# Patient Record
Sex: Male | Born: 1965
Health system: Southern US, Community
[De-identification: ages and names within clinical notes are randomized; demographics above are authoritative.]

## PROBLEM LIST (undated history)

## (undated) DIAGNOSIS — I10 Essential (primary) hypertension: Secondary | ICD-10-CM

## (undated) DIAGNOSIS — M545 Low back pain, unspecified: Secondary | ICD-10-CM

## (undated) DIAGNOSIS — G8929 Other chronic pain: Secondary | ICD-10-CM

## (undated) DIAGNOSIS — M6282 Rhabdomyolysis: Secondary | ICD-10-CM

## (undated) DIAGNOSIS — C801 Malignant (primary) neoplasm, unspecified: Secondary | ICD-10-CM

## (undated) DIAGNOSIS — F329 Major depressive disorder, single episode, unspecified: Secondary | ICD-10-CM

## (undated) DIAGNOSIS — F419 Anxiety disorder, unspecified: Secondary | ICD-10-CM

## (undated) DIAGNOSIS — G4730 Sleep apnea, unspecified: Secondary | ICD-10-CM

## (undated) DIAGNOSIS — K219 Gastro-esophageal reflux disease without esophagitis: Secondary | ICD-10-CM

## (undated) DIAGNOSIS — J449 Chronic obstructive pulmonary disease, unspecified: Secondary | ICD-10-CM

## (undated) DIAGNOSIS — I2510 Atherosclerotic heart disease of native coronary artery without angina pectoris: Secondary | ICD-10-CM

## (undated) DIAGNOSIS — E119 Type 2 diabetes mellitus without complications: Secondary | ICD-10-CM

## (undated) DIAGNOSIS — E785 Hyperlipidemia, unspecified: Secondary | ICD-10-CM

## (undated) DIAGNOSIS — F32A Depression, unspecified: Secondary | ICD-10-CM

## (undated) DIAGNOSIS — M79606 Pain in leg, unspecified: Secondary | ICD-10-CM

## (undated) HISTORY — PX: BACK SURGERY: SHX140

## (undated) HISTORY — DX: Rhabdomyolysis: M62.82

## (undated) HISTORY — PX: COLONOSCOPY W/ POLYPECTOMY: SHX1380

## (undated) HISTORY — PX: EXCISIONAL HEMORRHOIDECTOMY: SHX1541

---

## 1999-04-27 ENCOUNTER — Inpatient Hospital Stay (HOSPITAL_COMMUNITY): Admission: EM | Admit: 1999-04-27 | Discharge: 1999-04-27 | Payer: Self-pay | Admitting: Emergency Medicine

## 2001-10-09 ENCOUNTER — Encounter: Payer: Self-pay | Admitting: Emergency Medicine

## 2001-10-09 ENCOUNTER — Inpatient Hospital Stay (HOSPITAL_COMMUNITY): Admission: EM | Admit: 2001-10-09 | Discharge: 2001-10-10 | Payer: Self-pay | Admitting: Emergency Medicine

## 2001-10-09 HISTORY — PX: CORONARY ANGIOPLASTY WITH STENT PLACEMENT: SHX49

## 2001-12-11 ENCOUNTER — Inpatient Hospital Stay (HOSPITAL_COMMUNITY): Admission: EM | Admit: 2001-12-11 | Discharge: 2001-12-14 | Payer: Self-pay

## 2001-12-13 HISTORY — PX: CORONARY ANGIOPLASTY WITH STENT PLACEMENT: SHX49

## 2002-01-15 ENCOUNTER — Emergency Department (HOSPITAL_COMMUNITY): Admission: EM | Admit: 2002-01-15 | Discharge: 2002-01-15 | Payer: Self-pay | Admitting: Emergency Medicine

## 2002-02-10 ENCOUNTER — Encounter: Payer: Self-pay | Admitting: *Deleted

## 2002-02-11 ENCOUNTER — Inpatient Hospital Stay (HOSPITAL_COMMUNITY): Admission: EM | Admit: 2002-02-11 | Discharge: 2002-02-13 | Payer: Self-pay

## 2002-03-04 ENCOUNTER — Encounter: Payer: Self-pay | Admitting: Emergency Medicine

## 2002-03-04 ENCOUNTER — Inpatient Hospital Stay (HOSPITAL_COMMUNITY): Admission: EM | Admit: 2002-03-04 | Discharge: 2002-03-06 | Payer: Self-pay | Admitting: Emergency Medicine

## 2002-05-03 ENCOUNTER — Encounter: Payer: Self-pay | Admitting: Internal Medicine

## 2002-05-03 ENCOUNTER — Encounter: Admission: RE | Admit: 2002-05-03 | Discharge: 2002-05-03 | Payer: Self-pay | Admitting: Internal Medicine

## 2002-09-05 ENCOUNTER — Emergency Department (HOSPITAL_COMMUNITY): Admission: EM | Admit: 2002-09-05 | Discharge: 2002-09-05 | Payer: Self-pay | Admitting: Emergency Medicine

## 2003-03-19 ENCOUNTER — Inpatient Hospital Stay (HOSPITAL_COMMUNITY): Admission: EM | Admit: 2003-03-19 | Discharge: 2003-03-20 | Payer: Self-pay | Admitting: Emergency Medicine

## 2003-07-09 ENCOUNTER — Emergency Department (HOSPITAL_COMMUNITY): Admission: EM | Admit: 2003-07-09 | Discharge: 2003-07-09 | Payer: Self-pay | Admitting: Emergency Medicine

## 2003-09-10 ENCOUNTER — Emergency Department (HOSPITAL_COMMUNITY): Admission: EM | Admit: 2003-09-10 | Discharge: 2003-09-11 | Payer: Self-pay | Admitting: Emergency Medicine

## 2003-10-03 ENCOUNTER — Ambulatory Visit (HOSPITAL_COMMUNITY): Admission: RE | Admit: 2003-10-03 | Discharge: 2003-10-03 | Payer: Self-pay | Admitting: *Deleted

## 2003-10-10 ENCOUNTER — Ambulatory Visit (HOSPITAL_COMMUNITY): Admission: RE | Admit: 2003-10-10 | Discharge: 2003-10-11 | Payer: Self-pay | Admitting: *Deleted

## 2003-10-10 HISTORY — PX: CORONARY ANGIOPLASTY WITH STENT PLACEMENT: SHX49

## 2003-11-05 ENCOUNTER — Emergency Department (HOSPITAL_COMMUNITY): Admission: AD | Admit: 2003-11-05 | Discharge: 2003-11-05 | Payer: Self-pay | Admitting: Family Medicine

## 2003-12-20 ENCOUNTER — Emergency Department (HOSPITAL_COMMUNITY): Admission: EM | Admit: 2003-12-20 | Discharge: 2003-12-20 | Payer: Self-pay | Admitting: Emergency Medicine

## 2003-12-24 ENCOUNTER — Emergency Department (HOSPITAL_COMMUNITY): Admission: EM | Admit: 2003-12-24 | Discharge: 2003-12-25 | Payer: Self-pay | Admitting: Emergency Medicine

## 2004-04-24 ENCOUNTER — Emergency Department (HOSPITAL_COMMUNITY): Admission: EM | Admit: 2004-04-24 | Discharge: 2004-04-24 | Payer: Self-pay | Admitting: Emergency Medicine

## 2004-07-16 ENCOUNTER — Emergency Department (HOSPITAL_COMMUNITY): Admission: EM | Admit: 2004-07-16 | Discharge: 2004-07-17 | Payer: Self-pay | Admitting: *Deleted

## 2004-08-13 ENCOUNTER — Encounter (INDEPENDENT_AMBULATORY_CARE_PROVIDER_SITE_OTHER): Payer: Self-pay | Admitting: *Deleted

## 2004-08-13 ENCOUNTER — Ambulatory Visit (HOSPITAL_COMMUNITY): Admission: RE | Admit: 2004-08-13 | Discharge: 2004-08-13 | Payer: Self-pay | Admitting: Surgery

## 2004-08-22 ENCOUNTER — Emergency Department (HOSPITAL_COMMUNITY): Admission: EM | Admit: 2004-08-22 | Discharge: 2004-08-22 | Payer: Self-pay | Admitting: Emergency Medicine

## 2004-09-16 ENCOUNTER — Ambulatory Visit: Payer: Self-pay | Admitting: Endocrinology

## 2004-12-07 ENCOUNTER — Emergency Department (HOSPITAL_COMMUNITY): Admission: EM | Admit: 2004-12-07 | Discharge: 2004-12-07 | Payer: Self-pay | Admitting: Emergency Medicine

## 2004-12-10 ENCOUNTER — Ambulatory Visit: Payer: Self-pay | Admitting: Cardiology

## 2004-12-12 ENCOUNTER — Emergency Department (HOSPITAL_COMMUNITY): Admission: EM | Admit: 2004-12-12 | Discharge: 2004-12-12 | Payer: Self-pay | Admitting: Emergency Medicine

## 2005-01-11 ENCOUNTER — Ambulatory Visit: Payer: Self-pay | Admitting: Cardiology

## 2005-04-26 ENCOUNTER — Ambulatory Visit: Payer: Self-pay | Admitting: Endocrinology

## 2005-06-16 ENCOUNTER — Ambulatory Visit: Payer: Self-pay | Admitting: Cardiology

## 2005-09-13 ENCOUNTER — Ambulatory Visit: Payer: Self-pay | Admitting: Cardiology

## 2005-09-13 ENCOUNTER — Emergency Department (HOSPITAL_COMMUNITY): Admission: EM | Admit: 2005-09-13 | Discharge: 2005-09-13 | Payer: Self-pay | Admitting: Emergency Medicine

## 2005-11-18 ENCOUNTER — Ambulatory Visit: Payer: Self-pay | Admitting: Endocrinology

## 2006-01-12 ENCOUNTER — Ambulatory Visit: Payer: Self-pay | Admitting: Cardiology

## 2006-02-08 ENCOUNTER — Ambulatory Visit: Payer: Self-pay | Admitting: Endocrinology

## 2006-02-14 ENCOUNTER — Ambulatory Visit: Payer: Self-pay | Admitting: Endocrinology

## 2006-03-03 ENCOUNTER — Emergency Department (HOSPITAL_COMMUNITY): Admission: EM | Admit: 2006-03-03 | Discharge: 2006-03-03 | Payer: Self-pay | Admitting: Emergency Medicine

## 2006-04-19 ENCOUNTER — Emergency Department (HOSPITAL_COMMUNITY): Admission: EM | Admit: 2006-04-19 | Discharge: 2006-04-19 | Payer: Self-pay | Admitting: Emergency Medicine

## 2006-05-17 ENCOUNTER — Emergency Department (HOSPITAL_COMMUNITY): Admission: EM | Admit: 2006-05-17 | Discharge: 2006-05-17 | Payer: Self-pay | Admitting: Family Medicine

## 2006-05-26 ENCOUNTER — Ambulatory Visit: Payer: Self-pay

## 2006-09-06 ENCOUNTER — Emergency Department (HOSPITAL_COMMUNITY): Admission: EM | Admit: 2006-09-06 | Discharge: 2006-09-06 | Payer: Self-pay | Admitting: Family Medicine

## 2006-09-08 ENCOUNTER — Ambulatory Visit: Payer: Self-pay | Admitting: Endocrinology

## 2006-09-21 ENCOUNTER — Ambulatory Visit: Payer: Self-pay | Admitting: Cardiology

## 2006-09-29 ENCOUNTER — Emergency Department (HOSPITAL_COMMUNITY): Admission: EM | Admit: 2006-09-29 | Discharge: 2006-09-29 | Payer: Self-pay | Admitting: Family Medicine

## 2006-12-18 ENCOUNTER — Emergency Department (HOSPITAL_COMMUNITY): Admission: EM | Admit: 2006-12-18 | Discharge: 2006-12-18 | Payer: Self-pay | Admitting: Emergency Medicine

## 2006-12-18 ENCOUNTER — Ambulatory Visit: Payer: Self-pay | Admitting: *Deleted

## 2006-12-21 ENCOUNTER — Ambulatory Visit: Payer: Self-pay | Admitting: Internal Medicine

## 2006-12-21 LAB — CONVERTED CEMR LAB
Creatinine, Ser: 0.9 mg/dL (ref 0.4–1.5)
GFR calc non Af Amer: 99 mL/min
Glucose, Bld: 106 mg/dL — ABNORMAL HIGH (ref 70–99)
Hgb A1c MFr Bld: 6.3 % — ABNORMAL HIGH (ref 4.6–6.0)
LDL Cholesterol: 75 mg/dL (ref 0–99)
Potassium: 3.6 meq/L (ref 3.5–5.1)
Sodium: 140 meq/L (ref 135–145)
VLDL: 16 mg/dL (ref 0–40)

## 2007-01-26 ENCOUNTER — Ambulatory Visit: Payer: Self-pay | Admitting: Endocrinology

## 2007-03-10 ENCOUNTER — Ambulatory Visit: Payer: Self-pay | Admitting: Cardiology

## 2007-05-08 ENCOUNTER — Ambulatory Visit: Payer: Self-pay | Admitting: Cardiology

## 2007-05-24 ENCOUNTER — Ambulatory Visit: Payer: Self-pay | Admitting: Internal Medicine

## 2007-08-08 ENCOUNTER — Ambulatory Visit: Payer: Self-pay | Admitting: Internal Medicine

## 2007-08-15 ENCOUNTER — Ambulatory Visit: Payer: Self-pay

## 2007-11-07 ENCOUNTER — Telehealth: Payer: Self-pay | Admitting: Internal Medicine

## 2007-12-13 ENCOUNTER — Emergency Department (HOSPITAL_COMMUNITY): Admission: EM | Admit: 2007-12-13 | Discharge: 2007-12-13 | Payer: Self-pay | Admitting: Family Medicine

## 2008-01-01 ENCOUNTER — Emergency Department (HOSPITAL_COMMUNITY): Admission: EM | Admit: 2008-01-01 | Discharge: 2008-01-01 | Payer: Self-pay | Admitting: Emergency Medicine

## 2008-01-10 ENCOUNTER — Telehealth: Payer: Self-pay | Admitting: Endocrinology

## 2008-01-30 ENCOUNTER — Encounter (INDEPENDENT_AMBULATORY_CARE_PROVIDER_SITE_OTHER): Payer: Self-pay | Admitting: *Deleted

## 2008-01-30 ENCOUNTER — Ambulatory Visit: Payer: Self-pay | Admitting: Endocrinology

## 2008-01-30 DIAGNOSIS — E119 Type 2 diabetes mellitus without complications: Secondary | ICD-10-CM | POA: Insufficient documentation

## 2008-01-30 DIAGNOSIS — K625 Hemorrhage of anus and rectum: Secondary | ICD-10-CM

## 2008-01-31 LAB — CONVERTED CEMR LAB: Hgb A1c MFr Bld: 6.4 % — ABNORMAL HIGH (ref 4.6–6.0)

## 2008-02-13 ENCOUNTER — Encounter: Payer: Self-pay | Admitting: Endocrinology

## 2008-02-19 ENCOUNTER — Ambulatory Visit: Payer: Self-pay | Admitting: Internal Medicine

## 2008-02-22 ENCOUNTER — Ambulatory Visit: Payer: Self-pay | Admitting: Internal Medicine

## 2008-03-06 ENCOUNTER — Emergency Department (HOSPITAL_COMMUNITY): Admission: EM | Admit: 2008-03-06 | Discharge: 2008-03-06 | Payer: Self-pay | Admitting: Emergency Medicine

## 2008-04-01 ENCOUNTER — Telehealth (INDEPENDENT_AMBULATORY_CARE_PROVIDER_SITE_OTHER): Payer: Self-pay | Admitting: *Deleted

## 2008-04-17 ENCOUNTER — Encounter: Payer: Self-pay | Admitting: Endocrinology

## 2008-04-18 DIAGNOSIS — Z872 Personal history of diseases of the skin and subcutaneous tissue: Secondary | ICD-10-CM | POA: Insufficient documentation

## 2008-04-18 DIAGNOSIS — I10 Essential (primary) hypertension: Secondary | ICD-10-CM

## 2008-04-18 DIAGNOSIS — E669 Obesity, unspecified: Secondary | ICD-10-CM

## 2008-04-18 DIAGNOSIS — E7800 Pure hypercholesterolemia, unspecified: Secondary | ICD-10-CM

## 2008-04-19 ENCOUNTER — Encounter: Payer: Self-pay | Admitting: Internal Medicine

## 2008-04-21 ENCOUNTER — Emergency Department (HOSPITAL_COMMUNITY): Admission: EM | Admit: 2008-04-21 | Discharge: 2008-04-21 | Payer: Self-pay | Admitting: Emergency Medicine

## 2008-04-24 ENCOUNTER — Encounter: Payer: Self-pay | Admitting: Internal Medicine

## 2008-04-25 ENCOUNTER — Ambulatory Visit: Payer: Self-pay | Admitting: Internal Medicine

## 2008-04-25 DIAGNOSIS — R079 Chest pain, unspecified: Secondary | ICD-10-CM | POA: Insufficient documentation

## 2008-04-25 DIAGNOSIS — M549 Dorsalgia, unspecified: Secondary | ICD-10-CM

## 2008-04-25 DIAGNOSIS — J309 Allergic rhinitis, unspecified: Secondary | ICD-10-CM | POA: Insufficient documentation

## 2008-04-25 DIAGNOSIS — Z860100 Personal history of colon polyps, unspecified: Secondary | ICD-10-CM | POA: Insufficient documentation

## 2008-04-25 DIAGNOSIS — I252 Old myocardial infarction: Secondary | ICD-10-CM

## 2008-04-25 DIAGNOSIS — E785 Hyperlipidemia, unspecified: Secondary | ICD-10-CM | POA: Insufficient documentation

## 2008-05-08 ENCOUNTER — Telehealth: Payer: Self-pay | Admitting: Internal Medicine

## 2008-05-09 ENCOUNTER — Encounter: Payer: Self-pay | Admitting: Internal Medicine

## 2008-05-13 ENCOUNTER — Ambulatory Visit: Payer: Self-pay | Admitting: Internal Medicine

## 2008-05-13 LAB — CONVERTED CEMR LAB
Albumin: 3.6 g/dL (ref 3.5–5.2)
Alkaline Phosphatase: 61 units/L (ref 39–117)
Amylase: 72 units/L (ref 27–131)
Bilirubin, Direct: 0.1 mg/dL (ref 0.0–0.3)
HDL: 25.5 mg/dL — ABNORMAL LOW (ref >39.0)
INR: 1.1 — ABNORMAL HIGH (ref 0.8–1.0)
LDL Cholesterol: 73 mg/dL (ref 0–99)
Lipase: 17 units/L (ref 11.0–59.0)
MCHC: 33.5 g/dL (ref 30.0–36.0)
MCV: 79.1 fL (ref 78.0–100.0)
Platelets: 272 10*3/uL (ref 150–400)
Prothrombin Time: 12.7 s (ref 10.9–13.3)
Total Bilirubin: 0.5 mg/dL (ref 0.3–1.2)
Total CHOL/HDL Ratio: 4.6
Total Protein: 7.4 g/dL (ref 6.0–8.3)
Triglycerides: 96 mg/dL (ref 0–149)

## 2008-06-04 ENCOUNTER — Emergency Department (HOSPITAL_COMMUNITY): Admission: EM | Admit: 2008-06-04 | Discharge: 2008-06-04 | Payer: Self-pay | Admitting: Emergency Medicine

## 2008-06-12 ENCOUNTER — Ambulatory Visit: Payer: Self-pay | Admitting: Internal Medicine

## 2008-06-12 LAB — CONVERTED CEMR LAB
BUN: 10 mg/dL (ref 6–23)
Basophils Absolute: 0.1 10*3/uL (ref 0.0–0.1)
Basophils Relative: 0.8 % (ref 0.0–3.0)
CO2: 28 meq/L (ref 19–32)
Eosinophils Absolute: 0.2 10*3/uL (ref 0.0–0.7)
GFR calc Af Amer: 120 mL/min
GFR calc non Af Amer: 99 mL/min
HCT: 40.2 % (ref 39.0–52.0)
Hemoglobin: 13.4 g/dL (ref 13.0–17.0)
INR: 1 (ref 0.8–1.0)
Monocytes Absolute: 0.7 10*3/uL (ref 0.1–1.0)
Monocytes Relative: 9.8 % (ref 3.0–12.0)
Neutrophils Relative %: 51.2 % (ref 43.0–77.0)
Potassium: 3.5 meq/L (ref 3.5–5.1)
Prothrombin Time: 12.4 s (ref 10.9–13.3)
RBC: 4.98 M/uL (ref 4.22–5.81)
RDW: 13.2 % (ref 11.5–14.6)
Sodium: 142 meq/L (ref 135–145)

## 2008-07-04 ENCOUNTER — Ambulatory Visit: Payer: Self-pay | Admitting: Internal Medicine

## 2008-08-12 ENCOUNTER — Emergency Department (HOSPITAL_COMMUNITY): Admission: EM | Admit: 2008-08-12 | Discharge: 2008-08-12 | Payer: Self-pay | Admitting: Emergency Medicine

## 2008-08-19 ENCOUNTER — Ambulatory Visit: Payer: Self-pay | Admitting: Internal Medicine

## 2008-08-19 DIAGNOSIS — M5010 Cervical disc disorder with radiculopathy, unspecified cervical region: Secondary | ICD-10-CM

## 2008-08-20 ENCOUNTER — Telehealth (INDEPENDENT_AMBULATORY_CARE_PROVIDER_SITE_OTHER): Payer: Self-pay | Admitting: *Deleted

## 2008-09-05 ENCOUNTER — Telehealth (INDEPENDENT_AMBULATORY_CARE_PROVIDER_SITE_OTHER): Payer: Self-pay | Admitting: *Deleted

## 2008-09-05 ENCOUNTER — Encounter: Payer: Self-pay | Admitting: Internal Medicine

## 2008-09-05 ENCOUNTER — Ambulatory Visit: Payer: Self-pay | Admitting: Internal Medicine

## 2008-09-05 DIAGNOSIS — F419 Anxiety disorder, unspecified: Secondary | ICD-10-CM

## 2008-09-05 DIAGNOSIS — F329 Major depressive disorder, single episode, unspecified: Secondary | ICD-10-CM

## 2008-09-05 DIAGNOSIS — K219 Gastro-esophageal reflux disease without esophagitis: Secondary | ICD-10-CM

## 2008-09-05 DIAGNOSIS — G609 Hereditary and idiopathic neuropathy, unspecified: Secondary | ICD-10-CM

## 2008-09-12 ENCOUNTER — Telehealth (INDEPENDENT_AMBULATORY_CARE_PROVIDER_SITE_OTHER): Payer: Self-pay | Admitting: *Deleted

## 2008-09-18 ENCOUNTER — Ambulatory Visit: Payer: Self-pay | Admitting: Internal Medicine

## 2008-09-24 ENCOUNTER — Telehealth (INDEPENDENT_AMBULATORY_CARE_PROVIDER_SITE_OTHER): Payer: Self-pay | Admitting: *Deleted

## 2008-11-25 ENCOUNTER — Ambulatory Visit: Payer: Self-pay | Admitting: Cardiology

## 2008-11-25 ENCOUNTER — Observation Stay (HOSPITAL_COMMUNITY): Admission: EM | Admit: 2008-11-25 | Discharge: 2008-11-26 | Payer: Self-pay | Admitting: Emergency Medicine

## 2008-11-27 ENCOUNTER — Ambulatory Visit: Payer: Self-pay | Admitting: Internal Medicine

## 2008-12-05 ENCOUNTER — Ambulatory Visit: Payer: Self-pay | Admitting: Internal Medicine

## 2008-12-05 DIAGNOSIS — R1013 Epigastric pain: Secondary | ICD-10-CM

## 2008-12-13 ENCOUNTER — Ambulatory Visit: Payer: Self-pay | Admitting: Internal Medicine

## 2008-12-13 LAB — CONVERTED CEMR LAB
Calcium: 9 mg/dL (ref 8.4–10.5)
Eosinophils Absolute: 0.1 10*3/uL (ref 0.0–0.7)
GFR calc Af Amer: 136 mL/min
H Pylori IgG: POSITIVE — AB
Hemoglobin: 14.8 g/dL (ref 13.0–17.0)
MCHC: 34.1 g/dL (ref 30.0–36.0)
RBC: 5.36 M/uL (ref 4.22–5.81)
RDW: 12.7 % (ref 11.5–14.6)
WBC: 7.2 10*3/uL (ref 4.5–10.5)

## 2008-12-17 ENCOUNTER — Telehealth (INDEPENDENT_AMBULATORY_CARE_PROVIDER_SITE_OTHER): Payer: Self-pay | Admitting: *Deleted

## 2008-12-17 ENCOUNTER — Telehealth: Payer: Self-pay | Admitting: Internal Medicine

## 2008-12-18 ENCOUNTER — Ambulatory Visit: Payer: Self-pay | Admitting: Internal Medicine

## 2008-12-18 ENCOUNTER — Encounter (INDEPENDENT_AMBULATORY_CARE_PROVIDER_SITE_OTHER): Payer: Self-pay | Admitting: *Deleted

## 2008-12-30 ENCOUNTER — Ambulatory Visit: Payer: Self-pay | Admitting: Internal Medicine

## 2008-12-30 DIAGNOSIS — R1032 Left lower quadrant pain: Secondary | ICD-10-CM | POA: Insufficient documentation

## 2008-12-30 DIAGNOSIS — K921 Melena: Secondary | ICD-10-CM

## 2008-12-30 DIAGNOSIS — R310 Gross hematuria: Secondary | ICD-10-CM | POA: Insufficient documentation

## 2008-12-31 ENCOUNTER — Telehealth: Payer: Self-pay | Admitting: Internal Medicine

## 2008-12-31 ENCOUNTER — Encounter: Payer: Self-pay | Admitting: Internal Medicine

## 2008-12-31 LAB — CONVERTED CEMR LAB
Amylase: 93 units/L (ref 27–131)
Bacteria, UA: NEGATIVE
Bilirubin Urine: NEGATIVE
CO2: 31 meq/L (ref 19–32)
Chloride: 104 meq/L (ref 96–112)
Eosinophils Absolute: 0.1 10*3/uL (ref 0.0–0.7)
Eosinophils Relative: 1 % (ref 0.0–5.0)
GFR calc non Af Amer: 98 mL/min
Glucose, Bld: 102 mg/dL — ABNORMAL HIGH (ref 70–99)
HCT: 42.4 % (ref 39.0–52.0)
Ketones, ur: NEGATIVE mg/dL
Leukocytes, UA: NEGATIVE
Lymphocytes Relative: 31 % (ref 12.0–46.0)
MCHC: 33.9 g/dL (ref 30.0–36.0)
MCV: 80.8 fL (ref 78.0–100.0)
Neutro Abs: 5.3 10*3/uL (ref 1.4–7.7)
Platelets: 259 10*3/uL (ref 150–400)
Prothrombin Time: 11.4 s (ref 10.9–13.3)
RBC: 5.24 M/uL (ref 4.22–5.81)
RDW: 12.5 % (ref 11.5–14.6)
Sodium: 141 meq/L (ref 135–145)
Specific Gravity, Urine: <=1.005 (ref 1.000–1.03)
Total Bilirubin: 0.7 mg/dL (ref 0.3–1.2)
Total Protein, Urine: NEGATIVE mg/dL
Urine Glucose: NEGATIVE mg/dL
aPTT: 28.8 s (ref 21.7–29.8)

## 2009-01-03 ENCOUNTER — Encounter (INDEPENDENT_AMBULATORY_CARE_PROVIDER_SITE_OTHER): Payer: Self-pay

## 2009-01-23 ENCOUNTER — Ambulatory Visit: Payer: Self-pay | Admitting: Internal Medicine

## 2009-01-24 ENCOUNTER — Telehealth (INDEPENDENT_AMBULATORY_CARE_PROVIDER_SITE_OTHER): Payer: Self-pay | Admitting: *Deleted

## 2009-01-26 ENCOUNTER — Emergency Department (HOSPITAL_COMMUNITY): Admission: EM | Admit: 2009-01-26 | Discharge: 2009-01-27 | Payer: Self-pay | Admitting: Emergency Medicine

## 2009-01-29 ENCOUNTER — Ambulatory Visit: Payer: Self-pay | Admitting: Cardiology

## 2009-01-29 LAB — CONVERTED CEMR LAB
ALT: 24 units/L (ref 0–53)
Albumin: 3.7 g/dL (ref 3.5–5.2)
HDL: 21.6 mg/dL — ABNORMAL LOW (ref >39.0)
Total CHOL/HDL Ratio: 5.9

## 2009-02-02 ENCOUNTER — Ambulatory Visit: Payer: Self-pay | Admitting: *Deleted

## 2009-02-03 ENCOUNTER — Observation Stay (HOSPITAL_COMMUNITY): Admission: EM | Admit: 2009-02-03 | Discharge: 2009-02-03 | Payer: Self-pay | Admitting: Emergency Medicine

## 2009-02-19 ENCOUNTER — Ambulatory Visit: Payer: Self-pay

## 2009-02-27 ENCOUNTER — Encounter: Payer: Self-pay | Admitting: Cardiology

## 2009-02-27 ENCOUNTER — Ambulatory Visit: Payer: Self-pay | Admitting: Cardiology

## 2009-02-27 DIAGNOSIS — F17200 Nicotine dependence, unspecified, uncomplicated: Secondary | ICD-10-CM

## 2009-03-04 ENCOUNTER — Telehealth (INDEPENDENT_AMBULATORY_CARE_PROVIDER_SITE_OTHER): Payer: Self-pay | Admitting: *Deleted

## 2009-03-12 ENCOUNTER — Telehealth: Payer: Self-pay | Admitting: Cardiology

## 2009-03-19 ENCOUNTER — Emergency Department (HOSPITAL_COMMUNITY): Admission: EM | Admit: 2009-03-19 | Discharge: 2009-03-19 | Payer: Self-pay | Admitting: *Deleted

## 2009-03-20 ENCOUNTER — Telehealth: Payer: Self-pay | Admitting: Cardiology

## 2009-03-25 ENCOUNTER — Emergency Department (HOSPITAL_COMMUNITY): Admission: EM | Admit: 2009-03-25 | Discharge: 2009-03-25 | Payer: Self-pay | Admitting: Emergency Medicine

## 2009-03-31 ENCOUNTER — Telehealth: Payer: Self-pay | Admitting: Cardiology

## 2009-04-01 ENCOUNTER — Telehealth: Payer: Self-pay | Admitting: Cardiology

## 2009-04-01 ENCOUNTER — Encounter (INDEPENDENT_AMBULATORY_CARE_PROVIDER_SITE_OTHER): Payer: Self-pay | Admitting: *Deleted

## 2009-05-19 ENCOUNTER — Emergency Department (HOSPITAL_COMMUNITY): Admission: EM | Admit: 2009-05-19 | Discharge: 2009-05-19 | Payer: Self-pay | Admitting: Emergency Medicine

## 2009-06-05 ENCOUNTER — Encounter (INDEPENDENT_AMBULATORY_CARE_PROVIDER_SITE_OTHER): Payer: Self-pay | Admitting: *Deleted

## 2009-06-18 ENCOUNTER — Emergency Department (HOSPITAL_COMMUNITY): Admission: EM | Admit: 2009-06-18 | Discharge: 2009-06-18 | Payer: Self-pay | Admitting: Emergency Medicine

## 2009-06-18 ENCOUNTER — Telehealth: Payer: Self-pay | Admitting: Cardiology

## 2009-06-24 ENCOUNTER — Emergency Department (HOSPITAL_COMMUNITY): Admission: EM | Admit: 2009-06-24 | Discharge: 2009-06-25 | Payer: Self-pay | Admitting: Emergency Medicine

## 2009-06-26 ENCOUNTER — Telehealth: Payer: Self-pay | Admitting: Cardiology

## 2009-06-30 ENCOUNTER — Telehealth (INDEPENDENT_AMBULATORY_CARE_PROVIDER_SITE_OTHER): Payer: Self-pay | Admitting: *Deleted

## 2009-07-08 ENCOUNTER — Telehealth: Payer: Self-pay | Admitting: Cardiology

## 2009-07-24 ENCOUNTER — Encounter: Payer: Self-pay | Admitting: Cardiology

## 2009-07-29 ENCOUNTER — Telehealth (INDEPENDENT_AMBULATORY_CARE_PROVIDER_SITE_OTHER): Payer: Self-pay | Admitting: *Deleted

## 2009-08-01 ENCOUNTER — Telehealth (INDEPENDENT_AMBULATORY_CARE_PROVIDER_SITE_OTHER): Payer: Self-pay | Admitting: *Deleted

## 2009-08-18 ENCOUNTER — Ambulatory Visit: Payer: Self-pay | Admitting: Internal Medicine

## 2009-08-18 ENCOUNTER — Emergency Department (HOSPITAL_COMMUNITY): Admission: EM | Admit: 2009-08-18 | Discharge: 2009-08-18 | Payer: Self-pay | Admitting: Emergency Medicine

## 2009-11-02 ENCOUNTER — Emergency Department (HOSPITAL_COMMUNITY): Admission: EM | Admit: 2009-11-02 | Discharge: 2009-11-02 | Payer: Self-pay | Admitting: Emergency Medicine

## 2010-01-18 ENCOUNTER — Encounter (INDEPENDENT_AMBULATORY_CARE_PROVIDER_SITE_OTHER): Payer: Self-pay | Admitting: Internal Medicine

## 2010-01-18 ENCOUNTER — Inpatient Hospital Stay (HOSPITAL_COMMUNITY): Admission: EM | Admit: 2010-01-18 | Discharge: 2010-01-19 | Payer: Self-pay | Admitting: Emergency Medicine

## 2010-01-18 ENCOUNTER — Ambulatory Visit: Payer: Self-pay | Admitting: Surgery

## 2010-01-23 ENCOUNTER — Emergency Department (HOSPITAL_COMMUNITY): Admission: EM | Admit: 2010-01-23 | Discharge: 2010-01-24 | Payer: Self-pay | Admitting: Emergency Medicine

## 2010-03-30 ENCOUNTER — Emergency Department (HOSPITAL_COMMUNITY): Admission: EM | Admit: 2010-03-30 | Discharge: 2010-03-30 | Payer: Self-pay | Admitting: Emergency Medicine

## 2010-06-02 ENCOUNTER — Ambulatory Visit: Payer: Self-pay | Admitting: Cardiology

## 2010-06-02 ENCOUNTER — Observation Stay (HOSPITAL_COMMUNITY): Admission: EM | Admit: 2010-06-02 | Discharge: 2010-06-04 | Payer: Self-pay | Admitting: Emergency Medicine

## 2010-06-03 ENCOUNTER — Ambulatory Visit: Payer: Self-pay | Admitting: Vascular Surgery

## 2010-06-03 ENCOUNTER — Encounter (INDEPENDENT_AMBULATORY_CARE_PROVIDER_SITE_OTHER): Payer: Self-pay | Admitting: Internal Medicine

## 2010-08-24 ENCOUNTER — Emergency Department (HOSPITAL_COMMUNITY): Admission: EM | Admit: 2010-08-24 | Discharge: 2010-08-24 | Payer: Self-pay | Admitting: Family Medicine

## 2010-08-26 ENCOUNTER — Emergency Department (HOSPITAL_COMMUNITY): Admission: EM | Admit: 2010-08-26 | Discharge: 2010-08-27 | Payer: Self-pay | Admitting: Emergency Medicine

## 2010-12-13 ENCOUNTER — Encounter: Payer: Self-pay | Admitting: Internal Medicine

## 2010-12-30 ENCOUNTER — Emergency Department (HOSPITAL_COMMUNITY): Payer: BC Managed Care – PPO

## 2010-12-30 ENCOUNTER — Emergency Department (HOSPITAL_COMMUNITY)
Admission: EM | Admit: 2010-12-30 | Discharge: 2010-12-30 | Disposition: A | Discharge status: Home / Self Care | Payer: BC Managed Care – PPO | Attending: Emergency Medicine | Admitting: Emergency Medicine

## 2010-12-30 DIAGNOSIS — R059 Cough, unspecified: Secondary | ICD-10-CM | POA: Insufficient documentation

## 2010-12-30 DIAGNOSIS — F411 Generalized anxiety disorder: Secondary | ICD-10-CM | POA: Insufficient documentation

## 2010-12-30 DIAGNOSIS — R05 Cough: Secondary | ICD-10-CM | POA: Insufficient documentation

## 2010-12-30 DIAGNOSIS — I2510 Atherosclerotic heart disease of native coronary artery without angina pectoris: Secondary | ICD-10-CM | POA: Insufficient documentation

## 2010-12-30 DIAGNOSIS — R079 Chest pain, unspecified: Secondary | ICD-10-CM | POA: Insufficient documentation

## 2010-12-30 DIAGNOSIS — E119 Type 2 diabetes mellitus without complications: Secondary | ICD-10-CM | POA: Insufficient documentation

## 2010-12-30 DIAGNOSIS — Z79899 Other long term (current) drug therapy: Secondary | ICD-10-CM | POA: Insufficient documentation

## 2010-12-30 DIAGNOSIS — E789 Disorder of lipoprotein metabolism, unspecified: Secondary | ICD-10-CM | POA: Insufficient documentation

## 2010-12-30 DIAGNOSIS — I252 Old myocardial infarction: Secondary | ICD-10-CM | POA: Insufficient documentation

## 2010-12-30 DIAGNOSIS — I10 Essential (primary) hypertension: Secondary | ICD-10-CM | POA: Insufficient documentation

## 2010-12-30 DIAGNOSIS — J40 Bronchitis, not specified as acute or chronic: Secondary | ICD-10-CM | POA: Insufficient documentation

## 2010-12-30 LAB — URINALYSIS, ROUTINE W REFLEX MICROSCOPIC
Ketones, ur: NEGATIVE mg/dL
Nitrite: NEGATIVE
Protein, ur: NEGATIVE mg/dL

## 2010-12-30 LAB — DIFFERENTIAL
Basophils Absolute: 0 10*3/uL (ref 0.0–0.1)
Eosinophils Relative: 2 % (ref 0–5)
Lymphocytes Relative: 39 % (ref 12–46)
Lymphs Abs: 3.1 10*3/uL (ref 0.7–4.0)
Monocytes Absolute: 0.6 10*3/uL (ref 0.1–1.0)
Neutro Abs: 3.9 10*3/uL (ref 1.7–7.7)

## 2010-12-30 LAB — BASIC METABOLIC PANEL
BUN: 6 mg/dL (ref 6–23)
CO2: 27 mEq/L (ref 19–32)
Chloride: 102 mEq/L (ref 96–112)
Creatinine, Ser: 0.92 mg/dL (ref 0.4–1.5)
GFR calc Af Amer: >60 mL/min (ref >60)
Potassium: 3.4 mEq/L — ABNORMAL LOW (ref 3.5–5.1)

## 2010-12-30 LAB — CK TOTAL AND CKMB (NOT AT ARMC)
CK, MB: 2 ng/mL (ref 0.3–4.0)
Total CK: 441 U/L — ABNORMAL HIGH (ref 7–232)

## 2010-12-30 LAB — CBC
HCT: 41.5 % (ref 39.0–52.0)
Hemoglobin: 14 g/dL (ref 13.0–17.0)
MCHC: 33.7 g/dL (ref 30.0–36.0)
MCV: 79.3 fL (ref 78.0–100.0)
RDW: 13.3 % (ref 11.5–15.5)

## 2010-12-30 LAB — PROTIME-INR: INR: 1 (ref 0.00–1.49)

## 2010-12-30 LAB — TROPONIN I: Troponin I: 0.01 ng/mL (ref 0.00–0.06)

## 2011-02-03 LAB — POCT I-STAT, CHEM 8
BUN: 8 mg/dL (ref 6–23)
Calcium, Ion: 1.13 mmol/L (ref 1.12–1.32)
Chloride: 102 mEq/L (ref 96–112)
Chloride: 107 mEq/L (ref 96–112)
HCT: 48 % (ref 39.0–52.0)
Potassium: 3.7 mEq/L (ref 3.5–5.1)
Potassium: 3.9 mEq/L (ref 3.5–5.1)
Sodium: 142 mEq/L (ref 135–145)
Sodium: 142 mEq/L (ref 135–145)
TCO2: 29 mmol/L (ref 0–100)

## 2011-02-03 LAB — CBC
MCHC: 33.8 g/dL (ref 30.0–36.0)
Platelets: 237 10*3/uL (ref 150–400)
RDW: 13.8 % (ref 11.5–15.5)
WBC: 9.4 10*3/uL (ref 4.0–10.5)

## 2011-02-03 LAB — DIFFERENTIAL
Basophils Absolute: 0 10*3/uL (ref 0.0–0.1)
Basophils Relative: 0 % (ref 0–1)
Lymphocytes Relative: 51 % — ABNORMAL HIGH (ref 12–46)
Neutro Abs: 3.5 10*3/uL (ref 1.7–7.7)
Neutrophils Relative %: 38 % — ABNORMAL LOW (ref 43–77)

## 2011-02-03 LAB — GLUCOSE, CAPILLARY: Glucose-Capillary: 104 mg/dL — ABNORMAL HIGH (ref 70–99)

## 2011-02-07 LAB — COMPREHENSIVE METABOLIC PANEL
ALT: 21 U/L (ref 0–53)
Alkaline Phosphatase: 86 U/L (ref 39–117)
CO2: 32 mEq/L (ref 19–32)
Chloride: 105 mEq/L (ref 96–112)
GFR calc non Af Amer: >60 mL/min (ref >60)
Glucose, Bld: 85 mg/dL (ref 70–99)
Potassium: 3.9 mEq/L (ref 3.5–5.1)
Sodium: 140 mEq/L (ref 135–145)

## 2011-02-07 LAB — APTT: aPTT: 28 seconds (ref 24–37)

## 2011-02-07 LAB — LIPID PANEL
Cholesterol: 112 mg/dL (ref 0–200)
HDL: 20 mg/dL — ABNORMAL LOW (ref >39)
Triglycerides: 267 mg/dL — ABNORMAL HIGH (ref <150)

## 2011-02-07 LAB — RAPID URINE DRUG SCREEN, HOSP PERFORMED
Barbiturates: NOT DETECTED
Benzodiazepines: NOT DETECTED
Cocaine: NOT DETECTED

## 2011-02-07 LAB — CBC
MCV: 81 fL (ref 78.0–100.0)
Platelets: 205 10*3/uL (ref 150–400)
RBC: 5.5 MIL/uL (ref 4.22–5.81)
WBC: 8.9 10*3/uL (ref 4.0–10.5)

## 2011-02-07 LAB — GLUCOSE, CAPILLARY
Glucose-Capillary: 123 mg/dL — ABNORMAL HIGH (ref 70–99)
Glucose-Capillary: 124 mg/dL — ABNORMAL HIGH (ref 70–99)
Glucose-Capillary: 127 mg/dL — ABNORMAL HIGH (ref 70–99)
Glucose-Capillary: 139 mg/dL — ABNORMAL HIGH (ref 70–99)
Glucose-Capillary: 94 mg/dL (ref 70–99)
Glucose-Capillary: 97 mg/dL (ref 70–99)

## 2011-02-07 LAB — DIFFERENTIAL
Lymphocytes Relative: 44 % (ref 12–46)
Lymphs Abs: 3.9 10*3/uL (ref 0.7–4.0)
Neutrophils Relative %: 44 % (ref 43–77)

## 2011-02-07 LAB — BASIC METABOLIC PANEL
Calcium: 9 mg/dL (ref 8.4–10.5)
Chloride: 106 mEq/L (ref 96–112)
Creatinine, Ser: 0.97 mg/dL (ref 0.4–1.5)
GFR calc Af Amer: >60 mL/min (ref >60)
Sodium: 141 mEq/L (ref 135–145)

## 2011-02-07 LAB — HEMOGLOBIN A1C: Hgb A1c MFr Bld: 6.1 % — ABNORMAL HIGH (ref <5.7)

## 2011-02-10 LAB — CBC
HCT: 44.7 % (ref 39.0–52.0)
Hemoglobin: 14.8 g/dL (ref 13.0–17.0)
MCHC: 33 g/dL (ref 30.0–36.0)
MCHC: 33.6 g/dL (ref 30.0–36.0)
MCV: 81.1 fL (ref 78.0–100.0)
MCV: 81.3 fL (ref 78.0–100.0)
Platelets: 154 10*3/uL (ref 150–400)
Platelets: 176 K/uL (ref 150–400)
RBC: 5.51 MIL/uL (ref 4.22–5.81)
RDW: 13.9 % (ref 11.5–15.5)
WBC: 5.7 K/uL (ref 4.0–10.5)
WBC: 5.9 10*3/uL (ref 4.0–10.5)

## 2011-02-10 LAB — TROPONIN I

## 2011-02-10 LAB — URINALYSIS, ROUTINE W REFLEX MICROSCOPIC
Bilirubin Urine: NEGATIVE
Glucose, UA: NEGATIVE mg/dL
Hgb urine dipstick: NEGATIVE
Ketones, ur: 15 mg/dL — AB
pH: 6 (ref 5.0–8.0)

## 2011-02-10 LAB — D-DIMER, QUANTITATIVE: D-Dimer, Quant: 0.45 ug/mL-FEU (ref 0.00–0.48)

## 2011-02-10 LAB — GLUCOSE, CAPILLARY
Glucose-Capillary: 139 mg/dL — ABNORMAL HIGH (ref 70–99)
Glucose-Capillary: 85 mg/dL (ref 70–99)

## 2011-02-10 LAB — URINALYSIS, MICROSCOPIC ONLY
Bilirubin Urine: NEGATIVE
Glucose, UA: NEGATIVE mg/dL
Hgb urine dipstick: NEGATIVE
Nitrite: NEGATIVE
Specific Gravity, Urine: 1.024 (ref 1.005–1.030)
pH: 6 (ref 5.0–8.0)

## 2011-02-10 LAB — DIFFERENTIAL
Basophils Absolute: 0 K/uL (ref 0.0–0.1)
Basophils Relative: 0 % (ref 0–1)
Basophils Relative: 0 % (ref 0–1)
Eosinophils Absolute: 0 10*3/uL (ref 0.0–0.7)
Eosinophils Absolute: 0 K/uL (ref 0.0–0.7)
Eosinophils Relative: 1 % (ref 0–5)
Lymphocytes Relative: 21 % (ref 12–46)
Lymphs Abs: 1.2 K/uL (ref 0.7–4.0)
Monocytes Absolute: 0.7 10*3/uL (ref 0.1–1.0)
Monocytes Absolute: 0.8 K/uL (ref 0.1–1.0)
Monocytes Relative: 11 % (ref 3–12)
Monocytes Relative: 15 % — ABNORMAL HIGH (ref 3–12)
Neutro Abs: 3.6 K/uL (ref 1.7–7.7)
Neutrophils Relative %: 54 % (ref 43–77)
Neutrophils Relative %: 63 % (ref 43–77)

## 2011-02-10 LAB — LIPID PANEL
HDL: 17 mg/dL — ABNORMAL LOW (ref >39)
Total CHOL/HDL Ratio: 7.9 RATIO

## 2011-02-10 LAB — CULTURE, BLOOD (ROUTINE X 2)
Culture: NO GROWTH
Culture: NO GROWTH
Culture: NO GROWTH

## 2011-02-10 LAB — RAPID URINE DRUG SCREEN, HOSP PERFORMED
Amphetamines: NOT DETECTED
Barbiturates: NOT DETECTED
Benzodiazepines: NOT DETECTED
Cocaine: NOT DETECTED
Opiates: POSITIVE — AB
Tetrahydrocannabinol: NOT DETECTED

## 2011-02-10 LAB — BASIC METABOLIC PANEL WITH GFR
BUN: 11 mg/dL (ref 6–23)
CO2: 28 meq/L (ref 19–32)
Calcium: 8.3 mg/dL — ABNORMAL LOW (ref 8.4–10.5)
Chloride: 103 meq/L (ref 96–112)
Creatinine, Ser: 0.92 mg/dL (ref 0.4–1.5)
GFR calc non Af Amer: >60 mL/min
Glucose, Bld: 99 mg/dL (ref 70–99)
Potassium: 3.4 meq/L — ABNORMAL LOW (ref 3.5–5.1)
Sodium: 140 meq/L (ref 135–145)

## 2011-02-10 LAB — PROTIME-INR: Prothrombin Time: 13.2 seconds (ref 11.6–15.2)

## 2011-02-10 LAB — BASIC METABOLIC PANEL
BUN: 6 mg/dL (ref 6–23)
Chloride: 107 mEq/L (ref 96–112)
Creatinine, Ser: 0.91 mg/dL (ref 0.4–1.5)

## 2011-02-10 LAB — MAGNESIUM: Magnesium: 1.8 mg/dL (ref 1.5–2.5)

## 2011-02-10 LAB — PHOSPHORUS: Phosphorus: 3.2 mg/dL (ref 2.3–4.6)

## 2011-02-10 LAB — CK TOTAL AND CKMB (NOT AT ARMC)
CK, MB: 1.1 ng/mL (ref 0.3–4.0)
Relative Index: 0.1 (ref 0.0–2.5)
Total CK: 1280 U/L — ABNORMAL HIGH (ref 7–232)
Total CK: 898 U/L — ABNORMAL HIGH (ref 7–232)

## 2011-02-10 LAB — POCT CARDIAC MARKERS: Myoglobin, poc: 197 ng/mL (ref 12–200)

## 2011-02-10 LAB — CK: Total CK: 1568 U/L — ABNORMAL HIGH (ref 7–232)

## 2011-02-10 LAB — APTT: aPTT: 32 s (ref 24–37)

## 2011-02-14 LAB — BASIC METABOLIC PANEL
CO2: 28 mEq/L (ref 19–32)
Calcium: 8.6 mg/dL (ref 8.4–10.5)
Chloride: 102 mEq/L (ref 96–112)
Glucose, Bld: 89 mg/dL (ref 70–99)
Sodium: 138 mEq/L (ref 135–145)

## 2011-02-14 LAB — URINALYSIS, ROUTINE W REFLEX MICROSCOPIC
Nitrite: NEGATIVE
Specific Gravity, Urine: 1.024 (ref 1.005–1.030)
Urobilinogen, UA: 1 mg/dL (ref 0.0–1.0)

## 2011-02-14 LAB — URINE MICROSCOPIC-ADD ON

## 2011-02-14 LAB — CK TOTAL AND CKMB (NOT AT ARMC): CK, MB: 1.6 ng/mL (ref 0.3–4.0)

## 2011-02-14 LAB — DIFFERENTIAL
Basophils Absolute: 0.1 10*3/uL (ref 0.0–0.1)
Basophils Relative: 1 % (ref 0–1)
Eosinophils Relative: 2 % (ref 0–5)
Lymphs Abs: 2.8 10*3/uL (ref 0.7–4.0)
Monocytes Relative: 12 % (ref 3–12)
Neutro Abs: 4 10*3/uL (ref 1.7–7.7)

## 2011-02-14 LAB — CBC
Hemoglobin: 14.3 g/dL (ref 13.0–17.0)
MCHC: 32.9 g/dL (ref 30.0–36.0)
MCV: 81.6 fL (ref 78.0–100.0)
RDW: 13.2 % (ref 11.5–15.5)

## 2011-02-26 LAB — URINALYSIS, ROUTINE W REFLEX MICROSCOPIC
Hgb urine dipstick: NEGATIVE
Specific Gravity, Urine: 1.021 (ref 1.005–1.030)
Urobilinogen, UA: 1 mg/dL (ref 0.0–1.0)
pH: 7.5 (ref 5.0–8.0)

## 2011-02-26 LAB — CK TOTAL AND CKMB (NOT AT ARMC)
Relative Index: 0.4 (ref 0.0–2.5)
Total CK: 189 U/L (ref 7–232)

## 2011-02-26 LAB — DIFFERENTIAL
Basophils Absolute: 0.1 10*3/uL (ref 0.0–0.1)
Eosinophils Absolute: 0.2 10*3/uL (ref 0.0–0.7)
Eosinophils Relative: 2 % (ref 0–5)
Monocytes Absolute: 0.9 10*3/uL (ref 0.1–1.0)

## 2011-02-26 LAB — URINE MICROSCOPIC-ADD ON

## 2011-02-26 LAB — BASIC METABOLIC PANEL
BUN: 6 mg/dL (ref 6–23)
CO2: 27 mEq/L (ref 19–32)
Chloride: 107 mEq/L (ref 96–112)
Glucose, Bld: 91 mg/dL (ref 70–99)
Potassium: 3.6 mEq/L (ref 3.5–5.1)

## 2011-02-26 LAB — CBC
HCT: 46 % (ref 39.0–52.0)
Hemoglobin: 15.2 g/dL (ref 13.0–17.0)
MCV: 81.6 fL (ref 78.0–100.0)
Platelets: 230 10*3/uL (ref 150–400)
RDW: 13.8 % (ref 11.5–15.5)

## 2011-02-26 LAB — TROPONIN I: Troponin I: 0.02 ng/mL (ref 0.00–0.06)

## 2011-02-26 LAB — POCT CARDIAC MARKERS: Troponin i, poc: <0.05 ng/mL (ref 0.00–0.09)

## 2011-02-27 LAB — DIFFERENTIAL
Basophils Absolute: 0 10*3/uL (ref 0.0–0.1)
Basophils Relative: 1 % (ref 0–1)
Neutro Abs: 3 10*3/uL (ref 1.7–7.7)
Neutrophils Relative %: 41 % — ABNORMAL LOW (ref 43–77)

## 2011-02-27 LAB — POCT I-STAT, CHEM 8
Chloride: 104 mEq/L (ref 96–112)
HCT: 47 % (ref 39.0–52.0)
Potassium: 3.5 mEq/L (ref 3.5–5.1)

## 2011-02-27 LAB — CBC
MCHC: 33.1 g/dL (ref 30.0–36.0)
RBC: 5.46 MIL/uL (ref 4.22–5.81)
RDW: 13.8 % (ref 11.5–15.5)

## 2011-02-27 LAB — POCT CARDIAC MARKERS
CKMB, poc: <1 ng/mL — ABNORMAL LOW (ref 1.0–8.0)
Myoglobin, poc: 85.3 ng/mL (ref 12–200)
Troponin i, poc: <0.05 ng/mL (ref 0.00–0.09)

## 2011-03-01 LAB — COMPREHENSIVE METABOLIC PANEL
Albumin: 3.5 g/dL (ref 3.5–5.2)
BUN: 10 mg/dL (ref 6–23)
Chloride: 106 mEq/L (ref 96–112)
Creatinine, Ser: 0.77 mg/dL (ref 0.4–1.5)
GFR calc non Af Amer: >60 mL/min (ref >60)
Total Bilirubin: 0.4 mg/dL (ref 0.3–1.2)

## 2011-03-01 LAB — POCT CARDIAC MARKERS
CKMB, poc: <1 ng/mL — ABNORMAL LOW (ref 1.0–8.0)
Myoglobin, poc: 83 ng/mL (ref 12–200)

## 2011-03-01 LAB — CBC
HCT: 42.3 % (ref 39.0–52.0)
MCV: 82.2 fL (ref 78.0–100.0)
Platelets: 227 10*3/uL (ref 150–400)
WBC: 8.4 10*3/uL (ref 4.0–10.5)

## 2011-03-01 LAB — POCT I-STAT, CHEM 8
BUN: 12 mg/dL (ref 6–23)
Calcium, Ion: 1.06 mmol/L — ABNORMAL LOW (ref 1.12–1.32)
HCT: 44 % (ref 39.0–52.0)
TCO2: 25 mmol/L (ref 0–100)

## 2011-03-01 LAB — DIFFERENTIAL
Basophils Absolute: 0 10*3/uL (ref 0.0–0.1)
Lymphocytes Relative: 39 % (ref 12–46)
Monocytes Absolute: 0.8 10*3/uL (ref 0.1–1.0)
Neutro Abs: 4.1 10*3/uL (ref 1.7–7.7)
Neutrophils Relative %: 49 % (ref 43–77)

## 2011-03-01 LAB — LIPASE, BLOOD: Lipase: 22 U/L (ref 11–59)

## 2011-03-02 LAB — COMPREHENSIVE METABOLIC PANEL
AST: 21 U/L (ref 0–37)
BUN: 9 mg/dL (ref 6–23)
CO2: 28 mEq/L (ref 19–32)
Calcium: 8.3 mg/dL — ABNORMAL LOW (ref 8.4–10.5)
Creatinine, Ser: 1 mg/dL (ref 0.4–1.5)
GFR calc Af Amer: >60 mL/min (ref >60)
GFR calc non Af Amer: >60 mL/min (ref >60)

## 2011-03-02 LAB — DIFFERENTIAL
Basophils Relative: 0 % (ref 0–1)
Eosinophils Absolute: 0.1 10*3/uL (ref 0.0–0.7)
Eosinophils Relative: 1 % (ref 0–5)
Neutrophils Relative %: 77 % (ref 43–77)

## 2011-03-02 LAB — URINALYSIS, ROUTINE W REFLEX MICROSCOPIC
Hgb urine dipstick: NEGATIVE
Ketones, ur: 15 mg/dL — AB
Protein, ur: NEGATIVE mg/dL
Urobilinogen, UA: 1 mg/dL (ref 0.0–1.0)

## 2011-03-02 LAB — CBC
MCHC: 33.5 g/dL (ref 30.0–36.0)
MCV: 80.8 fL (ref 78.0–100.0)
Platelets: 224 10*3/uL (ref 150–400)

## 2011-03-02 LAB — LIPASE, BLOOD: Lipase: 20 U/L (ref 11–59)

## 2011-03-03 ENCOUNTER — Emergency Department (HOSPITAL_COMMUNITY)
Admission: EM | Admit: 2011-03-03 | Discharge: 2011-03-03 | Disposition: A | Discharge status: Home / Self Care | Payer: BC Managed Care – PPO | Attending: Emergency Medicine | Admitting: Emergency Medicine

## 2011-03-03 ENCOUNTER — Emergency Department (HOSPITAL_COMMUNITY): Payer: BC Managed Care – PPO

## 2011-03-03 DIAGNOSIS — R059 Cough, unspecified: Secondary | ICD-10-CM | POA: Insufficient documentation

## 2011-03-03 DIAGNOSIS — R0789 Other chest pain: Secondary | ICD-10-CM | POA: Insufficient documentation

## 2011-03-03 DIAGNOSIS — R072 Precordial pain: Secondary | ICD-10-CM | POA: Insufficient documentation

## 2011-03-03 DIAGNOSIS — R05 Cough: Secondary | ICD-10-CM | POA: Insufficient documentation

## 2011-03-03 DIAGNOSIS — E119 Type 2 diabetes mellitus without complications: Secondary | ICD-10-CM | POA: Insufficient documentation

## 2011-03-03 DIAGNOSIS — E7800 Pure hypercholesterolemia, unspecified: Secondary | ICD-10-CM | POA: Insufficient documentation

## 2011-03-03 DIAGNOSIS — I10 Essential (primary) hypertension: Secondary | ICD-10-CM | POA: Insufficient documentation

## 2011-03-03 DIAGNOSIS — I2510 Atherosclerotic heart disease of native coronary artery without angina pectoris: Secondary | ICD-10-CM | POA: Insufficient documentation

## 2011-03-03 DIAGNOSIS — J3489 Other specified disorders of nose and nasal sinuses: Secondary | ICD-10-CM | POA: Insufficient documentation

## 2011-03-03 DIAGNOSIS — I252 Old myocardial infarction: Secondary | ICD-10-CM | POA: Insufficient documentation

## 2011-03-03 DIAGNOSIS — R110 Nausea: Secondary | ICD-10-CM | POA: Insufficient documentation

## 2011-03-03 LAB — CBC
HCT: 43.7 % (ref 39.0–52.0)
Hemoglobin: 15 g/dL (ref 13.0–17.0)
Hemoglobin: 15.2 g/dL (ref 13.0–17.0)
MCV: 79 fL (ref 78.0–100.0)
RBC: 5.53 MIL/uL (ref 4.22–5.81)
RBC: 5.57 MIL/uL (ref 4.22–5.81)
RDW: 13.8 % (ref 11.5–15.5)
WBC: 9.8 10*3/uL (ref 4.0–10.5)
WBC: 8.5 10*3/uL (ref 4.0–10.5)

## 2011-03-03 LAB — POCT CARDIAC MARKERS
CKMB, poc: <1 ng/mL — ABNORMAL LOW (ref 1.0–8.0)
CKMB, poc: 1 ng/mL (ref 1.0–8.0)
Myoglobin, poc: 81.3 ng/mL (ref 12–200)
Troponin i, poc: <0.05 ng/mL (ref 0.00–0.09)
Troponin i, poc: <0.05 ng/mL (ref 0.00–0.09)

## 2011-03-03 LAB — DIFFERENTIAL
Basophils Absolute: 0 10*3/uL (ref 0.0–0.1)
Basophils Relative: 2 % — ABNORMAL HIGH (ref 0–1)
Eosinophils Relative: 2 % (ref 0–5)
Lymphocytes Relative: 38 % (ref 12–46)
Lymphocytes Relative: 29 % (ref 12–46)
Lymphs Abs: 3.7 10*3/uL (ref 0.7–4.0)
Lymphs Abs: 2.4 10*3/uL (ref 0.7–4.0)
Monocytes Absolute: 1 10*3/uL (ref 0.1–1.0)
Monocytes Relative: 12 % (ref 3–12)
Neutro Abs: 5.1 10*3/uL (ref 1.7–7.7)
Neutro Abs: 4.7 10*3/uL (ref 1.7–7.7)
Neutrophils Relative %: 56 % (ref 43–77)

## 2011-03-03 LAB — POCT I-STAT, CHEM 8
BUN: 12 mg/dL (ref 6–23)
Calcium, Ion: 1.11 mmol/L — ABNORMAL LOW (ref 1.12–1.32)
Chloride: 108 mEq/L (ref 96–112)
Glucose, Bld: 114 mg/dL — ABNORMAL HIGH (ref 70–99)
HCT: 47 % (ref 39.0–52.0)
Potassium: 3.9 mEq/L (ref 3.5–5.1)

## 2011-03-04 LAB — BASIC METABOLIC PANEL
BUN: 9 mg/dL (ref 6–23)
CO2: 28 mEq/L (ref 19–32)
CO2: 28 mEq/L (ref 19–32)
Calcium: 9.1 mg/dL (ref 8.4–10.5)
Chloride: 104 mEq/L (ref 96–112)
Creatinine, Ser: 0.81 mg/dL (ref 0.4–1.5)
Creatinine, Ser: 0.88 mg/dL (ref 0.4–1.5)
GFR calc Af Amer: >60 mL/min (ref >60)
GFR calc non Af Amer: >60 mL/min (ref >60)
Glucose, Bld: 91 mg/dL (ref 70–99)

## 2011-03-04 LAB — CBC
HCT: 41.3 % (ref 39.0–52.0)
MCHC: 33.5 g/dL (ref 30.0–36.0)
MCV: 81.8 fL (ref 78.0–100.0)
RBC: 5.05 MIL/uL (ref 4.22–5.81)
RBC: 5.27 MIL/uL (ref 4.22–5.81)
WBC: 5.5 10*3/uL (ref 4.0–10.5)
WBC: 8.5 10*3/uL (ref 4.0–10.5)

## 2011-03-04 LAB — POCT CARDIAC MARKERS
Myoglobin, poc: 73.6 ng/mL (ref 12–200)
Troponin i, poc: <0.05 ng/mL (ref 0.00–0.09)

## 2011-03-04 LAB — POCT I-STAT, CHEM 8
Creatinine, Ser: 0.8 mg/dL (ref 0.4–1.5)
Glucose, Bld: 84 mg/dL (ref 70–99)
Hemoglobin: 15.3 g/dL (ref 13.0–17.0)
Sodium: 140 mEq/L (ref 135–145)
TCO2: 26 mmol/L (ref 0–100)

## 2011-03-04 LAB — DIFFERENTIAL
Basophils Absolute: 0.1 10*3/uL (ref 0.0–0.1)
Basophils Relative: 1 % (ref 0–1)
Monocytes Absolute: 1.1 10*3/uL — ABNORMAL HIGH (ref 0.1–1.0)
Neutro Abs: 4.2 10*3/uL (ref 1.7–7.7)

## 2011-03-04 LAB — BRAIN NATRIURETIC PEPTIDE: Pro B Natriuretic peptide (BNP): <30 pg/mL (ref 0.0–100.0)

## 2011-03-04 LAB — GLUCOSE, CAPILLARY: Glucose-Capillary: 100 mg/dL — ABNORMAL HIGH (ref 70–99)

## 2011-03-04 LAB — TSH: TSH: 2.414 u[IU]/mL (ref 0.350–4.500)

## 2011-03-04 LAB — CK TOTAL AND CKMB (NOT AT ARMC)
CK, MB: 2.4 ng/mL (ref 0.3–4.0)
Relative Index: 0.4 (ref 0.0–2.5)
Total CK: 591 U/L — ABNORMAL HIGH (ref 7–232)

## 2011-03-04 LAB — PROTIME-INR: Prothrombin Time: 13.8 seconds (ref 11.6–15.2)

## 2011-03-08 LAB — POCT I-STAT, CHEM 8
Chloride: 104 mEq/L (ref 96–112)
HCT: 44 % (ref 39.0–52.0)
Hemoglobin: 15 g/dL (ref 13.0–17.0)
Potassium: 3.7 mEq/L (ref 3.5–5.1)
Sodium: 142 mEq/L (ref 135–145)

## 2011-03-08 LAB — COMPREHENSIVE METABOLIC PANEL
Albumin: 3.4 g/dL — ABNORMAL LOW (ref 3.5–5.2)
BUN: 7 mg/dL (ref 6–23)
CO2: 26 mEq/L (ref 19–32)
Chloride: 105 mEq/L (ref 96–112)
Creatinine, Ser: 0.83 mg/dL (ref 0.4–1.5)
GFR calc non Af Amer: >60 mL/min (ref >60)
Glucose, Bld: 98 mg/dL (ref 70–99)
Total Bilirubin: 0.6 mg/dL (ref 0.3–1.2)

## 2011-03-08 LAB — LIPID PANEL
Cholesterol: 120 mg/dL (ref 0–200)
LDL Cholesterol: 55 mg/dL (ref 0–99)
Total CHOL/HDL Ratio: 6.7 RATIO
Triglycerides: 235 mg/dL — ABNORMAL HIGH (ref <150)

## 2011-03-08 LAB — POCT CARDIAC MARKERS
CKMB, poc: <1 ng/mL — ABNORMAL LOW (ref 1.0–8.0)
Myoglobin, poc: 69.2 ng/mL (ref 12–200)
Troponin i, poc: <0.05 ng/mL (ref 0.00–0.09)

## 2011-03-08 LAB — CK TOTAL AND CKMB (NOT AT ARMC): Relative Index: 0.6 (ref 0.0–2.5)

## 2011-03-08 LAB — BASIC METABOLIC PANEL
BUN: 9 mg/dL (ref 6–23)
CO2: 26 mEq/L (ref 19–32)
Calcium: 8.6 mg/dL (ref 8.4–10.5)
Creatinine, Ser: 0.76 mg/dL (ref 0.4–1.5)
GFR calc non Af Amer: >60 mL/min (ref >60)
Glucose, Bld: 81 mg/dL (ref 70–99)
Sodium: 138 mEq/L (ref 135–145)

## 2011-03-08 LAB — DIFFERENTIAL
Basophils Absolute: 0 10*3/uL (ref 0.0–0.1)
Basophils Relative: 1 % (ref 0–1)
Eosinophils Relative: 2 % (ref 0–5)
Lymphocytes Relative: 29 % (ref 12–46)

## 2011-03-08 LAB — CARDIAC PANEL(CRET KIN+CKTOT+MB+TROPI)
CK, MB: 0.9 ng/mL (ref 0.3–4.0)
Total CK: 190 U/L (ref 7–232)

## 2011-03-08 LAB — TROPONIN I: Troponin I: <0.01 ng/mL (ref 0.00–0.06)

## 2011-03-08 LAB — GLUCOSE, CAPILLARY
Glucose-Capillary: 104 mg/dL — ABNORMAL HIGH (ref 70–99)
Glucose-Capillary: 84 mg/dL (ref 70–99)

## 2011-03-08 LAB — AMYLASE: Amylase: 83 U/L (ref 27–131)

## 2011-03-08 LAB — LIPASE, BLOOD: Lipase: 24 U/L (ref 11–59)

## 2011-03-08 LAB — BRAIN NATRIURETIC PEPTIDE: Pro B Natriuretic peptide (BNP): 64 pg/mL (ref 0.0–100.0)

## 2011-03-08 LAB — CBC
HCT: 41.6 % (ref 39.0–52.0)
Platelets: 257 10*3/uL (ref 150–400)
RDW: 13.5 % (ref 11.5–15.5)

## 2011-04-06 ENCOUNTER — Inpatient Hospital Stay (INDEPENDENT_AMBULATORY_CARE_PROVIDER_SITE_OTHER)
Admission: RE | Admit: 2011-04-06 | Discharge: 2011-04-06 | Disposition: A | Discharge status: Home / Self Care | Payer: BC Managed Care – PPO | Source: Ambulatory Visit | Attending: Family Medicine | Admitting: Family Medicine

## 2011-04-06 ENCOUNTER — Ambulatory Visit (INDEPENDENT_AMBULATORY_CARE_PROVIDER_SITE_OTHER): Payer: BC Managed Care – PPO

## 2011-04-06 DIAGNOSIS — S20229A Contusion of unspecified back wall of thorax, initial encounter: Secondary | ICD-10-CM

## 2011-04-06 DIAGNOSIS — S335XXA Sprain of ligaments of lumbar spine, initial encounter: Secondary | ICD-10-CM

## 2011-04-06 DIAGNOSIS — M799 Soft tissue disorder, unspecified: Secondary | ICD-10-CM

## 2011-04-06 NOTE — Assessment & Plan Note (Signed)
[facility] HEALTHCARE                                 ON-CALL NOTE   GRANVEL, PROUDFOOT                     MRN:          [id number]  DATE:01/29/2009                            DOB:          [birth year]    PRIMARY CARDIOLOGIST:  Bevelyn Buckles. Bensimhon, MD   I spoke with Mr. Dunnaway on the evening of January 28, 2009.  The patient  was concerned because his left upper extremity, which had been numb from  the elbow down over the last couple of weeks, had some worsening of  numbness, and now most of his left upper extremity from the shoulder  down was numb for most of the time.  I asked the patient about any  changes to his chronic chest pain, he reported there were no changes.  He continues to have intermittent very brief episodes of chest pain, no  change, and no associated symptoms.  The patient also reported that he  recently went to [facility] and was evaluated in the emergency  department and was not admitted and sent home after that evaluation.  Given that the patient's symptoms seem extremely atypical for a cardiac  source and that he had been recently worked up in the ED, I instructed  the patient to continue to be vigilant in terms of his symptomatology  and follow up with his primary care Nykerria Macconnell as soon as possible in  order to receive the proper referral and/or treatment for his worsening  left upper extremity numbness.  I also informed the patient should his  chest pain change or the severity of his symptoms increase  significantly, he should return again to the emergency department for a  second evaluation.  At the end of the conversation, the patient had no  questions or concerns that had not been addressed.  He was also  encouraged to call back with any questions should they arise.     Jarrett Ables, [facility]     MS/MedQ  DD: 01/29/2009  DT: 01/30/2009  Job #: [id number]

## 2011-04-06 NOTE — Assessment & Plan Note (Signed)
[facility] HEALTHCARE                            CARDIOLOGY OFFICE NOTE   Andrew West, Andrew West                     MRN:          [id number]  DATE:05/13/2008                            DOB:          May 13, 1966    PRIMARY CARE PHYSICIAN:  Sean A. Everardo All, MD   INTERVAL HISTORY:  Andrew West is 45 year old male with a history of coronary  artery disease, diabetes, hypertension, and hyperlipidemia.  Regarding  his coronary artery disease, he has a chronic total occlusion of his  dominant RCA with substantial collateral supply from the left.  He has  previously undergone stenting of the circumflex and ramus branch.  His  EF has been normal.  He has been plagued with chronic chest pain.  We  did a Myoview in September of 2008, which showed EF of 60%, no ischemia  or infarct.   He returns today for an unscheduled visit.  He said for the past 2-3  days, he has had worsening chest pain, this is off and on all day.  There are no clear triggers.  Not necessarily worse with exertion.  It  does not get worse with eating.  He does have shortness of breath, but  does not necessarily correlate with his chest pain.  He takes  nitroglycerin with minimal relief.   CURRENT MEDICATIONS:  1. Niaspan 1000 mg daily.  2. Lisinopril HCTZ 20/12.5 b.i.d.  3. Potassium 20 a day.  4. Pravastatin 80 a day.  5. Metoprolol 50 b.i.d.  6. Plavix 75 a day.  7. Prilosec 20 a day.  8. Metformin 1000 b.i.d.  9. Aspirin 325 a day.  10.Imdur 30 b.i.d.   PHYSICAL EXAMINATION:  GENERAL:  He is fatigued-appearing, but in no  acute distress.  Ambulatory in the clinic without any respiratory  difficulty.  VITAL SIGNS:  Blood pressure is 108/80, heart rate is 71, and weight is  258.  HEENT:  Normal.  NECK:  Supple.  No JVD.  Carotids are 2+, bilateral, without bruits.  There is no lymphadenopathy or thyromegaly.  CARDIAC:  PMI is nondisplaced.  Regular rate and rhythm.  No murmurs,  rubs, or  gallops.  LUNGS:  Clear.  ABDOMEN: Obese and nondistended.  No hepatosplenomegaly.  No bruits.  No  masses.  Good bowel sounds.  Mild epigastric discomfort.  No rebound or  guarding.  EXTREMITIES: Warm with no cyanosis, clubbing, or edema.  SKIN:  No rash.  NEURO:  Alert and oriented x3.  Cranial nerves II through XII are  intact.  Moves all fours extremities without difficulty.  Affect is  mildly flat.   EKG shows sinus rhythm with heart rate of 71.  No ST-T wave  abnormalities.   ASSESSMENT/PLAN:  1. Chest pain, this has both typical and atypical features.  He has      had a recent negative Myoview.  I told him at this point the      options are ramping up his medical therapy or considering      catheterization.  He is a bit hesitant about catheterization at  this point.  We will try increasing his Imdur to 60 b.i.d.  He will      get back to me if his pain is not improving.  We will also check      blood work.  He does have some epigastric tenderness.  We will      check an amylase and lipase.  We will check a troponin and D-dimer      as well.  2. Hypertension, well controlled.  3. Hyperlipidemia, followed by Dr. Everardo All.  Goal LDL is less than 70.      Continue statin and niacin.  4. Tobacco use.  He continues to smoke at least a pack a week.  I have      counseled him extensively on the need to stop this.   DISPOSITION:  Prior to him leaving, we discussed the possibly of  catheterization should his pain not dissipate in the near future, I  suspect we will be proceeding with catheterization.     Bevelyn Buckles. Bensimhon, MD  Electronically Signed    DRB/MedQ  DD: 05/13/2008  DT: 05/14/2008  Job #: [id number]

## 2011-04-06 NOTE — Assessment & Plan Note (Signed)
[facility] HEALTHCARE                                 ON-CALL NOTE   Andrew West, Andrew West                     MRN:          [id number]  DATE:11/24/2008                            DOB:          11-Mar-1966    The patient of Dr. Jens Som.  The patient called the on-call nurse  practitioner with 8/10 substernal left-sided chest pain associated with  nausea and mild shortness of breath for the last 2 days, waxing and  weaning, relieved somewhat with nitro.  The patient describes chest pain  as somewhat similar to previous episodes of cardiac chest pain.  The  patient was instructed to have his friend who is with him at the time  drive him to [facility], which is the nearest emergency  department for evaluation for acute coronary syndrome.     Jarrett Ables, [facility]     MS/MedQ  DD: 11/24/2008  DT: 11/25/2008  Job #: [id number]

## 2011-04-06 NOTE — Assessment & Plan Note (Signed)
[facility] HEALTHCARE                                 ON-CALL NOTE   MAXIMILLIAN, HABIBI                     MRN:          [id number]  DATE:02/02/2009                            DOB:          1965/12/10    CARDIOLOGIST:  Madolyn Frieze. Jens Som, MD, [facility].  The patient's phone number  is [phone].   HISTORY:  Mr. Mcneill is a 45 year old male patient with a history of  coronary disease status post prior stenting of the circumflex, ramus,  intermediate and OM1 in 2003.  He was recently admitted in January with  chest discomfort and ruled out for myocardial infarction by enzymes.  He  was to have a chest CT but this could not be completed.  There was  apparently some concern he was having a dye reaction.  He has since  followed up in the office with Dr. Jens Som.  He was complaining of some  left arm numbness and carotid Dopplers have been checked but the results  are unavailable to me at this time.  It was recommended he get back to  see his primary care physician.  He also spoke to one of our other  physician assistants on March 10 with complaints of left upper extremity  pain and some atypical chest symptoms.  He apparently went to the  emergency room on March 8 and was discharged to home.  Today he called  the answering service with multiple complaints.  He is complaining of  groin pain that seems to make his chest hurt worse when his heart beats.  He also continues to have left arm numbness.  He said it is from his  shoulder to his fingertips and it covers the complete arm.  He feels  weak in his arm and is unable to lift it above his head.  He has not yet  seen his primary care physician.  He notes some chest symptoms that he  thinks is similar to his previous anginal symptoms.  He denies any  exertional symptoms, radiating symptoms.  He says he has been somewhat  short of breath and nauseated.   PLAN:  The patient has a multitude of symptoms ongoing today.  I  have  asked him to go the closest emergency room for further evaluation by the  ER physician, especially for his chest pain.   DISPOSITION:  The patient plans to go the emergency room for further  evaluation.      Tereso Newcomer, PA-C  Electronically Signed      Madolyn Frieze. Jens Som, MD, [facility]  Electronically Signed   SW/MedQ  DD: 02/02/2009  DT: 02/02/2009  Job #: [id number]

## 2011-04-06 NOTE — Discharge Summary (Signed)
NAME:  Andrew West, PLY NO.:  [id number]   MEDICAL RECORD NO.:  [id number]          PATIENT TYPE:  OBV   LOCATION:  4735                         FACILITY:  [facility]   PHYSICIAN:  Madolyn Frieze. Jens Som, MD, FACCDATE OF BIRTH:  Aug 21, 1966   DATE OF ADMISSION:  11/25/2008  DATE OF DISCHARGE:  11/26/2008                               DISCHARGE SUMMARY   PRIMARY CARDIOLOGIST:  Madolyn Frieze. Jens Som, MD, [facility]   PRIMARY CARE [place] Mink:  Corwin Levins, MD   PRIMARY ENDOCRINOLOGIST:  Cleophas Dunker Everardo All, MD   DISCHARGE DIAGNOSIS:  Chest pain.   SECONDARY DIAGNOSES:  1. Chronic chest pain.  2. Coronary artery disease status post stenting of the left      circumflex, ramus intermedius, first obtuse marginal in 2003.      History of myocardial infarction in 2002.  3. Hypertension.  4. Hyperlipidemia.  5. Ongoing tobacco abuse.  6. Diabetes mellitus.  7. Obesity.  8. Gastroesophageal reflux disease.  9. Peripheral neuropathy.   ALLERGIES:  No known drug allergies.   PROCEDURES:  CT angio of the chest, which was negative for embolism in  the main pulmonary arteries.   HISTORY OF PRESENT ILLNESS:  A 45 year old African American male with a  long history of intermittent chest pain as well as a history of CAD  status post non-ST elevation MI in 2002 with PCI and stenting of the  circumflex, ramus intermedius, and OM-1 in 2003.  His last Myoview was  in September 2008 and was normal.  The patient presented to the [facility] in November 25, 2008, with a 4-day history of constant epigastric  pain radiating through to his back that was worse with palpation and  deep breathing.  It was not similar to his previous angina.  In the ED,  he was noted to be in sinus rhythm without any acute changes on his ECG.  Chest x-ray showed no acute changes, and cardiac markers were negative.  The patient was admitted for rule out.   HOSPITAL COURSE:  The patient ruled out for MI.  He continued to have  epigastric pain that was worse with palpation and deep breathing, and we  planned to perform a CT angio of the chest, abdomen, and pelvis to rule  out dissection.  Following dye administration, the patient felt  nauseated and thought he was having worsening chest pain, and thus, they  were only able to capture images of the chest showing no obvious emboli  in the main pulmonary arteries.  The radiology was unable to rule out  dissection, and the abdomen and pelvic aorta were not evaluated.  The  patient has since been feeling better.  He wishes to go home, and as we  have very low suspicion for aneurysm or dissection, we are planning to  discharge this evening in good condition.  Of note, the patient told the  radiologist that his heart had stopped with the previous dye  administration during catheterization.  Upon review of his previous cath  report, that was not the case.  He did require cardioversion during a  catheterization in 2002, but this was unrelated to the dye.   DISCHARGE LABORATORY DATA:  Hemoglobin 15.0, hematocrit 44.0, WBC 7.9,  and platelets 257.  Sodium 142, potassium 3.7, chloride 104, CO2 of 26,  BUN 8, creatinine 0.9, and glucose 98.  BNP 64.  Lipase 24.  CK 237, MB  1.5, and troponin I 0.01.  Total cholesterol 122, triglycerides 235, HDL  18, LDL 55.  TSH 1.126.   DISPOSITION:  The patient will be discharged home in a good condition.   FOLLOWUP PLANS AND APPOINTMENTS:  He has to follow up with Dr. Jens Som  in approximately 2 weeks and Dr. Jonny Ruiz as scheduled.   DISCHARGE MEDICATIONS:  1. Metformin 1000 mg b.i.d. to be resumed on November 28, 2008.  2. Plavix 75 mg daily.  3. Metoprolol tartrate 50 mg b.i.d.  4. Pravastatin 80 mg at bedtime.  5. Imdur 60 mg b.i.d.  6. Klor-Con 10 mEq daily.  7. Omeprazole 20 mg daily.  8. Lisinopril/hydrochlorothiazide 40/25 mg daily.  Notable, the      patient had been taking low-dose of lisinopril/hydrochlorothiazide      in  addition to quinapril/hydrochlorothiazide 20/12.5.  We have      discontinued quinapril/hydrochlorothiazide and consolidated to      lisinopril/hydrochlorothiazide 40/25.   OUTSTANDING LABORATORY STUDIES:  None.   DURATION OF DISCHARGE ENCOUNTER:  40 minutes including physician time.      Nicolasa Ducking, ANP      Madolyn Frieze. Jens Som, MD, [facility]  Electronically Signed    CB/MEDQ  D:  11/26/2008  T:  11/27/2008  Job:  [id number]   cc:   Corwin Levins, MD  Cleophas Dunker Everardo All, MD
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
## 2011-04-06 NOTE — Discharge Summary (Signed)
NAME:  Andrew West, Andrew West NO.:  [id number]   MEDICAL RECORD NO.:  [id number]          PATIENT TYPE:  INP   LOCATION:  3735                         FACILITY:  [facility]   PHYSICIAN:  Rollene Rotunda, MD, FACCDATE OF BIRTH:  12/31/1965   DATE OF ADMISSION:  02/02/2009  DATE OF DISCHARGE:  02/03/2009                               DISCHARGE SUMMARY   PRIMARY CARDIOLOGIST:  Madolyn Frieze. Jens Som, MD, [facility]   DISCHARGE DIAGNOSIS:  Chest pain, unclear etiology.  The patient with  known coronary artery disease and chronic chest discomfort, very  difficult to evaluate.  Cardiac catheterization recommended.  On  admission, the patient, however, has refused.  The patient is being  discharged home to follow up with Dr. Jens Som in outpatient.  He has  been given a prescription for nitroglycerin to use p.r.n. at time of  discharge.   BRIEF SUMMARY:  This is a 45 year old African American male with known  history of hypertension, coronary artery disease status post PCI to the  left circumflex, ramus, and OM-1 in 2003 who has chronic chest  discomfort.  He uses Percocet p.r.n. at home.  Also have a history of  diabetes, dyslipidemia, tobacco abuse, the patient states he quit 2  weeks, GERD, and peripheral neuropathy presented with complaints of  chest discomfort as well as right arm tingling, weakness for 1-1/2  weeks.  On day of admission, he had onset of chest pain described  associated with hot feeling, lasted 30 minutes, took 1 nitroglycerin,  was relieved, decided to come in and get evaluated.  Initial point-of-  cares were negative x2 sets.  Chest x-ray showed no acute findings.  The  patient is scheduled for cardiac catheterization but declined.  Dr.  Antoine Poche in to see the patient and Dr. Antoine Poche discussed with Dr.  Jens Som.  The patient's situation is resided since the patient has  ruled out by cardiac serial markers and 12-lead EKG to discharge home  and have the patient follow  up with Dr. Jens Som in the office for  further evaluation.  The patient has been instructed to continue his  home medications at time of discharge.  He has also been given a  prescription for his nitroglycerin.   MEDICATIONS PRIOR TO THIS ADMISSION:  1. Niaspan 1 gram.  2. Lisinopril and hydrochlorothiazide 20/12.5 mg b.i.d.  3. KCl 20 mEq daily.  4. Pravachol 80.  5. Lopressor 50 b.i.d.  6. Plavix 75.  7. Omeprazole 20.  8. Metformin 1000 b.i.d.  9. Aspirin 325.  10.Imdur 60 b.i.d.   The patient also states he was taking Percocet p.r.n. as needed per  prescribing physician.   FOLLOWUP:  The patient has been scheduled to follow up with Dr. Jens Som  on February 27, 2009 at 8:30.   DURATION OF DISCHARGE ENCOUNTER:  Greater than 30 minutes between the  time spent with the patient and between Dr. Antoine Poche and myself.      Dorian Pod, ACNP      Rollene Rotunda, MD, [facility]  Electronically Signed    MB/MEDQ  D:  02/03/2009  T:  02/04/2009  Job:  [id number]

## 2011-04-06 NOTE — Assessment & Plan Note (Signed)
[facility] HEALTHCARE                            CARDIOLOGY OFFICE NOTE   DELEON, SEMRAD                     MRN:          [id number]  DATE:02/22/2008                            DOB:          [birth year]    PRIMARY CARE PHYSICIAN:  Dr. Romero Belling.   HISTORY:  [name] is a very pleasant 45 year old male with coronary  artery disease, diabetes, hypertension, hypercholesterolemia.  He also  has CAD with chronic total occlusion of dominant RCA with substantial  collateral supply from the left.  His EF is normal.  He has previously  undergone stenting of the circumflex and ramus branch.  He has been  plagued by chronic chest pain.   We saw him last in September when he was having some ongoing chest pain.  We ordered a Myoview that showed normal LV function.  An EF of 60% with  no ischemia or infarct.   He returns today and says he is doing quite well.  He still continues to  have intermittent chest pain but says it feels like it is better.  He  does take nitroglycerin occasionally and gets relief.  He has not had  any nocturnal pain.   CURRENT MEDICATIONS:  1. Plavix 5 a day.  2. Lisinopril/hydrochlorothiazide 20/12.5 b.i.d.  3. Niaspan 1000 a day.  4. Potassium 20 a day.  5. Pravastatin 80 a day.  6. Metoprolol 50 b.i.d.  7. Prilosec 20 a day.  8. Metformin 1000 b.i.d.  9. Aspirin 325 a day.  10.Imdur 30 b.i.d.   PHYSICAL EXAM:  He is well-appearing no acute distress.  Ambulates  around the clinic without respiratory difficulty.  Blood pressure is 110/80, heart rate 74 weight is 272.  HEENT is normal.  Neck is supple.  No JVD.  Carotids 2+ bilateral without bruits.  There  is no lymphadenopathy or thyromegaly.  CARDIAC:  PMI is nondisplaced.  Regular rate and rhythm.  No murmurs,  rubs or gallops.  LUNGS:  Clear.  ABDOMEN:  Obese, nontender, nondistended, no hepatosplenomegaly, no  bruits, no masses.  Good bowel sounds.  EXTREMITIES:   Warm.  No cyanosis, clubbing or edema.  No rash.  NEURO:  Alert and oriented x3.  Cranial nerves II-XII intact.  Moves all  four stress difficulty.  Affect is pleasant.   EKG shows normal sinus rhythm rate of 74.  He does have some mild  nonspecific inferior ST-T wave abnormalities.   ASSESSMENT/PLAN:  1. Coronary artery disease is stable.  No evidence of ischemia.      Continue current therapy.  Myoview is reassuring.  He does have      chronic chest pain. I had a long talk with him about that.  I think      this is probably noncardiac and we need to be careful to really      watch for any changes in the quality of his pain to indicate that      this might be truly angina.  He is on great medical regimen.  2. Hypertension.  Well-controlled.  3. Hyperlipidemia followed by Dr.  Everardo All goal LDL is less than 70.   DISPOSITION:  Will see him back in 6 months for routine followup.     Bevelyn Buckles. Bensimhon, MD  Electronically Signed    DRB/MedQ  DD: 02/22/2008  DT: 02/22/2008  Job #: [id number]

## 2011-04-06 NOTE — Assessment & Plan Note (Signed)
[facility] HEALTHCARE                            CARDIOLOGY OFFICE NOTE   Andrew West, Andrew West Andrew West                     MRN:          [id number]  DATE:01/29/2009                            DOB:          1966-06-17    Andrew West is a 45 year old gentleman who has a history of coronary  artery disease, chronic chest pain, hypertension, diabetes, and  hyperlipidemia.  He was last admitted to [facility] in January  of 2010.  He was complaining of epigastric pain that was reproduced with  palpation.  We did plan to proceed with CT angiogram of the chest,  abdomen, and pelvis to rule out dissection.  However, following diet  administration, the patient felt nauseated and thought he was having  worsening chest pain.  He will only able to capture images of the chest  that showed no obvious pulmonary emboli.  The patient subsequently felt  better and has to be discharged and the suspicion for aneurysm and  dissection was low.  It was also noted at that time that the patient was  felt probably not likely to have a dye allergy.  Since discharge, he  denies any dyspnea.  The patient has a twinge of chest pain that lasts  for seconds, but is not sustained.  He does not have exertional chest  pain.  He does state the past week that his left arm has been somewhat  numb.  There is no other neurological symptoms noted.  He has not had  headaches.  Because the above, he asked to be seen today.  There is no  associated chest pain or shortness of breath.  There is no loss of  strength in his left upper extremity.   MEDICATIONS:  1. Niaspan 1 g p.o. daily.  2. Lisinopril HCT 20/12.5 mg p.o. b.i.d.  3. Potassium 2 p.o. daily.  4. Pravachol 80 mg p.o. daily.  5. Lopressor 50 mg p.o. b.i.d.  6. Plavix 75 mg p.o. daily.  7. Omeprazole 20 mg p.o. daily.  8. Metformin 1000 mg p.o. b.i.d.  9. Aspirin 325 mg p.o. daily.  10.Imdur 60 mg p.o. b.i.d.   PHYSICAL EXAMINATION:   VITAL SIGNS:  Blood pressure of 124/78.  His  pulse is 88.  HEENT:  Normal.  NECK:  Supple.  CHEST:  Clear.  CARDIOVASCULAR:  Regular rhythm.  ABDOMEN:  No tenderness.  EXTREMITIES:  No edema.  He does have a 2+ radial pulse on the left.  There is no loss of strength.  NEUROLOGIC:  I can find no focal neurological findings.   His electrocardiogram shows a sinus rhythm at a rate of 88.  There are  nonspecific ST changes.   DIAGNOSES:  1. Left arm numbness - etiology of this is unclear to me.  However,      this may continuous for 1 week.  I do not find any gross focal      neurological findings.  We will schedule him to have carotid      Dopplers.  If it persists, I have asked him to follow up with Dr.  John concerning this issue.  2. Coronary artery disease - the patient will continue on his aspirin,      statin, angiotensin-converting enzyme inhibitor, and beta-blocker.      He has not had any exertional chest pain at present.  3. History of chronic chest pain.  4. Hypertension - his blood pressure is adequately controlled.  5. Hyperlipidemia - he will continue statin and Niaspan and check      lipids and liver and adjust as indicated.  6. Tobacco abuse - we discussed the importance of discontinuing this.   We will see him back in approximately 6 months.     Madolyn Frieze Jens Som, MD, [facility]  Electronically Signed    BSC/MedQ  DD: 01/29/2009  DT: 01/30/2009  Job #: [id number]

## 2011-04-06 NOTE — Assessment & Plan Note (Signed)
[facility] HEALTHCARE                            CARDIOLOGY OFFICE NOTE   AKSHATH, SAPIO                     MRN:          [id number]  DATE:05/08/2007                            DOB:          [birth year]    PRIMARY CARE PHYSICIAN:  Romero Belling, M.D.   HISTORY OF PRESENT ILLNESS:  Mr. Migliozzi is a 45 year old gentleman with  coronary disease in the setting of diabetes, hypertension and  hypercholesterolemia.  He has a long segment chronic total occlusion in  the dominant RCA with substantial collateral supply from the left.  His  EF is normal.  He has previously undergone stenting of the circumflex  and ramus intermedius.   I saw Mr. Robuck in April and he was complaining of daily chest  discomfort.  He has had substantial difficulties with noncardiac chest  discomfort in the past.  To attempt to sort this out, we scheduled a  stress test.  Patient did not show up for this.  He has continued to be  troubled by chest pain.  In fact, in the office today he says he has had  continuous 3/10 right-sided chest pain for more than 12 hours.  There is  no associated diaphoresis, palpitations or dyspnea.  He is not having  any claudication.  There is no worsening of his chest discomfort with  exertion.  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   CURRENT MEDICATIONS:  1. Aspirin 325 mg daily.  2. He is off his Plavix, as he ran out a few days ago.  3. K-Dur 20 mEq daily.  4. Glucophage 1000 mg twice daily.  5. Toprol-XL 100 mg daily.  6. Niaspan 100 mg daily.  7. Omeprazole 20 mg daily.  8. Actos 45 mg daily.  9. Pravastatin 40 mg daily.  10.Quinapril/HCTZ 20/12.5 one tab b.i.d.  11.Isordil 30 mg twice daily.   PHYSICAL EXAM:  He is generally well-appearing, in no distress, with  heart rate 76, blood pressure 122/76 and weight of 276 pounds.  He has  no jugular venous distention, thyromegaly or lymphadenopathy.  LUNGS:  Clear to auscultation.   He has a nondisplaced point of maximal  cardiac impulse.  There is a regular rate and rhythm without murmur,  rub, or gallop.  ABDOMEN:  Soft, nondistended, nontender.  There is no  hepatosplenomegaly.  Bowel sounds are normal.  EXTREMITIES:  Warm and without edema.   Electrocardiogram demonstrates normal sinus rhythm and is a normal EKG.   IMPRESSION/RECOMMENDATION:  1. Coronary disease:  Worsening chest pain.  It is not at all clear to      me that this is angina.  I am comforted by his normal EKG today.      Given the 12 hours of pain that he had, will check a troponin.  If      normal, I think that will provide excellent comfort that his      symptoms are not cardiac in etiology.  If abnormal, I let him know      that we will hospitalize him today.  2.  Diabetes mellitus per Dr. Everardo All.  3. Hypertension, nicely controlled.  4. Hypercholesterolemia:  As per Dr. Everardo All.   Patient will follow up with Dr. Gala Romney in 3 months' time.     Salvadore Farber, MD  Electronically Signed    WED/MedQ  DD: 05/08/2007  DT: 05/08/2007  Job #: [id number]   cc:   Gregary Signs A. Everardo All, MD
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
## 2011-04-06 NOTE — Assessment & Plan Note (Signed)
[facility] HEALTHCARE                            CARDIOLOGY OFFICE NOTE   [name], [name]                     MRN:          [id number]  DATE:08/08/2007                            DOB:          04-May-1966    PRIMARY CARE PHYSICIAN:  Dr. Romero Belling.   INTERVAL HISTORY:  [name] is a 45 year old male with a history of  coronary artery disease in the setting of diabetes, hypertension,  hypercholesterolemia. He has a long segment total occlusion in the  dominant RCA with substantial collateral supply from the left. He has  also previously undergone stenting of the circumflex and ramus vessels.  His ejection fraction is normal. His medical history is also complicated  by a chronic, apparently non-cardiac chest pain. He was previously  followed by Dr. Samule Ohm, comes today to establish long term follow up.   [name] says he is doing so-so. He continues to have chest pain  several times a week. This is not related to exertion, can happen at  anytime, feels like a pressure. It is no worse than previous. He does  not have any orthopnea, PND. No lower extremity edema. No change in his  exercise tolerance.   CURRENT MEDICATIONS:  1. Plavix 75 a day.  2. Aspirin 325 a day.  3. Potassium 20 a day.  4. Glucophage 1000 b.i.d.  5. Toprol XL 100 a day.  6. Niaspan 1000 a day.  7. Omeprazole 20 a day.  8. Actos, he ran out.  9. Pravastatin 80 a day, he is out.  10.Quinapril/hydrochlorothiazide 20/12.5 b.i.d.  11.Isosorbide mononitrate 30 mg twice a day.   PHYSICAL EXAMINATION:  He is a well-appearing, no acute distress.  Ambulates around the clinic without any respiratory difficultly. Blood  pressure is 125/80, heart rate is 73, weight is 278 which is stable. He  is markedly tender over his sternum.  HEENT: Normal.  NECK: Supple. No obvious JVD. Carotids are 2 + bilaterally without any  bruits. There is no lymphadenopathy or thyromegaly.  CARDIAC: PMI  is non displaced. He has a regular rate and rhythm. No  murmurs, rubs, or gallops.  LUNGS: Clear.  ABDOMEN: Obese, nontender, nondistended. No hepatosplenomegaly. No  bruits. No masses appreciated.  EXTREMITIES: Warm with no cyanosis, clubbing, or edema.  No rash.  NEURO: Alert and oriented x3. Cranial nerves II-XII grossly intact.  Moves all 4 extremities without difficultly. Affect is pleasant.   EKG: Shows normal sinus rhythm with minimal T wave flattening  inferiorly, is unchanged from previous.   ASSESSMENT/PLAN:  1. Coronary artery disease. He continues to have chronic, what appears      to be non cardiac chest pain but it is difficult to sort this out      in the face of his significant underlying coronary disease. We will      proceed with a repeat treadmill Myoview to reevaluate. I would only      catheter if there were a significant amount of ischemia on the      scan.  2. Hypertension, well controlled.  3. Hyperlipidemia, this is followed  by Dr. Everardo All. Goal LDL is less      than 70.   DISPOSITION:  Pending the results of his stress test.     Bevelyn Buckles. Bensimhon, MD  Electronically Signed    DRB/MedQ  DD: 08/08/2007  DT: 08/08/2007  Job #: [id number]

## 2011-04-06 NOTE — Assessment & Plan Note (Signed)
[facility] HEALTHCARE                            CARDIOLOGY OFFICE NOTE   BEARETT, WACH                     MRN:          [id number]  DATE:07/04/2008                            DOB:          [birth year]    PRIMARY CARE PHYSICIAN:  Sean A. Everardo All, MD.   INTERVAL HISTORY:  Andrew West is a very pleasant 45 year old male with  coronary artery disease, diabetes, hypertension, hyperlipidemia, and  chronic chest pain.  He is previously followed by Dr. Samule Ohm.  He has  coronary artery disease with a chronic total occlusion of the dominant  RCA with substantial collateral supply from the left.  He has had  previously undergone stenting of the circumflex in ramus branch.  His EF  has been normal.  Unfortunately, he has been suffering from chronic  chest pain.  We did a Myoview in September 2008, which showed an EF of  60% with no ischemia or infarct.   We saw him about a month or 2 ago and he is having more chest pain.  We  scheduled him for cardiac catheterization, but he did not show up for  the catheterization saying he was scared.  We did titrate his medical  regimen.   He returns today for followup.  He says his chest pain is much improved.  He still has an occasional twinge and is worried that his stent may be  closing up, but he has not noticed any shortness of breath or  progressive exercise intolerance.  Unfortunately, he continues to smoke.   CURRENT MEDICATIONS:  1. Niaspan 1000 a day.  2. Lisinopril/HCTZ 20/12.5.  3. Potassium 20 a day.  4. Pravastatin 80 a day.  5. Metoprolol 50 b.i.d.  6. Plavix 75 a day.  7. Prilosec 20 a day.  8. Metformin 1000 b.i.d.  9. Aspirin 325 a day.  10.Imdur 60 b.i.d.   PHYSICAL EXAMINATION:  GENERAL:  He is in no acute distress, ambulatory  in the clinic without any respiratory difficulty.  VITAL SIGNS:  Blood pressure is 106/74, heart rate 70, he weighs 253.  HEENT:  Normal.  NECK:  Supple.  No JVD.  Carotids  are 2+ bilaterally without any bruits.  There is no lymphadenopathy or thyromegaly.  CARDIAC:  PMI is nondisplaced.  He has a regular rate and rhythm with no  murmurs, rubs or gallops.  He is mildly tender over his chest wall under  his left breast.  LUNGS:  Clear.  ABDOMEN:  Obese, nontender, nondistended.  No hepatosplenomegaly, no  bruits, no masses.  Good bowel sounds.  EXTREMITIES:  Warm with no  cyanosis, clubbing, or edema.  No rash.  NEURO:  Alert and oriented x3.  Cranial nerves II-XII are intact.  Moves  all fours without difficulty.  Affect is normal.   ASSESSMENT AND PLAN:  1. Coronary artery disease with chronic chest pain.  His chest pain is      improved.  We have decided to hold off on catheterization for now.      He will let me know if anything changes.  2. Hypertension well-controlled.  3. Hyperlipidemia, followed by Dr. Everardo All, goal LDL is less than 70.  4. Tobacco use, once again reinforced with him the need to stop      smoking.   DISPOSITION:  We will see him back in several months for routine  followup.     Bevelyn Buckles. Bensimhon, MD  Electronically Signed    DRB/MedQ  DD: 07/04/2008  DT: 07/05/2008  Job #: [id number]

## 2011-04-06 NOTE — Assessment & Plan Note (Signed)
[facility] HEALTHCARE                                 ON-CALL NOTE   LAWARENCE, MEEK                       MRN:          [id number]  DATE:05/14/2007                            DOB:          10/24/1966    Primary cardiologist, Dr. Randa Evens.   Mr. Sparacino stated that today he had some pain in the back of his neck  that was coming around to his chest.  He stated that he was not quite  sure what to do about this.  He stated that it was a little bit  different from his usual chest pain for which he was evaluated by Dr.  Samule Ohm on May 08, 2007, and for which Dr. Samule Ohm was able to reassure  him that it was unlikely to be angina.   I advised Mr. Knaus that the only way to be evaluated was for him to  come to the emergency room.  I advised him that if he thought he was  having a heart attack, he should call 9-1-1 and come by EMS.  He stated  that he would do so.      Theodore Demark, PA-C  Electronically Signed      Bevelyn Buckles. Bensimhon, MD  Electronically Signed   RB/MedQ  DD: 05/14/2007  DT: 05/15/2007  Job #: [id number]
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
## 2011-04-09 NOTE — Cardiovascular Report (Signed)
[place]. [facility]  Patient:    Andrew West, Andrew West Visit Number: [id number] MRN: [id number]          Service Type: MED Location: 6500 [id number] Attending Physician:  Talitha Givens Dictated by:   Rollene Rotunda, M.D. [facility] Proc. Date: 02/12/02 Admit Date:  02/10/2002   CC:         Veneda Melter, M.D.   Cardiac Catheterization  DATE OF BIRTH: [birth year]  CARDIOLOGIST: Veneda Melter, M.D.  PROCEDURE: Left heart cardiac catheterization/coronary arteriography.  INDICATIONS: Evaluate patient with unstable angina and previous stenting of the circumflex.  DESCRIPTION OF PROCEDURE: Left heart catheterization was performed via the right femoral artery. The artery was cannulated using an anterior wall puncture. A #6 French arterial sheath was inserted via the modified Seldinger technique.  Preformed Judkins and a pigtail catheter were utilized.  The patient tolerated the procedure well and left the lab in stable condition.  RESULTS:  HEMODYNAMICS: LV 132/9, AO 129/68.  CORONARY ARTERIOGRAPHY: Left main was normal.  The LAD was a large vessel with luminal irregularities.  The circumflex had a stent in the mid AV groove. There were luminal irregularities within the stent. There was a very large ramus intermediate which had a mid 70% stenosis. A superior branch of this that was distal had a 90% stenosis but there was still a large segment of vessel beyond this stenosis. There was a posterolateral, which had proximal 50% stenosis and the distal AV groove had 50-60% stenosis into a posterolateral vessel. This appeared to be a codominant circumflex system.  The right coronary artery was occluded with circumflex and LAD collateral flow scantly filling the distal vessel.  LEFT VENTRICULOGRAM: The left ventriculogram was obtained in the RAO projection. The EF was 60% with normal wall motion.  CONCLUSION: High-grade single-vessel coronary artery  disease. Patent stent in the circumflex arteriovenous groove. The patient has had progression of the disease in the ramus intermediate. He will have percutaneous revascularization of this per Dr. Juanda Chance. Dictated by:   Rollene Rotunda, M.D. [facility] Attending Physician:  Talitha Givens DD:  02/12/02 TD:  02/13/02 Job: [id number]

## 2011-04-09 NOTE — Cardiovascular Report (Signed)
[place]. [facility]  Patient:    Andrew West, Andrew West Visit Number: [id number] MRN: [id number]          Service Type: MED Location: 6500 6522 [id number] Attending Physician:  Colon Branch Dictated by:   Daisey Must, M.D. [facility] Proc. Date: 12/13/01 Admit Date:  12/11/2001   CC:         Veneda Melter, M.D. [facility]  Cardiac Catheterization Laboratory   Cardiac Catheterization  PROCEDURES PERFORMED: 1. Left heart catheterization with coronary angiography and left    ventriculography. 2. Percutaneous transluminal coronary angiography with stent placement in the    mid left circumflex.  INDICATIONS:  Mr. Caesar is a 45 year old male with a history of a non-Q wave myocardial infarction in November 2002, treated with stent to the mid left circumflex.  He presented to the hospital with recurrent unstable angina and was referred for cardiac catheterization.  CATHETERIZATION PROCEDURAL NOTE:  A 6-French sheath was placed in the right femoral artery.  Coronary angiography was performed with standard Judkins 6-French catheters.  Left ventriculography was performed with an angled pigtail catheter.  Contrast was Omnipaque.  There were no complications.  RESULTS:  HEMODYNAMICS:  Left ventricular pressure 124/18, aortic pressure 124/80. There was no aortic valve gradient.  LEFT VENTRICULOGRAM:  Wall motion is normal.  Ejection fraction is calculated at 63%.  There is no mitral regurgitation.  CORONARY ARTERIOGRAPHY (LEFT DOMINANT):  Left main is normal.  Left anterior descending artery has a 30% stenosis in the proximal vessel, 20% in the mid vessel, and 20% in the distal vessel.  The LAD gives rise to two small diagonal branches.  Left circumflex is a large dominant vessel.  In the mid vessel is a tubular 95% stenosis.  This is just proximal to the previously placed stent.  The stent which is further down in the mid vessel is widely patent.  In the  distal circumflex is a diffuse 30% stenosis in the area of the posterolateral branches.  The circumflex gives rise to a very large branching first obtuse marginal.  In the proximal portion of this obtuse marginal is a diffuse 70% stenosis.  In the more superior branch of this obtuse marginal, somewhat distally, is a tubular 80% stenosis.  There is a small second obtuse marginal. The distal circumflex gives rise to a normal sized first posterolateral branch which has a 30% stenosis proximally.  There is also a small second posterolateral branch which is small to normal sized, a small to normal sized third posterolateral branch, small fourth posterolateral branch, and a normal sized posterior descending artery.  There is mild diffuse disease in these vessels.  The right coronary artery is a nondominant vessel.  It is 100% occluded proximally.  There are left-to-right collaterals filling the distal right coronary artery.  IMPRESSIONS: 1. Normal left ventricular systolic function. 2. Two-vessel coronary artery disease characterized by significant lesion in    the mid portion of a dominant left circumflex.  There is also moderate    disease in the large obtuse marginal and chronic occlusion of a nondominant    right coronary artery.  PLAN:  Percutaneous intervention of the mid left circumflex, see below.  PERCUTANEOUS TRANSLUMINAL CORONARY ANGIOGRAPHY PROCEDURAL NOTE:  Following completion of the diagnostic catheterization, we proceeded with percutaneous coronary intervention.  The preexisting 6-French sheath in the right femoral artery was exchanged over a wire for a 7-French sheath.  Heparin and Integrilin were administered per protocol.  We used a 7-French  Voda left 3.5 guiding catheter and a BMW wire. The lesion in the mid circumflex was predilated with a 2.5 x 15 mm Quantum balloon and inflated to 10 atm for two inflations.  We then deployed a 2.5 x 18 mm BX Velocity stent at a  deployment pressure of 18 atm for a final diameter of approximately 2.8 mm.  Final angiographic images revealed patency of the mid left circumflex with 0% residual stenosis and TIMI-3 flow.  COMPLICATIONS:  None.  RESULTS:  Successful percutaneous transluminal coronary angiography with stent placement in the mid left circumflex reducing a 95% stenosis to 0% residual with TIMI-3 flow.  PLAN:  Integrilin will be continued for an additional 18 hours.  It is recommended that Plavix be administered for approximately six months. Dictated by:   Daisey Must, M.D. [facility] Attending Physician:  Colon Branch DD:  12/13/01 TD:  12/13/01 Job: [id number]

## 2011-04-09 NOTE — Procedures (Signed)
[place]. [facility]  Patient:    Andrew West, Andrew West Visit Number: [id number] MRN: [id number]          Service Type: MED Location: [id number] Attending Physician:  Talitha Givens Dictated by:   Everardo Beals Juanda Chance, M.D. [facility] Proc. Date: 02/12/02 Admit Date:  02/10/2002 Discharge Date: 02/13/2002   CC:         Veneda Melter, M.D. [facility]  Cardiac Catheterization Lab   Procedure Report  CLINICAL HISTORY:  [name] is 45 years old and has documented coronary disease.  He had stents placed in the mid circumflex in November 2002 and in January 2003 which were overlapping.  He was admitted with recurrent unstable angina and studied earlier today by Dr. Antoine Poche who found tight lesions in the circumflex intermedius vessel as well as the marginal vessel.  DESCRIPTION OF PROCEDURE:  The procedure was performed via the right femoral artery using an arterial sheath.  We used a #7 [place] 4.0 Voda guiding catheter.  The patient was given weight-adjusted heparin to prolong an ACT of greater than 200 seconds and was given double bolus Integrilin and infusion. We were able to navigate a short Floppy wire down the circumflex intermedius vessel without too much difficulty.  We initially predilated the mid lesion with a 2.25 x 20-mm Maverick performing one inflation of 10 atmospheres for 46 seconds.  We then advanced the balloon down to the distal lesion and performed two inflations of 8 and 10 atmospheres for 48 and 63 seconds.  This resulted in a split and a suboptimal result so we elected to stent this vessel which was not our original plan.  We used a 2.0 x 18-mm Pixel and deployed this with two inflations of 12 and 15 atmospheres for 34 and 29 seconds with the second inflation having the balloon pulled inside the distal edge.  We then stented the mid lesion in the circumflex intermedius vessel with a 2.5 x 24-mm AVE, deploying this with one inflation to 12  atmospheres for 38 seconds.  We then turned our attention to the marginal vessel.  We renavigated the short Floppy wire down the marginal vessel.  We direct-stented with a 2.25 x 8-mm Pixel stent, deploying this with one inflation of 13 atmospheres for 63 seconds. Repeat diagnostic studies were then performed through the guiding catheter. The patient tolerated the procedure well and left the laboratory in satisfactory condition.  RESULTS: 1. Initially the stenosis in the distal intermedius vessel was estimated at    95%.  Following stenting, this improved to less than 10%. 2. Initially the stenosis in the mid circumflex intermedius vessel was    estimated at 80% and following stenting, this improved to less than 10%. 3. The stenosis in the first marginal branch of the circumflex artery was    initially 90%.  This improved to less than 10% with stenting.  CONCLUSION: 1. Successful stenting of tandem lesions in the circumflex intermedius vessel    with improvement in a distal lesion from 95% to less than 10% and    improvement in the mid lesion from 80% to less than 10%. 2. Successful stenting of the first circumflex marginal vessel with    improvement in the percent area narrowing from 90% to less than 10%.  DISPOSITION:  The patient was returned to the post angioplasty unit for further observation. Dictated by:   Everardo Beals Juanda Chance, M.D. [facility] Attending Physician:  Talitha Givens DD:  02/12/02 TD:  02/13/02 Job: [id number]
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
## 2011-04-09 NOTE — Cardiovascular Report (Signed)
[place]. [facility]  Patient:    Andrew West, Andrew West Visit Number: [id number] MRN: [id number]          Service Type: MED Location: 2000 2012 01 Attending Physician:  Junious Silk Dictated by:   Noralyn Pick Eden Emms, M.D. [facility] Proc. Date: 03/05/02 Admit Date:  03/04/2002   CC:         Veneda Melter, M.D.   Cardiac Catheterization  PROCEDURE PERFORMED: Coronary arteriography.  INDICATIONS: Status post previous PTCA and stenting of the circumflex and intermediate branches with recurrent chest pain. Cine catheterization was done from the right femoral artery.  The patient had dense connective tissue and scarring in the area. However, we were able to enter the right femoral artery without too much difficulty.  Left main coronary artery had a 20-30% distal stenosis.  Left anterior descending artery had 30% multiple discrete lesion in the proximal and midportion and the distal vessel had 30-40% multiple discrete lesions.  There was a large intermediate branch which had previous stenting. The stent was widely patent.  There was 20-30% multiple discrete lesions distally.  The circumflex coronary artery was at least codominant. The mid vessel stents were widely patent with 20% residual stenosis.  The first obtuse marginal branch, after the intermediate, was subtotally occluded from the previous stent placements. The second obtuse marginal branch had a 50% ostial lesion. The third obtuse marginal branch was small and diffusely diseased with a 70-80% tubular narrowing at the ostium. This was less than a 1 mm vessel and the posterior descending branch had 30-40% diffuse disease.  There were good left to right collaterals to the right coronary artery. The right coronary artery was 100% occluded proximal with some right to right collaterals.  RIGHT ANTERIOR OBLIQUE VENTRICULOGRAPHY: RAO ventriculography was normal with hypodynamic LV function. Ejection  fraction was 80%. There was no gradient across the aortic valve and no MR. Aortic pressure was in the 130/60 range. LV pressure was in the 130/15 range.  IMPRESSION: The patients chest pain would appear to be noncardiac in etiology. He may occasionally get angina from his small posterolateral branches or insufficient collaterals to his right.  However, the films were reviewed with Dr. Riley Kill and he felt that these distal OM or posterolateral branches were too small to intervene on. His major epicardial vessels were widely patent including the two previous stents to the circumflex as well as a stent in the intermediate branch. Continued medical therapy is warranted.  The patient will be discharged in the morning so long as his leg heals well. Dictated by:   Noralyn Pick Eden Emms, M.D. [facility] Attending Physician:  Junious Silk DD:  03/05/02 TD:  03/06/02 Job: [id number]

## 2011-04-09 NOTE — Cardiovascular Report (Signed)
NAME:  Andrew West, Andrew West                        ACCOUNT NO.:  [id number]   MEDICAL RECORD NO.:  [id number]                   PATIENT TYPE:  OIB   LOCATION:  6527                                 FACILITY:  [facility]   PHYSICIAN:  Veneda Melter, M.D.                   DATE OF BIRTH:  January 05, 1966   DATE OF PROCEDURE:  10/10/2003  DATE OF DISCHARGE:                              CARDIAC CATHETERIZATION   PROCEDURES PERFORMED:  1. Left heart catheterization.  2. Left ventriculogram.  3. Selective coronary angiography.  4. PTCA of the mid AV circumflex.   DIAGNOSES:  1. Two vessel coronary artery disease.  2. Normal left ventricular systolic function.  3. Stable angina.   HISTORY:  Andrew West is a 45 year old black gentleman with a known history  of coronary artery disease who has previously undergone percutaneous  intervention with stent placement to the left circumflex artery who presents  with recurrence of substernal chest discomfort.  The patient underwent  stress imaging study showing ischemia in the inferolateral wall.  He  presents for further assessment.   TECHNIQUE:  Informed consent was obtained.  The patient was brought to the  catheterization laboratory.  A 6-French sheath placed in the right femoral  artery using modified Seldinger technique.  A 6-French JL4 and JR4 catheter  was then used to engage the left and right coronary arteries.  Selective  angiography performed in various projections using manual injections of  contrast.  A 6-French pigtail catheter was advanced left ventricle and left  ventriculogram then performed using power injections of contrast.   FINDINGS:  1. Left main trunk:  Large caliber vessel with mild irregularities.  2. LAD:  Large caliber vessel.  Provides a diagonal branch in the proximal     segment.  The LAD has mild diffuse disease of 30%.  3. Left circumflex artery is a medium caliber vessel that provides a     marginal branch in its proximal  segment that may be also considered an     intermediate artery and several distal posterior ventricular branches.     The mid AV circumflex has severe narrowing of 70% in the area of previous     stent placement.  The first marginal branch has narrowings of 70% in the     proximal segment.  It is then 100% occluded distally in a previous stent.     The distal posterior ventricular branches have moderate diffuse disease     of 40%.  4. Right coronary artery:  Dominant.  This is a medium caliber vessel.     Provides posterior descending artery and posterior ventricular branch in     terminal segment.  The right coronary artery is 100% occluded proximally.     The PDA and posterior ventricular branch fill via collaterals from the     left circumflex system.  5. LV:  Normal end-systolic and end-diastolic  dimensions.  Overall left     ventricular function is well preserved.  Ejection fraction greater than     55%.  No mitral regurgitation.  LV pressure is 130/10.  Aortic is 130/85.     LVEDP equals 30.   With these findings we elected to proceed with percutaneous intervention to  the mid AV circumflex.  The patient had been pre treated with aspirin and  Plavix and was given heparin to maintain ACT approximately 300 seconds.  A 6-  [place] CLS4 guide catheter was used to engage the left coronary artery.  A  0.014 injection port wire advanced in the distal section of the circumflex  artery.  A 2.75 x 6 mm cutting balloon was then introduced and multiple  inflations performed in the mid AV segment at up to 10 atmospheres for 45  seconds.  Repeat angiography showed significant improvement in the vessel  lumen and flow with less than 10% residual narrowing and TIMI grade 3 flow.  There was no evidence of distal vessel damage.  The wire was then  repositioned in the first marginal branch and brief attempt made to probe  this artery.  This wire proved unsuccessful and using a 2.5 x 12 mm Voyager   balloon for support a Cross-It 300 wire as well as an Office manager  were introduced.  Both were used to probe the occlusion of the distal  section of the first marginal branch that had previously been stented.  However, the wire could not be advanced through the occlusion.  The Voyager  balloon was inflated at 6 and 8 atmospheres for 30 seconds in the proximal  segment.  However, watermelon seeding was noted and the balloon was not  stabilized appropriately on this lesion.  Due to insignificant distal runoff  it is felt that further intervention would not be beneficial and we  retracted the balloon and wire.  Repeat angiography showed no vessel damage.  There was significant improved flow through the AV circumflex in the distal  branch as well as the PDA and posterior ventricular branch.  This was deemed  an acceptable result.  The guide catheter was then removed and the sheath  secured in position.  The patient tolerated procedure well and was  transferred to the floor in stable condition.   FINAL RESULTS:  1. Successful PTCA of the mid AV circumflex with reduction of 70% in-stent     restenosis to less than 10% using a 2.75 mm cutting balloon.  2. Unsuccessful attempt at recannulization occluded first marginal branch     with 100% in-stent restenosis.                                               Veneda Melter, M.D.    NG/MEDQ  D:  10/10/2003  T:  10/11/2003  Job:  [id number]   cc:   Andrew [facility], M.D. [facility]

## 2011-04-09 NOTE — Cardiovascular Report (Signed)
Andrew West  Patient:    Andrew West, Andrew West Visit Number: [id number] MRN: [id number]          Service Type: Attending:  Veneda West, M.D. Dictated by:   Andrew West, M.D. Proc. Date: 10/09/01   CC:         Andrew West, M.D. Andrew West   Cardiac Catheterization  PROCEDURE: 1. Left heart catheterization 2. Left ventriculogram. 3. Selective coronary angiography. 4. Percutaneous transluminal coronary angioplasty and stenting of the    left circumflex artery. 5. Direct current cardioversion.  DIAGNOSES: 1. Three-vessel coronary artery disease. 2. Normal left ventricular systolic function. 3. Status post non-Q-wave myocardial infarction. 4. Catheter-induced ventricular fibrillation.  CARDIOLOGIST:  Andrew West, M.D.  HISTORY:  Andrew West is a 45 year old black male with a history of tobacco use and hypertension as well as a strong family history of early coronary artery disease, who presents with substernal chest discomfort with radiation to the left arm.  The patient was admitted to the [facility], stabilized medically, but subsequently ruled in for non-Q-wave myocardial infarction.  He had ST depression in the inferolateral leads.  He presents now for further assessment.  TECHNIQUE:  After informed consent was obtained, the patient was brought to the catheterization lab.  A 6-French sheath was placed in the right femoral artery.  Left heart catheterization, left ventriculogram as well as selective coronary angiography were then performed in the usual fashion using preformed 6-French Judkins catheters.  The patient did develop ventricular fibrillation with engagement and injection of the right coronary artery using a JL4 catheter.  This was withdrawn, and a 6-French special right catheter used for repeat angiography.  Left ventriculogram was performed using power injection of contrast.  INITIAL FINDINGS: 1. Left main trunk: A large caliber  vessel with distal irregular narrowing of    up to 15 to 20%. 2. LAD.  This is a large caliber vessel that provides two small diagonal    branches.  The LAD has a focal narrowing of 40% in the mid section.  There    is then diffuse disease of 30% in the distal segment of the vessel.  The    two diagonal branches are small caliber vessels with moderate diffuse    disease of 30 to 50%. 3. Ramus Intermedius: This is a large caliber vessel that has a long stenosis    of 70% in the mid section.  The vessel then trifurcates distally and has    moderate disease of 50%. 4. Left circumflex artery:  The circumflex is dominant.  This begins as a    large caliber vessel and tapers in its mid section.  It provides two    trivial marginal branches in the proximal segment.  The A-V circumflex is    then 99% occluded with TIMI-2 flow distally.  Later we will see three    posterolateral branches as well as a posterior descending artery with    diffuse disease of 50 to 60%.  The distal left circumflex artery fills via    collaterals from the right coronary artery. 4. Right coronary artery is nondominant.  This is a medium caliber vessel that    provides at least two RV marginal branches.  The right coronary artery has    a long segment of occlusion.  After the first RV marginal branch, there is    extensive collateralization seen in the proximal vessel to the distal    vasculature including the left circumflex artery with  filling of the    posterolateral branches.  The second RV marginal branch appears to fill via    collaterals from the apical LAD. 5. LV: Normal end-systolic and end-diastolic dimensions.  Overall left    ventricular function appears well preserved.  Ejection fraction 85%.  There    is no mitral regurgitation.  Mild hypokinesis of the basilar inferior West    is noted.  LV pressure is 110/5.  Aortic pressure is 110/80.  LV EDP = 15.  INTERVENTION PROCEDURE:  These findings were reviewed  with the patient, and we elected to proceed with percutaneous intervention to the left circumflex artery.  The 6-French sheat was exchanged for a 7-French sheath, and a 7-French Voda left 4 guide catheter was used to engage the left coronary artery.  The patient had previously been on heparin and Integrilin.  These were supplemented to maintain ACT greater than 250 seconds.  A 0.014 Forte wire was then introduced and passed into the A-V circumflex. Due to acute takeoff of the circumflex artery from the LAD, a significant bend was necessary to engage the vessel.  This then proved difficult to pass through the A-V circumflex.  A 2.5 x 9 mm Maverick balloon was introduced and used to exchange the wire for a long Export wire.  This was then used to probe the subtotal occlusion in the mid A-V circumflex and successfully advance into the distal vessel.  The balloon was then inflated on two occasions within the site of occlusion at 6 atmospheres for 30 seconds, and a single inflation was also performed at 8 atmospheres for 60 seconds.  Repeat angiography initially suggested a good result; however, after the wire was withdrawn, there was significant stenosis of 70 to 80% at the site of occlusion felt due to vessel recoil and possibly damage.  Imaging of the vessel was inadequate to determine which.  The lesion was then recrossed and a 2.5 x 13 mm Pixel stent introduced.  This was positioned just distal to the small second marginal branch, extending up to the first large posterolateral branch and deployed at 12 atmospheres for 60 seconds.  Repeat angiography was now performed after administration of intracoronary nitroglycerin showing an excellent result with full coverage of the lesion, no residual stenosis, and TIMI-3 flow through the distal vasculature.  Final angiography was performed in various projections confirming no distal vessel damage.  The guide catheter was then removed.  The sheath  was secured in position, and the patient was transferred to the holding  area in stable condition.  Sheath will be removed when the ACT returns to normal.  He tolerated the procedure well.  FINAL RESULTS:  Successful PTCA and stenting of the mid A-V circumflex with reduction of subtotal narrowing to 0% with placement of a 2.5 x 13 mm Pixel stent. Dictated by:   Andrew [facility], M.D. Attending:  Veneda West, M.D. DD:  10/09/01 TD:  10/09/01 Job: [id number]

## 2011-04-09 NOTE — Discharge Summary (Signed)
[place]  Patient:    Andrew West, Andrew West Visit Number: [id number] MRN: [id number]          Service Type: MED Location: 2000 2012 01 Attending Physician:  Junious Silk Dictated by:   Pennelope Bracken, N.P. Admit Date:  03/04/2002 Discharge Date: 03/06/2002   CC:         Veneda Melter, M.D. [facility]   Discharge Summary  DATE OF BIRTH:  [birth year]  CARDIOLOGIST:  Veneda Melter, M.D.  REASON FOR ADMISSION:  Chest pressure.  DISCHARGE DIAGNOSES: 1. Coronary artery disease, status post myocardial infarction November 2002,    and placement of five stents within six months, last being placed    March 2003.  Angiography this admission revealing ejection fraction of 80%    with arterial stenoses as follows:  Left main 20%; LAD 30% proximal, 30%    mid vessel, 20-30% distal.  In-stent seemed to be widely patent.  OM-2 had    a subtotal occlusion secondary to previous stent placement.  OM-3 had a 50%    ostial lesion.  OM-4 had an 80% small ostial lesion, and RCA 100% proximal    with left and right collateralization.  Stents to the circumflex were    widely patent. 2. Hypertension. 3. Strong family history of early coronary artery disease, brother with PTCA    at 13.  Father with an myocardial infarction at 82. 4. History of mild elevation of blood sugars.  HISTORY OF PRESENT ILLNESS:  This delightful 45 year old gentleman with history as outlined above, presented to [place] for evaluation in the E.D. with complaint of 3/10 chest pressure felt on the left anterior thoracic area. This was similar to previous episodes of chest pain which had culminated in interventions.  He denied nausea, vomiting, shortness of breath, but did experience some mild dizziness.  Given his extensive cardiac history, it was elected prudent to admit him.  HOSPITAL COURSE:  The patient was admitted in stable condition and continued on his home medications which  included Plavix.  Serial EKG and enzymes were followed.  He was placed on Lovenox.  Cardiac coronary angiographic findings were as detailed above.  The patient tolerated the catheterization well and, Drs. Eden Emms and Chales Abrahams reviewed the patients films and decided that the occluded branches of the OM and PL were too small to perform angioplasty on, so it was elected to continue with medical therapy.  PHYSICAL EXAMINATION:  Physical examination on day of discharge, the patient offered no complaints of chest pain or dyspnea.  VITAL SIGNS:  Blood pressure 130/78, pulse 84, respirations 20, temperature 98.7, pulse oxygen 97% on room air.  GENERAL:  The patient is in no acute distress.  CARDIOVASCULAR:  Regular rate and rhythm, clear S1, S2.  LUNGS:  Clear to auscultation bilaterally.  EXTREMITIES:  Without edema.  Cath site is without sign of hematoma or bruit.  LABORATORY DATA:  Cardiac enzymes set #1:  CK 705, MB 4.2, troponin I 0.01. Set #2:  CK 532, MB 2.5, troponin I 0.01.  Chemistry prior to discharge showed sodium 141, potassium 3.3, chloride 107, CO2 27, BUN 13, creatinine 0.8, glucose 109.  Discharge hemogram revealed WBC 7.1, hemoglobin 12.2, hematocrit 36.0, platelets 256.  Lipid panel showed a total cholesterol of 116, triglycerides 187, HDL low at 25 and LDL 54.  12-lead EKG on admission showed sinus bradycardia with nonspecific T wave changes.  DISPOSITION:  The patient is discharged to home in the care  of his fiance.  DISCHARGE MEDICATIONS: 1. Aspirin 325 mg 1 q.d. 2. Plavix 75 mg 1 q.d. 3. Lopressor 25 mg b.i.d. 4. Prinivil 10 mg 1 q.d. 5. Protonix 40 mg b.i.d. 6. Advicor 500/20, 1 q.d.  ACTIVITY RESTRICTIONS:  No heavy lifting, driving, sexual, or strenuous activity for two days.  The patient is written out of work until Monday, March 12, 2002.  DIET:  Recommended low fat, low salt.  WOUND CARE:  The patient is to call the office especially if the groin  becomes hard or painful.  FOLLOW-UP:  As scheduled with Dr. Chales Abrahams on Mar 26, 2002.  This was a preexisting appointment.  He knows to call in the interim with any problems, questions, or concerns or change or increase in symptoms. Dictated by:   Pennelope Bracken, N.P. Attending Physician:  Junious Silk DD:  03/06/02 TD:  03/06/02 Job: [id number]

## 2011-04-09 NOTE — Op Note (Signed)
NAME:  MARQEL, COZAD              ACCOUNT NO.:  [id number]   MEDICAL RECORD NO.:  [id number]          PATIENT TYPE:  OIB   LOCATION:  2550                         FACILITY:  [facility]   PHYSICIAN:  Abigail Miyamoto, M.D. DATE OF BIRTH:  January 15, 1966   DATE OF PROCEDURE:  08/13/2004  DATE OF DISCHARGE:  08/13/2004                                 OPERATIVE REPORT   PREOPERATIVE DIAGNOSIS:  Anal pain.   POSTOPERATIVE DIAGNOSES:  1.  Posterior and anterior anal fissures.  2.  Anal polyps.  3.  Anal skin tag.   OPERATION PERFORMED:  1.  Examination under anesthesia.  2.  Excision of anal skin tag and anal polyps.  3.  Sphincterotomy.   SURGEON:  Abigail Miyamoto, M.D.   ANESTHESIA:  General endotracheal anesthesia and half percent with Marcaine  with epinephrine.   ESTIMATED BLOOD LOSS:  The estimated blood loss was minimal.   FINDINGS:  The patient was found to have both anterior and posterior anal  fissures.  He had a very large posterior anal skin tag and two anal polyps;  one on the anterior location and one on the left lateral location.  No other  intra-abdominal pathology or abscesses were identified.   OPERATION IN DETAIL:  The patient was brought to the operating room and  identified as Wyona Almas.  He was placed supine on the operating table  and general anesthesia was induced.  The patient was then placed in the  prone position.  His perineum was prepped and draped in the usual sterile  fashion.  The buttocks were then taped widely apart.   Upon visual examination the patient had a large posterior anal skin tag on a  small stalk.  The Hill-Ferguson retractor was inserted into the anal canal  and the anal canal was examined circumferentially.  He had a moderate-sized  polyp in the extreme anterior location and a smaller polyp on the left  lateral location.  Both were benign in appearance and were excised with  electrocautery, and sent to pathology for identification.   No other intra-  anal pathology was identified other than an anterior and a posterior anal  fissure.   The posterior skin tag was then grasped with an Allis clamp and excised with  the electrocautery as well.  At this point decision was made to proceed with  the sphincterotomy.   A right lateral incision was then created with a 15 blade scalpel.  The  incision was carried down with the hemostat to the sphincter muscles,  grabbed with Allis clamp.  The sphincter muscles were then partly transected  creating the sphincterotomy.  The open wound was then closed with two  separate 2-0 chromic sutures.  The perianal area was then anesthetized with  half percent Marcaine with epinephrine.  A piece of Gelfoam was then  inserted into the anal canal.   The patient was then placed back into a supine position.  He was extubated  in the operating room and stable condition to the recovery room.  \  All sponge, needle and instrument counts were correct at the  end of the  procedure.       DB/MEDQ  D:  08/13/2004  T:  08/14/2004  Job:  [id number]

## 2011-04-09 NOTE — H&P (Signed)
[place]  Patient:    Andrew West, Andrew West Visit Number: [id number] MRN: [id number]          Service Type: MED Location: [phone] Attending Physician:  Talitha Givens Admit Date:  02/10/2002 Discharge Date: 02/13/2002                           History and Physical  HISTORY OF PRESENT ILLNESS:  The patient is a 45 year old black male with a history of CAD, status post PTCA in November of 2002, which consisted of a stent to the circumflex.  Then he had another stent placed in January of 2002 to the circumflex, and then he had three stents placed in March of 2003 with two stents to the ramus and one stent in the obtuse marginal.  He also has a history of tobacco and hypertension, and a strong family history.  He presents today to the emergency room with 3 out of 10 chest pressure.  It is on and off, and lasting up to 15 minutes throughout the day.  Mild relief with nitroglycerin.  He describes the pain as similar to what he had in both January and March of this year.  He denies any nausea or vomiting, but he does complain of mild shortness of breath and mild dizziness.  No syncope.  No PND. He denies orthopnea and he quit tobacco in November of 2002.  He describes the chest discomfort as a pressure.  His last catheterization in March of 2003 showed a ramus at 70% and at 90%, but his circumflex at 50% to 60%.  His right coronary artery was 100%, which was nondominant, and his LAD showed luminal irregularities with an EF of 60%.  PAST MEDICAL HISTORY:  As above, plus hypertension, ex-tobacco user, and hypercholesterolemia.  MEDICATION LIST: 1. Plavix 75 mg p.o. q.d. 2. Atenolol 25 mg p.o. q.d. 3. Monopril 10 mg p.o. q.d. 4. Advicor 500/20 mg p.o. q.d. 5. Aspirin 325 p.o. q.d.  FAMILY HISTORY:  He has a brother with PTCA when he was 59, and a father with an MI at age 5.  SOCIAL HISTORY:  He is married.  He has seven children.  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  He denies any alcohol, IV drug use, and he quit tobacco in November.  REVIEW OF SYSTEMS:  Positive for mild headaches and dizziness.  Negative for cough.  Positive shortness of breath.  Negative for any GI symptoms other than increased gas.  Negative for GU.  He does have some mild abdominal pain. Negative for musculoskeletal.  Negative for psychiatric.  PHYSICAL EXAMINATION:  VITAL SIGNS:  Blood pressure is 120/69, heart rate is 72, respiratory rate is 18.  He is saturating 99% on room air.  NECK:  There is no JVD.  Neck is supple.  No hepatojugular reflex.  No bruits. There is 2+ carotid upstroke.  LUNGS:  Clear to auscultation bilaterally with no presacral edema.  No costovertebral angle tenderness.  CARDIAC:  PMI is nonpalpable.  Normal S1 and S2.  Regular rate and rhythm. Short 1/6 systolic ejection murmur at the upper left sternal border.  ABDOMEN:  Abdomen is soft, nontender, nondistended.  No hepatosplenomegaly. No bruits.  EXTREMITIES:  He has no clubbing, cyanosis, or edema, with 2+ distal pulses and no femoral bruits.  LABORATORY EVALUATION:  White count of 8.5, hematocrit 38.  CK  is 700 with an MB of 4.2 and a troponin of 0.01.  Creatinine of 0.8.  ECG shows normal sinus rhythm, right axis with early repole ST.  ASSESSMENT AND PLAN:  This is a 45 year old African American male with coronary artery disease, status post myocardial infarction in November 2002, and five stents within the past six months, the last one in March of 2003, as well as hypertension.  He is presenting with unstable angina and unremarkable ECG with negative initial sets of cardiac enzymes.  The plan will be to admit to telemetry, follow serial ECGs and enzymes.  We will start him on Lovenox, beta-blocker, statin, aspirin, ACE inhibitor, and Plavix.  If the troponin come back positive, we will then start glycoprotein 2b/3a at that  point.  Plan for cardiac catheterization in the morning.  If this is negative, then consider GI workup.  Also we will check a UA, hemoglobin A1-c, and a fasting blood glucose as he had mild elevation of blood sugars in the past. Attending Physician:  Talitha Givens DD:  03/04/02 TD:  03/05/02 Job: [id number]

## 2011-04-09 NOTE — Discharge Summary (Signed)
NAME:  Andrew West, Andrew West                        ACCOUNT NO.:  [id number]   MEDICAL RECORD NO.:  [id number]                   PATIENT TYPE:  INP   LOCATION:  3008                                 FACILITY:  [facility]   PHYSICIAN:  Rene Paci, M.D. [facility]          DATE OF BIRTH:  [birth year]   DATE OF ADMISSION:  03/18/2003  DATE OF DISCHARGE:  03/20/2003                                 DISCHARGE SUMMARY   DISCHARGE DIAGNOSES:  1. Myalgias.  2. Myositis.  3. New diagnosis of diabetes.  4. Urinary tract infection.   BRIEF HISTORY:  The patient is a 45 year old African-American male who  presented with a three-day history of diffuse myalgias.  He states that he  has been having pain in his arms and legs, noting the pain had become  dramatically worse on the night of admission.  This has been a throbbing  pain with an intensity of 8/10.   PAST MEDICAL HISTORY:  1. Cardiovascular status post MI with PTCA in 2002.  2. Hypertension.  3. Hyperlipidemia.  4. Questionable history of glucose intolerance.   HOSPITAL COURSE:  1. MYOSITIS WITH MYALGIAS AND ELEVATED CK:  Likely secondary to Zocor     plus/minus UTI.  The patient's Zocor was discontinued, and he has been     aggressively hydrated.  The patient's renal function is preserved, and     total CK is normalizing.  Overall, the patient is improving.  LFTs were     normal.   1. INFECTIOUS DISEASE:  The patient is afebrile, white count normal.     Urinalysis is consistent with urinary tract infection.  Cultures are     still pending, and this is his second of seven days of Cipro.   1. CARDIOVASCULAR:  The patient has known coronary disease status post MI     and PTCA but is currently stable.   1. HYPERLIPIDEMIA:  It is noted that patient's Zocor has been discontinued.     His LFTs are normal.  He will probably need another kind of cholesterol-     lowering agent, but we will defer this to primary MD.   1. ADULT-ONSET DIABETES  MELLITUS:  This is apparently a new diagnosis for     the patient, although it has been suspected in the past.  His hemoglobin     A1C was 8.2%.  CBGs were 300 to 200.  The patient was started on     Glucotrol XL here.  We have arranged for him to have a home Glucometer,     education on diabetic diet, as well as outpatient followup at the     [facility] diabetes management center.  The patient requested to be     followed by a physician at the [facility] office as this is more     convenient for him.  We have arranged for the patient to follow up with  Dr. Everardo All.   DISCHARGE LABORATORY DATA:  Lipase and amylase were normal.  CMET was  normal.  CBC was normal.  TSH was 3.492, T4 4.6, rheumatoid factor less than  20, sed rate 10.  Lyme disease antibody was negative.  Hemoglobin A1C 8.2%.   DISCHARGE MEDICATIONS:  1. Plavix 75 mg daily.  2. HCTZ, Toprol XL, and Monopril as at home.  3. Aspirin 325 mg daily.  4. Glucotrol XL 5 mg daily.  5. He has been instructed not to take Zocor.   FOLLOW UP:  He is to follow up wiht Dr. Everardo All on Friday, May 14, at 1:30  p.m.     Cornell Barman, P.A. [facility]                  Rene Paci, M.D. [facility]    LC/MEDQ  D:  03/20/2003  T:  03/20/2003  Job:  [id number]   cc:   Gregary Signs A. Everardo All, M.D. [facility]

## 2011-04-09 NOTE — Letter (Signed)
July 24, 2009    Mr. Wille L. Colina  [address] [place], [place] [address]   RE:  KYAL, ARTS  MRN:  [id number]  /  DOB:  11-Dec-1965   Dear Mr. Rottenberg,   It has become apparent that despite our best efforts treating your  problems you have not been willing to follow through on recommended  procedures and followup evaluation.  I therefore cannot continue to  provide care for you.  Therefore at this time I declined to continue as  your cardiologist.  I do think you need further care for your medical  problems and I will be available for the next 30 days from the receipt  of this letter for emergent cardiac treatment.  After that time, you are  dismissed as an active patient from [facility] and you will need  to establish with a new physician.  Please feel free to contact our  Medical Records Department for any records that will need to be  forwarded to a new cardiologist.  I sincerely regret that I have felt  the need to come to this decision.    Sincerely,      Madolyn Frieze. Jens Som, MD, [facility]  Electronically Signed    BSC/MedQ  DD: 07/24/2009  DT: 07/24/2009  Job #: [id number]

## 2011-04-09 NOTE — Discharge Summary (Signed)
NAME:  Andrew West, Andrew West                        ACCOUNT NO.:  [id number]   MEDICAL RECORD NO.:  [id number]                   PATIENT TYPE:  OIB   LOCATION:  6527                                 FACILITY:  [facility]   PHYSICIAN:  Veneda Melter, M.D.                   DATE OF BIRTH:  [birth year]   DATE OF ADMISSION:  10/10/2003  DATE OF DISCHARGE:  10/11/2003                                 DISCHARGE SUMMARY   DISCHARGE DIAGNOSES:  1. Coronary artery disease, status post percutaneous transluminal coronary     angioplasty to the arteriovenous circumflex on October 10, 2003.  2. Diabetes mellitus, on oral agent.  3. Hyperlipidemia, treated.  4. Hypertension, treated.  5. Family history.   Mr. Bednarczyk is a 45 year old male patient with a history of coronary artery  disease as well as a prior inferior infarct.  He has had a prior PCI of his  circumflex in that setting.  In addition, he has also had a PCI of his OM I.  He underwent a Cardiolite for recurrent chest pain on September 13, 2003, and  this revealed mild-to-moderate ischemia of the inferolateral wall at the  base of midventricular level.  Wall motion was normal and the EF was 64%.  The patient was then brought to the cardiac catheterization lab on October 10, 2003, and he was found to have the known totally occluded  right  coronary artery.  The circumflex had a 70% stenosis at the mid AV groove,  OM I 70% followed by a totalled vessel.  The left system had 30% stenosis  with left main with mild disease.  The EF was greater than 55% with no MR.  The patient then underwent a PTCA of the left circumflex, reducing the  lesion to a less than 10% lesion post-procedure.  The patient remained in  the hospital overnight and remained stable.   His lab work on discharge is BUN 9, creatinine 0.8.  Hemoglobin 11.6,  hematocrit 35.6.   Blood pressure 140/80, pulse 90, respirations 18.  O2 saturation is 94% on  room air.   He does complain of  some right groin tenderness, although this area has no  swelling.  There is no bruit, no ecchymosis, and there are palpable right  posterior tibialis pulses.   At this point, the patient will be discharged to home on his same  medications as prior to admission, including Plavix 75 mg daily, sublingual  nitroglycerin p.r.n. chest pain, Toprol XL 50 mg 1/2 tablet daily, HCTZ 25  mg daily, Monopril 10 mg daily, Zocor 40 mg 1 tablet every day, aspirin 325  mg daily, Aciphex 20 mg daily, Rhinocort as well as Neurontin.  He is to  restart his Glucophage on October 13, 2003.  He may utilize Tylenol 1-2  tablets every 6 as needed for pain.   No strenuous activity, driving, or lifting over 10  pounds for 2 days, then  gradually increase activity.  Remain on a low fat, diabetic diet.  Clean  over cath site with soap and water, no scrubbing.  Call for any questions or  concerns.  Follow up for groin check on October 18, 2003 at 12 noon.      Guy Franco, P.A. [facility]                      Veneda Melter, M.D.    LB/MEDQ  D:  10/11/2003  T:  10/12/2003  Job:  [id number]   cc:   Gregary Signs A. Everardo All, M.D. [facility]

## 2011-04-09 NOTE — Discharge Summary (Signed)
[place]. [facility]  Patient:    Andrew West, Andrew West Visit Number: [id number] MRN: [id number]          Service Type: MED Location: 3700 [id number] Attending Physician:  Veneda Melter Dictated by:   Brita Romp, P.A.C. Admit Date:  10/09/2001 Discharge Date: 10/10/2001   CC:         [facility]   Discharge Summary  DISCHARGE DIAGNOSES: 1. Non-Q-wave myocardial infarction; status post cardiac catheterization with    intervention. 2. Tobacco abuse. 3. Hypertension. 4. Significant family history of coronary artery disease.  HOSPITAL COURSE:  Andrew West is a pleasant 45 year old male with no known history of coronary artery disease.  He presented to the emergency room shortly after midnight on October 09, 2001, complaining of approximately 2/10 epigastric pain without radiation and not associated with shortness of breath. On arrival to the emergency room, the patient received a single nitroglycerin which relieved his pain.  He was seen and admitted by Dr. Jonny Ruiz __________, who felt that the patients symptoms were consistent with an acute coronary syndrome and started him on Lovenox, aspirin, Integrelin, beta-blocker.  Later that day, the patient was taken to the cath lab by Veneda Melter, M.D. Catheterization results: 1. Left main coronary artery:  Distal 15 to 20% stenosis. 2. Left anterior descending:  Two diagonals; 40% lesion in the mid vessel    followed by 30% lesion distally. 3. Ramus intermedius:  60 to 70% in the mid vessel. 4. Left circumflex:  Dominant; 99% lesion in the mid AV circumflex with    TIMI-2 flow; this lesion was subsequently angioplastied and was stented    down to a 0% residual with TIMI-3 flow. 5. Right coronary artery:  Nondominant; total occlusion in the mid vessel with    collateral circulation. 6. Left ventricle:  Mild inferior hypokinesis, no mitral regurgitation,    ejection fraction greater than  55%.  The following morning, the patient was doing well and had no chest pain or shortness of breath.  He was mildly hypokalemic at 3.3 and repletion was ordered.  Otherwise, Dr. Chales Abrahams felt the patient was stable for discharge.  DISCHARGE MEDICATIONS: 1. Enteric coated aspirin 325 mg q.d. 2. Plavix 75 mg q.d. 3. Toprol XL 50 mg q.d. 4. Zocor 20 mg q.p.m. 5. Nitroglycerin as needed. 6. Lisinopril 10 mg q.d.  The patient was instructed to stop by the [facility] office for samples of both Plavix and Zocor.  LAB VALUES:  White count 9.9, hemoglobin 13.6, hematocrit 40.7, platelets 244. Sodium 138, potassium 3.3, chloride 103, CO2 28, BUN 6, creatinine 0.8, glucose 108.   CK 410 with MB 8.5; of note, these were both trending downwards.  Chest x-ray showed no acute process.  Electrocardiogram showed sinus rhythm at 69, PR interval 168, QRS 100, QTC 450 with an axis of 53. Dictated by:   Brita Romp, P.A.C. Attending Physician:  Veneda Melter DD:  10/10/01 TD:  10/10/01 Job: [id number]

## 2011-04-09 NOTE — Discharge Summary (Signed)
[place]. [facility]  Patient:    Andrew West, Andrew West Visit Number: [id number] MRN: [id number]          Service Type: MED Location: 6500 [id number] Attending Physician:  Talitha Givens Dictated by:   Guy Franco, P.A.-C. Admit Date:  02/10/2002                    Referring Physician Discharge [place]  DISCHARGE DIAGNOSES: 1. Coronary artery disease, status post PCI to the circumflex, intermediate x 2, obtuse marginal x 1 by Dr. Charlies Constable, on February 12, 2002. 2. Hypertension. 3. Hypercholesterolemia. 4. Tobacco abuse.  HOSPITAL COURSE:  [name] is a 45 year old male patient, with known coronary artery disease, who underwent a PCI of the mid circumflex in January 2003.  He was admitted on February 11, 2002, with substernal chest pain.  He ruled out for myocardial infarction by enzymes; however, he continued to have pain and with his known coronary disease, he underwent cardiac catheterization.  At that time, he was noted to have normal left main, LAD with luminal irregularities, circumflex with a stent had some luminal irregularities.  There was a 50-60% distal stenosis prior to the small PL branch.  Ramus intermedius was large with a mid 70% lesion, distal 90% lesion, and the right coronary artery had 100% proximal lesion.  EF was 60%. Dr. Juanda Chance then proceeded with stent placement to the circumflex intermediate x 2 and the circumflex and OM x 1.  He was not able to be on Integrilin secondary to gum bleed.  He tolerated the procedure well and remained stable overnight and was ready for discharge to home.  DISCHARGE MEDICATIONS: 1. Enteric-coated aspirin 325 mg 1 p.o. q.d. 2. Zocor 20 mg 1 p.o. q.h.s. 3. Plavix 75 mg 1 p.o. q.d. 4. Nitroglycerin p.r.n. chest pain 5. Toprol XL 50 mg 1 p.o. q.d. 6. Prinivil 10 mg 1 p.o. q.d.  Do not resume hydrochlorothiazide 25 mg 1 p.o. q.d.  DISCHARGE INSTRUCTIONS: 1. No strenuous activity.  No work until February 20, 2002. 2. Low fat diet. 3. Clean over cath site with soap and water. 4. Call for questions. 5. He has an appointment at [facility] on February 26, 2002, at 11 a.m.  LABORATORY STUDIES:  White count 7.9, hemoglobin 12.0, hematocrit 35.9, platelets 263.  Sodium 143, potassium 3.5, BUN 13, creatinine 0.8.  CK-MBs negative.  Drug screen negative.  Upon follow-up, the patient will need to have a CBC to assure that his hemoglobin remains stable. Dictated by:   Guy Franco, P.A.-C. Attending Physician:  Talitha Givens DD:  02/13/02 TD:  02/13/02 Job: [id number]

## 2011-04-09 NOTE — Assessment & Plan Note (Signed)
[facility]                            CARDIOLOGY OFFICE NOTE   Lysle, Smolinski BOOTS VALERA                     MRN:          [id number]  DATE:03/10/2007                            DOB:          06/29/1966    HISTORY OF PRESENT ILLNESS:  Andrew West is a 45 year old gentleman with  coronary disease in the setting of diabetes, hypertension, and  dyslipidemia.  He has a long segment chronic total occlusion of the  dominant RCA with substantial collateral supply from the left.  Left  ventricular systolic function is normal.  He has undergone prior  stenting in the circumflex and ramus intermedius.   In the six months since I last saw Mr. Glenny, he has been having more  chest discomfort.  This continues to occur primarily without exertion.  However, it has become a daily phenomenon.  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  However, he feels that this discomfort is much  more worrisome than what he has been troubled by over the past couple of  years.   CURRENT MEDICATIONS:  1. Plavix 75 mg per day.  2. Aspirin 325 mg per day.  3. K-Dur 20 mEq daily.  4. Glucophage 1000 mg twice daily.  5. Toprol XL 150 mg daily.  6. Niaspan 1000 mg daily.  7. Omeprazole 20 mg daily.  8. Actos 45 mg daily.  9. Pravastatin 80 mg q.h.s.  10.Imdur 30 mg twice daily.  11.Quinapril hydrochlorothiazide 20/12.5 one twice daily.   PHYSICAL EXAMINATION:  GENERAL:  He is overweight but otherwise well-  appearing, in no distress.  VITAL SIGNS:  Heart rate 73.  Blood pressure 100/72.  Weight 277 pounds.  NECK:  He has no jugular venous distention, thyromegaly, or  lymphadenopathy.  LUNGS:  Clear to auscultation.  Respiratory effort is normal.  HEART:  He has a nondisplaced point of maximal cardiac impulse.  There  is a regular rate and rhythm without murmur, rub, or gallop.  ABDOMEN:  Soft, nondistended, nontender.  There is no  hepatosplenomegaly.  Bowel sounds are  normal.  EXTREMITIES:  Warm without cyanosis, clubbing, edema, or ulceration.   Electrocardiogram demonstrates normal sinus rhythm and is a normal EKG.   IMPRESSION/RECOMMENDATIONS:  1. Coronary disease:  Worsening chest pain that may be angina.  We      will repeat Cardiolite to compare it with the one from February.      If there is a large area of ischemia, we will plan cardiac      catheterization.  However, he is resistant to the idea of      catheterization.  Otherwise, might consider ranolazine versus      increased beta blockade.  2. Diabetes mellitus:  Per Dr. Everardo All.  3. Hypertension:  Blood pressure nicely controlled.  4. Hypercholesterolemia:  Managed by Dr. Everardo All.  Good control as of      January.     Salvadore Farber, MD  Electronically Signed    WED/MedQ  DD: 03/10/2007  DT: 03/10/2007  Job #: [id number]   cc:  Sean A. Everardo All, MD

## 2011-04-09 NOTE — H&P (Signed)
NAME:  QUENNEL, FAWBUSH                        ACCOUNT NO.:  [id number]   MEDICAL RECORD NO.:  [id number]                   PATIENT TYPE:  INP   LOCATION:  3008                                 FACILITY:  [facility]   PHYSICIAN:  Valetta Mole. Swords, M.D. [facility]           DATE OF BIRTH:  05-15-1966   DATE OF ADMISSION:  03/18/2003  DATE OF DISCHARGE:                                HISTORY & PHYSICAL   CHIEF COMPLAINT:  Hurting all over.   HISTORY OF PRESENT ILLNESS:  The patient is a 45 year old male with a  complicated medical history, who presents with a three-day history of  diffuse myalgias.  He states his arms and legs have been hurting, legs  greater than arms.  Last night, pain became dramatically worse.  He  describes this as a throbbing pain, intensity 8/10.  He knows of no  modifying factors except for Tylenol with minimal relief.  He denies any  fevers or chills, dysuria.   PAST MEDICAL HISTORY:  Past medical history is significant for myocardial  infarction in 2002.  He is also treated for hypertension and hyperlipidemia.   CURRENT MEDICATIONS:  Current medications include aspirin, Plavix,  hydrochlorothiazide, Zocor, Toprol, Monopril.  The patient does not know the  doses of any of his medicines.   FAMILY HISTORY:  Mother alive with hypertension and diabetes; father  deceased with a myocardial infarction at age 29; he had a brother with a  PTCA at age 50.   REVIEW OF SYSTEMS:  He denies any chest pain, shortness of breath, PND,  orthopnea, GI blood loss, dysuria, fevers, chills, rashes, joint pains or  any other complaints in the review of systems.   SOCIAL HISTORY:  He is married.  He has seven children.  He does not drink  alcohol.  He quit smoking at the time of his heart attack.   PHYSICAL EXAMINATION:  VITAL SIGNS:  Temperature 97.1, pulse 78,  respirations 20, blood pressure 124/78, saturations 93% on room air.  GENERAL:  In general, he appears as an overweight white  male in no acute  distress.  HEENT:  Atraumatic, normocephalic.  Extraocular muscles are intact.  NECK:  Neck is supple without lymphadenopathy, thyromegaly, jugular venous  distention or carotid bruits.  CHEST:  Chest is clear to auscultation.  CARDIAC:  S1 and S2 are normal without significant murmurs or gallops.  ABDOMEN:  Obese, active bowel sounds, soft, nontender.  There is no  hepatosplenomegaly and no masses are palpated.  EXTREMITIES:  There is no clubbing, cyanosis, or edema.  NEUROLOGIC:  He is alert and oriented, without any motor or sensory  deficits.  MUSCULOSKELETAL:  He has full range of motion of his shoulders, elbows,  hips, knees.  He is diffusely tender over his biceps, triceps.  He is  exquisitely tender over the calves bilaterally and moderately tender over  the thighs.  His pupils are equal and palpable  bilaterally.   LABORATORY DATA:  Urinalysis with 21 to 50 white blood cells per high-power  field.  Sedimentation rate is 10.  CK is 958.  Hemoglobin 12.4.  Glucose  185, potassium of 3.2.    ASSESSMENT AND PLAN:  1. Patient with diffuse myalgias on Zocor and elevated CK:  Some concern for     rhabdomyolysis.  I suspect his symptoms are related to Zocor.  We will     discontinue the Zocor.  We will hydrate him aggressively and follow his     renal function.  2. Pyuria:  This was treated in the emergency room with Tequin.  He has no     symptoms of a urinary tract infection or prostatitis.  We will continue     antibiotics until urine cultures are conclusive.  3. Coronary artery disease:  The patient is asymptomatic.  4. Hypokalemia:  We will replace.  Hypokalemia likely secondary to     hydrochlorothiazide.                                               Bruce Rexene Edison Swords, M.D. [facility]    BHS/MEDQ  D:  03/19/2003  T:  03/19/2003  Job:  [id number]   cc:   Titus Dubin. Alwyn Ren, M.D. [facility]

## 2011-04-09 NOTE — Assessment & Plan Note (Signed)
[facility]                                 ON-CALL NOTE   Andrew West, Andrew West                       MRN:          [id number]  DATE:12/18/2006                            DOB:          19-Aug-1966    PRIMARY CARDIOLOGIST:  Dr. Randa Evens.   Andrew West called today stating that he had an acute onset of shortness  of breath and tingling and paresthesias in his left arm.  I advised him  that if he thought he was having a heart attack, he should call 911 and  come to the emergency room.  He stated he was not sure he was having a  heart attack but he felt very bad.  I advised him that if he wished to  be evaluated, he would have to come to the emergency room.  He stated  that he would probably do that and I advised him that in order to be  safe he should come by EMS.  He stated that he did not have any idea  what was wrong but he just knew he had shortness of breath and he knew  his left arm felt a little numb and was tingling.  He stated he would  come to the emergency room for evaluation.      Theodore Demark, PA-C  Electronically Signed      Doylene Canning. Ladona Ridgel, MD  Electronically Signed   RB/MedQ  DD: 12/18/2006  DT: 12/18/2006  Job #: [id number]

## 2011-04-09 NOTE — Consult Note (Signed)
NAME:  Andrew West, Andrew West              ACCOUNT NO.:  [id number]   MEDICAL RECORD NO.:  [id number]          PATIENT TYPE:  EMS   LOCATION:  [facility]                         FACILITY:  [facility]   PHYSICIAN:  Bimal R. Sherryll Burger, MD      DATE OF BIRTH:  [birth year]   DATE OF CONSULTATION:  12/18/2006  DATE OF DISCHARGE:                                 CONSULTATION   PRIMARY CARE PHYSICIAN:  Dr. Rennis Harding with [place].   CARDIOLOGIST:  Dr. Randa Evens.   CHIEF COMPLAINT:  Shortness of breath.   HISTORY OF PRESENT ILLNESS:  This is a 45 year old gentleman with  history of coronary artery disease status post multiple PCIs with  stents, diabetes mellitus and hypertension with  2-day history of shortness of breath both at rest and with exertion. He  states that it is not worse with exertion, is just chronically there.  He denies any tightness across his chest. He denies any chest pain,  nausea, vomiting, diaphoresis or frank angina. He says this does not  feel at all like his chest pain when he had his prior heart attack.  Specifically he denies any fever, chills, cough, lower extremity edema,  orthopnea or sick contacts. He does endorse a headache here in the  emergency room. The patient states that last night he had three episodes  of shortness of breath while sleeping and he was gasping for breath. He  states that he has been compliant with his medications. Additionally he  denies any problems with palpitations, light headedness, dizziness or  presyncope.   REVIEW OF SYSTEMS:  Positive for a headache. Remaining 18-point review  of systems as above in the HPI, otherwise negative.   PAST MEDICAL HISTORY:  1. Coronary artery disease status post stent placement in November of      2002 and January of 2003. Please see previous notes for details and      locations of these stents. He had a stress-test in November, 2007      that showed mild lateral ischemia. At that point it was decided to      medically  manage him. He has significant right to left collaterals      given the chronically occluded RCA.  2. Hypertension.  3. Diabetes mellitus.  4. Hyperlipidemia.   ALLERGIES:  None.   CURRENT MEDICATIONS:  1. Plavix 75 mg daily.  2. Aspirin 325 mg daily.  3. Potassium chloride 20 mEq daily.  4. Imdur 30 mg daily.  5. Pravastatin 80 mg daily.  6. Actos 45 mg daily.  7. Omeprazole.  8. Niaspan 1 gram daily.  9. Toprol XL 150 mg daily.  10.Metformin 1 gram daily.   SOCIAL HISTORY:  He lives in the area with his wife. He quit tobacco 20  years ago. Denies any alcohol or drugs.   FAMILY HISTORY:  Positive for history of early coronary disease.   PHYSICAL EXAMINATION:  VITAL SIGNS: Temperature 97.6, pulse of 74,  respiratory rate of 24 blood pressure 135/84, O2 saturations 97% on two  liters.  GENERAL: He is alert and oriented x3, in no  acute distress.  HEENT EXAM: Neck is supple without lymphadenopathy. Head normocephalic,  atraumatic. Sclerae are anicteric. His JVP is flat, he has no carotid  bruits. Carotid pulses are 2+ and symmetric bilaterally.  LUNGS: Clear to auscultation bilaterally without any wheezes, rhonchi or  rales.  CARDIOVASCULAR EXAM: Regular rate and rhythm, normal S1, S2 without any  murmurs, rubs, or gallops.  ABDOMEN: Obesity, positive bowel sounds, soft, nontender, nondistended  without any hepatosplenomegaly.  EXTREMITIES: No lower extremity edema, clubbing or cyanosis. There are  2+ radial and posterior tibialis pulses, symmetric bilaterally.  NEUROLOGIC EXAM:  Cranial nerves III-XII are intact, he is moving all  extremities spontaneously. He has 5 out of 5 motor strength throughout.   Chest x-ray shows continued low lung volumes. Large cardiac silhouette,  however this has not changed from chest x-ray in May, 2007.  EKG shows normal sinus rhythm, rate of 68 with a normal axis, normal  intervals without any Q waves or ST, T wave changes suggestive of   ischemia; this is largely unchanged from prior EKGs.   LABORATORY DATA:  Show a hematocrit of 40.1, platelets of 298,000, white  count 8300. Potassium of  3.7, bicarb of 27, creatinine 0.7, glucose of  93. BNP is 40. Two sets of cardiac markers - first set shows a CK-MB of  1.1 with a troponin of less than 0.05; second set shows an MB of 1.3  with a troponin of less than 0.05, a D-dimer is less than 0.22.   ASSESSMENT:  1. Shortness of breath of unclear etiology and does not appear to be      cardiac in nature given there are no EKG finding and he has had two      sets of cardiac markers. He has no evidence of heart failure signs      on exam and this is corroborated with a BNP of 40 in this      gentleman. It is unclear whether this could be related to anxiety      or obstructive sleep apnea. He does have the body habitus for it.  2. Stable coronary artery disease.  3. Hypertension well controlled.  4. Diabetes mellitus.   RECOMMENDATIONS:  Given that his symptoms have not correlated with any  finding of acute pulmonary or cardiac disease, I have had a long  discussion with the patient that at this point in time there is no  indication that he is having a myocardial infarction or that this is  related to any heart failure symptoms. Additionally, pulmonary embolus  has been excluded with a fair amount of certainty. At this point in time  I reassured him this could have just been an aberrational phenomenon  provoked by his method of sleeping. However I did ask him to call Dr.  Quita Skye  office tomorrow and schedule a follow-up appointment sooner this week  than his currently scheduled appointment in April. At this point in time  I have asked him to continue his outpatient medications and to give me a  call if symptoms should recur tonight.           ______________________________  Eston Esters. Sherryll Burger, MD    BRS/MEDQ  D:  12/18/2006  T:  12/18/2006  Job:  [id number]   cc:   Salvadore Farber, MD

## 2011-04-09 NOTE — Assessment & Plan Note (Signed)
[facility]                              CARDIOLOGY OFFICE NOTE   Andrew West, Andrew West                     MRN:          [id number]  DATE:09/21/2006                            DOB:          05/26/1966    PRIMARY CARE PHYSICIAN:  Andrew West, M.D.   HISTORY OF PRESENT ILLNESS:  [name] is a 45 year old gentleman with  coronary disease in the setting of diabetes, hypertension, and dyslipidemia.  He has a long segment chronic occlusion of the dominant right coronary  artery with substantial collateral supply from the left.  Left ventricular  systolic function is normal.  He has had prior stenting of the circumflex  and ramus intermedius.  He says he is having mild discomfort under his left  breast occurring without exertion and without emotional upset.  He says he  can go up and down a flight of stairs West day long at work without any  symptoms.  However, while sitting at home he may have some discomfort under  his left breast.  This is different than his prior angina in its pattern and  location.  It has not progressed over the past several months.  He says he  has been compliant with his medications.   CURRENT MEDICATIONS:  1. Plavix 75 mg per day.  2. Aspirin 325 mg per day.  3. K-Dur 20 mEq per day.  4. Imdur 30 mg per day.  5. Pravastatin 80 mg q.h.s.  6. Actos 45 mg per day.  7. Omeprazole 20 mg per day.  8. Niaspan 1,000 mg per day.  9. Toprol XL 150 mg per day.  10.Glucophage 1,000 mg per day.   PHYSICAL EXAMINATION:  GENERAL:  He is generally well-appearing in no  distress.  VITAL SIGNS:  Heart rate 74, blood pressure 118/72, and weight 270 pounds.  Weight is generally stable.  NECK:  He has no jugular venous distention.  No thyromegaly.  LUNGS:  Clear to auscultation.  CARDIAC:  He has a nondisplaced point of maximal cardiac impulse.  There is  a regular rate and rhythm without murmur, rub, or gallop.  ABDOMEN:  Soft,  nondistended, nontender.  No hepatosplenomegaly.  Bowel  sounds normal.  EXTREMITIES:  Warm without clubbing, cyanosis, edema, or ulceration.  Carotid pulses 2+ bilaterally without bruit.   IMPRESSION/RECOMMENDATION:  1. Chest pain.  Current chest pain does not sound like his angina      pectoris.  Cardiolite last February showed the expected small area of      lateral ischemia.  I do not think his current symptoms warrant a repeat      of this study or change in his medications.  Should they become more      troublesome, particularly at work, would pursue it further.  2. Diabetes mellitus, per Dr. Everardo West.  3. Hypertension.  Blood pressure nicely controlled today.  4. Hypercholesterolemia.  Due for recheck.  We will schedule this.     Andrew Farber, MD  Electronically Signed    WED/MedQ  DD: 09/21/2006  DT: 09/21/2006  Job #:  [id number]   cc:   Andrew Signs A. Everardo All, MD

## 2011-04-09 NOTE — H&P (Signed)
[place]  Patient:    JERREN, FLINCHBAUGH Visit Number: [id number] MRN: [id number]          Service Type: EMS Location: [facility] Attending Physician:  Pearletha Alfred Dictated by:   Lorain Childes, M.D., [facility] Admit Date:  02/10/2002                           History and Physical  PRIMARY CARDIOLOGIST:  Veneda Melter, M.D. [facility]  PRIMARY CARE PHYSICIAN:  No PCP is identified.  CHIEF COMPLAINT:  Chest pain since 7:30 p.m.  HISTORY OF PRESENT ILLNESS:  The patient is a 45 year old gentleman with no coronary disease who is admitted with chest pain.  The patient reports he has done well since his last percutaneous intervention on December 13, 2001 of his left circumflex until today.  Beginning around 7:30-8 p.m. the patient reports the onset of 3/10 chest pain which was left-sided similar to "falling on your knee and scraping it."  He denies any shortness of breath.  No palpitations, no nausea, vomiting, diaphoresis.  Pain lasted a few minutes, then resolved, but recurred several times.  He then had increasing of the pain and rated it as "severe," when yelling at one of his children.  This began around 9:30 p.m. and persisted until he arrived in the emergency room.  He took a Motrin in route with no change in his chest discomfort.  In the emergency room he was given sublingual nitroglycerin.  The patient eventually became chest pain free without any further intervention.  He does report that this pain is different than his prior myocardial infarction pain in that is does not have the pressure component like "someone kicked him in his chest."  PAST MEDICAL HISTORY: 1. Coronary artery disease.  His last catheterization was on December 13, 2001.    His left main was normal.  His LAD had 30% proximal, 20% node, and 20%    distal. Left circumflex is dominant with a mid 95% proximal to the stent    which was okay.  His distal had 30%.  His OM1 is large with  70% and 80%    distal. His left posterolateral had 30%.  RCA is nondominant with 100%    proximal stenosis.  He underwent PCI of his mid left circumflex which had    a 95 which was taken down to 0% with a 2.5 mm stent.  He still had no CAD.    In August of 2002, the patient had an acute myocardial infarction of his    left circumflex.  He had emergent angioplasty with stent placement. 2. Hypertension. 3. Hyperlipidemia. 4. Tobacco abuse.  MEDICATIONS: 1. Aspirin 325 mg p.o. q.d. 2. Hydrochlorothiazide 25 mg p.o. q.d. 3. Toprol XL 50 mg p.o. q.d. 4. Nitroglycerin p.r.n.  ALLERGIES:  The patient has no known drug allergies.  He has no contact allergy.  SOCIAL HISTORY:  He lives in [place] with his wife and kids.  He has seven children.  He is a Merchandiser, retail of housekeeping.  He has a 20 pack-year history of tobacco.  He quit in August of 2002 after his myocardial infarction.  He denies any alcohol, no drugs.  He uses no OTC medication.  He reports that he walks a lot, but no scheduled exercise and he tries to watch his diet.  FAMILY HISTORY:  His mother is okay.  His father had a myocardial infarction at the  age of 68.  His brother had PTCA at the age of 74.  REVIEW OF SYSTEMS:  No fevers, chills, no sweats, no lymphadenopathy, no weight changes.  No headaches, no visual changes.  No skin rashes.  He does report some dyspnea on exertion.  No PND or orthopnea.  No palpitations, no syncope.  No claudication, coughing, or wheezing.  No urinary symptoms.  No nausea, vomiting, diarrhea.  No melena or hematochezia.  No change in his bowel habits.  No polyuria or polydipsia.  No heat or cold intolerance.  PHYSICAL EXAMINATION:  VITAL SIGNS:  Temperature is 97.7, pulse 73, respirations 16, blood pressure 143/86.  He is 97% on room air.  GENERAL:  In general he is a well-nourished male in no acute distress lying comfortably in bed.  HEENT:  Normocephalic, atraumatic.  Pupils round  and reactive to light. Extraocular movements are intact.  Sclerae white, conjunctivae pink. Oropharynx is clear without erythema or exudate.  NECK:  Supple, full range of motion.  No lymphadenopathy.  He has no carotid bruits.  His JVP is approximately 6 cm.  He has 2+ carotid upstroke.  There is no lymphadenopathy.  CARDIOVASCULAR:  He has normal S1, slow S2, regular rate and rhythm, no murmurs, no rubs, no gallops.  His PMI is non-displaced.  EXTREMITIES:  He has 2+ dorsalis pedis, posterior tibialis, and radial pulses which are equal.  He has no bruits present.  LUNGS:  Clear to auscultation bilaterally without any wheezes, rales, or rhonchi.  SKIN:  Skin has no rashes or lesions.  ABDOMEN:  Soft, nontender with normoactive bowel sounds.  Liver is not enlarged.  There is no splenomegaly.  EXTREMITIES:  Have no edema.  He has no cyanosis, no clubbing, no petechiae.  NEUROLOGIC:  He is alert and oriented x3.  His cranial nerves are grossly intact.  His strength is 5/5 in upper extremities and lower extremities bilaterally.  LABORATORY:  Chest x-ray shows no air space disease.  EKG shows sinus rhythm, rate of 75.  Normal axis, normal intervals.  He has an RSR prime in V1.  He has T-wave inversions in III and aVF.  No Q-waves are present.  There is no change from a previous EKG dated December 11, 2001.  Laboratory data shows a hemoglobin of 12.9, hematocrit 39.5, white count 9.5, platelet count of 274.  Sodium 143, potassium 3.5, chloride 105, bicarb 27, BUN 13, creatinine 0.8, and glucose of 140.  Total bilirubin 0.4, AST 31, ALT 41, alkaline phosphatase 53.  Total protein 7.2, albumin 3.5.  Urine drug screen negative.  Troponin 0.02.  PTT 35, PT 1346, INR 1.1.  CK-MB pending.  ASSESSMENT/PLAN:  The patient is a 45 year old gentleman with known coronary disease.  He is admitted with chest pain.  1. Coronary disease.  The patient has unstable angina.  This pain is somewhat     different than his prior chest pain; however, very concerning in this     patient.  We will admit him on telemetry, check his cardiac enzymes, and    follow up EKG.  Will continue him on aspirin and beta blocker.  Will    start Nitropaste and heparin.  Will not use any _________ because his    weight is greater than 100 kg.  I discussed cardiac catheterization with    the patient.  He is reluctant at this time.  He would prefer noninvasive    testing.  However, he will continue to think about  cardiac catheterization.    We will monitor him and determine his final imaging or stress test over    the next couple of days. 2. Hypertension.  His blood pressure will be controlled with the above    medications.  We are going to hold his diuretic for now, if the patient    is going for cardiac catheterization.  His volume status currently appears    good.  His blood pressure is improved with starting Nitropaste. 3. Hyperlipidemia.  Continue on Simvastatin.  We will recheck a fasting lipid    panel. 4. Tobacco abuse.  The patient quit.  Will encourage him to continue with    cessation. Dictated by:   Lorain Childes, M.D., [facility] Attending Physician:  Susy [place] B DD:  02/11/02 TD:  02/11/02 Job: [id number]

## 2011-04-09 NOTE — Discharge Summary (Signed)
[place]  Patient:    Andrew West, Andrew West Visit Number: [id number] MRN: [id number]          Service Type: MED Location: [phone] Attending Physician:  Colon Branch Dictated by:   Brita Romp, P.A.-C Admit Date:  12/11/2001 Discharge Date: 12/14/2001                             Discharge Summary  DISCHARGE DIAGNOSES: 1. Chest pain, status post cardiac catheterization with percutaneous coronary    intervention. 2. History of myocardial infarction in November 2002. 3. Hypertension. 4. Hyperlipidemia. 5. Tobacco abuse. 6. Obesity.  HOSPITAL COURSE:  Mr. Dotson was a 45 year old African-American male who had a myocardial infarction in November 2002.  He had done well since discharge until the day of admission.  At approximately 3:30 he had the onset of substernal chest pain which was sharp in nature, rated a 1 to 2/10, not accompanied by shortness of breath, nausea, vomiting, or diaphoresis.  He stated the pain was similar to his myocardial infarction symptoms, and was helped somewhat by sublingual nitroglycerin.  He was seen and admitted by Dr. Charlton Haws.  Given the patients history, Dr. Eden Emms felt that the patient should undergo cardiac catheterization.  The patient was started on heparin and nitroglycerin drips.  On December 13, 2001, the patient was taken to the catheterization laboratory by Dr. Loraine Leriche Pulsipher.  CATHETERIZATION RESULTS: 1. Left ventriculogram:  Ejection fraction of approximately 63%.  There is no    wall motion abnormalities, no mitral regurgitation. 2. Left anterior descending artery:  30% proximal vessel lesion, followed by a    20% lesion in the mid vessel, as well as 20% in the distal vessel. 3. Left circumflex:  Large dominant.  There is a tubular 95% stenosis in the    mid vessel which was just proximal to the previous stent.  The stent itself    is widely patent.  Distally in the circumflex, there  was diffuse 30%    stenosis in the area of the posterolateral branches. 4. Right coronary artery:  Non-dominant; totally occluded proximally.  There    are noted to be left-to-right collaterals filling the distal right coronary    artery.  Dr. Gerri Spore then proceeded to perform angioplasty and stenting on the lesion in the mid circumflex.  The lesion was reduced down from a 95% to a 0% residual with resulting TIMI III flow.  The next day, the patient was seen by Dr. Chales Abrahams.  He felt the patient was stable and doing well, and referred him for discharge.  DISCHARGE MEDICATIONS: 1. Aspirin 325 mg q.d. 2. Zestril 2 mg q.d. 3. Zocor 20 mg q.h.s. 4. Hydrochlorothiazide 25 mg q.d. 5. Toprol XL 50 mg q.d. 6. Plavix 75 mg p.o. q.d. until gone. 7. Nitroglycerin p.r.n.  LABORATORY DATA:  White count 8.7, hemoglobin 14.2, hematocrit 41.8, platelets 302.  Sodium 142, potassium 3.5, chloride 104, CO2 30, BUN 11, creatinine 0.8, glucose 157.  Total protein 7.3, albumin 3.3, AST 26, ALT 38, ALP 58.  Total bilirubin 0.4, direct bilirubin 0.1, indirect bilirubin 0.3.  Homocystine level of 5.91.  Glycosylated hemoglobin A1C 6.4.  Total cholesterol 129, triglycerides 266, HDL 28, LDL 48, total cholesterol HDL ratio of 4.6.  Serial cardiac enzymes were negative for myocardial infarction.  Chest x-ray on admission showed cardiomegaly, but no active disease.  Electrocardiogram showed normal sinus rhythm at 79.  PR interval 158, QRS 106, QTC 438, axis 39.  There are noted to be some non-specific T-wave changes.  ACTIVITY:  The patient is to avoid driving, heavy lifting, or tub baths for 48 hours.  RETURN TO WORK:  On December 18, 2001.  DIET:  Low fat.  WOUND CARE:  He is to watch the catheter site for any pain, or any obvious problems.  FOLLOWUP:  Dr. Lyna Poser. in the office in approximately two weeks.  He is to contact the office for an appointment. Dictated by:   Brita Romp,  P.A.-C Attending Physician:  Colon Branch DD:  12/14/01 TD:  12/15/01 Job: [id number]

## 2011-06-21 ENCOUNTER — Other Ambulatory Visit: Payer: Self-pay | Admitting: Internal Medicine

## 2011-07-22 ENCOUNTER — Other Ambulatory Visit: Payer: Self-pay | Admitting: Internal Medicine

## 2011-07-27 ENCOUNTER — Observation Stay (HOSPITAL_COMMUNITY)
Admission: EM | Admit: 2011-07-27 | Discharge: 2011-07-30 | DRG: 097 | Disposition: A | Discharge status: Home / Self Care | Payer: BC Managed Care – PPO | Source: Ambulatory Visit | Attending: Internal Medicine | Admitting: Internal Medicine

## 2011-07-27 ENCOUNTER — Emergency Department (HOSPITAL_COMMUNITY): Payer: BC Managed Care – PPO

## 2011-07-27 DIAGNOSIS — R0602 Shortness of breath: Secondary | ICD-10-CM | POA: Insufficient documentation

## 2011-07-27 DIAGNOSIS — E119 Type 2 diabetes mellitus without complications: Secondary | ICD-10-CM | POA: Insufficient documentation

## 2011-07-27 DIAGNOSIS — R002 Palpitations: Secondary | ICD-10-CM | POA: Insufficient documentation

## 2011-07-27 DIAGNOSIS — Z9861 Coronary angioplasty status: Secondary | ICD-10-CM | POA: Insufficient documentation

## 2011-07-27 DIAGNOSIS — Z91199 Patient's noncompliance with other medical treatment and regimen due to unspecified reason: Secondary | ICD-10-CM | POA: Insufficient documentation

## 2011-07-27 DIAGNOSIS — E785 Hyperlipidemia, unspecified: Secondary | ICD-10-CM | POA: Insufficient documentation

## 2011-07-27 DIAGNOSIS — R0789 Other chest pain: Principal | ICD-10-CM | POA: Insufficient documentation

## 2011-07-27 DIAGNOSIS — R61 Generalized hyperhidrosis: Secondary | ICD-10-CM | POA: Insufficient documentation

## 2011-07-27 DIAGNOSIS — Z9119 Patient's noncompliance with other medical treatment and regimen: Secondary | ICD-10-CM | POA: Insufficient documentation

## 2011-07-27 DIAGNOSIS — E7800 Pure hypercholesterolemia, unspecified: Secondary | ICD-10-CM | POA: Insufficient documentation

## 2011-07-27 DIAGNOSIS — I10 Essential (primary) hypertension: Secondary | ICD-10-CM | POA: Insufficient documentation

## 2011-07-27 DIAGNOSIS — I252 Old myocardial infarction: Secondary | ICD-10-CM | POA: Insufficient documentation

## 2011-07-27 DIAGNOSIS — I2510 Atherosclerotic heart disease of native coronary artery without angina pectoris: Secondary | ICD-10-CM | POA: Insufficient documentation

## 2011-07-27 LAB — DIFFERENTIAL
Basophils Absolute: 0 10*3/uL (ref 0.0–0.1)
Basophils Relative: 0 % (ref 0–1)
Eosinophils Absolute: 0.2 10*3/uL (ref 0.0–0.7)
Monocytes Absolute: 0.6 10*3/uL (ref 0.1–1.0)
Monocytes Relative: 8 % (ref 3–12)
Neutrophils Relative %: 41 % — ABNORMAL LOW (ref 43–77)

## 2011-07-27 LAB — POCT I-STAT TROPONIN I
Troponin i, poc: 0 ng/mL (ref 0.00–0.08)
Troponin i, poc: 0 ng/mL (ref 0.00–0.08)

## 2011-07-27 LAB — CBC
MCH: 26.8 pg (ref 26.0–34.0)
MCHC: 34.3 g/dL (ref 30.0–36.0)
Platelets: 239 10*3/uL (ref 150–400)

## 2011-07-27 LAB — COMPREHENSIVE METABOLIC PANEL
Albumin: 3.9 g/dL (ref 3.5–5.2)
BUN: 11 mg/dL (ref 6–23)
Calcium: 9.1 mg/dL (ref 8.4–10.5)
Creatinine, Ser: 0.79 mg/dL (ref 0.50–1.35)
Total Protein: 7.6 g/dL (ref 6.0–8.3)

## 2011-07-27 LAB — GLUCOSE, CAPILLARY: Glucose-Capillary: 125 mg/dL — ABNORMAL HIGH (ref 70–99)

## 2011-07-27 LAB — D-DIMER, QUANTITATIVE: D-Dimer, Quant: 0.39 ug/mL-FEU (ref 0.00–0.48)

## 2011-07-27 LAB — CK TOTAL AND CKMB (NOT AT ARMC)
CK, MB: 3.7 ng/mL (ref 0.3–4.0)
Relative Index: 0.8 (ref 0.0–2.5)
Total CK: 465 U/L — ABNORMAL HIGH (ref 7–232)

## 2011-07-27 LAB — CARDIAC PANEL(CRET KIN+CKTOT+MB+TROPI): Relative Index: 0.8 (ref 0.0–2.5)

## 2011-07-28 HISTORY — PX: TRANSTHORACIC ECHOCARDIOGRAM: SHX275

## 2011-07-28 LAB — HEMOGLOBIN A1C
Hgb A1c MFr Bld: 6 % — ABNORMAL HIGH (ref <5.7)
Mean Plasma Glucose: 126 mg/dL — ABNORMAL HIGH (ref <117)

## 2011-07-28 LAB — GLUCOSE, CAPILLARY
Glucose-Capillary: 109 mg/dL — ABNORMAL HIGH (ref 70–99)
Glucose-Capillary: 144 mg/dL — ABNORMAL HIGH (ref 70–99)

## 2011-07-28 LAB — CARDIAC PANEL(CRET KIN+CKTOT+MB+TROPI): Total CK: 324 U/L — ABNORMAL HIGH (ref 7–232)

## 2011-07-29 ENCOUNTER — Inpatient Hospital Stay (HOSPITAL_COMMUNITY): Payer: BC Managed Care – PPO

## 2011-07-29 MED ORDER — TECHNETIUM TC 99M TETROFOSMIN IV KIT
10.0000 | PACK | Freq: Once | INTRAVENOUS | Status: AC | PRN
  Administered 2011-07-29: 10 via INTRAVENOUS

## 2011-07-29 MED ORDER — TECHNETIUM TC 99M TETROFOSMIN IV KIT
30.0000 | PACK | Freq: Once | INTRAVENOUS | Status: AC | PRN
  Administered 2011-07-29: 30 via INTRAVENOUS

## 2011-07-30 LAB — BASIC METABOLIC PANEL
BUN: 14 mg/dL (ref 6–23)
CO2: 30 mEq/L (ref 19–32)
Calcium: 9 mg/dL (ref 8.4–10.5)
Creatinine, Ser: 0.76 mg/dL (ref 0.50–1.35)
Glucose, Bld: 98 mg/dL (ref 70–99)

## 2011-07-30 LAB — PROTIME-INR: INR: 0.96 (ref 0.00–1.49)

## 2011-07-30 LAB — CBC
HCT: 40.9 % (ref 39.0–52.0)
Hemoglobin: 13.5 g/dL (ref 13.0–17.0)
MCH: 26 pg (ref 26.0–34.0)
MCV: 78.8 fL (ref 78.0–100.0)
RBC: 5.19 MIL/uL (ref 4.22–5.81)

## 2011-07-30 LAB — GLUCOSE, CAPILLARY: Glucose-Capillary: 152 mg/dL — ABNORMAL HIGH (ref 70–99)

## 2011-08-08 NOTE — Discharge Summary (Signed)
NAMEMarland Kitchen  DENT, KELNER NO.:  [id number]  MEDICAL RECORD NO.:  [id number]  LOCATION:  2924                         FACILITY:  [facility]  PHYSICIAN:  Jeoffrey Massed, MD    DATE OF BIRTH:  November 03, 1966  DATE OF ADMISSION:  07/27/2011 DATE OF DISCHARGE:                              DISCHARGE SUMMARY   DATE OF DISCHARGE:  Pending.  CONSULTANTS:  This admission [facility], Dr. Nanetta Batty.  CHIEF COMPLAINT/REASON FOR ADMISSION:  Mr. Talati is a 45 year old man with multiple cardiac risk factors, who also has a known history of CAD and prior coronary stent placement.  He has recently been dismissed from [facility] due to noncompliance with his plan of care.  The patient developed chest pain on driving back from [facility].  He described the chest pain as being constant in a retrosternal location radiating to the left arm causing numbness.  This chest pain was associated with shortness of breath and palpitations and diaphoresis. He reports this is a pain similar to when he had his previous myocardial infarction.  The patient upon presentation to the ER has had continuous chest pain for multiple hours.  He presented to the ER at 2 p.m. and he was evaluated by the hospitalist service at 6 p.m. despite multiple nitroglycerin doses given initially sublingual route in light of the patch as well as received morphine, he was still complaining of retrosternal chest and shortness of breath.  His initial EKG was nonischemic in nature.  No elevated or decreased ST segments.  Cardiac isoenzymes have been negative.  PHYSICAL EXAMINATION:  VITAL SIGNS:  On initial exam, vital signs were as follows.  Temperature 98.8, BP 104/65, pulse 80, respirations 16, O2 saturations 95% on room air. GENERAL APPEARANCE:  The patient was of a healthy male, in no acute distress.  Physical exam was otherwise unremarkable. CHEST:  Chest wall was nontender to  palpation.  He was noted to have some crackles or rales in the right base.  ADMITTING LABORATORY DATA:  Cardiac isoenzymes have been negative except for mildly elevated CPK at 465.  His white count was dictated as being normal and his comprehensive metabolic panel was normal except for potassium of 3.5.  ADMITTING DIAGNOSTICS:  EKG demonstrated normal sinus rhythm without ST or T-wave abnormalities.  Chest x-ray demonstrated airway thickening that may reflect bronchitis or reactive airway disease.  No airspace opacities identified to suggest a bacterial pneumonia pattern.  PAST MEDICAL HISTORY: 1. Known coronary artery disease post PCI to the circumflex and OM1.     Last cardiac catheterization in 2004 showed large LAD with     irregularities in the circumflex was midsize with 70% in-stent     restenosis.  At that time, the RCA was dominant at 100% and filled     via collaterals.  The OM1 had 70% and 100% in-stent restenosis.     His ejection fraction was 55%. 2. Hypertension. 3. Diabetes mellitus, on metformin. 4. Dyslipidemia. 5. Not compliant. 6. Rhabdomyolysis secondary to statins. 7. Possible conversion disorder during July 2011 admission.  At that     time, it was suspected he had a TIA, but there  were certain     clinical signs, which have not been documented here that raised the     provider suspicion that the patient has possible conversion     disorder.  ADMITTING DIAGNOSES: 1. Persistent chest pain in man with established coronary artery     disease, currently EKG and enzymes negative. 2. Diabetes II, on metformin without hyperglycemia. 3. Hypertension, currently controlled. 4. Dyslipidemia on Zetia.  DIAGNOSTICS: 1. Since admission, Nuclear Medicine Myoview scan is pending at time     of dictation. 2. Laboratory since admission, cardiac panels have been cycled x3 this     admission and are negative except for mildly elevated CPK, which is     trending downward.   Last CPK on July 28, 2011, was 324.     Hemoglobin A1c is 6.0.  MRSA/PCR screening is negative.  D-dimer     normal at 0.39.  HOSPITAL COURSE: 1. Known coronary artery disease and prior coronary stents, persistent     chest pain, atypical pattern.  Because of the patient's history of     multiple cardiac risk factors, he was admitted to the [facility] ICU.     On July 28, 2011, he was noted to have continuous chest pain,     although it was less than at presentation.  He did not have any EKG     changes and he had no enzymatic changes.  Cardiology was consulted.     The patient was offered the choice between the Myoview scan and     cardiac catheterization given his history.  Because he had what he     calls is a bad experience with a last cardiac catheterization, he     opted to proceed with Myoview scan initially.  At time of     dictation, Myoview scan is in process.  If Myoview scan shows no     evidence of ischemia, the patient will likely be discharged home on     preadmission medications.  If Myoview scan is positive, then plan     of care will be driven back to cardiologist recommendations.     Please note that the patient's chest x-ray was concerning for     possible bronchitis, but without any evidence of bacterial     pneumonia.  I suspect that the patient's chest pain could be     related to viral bronchitis.  He has not had any fevers or     leukocytosis, so antibiotics are not wanted at this time and     supportive care with over-the-counter medications would be     indicated. 2. Hypertension.  The patient presented with mildly uncontrolled     hypertension, which is now controlled.  He will continue his usual     ACE inhibitor, hydrochlorothiazide, and beta-blocker therapy as     prior to admission. 3. Diabetes 2, metformin was placed on hold due to concerns that the     patient may need to undergo cardiac catheterization.  His sugars     have remained well  controlled on sliding scale insulin,     consistently less than 130.  Hemoglobin A1c was 6, which     demonstrates well controlled diabetes prior to admission.  If the     patient undergoes cardiac catheterization this admission, will need     to have metformin held for 48 hours postprocedure. 4. Dyslipidemia.  Zetia was initially discontinued by the cardiology  physician extender due to the patient's history of myalgias to     statins and elevated CPK this admission.  The cardiologist has     resumed this medication.  FINAL DISCHARGE DIAGNOSES: 1. Persistent chest pain in patient with known coronary artery disease     and coronary stents, most likely atypical in nature pending Myoview     results. 2. Possible acute viral bronchitis causing chest pain. 3. Hypertension, currently controlled. 4. Diabetes 2, controlled. 5. Dyslipidemia, controlled. 6. History of gastroesophageal reflux disease, on proton pump     inhibitor prior to admission.  DISCHARGE MEDICATIONS: 1. Aspirin 325 mg daily. 2. Zetia 10 mg daily at bedtime. 3. Imdur 30 mg b.i.d. 4. Klor-Con 20 mEq daily. 5. Metformin 1000 mg 2 tablets b.i.d. 6. Metoprolol 50 mg b.i.d. 7. Nexium 40 mg daily. 8. Niaspan 1000 mg daily. 9. Plavix 75 mg daily. 10.Quinapril/hydrochlorothiazide 20/12.5 mg 2 tablets by mouth twice     daily.  ACTIVITY:  Increase activity slowly.  No work restrictions.  Please see work note provided to the patient.  DIET:  Low sodium heart setting with carb modified as prior to admission.  WOUND CARE:  Not applicable unless the patient undergoes cardiac catheterization.  Addendum will be dictated.  FOLLOWUP APPOINTMENTS: 1. The patient is to call his primary care physician, Dr. Loleta Chance, to be     seen 1-2 weeks for routine hospital follow up, sooner if chest pain     persists and workup reveals a noncardiac etiology. 2. If the patient does have abnormal Myoview scan and discharge is     delayed to  proceed with cardiac catheterization, follow up with     cardiologist per their recommendations after discharge.  Please note that there may be an addendum to this dictation pending results of Myoview scan.  In addition, there may be an addendum to the medication list pending Myoview scan results.     Allison L. Rennis Harding, N.P.   ______________________________ Jeoffrey Massed, MD    ALE/MEDQ  D:  07/29/2011  T:  07/29/2011  Job:  [id number]  cc:   Annia Friendly. Loleta Chance, MD Nanetta Batty, M.D.  Electronically Signed by Junious Silk N.P. on 08/03/2011 12:37:15 PM Electronically Signed by Jeoffrey Massed  on 08/08/2011 12:17:12 PM

## 2011-08-13 LAB — DIFFERENTIAL
Eosinophils Relative: 3
Lymphocytes Relative: 36
Lymphs Abs: 3.4

## 2011-08-13 LAB — CBC
HCT: 37.6 — ABNORMAL LOW
Hemoglobin: 12.4 — ABNORMAL LOW
Platelets: 315
RBC: 4.79
WBC: 9.5

## 2011-08-13 LAB — POCT I-STAT CREATININE
Creatinine, Ser: 0.8
Operator id: 151321

## 2011-08-13 LAB — I-STAT 8, (EC8 V) (CONVERTED LAB)
BUN: 8
Bicarbonate: 27.7 — ABNORMAL HIGH
HCT: 41
Operator id: 151321
Sodium: 140
TCO2: 29
pCO2, Ven: 42.2 — ABNORMAL LOW

## 2011-08-13 LAB — POCT CARDIAC MARKERS
CKMB, poc: 1.4
Myoglobin, poc: 73.5
Troponin i, poc: <0.05

## 2011-08-16 NOTE — H&P (Signed)
NAMEMarland West  ELISEY, HALBERG NO.:  [id number]  MEDICAL RECORD NO.:  [id number]  LOCATION:  2924                         FACILITY:  [facility]  PHYSICIAN:  Jonny Ruiz, MD    DATE OF BIRTH:  04-Jan-1966  DATE OF ADMISSION:  07/27/2011 DATE OF DISCHARGE:                             HISTORY & PHYSICAL   CHIEF COMPLAINT:  Chest pain.  HISTORY OF PRESENT ILLNESS:  A 45 year old man with multiple admissions to our hospital for chest pains with a past medical history significant for coronary artery disease, recently dismissed from [facility] for noncompliance with care, who was at the [facility] this weekend and yesterday developed retrosternal chest pain associated with shortness of breath, palpitations, and diaphoresis continuously.  The pain is radiating to the left shoulder and similar to the pain that he had with previous MI.  The patient has been in the ER since 2 p.m., and I saw him around 6 p.m.  Despite multiple nitroglycerin, sublingual nitroglycerin patch, and morphine, he was still complaining of retrosternal chest pain and shortness of breath.  His initial cardiac workup including EKG and cardiac enzymes have been negative so far.  The patient is being admitted for further management.  PAST MEDICAL HISTORY: 1. Coronary artery disease, status post PCI to the circumflex and OM-     1.  Last catheterization in 2004 showed a large LAD with     irregularities.  Circumflex was midsized to 70% in-stent     restenosis.  RCA was dominant at 100% and fills via collaterals.     OM-1 had a 70% and 100% in-stent restenosis.  The LVEF of 55%. 2. Hypertension. 3. Diabetes mellitus. 4. Hyperlipidemia. 5. Noncompliance. 6. Rhabdomyolysis, probably secondary to statin. 7. Possible conversion disorder, during admission in July 2011, for a     suspected TIA, but with red flag raised suspicion not for     conversion disorder.  SURGICAL HISTORY: 1. February 12, 2002,  cardiac catheterization. 2. March 05, 2002, repeat cardiac catheterization, EF 80%, no     revascularization. 3. October 10, 2003, cardiac catheterization, successful PTCA of the     mid AV circumflex with reduction of 70% in-stent restenosis of less     than 10%. 4. August 13, 2004, excision of anal plaque, anal polyps, and     sphincterectomy.  CURRENT MEDICATIONS: 1. Nexium 40 mg a day. 2. Quinapril/hydrochlorothiazide 20/12.5 two tablets twice a day. 3. Metoprolol 50 mg b.i.d. 4. Niaspan (niacin SR 1000 mg daily). 5. Plavix 75 mg a day. 6. Metformin 1000 mg b.i.d. 7. Klor-Con 20 mEq 1 tablet daily. 8. Imdur (isosorbide mononitrate XR 30 mg p.o. b.i.d.). 9. Zetia 10 mg at bedtime. 10.Aspirin 325 mg a day.  ALLERGIES: 1. No known drug allergies although STATINS cause rhabdomyolysis on 1     admission. 2. CONTRAST MEDIA causes nausea, vomiting, and rash.  No known food     allergies.  SOCIAL HISTORY:  The patient has a history on e-chart of smoking, however, he told me that he quit smoking 8 years ago.  The patient denies alcohol or illicits.  He is married.  He works as a Copy.  FAMILY  HISTORY:  Positive for hypertension, diabetes, heart disease, as well as cancer.  REVIEW OF SYSTEMS:  CONSTITUTIONAL:  Denies fever, chills, night sweats, fatigue, malaise, or weight loss.  CARDIOVASCULAR:  See HPI. RESPIRATORY:  Denies wheezes or cough.  GASTROINTESTINAL:  Denies nausea, vomiting, diarrhea, constipation, hematochezia, or melena.  GU: Denies dysuria, frequency, or hematuria.  MUSCULOSKELETAL:  Denies arthralgia, myalgias, or joint swelling.  PHYSICAL EXAMINATION:  VITAL SIGNS:  Blood pressure 104/65, pulse 80, respirations 16, temperature 98.8, O2 sat 95% room air. GENERAL APPEARANCE:  The patient is a well-built African American man who appears in no acute distress. HEENT: Unremarkable. NECK:  Supple.  No lymphadenopathy.  No JVD. HEART:  Chest symmetric.  PMI  not displaced.  Regular S1 and S2 without gallops, murmurs, or rubs.  No tenderness on palpation. LUNGS:  Rales in the right base. ABDOMEN:  Nondistended with normal bowel sounds, soft, nontender without organomegaly or masses palpable. EXTREMITIES:  No clubbing, cyanosis, or edema. NEUROLOGIC:  Cranial nerves II-XII grossly intact.  Motor strength 5/5 in the upper and lower extremities.  Sensory, no deficits.  Babinski negative.  LABORATORY DATA:  Troponin 0.  CK total 465, MB 3.7, relative index 0.8. Comprehensive metabolic panel normal except for potassium 3.5.  CBC normal.  EKG, normal sinus rhythm without ST or T-wave abnormalities. Chest x-ray, airway thickening, may reflect bronchitis or reactive airway disease.  No airspace opacities identified to suggest a bacterial pneumonia pattern.  ASSESSMENT AND PLAN:  A 45 year old man with established coronary artery disease, hypertension, and diabetes as well as hypercholesterolemia, presenting with 1 day of continuous chest pain in an anginal-type pattern with initial cardiac evaluation negative.  Dr. Lynelle Doctor, ED physician, contacted [facility] and was informed that the patient was dismissed from practice.  Subsequently, he contacted Dr. Allyson Sabal who asked for our team to admit the patient and for the abnormal workup to console them and cath in the morning.  The patient will be placed on nitrates, aspirin, and morphine, and reassess.  Re-cycle cardiac enzymes.  Repeat EKG in the morning. Continue on hypertensive agents.  Insulin sliding scale per protocol.          ______________________________ Jonny Ruiz, MD     GL/MEDQ  D:  07/27/2011  T:  07/27/2011  Job:  [id number]  Electronically Signed by Jonny Ruiz MD on 08/16/2011 04:09:02 PM

## 2011-08-18 ENCOUNTER — Other Ambulatory Visit: Payer: Self-pay | Admitting: Internal Medicine

## 2011-08-19 LAB — DIFFERENTIAL
Basophils Absolute: 0.1
Basophils Relative: 1
Eosinophils Absolute: 0.1
Eosinophils Relative: 2
Lymphocytes Relative: 24
Lymphs Abs: 1.9
Monocytes Absolute: 0.8
Monocytes Relative: 10
Neutro Abs: 5.3
Neutrophils Relative %: 64

## 2011-08-19 LAB — CBC
HCT: 40.8
Hemoglobin: 13.8
MCHC: 33.8
MCV: 79.4
Platelets: 260
RBC: 5.14
RDW: 14.5
WBC: 8.3

## 2011-08-19 LAB — BASIC METABOLIC PANEL
BUN: 10
CO2: 25
Calcium: 8.9
Chloride: 107
Creatinine, Ser: 0.72
GFR calc Af Amer: >60
GFR calc non Af Amer: >60
Glucose, Bld: 96
Potassium: 3.6
Sodium: 141

## 2011-08-19 LAB — URINALYSIS, ROUTINE W REFLEX MICROSCOPIC
Bilirubin Urine: NEGATIVE
Glucose, UA: NEGATIVE
Hgb urine dipstick: NEGATIVE
Ketones, ur: NEGATIVE
Nitrite: NEGATIVE
Protein, ur: NEGATIVE
Specific Gravity, Urine: 1.015
Urobilinogen, UA: 1
pH: 6

## 2011-08-19 LAB — URINE MICROSCOPIC-ADD ON

## 2011-08-19 NOTE — Discharge Summary (Signed)
NAMEMarland Kitchen  CEAN, PHINNEY NO.:  [id number]  MEDICAL RECORD NO.:  [id number]  LOCATION:  2924                         FACILITY:  [facility]  PHYSICIAN:  Lonia Blood, M.D.DATE OF BIRTH:  February 10, 1966  DATE OF ADMISSION:  07/27/2011 DATE OF DISCHARGE:  07/30/2011                              DISCHARGE SUMMARY   ADDENDUM  Since the discharge summary was initially dictated on September 6, the patient's Myoview study was completed and was determined to be mildly positive.  Cardiology recommended cardiac catheterization and the patient did stay overnight in anticipation for undergoing cardiac catheterization.  After being examined on the morning of July 30, 2011 by Cardiology, the patient told the Cardiologist he did not wish to have a catheterization at this time.  The cardiologist documents that he feels that the patient is low risk based on the Myoview scan, but the scan is still abnormal, therefore, they are optimizing medical therapy.  His long-acting nitrates has been adjusted upward.  The patient has been instructed by the cardiologist as well by the hospitalist if the chest pain returns, he will definitely need a cardiac catheterization without noninvasive study prior to cath procedure.  The patient subsequently admitted to Dr. Sharon Seller that he had not been taking his Plavix as prescribed.  This was basically due to financial issues. The cardiologist have recommended that the patient be discharged home today.  Suspect the patient may also have a viral bronchitic component to his chest discomfort.  He admits to having nonproductive cough without fevers prior to admission.  NEW DISCHARGE MEDICATIONS: 1. The patient's Imdur has been increased to a total of 45 mg b.i.d.     He will continue his 30 mg tablets and he has been given a new     prescription of 15 mg add to that to take twice a day to equal 45     mg daily. 2. He has also began a 3-day prescription  of Plavix 75 mg daily for     the case manager to have filled until the patient is able to obtain     a full Plavix prescription.  A request has been placed with the     social worker and case manager to see if they can help the patient     get into a Plavix program to help him better afford his     medications.  OTHER DISCHARGE INSTRUCTIONS: 1. Regarding follow-up appointment, the patient does have an     appointment with [facility], telephone number     [phone] on September 17 at 4 p.m. 2. He is to call his primary care physician Dr. Mirna Mires to be seen     in 1-2 weeks.  ACTIVITY:  The patient has been given a work note documenting the time of stay for hospitalization through September 7.  DIET:  He is to continue a sodium heart-healthy carb modified diet.  OTHER INSTRUCTIONS:  He has been notified to call the cardiologist if he continues to have problems with ongoing chest pain as previously delineated.     Allison L. Rennis Harding, N.P.   ______________________________ Lonia Blood, M.D.  ALE/MEDQ  D:  07/30/2011  T:  07/30/2011  Job:  [id number]  cc:   Nanetta Batty, M.D. Annia Friendly. Loleta Chance, MD  Electronically Signed by Junious Silk N.P. on 08/03/2011 12:38:23 PM Electronically Signed by Jetty Duhamel M.D. on 08/19/2011 09:48:12 AM

## 2011-08-23 LAB — POCT CARDIAC MARKERS
CKMB, poc: 1.2
CKMB, poc: <1 — ABNORMAL LOW
Myoglobin, poc: 70.1
Myoglobin, poc: 75.2

## 2011-08-23 LAB — POCT I-STAT, CHEM 8
BUN: 8
Chloride: 105
Potassium: 3.6
Sodium: 143
TCO2: 28

## 2011-08-30 ENCOUNTER — Inpatient Hospital Stay (HOSPITAL_COMMUNITY)
Admission: EM | Admit: 2011-08-30 | Discharge: 2011-09-02 | DRG: 852 | Disposition: A | Discharge status: Home / Self Care | Payer: BC Managed Care – PPO | Source: Ambulatory Visit | Attending: Cardiology | Admitting: Cardiology

## 2011-08-30 ENCOUNTER — Emergency Department (HOSPITAL_COMMUNITY): Payer: BC Managed Care – PPO

## 2011-08-30 DIAGNOSIS — T82897A Other specified complication of cardiac prosthetic devices, implants and grafts, initial encounter: Principal | ICD-10-CM | POA: Diagnosis present

## 2011-08-30 DIAGNOSIS — I10 Essential (primary) hypertension: Secondary | ICD-10-CM | POA: Diagnosis present

## 2011-08-30 DIAGNOSIS — Y840 Cardiac catheterization as the cause of abnormal reaction of the patient, or of later complication, without mention of misadventure at the time of the procedure: Secondary | ICD-10-CM | POA: Diagnosis present

## 2011-08-30 DIAGNOSIS — I200 Unstable angina: Secondary | ICD-10-CM | POA: Diagnosis present

## 2011-08-30 DIAGNOSIS — I2510 Atherosclerotic heart disease of native coronary artery without angina pectoris: Secondary | ICD-10-CM | POA: Diagnosis present

## 2011-08-30 DIAGNOSIS — E119 Type 2 diabetes mellitus without complications: Secondary | ICD-10-CM | POA: Diagnosis present

## 2011-08-30 DIAGNOSIS — Z9861 Coronary angioplasty status: Secondary | ICD-10-CM

## 2011-08-30 DIAGNOSIS — Z9119 Patient's noncompliance with other medical treatment and regimen: Secondary | ICD-10-CM

## 2011-08-30 DIAGNOSIS — E785 Hyperlipidemia, unspecified: Secondary | ICD-10-CM | POA: Diagnosis present

## 2011-08-30 DIAGNOSIS — Z91199 Patient's noncompliance with other medical treatment and regimen due to unspecified reason: Secondary | ICD-10-CM

## 2011-08-30 DIAGNOSIS — Z87891 Personal history of nicotine dependence: Secondary | ICD-10-CM

## 2011-08-30 LAB — DIFFERENTIAL
Basophils Relative: 2 % — ABNORMAL HIGH (ref 0–1)
Eosinophils Absolute: 0.1 10*3/uL (ref 0.0–0.7)
Lymphocytes Relative: 46 % (ref 12–46)
Neutrophils Relative %: 42 % — ABNORMAL LOW (ref 43–77)

## 2011-08-30 LAB — CBC
HCT: 43.6 % (ref 39.0–52.0)
Platelets: 240 10*3/uL (ref 150–400)
RDW: 13.7 % (ref 11.5–15.5)
WBC: 7.4 10*3/uL (ref 4.0–10.5)

## 2011-08-30 LAB — CARDIAC PANEL(CRET KIN+CKTOT+MB+TROPI)
Relative Index: 1 (ref 0.0–2.5)
Total CK: 230 U/L (ref 7–232)
Troponin I: <0.3 ng/mL (ref <0.30)
Troponin I: <0.3 ng/mL (ref <0.30)

## 2011-08-30 LAB — CK TOTAL AND CKMB (NOT AT ARMC)
CK, MB: 2.1 ng/mL (ref 0.3–4.0)
Relative Index: 0.7 (ref 0.0–2.5)

## 2011-08-30 LAB — COMPREHENSIVE METABOLIC PANEL
ALT: 22 U/L (ref 0–53)
AST: 16 U/L (ref 0–37)
Albumin: 3.6 g/dL (ref 3.5–5.2)
CO2: 27 mEq/L (ref 19–32)
Calcium: 9.1 mg/dL (ref 8.4–10.5)
Sodium: 141 mEq/L (ref 135–145)
Total Protein: 7.4 g/dL (ref 6.0–8.3)

## 2011-08-30 LAB — URINALYSIS, ROUTINE W REFLEX MICROSCOPIC
Leukocytes, UA: NEGATIVE
Protein, ur: NEGATIVE mg/dL
Urobilinogen, UA: 0.2 mg/dL (ref 0.0–1.0)

## 2011-08-30 LAB — TROPONIN I: Troponin I: <0.3 ng/mL (ref <0.30)

## 2011-08-30 LAB — POCT I-STAT TROPONIN I: Troponin i, poc: 0 ng/mL (ref 0.00–0.08)

## 2011-08-30 LAB — PROTIME-INR: Prothrombin Time: 13.1 seconds (ref 11.6–15.2)

## 2011-08-31 LAB — CARDIAC PANEL(CRET KIN+CKTOT+MB+TROPI)
CK, MB: 2 ng/mL (ref 0.3–4.0)
Relative Index: 1.1 (ref 0.0–2.5)
Total CK: 184 U/L (ref 7–232)
Troponin I: <0.3 ng/mL

## 2011-09-01 HISTORY — PX: CORONARY ANGIOPLASTY WITH STENT PLACEMENT: SHX49

## 2011-09-01 LAB — BASIC METABOLIC PANEL
BUN: 12 mg/dL (ref 6–23)
Calcium: 8.9 mg/dL (ref 8.4–10.5)
GFR calc non Af Amer: >90 mL/min (ref >90)
Glucose, Bld: 119 mg/dL — ABNORMAL HIGH (ref 70–99)
Sodium: 142 mEq/L (ref 135–145)

## 2011-09-01 NOTE — Cardiovascular Report (Signed)
NAMEMarland Kitchen  ASTOR, DOLCH NO.:  [id number]  MEDICAL RECORD NO.:  [id number]  LOCATION:  3711                         FACILITY:  [facility]  PHYSICIAN:  Italy Brianah Hopson, MD         DATE OF BIRTH:  03-Mar-1966  DATE OF PROCEDURE:  08/31/2011 DATE OF DISCHARGE:                           CARDIAC CATHETERIZATION   OPERATOR:  Italy Sayed Apostol, MD  INDICATIONS:  Chest pain, a recent positive stress test indicating septal ischemia.  HISTORY OF PRESENT ILLNESS:  Mr. Keysor is a 45 year old gentleman with a past medical history significant for coronary artery disease.  He underwent stent placement to dominant circumflex in 2003, and was found to have recurrent chest pain and underwent catheterization in 2004, had in-stent restenosis of that branch as well as a 98-99% proximal ramus stenosis.  Both were intervened on at that time and has done well up until the past few months where he has developed a crescendo angina.  He then presented with chest pain in late September and underwent nuclear stress testing which showed a septal reversible ischemia.  After being advised he is to undergo cardiac catheterization he declined and left the hospital against medical advice.  I saw him subsequently in the  office, adjusted his medications for angina and he continued to have chest pain and re-presented with chest pain, now referred for catheterization.  DESCRIPTION OF PROCEDURE:  After informed consent was obtained, the patient was brought to cardiac catheterization and after procedural radiation and safety time-outs, the patient was sterilely prepped and draped in usual fashion.  The area around the right radial artery was identified and was initially anesthetized with 5 mL of 1% lidocaine. The patient was given 2 mg of Versed and 50 mcg of fentanyl for moderate sedation.  The artery was accessed without difficulty using a straight needle and a wire and a 5-French radial sheath was  placed. Subsequently, the patient underwent cardiac catheterization with a 5- [place] TIG 4.0 catheter and a pigtail catheter.  An LV-gram was performed.  Estimated blood loss was less than 10 mL.  There were no acute complications.  It should be noted that the patient has a dye contrast allergy and was given 60 mg of IV Solu-Medrol, 50 mg of Benadryl, and 20 mg of Pepcid for approximately 3 hours prior to procedure for prophylaxis.  FINDINGS: 1. LM - no disease. 2. LAD - mild luminal irregularities, with a discrete distal     stenosis up to 30%. 3. Left circumflex - diffuse, tubular 50-60% in-stent restenosis to the     mid vessel as well as an 80-90% proximal discrete stenosis just     prior to the stent.  There was about a 70-80% distal left     circumflex stenosis, then about 80-90% stenosis in a more dominant     PDA branch.  The circumflex artery appears dominant and supplies branches     to the inferior septum. 4. Ramus - The proximal stent of the ramus is subtotally occluded at     99% with TIMI 2 flow.  There is diffuse distal disease of a small caliber    vessel distal to that. 5.  RCA - likely nondominant (or occcluded very proximally) and appears to      bifurcate just after the ostium. 6. LVEDP = 15 mmHg. 7. LVEF = 60%, no wall motion abnormalities.  IMPRESSION:  Two-vessel coronary artery disease - dominant left circumflex.  This represents a second in-stent restenosis event to the left circumflex.  PLAN:  I discussed the case with Dr. Allyson Sabal and the fact that Mr. Perri is fairly noncompliant with the medical therapy.  After reviewing the films, he felt that his only percutaneous option would be in the left circumflex and the ramus artery may be difficult to wire and has a high likelihood of restenosis.  Given the fact that he is a diabetic, I would like to refer him for CT surgery evaluation.  I understand that he is young at age 45, however, this may present a  better long-term option for him. Should he not be a CABG candidate, I would suggest an attempt at intervention to the LCX.     [place]aly Iridessa Harrow, MD     CH/MEDQ  D:  08/31/2011  T:  09/01/2011  Job:  [id number]  Electronically Signed by K. Deshanna Kama M.D. on 09/01/2011 08:48:52 PM

## 2011-09-02 LAB — BASIC METABOLIC PANEL WITH GFR
BUN: 8 mg/dL (ref 6–23)
CO2: 24 meq/L (ref 19–32)
Calcium: 8.6 mg/dL (ref 8.4–10.5)
Chloride: 105 meq/L (ref 96–112)
Creatinine, Ser: 0.68 mg/dL (ref 0.50–1.35)
GFR calc Af Amer: >90 mL/min (ref >90)
GFR calc non Af Amer: >90 mL/min (ref >90)
Glucose, Bld: 185 mg/dL — ABNORMAL HIGH (ref 70–99)
Potassium: 3.8 meq/L (ref 3.5–5.1)
Sodium: 138 meq/L (ref 135–145)

## 2011-09-02 LAB — CBC
HCT: 36.6 % — ABNORMAL LOW (ref 39.0–52.0)
MCHC: 33.6 g/dL (ref 30.0–36.0)
MCV: 78.4 fL (ref 78.0–100.0)
RDW: 13.4 % (ref 11.5–15.5)

## 2011-09-06 NOTE — Discharge Summary (Signed)
NAMEMarland Kitchen  Andrew West, Andrew West NO.:  [id number]  MEDICAL RECORD NO.:  [id number]  LOCATION:  2508                         FACILITY:  [facility]  PHYSICIAN:  [name], MD         DATE OF BIRTH:  04-Jun-1966  DATE OF ADMISSION:  08/30/2011 DATE OF DISCHARGE:  09/02/2011                              DISCHARGE SUMMARY   DISCHARGE DIAGNOSES: 1. Unstable angina with negative troponins this admission. 2. Coronary artery disease, in-stent restenosis to an obtuse marginal     with a new obtuse marginal site intervened on this admission with     Multilink Vision stents. 3. Known coronary artery disease with previous percutaneous coronary     intervention in 2004, recent admission in early September 2011     with an abnormal Myoview. The patient declined catheterization at     that time. 4. Type 2 non-insulin-dependent diabetes. 5. Treated hypertension. 6. History of statin intolerance. 7. Morbid obesity. 8. History of noncompliance in the past.  HOSPITAL COURSE:  The patient is a 45 year old male who previously has been followed by [facility] who had had a intervention to the OM in 2004.  He was dismissed from their practice in 2010 for noncompliance.  He was seen in the hospital in consult in September 2012, for evaluation of chest pain.  Myoview was abnormal at that time. We suggested catheterization, but the patient declined.  It was relatively low risk Myoview when we felt we could follow up as an outpatient.  The patient was seen by Dr. Rennis Golden in the office on August 20, 2011.  He continued to have some chest pain symptoms worrisome for angina.  At that time, he was not interested in catheterization.  We recommended increasing his nitrates.  He apparently had run out of some of his medicines because of financial reasons.  He was admitted to [facility] on August 30, 2011, with recurrent chest pain worrisome for unstable angina.  He was admitted and taken to  the Cath Lab on August 31, 2011.  This revealed old total RCA, this was a nondominant vessel.  There was in-stent restenosis in the ramus site. There was also in-stent restenosis in the circumflex site.  EF was 60% at catheterization.  After discussion and review of his films, the patient wanted to proceed with an intervention.  He wanted to have a radial approach.  This was done by Dr. Allyson Sabal on September 01, 2011.  The circumflex was intervened for the proximal in-stent restenosis and a site distal to that with a new stent.  Multilink Vision stents were used. Plan is for medical therapy of his coronary artery disease.  Dr. Tresa Endo saw him on September 02, 2011, and we feel he could be discharged. We increased his beta-blocker.  We did make some adjustments in his other medications.  He will follow up with Dr. Rennis Golden as an outpatient.  LABORATORY DATA AT DISCHARGE:  White count 12.7, hemoglobin 12.3, hematocrit 36.6, platelets 231.  Sodium 138, potassium 3.8, BUN 8, creatinine 0.68.  His enzymes are negative.  Chest x-ray done on August 30, 2011, showed no acute disease.  EKG shows sinus rhythm without  acute changes.  Please see MedRec for complete discharge medications.  DISPOSITION:  The patient was discharged IN stable condition.  He will follow up with Dr. Rennis Golden as an outpatient.     Andrew West, P.A.   ______________________________ [place] Kelby Lotspeich, MD    LKK/MEDQ  D:  09/02/2011  T:  09/02/2011  Job:  [id number]  cc:   Annia Friendly. Loleta Chance, MD  Electronically Signed by Corine Shelter P.A. on 09/03/2011 02:41:53 PM Electronically Signed by Kirtland Bouchard. Maely Clements M.D. on 09/06/2011 04:30:36 PM

## 2011-09-10 NOTE — H&P (Signed)
NAME:  Andrew West, Andrew West NO.:  [id number]  MEDICAL RECORD NO.:  [id number]  LOCATION:  [facility]                         FACILITY:  [facility]  PHYSICIAN:  Landry Corporal, MD DATE OF BIRTH:  12/29/1965  DATE OF ADMISSION:  08/30/2011 DATE OF DISCHARGE:                             HISTORY & PHYSICAL   CHIEF COMPLAINT:  Chest pain, left-sided.  HISTORY OF PRESENT ILLNESS:  Andrew West is a 45 year old African American male with a history of coronary artery disease.  He has had a previous stent in the circumflex and obtuse marginal, and 70% in-stent restenosis in the LAD back in 2004.  There is also a dominant right coronary artery that was 100% occluded and filled with collaterals.  He was just discharged on July 30, 2011, after complaining of chest pain.  He underwent a nuclear stress test at that time that showed some ischemia in the septal regions, which was reversible.  The ejection fraction at the time was 53%.  He was recommended to have a heart catheterization at that time; however, he declined.  His history also includes hypertension, type 2 diabetes mellitus, dyslipidemia, medical noncompliance, and a history of rhabdomyolysis on statins.  He presented today to the [facility] ER with chest pain that began yesterday.  It is more left-sided than during the previous admission when it was more substernal.  It was again sharp in presentation.  He also stated he had some associated back pain with it.  He was on his way to church at the time when it develops.  At its worst, it was 9/10 in intensity, and he also indicated that it is worse with inspiration and movement.  The patient stated that he tried Percocet on 2 occasions with no change in chest pain.  He did try out 1 nitroglycerin, and after about 4-5 seconds, he had some decrease in symptoms.  He also reports some shortness of breath, dizziness, and lightheadedness.  He denies any nausea, vomiting, palpitations,  abdominal pain, dysuria, hematuria, hematochezia, or melena.  He also does report headaches since he started on IV nitroglycerin.  CURRENT MEDICATIONS:  Metformin 1000 mg b.i.d., Imdur 90 mg daily, Toprol tartrate 50 mg b.i.d., Plavix 75 mg daily, aspirin 325 mg daily, lisinopril/HCTZ 20/12.5 mg half tablet daily, pravastatin 40 mg daily at bedtime, and nitroglycerin sublingual 0.4 mg 1 tablet q.5 m. p.r.n. x3.  DRUG ALLERGIES:  He has no known drug allergies.  PAST MEDICAL HISTORY:  Coronary disease status post PCI of circumflex and OM, and last catheterization was 2004, that showed a large LAD with irregularities.  Circumflex with medium-sized 70% in-stent restenosis for which he received cutting balloon RCI.  RCA was dominant with 100% occlusion and with collaterals.  OM1 had 70% and 100% in-stent restenosis.  LV EF was 55%.  He also has a history of hypertension, diabetes, hyperlipidemia, medical noncompliance, and rhabdomyolysis secondary to statins.  Also possible conversion disorder, during admission July 2011, for suspected TIA with red flag raised for a suspicion for conversion disorder.  SOCIAL HISTORY:  He is married.  He has 7 children and 11 grandchildren. He quit tobacco smoking 2 years after his first heart attack  then subsequently restarted.  He says has been smoking for the last 9 years, and has recently quit approximately 2 months ago.  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  FAMILY HISTORY:  Positive for hypertension, diabetes, heart disease, as well as cancer.  REVIEW OF SYSTEMS:  As per HPI.  PHYSICAL EXAMINATION:  VITAL SIGNS:  Blood pressure is 117/79, with a heart rate of 74, temperature is 98.2, respiratory rate 16, and oxygen saturation 98% on 2 L nasal cannula. GENERAL:  African American male, obese in no apparent distress.  He does appear somewhat uncomfortable though. HEENT:  Pupils are equal, round, and reactive to light  and accommodation.  Extraocular movements intact.  Negative scleral icterus. NECK:  Nontender.  Negative lymphadenopathy. CHEST:  Regular rate and rhythm.  Negative murmurs, rubs, or gallops. Chest wall is moderately tender, left lateral side. PULMONARY:  Clear to auscultation bilaterally.  Negative wheezes. ABDOMEN:  Nontender.  Positive bowel sounds in all quadrants. EXTREMITIES:  Peripheral vascular 2+ radial pulses.  Negative carotid or femoral bruits.  Dorsalis pedis 2+.  Negative lower extremity edema. NEUROLOGIC:  The patient is alert and oriented x3.  Strength is 4/5, equal bilateral upper and lower extremities.  LABORATORY DATA:  WBC 7.4, hemoglobin of 14.5, hematocrit 43.6, and platelets 248.  PT 13.1, INR 0.97, and D-dimer 0.23.  Sodium 141, potassium 3.7, chloride 104, carbon dioxide 27, glucose 104, BUN 13, creatinine 0.80, total bilirubin 0.3, alkaline phosphatase 73, AST 16, ALT 22, total protein 7.4, albumin 3.6, calcium 9.1, and lipase 66. Total creatine kinase was 292.  CK-MB 2.1.  Alternative index 0.7. Troponin less than 0.30, troponin POC of 0.00.  Urinalysis was within normal limits.  Chest x-ray showed heart and mediastinal contours within normal limits. No focal opacities or effusions.  No acute bony abnormalities.  No active disease.  EKG showed normal sinus rhythm with heart rate in the 90s.  IMPRESSION: 1. Chest pain, most likely musculoskeletal. 2. History of coronary disease. 3. Hypertension, treated. 4. Diabetes mellitus type 2. 5. Dyslipidemia. 6. History of medical noncompliance.  PLAN:  Andrew West chest pain is most likely musculoskeletal in nature.  It is reproducible with inspiration, movement of his left arm as well as palpitations of the left axillary and left chest region. Cardiac enzymes are negative so far.  He has had some relief with nitroglycerin.  He did have a recent nuclear stress test in September which showed some  reversibility in the septal wall, and at that time, he was recommended for a left heart catheterization.  This will be discussed with him by the MD.  We will admit to telemetry and cycle cardiac enzymes x3 q.8 h.  Continue current home medications.    ______________________________ Wilburt Finlay, PA   ______________________________ Landry Corporal, MD    BH/MEDQ  D:  08/30/2011  T:  08/30/2011  Job:  [id number]  cc:   [place] Hilty, MD Annia Friendly. Loleta Chance, MD  Electronically Signed by Wilburt Finlay PA on 09/10/2011 02:15:02 PM Electronically Signed by Bryan Lemma MD on 09/10/2011 03:14:50 PM

## 2011-09-24 NOTE — Cardiovascular Report (Signed)
NAME:  Andrew West, HASSER NO.:  [id number]  MEDICAL RECORD NO.:  [id number]  LOCATION:  2508                         FACILITY:  [facility]  PHYSICIAN:  Nanetta Batty, M.D.   DATE OF BIRTH:  1966-06-17  DATE OF PROCEDURE: DATE OF DISCHARGE:                           CARDIAC CATHETERIZATION   HISTORY OF PRESENT ILLNESS:  Mr. Brant is an unfortunate 45 year old African American male with history of CAD status post RCA, LAD, and circumflex, as well as ramus branch stenting in the past.  His other problems include hypertension, diabetes, dyslipidemia, history of tobacco abuse.  He was admitted on August 30, 2011, with unstable angina.  He ruled out for myocardial infarction.  He was cathed by Dr. [place] Hilty revealing a subtotally occluded ramus branch with high-grade disease in his AV groove circumflex and PDA.  He did have an occluded RCA.  He presents now for percutaneous intervention of his circumflex and PDA via the right radial approach at his request.  PROCEDURE DESCRIPTION:  The patient was brought to the second floor [facility] in the postabsorptive state.  He was premedicated with p.o. Valium, IV Versed and fentanyl.  His right wrist was prepped and shaved in usual sterile fashion.  Xylocaine 1% was used for local anesthesia.  A 6-French sheath was inserted into the right radial artery using a standard back wall pullback technique.  Radial cocktail 5 mL was administered via the side-arm sheath.  The patient received Angiomax bolus with an ACT of 380.  Total contrast administered to the patient was 130 mL.  Using a 6-French XB 3.5 guide catheter along with an 0.014 x 190 Asahi soft wire a 2.0 x 12  Amphirion balloon predilatation performed on the mid AV groove circumflex at the site of "in-stent restenosis" as well as in the distal PDA branch.  The patient then received 200 mcg of intracoronary nitroglycerin.  Following this, the PDA was  stented with a 2.25 x 18 Mini Vision bare-metal stent at 14 atmospheres (2.3 mm).  The mid AV groove area of "in-stent restenosis" was stented with a 2.75 x 15 at 14 atmospheres.  The entire mid AV groove circumflex was then dilated with a 3.0 x 20 MC  Amphirion at 14-16 atmospheres and the proximal portion of that which was the area of restenting for in-stent restenosis was post dilated with a 3.25 x12 Zapata Quantum at 16 atmospheres (3.3 mm) resulting reduction of 95% mid AV groove "in-stent restenosis" to 0% residual, and a 75-80% PDA lesion to 0% with TIMI-3 flow.  The patient tolerated the procedure well.  There were no hemodynamic or electrocardiographic sequelae.  The guidewire and catheter removed.  The sheath was removed and the TR band was placed on the wrist with 12 mL of air achieving patent hemostasis.  The patient left the lab in stable condition.  He received an additional 300 mg of p.o. Plavix prior to intervention.  IMPRESSION:  Successful circumflex percutaneous coronary intervention and stenting using bare-metal stents in both the mid atrioventricular groove circumflex and posterior descending artery.  The patient will be hydrated overnight, treated with aspirin and Plavix and discharged home in the morning.  He left the lab in stable condition.     Nanetta Batty, M.D.     Cordelia Pen  D:  09/01/2011  T:  09/01/2011  Job:  [id number]  cc:   Cardiac Cath Lab Second Floor [facility] [place] Hilty, MD  Electronically Signed by Nanetta Batty M.D. on 09/24/2011 05:26:00 PM

## 2011-10-01 DIAGNOSIS — R079 Chest pain, unspecified: Secondary | ICD-10-CM | POA: Insufficient documentation

## 2011-10-02 ENCOUNTER — Other Ambulatory Visit: Payer: Self-pay

## 2011-10-02 ENCOUNTER — Emergency Department (HOSPITAL_COMMUNITY)
Admission: EM | Admit: 2011-10-02 | Discharge: 2011-10-02 | Discharge status: Against Medical Advice | Payer: BC Managed Care – PPO | Attending: Emergency Medicine | Admitting: Emergency Medicine

## 2011-10-02 ENCOUNTER — Encounter: Payer: Self-pay | Admitting: Emergency Medicine

## 2011-10-02 HISTORY — DX: Essential (primary) hypertension: I10

## 2011-10-02 NOTE — ED Notes (Signed)
Pt c/o left upper chest pain off and on since yesterday am.  No nausea or vomiting, denies diaphoresis or shortness of breath.  Pt sleeping in triage chair

## 2011-10-17 ENCOUNTER — Other Ambulatory Visit: Payer: Self-pay

## 2011-10-17 ENCOUNTER — Encounter (HOSPITAL_COMMUNITY): Payer: Self-pay | Admitting: Emergency Medicine

## 2011-10-17 ENCOUNTER — Emergency Department (HOSPITAL_COMMUNITY): Payer: BC Managed Care – PPO

## 2011-10-17 ENCOUNTER — Inpatient Hospital Stay (HOSPITAL_COMMUNITY)
Admission: EM | Admit: 2011-10-17 | Discharge: 2011-10-19 | DRG: 853 | Disposition: A | Discharge status: Home / Self Care | Payer: BC Managed Care – PPO | Source: Ambulatory Visit | Attending: Cardiology | Admitting: Cardiology

## 2011-10-17 DIAGNOSIS — I10 Essential (primary) hypertension: Secondary | ICD-10-CM

## 2011-10-17 DIAGNOSIS — F329 Major depressive disorder, single episode, unspecified: Secondary | ICD-10-CM

## 2011-10-17 DIAGNOSIS — E119 Type 2 diabetes mellitus without complications: Secondary | ICD-10-CM

## 2011-10-17 DIAGNOSIS — M5412 Radiculopathy, cervical region: Secondary | ICD-10-CM

## 2011-10-17 DIAGNOSIS — I214 Non-ST elevation (NSTEMI) myocardial infarction: Principal | ICD-10-CM

## 2011-10-17 DIAGNOSIS — F17200 Nicotine dependence, unspecified, uncomplicated: Secondary | ICD-10-CM

## 2011-10-17 DIAGNOSIS — Z87891 Personal history of nicotine dependence: Secondary | ICD-10-CM

## 2011-10-17 DIAGNOSIS — I2510 Atherosclerotic heart disease of native coronary artery without angina pectoris: Secondary | ICD-10-CM

## 2011-10-17 DIAGNOSIS — R310 Gross hematuria: Secondary | ICD-10-CM

## 2011-10-17 DIAGNOSIS — R079 Chest pain, unspecified: Secondary | ICD-10-CM

## 2011-10-17 DIAGNOSIS — J309 Allergic rhinitis, unspecified: Secondary | ICD-10-CM

## 2011-10-17 DIAGNOSIS — K219 Gastro-esophageal reflux disease without esophagitis: Secondary | ICD-10-CM

## 2011-10-17 DIAGNOSIS — Z7982 Long term (current) use of aspirin: Secondary | ICD-10-CM

## 2011-10-17 DIAGNOSIS — K625 Hemorrhage of anus and rectum: Secondary | ICD-10-CM

## 2011-10-17 DIAGNOSIS — K921 Melena: Secondary | ICD-10-CM

## 2011-10-17 DIAGNOSIS — R7989 Other specified abnormal findings of blood chemistry: Secondary | ICD-10-CM

## 2011-10-17 DIAGNOSIS — Z23 Encounter for immunization: Secondary | ICD-10-CM

## 2011-10-17 DIAGNOSIS — I252 Old myocardial infarction: Secondary | ICD-10-CM

## 2011-10-17 DIAGNOSIS — E669 Obesity, unspecified: Secondary | ICD-10-CM

## 2011-10-17 DIAGNOSIS — G609 Hereditary and idiopathic neuropathy, unspecified: Secondary | ICD-10-CM

## 2011-10-17 DIAGNOSIS — E7800 Pure hypercholesterolemia, unspecified: Secondary | ICD-10-CM

## 2011-10-17 DIAGNOSIS — R1032 Left lower quadrant pain: Secondary | ICD-10-CM

## 2011-10-17 DIAGNOSIS — R1013 Epigastric pain: Secondary | ICD-10-CM

## 2011-10-17 DIAGNOSIS — F3289 Other specified depressive episodes: Secondary | ICD-10-CM

## 2011-10-17 DIAGNOSIS — E785 Hyperlipidemia, unspecified: Secondary | ICD-10-CM

## 2011-10-17 DIAGNOSIS — M549 Dorsalgia, unspecified: Secondary | ICD-10-CM

## 2011-10-17 DIAGNOSIS — Z955 Presence of coronary angioplasty implant and graft: Secondary | ICD-10-CM

## 2011-10-17 DIAGNOSIS — Z7902 Long term (current) use of antithrombotics/antiplatelets: Secondary | ICD-10-CM

## 2011-10-17 DIAGNOSIS — Z872 Personal history of diseases of the skin and subcutaneous tissue: Secondary | ICD-10-CM

## 2011-10-17 DIAGNOSIS — Z860100 Personal history of colon polyps, unspecified: Secondary | ICD-10-CM

## 2011-10-17 HISTORY — PX: CORONARY ANGIOPLASTY WITH STENT PLACEMENT: SHX49

## 2011-10-17 LAB — DIFFERENTIAL
Basophils Relative: 0 % (ref 0–1)
Eosinophils Absolute: 0.1 10*3/uL (ref 0.0–0.7)
Monocytes Relative: 9 % (ref 3–12)
Neutrophils Relative %: 53 % (ref 43–77)

## 2011-10-17 LAB — CARDIAC PANEL(CRET KIN+CKTOT+MB+TROPI)
CK, MB: 12.2 ng/mL (ref 0.3–4.0)
Relative Index: 3.3 — ABNORMAL HIGH (ref 0.0–2.5)
Total CK: 371 U/L — ABNORMAL HIGH (ref 7–232)
Troponin I: 2.07 ng/mL (ref <0.30)

## 2011-10-17 LAB — BASIC METABOLIC PANEL
BUN: 10 mg/dL (ref 6–23)
GFR calc Af Amer: >90 mL/min (ref >90)
GFR calc non Af Amer: >90 mL/min (ref >90)
Potassium: 3.7 mEq/L (ref 3.5–5.1)

## 2011-10-17 LAB — CBC
Hemoglobin: 14.5 g/dL (ref 13.0–17.0)
MCH: 27.4 pg (ref 26.0–34.0)
MCHC: 34 g/dL (ref 30.0–36.0)
Platelets: 233 10*3/uL (ref 150–400)

## 2011-10-17 LAB — POCT I-STAT TROPONIN I: Troponin i, poc: 0.58 ng/mL (ref 0.00–0.08)

## 2011-10-17 LAB — GLUCOSE, CAPILLARY: Glucose-Capillary: 123 mg/dL — ABNORMAL HIGH (ref 70–99)

## 2011-10-17 MED ORDER — GI COCKTAIL ~~LOC~~
30.0000 mL | Freq: Once | ORAL | Status: AC
  Administered 2011-10-17: 30 mL via ORAL
  Filled 2011-10-17: qty 30

## 2011-10-17 MED ORDER — ONDANSETRON HCL 4 MG/2ML IJ SOLN: 4.0000 mg | Freq: Four times a day (QID) | INTRAMUSCULAR | Status: DC | PRN

## 2011-10-17 MED ORDER — PNEUMOCOCCAL VAC POLYVALENT 25 MCG/0.5ML IJ INJ
0.5000 mL | INJECTION | INTRAMUSCULAR | Status: DC
  Filled 2011-10-17: qty 0.5

## 2011-10-17 MED ORDER — NITROGLYCERIN IN D5W 200-5 MCG/ML-% IV SOLN
5.0000 ug/min | INTRAVENOUS | Status: DC
  Administered 2011-10-17: 5 ug/min via INTRAVENOUS
  Filled 2011-10-17: qty 250

## 2011-10-17 MED ORDER — CLOPIDOGREL BISULFATE 75 MG PO TABS
75.0000 mg | ORAL_TABLET | Freq: Every day | ORAL | Status: DC
  Administered 2011-10-18 – 2011-10-19 (×2): 75 mg via ORAL
  Filled 2011-10-17 (×3): qty 1

## 2011-10-17 MED ORDER — METOPROLOL TARTRATE 50 MG PO TABS
50.0000 mg | ORAL_TABLET | Freq: Two times a day (BID) | ORAL | Status: DC
  Administered 2011-10-17 – 2011-10-18 (×2): 50 mg via ORAL
  Filled 2011-10-17 (×3): qty 1

## 2011-10-17 MED ORDER — HEPARIN BOLUS VIA INFUSION
4000.0000 [IU] | Freq: Once | INTRAVENOUS | Status: AC
  Administered 2011-10-17: 4000 [IU] via INTRAVENOUS

## 2011-10-17 MED ORDER — SIMVASTATIN 20 MG PO TABS
20.0000 mg | ORAL_TABLET | Freq: Every day | ORAL | Status: DC
  Administered 2011-10-18: 20 mg via ORAL
  Filled 2011-10-17 (×2): qty 1

## 2011-10-17 MED ORDER — MORPHINE SULFATE 4 MG/ML IJ SOLN
4.0000 mg | Freq: Once | INTRAMUSCULAR | Status: AC
  Administered 2011-10-17: 4 mg via INTRAVENOUS
  Filled 2011-10-17: qty 1

## 2011-10-17 MED ORDER — ACETAMINOPHEN 325 MG PO TABS: 650.0000 mg | ORAL_TABLET | ORAL | Status: DC | PRN

## 2011-10-17 MED ORDER — SODIUM CHLORIDE 0.9 % IV SOLN
1.0000 mL/kg/h | INTRAVENOUS | Status: DC
Start: 2011-10-18 — End: 2011-10-18
  Administered 2011-10-18: 1.004 mL/kg/h via INTRAVENOUS

## 2011-10-17 MED ORDER — ASPIRIN BUFFERED 325 MG PO TABS: 325.0000 mg | ORAL_TABLET | Freq: Every day | ORAL | Status: DC

## 2011-10-17 MED ORDER — METOPROLOL TARTRATE 50 MG PO TABS
150.0000 mg | ORAL_TABLET | Freq: Two times a day (BID) | ORAL | Status: DC
  Filled 2011-10-17: qty 1

## 2011-10-17 MED ORDER — ONDANSETRON 4 MG PO TBDP
4.0000 mg | ORAL_TABLET | Freq: Once | ORAL | Status: AC
  Administered 2011-10-17: 4 mg via ORAL
  Filled 2011-10-17: qty 1

## 2011-10-17 MED ORDER — HEPARIN BOLUS VIA INFUSION
3000.0000 [IU] | INTRAVENOUS | Status: AC
  Administered 2011-10-17: 3000 [IU] via INTRAVENOUS
  Filled 2011-10-17: qty 3000

## 2011-10-17 MED ORDER — ASPIRIN EC 325 MG PO TBEC
325.0000 mg | DELAYED_RELEASE_TABLET | Freq: Every day | ORAL | Status: DC
  Administered 2011-10-19: 325 mg via ORAL
  Filled 2011-10-17: qty 1

## 2011-10-17 MED ORDER — PANTOPRAZOLE SODIUM 20 MG PO TBEC
20.0000 mg | DELAYED_RELEASE_TABLET | Freq: Every day | ORAL | Status: DC
  Administered 2011-10-18 – 2011-10-19 (×2): 20 mg via ORAL
  Filled 2011-10-17 (×3): qty 1

## 2011-10-17 MED ORDER — ASPIRIN 81 MG PO CHEW
324.0000 mg | CHEWABLE_TABLET | ORAL | Status: AC
  Administered 2011-10-18: 324 mg via ORAL
  Filled 2011-10-17: qty 4

## 2011-10-17 MED ORDER — HEPARIN (PORCINE) IN NACL 100-0.45 UNIT/ML-% IJ SOLN
1200.0000 [IU]/h | INTRAMUSCULAR | Status: DC
  Administered 2011-10-17 (×2): 1200 [IU]/h via INTRAVENOUS
  Filled 2011-10-17: qty 250

## 2011-10-17 MED ORDER — HEPARIN (PORCINE) IN NACL 100-0.45 UNIT/ML-% IJ SOLN
2000.0000 [IU]/h | INTRAMUSCULAR | Status: DC
  Administered 2011-10-18: 1550 [IU]/h via INTRAVENOUS
  Filled 2011-10-17 (×3): qty 250

## 2011-10-17 MED ORDER — DIAZEPAM 5 MG PO TABS
5.0000 mg | ORAL_TABLET | ORAL | Status: AC
  Administered 2011-10-18: 5 mg via ORAL
  Filled 2011-10-17: qty 1

## 2011-10-17 NOTE — ED Provider Notes (Signed)
See prior note   Ward Givens, MD 10/17/11 1536

## 2011-10-17 NOTE — H&P (Signed)
Andrew West is an 45 y.o. male.   Chief Complaint: CP HPI:  The the patient is a 45 year old black male with history of coronary artery disease (h/o MI ~2 y ago), in-stent restenosis to an obtuse marginal  with a new obtuse marginal site intervened in Oct 2012 with Multilink Vision stents, known coronary artery disease with previous percutaneous coronary intervention in 2004. Prior to his October admission, he was admitted in early September 2011 with an abnormal Myoview, but he declined catheterization at that time.  PMH: type 2 non-insulin-dependent diabetes, treated hypertension, history of statin intolerance, morbid obesity, history of noncompliance in the past. The patient states he developed a burning feeling in his chest last Thursday. It was a dull in sensation. At its worst, was 8 to 9 out of 10 in intensity. Currently experiencing 4/10 CP.  States that he's been while walking in the morning. This morning he walked without any chest discomfort. Was feeling better but he still having a nagging chest discomfort so he decided to come to the emergency room.     Past Medical History  Diagnosis Date  . Myocardial infarction 2 yrs ago  . Hypertension   . Diabetes mellitus   . Coronary artery disease   . Heart disease   . Hypercholesteremia     Past Surgical History  Procedure Date  . Coronary angioplasty with stent placement 09/01/11    History reviewed. No pertinent family history. Social History:  reports that he has quit smoking. He does not have any smokeless tobacco history on file. He reports that he does not drink alcohol or use illicit drugs.  Meds Medications Prior to Admission  Medication Dose Route Frequency Provider Last Rate Last Dose  . gi cocktail  30 mL Oral Once Carolee Rota, [place]   30 mL at 10/17/11 1025  . heparin 100 units/mL bolus via infusion 4,000 Units  4,000 Units Intravenous Once Santina Evans, PHARMD   4,000 Units at 10/17/11 1240  . heparin ADULT  infusion 100 units/mL (25000 units/250 mL)  1,200 Units/hr Intravenous Continuous Santina Evans, PHARMD 12 mL/hr at 10/17/11 1412 1,200 Units/hr at 10/17/11 1412  . morphine 4 MG/ML injection 4 mg  4 mg Intravenous Once Carolee Rota, PA   4 mg at 10/17/11 1201  . morphine 4 MG/ML injection 4 mg  4 mg Intravenous Once Carolee Rota, PA   4 mg at 10/17/11 1427  . ondansetron (ZOFRAN-ODT) disintegrating tablet 4 mg  4 mg Oral Once Carolee Rota, PA   4 mg at 10/17/11 1200   Medications Prior to Admission  Medication Sig Dispense Refill  . clopidogrel (PLAVIX) 75 MG tablet TAKE ONE TABLET BY MOUTH EVERY DAY  30 tablet  12    Allergies:  Allergies  Allergen Reactions  . Iohexol      Desc: PT BECAME SOB AND CHEST TIGHTNESS AFTER CONTRAST INJECTION.  [name],RT-RCT., Onset Date: [id number]     Medications Prior to Admission  Medication Dose Route Frequency Provider Last Rate Last Dose  . gi cocktail  30 mL Oral Once Carolee Rota, [place]   30 mL at 10/17/11 1025  . heparin 100 units/mL bolus via infusion 4,000 Units  4,000 Units Intravenous Once Santina Evans, PHARMD   4,000 Units at 10/17/11 1240  . heparin ADULT infusion 100 units/mL (25000 units/250 mL)  1,200 Units/hr Intravenous Continuous Santina Evans, PHARMD 12 mL/hr at 10/17/11 1240 1,200 Units/hr at 10/17/11 1240  .  morphine 4 MG/ML injection 4 mg  4 mg Intravenous Once Carolee Rota, PA   4 mg at 10/17/11 1201  . ondansetron (ZOFRAN-ODT) disintegrating tablet 4 mg  4 mg Oral Once Carolee Rota, PA   4 mg at 10/17/11 1200   Medications Prior to Admission  Medication Sig Dispense Refill  . clopidogrel (PLAVIX) 75 MG tablet TAKE ONE TABLET BY MOUTH EVERY DAY  30 tablet  12    Results for orders placed during the hospital encounter of 10/17/11 (from the past 48 hour(s))  CBC     Status: Normal   Collection Time   10/17/11 10:21 AM      Component Value Range Comment   WBC 8.7  4.0 - 10.5 (K/uL)     RBC 5.30  4.22 - 5.81 (MIL/uL)    Hemoglobin 14.5  13.0 - 17.0 (g/dL)    HCT 16.1  09.6 - 04.5 (%)    MCV 80.4  78.0 - 100.0 (fL)    MCH 27.4  26.0 - 34.0 (pg)    MCHC 34.0  30.0 - 36.0 (g/dL)    RDW 40.9  81.1 - 91.4 (%)    Platelets 233  150 - 400 (K/uL)   DIFFERENTIAL     Status: Normal   Collection Time   10/17/11 10:21 AM      Component Value Range Comment   Neutrophils Relative 53  43 - 77 (%)    Neutro Abs 4.6  1.7 - 7.7 (K/uL)    Lymphocytes Relative 37  12 - 46 (%)    Lymphs Abs 3.2  0.7 - 4.0 (K/uL)    Monocytes Relative 9  3 - 12 (%)    Monocytes Absolute 0.8  0.1 - 1.0 (K/uL)    Eosinophils Relative 1  0 - 5 (%)    Eosinophils Absolute 0.1  0.0 - 0.7 (K/uL)    Basophils Relative 0  0 - 1 (%)    Basophils Absolute 0.0  0.0 - 0.1 (K/uL)   BASIC METABOLIC PANEL     Status: Abnormal   Collection Time   10/17/11 10:21 AM      Component Value Range Comment   Sodium 139  135 - 145 (mEq/L)    Potassium 3.7  3.5 - 5.1 (mEq/L)    Chloride 103  96 - 112 (mEq/L)    CO2 26  19 - 32 (mEq/L)    Glucose, Bld 110 (*) 70 - 99 (mg/dL)    BUN 10  6 - 23 (mg/dL)    Creatinine, Ser 7.82  0.50 - 1.35 (mg/dL)    Calcium 9.0  8.4 - 10.5 (mg/dL)    GFR calc non Af Amer >90  >90 (mL/min)    GFR calc Af Amer >90  >90 (mL/min)   POCT I-STAT TROPONIN I     Status: Abnormal   Collection Time   10/17/11 10:32 AM      Component Value Range Comment   Troponin i, poc 0.58 (*) 0.00 - 0.08 (ng/mL)    Comment NOTIFIED PHYSICIAN      Comment 3             Dg Chest 2 View  10/17/2011  *RADIOLOGY REPORT*  Clinical Data: Chest pain, cough  CHEST - 2 VIEW  Comparison: 08/30/2011  Findings: Some increase in central pulmonary vascular congestion. No confluent airspace infiltrate or overt interstitial edema.  No effusion.  Heart size upper limits normal.  Regional bones unremarkable.  Coronary  stent noted.  IMPRESSION:  1.  Interval increase in central pulmonary vascular congestion.  Original Report  Authenticated By: Osa Craver, M.D.    Review of Systems  Constitutional: Negative for diaphoresis.  Respiratory: Positive for cough. Negative for shortness of breath.   Cardiovascular: Positive for chest pain. Negative for palpitations, orthopnea and PND.  Gastrointestinal: Negative for nausea, vomiting, abdominal pain, blood in stool and melena.  Genitourinary: Negative for hematuria.    Blood pressure 143/98, pulse 66, temperature 97.5 F (36.4 C), temperature source Oral, resp. rate 18, height 5\' 8"  (1.727 m), weight 106.595 kg (235 lb), SpO2 97.00%. Physical Exam  Constitutional: He is oriented to person, place, and time. He is cooperative.       Obese  HENT:  Head: Normocephalic and atraumatic.  Eyes: EOM are normal. Pupils are equal, round, and reactive to light. No scleral icterus.  Neck: No JVD present.  Cardiovascular: Normal rate and regular rhythm.   No murmur heard.      Split s2  Respiratory: Effort normal and breath sounds normal. He has no wheezes.  GI: Soft. Bowel sounds are normal. There is no tenderness.  Musculoskeletal: He exhibits edema.       1+ right LEE  Lymphadenopathy:    He has no cervical adenopathy.  Neurological: He is alert and oriented to person, place, and time.       Strength 4/5 equal upper/lower ext      Assessment/Plan  Patient Active Hospital Problem List:  NSTEMI  - with known CAD, recent PCI.  CHEST PAIN (04/25/2008)  DM (01/30/2008)   HYPERLIPIDEMIA (04/25/2008)   OBESITY (04/18/2008)  HYPERTENSION (04/18/2008) - well controlled    Plan:  Troponin 0.58. The patient be admitted to step unit. Continue IV heparin and started on IV nitroglycerin. We will cycle cardiac enzymes x3 every 8 hours.  Continue Plavix, Lopressor,  Aspirin,   Lisinopril,  Statin. He was rescheduled for left heart catheterization 4 November 26th - radial approach.  We will start sliding scale insulin coverage. Heart healthy diet.  I will also start IV Lasix  40 mg times one.   HAGER,BRYAN W 10/17/2011, 1:57 PM  I have seen and examined the patient along with Wilburt Finlay, PA.  I have reviewed the chart, notes and new data.  I agree with Bryan's note.  Key new complaints: Recurrent chest discomfort - after prolonged episode earlier this week. Key examination changes: Normal exam except for LE edema.  Key new findings / data: Troponin 0.6 (mildly +)   PLAN: Based upon his history with recent PCI, now presenting with chest discomfort that does not seem typical for his angina but with positive troponin, we will treat as ACS/NSTEMI. Continue ASA/Plavix & IV Heparin.  IV NTG, continue statin - keep CP free. Continue to cycle cardiac biomarkers.  ECG has borderline ST elevation in V2 & V3 but no reciprocal changes - does not meet STEMI criteria.   SSI for DM Plan LHC tomorrow or Tues by R radial approach.  Errin Whitelaw W [facility] [address] [place], [place]  [address]  [phone]  10/17/2011 3:38 PM

## 2011-10-17 NOTE — Progress Notes (Addendum)
ANTICOAGULATION CONSULT NOTE - Follow up Consult  Pharmacy Consult for Heparin Indication: chest pain/ACS    Allergies  Allergen Reactions  . Iohexol      Desc: PT BECAME SOB AND CHEST TIGHTNESS AFTER CONTRAST INJECTION.  [name],RT-RCT., Onset Date: [id number]     Patient Measurements: Height: 5\' 8"  (172.7 cm) Weight: 270 lb 8.1 oz (122.7 kg) IBW/kg (Calculated) : 68.4  Adjusted Body Weight: 91.6kg  Vital Signs: Temp: 97.5 F (36.4 C) (11/25 1215) Temp src: Oral (11/25 1215) BP: 127/80 mmHg (11/25 1800) Pulse Rate: 68  (11/25 1800)  Labs:  Basename 10/17/11 1836 10/17/11 1834 10/17/11 1021  HGB -- -- 14.5  HCT -- -- 42.6  PLT -- -- 233  APTT -- -- --  LABPROT -- -- --  INR -- -- --  HEPARINUNFRC -- <0.10* --  CREATININE -- -- 0.77  CKTOTAL 371* -- --  CKMB 12.2* -- --  TROPONINI 2.07* -- --   Estimated Creatinine Clearance: 148.6 ml/min (by C-G formula based on Cr of 0.77).  Medical History: Past Medical History  Diagnosis Date  . Myocardial infarction 2 yrs ago  . Hypertension   . Diabetes mellitus   . Heart disease   . Hypercholesteremia   . Coronary artery disease   . Angina     Medications:  Scheduled:     . aspirin  324 mg Oral Pre-Cath  . aspirin EC  325 mg Oral Daily  . clopidogrel  75 mg Oral Q breakfast  . diazepam  5 mg Oral Pre-Cath  . gi cocktail  30 mL Oral Once  . heparin  4,000 Units Intravenous Once  . metoprolol tartrate  50 mg Oral BID  .  morphine injection  4 mg Intravenous Once  .  morphine injection  4 mg Intravenous Once  . ondansetron  4 mg Oral Once  . pantoprazole  20 mg Oral Daily  . pneumococcal 23 valent vaccine  0.5 mL Intramuscular Tomorrow-1000  . simvastatin  20 mg Oral q1800  . DISCONTD: aspirin  325 mg Oral Daily  . DISCONTD: metoprolol  150 mg Oral BID   Infusions:     . sodium chloride    . heparin 1,200 Units/hr (10/17/11 1412)  . nitroGLYCERIN 5 mcg/min (10/17/11 1800)   Assessment: 45 yo  M with hx of CAD, MI, HTN, HLD, DM now on IV Heparin drip for r/o ACS/NSTEMI. Heparin level is < 0.1 on 1200 units/hr.  No bleeding reported.  Patient weighs 122.7  kg,  BMI =41.  RN reports no complications with IV heparin infusion.   Goal of Therapy:  Heparin level 0.3-0.7 units/ml   Plan:  -Heparin Bolus 3000 units now x1.  -Increase  heparin gtt at 1550 units/hr - check 6 hr heparin level - daily CBC and heparin levels.     Thank you for the consult  Arman Filter, PharmD [phone] 10/17/2011,8:39 PM

## 2011-10-17 NOTE — ED Provider Notes (Signed)
History     CSN: [id number] Arrival date & time: 10/17/2011  9:54 AM   First MD Initiated Contact with Patient 10/17/11 [phone]      Chief Complaint  Patient presents with  . Chest Pain    onset Friday after eating    (Consider location/radiation/quality/duration/timing/severity/associated sxs/prior treatment) HPI Comments: Patient with past medical history of multiple stents placed, most recently 2 placed in October 2012 - presents with a three-day history of intermittent bilateral chest pain described as aching and substernal chest pain described as burning. Episodes of pain lasts several hours at a time. It is not exertional. No radiation of pain, no nausea, no shortness of breath. The patient's pain was worse this morning and he took a 325 mg aspirin as well as nitroglycerin which did not relieve the pain. He states he has been compliant with his Plavix therapy.   Patient is a 45 y.o. male presenting with chest pain. The history is provided by the patient.  Chest Pain The chest pain began 3 - 5 days ago. Chest pain occurs intermittently. The chest pain is worsening. The pain is currently at 6/10. The quality of the pain is described as burning. The pain does not radiate. Pertinent negatives for primary symptoms include no fever, no syncope, no shortness of breath, no cough, no palpitations, no abdominal pain, no nausea, no vomiting and no dizziness. He tried aspirin and nitroglycerin for the symptoms.  His past medical history is significant for MI.  Procedure history is positive for cardiac catheterization.     Past Medical History  Diagnosis Date  . Myocardial infarction 2 yrs ago  . Hypertension   . Diabetes mellitus   . Coronary artery disease   . Heart disease   . Hypercholesteremia     Past Surgical History  Procedure Date  . Coronary angioplasty with stent placement 09/01/11    History reviewed. No pertinent family history.  History  Substance Use Topics  .  Smoking status: Former Games developer  . Smokeless tobacco: Not on file   Comment: Pt st's he smoked 1 cig. tody to see if it would ease the pain  . Alcohol Use: No      Review of Systems  Constitutional: Negative for fever and chills.  HENT: Negative for sore throat and rhinorrhea.   Eyes: Negative for redness.  Respiratory: Negative for cough and shortness of breath.   Cardiovascular: Positive for chest pain. Negative for palpitations and syncope.  Gastrointestinal: Negative for nausea, vomiting, abdominal pain, diarrhea and constipation.  Genitourinary: Negative for dysuria.  Musculoskeletal: Negative for myalgias.  Skin: Negative for rash.  Neurological: Negative for dizziness and headaches.    Allergies  Iohexol  Home Medications   Current Outpatient Rx  Name Route Sig Dispense Refill  . CLOPIDOGREL BISULFATE 75 MG PO TABS  TAKE ONE TABLET BY MOUTH EVERY DAY 30 tablet 12  . OXYCODONE-ACETAMINOPHEN 10-325 MG PO TABS Oral Take 1 tablet by mouth every 4 (four) hours as needed. Pain       BP 156/107  Pulse 79  Temp(Src) 97.7 F (36.5 C) (Oral)  Resp 20  SpO2 100%  Physical Exam  Nursing note and vitals reviewed. Constitutional: He is oriented to person, place, and time. He appears well-developed and well-nourished.  HENT:  Head: Normocephalic and atraumatic.  Eyes: Conjunctivae are normal. Right eye exhibits no discharge. Left eye exhibits no discharge.  Neck: Normal range of motion. Neck supple. No JVD present.  Cardiovascular: Normal rate, regular  rhythm, normal heart sounds and intact distal pulses.  Exam reveals no gallop and no friction rub.   No murmur heard. Pulmonary/Chest: Effort normal and breath sounds normal. He has no wheezes. He has no rales. He exhibits no tenderness.  Abdominal: Soft. Bowel sounds are normal. There is no tenderness. There is no rebound and no guarding.  Musculoskeletal: He exhibits no edema.  Neurological: He is alert and oriented to  person, place, and time.  Skin: Skin is warm and dry.  Psychiatric: He has a normal mood and affect.    ED Course  Procedures (including critical care time)  Labs Reviewed - No data to display No results found.   No diagnosis found.  He has had a previous stent in the circumflex and obtuse marginal, and 70% in-stent  restenosis in the LAD back in 2004. There is also a dominant right  coronary artery that was 100% occluded and filled with collaterals.   Cath 08/2011: Successful circumflex percutaneous coronary intervention  and stenting using bare-metal stents in both the mid atrioventricular  groove circumflex and posterior descending artery.   10:21 AM patient seen and examined. He has had aspirin prior to arrival. EKG reviewed with out signs of ischemia.   Date: 10/17/2011  Rate: 76  Rhythm: normal sinus rhythm  QRS Axis: normal  Intervals: normal  ST/T Wave abnormalities: normal  Conduction Disutrbances:right bundle branch block, incomplete  Narrative Interpretation:   Old EKG Reviewed: unchanged  11:05 AM Troponin 0.58, pt informed. D/w Dr. Lynelle Doctor. Patient with some relief with GI cocktail. Pain remains 6/10. Morphine ordered. Page to [facility] pending.   11:27 AM Spoke with [facility] who will see. Heparin ordered.    MDM  Admit for chest pain in the setting of previous stenting and elevated troponin.        Carolee Rota, [place] 10/17/11 1351

## 2011-10-17 NOTE — ED Notes (Signed)
Held pt's hand and distracted him while RN starting an IV, pt has a phobia of needles

## 2011-10-17 NOTE — ED Provider Notes (Signed)
Patient relates he has a history of coronary artery disease. He relates for the past 3 days he's had a constant left left lateral chest wall pain with a burning in the central part of his chest. He states nothing makes it worse nothing makes it better. He states today he walked 2 blocks without difficulty. He denies shortness of breath nausea or vomiting. He states it feels different from his anginal pain he had before. Patient is alert cooperative in no apparent distress at this point he is in no respiratory distress.  Medical screening examination/treatment/procedure(s) were conducted as a shared visit with non-physician practitioner(s) and myself.  I personally evaluated the patient during the encounter Devoria Albe, MD, Franz Dell, MD 10/17/11 [phone]

## 2011-10-17 NOTE — Progress Notes (Signed)
ANTICOAGULATION CONSULT NOTE - Initial Consult  Pharmacy Consult for Heparin Indication: chest pain/ACS  Assessment: 45 yo M with hx of CAD, MI, HTN, HLD, DM. Presents with chest pain over last three days. To be started on Heparin for r/o ACS/NSTEMI. Initial CBC wnl platelets 233.   Goal of Therapy:  Heparin level 0.3-0.7 units/ml   Plan:  -Heparin Bolus 4000 units now - Start heparin gtt at 1200 units/hr - check 6 hr heparin level - daily CBC and heparin levels  Allergies  Allergen Reactions  . Iohexol      Desc: PT BECAME SOB AND CHEST TIGHTNESS AFTER CONTRAST INJECTION.  [name],RT-RCT., Onset Date: 16109604     Patient Measurements: Height: 5\' 8"  (172.7 cm) Weight: 235 lb (106.595 kg) IBW/kg (Calculated) : 68.4  Adjusted Body Weight: 91.6kg  Vital Signs: Temp: 97.5 F (36.4 C) (11/25 1215) Temp src: Oral (11/25 1215) BP: 143/98 mmHg (11/25 1215) Pulse Rate: 66  (11/25 1215)  Labs:  Basename 10/17/11 1021  HGB 14.5  HCT 42.6  PLT 233  APTT --  LABPROT --  INR --  HEPARINUNFRC --  CREATININE 0.77  CKTOTAL --  CKMB --  TROPONINI --   Estimated Creatinine Clearance: 138 ml/min (by C-G formula based on Cr of 0.77).  Medical History: Past Medical History  Diagnosis Date  . Myocardial infarction 2 yrs ago  . Hypertension   . Diabetes mellitus   . Coronary artery disease   . Heart disease   . Hypercholesteremia     Medications:  Scheduled:    . gi cocktail  30 mL Oral Once  . heparin  4,000 Units Intravenous Once  .  morphine injection  4 mg Intravenous Once  . ondansetron  4 mg Oral Once   Infusions:    . heparin        Thank you for the consult  Janace Litten, PharmD [phone] 10/17/2011,12:23 PM

## 2011-10-17 NOTE — ED Notes (Signed)
Pt ambulated to the bathroom with no assistance, difficulty or pain

## 2011-10-17 NOTE — ED Notes (Signed)
Rhea Bleacher  PA shown results of Trop I poc. ED-lab.

## 2011-10-17 NOTE — ED Notes (Signed)
Pt reports epigastric chest pain onset Friday after eating. Pt denies N/V or diaphoresis.

## 2011-10-17 NOTE — ED Notes (Signed)
Pt brought back from triage via wheelchair with EKG already performed; pt undressed, in gown, on monitor, continuous pulse oximetry and blood pressure cuff; blanket given; vitals retaken

## 2011-10-17 NOTE — ED Notes (Signed)
MD at bedside. 

## 2011-10-18 ENCOUNTER — Encounter (HOSPITAL_COMMUNITY): Payer: Self-pay | Admitting: Cardiology

## 2011-10-18 ENCOUNTER — Encounter (HOSPITAL_COMMUNITY)
Admission: EM | Disposition: A | Discharge status: Home / Self Care | Payer: Self-pay | Source: Ambulatory Visit | Attending: Cardiology

## 2011-10-18 ENCOUNTER — Other Ambulatory Visit: Payer: Self-pay

## 2011-10-18 DIAGNOSIS — I214 Non-ST elevation (NSTEMI) myocardial infarction: Secondary | ICD-10-CM | POA: Diagnosis present

## 2011-10-18 DIAGNOSIS — Z955 Presence of coronary angioplasty implant and graft: Secondary | ICD-10-CM

## 2011-10-18 HISTORY — PX: LEFT HEART CATHETERIZATION WITH CORONARY ANGIOGRAM: SHX5451

## 2011-10-18 LAB — BASIC METABOLIC PANEL
BUN: 13 mg/dL (ref 6–23)
CO2: 28 mEq/L (ref 19–32)
Calcium: 8.5 mg/dL (ref 8.4–10.5)
Chloride: 107 mEq/L (ref 96–112)
Creatinine, Ser: 0.83 mg/dL (ref 0.50–1.35)
GFR calc Af Amer: >90 mL/min (ref >90)
GFR calc non Af Amer: >90 mL/min (ref >90)
Glucose, Bld: 97 mg/dL (ref 70–99)
Potassium: 3.9 mEq/L (ref 3.5–5.1)
Sodium: 143 mEq/L (ref 135–145)

## 2011-10-18 LAB — PROTIME-INR: INR: 1.02 (ref 0.00–1.49)

## 2011-10-18 LAB — CBC
HCT: 40.3 % (ref 39.0–52.0)
Hemoglobin: 13.6 g/dL (ref 13.0–17.0)
RBC: 4.99 MIL/uL (ref 4.22–5.81)
RDW: 13.5 % (ref 11.5–15.5)
WBC: 8.1 10*3/uL (ref 4.0–10.5)

## 2011-10-18 LAB — GLUCOSE, CAPILLARY
Glucose-Capillary: 106 mg/dL — ABNORMAL HIGH (ref 70–99)
Glucose-Capillary: 105 mg/dL — ABNORMAL HIGH (ref 70–99)
Glucose-Capillary: 241 mg/dL — ABNORMAL HIGH (ref 70–99)

## 2011-10-18 LAB — CARDIAC PANEL(CRET KIN+CKTOT+MB+TROPI): Total CK: 321 U/L — ABNORMAL HIGH (ref 7–232)

## 2011-10-18 LAB — PLATELET INHIBITION P2Y12: Platelet Function  P2Y12: 173 [PRU] — ABNORMAL LOW (ref 194–418)

## 2011-10-18 LAB — HEPARIN LEVEL (UNFRACTIONATED): Heparin Unfractionated: 0.12 IU/mL — ABNORMAL LOW (ref 0.30–0.70)

## 2011-10-18 LAB — POCT ACTIVATED CLOTTING TIME: Activated Clotting Time: 578 seconds

## 2011-10-18 SURGERY — LEFT HEART CATHETERIZATION WITH CORONARY ANGIOGRAM
Anesthesia: LOCAL

## 2011-10-18 MED ORDER — FENTANYL CITRATE 0.05 MG/ML IJ SOLN
INTRAMUSCULAR | Status: AC
  Filled 2011-10-18: qty 2

## 2011-10-18 MED ORDER — HEPARIN (PORCINE) IN NACL 2-0.9 UNIT/ML-% IJ SOLN
INTRAMUSCULAR | Status: AC
  Filled 2011-10-18: qty 2000

## 2011-10-18 MED ORDER — METOPROLOL TARTRATE 50 MG PO TABS
50.0000 mg | ORAL_TABLET | Freq: Two times a day (BID) | ORAL | Status: DC
  Administered 2011-10-18 – 2011-10-19 (×2): 50 mg via ORAL
  Filled 2011-10-18 (×3): qty 1

## 2011-10-18 MED ORDER — DIAZEPAM 5 MG PO TABS: 5.0000 mg | ORAL_TABLET | Freq: Once | ORAL | Status: AC

## 2011-10-18 MED ORDER — LIDOCAINE HCL (PF) 1 % IJ SOLN
INTRAMUSCULAR | Status: AC
  Filled 2011-10-18: qty 30

## 2011-10-18 MED ORDER — SODIUM CHLORIDE 0.9 % IV SOLN
INTRAVENOUS | Status: AC
  Administered 2011-10-18: 17:00:00 via INTRAVENOUS

## 2011-10-18 MED ORDER — BIVALIRUDIN 250 MG IV SOLR
INTRAVENOUS | Status: AC
  Filled 2011-10-18: qty 250

## 2011-10-18 MED ORDER — DIPHENHYDRAMINE HCL 50 MG/ML IJ SOLN
INTRAMUSCULAR | Status: AC
  Filled 2011-10-18: qty 1

## 2011-10-18 MED ORDER — DIAZEPAM 5 MG PO TABS
ORAL_TABLET | ORAL | Status: AC
  Administered 2011-10-18: 5 mg
  Filled 2011-10-18: qty 1

## 2011-10-18 MED ORDER — FAMOTIDINE IN NACL 20-0.9 MG/50ML-% IV SOLN
INTRAVENOUS | Status: AC
  Filled 2011-10-18: qty 50

## 2011-10-18 MED ORDER — HEPARIN SODIUM (PORCINE) 1000 UNIT/ML IJ SOLN
INTRAMUSCULAR | Status: AC
  Filled 2011-10-18: qty 1

## 2011-10-18 MED ORDER — MORPHINE SULFATE 2 MG/ML IJ SOLN: 1.0000 mg | INTRAMUSCULAR | Status: DC | PRN

## 2011-10-18 MED ORDER — OXYCODONE-ACETAMINOPHEN 5-325 MG PO TABS
1.0000 | ORAL_TABLET | ORAL | Status: DC | PRN
  Administered 2011-10-18 – 2011-10-19 (×4): 2 via ORAL
  Filled 2011-10-18 (×4): qty 2

## 2011-10-18 MED ORDER — METHYLPREDNISOLONE SODIUM SUCC 125 MG IJ SOLR
INTRAMUSCULAR | Status: AC
  Filled 2011-10-18: qty 2

## 2011-10-18 MED ORDER — HEPARIN BOLUS VIA INFUSION
3000.0000 [IU] | Freq: Once | INTRAVENOUS | Status: AC
  Administered 2011-10-18: 3000 [IU] via INTRAVENOUS
  Filled 2011-10-18: qty 3000

## 2011-10-18 MED ORDER — NITROGLYCERIN 0.2 MG/ML ON CALL CATH LAB
INTRAVENOUS | Status: AC
  Filled 2011-10-18: qty 1

## 2011-10-18 MED ORDER — DIAZEPAM 5 MG PO TABS
10.0000 mg | ORAL_TABLET | Freq: Once | ORAL | Status: AC
  Administered 2011-10-18: 10 mg via ORAL
  Filled 2011-10-18: qty 2

## 2011-10-18 MED ORDER — MIDAZOLAM HCL 2 MG/2ML IJ SOLN
INTRAMUSCULAR | Status: AC
  Filled 2011-10-18: qty 2

## 2011-10-18 MED ORDER — VERAPAMIL HCL 2.5 MG/ML IV SOLN
INTRAVENOUS | Status: AC
  Filled 2011-10-18: qty 2

## 2011-10-18 NOTE — Progress Notes (Signed)
Pharmacy: Heparin follow up post cath  Spoke with Nada Boozer regarding active orders for heparin and heparin per pharmacy post cath with bivalirudin and stent placement.  Received order to discontinue heparin.  Lovenia Kim Pharm.D., BCPS 10/18/2011 6:24 PM [phone]

## 2011-10-18 NOTE — Op Note (Signed)
[facility]     CARDIAC CATHETERIZATION & PERCUTANEOUS CORONARY INTERVENTION REPORT  NAME: Andrew West   MRN: [id number] DOB: 1966-03-12   ADMIT DATE:  10/17/2011  Performing Cardiologist: Andrew West, M.D, M.S. Primary Physician: Andrew Courier, MD Primary Cardiologist:  Andrew West. Ave Filter, M.D.  Procedures Performed:  Left Heart Catheterization via 6 Fr Right Radial Artery access  IC NTG Injection - 6 total injections of 200 mcg each during the various stages of the intervention procedure  Complex Bifurcation Percutaneous coronary intervention with Cutting Balloon Angioplasty of the bifurcation between stented large Lateral OM2 and the atrio-ventricular groove circumflex artery: 2.75 mm x 10 mm Flextome cutting balloon -- 90% in-stent restenosis/thrombosis a recently placed to 2.25 mm mini vision stent, 2.5 mm x 10 mm Flextome Cutting Balloon -- 90% stenotic jailed AV groove circumflex perfusing a left dominant system.  Indication(s): Non-ST elevation MI; Troponin 2.7    Known Coronary Artery Disease status post multiple PCI's to mid and distal circumflex, lateral OM, proximal and distal ramus intermedius  Status Post Recent bare metal stent placement from mid circumflex into lateral OM (called PDA in last cath report); 2.42mm x 18 mm mini vision bare metal stent to lateral OM postdilated with 2.75 balloon, extensive dilation of the patient's previously stated mid circumflex and the proximal portion with up to 3.25 mm noncompliant balloon.  History: 45 y.o. male was recently admitted for chest pain and received PTCA/stent: 2nd obtuse marginal treatment,and PTCA to mid L Cix. Coronary Artery Disease and Cardiovascular risk analysis - diabetic existing CAD, hypertension, hyperlipidemia presenting with angina ruled in for non-ST elevation MI.  Andrew West has had 2 interventions dating back to 2002 he had stent placement in the distal atrioventricular  groove circumflex, this was followed by 3 successive interventions to the mid circumflex, lateral OM (called OM1), and 2 spots in the proximal and distal ramus intermedius.  He had done well until October of this year when he presented with chest pain and was noted to have diffuse disease in the circumflex artery stenting into the lateral OM as well as total occlusion of the ramus intermedius. Prior to that he was admitted for chest pain ruled out for MI, but does not have positive for ischemia in the inferolateral left ventricle. Next He states he's been taking all his medications according to prescription, and he and his usual state of health until early this past week when he began to have nagging chest discomfort.  These symptoms continued to be progressive until Sunday morning when they persisted and not go away. He went to the emergency room for evaluation, where he was found to have positive troponins consistent with non-ST elevation MI. He was treated medically with IV nitroglycerin and heparin and has remained chest pain-free since arrival. He is now referred for diagnostic invasive coronary evaluation via catheterization with possible percutaneous intervention.  Consent: The procedure with Risks/Benefits/Alternatives and Indications was reviewed with the patient (and family).  All questions were answered.    Risks / Complications include, but not limited to: Death, MI, CVA/TIA, VF/VT (with defibrillation), Bradycardia (need for temporary pacer placement), contrast induced nephropathy, bleeding / bruising / hematoma / pseudoaneurysm, vascular or coronary injury (with possible emergent CT or Vascular Surgery), adverse medication reactions, infection.    The patient (and family) voice understanding and agree to proceed.    Risks of procedure as well as the alternatives and risks of each were explained to the (patient/caregiver).  Consent for procedure obtained. Consent for signed by MD and patient  with RN witness -- placed on chart.  Procedure: The patient was brought to the 2nd Floor [facility] in the fasting state and prepped and draped in the usual sterile fashion for Right radial access. A modified Allen's test with plethysmography was performed on the right wrist demonstrating adequate Ulnar Artery collateral flow).    Sterile technique was used including antiseptics, cap, gloves, gown, hand hygiene, mask and sheet.  Skin prep: Chlorhexidine;  Time Out: Verified patient identification, verified procedure, site/side was marked, verified correct patient position, special equipment/implants available, medications/allergies/relevent history reviewed, required imaging and test results available.  Performed  The right wrist was anesthetized with 1% subcutaneous Lidocaine.  The right radial artery was accessed using the Seldinger Technique with placement of a 6 Fr Glide Sheath. The sheath was aspirated and flushed.  Then a total of 10 ml of standard Radial Artery Cocktail (see medications) was infused.  Radial Cocktail: 5 mg Verapamil, 400 mcg NTG, 2 ml 2% Lidocaine.  P2Y12 platelet inhibition assay sample obtained and sent to lab. Intravenous heparin 5500 units was administered.  A 5 Fr TIG 4.0 Catheter was advanced of over a Lexicographer J wire into the ascending Aorta.  The catheter was used to engage the Left and Right coronary artery.  Multiple cineangiographic views of both the Left and Right coronary artery system(s) were performed.  IC NTG injection was administered prior to LCA angiography.  This catheter was then advanced across the Aortic Valve over a wire.  LV hemodynamics were measured.   Left Ventriculography was not performed as the catheter was directed such that an intramyocardial injection was likely.  LV hemodynamics were then re-sampled, and the catheter was pulled back across the Aortic Valve for measurement of "pull-back" gradient.  The  catheter was removed completely out of the body over the [employer] J wire.  Hemodynamics:  Central Aortic Pressure: 148/98 mmHg  LV Pressure / LV End diastolic Pressure: 148/20 mmHg; 18 mmHg  Coronary Angiographic Data:  Left Coronary dominant  Left Main:  Long large caliber, angiographically normal  Left Anterior Descending (LAD):  Large-caliber diffuse mild luminal irregularities, large septal perforator-bifurcating, large branching D1. Proximal 30-40% tubular stenosis, mid 30-40%, distal 60-70%. The LAD wraps around the apex. D1 gives collaterals to an RV marginal branch  1st diagonal (D1):  Moderate caliber vessel, bifurcating, no angiographic evidence of significant  disease  Circumflex (LCx):  Moderate to large caliber, dominant vessel; the proximal and midportion of the vessel had diffuse mild luminal irregularities, the previously placed stents in the mid vessel prior to the lateral OM (called OM2 for purposes of this report, called left PDA in the last report, called OM1 in previous reports)  have mild in-stent restenosis, this is in the recently ballooned area.  Small 1st obtuse marginal:  Small caliber, diffusely disased (previously not called OM1); also a moderate atrial branch coursing to the right side of the heart  At the bifurcation of the AV groove circumflex and the lateral OM 1 is a hazy filling defect involving both branches, this is consistent with in-stent restenosis with thrombus.  Lateral 2nd obtuse marginal (OM1):  Moderate caliber, ostial 90% hazy/thrombotic in-stent restenosis; distal flow is TIMI 2 in a diffusely diseased vessel.   AV Groove Circumflex Artery: Ostial/bifurcation 90% stenosis, this is in a jailed portion of this vessel. The follow-on vessel has a patent stent with  diffuse disease; TIMI 2 flow distal  Several small posterior lateral branch:  First brach- small caliber views disease, second branch small caliber with ostial 60-70%  lesion.  Left PDA:  Small to moderate caliber, diffusely diseased  Ramus Intermedius:  Proximal stent 100% occluded with trace distal runoff into what used to be a large branching vessel reaching the apex. The distal stent there is not visible.  Right Coronary Artery: Small nondominant, 100% occluded (known to be occluded since 2003)  PERCUTANEOUS CORONARY INTERVENTION PROCEDURE  After reviewing the initial cineangiography images, the culprit lesion(s) at the bifurcation of the AV groove circumflex and lateral OM 1 was identified, and the decision was made to proceed with percutaneous coronary intervention with cutting balloon angioplasty. The plan was discussed with the patient. Additional sedation was administered.    A weight based bolus of IV Angiomax was administered and the drip was continued until completion of the procedure, and continued for one hour post procedure.   Oral Clopidogrel 75 mg had been administered this morning. He has been on clopidogrel daily since his last intervention. Prior to initiating the procedure, blood sample for P2Y12 assay was sent to the lab.  The Guide catheter(s) were advanced over long-exchange J-wire and used to engage the left Coronary Artery.  Initially an XB 3.5 guide catheter was used but was unsuccessful to engage the left coronary artery. This was exchanged for a VODA left guide catheter was successfully used to engage the left coronary artery. An ACT of > 200 Sec was confirmed prior to advancing the Guidewires. As this is a bifurcation intervention, 2 guidewires were advanced with a BMW in the lateral OM 1 and a Prowater in the AV groove circumflex, through the existing stent. With both wires in place, temperature made to pass several balloons through the recently placed stent across the bifurcation into the distal AV groove circumflex (initially an Emerge 2.0 mm 12 mm balloon was attempted to cross unsuccessfully, next a mini trek 1.2 mm x 12 mm  balloon was also in crossing ) A low pressure inflation of the 1.2 mm balloon at the presumed bifurcation site did not allow for passive and balloon. The wire was then pulled back and readvanced to occasions successfully back down the AV groove circumflex, however, no balloon able to pass the stent.  Attention was then turned to the OM branch which was predilated with the Emerge 2.0 mm x 12 mm balloon which restored TIMI-3 flow down the distal OM. Attempts were then made to recross the stent into the AV groove , however they were unsuccessful. For the last attempt balloon advancement led to losing guide engagement and both wires were pullback into the aorta. At this time guide catheter was then reengaged into the left coronary artery. The Prowater wire was then advanced into lateral OM 1. The bifurcation segment was then dilated with a 2.75 mm x 10 mm Flextome cutting balloon as detailed below. The balloon was then removed out of the body, and the guidewire surreptitiously was pulled back just to the ostium of the AV groove circumflex. The wire was then advanced easily into the AV groove circumflex and a loop configuration, therefore decision was made to attempt to recross the recently placed stent into the AV groove circumflex. First the mini trek 1.2 mm x 12 mm balloon was successfully advanced across the stent and inflated, next an Emerge 2.12mm x 12 mm balloon followed by a Flextome 2.5 mm x 10 mm balloon which  was then used to successfully performing angioplasty on the jailed AV groove circumflex providing TIMI-3 flow into the distal dominant circumflex branches. This is a complex difficult seizure due to this being a bifurcation lesion intervention requiring to guidewires and multiple balloon exchanges to cross the recently placed stent overlapping the previously placed stents. See detailed inflations below:   Lesion #1:  Ostial lateral OM 1  Pre-PCI Stenosis: 90% Post-PCI Stenosis: 0 %     TIMI 2 flow        TIMI 3 flow  Guide Catheter: 6 French VL 3.5  Guidewire: BMW, then Prowater  Pre-Dilitation Balloon: Emerge 2.0 mm x 12 mm; compliant balloon   1st Inflation:    14 Atm for 50 Sec Scout angiography did not reveal evidence of dissection or perforation, TIMI 3 flow restored, but still unable to advance balloon into AVG Circ. IC NTG BMW wire removed, Prowater wire advanced.  Cutting Balloon:  Flextome 2.75 mm x 10 mm   1st Inflation:    6 Atm for  90 Sec   2nd Inflation:  12 Atm for  90 Sec   Scout angiography did not reveal evidence of dissection or perforation, AT this point the Prowater wire was redirected into the AVG Cx. IC NTG  Lesion # 2:  Bifurcation AV groove circumflex /lateral OM 1  Pre-PCI Stenosis:  90 % Post-PCI Stenosis:  0 %     TIMI  2 flow       TIMI  3 flow  Guidewire:  Prowater  Pre-Dilitation Balloon #1:  Mini trek 1.2 mm x 12 mm   1st Inflation:    16 Atm for  45 Sec Scout angiographydid not reveal evidence of dissection or perforation, TIMI 3 flow restored; IC NTG Pre-Dilitation Balloon #2:  Emerge 2.0 mm x  12 mm, compliant balloon   1st Inflation:    12 Atm for  40 Sec  Cutting Balloon:  Flextome 2.5 mm x 10 mm   1st Inflation:    12 Atm for  93 Sec   2nd Inflation:  14 Atm for  90 Sec  Post angioplasty angiography revealed excellent TIMI-3 flow downstream in both vessels system with no evidence of dissection or perforation. Angiography was performed with and without wire and placed and pre-and post intracoronary nitroglycerin injection. The balloon and wire were removed completely out of the body, and the guide catheter was removed to be out of the body over the safety J-wire.  The sheath was removed in the Cath Lab with a TR band placed at 13 ml Air at 1615 hrs (time).  Reverse Allen's test with plethysmography revealed non-occlusive hemostasis.  The patient was transported to the  holding area in  Stable condition.   The patient  was stable before, during and  following the procedure.   Patient did tolerate procedure well. There were not complications. EBL: <30 ml  Medications:  Premedication:  5 mg  IV Valium,  *(25mg   IV Benadryl,  125 mg IV Solumedrol, 20 mg IV famotidine -- for reported IV contrast hypersensitivity)   Sedation:  6 mg IV Versed,  125 IV mcg Fentanyl  Contrast:  300 Omnipaque  Angiomax bolus and drip weight-based - continued until patient transported to holding area  Heparin 5500 units IV at times sheath placement Radial Cocktail: 5 mg Verapamil, 400 mcg NTG, 2 ml 2% Lidocaine   Impression: 1.  Severe in-stent restenosis/thrombosis of recently placed bare-metal stent crossing from the mid circumflex into  the lateral OM jailing the AV groove circumflex (dominant) with involvement of the AV groove circumflex. 2.  Successful bifurcation lesion cutting balloon atherectomy of the in-stent restenosis/thrombosis involving the bifurcation of the AV groove circumflex and lateral OM 1 with a 2.75 mm x 10 mm cutting balloon for the recently placed stent and a 2.5 mm x 10 mm cutting balloon through the stent into the AV groove circumflex -- restoring TIMI-3 flow down both distal branches.  3.  Subtotal Occlusion of the ramus intermedius proximal stent with minimal/trace flow distally into what used to be a large caliber branching vessel with a distal stent. 4.  Known occlusion of a nondominant right coronary artery, and diffuse moderate disease in a large wraparound LAD system.     Plan: 1.  Continued with dual antiplatelet therapy (aspirin plus Clopidogrel pending platelet inhibition assay results ) 2.   Continue to aggressively titrate cardiac medications. 3.   The patient will return to the step down unit for ongoing post-cath care. Anticipate discharge in the morning if remains stable. 4.   Consider initiating long-term SSRI/SSNRI for anxiety. 5.  He will follow up with Dr. Rennis Golden in one to weeks.  The case and results was  discussed with the patient (and family). The case and results was not discussed with the patient's PCP. The case and results was discussed with the patient's Cardiologist.  Time Spend Directly with Patient:  3hr 30 minutes  Manreet Kiernan W, M.D., M.S. [facility] [address] [place], [place]  [address]  [phone]  10/18/2011 7:07 PM

## 2011-10-18 NOTE — Progress Notes (Signed)
Charmian Muff PA notified of increasing CKMB and Trop. Pt.denies and chest pain or dyscomfort.

## 2011-10-18 NOTE — Progress Notes (Signed)
NAME:  [name]   MRN: [id number] DOB:  1966/03/13   ADMIT DATE: 10/17/2011  CARDIAC CATH CONSENT NOTE See Progress note dated today for Updated H&P  Performing MD:  Marykay Lex, MD,MS  Procedure: LEFT HEART CATHETERIZATION, LEFT VENTRICULOGRAPHY, CORONARY ANGIOGRAPHY; POSSIBLE PERCUTANEOUS CORONARY INTERVENTION (PTCA, +/- STENT)  The procedure with Risks/Benefits/Alternatives and Indications was reviewed with the patient AND family.  All questions were answered.    Risks / Complications include, but not limited to: Death, MI, CVA/TIA, VF/VT (with defibrillation), Bradycardia (need for temporary pacer placement), contrast induced nephropathy, bleeding / bruising / hematoma / pseudoaneurysm, vascular or coronary injury (with possible emergent CT or Vascular Surgery), adverse medication reactions, infection.    The patient (and family) voiced understanding and agree to proceed.   I have signed the consent form and placed it on the chart for patient signature and RN witness.     Marykay Lex, M.D, M.S. [facility] [address] [place], [place]  [address]  [phone]  10/18/2011 9:45 AM

## 2011-10-18 NOTE — Brief Op Note (Signed)
NAME:  FAOLAN SPRINGFIELD   MRN: [id number] DOB:  [birth year]   ADMIT DATE: 10/17/2011  BRIEF PCI NOTE  PATIENT:  HOLTEN SPANO is a 45 y.o. male with a history of known CAD - occluded RCA, Severe disease in Dominant LCx, near occluded Ramus, diffuse disease in LAD.  He presented 10/17/11 with stuttering anginal chest pain, ruled in for NSTEMI.   Given his recent intervention, he is referred for invasive coronary evaluation +/- PCI.  PRE-OPERATIVE DIAGNOSIS:  NSTEMI, Recent BMS x2 to mCx & OM2 - jailing AVG Cx  POST-OPERATIVE DIAGNOSIS:    90% in-stent re-stenosis (thrombotosis) of Recently palced BMS to OM2 involving bifurcation of OM2 & AVG Cx with ~90% ostial AVG Circ after OM2 Stent  S/p Successful bifurcation Percutaneous Coronary Balloon Angioplasty (Cutting Balloon); 2.5 mm x 10 mm Flextome Cutting Balloon for AVG Cx through OM2 Stent, 2.75 mm x 10 mm  Flextome Cutting Balloon for mCx-OM2 (instent)  PROCEDURES:   LEFT HEART CATHETERIZATION (W/O LEFT VENTRICULOGRAPHY) - via 6Fr Right Radial Artery Access. NATIVE CORONARY ANGIOGRAPHY,  INTRA-CORONARY NITROGLYCERIN INJECTION COMPLEX BIFURCATION PERCUTANEOUS CORONARY STENT INTERVENTION - Cutting Balloon Atherectomy of mid to AVG Cx / OM2 bifurcation.  SURGEON:  Surgeon(s): Marykay Lex, MD, MS  ASSISTANTS: Lauro Regulus, RN, RRT  ANESTHESIA:   local and moderate sedation  EBL:   <70ml  BLOOD ADMINISTERED:none  PREMEDICATIONS: 125 mg IV Solumedrol, 25 mg IV Benadryl, 20 mg IV Famotidine LOCAL & SEDATION MEDICATIONS USED:  LIDOCAINE 20 ml; 6mg  IV Versed, 125 mcg IV Fentanyl.  SPECIMEN:  No Specimen  DISPOSITION OF SPECIMEN:  N/A  COUNTS:  NO Not required  TOURNIQUET:  TR Band 13ml air; 1615  DICTATION: .Note written in EPIC  PATIENT DISPOSITION:  Post-procedure Stepdown unit, 2900; Stable  PLAN OF CARE: Monitor overnight, Anticipate d/c in AM  TIME WITH PATENT:  3.5 hours  Pierce Biagini W, M.D., M.S. [facility] [address] [place], [place]  [address]  [phone]  10/18/2011 4:40 PM

## 2011-10-18 NOTE — Progress Notes (Signed)
ANTICOAGULATION CONSULT NOTE - Follow up Consult  Pharmacy Consult for Heparin Indication: chest pain/ACS    Allergies  Allergen Reactions  . Iohexol      Desc: PT BECAME SOB AND CHEST TIGHTNESS AFTER CONTRAST INJECTION.  [name],RT-RCT., Onset Date: [id number]     Patient Measurements: Height: 5\' 8"  (172.7 cm) Weight: 270 lb 8.1 oz (122.7 kg) IBW/kg (Calculated) : 68.4  Adjusted Body Weight: 91.6kg  Vital Signs: Temp: 98.8 F (37.1 C) (11/26 0400) Temp src: Oral (11/26 0400) BP: 106/73 mmHg (11/26 0500) Pulse Rate: 83  (11/25 2156)  Labs:  Basename 10/18/11 0328 10/18/11 0017 10/17/11 1836 10/17/11 1834 10/17/11 1021  HGB 13.6 -- -- -- 14.5  HCT 40.3 -- -- -- 42.6  PLT 243 -- -- -- 233  APTT -- -- -- -- --  LABPROT 13.6 -- -- -- --  INR 1.02 -- -- -- --  HEPARINUNFRC 0.12* -- -- <0.10* --  CREATININE 0.83 -- -- -- 0.77  CKTOTAL -- 321* 371* -- --  CKMB -- 10.6* 12.2* -- --  TROPONINI -- 1.90* 2.07* -- --   Estimated Creatinine Clearance: 143.2 ml/min (by C-G formula based on Cr of 0.83).  Medical History: Past Medical History  Diagnosis Date  . Myocardial infarction 2 yrs ago  . Hypertension   . Diabetes mellitus   . Heart disease   . Hypercholesteremia   . Coronary artery disease   . Angina     Medications:  Scheduled:     . aspirin  324 mg Oral Pre-Cath  . aspirin EC  325 mg Oral Daily  . clopidogrel  75 mg Oral Q breakfast  . diazepam  5 mg Oral Pre-Cath  . gi cocktail  30 mL Oral Once  . heparin  3,000 Units Intravenous NOW  . heparin  4,000 Units Intravenous Once  . metoprolol tartrate  50 mg Oral BID  .  morphine injection  4 mg Intravenous Once  .  morphine injection  4 mg Intravenous Once  . ondansetron  4 mg Oral Once  . pantoprazole  20 mg Oral Daily  . pneumococcal 23 valent vaccine  0.5 mL Intramuscular Tomorrow-1000  . simvastatin  20 mg Oral q1800  . DISCONTD: aspirin  325 mg Oral Daily  . DISCONTD: metoprolol  150 mg  Oral BID   Infusions:     . sodium chloride 1.004 mL/kg/hr (10/18/11 0510)  . heparin 1,550 Units/hr (10/18/11 0250)  . nitroGLYCERIN 5 mcg/min (10/17/11 1800)  . DISCONTD: heparin 1,200 Units/hr (10/17/11 1412)   Assessment: Delay in lab reporting 0300 level. Verbal report from lab = 0.12  No bleeding reported.  Patient weighs 122.7  kg,  BMI =41.  no complications with IV heparin infusion.   Goal of Therapy:  Heparin level 0.3-0.7 units/ml   Plan:  -Heparin Bolus 3000 units now x1.  -Increase  heparin gtt at 2000 units/hr - check 6 hr heparin level -      =  Janice Coffin, PharmD [phone] 10/18/2011,6:54 AM

## 2011-10-18 NOTE — Progress Notes (Signed)
45 year old black male with history of coronary artery disease (h/o MI ~2 y ago), in-stent restenosis to an obtuse marginal with a new obtuse marginal site intervened in Oct 2012 with Multilink Vision stents, known coronary artery disease with previous percutaneous coronary intervention in 2004. He now presents with stuttering Angina - noted to have positive troponins c/w NSTEMI.  Subjective: Anxious about cardiac cath.  No further CP or SOB.  Objective: Vital signs in last 24 hours: Temp:  [97.5 F (36.4 C)-98.8 F (37.1 C)] 98.1 F (36.7 C) (11/26 0744) Pulse Rate:  [61-83] 66  (11/26 0744) Resp:  [17-22] 20  (11/26 0400) BP: (103-159)/(64-107) 122/101 mmHg (11/26 0700) SpO2:  [93 %-100 %] 96 % (11/26 0744) Weight:  [106.595 kg (235 lb)-122.7 kg (270 lb 8.1 oz)] 270 lb 8.1 oz (122.7 kg) (11/25 1755) Weight change:  Last BM Date: 10/16/11 Intake/Output from previous day: 11/25 0701 - 11/26 0700 In: 991.6 [P.O.:360; I.V.:631.6] Out: -  Intake/Output this shift:    PE:  General appearance: alert, cooperative, no distress and very anxious Neck: no adenopathy, no carotid bruit, no JVD, supple, symmetrical, trachea midline and thyroid not enlarged, symmetric, no tenderness/mass/nodules Lungs: clear to auscultation bilaterally and non-labored Heart: regular rate and rhythm, S1, S2 normal, no murmur, click, rub or gallop Abdomen: soft, non-tender; bowel sounds normal; no masses,  no organomegaly Extremities: extremities normal, atraumatic, no cyanosis or edema Pulses: 2+ and symmetric R radial pulse bounding. Skin: Skin color, texture, turgor normal. No rashes or lesions Neurologic: Alert and oriented X 3, normal strength and tone. Normal symmetric reflexes. Normal coordination and gait Mallampati:3   Lab Results:  Basename 10/18/11 0328 10/17/11 1021  WBC 8.1 8.7  HGB 13.6 14.5  HCT 40.3 42.6  PLT 243 233   BMET  Basename 10/18/11 0328 10/17/11 1021  NA 143 139  K 3.9 3.7   CL 107 103  CO2 28 26  GLUCOSE 97 110*  BUN 13 10  CREATININE 0.83 0.77  CALCIUM 8.5 9.0    Basename 10/18/11 0017 10/17/11 1836  TROPONINI 1.90* 2.07*    Lab Results  Component Value Date   CHOL  Value: 112        ATP III CLASSIFICATION:  <200     mg/dL   Desirable  161-096  mg/dL   Borderline High  >=045    mg/dL   High        02/28/8118   HDL 20* 06/03/2010   LDLCALC  Value: 39        Total Cholesterol/HDL:CHD Risk Coronary Heart Disease Risk Table                     Men   Women  1/2 Average Risk   3.4   3.3  Average Risk       5.0   4.4  2 X Average Risk   9.6   7.1  3 X Average Risk  23.4   11.0        Use the calculated Patient Ratio above and the CHD Risk Table to determine the patient's CHD Risk.        ATP III CLASSIFICATION (LDL):  <100     mg/dL   Optimal  147-829  mg/dL   Near or Above                    Optimal  130-159  mg/dL   Borderline  562-130  mg/dL   High  >  190     mg/dL   Very High 1/61/0960   TRIG 267* 06/03/2010   CHOLHDL 5.6 06/03/2010   Lab Results  Component Value Date   HGBA1C 6.0* 07/27/2011       Hepatic Function Panel No results found for this basename: PROT,ALBUMIN,AST,ALT,ALKPHOS,BILITOT,BILIDIR,IBILI in the last 72 hours No results found for this basename: CHOL in the last 72 hours No results found for this basename: PROTIME in the last 72 hours    EKG: Orders placed during the hospital encounter of 10/17/11  . EKG 12-LEAD  . EKG 12-LEAD    Studies/Results: Dg Chest 2 View  10/17/2011  *RADIOLOGY REPORT*  Clinical Data: Chest pain, cough  CHEST - 2 VIEW  Comparison: 08/30/2011  Findings: Some increase in central pulmonary vascular congestion. No confluent airspace infiltrate or overt interstitial edema.  No effusion.  Heart size upper limits normal.  Regional bones unremarkable.  Coronary stent noted.  IMPRESSION:  1.  Interval increase in central pulmonary vascular congestion.  Original Report Authenticated By: Osa Craver, M.D.     Medications: I have reviewed the patient's current medications.  Assessment/Plan:  Patient Active Problem List  Diagnoses  . DM  . HYPERCHOLESTEROLEMIA  . HYPERLIPIDEMIA  . OBESITY  . TOBACCO ABUSE  . DEPRESSION  . PERIPHERAL NEUROPATHY  . HYPERTENSION  . MYOCARDIAL INFARCTION, HX OF  . CAD (coronary artery disease), native coronary artery  . ALLERGIC RHINITIS  . GERD  . RECTAL BLEEDING  . HEMATOCHEZIA  . GROSS HEMATURIA  . CERVICAL RADICULOPATHY, LEFT  . BACK PAIN  . CHEST PAIN  . ABDOMINAL PAIN, LEFT LOWER QUADRANT  . ABDOMINAL PAIN, EPIGASTRIC  . COLONIC POLYPS, HX OF  . ANAL FISSURE, HX OF  . NSTEMI (non-ST elevated myocardial infarction)    PLAN:  NSTEMI this admit with pk. tropI 2.07.  CAD with totaled RCA, recent Bare metal stents 08/2011 to the mid av groove and PDA of LCX Plan for cardiac cath today, MD will discuss with patient before the case begins. Pt. With anxiety and depression, ? Begin SSRI this admit?   LOS: 1 day   INGOLD,LAURA R 10/18/2011, 8:40 AM  I have seen and examined the patient along with Nada Boozer, FNP.  I have reviewed the chart, notes and new data.  I agree with Laura's note.  Key new complaints: Very anxious, no CP, SOB Key examination changes: normal exam Key new findings / data: troponins (2.07) noted  PLAN: LCP today +/- PCI - R radial approach. Will pre-medicate with Valium (old films reviewed) Continue DAP, Statin & BB. May well need SSRI on d/c for anxiety.  Marykay Lex, MD, MS [facility] [address] [place], [place]  [address]  [phone]  10/18/2011 9:40 AM

## 2011-10-19 ENCOUNTER — Other Ambulatory Visit: Payer: Self-pay

## 2011-10-19 LAB — GLUCOSE, CAPILLARY
Glucose-Capillary: 188 mg/dL — ABNORMAL HIGH (ref 70–99)
Glucose-Capillary: 199 mg/dL — ABNORMAL HIGH (ref 70–99)

## 2011-10-19 LAB — BASIC METABOLIC PANEL
BUN: 10 mg/dL (ref 6–23)
CO2: 21 mEq/L (ref 19–32)
Chloride: 105 mEq/L (ref 96–112)
Creatinine, Ser: 0.72 mg/dL (ref 0.50–1.35)
Glucose, Bld: 126 mg/dL — ABNORMAL HIGH (ref 70–99)

## 2011-10-19 LAB — PLATELET INHIBITION P2Y12: Platelet Function  P2Y12: 167 [PRU] — ABNORMAL LOW (ref 194–418)

## 2011-10-19 LAB — CBC
HCT: 41.3 % (ref 39.0–52.0)
Hemoglobin: 13.5 g/dL (ref 13.0–17.0)
MCH: 26.4 pg (ref 26.0–34.0)
RBC: 5.12 MIL/uL (ref 4.22–5.81)

## 2011-10-19 MED ORDER — ZOLPIDEM TARTRATE 5 MG PO TABS: 10.0000 mg | ORAL_TABLET | Freq: Every evening | ORAL | Status: DC | PRN

## 2011-10-19 MED ORDER — METFORMIN HCL 1000 MG PO TABS: 1000.0000 mg | ORAL_TABLET | Freq: Two times a day (BID) | ORAL | Status: DC

## 2011-10-19 MED ORDER — ACETAMINOPHEN 325 MG PO TABS: 650.0000 mg | ORAL_TABLET | ORAL | Status: AC | PRN

## 2011-10-19 MED ORDER — ALPRAZOLAM 0.25 MG PO TABS
0.2500 mg | ORAL_TABLET | Freq: Two times a day (BID) | ORAL | Status: DC | PRN
  Administered 2011-10-19: 0.25 mg via ORAL
  Filled 2011-10-19: qty 1

## 2011-10-19 MED ORDER — OXYCODONE-ACETAMINOPHEN 5-325 MG PO TABS: 1.0000 | ORAL_TABLET | ORAL | Status: AC | PRN

## 2011-10-19 MED FILL — Sodium Chloride IV Soln 0.9%: INTRAVENOUS | Qty: 50 | Status: AC

## 2011-10-19 NOTE — Progress Notes (Signed)
Pt ambulating independently.  Reviewed ed. [facility] has been calling and he is interested in pursuing start of program. Left materials.  [id number]  Ethelda Chick CES, ACSM

## 2011-10-19 NOTE — Discharge Summary (Signed)
Physician Discharge Summary  Patient ID: Andrew West MRN: [id number] DOB/AGE: [birth year] 45 y.o.  Admit date: 10/17/2011 Discharge date: 10/19/2011  Admission Diagnoses:  Discharge Diagnoses:  Principal Problem:  *NSTEMI (non-ST elevated myocardial infarction) Active Problems:  DM  HYPERLIPIDEMIA  OBESITY  HYPERTENSION  CAD (coronary artery disease), native coronary artery  CHEST PAIN  Presence of stent in left circumflex coronary artery   Discharged Condition: stable  Hospital Course:  The the patient is a 45 year old black male with history of coronary artery disease (h/o MI ~2 y ago), in-stent restenosis to an obtuse marginal with a new obtuse marginal site intervened in Oct 2012 with Multilink Vision stents, known coronary artery disease with previous percutaneous coronary intervention in 2004. Prior to his October admission, he was admitted in early September 2011 with an abnormal Myoview, but he declined catheterization at that time. PMH: type 2 non-insulin-dependent diabetes, treated hypertension, history of statin intolerance, morbid obesity, history of noncompliance in the past. The patient states he developed a burning feeling in his chest last Thursday. It was a dull in sensation. At its worst, was 8 to 9 out of 10 in intensity. Patient was admitted to step down unit. He was started on IV heparin and nitroglycerin. Cardiac enzymes were cycled 3 times every 8 hours. Continued Plavix Lopressor aspirin lisinopril and statin. Peak troponin ended up being 2.07 at peak.  Patient was scheduled for left heart catheterization is completed on Monday the 26th. Catheterization revealed severe in-stent restenosis/thrombosis of recently placed bare-metal stent crossing from the mid circumflex into the lateral OM jailing the AV groove circumflex (dominant) with involvement of the AV groove circumflex. Successful bifurcation lesion cutting balloon atherectomy of the in-stent  restenosis/thrombosis involving the bifurcation of the AV groove circumflex and lateral OM 1 with a 2.75 mm x 10 mm cutting balloon for the recently placed stent and a 2.5 mm x 10 mm cutting balloon through the stent into the AV groove circumflex -- restoring TIMI-3 flow down both distal branches. Subtotal Occlusion of the ramus intermedius proximal stent with minimal/trace flow distally into what used to be a large caliber branching vessel with a distal stent.  Known occlusion of a nondominant right coronary artery, and diffuse moderate disease in a large wraparound LAD system. Currently the patient is in stable condition he has been seen by Dr. Rennis Golden is ready for discharge home. We did check a P2Y12 which came back at 167 P-R U.  Significant Diagnostic Studies:  Chest x-ray 10/17/11 Findings: Some increase in central pulmonary vascular congestion.  No confluent airspace infiltrate or overt interstitial edema. No  effusion. Heart size upper limits normal. Regional bones  unremarkable. Coronary stent noted.  IMPRESSION:  1. Interval increase in central pulmonary vascular congestion.  Left heart catheterization11/26/12: Hemodynamics:  Central Aortic Pressure: 148/98 mmHg  LV Pressure / LV End diastolic Pressure: 148/20 mmHg; 18 mmHg  Coronary Angiographic Data: Left Coronary dominant  Left Main: Long large caliber, angiographically normal  Left Anterior Descending (LAD): Large-caliber diffuse mild luminal irregularities, large septal perforator-bifurcating, large branching D1. Proximal 30-40% tubular stenosis, mid 30-40%, distal 60-70%.  The LAD wraps around the apex. D1 gives collaterals to an RV marginal branch  1st diagonal (D1): Moderate caliber vessel, bifurcating, no angiographic evidence of significant disease  Circumflex (LCx): Moderate to large caliber, dominant vessel; the proximal and midportion of the vessel had diffuse mild luminal irregularities, the previously placed stents in the  mid vessel prior to the lateral OM (called  OM2 for purposes of this report, called left PDA in the last report, called OM1 in previous reports) have mild in-stent restenosis, this is in the recently ballooned area.  Small 1st obtuse marginal: Small caliber, diffusely disased (previously not called OM1); also a moderate atrial branch coursing to the right side of the heart  At the bifurcation of the AV groove circumflex and the lateral OM 1 is a hazy filling defect involving both branches, this is consistent with in-stent restenosis with thrombus. Lateral 2nd obtuse marginal (OM1): Moderate caliber, ostial 90% hazy/thrombotic in-stent restenosis; distal flow is TIMI 2 in a diffusely diseased vessel.  AV Groove Circumflex Artery: Ostial/bifurcation 90% stenosis, this is in a jailed portion of this vessel. The follow-on vessel has a patent stent with diffuse disease; TIMI 2 flow distal Several small posterior lateral branch: First brach- small caliber views disease, second branch small caliber with ostial 60-70% lesion.  Left PDA: Small to moderate caliber, diffusely diseased  Ramus Intermedius: Proximal stent 100% occluded with trace distal runoff into what used to be a large branching vessel reaching the apex. The distal stent there is not visible.  Right Coronary Artery: Small nondominant, 100% occluded (known to be occluded since 2003) PERCUTANEOUS CORONARY INTERVENTION PROCEDURE  After reviewing the initial cineangiography images, the culprit lesion(s) at the bifurcation of the AV groove circumflex and lateral OM 1 was identified, and the decision was made to proceed with percutaneous coronary intervention with cutting balloon angioplasty. The plan was discussed with the patient. Additional sedation was administered.  A weight based bolus of IV Angiomax was administered and the drip was continued until completion of the procedure, and continued for one hour post procedure.  Oral Clopidogrel 75 mg had  been administered this morning. He has been on clopidogrel daily since his last intervention. Prior to initiating the procedure, blood sample for P2Y12 assay was sent to the lab.  The Guide catheter(s) were advanced over long-exchange J-wire and used to engage the left Coronary Artery. Initially an XB 3.5 guide catheter was used but was unsuccessful to engage the left coronary artery. This was exchanged for a VODA left guide catheter was successfully used to engage the left coronary artery.  An ACT of > 200 Sec was confirmed prior to advancing the Guidewires.  As this is a bifurcation intervention, 2 guidewires were advanced with a BMW in the lateral OM 1 and a Prowater in the AV groove circumflex, through the existing stent. With both wires in place, temperature made to pass several balloons through the recently placed stent across the bifurcation into the distal AV groove circumflex (initially an Emerge 2.0 mm 12 mm balloon was attempted to cross unsuccessfully, next a mini trek 1.2 mm x 12 mm balloon was also in crossing ) A low pressure inflation of the 1.2 mm balloon at the presumed bifurcation site did not allow for passive and balloon. The wire was then pulled back and readvanced to occasions successfully back down the AV groove circumflex, however, no balloon able to pass the stent.  Attention was then turned to the OM branch which was predilated with the Emerge 2.0 mm x 12 mm balloon which restored TIMI-3 flow down the distal OM. Attempts were then made to recross the stent into the AV groove , however they were unsuccessful. For the last attempt balloon advancement led to losing guide engagement and both wires were pullback into the aorta. At this time guide catheter was then reengaged into the left coronary  artery. The Prowater wire was then advanced into lateral OM 1. The bifurcation segment was then dilated with a 2.75 mm x 10 mm Flextome cutting balloon as detailed below. The balloon was then  removed out of the body, and the guidewire surreptitiously was pulled back just to the ostium of the AV groove circumflex. The wire was then advanced easily into the AV groove circumflex and a loop configuration, therefore decision was made to attempt to recross the recently placed stent into the AV groove circumflex. First the mini trek 1.2 mm x 12 mm balloon was successfully advanced across the stent and inflated, next an Emerge 2.60mm x 12 mm balloon followed by a Flextome 2.5 mm x 10 mm balloon which was then used to successfully performing angioplasty on the jailed AV groove circumflex providing TIMI-3 flow into the distal dominant circumflex branches. This is a complex difficult seizure due to this being a bifurcation lesion intervention requiring to guidewires and multiple balloon exchanges to cross the recently placed stent overlapping the previously placed stents.  Discharge Exam: Blood pressure 136/99, pulse 82, temperature 98.2 F (36.8 C), temperature source Oral, resp. rate 17, height 5\' 8"  (1.727 m), weight 123.5 kg (272 lb 4.3 oz), SpO2 96.00%.  Diposition: patient will be discharged home in stable condition. He'll follow up with Dr. Rennis Golden approximately one to 2 weeks. He is to increase activity slowly. Maintain a heart healthy low-sodium diet.   Discharge Orders    Future Orders Please Complete By Expires   Diet - low sodium heart healthy      Increase activity slowly      Discharge instructions      Comments:   Stay out of work until seen by Dr. Rennis Golden in the clinic.   Call MD for:  redness, tenderness, or signs of infection (pain, swelling, redness, odor or green/yellow discharge around incision site)        Current Discharge Medication List    START taking these medications   Details  acetaminophen (TYLENOL) 325 MG tablet Take 2 tablets (650 mg total) by mouth every 4 (four) hours as needed. Qty: 30 tablet    oxyCODONE-acetaminophen (PERCOCET) 5-325 MG per tablet Take 1-2  tablets by mouth every 4 (four) hours as needed. Qty: 15 tablet, Refills: 0      CONTINUE these medications which have CHANGED   Details  metFORMIN (GLUCOPHAGE) 1000 MG tablet Take 1 tablet (1,000 mg total) by mouth 2 (two) times daily with a meal. Restart this medication on Thursday, Oct 21, 2011. Qty: 60 tablet, Refills: 10      CONTINUE these medications which have NOT CHANGED   Details  aspirin 325 MG buffered tablet Take 325 mg by mouth daily.      clopidogrel (PLAVIX) 75 MG tablet TAKE ONE TABLET BY MOUTH EVERY DAY Qty: 30 tablet, Refills: 12    isosorbide mononitrate (IMDUR) 30 MG 24 hr tablet Take 30 mg by mouth daily.      lisinopril-hydrochlorothiazide (PRINZIDE,ZESTORETIC) 20-12.5 MG per tablet Take 1 tablet by mouth daily.      metoprolol (LOPRESSOR) 50 MG tablet Take 50 mg by mouth 2 (two) times daily.     nitroGLYCERIN (NITROSTAT) 0.6 MG SL tablet Place 0.6 mg under the tongue every 5 (five) minutes as needed. As needed for chest pain     pantoprazole (PROTONIX) 20 MG tablet Take 20 mg by mouth daily.      pravastatin (PRAVACHOL) 40 MG tablet Take 40 mg by mouth  at bedtime.        STOP taking these medications     simvastatin (ZOCOR) 20 MG tablet           Signed: Meko Masterson W 10/19/2011, 12:55 PM

## 2011-10-19 NOTE — Progress Notes (Signed)
Pt. Seen and examined. Agree with the NP/PA-C note as written.   His chest pain has resolved. His current complaint is back pain/spasm .Marland Kitchen Improved with pain medication.  Agree to check P2y12 assay today to assess platelet inhibition on plavix/aspirin. If low, would not hesitate to switch to ticagrelor before discharge.  Likely able to discharge later today. Wrist looks good, intact pulse.  Chrystie Nose, MD Attending Cardiologist [facility]

## 2011-10-19 NOTE — Progress Notes (Signed)
Patient discharged to home after receiving discharge instructions.  Patient verbalized understanding of discharge instructions and when and how often to take medications.  Patient reports having some back pain of 2/10 after receiving Percocet.  IV catheter removed with catheter intact.  Incision site clean, dry and intact. Alonna Buckler 10/19/2011

## 2011-10-19 NOTE — Progress Notes (Signed)
   CARE MANAGEMENT NOTE 10/19/2011  Patient:  Andrew West, Andrew West   Account Number:  [id number]  Date Initiated:  10/19/2011  Documentation initiated by:  GRAVES-BIGELOW,Bashar Milam  Subjective/Objective Assessment:   Pt in with nstemi. Plan for home on plavix. Plavix is generic now and pt was taking before admission.     Action/Plan:   No needs assessed by CM at this time.   Anticipated DC Date:  10/19/2011   Anticipated DC Plan:  HOME/SELF CARE      DC Planning Services  CM consult      Choice offered to / List presented to:             Status of service:  Completed, signed off Medicare Important Message given?   (If response is "NO", the following Medicare IM given date fields will be blank) Date Medicare IM given:   Date Additional Medicare IM given:    Discharge Disposition:  HOME/SELF CARE  Per UR Regulation:    Comments:

## 2011-10-19 NOTE — Discharge Summary (Signed)
Pt. Seen and examined. Agree with the NP/PA-C note as written. Will see him in the office. Likely re-refer to cardiac rehabilitation. Ok for d/c today.  Chrystie Nose, MD Attending Cardiologist [facility]

## 2011-10-19 NOTE — Progress Notes (Signed)
Subjective:  No complaints overnight.  Objective:  Vital Signs in the last 24 hours: Temp:  [98.1 F (36.7 C)-98.7 F (37.1 C)] 98.2 F (36.8 C) (11/27 0400) Pulse Rate:  [66-97] 94  (11/26 2200) Resp:  [15-18] 17  (11/27 0400) BP: (122-153)/(72-101) 151/83 mmHg (11/27 0400) SpO2:  [91 %-98 %] 94 % (11/27 0400)  Intake/Output from previous day: 11/26 0701 - 11/27 0700 In: 1227 [P.O.:430; I.V.:797] Out: 600 [Urine:600] Intake/Output from this shift: Total I/O In: -  Out: 200 [Urine:200]  Physical Exam: General appearance: alert, cooperative, no distress and very anxious Neck: no adenopathy, no carotid bruit, no JVD, supple, symmetrical, trachea midline and thyroid not enlarged, symmetric, no tenderness/mass/nodules Lungs: clear to auscultation bilaterally and non-labored Heart: regular rate and rhythm, S1, S2 normal, no murmur, click, rub or gallop Abdomen: soft, non-tender; bowel sounds normal; no masses,  no organomegaly Extremities: extremities normal, atraumatic, no cyanosis or edema Pulses: 2+ and symmetric R radial pulse bounding. Skin: Skin color, texture, turgor normal. No rashes or lesions Neurologic: Alert and oriented X 3, normal strength and tone. Normal symmetric reflexes. Normal coordination and gait   Lab Results:  Basename 10/18/11 0328 10/17/11 1021  WBC 8.1 8.7  HGB 13.6 14.5  PLT 243 233    Basename 10/18/11 0328 10/17/11 1021  NA 143 139  K 3.9 3.7  CL 107 103  CO2 28 26  GLUCOSE 97 110*  BUN 13 10  CREATININE 0.83 0.77    Basename 10/18/11 0017 10/17/11 1836  TROPONINI 1.90* 2.07*   Hepatic Function Panel No results found for this basename: PROT,ALBUMIN,AST,ALT,ALKPHOS,BILITOT,BILIDIR,IBILI in the last 72 hours No results found for this basename: CHOL in the last 72 hours No results found for this basename: PROTIME in the last 72 hours  Imaging: Dg Chest 2 View  10/17/2011  *RADIOLOGY REPORT*  Clinical Data: Chest pain, cough  CHEST  - 2 VIEW  Comparison: 08/30/2011  Findings: Some increase in central pulmonary vascular congestion. No confluent airspace infiltrate or overt interstitial edema.  No effusion.  Heart size upper limits normal.  Regional bones unremarkable.  Coronary stent noted.  IMPRESSION:  1.  Interval increase in central pulmonary vascular congestion.  Original Report Authenticated By: Osa Craver, M.D.    Cardiac Studies:  Assessment/Plan:  1)Severe in-stent restenosis/thrombosis of recently placed bare-metal stent crossing from the mid circumflex into the lateral OM jailing the AV groove circumflex (dominant) with involvement of the AV groove circumflex. 2)  Successful bifurcation lesion cutting balloon atherectomy of the in-stent restenosis/thrombosis involving the bifurcation of the AV groove circumflex and lateral OM 1 with a 2.75 mm x 10 mm cutting balloon for the recently placed stent and a 2.5 mm x 10 mm cutting balloon through the stent into the AV groove circumflex -- restoring TIMI-3 flow down both distal branches.   3)  Subtotal Occlusion of the ramus intermedius proximal stent with minimal/trace flow distally into what used to be a large caliber branching vessel with a distal stent. 4)Known occlusion of a nondominant right coronary artery, and diffuse moderate disease in a large wraparound LAD system.    .  DM   .  HYPERCHOLESTEROLEMIA   .  HYPERLIPIDEMIA   .  OBESITY   .  TOBACCO ABUSE   .  DEPRESSION   .  PERIPHERAL NEUROPATHY   .  HYPERTENSION   .  MYOCARDIAL INFARCTION, HX OF   .  CAD (coronary artery disease), native coronary artery   .  ALLERGIC RHINITIS   .  GERD   .  RECTAL BLEEDING   .  HEMATOCHEZIA   .  GROSS HEMATURIA   .  CERVICAL RADICULOPATHY, LEFT   .  BACK PAIN   .  CHEST PAIN   .  ABDOMINAL PAIN, LEFT LOWER QUADRANT   .  ABDOMINAL PAIN, EPIGASTRIC   .  COLONIC POLYPS, HX OF   .  ANAL FISSURE, HX OF   .  NSTEMI (non-ST elevated myocardial infarction)     Increase activity, continue current meds. Further recs per md.   LOS: 2 days    Makara Lanzo L 10/19/2011,

## 2011-12-19 ENCOUNTER — Encounter (HOSPITAL_COMMUNITY): Payer: Self-pay | Admitting: *Deleted

## 2011-12-19 ENCOUNTER — Emergency Department (HOSPITAL_COMMUNITY)
Admission: EM | Admit: 2011-12-19 | Discharge: 2011-12-19 | Disposition: A | Discharge status: Home / Self Care | Payer: BC Managed Care – PPO | Attending: Emergency Medicine | Admitting: Emergency Medicine

## 2011-12-19 ENCOUNTER — Emergency Department (HOSPITAL_COMMUNITY): Payer: BC Managed Care – PPO

## 2011-12-19 DIAGNOSIS — Z7982 Long term (current) use of aspirin: Secondary | ICD-10-CM | POA: Insufficient documentation

## 2011-12-19 DIAGNOSIS — R05 Cough: Secondary | ICD-10-CM | POA: Insufficient documentation

## 2011-12-19 DIAGNOSIS — E119 Type 2 diabetes mellitus without complications: Secondary | ICD-10-CM | POA: Insufficient documentation

## 2011-12-19 DIAGNOSIS — R059 Cough, unspecified: Secondary | ICD-10-CM | POA: Insufficient documentation

## 2011-12-19 DIAGNOSIS — I252 Old myocardial infarction: Secondary | ICD-10-CM | POA: Insufficient documentation

## 2011-12-19 DIAGNOSIS — Z79899 Other long term (current) drug therapy: Secondary | ICD-10-CM | POA: Insufficient documentation

## 2011-12-19 DIAGNOSIS — IMO0001 Reserved for inherently not codable concepts without codable children: Secondary | ICD-10-CM | POA: Insufficient documentation

## 2011-12-19 DIAGNOSIS — R197 Diarrhea, unspecified: Secondary | ICD-10-CM | POA: Insufficient documentation

## 2011-12-19 DIAGNOSIS — I10 Essential (primary) hypertension: Secondary | ICD-10-CM | POA: Insufficient documentation

## 2011-12-19 DIAGNOSIS — B9789 Other viral agents as the cause of diseases classified elsewhere: Secondary | ICD-10-CM | POA: Insufficient documentation

## 2011-12-19 DIAGNOSIS — I2510 Atherosclerotic heart disease of native coronary artery without angina pectoris: Secondary | ICD-10-CM | POA: Insufficient documentation

## 2011-12-19 DIAGNOSIS — R5381 Other malaise: Secondary | ICD-10-CM | POA: Insufficient documentation

## 2011-12-19 DIAGNOSIS — B349 Viral infection, unspecified: Secondary | ICD-10-CM

## 2011-12-19 DIAGNOSIS — R5383 Other fatigue: Secondary | ICD-10-CM | POA: Insufficient documentation

## 2011-12-19 DIAGNOSIS — R109 Unspecified abdominal pain: Secondary | ICD-10-CM | POA: Insufficient documentation

## 2011-12-19 DIAGNOSIS — R6883 Chills (without fever): Secondary | ICD-10-CM | POA: Insufficient documentation

## 2011-12-19 DIAGNOSIS — I519 Heart disease, unspecified: Secondary | ICD-10-CM | POA: Insufficient documentation

## 2011-12-19 DIAGNOSIS — E7800 Pure hypercholesterolemia, unspecified: Secondary | ICD-10-CM | POA: Insufficient documentation

## 2011-12-19 DIAGNOSIS — R1110 Vomiting, unspecified: Secondary | ICD-10-CM | POA: Insufficient documentation

## 2011-12-19 LAB — BASIC METABOLIC PANEL
BUN: 12 mg/dL (ref 6–23)
CO2: 23 mEq/L (ref 19–32)
Chloride: 103 mEq/L (ref 96–112)
Creatinine, Ser: 0.86 mg/dL (ref 0.50–1.35)
Glucose, Bld: 90 mg/dL (ref 70–99)

## 2011-12-19 LAB — URINALYSIS, ROUTINE W REFLEX MICROSCOPIC
Glucose, UA: NEGATIVE mg/dL
Ketones, ur: 40 mg/dL — AB
Leukocytes, UA: NEGATIVE
pH: 5.5 (ref 5.0–8.0)

## 2011-12-19 LAB — DIFFERENTIAL
Eosinophils Relative: 0 % (ref 0–5)
Lymphocytes Relative: 48 % — ABNORMAL HIGH (ref 12–46)
Monocytes Absolute: 0.6 10*3/uL (ref 0.1–1.0)
Monocytes Relative: 16 % — ABNORMAL HIGH (ref 3–12)
Neutro Abs: 1.4 10*3/uL — ABNORMAL LOW (ref 1.7–7.7)

## 2011-12-19 LAB — CBC
HCT: 45.5 % (ref 39.0–52.0)
Hemoglobin: 15.9 g/dL (ref 13.0–17.0)
MCV: 78 fL (ref 78.0–100.0)
WBC: 3.9 10*3/uL — ABNORMAL LOW (ref 4.0–10.5)

## 2011-12-19 LAB — URINE MICROSCOPIC-ADD ON

## 2011-12-19 MED ORDER — SODIUM CHLORIDE 0.9 % IV SOLN
Freq: Once | INTRAVENOUS | Status: AC
  Administered 2011-12-19: 09:00:00 via INTRAVENOUS

## 2011-12-19 MED ORDER — SODIUM CHLORIDE 0.9 % IV BOLUS (SEPSIS)
500.0000 mL | Freq: Once | INTRAVENOUS | Status: AC
  Administered 2011-12-19: 500 mL via INTRAVENOUS

## 2011-12-19 MED ORDER — ONDANSETRON HCL 4 MG PO TABS: 4.0000 mg | ORAL_TABLET | Freq: Four times a day (QID) | ORAL | Status: AC

## 2011-12-19 MED ORDER — PROMETHAZINE HCL 25 MG/ML IJ SOLN: 25.0000 mg | Freq: Once | INTRAMUSCULAR | Status: DC

## 2011-12-19 MED ORDER — HYDROCODONE-ACETAMINOPHEN 5-325 MG PO TABS: 1.0000 | ORAL_TABLET | ORAL | Status: AC | PRN

## 2011-12-19 MED ORDER — ONDANSETRON HCL 4 MG/2ML IJ SOLN
4.0000 mg | Freq: Once | INTRAMUSCULAR | Status: AC
  Administered 2011-12-19: 4 mg via INTRAVENOUS
  Filled 2011-12-19: qty 2

## 2011-12-19 NOTE — ED Notes (Signed)
Pt presents to department for evaluation of diffuse abdominal pain, N/V/D, cold like symptoms and body aches. Ongoing since last Thursday, pt states symptoms have become worse since. Also states fever/chills. 7/10 pain at the time. Skin warm and dry. Abdomen soft and non tender to palpation. Bowel sounds present. Last BM this morning, liquid stool. Pt is conscious alert and oriented x4. No signs of distress at the time.

## 2011-12-19 NOTE — ED Notes (Addendum)
Pt medicated, resting quietly at the time. Vital signs stable. Will continue to monitor. States 3/10 diffuse abdominal pain. No active vomiting. Urinal at bedside, pt informed of need for urine sample.

## 2011-12-19 NOTE — ED Notes (Signed)
Patient is resting comfortably. 

## 2011-12-19 NOTE — ED Notes (Signed)
Pt resting quietly at the time. Denies pain at present. States he feels better. Talking on phone in room. No active vomiting.

## 2011-12-19 NOTE — ED Provider Notes (Signed)
Medical screening examination/treatment/procedure(s) were performed by non-physician practitioner and as supervising physician I was immediately available for consultation/collaboration.   Mckade Gurka A. Bari Handshoe, MD 12/19/11 1508 

## 2011-12-19 NOTE — ED Provider Notes (Signed)
History     CSN: [id number]  Arrival date & time 12/19/11  1308   First MD Initiated Contact with Patient 12/19/11 [phone]      Chief Complaint  Patient presents with  . Abdominal Pain  . Nausea  . Diarrhea    (Consider location/radiation/quality/duration/timing/severity/associated sxs/prior treatment) Patient is a 46 y.o. male presenting with weakness. The history is provided by the patient.  Weakness The primary symptoms include nausea and vomiting. Primary symptoms do not include fever. Primary symptoms comment: He complains of N, V, D and generalized body aches with chills for 3 days. The symptoms began 3 to 5 days ago.  Additional symptoms include weakness. Medical issues also include diabetes and hypertension. Workup history includes cardiac workup.    Past Medical History  Diagnosis Date  . Myocardial infarction 2 yrs ago  . Hypertension   . Diabetes mellitus   . Heart disease   . Hypercholesteremia   . Coronary artery disease   . Angina   . NSTEMI (non-ST elevated myocardial infarction) 10/18/2011  . CAD (coronary artery disease), native coronary artery 04/18/2008    Past Surgical History  Procedure Date  . Coronary angioplasty with stent placement 09/01/11    History reviewed. No pertinent family history.  History  Substance Use Topics  . Smoking status: Former Games developer  . Smokeless tobacco: Not on file   Comment: Pt st's he smoked 1 cig. tody to see if it would ease the pain  . Alcohol Use: No      Review of Systems  Constitutional: Negative for fever and chills.  HENT: Negative.  Negative for congestion and sore throat.   Respiratory: Positive for cough.   Cardiovascular: Negative.   Gastrointestinal: Positive for nausea, vomiting, abdominal pain and diarrhea.  Musculoskeletal: Positive for myalgias.  Skin: Negative.   Neurological: Positive for weakness.    Allergies  Iohexol  Home Medications   Current Outpatient Rx  Name Route Sig Dispense  Refill  . ASPIRIN BUFFERED 325 MG PO TABS Oral Take 325 mg by mouth daily.      Marland Kitchen CLOPIDOGREL BISULFATE 75 MG PO TABS  TAKE ONE TABLET BY MOUTH EVERY DAY 30 tablet 12  . ISOSORBIDE MONONITRATE ER 30 MG PO TB24 Oral Take 30 mg by mouth daily.      Marland Kitchen LISINOPRIL-HYDROCHLOROTHIAZIDE 20-12.5 MG PO TABS Oral Take 1 tablet by mouth daily.      Marland Kitchen METFORMIN HCL 1000 MG PO TABS Oral Take 1,000 mg by mouth 2 (two) times daily with a meal. Restart this medication on Thursday, Oct 21, 2011.    Marland Kitchen METOPROLOL TARTRATE 50 MG PO TABS Oral Take 50 mg by mouth 2 (two) times daily.     Marland Kitchen NITROGLYCERIN 0.6 MG SL SUBL Sublingual Place 0.6 mg under the tongue every 5 (five) minutes as needed. As needed for chest pain     . PANTOPRAZOLE SODIUM 20 MG PO TBEC Oral Take 20 mg by mouth daily.      Marland Kitchen PRAVASTATIN SODIUM 40 MG PO TABS Oral Take 40 mg by mouth at bedtime.        BP 136/90  Pulse 82  Temp(Src) 97.9 F (36.6 C) (Oral)  Resp 20  SpO2 98%  Physical Exam  Constitutional: He appears well-developed and well-nourished.  HENT:  Head: Normocephalic.  Neck: Normal range of motion. Neck supple.  Cardiovascular: Normal rate and regular rhythm.   Pulmonary/Chest: Effort normal and breath sounds normal.  Abdominal: Soft. Bowel sounds are normal.  There is no tenderness. There is no rebound and no guarding.  Musculoskeletal: Normal range of motion.  Neurological: He is alert. No cranial nerve deficit.  Skin: Skin is warm and dry. No rash noted.  Psychiatric: He has a normal mood and affect.    ED Course  Procedures (including critical care time) He is feeling better with fluids, tolerating PO fluids. Feels he is ready for discharge home. Labs Reviewed  CBC - Abnormal; Notable for the following:    WBC 3.9 (*)    RBC 5.83 (*)    All other components within normal limits  DIFFERENTIAL - Abnormal; Notable for the following:    Neutrophils Relative 35 (*)    Neutro Abs 1.4 (*)    Lymphocytes Relative 48 (*)      Monocytes Relative 16 (*)    All other components within normal limits  BASIC METABOLIC PANEL  URINALYSIS, ROUTINE W REFLEX MICROSCOPIC   Dg Chest 2 View  12/19/2011  *RADIOLOGY REPORT*  Clinical Data: Cough, fever, body aches  CHEST - 2 VIEW  Comparison: Chest radiograph 10/17/2011  Findings: Stable cardiac silhouette.  There is mild perihilar air space disease / central venous pulmonary congestion not changed from prior.  No pleural fluid.  No pneumothorax.  IMPRESSION: Mild per hilar air space disease suggests pulmonary edema.  Less likely pneumonia.  No significant change from prior.  Original Report Authenticated By: Genevive Bi, M.D.     No diagnosis found.    MDM          Rodena Medin, PA-C 12/19/11 1154

## 2011-12-19 NOTE — ED Notes (Signed)
The pt is aching all over abd pain  nv and diarrhea since last thursday

## 2012-03-17 ENCOUNTER — Encounter (HOSPITAL_COMMUNITY): Payer: Self-pay | Admitting: Respiratory Therapy

## 2012-03-23 ENCOUNTER — Other Ambulatory Visit: Payer: Self-pay | Admitting: Neurosurgery

## 2012-03-23 NOTE — Pre-Procedure Instructions (Signed)
[address]  03/23/2012   Your procedure is scheduled on:  Fri, May 10 @ 0730 AM  Report to [facility] at 0530 AM.  Call this number if you have problems the morning of surgery: [phone]   Remember:   Do not eat food:After Midnight.  May have clear liquids: up to 4 Hours before arrival.(until 1:30 am)  Clear liquids include soda, tea, black coffee, apple or grape juice, broth,water  Take these medicines the morning of surgery with A SIP OF WATER: Dexilant,Imdur,Metoprolol,and Pain Pill(if needed)   Do not wear jewelry  Do not wear lotions, powders, or perfumes.   Do not bring valuables to the hospital.  Contacts, dentures or bridgework may not be worn into surgery.  Leave suitcase in the car. After surgery it may be brought to your room.  For patients admitted to the hospital, checkout time is 11:00 AM the day of discharge.   Patients discharged the day of surgery will not be allowed to drive home.  Special Instructions: CHG Shower Use Special Wash: 1/2 bottle night before surgery and 1/2 bottle morning of surgery.   Please read over the following fact sheets that you were given: Pain Booklet, Coughing and Deep Breathing, MRSA Information and Surgical Site Infection Prevention

## 2012-03-24 ENCOUNTER — Encounter (HOSPITAL_COMMUNITY): Payer: Self-pay

## 2012-03-24 ENCOUNTER — Encounter (HOSPITAL_COMMUNITY)
Admission: RE | Admit: 2012-03-24 | Discharge: 2012-03-24 | Disposition: A | Discharge status: Home / Self Care | Payer: BC Managed Care – PPO | Source: Ambulatory Visit | Attending: Neurosurgery | Admitting: Neurosurgery

## 2012-03-24 ENCOUNTER — Other Ambulatory Visit (HOSPITAL_COMMUNITY): Payer: BC Managed Care – PPO

## 2012-03-24 HISTORY — DX: Gastro-esophageal reflux disease without esophagitis: K21.9

## 2012-03-24 LAB — BASIC METABOLIC PANEL
CO2: 29 mEq/L (ref 19–32)
Calcium: 9 mg/dL (ref 8.4–10.5)
Creatinine, Ser: 0.84 mg/dL (ref 0.50–1.35)
GFR calc Af Amer: >90 mL/min (ref >90)
GFR calc non Af Amer: >90 mL/min (ref >90)
Sodium: 141 mEq/L (ref 135–145)

## 2012-03-24 LAB — SURGICAL PCR SCREEN
MRSA, PCR: NEGATIVE
Staphylococcus aureus: NEGATIVE

## 2012-03-24 LAB — CBC
Platelets: 232 10*3/uL (ref 150–400)
RBC: 5.52 MIL/uL (ref 4.22–5.81)
RDW: 13.9 % (ref 11.5–15.5)
WBC: 8.8 10*3/uL (ref 4.0–10.5)

## 2012-03-24 NOTE — Progress Notes (Signed)
Requested last office visit and sleep study from PCP Dr. Loleta Chance at [facility]. Pt stated "I was just there last week and my blood pressure was high but they said it was because of the back pain I was having." Pt informed Nurse that he recently had a chest xray and MRI done at Dr. Lindalou Hose office records requested. Records were placed on chart from cardiologist Dr. Rennis Golden.

## 2012-03-27 NOTE — Consult Note (Addendum)
Anesthesia Chart Review:  Patient is a 46 year old male scheduled for a left L2-3 laminectomy/microdisketomy on 03/31/12.  History includes smoking, uncontrolled HTN, hyercholesterolemia, CAD/MI s/p OM2 and CX stents, in stent restenosis to the LAD, and  nondominant occluded RCA filled by collaterals, DM2, GERD, headaches, PNA, SOB.  PCP is Dr. Loleta Chance at the [facility].  BP on 03/24/12 was 170/102 (which was actually somewhat better than when he was seen by his Cardiologist for clearance).  His Cardiologist is Dr. Rennis Golden [facility]).  He was seen for pre-operative evaluation on 03/06/12 and ultimately cleared with intermediate risk despite uncontrolled HTN--as Dr. Rennis Golden felt pain was a contributing factor.    His last coronary intervention was on 10/17/11 which included cutting balloon angioplasty for in-stent re-stenosis of BMS (initially placed 08/2011) to OM2 involving bifurcation of OM2 and atrioventricular groove CX.  He has multiple cardiac caths that can be viewed in Epic under the Notes Tab.  (His last stress test was on 07/29/11 which was abnormal, EF 53%.  He has since had a few cardiac caths with interventions).  Echo on 06/03/10 showed: - Normal LV size with mild LV hypertrophy. EF 55-60%, no regional wall motion abnormalities noted. Moderate diastolic dysfunction. Normal RV size and systolic function.  EKG on 10/19/11 showed NSR, nonspecific ST/T wave abnormality, prolonged QT.  CXR on 12/19/11 showed: Mild per hilar air space disease suggests pulmonary edema. Less likely pneumonia. No significant change from prior on 10/17/11.  Both CXR were done during acute visits with cardiopulmonary symptoms.  I'll order a CXR on arrival to ensure stability/improvement.    Labs noted.  Cr 0.84.  Glucose 104.  H/H WNL.  T&S is ordered by the surgeon (to be done on arrival).    I reviewed his Cardiac clearance despite uncontrolled HTN history with Anesthesiologist Dr. Noreene Larsson on 03/24/12.  He would recommend the  patient take all of his anti-hypertensive medications and pain medication (as prescribed) on the morning of surgery.  I was unable to reach him on the phone number provided, so I left a voicemail with Erie Noe at Dr. Dalphine Handing' office to either call Mr. Bardon with these instructions or provide me with an alternate phone number to reach him.    Addendum 03/28/12 1530  I was able to reach Mr. Osment at [phone] and instruct him on the above recommendations.  He wants to take all of his antihypertensive medications the night before surgery  because he is concerned about forgetting them on the morning of surgery due to being in a rush to arrive by 0530.  He already takes metoprolol 50 mg at bedtime (for BID dosing), so I advised him to still wait until he wakes up on the morning of surgery to take his morning dose of metoprolol with his Imdur and lisinopril/HCTZ--and a pain pill if he is due.  He still seems reluctant, but understands that this is what Anesthesia is advising in hopes that his BP will be reasonable on arrival.    Shonna Chock, PA-C

## 2012-03-30 MED ORDER — CEFAZOLIN SODIUM-DEXTROSE 2-3 GM-% IV SOLR
2.0000 g | INTRAVENOUS | Status: AC
  Administered 2012-03-31: 2 g via INTRAVENOUS
  Filled 2012-03-30: qty 50

## 2012-03-31 ENCOUNTER — Encounter (HOSPITAL_COMMUNITY)
Admission: RE | Disposition: A | Discharge status: Home / Self Care | Payer: Self-pay | Source: Ambulatory Visit | Attending: Neurosurgery

## 2012-03-31 ENCOUNTER — Encounter (HOSPITAL_COMMUNITY): Payer: Self-pay | Admitting: Vascular Surgery

## 2012-03-31 ENCOUNTER — Ambulatory Visit (HOSPITAL_COMMUNITY): Payer: BC Managed Care – PPO

## 2012-03-31 ENCOUNTER — Ambulatory Visit (HOSPITAL_COMMUNITY): Payer: BC Managed Care – PPO | Admitting: Vascular Surgery

## 2012-03-31 ENCOUNTER — Ambulatory Visit (HOSPITAL_COMMUNITY)
Admission: RE | Admit: 2012-03-31 | Discharge: 2012-03-31 | Disposition: A | Discharge status: Home / Self Care | Payer: BC Managed Care – PPO | Source: Ambulatory Visit | Attending: Neurosurgery | Admitting: Neurosurgery

## 2012-03-31 DIAGNOSIS — I2510 Atherosclerotic heart disease of native coronary artery without angina pectoris: Secondary | ICD-10-CM | POA: Insufficient documentation

## 2012-03-31 DIAGNOSIS — K219 Gastro-esophageal reflux disease without esophagitis: Secondary | ICD-10-CM | POA: Insufficient documentation

## 2012-03-31 DIAGNOSIS — M5126 Other intervertebral disc displacement, lumbar region: Secondary | ICD-10-CM | POA: Insufficient documentation

## 2012-03-31 DIAGNOSIS — E119 Type 2 diabetes mellitus without complications: Secondary | ICD-10-CM | POA: Insufficient documentation

## 2012-03-31 DIAGNOSIS — I10 Essential (primary) hypertension: Secondary | ICD-10-CM | POA: Insufficient documentation

## 2012-03-31 DIAGNOSIS — E7800 Pure hypercholesterolemia, unspecified: Secondary | ICD-10-CM | POA: Insufficient documentation

## 2012-03-31 DIAGNOSIS — Z01812 Encounter for preprocedural laboratory examination: Secondary | ICD-10-CM | POA: Insufficient documentation

## 2012-03-31 DIAGNOSIS — I252 Old myocardial infarction: Secondary | ICD-10-CM | POA: Insufficient documentation

## 2012-03-31 DIAGNOSIS — M5116 Intervertebral disc disorders with radiculopathy, lumbar region: Secondary | ICD-10-CM | POA: Diagnosis present

## 2012-03-31 HISTORY — PX: LUMBAR LAMINECTOMY/DECOMPRESSION MICRODISCECTOMY: SHX5026

## 2012-03-31 LAB — GLUCOSE, CAPILLARY

## 2012-03-31 LAB — ABO/RH: ABO/RH(D): A POS

## 2012-03-31 LAB — TYPE AND SCREEN

## 2012-03-31 SURGERY — LUMBAR LAMINECTOMY/DECOMPRESSION MICRODISCECTOMY 1 LEVEL
Anesthesia: General | Site: Back | Laterality: Left | Wound class: Clean

## 2012-03-31 MED ORDER — BACITRACIN 50000 UNITS IM SOLR
INTRAMUSCULAR | Status: AC
  Filled 2012-03-31: qty 1

## 2012-03-31 MED ORDER — SODIUM CHLORIDE 0.9 % IJ SOLN: 3.0000 mL | INTRAMUSCULAR | Status: DC | PRN

## 2012-03-31 MED ORDER — CYCLOBENZAPRINE HCL 10 MG PO TABS: 10.0000 mg | ORAL_TABLET | Freq: Three times a day (TID) | ORAL | Status: AC | PRN

## 2012-03-31 MED ORDER — NITROGLYCERIN 0.4 MG SL SUBL: 0.4000 mg | SUBLINGUAL_TABLET | SUBLINGUAL | Status: DC | PRN

## 2012-03-31 MED ORDER — PROPOFOL 10 MG/ML IV EMUL
INTRAVENOUS | Status: DC | PRN
  Administered 2012-03-31: 300 mg via INTRAVENOUS

## 2012-03-31 MED ORDER — PHENOL 1.4 % MT LIQD: 1.0000 | OROMUCOSAL | Status: DC | PRN

## 2012-03-31 MED ORDER — 0.9 % SODIUM CHLORIDE (POUR BTL) OPTIME
TOPICAL | Status: DC | PRN
  Administered 2012-03-31: 1000 mL

## 2012-03-31 MED ORDER — ONDANSETRON HCL 4 MG/2ML IJ SOLN
INTRAMUSCULAR | Status: DC | PRN
  Administered 2012-03-31: 4 mg via INTRAVENOUS

## 2012-03-31 MED ORDER — ASPIRIN 325 MG PO TABS
325.0000 mg | ORAL_TABLET | Freq: Every day | ORAL | Status: DC
  Administered 2012-03-31: 325 mg via ORAL
  Filled 2012-03-31: qty 1

## 2012-03-31 MED ORDER — HYDROMORPHONE HCL PF 1 MG/ML IJ SOLN
0.2500 mg | INTRAMUSCULAR | Status: DC | PRN
  Administered 2012-03-31 (×4): 0.5 mg via INTRAVENOUS

## 2012-03-31 MED ORDER — METOPROLOL SUCCINATE ER 50 MG PO TB24
50.0000 mg | ORAL_TABLET | Freq: Every day | ORAL | Status: AC
  Administered 2012-03-31: 50 mg via ORAL
  Filled 2012-03-31: qty 1

## 2012-03-31 MED ORDER — HYDROCODONE-ACETAMINOPHEN 5-325 MG PO TABS: 1.0000 | ORAL_TABLET | ORAL | Status: DC | PRN

## 2012-03-31 MED ORDER — HEMOSTATIC AGENTS (NO CHARGE) OPTIME
TOPICAL | Status: DC | PRN
  Administered 2012-03-31: 1 via TOPICAL

## 2012-03-31 MED ORDER — NITROGLYCERIN 0.6 MG SL SUBL
0.6000 mg | SUBLINGUAL_TABLET | SUBLINGUAL | Status: DC | PRN
  Filled 2012-03-31: qty 100

## 2012-03-31 MED ORDER — METFORMIN HCL 500 MG PO TABS
1000.0000 mg | ORAL_TABLET | Freq: Two times a day (BID) | ORAL | Status: DC
  Administered 2012-03-31: 1000 mg via ORAL
  Filled 2012-03-31 (×2): qty 2

## 2012-03-31 MED ORDER — GLYCOPYRROLATE 0.2 MG/ML IJ SOLN
INTRAMUSCULAR | Status: DC | PRN
  Administered 2012-03-31: .8 mg via INTRAVENOUS

## 2012-03-31 MED ORDER — CEFAZOLIN SODIUM 1-5 GM-% IV SOLN
1.0000 g | Freq: Three times a day (TID) | INTRAVENOUS | Status: DC
  Administered 2012-03-31: 1 g via INTRAVENOUS
  Filled 2012-03-31 (×2): qty 50

## 2012-03-31 MED ORDER — ALUM & MAG HYDROXIDE-SIMETH 200-200-20 MG/5ML PO SUSP: 30.0000 mL | Freq: Four times a day (QID) | ORAL | Status: DC | PRN

## 2012-03-31 MED ORDER — LISINOPRIL 20 MG PO TABS
20.0000 mg | ORAL_TABLET | Freq: Every day | ORAL | Status: DC
  Administered 2012-03-31: 20 mg via ORAL
  Filled 2012-03-31: qty 1

## 2012-03-31 MED ORDER — LACTATED RINGERS IV SOLN
INTRAVENOUS | Status: DC | PRN
  Administered 2012-03-31 (×2): via INTRAVENOUS

## 2012-03-31 MED ORDER — SODIUM CHLORIDE 0.9 % IR SOLN
Status: DC | PRN
  Administered 2012-03-31: 08:00:00

## 2012-03-31 MED ORDER — MENTHOL 3 MG MT LOZG: 1.0000 | LOZENGE | OROMUCOSAL | Status: DC | PRN

## 2012-03-31 MED ORDER — VECURONIUM BROMIDE 10 MG IV SOLR
INTRAVENOUS | Status: DC | PRN
  Administered 2012-03-31: 2 mg via INTRAVENOUS

## 2012-03-31 MED ORDER — SODIUM CHLORIDE 0.9 % IJ SOLN
3.0000 mL | Freq: Two times a day (BID) | INTRAMUSCULAR | Status: DC
  Administered 2012-03-31: 3 mL via INTRAVENOUS

## 2012-03-31 MED ORDER — ISOSORBIDE MONONITRATE ER 30 MG PO TB24
30.0000 mg | ORAL_TABLET | Freq: Every day | ORAL | Status: DC
  Administered 2012-03-31: 30 mg via ORAL
  Filled 2012-03-31: qty 1

## 2012-03-31 MED ORDER — LISINOPRIL-HYDROCHLOROTHIAZIDE 20-12.5 MG PO TABS: 1.0000 | ORAL_TABLET | Freq: Every day | ORAL | Status: DC

## 2012-03-31 MED ORDER — METOPROLOL TARTRATE 50 MG PO TABS
50.0000 mg | ORAL_TABLET | Freq: Two times a day (BID) | ORAL | Status: DC
  Filled 2012-03-31 (×2): qty 1

## 2012-03-31 MED ORDER — LIDOCAINE HCL (CARDIAC) 20 MG/ML IV SOLN
INTRAVENOUS | Status: DC | PRN
  Administered 2012-03-31: 80 mg via INTRAVENOUS

## 2012-03-31 MED ORDER — SODIUM CHLORIDE 0.9 % IV SOLN
INTRAVENOUS | Status: AC
  Filled 2012-03-31: qty 500

## 2012-03-31 MED ORDER — HYDROMORPHONE HCL PF 1 MG/ML IJ SOLN
INTRAMUSCULAR | Status: AC
  Filled 2012-03-31: qty 1

## 2012-03-31 MED ORDER — ONDANSETRON HCL 4 MG/2ML IJ SOLN: 4.0000 mg | Freq: Once | INTRAMUSCULAR | Status: DC | PRN

## 2012-03-31 MED ORDER — NEOSTIGMINE METHYLSULFATE 1 MG/ML IJ SOLN
INTRAMUSCULAR | Status: DC | PRN
  Administered 2012-03-31: 5 mg via INTRAVENOUS

## 2012-03-31 MED ORDER — FENTANYL CITRATE 0.05 MG/ML IJ SOLN
INTRAMUSCULAR | Status: DC | PRN
  Administered 2012-03-31: 150 ug via INTRAVENOUS
  Administered 2012-03-31 (×2): 50 ug via INTRAVENOUS

## 2012-03-31 MED ORDER — MIDAZOLAM HCL 5 MG/5ML IJ SOLN
INTRAMUSCULAR | Status: DC | PRN
  Administered 2012-03-31: 2 mg via INTRAVENOUS

## 2012-03-31 MED ORDER — THROMBIN 5000 UNITS EX KIT
PACK | CUTANEOUS | Status: DC | PRN
  Administered 2012-03-31 (×2): 5000 [IU] via TOPICAL

## 2012-03-31 MED ORDER — DEXAMETHASONE SODIUM PHOSPHATE 10 MG/ML IJ SOLN
10.0000 mg | INTRAMUSCULAR | Status: AC
  Administered 2012-03-31: 10 mg via INTRAVENOUS
  Filled 2012-03-31: qty 1

## 2012-03-31 MED ORDER — OXYCODONE-ACETAMINOPHEN 5-325 MG PO TABS
1.0000 | ORAL_TABLET | ORAL | Status: DC | PRN
  Administered 2012-03-31 (×2): 2 via ORAL
  Filled 2012-03-31 (×2): qty 2

## 2012-03-31 MED ORDER — ROCURONIUM BROMIDE 100 MG/10ML IV SOLN
INTRAVENOUS | Status: DC | PRN
  Administered 2012-03-31: 50 mg via INTRAVENOUS

## 2012-03-31 MED ORDER — BUPIVACAINE HCL (PF) 0.25 % IJ SOLN
INTRAMUSCULAR | Status: DC | PRN
  Administered 2012-03-31: 20 mL

## 2012-03-31 MED ORDER — PANTOPRAZOLE SODIUM 40 MG PO TBEC
40.0000 mg | DELAYED_RELEASE_TABLET | Freq: Every day | ORAL | Status: DC
  Administered 2012-03-31: 40 mg via ORAL
  Filled 2012-03-31: qty 1

## 2012-03-31 MED ORDER — HYDROMORPHONE HCL PF 1 MG/ML IJ SOLN
0.5000 mg | INTRAMUSCULAR | Status: DC | PRN
  Administered 2012-03-31: 1 mg via INTRAVENOUS
  Filled 2012-03-31: qty 1

## 2012-03-31 MED ORDER — SODIUM CHLORIDE 0.9 % IV SOLN: 250.0000 mL | INTRAVENOUS | Status: DC

## 2012-03-31 MED ORDER — OXYCODONE HCL 15 MG PO TABS: 15.0000 mg | ORAL_TABLET | Freq: Four times a day (QID) | ORAL | Status: AC | PRN

## 2012-03-31 MED ORDER — FLEET ENEMA 7-19 GM/118ML RE ENEM
1.0000 | ENEMA | Freq: Once | RECTAL | Status: DC | PRN
  Filled 2012-03-31: qty 1

## 2012-03-31 MED ORDER — LIDOCAINE HCL 4 % MT SOLN
OROMUCOSAL | Status: DC | PRN
  Administered 2012-03-31: 4 mL via TOPICAL

## 2012-03-31 MED ORDER — ONDANSETRON HCL 4 MG/2ML IJ SOLN: 4.0000 mg | INTRAMUSCULAR | Status: DC | PRN

## 2012-03-31 MED ORDER — ZOLPIDEM TARTRATE 5 MG PO TABS: 10.0000 mg | ORAL_TABLET | Freq: Every evening | ORAL | Status: DC | PRN

## 2012-03-31 MED ORDER — SENNA 8.6 MG PO TABS
1.0000 | ORAL_TABLET | Freq: Two times a day (BID) | ORAL | Status: DC
  Administered 2012-03-31: 8.6 mg via ORAL
  Filled 2012-03-31: qty 1

## 2012-03-31 MED ORDER — ACETAMINOPHEN 650 MG RE SUPP: 650.0000 mg | RECTAL | Status: DC | PRN

## 2012-03-31 MED ORDER — HYDROCHLOROTHIAZIDE 12.5 MG PO CAPS
12.5000 mg | ORAL_CAPSULE | Freq: Every day | ORAL | Status: DC
  Administered 2012-03-31: 12.5 mg via ORAL
  Filled 2012-03-31: qty 1

## 2012-03-31 MED ORDER — POLYETHYLENE GLYCOL 3350 17 G PO PACK
17.0000 g | PACK | Freq: Every day | ORAL | Status: DC | PRN
  Filled 2012-03-31: qty 1

## 2012-03-31 MED ORDER — BISACODYL 10 MG RE SUPP: 10.0000 mg | Freq: Every day | RECTAL | Status: DC | PRN

## 2012-03-31 MED ORDER — ACETAMINOPHEN 325 MG PO TABS: 650.0000 mg | ORAL_TABLET | ORAL | Status: DC | PRN

## 2012-03-31 MED ORDER — SIMVASTATIN 20 MG PO TABS
20.0000 mg | ORAL_TABLET | Freq: Every day | ORAL | Status: DC
  Administered 2012-03-31: 20 mg via ORAL
  Filled 2012-03-31: qty 1

## 2012-03-31 MED ORDER — DIPHENHYDRAMINE HCL 25 MG PO CAPS
25.0000 mg | ORAL_CAPSULE | Freq: Four times a day (QID) | ORAL | Status: DC | PRN
  Administered 2012-03-31: 25 mg via ORAL
  Filled 2012-03-31: qty 1

## 2012-03-31 MED ORDER — KETOROLAC TROMETHAMINE 30 MG/ML IJ SOLN
30.0000 mg | Freq: Four times a day (QID) | INTRAMUSCULAR | Status: DC
  Administered 2012-03-31: 30 mg via INTRAVENOUS
  Filled 2012-03-31: qty 1

## 2012-03-31 MED ORDER — KETOROLAC TROMETHAMINE 30 MG/ML IJ SOLN
INTRAMUSCULAR | Status: DC | PRN
  Administered 2012-03-31: 30 mg via INTRAVENOUS

## 2012-03-31 MED ORDER — ASPIRIN BUFFERED 325 MG PO TABS: 325.0000 mg | ORAL_TABLET | Freq: Every day | ORAL | Status: DC

## 2012-03-31 MED ORDER — CYCLOBENZAPRINE HCL 10 MG PO TABS
10.0000 mg | ORAL_TABLET | Freq: Three times a day (TID) | ORAL | Status: DC | PRN
  Administered 2012-03-31: 10 mg via ORAL
  Filled 2012-03-31: qty 1

## 2012-03-31 SURGICAL SUPPLY — 49 items
ADH SKN CLS APL DERMABOND .7 (GAUZE/BANDAGES/DRESSINGS) ×1
APL SKNCLS STERI-STRIP NONHPOA (GAUZE/BANDAGES/DRESSINGS) ×2
BAG DECANTER FOR FLEXI CONT (MISCELLANEOUS) ×2 IMPLANT
BENZOIN TINCTURE PRP APPL 2/3 (GAUZE/BANDAGES/DRESSINGS) ×3 IMPLANT
BLADE SURG ROTATE 9660 (MISCELLANEOUS) ×1 IMPLANT
BRUSH SCRUB EZ PLAIN DRY (MISCELLANEOUS) ×2 IMPLANT
BUR CUTTER 7.0 ROUND (BURR) ×2 IMPLANT
CANISTER SUCTION 2500CC (MISCELLANEOUS) ×2 IMPLANT
CLOTH BEACON ORANGE TIMEOUT ST (SAFETY) ×2 IMPLANT
CONT SPEC 4OZ CLIKSEAL STRL BL (MISCELLANEOUS) ×2 IMPLANT
DECANTER SPIKE VIAL GLASS SM (MISCELLANEOUS) ×1 IMPLANT
DERMABOND ADVANCED (GAUZE/BANDAGES/DRESSINGS) ×1
DERMABOND ADVANCED .7 DNX12 (GAUZE/BANDAGES/DRESSINGS) ×1 IMPLANT
DRAPE LAPAROTOMY 100X72X124 (DRAPES) ×2 IMPLANT
DRAPE MICROSCOPE LEICA (MISCELLANEOUS) ×1 IMPLANT
DRAPE MICROSCOPE ZEISS OPMI (DRAPES) ×1 IMPLANT
DRAPE POUCH INSTRU U-SHP 10X18 (DRAPES) ×2 IMPLANT
DRAPE PROXIMA HALF (DRAPES) IMPLANT
DRAPE SURG 17X23 STRL (DRAPES) ×4 IMPLANT
ELECT REM PT RETURN 9FT ADLT (ELECTROSURGICAL) ×2
ELECTRODE REM PT RTRN 9FT ADLT (ELECTROSURGICAL) ×1 IMPLANT
GAUZE SPONGE 4X4 16PLY XRAY LF (GAUZE/BANDAGES/DRESSINGS) IMPLANT
GLOVE ECLIPSE 8.5 STRL (GLOVE) ×2 IMPLANT
GLOVE EXAM NITRILE LRG STRL (GLOVE) IMPLANT
GLOVE EXAM NITRILE MD LF STRL (GLOVE) IMPLANT
GLOVE EXAM NITRILE XL STR (GLOVE) IMPLANT
GLOVE EXAM NITRILE XS STR PU (GLOVE) IMPLANT
GOWN BRE IMP SLV AUR LG STRL (GOWN DISPOSABLE) ×1 IMPLANT
GOWN BRE IMP SLV AUR XL STRL (GOWN DISPOSABLE) ×2 IMPLANT
GOWN STRL REIN 2XL LVL4 (GOWN DISPOSABLE) ×1 IMPLANT
KIT BASIN OR (CUSTOM PROCEDURE TRAY) ×2 IMPLANT
KIT ROOM TURNOVER OR (KITS) ×2 IMPLANT
NDL SPNL 22GX3.5 QUINCKE BK (NEEDLE) ×1 IMPLANT
NEEDLE HYPO 22GX1.5 SAFETY (NEEDLE) ×2 IMPLANT
NEEDLE SPNL 22GX3.5 QUINCKE BK (NEEDLE) ×2 IMPLANT
NS IRRIG 1000ML POUR BTL (IV SOLUTION) ×2 IMPLANT
PACK LAMINECTOMY NEURO (CUSTOM PROCEDURE TRAY) ×2 IMPLANT
PAD ARMBOARD 7.5X6 YLW CONV (MISCELLANEOUS) ×6 IMPLANT
RUBBERBAND STERILE (MISCELLANEOUS) ×4 IMPLANT
SPONGE GAUZE 4X4 12PLY (GAUZE/BANDAGES/DRESSINGS) ×2 IMPLANT
SPONGE SURGIFOAM ABS GEL SZ50 (HEMOSTASIS) ×2 IMPLANT
STRIP CLOSURE SKIN 1/2X4 (GAUZE/BANDAGES/DRESSINGS) ×2 IMPLANT
SUT VIC AB 2-0 CT1 18 (SUTURE) ×2 IMPLANT
SUT VIC AB 3-0 SH 8-18 (SUTURE) ×2 IMPLANT
SYR 20ML ECCENTRIC (SYRINGE) ×1 IMPLANT
TAPE CLOTH SURG 4X10 WHT LF (GAUZE/BANDAGES/DRESSINGS) ×1 IMPLANT
TOWEL OR 17X24 6PK STRL BLUE (TOWEL DISPOSABLE) ×2 IMPLANT
TOWEL OR 17X26 10 PK STRL BLUE (TOWEL DISPOSABLE) ×2 IMPLANT
WATER STERILE IRR 1000ML POUR (IV SOLUTION) ×2 IMPLANT

## 2012-03-31 NOTE — Op Note (Signed)
Date of procedure: 03/31/2012  Date of dictation: Same  Service: Neurosurgery  Preoperative diagnosis: Left paracentral L2-3 herniated nucleus pulposus  Postoperative diagnosis: Same  Procedure Name: Left L2-3 laminotomy microdiscectomy.  Surgeon:Rennee Coyne A.Willian Donson, M.D.  Asst. Surgeon: Jule Ser   Anesthesia: General  Indication: 46 year old male with intractable back pain with bilateral or trim his symptoms. Workup demonstrates evidence of an L2-3 disc herniation with compression upon the thecal sac. The disc herniation is paracentral towards the left. Plan is for a left L2-3 laminotomy microdiscectomy.  Operative note: After induction of anesthesia, patient positioned prone onto a Wilson frame and appropriately padded. Lumbar region prepped and draped. Incision made overlying the L2-3 disc space. Carried down sharply in the midline. Subperiosteal dissection performed on the left. Deep self-retaining traction placed. X-ray taken level confirmed. Laminotomy performed using high-speed drill and Kerrison rongeurs. Ligament limb elevated and resected in piecemeal fashion using Kerrison rongeurs. Underlying thecal sac and L3 nerve root identified. Microscope brought field and used for microdissection. Thecal sac and L3 nerve root gently mobilized track towards the midline. Disc herniation readily apparent. There was a contained fragment which was dissected free and removed using blunt nerve hooks and pituitary rongeurs. The remainder the annulus is intact. There is no evidence of other significant disc herniation. It was not felt prudent to perform an interdiscal discectomy given the anatomic state found at surgery. Wound is irrigated out solution. Gelfoam placed topically for hemostasis. Retractor system and microscope removed. Hemostasis of the muscle achieved with electric cautery. Wound closed in layers. Steri-Strips and sterile dressing applied. No apparent complications. Patient tolerated procedure  well and returned to the recovery room.

## 2012-03-31 NOTE — Brief Op Note (Signed)
03/31/2012  9:06 AM  PATIENT:  Aniceto Boss Prind[place]le  46 y.o. male  PRE-OPERATIVE DIAGNOSIS:  HNP  POST-OPERATIVE DIAGNOSIS:  HNP  PROCEDURE:  Procedure(s) (LRB): LUMBAR LAMINECTOMY/DECOMPRESSION MICRODISCECTOMY 1 LEVEL (Left)  SURGEON:  Surgeon(s) and Role:    * Temple Pacini, MD - Primary    * Hewitt Shorts, MD - Assisting  PHYSICIAN ASSISTANT:   ASSISTANTS: Dr. Jule Ser   ANESTHESIA:   general  EBL:  Total I/O In: 1000 [I.V.:1000] Out: -   BLOOD ADMINISTERED:none  DRAINS: none   LOCAL MEDICATIONS USED:  MARCAINE     SPECIMEN:  No Specimen  DISPOSITION OF SPECIMEN:  N/A  COUNTS:  YES  TOURNIQUET:  * No tourniquets in log *  DICTATION: .Dragon Dictation  PLAN OF CARE: Admit for overnight observation  PATIENT DISPOSITION:  PACU - hemodynamically stable.   Delay start of Pharmacological VTE agent (>24hrs) due to surgical blood loss or risk of bleeding: yes

## 2012-03-31 NOTE — Discharge Instructions (Signed)
Wound Care Keep incision covered and dry for one week.  If you shower prior to then, cover incision with plastic wrap.  You may remove outer bandage after one week and shower.  Do not put any creams, lotions, or ointments on incision. Leave steri-strips on back.  They will fall off by themselves. Activity Walk each and every day, increasing distance each day. No lifting greater than 5 lbs.  Avoid excessive neck motion. No driving for 2 weeks; may ride as a passenger locally. If provided with back brace, wear when out of bed.  It is not necessary to wear brace in bed. Diet Resume your normal diet.  Return to Work Will be discussed at you follow up appointment. Call Your Doctor If Any of These Occur Redness, drainage, or swelling at the wound.  Temperature greater than 101 degrees. Severe pain not relieved by pain medication. Incision starts to come apart. Follow Up Appt Call today for appointment in 1-2 weeks [phone]) or for problems.  If you have any hardware placed in your spine, you will need an x-ray before your appointment. 

## 2012-03-31 NOTE — Anesthesia Preprocedure Evaluation (Signed)
Anesthesia Evaluation  Patient identified by MRN, date of birth, ID band Patient awake    Reviewed: Allergy & Precautions, H&P , NPO status , Patient's Chart, lab work & pertinent test results  Airway Mallampati: I TM Distance: >3 FB Neck ROM: full    Dental   Pulmonary shortness of breath, COPD         Cardiovascular hypertension, + angina + CAD and + Past MI Rhythm:regular Rate:Normal     Neuro/Psych  Headaches, PSYCHIATRIC DISORDERS  Neuromuscular disease    GI/Hepatic GERD-  ,  Endo/Other  Diabetes mellitus-, Type 2, Oral Hypoglycemic Agents  Renal/GU      Musculoskeletal   Abdominal   Peds  Hematology   Anesthesia Other Findings   Reproductive/Obstetrics                           Anesthesia Physical Anesthesia Plan  ASA: III  Anesthesia Plan: General   Post-op Pain Management:    Induction: Intravenous  Airway Management Planned: Oral ETT  Additional Equipment:   Intra-op Plan:   Post-operative Plan: Extubation in OR  Informed Consent: I have reviewed the patients History and Physical, chart, labs and discussed the procedure including the risks, benefits and alternatives for the proposed anesthesia with the patient or authorized representative who has indicated his/her understanding and acceptance.     Plan Discussed with: CRNA, Anesthesiologist and Surgeon  Anesthesia Plan Comments:         Anesthesia Quick Evaluation

## 2012-03-31 NOTE — H&P (Signed)
Andrew West is an 46 y.o. male.   Chief Complaint: Back pain HPI: 46 year old male with severe back pain with radiation to his lower extremities right greater than left. Patient's failed conservative management. He is requiring high-dose narcotics for pain control. He denies any numbness paresthesias or weakness. Workup has demonstrated evidence of a left paracentral disc herniation L2-3 compression the thecal sac on. Patient is noted for laminotomy microdiscectomy on the left at L2-3.  Past Medical History  Diagnosis Date  . Myocardial infarction 2 yrs ago  . Heart disease   . Hypercholesteremia   . Coronary artery disease   . NSTEMI (non-ST elevated myocardial infarction) 10/18/2011  . CAD (coronary artery disease), native coronary artery 04/18/2008  . Diabetes mellitus     Takes Metformin  . Hypertension     Takes Lisinopril   . GERD (gastroesophageal reflux disease)     Takes Dexilant  . Angina     Takes Isosorbide & Nitroglycerin  . Headache   . Pneumonia     approx 2 years ago  . Bronchitis     hx of  . Shortness of breath     Occurs while laying down at times    Past Surgical History  Procedure Date  . Coronary angioplasty with stent placement 09/01/11    1 stent  . Colonoscopy w/ polypectomy     Family History  Problem Relation Age of Onset  . Anesthesia problems Neg Hx   . Hypotension Neg Hx   . Malignant hyperthermia Neg Hx   . Pseudochol deficiency Neg Hx    Social History:  reports that he has been smoking.  He does not have any smokeless tobacco history on file. He reports that he does not drink alcohol or use illicit drugs.  Allergies:  Allergies  Allergen Reactions  . Iohexol      Desc: PT BECAME SOB AND CHEST TIGHTNESS AFTER CONTRAST INJECTION.  [name],RT-RCT., Onset Date: [id number]   . Vicodin (Hydrocodone-Acetaminophen) Itching    Medications Prior to Admission  Medication Sig Dispense Refill  . aspirin 325 MG buffered tablet Take  325 mg by mouth daily.        . clopidogrel (PLAVIX) 75 MG tablet Take 75 mg by mouth daily.      . cyclobenzaprine (FLEXERIL) 10 MG tablet Take 10 mg by mouth 2 (two) times daily as needed. For muscle pain      . dexlansoprazole (DEXILANT) 60 MG capsule Take 60 mg by mouth daily.      . isosorbide mononitrate (IMDUR) 30 MG 24 hr tablet Take 30 mg by mouth daily.        Marland Kitchen lisinopril-hydrochlorothiazide (PRINZIDE,ZESTORETIC) 20-12.5 MG per tablet Take 1 tablet by mouth daily.        . metFORMIN (GLUCOPHAGE) 1000 MG tablet Take 1,000 mg by mouth 2 (two) times daily with a meal. Restart this medication on Thursday, Oct 21, 2011.      . metoprolol (LOPRESSOR) 50 MG tablet Take 50 mg by mouth 2 (two) times daily.       Marland Kitchen oxyCODONE-acetaminophen (PERCOCET) 10-325 MG per tablet Take 1 tablet by mouth every 4 (four) hours as needed. For pain      . pravastatin (PRAVACHOL) 40 MG tablet Take 40 mg by mouth at bedtime.        . nitroGLYCERIN (NITROSTAT) 0.6 MG SL tablet Place 0.6 mg under the tongue every 5 (five) minutes as needed. As needed for chest pain       .  zolpidem (AMBIEN) 10 MG tablet Take 10 mg by mouth at bedtime as needed. For sleep        Results for orders placed during the hospital encounter of 03/31/12 (from the past 48 hour(s))  GLUCOSE, CAPILLARY     Status: Abnormal   Collection Time   03/31/12  6:10 AM      Component Value Range Comment   Glucose-Capillary 132 (*) 70 - 99 (mg/dL)    Dg Chest 2 View  01/05/[birth year]  *RADIOLOGY REPORT*  Clinical Data: Preoperative chest radiograph.  CHEST - 2 VIEW  Comparison: Chest radiograph of 12/19/2011  Findings: The lungs are well-aerated.  Chronic vascular congestion is noted, with underlying mild chronic interstitial changes.  There is no evidence of focal opacification, pleural effusion or pneumothorax.  The heart is borderline normal in size; the mediastinal contour is within normal limits.  No acute osseous abnormalities are seen.  IMPRESSION:  Chronic vascular congestion noted; no acute cardiopulmonary process seen.  Original Report Authenticated By: Tonia Ghent, M.D.    Review of Systems  Constitutional: Negative.   HENT: Negative.   Eyes: Negative.   Respiratory: Negative.   Cardiovascular: Negative.   Gastrointestinal: Negative.   Genitourinary: Negative.   Musculoskeletal: Negative.   Skin: Negative.   Neurological: Negative.   Endo/Heme/Allergies: Negative.   Psychiatric/Behavioral: Negative.     Blood pressure 131/87, pulse 78, temperature 98 F (36.7 C), temperature source Oral, resp. rate 18, SpO2 96.00%. Physical Exam  Constitutional: He is oriented to person, place, and time. He appears well-developed and well-nourished.  HENT:  Head: Normocephalic and atraumatic.  Right Ear: External ear normal.  Left Ear: External ear normal.  Nose: Nose normal.  Mouth/Throat: Oropharynx is clear and moist.  Eyes: Conjunctivae and EOM are normal. Pupils are equal, round, and reactive to light.  Neck: Normal range of motion. Neck supple. No tracheal deviation present. No thyromegaly present.  Cardiovascular: Normal rate, regular rhythm, normal heart sounds and intact distal pulses.   Respiratory: Effort normal and breath sounds normal. No respiratory distress. He has no wheezes. He has no rales.  GI: Soft. Bowel sounds are normal.  Musculoskeletal: Normal range of motion. He exhibits no edema and no tenderness.  Neurological: He is alert and oriented to person, place, and time. He has normal reflexes.  Skin: Skin is warm and dry.  Psychiatric: He has a normal mood and affect. His behavior is normal. Judgment and thought content normal.     Assessment/Plan Left paracentral herniated nucleus pulposus with radiculopathy. Plan left L2-3 laminotomy microdiscectomy. Risks and benefits explained. Patient wishes to proceed.  Tiwanda Threats A 03/31/2012, 7:39 AM

## 2012-03-31 NOTE — Anesthesia Procedure Notes (Signed)
Procedure Name: Intubation Date/Time: 03/31/2012 7:54 AM Performed by: Margaree Mackintosh Pre-anesthesia Checklist: Patient identified, Timeout performed, Emergency Drugs available, Suction available and Patient being monitored Patient Re-evaluated:Patient Re-evaluated prior to inductionOxygen Delivery Method: Circle system utilized Preoxygenation: Pre-oxygenation with 100% oxygen Intubation Type: IV induction Ventilation: Oral airway inserted - appropriate to patient size and Mask ventilation without difficulty Laryngoscope Size: Mac and 4 Grade View: Grade II Tube type: Oral Tube size: 7.5 mm Number of attempts: 1 Airway Equipment and Method: Stylet and LTA kit utilized Placement Confirmation: ETT inserted through vocal cords under direct vision,  positive ETCO2 and breath sounds checked- equal and bilateral Secured at: 23 cm Tube secured with: Tape Dental Injury: Teeth and Oropharynx as per pre-operative assessment

## 2012-03-31 NOTE — Transfer of Care (Signed)
Immediate Anesthesia Transfer of Care Note  Patient: Andrew West  Procedure(s) Performed: Procedure(s) (LRB): LUMBAR LAMINECTOMY/DECOMPRESSION MICRODISCECTOMY 1 LEVEL (Left)  Patient Location: PACU  Anesthesia Type: General  Level of Consciousness: awake, alert  and patient cooperative  Airway & Oxygen Therapy: Patient Spontanous Breathing and Patient connected to face mask oxygen  Post-op Assessment: Report given to PACU RN, Post -op Vital signs reviewed and stable and Patient moving all extremities  Post vital signs: Reviewed and stable  Complications: No apparent anesthesia complications

## 2012-03-31 NOTE — Anesthesia Postprocedure Evaluation (Signed)
  Anesthesia Post-op Note  Patient: Andrew West  Procedure(s) Performed: Procedure(s) (LRB): LUMBAR LAMINECTOMY/DECOMPRESSION MICRODISCECTOMY 1 LEVEL (Left)  Patient Location: PACU  Anesthesia Type: General  Level of Consciousness: awake, alert , oriented and patient cooperative  Airway and Oxygen Therapy: Patient Spontanous Breathing and Patient connected to nasal cannula oxygen  Post-op Pain: mild  Post-op Assessment: Post-op Vital signs reviewed, Patient's Cardiovascular Status Stable, Respiratory Function Stable, Patent Airway, No signs of Nausea or vomiting and Pain level controlled  Post-op Vital Signs: stable  Complications: No apparent anesthesia complications

## 2012-03-31 NOTE — Discharge Summary (Signed)
Physician Discharge Summary  Patient ID: Andrew West MRN: [id number] DOB/AGE: 12/03/1965 46 y.o.  Admit date: 03/31/2012 Discharge date: 03/31/2012  Admission Diagnoses:  Discharge Diagnoses:  Principal Problem:  *Lumbar disc herniation with radiculopathy   Discharged Condition: good  Hospital Course: Patient in the hospital oriented when an uncomplicated left-sided L2-3 laminotomy microdiscectomy postoperative done quite well. Preoperative back in r pain have improved. Neurologically is intact. Plan for discharge home. Consults:   Significant Diagnostic Studies:   Treatments:   Discharge Exam: Blood pressure 124/75, pulse 77, temperature 98 F (36.7 C), temperature source Oral, resp. rate 14, height 5\' 8"  (1.727 m), weight 125.283 kg (276 lb 3.2 oz), SpO2 99.00%. Awake and alert oriented and appropriate. Cranial nerve function is intact. Motor and sensory function extremities is normal. Wound clean dry and intact. Chest and abdomen benign.  Disposition: 01-Home or Self Care   Medication List  As of 03/31/2012  9:06 PM   STOP taking these medications         oxyCODONE-acetaminophen 10-325 MG per tablet         TAKE these medications         aspirin 325 MG buffered tablet   Take 325 mg by mouth daily.      clopidogrel 75 MG tablet   Commonly known as: PLAVIX   Take 75 mg by mouth daily.      cyclobenzaprine 10 MG tablet   Commonly known as: FLEXERIL   Take 10 mg by mouth 2 (two) times daily as needed. For muscle pain      cyclobenzaprine 10 MG tablet   Commonly known as: FLEXERIL   Take 1 tablet (10 mg total) by mouth 3 (three) times daily as needed for muscle spasms.      DEXILANT 60 MG capsule   Generic drug: dexlansoprazole   Take 60 mg by mouth daily.      isosorbide mononitrate 30 MG 24 hr tablet   Commonly known as: IMDUR   Take 30 mg by mouth daily.      lisinopril-hydrochlorothiazide 20-12.5 MG per tablet   Commonly known as:  PRINZIDE,ZESTORETIC   Take 1 tablet by mouth daily.      metFORMIN 1000 MG tablet   Commonly known as: GLUCOPHAGE   Take 1,000 mg by mouth 2 (two) times daily with a meal. Restart this medication on Thursday, Oct 21, 2011.      metoprolol 50 MG tablet   Commonly known as: LOPRESSOR   Take 50 mg by mouth 2 (two) times daily.      nitroGLYCERIN 0.6 MG SL tablet   Commonly known as: NITROSTAT   Place 0.6 mg under the tongue every 5 (five) minutes as needed. As needed for chest pain      oxyCODONE 15 MG immediate release tablet   Commonly known as: ROXICODONE   Take 1-2 tablets (15-30 mg total) by mouth every 6 (six) hours as needed for pain.      pravastatin 40 MG tablet   Commonly known as: PRAVACHOL   Take 40 mg by mouth at bedtime.      zolpidem 10 MG tablet   Commonly known as: AMBIEN   Take 10 mg by mouth at bedtime as needed. For sleep           Follow-up Information    Call Marthann Abshier A, MD. (ask for Crystal)    Contact information:   [address] [place] [address] [phone]  Signed: Elania Crowl A 03/31/2012, 9:06 PM

## 2012-03-31 NOTE — Progress Notes (Signed)
PT. UP AD LIB IN HALL WITH STEADY GAIT, TOLERATING DIET AND PO PAIN MEDICATIONS. PT. VOIDING WITHOUT DIFFICULTIES. V/S AT 2058 T=98.0, B/P=124/75, P=77, R=14, SAT 99% ON ROOM AIR. PT. VERBALIZED UNDERSTANDING OF DISCHARGE ORDERS. FAMILY ASSISTING PT. HOME. FAMILY AND STAFF ASSISTING PT. TO CAR. NO DISTRESS NOTED.  CHRIS Darlis Wragg RN

## 2012-03-31 NOTE — Preoperative (Signed)
Beta Blockers   Reason not to administer Beta Blockers:Not Applicable, took metopolol this am

## 2012-04-03 ENCOUNTER — Encounter (HOSPITAL_COMMUNITY): Payer: Self-pay | Admitting: Neurosurgery

## 2012-04-03 LAB — GLUCOSE, CAPILLARY: Glucose-Capillary: 108 mg/dL — ABNORMAL HIGH (ref 70–99)

## 2012-08-28 ENCOUNTER — Other Ambulatory Visit: Payer: Self-pay | Admitting: Internal Medicine

## 2012-09-07 ENCOUNTER — Other Ambulatory Visit: Payer: Self-pay | Admitting: Internal Medicine

## 2012-12-09 ENCOUNTER — Encounter (HOSPITAL_COMMUNITY): Payer: Self-pay | Admitting: Emergency Medicine

## 2012-12-09 ENCOUNTER — Emergency Department (HOSPITAL_COMMUNITY)
Admission: EM | Admit: 2012-12-09 | Discharge: 2012-12-09 | Disposition: A | Discharge status: Home / Self Care | Payer: BC Managed Care – PPO | Attending: Emergency Medicine | Admitting: Emergency Medicine

## 2012-12-09 DIAGNOSIS — F17200 Nicotine dependence, unspecified, uncomplicated: Secondary | ICD-10-CM | POA: Insufficient documentation

## 2012-12-09 DIAGNOSIS — E7800 Pure hypercholesterolemia, unspecified: Secondary | ICD-10-CM | POA: Insufficient documentation

## 2012-12-09 DIAGNOSIS — M545 Low back pain, unspecified: Secondary | ICD-10-CM | POA: Insufficient documentation

## 2012-12-09 DIAGNOSIS — Z9889 Other specified postprocedural states: Secondary | ICD-10-CM | POA: Insufficient documentation

## 2012-12-09 DIAGNOSIS — Z79899 Other long term (current) drug therapy: Secondary | ICD-10-CM | POA: Insufficient documentation

## 2012-12-09 DIAGNOSIS — R209 Unspecified disturbances of skin sensation: Secondary | ICD-10-CM | POA: Insufficient documentation

## 2012-12-09 DIAGNOSIS — I2510 Atherosclerotic heart disease of native coronary artery without angina pectoris: Secondary | ICD-10-CM | POA: Insufficient documentation

## 2012-12-09 DIAGNOSIS — Z8709 Personal history of other diseases of the respiratory system: Secondary | ICD-10-CM | POA: Insufficient documentation

## 2012-12-09 DIAGNOSIS — I10 Essential (primary) hypertension: Secondary | ICD-10-CM | POA: Insufficient documentation

## 2012-12-09 DIAGNOSIS — K219 Gastro-esophageal reflux disease without esophagitis: Secondary | ICD-10-CM | POA: Insufficient documentation

## 2012-12-09 DIAGNOSIS — E119 Type 2 diabetes mellitus without complications: Secondary | ICD-10-CM | POA: Insufficient documentation

## 2012-12-09 DIAGNOSIS — Z8701 Personal history of pneumonia (recurrent): Secondary | ICD-10-CM | POA: Insufficient documentation

## 2012-12-09 DIAGNOSIS — Z8679 Personal history of other diseases of the circulatory system: Secondary | ICD-10-CM | POA: Insufficient documentation

## 2012-12-09 DIAGNOSIS — I252 Old myocardial infarction: Secondary | ICD-10-CM | POA: Insufficient documentation

## 2012-12-09 DIAGNOSIS — Z7982 Long term (current) use of aspirin: Secondary | ICD-10-CM | POA: Insufficient documentation

## 2012-12-09 MED ORDER — ONDANSETRON 4 MG PO TBDP
4.0000 mg | ORAL_TABLET | Freq: Once | ORAL | Status: AC
  Administered 2012-12-09: 4 mg via ORAL
  Filled 2012-12-09: qty 1

## 2012-12-09 MED ORDER — FENTANYL CITRATE 0.05 MG/ML IJ SOLN
50.0000 ug | Freq: Once | INTRAMUSCULAR | Status: AC
  Administered 2012-12-09: 50 ug via INTRAMUSCULAR
  Filled 2012-12-09: qty 2

## 2012-12-09 MED ORDER — OXYCODONE-ACETAMINOPHEN 5-325 MG PO TABS: 1.0000 | ORAL_TABLET | Freq: Four times a day (QID) | ORAL | Status: DC | PRN

## 2012-12-09 NOTE — ED Provider Notes (Signed)
Medical screening examination/treatment/procedure(s) were performed by non-physician practitioner and as supervising physician I was immediately available for consultation/collaboration.  Juliet Rude. Rubin Payor, MD 12/09/12 2329

## 2012-12-09 NOTE — ED Notes (Signed)
Patient states that he has had surgery to his lower back and since yesterday he has had increased pain to the same area

## 2012-12-09 NOTE — ED Provider Notes (Signed)
History  Scribed for Marlon Pel, PA-C/ Juliet Rude. Pickering, MD, the patient was seen in room WTR5/WTR5. This chart was scribed by Candelaria Stagers. The patient's care started at 6:53 PM   CSN: [id number]  Arrival date & time 12/09/12  1656   First MD Initiated Contact with Patient 12/09/12 1818      Chief Complaint  Patient presents with  . Back Pain     The history is provided by the patient. No language interpreter was used.   Andrew West is a 47 y.o. male who presents to the Emergency Department complaining of lower back pain that started several weeks ago and became worse today.  Pt had back surgery about nine months ago.  He has not contacted his Careers adviser.  He describes the pain as a burning pain.  He reports that he is also experiencing numbness of the left leg.  He denies incontinence.  He denies new injury or trauma.  Pt is ambulatory, but states that when he feels leg numbness he limps.  Pt has taken percocet for the pain which helps somewhat.  He is now out of the percocet.  Pt has h/o diabetes.       Past Medical History  Diagnosis Date  . Myocardial infarction 2 yrs ago  . Heart disease   . Hypercholesteremia   . Coronary artery disease   . NSTEMI (non-ST elevated myocardial infarction) 10/18/2011  . CAD (coronary artery disease), native coronary artery 04/18/2008  . Diabetes mellitus     Takes Metformin  . Hypertension     Takes Lisinopril   . GERD (gastroesophageal reflux disease)     Takes Dexilant  . Angina     Takes Isosorbide & Nitroglycerin  . Headache   . Pneumonia     approx 2 years ago  . Bronchitis     hx of  . Shortness of breath     Occurs while laying down at times    Past Surgical History  Procedure Date  . Coronary angioplasty with stent placement 09/01/11    1 stent  . Colonoscopy w/ polypectomy   . Lumbar laminectomy/decompression microdiscectomy 03/31/2012    Procedure: LUMBAR LAMINECTOMY/DECOMPRESSION MICRODISCECTOMY 1 LEVEL;   Surgeon: Temple Pacini, MD;  Location: [facility];  Service: Neurosurgery;  Laterality: Left;    Family History  Problem Relation Age of Onset  . Anesthesia problems Neg Hx   . Hypotension Neg Hx   . Malignant hyperthermia Neg Hx   . Pseudochol deficiency Neg Hx     History  Substance Use Topics  . Smoking status: Current Some Day Smoker  . Smokeless tobacco: Not on file     Comment: Pt st's he smoked 1 cig. tody to see if it would ease the pain  . Alcohol Use: No      Review of Systems  Constitutional: Negative for fatigue.  HENT: Negative for congestion, sinus pressure and ear discharge.   Eyes: Negative for discharge.  Respiratory: Negative for cough.   Cardiovascular: Negative for chest pain.  Gastrointestinal: Negative for abdominal pain and diarrhea.  Genitourinary: Negative for frequency, hematuria and difficulty urinating.       No incontinence.   Musculoskeletal: Positive for back pain (lower back pain).  Skin: Negative for rash.  Neurological: Positive for numbness (left leg numbness). Negative for seizures and headaches.  Hematological: Negative.   Psychiatric/Behavioral: Negative for hallucinations.    Allergies  Iohexol and Vicodin  Home Medications   Current  Outpatient Rx  Name  Route  Sig  Dispense  Refill  . ASPIRIN BUFFERED 325 MG PO TABS   Oral   Take 325 mg by mouth daily.           Marland Kitchen CLOPIDOGREL BISULFATE 75 MG PO TABS   Oral   Take 75 mg by mouth daily.         . CYCLOBENZAPRINE HCL 10 MG PO TABS   Oral   Take 10 mg by mouth 2 (two) times daily as needed. For muscle pain         . DEXLANSOPRAZOLE 60 MG PO CPDR   Oral   Take 60 mg by mouth daily.         . ISOSORBIDE MONONITRATE ER 30 MG PO TB24   Oral   Take 60 mg by mouth daily.          Marland Kitchen LISINOPRIL-HYDROCHLOROTHIAZIDE 20-12.5 MG PO TABS   Oral   Take 1 tablet by mouth daily.           Marland Kitchen METFORMIN HCL 1000 MG PO TABS   Oral   Take 1,000 mg by mouth 2 (two) times  daily with a meal. Restart this medication on Thursday, Oct 21, 2011.         Marland Kitchen METOPROLOL TARTRATE 50 MG PO TABS   Oral   Take 50 mg by mouth 2 (two) times daily.          Marland Kitchen NITROGLYCERIN 0.6 MG SL SUBL   Sublingual   Place 0.6 mg under the tongue every 5 (five) minutes as needed. As needed for chest pain          . OXYCODONE-ACETAMINOPHEN 10-325 MG PO TABS   Oral   Take 1 tablet by mouth every 8 (eight) hours as needed. For pain         . PRAVASTATIN SODIUM 40 MG PO TABS   Oral   Take 40 mg by mouth every morning.          Marland Kitchen VALSARTAN-HYDROCHLOROTHIAZIDE 320-12.5 MG PO TABS   Oral   Take 1 tablet by mouth daily.         . OXYCODONE-ACETAMINOPHEN 5-325 MG PO TABS   Oral   Take 1 tablet by mouth every 6 (six) hours as needed for pain.   15 tablet   0     BP 120/77  Pulse 80  Temp 98.6 F (37 C) (Oral)  Resp 20  SpO2 100%  Physical Exam  Nursing note and vitals reviewed. Constitutional: He is oriented to person, place, and time. He appears well-developed and well-nourished. No distress.  HENT:  Head: Normocephalic and atraumatic.  Eyes: EOM are normal.  Neck: Neck supple. No tracheal deviation present.  Cardiovascular: Normal rate.   Pulmonary/Chest: Effort normal. No respiratory distress.  Musculoskeletal: Normal range of motion.       Bilateral lumbar para spinal tenderness.  Midline tenderness to lumbar spine.  Symmetrical strength of bilateral lower legs.  Ambulatory.    Neurological: He is alert and oriented to person, place, and time.  Skin: Skin is warm and dry.  Psychiatric: He has a normal mood and affect. His behavior is normal.    ED Course  Procedures  DIAGNOSTIC STUDIES: Oxygen Saturation is 100% on room air, normal by my interpretation.    COORDINATION OF CARE: 7:03 PM Will give Sublimaze injection.  Advised pt to follow up with surgeon.  Pt understands and agrees.   7:15PM Ordered:  fentaNYL (SUBLIMAZE) injection 50 mcg;  ondansetron (ZOFRAN-ODT) disintegrating tablet 4 mg.    Labs Reviewed - No data to display No results found.   1. Low back pain       MDM  Patient with back pain. No neurological deficits. Patient is ambulatory. No warning symptoms of back pain including: loss of bowel or bladder control, night sweats, waking from sleep with back pain, unexplained fevers or weight loss, h/o cancer, IVDU, recent trauma. No concern for cauda equina, epidural abscess, or other serious cause of back pain. Conservative measures such as rest, ice/heat and pain medicine indicated with PCP follow-up if no improvement with conservative management.     I personally performed the services described in this documentation, which was scribed in my presence. The recorded information has been reviewed and is accurate.        Dorthula Matas, PA 12/09/12 2221  Dorthula Matas, PA 12/09/12 2221

## 2012-12-18 ENCOUNTER — Emergency Department (HOSPITAL_COMMUNITY): Payer: BC Managed Care – PPO

## 2012-12-18 ENCOUNTER — Encounter (HOSPITAL_COMMUNITY): Payer: Self-pay | Admitting: *Deleted

## 2012-12-18 ENCOUNTER — Emergency Department (HOSPITAL_COMMUNITY)
Admission: EM | Admit: 2012-12-18 | Discharge: 2012-12-18 | Disposition: A | Discharge status: Home / Self Care | Payer: BC Managed Care – PPO | Attending: Emergency Medicine | Admitting: Emergency Medicine

## 2012-12-18 DIAGNOSIS — R509 Fever, unspecified: Secondary | ICD-10-CM | POA: Insufficient documentation

## 2012-12-18 DIAGNOSIS — E7800 Pure hypercholesterolemia, unspecified: Secondary | ICD-10-CM | POA: Insufficient documentation

## 2012-12-18 DIAGNOSIS — I2510 Atherosclerotic heart disease of native coronary artery without angina pectoris: Secondary | ICD-10-CM | POA: Insufficient documentation

## 2012-12-18 DIAGNOSIS — I252 Old myocardial infarction: Secondary | ICD-10-CM | POA: Insufficient documentation

## 2012-12-18 DIAGNOSIS — Z8679 Personal history of other diseases of the circulatory system: Secondary | ICD-10-CM | POA: Insufficient documentation

## 2012-12-18 DIAGNOSIS — J3489 Other specified disorders of nose and nasal sinuses: Secondary | ICD-10-CM | POA: Insufficient documentation

## 2012-12-18 DIAGNOSIS — Z7902 Long term (current) use of antithrombotics/antiplatelets: Secondary | ICD-10-CM | POA: Insufficient documentation

## 2012-12-18 DIAGNOSIS — Z79899 Other long term (current) drug therapy: Secondary | ICD-10-CM | POA: Insufficient documentation

## 2012-12-18 DIAGNOSIS — I209 Angina pectoris, unspecified: Secondary | ICD-10-CM | POA: Insufficient documentation

## 2012-12-18 DIAGNOSIS — K219 Gastro-esophageal reflux disease without esophagitis: Secondary | ICD-10-CM | POA: Insufficient documentation

## 2012-12-18 DIAGNOSIS — Z9861 Coronary angioplasty status: Secondary | ICD-10-CM | POA: Insufficient documentation

## 2012-12-18 DIAGNOSIS — Z8701 Personal history of pneumonia (recurrent): Secondary | ICD-10-CM | POA: Insufficient documentation

## 2012-12-18 DIAGNOSIS — Z8709 Personal history of other diseases of the respiratory system: Secondary | ICD-10-CM | POA: Insufficient documentation

## 2012-12-18 DIAGNOSIS — I10 Essential (primary) hypertension: Secondary | ICD-10-CM | POA: Insufficient documentation

## 2012-12-18 DIAGNOSIS — B349 Viral infection, unspecified: Secondary | ICD-10-CM

## 2012-12-18 DIAGNOSIS — R5381 Other malaise: Secondary | ICD-10-CM | POA: Insufficient documentation

## 2012-12-18 DIAGNOSIS — E119 Type 2 diabetes mellitus without complications: Secondary | ICD-10-CM | POA: Insufficient documentation

## 2012-12-18 DIAGNOSIS — B9789 Other viral agents as the cause of diseases classified elsewhere: Secondary | ICD-10-CM | POA: Insufficient documentation

## 2012-12-18 DIAGNOSIS — Z7982 Long term (current) use of aspirin: Secondary | ICD-10-CM | POA: Insufficient documentation

## 2012-12-18 DIAGNOSIS — F17200 Nicotine dependence, unspecified, uncomplicated: Secondary | ICD-10-CM | POA: Insufficient documentation

## 2012-12-18 DIAGNOSIS — R059 Cough, unspecified: Secondary | ICD-10-CM | POA: Insufficient documentation

## 2012-12-18 DIAGNOSIS — R05 Cough: Secondary | ICD-10-CM | POA: Insufficient documentation

## 2012-12-18 MED ORDER — IBUPROFEN 400 MG PO TABS: 600.0000 mg | ORAL_TABLET | Freq: Once | ORAL | Status: DC

## 2012-12-18 MED ORDER — OSELTAMIVIR PHOSPHATE 75 MG PO CAPS: 75.0000 mg | ORAL_CAPSULE | Freq: Two times a day (BID) | ORAL | Status: DC

## 2012-12-18 NOTE — ED Provider Notes (Addendum)
History     CSN: [id number]  Arrival date & time 12/18/12  1348   First MD Initiated Contact with Patient 12/18/12 1449      Chief Complaint  Patient presents with  . Cough  . Weakness    (Consider location/radiation/quality/duration/timing/severity/associated sxs/prior treatment) Patient is a 47 y.o. male presenting with cough and weakness. The history is provided by the patient.  Cough Pertinent negatives include no chest pain, no headaches, no shortness of breath and no eye redness.  Weakness The primary symptoms include fever. Primary symptoms do not include headaches.  pt c/o cough for past 1-2 days. occ small amt phlegm, no certain color.  Also notes scratchy/sore throat, body aches, nasal congestion and subjective fever. No severe headaches or neck pain or stiffness. No nvd. No dysuria or gu c/o. No rash. No known ill contacts. No hx chronic lung disease or asthma. Occasional smoker.      Past Medical History  Diagnosis Date  . Myocardial infarction 2 yrs ago  . Heart disease   . Hypercholesteremia   . Coronary artery disease   . NSTEMI (non-ST elevated myocardial infarction) 10/18/2011  . CAD (coronary artery disease), native coronary artery 04/18/2008  . Diabetes mellitus     Takes Metformin  . Hypertension     Takes Lisinopril   . GERD (gastroesophageal reflux disease)     Takes Dexilant  . Angina     Takes Isosorbide & Nitroglycerin  . Headache   . Pneumonia     approx 2 years ago  . Bronchitis     hx of  . Shortness of breath     Occurs while laying down at times    Past Surgical History  Procedure Date  . Coronary angioplasty with stent placement 09/01/11    1 stent  . Colonoscopy w/ polypectomy   . Lumbar laminectomy/decompression microdiscectomy 03/31/2012    Procedure: LUMBAR LAMINECTOMY/DECOMPRESSION MICRODISCECTOMY 1 LEVEL;  Surgeon: Temple Pacini, MD;  Location: [facility];  Service: Neurosurgery;  Laterality: Left;    Family History    Problem Relation Age of Onset  . Anesthesia problems Neg Hx   . Hypotension Neg Hx   . Malignant hyperthermia Neg Hx   . Pseudochol deficiency Neg Hx     History  Substance Use Topics  . Smoking status: Current Some Day Smoker  . Smokeless tobacco: Not on file     Comment: Pt st's he smoked 1 cig. tody to see if it would ease the pain  . Alcohol Use: No      Review of Systems  Constitutional: Positive for fever.  HENT: Positive for congestion. Negative for neck pain and sinus pressure.   Eyes: Negative for discharge and redness.  Respiratory: Positive for cough. Negative for shortness of breath.   Cardiovascular: Negative for chest pain.  Gastrointestinal: Negative for abdominal pain.  Genitourinary: Negative for flank pain.  Musculoskeletal: Negative for back pain.  Skin: Negative for rash.  Neurological: Negative for headaches.  Hematological: Does not bruise/bleed easily.  Psychiatric/Behavioral: Negative for confusion.    Allergies  Iohexol and Vicodin  Home Medications   Current Outpatient Rx  Name  Route  Sig  Dispense  Refill  . ASPIRIN BUFFERED 325 MG PO TABS   Oral   Take 325 mg by mouth daily.           Marland Kitchen CLOPIDOGREL BISULFATE 75 MG PO TABS   Oral   Take 75 mg by mouth daily.         Marland Kitchen  CYCLOBENZAPRINE HCL 10 MG PO TABS   Oral   Take 10 mg by mouth 2 (two) times daily as needed. For muscle pain         . DEXLANSOPRAZOLE 60 MG PO CPDR   Oral   Take 60 mg by mouth daily.         . ISOSORBIDE MONONITRATE ER 30 MG PO TB24   Oral   Take 60 mg by mouth daily.          Marland Kitchen LISINOPRIL-HYDROCHLOROTHIAZIDE 20-12.5 MG PO TABS   Oral   Take 1 tablet by mouth daily.           Marland Kitchen METFORMIN HCL 1000 MG PO TABS   Oral   Take 1,000 mg by mouth 2 (two) times daily with a meal. Restart this medication on Thursday, Oct 21, 2011.         Marland Kitchen METOPROLOL TARTRATE 50 MG PO TABS   Oral   Take 50 mg by mouth 2 (two) times daily.          Marland Kitchen  NITROGLYCERIN 0.6 MG SL SUBL   Sublingual   Place 0.6 mg under the tongue every 5 (five) minutes as needed. As needed for chest pain          . OXYCODONE-ACETAMINOPHEN 10-325 MG PO TABS   Oral   Take 1 tablet by mouth every 8 (eight) hours as needed. For pain         . OXYCODONE-ACETAMINOPHEN 5-325 MG PO TABS   Oral   Take 1 tablet by mouth every 6 (six) hours as needed for pain.   15 tablet   0   . PRAVASTATIN SODIUM 40 MG PO TABS   Oral   Take 40 mg by mouth every morning.          Marland Kitchen VALSARTAN-HYDROCHLOROTHIAZIDE 320-12.5 MG PO TABS   Oral   Take 1 tablet by mouth daily.           BP 156/87  Pulse 99  Temp 98 F (36.7 C) (Oral)  Resp 22  SpO2 95%  Physical Exam  Nursing note and vitals reviewed. Constitutional: He is oriented to person, place, and time. He appears well-developed and well-nourished. No distress.  HENT:  Head: Atraumatic.  Mouth/Throat: Oropharynx is clear and moist.       Mild nasal congestion.  Pharynx mildly erythematous, no asymmetric swelling/abscess or exudate. No trismus.   Eyes: Pupils are equal, round, and reactive to light.  Neck: Neck supple. No tracheal deviation present.  Cardiovascular: Normal rate, regular rhythm, normal heart sounds and intact distal pulses.   Pulmonary/Chest: Effort normal. No accessory muscle usage. No respiratory distress.       Non productive cough.   Abdominal: Soft. Bowel sounds are normal. He exhibits no distension. There is no tenderness.  Genitourinary:       No cva tenderness  Musculoskeletal: Normal range of motion. He exhibits no edema and no tenderness.  Neurological: He is alert and oriented to person, place, and time.  Skin: Skin is warm and dry. No rash noted.  Psychiatric: He has a normal mood and affect.    ED Course  Procedures (including critical care time)   Dg Chest 2 View  12/18/2012  *RADIOLOGY REPORT*  Clinical Data: Cough, weakness  CHEST - 2 VIEW  Comparison: 03/31/2012   Findings: Mild bronchitic changes.  No focal consolidation.  No pleural effusion or pneumothorax.  The heart is top normal in size.  Coronary stent.  Mild degenerative changes of the visualized thoracolumbar spine.  IMPRESSION: Mild bronchitic changes.  No evidence of acute cardiopulmonary disease.   Original Report Authenticated By: Charline Bills, M.D.       MDM  cxr from triage.  Reviewed nursing notes and prior charts for additional history.   Pt symptoms felt most c/w flu-like illness. Given recent symptom onset, will give rx for tamiflu.  Pt breathing comfortably. No rales. No wheezing. No stridor.  Drinking sprite without difficulty.       Suzi Roots, MD 12/18/12 1537  Suzi Roots, MD 12/18/12 [phone]

## 2012-12-18 NOTE — ED Notes (Signed)
To ED for eval of productive that started 2 days ago and getting worse. Unknown fever, but has had chills. Generalized weakness. Cp with cough.

## 2013-02-02 ENCOUNTER — Other Ambulatory Visit: Payer: Self-pay | Admitting: Neurosurgery

## 2013-02-02 DIAGNOSIS — M792 Neuralgia and neuritis, unspecified: Secondary | ICD-10-CM

## 2013-02-08 ENCOUNTER — Ambulatory Visit
Admission: RE | Admit: 2013-02-08 | Discharge: 2013-02-08 | Disposition: A | Discharge status: Home / Self Care | Payer: BC Managed Care – PPO | Source: Ambulatory Visit | Attending: Neurosurgery | Admitting: Neurosurgery

## 2013-02-08 DIAGNOSIS — M792 Neuralgia and neuritis, unspecified: Secondary | ICD-10-CM

## 2013-02-15 ENCOUNTER — Ambulatory Visit
Admission: RE | Admit: 2013-02-15 | Discharge: 2013-02-15 | Disposition: A | Discharge status: Home / Self Care | Payer: BC Managed Care – PPO | Source: Ambulatory Visit | Attending: Neurosurgery | Admitting: Neurosurgery

## 2013-02-15 MED ORDER — GADOBENATE DIMEGLUMINE 529 MG/ML IV SOLN
20.0000 mL | Freq: Once | INTRAVENOUS | Status: AC | PRN
  Administered 2013-02-15: 20 mL via INTRAVENOUS

## 2013-03-20 ENCOUNTER — Encounter (HOSPITAL_COMMUNITY): Payer: Self-pay | Admitting: Emergency Medicine

## 2013-03-20 ENCOUNTER — Emergency Department (INDEPENDENT_AMBULATORY_CARE_PROVIDER_SITE_OTHER)
Admission: EM | Admit: 2013-03-20 | Discharge: 2013-03-20 | Disposition: A | Discharge status: Home / Self Care | Payer: BC Managed Care – PPO | Source: Home / Self Care | Attending: Emergency Medicine | Admitting: Emergency Medicine

## 2013-03-20 DIAGNOSIS — K0889 Other specified disorders of teeth and supporting structures: Secondary | ICD-10-CM

## 2013-03-20 DIAGNOSIS — K089 Disorder of teeth and supporting structures, unspecified: Secondary | ICD-10-CM

## 2013-03-20 MED ORDER — OXYCODONE-ACETAMINOPHEN 5-325 MG PO TABS: 2.0000 | ORAL_TABLET | ORAL | Status: DC | PRN

## 2013-03-20 MED ORDER — CLINDAMYCIN HCL 300 MG PO CAPS: 300.0000 mg | ORAL_CAPSULE | Freq: Four times a day (QID) | ORAL | Status: DC

## 2013-03-20 NOTE — ED Provider Notes (Signed)
History     CSN: [id number]  Arrival date & time 03/20/13  1858   First MD Initiated Contact with Patient 03/20/13 1958      No chief complaint on file.   (Consider location/radiation/quality/duration/timing/severity/associated sxs/prior treatment) Patient is a 47 y.o. male presenting with tooth pain. The history is provided by the patient. No language interpreter was used.  Dental PainThe primary symptoms include mouth pain and dental injury. The symptoms began more than 1 month ago. The symptoms are worsening. The symptoms occur constantly.  Additional symptoms include: dental sensitivity to temperature, gum swelling and facial swelling. Medical issues do not include: smoking.    Past Medical History  Diagnosis Date  . Myocardial infarction 2 yrs ago  . Heart disease   . Hypercholesteremia   . Coronary artery disease   . NSTEMI (non-ST elevated myocardial infarction) 10/18/2011  . CAD (coronary artery disease), native coronary artery 04/18/2008  . Diabetes mellitus     Takes Metformin  . Hypertension     Takes Lisinopril   . GERD (gastroesophageal reflux disease)     Takes Dexilant  . Angina     Takes Isosorbide & Nitroglycerin  . Headache   . Pneumonia     approx 2 years ago  . Bronchitis     hx of  . Shortness of breath     Occurs while laying down at times    Past Surgical History  Procedure Laterality Date  . Coronary angioplasty with stent placement  09/01/11    1 stent  . Colonoscopy w/ polypectomy    . Lumbar laminectomy/decompression microdiscectomy  03/31/2012    Procedure: LUMBAR LAMINECTOMY/DECOMPRESSION MICRODISCECTOMY 1 LEVEL;  Surgeon: Temple Pacini, MD;  Location: [facility];  Service: Neurosurgery;  Laterality: Left;    Family History  Problem Relation Age of Onset  . Anesthesia problems Neg Hx   . Hypotension Neg Hx   . Malignant hyperthermia Neg Hx   . Pseudochol deficiency Neg Hx     History  Substance Use Topics  . Smoking status:  Current Some Day Smoker  . Smokeless tobacco: Not on file     Comment: Pt st's he smoked 1 cig. tody to see if it would ease the pain  . Alcohol Use: No      Review of Systems  HENT: Positive for facial swelling.     Allergies  Iohexol and Vicodin  Home Medications   Current Outpatient Rx  Name  Route  Sig  Dispense  Refill  . aspirin 325 MG buffered tablet   Oral   Take 325 mg by mouth daily.           . clopidogrel (PLAVIX) 75 MG tablet   Oral   Take 75 mg by mouth daily.         . cyclobenzaprine (FLEXERIL) 10 MG tablet   Oral   Take 10 mg by mouth 2 (two) times daily as needed. For muscle pain         . dexlansoprazole (DEXILANT) 60 MG capsule   Oral   Take 60 mg by mouth daily.         . isosorbide mononitrate (IMDUR) 30 MG 24 hr tablet   Oral   Take 60 mg by mouth daily.          Marland Kitchen lisinopril-hydrochlorothiazide (PRINZIDE,ZESTORETIC) 20-12.5 MG per tablet   Oral   Take 1 tablet by mouth daily.           Marland Kitchen  metFORMIN (GLUCOPHAGE) 1000 MG tablet   Oral   Take 1,000 mg by mouth 2 (two) times daily with a meal. Restart this medication on Thursday, Oct 21, 2011.         . metoprolol (LOPRESSOR) 50 MG tablet   Oral   Take 50 mg by mouth 2 (two) times daily.          . nitroGLYCERIN (NITROSTAT) 0.6 MG SL tablet   Sublingual   Place 0.6 mg under the tongue every 5 (five) minutes as needed. As needed for chest pain          . oseltamivir (TAMIFLU) 75 MG capsule   Oral   Take 1 capsule (75 mg total) by mouth every 12 (twelve) hours.   10 capsule   0   . oxyCODONE-acetaminophen (PERCOCET/ROXICET) 5-325 MG per tablet   Oral   Take 1 tablet by mouth every 6 (six) hours as needed for pain.   15 tablet   0   . pravastatin (PRAVACHOL) 40 MG tablet   Oral   Take 40 mg by mouth every morning.          . valsartan-hydrochlorothiazide (DIOVAN-HCT) 320-12.5 MG per tablet   Oral   Take 1 tablet by mouth daily.           There were no  vitals taken for this visit.  Physical Exam  Nursing note and vitals reviewed. Constitutional: He is oriented to person, place, and time. He appears well-developed and well-nourished.  HENT:  Head: Normocephalic.  Right Ear: External ear normal.  Left Ear: External ear normal.  Nose: Nose normal.  Mouth/Throat: Oropharynx is clear and moist.  Eyes: Conjunctivae and EOM are normal. Pupils are equal, round, and reactive to light.  Neck: Normal range of motion. Neck supple.  Cardiovascular: Normal rate.   Pulmonary/Chest: Effort normal and breath sounds normal.  Musculoskeletal: Normal range of motion.  Neurological: He is alert and oriented to person, place, and time. He has normal reflexes.  Psychiatric: He has a normal mood and affect.    ED Course  Procedures (including critical care time)  Labs Reviewed - No data to display No results found.   1. Toothache       MDM  Percocet  And clindamycin       Elson Areas, PA-C 03/20/13 2000  Lonia Skinner [place], [place] 03/20/13 2002

## 2013-03-20 NOTE — ED Notes (Signed)
Pt c/o dental pain "all teeth hurt" symptoms present since 4/27 No relief with otc meds.  Denies any other symptoms,.

## 2013-03-20 NOTE — ED Provider Notes (Signed)
Medical screening examination/treatment/procedure(s) were performed by non-physician practitioner and as supervising physician I was immediately available for consultation/collaboration.  Raynald Blend, MD 03/20/13 2021

## 2013-04-09 ENCOUNTER — Other Ambulatory Visit: Payer: Self-pay | Admitting: Pharmacist

## 2013-04-09 MED ORDER — CLOPIDOGREL BISULFATE 75 MG PO TABS: 75.0000 mg | ORAL_TABLET | Freq: Every day | ORAL | Status: DC

## 2013-04-11 ENCOUNTER — Other Ambulatory Visit: Payer: Self-pay | Admitting: Internal Medicine

## 2013-04-12 ENCOUNTER — Other Ambulatory Visit: Payer: Self-pay | Admitting: *Deleted

## 2013-04-12 NOTE — Telephone Encounter (Signed)
error 

## 2013-06-04 ENCOUNTER — Other Ambulatory Visit: Payer: Self-pay | Admitting: Internal Medicine

## 2013-06-05 ENCOUNTER — Other Ambulatory Visit: Payer: Self-pay | Admitting: Internal Medicine

## 2013-06-05 NOTE — Telephone Encounter (Signed)
Rx was sent to pharmacy electronically. 

## 2013-06-14 ENCOUNTER — Encounter: Payer: Self-pay | Admitting: Cardiovascular Disease

## 2013-06-14 ENCOUNTER — Telehealth: Payer: Self-pay | Admitting: Internal Medicine

## 2013-06-14 NOTE — Telephone Encounter (Signed)
Will need an appointment with me for cardiac clearance.   Dr. Rennis Golden

## 2013-06-14 NOTE — Telephone Encounter (Signed)
Error encounter  This encounter was created in error - please disregard.

## 2013-06-14 NOTE — Telephone Encounter (Signed)
Returned call and spoke w/ pt's mother, Nicole Cella.  Stated pt doesn't live there and doesn't have a phone.  Asked her to give pt the message that he needs to schedule an appt w/ Dr. Rennis Golden for clearance to have teeth pulled.  Agreed to inform pt.

## 2013-06-14 NOTE — Telephone Encounter (Signed)
Message forwarded to Dr. Rennis Golden.  Paper chart# [id number] on cart.

## 2013-06-14 NOTE — Telephone Encounter (Deleted)
[name] states that he is having some medication issues and wants to speak w JC

## 2013-06-14 NOTE — Telephone Encounter (Signed)
Mr. Daft states that he is having his teet pulled and needs clearance.

## 2013-06-14 NOTE — Telephone Encounter (Signed)
Please call pt's cell phone  [phone]

## 2013-06-18 ENCOUNTER — Telehealth: Payer: Self-pay | Admitting: Internal Medicine

## 2013-06-21 NOTE — Telephone Encounter (Signed)
Called pt a second time to schedule appointment. No answer. Mailed letter.

## 2013-06-22 ENCOUNTER — Other Ambulatory Visit: Payer: Self-pay | Admitting: Internal Medicine

## 2013-06-22 NOTE — Telephone Encounter (Signed)
Patient has not been seen since 03/06/2012. Has had multiple warnings. Rx request denied, sent to pharmacy electronically.

## 2013-06-24 ENCOUNTER — Other Ambulatory Visit: Payer: Self-pay | Admitting: Internal Medicine

## 2013-06-25 NOTE — Telephone Encounter (Signed)
Rx was sent to pharmacy electronically. 

## 2013-06-29 NOTE — Telephone Encounter (Signed)
Please call-concerning some of his medicine he is on.

## 2013-06-29 NOTE — Telephone Encounter (Signed)
Returned call.  Pt with concerns about simvastatin and another med.  Stated he needs refills and they were denied bc he needs an appt.  Pt informed he is correct and will need to be seen in order to get more refills.  Pt also informed information received that he needs clearance for a dental procedure and would need to be seen for that as well. Pt verbalized understanding and agreed w/ plan.  Appt scheduled for 8.19.14 at 8:30am w/ Dr. Rennis Golden.

## 2013-07-05 ENCOUNTER — Encounter: Payer: Self-pay | Admitting: *Deleted

## 2013-07-06 ENCOUNTER — Encounter: Payer: Self-pay | Admitting: Internal Medicine

## 2013-07-10 ENCOUNTER — Telehealth: Payer: Self-pay | Admitting: *Deleted

## 2013-07-10 ENCOUNTER — Ambulatory Visit (INDEPENDENT_AMBULATORY_CARE_PROVIDER_SITE_OTHER): Payer: BC Managed Care – PPO | Admitting: Internal Medicine

## 2013-07-10 ENCOUNTER — Encounter: Payer: Self-pay | Admitting: Internal Medicine

## 2013-07-10 VITALS — BP 132/76 | HR 76 | Ht 68.0 in | Wt 279.1 lb

## 2013-07-10 DIAGNOSIS — E669 Obesity, unspecified: Secondary | ICD-10-CM

## 2013-07-10 DIAGNOSIS — E119 Type 2 diabetes mellitus without complications: Secondary | ICD-10-CM

## 2013-07-10 DIAGNOSIS — I252 Old myocardial infarction: Secondary | ICD-10-CM

## 2013-07-10 DIAGNOSIS — R079 Chest pain, unspecified: Secondary | ICD-10-CM

## 2013-07-10 DIAGNOSIS — I2510 Atherosclerotic heart disease of native coronary artery without angina pectoris: Secondary | ICD-10-CM

## 2013-07-10 NOTE — Telephone Encounter (Signed)
Faxed dental surgical clearance on 07/10/13

## 2013-07-10 NOTE — Patient Instructions (Addendum)
The patient is given a work excuse note for 1 day. Patient was seen in office on 07/10/13.  - Dr. K. Italy Hilty  Your physician wants you to follow-up in: 1 year. You will receive a reminder letter in the mail two months in advance. If you don't receive a letter, please call our office to schedule the follow-up appointment.

## 2013-07-10 NOTE — Progress Notes (Signed)
OFFICE NOTE  Chief Complaint:  Routine followup, preoperative evaluation  Primary Care Physician: Evlyn Courier, MD  HPI:  Andrew West  is a 47 year old gentleman with a history of coronary artery disease and stent placement to the circumflex and obtuse marginal and a history of in stent restenosis to the LAD. There is also a nondominant right coronary artery that is 100% occluded, filled with collaterals. He did have a stress test recently, which showed an EF of 53% and reversible septal ischemia in the setting of chest pain and after much convincing underwent cardiac catheterization.  He then underwent cutting balloon angioplasty to an ostial lateral OM1 branch and the bifurcation AV groove circumflex OM junction. This was successful at reducing the stenosis to 0%. This was in November 2012 and he did not return for followup appointment until April 2013. He was suffering from low back pain and has been evaluated and treated by Dr. Dutch Quint with a laminectomy/discectomy, which he says was not helpful. Otherwise, he continues to smoke and occasionally complains of shortness of breath and some chest pain symptoms which are atypical. He is on long-acting and/or has not needed to take short-acting nitroglycerin. His other concern today is that he is having problems with his teeth and was recommended to have edentulation and by an oral surgeon Dr. Ocie Doyne. Finally, he is suffering from significant stress and depression due to recent separation with wife in dealing with his kids. This seems to be a big tablets on his continued smoking.  PMHx:  Past Medical History  Diagnosis Date  . Myocardial infarction     2002; treated with stent to mid L circumflex  . Heart disease   . Hypercholesteremia   . Coronary artery disease   . NSTEMI (non-ST elevated myocardial infarction) 10/18/2011  . CAD (coronary artery disease), native coronary artery 04/18/2008  . Diabetes mellitus     Takes Metformin  .  Hypertension     Takes Lisinopril   . GERD (gastroesophageal reflux disease)     Takes Dexilant  . Angina     Takes Isosorbide & Nitroglycerin  . Headache(784.0)   . Pneumonia     approx 2 years ago  . Bronchitis     hx of  . Shortness of breath     Occurs while laying down at times  . History of nuclear stress test 07/29/2011    decreased activity in septum on stress images compared with rest; wall motion abnormality, EF 53%  . Rhabdomyolysis     h/o, r/t statins  . H/O cardiac catheterization     with 5 separate dated interventions    Past Surgical History  Procedure Laterality Date  . Coronary angioplasty with stent placement  10/09/2001    PTCA & stenting of mid AV circumflex; 2.5x29mm Pixel stent  . Colonoscopy w/ polypectomy    . Lumbar laminectomy/decompression microdiscectomy  03/31/2012    Procedure: LUMBAR LAMINECTOMY/DECOMPRESSION MICRODISCECTOMY 1 LEVEL;  Surgeon: Temple Pacini, MD;  Location: [facility];  Service: Neurosurgery;  Laterality: Left;  . Transthoracic echocardiogram  07/28/2011    EF 55-65%; LVH, grade 1 diastolic dysfunction;   . Coronary angioplasty with stent placement  12/13/2001    PCI with stent to mid L circumflex, 95% stenosis to 0% residual  . Coronary angioplasty with stent placement  10/10/2003    PCI to mid AV circumflex; LAD 30% disease; RCA 100% occluded prox.  . Coronary angioplasty with stent placement  09/01/2011  PCI with stenting with bare metal stent to mid AV groove circumflex and PDA  . Coronary angioplasty with stent placement  10/17/2011    cutting balloon angioplasty of ostial lateral OM1 branch and bifurcation AV groove circumflex OM junction; stenosis reduced to 0%    FAMHx:  Family History  Problem Relation Age of Onset  . Anesthesia problems Neg Hx   . Hypotension Neg Hx   . Malignant hyperthermia Neg Hx   . Pseudochol deficiency Neg Hx   . Hypertension Mother   . Heart attack Father   . Diabetes Mother   . Heart  disease Brother     x 3   . Heart attack Brother     deceased  . Hypertension Sister   . Diabetes Sister     SOCHx:   reports that he has been smoking Cigarettes.  He has been smoking about 0.00 packs per day for the past 25 years. He has never used smokeless tobacco. He reports that he does not drink alcohol or use illicit drugs.  ALLERGIES:  Allergies  Allergen Reactions  . Iohexol      Desc: PT BECAME SOB AND CHEST TIGHTNESS AFTER CONTRAST INJECTION.  [name],RT-RCT., Onset Date: [id number]   . Vicodin [Hydrocodone-Acetaminophen] Itching and Rash    ROS: A comprehensive review of systems was negative except for: Constitutional: positive for fatigue Ears, nose, mouth, throat, and face: positive for hoarseness Respiratory: positive for dyspnea on exertion Cardiovascular: positive for chest pain Musculoskeletal: positive for arthralgias, back pain and neuropahty  HOME MEDS: Current Outpatient Prescriptions  Medication Sig Dispense Refill  . amoxicillin (AMOXIL) 500 MG capsule Take 1 capsule by mouth daily.      Marland Kitchen aspirin 81 MG tablet Take 81 mg by mouth daily.      . chlorhexidine (PERIDEX) 0.12 % solution daily.      . clomiPHENE (CLOMID) 50 MG tablet Take 25 mg by mouth daily.      . clopidogrel (PLAVIX) 75 MG tablet Take 1 tablet (75 mg total) by mouth daily.  30 tablet  6  . dexlansoprazole (DEXILANT) 60 MG capsule Take 60 mg by mouth daily.      Marland Kitchen gabapentin (NEURONTIN) 100 MG capsule Take 100 mg by mouth 2 (two) times daily.      . isosorbide mononitrate (IMDUR) 30 MG 24 hr tablet TAKE TWO TABLETS BY MOUTH ONCE DAILY AS  DIRECTED. PATIENT NEEDS TO CALL OFFICE TO SCHEDULE AN OPPOINTMENT.  15 tablet  0  . lisinopril-hydrochlorothiazide (PRINZIDE,ZESTORETIC) 20-12.5 MG per tablet Take 0.5 tablets by mouth daily.       . metFORMIN (GLUCOPHAGE) 1000 MG tablet Take 1,000 mg by mouth 2 (two) times daily with a meal. Restart this medication on Thursday, Oct 21, 2011.        . metoprolol (LOPRESSOR) 50 MG tablet Take 50 mg by mouth 2 (two) times daily.       . nitroGLYCERIN (NITROSTAT) 0.4 MG SL tablet Place 0.4 mg under the tongue every 5 (five) minutes as needed for chest pain.      Marland Kitchen oxyCODONE-acetaminophen (PERCOCET/ROXICET) 5-325 MG per tablet Take 2 tablets by mouth every 4 (four) hours as needed for pain.  16 tablet  0  . pravastatin (PRAVACHOL) 40 MG tablet Take 40 mg by mouth every morning.       . traMADol (ULTRAM) 50 MG tablet Take 50 mg by mouth as needed.      . valsartan-hydrochlorothiazide (DIOVAN-HCT) 320-12.5 MG per tablet  Take 1 tablet by mouth daily.       No current facility-administered medications for this visit.    LABS/IMAGING: No results found for this or any previous visit (from the past 48 hour(s)). No results found.  VITALS: BP 132/76  Pulse 76  Ht 5\' 8"  (1.727 m)  Wt 279 lb 1.6 oz (126.599 kg)  BMI 42.45 kg/m2  EXAM: General appearance: alert and no distress Neck: no adenopathy, no carotid bruit, no JVD, supple, symmetrical, trachea midline and thyroid not enlarged, symmetric, no tenderness/mass/nodules Lungs: clear to auscultation bilaterally Heart: regular rate and rhythm, S1, S2 normal, no murmur, click, rub or gallop Abdomen: soft, non-tender; bowel sounds normal; no masses,  no organomegaly Extremities: extremities normal, atraumatic, no cyanosis or edema Pulses: 2+ and symmetric Skin: Skin color, texture, turgor normal. No rashes or lesions Neurologic: Grossly normal  EKG: Normal sinus rhythm at 76  ASSESSMENT: 1. Low risk for upcoming dental extraction 2. Coronary artery disease status post PCI and cutting balloon angioplasty over 2 years ago 3. Ongoing tobacco abuse 4. Hypertension 5. Dyslipidemia 6. Neuropathy 7. Diabetes type 2 8. Morbid obesity  PLAN: 1.   Mr. Blank has multiple cardiac risk factors and still has continued risks for coronary artery disease. His blood pressure is well controlled on  current medications and unfortunately continues to smoke. He is also not managed to lose any weight and is morbidly obese with a BMI of 42.  He is under significant stress now with recent separation from his wife and I don't think he can stop smoking anytime soon. His main issue now is problems with his teeth which are recommended to be pulled. I think he is low risk for this and to stop his aspirin 7 days prior to the procedure and Plavix 5 days prior. I communicated this to Dr. Barbette Merino via fax and will CC him on his message.  Chrystie Nose, MD, [facility] Attending Cardiologist [facility]  HILTY,Kenneth C 07/10/2013, 9:27 AM

## 2013-07-19 ENCOUNTER — Other Ambulatory Visit: Payer: Self-pay | Admitting: Internal Medicine

## 2013-07-19 NOTE — Telephone Encounter (Signed)
Rx was sent to pharmacy electronically. 

## 2013-08-14 ENCOUNTER — Encounter (HOSPITAL_COMMUNITY): Payer: Self-pay | Admitting: *Deleted

## 2013-08-14 ENCOUNTER — Emergency Department (INDEPENDENT_AMBULATORY_CARE_PROVIDER_SITE_OTHER)
Admission: EM | Admit: 2013-08-14 | Discharge: 2013-08-14 | Disposition: A | Discharge status: Home / Self Care | Payer: BC Managed Care – PPO | Source: Home / Self Care | Attending: Emergency Medicine | Admitting: Emergency Medicine

## 2013-08-14 DIAGNOSIS — M5126 Other intervertebral disc displacement, lumbar region: Secondary | ICD-10-CM

## 2013-08-14 MED ORDER — MELOXICAM 15 MG PO TABS: 15.0000 mg | ORAL_TABLET | Freq: Every day | ORAL | Status: DC

## 2013-08-14 MED ORDER — HYDROMORPHONE HCL PF 1 MG/ML IJ SOLN
2.0000 mg | Freq: Once | INTRAMUSCULAR | Status: AC
  Administered 2013-08-14: 2 mg via INTRAMUSCULAR

## 2013-08-14 MED ORDER — PREDNISONE 20 MG PO TABS: 20.0000 mg | ORAL_TABLET | Freq: Two times a day (BID) | ORAL | Status: DC

## 2013-08-14 MED ORDER — HYDROMORPHONE HCL PF 1 MG/ML IJ SOLN
INTRAMUSCULAR | Status: AC
  Filled 2013-08-14: qty 2

## 2013-08-14 MED ORDER — KETOROLAC TROMETHAMINE 60 MG/2ML IM SOLN
60.0000 mg | Freq: Once | INTRAMUSCULAR | Status: AC
  Administered 2013-08-14: 60 mg via INTRAMUSCULAR

## 2013-08-14 MED ORDER — ONDANSETRON 4 MG PO TBDP
8.0000 mg | ORAL_TABLET | Freq: Once | ORAL | Status: AC
  Administered 2013-08-14: 8 mg via ORAL

## 2013-08-14 MED ORDER — KETOROLAC TROMETHAMINE 60 MG/2ML IM SOLN
INTRAMUSCULAR | Status: AC
  Filled 2013-08-14: qty 2

## 2013-08-14 MED ORDER — CYCLOBENZAPRINE HCL 5 MG PO TABS: 5.0000 mg | ORAL_TABLET | Freq: Three times a day (TID) | ORAL | Status: DC | PRN

## 2013-08-14 MED ORDER — ONDANSETRON 4 MG PO TBDP
ORAL_TABLET | ORAL | Status: AC
  Filled 2013-08-14: qty 2

## 2013-08-14 MED ORDER — OXYCODONE-ACETAMINOPHEN 5-325 MG PO TABS: ORAL_TABLET | ORAL | Status: DC

## 2013-08-14 NOTE — ED Notes (Signed)
Pt  Reports   Low  Back pain  Radiating  Down   l  Leg      X  3  Days   denys   Any  Recent  Injury but  Has  A  History  Of  Disc  Problems  In past  With  Back  Surgery

## 2013-08-14 NOTE — ED Provider Notes (Signed)
Chief Complaint:   Chief Complaint  Patient presents with  . Back Pain    History of Present Illness:   Andrew West is a 47 year old male who works in Production designer, theatre/television/film at [employer]. 18 months ago he had lumbar disc surgery done by Dr. Lelon Perla. He had done well up until this past Saturday, 4 days ago when he began to have pain in his lower back with radiation down his left leg as far as his foot with numbness and weakness in the leg. He denies any injury. This just happened spontaneously. He denies any urinary retention, urinary incontinence, saddle anesthesia, or incontinence of stool. He's had no fever, chills, unintentional weight loss, or abdominal pain.  Review of Systems:  Other than noted above, the patient denies any of the following symptoms: Systemic:  No fever, chills, severe fatigue, or unexplained weight loss. GI:  No abdominal pain, nausea, vomiting, diarrhea, constipation, incontinence of bowel, or blood in stool. GU:  No dysuria, frequency, urgency, or hematuria. No incontinence of urine or difficulty urinating.  M-S:  No neck pain, joint pain, arthritis, or myalgias. Neuro:  No paresthesias, saddle anesthesia, muscular weakness, or progressive neurological deficit.  PMFSH:  Past medical history, family history, social history, meds, and allergies were reviewed. Specifically, there is no history of cancer, major trauma, osteoporosis, immunosuppression, or HIV infection. He has high blood pressure, diabetes, and coronary artery disease with stents. He takes a list of medications including aspirin, Clomid, Plavix, excellent, Neurontin, Imdur, lisinopril/HCTZ, metformin, metoprolol, nitroglycerin, Zocor, and Diovan/HCTZ.  Physical Exam:   Vital signs:  BP 168/100  Pulse 84  Temp(Src) 98.6 F (37 C) (Oral)  Resp 16  SpO2 100% General:  Alert, oriented, in severe distress due to lower back pain. Abdomen:  Soft, non-tender.  No organomegaly or mass.  No  pulsatile midline abdominal mass or bruit. Back:  Lower back is tender to palpation and has almost 0 range of motion. Straight leg raising was positive on the left with a positive Lasegue's sign and positive popliteal compression. Neuro:  Normal muscle strength and sensation. Knee reflexes were 1+ bilaterally. Ankle reflex was +2 on the right and 0 on the left. Extremities: Pedal pulses were full, there was no edema. Skin:  Clear, warm and dry.  No rash.  Course in Urgent Care Center:   Given Toradol 60 mg IM, Dilaudid 2 mg IM, and Zofran 8 mg sublingually.  Assessment:  The encounter diagnosis was HNP (herniated nucleus pulposus), lumbar.  No evidence of cauda equina syndrome. He will need to be seen by Dr. Dutch Quint as soon as possible. I called his office and they will see him on Thursday.  Plan:   1.  Meds:  The following meds were prescribed:   Discharge Medication List as of 08/14/2013  3:16 PM    START taking these medications   Details  cyclobenzaprine (FLEXERIL) 5 MG tablet Take 1 tablet (5 mg total) by mouth 3 (three) times daily as needed for muscle spasms., Starting 08/14/2013, Until Discontinued, Normal    meloxicam (MOBIC) 15 MG tablet Take 1 tablet (15 mg total) by mouth daily., Starting 08/14/2013, Until Discontinued, Normal    !! oxyCODONE-acetaminophen (PERCOCET) 5-325 MG per tablet 1 to 2 tablets every 6 hours as needed for pain., Print    predniSONE (DELTASONE) 20 MG tablet Take 1 tablet (20 mg total) by mouth 2 (two) times daily., Starting 08/14/2013, Until Discontinued, Normal     !! - Potential  duplicate medications found. Please discuss with provider.      2.  Patient Education/Counseling:  The patient was given appropriate handouts, self care instructions, and instructed in symptomatic relief. The patient was encouraged to try to be as active as possible and given some exercises to do followed by moist heat. Rest and heat for right now.  3.  Follow up:  The patient  was told to follow up if no better in 3 to 4 days, if becoming worse in any way, and given some red flag symptoms such as new neurological symptoms which would prompt immediate return.  Follow up with Dr. Lelon Perla on Thursday which is 2 days from now.     Reuben Likes, MD 08/14/13 2155

## 2013-08-28 ENCOUNTER — Emergency Department (HOSPITAL_COMMUNITY)
Admission: EM | Admit: 2013-08-28 | Discharge: 2013-08-28 | Disposition: A | Discharge status: Home / Self Care | Payer: BC Managed Care – PPO | Attending: Emergency Medicine | Admitting: Emergency Medicine

## 2013-08-28 ENCOUNTER — Emergency Department (HOSPITAL_COMMUNITY): Payer: BC Managed Care – PPO

## 2013-08-28 ENCOUNTER — Encounter (HOSPITAL_COMMUNITY): Payer: Self-pay | Admitting: Nurse Practitioner

## 2013-08-28 DIAGNOSIS — Z79899 Other long term (current) drug therapy: Secondary | ICD-10-CM | POA: Insufficient documentation

## 2013-08-28 DIAGNOSIS — I10 Essential (primary) hypertension: Secondary | ICD-10-CM | POA: Insufficient documentation

## 2013-08-28 DIAGNOSIS — Z8701 Personal history of pneumonia (recurrent): Secondary | ICD-10-CM | POA: Insufficient documentation

## 2013-08-28 DIAGNOSIS — K219 Gastro-esophageal reflux disease without esophagitis: Secondary | ICD-10-CM | POA: Insufficient documentation

## 2013-08-28 DIAGNOSIS — R509 Fever, unspecified: Secondary | ICD-10-CM | POA: Insufficient documentation

## 2013-08-28 DIAGNOSIS — R079 Chest pain, unspecified: Secondary | ICD-10-CM | POA: Insufficient documentation

## 2013-08-28 DIAGNOSIS — E119 Type 2 diabetes mellitus without complications: Secondary | ICD-10-CM | POA: Insufficient documentation

## 2013-08-28 DIAGNOSIS — Z8739 Personal history of other diseases of the musculoskeletal system and connective tissue: Secondary | ICD-10-CM | POA: Insufficient documentation

## 2013-08-28 DIAGNOSIS — I252 Old myocardial infarction: Secondary | ICD-10-CM | POA: Insufficient documentation

## 2013-08-28 DIAGNOSIS — J40 Bronchitis, not specified as acute or chronic: Secondary | ICD-10-CM | POA: Insufficient documentation

## 2013-08-28 DIAGNOSIS — F17200 Nicotine dependence, unspecified, uncomplicated: Secondary | ICD-10-CM | POA: Insufficient documentation

## 2013-08-28 DIAGNOSIS — E7800 Pure hypercholesterolemia, unspecified: Secondary | ICD-10-CM | POA: Insufficient documentation

## 2013-08-28 DIAGNOSIS — Z7982 Long term (current) use of aspirin: Secondary | ICD-10-CM | POA: Insufficient documentation

## 2013-08-28 DIAGNOSIS — I2510 Atherosclerotic heart disease of native coronary artery without angina pectoris: Secondary | ICD-10-CM | POA: Insufficient documentation

## 2013-08-28 DIAGNOSIS — Z9861 Coronary angioplasty status: Secondary | ICD-10-CM | POA: Insufficient documentation

## 2013-08-28 LAB — CBC
HCT: 44.6 % (ref 39.0–52.0)
Hemoglobin: 15.7 g/dL (ref 13.0–17.0)
MCH: 27.6 pg (ref 26.0–34.0)
MCHC: 35.2 g/dL (ref 30.0–36.0)
Platelets: 230 10*3/uL (ref 150–400)
RBC: 5.69 MIL/uL (ref 4.22–5.81)
WBC: 7.5 10*3/uL (ref 4.0–10.5)

## 2013-08-28 LAB — BASIC METABOLIC PANEL
CO2: 26 mEq/L (ref 19–32)
Calcium: 8.8 mg/dL (ref 8.4–10.5)
GFR calc non Af Amer: >90 mL/min (ref >90)
Potassium: 3.7 mEq/L (ref 3.5–5.1)
Sodium: 144 mEq/L (ref 135–145)

## 2013-08-28 LAB — POCT I-STAT TROPONIN I: Troponin i, poc: 0 ng/mL (ref 0.00–0.08)

## 2013-08-28 LAB — PRO B NATRIURETIC PEPTIDE: Pro B Natriuretic peptide (BNP): 18.9 pg/mL (ref 0–125)

## 2013-08-28 MED ORDER — PREDNISONE 10 MG PO TABS: 60.0000 mg | ORAL_TABLET | Freq: Every day | ORAL | Status: DC

## 2013-08-28 MED ORDER — BENZONATATE 100 MG PO CAPS: 100.0000 mg | ORAL_CAPSULE | Freq: Three times a day (TID) | ORAL | Status: AC | PRN

## 2013-08-28 MED ORDER — ALBUTEROL SULFATE HFA 108 (90 BASE) MCG/ACT IN AERS
4.0000 | INHALATION_SPRAY | Freq: Once | RESPIRATORY_TRACT | Status: AC
  Administered 2013-08-28: 4 via RESPIRATORY_TRACT
  Filled 2013-08-28: qty 6.7

## 2013-08-28 MED ORDER — PREDNISONE 10 MG PO TABS: 60.0000 mg | ORAL_TABLET | Freq: Every day | ORAL | Status: AC

## 2013-08-28 MED ORDER — PREDNISONE 20 MG PO TABS
60.0000 mg | ORAL_TABLET | Freq: Once | ORAL | Status: AC
  Administered 2013-08-28: 60 mg via ORAL
  Filled 2013-08-28: qty 3

## 2013-08-28 MED ORDER — BENZONATATE 100 MG PO CAPS: 100.0000 mg | ORAL_CAPSULE | Freq: Three times a day (TID) | ORAL | Status: DC

## 2013-08-28 NOTE — ED Notes (Signed)
Resident at bedside.  

## 2013-08-28 NOTE — ED Notes (Signed)
Dr Miller at bedside. 

## 2013-08-28 NOTE — ED Notes (Addendum)
C/o body aches, chills, cough with yellow mucous, runny nose x 2 weeks. States hes been coughing so much his chest "Feels sore." pt had pneumonia last year and this feels the same. pcp prescribed zpak but symptoms persist

## 2013-08-28 NOTE — ED Provider Notes (Signed)
47 year old male with history of smoking, daily, presents with several weeks of ongoing coughing which is occasionally productive, he now has ongoing mild shortness of breath which did not improve with a Z-Pak, has not been prescribed a bronchodilator therapy nor prednisone. On exam he has clear heart and lung sounds, no rales or wheezing, no increased work of breathing, speaks in full sentences, no asymmetry or swelling of the legs. Chest x-ray negative, patient appears benign and likely has a bronchitis.  Diagnosis  #1 bronchitis  I personally interpreted the EKG as well as the resident and agree with the interpretation on the resident's chart.   Vida Roller, MD 08/29/13 1640

## 2013-08-28 NOTE — ED Provider Notes (Signed)
CSN: [id number]     Arrival date & time 08/28/13  1311 History   First MD Initiated Contact with Patient 08/28/13 1531     Chief Complaint  Patient presents with  . Generalized Body Aches   (Consider location/radiation/quality/duration/timing/severity/associated sxs/prior Treatment) HPI Comments: 47 y/o male with one week of cough productive of yellow sputum and shortness of breath. Associated with two week of runny nose, generalized malaise, and chills. Left chest wall soreness developed after coughing for many days.  Seen by his PCP and began Zpack which he finished yesterday. He reports no improvement. History of 25 years of cigarette smoking but no diagnosis of COPD. Denies n/v, abdominal pain, sore throat.   The history is provided by the patient. No language interpreter was used.    Past Medical History  Diagnosis Date  . Myocardial infarction     2002; treated with stent to mid L circumflex  . Heart disease   . Hypercholesteremia   . Coronary artery disease   . NSTEMI (non-ST elevated myocardial infarction) 10/18/2011  . CAD (coronary artery disease), native coronary artery 04/18/2008  . Diabetes mellitus     Takes Metformin  . Hypertension     Takes Lisinopril   . GERD (gastroesophageal reflux disease)     Takes Dexilant  . Angina     Takes Isosorbide & Nitroglycerin  . Headache(784.0)   . Pneumonia     approx 2 years ago  . Bronchitis     hx of  . Shortness of breath     Occurs while laying down at times  . History of nuclear stress test 07/29/2011    decreased activity in septum on stress images compared with rest; wall motion abnormality, EF 53%  . Rhabdomyolysis     h/o, r/t statins  . H/O cardiac catheterization     with 5 separate dated interventions   Past Surgical History  Procedure Laterality Date  . Coronary angioplasty with stent placement  10/09/2001    PTCA & stenting of mid AV circumflex; 2.5x43mm Pixel stent  . Colonoscopy w/ polypectomy    .  Lumbar laminectomy/decompression microdiscectomy  03/31/2012    Procedure: LUMBAR LAMINECTOMY/DECOMPRESSION MICRODISCECTOMY 1 LEVEL;  Surgeon: Temple Pacini, MD;  Location: [facility];  Service: Neurosurgery;  Laterality: Left;  . Transthoracic echocardiogram  07/28/2011    EF 55-65%; LVH, grade 1 diastolic dysfunction;   . Coronary angioplasty with stent placement  12/13/2001    PCI with stent to mid L circumflex, 95% stenosis to 0% residual  . Coronary angioplasty with stent placement  10/10/2003    PCI to mid AV circumflex; LAD 30% disease; RCA 100% occluded prox.  . Coronary angioplasty with stent placement  09/01/2011    PCI with stenting with bare metal stent to mid AV groove circumflex and PDA  . Coronary angioplasty with stent placement  10/17/2011    cutting balloon angioplasty of ostial lateral OM1 branch and bifurcation AV groove circumflex OM junction; stenosis reduced to 0%   Family History  Problem Relation Age of Onset  . Anesthesia problems Neg Hx   . Hypotension Neg Hx   . Malignant hyperthermia Neg Hx   . Pseudochol deficiency Neg Hx   . Hypertension Mother   . Heart attack Father   . Diabetes Mother   . Heart disease Brother     x 3   . Heart attack Brother     deceased  . Hypertension Sister   . Diabetes Sister  History  Substance Use Topics  . Smoking status: Current Some Day Smoker -- 25 years    Types: Cigarettes  . Smokeless tobacco: Never Used     Comment: 1 pack lasts about 2 weeks  . Alcohol Use: No    Review of Systems  Constitutional: Positive for fever, chills, activity change and fatigue. Negative for appetite change.  Respiratory: Positive for cough and shortness of breath.   Cardiovascular: Positive for chest pain. Negative for palpitations and leg swelling.  Gastrointestinal: Negative for nausea, vomiting, abdominal pain and abdominal distention.  All other systems reviewed and are negative.    Allergies  Iohexol and Vicodin  Home  Medications   Current Outpatient Rx  Name  Route  Sig  Dispense  Refill  . amoxicillin (AMOXIL) 500 MG capsule   Oral   Take 1 capsule by mouth daily.         Marland Kitchen aspirin 81 MG tablet   Oral   Take 81 mg by mouth daily.         . chlorhexidine (PERIDEX) 0.12 % solution      daily.         . clomiPHENE (CLOMID) 50 MG tablet   Oral   Take 25 mg by mouth daily.         . clopidogrel (PLAVIX) 75 MG tablet   Oral   Take 1 tablet (75 mg total) by mouth daily.   30 tablet   6   . cyclobenzaprine (FLEXERIL) 5 MG tablet   Oral   Take 1 tablet (5 mg total) by mouth 3 (three) times daily as needed for muscle spasms.   30 tablet   0   . dexlansoprazole (DEXILANT) 60 MG capsule   Oral   Take 60 mg by mouth daily.         Marland Kitchen gabapentin (NEURONTIN) 100 MG capsule   Oral   Take 100 mg by mouth 2 (two) times daily.         . isosorbide mononitrate (IMDUR) 30 MG 24 hr tablet      TAKE TWO TABLETS BY MOUTH ONCE DAILY AS  DIRECTED. PATIENT NEEDS TO CALL OFFICE TO SCHEDULE AN OPPOINTMENT.   15 tablet   0   . lisinopril-hydrochlorothiazide (PRINZIDE,ZESTORETIC) 20-12.5 MG per tablet   Oral   Take 0.5 tablets by mouth daily.          . meloxicam (MOBIC) 15 MG tablet   Oral   Take 1 tablet (15 mg total) by mouth daily.   15 tablet   0   . metFORMIN (GLUCOPHAGE) 1000 MG tablet   Oral   Take 1,000 mg by mouth 2 (two) times daily with a meal. Restart this medication on Thursday, Oct 21, 2011.         . metoprolol (LOPRESSOR) 50 MG tablet   Oral   Take 50 mg by mouth 2 (two) times daily.          . nitroGLYCERIN (NITROSTAT) 0.4 MG SL tablet   Sublingual   Place 0.4 mg under the tongue every 5 (five) minutes as needed for chest pain.         Marland Kitchen oxyCODONE-acetaminophen (PERCOCET) 5-325 MG per tablet      1 to 2 tablets every 6 hours as needed for pain.   20 tablet   0   . oxyCODONE-acetaminophen (PERCOCET/ROXICET) 5-325 MG per tablet   Oral   Take 2  tablets by mouth every 4 (four) hours  as needed for pain.   16 tablet   0   . pravastatin (PRAVACHOL) 40 MG tablet   Oral   Take 40 mg by mouth every morning.          . predniSONE (DELTASONE) 20 MG tablet   Oral   Take 1 tablet (20 mg total) by mouth 2 (two) times daily.   10 tablet   0   . simvastatin (ZOCOR) 20 MG tablet   Oral   Take 1 tablet (20 mg total) by mouth at bedtime.   30 tablet   11   . traMADol (ULTRAM) 50 MG tablet   Oral   Take 50 mg by mouth as needed.         . valsartan-hydrochlorothiazide (DIOVAN-HCT) 320-12.5 MG per tablet   Oral   Take 1 tablet by mouth daily.          BP 134/91  Pulse 97  Temp(Src) 98.7 F (37.1 C) (Oral)  Resp 18  Ht 5\' 8"  (1.727 m)  Wt 273 lb 1.6 oz (123.877 kg)  BMI 41.53 kg/m2  SpO2 95% Physical Exam  Vitals reviewed. Constitutional: He is oriented to person, place, and time. He appears well-developed and well-nourished. No distress.  HENT:  Mouth/Throat: Oropharynx is clear and moist.  Eyes: Conjunctivae are normal.  Neck: Neck supple. No JVD present. No tracheal deviation present.  Cardiovascular: Normal rate, regular rhythm and normal heart sounds.   Pulmonary/Chest: Effort normal. No stridor. He has rhonchi (right lung fields).  Abdominal: Soft. Bowel sounds are normal. He exhibits no distension. There is no tenderness.  Musculoskeletal: He exhibits no edema.  Neurological: He is alert and oriented to person, place, and time.  Skin: Skin is warm and dry.    ED Course  Procedures (including critical care time) Labs Review Labs Reviewed  CBC  BASIC METABOLIC PANEL  PRO B NATRIURETIC PEPTIDE  POCT I-STAT TROPONIN I   Imaging Review Dg Chest 2 View  08/28/2013   CLINICAL DATA:  Body aches and chills  EXAM: CHEST  2 VIEW  COMPARISON:  12/18/2012  FINDINGS: The heart size and mediastinal contours are within normal limits. Both lungs are clear. The visualized skeletal structures are unremarkable.   IMPRESSION: No active cardiopulmonary disease.   Electronically Signed   By: Amie Portland M.D.   On: 08/28/2013 14:27    Date: 08/28/2013  Rate: 98  Rhythm: normal sinus rhythm  QRS Axis: normal  Intervals: normal  ST/T Wave abnormalities: normal  Conduction Disutrbances:none  Narrative Interpretation: sinus rhythm, no ischemic changes  Old EKG Reviewed: unchanged    MDM   1. Bronchitis     47 Y/o male with history of DM, CAD, MI, HTN presenting with productive cough and SOB. Completed Z pack yesterday. Subjective fever and chills at home with generalized malaise and left lateral chest wall pain with cough. Afebrile, vitals normal. No respiratory distress. Rhonchi in right lung fields. No LE swelling. CXR without consolidation or pneumothorax. PE unlikely in this setting. EKG without ischemic changes. No lab abnormalities on CBC, BMP, troponin, BNP. Given smoking history, will give trial albuterol and prednisone. Already appropriately treated for CAP, no further abx indicated. Recommend symptomatic management with PCP follow up if symptoms persist. Return precautions discussed and patient voiced understanding of instructions.   Labs and imaging reviewed in my medical decision making if ordered. Patient discussed with my attending, Dr. Vaughan Basta, MD 08/29/13 1240

## 2013-08-28 NOTE — ED Notes (Signed)
PT comfortable with d/c and f/u instructions. Prescriptions x2 

## 2013-08-29 NOTE — ED Provider Notes (Signed)
I saw and evaluated the patient, reviewed the resident's note and I agree with the findings and plan.  Please see my separate note regarding my evaluation of the patient.    Vida Roller, MD 08/29/13 [phone]

## 2013-09-07 ENCOUNTER — Telehealth: Payer: Self-pay | Admitting: *Deleted

## 2013-09-07 ENCOUNTER — Other Ambulatory Visit: Payer: Self-pay | Admitting: Internal Medicine

## 2013-09-07 MED ORDER — NITROGLYCERIN 0.4 MG SL SUBL: 0.4000 mg | SUBLINGUAL_TABLET | SUBLINGUAL | Status: DC | PRN

## 2013-09-07 MED ORDER — METOPROLOL TARTRATE 50 MG PO TABS: 50.0000 mg | ORAL_TABLET | Freq: Two times a day (BID) | ORAL | Status: DC

## 2013-09-07 NOTE — Telephone Encounter (Signed)
Called patient to clarify metoprolol tartrate dosage (confirmed it is 50mg  bid). While speaking with patient, he informed RN that he has had CPs (described as sometimes like a needle in chest and sometimes a "like a truck is on chest" lasting about 2-3 minutes, then dissipate, and then return a few hours later). Informed RN that has had to miss work r/t to this CP. He is also c/o SOB. Informed RN that he has taken NTG SL (but that it is about 20.47 years old) and has some relief. Informed patient that NTG would be refilled, and that PA/Dr. Rennis Golden would be notified of his symptoms. Informed patient that he should go to ED if symptoms do not improve or get worse, and patient stated he thought about coming to doctor but thought the symptoms would go away - patient aware of need to have symptoms evaluated. Patient also stated he wants to come in to see Dr. Rennis Golden to discuss potential disability. Informed patient scheduling department would be notified.

## 2013-09-10 NOTE — Telephone Encounter (Signed)
Thanks . i'll see him in the office.  -Dr. Rennis Golden

## 2013-09-20 ENCOUNTER — Ambulatory Visit: Payer: BC Managed Care – PPO | Admitting: Internal Medicine

## 2013-09-29 ENCOUNTER — Telehealth: Payer: Self-pay | Admitting: Cardiology

## 2013-09-29 NOTE — Telephone Encounter (Signed)
Pt's family called stating Mr Paskett was having severe pain in his leg.  I instructed them to go to ER or urgent care for eval.  I could not order anything over the phone.

## 2013-10-04 ENCOUNTER — Encounter (HOSPITAL_COMMUNITY): Payer: Self-pay | Admitting: Emergency Medicine

## 2013-10-04 ENCOUNTER — Emergency Department (INDEPENDENT_AMBULATORY_CARE_PROVIDER_SITE_OTHER)
Admission: EM | Admit: 2013-10-04 | Discharge: 2013-10-04 | Disposition: A | Discharge status: Home / Self Care | Payer: BC Managed Care – PPO | Source: Home / Self Care

## 2013-10-04 DIAGNOSIS — M545 Low back pain, unspecified: Secondary | ICD-10-CM

## 2013-10-04 MED ORDER — METHYLPREDNISOLONE ACETATE 80 MG/ML IJ SUSP
80.0000 mg | Freq: Once | INTRAMUSCULAR | Status: AC
  Administered 2013-10-04: 80 mg via INTRAMUSCULAR

## 2013-10-04 MED ORDER — OXYCODONE-ACETAMINOPHEN 5-325 MG PO TABS: ORAL_TABLET | ORAL | Status: DC

## 2013-10-04 MED ORDER — PREDNISONE 10 MG PO KIT: PACK | ORAL | Status: DC

## 2013-10-04 MED ORDER — METHYLPREDNISOLONE ACETATE 80 MG/ML IJ SUSP
INTRAMUSCULAR | Status: AC
  Filled 2013-10-04: qty 1

## 2013-10-04 MED ORDER — AMLODIPINE BESYLATE 10 MG PO TABS: 10.0000 mg | ORAL_TABLET | Freq: Every day | ORAL | Status: DC

## 2013-10-04 NOTE — ED Notes (Signed)
C/o back pain for a while now. States he had surgery last year for a bulging disc in her back by Dr. Dutch Quint  Heating pad and hot water used as therapy

## 2013-10-04 NOTE — ED Provider Notes (Signed)
Andrew West is a 47 y.o. male who presents to Urgent Care today for back pain present for several weeks. Patient notes bilateral low back pain. He denies any new injury. He denies a significant radiating pain weakness numbness bowel or bladder dysfunction or difficulty walking. He has tried some ibuprofen and Tylenol which has helped a bit.  He has a pertinent past surgical history for laminectomy by Dr. Dutch Quint around one year ago. He additionally has chronic intermittent low back pain which is managed with oxycodone by his primary care provider.  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 He feels well otherwise.   He denies any chest pains palpitations shortness of breath dizziness or lightheadedness. He is a little unclear of what blood pressure medication he is taking. It appears that he may only be taking metoprolol for blood pressure.   Past Medical History  Diagnosis Date  . Myocardial infarction     2002; treated with stent to mid L circumflex  . Heart disease   . Hypercholesteremia   . Coronary artery disease   . NSTEMI (non-ST elevated myocardial infarction) 10/18/2011  . CAD (coronary artery disease), native coronary artery 04/18/2008  . Diabetes mellitus     Takes Metformin  . Hypertension     Takes Lisinopril   . GERD (gastroesophageal reflux disease)     Takes Dexilant  . Angina     Takes Isosorbide & Nitroglycerin  . Headache(784.0)   . Pneumonia     approx 2 years ago  . Bronchitis     hx of  . Shortness of breath     Occurs while laying down at times  . History of nuclear stress test 07/29/2011    decreased activity in septum on stress images compared with rest; wall motion abnormality, EF 53%  . Rhabdomyolysis     h/o, r/t statins  . H/O cardiac catheterization     with 5 separate dated interventions   History  Substance Use Topics  . Smoking status: Current Some Day Smoker -- 25 years    Types: Cigarettes  . Smokeless tobacco: Never Used   Comment: 1 pack lasts about 2 weeks  . Alcohol Use: No   ROS as above Medications reviewed. No current facility-administered medications for this encounter.   Current Outpatient Prescriptions  Medication Sig Dispense Refill  . amLODipine (NORVASC) 10 MG tablet Take 1 tablet (10 mg total) by mouth daily.  30 tablet  0  . aspirin 81 MG tablet Take 81 mg by mouth daily.      . clopidogrel (PLAVIX) 75 MG tablet Take 1 tablet (75 mg total) by mouth daily.  30 tablet  6  . cyclobenzaprine (FLEXERIL) 5 MG tablet Take 1 tablet (5 mg total) by mouth 3 (three) times daily as needed for muscle spasms.  30 tablet  0  . dexlansoprazole (DEXILANT) 60 MG capsule Take 60 mg by mouth daily.      Marland Kitchen ibuprofen (ADVIL,MOTRIN) 200 MG tablet Take 200 mg by mouth every 6 (six) hours as needed for pain.      . isosorbide mononitrate (IMDUR) 30 MG 24 hr tablet TAKE TWO TABLETS BY MOUTH ONCE DAILY AS  DIRECTED. PATIENT NEEDS TO CALL OFFICE TO SCHEDULE AN OPPOINTMENT.  15 tablet  0  . lisinopril-hydrochlorothiazide (PRINZIDE,ZESTORETIC) 20-12.5 MG per tablet Take 0.5 tablets by mouth daily.       . meloxicam (MOBIC) 15 MG tablet Take 1 tablet (15 mg total) by  mouth daily.  15 tablet  0  . metFORMIN (GLUCOPHAGE) 1000 MG tablet Take 1,000 mg by mouth 2 (two) times daily with a meal. Restart this medication on Thursday, Oct 21, 2011.      . metoprolol (LOPRESSOR) 50 MG tablet Take 1 tablet (50 mg total) by mouth 2 (two) times daily.  60 tablet  10  . nitroGLYCERIN (NITROSTAT) 0.4 MG SL tablet Place 1 tablet (0.4 mg total) under the tongue every 5 (five) minutes as needed for chest pain.  25 tablet  3  . oxyCODONE-acetaminophen (PERCOCET) 5-325 MG per tablet 1 to 2 tablets every 6 hours as needed for pain.  20 tablet  0  . pravastatin (PRAVACHOL) 40 MG tablet Take 40 mg by mouth every morning.       . PredniSONE 10 MG KIT 12 day dose pack po  1 kit  0  . simvastatin (ZOCOR) 20 MG tablet Take 20 mg by mouth daily.      .  valsartan-hydrochlorothiazide (DIOVAN-HCT) 320-12.5 MG per tablet Take 1 tablet by mouth daily.        Exam:  BP 175/127  Pulse 88  Temp(Src) 98.1 F (36.7 C) (Oral)  Resp 20  SpO2 96% Gen: Well NAD HEENT: EOMI,  MMM Lungs: CTABL Nl WOB Heart: RRR no MRG Abd: NABS, NT, ND Exts: Non edematous BL  LE, warm and well perfused.  Back: Tender to light touch across the majority of the low back. Not particularly tender along the midline. Range of motion is limited in flexion but normal extension and rotation. Strength is intact bilateral lower extremities. Patient can get on and off the table walk squat and stand on toes and heels. Sensation is intact throughout  No results found for this or any previous visit (from the past 24 hour(s)). No results found.  Assessment and Plan: 47 y.o. male with  1) lumbago:  Plan to treat with Depo-Medrol injection followed by 12 day prednisone dose pack. I had a lengthy discussion regarding the nature of his back pain. Advised weight loss exercise and follow up with primary care provider. Patient may ultimately benefit from referral to chronic pain management. 2) hypertension: I am not sure what the patient is taking. His prescriptions for ACE inhibitor and ARB medications. At this point I don't think is taking either. I'm not sure if he's taking Imdur as well. I will start amlodipine and refer patient back to his primary care provider for further blood pressure management. Patient is not currently symptomatic.  Discussed warning signs or symptoms. Please see discharge instructions. Patient expresses understanding.      Rodolph Bong, MD 10/04/13 [phone]

## 2013-10-31 ENCOUNTER — Emergency Department (INDEPENDENT_AMBULATORY_CARE_PROVIDER_SITE_OTHER)
Admission: EM | Admit: 2013-10-31 | Discharge: 2013-10-31 | Disposition: A | Discharge status: Home / Self Care | Payer: BC Managed Care – PPO | Source: Home / Self Care | Attending: Family Medicine | Admitting: Family Medicine

## 2013-10-31 ENCOUNTER — Encounter (HOSPITAL_COMMUNITY): Payer: Self-pay | Admitting: Emergency Medicine

## 2013-10-31 DIAGNOSIS — M545 Low back pain: Secondary | ICD-10-CM

## 2013-10-31 MED ORDER — CYCLOBENZAPRINE HCL 5 MG PO TABS: 5.0000 mg | ORAL_TABLET | Freq: Three times a day (TID) | ORAL | Status: DC | PRN

## 2013-10-31 MED ORDER — PREDNISONE 10 MG PO KIT: PACK | ORAL | Status: DC

## 2013-10-31 NOTE — ED Notes (Signed)
Pt c/o lower back pain onset this am.... States he was p/u 90 lbs tables at work Hx of back surgery back... Took ibup and tyle around 1200 Slow steady gait He is alert w/no signs of acute distress.

## 2013-10-31 NOTE — ED Provider Notes (Signed)
Andrew West is a 47 y.o. male who presents to Urgent Care today for left lower back pain starting today. This occurred after attempting to lift a 90 pound table. The pain is severe and worse with activity. Patient notes pain radiating to his right leg. He denies any weakness or numbness bowel bladder dysfunction or difficulty walking. He is taking his current pain medication. He has similar episode about one month ago that was well treated with prednisone, and Flexeril.   He has a pertinent past surgical history for laminectomy about one year ago.   Past Medical History  Diagnosis Date  . Myocardial infarction     2002; treated with stent to mid L circumflex  . Heart disease   . Hypercholesteremia   . Coronary artery disease   . NSTEMI (non-ST elevated myocardial infarction) 10/18/2011  . CAD (coronary artery disease), native coronary artery 04/18/2008  . Diabetes mellitus     Takes Metformin  . Hypertension     Takes Lisinopril   . GERD (gastroesophageal reflux disease)     Takes Dexilant  . Angina     Takes Isosorbide & Nitroglycerin  . Headache(784.0)   . Pneumonia     approx 2 years ago  . Bronchitis     hx of  . Shortness of breath     Occurs while laying down at times  . History of nuclear stress test 07/29/2011    decreased activity in septum on stress images compared with rest; wall motion abnormality, EF 53%  . Rhabdomyolysis     h/o, r/t statins  . H/O cardiac catheterization     with 5 separate dated interventions   History  Substance Use Topics  . Smoking status: Current Some Day Smoker -- 25 years    Types: Cigarettes  . Smokeless tobacco: Never Used     Comment: 1 pack lasts about 2 weeks  . Alcohol Use: No   ROS as above Medications reviewed. No current facility-administered medications for this encounter.   Current Outpatient Prescriptions  Medication Sig Dispense Refill  . amLODipine (NORVASC) 10 MG tablet Take 1 tablet (10 mg total) by mouth  daily.  30 tablet  0  . aspirin 81 MG tablet Take 81 mg by mouth daily.      . clopidogrel (PLAVIX) 75 MG tablet Take 1 tablet (75 mg total) by mouth daily.  30 tablet  6  . cyclobenzaprine (FLEXERIL) 5 MG tablet Take 1 tablet (5 mg total) by mouth 3 (three) times daily as needed for muscle spasms.  30 tablet  0  . dexlansoprazole (DEXILANT) 60 MG capsule Take 60 mg by mouth daily.      Marland Kitchen ibuprofen (ADVIL,MOTRIN) 200 MG tablet Take 200 mg by mouth every 6 (six) hours as needed for pain.      . isosorbide mononitrate (IMDUR) 30 MG 24 hr tablet TAKE TWO TABLETS BY MOUTH ONCE DAILY AS  DIRECTED. PATIENT NEEDS TO CALL OFFICE TO SCHEDULE AN OPPOINTMENT.  15 tablet  0  . lisinopril-hydrochlorothiazide (PRINZIDE,ZESTORETIC) 20-12.5 MG per tablet Take 0.5 tablets by mouth daily.       . meloxicam (MOBIC) 15 MG tablet Take 1 tablet (15 mg total) by mouth daily.  15 tablet  0  . metFORMIN (GLUCOPHAGE) 1000 MG tablet Take 1,000 mg by mouth 2 (two) times daily with a meal. Restart this medication on Thursday, Oct 21, 2011.      . metoprolol (LOPRESSOR) 50 MG tablet Take 1 tablet (50  mg total) by mouth 2 (two) times daily.  60 tablet  10  . nitroGLYCERIN (NITROSTAT) 0.4 MG SL tablet Place 1 tablet (0.4 mg total) under the tongue every 5 (five) minutes as needed for chest pain.  25 tablet  3  . oxyCODONE-acetaminophen (PERCOCET) 5-325 MG per tablet 1 to 2 tablets every 6 hours as needed for pain.  20 tablet  0  . pravastatin (PRAVACHOL) 40 MG tablet Take 40 mg by mouth every morning.       . PredniSONE 10 MG KIT 12 day dose pack po  1 kit  0  . simvastatin (ZOCOR) 20 MG tablet Take 20 mg by mouth daily.      . valsartan-hydrochlorothiazide (DIOVAN-HCT) 320-12.5 MG per tablet Take 1 tablet by mouth daily.        Exam:  BP 140/92  Pulse 88  Temp(Src) 98.7 F (37.1 C) (Oral)  Resp 24  SpO2 94% Gen: Well NAD Back: Tender to light touch across the majority of the low back. Not particularly tender along the  midline.  Range of motion is limited in flexion but normal extension and rotation.  Strength is intact bilateral lower extremities. Patient can get on and off the table walk squat and stand on toes and heels.  Sensation is intact throughout   Assessment and Plan: 47 y.o. male with lumbago with sciatica. Plan to 2 with prednisone and Flexeril. Followup with neurosurgeon. Discussed warning signs or symptoms. Please see discharge instructions. Patient expresses understanding.      Rodolph Bong, MD 10/31/13 [phone]

## 2013-12-05 ENCOUNTER — Ambulatory Visit: Payer: Self-pay | Admitting: Podiatrist

## 2013-12-07 ENCOUNTER — Ambulatory Visit: Payer: Self-pay | Admitting: Podiatrist

## 2013-12-19 ENCOUNTER — Other Ambulatory Visit: Payer: Self-pay | Admitting: Internal Medicine

## 2013-12-19 NOTE — Telephone Encounter (Signed)
Rx was sent to pharmacy electronically. 

## 2013-12-21 ENCOUNTER — Emergency Department (HOSPITAL_COMMUNITY)
Admission: EM | Admit: 2013-12-21 | Discharge: 2013-12-21 | Disposition: A | Discharge status: Home / Self Care | Payer: BC Managed Care – PPO | Attending: Emergency Medicine | Admitting: Emergency Medicine

## 2013-12-21 ENCOUNTER — Encounter (HOSPITAL_COMMUNITY): Payer: Self-pay | Admitting: Emergency Medicine

## 2013-12-21 DIAGNOSIS — Z79899 Other long term (current) drug therapy: Secondary | ICD-10-CM | POA: Insufficient documentation

## 2013-12-21 DIAGNOSIS — K029 Dental caries, unspecified: Secondary | ICD-10-CM | POA: Insufficient documentation

## 2013-12-21 DIAGNOSIS — E7800 Pure hypercholesterolemia, unspecified: Secondary | ICD-10-CM | POA: Insufficient documentation

## 2013-12-21 DIAGNOSIS — Z8739 Personal history of other diseases of the musculoskeletal system and connective tissue: Secondary | ICD-10-CM | POA: Insufficient documentation

## 2013-12-21 DIAGNOSIS — I2510 Atherosclerotic heart disease of native coronary artery without angina pectoris: Secondary | ICD-10-CM | POA: Insufficient documentation

## 2013-12-21 DIAGNOSIS — J029 Acute pharyngitis, unspecified: Secondary | ICD-10-CM | POA: Insufficient documentation

## 2013-12-21 DIAGNOSIS — H9209 Otalgia, unspecified ear: Secondary | ICD-10-CM | POA: Insufficient documentation

## 2013-12-21 DIAGNOSIS — Z9861 Coronary angioplasty status: Secondary | ICD-10-CM | POA: Insufficient documentation

## 2013-12-21 DIAGNOSIS — F17200 Nicotine dependence, unspecified, uncomplicated: Secondary | ICD-10-CM | POA: Insufficient documentation

## 2013-12-21 DIAGNOSIS — Z7982 Long term (current) use of aspirin: Secondary | ICD-10-CM | POA: Insufficient documentation

## 2013-12-21 DIAGNOSIS — I209 Angina pectoris, unspecified: Secondary | ICD-10-CM | POA: Insufficient documentation

## 2013-12-21 DIAGNOSIS — K047 Periapical abscess without sinus: Secondary | ICD-10-CM | POA: Insufficient documentation

## 2013-12-21 DIAGNOSIS — Z791 Long term (current) use of non-steroidal anti-inflammatories (NSAID): Secondary | ICD-10-CM | POA: Insufficient documentation

## 2013-12-21 DIAGNOSIS — K219 Gastro-esophageal reflux disease without esophagitis: Secondary | ICD-10-CM | POA: Insufficient documentation

## 2013-12-21 DIAGNOSIS — Z8701 Personal history of pneumonia (recurrent): Secondary | ICD-10-CM | POA: Insufficient documentation

## 2013-12-21 DIAGNOSIS — I10 Essential (primary) hypertension: Secondary | ICD-10-CM | POA: Insufficient documentation

## 2013-12-21 DIAGNOSIS — E119 Type 2 diabetes mellitus without complications: Secondary | ICD-10-CM | POA: Insufficient documentation

## 2013-12-21 DIAGNOSIS — I252 Old myocardial infarction: Secondary | ICD-10-CM | POA: Insufficient documentation

## 2013-12-21 DIAGNOSIS — Z7902 Long term (current) use of antithrombotics/antiplatelets: Secondary | ICD-10-CM | POA: Insufficient documentation

## 2013-12-21 MED ORDER — HYDROCODONE-ACETAMINOPHEN 5-325 MG PO TABS: 1.0000 | ORAL_TABLET | ORAL | Status: DC | PRN

## 2013-12-21 MED ORDER — AMOXICILLIN 500 MG PO CAPS: 500.0000 mg | ORAL_CAPSULE | Freq: Three times a day (TID) | ORAL | Status: DC

## 2013-12-21 NOTE — Discharge Instructions (Signed)
Dental Pain A tooth ache may be caused by cavities (tooth decay). Cavities expose the nerve of the tooth to air and hot or cold temperatures. It may come from an infection or abscess (also called a boil or furuncle) around your tooth. It is also often caused by dental caries (tooth decay). This causes the pain you are having. DIAGNOSIS  Your caregiver can diagnose this problem by exam. TREATMENT   If caused by an infection, it may be treated with medications which kill germs (antibiotics) and pain medications as prescribed by your caregiver. Take medications as directed.  Only take over-the-counter or prescription medicines for pain, discomfort, or fever as directed by your caregiver.  Whether the tooth ache today is caused by infection or dental disease, you should see your dentist as soon as possible for further care. SEEK MEDICAL CARE IF: The exam and treatment you received today has been provided on an emergency basis only. This is not a substitute for complete medical or dental care. If your problem worsens or new problems (symptoms) appear, and you are unable to meet with your dentist, call or return to this location. SEEK IMMEDIATE MEDICAL CARE IF:   You have a fever.  You develop redness and swelling of your face, jaw, or neck.  You are unable to open your mouth.  You have severe pain uncontrolled by pain medicine. MAKE SURE YOU:   Understand these instructions.  Will watch your condition.  Will get help right away if you are not doing well or get worse. Document Released: 11/08/2005 Document Revised: 01/31/2012 Document Reviewed: 06/26/2008 [facility] Patient Information 2014 [place].  Dental Abscess A dental abscess is a collection of infected fluid (pus) from a bacterial infection in the inner part of the tooth (pulp). It usually occurs at the end of the tooth's root.  CAUSES   Severe tooth decay.  Trauma to the tooth that allows bacteria to enter into the pulp,  such as a broken or chipped tooth. SYMPTOMS   Severe pain in and around the infected tooth.  Swelling and redness around the abscessed tooth or in the mouth or face.  Tenderness.  Pus drainage.  Bad breath.  Bitter taste in the mouth.  Difficulty swallowing.  Difficulty opening the mouth.  Nausea.  Vomiting.  Chills.  Swollen neck glands. DIAGNOSIS   A medical and dental history will be taken.  An examination will be performed by tapping on the abscessed tooth.  X-rays may be taken of the tooth to identify the abscess. TREATMENT The goal of treatment is to eliminate the infection. You may be prescribed antibiotic medicine to stop the infection from spreading. A root canal may be performed to save the tooth. If the tooth cannot be saved, it may be pulled (extracted) and the abscess may be drained.  HOME CARE INSTRUCTIONS  Only take over-the-counter or prescription medicines for pain, fever, or discomfort as directed by your caregiver.  Rinse your mouth (gargle) often with salt water ( tsp salt in 8 oz [250 ml] of warm water) to relieve pain or swelling.  Do not drive after taking pain medicine (narcotics).  Do not apply heat to the outside of your face.  Return to your dentist for further treatment as directed. SEEK MEDICAL CARE IF:  Your pain is not helped by medicine.  Your pain is getting worse instead of better. SEEK IMMEDIATE MEDICAL CARE IF:  You have a fever or persistent symptoms for more than 2 3 days.  You have a fever and your symptoms suddenly get worse.  You have chills or a very bad headache.  You have problems breathing or swallowing.  You have trouble opening your mouth.  You have swelling in the neck or around the eye. Document Released: 11/08/2005 Document Revised: 08/02/2012 Document Reviewed: 02/16/2011 [facility] Patient Information 2014 [place], [place].  Pharyngitis Pharyngitis is redness, pain, and swelling (inflammation) of  your pharynx.  CAUSES  Pharyngitis is usually caused by infection. Most of the time, these infections are from viruses (viral) and are part of a cold. However, sometimes pharyngitis is caused by bacteria (bacterial). Pharyngitis can also be caused by allergies. Viral pharyngitis may be spread from person to person by coughing, sneezing, and personal items or utensils (cups, forks, spoons, toothbrushes). Bacterial pharyngitis may be spread from person to person by more intimate contact, such as kissing.  SIGNS AND SYMPTOMS  Symptoms of pharyngitis include:   Sore throat.   Tiredness (fatigue).   Low-grade fever.   Headache.  Joint pain and muscle aches.  Skin rashes.  Swollen lymph nodes.  Plaque-like film on throat or tonsils (often seen with bacterial pharyngitis). DIAGNOSIS  Your health care provider will ask you questions about your illness and your symptoms. Your medical history, along with a physical exam, is often all that is needed to diagnose pharyngitis. Sometimes, a rapid strep test is done. Other lab tests may also be done, depending on the suspected cause.  TREATMENT  Viral pharyngitis will usually get better in 3 4 days without the use of medicine. Bacterial pharyngitis is treated with medicines that kill germs (antibiotics).  HOME CARE INSTRUCTIONS   Drink enough water and fluids to keep your urine clear or pale yellow.   Only take over-the-counter or prescription medicines as directed by your health care provider:   If you are prescribed antibiotics, make sure you finish them even if you start to feel better.   Do not take aspirin.   Get lots of rest.   Gargle with 8 oz of salt water ( tsp of salt per 1 qt of water) as often as every 1 2 hours to soothe your throat.   Throat lozenges (if you are not at risk for choking) or sprays may be used to soothe your throat. SEEK MEDICAL CARE IF:   You have large, tender lumps in your neck.  You have a  rash.  You cough up green, yellow-brown, or bloody spit. SEEK IMMEDIATE MEDICAL CARE IF:   Your neck becomes stiff.  You drool or are unable to swallow liquids.  You vomit or are unable to keep medicines or liquids down.  You have severe pain that does not go away with the use of recommended medicines.  You have trouble breathing (not caused by a stuffy nose). MAKE SURE YOU:   Understand these instructions.  Will watch your condition.  Will get help right away if you are not doing well or get worse. Document Released: 11/08/2005 Document Revised: 08/29/2013 Document Reviewed: 07/16/2013 [facility] Patient Information 2014 [place].

## 2013-12-21 NOTE — ED Notes (Signed)
Pt reports a sore throat and feeling like his throat os swollen over the past couple of days. Also reports he has broken in his mouth and has been having pain. Reports he has a dentist appt next month. No other complaints.

## 2013-12-21 NOTE — ED Provider Notes (Signed)
CSN: [id number]     Arrival date & time 12/21/13  1621 History  This chart was scribed for non-physician practitioner Wille Glaser, PA-C working with Jasper Riling. Alvino Chapel, MD by Eston Mould, ED Scribe. This patient was seen in room TR08C/TR08C and the patient's care was started at 5:10 PM .   Chief Complaint  Patient presents with  . Sore Throat  . Dental Pain   The history is provided by the patient. No language interpreter was used.  HPI Comments: Andrew West is a 48 y.o. male who presents to the Emergency Department complaining of ongoing worsening sore throat x 3 days and dental pain "for a while" with R associated ear pain , pain with swallowing, ongoing cough with sputum. Pt states he feels as if he is choking and reports having "bad teeth that are causing constant pain". He states he is awaiting to be seen by his dentist, which will be next month. He states his dentist has mentioned in the past "they would like to extract all his teeth". Pt states he is in constant dental pain and is is need of medication prior to apt. Pt states he is an occasional smoker. Pt denies fever and emesis.  Past Medical History  Diagnosis Date  . Myocardial infarction     2002; treated with stent to mid L circumflex  . Heart disease   . Hypercholesteremia   . Coronary artery disease   . NSTEMI (non-ST elevated myocardial infarction) 10/18/2011  . CAD (coronary artery disease), native coronary artery 04/18/2008  . Diabetes mellitus     Takes Metformin  . Hypertension     Takes Lisinopril   . GERD (gastroesophageal reflux disease)     Takes Dexilant  . Angina     Takes Isosorbide & Nitroglycerin  . Headache(784.0)   . Pneumonia     approx 2 years ago  . Bronchitis     hx of  . Shortness of breath     Occurs while laying down at times  . History of nuclear stress test 07/29/2011    decreased activity in septum on stress images compared with rest; wall motion abnormality, EF 53%  .  Rhabdomyolysis     h/o, r/t statins  . H/O cardiac catheterization     with 5 separate dated interventions   Past Surgical History  Procedure Laterality Date  . Coronary angioplasty with stent placement  10/09/2001    PTCA & stenting of mid AV circumflex; 2.5x30mm Pixel stent  . Colonoscopy w/ polypectomy    . Lumbar laminectomy/decompression microdiscectomy  03/31/2012    Procedure: LUMBAR LAMINECTOMY/DECOMPRESSION MICRODISCECTOMY 1 LEVEL;  Surgeon: Charlie Pitter, MD;  Location: [facility];  Service: Neurosurgery;  Laterality: Left;  . Transthoracic echocardiogram  07/28/2011    EF 55-65%; LVH, grade 1 diastolic dysfunction;   . Coronary angioplasty with stent placement  12/13/2001    PCI with stent to mid L circumflex, 95% stenosis to 0% residual  . Coronary angioplasty with stent placement  10/10/2003    PCI to mid AV circumflex; LAD 30% disease; RCA 100% occluded prox.  . Coronary angioplasty with stent placement  09/01/2011    PCI with stenting with bare metal stent to mid AV groove circumflex and PDA  . Coronary angioplasty with stent placement  10/17/2011    cutting balloon angioplasty of ostial lateral OM1 branch and bifurcation AV groove circumflex OM junction; stenosis reduced to 0%   Family History  Problem Relation Age of  Onset  . Anesthesia problems Neg Hx   . Hypotension Neg Hx   . Malignant hyperthermia Neg Hx   . Pseudochol deficiency Neg Hx   . Hypertension Mother   . Heart attack Father   . Diabetes Mother   . Heart disease Brother     x 3   . Heart attack Brother     deceased  . Hypertension Sister   . Diabetes Sister    History  Substance Use Topics  . Smoking status: Current Some Day Smoker -- 25 years    Types: Cigarettes  . Smokeless tobacco: Never Used     Comment: 1 pack lasts about 2 weeks  . Alcohol Use: No    Review of Systems  All other systems reviewed and are negative.    Allergies  Iohexol and Vicodin  Home Medications   Current  Outpatient Rx  Name  Route  Sig  Dispense  Refill  . amLODipine (NORVASC) 10 MG tablet   Oral   Take 1 tablet (10 mg total) by mouth daily.   30 tablet   0   . aspirin 81 MG tablet   Oral   Take 81 mg by mouth daily.         . clopidogrel (PLAVIX) 75 MG tablet   Oral   Take 1 tablet (75 mg total) by mouth daily.   30 tablet   6   . cyclobenzaprine (FLEXERIL) 5 MG tablet   Oral   Take 1 tablet (5 mg total) by mouth 3 (three) times daily as needed for muscle spasms.   30 tablet   0   . dexlansoprazole (DEXILANT) 60 MG capsule   Oral   Take 60 mg by mouth daily.         Marland Kitchen ibuprofen (ADVIL,MOTRIN) 200 MG tablet   Oral   Take 200 mg by mouth every 6 (six) hours as needed for pain.         . isosorbide mononitrate (IMDUR) 30 MG 24 hr tablet      TAKE TWO TABLETS BY MOUTH ONCE DAILY AS  DIRECTED. PATIENT NEEDS TO CALL OFFICE TO SCHEDULE AN OPPOINTMENT.   15 tablet   0   . lisinopril-hydrochlorothiazide (PRINZIDE,ZESTORETIC) 20-12.5 MG per tablet   Oral   Take 0.5 tablets by mouth daily.          . meloxicam (MOBIC) 15 MG tablet   Oral   Take 1 tablet (15 mg total) by mouth daily.   15 tablet   0   . metFORMIN (GLUCOPHAGE) 1000 MG tablet   Oral   Take 1,000 mg by mouth 2 (two) times daily with a meal. Restart this medication on Thursday, Oct 21, 2011.         . metoprolol (LOPRESSOR) 50 MG tablet   Oral   Take 1 tablet (50 mg total) by mouth 2 (two) times daily.   60 tablet   10   . nitroGLYCERIN (NITROSTAT) 0.4 MG SL tablet   Sublingual   Place 1 tablet (0.4 mg total) under the tongue every 5 (five) minutes as needed for chest pain.   25 tablet   3   . oxyCODONE-acetaminophen (PERCOCET) 5-325 MG per tablet      1 to 2 tablets every 6 hours as needed for pain.   20 tablet   0   . pravastatin (PRAVACHOL) 40 MG tablet   Oral   Take 40 mg by mouth every morning.          Marland Kitchen  simvastatin (ZOCOR) 20 MG tablet   Oral   Take 20 mg by mouth  daily.         . valsartan-hydrochlorothiazide (DIOVAN-HCT) 320-12.5 MG per tablet   Oral   Take 1 tablet by mouth daily.          Triage Vitals: BP 129/83  Pulse 85  Temp(Src) 97.8 F (36.6 C) (Oral)  Resp 18  Ht 5\' 8"  (1.727 m)  Wt 263 lb (119.296 kg)  BMI 40.00 kg/m2  SpO2 97%  Physical Exam  Nursing note and vitals reviewed. Constitutional: He is oriented to person, place, and time. He appears well-developed and well-nourished. No distress.  HENT:  Head: Normocephalic and atraumatic.  Right Ear: External ear normal.  Left Ear: External ear normal.  Nose: Nose normal.  Mouth/Throat: No trismus in the jaw. Dental abscesses and dental caries present. No uvula swelling. Posterior oropharyngeal erythema present.    Multiple dental caries with dentin exposed  Eyes: Conjunctivae are normal. Pupils are equal, round, and reactive to light. No scleral icterus.  Neck: Normal range of motion. Neck supple.  Cardiovascular: Normal rate, regular rhythm and normal heart sounds.  Exam reveals no gallop and no friction rub.   No murmur heard. Pulmonary/Chest: Effort normal and breath sounds normal. No respiratory distress. He has no wheezes. He has no rales. He exhibits no tenderness.  Musculoskeletal: Normal range of motion. He exhibits no edema and no tenderness.  Lymphadenopathy:    He has no cervical adenopathy.  Neurological: He is alert and oriented to person, place, and time. He exhibits normal muscle tone. Coordination normal.  Skin: Skin is warm and dry. No rash noted. No erythema. No pallor.  Psychiatric: He has a normal mood and affect. His behavior is normal. Judgment and thought content normal.    ED Course  Procedures  DIAGNOSTIC STUDIES: Oxygen Saturation is 97% on RA, normal by my interpretation.    COORDINATION OF CARE: 5:14 PM-Discussed treatment plan which includes discharge pt with antibiotics and pain medication. Pt agreed to plan.   Labs Review Labs  Reviewed - No data to display Imaging Review No results found.  EKG Interpretation   None      MDM  Dental caries with abscess Pharyngitis  Patient here with cough, congestion and posterior pharyngeal erythema, likely viral.  Also with multiple dental caries, no sign of ludwig's angina, PTA or other soft tissue or facial abscess noted.  Will start on antibiotics.   I personally performed the services described in this documentation, which was scribed in my presence. The recorded information has been reviewed and is accurate.    Idalia Needle Joelyn Oms, PA-C 12/21/13 1725

## 2013-12-22 NOTE — ED Provider Notes (Signed)
Medical screening examination/treatment/procedure(s) were performed by non-physician practitioner and as supervising physician I was immediately available for consultation/collaboration.  EKG Interpretation   None        Jasper Riling. Alvino Chapel, [place] 12/22/13 [phone]

## 2013-12-28 ENCOUNTER — Ambulatory Visit: Payer: Self-pay | Admitting: Podiatrist

## 2013-12-31 ENCOUNTER — Encounter (HOSPITAL_COMMUNITY): Payer: Self-pay | Admitting: Emergency Medicine

## 2013-12-31 ENCOUNTER — Emergency Department (HOSPITAL_COMMUNITY)
Admission: EM | Admit: 2013-12-31 | Discharge: 2013-12-31 | Disposition: A | Discharge status: Home / Self Care | Payer: Worker's Compensation | Attending: Emergency Medicine | Admitting: Emergency Medicine

## 2013-12-31 DIAGNOSIS — I2510 Atherosclerotic heart disease of native coronary artery without angina pectoris: Secondary | ICD-10-CM | POA: Insufficient documentation

## 2013-12-31 DIAGNOSIS — Z8709 Personal history of other diseases of the respiratory system: Secondary | ICD-10-CM | POA: Insufficient documentation

## 2013-12-31 DIAGNOSIS — Z7902 Long term (current) use of antithrombotics/antiplatelets: Secondary | ICD-10-CM | POA: Insufficient documentation

## 2013-12-31 DIAGNOSIS — Z8739 Personal history of other diseases of the musculoskeletal system and connective tissue: Secondary | ICD-10-CM | POA: Insufficient documentation

## 2013-12-31 DIAGNOSIS — K219 Gastro-esophageal reflux disease without esophagitis: Secondary | ICD-10-CM | POA: Insufficient documentation

## 2013-12-31 DIAGNOSIS — Y929 Unspecified place or not applicable: Secondary | ICD-10-CM | POA: Insufficient documentation

## 2013-12-31 DIAGNOSIS — Z9861 Coronary angioplasty status: Secondary | ICD-10-CM | POA: Insufficient documentation

## 2013-12-31 DIAGNOSIS — I252 Old myocardial infarction: Secondary | ICD-10-CM | POA: Insufficient documentation

## 2013-12-31 DIAGNOSIS — Z7982 Long term (current) use of aspirin: Secondary | ICD-10-CM | POA: Insufficient documentation

## 2013-12-31 DIAGNOSIS — Z792 Long term (current) use of antibiotics: Secondary | ICD-10-CM | POA: Insufficient documentation

## 2013-12-31 DIAGNOSIS — Z77098 Contact with and (suspected) exposure to other hazardous, chiefly nonmedicinal, chemicals: Secondary | ICD-10-CM

## 2013-12-31 DIAGNOSIS — F17200 Nicotine dependence, unspecified, uncomplicated: Secondary | ICD-10-CM | POA: Insufficient documentation

## 2013-12-31 DIAGNOSIS — Z8701 Personal history of pneumonia (recurrent): Secondary | ICD-10-CM | POA: Insufficient documentation

## 2013-12-31 DIAGNOSIS — Z9889 Other specified postprocedural states: Secondary | ICD-10-CM | POA: Insufficient documentation

## 2013-12-31 DIAGNOSIS — Z79899 Other long term (current) drug therapy: Secondary | ICD-10-CM | POA: Insufficient documentation

## 2013-12-31 DIAGNOSIS — I209 Angina pectoris, unspecified: Secondary | ICD-10-CM | POA: Insufficient documentation

## 2013-12-31 DIAGNOSIS — I10 Essential (primary) hypertension: Secondary | ICD-10-CM | POA: Insufficient documentation

## 2013-12-31 DIAGNOSIS — E7800 Pure hypercholesterolemia, unspecified: Secondary | ICD-10-CM | POA: Insufficient documentation

## 2013-12-31 DIAGNOSIS — Y9389 Activity, other specified: Secondary | ICD-10-CM | POA: Insufficient documentation

## 2013-12-31 DIAGNOSIS — H16009 Unspecified corneal ulcer, unspecified eye: Secondary | ICD-10-CM

## 2013-12-31 DIAGNOSIS — T1590XA Foreign body on external eye, part unspecified, unspecified eye, initial encounter: Secondary | ICD-10-CM | POA: Insufficient documentation

## 2013-12-31 DIAGNOSIS — E119 Type 2 diabetes mellitus without complications: Secondary | ICD-10-CM | POA: Insufficient documentation

## 2013-12-31 MED ORDER — KETOROLAC TROMETHAMINE 0.5 % OP SOLN
1.0000 [drp] | Freq: Four times a day (QID) | OPHTHALMIC | Status: DC
  Administered 2013-12-31: 1 [drp] via OPHTHALMIC
  Filled 2013-12-31: qty 3

## 2013-12-31 MED ORDER — FLUORESCEIN SODIUM 1 MG OP STRP
1.0000 | ORAL_STRIP | Freq: Once | OPHTHALMIC | Status: AC
  Administered 2013-12-31: 1 via OPHTHALMIC
  Filled 2013-12-31: qty 1

## 2013-12-31 MED ORDER — MOXIFLOXACIN HCL 0.5 % OP SOLN: 1.0000 [drp] | Freq: Four times a day (QID) | OPHTHALMIC | Status: DC

## 2013-12-31 MED ORDER — TETRACAINE HCL 0.5 % OP SOLN
1.0000 [drp] | Freq: Once | OPHTHALMIC | Status: AC
  Administered 2013-12-31: 1 [drp] via OPHTHALMIC
  Filled 2013-12-31: qty 2

## 2013-12-31 MED ORDER — IBUPROFEN 800 MG PO TABS
800.0000 mg | ORAL_TABLET | Freq: Once | ORAL | Status: AC
  Administered 2013-12-31: 800 mg via ORAL
  Filled 2013-12-31: qty 2

## 2013-12-31 NOTE — ED Notes (Signed)
MD at bedside.PA at bedside

## 2013-12-31 NOTE — ED Provider Notes (Addendum)
Medical screening examination/treatment/procedure(s) were conducted as a shared visit with non-physician practitioner(s) and myself.  I personally evaluated the patient during the encounter.  EKG Interpretation   None       PT with bleach exposure to R eye, pH is normal, corneal ulcer. Ophtho followup.   Remi Rester B. Karle Starch, MD 12/31/13 1535

## 2013-12-31 NOTE — ED Notes (Signed)
Pt states he was putting up supplies and bottle of bleach exposed in his face; bleach splashed in right eye

## 2013-12-31 NOTE — Discharge Instructions (Signed)
Take the prescribed medication as directed. Follow-up with opthalmology in 1 week or sooner if problems occur (worsening vision, sudden loss of vision, drainage from eye, etc.) Return to the ED for new or worsening symptoms.

## 2013-12-31 NOTE — ED Provider Notes (Signed)
CSN: [id number]     Arrival date & time 12/31/13  0627 History   First MD Initiated Contact with Patient 12/31/13 0654     Chief Complaint  Patient presents with  . Eye Injury   (Consider location/radiation/quality/duration/timing/severity/associated sxs/prior Treatment) Patient is a 48 y.o. male presenting with eye injury. The history is provided by the patient and medical records.  Eye Injury  This is a 48 y.o. M with PMH significant for HTN, DM, CAD, prior MI s/p CABG, presenting to the ED for chemical exposure to eye.  Pt states he was moving a container of bleach from a shelf earlier this morning when it exploded and some bleach was splashed into his right eye.  Denies any exposure in left eye.  Irrigated eye at work with water with some relief.  States right eye is burning and vision feels mildly blurry now.  Does not wear glasses or contacts.  The patient is not currently establish with an ophthalmologist.  Past Medical History  Diagnosis Date  . Myocardial infarction     2002; treated with stent to mid L circumflex  . Heart disease   . Hypercholesteremia   . Coronary artery disease   . NSTEMI (non-ST elevated myocardial infarction) 10/18/2011  . CAD (coronary artery disease), native coronary artery 04/18/2008  . Diabetes mellitus     Takes Metformin  . Hypertension     Takes Lisinopril   . GERD (gastroesophageal reflux disease)     Takes Dexilant  . Angina     Takes Isosorbide & Nitroglycerin  . Headache(784.0)   . Pneumonia     approx 2 years ago  . Bronchitis     hx of  . Shortness of breath     Occurs while laying down at times  . History of nuclear stress test 07/29/2011    decreased activity in septum on stress images compared with rest; wall motion abnormality, EF 53%  . Rhabdomyolysis     h/o, r/t statins  . H/O cardiac catheterization     with 5 separate dated interventions   Past Surgical History  Procedure Laterality Date  . Coronary angioplasty with stent  placement  10/09/2001    PTCA & stenting of mid AV circumflex; 2.5x63mm Pixel stent  . Colonoscopy w/ polypectomy    . Lumbar laminectomy/decompression microdiscectomy  03/31/2012    Procedure: LUMBAR LAMINECTOMY/DECOMPRESSION MICRODISCECTOMY 1 LEVEL;  Surgeon: Charlie Pitter, MD;  Location: [facility];  Service: Neurosurgery;  Laterality: Left;  . Transthoracic echocardiogram  07/28/2011    EF 55-65%; LVH, grade 1 diastolic dysfunction;   . Coronary angioplasty with stent placement  12/13/2001    PCI with stent to mid L circumflex, 95% stenosis to 0% residual  . Coronary angioplasty with stent placement  10/10/2003    PCI to mid AV circumflex; LAD 30% disease; RCA 100% occluded prox.  . Coronary angioplasty with stent placement  09/01/2011    PCI with stenting with bare metal stent to mid AV groove circumflex and PDA  . Coronary angioplasty with stent placement  10/17/2011    cutting balloon angioplasty of ostial lateral OM1 branch and bifurcation AV groove circumflex OM junction; stenosis reduced to 0%   Family History  Problem Relation Age of Onset  . Anesthesia problems Neg Hx   . Hypotension Neg Hx   . Malignant hyperthermia Neg Hx   . Pseudochol deficiency Neg Hx   . Hypertension Mother   . Heart attack Father   .  Diabetes Mother   . Heart disease Brother     x 3   . Heart attack Brother     deceased  . Hypertension Sister   . Diabetes Sister    History  Substance Use Topics  . Smoking status: Current Some Day Smoker -- 25 years    Types: Cigarettes  . Smokeless tobacco: Never Used     Comment: 1 pack lasts about 2 weeks  . Alcohol Use: No    Review of Systems  Eyes: Positive for pain and redness.  All other systems reviewed and are negative.    Allergies  Iohexol and Vicodin  Home Medications   Current Outpatient Rx  Name  Route  Sig  Dispense  Refill  . amLODipine (NORVASC) 10 MG tablet   Oral   Take 1 tablet (10 mg total) by mouth daily.   30 tablet    0   . amoxicillin (AMOXIL) 500 MG capsule   Oral   Take 1 capsule (500 mg total) by mouth 3 (three) times daily.   21 capsule   0   . aspirin 81 MG tablet   Oral   Take 81 mg by mouth daily.         . clopidogrel (PLAVIX) 75 MG tablet   Oral   Take 1 tablet (75 mg total) by mouth daily.   30 tablet   6   . cyclobenzaprine (FLEXERIL) 5 MG tablet   Oral   Take 1 tablet (5 mg total) by mouth 3 (three) times daily as needed for muscle spasms.   30 tablet   0   . dexlansoprazole (DEXILANT) 60 MG capsule   Oral   Take 60 mg by mouth daily.         Marland Kitchen HYDROcodone-acetaminophen (NORCO/VICODIN) 5-325 MG per tablet   Oral   Take 1 tablet by mouth every 4 (four) hours as needed.   20 tablet   0   . ibuprofen (ADVIL,MOTRIN) 200 MG tablet   Oral   Take 200 mg by mouth every 6 (six) hours as needed for pain.         . isosorbide mononitrate (IMDUR) 30 MG 24 hr tablet      TAKE TWO TABLETS BY MOUTH ONCE DAILY AS  DIRECTED. PATIENT NEEDS TO CALL OFFICE TO SCHEDULE AN OPPOINTMENT.   15 tablet   0   . lisinopril-hydrochlorothiazide (PRINZIDE,ZESTORETIC) 20-12.5 MG per tablet   Oral   Take 0.5 tablets by mouth daily.          . meloxicam (MOBIC) 15 MG tablet   Oral   Take 1 tablet (15 mg total) by mouth daily.   15 tablet   0   . metFORMIN (GLUCOPHAGE) 1000 MG tablet   Oral   Take 1,000 mg by mouth 2 (two) times daily with a meal. Restart this medication on Thursday, Oct 21, 2011.         . metoprolol (LOPRESSOR) 50 MG tablet   Oral   Take 1 tablet (50 mg total) by mouth 2 (two) times daily.   60 tablet   10   . nitroGLYCERIN (NITROSTAT) 0.4 MG SL tablet   Sublingual   Place 1 tablet (0.4 mg total) under the tongue every 5 (five) minutes as needed for chest pain.   25 tablet   3   . oxyCODONE-acetaminophen (PERCOCET) 5-325 MG per tablet      1 to 2 tablets every 6 hours as needed for pain.  20 tablet   0   . pravastatin (PRAVACHOL) 40 MG tablet    Oral   Take 40 mg by mouth every morning.          . simvastatin (ZOCOR) 20 MG tablet   Oral   Take 20 mg by mouth daily.         . valsartan-hydrochlorothiazide (DIOVAN-HCT) 320-12.5 MG per tablet   Oral   Take 1 tablet by mouth daily.          BP 144/91  Pulse 97  Temp(Src) 98.6 F (37 C) (Oral)  Resp 16  SpO2 97%  Physical Exam  Nursing note and vitals reviewed. Constitutional: He is oriented to person, place, and time. He appears well-developed and well-nourished. No distress.  HENT:  Head: Normocephalic and atraumatic.  Mouth/Throat: Oropharynx is clear and moist.  Eyes: EOM and lids are normal. Pupils are equal, round, and reactive to light. No foreign body present in the right eye. Right conjunctiva is injected. Left conjunctiva is not injected. Right eye exhibits normal extraocular motion and no nystagmus.  Slit lamp exam:      The right eye shows corneal ulcer. The right eye shows no foreign body and no fluorescein uptake.  Right conjunctiva injected, eye continuously tearing; pH 7; small corneal ulcer of fluorescein stain Left eye WNL  Neck: Normal range of motion. Neck supple.  Cardiovascular: Normal rate, regular rhythm and normal heart sounds.   Pulmonary/Chest: Effort normal and breath sounds normal. No respiratory distress. He has no wheezes.  Musculoskeletal: Normal range of motion.  Neurological: He is alert and oriented to person, place, and time.  Skin: Skin is warm and dry. He is not diaphoretic.  Psychiatric: He has a normal mood and affect.    ED Course  Procedures (including critical care time) Labs Review Labs Reviewed - No data to display Imaging Review No results found.  EKG Interpretation   None       MDM   1. Chemical exposure of eye   2. Corneal ulcer    Right eye injected with tearing, normal pH of 7.  Will irrigate and fluorescein stain.  Visual acuity R 20/200, L 20/50.  Fluorescein stain revealing small corneal ulcer of  right eye.  Spoke with Dr. Baird Cancer from opthalmology-- will see pt in FU in 1 week or sooner if problems occur..  Advised to start on vigamox, acular, and artificial tears.  Discussed plan with pt and wife, they acknowledged understanding and agreed.  Return precautions advised for new or worsening sx.  Larene Pickett, PA-C 12/31/13 1013

## 2014-01-15 ENCOUNTER — Other Ambulatory Visit: Payer: Self-pay | Admitting: Internal Medicine

## 2014-01-16 NOTE — Telephone Encounter (Signed)
Rx was sent to pharmacy electronically. 

## 2014-01-23 ENCOUNTER — Emergency Department (HOSPITAL_COMMUNITY): Payer: BC Managed Care – PPO

## 2014-01-23 ENCOUNTER — Emergency Department (HOSPITAL_COMMUNITY)
Admission: EM | Admit: 2014-01-23 | Discharge: 2014-01-24 | Disposition: A | Discharge status: Home / Self Care | Payer: BC Managed Care – PPO | Attending: Emergency Medicine | Admitting: Emergency Medicine

## 2014-01-23 ENCOUNTER — Encounter (HOSPITAL_COMMUNITY): Payer: Self-pay | Admitting: Emergency Medicine

## 2014-01-23 DIAGNOSIS — M545 Low back pain, unspecified: Secondary | ICD-10-CM

## 2014-01-23 DIAGNOSIS — Z8709 Personal history of other diseases of the respiratory system: Secondary | ICD-10-CM | POA: Insufficient documentation

## 2014-01-23 DIAGNOSIS — I209 Angina pectoris, unspecified: Secondary | ICD-10-CM | POA: Insufficient documentation

## 2014-01-23 DIAGNOSIS — S335XXA Sprain of ligaments of lumbar spine, initial encounter: Secondary | ICD-10-CM | POA: Insufficient documentation

## 2014-01-23 DIAGNOSIS — Y990 Civilian activity done for income or pay: Secondary | ICD-10-CM | POA: Insufficient documentation

## 2014-01-23 DIAGNOSIS — F17200 Nicotine dependence, unspecified, uncomplicated: Secondary | ICD-10-CM | POA: Insufficient documentation

## 2014-01-23 DIAGNOSIS — Z951 Presence of aortocoronary bypass graft: Secondary | ICD-10-CM | POA: Insufficient documentation

## 2014-01-23 DIAGNOSIS — Z9861 Coronary angioplasty status: Secondary | ICD-10-CM | POA: Insufficient documentation

## 2014-01-23 DIAGNOSIS — Z791 Long term (current) use of non-steroidal anti-inflammatories (NSAID): Secondary | ICD-10-CM | POA: Insufficient documentation

## 2014-01-23 DIAGNOSIS — Z79899 Other long term (current) drug therapy: Secondary | ICD-10-CM | POA: Insufficient documentation

## 2014-01-23 DIAGNOSIS — Z7982 Long term (current) use of aspirin: Secondary | ICD-10-CM | POA: Insufficient documentation

## 2014-01-23 DIAGNOSIS — K219 Gastro-esophageal reflux disease without esophagitis: Secondary | ICD-10-CM | POA: Insufficient documentation

## 2014-01-23 DIAGNOSIS — Z7902 Long term (current) use of antithrombotics/antiplatelets: Secondary | ICD-10-CM | POA: Insufficient documentation

## 2014-01-23 DIAGNOSIS — I2510 Atherosclerotic heart disease of native coronary artery without angina pectoris: Secondary | ICD-10-CM | POA: Insufficient documentation

## 2014-01-23 DIAGNOSIS — Y9289 Other specified places as the place of occurrence of the external cause: Secondary | ICD-10-CM | POA: Insufficient documentation

## 2014-01-23 DIAGNOSIS — Z8701 Personal history of pneumonia (recurrent): Secondary | ICD-10-CM | POA: Insufficient documentation

## 2014-01-23 DIAGNOSIS — Z87828 Personal history of other (healed) physical injury and trauma: Secondary | ICD-10-CM | POA: Insufficient documentation

## 2014-01-23 DIAGNOSIS — Y9389 Activity, other specified: Secondary | ICD-10-CM | POA: Insufficient documentation

## 2014-01-23 DIAGNOSIS — E7800 Pure hypercholesterolemia, unspecified: Secondary | ICD-10-CM | POA: Insufficient documentation

## 2014-01-23 DIAGNOSIS — X500XXA Overexertion from strenuous movement or load, initial encounter: Secondary | ICD-10-CM | POA: Insufficient documentation

## 2014-01-23 DIAGNOSIS — S39012A Strain of muscle, fascia and tendon of lower back, initial encounter: Secondary | ICD-10-CM

## 2014-01-23 DIAGNOSIS — Z9889 Other specified postprocedural states: Secondary | ICD-10-CM | POA: Insufficient documentation

## 2014-01-23 DIAGNOSIS — I10 Essential (primary) hypertension: Secondary | ICD-10-CM | POA: Insufficient documentation

## 2014-01-23 DIAGNOSIS — E119 Type 2 diabetes mellitus without complications: Secondary | ICD-10-CM | POA: Insufficient documentation

## 2014-01-23 DIAGNOSIS — Z792 Long term (current) use of antibiotics: Secondary | ICD-10-CM | POA: Insufficient documentation

## 2014-01-23 DIAGNOSIS — I252 Old myocardial infarction: Secondary | ICD-10-CM | POA: Insufficient documentation

## 2014-01-23 LAB — URINALYSIS, ROUTINE W REFLEX MICROSCOPIC
Glucose, UA: NEGATIVE mg/dL
Hgb urine dipstick: NEGATIVE
Ketones, ur: NEGATIVE mg/dL
LEUKOCYTES UA: NEGATIVE
NITRITE: NEGATIVE
PH: 5.5 (ref 5.0–8.0)
Protein, ur: NEGATIVE mg/dL
SPECIFIC GRAVITY, URINE: 1.032 — AB (ref 1.005–1.030)
UROBILINOGEN UA: 1 mg/dL (ref 0.0–1.0)

## 2014-01-23 LAB — CBC
HCT: 42.9 % (ref 39.0–52.0)
Hemoglobin: 14.6 g/dL (ref 13.0–17.0)
MCH: 27.3 pg (ref 26.0–34.0)
MCHC: 34 g/dL (ref 30.0–36.0)
MCV: 80.3 fL (ref 78.0–100.0)
PLATELETS: 233 10*3/uL (ref 150–400)
RBC: 5.34 MIL/uL (ref 4.22–5.81)
RDW: 13.6 % (ref 11.5–15.5)
WBC: 8.7 10*3/uL (ref 4.0–10.5)

## 2014-01-23 LAB — BASIC METABOLIC PANEL
BUN: 10 mg/dL (ref 6–23)
CHLORIDE: 103 meq/L (ref 96–112)
CO2: 27 mEq/L (ref 19–32)
Calcium: 8.9 mg/dL (ref 8.4–10.5)
Creatinine, Ser: 1.01 mg/dL (ref 0.50–1.35)
GFR calc Af Amer: >90 mL/min (ref >90)
GFR calc non Af Amer: 87 mL/min — ABNORMAL LOW (ref >90)
Glucose, Bld: 151 mg/dL — ABNORMAL HIGH (ref 70–99)
Potassium: 3.7 mEq/L (ref 3.7–5.3)
SODIUM: 143 meq/L (ref 137–147)

## 2014-01-23 LAB — I-STAT TROPONIN, ED: TROPONIN I, POC: 0 ng/mL (ref 0.00–0.08)

## 2014-01-23 MED ORDER — OXYCODONE-ACETAMINOPHEN 5-325 MG PO TABS
2.0000 | ORAL_TABLET | Freq: Once | ORAL | Status: AC
  Administered 2014-01-23: 2 via ORAL
  Filled 2014-01-23: qty 2

## 2014-01-23 MED ORDER — METHOCARBAMOL 500 MG PO TABS: 500.0000 mg | ORAL_TABLET | Freq: Two times a day (BID) | ORAL | Status: DC

## 2014-01-23 MED ORDER — METHOCARBAMOL 500 MG PO TABS
750.0000 mg | ORAL_TABLET | Freq: Once | ORAL | Status: AC
  Administered 2014-01-23: 750 mg via ORAL
  Filled 2014-01-23: qty 2

## 2014-01-23 MED ORDER — OXYCODONE-ACETAMINOPHEN 5-325 MG PO TABS: 1.0000 | ORAL_TABLET | ORAL | Status: DC | PRN

## 2014-01-23 NOTE — Discharge Instructions (Signed)
1. Medications: robaxin, percocet, usual home medications 2. Treatment: rest, drink plenty of fluids, gentle stretching as discussed, alternate ice and heat 3. Follow Up: Please followup with your primary doctor for discussion of your diagnoses and further evaluation after today's visit; if you do not have a primary care doctor use the resource guide provided to find one;   Back Exercises Back exercises help treat and prevent back injuries. The goal of back exercises is to increase the strength of your abdominal and back muscles and the flexibility of your back. These exercises should be started when you no longer have back pain. Back exercises include:  Pelvic Tilt. Lie on your back with your knees bent. Tilt your pelvis until the lower part of your back is against the floor. Hold this position 5 to 10 sec and repeat 5 to 10 times.  Knee to Chest. Pull first 1 knee up against your chest and hold for 20 to 30 seconds, repeat this with the other knee, and then both knees. This may be done with the other leg straight or bent, whichever feels better.  Sit-Ups or Curl-Ups. Bend your knees 90 degrees. Start with tilting your pelvis, and do a partial, slow sit-up, lifting your trunk only 30 to 45 degrees off the floor. Take at least 2 to 3 seconds for each sit-up. Do not do sit-ups with your knees out straight. If partial sit-ups are difficult, simply do the above but with only tightening your abdominal muscles and holding it as directed.  Hip-Lift. Lie on your back with your knees flexed 90 degrees. Push down with your feet and shoulders as you raise your hips a couple inches off the floor; hold for 10 seconds, repeat 5 to 10 times.  Back arches. Lie on your stomach, propping yourself up on bent elbows. Slowly press on your hands, causing an arch in your low back. Repeat 3 to 5 times. Any initial stiffness and discomfort should lessen with repetition over time.  Shoulder-Lifts. Lie face down with arms  beside your body. Keep hips and torso pressed to floor as you slowly lift your head and shoulders off the floor. Do not overdo your exercises, especially in the beginning. Exercises may cause you some mild back discomfort which lasts for a few minutes; however, if the pain is more severe, or lasts for more than 15 minutes, do not continue exercises until you see your caregiver. Improvement with exercise therapy for back problems is slow.  See your caregivers for assistance with developing a proper back exercise program. Document Released: 12/16/2004 Document Revised: 01/31/2012 Document Reviewed: 09/09/2011 [facility] Patient Information 2014 [place].

## 2014-01-23 NOTE — ED Notes (Addendum)
Pt complaining of right mid lower back pain with palpation and right flank/rib pain . sts hurts when he takes a deep breath. sts started throbbing after moving some tables on Monday.

## 2014-01-23 NOTE — ED Provider Notes (Signed)
CSN: [id number]     Arrival date & time 01/23/14  1833 History   First MD Initiated Contact with Patient 01/23/14 2241     Chief Complaint  Patient presents with  . Back Pain     (Consider location/radiation/quality/duration/timing/severity/associated sxs/prior Treatment) Patient is a 48 y.o. male presenting with back pain. The history is provided by the patient and medical records. No language interpreter was used.  Back Pain Associated symptoms: no abdominal pain, no chest pain, no dysuria, no fever and no headaches     Andrew West is a 48 y.o. male  with a hx of HTN, DM, CAD, prior MI s/p CABG presents to the Emergency Department complaining of gradual, persistent, progressively worsening right lower back pain onset 2 days ago.  Patient reports that Monday morning he was moving large tables at his work. He reports that 2 hours later he began to have low back pain. That night he took Percocet which alleviated his back pain. He reports the pain continued the next day and he attempted Tylenol which helped but did not alleviate his back pain.  He reports moving and bending worsened his back pain is lying still improves it. He denies saddle anesthesia, numbness, tingling, weakness, gait disturbance, loss of bowel or bladder control. He does report a history of low back surgery but denies known trauma.  Patient denies fever, chills, headache, neck pain, chest pain, shortness of breath, abdominal pain, nausea, vomiting, diarrhea, weakness, numbness, tingling, dysuria, hematuria, rash.  Patient also endorses a dry cough for the last 24 hours. He reports this also makes his back hurt worse.   Past Medical History  Diagnosis Date  . Myocardial infarction     2002; treated with stent to mid L circumflex  . Heart disease   . Hypercholesteremia   . Coronary artery disease   . NSTEMI (non-ST elevated myocardial infarction) 10/18/2011  . CAD (coronary artery disease), native coronary artery  04/18/2008  . Diabetes mellitus     Takes Metformin  . Hypertension     Takes Lisinopril   . GERD (gastroesophageal reflux disease)     Takes Dexilant  . Angina     Takes Isosorbide & Nitroglycerin  . Headache(784.0)   . Pneumonia     approx 2 years ago  . Bronchitis     hx of  . Shortness of breath     Occurs while laying down at times  . History of nuclear stress test 07/29/2011    decreased activity in septum on stress images compared with rest; wall motion abnormality, EF 53%  . Rhabdomyolysis     h/o, r/t statins  . H/O cardiac catheterization     with 5 separate dated interventions   Past Surgical History  Procedure Laterality Date  . Coronary angioplasty with stent placement  10/09/2001    PTCA & stenting of mid AV circumflex; 2.5x42mm Pixel stent  . Colonoscopy w/ polypectomy    . Lumbar laminectomy/decompression microdiscectomy  03/31/2012    Procedure: LUMBAR LAMINECTOMY/DECOMPRESSION MICRODISCECTOMY 1 LEVEL;  Surgeon: Charlie Pitter, MD;  Location: [facility];  Service: Neurosurgery;  Laterality: Left;  . Transthoracic echocardiogram  07/28/2011    EF 55-65%; LVH, grade 1 diastolic dysfunction;   . Coronary angioplasty with stent placement  12/13/2001    PCI with stent to mid L circumflex, 95% stenosis to 0% residual  . Coronary angioplasty with stent placement  10/10/2003    PCI to mid AV circumflex; LAD 30% disease;  RCA 100% occluded prox.  . Coronary angioplasty with stent placement  09/01/2011    PCI with stenting with bare metal stent to mid AV groove circumflex and PDA  . Coronary angioplasty with stent placement  10/17/2011    cutting balloon angioplasty of ostial lateral OM1 branch and bifurcation AV groove circumflex OM junction; stenosis reduced to 0%   Family History  Problem Relation Age of Onset  . Anesthesia problems Neg Hx   . Hypotension Neg Hx   . Malignant hyperthermia Neg Hx   . Pseudochol deficiency Neg Hx   . Hypertension Mother   . Heart  attack Father   . Diabetes Mother   . Heart disease Brother     x 3   . Heart attack Brother     deceased  . Hypertension Sister   . Diabetes Sister    History  Substance Use Topics  . Smoking status: Current Some Day Smoker -- 25 years    Types: Cigarettes  . Smokeless tobacco: Never Used     Comment: 1 pack lasts about 2 weeks  . Alcohol Use: No    Review of Systems  Constitutional: Negative for fever, diaphoresis, appetite change, fatigue and unexpected weight change.  HENT: Negative for mouth sores.   Eyes: Negative for visual disturbance.  Respiratory: Positive for cough. Negative for chest tightness, shortness of breath and wheezing.   Cardiovascular: Negative for chest pain.  Gastrointestinal: Negative for nausea, vomiting, abdominal pain, diarrhea and constipation.  Endocrine: Negative for polydipsia, polyphagia and polyuria.  Genitourinary: Negative for dysuria, urgency, frequency and hematuria.  Musculoskeletal: Positive for back pain. Negative for gait problem, joint swelling, neck pain and neck stiffness.  Skin: Negative for rash.  Allergic/Immunologic: Negative for immunocompromised state.  Neurological: Negative for syncope, light-headedness and headaches.  Hematological: Does not bruise/bleed easily.  Psychiatric/Behavioral: Negative for sleep disturbance. The patient is not nervous/anxious.   All other systems reviewed and are negative.      Allergies  Iohexol and Vicodin  Home Medications   Current Outpatient Rx  Name  Route  Sig  Dispense  Refill  . amLODipine (NORVASC) 10 MG tablet   Oral   Take 1 tablet (10 mg total) by mouth daily.   30 tablet   0   . aspirin 81 MG tablet   Oral   Take 81 mg by mouth daily.         . clopidogrel (PLAVIX) 75 MG tablet      TAKE ONE TABLET BY MOUTH ONCE DAILY   30 tablet   6   . dexlansoprazole (DEXILANT) 60 MG capsule   Oral   Take 60 mg by mouth daily.         Marland Kitchen HYDROcodone-acetaminophen  (NORCO/VICODIN) 5-325 MG per tablet   Oral   Take 1 tablet by mouth every 4 (four) hours as needed.   20 tablet   0   . isosorbide mononitrate (IMDUR) 30 MG 24 hr tablet      TAKE TWO TABLETS BY MOUTH ONCE DAILY AS  DIRECTED. PATIENT NEEDS TO CALL OFFICE TO SCHEDULE AN OPPOINTMENT.   15 tablet   0   . lisinopril-hydrochlorothiazide (PRINZIDE,ZESTORETIC) 20-12.5 MG per tablet   Oral   Take 0.5 tablets by mouth daily.          . meloxicam (MOBIC) 15 MG tablet   Oral   Take 1 tablet (15 mg total) by mouth daily.   15 tablet   0   .  metFORMIN (GLUCOPHAGE) 1000 MG tablet   Oral   Take 1,000 mg by mouth 2 (two) times daily with a meal. Restart this medication on Thursday, Oct 21, 2011.         . metoprolol (LOPRESSOR) 50 MG tablet   Oral   Take 1 tablet (50 mg total) by mouth 2 (two) times daily.   60 tablet   10   . moxifloxacin (VIGAMOX) 0.5 % ophthalmic solution   Ophthalmic   Apply 1 drop to eye 4 (four) times daily. For 1 week   3 mL   0   . oxyCODONE-acetaminophen (PERCOCET) 5-325 MG per tablet      1 to 2 tablets every 6 hours as needed for pain.   20 tablet   0   . pravastatin (PRAVACHOL) 40 MG tablet   Oral   Take 40 mg by mouth every morning.          . simvastatin (ZOCOR) 20 MG tablet   Oral   Take 20 mg by mouth daily.         . valsartan-hydrochlorothiazide (DIOVAN-HCT) 320-12.5 MG per tablet   Oral   Take 1 tablet by mouth daily.         . methocarbamol (ROBAXIN) 500 MG tablet   Oral   Take 1 tablet (500 mg total) by mouth 2 (two) times daily.   20 tablet   0   . nitroGLYCERIN (NITROSTAT) 0.4 MG SL tablet   Sublingual   Place 1 tablet (0.4 mg total) under the tongue every 5 (five) minutes as needed for chest pain.   25 tablet   3   . oxyCODONE-acetaminophen (PERCOCET/ROXICET) 5-325 MG per tablet   Oral   Take 1-2 tablets by mouth every 4 (four) hours as needed for severe pain.   10 tablet   0    BP 125/73  Pulse 80   Temp(Src) 98.5 F (36.9 C) (Oral)  Resp 26  Ht 5\' 8"  (1.727 m)  Wt 267 lb 8 oz (121.337 kg)  BMI 40.68 kg/m2  SpO2 94% Physical Exam  Nursing note and vitals reviewed. Constitutional: He is oriented to person, place, and time. He appears well-developed and well-nourished. No distress.  Awake, alert, nontoxic appearance  HENT:  Head: Normocephalic and atraumatic.  Right Ear: Tympanic membrane, external ear and ear canal normal.  Left Ear: Tympanic membrane, external ear and ear canal normal.  Nose: Nose normal. No epistaxis. Right sinus exhibits no maxillary sinus tenderness and no frontal sinus tenderness. Left sinus exhibits no maxillary sinus tenderness and no frontal sinus tenderness.  Mouth/Throat: Uvula is midline, oropharynx is clear and moist and mucous membranes are normal. Mucous membranes are not pale and not cyanotic. No oropharyngeal exudate, posterior oropharyngeal edema, posterior oropharyngeal erythema or tonsillar abscesses.  Eyes: Conjunctivae are normal. Pupils are equal, round, and reactive to light. No scleral icterus.  Neck: Normal range of motion and full passive range of motion without pain. Neck supple. No spinous process tenderness and no muscular tenderness present. No rigidity. Normal range of motion present.  Full ROM without pain No midline or paraspinal tenderness  Cardiovascular: Normal rate, regular rhythm, normal heart sounds and intact distal pulses.   No murmur heard. Pulmonary/Chest: Effort normal and breath sounds normal. No stridor. No respiratory distress. He has no wheezes. He has no rales.  Abdominal: Soft. Bowel sounds are normal. He exhibits no distension and no mass. There is no tenderness. There is no rebound and no  guarding.  Musculoskeletal: Normal range of motion. He exhibits no edema.       Thoracic back: He exhibits tenderness and pain. He exhibits normal range of motion, no swelling, no edema, no deformity, no laceration, no spasm and  normal pulse.       Lumbar back: He exhibits tenderness, pain and spasm. He exhibits normal range of motion, no bony tenderness, no swelling, no edema, no deformity, no laceration and normal pulse.       Back:  Full range of motion of the T-spine and L-spine No tenderness to palpation of the spinous processes of the T-spine or L-spine Mild tenderness to palpation of the right paraspinous muscles of the L-spine Well healed surgical incision over the L-spine  Lymphadenopathy:    He has no cervical adenopathy.  Neurological: He is alert and oriented to person, place, and time. He has normal strength and normal reflexes. No sensory deficit. He exhibits normal muscle tone. GCS eye subscore is 4. GCS verbal subscore is 5. GCS motor subscore is 6.  Reflex Scores:      Tricep reflexes are 2+ on the right side and 2+ on the left side.      Bicep reflexes are 2+ on the right side and 2+ on the left side.      Brachioradialis reflexes are 2+ on the right side and 2+ on the left side.      Patellar reflexes are 2+ on the right side and 2+ on the left side.      Achilles reflexes are 2+ on the right side and 2+ on the left side. Speech is clear and goal oriented, follows commands Normal strength in upper and lower extremities bilaterally including dorsiflexion and plantar flexion, strong and equal grip strength Sensation normal to light and sharp touch Moves extremities without ataxia, coordination intact Normal gait Normal balance   Skin: Skin is warm and dry. No rash noted. He is not diaphoretic. No erythema.  Psychiatric: He has a normal mood and affect. His behavior is normal.    ED Course  Procedures (including critical care time) Labs Review Labs Reviewed  BASIC METABOLIC PANEL - Abnormal; Notable for the following:    Glucose, Bld 151 (*)    GFR calc non Af Amer 87 (*)    All other components within normal limits  URINALYSIS, ROUTINE W REFLEX MICROSCOPIC - Abnormal; Notable for the  following:    Color, Urine AMBER (*)    APPearance CLOUDY (*)    Specific Gravity, Urine 1.032 (*)    Bilirubin Urine SMALL (*)    All other components within normal limits  CBC  I-STAT TROPOININ, ED   Imaging Review Dg Chest 2 View  01/23/2014   CLINICAL DATA:  Cough, back pain  EXAM: CHEST  2 VIEW  COMPARISON:  DG CHEST 2 VIEW dated 08/28/2013  FINDINGS: The heart size and mediastinal contours are within normal limits. Both lungs are clear. The visualized skeletal structures are unremarkable.  IMPRESSION: No active cardiopulmonary disease.   Electronically Signed   By: Kathreen Devoid   On: 01/23/2014 20:02     EKG Interpretation   Date/Time:  Wednesday January 23 2014 19:11:27 EST Ventricular Rate:  86 PR Interval:  172 QRS Duration: 102 QT Interval:  378 QTC Calculation: 452 R Axis:   73 Text Interpretation:  Normal sinus rhythm Possible Left atrial enlargement  Borderline ECG No significant change was found Confirmed by Wyvonnia Dusky  MD,  STEPHEN [phone]) on 01/23/2014  11:10:38 PM      MDM   Final diagnoses:  Lumbar strain  Low back pain   Andrew West presents with right lower back pain and flank pain on exam. Patient without CVA tenderness and pain to palpation of the right paraspinal muscles. No history of kidney stones and denies all urinary symptoms. Will obtain UA.   No neurological deficits and normal neuro exam.  Patient can walk without difficulty or gait disturbance but states is painful.  No loss of bowel or bladder control.  No concern for cauda equina.  No fever, night sweats, weight loss, h/o cancer, IVDU.    Patient denies chest pain or shortness of breath, nausea or syncope. Troponin was drawn at triage and is negative. EKG no ischemic. CBC BMP reassuring.  His are with dry cough on exam but clear and equal breath sounds. Chest x-ray without evidence of pneumonia, pneumothorax or pulmonary edema.  Urinalysis pending. Will give pain control and muscle relaxer.  11:56  PM Pt feeling better.  No hgb in his urine.  Will d/c home with RICE protocol and pain medicine. Discussed with patient the need for follow-up and reasons to return to the ED.   It has been determined that no acute conditions requiring further emergency intervention are present at this time. The patient/guardian have been advised of the diagnosis and plan. We have discussed signs and symptoms that warrant return to the ED, such as changes or worsening in symptoms.   Vital signs are stable at discharge.   BP 125/73  Pulse 80  Temp(Src) 98.5 F (36.9 C) (Oral)  Resp 26  Ht 5\' 8"  (1.727 m)  Wt 267 lb 8 oz (121.337 kg)  BMI 40.68 kg/m2  SpO2 94%  Patient/guardian has voiced understanding and agreed to follow-up with the PCP or specialist.       Abigail Butts, PA-C 01/23/14 2357

## 2014-01-24 NOTE — ED Provider Notes (Signed)
Medical screening examination/treatment/procedure(s) were performed by non-physician practitioner and as supervising physician I was immediately available for consultation/collaboration.   EKG Interpretation   Date/Time:  Wednesday January 23 2014 19:11:27 EST Ventricular Rate:  86 PR Interval:  172 QRS Duration: 102 QT Interval:  378 QTC Calculation: 452 R Axis:   73 Text Interpretation:  Normal sinus rhythm Possible Left atrial enlargement  Borderline ECG No significant change was found Confirmed by Wyvonnia Dusky  MD,  Rhett Mutschler [phone]) on 01/23/2014 11:10:38 PM       Ezequiel Essex, MD 01/24/14 [phone]

## 2014-02-20 ENCOUNTER — Encounter (HOSPITAL_COMMUNITY): Payer: Self-pay | Admitting: Emergency Medicine

## 2014-02-20 ENCOUNTER — Emergency Department (INDEPENDENT_AMBULATORY_CARE_PROVIDER_SITE_OTHER)
Admission: EM | Admit: 2014-02-20 | Discharge: 2014-02-20 | Disposition: A | Discharge status: Home / Self Care | Payer: BC Managed Care – PPO | Source: Home / Self Care

## 2014-02-20 DIAGNOSIS — M549 Dorsalgia, unspecified: Secondary | ICD-10-CM

## 2014-02-20 DIAGNOSIS — G8929 Other chronic pain: Secondary | ICD-10-CM

## 2014-02-20 NOTE — ED Provider Notes (Signed)
CSN: [id number]     Arrival date & time 02/20/14  6378 History   First MD Initiated Contact with Patient 02/20/14 1020     Chief Complaint  Patient presents with  . Back Pain   (Consider location/radiation/quality/duration/timing/severity/associated sxs/prior Treatment) HPI Comments: 48 year old male with a history of chronic back pain for over 2 years. Approximate 2 years ago the patient had a laminectomy in which an MRI imaging postop revealed successful treatment. He continues to have back pain. He has been seeing his PCP for regular basis this evening scheduled to analgesics at least twice a month. His last refill was 6 days ago. When asked what he was expecting for [place] to do today he stated he wanted me to tell her why he is having back pain. He states he does not need any more pain medicine. There is no change in his pain. It is identical to the pain he said for the past 2 years. There has been no recent injury or trauma. No new symptoms.   Past Medical History  Diagnosis Date  . Myocardial infarction     2002; treated with stent to mid L circumflex  . Heart disease   . Hypercholesteremia   . Coronary artery disease   . NSTEMI (non-ST elevated myocardial infarction) 10/18/2011  . CAD (coronary artery disease), native coronary artery 04/18/2008  . Diabetes mellitus     Takes Metformin  . Hypertension     Takes Lisinopril   . GERD (gastroesophageal reflux disease)     Takes Dexilant  . Angina     Takes Isosorbide & Nitroglycerin  . Headache(784.0)   . Pneumonia     approx 2 years ago  . Bronchitis     hx of  . Shortness of breath     Occurs while laying down at times  . History of nuclear stress test 07/29/2011    decreased activity in septum on stress images compared with rest; wall motion abnormality, EF 53%  . Rhabdomyolysis     h/o, r/t statins  . H/O cardiac catheterization     with 5 separate dated interventions   Past Surgical History  Procedure Laterality Date  .  Coronary angioplasty with stent placement  10/09/2001    PTCA & stenting of mid AV circumflex; 2.5x23mm Pixel stent  . Colonoscopy w/ polypectomy    . Lumbar laminectomy/decompression microdiscectomy  03/31/2012    Procedure: LUMBAR LAMINECTOMY/DECOMPRESSION MICRODISCECTOMY 1 LEVEL;  Surgeon: Charlie Pitter, MD;  Location: [facility];  Service: Neurosurgery;  Laterality: Left;  . Transthoracic echocardiogram  07/28/2011    EF 55-65%; LVH, grade 1 diastolic dysfunction;   . Coronary angioplasty with stent placement  12/13/2001    PCI with stent to mid L circumflex, 95% stenosis to 0% residual  . Coronary angioplasty with stent placement  10/10/2003    PCI to mid AV circumflex; LAD 30% disease; RCA 100% occluded prox.  . Coronary angioplasty with stent placement  09/01/2011    PCI with stenting with bare metal stent to mid AV groove circumflex and PDA  . Coronary angioplasty with stent placement  10/17/2011    cutting balloon angioplasty of ostial lateral OM1 branch and bifurcation AV groove circumflex OM junction; stenosis reduced to 0%   Family History  Problem Relation Age of Onset  . Anesthesia problems Neg Hx   . Hypotension Neg Hx   . Malignant hyperthermia Neg Hx   . Pseudochol deficiency Neg Hx   . Hypertension Mother   .  Heart attack Father   . Diabetes Mother   . Heart disease Brother     x 3   . Heart attack Brother     deceased  . Hypertension Sister   . Diabetes Sister    History  Substance Use Topics  . Smoking status: Current Some Day Smoker -- 25 years    Types: Cigarettes  . Smokeless tobacco: Never Used     Comment: 1 pack lasts about 2 weeks  . Alcohol Use: No    Review of Systems  Constitutional: Negative.   Respiratory: Negative.   Gastrointestinal: Negative.   Genitourinary: Negative.   Musculoskeletal: Positive for back pain.       As per HPI  Skin: Negative.   Neurological: Negative for dizziness, weakness, numbness and headaches.       No new  paresthesias, or focal weakness. No incontinence.     Allergies  Iohexol and Vicodin  Home Medications   Current Outpatient Rx  Name  Route  Sig  Dispense  Refill  . amLODipine (NORVASC) 10 MG tablet   Oral   Take 1 tablet (10 mg total) by mouth daily.   30 tablet   0   . aspirin 81 MG tablet   Oral   Take 81 mg by mouth daily.         . clopidogrel (PLAVIX) 75 MG tablet      TAKE ONE TABLET BY MOUTH ONCE DAILY   30 tablet   6   . dexlansoprazole (DEXILANT) 60 MG capsule   Oral   Take 60 mg by mouth daily.         Marland Kitchen HYDROcodone-acetaminophen (NORCO/VICODIN) 5-325 MG per tablet   Oral   Take 1 tablet by mouth every 4 (four) hours as needed.   20 tablet   0   . isosorbide mononitrate (IMDUR) 30 MG 24 hr tablet      TAKE TWO TABLETS BY MOUTH ONCE DAILY AS  DIRECTED. PATIENT NEEDS TO CALL OFFICE TO SCHEDULE AN OPPOINTMENT.   15 tablet   0   . lisinopril-hydrochlorothiazide (PRINZIDE,ZESTORETIC) 20-12.5 MG per tablet   Oral   Take 0.5 tablets by mouth daily.          . meloxicam (MOBIC) 15 MG tablet   Oral   Take 1 tablet (15 mg total) by mouth daily.   15 tablet   0   . metFORMIN (GLUCOPHAGE) 1000 MG tablet   Oral   Take 1,000 mg by mouth 2 (two) times daily with a meal. Restart this medication on Thursday, Oct 21, 2011.         . methocarbamol (ROBAXIN) 500 MG tablet   Oral   Take 1 tablet (500 mg total) by mouth 2 (two) times daily.   20 tablet   0   . metoprolol (LOPRESSOR) 50 MG tablet   Oral   Take 1 tablet (50 mg total) by mouth 2 (two) times daily.   60 tablet   10   . moxifloxacin (VIGAMOX) 0.5 % ophthalmic solution   Ophthalmic   Apply 1 drop to eye 4 (four) times daily. For 1 week   3 mL   0   . nitroGLYCERIN (NITROSTAT) 0.4 MG SL tablet   Sublingual   Place 1 tablet (0.4 mg total) under the tongue every 5 (five) minutes as needed for chest pain.   25 tablet   3   . oxyCODONE-acetaminophen (PERCOCET) 5-325 MG per tablet  1 to 2 tablets every 6 hours as needed for pain.   20 tablet   0   . oxyCODONE-acetaminophen (PERCOCET/ROXICET) 5-325 MG per tablet   Oral   Take 1-2 tablets by mouth every 4 (four) hours as needed for severe pain.   10 tablet   0   . pravastatin (PRAVACHOL) 40 MG tablet   Oral   Take 40 mg by mouth every morning.          . simvastatin (ZOCOR) 20 MG tablet   Oral   Take 20 mg by mouth daily.         . valsartan-hydrochlorothiazide (DIOVAN-HCT) 320-12.5 MG per tablet   Oral   Take 1 tablet by mouth daily.          BP 136/90  Pulse 64  Temp(Src) 98.4 F (36.9 C) (Oral)  Resp 14  SpO2 98% Physical Exam  Constitutional: He is oriented to person, place, and time. He appears well-developed and well-nourished.  HENT:  Head: Normocephalic and atraumatic.  Eyes: EOM are normal. Left eye exhibits no discharge.  Neck: Normal range of motion. Neck supple.  Pulmonary/Chest: Effort normal. No respiratory distress.  Musculoskeletal: He exhibits tenderness. He exhibits no edema.  Tenderness along the paralumbar musculature and lumbar spine. No deformity palpated or seen.    Neurological: He is alert and oriented to person, place, and time. No cranial nerve deficit. He exhibits normal muscle tone.  Skin: Skin is warm and dry.  Psychiatric: He has a normal mood and affect.    ED Course  Procedures (including critical care time) Labs Review Labs Reviewed - No data to display Imaging Review No results found.   MDM   1. Chronic back pain greater than 3 months duration      Unable to fulfill his request for a full back pain workup in the urgent care. He has pain medication and a PCP that he has been seeing for many months. Has appointment with a back specialist in 2 weeks. Has plenty of narcotic analgesics at home.  Keep appt    Janne Napoleon, NP 02/20/14 1055

## 2014-02-20 NOTE — Discharge Instructions (Signed)
Chronic Back Pain   When back pain lasts longer than 3 months, it is called chronic back pain.People with chronic back pain often go through certain periods that are more intense (flare-ups).   CAUSES  Chronic back pain can be caused by wear and tear (degeneration) on different structures in your back. These structures include:   The bones of your spine (vertebrae) and the joints surrounding your spinal cord and nerve roots (facets).   The strong, fibrous tissues that connect your vertebrae (ligaments).  Degeneration of these structures may result in pressure on your nerves. This can lead to constant pain.  HOME CARE INSTRUCTIONS   Avoid bending, heavy lifting, prolonged sitting, and activities which make the problem worse.   Take brief periods of rest throughout the day to reduce your pain. Lying down or standing usually is better than sitting while you are resting.   Take over-the-counter or prescription medicines only as directed by your caregiver.  SEEK IMMEDIATE MEDICAL CARE IF:    You have weakness or numbness in one of your legs or feet.   You have trouble controlling your bladder or bowels.   You have nausea, vomiting, abdominal pain, shortness of breath, or fainting.  Document Released: 12/16/2004 Document Revised: 01/31/2012 Document Reviewed: 10/23/2011  [employer] Patient Information 2014 [employer]

## 2014-02-20 NOTE — ED Notes (Signed)
History of back pain x 4 days, s/p laminectomy. Pain in lower  Back x 4 days w radiation into legs, denies loss of function in legs or saddle anesthesia

## 2014-02-21 NOTE — ED Provider Notes (Signed)
Medical screening examination/treatment/procedure(s) were performed by a resident physician or non-physician practitioner and as the supervising physician I was immediately available for consultation/collaboration.  Lynne Leader, MD    Gregor Hams, MD 02/21/14 [phone]

## 2014-02-25 DIAGNOSIS — G8929 Other chronic pain: Secondary | ICD-10-CM | POA: Insufficient documentation

## 2014-02-27 ENCOUNTER — Telehealth: Payer: Self-pay | Admitting: Internal Medicine

## 2014-02-27 NOTE — Telephone Encounter (Signed)
Wants to know if there is anything that you can recommend for allergies for a heart patient .Marland Kitchen Please Call  Thanks

## 2014-02-27 NOTE — Telephone Encounter (Signed)
RN spoke to  Esto - informed her patient can use Zytrec, Claritin nothing with decongestion. RN asked if patient had an PCP . She states he does and that is what his PCP told him.

## 2014-05-16 ENCOUNTER — Encounter (HOSPITAL_COMMUNITY): Payer: Self-pay | Admitting: Emergency Medicine

## 2014-05-16 ENCOUNTER — Emergency Department (HOSPITAL_COMMUNITY)
Admission: EM | Admit: 2014-05-16 | Discharge: 2014-05-16 | Disposition: A | Discharge status: Home / Self Care | Payer: BC Managed Care – PPO | Attending: Emergency Medicine | Admitting: Emergency Medicine

## 2014-05-16 DIAGNOSIS — I252 Old myocardial infarction: Secondary | ICD-10-CM | POA: Insufficient documentation

## 2014-05-16 DIAGNOSIS — I2510 Atherosclerotic heart disease of native coronary artery without angina pectoris: Secondary | ICD-10-CM | POA: Insufficient documentation

## 2014-05-16 DIAGNOSIS — Z9889 Other specified postprocedural states: Secondary | ICD-10-CM | POA: Insufficient documentation

## 2014-05-16 DIAGNOSIS — R110 Nausea: Secondary | ICD-10-CM | POA: Insufficient documentation

## 2014-05-16 DIAGNOSIS — H9209 Otalgia, unspecified ear: Secondary | ICD-10-CM | POA: Insufficient documentation

## 2014-05-16 DIAGNOSIS — E119 Type 2 diabetes mellitus without complications: Secondary | ICD-10-CM | POA: Insufficient documentation

## 2014-05-16 DIAGNOSIS — Z8701 Personal history of pneumonia (recurrent): Secondary | ICD-10-CM | POA: Insufficient documentation

## 2014-05-16 DIAGNOSIS — K029 Dental caries, unspecified: Secondary | ICD-10-CM | POA: Insufficient documentation

## 2014-05-16 DIAGNOSIS — R05 Cough: Secondary | ICD-10-CM | POA: Insufficient documentation

## 2014-05-16 DIAGNOSIS — Z8719 Personal history of other diseases of the digestive system: Secondary | ICD-10-CM | POA: Insufficient documentation

## 2014-05-16 DIAGNOSIS — H53149 Visual discomfort, unspecified: Secondary | ICD-10-CM | POA: Insufficient documentation

## 2014-05-16 DIAGNOSIS — K089 Disorder of teeth and supporting structures, unspecified: Secondary | ICD-10-CM | POA: Insufficient documentation

## 2014-05-16 DIAGNOSIS — Z8709 Personal history of other diseases of the respiratory system: Secondary | ICD-10-CM | POA: Insufficient documentation

## 2014-05-16 DIAGNOSIS — I10 Essential (primary) hypertension: Secondary | ICD-10-CM | POA: Insufficient documentation

## 2014-05-16 DIAGNOSIS — F17200 Nicotine dependence, unspecified, uncomplicated: Secondary | ICD-10-CM | POA: Insufficient documentation

## 2014-05-16 DIAGNOSIS — R51 Headache: Secondary | ICD-10-CM | POA: Insufficient documentation

## 2014-05-16 DIAGNOSIS — R059 Cough, unspecified: Secondary | ICD-10-CM | POA: Insufficient documentation

## 2014-05-16 DIAGNOSIS — Z8739 Personal history of other diseases of the musculoskeletal system and connective tissue: Secondary | ICD-10-CM | POA: Insufficient documentation

## 2014-05-16 DIAGNOSIS — Z7982 Long term (current) use of aspirin: Secondary | ICD-10-CM | POA: Insufficient documentation

## 2014-05-16 DIAGNOSIS — Z791 Long term (current) use of non-steroidal anti-inflammatories (NSAID): Secondary | ICD-10-CM | POA: Insufficient documentation

## 2014-05-16 DIAGNOSIS — H539 Unspecified visual disturbance: Secondary | ICD-10-CM | POA: Insufficient documentation

## 2014-05-16 DIAGNOSIS — K0889 Other specified disorders of teeth and supporting structures: Secondary | ICD-10-CM

## 2014-05-16 DIAGNOSIS — Z9861 Coronary angioplasty status: Secondary | ICD-10-CM | POA: Insufficient documentation

## 2014-05-16 DIAGNOSIS — E7800 Pure hypercholesterolemia, unspecified: Secondary | ICD-10-CM | POA: Insufficient documentation

## 2014-05-16 DIAGNOSIS — Z79899 Other long term (current) drug therapy: Secondary | ICD-10-CM | POA: Insufficient documentation

## 2014-05-16 MED ORDER — PENICILLIN V POTASSIUM 500 MG PO TABS: 500.0000 mg | ORAL_TABLET | Freq: Four times a day (QID) | ORAL | Status: AC

## 2014-05-16 MED ORDER — OXYCODONE-ACETAMINOPHEN 5-325 MG PO TABS: 1.0000 | ORAL_TABLET | ORAL | Status: DC | PRN

## 2014-05-16 MED ORDER — OXYCODONE-ACETAMINOPHEN 5-325 MG PO TABS
1.0000 | ORAL_TABLET | Freq: Once | ORAL | Status: AC
  Administered 2014-05-16: 1 via ORAL
  Filled 2014-05-16: qty 1

## 2014-05-16 NOTE — ED Provider Notes (Signed)
CSN: [id number]     Arrival date & time 05/16/14  0819 History  This chart was scribed for non-physician practitioner, Clayton Bibles, PA-C working with Tanna Furry, MD by Frederich Balding, ED scribe. This patient was seen in room TR06C/TR06C and the patient's care was started at 9:27 AM.   Chief Complaint  Patient presents with  . Otalgia  . Dental Pain   The history is provided by the patient. No language interpreter was used.   HPI Comments: Andrew West is a 48 y.o. male who presents to the Emergency Department complaining of gradual onset, constant bilateral ear pain that radiates into his jaw and left lower dental pain that started 2 days ago. Rates pain 9/10. Swallowing causes pain under his left jaw. Reports nausea, headache, photophobia and blurry vision since other symptoms started. States he also has a mild, nonproductive cough. Pt thinks his symptoms are because of his teeth. He has taken advil and tylenol with no relief. Denies fever, chills, congestion, sore throat, trouble swallowing, double vision, chest pain, difficulty breathing, SOB, emesis. Denies history of migraines. Pt does not have a dentist.   Past Medical History  Diagnosis Date  . Myocardial infarction     2002; treated with stent to mid L circumflex  . Heart disease   . Hypercholesteremia   . Coronary artery disease   . NSTEMI (non-ST elevated myocardial infarction) 10/18/2011  . CAD (coronary artery disease), native coronary artery 04/18/2008  . Diabetes mellitus     Takes Metformin  . Hypertension     Takes Lisinopril   . GERD (gastroesophageal reflux disease)     Takes Dexilant  . Angina     Takes Isosorbide & Nitroglycerin  . Headache(784.0)   . Pneumonia     approx 2 years ago  . Bronchitis     hx of  . Shortness of breath     Occurs while laying down at times  . History of nuclear stress test 07/29/2011    decreased activity in septum on stress images compared with rest; wall motion abnormality, EF 53%   . Rhabdomyolysis     h/o, r/t statins  . H/O cardiac catheterization     with 5 separate dated interventions   Past Surgical History  Procedure Laterality Date  . Coronary angioplasty with stent placement  10/09/2001    PTCA & stenting of mid AV circumflex; 2.5x27mm Pixel stent  . Colonoscopy w/ polypectomy    . Lumbar laminectomy/decompression microdiscectomy  03/31/2012    Procedure: LUMBAR LAMINECTOMY/DECOMPRESSION MICRODISCECTOMY 1 LEVEL;  Surgeon: Charlie Pitter, MD;  Location: [facility];  Service: Neurosurgery;  Laterality: Left;  . Transthoracic echocardiogram  07/28/2011    EF 55-65%; LVH, grade 1 diastolic dysfunction;   . Coronary angioplasty with stent placement  12/13/2001    PCI with stent to mid L circumflex, 95% stenosis to 0% residual  . Coronary angioplasty with stent placement  10/10/2003    PCI to mid AV circumflex; LAD 30% disease; RCA 100% occluded prox.  . Coronary angioplasty with stent placement  09/01/2011    PCI with stenting with bare metal stent to mid AV groove circumflex and PDA  . Coronary angioplasty with stent placement  10/17/2011    cutting balloon angioplasty of ostial lateral OM1 branch and bifurcation AV groove circumflex OM junction; stenosis reduced to 0%   Family History  Problem Relation Age of Onset  . Anesthesia problems Neg Hx   . Hypotension Neg Hx   .  Malignant hyperthermia Neg Hx   . Pseudochol deficiency Neg Hx   . Hypertension Mother   . Heart attack Father   . Diabetes Mother   . Heart disease Brother     x 3   . Heart attack Brother     deceased  . Hypertension Sister   . Diabetes Sister    History  Substance Use Topics  . Smoking status: Current Some Day Smoker -- 25 years    Types: Cigarettes  . Smokeless tobacco: Never Used     Comment: 1 pack lasts about 2 weeks  . Alcohol Use: No    Review of Systems  Constitutional: Negative for fever and chills.  HENT: Positive for dental problem and ear pain. Negative for  congestion and sore throat.   Eyes: Positive for photophobia and visual disturbance.  Respiratory: Positive for cough. Negative for shortness of breath.   Cardiovascular: Negative for chest pain.  Gastrointestinal: Positive for nausea. Negative for vomiting.  Neurological: Positive for headaches.  All other systems reviewed and are negative.  Allergies  Iohexol and Vicodin  Home Medications   Prior to Admission medications   Medication Sig Start Date End Date Taking? Authorizing Provider  amLODipine (NORVASC) 10 MG tablet Take 1 tablet (10 mg total) by mouth daily. 10/04/13   Gregor Hams, MD  aspirin 81 MG tablet Take 81 mg by mouth daily.    Historical Provider, MD  clopidogrel (PLAVIX) 75 MG tablet TAKE ONE TABLET BY MOUTH ONCE DAILY    Pixie Casino, MD  dexlansoprazole (DEXILANT) 60 MG capsule Take 60 mg by mouth daily.    Historical Provider, MD  HYDROcodone-acetaminophen (NORCO/VICODIN) 5-325 MG per tablet Take 1 tablet by mouth every 4 (four) hours as needed. 12/21/13   Idalia Needle. Sanford, PA-C  isosorbide mononitrate (IMDUR) 30 MG 24 hr tablet TAKE TWO TABLETS BY MOUTH ONCE DAILY AS  DIRECTED. PATIENT NEEDS TO CALL OFFICE TO SCHEDULE AN OPPOINTMENT. 06/24/13   Pixie Casino, MD  lisinopril-hydrochlorothiazide (PRINZIDE,ZESTORETIC) 20-12.5 MG per tablet Take 0.5 tablets by mouth daily.     Historical Provider, MD  meloxicam (MOBIC) 15 MG tablet Take 1 tablet (15 mg total) by mouth daily. 08/14/13   Harden Mo, MD  metFORMIN (GLUCOPHAGE) 1000 MG tablet Take 1,000 mg by mouth 2 (two) times daily with a meal. Restart this medication on Thursday, Oct 21, 2011. 10/19/11   Tarri Fuller, PA-C  methocarbamol (ROBAXIN) 500 MG tablet Take 1 tablet (500 mg total) by mouth 2 (two) times daily. 01/23/14   Hannah Muthersbaugh, PA-C  metoprolol (LOPRESSOR) 50 MG tablet Take 1 tablet (50 mg total) by mouth 2 (two) times daily. 09/07/13   Pixie Casino, MD  moxifloxacin (VIGAMOX) 0.5 %  ophthalmic solution Apply 1 drop to eye 4 (four) times daily. For 1 week 12/31/13   Larene Pickett, PA-C  nitroGLYCERIN (NITROSTAT) 0.4 MG SL tablet Place 1 tablet (0.4 mg total) under the tongue every 5 (five) minutes as needed for chest pain. 09/07/13   Pixie Casino, MD  oxyCODONE-acetaminophen (PERCOCET) 5-325 MG per tablet 1 to 2 tablets every 6 hours as needed for pain. 10/04/13   Gregor Hams, MD  oxyCODONE-acetaminophen (PERCOCET/ROXICET) 5-325 MG per tablet Take 1-2 tablets by mouth every 4 (four) hours as needed for severe pain. 01/23/14   Hannah Muthersbaugh, PA-C  pravastatin (PRAVACHOL) 40 MG tablet Take 40 mg by mouth every morning.     Historical Provider, MD  simvastatin (  ZOCOR) 20 MG tablet Take 20 mg by mouth daily. 07/19/13   Pixie Casino, MD  valsartan-hydrochlorothiazide (DIOVAN-HCT) 320-12.5 MG per tablet Take 1 tablet by mouth daily.    Historical Provider, MD   BP 134/84  Pulse 83  Temp(Src) 97.6 F (36.4 C) (Oral)  Resp 18  SpO2 97%  Physical Exam  Nursing note and vitals reviewed. Constitutional: He appears well-developed and well-nourished. No distress.  HENT:  Head: Normocephalic and atraumatic.  Right Ear: Tympanic membrane and ear canal normal.  Left Ear: Tympanic membrane and ear canal normal.  Left lower second and third molar with deep decay. Tender to palpation.    Eyes: Conjunctivae and EOM are normal.  Neck: Trachea normal and normal range of motion. Neck supple. No rigidity. No edema, no erythema and normal range of motion present.  Cardiovascular: Normal rate, regular rhythm and normal heart sounds.   Pulmonary/Chest: Effort normal and breath sounds normal. No stridor. No respiratory distress. He has no wheezes. He has no rales.  Lymphadenopathy:    He has no cervical adenopathy.  Neurological: He is alert.  Skin: He is not diaphoretic.    ED Course  Procedures (including critical care time)  DIAGNOSTIC STUDIES: Oxygen Saturation is 97% on  RA, normal by my interpretation.    COORDINATION OF CARE: 9:30 AM-Discussed treatment plan which includes pain medication and an antibiotic with pt at bedside and pt agreed to plan. Will give pt dental referrals and advised him to follow up. Return precautions given.   Labs Review Labs Reviewed - No data to display  Imaging Review No results found.   EKG Interpretation None      MDM   Final diagnoses:  Pain, dental  Dental decay    Afebrile, nontoxic patient with new dental pain.  No obvious abscess.  No concerning findings on exam.  Doubt deep space head or neck infection.  Doubt Ludwig's angina.  D/C home with antibiotic, pain medication and dental follow up.  Discussed findings, treatment, and follow up  with patient.  Pt given return precautions.  Pt verbalizes understanding and agrees with plan.       I personally performed the services described in this documentation, which was scribed in my presence. The recorded information has been reviewed and is accurate.  Clayton Bibles, PA-C 05/16/14 1654

## 2014-05-16 NOTE — Discharge Instructions (Signed)
Read the information below.  Use the prescribed medication as directed.  Please discuss all new medications with your pharmacist.  Do not take additional tylenol while taking the prescribed pain medication to avoid overdose.  You may return to the Emergency Department at any time for worsening condition or any new symptoms that concern you.  Please call the dentist within 48 hours to schedule a close follow up appointment.  If you develop fevers, swelling in your face, difficulty swallowing or breathing, return to the ER immediately for a recheck.    Dental Caries  Dental caries (also called tooth decay) is the most common oral disease. It can occur at any age, but is more common in children and young adults.  HOW DENTAL CARIES DEVELOPS  The process of decay begins when bacteria and foods (particularly sugars and starches) combine in your mouth to produce plaque. Plaque is a substance that sticks to the hard, outer surface of a tooth (enamel). The bacteria in plaque produce acids that attack enamel. These acids may also attack the root surface of a tooth (cementum) if it is exposed. Repeated attacks dissolve these surfaces and create holes in the tooth (cavities). If left untreated, the acids destroy the other layers of the tooth.  RISK FACTORS  Frequent sipping of sugary beverages.   Frequent snacking on sugary and starchy foods, especially those that easily get stuck in the teeth.   Poor oral hygiene.   Dry mouth.   Substance abuse such as methamphetamine abuse.   Broken or poor-fitting dental restorations.   Eating disorders.   Gastroesophageal reflux disease (GERD).   Certain radiation treatments to the head and neck. SYMPTOMS In the early stages of dental caries, symptoms are seldom present. Sometimes white, chalky areas may be seen on the enamel or other tooth layers. In later stages, symptoms may include:  Pits and holes on the enamel.  Toothache after sweet, hot, or  cold foods or drinks are consumed.  Pain around the tooth.  Swelling around the tooth. DIAGNOSIS  Most of the time, dental caries is detected during a regular dental checkup. A diagnosis is made after a thorough medical and dental history is taken and the surfaces of your teeth are checked for signs of dental caries. Sometimes special instruments, such as lasers, are used to check for dental caries. Dental X-ray exams may be taken so that areas not visible to the eye (such as between the contact areas of the teeth) can be checked for cavities.  TREATMENT  If dental caries is in its early stages, it may be reversed with a fluoride treatment or an application of a remineralizing agent at the dental office. Thorough brushing and flossing at home is needed to aid these treatments. If it is in its later stages, treatment depends on the location and extent of tooth destruction:   If a small area of the tooth has been destroyed, the destroyed area will be removed and cavities will be filled with a material such as gold, silver amalgam, or composite resin.   If a large area of the tooth has been destroyed, the destroyed area will be removed and a cap (crown) will be fitted over the remaining tooth structure.   If the center part of the tooth (pulp) is affected, a procedure called a root canal will be needed before a filling or crown can be placed.   If most of the tooth has been destroyed, the tooth may need to be pulled (extracted).  HOME CARE INSTRUCTIONS You can prevent, stop, or reverse dental caries at home by practicing good oral hygiene. Good oral hygiene includes:  Thoroughly cleaning your teeth at least twice a day with a toothbrush and dental floss.   Using a fluoride toothpaste. A fluoride mouth rinse may also be used if recommended by your dentist or health care provider.   Restricting the amount of sugary and starchy foods and sugary liquids you consume.   Avoiding frequent  snacking on these foods and sipping of these liquids.   Keeping regular visits with a dentist for checkups and cleanings. PREVENTION   Practice good oral hygiene.  Consider a dental sealant. A dental sealant is a coating material that is applied by your dentist to the pits and grooves of teeth. The sealant prevents food from being trapped in them. It may protect the teeth for several years.  Ask about fluoride supplements if you live in a community without fluorinated water or with water that has a low fluoride content. Use fluoride supplements as directed by your dentist or health care provider.  Allow fluoride varnish applications to teeth if directed by your dentist or health care provider. Document Released: 07/31/2002 Document Revised: 07/11/2013 Document Reviewed: 11/10/2012 [facility] Patient Information 2015 [place], [place]. This information is not intended to replace advice given to you by your health care provider. Make sure you discuss any questions you have with your health care provider.  Dental Pain A tooth ache may be caused by cavities (tooth decay). Cavities expose the nerve of the tooth to air and hot or cold temperatures. It may come from an infection or abscess (also called a boil or furuncle) around your tooth. It is also often caused by dental caries (tooth decay). This causes the pain you are having. DIAGNOSIS  Your caregiver can diagnose this problem by exam. TREATMENT   If caused by an infection, it may be treated with medications which kill germs (antibiotics) and pain medications as prescribed by your caregiver. Take medications as directed.  Only take over-the-counter or prescription medicines for pain, discomfort, or fever as directed by your caregiver.  Whether the tooth ache today is caused by infection or dental disease, you should see your dentist as soon as possible for further care. SEEK MEDICAL CARE IF: The exam and treatment you received today has been  provided on an emergency basis only. This is not a substitute for complete medical or dental care. If your problem worsens or new problems (symptoms) appear, and you are unable to meet with your dentist, call or return to this location. SEEK IMMEDIATE MEDICAL CARE IF:   You have a fever.  You develop redness and swelling of your face, jaw, or neck.  You are unable to open your mouth.  You have severe pain uncontrolled by pain medicine. MAKE SURE YOU:   Understand these instructions.  Will watch your condition.  Will get help right away if you are not doing well or get worse. Document Released: 11/08/2005 Document Revised: 01/31/2012 Document Reviewed: 06/26/2008 [facility] Patient Information 2015 [place], [place]. This information is not intended to replace advice given to you by your health care provider. Make sure you discuss any questions you have with your health care provider.   Emergency Department Resource Guide 1) Find a Doctor and Pay Out of Pocket Although you won't have to find out who is covered by your insurance plan, it is a good idea to ask around and get recommendations. You will then need  to call the office and see if the doctor you have chosen will accept you as a new patient and what types of options they offer for patients who are self-pay. Some doctors offer discounts or will set up payment plans for their patients who do not have insurance, but you will need to ask so you aren't surprised when you get to your appointment.  2) Contact Your Local Health Department Not all health departments have doctors that can see patients for sick visits, but many do, so it is worth a call to see if yours does. If you don't know where your local health department is, you can check in your phone book. The CDC also has a tool to help you locate your state's health department, and many state websites also have listings of all of their local health departments.  3) Find a [facility] If your illness is not likely to be very severe or complicated, you may want to try a walk in clinic. These are popping up all over the country in pharmacies, drugstores, and shopping centers. They're usually staffed by nurse practitioners or physician assistants that have been trained to treat common illnesses and complaints. They're usually fairly quick and inexpensive. However, if you have serious medical issues or chronic medical problems, these are probably not your best option.  No Primary Care Doctor: - Call Health Connect at  [phone] - they can help you locate a primary care doctor that  accepts your insurance, provides certain services, etc. - Physician Referral Service- [phone]  Chronic Pain Problems: Organization         Address  Phone   Notes  [facility]  [phone] Patients need to be referred by their primary care doctor.   Medication Assistance: Organization         Address  Phone   Notes  [facility] Medication [facility] [place] [place], [place] [address] [phone] --Must be a resident of [facility] -- Must have NO insurance coverage whatsoever (no Medicaid/ Medicare, etc.) -- The pt. MUST have a primary care doctor that directs their care regularly and follows them in the community   MedAssist  [phone]   [employer]  [phone]    Agencies that provide inexpensive medical care: Organization         Address  Phone   Notes  [place]  [phone]   [facility]    [phone]   [facility] [place], [place] [address] [phone]   [place] [address], [place] [phone]   Planned Parenthood    [phone]   [facility]    [phone]   [place] and [address], [place] Phone:  [phone], Fax:  [phone] Hours  of Operation:  9 am - 6 pm, M-F.  Also accepts Medicaid/Medicare and self-pay.  [facility] for [place] [place], [address], [place] Phone: [phone], Fax: [phone]. Hours of Operation:  8:30 am - 5:30 pm, M-F.  Also accepts Medicaid and self-pay.  [facility] [address] [place] Phone: [phone]   [place], [place], [place] [phone], Ext. [phone] Mondays & Thursdays: 7-9 AM.  First 15 patients are seen on a first come, first serve basis.  [place] Providers:  Organization         Address  Phone   Notes  [facility] [address], [place] [phone] Also accepts self-pay patients.  [facility] [address], [place]  [phone]   [place], [address], [place] [phone]   [facility] [address], [place] [phone]   Lucianne Lei [address], [place]   [phone] Only accepts [place] Access [place] patients after they have their name applied to their card.   Self-Pay (no insurance) in [facility]:  Organization         Address  Phone   Notes  Sickle Cell Patients, [facility] [place] [phone]   [facility] [place] [phone]   [facility] [place]  [place], [place], [place] [phone]   [facility]/Dr. Osei-Bonsu  [address] [place] or [address], [place] [phone] Phone number for both [place] and [place] locations is the same.  Urgent Medical and [facility] [address] [place] [phone]   [facility] [address] [place] or [address] [phone]   [facility] [address], [place] [phone], phone; [phone], fax Sees patients 1st and 3rd Saturday of every month.  Must not qualify for public or private insurance (i.e. Medicaid, Medicare, [place] Health Choice, Veterans' Benefits)  Household income should be no more than 200% of the poverty level The clinic cannot treat you if you are pregnant or think you are pregnant  Sexually transmitted diseases are not treated at the clinic.    Dental Care: Organization         Address  Phone  Notes  [facility] Department of [facility] [place] [phone] Accepts children up to age 60 who are enrolled in [place] or [place]; pregnant women with a Medicaid card; and children who have applied for Medicaid or [place] Health Choice, but were declined, whose parents can pay a reduced fee at time of service.  [facility] Department of [facility]  [address], [place] [phone] Accepts children up to age 26 who are enrolled in [place] or [place]; pregnant women with a Medicaid card; and children who have applied for Medicaid or [place] Health Choice, but were declined, whose parents can pay a reduced fee at time of service.  [place] Adult [facility] Access PROGRAM  [place] [phone] Patients are seen by appointment only. Walk-ins are not accepted. [place] will see patients 69 years of age and older. Monday - Tuesday (8am-5pm) Most Wednesdays (8:30-5pm) $30 per visit, cash only  [facility] Adult Dental Access PROGRAM  [address], [facility] [phone] Patients are seen by appointment only. Walk-ins are not accepted. [place] will see patients 30 years of age and older. One Wednesday Evening (Monthly: Volunteer Based).  $30 per visit, cash only  [place]  [phone] for adults; Children under age 44, call Graduate  Pediatric Dentistry at [phone]. Children aged 24-14, please call [phone] to request a  pediatric application.  Dental services are provided in all areas of dental care including fillings, crowns and bridges, complete and partial dentures, implants, gum treatment, root canals, and extractions. Preventive care is also provided. Treatment is provided to both adults and children. Patients are selected via a lottery and there is often a waiting list.   [facility] [address] [place]  [phone] [email or web address]   [facility] [address] [place], [place] [phone], Ext. [phone] Second and Fourth Thursday of each month, opens at 6:30 AM; Clinic ends at 9 AM.  Patients are seen on a first-come first-served basis, and a limited number are seen during each clinic.   [facility]  [address]ard Danker [place], [place] [phone]   Eligibility Requirements You must have lived in [place], [place], or [place] counties for at least the last three months.   You cannot be eligible for state or federal sponsored [employer], including [employer], [place], or [employer].   You generally cannot be eligible for healthcare insurance through your employer.    How to apply: Eligibility screenings are held every Tuesday and Wednesday afternoon from 1:00 pm until 4:00 pm. You do not need an appointment for the interview!  [facility] [address], [place], [place]   [place]  Impact Department  [place]  [phone]    Behavioral Health Resources in the Community: Intensive Outpatient Programs Organization         Address  Phone  Notes  Lavina Elmo. [address], [place], [place] [phone]   [facility] Outpatient [address] [place], [place]   ADS: Alcohol & Drug Svcs [address], [place], [place]   [place] [address],  [place], [place] or [phone]   Substance Abuse Resources Organization         Address  Phone  Notes  Alcohol and Drug Services  [phone]   [place]  [phone]   [facility] [place]   Chinita Pester  [phone]   Residential & Outpatient Substance Abuse Program  [phone]   Psychological Services Organization         Address  Phone  Notes  [facility] [place]   [place]  [phone]   [place] [address] [place] or [phone]    Mobile Crisis Teams Organization         Address  Phone  Notes  [facility], Mobile Crisis Care Unit  [phone]   [facility]  [address] [place], [place]   [place] [address] [place] [phone]    Self-Help/Support Groups Organization         Address  Phone             Notes  [place]. of [place] - variety of support groups  [place] Call for more information  Narcotics Anonymous (NA), [facility] [address], [employer] [place]  2 meetings at this location   Special educational needs teacher         Address  Phone  Notes  [facility] Residential Treatment [place],    [place]  [place]  [place], [place],  [place], [place]   [place], [place] [phone] Admissions: 8am-3pm M-F  Incentives Substance [place] [address]    [place], [place] [phone]   [facility] [address] [place], [place], [place]   [facility] [address],  [place], [place]   [facility] Programs - Intensive Outpatient [address] Kristeen [address], [place], [place]   [facility] [address].,  [place], [place] [phone] or [phone]   [facility] Services (RTS) [address] [place], [place] Accepts Medicaid  [facility] [place] [address],  [place] [phone] Substance Abuse/Addiction Treatment   [facility] Organization         Address  Phone  Notes  [facility]  [phone]   Domenic Schwab, PhD [address] Arlis [place], [place]   [phone] or [phone]   [place], [place] [phone]   [facility] [address], [place], [place] [phone] Insurance/Medicaid/sponsorship through [facility] and Families [address] [place]                                    [place], [place] [phone] [place] [address], [place] [phone]    Dr. Adele Schilder  [phone]   [facility] Dept. 1) [address] [place] [address], [place] 2) [place] 3)  [place], [place] [phone]  [phone]   [place] [phone] or [phone] (After Hours)

## 2014-05-16 NOTE — ED Notes (Signed)
Pt sts for the past 2 days bilateral ear/mouth pain. sts hurts when he opens his mouth. Pt points to upper jaw/ear where pain is.

## 2014-05-19 NOTE — ED Provider Notes (Signed)
Medical screening examination/treatment/procedure(s) were performed by non-physician practitioner and as supervising physician I was immediately available for consultation/collaboration.   EKG Interpretation None        Tanna Furry, MD 05/19/14 [phone]

## 2014-06-21 ENCOUNTER — Emergency Department (HOSPITAL_COMMUNITY): Payer: BC Managed Care – PPO

## 2014-06-21 ENCOUNTER — Encounter (HOSPITAL_COMMUNITY): Payer: Self-pay | Admitting: Emergency Medicine

## 2014-06-21 ENCOUNTER — Emergency Department (HOSPITAL_COMMUNITY)
Admission: EM | Admit: 2014-06-21 | Discharge: 2014-06-21 | Disposition: A | Discharge status: Home / Self Care | Payer: BC Managed Care – PPO | Attending: Emergency Medicine | Admitting: Emergency Medicine

## 2014-06-21 DIAGNOSIS — Z8701 Personal history of pneumonia (recurrent): Secondary | ICD-10-CM | POA: Insufficient documentation

## 2014-06-21 DIAGNOSIS — K2991 Gastroduodenitis, unspecified, with bleeding: Principal | ICD-10-CM

## 2014-06-21 DIAGNOSIS — Z79899 Other long term (current) drug therapy: Secondary | ICD-10-CM | POA: Insufficient documentation

## 2014-06-21 DIAGNOSIS — K922 Gastrointestinal hemorrhage, unspecified: Secondary | ICD-10-CM

## 2014-06-21 DIAGNOSIS — Z9889 Other specified postprocedural states: Secondary | ICD-10-CM | POA: Insufficient documentation

## 2014-06-21 DIAGNOSIS — R1031 Right lower quadrant pain: Secondary | ICD-10-CM

## 2014-06-21 DIAGNOSIS — K625 Hemorrhage of anus and rectum: Secondary | ICD-10-CM | POA: Insufficient documentation

## 2014-06-21 DIAGNOSIS — Z7982 Long term (current) use of aspirin: Secondary | ICD-10-CM | POA: Insufficient documentation

## 2014-06-21 DIAGNOSIS — E119 Type 2 diabetes mellitus without complications: Secondary | ICD-10-CM | POA: Insufficient documentation

## 2014-06-21 DIAGNOSIS — F17200 Nicotine dependence, unspecified, uncomplicated: Secondary | ICD-10-CM | POA: Insufficient documentation

## 2014-06-21 DIAGNOSIS — I209 Angina pectoris, unspecified: Secondary | ICD-10-CM | POA: Insufficient documentation

## 2014-06-21 DIAGNOSIS — E7800 Pure hypercholesterolemia, unspecified: Secondary | ICD-10-CM | POA: Insufficient documentation

## 2014-06-21 DIAGNOSIS — I252 Old myocardial infarction: Secondary | ICD-10-CM | POA: Insufficient documentation

## 2014-06-21 DIAGNOSIS — K219 Gastro-esophageal reflux disease without esophagitis: Secondary | ICD-10-CM | POA: Insufficient documentation

## 2014-06-21 DIAGNOSIS — R42 Dizziness and giddiness: Secondary | ICD-10-CM | POA: Insufficient documentation

## 2014-06-21 DIAGNOSIS — Z8739 Personal history of other diseases of the musculoskeletal system and connective tissue: Secondary | ICD-10-CM | POA: Insufficient documentation

## 2014-06-21 DIAGNOSIS — I2510 Atherosclerotic heart disease of native coronary artery without angina pectoris: Secondary | ICD-10-CM | POA: Insufficient documentation

## 2014-06-21 DIAGNOSIS — K2971 Gastritis, unspecified, with bleeding: Secondary | ICD-10-CM | POA: Insufficient documentation

## 2014-06-21 DIAGNOSIS — I10 Essential (primary) hypertension: Secondary | ICD-10-CM | POA: Insufficient documentation

## 2014-06-21 LAB — CBC WITH DIFFERENTIAL/PLATELET
Basophils Absolute: 0 10*3/uL (ref 0.0–0.1)
Basophils Relative: 0 % (ref 0–1)
Eosinophils Absolute: 0.1 10*3/uL (ref 0.0–0.7)
Eosinophils Relative: 1 % (ref 0–5)
HEMATOCRIT: 44.4 % (ref 39.0–52.0)
HEMOGLOBIN: 15.2 g/dL (ref 13.0–17.0)
Lymphocytes Relative: 40 % (ref 12–46)
Lymphs Abs: 3.2 10*3/uL (ref 0.7–4.0)
MCH: 27.3 pg (ref 26.0–34.0)
MCHC: 34.2 g/dL (ref 30.0–36.0)
MCV: 79.9 fL (ref 78.0–100.0)
MONO ABS: 0.6 10*3/uL (ref 0.1–1.0)
MONOS PCT: 7 % (ref 3–12)
NEUTROS ABS: 4.2 10*3/uL (ref 1.7–7.7)
Neutrophils Relative %: 52 % (ref 43–77)
Platelets: 250 10*3/uL (ref 150–400)
RBC: 5.56 MIL/uL (ref 4.22–5.81)
RDW: 13.7 % (ref 11.5–15.5)
WBC: 8.1 10*3/uL (ref 4.0–10.5)

## 2014-06-21 LAB — COMPREHENSIVE METABOLIC PANEL
ALBUMIN: 4 g/dL (ref 3.5–5.2)
ALK PHOS: 63 U/L (ref 39–117)
ALT: 16 U/L (ref 0–53)
ANION GAP: 14 (ref 5–15)
AST: 16 U/L (ref 0–37)
BUN: 13 mg/dL (ref 6–23)
CO2: 25 mEq/L (ref 19–32)
Calcium: 9.1 mg/dL (ref 8.4–10.5)
Chloride: 103 mEq/L (ref 96–112)
Creatinine, Ser: 0.88 mg/dL (ref 0.50–1.35)
GFR calc Af Amer: >90 mL/min (ref >90)
GFR calc non Af Amer: >90 mL/min (ref >90)
Glucose, Bld: 87 mg/dL (ref 70–99)
POTASSIUM: 3.9 meq/L (ref 3.7–5.3)
SODIUM: 142 meq/L (ref 137–147)
TOTAL PROTEIN: 8.1 g/dL (ref 6.0–8.3)
Total Bilirubin: 0.4 mg/dL (ref 0.3–1.2)

## 2014-06-21 LAB — SAMPLE TO BLOOD BANK

## 2014-06-21 LAB — LIPASE, BLOOD: Lipase: 24 U/L (ref 11–59)

## 2014-06-21 MED ORDER — HYDROMORPHONE HCL PF 1 MG/ML IJ SOLN
0.5000 mg | Freq: Once | INTRAMUSCULAR | Status: AC
  Administered 2014-06-21: 0.5 mg via INTRAVENOUS
  Filled 2014-06-21: qty 1

## 2014-06-21 MED ORDER — ONDANSETRON HCL 4 MG PO TABS: 4.0000 mg | ORAL_TABLET | Freq: Four times a day (QID) | ORAL | Status: DC

## 2014-06-21 MED ORDER — ONDANSETRON HCL 4 MG/2ML IJ SOLN
4.0000 mg | Freq: Once | INTRAMUSCULAR | Status: AC
  Administered 2014-06-21: 4 mg via INTRAVENOUS
  Filled 2014-06-21: qty 2

## 2014-06-21 MED ORDER — SODIUM CHLORIDE 0.9 % IV BOLUS (SEPSIS)
1000.0000 mL | Freq: Once | INTRAVENOUS | Status: AC
  Administered 2014-06-21: 1000 mL via INTRAVENOUS

## 2014-06-21 MED ORDER — OXYCODONE-ACETAMINOPHEN 5-325 MG PO TABS: 1.0000 | ORAL_TABLET | ORAL | Status: DC | PRN

## 2014-06-21 NOTE — ED Notes (Signed)
Ct called to update that pt finished contrast.

## 2014-06-21 NOTE — ED Notes (Signed)
Pt placed on monitor upon arrival to room from CT

## 2014-06-21 NOTE — Discharge Instructions (Signed)
Abdominal Pain Many things can cause abdominal pain. Usually, abdominal pain is not caused by a disease and will improve without treatment. It can often be observed and treated at home. Your health care provider will do a physical exam and possibly order blood tests and X-rays to help determine the seriousness of your pain. However, in many cases, more time must pass before a clear cause of the pain can be found. Before that point, your health care provider may not know if you need more testing or further treatment. HOME CARE INSTRUCTIONS  Monitor your abdominal pain for any changes. The following actions may help to alleviate any discomfort you are experiencing:  Only take over-the-counter or prescription medicines as directed by your health care provider.  Do not take laxatives unless directed to do so by your health care provider.  Try a clear liquid diet (broth, tea, or water) as directed by your health care provider. Slowly move to a bland diet as tolerated. SEEK MEDICAL CARE IF:  You have unexplained abdominal pain.  You have abdominal pain associated with nausea or diarrhea.  You have pain when you urinate or have a bowel movement.  You experience abdominal pain that wakes you in the night.  You have abdominal pain that is worsened or improved by eating food.  You have abdominal pain that is worsened with eating fatty foods.  You have a fever. SEEK IMMEDIATE MEDICAL CARE IF:   Your pain does not go away within 2 hours.  You keep throwing up (vomiting).  Your pain is felt only in portions of the abdomen, such as the right side or the left lower portion of the abdomen.  You pass bloody or black tarry stools. MAKE SURE YOU:  Understand these instructions.   Will watch your condition.   Will get help right away if you are not doing well or get worse.  Document Released: 08/18/2005 Document Revised: 11/13/2013 Document Reviewed: 07/18/2013 [facility] Patient Information  2015 [place], [place]. This information is not intended to replace advice given to you by your health care provider. Make sure you discuss any questions you have with your health care provider.  Bloody Stools Bloody stools often mean that there is a problem in the digestive tract. Your caregiver may use the term "melena" to describe black, tarry, and bad smelling stools or "hematochezia" to describe red or maroon-colored stools. Blood seen in the stool can be caused by bleeding anywhere along the intestinal tract.  A black stool usually means that blood is coming from the upper part of the gastrointestinal tract (esophagus, stomach, or small bowel). Passing maroon-colored stools or bright red blood usually means that blood is coming from lower down in the large bowel or the rectum. However, sometimes massive bleeding in the stomach or small intestine can cause bright red bloody stools.  Consuming black licorice, lead, iron pills, medicines containing bismuth subsalicylate, or blueberries can also cause black stools. Your caregiver can test black stools to see if blood is present. It is important that the cause of the bleeding be found. Treatment can then be started, and the problem can be corrected. Rectal bleeding may not be serious, but you should not assume everything is okay until you know the cause.It is very important to follow up with your caregiver or a specialist in gastrointestinal problems. CAUSES  Blood in the stools can come from various underlying causes.Often, the cause is not found during your first visit. Testing is often needed to discover the  cause of bleeding in the gastrointestinal tract. Causes range from simple to serious or even life-threatening.Possible causes include:  Hemorrhoids.These are veins that are full of blood (engorged) in the rectum. They cause pain, inflammation, and may bleed.  Anal fissures.These are areas of painful tearing which may bleed. They are often  caused by passing hard stool.  Diverticulosis.These are pouches that form on the colon over time, with age, and may bleed significantly.  Diverticulitis.This is inflammation in areas with diverticulosis. It can cause pain, fever, and bloody stools, although bleeding is rare.  Proctitis and colitis. These are inflamed areas of the rectum or colon. They may cause pain, fever, and bloody stools.  Polyps and cancer. Colon cancer is a leading cause of preventable cancer death.It often starts out as precancerous polyps that can be removed during a colonoscopy, preventing progression into cancer. Sometimes, polyps and cancer may cause rectal bleeding.  Gastritis and ulcers.Bleeding from the upper gastrointestinal tract (near the stomach) may travel through the intestines and produce black, sometimes tarry, often bad smelling stools. In certain cases, if the bleeding is fast enough, the stools may not be black, but red and the condition may be life-threatening. SYMPTOMS  You may have stools that are bright red and bloody, that are normal color with blood on them, or that are dark black and tarry. In some cases, you may only have blood in the toilet bowl. Any of these cases need medical care. You may also have:  Pain at the anus or anywhere in the rectum.  Lightheadedness or feeling faint.  Extreme weakness.  Nausea or vomiting.  Fever. DIAGNOSIS Your caregiver may use the following methods to find the cause of your bleeding:  Taking a medical history. Age is important. Older people tend to develop polyps and cancer more often. If there is anal pain and a hard, large stool associated with bleeding, a tear of the anus may be the cause. If blood drips into the toilet after a bowel movement, bleeding hemorrhoids may be the problem. The color and frequency of the bleeding are additional considerations. In most cases, the medical history provides clues, but seldom the final answer.  A visual and  finger (digital) exam. Your caregiver will inspect the anal area, looking for tears and hemorrhoids. A finger exam can provide information when there is tenderness or a growth inside. In men, the prostate is also examined.  Endoscopy. Several types of small, long scopes (endoscopes) are used to view the colon.  In the office, your caregiver may use a rigid, or more commonly, a flexible viewing sigmoidoscope. This exam is called flexible sigmoidoscopy. It is performed in 5 to 10 minutes.  A more thorough exam is accomplished with a colonoscope. It allows your caregiver to view the entire 5 to 6 foot long colon. Medicine to help you relax (sedative) is usually given for this exam. Frequently, a bleeding lesion may be present beyond the reach of the sigmoidoscope. So, a colonoscopy may be the best exam to start with. Both exams are usually done on an outpatient basis. This means the patient does not stay overnight in the hospital or surgery center.  An upper endoscopy may be needed to examine your stomach. Sedation is used and a flexible endoscope is put in your mouth, down to your stomach.  A barium enema X-ray. This is an X-ray exam. It uses liquid barium inserted by enema into the rectum. This test alone may not identify an actual bleeding point. X-rays  highlight abnormal shadows, such as those made by lumps (tumors), diverticuli, or colitis. TREATMENT  Treatment depends on the cause of your bleeding.   For bleeding from the stomach or colon, the caregiver doing your endoscopy or colonoscopy may be able to stop the bleeding as part of the procedure.  Inflammation or infection of the colon can be treated with medicines.  Many rectal problems can be treated with creams, suppositories, or warm baths.  Surgery is sometimes needed.  Blood transfusions are sometimes needed if you have lost a lot of blood.  For any bleeding problem, let your caregiver know if you take aspirin or other blood thinners  regularly. HOME CARE INSTRUCTIONS   Take any medicines exactly as prescribed.  Keep your stools soft by eating a diet high in fiber. Prunes (1 to 3 a day) work well for many people.  Drink enough water and fluids to keep your urine clear or pale yellow.  Take sitz baths if advised. A sitz bath is when you sit in a bathtub with warm water for 10 to 15 minutes to soak, soothe, and cleanse the rectal area.  If enemas or suppositories are advised, be sure you know how to use them. Tell your caregiver if you have problems with this.  Monitor your bowel movements to look for signs of improvement or worsening. SEEK MEDICAL CARE IF:   You do not improve in the time expected.  Your condition worsens after initial improvement.  You develop any new symptoms. SEEK IMMEDIATE MEDICAL CARE IF:   You develop severe or prolonged rectal bleeding.  You vomit blood.  You feel weak or faint.  You have a fever. MAKE SURE YOU:  Understand these instructions.  Will watch your condition.  Will get help right away if you are not doing well or get worse. Document Released: 10/29/2002 Document Revised: 01/31/2012 Document Reviewed: 03/26/2011 [facility] Patient Information 2015 [place], [place]. This information is not intended to replace advice given to you by your health care provider. Make sure you discuss any questions you have with your health care provider.

## 2014-06-21 NOTE — ED Notes (Signed)
Pt continues to be monitored by 5 lead, blood pressure, and pulse ox.

## 2014-06-21 NOTE — ED Provider Notes (Signed)
CSN: [id number]     Arrival date & time 06/21/14  1255 History   First MD Initiated Contact with Patient 06/21/14 [phone]     Chief Complaint  Patient presents with  . Rectal Bleeding  . Abdominal Pain     (Consider location/radiation/quality/duration/timing/severity/associated sxs/prior Treatment) Patient is a 48 y.o. male presenting with hematochezia. The history is provided by the patient.  Rectal Bleeding Quality:  Black and tarry Amount:  Moderate Duration:  1 week Timing:  Intermittent Progression:  Unchanged Chronicity:  Recurrent Context: spontaneously   Context: not anal fissures, not anal penetration, not constipation, not defecation, not diarrhea, not foreign body, not hemorrhoids, not rectal injury and not rectal pain   Similar prior episodes: yes   Relieved by:  None tried Worsened by:  Nothing tried Ineffective treatments:  None tried Associated symptoms: abdominal pain   Associated symptoms: no dizziness, no epistaxis, no fever, no hematemesis, no light-headedness, no loss of consciousness, no recent illness and no vomiting   Abdominal pain:    Location:  RLQ   Quality:  Sharp   Severity:  Moderate   Onset quality:  Gradual   Duration:  1 week   Timing:  Intermittent   Progression:  Waxing and waning   Chronicity:  New Risk factors: hx of colorectal surgery and NSAID use   Risk factors: no anticoagulant use, no hx of colorectal cancer, no hx of IBD, no liver disease and no steroid use     Patient endorses rectal bleeding x1 week. States the stools have been dark and sticky. Patient has history of similar symptoms in the past following polyp resection. Patient had a normal colonoscopy one year ago. He saw his primary physician today and endorsed right lower quadrant pain. Primary physician documented positive and hemoccult exam and was concerned for possible diverticulitis. The patient also had a rectal tenderness documented in note received from PCPs office.     Past Medical History  Diagnosis Date  . Myocardial infarction     2002; treated with stent to mid L circumflex  . Heart disease   . Hypercholesteremia   . Coronary artery disease   . NSTEMI (non-ST elevated myocardial infarction) 10/18/2011  . CAD (coronary artery disease), native coronary artery 04/18/2008  . Diabetes mellitus     Takes Metformin  . Hypertension     Takes Lisinopril   . GERD (gastroesophageal reflux disease)     Takes Dexilant  . Angina     Takes Isosorbide & Nitroglycerin  . Headache(784.0)   . Pneumonia     approx 2 years ago  . Bronchitis     hx of  . Shortness of breath     Occurs while laying down at times  . History of nuclear stress test 07/29/2011    decreased activity in septum on stress images compared with rest; wall motion abnormality, EF 53%  . Rhabdomyolysis     h/o, r/t statins  . H/O cardiac catheterization     with 5 separate dated interventions   Past Surgical History  Procedure Laterality Date  . Coronary angioplasty with stent placement  10/09/2001    PTCA & stenting of mid AV circumflex; 2.5x109mm Pixel stent  . Colonoscopy w/ polypectomy    . Lumbar laminectomy/decompression microdiscectomy  03/31/2012    Procedure: LUMBAR LAMINECTOMY/DECOMPRESSION MICRODISCECTOMY 1 LEVEL;  Surgeon: Charlie Pitter, MD;  Location: [facility];  Service: Neurosurgery;  Laterality: Left;  . Transthoracic echocardiogram  07/28/2011    EF  55-65%; LVH, grade 1 diastolic dysfunction;   . Coronary angioplasty with stent placement  12/13/2001    PCI with stent to mid L circumflex, 95% stenosis to 0% residual  . Coronary angioplasty with stent placement  10/10/2003    PCI to mid AV circumflex; LAD 30% disease; RCA 100% occluded prox.  . Coronary angioplasty with stent placement  09/01/2011    PCI with stenting with bare metal stent to mid AV groove circumflex and PDA  . Coronary angioplasty with stent placement  10/17/2011    cutting balloon angioplasty of  ostial lateral OM1 branch and bifurcation AV groove circumflex OM junction; stenosis reduced to 0%   Family History  Problem Relation Age of Onset  . Anesthesia problems Neg Hx   . Hypotension Neg Hx   . Malignant hyperthermia Neg Hx   . Pseudochol deficiency Neg Hx   . Hypertension Mother   . Heart attack Father   . Diabetes Mother   . Heart disease Brother     x 3   . Heart attack Brother     deceased  . Hypertension Sister   . Diabetes Sister    History  Substance Use Topics  . Smoking status: Current Some Day Smoker -- 25 years    Types: Cigarettes  . Smokeless tobacco: Never Used     Comment: 1 pack lasts about 2 weeks  . Alcohol Use: No    Review of Systems  Constitutional: Negative.  Negative for fever.  HENT: Negative.  Negative for nosebleeds.   Eyes: Negative.   Respiratory: Negative.   Cardiovascular: Negative.   Gastrointestinal: Positive for abdominal pain, blood in stool, hematochezia, anal bleeding and rectal pain. Negative for vomiting and hematemesis.  Endocrine: Negative.   Genitourinary: Negative.   Musculoskeletal: Negative.   Skin: Negative.   Allergic/Immunologic: Negative.   Neurological: Negative.  Negative for dizziness, loss of consciousness and light-headedness.  Hematological: Negative.   Psychiatric/Behavioral: Negative.       Allergies  Iohexol and Vicodin  Home Medications   Prior to Admission medications   Medication Sig Start Date End Date Taking? Authorizing Provider  amLODipine (NORVASC) 10 MG tablet Take 1 tablet (10 mg total) by mouth daily. 10/04/13  Yes Gregor Hams, MD  aspirin 325 MG tablet Take 325 mg by mouth daily.   Yes Historical Provider, MD  clopidogrel (PLAVIX) 75 MG tablet Take 75 mg by mouth daily.   Yes Historical Provider, MD  dexlansoprazole (DEXILANT) 60 MG capsule Take 60 mg by mouth daily.   Yes Historical Provider, MD  HYDROcodone-acetaminophen (NORCO/VICODIN) 5-325 MG per tablet Take 1 tablet by mouth  every 4 (four) hours as needed. 12/21/13  Yes Joaquim Lai C. Sanford, PA-C  isosorbide mononitrate (IMDUR) 30 MG 24 hr tablet Take 30 mg by mouth 2 (two) times daily.   Yes Historical Provider, MD  lisinopril-hydrochlorothiazide (PRINZIDE,ZESTORETIC) 20-12.5 MG per tablet Take 0.5 tablets by mouth daily.    Yes Historical Provider, MD  meloxicam (MOBIC) 15 MG tablet Take 1 tablet (15 mg total) by mouth daily. 08/14/13  Yes Harden Mo, MD  metFORMIN (GLUCOPHAGE) 1000 MG tablet Take 1,000 mg by mouth 2 (two) times daily with a meal. Restart this medication on Thursday, Oct 21, 2011. 10/19/11  Yes Tarri Fuller, PA-C  methocarbamol (ROBAXIN) 500 MG tablet Take 1 tablet (500 mg total) by mouth 2 (two) times daily. 01/23/14  Yes Hannah Muthersbaugh, PA-C  metoprolol (LOPRESSOR) 50 MG tablet Take 1 tablet (50 mg  total) by mouth 2 (two) times daily. 09/07/13  Yes Pixie Casino, MD  nitroGLYCERIN (NITROSTAT) 0.4 MG SL tablet Place 1 tablet (0.4 mg total) under the tongue every 5 (five) minutes as needed for chest pain. 09/07/13  Yes Pixie Casino, MD  pravastatin (PRAVACHOL) 40 MG tablet Take 40 mg by mouth every morning.    Yes Historical Provider, MD  simvastatin (ZOCOR) 20 MG tablet Take 20 mg by mouth at bedtime.  07/19/13  Yes Pixie Casino, MD  valsartan-hydrochlorothiazide (DIOVAN-HCT) 320-12.5 MG per tablet Take 1 tablet by mouth daily.   Yes Historical Provider, MD  ondansetron (ZOFRAN) 4 MG tablet Take 1 tablet (4 mg total) by mouth every 6 (six) hours. 06/21/14   Hoyle Sauer, MD  oxyCODONE-acetaminophen (PERCOCET/ROXICET) 5-325 MG per tablet Take 1 tablet by mouth every 4 (four) hours as needed for moderate pain or severe pain. 06/21/14   Hoyle Sauer, MD   BP 120/73  Pulse 89  Temp(Src) 98.9 F (37.2 C) (Oral)  Resp 29  SpO2 92% Physical Exam  Nursing note and vitals reviewed. Constitutional: He is oriented to person, place, and time. He appears well-developed and well-nourished. No  distress.  HENT:  Head: Normocephalic and atraumatic.  Right Ear: External ear normal.  Left Ear: External ear normal.  Nose: Nose normal.  Mouth/Throat: Oropharynx is clear and moist. No oropharyngeal exudate.  Eyes: Conjunctivae and EOM are normal. Pupils are equal, round, and reactive to light. Right eye exhibits no discharge. Left eye exhibits no discharge. No scleral icterus.  Neck: Normal range of motion. Neck supple. No JVD present. No tracheal deviation present. No thyromegaly present.  Cardiovascular: Normal rate, regular rhythm, normal heart sounds and intact distal pulses.  Exam reveals no gallop and no friction rub.   No murmur heard. Pulmonary/Chest: Effort normal and breath sounds normal. No stridor. No respiratory distress. He has no wheezes. He has no rales. He exhibits no tenderness.  Abdominal: Soft. Bowel sounds are normal. He exhibits no distension and no mass. There is tenderness in the right lower quadrant. There is tenderness at McBurney's point. There is no rigidity, no rebound, no guarding and negative Murphy's sign.  Genitourinary:  Rectal exam declined.  Had exam today documented by PCP.  States it was "painful."  Musculoskeletal: Normal range of motion. He exhibits no edema and no tenderness.  Lymphadenopathy:    He has no cervical adenopathy.  Neurological: He is alert and oriented to person, place, and time.  Skin: Skin is warm and dry. No rash noted. He is not diaphoretic. No erythema. No pallor.  Psychiatric: He has a normal mood and affect. His behavior is normal. Judgment and thought content normal.    ED Course  Procedures (including critical care time) Labs Review Labs Reviewed  COMPREHENSIVE METABOLIC PANEL  CBC WITH DIFFERENTIAL  LIPASE, BLOOD  SAMPLE TO BLOOD BANK    Imaging Review Ct Abdomen Pelvis Wo Contrast  06/21/2014   CLINICAL DATA:  Lower abdominal pain  EXAM: CT ABDOMEN AND PELVIS WITHOUT CONTRAST  TECHNIQUE: Multidetector CT imaging  of the abdomen and pelvis was performed following the standard protocol without IV contrast.  COMPARISON:  03/25/2009  FINDINGS: Lung bases are free of acute infiltrate or sizable effusion.  The liver, gallbladder, spleen, adrenal glands and pancreas are all normal in their CT appearance. The kidneys are well visualized bilaterally without renal calculi or obstructive changes. The appendix is unremarkable. Minimal diverticular change is seen. No findings to suggest  diverticulitis are noted. The bladder is partially distended. No pelvic mass lesion or sidewall abnormality is noted. The bony structures show no acute abnormality.  IMPRESSION: No acute abnormality noted.   Electronically Signed   By: Inez Catalina M.D.   On: 06/21/2014 20:03     EKG Interpretation None      MDM   Final diagnoses:  Right lower quadrant abdominal pain  Gastrointestinal hemorrhage, unspecified gastritis, unspecified gastrointestinal hemorrhage type    Concern for pain in right lower quadrant from possible appendicitis, also possibly from GI bleed,  in addition cecal diverticulitis is also a possibility but less likely. Patient on dorsum left lower quadrant pain at this time vital signs stable afebrile. Will treat symptoms, give IV fluids, obtain basic labs, and we'll get a CT scan of the abdomen without contrast this patient has a severe contrast allergy.  Patient's symptoms improved with IV hydration antiemetics and pain control. CT scan does not show any specific cause for his pain. Low concern for acute surgical intra-abdominal pathology patient's labs also not suggestive of acute infectious process at this time. Will likely need outpatient followup with endoscopy to determine source of his bleeding. No significant change in hemoglobin to suggest severe bleeding. Patient declined a repeat rectal examination I personally did review documentation from PCPs office of a positive Hemoccult sample.  Patient advised to follow  up with primary care physician for additional recommendations and workup for his GI bleeding and provided with antiemetics and pain control Rx upon discharge.   Patient and family given return precautions for GI bleeding and abdominal pain.  Advised to return for worsening symptoms including chest pain, shortness of breath, severe headache, intractable nausea or vomiting, fever, or chills, inability to take medications, or other acute concerns.  Advised to follow up with PCP in 3 days.  Patient and family in agreement with and expressed understanding of follow plan, plan of care, and return precautions.  All questions answered prior to discharge.  Patient was discharged in stable condition with family, ambulating without difficulty.  Patient care was discussed with my attending, Dr. Jeanell Sparrow.      Hoyle Sauer, MD 06/22/14 [phone]

## 2014-06-21 NOTE — ED Notes (Signed)
Pt c/o lower abd and side pain; pt sts some blood in stool; pt sts sent here by PCP for eval of possible diverticulitis

## 2014-06-23 NOTE — ED Provider Notes (Signed)
48 y.o. Male with rlq pain and some rectal bleeding.  Patient with some rlq ttp.  CT obtained with no acute abnormalities.  I performed a history and physical examination of Andrew West and discussed his management with Dr. Estanislado Spire.  I agree with the history, physical, assessment, and plan of care, with the following exceptions: None  I was present for the following procedures: None Time Spent in Critical Care of the patient: None Time spent in discussions with the patient and family: 5  Signe Tackitt Shelda Jakes, MD 06/23/14 [phone]

## 2014-07-13 ENCOUNTER — Encounter (HOSPITAL_COMMUNITY): Payer: Self-pay | Admitting: Emergency Medicine

## 2014-07-13 ENCOUNTER — Emergency Department (HOSPITAL_COMMUNITY)
Admission: EM | Admit: 2014-07-13 | Discharge: 2014-07-13 | Disposition: A | Discharge status: Home / Self Care | Payer: BC Managed Care – PPO | Attending: Emergency Medicine | Admitting: Emergency Medicine

## 2014-07-13 ENCOUNTER — Emergency Department (HOSPITAL_COMMUNITY): Payer: BC Managed Care – PPO

## 2014-07-13 DIAGNOSIS — F17200 Nicotine dependence, unspecified, uncomplicated: Secondary | ICD-10-CM | POA: Diagnosis not present

## 2014-07-13 DIAGNOSIS — E7800 Pure hypercholesterolemia, unspecified: Secondary | ICD-10-CM | POA: Diagnosis not present

## 2014-07-13 DIAGNOSIS — M5441 Lumbago with sciatica, right side: Secondary | ICD-10-CM

## 2014-07-13 DIAGNOSIS — R011 Cardiac murmur, unspecified: Secondary | ICD-10-CM | POA: Insufficient documentation

## 2014-07-13 DIAGNOSIS — Z951 Presence of aortocoronary bypass graft: Secondary | ICD-10-CM | POA: Diagnosis not present

## 2014-07-13 DIAGNOSIS — M6282 Rhabdomyolysis: Secondary | ICD-10-CM | POA: Diagnosis not present

## 2014-07-13 DIAGNOSIS — Y9389 Activity, other specified: Secondary | ICD-10-CM | POA: Insufficient documentation

## 2014-07-13 DIAGNOSIS — S79919A Unspecified injury of unspecified hip, initial encounter: Secondary | ICD-10-CM | POA: Diagnosis not present

## 2014-07-13 DIAGNOSIS — I2510 Atherosclerotic heart disease of native coronary artery without angina pectoris: Secondary | ICD-10-CM | POA: Insufficient documentation

## 2014-07-13 DIAGNOSIS — K219 Gastro-esophageal reflux disease without esophagitis: Secondary | ICD-10-CM | POA: Insufficient documentation

## 2014-07-13 DIAGNOSIS — I10 Essential (primary) hypertension: Secondary | ICD-10-CM | POA: Insufficient documentation

## 2014-07-13 DIAGNOSIS — Y9289 Other specified places as the place of occurrence of the external cause: Secondary | ICD-10-CM | POA: Diagnosis not present

## 2014-07-13 DIAGNOSIS — Z8701 Personal history of pneumonia (recurrent): Secondary | ICD-10-CM | POA: Insufficient documentation

## 2014-07-13 DIAGNOSIS — Z79899 Other long term (current) drug therapy: Secondary | ICD-10-CM | POA: Diagnosis not present

## 2014-07-13 DIAGNOSIS — Z7902 Long term (current) use of antithrombotics/antiplatelets: Secondary | ICD-10-CM | POA: Insufficient documentation

## 2014-07-13 DIAGNOSIS — W11XXXA Fall on and from ladder, initial encounter: Secondary | ICD-10-CM | POA: Insufficient documentation

## 2014-07-13 DIAGNOSIS — I209 Angina pectoris, unspecified: Secondary | ICD-10-CM | POA: Insufficient documentation

## 2014-07-13 DIAGNOSIS — M25551 Pain in right hip: Secondary | ICD-10-CM

## 2014-07-13 DIAGNOSIS — E119 Type 2 diabetes mellitus without complications: Secondary | ICD-10-CM | POA: Insufficient documentation

## 2014-07-13 DIAGNOSIS — I252 Old myocardial infarction: Secondary | ICD-10-CM | POA: Diagnosis not present

## 2014-07-13 DIAGNOSIS — IMO0002 Reserved for concepts with insufficient information to code with codable children: Secondary | ICD-10-CM | POA: Insufficient documentation

## 2014-07-13 DIAGNOSIS — S79929A Unspecified injury of unspecified thigh, initial encounter: Secondary | ICD-10-CM

## 2014-07-13 DIAGNOSIS — R51 Headache: Secondary | ICD-10-CM | POA: Insufficient documentation

## 2014-07-13 DIAGNOSIS — Z791 Long term (current) use of non-steroidal anti-inflammatories (NSAID): Secondary | ICD-10-CM | POA: Insufficient documentation

## 2014-07-13 DIAGNOSIS — Z7982 Long term (current) use of aspirin: Secondary | ICD-10-CM | POA: Diagnosis not present

## 2014-07-13 DIAGNOSIS — Z9889 Other specified postprocedural states: Secondary | ICD-10-CM | POA: Insufficient documentation

## 2014-07-13 LAB — I-STAT CHEM 8, ED
BUN: 11 mg/dL (ref 6–23)
CHLORIDE: 104 meq/L (ref 96–112)
Calcium, Ion: 1.06 mmol/L — ABNORMAL LOW (ref 1.12–1.23)
Creatinine, Ser: 0.8 mg/dL (ref 0.50–1.35)
Glucose, Bld: 108 mg/dL — ABNORMAL HIGH (ref 70–99)
HCT: 49 % (ref 39.0–52.0)
Hemoglobin: 16.7 g/dL (ref 13.0–17.0)
Potassium: 3.5 mEq/L — ABNORMAL LOW (ref 3.7–5.3)
Sodium: 135 mEq/L — ABNORMAL LOW (ref 137–147)
TCO2: 26 mmol/L (ref 0–100)

## 2014-07-13 MED ORDER — OXYCODONE-ACETAMINOPHEN 5-325 MG PO TABS: 1.0000 | ORAL_TABLET | Freq: Three times a day (TID) | ORAL | Status: DC | PRN

## 2014-07-13 MED ORDER — MELOXICAM 7.5 MG PO TABS: 7.5000 mg | ORAL_TABLET | Freq: Every day | ORAL | Status: DC

## 2014-07-13 MED ORDER — OXYCODONE-ACETAMINOPHEN 5-325 MG PO TABS
1.0000 | ORAL_TABLET | Freq: Once | ORAL | Status: AC
  Administered 2014-07-13: 1 via ORAL
  Filled 2014-07-13: qty 1

## 2014-07-13 MED ORDER — DIAZEPAM 5 MG PO TABS
5.0000 mg | ORAL_TABLET | Freq: Once | ORAL | Status: AC
  Administered 2014-07-13: 5 mg via ORAL
  Filled 2014-07-13: qty 1

## 2014-07-13 MED ORDER — DIAZEPAM 5 MG PO TABS: 5.0000 mg | ORAL_TABLET | Freq: Two times a day (BID) | ORAL | Status: DC | PRN

## 2014-07-13 NOTE — Discharge Instructions (Signed)
Sciatica Sciatica is pain, weakness, numbness, or tingling along the path of the sciatic nerve. The nerve starts in the lower back and runs down the back of each leg. The nerve controls the muscles in the lower leg and in the back of the knee, while also providing sensation to the back of the thigh, lower leg, and the sole of your foot. Sciatica is a symptom of another medical condition. For instance, nerve damage or certain conditions, such as a herniated disk or bone spur on the spine, pinch or put pressure on the sciatic nerve. This causes the pain, weakness, or other sensations normally associated with sciatica. Generally, sciatica only affects one side of the body. CAUSES   Herniated or slipped disc.  Degenerative disk disease.  A pain disorder involving the narrow muscle in the buttocks (piriformis syndrome).  Pelvic injury or fracture.  Pregnancy.  Tumor (rare). SYMPTOMS  Symptoms can vary from mild to very severe. The symptoms usually travel from the low back to the buttocks and down the back of the leg. Symptoms can include:  Mild tingling or dull aches in the lower back, leg, or hip.  Numbness in the back of the calf or sole of the foot.  Burning sensations in the lower back, leg, or hip.  Sharp pains in the lower back, leg, or hip.  Leg weakness.  Severe back pain inhibiting movement. These symptoms may get worse with coughing, sneezing, laughing, or prolonged sitting or standing. Also, being overweight may worsen symptoms. DIAGNOSIS  Your caregiver will perform a physical exam to look for common symptoms of sciatica. He or she may ask you to do certain movements or activities that would trigger sciatic nerve pain. Other tests may be performed to find the cause of the sciatica. These may include:  Blood tests.  X-rays.  Imaging tests, such as an MRI or CT scan. TREATMENT  Treatment is directed at the cause of the sciatic pain. Sometimes, treatment is not necessary  and the pain and discomfort goes away on its own. If treatment is needed, your caregiver may suggest:  Over-the-counter medicines to relieve pain.  Prescription medicines, such as anti-inflammatory medicine, muscle relaxants, or narcotics.  Applying heat or ice to the painful area.  Steroid injections to lessen pain, irritation, and inflammation around the nerve.  Reducing activity during periods of pain.  Exercising and stretching to strengthen your abdomen and improve flexibility of your spine. Your caregiver may suggest losing weight if the extra weight makes the back pain worse.  Physical therapy.  Surgery to eliminate what is pressing or pinching the nerve, such as a bone spur or part of a herniated disk. HOME CARE INSTRUCTIONS   Only take over-the-counter or prescription medicines for pain or discomfort as directed by your caregiver.  Apply ice to the affected area for 20 minutes, 3-4 times a day for the first 48-72 hours. Then try heat in the same way.  Exercise, stretch, or perform your usual activities if these do not aggravate your pain.  Attend physical therapy sessions as directed by your caregiver.  Keep all follow-up appointments as directed by your caregiver.  Do not wear high heels or shoes that do not provide proper support.  Check your mattress to see if it is too soft. A firm mattress may lessen your pain and discomfort. SEEK IMMEDIATE MEDICAL CARE IF:   You lose control of your bowel or bladder (incontinence).  You have increasing weakness in the lower back, pelvis, buttocks,   or legs.  You have redness or swelling of your back.  You have a burning sensation when you urinate.  You have pain that gets worse when you lie down or awakens you at night.  Your pain is worse than you have experienced in the past.  Your pain is lasting longer than 4 weeks.  You are suddenly losing weight without reason. MAKE SURE YOU:  Understand these  instructions.  Will watch your condition.  Will get help right away if you are not doing well or get worse. Document Released: 11/02/2001 Document Revised: 05/09/2012 Document Reviewed: 03/19/2012 [employer] Patient Information 2015 [employer]. This information is not intended to replace advice given to you by your health care provider. Make sure you discuss any questions you have with your health care provider.  

## 2014-07-13 NOTE — ED Provider Notes (Signed)
CSN: [id number]     Arrival date & time 07/13/14  0910 History  This chart was scribed for non-physician practitioner Starlyn Skeans PA-C working with Arbie Cookey, * by Ludger Nutting, ED Scribe. This patient was seen in room TR05C/TR05C and the patient's care was started at 9:27 AM.    Chief Complaint  Patient presents with  . Back Pain  . Leg Pain    The history is provided by the patient. No language interpreter was used.    HPI Comments: Andrew West is a 48 y.o. male who presents to the Emergency Department complaining of a fall that occurred yesterday. Patient states he was using a step ladder when he fell off of the 4th step, about 3 ft from the ground. He reports striking the right side of his body but denies head injury or LOC. He reports constant right lower back pain and right hip pain with associated tingling sensations to the right leg. He describes his pain as dull, aching and rates it as 7-8/10. He has taken a warm bath and used ibuprofen without relief. He is able to ambulate and bear weight. He denies swelling, fever, chills, nausea, vomiting, bowel/bladder incontinence, saddle anesthesia, headache, changes in his vision, bowel or bladder changes.  All other ROS are negative at this time.  He reports a prior discectomy remotely.   Past Medical History  Diagnosis Date  . Myocardial infarction     2002; treated with stent to mid L circumflex  . Heart disease   . Hypercholesteremia   . Coronary artery disease   . NSTEMI (non-ST elevated myocardial infarction) 10/18/2011  . CAD (coronary artery disease), native coronary artery 04/18/2008  . Diabetes mellitus     Takes Metformin  . Hypertension     Takes Lisinopril   . GERD (gastroesophageal reflux disease)     Takes Dexilant  . Angina     Takes Isosorbide & Nitroglycerin  . Headache(784.0)   . Pneumonia     approx 2 years ago  . Bronchitis     hx of  . Shortness of breath     Occurs while laying down at  times  . History of nuclear stress test 07/29/2011    decreased activity in septum on stress images compared with rest; wall motion abnormality, EF 53%  . Rhabdomyolysis     h/o, r/t statins  . H/O cardiac catheterization     with 5 separate dated interventions   Past Surgical History  Procedure Laterality Date  . Coronary angioplasty with stent placement  10/09/2001    PTCA & stenting of mid AV circumflex; 2.5x35mm Pixel stent  . Colonoscopy w/ polypectomy    . Lumbar laminectomy/decompression microdiscectomy  03/31/2012    Procedure: LUMBAR LAMINECTOMY/DECOMPRESSION MICRODISCECTOMY 1 LEVEL;  Surgeon: Charlie Pitter, MD;  Location: [facility];  Service: Neurosurgery;  Laterality: Left;  . Transthoracic echocardiogram  07/28/2011    EF 55-65%; LVH, grade 1 diastolic dysfunction;   . Coronary angioplasty with stent placement  12/13/2001    PCI with stent to mid L circumflex, 95% stenosis to 0% residual  . Coronary angioplasty with stent placement  10/10/2003    PCI to mid AV circumflex; LAD 30% disease; RCA 100% occluded prox.  . Coronary angioplasty with stent placement  09/01/2011    PCI with stenting with bare metal stent to mid AV groove circumflex and PDA  . Coronary angioplasty with stent placement  10/17/2011    cutting balloon  angioplasty of ostial lateral OM1 branch and bifurcation AV groove circumflex OM junction; stenosis reduced to 0%   Family History  Problem Relation Age of Onset  . Anesthesia problems Neg Hx   . Hypotension Neg Hx   . Malignant hyperthermia Neg Hx   . Pseudochol deficiency Neg Hx   . Hypertension Mother   . Heart attack Father   . Diabetes Mother   . Heart disease Brother     x 3   . Heart attack Brother     deceased  . Hypertension Sister   . Diabetes Sister    History  Substance Use Topics  . Smoking status: Current Some Day Smoker -- 25 years    Types: Cigarettes  . Smokeless tobacco: Never Used     Comment: 1 pack lasts about 2 weeks  .  Alcohol Use: No    Review of Systems See HPI   Allergies  Iohexol and Vicodin  Home Medications   Prior to Admission medications   Medication Sig Start Date End Date Taking? Authorizing Provider  amLODipine (NORVASC) 10 MG tablet Take 1 tablet (10 mg total) by mouth daily. 10/04/13   Gregor Hams, MD  aspirin 325 MG tablet Take 325 mg by mouth daily.    Historical Provider, MD  clopidogrel (PLAVIX) 75 MG tablet Take 75 mg by mouth daily.    Historical Provider, MD  dexlansoprazole (DEXILANT) 60 MG capsule Take 60 mg by mouth daily.    Historical Provider, MD  diazepam (VALIUM) 5 MG tablet Take 1 tablet (5 mg total) by mouth every 12 (twelve) hours as needed for muscle spasms. 07/13/14   Givanni Staron A Forcucci, PA-C  HYDROcodone-acetaminophen (NORCO/VICODIN) 5-325 MG per tablet Take 1 tablet by mouth every 4 (four) hours as needed. 12/21/13   Idalia Needle. Sanford, PA-C  isosorbide mononitrate (IMDUR) 30 MG 24 hr tablet Take 30 mg by mouth 2 (two) times daily.    Historical Provider, MD  lisinopril-hydrochlorothiazide (PRINZIDE,ZESTORETIC) 20-12.5 MG per tablet Take 0.5 tablets by mouth daily.     Historical Provider, MD  meloxicam (MOBIC) 15 MG tablet Take 1 tablet (15 mg total) by mouth daily. 08/14/13   Harden Mo, MD  meloxicam (MOBIC) 7.5 MG tablet Take 1 tablet (7.5 mg total) by mouth daily. 07/13/14   Filmore Molyneux A Forcucci, PA-C  metFORMIN (GLUCOPHAGE) 1000 MG tablet Take 1,000 mg by mouth 2 (two) times daily with a meal. Restart this medication on Thursday, Oct 21, 2011. 10/19/11   Tarri Fuller, PA-C  methocarbamol (ROBAXIN) 500 MG tablet Take 1 tablet (500 mg total) by mouth 2 (two) times daily. 01/23/14   Hannah Muthersbaugh, PA-C  metoprolol (LOPRESSOR) 50 MG tablet Take 1 tablet (50 mg total) by mouth 2 (two) times daily. 09/07/13   Pixie Casino, MD  nitroGLYCERIN (NITROSTAT) 0.4 MG SL tablet Place 1 tablet (0.4 mg total) under the tongue every 5 (five) minutes as needed for chest  pain. 09/07/13   Pixie Casino, MD  ondansetron (ZOFRAN) 4 MG tablet Take 1 tablet (4 mg total) by mouth every 6 (six) hours. 06/21/14   Hoyle Sauer, MD  oxyCODONE-acetaminophen (PERCOCET/ROXICET) 5-325 MG per tablet Take 1 tablet by mouth every 4 (four) hours as needed for moderate pain or severe pain. 06/21/14   Hoyle Sauer, MD  oxyCODONE-acetaminophen (PERCOCET/ROXICET) 5-325 MG per tablet Take 1 tablet by mouth every 8 (eight) hours as needed for moderate pain or severe pain. 07/13/14   Jamie Kato Forcucci, PA-C  pravastatin (PRAVACHOL) 40 MG tablet Take 40 mg by mouth every morning.     Historical Provider, MD  simvastatin (ZOCOR) 20 MG tablet Take 20 mg by mouth at bedtime.  07/19/13   Pixie Casino, MD  valsartan-hydrochlorothiazide (DIOVAN-HCT) 320-12.5 MG per tablet Take 1 tablet by mouth daily.    Historical Provider, MD   BP 136/89  Pulse 88  Temp(Src) 98.1 F (36.7 C) (Oral)  Resp 18  Ht 5\' 8"  (1.727 m)  Wt 267 lb (121.11 kg)  BMI 40.61 kg/m2  SpO2 98% Physical Exam  Nursing note and vitals reviewed. Constitutional: He is oriented to person, place, and time. He appears well-developed and well-nourished.  HENT:  Head: Normocephalic and atraumatic.  Mouth/Throat: Oropharynx is clear and moist. No oropharyngeal exudate.  Eyes: Conjunctivae and EOM are normal. Pupils are equal, round, and reactive to light. No scleral icterus.  Neck: Normal range of motion. Neck supple. No JVD present. No thyromegaly present.  Cardiovascular: Normal rate, regular rhythm and intact distal pulses.  Exam reveals no gallop and no friction rub.   Murmur heard. Pulmonary/Chest: Effort normal and breath sounds normal. No respiratory distress. He has no wheezes. He has no rales. He exhibits no tenderness.  Abdominal: Soft. Normal appearance and bowel sounds are normal. He exhibits no distension and no mass. There is no tenderness. There is no rebound and no guarding.  No pulsatile abdominal mass   Musculoskeletal:  Patient rises slowly from sitting to standing.  They walk with an antalgic gait to the right.  There is no evidence of erythema, ecchymosis, or gross deformity.  There is tenderness to palpation over The lumbar right paraspinal muscles and right buttock.  Active ROM is limited due to pain.  Sensation to light touch is intact over all extremities.  Strength is symmetric and equal in all extremities with only mild 4/5 strength deficit with right big toe extension.  DTRs are equal and symmetric in all extremities.      Mild tenderness to palpation over the right greater trochanter and right subtrochanteric bursa.   Lymphadenopathy:    He has no cervical adenopathy.  Neurological: He is alert and oriented to person, place, and time. He has normal strength. No cranial nerve deficit or sensory deficit. Coordination normal.  Skin: Skin is warm and dry.  Psychiatric: He has a normal mood and affect. His behavior is normal. Judgment and thought content normal.    ED Course  Procedures (including critical care time)  DIAGNOSTIC STUDIES: Oxygen Saturation is 98% on RA, normal by my interpretation.    COORDINATION OF CARE: 9:37 AM Discussed treatment plan with pt at bedside and pt agreed to plan.   Labs Review Labs Reviewed  I-STAT CHEM 8, ED - Abnormal; Notable for the following:    Sodium 135 (*)    Potassium 3.5 (*)    Glucose, Bld 108 (*)    Calcium, Ion 1.06 (*)    All other components within normal limits    Imaging Review Dg Lumbar Spine 2-3 Views  07/13/2014   CLINICAL DATA:  Low back and right hip pain after falling from a ladder yesterday.  EXAM: LUMBAR SPINE - 2-3 VIEW  COMPARISON:  Abdominal pelvic CT 06/21/2014.  FINDINGS: There are 5 lumbar type vertebral bodies. The alignment is normal. There is no evidence acute fracture or pars defect. Paraspinal osteophytes are noted, greatest at L3-4.  IMPRESSION: No acute osseous findings or malalignment. Stable paraspinal  osteophytes.   Electronically  Signed   By: Camie Patience M.D.   On: 07/13/2014 11:17   Dg Hip Complete Right  07/13/2014   CLINICAL DATA:  Low back and right hip pain after falling from a ladder yesterday.  EXAM: RIGHT HIP - COMPLETE 2+ VIEW  COMPARISON:  CT 01/19/2010.  FINDINGS: The mineralization and alignment are normal. There is no evidence of acute fracture or dislocation. There is no evidence of femoral head avascular necrosis. The hip and sacroiliac joint spaces are maintained. There is minimal calcific atherosclerosis of the right common femoral artery.  IMPRESSION: No acute osseous findings.   Electronically Signed   By: Camie Patience M.D.   On: 07/13/2014 11:19     EKG Interpretation None      MDM   Final diagnoses:  Right-sided low back pain with right-sided sciatica  Right hip pain   Patient is a 48 y.o. Male who presents to the ED with right hip and right sided low back pain.  History reveals no red flags symptoms.  Physical exam is without neurological abnormality at this time.  There is mild right toe dorsiflexion weakness.  This is likely consistent with the L5-S1 nerve root distribution.  Patient was treated here in the ED with percocet.  Given history of diabetes will not prescribe prednisone at this time.  I have checked an istat chem 8 to determine kidney function.  SCr is 0.80.  Plain film xrays of the hip and back reveal no acute abnormalities at this time.  Patient is stable for discharge at this time.  Patient will be sent home prescriptions for percocet 5/325, mobic 7.5 mg qdaily, and valium BID prn.  Patient is to follow-up with orthopedics for further treatment.  Patient was told to return for cauda equina symptoms and uncontrollable pain.  Patient states understanding and agreement to the above plan.    I personally performed the services described in this documentation, which was scribed in my presence. The recorded information has been reviewed and is  accurate.   Cherylann Parr, PA-C 07/13/14 [place] Forcucci, PA-C 07/13/14 1130

## 2014-07-13 NOTE — ED Notes (Signed)
Pt reports falling off ladder on Friday. Pt stated he was on the 4th step of ladder. Pt denies hitting head or LOC. Pt presents today with Pain to RT Back and RT leg. Pt has taken OTC for pain without relief.

## 2014-07-13 NOTE — ED Notes (Signed)
Declined W/C at D/C and was escorted to lobby by RN. 

## 2014-07-17 NOTE — ED Provider Notes (Signed)
Medical screening examination/treatment/procedure(s) were performed by non-physician practitioner and as supervising physician I was immediately available for consultation/collaboration.   EKG Interpretation None        Houston Siren III, MD 07/17/14 [phone]

## 2014-08-13 ENCOUNTER — Telehealth: Payer: Self-pay | Admitting: Internal Medicine

## 2014-08-14 NOTE — Telephone Encounter (Signed)
Close encounter 

## 2014-08-23 ENCOUNTER — Ambulatory Visit: Payer: BC Managed Care – PPO | Admitting: Internal Medicine

## 2014-09-18 ENCOUNTER — Emergency Department (HOSPITAL_COMMUNITY)
Admission: EM | Admit: 2014-09-18 | Discharge: 2014-09-18 | Disposition: A | Discharge status: Home / Self Care | Payer: BC Managed Care – PPO | Attending: Emergency Medicine | Admitting: Emergency Medicine

## 2014-09-18 ENCOUNTER — Encounter (HOSPITAL_COMMUNITY): Payer: Self-pay | Admitting: Emergency Medicine

## 2014-09-18 ENCOUNTER — Emergency Department (HOSPITAL_COMMUNITY): Payer: BC Managed Care – PPO

## 2014-09-18 DIAGNOSIS — Z720 Tobacco use: Secondary | ICD-10-CM | POA: Diagnosis not present

## 2014-09-18 DIAGNOSIS — K219 Gastro-esophageal reflux disease without esophagitis: Secondary | ICD-10-CM | POA: Diagnosis not present

## 2014-09-18 DIAGNOSIS — R51 Headache: Secondary | ICD-10-CM | POA: Diagnosis present

## 2014-09-18 DIAGNOSIS — E780 Pure hypercholesterolemia: Secondary | ICD-10-CM | POA: Diagnosis not present

## 2014-09-18 DIAGNOSIS — I2510 Atherosclerotic heart disease of native coronary artery without angina pectoris: Secondary | ICD-10-CM | POA: Insufficient documentation

## 2014-09-18 DIAGNOSIS — K029 Dental caries, unspecified: Secondary | ICD-10-CM | POA: Insufficient documentation

## 2014-09-18 DIAGNOSIS — Z79899 Other long term (current) drug therapy: Secondary | ICD-10-CM | POA: Insufficient documentation

## 2014-09-18 DIAGNOSIS — Z7902 Long term (current) use of antithrombotics/antiplatelets: Secondary | ICD-10-CM | POA: Insufficient documentation

## 2014-09-18 DIAGNOSIS — Z7982 Long term (current) use of aspirin: Secondary | ICD-10-CM | POA: Diagnosis not present

## 2014-09-18 DIAGNOSIS — Z8709 Personal history of other diseases of the respiratory system: Secondary | ICD-10-CM | POA: Diagnosis not present

## 2014-09-18 DIAGNOSIS — I252 Old myocardial infarction: Secondary | ICD-10-CM | POA: Insufficient documentation

## 2014-09-18 DIAGNOSIS — Z8701 Personal history of pneumonia (recurrent): Secondary | ICD-10-CM | POA: Diagnosis not present

## 2014-09-18 DIAGNOSIS — R519 Headache, unspecified: Secondary | ICD-10-CM

## 2014-09-18 DIAGNOSIS — E119 Type 2 diabetes mellitus without complications: Secondary | ICD-10-CM | POA: Insufficient documentation

## 2014-09-18 DIAGNOSIS — I10 Essential (primary) hypertension: Secondary | ICD-10-CM | POA: Diagnosis not present

## 2014-09-18 LAB — BASIC METABOLIC PANEL
Anion gap: 12 (ref 5–15)
BUN: 13 mg/dL (ref 6–23)
CALCIUM: 9 mg/dL (ref 8.4–10.5)
CO2: 27 meq/L (ref 19–32)
Chloride: 101 mEq/L (ref 96–112)
Creatinine, Ser: 0.83 mg/dL (ref 0.50–1.35)
GFR calc Af Amer: >90 mL/min (ref >90)
GFR calc non Af Amer: >90 mL/min (ref >90)
GLUCOSE: 116 mg/dL — AB (ref 70–99)
Potassium: 3.7 mEq/L (ref 3.7–5.3)
Sodium: 140 mEq/L (ref 137–147)

## 2014-09-18 LAB — CBC WITH DIFFERENTIAL/PLATELET
BASOS PCT: 0 % (ref 0–1)
Basophils Absolute: 0 10*3/uL (ref 0.0–0.1)
EOS PCT: 2 % (ref 0–5)
Eosinophils Absolute: 0.2 10*3/uL (ref 0.0–0.7)
HCT: 42 % (ref 39.0–52.0)
Hemoglobin: 14.3 g/dL (ref 13.0–17.0)
Lymphocytes Relative: 49 % — ABNORMAL HIGH (ref 12–46)
Lymphs Abs: 3.9 10*3/uL (ref 0.7–4.0)
MCH: 26.8 pg (ref 26.0–34.0)
MCHC: 34 g/dL (ref 30.0–36.0)
MCV: 78.8 fL (ref 78.0–100.0)
MONO ABS: 0.8 10*3/uL (ref 0.1–1.0)
Monocytes Relative: 10 % (ref 3–12)
Neutro Abs: 3.2 10*3/uL (ref 1.7–7.7)
Neutrophils Relative %: 39 % — ABNORMAL LOW (ref 43–77)
PLATELETS: 237 10*3/uL (ref 150–400)
RBC: 5.33 MIL/uL (ref 4.22–5.81)
RDW: 13.6 % (ref 11.5–15.5)
WBC: 8.1 10*3/uL (ref 4.0–10.5)

## 2014-09-18 MED ORDER — KETOROLAC TROMETHAMINE 30 MG/ML IJ SOLN
15.0000 mg | Freq: Once | INTRAMUSCULAR | Status: AC
  Administered 2014-09-18: 15 mg via INTRAVENOUS
  Filled 2014-09-18: qty 1

## 2014-09-18 MED ORDER — METHYLPREDNISOLONE SODIUM SUCC 125 MG IJ SOLR: 125.0000 mg | Freq: Once | INTRAMUSCULAR | Status: DC

## 2014-09-18 MED ORDER — AMOXICILLIN 500 MG PO CAPS: 500.0000 mg | ORAL_CAPSULE | Freq: Three times a day (TID) | ORAL | Status: DC

## 2014-09-18 MED ORDER — DIPHENHYDRAMINE HCL 50 MG/ML IJ SOLN
25.0000 mg | Freq: Once | INTRAMUSCULAR | Status: AC
  Administered 2014-09-18: 25 mg via INTRAVENOUS
  Filled 2014-09-18: qty 1

## 2014-09-18 MED ORDER — MORPHINE SULFATE 4 MG/ML IJ SOLN
4.0000 mg | Freq: Once | INTRAMUSCULAR | Status: AC
  Administered 2014-09-18: 4 mg via INTRAVENOUS
  Filled 2014-09-18: qty 1

## 2014-09-18 MED ORDER — OXYCODONE-ACETAMINOPHEN 5-325 MG PO TABS
ORAL_TABLET | ORAL | Status: DC
Start: 2014-09-18 — End: 2014-10-04

## 2014-09-18 MED ORDER — METOCLOPRAMIDE HCL 5 MG/ML IJ SOLN
10.0000 mg | Freq: Once | INTRAMUSCULAR | Status: AC
  Administered 2014-09-18: 10 mg via INTRAVENOUS
  Filled 2014-09-18: qty 2

## 2014-09-18 MED ORDER — SODIUM CHLORIDE 0.9 % IV BOLUS (SEPSIS)
1000.0000 mL | Freq: Once | INTRAVENOUS | Status: AC
  Administered 2014-09-18: 1000 mL via INTRAVENOUS

## 2014-09-18 NOTE — ED Provider Notes (Signed)
CSN: [id number]     Arrival date & time 09/18/14  1342 History   First MD Initiated Contact with Patient 09/18/14 1516     Chief Complaint  Patient presents with  . Headache     (Consider location/radiation/quality/duration/timing/severity/associated sxs/prior Treatment) HPI  Andrew West is a 48 y.o. male with past medical history significant for MI, non-insulin-dependent diabetes, daily tobacco smoker complaining of right temporal and parietal headache onset 9 days ago significantly worsening last night, described as throbbing and rated at 9 out of 10 not relieved by acetaminophen and naproxen. Denies anticoagulation takes full dose aspirin daily in addition to Plavix. Dates that this headache is atypical for him. Associated symptoms of blurred vision in the right eye, ataxia with no falls and nausea. Pt denies fever, rash, confusion, cervicalgia, LOC/syncope, change in vision, N/V, numbness, weakness, dysarthria,thunderclap onset, exacerbation with exertion, exacerbation in morning, CP, SOB, abdominal pain. on review of systems he notes that the pain is exacerbated by coughing, also notes right-sided pain from dental caries.    Past Medical History  Diagnosis Date  . Myocardial infarction     2002; treated with stent to mid L circumflex  . Heart disease   . Hypercholesteremia   . Coronary artery disease   . NSTEMI (non-ST elevated myocardial infarction) 10/18/2011  . CAD (coronary artery disease), native coronary artery 04/18/2008  . Diabetes mellitus     Takes Metformin  . Hypertension     Takes Lisinopril   . GERD (gastroesophageal reflux disease)     Takes Dexilant  . Angina     Takes Isosorbide & Nitroglycerin  . Headache(784.0)   . Pneumonia     approx 2 years ago  . Bronchitis     hx of  . Shortness of breath     Occurs while laying down at times  . History of nuclear stress test 07/29/2011    decreased activity in septum on stress images compared with rest; wall  motion abnormality, EF 53%  . Rhabdomyolysis     h/o, r/t statins  . H/O cardiac catheterization     with 5 separate dated interventions   Past Surgical History  Procedure Laterality Date  . Coronary angioplasty with stent placement  10/09/2001    PTCA & stenting of mid AV circumflex; 2.5x72mm Pixel stent  . Colonoscopy w/ polypectomy    . Lumbar laminectomy/decompression microdiscectomy  03/31/2012    Procedure: LUMBAR LAMINECTOMY/DECOMPRESSION MICRODISCECTOMY 1 LEVEL;  Surgeon: Charlie Pitter, MD;  Location: [facility];  Service: Neurosurgery;  Laterality: Left;  . Transthoracic echocardiogram  07/28/2011    EF 55-65%; LVH, grade 1 diastolic dysfunction;   . Coronary angioplasty with stent placement  12/13/2001    PCI with stent to mid L circumflex, 95% stenosis to 0% residual  . Coronary angioplasty with stent placement  10/10/2003    PCI to mid AV circumflex; LAD 30% disease; RCA 100% occluded prox.  . Coronary angioplasty with stent placement  09/01/2011    PCI with stenting with bare metal stent to mid AV groove circumflex and PDA  . Coronary angioplasty with stent placement  10/17/2011    cutting balloon angioplasty of ostial lateral OM1 branch and bifurcation AV groove circumflex OM junction; stenosis reduced to 0%   Family History  Problem Relation Age of Onset  . Anesthesia problems Neg Hx   . Hypotension Neg Hx   . Malignant hyperthermia Neg Hx   . Pseudochol deficiency Neg Hx   .  Hypertension Mother   . Heart attack Father   . Diabetes Mother   . Heart disease Brother     x 3   . Heart attack Brother     deceased  . Hypertension Sister   . Diabetes Sister    History  Substance Use Topics  . Smoking status: Current Some Day Smoker -- 25 years    Types: Cigarettes  . Smokeless tobacco: Never Used     Comment: 1 pack lasts about 2 weeks  . Alcohol Use: No    Review of Systems  10 systems reviewed and found to be negative, except as noted in the  HPI.   Allergies  Iohexol and Vicodin  Home Medications   Prior to Admission medications   Medication Sig Start Date End Date Taking? Authorizing Provider  acetaminophen (TYLENOL) 500 MG tablet Take 1,000 mg by mouth every 6 (six) hours as needed for headache.   Yes Historical Provider, MD  amLODipine (NORVASC) 10 MG tablet Take 1 tablet (10 mg total) by mouth daily. 10/04/13  Yes Gregor Hams, MD  aspirin 325 MG tablet Take 325 mg by mouth daily.   Yes Historical Provider, MD  clopidogrel (PLAVIX) 75 MG tablet Take 75 mg by mouth daily.   Yes Historical Provider, MD  dexlansoprazole (DEXILANT) 60 MG capsule Take 60 mg by mouth daily.   Yes Historical Provider, MD  HYDROcodone-acetaminophen (NORCO/VICODIN) 5-325 MG per tablet Take 1 tablet by mouth every 4 (four) hours as needed. 12/21/13  Yes Joaquim Lai C. Sanford, PA-C  ibuprofen (ADVIL,MOTRIN) 200 MG tablet Take 400 mg by mouth every 6 (six) hours as needed for headache.   Yes Historical Provider, MD  isosorbide mononitrate (IMDUR) 30 MG 24 hr tablet Take 30 mg by mouth 2 (two) times daily.   Yes Historical Provider, MD  lisinopril-hydrochlorothiazide (PRINZIDE,ZESTORETIC) 20-12.5 MG per tablet Take 0.5 tablets by mouth daily.    Yes Historical Provider, MD  meloxicam (MOBIC) 15 MG tablet Take 1 tablet (15 mg total) by mouth daily. 08/14/13  Yes Harden Mo, MD  metFORMIN (GLUCOPHAGE) 1000 MG tablet Take 1,000 mg by mouth 2 (two) times daily with a meal. Restart this medication on Thursday, Oct 21, 2011. 10/19/11  Yes Brett Canales, PA-C  methocarbamol (ROBAXIN) 500 MG tablet Take 1 tablet (500 mg total) by mouth 2 (two) times daily. 01/23/14  Yes Hannah Muthersbaugh, PA-C  metoprolol (LOPRESSOR) 50 MG tablet Take 1 tablet (50 mg total) by mouth 2 (two) times daily. 09/07/13  Yes Pixie Casino, MD  nitroGLYCERIN (NITROSTAT) 0.4 MG SL tablet Place 1 tablet (0.4 mg total) under the tongue every 5 (five) minutes as needed for chest pain.  09/07/13  Yes Pixie Casino, MD  oxyCODONE-acetaminophen (PERCOCET/ROXICET) 5-325 MG per tablet Take 1 tablet by mouth every 8 (eight) hours as needed for moderate pain or severe pain. 07/13/14  Yes Courtney A Forcucci, PA-C  pravastatin (PRAVACHOL) 40 MG tablet Take 40 mg by mouth every morning.    Yes Historical Provider, MD  simvastatin (ZOCOR) 20 MG tablet Take 20 mg by mouth at bedtime.  07/19/13  Yes Pixie Casino, MD  valsartan-hydrochlorothiazide (DIOVAN-HCT) 320-12.5 MG per tablet Take 1 tablet by mouth daily.   Yes Historical Provider, MD  amoxicillin (AMOXIL) 500 MG capsule Take 1 capsule (500 mg total) by mouth 3 (three) times daily. 09/18/14   Chelby Salata, PA-C  oxyCODONE-acetaminophen (PERCOCET/ROXICET) 5-325 MG per tablet 1 to 2 tabs PO q6hrs  PRN for pain 09/18/14   Aaralynn Shepheard, PA-C   BP 127/80  Pulse 78  Temp(Src) 98.6 F (37 C)  Resp 25  SpO2 89% Physical Exam  Nursing note and vitals reviewed. Constitutional: He is oriented to person, place, and time. He appears well-developed and well-nourished. No distress.  HENT:  Head: Normocephalic and atraumatic.  Right Ear: External ear normal.  Left Ear: External ear normal.  Mouth/Throat: Oropharynx is clear and moist.  Multiple dental caries eroding into the gumline worse on the right side than the left   Bilateral tympanic membranes with normal architecture and good light reflex.   Temporal arteries are not indurated and there is no tenderness to palpation  Eyes: Conjunctivae, EOM and lids are normal. Pupils are equal, round, and reactive to light. Right conjunctiva is not injected. Right conjunctiva has no hemorrhage. Left conjunctiva is not injected. Left conjunctiva has no hemorrhage. No scleral icterus. Right eye exhibits normal extraocular motion and no nystagmus. Left eye exhibits normal extraocular motion and no nystagmus.  Neck:  No midline C-spine  tenderness to palpation or step-offs appreciated.  Patient has full range of motion without pain.   Cardiovascular: Normal rate, regular rhythm and intact distal pulses.   Pulmonary/Chest: Effort normal and breath sounds normal. No stridor. No respiratory distress. He has no wheezes. He has no rales. He exhibits no tenderness.  Abdominal: Soft. There is no tenderness. There is no rebound and no guarding.  Musculoskeletal: Normal range of motion. He exhibits no edema and no tenderness.  Neurological: He is alert and oriented to person, place, and time. No cranial nerve deficit. Coordination normal.  II-Visual fields grossly intact. III/IV/VI-Extraocular movements intact.  Pupils reactive bilaterally. V/VII-Smile symmetric, equal eyebrow raise,  facial sensation intact VIII- Hearing grossly intact IX/X-Normal gag XI-bilateral shoulder shrug XII-midline tongue extension Motor: 5/5 bilaterally with normal tone and bulk Cerebellar: Normal finger-to-nose  and normal heel-to-shin test.   Romberg negative Ambulates with a coordinated gait   Psychiatric: He has a normal mood and affect.    ED Course  Procedures (including critical care time) Labs Review Labs Reviewed  CBC WITH DIFFERENTIAL - Abnormal; Notable for the following:    Neutrophils Relative % 39 (*)    Lymphocytes Relative 49 (*)    All other components within normal limits  BASIC METABOLIC PANEL - Abnormal; Notable for the following:    Glucose, Bld 116 (*)    All other components within normal limits    Imaging Review Ct Head Wo Contrast  09/18/2014   CLINICAL DATA:  Headaches for 9 days  EXAM: CT HEAD WITHOUT CONTRAST  TECHNIQUE: Contiguous axial images were obtained from the base of the skull through the vertex without intravenous contrast.  COMPARISON:  None.  FINDINGS: There is no evidence of mass effect, midline shift or extra-axial fluid collections. There is no evidence of a space-occupying lesion or intracranial hemorrhage. There is no evidence of a cortical-based  area of acute infarction.  The ventricles and sulci are appropriate for the patient's age. The basal cisterns are patent.  Visualized portions of the orbits are unremarkable. The visualized portions of the paranasal sinuses and mastoid air cells are unremarkable.  The osseous structures are unremarkable. There is soft tissue thickening in the suboccipital subcutaneous fat with overlying skin thickening which may reflect scarring, but is unchanged compared with 06/02/2010.  IMPRESSION: 1. Normal CT of the brain.   Electronically Signed   By: Kathreen Devoid   On: 09/18/2014  16:53     EKG Interpretation None      MDM   Final diagnoses:  Headache  Dental caries    Filed Vitals:   09/18/14 1355 09/18/14 1515 09/18/14 1600 09/18/14 1700  BP: 144/91 135/87 142/89 127/80  Pulse: 90 90 92 78  Temp: 98.6 F (37 C)     Resp: 18 19 15 25   SpO2: 96% 93% 95% 89%    Medications  metoCLOPramide (REGLAN) injection 10 mg (10 mg Intravenous Given 09/18/14 1557)  diphenhydrAMINE (BENADRYL) injection 25 mg (25 mg Intravenous Given 09/18/14 1557)  sodium chloride 0.9 % bolus 1,000 mL (0 mLs Intravenous Stopped 09/18/14 1654)  morphine 4 MG/ML injection 4 mg (4 mg Intravenous Given 09/18/14 1708)  ketorolac (TORADOL) 30 MG/ML injection 15 mg (15 mg Intravenous Given 09/18/14 1708)    Asia Favata Osier is a 48 y.o. male presenting with  atypical right sided headache associated with right blurred vision, subjective ataxia. Neuro exam is nonfocal. Plan his headache cocktail, CT head for red flags of exacerbation by Valsalva and atypical nature of headache. Pupils are equal and reactive, no conjunctival injection, no temporal artery tenderness or induration.   CT head is negative,Visual acuity without abnormality. I will send the patient home with neurology referral and will start treating him for dental pain with Percocet and amoxicillin. Patient is saving money to have the majority of his teeth removed by  oral surgery.   Discussed case with attending MD who agrees with plan and stability to d/c to home.   Evaluation does not show pathology that would require ongoing emergent intervention or inpatient treatment. Pt is hemodynamically stable and mentating appropriately. Discussed findings and plan with patient/guardian, who agrees with care plan. All questions answered. Return precautions discussed and outpatient follow up given.   New Prescriptions   AMOXICILLIN (AMOXIL) 500 MG CAPSULE    Take 1 capsule (500 mg total) by mouth 3 (three) times daily.   OXYCODONE-ACETAMINOPHEN (PERCOCET/ROXICET) 5-325 MG PER TABLET    1 to 2 tabs PO q6hrs  PRN for pain         Monico Blitz, PA-C 09/18/14 1900

## 2014-09-18 NOTE — ED Notes (Signed)
Per pt sts HA x 9 days and worsening today. sts taking medications without relief. sts vision problems and nausea.

## 2014-09-18 NOTE — ED Notes (Signed)
To ct

## 2014-09-18 NOTE — ED Notes (Signed)
Returned from ct 

## 2014-09-18 NOTE — Discharge Instructions (Signed)
Take percocet for breakthrough pain, do not drink alcohol, drive, care for children or do other critical tasks while taking percocet. ° °Please follow with your primary care doctor in the next 2 days for a check-up. They must obtain records for further management.  ° °Do not hesitate to return to the Emergency Department for any new, worsening or concerning symptoms.  ° °

## 2014-09-19 NOTE — ED Provider Notes (Signed)
Medical screening examination/treatment/procedure(s) were performed by non-physician practitioner and as supervising physician I was immediately available for consultation/collaboration.   EKG Interpretation None        Hoy Morn, MD 09/19/14 [phone]

## 2014-10-04 ENCOUNTER — Emergency Department (HOSPITAL_COMMUNITY)
Admission: EM | Admit: 2014-10-04 | Discharge: 2014-10-04 | Disposition: A | Discharge status: Home / Self Care | Payer: BC Managed Care – PPO | Attending: Emergency Medicine | Admitting: Emergency Medicine

## 2014-10-04 ENCOUNTER — Encounter (HOSPITAL_COMMUNITY): Payer: Self-pay | Admitting: Emergency Medicine

## 2014-10-04 DIAGNOSIS — I10 Essential (primary) hypertension: Secondary | ICD-10-CM | POA: Insufficient documentation

## 2014-10-04 DIAGNOSIS — Z9889 Other specified postprocedural states: Secondary | ICD-10-CM | POA: Diagnosis not present

## 2014-10-04 DIAGNOSIS — Z8701 Personal history of pneumonia (recurrent): Secondary | ICD-10-CM | POA: Diagnosis not present

## 2014-10-04 DIAGNOSIS — S3992XA Unspecified injury of lower back, initial encounter: Secondary | ICD-10-CM | POA: Diagnosis present

## 2014-10-04 DIAGNOSIS — Z9861 Coronary angioplasty status: Secondary | ICD-10-CM | POA: Diagnosis not present

## 2014-10-04 DIAGNOSIS — I2510 Atherosclerotic heart disease of native coronary artery without angina pectoris: Secondary | ICD-10-CM | POA: Diagnosis not present

## 2014-10-04 DIAGNOSIS — K219 Gastro-esophageal reflux disease without esophagitis: Secondary | ICD-10-CM | POA: Insufficient documentation

## 2014-10-04 DIAGNOSIS — Z79899 Other long term (current) drug therapy: Secondary | ICD-10-CM | POA: Insufficient documentation

## 2014-10-04 DIAGNOSIS — E119 Type 2 diabetes mellitus without complications: Secondary | ICD-10-CM | POA: Insufficient documentation

## 2014-10-04 DIAGNOSIS — I252 Old myocardial infarction: Secondary | ICD-10-CM | POA: Insufficient documentation

## 2014-10-04 DIAGNOSIS — Z792 Long term (current) use of antibiotics: Secondary | ICD-10-CM | POA: Insufficient documentation

## 2014-10-04 DIAGNOSIS — Y9289 Other specified places as the place of occurrence of the external cause: Secondary | ICD-10-CM | POA: Diagnosis not present

## 2014-10-04 DIAGNOSIS — Y990 Civilian activity done for income or pay: Secondary | ICD-10-CM | POA: Diagnosis not present

## 2014-10-04 DIAGNOSIS — Z8709 Personal history of other diseases of the respiratory system: Secondary | ICD-10-CM | POA: Insufficient documentation

## 2014-10-04 DIAGNOSIS — E780 Pure hypercholesterolemia: Secondary | ICD-10-CM | POA: Insufficient documentation

## 2014-10-04 DIAGNOSIS — Y9389 Activity, other specified: Secondary | ICD-10-CM | POA: Insufficient documentation

## 2014-10-04 DIAGNOSIS — Z7982 Long term (current) use of aspirin: Secondary | ICD-10-CM | POA: Diagnosis not present

## 2014-10-04 DIAGNOSIS — M549 Dorsalgia, unspecified: Secondary | ICD-10-CM

## 2014-10-04 DIAGNOSIS — G8929 Other chronic pain: Secondary | ICD-10-CM | POA: Diagnosis not present

## 2014-10-04 DIAGNOSIS — Z720 Tobacco use: Secondary | ICD-10-CM | POA: Diagnosis not present

## 2014-10-04 DIAGNOSIS — X58XXXA Exposure to other specified factors, initial encounter: Secondary | ICD-10-CM | POA: Diagnosis not present

## 2014-10-04 DIAGNOSIS — Z7902 Long term (current) use of antithrombotics/antiplatelets: Secondary | ICD-10-CM | POA: Insufficient documentation

## 2014-10-04 MED ORDER — OXYCODONE-ACETAMINOPHEN 5-325 MG PO TABS
ORAL_TABLET | ORAL | Status: DC
Start: 2014-10-04 — End: 2014-10-25

## 2014-10-04 MED ORDER — METHOCARBAMOL 750 MG PO TABS: 750.0000 mg | ORAL_TABLET | Freq: Two times a day (BID) | ORAL | Status: DC

## 2014-10-04 MED ORDER — MELOXICAM 15 MG PO TABS: 15.0000 mg | ORAL_TABLET | Freq: Every day | ORAL | Status: DC

## 2014-10-04 NOTE — ED Notes (Signed)
Patient states he hurt his back while lifting a table this morning at work.  Patient states "something popped".   Patient states he has chronic back pain , but this made it worse.    Patient states bulging disc surgery in 2013.

## 2014-10-04 NOTE — ED Provider Notes (Signed)
CSN: [id number]     Arrival date & time 10/04/14  0725 History   First MD Initiated Contact with Patient 10/04/14 [phone]     Chief Complaint  Patient presents with  . Back Pain     (Consider location/radiation/quality/duration/timing/severity/associated sxs/prior Treatment) HPI   Patient with hx chronic back pain with lumbar surgery two years ago presents with sudden increase in low back pain.  Pt typically has 1/10 pain on a daily basis.  Today he lifted a table at work and felt a pop in his lower back.  The pain is 5.5/10, aching, constant, no radiation.  Denies fevers, chills, abdominal pain, loss of control of bowel or bladder, weakness of numbness of the extremities, saddle anesthesia, bowel or urinary complaints.     Past Medical History  Diagnosis Date  . Myocardial infarction     2002; treated with stent to mid L circumflex  . Heart disease   . Hypercholesteremia   . Coronary artery disease   . NSTEMI (non-ST elevated myocardial infarction) 10/18/2011  . CAD (coronary artery disease), native coronary artery 04/18/2008  . Diabetes mellitus     Takes Metformin  . Hypertension     Takes Lisinopril   . GERD (gastroesophageal reflux disease)     Takes Dexilant  . Angina     Takes Isosorbide & Nitroglycerin  . Headache(784.0)   . Pneumonia     approx 2 years ago  . Bronchitis     hx of  . Shortness of breath     Occurs while laying down at times  . History of nuclear stress test 07/29/2011    decreased activity in septum on stress images compared with rest; wall motion abnormality, EF 53%  . Rhabdomyolysis     h/o, r/t statins  . H/O cardiac catheterization     with 5 separate dated interventions   Past Surgical History  Procedure Laterality Date  . Coronary angioplasty with stent placement  10/09/2001    PTCA & stenting of mid AV circumflex; 2.5x56mm Pixel stent  . Colonoscopy w/ polypectomy    . Lumbar laminectomy/decompression microdiscectomy  03/31/2012   Procedure: LUMBAR LAMINECTOMY/DECOMPRESSION MICRODISCECTOMY 1 LEVEL;  Surgeon: Charlie Pitter, MD;  Location: [facility];  Service: Neurosurgery;  Laterality: Left;  . Transthoracic echocardiogram  07/28/2011    EF 55-65%; LVH, grade 1 diastolic dysfunction;   . Coronary angioplasty with stent placement  12/13/2001    PCI with stent to mid L circumflex, 95% stenosis to 0% residual  . Coronary angioplasty with stent placement  10/10/2003    PCI to mid AV circumflex; LAD 30% disease; RCA 100% occluded prox.  . Coronary angioplasty with stent placement  09/01/2011    PCI with stenting with bare metal stent to mid AV groove circumflex and PDA  . Coronary angioplasty with stent placement  10/17/2011    cutting balloon angioplasty of ostial lateral OM1 branch and bifurcation AV groove circumflex OM junction; stenosis reduced to 0%   Family History  Problem Relation Age of Onset  . Anesthesia problems Neg Hx   . Hypotension Neg Hx   . Malignant hyperthermia Neg Hx   . Pseudochol deficiency Neg Hx   . Hypertension Mother   . Heart attack Father   . Diabetes Mother   . Heart disease Brother     x 3   . Heart attack Brother     deceased  . Hypertension Sister   . Diabetes Sister    History  Substance Use Topics  . Smoking status: Current Some Day Smoker -- 25 years    Types: Cigarettes  . Smokeless tobacco: Never Used     Comment: 1 pack lasts about 2 weeks  . Alcohol Use: No    Review of Systems  All other systems reviewed and are negative.     Allergies  Iohexol and Vicodin  Home Medications   Prior to Admission medications   Medication Sig Start Date End Date Taking? Authorizing Provider  acetaminophen (TYLENOL) 500 MG tablet Take 1,000 mg by mouth every 6 (six) hours as needed for headache.    Historical Provider, MD  amLODipine (NORVASC) 10 MG tablet Take 1 tablet (10 mg total) by mouth daily. 10/04/13   Gregor Hams, MD  amoxicillin (AMOXIL) 500 MG capsule Take 1 capsule  (500 mg total) by mouth 3 (three) times daily. 09/18/14   Nicole Pisciotta, PA-C  aspirin 325 MG tablet Take 325 mg by mouth daily.    Historical Provider, MD  clopidogrel (PLAVIX) 75 MG tablet Take 75 mg by mouth daily.    Historical Provider, MD  dexlansoprazole (DEXILANT) 60 MG capsule Take 60 mg by mouth daily.    Historical Provider, MD  HYDROcodone-acetaminophen (NORCO/VICODIN) 5-325 MG per tablet Take 1 tablet by mouth every 4 (four) hours as needed. 12/21/13   Idalia Needle. Sanford, PA-C  ibuprofen (ADVIL,MOTRIN) 200 MG tablet Take 400 mg by mouth every 6 (six) hours as needed for headache.    Historical Provider, MD  isosorbide mononitrate (IMDUR) 30 MG 24 hr tablet Take 30 mg by mouth 2 (two) times daily.    Historical Provider, MD  lisinopril-hydrochlorothiazide (PRINZIDE,ZESTORETIC) 20-12.5 MG per tablet Take 0.5 tablets by mouth daily.     Historical Provider, MD  meloxicam (MOBIC) 15 MG tablet Take 1 tablet (15 mg total) by mouth daily. 10/04/14   Clayton Bibles, PA-C  metFORMIN (GLUCOPHAGE) 1000 MG tablet Take 1,000 mg by mouth 2 (two) times daily with a meal. Restart this medication on Thursday, Oct 21, 2011. 10/19/11   Brett Canales, PA-C  methocarbamol (ROBAXIN) 750 MG tablet Take 1 tablet (750 mg total) by mouth 2 (two) times daily. 10/04/14   Clayton Bibles, PA-C  metoprolol (LOPRESSOR) 50 MG tablet Take 1 tablet (50 mg total) by mouth 2 (two) times daily. 09/07/13   Pixie Casino, MD  nitroGLYCERIN (NITROSTAT) 0.4 MG SL tablet Place 1 tablet (0.4 mg total) under the tongue every 5 (five) minutes as needed for chest pain. 09/07/13   Pixie Casino, MD  oxyCODONE-acetaminophen (PERCOCET/ROXICET) 5-325 MG per tablet 1 to 2 tabs PO q6hrs  PRN for pain 10/04/14   Clayton Bibles, PA-C  pravastatin (PRAVACHOL) 40 MG tablet Take 40 mg by mouth every morning.     Historical Provider, MD  simvastatin (ZOCOR) 20 MG tablet Take 20 mg by mouth at bedtime.  07/19/13   Pixie Casino, MD    valsartan-hydrochlorothiazide (DIOVAN-HCT) 320-12.5 MG per tablet Take 1 tablet by mouth daily.    Historical Provider, MD   BP 134/85 mmHg  Pulse 88  Temp(Src) 98.1 F (36.7 C) (Oral)  Resp 20  SpO2 98% Physical Exam  Constitutional: He appears well-developed and well-nourished. No distress.  HENT:  Head: Normocephalic and atraumatic.  Neck: Neck supple.  Pulmonary/Chest: Effort normal.  Abdominal: Soft. He exhibits no distension. There is no tenderness. There is no rebound and no guarding.  Musculoskeletal:       Back:  Diffuse tenderness  over lumbar spine, no focal tenderness.  No crepitus, no stepoffs.    Lower extremities:  Strength 5/5, sensation intact, distal pulses intact.     Neurological: He is alert.  Skin: He is not diaphoretic.  Nursing note and vitals reviewed.   ED Course  Procedures (including critical care time) Labs Review Labs Reviewed - No data to display  Imaging Review No results found.   EKG Interpretation None       Pt checked on [place]A database.  Multiple prescriptions for percocet, mostly from PCP Dr Iona Beard with several ED prescriptions from previous visits.  Pt states he has one percocet at home currently.  Last Rx #15 percocet, 09/18/14 from ED.   MDM   Final diagnoses:  Exacerbation of chronic back pain    Afebrile, nontoxic patient with exacerbation of chronic back pain.  No red flags with history or exam.  Neurovascularly intact.  Emergent imaging not indicated at this time.   D/C home with #10 percocet, robaxin, mobic.  PCP and neurosurgery follow up (Dr Annette Stable did prior surgery, will f/u with him).  Discussed result, findings, treatment, and follow up  with patient.  Pt given return precautions.  Pt verbalizes understanding and agrees with plan.         Clayton Bibles, PA-C 10/04/14 [place], MD 10/04/14 [phone]

## 2014-10-04 NOTE — Discharge Instructions (Signed)
Read the information below.  Use the prescribed medication as directed.  Please discuss all new medications with your pharmacist.  Do not take additional tylenol while taking the prescribed pain medication to avoid overdose.  You may return to the Emergency Department at any time for worsening condition or any new symptoms that concern you.   If you develop fevers, loss of control of bowel or bladder, weakness or numbness in your legs, or are unable to walk, return to the ER for a recheck.    Back Injury Prevention Back injuries can be extremely painful and difficult to heal. After having one back injury, you are much more likely to experience another later on. It is important to learn how to avoid injuring or re-injuring your back. The following tips can help you to prevent a back injury. PHYSICAL FITNESS  Exercise regularly and try to develop good tone in your abdominal muscles. Your abdominal muscles provide a lot of the support needed by your back.  Do aerobic exercises (walking, jogging, biking, swimming) regularly.  Do exercises that increase balance and strength (tai chi, yoga) regularly. This can decrease your risk of falling and injuring your back.  Stretch before and after exercising.  Maintain a healthy weight. The more you weigh, the more stress is placed on your back. For every pound of weight, 10 times that amount of pressure is placed on the back. DIET  Talk to your caregiver about how much calcium and vitamin D you need per day. These nutrients help to prevent weakening of the bones (osteoporosis). Osteoporosis can cause broken (fractured) bones that lead to back pain.  Include good sources of calcium in your diet, such as dairy products, green, leafy vegetables, and products with calcium added (fortified).  Include good sources of vitamin D in your diet, such as milk and foods that are fortified with vitamin D.  Consider taking a nutritional supplement or a multivitamin if  needed.  Stop smoking if you smoke. POSTURE  Sit and stand up straight. Avoid leaning forward when you sit or hunching over when you stand.  Choose chairs with good low back (lumbar) support.  If you work at a desk, sit close to your work so you do not need to lean over. Keep your chin tucked in. Keep your neck drawn back and elbows bent at a right angle. Your arms should look like the letter "L."  Sit high and close to the steering wheel when you drive. Add a lumbar support to your car seat if needed.  Avoid sitting or standing in one position for too long. Take breaks to get up, stretch, and walk around at least once every hour. Take breaks if you are driving for long periods of time.  Sleep on your side with your knees slightly bent, or sleep on your back with a pillow under your knees. Do not sleep on your stomach. LIFTING, TWISTING, AND REACHING  Avoid heavy lifting, especially repetitive lifting. If you must do heavy lifting:  Stretch before lifting.  Work slowly.  Rest between lifts.  Use carts and dollies to move objects when possible.  Make several small trips instead of carrying 1 heavy load.  Ask for help when you need it.  Ask for help when moving big, awkward objects.  Follow these steps when lifting:  Stand with your feet shoulder-width apart.  Get as close to the object as you can. Do not try to pick up heavy objects that are far from your  body.  Use handles or lifting straps if they are available.  Bend at your knees. Squat down, but keep your heels off the floor.  Keep your shoulders pulled back, your chin tucked in, and your back straight.  Lift the object slowly, tightening the muscles in your legs, abdomen, and buttocks. Keep the object as close to the center of your body as possible.  When you put a load down, use these same guidelines in reverse.  Do not:  Lift the object above your waist.  Twist at the waist while lifting or carrying a  load. Move your feet if you need to turn, not your waist.  Bend over without bending at your knees.  Avoid reaching over your head, across a table, or for an object on a high surface. OTHER TIPS  Avoid wet floors and keep sidewalks clear of ice to prevent falls.  Do not sleep on a mattress that is too soft or too hard.  Keep items that are used frequently within easy reach.  Put heavier objects on shelves at waist level and lighter objects on lower or higher shelves.  Find ways to decrease your stress, such as exercise, massage, or relaxation techniques. Stress can build up in your muscles. Tense muscles are more vulnerable to injury.  Seek treatment for depression or anxiety if needed. These conditions can increase your risk of developing back pain. SEEK MEDICAL CARE IF:  You injure your back.  You have questions about diet, exercise, or other ways to prevent back injuries. MAKE SURE YOU:  Understand these instructions.  Will watch your condition.  Will get help right away if you are not doing well or get worse. Document Released: 12/16/2004 Document Revised: 01/31/2012 Document Reviewed: 12/20/2011 [facility] Patient Information 2015 [place], [place]. This information is not intended to replace advice given to you by your health care provider. Make sure you discuss any questions you have with your health care provider.  Back Pain, Adult Low back pain is very common. About 1 in 5 people have back pain.The cause of low back pain is rarely dangerous. The pain often gets better over time.About half of people with a sudden onset of back pain feel better in just 2 weeks. About 8 in 10 people feel better by 6 weeks.  CAUSES Some common causes of back pain include:  Strain of the muscles or ligaments supporting the spine.  Wear and tear (degeneration) of the spinal discs.  Arthritis.  Direct injury to the back. DIAGNOSIS Most of the time, the direct cause of low back pain is  not known.However, back pain can be treated effectively even when the exact cause of the pain is unknown.Answering your caregiver's questions about your overall health and symptoms is one of the most accurate ways to make sure the cause of your pain is not dangerous. If your caregiver needs more information, he or she may order lab work or imaging tests (X-rays or MRIs).However, even if imaging tests show changes in your back, this usually does not require surgery. HOME CARE INSTRUCTIONS For many people, back pain returns.Since low back pain is rarely dangerous, it is often a condition that people can learn to [facility] their own.   Remain active. It is stressful on the back to sit or stand in one place. Do not sit, drive, or stand in one place for more than 30 minutes at a time. Take short walks on level surfaces as soon as pain allows.Try to increase the length of  time you walk each day.  Do not stay in bed.Resting more than 1 or 2 days can delay your recovery.  Do not avoid exercise or work.Your body is made to move.It is not dangerous to be active, even though your back may hurt.Your back will likely heal faster if you return to being active before your pain is gone.  Pay attention to your body when you bend and lift. Many people have less discomfortwhen lifting if they bend their knees, keep the load close to their bodies,and avoid twisting. Often, the most comfortable positions are those that put less stress on your recovering back.  Find a comfortable position to sleep. Use a firm mattress and lie on your side with your knees slightly bent. If you lie on your back, put a pillow under your knees.  Only take over-the-counter or prescription medicines as directed by your caregiver. Over-the-counter medicines to reduce pain and inflammation are often the most helpful.Your caregiver may prescribe muscle relaxant drugs.These medicines help dull your pain so you can more quickly return to  your normal activities and healthy exercise.  Put ice on the injured area.  Put ice in a plastic bag.  Place a towel between your skin and the bag.  Leave the ice on for 15-20 minutes, 03-04 times a day for the first 2 to 3 days. After that, ice and heat may be alternated to reduce pain and spasms.  Ask your caregiver about trying back exercises and gentle massage. This may be of some benefit.  Avoid feeling anxious or stressed.Stress increases muscle tension and can worsen back pain.It is important to recognize when you are anxious or stressed and learn ways to manage it.Exercise is a great option. SEEK MEDICAL CARE IF:  You have pain that is not relieved with rest or medicine.  You have pain that does not improve in 1 week.  You have new symptoms.  You are generally not feeling well. SEEK IMMEDIATE MEDICAL CARE IF:   You have pain that radiates from your back into your legs.  You develop new bowel or bladder control problems.  You have unusual weakness or numbness in your arms or legs.  You develop nausea or vomiting.  You develop abdominal pain.  You feel faint. Document Released: 11/08/2005 Document Revised: 05/09/2012 Document Reviewed: 03/12/2014 [facility] Patient Information 2015 [place], [place]. This information is not intended to replace advice given to you by your health care provider. Make sure you discuss any questions you have with your health care provider.  Chronic Back Pain  When back pain lasts longer than 3 months, it is called chronic back pain.People with chronic back pain often go through certain periods that are more intense (flare-ups).  CAUSES Chronic back pain can be caused by wear and tear (degeneration) on different structures in your back. These structures include:  The bones of your spine (vertebrae) and the joints surrounding your spinal cord and nerve roots (facets).  The strong, fibrous tissues that connect your vertebrae  (ligaments). Degeneration of these structures may result in pressure on your nerves. This can lead to constant pain. HOME CARE INSTRUCTIONS  Avoid bending, heavy lifting, prolonged sitting, and activities which make the problem worse.  Take brief periods of rest throughout the day to reduce your pain. Lying down or standing usually is better than sitting while you are resting.  Take over-the-counter or prescription medicines only as directed by your caregiver. SEEK IMMEDIATE MEDICAL CARE IF:   You have weakness or  numbness in one of your legs or feet.  You have trouble controlling your bladder or bowels.  You have nausea, vomiting, abdominal pain, shortness of breath, or fainting. Document Released: 12/16/2004 Document Revised: 01/31/2012 Document Reviewed: 10/23/2011 [facility] Patient Information 2015  [place], [place]. This information is not intended to replace advice given to you by your health care provider. Make sure you discuss any questions you have with your health care provider.

## 2014-10-12 ENCOUNTER — Encounter (HOSPITAL_COMMUNITY): Payer: Self-pay | Admitting: Emergency Medicine

## 2014-10-12 ENCOUNTER — Emergency Department (HOSPITAL_COMMUNITY)
Admission: EM | Admit: 2014-10-12 | Discharge: 2014-10-12 | Disposition: A | Discharge status: Home / Self Care | Payer: BC Managed Care – PPO | Attending: Emergency Medicine | Admitting: Emergency Medicine

## 2014-10-12 DIAGNOSIS — M545 Low back pain, unspecified: Secondary | ICD-10-CM

## 2014-10-12 DIAGNOSIS — E119 Type 2 diabetes mellitus without complications: Secondary | ICD-10-CM | POA: Insufficient documentation

## 2014-10-12 DIAGNOSIS — E780 Pure hypercholesterolemia: Secondary | ICD-10-CM | POA: Insufficient documentation

## 2014-10-12 DIAGNOSIS — Z8701 Personal history of pneumonia (recurrent): Secondary | ICD-10-CM | POA: Insufficient documentation

## 2014-10-12 DIAGNOSIS — I25119 Atherosclerotic heart disease of native coronary artery with unspecified angina pectoris: Secondary | ICD-10-CM | POA: Insufficient documentation

## 2014-10-12 DIAGNOSIS — I252 Old myocardial infarction: Secondary | ICD-10-CM | POA: Insufficient documentation

## 2014-10-12 DIAGNOSIS — Y990 Civilian activity done for income or pay: Secondary | ICD-10-CM | POA: Diagnosis not present

## 2014-10-12 DIAGNOSIS — Z955 Presence of coronary angioplasty implant and graft: Secondary | ICD-10-CM | POA: Insufficient documentation

## 2014-10-12 DIAGNOSIS — K219 Gastro-esophageal reflux disease without esophagitis: Secondary | ICD-10-CM | POA: Diagnosis not present

## 2014-10-12 DIAGNOSIS — Z8709 Personal history of other diseases of the respiratory system: Secondary | ICD-10-CM | POA: Diagnosis not present

## 2014-10-12 DIAGNOSIS — Z7982 Long term (current) use of aspirin: Secondary | ICD-10-CM | POA: Diagnosis not present

## 2014-10-12 DIAGNOSIS — X58XXXA Exposure to other specified factors, initial encounter: Secondary | ICD-10-CM | POA: Insufficient documentation

## 2014-10-12 DIAGNOSIS — Z7902 Long term (current) use of antithrombotics/antiplatelets: Secondary | ICD-10-CM | POA: Insufficient documentation

## 2014-10-12 DIAGNOSIS — Y9289 Other specified places as the place of occurrence of the external cause: Secondary | ICD-10-CM | POA: Diagnosis not present

## 2014-10-12 DIAGNOSIS — Z79899 Other long term (current) drug therapy: Secondary | ICD-10-CM | POA: Diagnosis not present

## 2014-10-12 DIAGNOSIS — Z791 Long term (current) use of non-steroidal anti-inflammatories (NSAID): Secondary | ICD-10-CM | POA: Insufficient documentation

## 2014-10-12 DIAGNOSIS — S3992XA Unspecified injury of lower back, initial encounter: Secondary | ICD-10-CM | POA: Insufficient documentation

## 2014-10-12 DIAGNOSIS — I10 Essential (primary) hypertension: Secondary | ICD-10-CM | POA: Diagnosis not present

## 2014-10-12 DIAGNOSIS — Y9389 Activity, other specified: Secondary | ICD-10-CM | POA: Diagnosis not present

## 2014-10-12 DIAGNOSIS — Z720 Tobacco use: Secondary | ICD-10-CM | POA: Diagnosis not present

## 2014-10-12 DIAGNOSIS — Z792 Long term (current) use of antibiotics: Secondary | ICD-10-CM | POA: Diagnosis not present

## 2014-10-12 DIAGNOSIS — Z9889 Other specified postprocedural states: Secondary | ICD-10-CM | POA: Insufficient documentation

## 2014-10-12 MED ORDER — HYDROCODONE-ACETAMINOPHEN 5-325 MG PO TABS: 1.0000 | ORAL_TABLET | ORAL | Status: DC | PRN

## 2014-10-12 MED ORDER — METHOCARBAMOL 750 MG PO TABS: 750.0000 mg | ORAL_TABLET | Freq: Two times a day (BID) | ORAL | Status: DC

## 2014-10-12 NOTE — ED Provider Notes (Signed)
CSN: [id number]     Arrival date & time 10/12/14  2134 History  This chart was scribed for non-physician practitioner working with Evelina Bucy, MD by Mercy Moore, ED Scribe. This patient was seen in room TR08C/TR08C and the patient's care was started at 10:17 PM.   Chief Complaint  Patient presents with  . Back Pain   The history is provided by the patient. No language interpreter was used.   HPI Comments: Andrew West is a 48 y.o. male with history of chronic back pain, two year old back surgery, and Diabetes who presents to the Emergency Department complaining of acute on chronic lower back pain which developed after attempting move a heavy table at work two weeks ago. Patient reports audible pop at the time of injury. Patient describes aching lower back pain with bilateral radiation of burning and pins and needles pain into his lower extremities since. Patient reports treatment with ibuprofen and prescription medications at home without relief. Patient denies recent falls. Patient denies treatment with pain clinic. Patient still has contact information for his surgeon, but has yet to inform of his new current pain.  Patient denies bladder/bowel incontinence.   Past Medical History  Diagnosis Date  . Myocardial infarction     2002; treated with stent to mid L circumflex  . Heart disease   . Hypercholesteremia   . Coronary artery disease   . NSTEMI (non-ST elevated myocardial infarction) 10/18/2011  . CAD (coronary artery disease), native coronary artery 04/18/2008  . Diabetes mellitus     Takes Metformin  . Hypertension     Takes Lisinopril   . GERD (gastroesophageal reflux disease)     Takes Dexilant  . Angina     Takes Isosorbide & Nitroglycerin  . Headache(784.0)   . Pneumonia     approx 2 years ago  . Bronchitis     hx of  . Shortness of breath     Occurs while laying down at times  . History of nuclear stress test 07/29/2011    decreased activity in septum on stress  images compared with rest; wall motion abnormality, EF 53%  . Rhabdomyolysis     h/o, r/t statins  . H/O cardiac catheterization     with 5 separate dated interventions   Past Surgical History  Procedure Laterality Date  . Coronary angioplasty with stent placement  10/09/2001    PTCA & stenting of mid AV circumflex; 2.5x31mm Pixel stent  . Colonoscopy w/ polypectomy    . Lumbar laminectomy/decompression microdiscectomy  03/31/2012    Procedure: LUMBAR LAMINECTOMY/DECOMPRESSION MICRODISCECTOMY 1 LEVEL;  Surgeon: Charlie Pitter, MD;  Location: [facility];  Service: Neurosurgery;  Laterality: Left;  . Transthoracic echocardiogram  07/28/2011    EF 55-65%; LVH, grade 1 diastolic dysfunction;   . Coronary angioplasty with stent placement  12/13/2001    PCI with stent to mid L circumflex, 95% stenosis to 0% residual  . Coronary angioplasty with stent placement  10/10/2003    PCI to mid AV circumflex; LAD 30% disease; RCA 100% occluded prox.  . Coronary angioplasty with stent placement  09/01/2011    PCI with stenting with bare metal stent to mid AV groove circumflex and PDA  . Coronary angioplasty with stent placement  10/17/2011    cutting balloon angioplasty of ostial lateral OM1 branch and bifurcation AV groove circumflex OM junction; stenosis reduced to 0%   Family History  Problem Relation Age of Onset  . Anesthesia problems Neg Hx   .  Hypotension Neg Hx   . Malignant hyperthermia Neg Hx   . Pseudochol deficiency Neg Hx   . Hypertension Mother   . Heart attack Father   . Diabetes Mother   . Heart disease Brother     x 3   . Heart attack Brother     deceased  . Hypertension Sister   . Diabetes Sister    History  Substance Use Topics  . Smoking status: Current Some Day Smoker -- 25 years    Types: Cigarettes  . Smokeless tobacco: Never Used     Comment: 1 pack lasts about 2 weeks  . Alcohol Use: No    Review of Systems  Constitutional: Negative for chills.   Gastrointestinal: Negative for diarrhea and constipation.  Genitourinary: Negative for hematuria.  Musculoskeletal: Positive for back pain.  Neurological: Negative for weakness and numbness.      Allergies  Iohexol and Vicodin  Home Medications   Prior to Admission medications   Medication Sig Start Date End Date Taking? Authorizing Provider  acetaminophen (TYLENOL) 500 MG tablet Take 1,000 mg by mouth every 6 (six) hours as needed for headache.    Historical Provider, MD  amLODipine (NORVASC) 10 MG tablet Take 1 tablet (10 mg total) by mouth daily. 10/04/13   Gregor Hams, MD  amoxicillin (AMOXIL) 500 MG capsule Take 1 capsule (500 mg total) by mouth 3 (three) times daily. 09/18/14   Nicole Pisciotta, PA-C  aspirin 325 MG tablet Take 325 mg by mouth daily.    Historical Provider, MD  clopidogrel (PLAVIX) 75 MG tablet Take 75 mg by mouth daily.    Historical Provider, MD  dexlansoprazole (DEXILANT) 60 MG capsule Take 60 mg by mouth daily.    Historical Provider, MD  HYDROcodone-acetaminophen (NORCO/VICODIN) 5-325 MG per tablet Take 1 tablet by mouth every 4 (four) hours as needed. 10/12/14   Noland Fordyce, PA-C  ibuprofen (ADVIL,MOTRIN) 200 MG tablet Take 400 mg by mouth every 6 (six) hours as needed for headache.    Historical Provider, MD  isosorbide mononitrate (IMDUR) 30 MG 24 hr tablet Take 30 mg by mouth 2 (two) times daily.    Historical Provider, MD  lisinopril-hydrochlorothiazide (PRINZIDE,ZESTORETIC) 20-12.5 MG per tablet Take 0.5 tablets by mouth daily.     Historical Provider, MD  meloxicam (MOBIC) 15 MG tablet Take 1 tablet (15 mg total) by mouth daily. 10/04/14   Clayton Bibles, PA-C  metFORMIN (GLUCOPHAGE) 1000 MG tablet Take 1,000 mg by mouth 2 (two) times daily with a meal. Restart this medication on Thursday, Oct 21, 2011. 10/19/11   Brett Canales, PA-C  methocarbamol (ROBAXIN) 750 MG tablet Take 1 tablet (750 mg total) by mouth 2 (two) times daily. 10/12/14   Noland Fordyce, PA-C  metoprolol (LOPRESSOR) 50 MG tablet Take 1 tablet (50 mg total) by mouth 2 (two) times daily. 09/07/13   Pixie Casino, MD  nitroGLYCERIN (NITROSTAT) 0.4 MG SL tablet Place 1 tablet (0.4 mg total) under the tongue every 5 (five) minutes as needed for chest pain. 09/07/13   Pixie Casino, MD  oxyCODONE-acetaminophen (PERCOCET/ROXICET) 5-325 MG per tablet 1 to 2 tabs PO q6hrs  PRN for pain 10/04/14   Clayton Bibles, PA-C  pravastatin (PRAVACHOL) 40 MG tablet Take 40 mg by mouth every morning.     Historical Provider, MD  simvastatin (ZOCOR) 20 MG tablet Take 20 mg by mouth at bedtime.  07/19/13   Pixie Casino, MD  valsartan-hydrochlorothiazide (DIOVAN-HCT) 320-12.5 MG  per tablet Take 1 tablet by mouth daily.    Historical Provider, MD   Triage Vitals: BP 129/75 mmHg  Pulse 87  Temp(Src) 98.4 F (36.9 C) (Oral)  Resp 18  Ht 5\' 8"  (1.727 m)  Wt 270 lb (122.471 kg)  BMI 41.06 kg/m2  SpO2 97% Physical Exam  Constitutional: He is oriented to person, place, and time. He appears well-developed and well-nourished. No distress.  HENT:  Head: Normocephalic and atraumatic.  Eyes: EOM are normal.  Neck: Neck supple. No tracheal deviation present.  Cardiovascular: Normal rate.   Pulmonary/Chest: Effort normal. No respiratory distress.  Musculoskeletal: Normal range of motion.  Upper lumbar spine: well healed surgical scar. Tenderness over scar. No erythema, warmth or ecchymosis.   Neurological: He is alert and oriented to person, place, and time.  Antalgic gait. 5/5 strength in upper and lower extremities bilaterally. Sensation in tact  Skin: Skin is warm and dry.  Psychiatric: He has a normal mood and affect. His behavior is normal.  Nursing note and vitals reviewed.   ED Course  Procedures (including critical care time)  COORDINATION OF CARE: 10:25 PM- Patient advised to follow up with his surgeon. However, he will be given number for on call. Discussed treatment plan  with patient at bedside and patient agreed to plan.   Labs Review Labs Reviewed - No data to display  Imaging Review No results found.   EKG Interpretation None      MDM   Final diagnoses:  Midline low back pain without sciatica    Pt presenting to ED with c/o exacerbation of chronic low back pain after lifting a heavy table at work 2 weeks ago. Hx of spinal surgery 2 years ago. No red flag symptoms. Not concerned for cauda equina, epidural abscess or other emergent process taking place at this time. Due to pt's medical hx, currently taking plavix and hx of diabetes, will refrain from attempting a steroid dose pack or tramadol, although pt would likely benefit from pain management or f/u with neurosurgery due to hx of chronic back pain. Home care instructions provided. Pt verbalized understanding and agreement with tx plan.  I personally performed the services described in this documentation, which was scribed in my presence. The recorded information has been reviewed and is accurate.    Noland Fordyce, PA-C 10/12/14 2242  Evelina Bucy, MD 10/12/14 [phone]

## 2014-10-12 NOTE — ED Notes (Signed)
Pt. reports pain at lower back and bilateral legs onset 2 weeks ago after lifting a heavy table .

## 2014-10-12 NOTE — Discharge Instructions (Signed)
Back Exercises Back exercises help treat and prevent back injuries. The goal is to increase your strength in your belly (abdominal) and back muscles. These exercises can also help with flexibility. Start these exercises when told by your doctor. HOME CARE Back exercises include: Pelvic Tilt.  Lie on your back with your knees bent. Tilt your pelvis until the lower part of your back is against the floor. Hold this position 5 to 10 sec. Repeat this exercise 5 to 10 times. Knee to Chest.  Pull 1 knee up against your chest and hold for 20 to 30 seconds. Repeat this with the other knee. This may be done with the other leg straight or bent, whichever feels better. Then, pull both knees up against your chest. Sit-Ups or Curl-Ups.  Bend your knees 90 degrees. Start with tilting your pelvis, and do a partial, slow sit-up. Only lift your upper half 30 to 45 degrees off the floor. Take at least 2 to 3 seonds for each sit-up. Do not do sit-ups with your knees out straight. If partial sit-ups are difficult, simply do the above but with only tightening your belly (abdominal) muscles and holding it as told. Hip-Lift.  Lie on your back with your knees flexed 90 degrees. Push down with your feet and shoulders as you raise your hips 2 inches off the floor. Hold for 10 seconds, repeat 5 to 10 times. Back Arches.  Lie on your stomach. Prop yourself up on bent elbows. Slowly press on your hands, causing an arch in your low back. Repeat 3 to 5 times. Shoulder-Lifts.  Lie face down with arms beside your body. Keep hips and belly pressed to floor as you slowly lift your head and shoulders off the floor. Do not overdo your exercises. Be careful in the beginning. Exercises may cause you some mild back discomfort. If the pain lasts for more than 15 minutes, stop the exercises until you see your doctor. Improvement with exercise for back problems is slow.  Document Released: 12/11/2010 Document Revised: 01/31/2012  Document Reviewed: 09/09/2011 [facility] Patient Information 2015 [place], [place]. This information is not intended to replace advice given to you by your health care provider. Make sure you discuss any questions you have with your health care provider.  Back Injury Prevention Back injuries can be extremely painful and difficult to heal. After having one back injury, you are much more likely to experience another later on. It is important to learn how to avoid injuring or re-injuring your back. The following tips can help you to prevent a back injury. PHYSICAL FITNESS  Exercise regularly and try to develop good tone in your abdominal muscles. Your abdominal muscles provide a lot of the support needed by your back.  Do aerobic exercises (walking, jogging, biking, swimming) regularly.  Do exercises that increase balance and strength (tai chi, yoga) regularly. This can decrease your risk of falling and injuring your back.  Stretch before and after exercising.  Maintain a healthy weight. The more you weigh, the more stress is placed on your back. For every pound of weight, 10 times that amount of pressure is placed on the back. DIET  Talk to your caregiver about how much calcium and vitamin D you need per day. These nutrients help to prevent weakening of the bones (osteoporosis). Osteoporosis can cause broken (fractured) bones that lead to back pain.  Include good sources of calcium in your diet, such as dairy products, green, leafy vegetables, and products with calcium added (fortified).  Include good sources of vitamin D in your diet, such as milk and foods that are fortified with vitamin D. °· Consider taking a nutritional supplement or a multivitamin if needed. °· Stop smoking if you smoke. °POSTURE °· Sit and stand up straight. Avoid leaning forward when you sit or hunching over when you stand. °· Choose chairs with good low back (lumbar) support. °· If you work at a desk, sit close to your work  so you do not need to lean over. Keep your chin tucked in. Keep your neck drawn back and elbows bent at a right angle. Your arms should look like the letter "L." °· Sit high and close to the steering wheel when you drive. Add a lumbar support to your car seat if needed. °· Avoid sitting or standing in one position for too long. Take breaks to get up, stretch, and walk around at least once every hour. Take breaks if you are driving for long periods of time. °· Sleep on your side with your knees slightly bent, or sleep on your back with a pillow under your knees. Do not sleep on your stomach. °LIFTING, TWISTING, AND REACHING °· Avoid heavy lifting, especially repetitive lifting. If you must do heavy lifting: °¨ Stretch before lifting. °¨ Work slowly. °¨ Rest between lifts. °¨ Use carts and dollies to move objects when possible. °¨ Make several small trips instead of carrying 1 heavy load. °¨ Ask for help when you need it. °¨ Ask for help when moving big, awkward objects. °· Follow these steps when lifting: °¨ Stand with your feet shoulder-width apart. °¨ Get as close to the object as you can. Do not try to pick up heavy objects that are far from your body. °¨ Use handles or lifting straps if they are available. °¨ Bend at your knees. Squat down, but keep your heels off the floor. °¨ Keep your shoulders pulled back, your chin tucked in, and your back straight. °¨ Lift the object slowly, tightening the muscles in your legs, abdomen, and buttocks. Keep the object as close to the center of your body as possible. °¨ When you put a load down, use these same guidelines in reverse. °· Do not: °¨ Lift the object above your waist. °¨ Twist at the waist while lifting or carrying a load. Move your feet if you need to turn, not your waist. °¨ Bend over without bending at your knees. °· Avoid reaching over your head, across a table, or for an object on a high surface. °OTHER TIPS °· Avoid wet floors and keep sidewalks clear of ice  to prevent falls. °· Do not sleep on a mattress that is too soft or too hard. °· Keep items that are used frequently within easy reach. °· Put heavier objects on shelves at waist level and lighter objects on lower or higher shelves. °· Find ways to decrease your stress, such as exercise, massage, or relaxation techniques. Stress can build up in your muscles. Tense muscles are more vulnerable to injury. °· Seek treatment for depression or anxiety if needed. These conditions can increase your risk of developing back pain. °SEEK MEDICAL CARE IF: °· You injure your back. °· You have questions about diet, exercise, or other ways to prevent back injuries. °MAKE SURE YOU: °· Understand these instructions. °· Will watch your condition. °· Will get help right away if you are not doing well or get worse. °Document Released: 12/16/2004 Document Revised: 01/31/2012 Document Reviewed: 12/20/2011 °[employer]® Patient Information ©  2015 [employer]. This information is not intended to replace advice given to you by your health care provider. Make sure you discuss any questions you have with your health care provider. ° ° ED Resources °Pain Management Centers/Resources in the Surrounding Area ° °[facility] °[address], [address] °[place] [address] °[phone] ° °[facility] °[address] °[place] [address] °[phone] ° °[facility] °[address] °[place], [place] [address] °[phone] ° °[facility] °[address] °[place] [address] °[phone] ° °[facility] °[address] °[place] [address] °[phone] ° ° °

## 2014-10-23 ENCOUNTER — Telehealth: Payer: Self-pay | Admitting: Internal Medicine

## 2014-10-23 NOTE — Telephone Encounter (Signed)
Pt need a new prescription for his generic Plavix. Please call to [facility]-[phone]

## 2014-10-23 NOTE — Telephone Encounter (Signed)
Unable to reach pt or leave a message  He has not been seen in over one year, tried to call to make patient an appointment. Left the number for patient to call

## 2014-10-24 NOTE — Telephone Encounter (Signed)
Spoke with pt, aware he will need to be seen for refills. He reports he has been doing fine, patient made aware this is the office policy. Follow up scheduled

## 2014-10-24 NOTE — Telephone Encounter (Signed)
Please call pt back from yesterday.

## 2014-10-25 ENCOUNTER — Encounter: Payer: Self-pay | Admitting: Internal Medicine

## 2014-10-25 ENCOUNTER — Ambulatory Visit (INDEPENDENT_AMBULATORY_CARE_PROVIDER_SITE_OTHER): Payer: BC Managed Care – PPO | Admitting: Internal Medicine

## 2014-10-25 VITALS — BP 114/80 | HR 82 | Ht 68.0 in | Wt 267.8 lb

## 2014-10-25 DIAGNOSIS — M5116 Intervertebral disc disorders with radiculopathy, lumbar region: Secondary | ICD-10-CM

## 2014-10-25 DIAGNOSIS — E7800 Pure hypercholesterolemia, unspecified: Secondary | ICD-10-CM

## 2014-10-25 DIAGNOSIS — I10 Essential (primary) hypertension: Secondary | ICD-10-CM

## 2014-10-25 DIAGNOSIS — E669 Obesity, unspecified: Secondary | ICD-10-CM

## 2014-10-25 DIAGNOSIS — G609 Hereditary and idiopathic neuropathy, unspecified: Secondary | ICD-10-CM

## 2014-10-25 DIAGNOSIS — G629 Polyneuropathy, unspecified: Secondary | ICD-10-CM

## 2014-10-25 DIAGNOSIS — I2510 Atherosclerotic heart disease of native coronary artery without angina pectoris: Secondary | ICD-10-CM

## 2014-10-25 DIAGNOSIS — M549 Dorsalgia, unspecified: Secondary | ICD-10-CM

## 2014-10-25 DIAGNOSIS — M5441 Lumbago with sciatica, right side: Secondary | ICD-10-CM

## 2014-10-25 MED ORDER — LISINOPRIL 20 MG PO TABS: 20.0000 mg | ORAL_TABLET | Freq: Every day | ORAL | Status: DC

## 2014-10-25 MED ORDER — CLOPIDOGREL BISULFATE 75 MG PO TABS: 75.0000 mg | ORAL_TABLET | Freq: Every day | ORAL | Status: DC

## 2014-10-25 MED ORDER — VALSARTAN-HYDROCHLOROTHIAZIDE 320-12.5 MG PO TABS: 1.0000 | ORAL_TABLET | Freq: Every day | ORAL | Status: DC

## 2014-10-25 MED ORDER — SIMVASTATIN 20 MG PO TABS: 20.0000 mg | ORAL_TABLET | Freq: Every day | ORAL | Status: DC

## 2014-10-25 MED ORDER — METOPROLOL TARTRATE 50 MG PO TABS: 50.0000 mg | ORAL_TABLET | Freq: Two times a day (BID) | ORAL | Status: DC

## 2014-10-25 MED ORDER — ISOSORBIDE MONONITRATE ER 30 MG PO TB24: 30.0000 mg | ORAL_TABLET | Freq: Two times a day (BID) | ORAL | Status: DC

## 2014-10-25 MED ORDER — NITROGLYCERIN 0.4 MG SL SUBL: 0.4000 mg | SUBLINGUAL_TABLET | SUBLINGUAL | Status: DC | PRN

## 2014-10-25 NOTE — Progress Notes (Signed)
OFFICE NOTE  Chief Complaint:  Routine followup, preoperative evaluation  Primary Care Physician: Maggie Font, MD  HPI:  Andrew West  is a 48 year old gentleman with a history of coronary artery disease and stent placement to the circumflex and obtuse marginal and a history of in stent restenosis to the LAD. There is also a nondominant right coronary artery that is 100% occluded, filled with collaterals. He did have a stress test recently, which showed an EF of 53% and reversible septal ischemia in the setting of chest pain and after much convincing underwent cardiac catheterization.  He then underwent cutting balloon angioplasty to an ostial lateral OM1 branch and the bifurcation AV groove circumflex OM junction. This was successful at reducing the stenosis to 0%. This was in November 2012 and he did not return for followup appointment until April 2013. He was suffering from low back pain and has been evaluated and treated by Dr. Trenton Gammon with a laminectomy/discectomy, which he says was not helpful. Otherwise, he continues to smoke and occasionally complains of shortness of breath and some chest pain symptoms which are atypical. He is on long-acting and/or has not needed to take short-acting nitroglycerin. His other concern today is that he is having problems with his teeth and was recommended to have edentulation and by an oral surgeon Dr. Diona Browner. Finally, he is suffering from significant stress and depression due to recent separation with wife in dealing with his kids. This seems to be a big tablets on his continued smoking.  [name] returns today in the office. He feels fairly well. He denies any chest pain. He continues to have problems with low back pain and numbness and tingling in his legs. He is apparently status post laminectomy and microdiscectomy by Dr. Trenton Gammon and continues to have symptoms which may be related to that. He's also complaining of what sounds like  neuropathic pain. He is not currently on medication. He is asking for Tylenol 3 for pain today which I told him I did not prescribe. Ultimately there are no further surgical or nonsurgical options, he may need to go on a neuropathic pain medication or perhaps be referred to a pain management specialist. He is also looking for a new primary care provider as his primary care provider is close to retirement. He reports he has cut back his smoking to about 1 pack every 2 weeks. He is also been out of amlodipine and simvastatin although his blood pressure is well controlled.  PMHx:  Past Medical History  Diagnosis Date  . Myocardial infarction     2002; treated with stent to mid L circumflex  . Heart disease   . Hypercholesteremia   . Coronary artery disease   . NSTEMI (non-ST elevated myocardial infarction) 10/18/2011  . CAD (coronary artery disease), native coronary artery 04/18/2008  . Diabetes mellitus     Takes Metformin  . Hypertension     Takes Lisinopril   . GERD (gastroesophageal reflux disease)     Takes Dexilant  . Angina     Takes Isosorbide & Nitroglycerin  . Headache(784.0)   . Pneumonia     approx 2 years ago  . Bronchitis     hx of  . Shortness of breath     Occurs while laying down at times  . History of nuclear stress test 07/29/2011    decreased activity in septum on stress images compared with rest; wall motion abnormality, EF 53%  . Rhabdomyolysis  h/o, r/t statins  . H/O cardiac catheterization     with 5 separate dated interventions    Past Surgical History  Procedure Laterality Date  . Coronary angioplasty with stent placement  10/09/2001    PTCA & stenting of mid AV circumflex; 2.5x36mm Pixel stent  . Colonoscopy w/ polypectomy    . Lumbar laminectomy/decompression microdiscectomy  03/31/2012    Procedure: LUMBAR LAMINECTOMY/DECOMPRESSION MICRODISCECTOMY 1 LEVEL;  Surgeon: Charlie Pitter, MD;  Location: [facility];  Service: Neurosurgery;  Laterality: Left;   . Transthoracic echocardiogram  07/28/2011    EF 55-65%; LVH, grade 1 diastolic dysfunction;   . Coronary angioplasty with stent placement  12/13/2001    PCI with stent to mid L circumflex, 95% stenosis to 0% residual  . Coronary angioplasty with stent placement  10/10/2003    PCI to mid AV circumflex; LAD 30% disease; RCA 100% occluded prox.  . Coronary angioplasty with stent placement  09/01/2011    PCI with stenting with bare metal stent to mid AV groove circumflex and PDA  . Coronary angioplasty with stent placement  10/17/2011    cutting balloon angioplasty of ostial lateral OM1 branch and bifurcation AV groove circumflex OM junction; stenosis reduced to 0%    FAMHx:  Family History  Problem Relation Age of Onset  . Anesthesia problems Neg Hx   . Hypotension Neg Hx   . Malignant hyperthermia Neg Hx   . Pseudochol deficiency Neg Hx   . Hypertension Mother   . Heart attack Father   . Diabetes Mother   . Heart disease Brother     x 3   . Heart attack Brother     deceased  . Hypertension Sister   . Diabetes Sister     SOCHx:   reports that he has been smoking Cigarettes.  He has been smoking about 0.00 packs per day for the past 25 years. He has never used smokeless tobacco. He reports that he does not drink alcohol or use illicit drugs.  ALLERGIES:  Allergies  Allergen Reactions  . Iohexol      Desc: PT BECAME SOB AND CHEST TIGHTNESS AFTER CONTRAST INJECTION.  [name],RT-RCT., Onset Date: [id number]   . Vicodin [Hydrocodone-Acetaminophen] Itching and Rash    ROS: A comprehensive review of systems was negative except for: Constitutional: positive for fatigue Cardiovascular: positive for lower extremity edema Musculoskeletal: positive for arthralgias, back pain and neuropathy  HOME MEDS: Current Outpatient Prescriptions  Medication Sig Dispense Refill  . acetaminophen (TYLENOL) 500 MG tablet Take 1,000 mg by mouth every 6 (six) hours as needed for headache.      Marland Kitchen aspirin 325 MG tablet Take 325 mg by mouth daily.    . clopidogrel (PLAVIX) 75 MG tablet Take 1 tablet (75 mg total) by mouth daily. 90 tablet 3  . dexlansoprazole (DEXILANT) 60 MG capsule Take 60 mg by mouth daily.    . isosorbide mononitrate (IMDUR) 30 MG 24 hr tablet Take 1 tablet (30 mg total) by mouth 2 (two) times daily. 180 tablet 3  . lisinopril (PRINIVIL,ZESTRIL) 20 MG tablet Take 1 tablet (20 mg total) by mouth daily. 90 tablet 3  . metFORMIN (GLUCOPHAGE) 1000 MG tablet Take 1,000 mg by mouth 2 (two) times daily with a meal. Restart this medication on Thursday, Oct 21, 2011.    . metoprolol (LOPRESSOR) 50 MG tablet Take 1 tablet (50 mg total) by mouth 2 (two) times daily. 180 tablet 3  . nitroGLYCERIN (NITROSTAT) 0.4 MG  SL tablet Place 1 tablet (0.4 mg total) under the tongue every 5 (five) minutes as needed for chest pain. 25 tablet 3  . simvastatin (ZOCOR) 20 MG tablet Take 1 tablet (20 mg total) by mouth at bedtime. 90 tablet 3  . valsartan-hydrochlorothiazide (DIOVAN-HCT) 320-12.5 MG per tablet Take 1 tablet by mouth daily. 90 tablet 3   No current facility-administered medications for this visit.    LABS/IMAGING: No results found for this or any previous visit (from the past 48 hour(s)). No results found.  VITALS: BP 114/80 mmHg  Pulse 82  Ht 5\' 8"  (1.727 m)  Wt 267 lb 12.8 oz (121.473 kg)  BMI 40.73 kg/m2  EXAM: General appearance: alert and no distress Neck: no adenopathy, no carotid bruit, no JVD, supple, symmetrical, trachea midline and thyroid not enlarged, symmetric, no tenderness/mass/nodules Lungs: clear to auscultation bilaterally Heart: regular rate and rhythm, S1, S2 normal, no murmur, click, rub or gallop Abdomen: soft, non-tender; bowel sounds normal; no masses,  no organomegaly Extremities: extremities normal, atraumatic, no cyanosis or edema Pulses: 2+ and symmetric Skin: Skin color, texture, turgor normal. No rashes or lesions Neurologic: Grossly  normal  EKG: Normal sinus rhythm at 82  ASSESSMENT:  1. Coronary artery disease status post PCI and cutting balloon angioplasty over 2 years ago 2. Ongoing tobacco abuse 3. Hypertension 4. Dyslipidemia 5. Neuropathy 6. Persistent low back pain 7. Diabetes type 2 8. Morbid obesity  PLAN: 1.   Mr. Leider has multiple cardiac risk factors and still has continued risks for coronary artery disease. His blood pressure is well controlled on current medications and unfortunately continues to smoke. His main complaints have to do with persistent back pain and numbness and tingling in his legs. He has had prior back surgery and I will refer him back to see Dr. Trenton Gammon for other options. Ultimately, he may need to go on to neuropathic pain medicine. The pain management referral may be a good option. Finally, his blood pressure is well controlled despite the fact that he's been out of his amlodipine. We will not refill that today as it does not seem to be necessary medicine for him. We will refill his cluster medicine and i have encouraged him to take it.  Pixie Casino, MD, [facility] Attending Cardiologist [facility] 10/25/2014, 1:17 PM

## 2014-10-25 NOTE — Patient Instructions (Addendum)
Your physician has recommended you make the following change in your medication..  1. STOP amlodipine 2. STOP lisinopril-hctz 3. START lisinopril 20mg  once daily 4. Your medications have been refilled  Your physician wants you to follow-up in: 1 year with Dr. Debara Pickett. You will receive a reminder letter in the mail two months in advance. If you don't receive a letter, please call our office to schedule the follow-up appointment.  Dr. Debara Pickett has referred you to Dr. Sherley Bounds (neurosurgeon)

## 2014-10-30 ENCOUNTER — Encounter (HOSPITAL_COMMUNITY): Payer: Self-pay | Admitting: Cardiology

## 2014-11-04 ENCOUNTER — Other Ambulatory Visit: Payer: Self-pay | Admitting: Family Medicine

## 2014-11-04 DIAGNOSIS — R0602 Shortness of breath: Secondary | ICD-10-CM

## 2014-11-05 ENCOUNTER — Emergency Department (HOSPITAL_COMMUNITY)
Admission: EM | Admit: 2014-11-05 | Discharge: 2014-11-05 | Disposition: A | Discharge status: Home / Self Care | Payer: BC Managed Care – PPO | Attending: Emergency Medicine | Admitting: Emergency Medicine

## 2014-11-05 ENCOUNTER — Inpatient Hospital Stay: Admission: RE | Admit: 2014-11-05 | Payer: Self-pay | Source: Ambulatory Visit

## 2014-11-05 ENCOUNTER — Encounter (HOSPITAL_COMMUNITY): Payer: Self-pay | Admitting: Physical Medicine and Rehabilitation

## 2014-11-05 ENCOUNTER — Emergency Department (HOSPITAL_COMMUNITY): Payer: BC Managed Care – PPO

## 2014-11-05 DIAGNOSIS — I2510 Atherosclerotic heart disease of native coronary artery without angina pectoris: Secondary | ICD-10-CM | POA: Insufficient documentation

## 2014-11-05 DIAGNOSIS — Z720 Tobacco use: Secondary | ICD-10-CM | POA: Insufficient documentation

## 2014-11-05 DIAGNOSIS — J189 Pneumonia, unspecified organism: Secondary | ICD-10-CM

## 2014-11-05 DIAGNOSIS — Z7902 Long term (current) use of antithrombotics/antiplatelets: Secondary | ICD-10-CM | POA: Diagnosis not present

## 2014-11-05 DIAGNOSIS — I252 Old myocardial infarction: Secondary | ICD-10-CM | POA: Insufficient documentation

## 2014-11-05 DIAGNOSIS — K219 Gastro-esophageal reflux disease without esophagitis: Secondary | ICD-10-CM | POA: Insufficient documentation

## 2014-11-05 DIAGNOSIS — E780 Pure hypercholesterolemia: Secondary | ICD-10-CM | POA: Diagnosis not present

## 2014-11-05 DIAGNOSIS — Z8739 Personal history of other diseases of the musculoskeletal system and connective tissue: Secondary | ICD-10-CM | POA: Diagnosis not present

## 2014-11-05 DIAGNOSIS — J159 Unspecified bacterial pneumonia: Secondary | ICD-10-CM | POA: Insufficient documentation

## 2014-11-05 DIAGNOSIS — Z79899 Other long term (current) drug therapy: Secondary | ICD-10-CM | POA: Insufficient documentation

## 2014-11-05 DIAGNOSIS — E119 Type 2 diabetes mellitus without complications: Secondary | ICD-10-CM | POA: Insufficient documentation

## 2014-11-05 DIAGNOSIS — Z9861 Coronary angioplasty status: Secondary | ICD-10-CM | POA: Insufficient documentation

## 2014-11-05 DIAGNOSIS — Z9889 Other specified postprocedural states: Secondary | ICD-10-CM | POA: Diagnosis not present

## 2014-11-05 DIAGNOSIS — Z8701 Personal history of pneumonia (recurrent): Secondary | ICD-10-CM | POA: Diagnosis not present

## 2014-11-05 DIAGNOSIS — I10 Essential (primary) hypertension: Secondary | ICD-10-CM | POA: Diagnosis not present

## 2014-11-05 DIAGNOSIS — R0602 Shortness of breath: Secondary | ICD-10-CM

## 2014-11-05 DIAGNOSIS — Z7982 Long term (current) use of aspirin: Secondary | ICD-10-CM | POA: Insufficient documentation

## 2014-11-05 DIAGNOSIS — M79605 Pain in left leg: Secondary | ICD-10-CM | POA: Diagnosis present

## 2014-11-05 HISTORY — DX: Other chronic pain: G89.29

## 2014-11-05 HISTORY — DX: Pain in leg, unspecified: M79.606

## 2014-11-05 LAB — COMPREHENSIVE METABOLIC PANEL
ALK PHOS: 64 U/L (ref 39–117)
ALT: 26 U/L (ref 0–53)
AST: 19 U/L (ref 0–37)
Albumin: 3.7 g/dL (ref 3.5–5.2)
Anion gap: 12 (ref 5–15)
BUN: 15 mg/dL (ref 6–23)
CALCIUM: 9.3 mg/dL (ref 8.4–10.5)
CO2: 26 mEq/L (ref 19–32)
Chloride: 102 mEq/L (ref 96–112)
Creatinine, Ser: 0.87 mg/dL (ref 0.50–1.35)
GLUCOSE: 112 mg/dL — AB (ref 70–99)
Potassium: 4 mEq/L (ref 3.7–5.3)
Sodium: 140 mEq/L (ref 137–147)
TOTAL PROTEIN: 7.5 g/dL (ref 6.0–8.3)
Total Bilirubin: <0.2 mg/dL — ABNORMAL LOW (ref 0.3–1.2)

## 2014-11-05 LAB — CBC WITH DIFFERENTIAL/PLATELET
BASOS ABS: 0 10*3/uL (ref 0.0–0.1)
BASOS PCT: 0 % (ref 0–1)
EOS PCT: 3 % (ref 0–5)
Eosinophils Absolute: 0.2 10*3/uL (ref 0.0–0.7)
HEMATOCRIT: 41.9 % (ref 39.0–52.0)
Hemoglobin: 13.6 g/dL (ref 13.0–17.0)
Lymphocytes Relative: 49 % — ABNORMAL HIGH (ref 12–46)
Lymphs Abs: 3.8 10*3/uL (ref 0.7–4.0)
MCH: 26 pg (ref 26.0–34.0)
MCHC: 32.5 g/dL (ref 30.0–36.0)
MCV: 80.1 fL (ref 78.0–100.0)
MONO ABS: 0.7 10*3/uL (ref 0.1–1.0)
Monocytes Relative: 9 % (ref 3–12)
Neutro Abs: 3 10*3/uL (ref 1.7–7.7)
Neutrophils Relative %: 39 % — ABNORMAL LOW (ref 43–77)
Platelets: 247 10*3/uL (ref 150–400)
RBC: 5.23 MIL/uL (ref 4.22–5.81)
RDW: 13.7 % (ref 11.5–15.5)
WBC: 7.6 10*3/uL (ref 4.0–10.5)

## 2014-11-05 LAB — I-STAT TROPONIN, ED: Troponin i, poc: 0 ng/mL (ref 0.00–0.08)

## 2014-11-05 MED ORDER — AZITHROMYCIN 250 MG PO TABS: 250.0000 mg | ORAL_TABLET | Freq: Every day | ORAL | Status: DC

## 2014-11-05 MED ORDER — AZITHROMYCIN 250 MG PO TABS
500.0000 mg | ORAL_TABLET | Freq: Once | ORAL | Status: AC
  Administered 2014-11-05: 500 mg via ORAL
  Filled 2014-11-05: qty 2

## 2014-11-05 MED ORDER — DEXTROSE 5 % IV SOLN
1.0000 g | INTRAVENOUS | Status: DC
  Administered 2014-11-05: 1 g via INTRAVENOUS
  Filled 2014-11-05: qty 10

## 2014-11-05 MED ORDER — OXYCODONE-ACETAMINOPHEN 5-325 MG PO TABS: 2.0000 | ORAL_TABLET | ORAL | Status: DC | PRN

## 2014-11-05 NOTE — ED Notes (Signed)
Pt. Stated, I was sent here by dr. Ernie Hew for an evaluation.  I was suppose to have a CT scan with contrast and Im allergic to the dye so she sent me here for a follow up with you guys.  Called Dr. Ernie Hew and she stated, i sent him there for an evaluation.  He has a history of cardiac, and needs to be worked up for enzymes., EkG.

## 2014-11-05 NOTE — ED Notes (Signed)
Pt presents to department for evaluation of bilateral leg pain. Ongoing for several months. Reports burning sensation to both legs. Denies injury. No swelling noted. Ambulatory to triage.

## 2014-11-05 NOTE — ED Provider Notes (Signed)
CSN: [id number]     Arrival date & time 11/05/14  1658 History  This chart was scribed for non-physician practitioner, Alyse Low, PA-C working with Francine Graven, DO by Einar Pheasant, ED scribe. This patient was seen in room TR06C/TR06C and the patient's care was started at 5:46 PM.     Chief Complaint  Patient presents with  . Leg Pain    HPI HPI Comments: Andrew West is a 48 y.o. male with a PMhx of bulging disc presents to the Emergency Department complaining of gradual onset left leg pain that started 2 years ago after his back surgery. Pt describes the pain as burning in nature and rates it as a "8/10". Pt states that his cardiologist is in the process of finding him a specialist who can deal with his chronic leg pain. He denies currently taking any medication for his back pain or leg pain. Pt states that in the past he was prescribed Neurontin but it did not alleviate any of his symptoms. He states that his PCP sent he over here to get a chest xray and a ct of his head.  Pt has had chest pain and shortness of breath since yesterday.  Pt reports he was coughing up phelgm.    Past Medical History  Diagnosis Date  . Myocardial infarction     2002; treated with stent to mid L circumflex  . Heart disease   . Hypercholesteremia   . Coronary artery disease   . NSTEMI (non-ST elevated myocardial infarction) 10/18/2011  . CAD (coronary artery disease), native coronary artery 04/18/2008  . Diabetes mellitus     Takes Metformin  . Hypertension     Takes Lisinopril   . GERD (gastroesophageal reflux disease)     Takes Dexilant  . Angina     Takes Isosorbide & Nitroglycerin  . Headache(784.0)   . Pneumonia     approx 2 years ago  . Bronchitis     hx of  . Shortness of breath     Occurs while laying down at times  . History of nuclear stress test 07/29/2011    decreased activity in septum on stress images compared with rest; wall motion abnormality, EF 53%  . Rhabdomyolysis      h/o, r/t statins  . H/O cardiac catheterization     with 5 separate dated interventions   Past Surgical History  Procedure Laterality Date  . Coronary angioplasty with stent placement  10/09/2001    PTCA & stenting of mid AV circumflex; 2.5x62mm Pixel stent  . Colonoscopy w/ polypectomy    . Lumbar laminectomy/decompression microdiscectomy  03/31/2012    Procedure: LUMBAR LAMINECTOMY/DECOMPRESSION MICRODISCECTOMY 1 LEVEL;  Surgeon: Charlie Pitter, MD;  Location: [facility];  Service: Neurosurgery;  Laterality: Left;  . Transthoracic echocardiogram  07/28/2011    EF 55-65%; LVH, grade 1 diastolic dysfunction;   . Coronary angioplasty with stent placement  12/13/2001    PCI with stent to mid L circumflex, 95% stenosis to 0% residual  . Coronary angioplasty with stent placement  10/10/2003    PCI to mid AV circumflex; LAD 30% disease; RCA 100% occluded prox.  . Coronary angioplasty with stent placement  09/01/2011    PCI with stenting with bare metal stent to mid AV groove circumflex and PDA  . Coronary angioplasty with stent placement  10/17/2011    cutting balloon angioplasty of ostial lateral OM1 branch and bifurcation AV groove circumflex OM junction; stenosis reduced to 0%  .  Left heart catheterization with coronary angiogram N/A 10/18/2011    Procedure: LEFT HEART CATHETERIZATION WITH CORONARY ANGIOGRAM;  Surgeon: Leonie Man, MD;  Location: [facility] CATH LAB;  Service: Cardiovascular;  Laterality: N/A;   Family History  Problem Relation Age of Onset  . Anesthesia problems Neg Hx   . Hypotension Neg Hx   . Malignant hyperthermia Neg Hx   . Pseudochol deficiency Neg Hx   . Hypertension Mother   . Heart attack Father   . Diabetes Mother   . Heart disease Brother     x 3   . Heart attack Brother     deceased  . Hypertension Sister   . Diabetes Sister    History  Substance Use Topics  . Smoking status: Current Some Day Smoker -- 25 years    Types: Cigarettes  . Smokeless tobacco:  Never Used     Comment: 1 pack lasts about 2 weeks  . Alcohol Use: No    Review of Systems    Allergies  Iohexol and Vicodin  Home Medications   Prior to Admission medications   Medication Sig Start Date End Date Taking? Authorizing Provider  acetaminophen (TYLENOL) 500 MG tablet Take 1,000 mg by mouth every 6 (six) hours as needed for headache.    Historical Provider, MD  aspirin 325 MG tablet Take 325 mg by mouth daily.    Historical Provider, MD  clopidogrel (PLAVIX) 75 MG tablet Take 1 tablet (75 mg total) by mouth daily. 10/25/14   Pixie Casino, MD  dexlansoprazole (DEXILANT) 60 MG capsule Take 60 mg by mouth daily.    Historical Provider, MD  isosorbide mononitrate (IMDUR) 30 MG 24 hr tablet Take 1 tablet (30 mg total) by mouth 2 (two) times daily. 10/25/14   Pixie Casino, MD  lisinopril (PRINIVIL,ZESTRIL) 20 MG tablet Take 1 tablet (20 mg total) by mouth daily. 10/25/14   Pixie Casino, MD  metFORMIN (GLUCOPHAGE) 1000 MG tablet Take 1,000 mg by mouth 2 (two) times daily with a meal. Restart this medication on Thursday, Oct 21, 2011. 10/19/11   Brett Canales, PA-C  metoprolol (LOPRESSOR) 50 MG tablet Take 1 tablet (50 mg total) by mouth 2 (two) times daily. 10/25/14   Pixie Casino, MD  nitroGLYCERIN (NITROSTAT) 0.4 MG SL tablet Place 1 tablet (0.4 mg total) under the tongue every 5 (five) minutes as needed for chest pain. 10/25/14   Pixie Casino, MD  simvastatin (ZOCOR) 20 MG tablet Take 1 tablet (20 mg total) by mouth at bedtime. 10/25/14   Pixie Casino, MD  valsartan-hydrochlorothiazide (DIOVAN-HCT) 320-12.5 MG per tablet Take 1 tablet by mouth daily. 10/25/14   Pixie Casino, MD   BP 136/82 mmHg  Pulse 89  Temp(Src) 98.3 F (36.8 C) (Oral)  Resp 18  Ht 5\' 8"  (1.727 m)  Wt 340 lb (154.223 kg)  BMI 51.71 kg/m2  SpO2 98% Physical Exam  Constitutional: He appears well-developed and well-nourished.  HENT:  Head: Normocephalic.  Right Ear: External ear  normal.  Nose: Nose normal.  Mouth/Throat: Oropharynx is clear and moist.  Cardiovascular: Normal rate and normal heart sounds.   Pulmonary/Chest: Effort normal.  Abdominal: Soft.  Musculoskeletal: Normal range of motion.  Neurological: He is alert.  Skin: Skin is warm.  Psychiatric: He has a normal mood and affect.    ED Course  Procedures (including critical care time)  DIAGNOSTIC STUDIES: Oxygen Saturation is 97% on room air, normal,  my interpretation.  COORDINATION OF CARE: 5:46 PM- Pt advised of plan for treatment and pt agrees.  Labs Review Labs Reviewed  CBC WITH DIFFERENTIAL - Abnormal; Notable for the following:    Neutrophils Relative % 39 (*)    Lymphocytes Relative 49 (*)    All other components within normal limits  COMPREHENSIVE METABOLIC PANEL - Abnormal; Notable for the following:    Glucose, Bld 112 (*)    Total Bilirubin <0.2 (*)    All other components within normal limits  I-STAT TROPOININ, ED    Imaging Review Dg Chest 2 View  11/05/2014   CLINICAL DATA:  Chest pain, mid portion, for 1 day; productive cough for 2 weeks.  EXAM: CHEST  2 VIEW  COMPARISON:  Chest radiograph January 23, 2014; chest CT June 21, 2014  FINDINGS: There is patchy infiltrate in the left base posteriorly. Elsewhere lungs are clear. Heart is upper normal in size with pulmonary vascularity within normal limits. No pneumothorax. No adenopathy. No bone lesions.  IMPRESSION: Left base infiltrate.   Electronically Signed   By: Lowella Grip M.D.   On: 11/05/2014 20:22     EKG Interpretation None    normal sinus 76 no ekg changes  No st changes   MDM  Rn in [facility] spoke to Dr. Ernie Hew  Who reports she spoke to charge rn about pt. Pt to have EKG and further evaluation.  I discussed with Dr. Thurnell Garbe.   We will obtain chest xray and labs   Final diagnoses:  SOB (shortness of breath)  Community acquired pneumonia      I personally performed the services described in this  documentation, which was scribed in my presence. The recorded information has been reviewed and is accurate.    Fransico Meadow, PA-C 11/05/14 [place], PA-C 11/05/14 [place], PA-C 11/05/14 [id number]  Francine Graven, DO 11/08/14 [phone]

## 2014-11-05 NOTE — Discharge Instructions (Signed)

## 2014-11-24 ENCOUNTER — Telehealth: Payer: Self-pay | Admitting: Physician Assistant

## 2014-11-24 NOTE — Telephone Encounter (Signed)
Pt with PMH of HTN, HLD, DM, CAD and herniated disc contacted after hour cardiology for intermittent L arm and L leg pain for the last 3 days. He states his previous MI presented with midsternal chest pressure, however the sensation he feels now is entirely different. I have instructed the patient to contact his PCP. I doubt his current symptom is associated with CAD, esp since he does not have any chest pain.   I am not clear about etiology which could be either DM neuropathy or impinged nerve. He denies any recent trauma.   Hilbert Corrigan PA Pager: [phone]

## 2015-01-08 ENCOUNTER — Encounter (HOSPITAL_COMMUNITY): Payer: Self-pay | Admitting: Emergency Medicine

## 2015-01-08 ENCOUNTER — Emergency Department (HOSPITAL_COMMUNITY)
Admission: EM | Admit: 2015-01-08 | Discharge: 2015-01-08 | Disposition: A | Discharge status: Home / Self Care | Payer: BC Managed Care – PPO | Attending: Emergency Medicine | Admitting: Emergency Medicine

## 2015-01-08 DIAGNOSIS — K088 Other specified disorders of teeth and supporting structures: Secondary | ICD-10-CM | POA: Diagnosis present

## 2015-01-08 DIAGNOSIS — Z9861 Coronary angioplasty status: Secondary | ICD-10-CM | POA: Insufficient documentation

## 2015-01-08 DIAGNOSIS — I10 Essential (primary) hypertension: Secondary | ICD-10-CM | POA: Diagnosis not present

## 2015-01-08 DIAGNOSIS — Z720 Tobacco use: Secondary | ICD-10-CM | POA: Insufficient documentation

## 2015-01-08 DIAGNOSIS — Z8739 Personal history of other diseases of the musculoskeletal system and connective tissue: Secondary | ICD-10-CM | POA: Diagnosis not present

## 2015-01-08 DIAGNOSIS — Z79899 Other long term (current) drug therapy: Secondary | ICD-10-CM | POA: Insufficient documentation

## 2015-01-08 DIAGNOSIS — K219 Gastro-esophageal reflux disease without esophagitis: Secondary | ICD-10-CM | POA: Diagnosis not present

## 2015-01-08 DIAGNOSIS — I2510 Atherosclerotic heart disease of native coronary artery without angina pectoris: Secondary | ICD-10-CM | POA: Diagnosis not present

## 2015-01-08 DIAGNOSIS — Z7982 Long term (current) use of aspirin: Secondary | ICD-10-CM | POA: Insufficient documentation

## 2015-01-08 DIAGNOSIS — E669 Obesity, unspecified: Secondary | ICD-10-CM | POA: Diagnosis not present

## 2015-01-08 DIAGNOSIS — E780 Pure hypercholesterolemia: Secondary | ICD-10-CM | POA: Insufficient documentation

## 2015-01-08 DIAGNOSIS — I252 Old myocardial infarction: Secondary | ICD-10-CM | POA: Insufficient documentation

## 2015-01-08 DIAGNOSIS — Z7902 Long term (current) use of antithrombotics/antiplatelets: Secondary | ICD-10-CM | POA: Diagnosis not present

## 2015-01-08 DIAGNOSIS — Z8701 Personal history of pneumonia (recurrent): Secondary | ICD-10-CM | POA: Diagnosis not present

## 2015-01-08 DIAGNOSIS — E119 Type 2 diabetes mellitus without complications: Secondary | ICD-10-CM | POA: Diagnosis not present

## 2015-01-08 DIAGNOSIS — G8929 Other chronic pain: Secondary | ICD-10-CM | POA: Insufficient documentation

## 2015-01-08 DIAGNOSIS — Z9889 Other specified postprocedural states: Secondary | ICD-10-CM | POA: Insufficient documentation

## 2015-01-08 DIAGNOSIS — K029 Dental caries, unspecified: Secondary | ICD-10-CM | POA: Diagnosis not present

## 2015-01-08 MED ORDER — PENICILLIN V POTASSIUM 500 MG PO TABS: 500.0000 mg | ORAL_TABLET | Freq: Three times a day (TID) | ORAL | Status: DC

## 2015-01-08 MED ORDER — TRAMADOL HCL 50 MG PO TABS: 50.0000 mg | ORAL_TABLET | Freq: Four times a day (QID) | ORAL | Status: DC | PRN

## 2015-01-08 NOTE — Discharge Instructions (Signed)
Please follow up with a dentist or oral surgeon for further management of your recurrent dental pain.  Take antibiotic and pain medication as prescribed.    Dental Caries Dental caries (also called tooth decay) is the most common oral disease. It can occur at any age but is more common in children and young adults.  HOW DENTAL CARIES DEVELOPS  The process of decay begins when bacteria and foods (particularly sugars and starches) combine in your mouth to produce plaque. Plaque is a substance that sticks to the hard, outer surface of a tooth (enamel). The bacteria in plaque produce acids that attack enamel. These acids may also attack the root surface of a tooth (cementum) if it is exposed. Repeated attacks dissolve these surfaces and create holes in the tooth (cavities). If left untreated, the acids destroy the other layers of the tooth.  RISK FACTORS  Frequent sipping of sugary beverages.   Frequent snacking on sugary and starchy foods, especially those that easily get stuck in the teeth.   Poor oral hygiene.   Dry mouth.   Substance abuse such as methamphetamine abuse.   Broken or poor-fitting dental restorations.   Eating disorders.   Gastroesophageal reflux disease (GERD).   Certain radiation treatments to the head and neck. SYMPTOMS In the early stages of dental caries, symptoms are seldom present. Sometimes white, chalky areas may be seen on the enamel or other tooth layers. In later stages, symptoms may include:  Pits and holes on the enamel.  Toothache after sweet, hot, or cold foods or drinks are consumed.  Pain around the tooth.  Swelling around the tooth. DIAGNOSIS  Most of the time, dental caries is detected during a regular dental checkup. A diagnosis is made after a thorough medical and dental history is taken and the surfaces of your teeth are checked for signs of dental caries. Sometimes special instruments, such as lasers, are used to check for dental  caries. Dental X-ray exams may be taken so that areas not visible to the eye (such as between the contact areas of the teeth) can be checked for cavities.  TREATMENT  If dental caries is in its early stages, it may be reversed with a fluoride treatment or an application of a remineralizing agent at the dental office. Thorough brushing and flossing at home is needed to aid these treatments. If it is in its later stages, treatment depends on the location and extent of tooth destruction:   If a small area of the tooth has been destroyed, the destroyed area will be removed and cavities will be filled with a material such as gold, silver amalgam, or composite resin.   If a large area of the tooth has been destroyed, the destroyed area will be removed and a cap (crown) will be fitted over the remaining tooth structure.   If the center part of the tooth (pulp) is affected, a procedure called a root canal will be needed before a filling or crown can be placed.   If most of the tooth has been destroyed, the tooth may need to be pulled (extracted). HOME CARE INSTRUCTIONS You can prevent, stop, or reverse dental caries at home by practicing good oral hygiene. Good oral hygiene includes:  Thoroughly cleaning your teeth at least twice a day with a toothbrush and dental floss.   Using a fluoride toothpaste. A fluoride mouth rinse may also be used if recommended by your dentist or health care provider.   Restricting the amount of sugary  and starchy foods and sugary liquids you consume.   Avoiding frequent snacking on these foods and sipping of these liquids.   Keeping regular visits with a dentist for checkups and cleanings. PREVENTION   Practice good oral hygiene.  Consider a dental sealant. A dental sealant is a coating material that is applied by your dentist to the pits and grooves of teeth. The sealant prevents food from being trapped in them. It may protect the teeth for several  years.  Ask about fluoride supplements if you live in a community without fluorinated water or with water that has a low fluoride content. Use fluoride supplements as directed by your dentist or health care provider.  Allow fluoride varnish applications to teeth if directed by your dentist or health care provider. Document Released: 07/31/2002 Document Revised: 03/25/2014 Document Reviewed: 11/10/2012 [facility] Patient Information 2015 [place], [place]. This information is not intended to replace advice given to you by your health care provider. Make sure you discuss any questions you have with your health care provider.

## 2015-01-08 NOTE — ED Provider Notes (Signed)
CSN: [id number]     Arrival date & time 01/08/15  0601 History   First MD Initiated Contact with Patient 01/08/15 [phone]     Chief Complaint  Patient presents with  . Dental Pain     (Consider location/radiation/quality/duration/timing/severity/associated sxs/prior Treatment) HPI   49 year old morbidly obese male with history of prior MI status post stenting currently on Plavix, recurrent headache, diabetes, chronic pain presents complaining of tooth aches. Patient admits that he has horrible dentition and has had recurrent dental pain for the past several months. However for the past 2 weeks he has had increasing pain to his right upper jaw line in which she described as a throbbing sensation, persistent, worsening with chewing and with cold air. He has tried taking home remedies including Tylenol, gargle with Listerine and salt water with minimal relief. Pain is currently 10 out of 10 with an associated headache. He admits that he has history of heart problem including MI but denies having any chest pain, lightheadedness, dizziness, nausea, diaphoresis, shortness of breath. Patient states he cannot afford dental care at this time. He does not have a dentist. Otherwise patient also denies having fever, runny nose, sneezing, coughing, ear pain, sore throat.  Past Medical History  Diagnosis Date  . Myocardial infarction     2002; treated with stent to mid L circumflex  . Heart disease   . Hypercholesteremia   . Coronary artery disease   . NSTEMI (non-ST elevated myocardial infarction) 10/18/2011  . CAD (coronary artery disease), native coronary artery 04/18/2008  . Diabetes mellitus     Takes Metformin  . Hypertension     Takes Lisinopril   . GERD (gastroesophageal reflux disease)     Takes Dexilant  . Angina     Takes Isosorbide & Nitroglycerin  . Headache(784.0)   . Pneumonia     approx 2 years ago  . Bronchitis     hx of  . Shortness of breath     Occurs while laying down at times   . History of nuclear stress test 07/29/2011    decreased activity in septum on stress images compared with rest; wall motion abnormality, EF 53%  . Rhabdomyolysis     h/o, r/t statins  . H/O cardiac catheterization     with 5 separate dated interventions  . Chronic leg pain     bilateral  . Chronic back pain    Past Surgical History  Procedure Laterality Date  . Coronary angioplasty with stent placement  10/09/2001    PTCA & stenting of mid AV circumflex; 2.5x63mm Pixel stent  . Colonoscopy w/ polypectomy    . Lumbar laminectomy/decompression microdiscectomy  03/31/2012    Procedure: LUMBAR LAMINECTOMY/DECOMPRESSION MICRODISCECTOMY 1 LEVEL;  Surgeon: Charlie Pitter, MD;  Location: [place] [facility];  Service: Neurosurgery;  Laterality: Left;  . Transthoracic echocardiogram  07/28/2011    EF 55-65%; LVH, grade 1 diastolic dysfunction;   . Coronary angioplasty with stent placement  12/13/2001    PCI with stent to mid L circumflex, 95% stenosis to 0% residual  . Coronary angioplasty with stent placement  10/10/2003    PCI to mid AV circumflex; LAD 30% disease; RCA 100% occluded prox.  . Coronary angioplasty with stent placement  09/01/2011    PCI with stenting with bare metal stent to mid AV groove circumflex and PDA  . Coronary angioplasty with stent placement  10/17/2011    cutting balloon angioplasty of ostial lateral OM1 branch and bifurcation AV groove circumflex OM  junction; stenosis reduced to 0%  . Left heart catheterization with coronary angiogram N/A 10/18/2011    Procedure: LEFT HEART CATHETERIZATION WITH CORONARY ANGIOGRAM;  Surgeon: Leonie Man, MD;  Location: [facility] C[facility];  Service: Cardiovascular;  Laterality: N/A;   Family History  Problem Relation Age of Onset  . Anesthesia problems Neg Hx   . Hypotension Neg Hx   . Malignant hyperthermia Neg Hx   . Pseudochol deficiency Neg Hx   . Hypertension Mother   . Heart attack Father   . Diabetes Mother   . Heart disease Brother      x 3   . Heart attack Brother     deceased  . Hypertension Sister   . Diabetes Sister    History  Substance Use Topics  . Smoking status: Current Some Day Smoker -- 25 years    Types: Cigarettes  . Smokeless tobacco: Never Used     Comment: 1 pack lasts about 2 weeks  . Alcohol Use: No    Review of Systems  All other systems reviewed and are negative.     Allergies  Iohexol and Vicodin  Home Medications   Prior to Admission medications   Medication Sig Start Date End Date Taking? Authorizing Provider  acetaminophen (TYLENOL) 500 MG tablet Take 1,000 mg by mouth every 6 (six) hours as needed for headache.    Historical Provider, MD  aspirin 325 MG tablet Take 325 mg by mouth daily.    Historical Provider, MD  azithromycin (ZITHROMAX) 250 MG tablet Take 1 tablet (250 mg total) by mouth daily. Take first 2 tablets together, then 1 every day until finished. 11/05/14   Fransico Meadow, PA-C  clopidogrel (PLAVIX) 75 MG tablet Take 1 tablet (75 mg total) by mouth daily. 10/25/14   Pixie Casino, MD  dexlansoprazole (DEXILANT) 60 MG capsule Take 60 mg by mouth daily.    Historical Provider, MD  isosorbide mononitrate (IMDUR) 30 MG 24 hr tablet Take 1 tablet (30 mg total) by mouth 2 (two) times daily. 10/25/14   Pixie Casino, MD  lisinopril (PRINIVIL,ZESTRIL) 20 MG tablet Take 1 tablet (20 mg total) by mouth daily. 10/25/14   Pixie Casino, MD  metFORMIN (GLUCOPHAGE) 1000 MG tablet Take 1,000 mg by mouth 2 (two) times daily with a meal. Restart this medication on Thursday, Oct 21, 2011. 10/19/11   Brett Canales, PA-C  metoprolol (LOPRESSOR) 50 MG tablet Take 1 tablet (50 mg total) by mouth 2 (two) times daily. 10/25/14   Pixie Casino, MD  nitroGLYCERIN (NITROSTAT) 0.4 MG SL tablet Place 1 tablet (0.4 mg total) under the tongue every 5 (five) minutes as needed for chest pain. 10/25/14   Pixie Casino, MD  oxyCODONE-acetaminophen (PERCOCET/ROXICET) 5-325 MG per tablet Take 2  tablets by mouth every 4 (four) hours as needed for moderate pain or severe pain. 11/05/14   Fransico Meadow, PA-C  simvastatin (ZOCOR) 20 MG tablet Take 1 tablet (20 mg total) by mouth at bedtime. 10/25/14   Pixie Casino, MD  valsartan-hydrochlorothiazide (DIOVAN-HCT) 320-12.5 MG per tablet Take 1 tablet by mouth daily. 10/25/14   Pixie Casino, MD   BP 137/79 mmHg  Pulse 86  Temp(Src) 97.7 F (36.5 C) (Oral)  Resp 18  Ht 5\' 8"  (1.727 m)  Wt 350 lb (158.759 kg)  BMI 53.23 kg/m2  SpO2 95% Physical Exam  Constitutional: He is oriented to person, place, and time. He appears well-developed and well-nourished.  No distress.  Moderately obese African-American male appears to be in no acute distress, nontoxic in appearance, talking on the phone.  HENT:  Head: Atraumatic.  Right Ear: External ear normal.  Left Ear: External ear normal.  Nose: Nose normal.  Mouth/Throat: Oropharynx is clear and moist.  Mouth: Moderate dental decay noted throughout most significant to right upper mandible region. Tenderness along gumline adjacent to tooth #4 and 5 without any abscess amenable to drainage. No trismus noted. Throat otherwise clear uvula midline, no deep tissue infection.  Eyes: Conjunctivae are normal.  Neck: Normal range of motion. Neck supple.  No nuchal rigidity  Cardiovascular: Normal rate and regular rhythm.   Pulmonary/Chest: Effort normal and breath sounds normal.  Abdominal: Soft. There is no tenderness.  Neurological: He is alert and oriented to person, place, and time.  Skin: No rash noted.  Psychiatric: He has a normal mood and affect.    ED Course  Procedures (including critical care time)  Patient presents with recurrent dental pain likely secondary to dental decay. No obvious abscess amenable for drainage at this time. He has a significant cardiac history however this is likely not related to it. No active chest pain or shortness of breath. Patient will benefit by follow-up  with an oral surgeon for further management of his recurrent dental pain. Patient will be discharged with antibiotic and a short course of pain medication. He cannot take NSAIDs due to currently been on Plavix.  Labs Review Labs Reviewed - No data to display  Imaging Review No results found.   EKG Interpretation None      MDM   Final diagnoses:  Pain due to dental caries    BP 137/79 mmHg  Pulse 86  Temp(Src) 97.7 F (36.5 C) (Oral)  Resp 18  Ht 5\' 8"  (1.727 m)  Wt 350 lb (158.759 kg)  BMI 53.23 kg/m2  SpO2 95%     Domenic Moras, PA-C 01/08/15 [id number]  Everlene Balls, MD 01/08/15 1620

## 2015-01-08 NOTE — ED Notes (Signed)
Patient reports he has tooth pain with multiple teeth since Friday.  Headache as well. A/O, able to speak clearly, no swelling noted. Poor dentition.

## 2015-02-16 ENCOUNTER — Emergency Department (HOSPITAL_COMMUNITY): Payer: BC Managed Care – PPO

## 2015-02-16 ENCOUNTER — Encounter (HOSPITAL_COMMUNITY): Payer: Self-pay | Admitting: *Deleted

## 2015-02-16 ENCOUNTER — Emergency Department (HOSPITAL_COMMUNITY)
Admission: EM | Admit: 2015-02-16 | Discharge: 2015-02-16 | Disposition: A | Discharge status: Home / Self Care | Payer: BC Managed Care – PPO | Attending: Emergency Medicine | Admitting: Emergency Medicine

## 2015-02-16 DIAGNOSIS — Z7982 Long term (current) use of aspirin: Secondary | ICD-10-CM | POA: Insufficient documentation

## 2015-02-16 DIAGNOSIS — S3992XA Unspecified injury of lower back, initial encounter: Secondary | ICD-10-CM | POA: Insufficient documentation

## 2015-02-16 DIAGNOSIS — K219 Gastro-esophageal reflux disease without esophagitis: Secondary | ICD-10-CM | POA: Insufficient documentation

## 2015-02-16 DIAGNOSIS — Y998 Other external cause status: Secondary | ICD-10-CM | POA: Insufficient documentation

## 2015-02-16 DIAGNOSIS — I252 Old myocardial infarction: Secondary | ICD-10-CM | POA: Insufficient documentation

## 2015-02-16 DIAGNOSIS — Z8709 Personal history of other diseases of the respiratory system: Secondary | ICD-10-CM | POA: Insufficient documentation

## 2015-02-16 DIAGNOSIS — E119 Type 2 diabetes mellitus without complications: Secondary | ICD-10-CM | POA: Diagnosis not present

## 2015-02-16 DIAGNOSIS — I10 Essential (primary) hypertension: Secondary | ICD-10-CM | POA: Insufficient documentation

## 2015-02-16 DIAGNOSIS — Z792 Long term (current) use of antibiotics: Secondary | ICD-10-CM | POA: Insufficient documentation

## 2015-02-16 DIAGNOSIS — Z8701 Personal history of pneumonia (recurrent): Secondary | ICD-10-CM | POA: Diagnosis not present

## 2015-02-16 DIAGNOSIS — Y9289 Other specified places as the place of occurrence of the external cause: Secondary | ICD-10-CM | POA: Diagnosis not present

## 2015-02-16 DIAGNOSIS — Z9861 Coronary angioplasty status: Secondary | ICD-10-CM | POA: Diagnosis not present

## 2015-02-16 DIAGNOSIS — W010XXA Fall on same level from slipping, tripping and stumbling without subsequent striking against object, initial encounter: Secondary | ICD-10-CM | POA: Diagnosis not present

## 2015-02-16 DIAGNOSIS — W19XXXA Unspecified fall, initial encounter: Secondary | ICD-10-CM

## 2015-02-16 DIAGNOSIS — Z79899 Other long term (current) drug therapy: Secondary | ICD-10-CM | POA: Insufficient documentation

## 2015-02-16 DIAGNOSIS — Z720 Tobacco use: Secondary | ICD-10-CM | POA: Insufficient documentation

## 2015-02-16 DIAGNOSIS — Z7902 Long term (current) use of antithrombotics/antiplatelets: Secondary | ICD-10-CM | POA: Diagnosis not present

## 2015-02-16 DIAGNOSIS — G8929 Other chronic pain: Secondary | ICD-10-CM | POA: Diagnosis not present

## 2015-02-16 DIAGNOSIS — Z9889 Other specified postprocedural states: Secondary | ICD-10-CM | POA: Insufficient documentation

## 2015-02-16 DIAGNOSIS — I25119 Atherosclerotic heart disease of native coronary artery with unspecified angina pectoris: Secondary | ICD-10-CM | POA: Insufficient documentation

## 2015-02-16 DIAGNOSIS — M545 Low back pain, unspecified: Secondary | ICD-10-CM

## 2015-02-16 DIAGNOSIS — E780 Pure hypercholesterolemia: Secondary | ICD-10-CM | POA: Diagnosis not present

## 2015-02-16 DIAGNOSIS — Y9389 Activity, other specified: Secondary | ICD-10-CM | POA: Insufficient documentation

## 2015-02-16 DIAGNOSIS — M7918 Myalgia, other site: Secondary | ICD-10-CM

## 2015-02-16 DIAGNOSIS — M62830 Muscle spasm of back: Secondary | ICD-10-CM

## 2015-02-16 MED ORDER — OXYCODONE-ACETAMINOPHEN 5-325 MG PO TABS: 1.0000 | ORAL_TABLET | Freq: Four times a day (QID) | ORAL | Status: DC | PRN

## 2015-02-16 MED ORDER — NAPROXEN 500 MG PO TABS: 250.0000 mg | ORAL_TABLET | Freq: Two times a day (BID) | ORAL | Status: DC | PRN

## 2015-02-16 MED ORDER — CYCLOBENZAPRINE HCL 10 MG PO TABS: 10.0000 mg | ORAL_TABLET | Freq: Three times a day (TID) | ORAL | Status: DC | PRN

## 2015-02-16 MED ORDER — OXYCODONE-ACETAMINOPHEN 5-325 MG PO TABS
1.0000 | ORAL_TABLET | Freq: Once | ORAL | Status: AC
  Administered 2015-02-16: 1 via ORAL
  Filled 2015-02-16: qty 1

## 2015-02-16 NOTE — Discharge Instructions (Signed)
Back Pain:  Your back pain should be treated with medicines such as ibuprofen or aleve and this back pain should get better over the next 2 weeks.  However if you develop severe or worsening pain, low back pain with fever, numbness, weakness or inability to walk or urinate, you should return to the ER immediately.  Please follow up with your doctor this week for a recheck if still having symptoms. Avoid heavy lifting for 2 weeks, nothing over 10 pounds.  Low back pain is discomfort in the lower back that may be due to injuries to muscles and ligaments around the spine.  Occasionally, it may be caused by a a problem to a part of the spine called a disc.  The pain may last several days or a week;  However, most patients get completely well in 4 weeks.  Self - care:  The application of heat can help soothe the pain.  Maintaining your daily activities, including walking, is encourged, as it will help you get better faster than just staying in bed. Perform gentle stretching as discussed. Drink plenty of fluids.  Medications are also useful to help with pain control.  A commonly prescribed medication includes percocet.  Do not drive or operate heavy machinery while taking this medication.  Non steroidal anti inflammatory medications including Ibuprofen and naproxen;  These medications help both pain and swelling and are very useful in treating back pain.  They should be taken with food, as they can cause stomach upset, and more seriously, stomach bleeding.    Muscle relaxants:  These medications can help with muscle tightness that is a cause of lower back pain.  Most of these medications can cause drowsiness, and it is not safe to drive or use dangerous machinery while taking them.  SEEK IMMEDIATE MEDICAL ATTENTION IF: New numbness, tingling, weakness, or problem with the use of your arms or legs.  Severe back pain not relieved with medications.  Difficulty with or loss of control of your bowel or bladder  control.  Increasing pain in any areas of the body (such as chest or abdominal pain).  Shortness of breath, dizziness or fainting.  Nausea (feeling sick to your stomach), vomiting, fever, or sweats.  You will need to follow up with  Your primary healthcare provider in 1-2 weeks for reassessment.   Back Pain, Adult Back pain is very common. The pain often gets better over time. The cause of back pain is usually not dangerous. Most people can learn to manage their back pain on their own.  HOME CARE   Stay active. Start with short walks on flat ground if you can. Try to walk farther each day.  Do not sit, drive, or stand in one place for more than 30 minutes. Do not stay in bed.  Do not avoid exercise or work. Activity can help your back heal faster.  Be careful when you bend or lift an object. Bend at your knees, keep the object close to you, and do not twist.  Sleep on a firm mattress. Lie on your side, and bend your knees. If you lie on your back, put a pillow under your knees.  Only take medicines as told by your doctor.  Put ice on the injured area.  Put ice in a plastic bag.  Place a towel between your skin and the bag.  Leave the ice on for 15-20 minutes, 03-04 times a day for the first 2 to 3 days. After that, you can  switch between ice and heat packs.  Ask your doctor about back exercises or massage.  Avoid feeling anxious or stressed. Find good ways to deal with stress, such as exercise. GET HELP RIGHT AWAY IF:   Your pain does not go away with rest or medicine.  Your pain does not go away in 1 week.  You have new problems.  You do not feel well.  The pain spreads into your legs.  You cannot control when you poop (bowel movement) or pee (urinate).  Your arms or legs feel weak or lose feeling (numbness).  You feel sick to your stomach (nauseous) or throw up (vomit).  You have belly (abdominal) pain.  You feel like you may pass out (faint). MAKE SURE YOU:     Understand these instructions.  Will watch your condition.  Will get help right away if you are not doing well or get worse. Document Released: 04/26/2008 Document Revised: 01/31/2012 Document Reviewed: 03/12/2014 [facility] Patient Information 2015 [place], [place]. This information is not intended to replace advice given to you by your health care provider. Make sure you discuss any questions you have with your health care provider.  Back Injury Prevention The following tips can help you to prevent a back injury. PHYSICAL FITNESS  Exercise often. Try to develop strong stomach (abdominal) muscles.  Do aerobic exercises often. This includes walking, jogging, biking, swimming.  Do exercises that help with balance and strength often. This includes tai chi and yoga.  Stretch before and after you exercise.  Keep a healthy weight. DIET   Ask your doctor how much calcium and vitamin D you need every day.  Include calcium in your diet. Foods high in calcium include dairy products; green, leafy vegetables; and products with calcium added (fortified).  Include vitamin D in your diet. Foods high in vitamin D include milk and products with vitamin D added.  Think about taking a multivitamin or other nutritional products called " supplements."  Stop smoking if you smoke. POSTURE   Sit and stand up straight. Avoid leaning forward or hunching over.  Choose chairs that support your lower back.  If you work at a desk:  Sit close to your work so you do not lean over.  Keep your chin tucked in.  Keep your neck drawn back.  Keep your elbows bent at a right angle. Your arms should look like the letter "L."  Sit high and close to the steering wheel when you drive. Add low back support to your car seat if needed.  Avoid sitting or standing in one position for too long. Get up and move around every hour. Take breaks if you are driving for a long time.  Sleep on your side with your knees  slightly bent. You can also sleep on your back with a pillow under your knees. Do not sleep on your stomach. LIFTING, TWISTING, AND REACHING  Avoid heavy lifting, especially lifting over and over again. If you must do heavy lifting:  Stretch before lifting.  Work slowly.  Rest between lifts.  Use carts and dollies to move objects when possible.  Make several small trips instead of carrying 1 heavy load.  Ask for help when you need it.  Ask for help when moving big, awkward objects.  Follow these steps when lifting:  Stand with your feet shoulder-width apart.  Get as close to the object as you can. Do not pick up heavy objects that are far from your body.  Use handles  or lifting straps when possible.  Bend at your knees. Squat down, but keep your heels off the floor.  Keep your shoulders back, your chin tucked in, and your back straight.  Lift the object slowly. Tighten the muscles in your legs, stomach, and butt. Keep the object as close to the center of your body as possible.  Reverse these directions when you put a load down.  Do not:  Lift the object above your waist.  Twist at the waist while lifting or carrying a load. Move your feet if you need to turn, not your waist.  Bend over without bending at your knees.  Avoid reaching over your head, across a table, or for an object on a high surface. OTHER TIPS  Avoid wet floors and keep sidewalks clear of ice.  Do not sleep on a mattress that is too soft or too hard.  Keep items that you use often within easy reach.  Put heavier objects on shelves at waist level. Put lighter objects on lower or higher shelves.  Find ways to lessen your stress. You can try exercise, massage, or relaxation.  Get help for depression or anxiety if needed. GET HELP IF:  You injure your back.  You have questions about diet, exercise, or other ways to prevent back injuries. MAKE SURE YOU:  Understand these instructions.  Will  watch your condition.  Will get help right away if you are not doing well or get worse. Document Released: 04/26/2008 Document Revised: 01/31/2012 Document Reviewed: 12/20/2011 [facility] Patient Information 2015 [place], [place]. This information is not intended to replace advice given to you by your health care provider. Make sure you discuss any questions you have with your health care provider.  Back Exercises Back exercises help treat and prevent back injuries. The goal is to increase your strength in your belly (abdominal) and back muscles. These exercises can also help with flexibility. Start these exercises when told by your doctor. HOME CARE Back exercises include: Pelvic Tilt.  Lie on your back with your knees bent. Tilt your pelvis until the lower part of your back is against the floor. Hold this position 5 to 10 sec. Repeat this exercise 5 to 10 times. Knee to Chest.  Pull 1 knee up against your chest and hold for 20 to 30 seconds. Repeat this with the other knee. This may be done with the other leg straight or bent, whichever feels better. Then, pull both knees up against your chest. Sit-Ups or Curl-Ups.  Bend your knees 90 degrees. Start with tilting your pelvis, and do a partial, slow sit-up. Only lift your upper half 30 to 45 degrees off the floor. Take at least 2 to 3 seonds for each sit-up. Do not do sit-ups with your knees out straight. If partial sit-ups are difficult, simply do the above but with only tightening your belly (abdominal) muscles and holding it as told. Hip-Lift.  Lie on your back with your knees flexed 90 degrees. Push down with your feet and shoulders as you raise your hips 2 inches off the floor. Hold for 10 seconds, repeat 5 to 10 times. Back Arches.  Lie on your stomach. Prop yourself up on bent elbows. Slowly press on your hands, causing an arch in your low back. Repeat 3 to 5 times. Shoulder-Lifts.  Lie face down with arms beside your body. Keep hips  and belly pressed to floor as you slowly lift your head and shoulders off the floor. Do not overdo your  exercises. Be careful in the beginning. Exercises may cause you some mild back discomfort. If the pain lasts for more than 15 minutes, stop the exercises until you see your doctor. Improvement with exercise for back problems is slow.  Document Released: 12/11/2010 Document Revised: 01/31/2012 Document Reviewed: 09/09/2011 [facility] Patient Information 2015 [place], [place]. This information is not intended to replace advice given to you by your health care provider. Make sure you discuss any questions you have with your health care provider.  Heat Therapy Heat therapy can help make painful, stiff muscles and joints feel better. Do not use heat on new injuries. Wait at least 48 hours after an injury to use heat. Do not use heat when you have aches or pains right after an activity. If you still have pain 3 hours after stopping the activity, then you may use heat. HOME CARE Wet heat pack  Soak a clean towel in warm water. Squeeze out the extra water.  Put the warm, wet towel in a plastic bag.  Place a thin, dry towel between your skin and the bag.  Put the heat pack on the area for 5 minutes, and check your skin. Your skin may be pink, but it should not be red.  Leave the heat pack on the area for 15 to 30 minutes.  Repeat this every 2 to 4 hours while awake. Do not use heat while you are sleeping. Warm water bath  Fill a tub with warm water.  Place the affected body part in the tub.  Soak the area for 20 to 40 minutes.  Repeat as needed. Hot water bottle  Fill the water bottle half full with hot water.  Press out the extra air. Close the cap tightly.  Place a dry towel between your skin and the bottle.  Put the bottle on the area for 5 minutes, and check your skin. Your skin may be pink, but it should not be red.  Leave the bottle on the area for 15 to 30 minutes.  Repeat this  every 2 to 4 hours while awake. Electric heating pad  Place a dry towel between your skin and the heating pad.  Set the heating pad on low heat.  Put the heating pad on the area for 10 minutes, and check your skin. Your skin may be pink, but it should not be red.  Leave the heating pad on the area for 20 to 40 minutes.  Repeat this every 2 to 4 hours while awake.  Do not lie on the heating pad.  Do not fall asleep while using the heating pad.  Do not use the heating pad near water. GET HELP RIGHT AWAY IF:  You get blisters or red skin.  Your skin is puffy (swollen), or you lose feeling (numbness) in the affected area.  You have any new problems.  Your problems are getting worse.  You have any questions or concerns. If you have any problems, stop using heat therapy until you see your doctor. MAKE SURE YOU:  Understand these instructions.  Will watch your condition.  Will get help right away if you are not doing well or get worse. Document Released: 01/31/2012 Document Reviewed: 01/01/2014 [facility] Patient Information 2015 [place]. This information is not intended to replace advice given to you by your health care provider. Make sure you discuss any questions you have with your health care provider.

## 2015-02-16 NOTE — ED Provider Notes (Signed)
CSN: [id number]     Arrival date & time 02/16/15  0550 History   First MD Initiated Contact with Patient 02/16/15 [phone]     Chief Complaint  Patient presents with  . Back Pain     (Consider location/radiation/quality/duration/timing/severity/associated sxs/prior Treatment) HPI Comments: ULYSEES ROBARTS is a 49 y.o. male with a PMHx of chronic back pain, MI, CAD, DM2, HTN, GERD, and chronic headaches, who presents to the ED with complaints of low back/SI region pain that began last night at 3:30 AM when he slipped and fell backwards after tripping on a cord, causing him to land on his buttocks. Pain is 8/10 sharp constant pain in the SI joint/low back region, radiating into the R buttock, worse with walking, and improved somewhat with warmth and tylenol. Pt denies fevers, chills, CP, SOB, abd pain, N/V, hematuria, dysuria, incontinence, cauda equina sxs, myalgias, arthralgias, weakness, numbness, paresthesias, or bruising/swelling. No hx of CA or IVDU.  Patient is a 49 y.o. male presenting with back pain. The history is provided by the patient. No language interpreter was used.  Back Pain Location:  Sacro-iliac joint, lumbar spine and gluteal region Quality: sharp. Radiates to:  Does not radiate Pain severity:  Moderate Pain is:  Same all the time Onset quality:  Sudden Duration:  3 hours Timing:  Constant Progression:  Unchanged Chronicity:  New Context: falling   Relieved by:  Heating pad and OTC medications (tylenol) Worsened by:  Ambulation Ineffective treatments:  None tried Associated symptoms: no abdominal pain, no bladder incontinence, no bowel incontinence, no chest pain, no dysuria, no fever, no leg pain, no numbness, no paresthesias, no pelvic pain, no perianal numbness, no tingling and no weakness   Risk factors: no hx of cancer     Past Medical History  Diagnosis Date  . Myocardial infarction     2002; treated with stent to mid L circumflex  . Heart disease   .  Hypercholesteremia   . Coronary artery disease   . NSTEMI (non-ST elevated myocardial infarction) 10/18/2011  . CAD (coronary artery disease), native coronary artery 04/18/2008  . Diabetes mellitus     Takes Metformin  . Hypertension     Takes Lisinopril   . GERD (gastroesophageal reflux disease)     Takes Dexilant  . Angina     Takes Isosorbide & Nitroglycerin  . Headache(784.0)   . Pneumonia     approx 2 years ago  . Bronchitis     hx of  . Shortness of breath     Occurs while laying down at times  . History of nuclear stress test 07/29/2011    decreased activity in septum on stress images compared with rest; wall motion abnormality, EF 53%  . Rhabdomyolysis     h/o, r/t statins  . H/O cardiac catheterization     with 5 separate dated interventions  . Chronic leg pain     bilateral  . Chronic back pain    Past Surgical History  Procedure Laterality Date  . Coronary angioplasty with stent placement  10/09/2001    PTCA & stenting of mid AV circumflex; 2.5x26mm Pixel stent  . Colonoscopy w/ polypectomy    . Lumbar laminectomy/decompression microdiscectomy  03/31/2012    Procedure: LUMBAR LAMINECTOMY/DECOMPRESSION MICRODISCECTOMY 1 LEVEL;  Surgeon: Charlie Pitter, MD;  Location: [facility];  Service: Neurosurgery;  Laterality: Left;  . Transthoracic echocardiogram  07/28/2011    EF 55-65%; LVH, grade 1 diastolic dysfunction;   . Coronary angioplasty  with stent placement  12/13/2001    PCI with stent to mid L circumflex, 95% stenosis to 0% residual  . Coronary angioplasty with stent placement  10/10/2003    PCI to mid AV circumflex; LAD 30% disease; RCA 100% occluded prox.  . Coronary angioplasty with stent placement  09/01/2011    PCI with stenting with bare metal stent to mid AV groove circumflex and PDA  . Coronary angioplasty with stent placement  10/17/2011    cutting balloon angioplasty of ostial lateral OM1 branch and bifurcation AV groove circumflex OM junction; stenosis  reduced to 0%  . Left heart catheterization with coronary angiogram N/A 10/18/2011    Procedure: LEFT HEART CATHETERIZATION WITH CORONARY ANGIOGRAM;  Surgeon: Leonie Man, MD;  Location: [facility];  Service: Cardiovascular;  Laterality: N/A;   Family History  Problem Relation Age of Onset  . Anesthesia problems Neg Hx   . Hypotension Neg Hx   . Malignant hyperthermia Neg Hx   . Pseudochol deficiency Neg Hx   . Hypertension Mother   . Heart attack Father   . Diabetes Mother   . Heart disease Brother     x 3   . Heart attack Brother     deceased  . Hypertension Sister   . Diabetes Sister    History  Substance Use Topics  . Smoking status: Current Some Day Smoker -- 25 years    Types: Cigarettes  . Smokeless tobacco: Never Used     Comment: 1 pack lasts about 2 weeks  . Alcohol Use: No    Review of Systems  Constitutional: Negative for fever and chills.  Respiratory: Negative for shortness of breath.   Cardiovascular: Negative for chest pain.  Gastrointestinal: Negative for nausea, vomiting, abdominal pain and bowel incontinence.  Genitourinary: Negative for bladder incontinence, dysuria, hematuria, flank pain and pelvic pain.  Musculoskeletal: Positive for back pain. Negative for myalgias, arthralgias and neck pain.  Skin: Negative for color change.  Neurological: Negative for tingling, weakness, numbness and paresthesias.  Psychiatric/Behavioral: Negative for confusion.   10 Systems reviewed and are negative for acute change except as noted in the HPI.    Allergies  Iohexol and Vicodin  Home Medications   Prior to Admission medications   Medication Sig Start Date End Date Taking? Authorizing Provider  acetaminophen (TYLENOL) 500 MG tablet Take 1,000 mg by mouth every 6 (six) hours as needed for headache.    Historical Provider, MD  aspirin 325 MG tablet Take 325 mg by mouth daily.    Historical Provider, MD  azithromycin (ZITHROMAX) 250 MG tablet Take 1 tablet  (250 mg total) by mouth daily. Take first 2 tablets together, then 1 every day until finished. Patient not taking: Reported on 01/08/2015 11/05/14   Fransico Meadow, PA-C  clopidogrel (PLAVIX) 75 MG tablet Take 1 tablet (75 mg total) by mouth daily. 10/25/14   Pixie Casino, MD  dexlansoprazole (DEXILANT) 60 MG capsule Take 60 mg by mouth daily.    Historical Provider, MD  isosorbide mononitrate (IMDUR) 30 MG 24 hr tablet Take 1 tablet (30 mg total) by mouth 2 (two) times daily. 10/25/14   Pixie Casino, MD  lisinopril (PRINIVIL,ZESTRIL) 20 MG tablet Take 1 tablet (20 mg total) by mouth daily. 10/25/14   Pixie Casino, MD  metFORMIN (GLUCOPHAGE) 1000 MG tablet Take 1,000 mg by mouth 2 (two) times daily with a meal.  10/19/11   Brett Canales, PA-C  metoprolol (LOPRESSOR) 50  MG tablet Take 1 tablet (50 mg total) by mouth 2 (two) times daily. 10/25/14   Pixie Casino, MD  nitroGLYCERIN (NITROSTAT) 0.4 MG SL tablet Place 1 tablet (0.4 mg total) under the tongue every 5 (five) minutes as needed for chest pain. 10/25/14   Pixie Casino, MD  oxyCODONE-acetaminophen (PERCOCET/ROXICET) 5-325 MG per tablet Take 2 tablets by mouth every 4 (four) hours as needed for moderate pain or severe pain. Patient not taking: Reported on 01/08/2015 11/05/14   Fransico Meadow, PA-C  penicillin v potassium (VEETID) 500 MG tablet Take 1 tablet (500 mg total) by mouth 3 (three) times daily. 01/08/15   Domenic Moras, PA-C  simvastatin (ZOCOR) 20 MG tablet Take 1 tablet (20 mg total) by mouth at bedtime. 10/25/14   Pixie Casino, MD  traMADol (ULTRAM) 50 MG tablet Take 1 tablet (50 mg total) by mouth every 6 (six) hours as needed. 01/08/15   Domenic Moras, PA-C  valsartan-hydrochlorothiazide (DIOVAN-HCT) 320-12.5 MG per tablet Take 1 tablet by mouth daily. 10/25/14   Pixie Casino, MD   BP 151/88 mmHg  Pulse 86  Temp(Src) 98.2 F (36.8 C)  Resp 18  SpO2 98% Physical Exam  Constitutional: He is oriented to person, place, and  time. Vital signs are normal. He appears well-developed and well-nourished.  Non-toxic appearance. No distress.  Afebrile, nontoxic, NAD  HENT:  Head: Normocephalic and atraumatic.  Mouth/Throat: Mucous membranes are normal.  Eyes: Conjunctivae and EOM are normal. Right eye exhibits no discharge. Left eye exhibits no discharge.  Neck: Normal range of motion. Neck supple. No spinous process tenderness and no muscular tenderness present. No rigidity. Normal range of motion present.  FROM intact without spinous process or paraspinous muscle TTP, no bony stepoffs or deformities, no muscle spasms.  Cardiovascular: Normal rate and intact distal pulses.   Pulmonary/Chest: Effort normal. No respiratory distress.  Abdominal: Normal appearance. He exhibits no distension.  Musculoskeletal:       Right hip: Normal.       Left hip: Normal.       Lumbar back: He exhibits decreased range of motion (slightly, due to pain), tenderness, bony tenderness (over sacrum/SI joint) and spasm. He exhibits no swelling and no deformity.       Back:  Lumbar spine with slightly diminished ROM due to pain, with no focal spinous process TTP, no bony stepoffs or deformities, with TTP over sacrum/SI joints, mild R sided paraspinous muscle TTP and muscle spasms. Strength 5/5 in all extremities, sensation grossly intact in all extremities, negative SLR bilaterally, gait mildly antalgic. No overlying skin changes. Distal pulses intact. Hips nonTTP with FROM intact.  Neurological: He is alert and oriented to person, place, and time. He has normal strength. No sensory deficit.  Skin: Skin is warm, dry and intact. No rash noted.  Psychiatric: He has a normal mood and affect.  Nursing note and vitals reviewed.   ED Course  Procedures (including critical care time) Labs Review Labs Reviewed - No data to display  Imaging Review Dg Lumbar Spine Complete  02/16/2015   CLINICAL DATA:  Lumbago with right buttocks region radicular  symptoms following fall 1 day prior  EXAM: LUMBAR SPINE - COMPLETE 4+ VIEW  COMPARISON:  July 13, 2014  FINDINGS: Frontal, lateral, spot lumbosacral lateral, and bilateral oblique views were obtained. There are 5 non-rib-bearing lumbar type vertebral bodies. There is no fracture or spondylolisthesis. Disc spaces appear intact. Anterior and lateral osteophytes are again noted, primarily  at L3 and L4. There is no appreciable facet arthropathy. There are scattered foci of arterial vascular calcification in the distal aorta and iliac arteries.  IMPRESSION: Stable anterior and lateral osteophytes at L3 and L4. No appreciable disc space narrowing or facet arthropathic change. No fracture or spondylolisthesis. Areas of atherosclerotic arterial calcification.   Electronically Signed   By: Lowella Grip III M.D.   On: 02/16/2015 08:07   Dg Sacrum/coccyx  02/16/2015   CLINICAL DATA:  Pain following fall 1 day prior  EXAM: SACRUM AND COCCYX - 2+ VIEW  COMPARISON:  None.  FINDINGS: Frontal and lateral views were obtained. No fracture or diastases. Joint spaces appear intact. No appreciable sacroiliitis.  IMPRESSION: No fracture or diastases.  No appreciable arthropathic change.   Electronically Signed   By: Lowella Grip III M.D.   On: 02/16/2015 08:09     EKG Interpretation None      MDM   Final diagnoses:  Low back pain  Gluteal pain  Fall, initial encounter  Muscle spasm of back    49 y.o. male with back pain after he slipped and fell backwards onto his buttock. No red flag s/s of low back pain. No s/s of central cord compression or cauda equina. Lower extremities are neurovascularly intact and patient is ambulatory although slightly antalgic. Given the trauma/fall, and midline tenderness, will proceed with imaging of lumbar spine/sacrum/coccyx. Will give pain control and reassess.  8:19 AM Xrays neg. Patient was counseled on back pain precautions and told to do activity as tolerated but do  not lift, push, or pull heavy objects more than 10 pounds for the next week. Patient counseled to use ice or heat on back for no longer than 15 minutes every hour. Rx given for muscle relaxer and counseled on proper use of muscle relaxant medication. Rx given for narcotic pain medicine and counseled on proper use of narcotic pain medications. Told that they can increase to every 4 hrs if needed while pain is worse. Counseled not to combine this medication with others containing tylenol. Urged patient not to drink alcohol, drive, or perform any other activities that requires focus while taking either of these medications.   Patient urged to follow-up with PCP if pain does not improve with treatment and rest or if pain becomes recurrent. Urged to return with worsening severe pain, loss of bowel or bladder control, trouble walking. The patient verbalizes understanding and agrees with the plan.   BP 151/88 mmHg  Pulse 74  Temp(Src) 98.2 F (36.8 C)  Resp 18  SpO2 95%  Meds ordered this encounter  Medications  . oxyCODONE-acetaminophen (PERCOCET/ROXICET) 5-325 MG per tablet 1 tablet    Sig:   . oxyCODONE-acetaminophen (PERCOCET) 5-325 MG per tablet    Sig: Take 1 tablet by mouth every 6 (six) hours as needed for severe pain.    Dispense:  6 tablet    Refill:  0    Order Specific Question:  Supervising Provider    Answer:  MILLER, BRIAN [[id number]]  . cyclobenzaprine (FLEXERIL) 10 MG tablet    Sig: Take 1 tablet (10 mg total) by mouth 3 (three) times daily as needed for muscle spasms.    Dispense:  15 tablet    Refill:  0    Order Specific Question:  Supervising Provider    Answer:  MILLER, BRIAN [[id number]]  . naproxen (NAPROSYN) 500 MG tablet    Sig: Take 0.5 tablets (250 mg total) by mouth 2 (two) times daily  as needed for mild pain or moderate pain (TAKE WITH MEALS.).    Dispense:  10 tablet    Refill:  0    Order Specific Question:  Supervising Provider    Answer:  Noemi Chapel [[id number]]      Sevan Mcbroom Camprubi-Soms, PA-C 02/16/15 0820  Everlene Balls, MD 02/16/15 1448

## 2015-02-16 NOTE — ED Notes (Signed)
Pt transported to xray 

## 2015-02-16 NOTE — ED Notes (Signed)
Pt returned to exam room. States 5/10 back pain. Vital signs stable. No signs of acute distress noted at the time.

## 2015-02-16 NOTE — ED Notes (Signed)
The pt is c/o lower back pain after he fell earlier tonight.  He has back problems  chronically

## 2015-03-17 ENCOUNTER — Telehealth: Payer: Self-pay | Admitting: Internal Medicine

## 2015-03-17 ENCOUNTER — Telehealth: Payer: Self-pay | Admitting: Diagnostic Radiology

## 2015-03-17 MED ORDER — VALSARTAN-HYDROCHLOROTHIAZIDE 320-12.5 MG PO TABS: 1.0000 | ORAL_TABLET | Freq: Every day | ORAL | Status: DC

## 2015-03-17 MED ORDER — LISINOPRIL 20 MG PO TABS: 20.0000 mg | ORAL_TABLET | Freq: Every day | ORAL | Status: DC

## 2015-03-17 MED ORDER — SIMVASTATIN 20 MG PO TABS: 20.0000 mg | ORAL_TABLET | Freq: Every day | ORAL | Status: DC

## 2015-03-17 MED ORDER — CLOPIDOGREL BISULFATE 75 MG PO TABS: 75.0000 mg | ORAL_TABLET | Freq: Every day | ORAL | Status: DC

## 2015-03-17 MED ORDER — ISOSORBIDE MONONITRATE ER 30 MG PO TB24: 30.0000 mg | ORAL_TABLET | Freq: Two times a day (BID) | ORAL | Status: DC

## 2015-03-17 NOTE — Telephone Encounter (Signed)
Hit the wrong provider by mistake

## 2015-03-17 NOTE — Telephone Encounter (Signed)
°  1. Which medications need to be refilled? Generic Plavix,Isosorbide,Simvastatin,Lisinopril and Valsartan-and other medicine that Dr Debara Pickett prescribed. new prescriptions and new pharmacy  2. Which pharmacy is medication to be sent to?[phone]  3. Do they need a 30 day or 90 day supply? 90 and refills  4. Would they like a call back once the medication has been sent to the pharmacy? yes

## 2015-03-17 NOTE — Telephone Encounter (Signed)
Refill submitted to patient's preferred pharmacy. Informed patient. Pt voiced understanding.   Also stated wanted to arrange appt - asked for callback d/t being on other line. Will send note to scheduling.

## 2015-03-25 ENCOUNTER — Telehealth: Payer: Self-pay | Admitting: Internal Medicine

## 2015-03-25 ENCOUNTER — Encounter (HOSPITAL_COMMUNITY): Payer: Self-pay | Admitting: *Deleted

## 2015-03-25 ENCOUNTER — Emergency Department (HOSPITAL_COMMUNITY): Payer: BC Managed Care – PPO

## 2015-03-25 ENCOUNTER — Emergency Department (HOSPITAL_COMMUNITY)
Admission: EM | Admit: 2015-03-25 | Discharge: 2015-03-25 | Disposition: A | Discharge status: Home / Self Care | Payer: BC Managed Care – PPO | Attending: Emergency Medicine | Admitting: Emergency Medicine

## 2015-03-25 DIAGNOSIS — Z7902 Long term (current) use of antithrombotics/antiplatelets: Secondary | ICD-10-CM | POA: Diagnosis not present

## 2015-03-25 DIAGNOSIS — Z720 Tobacco use: Secondary | ICD-10-CM | POA: Insufficient documentation

## 2015-03-25 DIAGNOSIS — Z8709 Personal history of other diseases of the respiratory system: Secondary | ICD-10-CM | POA: Insufficient documentation

## 2015-03-25 DIAGNOSIS — Z8739 Personal history of other diseases of the musculoskeletal system and connective tissue: Secondary | ICD-10-CM | POA: Diagnosis not present

## 2015-03-25 DIAGNOSIS — Z955 Presence of coronary angioplasty implant and graft: Secondary | ICD-10-CM | POA: Diagnosis not present

## 2015-03-25 DIAGNOSIS — I252 Old myocardial infarction: Secondary | ICD-10-CM | POA: Diagnosis not present

## 2015-03-25 DIAGNOSIS — Z7982 Long term (current) use of aspirin: Secondary | ICD-10-CM | POA: Diagnosis not present

## 2015-03-25 DIAGNOSIS — J189 Pneumonia, unspecified organism: Secondary | ICD-10-CM

## 2015-03-25 DIAGNOSIS — Z8701 Personal history of pneumonia (recurrent): Secondary | ICD-10-CM | POA: Diagnosis not present

## 2015-03-25 DIAGNOSIS — G8929 Other chronic pain: Secondary | ICD-10-CM | POA: Diagnosis not present

## 2015-03-25 DIAGNOSIS — R079 Chest pain, unspecified: Secondary | ICD-10-CM

## 2015-03-25 DIAGNOSIS — I25119 Atherosclerotic heart disease of native coronary artery with unspecified angina pectoris: Secondary | ICD-10-CM | POA: Diagnosis not present

## 2015-03-25 DIAGNOSIS — I10 Essential (primary) hypertension: Secondary | ICD-10-CM | POA: Insufficient documentation

## 2015-03-25 DIAGNOSIS — J159 Unspecified bacterial pneumonia: Secondary | ICD-10-CM | POA: Insufficient documentation

## 2015-03-25 DIAGNOSIS — E780 Pure hypercholesterolemia: Secondary | ICD-10-CM | POA: Insufficient documentation

## 2015-03-25 DIAGNOSIS — Z79899 Other long term (current) drug therapy: Secondary | ICD-10-CM | POA: Diagnosis not present

## 2015-03-25 DIAGNOSIS — K219 Gastro-esophageal reflux disease without esophagitis: Secondary | ICD-10-CM | POA: Insufficient documentation

## 2015-03-25 DIAGNOSIS — E119 Type 2 diabetes mellitus without complications: Secondary | ICD-10-CM | POA: Insufficient documentation

## 2015-03-25 DIAGNOSIS — Z9889 Other specified postprocedural states: Secondary | ICD-10-CM | POA: Diagnosis not present

## 2015-03-25 LAB — CBC
HCT: 43 % (ref 39.0–52.0)
Hemoglobin: 14.3 g/dL (ref 13.0–17.0)
MCH: 26.6 pg (ref 26.0–34.0)
MCHC: 33.3 g/dL (ref 30.0–36.0)
MCV: 80.1 fL (ref 78.0–100.0)
PLATELETS: 238 10*3/uL (ref 150–400)
RBC: 5.37 MIL/uL (ref 4.22–5.81)
RDW: 13.6 % (ref 11.5–15.5)
WBC: 8.3 10*3/uL (ref 4.0–10.5)

## 2015-03-25 LAB — BASIC METABOLIC PANEL
ANION GAP: 10 (ref 5–15)
BUN: 10 mg/dL (ref 6–20)
CO2: 24 mmol/L (ref 22–32)
CREATININE: 1 mg/dL (ref 0.61–1.24)
Calcium: 8.8 mg/dL — ABNORMAL LOW (ref 8.9–10.3)
Chloride: 104 mmol/L (ref 101–111)
GFR calc non Af Amer: >60 mL/min (ref >60)
Glucose, Bld: 91 mg/dL (ref 70–99)
POTASSIUM: 4.3 mmol/L (ref 3.5–5.1)
SODIUM: 138 mmol/L (ref 135–145)

## 2015-03-25 LAB — I-STAT TROPONIN, ED: Troponin i, poc: 0 ng/mL (ref 0.00–0.08)

## 2015-03-25 LAB — BRAIN NATRIURETIC PEPTIDE: B Natriuretic Peptide: 26.9 pg/mL (ref 0.0–100.0)

## 2015-03-25 MED ORDER — AZITHROMYCIN 250 MG PO TABS: 250.0000 mg | ORAL_TABLET | Freq: Every day | ORAL | Status: DC

## 2015-03-25 NOTE — Discharge Instructions (Signed)
Your caregiver has diagnosed you as having chest pain that is not specific for one problem, but does not require admission.  Chest pain comes from many different causes.  SEEK IMMEDIATE MEDICAL ATTENTION IF: You have severe chest pain, especially if the pain is crushing or pressure-like and spreads to the arms, back, neck, or jaw, or if you have sweating, nausea (feeling sick to your stomach), or shortness of breath. THIS IS AN EMERGENCY. Don't wait to see if the pain will go away. Get medical help at once. Call 911 or [phone] (operator). DO NOT drive yourself to the hospital.  Your chest pain gets worse and does not go away with rest.  You have an attack of chest pain lasting longer than usual, despite rest and treatment with the medications your caregiver has prescribed.  You wake from sleep with chest pain or shortness of breath.  You feel dizzy or faint.  You have chest pain not typical of your usual pain for which you originally saw your caregiver.  PLEASE HAVE CAT SCAN OF YOUR CHEST IN ONE MONTH TO LOOK FOR ANY ABNORMALITIES IN YOUR LUNGS.

## 2015-03-25 NOTE — ED Notes (Signed)
PT states left sided chest pain that started on Friday and no radiation.  Pt states feels the same as when he last had MI.  Pt reports some sob

## 2015-03-25 NOTE — Telephone Encounter (Signed)
Andrew West is having some discomfort in the middle of his chest . Been going on since Friday , feeling bad and weak. Please call   Thanks

## 2015-03-25 NOTE — ED Notes (Signed)
Pt states that his left arm hurts - states that it only hurts when he moves it. Concerned he may have pulled a muscle.

## 2015-03-25 NOTE — ED Provider Notes (Signed)
CSN: [id number]     Arrival date & time 03/25/15  1814 History  This chart was scribed for non-physician practitioner, Monico Blitz, PA-C, working with Ripley Fraise, MD, by Delphia Grates, ED Scribe. This patient was seen in room D30C/D30C and the patient's care was started at 11:09 PM.   Chief Complaint  Patient presents with  . Chest Pain    Patient is a 49 y.o. male presenting with arm injury. The history is provided by the patient. No language interpreter was used.  Arm Injury Location:  Arm Time since incident:  4 days Arm location:  L arm Pain details:    Radiates to:  L shoulder and chest   Pain severity now: 8/10.   Onset quality:  Sudden   Duration:  4 days   Timing:  Intermittent   Progression:  Unchanged Chronicity:  New Dislocation: no   Worsened by:  Movement Associated symptoms: no fever and no swelling      HPI Comments: Andrew West is a 49 y.o. male, with history of DM, MI, and CAD who presents to the Emergency Department complaining of intermittent, moderate, left arm pain with radiation to left chest that began 4 days ago. Patient states he recently moved 4 days ago and noticed left arm pain. Patient states the pain only occurs with movement and suspects he may have pulled a muscle. He states he contacted his cardiologist and was informed to come to the ED for imaging and further evaluation. Patient reports generalized weekend since onset of pain, but suspects this may be related to moving. He notes mild SOB earlier today, but denies any at this time or with exertion. He also mentions a mild cough. Patient has history of MI, but states this does not feel similar. He denies nausea, vomiting, diaphoresis, abdominal pain, or BLE swelling. Patient is a current smoker.   Past Medical History  Diagnosis Date  . Myocardial infarction     2002; treated with stent to mid L circumflex  . Heart disease   . Hypercholesteremia   . Coronary artery disease   .  NSTEMI (non-ST elevated myocardial infarction) 10/18/2011  . CAD (coronary artery disease), native coronary artery 04/18/2008  . Diabetes mellitus     Takes Metformin  . Hypertension     Takes Lisinopril   . GERD (gastroesophageal reflux disease)     Takes Dexilant  . Angina     Takes Isosorbide & Nitroglycerin  . Headache(784.0)   . Pneumonia     approx 2 years ago  . Bronchitis     hx of  . Shortness of breath     Occurs while laying down at times  . History of nuclear stress test 07/29/2011    decreased activity in septum on stress images compared with rest; wall motion abnormality, EF 53%  . Rhabdomyolysis     h/o, r/t statins  . H/O cardiac catheterization     with 5 separate dated interventions  . Chronic leg pain     bilateral  . Chronic back pain    Past Surgical History  Procedure Laterality Date  . Coronary angioplasty with stent placement  10/09/2001    PTCA & stenting of mid AV circumflex; 2.5x17m Pixel stent  . Colonoscopy w/ polypectomy    . Lumbar laminectomy/decompression microdiscectomy  03/31/2012    Procedure: LUMBAR LAMINECTOMY/DECOMPRESSION MICRODISCECTOMY 1 LEVEL;  Surgeon: HCharlie Pitter MD;  [facility];  Service: Neurosurgery;  Laterality: Left;  . Transthoracic  echocardiogram  07/28/2011    EF 55-65%; LVH, grade 1 diastolic dysfunction;   . Coronary angioplasty with stent placement  12/13/2001    PCI with stent to mid L circumflex, 95% stenosis to 0% residual  . Coronary angioplasty with stent placement  10/10/2003    PCI to mid AV circumflex; LAD 30% disease; RCA 100% occluded prox.  . Coronary angioplasty with stent placement  09/01/2011    PCI with stenting with bare metal stent to mid AV groove circumflex and PDA  . Coronary angioplasty with stent placement  10/17/2011    cutting balloon angioplasty of ostial lateral OM1 branch and bifurcation AV groove circumflex OM junction; stenosis reduced to 0%  . Left heart catheterization with  coronary angiogram N/A 10/18/2011    Procedure: LEFT HEART CATHETERIZATION WITH CORONARY ANGIOGRAM;  Surgeon: Leonie Man, MD;  Location: [facility] CATH LAB;  Service: Cardiovascular;  Laterality: N/A;   Family History  Problem Relation Age of Onset  . Anesthesia problems Neg Hx   . Hypotension Neg Hx   . Malignant hyperthermia Neg Hx   . Pseudochol deficiency Neg Hx   . Hypertension Mother   . Heart attack Father   . Diabetes Mother   . Heart disease Brother     x 3   . Heart attack Brother     deceased  . Hypertension Sister   . Diabetes Sister    History  Substance Use Topics  . Smoking status: Current Some Day Smoker -- 25 years    Types: Cigarettes  . Smokeless tobacco: Never Used     Comment: 1 pack lasts about 2 weeks  . Alcohol Use: No    Review of Systems  Constitutional: Negative for fever and diaphoresis.  Respiratory: Positive for cough and shortness of breath (resolved).   Cardiovascular: Positive for chest pain. Negative for leg swelling.  Gastrointestinal: Negative for nausea, vomiting and abdominal pain.  All other systems reviewed and are negative.     Allergies  Iohexol and Vicodin  Home Medications   Prior to Admission medications   Medication Sig Start Date End Date Taking? Authorizing Provider  acetaminophen (TYLENOL) 500 MG tablet Take 1,000 mg by mouth every 6 (six) hours as needed for headache.    Historical Provider, MD  aspirin 325 MG tablet Take 325 mg by mouth daily.    Historical Provider, MD  azithromycin (ZITHROMAX) 250 MG tablet Take 1 tablet (250 mg total) by mouth daily. Take first 2 tablets together, then 1 every day until finished. 03/25/15   Ripley Fraise, MD  clopidogrel (PLAVIX) 75 MG tablet Take 1 tablet (75 mg total) by mouth daily. 03/17/15   Pixie Casino, MD  cyclobenzaprine (FLEXERIL) 10 MG tablet Take 1 tablet (10 mg total) by mouth 3 (three) times daily as needed for muscle spasms. 02/16/15   Mercedes Camprubi-Soms, PA-C   dexlansoprazole (DEXILANT) 60 MG capsule Take 60 mg by mouth daily.    Historical Provider, MD  isosorbide mononitrate (IMDUR) 30 MG 24 hr tablet Take 1 tablet (30 mg total) by mouth 2 (two) times daily. 03/17/15   Pixie Casino, MD  lisinopril (PRINIVIL,ZESTRIL) 20 MG tablet Take 1 tablet (20 mg total) by mouth daily. 03/17/15   Pixie Casino, MD  metFORMIN (GLUCOPHAGE) 1000 MG tablet Take 1,000 mg by mouth 2 (two) times daily with a meal.  10/19/11   Brett Canales, PA-C  metoprolol (LOPRESSOR) 50 MG tablet Take 1 tablet (50 mg total) by  mouth 2 (two) times daily. 10/25/14   Pixie Casino, MD  naproxen (NAPROSYN) 500 MG tablet Take 0.5 tablets (250 mg total) by mouth 2 (two) times daily as needed for mild pain or moderate pain (TAKE WITH MEALS.). 02/16/15   Mercedes Camprubi-Soms, PA-C  nitroGLYCERIN (NITROSTAT) 0.4 MG SL tablet Place 1 tablet (0.4 mg total) under the tongue every 5 (five) minutes as needed for chest pain. 10/25/14   Pixie Casino, MD  penicillin v potassium (VEETID) 500 MG tablet Take 1 tablet (500 mg total) by mouth 3 (three) times daily. 01/08/15   Domenic Moras, PA-C  simvastatin (ZOCOR) 20 MG tablet Take 1 tablet (20 mg total) by mouth at bedtime. 03/17/15   Pixie Casino, MD  traMADol (ULTRAM) 50 MG tablet Take 1 tablet (50 mg total) by mouth every 6 (six) hours as needed. 01/08/15   Domenic Moras, PA-C  valsartan-hydrochlorothiazide (DIOVAN-HCT) 320-12.5 MG per tablet Take 1 tablet by mouth daily. 03/17/15   Pixie Casino, MD   Triage Vitals: BP 134/88 mmHg  Pulse 80  Temp(Src) 98.7 F (37.1 C) (Oral)  Resp 24  SpO2 96%  Physical Exam CONSTITUTIONAL: Well developed/well nourished HEAD: Normocephalic/atraumatic EYES: EOMI/PERRL ENMT: Mucous membranes moist NECK: supple no meningeal signs SPINE/BACK:entire spine nontender CV: S1/S2 noted, no murmurs/rubs/gallops noted LUNGS: brief crackles in left base. ABDOMEN: soft, nontender, no rebound or guarding, bowel sounds  noted throughout abdomen GU:no cva tenderness NEURO: Pt is awake/alert/appropriate, moves all extremitiesx4.  No facial droop.   EXTREMITIES: pulses normal/equal, full ROM. Tenderness to palpation of left shoulder. Chest wall pain reproducible with movement of left arm. SKIN: warm, color normal PSYCH: no abnormalities of mood noted, alert and oriented to situation  ED Course  Procedures   DIAGNOSTIC STUDIES: Oxygen Saturation is 96% on room air, adequate by my interpretation.    COORDINATION OF CARE: At 2317 Discussed treatment plan with patient which includes ABX. Patient agrees.   Very low suspicion for ACS/PE/Dissection as pain worse with arm movement He has abnormal CXR, possible pneumonia but similar location as prior PNA Advised need to quit smoking Will give another round of azithromycin He needs CT chest in one month to evaluate for any possible tumor Pt/family understands this  Labs Review Labs Reviewed  BASIC METABOLIC PANEL - Abnormal; Notable for the following:    Calcium 8.8 (*)    All other components within normal limits  BRAIN NATRIURETIC PEPTIDE  CBC  I-STAT TROPOININ, ED    Imaging Review Dg Chest 2 View eview 03/25/2015   CLINICAL DATA:  Left-sided chest pain, left arm pain  EXAM: CHEST  2 VIEW  COMPARISON:  11/05/2014  FINDINGS: Left lower lobe airspace disease concerning for pneumonia. No pleural effusion or pneumothorax. Stable cardiomediastinal silhouette. No acute osseous abnormality.  IMPRESSION: Left lower lobe pneumonia. Followup PA and lateral chest X-ray is recommended in 3-4 weeks following trial of antibiotic therapy to ensure resolution and exclude underlying malignancy.   Electronically Signed   By: Kathreen Devoid   On: 03/25/2015 19:21     EKG Interpretation   Date/Time:  Tuesday Mar 25 2015 18:20:55 EDT Ventricular Rate:  83 PR Interval:  182 QRS Duration: 102 QT Interval:  388 QTC Calculation: 455 R Axis:   86 Text Interpretation:   Normal sinus rhythm Normal ECG Confirmed by Christy Gentles   MD, Sergey Ishler ([id number]) on 03/25/2015 11:07:19 PM      MDM   Final diagnoses:  Chest pain, unspecified chest pain  type  CAP (community acquired pneumonia)    Nursing notes including past medical history and social history reviewed and considered in documentation Previous records reviewed and considered xrays/imaging reviewed by myself and considered during evaluation Labs/vital reviewed myself and considered during evaluation    I personally performed the services described in this documentation, which was scribed in my presence. The recorded information has been reviewed and is accurate.      Ripley Fraise, MD 03/26/15 [phone]

## 2015-03-25 NOTE — Telephone Encounter (Signed)
Pt. States he having the same pain he had the last time he had an MI , pt. Instructed to go to the ER @ [facility], Pt. Stated understanding of instructions

## 2015-04-02 ENCOUNTER — Telehealth: Payer: Self-pay | Admitting: Internal Medicine

## 2015-04-02 NOTE — Telephone Encounter (Signed)
Patient reports he is having some difficulties and need to be out of work for a while to get himself better.   He would like an office visit to see Dr. Debara Pickett - patient scheduled May 23 @ 215pm  Advised to bring FMLA paperwork. Explained that paperwork goes thru processing center and there is a $25 fee associated with completing the paperwork. He was instructed to inform check in that he had paperwork so that medical records could be notified.

## 2015-04-02 NOTE — Telephone Encounter (Signed)
Pt called in wanting to speak with Dr. Stanford Breed about some FMLA papers he wants him to fill out. Please call back  Thanks

## 2015-04-14 ENCOUNTER — Encounter: Payer: Self-pay | Admitting: Internal Medicine

## 2015-04-14 ENCOUNTER — Ambulatory Visit (INDEPENDENT_AMBULATORY_CARE_PROVIDER_SITE_OTHER): Payer: BC Managed Care – PPO | Admitting: Internal Medicine

## 2015-04-14 ENCOUNTER — Telehealth: Payer: Self-pay | Admitting: Internal Medicine

## 2015-04-14 VITALS — BP 140/92 | HR 87 | Ht 68.0 in | Wt 259.5 lb

## 2015-04-14 DIAGNOSIS — R0609 Other forms of dyspnea: Secondary | ICD-10-CM

## 2015-04-14 DIAGNOSIS — E669 Obesity, unspecified: Secondary | ICD-10-CM

## 2015-04-14 DIAGNOSIS — R0789 Other chest pain: Secondary | ICD-10-CM

## 2015-04-14 DIAGNOSIS — G609 Hereditary and idiopathic neuropathy, unspecified: Secondary | ICD-10-CM

## 2015-04-14 DIAGNOSIS — I10 Essential (primary) hypertension: Secondary | ICD-10-CM

## 2015-04-14 DIAGNOSIS — I25118 Atherosclerotic heart disease of native coronary artery with other forms of angina pectoris: Secondary | ICD-10-CM

## 2015-04-14 DIAGNOSIS — R0602 Shortness of breath: Secondary | ICD-10-CM

## 2015-04-14 DIAGNOSIS — Z720 Tobacco use: Secondary | ICD-10-CM

## 2015-04-14 DIAGNOSIS — R079 Chest pain, unspecified: Secondary | ICD-10-CM | POA: Diagnosis not present

## 2015-04-14 DIAGNOSIS — F17200 Nicotine dependence, unspecified, uncomplicated: Secondary | ICD-10-CM

## 2015-04-14 NOTE — Telephone Encounter (Signed)
5.23.16 Patient brought FMLA forms ([employer] [facility]) for Dr Debara Pickett to complete and sign.  Received signed release from patient.  He to bring back check or money order for [employer] to process.

## 2015-04-14 NOTE — Patient Instructions (Signed)
Your physician has requested that you have en exercise stress myoview. For further information please visit [email or web address]. Please follow instruction sheet, as given.  Your physician has recommended that you have a pulmonary function test @ [facility]. Pulmonary Function Tests are a group of tests that measure how well air moves in and out of your lungs.  Your physician recommends that you schedule a follow-up appointment after your tests.

## 2015-04-15 DIAGNOSIS — R0609 Other forms of dyspnea: Secondary | ICD-10-CM

## 2015-04-15 NOTE — Progress Notes (Signed)
OFFICE NOTE  Chief Complaint:  Shortness of breath, chest pain  Primary Care Physician: Vonna Drafts., FNP  HPI:  Andrew West  is a 49 year old gentleman with a history of coronary artery disease and stent placement to the circumflex and obtuse marginal and a history of in stent restenosis to the LAD. There is also a nondominant right coronary artery that is 100% occluded, filled with collaterals. He did have a stress test recently, which showed an EF of 53% and reversible septal ischemia in the setting of chest pain and after much convincing underwent cardiac catheterization.  He then underwent cutting balloon angioplasty to an ostial lateral OM1 branch and the bifurcation AV groove circumflex OM junction. This was successful at reducing the stenosis to 0%. This was in November 2012 and he did not return for followup appointment until April 2013. He was suffering from low back pain and has been evaluated and treated by Dr. Trenton Gammon with a laminectomy/discectomy, which he says was not helpful. Otherwise, he continues to smoke and occasionally complains of shortness of breath and some chest pain symptoms which are atypical. He is on long-acting and/or has not needed to take short-acting nitroglycerin. His other concern today is that he is having problems with his teeth and was recommended to have edentulation and by an oral surgeon Dr. Diona Browner. Finally, he is suffering from significant stress and depression due to recent separation with wife in dealing with his kids. This seems to be a big tablets on his continued smoking.  Andrew West returns today in the office. He feels fairly well. He denies any chest pain. He continues to have problems with low back pain and numbness and tingling in his legs. He is apparently status post laminectomy and microdiscectomy by Dr. Trenton Gammon and continues to have symptoms which may be related to that. He's also complaining of what sounds like  neuropathic pain. He is not currently on medication. He is asking for Tylenol 3 for pain today which I told him I did not prescribe. Ultimately there are no further surgical or nonsurgical options, he may need to go on a neuropathic pain medication or perhaps be referred to a pain management specialist. He is also looking for a new primary care provider as his primary care provider is close to retirement. He reports he has cut back his smoking to about 1 pack every 2 weeks. He is also been out of amlodipine and simvastatin although his blood pressure is well controlled.  I saw Andrew West in the office today. He is reporting now complains of shortness of breath and chest pain. He also feels like his breathing is worse when working around chemicals at work. He uses a respiratory protection mask but also feels like he is under significant stress. He's had missed several days of work and is concerned about his job. Sounds like he is in a difficult work environment. He says that he is apologizing for "deceiving me" that he was not honest about his symptoms. He apparently has been having shortness of breath and chest pain for several months and did not relay that to me.   PMHx:  Past Medical History  Diagnosis Date  . Myocardial infarction     2002; treated with stent to mid L circumflex  . Heart disease   . Hypercholesteremia   . Coronary artery disease   . NSTEMI (non-ST elevated myocardial infarction) 10/18/2011  . CAD (coronary artery disease), native coronary artery 04/18/2008  . Diabetes  mellitus     Takes Metformin  . Hypertension     Takes Lisinopril   . GERD (gastroesophageal reflux disease)     Takes Dexilant  . Angina     Takes Isosorbide & Nitroglycerin  . Headache(784.0)   . Pneumonia     approx 2 years ago  . Bronchitis     hx of  . Shortness of breath     Occurs while laying down at times  . History of nuclear stress test 07/29/2011    decreased activity in septum on stress  images compared with rest; wall motion abnormality, EF 53%  . Rhabdomyolysis     h/o, r/t statins  . H/O cardiac catheterization     with 5 separate dated interventions  . Chronic leg pain     bilateral  . Chronic back pain     Past Surgical History  Procedure Laterality Date  . Coronary angioplasty with stent placement  10/09/2001    PTCA & stenting of mid AV circumflex; 2.5x68m Pixel stent  . Colonoscopy w/ polypectomy    . Lumbar laminectomy/decompression microdiscectomy  03/31/2012    Procedure: LUMBAR LAMINECTOMY/DECOMPRESSION MICRODISCECTOMY 1 LEVEL;  Surgeon: HCharlie Pitter MD;  [facility];  Service: Neurosurgery;  Laterality: Left;  . Transthoracic echocardiogram  07/28/2011    EF 55-65%; LVH, grade 1 diastolic dysfunction;   . Coronary angioplasty with stent placement  12/13/2001    PCI with stent to mid L circumflex, 95% stenosis to 0% residual  . Coronary angioplasty with stent placement  10/10/2003    PCI to mid AV circumflex; LAD 30% disease; RCA 100% occluded prox.  . Coronary angioplasty with stent placement  09/01/2011    PCI with stenting with bare metal stent to mid AV groove circumflex and PDA  . Coronary angioplasty with stent placement  10/17/2011    cutting balloon angioplasty of ostial lateral OM1 branch and bifurcation AV groove circumflex OM junction; stenosis reduced to 0%  . Left heart catheterization with coronary angiogram N/A 10/18/2011    Procedure: LEFT HEART CATHETERIZATION WITH CORONARY ANGIOGRAM;  Surgeon: DLeonie Man MD;  [facility];  Service: Cardiovascular;  Laterality: N/A;    FAMHx:  Family History  Problem Relation Age of Onset  . Anesthesia problems Neg Hx   . Hypotension Neg Hx   . Malignant hyperthermia Neg Hx   . Pseudochol deficiency Neg Hx   . Hypertension Mother   . Heart attack Father   . Diabetes Mother   . Heart disease Brother     x 3   . Heart attack Brother     deceased  . Hypertension Sister     . Diabetes Sister     SOCHx:   reports that he has been smoking Cigarettes.  He has smoked for the past 25 years. He has never used smokeless tobacco. He reports that he does not drink alcohol or use illicit drugs.  ALLERGIES:  Allergies  Allergen Reactions  . Iohexol     PT. TO BE PREMEDICATED PRIOR TO IV CONTRAST PER DR SKris Hartmann/MMS//12/15/15Desc: PT BECAME SOB AND CHEST TIGHTNESS AFTER CONTRAST INJECTION.  [name],RT-RCT., Onset Date: [id number]  . Vicodin [Hydrocodone-Acetaminophen] Itching and Rash    ROS: A comprehensive review of systems was negative except for: Constitutional: positive for fatigue Cardiovascular: positive for lower extremity edema Musculoskeletal: positive for arthralgias, back pain and neuropathy  HOME MEDS: Current Outpatient Prescriptions  Medication Sig Dispense Refill  .  acetaminophen (TYLENOL) 500 MG tablet Take 1,000 mg by mouth every 6 (six) hours as needed for headache.    Marland Kitchen aspirin 325 MG tablet Take 325 mg by mouth daily.    Marland Kitchen azithromycin (ZITHROMAX) 250 MG tablet Take 1 tablet (250 mg total) by mouth daily. Take first 2 tablets together, then 1 every day until finished. 6 tablet 0  . clopidogrel (PLAVIX) 75 MG tablet Take 1 tablet (75 mg total) by mouth daily. 90 tablet 2  . cyclobenzaprine (FLEXERIL) 10 MG tablet Take 1 tablet (10 mg total) by mouth 3 (three) times daily as needed for muscle spasms. 15 tablet 0  . dexlansoprazole (DEXILANT) 60 MG capsule Take 60 mg by mouth daily.    . isosorbide mononitrate (IMDUR) 30 MG 24 hr tablet Take 1 tablet (30 mg total) by mouth 2 (two) times daily. 180 tablet 2  . lisinopril (PRINIVIL,ZESTRIL) 20 MG tablet Take 1 tablet (20 mg total) by mouth daily. 90 tablet 2  . metFORMIN (GLUCOPHAGE) 1000 MG tablet Take 1,000 mg by mouth 2 (two) times daily with a meal.     . metoprolol (LOPRESSOR) 50 MG tablet Take 1 tablet (50 mg total) by mouth 2 (two) times daily. 180 tablet 3  . naproxen (NAPROSYN) 500  MG tablet Take 0.5 tablets (250 mg total) by mouth 2 (two) times daily as needed for mild pain or moderate pain (TAKE WITH MEALS.). 10 tablet 0  . nitroGLYCERIN (NITROSTAT) 0.4 MG SL tablet Place 1 tablet (0.4 mg total) under the tongue every 5 (five) minutes as needed for chest pain. 25 tablet 3  . penicillin v potassium (VEETID) 500 MG tablet Take 1 tablet (500 mg total) by mouth 3 (three) times daily. 30 tablet 0  . simvastatin (ZOCOR) 20 MG tablet Take 1 tablet (20 mg total) by mouth at bedtime. 90 tablet 2  . traMADol (ULTRAM) 50 MG tablet Take 1 tablet (50 mg total) by mouth every 6 (six) hours as needed. 15 tablet 0  . valsartan-hydrochlorothiazide (DIOVAN-HCT) 320-12.5 MG per tablet Take 1 tablet by mouth daily. 90 tablet 2   No current facility-administered medications for this visit.    LABS/IMAGING: No results found for this or any previous visit (from the past 48 hour(s)). No results found.  VITALS: BP 140/92 mmHg  Pulse 87  Ht '5\' 8"'$  (1.727 m)  Wt 259 lb 8 oz (117.708 kg)  BMI 39.47 kg/m2  EXAM: General appearance: alert and no distress Neck: no adenopathy, no carotid bruit, no JVD, supple, symmetrical, trachea midline and thyroid not enlarged, symmetric, no tenderness/mass/nodules Lungs: clear to auscultation bilaterally Heart: regular rate and rhythm, S1, S2 normal, no murmur, click, rub or gallop Abdomen: soft, non-tender; bowel sounds normal; no masses,  no organomegaly Extremities: extremities normal, atraumatic, no cyanosis or edema Pulses: 2+ and symmetric Skin: Skin color, texture, turgor normal. No rashes or lesions Neurologic: Grossly normal  EKG: Normal sinus rhythm at 87  ASSESSMENT:  1. Chest pain and dyspnea on exertion 2. Coronary artery disease status post PCI and cutting balloon angioplasty over 2 years ago 3. Ongoing tobacco abuse 4. Hypertension 5. Dyslipidemia 6. Neuropathy 7. Persistent low back pain 8. Diabetes type 2 9. Morbid  obesity  PLAN: 1.   Mr. Laymon has multiple cardiac risk factors and still has continued risks for coronary artery disease. He says now that he's been having chest pain and shortness of breath on exertion which has become progressively worse over the past several months,  but did not want to tell me for unknown reasons. He feels that some of the chemicals at his job site or worsening shortness of breath. I would like to obtain a stress test to rule out new ischemia and also pulmonary function testing. Plan to see him back to discuss results of the studies.  Pixie Casino, MD, [facility] Attending Cardiologist [facility] 04/15/2015, 5:39 PM

## 2015-04-17 NOTE — Telephone Encounter (Signed)
5.26.16 Patient brought pmt for [employer] to complete FMLA forms.  Sent to FMLA forms to [employer] @ [facility] by courier.  (signed ROI, pmt and FMLA forms) lp

## 2015-04-18 NOTE — Telephone Encounter (Signed)
04/18/2015 Received FMLA form back from [employer] for the Dr. Debara Pickett to sign, I then gave form to [facility] for Dr. Debara Pickett to complete. cbr

## 2015-04-24 ENCOUNTER — Telehealth (HOSPITAL_COMMUNITY): Payer: Self-pay

## 2015-04-24 NOTE — Telephone Encounter (Signed)
Encounter complete. 

## 2015-04-28 ENCOUNTER — Other Ambulatory Visit: Payer: Self-pay | Admitting: *Deleted

## 2015-04-28 ENCOUNTER — Ambulatory Visit (HOSPITAL_COMMUNITY)
Admission: RE | Admit: 2015-04-28 | Discharge: 2015-04-28 | Disposition: A | Discharge status: Home / Self Care | Payer: BC Managed Care – PPO | Source: Ambulatory Visit | Attending: Internal Medicine | Admitting: Internal Medicine

## 2015-04-28 DIAGNOSIS — R079 Chest pain, unspecified: Secondary | ICD-10-CM | POA: Diagnosis not present

## 2015-04-28 DIAGNOSIS — R0602 Shortness of breath: Secondary | ICD-10-CM | POA: Diagnosis not present

## 2015-04-28 DIAGNOSIS — R942 Abnormal results of pulmonary function studies: Secondary | ICD-10-CM

## 2015-04-28 DIAGNOSIS — F17200 Nicotine dependence, unspecified, uncomplicated: Secondary | ICD-10-CM | POA: Diagnosis not present

## 2015-04-28 LAB — PULMONARY FUNCTION TEST
DL/VA % pred: 125 %
DL/VA: 5.76 ml/min/mmHg/L
DLCO UNC % PRED: 95 %
DLCO unc: 29.54 ml/min/mmHg
FEF 25-75 POST: 2.75 L/s
FEF 25-75 Pre: 0.94 L/sec
FEF2575-%Change-Post: 192 %
FEF2575-%PRED-POST: 83 %
FEF2575-%PRED-PRE: 28 %
FEV1-%Change-Post: 30 %
FEV1-%Pred-Post: 65 %
FEV1-%Pred-Pre: 50 %
FEV1-Post: 2.15 L
FEV1-Pre: 1.64 L
FEV1FVC-%Change-Post: 4 %
FEV1FVC-%Pred-Pre: 86 %
FEV6-%Change-Post: 24 %
FEV6-%PRED-POST: 73 %
FEV6-%Pred-Pre: 59 %
FEV6-POST: 2.92 L
FEV6-PRE: 2.34 L
FEV6FVC-%Change-Post: 0 %
FEV6FVC-%Pred-Post: 100 %
FEV6FVC-%Pred-Pre: 101 %
FVC-%CHANGE-POST: 25 %
FVC-%Pred-Post: 73 %
FVC-%Pred-Pre: 58 %
FVC-Post: 2.99 L
FVC-Pre: 2.39 L
PRE FEV1/FVC RATIO: 69 %
PRE FEV6/FVC RATIO: 98 %
Post FEV1/FVC ratio: 72 %
Post FEV6/FVC ratio: 97 %
RV % pred: 113 %
RV: 2.21 L
TLC % PRED: 84 %
TLC: 5.74 L

## 2015-04-28 MED ORDER — ALBUTEROL SULFATE (2.5 MG/3ML) 0.083% IN NEBU
2.5000 mg | INHALATION_SOLUTION | Freq: Once | RESPIRATORY_TRACT | Status: AC
  Administered 2015-04-28: 2.5 mg via RESPIRATORY_TRACT

## 2015-04-29 ENCOUNTER — Encounter (HOSPITAL_COMMUNITY): Payer: Self-pay

## 2015-04-30 ENCOUNTER — Telehealth: Payer: Self-pay | Admitting: Internal Medicine

## 2015-04-30 NOTE — Telephone Encounter (Addendum)
04/30/2015 Received signed FMLA form back from [facility] Dr. Debara Pickett nurse.  Patient called to see if the forms were ready I informed him that they were ready he said that he would come by office to pick them up so I informed him that was fine that I will have them ready.

## 2015-05-01 ENCOUNTER — Telehealth (HOSPITAL_COMMUNITY): Payer: Self-pay

## 2015-05-01 NOTE — Telephone Encounter (Signed)
Encounter complete. 

## 2015-05-06 ENCOUNTER — Ambulatory Visit (HOSPITAL_COMMUNITY)
Admission: RE | Admit: 2015-05-06 | Discharge: 2015-05-06 | Disposition: A | Discharge status: Home / Self Care | Payer: BC Managed Care – PPO | Source: Ambulatory Visit | Attending: Internal Medicine | Admitting: Internal Medicine

## 2015-05-06 DIAGNOSIS — R0602 Shortness of breath: Secondary | ICD-10-CM | POA: Insufficient documentation

## 2015-05-06 DIAGNOSIS — R079 Chest pain, unspecified: Secondary | ICD-10-CM | POA: Diagnosis not present

## 2015-05-06 LAB — MYOCARDIAL PERFUSION IMAGING
CHL CUP NUCLEAR SSS: 9
CHL CUP RESTING HR STRESS: 73 {beats}/min
LV sys vol: 68 mL
LVDIAVOL: 157 mL
Nuc Stress EF: 56 %
Peak HR: 112 {beats}/min
SDS: 3
SRS: 6
TID: 1.26

## 2015-05-06 MED ORDER — AMINOPHYLLINE 25 MG/ML IV SOLN
75.0000 mg | Freq: Once | INTRAVENOUS | Status: AC
  Administered 2015-05-06: 75 mg via INTRAVENOUS

## 2015-05-06 MED ORDER — TECHNETIUM TC 99M SESTAMIBI GENERIC - CARDIOLITE
10.1000 | Freq: Once | INTRAVENOUS | Status: AC | PRN
  Administered 2015-05-06: 10.1 via INTRAVENOUS

## 2015-05-06 MED ORDER — REGADENOSON 0.4 MG/5ML IV SOLN
0.4000 mg | Freq: Once | INTRAVENOUS | Status: AC
  Administered 2015-05-06: 0.4 mg via INTRAVENOUS

## 2015-05-06 MED ORDER — TECHNETIUM TC 99M SESTAMIBI GENERIC - CARDIOLITE
31.5000 | Freq: Once | INTRAVENOUS | Status: AC | PRN
  Administered 2015-05-06: 32 via INTRAVENOUS

## 2015-05-07 ENCOUNTER — Telehealth: Payer: Self-pay | Admitting: Internal Medicine

## 2015-05-07 NOTE — Telephone Encounter (Signed)
Pt called in stating that he had a stress test done yesterday in which he had to hold his medication. He would like to know if he can start back taking them. Please call  Thanks

## 2015-05-07 NOTE — Telephone Encounter (Signed)
Spoke with pt, aware to restart all medications as previously taking.

## 2015-05-07 NOTE — Telephone Encounter (Signed)
Did not need to start a note

## 2015-05-21 ENCOUNTER — Institutional Professional Consult (permissible substitution): Payer: Self-pay | Admitting: Pulmonary Disease

## 2015-06-04 ENCOUNTER — Encounter: Payer: Self-pay | Admitting: Internal Medicine

## 2015-06-04 DIAGNOSIS — J45909 Unspecified asthma, uncomplicated: Secondary | ICD-10-CM | POA: Insufficient documentation

## 2015-06-11 ENCOUNTER — Telehealth: Payer: Self-pay | Admitting: Internal Medicine

## 2015-06-11 NOTE — Telephone Encounter (Signed)
Pt wants Jenna to call him tomorrow,he did not give any details.

## 2015-06-12 NOTE — Telephone Encounter (Signed)
Pt instructed to f/u w/ PCP re: pain meds.

## 2015-06-12 NOTE — Telephone Encounter (Signed)
Defer pain meds to PCP.  Thanks.  Dr. Lemmie Evens

## 2015-06-12 NOTE — Telephone Encounter (Signed)
Called yesterday afternoon, caller did not answer.  Called back this morning. Asked patient if he needed to speak to [facility] or if something he could discuss w/ any nurse.  Pt outlined concern that his shortness of breath and chest pain he's been having have continued since reported to Dr. Debara Pickett in May. This is unchanged. He denies new factors or symptoms.  Pt denies new quality or severity to pain, SOB. Chest pain is mainly right sided and is more or less ongoing all the time - describes as soreness. He does report unproductive cough.  He has not gone to ED for this since seeing [place]. He has had recent PFT and stress test by Dr. Debara Pickett. Acknowledged understanding of these results. Acknowledges pending appt w/ pulmonary specialist on august 2nd.  In meantime, he is requesting if Dr. Debara Pickett able to prescribe something to alleviate pain. He is taking tylenol w/ minimal relief. He does not currently take any inhalant medications. Informed him I am not sure about prescription pain meds - would defer to his cardiologist. Advised to continue current modes of relief. Advised ED for worsening symptoms.

## 2015-06-24 ENCOUNTER — Ambulatory Visit (INDEPENDENT_AMBULATORY_CARE_PROVIDER_SITE_OTHER): Payer: BC Managed Care – PPO | Admitting: Pulmonary Disease

## 2015-06-24 ENCOUNTER — Other Ambulatory Visit (INDEPENDENT_AMBULATORY_CARE_PROVIDER_SITE_OTHER): Payer: BC Managed Care – PPO

## 2015-06-24 ENCOUNTER — Encounter: Payer: Self-pay | Admitting: Pulmonary Disease

## 2015-06-24 VITALS — BP 138/84 | HR 78 | Ht 68.0 in | Wt 268.0 lb

## 2015-06-24 DIAGNOSIS — R9389 Abnormal findings on diagnostic imaging of other specified body structures: Secondary | ICD-10-CM

## 2015-06-24 DIAGNOSIS — R05 Cough: Secondary | ICD-10-CM | POA: Diagnosis not present

## 2015-06-24 DIAGNOSIS — J449 Chronic obstructive pulmonary disease, unspecified: Secondary | ICD-10-CM | POA: Insufficient documentation

## 2015-06-24 DIAGNOSIS — R938 Abnormal findings on diagnostic imaging of other specified body structures: Secondary | ICD-10-CM

## 2015-06-24 DIAGNOSIS — J4540 Moderate persistent asthma, uncomplicated: Secondary | ICD-10-CM

## 2015-06-24 DIAGNOSIS — R059 Cough, unspecified: Secondary | ICD-10-CM

## 2015-06-24 DIAGNOSIS — R911 Solitary pulmonary nodule: Secondary | ICD-10-CM | POA: Insufficient documentation

## 2015-06-24 LAB — CBC WITH DIFFERENTIAL/PLATELET
BASOS ABS: 0 10*3/uL (ref 0.0–0.1)
Basophils Relative: 0.6 % (ref 0.0–3.0)
EOS ABS: 0.2 10*3/uL (ref 0.0–0.7)
Eosinophils Relative: 3 % (ref 0.0–5.0)
HCT: 43.1 % (ref 39.0–52.0)
Hemoglobin: 14.1 g/dL (ref 13.0–17.0)
Lymphocytes Relative: 39.4 % (ref 12.0–46.0)
Lymphs Abs: 3.1 10*3/uL (ref 0.7–4.0)
MCHC: 32.7 g/dL (ref 30.0–36.0)
MCV: 80.5 fl (ref 78.0–100.0)
MONOS PCT: 13.2 % — AB (ref 3.0–12.0)
Monocytes Absolute: 1 10*3/uL (ref 0.1–1.0)
Neutro Abs: 3.5 10*3/uL (ref 1.4–7.7)
Neutrophils Relative %: 43.8 % (ref 43.0–77.0)
Platelets: 222 10*3/uL (ref 150.0–400.0)
RBC: 5.36 Mil/uL (ref 4.22–5.81)
RDW: 14 % (ref 11.5–15.5)
WBC: 7.9 10*3/uL (ref 4.0–10.5)

## 2015-06-24 MED ORDER — ALBUTEROL SULFATE 108 (90 BASE) MCG/ACT IN AEPB: 1.0000 | INHALATION_SPRAY | Freq: Four times a day (QID) | RESPIRATORY_TRACT | Status: DC | PRN

## 2015-06-24 NOTE — Assessment & Plan Note (Signed)
His cough has been persistent for some time. This is in the setting of an abnormal chest x-ray which is yet to be further evaluated. The x-ray has been read as an infiltrate and/or pneumonia. However, the pneumonia was present in 2015 so I'm concerned about this persistent finding. The differential diagnosis includes an infiltrative process like sarcoid versus an atypical infection versus just simple scarring.  His cough could be due to acid reflux and asthma as well. We will treat both of those today while working up the cause of the abnormal chest x-ray. Finally, he is on lisinopril. Because of all the other pulmonary issues which are present I see no reason to stop lisinopril at this time, further he has significant cardiac comorbid illnesses so it's worthwhile to keep going for now. However, if his pulmonary workup proves to be benign or his symptoms don't resolve with adequate treatment of asthma then we may consider stopping the lisinopril.  Plan: CT chest Start treatment for asthma as detailed above Start treatment for acid reflux lifestyle modification a daily PPI

## 2015-06-24 NOTE — Assessment & Plan Note (Signed)
As detailed above 

## 2015-06-24 NOTE — Assessment & Plan Note (Signed)
He has asthma based on his airflow obstruction with positive bronchodilation response. It's not unusual for this to be diagnosed in adulthood asthma and for adults with asthma will be diagnosed after age 49. However, I do worry about the possibility of another underlying process like sarcoid considering the abnormal chest x-ray. This can often also produce an asthma-like picture.  Plan: Check serum IgE level Check serum eosinophils Start inhaled cortico-steroid, he was given a sample of Asmanex Start albuterol as needed

## 2015-06-24 NOTE — Patient Instructions (Signed)
We will call you with the results of the CT scan and the bloodwork Take the Asmanex 2 puffs twice a day no matter how you feel Use the albuterol 1 puff every 6 hours as needed for shortness of breath Take over-the-counter Prilosec or its generic form (omeprazole) every day to matter what  Follow the acid reflux lifestyle modification we recommended We will see you back in 3-4 weeks or sooner if needed

## 2015-06-24 NOTE — Progress Notes (Signed)
Subjective:    Patient ID: Andrew West, male    DOB: 23-Dec-1965, 49 y.o.   MRN: [id number]  HPI Chief Complaint  Patient presents with  . Advice Only    referred by Dr. Rennis Golden for ABN PFT.  pt c/o sob, coughing Xseveral months.     Andrew West has been having dyspnea for "a long time".  He said that he has had problems with his breahting for at least a year. He never had asthma or lung problems as a child.  He used to smoke 1-2 cigarettes a day since his teenage years.  He quit smoking in 2016. He has no family members with lung problems with the exception of an uncle with lung cancer.    He says that the dyspne has ben progressing over the last year.  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  He used floor cleaners, floor stripping chemicals, bleach, acid.  He remembers one day when he was cleaning a bathroom in a new building and he used a chemical that he said was pretty strong.  He remembers being short of breath and nauseated.    He cannot think of any environmental triggers for his dyspnea other than heat.  He avoids dust.  He has orthopnea, breathes better when sitting up.  He also notes some chest pain on the right side.  He denies sinus symptoms.  He has severe acid reflux symptoms.  Any meat will give him more symptoms of heart burn.  He has tried an OTC acid reducer but this didn't help.  He has a dry cough most of the time, rare mucus production.  This is associate with right sided chest soreness with coughing.  Past Medical History  Diagnosis Date  . Myocardial infarction     2002; treated with stent to mid L circumflex  . Heart disease   . Hypercholesteremia   . Coronary artery disease   . NSTEMI (non-ST elevated myocardial infarction) 10/18/2011  . CAD (coronary artery disease), native coronary artery 04/18/2008  . Diabetes mellitus     Takes Metformin  . Hypertension     Takes Lisinopril   . GERD  (gastroesophageal reflux disease)     Takes Dexilant  . Angina     Takes Isosorbide & Nitroglycerin  . Headache(784.0)   . Pneumonia     approx 2 years ago  . Bronchitis     hx of  . Shortness of breath     Occurs while laying down at times  . History of nuclear stress test 07/29/2011    decreased activity in septum on stress images compared with rest; wall motion abnormality, EF 53%  . Rhabdomyolysis     h/o, r/t statins  . H/O cardiac catheterization     with 5 separate dated interventions  . Chronic leg pain     bilateral  . Chronic back pain      Family History  Problem Relation Age of Onset  . Anesthesia problems Neg Hx   . Hypotension Neg Hx   . Malignant hyperthermia Neg Hx   . Pseudochol deficiency Neg Hx   . Hypertension Mother   . Heart attack Father   . Diabetes Mother   . Heart disease Brother     x 3   . Heart attack Brother     deceased  . Hypertension Sister   . Diabetes Sister  History   Social History  . Marital Status: Single    Spouse Name: N/A  . Number of Children: N/A  . Years of Education: N/A   Occupational History  . Not on file.   Social History Main Topics  . Smoking status: Former Smoker -- 0.25 packs/day for 25 years    Types: Cigarettes    Quit date: 05/24/2015  . Smokeless tobacco: Never Used  . Alcohol Use: No  . Drug Use: No  . Sexual Activity: Yes   Other Topics Concern  . Not on file   Social History Narrative     Allergies  Allergen Reactions  . Iohexol     PT. TO BE PREMEDICATED PRIOR TO IV CONTRAST PER DR Eppie Gibson /MMS//12/15/15Desc: PT BECAME SOB AND CHEST TIGHTNESS AFTER CONTRAST INJECTION.  [name],RT-RCT., Onset Date: [id number]   . Vicodin [Hydrocodone-Acetaminophen] Itching and Rash     Outpatient Prescriptions Prior to Visit  Medication Sig Dispense Refill  . acetaminophen (TYLENOL) 500 MG tablet Take 1,000 mg by mouth every 6 (six) hours as needed for headache.    Marland Kitchen aspirin 325 MG tablet  Take 325 mg by mouth daily.    . clopidogrel (PLAVIX) 75 MG tablet Take 1 tablet (75 mg total) by mouth daily. 90 tablet 2  . cyclobenzaprine (FLEXERIL) 10 MG tablet Take 1 tablet (10 mg total) by mouth 3 (three) times daily as needed for muscle spasms. 15 tablet 0  . isosorbide mononitrate (IMDUR) 30 MG 24 hr tablet Take 1 tablet (30 mg total) by mouth 2 (two) times daily. 180 tablet 2  . lisinopril (PRINIVIL,ZESTRIL) 20 MG tablet Take 1 tablet (20 mg total) by mouth daily. 90 tablet 2  . metoprolol (LOPRESSOR) 50 MG tablet Take 1 tablet (50 mg total) by mouth 2 (two) times daily. 180 tablet 3  . naproxen (NAPROSYN) 500 MG tablet Take 0.5 tablets (250 mg total) by mouth 2 (two) times daily as needed for mild pain or moderate pain (TAKE WITH MEALS.). 10 tablet 0  . nitroGLYCERIN (NITROSTAT) 0.4 MG SL tablet Place 1 tablet (0.4 mg total) under the tongue every 5 (five) minutes as needed for chest pain. 25 tablet 3  . simvastatin (ZOCOR) 20 MG tablet Take 1 tablet (20 mg total) by mouth at bedtime. 90 tablet 2  . valsartan-hydrochlorothiazide (DIOVAN-HCT) 320-12.5 MG per tablet Take 1 tablet by mouth daily. 90 tablet 2  . metFORMIN (GLUCOPHAGE) 1000 MG tablet Take 1,000 mg by mouth 2 (two) times daily with a meal.     . azithromycin (ZITHROMAX) 250 MG tablet Take 1 tablet (250 mg total) by mouth daily. Take first 2 tablets together, then 1 every day until finished. (Patient not taking: Reported on 06/24/2015) 6 tablet 0  . dexlansoprazole (DEXILANT) 60 MG capsule Take 60 mg by mouth daily.    . penicillin v potassium (VEETID) 500 MG tablet Take 1 tablet (500 mg total) by mouth 3 (three) times daily. 30 tablet 0  . traMADol (ULTRAM) 50 MG tablet Take 1 tablet (50 mg total) by mouth every 6 (six) hours as needed. (Patient not taking: Reported on 06/24/2015) 15 tablet 0   No facility-administered medications prior to visit.       Review of Systems  Constitutional: Negative for fever and unexpected  weight change.  HENT: Negative for congestion, dental problem, ear pain, nosebleeds, postnasal drip, rhinorrhea, sinus pressure, sneezing, sore throat and trouble swallowing.   Eyes: Negative for redness and itching.  Respiratory: Positive  for cough, chest tightness and shortness of breath. Negative for wheezing.   Cardiovascular: Negative for palpitations and leg swelling.  Gastrointestinal: Negative for nausea and vomiting.  Genitourinary: Negative for dysuria.  Musculoskeletal: Negative for joint swelling.  Skin: Negative for rash.  Neurological: Negative for headaches.  Hematological: Does not bruise/bleed easily.  Psychiatric/Behavioral: Negative for dysphoric mood. The patient is not nervous/anxious.        Objective:   Physical Exam  Filed Vitals:   06/24/15 1519  BP: 138/84  Pulse: 78  Height: 5\' 8"  (1.727 m)  Weight: 268 lb (121.564 kg)  SpO2: 98%   RA  Gen: well appearing, no acute distress HENT: NCAT, OP clear, neck supple without masses Eyes: PERRL, EOMi Lymph: no cervical lymphadenopathy PULM: Crackles bases bilaterally CV: RRR, no mgr, no JVD GI: BS+, soft, nontender, no hsm Derm: no rash or skin breakdown MSK: normal bulk and tone Neuro: A&Ox4, CN II-XII intact, strength 5/5 in all 4 extremities Psyche: normal mood and affect   June 2016 pulmonary function testing ratio 69%, FEV1 1.64 L (50% predicted, changed to 2.15 L (65% predicted, 30% change with bronchodilation) total lung capacity 5.74 L (84% predicted), DLCO 29.54 (95% predicted). May 2016 chest x-ray images personally reviewed showing slightly low lung volumes, infiltrate in the left lower lobe.    Assessment & Plan:  Asthma, chronic He has asthma based on his airflow obstruction with positive bronchodilation response. It's not unusual for this to be diagnosed in adulthood asthma and for adults with asthma will be diagnosed after age 45. However, I do worry about the possibility of another  underlying process like sarcoid considering the abnormal chest x-ray. This can often also produce an asthma-like picture.  Plan: Check serum IgE level Check serum eosinophils Start inhaled cortico-steroid, he was given a sample of Asmanex Start albuterol as needed  Cough His cough has been persistent for some time. This is in the setting of an abnormal chest x-ray which is yet to be further evaluated. The x-ray has been read as an infiltrate and/or pneumonia. However, the pneumonia was present in 2015 so I'm concerned about this persistent finding. The differential diagnosis includes an infiltrative process like sarcoid versus an atypical infection versus just simple scarring.  His cough could be due to acid reflux and asthma as well. We will treat both of those today while working up the cause of the abnormal chest x-ray. Finally, he is on lisinopril. Because of all the other pulmonary issues which are present I see no reason to stop lisinopril at this time, further he has significant cardiac comorbid illnesses so it's worthwhile to keep going for now. However, if his pulmonary workup proves to be benign or his symptoms don't resolve with adequate treatment of asthma then we may consider stopping the lisinopril.  Plan: CT chest Start treatment for asthma as detailed above Start treatment for acid reflux lifestyle modification a daily PPI  Abnormal CXR As detailed above     Current outpatient prescriptions:  .  acetaminophen (TYLENOL) 500 MG tablet, Take 1,000 mg by mouth every 6 (six) hours as needed for headache., Disp: , Rfl:  .  aspirin 325 MG tablet, Take 325 mg by mouth daily., Disp: , Rfl:  .  clopidogrel (PLAVIX) 75 MG tablet, Take 1 tablet (75 mg total) by mouth daily., Disp: 90 tablet, Rfl: 2 .  cyclobenzaprine (FLEXERIL) 10 MG tablet, Take 1 tablet (10 mg total) by mouth 3 (three) times daily as needed  for muscle spasms., Disp: 15 tablet, Rfl: 0 .  isosorbide mononitrate  (IMDUR) 30 MG 24 hr tablet, Take 1 tablet (30 mg total) by mouth 2 (two) times daily., Disp: 180 tablet, Rfl: 2 .  lisinopril (PRINIVIL,ZESTRIL) 20 MG tablet, Take 1 tablet (20 mg total) by mouth daily., Disp: 90 tablet, Rfl: 2 .  metoprolol (LOPRESSOR) 50 MG tablet, Take 1 tablet (50 mg total) by mouth 2 (two) times daily., Disp: 180 tablet, Rfl: 3 .  naproxen (NAPROSYN) 500 MG tablet, Take 0.5 tablets (250 mg total) by mouth 2 (two) times daily as needed for mild pain or moderate pain (TAKE WITH MEALS.)., Disp: 10 tablet, Rfl: 0 .  nitroGLYCERIN (NITROSTAT) 0.4 MG SL tablet, Place 1 tablet (0.4 mg total) under the tongue every 5 (five) minutes as needed for chest pain., Disp: 25 tablet, Rfl: 3 .  simvastatin (ZOCOR) 20 MG tablet, Take 1 tablet (20 mg total) by mouth at bedtime., Disp: 90 tablet, Rfl: 2 .  valsartan-hydrochlorothiazide (DIOVAN-HCT) 320-12.5 MG per tablet, Take 1 tablet by mouth daily., Disp: 90 tablet, Rfl: 2 .  Albuterol Sulfate (PROAIR RESPICLICK) 108 (90 BASE) MCG/ACT AEPB, Inhale 1 puff into the lungs every 6 (six) hours as needed (shortness of breath)., Disp: 1 each, Rfl: 5 .  metFORMIN (GLUCOPHAGE) 1000 MG tablet, Take 1,000 mg by mouth 2 (two) times daily with a meal. , Disp: , Rfl:

## 2015-06-25 LAB — IGE: IgE (Immunoglobulin E), Serum: 8 kU/L (ref <115)

## 2015-06-25 LAB — ANGIOTENSIN CONVERTING ENZYME: Angiotensin-Converting Enzyme: 2 U/L — ABNORMAL LOW (ref 8–52)

## 2015-07-01 ENCOUNTER — Other Ambulatory Visit: Payer: Self-pay | Admitting: Internal Medicine

## 2015-07-01 MED ORDER — ISOSORBIDE MONONITRATE ER 30 MG PO TB24: 30.0000 mg | ORAL_TABLET | Freq: Two times a day (BID) | ORAL | Status: DC

## 2015-07-01 MED ORDER — CLOPIDOGREL BISULFATE 75 MG PO TABS: 75.0000 mg | ORAL_TABLET | Freq: Every day | ORAL | Status: DC

## 2015-07-01 MED ORDER — METOPROLOL TARTRATE 50 MG PO TABS: 50.0000 mg | ORAL_TABLET | Freq: Two times a day (BID) | ORAL | Status: DC

## 2015-07-01 MED ORDER — VALSARTAN-HYDROCHLOROTHIAZIDE 320-12.5 MG PO TABS: 1.0000 | ORAL_TABLET | Freq: Every day | ORAL | Status: DC

## 2015-07-01 MED ORDER — SIMVASTATIN 20 MG PO TABS: 20.0000 mg | ORAL_TABLET | Freq: Every day | ORAL | Status: DC

## 2015-07-01 MED ORDER — LISINOPRIL 20 MG PO TABS: 20.0000 mg | ORAL_TABLET | Freq: Every day | ORAL | Status: DC

## 2015-07-01 NOTE — Telephone Encounter (Signed)
Pt need refills on all of his medicine. Please call them all to [phone]

## 2015-07-01 NOTE — Telephone Encounter (Signed)
Spoke with pt, refills sent to the pharmacy. 

## 2015-07-02 ENCOUNTER — Telehealth: Payer: Self-pay | Admitting: Pulmonary Disease

## 2015-07-02 NOTE — Telephone Encounter (Signed)
Left message for Marzetta Board in [facility] Dept to call back.

## 2015-07-02 NOTE — Telephone Encounter (Signed)
Marzetta Board said disregard message.

## 2015-07-03 ENCOUNTER — Ambulatory Visit (INDEPENDENT_AMBULATORY_CARE_PROVIDER_SITE_OTHER)
Admission: RE | Admit: 2015-07-03 | Discharge: 2015-07-03 | Disposition: A | Discharge status: Home / Self Care | Payer: BC Managed Care – PPO | Source: Ambulatory Visit | Attending: Pulmonary Disease | Admitting: Pulmonary Disease

## 2015-07-03 ENCOUNTER — Telehealth: Payer: Self-pay | Admitting: Pulmonary Disease

## 2015-07-03 DIAGNOSIS — R938 Abnormal findings on diagnostic imaging of other specified body structures: Secondary | ICD-10-CM

## 2015-07-03 DIAGNOSIS — R9389 Abnormal findings on diagnostic imaging of other specified body structures: Secondary | ICD-10-CM

## 2015-07-03 DIAGNOSIS — J449 Chronic obstructive pulmonary disease, unspecified: Secondary | ICD-10-CM

## 2015-07-03 DIAGNOSIS — J432 Centrilobular emphysema: Secondary | ICD-10-CM

## 2015-07-03 NOTE — Telephone Encounter (Signed)
Made pt aware of recs. He verbalized understanding and needed nothing further

## 2015-07-03 NOTE — Telephone Encounter (Signed)
Spoke with pt, c/o chest pain- right sided constantly throbbing pain X2 weeks.  Denies prod cough, extremity pain.   Pt has been taking advil and inhalers, not helping.   Pt was seen by BQ on 8/2 as a consult,  [facility] uses [facility] on [address].    Sending to doc of day as BQ is out.  CY please advise.  Thanks!  Allergies  Allergen Reactions  . Iohexol     PT. TO BE PREMEDICATED PRIOR TO IV CONTRAST PER DR Kris Hartmann /MMS//12/15/15Desc: PT BECAME SOB AND CHEST TIGHTNESS AFTER CONTRAST INJECTION.  [name],RT-RCT., Onset Date: [id number]   . Vicodin [Hydrocodone-Acetaminophen] Itching and Rash   Current Outpatient Prescriptions on File Prior to Visit  Medication Sig Dispense Refill  . acetaminophen (TYLENOL) 500 MG tablet Take 1,000 mg by mouth every 6 (six) hours as needed for headache.    . Albuterol Sulfate (PROAIR RESPICLICK) 680 (90 BASE) MCG/ACT AEPB Inhale 1 puff into the lungs every 6 (six) hours as needed (shortness of breath). 1 each 5  . aspirin 325 MG tablet Take 325 mg by mouth daily.    . clopidogrel (PLAVIX) 75 MG tablet Take 1 tablet (75 mg total) by mouth daily. 90 tablet 2  . cyclobenzaprine (FLEXERIL) 10 MG tablet Take 1 tablet (10 mg total) by mouth 3 (three) times daily as needed for muscle spasms. 15 tablet 0  . isosorbide mononitrate (IMDUR) 30 MG 24 hr tablet Take 1 tablet (30 mg total) by mouth 2 (two) times daily. 180 tablet 3  . lisinopril (PRINIVIL,ZESTRIL) 20 MG tablet Take 1 tablet (20 mg total) by mouth daily. 90 tablet 3  . metFORMIN (GLUCOPHAGE) 1000 MG tablet Take 1,000 mg by mouth 2 (two) times daily with a meal.     . metoprolol (LOPRESSOR) 50 MG tablet Take 1 tablet (50 mg total) by mouth 2 (two) times daily. 180 tablet 3  . naproxen (NAPROSYN) 500 MG tablet Take 0.5 tablets (250 mg total) by mouth 2 (two) times daily as needed for mild pain or moderate pain (TAKE WITH MEALS.). 10 tablet 0  . nitroGLYCERIN (NITROSTAT) 0.4 MG SL tablet Place 1 tablet  (0.4 mg total) under the tongue every 5 (five) minutes as needed for chest pain. 25 tablet 3  . simvastatin (ZOCOR) 20 MG tablet Take 1 tablet (20 mg total) by mouth at bedtime. 90 tablet 3  . valsartan-hydrochlorothiazide (DIOVAN-HCT) 320-12.5 MG per tablet Take 1 tablet by mouth daily. 90 tablet 3   No current facility-administered medications on file prior to visit.

## 2015-07-03 NOTE — Telephone Encounter (Signed)
CT chest done today does not show explanation for throbbing right sided chest pain x 2 weeks. This doesn't sound like heart pain, but if he is concerned about that he can callhis cardiologist.  Suggest he try Advil and a heating pad for his chest. He can call back when Dr Lake Bells is available as needed.   If chest pain gets worse, go to ER this weekend.

## 2015-07-08 DIAGNOSIS — J432 Centrilobular emphysema: Secondary | ICD-10-CM | POA: Insufficient documentation

## 2015-07-09 ENCOUNTER — Other Ambulatory Visit: Payer: Self-pay | Admitting: Pulmonary Disease

## 2015-07-09 DIAGNOSIS — R911 Solitary pulmonary nodule: Secondary | ICD-10-CM

## 2015-07-09 NOTE — Progress Notes (Signed)
Quick Note:  Pt returned call. Informed her of results per BQ. Order placed. Pt verbalized understanding and denied any further questions or concerns at this time.   ______

## 2015-07-15 ENCOUNTER — Ambulatory Visit: Payer: Self-pay | Admitting: Pulmonary Disease

## 2015-07-21 ENCOUNTER — Telehealth: Payer: Self-pay | Admitting: Internal Medicine

## 2015-07-21 NOTE — Telephone Encounter (Signed)
Patient states he saw the pulmonary doctor about 3 weeks ago--now he is coughing up blood and feeling very weak.  Please call.

## 2015-07-21 NOTE — Telephone Encounter (Signed)
Spoke with patient who reports that every time he coughs he has a "bad hurting in the center of his chest" and he feels like he is coughing up blood. He reports coughing very forcefully. He reports the blood is not much. He reports he has had to sleep under a fan b/c he feels like he cannot get enough air and he has been having to sleep on 2 extra pillows. Advised that he consult with pulmonologist for advise regarding his breathing and that Dr. Debara Pickett will be made aware of his concerns.

## 2015-07-29 NOTE — Telephone Encounter (Signed)
Sounds appropriate. Thanks.  Dr. Lemmie Evens

## 2015-08-13 ENCOUNTER — Ambulatory Visit: Payer: Self-pay | Admitting: Cardiology

## 2015-08-15 ENCOUNTER — Encounter: Payer: Self-pay | Admitting: Internal Medicine

## 2015-08-15 ENCOUNTER — Encounter: Payer: Self-pay | Admitting: *Deleted

## 2015-08-15 ENCOUNTER — Ambulatory Visit (INDEPENDENT_AMBULATORY_CARE_PROVIDER_SITE_OTHER): Payer: BC Managed Care – PPO | Admitting: Internal Medicine

## 2015-08-15 VITALS — BP 152/90 | HR 82 | Ht 68.0 in | Wt 271.1 lb

## 2015-08-15 DIAGNOSIS — E669 Obesity, unspecified: Secondary | ICD-10-CM | POA: Diagnosis not present

## 2015-08-15 DIAGNOSIS — R0602 Shortness of breath: Secondary | ICD-10-CM

## 2015-08-15 DIAGNOSIS — I2510 Atherosclerotic heart disease of native coronary artery without angina pectoris: Secondary | ICD-10-CM

## 2015-08-15 DIAGNOSIS — J449 Chronic obstructive pulmonary disease, unspecified: Secondary | ICD-10-CM

## 2015-08-15 DIAGNOSIS — R0609 Other forms of dyspnea: Secondary | ICD-10-CM

## 2015-08-15 LAB — BASIC METABOLIC PANEL
BUN: 9 mg/dL (ref 7–25)
CO2: 29 mmol/L (ref 20–31)
CREATININE: 0.86 mg/dL (ref 0.60–1.35)
Calcium: 9.1 mg/dL (ref 8.6–10.3)
Chloride: 103 mmol/L (ref 98–110)
Glucose, Bld: 117 mg/dL — ABNORMAL HIGH (ref 65–99)
POTASSIUM: 4.2 mmol/L (ref 3.5–5.3)
SODIUM: 140 mmol/L (ref 135–146)

## 2015-08-15 NOTE — Patient Instructions (Signed)
Your physician recommends that you schedule a follow-up appointment in: 2 [place] has requested that you have an echocardiogram. Echocardiography is a painless test that uses sound waves to create images of your heart. It provides your doctor with information about the size and shape of your heart and how well your heart's chambers and valves are working. This procedure takes approximately one hour. There are no restrictions for this procedure.   Your physician recommends that you HAVE LAB WORK TODAY

## 2015-08-15 NOTE — Progress Notes (Signed)
OFFICE NOTE  Chief Complaint:  Shortness of breath  Primary Care Physician: Vonna Drafts., FNP  HPI:  Andrew West  is a 49 year old gentleman with a history of coronary artery disease and stent placement to the circumflex and obtuse marginal and a history of in stent restenosis to the LAD. There is also a nondominant right coronary artery that is 100% occluded, filled with collaterals. He did have a stress test recently, which showed an EF of 53% and reversible septal ischemia in the setting of chest pain and after much convincing underwent cardiac catheterization.  He then underwent cutting balloon angioplasty to an ostial lateral OM1 branch and the bifurcation AV groove circumflex OM junction. This was successful at reducing the stenosis to 0%. This was in November 2012 and he did not return for followup appointment until April 2013. He was suffering from low back pain and has been evaluated and treated by Dr. Trenton Gammon with a laminectomy/discectomy, which he says was not helpful. Otherwise, he continues to smoke and occasionally complains of shortness of breath and some chest pain symptoms which are atypical. He is on long-acting and/or has not needed to take short-acting nitroglycerin. His other concern today is that he is having problems with his teeth and was recommended to have edentulation and by an oral surgeon Dr. Diona Browner. Finally, he is suffering from significant stress and depression due to recent separation with wife in dealing with his kids. This seems to be a big tablets on his continued smoking.  Mr. Wendt returns today in the office. He feels fairly well. He denies any chest pain. He continues to have problems with low back pain and numbness and tingling in his legs. He is apparently status post laminectomy and microdiscectomy by Dr. Trenton Gammon and continues to have symptoms which may be related to that. He's also complaining of what sounds like neuropathic pain.  He is not currently on medication. He is asking for Tylenol 3 for pain today which I told him I did not prescribe. Ultimately there are no further surgical or nonsurgical options, he may need to go on a neuropathic pain medication or perhaps be referred to a pain management specialist. He is also looking for a new primary care provider as his primary care provider is close to retirement. He reports he has cut back his smoking to about 1 pack every 2 weeks. He is also been out of amlodipine and simvastatin although his blood pressure is well controlled.  I saw Mr. Strebeck in the office today. He is reporting now complains of shortness of breath and chest pain. He also feels like his breathing is worse when working around chemicals at work. He uses a respiratory protection mask but also feels like he is under significant stress. He's had missed several days of work and is concerned about his job. Sounds like he is in a difficult work environment. He says that he is apologizing for "deceiving me" that he was not honest about his symptoms. He apparently has been having shortness of breath and chest pain for several months and did not relay that to me.  Mr. Comer returns to the office today for follow-up. His main complaint is shortness of breath. He's not feeling as much chest pain he's had been previously. We recently did a nuclear stress test which was negative for ischemia and showed an EF of 56%. With regards to shortness of breath he's concerned most of this was related to chemicals at work although  he has not worked in several months. He's had about 20 pound weight gain since we last saw him in the office and denies any lower extremity swelling, orthopnea or PND. Shortness of breath is persistent and is associated with some wheezing. He did see Dr. 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 Shortness of breath seems to be a little bit worse with exertion which makes me wonder whether there is an element of exercise-induced pulmonary hypertension. He's not had an echocardiogram in some time.  PMHx:  Past Medical History  Diagnosis Date  . Myocardial infarction     2002; treated with stent to mid L circumflex  . Heart disease   . Hypercholesteremia   . Coronary artery disease   . NSTEMI (non-ST elevated myocardial infarction) 10/18/2011  . CAD (coronary artery disease), native coronary artery 04/18/2008  . Diabetes mellitus     Takes Metformin  . Hypertension     Takes Lisinopril   . GERD (gastroesophageal reflux disease)     Takes Dexilant  . Angina     Takes Isosorbide & Nitroglycerin  . Headache(784.0)   . Pneumonia     approx 2 years ago  . Bronchitis     hx of  . Shortness of breath     Occurs while laying down at times  . History of nuclear stress test 07/29/2011    decreased activity in septum on stress images compared with rest; wall motion abnormality, EF 53%  . Rhabdomyolysis     h/o, r/t statins  . H/O cardiac catheterization     with 5 separate dated interventions  . Chronic leg pain     bilateral  . Chronic back pain     Past Surgical History  Procedure Laterality Date  . Coronary angioplasty with stent placement  10/09/2001    PTCA & stenting of mid AV circumflex; 2.5x41m Pixel stent  . Colonoscopy w/ polypectomy    . Lumbar laminectomy/decompression microdiscectomy  03/31/2012    Procedure: LUMBAR LAMINECTOMY/DECOMPRESSION MICRODISCECTOMY 1 LEVEL;  Surgeon: HCharlie Pitter MD;  [facility];  Service: Neurosurgery;  Laterality: Left;  . Transthoracic echocardiogram  07/28/2011    EF 55-65%; LVH, grade 1 diastolic dysfunction;   . Coronary angioplasty with stent placement  12/13/2001    PCI with stent to mid L circumflex, 95% stenosis to 0% residual  . Coronary angioplasty with stent placement  10/10/2003    PCI to  mid AV circumflex; LAD 30% disease; RCA 100% occluded prox.  . Coronary angioplasty with stent placement  09/01/2011    PCI with stenting with bare metal stent to mid AV groove circumflex and PDA  . Coronary angioplasty with stent placement  10/17/2011    cutting balloon angioplasty of ostial lateral OM1 branch and bifurcation AV groove circumflex OM junction; stenosis reduced to 0%  . Left heart catheterization with coronary angiogram N/A 10/18/2011    Procedure: LEFT HEART CATHETERIZATION WITH CORONARY ANGIOGRAM;  Surgeon: DLeonie Man MD;  [facility];  Service: Cardiovascular;  Laterality: N/A;    FAMHx:  Family History  Problem Relation Age of Onset  . Anesthesia problems Neg Hx   . Hypotension Neg Hx   . Malignant hyperthermia Neg Hx   . Pseudochol deficiency Neg Hx   . Hypertension Mother   . Heart attack Father   .  Diabetes Mother   . Heart disease Brother     x 3   . Heart attack Brother     deceased  . Hypertension Sister   . Diabetes Sister     SOCHx:   reports that he quit smoking about 2 months ago. His smoking use included Cigarettes. He has a 6.25 pack-year smoking history. He has never used smokeless tobacco. He reports that he does not drink alcohol or use illicit drugs.  ALLERGIES:  Allergies  Allergen Reactions  . Iohexol     PT. TO BE PREMEDICATED PRIOR TO IV CONTRAST PER DR Kris Hartmann /MMS//12/15/15Desc: PT BECAME SOB AND CHEST TIGHTNESS AFTER CONTRAST INJECTION.  [name],RT-RCT., Onset Date: [id number]   . Vicodin [Hydrocodone-Acetaminophen] Itching and Rash    ROS: A comprehensive review of systems was negative except for: Constitutional: positive for fatigue Cardiovascular: positive for lower extremity edema Musculoskeletal: positive for arthralgias, back pain and neuropathy  HOME MEDS: Current Outpatient Prescriptions  Medication Sig Dispense Refill  . acetaminophen (TYLENOL) 500 MG tablet Take 1,000 mg by mouth every 6 (six)  hours as needed for headache.    . Albuterol Sulfate (PROAIR RESPICLICK) 742 (90 BASE) MCG/ACT AEPB Inhale 1 puff into the lungs every 6 (six) hours as needed (shortness of breath). 1 each 5  . aspirin 325 MG tablet Take 325 mg by mouth daily.    . clopidogrel (PLAVIX) 75 MG tablet Take 1 tablet (75 mg total) by mouth daily. 90 tablet 2  . cyclobenzaprine (FLEXERIL) 10 MG tablet Take 1 tablet (10 mg total) by mouth 3 (three) times daily as needed for muscle spasms. 15 tablet 0  . isosorbide mononitrate (IMDUR) 30 MG 24 hr tablet Take 1 tablet (30 mg total) by mouth 2 (two) times daily. 180 tablet 3  . lisinopril (PRINIVIL,ZESTRIL) 20 MG tablet Take 1 tablet (20 mg total) by mouth daily. 90 tablet 3  . metFORMIN (GLUCOPHAGE) 1000 MG tablet Take 1,000 mg by mouth 2 (two) times daily with a meal.     . metoprolol (LOPRESSOR) 50 MG tablet Take 1 tablet (50 mg total) by mouth 2 (two) times daily. 180 tablet 3  . naproxen (NAPROSYN) 500 MG tablet Take 0.5 tablets (250 mg total) by mouth 2 (two) times daily as needed for mild pain or moderate pain (TAKE WITH MEALS.). 10 tablet 0  . nitroGLYCERIN (NITROSTAT) 0.4 MG SL tablet Place 1 tablet (0.4 mg total) under the tongue every 5 (five) minutes as needed for chest pain. 25 tablet 3  . simvastatin (ZOCOR) 20 MG tablet Take 1 tablet (20 mg total) by mouth at bedtime. 90 tablet 3  . valsartan-hydrochlorothiazide (DIOVAN-HCT) 320-12.5 MG per tablet Take 1 tablet by mouth daily. 90 tablet 3   No current facility-administered medications for this visit.    LABS/IMAGING: No results found for this or any previous visit (from the past 48 hour(s)). No results found.  VITALS: BP 152/90 mmHg  Pulse 82  Ht '5\' 8"'$  (1.727 m)  Wt 271 lb 1.6 oz (122.97 kg)  BMI 41.23 kg/m2  EXAM: General appearance: alert and no distress Neck: no adenopathy, no carotid bruit, no JVD, supple, symmetrical, trachea midline and thyroid not enlarged, symmetric, no  tenderness/mass/nodules Lungs: wheezes bilaterally Heart: regular rate and rhythm, S1, S2 normal, no murmur, click, rub or gallop Abdomen: soft, non-tender; bowel sounds normal; no masses,  no organomegaly Extremities: extremities normal, atraumatic, no cyanosis or edema Pulses: 2+ and symmetric Skin: Skin color, texture, turgor normal.  No rashes or lesions Neurologic: Grossly normal  EKG: Normal sinus rhythm at 82  ASSESSMENT:  1. Dyspnea on exertion 2. Coronary artery disease status post PCI and cutting balloon angioplasty over 2 years ago - negative Lexiscan for ischemia 3. Ongoing tobacco abuse 4. Hypertension 5. Dyslipidemia 6. Neuropathy 7. Persistent low back pain 8. Diabetes type 2 9. Morbid obesity  PLAN: 1.   Mr. Battaglini has had persistent dyspnea on exertion and chest discomfort recently. He had a negative nuclear stress test and seems to be more significantly concerned about shortness of breath. He's been evaluated by pulmonary and is felt to have emphysema and is now on inhalers with minimal benefit. Some of his shortness of breath is associated with exertion and I like to repeat an echo to look for any pulmonary hypertension or significant diastolic dysfunction. We'll go ahead and check a basic metabolic profile and BNP today as well.  Plan to see him back in 2 months.  Pixie Casino, MD, [facility] Attending Cardiologist [place] Hilty 08/15/2015, 9:52 AM

## 2015-08-16 LAB — BRAIN NATRIURETIC PEPTIDE: Brain Natriuretic Peptide: 51.4 pg/mL (ref 0.0–100.0)

## 2015-08-26 ENCOUNTER — Other Ambulatory Visit (HOSPITAL_COMMUNITY): Payer: BC Managed Care – PPO | Admitting: *Deleted

## 2015-08-26 ENCOUNTER — Other Ambulatory Visit: Payer: Self-pay

## 2015-08-26 ENCOUNTER — Ambulatory Visit (HOSPITAL_COMMUNITY): Payer: BC Managed Care – PPO | Attending: Cardiology

## 2015-08-26 DIAGNOSIS — R0602 Shortness of breath: Secondary | ICD-10-CM | POA: Diagnosis not present

## 2015-08-26 DIAGNOSIS — I517 Cardiomegaly: Secondary | ICD-10-CM | POA: Diagnosis not present

## 2015-08-26 DIAGNOSIS — I340 Nonrheumatic mitral (valve) insufficiency: Secondary | ICD-10-CM | POA: Insufficient documentation

## 2015-08-26 DIAGNOSIS — I5189 Other ill-defined heart diseases: Secondary | ICD-10-CM | POA: Diagnosis not present

## 2015-08-26 DIAGNOSIS — I371 Nonrheumatic pulmonary valve insufficiency: Secondary | ICD-10-CM | POA: Diagnosis not present

## 2015-08-26 MED ORDER — PERFLUTREN LIPID MICROSPHERE
1.0000 mL | INTRAVENOUS | Status: AC | PRN
  Administered 2015-08-26: 1 mL via INTRAVENOUS

## 2015-08-28 ENCOUNTER — Telehealth: Payer: Self-pay | Admitting: *Deleted

## 2015-08-28 DIAGNOSIS — Z79899 Other long term (current) drug therapy: Secondary | ICD-10-CM

## 2015-08-28 MED ORDER — FUROSEMIDE 20 MG PO TABS: 20.0000 mg | ORAL_TABLET | Freq: Every day | ORAL | Status: DC

## 2015-08-28 NOTE — Telephone Encounter (Signed)
Patient notified of echo results and MD advice. He was instructed to take lasix '20mg'$  daily and have lab work 4-5 days after starting the med.   Patient requested to see MD sooner than 11/23 - OV scheduled for 09/12/15

## 2015-08-28 NOTE — Telephone Encounter (Signed)
-----   Message from Pixie Casino, MD sent at 08/28/2015  1:17 PM EDT ----- Echo shows diastolic dysfunction which could explain some shortness of breath.  Would have him try lasix 20 mg daily to see if it helps his shortness of breath - check BMET in 4-5 days after starting. BNP was low - but this may not be accurate.  Dr. Lemmie Evens

## 2015-09-01 ENCOUNTER — Telehealth: Payer: Self-pay | Admitting: Internal Medicine

## 2015-09-01 NOTE — Telephone Encounter (Signed)
Called patient to see if I could assist w/ his issue. He states that he has been unable to urinate - I asked for clarification on what he meant. He states every time he goes to the bathroom, "only a few drops come out". Reports weak, dribbling stream. Reports he felt this way prior to starting lasix - has been since last Wednesday or Thursday that he has been having issues. He reports having difficulty every time he urinates/attempts to urinate. He reports feeling bloated, tight in belly/bladder area. Feels this is getting worse.  Pt also relates cough w/ diminished SOB. Advised if he is having increased difficulty urinating and w/ symptoms, that this would be urgent problem and something most appropriately handled in the hospital.  Recommended ER evaluation. Pt voiced understanding.  Called Trish to inform, did clarify complaint not primarily cardiology related.  Routing to Dr. Debara Pickett as Juluis Rainier.

## 2015-09-01 NOTE — Telephone Encounter (Signed)
Patient wants to speak with Jenna---he would not leave a message.

## 2015-09-12 ENCOUNTER — Ambulatory Visit: Payer: Self-pay | Admitting: Internal Medicine

## 2015-09-23 ENCOUNTER — Ambulatory Visit (INDEPENDENT_AMBULATORY_CARE_PROVIDER_SITE_OTHER): Payer: BC Managed Care – PPO | Admitting: Internal Medicine

## 2015-09-23 ENCOUNTER — Encounter: Payer: Self-pay | Admitting: Internal Medicine

## 2015-09-23 ENCOUNTER — Encounter: Payer: Self-pay | Admitting: Cardiology

## 2015-09-23 VITALS — BP 156/90 | HR 78 | Ht 68.0 in | Wt 274.3 lb

## 2015-09-23 DIAGNOSIS — Z01812 Encounter for preprocedural laboratory examination: Secondary | ICD-10-CM | POA: Diagnosis not present

## 2015-09-23 DIAGNOSIS — D689 Coagulation defect, unspecified: Secondary | ICD-10-CM | POA: Diagnosis not present

## 2015-09-23 DIAGNOSIS — R5383 Other fatigue: Secondary | ICD-10-CM

## 2015-09-23 DIAGNOSIS — I25110 Atherosclerotic heart disease of native coronary artery with unstable angina pectoris: Secondary | ICD-10-CM | POA: Diagnosis not present

## 2015-09-23 DIAGNOSIS — E669 Obesity, unspecified: Secondary | ICD-10-CM

## 2015-09-23 DIAGNOSIS — Z79899 Other long term (current) drug therapy: Secondary | ICD-10-CM | POA: Diagnosis not present

## 2015-09-23 DIAGNOSIS — I10 Essential (primary) hypertension: Secondary | ICD-10-CM

## 2015-09-23 DIAGNOSIS — R079 Chest pain, unspecified: Secondary | ICD-10-CM

## 2015-09-23 DIAGNOSIS — R0602 Shortness of breath: Secondary | ICD-10-CM

## 2015-09-23 NOTE — Patient Instructions (Signed)
Your physician has requested that you have a cardiac catheterization @ [facility] with Dr. Ellyn Hack this week. Cardiac catheterization is used to diagnose and/or treat various heart conditions. Doctors may recommend this procedure for a number of different reasons. The most common reason is to evaluate chest pain. Chest pain can be a symptom of coronary artery disease (CAD), and cardiac catheterization can show whether plaque is narrowing or blocking your heart's arteries. This procedure is also used to evaluate the valves, as well as measure the blood flow and oxygen levels in different parts of your heart. For further information please visit [email or web address]. Please follow instruction sheet, as given.  Following your catheterization, you will not be allowed to drive for 3 days.  No lifting, pushing, or pulling greater that 10 pounds is allowed for 1 week.  You will be required to have the following tests prior to the procedure:  Blood work - the blood work can be done no more than 7 days prior to the procedure.  It can be done at any [facility] lab. There is a lab downstairs on the first floor of this building in [address] and one at [employer].

## 2015-09-23 NOTE — Progress Notes (Signed)
OFFICE NOTE  Chief Complaint:  Shortness of breath, chest pain, heaviness  Primary Care Physician: Vonna Drafts., FNP  HPI:  Andrew West  is a 49 year old gentleman with a history of coronary artery disease and stent placement to the circumflex and obtuse marginal and a history of in stent restenosis to the LAD. There is also a nondominant right coronary artery that is 100% occluded, filled with collaterals. He did have a stress test recently, which showed an EF of 53% and reversible septal ischemia in the setting of chest pain and after much convincing underwent cardiac catheterization.  He then underwent cutting balloon angioplasty to an ostial lateral OM1 branch and the bifurcation AV groove circumflex OM junction. This was successful at reducing the stenosis to 0%. This was in November 2012 and he did not return for followup appointment until April 2013. He was suffering from low back pain and has been evaluated and treated by Dr. Trenton Gammon with a laminectomy/discectomy, which he says was not helpful. Otherwise, he continues to smoke and occasionally complains of shortness of breath and some chest pain symptoms which are atypical. He is on long-acting and/or has not needed to take short-acting nitroglycerin. His other concern today is that he is having problems with his teeth and was recommended to have edentulation and by an oral surgeon Dr. Diona Browner. Finally, he is suffering from significant stress and depression due to recent separation with wife in dealing with his kids. This seems to be a big tablets on his continued smoking.  [name] returns today in the office. He feels fairly well. He denies any chest pain. He continues to have problems with low back pain and numbness and tingling in his legs. He is apparently status post laminectomy and microdiscectomy by Dr. Trenton Gammon and continues to have symptoms which may be related to that. He's also complaining of what sounds  like neuropathic pain. He is not currently on medication. He is asking for Tylenol 3 for pain today which I told him I did not prescribe. Ultimately there are no further surgical or nonsurgical options, he may need to go on a neuropathic pain medication or perhaps be referred to a pain management specialist. He is also looking for a new primary care provider as his primary care provider is close to retirement. He reports he has cut back his smoking to about 1 pack every 2 weeks. He is also been out of amlodipine and simvastatin although his blood pressure is well controlled.  I saw [name] in the office today. He is reporting now complains of shortness of breath and chest pain. He also feels like his breathing is worse when working around chemicals at work. He uses a respiratory protection mask but also feels like he is under significant stress. He's had missed several days of work and is concerned about his job. Sounds like he is in a difficult work environment. He says that he is apologizing for "deceiving me" that he was not honest about his symptoms. He apparently has been having shortness of breath and chest pain for several months and did not relay that to me.  [name] returns to the office today for follow-up. His main complaint is shortness of breath. He's not feeling as much chest pain he's had been previously. We recently did a nuclear stress test which was negative for ischemia and showed an EF of 56%. With regards to shortness of breath he's concerned most of this was related to chemicals  at work although he has not worked in several months. He's had about 20 pound weight gain since we last saw him in the office and denies any lower extremity swelling, orthopnea or PND. Shortness of breath is persistent and is associated with some wheezing. He did see Dr. 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 Shortness of breath seems to be a little bit worse with exertion which makes me wonder whether there is an element of exercise-induced pulmonary hypertension. He's not had an echocardiogram in some time.  [name] returns today for follow-up. He was noted to have some diastolic dysfunction on his echocardiogram. I started him on low-dose Lasix and check lab work including a BNP which is very low around 50. On follow-up today he reports he feels no different with the addition of Lasix. I therefore asked him to take it as needed. He is also describing worsening substernal chest discomfort and a squeezing pressure in his chest today. He says this is been getting worse over the past several days, more than his typical chest discomfort. As previously noted he recently underwent a nuclear stress test a few months ago which was negative for ischemia.  PMHx:  Past Medical History  Diagnosis Date  . Myocardial infarction ([place])     2002; treated with stent to mid L circumflex  . Heart disease   . Hypercholesteremia   . Coronary artery disease   . NSTEMI (non-ST elevated myocardial infarction) ([place]) 10/18/2011  . CAD (coronary artery disease), native coronary artery 04/18/2008  . Diabetes mellitus     Takes Metformin  . Hypertension     Takes Lisinopril   . GERD (gastroesophageal reflux disease)     Takes Dexilant  . Angina     Takes Isosorbide & Nitroglycerin  . Headache(784.0)   . Pneumonia     approx 2 years ago  . Bronchitis     hx of  . Shortness of breath     Occurs while laying down at times  . History of nuclear stress test 07/29/2011    decreased activity in septum on stress images compared with rest; wall motion abnormality, EF 53%  . Rhabdomyolysis     h/o, r/t statins  . H/O cardiac catheterization     with 5 separate dated interventions  . Chronic leg pain     bilateral  . Chronic back pain     Past Surgical History  Procedure  Laterality Date  . Coronary angioplasty with stent placement  10/09/2001    PTCA & stenting of mid AV circumflex; 2.5x68m Pixel stent  . Colonoscopy w/ polypectomy    . Lumbar laminectomy/decompression microdiscectomy  03/31/2012    Procedure: LUMBAR LAMINECTOMY/DECOMPRESSION MICRODISCECTOMY 1 LEVEL;  Surgeon: HCharlie Pitter MD;  [facility];  Service: Neurosurgery;  Laterality: Left;  . Transthoracic echocardiogram  07/28/2011    EF 55-65%; LVH, grade 1 diastolic dysfunction;   . Coronary angioplasty with stent placement  12/13/2001    PCI with stent to mid L circumflex, 95% stenosis to 0% residual  . Coronary angioplasty with stent placement  10/10/2003    PCI to mid AV circumflex; LAD 30% disease; RCA 100% occluded prox.  . Coronary angioplasty with stent placement  09/01/2011    PCI with stenting with bare metal stent to mid AV groove circumflex and PDA  .  Coronary angioplasty with stent placement  10/17/2011    cutting balloon angioplasty of ostial lateral OM1 branch and bifurcation AV groove circumflex OM junction; stenosis reduced to 0%  . Left heart catheterization with coronary angiogram N/A 10/18/2011    Procedure: LEFT HEART CATHETERIZATION WITH CORONARY ANGIOGRAM;  Surgeon: Leonie Man, MD;  Location: [facility] CATH LAB;  Service: Cardiovascular;  Laterality: N/A;    FAMHx:  Family History  Problem Relation Age of Onset  . Anesthesia problems Neg Hx   . Hypotension Neg Hx   . Malignant hyperthermia Neg Hx   . Pseudochol deficiency Neg Hx   . Hypertension Mother   . Heart attack Father   . Diabetes Mother   . Heart disease Brother     x 3   . Heart attack Brother     deceased  . Hypertension Sister   . Diabetes Sister     SOCHx:   reports that he quit smoking about 4 months ago. His smoking use included Cigarettes. He has a 6.25 pack-year smoking history. He has never used smokeless tobacco. He reports that he does not drink alcohol or use illicit  drugs.  ALLERGIES:  Allergies  Allergen Reactions  . Iohexol     PT. TO BE PREMEDICATED PRIOR TO IV CONTRAST PER DR Kris Hartmann /MMS//12/15/15Desc: PT BECAME SOB AND CHEST TIGHTNESS AFTER CONTRAST INJECTION.  [name],RT-RCT., Onset Date: [id number]   . Vicodin [Hydrocodone-Acetaminophen] Itching and Rash    ROS: A comprehensive review of systems was negative except for: Constitutional: positive for fatigue Cardiovascular: positive for chest pressure/discomfort, dyspnea and lower extremity edema Musculoskeletal: positive for arthralgias, back pain and neuropathy  HOME MEDS: Current Outpatient Prescriptions  Medication Sig Dispense Refill  . acetaminophen (TYLENOL) 500 MG tablet Take 1,000 mg by mouth every 6 (six) hours as needed for headache.    . Albuterol Sulfate (PROAIR RESPICLICK) 093 (90 BASE) MCG/ACT AEPB Inhale 1 puff into the lungs every 6 (six) hours as needed (shortness of breath). 1 each 5  . aspirin 325 MG tablet Take 325 mg by mouth daily.    . clopidogrel (PLAVIX) 75 MG tablet Take 1 tablet (75 mg total) by mouth daily. 90 tablet 2  . cyclobenzaprine (FLEXERIL) 10 MG tablet Take 1 tablet (10 mg total) by mouth 3 (three) times daily as needed for muscle spasms. 15 tablet 0  . furosemide (LASIX) 20 MG tablet Take 20 mg by mouth daily as needed for fluid or edema.    . isosorbide mononitrate (IMDUR) 30 MG 24 hr tablet Take 1 tablet (30 mg total) by mouth 2 (two) times daily. 180 tablet 3  . lisinopril (PRINIVIL,ZESTRIL) 20 MG tablet Take 1 tablet (20 mg total) by mouth daily. 90 tablet 3  . metFORMIN (GLUCOPHAGE) 1000 MG tablet Take 1,000 mg by mouth 2 (two) times daily with a meal.     . metoprolol (LOPRESSOR) 50 MG tablet Take 1 tablet (50 mg total) by mouth 2 (two) times daily. 180 tablet 3  . naproxen (NAPROSYN) 500 MG tablet Take 0.5 tablets (250 mg total) by mouth 2 (two) times daily as needed for mild pain or moderate pain (TAKE WITH MEALS.). 10 tablet 0  .  nitroGLYCERIN (NITROSTAT) 0.4 MG SL tablet Place 1 tablet (0.4 mg total) under the tongue every 5 (five) minutes as needed for chest pain. 25 tablet 3  . simvastatin (ZOCOR) 20 MG tablet Take 1 tablet (20 mg total) by mouth at bedtime. 90 tablet 3  .  valsartan-hydrochlorothiazide (DIOVAN-HCT) 320-12.5 MG per tablet Take 1 tablet by mouth daily. 90 tablet 3   No current facility-administered medications for this visit.    LABS/IMAGING: No results found for this or any previous visit (from the past 48 hour(s)). No results found.  VITALS: BP 156/90 mmHg  Pulse 78  Ht '5\' 8"'$  (1.727 m)  Wt 274 lb 4.8 oz (124.422 kg)  BMI 41.72 kg/m2  EXAM: Deferred  EKG: Normal sinus rhythm at 78, nonspecific T wave changes   ASSESSMENT:  1. Chest pain and dyspnea on exertion 2. Coronary artery disease status post PCI and cutting balloon angioplasty over 2 years ago - negative Lexiscan for ischemia 3. Ongoing tobacco abuse 4. Hypertension 5. Dyslipidemia 6. Neuropathy 7. Persistent low back pain 8. Diabetes type 2 9. Morbid obesity  PLAN: 1.   Mr. Mccathern continues to have shortness of breath and chest pain. There was no benefit from addition of diuretics. He is requesting a cardiac catheterization. There is low likelihood of a false-negative stress test however this could be possible and definitive cardiac catheterization may be helpful to evaluate for obstructive coronary disease. I'll ask my partner Dr. Ellyn Hack who did his original catheterization to recath him later this week based on his availability. He will need contrast dye prophylaxis given a history of dye allergy in the past. Plan to follow-up with him after his catheterization.  Pixie Casino, MD, [facility] Attending Cardiologist [place] Kerin Kren 09/23/2015, 5:16 PM

## 2015-09-24 DIAGNOSIS — I200 Unstable angina: Secondary | ICD-10-CM | POA: Diagnosis present

## 2015-09-24 LAB — PROTIME-INR
INR: 0.99 (ref <1.50)
PROTHROMBIN TIME: 13.2 s (ref 11.6–15.2)

## 2015-09-24 LAB — CBC
HCT: 44.3 % (ref 39.0–52.0)
HEMOGLOBIN: 15 g/dL (ref 13.0–17.0)
MCH: 26.9 pg (ref 26.0–34.0)
MCHC: 33.9 g/dL (ref 30.0–36.0)
MCV: 79.4 fL (ref 78.0–100.0)
MPV: 10.1 fL (ref 8.6–12.4)
PLATELETS: 265 10*3/uL (ref 150–400)
RBC: 5.58 MIL/uL (ref 4.22–5.81)
RDW: 12.9 % (ref 11.5–15.5)
WBC: 6 10*3/uL (ref 4.0–10.5)

## 2015-09-24 LAB — APTT: APTT: 36 s (ref 24–37)

## 2015-09-24 LAB — TSH: TSH: 0.871 u[IU]/mL (ref 0.350–4.500)

## 2015-09-24 LAB — BASIC METABOLIC PANEL
BUN: 10 mg/dL (ref 7–25)
CALCIUM: 8.7 mg/dL (ref 8.6–10.3)
CO2: 26 mmol/L (ref 20–31)
Chloride: 105 mmol/L (ref 98–110)
Creat: 0.82 mg/dL (ref 0.60–1.35)
GLUCOSE: 165 mg/dL — AB (ref 65–99)
POTASSIUM: 4.1 mmol/L (ref 3.5–5.3)
SODIUM: 138 mmol/L (ref 135–146)

## 2015-09-25 ENCOUNTER — Encounter (HOSPITAL_COMMUNITY)
Admission: RE | Disposition: A | Discharge status: Home / Self Care | Payer: Self-pay | Source: Ambulatory Visit | Attending: Cardiology

## 2015-09-25 ENCOUNTER — Ambulatory Visit (HOSPITAL_COMMUNITY)
Admission: RE | Admit: 2015-09-25 | Discharge: 2015-09-26 | Disposition: A | Discharge status: Home / Self Care | Payer: BC Managed Care – PPO | Source: Ambulatory Visit | Attending: Cardiology | Admitting: Cardiology

## 2015-09-25 ENCOUNTER — Encounter (HOSPITAL_COMMUNITY): Payer: Self-pay | Admitting: General Practice

## 2015-09-25 DIAGNOSIS — Z7984 Long term (current) use of oral hypoglycemic drugs: Secondary | ICD-10-CM | POA: Diagnosis not present

## 2015-09-25 DIAGNOSIS — I252 Old myocardial infarction: Secondary | ICD-10-CM | POA: Diagnosis not present

## 2015-09-25 DIAGNOSIS — Y838 Other surgical procedures as the cause of abnormal reaction of the patient, or of later complication, without mention of misadventure at the time of the procedure: Secondary | ICD-10-CM | POA: Insufficient documentation

## 2015-09-25 DIAGNOSIS — I25110 Atherosclerotic heart disease of native coronary artery with unstable angina pectoris: Secondary | ICD-10-CM | POA: Diagnosis not present

## 2015-09-25 DIAGNOSIS — I10 Essential (primary) hypertension: Secondary | ICD-10-CM | POA: Diagnosis not present

## 2015-09-25 DIAGNOSIS — E1140 Type 2 diabetes mellitus with diabetic neuropathy, unspecified: Secondary | ICD-10-CM | POA: Insufficient documentation

## 2015-09-25 DIAGNOSIS — Z8249 Family history of ischemic heart disease and other diseases of the circulatory system: Secondary | ICD-10-CM | POA: Diagnosis not present

## 2015-09-25 DIAGNOSIS — Z7982 Long term (current) use of aspirin: Secondary | ICD-10-CM | POA: Insufficient documentation

## 2015-09-25 DIAGNOSIS — J449 Chronic obstructive pulmonary disease, unspecified: Secondary | ICD-10-CM | POA: Diagnosis not present

## 2015-09-25 DIAGNOSIS — K219 Gastro-esophageal reflux disease without esophagitis: Secondary | ICD-10-CM | POA: Diagnosis present

## 2015-09-25 DIAGNOSIS — T82855A Stenosis of coronary artery stent, initial encounter: Secondary | ICD-10-CM | POA: Diagnosis not present

## 2015-09-25 DIAGNOSIS — Z6841 Body Mass Index (BMI) 40.0 and over, adult: Secondary | ICD-10-CM | POA: Insufficient documentation

## 2015-09-25 DIAGNOSIS — I25118 Atherosclerotic heart disease of native coronary artery with other forms of angina pectoris: Secondary | ICD-10-CM | POA: Diagnosis not present

## 2015-09-25 DIAGNOSIS — E669 Obesity, unspecified: Secondary | ICD-10-CM | POA: Diagnosis present

## 2015-09-25 DIAGNOSIS — Z87891 Personal history of nicotine dependence: Secondary | ICD-10-CM | POA: Insufficient documentation

## 2015-09-25 DIAGNOSIS — E785 Hyperlipidemia, unspecified: Secondary | ICD-10-CM | POA: Insufficient documentation

## 2015-09-25 DIAGNOSIS — E119 Type 2 diabetes mellitus without complications: Secondary | ICD-10-CM

## 2015-09-25 DIAGNOSIS — Z885 Allergy status to narcotic agent status: Secondary | ICD-10-CM | POA: Diagnosis not present

## 2015-09-25 DIAGNOSIS — Z955 Presence of coronary angioplasty implant and graft: Secondary | ICD-10-CM | POA: Insufficient documentation

## 2015-09-25 DIAGNOSIS — I2582 Chronic total occlusion of coronary artery: Secondary | ICD-10-CM | POA: Insufficient documentation

## 2015-09-25 DIAGNOSIS — R079 Chest pain, unspecified: Secondary | ICD-10-CM

## 2015-09-25 DIAGNOSIS — Z79899 Other long term (current) drug therapy: Secondary | ICD-10-CM | POA: Insufficient documentation

## 2015-09-25 DIAGNOSIS — F17200 Nicotine dependence, unspecified, uncomplicated: Secondary | ICD-10-CM | POA: Diagnosis present

## 2015-09-25 DIAGNOSIS — F419 Anxiety disorder, unspecified: Secondary | ICD-10-CM

## 2015-09-25 DIAGNOSIS — I2510 Atherosclerotic heart disease of native coronary artery without angina pectoris: Secondary | ICD-10-CM | POA: Diagnosis present

## 2015-09-25 DIAGNOSIS — J45909 Unspecified asthma, uncomplicated: Secondary | ICD-10-CM | POA: Insufficient documentation

## 2015-09-25 DIAGNOSIS — Z7902 Long term (current) use of antithrombotics/antiplatelets: Secondary | ICD-10-CM | POA: Insufficient documentation

## 2015-09-25 DIAGNOSIS — I200 Unstable angina: Secondary | ICD-10-CM | POA: Diagnosis present

## 2015-09-25 DIAGNOSIS — F329 Major depressive disorder, single episode, unspecified: Secondary | ICD-10-CM | POA: Diagnosis present

## 2015-09-25 HISTORY — DX: Depression, unspecified: F32.A

## 2015-09-25 HISTORY — PX: CARDIAC CATHETERIZATION: SHX172

## 2015-09-25 HISTORY — DX: Anxiety disorder, unspecified: F41.9

## 2015-09-25 HISTORY — PX: CORONARY ANGIOPLASTY: SHX604

## 2015-09-25 HISTORY — DX: Major depressive disorder, single episode, unspecified: F32.9

## 2015-09-25 LAB — POCT ACTIVATED CLOTTING TIME: Activated Clotting Time: 841 seconds

## 2015-09-25 LAB — GLUCOSE, CAPILLARY
GLUCOSE-CAPILLARY: 114 mg/dL — AB (ref 65–99)
Glucose-Capillary: 158 mg/dL — ABNORMAL HIGH (ref 65–99)
Glucose-Capillary: 245 mg/dL — ABNORMAL HIGH (ref 65–99)
Glucose-Capillary: 271 mg/dL — ABNORMAL HIGH (ref 65–99)

## 2015-09-25 SURGERY — LEFT HEART CATH AND CORONARY ANGIOGRAPHY

## 2015-09-25 MED ORDER — NITROGLYCERIN 0.4 MG SL SUBL
0.4000 mg | SUBLINGUAL_TABLET | SUBLINGUAL | Status: DC | PRN
  Administered 2015-09-25: 0.4 mg via SUBLINGUAL
  Filled 2015-09-25 (×2): qty 25

## 2015-09-25 MED ORDER — METHYLPREDNISOLONE SODIUM SUCC 125 MG IJ SOLR
INTRAMUSCULAR | Status: AC
  Administered 2015-09-25: 125 mg via INTRAVENOUS
  Filled 2015-09-25: qty 2

## 2015-09-25 MED ORDER — BIVALIRUDIN 250 MG IV SOLR
INTRAVENOUS | Status: AC
  Filled 2015-09-25: qty 250

## 2015-09-25 MED ORDER — IRBESARTAN 300 MG PO TABS
300.0000 mg | ORAL_TABLET | Freq: Every day | ORAL | Status: DC
  Administered 2015-09-25 – 2015-09-26 (×2): 300 mg via ORAL
  Filled 2015-09-25 (×2): qty 1

## 2015-09-25 MED ORDER — HEPARIN SODIUM (PORCINE) 1000 UNIT/ML IJ SOLN
INTRAMUSCULAR | Status: DC | PRN
  Administered 2015-09-25: 6000 [IU] via INTRAVENOUS

## 2015-09-25 MED ORDER — DIPHENHYDRAMINE HCL 50 MG/ML IJ SOLN
25.0000 mg | INTRAMUSCULAR | Status: AC
  Administered 2015-09-25: 25 mg via INTRAVENOUS

## 2015-09-25 MED ORDER — NITROGLYCERIN 0.4 MG SL SUBL: 0.4000 mg | SUBLINGUAL_TABLET | SUBLINGUAL | Status: DC | PRN

## 2015-09-25 MED ORDER — MIDAZOLAM HCL 2 MG/2ML IJ SOLN
INTRAMUSCULAR | Status: DC | PRN
  Administered 2015-09-25 (×2): 1 mg via INTRAVENOUS
  Administered 2015-09-25: 2 mg via INTRAVENOUS

## 2015-09-25 MED ORDER — FAMOTIDINE IN NACL 20-0.9 MG/50ML-% IV SOLN
20.0000 mg | INTRAVENOUS | Status: AC
  Administered 2015-09-25: 20 mg via INTRAVENOUS

## 2015-09-25 MED ORDER — HEPARIN SODIUM (PORCINE) 1000 UNIT/ML IJ SOLN
INTRAMUSCULAR | Status: AC
  Filled 2015-09-25: qty 1

## 2015-09-25 MED ORDER — FENTANYL CITRATE (PF) 100 MCG/2ML IJ SOLN
INTRAMUSCULAR | Status: AC
  Filled 2015-09-25: qty 4

## 2015-09-25 MED ORDER — BIVALIRUDIN BOLUS VIA INFUSION - CUPID
INTRAVENOUS | Status: DC | PRN
  Administered 2015-09-25: 88.425 mg via INTRAVENOUS

## 2015-09-25 MED ORDER — MORPHINE SULFATE (PF) 2 MG/ML IV SOLN
2.0000 mg | INTRAVENOUS | Status: DC | PRN
  Administered 2015-09-25 (×2): 2 mg via INTRAVENOUS
  Filled 2015-09-25 (×2): qty 1

## 2015-09-25 MED ORDER — ASPIRIN 81 MG PO CHEW
81.0000 mg | CHEWABLE_TABLET | Freq: Every day | ORAL | Status: DC
  Administered 2015-09-26: 81 mg via ORAL
  Filled 2015-09-25: qty 1

## 2015-09-25 MED ORDER — METHYLPREDNISOLONE SODIUM SUCC 125 MG IJ SOLR
125.0000 mg | INTRAMUSCULAR | Status: AC
  Administered 2015-09-25: 125 mg via INTRAVENOUS

## 2015-09-25 MED ORDER — DIPHENHYDRAMINE HCL 50 MG/ML IJ SOLN
INTRAMUSCULAR | Status: AC
  Administered 2015-09-25: 25 mg via INTRAVENOUS
  Filled 2015-09-25: qty 1

## 2015-09-25 MED ORDER — ISOSORBIDE MONONITRATE ER 30 MG PO TB24
30.0000 mg | ORAL_TABLET | Freq: Two times a day (BID) | ORAL | Status: DC
  Administered 2015-09-25 – 2015-09-26 (×2): 30 mg via ORAL
  Filled 2015-09-25 (×2): qty 1

## 2015-09-25 MED ORDER — SODIUM CHLORIDE 0.9 % WEIGHT BASED INFUSION
3.0000 mL/kg/h | INTRAVENOUS | Status: AC
  Administered 2015-09-25: 13:00:00 3 mL/kg/h via INTRAVENOUS

## 2015-09-25 MED ORDER — FUROSEMIDE 20 MG PO TABS: 20.0000 mg | ORAL_TABLET | Freq: Every day | ORAL | Status: DC | PRN

## 2015-09-25 MED ORDER — LISINOPRIL 20 MG PO TABS
20.0000 mg | ORAL_TABLET | Freq: Every day | ORAL | Status: DC
  Administered 2015-09-26: 20 mg via ORAL
  Filled 2015-09-25: qty 1
  Filled 2015-09-25: qty 2

## 2015-09-25 MED ORDER — LIDOCAINE HCL (PF) 1 % IJ SOLN
INTRAMUSCULAR | Status: AC
  Filled 2015-09-25: qty 30

## 2015-09-25 MED ORDER — SODIUM CHLORIDE 0.9 % IJ SOLN: 3.0000 mL | INTRAMUSCULAR | Status: DC | PRN

## 2015-09-25 MED ORDER — VALSARTAN-HYDROCHLOROTHIAZIDE 320-12.5 MG PO TABS: 1.0000 | ORAL_TABLET | Freq: Every day | ORAL | Status: DC

## 2015-09-25 MED ORDER — NITROGLYCERIN 1 MG/10 ML FOR IR/CATH LAB
INTRA_ARTERIAL | Status: AC
  Filled 2015-09-25: qty 10

## 2015-09-25 MED ORDER — OXYCODONE HCL 5 MG PO TABS
5.0000 mg | ORAL_TABLET | ORAL | Status: DC | PRN
  Administered 2015-09-25 – 2015-09-26 (×4): 5 mg via ORAL
  Filled 2015-09-25 (×4): qty 1

## 2015-09-25 MED ORDER — METOPROLOL TARTRATE 50 MG PO TABS
50.0000 mg | ORAL_TABLET | Freq: Two times a day (BID) | ORAL | Status: DC
  Administered 2015-09-25 – 2015-09-26 (×2): 50 mg via ORAL
  Filled 2015-09-25: qty 2
  Filled 2015-09-25: qty 1
  Filled 2015-09-25: qty 2
  Filled 2015-09-25 (×2): qty 1

## 2015-09-25 MED ORDER — MIDAZOLAM HCL 2 MG/2ML IJ SOLN
INTRAMUSCULAR | Status: AC
  Filled 2015-09-25: qty 4

## 2015-09-25 MED ORDER — SODIUM CHLORIDE 0.9 % IJ SOLN: 3.0000 mL | Freq: Two times a day (BID) | INTRAMUSCULAR | Status: DC

## 2015-09-25 MED ORDER — SODIUM CHLORIDE 0.9 % IV SOLN
250.0000 mg | INTRAVENOUS | Status: DC | PRN
  Administered 2015-09-25: 250 mg
  Administered 2015-09-25: 1.75 mg/kg/h via INTRAVENOUS

## 2015-09-25 MED ORDER — VERAPAMIL HCL 2.5 MG/ML IV SOLN
INTRAVENOUS | Status: AC
  Filled 2015-09-25: qty 2

## 2015-09-25 MED ORDER — INSULIN ASPART 100 UNIT/ML ~~LOC~~ SOLN
0.0000 [IU] | Freq: Three times a day (TID) | SUBCUTANEOUS | Status: DC
  Administered 2015-09-25: 17:00:00 5 [IU] via SUBCUTANEOUS
  Administered 2015-09-26 (×2): 3 [IU] via SUBCUTANEOUS

## 2015-09-25 MED ORDER — SIMVASTATIN 20 MG PO TABS
20.0000 mg | ORAL_TABLET | Freq: Every day | ORAL | Status: DC
  Administered 2015-09-25: 20 mg via ORAL
  Filled 2015-09-25: qty 1

## 2015-09-25 MED ORDER — SODIUM CHLORIDE 0.9 % IV SOLN
INTRAVENOUS | Status: DC
  Administered 2015-09-25: 08:00:00 via INTRAVENOUS

## 2015-09-25 MED ORDER — ASPIRIN 81 MG PO CHEW
CHEWABLE_TABLET | ORAL | Status: AC
  Administered 2015-09-25: 81 mg via ORAL
  Filled 2015-09-25: qty 1

## 2015-09-25 MED ORDER — HYDROCHLOROTHIAZIDE 12.5 MG PO CAPS
12.5000 mg | ORAL_CAPSULE | Freq: Every day | ORAL | Status: DC
  Administered 2015-09-25 – 2015-09-26 (×2): 12.5 mg via ORAL
  Filled 2015-09-25 (×2): qty 1

## 2015-09-25 MED ORDER — ALBUTEROL SULFATE (2.5 MG/3ML) 0.083% IN NEBU: 3.0000 mL | INHALATION_SOLUTION | Freq: Four times a day (QID) | RESPIRATORY_TRACT | Status: DC | PRN

## 2015-09-25 MED ORDER — CLOPIDOGREL BISULFATE 75 MG PO TABS
75.0000 mg | ORAL_TABLET | Freq: Every day | ORAL | Status: DC
  Administered 2015-09-26: 75 mg via ORAL
  Filled 2015-09-25: qty 1

## 2015-09-25 MED ORDER — HEPARIN (PORCINE) IN NACL 2-0.9 UNIT/ML-% IJ SOLN
INTRAMUSCULAR | Status: AC
  Filled 2015-09-25: qty 1000

## 2015-09-25 MED ORDER — SODIUM CHLORIDE 0.9 % IV SOLN: 250.0000 mL | INTRAVENOUS | Status: DC | PRN

## 2015-09-25 MED ORDER — ACETAMINOPHEN 500 MG PO TABS
1000.0000 mg | ORAL_TABLET | Freq: Four times a day (QID) | ORAL | Status: DC | PRN
  Administered 2015-09-25 – 2015-09-26 (×2): 1000 mg via ORAL
  Filled 2015-09-25 (×2): qty 2

## 2015-09-25 MED ORDER — FENTANYL CITRATE (PF) 100 MCG/2ML IJ SOLN
INTRAMUSCULAR | Status: DC | PRN
  Administered 2015-09-25 (×3): 25 ug via INTRAVENOUS

## 2015-09-25 MED ORDER — VERAPAMIL HCL 2.5 MG/ML IV SOLN
INTRA_ARTERIAL | Status: DC | PRN
  Administered 2015-09-25: 15 mL via INTRA_ARTERIAL

## 2015-09-25 MED ORDER — FAMOTIDINE IN NACL 20-0.9 MG/50ML-% IV SOLN
INTRAVENOUS | Status: AC
  Administered 2015-09-25: 20 mg via INTRAVENOUS
  Filled 2015-09-25: qty 50

## 2015-09-25 MED ORDER — ONDANSETRON HCL 4 MG/2ML IJ SOLN: 4.0000 mg | Freq: Four times a day (QID) | INTRAMUSCULAR | Status: DC | PRN

## 2015-09-25 MED ORDER — NITROGLYCERIN 0.4 MG SL SUBL
SUBLINGUAL_TABLET | SUBLINGUAL | Status: AC
  Administered 2015-09-25: 0.4 mg via SUBLINGUAL
  Filled 2015-09-25: qty 1

## 2015-09-25 MED ORDER — NITROGLYCERIN 1 MG/10 ML FOR IR/CATH LAB
INTRA_ARTERIAL | Status: DC | PRN
  Administered 2015-09-25: 12:00:00

## 2015-09-25 MED ORDER — ASPIRIN 81 MG PO CHEW
81.0000 mg | CHEWABLE_TABLET | ORAL | Status: AC
  Administered 2015-09-25: 81 mg via ORAL

## 2015-09-25 SURGICAL SUPPLY — 27 items
BALLN ANGIOSCULPT RX 2.5X10 (BALLOONS) ×3
BALLN ANGIOSCULPT RX 3.0X15 (BALLOONS) ×3
BALLN EUPHORA RX 2.0X25 (BALLOONS) ×3
BALLN ~~LOC~~ TREK RX 2.25X15 (BALLOONS) ×2 IMPLANT
BALLN ~~LOC~~ TREK RX 2.75X15 (BALLOONS) ×3
BALLN ~~LOC~~ TREK RX 3.0X12 (BALLOONS) ×3
BALLN ~~LOC~~ TREK RX 3.0X8 (BALLOONS) ×3
BALLOON ANGIOSCULPT RX 2.5X10 (BALLOONS) IMPLANT
BALLOON ANGIOSCULPT RX 3.0X15 (BALLOONS) IMPLANT
BALLOON EUPHORA RX 2.0X25 (BALLOONS) IMPLANT
BALLOON ~~LOC~~ TREK RX 2.75X15 (BALLOONS) IMPLANT
BALLOON ~~LOC~~ TREK RX 3.0X12 (BALLOONS) IMPLANT
BALLOON ~~LOC~~ TREK RX 3.0X8 (BALLOONS) IMPLANT
CATH INFINITI 5FR MULTPACK ANG (CATHETERS) ×2 IMPLANT
CATH VISTA GUIDE 6FR XB3.5 (CATHETERS) ×2 IMPLANT
CUTTING BAL FLEXTOME RX 3.0X6 (BALLOONS) ×2 IMPLANT
DEVICE RAD COMP TR BAND LRG (VASCULAR PRODUCTS) ×3 IMPLANT
GLIDESHEATH SLEND A-KIT 6F 22G (SHEATH) ×3 IMPLANT
KIT ENCORE 26 ADVANTAGE (KITS) ×2 IMPLANT
KIT HEART LEFT (KITS) ×3 IMPLANT
PACK CARDIAC CATHETERIZATION (CUSTOM PROCEDURE TRAY) ×3 IMPLANT
SYR MEDRAD MARK V 150ML (SYRINGE) ×3 IMPLANT
TRANSDUCER W/STOPCOCK (MISCELLANEOUS) ×3 IMPLANT
TUBING CIL FLEX 10 FLL-RA (TUBING) ×3 IMPLANT
WIRE COUGAR XT STRL 190CM (WIRE) ×2 IMPLANT
WIRE HI TORQ BMW 190CM (WIRE) ×2 IMPLANT
WIRE SAFE-T 1.5MM-J .035X260CM (WIRE) ×3 IMPLANT

## 2015-09-25 NOTE — Brief Op Note (Signed)
NAME:  Andrew West   MRN: [id number] DOB:  27-Mar-1966   ADMIT DATE: 09/25/2015  Brief Cardia Catheterization / PTCA Note:  Indication: 1. Crescendo angina 2. Coronary artery disease status post PCI to circumflex, and known RCA occlusion  Procedures: 1. Left Catheterization with Native Coroanry  Angiography via Left Radial Artery access 2. Percutaneous coronary angioplasty alone of mid-distal circumflex 3. Unsuccessful percutaneous coronary angioplasty of 99% in-stent restenosis in OM 2  Medications:  4 mg Versed IV; 75 mcg Fentanyl IV  IC nitroglycerin 200 g 6  IV heparin 6000 sheath insertion Radial Cocktail: 5 mg Verapamil, 400 mcg NTG, 2 ml 2% Lidocaine in 10 ml NS Angiomax bolus and drip  Impression:  Severe 2 vessel native disease with 100% occluded RCA and evidence of left to right collaterals to artery marginal branch, severe in-stent restenosis of the mid circumflex and OM 2 with near occlusion of OM 2 and severe jailed distal AV groove circumflex "ostium". Known 100% ostial occlusion of ramus intermedius stent  Successful PTCA of mid-distal circumflex into the AV groove - with suboptimal resolution of in-stent restenosis in the mid circumflex with 50% residual stenosis.  Unsuccessful PTCA of severe stent restenosis into the continued OM 2  Recommendations:  Monitor overnight status post PTCA with standard radial cath care/TR band removal  Continue aggressive risk factor modification  Consider Ranexa for antianginal effect if inadequate resolution of symptoms  No obvious options to reopen the occluded OM 2  Expected discharge tomorrow. He will follow up with Dr. Rennis Golden  Full note to follow  Marykay Lex, M.D., M.S. Interventional Cardiologist   Pager # [phone]  09/25/2015 12:10 PM

## 2015-09-25 NOTE — Progress Notes (Signed)
CP 7/10 Jennifer informed, standing emergency orders placed.

## 2015-09-25 NOTE — Progress Notes (Signed)
TR BAND REMOVAL  LOCATION:  right radial  DEFLATED PER PROTOCOL:  Yes.    TIME BAND OFF / DRESSING APPLIED:   1700   SITE UPON ARRIVAL:   Level 0  SITE AFTER BAND REMOVAL:  Level 0  CIRCULATION SENSATION AND MOVEMENT:  Within Normal Limits  Yes.    COMMENTS:

## 2015-09-25 NOTE — H&P (View-Only) (Signed)
OFFICE NOTE  Chief Complaint:  Shortness of breath, chest pain, heaviness  Primary Care Physician: Vonna Drafts., FNP  HPI:  Andrew West  is a 49 year old gentleman with a history of coronary artery disease and stent placement to the circumflex and obtuse marginal and a history of in stent restenosis to the LAD. There is also a nondominant right coronary artery that is 100% occluded, filled with collaterals. He did have a stress test recently, which showed an EF of 53% and reversible septal ischemia in the setting of chest pain and after much convincing underwent cardiac catheterization.  He then underwent cutting balloon angioplasty to an ostial lateral OM1 branch and the bifurcation AV groove circumflex OM junction. This was successful at reducing the stenosis to 0%. This was in November 2012 and he did not return for followup appointment until April 2013. He was suffering from low back pain and has been evaluated and treated by Dr. Trenton Gammon with a laminectomy/discectomy, which he says was not helpful. Otherwise, he continues to smoke and occasionally complains of shortness of breath and some chest pain symptoms which are atypical. He is on long-acting and/or has not needed to take short-acting nitroglycerin. His other concern today is that he is having problems with his teeth and was recommended to have edentulation and by an oral surgeon Dr. Diona Browner. Finally, he is suffering from significant stress and depression due to recent separation with wife in dealing with his kids. This seems to be a big tablets on his continued smoking.  Andrew West returns today in the office. He feels fairly well. He denies any chest pain. He continues to have problems with low back pain and numbness and tingling in his legs. He is apparently status post laminectomy and microdiscectomy by Dr. Trenton Gammon and continues to have symptoms which may be related to that. He's also complaining of what sounds  like neuropathic pain. He is not currently on medication. He is asking for Tylenol 3 for pain today which I told him I did not prescribe. Ultimately there are no further surgical or nonsurgical options, he may need to go on a neuropathic pain medication or perhaps be referred to a pain management specialist. He is also looking for a new primary care provider as his primary care provider is close to retirement. He reports he has cut back his smoking to about 1 pack every 2 weeks. He is also been out of amlodipine and simvastatin although his blood pressure is well controlled.  I saw Andrew West in the office today. He is reporting now complains of shortness of breath and chest pain. He also feels like his breathing is worse when working around chemicals at work. He uses a respiratory protection mask but also feels like he is under significant stress. He's had missed several days of work and is concerned about his job. Sounds like he is in a difficult work environment. He says that he is apologizing for "deceiving me" that he was not honest about his symptoms. He apparently has been having shortness of breath and chest pain for several months and did not relay that to me.  Andrew West returns to the office today for follow-up. His main complaint is shortness of breath. He's not feeling as much chest pain he's had been previously. We recently did a nuclear stress test which was negative for ischemia and showed an EF of 56%. With regards to shortness of breath he's concerned most of this was related to chemicals  at work although he has not worked in several months. He's had about 20 pound weight gain since we last saw him in the office and denies any lower extremity swelling, orthopnea or PND. Shortness of breath is persistent and is associated with some wheezing. He did see Dr. 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 Shortness of breath seems to be a little bit worse with exertion which makes me wonder whether there is an element of exercise-induced pulmonary hypertension. He's not had an echocardiogram in some time.  Andrew West returns today for follow-up. He was noted to have some diastolic dysfunction on his echocardiogram. I started him on low-dose Lasix and check lab work including a BNP which is very low around 50. On follow-up today he reports he feels no different with the addition of Lasix. I therefore asked him to take it as needed. He is also describing worsening substernal chest discomfort and a squeezing pressure in his chest today. He says this is been getting worse over the past several days, more than his typical chest discomfort. As previously noted he recently underwent a nuclear stress test a few months ago which was negative for ischemia.  PMHx:  Past Medical History  Diagnosis Date  . Myocardial infarction ([place])     2002; treated with stent to mid L circumflex  . Heart disease   . Hypercholesteremia   . Coronary artery disease   . NSTEMI (non-ST elevated myocardial infarction) ([place]) 10/18/2011  . CAD (coronary artery disease), native coronary artery 04/18/2008  . Diabetes mellitus     Takes Metformin  . Hypertension     Takes Lisinopril   . GERD (gastroesophageal reflux disease)     Takes Dexilant  . Angina     Takes Isosorbide & Nitroglycerin  . Headache(784.0)   . Pneumonia     approx 2 years ago  . Bronchitis     hx of  . Shortness of breath     Occurs while laying down at times  . History of nuclear stress test 07/29/2011    decreased activity in septum on stress images compared with rest; wall motion abnormality, EF 53%  . Rhabdomyolysis     h/o, r/t statins  . H/O cardiac catheterization     with 5 separate dated interventions  . Chronic leg pain     bilateral  . Chronic back pain     Past Surgical History  Procedure  Laterality Date  . Coronary angioplasty with stent placement  10/09/2001    PTCA & stenting of mid AV circumflex; 2.5x14m Pixel stent  . Colonoscopy w/ polypectomy    . Lumbar laminectomy/decompression microdiscectomy  03/31/2012    Procedure: LUMBAR LAMINECTOMY/DECOMPRESSION MICRODISCECTOMY 1 LEVEL;  Surgeon: HCharlie Pitter MD;  [facility];  Service: Neurosurgery;  Laterality: Left;  . Transthoracic echocardiogram  07/28/2011    EF 55-65%; LVH, grade 1 diastolic dysfunction;   . Coronary angioplasty with stent placement  12/13/2001    PCI with stent to mid L circumflex, 95% stenosis to 0% residual  . Coronary angioplasty with stent placement  10/10/2003    PCI to mid AV circumflex; LAD 30% disease; RCA 100% occluded prox.  . Coronary angioplasty with stent placement  09/01/2011    PCI with stenting with bare metal stent to mid AV groove circumflex and PDA  .  Coronary angioplasty with stent placement  10/17/2011    cutting balloon angioplasty of ostial lateral OM1 branch and bifurcation AV groove circumflex OM junction; stenosis reduced to 0%  . Left heart catheterization with coronary angiogram N/A 10/18/2011    Procedure: LEFT HEART CATHETERIZATION WITH CORONARY ANGIOGRAM;  Surgeon: Leonie Man, MD;  Location: [facility] CATH LAB;  Service: Cardiovascular;  Laterality: N/A;    FAMHx:  Family History  Problem Relation Age of Onset  . Anesthesia problems Neg Hx   . Hypotension Neg Hx   . Malignant hyperthermia Neg Hx   . Pseudochol deficiency Neg Hx   . Hypertension Mother   . Heart attack Father   . Diabetes Mother   . Heart disease Brother     x 3   . Heart attack Brother     deceased  . Hypertension Sister   . Diabetes Sister     SOCHx:   reports that he quit smoking about 4 months ago. His smoking use included Cigarettes. He has a 6.25 pack-year smoking history. He has never used smokeless tobacco. He reports that he does not drink alcohol or use illicit  drugs.  ALLERGIES:  Allergies  Allergen Reactions  . Iohexol     PT. TO BE PREMEDICATED PRIOR TO IV CONTRAST PER DR Kris Hartmann /MMS//12/15/15Desc: PT BECAME SOB AND CHEST TIGHTNESS AFTER CONTRAST INJECTION.  [name],RT-RCT., Onset Date: [id number]   . Vicodin [Hydrocodone-Acetaminophen] Itching and Rash    ROS: A comprehensive review of systems was negative except for: Constitutional: positive for fatigue Cardiovascular: positive for chest pressure/discomfort, dyspnea and lower extremity edema Musculoskeletal: positive for arthralgias, back pain and neuropathy  HOME MEDS: Current Outpatient Prescriptions  Medication Sig Dispense Refill  . acetaminophen (TYLENOL) 500 MG tablet Take 1,000 mg by mouth every 6 (six) hours as needed for headache.    . Albuterol Sulfate (PROAIR RESPICLICK) 540 (90 BASE) MCG/ACT AEPB Inhale 1 puff into the lungs every 6 (six) hours as needed (shortness of breath). 1 each 5  . aspirin 325 MG tablet Take 325 mg by mouth daily.    . clopidogrel (PLAVIX) 75 MG tablet Take 1 tablet (75 mg total) by mouth daily. 90 tablet 2  . cyclobenzaprine (FLEXERIL) 10 MG tablet Take 1 tablet (10 mg total) by mouth 3 (three) times daily as needed for muscle spasms. 15 tablet 0  . furosemide (LASIX) 20 MG tablet Take 20 mg by mouth daily as needed for fluid or edema.    . isosorbide mononitrate (IMDUR) 30 MG 24 hr tablet Take 1 tablet (30 mg total) by mouth 2 (two) times daily. 180 tablet 3  . lisinopril (PRINIVIL,ZESTRIL) 20 MG tablet Take 1 tablet (20 mg total) by mouth daily. 90 tablet 3  . metFORMIN (GLUCOPHAGE) 1000 MG tablet Take 1,000 mg by mouth 2 (two) times daily with a meal.     . metoprolol (LOPRESSOR) 50 MG tablet Take 1 tablet (50 mg total) by mouth 2 (two) times daily. 180 tablet 3  . naproxen (NAPROSYN) 500 MG tablet Take 0.5 tablets (250 mg total) by mouth 2 (two) times daily as needed for mild pain or moderate pain (TAKE WITH MEALS.). 10 tablet 0  .  nitroGLYCERIN (NITROSTAT) 0.4 MG SL tablet Place 1 tablet (0.4 mg total) under the tongue every 5 (five) minutes as needed for chest pain. 25 tablet 3  . simvastatin (ZOCOR) 20 MG tablet Take 1 tablet (20 mg total) by mouth at bedtime. 90 tablet 3  .  valsartan-hydrochlorothiazide (DIOVAN-HCT) 320-12.5 MG per tablet Take 1 tablet by mouth daily. 90 tablet 3   No current facility-administered medications for this visit.    LABS/IMAGING: No results found for this or any previous visit (from the past 48 hour(s)). No results found.  VITALS: BP 156/90 mmHg  Pulse 78  Ht '5\' 8"'$  (1.727 m)  Wt 274 lb 4.8 oz (124.422 kg)  BMI 41.72 kg/m2  EXAM: Deferred  EKG: Normal sinus rhythm at 78, nonspecific T wave changes   ASSESSMENT:  1. Chest pain and dyspnea on exertion 2. Coronary artery disease status post PCI and cutting balloon angioplasty over 2 years ago - negative Lexiscan for ischemia 3. Ongoing tobacco abuse 4. Hypertension 5. Dyslipidemia 6. Neuropathy 7. Persistent low back pain 8. Diabetes type 2 9. Morbid obesity  PLAN: 1.   Andrew West continues to have shortness of breath and chest pain. There was no benefit from addition of diuretics. He is requesting a cardiac catheterization. There is low likelihood of a false-negative stress test however this could be possible and definitive cardiac catheterization may be helpful to evaluate for obstructive coronary disease. I'll ask my partner Dr. Ellyn Hack who did his original catheterization to recath him later this week based on his availability. He will need contrast dye prophylaxis given a history of dye allergy in the past. Plan to follow-up with him after his catheterization.  Pixie Casino, MD, [facility] Attending Cardiologist [place] 09/23/2015, 5:16 PM

## 2015-09-25 NOTE — Interval H&P Note (Signed)
History and Physical Interval Note:  09/25/2015 7:20 AM  Andrew West  has presented today for surgery, with the diagnosis of Crescendo Angina. Principal Problem:   Crescendo angina [facility]) Active Problems:   CAD S/P percutaneous coronary angioplasty   Exertional dyspnea    The various methods of treatment have been discussed with the patient and family. After consideration of risks, benefits and other options for treatment, the patient has consented to  Procedure(s): Left Heart Cath and Coronary Angiography (N/A) with possible Percutaneous Coronary Intervention  as a surgical intervention .  The patient's history has been reviewed, patient examined, no change in status, stable for surgery.  I have reviewed the patient's chart and labs.  Questions were answered to the patient's satisfaction.     [place]  Cath Lab Visit (complete for each Cath Lab visit)  Clinical Evaluation Leading to the Procedure:   ACS: No.  Non-ACS:    Anginal Classification: CCS III  Anti-ischemic medical therapy: Maximal Therapy (2 or more classes of medications)  Non-Invasive Test Results: Low-risk stress test findings: cardiac mortality <1%/year  Prior CABG: No previous CABG   AUC Ischemic Symptoms? CCS III (Marked limitation of ordinary activity) Anti-ischemic Medical Therapy? Maximal Medical Therapy (2 or more classes of medications) Non-invasive Test Results? Low-risk stress test findings: cardiac mortality <1%/year Prior CABG? No Previous CABG   Patient Information:   1-2V CAD, no prox LAD  A (7)  Indication: 15; Score: 7   Patient Information:   CTO of 1 vessel, no other CAD  U (6)  Indication: 25; Score: 6   Patient Information:   1V CAD with prox LAD  A (8)  Indication: 31; Score: 8   Patient Information:   2V-CAD with prox LAD  A (8)  Indication: 37; Score: 8   Patient Information:   3V-CAD without LMCA  A (8)  Indication: 43; Score: 8   Patient  Information:   3V-CAD without LMCA With Abnormal LV systolic function  A (9)  Indication: 48; Score: 9   Patient Information:   LMCA-CAD  A (9)  Indication: 49; Score: 9   Patient Information:   2V-CAD with prox LAD PCI  A (7)  Indication: 62; Score: 7   Patient Information:   2V-CAD with prox LAD CABG  A (8)  Indication: 62; Score: 8   Patient Information:   3V-CAD without LMCA With Low CAD burden(i.e., 3 focal stenoses, low SYNTAX score) PCI  A (7)  Indication: 63; Score: 7   Patient Information:   3V-CAD without LMCA With Low CAD burden(i.e., 3 focal stenoses, low SYNTAX score) CABG  A (9)  Indication: 63; Score: 9   Patient Information:   3V-CAD without LMCA E06c - Intermediate-high CAD burden (i.e., multiple diffuse lesions, presence of CTO, or high SYNTAX score) PCI  U (4)  Indication: 64; Score: 4   Patient Information:   3V-CAD without LMCA E06c - Intermediate-high CAD burden (i.e., multiple diffuse lesions, presence of CTO, or high SYNTAX score) CABG  A (9)  Indication: 64; Score: 9   Patient Information:   LMCA-CAD With Isolated LMCA stenosis  PCI  U (6)  Indication: 65; Score: 6   Patient Information:   LMCA-CAD With Isolated LMCA stenosis  CABG  A (9)  Indication: 65; Score: 9   Patient Information:   LMCA-CAD Additional CAD, low CAD burden (i.e., 1- to 2-vessel additional involvement, low SYNTAX score) PCI  U (5)  Indication: 66; Score: 5  Patient Information:   LMCA-CAD Additional CAD, low CAD burden (i.e., 1- to 2-vessel additional involvement, low SYNTAX score) CABG  A (9)  Indication: 66; Score: 9   Patient Information:   LMCA-CAD Additional CAD, intermediate-high CAD burden (i.e., 3-vessel involvement, presence of CTO, or high SYNTAX score) PCI  I (3)  Indication: 67; Score: 3   Patient Information:   LMCA-CAD Additional CAD, intermediate-high CAD burden (i.e., 3-vessel involvement,  presence of CTO, or high SYNTAX score) CABG  A (9)  Indication: 67; Score: 9  Esmee Fallaw W, M.D., M.S. Interventional Cardiologist   Pager # [phone]

## 2015-09-26 ENCOUNTER — Encounter (HOSPITAL_COMMUNITY): Payer: Self-pay | Admitting: Cardiology

## 2015-09-26 ENCOUNTER — Telehealth: Payer: Self-pay | Admitting: Internal Medicine

## 2015-09-26 ENCOUNTER — Ambulatory Visit: Payer: Self-pay | Admitting: Internal Medicine

## 2015-09-26 DIAGNOSIS — I2582 Chronic total occlusion of coronary artery: Secondary | ICD-10-CM | POA: Diagnosis not present

## 2015-09-26 DIAGNOSIS — Z9861 Coronary angioplasty status: Secondary | ICD-10-CM

## 2015-09-26 DIAGNOSIS — I200 Unstable angina: Secondary | ICD-10-CM | POA: Diagnosis not present

## 2015-09-26 DIAGNOSIS — I25118 Atherosclerotic heart disease of native coronary artery with other forms of angina pectoris: Secondary | ICD-10-CM | POA: Diagnosis not present

## 2015-09-26 DIAGNOSIS — Z955 Presence of coronary angioplasty implant and graft: Secondary | ICD-10-CM | POA: Diagnosis not present

## 2015-09-26 DIAGNOSIS — I2510 Atherosclerotic heart disease of native coronary artery without angina pectoris: Secondary | ICD-10-CM | POA: Diagnosis not present

## 2015-09-26 DIAGNOSIS — T82855A Stenosis of coronary artery stent, initial encounter: Secondary | ICD-10-CM | POA: Diagnosis not present

## 2015-09-26 LAB — BASIC METABOLIC PANEL
ANION GAP: 11 (ref 5–15)
BUN: 9 mg/dL (ref 6–20)
CHLORIDE: 102 mmol/L (ref 101–111)
CO2: 26 mmol/L (ref 22–32)
Calcium: 9 mg/dL (ref 8.9–10.3)
Creatinine, Ser: 0.9 mg/dL (ref 0.61–1.24)
GFR calc Af Amer: >60 mL/min (ref >60)
GLUCOSE: 162 mg/dL — AB (ref 65–99)
POTASSIUM: 4.1 mmol/L (ref 3.5–5.1)
Sodium: 139 mmol/L (ref 135–145)

## 2015-09-26 LAB — GLUCOSE, CAPILLARY
GLUCOSE-CAPILLARY: 154 mg/dL — AB (ref 65–99)
GLUCOSE-CAPILLARY: 180 mg/dL — AB (ref 65–99)
GLUCOSE-CAPILLARY: 213 mg/dL — AB (ref 65–99)

## 2015-09-26 LAB — CBC
HEMATOCRIT: 41.6 % (ref 39.0–52.0)
HEMOGLOBIN: 13.4 g/dL (ref 13.0–17.0)
MCH: 25.9 pg — ABNORMAL LOW (ref 26.0–34.0)
MCHC: 32.2 g/dL (ref 30.0–36.0)
MCV: 80.5 fL (ref 78.0–100.0)
PLATELETS: 241 10*3/uL (ref 150–400)
RBC: 5.17 MIL/uL (ref 4.22–5.81)
RDW: 13.2 % (ref 11.5–15.5)
WBC: 12.8 10*3/uL — AB (ref 4.0–10.5)

## 2015-09-26 MED ORDER — VALSARTAN-HYDROCHLOROTHIAZIDE 320-12.5 MG PO TABS: 1.0000 | ORAL_TABLET | Freq: Every day | ORAL | Status: DC

## 2015-09-26 MED ORDER — ISOSORBIDE MONONITRATE ER 30 MG PO TB24: 30.0000 mg | ORAL_TABLET | Freq: Two times a day (BID) | ORAL | Status: DC

## 2015-09-26 MED ORDER — SIMVASTATIN 20 MG PO TABS: 20.0000 mg | ORAL_TABLET | Freq: Every day | ORAL | Status: DC

## 2015-09-26 MED ORDER — METFORMIN HCL 1000 MG PO TABS: 1000.0000 mg | ORAL_TABLET | Freq: Two times a day (BID) | ORAL | Status: DC

## 2015-09-26 MED ORDER — METOPROLOL TARTRATE 50 MG PO TABS: 50.0000 mg | ORAL_TABLET | Freq: Two times a day (BID) | ORAL | Status: DC

## 2015-09-26 MED ORDER — CLOPIDOGREL BISULFATE 75 MG PO TABS: 75.0000 mg | ORAL_TABLET | Freq: Every day | ORAL | Status: DC

## 2015-09-26 MED ORDER — NITROGLYCERIN 0.4 MG SL SUBL: 0.4000 mg | SUBLINGUAL_TABLET | SUBLINGUAL | Status: DC | PRN

## 2015-09-26 MED ORDER — ALUM & MAG HYDROXIDE-SIMETH 200-200-20 MG/5ML PO SUSP
30.0000 mL | ORAL | Status: DC | PRN
  Administered 2015-09-26: 30 mL via ORAL
  Filled 2015-09-26: qty 30

## 2015-09-26 MED ORDER — ASPIRIN 81 MG PO TABS: 325.0000 mg | ORAL_TABLET | Freq: Every day | ORAL | Status: DC

## 2015-09-26 NOTE — Telephone Encounter (Signed)
Number goes to invalid/disconnected line.  I have samples ready for patient. Per chart review he is being discharged today. Will route to Kathlene November to see if she can inform pt before d/c home.

## 2015-09-26 NOTE — Progress Notes (Addendum)
CARDIAC REHAB PHASE I   PRE:  Rate/Rhythm: 77 SR  BP:  Sitting: 144/96        SaO2: 99 RA  MODE:  Ambulation: 1000 ft   POST:  Rate/Rhythm: 91 SR  BP:  Sitting: 158/104         SaO2: 98 RA  Pt in bed, c/o back pain. Pt ambulated 1000 ft on RA, independent, slow, steady gait, tolerated well.  Pt denies cp, dizziness, DOE, declined rest stop. Pt BP elevated before and after ambulation. RN aware. Completed PCI education with pt family at bedside.  Reviewed risk factors, tobacco cessation, anti-platelet therapy, activity restrictions, ntg, exercise, heart healthy diet, carb counting, portion control, daily weights, sodium restrictions, s/s heart failure (pt recently started on lasix for new diastolic dysfunction per note), and phase 2 cardiac rehab. Pt verbalized understanding. Pt agrees to phase 2 cardiac rehab. Will send referral to [facility]. Pt states he knows he needs to stop smoking, eat less fried food and stop drinking regular sodas. Pt also states he is aware of the importance of follow-up and medication compliance. Pt states he has not been taking his cholesterol medication because he could not afford it. RN notified, states pt is being followed by case manager to address medication cost issues. Pt requesting pain medication for back pain, RN aware. Pt in bed, call bell within reach.     [phone]  Lenna Sciara, RN, BSN 09/26/2015 9:01 AM

## 2015-09-26 NOTE — Telephone Encounter (Signed)
Encounter closed

## 2015-09-26 NOTE — Discharge Instructions (Addendum)
Metformin will be resumed 48 hours post cardiac cath. You can safely restart medication on 09/26/15 in the PM.  Radial Site Care Refer to this sheet in the next few weeks. These instructions provide you with information on caring for yourself after your procedure. Your caregiver may also give you more specific instructions. Your treatment has been planned according to current medical practices, but problems sometimes occur. Call your caregiver if you have any problems or questions after your procedure. HOME CARE INSTRUCTIONS  You may shower the day after the procedure.Remove the bandage (dressing) and gently wash the site with plain soap and water.Gently pat the site dry.   Do not apply powder or lotion to the site.   Do not submerge the affected site in water for 3 to 5 days.   Inspect the site at least twice daily.   Do not flex or bend the affected arm for 24 hours.   No lifting over 5 pounds (2.3 kg) for 5 days after your procedure.   Do not drive home if you are discharged the same day of the procedure. Have someone else drive you.   You may drive 24 hours after the procedure unless otherwise instructed by your caregiver.  What to expect:  Any bruising will usually fade within 1 to 2 weeks.   Blood that collects in the tissue (hematoma) may be painful to the touch. It should usually decrease in size and tenderness within 1 to 2 weeks.  SEEK IMMEDIATE MEDICAL CARE IF:  You have unusual pain at the radial site.   You have redness, warmth, swelling, or pain at the radial site.   You have drainage (other than a small amount of blood on the dressing).   You have chills.   You have a fever or persistent symptoms for more than 72 hours.   You have a fever and your symptoms suddenly get worse.   Your arm becomes pale, cool, tingly, or numb.   You have heavy bleeding from the site. Hold pressure on the site.    MEDICATION TO STOP TAKING:  STOP TAKING LISINOPRIL

## 2015-09-26 NOTE — Telephone Encounter (Signed)
Andrew West called in stating that Dr.C wants the pt to get sample of Renexa until his appt with Dr. Debara Pickett. His instructions for this medication is take '500mg'$  BID for 1 wk and then '1000mg'$  once a day from 11/12 to 11/17. Contact pt once those samples are ready.  Thanks

## 2015-09-26 NOTE — Progress Notes (Signed)
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 He feels ready to go home  CURRENT MEDS . aspirin  81 mg Oral Daily  . clopidogrel  75 mg Oral Daily  . irbesartan  300 mg Oral Daily   And  . hydrochlorothiazide  12.5 mg Oral Daily  . insulin aspart  0-15 Units Subcutaneous TID WC  . isosorbide mononitrate  30 mg Oral BID  . lisinopril  20 mg Oral Daily  . metoprolol  50 mg Oral BID  . simvastatin  20 mg Oral QHS    OBJECTIVE  Filed Vitals:   09/25/15 1730 09/25/15 2006 09/26/15 0401 09/26/15 0751  BP:  152/89 136/85 144/96  Pulse: 86 78 87 77  Temp:  98.1 F (36.7 C) 97 F (36.1 C) 97.7 F (36.5 C)  TempSrc:  Oral Oral Oral  Resp: '23 18 19 20  '$ Height:      Weight:   273 lb 2.4 oz (123.9 kg)   SpO2: 98% 98% 97% 95%    Intake/Output Summary (Last 24 hours) at 09/26/15 0959 Last data filed at 09/25/15 2012  Gross per 24 hour  Intake 2254.8 ml  Output   1600 ml  Net  654.8 ml   Filed Weights   09/25/15 0706 09/26/15 0401  Weight: 260 lb (117.935 kg) 273 lb 2.4 oz (123.9 kg)    PHYSICAL EXAM  General: Pleasant, NAD.  obese Neuro: Alert and oriented X 3. Moves all extremities spontaneously. Psych: Normal affect. HEENT:  Normal  Neck: Supple without bruits or JVD. Lungs:  Resp regular and unlabored, CTA. Heart: RRR no s3, s4, or murmurs. Abdomen: Soft, non-tender, non-distended, BS + x 4.  Extremities: No clubbing, cyanosis or edema. DP/PT/Radials 2+ and equal bilaterally.  Accessory Clinical Findings  CBC  Recent Labs  09/23/15 1001 09/26/15 0536  WBC 6.0 12.8*  HGB 15.0 13.4  HCT 44.3 41.6  MCV 79.4 80.5  PLT 265 517   Basic Metabolic Panel  Recent Labs  09/23/15 1001 09/26/15 0536  NA 138 139  K 4.1 4.1  CL 105 102  CO2 26  26  GLUCOSE 165* 162*  BUN 10 9  CREATININE 0.82 0.90  CALCIUM 8.7 9.0   Thyroid Function Tests  Recent Labs  09/23/15 1001  TSH 0.871    TELE  NSr with some PVCs  Radiology/Studies  Procedures    Left Heart Cath and Coronary Angiography    Conclusion     Ost 2nd Mrg to 2nd Mrg lesion, 99% stenosed. Post intervention, 99% residual stenosis remained. The lesion was previously treated with a bare metal stentgreater than two years ago.  Mid Cx-2 lesion, 90% stenosed. Post intervention, there is a 20% residual stenosis.  Mid Cx-1 lesion, 80% stenosed. Post intervention, there is a 50% residual stenosis. The lesion was previously treated with a bare metal stent.  Ramus lesion, 100% stenosed. The lesion was previously treated with a bare metal stentgreater than two years ago.  Prox RCA lesion, 100% stenosed.  There is mild left ventricular systolic dysfunction.  Moderately elevated LVEDP of 20 mmHg  Difficult situation with what amounts to be a totally occluded (In-stent re-stenosis) of the OM2 branch with sub-optimal PTCA of the mid AV-Groove In-stent restenosis. Recommendations:  Monitor  overnight status post PTCA with standard radial cath care/TR band removal  Continue aggressive risk factor modification  Consider Ranexa for antianginal effect if inadequate resolution of symptoms  No obvious options to reopen the occluded OM 2 -- could consider Rotational Atherectomy for De-bulking of Neo-intimal hyperplasia.  Expected discharge tomorrow. He will follow up with Dr. Debara Pickett     ASSESSMENT AND PLAN  Andrew West is a 49 y.o. male with a history of CAD s/p PCI/stent to LCx and OM1; cutting balloon angioplasty to OM 1, HLD, HTN,  continuous tobacco abuse, chronic back pain s/p laminectomy/discectomy, depression who presented to [facility] on 09/25/15 for planned cardiac cath for crescendo angina.   Crescendo angina- he was seen in the office by Dr. Debara Pickett on 09/23/15 for  chest pain and SOB. He underwent LHC on 09/25/15 with totally occluded (In-stent re-stenosis) of the OM2 branch with sub-optimal PTCA of the mid AV-Groove In-stent restenosis. No obvious options to reopen the occluded OM 2 -- could consider Rotational Atherectomy for De-bulking of Neo-intimal hyperplasia. -- Dr Ellyn Hack recommended aggressive RF modification. We will start Ranexa '500mg'$  BID. He can obtain sample from the office today and we will se how he does with this in 1 week at post hospital follow up -- He has been taking ASA '325mg'$ . We will decrease this to 81 mg. Continue plavix, metoprolol '50mg'$  BID, simvastatin '20mg'$  and imdur '20mg'$ .   HTN - stable. Continue current regimen  HLD- continue statin  DM- continue metformin, this will be resumed 48 hours post cardiac cath.     Mable Fill R PA-C  Pager [phone]  I have seen and examined the patient along with Angelena Form R PA-C.  I have reviewed the chart, notes and new data.  I agree with PA's note.  Key new complaints: mild residual chest discomfort Key examination changes: no complications at cath site, no overt HF Key new findings / data: cath findings reviewed with patient  PLAN: Try Ranexa samples (which he will pick up from our [place] office). If it works, will need to apply for patient assistance from the manufacturer. Reinforced need for perfect compliance with dual antiplatelet therapy, permanent smoking cessation (he reports that he has quit), improved glycemic control and statin long term. The risk of restenosis is high and he may ultimately need CABG, but would like to defer that since he is so young and has essentially single vessel disease.  Sanda Klein, MD, [place] [phone] 09/26/2015, 10:39 AM

## 2015-09-26 NOTE — Telephone Encounter (Signed)
He is in the hospital now being discharged. He knows to go pick up samples. Thanks for getting it ready for him!

## 2015-09-26 NOTE — Discharge Summary (Signed)
Discharge Summary   Patient ID: Andrew West MRN: [id number], DOB/AGE: 1966-05-16 49 y.o. Admit date: 09/25/2015 D/C date:     09/26/2015  Primary Cardiologist: Dr. 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  HPI: Andrew West is a 49 y.o. male with a history of CAD s/p PCI/stent to LCx and OM1; cutting balloon angioplasty to OM 1, HLD, HTN, continuous tobacco abuse, chronic back pain s/p laminectomy/discectomy, depression who presented to [facility] on 09/25/15 for planned cardiac cath for crescendo angina.    Hospital Course  Crescendo angina- he was seen in the office by Dr. Rennis Golden on 09/23/15 for chest pain and SOB. He underwent LHC on 09/25/15 with totally occluded (In-stent re-stenosis) of the OM2 branch with sub-optimal PTCA of the mid AV-Groove In-stent restenosis. No obvious options to reopen the occluded OM 2 -- could consider Rotational Atherectomy for De-bulking of Neo-intimal hyperplasia per Dr Elissa Hefty cath note -- Dr Herbie Baltimore recommended aggressive RF modification. He still has residual chest discomfort/dyspnea. We will start Ranexa 500mg  BIDx 1 week with 1000mg  BID until follow up. He can obtain samples from the office today and we will se how he does with this at his follow up in 2 weeks.  -- He has been taking ASA 325mg . We will decrease this to 81 mg. Continue plavix, metoprolol 50mg  BID, simvastatin 20mg  and imdur 20mg .  -- Reinforced need for perfect compliance with dual antiplatelet therapy, permanent smoking cessation (he reports that he has quit), improved glycemic control and statin long term. The risk of restenosis is high and he may ultimately need CABG,  but would like to defer that since he is so young and has essentially single vessel disease.  HTN - stable. Continue current regimen with Diovan-HCt and lopressor 50mg  BID  HLD- continue statin  DM- continue metformin, this will be resumed 48 hours post cardiac cath. He has not been taking this since he ran out. I have refilled this. He knows to restart 09/26/15 PM.   The patient has had an uncomplicated hospital course and is recovering well. The radial catheter site is stable. He has been seen by Dr. Royann Shivers today and deemed ready for discharge home. All follow-up appointments have been scheduled. Smoking cessation was disscussed in length. Discharge medications are listed below. All of his medicines have [facility] as he cannot afford even the medications on the 4$ list.    Discharge Vitals: Blood pressure 144/96, pulse 77, temperature 97.7 F (36.5 C), temperature source Oral, resp. rate 20, height 6\' 8"  (2.032 m), weight 273 lb 2.4 oz (123.9 kg), SpO2 95 %.  Labs: Lab Results  Component Value Date   WBC 12.8* 09/26/2015   HGB 13.4 09/26/2015   HCT 41.6 09/26/2015   MCV 80.5 09/26/2015   PLT 241 09/26/2015     Recent Labs Lab 09/26/15 0536  NA 139  K 4.1  CL 102  CO2 26  BUN 9  CREATININE 0.90  CALCIUM 9.0  GLUCOSE 162*     Diagnostic Studies/Procedures    09/25/15 Procedures    Left Heart Cath and Coronary Angiography  Conclusion     Ost 2nd Mrg to 2nd Mrg lesion, 99% stenosed. Post intervention, 99% residual stenosis remained. The lesion was previously treated with a bare metal stentgreater than two years ago.  Mid Cx-2 lesion, 90% stenosed. Post intervention, there is a 20% residual stenosis.  Mid Cx-1 lesion, 80% stenosed. Post intervention, there is a 50% residual stenosis. The lesion was previously treated with a bare metal stent.  Ramus lesion, 100% stenosed. The lesion was previously  treated with a bare metal stentgreater than two years ago.  Prox RCA lesion, 100% stenosed.  There is mild left ventricular systolic dysfunction.  Moderately elevated LVEDP of 20 mmHg  Difficult situation with what amounts to be a totally occluded (In-stent re-stenosis) of the OM2 branch with sub-optimal PTCA of the mid AV-Groove In-stent restenosis. Recommendations:  Monitor overnight status post PTCA with standard radial cath care/TR band removal  Continue aggressive risk factor modification  Consider Ranexa for antianginal effect if inadequate resolution of symptoms  No obvious options to reopen the occluded OM 2 -- could consider Rotational Atherectomy for De-bulking of Neo-intimal hyperplasia.  Expected discharge tomorrow. He will follow up with Dr. Rennis Golden         Discharge Medications     Medication List    STOP taking these medications        lisinopril 20 MG tablet  Commonly known as:  PRINIVIL,ZESTRIL      TAKE these medications        acetaminophen 500 MG tablet  Commonly known as:  TYLENOL  Take 1,000 mg by mouth every 6 (six) hours as needed for headache.     Albuterol Sulfate 108 (90 BASE) MCG/ACT Aepb  Commonly known as:  PROAIR RESPICLICK  Inhale 1 puff into the lungs every 6 (six) hours as needed (shortness of breath).     aspirin 81 MG tablet  Take 4 tablets (325 mg total) by mouth daily.     clopidogrel 75 MG tablet  Commonly known as:  PLAVIX  Take 1 tablet (75 mg total) by mouth daily.     furosemide 20 MG tablet  Commonly known as:  LASIX  Take 20 mg by mouth daily as needed for fluid or edema.     isosorbide mononitrate 30 MG 24 hr tablet  Commonly known as:  IMDUR  Take 1 tablet (30 mg total) by mouth 2 (two) times daily.     metFORMIN 1000 MG tablet  Commonly known as:  GLUCOPHAGE  Take 1 tablet (1,000 mg total) by mouth 2 (two) times daily with a meal.  Start taking on:  09/27/2015     metoprolol 50 MG tablet  Commonly known  as:  LOPRESSOR  Take 1 tablet (50 mg total) by mouth 2 (two) times daily.     nitroGLYCERIN 0.4 MG SL tablet  Commonly known as:  NITROSTAT  Place 1 tablet (0.4 mg total) under the tongue every 5 (five) minutes as needed for chest pain.     simvastatin 20 MG tablet  Commonly known as:  ZOCOR  Take 1 tablet (20 mg total) by mouth at bedtime.     valsartan-hydrochlorothiazide 320-12.5 MG tablet  Commonly known as:  DIOVAN-HCT  Take 1 tablet by mouth daily.        Disposition   The patient will be discharged in stable condition to home. Discharge Instructions    Amb Referral to Cardiac Rehabilitation    Complete by:  As directed   Diagnosis:  PCI  Follow-up Information    Follow up with Chrystie Nose, MD On 10/09/2015.   Specialty:  Cardiology   Why:  @ 11:30am   Contact information:   [address] [place] [address] [place] [address] [phone]       Follow up with [facility] On 10/01/2015.   Why:    To establish PCP and f/u hospital visit appointment arranged for 10/01/15 at 3:45pm   Contact information:   [address] [place] [address] [phone]        Duration of Discharge Encounter: Greater than 30 minutes including physician and PA time.  Signed, Janee Morn, Claud Gowan R PA-C 09/26/2015, 11:13 AM

## 2015-09-27 ENCOUNTER — Telehealth: Payer: Self-pay | Admitting: Physician Assistant

## 2015-09-27 NOTE — Progress Notes (Signed)
CM received consult regarding pt unable to afford cost of medication, however pt does have prescription coverage but states recently lost job and doesn't have money to pay copay cost. CM provided and shared information about  the [facility] and pt stated he's interested in program. CM called [place] and scheduled a f/u hospital visit appointment and faxed to prescriptions to [facility] pharmacy to help pt obtain medications with limited funds. Information shared with pt per CM and pt was very appreciative.  Whitman Hero RN,BSN,CM [phone]

## 2015-09-27 NOTE — Telephone Encounter (Signed)
Patient called answering service on Saturday reporting increasing wrist pain, swelling at radial cath site. He had pain at his cath site yesterday but shrugged it off, thinking it was just from his procedure. When he woke up this morning the site looked much more swollen. It is tender to the touch and hurts to move his wrist. Tylenol is not helping. I think someone needs to look at this in person to exclude hematoma and evaluate his vasculature since things are getting worse - I advised he proceed to ER. He knows not to drive himself. He said he will get a family member to drive him. He says if the family member cannot get there in a timely manner he plans to call an ambulance instead.  Dayna Dunn PA-C

## 2015-09-29 ENCOUNTER — Telehealth: Payer: Self-pay | Admitting: Internal Medicine

## 2015-09-29 NOTE — Telephone Encounter (Signed)
Attempted to contact patient at number listed as the one that he called in from. Message said number was invalid.

## 2015-09-29 NOTE — Telephone Encounter (Signed)
Pt called in stating that his job is needing his short term disability forms filled out and faxed to the job as soon as possible. Please f/u with pt  Thanks

## 2015-09-29 NOTE — Telephone Encounter (Signed)
Attempted to contact patient - phone has been "changed, disconnected, or no longer in service"

## 2015-09-29 NOTE — Telephone Encounter (Signed)
Thanks Dayna -  Dr. Lemmie Evens

## 2015-10-01 ENCOUNTER — Ambulatory Visit: Payer: BC Managed Care – PPO | Attending: Family Medicine | Admitting: Family Medicine

## 2015-10-01 ENCOUNTER — Encounter: Payer: Self-pay | Admitting: Family Medicine

## 2015-10-01 ENCOUNTER — Encounter (HOSPITAL_BASED_OUTPATIENT_CLINIC_OR_DEPARTMENT_OTHER): Payer: BC Managed Care – PPO | Admitting: Clinical

## 2015-10-01 VITALS — BP 152/100 | HR 90 | Temp 98.6°F | Resp 18 | Ht 68.0 in | Wt 272.0 lb

## 2015-10-01 DIAGNOSIS — E1169 Type 2 diabetes mellitus with other specified complication: Secondary | ICD-10-CM | POA: Insufficient documentation

## 2015-10-01 DIAGNOSIS — K0889 Other specified disorders of teeth and supporting structures: Secondary | ICD-10-CM | POA: Diagnosis not present

## 2015-10-01 DIAGNOSIS — I25110 Atherosclerotic heart disease of native coronary artery with unstable angina pectoris: Secondary | ICD-10-CM | POA: Diagnosis not present

## 2015-10-01 DIAGNOSIS — I10 Essential (primary) hypertension: Secondary | ICD-10-CM | POA: Insufficient documentation

## 2015-10-01 DIAGNOSIS — M5116 Intervertebral disc disorders with radiculopathy, lumbar region: Secondary | ICD-10-CM | POA: Diagnosis not present

## 2015-10-01 DIAGNOSIS — F4323 Adjustment disorder with mixed anxiety and depressed mood: Secondary | ICD-10-CM

## 2015-10-01 LAB — GLUCOSE, POCT (MANUAL RESULT ENTRY): POC Glucose: 120 mg/dl — AB (ref 70–99)

## 2015-10-01 LAB — POCT GLYCOSYLATED HEMOGLOBIN (HGB A1C): Hemoglobin A1C: 6.7

## 2015-10-01 MED ORDER — SIMVASTATIN 20 MG PO TABS: 20.0000 mg | ORAL_TABLET | Freq: Every day | ORAL | Status: DC

## 2015-10-01 MED ORDER — RANOLAZINE ER 500 MG PO TB12: 500.0000 mg | ORAL_TABLET | Freq: Two times a day (BID) | ORAL | Status: DC

## 2015-10-01 MED ORDER — MOMETASONE FUROATE 220 MCG/INH IN AEPB: 2.0000 | INHALATION_SPRAY | Freq: Every day | RESPIRATORY_TRACT | Status: DC

## 2015-10-01 MED ORDER — ISOSORBIDE MONONITRATE ER 30 MG PO TB24: 30.0000 mg | ORAL_TABLET | Freq: Two times a day (BID) | ORAL | Status: DC

## 2015-10-01 MED ORDER — VALSARTAN-HYDROCHLOROTHIAZIDE 320-25 MG PO TABS: 1.0000 | ORAL_TABLET | Freq: Every day | ORAL | Status: DC

## 2015-10-01 MED ORDER — TRAMADOL HCL 50 MG PO TABS: 50.0000 mg | ORAL_TABLET | Freq: Three times a day (TID) | ORAL | Status: DC | PRN

## 2015-10-01 MED ORDER — METFORMIN HCL 1000 MG PO TABS: 1000.0000 mg | ORAL_TABLET | Freq: Two times a day (BID) | ORAL | Status: DC

## 2015-10-01 MED ORDER — FUROSEMIDE 20 MG PO TABS: 20.0000 mg | ORAL_TABLET | Freq: Every day | ORAL | Status: DC | PRN

## 2015-10-01 MED ORDER — METOPROLOL TARTRATE 100 MG PO TABS: 50.0000 mg | ORAL_TABLET | Freq: Two times a day (BID) | ORAL | Status: DC

## 2015-10-01 MED ORDER — ALBUTEROL SULFATE 108 (90 BASE) MCG/ACT IN AEPB: 1.0000 | INHALATION_SPRAY | Freq: Four times a day (QID) | RESPIRATORY_TRACT | Status: DC | PRN

## 2015-10-01 MED ORDER — ASPIRIN 325 MG PO TABS: 325.0000 mg | ORAL_TABLET | Freq: Every day | ORAL | Status: DC

## 2015-10-01 MED ORDER — CLOPIDOGREL BISULFATE 75 MG PO TABS: 75.0000 mg | ORAL_TABLET | Freq: Every day | ORAL | Status: DC

## 2015-10-01 NOTE — Patient Instructions (Signed)
Diabetes Mellitus and Food It is important for you to manage your blood sugar (glucose) level. Your blood glucose level can be greatly affected by what you eat. Eating healthier foods in the appropriate amounts throughout the day at about the same time each day will help you control your blood glucose level. It can also help slow or prevent worsening of your diabetes mellitus. Healthy eating may even help you improve the level of your blood pressure and reach or maintain a healthy weight.  General recommendations for healthful eating and cooking habits include:  Eating meals and snacks regularly. Avoid going long periods of time without eating to lose weight.  Eating a diet that consists mainly of plant-based foods, such as fruits, vegetables, nuts, legumes, and whole grains.  Using low-heat cooking methods, such as baking, instead of high-heat cooking methods, such as deep frying. Work with your dietitian to make sure you understand how to use the Nutrition Facts information on food labels. HOW CAN FOOD AFFECT ME? Carbohydrates Carbohydrates affect your blood glucose level more than any other type of food. Your dietitian will help you determine how many carbohydrates to eat at each meal and teach you how to count carbohydrates. Counting carbohydrates is important to keep your blood glucose at a healthy level, especially if you are using insulin or taking certain medicines for diabetes mellitus. Alcohol Alcohol can cause sudden decreases in blood glucose (hypoglycemia), especially if you use insulin or take certain medicines for diabetes mellitus. Hypoglycemia can be a life-threatening condition. Symptoms of hypoglycemia (sleepiness, dizziness, and disorientation) are similar to symptoms of having too much alcohol.  If your health care provider has given you approval to drink alcohol, do so in moderation and use the following guidelines:  Women should not have more than one drink per day, and men  should not have more than two drinks per day. One drink is equal to:  12 oz of beer.  5 oz of wine.  1 oz of hard liquor.  Do not drink on an empty stomach.  Keep yourself hydrated. Have water, diet soda, or unsweetened iced tea.  Regular soda, juice, and other mixers might contain a lot of carbohydrates and should be counted. WHAT FOODS ARE NOT RECOMMENDED? As you make food choices, it is important to remember that all foods are not the same. Some foods have fewer nutrients per serving than other foods, even though they might have the same number of calories or carbohydrates. It is difficult to get your body what it needs when you eat foods with fewer nutrients. Examples of foods that you should avoid that are high in calories and carbohydrates but low in nutrients include:  Trans fats (most processed foods list trans fats on the Nutrition Facts label).  Regular soda.  Juice.  Candy.  Sweets, such as cake, pie, doughnuts, and cookies.  Fried foods. WHAT FOODS CAN I EAT? Eat nutrient-rich foods, which will nourish your body and keep you healthy. The food you should eat also will depend on several factors, including:  The calories you need.  The medicines you take.  Your weight.  Your blood glucose level.  Your blood pressure level.  Your cholesterol level. You should eat a variety of foods, including:  Protein.  Lean cuts of meat.  Proteins low in saturated fats, such as fish, egg whites, and beans. Avoid processed meats.  Fruits and vegetables.  Fruits and vegetables that may help control blood glucose levels, such as apples, mangoes, and   yams.  Dairy products.  Choose fat-free or low-fat dairy products, such as milk, yogurt, and cheese.  Grains, bread, pasta, and rice.  Choose whole grain products, such as multigrain bread, whole oats, and brown rice. These foods may help control blood pressure.  Fats.  Foods containing healthful fats, such as nuts,  avocado, olive oil, canola oil, and fish. DOES EVERYONE WITH DIABETES MELLITUS HAVE THE SAME MEAL PLAN? Because every person with diabetes mellitus is different, there is not one meal plan that works for everyone. It is very important that you meet with a dietitian who will help you create a meal plan that is just right for you.   This information is not intended to replace advice given to you by your health care provider. Make sure you discuss any questions you have with your health care provider.   Document Released: 08/05/2005 Document Revised: 11/29/2014 Document Reviewed: 10/05/2013 Elsevier Interactive Patient Education 2016 [employer] Inc.  

## 2015-10-01 NOTE — Progress Notes (Signed)
Pt's here for hospital f/up for CAD. Pt reports chest pain, and SOB x5days ago since hospital discharge. Pt states the chest pain throbbing pain. 8/10  Pt states he's have sharp pain in both legs x47mo described as a dull pain. Rated 8/10.  Pt states he's a diabetic.  Pt reports taking meds today.  Requesting refill of Medformin and pain med, ranexa.

## 2015-10-01 NOTE — Progress Notes (Signed)
ASSESSMENT: Pt currently experiencing adjustment reaction with anxiety and depression. He needs to f/u with PCP and [facility]; would benefit from psychoeducation and supportive counseling regarding coping with symptoms of anxiety and depression.  Stage of Change: contemplative  PLAN: 1. F/U with behavioral health consultant in one month. 2. Psychiatric Medications: none 3. Behavioral recommendation(s):   -Consider relaxation breathing exercise daily, as practiced in office visit -Consider reading educational material regarding coping with symptoms of anxiety and depression SUBJECTIVE: Pt. referred by Dr Jarold Song for symptoms of anxiety and depression:  Pt. reports the following symptoms/concerns: Pt states that he has had thoughts of suicide yesterday afternoon, has never made an attempt, does not have a plan, his children and grandchildren keep him from an attempt. Pt says that he has been feeling increased feelings of depression, has never been on any BH medications; he thinks that these feelings come from having fear over hearing that he may need to have open heart surgery. He is waiting to hear back from [facility] about setting him up with [facility]. Duration of problem: increase in past month Severity: severe  OBJECTIVE: Orientation & Cognition: Oriented x3. Thought processes normal and appropriate to situation. Mood: teary. Affect: appropriate Appearance: appropriate Risk of harm to self or others: low risk of harm to self; no risk of harm to others Substance use: none Assessments administered: PHQ9: 22/ GAD7: 20  Diagnosis: Adjustment reaction with anxiety and depression CPT Code: F43.23 -------------------------------------------- Other(s) present in the room: none  Time spent with patient in exam room: 25 minutes

## 2015-10-01 NOTE — Progress Notes (Signed)
CC: Follow up from Hospitalization  HPI: Andrew West is a 49 y.o. male who comes in today for follow-up from hospitalization at [facility] between 09/25/15-09/26/15 for angina. Medical history is notable for DM,CAD s/p PCI/stent to LCx and OM1; cutting balloon angioplasty to OM 1, HLD, HTN, continuous tobacco abuse, chronic back pain s/p laminectomy/discectomy, depression.  He underwent LHC on 09/25/15 with totally occluded (In-stent re-stenosis) of the OM2 branch with sub-optimal PTCA of the mid AV-Groove In-stent restenosis. No obvious options to reopen the occluded OM 2 -- could consider Rotational Atherectomy for De-bulking of Neo-intimal hyperplasia- as per cardiology. He was commenced on Ranexa and subsequently discharged to follow-up with cardiology.  Interval history: He continues to complain of left-sided chest pain, unrelated to activity which is intermittent and admits to still smoking. Admits to some shortness of breath. He also has bilateral lower extremity pain for which he takes gabapentin which is ineffective, pain is present all the time and not just limited to walking. He would like to be referred to a dentist for persisting pain in his teeth.    Allergies  Allergen Reactions  . Iohexol     PT. TO BE PREMEDICATED PRIOR TO IV CONTRAST PER DR Kris Hartmann /MMS//12/15/15Desc: PT BECAME SOB AND CHEST TIGHTNESS AFTER CONTRAST INJECTION.  [name],RT-RCT., Onset Date: [id number]   . Vicodin [Hydrocodone-Acetaminophen] Itching and Rash   Past Medical History  Diagnosis Date  . Myocardial infarction ([place])     2002; treated with stent to mid L circumflex  . Hypercholesteremia   . Coronary artery disease   . Diabetes mellitus     Takes Metformin  . Hypertension     Takes Lisinopril   . GERD (gastroesophageal reflux disease)     Takes Dexilant  . Rhabdomyolysis     h/o, r/t statins  . Chronic leg pain     bilateral  . Chronic back pain   . Depression    Current  Outpatient Prescriptions on File Prior to Visit  Medication Sig Dispense Refill  . Albuterol Sulfate (PROAIR RESPICLICK) 782 (90 BASE) MCG/ACT AEPB Inhale 1 puff into the lungs every 6 (six) hours as needed (shortness of breath). 1 each 5  . aspirin 81 MG tablet Take 4 tablets (325 mg total) by mouth daily.    . clopidogrel (PLAVIX) 75 MG tablet Take 1 tablet (75 mg total) by mouth daily. 90 tablet 6  . furosemide (LASIX) 20 MG tablet Take 20 mg by mouth daily as needed for fluid or edema.    . isosorbide mononitrate (IMDUR) 30 MG 24 hr tablet Take 1 tablet (30 mg total) by mouth 2 (two) times daily. 180 tablet 3  . metFORMIN (GLUCOPHAGE) 1000 MG tablet Take 1 tablet (1,000 mg total) by mouth 2 (two) times daily with a meal. 60 tablet 11  . metoprolol (LOPRESSOR) 50 MG tablet Take 1 tablet (50 mg total) by mouth 2 (two) times daily. 180 tablet 3  . nitroGLYCERIN (NITROSTAT) 0.4 MG SL tablet Place 1 tablet (0.4 mg total) under the tongue every 5 (five) minutes as needed for chest pain. 25 tablet 11  . simvastatin (ZOCOR) 20 MG tablet Take 1 tablet (20 mg total) by mouth at bedtime. 30 tablet 11  . valsartan-hydrochlorothiazide (DIOVAN-HCT) 320-12.5 MG tablet Take 1 tablet by mouth daily. 90 tablet 6  . acetaminophen (TYLENOL) 500 MG tablet Take 1,000 mg by mouth every 6 (six) hours as needed for headache.     No current facility-administered medications  on file prior to visit.   Family History  Problem Relation Age of Onset  . Anesthesia problems Neg Hx   . Hypotension Neg Hx   . Malignant hyperthermia Neg Hx   . Pseudochol deficiency Neg Hx   . Hypertension Mother   . Heart attack Father   . Diabetes Mother   . Heart disease Brother     x 3   . Heart attack Brother     deceased  . Hypertension Sister   . Diabetes Sister    Social History   Social History  . Marital Status: Single    Spouse Name: N/A  . Number of Children: N/A  . Years of Education: N/A   Occupational History    . Not on file.   Social History Main Topics  . Smoking status: Former Smoker -- 0.25 packs/day for 25 years    Types: Cigarettes    Quit date: 05/24/2015  . Smokeless tobacco: Never Used  . Alcohol Use: No  . Drug Use: No  . Sexual Activity: Yes   Other Topics Concern  . Not on file   Social History Narrative    Review of Systems: Constitutional: Negative for fever, chills, diaphoresis, activity change, appetite change and fatigue. HENT: Negative for ear pain, nosebleeds, congestion, facial swelling, rhinorrhea, neck pain, neck stiffness and ear discharge.  Eyes: Negative for pain, discharge, redness, itching and visual disturbance. Respiratory: Negative for cough, choking, chest tightness, shortness of breath, wheezing and stridor.  Cardiovascular: positive for chest pain, negative for palpitations and leg swelling. Gastrointestinal: Negative for abdominal distention. Genitourinary: Negative for dysuria, urgency, frequency, hematuria, flank pain, decreased urine volume, difficulty urinating and dyspareunia.  Musculoskeletal: positive for bilateral lower extremity pains Neurological: Negative for dizziness, tremors, seizures, syncope, facial asymmetry, speech difficulty, weakness, light-headedness, numbness and headaches.  Hematological: Negative for adenopathy. Does not bruise/bleed easily. Psychiatric/Behavioral: Negative for hallucinations, behavioral problems, confusion, dysphoric mood, decreased concentration and agitation.    Objective:   Filed Vitals:   10/01/15 1546  BP: 152/100  Pulse: 90  Temp: 98.6 F (37 C)  Resp: 18    Physical Exam: Constitutional: Patient appears well-developed and well-nourished. No distress. HENT: Normocephalic, atraumatic, External right and left ear normal. Oropharynx is clear and moist.  Eyes: Conjunctivae and EOM are normal. PERRLA, no scleral icterus. Neck: Normal ROM. Neck supple. No JVD. No tracheal deviation. No  thyromegaly. CVS: RRR, S1/S2 +, no murmurs, no gallops, no carotid bruit.  Pulmonary: Effort and breath sounds normal, no stridor, rhonchi, wheezes, rales.  Abdominal: Soft. BS +,  no distension, tenderness, rebound or guarding.  Musculoskeletal: Normal range of motion. No edema and no tenderness.  Lymphadenopathy: No lymphadenopathy noted, cervical, inguinal or axillary Neuro: Alert. Normal reflexes, muscle tone coordination. No cranial nerve deficit. Skin: Skin is warm and dry. No rash noted. Not diaphoretic. No erythema. No pallor. Psychiatric: Normal mood and affect. Behavior, judgment, thought content normal.  Lab Results  Component Value Date   WBC 12.8* 09/26/2015   HGB 13.4 09/26/2015   HCT 41.6 09/26/2015   MCV 80.5 09/26/2015   PLT 241 09/26/2015   Lab Results  Component Value Date   CREATININE 0.90 09/26/2015   BUN 9 09/26/2015   NA 139 09/26/2015   K 4.1 09/26/2015   CL 102 09/26/2015   CO2 26 09/26/2015    Lab Results  Component Value Date   HGBA1C 6.70 10/01/2015   Lipid Panel     Component Value Date/Time  CHOL  06/03/2010 0435    112        ATP III CLASSIFICATION:  <200     mg/dL   Desirable  200-239  mg/dL   Borderline High  >=240    mg/dL   High          TRIG 267* 06/03/2010 0435   HDL 20* 06/03/2010 0435   CHOLHDL 5.6 06/03/2010 0435   VLDL 53* 06/03/2010 0435   LDLCALC  06/03/2010 0435    39        Total Cholesterol/HDL:CHD Risk Coronary Heart Disease Risk Table                     Men   Women  1/2 Average Risk   3.4   3.3  Average Risk       5.0   4.4  2 X Average Risk   9.6   7.1  3 X Average Risk  23.4   11.0        Use the calculated Patient Ratio above and the CHD Risk Table to determine the patient's CHD Risk.        ATP III CLASSIFICATION (LDL):  <100     mg/dL   Optimal  100-129  mg/dL   Near or Above                    Optimal  130-159  mg/dL   Borderline  160-189  mg/dL   High  >190     mg/dL   Very High        Assessment and plan:  Coronary artery disease with persisting angina: Residual stenosis still present. Aggressive risk factor modification including smoking cessation. Continue Ranexa Advised to keep appointment with cardiology on 10/09/59.  Hypertension: Uncontrolled. He is on both lisinopril and Diovan/HCTZ and so I have discontinued lisinopril and increased the dose of Diovan/HCTZ Increase metoprolol to 100 mg twice daily  Diabetes mellitus: Controlled with A1c of 6.7. Continue metformin. We will address foot exam, microalbumin, Pneumovax at next office visit.  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 Placed on tramadol.  Dental disorder: Referred to Dentist as per request.  This note has been created with Dragon speech recognition software and smart Company secretary. Any transcriptional errors are unintentional.          Arnoldo Morale, MD. [facility] [phone] 10/01/2015, 4:28 PM

## 2015-10-03 DIAGNOSIS — I2510 Atherosclerotic heart disease of native coronary artery without angina pectoris: Secondary | ICD-10-CM | POA: Insufficient documentation

## 2015-10-03 DIAGNOSIS — I10 Essential (primary) hypertension: Secondary | ICD-10-CM | POA: Insufficient documentation

## 2015-10-03 DIAGNOSIS — Z9861 Coronary angioplasty status: Secondary | ICD-10-CM

## 2015-10-03 NOTE — Telephone Encounter (Signed)
Called patient at [phone] - same message received as previously documented.   Attempted to call on new mobile # listed - [phone] - "call cannot be completed as dialed"   Unable to reach patient at any numbers provided. Encounter closed. Will wait for patient to call back.

## 2015-10-03 NOTE — Telephone Encounter (Signed)
I'll try to do it this weekend. Hopefully, he will contact us to pick it up Monday.  Dr. Lemmie Evens

## 2015-10-06 ENCOUNTER — Other Ambulatory Visit: Payer: Self-pay | Admitting: Internal Medicine

## 2015-10-07 ENCOUNTER — Telehealth: Payer: Self-pay | Admitting: Internal Medicine

## 2015-10-07 ENCOUNTER — Telehealth: Payer: Self-pay | Admitting: *Deleted

## 2015-10-07 NOTE — Telephone Encounter (Signed)
Andrew West is calling because the letter in which was given to him for his job. His job couldn't accept the letter that was given , but need the short term disability forms that he gave to Dr. Debara Pickett . Please Call   Thanks

## 2015-10-07 NOTE — Telephone Encounter (Signed)
Faxed order for phase 2 cardiac rehab No GXT required

## 2015-10-07 NOTE — Telephone Encounter (Signed)
I spoke with patient this morning and informed him that MD wrote a letter for him to be out of work. Patient came to pick up today.   He informed me this AM he needs some paperwork filled out regarding his car payments and other personal loans.   Paperwork is on MD cart

## 2015-10-07 NOTE — Telephone Encounter (Signed)
Pt calling about status of short term disability forms.

## 2015-10-09 ENCOUNTER — Ambulatory Visit (INDEPENDENT_AMBULATORY_CARE_PROVIDER_SITE_OTHER): Payer: BC Managed Care – PPO | Admitting: Internal Medicine

## 2015-10-09 ENCOUNTER — Encounter: Payer: Self-pay | Admitting: Internal Medicine

## 2015-10-09 VITALS — BP 116/82 | HR 72 | Ht 68.0 in | Wt 265.0 lb

## 2015-10-09 DIAGNOSIS — J449 Chronic obstructive pulmonary disease, unspecified: Secondary | ICD-10-CM

## 2015-10-09 DIAGNOSIS — I214 Non-ST elevation (NSTEMI) myocardial infarction: Secondary | ICD-10-CM | POA: Diagnosis not present

## 2015-10-09 DIAGNOSIS — E669 Obesity, unspecified: Secondary | ICD-10-CM

## 2015-10-09 DIAGNOSIS — R0609 Other forms of dyspnea: Secondary | ICD-10-CM

## 2015-10-09 DIAGNOSIS — I252 Old myocardial infarction: Secondary | ICD-10-CM

## 2015-10-09 DIAGNOSIS — J45909 Unspecified asthma, uncomplicated: Secondary | ICD-10-CM

## 2015-10-09 NOTE — Patient Instructions (Addendum)
Please take RANEXA '1000MG'$  twice daily  Your physician recommends that you schedule a follow-up appointment in: 3 months.           ranexa '1000mg'$  samples - 4 packs

## 2015-10-09 NOTE — Progress Notes (Signed)
OFFICE NOTE  Chief Complaint:   persistent chest pain, improved,  Significant shortness of breath  Primary Care Physician: Vonna Drafts., FNP  HPI:  Andrew West  is a 49 year old gentleman with a history of coronary artery disease and stent placement to the circumflex and obtuse marginal and a history of in stent restenosis to the LAD. There is also a nondominant right coronary artery that is 100% occluded, filled with collaterals. He did have a stress test recently, which showed an EF of 53% and reversible septal ischemia in the setting of chest pain and after much convincing underwent cardiac catheterization.  He then underwent cutting balloon angioplasty to an ostial lateral OM1 branch and the bifurcation AV groove circumflex OM junction. This was successful at reducing the stenosis to 0%. This was in November 2012 and he did not return for followup appointment until April 2013. He was suffering from low back pain and has been evaluated and treated by Dr. Trenton Gammon with a laminectomy/discectomy, which he says was not helpful. Otherwise, he continues to smoke and occasionally complains of shortness of breath and some chest pain symptoms which are atypical. He is on long-acting and/or has not needed to take short-acting nitroglycerin. His other concern today is that he is having problems with his teeth and was recommended to have edentulation and by an oral surgeon Dr. Diona Browner. Finally, he is suffering from significant stress and depression due to recent separation with wife in dealing with his kids. This seems to be a big tablets on his continued smoking.  Andrew West returns today in the office. He feels fairly well. He denies any chest pain. He continues to have problems with low back pain and numbness and tingling in his legs. He is apparently status post laminectomy and microdiscectomy by Dr. Trenton Gammon and continues to have symptoms which may be related to that. He's also  complaining of what sounds like neuropathic pain. He is not currently on medication. He is asking for Tylenol 3 for pain today which I told him I did not prescribe. Ultimately there are no further surgical or nonsurgical options, he may need to go on a neuropathic pain medication or perhaps be referred to a pain management specialist. He is also looking for a new primary care provider as his primary care provider is close to retirement. He reports he has cut back his smoking to about 1 pack every 2 weeks. He is also been out of amlodipine and simvastatin although his blood pressure is well controlled.  I saw Andrew West in the office today. He is reporting now complains of shortness of breath and chest pain. He also feels like his breathing is worse when working around chemicals at work. He uses a respiratory protection mask but also feels like he is under significant stress. He's had missed several days of work and is concerned about his job. Sounds like he is in a difficult work environment. He says that he is apologizing for "deceiving me" that he was not honest about his symptoms. He apparently has been having shortness of breath and chest pain for several months and did not relay that to me.  Andrew West returns to the office today for follow-up. His main complaint is shortness of breath. He's not feeling as much chest pain he's had been previously. We recently did a nuclear stress test which was negative for ischemia and showed an EF of 56%. With regards to shortness of breath he's concerned most of this  was related to chemicals at work although he has not worked in several months. He's had about 20 pound weight gain since we last saw him in the office and denies any lower extremity swelling, orthopnea or PND. Shortness of breath is persistent and is associated with some wheezing. He did see Dr. 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 Shortness of breath seems to be a little bit worse with exertion which makes me wonder whether there is an element of exercise-induced pulmonary hypertension. He's not had an echocardiogram in some time.  Andrew West returns today for follow-up. He was noted to have some diastolic dysfunction on his echocardiogram. I started him on low-dose Lasix and check lab work including a BNP which is very low around 50. On follow-up today he reports he feels no different with the addition of Lasix. I therefore asked him to take it as needed. He is also describing worsening substernal chest discomfort and a squeezing pressure in his chest today. He says this is been getting worse over the past several days, more than his typical chest discomfort. As previously noted he recently underwent a nuclear stress test a few months ago which was negative for ischemia.   Andrew West returns for follow-up. As mentioned he had had recent progressive chest pain symptoms and worsening shortness of breath. He underwent another cardiac catheterization which demonstrated the following:   Ost 2nd Mrg to 2nd Mrg lesion, 99% stenosed. Post intervention, 99% residual stenosis remained. The lesion was previously treated with a bare metal stentgreater than two years ago.  Mid Cx-2 lesion, 90% stenosed. Post intervention, there is a 20% residual stenosis.  Mid Cx-1 lesion, 80% stenosed. Post intervention, there is a 50% residual stenosis. The lesion was previously treated with a bare metal stent.  Ramus lesion, 100% stenosed. The lesion was previously treated with a bare metal stentgreater than two years ago.  Prox RCA lesion, 100% stenosed.  There is mild left ventricular systolic dysfunction.  Moderately elevated LVEDP of 20 mmHg   Difficult situation with what amounts to be a totally occluded (In-stent re-stenosis) of the OM2 branch with sub-optimal  PTCA of the mid AV-Groove In-stent restenosis. He continues to have chest pain although reports it somewhat improved. He was started on ranolazine and currently is on 1000 mg twice a day.  PMHx:  Past Medical History  Diagnosis Date  . Myocardial infarction ([place])     2002; treated with stent to mid L circumflex  . Hypercholesteremia   . Coronary artery disease   . Diabetes mellitus     Takes Metformin  . Hypertension     Takes Lisinopril   . GERD (gastroesophageal reflux disease)     Takes Dexilant  . Rhabdomyolysis     h/o, r/t statins  . Chronic leg pain     bilateral  . Chronic back pain   . Depression     Past Surgical History  Procedure Laterality Date  . Colonoscopy w/ polypectomy    . Lumbar laminectomy/decompression microdiscectomy  03/31/2012    Procedure: LUMBAR LAMINECTOMY/DECOMPRESSION MICRODISCECTOMY 1 LEVEL;  Surgeon: Charlie Pitter, MD;  Location: [facility];  Service: Neurosurgery;  Laterality: Left;  . Transthoracic echocardiogram  07/28/2011    EF 55-65%; LVH, grade 1 diastolic dysfunction;   . Left heart catheterization with coronary angiogram N/A 10/18/2011  Procedure: LEFT HEART CATHETERIZATION WITH CORONARY ANGIOGRAM;  Surgeon: Leonie Man, MD;  Location: [facility] CATH LAB;  Service: Cardiovascular;  Laterality: N/A;  . Coronary angioplasty with stent placement  10/09/2001    PTCA & stenting of mid AV circumflex; 2.5x18m Pixel stent  . Coronary angioplasty with stent placement  12/13/2001    PCI with stent to mid L circumflex, 95% stenosis to 0% residual  . Coronary angioplasty with stent placement  10/10/2003    PCI to mid AV circumflex; LAD 30% disease; RCA 100% occluded prox.  . Coronary angioplasty with stent placement  09/01/2011    PCI with stenting with bare metal stent to mid AV groove circumflex and PDA  . Coronary angioplasty with stent placement  10/17/2011    cutting balloon angioplasty of ostial lateral OM1 branch and bifurcation AV groove  circumflex OM junction; stenosis reduced to 0%  . Coronary angioplasty  09/25/2015    mid cir & om  . Cardiac catheterization N/A 09/25/2015    Procedure: Left Heart Cath and Coronary Angiography;  Surgeon: DLeonie Man MD;  [facility];  Service: Cardiovascular;  Laterality: N/A;    FAMHx:  Family History  Problem Relation Age of Onset  . Anesthesia problems Neg Hx   . Hypotension Neg Hx   . Malignant hyperthermia Neg Hx   . Pseudochol deficiency Neg Hx   . Hypertension Mother   . Heart attack Father   . Diabetes Mother   . Heart disease Brother     x 3   . Heart attack Brother     deceased  . Hypertension Sister   . Diabetes Sister     SOCHx:   reports that he quit smoking about 4 months ago. His smoking use included Cigarettes. He has a 6.25 pack-year smoking history. He has never used smokeless tobacco. He reports that he does not drink alcohol or use illicit drugs.  ALLERGIES:  Allergies  Allergen Reactions  . Iohexol     PT. TO BE PREMEDICATED PRIOR TO IV CONTRAST PER DR SKris Hartmann/MMS//12/15/15Desc: PT BECAME SOB AND CHEST TIGHTNESS AFTER CONTRAST INJECTION.  [name],RT-RCT., Onset Date: [id number]  . Vicodin [Hydrocodone-Acetaminophen] Itching and Rash    ROS: A comprehensive review of systems was negative except for: Constitutional: positive for fatigue Cardiovascular: positive for chest pressure/discomfort, dyspnea and lower extremity edema Musculoskeletal: positive for arthralgias, back pain and neuropathy  HOME MEDS: Current Outpatient Prescriptions  Medication Sig Dispense Refill  . acetaminophen (TYLENOL) 500 MG tablet Take 1,000 mg by mouth every 6 (six) hours as needed for headache.    . Albuterol Sulfate (PROAIR RESPICLICK) 1614(90 BASE) MCG/ACT AEPB Inhale 1 puff into the lungs every 6 (six) hours as needed (shortness of breath). 1 each 2  . aspirin 325 MG tablet Take 1 tablet (325 mg total) by mouth daily. 30 tablet 2  .  clopidogrel (PLAVIX) 75 MG tablet Take 1 tablet (75 mg total) by mouth daily. 30 tablet 2  . furosemide (LASIX) 20 MG tablet Take 1 tablet (20 mg total) by mouth daily as needed for fluid or edema. 30 tablet 2  . isosorbide mononitrate (IMDUR) 30 MG 24 hr tablet Take 1 tablet (30 mg total) by mouth 2 (two) times daily. 180 tablet 3  . ketorolac (ACULAR) 0.5 % ophthalmic solution 1 drop 4 (four) times daily.    . metFORMIN (GLUCOPHAGE) 1000 MG tablet Take 1 tablet (1,000 mg total) by mouth 2 (two) times  daily with a meal. 60 tablet 2  . metoprolol (LOPRESSOR) 100 MG tablet Take 0.5 tablets (50 mg total) by mouth 2 (two) times daily. 60 tablet 2  . mometasone (ASMANEX) 220 MCG/INH inhaler Inhale 2 puffs into the lungs daily. 1 Inhaler 2  . nitroGLYCERIN (NITROSTAT) 0.4 MG SL tablet Place 1 tablet (0.4 mg total) under the tongue every 5 (five) minutes as needed for chest pain. 25 tablet 11  . ranolazine (RANEXA) 1000 MG SR tablet Take 500 mg by mouth 2 (two) times daily.    . simvastatin (ZOCOR) 20 MG tablet Take 1 tablet (20 mg total) by mouth at bedtime. 30 tablet 2  . sulfacetamide (BLEPH-10) 10 % ophthalmic solution 1 drop every 3 (three) hours.    . traMADol (ULTRAM) 50 MG tablet Take 1 tablet (50 mg total) by mouth every 8 (eight) hours as needed. 30 tablet 0  . valsartan-hydrochlorothiazide (DIOVAN HCT) 320-25 MG tablet Take 1 tablet by mouth daily. 30 tablet 2   No current facility-administered medications for this visit.    LABS/IMAGING: No results found for this or any previous visit (from the past 48 hour(s)). No results found.  VITALS: BP 116/82 mmHg  Pulse 72  Ht '5\' 8"'$  (1.727 m)  Wt 265 lb (120.203 kg)  BMI 40.30 kg/m2  EXAM: Deferred  EKG: Deferred   ASSESSMENT:   Chest pain and dyspnea on exertion - Occluded OM2 at a prior stent - no good revascularization options  Coronary artery disease status post PCI and cutting balloon angioplasty over 2 years ago - negative  Lexiscan for ischemia  Ongoing tobacco abuse  Hypertension  Dyslipidemia  Neuropathy  Persistent low back pain  Diabetes type 2  Morbid obesity  PLAN: 1.   Andrew West continues to have shortness of breath and chest pain. Cardiac catheterization indicated in an occluded OM 2 and a prior placed stents. He has multiple stents , but continues to have chest pain. No clear interventional options were noted for his OM 2  Restenosis. It is not clear whether he could be a coronary artery bypass candidate , but we have been hesitant to recommend that given his young age. For now, I'm recommending he stay on the  Higher dose of Ranexa 1000 mg twice a day.  At this point he has significant chest pain and shortness of breath  Which makes it almost impossible for him to do his job which requires a good amount of physical work. He certainly is not not able to lift greater than about 10 pounds or walk for distances more than 100 yards  Without significant shortness of breath.  He may qualify for disability based on these findings. I've provided a temporary work excuse until the first of the year and will plan to see him back. If he continues to have chest pain symptoms at that time, I may refer him for bypass surgery evaluation  Since there is significant disease in the circumflex marginal, ramus and right coronary artery branches. Surprisingly, the LAD is essentially spared.   Pixie Casino, MD, [facility] Attending Cardiologist [place] 10/09/2015, 5:11 PM

## 2015-10-09 NOTE — Telephone Encounter (Signed)
Patient seen in office 11/17 - paperwork completed by MD and given to patient.   Copy kept for records.

## 2015-10-10 ENCOUNTER — Telehealth: Payer: Self-pay | Admitting: *Deleted

## 2015-10-10 NOTE — Telephone Encounter (Signed)
Faxed orders for phase 2 cardiac rehab - no GXT required.  

## 2015-10-13 ENCOUNTER — Telehealth: Payer: Self-pay | Admitting: Internal Medicine

## 2015-10-13 ENCOUNTER — Telehealth: Payer: Self-pay | Admitting: Nurse Practitioner

## 2015-10-13 NOTE — Telephone Encounter (Signed)
Pt is calling in wanting to speak with the nurse about a paper that Dr. Debara Pickett gave him about when he could return to work . Please f/u with him  Thanks

## 2015-10-13 NOTE — Telephone Encounter (Signed)
Patient called stating that he is having heartburn and is unable to sleep at night. Patient would like something to treat. Please f/u

## 2015-10-13 NOTE — Telephone Encounter (Signed)
Pt called stating that insurance needs copy of letter from MD stating pt cannot return to work due to his medical condition.  Reviewed last OV and spoke with Dr. Debara Pickett, who agreed pt can not return to current position with current cardiac disposition.   Letter completed, signed by MD, and sent to medical records to be faxed to insurance, per pt request.

## 2015-10-15 ENCOUNTER — Ambulatory Visit: Payer: Self-pay | Admitting: Internal Medicine

## 2015-10-20 ENCOUNTER — Telehealth: Payer: Self-pay | Admitting: Internal Medicine

## 2015-10-20 NOTE — Telephone Encounter (Signed)
Dr. Wallace Going DDS calling w/ questions r/t pt meds and potential extraction of teeth. He is not planning to discontinue pt's plavix for a series of extractions. He voiced understanding of our recommendations. Advised to call if planning multiple extractions and wanting pt off blood thinning meds.

## 2015-10-20 NOTE — Telephone Encounter (Signed)
Patient is having a lot of pain on his R leg and this weekend he started having pain on both legs. Please follow up with patient for advise. Patient was placed on tramadol and gabapentin and they are not working. Please follow up with pt. Thank you.

## 2015-10-21 ENCOUNTER — Encounter: Payer: Self-pay | Admitting: Internal Medicine

## 2015-10-21 NOTE — Telephone Encounter (Signed)
CMA called patient, patient verified name and DOB. Pt c/o leg pain x1wk. Pt states that tramadol and gabapentin is not working for pain. Pt inquiring about other meds that can help with pain. He's also asking for a refill of Dexilent that he use to take for GERD.

## 2015-10-22 ENCOUNTER — Telehealth: Payer: Self-pay

## 2015-10-22 NOTE — Telephone Encounter (Signed)
-----   Message from Arnoldo Morale, MD sent at 10/22/2015  2:08 PM EST ----- Regarding: RE: Pain/GERD medication Okay to refill Dexilant. He will need an office visit if he still has leg pains as he would need to be evaluated for peripheral vascular disease.  Thanks. ----- Message -----    From: [employer], CMA    Sent: 10/22/2015   1:57 PM      To: Arnoldo Morale, MD Subject: Pain/GERD medication                           Pt c/o leg pain x1wk. Pt states that tramadol and gabapentin is not working for pain. He's inquiring about other meds that can help with the pain. He's also asking for a refill of Dexilent that he use to take for GERD.   Please advise

## 2015-10-22 NOTE — Telephone Encounter (Signed)
CMA called patient, patient did not answer. Left a message for Andrew West to call me when he receive my message. CMA will go ahead and refill patient medication per Dr. Jarold Song approval.

## 2015-10-23 ENCOUNTER — Other Ambulatory Visit: Payer: Self-pay

## 2015-10-23 NOTE — Telephone Encounter (Signed)
CMA called Dr. Jarold Song. CMA was having problems with e-scribing patient Dexilant to be sent over at  [facility]. Dr. Jarold Song agreed to send over the Rx for me for the patient.

## 2015-10-24 ENCOUNTER — Other Ambulatory Visit: Payer: Self-pay | Admitting: Family Medicine

## 2015-10-24 ENCOUNTER — Telehealth: Payer: Self-pay

## 2015-10-24 MED ORDER — DEXLANSOPRAZOLE 60 MG PO CPDR: 60.0000 mg | DELAYED_RELEASE_CAPSULE | Freq: Every day | ORAL | Status: DC

## 2015-10-24 NOTE — Progress Notes (Signed)
Patient states he was previously on Dexilant even though it does not appear on his med list; I have placed an order for this as per request.

## 2015-10-24 NOTE — Telephone Encounter (Signed)
CMA called patient, patient verified name and DOB. CMA was calling to let patient know that his meds were ready for pick-up. Patient informed me that he was here earlier, but left due to the long line to the Pharmacy. Patient states that he will try and get back by here before the Pharmacy close.

## 2015-10-31 ENCOUNTER — Ambulatory Visit: Payer: BC Managed Care – PPO | Attending: Family Medicine | Admitting: Family Medicine

## 2015-10-31 ENCOUNTER — Encounter: Payer: Self-pay | Admitting: Family Medicine

## 2015-10-31 VITALS — BP 150/94 | HR 82 | Temp 98.9°F | Resp 14 | Ht 68.0 in | Wt 266.6 lb

## 2015-10-31 DIAGNOSIS — E1165 Type 2 diabetes mellitus with hyperglycemia: Secondary | ICD-10-CM | POA: Diagnosis not present

## 2015-10-31 DIAGNOSIS — M5126 Other intervertebral disc displacement, lumbar region: Secondary | ICD-10-CM | POA: Diagnosis not present

## 2015-10-31 DIAGNOSIS — I252 Old myocardial infarction: Secondary | ICD-10-CM | POA: Diagnosis not present

## 2015-10-31 DIAGNOSIS — Z79899 Other long term (current) drug therapy: Secondary | ICD-10-CM | POA: Diagnosis not present

## 2015-10-31 DIAGNOSIS — F329 Major depressive disorder, single episode, unspecified: Secondary | ICD-10-CM | POA: Diagnosis not present

## 2015-10-31 DIAGNOSIS — Z7984 Long term (current) use of oral hypoglycemic drugs: Secondary | ICD-10-CM | POA: Insufficient documentation

## 2015-10-31 DIAGNOSIS — I25110 Atherosclerotic heart disease of native coronary artery with unstable angina pectoris: Secondary | ICD-10-CM | POA: Diagnosis not present

## 2015-10-31 DIAGNOSIS — F17210 Nicotine dependence, cigarettes, uncomplicated: Secondary | ICD-10-CM | POA: Insufficient documentation

## 2015-10-31 DIAGNOSIS — E1140 Type 2 diabetes mellitus with diabetic neuropathy, unspecified: Secondary | ICD-10-CM | POA: Insufficient documentation

## 2015-10-31 DIAGNOSIS — E7800 Pure hypercholesterolemia, unspecified: Secondary | ICD-10-CM | POA: Insufficient documentation

## 2015-10-31 DIAGNOSIS — Z7982 Long term (current) use of aspirin: Secondary | ICD-10-CM | POA: Diagnosis not present

## 2015-10-31 DIAGNOSIS — I10 Essential (primary) hypertension: Secondary | ICD-10-CM | POA: Diagnosis not present

## 2015-10-31 DIAGNOSIS — Z885 Allergy status to narcotic agent status: Secondary | ICD-10-CM | POA: Diagnosis not present

## 2015-10-31 DIAGNOSIS — K219 Gastro-esophageal reflux disease without esophagitis: Secondary | ICD-10-CM | POA: Diagnosis not present

## 2015-10-31 DIAGNOSIS — Z9861 Coronary angioplasty status: Secondary | ICD-10-CM | POA: Diagnosis not present

## 2015-10-31 DIAGNOSIS — E119 Type 2 diabetes mellitus without complications: Secondary | ICD-10-CM | POA: Diagnosis not present

## 2015-10-31 LAB — GLUCOSE, POCT (MANUAL RESULT ENTRY): POC GLUCOSE: 166 mg/dL — AB (ref 70–99)

## 2015-10-31 MED ORDER — METOPROLOL TARTRATE 100 MG PO TABS: 100.0000 mg | ORAL_TABLET | Freq: Two times a day (BID) | ORAL | Status: DC

## 2015-10-31 MED ORDER — TRAMADOL HCL 50 MG PO TABS: 50.0000 mg | ORAL_TABLET | Freq: Two times a day (BID) | ORAL | Status: DC | PRN

## 2015-10-31 MED ORDER — ESOMEPRAZOLE MAGNESIUM 40 MG PO CPDR: 40.0000 mg | DELAYED_RELEASE_CAPSULE | Freq: Every day | ORAL | Status: DC

## 2015-10-31 NOTE — Progress Notes (Signed)
Follow up on his DM2 States he checks his sugars at home but forgot his log States sugars range from low 80's -200 Reports 5/10 dull, burning leg pain for months now Tramadol prescribed last visit helps some He would like a refill Is waiting on a refill of his dexlansoprazole-he has not taken it for several months and states he has talked to our pharmacy who needs to speak to his insurance company??

## 2015-10-31 NOTE — Progress Notes (Signed)
Subjective:    Patient ID: Andrew West, male    DOB: September 16, 1966, 49 y.o.   MRN: [id number]  HPI Starsky Ozaeta is a 49 y.o. With a  Medical history of DM,CAD s/p PCI/stent to LCx and OM1; cutting balloon angioplasty to OM 1, HLD, HTN, continuous tobacco abuse, chronic back pain s/p laminectomy/discectomy, depression. He underwent LHC on 09/25/15 with totally occluded (In-stent re-stenosis) of the OM2 branch with sub-optimal PTCA of the mid AV-Groove In-stent restenosis. No obvious options to reopen the occluded OM 2 -- could consider Rotational Atherectomy for De-bulking of Neo-intimal hyperplasia- as per cardiology  Today he complains of persisting foot pain which she describes as burning for which I had commenced tramadol at his last office visit and he continues to take Neurontin with no much improvement in symptoms.  He denies chest pain or shortness of breath or pedal edema at this time  Past Medical History  Diagnosis Date  . Myocardial infarction (HCC)     2002; treated with stent to mid L circumflex  . Hypercholesteremia   . Coronary artery disease   . Diabetes mellitus     Takes Metformin  . Hypertension     Takes Lisinopril   . GERD (gastroesophageal reflux disease)     Takes Dexilant  . Rhabdomyolysis     h/o, r/t statins  . Chronic leg pain     bilateral  . Chronic back pain   . Depression     Past Surgical History  Procedure Laterality Date  . Colonoscopy w/ polypectomy    . Lumbar laminectomy/decompression microdiscectomy  03/31/2012    Procedure: LUMBAR LAMINECTOMY/DECOMPRESSION MICRODISCECTOMY 1 LEVEL;  Surgeon: Temple Pacini, MD;  Location: [facility];  Service: Neurosurgery;  Laterality: Left;  . Transthoracic echocardiogram  07/28/2011    EF 55-65%; LVH, grade 1 diastolic dysfunction;   . Left heart catheterization with coronary angiogram N/A 10/18/2011    Procedure: LEFT HEART CATHETERIZATION WITH CORONARY ANGIOGRAM;  Surgeon: Marykay Lex, MD;   Location: [facility] CATH LAB;  Service: Cardiovascular;  Laterality: N/A;  . Coronary angioplasty with stent placement  10/09/2001    PTCA & stenting of mid AV circumflex; 2.5x100mm Pixel stent  . Coronary angioplasty with stent placement  12/13/2001    PCI with stent to mid L circumflex, 95% stenosis to 0% residual  . Coronary angioplasty with stent placement  10/10/2003    PCI to mid AV circumflex; LAD 30% disease; RCA 100% occluded prox.  . Coronary angioplasty with stent placement  09/01/2011    PCI with stenting with bare metal stent to mid AV groove circumflex and PDA  . Coronary angioplasty with stent placement  10/17/2011    cutting balloon angioplasty of ostial lateral OM1 branch and bifurcation AV groove circumflex OM junction; stenosis reduced to 0%  . Coronary angioplasty  09/25/2015    mid cir & om  . Cardiac catheterization N/A 09/25/2015    Procedure: Left Heart Cath and Coronary Angiography;  Surgeon: Marykay Lex, MD;  Location: [facility] INVASIVE CV LAB;  Service: Cardiovascular;  Laterality: N/A;    Allergies  Allergen Reactions  . Iohexol     PT. TO BE PREMEDICATED PRIOR TO IV CONTRAST PER DR Eppie Gibson /MMS//12/15/15Desc: PT BECAME SOB AND CHEST TIGHTNESS AFTER CONTRAST INJECTION.  [name],RT-RCT., Onset Date: [id number]   . Vicodin [Hydrocodone-Acetaminophen] Itching and Rash    Current Outpatient Prescriptions on File Prior to Visit  Medication Sig Dispense Refill  . acetaminophen (TYLENOL)  500 MG tablet Take 1,000 mg by mouth every 6 (six) hours as needed for headache.    . Albuterol Sulfate (PROAIR RESPICLICK) 108 (90 BASE) MCG/ACT AEPB Inhale 1 puff into the lungs every 6 (six) hours as needed (shortness of breath). 1 each 2  . aspirin 325 MG tablet Take 1 tablet (325 mg total) by mouth daily. 30 tablet 2  . clopidogrel (PLAVIX) 75 MG tablet Take 1 tablet (75 mg total) by mouth daily. 30 tablet 2  . furosemide (LASIX) 20 MG tablet Take 1 tablet (20 mg total) by mouth daily  as needed for fluid or edema. 30 tablet 2  . isosorbide mononitrate (IMDUR) 30 MG 24 hr tablet Take 1 tablet (30 mg total) by mouth 2 (two) times daily. 180 tablet 3  . metFORMIN (GLUCOPHAGE) 1000 MG tablet Take 1 tablet (1,000 mg total) by mouth 2 (two) times daily with a meal. 60 tablet 2  . mometasone (ASMANEX) 220 MCG/INH inhaler Inhale 2 puffs into the lungs daily. 1 Inhaler 2  . nitroGLYCERIN (NITROSTAT) 0.4 MG SL tablet Place 1 tablet (0.4 mg total) under the tongue every 5 (five) minutes as needed for chest pain. 25 tablet 11  . ranolazine (RANEXA) 1000 MG SR tablet Take 500 mg by mouth 2 (two) times daily.    . simvastatin (ZOCOR) 20 MG tablet Take 1 tablet (20 mg total) by mouth at bedtime. 30 tablet 2  . valsartan-hydrochlorothiazide (DIOVAN HCT) 320-25 MG tablet Take 1 tablet by mouth daily. 30 tablet 2   No current facility-administered medications on file prior to visit.      Review of Systems  Constitutional: Negative for activity change and appetite change.  HENT: Negative for sinus pressure and sore throat.   Eyes: Negative for visual disturbance.  Respiratory: Negative for cough, chest tightness and shortness of breath.   Cardiovascular: Negative for chest pain and leg swelling.  Gastrointestinal: Negative for abdominal pain, diarrhea, constipation and abdominal distention.  Endocrine: Negative.   Genitourinary: Negative for dysuria.  Musculoskeletal: Negative for myalgias and joint swelling.       Feet pain  Skin: Negative for rash.  Allergic/Immunologic: Negative.   Neurological: Positive for numbness. Negative for weakness and light-headedness.  Psychiatric/Behavioral: Negative for suicidal ideas and dysphoric mood.       Objective: Filed Vitals:   10/31/15 0912  BP: 150/94  Pulse: 82  Temp: 98.9 F (37.2 C)  Resp: 14  Height: 5\' 8"  (1.727 m)  Weight: 266 lb 9.6 oz (120.929 kg)  SpO2: 94%      Physical Exam  Constitutional: He is oriented to person,  place, and time. He appears well-developed and well-nourished.  Cardiovascular: Normal rate, normal heart sounds and intact distal pulses.   No murmur heard. Pulmonary/Chest: Effort normal and breath sounds normal. He has no wheezes. He has no rales. He exhibits no tenderness.  Abdominal: Soft. Bowel sounds are normal. He exhibits no distension and no mass. There is no tenderness.  Musculoskeletal: Normal range of motion.  Neurological: He is alert and oriented to person, place, and time.          Assessment & Plan:  Coronary artery disease with persisting angina: Residual stenosis still present. Aggressive risk factor modification including smoking cessation. Continue Ranexa   Hypertension: Uncontrolled. Lifestyle modification and if blood pressure is still elevated at the next visit I will consider adding another antihypertensive  Diabetes mellitus: Controlled with A1c of 6.7. Continue metformin. foot exam today, up-to-date on  microalbumin, Pneumovax, eye exam.  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 Advised to bring in bottles so we can record the dose and he may need an increased dose Continue tramadol.  GERD: Dexilant not covered by insurance. Switch to Nexium.    This note has been created with Education officer, environmental. Any transcriptional errors are unintentional.

## 2015-11-12 ENCOUNTER — Telehealth: Payer: Self-pay | Admitting: Internal Medicine

## 2015-11-12 MED ORDER — RANOLAZINE ER 1000 MG PO TB12: 1000.0000 mg | ORAL_TABLET | Freq: Two times a day (BID) | ORAL | Status: DC

## 2015-11-12 NOTE — Telephone Encounter (Signed)
Patient calling the office for samples of medication:   1.  What medication and dosage are you requesting samples for? Ranexa  2.  Are you currently out of this medication?  No,a week left

## 2015-11-12 NOTE — Telephone Encounter (Signed)
Called pt to notify samples available, he voiced acknowledgement.

## 2015-11-13 ENCOUNTER — Other Ambulatory Visit: Payer: Self-pay | Admitting: Pharmacist Clinician (PhC)/ Clinical Pharmacy Specialist

## 2015-11-13 MED ORDER — PANTOPRAZOLE SODIUM 40 MG PO TBEC: 40.0000 mg | DELAYED_RELEASE_TABLET | Freq: Every day | ORAL | Status: DC

## 2015-11-13 NOTE — Telephone Encounter (Signed)
Patient on both esomprazole and clopidogrel - spoke with patient and will switch esomeprazole to pantoprazole.

## 2015-11-27 ENCOUNTER — Ambulatory Visit (HOSPITAL_COMMUNITY): Payer: Self-pay

## 2015-12-01 ENCOUNTER — Ambulatory Visit (HOSPITAL_COMMUNITY): Payer: Self-pay

## 2015-12-03 ENCOUNTER — Ambulatory Visit (HOSPITAL_COMMUNITY): Payer: Self-pay

## 2015-12-05 ENCOUNTER — Ambulatory Visit (HOSPITAL_COMMUNITY): Payer: Self-pay

## 2015-12-08 ENCOUNTER — Ambulatory Visit (HOSPITAL_COMMUNITY): Payer: Self-pay

## 2015-12-10 ENCOUNTER — Ambulatory Visit (HOSPITAL_COMMUNITY): Payer: Self-pay

## 2015-12-12 ENCOUNTER — Ambulatory Visit (HOSPITAL_COMMUNITY): Payer: Self-pay

## 2015-12-15 ENCOUNTER — Ambulatory Visit (HOSPITAL_COMMUNITY): Payer: Self-pay

## 2015-12-17 ENCOUNTER — Ambulatory Visit (HOSPITAL_COMMUNITY): Payer: Self-pay

## 2015-12-19 ENCOUNTER — Telehealth: Payer: Self-pay | Admitting: Internal Medicine

## 2015-12-19 ENCOUNTER — Ambulatory Visit (HOSPITAL_COMMUNITY): Payer: Self-pay

## 2015-12-19 MED ORDER — RANOLAZINE ER 1000 MG PO TB12: 1000.0000 mg | ORAL_TABLET | Freq: Two times a day (BID) | ORAL | Status: DC

## 2015-12-19 MED FILL — CLOPIDOGREL 75 MG TABLET: 75 | 30 days supply | Qty: 30 | Fill #0

## 2015-12-19 MED FILL — metFORMIN HCL 1000 MG TABS: 1000 | 30 days supply | Qty: 60 | Fill #1

## 2015-12-19 MED FILL — VALSARTAN-HCTZ 320-25 MG TA: 320-25 | 30 days supply | Qty: 30 | Fill #1

## 2015-12-19 MED FILL — PANTOPRAZOLE SOD DR 40 MG T: 40 | 30 days supply | Qty: 30 | Fill #0

## 2015-12-19 NOTE — Telephone Encounter (Signed)
Spoke with pt, he is requesting samples of ranexa. Aware we have no samples at this time. Refill sent to the pharmacy electronically.

## 2015-12-19 NOTE — Telephone Encounter (Signed)
New message      Pt is calling to talk to the nurse.  He would not tell me what he wanted

## 2015-12-22 ENCOUNTER — Ambulatory Visit (HOSPITAL_COMMUNITY): Payer: Self-pay

## 2015-12-24 ENCOUNTER — Telehealth: Payer: Self-pay | Admitting: Nurse Practitioner

## 2015-12-24 ENCOUNTER — Ambulatory Visit (HOSPITAL_COMMUNITY): Payer: Self-pay

## 2015-12-24 NOTE — Telephone Encounter (Signed)
Patient called stating they've been experiencing neck pain, which is believed to be causing an imbalance. Patient would like advice on what to do. Please follow up

## 2015-12-26 ENCOUNTER — Ambulatory Visit (HOSPITAL_COMMUNITY): Payer: Self-pay

## 2015-12-26 ENCOUNTER — Telehealth: Payer: Self-pay | Admitting: Internal Medicine

## 2015-12-26 NOTE — Telephone Encounter (Signed)
Patient has had pain intermittently for three days since last night Pain worse this morning with heavy pressure across his chest Denies shortness of breath or sweating but feels nauseated Has Nitroglycerin but has not taken any. Advised since he is having active chest pain he needs to call 9-1-1 for evaluation and transport to the ED States he will

## 2015-12-26 NOTE — Telephone Encounter (Signed)
New message      Pt c/o of Chest Pain: STAT if CP now or developed within 24 hours  1. Are you having CP right now?  yes 2. Are you experiencing any other symptoms (ex. SOB, nausea, vomiting, sweating)? nausea 3. How long have you been experiencing CP? 3 days 4. Is your CP continuous or coming and going? continuous 5. Have you taken Nitroglycerin? no?

## 2015-12-29 ENCOUNTER — Telehealth: Payer: Self-pay | Admitting: *Deleted

## 2015-12-29 ENCOUNTER — Ambulatory Visit (HOSPITAL_COMMUNITY): Payer: Self-pay

## 2015-12-29 NOTE — Telephone Encounter (Signed)
Patient called in with medical advice request. Attempted to contact patient and left HIPAA compliant message for him to return RN call at [phone]

## 2015-12-31 ENCOUNTER — Ambulatory Visit (HOSPITAL_COMMUNITY): Payer: Self-pay

## 2016-01-02 ENCOUNTER — Ambulatory Visit (HOSPITAL_COMMUNITY): Payer: Self-pay

## 2016-01-05 ENCOUNTER — Telehealth: Payer: Self-pay | Admitting: Internal Medicine

## 2016-01-05 ENCOUNTER — Ambulatory Visit (HOSPITAL_COMMUNITY): Payer: Self-pay

## 2016-01-05 MED ORDER — RANOLAZINE ER 1000 MG PO TB12: 1000.0000 mg | ORAL_TABLET | Freq: Two times a day (BID) | ORAL | Status: DC

## 2016-01-05 NOTE — Telephone Encounter (Signed)
New message       Patient calling the office for samples of medication:   1.  What medication and dosage are you requesting samples for? ranexa '1000mg'$   2.  Are you currently out of this medication? yes

## 2016-01-05 NOTE — Telephone Encounter (Signed)
Samples available for 2 weeks. patient states he has been out since Friday Patient will pick up samples

## 2016-01-07 ENCOUNTER — Ambulatory Visit (HOSPITAL_COMMUNITY): Payer: Self-pay

## 2016-01-09 ENCOUNTER — Ambulatory Visit (HOSPITAL_COMMUNITY): Payer: Self-pay

## 2016-01-12 ENCOUNTER — Ambulatory Visit (HOSPITAL_COMMUNITY): Payer: Self-pay

## 2016-01-14 ENCOUNTER — Ambulatory Visit (HOSPITAL_COMMUNITY): Payer: Self-pay

## 2016-01-15 MED FILL — VALSARTAN-HCTZ 320-25 MG TA: 320-25 | 30 days supply | Qty: 30 | Fill #2

## 2016-01-15 MED FILL — FUROSEMIDE 20 MG TABLET: 20 | 30 days supply | Qty: 30 | Fill #1

## 2016-01-15 MED FILL — METOPROLOL TARTRATE 100 MG: 100 | 60 days supply | Qty: 60 | Fill #1

## 2016-01-15 MED FILL — PANTOPRAZOLE SOD DR 40 MG T: 40 | 90 days supply | Qty: 90 | Fill #1

## 2016-01-15 MED FILL — SIMVASTATIN 20 MG TABLET: 20 | 30 days supply | Qty: 30 | Fill #0

## 2016-01-15 MED FILL — CLOPIDOGREL 75 MG TABLET: 75 | 90 days supply | Qty: 90 | Fill #1

## 2016-01-15 MED FILL — metFORMIN HCL 1000 MG TABS: 1000 | 30 days supply | Qty: 60 | Fill #2

## 2016-01-16 ENCOUNTER — Ambulatory Visit (HOSPITAL_COMMUNITY): Payer: Self-pay

## 2016-01-19 ENCOUNTER — Encounter: Payer: Self-pay | Admitting: Internal Medicine

## 2016-01-19 ENCOUNTER — Ambulatory Visit (HOSPITAL_COMMUNITY): Payer: Self-pay

## 2016-01-19 ENCOUNTER — Ambulatory Visit (INDEPENDENT_AMBULATORY_CARE_PROVIDER_SITE_OTHER): Payer: BC Managed Care – PPO | Admitting: Internal Medicine

## 2016-01-19 VITALS — BP 150/98 | HR 77 | Ht 68.0 in | Wt 265.1 lb

## 2016-01-19 DIAGNOSIS — R0609 Other forms of dyspnea: Secondary | ICD-10-CM

## 2016-01-19 DIAGNOSIS — I10 Essential (primary) hypertension: Secondary | ICD-10-CM

## 2016-01-19 DIAGNOSIS — I25118 Atherosclerotic heart disease of native coronary artery with other forms of angina pectoris: Secondary | ICD-10-CM | POA: Diagnosis not present

## 2016-01-19 DIAGNOSIS — Z955 Presence of coronary angioplasty implant and graft: Secondary | ICD-10-CM

## 2016-01-19 DIAGNOSIS — J45909 Unspecified asthma, uncomplicated: Secondary | ICD-10-CM

## 2016-01-19 DIAGNOSIS — E669 Obesity, unspecified: Secondary | ICD-10-CM | POA: Diagnosis not present

## 2016-01-19 DIAGNOSIS — J449 Chronic obstructive pulmonary disease, unspecified: Secondary | ICD-10-CM

## 2016-01-19 MED ORDER — SIMVASTATIN 20 MG PO TABS: 20.0000 mg | ORAL_TABLET | Freq: Every day | ORAL | Status: DC

## 2016-01-19 MED ORDER — CLOPIDOGREL BISULFATE 75 MG PO TABS: 75.0000 mg | ORAL_TABLET | Freq: Every day | ORAL | Status: DC

## 2016-01-19 NOTE — Progress Notes (Signed)
OFFICE NOTE  Chief Complaint:  Sharp chest pain, improved, shortness of breath  Primary Care Physician: Vonna Drafts., FNP  HPI:  Andrew West  is a 50 year old gentleman with a history of coronary artery disease and stent placement to the circumflex and obtuse marginal and a history of in stent restenosis to the LAD. There is also a nondominant right coronary artery that is 100% occluded, filled with collaterals. He did have a stress test recently, which showed an EF of 53% and reversible septal ischemia in the setting of chest pain and after much convincing underwent cardiac catheterization.  He then underwent cutting balloon angioplasty to an ostial lateral OM1 branch and the bifurcation AV groove circumflex OM junction. This was successful at reducing the stenosis to 0%. This was in November 2012 and he did not return for followup appointment until April 2013. He was suffering from low back pain and has been evaluated and treated by Dr. Trenton Gammon with a laminectomy/discectomy, which he says was not helpful. Otherwise, he continues to smoke and occasionally complains of shortness of breath and some chest pain symptoms which are atypical. He is on long-acting and/or has not needed to take short-acting nitroglycerin. His other concern today is that he is having problems with his teeth and was recommended to have edentulation and by an oral surgeon Dr. Diona Browner. Finally, he is suffering from significant stress and depression due to recent separation with wife in dealing with his kids. This seems to be a big tablets on his continued smoking.  Andrew West returns today in the office. He feels fairly well. He denies any chest pain. He continues to have problems with low back pain and numbness and tingling in his legs. He is apparently status post laminectomy and microdiscectomy by Dr. Trenton Gammon and continues to have symptoms which may be related to that. He's also complaining of what  sounds like neuropathic pain. He is not currently on medication. He is asking for Tylenol 3 for pain today which I told him I did not prescribe. Ultimately there are no further surgical or nonsurgical options, he may need to go on a neuropathic pain medication or perhaps be referred to a pain management specialist. He is also looking for a new primary care provider as his primary care provider is close to retirement. He reports he has cut back his smoking to about 1 pack every 2 weeks. He is also been out of amlodipine and simvastatin although his blood pressure is well controlled.  I saw Andrew West in the office today. He is reporting now complains of shortness of breath and chest pain. He also feels like his breathing is worse when working around chemicals at work. He uses a respiratory protection mask but also feels like he is under significant stress. He's had missed several days of work and is concerned about his job. Sounds like he is in a difficult work environment. He says that he is apologizing for "deceiving me" that he was not honest about his symptoms. He apparently has been having shortness of breath and chest pain for several months and did not relay that to me.  Andrew West returns to the office today for follow-up. His main complaint is shortness of breath. He's not feeling as much chest pain he's had been previously. We recently did a nuclear stress test which was negative for ischemia and showed an EF of 56%. With regards to shortness of breath he's concerned most of this was related to  chemicals at work although he has not worked in several months. He's had about 20 pound weight gain since we last saw him in the office and denies any lower extremity swelling, orthopnea or PND. Shortness of breath is persistent and is associated with some wheezing. He did see Dr. 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 Shortness of breath seems to be a little bit worse with exertion which makes me wonder whether there is an element of exercise-induced pulmonary hypertension. He's not had an echocardiogram in some time.  Andrew West returns today for follow-up. He was noted to have some diastolic dysfunction on his echocardiogram. I started him on low-dose Lasix and check lab work including a BNP which is very low around 50. On follow-up today he reports he feels no different with the addition of Lasix. I therefore asked him to take it as needed. He is also describing worsening substernal chest discomfort and a squeezing pressure in his chest today. He says this is been getting worse over the past several days, more than his typical chest discomfort. As previously noted he recently underwent a nuclear stress test a few months ago which was negative for ischemia.   Andrew West returns for follow-up. As mentioned he had had recent progressive chest pain symptoms and worsening shortness of breath. He underwent another cardiac catheterization which demonstrated the following:   Ost 2nd Mrg to 2nd Mrg lesion, 99% stenosed. Post intervention, 99% residual stenosis remained. The lesion was previously treated with a bare metal stentgreater than two years ago.  Mid Cx-2 lesion, 90% stenosed. Post intervention, there is a 20% residual stenosis.  Mid Cx-1 lesion, 80% stenosed. Post intervention, there is a 50% residual stenosis. The lesion was previously treated with a bare metal stent.  Ramus lesion, 100% stenosed. The lesion was previously treated with a bare metal stentgreater than two years ago.  Prox RCA lesion, 100% stenosed.  There is mild left ventricular systolic dysfunction.  Moderately elevated LVEDP of 20 mmHg   Difficult situation with what amounts to be a totally occluded (In-stent re-stenosis) of the OM2 branch with sub-optimal PTCA of the mid  AV-Groove In-stent restenosis. He continues to have chest pain although reports it somewhat improved. He was started on ranolazine and currently is on 1000 mg twice a day.  Andrew West returns today for follow-up. He reports he is doing fairly well on medical therapy. He still gets some sharp chest pain mostly along the right sternal border. He does get short of breath with moderate exertion. He's trying to do some walking on a treadmill but generally stops the exercise once he gets short of breath because he is very nervous. He is not currently working due to combined cardiac pulmonary and musculoskeletal diseases. He says that he and his wife are going to go on a delayed honeymoon since they never took one.  PMHx:  Past Medical History  Diagnosis Date  . Myocardial infarction ([place])     2002; treated with stent to mid L circumflex  . Hypercholesteremia   . Coronary artery disease   . Diabetes mellitus     Takes Metformin  . Hypertension     Takes Lisinopril   . GERD (gastroesophageal reflux disease)     Takes Dexilant  . Rhabdomyolysis     h/o, r/t statins  . Chronic leg  pain     bilateral  . Chronic back pain   . Depression     Past Surgical History  Procedure Laterality Date  . Colonoscopy w/ polypectomy    . Lumbar laminectomy/decompression microdiscectomy  03/31/2012    Procedure: LUMBAR LAMINECTOMY/DECOMPRESSION MICRODISCECTOMY 1 LEVEL;  Surgeon: Charlie Pitter, MD;  Location: [facility];  Service: Neurosurgery;  Laterality: Left;  . Transthoracic echocardiogram  07/28/2011    EF 55-65%; LVH, grade 1 diastolic dysfunction;   . Left heart catheterization with coronary angiogram N/A 10/18/2011    Procedure: LEFT HEART CATHETERIZATION WITH CORONARY ANGIOGRAM;  Surgeon: Leonie Man, MD;  Location[facility];  Service: Cardiovascular;  Laterality: N/A;  . Coronary angioplasty with stent placement  10/09/2001    PTCA & stenting of mid AV circumflex; 2.5x58m Pixel stent  .  Coronary angioplasty with stent placement  12/13/2001    PCI with stent to mid L circumflex, 95% stenosis to 0% residual  . Coronary angioplasty with stent placement  10/10/2003    PCI to mid AV circumflex; LAD 30% disease; RCA 100% occluded prox.  . Coronary angioplasty with stent placement  09/01/2011    PCI with stenting with bare metal stent to mid AV groove circumflex and PDA  . Coronary angioplasty with stent placement  10/17/2011    cutting balloon angioplasty of ostial lateral OM1 branch and bifurcation AV groove circumflex OM junction; stenosis reduced to 0%  . Coronary angioplasty  09/25/2015    mid cir & om  . Cardiac catheterization N/A 09/25/2015    Procedure: Left Heart Cath and Coronary Angiography;  Surgeon: DLeonie Man MD;  [facility];  Service: Cardiovascular;  Laterality: N/A;    FAMHx:  Family History  Problem Relation Age of Onset  . Anesthesia problems Neg Hx   . Hypotension Neg Hx   . Malignant hyperthermia Neg Hx   . Pseudochol deficiency Neg Hx   . Hypertension Mother   . Heart attack Father   . Diabetes Mother   . Heart disease Brother     x 3   . Heart attack Brother     deceased  . Hypertension Sister   . Diabetes Sister     SOCHx:   reports that he quit smoking about 7 months ago. His smoking use included Cigarettes. He has a 6.25 pack-year smoking history. He has never used smokeless tobacco. He reports that he does not drink alcohol or use illicit drugs.  ALLERGIES:  Allergies  Allergen Reactions  . Iohexol     PT. TO BE PREMEDICATED PRIOR TO IV CONTRAST PER DR SKris Hartmann/MMS//12/15/15Desc: PT BECAME SOB AND CHEST TIGHTNESS AFTER CONTRAST INJECTION.  [name],RT-RCT., Onset Date: [id number]  . Vicodin [Hydrocodone-Acetaminophen] Itching and Rash    ROS: A comprehensive review of systems was negative except for: Constitutional: positive for fatigue Cardiovascular: positive for chest pressure/discomfort, dyspnea and  lower extremity edema Musculoskeletal: positive for arthralgias, back pain and neuropathy  HOME MEDS: Current Outpatient Prescriptions  Medication Sig Dispense Refill  . acetaminophen (TYLENOL) 500 MG tablet Take 1,000 mg by mouth every 6 (six) hours as needed for headache.    . Albuterol Sulfate (PROAIR RESPICLICK) 1540(90 BASE) MCG/ACT AEPB Inhale 1 puff into the lungs every 6 (six) hours as needed (shortness of breath). 1 each 2  . amoxicillin (AMOXIL) 500 MG capsule Take 2 capsules by mouth daily. For 30 days    . aspirin 325 MG tablet Take  1 tablet (325 mg total) by mouth daily. 30 tablet 2  . clopidogrel (PLAVIX) 75 MG tablet Take 1 tablet (75 mg total) by mouth daily. 90 tablet 3  . furosemide (LASIX) 20 MG tablet Take 1 tablet (20 mg total) by mouth daily as needed for fluid or edema. 30 tablet 2  . HYDROcodone-acetaminophen (NORCO) 7.5-325 MG tablet Take 1 tablet by mouth as needed. Every 4-6 hours  0  . isosorbide mononitrate (IMDUR) 30 MG 24 hr tablet Take 1 tablet (30 mg total) by mouth 2 (two) times daily. 180 tablet 3  . metFORMIN (GLUCOPHAGE) 1000 MG tablet Take 1 tablet (1,000 mg total) by mouth 2 (two) times daily with a meal. 60 tablet 2  . metoprolol (LOPRESSOR) 100 MG tablet Take 1 tablet (100 mg total) by mouth 2 (two) times daily. 60 tablet 2  . mometasone (ASMANEX) 220 MCG/INH inhaler Inhale 2 puffs into the lungs daily. 1 Inhaler 2  . nitroGLYCERIN (NITROSTAT) 0.4 MG SL tablet Place 1 tablet (0.4 mg total) under the tongue every 5 (five) minutes as needed for chest pain. 25 tablet 11  . pantoprazole (PROTONIX) 40 MG tablet Take 1 tablet (40 mg total) by mouth daily. 30 tablet 11  . ranolazine (RANEXA) 1000 MG SR tablet Take 1 tablet (1,000 mg total) by mouth 2 (two) times daily. 28 tablet 0  . simvastatin (ZOCOR) 20 MG tablet Take 1 tablet (20 mg total) by mouth at bedtime. 90 tablet 3  . traMADol (ULTRAM) 50 MG tablet Take 1 tablet (50 mg total) by mouth every 12  (twelve) hours as needed. 60 tablet 1  . valsartan-hydrochlorothiazide (DIOVAN HCT) 320-25 MG tablet Take 1 tablet by mouth daily. 30 tablet 2   No current facility-administered medications for this visit.    LABS/IMAGING: No results found for this or any previous visit (from the past 48 hour(s)). No results found.  VITALS: BP 150/98 mmHg  Pulse 77  Ht '5\' 8"'$  (1.727 m)  Wt 265 lb 1.6 oz (120.249 kg)  BMI 40.32 kg/m2  EXAM: General appearance: alert, no distress and morbidly obese Neck: no carotid bruit and no JVD Lungs: clear to auscultation bilaterally Heart: regular rate and rhythm, S1, S2 normal, no murmur, click, rub or gallop Abdomen: soft, non-tender; bowel sounds normal; no masses,  no organomegaly Extremities: extremities normal, atraumatic, no cyanosis or edema Pulses: 2+ and symmetric Skin: Skin color, texture, turgor normal. No rashes or lesions Neurologic: Grossly normal Psych: Pleasant  EKG: Normal sinus rhythm at 77  ASSESSMENT:  1. Chest pain and dyspnea on exertion - Occluded OM2 at a prior stent - no good revascularization options 2. Coronary artery disease status post PCI and cutting balloon angioplasty over 2 years ago - negative Lexiscan for ischemia 3. Ongoing tobacco abuse 4. Hypertension 5. Dyslipidemia 6. Neuropathy 7. Persistent low back pain 8. Diabetes type 2 9. Morbid obesity  PLAN: 1.   Andrew West seems to be fairly stable on his current medications. He is not having a significant worsening angina. His shortness of breath is fairly stable. He tells me that he is contemplating some dental work including extractions. At this point he is far enough out from balloon angioplasty that he could hold his Plavix for 5 days prior to any significant high bleeding risk procedure and restarted more than 24 hours afterwards. I provided some paperwork for this to Dr. Renard Hamper with [facility].  Follow-up with me in 6 months.   Nadean Corwin  Debara Pickett, MD, [facility] Attending Cardiologist [place] 01/19/2016, 2:13 PM

## 2016-01-19 NOTE — Patient Instructions (Addendum)
Your physician wants you to follow-up in: 6 months with Dr. Debara Pickett. You will receive a reminder letter in the mail two months in advance. If you don't receive a letter, please call our office to schedule the follow-up appointment.   ranexa '1000mg'$  samples - 2 boxes - provided to patient

## 2016-01-20 ENCOUNTER — Telehealth: Payer: Self-pay | Admitting: Internal Medicine

## 2016-01-20 NOTE — Telephone Encounter (Signed)
Faxed clearance to [facility] for dental extractions with Midge Minium, DDS  Per Dr. Debara Pickett: patient is at medium risk, does NOT require antibiotic prophylaxis, no contraindications to local anesthetic with epinephrine, will need to hold Plavix 5 days prior to surgery and resume 24 hours after dental extractions Per Dr. Debara Pickett: patient is cleared for dental procedure  Phone: [phone] Fax: [phone]

## 2016-01-21 ENCOUNTER — Ambulatory Visit: Payer: Self-pay | Admitting: Family Medicine

## 2016-01-21 ENCOUNTER — Ambulatory Visit (HOSPITAL_COMMUNITY): Payer: Self-pay

## 2016-01-23 ENCOUNTER — Ambulatory Visit (HOSPITAL_COMMUNITY): Payer: Self-pay

## 2016-01-26 ENCOUNTER — Ambulatory Visit (HOSPITAL_COMMUNITY): Payer: Self-pay

## 2016-01-28 ENCOUNTER — Ambulatory Visit (HOSPITAL_COMMUNITY): Payer: Self-pay

## 2016-01-30 ENCOUNTER — Ambulatory Visit (HOSPITAL_COMMUNITY): Payer: Self-pay

## 2016-02-02 ENCOUNTER — Ambulatory Visit (HOSPITAL_COMMUNITY): Payer: Self-pay

## 2016-02-02 ENCOUNTER — Ambulatory Visit: Payer: Self-pay | Admitting: Family Medicine

## 2016-02-04 ENCOUNTER — Ambulatory Visit (HOSPITAL_COMMUNITY): Payer: Self-pay

## 2016-02-04 ENCOUNTER — Telehealth: Payer: Self-pay | Admitting: Internal Medicine

## 2016-02-04 NOTE — Telephone Encounter (Signed)
Returned call to patient Ranexa 500 mg samples advised to take 2 tablets to equal 1000 mg left at front desk of [place] office.

## 2016-02-04 NOTE — Telephone Encounter (Signed)
Patient calling the office for samples of medication:   1.  What medication and dosage are you requesting samples for?Ranexa  2.  Are you currently out of this medication? No,3 left

## 2016-02-06 ENCOUNTER — Ambulatory Visit (HOSPITAL_COMMUNITY): Payer: Self-pay

## 2016-02-09 ENCOUNTER — Ambulatory Visit (HOSPITAL_COMMUNITY): Payer: Self-pay

## 2016-02-11 ENCOUNTER — Ambulatory Visit (HOSPITAL_COMMUNITY): Payer: Self-pay

## 2016-02-13 ENCOUNTER — Ambulatory Visit (HOSPITAL_COMMUNITY): Payer: Self-pay

## 2016-02-16 ENCOUNTER — Ambulatory Visit (HOSPITAL_COMMUNITY): Payer: Self-pay

## 2016-02-18 ENCOUNTER — Ambulatory Visit (HOSPITAL_COMMUNITY): Payer: Self-pay

## 2016-02-19 ENCOUNTER — Encounter: Payer: Self-pay | Admitting: Internal Medicine

## 2016-02-19 ENCOUNTER — Telehealth: Payer: Self-pay | Admitting: Internal Medicine

## 2016-02-19 NOTE — Telephone Encounter (Signed)
Patient called because he needs [place] to do something for him. He had to go to social services this AM. He needs a note to social services saying that he cannot work any more and that he is disabled.   Letter to Attn: Social Services  Will forward to MD to [employer]

## 2016-02-19 NOTE — Telephone Encounter (Signed)
Letter composed by MD  Patient aware it is ready for pick up

## 2016-02-19 NOTE — Telephone Encounter (Signed)
Pt calling to speak to the nurse regarding a note he needs-would rather go into detail with nurse

## 2016-02-20 ENCOUNTER — Ambulatory Visit (HOSPITAL_COMMUNITY): Payer: Self-pay

## 2016-02-23 ENCOUNTER — Ambulatory Visit (HOSPITAL_COMMUNITY): Payer: Self-pay

## 2016-02-25 ENCOUNTER — Ambulatory Visit: Payer: BC Managed Care – PPO | Attending: Family Medicine | Admitting: Family Medicine

## 2016-02-25 ENCOUNTER — Encounter: Payer: Self-pay | Admitting: Family Medicine

## 2016-02-25 ENCOUNTER — Ambulatory Visit (HOSPITAL_COMMUNITY): Payer: Self-pay

## 2016-02-25 VITALS — BP 150/90 | HR 91 | Temp 98.6°F | Resp 15 | Ht 68.0 in | Wt 264.4 lb

## 2016-02-25 DIAGNOSIS — E785 Hyperlipidemia, unspecified: Secondary | ICD-10-CM | POA: Diagnosis not present

## 2016-02-25 DIAGNOSIS — Z7982 Long term (current) use of aspirin: Secondary | ICD-10-CM | POA: Insufficient documentation

## 2016-02-25 DIAGNOSIS — I10 Essential (primary) hypertension: Secondary | ICD-10-CM | POA: Insufficient documentation

## 2016-02-25 DIAGNOSIS — F17210 Nicotine dependence, cigarettes, uncomplicated: Secondary | ICD-10-CM | POA: Diagnosis not present

## 2016-02-25 DIAGNOSIS — Z79899 Other long term (current) drug therapy: Secondary | ICD-10-CM | POA: Insufficient documentation

## 2016-02-25 DIAGNOSIS — K219 Gastro-esophageal reflux disease without esophagitis: Secondary | ICD-10-CM | POA: Diagnosis not present

## 2016-02-25 DIAGNOSIS — E119 Type 2 diabetes mellitus without complications: Secondary | ICD-10-CM | POA: Diagnosis not present

## 2016-02-25 DIAGNOSIS — M5116 Intervertebral disc disorders with radiculopathy, lumbar region: Secondary | ICD-10-CM

## 2016-02-25 DIAGNOSIS — M5010 Cervical disc disorder with radiculopathy, unspecified cervical region: Secondary | ICD-10-CM

## 2016-02-25 DIAGNOSIS — I2510 Atherosclerotic heart disease of native coronary artery without angina pectoris: Secondary | ICD-10-CM | POA: Diagnosis present

## 2016-02-25 DIAGNOSIS — Z955 Presence of coronary angioplasty implant and graft: Secondary | ICD-10-CM

## 2016-02-25 LAB — GLUCOSE, POCT (MANUAL RESULT ENTRY): POC Glucose: 129 mg/dl — AB (ref 70–99)

## 2016-02-25 LAB — POCT GLYCOSYLATED HEMOGLOBIN (HGB A1C): Hemoglobin A1C: 6.1

## 2016-02-25 MED ORDER — MOMETASONE FUROATE 220 MCG/INH IN AEPB: 2.0000 | INHALATION_SPRAY | Freq: Every day | RESPIRATORY_TRACT | Status: DC

## 2016-02-25 MED ORDER — VALSARTAN-HYDROCHLOROTHIAZIDE 320-25 MG PO TABS: 1.0000 | ORAL_TABLET | Freq: Every day | ORAL | Status: DC

## 2016-02-25 MED ORDER — SIMVASTATIN 20 MG PO TABS: 20.0000 mg | ORAL_TABLET | Freq: Every day | ORAL | Status: DC

## 2016-02-25 MED ORDER — ALBUTEROL SULFATE 108 (90 BASE) MCG/ACT IN AEPB: 1.0000 | INHALATION_SPRAY | Freq: Four times a day (QID) | RESPIRATORY_TRACT | Status: DC | PRN

## 2016-02-25 MED ORDER — FUROSEMIDE 20 MG PO TABS: 20.0000 mg | ORAL_TABLET | Freq: Every day | ORAL | Status: DC | PRN

## 2016-02-25 MED ORDER — METOPROLOL TARTRATE 100 MG PO TABS: 100.0000 mg | ORAL_TABLET | Freq: Two times a day (BID) | ORAL | Status: DC

## 2016-02-25 MED ORDER — METFORMIN HCL 1000 MG PO TABS: 1000.0000 mg | ORAL_TABLET | Freq: Two times a day (BID) | ORAL | Status: DC

## 2016-02-25 MED ORDER — ISOSORBIDE MONONITRATE ER 30 MG PO TB24: 60.0000 mg | ORAL_TABLET | Freq: Two times a day (BID) | ORAL | Status: DC

## 2016-02-25 MED ORDER — ACETAMINOPHEN-CODEINE #3 300-30 MG PO TABS: 1.0000 | ORAL_TABLET | Freq: Four times a day (QID) | ORAL | Status: DC | PRN

## 2016-02-25 MED ORDER — PANTOPRAZOLE SODIUM 40 MG PO TBEC: 40.0000 mg | DELAYED_RELEASE_TABLET | Freq: Every day | ORAL | Status: DC

## 2016-02-25 MED FILL — VALSARTAN-HCTZ 320-25 MG TA: 320-25 | 30 days supply | Qty: 30 | Fill #0

## 2016-02-25 MED FILL — METOPROLOL TARTRATE 100 MG: 100 | 30 days supply | Qty: 60 | Fill #0

## 2016-02-25 MED FILL — metFORMIN HCL 1000 MG TABS: 1000 | 30 days supply | Qty: 60 | Fill #0

## 2016-02-25 MED FILL — SIMVASTATIN 20 MG TABLET: 20 | 30 days supply | Qty: 30 | Fill #0

## 2016-02-25 MED FILL — FUROSEMIDE 20 MG TABLET: 20 | 30 days supply | Qty: 30 | Fill #0

## 2016-02-25 NOTE — Patient Instructions (Signed)
Hypertension Hypertension, commonly called high blood pressure, is when the force of blood pumping through your arteries is too strong. Your arteries are the blood vessels that carry blood from your heart throughout your body. A blood pressure reading consists of a higher number over a lower number, such as 110/72. The higher number (systolic) is the pressure inside your arteries when your heart pumps. The lower number (diastolic) is the pressure inside your arteries when your heart relaxes. Ideally you want your blood pressure below 120/80. Hypertension forces your heart to work harder to pump blood. Your arteries may become narrow or stiff. Having untreated or uncontrolled hypertension can cause heart attack, stroke, kidney disease, and other problems. RISK FACTORS Some risk factors for high blood pressure are controllable. Others are not.  Risk factors you cannot control include:   Race. You may be at higher risk if you are African American.  Age. Risk increases with age.  Gender. Men are at higher risk than women before age 45 years. After age 65, women are at higher risk than men. Risk factors you can control include:  Not getting enough exercise or physical activity.  Being overweight.  Getting too much fat, sugar, calories, or salt in your diet.  Drinking too much alcohol. SIGNS AND SYMPTOMS Hypertension does not usually cause signs or symptoms. Extremely high blood pressure (hypertensive crisis) may cause headache, anxiety, shortness of breath, and nosebleed. DIAGNOSIS To check if you have hypertension, your health care provider will measure your blood pressure while you are seated, with your arm held at the level of your heart. It should be measured at least twice using the same arm. Certain conditions can cause a difference in blood pressure between your right and left arms. A blood pressure reading that is higher than normal on one occasion does not mean that you need treatment. If  it is not clear whether you have high blood pressure, you may be asked to return on a different day to have your blood pressure checked again. Or, you may be asked to monitor your blood pressure at home for 1 or more weeks. TREATMENT Treating high blood pressure includes making lifestyle changes and possibly taking medicine. Living a healthy lifestyle can help lower high blood pressure. You may need to change some of your habits. Lifestyle changes may include:  Following the DASH diet. This diet is high in fruits, vegetables, and whole grains. It is low in salt, red meat, and added sugars.  Keep your sodium intake below 2,300 mg per day.  Getting at least 30-45 minutes of aerobic exercise at least 4 times per week.  Losing weight if necessary.  Not smoking.  Limiting alcoholic beverages.  Learning ways to reduce stress. Your health care provider may prescribe medicine if lifestyle changes are not enough to get your blood pressure under control, and if one of the following is true:  You are 18-59 years of age and your systolic blood pressure is above 140.  You are 60 years of age or older, and your systolic blood pressure is above 150.  Your diastolic blood pressure is above 90.  You have diabetes, and your systolic blood pressure is over 140 or your diastolic blood pressure is over 90.  You have kidney disease and your blood pressure is above 140/90.  You have heart disease and your blood pressure is above 140/90. Your personal target blood pressure may vary depending on your medical conditions, your age, and other factors. HOME CARE INSTRUCTIONS    Have your blood pressure rechecked as directed by your health care provider.   Take medicines only as directed by your health care provider. Follow the directions carefully. Blood pressure medicines must be taken as prescribed. The medicine does not work as well when you skip doses. Skipping doses also puts you at risk for  problems.  Do not smoke.   Monitor your blood pressure at home as directed by your health care provider. SEEK MEDICAL CARE IF:   You think you are having a reaction to medicines taken.  You have recurrent headaches or feel dizzy.  You have swelling in your ankles.  You have trouble with your vision. SEEK IMMEDIATE MEDICAL CARE IF:  You develop a severe headache or confusion.  You have unusual weakness, numbness, or feel faint.  You have severe chest or abdominal pain.  You vomit repeatedly.  You have trouble breathing. MAKE SURE YOU:   Understand these instructions.  Will watch your condition.  Will get help right away if you are not doing well or get worse.   This information is not intended to replace advice given to you by your health care provider. Make sure you discuss any questions you have with your health care provider.   Document Released: 11/08/2005 Document Revised: 03/25/2015 Document Reviewed: 08/31/2013 Elsevier Interactive Patient Education 2016 [employer] Inc.  

## 2016-02-25 NOTE — Progress Notes (Signed)
Chronic bilateral leg pain that is not improving He states it is a sharp, burning pain Also reports left sided neck pain H/o back surgery 3 years ago Would like referral to spine specialist [facility] in [place]-he is unhappy with all practices Refill on Tramadol and all other meds

## 2016-02-26 ENCOUNTER — Ambulatory Visit: Payer: BC Managed Care – PPO

## 2016-02-26 MED FILL — ISOSORBIDE MN ER 30 MG TAB: 30 | 15 days supply | Qty: 60 | Fill #0

## 2016-02-26 NOTE — Progress Notes (Signed)
Subjective:    Patient ID: Andrew West, male    DOB: 1966-06-26, 50 y.o.   MRN: [id number]  HPI Andrew West is a 50 y.o. with a  Medical history of Type 2 DM (A1c 6.1),CAD s/p PCI/stent to LCx and OM1; cutting balloon angioplasty to OM 1, HLD, HTN, continuous tobacco abuse, chronic back pain s/p laminectomy/discectomy (3 years ago), depression who comes into the clinic for follow-up visit.  He was seen by cardiology 2 months ago and there were no changes to his regimen.  He continues to complain of low back pain with radiation to his lower extremity (which worsened after he took a fall last year); complains his legs have been giving out and he has fallen recently. Pain is worse on his left and he also complains of pain on the left side from his legs to his left cervical region. He complains of persisting foot pain which he describes as burning for which I had commenced tramadol at his last office visit and he continues to take Neurontin, Lyrica, flexerl with no much improvement in symptoms. He denies loss of sphincteric function. Would like to see a spine surgeon other than [facility] orthopedics.  He denies chest pain or shortness of breath or pedal edema at this time.  Past Medical History  Diagnosis Date  . Myocardial infarction (HCC)     2002; treated with stent to mid L circumflex  . Hypercholesteremia   . Coronary artery disease   . Diabetes mellitus     Takes Metformin  . Hypertension     Takes Lisinopril   . GERD (gastroesophageal reflux disease)     Takes Dexilant  . Rhabdomyolysis     h/o, r/t statins  . Chronic leg pain     bilateral  . Chronic back pain   . Depression     Past Surgical History  Procedure Laterality Date  . Colonoscopy w/ polypectomy    . Lumbar laminectomy/decompression microdiscectomy  03/31/2012    Procedure: LUMBAR LAMINECTOMY/DECOMPRESSION MICRODISCECTOMY 1 LEVEL;  Surgeon: Temple Pacini, MD;  Location: [facility] NEURO ORS;  Service:  Neurosurgery;  Laterality: Left;  . Transthoracic echocardiogram  07/28/2011    EF 55-65%; LVH, grade 1 diastolic dysfunction;   . Left heart catheterization with coronary angiogram N/A 10/18/2011    Procedure: LEFT HEART CATHETERIZATION WITH CORONARY ANGIOGRAM;  Surgeon: Marykay Lex, MD;  Location: [facility] CATH LAB;  Service: Cardiovascular;  Laterality: N/A;  . Coronary angioplasty with stent placement  10/09/2001    PTCA & stenting of mid AV circumflex; 2.5x86mm Pixel stent  . Coronary angioplasty with stent placement  12/13/2001    PCI with stent to mid L circumflex, 95% stenosis to 0% residual  . Coronary angioplasty with stent placement  10/10/2003    PCI to mid AV circumflex; LAD 30% disease; RCA 100% occluded prox.  . Coronary angioplasty with stent placement  09/01/2011    PCI with stenting with bare metal stent to mid AV groove circumflex and PDA  . Coronary angioplasty with stent placement  10/17/2011    cutting balloon angioplasty of ostial lateral OM1 branch and bifurcation AV groove circumflex OM junction; stenosis reduced to 0%  . Coronary angioplasty  09/25/2015    mid cir & om  . Cardiac catheterization N/A 09/25/2015    Procedure: Left Heart Cath and Coronary Angiography;  Surgeon: Marykay Lex, MD;  Location: [facility] INVASIVE CV LAB;  Service: Cardiovascular;  Laterality: N/A;    Social History  Social History  . Marital Status: Single    Spouse Name: N/A  . Number of Children: N/A  . Years of Education: N/A   Occupational History  . Not on file.   Social History Main Topics  . Smoking status: Former Smoker -- 0.25 packs/day for 25 years    Types: Cigarettes    Quit date: 05/24/2015  . Smokeless tobacco: Never Used  . Alcohol Use: No  . Drug Use: No  . Sexual Activity: Yes   Other Topics Concern  . Not on file   Social History Narrative    Allergies  Allergen Reactions  . Iohexol     PT. TO BE PREMEDICATED PRIOR TO IV CONTRAST PER DR Eppie Gibson  /MMS//12/15/15Desc: PT BECAME SOB AND CHEST TIGHTNESS AFTER CONTRAST INJECTION.  [name],RT-RCT., Onset Date: [id number]   . Vicodin [Hydrocodone-Acetaminophen] Itching and Rash    Current Outpatient Prescriptions on File Prior to Visit  Medication Sig Dispense Refill  . acetaminophen (TYLENOL) 500 MG tablet Take 1,000 mg by mouth every 6 (six) hours as needed for headache.    Marland Kitchen aspirin 325 MG tablet Take 1 tablet (325 mg total) by mouth daily. 30 tablet 2  . clopidogrel (PLAVIX) 75 MG tablet Take 1 tablet (75 mg total) by mouth daily. 90 tablet 3  . nitroGLYCERIN (NITROSTAT) 0.4 MG SL tablet Place 1 tablet (0.4 mg total) under the tongue every 5 (five) minutes as needed for chest pain. 25 tablet 11  . ranolazine (RANEXA) 1000 MG SR tablet Take 1 tablet (1,000 mg total) by mouth 2 (two) times daily. 28 tablet 0  . HYDROcodone-acetaminophen (NORCO) 7.5-325 MG tablet Take 1 tablet by mouth as needed. Reported on 02/25/2016  0   No current facility-administered medications on file prior to visit.      Review of Systems  Constitutional: Negative for activity change and appetite change.  HENT: Negative for sinus pressure and sore throat.   Eyes: Negative for visual disturbance.  Respiratory: Negative for cough, chest tightness and shortness of breath.   Cardiovascular: Negative for chest pain and leg swelling.  Gastrointestinal: Negative for abdominal pain, diarrhea, constipation and abdominal distention.  Endocrine: Negative.   Genitourinary: Negative for dysuria.  Musculoskeletal: Positive for back pain. Negative for myalgias and joint swelling.  Skin: Negative for rash.  Allergic/Immunologic: Negative.   Neurological: Positive for weakness and numbness. Negative for light-headedness.  Psychiatric/Behavioral: Negative for suicidal ideas and dysphoric mood.       Objective: Filed Vitals:   02/25/16 0949  BP: 150/90  Pulse: 91  Temp: 98.6 F (37 C)  Resp: 15  Height: 5\' 8"   (1.727 m)  Weight: 264 lb 6.4 oz (119.931 kg)  SpO2: 97%      Physical Exam  Constitutional: He is oriented to person, place, and time. He appears well-developed and well-nourished.  Cardiovascular: Normal rate, normal heart sounds and intact distal pulses.   No murmur heard. Pulmonary/Chest: Effort normal and breath sounds normal. He has no wheezes. He has no rales. He exhibits no tenderness.  Abdominal: Soft. Bowel sounds are normal. He exhibits no distension and no mass. There is no tenderness.  Musculoskeletal: He exhibits tenderness (tenderness on palpation of the lumbar spine; positive straight leg raise bilaterally   ).  Reduced range of motion of lumbar spine.  Neurological: He is alert and oriented to person, place, and time.          Lab Results  Component Value Date   HGBA1C 6.1  02/25/2016    Assessment & Plan:  Coronary artery disease : No angina at this time Aggressive risk factor modification including smoking cessation. Continue Ranexa, Plavix   Hypertension: Uncontrolled. Increased dose of Imdur from 30 mg twice a day to 60 mg twice a day Lifestyle modification, low sodium, DASH diet Fasting labs ordered.  Diabetes mellitus: Controlled with A1c of 6.1. Continue metformin. 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 Placed on Tylenol 3. Referred to a spine surgeon as per patient request.  GERD: Controlled on Nexium   This note has been created with Education officer, environmental. Any transcriptional errors are unintentional.

## 2016-02-27 ENCOUNTER — Ambulatory Visit (HOSPITAL_COMMUNITY): Payer: Self-pay

## 2016-03-01 ENCOUNTER — Ambulatory Visit (HOSPITAL_COMMUNITY): Payer: Self-pay

## 2016-03-01 ENCOUNTER — Other Ambulatory Visit: Payer: BC Managed Care – PPO

## 2016-03-03 ENCOUNTER — Ambulatory Visit (HOSPITAL_COMMUNITY): Payer: Self-pay

## 2016-03-03 ENCOUNTER — Telehealth: Payer: Self-pay | Admitting: Internal Medicine

## 2016-03-03 MED ORDER — RANOLAZINE ER 1000 MG PO TB12: 1000.0000 mg | ORAL_TABLET | Freq: Two times a day (BID) | ORAL | Status: DC

## 2016-03-03 NOTE — Telephone Encounter (Signed)
Patient notified no samples are available. Offered to send in Rx - patient requested Rx be sent to [facility] outpatient pharmacy.  Rx(s) sent to pharmacy electronically.

## 2016-03-03 NOTE — Telephone Encounter (Signed)
-----   Message from [place] sent at 03/02/2016  4:43 PM EDT ----- Regarding: Samples Patient called for Ranexa samples.

## 2016-03-05 ENCOUNTER — Ambulatory Visit (HOSPITAL_COMMUNITY): Payer: Self-pay

## 2016-03-17 ENCOUNTER — Ambulatory Visit: Payer: Self-pay | Admitting: Family Medicine

## 2016-03-22 ENCOUNTER — Encounter (HOSPITAL_COMMUNITY): Payer: Self-pay | Admitting: *Deleted

## 2016-03-22 ENCOUNTER — Observation Stay (HOSPITAL_COMMUNITY)
Admission: EM | Admit: 2016-03-22 | Discharge: 2016-03-23 | Disposition: A | Discharge status: Home / Self Care | Payer: BC Managed Care – PPO | Attending: Internal Medicine | Admitting: Internal Medicine

## 2016-03-22 ENCOUNTER — Telehealth: Payer: Self-pay | Admitting: Internal Medicine

## 2016-03-22 ENCOUNTER — Emergency Department (HOSPITAL_COMMUNITY): Payer: BC Managed Care – PPO

## 2016-03-22 DIAGNOSIS — I2510 Atherosclerotic heart disease of native coronary artery without angina pectoris: Secondary | ICD-10-CM | POA: Diagnosis not present

## 2016-03-22 DIAGNOSIS — F329 Major depressive disorder, single episode, unspecified: Secondary | ICD-10-CM | POA: Insufficient documentation

## 2016-03-22 DIAGNOSIS — R079 Chest pain, unspecified: Principal | ICD-10-CM | POA: Insufficient documentation

## 2016-03-22 DIAGNOSIS — E119 Type 2 diabetes mellitus without complications: Secondary | ICD-10-CM

## 2016-03-22 DIAGNOSIS — R0789 Other chest pain: Secondary | ICD-10-CM | POA: Diagnosis not present

## 2016-03-22 DIAGNOSIS — Z9861 Coronary angioplasty status: Secondary | ICD-10-CM

## 2016-03-22 DIAGNOSIS — E785 Hyperlipidemia, unspecified: Secondary | ICD-10-CM | POA: Diagnosis present

## 2016-03-22 DIAGNOSIS — M5116 Intervertebral disc disorders with radiculopathy, lumbar region: Secondary | ICD-10-CM | POA: Diagnosis present

## 2016-03-22 DIAGNOSIS — R42 Dizziness and giddiness: Secondary | ICD-10-CM | POA: Diagnosis not present

## 2016-03-22 DIAGNOSIS — Z7951 Long term (current) use of inhaled steroids: Secondary | ICD-10-CM | POA: Insufficient documentation

## 2016-03-22 DIAGNOSIS — Z79899 Other long term (current) drug therapy: Secondary | ICD-10-CM | POA: Diagnosis not present

## 2016-03-22 DIAGNOSIS — I5189 Other ill-defined heart diseases: Secondary | ICD-10-CM | POA: Diagnosis present

## 2016-03-22 DIAGNOSIS — G609 Hereditary and idiopathic neuropathy, unspecified: Secondary | ICD-10-CM | POA: Diagnosis present

## 2016-03-22 DIAGNOSIS — Z87891 Personal history of nicotine dependence: Secondary | ICD-10-CM | POA: Insufficient documentation

## 2016-03-22 DIAGNOSIS — G8929 Other chronic pain: Secondary | ICD-10-CM | POA: Diagnosis not present

## 2016-03-22 DIAGNOSIS — Z7982 Long term (current) use of aspirin: Secondary | ICD-10-CM | POA: Insufficient documentation

## 2016-03-22 DIAGNOSIS — J45909 Unspecified asthma, uncomplicated: Secondary | ICD-10-CM | POA: Diagnosis present

## 2016-03-22 DIAGNOSIS — R110 Nausea: Secondary | ICD-10-CM | POA: Diagnosis not present

## 2016-03-22 DIAGNOSIS — F32A Depression, unspecified: Secondary | ICD-10-CM | POA: Diagnosis present

## 2016-03-22 DIAGNOSIS — M5010 Cervical disc disorder with radiculopathy, unspecified cervical region: Secondary | ICD-10-CM | POA: Diagnosis present

## 2016-03-22 DIAGNOSIS — I10 Essential (primary) hypertension: Secondary | ICD-10-CM | POA: Diagnosis present

## 2016-03-22 DIAGNOSIS — F419 Anxiety disorder, unspecified: Secondary | ICD-10-CM | POA: Diagnosis present

## 2016-03-22 DIAGNOSIS — K219 Gastro-esophageal reflux disease without esophagitis: Secondary | ICD-10-CM | POA: Diagnosis not present

## 2016-03-22 HISTORY — DX: Atherosclerotic heart disease of native coronary artery without angina pectoris: I25.10

## 2016-03-22 HISTORY — DX: Hyperlipidemia, unspecified: E78.5

## 2016-03-22 LAB — CBC
HCT: 42.9 % (ref 39.0–52.0)
HCT: 42.1 % (ref 39.0–52.0)
HEMOGLOBIN: 14.4 g/dL (ref 13.0–17.0)
Hemoglobin: 13.7 g/dL (ref 13.0–17.0)
MCH: 27.5 pg (ref 26.0–34.0)
MCH: 26.1 pg (ref 26.0–34.0)
MCHC: 33.6 g/dL (ref 30.0–36.0)
MCHC: 32.5 g/dL (ref 30.0–36.0)
MCV: 81.9 fL (ref 78.0–100.0)
MCV: 80.3 fL (ref 78.0–100.0)
Platelets: 253 10*3/uL (ref 150–400)
Platelets: 244 10*3/uL (ref 150–400)
RBC: 5.24 MIL/uL (ref 4.22–5.81)
RBC: 5.24 MIL/uL (ref 4.22–5.81)
RDW: 12.9 % (ref 11.5–15.5)
RDW: 13.1 % (ref 11.5–15.5)
WBC: 7.9 10*3/uL (ref 4.0–10.5)
WBC: 7.3 10*3/uL (ref 4.0–10.5)

## 2016-03-22 LAB — I-STAT TROPONIN, ED
TROPONIN I, POC: 0 ng/mL (ref 0.00–0.08)
TROPONIN I, POC: 0 ng/mL (ref 0.00–0.08)

## 2016-03-22 LAB — BASIC METABOLIC PANEL
ANION GAP: 10 (ref 5–15)
BUN: 10 mg/dL (ref 6–20)
CHLORIDE: 106 mmol/L (ref 101–111)
CO2: 28 mmol/L (ref 22–32)
Calcium: 8.9 mg/dL (ref 8.9–10.3)
Creatinine, Ser: 1.14 mg/dL (ref 0.61–1.24)
GFR calc non Af Amer: >60 mL/min (ref >60)
Glucose, Bld: 111 mg/dL — ABNORMAL HIGH (ref 65–99)
Potassium: 3.7 mmol/L (ref 3.5–5.1)
SODIUM: 144 mmol/L (ref 135–145)

## 2016-03-22 LAB — CREATININE, SERUM
Creatinine, Ser: 1.13 mg/dL (ref 0.61–1.24)
GFR calc Af Amer: >60 mL/min (ref >60)
GFR calc non Af Amer: >60 mL/min (ref >60)

## 2016-03-22 LAB — TROPONIN I: Troponin I: <0.03 ng/mL (ref <0.031)

## 2016-03-22 MED ORDER — TRAMADOL HCL 50 MG PO TABS
50.0000 mg | ORAL_TABLET | Freq: Once | ORAL | Status: AC
  Administered 2016-03-22: 50 mg via ORAL
  Filled 2016-03-22: qty 1

## 2016-03-22 MED ORDER — SODIUM CHLORIDE 0.9% FLUSH: 3.0000 mL | INTRAVENOUS | Status: DC | PRN

## 2016-03-22 MED ORDER — ALPRAZOLAM 0.25 MG PO TABS: 0.2500 mg | ORAL_TABLET | Freq: Two times a day (BID) | ORAL | Status: DC | PRN

## 2016-03-22 MED ORDER — SIMVASTATIN 20 MG PO TABS
20.0000 mg | ORAL_TABLET | Freq: Every day | ORAL | Status: DC
  Administered 2016-03-22: 20 mg via ORAL
  Filled 2016-03-22: qty 1

## 2016-03-22 MED ORDER — INSULIN ASPART 100 UNIT/ML ~~LOC~~ SOLN: 0.0000 [IU] | Freq: Every day | SUBCUTANEOUS | Status: DC

## 2016-03-22 MED ORDER — BUDESONIDE 0.25 MG/2ML IN SUSP
0.2500 mg | Freq: Two times a day (BID) | RESPIRATORY_TRACT | Status: DC
  Administered 2016-03-23: 0.25 mg via RESPIRATORY_TRACT
  Filled 2016-03-22: qty 2

## 2016-03-22 MED ORDER — ALBUTEROL SULFATE 108 (90 BASE) MCG/ACT IN AEPB: 1.0000 | INHALATION_SPRAY | Freq: Four times a day (QID) | RESPIRATORY_TRACT | Status: DC | PRN

## 2016-03-22 MED ORDER — INSULIN ASPART 100 UNIT/ML ~~LOC~~ SOLN
0.0000 [IU] | Freq: Three times a day (TID) | SUBCUTANEOUS | Status: DC
  Administered 2016-03-23: 2 [IU] via SUBCUTANEOUS

## 2016-03-22 MED ORDER — PANTOPRAZOLE SODIUM 40 MG PO TBEC
40.0000 mg | DELAYED_RELEASE_TABLET | Freq: Every day | ORAL | Status: DC
  Administered 2016-03-23: 40 mg via ORAL
  Filled 2016-03-22: qty 1

## 2016-03-22 MED ORDER — ZOLPIDEM TARTRATE 5 MG PO TABS: 5.0000 mg | ORAL_TABLET | Freq: Every evening | ORAL | Status: DC | PRN

## 2016-03-22 MED ORDER — SODIUM CHLORIDE 0.9 % IV SOLN: 250.0000 mL | INTRAVENOUS | Status: DC | PRN

## 2016-03-22 MED ORDER — VALSARTAN-HYDROCHLOROTHIAZIDE 320-25 MG PO TABS: 1.0000 | ORAL_TABLET | Freq: Every day | ORAL | Status: DC

## 2016-03-22 MED ORDER — CLOPIDOGREL BISULFATE 75 MG PO TABS
75.0000 mg | ORAL_TABLET | Freq: Every day | ORAL | Status: DC
  Administered 2016-03-23: 75 mg via ORAL
  Filled 2016-03-22: qty 1

## 2016-03-22 MED ORDER — SODIUM CHLORIDE 0.9% FLUSH
3.0000 mL | Freq: Two times a day (BID) | INTRAVENOUS | Status: DC
  Administered 2016-03-22: 3 mL via INTRAVENOUS

## 2016-03-22 MED ORDER — IRBESARTAN 150 MG PO TABS
300.0000 mg | ORAL_TABLET | Freq: Every day | ORAL | Status: DC
  Administered 2016-03-23: 300 mg via ORAL
  Filled 2016-03-22: qty 4

## 2016-03-22 MED ORDER — ONDANSETRON HCL 4 MG/2ML IJ SOLN: 4.0000 mg | Freq: Four times a day (QID) | INTRAMUSCULAR | Status: DC | PRN

## 2016-03-22 MED ORDER — ASPIRIN EC 81 MG PO TBEC
81.0000 mg | DELAYED_RELEASE_TABLET | Freq: Every day | ORAL | Status: DC
  Administered 2016-03-23: 81 mg via ORAL
  Filled 2016-03-22: qty 1

## 2016-03-22 MED ORDER — METOPROLOL TARTRATE 100 MG PO TABS
100.0000 mg | ORAL_TABLET | Freq: Two times a day (BID) | ORAL | Status: DC
  Administered 2016-03-22 – 2016-03-23 (×2): 100 mg via ORAL
  Filled 2016-03-22 (×2): qty 1

## 2016-03-22 MED ORDER — RANOLAZINE ER 500 MG PO TB12
1000.0000 mg | ORAL_TABLET | Freq: Two times a day (BID) | ORAL | Status: DC
  Administered 2016-03-22 – 2016-03-23 (×2): 1000 mg via ORAL
  Filled 2016-03-22 (×2): qty 2

## 2016-03-22 MED ORDER — NITROGLYCERIN 0.4 MG SL SUBL: 0.4000 mg | SUBLINGUAL_TABLET | SUBLINGUAL | Status: DC | PRN

## 2016-03-22 MED ORDER — ISOSORBIDE MONONITRATE ER 60 MG PO TB24
60.0000 mg | ORAL_TABLET | Freq: Two times a day (BID) | ORAL | Status: DC
Start: 2016-03-22 — End: 2016-03-23
  Administered 2016-03-22 – 2016-03-23 (×2): 60 mg via ORAL
  Filled 2016-03-22 (×2): qty 1

## 2016-03-22 MED ORDER — ASPIRIN 81 MG PO CHEW
324.0000 mg | CHEWABLE_TABLET | Freq: Once | ORAL | Status: AC
  Administered 2016-03-22: 243 mg via ORAL
  Filled 2016-03-22: qty 4

## 2016-03-22 MED ORDER — ACETAMINOPHEN 325 MG PO TABS
650.0000 mg | ORAL_TABLET | ORAL | Status: DC | PRN
  Administered 2016-03-23: 650 mg via ORAL
  Filled 2016-03-22: qty 2

## 2016-03-22 MED ORDER — KETOROLAC TROMETHAMINE 30 MG/ML IJ SOLN
30.0000 mg | Freq: Once | INTRAMUSCULAR | Status: AC
  Administered 2016-03-22: 30 mg via INTRAVENOUS
  Filled 2016-03-22: qty 1

## 2016-03-22 MED ORDER — ALBUTEROL SULFATE (2.5 MG/3ML) 0.083% IN NEBU: 2.5000 mg | INHALATION_SOLUTION | Freq: Four times a day (QID) | RESPIRATORY_TRACT | Status: DC | PRN

## 2016-03-22 MED ORDER — MOMETASONE FUROATE 220 MCG/INH IN AEPB: 2.0000 | INHALATION_SPRAY | Freq: Every day | RESPIRATORY_TRACT | Status: DC

## 2016-03-22 MED ORDER — HYDROCHLOROTHIAZIDE 25 MG PO TABS
25.0000 mg | ORAL_TABLET | Freq: Every day | ORAL | Status: DC
  Administered 2016-03-23: 25 mg via ORAL
  Filled 2016-03-22: qty 1

## 2016-03-22 MED ORDER — ENOXAPARIN SODIUM 40 MG/0.4ML ~~LOC~~ SOLN
40.0000 mg | SUBCUTANEOUS | Status: DC
  Administered 2016-03-22: 40 mg via SUBCUTANEOUS
  Filled 2016-03-22: qty 0.4

## 2016-03-22 NOTE — ED Notes (Signed)
Pt given '243mg'$  aspirin b/c pt took an '81mg'$  aspirin this morning.

## 2016-03-22 NOTE — ED Provider Notes (Signed)
CSN: [id number]     Arrival date & time 03/22/16  1350 History   First MD Initiated Contact with Patient 03/22/16 1617     Chief Complaint  Patient presents with  . Chest Pain     (Consider location/radiation/quality/duration/timing/severity/associated sxs/prior Treatment) Patient is a 50 y.o. male presenting with chest pain. The history is provided by the patient and medical records. No language interpreter was used.  Chest Pain Associated symptoms: nausea, numbness and shortness of breath   Associated symptoms: no abdominal pain, no back pain, no cough, no fever, no palpitations and not vomiting    Andrew West is a 50 y.o. male  with a PMH of HTN, HLD, CAD, prior MI with stent placement, GERD who presents to the Emergency Department complaining of sharp, nonradiating left-sided chest pain which began yesterday afternoon. Associated symptoms include numbness and tingling of left upper extremity, lightheadedness, nausea, and intermittent shortness of breath. Patient denies emesis, abdominal pain, back pain, fever, cough, congestion. He took one nitroglycerin at approximately 9:00 this morning with mild relief. He states that the nitroglycerin gives him a very bad headache and he did not want to take any more because of this. No other medications taken PTA for symptoms.   Past Medical History  Diagnosis Date  . Myocardial infarction ([place])     2002; treated with stent to mid L circumflex  . Hypercholesteremia   . Coronary artery disease   . Diabetes mellitus     Takes Metformin  . Hypertension     Takes Lisinopril   . GERD (gastroesophageal reflux disease)     Takes Dexilant  . Rhabdomyolysis     h/o, r/t statins  . Chronic leg pain     bilateral  . Chronic back pain   . Depression    Past Surgical History  Procedure Laterality Date  . Colonoscopy w/ polypectomy    . Lumbar laminectomy/decompression microdiscectomy  03/31/2012    Procedure: LUMBAR LAMINECTOMY/DECOMPRESSION  MICRODISCECTOMY 1 LEVEL;  Surgeon: Charlie Pitter, MD;  Location: [facility] NEURO ORS;  Service: Neurosurgery;  Laterality: Left;  . Transthoracic echocardiogram  07/28/2011    EF 55-65%; LVH, grade 1 diastolic dysfunction;   . Left heart catheterization with coronary angiogram N/A 10/18/2011    Procedure: LEFT HEART CATHETERIZATION WITH CORONARY ANGIOGRAM;  Surgeon: Leonie Man, MD;  Location: [facility] CATH LAB;  Service: Cardiovascular;  Laterality: N/A;  . Coronary angioplasty with stent placement  10/09/2001    PTCA & stenting of mid AV circumflex; 2.5x28m Pixel stent  . Coronary angioplasty with stent placement  12/13/2001    PCI with stent to mid L circumflex, 95% stenosis to 0% residual  . Coronary angioplasty with stent placement  10/10/2003    PCI to mid AV circumflex; LAD 30% disease; RCA 100% occluded prox.  . Coronary angioplasty with stent placement  09/01/2011    PCI with stenting with bare metal stent to mid AV groove circumflex and PDA  . Coronary angioplasty with stent placement  10/17/2011    cutting balloon angioplasty of ostial lateral OM1 branch and bifurcation AV groove circumflex OM junction; stenosis reduced to 0%  . Coronary angioplasty  09/25/2015    mid cir & om  . Cardiac catheterization N/A 09/25/2015    Procedure: Left Heart Cath and Coronary Angiography;  Surgeon: DLeonie Man MD;  [facility];  Service: Cardiovascular;  Laterality: N/A;   Family History  Problem Relation Age of Onset  . Anesthesia  problems Neg Hx   . Hypotension Neg Hx   . Malignant hyperthermia Neg Hx   . Pseudochol deficiency Neg Hx   . Hypertension Mother   . Heart attack Father   . Diabetes Mother   . Heart disease Brother     x 3   . Heart attack Brother     deceased  . Hypertension Sister   . Diabetes Sister    Social History  Substance Use Topics  . Smoking status: Former Smoker -- 0.25 packs/day for 25 years    Types: Cigarettes    Quit date: 05/24/2015  .  Smokeless tobacco: Never Used  . Alcohol Use: No    Review of Systems  Constitutional: Negative for fever.  HENT: Negative for congestion.   Eyes: Negative for visual disturbance.  Respiratory: Positive for shortness of breath. Negative for cough.   Cardiovascular: Positive for chest pain. Negative for palpitations and leg swelling.  Gastrointestinal: Positive for nausea. Negative for vomiting and abdominal pain.  Genitourinary: Negative for dysuria.  Musculoskeletal: Negative for back pain and neck pain.  Skin: Negative for rash.  Neurological: Positive for light-headedness and numbness. Negative for syncope.      Allergies  Iohexol and Vicodin  Home Medications   Prior to Admission medications   Medication Sig Start Date End Date Taking? Authorizing Provider  acetaminophen (TYLENOL) 500 MG tablet Take 1,000 mg by mouth every 6 (six) hours as needed for headache.   Yes Historical Provider, MD  acetaminophen-codeine (TYLENOL #3) 300-30 MG tablet Take 1-2 tablets by mouth every 6 (six) hours as needed for moderate pain. 02/25/16  Yes Arnoldo Morale, MD  Albuterol Sulfate (PROAIR RESPICLICK) 354 (90 Base) MCG/ACT AEPB Inhale 1 puff into the lungs every 6 (six) hours as needed (shortness of breath). 02/25/16  Yes Arnoldo Morale, MD  aspirin 325 MG tablet Take 1 tablet (325 mg total) by mouth daily. 10/01/15  Yes Arnoldo Morale, MD  clopidogrel (PLAVIX) 75 MG tablet Take 1 tablet (75 mg total) by mouth daily. 01/19/16  Yes Pixie Casino, MD  furosemide (LASIX) 20 MG tablet Take 1 tablet (20 mg total) by mouth daily as needed for fluid or edema. 02/25/16  Yes Arnoldo Morale, MD  isosorbide mononitrate (IMDUR) 30 MG 24 hr tablet Take 2 tablets (60 mg total) by mouth 2 (two) times daily. 02/25/16  Yes Arnoldo Morale, MD  metFORMIN (GLUCOPHAGE) 1000 MG tablet Take 1 tablet (1,000 mg total) by mouth 2 (two) times daily with a meal. 02/25/16  Yes Arnoldo Morale, MD  metoprolol (LOPRESSOR) 100 MG tablet Take 1  tablet (100 mg total) by mouth 2 (two) times daily. 02/25/16  Yes Arnoldo Morale, MD  mometasone (ASMANEX) 220 MCG/INH inhaler Inhale 2 puffs into the lungs daily. 02/25/16  Yes Arnoldo Morale, MD  nitroGLYCERIN (NITROSTAT) 0.4 MG SL tablet Place 1 tablet (0.4 mg total) under the tongue every 5 (five) minutes as needed for chest pain. 09/26/15  Yes Eileen Stanford, PA-C  pantoprazole (PROTONIX) 40 MG tablet Take 1 tablet (40 mg total) by mouth daily. 02/25/16  Yes Arnoldo Morale, MD  ranolazine (RANEXA) 1000 MG SR tablet Take 1 tablet (1,000 mg total) by mouth 2 (two) times daily. 03/03/16  Yes Pixie Casino, MD  simvastatin (ZOCOR) 20 MG tablet Take 1 tablet (20 mg total) by mouth at bedtime. 02/25/16  Yes Arnoldo Morale, MD  valsartan-hydrochlorothiazide (DIOVAN HCT) 320-25 MG tablet Take 1 tablet by mouth daily. 02/25/16  Yes Enobong Amao,  MD   BP 119/87 mmHg  Pulse 68  Temp(Src) 98.4 F (36.9 C) (Oral)  Resp 21  Ht '5\' 8"'$  (1.727 m)  Wt 120.022 kg  BMI 40.24 kg/m2  SpO2 91% Physical Exam  Constitutional: He is oriented to person, place, and time. He appears well-developed and well-nourished.  Alert and in no acute distress  HENT:  Head: Normocephalic and atraumatic.  Cardiovascular: Normal rate, regular rhythm and normal heart sounds.  Exam reveals no gallop and no friction rub.   No murmur heard. Intact and equal distal pulses.   Pulmonary/Chest: Effort normal and breath sounds normal. No respiratory distress. He has no wheezes. He has no rales. He exhibits tenderness.    Abdominal: Soft. Bowel sounds are normal. He exhibits no distension and no mass. There is no tenderness. There is no rebound and no guarding.  Musculoskeletal: He exhibits no edema.  Neurological: He is alert and oriented to person, place, and time.  Skin: Skin is warm and dry.  Nursing note and vitals reviewed.   ED Course  Procedures (including critical care time) Labs Review Labs Reviewed  BASIC METABOLIC PANEL -  Abnormal; Notable for the following:    Glucose, Bld 111 (*)    All other components within normal limits  CBC  CBC  CREATININE, SERUM  TROPONIN I  TROPONIN I  I-STAT TROPOININ, ED  Randolm Idol, ED    Imaging Review Dg Chest 2 View  03/22/2016  CLINICAL DATA:  Left-sided chest pain and left arm pain. Shortness of breath. EXAM: CHEST  2 VIEW COMPARISON:  03/25/2015 and CT scan dated 07/03/2015 FINDINGS: The heart size and pulmonary vascularity are normal. Slight interstitial accentuation at the lung bases is felt to be due to a shallow inspiration. No acute infiltrate or effusion. No significant bone abnormality. IMPRESSION: No acute abnormality. Electronically Signed   By: Lorriane Shire M.D.   On: 03/22/2016 14:42   I have personally reviewed and evaluated these images and lab results as part of my medical decision-making.   EKG Interpretation   Date/Time:  Monday Mar 22 2016 13:58:12 EDT Ventricular Rate:  77 PR Interval:  178 QRS Duration: 110 QT Interval:  410 QTC Calculation: 463 R Axis:   77 Text Interpretation:  Normal sinus rhythm T wave abnormality, consider  inferior ischemia Abnormal ECG Since last tracing new inferior ST  depression Confirmed by Eulis Foster  MD, Vira Agar ([id number]) on 03/22/2016 6:02:05 PM      MDM   Final diagnoses:  None   Andrew West presents to ED for left sided chest pain since yesterday afternoon. Nitro taken this morning with mild relief of symptoms. Extensive cardiac history. EKG: ST depression inferiorly that was not present on prior EKGs. - ASA given Labs: Negative troponin 2, CBC and BMP reviewed and reassuring Imaging: Chest x-ray with no acute abnormalities.  6:23 PM - Consulted Cardiology, Dr. Stanford Breed, who will come see patient.   Patient to be admitted for further evaluation/observation.   Patient seen by and discussed with Dr. Eulis Foster who agrees with treatment plan.    [facility], PA-C 03/22/16 1951  Daleen Bo, MD 03/23/16 [phone]

## 2016-03-22 NOTE — ED Notes (Signed)
Cards at bedside

## 2016-03-22 NOTE — ED Provider Notes (Signed)
  Face-to-face evaluation   History: He is here for evaluation of chest pain which is somewhat different than his usual pain, now located left lateral lower chest, when usually his pain is central upper. He took one nitroglycerin today without resolution of the pain. The pain has continued to return for periods of 15-20 minutes and is sharp in nature, 8/10. He took his aspirin this morning. He takes nitroglycerin at least twice in a typical week. He had a cardiac catheterization with angioplasty and stenting earlier this year.  Physical exam: Alert man who appears comfortable. Heart regular rate and rhythm, no murmur. Lungs clear anteriorly.  Medical screening examination/treatment/procedure(s) were conducted as a shared visit with non-physician practitioner(s) and myself.  I personally evaluated the patient during the encounter  Daleen Bo, MD 03/23/16 1557

## 2016-03-22 NOTE — Telephone Encounter (Signed)
Pt c/o of Chest Pain: STAT if CP now or developed within 24 hours  1. Are you having CP right now? YES 2. Are you experiencing any other symptoms (ex. SOB, nausea, vomiting, sweating)? nausea 3. How long have you been experiencing CP? 03/21/16  4. Is your CP continuous or coming and going? coming and going  5. Have you taken Nitroglycerin? YES?

## 2016-03-22 NOTE — ED Notes (Signed)
Pt has cardiac history, having left side chest pains today with pain into left arm and reports sob.

## 2016-03-22 NOTE — H&P (Signed)
Patient ID: Andrew West MRN: [id number], DOB/AGE: December 13, 1965   Admit date: 03/22/2016   Primary Physician: Jaclyn Shaggy, MD Primary Cardiologist: Dr Rennis Golden  HPI: Unfortunate 50 y/o AA male with CAD, DM, neuropathy, and chronic pain issues. He also has CAD and has had multiple PCI's. His last cath results from Nov 2016 are listed below-                 Ost 2nd Mrg to 2nd Mrg lesion, 99% stenosed. Post intervention, 99% residual stenosis remained. The lesion was previously treated with a bare metal stentgreater than two years ago.  Mid Cx-2 lesion, 90% stenosed. Post intervention, there is a 20% residual stenosis.  Mid Cx-1 lesion, 80% stenosed. Post intervention, there is a 50% residual stenosis. The lesion was previously treated with a bare metal stent.  Ramus lesion, 100% stenosed. The lesion was previously treated with a bare metal stentgreater than two years ago.  Prox RCA lesion, 100% stenosed.  There is mild left ventricular systolic dysfunction.  Moderately elevated LVEDP of 20 mmHg   Difficult situation with what amounts to be a totally occluded (In-stent re-stenosis) of the OM2 Andrew West with sub-optimal PTCA of the mid AV-Groove In-stent restenosis.  He is followed by Dr Rennis Golden. Today he called the office complaining of chest pain and was told to go to the ED. He describes localized pain under his Lt breast, worse with palpation and deep breathing. He tells me he felt "faint" at times today. In ED his EKG is without acute changes, Troponin is negative x 2.                      Problem List: Past Medical History  Diagnosis Date  . Myocardial infarction (HCC)     2002; treated with stent to mid L circumflex  . Hypercholesteremia   . Coronary artery disease   . Diabetes mellitus     Takes Metformin  . Hypertension     Takes Lisinopril   . GERD (gastroesophageal reflux disease)     Takes Dexilant  . Rhabdomyolysis     h/o, r/t statins  . Chronic leg pain       bilateral  . Chronic back pain   . Depression     Past Surgical History  Procedure Laterality Date  . Colonoscopy w/ polypectomy    . Lumbar laminectomy/decompression microdiscectomy  03/31/2012    Procedure: LUMBAR LAMINECTOMY/DECOMPRESSION MICRODISCECTOMY 1 LEVEL;  Surgeon: Temple Pacini, MD;  Location: [facility];  Service: Neurosurgery;  Laterality: Left;  . Transthoracic echocardiogram  07/28/2011    EF 55-65%; LVH, grade 1 diastolic dysfunction;   . Left heart catheterization with coronary angiogram N/A 10/18/2011    Procedure: LEFT HEART CATHETERIZATION WITH CORONARY ANGIOGRAM;  Surgeon: Marykay Lex, MD;  Location: [facility] CATH LAB;  Service: Cardiovascular;  Laterality: N/A;  . Coronary angioplasty with stent placement  10/09/2001    PTCA & stenting of mid AV circumflex; 2.5x69mm Pixel stent  . Coronary angioplasty with stent placement  12/13/2001    PCI with stent to mid L circumflex, 95% stenosis to 0% residual  . Coronary angioplasty with stent placement  10/10/2003    PCI to mid AV circumflex; LAD 30% disease; RCA 100% occluded prox.  . Coronary angioplasty with stent placement  09/01/2011    PCI with stenting with bare metal stent to mid AV groove circumflex and PDA  . Coronary angioplasty with stent placement  10/17/2011    cutting balloon angioplasty of ostial lateral OM1 Andrew West and bifurcation AV groove circumflex OM junction; stenosis reduced to 0%  . Coronary angioplasty  09/25/2015    mid cir & om  . Cardiac catheterization N/A 09/25/2015    Procedure: Left Heart Cath and Coronary Angiography;  Surgeon: Marykay Lex, MD;  Location: [facility] INVASIVE CV LAB;  Service: Cardiovascular;  Laterality: N/A;     Allergies:  Allergies  Allergen Reactions  . Iohexol     PT. TO BE PREMEDICATED PRIOR TO IV CONTRAST PER DR Eppie Gibson /MMS//12/15/15Desc: PT BECAME SOB AND CHEST TIGHTNESS AFTER CONTRAST INJECTION.  [name],RT-RCT., Onset Date: [id number]   . Vicodin  [Hydrocodone-Acetaminophen] Itching and Rash     Home Medications Prior to Admission medications   Medication Sig Start Date End Date Taking? Authorizing Provider  acetaminophen (TYLENOL) 500 MG tablet Take 1,000 mg by mouth every 6 (six) hours as needed for headache.   Yes Historical Provider, MD  acetaminophen-codeine (TYLENOL #3) 300-30 MG tablet Take 1-2 tablets by mouth every 6 (six) hours as needed for moderate pain. 02/25/16  Yes Jaclyn Shaggy, MD  Albuterol Sulfate (PROAIR RESPICLICK) 108 (90 Base) MCG/ACT AEPB Inhale 1 puff into the lungs every 6 (six) hours as needed (shortness of breath). 02/25/16  Yes Jaclyn Shaggy, MD  aspirin 325 MG tablet Take 1 tablet (325 mg total) by mouth daily. 10/01/15  Yes Jaclyn Shaggy, MD  clopidogrel (PLAVIX) 75 MG tablet Take 1 tablet (75 mg total) by mouth daily. 01/19/16  Yes Chrystie Nose, MD  furosemide (LASIX) 20 MG tablet Take 1 tablet (20 mg total) by mouth daily as needed for fluid or edema. 02/25/16  Yes Jaclyn Shaggy, MD  isosorbide mononitrate (IMDUR) 30 MG 24 hr tablet Take 2 tablets (60 mg total) by mouth 2 (two) times daily. 02/25/16  Yes Jaclyn Shaggy, MD  metFORMIN (GLUCOPHAGE) 1000 MG tablet Take 1 tablet (1,000 mg total) by mouth 2 (two) times daily with a meal. 02/25/16  Yes Jaclyn Shaggy, MD  metoprolol (LOPRESSOR) 100 MG tablet Take 1 tablet (100 mg total) by mouth 2 (two) times daily. 02/25/16  Yes Jaclyn Shaggy, MD  mometasone (ASMANEX) 220 MCG/INH inhaler Inhale 2 puffs into the lungs daily. 02/25/16  Yes Jaclyn Shaggy, MD  nitroGLYCERIN (NITROSTAT) 0.4 MG SL tablet Place 1 tablet (0.4 mg total) under the tongue every 5 (five) minutes as needed for chest pain. 09/26/15  Yes Janetta Hora, PA-C  pantoprazole (PROTONIX) 40 MG tablet Take 1 tablet (40 mg total) by mouth daily. 02/25/16  Yes Jaclyn Shaggy, MD  ranolazine (RANEXA) 1000 MG SR tablet Take 1 tablet (1,000 mg total) by mouth 2 (two) times daily. 03/03/16  Yes Chrystie Nose, MD  simvastatin  (ZOCOR) 20 MG tablet Take 1 tablet (20 mg total) by mouth at bedtime. 02/25/16  Yes Jaclyn Shaggy, MD  valsartan-hydrochlorothiazide (DIOVAN HCT) 320-25 MG tablet Take 1 tablet by mouth daily. 02/25/16  Yes Jaclyn Shaggy, MD     Family History  Problem Relation Age of Onset  . Anesthesia problems Neg Hx   . Hypotension Neg Hx   . Malignant hyperthermia Neg Hx   . Pseudochol deficiency Neg Hx   . Hypertension Mother   . Heart attack Father   . Diabetes Mother   . Heart disease Brother     x 3   . Heart attack Brother     deceased  . Hypertension Sister   . Diabetes Sister  Social History   Social History  . Marital Status: Single    Spouse Name: N/A  . Number of Children: N/A  . Years of Education: N/A   Occupational History  . Not on file.   Social History Main Topics  . Smoking status: Former Smoker -- 0.25 packs/day for 25 years    Types: Cigarettes    Quit date: 05/24/2015  . Smokeless tobacco: Never Used  . Alcohol Use: No  . Drug Use: No  . Sexual Activity: Yes   Other Topics Concern  . Not on file   Social History Narrative     Review of Systems: General: negative for chills, fever, night sweats or weight changes.  Cardiovascular: negative for edema, orthopnea, palpitations, paroxysmal nocturnal dyspnea  HEENT: negative for any visual disturbances, blindness, glaucoma Dermatological: negative for rash Respiratory: negative for cough, hemoptysis, or wheezing Urologic: negative for hematuria or dysuria Abdominal: negative for nausea, vomiting, diarrhea, bright red blood per rectum, melena, or hematemesis Neurologic: negative for visual changes, syncope, or dizziness Musculoskeletal: negative for back pain, joint pain, or swelling Psych: cooperative and appropriate All other systems reviewed and are otherwise negative except as noted above.  Physical Exam: Blood pressure 119/87, pulse 68, temperature 98.4 F (36.9 C), temperature source Oral, resp.  rate 21, height 5\' 8"  (1.727 m), weight 264 lb 9.6 oz (120.022 kg), SpO2 91 %.  General appearance: alert, cooperative and no distress Neck: no carotid bruit and no JVD Lungs: clear to auscultation bilaterally and very tender under Lt breast Heart: regular rate and rhythm and no rub Abdomen: soft, non-tender; bowel sounds normal; no masses,  no organomegaly Extremities: extremities normal, atraumatic, no cyanosis or edema Pulses: 2+ and symmetric Skin: Skin color, texture, turgor normal. No rashes or lesions Neurologic: Grossly normal    Labs:   Results for orders placed or performed during the hospital encounter of 03/22/16 (from the past 24 hour(s))  Basic metabolic panel     Status: Abnormal   Collection Time: 03/22/16  2:06 PM  Result Value Ref Range   Sodium 144 135 - 145 mmol/L   Potassium 3.7 3.5 - 5.1 mmol/L   Chloride 106 101 - 111 mmol/L   CO2 28 22 - 32 mmol/L   Glucose, Bld 111 (H) 65 - 99 mg/dL   BUN 10 6 - 20 mg/dL   Creatinine, Ser 2.70 0.61 - 1.24 mg/dL   Calcium 8.9 8.9 - 62.3 mg/dL   GFR calc non Af Amer >60 >60 mL/min   GFR calc Af Amer >60 >60 mL/min   Anion gap 10 5 - 15  CBC     Status: None   Collection Time: 03/22/16  2:06 PM  Result Value Ref Range   WBC 7.9 4.0 - 10.5 K/uL   RBC 5.24 4.22 - 5.81 MIL/uL   Hemoglobin 14.4 13.0 - 17.0 g/dL   HCT 76.2 83.1 - 51.7 %   MCV 81.9 78.0 - 100.0 fL   MCH 27.5 26.0 - 34.0 pg   MCHC 33.6 30.0 - 36.0 g/dL   RDW 61.6 07.3 - 71.0 %   Platelets 253 150 - 400 K/uL  I-stat troponin, ED     Status: None   Collection Time: 03/22/16  2:21 PM  Result Value Ref Range   Troponin i, poc 0.00 0.00 - 0.08 ng/mL   Comment 3          I-stat troponin, ED     Status: None   Collection Time:  03/22/16  5:51 PM  Result Value Ref Range   Troponin i, poc 0.00 0.00 - 0.08 ng/mL   Comment 3             Radiology/Studies: Dg Chest 2 View  03/22/2016  CLINICAL DATA:  Left-sided chest pain and left arm pain. Shortness of breath.  EXAM: CHEST  2 VIEW COMPARISON:  03/25/2015 and CT scan dated 07/03/2015 FINDINGS: The heart size and pulmonary vascularity are normal. Slight interstitial accentuation at the lung bases is felt to be due to a shallow inspiration. No acute infiltrate or effusion. No significant bone abnormality. IMPRESSION: No acute abnormality. Electronically Signed   By: Francene Boyers M.D.   On: 03/22/2016 14:42    EKG:NSR, NSST changes  ASSESSMENT AND PLAN:  Principal Problem:   Chest pain Active Problems:   CAD S/P multiple PCI's   Diabetes mellitus (HCC)   Essential hypertension   Cervical disc disorder with radiculopathy of cervical region   Diastolic dysfunction-grade 2 with EF 60-65% Oct 2016   HLD (hyperlipidemia)   Depression   Hereditary and idiopathic peripheral neuropathy   Lumbar disc herniation with radiculopathy   Reactive airway disease   PLAN: Difficult situation- pt has had chronic chest pain and this doesn't sound typical for angina but he has known disease with suboptimal result with his last PCI.  Admit for observation, cycle Troponin. Try Toradol.    Deland Pretty, PA-C 03/22/2016, 7:13 PM [phone]  Patient seen and discussed with PA Diona Fanti, I agree with his documentation. 50 yo male history of CAD and prior stenting as described above, HL, DM2, HTN admitted with chest pain. From Dr Blanchie Dessert notes he has a history of chronic chest pain. Cath as described below, medically managed disease with OM2 not amenable to revasc attempt 09/2015.   Presents with atypical chest pain, started yesterday morning and aside from a 45 min period has been constant since that time. Worst with deep breathing. Can occur at rest or with exertion.  09/2015 cath:   Suezanne Jacquet 2nd Mrg to 2nd Mrg lesion, 99% stenosed. Post intervention, 99% residual stenosis remained. The lesion was previously treated with a bare metal stentgreater than two years ago.  Mid Cx-2 lesion, 90% stenosed. Post  intervention, there is a 20% residual stenosis.  Mid Cx-1 lesion, 80% stenosed. Post intervention, there is a 50% residual stenosis. The lesion was previously treated with a bare metal stent.  Ramus lesion, 100% stenosed. The lesion was previously treated with a bare metal stentgreater than two years ago.  Prox RCA lesion, 100% stenosed.  There is mild left ventricular systolic dysfunction.  Moderately elevated LVEDP of 20 mmHg   Hgb 14.4, Plt 253, WBC 7.9, K 3.7, Cr 1.14, trop neg x 2 CXR no acute process EKG inferior and lateral precoridal TWI and ST depression 08/2015 echo: LVEF 60-65%, no WMAs, grade II diastolic dysfunction. 04/2015 Nuclear stress inferolateral scar.    Very atypical chest pain in patient with known CAD with poor revasc options and chronic chest pain. Subtle ST/T changes on EKG, enzymes have been negative. Will admit for observation overnight to see if any objective evidence of ischemia, continue medical therapy at this time   Dominga Ferry MD

## 2016-03-22 NOTE — ED Notes (Signed)
Spoke with cardiology about pt having CP and needing step down bed, cards does not believe pain is cardiac related, pt does not c/o pain during palpation, pt to receive pain medication.

## 2016-03-22 NOTE — Telephone Encounter (Signed)
Thanks .. I agree.  Dr. Lemmie Evens

## 2016-03-22 NOTE — Telephone Encounter (Signed)
Pt notes nausea, near syncope, chest pain, and SOB this morning. Had the SOB since yesterday.  Notes he took nitro x1 this AM which relieved his chest pain to some degree. However, he does not want to take anymore d/t having headache. Notes the CP is coming and going. "feels like something is coming up in my chest".  He reports compliance w/ ranexa also. He is unsure what to do. Advised pt on ED evaluation. Called Trish, she is aware of pending arrival to [facility] from home.

## 2016-03-23 ENCOUNTER — Encounter (HOSPITAL_COMMUNITY): Payer: Self-pay | Admitting: Physician Assistant

## 2016-03-23 DIAGNOSIS — M940 Chondrocostal junction syndrome [Tietze]: Secondary | ICD-10-CM

## 2016-03-23 LAB — TROPONIN I: Troponin I: <0.03 ng/mL (ref <0.031)

## 2016-03-23 LAB — GLUCOSE, CAPILLARY
GLUCOSE-CAPILLARY: 96 mg/dL (ref 65–99)
GLUCOSE-CAPILLARY: 132 mg/dL — AB (ref 65–99)

## 2016-03-23 MED ORDER — TRAMADOL HCL 50 MG PO TABS
50.0000 mg | ORAL_TABLET | Freq: Once | ORAL | Status: AC
  Administered 2016-03-23: 50 mg via ORAL
  Filled 2016-03-23: qty 1

## 2016-03-23 MED ORDER — ASPIRIN 81 MG PO TABS: 81.0000 mg | ORAL_TABLET | Freq: Every day | ORAL | Status: DC

## 2016-03-23 NOTE — Progress Notes (Signed)
Patient Name: Andrew West Date of Encounter: 03/23/2016  Principal Problem:   Chest pain Active Problems:   Diabetes mellitus ([place])   HLD (hyperlipidemia)   Depression   Hereditary and idiopathic peripheral neuropathy   Essential hypertension   Cervical disc disorder with radiculopathy of cervical region   Lumbar disc herniation with radiculopathy   Reactive airway disease   CAD S/P multiple PCI's   Diastolic dysfunction-grade 2 with EF 60-65% Oct 2016   Primary Cardiologist: Dr. Debara Pickett Patient Profile: [name] is a 50 year old male with a past medical history of CAD (last cath 09/2015), DM, neuropathy and chronic pain. Presented to ED on 03/22/16 with chest pain, SOB, nausea. Troponin negative x2.   SUBJECTIVE: Feels better today, no chest pain. No SOB.   OBJECTIVE Filed Vitals:   03/22/16 2030 03/22/16 2117 03/23/16 0423 03/23/16 0748  BP: 115/80 134/96 104/73   Pulse: 65 68 65   Temp:  98 F (36.7 C) 97.7 F (36.5 C)   TempSrc:  Oral Oral   Resp: '20 18 16   '$ Height:  '5\' 8"'$  (1.727 m)    Weight:  262 lb 3.2 oz (118.933 kg)    SpO2: 97% 98% 90% 90%   No intake or output data in the 24 hours ending 03/23/16 0921 Filed Weights   03/22/16 1403 03/22/16 2117  Weight: 264 lb 9.6 oz (120.022 kg) 262 lb 3.2 oz (118.933 kg)    PHYSICAL EXAM General: Well developed, well nourished, male in no acute distress. Head: Normocephalic, atraumatic.  Neck: Supple without bruits, No JVD. Lungs:  Resp regular and unlabored, CTA. Heart: RRR, S1, S2, no S3, S4, or murmur; no rub. Abdomen: Soft, non-tender, non-distended, BS + x 4.  Extremities: No clubbing, cyanosis, No edema.  Neuro: Alert and oriented X 3. Moves all extremities spontaneously. Psych: Normal affect.  LABS: CBC: Recent Labs  03/22/16 1406 03/22/16 2046  WBC 7.9 7.3  HGB 14.4 13.7  HCT 42.9 42.1  MCV 81.9 80.3  PLT 253 638   Basic Metabolic Panel: Recent Labs  03/22/16 1406 03/22/16 2046  NA  144  --   K 3.7  --   CL 106  --   CO2 28  --   GLUCOSE 111*  --   BUN 10  --   CREATININE 1.14 1.13  CALCIUM 8.9  --    Cardiac Enzymes: Recent Labs  03/22/16 2046 03/23/16 0123  TROPONINI <0.03 <0.03    Recent Labs  03/22/16 1421 03/22/16 1751  TROPIPOC 0.00 0.00     Current facility-administered medications:  .  0.9 %  sodium chloride infusion, 250 mL, Intravenous, PRN, Erlene Quan, PA-C .  acetaminophen (TYLENOL) tablet 650 mg, 650 mg, Oral, Q4H PRN, Erlene Quan, PA-C .  albuterol (PROVENTIL) (2.5 MG/3ML) 0.083% nebulizer solution 2.5 mg, 2.5 mg, Nebulization, Q6H PRN, Jake Church Masters, RPH .  ALPRAZolam Duanne Moron) tablet 0.25 mg, 0.25 mg, Oral, BID PRN, Doreene Burke Kilroy, PA-C .  aspirin EC tablet 81 mg, 81 mg, Oral, Daily, Luke K Kilroy, PA-C .  budesonide (PULMICORT) nebulizer solution 0.25 mg, 0.25 mg, Nebulization, BID, Jake Church Masters, RPH, 0.25 mg at 03/23/16 0746 .  clopidogrel (PLAVIX) tablet 75 mg, 75 mg, Oral, Daily, Luke K Kilroy, PA-C .  enoxaparin (LOVENOX) injection 40 mg, 40 mg, Subcutaneous, Q24H, Doreene Burke [place], PA-C, 40 mg at 03/22/16 2204 .  irbesartan (AVAPRO) tablet 300 mg, 300 mg, Oral, Daily **AND** hydrochlorothiazide (  HYDRODIURIL) tablet 25 mg, 25 mg, Oral, Daily, Jake Church Masters, [facility] .  insulin aspart (novoLOG) injection 0-15 Units, 0-15 Units, Subcutaneous, TID WC, Luke K Kilroy, PA-C .  insulin aspart (novoLOG) injection 0-5 Units, 0-5 Units, Subcutaneous, QHS, [employer], PA-C, 0 Units at 03/22/16 2200 .  isosorbide mononitrate (IMDUR) 24 hr tablet 60 mg, 60 mg, Oral, BID, Erlene Quan, PA-C, 60 mg at 03/22/16 2205 .  metoprolol tartrate (LOPRESSOR) tablet 100 mg, 100 mg, Oral, BID, Doreene Burke Kilroy, PA-C, 100 mg at 03/22/16 2205 .  nitroGLYCERIN (NITROSTAT) SL tablet 0.4 mg, 0.4 mg, Sublingual, Q5 Min x 3 PRN, Luke K Kilroy, PA-C .  ondansetron [facility]) injection 4 mg, 4 mg, Intravenous, Q6H PRN, Erlene Quan, PA-C .  pantoprazole (PROTONIX)  EC tablet 40 mg, 40 mg, Oral, Daily, Luke K Kilroy, PA-C .  ranolazine (RANEXA) 12 hr tablet 1,000 mg, 1,000 mg, Oral, BID, Doreene Burke Kilroy, PA-C, 1,000 mg at 03/22/16 2205 .  simvastatin (ZOCOR) tablet 20 mg, 20 mg, Oral, QHS, Doreene Burke [place], PA-C, 20 mg at 03/22/16 2205 .  sodium chloride flush (NS) 0.9 % injection 3 mL, 3 mL, Intravenous, Q12H, [employer], PA-C, 3 mL at 03/22/16 2200 .  sodium chloride flush (NS) 0.9 % injection 3 mL, 3 mL, Intravenous, PRN, Doreene Burke Kilroy, PA-C .  zolpidem (AMBIEN) tablet 5 mg, 5 mg, Oral, QHS PRN,MR X 1, Luke K [place], [place]    TELE: NSR       ECG: NSR   Echo: 08/26/15 - Procedure narrative: Transthoracic echocardiography. Image  quality was suboptimal. The study was technically difficult.  Intravenous contrast (Definity) was administered. - Left ventricle: The cavity size was normal. There was mild focal  basal hypertrophy of the septum. Systolic function was normal.  The estimated ejection fraction was in the range of 60% to 65%.  Wall motion was normal; there were no regional wall motion  abnormalities. Features are consistent with a pseudonormal left  ventricular filling pattern, with concomitant abnormal relaxation  and increased filling pressure (grade 2 diastolic dysfunction).  Doppler parameters are consistent with high ventricular filling  pressure. - Mitral valve: There was trivial regurgitation. - Left atrium: The atrium was mildly dilated. - Pulmonic valve: There was trivial regurgitation. - Pulmonary arteries: PA peak pressure: 31 mm Hg (S).   Radiology/Studies: Dg Chest 2 View  03/22/2016  CLINICAL DATA:  Left-sided chest pain and left arm pain. Shortness of breath. EXAM: CHEST  2 VIEW COMPARISON:  03/25/2015 and CT scan dated 07/03/2015 FINDINGS: The heart size and pulmonary vascularity are normal. Slight interstitial accentuation at the lung bases is felt to be due to a shallow inspiration. No acute infiltrate or effusion.  No significant bone abnormality. IMPRESSION: No acute abnormality. Electronically Signed   By: Lorriane Shire M.D.   On: 03/22/2016 14:42   Left Heart Cath and Coronary Angiography 09/25/15  Ost 2nd Mrg to 2nd Mrg lesion, 99% stenosed. Post intervention, 99% residual stenosis remained. The lesion was previously treated with a bare metal stentgreater than two years ago.  Mid Cx-2 lesion, 90% stenosed. Post intervention, there is a 20% residual stenosis.  Mid Cx-1 lesion, 80% stenosed. Post intervention, there is a 50% residual stenosis. The lesion was previously treated with a bare metal stent.  Ramus lesion, 100% stenosed. The lesion was previously treated with a bare metal stentgreater than two years ago.  Prox RCA lesion, 100% stenosed.  There is mild left ventricular  systolic dysfunction.  Moderately elevated LVEDP of 20 mmHg    Current Medications:  . aspirin EC  81 mg Oral Daily  . budesonide  0.25 mg Nebulization BID  . clopidogrel  75 mg Oral Daily  . enoxaparin (LOVENOX) injection  40 mg Subcutaneous Q24H  . irbesartan  300 mg Oral Daily   And  . hydrochlorothiazide  25 mg Oral Daily  . insulin aspart  0-15 Units Subcutaneous TID WC  . insulin aspart  0-5 Units Subcutaneous QHS  . isosorbide mononitrate  60 mg Oral BID  . metoprolol  100 mg Oral BID  . pantoprazole  40 mg Oral Daily  . ranolazine  1,000 mg Oral BID  . simvastatin  20 mg Oral QHS  . sodium chloride flush  3 mL Intravenous Q12H      ASSESSMENT AND PLAN: Principal Problem:   Chest pain Active Problems:   Diabetes mellitus (HCC)   HLD (hyperlipidemia)   Depression   Hereditary and idiopathic peripheral neuropathy   Essential hypertension   Cervical disc disorder with radiculopathy of cervical region   Lumbar disc herniation with radiculopathy   Reactive airway disease   CAD S/P multiple PCI's   Diastolic dysfunction-grade 2 with EF 60-65% Oct 2016  1, Atypical chest pain: Patient presented  with chest pain that does not sound typical for angina. He says his pain was located under his left breast, reproducible with palpation.  He has had MI in the past, and his anginal pain is very different from the pain he came in with.    Review of clinic notes shows that patient has chronic chest pain. His pain worsened with breathing. His EKG is not concerning for ischemia. Troponin negative. Last cath was 7 months ago (report above). There is in stent re stenosis of OM2 branch. He is on max dose Ranexa. Can increase his Imdur to '60mg'$  BID, however this is most likely musculoskeletal. His SBP is in 110's-120's. He was given IV Tordal last night for symptoms with relief.   Last echo showed EF of 60-65%, with grade 2 DD. Continue beta blocker.   2. HLD: On moderate dose statin, need lipids checked outpatient.   3. History of CAD: patient with PCI to Circumflex in Nov. 2016. He is on Plavix and ASA.    Signed, Arbutus Leas , NP 9:21 AM 03/23/2016 Pager [phone]  Patient seen, examined. Available data reviewed. Agree with findings, assessment, and plan as outlined by Jettie Booze, NP. Exam reveals a pleasant, alert and oriented male in no distress. Lungs are clear. JVP is normal. Heart is regular rate and rhythm without murmur or gallop. There is no peripheral edema. The patient has exquisite chest wall tenderness under the left breast. There is no associated deformity.Lab work, electrocardiogram, and radiographic data reviewed. The patient has musculoskeletal chest pain, possibly costochondritis. This clearly does not seem cardiac related. Recommend a short course of nonsteroidal anti-inflammatory medication. Ibuprofen 600 mg TID x 7 days. Continue PPI with protonix 40 mg daily.   Sherren Mocha, M.D. 03/23/2016 10:27 AM

## 2016-03-23 NOTE — Discharge Summary (Signed)
Discharge Summary    Patient ID: Andrew West,  MRN: [id number], DOB/AGE: [birth year] 50 y.o.  Admit date: 03/22/2016 Discharge date: 03/23/2016  Primary Care Provider: Jaclyn Shaggy Primary Cardiologist: Dr. Rennis Golden    Discharge Diagnoses    Principal Problem:   Chest pain Active Problems:   Diabetes mellitus (HCC)   HLD (hyperlipidemia)   Depression   Hereditary and idiopathic peripheral neuropathy   Essential hypertension   Cervical disc disorder with radiculopathy of cervical region   Lumbar disc herniation with radiculopathy   Reactive airway disease   CAD S/P multiple PCI's   Diastolic dysfunction-grade 2 with EF 60-65% Oct 2016   Allergies Allergies  Allergen Reactions  . Iohexol     PT. TO BE PREMEDICATED PRIOR TO IV CONTRAST PER DR Eppie Gibson /MMS//12/15/15Desc: PT BECAME SOB AND CHEST TIGHTNESS AFTER CONTRAST INJECTION.  [name],RT-RCT., Onset Date: [id number]   . Vicodin [Hydrocodone-Acetaminophen] Itching and Rash     History of Present Illness     Andrew West is a 50 y.o. male with a history of CAD s/p multiple previous PCIs, HLD,  DM, COPD, neuropathy, and chronic pain issues. He also has CAD and has had multiple PCI's.   His last cath results from Nov 2016:     Ost 2nd Mrg to 2nd Mrg lesion, 99% stenosed. Post intervention, 99% residual stenosis remained. The lesion was previously treated with a bare metal stentgreater than two years ago.  Mid Cx-2 lesion, 90% stenosed. Post intervention, there is a 20% residual stenosis.  Mid Cx-1 lesion, 80% stenosed. Post intervention, there is a 50% residual stenosis. The lesion was previously treated with a bare metal stent.  Ramus lesion, 100% stenosed. The lesion was previously treated with a bare metal stentgreater than two years ago.  Prox RCA lesion, 100% stenosed.  There is mild left ventricular systolic dysfunction.  Moderately elevated LVEDP of 20 mmHg   Difficult situation  with what amounts to be a totally occluded (In-stent re-stenosis) of the OM2 branch with sub-optimal PTCA of the mid AV-Groove In-stent restenosis.  He is followed by Dr Rennis Golden. On 03/22/16 he called the office complaining of chest pain and was told to go to the ED. He described localized pain under his Lt breast, worse with palpation and deep breathing. He tells me he felt "faint" at times today. In ED his EKG is without acute changes, Troponin is negative x 2. He was admitted overnight for observation.  Hospital Course     Consultants: none  Atypical chest pain: Patient presented with chest pain that does not sound typical for angina. He says his pain was located under his left breast, reproducible with palpation. He has had MI in the past, and his anginal pain is very different from the pain he came in with. Review of clinic notes shows that patient has chronic chest pain. His pain worsened with breathing. His EKG is not concerning for ischemia. Troponin negative. Last cath was 7 months ago (report above). There is in stent re stenosis of OM2 branch. He is on max dose Ranexa. He was given IV Tordal last night for symptoms with relief. His symptoms were felt to be MSK, possibly costochondritis. Will start ibuprofen 600mg  TID x 7 days and continue protonix 40mg  daily.   HLD: continue statin  History of CAD: underwent LHC in 11/20416 with PCI to LCx: "Difficult situation with what amounts to be a totally occluded (In-stent re-stenosis) of the OM2 branch with sub-optimal PTCA of  the mid AV-Groove In-stent restenosis." -- Continue ASA 81 (was previously on 325mg ) and plavix 75mg  daily, imdur 30mg  daily, lopressor 100mg  BID and simvastatin 20mg  daily. Continue on Ranexa 1000mg  BID for chronic chest pain.   DM: continue current regimen   HTN: BP well controlled on current regimen.  The patient has had an uncomplicated hospital course and is recovering well. He has been seen by Dr. Excell Seltzer today and  deemed ready for discharge home. All follow-up appointments have been scheduled.  Discharge medications are listed below. The patient was previously dismissed from the practice so i could not arrange follow up today. (he saw dr Rennis Golden in 50/2017) They are working on the block to get him scheduled and will call him to have follow up at [facility] arranged.   _____________  Discharge Vitals Blood pressure 115/77, pulse 68, temperature 98.6 F (37 C), temperature source Oral, resp. rate 16, height 5\' 8"  (1.727 m), weight 262 lb 3.2 oz (118.933 kg), SpO2 94 %.  Filed Weights   03/22/16 1403 03/22/16 2117  Weight: 264 lb 9.6 oz (120.022 kg) 262 lb 3.2 oz (118.933 kg)    Labs & Radiologic Studies     CBC  Recent Labs  03/22/16 1406 03/22/16 2046  WBC 7.9 7.3  HGB 14.4 13.7  HCT 42.9 42.1  MCV 81.9 80.3  PLT 253 244   Basic Metabolic Panel  Recent Labs  03/22/16 1406 03/22/16 2046  NA 144  --   K 3.7  --   CL 106  --   CO2 28  --   GLUCOSE 111*  --   BUN 10  --   CREATININE 1.14 1.13  CALCIUM 8.9  --    Cardiac Enzymes  Recent Labs  03/22/16 2046 03/23/16 0123  TROPONINI <0.03 <0.03    Dg Chest 2 View  03/22/2016  CLINICAL DATA:  Left-sided chest pain and left arm pain. Shortness of breath. EXAM: CHEST  2 VIEW COMPARISON:  03/25/2015 and CT scan dated 07/03/2015 FINDINGS: The heart size and pulmonary vascularity are normal. Slight interstitial accentuation at the lung bases is felt to be due to a shallow inspiration. No acute infiltrate or effusion. No significant bone abnormality. IMPRESSION: No acute abnormality. Electronically Signed   By: Francene Boyers M.D.   On: 03/22/2016 14:42     Diagnostic Studies/Procedures    none _____________    Disposition   Pt is being discharged home today in good condition.  Follow-up Plans & Appointments    Follow-up Information    Follow up with Chrystie Nose, MD.   Specialty:  Cardiology   Why:  The office will call  you to make an appoinment., If you do not hear from them, please contact them., You should be seen within 2-3  weeks.   Contact information:   [address] [place] [address] [phone]        Discharge Medications   Current Discharge Medication List    CONTINUE these medications which have CHANGED   Details  aspirin 81 MG tablet Take 1 tablet (81 mg total) by mouth daily.      CONTINUE these medications which have NOT CHANGED   Details  acetaminophen (TYLENOL) 500 MG tablet Take 1,000 mg by mouth every 6 (six) hours as needed for headache.    acetaminophen-codeine (TYLENOL #3) 300-30 MG tablet Take 1-2 tablets by mouth every 6 (six) hours as needed for moderate pain. Qty: 70 tablet, Refills: 1  Associated Diagnoses: Lumbar disc herniation with radiculopathy    Albuterol Sulfate (PROAIR RESPICLICK) 108 (90 Base) MCG/ACT AEPB Inhale 1 puff into the lungs every 6 (six) hours as needed (shortness of breath). Qty: 1 each, Refills: 2    clopidogrel (PLAVIX) 75 MG tablet Take 1 tablet (75 mg total) by mouth daily. Qty: 90 tablet, Refills: 3    furosemide (LASIX) 20 MG tablet Take 1 tablet (20 mg total) by mouth daily as needed for fluid or edema. Qty: 30 tablet, Refills: 2   Associated Diagnoses: Presence of stent in left circumflex coronary artery    isosorbide mononitrate (IMDUR) 30 MG 24 hr tablet Take 2 tablets (60 mg total) by mouth 2 (two) times daily. Qty: 60 tablet, Refills: 3   Associated Diagnoses: Presence of stent in left circumflex coronary artery    metFORMIN (GLUCOPHAGE) 1000 MG tablet Take 1 tablet (1,000 mg total) by mouth 2 (two) times daily with a meal. Qty: 60 tablet, Refills: 2   Associated Diagnoses: Type 2 diabetes mellitus without complication, without long-term current use of insulin (HCC)    metoprolol (LOPRESSOR) 100 MG tablet Take 1 tablet (100 mg total) by mouth 2 (two) times daily. Qty: 60 tablet, Refills: 2   Associated  Diagnoses: Essential hypertension    mometasone (ASMANEX) 220 MCG/INH inhaler Inhale 2 puffs into the lungs daily. Qty: 1 Inhaler, Refills: 2    nitroGLYCERIN (NITROSTAT) 0.4 MG SL tablet Place 1 tablet (0.4 mg total) under the tongue every 5 (five) minutes as needed for chest pain. Qty: 25 tablet, Refills: 11    pantoprazole (PROTONIX) 40 MG tablet Take 1 tablet (40 mg total) by mouth daily. Qty: 30 tablet, Refills: 11   Associated Diagnoses: Gastroesophageal reflux disease without esophagitis    ranolazine (RANEXA) 1000 MG SR tablet Take 1 tablet (1,000 mg total) by mouth 2 (two) times daily. Qty: 60 tablet, Refills: 5    simvastatin (ZOCOR) 20 MG tablet Take 1 tablet (20 mg total) by mouth at bedtime. Qty: 90 tablet, Refills: 3   Associated Diagnoses: HLD (hyperlipidemia)    valsartan-hydrochlorothiazide (DIOVAN HCT) 320-25 MG tablet Take 1 tablet by mouth daily. Qty: 30 tablet, Refills: 2   Associated Diagnoses: Essential hypertension          Outstanding Labs/Studies  None   Duration of Discharge Encounter   Greater than 30 minutes including physician time.  Shari Heritage, KATHRYN R PA-C 03/23/2016, 2:26 PM

## 2016-03-24 ENCOUNTER — Telehealth: Payer: Self-pay | Admitting: Internal Medicine

## 2016-03-24 NOTE — Telephone Encounter (Signed)
Spoke to patient  no samples available contacted [address] office for patient- 1 box available for pick up  Patient aware to pick up at the [address] office  Patient states he can not afford medications - 30 day supply $400   He would like to know what option are there? Patient aware will defer to Dr Debara Pickett?

## 2016-03-24 NOTE — Telephone Encounter (Signed)
New message      Patient calling the office for samples of medication:   1.  What medication and dosage are you requesting samples for? ranexa '1000mg'$   2.  Are you currently out of this medication? almost

## 2016-03-24 NOTE — Telephone Encounter (Signed)
Ranexa 1000 mg, 14 tablets, Lot [id number], exp 2/20 left at front desk

## 2016-03-24 NOTE — Telephone Encounter (Signed)
He could try patient assistance programs since he is on disability and not working. They are income-dependent. Will ask Erasmo Downer about options - maybe the [employer] foundation? Not sure what Ranexa offers. It will be generic in 1-2 years, I think.  Dr. Lemmie Evens

## 2016-03-25 MED ORDER — RANOLAZINE ER 1000 MG PO TB12: 1000.0000 mg | ORAL_TABLET | Freq: Two times a day (BID) | ORAL | Status: DC

## 2016-03-25 NOTE — Telephone Encounter (Signed)
Physician portion of ranexa connect application completed, awaiting MD signature. Will call patient when he can pick up and he will complete remainder of form and submit

## 2016-03-26 ENCOUNTER — Telehealth: Payer: Self-pay | Admitting: Internal Medicine

## 2016-03-26 NOTE — Telephone Encounter (Signed)
Closed encounter °

## 2016-03-26 NOTE — Telephone Encounter (Signed)
Spoke with patient and informed him that [place] application is ready for pick up (MD portion and Rx completed & signed). Patient voiced understanding. Aware he will need to complete patient section and submit.

## 2016-03-29 ENCOUNTER — Other Ambulatory Visit: Payer: Self-pay | Admitting: Internal Medicine

## 2016-03-29 ENCOUNTER — Ambulatory Visit: Payer: Self-pay | Admitting: Family Medicine

## 2016-03-29 MED ORDER — RANOLAZINE ER 1000 MG PO TB12: 1000.0000 mg | ORAL_TABLET | Freq: Two times a day (BID) | ORAL | Status: DC

## 2016-04-14 MED FILL — SIMVASTATIN 20 MG TABLET: 20 | 90 days supply | Qty: 90 | Fill #1

## 2016-04-15 ENCOUNTER — Other Ambulatory Visit: Payer: Self-pay | Admitting: Family Medicine

## 2016-04-15 DIAGNOSIS — E119 Type 2 diabetes mellitus without complications: Secondary | ICD-10-CM

## 2016-04-15 DIAGNOSIS — I10 Essential (primary) hypertension: Secondary | ICD-10-CM

## 2016-04-15 MED ORDER — METFORMIN HCL 1000 MG PO TABS: 1000.0000 mg | ORAL_TABLET | Freq: Two times a day (BID) | ORAL | Status: DC

## 2016-04-15 MED ORDER — VALSARTAN-HYDROCHLOROTHIAZIDE 320-25 MG PO TABS: 1.0000 | ORAL_TABLET | Freq: Every day | ORAL | Status: DC

## 2016-04-16 ENCOUNTER — Ambulatory Visit: Payer: BC Managed Care – PPO | Attending: Family Medicine | Admitting: Family Medicine

## 2016-04-16 ENCOUNTER — Encounter: Payer: Self-pay | Admitting: Family Medicine

## 2016-04-16 VITALS — BP 145/96 | HR 79 | Temp 98.1°F | Resp 14 | Ht 68.0 in | Wt 263.4 lb

## 2016-04-16 DIAGNOSIS — M5116 Intervertebral disc disorders with radiculopathy, lumbar region: Secondary | ICD-10-CM | POA: Diagnosis not present

## 2016-04-16 DIAGNOSIS — Z9861 Coronary angioplasty status: Secondary | ICD-10-CM

## 2016-04-16 DIAGNOSIS — F329 Major depressive disorder, single episode, unspecified: Secondary | ICD-10-CM

## 2016-04-16 DIAGNOSIS — F32A Depression, unspecified: Secondary | ICD-10-CM

## 2016-04-16 DIAGNOSIS — E1169 Type 2 diabetes mellitus with other specified complication: Secondary | ICD-10-CM

## 2016-04-16 DIAGNOSIS — I2510 Atherosclerotic heart disease of native coronary artery without angina pectoris: Secondary | ICD-10-CM | POA: Diagnosis not present

## 2016-04-16 DIAGNOSIS — I10 Essential (primary) hypertension: Secondary | ICD-10-CM | POA: Diagnosis not present

## 2016-04-16 DIAGNOSIS — K219 Gastro-esophageal reflux disease without esophagitis: Secondary | ICD-10-CM

## 2016-04-16 DIAGNOSIS — R45851 Suicidal ideations: Secondary | ICD-10-CM

## 2016-04-16 LAB — GLUCOSE, POCT (MANUAL RESULT ENTRY): POC Glucose: 108 mg/dl — AB (ref 70–99)

## 2016-04-16 NOTE — Progress Notes (Signed)
Pt having suicidal ideation with a plan to shoot himself. Pt advised by Dr.Amao to go to ED via police or EMS. Pt refused and exited the clinic.

## 2016-04-16 NOTE — Progress Notes (Signed)
Subjective:    Patient ID: Andrew West, male    DOB: 05-31-1966, 50 y.o.   MRN: [id number]  HPI He is a 50 y.o. with a  Medical history of Type 2 DM (A1c 6.1),CAD s/p PCI/stent to LCx and OM1In 09/2015; cutting balloon angioplasty to OM 1, HLD, HTN, continuous tobacco abuse, chronic back pain s/p laminectomy/discectomy (3 years ago), depression who comes into the clinic for follow-up visit.  He had a recent hospitalization from 03/22/16 through 03/23/16 at [facility] after he presented with chest pains. His left heart cath from 09/2015 revealed "Difficult situation with what amounts to be a totally occluded (In-stent re-stenosis) of the OM2 branch with sub-optimal PTCA of the mid AV-Groove In-stent restenosis."; He underwent PCI to LCx.  Pain was thought to be atypical, as musculoskeletal origin and he was ruled out for acute coronary syndrome. He received IV Toradol and was discharged on ibuprofen.  On his depression screen today he indicated plans to harm himself and the fact that he had contemplated this many times but never followed through but does have a plan in place.  Past Medical History  Diagnosis Date  . CAD (coronary artery disease)     2002; treated with stent to mid L circumflex  . HLD (hyperlipidemia)   . Diabetes mellitus   . Hypertension   . GERD (gastroesophageal reflux disease)     Takes Dexilant  . Rhabdomyolysis     h/o, r/t statins  . Chronic leg pain     bilateral  . Chronic back pain   . Depression     Past Surgical History  Procedure Laterality Date  . Colonoscopy w/ polypectomy    . Lumbar laminectomy/decompression microdiscectomy  03/31/2012    Procedure: LUMBAR LAMINECTOMY/DECOMPRESSION MICRODISCECTOMY 1 LEVEL;  Surgeon: Temple Pacini, MD;  Location: [facility]UR[facility];  Service: Neurosurgery;  Laterality: Left;  . Transthoracic echocardiogram  07/28/2011    EF 55-65%; LVH, grade 1 diastolic dysfunction;   . Left heart catheterization with coronary  angiogram N/A 10/18/2011    Procedure: LEFT HEART CATHETERIZATION WITH CORONARY ANGIOGRAM;  Surgeon: Marykay Lex, MD;  Location: [facility] CATH LAB;  Service: Cardiovascular;  Laterality: N/A;  . Coronary angioplasty with stent placement  10/09/2001    PTCA & stenting of mid AV circumflex; 2.5x7mm Pixel stent  . Coronary angioplasty with stent placement  12/13/2001    PCI with stent to mid L circumflex, 95% stenosis to 0% residual  . Coronary angioplasty with stent placement  10/10/2003    PCI to mid AV circumflex; LAD 30% disease; RCA 100% occluded prox.  . Coronary angioplasty with stent placement  09/01/2011    PCI with stenting with bare metal stent to mid AV groove circumflex and PDA  . Coronary angioplasty with stent placement  10/17/2011    cutting balloon angioplasty of ostial lateral OM1 branch and bifurcation AV groove circumflex OM junction; stenosis reduced to 0%  . Coronary angioplasty  09/25/2015    mid cir & om  . Cardiac catheterization N/A 09/25/2015    Procedure: Left Heart Cath and Coronary Angiography;  Surgeon: Marykay Lex, MD;  Location: [facility] INVASIVE CV LAB;  Service: Cardiovascular;  Laterality: N/A;    Allergies  Allergen Reactions  . Iohexol     PT. TO BE PREMEDICATED PRIOR TO IV CONTRAST PER DR Eppie Gibson /MMS//12/15/15Desc: PT BECAME SOB AND CHEST TIGHTNESS AFTER CONTRAST INJECTION.  [name],RT-RCT., Onset Date: [id number]   . Vicodin [Hydrocodone-Acetaminophen] Itching  and Rash    Current Outpatient Prescriptions on File Prior to Visit  Medication Sig Dispense Refill  . acetaminophen (TYLENOL) 500 MG tablet Take 1,000 mg by mouth every 6 (six) hours as needed for headache.    Marland Kitchen acetaminophen-codeine (TYLENOL #3) 300-30 MG tablet Take 1-2 tablets by mouth every 6 (six) hours as needed for moderate pain. 70 tablet 1  . Albuterol Sulfate (PROAIR RESPICLICK) 108 (90 Base) MCG/ACT AEPB Inhale 1 puff into the lungs every 6 (six) hours as needed (shortness of  breath). 1 each 2  . aspirin 81 MG tablet Take 1 tablet (81 mg total) by mouth daily.    . clopidogrel (PLAVIX) 75 MG tablet Take 1 tablet (75 mg total) by mouth daily. 90 tablet 3  . furosemide (LASIX) 20 MG tablet Take 1 tablet (20 mg total) by mouth daily as needed for fluid or edema. 30 tablet 2  . isosorbide mononitrate (IMDUR) 30 MG 24 hr tablet Take 2 tablets (60 mg total) by mouth 2 (two) times daily. 60 tablet 3  . metFORMIN (GLUCOPHAGE) 1000 MG tablet Take 1 tablet (1,000 mg total) by mouth 2 (two) times daily with a meal. 180 tablet 1  . metoprolol (LOPRESSOR) 100 MG tablet Take 1 tablet (100 mg total) by mouth 2 (two) times daily. 60 tablet 2  . mometasone (ASMANEX) 220 MCG/INH inhaler Inhale 2 puffs into the lungs daily. 1 Inhaler 2  . nitroGLYCERIN (NITROSTAT) 0.4 MG SL tablet Place 1 tablet (0.4 mg total) under the tongue every 5 (five) minutes as needed for chest pain. 25 tablet 11  . pantoprazole (PROTONIX) 40 MG tablet Take 1 tablet (40 mg total) by mouth daily. 30 tablet 11  . ranolazine (RANEXA) 1000 MG SR tablet Take 1 tablet (1,000 mg total) by mouth 2 (two) times daily. 180 tablet 3  . simvastatin (ZOCOR) 20 MG tablet Take 1 tablet (20 mg total) by mouth at bedtime. 90 tablet 3  . valsartan-hydrochlorothiazide (DIOVAN HCT) 320-25 MG tablet Take 1 tablet by mouth daily. 90 tablet 1   No current facility-administered medications on file prior to visit.     Review of Systems Constitutional: Negative for activity change and appetite change.  HENT: Negative for sinus pressure and sore throat.   Eyes: Negative for visual disturbance.  Respiratory: Negative for cough, chest tightness and shortness of breath.   Cardiovascular: Negative for chest pain and leg swelling.  Gastrointestinal: Negative for abdominal pain, diarrhea, constipation and abdominal distention.  Endocrine: Negative.   Genitourinary: Negative for dysuria.  Musculoskeletal: Positive for back pain. Negative  for myalgias and joint swelling.  Skin: Negative for rash.  Allergic/Immunologic: Negative.   Neurological: Positive for weakness and numbness. Negative for light-headedness.  Psychiatric/Behavioral: Positive for suicidal ideation and intent    Objective: Filed Vitals:   04/16/16 0927  BP: 145/96  Pulse: 79  Temp: 98.1 F (36.7 C)  TempSrc: Oral  Resp: 14  Height: 5\' 8"  (1.727 m)  Weight: 263 lb 6.4 oz (119.477 kg)  SpO2: 97%      Physical Exam Constitutional: He is oriented to person, place, and time. He appears well-developed and well-nourished.  Cardiovascular: Normal rate, normal heart sounds and intact distal pulses.   No murmur heard. Pulmonary/Chest: Effort normal and breath sounds normal. He has no wheezes. He has no rales. He exhibits no tenderness.  Abdominal: Soft. Bowel sounds are normal. He exhibits no distension and no mass. There is no tenderness.  Musculoskeletal: He exhibits tenderness (  tenderness on palpation of the lumbar spine; positive straight leg raise bilaterally   ).  Reduced range of motion of lumbar spine.  Neurological: He is alert and oriented to person, place, and time.  Psych: flat affect, Depressed       Assessment & Plan:  Depression with suicidal ideations and intent: He is high risk because he informed me he has a plan and has thought about this several times but has not followed through. He is also afraid to sleep because when he closes his eyes he sees himself in a casket and has not slept for the last 2 days Plan is to commit the patient- I explained this to him and he was agreeable to going to the Ed to have a Psych eval. He got angry when he discovered the police were here and I went into the room to explain the circumstances to patient that this was protocol and we offered to transport him to the ER via nonemergent EMS where the police will not be involved but the patient got angry and insisted he be discharged. We called from [facility] to seek assistance and the patient walked out the clinic while arrangements are being made   Coronary artery disease : No angina at this time Aggressive risk factor modification including smoking cessation. Continue Ranexa, Plavix Scheduled to see cardiology (Dr Rennis Golden) on 04/20/16   Hypertension: Mildly elevated above goal of <140/90 Continue antihypertensives Lifestyle modification, low sodium, DASH diet   Diabetes mellitus: Controlled with A1c of 6.1. Continue metformin. Up-to-date on foot exam, microalbumin, Pneumovax, eye exam  Diabetic neuropathy/ radiculopathy secondary to herniated lumbar disc: Uncontrolled on gabapentin, Lyrica, tramadol, Flexeril which the patient has been taking at home but does not have with him today. On Tylenol 3 which he informed me he is able to tolerate without allergy Referred to a spine surgeon but was informed he would have to return to the surgeon who did his surgery and he is not willing to return to [facility] orthopedics.  GERD: Controlled on Nexium   This note has been created with Education officer, environmental. Any transcriptional errors are unintentional.

## 2016-04-16 NOTE — Patient Instructions (Signed)
Diabetes Mellitus and Food It is important for you to manage your blood sugar (glucose) level. Your blood glucose level can be greatly affected by what you eat. Eating healthier foods in the appropriate amounts throughout the day at about the same time each day will help you control your blood glucose level. It can also help slow or prevent worsening of your diabetes mellitus. Healthy eating may even help you improve the level of your blood pressure and reach or maintain a healthy weight.  General recommendations for healthful eating and cooking habits include:  Eating meals and snacks regularly. Avoid going long periods of time without eating to lose weight.  Eating a diet that consists mainly of plant-based foods, such as fruits, vegetables, nuts, legumes, and whole grains.  Using low-heat cooking methods, such as baking, instead of high-heat cooking methods, such as deep frying. Work with your dietitian to make sure you understand how to use the Nutrition Facts information on food labels. HOW CAN FOOD AFFECT ME? Carbohydrates Carbohydrates affect your blood glucose level more than any other type of food. Your dietitian will help you determine how many carbohydrates to eat at each meal and teach you how to count carbohydrates. Counting carbohydrates is important to keep your blood glucose at a healthy level, especially if you are using insulin or taking certain medicines for diabetes mellitus. Alcohol Alcohol can cause sudden decreases in blood glucose (hypoglycemia), especially if you use insulin or take certain medicines for diabetes mellitus. Hypoglycemia can be a life-threatening condition. Symptoms of hypoglycemia (sleepiness, dizziness, and disorientation) are similar to symptoms of having too much alcohol.  If your health care provider has given you approval to drink alcohol, do so in moderation and use the following guidelines:  Women should not have more than one drink per day, and men  should not have more than two drinks per day. One drink is equal to:  12 oz of beer.  5 oz of wine.  1 oz of hard liquor.  Do not drink on an empty stomach.  Keep yourself hydrated. Have water, diet soda, or unsweetened iced tea.  Regular soda, juice, and other mixers might contain a lot of carbohydrates and should be counted. WHAT FOODS ARE NOT RECOMMENDED? As you make food choices, it is important to remember that all foods are not the same. Some foods have fewer nutrients per serving than other foods, even though they might have the same number of calories or carbohydrates. It is difficult to get your body what it needs when you eat foods with fewer nutrients. Examples of foods that you should avoid that are high in calories and carbohydrates but low in nutrients include:  Trans fats (most processed foods list trans fats on the Nutrition Facts label).  Regular soda.  Juice.  Candy.  Sweets, such as cake, pie, doughnuts, and cookies.  Fried foods. WHAT FOODS CAN I EAT? Eat nutrient-rich foods, which will nourish your body and keep you healthy. The food you should eat also will depend on several factors, including:  The calories you need.  The medicines you take.  Your weight.  Your blood glucose level.  Your blood pressure level.  Your cholesterol level. You should eat a variety of foods, including:  Protein.  Lean cuts of meat.  Proteins low in saturated fats, such as fish, egg whites, and beans. Avoid processed meats.  Fruits and vegetables.  Fruits and vegetables that may help control blood glucose levels, such as apples, mangoes, and   yams.  Dairy products.  Choose fat-free or low-fat dairy products, such as milk, yogurt, and cheese.  Grains, bread, pasta, and rice.  Choose whole grain products, such as multigrain bread, whole oats, and brown rice. These foods may help control blood pressure.  Fats.  Foods containing healthful fats, such as nuts,  avocado, olive oil, canola oil, and fish. DOES EVERYONE WITH DIABETES MELLITUS HAVE THE SAME MEAL PLAN? Because every person with diabetes mellitus is different, there is not one meal plan that works for everyone. It is very important that you meet with a dietitian who will help you create a meal plan that is just right for you.   This information is not intended to replace advice given to you by your health care provider. Make sure you discuss any questions you have with your health care provider.   Document Released: 08/05/2005 Document Revised: 11/29/2014 Document Reviewed: 10/05/2013 Elsevier Interactive Patient Education 2016 [employer] Inc.  

## 2016-04-16 NOTE — Progress Notes (Signed)
Pt here for F/U for HTN. Leg pain, described as throbbing and aching. Pt needs refills on valsartan, simvastatin, and protonix with 90 day supply. Pt CBG is 108. Pt responded "yes" on the depression screening form indicated he has made plans to end his life in the last 2 weeks. Pt states he has been having crazy thoughts lately and has been unable to sleep.

## 2016-04-20 ENCOUNTER — Ambulatory Visit (INDEPENDENT_AMBULATORY_CARE_PROVIDER_SITE_OTHER): Payer: BC Managed Care – PPO | Admitting: Internal Medicine

## 2016-04-20 ENCOUNTER — Encounter: Payer: Self-pay | Admitting: Internal Medicine

## 2016-04-20 VITALS — BP 118/86 | HR 85 | Ht 68.0 in | Wt 260.2 lb

## 2016-04-20 DIAGNOSIS — I2510 Atherosclerotic heart disease of native coronary artery without angina pectoris: Secondary | ICD-10-CM

## 2016-04-20 DIAGNOSIS — M5010 Cervical disc disorder with radiculopathy, unspecified cervical region: Secondary | ICD-10-CM | POA: Diagnosis not present

## 2016-04-20 DIAGNOSIS — Z9861 Coronary angioplasty status: Secondary | ICD-10-CM

## 2016-04-20 DIAGNOSIS — I10 Essential (primary) hypertension: Secondary | ICD-10-CM | POA: Diagnosis not present

## 2016-04-20 DIAGNOSIS — E785 Hyperlipidemia, unspecified: Secondary | ICD-10-CM | POA: Diagnosis not present

## 2016-04-20 MED ORDER — GABAPENTIN 300 MG PO CAPS: 300.0000 mg | ORAL_CAPSULE | Freq: Every day | ORAL | Status: DC

## 2016-04-20 MED FILL — VALSARTAN-HCTZ 320-25 MG TA: 320-25 | 90 days supply | Qty: 90 | Fill #0

## 2016-04-20 MED FILL — GABAPENTIN 300 MG CAPSULE: 300 | 30 days supply | Qty: 30 | Fill #0

## 2016-04-20 NOTE — Progress Notes (Signed)
OFFICE NOTE  Chief Complaint:  Follow-up hospitalization  Primary Care Physician: Arnoldo Morale, MD  HPI:  JERMINE BIBBEE  is a 50 year old gentleman with a history of coronary artery disease and stent placement to the circumflex and obtuse marginal and a history of in stent restenosis to the LAD. There is also a nondominant right coronary artery that is 100% occluded, filled with collaterals. He did have a stress test recently, which showed an EF of 53% and reversible septal ischemia in the setting of chest pain and after much convincing underwent cardiac catheterization.  He then underwent cutting balloon angioplasty to an ostial lateral OM1 branch and the bifurcation AV groove circumflex OM junction. This was successful at reducing the stenosis to 0%. This was in November 2012 and he did not return for followup appointment until April 2013. He was suffering from low back pain and has been evaluated and treated by Dr. Trenton Gammon with a laminectomy/discectomy, which he says was not helpful. Otherwise, he continues to smoke and occasionally complains of shortness of breath and some chest pain symptoms which are atypical. He is on long-acting and/or has not needed to take short-acting nitroglycerin. His other concern today is that he is having problems with his teeth and was recommended to have edentulation and by an oral surgeon Dr. Diona Browner. Finally, he is suffering from significant stress and depression due to recent separation with wife in dealing with his kids. This seems to be a big tablets on his continued smoking.  Mr. Oquendo returns today in the office. He feels fairly well. He denies any chest pain. He continues to have problems with low back pain and numbness and tingling in his legs. He is apparently status post laminectomy and microdiscectomy by Dr. Trenton Gammon and continues to have symptoms which may be related to that. He's also complaining of what sounds like neuropathic pain.  He is not currently on medication. He is asking for Tylenol 3 for pain today which I told him I did not prescribe. Ultimately there are no further surgical or nonsurgical options, he may need to go on a neuropathic pain medication or perhaps be referred to a pain management specialist. He is also looking for a new primary care provider as his primary care provider is close to retirement. He reports he has cut back his smoking to about 1 pack every 2 weeks. He is also been out of amlodipine and simvastatin although his blood pressure is well controlled.  I saw Mr. Gries in the office today. He is reporting now complains of shortness of breath and chest pain. He also feels like his breathing is worse when working around chemicals at work. He uses a respiratory protection mask but also feels like he is under significant stress. He's had missed several days of work and is concerned about his job. Sounds like he is in a difficult work environment. He says that he is apologizing for "deceiving me" that he was not honest about his symptoms. He apparently has been having shortness of breath and chest pain for several months and did not relay that to me.  Mr. Rickey returns to the office today for follow-up. His main complaint is shortness of breath. He's not feeling as much chest pain he's had been previously. We recently did a nuclear stress test which was negative for ischemia and showed an EF of 56%. With regards to shortness of breath he's concerned most of this was related to chemicals at work although he  has not worked in several months. He's had about 20 pound weight gain since we last saw him in the office and denies any lower extremity swelling, orthopnea or PND. Shortness of breath is persistent and is associated with some wheezing. He did see Dr. 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 Shortness of breath seems to be a little bit worse with exertion which makes me wonder whether there is an element of exercise-induced pulmonary hypertension. He's not had an echocardiogram in some time.  Mr. Tally returns today for follow-up. He was noted to have some diastolic dysfunction on his echocardiogram. I started him on low-dose Lasix and check lab work including a BNP which is very low around 50. On follow-up today he reports he feels no different with the addition of Lasix. I therefore asked him to take it as needed. He is also describing worsening substernal chest discomfort and a squeezing pressure in his chest today. He says this is been getting worse over the past several days, more than his typical chest discomfort. As previously noted he recently underwent a nuclear stress test a few months ago which was negative for ischemia.   Mr. Thune returns for follow-up. As mentioned he had had recent progressive chest pain symptoms and worsening shortness of breath. He underwent another cardiac catheterization which demonstrated the following:   Ost 2nd Mrg to 2nd Mrg lesion, 99% stenosed. Post intervention, 99% residual stenosis remained. The lesion was previously treated with a bare metal stentgreater than two years ago.  Mid Cx-2 lesion, 90% stenosed. Post intervention, there is a 20% residual stenosis.  Mid Cx-1 lesion, 80% stenosed. Post intervention, there is a 50% residual stenosis. The lesion was previously treated with a bare metal stent.  Ramus lesion, 100% stenosed. The lesion was previously treated with a bare metal stentgreater than two years ago.  Prox RCA lesion, 100% stenosed.  There is mild left ventricular systolic dysfunction.  Moderately elevated LVEDP of 20 mmHg   Difficult situation with what amounts to be a totally occluded (In-stent re-stenosis) of the OM2 branch with sub-optimal PTCA of the mid AV-Groove In-stent restenosis. He  continues to have chest pain although reports it somewhat improved. He was started on ranolazine and currently is on 1000 mg twice a day.  Mr. Utsey returns today for follow-up. He reports he is doing fairly well on medical therapy. He still gets some sharp chest pain mostly along the right sternal border. He does get short of breath with moderate exertion. He's trying to do some walking on a treadmill but generally stops the exercise once he gets short of breath because he is very nervous. He is not currently working due to combined cardiac pulmonary and musculoskeletal diseases. He says that he and his wife are going to go on a delayed honeymoon since they never took one.  04/20/2016  Mr. Hockenbury returns today for follow-up. He was recently seen in the hospital in the beginning of May for chest pain. He reports that it's up in the left neck base around the area of the left clavicle. It's very exquisitely tender to touch and feels sharp and electric. This pain is related we think to cervical pain. He ruled out for MI and was not worked up further for coronary ischemia. This pain is felt to be distinctly different. His isosorbide was increased  up to 60 mg twice a day but he notes no change in his symptoms with that. His primary care providers hesitant to provide any pain medication.  PMHx:  Past Medical History  Diagnosis Date  . CAD (coronary artery disease)     2002; treated with stent to mid L circumflex  . HLD (hyperlipidemia)   . Diabetes mellitus   . Hypertension   . GERD (gastroesophageal reflux disease)     Takes Dexilant  . Rhabdomyolysis     h/o, r/t statins  . Chronic leg pain     bilateral  . Chronic back pain   . Depression     Past Surgical History  Procedure Laterality Date  . Colonoscopy w/ polypectomy    . Lumbar laminectomy/decompression microdiscectomy  03/31/2012    Procedure: LUMBAR LAMINECTOMY/DECOMPRESSION MICRODISCECTOMY 1 LEVEL;  Surgeon: Charlie Pitter, MD;   Location: [facility];  Service: Neurosurgery;  Laterality: Left;  . Transthoracic echocardiogram  07/28/2011    EF 55-65%; LVH, grade 1 diastolic dysfunction;   . Left heart catheterization with coronary angiogram N/A 10/18/2011    Procedure: LEFT HEART CATHETERIZATION WITH CORONARY ANGIOGRAM;  Surgeon: Leonie Man, MD;  Location[facility];  Service: Cardiovascular;  Laterality: N/A;  . Coronary angioplasty with stent placement  10/09/2001    PTCA & stenting of mid AV circumflex; 2.5x62m Pixel stent  . Coronary angioplasty with stent placement  12/13/2001    PCI with stent to mid L circumflex, 95% stenosis to 0% residual  . Coronary angioplasty with stent placement  10/10/2003    PCI to mid AV circumflex; LAD 30% disease; RCA 100% occluded prox.  . Coronary angioplasty with stent placement  09/01/2011    PCI with stenting with bare metal stent to mid AV groove circumflex and PDA  . Coronary angioplasty with stent placement  10/17/2011    cutting balloon angioplasty of ostial lateral OM1 branch and bifurcation AV groove circumflex OM junction; stenosis reduced to 0%  . Coronary angioplasty  09/25/2015    mid cir & om  . Cardiac catheterization N/A 09/25/2015    Procedure: Left Heart Cath and Coronary Angiography;  Surgeon: DLeonie Man MD;  [facility];  Service: Cardiovascular;  Laterality: N/A;    FAMHx:  Family History  Problem Relation Age of Onset  . Anesthesia problems Neg Hx   . Hypotension Neg Hx   . Malignant hyperthermia Neg Hx   . Pseudochol deficiency Neg Hx   . Hypertension Mother   . Heart attack Father   . Diabetes Mother   . Heart disease Brother     x 3   . Heart attack Brother     deceased  . Hypertension Sister   . Diabetes Sister     SOCHx:   reports that he quit smoking about 10 months ago. His smoking use included Cigarettes. He has a 6.25 pack-year smoking history. He has never used smokeless tobacco. He reports that he does not  drink alcohol or use illicit drugs.  ALLERGIES:  Allergies  Allergen Reactions  . Iohexol     PT. TO BE PREMEDICATED PRIOR TO IV CONTRAST PER DR SKris Hartmann/MMS//12/15/15Desc: PT BECAME SOB AND CHEST TIGHTNESS AFTER CONTRAST INJECTION.  [name],RT-RCT., Onset Date: [id number]  . Vicodin [Hydrocodone-Acetaminophen] Itching and Rash    ROS: Pertinent items noted in HPI and remainder of comprehensive ROS otherwise negative.  HOME MEDS: Current Outpatient Prescriptions  Medication Sig Dispense Refill  .  acetaminophen (TYLENOL) 500 MG tablet Take 1,000 mg by mouth every 6 (six) hours as needed for headache.    Marland Kitchen acetaminophen-codeine (TYLENOL #3) 300-30 MG tablet Take 1-2 tablets by mouth every 6 (six) hours as needed for moderate pain. 70 tablet 1  . Albuterol Sulfate (PROAIR RESPICLICK) 476 (90 Base) MCG/ACT AEPB Inhale 1 puff into the lungs every 6 (six) hours as needed (shortness of breath). 1 each 2  . aspirin 81 MG tablet Take 1 tablet (81 mg total) by mouth daily.    . clopidogrel (PLAVIX) 75 MG tablet Take 1 tablet (75 mg total) by mouth daily. 90 tablet 3  . furosemide (LASIX) 20 MG tablet Take 1 tablet (20 mg total) by mouth daily as needed for fluid or edema. 30 tablet 2  . isosorbide mononitrate (IMDUR) 30 MG 24 hr tablet Take 2 tablets (60 mg total) by mouth 2 (two) times daily. 60 tablet 3  . metFORMIN (GLUCOPHAGE) 1000 MG tablet Take 1 tablet (1,000 mg total) by mouth 2 (two) times daily with a meal. 180 tablet 1  . metoprolol (LOPRESSOR) 100 MG tablet Take 1 tablet (100 mg total) by mouth 2 (two) times daily. 60 tablet 2  . mometasone (ASMANEX) 220 MCG/INH inhaler Inhale 2 puffs into the lungs daily. 1 Inhaler 2  . nitroGLYCERIN (NITROSTAT) 0.4 MG SL tablet Place 1 tablet (0.4 mg total) under the tongue every 5 (five) minutes as needed for chest pain. 25 tablet 11  . pantoprazole (PROTONIX) 40 MG tablet Take 1 tablet (40 mg total) by mouth daily. 30 tablet 11  . ranolazine  (RANEXA) 1000 MG SR tablet Take 1 tablet (1,000 mg total) by mouth 2 (two) times daily. 180 tablet 3  . simvastatin (ZOCOR) 20 MG tablet Take 1 tablet (20 mg total) by mouth at bedtime. 90 tablet 3  . valsartan-hydrochlorothiazide (DIOVAN HCT) 320-25 MG tablet Take 1 tablet by mouth daily. 90 tablet 1  . gabapentin (NEURONTIN) 300 MG capsule Take 1 capsule (300 mg total) by mouth at bedtime. 30 capsule 5   No current facility-administered medications for this visit.    LABS/IMAGING: No results found for this or any previous visit (from the past 48 hour(s)). No results found.  VITALS: BP 118/86 mmHg  Pulse 85  Ht '5\' 8"'$  (1.727 m)  Wt 260 lb 3.2 oz (118.026 kg)  BMI 39.57 kg/m2  SpO2 95%  EXAM: General appearance: alert, no distress and morbidly obese Neck: no carotid bruit and no JVD Lungs: clear to auscultation bilaterally Heart: regular rate and rhythm, S1, S2 normal, no murmur, click, rub or gallop Abdomen: soft, non-tender; bowel sounds normal; no masses,  no organomegaly Extremities: extremities normal, atraumatic, no cyanosis or edema and reproducible chest discomfort at the left neck base/clavicular area Pulses: 2+ and symmetric Skin: Skin color, texture, turgor normal. No rashes or lesions Neurologic: Grossly normal Psych: Pleasant  EKG: deferred  ASSESSMENT:  1. Neuropathic chest pain -may be cervicalgia 2. Chest pain and dyspnea on exertion - Occluded OM2 at a prior stent - no good revascularization options 3. Coronary artery disease status post PCI and cutting balloon angioplasty over 2 years ago - negative Lexiscan for ischemia 4. Ongoing tobacco abuse 5. Hypertension 6. Dyslipidemia 7. Neuropathy 8. Persistent low back pain 9. Diabetes type 2 10. Morbid obesity  PLAN: 1.   Mr. Fetterman was recently seen in the hospital for atypical chest pain. It's reproducible and sharp and most likely is neuropathic pain. He continues to  have problems with this and I think  would benefit from a neuropathic pain medicine. I recommend starting Neurontin 300 mg daily at bedtime. We'll plan to see him back in 1-2 months to see if this is helped somewhat with his pain. I also signed ongoing paperwork regarding permanent disability from his prior job.   Pixie Casino, MD, [facility] Attending Cardiologist [place] 04/20/2016, 2:46 PM

## 2016-04-20 NOTE — Patient Instructions (Signed)
Dr. Debara Pickett has prescribed GABAPENTIN to take at bedtime for neuropathic chest pain  Your physician recommends that you schedule a follow-up appointment in: [place] with Dr. Debara Pickett

## 2016-04-21 MED FILL — METOPROLOL TARTRATE 100 MG: 100 | 30 days supply | Qty: 60 | Fill #1

## 2016-04-21 MED FILL — PANTOPRAZOLE SOD DR 40 MG T: 40 | 90 days supply | Qty: 90 | Fill #2

## 2016-04-21 MED FILL — RANEXA ER 1,000 MG TABLET: 1000 | 30 days supply | Qty: 60 | Fill #0

## 2016-04-21 MED FILL — metFORMIN HCL 1000 MG TABS: 1000 | 90 days supply | Qty: 180 | Fill #0

## 2016-04-22 ENCOUNTER — Telehealth: Payer: Self-pay | Admitting: Family Medicine

## 2016-04-22 MED FILL — CLOPIDOGREL 75 MG TABLET: 75 | 90 days supply | Qty: 90 | Fill #2

## 2016-04-22 NOTE — Telephone Encounter (Signed)
This Case Manager placed return call to patient. Patient indicated he was having dental pain-Pain 9.5/10. Patient indicated he has two front teeth that are "broke off at the root" and 4-5 back teeth that are broken. He indicated his dentist appointment is not until July.  Patient also indicated he had a fever of 101 last night and is having difficulty eating due to his dental pain. Currently afebrile. Patient inquired if antibiotic needed. Patient also stated he is out of Tylenol #3. Said he had a refill remaining on script; however, when he went to pick up medication from [facility] on [facility] he was informed authorization needed for refill. This Case Manager inquired if patient taking tylenol PRN for pain. Patient indicated he was not and needed refill. Informed patient medication OTC.  Call placed to [facility] to inquire about the authorization needed for Tylenol #3. Spoke with Patrease who indicated [facility] unable to fill Tylenol #3 as a new script is needed. Will route encounter to Dr. Jarold Song.

## 2016-04-22 NOTE — Telephone Encounter (Signed)
Patient called and stated that he was supposed to be prescribed an antibiotic for his tooth. Patient also needs tylenol 3. Please follow up.

## 2016-04-23 ENCOUNTER — Telehealth: Payer: Self-pay

## 2016-04-23 MED ORDER — AMOXICILLIN 500 MG PO CAPS: 500.0000 mg | ORAL_CAPSULE | Freq: Three times a day (TID) | ORAL | Status: DC

## 2016-04-23 MED ORDER — MELOXICAM 7.5 MG PO TABS: 7.5000 mg | ORAL_TABLET | Freq: Every day | ORAL | Status: DC

## 2016-04-23 MED FILL — AMOXICILLIN 500 MG CAPSULE: 500 | 10 days supply | Qty: 30 | Fill #0

## 2016-04-23 MED FILL — MELOXICAM 7.5 MG TABLET: 7.5 | 30 days supply | Qty: 30 | Fill #0

## 2016-04-23 NOTE — Telephone Encounter (Signed)
Patient called and informed that Dr. Jarold Song ordered Amoxicillin 500 mg three times daily and meloxicam 7.5 mg daily for his toothache.  Patient informed that medications sent to [facility] and [place]. Instructed patient on importance of completing entire course of antibiotics. Patient verbalized understanding. No additional needs identified.

## 2016-04-23 NOTE — Telephone Encounter (Signed)
I have sent a prescription for amoxicillin and meloxicam to his pharmacy for his toothache pending his appointment with his dentist. I am unable to refill Tylenol 3 (which is a controlled substance) due to his failure to seek help for depression and suicidal ideation and intent despite my recommendations - please refer to my last note.

## 2016-04-30 ENCOUNTER — Telehealth: Payer: Self-pay | Admitting: Cardiology

## 2016-04-30 DIAGNOSIS — Z79899 Other long term (current) drug therapy: Secondary | ICD-10-CM

## 2016-04-30 NOTE — Telephone Encounter (Signed)
Called with feet and leg swelling over a few days.  Only mild SOB.  No distress.  Told to increase his Lasix to 40 mg for the next three days.  We need to call him on Monday to check a BMET and see if he needs follow up.

## 2016-05-04 NOTE — Addendum Note (Signed)
Addended by: Fidel Levy on: 05/04/2016 08:55 AM   Modules accepted: Orders

## 2016-05-04 NOTE — Telephone Encounter (Signed)
Thanks .Marland Kitchen Will look for that result tomorrow.  Dr. Lemmie Evens

## 2016-05-04 NOTE — Telephone Encounter (Signed)
Spoke with patient. He states his legs are still swollen and hurting. Swelling about the same. A little SOB - same as it was on 6/9. Patient states weights are stable.   Patient will come get BMET today - ordered STAT to be processed quickly.

## 2016-05-04 NOTE — Telephone Encounter (Signed)
LM for patient to call back.

## 2016-05-07 ENCOUNTER — Telehealth: Payer: Self-pay | Admitting: Family Medicine

## 2016-05-07 NOTE — Telephone Encounter (Signed)
Placed call to patient and LVM for patient and asked him to return call.

## 2016-05-07 NOTE — Telephone Encounter (Signed)
Pt. Called stating that his legs have been bothering him.  Pt. Stated that his legs hurt and he has fell twice. He would  Like to speak with his nurse. Please f/u with pt.

## 2016-05-21 ENCOUNTER — Ambulatory Visit: Payer: BC Managed Care – PPO | Admitting: Internal Medicine

## 2016-05-27 NOTE — Telephone Encounter (Signed)
Pt. Called stating that his legs have been bothering him.  Pt. Stated that his legs hurt and he has fell twice. He would  Like to speak with his nurse. Please f/u with pt.

## 2016-05-27 NOTE — Telephone Encounter (Signed)
Patient called again wanting to speak with the nurse

## 2016-05-28 NOTE — Telephone Encounter (Signed)
Pt. Called to verify appointment on Monday. Pt. Also stated he has been calling since last week requesting to speak with a nurse and said no one has returned his call.  Pt. Was offered to wait and see if I could get in touch with the nurse and he stated he did not want to speak with no one and would call the hospital to complain. Pt. Also  Stated he would complain with the office manager on Monday when he comes to his appointment.

## 2016-05-31 ENCOUNTER — Ambulatory Visit: Payer: BC Managed Care – PPO | Attending: Family Medicine | Admitting: Family Medicine

## 2016-05-31 ENCOUNTER — Encounter: Payer: Self-pay | Admitting: Family Medicine

## 2016-05-31 VITALS — BP 156/108 | HR 75 | Temp 98.2°F | Ht 68.0 in | Wt 263.0 lb

## 2016-05-31 DIAGNOSIS — I5189 Other ill-defined heart diseases: Secondary | ICD-10-CM

## 2016-05-31 DIAGNOSIS — E119 Type 2 diabetes mellitus without complications: Secondary | ICD-10-CM

## 2016-05-31 DIAGNOSIS — I2510 Atherosclerotic heart disease of native coronary artery without angina pectoris: Secondary | ICD-10-CM

## 2016-05-31 DIAGNOSIS — M5116 Intervertebral disc disorders with radiculopathy, lumbar region: Secondary | ICD-10-CM

## 2016-05-31 DIAGNOSIS — I10 Essential (primary) hypertension: Secondary | ICD-10-CM

## 2016-05-31 DIAGNOSIS — I519 Heart disease, unspecified: Secondary | ICD-10-CM

## 2016-05-31 DIAGNOSIS — F329 Major depressive disorder, single episode, unspecified: Secondary | ICD-10-CM

## 2016-05-31 DIAGNOSIS — Z9861 Coronary angioplasty status: Secondary | ICD-10-CM

## 2016-05-31 DIAGNOSIS — F32A Depression, unspecified: Secondary | ICD-10-CM

## 2016-05-31 LAB — POCT GLYCOSYLATED HEMOGLOBIN (HGB A1C): Hemoglobin A1C: 5.8

## 2016-05-31 LAB — GLUCOSE, POCT (MANUAL RESULT ENTRY): POC GLUCOSE: 124 mg/dL — AB (ref 70–99)

## 2016-05-31 MED ORDER — TRAMADOL HCL 50 MG PO TABS: 50.0000 mg | ORAL_TABLET | Freq: Every day | ORAL | Status: DC

## 2016-05-31 NOTE — Progress Notes (Signed)
Subjective:    Patient ID: Andrew West, male    DOB: [birth year], 50 y.o.   MRN: [id number]  HPI He is a 49 y.o. with a  Medical history of Type 2 DM (A1c 5.8),CAD s/p PCI/stent to LCx and OM1In 09/2015; cutting balloon angioplasty to OM 1, HLD, HTN, continuous tobacco abuse, chronic back pain s/p laminectomy/discectomy (3 years ago), depression who comes into the clinic for follow-up visit.  At his last office visit he had severe depression had admitted to suicidal ideations but had refused involuntary commitment at the time. Today he informs me he is currently seeing a psychiatrist and undergoing therapy but is not yet on antidepressants. He endorses several stressors including a sick daughter, his health including severe back pain which are triggers for his depression but denies suicidal ideation or intent at this time.  His back continues to hurt and he would like to see another orthopedic besides [facility] orthopedics; back pain radiates to right leg which sometimes feels weak and gives out on him resulting in falls. He has associated numbness in his lower extremities but denies loss of sphincteric function. Currently on gabapentin which does not help symptoms and he is not interested in seeing pain management.  He is currently applying for disability and is scheduled to see his psychiatrist tomorrow.  Past Medical History  Diagnosis Date  . CAD (coronary artery disease)     2002; treated with stent to mid L circumflex  . HLD (hyperlipidemia)   . Diabetes mellitus   . Hypertension   . GERD (gastroesophageal reflux disease)     Takes Dexilant  . Rhabdomyolysis     h/o, r/t statins  . Chronic leg pain     bilateral  . Chronic back pain   . Depression     Past Surgical History  Procedure Laterality Date  . Colonoscopy w/ polypectomy    . Lumbar laminectomy/decompression microdiscectomy  03/31/2012    Procedure: LUMBAR LAMINECTOMY/DECOMPRESSION MICRODISCECTOMY 1 LEVEL;   Surgeon: Temple Pacini, MD;  Location: [facility];  Service: Neurosurgery;  Laterality: Left;  . Transthoracic echocardiogram  07/28/2011    EF 55-65%; LVH, grade 1 diastolic dysfunction;   . Left heart catheterization with coronary angiogram N/A 10/18/2011    Procedure: LEFT HEART CATHETERIZATION WITH CORONARY ANGIOGRAM;  Surgeon: Marykay Lex, MD;  Location: [facility] CATH LAB;  Service: Cardiovascular;  Laterality: N/A;  . Coronary angioplasty with stent placement  10/09/2001    PTCA & stenting of mid AV circumflex; 2.5x7mm Pixel stent  . Coronary angioplasty with stent placement  12/13/2001    PCI with stent to mid L circumflex, 95% stenosis to 0% residual  . Coronary angioplasty with stent placement  10/10/2003    PCI to mid AV circumflex; LAD 30% disease; RCA 100% occluded prox.  . Coronary angioplasty with stent placement  09/01/2011    PCI with stenting with bare metal stent to mid AV groove circumflex and PDA  . Coronary angioplasty with stent placement  10/17/2011    cutting balloon angioplasty of ostial lateral OM1 branch and bifurcation AV groove circumflex OM junction; stenosis reduced to 0%  . Coronary angioplasty  09/25/2015    mid cir & om  . Cardiac catheterization N/A 09/25/2015    Procedure: Left Heart Cath and Coronary Angiography;  Surgeon: Marykay Lex, MD;  Location: [facility] INVASIVE CV LAB;  Service: Cardiovascular;  Laterality: N/A;    Allergies  Allergen Reactions  . Iohexol  PT. TO BE PREMEDICATED PRIOR TO IV CONTRAST PER DR STAHL /MMS//12/15/15Desc: PT BECAME SOB AND CHEST TIGHTNESS AFTER CONTRAST INJECTION.  [name],RT-RCT., Onset Date: [id number]   . Vicodin [Hydrocodone-Acetaminophen] Itching and Rash    Current Outpatient Prescriptions on File Prior to Visit  Medication Sig Dispense Refill  . acetaminophen (TYLENOL) 500 MG tablet Take 1,000 mg by mouth every 6 (six) hours as needed for headache.    . Albuterol Sulfate (PROAIR RESPICLICK) 108 (90 Base)  MCG/ACT AEPB Inhale 1 puff into the lungs every 6 (six) hours as needed (shortness of breath). 1 each 2  . aspirin 81 MG tablet Take 1 tablet (81 mg total) by mouth daily.    . clopidogrel (PLAVIX) 75 MG tablet Take 1 tablet (75 mg total) by mouth daily. 90 tablet 3  . furosemide (LASIX) 20 MG tablet Take 1 tablet (20 mg total) by mouth daily as needed for fluid or edema. 30 tablet 2  . gabapentin (NEURONTIN) 300 MG capsule Take 1 capsule (300 mg total) by mouth at bedtime. (Patient taking differently: Take 300 mg by mouth 2 (two) times daily. ) 30 capsule 5  . isosorbide mononitrate (IMDUR) 30 MG 24 hr tablet Take 2 tablets (60 mg total) by mouth 2 (two) times daily. 60 tablet 3  . meloxicam (MOBIC) 7.5 MG tablet Take 1 tablet (7.5 mg total) by mouth daily. 30 tablet 0  . metFORMIN (GLUCOPHAGE) 1000 MG tablet Take 1 tablet (1,000 mg total) by mouth 2 (two) times daily with a meal. 180 tablet 1  . metoprolol (LOPRESSOR) 100 MG tablet Take 1 tablet (100 mg total) by mouth 2 (two) times daily. 60 tablet 2  . mometasone (ASMANEX) 220 MCG/INH inhaler Inhale 2 puffs into the lungs daily. 1 Inhaler 2  . pantoprazole (PROTONIX) 40 MG tablet Take 1 tablet (40 mg total) by mouth daily. 30 tablet 11  . ranolazine (RANEXA) 1000 MG SR tablet Take 1 tablet (1,000 mg total) by mouth 2 (two) times daily. 180 tablet 3  . simvastatin (ZOCOR) 20 MG tablet Take 1 tablet (20 mg total) by mouth at bedtime. 90 tablet 3  . valsartan-hydrochlorothiazide (DIOVAN HCT) 320-25 MG tablet Take 1 tablet by mouth daily. 90 tablet 1  . nitroGLYCERIN (NITROSTAT) 0.4 MG SL tablet Place 1 tablet (0.4 mg total) under the tongue every 5 (five) minutes as needed for chest pain. (Patient not taking: Reported on 05/31/2016) 25 tablet 11   No current facility-administered medications on file prior to visit.      Review of Systems Constitutional: Negative for activity change and appetite change.  HENT: Negative for sinus pressure and  sore throat.   Eyes: Negative for visual disturbance.  Respiratory: Negative for cough, chest tightness and shortness of breath.   Cardiovascular: Negative for chest pain and leg swelling.  Gastrointestinal: Negative for abdominal pain, diarrhea, constipation and abdominal distention.  Endocrine: Negative.   Genitourinary: Negative for dysuria.  Musculoskeletal: Positive for back pain. Negative for myalgias and joint swelling.  Skin: Negative for rash.  Allergic/Immunologic: Negative.   Neurological: Positive for weakness and numbness. Negative for light-headedness.  Psychiatric/Behavioral: Positive for suicidal ideation and intent       Objective: Filed Vitals:   05/31/16 1047 05/31/16 1052  BP: 158/103 156/108  Pulse: 75   Temp: 98.2 F (36.8 C)   TempSrc: Oral   Height: 5\' 8"  (1.727 m)   Weight: 263 lb (119.296 kg)   SpO2: 100%  Physical Exam Constitutional: He is oriented to person, place, and time. He appears well-developed and well-nourished.  Cardiovascular: Normal rate, normal heart sounds and intact distal pulses.   No murmur heard. Pulmonary/Chest: Effort normal and breath sounds normal. He has no wheezes. He has no rales. He exhibits no tenderness.  Abdominal: Soft. Bowel sounds are normal. He exhibits no distension and no mass. There is no tenderness.  Musculoskeletal: He exhibits tenderness (tenderness on palpation of the lumbar spine; positive straight leg raise bilaterally   ).  Reduced range of motion of lumbar spine.  Neurological: He is alert and oriented to person, place, and time.  Psych: normal   Lab Results  Component Value Date   HGBA1C 5.8 05/31/2016    CMP Latest Ref Rng 03/22/2016 03/22/2016 09/26/2015  Glucose 65 - 99 mg/dL - 734(L) 937(T)  BUN 6 - 20 mg/dL - 10 9  Creatinine 0.24 - 1.24 mg/dL 0.97 3.53 2.99  Sodium 135 - 145 mmol/L - 144 139  Potassium 3.5 - 5.1 mmol/L - 3.7 4.1  Chloride 101 - 111 mmol/L - 106 102  CO2 22 - 32 mmol/L -  28 26  Calcium 8.9 - 10.3 mg/dL - 8.9 9.0  Total Protein 6.0 - 8.3 g/dL - - -  Total Bilirubin 0.3 - 1.2 mg/dL - - -  Alkaline Phos 39 - 117 U/L - - -  AST 0 - 37 U/L - - -  ALT 0 - 53 U/L - - -     Lipid Panel     Component Value Date/Time   CHOL  06/03/2010 0435    112        ATP III CLASSIFICATION:  <200     mg/dL   Desirable  242-683  mg/dL   Borderline High  >=419    mg/dL   High          TRIG 622* 06/03/2010 0435   HDL 20* 06/03/2010 0435   CHOLHDL 5.6 06/03/2010 0435   VLDL 53* 06/03/2010 0435   LDLCALC  06/03/2010 0435    39        Total Cholesterol/HDL:CHD Risk Coronary Heart Disease Risk Table                     Men   Women  1/2 Average Risk   3.4   3.3  Average Risk       5.0   4.4  2 X Average Risk   9.6   7.1  3 X Average Risk  23.4   11.0        Use the calculated Patient Ratio above and the CHD Risk Table to determine the patient's CHD Risk.        ATP III CLASSIFICATION (LDL):  <100     mg/dL   Optimal  297-989  mg/dL   Near or Above                    Optimal  130-159  mg/dL   Borderline  211-941  mg/dL   High  >740     mg/dL   Very High         Assessment & Plan:  Depression: Secondary to multiple underlying social circumstances He informs me he is currently undergoing therapy with a psychiatrist at [place] but is not on pharmacotherapy at this time. No indications of suicidal ideations or intentions.   Coronary artery disease : S/p multiple PCI No angina at this time  Aggressive risk factor modification including smoking cessation. Continue Ranexa, Plavix Keep appointment with cardiology   Hypertension: Uncontrolled due to pain Continue antihypertensives Lifestyle modification, low sodium, DASH diet   Diabetes mellitus: Controlled with A1c of 5.8 Continue metformin. 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 Referred to [facility] as per patient request.  GERD: Controlled on Nexium   This note has been created with Education officer, environmental. Any transcriptional errors are unintentional.

## 2016-06-03 ENCOUNTER — Encounter: Payer: Self-pay | Admitting: Family Medicine

## 2016-06-07 ENCOUNTER — Telehealth: Payer: Self-pay | Admitting: Family Medicine

## 2016-06-07 NOTE — Telephone Encounter (Signed)
Patient called wanting Tylenol 3. Please follow up.

## 2016-06-07 NOTE — Telephone Encounter (Signed)
Patient called wanting to speak with the doctor. Patient stated that they will call again later. Please follow up.

## 2016-06-08 NOTE — Telephone Encounter (Signed)
He received 60 tablets of tramadol at his last visit on 05/31/16. Will only receive pain medications in one month from that date.

## 2016-06-08 NOTE — Telephone Encounter (Signed)
Patient information verified per [facility] protocol.  Rn advised patient per Dr. Jarold Song: he received 60 tablets of tramadol at his last visit on 05/31/16. Will only receive pain medications in a one month from that date. Patient verbalized understanding.  Priscille Heidelberg, RN, BSN

## 2016-06-17 ENCOUNTER — Telehealth: Payer: Self-pay | Admitting: Internal Medicine

## 2016-06-17 ENCOUNTER — Telehealth: Payer: Self-pay | Admitting: Cardiology

## 2016-06-17 ENCOUNTER — Emergency Department (HOSPITAL_COMMUNITY)
Admission: EM | Admit: 2016-06-17 | Discharge: 2016-06-18 | Disposition: A | Discharge status: Home / Self Care | Payer: BC Managed Care – PPO | Attending: Emergency Medicine | Admitting: Emergency Medicine

## 2016-06-17 ENCOUNTER — Emergency Department (HOSPITAL_COMMUNITY): Payer: BC Managed Care – PPO

## 2016-06-17 ENCOUNTER — Encounter (HOSPITAL_COMMUNITY): Payer: Self-pay | Admitting: *Deleted

## 2016-06-17 DIAGNOSIS — Z87891 Personal history of nicotine dependence: Secondary | ICD-10-CM | POA: Diagnosis not present

## 2016-06-17 DIAGNOSIS — R0602 Shortness of breath: Secondary | ICD-10-CM | POA: Diagnosis not present

## 2016-06-17 DIAGNOSIS — Z5321 Procedure and treatment not carried out due to patient leaving prior to being seen by health care provider: Secondary | ICD-10-CM | POA: Insufficient documentation

## 2016-06-17 DIAGNOSIS — Z955 Presence of coronary angioplasty implant and graft: Secondary | ICD-10-CM | POA: Insufficient documentation

## 2016-06-17 DIAGNOSIS — R079 Chest pain, unspecified: Secondary | ICD-10-CM | POA: Insufficient documentation

## 2016-06-17 LAB — BASIC METABOLIC PANEL
ANION GAP: 5 (ref 5–15)
BUN: 10 mg/dL (ref 6–20)
CO2: 27 mmol/L (ref 22–32)
Calcium: 9 mg/dL (ref 8.9–10.3)
Chloride: 109 mmol/L (ref 101–111)
Creatinine, Ser: 0.85 mg/dL (ref 0.61–1.24)
GFR calc Af Amer: >60 mL/min (ref >60)
GFR calc non Af Amer: >60 mL/min (ref >60)
GLUCOSE: 114 mg/dL — AB (ref 65–99)
POTASSIUM: 3.5 mmol/L (ref 3.5–5.1)
Sodium: 141 mmol/L (ref 135–145)

## 2016-06-17 LAB — I-STAT TROPONIN, ED: Troponin i, poc: 0 ng/mL (ref 0.00–0.08)

## 2016-06-17 LAB — CBC
HEMATOCRIT: 42.3 % (ref 39.0–52.0)
HEMOGLOBIN: 14 g/dL (ref 13.0–17.0)
MCH: 27.3 pg (ref 26.0–34.0)
MCHC: 33.1 g/dL (ref 30.0–36.0)
MCV: 82.5 fL (ref 78.0–100.0)
Platelets: 232 10*3/uL (ref 150–400)
RBC: 5.13 MIL/uL (ref 4.22–5.81)
RDW: 13.7 % (ref 11.5–15.5)
WBC: 7.5 10*3/uL (ref 4.0–10.5)

## 2016-06-17 NOTE — Telephone Encounter (Signed)
Spoke with pt, since yesterday he has been nauseated and felt sick. He feels like he may pass out and bp by his report is 109/91. He c/o left arm going numb about every hour and also cp and SOB. He has taken 1 NTG and it relieved the cp. He has been resting, has not been active since this started. He describes the chest pain as a sharpe pain on the left side at his heart. On scale 1 to 10 he rates his pain as 8.5. He denies belching, vomiting or diarrhea. He was advised to go to [place] for evaluation. He reports he was going to the hosp but was calling us first. advised pt not to drive. Patient voiced understanding

## 2016-06-17 NOTE — Telephone Encounter (Signed)
Spoke with pt, he reports he is going to another ER. Explained his ECG and lab work was normal. He states he is still going to the er.

## 2016-06-17 NOTE — ED Triage Notes (Signed)
Pt reports mid chest pain since yesterday and having sob. Denies nausea. Has cardiac history. Airway intact at triage, ekg done.

## 2016-06-17 NOTE — Telephone Encounter (Signed)
New Message  Pt c/o PRE Syncope: STAT if syncope occurred within 30 minutes and pt complains of lightheadedness High Priority if episode of passing out, completely, today or in last 24 hours   1. Did you pass out today? Pt states feel like he is going to pass out   2. When is the last time you passed out? Pt states he has not passed out    3. Did you have any symptoms prior to passing out? Pt states hehas not passed out but feels like he could. Pt states is having heavy sweating, and sob please call back to discuss

## 2016-06-17 NOTE — Telephone Encounter (Signed)
F/u  Pt calling to speak w/ RN- stated that he has been waiting in ED since 12pm has not been seen and has left. Please call back and discuss.

## 2016-06-17 NOTE — ED Notes (Signed)
Pt left ama 

## 2016-06-22 ENCOUNTER — Other Ambulatory Visit: Payer: Self-pay

## 2016-06-22 DIAGNOSIS — Z955 Presence of coronary angioplasty implant and graft: Secondary | ICD-10-CM

## 2016-06-22 MED ORDER — ISOSORBIDE MONONITRATE ER 30 MG PO TB24: 60.0000 mg | ORAL_TABLET | Freq: Two times a day (BID) | ORAL | 3 refills | Status: DC

## 2016-06-28 ENCOUNTER — Encounter: Payer: Self-pay | Admitting: Internal Medicine

## 2016-06-28 ENCOUNTER — Telehealth: Payer: Self-pay | Admitting: Internal Medicine

## 2016-06-28 ENCOUNTER — Ambulatory Visit (INDEPENDENT_AMBULATORY_CARE_PROVIDER_SITE_OTHER): Payer: BC Managed Care – PPO | Admitting: Internal Medicine

## 2016-06-28 VITALS — BP 147/96 | HR 81 | Ht 68.0 in | Wt 261.4 lb

## 2016-06-28 DIAGNOSIS — Z955 Presence of coronary angioplasty implant and graft: Secondary | ICD-10-CM

## 2016-06-28 DIAGNOSIS — Z9861 Coronary angioplasty status: Secondary | ICD-10-CM

## 2016-06-28 DIAGNOSIS — R079 Chest pain, unspecified: Secondary | ICD-10-CM | POA: Diagnosis not present

## 2016-06-28 DIAGNOSIS — I25118 Atherosclerotic heart disease of native coronary artery with other forms of angina pectoris: Secondary | ICD-10-CM

## 2016-06-28 DIAGNOSIS — I2510 Atherosclerotic heart disease of native coronary artery without angina pectoris: Secondary | ICD-10-CM

## 2016-06-28 DIAGNOSIS — F419 Anxiety disorder, unspecified: Secondary | ICD-10-CM | POA: Diagnosis not present

## 2016-06-28 DIAGNOSIS — I25119 Atherosclerotic heart disease of native coronary artery with unspecified angina pectoris: Secondary | ICD-10-CM | POA: Insufficient documentation

## 2016-06-28 NOTE — Progress Notes (Signed)
OFFICE NOTE  Chief Complaint:  Recurrent chest pain, insomnia, anxiety  Primary Care Physician: Arnoldo Morale, MD  HPI:  Andrew West  is a 50 year old gentleman with a history of coronary artery disease and stent placement to the circumflex and obtuse marginal and a history of in stent restenosis to the LAD. There is also a nondominant right coronary artery that is 100% occluded, filled with collaterals. He did have a stress test recently, which showed an EF of 53% and reversible septal ischemia in the setting of chest pain and after much convincing underwent cardiac catheterization.  He then underwent cutting balloon angioplasty to an ostial lateral OM1 branch and the bifurcation AV groove circumflex OM junction. This was successful at reducing the stenosis to 0%. This was in November 2012 and he did not return for followup appointment until April 2013. He was suffering from low back pain and has been evaluated and treated by Dr. Trenton Gammon with a laminectomy/discectomy, which he says was not helpful. Otherwise, he continues to smoke and occasionally complains of shortness of breath and some chest pain symptoms which are atypical. He is on long-acting and/or has not needed to take short-acting nitroglycerin. His other concern today is that he is having problems with his teeth and was recommended to have edentulation and by an oral surgeon Dr. Diona Browner. Finally, he is suffering from significant stress and depression due to recent separation with wife in dealing with his kids. This seems to be a big tablets on his continued smoking.  Andrew West returns today in the office. He feels fairly well. He denies any chest pain. He continues to have problems with low back pain and numbness and tingling in his legs. He is apparently status post laminectomy and microdiscectomy by Dr. Trenton Gammon and continues to have symptoms which may be related to that. He's also complaining of what sounds like  neuropathic pain. He is not currently on medication. He is asking for Tylenol 3 for pain today which I told him I did not prescribe. Ultimately there are no further surgical or nonsurgical options, he may need to go on a neuropathic pain medication or perhaps be referred to a pain management specialist. He is also looking for a new primary care provider as his primary care provider is close to retirement. He reports he has cut back his smoking to about 1 pack every 2 weeks. He is also been out of amlodipine and simvastatin although his blood pressure is well controlled.  I saw Andrew West in the office today. He is reporting now complains of shortness of breath and chest pain. He also feels like his breathing is worse when working around chemicals at work. He uses a respiratory protection mask but also feels like he is under significant stress. He's had missed several days of work and is concerned about his job. Sounds like he is in a difficult work environment. He says that he is apologizing for "deceiving me" that he was not honest about his symptoms. He apparently has been having shortness of breath and chest pain for several months and did not relay that to me.  Andrew West returns to the office today for follow-up. His main complaint is shortness of breath. He's not feeling as much chest pain he's had been previously. We recently did a nuclear stress test which was negative for ischemia and showed an EF of 56%. With regards to shortness of breath he's concerned most of this was related to chemicals at  work although he has not worked in several months. He's had about 20 pound weight gain since we last saw him in the office and denies any lower extremity swelling, orthopnea or PND. Shortness of breath is persistent and is associated with some wheezing. He did see Dr. 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 Shortness of breath seems to be a little bit worse with exertion which makes me wonder whether there is an element of exercise-induced pulmonary hypertension. He's not had an echocardiogram in some time.  Andrew West returns today for follow-up. He was noted to have some diastolic dysfunction on his echocardiogram. I started him on low-dose Lasix and check lab work including a BNP which is very low around 50. On follow-up today he reports he feels no different with the addition of Lasix. I therefore asked him to take it as needed. He is also describing worsening substernal chest discomfort and a squeezing pressure in his chest today. He says this is been getting worse over the past several days, more than his typical chest discomfort. As previously noted he recently underwent a nuclear stress test a few months ago which was negative for ischemia.   Andrew West returns for follow-up. As mentioned he had had recent progressive chest pain symptoms and worsening shortness of breath. He underwent another cardiac catheterization which demonstrated the following:   Ost 2nd Mrg to 2nd Mrg lesion, 99% stenosed. Post intervention, 99% residual stenosis remained. The lesion was previously treated with a bare metal stentgreater than two years ago.  Mid Cx-2 lesion, 90% stenosed. Post intervention, there is a 20% residual stenosis.  Mid Cx-1 lesion, 80% stenosed. Post intervention, there is a 50% residual stenosis. The lesion was previously treated with a bare metal stent.  Ramus lesion, 100% stenosed. The lesion was previously treated with a bare metal stentgreater than two years ago.  Prox RCA lesion, 100% stenosed.  There is mild left ventricular systolic dysfunction.  Moderately elevated LVEDP of 20 mmHg   Difficult situation with what amounts to be a totally occluded (In-stent re-stenosis) of the OM2 branch with sub-optimal PTCA of the mid AV-Groove In-stent  restenosis. He continues to have chest pain although reports it somewhat improved. He was started on ranolazine and currently is on 1000 mg twice a day.  Andrew West returns today for follow-up. He reports he is doing fairly well on medical therapy. He still gets some sharp chest pain mostly along the right sternal border. He does get short of breath with moderate exertion. He's trying to do some walking on a treadmill but generally stops the exercise once he gets short of breath because he is very nervous. He is not currently working due to combined cardiac pulmonary and musculoskeletal diseases. He says that he and his wife are going to go on a delayed honeymoon since they never took one.  04/20/2016  Andrew West returns today for follow-up. He was recently seen in the hospital in the beginning of May for chest pain. He reports that it's up in the left neck base around the area of the left clavicle. It's very exquisitely tender to touch and feels sharp and electric. This pain is related we think to cervical pain. He ruled out for MI and was not worked up further for coronary ischemia. This pain is felt to be distinctly different. His  isosorbide was increased up to 60 mg twice a day but he notes no change in his symptoms with that. His primary care providers hesitant to provide any pain medication.  06/28/2016  Andrew West was seen back today in follow-up. He recently was in the ER at [facility] the left without being seen after 3 hours. An EKG was performed and showed no ischemic changes. He reported going home and taking some nitroglycerin and aspirin with some eventual improvement in his symptoms. He continues to have a sharp left chest discomfort which is tender to the touch and is persistent. It's not necessarily worse with exertion or relieved by rest. I felt this may likely be a neuropathic pain and I recommended starting gabapentin 300 mg daily at bedtime. He reports no improvement with this medication. I  advised him to discontinue today. He's also concerned about poor sleep at night, significant preoccupation with death and anxiety. This is concerning for possible PTSD type symptoms.  PMHx:  Past Medical History:  Diagnosis Date  . CAD (coronary artery disease)    2002; treated with stent to mid L circumflex  . Chronic back pain   . Chronic leg pain    bilateral  . Depression   . Diabetes mellitus   . GERD (gastroesophageal reflux disease)    Takes Dexilant  . HLD (hyperlipidemia)   . Hypertension   . Rhabdomyolysis    h/o, r/t statins    Past Surgical History:  Procedure Laterality Date  . CARDIAC CATHETERIZATION N/A 09/25/2015   Procedure: Left Heart Cath and Coronary Angiography;  Surgeon: Leonie Man, MD;  Location: [facility];  Service: Cardiovascular;  Laterality: N/A;  . COLONOSCOPY W/ POLYPECTOMY    . CORONARY ANGIOPLASTY  09/25/2015   mid cir & om  . CORONARY ANGIOPLASTY WITH STENT PLACEMENT  10/09/2001   PTCA & stenting of mid AV circumflex; 2.5x9m Pixel stent  . CORONARY ANGIOPLASTY WITH STENT PLACEMENT  12/13/2001   PCI with stent to mid L circumflex, 95% stenosis to 0% residual  . CORONARY ANGIOPLASTY WITH STENT PLACEMENT  10/10/2003   PCI to mid AV circumflex; LAD 30% disease; RCA 100% occluded prox.  . CORONARY ANGIOPLASTY WITH STENT PLACEMENT  09/01/2011   PCI with stenting with bare metal stent to mid AV groove circumflex and PDA  . CORONARY ANGIOPLASTY WITH STENT PLACEMENT  10/17/2011   cutting balloon angioplasty of ostial lateral OM1 branch and bifurcation AV groove circumflex OM junction; stenosis reduced to 0%  . LEFT HEART CATHETERIZATION WITH CORONARY ANGIOGRAM N/A 10/18/2011   Procedure: LEFT HEART CATHETERIZATION WITH CORONARY ANGIOGRAM;  Surgeon: DLeonie Man MD;  [facility];  Service: Cardiovascular;  Laterality: N/A;  . LUMBAR LAMINECTOMY/DECOMPRESSION MICRODISCECTOMY  03/31/2012   Procedure: LUMBAR  LAMINECTOMY/DECOMPRESSION MICRODISCECTOMY 1 LEVEL;  Surgeon: HCharlie Pitter MD;  [facility];  Service: Neurosurgery;  Laterality: Left;  . TRANSTHORACIC ECHOCARDIOGRAM  07/28/2011   EF 55-65%; LVH, grade 1 diastolic dysfunction;     FAMHx:  Family History  Problem Relation Age of Onset  . Heart attack Father   . Hypertension Mother   . Diabetes Mother   . Heart disease Brother     x 3   . Heart attack Brother     deceased  . Hypertension Sister   . Diabetes Sister   . Anesthesia problems Neg Hx   . Hypotension Neg Hx   . Malignant hyperthermia Neg Hx   . Pseudochol deficiency Neg  Hx     SOCHx:   reports that he quit smoking about 13 months ago. His smoking use included Cigarettes. He has a 6.25 pack-year smoking history. He has never used smokeless tobacco. He reports that he does not drink alcohol or use drugs.  ALLERGIES:  Allergies  Allergen Reactions  . Iohexol     PT. TO BE PREMEDICATED PRIOR TO IV CONTRAST PER DR Kris Hartmann /MMS//12/15/15Desc: PT BECAME SOB AND CHEST TIGHTNESS AFTER CONTRAST INJECTION.  [name],RT-RCT., Onset Date: [id number]   . Vicodin [Hydrocodone-Acetaminophen] Itching and Rash    ROS: Pertinent items noted in HPI and remainder of comprehensive ROS otherwise negative.  HOME MEDS: Current Outpatient Prescriptions  Medication Sig Dispense Refill  . acetaminophen (TYLENOL) 500 MG tablet Take 1,000 mg by mouth every 6 (six) hours as needed for headache.    . Albuterol Sulfate (PROAIR RESPICLICK) 947 (90 Base) MCG/ACT AEPB Inhale 1 puff into the lungs every 6 (six) hours as needed (shortness of breath). 1 each 2  . aspirin 81 MG tablet Take 1 tablet (81 mg total) by mouth daily.    . clopidogrel (PLAVIX) 75 MG tablet Take 1 tablet (75 mg total) by mouth daily. 90 tablet 3  . furosemide (LASIX) 20 MG tablet Take 1 tablet (20 mg total) by mouth daily as needed for fluid or edema. 30 tablet 2  . gabapentin (NEURONTIN) 300 MG capsule Take 1  capsule (300 mg total) by mouth at bedtime. (Patient taking differently: Take 300 mg by mouth 2 (two) times daily. ) 30 capsule 5  . isosorbide mononitrate (IMDUR) 30 MG 24 hr tablet Take 2 tablets (60 mg total) by mouth 2 (two) times daily. 60 tablet 3  . meloxicam (MOBIC) 7.5 MG tablet Take 1 tablet (7.5 mg total) by mouth daily. 30 tablet 0  . metFORMIN (GLUCOPHAGE) 1000 MG tablet Take 1 tablet (1,000 mg total) by mouth 2 (two) times daily with a meal. 180 tablet 1  . metoprolol (LOPRESSOR) 100 MG tablet Take 1 tablet (100 mg total) by mouth 2 (two) times daily. 60 tablet 2  . mometasone (ASMANEX) 220 MCG/INH inhaler Inhale 2 puffs into the lungs daily. 1 Inhaler 2  . nitroGLYCERIN (NITROSTAT) 0.4 MG SL tablet Place 1 tablet (0.4 mg total) under the tongue every 5 (five) minutes as needed for chest pain. 25 tablet 11  . pantoprazole (PROTONIX) 40 MG tablet Take 1 tablet (40 mg total) by mouth daily. 30 tablet 11  . ranolazine (RANEXA) 1000 MG SR tablet Take 1 tablet (1,000 mg total) by mouth 2 (two) times daily. 180 tablet 3  . simvastatin (ZOCOR) 20 MG tablet Take 1 tablet (20 mg total) by mouth at bedtime. 90 tablet 3  . traMADol (ULTRAM) 50 MG tablet Take 1 tablet (50 mg total) by mouth daily. Reported on 05/31/2016 60 tablet 1  . valsartan-hydrochlorothiazide (DIOVAN HCT) 320-25 MG tablet Take 1 tablet by mouth daily. 90 tablet 1   No current facility-administered medications for this visit.     LABS/IMAGING: No results found for this or any previous visit (from the past 48 hour(s)). No results found.  VITALS: BP (!) 147/96   Pulse 81   Ht '5\' 8"'$  (1.727 m)   Wt 261 lb 6.4 oz (118.6 kg)   BMI 39.75 kg/m   EXAM: General appearance: alert, no distress and morbidly obese Lungs: clear to auscultation bilaterally Heart: regular rate and rhythm, S1, S2 normal, no murmur, click, rub or gallop Extremities: extremities normal,  atraumatic, no cyanosis or edema  EKG: Deferred (Reviewed  hospital EKG from 06/17/2016)  ASSESSMENT:  1. Chest pain and dyspnea on exertion - Occluded OM2 at a prior stent - no good revascularization options 2. Coronary artery disease status post PCI and cutting balloon angioplasty over 2 years ago - negative Lexiscan for ischemia 3. Ongoing tobacco abuse 4. Hypertension 5. Dyslipidemia 6. Neuropathy 7. Persistent low back pain 8. Diabetes type 2 9. Morbid obesity 10. Anxiety/?PTSD  PLAN: 1.   Andrew West continues to have ongoing symptoms of sharp left sided chest pain. This was not improved with gabapentin. I recommended discontinuing that today. He is requesting another stress test or heart catheterization. It's been 1 year since his last stress test and catheterization was in November without any good right revascularization options. I will recommend a repeat stress test and if there is any significant reversible ischemia, he will be referred for cath and otherwise, I like to refer him to behavioral health for evaluation of his anxiety and possible PTSD symptoms. He may benefit from a combination antidepressant/neuropathic pain medicine such as Cymbalta.  Pixie Casino, MD, [facility] Attending Cardiologist [place] Janeshia Ciliberto 06/28/2016, 8:53 AM

## 2016-06-28 NOTE — Patient Instructions (Signed)
Your physician has requested that you have a lexiscan myoview. For further information please visit [email or web address]. Please follow instruction sheet, as given.  Dr. Debara Pickett has referred you to [facility]   -- the office is located at [address] -- phone #: [phone] -- they will contact you to arrange an appointment  Your physician has recommended you make the following change in your medication: STOP gabapentin   Your physician recommends that you schedule a follow-up appointment with Dr. Debara Pickett after your stress test.

## 2016-06-28 NOTE — Telephone Encounter (Signed)
Faxed referral to [facility] for [place] Referral per MD office note 06/28/16

## 2016-06-29 ENCOUNTER — Encounter (HOSPITAL_COMMUNITY): Payer: Self-pay

## 2016-06-29 ENCOUNTER — Encounter (HOSPITAL_COMMUNITY): Payer: Self-pay | Admitting: Emergency Medicine

## 2016-06-29 ENCOUNTER — Emergency Department (HOSPITAL_COMMUNITY)
Admission: EM | Admit: 2016-06-29 | Discharge: 2016-06-29 | Disposition: A | Discharge status: Home / Self Care | Payer: BC Managed Care – PPO | Attending: Emergency Medicine | Admitting: Emergency Medicine

## 2016-06-29 DIAGNOSIS — Z9861 Coronary angioplasty status: Secondary | ICD-10-CM | POA: Diagnosis not present

## 2016-06-29 DIAGNOSIS — J449 Chronic obstructive pulmonary disease, unspecified: Secondary | ICD-10-CM | POA: Insufficient documentation

## 2016-06-29 DIAGNOSIS — Z046 Encounter for general psychiatric examination, requested by authority: Secondary | ICD-10-CM | POA: Diagnosis present

## 2016-06-29 DIAGNOSIS — F329 Major depressive disorder, single episode, unspecified: Secondary | ICD-10-CM | POA: Diagnosis not present

## 2016-06-29 DIAGNOSIS — Z7984 Long term (current) use of oral hypoglycemic drugs: Secondary | ICD-10-CM | POA: Diagnosis not present

## 2016-06-29 DIAGNOSIS — Z791 Long term (current) use of non-steroidal anti-inflammatories (NSAID): Secondary | ICD-10-CM | POA: Insufficient documentation

## 2016-06-29 DIAGNOSIS — Z87891 Personal history of nicotine dependence: Secondary | ICD-10-CM | POA: Diagnosis not present

## 2016-06-29 DIAGNOSIS — Z79899 Other long term (current) drug therapy: Secondary | ICD-10-CM | POA: Insufficient documentation

## 2016-06-29 DIAGNOSIS — E119 Type 2 diabetes mellitus without complications: Secondary | ICD-10-CM | POA: Insufficient documentation

## 2016-06-29 DIAGNOSIS — F32A Depression, unspecified: Secondary | ICD-10-CM

## 2016-06-29 DIAGNOSIS — J45909 Unspecified asthma, uncomplicated: Secondary | ICD-10-CM | POA: Insufficient documentation

## 2016-06-29 DIAGNOSIS — I10 Essential (primary) hypertension: Secondary | ICD-10-CM | POA: Insufficient documentation

## 2016-06-29 DIAGNOSIS — F419 Anxiety disorder, unspecified: Secondary | ICD-10-CM | POA: Diagnosis not present

## 2016-06-29 DIAGNOSIS — I2510 Atherosclerotic heart disease of native coronary artery without angina pectoris: Secondary | ICD-10-CM | POA: Diagnosis not present

## 2016-06-29 DIAGNOSIS — Z7982 Long term (current) use of aspirin: Secondary | ICD-10-CM | POA: Diagnosis not present

## 2016-06-29 NOTE — Patient Instructions (Signed)
PT to go to [facility] for evaluation.

## 2016-06-29 NOTE — ED Provider Notes (Signed)
[facility] DEPT Provider Note   CSN: [id number] Arrival date & time: 06/29/16  1347  First Provider Contact:  First MD Initiated Contact with Patient 06/29/16 1531        History   Chief Complaint Chief Complaint  Patient presents with  . Medical Clearance    HPI Andrew West is a 50 y.o. male.  Patient presents to the ED with a chief complaint of depression and anxiety.  He states that he has been under a lot of pressure.  States that he is having difficulty sleeping.  States that his symptoms started after losing his wife.  He states that he was seen by his cardiologist yesterday for chronic ongoing chest pain, and was told by the cardiologist that he may benefit from speaking to a therapist or starting some medication for anxiety.  He states that he came here to get more information.  He states that he would like to follow-up with someone on an outpatient basis.  He adamantly denies any suicidal or homicidal ideation.  He states that he has had some recent night nightmares in which he saw himself in a casket.  He clarifies that these were dreams and not hallucinations.  He denies any other new or associated symptoms.  His chest pain is followed closely by his cardiologist. The pain is constant and ongoing.  It is reproducible to palpation.   There are no other associated symptoms.   The history is provided by the patient. No language interpreter was used.    Past Medical History:  Diagnosis Date  . CAD (coronary artery disease)    2002; treated with stent to mid L circumflex  . Chronic back pain   . Chronic leg pain    bilateral  . Depression   . Diabetes mellitus   . GERD (gastroesophageal reflux disease)    Takes Dexilant  . HLD (hyperlipidemia)   . Hypertension   . Rhabdomyolysis    h/o, r/t statins    Patient Active Problem List   Diagnosis Date Noted  . Anxiety 06/28/2016  . Coronary artery disease involving native coronary artery 06/28/2016  . Diastolic  dysfunction-grade 2 with EF 60-65% Oct 2016 03/22/2016  . Hypertension 10/03/2015  . CAD S/P multiple PCI's 10/03/2015  . 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 Pulmonary nodule 06/24/2015  . Cough 06/24/2015  . 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 Reactive airway disease 06/04/2015  . DOE (dyspnea on exertion) 04/15/2015  . Lumbar disc herniation with radiculopathy 03/31/2012  . NSTEMI (non-ST elevated myocardial infarction) ([place]) 10/18/2011  . Presence of stent in left circumflex coronary artery 10/18/2011    Class: History of  . TOBACCO ABUSE 02/27/2009  . HEMATOCHEZIA 12/30/2008  . GROSS HEMATURIA 12/30/2008  . ABDOMINAL PAIN, LEFT LOWER QUADRANT 12/30/2008  . ABDOMINAL PAIN, EPIGASTRIC 12/05/2008  . Depression 09/05/2008  . Hereditary and idiopathic peripheral neuropathy 09/05/2008  . GERD 09/05/2008  . Cervical disc disorder with radiculopathy of cervical region 08/19/2008  . HLD (hyperlipidemia) 04/25/2008    Class: Diagnosis of  . MYOCARDIAL INFARCTION, HX OF 04/25/2008  . ALLERGIC RHINITIS 04/25/2008  . Backache 04/25/2008  . Chest pain, unspecified 04/25/2008  . COLONIC POLYPS, HX OF 04/25/2008  . HYPERCHOLESTEROLEMIA 04/18/2008  . Obesity 04/18/2008    Class: Diagnosis of  . Essential hypertension 04/18/2008    Class: Diagnosis of  . ANAL FISSURE, HX OF 04/18/2008  . Diabetes mellitus ([place]) 01/30/2008    Class: History of  . RECTAL BLEEDING  01/30/2008    Past Surgical History:  Procedure Laterality Date  . CARDIAC CATHETERIZATION N/A 09/25/2015   Procedure: Left Heart Cath and Coronary Angiography;  Surgeon: Leonie Man, MD;  Location: [facility];  Service: Cardiovascular;  Laterality: N/A;  . COLONOSCOPY W/ POLYPECTOMY    . CORONARY ANGIOPLASTY  09/25/2015   mid cir & om  . CORONARY ANGIOPLASTY WITH STENT PLACEMENT  10/09/2001   PTCA & stenting of mid AV circumflex; 2.5x26m Pixel stent  . CORONARY ANGIOPLASTY WITH STENT PLACEMENT  12/13/2001    PCI with stent to mid L circumflex, 95% stenosis to 0% residual  . CORONARY ANGIOPLASTY WITH STENT PLACEMENT  10/10/2003   PCI to mid AV circumflex; LAD 30% disease; RCA 100% occluded prox.  . CORONARY ANGIOPLASTY WITH STENT PLACEMENT  09/01/2011   PCI with stenting with bare metal stent to mid AV groove circumflex and PDA  . CORONARY ANGIOPLASTY WITH STENT PLACEMENT  10/17/2011   cutting balloon angioplasty of ostial lateral OM1 branch and bifurcation AV groove circumflex OM junction; stenosis reduced to 0%  . LEFT HEART CATHETERIZATION WITH CORONARY ANGIOGRAM N/A 10/18/2011   Procedure: LEFT HEART CATHETERIZATION WITH CORONARY ANGIOGRAM;  Surgeon: DLeonie Man MD;  [facility];  Service: Cardiovascular;  Laterality: N/A;  . LUMBAR LAMINECTOMY/DECOMPRESSION MICRODISCECTOMY  03/31/2012   Procedure: LUMBAR LAMINECTOMY/DECOMPRESSION MICRODISCECTOMY 1 LEVEL;  Surgeon: HCharlie Pitter MD;  [facility];  Service: Neurosurgery;  Laterality: Left;  . TRANSTHORACIC ECHOCARDIOGRAM  07/28/2011   EF 55-65%; LVH, grade 1 diastolic dysfunction;     OB History    No data available       Home Medications    Prior to Admission medications   Medication Sig Start Date End Date Taking? Authorizing Provider  acetaminophen (TYLENOL) 500 MG tablet Take 1,000 mg by mouth every 6 (six) hours as needed for headache.   Yes Historical Provider, MD  Albuterol Sulfate (PROAIR RESPICLICK) 1220(90 Base) MCG/ACT AEPB Inhale 1 puff into the lungs every 6 (six) hours as needed (shortness of breath). 02/25/16  Yes EArnoldo Morale MD  aspirin 81 MG tablet Take 1 tablet (81 mg total) by mouth daily. 03/23/16  Yes KEileen Stanford PA-C  clopidogrel (PLAVIX) 75 MG tablet Take 1 tablet (75 mg total) by mouth daily. 01/19/16  Yes KPixie Casino MD  furosemide (LASIX) 20 MG tablet Take 1 tablet (20 mg total) by mouth daily as needed for fluid or edema. 02/25/16  Yes EArnoldo Morale MD  isosorbide mononitrate  (IMDUR) 30 MG 24 hr tablet Take 2 tablets (60 mg total) by mouth 2 (two) times daily. 06/22/16  Yes KPixie Casino MD  meloxicam (MOBIC) 7.5 MG tablet Take 1 tablet (7.5 mg total) by mouth daily. 04/23/16  Yes EArnoldo Morale MD  metFORMIN (GLUCOPHAGE) 1000 MG tablet Take 1 tablet (1,000 mg total) by mouth 2 (two) times daily with a meal. 04/15/16  Yes EArnoldo Morale MD  metoprolol (LOPRESSOR) 100 MG tablet Take 1 tablet (100 mg total) by mouth 2 (two) times daily. 02/25/16  Yes EArnoldo Morale MD  mometasone (ASMANEX) 220 MCG/INH inhaler Inhale 2 puffs into the lungs daily. 02/25/16  Yes EArnoldo Morale MD  nitroGLYCERIN (NITROSTAT) 0.4 MG SL tablet Place 1 tablet (0.4 mg total) under the tongue every 5 (five) minutes as needed for chest pain. 09/26/15  Yes KEileen Stanford PA-C  pantoprazole (PROTONIX) 40 MG tablet Take 1 tablet (40 mg total) by mouth daily.  02/25/16  Yes Arnoldo Morale, MD  ranolazine (RANEXA) 1000 MG SR tablet Take 1 tablet (1,000 mg total) by mouth 2 (two) times daily. 03/29/16  Yes Pixie Casino, MD  simvastatin (ZOCOR) 20 MG tablet Take 1 tablet (20 mg total) by mouth at bedtime. Patient taking differently: Take 20 mg by mouth daily.  02/25/16  Yes Arnoldo Morale, MD  valsartan-hydrochlorothiazide (DIOVAN HCT) 320-25 MG tablet Take 1 tablet by mouth daily. 04/15/16  Yes Arnoldo Morale, MD  traMADol (ULTRAM) 50 MG tablet Take 1 tablet (50 mg total) by mouth daily. Reported on 05/31/2016 Patient not taking: Reported on 06/29/2016 05/31/16   Arnoldo Morale, MD    Family History Family History  Problem Relation Age of Onset  . Heart attack Father   . Hypertension Mother   . Diabetes Mother   . Heart disease Brother     x 3   . Heart attack Brother     deceased  . Hypertension Sister   . Diabetes Sister   . Anesthesia problems Neg Hx   . Hypotension Neg Hx   . Malignant hyperthermia Neg Hx   . Pseudochol deficiency Neg Hx     Social History Social History  Substance Use Topics  . Smoking  status: Former Smoker    Packs/day: 0.25    Years: 25.00    Types: Cigarettes    Quit date: 05/24/2015  . Smokeless tobacco: Never Used  . Alcohol use No     Allergies   Iohexol and Vicodin [hydrocodone-acetaminophen]   Review of Systems Review of Systems  All other systems reviewed and are negative.    Physical Exam Updated Vital Signs BP 135/84   Pulse 80   Temp 98.8 F (37.1 C) (Oral)   Resp 18   Ht '5\' 8"'$  (1.727 m)   Wt 114.3 kg   SpO2 96%   BMI 38.32 kg/m   Physical Exam  Constitutional: He is oriented to person, place, and time. He appears well-developed and well-nourished.  HENT:  Head: Normocephalic and atraumatic.  Eyes: Conjunctivae and EOM are normal. Pupils are equal, round, and reactive to light. Right eye exhibits no discharge. Left eye exhibits no discharge. No scleral icterus.  Neck: Normal range of motion. Neck supple. No JVD present.  Cardiovascular: Normal rate, regular rhythm and normal heart sounds.  Exam reveals no gallop and no friction rub.   No murmur heard. Pulmonary/Chest: Effort normal and breath sounds normal. No respiratory distress. He has no wheezes. He has no rales. He exhibits no tenderness.  Abdominal: Soft. He exhibits no distension and no mass. There is no tenderness. There is no rebound and no guarding.  Musculoskeletal: Normal range of motion. He exhibits no edema or tenderness.  Neurological: He is alert and oriented to person, place, and time.  Skin: Skin is warm and dry.  Psychiatric: He has a normal mood and affect. His behavior is normal. Judgment and thought content normal.  Nursing note and vitals reviewed.    ED Treatments / Results  Labs (all labs ordered are listed, but only abnormal results are displayed) Labs Reviewed - No data to display  EKG  EKG Interpretation None       Radiology No results found.  Procedures Procedures (including critical care time)  Medications Ordered in ED Medications - No  data to display   Initial Impression / Assessment and Plan / ED Course  I have reviewed the triage vital signs and the nursing notes.  Pertinent labs &  imaging results that were available during my care of the patient were reviewed by me and considered in my medical decision making (see chart for details).  Clinical Course    Patient with depression and anxiety symptoms. He is here seeking resources.  He does not want inpatient treatment.  In my opinion after spending time with the patient and hearing his story, he is not a danger to himself or others.  He is appropriate for outpatient follow-up.  I have provided him with resources.  Regarding, his chest pain, this is chronic and there have been no new symptoms.  He is followed by cardiology and was seen yesterday.  The pain is reproducible to palpation.  The reason the patient came to the ER today was to obtain resources for depression and anxiety.  Final Clinical Impressions(s) / ED Diagnoses   Final diagnoses:  Depression  Anxiety    New Prescriptions New Prescriptions   No medications on file     Montine Circle, PA-C 06/29/16 1557    Leonard Schwartz, MD 06/30/16 1339

## 2016-06-29 NOTE — ED Triage Notes (Signed)
Pt sent to ED by cardiologist for psychiatric evaluation for hallucinations of himself in a casket, depression symptoms x several months. Patient denies and SI/HI. Had one suicide attempt 6 months ago after his wife died but states he has "too much to live for" now and would not harm himself.

## 2016-07-02 ENCOUNTER — Telehealth (HOSPITAL_COMMUNITY): Payer: Self-pay

## 2016-07-02 NOTE — Telephone Encounter (Signed)
Encounter complete. 

## 2016-07-05 ENCOUNTER — Ambulatory Visit (INDEPENDENT_AMBULATORY_CARE_PROVIDER_SITE_OTHER)
Admission: RE | Admit: 2016-07-05 | Discharge: 2016-07-05 | Disposition: A | Discharge status: Home / Self Care | Payer: BC Managed Care – PPO | Source: Ambulatory Visit | Attending: Pulmonary Disease | Admitting: Pulmonary Disease

## 2016-07-05 ENCOUNTER — Inpatient Hospital Stay: Admission: RE | Admit: 2016-07-05 | Payer: Self-pay | Source: Ambulatory Visit

## 2016-07-05 DIAGNOSIS — R911 Solitary pulmonary nodule: Secondary | ICD-10-CM | POA: Diagnosis not present

## 2016-07-07 ENCOUNTER — Ambulatory Visit (HOSPITAL_COMMUNITY)
Admission: RE | Admit: 2016-07-07 | Discharge: 2016-07-07 | Disposition: A | Discharge status: Home / Self Care | Payer: BC Managed Care – PPO | Source: Ambulatory Visit | Attending: Cardiovascular Disease | Admitting: Cardiovascular Disease

## 2016-07-07 DIAGNOSIS — Z8249 Family history of ischemic heart disease and other diseases of the circulatory system: Secondary | ICD-10-CM | POA: Diagnosis not present

## 2016-07-07 DIAGNOSIS — Z6839 Body mass index (BMI) 39.0-39.9, adult: Secondary | ICD-10-CM | POA: Insufficient documentation

## 2016-07-07 DIAGNOSIS — R0602 Shortness of breath: Secondary | ICD-10-CM | POA: Insufficient documentation

## 2016-07-07 DIAGNOSIS — I10 Essential (primary) hypertension: Secondary | ICD-10-CM | POA: Diagnosis not present

## 2016-07-07 DIAGNOSIS — Z87891 Personal history of nicotine dependence: Secondary | ICD-10-CM | POA: Insufficient documentation

## 2016-07-07 DIAGNOSIS — R079 Chest pain, unspecified: Secondary | ICD-10-CM

## 2016-07-07 DIAGNOSIS — E663 Overweight: Secondary | ICD-10-CM | POA: Diagnosis not present

## 2016-07-07 DIAGNOSIS — E119 Type 2 diabetes mellitus without complications: Secondary | ICD-10-CM | POA: Insufficient documentation

## 2016-07-07 LAB — MYOCARDIAL PERFUSION IMAGING
CHL CUP NUCLEAR SSS: 10
CHL CUP RESTING HR STRESS: 86 {beats}/min
CSEPPHR: 106 {beats}/min
LV sys vol: 138 mL
LVDIAVOL: 63 mL (ref 62–150)
SDS: 6
SRS: 4
TID: 1.21

## 2016-07-07 MED ORDER — REGADENOSON 0.4 MG/5ML IV SOLN
0.4000 mg | Freq: Once | INTRAVENOUS | Status: AC
  Administered 2016-07-07: 0.4 mg via INTRAVENOUS

## 2016-07-07 MED ORDER — AMINOPHYLLINE 25 MG/ML IV SOLN
100.0000 mg | Freq: Once | INTRAVENOUS | Status: AC
  Administered 2016-07-07: 100 mg via INTRAVENOUS

## 2016-07-07 MED ORDER — TECHNETIUM TC 99M TETROFOSMIN IV KIT
10.0000 | PACK | Freq: Once | INTRAVENOUS | Status: AC | PRN
  Administered 2016-07-07: 10 via INTRAVENOUS
  Filled 2016-07-07: qty 10

## 2016-07-07 MED ORDER — TECHNETIUM TC 99M TETROFOSMIN IV KIT
31.0000 | PACK | Freq: Once | INTRAVENOUS | Status: AC | PRN
  Administered 2016-07-07: 31 via INTRAVENOUS
  Filled 2016-07-07: qty 31

## 2016-07-16 ENCOUNTER — Ambulatory Visit: Payer: BC Managed Care – PPO | Admitting: Internal Medicine

## 2016-07-20 ENCOUNTER — Ambulatory Visit: Payer: BC Managed Care – PPO | Admitting: Internal Medicine

## 2016-07-22 MED FILL — ISOSORBIDE MN ER 30 MG TAB: 30 | 30 days supply | Qty: 120 | Fill #1

## 2016-07-22 MED FILL — VALSARTAN-HCTZ 320-25 MG TA: 320-25 | 30 days supply | Qty: 30 | Fill #1

## 2016-07-27 MED FILL — METOPROLOL TARTRATE 100 MG: 100 | 30 days supply | Qty: 60 | Fill #2

## 2016-07-27 MED FILL — CLOPIDOGREL 75 MG TABLET: 75 | 90 days supply | Qty: 90 | Fill #3

## 2016-07-27 MED FILL — SIMVASTATIN 20 MG TABLET: 20 | 90 days supply | Qty: 90 | Fill #2

## 2016-07-27 MED FILL — PANTOPRAZOLE SOD DR 40 MG T: 40 | 90 days supply | Qty: 90 | Fill #3

## 2016-08-02 ENCOUNTER — Telehealth: Payer: Self-pay | Admitting: Family Medicine

## 2016-08-02 NOTE — Telephone Encounter (Signed)
Patient called the office to speak with nurse regarding back and leg pain he is experiencing. He needs prescription for pain. I explained to patient that he needed appointment. He wanted to speak with nurse first. Please follow up.  Thank you

## 2016-08-04 NOTE — Telephone Encounter (Signed)
Writer called patient back who states that he has been in bed due to back pain and is requesting pain medication from Dr. Jarold Song.  Writer informed him that Dr. Jarold Song was not here today and would not be back in clinic until Monday.   Patient will go to Urgent Care.

## 2016-08-23 ENCOUNTER — Encounter: Payer: Self-pay | Admitting: Internal Medicine

## 2016-08-23 ENCOUNTER — Ambulatory Visit (INDEPENDENT_AMBULATORY_CARE_PROVIDER_SITE_OTHER): Payer: BC Managed Care – PPO | Admitting: Internal Medicine

## 2016-08-23 VITALS — BP 142/102 | HR 82 | Ht 68.0 in | Wt 259.0 lb

## 2016-08-23 DIAGNOSIS — I2510 Atherosclerotic heart disease of native coronary artery without angina pectoris: Secondary | ICD-10-CM | POA: Diagnosis not present

## 2016-08-23 DIAGNOSIS — I10 Essential (primary) hypertension: Secondary | ICD-10-CM | POA: Diagnosis not present

## 2016-08-23 DIAGNOSIS — F419 Anxiety disorder, unspecified: Secondary | ICD-10-CM

## 2016-08-23 DIAGNOSIS — Z9861 Coronary angioplasty status: Secondary | ICD-10-CM

## 2016-08-23 DIAGNOSIS — E782 Mixed hyperlipidemia: Secondary | ICD-10-CM | POA: Diagnosis not present

## 2016-08-23 MED ORDER — AMLODIPINE BESYLATE 2.5 MG PO TABS: 2.5000 mg | ORAL_TABLET | Freq: Every day | ORAL | 3 refills | Status: DC

## 2016-08-23 NOTE — Patient Instructions (Signed)
Medication Instructions:  START Norvasc 2.'5mg'$  Take 1 tab by mouth once a day.  Labwork: None   Testing/Procedures: None   Follow-Up: Your physician recommends that you schedule a follow-up appointment in: 3 MONTHS with Dr Debara Pickett.  Any Other Special Instructions Will Be Listed Below (If Applicable).  BLOOD PRESSURE CHECK  DATE:______________________________________  TIME:______________________________________AM/PM   If you need a refill on your cardiac medications before your next appointment, please call your pharmacy.  NEXT APPOINTMENT  DATE:______________________________________  TIME:______________________________________AM/PM

## 2016-08-23 NOTE — Progress Notes (Signed)
OFFICE NOTE  Chief Complaint:  Follow-up stress test  Primary Care Physician: Arnoldo Morale, MD  HPI:  Andrew West  is a 50 year old gentleman with a history of coronary artery disease and stent placement to the circumflex and obtuse marginal and a history of in stent restenosis to the LAD. There is also a nondominant right coronary artery that is 100% occluded, filled with collaterals. He did have a stress test recently, which showed an EF of 53% and reversible septal ischemia in the setting of chest pain and after much convincing underwent cardiac catheterization.  He then underwent cutting balloon angioplasty to an ostial lateral OM1 branch and the bifurcation AV groove circumflex OM junction. This was successful at reducing the stenosis to 0%. This was in November 2012 and he did not return for followup appointment until April 2013. He was suffering from low back pain and has been evaluated and treated by Dr. Trenton Gammon with a laminectomy/discectomy, which he says was not helpful. Otherwise, he continues to smoke and occasionally complains of shortness of breath and some chest pain symptoms which are atypical. He is on long-acting and/or has not needed to take short-acting nitroglycerin. His other concern today is that he is having problems with his teeth and was recommended to have edentulation and by an oral surgeon Dr. Diona Browner. Finally, he is suffering from significant stress and depression due to recent separation with wife in dealing with his kids. This seems to be a big tablets on his continued smoking.  Mr. Whittaker returns today in the office. He feels fairly well. He denies any chest pain. He continues to have problems with low back pain and numbness and tingling in his legs. He is apparently status post laminectomy and microdiscectomy by Dr. Trenton Gammon and continues to have symptoms which may be related to that. He's also complaining of what sounds like neuropathic pain. He  is not currently on medication. He is asking for Tylenol 3 for pain today which I told him I did not prescribe. Ultimately there are no further surgical or nonsurgical options, he may need to go on a neuropathic pain medication or perhaps be referred to a pain management specialist. He is also looking for a new primary care provider as his primary care provider is close to retirement. He reports he has cut back his smoking to about 1 pack every 2 weeks. He is also been out of amlodipine and simvastatin although his blood pressure is well controlled.  I saw Mr. Shippy in the office today. He is reporting now complains of shortness of breath and chest pain. He also feels like his breathing is worse when working around chemicals at work. He uses a respiratory protection mask but also feels like he is under significant stress. He's had missed several days of work and is concerned about his job. Sounds like he is in a difficult work environment. He says that he is apologizing for "deceiving me" that he was not honest about his symptoms. He apparently has been having shortness of breath and chest pain for several months and did not relay that to me.  Mr. Marchiano returns to the office today for follow-up. His main complaint is shortness of breath. He's not feeling as much chest pain he's had been previously. We recently did a nuclear stress test which was negative for ischemia and showed an EF of 56%. With regards to shortness of breath he's concerned most of this was related to chemicals at work although  he has not worked in several months. He's had about 20 pound weight gain since we last saw him in the office and denies any lower extremity swelling, orthopnea or PND. Shortness of breath is persistent and is associated with some wheezing. He did see Dr. 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 Shortness of breath seems to be a little bit worse with exertion which makes me wonder whether there is an element of exercise-induced pulmonary hypertension. He's not had an echocardiogram in some time.  Mr. Higinbotham returns today for follow-up. He was noted to have some diastolic dysfunction on his echocardiogram. I started him on low-dose Lasix and check lab work including a BNP which is very low around 50. On follow-up today he reports he feels no different with the addition of Lasix. I therefore asked him to take it as needed. He is also describing worsening substernal chest discomfort and a squeezing pressure in his chest today. He says this is been getting worse over the past several days, more than his typical chest discomfort. As previously noted he recently underwent a nuclear stress test a few months ago which was negative for ischemia.   Mr. Dewald returns for follow-up. As mentioned he had had recent progressive chest pain symptoms and worsening shortness of breath. He underwent another cardiac catheterization which demonstrated the following:   Ost 2nd Mrg to 2nd Mrg lesion, 99% stenosed. Post intervention, 99% residual stenosis remained. The lesion was previously treated with a bare metal stentgreater than two years ago.  Mid Cx-2 lesion, 90% stenosed. Post intervention, there is a 20% residual stenosis.  Mid Cx-1 lesion, 80% stenosed. Post intervention, there is a 50% residual stenosis. The lesion was previously treated with a bare metal stent.  Ramus lesion, 100% stenosed. The lesion was previously treated with a bare metal stentgreater than two years ago.  Prox RCA lesion, 100% stenosed.  There is mild left ventricular systolic dysfunction.  Moderately elevated LVEDP of 20 mmHg   Difficult situation with what amounts to be a totally occluded (In-stent re-stenosis) of the OM2 branch with sub-optimal PTCA of the mid AV-Groove In-stent restenosis. He  continues to have chest pain although reports it somewhat improved. He was started on ranolazine and currently is on 1000 mg twice a day.  Mr. Holzheimer returns today for follow-up. He reports he is doing fairly well on medical therapy. He still gets some sharp chest pain mostly along the right sternal border. He does get short of breath with moderate exertion. He's trying to do some walking on a treadmill but generally stops the exercise once he gets short of breath because he is very nervous. He is not currently working due to combined cardiac pulmonary and musculoskeletal diseases. He says that he and his wife are going to go on a delayed honeymoon since they never took one.  04/20/2016  Mr. Greenfield returns today for follow-up. He was recently seen in the hospital in the beginning of May for chest pain. He reports that it's up in the left neck base around the area of the left clavicle. It's very exquisitely tender to touch and feels sharp and electric. This pain is related we think to cervical pain. He ruled out for MI and was not worked up further for coronary ischemia. This pain is felt to be distinctly different. His isosorbide was  increased up to 60 mg twice a day but he notes no change in his symptoms with that. His primary care providers hesitant to provide any pain medication.  06/28/2016  Mr. Scroggs was seen back today in follow-up. He recently was in the ER at [facility] the left without being seen after 3 hours. An EKG was performed and showed no ischemic changes. He reported going home and taking some nitroglycerin and aspirin with some eventual improvement in his symptoms. He continues to have a sharp left chest discomfort which is tender to the touch and is persistent. It's not necessarily worse with exertion or relieved by rest. I felt this may likely be a neuropathic pain and I recommended starting gabapentin 300 mg daily at bedtime. He reports no improvement with this medication. I advised him to  discontinue today. He's also concerned about poor sleep at night, significant preoccupation with death and anxiety. This is concerning for possible PTSD type symptoms.  08/23/2016  Mr. Toto returns today for follow-up. I referred him to behavioral health and he says that he went but it was not helpful. The medical record however does not show any evidence that he made that appointment. He did present to the emergency department on 06/29/2016 with depression and anxiety and was felt not to need inpatient treatment. He underwent nuclear stress testing which was negative for ischemia and showed normal LV function. I do not believe is ongoing chest pain at this time is due to ischemia. Blood pressure is elevated today however 142/102. A recheck was 138/89.  PMHx:  Past Medical History:  Diagnosis Date  . CAD (coronary artery disease)    2002; treated with stent to mid L circumflex  . Chronic back pain   . Chronic leg pain    bilateral  . Depression   . Diabetes mellitus   . GERD (gastroesophageal reflux disease)    Takes Dexilant  . HLD (hyperlipidemia)   . Hypertension   . Rhabdomyolysis    h/o, r/t statins    Past Surgical History:  Procedure Laterality Date  . CARDIAC CATHETERIZATION N/A 09/25/2015   Procedure: Left Heart Cath and Coronary Angiography;  Surgeon: Leonie Man, MD;  Location: [facility];  Service: Cardiovascular;  Laterality: N/A;  . COLONOSCOPY W/ POLYPECTOMY    . CORONARY ANGIOPLASTY  09/25/2015   mid cir & om  . CORONARY ANGIOPLASTY WITH STENT PLACEMENT  10/09/2001   PTCA & stenting of mid AV circumflex; 2.5x83m Pixel stent  . CORONARY ANGIOPLASTY WITH STENT PLACEMENT  12/13/2001   PCI with stent to mid L circumflex, 95% stenosis to 0% residual  . CORONARY ANGIOPLASTY WITH STENT PLACEMENT  10/10/2003   PCI to mid AV circumflex; LAD 30% disease; RCA 100% occluded prox.  . CORONARY ANGIOPLASTY WITH STENT PLACEMENT  09/01/2011   PCI with stenting with  bare metal stent to mid AV groove circumflex and PDA  . CORONARY ANGIOPLASTY WITH STENT PLACEMENT  10/17/2011   cutting balloon angioplasty of ostial lateral OM1 branch and bifurcation AV groove circumflex OM junction; stenosis reduced to 0%  . LEFT HEART CATHETERIZATION WITH CORONARY ANGIOGRAM N/A 10/18/2011   Procedure: LEFT HEART CATHETERIZATION WITH CORONARY ANGIOGRAM;  Surgeon: DLeonie Man MD;  [facility];  Service: Cardiovascular;  Laterality: N/A;  . LUMBAR LAMINECTOMY/DECOMPRESSION MICRODISCECTOMY  03/31/2012   Procedure: LUMBAR LAMINECTOMY/DECOMPRESSION MICRODISCECTOMY 1 LEVEL;  Surgeon: HCharlie Pitter MD;  [facility];  Service: Neurosurgery;  Laterality: Left;  .  TRANSTHORACIC ECHOCARDIOGRAM  07/28/2011   EF 55-65%; LVH, grade 1 diastolic dysfunction;     FAMHx:  Family History  Problem Relation Age of Onset  . Heart attack Father   . Hypertension Mother   . Diabetes Mother   . Heart disease Brother     x 3   . Heart attack Brother     deceased  . Hypertension Sister   . Diabetes Sister   . Anesthesia problems Neg Hx   . Hypotension Neg Hx   . Malignant hyperthermia Neg Hx   . Pseudochol deficiency Neg Hx     SOCHx:   reports that he quit smoking about 15 months ago. His smoking use included Cigarettes. He has a 6.25 pack-year smoking history. He has never used smokeless tobacco. He reports that he does not drink alcohol or use drugs.  ALLERGIES:  Allergies  Allergen Reactions  . Iohexol     PT. TO BE PREMEDICATED PRIOR TO IV CONTRAST PER DR Kris Hartmann /MMS//12/15/15Desc: PT BECAME SOB AND CHEST TIGHTNESS AFTER CONTRAST INJECTION.  [name],RT-RCT., Onset Date: [id number]   . Vicodin [Hydrocodone-Acetaminophen] Itching and Rash    ROS: Pertinent items noted in HPI and remainder of comprehensive ROS otherwise negative.  HOME MEDS: Current Outpatient Prescriptions  Medication Sig Dispense Refill  . acetaminophen (TYLENOL) 500 MG tablet  Take 1,000 mg by mouth every 6 (six) hours as needed for headache.    . Albuterol Sulfate (PROAIR RESPICLICK) 595 (90 Base) MCG/ACT AEPB Inhale 1 puff into the lungs every 6 (six) hours as needed (shortness of breath). 1 each 2  . aspirin 81 MG tablet Take 1 tablet (81 mg total) by mouth daily.    . clopidogrel (PLAVIX) 75 MG tablet Take 1 tablet (75 mg total) by mouth daily. 90 tablet 3  . furosemide (LASIX) 20 MG tablet Take 1 tablet (20 mg total) by mouth daily as needed for fluid or edema. 30 tablet 2  . isosorbide mononitrate (IMDUR) 30 MG 24 hr tablet Take 2 tablets (60 mg total) by mouth 2 (two) times daily. 60 tablet 3  . meloxicam (MOBIC) 7.5 MG tablet Take 1 tablet (7.5 mg total) by mouth daily. 30 tablet 0  . metFORMIN (GLUCOPHAGE) 1000 MG tablet Take 1 tablet (1,000 mg total) by mouth 2 (two) times daily with a meal. 180 tablet 1  . metoprolol (LOPRESSOR) 100 MG tablet Take 1 tablet (100 mg total) by mouth 2 (two) times daily. 60 tablet 2  . mometasone (ASMANEX) 220 MCG/INH inhaler Inhale 2 puffs into the lungs daily. 1 Inhaler 2  . nitroGLYCERIN (NITROSTAT) 0.4 MG SL tablet Place 1 tablet (0.4 mg total) under the tongue every 5 (five) minutes as needed for chest pain. 25 tablet 11  . pantoprazole (PROTONIX) 40 MG tablet Take 1 tablet (40 mg total) by mouth daily. 30 tablet 11  . ranolazine (RANEXA) 1000 MG SR tablet Take 1 tablet (1,000 mg total) by mouth 2 (two) times daily. 180 tablet 3  . simvastatin (ZOCOR) 20 MG tablet Take 1 tablet (20 mg total) by mouth at bedtime. (Patient taking differently: Take 20 mg by mouth daily. ) 90 tablet 3  . traMADol (ULTRAM) 50 MG tablet Take 1 tablet (50 mg total) by mouth daily. Reported on 05/31/2016 60 tablet 1  . valsartan-hydrochlorothiazide (DIOVAN HCT) 320-25 MG tablet Take 1 tablet by mouth daily. 90 tablet 1  . amLODipine (NORVASC) 2.5 MG tablet Take 1 tablet (2.5 mg total) by mouth daily. [place]  tablet 3   No current facility-administered  medications for this visit.     LABS/IMAGING: No results found for this or any previous visit (from the past 48 hour(s)). No results found.  VITALS: BP (!) 142/102   Pulse 82   Ht '5\' 8"'$  (1.727 m)   Wt 259 lb (117.5 kg)   BMI 39.38 kg/m   EXAM: Deferred  EKG: Deferred  ASSESSMENT:  1. Chest pain and dyspnea on exertion - Occluded OM2 at a prior stent - no good revascularization options - low risk Myoview with no ischemia (07/2016) 2. Coronary artery disease status post PCI and cutting balloon angioplasty over 2 years ago 3. Ongoing tobacco abuse 4. Hypertension 5. Dyslipidemia 6. Neuropathy 7. Persistent low back pain 8. Diabetes type 2 9. Morbid obesity 10. Anxiety/?PTSD  PLAN: 1.   Mr. Zeoli is describing chest pain which sounds neuropathic in nature. He had another Myoview which was negative for ischemia. He's traveling with anxiety depression and possibly PTSD. I referred him to psychiatry but he never made that appointment and he did present to the emergency department asking for help but was not felt to need inpatient admission. At this point he is not interested in a psych evaluation. Follow-up with me in 3 months.   Pixie Casino, MD, [facility] Attending Cardiologist [place] C Jagdeep Ancheta 08/23/2016, 1:27 PM

## 2016-09-09 ENCOUNTER — Telehealth: Payer: Self-pay | Admitting: Internal Medicine

## 2016-09-09 NOTE — Telephone Encounter (Signed)
New message      Pt c/o Shortness Of Breath: STAT if SOB developed within the last 24 hours or pt is noticeably SOB on the phone  1. Are you currently SOB (can you hear that pt is SOB on the phone)?  no  2. How long have you been experiencing SOB? Started 1 hr ago; pt has COPD and took a breathing treatment 1 hr ago.  Pt states his right side is going numb 3. Are you SOB when sitting or when up moving around?  Sitting down 4. Are you currently experiencing any other symptoms?  Pt states his right side is going numb---chest was hurting but took nitro around 2pm

## 2016-09-09 NOTE — Telephone Encounter (Signed)
Pt experiencing SOB and chest pain which he notes is similar to what he's had before but "feels different somehow". Notes no other symptoms, denies cough, URI symptoms. Took a breathing treatment with which he reported relief. He took NTG which alleviated his chest pain "a little bit" but states still hurting. I advised ER evaluation to r/o cardiac or other issues. Pt voiced understanding and thanks. Declines ER visit for now, but states he will wait and if not improved w additional nitro he will have daughter take him to hospital.

## 2016-09-13 ENCOUNTER — Ambulatory Visit (INDEPENDENT_AMBULATORY_CARE_PROVIDER_SITE_OTHER): Payer: BC Managed Care – PPO | Admitting: Pharmacist

## 2016-09-13 ENCOUNTER — Encounter: Payer: Self-pay | Admitting: Pharmacist

## 2016-09-13 VITALS — BP 142/108 | HR 80

## 2016-09-13 DIAGNOSIS — I10 Essential (primary) hypertension: Secondary | ICD-10-CM

## 2016-09-13 MED ORDER — AMLODIPINE BESYLATE 5 MG PO TABS: 5.0000 mg | ORAL_TABLET | Freq: Every day | ORAL | 3 refills | Status: DC

## 2016-09-13 MED FILL — AMLODIPINE BESYLATE 5 MG TA: 5 | 30 days supply | Qty: 30 | Fill #0

## 2016-09-13 NOTE — Progress Notes (Signed)
Patient ID: BURT NAREZ                 DOB: 1966-11-03                      MRN: [id number]     HPI: Andrew West is a 50 y.o. male patient of Dr. Rennis Golden with PMH below who presents today for hypertension evaluation.  Pt was seen by Dr. Rennis Golden recently and has continuous chest pain to due occluded OM2 which has no good revascularization options. Dr. Rennis Golden also noted that his chest pain sounded neuropathic in nature and the pt had a Myoview that was negative for ischemia. At this visit, his BP was elevated so amlodipine 2.5 mg daily was started.   Pt called into clinic on 09/09/16 with SOB and chest pain. Pt reports using a breathing treatment and NG with some pain relief. He declined ER evaluation at this time.   In clinic today, pt reports that he is still having chest pain but has not taken any nitroglycerin since Thursday. He also reports having frequent headaches for which he takes APAP. He denies missing doses of medications and states that he takes all of his meds at 5:00 AM.   Cardiac Hx: HTN, CAD with multiple PCI, hx of NSTEMI, COPD with asthma, DM, obesity, HLD,   Current HTN meds:  Metoprolol 100mg  BID Valsartan/HCTZ 320/25mg  daily QAM Furosemide 20mg  daily PRN (takes about TIW) Isosorbide mononitrate 60mg  BID Amlodipine 2.5mg  daily QAM  Previously tried: none  BP goal: <140/90  Family History: Dad-HTN, DM, heart problems, passed of massive stroke at 32 YO. Mom-low BP problems, cancer and DM. Had 2 brothers and 2 sisters that all had HTN and have all passed of heart problems.   Social History: Pt reports that he was a former smoker but quit 3-4 years ago. Pt denies any smokeless tobacco or alcohol.  Diet: Pt states he eats most of his meals from home but denies salts/seasonings. He uses [employer] for cooking. He rarely drinks coffee or soda/tea.  Exercise: Pt use to go to the gym 2-3 times/week but has cut back to 1 time/week because of increasing chest pain. Usually  walks 2-3 minutes on the treadmill but has to stop due to SOB and also uses free weights.  Home BP readings: Pt does not have a home cuff  Wt Readings from Last 3 Encounters:  08/23/16 259 lb (117.5 kg)  07/07/16 261 lb (118.4 kg)  06/29/16 252 lb (114.3 kg)   BP Readings from Last 3 Encounters:  09/13/16 (!) 142/108  08/23/16 (!) 142/102  06/29/16 (!) 139/104   Pulse Readings from Last 3 Encounters:  09/13/16 80  08/23/16 82  06/29/16 66    Renal function: CrCl cannot be calculated (Unknown ideal weight.).  Past Medical History:  Diagnosis Date  . CAD (coronary artery disease)    2002; treated with stent to mid L circumflex  . Chronic back pain   . Chronic leg pain    bilateral  . Depression   . Diabetes mellitus   . GERD (gastroesophageal reflux disease)    Takes Dexilant  . HLD (hyperlipidemia)   . Hypertension   . Rhabdomyolysis    h/o, r/t statins    Current Outpatient Prescriptions on File Prior to Visit  Medication Sig Dispense Refill  . acetaminophen (TYLENOL) 500 MG tablet Take 1,000 mg by mouth every 6 (six) hours as needed for headache.    Marland Kitchen  Albuterol Sulfate (PROAIR RESPICLICK) 108 (90 Base) MCG/ACT AEPB Inhale 1 puff into the lungs every 6 (six) hours as needed (shortness of breath). 1 each 2  . aspirin 81 MG tablet Take 1 tablet (81 mg total) by mouth daily.    . clopidogrel (PLAVIX) 75 MG tablet Take 1 tablet (75 mg total) by mouth daily. 90 tablet 3  . furosemide (LASIX) 20 MG tablet Take 1 tablet (20 mg total) by mouth daily as needed for fluid or edema. 30 tablet 2  . isosorbide mononitrate (IMDUR) 30 MG 24 hr tablet Take 2 tablets (60 mg total) by mouth 2 (two) times daily. 60 tablet 3  . metFORMIN (GLUCOPHAGE) 1000 MG tablet Take 1 tablet (1,000 mg total) by mouth 2 (two) times daily with a meal. 180 tablet 1  . metoprolol (LOPRESSOR) 100 MG tablet Take 1 tablet (100 mg total) by mouth 2 (two) times daily. 60 tablet 2  . mometasone (ASMANEX) 220  MCG/INH inhaler Inhale 2 puffs into the lungs daily. 1 Inhaler 2  . nitroGLYCERIN (NITROSTAT) 0.4 MG SL tablet Place 1 tablet (0.4 mg total) under the tongue every 5 (five) minutes as needed for chest pain. 25 tablet 11  . pantoprazole (PROTONIX) 40 MG tablet Take 1 tablet (40 mg total) by mouth daily. 30 tablet 11  . ranolazine (RANEXA) 1000 MG SR tablet Take 1 tablet (1,000 mg total) by mouth 2 (two) times daily. 180 tablet 3  . simvastatin (ZOCOR) 20 MG tablet Take 1 tablet (20 mg total) by mouth at bedtime. (Patient taking differently: Take 20 mg by mouth daily. ) 90 tablet 3  . valsartan-hydrochlorothiazide (DIOVAN HCT) 320-25 MG tablet Take 1 tablet by mouth daily. 90 tablet 1  . meloxicam (MOBIC) 7.5 MG tablet Take 1 tablet (7.5 mg total) by mouth daily. (Patient not taking: Reported on 09/13/2016) 30 tablet 0  . traMADol (ULTRAM) 50 MG tablet Take 1 tablet (50 mg total) by mouth daily. Reported on 05/31/2016 (Patient not taking: Reported on 09/13/2016) 60 tablet 1   No current facility-administered medications on file prior to visit.     Allergies  Allergen Reactions  . Iohexol     PT. TO BE PREMEDICATED PRIOR TO IV CONTRAST PER DR Eppie Gibson /MMS//12/15/15Desc: PT BECAME SOB AND CHEST TIGHTNESS AFTER CONTRAST INJECTION.  [name],RT-RCT., Onset Date: [id number]   . Vicodin [Hydrocodone-Acetaminophen] Itching and Rash    Blood pressure (!) 142/108, pulse 80, SpO2 97 %.   Assessment/Plan: Hypertension: BP is not at goal and remains uncontrolled. Will increase amlodipine to 5 mg daily and instructed patient to move this medication to the evening. Will follow up with patient in 4 weeks for further titration of medications.   Thank you, Freddie Apley. Cleatis Polka, PharmD  [facility]  09/13/2016 10:29 AM

## 2016-09-13 NOTE — Patient Instructions (Addendum)
Return for a a follow up appointment in 3-4 weeks.   Check your blood pressure at home daily (if able) and keep record of the readings.  Take your BP meds as follows: INCREASE amlodipine to '5mg'$  daily take in the evening  Bring all of your meds, your BP cuff and your record of home blood pressures to your next appointment.  Exercise as you're able, try to walk approximately 30 minutes per day.  Keep salt intake to a minimum, especially watch canned and prepared boxed foods.  Eat more fresh fruits and vegetables and fewer canned items.  Avoid eating in fast food restaurants.    HOW TO TAKE YOUR BLOOD PRESSURE: . Rest 5 minutes before taking your blood pressure. .  Don't smoke or drink caffeinated beverages for at least 30 minutes before. . Take your blood pressure before (not after) you eat. . Sit comfortably with your back supported and both feet on the floor (don't cross your legs). . Elevate your arm to heart level on a table or a desk. . Use the proper sized cuff. It should fit smoothly and snugly around your bare upper arm. There should be enough room to slip a fingertip under the cuff. The bottom edge of the cuff should be 1 inch above the crease of the elbow. . Ideally, take 3 measurements at one sitting and record the average.

## 2016-09-15 ENCOUNTER — Emergency Department (HOSPITAL_COMMUNITY)
Admission: EM | Admit: 2016-09-15 | Discharge: 2016-09-15 | Disposition: A | Discharge status: Home / Self Care | Payer: BC Managed Care – PPO | Attending: Dermatology | Admitting: Dermatology

## 2016-09-15 ENCOUNTER — Telehealth: Payer: Self-pay | Admitting: Internal Medicine

## 2016-09-15 ENCOUNTER — Encounter (HOSPITAL_COMMUNITY): Payer: Self-pay

## 2016-09-15 DIAGNOSIS — R079 Chest pain, unspecified: Secondary | ICD-10-CM | POA: Insufficient documentation

## 2016-09-15 DIAGNOSIS — R51 Headache: Secondary | ICD-10-CM | POA: Insufficient documentation

## 2016-09-15 DIAGNOSIS — Z7984 Long term (current) use of oral hypoglycemic drugs: Secondary | ICD-10-CM | POA: Diagnosis not present

## 2016-09-15 DIAGNOSIS — Z955 Presence of coronary angioplasty implant and graft: Secondary | ICD-10-CM | POA: Diagnosis not present

## 2016-09-15 DIAGNOSIS — I2510 Atherosclerotic heart disease of native coronary artery without angina pectoris: Secondary | ICD-10-CM | POA: Diagnosis not present

## 2016-09-15 DIAGNOSIS — Z5321 Procedure and treatment not carried out due to patient leaving prior to being seen by health care provider: Secondary | ICD-10-CM | POA: Insufficient documentation

## 2016-09-15 DIAGNOSIS — Z7982 Long term (current) use of aspirin: Secondary | ICD-10-CM | POA: Insufficient documentation

## 2016-09-15 DIAGNOSIS — E119 Type 2 diabetes mellitus without complications: Secondary | ICD-10-CM | POA: Insufficient documentation

## 2016-09-15 LAB — CBC
HEMATOCRIT: 43.6 % (ref 39.0–52.0)
HEMOGLOBIN: 14.7 g/dL (ref 13.0–17.0)
MCH: 26.8 pg (ref 26.0–34.0)
MCHC: 33.7 g/dL (ref 30.0–36.0)
MCV: 79.4 fL (ref 78.0–100.0)
Platelets: 249 10*3/uL (ref 150–400)
RBC: 5.49 MIL/uL (ref 4.22–5.81)
RDW: 13.4 % (ref 11.5–15.5)
WBC: 7.2 10*3/uL (ref 4.0–10.5)

## 2016-09-15 LAB — BASIC METABOLIC PANEL
ANION GAP: 10 (ref 5–15)
BUN: 7 mg/dL (ref 6–20)
CHLORIDE: 106 mmol/L (ref 101–111)
CO2: 25 mmol/L (ref 22–32)
Calcium: 8.8 mg/dL — ABNORMAL LOW (ref 8.9–10.3)
Creatinine, Ser: 0.71 mg/dL (ref 0.61–1.24)
Glucose, Bld: 90 mg/dL (ref 65–99)
POTASSIUM: 3.5 mmol/L (ref 3.5–5.1)
SODIUM: 141 mmol/L (ref 135–145)

## 2016-09-15 LAB — I-STAT TROPONIN, ED: Troponin i, poc: 0 ng/mL (ref 0.00–0.08)

## 2016-09-15 MED FILL — RANEXA ER 1,000 MG TABLET: 1000 | 30 days supply | Qty: 60 | Fill #1

## 2016-09-15 NOTE — ED Triage Notes (Signed)
Pt reports central chest pain that started yesterday. Pt also reports headache X2 days. No acute distress noted.

## 2016-09-15 NOTE — Telephone Encounter (Signed)
Returned call to patient.He stated he continues to have elevated B/P.Stated he went to dentist to have teeth pulled this morning, unable to pull teeth B/P 166/107.Stated he is presently having chest pain rates pain # 7.Advised he needs to go to [facility] ER.Trish called.

## 2016-09-15 NOTE — ED Notes (Signed)
Pt asked how much longer I told the pt how many peoiple was lefft in front of him and he said he was leaving.

## 2016-09-15 NOTE — Telephone Encounter (Signed)
New message  Pt call requesting to speak with RN. Pt states medication that was prescribed to him is not working. Pt does not know the name of the medication he is speaking of. Please call back to discuss

## 2016-09-20 ENCOUNTER — Encounter: Payer: Self-pay | Admitting: Family Medicine

## 2016-09-20 ENCOUNTER — Ambulatory Visit: Payer: BC Managed Care – PPO | Attending: Family Medicine | Admitting: Family Medicine

## 2016-09-20 VITALS — BP 154/93 | HR 85 | Temp 98.6°F | Ht 68.5 in | Wt 264.6 lb

## 2016-09-20 DIAGNOSIS — F331 Major depressive disorder, recurrent, moderate: Secondary | ICD-10-CM | POA: Insufficient documentation

## 2016-09-20 DIAGNOSIS — G4709 Other insomnia: Secondary | ICD-10-CM

## 2016-09-20 DIAGNOSIS — Z79899 Other long term (current) drug therapy: Secondary | ICD-10-CM | POA: Diagnosis not present

## 2016-09-20 DIAGNOSIS — J449 Chronic obstructive pulmonary disease, unspecified: Secondary | ICD-10-CM | POA: Diagnosis not present

## 2016-09-20 DIAGNOSIS — E119 Type 2 diabetes mellitus without complications: Secondary | ICD-10-CM | POA: Insufficient documentation

## 2016-09-20 DIAGNOSIS — E0800 Diabetes mellitus due to underlying condition with hyperosmolarity without nonketotic hyperglycemic-hyperosmolar coma (NKHHC): Secondary | ICD-10-CM | POA: Diagnosis not present

## 2016-09-20 DIAGNOSIS — Z9889 Other specified postprocedural states: Secondary | ICD-10-CM | POA: Insufficient documentation

## 2016-09-20 DIAGNOSIS — G4700 Insomnia, unspecified: Secondary | ICD-10-CM | POA: Diagnosis not present

## 2016-09-20 DIAGNOSIS — I10 Essential (primary) hypertension: Secondary | ICD-10-CM | POA: Diagnosis not present

## 2016-09-20 DIAGNOSIS — M5116 Intervertebral disc disorders with radiculopathy, lumbar region: Secondary | ICD-10-CM | POA: Diagnosis not present

## 2016-09-20 DIAGNOSIS — E782 Mixed hyperlipidemia: Secondary | ICD-10-CM | POA: Insufficient documentation

## 2016-09-20 DIAGNOSIS — Z7982 Long term (current) use of aspirin: Secondary | ICD-10-CM | POA: Insufficient documentation

## 2016-09-20 DIAGNOSIS — Z955 Presence of coronary angioplasty implant and graft: Secondary | ICD-10-CM

## 2016-09-20 LAB — GLUCOSE, POCT (MANUAL RESULT ENTRY): POC Glucose: 205 mg/dl — AB (ref 70–99)

## 2016-09-20 LAB — POCT GLYCOSYLATED HEMOGLOBIN (HGB A1C): HEMOGLOBIN A1C: 6

## 2016-09-20 MED ORDER — ATORVASTATIN CALCIUM 20 MG PO TABS: 20.0000 mg | ORAL_TABLET | Freq: Every day | ORAL | 3 refills | Status: DC

## 2016-09-20 MED ORDER — METOPROLOL TARTRATE 100 MG PO TABS: 100.0000 mg | ORAL_TABLET | Freq: Two times a day (BID) | ORAL | 2 refills | Status: DC

## 2016-09-20 MED ORDER — TRAZODONE HCL 50 MG PO TABS: 50.0000 mg | ORAL_TABLET | Freq: Every evening | ORAL | 3 refills | Status: DC | PRN

## 2016-09-20 MED ORDER — METFORMIN HCL 1000 MG PO TABS: 1000.0000 mg | ORAL_TABLET | Freq: Two times a day (BID) | ORAL | 1 refills | Status: DC

## 2016-09-20 MED ORDER — MOMETASONE FUROATE 220 MCG/INH IN AEPB: 2.0000 | INHALATION_SPRAY | Freq: Every day | RESPIRATORY_TRACT | 2 refills | Status: DC

## 2016-09-20 MED ORDER — TRAMADOL HCL 50 MG PO TABS: 50.0000 mg | ORAL_TABLET | Freq: Every day | ORAL | 2 refills | Status: DC

## 2016-09-20 MED ORDER — VALSARTAN-HYDROCHLOROTHIAZIDE 320-25 MG PO TABS: 1.0000 | ORAL_TABLET | Freq: Every day | ORAL | 1 refills | Status: DC

## 2016-09-20 MED ORDER — AMLODIPINE BESYLATE 10 MG PO TABS: 10.0000 mg | ORAL_TABLET | Freq: Every day | ORAL | 3 refills | Status: DC

## 2016-09-20 MED ORDER — FLUOXETINE HCL 20 MG PO TABS: 20.0000 mg | ORAL_TABLET | Freq: Every day | ORAL | 3 refills | Status: DC

## 2016-09-20 MED ORDER — ALBUTEROL SULFATE 108 (90 BASE) MCG/ACT IN AEPB: 1.0000 | INHALATION_SPRAY | Freq: Four times a day (QID) | RESPIRATORY_TRACT | 2 refills | Status: DC | PRN

## 2016-09-20 MED FILL — traZODone HCL 50 MG TABS: 50 | 30 days supply | Qty: 30 | Fill #0

## 2016-09-20 MED FILL — VALSARTAN-HCTZ 320-25 MG TA: 320-25 | 30 days supply | Qty: 30 | Fill #0

## 2016-09-20 MED FILL — AMLODIPINE BESYLATE 10 MG T: 10 | 30 days supply | Qty: 30 | Fill #0

## 2016-09-20 MED FILL — FLUoxetine HCL 20 MG CAPS: 20 | 30 days supply | Qty: 30 | Fill #0

## 2016-09-20 NOTE — Progress Notes (Signed)
Subjective:  Patient ID: Andrew West, male    DOB: 1966-09-01  Age: 50 y.o. MRN: [id number]  CC: Hypertension and Diabetes   HPI Andrew West is a 50 y.o. with a  Medical history of Type 2 DM (A1c 6.0),CAD s/p PCI/stent to LCx and OM1In 09/2015; cutting balloon angioplasty to OM 1, HLD, HTN, continuous tobacco abuse, chronic back pain s/p laminectomy/discectomy (3 years ago by Dr Dutch Quint), depression who comes into the clinic for follow-up visit.  Today he informs me he is willing to be commenced on antidepressants; has been seeing his pastor's wife for psychotherapy and refused referral to behavioral health in the past.He endorses several stressors including his health and other personal situation including severe back pain which are triggers for his depression but denies suicidal ideation or intent at this time.  His back continues to hurt and he was referred to orthopedic in 05/2016 and was scheduled an appointment however the patient claims he was never aware of it even though the chart reflects patient was made aware. He would also like to be referred to pain management.  Back pain radiates to right leg which sometimes feels weak and gives out on him resulting in falls. He has associated numbness in his lower extremities but denies loss of sphincteric function. Currently on gabapentin which does not help symptoms and tried Lyrica in the past which did not help .  His recently scheduled dental procedure was rescheduled due to elevated blood pressure despite compliance with antihypertensives. He has presented to the ED with chest pains and last visit to cardiology was on 08/23/16 with Dr. Rennis Golden and chest pains were thought to be neuropathic in nature.  Past Medical History:  Diagnosis Date  . CAD (coronary artery disease)    2002; treated with stent to mid L circumflex  . Chronic back pain   . Chronic leg pain    bilateral  . Depression   . Diabetes mellitus   . GERD  (gastroesophageal reflux disease)    Takes Dexilant  . HLD (hyperlipidemia)   . Hypertension   . Rhabdomyolysis    h/o, r/t statins    Past Surgical History:  Procedure Laterality Date  . CARDIAC CATHETERIZATION N/A 09/25/2015   Procedure: Left Heart Cath and Coronary Angiography;  Surgeon: Marykay Lex, MD;  Location: [facility] INVASIVE CV LAB;  Service: Cardiovascular;  Laterality: N/A;  . COLONOSCOPY W/ POLYPECTOMY    . CORONARY ANGIOPLASTY  09/25/2015   mid cir & om  . CORONARY ANGIOPLASTY WITH STENT PLACEMENT  10/09/2001   PTCA & stenting of mid AV circumflex; 2.5x40mm Pixel stent  . CORONARY ANGIOPLASTY WITH STENT PLACEMENT  12/13/2001   PCI with stent to mid L circumflex, 95% stenosis to 0% residual  . CORONARY ANGIOPLASTY WITH STENT PLACEMENT  10/10/2003   PCI to mid AV circumflex; LAD 30% disease; RCA 100% occluded prox.  . CORONARY ANGIOPLASTY WITH STENT PLACEMENT  09/01/2011   PCI with stenting with bare metal stent to mid AV groove circumflex and PDA  . CORONARY ANGIOPLASTY WITH STENT PLACEMENT  10/17/2011   cutting balloon angioplasty of ostial lateral OM1 branch and bifurcation AV groove circumflex OM junction; stenosis reduced to 0%  . LEFT HEART CATHETERIZATION WITH CORONARY ANGIOGRAM N/A 10/18/2011   Procedure: LEFT HEART CATHETERIZATION WITH CORONARY ANGIOGRAM;  Surgeon: Marykay Lex, MD;  Location: [facility] CATH LAB;  Service: Cardiovascular;  Laterality: N/A;  . LUMBAR LAMINECTOMY/DECOMPRESSION MICRODISCECTOMY  03/31/2012   Procedure: LUMBAR LAMINECTOMY/DECOMPRESSION  MICRODISCECTOMY 1 LEVEL;  Surgeon: Temple Pacini, MD;  Location: [facility] NEURO ORS;  Service: Neurosurgery;  Laterality: Left;  . TRANSTHORACIC ECHOCARDIOGRAM  07/28/2011   EF 55-65%; LVH, grade 1 diastolic dysfunction;     Allergies  Allergen Reactions  . Iohexol     PT. TO BE PREMEDICATED PRIOR TO IV CONTRAST PER DR Eppie Gibson /MMS//12/15/15Desc: PT BECAME SOB AND CHEST TIGHTNESS AFTER CONTRAST INJECTION.  STEPHANIE  DAVIS,RT-RCT., Onset Date: [id number]   . Vicodin [Hydrocodone-Acetaminophen] Itching and Rash     Outpatient Medications Prior to Visit  Medication Sig Dispense Refill  . acetaminophen (TYLENOL) 500 MG tablet Take 1,000 mg by mouth every 6 (six) hours as needed for headache.    Marland Kitchen aspirin 81 MG tablet Take 1 tablet (81 mg total) by mouth daily.    . clopidogrel (PLAVIX) 75 MG tablet Take 1 tablet (75 mg total) by mouth daily. 90 tablet 3  . furosemide (LASIX) 20 MG tablet Take 1 tablet (20 mg total) by mouth daily as needed for fluid or edema. 30 tablet 2  . isosorbide mononitrate (IMDUR) 30 MG 24 hr tablet Take 2 tablets (60 mg total) by mouth 2 (two) times daily. 60 tablet 3  . meloxicam (MOBIC) 7.5 MG tablet Take 1 tablet (7.5 mg total) by mouth daily. 30 tablet 0  . nitroGLYCERIN (NITROSTAT) 0.4 MG SL tablet Place 1 tablet (0.4 mg total) under the tongue every 5 (five) minutes as needed for chest pain. 25 tablet 11  . pantoprazole (PROTONIX) 40 MG tablet Take 1 tablet (40 mg total) by mouth daily. 30 tablet 11  . Albuterol Sulfate (PROAIR RESPICLICK) 108 (90 Base) MCG/ACT AEPB Inhale 1 puff into the lungs every 6 (six) hours as needed (shortness of breath). 1 each 2  . amLODipine (NORVASC) 5 MG tablet Take 1 tablet (5 mg total) by mouth daily. 30 tablet 3  . metFORMIN (GLUCOPHAGE) 1000 MG tablet Take 1 tablet (1,000 mg total) by mouth 2 (two) times daily with a meal. 180 tablet 1  . metoprolol (LOPRESSOR) 100 MG tablet Take 1 tablet (100 mg total) by mouth 2 (two) times daily. 60 tablet 2  . mometasone (ASMANEX) 220 MCG/INH inhaler Inhale 2 puffs into the lungs daily. 1 Inhaler 2  . simvastatin (ZOCOR) 20 MG tablet Take 1 tablet (20 mg total) by mouth at bedtime. (Patient taking differently: Take 20 mg by mouth daily. ) 90 tablet 3  . valsartan-hydrochlorothiazide (DIOVAN HCT) 320-25 MG tablet Take 1 tablet by mouth daily. 90 tablet 1  . ranolazine (RANEXA) 1000 MG SR tablet Take 1 tablet  (1,000 mg total) by mouth 2 (two) times daily. (Patient not taking: Reported on 09/20/2016) 180 tablet 3  . traMADol (ULTRAM) 50 MG tablet Take 1 tablet (50 mg total) by mouth daily. Reported on 05/31/2016 (Patient not taking: Reported on 09/20/2016) 60 tablet 1   No facility-administered medications prior to visit.     ROS Review of Systems Constitutional: Negative for activity change and appetite change.  HENT: Negative for sinus pressure and sore throat.   Eyes: Negative for visual disturbance.  Respiratory: Negative for cough, chest tightness and shortness of breath.   Cardiovascular: Negative for chest pain and leg swelling.  Gastrointestinal: Negative for abdominal pain, diarrhea, constipation and abdominal distention.  Endocrine: Negative.   Genitourinary: Negative for dysuria.  Musculoskeletal: Positive for back pain. Negative for myalgias and joint swelling.  Skin: Negative for rash.  Allergic/Immunologic: Negative.   Neurological: Positive for weakness and  numbness. Negative for light-headedness.  Psychiatric/Behavioral: Positive for suicidal ideation and intent Objective:  BP (!) 154/93 (BP Location: Right Arm, Patient Position: Sitting, Cuff Size: Large)   Pulse 85   Temp 98.6 F (37 C) (Oral)   Ht 5' 8.5" (1.74 m)   Wt 264 lb 9.6 oz (120 kg)   SpO2 98%   BMI 39.65 kg/m   BP/Weight 09/20/2016 09/15/2016 09/13/2016  Systolic BP 154 175 142  Diastolic BP 93 105 108  Wt. (Lbs) 264.6 241 -  BMI 39.65 35.59 -      Physical Exam Constitutional: He is oriented to person, place, and time. He appears well-developed and well-nourished.  Cardiovascular: Normal rate, normal heart sounds and intact distal pulses.   No murmur heard. Pulmonary/Chest: Effort normal and breath sounds normal. He has no wheezes. He has no rales. He exhibits no tenderness.  Abdominal: Soft. Bowel sounds are normal. He exhibits no distension and no mass. There is no tenderness.  Musculoskeletal:  He exhibits tenderness (tenderness on palpation of the lumbar spine; positive straight leg raise bilaterally   ).  Reduced range of motion of lumbar spine.  Neurological: He is alert and oriented to person, place, and time.  Psych: depressed   Lab Results  Component Value Date   HGBA1C 6.0 09/20/2016    Assessment & Plan:   1. Diabetes mellitus due to underlying condition with hyperosmolarity without coma, without long-term current use of insulin (HCC) Controlled with A1c of 6.0 Continue metformin, ADA diet - Glucose (CBG) - HgB A1c - metFORMIN (GLUCOPHAGE) 1000 MG tablet; Take 1 tablet (1,000 mg total) by mouth 2 (two) times daily with a meal.  Dispense: 180 tablet; Refill: 1 - atorvastatin (LIPITOR) 20 MG tablet; Take 1 tablet (20 mg total) by mouth daily.  Dispense: 90 tablet; Refill: 3 - Lipid panel; Future  2. Essential hypertension Uncontrolled, increase amlodipine from 5 mg to 10 mg. Low-sodium diet - amLODipine (NORVASC) 10 MG tablet; Take 1 tablet (10 mg total) by mouth daily.  Dispense: 30 tablet; Refill: 3 - metoprolol (LOPRESSOR) 100 MG tablet; Take 1 tablet (100 mg total) by mouth 2 (two) times daily.  Dispense: 60 tablet; Refill: 2 - valsartan-hydrochlorothiazide (DIOVAN HCT) 320-25 MG tablet; Take 1 tablet by mouth daily.  Dispense: 90 tablet; Refill: 1  3. Lumbar disc herniation with radiculopathy Uncontrolled on current regimen Received gabapentin from his cardiologist which he states is not helping and he is refusing initiation of Lyrica which he felt did not help in the past I have provided the number to the orthopedic office he was referred to in 05/2016 and he knows to call to reschedule this missed appointment - traMADol (ULTRAM) 50 MG tablet; Take 1 tablet (50 mg total) by mouth daily. Reported on 05/31/2016  Dispense: 60 tablet; Refill: 2 - Ambulatory referral to Pain Clinic  4. Moderate episode of recurrent major depressive disorder (HCC) He is now willing  to commence an antidepressant; no suicidal ideation or intent Refuses referral to therapy as he receives this from his Pastor's wife. - FLUoxetine (PROZAC) 20 MG tablet; Take 1 tablet (20 mg total) by mouth daily.  Dispense: 30 tablet; Refill: 3  5. Other insomnia Neck the due to underlying stresses - traZODone (DESYREL) 50 MG tablet; Take 1 tablet (50 mg total) by mouth at bedtime as needed for sleep.  Dispense: 30 tablet; Refill: 3  6. Mixed hyperlipidemia Stable Continue statin  7. Presence of stent in left circumflex coronary artery He  has had chest pains in the recent past which were thought to be noncardiac as per cardiology Continue Ranexa, Plavix Risk factor modification  8. COPD with asthma (HCC) Stable No acute flare - Albuterol Sulfate (PROAIR RESPICLICK) 108 (90 Base) MCG/ACT AEPB; Inhale 1 puff into the lungs every 6 (six) hours as needed (shortness of breath).  Dispense: 1 each; Refill: 2 - mometasone (ASMANEX) 220 MCG/INH inhaler; Inhale 2 puffs into the lungs daily.  Dispense: 1 Inhaler; Refill: 2   Meds ordered this encounter  Medications  . amLODipine (NORVASC) 10 MG tablet    Sig: Take 1 tablet (10 mg total) by mouth daily.    Dispense:  30 tablet    Refill:  3    Discontinue previous dose  . Albuterol Sulfate (PROAIR RESPICLICK) 108 (90 Base) MCG/ACT AEPB    Sig: Inhale 1 puff into the lungs every 6 (six) hours as needed (shortness of breath).    Dispense:  1 each    Refill:  2  . metFORMIN (GLUCOPHAGE) 1000 MG tablet    Sig: Take 1 tablet (1,000 mg total) by mouth 2 (two) times daily with a meal.    Dispense:  180 tablet    Refill:  1  . metoprolol (LOPRESSOR) 100 MG tablet    Sig: Take 1 tablet (100 mg total) by mouth 2 (two) times daily.    Dispense:  60 tablet    Refill:  2    Discontinue previous dosing  . mometasone (ASMANEX) 220 MCG/INH inhaler    Sig: Inhale 2 puffs into the lungs daily.    Dispense:  1 Inhaler    Refill:  2  . atorvastatin  (LIPITOR) 20 MG tablet    Sig: Take 1 tablet (20 mg total) by mouth daily.    Dispense:  90 tablet    Refill:  3  . FLUoxetine (PROZAC) 20 MG tablet    Sig: Take 1 tablet (20 mg total) by mouth daily.    Dispense:  30 tablet    Refill:  3  . traZODone (DESYREL) 50 MG tablet    Sig: Take 1 tablet (50 mg total) by mouth at bedtime as needed for sleep.    Dispense:  30 tablet    Refill:  3  . valsartan-hydrochlorothiazide (DIOVAN HCT) 320-25 MG tablet    Sig: Take 1 tablet by mouth daily.    Dispense:  90 tablet    Refill:  1  . traMADol (ULTRAM) 50 MG tablet    Sig: Take 1 tablet (50 mg total) by mouth daily. Reported on 05/31/2016    Dispense:  60 tablet    Refill:  2    Follow-up: Return in about 3 months (around 12/21/2016) for follow up on Diabetes.   Jaclyn Shaggy MD

## 2016-09-20 NOTE — Progress Notes (Signed)
BP has been extremely high Feeling really down and hopeless with the back issues

## 2016-09-21 ENCOUNTER — Ambulatory Visit: Payer: BC Managed Care – PPO | Attending: Family Medicine

## 2016-09-21 DIAGNOSIS — E785 Hyperlipidemia, unspecified: Secondary | ICD-10-CM

## 2016-09-21 DIAGNOSIS — E119 Type 2 diabetes mellitus without complications: Secondary | ICD-10-CM | POA: Diagnosis present

## 2016-09-21 DIAGNOSIS — E782 Mixed hyperlipidemia: Secondary | ICD-10-CM

## 2016-09-21 DIAGNOSIS — E0800 Diabetes mellitus due to underlying condition with hyperosmolarity without nonketotic hyperglycemic-hyperosmolar coma (NKHHC): Secondary | ICD-10-CM

## 2016-09-21 LAB — COMPLETE METABOLIC PANEL WITH GFR
ALT: 18 U/L (ref 9–46)
AST: 15 U/L (ref 10–35)
Albumin: 4 g/dL (ref 3.6–5.1)
Alkaline Phosphatase: 55 U/L (ref 40–115)
BUN: 10 mg/dL (ref 7–25)
CALCIUM: 9.2 mg/dL (ref 8.6–10.3)
CHLORIDE: 101 mmol/L (ref 98–110)
CO2: 27 mmol/L (ref 20–31)
CREATININE: 0.98 mg/dL (ref 0.70–1.33)
GFR, Est Non African American: >89 mL/min (ref >=60)
Glucose, Bld: 91 mg/dL (ref 65–99)
POTASSIUM: 4 mmol/L (ref 3.5–5.3)
Sodium: 139 mmol/L (ref 135–146)
Total Bilirubin: 0.5 mg/dL (ref 0.2–1.2)
Total Protein: 7.1 g/dL (ref 6.1–8.1)

## 2016-09-21 LAB — LIPID PANEL
CHOL/HDL RATIO: 5.2 ratio — AB (ref <=5.0)
CHOLESTEROL: 129 mg/dL (ref 125–200)
HDL: 25 mg/dL — ABNORMAL LOW (ref >=40)
LDL Cholesterol: 73 mg/dL (ref <130)
Triglycerides: 155 mg/dL — ABNORMAL HIGH (ref <150)
VLDL: 31 mg/dL — ABNORMAL HIGH (ref <30)

## 2016-09-21 NOTE — Progress Notes (Signed)
Patient here for lab visit only 

## 2016-09-22 ENCOUNTER — Telehealth: Payer: Self-pay

## 2016-09-22 LAB — MICROALBUMIN, URINE: MICROALB UR: 0.8 mg/dL

## 2016-09-22 NOTE — Telephone Encounter (Signed)
Writer called patietn per Dr. Jarold Song with lab results.  Patient stated an understanding.

## 2016-09-22 NOTE — Telephone Encounter (Signed)
-----   Message from Arnoldo Morale, MD sent at 09/22/2016  1:21 PM EDT ----- Kidney and liver functions are normal, total cholesterol is also normal but his good cholesterol (HDL) is not is low. Increasing physical activity and exercise can help raise this.

## 2016-10-04 NOTE — Progress Notes (Signed)
Patient ID: Andrew West                 DOB: 12/01/1965                      MRN: [id number]     HPI: Andrew West is a 50 y.o. male patient of Dr. Rennis Golden with PMH below who presents today for hypertension evaluation.  Pt was seen by Dr. Rennis Golden recently and has continuous chest pain to due occluded OM2 which has no good revascularization options. Dr. Rennis Golden also noted that his chest pain sounded neuropathic in nature and the pt had a Myoview that was negative for ischemia. At this visit, his BP was elevated so amlodipine 2.5 mg daily was started.   Pt called into clinic on 09/09/16 with SOB and chest pain. Pt reports using a breathing treatment and NG with some pain relief. He declined ER evaluation at this time.   In clinic today, pt reports that he is still having chest pain but has not taken any nitroglycerin since Thursday. He also reports having frequent headaches for which he takes APAP. He denies missing doses of medications and states that he takes all of his meds at 5:00 AM.   Cardiac Hx: HTN, CAD with multiple PCI, hx of NSTEMI, COPD with asthma, DM, obesity, HLD,   Current HTN meds:  Metoprolol 100 mg BID Valsartan/HCTZ 320/25 mg daily QAM Furosemide 20 mg daily PRN (takes about TIW) Isosorbide mononitrate 60 mg BID Amlodipine 5 mg daily QPM  Previously tried: none  BP goal: <140/90  Family History: Dad-HTN, DM, heart problems, passed of massive stroke at 31 YO. Mom-low BP problems, cancer and DM. Had 2 brothers and 2 sisters that all had HTN and have all passed of heart problems.   Social History: Pt reports that he was a former smoker but quit 3-4 years ago. Pt denies any smokeless tobacco or alcohol.  Diet: Pt states he eats most of his meals from home but denies salts/seasonings. He uses Mrs[employer] Dash for cooking. He rarely drinks coffee or soda/tea.  Exercise: Pt use to go to the gym 2-3 times/week but has cut back to 1 time/week because of increasing chest pain.  Usually walks 2-3 minutes on the treadmill but has to stop due to SOB and also uses free weights.  Home BP readings: Pt does not have a home cuff  Wt Readings from Last 3 Encounters:  09/20/16 264 lb 9.6 oz (120 kg)  09/15/16 241 lb (109.3 kg)  08/23/16 259 lb (117.5 kg)   BP Readings from Last 3 Encounters:  09/20/16 (!) 154/93  09/15/16 (!) 175/105  09/13/16 (!) 142/108   Pulse Readings from Last 3 Encounters:  09/20/16 85  09/15/16 81  09/13/16 80    Renal function: Estimated Creatinine Clearance (by C-G formula based on SCr of 0.98 mg/dL) Male: 57.8 mL/min Male: 114.5 mL/min  Past Medical History:  Diagnosis Date  . CAD (coronary artery disease)    2002; treated with stent to mid L circumflex  . Chronic back pain   . Chronic leg pain    bilateral  . Depression   . Diabetes mellitus   . GERD (gastroesophageal reflux disease)    Takes Dexilant  . HLD (hyperlipidemia)   . Hypertension   . Rhabdomyolysis    h/o, r/t statins    Current Outpatient Prescriptions on File Prior to Visit  Medication Sig Dispense Refill  . acetaminophen (TYLENOL)  500 MG tablet Take 1,000 mg by mouth every 6 (six) hours as needed for headache.    . Albuterol Sulfate (PROAIR RESPICLICK) 108 (90 Base) MCG/ACT AEPB Inhale 1 puff into the lungs every 6 (six) hours as needed (shortness of breath). 1 each 2  . amLODipine (NORVASC) 10 MG tablet Take 1 tablet (10 mg total) by mouth daily. 30 tablet 3  . aspirin 81 MG tablet Take 1 tablet (81 mg total) by mouth daily.    Marland Kitchen atorvastatin (LIPITOR) 20 MG tablet Take 1 tablet (20 mg total) by mouth daily. 90 tablet 3  . clopidogrel (PLAVIX) 75 MG tablet Take 1 tablet (75 mg total) by mouth daily. 90 tablet 3  . FLUoxetine (PROZAC) 20 MG tablet Take 1 tablet (20 mg total) by mouth daily. 30 tablet 3  . furosemide (LASIX) 20 MG tablet Take 1 tablet (20 mg total) by mouth daily as needed for fluid or edema. 30 tablet 2  . isosorbide mononitrate (IMDUR)  30 MG 24 hr tablet Take 2 tablets (60 mg total) by mouth 2 (two) times daily. 60 tablet 3  . meloxicam (MOBIC) 7.5 MG tablet Take 1 tablet (7.5 mg total) by mouth daily. 30 tablet 0  . metFORMIN (GLUCOPHAGE) 1000 MG tablet Take 1 tablet (1,000 mg total) by mouth 2 (two) times daily with a meal. 180 tablet 1  . metoprolol (LOPRESSOR) 100 MG tablet Take 1 tablet (100 mg total) by mouth 2 (two) times daily. 60 tablet 2  . mometasone (ASMANEX) 220 MCG/INH inhaler Inhale 2 puffs into the lungs daily. 1 Inhaler 2  . nitroGLYCERIN (NITROSTAT) 0.4 MG SL tablet Place 1 tablet (0.4 mg total) under the tongue every 5 (five) minutes as needed for chest pain. 25 tablet 11  . pantoprazole (PROTONIX) 40 MG tablet Take 1 tablet (40 mg total) by mouth daily. 30 tablet 11  . ranolazine (RANEXA) 1000 MG SR tablet Take 1 tablet (1,000 mg total) by mouth 2 (two) times daily. (Patient not taking: Reported on 09/20/2016) 180 tablet 3  . traMADol (ULTRAM) 50 MG tablet Take 1 tablet (50 mg total) by mouth daily. Reported on 05/31/2016 60 tablet 2  . traZODone (DESYREL) 50 MG tablet Take 1 tablet (50 mg total) by mouth at bedtime as needed for sleep. 30 tablet 3  . valsartan-hydrochlorothiazide (DIOVAN HCT) 320-25 MG tablet Take 1 tablet by mouth daily. 90 tablet 1   No current facility-administered medications on file prior to visit.     Allergies  Allergen Reactions  . Iohexol     PT. TO BE PREMEDICATED PRIOR TO IV CONTRAST PER DR Eppie Gibson /MMS//12/15/15Desc: PT BECAME SOB AND CHEST TIGHTNESS AFTER CONTRAST INJECTION.  [name],RT-RCT., Onset Date: [id number]   . Vicodin [Hydrocodone-Acetaminophen] Itching and Rash    There were no vitals taken for this visit.   Assessment/Plan:   Thank you, Freddie Apley. Cleatis Polka, PharmD  [facility]  10/04/2016 8:02 AM     This encounter was created in error - please disregard.

## 2016-10-12 ENCOUNTER — Telehealth: Payer: Self-pay | Admitting: Internal Medicine

## 2016-10-12 NOTE — Telephone Encounter (Signed)
Lamaria calling from [facility] regarding needing patients heart condition-pls call

## 2016-10-12 NOTE — Telephone Encounter (Signed)
Spoke with Vevelyn Francois from [facility]. She states that pt is looking to schedule an appointment to have a 1 tooth extracted. Vevelyn Francois is only calling to get clearance for procedure in regards to BP only. Pt was there on 09-19-16 and his blood pressure was high and he stated that he was taking his BP medications as ordered (per Lamaria) and his BP was still high. Ok to schedule tooth extraction? Please fax clearance to [phone] Attn: Lamaria.

## 2016-10-13 NOTE — Telephone Encounter (Signed)
I'm not sure what blood pressure the dentist feels comfortable with. If anesthesia is required, would not proceed if SBP >180. If this has been the case, we need to see him (midlevel) for medication adjustment.  Dr. Lemmie Evens

## 2016-10-13 NOTE — Telephone Encounter (Signed)
Spoke with pt states that he does not have BP monitor but he takes it at [facility] everyday and yesterday it was 161/120. I informed pt that is high he has appt with [facility] 10-21-16 to discuss BP

## 2016-10-13 NOTE — Telephone Encounter (Signed)
[place] notified, faxed message

## 2016-10-21 ENCOUNTER — Encounter: Payer: Self-pay | Admitting: Pharmacist

## 2016-10-21 ENCOUNTER — Ambulatory Visit (INDEPENDENT_AMBULATORY_CARE_PROVIDER_SITE_OTHER): Payer: BC Managed Care – PPO | Admitting: Pharmacist

## 2016-10-21 VITALS — BP 148/88 | HR 75

## 2016-10-21 DIAGNOSIS — I10 Essential (primary) hypertension: Secondary | ICD-10-CM

## 2016-10-21 MED ORDER — HYDRALAZINE HCL 25 MG PO TABS: 25.0000 mg | ORAL_TABLET | Freq: Two times a day (BID) | ORAL | 3 refills | Status: DC

## 2016-10-21 NOTE — Progress Notes (Signed)
Patient ID: Andrew West                 DOB: 04-Jun-1966                      MRN: [id number]     HPI: Andrew West is a 50 y.o. male patient of Dr. Rennis Golden with PMH below who presents today for hypertension follow up. Pt was seen by Dr. Rennis Golden recently and has continuous chest pain to due occluded OM2 which has no good revascularization options. Dr. Rennis Golden also noted that his chest pain sounded neuropathic in nature and the pt had a Myoview that was negative for ischemia. At this visit, his BP was elevated so amlodipine 2.5 mg daily was started. Since then his amlodipine was titrated to 5mg  daily by me and 10mg  daily by primary care.   He states that he has been doing everything that we have asked of him but he still feels miserable. He states he knows his body and something is wrong with the chest pain he is experiencing. He states he has completely stopped eating salt and takes his medications all as prescribed. He feels that the medications are not helping. He states his pressure remains high despite our best efforts of increasing them and adding additional medications and he is just tired of feeling this way. He is requesting further work up and a call directly from Dr. Rennis Golden.   He does report that this morning he tried taking vinegar because he heard that it helps with blood pressure.   He reports using his albuterol at least twice per day.   Cardiac Hx: HTN, CAD with multiple PCI, hx of NSTEMI, COPD with asthma, DM, obesity, HLD  Current HTN meds:  Metoprolol 100mg  BID Valsartan/HCTZ 320/25mg  daily QAM Furosemide 20mg  daily  Isosorbide mononitrate 60mg  BID Amlodipine 10mg  daily QPM  Previously tried: none  BP goal: <130/80  Family History: Dad-HTN, DM, heart problems, passed of massive stroke at 37 YO. Mom-low BP problems, cancer and DM. Had 2 brothers and 2 sisters that all had HTN and have all passed of heart problems.   Social History: Pt reports that he was a former  smoker but quit 3-4 years ago. Pt denies any smokeless tobacco or alcohol.  Diet: Pt states he eats most of his meals from home but denies salts/seasonings. He uses [employer] for cooking. He rarely drinks coffee or soda/tea.  Exercise: Pt use to go to the gym 2-3 times/week but has cut back to 1 time/week because of increasing chest pain. Usually walks 2-3 minutes on the treadmill but has to stop due to SOB and also uses free weights.  Home BP readings: He reports that his pressure has been running high at 150s/110s sometimes higher. He does not have a home cuff so he usually goes to [facility] to monitor or has a friend check it.   Wt Readings from Last 3 Encounters:  09/20/16 264 lb 9.6 oz (120 kg)  09/15/16 241 lb (109.3 kg)  08/23/16 259 lb (117.5 kg)   BP Readings from Last 3 Encounters:  10/21/16 (!) 148/88  09/20/16 (!) 154/93  09/15/16 (!) 175/105   Pulse Readings from Last 3 Encounters:  10/21/16 75  09/20/16 85  09/15/16 81    Renal function: CrCl cannot be calculated (Patient's most recent lab result is older than the maximum 21 days allowed.).  Past Medical History:  Diagnosis Date  . CAD (coronary artery  disease)    2002; treated with stent to mid L circumflex  . Chronic back pain   . Chronic leg pain    bilateral  . Depression   . Diabetes mellitus   . GERD (gastroesophageal reflux disease)    Takes Dexilant  . HLD (hyperlipidemia)   . Hypertension   . Rhabdomyolysis    h/o, r/t statins    Current Outpatient Prescriptions on File Prior to Visit  Medication Sig Dispense Refill  . acetaminophen (TYLENOL) 500 MG tablet Take 1,000 mg by mouth every 6 (six) hours as needed for headache.    . Albuterol Sulfate (PROAIR RESPICLICK) 108 (90 Base) MCG/ACT AEPB Inhale 1 puff into the lungs every 6 (six) hours as needed (shortness of breath). 1 each 2  . aspirin 81 MG tablet Take 1 tablet (81 mg total) by mouth daily.    . clopidogrel (PLAVIX) 75 MG tablet Take 1 tablet  (75 mg total) by mouth daily. 90 tablet 3  . FLUoxetine (PROZAC) 20 MG tablet Take 1 tablet (20 mg total) by mouth daily. 30 tablet 3  . furosemide (LASIX) 20 MG tablet Take 1 tablet (20 mg total) by mouth daily as needed for fluid or edema. 30 tablet 2  . isosorbide mononitrate (IMDUR) 30 MG 24 hr tablet Take 2 tablets (60 mg total) by mouth 2 (two) times daily. 60 tablet 3  . meloxicam (MOBIC) 7.5 MG tablet Take 1 tablet (7.5 mg total) by mouth daily. 30 tablet 0  . metFORMIN (GLUCOPHAGE) 1000 MG tablet Take 1 tablet (1,000 mg total) by mouth 2 (two) times daily with a meal. 180 tablet 1  . metoprolol (LOPRESSOR) 100 MG tablet Take 1 tablet (100 mg total) by mouth 2 (two) times daily. 60 tablet 2  . pantoprazole (PROTONIX) 40 MG tablet Take 1 tablet (40 mg total) by mouth daily. 30 tablet 11  . ranolazine (RANEXA) 1000 MG SR tablet Take 1 tablet (1,000 mg total) by mouth 2 (two) times daily. 180 tablet 3  . traZODone (DESYREL) 50 MG tablet Take 1 tablet (50 mg total) by mouth at bedtime as needed for sleep. 30 tablet 3  . valsartan-hydrochlorothiazide (DIOVAN HCT) 320-25 MG tablet Take 1 tablet by mouth daily. 90 tablet 1  . amLODipine (NORVASC) 10 MG tablet Take 1 tablet (10 mg total) by mouth daily. 30 tablet 3  . atorvastatin (LIPITOR) 20 MG tablet Take 1 tablet (20 mg total) by mouth daily. (Patient not taking: Reported on 10/21/2016) 90 tablet 3  . mometasone (ASMANEX) 220 MCG/INH inhaler Inhale 2 puffs into the lungs daily. (Patient not taking: Reported on 10/21/2016) 1 Inhaler 2  . nitroGLYCERIN (NITROSTAT) 0.4 MG SL tablet Place 1 tablet (0.4 mg total) under the tongue every 5 (five) minutes as needed for chest pain. (Patient not taking: Reported on 10/21/2016) 25 tablet 11  . traMADol (ULTRAM) 50 MG tablet Take 1 tablet (50 mg total) by mouth daily. Reported on 05/31/2016 (Patient not taking: Reported on 10/21/2016) 60 tablet 2   No current facility-administered medications on file prior  to visit.     Allergies  Allergen Reactions  . Iohexol     PT. TO BE PREMEDICATED PRIOR TO IV CONTRAST PER DR Eppie Gibson /MMS//12/15/15Desc: PT BECAME SOB AND CHEST TIGHTNESS AFTER CONTRAST INJECTION.  [name],RT-RCT., Onset Date: [id number]   . Vicodin [Hydrocodone-Acetaminophen] Itching and Rash    Blood pressure (!) 148/88, pulse 75.   Assessment/Plan: Hypertension: BP today not at goal, though it is  significantly lower than reported home pressures. His biggest concern today is that he feels he needs further work up for his chest pain and increasing blood pressure today. He is requesting additional test be run to "figure out what is wrong with him." Informed him I would pass this information to Dr. Rennis Golden. Discussed options for additional blood pressure control at length. Suggested change from metoprolol to carvedilol and pt declined as he recently picked up a supply of this medication. He is willing to try hydralazine 25mg  BID. He plans to purchase cuff and monitor at home. Follow up with Dr. Rennis Golden in Jan as scheduled and hypertension clinic thereafter for additional medication titration.    Thank you, Freddie Apley. Cleatis Polka, PharmD  [facility]  10/21/2016 10:02 AM

## 2016-10-21 NOTE — Patient Instructions (Addendum)
Return for a follow up appointment as scheduled with Dr. Debara Pickett  Check your blood pressure at home daily (if able) and keep record of the readings.  Take your BP meds as follows: START taking hydralazine '25mg'$  twice a day  CONTINUE all other medications as prescribed   Bring all of your meds, your BP cuff and your record of home blood pressures to your next appointment.  Exercise as you're able, try to walk approximately 30 minutes per day.  Keep salt intake to a minimum, especially watch canned and prepared boxed foods.  Eat more fresh fruits and vegetables and fewer canned items.  Avoid eating in fast food restaurants.    HOW TO TAKE YOUR BLOOD PRESSURE: . Rest 5 minutes before taking your blood pressure. .  Don't smoke or drink caffeinated beverages for at least 30 minutes before. . Take your blood pressure before (not after) you eat. . Sit comfortably with your back supported and both feet on the floor (don't cross your legs). . Elevate your arm to heart level on a table or a desk. . Use the proper sized cuff. It should fit smoothly and snugly around your bare upper arm. There should be enough room to slip a fingertip under the cuff. The bottom edge of the cuff should be 1 inch above the crease of the elbow. . Ideally, take 3 measurements at one sitting and record the average.

## 2016-11-02 ENCOUNTER — Ambulatory Visit: Payer: Self-pay | Admitting: Internal Medicine

## 2016-11-03 ENCOUNTER — Encounter: Payer: Self-pay | Admitting: Internal Medicine

## 2016-11-03 ENCOUNTER — Ambulatory Visit (INDEPENDENT_AMBULATORY_CARE_PROVIDER_SITE_OTHER): Payer: BC Managed Care – PPO | Admitting: Internal Medicine

## 2016-11-03 VITALS — BP 150/105 | HR 86 | Ht 68.5 in | Wt 260.0 lb

## 2016-11-03 DIAGNOSIS — R0609 Other forms of dyspnea: Secondary | ICD-10-CM

## 2016-11-03 DIAGNOSIS — I25118 Atherosclerotic heart disease of native coronary artery with other forms of angina pectoris: Secondary | ICD-10-CM | POA: Diagnosis not present

## 2016-11-03 DIAGNOSIS — I10 Essential (primary) hypertension: Secondary | ICD-10-CM | POA: Diagnosis not present

## 2016-11-03 MED ORDER — NEBIVOLOL HCL 20 MG PO TABS: 20.0000 mg | ORAL_TABLET | Freq: Every day | ORAL | 3 refills | Status: DC

## 2016-11-03 MED ORDER — NEBIVOLOL HCL 20 MG PO TABS: 20.0000 mg | ORAL_TABLET | Freq: Every day | ORAL | 5 refills | Status: DC

## 2016-11-03 MED ORDER — HYDRALAZINE HCL 50 MG PO TABS: 50.0000 mg | ORAL_TABLET | Freq: Two times a day (BID) | ORAL | 3 refills | Status: DC

## 2016-11-03 NOTE — Patient Instructions (Addendum)
Your physician has recommended you make the following change in your medication:  -- INCREASE hydralazine to '50mg'$  twice daily -- START BYSTOLIC '20mg'$  once daily (STOP metoprolol)  -- samples x4 bottles provided to patient  -- patient assistance application to be completed  Please continue to monitor your BP at home.   Your physician recommends that you schedule a follow-up appointment January 2018 with Dr. Debara Pickett

## 2016-11-03 NOTE — Progress Notes (Signed)
OFFICE NOTE  Chief Complaint:  Chest pain, dyspnea  Primary Care Physician: Arnoldo Morale, MD  HPI:  Andrew West  is a 50 year old gentleman with a history of coronary artery disease and stent placement to the circumflex and obtuse marginal and a history of in stent restenosis to the LAD. There is also a nondominant right coronary artery that is 100% occluded, filled with collaterals. He did have a stress test recently, which showed an EF of 53% and reversible septal ischemia in the setting of chest pain and after much convincing underwent cardiac catheterization.  He then underwent cutting balloon angioplasty to an ostial lateral OM1 branch and the bifurcation AV groove circumflex OM junction. This was successful at reducing the stenosis to 0%. This was in November 2012 and he did not return for followup appointment until April 2013. He was suffering from low back pain and has been evaluated and treated by Dr. Trenton Gammon with a laminectomy/discectomy, which he says was not helpful. Otherwise, he continues to smoke and occasionally complains of shortness of breath and some chest pain symptoms which are atypical. He is on long-acting and/or has not needed to take short-acting nitroglycerin. His other concern today is that he is having problems with his teeth and was recommended to have edentulation and by an oral surgeon Dr. Diona Browner. Finally, he is suffering from significant stress and depression due to recent separation with wife in dealing with his kids. This seems to be a big tablets on his continued smoking.  Andrew West returns today in the office. He feels fairly well. He denies any chest pain. He continues to have problems with low back pain and numbness and tingling in his legs. He is apparently status post laminectomy and microdiscectomy by Dr. Trenton Gammon and continues to have symptoms which may be related to that. He's also complaining of what sounds like neuropathic pain. He is  not currently on medication. He is asking for Tylenol 3 for pain today which I told him I did not prescribe. Ultimately there are no further surgical or nonsurgical options, he may need to go on a neuropathic pain medication or perhaps be referred to a pain management specialist. He is also looking for a new primary care provider as his primary care provider is close to retirement. He reports he has cut back his smoking to about 1 pack every 2 weeks. He is also been out of amlodipine and simvastatin although his blood pressure is well controlled.  I saw Andrew West in the office today. He is reporting now complains of shortness of breath and chest pain. He also feels like his breathing is worse when working around chemicals at work. He uses a respiratory protection mask but also feels like he is under significant stress. He's had missed several days of work and is concerned about his job. Sounds like he is in a difficult work environment. He says that he is apologizing for "deceiving me" that he was not honest about his symptoms. He apparently has been having shortness of breath and chest pain for several months and did not relay that to me.  Andrew West returns to the office today for follow-up. His main complaint is shortness of breath. He's not feeling as much chest pain he's had been previously. We recently did a nuclear stress test which was negative for ischemia and showed an EF of 56%. With regards to shortness of breath he's concerned most of this was related to chemicals at work although  he has not worked in several months. He's had about 20 pound weight gain since we last saw him in the office and denies any lower extremity swelling, orthopnea or PND. Shortness of breath is persistent and is associated with some wheezing. He did see Dr. 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 Shortness of breath seems to be a little bit worse with exertion which makes me wonder whether there is an element of exercise-induced pulmonary hypertension. He's not had an echocardiogram in some time.  Andrew West returns today for follow-up. He was noted to have some diastolic dysfunction on his echocardiogram. I started him on low-dose Lasix and check lab work including a BNP which is very low around 50. On follow-up today he reports he feels no different with the addition of Lasix. I therefore asked him to take it as needed. He is also describing worsening substernal chest discomfort and a squeezing pressure in his chest today. He says this is been getting worse over the past several days, more than his typical chest discomfort. As previously noted he recently underwent a nuclear stress test a few months ago which was negative for ischemia.   Andrew West returns for follow-up. As mentioned he had had recent progressive chest pain symptoms and worsening shortness of breath. He underwent another cardiac catheterization which demonstrated the following:   Ost 2nd Mrg to 2nd Mrg lesion, 99% stenosed. Post intervention, 99% residual stenosis remained. The lesion was previously treated with a bare metal stentgreater than two years ago.  Mid Cx-2 lesion, 90% stenosed. Post intervention, there is a 20% residual stenosis.  Mid Cx-1 lesion, 80% stenosed. Post intervention, there is a 50% residual stenosis. The lesion was previously treated with a bare metal stent.  Ramus lesion, 100% stenosed. The lesion was previously treated with a bare metal stentgreater than two years ago.  Prox RCA lesion, 100% stenosed.  There is mild left ventricular systolic dysfunction.  Moderately elevated LVEDP of 20 mmHg   Difficult situation with what amounts to be a totally occluded (In-stent re-stenosis) of the OM2 branch with sub-optimal PTCA of the mid AV-Groove In-stent restenosis. He  continues to have chest pain although reports it somewhat improved. He was started on ranolazine and currently is on 1000 mg twice a day.  Andrew West returns today for follow-up. He reports he is doing fairly well on medical therapy. He still gets some sharp chest pain mostly along the right sternal border. He does get short of breath with moderate exertion. He's trying to do some walking on a treadmill but generally stops the exercise once he gets short of breath because he is very nervous. He is not currently working due to combined cardiac pulmonary and musculoskeletal diseases. He says that he and his wife are going to go on a delayed honeymoon since they never took one.  04/20/2016  Andrew West returns today for follow-up. He was recently seen in the hospital in the beginning of May for chest pain. He reports that it's up in the left neck base around the area of the left clavicle. It's very exquisitely tender to touch and feels sharp and electric. This pain is related we think to cervical pain. He ruled out for MI and was not worked up further for coronary ischemia. This pain is felt to be distinctly different. His isosorbide was  increased up to 60 mg twice a day but he notes no change in his symptoms with that. His primary care providers hesitant to provide any pain medication.  06/28/2016  Andrew West was seen back today in follow-up. He recently was in the ER at [facility] the left without being seen after 3 hours. An EKG was performed and showed no ischemic changes. He reported going home and taking some nitroglycerin and aspirin with some eventual improvement in his symptoms. He continues to have a sharp left chest discomfort which is tender to the touch and is persistent. It's not necessarily worse with exertion or relieved by rest. I felt this may likely be a neuropathic pain and I recommended starting gabapentin 300 mg daily at bedtime. He reports no improvement with this medication. I advised him to  discontinue today. He's also concerned about poor sleep at night, significant preoccupation with death and anxiety. This is concerning for possible PTSD type symptoms.  08/23/2016  Andrew West returns today for follow-up. I referred him to behavioral health and he says that he went but it was not helpful. The medical record however does not show any evidence that he made that appointment. He did present to the emergency department on 06/29/2016 with depression and anxiety and was felt not to need inpatient treatment. He underwent nuclear stress testing which was negative for ischemia and showed normal LV function. I do not believe is ongoing chest pain at this time is due to ischemia. Blood pressure is elevated today however 142/102. A recheck was 138/89.  11/03/2016  I saw Andrew West today for follow-up again. He is complaining of persistent chest pain which is chronic, tends to be present for most of the day at rest and with exertion. He also complains of shortness of breath. Surprisingly blood pressure is elevated today, much higher than it has been in the past at 150/105. He reports compliance with his medications. This could be contributing to his problems. He is interested in another heart catheterization. I did remind him that we performed a stress test only 3 months ago which was negative for ischemia. His last coronary intervention was about a year ago which she had some percutaneous angioplasty but no new stent placement. He has complex anatomy that was not amenable to PCI, rather medical therapy was recommended however he has been on maximal doses of nitrates, Ranexa, beta blocker and other blood pressure medications. Interestingly, despite a high dose of metoprolol, his heart rate remains up in the 80s. EKG today is nonischemic.  PMHx:  Past Medical History:  Diagnosis Date  . CAD (coronary artery disease)    2002; treated with stent to mid L circumflex  . Chronic back pain   . Chronic  leg pain    bilateral  . Depression   . Diabetes mellitus   . GERD (gastroesophageal reflux disease)    Takes Dexilant  . HLD (hyperlipidemia)   . Hypertension   . Rhabdomyolysis    h/o, r/t statins    Past Surgical History:  Procedure Laterality Date  . CARDIAC CATHETERIZATION N/A 09/25/2015   Procedure: Left Heart Cath and Coronary Angiography;  Surgeon: Leonie Man, MD;  Location: [facility];  Service: Cardiovascular;  Laterality: N/A;  . COLONOSCOPY W/ POLYPECTOMY    . CORONARY ANGIOPLASTY  09/25/2015   mid cir & om  . CORONARY ANGIOPLASTY WITH STENT PLACEMENT  10/09/2001   PTCA & stenting of mid AV circumflex; 2.5x89m Pixel stent  .  CORONARY ANGIOPLASTY WITH STENT PLACEMENT  12/13/2001   PCI with stent to mid L circumflex, 95% stenosis to 0% residual  . CORONARY ANGIOPLASTY WITH STENT PLACEMENT  10/10/2003   PCI to mid AV circumflex; LAD 30% disease; RCA 100% occluded prox.  . CORONARY ANGIOPLASTY WITH STENT PLACEMENT  09/01/2011   PCI with stenting with bare metal stent to mid AV groove circumflex and PDA  . CORONARY ANGIOPLASTY WITH STENT PLACEMENT  10/17/2011   cutting balloon angioplasty of ostial lateral OM1 branch and bifurcation AV groove circumflex OM junction; stenosis reduced to 0%  . LEFT HEART CATHETERIZATION WITH CORONARY ANGIOGRAM N/A 10/18/2011   Procedure: LEFT HEART CATHETERIZATION WITH CORONARY ANGIOGRAM;  Surgeon: Leonie Man, MD;  Location: [facility] CATH LAB;  Service: Cardiovascular;  Laterality: N/A;  . LUMBAR LAMINECTOMY/DECOMPRESSION MICRODISCECTOMY  03/31/2012   Procedure: LUMBAR LAMINECTOMY/DECOMPRESSION MICRODISCECTOMY 1 LEVEL;  Surgeon: Charlie Pitter, MD;  Location: [facility];  Service: Neurosurgery;  Laterality: Left;  . TRANSTHORACIC ECHOCARDIOGRAM  07/28/2011   EF 55-65%; LVH, grade 1 diastolic dysfunction;     FAMHx:  Family History  Problem Relation Age of Onset  . Heart attack Father   . Hypertension Mother   . Diabetes Mother     . Heart disease Brother     x 3   . Heart attack Brother     deceased  . Hypertension Sister   . Diabetes Sister   . Anesthesia problems Neg Hx   . Hypotension Neg Hx   . Malignant hyperthermia Neg Hx   . Pseudochol deficiency Neg Hx     SOCHx:   reports that he quit smoking about 17 months ago. His smoking use included Cigarettes. He has a 6.25 pack-year smoking history. He has never used smokeless tobacco. He reports that he does not drink alcohol or use drugs.  ALLERGIES:  Allergies  Allergen Reactions  . Iohexol     PT. TO BE PREMEDICATED PRIOR TO IV CONTRAST PER DR Kris Hartmann /MMS//12/15/15Desc: PT BECAME SOB AND CHEST TIGHTNESS AFTER CONTRAST INJECTION.  [name],RT-RCT., Onset Date: [id number]   . Vicodin [Hydrocodone-Acetaminophen] Itching and Rash    ROS: Pertinent items noted in HPI and remainder of comprehensive ROS otherwise negative.  HOME MEDS: Current Outpatient Prescriptions  Medication Sig Dispense Refill  . acetaminophen (TYLENOL) 500 MG tablet Take 1,000 mg by mouth every 6 (six) hours as needed for headache.    . Albuterol Sulfate (PROAIR RESPICLICK) 237 (90 Base) MCG/ACT AEPB Inhale 1 puff into the lungs every 6 (six) hours as needed (shortness of breath). 1 each 2  . amLODipine (NORVASC) 10 MG tablet Take 1 tablet (10 mg total) by mouth daily. 30 tablet 3  . aspirin 81 MG tablet Take 1 tablet (81 mg total) by mouth daily.    Marland Kitchen atorvastatin (LIPITOR) 20 MG tablet Take 1 tablet (20 mg total) by mouth daily. (Patient not taking: Reported on 10/21/2016) 90 tablet 3  . clopidogrel (PLAVIX) 75 MG tablet Take 1 tablet (75 mg total) by mouth daily. 90 tablet 3  . FLUoxetine (PROZAC) 20 MG tablet Take 1 tablet (20 mg total) by mouth daily. 30 tablet 3  . furosemide (LASIX) 20 MG tablet Take 1 tablet (20 mg total) by mouth daily as needed for fluid or edema. 30 tablet 2  . hydrALAZINE (APRESOLINE) 25 MG tablet Take 1 tablet (25 mg total) by mouth 2 (two) times  daily. 180 tablet 3  . isosorbide mononitrate (IMDUR) 30 MG 24 hr  tablet Take 2 tablets (60 mg total) by mouth 2 (two) times daily. 60 tablet 3  . meloxicam (MOBIC) 7.5 MG tablet Take 1 tablet (7.5 mg total) by mouth daily. 30 tablet 0  . metFORMIN (GLUCOPHAGE) 1000 MG tablet Take 1 tablet (1,000 mg total) by mouth 2 (two) times daily with a meal. 180 tablet 1  . metoprolol (LOPRESSOR) 100 MG tablet Take 1 tablet (100 mg total) by mouth 2 (two) times daily. 60 tablet 2  . mometasone (ASMANEX) 220 MCG/INH inhaler Inhale 2 puffs into the lungs daily. (Patient not taking: Reported on 10/21/2016) 1 Inhaler 2  . nitroGLYCERIN (NITROSTAT) 0.4 MG SL tablet Place 1 tablet (0.4 mg total) under the tongue every 5 (five) minutes as needed for chest pain. (Patient not taking: Reported on 10/21/2016) 25 tablet 11  . pantoprazole (PROTONIX) 40 MG tablet Take 1 tablet (40 mg total) by mouth daily. 30 tablet 11  . ranolazine (RANEXA) 1000 MG SR tablet Take 1 tablet (1,000 mg total) by mouth 2 (two) times daily. 180 tablet 3  . traMADol (ULTRAM) 50 MG tablet Take 1 tablet (50 mg total) by mouth daily. Reported on 05/31/2016 (Patient not taking: Reported on 10/21/2016) 60 tablet 2  . traZODone (DESYREL) 50 MG tablet Take 1 tablet (50 mg total) by mouth at bedtime as needed for sleep. 30 tablet 3  . valsartan-hydrochlorothiazide (DIOVAN HCT) 320-25 MG tablet Take 1 tablet by mouth daily. 90 tablet 1   No current facility-administered medications for this visit.     LABS/IMAGING: No results found for this or any previous visit (from the past 48 hour(s)). No results found.  VITALS: There were no vitals taken for this visit.  EXAM: General appearance: alert, mild distress and moderately obese Neck: no carotid bruit and no JVD Lungs: clear to auscultation bilaterally Heart: regular rate and rhythm Abdomen: soft, non-tender; bowel sounds normal; no masses,  no organomegaly and Obese Extremities: extremities  normal, atraumatic, no cyanosis or edema Pulses: 2+ and symmetric Skin: Skin color, texture, turgor normal. No rashes or lesions Neurologic: Grossly normal Psych: Mildly anxious  EKG: Sinus rhythm at 81  ASSESSMENT:  1. Chest pain and dyspnea at rest and with exertion - Occluded OM2 at a prior stent - no good       revascularization options - low risk Myoview with no ischemia (07/2016) 2. Coronary artery disease status post PCI and cutting balloon angioplasty over 2 years ago 3. Ongoing tobacco abuse 4. Uncontrolled hypertension 5. Dyslipidemia 6. Neuropathy 7. Persistent low back pain 8. Diabetes type 2 9. Morbid obesity 10. Anxiety/?PTSD  PLAN: 1.   Mr. Isenberg has a very elevated blood pressure today, which could be contributing to his symptoms. I recommend discontinuing his metoprolol and we'll switch him over to [facility] for better blood pressure control. We gave him samples for 20 mg daily and he will need to apply for patient assistance because he is not yet covered with SSDI, despite being permanently disabled. In addition, will increase his hydralazine to 50 mg twice a day. Plan to see him back for blood pressure assessment in about a month. If his symptoms persist despite improvement in blood pressure, we may need to refer her back for her catheterization although I don't think there are many revascularization options for him.   Pixie Casino, MD, [facility] Attending Cardiologist [facility] 11/03/2016, 8:24 AM

## 2016-11-05 MED FILL — ATORVASTATIN 20 MG TABLET: 20 | 30 days supply | Qty: 30 | Fill #0

## 2016-11-05 MED FILL — metFORMIN HCL 1000 MG TABS: 1000 | 30 days supply | Qty: 60 | Fill #0

## 2016-11-05 MED FILL — VALSARTAN-HCTZ 320-25 MG TA: 320-25 | 30 days supply | Qty: 30 | Fill #1

## 2016-11-05 MED FILL — traZODone HCL 50 MG TABS: 50 | 30 days supply | Qty: 30 | Fill #1

## 2016-11-05 MED FILL — FLUoxetine HCL 20 MG CAPS: 20 | 30 days supply | Qty: 30 | Fill #1

## 2016-11-05 MED FILL — RANEXA ER 1,000 MG TABLET: 1000 | 30 days supply | Qty: 60 | Fill #2

## 2016-11-05 MED FILL — hydrALAZINE HCL 25 MG TABS: 25 | 30 days supply | Qty: 60 | Fill #0

## 2016-11-05 MED FILL — AMLODIPINE BESYLATE 10 MG T: 10 | 30 days supply | Qty: 30 | Fill #1

## 2016-11-08 ENCOUNTER — Telehealth: Payer: Self-pay | Admitting: Internal Medicine

## 2016-11-08 ENCOUNTER — Other Ambulatory Visit: Payer: Self-pay | Admitting: Cardiovascular Disease

## 2016-11-08 DIAGNOSIS — I10 Essential (primary) hypertension: Secondary | ICD-10-CM

## 2016-11-08 MED ORDER — CLOPIDOGREL BISULFATE 75 MG PO TABS: 75.0000 mg | ORAL_TABLET | Freq: Every day | ORAL | 3 refills | Status: DC

## 2016-11-08 MED ORDER — NEBIVOLOL HCL 20 MG PO TABS: 20.0000 mg | ORAL_TABLET | Freq: Every day | ORAL | 3 refills | Status: DC

## 2016-11-08 MED FILL — PANTOPRAZOLE SOD DR 40 MG T: 40 | 60 days supply | Qty: 60 | Fill #4

## 2016-11-08 MED FILL — CLOPIDOGREL 75 MG TABLET: 75 | 30 days supply | Qty: 30 | Fill #0

## 2016-11-08 MED FILL — BYSTOLIC 20 MG TABLET: 20 | 30 days supply | Qty: 30 | Fill #0

## 2016-11-08 MED FILL — VALSARTAN-HCTZ 320-25 MG TA: 320-25 | 30 days supply | Qty: 30 | Fill #2

## 2016-11-08 NOTE — Telephone Encounter (Signed)
Pt called to speak to [facility], would not say what is was regarding

## 2016-11-08 NOTE — Telephone Encounter (Signed)
Patient called in - he reports his BP this AM 168/127 He is aware it will take time for bystolic to take effect  He was calling to request refills for plavix to be sent to [facility] along with Rx for bystolic Rx(s) sent to pharmacy electronically.

## 2016-11-19 ENCOUNTER — Encounter: Payer: Self-pay | Admitting: Family Medicine

## 2016-11-19 ENCOUNTER — Ambulatory Visit: Payer: BC Managed Care – PPO | Attending: Family Medicine | Admitting: Family Medicine

## 2016-11-19 ENCOUNTER — Telehealth: Payer: Self-pay | Admitting: Family Medicine

## 2016-11-19 VITALS — BP 110/73 | HR 80 | Temp 99.6°F | Ht 68.5 in | Wt 259.4 lb

## 2016-11-19 DIAGNOSIS — Z79899 Other long term (current) drug therapy: Secondary | ICD-10-CM | POA: Diagnosis not present

## 2016-11-19 DIAGNOSIS — Z885 Allergy status to narcotic agent status: Secondary | ICD-10-CM | POA: Diagnosis not present

## 2016-11-19 DIAGNOSIS — Z7982 Long term (current) use of aspirin: Secondary | ICD-10-CM | POA: Diagnosis not present

## 2016-11-19 DIAGNOSIS — N3001 Acute cystitis with hematuria: Secondary | ICD-10-CM | POA: Insufficient documentation

## 2016-11-19 DIAGNOSIS — Z7984 Long term (current) use of oral hypoglycemic drugs: Secondary | ICD-10-CM | POA: Diagnosis not present

## 2016-11-19 DIAGNOSIS — Z0001 Encounter for general adult medical examination with abnormal findings: Secondary | ICD-10-CM | POA: Diagnosis present

## 2016-11-19 DIAGNOSIS — R310 Gross hematuria: Secondary | ICD-10-CM | POA: Diagnosis not present

## 2016-11-19 LAB — POCT URINALYSIS DIPSTICK
GLUCOSE UA: NEGATIVE
NITRITE UA: POSITIVE
Protein, UA: >=300
Spec Grav, UA: 1.02
Urobilinogen, UA: 1
pH, UA: 5.5

## 2016-11-19 MED ORDER — ACETAMINOPHEN-CODEINE #3 300-30 MG PO TABS: 1.0000 | ORAL_TABLET | Freq: Two times a day (BID) | ORAL | 0 refills | Status: DC | PRN

## 2016-11-19 MED ORDER — CIPROFLOXACIN HCL 500 MG PO TABS: 500.0000 mg | ORAL_TABLET | Freq: Two times a day (BID) | ORAL | 0 refills | Status: DC

## 2016-11-19 MED ORDER — ACETAMINOPHEN-CODEINE #3 300-30 MG PO TABS
1.0000 | ORAL_TABLET | Freq: Two times a day (BID) | ORAL | 0 refills | Status: DC | PRN
Start: 2016-11-19 — End: 2016-11-19

## 2016-11-19 NOTE — Telephone Encounter (Signed)
Patient coming over for an appt today per Dr. Jarold Song.

## 2016-11-19 NOTE — Progress Notes (Signed)
Subjective:  Patient ID: Andrew West, male    DOB: [birth year]  Age: 50 y.o. MRN: [id number]  CC: Hematuria; Back Pain; and pain when urinating   HPI AEMON FALLER is a 50 y.o. with a  Medical history of Type 2 DM (A1c 6.0),CAD s/p PCI/stent to LCx and OM1In 09/2015; cutting balloon angioplasty to OM 1, HLD, HTN, chronic back pain s/p laminectomy/discectomy (3 years ago by Dr Dutch Quint), depression who presents today for an acute visit.  He complains of a 2 day history of hematuria, dysuria, left flank pain and associated straining to pass urine. He endorses lower abdominal pain but denies nausea, vomiting or fevers. He denies passage of large volumes of blood but states he sees some small amount of blood when he strains.  He has chronic back pain with an appointment with pain management on 11/27/15; he is requesting a prescription for Tylenol with Codeine until then.  Past Medical History:  Diagnosis Date  . CAD (coronary artery disease)    2002; treated with stent to mid L circumflex  . Chronic back pain   . Chronic leg pain    bilateral  . Depression   . Diabetes mellitus   . GERD (gastroesophageal reflux disease)    Takes Dexilant  . HLD (hyperlipidemia)   . Hypertension   . Rhabdomyolysis    h/o, r/t statins    Past Surgical History:  Procedure Laterality Date  . CARDIAC CATHETERIZATION N/A 09/25/2015   Procedure: Left Heart Cath and Coronary Angiography;  Surgeon: Marykay Lex, MD;  Location: [facility] INVASIVE CV LAB;  Service: Cardiovascular;  Laterality: N/A;  . COLONOSCOPY W/ POLYPECTOMY    . CORONARY ANGIOPLASTY  09/25/2015   mid cir & om  . CORONARY ANGIOPLASTY WITH STENT PLACEMENT  10/09/2001   PTCA & stenting of mid AV circumflex; 2.5x10mm Pixel stent  . CORONARY ANGIOPLASTY WITH STENT PLACEMENT  12/13/2001   PCI with stent to mid L circumflex, 95% stenosis to 0% residual  . CORONARY ANGIOPLASTY WITH STENT PLACEMENT  10/10/2003   PCI to mid AV circumflex; LAD 30%  disease; RCA 100% occluded prox.  . CORONARY ANGIOPLASTY WITH STENT PLACEMENT  09/01/2011   PCI with stenting with bare metal stent to mid AV groove circumflex and PDA  . CORONARY ANGIOPLASTY WITH STENT PLACEMENT  10/17/2011   cutting balloon angioplasty of ostial lateral OM1 branch and bifurcation AV groove circumflex OM junction; stenosis reduced to 0%  . LEFT HEART CATHETERIZATION WITH CORONARY ANGIOGRAM N/A 10/18/2011   Procedure: LEFT HEART CATHETERIZATION WITH CORONARY ANGIOGRAM;  Surgeon: Marykay Lex, MD;  Location: [facility] CATH LAB;  Service: Cardiovascular;  Laterality: N/A;  . LUMBAR LAMINECTOMY/DECOMPRESSION MICRODISCECTOMY  03/31/2012   Procedure: LUMBAR LAMINECTOMY/DECOMPRESSION MICRODISCECTOMY 1 LEVEL;  Surgeon: Temple Pacini, MD;  Location: [facility] NEURO ORS;  Service: Neurosurgery;  Laterality: Left;  . TRANSTHORACIC ECHOCARDIOGRAM  07/28/2011   EF 55-65%; LVH, grade 1 diastolic dysfunction;     Allergies  Allergen Reactions  . Iohexol     PT. TO BE PREMEDICATED PRIOR TO IV CONTRAST PER DR Eppie Gibson /MMS//12/15/15Desc: PT BECAME SOB AND CHEST TIGHTNESS AFTER CONTRAST INJECTION.  [name],RT-RCT., Onset Date: [id number]   . Vicodin [Hydrocodone-Acetaminophen] Itching and Rash     Outpatient Medications Prior to Visit  Medication Sig Dispense Refill  . acetaminophen (TYLENOL) 500 MG tablet Take 1,000 mg by mouth every 6 (six) hours as needed for headache.    . Albuterol Sulfate (PROAIR RESPICLICK) 108 (90 Base)  MCG/ACT AEPB Inhale 1 puff into the lungs every 6 (six) hours as needed (shortness of breath). 1 each 2  . aspirin 81 MG tablet Take 1 tablet (81 mg total) by mouth daily.    . clopidogrel (PLAVIX) 75 MG tablet Take 1 tablet (75 mg total) by mouth daily. 90 tablet 3  . FLUoxetine (PROZAC) 20 MG tablet Take 1 tablet (20 mg total) by mouth daily. 30 tablet 3  . furosemide (LASIX) 20 MG tablet Take 1 tablet (20 mg total) by mouth daily as needed for fluid or edema. 30 tablet 2    . hydrALAZINE (APRESOLINE) 50 MG tablet Take 1 tablet (50 mg total) by mouth 2 (two) times daily. 180 tablet 3  . isosorbide mononitrate (IMDUR) 30 MG 24 hr tablet Take 2 tablets (60 mg total) by mouth 2 (two) times daily. 60 tablet 3  . meloxicam (MOBIC) 7.5 MG tablet Take 1 tablet (7.5 mg total) by mouth daily. 30 tablet 0  . metFORMIN (GLUCOPHAGE) 1000 MG tablet Take 1 tablet (1,000 mg total) by mouth 2 (two) times daily with a meal. 180 tablet 1  . mometasone (ASMANEX) 220 MCG/INH inhaler Inhale 2 puffs into the lungs daily. 1 Inhaler 2  . Nebivolol HCl 20 MG TABS Take 1 tablet (20 mg total) by mouth daily. 90 tablet 3  . nitroGLYCERIN (NITROSTAT) 0.4 MG SL tablet Place 1 tablet (0.4 mg total) under the tongue every 5 (five) minutes as needed for chest pain. 25 tablet 11  . pantoprazole (PROTONIX) 40 MG tablet Take 1 tablet (40 mg total) by mouth daily. 30 tablet 11  . ranolazine (RANEXA) 1000 MG SR tablet Take 1 tablet (1,000 mg total) by mouth 2 (two) times daily. 180 tablet 3  . simvastatin (ZOCOR) 20 MG tablet Take 1 tablet by mouth daily.  3  . traZODone (DESYREL) 50 MG tablet Take 1 tablet (50 mg total) by mouth at bedtime as needed for sleep. 30 tablet 3  . valsartan-hydrochlorothiazide (DIOVAN HCT) 320-25 MG tablet Take 1 tablet by mouth daily. 90 tablet 1  . amLODipine (NORVASC) 10 MG tablet Take 1 tablet (10 mg total) by mouth daily. 30 tablet 3   No facility-administered medications prior to visit.     ROS Review of Systems Constitutional: Negative for activity change and appetite change.  HENT: Negative for sinus pressure and sore throat.   Eyes: Negative for visual disturbance.  Respiratory: Negative for cough, chest tightness and shortness of breath.   Cardiovascular: Negative for chest pain and leg swelling.  Gastrointestinal: Negative for abdominal pain, diarrhea, constipation and abdominal distention.  Endocrine: Negative.   Genitourinary:see hpi  Musculoskeletal:  Positive for back pain. Negative for myalgias and joint swelling.  Skin: Negative for rash.  Allergic/Immunologic: Negative.   Neurological: Positive for weakness and numbness. Negative for light-headedness.  Psychiatric/Behavioral: Positive for suicidal ideation and intent  Objective:  BP 110/73 (BP Location: Right Arm, Patient Position: Sitting, Cuff Size: Large)   Pulse 80   Temp 99.6 F (37.6 C) (Oral)   Ht 5' 8.5" (1.74 m)   Wt 259 lb 6.4 oz (117.7 kg)   SpO2 95%   BMI 38.87 kg/m   BP/Weight 11/19/2016 11/03/2016 10/21/2016  Systolic BP 110 150 148  Diastolic BP 73 105 88  Wt. (Lbs) 259.4 260 -  BMI 38.87 38.96 -      Physical Exam Constitutional: He is oriented to person, place, and time. He appears well-developed and well-nourished.  Cardiovascular: Normal rate,  normal heart sounds and intact distal pulses.   No murmur heard. Pulmonary/Chest: Effort normal and breath sounds normal. He has no wheezes. He has no rales. He exhibits no tenderness.  Abdominal: Soft. Bowel sounds are normal. He exhibits no distension and no mass. There is  suprapubic tenderness  Musculoskeletal: He exhibits tenderness (tenderness on palpation of the lumbar spine; positive straight leg raise bilaterally   ).  Left-sided positive CVA tenderness  Reduced range of motion of lumbar spine.  Neurological: He is alert and oriented to person, place, and time.  Psych: normal  Assessment & Plan:   1. Gross hematuria If symptoms persist at his next visit I will order a CT renal stone protocol to exclude nephrolithiasis He may also need urology referral for cystoscopy to exclude other malignancy - Urinalysis Dipstick  2. Acute cystitis with hematuria - ciprofloxacin (CIPRO) 500 MG tablet; Take 1 tablet (500 mg total) by mouth 2 (two) times daily.  Dispense: 20 tablet; Refill: 0   Meds ordered this encounter  Medications  . DISCONTD: acetaminophen-codeine (TYLENOL #3) 300-30 MG tablet    Sig:  Take 1 tablet by mouth every 12 (twelve) hours as needed for moderate pain.    Dispense:  50 tablet    Refill:  0  . ciprofloxacin (CIPRO) 500 MG tablet    Sig: Take 1 tablet (500 mg total) by mouth 2 (two) times daily.    Dispense:  20 tablet    Refill:  0  . acetaminophen-codeine (TYLENOL #3) 300-30 MG tablet    Sig: Take 1 tablet by mouth every 12 (twelve) hours as needed for moderate pain.    Dispense:  50 tablet    Refill:  0    Follow-up: Return in about 3 weeks (around 12/10/2016) for follow up on hematuria.   Jaclyn Shaggy MD

## 2016-11-19 NOTE — Telephone Encounter (Signed)
Pt. Called requesting to speak with his nurse.  Pt. States when he goes to the restroom blood Comes out his penis, he has pain and his back  Hurts. Pt. Also states that his legs feel weak.  Please f/u with pt.

## 2016-11-25 ENCOUNTER — Ambulatory Visit: Payer: Self-pay | Admitting: Internal Medicine

## 2016-12-02 ENCOUNTER — Encounter: Payer: Self-pay | Admitting: Internal Medicine

## 2016-12-02 ENCOUNTER — Ambulatory Visit (INDEPENDENT_AMBULATORY_CARE_PROVIDER_SITE_OTHER): Payer: BC Managed Care – PPO | Admitting: Internal Medicine

## 2016-12-02 ENCOUNTER — Encounter: Payer: Self-pay | Admitting: Cardiology

## 2016-12-02 VITALS — BP 142/91 | HR 82 | Ht 68.0 in | Wt 264.2 lb

## 2016-12-02 DIAGNOSIS — R5383 Other fatigue: Secondary | ICD-10-CM

## 2016-12-02 DIAGNOSIS — F419 Anxiety disorder, unspecified: Secondary | ICD-10-CM

## 2016-12-02 DIAGNOSIS — I200 Unstable angina: Secondary | ICD-10-CM

## 2016-12-02 DIAGNOSIS — I2510 Atherosclerotic heart disease of native coronary artery without angina pectoris: Secondary | ICD-10-CM | POA: Diagnosis not present

## 2016-12-02 DIAGNOSIS — D689 Coagulation defect, unspecified: Secondary | ICD-10-CM | POA: Diagnosis not present

## 2016-12-02 DIAGNOSIS — R0609 Other forms of dyspnea: Secondary | ICD-10-CM

## 2016-12-02 DIAGNOSIS — Z01818 Encounter for other preprocedural examination: Secondary | ICD-10-CM

## 2016-12-02 DIAGNOSIS — Z9861 Coronary angioplasty status: Secondary | ICD-10-CM

## 2016-12-02 DIAGNOSIS — Z79899 Other long term (current) drug therapy: Secondary | ICD-10-CM

## 2016-12-02 NOTE — Progress Notes (Signed)
OFFICE NOTE  Chief Complaint:  Chest pain, dyspnea  Primary Care Physician: Arnoldo Morale, MD  HPI:  Andrew West  is a 51 year old gentleman with a history of coronary artery disease and stent placement to the circumflex and obtuse marginal and a history of in stent restenosis to the LAD. There is also a nondominant right coronary artery that is 100% occluded, filled with collaterals. He did have a stress test recently, which showed an EF of 53% and reversible septal ischemia in the setting of chest pain and after much convincing underwent cardiac catheterization.  He then underwent cutting balloon angioplasty to an ostial lateral OM1 branch and the bifurcation AV groove circumflex OM junction. This was successful at reducing the stenosis to 0%. This was in November 2012 and he did not return for followup appointment until April 2013. He was suffering from low back pain and has been evaluated and treated by Dr. Trenton Gammon with a laminectomy/discectomy, which he says was not helpful. Otherwise, he continues to smoke and occasionally complains of shortness of breath and some chest pain symptoms which are atypical. He is on long-acting and/or has not needed to take short-acting nitroglycerin. His other concern today is that he is having problems with his teeth and was recommended to have edentulation and by an oral surgeon Dr. Diona Browner. Finally, he is suffering from significant stress and depression due to recent separation with wife in dealing with his kids. This seems to be a big tablets on his continued smoking.  Andrew West returns today in the office. He feels fairly well. He denies any chest pain. He continues to have problems with low back pain and numbness and tingling in his legs. He is apparently status post laminectomy and microdiscectomy by Dr. Trenton Gammon and continues to have symptoms which may be related to that. He's also complaining of what sounds like neuropathic pain. He is  not currently on medication. He is asking for Tylenol 3 for pain today which I told him I did not prescribe. Ultimately there are no further surgical or nonsurgical options, he may need to go on a neuropathic pain medication or perhaps be referred to a pain management specialist. He is also looking for a new primary care provider as his primary care provider is close to retirement. He reports he has cut back his smoking to about 1 pack every 2 weeks. He is also been out of amlodipine and simvastatin although his blood pressure is well controlled.  I saw Andrew West in the office today. He is reporting now complains of shortness of breath and chest pain. He also feels like his breathing is worse when working around chemicals at work. He uses a respiratory protection mask but also feels like he is under significant stress. He's had missed several days of work and is concerned about his job. Sounds like he is in a difficult work environment. He says that he is apologizing for "deceiving me" that he was not honest about his symptoms. He apparently has been having shortness of breath and chest pain for several months and did not relay that to me.  Andrew West returns to the office today for follow-up. His main complaint is shortness of breath. He's not feeling as much chest pain he's had been previously. We recently did a nuclear stress test which was negative for ischemia and showed an EF of 56%. With regards to shortness of breath he's concerned most of this was related to chemicals at work although  he has not worked in several months. He's had about 20 pound weight gain since we last saw him in the office and denies any lower extremity swelling, orthopnea or PND. Shortness of breath is persistent and is associated with some wheezing. He did see Dr. 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 Shortness of breath seems to be a little bit worse with exertion which makes me wonder whether there is an element of exercise-induced pulmonary hypertension. He's not had an echocardiogram in some time.  Andrew West returns today for follow-up. He was noted to have some diastolic dysfunction on his echocardiogram. I started him on low-dose Lasix and check lab work including a BNP which is very low around 50. On follow-up today he reports he feels no different with the addition of Lasix. I therefore asked him to take it as needed. He is also describing worsening substernal chest discomfort and a squeezing pressure in his chest today. He says this is been getting worse over the past several days, more than his typical chest discomfort. As previously noted he recently underwent a nuclear stress test a few months ago which was negative for ischemia.   Andrew West returns for follow-up. As mentioned he had had recent progressive chest pain symptoms and worsening shortness of breath. He underwent another cardiac catheterization which demonstrated the following:   Ost 2nd Mrg to 2nd Mrg lesion, 99% stenosed. Post intervention, 99% residual stenosis remained. The lesion was previously treated with a bare metal stentgreater than two years ago.  Mid Cx-2 lesion, 90% stenosed. Post intervention, there is a 20% residual stenosis.  Mid Cx-1 lesion, 80% stenosed. Post intervention, there is a 50% residual stenosis. The lesion was previously treated with a bare metal stent.  Ramus lesion, 100% stenosed. The lesion was previously treated with a bare metal stentgreater than two years ago.  Prox RCA lesion, 100% stenosed.  There is mild left ventricular systolic dysfunction.  Moderately elevated LVEDP of 20 mmHg   Difficult situation with what amounts to be a totally occluded (In-stent re-stenosis) of the OM2 branch with sub-optimal PTCA of the mid AV-Groove In-stent restenosis. He  continues to have chest pain although reports it somewhat improved. He was started on ranolazine and currently is on 1000 mg twice a day.  Andrew West returns today for follow-up. He reports he is doing fairly well on medical therapy. He still gets some sharp chest pain mostly along the right sternal border. He does get short of breath with moderate exertion. He's trying to do some walking on a treadmill but generally stops the exercise once he gets short of breath because he is very nervous. He is not currently working due to combined cardiac pulmonary and musculoskeletal diseases. He says that he and his wife are going to go on a delayed honeymoon since they never took one.  04/20/2016  Andrew West returns today for follow-up. He was recently seen in the hospital in the beginning of May for chest pain. He reports that it's up in the left neck base around the area of the left clavicle. It's very exquisitely tender to touch and feels sharp and electric. This pain is related we think to cervical pain. He ruled out for MI and was not worked up further for coronary ischemia. This pain is felt to be distinctly different. His isosorbide was  increased up to 60 mg twice a day but he notes no change in his symptoms with that. His primary care providers hesitant to provide any pain medication.  06/28/2016  Andrew West was seen back today in follow-up. He recently was in the ER at [facility] the left without being seen after 3 hours. An EKG was performed and showed no ischemic changes. He reported going home and taking some nitroglycerin and aspirin with some eventual improvement in his symptoms. He continues to have a sharp left chest discomfort which is tender to the touch and is persistent. It's not necessarily worse with exertion or relieved by rest. I felt this may likely be a neuropathic pain and I recommended starting gabapentin 300 mg daily at bedtime. He reports no improvement with this medication. I advised him to  discontinue today. He's also concerned about poor sleep at night, significant preoccupation with death and anxiety. This is concerning for possible PTSD type symptoms.  08/23/2016  Andrew West returns today for follow-up. I referred him to behavioral health and he says that he went but it was not helpful. The medical record however does not show any evidence that he made that appointment. He did present to the emergency department on 06/29/2016 with depression and anxiety and was felt not to need inpatient treatment. He underwent nuclear stress testing which was negative for ischemia and showed normal LV function. I do not believe is ongoing chest pain at this time is due to ischemia. Blood pressure is elevated today however 142/102. A recheck was 138/89.  11/03/2016  I saw Andrew West today for follow-up again. He is complaining of persistent chest pain which is chronic, tends to be present for most of the day at rest and with exertion. He also complains of shortness of breath. Surprisingly blood pressure is elevated today, much higher than it has been in the past at 150/105. He reports compliance with his medications. This could be contributing to his problems. He is interested in another heart catheterization. I did remind him that we performed a stress test only 3 months ago which was negative for ischemia. His last coronary intervention was about a year ago which she had some percutaneous angioplasty but no new stent placement. He has complex anatomy that was not amenable to PCI, rather medical therapy was recommended however he has been on maximal doses of nitrates, Ranexa, beta blocker and other blood pressure medications. Interestingly, despite a high dose of metoprolol, his heart rate remains up in the 80s. EKG today is nonischemic.  12/02/2016  Andrew West returns today for follow-up. He reports despite changing his medications which has resulted in improvement in blood pressure that he still has  significant chest discomfort and shortness of breath. He is certain that there is a new blockage in his heart that the cause of this. He believes he needs another heart catheterization. Symptoms seem similar to his symptoms prior to his last cutting balloon angioplasty more than 2 years ago.  PMHx:  Past Medical History:  Diagnosis Date  . CAD (coronary artery disease)    2002; treated with stent to mid L circumflex  . Chronic back pain   . Chronic leg pain    bilateral  . Depression   . Diabetes mellitus   . GERD (gastroesophageal reflux disease)    Takes Dexilant  . HLD (hyperlipidemia)   . Hypertension   . Rhabdomyolysis    h/o, r/t statins    Past Surgical History:  Procedure Laterality  Date  . CARDIAC CATHETERIZATION N/A 09/25/2015   Procedure: Left Heart Cath and Coronary Angiography;  Surgeon: Leonie Man, MD;  Location: [facility];  Service: Cardiovascular;  Laterality: N/A;  . COLONOSCOPY W/ POLYPECTOMY    . CORONARY ANGIOPLASTY  09/25/2015   mid cir & om  . CORONARY ANGIOPLASTY WITH STENT PLACEMENT  10/09/2001   PTCA & stenting of mid AV circumflex; 2.5x82m Pixel stent  . CORONARY ANGIOPLASTY WITH STENT PLACEMENT  12/13/2001   PCI with stent to mid L circumflex, 95% stenosis to 0% residual  . CORONARY ANGIOPLASTY WITH STENT PLACEMENT  10/10/2003   PCI to mid AV circumflex; LAD 30% disease; RCA 100% occluded prox.  . CORONARY ANGIOPLASTY WITH STENT PLACEMENT  09/01/2011   PCI with stenting with bare metal stent to mid AV groove circumflex and PDA  . CORONARY ANGIOPLASTY WITH STENT PLACEMENT  10/17/2011   cutting balloon angioplasty of ostial lateral OM1 branch and bifurcation AV groove circumflex OM junction; stenosis reduced to 0%  . LEFT HEART CATHETERIZATION WITH CORONARY ANGIOGRAM N/A 10/18/2011   Procedure: LEFT HEART CATHETERIZATION WITH CORONARY ANGIOGRAM;  Surgeon: DLeonie Man MD;  [facility];  Service: Cardiovascular;  Laterality:  N/A;  . LUMBAR LAMINECTOMY/DECOMPRESSION MICRODISCECTOMY  03/31/2012   Procedure: LUMBAR LAMINECTOMY/DECOMPRESSION MICRODISCECTOMY 1 LEVEL;  Surgeon: HCharlie Pitter MD;  [facility];  Service: Neurosurgery;  Laterality: Left;  . TRANSTHORACIC ECHOCARDIOGRAM  07/28/2011   EF 55-65%; LVH, grade 1 diastolic dysfunction;     FAMHx:  Family History  Problem Relation Age of Onset  . Heart attack Father   . Hypertension Mother   . Diabetes Mother   . Heart disease Brother     x 3   . Heart attack Brother     deceased  . Hypertension Sister   . Diabetes Sister   . Anesthesia problems Neg Hx   . Hypotension Neg Hx   . Malignant hyperthermia Neg Hx   . Pseudochol deficiency Neg Hx     SOCHx:   reports that he quit smoking about 18 months ago. His smoking use included Cigarettes. He has a 6.25 pack-year smoking history. He has never used smokeless tobacco. He reports that he does not drink alcohol or use drugs.  ALLERGIES:  Allergies  Allergen Reactions  . Iohexol     PT. TO BE PREMEDICATED PRIOR TO IV CONTRAST PER DR SKris Hartmann/MMS//12/15/15Desc: PT BECAME SOB AND CHEST TIGHTNESS AFTER CONTRAST INJECTION.  [name],RT-RCT., Onset Date: [id number]  . Vicodin [Hydrocodone-Acetaminophen] Itching and Rash    ROS: Pertinent items noted in HPI and remainder of comprehensive ROS otherwise negative.  HOME MEDS: Current Outpatient Prescriptions  Medication Sig Dispense Refill  . acetaminophen (TYLENOL) 500 MG tablet Take 1,000 mg by mouth every 6 (six) hours as needed for headache.    .Marland Kitchenacetaminophen-codeine (TYLENOL #3) 300-30 MG tablet Take 1 tablet by mouth every 12 (twelve) hours as needed for moderate pain. 50 tablet 0  . Albuterol Sulfate (PROAIR RESPICLICK) 1540(90 Base) MCG/ACT AEPB Inhale 1 puff into the lungs every 6 (six) hours as needed (shortness of breath). 1 each 2  . aspirin 81 MG tablet Take 1 tablet (81 mg total) by mouth daily.    . ciprofloxacin (CIPRO) 500 MG  tablet Take 1 tablet (500 mg total) by mouth 2 (two) times daily. 20 tablet 0  . clopidogrel (PLAVIX) 75 MG tablet Take 1 tablet (75 mg total) by mouth daily.  90 tablet 3  . FLUoxetine (PROZAC) 20 MG tablet Take 1 tablet (20 mg total) by mouth daily. 30 tablet 3  . furosemide (LASIX) 20 MG tablet Take 1 tablet (20 mg total) by mouth daily as needed for fluid or edema. 30 tablet 2  . hydrALAZINE (APRESOLINE) 50 MG tablet Take 1 tablet (50 mg total) by mouth 2 (two) times daily. 180 tablet 3  . isosorbide mononitrate (IMDUR) 30 MG 24 hr tablet Take 2 tablets (60 mg total) by mouth 2 (two) times daily. 60 tablet 3  . meloxicam (MOBIC) 7.5 MG tablet Take 1 tablet (7.5 mg total) by mouth daily. 30 tablet 0  . metFORMIN (GLUCOPHAGE) 1000 MG tablet Take 1 tablet (1,000 mg total) by mouth 2 (two) times daily with a meal. 180 tablet 1  . mometasone (ASMANEX) 220 MCG/INH inhaler Inhale 2 puffs into the lungs daily. 1 Inhaler 2  . Nebivolol HCl 20 MG TABS Take 1 tablet (20 mg total) by mouth daily. 90 tablet 3  . nitroGLYCERIN (NITROSTAT) 0.4 MG SL tablet Place 1 tablet (0.4 mg total) under the tongue every 5 (five) minutes as needed for chest pain. 25 tablet 11  . pantoprazole (PROTONIX) 40 MG tablet Take 1 tablet (40 mg total) by mouth daily. 30 tablet 11  . ranolazine (RANEXA) 1000 MG SR tablet Take 1 tablet (1,000 mg total) by mouth 2 (two) times daily. 180 tablet 3  . simvastatin (ZOCOR) 20 MG tablet Take 1 tablet by mouth daily.  3  . traZODone (DESYREL) 50 MG tablet Take 1 tablet (50 mg total) by mouth at bedtime as needed for sleep. 30 tablet 3  . valsartan-hydrochlorothiazide (DIOVAN HCT) 320-25 MG tablet Take 1 tablet by mouth daily. 90 tablet 1  . amLODipine (NORVASC) 10 MG tablet Take 1 tablet (10 mg total) by mouth daily. 30 tablet 3   No current facility-administered medications for this visit.     LABS/IMAGING: No results found for this or any previous visit (from the past 48 hour(s)). No  results found.  VITALS: BP (!) 142/91   Pulse 82   Ht '5\' 8"'$  (1.727 m)   Wt 264 lb 3.2 oz (119.8 kg)   SpO2 98%   BMI 40.17 kg/m   EXAM: Deferred  EKG: Deferred  ASSESSMENT:  1. Chest pain and dyspnea at rest and with exertion - Occluded OM2 at a prior stent - no good       revascularization options - low risk Myoview with no ischemia (07/2016) 2. Coronary artery disease status post PCI and cutting balloon angioplasty over 2 years ago 3. Ongoing tobacco abuse 4. Uncontrolled hypertension 5. Dyslipidemia 6. Neuropathy 7. Persistent low back pain 8. Diabetes type 2 9. Morbid obesity 10. Anxiety/?PTSD  PLAN: 1.   Mr. Deren continues to have progressive chest pain despite a recent low risk Myoview without any ischemia in September 2017. He does have an occluded OM 2 and a prior stent without good revascularization options. He has failed radical therapy and is on high-dose Ranexa and nitrate. Blood pressure is now better controlled however his symptoms persist. I will plan to refer him for repeat cardiac catheterization with Dr. Ellyn Hack, per his request, to determine if there are any interventional options for him.  Follow-up afterwards.  Pixie Casino, MD, [facility] Attending Cardiologist [place] 12/02/2016, 1:06 PM

## 2016-12-02 NOTE — Patient Instructions (Addendum)
Your physician has requested that you have a cardiac catheterization @ [facility] with Dr. Ellyn Hack. Cardiac catheterization is used to diagnose and/or treat various heart conditions. Doctors may recommend this procedure for a number of different reasons. The most common reason is to evaluate chest pain. Chest pain can be a symptom of coronary artery disease (CAD), and cardiac catheterization can show whether plaque is narrowing or blocking your heart's arteries. This procedure is also used to evaluate the valves, as well as measure the blood flow and oxygen levels in different parts of your heart. For further information please visit [email or web address]. Please follow instruction sheet, as given.  Following your catheterization, you will not be allowed to drive for 3 days.  No lifting, pushing, or pulling greater that 10 pounds is allowed for 1 week.  You will be required to have the following tests prior to the procedure:  1. Blood work - the blood work can be done no more than 14 days prior to the procedure.  It can be done at any [facility] lab. There is a lab downstairs on the first floor of this building in [address] and one at [place] [address].  2. Chest X-ray - this can be done at [facility] in the [place] @ [address]

## 2016-12-06 ENCOUNTER — Other Ambulatory Visit: Payer: Self-pay | Admitting: *Deleted

## 2016-12-08 ENCOUNTER — Ambulatory Visit: Payer: Self-pay | Admitting: Family Medicine

## 2016-12-10 ENCOUNTER — Other Ambulatory Visit: Payer: Self-pay | Admitting: *Deleted

## 2016-12-10 ENCOUNTER — Telehealth: Payer: Self-pay | Admitting: Internal Medicine

## 2016-12-10 DIAGNOSIS — I200 Unstable angina: Secondary | ICD-10-CM

## 2016-12-10 MED ORDER — PREDNISONE 20 MG PO TABS: ORAL_TABLET | ORAL | 0 refills | Status: DC

## 2016-12-10 NOTE — Telephone Encounter (Signed)
LM for patient that meds will be given to him in hospital as needed

## 2016-12-10 NOTE — Telephone Encounter (Signed)
They will give him anxiolytics prior to his cath in the hospital.  Thanks.  Dr. Lemmie Evens

## 2016-12-10 NOTE — Telephone Encounter (Signed)
Called patient to confirm that he was aware to HOLD metformin the morning of heart cath. He states he is aware. Notified him he will need pre-meds prior to heart cath - prednisone '60mg'$  PO evening before and morning of procedure & benadryl & pepcid at hospital (put in hospital order set). He agrees w/plan. Rx(s) sent to pharmacy electronically.   He states his nerves are "all tore up" and he wanted to know if Dr. Debara Pickett would call in something to help him relax to take the evening prior - he is aware the hospital staff can give him PRN medications for this during procedure. Advised will have to send message to MD for his request.

## 2016-12-13 ENCOUNTER — Ambulatory Visit
Admission: RE | Admit: 2016-12-13 | Discharge: 2016-12-13 | Disposition: A | Discharge status: Home / Self Care | Payer: BC Managed Care – PPO | Source: Ambulatory Visit | Attending: Internal Medicine | Admitting: Internal Medicine

## 2016-12-13 ENCOUNTER — Telehealth: Payer: Self-pay | Admitting: Internal Medicine

## 2016-12-13 DIAGNOSIS — I2510 Atherosclerotic heart disease of native coronary artery without angina pectoris: Secondary | ICD-10-CM

## 2016-12-13 DIAGNOSIS — Z9861 Coronary angioplasty status: Secondary | ICD-10-CM

## 2016-12-13 DIAGNOSIS — I10 Essential (primary) hypertension: Secondary | ICD-10-CM

## 2016-12-13 DIAGNOSIS — Z01818 Encounter for other preprocedural examination: Secondary | ICD-10-CM

## 2016-12-13 NOTE — Telephone Encounter (Signed)
New message  Pt call requesting to speak with RN. Pt states he went to the lab to get blood drawn, but was unable to do so. Pt asked to get a all back from the RN.

## 2016-12-13 NOTE — Telephone Encounter (Signed)
Returned call to patient.He stated he went to [facility] to have pre cath lab work today, but he waited over a hour to have done, he left without having done.Stated he cannot wait that long,wants to know what other lab he can have done at.Advised to go to [place] lab at our office at [place] on first floor.Directions given.

## 2016-12-14 LAB — CBC WITH DIFFERENTIAL/PLATELET
BASOS PCT: 0 %
Basophils Absolute: 0 cells/uL (ref 0–200)
EOS ABS: 75 {cells}/uL (ref 15–500)
Eosinophils Relative: 1 %
HCT: 42.8 % (ref 38.5–50.0)
Hemoglobin: 14 g/dL (ref 13.2–17.1)
Lymphocytes Relative: 45 %
Lymphs Abs: 3375 cells/uL (ref 850–3900)
MCH: 26.5 pg — ABNORMAL LOW (ref 27.0–33.0)
MCHC: 32.7 g/dL (ref 32.0–36.0)
MCV: 81.1 fL (ref 80.0–100.0)
MONO ABS: 750 {cells}/uL (ref 200–950)
MONOS PCT: 10 %
MPV: 9.7 fL (ref 7.5–12.5)
NEUTROS ABS: 3300 {cells}/uL (ref 1500–7800)
Neutrophils Relative %: 44 %
PLATELETS: 250 10*3/uL (ref 140–400)
RBC: 5.28 MIL/uL (ref 4.20–5.80)
RDW: 14 % (ref 11.0–15.0)
WBC: 7.5 10*3/uL (ref 3.8–10.8)

## 2016-12-14 LAB — BASIC METABOLIC PANEL
BUN: 10 mg/dL (ref 7–25)
CALCIUM: 9.2 mg/dL (ref 8.6–10.3)
CO2: 27 mmol/L (ref 20–31)
CREATININE: 0.96 mg/dL (ref 0.70–1.33)
Chloride: 104 mmol/L (ref 98–110)
Glucose, Bld: 95 mg/dL (ref 65–99)
Potassium: 4.4 mmol/L (ref 3.5–5.3)
Sodium: 140 mmol/L (ref 135–146)

## 2016-12-15 ENCOUNTER — Ambulatory Visit: Payer: BC Managed Care – PPO | Attending: Family Medicine | Admitting: Family Medicine

## 2016-12-15 ENCOUNTER — Encounter: Payer: Self-pay | Admitting: Family Medicine

## 2016-12-15 VITALS — BP 115/77 | HR 70 | Temp 98.1°F | Ht 68.0 in | Wt 257.2 lb

## 2016-12-15 DIAGNOSIS — R310 Gross hematuria: Secondary | ICD-10-CM | POA: Diagnosis not present

## 2016-12-15 DIAGNOSIS — K219 Gastro-esophageal reflux disease without esophagitis: Secondary | ICD-10-CM | POA: Insufficient documentation

## 2016-12-15 DIAGNOSIS — Z7982 Long term (current) use of aspirin: Secondary | ICD-10-CM | POA: Diagnosis not present

## 2016-12-15 DIAGNOSIS — E0800 Diabetes mellitus due to underlying condition with hyperosmolarity without nonketotic hyperglycemic-hyperosmolar coma (NKHHC): Secondary | ICD-10-CM

## 2016-12-15 DIAGNOSIS — Z5189 Encounter for other specified aftercare: Secondary | ICD-10-CM | POA: Insufficient documentation

## 2016-12-15 DIAGNOSIS — I2510 Atherosclerotic heart disease of native coronary artery without angina pectoris: Secondary | ICD-10-CM | POA: Diagnosis not present

## 2016-12-15 DIAGNOSIS — F331 Major depressive disorder, recurrent, moderate: Secondary | ICD-10-CM | POA: Diagnosis not present

## 2016-12-15 DIAGNOSIS — E870 Hyperosmolality and hypernatremia: Secondary | ICD-10-CM | POA: Insufficient documentation

## 2016-12-15 DIAGNOSIS — I10 Essential (primary) hypertension: Secondary | ICD-10-CM | POA: Diagnosis not present

## 2016-12-15 DIAGNOSIS — Z9889 Other specified postprocedural states: Secondary | ICD-10-CM | POA: Diagnosis not present

## 2016-12-15 DIAGNOSIS — E119 Type 2 diabetes mellitus without complications: Secondary | ICD-10-CM | POA: Insufficient documentation

## 2016-12-15 DIAGNOSIS — G4700 Insomnia, unspecified: Secondary | ICD-10-CM | POA: Diagnosis not present

## 2016-12-15 DIAGNOSIS — M5116 Intervertebral disc disorders with radiculopathy, lumbar region: Secondary | ICD-10-CM | POA: Insufficient documentation

## 2016-12-15 DIAGNOSIS — Z955 Presence of coronary angioplasty implant and graft: Secondary | ICD-10-CM

## 2016-12-15 DIAGNOSIS — Z79899 Other long term (current) drug therapy: Secondary | ICD-10-CM | POA: Diagnosis not present

## 2016-12-15 DIAGNOSIS — J449 Chronic obstructive pulmonary disease, unspecified: Secondary | ICD-10-CM | POA: Insufficient documentation

## 2016-12-15 DIAGNOSIS — G4709 Other insomnia: Secondary | ICD-10-CM

## 2016-12-15 DIAGNOSIS — Z7984 Long term (current) use of oral hypoglycemic drugs: Secondary | ICD-10-CM | POA: Diagnosis not present

## 2016-12-15 LAB — POCT URINALYSIS DIPSTICK
Glucose, UA: NEGATIVE
LEUKOCYTES UA: NEGATIVE
NITRITE UA: NEGATIVE
PH UA: 6
RBC UA: NEGATIVE
Spec Grav, UA: 1.025
Urobilinogen, UA: 1

## 2016-12-15 LAB — PROTIME-INR
INR: 1
Prothrombin Time: 10.9 s (ref 9.0–11.5)

## 2016-12-15 LAB — GLUCOSE, POCT (MANUAL RESULT ENTRY): POC Glucose: 108 mg/dl — AB (ref 70–99)

## 2016-12-15 MED ORDER — ATORVASTATIN CALCIUM 40 MG PO TABS: 40.0000 mg | ORAL_TABLET | Freq: Every day | ORAL | 1 refills | Status: DC

## 2016-12-15 MED ORDER — VALSARTAN-HYDROCHLOROTHIAZIDE 320-25 MG PO TABS: 1.0000 | ORAL_TABLET | Freq: Every day | ORAL | 1 refills | Status: DC

## 2016-12-15 MED ORDER — ACETAMINOPHEN-CODEINE #3 300-30 MG PO TABS: 1.0000 | ORAL_TABLET | Freq: Two times a day (BID) | ORAL | 0 refills | Status: DC | PRN

## 2016-12-15 MED ORDER — CLOPIDOGREL BISULFATE 75 MG PO TABS
75.0000 mg | ORAL_TABLET | Freq: Every day | ORAL | 1 refills | Status: DC
Start: 2016-12-15 — End: 2017-04-08

## 2016-12-15 MED ORDER — FLUOXETINE HCL 20 MG PO TABS: 20.0000 mg | ORAL_TABLET | Freq: Every day | ORAL | 1 refills | Status: DC

## 2016-12-15 MED ORDER — MOMETASONE FUROATE 220 MCG/INH IN AEPB: 2.0000 | INHALATION_SPRAY | Freq: Every day | RESPIRATORY_TRACT | 1 refills | Status: DC

## 2016-12-15 MED ORDER — AMLODIPINE BESYLATE 10 MG PO TABS: 10.0000 mg | ORAL_TABLET | Freq: Every day | ORAL | 1 refills | Status: DC

## 2016-12-15 MED ORDER — TRAZODONE HCL 50 MG PO TABS: 50.0000 mg | ORAL_TABLET | Freq: Every evening | ORAL | 1 refills | Status: DC | PRN

## 2016-12-15 MED ORDER — PANTOPRAZOLE SODIUM 40 MG PO TBEC: 40.0000 mg | DELAYED_RELEASE_TABLET | Freq: Every day | ORAL | 1 refills | Status: DC

## 2016-12-15 MED ORDER — ALBUTEROL SULFATE 108 (90 BASE) MCG/ACT IN AEPB: 1.0000 | INHALATION_SPRAY | Freq: Four times a day (QID) | RESPIRATORY_TRACT | 1 refills | Status: DC | PRN

## 2016-12-15 MED ORDER — METFORMIN HCL 1000 MG PO TABS: 1000.0000 mg | ORAL_TABLET | Freq: Two times a day (BID) | ORAL | 1 refills | Status: DC

## 2016-12-15 MED ORDER — CLOPIDOGREL BISULFATE 75 MG PO TABS: 75.0000 mg | ORAL_TABLET | Freq: Every day | ORAL | 1 refills | Status: DC

## 2016-12-15 MED FILL — FLUoxetine HCL 20 MG CAPS: 20 | 30 days supply | Qty: 30 | Fill #0

## 2016-12-15 NOTE — Progress Notes (Signed)
Subjective:  Patient ID: Andrew West, male    DOB: 02-08-1966  Age: 51 y.o. MRN: [id number]  CC: Diabetes; Hypertension; and Hematuria (follow up)   HPI Andrew West is a 51 y.o. with a  Medical history of Type 2 DM (A1c 6.0), CAD s/p PCI/stent to LCx and OM1In 09/2015; cutting balloon angioplasty to OM 1, HLD, HTN, continuous tobacco abuse, chronic back pain s/p laminectomy/discectomy (3 years ago by Dr Dutch Quint), depression who comes into the clinic for follow-up visit.  He has chronic low back pain which reduced the right leg and remains on Tylenol 3 and gabapentin for this (gabapentin does not appear in his med list however he states he receives this from an outside physician) He has been unable to see pain management due to multiple appointments with several doctors.  Reports improvement in depression symptoms and insomnia since commencing Prozac and trazodone; he states he feels better overall. He was seen for hematuria his last office visit and reports no repeat episodes of hematuria since then and denies any other urinary symptoms.  He is scheduled for cardiac cath tomorrow morning and is requesting refills of all his medications.  Past Medical History:  Diagnosis Date  . CAD (coronary artery disease)    2002; treated with stent to mid L circumflex  . Chronic back pain   . Chronic leg pain    bilateral  . Depression   . Diabetes mellitus   . GERD (gastroesophageal reflux disease)    Takes Dexilant  . HLD (hyperlipidemia)   . Hypertension   . Rhabdomyolysis    h/o, r/t statins    Past Surgical History:  Procedure Laterality Date  . CARDIAC CATHETERIZATION N/A 09/25/2015   Procedure: Left Heart Cath and Coronary Angiography;  Surgeon: Marykay Lex, MD;  Location: [facility] INVASIVE CV LAB;  Service: Cardiovascular;  Laterality: N/A;  . COLONOSCOPY W/ POLYPECTOMY    . CORONARY ANGIOPLASTY  09/25/2015   mid cir & om  . CORONARY ANGIOPLASTY WITH STENT PLACEMENT   10/09/2001   PTCA & stenting of mid AV circumflex; 2.5x24mm Pixel stent  . CORONARY ANGIOPLASTY WITH STENT PLACEMENT  12/13/2001   PCI with stent to mid L circumflex, 95% stenosis to 0% residual  . CORONARY ANGIOPLASTY WITH STENT PLACEMENT  10/10/2003   PCI to mid AV circumflex; LAD 30% disease; RCA 100% occluded prox.  . CORONARY ANGIOPLASTY WITH STENT PLACEMENT  09/01/2011   PCI with stenting with bare metal stent to mid AV groove circumflex and PDA  . CORONARY ANGIOPLASTY WITH STENT PLACEMENT  10/17/2011   cutting balloon angioplasty of ostial lateral OM1 branch and bifurcation AV groove circumflex OM junction; stenosis reduced to 0%  . LEFT HEART CATHETERIZATION WITH CORONARY ANGIOGRAM N/A 10/18/2011   Procedure: LEFT HEART CATHETERIZATION WITH CORONARY ANGIOGRAM;  Surgeon: Marykay Lex, MD;  Location: [facility] CATH LAB;  Service: Cardiovascular;  Laterality: N/A;  . LUMBAR LAMINECTOMY/DECOMPRESSION MICRODISCECTOMY  03/31/2012   Procedure: LUMBAR LAMINECTOMY/DECOMPRESSION MICRODISCECTOMY 1 LEVEL;  Surgeon: Temple Pacini, MD;  Location: [facility] NEURO ORS;  Service: Neurosurgery;  Laterality: Left;  . TRANSTHORACIC ECHOCARDIOGRAM  07/28/2011   EF 55-65%; LVH, grade 1 diastolic dysfunction;     Allergies  Allergen Reactions  . Iohexol     PT. TO BE PREMEDICATED PRIOR TO IV CONTRAST PER DR Eppie Gibson /MMS//12/15/15Desc: PT BECAME SOB AND CHEST TIGHTNESS AFTER CONTRAST INJECTION.  [name],RT-RCT., Onset Date: [id number]   . Vicodin [Hydrocodone-Acetaminophen] Itching and Rash  Pt tolerates ok     Outpatient Medications Prior to Visit  Medication Sig Dispense Refill  . acetaminophen (TYLENOL) 500 MG tablet Take 1,000 mg by mouth every 6 (six) hours as needed for headache.    Marland Kitchen aspirin 81 MG tablet Take 1 tablet (81 mg total) by mouth daily.    . furosemide (LASIX) 20 MG tablet Take 1 tablet (20 mg total) by mouth daily as needed for fluid or edema. (Patient taking differently: Take 20 mg by mouth  2 (two) times daily after a meal. ) 30 tablet 2  . hydrALAZINE (APRESOLINE) 50 MG tablet Take 1 tablet (50 mg total) by mouth 2 (two) times daily. 180 tablet 3  . isosorbide mononitrate (IMDUR) 30 MG 24 hr tablet Take 2 tablets (60 mg total) by mouth 2 (two) times daily. 60 tablet 3  . Nebivolol HCl 20 MG TABS Take 1 tablet (20 mg total) by mouth daily. 90 tablet 3  . nitroGLYCERIN (NITROSTAT) 0.4 MG SL tablet Place 1 tablet (0.4 mg total) under the tongue every 5 (five) minutes as needed for chest pain. 25 tablet 11  . predniSONE (DELTASONE) 20 MG tablet Take 3 tablets (60mg ) by mouth the evening before and morning of your procedure. 6 tablet 0  . ranolazine (RANEXA) 1000 MG SR tablet Take 1 tablet (1,000 mg total) by mouth 2 (two) times daily. 180 tablet 3  . acetaminophen-codeine (TYLENOL #3) 300-30 MG tablet Take 1 tablet by mouth every 12 (twelve) hours as needed for moderate pain. 50 tablet 0  . Albuterol Sulfate (PROAIR RESPICLICK) 108 (90 Base) MCG/ACT AEPB Inhale 1 puff into the lungs every 6 (six) hours as needed (shortness of breath). 1 each 2  . clopidogrel (PLAVIX) 75 MG tablet Take 1 tablet (75 mg total) by mouth daily. 90 tablet 3  . FLUoxetine (PROZAC) 20 MG tablet Take 1 tablet (20 mg total) by mouth daily. 30 tablet 3  . meloxicam (MOBIC) 7.5 MG tablet Take 1 tablet (7.5 mg total) by mouth daily. 30 tablet 0  . metFORMIN (GLUCOPHAGE) 1000 MG tablet Take 1 tablet (1,000 mg total) by mouth 2 (two) times daily with a meal. 180 tablet 1  . mometasone (ASMANEX) 220 MCG/INH inhaler Inhale 2 puffs into the lungs daily. 1 Inhaler 2  . pantoprazole (PROTONIX) 40 MG tablet Take 1 tablet (40 mg total) by mouth daily. 30 tablet 11  . simvastatin (ZOCOR) 20 MG tablet Take 20 mg by mouth daily.   3  . traMADol (ULTRAM) 50 MG tablet Take 50 mg by mouth 2 (two) times daily as needed for pain.    . traZODone (DESYREL) 50 MG tablet Take 1 tablet (50 mg total) by mouth at bedtime as needed for sleep.  30 tablet 3  . valsartan-hydrochlorothiazide (DIOVAN HCT) 320-25 MG tablet Take 1 tablet by mouth daily. 90 tablet 1  . amLODipine (NORVASC) 10 MG tablet Take 1 tablet (10 mg total) by mouth daily. (Patient not taking: Reported on 12/15/2016) 30 tablet 3  . ciprofloxacin (CIPRO) 500 MG tablet Take 1 tablet (500 mg total) by mouth 2 (two) times daily. (Patient not taking: Reported on 12/14/2016) 20 tablet 0   No facility-administered medications prior to visit.     ROS Review of Systems Constitutional: Negative for activity change and appetite change.  HENT: Negative for sinus pressure and sore throat.   Eyes: Negative for visual disturbance.  Respiratory: Negative for cough, chest tightness and shortness of breath.   Cardiovascular: Negative  for chest pain and leg swelling.  Gastrointestinal: Negative for abdominal pain, diarrhea, constipation and abdominal distention.  Endocrine: Negative.   Genitourinary: Negative for dysuria.  Musculoskeletal: Positive for back pain. Negative for myalgias and joint swelling.  Skin: Negative for rash.  Allergic/Immunologic: Negative.   Neurological: Positive for weakness and numbness. Negative for light-headedness.  Psychiatric/Behavioral: Positive for suicidal ideation and intent  Objective:  BP 115/77 (BP Location: Right Arm, Patient Position: Sitting, Cuff Size: Large)   Pulse 70   Temp 98.1 F (36.7 C) (Oral)   Ht 5\' 8"  (1.727 m)   Wt 257 lb 3.2 oz (116.7 kg)   SpO2 97%   BMI 39.11 kg/m   BP/Weight 12/15/2016 12/02/2016 11/19/2016  Systolic BP 115 142 110  Diastolic BP 77 91 73  Wt. (Lbs) 257.2 264.2 259.4  BMI 39.11 40.17 38.87      Physical Exam Constitutional: He is oriented to person, place, and time. He appears well-developed and well-nourished.  Cardiovascular: Normal rate, normal heart sounds and intact distal pulses.   No murmur heard. Pulmonary/Chest: Effort normal and breath sounds normal. He has no wheezes. He has no  rales. He exhibits no tenderness.  Abdominal: Soft. Bowel sounds are normal. He exhibits no distension and no mass. There is no tenderness.  Musculoskeletal: He exhibits tenderness (tenderness on palpation of the lumbar spine; positive straight leg raise bilaterally   ).  Reduced range of motion of lumbar spine.  Neurological: He is alert and oriented to person, place, and time.  Psych: normal   Lab Results  Component Value Date   HGBA1C 6.0 09/20/2016    CMP Latest Ref Rng & Units 12/13/2016 09/21/2016 09/15/2016  Glucose 65 - 99 mg/dL 95 91 90  BUN 7 - 25 mg/dL 10 10 7   Creatinine 0.70 - 1.33 mg/dL 0.53 9.76 7.34  Sodium 135 - 146 mmol/L 140 139 141  Potassium 3.5 - 5.3 mmol/L 4.4 4.0 3.5  Chloride 98 - 110 mmol/L 104 101 106  CO2 20 - 31 mmol/L 27 27 25   Calcium 8.6 - 10.3 mg/dL 9.2 9.2 1.9(F)  Total Protein 6.1 - 8.1 g/dL - 7.1 -  Total Bilirubin 0.2 - 1.2 mg/dL - 0.5 -  Alkaline Phos 40 - 115 U/L - 55 -  AST 10 - 35 U/L - 15 -  ALT 9 - 46 U/L - 18 -    Lipid Panel     Component Value Date/Time   CHOL 129 09/21/2016 0900   TRIG 155 (H) 09/21/2016 0900   HDL 25 (L) 09/21/2016 0900   CHOLHDL 5.2 (H) 09/21/2016 0900   VLDL 31 (H) 09/21/2016 0900   LDLCALC 73 09/21/2016 0900    Assessment & Plan:   1. Diabetes mellitus due to underlying condition with hyperosmolarity without coma, without long-term current use of insulin (HCC) Controlled with A1c of 6.0 - Glucose (CBG) - metFORMIN (GLUCOPHAGE) 1000 MG tablet; Take 1 tablet (1,000 mg total) by mouth 2 (two) times daily with a meal.  Dispense: 180 tablet; Refill: 1  2. COPD with asthma (HCC) Stable - mometasone (ASMANEX) 220 MCG/INH inhaler; Inhale 2 puffs into the lungs daily.  Dispense: 3 Inhaler; Refill: 1 - Albuterol Sulfate (PROAIR RESPICLICK) 108 (90 Base) MCG/ACT AEPB; Inhale 1 puff into the lungs every 6 (six) hours as needed (shortness of breath).  Dispense: 3 each; Refill: 1  3. Essential  hypertension Controlled Low-sodium diet - valsartan-hydrochlorothiazide (DIOVAN HCT) 320-25 MG tablet; Take 1 tablet by mouth daily.  Dispense: 90 tablet; Refill: 1 - amLODipine (NORVASC) 10 MG tablet; Take 1 tablet (10 mg total) by mouth daily.  Dispense: 90 tablet; Refill: 1  4. Gastroesophageal reflux disease without esophagitis Controlled - pantoprazole (PROTONIX) 40 MG tablet; Take 1 tablet (40 mg total) by mouth daily.  Dispense: 90 tablet; Refill: 1  5. Moderate episode of recurrent major depressive disorder (HCC) Controlled - FLUoxetine (PROZAC) 20 MG tablet; Take 1 tablet (20 mg total) by mouth daily.  Dispense: 90 tablet; Refill: 1  6. Other insomnia Controlled - traZODone (DESYREL) 50 MG tablet; Take 1 tablet (50 mg total) by mouth at bedtime as needed for sleep.  Dispense: 90 tablet; Refill: 1  7. Gross hematuria Resolved - Urinalysis Dipstick  8. Presence of stent in left circumflex coronary artery Scheduled for cardiac cath in a.m - clopidogrel (PLAVIX) 75 MG tablet; Take 1 tablet (75 mg total) by mouth daily.  Dispense: 90 tablet; Refill: 1 - atorvastatin (LIPITOR) 40 MG tablet; Take 1 tablet (40 mg total) by mouth daily.  Dispense: 90 tablet; Refill: 1  9. Lumbar disc herniation with radiculopathy Patient will schedule an appointment with pain management after cardiac procedure. - acetaminophen-codeine (TYLENOL #3) 300-30 MG tablet; Take 1 tablet by mouth every 12 (twelve) hours as needed for moderate pain.  Dispense: 60 tablet; Refill: 0   Meds ordered this encounter  Medications  . DISCONTD: mometasone (ASMANEX) 220 MCG/INH inhaler    Sig: Inhale 2 puffs into the lungs daily.    Dispense:  3 Inhaler    Refill:  1  . DISCONTD: Albuterol Sulfate (PROAIR RESPICLICK) 108 (90 Base) MCG/ACT AEPB    Sig: Inhale 1 puff into the lungs every 6 (six) hours as needed (shortness of breath).    Dispense:  3 each    Refill:  1  . DISCONTD: metFORMIN (GLUCOPHAGE) 1000 MG  tablet    Sig: Take 1 tablet (1,000 mg total) by mouth 2 (two) times daily with a meal.    Dispense:  180 tablet    Refill:  1  . DISCONTD: valsartan-hydrochlorothiazide (DIOVAN HCT) 320-25 MG tablet    Sig: Take 1 tablet by mouth daily.    Dispense:  90 tablet    Refill:  1  . DISCONTD: pantoprazole (PROTONIX) 40 MG tablet    Sig: Take 1 tablet (40 mg total) by mouth daily.    Dispense:  90 tablet    Refill:  1  . DISCONTD: FLUoxetine (PROZAC) 20 MG tablet    Sig: Take 1 tablet (20 mg total) by mouth daily.    Dispense:  90 tablet    Refill:  1  . DISCONTD: traZODone (DESYREL) 50 MG tablet    Sig: Take 1 tablet (50 mg total) by mouth at bedtime as needed for sleep.    Dispense:  90 tablet    Refill:  1  . DISCONTD: clopidogrel (PLAVIX) 75 MG tablet    Sig: Take 1 tablet (75 mg total) by mouth daily.    Dispense:  90 tablet    Refill:  1  . acetaminophen-codeine (TYLENOL #3) 300-30 MG tablet    Sig: Take 1 tablet by mouth every 12 (twelve) hours as needed for moderate pain.    Dispense:  60 tablet    Refill:  0  . DISCONTD: amLODipine (NORVASC) 10 MG tablet    Sig: Take 1 tablet (10 mg total) by mouth daily.    Dispense:  90 tablet    Refill:  1  .  DISCONTD: atorvastatin (LIPITOR) 40 MG tablet    Sig: Take 1 tablet (40 mg total) by mouth daily.    Dispense:  90 tablet    Refill:  1    Discontinue simvastatin  . mometasone (ASMANEX) 220 MCG/INH inhaler    Sig: Inhale 2 puffs into the lungs daily.    Dispense:  3 Inhaler    Refill:  1  . Albuterol Sulfate (PROAIR RESPICLICK) 108 (90 Base) MCG/ACT AEPB    Sig: Inhale 1 puff into the lungs every 6 (six) hours as needed (shortness of breath).    Dispense:  3 each    Refill:  1  . metFORMIN (GLUCOPHAGE) 1000 MG tablet    Sig: Take 1 tablet (1,000 mg total) by mouth 2 (two) times daily with a meal.    Dispense:  180 tablet    Refill:  1  . valsartan-hydrochlorothiazide (DIOVAN HCT) 320-25 MG tablet    Sig: Take 1 tablet by  mouth daily.    Dispense:  90 tablet    Refill:  1  . amLODipine (NORVASC) 10 MG tablet    Sig: Take 1 tablet (10 mg total) by mouth daily.    Dispense:  90 tablet    Refill:  1  . pantoprazole (PROTONIX) 40 MG tablet    Sig: Take 1 tablet (40 mg total) by mouth daily.    Dispense:  90 tablet    Refill:  1  . FLUoxetine (PROZAC) 20 MG tablet    Sig: Take 1 tablet (20 mg total) by mouth daily.    Dispense:  90 tablet    Refill:  1  . traZODone (DESYREL) 50 MG tablet    Sig: Take 1 tablet (50 mg total) by mouth at bedtime as needed for sleep.    Dispense:  90 tablet    Refill:  1  . clopidogrel (PLAVIX) 75 MG tablet    Sig: Take 1 tablet (75 mg total) by mouth daily.    Dispense:  90 tablet    Refill:  1  . atorvastatin (LIPITOR) 40 MG tablet    Sig: Take 1 tablet (40 mg total) by mouth daily.    Dispense:  90 tablet    Refill:  1    Discontinue simvastatin    Follow-up: Return in about 3 months (around 03/15/2017) for Follow-up on chronic medical conditions.   Jaclyn Shaggy MD

## 2016-12-15 NOTE — Progress Notes (Signed)
Refill- everything.  Would like a 60 day supply Having a cardiac cath tomorrow.

## 2016-12-16 ENCOUNTER — Encounter (HOSPITAL_COMMUNITY): Payer: Self-pay | Admitting: Cardiology

## 2016-12-16 ENCOUNTER — Ambulatory Visit (HOSPITAL_COMMUNITY)
Admission: RE | Admit: 2016-12-16 | Discharge: 2016-12-17 | Disposition: A | Discharge status: Home / Self Care | Payer: BC Managed Care – PPO | Source: Ambulatory Visit | Attending: Cardiology | Admitting: Cardiology

## 2016-12-16 ENCOUNTER — Encounter (HOSPITAL_COMMUNITY)
Admission: RE | Disposition: A | Discharge status: Home / Self Care | Payer: Self-pay | Source: Ambulatory Visit | Attending: Cardiology

## 2016-12-16 DIAGNOSIS — Z885 Allergy status to narcotic agent status: Secondary | ICD-10-CM | POA: Diagnosis not present

## 2016-12-16 DIAGNOSIS — Z9861 Coronary angioplasty status: Secondary | ICD-10-CM

## 2016-12-16 DIAGNOSIS — Z7902 Long term (current) use of antithrombotics/antiplatelets: Secondary | ICD-10-CM | POA: Insufficient documentation

## 2016-12-16 DIAGNOSIS — Z6841 Body Mass Index (BMI) 40.0 and over, adult: Secondary | ICD-10-CM | POA: Insufficient documentation

## 2016-12-16 DIAGNOSIS — Z7982 Long term (current) use of aspirin: Secondary | ICD-10-CM | POA: Insufficient documentation

## 2016-12-16 DIAGNOSIS — F329 Major depressive disorder, single episode, unspecified: Secondary | ICD-10-CM | POA: Diagnosis not present

## 2016-12-16 DIAGNOSIS — E1140 Type 2 diabetes mellitus with diabetic neuropathy, unspecified: Secondary | ICD-10-CM | POA: Insufficient documentation

## 2016-12-16 DIAGNOSIS — J449 Chronic obstructive pulmonary disease, unspecified: Secondary | ICD-10-CM | POA: Insufficient documentation

## 2016-12-16 DIAGNOSIS — E785 Hyperlipidemia, unspecified: Secondary | ICD-10-CM | POA: Diagnosis not present

## 2016-12-16 DIAGNOSIS — E669 Obesity, unspecified: Secondary | ICD-10-CM | POA: Diagnosis present

## 2016-12-16 DIAGNOSIS — Y840 Cardiac catheterization as the cause of abnormal reaction of the patient, or of later complication, without mention of misadventure at the time of the procedure: Secondary | ICD-10-CM | POA: Insufficient documentation

## 2016-12-16 DIAGNOSIS — M5412 Radiculopathy, cervical region: Secondary | ICD-10-CM | POA: Diagnosis not present

## 2016-12-16 DIAGNOSIS — Z87891 Personal history of nicotine dependence: Secondary | ICD-10-CM | POA: Diagnosis not present

## 2016-12-16 DIAGNOSIS — I200 Unstable angina: Secondary | ICD-10-CM | POA: Diagnosis present

## 2016-12-16 DIAGNOSIS — Z833 Family history of diabetes mellitus: Secondary | ICD-10-CM | POA: Insufficient documentation

## 2016-12-16 DIAGNOSIS — E119 Type 2 diabetes mellitus without complications: Secondary | ICD-10-CM

## 2016-12-16 DIAGNOSIS — J4489 Other specified chronic obstructive pulmonary disease: Secondary | ICD-10-CM | POA: Diagnosis present

## 2016-12-16 DIAGNOSIS — M5010 Cervical disc disorder with radiculopathy, unspecified cervical region: Secondary | ICD-10-CM | POA: Diagnosis present

## 2016-12-16 DIAGNOSIS — I2510 Atherosclerotic heart disease of native coronary artery without angina pectoris: Secondary | ICD-10-CM

## 2016-12-16 DIAGNOSIS — G4709 Other insomnia: Secondary | ICD-10-CM

## 2016-12-16 DIAGNOSIS — M545 Low back pain: Secondary | ICD-10-CM | POA: Diagnosis not present

## 2016-12-16 DIAGNOSIS — G8929 Other chronic pain: Secondary | ICD-10-CM | POA: Diagnosis not present

## 2016-12-16 DIAGNOSIS — I25119 Atherosclerotic heart disease of native coronary artery with unspecified angina pectoris: Secondary | ICD-10-CM | POA: Diagnosis present

## 2016-12-16 DIAGNOSIS — F419 Anxiety disorder, unspecified: Secondary | ICD-10-CM | POA: Diagnosis not present

## 2016-12-16 DIAGNOSIS — Z7984 Long term (current) use of oral hypoglycemic drugs: Secondary | ICD-10-CM | POA: Insufficient documentation

## 2016-12-16 DIAGNOSIS — I10 Essential (primary) hypertension: Secondary | ICD-10-CM | POA: Diagnosis present

## 2016-12-16 DIAGNOSIS — T82855A Stenosis of coronary artery stent, initial encounter: Secondary | ICD-10-CM | POA: Diagnosis not present

## 2016-12-16 DIAGNOSIS — Y712 Prosthetic and other implants, materials and accessory cardiovascular devices associated with adverse incidents: Secondary | ICD-10-CM | POA: Insufficient documentation

## 2016-12-16 DIAGNOSIS — Z8249 Family history of ischemic heart disease and other diseases of the circulatory system: Secondary | ICD-10-CM | POA: Insufficient documentation

## 2016-12-16 DIAGNOSIS — I25118 Atherosclerotic heart disease of native coronary artery with other forms of angina pectoris: Secondary | ICD-10-CM | POA: Diagnosis present

## 2016-12-16 DIAGNOSIS — F17200 Nicotine dependence, unspecified, uncomplicated: Secondary | ICD-10-CM | POA: Diagnosis present

## 2016-12-16 DIAGNOSIS — I2582 Chronic total occlusion of coronary artery: Secondary | ICD-10-CM | POA: Diagnosis not present

## 2016-12-16 DIAGNOSIS — I97630 Postprocedural hematoma of a circulatory system organ or structure following a cardiac catheterization: Secondary | ICD-10-CM | POA: Diagnosis not present

## 2016-12-16 DIAGNOSIS — I25110 Atherosclerotic heart disease of native coronary artery with unstable angina pectoris: Secondary | ICD-10-CM | POA: Insufficient documentation

## 2016-12-16 DIAGNOSIS — K219 Gastro-esophageal reflux disease without esophagitis: Secondary | ICD-10-CM | POA: Diagnosis not present

## 2016-12-16 HISTORY — DX: Other chronic pain: G89.29

## 2016-12-16 HISTORY — DX: Low back pain, unspecified: M54.50

## 2016-12-16 HISTORY — DX: Chronic obstructive pulmonary disease, unspecified: J44.9

## 2016-12-16 HISTORY — DX: Low back pain: M54.5

## 2016-12-16 HISTORY — DX: Sleep apnea, unspecified: G47.30

## 2016-12-16 HISTORY — PX: CARDIAC CATHETERIZATION: SHX172

## 2016-12-16 HISTORY — DX: Type 2 diabetes mellitus without complications: E11.9

## 2016-12-16 LAB — GLUCOSE, CAPILLARY
GLUCOSE-CAPILLARY: 123 mg/dL — AB (ref 65–99)
GLUCOSE-CAPILLARY: 145 mg/dL — AB (ref 65–99)
GLUCOSE-CAPILLARY: 149 mg/dL — AB (ref 65–99)
Glucose-Capillary: 143 mg/dL — ABNORMAL HIGH (ref 65–99)
Glucose-Capillary: 184 mg/dL — ABNORMAL HIGH (ref 65–99)

## 2016-12-16 LAB — POCT ACTIVATED CLOTTING TIME
ACTIVATED CLOTTING TIME: 389 s
Activated Clotting Time: 301 seconds
Activated Clotting Time: 241 seconds

## 2016-12-16 SURGERY — LEFT HEART CATH AND CORONARY ANGIOGRAPHY

## 2016-12-16 MED ORDER — METFORMIN HCL 500 MG PO TABS: 1000.0000 mg | ORAL_TABLET | Freq: Two times a day (BID) | ORAL | Status: DC

## 2016-12-16 MED ORDER — OXYCODONE-ACETAMINOPHEN 5-325 MG PO TABS
1.0000 | ORAL_TABLET | Freq: Four times a day (QID) | ORAL | Status: DC | PRN
  Administered 2016-12-16 (×2): 1 via ORAL
  Administered 2016-12-17: 06:00:00 2 via ORAL
  Filled 2016-12-16: qty 1
  Filled 2016-12-16: qty 2
  Filled 2016-12-16 (×3): qty 1

## 2016-12-16 MED ORDER — SODIUM CHLORIDE 0.9 % IV SOLN
INTRAVENOUS | Status: AC
  Administered 2016-12-16: 15:00:00 via INTRAVENOUS

## 2016-12-16 MED ORDER — NITROGLYCERIN 1 MG/10 ML FOR IR/CATH LAB
INTRA_ARTERIAL | Status: DC | PRN
  Administered 2016-12-16 (×3): 200 ug via INTRACORONARY

## 2016-12-16 MED ORDER — ALBUTEROL SULFATE (2.5 MG/3ML) 0.083% IN NEBU: 2.5000 mg | INHALATION_SOLUTION | Freq: Four times a day (QID) | RESPIRATORY_TRACT | Status: DC | PRN

## 2016-12-16 MED ORDER — ASPIRIN 81 MG PO CHEW
CHEWABLE_TABLET | ORAL | Status: AC
  Administered 2016-12-16: 81 mg via ORAL
  Filled 2016-12-16: qty 1

## 2016-12-16 MED ORDER — IOPAMIDOL (ISOVUE-370) INJECTION 76%
INTRAVENOUS | Status: AC
  Filled 2016-12-16: qty 100

## 2016-12-16 MED ORDER — NITROGLYCERIN 0.4 MG SL SUBL: 0.4000 mg | SUBLINGUAL_TABLET | SUBLINGUAL | Status: DC | PRN

## 2016-12-16 MED ORDER — CLOPIDOGREL BISULFATE 75 MG PO TABS
ORAL_TABLET | ORAL | Status: AC
  Filled 2016-12-16: qty 1

## 2016-12-16 MED ORDER — HEPARIN SODIUM (PORCINE) 1000 UNIT/ML IJ SOLN
INTRAMUSCULAR | Status: AC
  Filled 2016-12-16: qty 1

## 2016-12-16 MED ORDER — ANGIOPLASTY BOOK
Freq: Once | Status: AC
  Administered 2016-12-16: 23:00:00
  Filled 2016-12-16: qty 1

## 2016-12-16 MED ORDER — FLUOXETINE HCL 20 MG PO CAPS
20.0000 mg | ORAL_CAPSULE | Freq: Every day | ORAL | Status: DC
  Administered 2016-12-16 – 2016-12-17 (×2): 20 mg via ORAL
  Filled 2016-12-16 (×3): qty 1

## 2016-12-16 MED ORDER — VALSARTAN-HYDROCHLOROTHIAZIDE 320-25 MG PO TABS: 1.0000 | ORAL_TABLET | Freq: Every day | ORAL | Status: DC

## 2016-12-16 MED ORDER — LABETALOL HCL 5 MG/ML IV SOLN: 10.0000 mg | INTRAVENOUS | Status: AC | PRN

## 2016-12-16 MED ORDER — ENSURE ENLIVE PO LIQD
237.0000 mL | Freq: Two times a day (BID) | ORAL | Status: DC
  Administered 2016-12-16: 237 mL via ORAL
  Filled 2016-12-16 (×5): qty 237

## 2016-12-16 MED ORDER — DIPHENHYDRAMINE HCL 50 MG/ML IJ SOLN
INTRAMUSCULAR | Status: AC
  Administered 2016-12-16: 25 mg via INTRAVENOUS
  Filled 2016-12-16: qty 1

## 2016-12-16 MED ORDER — MIDAZOLAM HCL 2 MG/2ML IJ SOLN
INTRAMUSCULAR | Status: AC
  Filled 2016-12-16: qty 2

## 2016-12-16 MED ORDER — TRAZODONE HCL 50 MG PO TABS
50.0000 mg | ORAL_TABLET | Freq: Every evening | ORAL | Status: DC | PRN
  Filled 2016-12-16: qty 1

## 2016-12-16 MED ORDER — NEBIVOLOL HCL 10 MG PO TABS
20.0000 mg | ORAL_TABLET | Freq: Every day | ORAL | Status: DC
  Administered 2016-12-16 – 2016-12-17 (×2): 20 mg via ORAL
  Filled 2016-12-16 (×2): qty 2

## 2016-12-16 MED ORDER — SODIUM CHLORIDE 0.9 % IV SOLN
INTRAVENOUS | Status: DC
  Administered 2016-12-16: 06:00:00 via INTRAVENOUS

## 2016-12-16 MED ORDER — SODIUM CHLORIDE 0.9% FLUSH
3.0000 mL | Freq: Two times a day (BID) | INTRAVENOUS | Status: DC
  Administered 2016-12-16 – 2016-12-17 (×2): 3 mL via INTRAVENOUS

## 2016-12-16 MED ORDER — VERAPAMIL HCL 2.5 MG/ML IV SOLN
INTRAVENOUS | Status: DC | PRN
  Administered 2016-12-16: 10 mL via INTRA_ARTERIAL

## 2016-12-16 MED ORDER — MIDAZOLAM HCL 2 MG/2ML IJ SOLN
INTRAMUSCULAR | Status: DC | PRN
  Administered 2016-12-16 (×4): 1 mg via INTRAVENOUS

## 2016-12-16 MED ORDER — ACETAMINOPHEN 500 MG PO TABS: 1000.0000 mg | ORAL_TABLET | Freq: Four times a day (QID) | ORAL | Status: DC | PRN

## 2016-12-16 MED ORDER — SODIUM CHLORIDE 0.9% FLUSH: 3.0000 mL | INTRAVENOUS | Status: DC | PRN

## 2016-12-16 MED ORDER — FUROSEMIDE 20 MG PO TABS
20.0000 mg | ORAL_TABLET | Freq: Two times a day (BID) | ORAL | Status: DC
  Administered 2016-12-16 – 2016-12-17 (×2): 20 mg via ORAL
  Filled 2016-12-16 (×3): qty 1

## 2016-12-16 MED ORDER — ISOSORBIDE MONONITRATE ER 60 MG PO TB24
60.0000 mg | ORAL_TABLET | Freq: Two times a day (BID) | ORAL | Status: DC
  Administered 2016-12-16 – 2016-12-17 (×2): 60 mg via ORAL
  Filled 2016-12-16 (×3): qty 1

## 2016-12-16 MED ORDER — HEPARIN (PORCINE) IN NACL 2-0.9 UNIT/ML-% IJ SOLN
INTRAMUSCULAR | Status: DC | PRN
  Administered 2016-12-16: 1000 mL

## 2016-12-16 MED ORDER — FAMOTIDINE IN NACL 20-0.9 MG/50ML-% IV SOLN
INTRAVENOUS | Status: AC
  Administered 2016-12-16: 20 mg via INTRAVENOUS
  Filled 2016-12-16: qty 50

## 2016-12-16 MED ORDER — ASPIRIN 81 MG PO CHEW
81.0000 mg | CHEWABLE_TABLET | Freq: Every day | ORAL | Status: DC
  Administered 2016-12-17: 81 mg via ORAL
  Filled 2016-12-16: qty 1

## 2016-12-16 MED ORDER — HYDRALAZINE HCL 20 MG/ML IJ SOLN: 5.0000 mg | INTRAMUSCULAR | Status: AC | PRN

## 2016-12-16 MED ORDER — FENTANYL CITRATE (PF) 100 MCG/2ML IJ SOLN
INTRAMUSCULAR | Status: AC
  Filled 2016-12-16: qty 2

## 2016-12-16 MED ORDER — IRBESARTAN 300 MG PO TABS
300.0000 mg | ORAL_TABLET | Freq: Every day | ORAL | Status: DC
  Administered 2016-12-16: 300 mg via ORAL
  Filled 2016-12-16 (×2): qty 1

## 2016-12-16 MED ORDER — BUDESONIDE 0.5 MG/2ML IN SUSP
0.5000 mg | Freq: Two times a day (BID) | RESPIRATORY_TRACT | Status: DC
  Administered 2016-12-16 – 2016-12-17 (×2): 0.5 mg via RESPIRATORY_TRACT
  Filled 2016-12-16 (×2): qty 2

## 2016-12-16 MED ORDER — ONDANSETRON HCL 4 MG/2ML IJ SOLN: 4.0000 mg | Freq: Four times a day (QID) | INTRAMUSCULAR | Status: DC | PRN

## 2016-12-16 MED ORDER — HYDRALAZINE HCL 50 MG PO TABS
50.0000 mg | ORAL_TABLET | Freq: Two times a day (BID) | ORAL | Status: DC
  Administered 2016-12-16: 14:00:00 50 mg via ORAL
  Filled 2016-12-16 (×3): qty 1

## 2016-12-16 MED ORDER — DIPHENHYDRAMINE HCL 50 MG/ML IJ SOLN
25.0000 mg | INTRAMUSCULAR | Status: AC
  Administered 2016-12-16: 25 mg via INTRAVENOUS

## 2016-12-16 MED ORDER — VERAPAMIL HCL 2.5 MG/ML IV SOLN
INTRAVENOUS | Status: AC
  Filled 2016-12-16: qty 2

## 2016-12-16 MED ORDER — RANOLAZINE ER 500 MG PO TB12
1000.0000 mg | ORAL_TABLET | Freq: Two times a day (BID) | ORAL | Status: DC
  Administered 2016-12-16 – 2016-12-17 (×2): 1000 mg via ORAL
  Filled 2016-12-16 (×3): qty 2

## 2016-12-16 MED ORDER — FAMOTIDINE IN NACL 20-0.9 MG/50ML-% IV SOLN
20.0000 mg | INTRAVENOUS | Status: AC
  Administered 2016-12-16: 20 mg via INTRAVENOUS

## 2016-12-16 MED ORDER — ACETAMINOPHEN-CODEINE #3 300-30 MG PO TABS: 1.0000 | ORAL_TABLET | Freq: Two times a day (BID) | ORAL | Status: DC | PRN

## 2016-12-16 MED ORDER — SODIUM CHLORIDE 0.9 % IV SOLN: 250.0000 mL | INTRAVENOUS | Status: DC | PRN

## 2016-12-16 MED ORDER — CLOPIDOGREL BISULFATE 75 MG PO TABS
75.0000 mg | ORAL_TABLET | Freq: Once | ORAL | Status: AC
  Administered 2016-12-16: 75 mg via ORAL

## 2016-12-16 MED ORDER — DIAZEPAM 5 MG PO TABS
ORAL_TABLET | ORAL | Status: AC
  Administered 2016-12-16: 5 mg via ORAL
  Filled 2016-12-16: qty 1

## 2016-12-16 MED ORDER — CLOPIDOGREL BISULFATE 75 MG PO TABS
75.0000 mg | ORAL_TABLET | Freq: Every day | ORAL | Status: DC
  Administered 2016-12-17: 09:00:00 75 mg via ORAL
  Filled 2016-12-16: qty 1

## 2016-12-16 MED ORDER — HEPARIN (PORCINE) IN NACL 2-0.9 UNIT/ML-% IJ SOLN
INTRAMUSCULAR | Status: AC
  Filled 2016-12-16: qty 1000

## 2016-12-16 MED ORDER — PANTOPRAZOLE SODIUM 40 MG PO TBEC
40.0000 mg | DELAYED_RELEASE_TABLET | Freq: Every day | ORAL | Status: DC
  Administered 2016-12-16 – 2016-12-17 (×2): 40 mg via ORAL
  Filled 2016-12-16 (×2): qty 1

## 2016-12-16 MED ORDER — AMLODIPINE BESYLATE 5 MG PO TABS
10.0000 mg | ORAL_TABLET | Freq: Every day | ORAL | Status: DC
  Administered 2016-12-16: 10 mg via ORAL
  Filled 2016-12-16 (×2): qty 2

## 2016-12-16 MED ORDER — ASPIRIN 81 MG PO CHEW
CHEWABLE_TABLET | ORAL | Status: AC
  Filled 2016-12-16: qty 1

## 2016-12-16 MED ORDER — ASPIRIN 81 MG PO CHEW
81.0000 mg | CHEWABLE_TABLET | ORAL | Status: AC
  Administered 2016-12-16: 81 mg via ORAL

## 2016-12-16 MED ORDER — IOPAMIDOL (ISOVUE-370) INJECTION 76%
INTRAVENOUS | Status: DC | PRN
  Administered 2016-12-16: 220 mL via INTRA_ARTERIAL

## 2016-12-16 MED ORDER — ATORVASTATIN CALCIUM 40 MG PO TABS
40.0000 mg | ORAL_TABLET | Freq: Every day | ORAL | Status: DC
  Administered 2016-12-16: 18:00:00 40 mg via ORAL
  Filled 2016-12-16: qty 1

## 2016-12-16 MED ORDER — FENTANYL CITRATE (PF) 100 MCG/2ML IJ SOLN
INTRAMUSCULAR | Status: DC | PRN
  Administered 2016-12-16: 25 ug via INTRAVENOUS
  Administered 2016-12-16: 50 ug via INTRAVENOUS
  Administered 2016-12-16 (×2): 25 ug via INTRAVENOUS

## 2016-12-16 MED ORDER — NITROGLYCERIN 1 MG/10 ML FOR IR/CATH LAB
INTRA_ARTERIAL | Status: AC
  Filled 2016-12-16: qty 10

## 2016-12-16 MED ORDER — LIDOCAINE HCL (PF) 1 % IJ SOLN
INTRAMUSCULAR | Status: DC | PRN
  Administered 2016-12-16: 2 mL

## 2016-12-16 MED ORDER — HEPARIN SODIUM (PORCINE) 1000 UNIT/ML IJ SOLN
INTRAMUSCULAR | Status: DC | PRN
  Administered 2016-12-16: 4000 [IU] via INTRAVENOUS
  Administered 2016-12-16: 6000 [IU] via INTRAVENOUS
  Administered 2016-12-16: 3000 [IU] via INTRAVENOUS

## 2016-12-16 MED ORDER — DIAZEPAM 5 MG PO TABS
5.0000 mg | ORAL_TABLET | Freq: Once | ORAL | Status: AC
  Administered 2016-12-16: 5 mg via ORAL

## 2016-12-16 MED ORDER — LIDOCAINE HCL (PF) 1 % IJ SOLN
INTRAMUSCULAR | Status: AC
  Filled 2016-12-16: qty 30

## 2016-12-16 MED ORDER — HYDROCHLOROTHIAZIDE 25 MG PO TABS
25.0000 mg | ORAL_TABLET | Freq: Every day | ORAL | Status: DC
  Administered 2016-12-16: 14:00:00 25 mg via ORAL
  Filled 2016-12-16 (×2): qty 1

## 2016-12-16 SURGICAL SUPPLY — 23 items
BALLN ANGIOSCULPT RX 2.5X15 (BALLOONS) ×3
BALLN EMERGE MR PUSH 1.20X20 (BALLOONS) ×2 IMPLANT
BALLN MOZEC 2.0X15 (BALLOONS) ×2
BALLN ~~LOC~~ MOZEC 2.5X15 (BALLOONS) ×3
BALLOON ANGIOSCULPT RX 2.5X15 (BALLOONS) IMPLANT
BALLOON MOZEC 2.0X15 (BALLOONS) IMPLANT
BALLOON ~~LOC~~ MOZEC 2.5X15 (BALLOONS) IMPLANT
CATH INFINITI 5FR ANG PIGTAIL (CATHETERS) ×2 IMPLANT
CATH OPTITORQUE TIG 4.0 5F (CATHETERS) ×2 IMPLANT
CATH VISTA GUIDE 6FR XBLAD3.5 (CATHETERS) ×2 IMPLANT
COVER PRB 48X5XTLSCP FOLD TPE (BAG) IMPLANT
COVER PROBE 5X48 (BAG) ×3
CUTTING BAL FLEXTOME RX 2.0X15 (BALLOONS) ×2 IMPLANT
DEVICE RAD COMP TR BAND LRG (VASCULAR PRODUCTS) ×2 IMPLANT
KIT ENCORE 26 ADVANTAGE (KITS) ×2 IMPLANT
KIT HEART LEFT (KITS) ×3 IMPLANT
PACK CARDIAC CATHETERIZATION (CUSTOM PROCEDURE TRAY) ×3 IMPLANT
SYR MEDRAD MARK V 150ML (SYRINGE) ×3 IMPLANT
TRANSDUCER W/STOPCOCK (MISCELLANEOUS) ×3 IMPLANT
TUBING CIL FLEX 10 FLL-RA (TUBING) ×3 IMPLANT
WIRE HI TORQ WHISPER MS 190CM (WIRE) ×2 IMPLANT
WIRE LUGE 182CM (WIRE) ×2 IMPLANT
WIRE PT2 MS 185 (WIRE) ×2 IMPLANT

## 2016-12-16 NOTE — Progress Notes (Signed)
Transported patient to 6c08. Upon arrival patient experienced pain in right forearm. Egg sized hematoma present just distal to elbow joint. No hematoma present from tr band site. Manual blood pressure cuff inflated at hematoma site for 5 minutes while maintaining spo2 of 90% as measured from right thumb. Dr. Ellyn Hack notified. Cuff moved lower on forearm and inflated for additional 5 minutes.  Blood pressure cuff removed. Hematoma site softer, no additional hematoma noted. Ace bandage wrap applied to right forearm.

## 2016-12-16 NOTE — H&P (View-Only) (Signed)
OFFICE NOTE  Chief Complaint:  Chest pain, dyspnea  Primary Care Physician: Arnoldo Morale, MD  HPI:  Andrew West  is a 51 year old gentleman with a history of coronary artery disease and stent placement to the circumflex and obtuse marginal and a history of in stent restenosis to the LAD. There is also a nondominant right coronary artery that is 100% occluded, filled with collaterals. He did have a stress test recently, which showed an EF of 53% and reversible septal ischemia in the setting of chest pain and after much convincing underwent cardiac catheterization.  He then underwent cutting balloon angioplasty to an ostial lateral OM1 branch and the bifurcation AV groove circumflex OM junction. This was successful at reducing the stenosis to 0%. This was in November 2012 and he did not return for followup appointment until April 2013. He was suffering from low back pain and has been evaluated and treated by Dr. Trenton Gammon with a laminectomy/discectomy, which he says was not helpful. Otherwise, he continues to smoke and occasionally complains of shortness of breath and some chest pain symptoms which are atypical. He is on long-acting and/or has not needed to take short-acting nitroglycerin. His other concern today is that he is having problems with his teeth and was recommended to have edentulation and by an oral surgeon Dr. Diona Browner. Finally, he is suffering from significant stress and depression due to recent separation with wife in dealing with his kids. This seems to be a big tablets on his continued smoking.  Andrew West returns today in the office. He feels fairly well. He denies any chest pain. He continues to have problems with low back pain and numbness and tingling in his legs. He is apparently status post laminectomy and microdiscectomy by Dr. Trenton Gammon and continues to have symptoms which may be related to that. He's also complaining of what sounds like neuropathic pain. He is  not currently on medication. He is asking for Tylenol 3 for pain today which I told him I did not prescribe. Ultimately there are no further surgical or nonsurgical options, he may need to go on a neuropathic pain medication or perhaps be referred to a pain management specialist. He is also looking for a new primary care provider as his primary care provider is close to retirement. He reports he has cut back his smoking to about 1 pack every 2 weeks. He is also been out of amlodipine and simvastatin although his blood pressure is well controlled.  I saw Andrew West in the office today. He is reporting now complains of shortness of breath and chest pain. He also feels like his breathing is worse when working around chemicals at work. He uses a respiratory protection mask but also feels like he is under significant stress. He's had missed several days of work and is concerned about his job. Sounds like he is in a difficult work environment. He says that he is apologizing for "deceiving me" that he was not honest about his symptoms. He apparently has been having shortness of breath and chest pain for several months and did not relay that to me.  Andrew West returns to the office today for follow-up. His main complaint is shortness of breath. He's not feeling as much chest pain he's had been previously. We recently did a nuclear stress test which was negative for ischemia and showed an EF of 56%. With regards to shortness of breath he's concerned most of this was related to chemicals at work although  he has not worked in several months. He's had about 20 pound weight gain since we last saw him in the office and denies any lower extremity swelling, orthopnea or PND. Shortness of breath is persistent and is associated with some wheezing. He did see Dr. 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 Shortness of breath seems to be a little bit worse with exertion which makes me wonder whether there is an element of exercise-induced pulmonary hypertension. He's not had an echocardiogram in some time.  Andrew West returns today for follow-up. He was noted to have some diastolic dysfunction on his echocardiogram. I started him on low-dose Lasix and check lab work including a BNP which is very low around 50. On follow-up today he reports he feels no different with the addition of Lasix. I therefore asked him to take it as needed. He is also describing worsening substernal chest discomfort and a squeezing pressure in his chest today. He says this is been getting worse over the past several days, more than his typical chest discomfort. As previously noted he recently underwent a nuclear stress test a few months ago which was negative for ischemia.   Andrew West returns for follow-up. As mentioned he had had recent progressive chest pain symptoms and worsening shortness of breath. He underwent another cardiac catheterization which demonstrated the following:   Ost 2nd Mrg to 2nd Mrg lesion, 99% stenosed. Post intervention, 99% residual stenosis remained. The lesion was previously treated with a bare metal stentgreater than two years ago.  Mid Cx-2 lesion, 90% stenosed. Post intervention, there is a 20% residual stenosis.  Mid Cx-1 lesion, 80% stenosed. Post intervention, there is a 50% residual stenosis. The lesion was previously treated with a bare metal stent.  Ramus lesion, 100% stenosed. The lesion was previously treated with a bare metal stentgreater than two years ago.  Prox RCA lesion, 100% stenosed.  There is mild left ventricular systolic dysfunction.  Moderately elevated LVEDP of 20 mmHg   Difficult situation with what amounts to be a totally occluded (In-stent re-stenosis) of the OM2 branch with sub-optimal PTCA of the mid AV-Groove In-stent restenosis. He  continues to have chest pain although reports it somewhat improved. He was started on ranolazine and currently is on 1000 mg twice a day.  Andrew West returns today for follow-up. He reports he is doing fairly well on medical therapy. He still gets some sharp chest pain mostly along the right sternal border. He does get short of breath with moderate exertion. He's trying to do some walking on a treadmill but generally stops the exercise once he gets short of breath because he is very nervous. He is not currently working due to combined cardiac pulmonary and musculoskeletal diseases. He says that he and his wife are going to go on a delayed honeymoon since they never took one.  04/20/2016  Andrew West returns today for follow-up. He was recently seen in the hospital in the beginning of May for chest pain. He reports that it's up in the left neck base around the area of the left clavicle. It's very exquisitely tender to touch and feels sharp and electric. This pain is related we think to cervical pain. He ruled out for MI and was not worked up further for coronary ischemia. This pain is felt to be distinctly different. His isosorbide was  increased up to 60 mg twice a day but he notes no change in his symptoms with that. His primary care providers hesitant to provide any pain medication.  06/28/2016  Andrew West was seen back today in follow-up. He recently was in the ER at [facility] the left without being seen after 3 hours. An EKG was performed and showed no ischemic changes. He reported going home and taking some nitroglycerin and aspirin with some eventual improvement in his symptoms. He continues to have a sharp left chest discomfort which is tender to the touch and is persistent. It's not necessarily worse with exertion or relieved by rest. I felt this may likely be a neuropathic pain and I recommended starting gabapentin 300 mg daily at bedtime. He reports no improvement with this medication. I advised him to  discontinue today. He's also concerned about poor sleep at night, significant preoccupation with death and anxiety. This is concerning for possible PTSD type symptoms.  08/23/2016  Andrew West returns today for follow-up. I referred him to behavioral health and he says that he went but it was not helpful. The medical record however does not show any evidence that he made that appointment. He did present to the emergency department on 06/29/2016 with depression and anxiety and was felt not to need inpatient treatment. He underwent nuclear stress testing which was negative for ischemia and showed normal LV function. I do not believe is ongoing chest pain at this time is due to ischemia. Blood pressure is elevated today however 142/102. A recheck was 138/89.  11/03/2016  I saw Andrew West today for follow-up again. He is complaining of persistent chest pain which is chronic, tends to be present for most of the day at rest and with exertion. He also complains of shortness of breath. Surprisingly blood pressure is elevated today, much higher than it has been in the past at 150/105. He reports compliance with his medications. This could be contributing to his problems. He is interested in another heart catheterization. I did remind him that we performed a stress test only 3 months ago which was negative for ischemia. His last coronary intervention was about a year ago which she had some percutaneous angioplasty but no new stent placement. He has complex anatomy that was not amenable to PCI, rather medical therapy was recommended however he has been on maximal doses of nitrates, Ranexa, beta blocker and other blood pressure medications. Interestingly, despite a high dose of metoprolol, his heart rate remains up in the 80s. EKG today is nonischemic.  12/02/2016  Andrew West returns today for follow-up. He reports despite changing his medications which has resulted in improvement in blood pressure that he still has  significant chest discomfort and shortness of breath. He is certain that there is a new blockage in his heart that the cause of this. He believes he needs another heart catheterization. Symptoms seem similar to his symptoms prior to his last cutting balloon angioplasty more than 2 years ago.  PMHx:  Past Medical History:  Diagnosis Date  . CAD (coronary artery disease)    2002; treated with stent to mid L circumflex  . Chronic back pain   . Chronic leg pain    bilateral  . Depression   . Diabetes mellitus   . GERD (gastroesophageal reflux disease)    Takes Dexilant  . HLD (hyperlipidemia)   . Hypertension   . Rhabdomyolysis    h/o, r/t statins    Past Surgical History:  Procedure Laterality  Date  . CARDIAC CATHETERIZATION N/A 09/25/2015   Procedure: Left Heart Cath and Coronary Angiography;  Surgeon: Leonie Man, MD;  Location: [facility];  Service: Cardiovascular;  Laterality: N/A;  . COLONOSCOPY W/ POLYPECTOMY    . CORONARY ANGIOPLASTY  09/25/2015   mid cir & om  . CORONARY ANGIOPLASTY WITH STENT PLACEMENT  10/09/2001   PTCA & stenting of mid AV circumflex; 2.5x86m Pixel stent  . CORONARY ANGIOPLASTY WITH STENT PLACEMENT  12/13/2001   PCI with stent to mid L circumflex, 95% stenosis to 0% residual  . CORONARY ANGIOPLASTY WITH STENT PLACEMENT  10/10/2003   PCI to mid AV circumflex; LAD 30% disease; RCA 100% occluded prox.  . CORONARY ANGIOPLASTY WITH STENT PLACEMENT  09/01/2011   PCI with stenting with bare metal stent to mid AV groove circumflex and PDA  . CORONARY ANGIOPLASTY WITH STENT PLACEMENT  10/17/2011   cutting balloon angioplasty of ostial lateral OM1 branch and bifurcation AV groove circumflex OM junction; stenosis reduced to 0%  . LEFT HEART CATHETERIZATION WITH CORONARY ANGIOGRAM N/A 10/18/2011   Procedure: LEFT HEART CATHETERIZATION WITH CORONARY ANGIOGRAM;  Surgeon: DLeonie Man MD;  [facility];  Service: Cardiovascular;  Laterality:  N/A;  . LUMBAR LAMINECTOMY/DECOMPRESSION MICRODISCECTOMY  03/31/2012   Procedure: LUMBAR LAMINECTOMY/DECOMPRESSION MICRODISCECTOMY 1 LEVEL;  Surgeon: HCharlie Pitter MD;  [facility];  Service: Neurosurgery;  Laterality: Left;  . TRANSTHORACIC ECHOCARDIOGRAM  07/28/2011   EF 55-65%; LVH, grade 1 diastolic dysfunction;     FAMHx:  Family History  Problem Relation Age of Onset  . Heart attack Father   . Hypertension Mother   . Diabetes Mother   . Heart disease Brother     x 3   . Heart attack Brother     deceased  . Hypertension Sister   . Diabetes Sister   . Anesthesia problems Neg Hx   . Hypotension Neg Hx   . Malignant hyperthermia Neg Hx   . Pseudochol deficiency Neg Hx     SOCHx:   reports that he quit smoking about 18 months ago. His smoking use included Cigarettes. He has a 6.25 pack-year smoking history. He has never used smokeless tobacco. He reports that he does not drink alcohol or use drugs.  ALLERGIES:  Allergies  Allergen Reactions  . Iohexol     PT. TO BE PREMEDICATED PRIOR TO IV CONTRAST PER DR SKris Hartmann/MMS//12/15/15Desc: PT BECAME SOB AND CHEST TIGHTNESS AFTER CONTRAST INJECTION.  [name],RT-RCT., Onset Date: [id number]  . Vicodin [Hydrocodone-Acetaminophen] Itching and Rash    ROS: Pertinent items noted in HPI and remainder of comprehensive ROS otherwise negative.  HOME MEDS: Current Outpatient Prescriptions  Medication Sig Dispense Refill  . acetaminophen (TYLENOL) 500 MG tablet Take 1,000 mg by mouth every 6 (six) hours as needed for headache.    .Marland Kitchenacetaminophen-codeine (TYLENOL #3) 300-30 MG tablet Take 1 tablet by mouth every 12 (twelve) hours as needed for moderate pain. 50 tablet 0  . Albuterol Sulfate (PROAIR RESPICLICK) 1952(90 Base) MCG/ACT AEPB Inhale 1 puff into the lungs every 6 (six) hours as needed (shortness of breath). 1 each 2  . aspirin 81 MG tablet Take 1 tablet (81 mg total) by mouth daily.    . ciprofloxacin (CIPRO) 500 MG  tablet Take 1 tablet (500 mg total) by mouth 2 (two) times daily. 20 tablet 0  . clopidogrel (PLAVIX) 75 MG tablet Take 1 tablet (75 mg total) by mouth daily.  90 tablet 3  . FLUoxetine (PROZAC) 20 MG tablet Take 1 tablet (20 mg total) by mouth daily. 30 tablet 3  . furosemide (LASIX) 20 MG tablet Take 1 tablet (20 mg total) by mouth daily as needed for fluid or edema. 30 tablet 2  . hydrALAZINE (APRESOLINE) 50 MG tablet Take 1 tablet (50 mg total) by mouth 2 (two) times daily. 180 tablet 3  . isosorbide mononitrate (IMDUR) 30 MG 24 hr tablet Take 2 tablets (60 mg total) by mouth 2 (two) times daily. 60 tablet 3  . meloxicam (MOBIC) 7.5 MG tablet Take 1 tablet (7.5 mg total) by mouth daily. 30 tablet 0  . metFORMIN (GLUCOPHAGE) 1000 MG tablet Take 1 tablet (1,000 mg total) by mouth 2 (two) times daily with a meal. 180 tablet 1  . mometasone (ASMANEX) 220 MCG/INH inhaler Inhale 2 puffs into the lungs daily. 1 Inhaler 2  . Nebivolol HCl 20 MG TABS Take 1 tablet (20 mg total) by mouth daily. 90 tablet 3  . nitroGLYCERIN (NITROSTAT) 0.4 MG SL tablet Place 1 tablet (0.4 mg total) under the tongue every 5 (five) minutes as needed for chest pain. 25 tablet 11  . pantoprazole (PROTONIX) 40 MG tablet Take 1 tablet (40 mg total) by mouth daily. 30 tablet 11  . ranolazine (RANEXA) 1000 MG SR tablet Take 1 tablet (1,000 mg total) by mouth 2 (two) times daily. 180 tablet 3  . simvastatin (ZOCOR) 20 MG tablet Take 1 tablet by mouth daily.  3  . traZODone (DESYREL) 50 MG tablet Take 1 tablet (50 mg total) by mouth at bedtime as needed for sleep. 30 tablet 3  . valsartan-hydrochlorothiazide (DIOVAN HCT) 320-25 MG tablet Take 1 tablet by mouth daily. 90 tablet 1  . amLODipine (NORVASC) 10 MG tablet Take 1 tablet (10 mg total) by mouth daily. 30 tablet 3   No current facility-administered medications for this visit.     LABS/IMAGING: No results found for this or any previous visit (from the past 48 hour(s)). No  results found.  VITALS: BP (!) 142/91   Pulse 82   Ht '5\' 8"'$  (1.727 m)   Wt 264 lb 3.2 oz (119.8 kg)   SpO2 98%   BMI 40.17 kg/m   EXAM: Deferred  EKG: Deferred  ASSESSMENT:  1. Chest pain and dyspnea at rest and with exertion - Occluded OM2 at a prior stent - no good       revascularization options - low risk Myoview with no ischemia (07/2016) 2. Coronary artery disease status post PCI and cutting balloon angioplasty over 2 years ago 3. Ongoing tobacco abuse 4. Uncontrolled hypertension 5. Dyslipidemia 6. Neuropathy 7. Persistent low back pain 8. Diabetes type 2 9. Morbid obesity 10. Anxiety/?PTSD  PLAN: 1.   Mr. Charlesworth continues to have progressive chest pain despite a recent low risk Myoview without any ischemia in September 2017. He does have an occluded OM 2 and a prior stent without good revascularization options. He has failed radical therapy and is on high-dose Ranexa and nitrate. Blood pressure is now better controlled however his symptoms persist. I will plan to refer him for repeat cardiac catheterization with Dr. Ellyn Hack, per his request, to determine if there are any interventional options for him.  Follow-up afterwards.  Pixie Casino, MD, [facility] Attending Cardiologist [place] 12/02/2016, 1:06 PM

## 2016-12-16 NOTE — Care Management Note (Signed)
Case Management Note  Patient Details  Name: Andrew West MRN: [id number] Date of Birth: Feb 07, 1966  Subjective/Objective:     S/p coronary balloon angioplasty, will be on plavix, NCM will cont to follow for dc needs.               Action/Plan:   Expected Discharge Date:                  Expected Discharge Plan:  Home/Self Care  In-House Referral:     Discharge planning Services  CM Consult  Post Acute Care Choice:    Choice offered to:     DME Arranged:    DME Agency:     HH Arranged:    HH Agency:     Status of Service:  In process, will continue to follow  If discussed at Long Length of Stay Meetings, dates discussed:    Additional Comments:  Zenon Mayo, RN 12/16/2016, 11:32 AM

## 2016-12-16 NOTE — Progress Notes (Signed)
TR BAND REMOVAL  LOCATION:    right radial  DEFLATED PER PROTOCOL:    Yes.    TIME BAND OFF / DRESSING APPLIED:    1415   SITE UPON ARRIVAL:    Level 0 (initial assessment :with hematoma on  anterior forearm distal to TRB)  SITE AFTER BAND REMOVAL:    Level 0 ( see comment)  CIRCULATION SENSATION AND MOVEMENT:    Within Normal Limits   Yes.    COMMENTS:   Initial assessment on arrival to the unit : Site  with anterior forearm hematoma   Distal to TRB,  pressure held , with ace wrapped by Andrew West cvl staff ,  hematoma resolved, some puffiness but soft. Good cap refill , tolerated procedure .

## 2016-12-16 NOTE — Interval H&P Note (Signed)
History and Physical Interval Note:  12/16/2016 7:13 AM  Andrew West  has presented today for surgery, with the diagnosis of - Progressive Angina (Class III). The various methods of treatment have been discussed with the patient and family. After consideration of risks, benefits and other options for treatment, the patient has consented to  Procedure(s): Left Heart Cath and Coronary Angiography (N/A) with possible Percutaneous Coronary Intervention as a surgical intervention .  The patient's history has been reviewed, patient examined, no change in status, stable for surgery.  I have reviewed the patient's chart and labs.  Questions were answered to the patient's satisfaction.    Cath Lab Visit (complete for each Cath Lab visit)  Clinical Evaluation Leading to the Procedure:   ACS: No.  Non-ACS:    Anginal Classification: CCS III  Anti-ischemic medical therapy: Maximal Therapy (2 or more classes of medications)  Non-Invasive Test Results: Low-risk stress test findings: cardiac mortality <1%/year - Progressive Sx despite Non-ischemic Myoview  Prior CABG: No previous CABG   Andrew West

## 2016-12-17 ENCOUNTER — Encounter (HOSPITAL_COMMUNITY): Payer: Self-pay | Admitting: Physician Assistant

## 2016-12-17 ENCOUNTER — Telehealth: Payer: Self-pay | Admitting: Internal Medicine

## 2016-12-17 DIAGNOSIS — E782 Mixed hyperlipidemia: Secondary | ICD-10-CM

## 2016-12-17 DIAGNOSIS — I200 Unstable angina: Secondary | ICD-10-CM | POA: Diagnosis not present

## 2016-12-17 DIAGNOSIS — E0800 Diabetes mellitus due to underlying condition with hyperosmolarity without nonketotic hyperglycemic-hyperosmolar coma (NKHHC): Secondary | ICD-10-CM

## 2016-12-17 DIAGNOSIS — Z9861 Coronary angioplasty status: Secondary | ICD-10-CM | POA: Diagnosis not present

## 2016-12-17 DIAGNOSIS — I2582 Chronic total occlusion of coronary artery: Secondary | ICD-10-CM | POA: Diagnosis not present

## 2016-12-17 DIAGNOSIS — I2510 Atherosclerotic heart disease of native coronary artery without angina pectoris: Secondary | ICD-10-CM

## 2016-12-17 DIAGNOSIS — I25110 Atherosclerotic heart disease of native coronary artery with unstable angina pectoris: Secondary | ICD-10-CM | POA: Diagnosis not present

## 2016-12-17 DIAGNOSIS — T82855A Stenosis of coronary artery stent, initial encounter: Secondary | ICD-10-CM | POA: Diagnosis not present

## 2016-12-17 DIAGNOSIS — I97630 Postprocedural hematoma of a circulatory system organ or structure following a cardiac catheterization: Secondary | ICD-10-CM | POA: Diagnosis not present

## 2016-12-17 LAB — CBC
HEMATOCRIT: 39.2 % (ref 39.0–52.0)
Hemoglobin: 13.2 g/dL (ref 13.0–17.0)
MCH: 27.1 pg (ref 26.0–34.0)
MCHC: 33.7 g/dL (ref 30.0–36.0)
MCV: 80.5 fL (ref 78.0–100.0)
Platelets: 236 10*3/uL (ref 150–400)
RBC: 4.87 MIL/uL (ref 4.22–5.81)
RDW: 13.4 % (ref 11.5–15.5)
WBC: 14.4 10*3/uL — ABNORMAL HIGH (ref 4.0–10.5)

## 2016-12-17 LAB — GLUCOSE, CAPILLARY: GLUCOSE-CAPILLARY: 95 mg/dL (ref 65–99)

## 2016-12-17 LAB — BASIC METABOLIC PANEL
Anion gap: 8 (ref 5–15)
BUN: 12 mg/dL (ref 6–20)
CHLORIDE: 106 mmol/L (ref 101–111)
CO2: 26 mmol/L (ref 22–32)
Calcium: 8.8 mg/dL — ABNORMAL LOW (ref 8.9–10.3)
Creatinine, Ser: 0.92 mg/dL (ref 0.61–1.24)
GFR calc Af Amer: >60 mL/min (ref >60)
GLUCOSE: 115 mg/dL — AB (ref 65–99)
POTASSIUM: 3.6 mmol/L (ref 3.5–5.1)
Sodium: 140 mmol/L (ref 135–145)

## 2016-12-17 MED ORDER — OXYCODONE-ACETAMINOPHEN 5-325 MG PO TABS: 1.0000 | ORAL_TABLET | Freq: Three times a day (TID) | ORAL | 0 refills | Status: DC | PRN

## 2016-12-17 NOTE — Progress Notes (Signed)
Patient Name: Andrew West Date of Encounter: 12/17/2016  Primary Cardiologist: Dr. [facility] - Closed Problem List     Principal Problem:   Progressive angina ([place]) -Class III Active Problems:   Presence of stent in left circumflex coronary artery   Coronary artery disease involving native coronary artery of native heart with angina pectoris (HCC)     Subjective   No CP or SOB this AM. Pain at radial cath site. Ready to go home.   Inpatient Medications    Scheduled Meds: . amLODipine  10 mg Oral Daily  . aspirin  81 mg Oral Daily  . atorvastatin  40 mg Oral q1800  . budesonide  0.5 mg Nebulization BID  . clopidogrel  75 mg Oral Daily  . feeding supplement (ENSURE ENLIVE)  237 mL Oral BID BM  . FLUoxetine  20 mg Oral Daily  . furosemide  20 mg Oral BID PC  . hydrALAZINE  50 mg Oral BID  . irbesartan  300 mg Oral Daily   And  . hydrochlorothiazide  25 mg Oral Daily  . isosorbide mononitrate  60 mg Oral BID  . [START ON 12/18/2016] metFORMIN  1,000 mg Oral BID WC  . nebivolol  20 mg Oral Daily  . pantoprazole  40 mg Oral Daily  . ranolazine  1,000 mg Oral BID  . sodium chloride flush  3 mL Intravenous Q12H   Continuous Infusions:  PRN Meds: sodium chloride, acetaminophen, acetaminophen-codeine, albuterol, nitroGLYCERIN, ondansetron (ZOFRAN) IV, oxyCODONE-acetaminophen, sodium chloride flush, traZODone   Vital Signs    Vitals:   12/16/16 2200 12/16/16 2206 12/16/16 2208 12/17/16 0239  BP: (!) 95/51 (!) 90/51 (!) 95/58 (!) 104/39  Pulse: 65 67 66 66  Resp: '19 19 20 11  '$ Temp:    97.8 F (36.6 C)  TempSrc:    Oral  SpO2: 96% 97% 96% 94%  Weight:    261 lb 14.5 oz (118.8 kg)  Height:        Intake/Output Summary (Last 24 hours) at 12/17/16 0640 Last data filed at 12/17/16 0430  Gross per 24 hour  Intake              360 ml  Output              300 ml  Net               60 ml   Filed Weights   12/16/16 0535 12/17/16 0239  Weight: 251 lb (113.9 kg) 261  lb 14.5 oz (118.8 kg)    Physical Exam   GEN: Well nourished, well developed, in no acute distress.  HEENT: Grossly normal.  Neck: Supple, no JVD, carotid bruits, or masses. Cardiac: RRR, no murmurs, rubs, or gallops. No clubbing, cyanosis, edema.  Radials/DP/PT 2+ and equal bilaterally.  Respiratory:  Respirations regular and unlabored, clear to auscultation bilaterally. GI: Soft, nontender, nondistended, BS + x 4. MS: no deformity or atrophy. Skin: warm and dry, no rash. Neuro:  Strength and sensation are intact. Psych: AAOx3.  Normal affect.  Labs    CBC  Recent Labs  12/17/16 0228  WBC 14.4*  HGB 13.2  HCT 39.2  MCV 80.5  PLT 841   Basic Metabolic Panel  Recent Labs  12/17/16 0228  NA 140  K 3.6  CL 106  CO2 26  GLUCOSE 115*  BUN 12  CREATININE 0.92  CALCIUM 8.8*     Telemetry    NSR with PVCs - Personally  Reviewed  ECG    NSR, prolonged QT - Personally Reviewed  Radiology    No results found.  Cardiac Studies   12/16/16 Coronary Balloon Angioplasty  Left Heart Cath and Coronary Angiography  Conclusion    ____CULPRIT LESION________  Colon Flattery 2nd Mrg to 2nd Mrg lesion, 100 %stenosed. In-stent stenosis  Mid Cx-1 lesion, 100 %stenosed. In an pre-stent stenosis --> PTCA only performed Post intervention, there is a 50% residual stenosis.  Mid Cx-2 lesion, 100 %stenosed. PTCA only performed Post intervention, there is a 50% residual stenosis.  Ost LPDA to LPDA lesion, 100 %stenosed.  Ost 3rd Mrg to 3rd Mrg lesion, 90 %stenosed.  Ost 4th Mrg to 4th Mrg lesion, 80 %stenosed.  __________________________  Mid LAD to Dist LAD lesion, 45 %stenosed.  Ramus lesion, 100 %stenosed. - In-stent stenosis  Prox RCA lesion, 100 %stenosed. - Chronic  The left ventricular systolic function is normal.  LV end diastolic pressure is normal.  There is no aortic valve stenosis.   Unfortunately the patient had total occlusion of the stented segment in  the circumflex that involves the dominant circumflex vessels. The stented OM 2 branch now 100% occluded. Unfortunately, suboptimal minimally effective PTCA was performed. Unable to pass a stent to the desired location. The downstream vessels were extremely diffusely diseased and likely not good targets for bypass, however this could be considered.  Residual diffuse disease was the best that can be done with PTCA. Unable to pass stent. Very rigid lesion wiring high pressure inflation of a noncompliant balloon, but still unable to pass a stent.  PLAN: Overnight observation and prolonged PTCA procedure. We'll need to maximize medical management per Dr. Debara Pickett     Patient Profile     Andrew West is a 51 y.o. male with a history of complex multivessel CAD s/p multiple interventions, continued tobacco abuse, HTN, HLD, DMT2, morbid obesity and COPD who presented to [facility] on 12[facility]16 for planned cardiac cath.    Assessment & Plan    Class III angina: despite maximal medical therapy with amlodipine '10mg'$  daily, bystolic '20mg'$  daily, Ranexa '1000mg'$  BID and imdur '60mg'$  daily. He underwent cardiac cath on 12/16/16 which showed 100% occl OM2, 100% occl LCx s/p PCTA ( 50% residual stenosis and unable to pass stent), 100% occl LPDA,  90% OM3, 80% OM4, 45% LAD, 100% occl RI, CTO RCA, normal LV function and LVEDP. Plan is for maximal medical therapy. The downstream vessels were extremely diffusely diseased and likely not good targets for bypass, however this could be considered -- Continue ASA/Plavix, statin and BB. Could try to increase imdur from '60mg'$  to '90mg'$  daily.  -- Has pain at radial cath site, benign on exam with good pulse.   HTN: BPs have been soft this AM.   HLD: LDL 73. Continue atorvastatin '40mg'$  daily.   DMT2: hold Metformin at least 48 hours post cath   Tobacco abuse: counseled on cessation.    Signed, Angelena Form, PA-C  12/17/2016, 6:40 AM   Patient seen and examined and history  reviewed. Agree with above findings and plan. Feeling OK. No chest pain. Right arm sore. Pulse is good and arm is soft. Hand is warm. BP is low this am. Will hold Avapro/HCTZ and hydralazine this am. Will stop hydralazine altogether but resume other meds in am. He is stable for DC today. Cath films reviewed. Extensive stenting of distal LCx in past - occluded. Flow restored with POBA but long term patency is not favorable. Will continue  medical management.  Peter Martinique, [place] 12/17/2016 9:15 AM

## 2016-12-17 NOTE — Discharge Summary (Signed)
Discharge Summary    Patient ID: Andrew West,  MRN: [id number], DOB/AGE: 1965-11-26 51 y.o.  Admit date: 12/16/2016 Discharge date: 12/17/2016  Primary Care Provider: Jaclyn Shaggy Primary Cardiologist: Dr. Rennis Golden   Discharge Diagnoses    Principal Problem:   Progressive angina (HCC) -Class III Active Problems:   Diabetes mellitus (HCC)   HLD (hyperlipidemia)   Obesity   TOBACCO ABUSE   GERD   Cervical disc disorder with radiculopathy of cervical region   COPD with asthma (HCC)   Hypertension   CAD S/P multiple PCI's   Allergies Allergies  Allergen Reactions  . Iohexol     PT. TO BE PREMEDICATED PRIOR TO IV CONTRAST PER DR Eppie Gibson /MMS//12/15/15Desc: PT BECAME SOB AND CHEST TIGHTNESS AFTER CONTRAST INJECTION.  [name],RT-RCT., Onset Date: [id number]   . Vicodin [Hydrocodone-Acetaminophen] Itching and Rash    Pt tolerates ok     History of Present Illness     Andrew West is a 51 y.o. male with a history of complex multivessel CAD s/p multiple interventions, continued tobacco abuse, HTN, HLD, DMT2, morbid obesity and COPD who presented to [facility] on 12/16/16 for planned cardiac cath.   He was seen in the office by Dr. Rennis Golden on 12/02/16 for chest pain similar to his symptoms prior to his last cutting balloon angioplasty more than 2 years ago. The patient really felt like he needed a heart cath so it was decided to proceed with coronary angiography on 12/16/16.   Hospital Course     Consultants: none  Class III angina: despite maximal medical therapy with amlodipine 10mg  daily, bystolic 20mg  daily, Ranexa 1000mg  BID and imdur 60mg  daily. He underwent cardiac cath on 12/16/16 which showed 100% occl OM2, 100% occl LCx s/p PCTA ( 50% residual stenosis and unable to pass stent), 100% occl LPDA,  90% OM3, 80% OM4, 45% LAD, 100% occl RI, CTO RCA, normal LV function and LVEDP. Plan is for maximal medical therapy. The downstream vessels were extremely diffusely  diseased and likely not good targets for bypass, however this could be considered -- Continue ASA/Plavix, statin and BB.  -- Has pain at radial cath site, benign on exam with good pulse.   HTN: BPs have been soft this AM.  Will hold Avapro/HCTZ and hydralazine this am. Will stop hydralazine altogether but resume other meds tomorrow AM.   HLD: LDL 73. Continue atorvastatin 40mg  daily.   DMT2: hold Metformin at least 48 hours post cath (detailed in discharged instructions)  Tobacco abuse: counseled on cessation.     The patient has had an uncomplicated hospital course and is recovering well. The radial catheter site is stable He has been seen by Dr. [place] today and deemed ready for discharge home. I was unable to make a follow up appointment because it says that he was "dismissed from our practice." I have sent a staff message to scheduling at [facility] to help rectify this and make an appointment.  Smoking cessation was disscussed in length. Discharge medications are listed below.  _____________  Discharge Vitals Blood pressure 126/90, pulse (!) 58, temperature 98 F (36.7 C), temperature source Oral, resp. rate 16, height 5\' 8"  (1.727 m), weight 261 lb 14.5 oz (118.8 kg), SpO2 98 %.  Filed Weights   12/16/16 0535 12/17/16 0239  Weight: 251 lb (113.9 kg) 261 lb 14.5 oz (118.8 kg)    Labs & Radiologic Studies     CBC  Recent Labs  12/17/16 0228  WBC  14.4*  HGB 13.2  HCT 39.2  MCV 80.5  PLT 236   Basic Metabolic Panel  Recent Labs  12/17/16 0228  NA 140  K 3.6  CL 106  CO2 26  GLUCOSE 115*  BUN 12  CREATININE 0.92  CALCIUM 8.8*   Liver Function Tests No results for input(s): AST, ALT, ALKPHOS, BILITOT, PROT, ALBUMIN in the last 72 hours. No results for input(s): LIPASE, AMYLASE in the last 72 hours. Cardiac Enzymes No results for input(s): CKTOTAL, CKMB, CKMBINDEX, TROPONINI in the last 72 hours. BNP Invalid input(s): POCBNP D-Dimer No results for  input(s): DDIMER in the last 72 hours. Hemoglobin A1C No results for input(s): HGBA1C in the last 72 hours. Fasting Lipid Panel No results for input(s): CHOL, HDL, LDLCALC, TRIG, CHOLHDL, LDLDIRECT in the last 72 hours. Thyroid Function Tests No results for input(s): TSH, T4TOTAL, T3FREE, THYROIDAB in the last 72 hours.  Invalid input(s): FREET3  Dg Chest 2 View  Result Date: 12/13/2016 CLINICAL DATA:  Coronary artery disease. Preoperative evaluation for cardiac catheterization. Chest pain EXAM: CHEST  2 VIEW COMPARISON:  Chest radiograph June 17, 2016; chest CT July 05, 2016 FINDINGS: There is no edema or consolidation. Heart size and pulmonary vascularity are normal. No adenopathy. There is atherosclerotic calcification in the aorta. No bone lesions. IMPRESSION: Aortic atherosclerosis.  No edema or consolidation. Electronically Signed   By: Bretta Bang III M.D.   On: 12/13/2016 08:38     Diagnostic Studies/Procedures    12/16/16 Coronary Balloon Angioplasty  Left Heart Cath and Coronary Angiography  Conclusion    ____CULPRIT LESION________  Maxwell Caul to 2nd Mrg lesion, 100 %stenosed. In-stent stenosis  Mid Cx-1 lesion, 100 %stenosed. In an pre-stent stenosis --> PTCA only performed Post intervention, there is a 50% residual stenosis.  Mid Cx-2 lesion, 100 %stenosed. PTCA only performed Post intervention, there is a 50% residual stenosis.  Ost LPDA to LPDA lesion, 100 %stenosed.  Ost 3rd Mrg to 3rd Mrg lesion, 90 %stenosed.  Ost 4th Mrg to 4th Mrg lesion, 80 %stenosed.  __________________________  Mid LAD to Dist LAD lesion, 45 %stenosed.  Ramus lesion, 100 %stenosed. - In-stent stenosis  Prox RCA lesion, 100 %stenosed. - Chronic  The left ventricular systolic function is normal.  LV end diastolic pressure is normal.  There is no aortic valve stenosis.  Unfortunately the patient had total occlusion of the stented segment in the circumflex that involves  the dominant circumflex vessels. The stented OM 2 branch now 100% occluded. Unfortunately, suboptimal minimally effective PTCA was performed. Unable to pass a stent to the desired location. The downstream vessels were extremely diffusely diseased and likely not good targets for bypass, however this could be considered.  Residual diffuse disease was the best that can be done with PTCA. Unable to pass stent. Very rigid lesion wiring high pressure inflation of a noncompliant balloon, but still unable to pass a stent.  PLAN: Overnight observation and prolonged PTCA procedure. We'll need to maximize medical management per Dr. Rennis Golden    _____________    Disposition   Pt is being discharged home today in good condition.  Follow-up Plans & Appointments    Follow-up Information    Chrystie Nose, MD Follow up.   Specialty:  Cardiology Why:  The office will call you you arrange an appointment in the next 1-2 weeks Contact information: [address] [place] [address] [phone]          Discharge Instructions  Amb Referral to Cardiac Rehabilitation    Complete by:  As directed    Does not want to do program   Diagnosis:  PTCA      Discharge Medications     Medication List    STOP taking these medications   hydrALAZINE 50 MG tablet Commonly known as:  APRESOLINE   predniSONE 20 MG tablet Commonly known as:  DELTASONE     TAKE these medications   acetaminophen 500 MG tablet Commonly known as:  TYLENOL Take 1,000 mg by mouth every 6 (six) hours as needed for headache.   acetaminophen-codeine 300-30 MG tablet Commonly known as:  TYLENOL #3 Take 1 tablet by mouth every 12 (twelve) hours as needed for moderate pain.   Albuterol Sulfate 108 (90 Base) MCG/ACT Aepb Commonly known as:  PROAIR RESPICLICK Inhale 1 puff into the lungs every 6 (six) hours as needed (shortness of breath).   amLODipine 10 MG tablet Commonly known as:  NORVASC Take  1 tablet (10 mg total) by mouth daily.   aspirin 81 MG tablet Take 1 tablet (81 mg total) by mouth daily.   atorvastatin 40 MG tablet Commonly known as:  LIPITOR Take 1 tablet (40 mg total) by mouth daily.   clopidogrel 75 MG tablet Commonly known as:  PLAVIX Take 1 tablet (75 mg total) by mouth daily.   FLUoxetine 20 MG tablet Commonly known as:  PROZAC Take 1 tablet (20 mg total) by mouth daily.   furosemide 20 MG tablet Commonly known as:  LASIX Take 1 tablet (20 mg total) by mouth daily as needed for fluid or edema. What changed:  when to take this   isosorbide mononitrate 30 MG 24 hr tablet Commonly known as:  IMDUR Take 2 tablets (60 mg total) by mouth 2 (two) times daily.   metFORMIN 1000 MG tablet Commonly known as:  GLUCOPHAGE Take 1 tablet (1,000 mg total) by mouth 2 (two) times daily with a meal.   mometasone 220 MCG/INH inhaler Commonly known as:  ASMANEX Inhale 2 puffs into the lungs daily.   Nebivolol HCl 20 MG Tabs Take 1 tablet (20 mg total) by mouth daily.   nitroGLYCERIN 0.4 MG SL tablet Commonly known as:  NITROSTAT Place 1 tablet (0.4 mg total) under the tongue every 5 (five) minutes as needed for chest pain.   pantoprazole 40 MG tablet Commonly known as:  PROTONIX Take 1 tablet (40 mg total) by mouth daily.   ranolazine 1000 MG SR tablet Commonly known as:  RANEXA Take 1 tablet (1,000 mg total) by mouth 2 (two) times daily.   traZODone 50 MG tablet Commonly known as:  DESYREL Take 1 tablet (50 mg total) by mouth at bedtime as needed for sleep.   valsartan-hydrochlorothiazide 320-25 MG tablet Commonly known as:  DIOVAN HCT Take 1 tablet by mouth daily.          Outstanding Labs/Studies   BP check at follow up  Duration of Discharge Encounter   Greater than 30 minutes including physician time.  Signed, Cline Crock PA-C 12/17/2016, 9:54 AM

## 2016-12-17 NOTE — Telephone Encounter (Signed)
New message    Pt verbalized that he is having pain at the incision and he wants pain medication called in to the pharmacy

## 2016-12-17 NOTE — Progress Notes (Signed)
[phone] Pt has been walking independently in hall without CP so did not walk with pt. Education completed with pt who voiced understanding. Has been taking plavix and will continue to do so. Has quit smoking for over two years. Discussed carb counting and heart healthy food choices. Reviewed NTG use. Pt stated he does not want to attend CRP 2 as he has a treadmill and wants to use it instead. Will refer to [facility] with comment that pt not interested. Encouraged pt to try to get 30 minutes of ex in most days even if in several sessions. Pt stated he cannot walk long distances due to orthopedic issues. Congratulated him on not smoking. Graylon Good RN BSN 12/17/2016 8:47 AM

## 2016-12-17 NOTE — Telephone Encounter (Addendum)
Discussed with dr Ellyn Hack, script for percocet 5-352 mg one tablet every 8 hours as needed. # 15 with 0 refills. Patient made aware he will need to come to the office to pick up script.

## 2016-12-17 NOTE — Telephone Encounter (Signed)
Spoke with pt, he had a radial cath yesterday and reports swelling at the puncture site. He was given tylenol #3 for the pain but reports it is not helping and his pain level is a #7. He has warm compresses on the area and it is elevated. He is asking for something else for pain. Will discuss with dr harding.

## 2016-12-17 NOTE — Discharge Instructions (Signed)

## 2016-12-19 ENCOUNTER — Telehealth: Payer: Self-pay | Admitting: Physician Assistant

## 2016-12-19 NOTE — Telephone Encounter (Signed)
Patient by the patient, recently underwent cardiac catheterization, three-vessel disease, however medical therapy. After his discharge on 12/17/2016, he started noticing pus draining from his catheter site in his wrist this morning. He says it continues to drain at this time. He denies any obvious bleeding. I am concerned of possible infection, I have recommended him to go to either local urgent care on ED and have it evaluated.

## 2016-12-20 MED FILL — ATORVASTATIN 40 MG TABLET: 40 | 30 days supply | Qty: 30 | Fill #0

## 2016-12-20 MED FILL — CLOPIDOGREL 75 MG TABLET: 75 | 30 days supply | Qty: 30 | Fill #0

## 2016-12-20 MED FILL — traZODone HCL 50 MG TABS: 50 | 30 days supply | Qty: 30 | Fill #0

## 2016-12-20 MED FILL — VALSARTAN-HCTZ 320-25 MG TA: 320-25 | 30 days supply | Qty: 30 | Fill #0

## 2016-12-20 MED FILL — AMLODIPINE BESYLATE 10 MG T: 10 | 30 days supply | Qty: 30 | Fill #0

## 2016-12-20 MED FILL — metFORMIN HCL 1000 MG TABS: 1000 | 30 days supply | Qty: 60 | Fill #0

## 2016-12-23 ENCOUNTER — Encounter: Payer: Self-pay | Admitting: Student

## 2016-12-23 NOTE — Progress Notes (Signed)
Cardiology Office Note    Date:  12/24/2016   ID:  Andrew West, DOB [birth year], MRN [id number]  PCP:  Jaclyn Shaggy, MD  Cardiologist: Dr. Rennis Golden   Chief Complaint  Patient presents with  . Follow-up    Recent cath    History of Present Illness:    Andrew West is a 51 y.o. male with past medical history of CAD (s/p multiple interventions), HTN, HLD, Type 2 DM, COPD, and tobacco use who presents to the office today for hospital follow-up.   Was recently admitted from 1/25 - 12/17/2016 for a cardiac catheterization in the setting of progressive angina. Cath on 1/25 showed 100% occlusion of OM2, 100% occlusion of LCx s/p PCTA (50% residual stenosis and unable to pass stent), 100% occlusion of LPDA, 90% OM3, 80% OM4, 45% mid to distal LAD, and 100% chronic occlusion of the Prox RCA. He was continued on ASA, Plavix, Amlodipine, Atorvastatin, Imdur, Ranexa, and BB therapy. Hydralazine was discontinued in the setting of hypotension and he was instructed to resume Diovan the day following discharge.   In talking with the patient today, he reports continual episodes of chest pain following his recent catheterization. He says the symptoms have slightly improved but still persist. He reports episodes of pain occurring almost daily and lasting for 15-20 minutes at a time. Denies any associated dyspnea, diaphoresis, nausea, or vomiting. The pain can occur at rest or with exertion. Has not had to take any SL nitroglycerin.  He does report having continual pain along his right wrist and forearm. Has not driven or lifted anything heavy since his recent catheterization due to the pain. He reports swelling along his forearm which has persisted but improved over the past few days. He is still diffusely tender to palpation.   Past Medical History:  Diagnosis Date  . Anxiety   . CAD (coronary artery disease)    a. s/p multiple PCIs with last cath 11/2016 with severe multivessel CAD, s/p PCTA to LCx  but unable to pass stent  . Chronic leg pain    bilateral  . Chronic lower back pain   . COPD (chronic obstructive pulmonary disease) (HCC)   . Depression   . GERD (gastroesophageal reflux disease)    Takes Dexilant  . HLD (hyperlipidemia)   . Hypertension   . Rhabdomyolysis    h/o, r/t statins  . Sleep apnea    "can't tolerate mask" (12/16/2016)  . Type II diabetes mellitus (HCC)     Past Surgical History:  Procedure Laterality Date  . BACK SURGERY    . CARDIAC CATHETERIZATION N/A 09/25/2015   Procedure: Left Heart Cath and Coronary Angiography;  Surgeon: Marykay Lex, MD;  Location: [facility] INVASIVE CV LAB;  Service: Cardiovascular;  Laterality: N/A;  . CARDIAC CATHETERIZATION N/A 12/16/2016   Procedure: Left Heart Cath and Coronary Angiography;  Surgeon: Marykay Lex, MD;  Location: [facility] INVASIVE CV LAB;  Service: Cardiovascular;  Laterality: N/A;  . CARDIAC CATHETERIZATION N/A 12/16/2016   Procedure: Coronary Balloon Angioplasty;  Surgeon: Marykay Lex, MD;  Location: [facility] INVASIVE CV LAB;  Service: Cardiovascular;  Laterality: N/A;  . COLONOSCOPY W/ POLYPECTOMY    . CORONARY ANGIOPLASTY  09/25/2015   mid cir & om  . CORONARY ANGIOPLASTY WITH STENT PLACEMENT  10/09/2001   PTCA & stenting of mid AV circumflex; 2.5x67mm Pixel stent  . CORONARY ANGIOPLASTY WITH STENT PLACEMENT  12/13/2001   PCI with stent to mid L circumflex, 95% stenosis to  0% residual  . CORONARY ANGIOPLASTY WITH STENT PLACEMENT  10/10/2003   PCI to mid AV circumflex; LAD 30% disease; RCA 100% occluded prox.  . CORONARY ANGIOPLASTY WITH STENT PLACEMENT  09/01/2011   PCI with stenting with bare metal stent to mid AV groove circumflex and PDA  . CORONARY ANGIOPLASTY WITH STENT PLACEMENT  10/17/2011   cutting balloon angioplasty of ostial lateral OM1 branch and bifurcation AV groove circumflex OM junction; stenosis reduced to 0%  . EXCISIONAL HEMORRHOIDECTOMY    . LEFT HEART CATHETERIZATION WITH CORONARY ANGIOGRAM  N/A 10/18/2011   Procedure: LEFT HEART CATHETERIZATION WITH CORONARY ANGIOGRAM;  Surgeon: Marykay Lex, MD;  Location: [facility] CATH LAB;  Service: Cardiovascular;  Laterality: N/A;  . LUMBAR LAMINECTOMY/DECOMPRESSION MICRODISCECTOMY  03/31/2012   Procedure: LUMBAR LAMINECTOMY/DECOMPRESSION MICRODISCECTOMY 1 LEVEL;  Surgeon: Temple Pacini, MD;  Location: [facility] NEURO ORS;  Service: Neurosurgery;  Laterality: Left;  . TRANSTHORACIC ECHOCARDIOGRAM  07/28/2011   EF 55-65%; LVH, grade 1 diastolic dysfunction;     Current Medications: Outpatient Medications Prior to Visit  Medication Sig Dispense Refill  . acetaminophen (TYLENOL) 500 MG tablet Take 1,000 mg by mouth every 6 (six) hours as needed for headache.    Marland Kitchen acetaminophen-codeine (TYLENOL #3) 300-30 MG tablet Take 1 tablet by mouth every 12 (twelve) hours as needed for moderate pain. 60 tablet 0  . Albuterol Sulfate (PROAIR RESPICLICK) 108 (90 Base) MCG/ACT AEPB Inhale 1 puff into the lungs every 6 (six) hours as needed (shortness of breath). 3 each 1  . amLODipine (NORVASC) 10 MG tablet Take 1 tablet (10 mg total) by mouth daily. 90 tablet 1  . aspirin 81 MG tablet Take 1 tablet (81 mg total) by mouth daily.    Marland Kitchen atorvastatin (LIPITOR) 40 MG tablet Take 1 tablet (40 mg total) by mouth daily. 90 tablet 1  . clopidogrel (PLAVIX) 75 MG tablet Take 1 tablet (75 mg total) by mouth daily. 90 tablet 1  . FLUoxetine (PROZAC) 20 MG tablet Take 1 tablet (20 mg total) by mouth daily. 90 tablet 1  . metFORMIN (GLUCOPHAGE) 1000 MG tablet Take 1 tablet (1,000 mg total) by mouth 2 (two) times daily with a meal. 180 tablet 1  . mometasone (ASMANEX) 220 MCG/INH inhaler Inhale 2 puffs into the lungs daily. 3 Inhaler 1  . Nebivolol HCl 20 MG TABS Take 1 tablet (20 mg total) by mouth daily. 90 tablet 3  . pantoprazole (PROTONIX) 40 MG tablet Take 1 tablet (40 mg total) by mouth daily. 90 tablet 1  . ranolazine (RANEXA) 1000 MG SR tablet Take 1 tablet (1,000 mg total) by  mouth 2 (two) times daily. 180 tablet 3  . traZODone (DESYREL) 50 MG tablet Take 1 tablet (50 mg total) by mouth at bedtime as needed for sleep. 90 tablet 1  . valsartan-hydrochlorothiazide (DIOVAN HCT) 320-25 MG tablet Take 1 tablet by mouth daily. 90 tablet 1  . isosorbide mononitrate (IMDUR) 30 MG 24 hr tablet Take 2 tablets (60 mg total) by mouth 2 (two) times daily. 60 tablet 3  . nitroGLYCERIN (NITROSTAT) 0.4 MG SL tablet Place 1 tablet (0.4 mg total) under the tongue every 5 (five) minutes as needed for chest pain. 25 tablet 11  . oxyCODONE-acetaminophen (PERCOCET/ROXICET) 5-325 MG tablet Take 1 tablet by mouth every 8 (eight) hours as needed for severe pain. 15 tablet 0  . furosemide (LASIX) 20 MG tablet Take 1 tablet (20 mg total) by mouth daily as needed for fluid or edema. (  Patient taking differently: Take 20 mg by mouth 2 (two) times daily after a meal. ) 30 tablet 2   No facility-administered medications prior to visit.      Allergies:   Iohexol   Social History   Social History  . Marital status: Single    Spouse name: N/A  . Number of children: N/A  . Years of education: N/A   Social History Main Topics  . Smoking status: Former Smoker    Packs/day: 0.25    Years: 25.00    Types: Cigarettes    Quit date: 05/24/2015  . Smokeless tobacco: Never Used  . Alcohol use No  . Drug use: No  . Sexual activity: Yes   Other Topics Concern  . None   Social History Narrative  . None     Family History:  The patient's family history includes Diabetes in his mother and sister; Heart attack in his brother and father; Heart disease in his brother; Hypertension in his mother and sister.   Review of Systems:   Please see the history of present illness.     General:  No chills, fever, night sweats or weight changes.  Cardiovascular:  No dyspnea on exertion, edema, orthopnea, palpitations, paroxysmal nocturnal dyspnea. Positive for chest pain.  Dermatological: No rash,  lesions/masses Respiratory: No cough, dyspnea Urologic: No hematuria, dysuria Abdominal:   No nausea, vomiting, diarrhea, bright red blood per rectum, melena, or hematemesis Neurologic:  No visual changes, wkns, changes in mental status. Ext: Positive for right forearm and right wrist pain.  All other systems reviewed and are otherwise negative except as noted above.   Physical Exam:    VS:  BP 109/73   Pulse 73   Ht 5\' 7"  (1.702 m)   Wt 257 lb (116.6 kg)   BMI 40.25 kg/m    General: Well developed, well nourished [place] American male appearing in no acute distress. Head: Normocephalic, atraumatic, sclera non-icteric, no xanthomas, nares are without discharge.  Neck: No carotid bruits. JVD not elevated.  Lungs: Respirations regular and unlabored, without wheezes or rales.  Heart: Regular rate and rhythm. No S3 or S4.  No murmur, no rubs, or gallops appreciated. Abdomen: Soft, non-tender, non-distended with normoactive bowel sounds. No hepatomegaly. No rebound/guarding. No obvious abdominal masses. Msk:  Strength and tone appear normal for age. No joint deformities or effusions. Extremities: No clubbing or cyanosis. No edema.  Distal pedal pulses are 2+ bilaterally. Hematoma along right forearm with diffuse swelling. Tender to palpation along entire forearm, significant scaring present (from childhood according to the patient) which makes examination difficult. Mild localized swelling at radial site. No bruit present.  Neuro: Alert and oriented X 3. Moves all extremities spontaneously. No focal deficits noted. Psych:  Responds to questions appropriately with a normal affect. Skin: No rashes or lesions noted  Wt Readings from Last 3 Encounters:  12/24/16 257 lb (116.6 kg)  12/17/16 261 lb 14.5 oz (118.8 kg)  12/15/16 257 lb 3.2 oz (116.7 kg)     Studies/Labs Reviewed:   EKG:  EKG is ordered today.  The ekg ordered today demonstrates NSR, HR 73, with LVH and no acute ST or T-wave  changes when compared to prior tracings.   Recent Labs: 09/21/2016: ALT 18 12/17/2016: BUN 12; Creatinine, Ser 0.92; Hemoglobin 13.2; Platelets 236; Potassium 3.6; Sodium 140   Lipid Panel    Component Value Date/Time   CHOL 129 09/21/2016 0900   TRIG 155 (H) 09/21/2016 0900   HDL  25 (L) 09/21/2016 0900   CHOLHDL 5.2 (H) 09/21/2016 0900   VLDL 31 (H) 09/21/2016 0900   LDLCALC 73 09/21/2016 0900    Additional studies/ records that were reviewed today include:   Cardiac Catheterization: 12/16/2016  ____CULPRIT LESION________  Maxwell Caul to 2nd Mrg lesion, 100 %stenosed. In-stent stenosis  Mid Cx-1 lesion, 100 %stenosed. In an pre-stent stenosis --> PTCA only performed Post intervention, there is a 50% residual stenosis.  Mid Cx-2 lesion, 100 %stenosed. PTCA only performed Post intervention, there is a 50% residual stenosis.  Ost LPDA to LPDA lesion, 100 %stenosed.  Ost 3rd Mrg to 3rd Mrg lesion, 90 %stenosed.  Ost 4th Mrg to 4th Mrg lesion, 80 %stenosed.  __________________________  Mid LAD to Dist LAD lesion, 45 %stenosed.  Ramus lesion, 100 %stenosed. - In-stent stenosis  Prox RCA lesion, 100 %stenosed. - Chronic  The left ventricular systolic function is normal.  LV end diastolic pressure is normal.  There is no aortic valve stenosis.   Unfortunately the patient had total occlusion of the stented segment in the circumflex that involves the dominant circumflex vessels. The stented OM 2 branch now 100% occluded. Unfortunately, suboptimal minimally effective PTCA was performed. Unable to pass a stent to the desired location. The downstream vessels were extremely diffusely diseased and likely not good targets for bypass, however this could be considered.  Residual diffuse disease was the best that can be done with PTCA. Unable to pass stent. Very rigid lesion wiring high pressure inflation of a noncompliant balloon, but still unable to pass a  stent.  PLAN: Overnight observation and prolonged PTCA procedure. We'll need to maximize medical management per Dr. Rennis Golden   Continued dual antiplatelet therapy  Assessment:    1. CAD S/P multiple PCI's   2. Chest pain, unspecified type   3. Essential hypertension   4. Mixed hyperlipidemia   5. Hematoma of circulatory system after cardiac catheterization      Plan:   In order of problems listed above:  1. CAD/ Chest Pain - repeat cath on 1/25 showed 100% occlusion of OM2, 100% occlusion of LCx s/p PCTA (50% residual stenosis and unable to pass stent), 100% occlusion of LPDA, 90% OM3, 80% OM4, 45% mid to distal LAD, and 100% chronic occlusion of the Prox RCA.  - continues to have episodes of chest pain occurring at rest and with exertion, mildly improved since recent cath. Pain is worse during the day, therefore will increase Imdur to 90mg  AM and 60mg  PM.  - continue ASA, Plavix, statin, Imdur (dose change as above), BB, and Ranexa.  2. Essential HTN - BP at 109/73 during today's visit.  - continue Amlodipine 10mg  daily, Bystolic 20mg  daily, and Valsartan-HCTZ 320-25mg  daily.   3. HLD - Lipid Panel in 08/2016 showed total cholesterol 129, HDL 25, and LDL 73. - continue Atorvastatin 40mg  daily.   4. Hematoma along Right Radial Cath Site -  reports having continual pain along his right wrist and forearm. Has not driven or lifted anything heavy since his recent catheterization due to the pain. Given Oxycodone by Interventional Cardiologist last week which is helping with the pain.  - on examination, he is to palpation along entire forearm, significant scaring present (from childhood according to the patient) which makes examination difficult. Mild localized swelling at radial site. No bruit present. - will obtain an upper extremity doppler to rule out pseudoeneurysm. Patient reports being unable to take Tramadol and no relief with Tylenol. Will provide Oxycodone,  15 tablets with  no refills and schedule doppler study early next week. Was encouraged to go to the ER if swelling significantly worsens in the interim.    Medication Adjustments/Labs and Tests Ordered: Current medicines are reviewed at length with the patient today.  Concerns regarding medicines are outlined above.  Medication changes, Labs and Tests ordered today are listed in the Patient Instructions below. Patient Instructions  Medication Instructions:  INCREASE Imdur to 90mg  in the morning (3 tablets) and 60mg  in the evening (2 tablets).  Labwork: NONE  Testing/Procedures: Your physician has requested that you have a RIGHT upper extremity arterial duplex. This test is an ultrasound of the arteries in the legs or arms. It looks at arterial blood flow in the legs and arms. Allow one hour for Lower and Upper Arterial scans. There are no restrictions or special instructions  Follow-Up: Keep follow up appointment with Dr. Rennis Golden on 2/26 at 8:30AM at [facility] office.  Any Other Special Instructions Will Be Listed Below (If Applicable).  If you need a refill on your cardiac medications before your next appointment, please call your pharmacy.   Signed, Ellsworth Lennox, PA  12/24/2016 1:21 PM    [facility] [address] [place], [address] [place], [place]  [address] Phone: [phone]; Fax: [phone]  [address], [address] [place], [place] [address] Phone: [phone]

## 2016-12-24 ENCOUNTER — Ambulatory Visit (INDEPENDENT_AMBULATORY_CARE_PROVIDER_SITE_OTHER): Payer: BC Managed Care – PPO | Admitting: Student

## 2016-12-24 ENCOUNTER — Encounter: Payer: Self-pay | Admitting: Student

## 2016-12-24 ENCOUNTER — Telehealth: Payer: Self-pay | Admitting: Student

## 2016-12-24 VITALS — BP 109/73 | HR 73 | Ht 67.0 in | Wt 257.0 lb

## 2016-12-24 DIAGNOSIS — I2510 Atherosclerotic heart disease of native coronary artery without angina pectoris: Secondary | ICD-10-CM | POA: Diagnosis not present

## 2016-12-24 DIAGNOSIS — I10 Essential (primary) hypertension: Secondary | ICD-10-CM | POA: Diagnosis not present

## 2016-12-24 DIAGNOSIS — Z9861 Coronary angioplasty status: Secondary | ICD-10-CM

## 2016-12-24 DIAGNOSIS — E782 Mixed hyperlipidemia: Secondary | ICD-10-CM | POA: Diagnosis not present

## 2016-12-24 DIAGNOSIS — R079 Chest pain, unspecified: Secondary | ICD-10-CM | POA: Diagnosis not present

## 2016-12-24 DIAGNOSIS — I97630 Postprocedural hematoma of a circulatory system organ or structure following a cardiac catheterization: Secondary | ICD-10-CM

## 2016-12-24 MED ORDER — NITROGLYCERIN 0.4 MG SL SUBL: 0.4000 mg | SUBLINGUAL_TABLET | SUBLINGUAL | 2 refills | Status: DC | PRN

## 2016-12-24 MED ORDER — ISOSORBIDE MONONITRATE ER 30 MG PO TB24: ORAL_TABLET | ORAL | 3 refills | Status: DC

## 2016-12-24 MED ORDER — OXYCODONE-ACETAMINOPHEN 5-325 MG PO TABS: 1.0000 | ORAL_TABLET | Freq: Three times a day (TID) | ORAL | 0 refills | Status: DC | PRN

## 2016-12-24 NOTE — Telephone Encounter (Signed)
New message       Pt had a missed call.  Calling to see if the nurse called him.  If yes, please call him back

## 2016-12-24 NOTE — Telephone Encounter (Signed)
Deedie called and clarified instructions for upcoming ultrasound, advised to call if questions.

## 2016-12-24 NOTE — Patient Instructions (Signed)
Medication Instructions:  INCREASE Imdur to '90mg'$  in the morning (3 tablets) and '60mg'$  in the evening (2 tablets).  Labwork: NONE  Testing/Procedures: Your physician has requested that you have a RIGHT upper extremity arterial duplex. This test is an ultrasound of the arteries in the legs or arms. It looks at arterial blood flow in the legs and arms. Allow one hour for Lower and Upper Arterial scans. There are no restrictions or special instructions  Follow-Up: Keep follow up appointment with Dr. Debara Pickett on 2/26 at 8:30AM at [facility] office.  Any Other Special Instructions Will Be Listed Below (If Applicable).     If you need a refill on your cardiac medications before your next appointment, please call your pharmacy.

## 2016-12-27 ENCOUNTER — Telehealth: Payer: Self-pay | Admitting: Family Medicine

## 2016-12-27 NOTE — Telephone Encounter (Signed)
Pt called states he has been having cramps in legs and hands,states legs keep getting numb. Pt was informed of walkin.

## 2016-12-29 ENCOUNTER — Ambulatory Visit (HOSPITAL_COMMUNITY)
Admission: RE | Admit: 2016-12-29 | Discharge: 2016-12-29 | Disposition: A | Discharge status: Home / Self Care | Payer: BC Managed Care – PPO | Source: Ambulatory Visit | Attending: Cardiovascular Disease | Admitting: Cardiovascular Disease

## 2016-12-29 DIAGNOSIS — I97630 Postprocedural hematoma of a circulatory system organ or structure following a cardiac catheterization: Secondary | ICD-10-CM | POA: Diagnosis not present

## 2016-12-29 DIAGNOSIS — M79609 Pain in unspecified limb: Secondary | ICD-10-CM | POA: Diagnosis not present

## 2017-01-11 MED FILL — PANTOPRAZOLE SOD DR 40 MG T: 40 | 90 days supply | Qty: 90 | Fill #0

## 2017-01-11 MED FILL — hydrALAZINE HCL 25 MG TABS: 25 | 90 days supply | Qty: 180 | Fill #1

## 2017-01-11 NOTE — Telephone Encounter (Signed)
Patient called to speak with nurse regarding his status, questions that he has about his last appt and his medication. Please follow up.  Thank you.

## 2017-01-17 ENCOUNTER — Ambulatory Visit (INDEPENDENT_AMBULATORY_CARE_PROVIDER_SITE_OTHER): Payer: BC Managed Care – PPO | Admitting: Internal Medicine

## 2017-01-17 ENCOUNTER — Encounter: Payer: Self-pay | Admitting: Internal Medicine

## 2017-01-17 ENCOUNTER — Telehealth: Payer: Self-pay

## 2017-01-17 VITALS — BP 127/87 | HR 78 | Ht 68.0 in | Wt 257.8 lb

## 2017-01-17 DIAGNOSIS — I2510 Atherosclerotic heart disease of native coronary artery without angina pectoris: Secondary | ICD-10-CM | POA: Diagnosis not present

## 2017-01-17 DIAGNOSIS — I10 Essential (primary) hypertension: Secondary | ICD-10-CM

## 2017-01-17 DIAGNOSIS — R0609 Other forms of dyspnea: Secondary | ICD-10-CM

## 2017-01-17 DIAGNOSIS — R079 Chest pain, unspecified: Secondary | ICD-10-CM | POA: Diagnosis not present

## 2017-01-17 DIAGNOSIS — E782 Mixed hyperlipidemia: Secondary | ICD-10-CM

## 2017-01-17 DIAGNOSIS — Z9861 Coronary angioplasty status: Secondary | ICD-10-CM

## 2017-01-17 NOTE — Patient Instructions (Signed)
Your physician wants you to follow-up in: 6 months with Dr. Hilty. You will receive a reminder letter in the mail two months in advance. If you don't receive a letter, please call our office to schedule the follow-up appointment.    

## 2017-01-17 NOTE — Progress Notes (Signed)
OFFICE NOTE  Chief Complaint:  Feels a little better  Primary Care Physician: Arnoldo Morale, MD  HPI:  Andrew West  is a 51 year old gentleman with a history of coronary artery disease and stent placement to the circumflex and obtuse marginal and a history of in stent restenosis to the LAD. There is also a nondominant right coronary artery that is 100% occluded, filled with collaterals. He did have a stress test recently, which showed an EF of 53% and reversible septal ischemia in the setting of chest pain and after much convincing underwent cardiac catheterization.  He then underwent cutting balloon angioplasty to an ostial lateral OM1 branch and the bifurcation AV groove circumflex OM junction. This was successful at reducing the stenosis to 0%. This was in November 2012 and he did not return for followup appointment until April 2013. He was suffering from low back pain and has been evaluated and treated by Dr. Trenton Gammon with a laminectomy/discectomy, which he says was not helpful. Otherwise, he continues to smoke and occasionally complains of shortness of breath and some chest pain symptoms which are atypical. He is on long-acting and/or has not needed to take short-acting nitroglycerin. His other concern today is that he is having problems with his teeth and was recommended to have edentulation and by an oral surgeon Dr. Diona Browner. Finally, he is suffering from significant stress and depression due to recent separation with wife in dealing with his kids. This seems to be a big tablets on his continued smoking.  Mr. Schaumburg returns today in the office. He feels fairly well. He denies any chest pain. He continues to have problems with low back pain and numbness and tingling in his legs. He is apparently status post laminectomy and microdiscectomy by Dr. Trenton Gammon and continues to have symptoms which may be related to that. He's also complaining of what sounds like neuropathic pain. He  is not currently on medication. He is asking for Tylenol 3 for pain today which I told him I did not prescribe. Ultimately there are no further surgical or nonsurgical options, he may need to go on a neuropathic pain medication or perhaps be referred to a pain management specialist. He is also looking for a new primary care provider as his primary care provider is close to retirement. He reports he has cut back his smoking to about 1 pack every 2 weeks. He is also been out of amlodipine and simvastatin although his blood pressure is well controlled.  I saw Mr. Baxendale in the office today. He is reporting now complains of shortness of breath and chest pain. He also feels like his breathing is worse when working around chemicals at work. He uses a respiratory protection mask but also feels like he is under significant stress. He's had missed several days of work and is concerned about his job. Sounds like he is in a difficult work environment. He says that he is apologizing for "deceiving me" that he was not honest about his symptoms. He apparently has been having shortness of breath and chest pain for several months and did not relay that to me.  Mr. Meloche returns to the office today for follow-up. His main complaint is shortness of breath. He's not feeling as much chest pain he's had been previously. We recently did a nuclear stress test which was negative for ischemia and showed an EF of 56%. With regards to shortness of breath he's concerned most of this was related to chemicals at work  although he has not worked in several months. He's had about 20 pound weight gain since we last saw him in the office and denies any lower extremity swelling, orthopnea or PND. Shortness of breath is persistent and is associated with some wheezing. He did see Dr. 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 Shortness of breath seems to be a little bit worse with exertion which makes me wonder whether there is an element of exercise-induced pulmonary hypertension. He's not had an echocardiogram in some time.  Mr. Alberta returns today for follow-up. He was noted to have some diastolic dysfunction on his echocardiogram. I started him on low-dose Lasix and check lab work including a BNP which is very low around 50. On follow-up today he reports he feels no different with the addition of Lasix. I therefore asked him to take it as needed. He is also describing worsening substernal chest discomfort and a squeezing pressure in his chest today. He says this is been getting worse over the past several days, more than his typical chest discomfort. As previously noted he recently underwent a nuclear stress test a few months ago which was negative for ischemia.   Mr. Ciampi returns for follow-up. As mentioned he had had recent progressive chest pain symptoms and worsening shortness of breath. He underwent another cardiac catheterization which demonstrated the following:   Ost 2nd Mrg to 2nd Mrg lesion, 99% stenosed. Post intervention, 99% residual stenosis remained. The lesion was previously treated with a bare metal stentgreater than two years ago.  Mid Cx-2 lesion, 90% stenosed. Post intervention, there is a 20% residual stenosis.  Mid Cx-1 lesion, 80% stenosed. Post intervention, there is a 50% residual stenosis. The lesion was previously treated with a bare metal stent.  Ramus lesion, 100% stenosed. The lesion was previously treated with a bare metal stentgreater than two years ago.  Prox RCA lesion, 100% stenosed.  There is mild left ventricular systolic dysfunction.  Moderately elevated LVEDP of 20 mmHg   Difficult situation with what amounts to be a totally occluded (In-stent re-stenosis) of the OM2 branch with sub-optimal PTCA of the mid AV-Groove In-stent restenosis. He  continues to have chest pain although reports it somewhat improved. He was started on ranolazine and currently is on 1000 mg twice a day.  Mr. Keilman returns today for follow-up. He reports he is doing fairly well on medical therapy. He still gets some sharp chest pain mostly along the right sternal border. He does get short of breath with moderate exertion. He's trying to do some walking on a treadmill but generally stops the exercise once he gets short of breath because he is very nervous. He is not currently working due to combined cardiac pulmonary and musculoskeletal diseases. He says that he and his wife are going to go on a delayed honeymoon since they never took one.  04/20/2016  Mr. Kraska returns today for follow-up. He was recently seen in the hospital in the beginning of May for chest pain. He reports that it's up in the left neck base around the area of the left clavicle. It's very exquisitely tender to touch and feels sharp and electric. This pain is related we think to cervical pain. He ruled out for MI and was not worked up further for coronary ischemia. This pain is felt to be distinctly different. His isosorbide  was increased up to 60 mg twice a day but he notes no change in his symptoms with that. His primary care providers hesitant to provide any pain medication.  06/28/2016  Mr. Linthicum was seen back today in follow-up. He recently was in the ER at [facility] the left without being seen after 3 hours. An EKG was performed and showed no ischemic changes. He reported going home and taking some nitroglycerin and aspirin with some eventual improvement in his symptoms. He continues to have a sharp left chest discomfort which is tender to the touch and is persistent. It's not necessarily worse with exertion or relieved by rest. I felt this may likely be a neuropathic pain and I recommended starting gabapentin 300 mg daily at bedtime. He reports no improvement with this medication. I advised him to  discontinue today. He's also concerned about poor sleep at night, significant preoccupation with death and anxiety. This is concerning for possible PTSD type symptoms.  08/23/2016  Mr. Dufrane returns today for follow-up. I referred him to behavioral health and he says that he went but it was not helpful. The medical record however does not show any evidence that he made that appointment. He did present to the emergency department on 06/29/2016 with depression and anxiety and was felt not to need inpatient treatment. He underwent nuclear stress testing which was negative for ischemia and showed normal LV function. I do not believe is ongoing chest pain at this time is due to ischemia. Blood pressure is elevated today however 142/102. A recheck was 138/89.  11/03/2016  I saw Mr. Panuco today for follow-up again. He is complaining of persistent chest pain which is chronic, tends to be present for most of the day at rest and with exertion. He also complains of shortness of breath. Surprisingly blood pressure is elevated today, much higher than it has been in the past at 150/105. He reports compliance with his medications. This could be contributing to his problems. He is interested in another heart catheterization. I did remind him that we performed a stress test only 3 months ago which was negative for ischemia. His last coronary intervention was about a year ago which she had some percutaneous angioplasty but no new stent placement. He has complex anatomy that was not amenable to PCI, rather medical therapy was recommended however he has been on maximal doses of nitrates, Ranexa, beta blocker and other blood pressure medications. Interestingly, despite a high dose of metoprolol, his heart rate remains up in the 80s. EKG today is nonischemic.  12/02/2016  Mr. Flett returns today for follow-up. He reports despite changing his medications which has resulted in improvement in blood pressure that he still has  significant chest discomfort and shortness of breath. He is certain that there is a new blockage in his heart that the cause of this. He believes he needs another heart catheterization. Symptoms seem similar to his symptoms prior to his last cutting balloon angioplasty more than 2 years ago.  01/17/2017  Mr. Meegan seen today in follow-up. He underwent recent repeat coronary catheterization which showed no clear targets for intervention. He did have some small distal disease which was angioplastied and noted a small improvement in his symptoms. His nitroglycerin was increased which I think is also contributing to his improvement. At this point I think we need to continue with medical therapy. We could also possibly consider adding some pain medication, perhaps a neuropathic pain medicine is her may be a complex regional pain  syndrome or other component of recurrent chest pain beyond ischemia.  PMHx:  Past Medical History:  Diagnosis Date  . Anxiety   . CAD (coronary artery disease)    a. s/p multiple PCIs with last cath 11/2016 with severe multivessel CAD, s/p PCTA to LCx but unable to pass stent  . Chronic leg pain    bilateral  . Chronic lower back pain   . COPD (chronic obstructive pulmonary disease) ([place])   . Depression   . GERD (gastroesophageal reflux disease)    Takes Dexilant  . HLD (hyperlipidemia)   . Hypertension   . Rhabdomyolysis    h/o, r/t statins  . Sleep apnea    "can't tolerate mask" (12/16/2016)  . Type II diabetes mellitus ([place])     Past Surgical History:  Procedure Laterality Date  . BACK SURGERY    . CARDIAC CATHETERIZATION N/A 09/25/2015   Procedure: Left Heart Cath and Coronary Angiography;  Surgeon: Leonie Man, MD;  Location: [place] [facility];  Service: Cardiovascular;  Laterality: N/A;  . CARDIAC CATHETERIZATION N/A 12/16/2016   Procedure: Left Heart Cath and Coronary Angiography;  Surgeon: Leonie Man, MD;  Location: [place] [facility];  Service:  Cardiovascular;  Laterality: N/A;  . CARDIAC CATHETERIZATION N/A 12/16/2016   Procedure: Coronary Balloon Angioplasty;  Surgeon: Leonie Man, MD;  Location: [place] [facility];  Service: Cardiovascular;  Laterality: N/A;  . COLONOSCOPY W/ POLYPECTOMY    . CORONARY ANGIOPLASTY  09/25/2015   mid cir & om  . CORONARY ANGIOPLASTY WITH STENT PLACEMENT  10/09/2001   PTCA & stenting of mid AV circumflex; 2.5x20m Pixel stent  . CORONARY ANGIOPLASTY WITH STENT PLACEMENT  12/13/2001   PCI with stent to mid L circumflex, 95% stenosis to 0% residual  . CORONARY ANGIOPLASTY WITH STENT PLACEMENT  10/10/2003   PCI to mid AV circumflex; LAD 30% disease; RCA 100% occluded prox.  . CORONARY ANGIOPLASTY WITH STENT PLACEMENT  09/01/2011   PCI with stenting with bare metal stent to mid AV groove circumflex and PDA  . CORONARY ANGIOPLASTY WITH STENT PLACEMENT  10/17/2011   cutting balloon angioplasty of ostial lateral OM1 branch and bifurcation AV groove circumflex OM junction; stenosis reduced to 0%  . EXCISIONAL HEMORRHOIDECTOMY    . LEFT HEART CATHETERIZATION WITH CORONARY ANGIOGRAM N/A 10/18/2011   Procedure: LEFT HEART CATHETERIZATION WITH CORONARY ANGIOGRAM;  Surgeon: DLeonie Man MD;  [facility];  Service: Cardiovascular;  Laterality: N/A;  . LUMBAR LAMINECTOMY/DECOMPRESSION MICRODISCECTOMY  03/31/2012   Procedure: LUMBAR LAMINECTOMY/DECOMPRESSION MICRODISCECTOMY 1 LEVEL;  Surgeon: HCharlie Pitter MD;  [facility];  Service: Neurosurgery;  Laterality: Left;  . TRANSTHORACIC ECHOCARDIOGRAM  07/28/2011   EF 55-65%; LVH, grade 1 diastolic dysfunction;     FAMHx:  Family History  Problem Relation Age of Onset  . Heart attack Father   . Hypertension Mother   . Diabetes Mother   . Heart disease Brother     x 3   . Heart attack Brother     deceased  . Hypertension Sister   . Diabetes Sister   . Anesthesia problems Neg Hx   . Hypotension Neg Hx   . Malignant hyperthermia Neg  Hx   . Pseudochol deficiency Neg Hx     SOCHx:   reports that he quit smoking about 19 months ago. His smoking use included Cigarettes. He has a 6.25 pack-year smoking history. He has never used smokeless tobacco. He reports that he  does not drink alcohol or use drugs.  ALLERGIES:  Allergies  Allergen Reactions  . Iohexol     PT. TO BE PREMEDICATED PRIOR TO IV CONTRAST PER DR Kris Hartmann /MMS//12/15/15Desc: PT BECAME SOB AND CHEST TIGHTNESS AFTER CONTRAST INJECTION.  [name],RT-RCT., Onset Date: [id number]     ROS: Pertinent items noted in HPI and remainder of comprehensive ROS otherwise negative.  HOME MEDS: Current Outpatient Prescriptions  Medication Sig Dispense Refill  . acetaminophen (TYLENOL) 500 MG tablet Take 1,000 mg by mouth every 6 (six) hours as needed for headache.    Marland Kitchen acetaminophen-codeine (TYLENOL #3) 300-30 MG tablet Take 1 tablet by mouth every 12 (twelve) hours as needed for moderate pain. 60 tablet 0  . Albuterol Sulfate (PROAIR RESPICLICK) 540 (90 Base) MCG/ACT AEPB Inhale 1 puff into the lungs every 6 (six) hours as needed (shortness of breath). 3 each 1  . aspirin 81 MG tablet Take 1 tablet (81 mg total) by mouth daily.    Marland Kitchen atorvastatin (LIPITOR) 40 MG tablet Take 1 tablet (40 mg total) by mouth daily. 90 tablet 1  . clopidogrel (PLAVIX) 75 MG tablet Take 1 tablet (75 mg total) by mouth daily. 90 tablet 1  . FLUoxetine (PROZAC) 20 MG tablet Take 1 tablet (20 mg total) by mouth daily. 90 tablet 1  . furosemide (LASIX) 20 MG tablet Take 20 mg by mouth 2 (two) times daily.    . isosorbide mononitrate (IMDUR) 30 MG 24 hr tablet Take '90mg'$  (3 tablets) in the morning and '60mg'$  (2 tablets) in the PM 90 tablet 3  . metFORMIN (GLUCOPHAGE) 1000 MG tablet Take 1 tablet (1,000 mg total) by mouth 2 (two) times daily with a meal. 180 tablet 1  . mometasone (ASMANEX) 220 MCG/INH inhaler Inhale 2 puffs into the lungs daily. 3 Inhaler 1  . Nebivolol HCl 20 MG TABS Take 1 tablet  (20 mg total) by mouth daily. 90 tablet 3  . nitroGLYCERIN (NITROSTAT) 0.4 MG SL tablet Place 1 tablet (0.4 mg total) under the tongue every 5 (five) minutes as needed for chest pain. 25 tablet 2  . oxyCODONE-acetaminophen (PERCOCET/ROXICET) 5-325 MG tablet Take 1 tablet by mouth every 8 (eight) hours as needed for severe pain. 15 tablet 0  . pantoprazole (PROTONIX) 40 MG tablet Take 1 tablet (40 mg total) by mouth daily. 90 tablet 1  . ranolazine (RANEXA) 1000 MG SR tablet Take 1 tablet (1,000 mg total) by mouth 2 (two) times daily. 180 tablet 3  . traZODone (DESYREL) 50 MG tablet Take 1 tablet (50 mg total) by mouth at bedtime as needed for sleep. 90 tablet 1  . valsartan-hydrochlorothiazide (DIOVAN HCT) 320-25 MG tablet Take 1 tablet by mouth daily. 90 tablet 1  . amLODipine (NORVASC) 10 MG tablet Take 1 tablet (10 mg total) by mouth daily. 90 tablet 1  . traMADol (ULTRAM) 50 MG tablet Take 1 tablet by mouth as needed.     No current facility-administered medications for this visit.     LABS/IMAGING: No results found for this or any previous visit (from the past 48 hour(s)). No results found.  VITALS: BP 127/87   Pulse 78   Ht '5\' 8"'$  (1.727 m)   Wt 257 lb 12.8 oz (116.9 kg)   BMI 39.20 kg/m   EXAM: Deferred  EKG: Deferred  ASSESSMENT:  1. Chest pain and dyspnea at rest and with exertion - Occluded OM2 at a prior stent - no good  revascularization options - low risk Myoview with no ischemia (07/2016) 2. Coronary artery disease status post PCI and cutting balloon angioplasty over 2 years ago 3. Ongoing tobacco abuse 4. Uncontrolled hypertension 5. Dyslipidemia 6. Neuropathy 7. Persistent low back pain 8. Diabetes type 2 9. Morbid obesity 10. Anxiety/?PTSD  PLAN: 1.   Mr. Meroney feels generally good with his current medical regimen. There was no significant targets for repeat stenting. It's not clear whether these benefited from angioplasty or for how long that  result will last. We'll continue with aggressive medical therapy. Also encouraged continued work on improved diabetes, weight loss and activity. Follow-up in 6 months.  Pixie Casino, MD, [facility] Attending Cardiologist [place] 01/17/2017, 11:17 AM

## 2017-01-17 NOTE — Telephone Encounter (Signed)
Writer returned patient's call who is requesting MD to give him more Tylenol #3 for leg and feet pain.  Patient was prescribed #60 on 12/15/16.  Patient requesting to have the prescription filled at the [facility]. Writer informed him that if MD does agree with refill that a call back would be given to patient.  Patient stated understanding.

## 2017-01-27 MED FILL — VALSARTAN-HCTZ 320-25 MG TA: 320-25 | 30 days supply | Qty: 30 | Fill #1

## 2017-01-27 MED FILL — traZODone HCL 50 MG TABS: 50 | 30 days supply | Qty: 30 | Fill #1

## 2017-01-27 MED FILL — AMLODIPINE BESYLATE 10 MG T: 10 | 30 days supply | Qty: 30 | Fill #1

## 2017-01-27 MED FILL — ATORVASTATIN 40 MG TABLET: 40 | 30 days supply | Qty: 30 | Fill #1

## 2017-01-27 MED FILL — FLUoxetine HCL 20 MG CAPS: 20 | 30 days supply | Qty: 30 | Fill #1

## 2017-01-27 MED FILL — CLOPIDOGREL 75 MG TABLET: 75 | 30 days supply | Qty: 30 | Fill #1

## 2017-01-28 ENCOUNTER — Telehealth: Payer: Self-pay | Admitting: Internal Medicine

## 2017-01-28 NOTE — Telephone Encounter (Signed)
Pt.notified

## 2017-01-28 NOTE — Telephone Encounter (Signed)
Patient calling the office for samples of medication:   1.  What medication and dosage are you requesting samples for? Bystolic  '20mg'$  1xday  2.  Are you currently out of this medication? yes   Pt said prescriptions are two expensive

## 2017-01-28 NOTE — Telephone Encounter (Signed)
Samples at the front desk 

## 2017-02-07 ENCOUNTER — Telehealth: Payer: Self-pay | Admitting: Internal Medicine

## 2017-02-07 NOTE — Telephone Encounter (Signed)
PT  CONTINUES  TO  C/O  CHEST PAIN  HAD  TO  TAKE  NTG  SAT    PT   STATED  AT  LAST  OFFICE   VISIT   WAS  MENTIONED   HE MAY   BENEFIT  FROM  SOME KIND OF PAIN MEDICINE   WILL FORWARD TO  DR HILTY FOR REVIEW .Adonis Housekeeper

## 2017-02-07 NOTE — Telephone Encounter (Signed)
New message    Pt c/o of Chest Pain: STAT if CP now or developed within 24 hours  1. Are you having CP right now? yes  2. Are you experiencing any other symptoms (ex. SOB, nausea, vomiting, sweating)? No  3. How long have you been experiencing CP? Every day for a while. Pt states he does not know how long.  4. Is your CP continuous or coming and going? continuous  5. Have you taken Nitroglycerin? No-pt states he took one Saturday  ?

## 2017-02-08 NOTE — Telephone Encounter (Signed)
We discussed possibly trying a neuropathic pain medicine - I would have to see him in the office and discuss risks, benefits and side effects as well as get lab work if he wants to try it.  Dr. Lemmie Evens

## 2017-02-09 NOTE — Telephone Encounter (Signed)
PT AWARE APPT  MADE FOR  02-18-17 AT  8:00 AM WITH DR HILTY

## 2017-02-18 ENCOUNTER — Ambulatory Visit: Payer: BC Managed Care – PPO | Admitting: Internal Medicine

## 2017-02-21 MED FILL — ISOSORBIDE MN ER 30 MG TAB: 30 | 15 days supply | Qty: 60 | Fill #2

## 2017-02-21 MED FILL — RANEXA ER 1,000 MG TABLET: 1000 | 30 days supply | Qty: 60 | Fill #3

## 2017-02-21 MED FILL — ATORVASTATIN 40 MG TABLET: 40 | 30 days supply | Qty: 30 | Fill #2

## 2017-02-21 MED FILL — AMLODIPINE BESYLATE 10 MG T: 10 | 30 days supply | Qty: 30 | Fill #2

## 2017-02-21 MED FILL — traZODone HCL 50 MG TABS: 50 | 30 days supply | Qty: 30 | Fill #2

## 2017-02-21 MED FILL — FLUoxetine HCL 20 MG CAPS: 20 | 30 days supply | Qty: 30 | Fill #2

## 2017-02-21 MED FILL — CLOPIDOGREL 75 MG TABLET: 75 | 30 days supply | Qty: 30 | Fill #2

## 2017-02-24 ENCOUNTER — Ambulatory Visit: Payer: BC Managed Care – PPO | Attending: Family Medicine | Admitting: Physician Assistant

## 2017-02-24 VITALS — BP 144/90 | HR 78 | Temp 98.1°F | Resp 16 | Wt 263.0 lb

## 2017-02-24 DIAGNOSIS — J069 Acute upper respiratory infection, unspecified: Secondary | ICD-10-CM | POA: Diagnosis not present

## 2017-02-24 DIAGNOSIS — Z7902 Long term (current) use of antithrombotics/antiplatelets: Secondary | ICD-10-CM | POA: Diagnosis not present

## 2017-02-24 DIAGNOSIS — G8929 Other chronic pain: Secondary | ICD-10-CM | POA: Insufficient documentation

## 2017-02-24 DIAGNOSIS — E1100 Type 2 diabetes mellitus with hyperosmolarity without nonketotic hyperglycemic-hyperosmolar coma (NKHHC): Secondary | ICD-10-CM | POA: Insufficient documentation

## 2017-02-24 DIAGNOSIS — Z7984 Long term (current) use of oral hypoglycemic drugs: Secondary | ICD-10-CM | POA: Insufficient documentation

## 2017-02-24 DIAGNOSIS — Z79899 Other long term (current) drug therapy: Secondary | ICD-10-CM | POA: Diagnosis not present

## 2017-02-24 DIAGNOSIS — Z7982 Long term (current) use of aspirin: Secondary | ICD-10-CM | POA: Diagnosis not present

## 2017-02-24 DIAGNOSIS — M5116 Intervertebral disc disorders with radiculopathy, lumbar region: Secondary | ICD-10-CM | POA: Insufficient documentation

## 2017-02-24 DIAGNOSIS — E0800 Diabetes mellitus due to underlying condition with hyperosmolarity without nonketotic hyperglycemic-hyperosmolar coma (NKHHC): Secondary | ICD-10-CM

## 2017-02-24 DIAGNOSIS — R05 Cough: Secondary | ICD-10-CM | POA: Diagnosis present

## 2017-02-24 DIAGNOSIS — M79604 Pain in right leg: Secondary | ICD-10-CM | POA: Insufficient documentation

## 2017-02-24 DIAGNOSIS — M79605 Pain in left leg: Secondary | ICD-10-CM | POA: Diagnosis not present

## 2017-02-24 DIAGNOSIS — I10 Essential (primary) hypertension: Secondary | ICD-10-CM | POA: Diagnosis not present

## 2017-02-24 LAB — GLUCOSE, POCT (MANUAL RESULT ENTRY): POC GLUCOSE: 168 mg/dL — AB (ref 70–99)

## 2017-02-24 LAB — POCT GLYCOSYLATED HEMOGLOBIN (HGB A1C): HEMOGLOBIN A1C: 6.1

## 2017-02-24 MED ORDER — FLUTICASONE PROPIONATE 50 MCG/ACT NA SUSP: 2.0000 | Freq: Every day | NASAL | 6 refills | Status: DC

## 2017-02-24 MED ORDER — BENZONATATE 200 MG PO CAPS: 200.0000 mg | ORAL_CAPSULE | Freq: Two times a day (BID) | ORAL | 0 refills | Status: DC | PRN

## 2017-02-24 MED ORDER — ACETAMINOPHEN-CODEINE #3 300-30 MG PO TABS: 1.0000 | ORAL_TABLET | Freq: Two times a day (BID) | ORAL | 0 refills | Status: DC | PRN

## 2017-02-24 MED ORDER — AZITHROMYCIN 250 MG PO TABS: ORAL_TABLET | ORAL | 0 refills | Status: DC

## 2017-02-24 NOTE — Progress Notes (Signed)
Andrew West, is a 51 y.o. male  QTM:[id number]  GYB:[id number]  DOB - 12-22-1965  Subjective:  Chief Complaint and HPI: Andrew West is a 51 y.o. male here today to for cough and congestion for about 7 days.  He is coughing up some yellow-green mucus.  Not taking any OTC meds.  No f/c.  +sinus congestion and pressure and mild ST.   B leg pain that is chronic and ongoing.  Tylenol # 3 usu helps.  He hasn't had an Rx since January 2018.   Diabetes and blood sugars are doing well.  CBG at home bt 105-140s.  Compliant with meds.  No hyper/hypoglycemia.  BP at home is usu 120-130/80-85.  No HA/CP/SOB/Dizziness  ROS:   Constitutional:  No f/c, No night sweats, No unexplained weight loss. EENT:  No vision changes, No blurry vision, No hearing changes. +ENT s/sx as above.  Respiratory: + cough, No SOB Cardiac: No CP, no palpitations GI:  No abd pain, No N/V/D. GU: No Urinary s/sx Musculoskeletal: Chronic leg and LBP Neuro: No headache, no dizziness, no motor weakness.  Skin: No rash Endocrine:  No polydipsia. No polyuria.  Psych: Denies SI/HI  No problems updated.  ALLERGIES: Allergies  Allergen Reactions  . Iohexol     PT. TO BE PREMEDICATED PRIOR TO IV CONTRAST PER DR Kris Hartmann /MMS//12/15/15Desc: PT BECAME SOB AND CHEST TIGHTNESS AFTER CONTRAST INJECTION.  Audree Bane., Onset Date: [id number]     PAST MEDICAL HISTORY: Past Medical History:  Diagnosis Date  . Anxiety   . CAD (coronary artery disease)    a. s/p multiple PCIs with last cath 11/2016 with severe multivessel CAD, s/p PCTA to LCx but unable to pass stent  . Chronic leg pain    bilateral  . Chronic lower back pain   . COPD (chronic obstructive pulmonary disease) ([place])   . Depression   . GERD (gastroesophageal reflux disease)    Takes Dexilant  . HLD (hyperlipidemia)   . Hypertension   . Rhabdomyolysis    h/o, r/t statins  . Sleep apnea    "can't tolerate mask" (12/16/2016)  . Type II diabetes  mellitus ([place])     MEDICATIONS AT HOME: Prior to Admission medications   Medication Sig Start Date End Date Taking? Authorizing Provider  acetaminophen (TYLENOL) 500 MG tablet Take 1,000 mg by mouth every 6 (six) hours as needed for headache.    Historical Provider, MD  acetaminophen-codeine (TYLENOL #3) 300-30 MG tablet Take 1 tablet by mouth every 12 (twelve) hours as needed for moderate pain. 02/24/17   Argentina Donovan, PA-C  Albuterol Sulfate (PROAIR RESPICLICK) 262 (90 Base) MCG/ACT AEPB Inhale 1 puff into the lungs every 6 (six) hours as needed (shortness of breath). 12/15/16   Arnoldo Morale, MD  amLODipine (NORVASC) 10 MG tablet Take 1 tablet (10 mg total) by mouth daily. 12/15/16 01/14/17  Arnoldo Morale, MD  aspirin 81 MG tablet Take 1 tablet (81 mg total) by mouth daily. 03/23/16   Eileen Stanford, PA-C  atorvastatin (LIPITOR) 40 MG tablet Take 1 tablet (40 mg total) by mouth daily. 12/15/16   Arnoldo Morale, MD  azithromycin (ZITHROMAX) 250 MG tablet Take 2 today then 1 days 2-5 02/24/17   Argentina Donovan, PA-C  benzonatate (TESSALON) 200 MG capsule Take 1 capsule (200 mg total) by mouth 2 (two) times daily as needed for cough. 02/24/17   Argentina Donovan, PA-C  clopidogrel (PLAVIX) 75 MG tablet Take 1 tablet (75 mg total)  mg total) by mouth 2 (two) times daily as needed for cough.  Dispense: 20 capsule; Refill: 0 - fluticasone (FLONASE) 50 MCG/ACT nasal spray; Place 2 sprays into both nostrils daily.  Dispense: 16 g; Refill: 6  4. Pain in both lower extremities Tylenol #3  #60 as directed(last filled 11/2016) see above  5. Essential hypertension Sub optimal control, but also with URI and in pain.  Check BP OOO and record.  We have discussed target BP range and blood pressure goal. I have advised patient to check BP regularly and to call us back or report to clinic if the numbers are consistently higher than 140/90. We discussed the importance of compliance with medical therapy and DASH diet recommended, consequences of uncontrolled hypertension discussed. F/up sooner than 3 months if BP consistenetly higher than 120/80.   Patient have been counseled extensively about nutrition and exercise  Return in about 3 months (around 05/26/2017) for Dr Kirkland Hun for BP, DM, pain, bloodwork.  The patient was given clear instructions to go to ER or return to medical center if symptoms don't improve, worsen or new problems develop. The patient verbalized understanding. The patient was told to call to get lab results if they haven't heard anything in the  next week.     Freeman Caldron, PA-C [facility] and [place], [place]   02/24/2017, 9:01 AMPatient ID: [name], male   DOB: [birth year], 51 y.o.   MRN: [id number]  Andrew West, is a 51 y.o. male  QTM:[id number]  GYB:[id number]  DOB - 12-22-1965  Subjective:  Chief Complaint and HPI: Andrew West is a 51 y.o. male here today to for cough and congestion for about 7 days.  He is coughing up some yellow-green mucus.  Not taking any OTC meds.  No f/c.  +sinus congestion and pressure and mild ST.   B leg pain that is chronic and ongoing.  Tylenol # 3 usu helps.  He hasn't had an Rx since January 2018.   Diabetes and blood sugars are doing well.  CBG at home bt 105-140s.  Compliant with meds.  No hyper/hypoglycemia.  BP at home is usu 120-130/80-85.  No HA/CP/SOB/Dizziness  ROS:   Constitutional:  No f/c, No night sweats, No unexplained weight loss. EENT:  No vision changes, No blurry vision, No hearing changes. +ENT s/sx as above.  Respiratory: + cough, No SOB Cardiac: No CP, no palpitations GI:  No abd pain, No N/V/D. GU: No Urinary s/sx Musculoskeletal: Chronic leg and LBP Neuro: No headache, no dizziness, no motor weakness.  Skin: No rash Endocrine:  No polydipsia. No polyuria.  Psych: Denies SI/HI  No problems updated.  ALLERGIES: Allergies  Allergen Reactions  . Iohexol     PT. TO BE PREMEDICATED PRIOR TO IV CONTRAST PER DR Kris Hartmann /MMS//12/15/15Desc: PT BECAME SOB AND CHEST TIGHTNESS AFTER CONTRAST INJECTION.  Audree Bane., Onset Date: [id number]     PAST MEDICAL HISTORY: Past Medical History:  Diagnosis Date  . Anxiety   . CAD (coronary artery disease)    a. s/p multiple PCIs with last cath 11/2016 with severe multivessel CAD, s/p PCTA to LCx but unable to pass stent  . Chronic leg pain    bilateral  . Chronic lower back pain   . COPD (chronic obstructive pulmonary disease) ([place])   . Depression   . GERD (gastroesophageal reflux disease)    Takes Dexilant  . HLD (hyperlipidemia)   . Hypertension   . Rhabdomyolysis    h/o, r/t statins  . Sleep apnea    "can't tolerate mask" (12/16/2016)  . Type II diabetes  mellitus ([place])     MEDICATIONS AT HOME: Prior to Admission medications   Medication Sig Start Date End Date Taking? Authorizing Provider  acetaminophen (TYLENOL) 500 MG tablet Take 1,000 mg by mouth every 6 (six) hours as needed for headache.    Historical Provider, MD  acetaminophen-codeine (TYLENOL #3) 300-30 MG tablet Take 1 tablet by mouth every 12 (twelve) hours as needed for moderate pain. 02/24/17   Argentina Donovan, PA-C  Albuterol Sulfate (PROAIR RESPICLICK) 262 (90 Base) MCG/ACT AEPB Inhale 1 puff into the lungs every 6 (six) hours as needed (shortness of breath). 12/15/16   Arnoldo Morale, MD  amLODipine (NORVASC) 10 MG tablet Take 1 tablet (10 mg total) by mouth daily. 12/15/16 01/14/17  Arnoldo Morale, MD  aspirin 81 MG tablet Take 1 tablet (81 mg total) by mouth daily. 03/23/16   Eileen Stanford, PA-C  atorvastatin (LIPITOR) 40 MG tablet Take 1 tablet (40 mg total) by mouth daily. 12/15/16   Arnoldo Morale, MD  azithromycin (ZITHROMAX) 250 MG tablet Take 2 today then 1 days 2-5 02/24/17   Argentina Donovan, PA-C  benzonatate (TESSALON) 200 MG capsule Take 1 capsule (200 mg total) by mouth 2 (two) times daily as needed for cough. 02/24/17   Argentina Donovan, PA-C  clopidogrel (PLAVIX) 75 MG tablet Take 1 tablet (75 mg total)

## 2017-02-24 NOTE — Patient Instructions (Signed)
Upper Respiratory Infection, Adult Most upper respiratory infections (URIs) are caused by a virus. A URI affects the nose, throat, and upper air passages. The most common type of URI is often called "the common cold." Follow these instructions at home:  Take medicines only as told by your doctor.  Gargle warm saltwater or take cough drops to comfort your throat as told by your doctor.  Use a warm mist humidifier or inhale steam from a shower to increase air moisture. This may make it easier to breathe.  Drink enough fluid to keep your pee (urine) clear or pale yellow.  Eat soups and other clear broths.  Have a healthy diet.  Rest as needed.  Go back to work when your fever is gone or your doctor says it is okay.  You may need to stay home longer to avoid giving your URI to others.  You can also wear a face mask and wash your hands often to prevent spread of the virus.  Use your inhaler more if you have asthma.  Do not use any tobacco products, including cigarettes, chewing tobacco, or electronic cigarettes. If you need help quitting, ask your doctor. Contact a doctor if:  You are getting worse, not better.  Your symptoms are not helped by medicine.  You have chills.  You are getting more short of breath.  You have brown or red mucus.  You have yellow or brown discharge from your nose.  You have pain in your face, especially when you bend forward.  You have a fever.  You have puffy (swollen) neck glands.  You have pain while swallowing.  You have white areas in the back of your throat. Get help right away if:  You have very bad or constant:  Headache.  Ear pain.  Pain in your forehead, behind your eyes, and over your cheekbones (sinus pain).  Chest pain.  You have long-lasting (chronic) lung disease and any of the following:  Wheezing.  Long-lasting cough.  Coughing up blood.  A change in your usual mucus.  You have a stiff neck.  You have  changes in your:  Vision.  Hearing.  Thinking.  Mood. This information is not intended to replace advice given to you by your health care provider. Make sure you discuss any questions you have with your health care provider. Document Released: 04/26/2008 Document Revised: 07/11/2016 Document Reviewed: 02/13/2014 Elsevier Interactive Patient Education  2017 Elsevier Inc.  

## 2017-03-10 ENCOUNTER — Other Ambulatory Visit: Payer: Self-pay | Admitting: Family Medicine

## 2017-03-15 ENCOUNTER — Encounter: Payer: Self-pay | Admitting: Family Medicine

## 2017-03-15 ENCOUNTER — Ambulatory Visit: Payer: BC Managed Care – PPO | Attending: Family Medicine | Admitting: Family Medicine

## 2017-03-15 VITALS — BP 124/79 | HR 76 | Temp 98.0°F | Ht 69.0 in | Wt 258.6 lb

## 2017-03-15 DIAGNOSIS — G8929 Other chronic pain: Secondary | ICD-10-CM | POA: Diagnosis not present

## 2017-03-15 DIAGNOSIS — J449 Chronic obstructive pulmonary disease, unspecified: Secondary | ICD-10-CM

## 2017-03-15 DIAGNOSIS — Z7984 Long term (current) use of oral hypoglycemic drugs: Secondary | ICD-10-CM | POA: Insufficient documentation

## 2017-03-15 DIAGNOSIS — G4700 Insomnia, unspecified: Secondary | ICD-10-CM | POA: Diagnosis not present

## 2017-03-15 DIAGNOSIS — E0800 Diabetes mellitus due to underlying condition with hyperosmolarity without nonketotic hyperglycemic-hyperosmolar coma (NKHHC): Secondary | ICD-10-CM

## 2017-03-15 DIAGNOSIS — E785 Hyperlipidemia, unspecified: Secondary | ICD-10-CM | POA: Insufficient documentation

## 2017-03-15 DIAGNOSIS — I10 Essential (primary) hypertension: Secondary | ICD-10-CM | POA: Insufficient documentation

## 2017-03-15 DIAGNOSIS — E119 Type 2 diabetes mellitus without complications: Secondary | ICD-10-CM | POA: Insufficient documentation

## 2017-03-15 DIAGNOSIS — Z955 Presence of coronary angioplasty implant and graft: Secondary | ICD-10-CM | POA: Insufficient documentation

## 2017-03-15 DIAGNOSIS — K219 Gastro-esophageal reflux disease without esophagitis: Secondary | ICD-10-CM | POA: Diagnosis not present

## 2017-03-15 DIAGNOSIS — Z79899 Other long term (current) drug therapy: Secondary | ICD-10-CM | POA: Diagnosis not present

## 2017-03-15 DIAGNOSIS — G4709 Other insomnia: Secondary | ICD-10-CM | POA: Diagnosis not present

## 2017-03-15 DIAGNOSIS — J029 Acute pharyngitis, unspecified: Secondary | ICD-10-CM | POA: Diagnosis not present

## 2017-03-15 DIAGNOSIS — Z7982 Long term (current) use of aspirin: Secondary | ICD-10-CM | POA: Diagnosis not present

## 2017-03-15 DIAGNOSIS — M542 Cervicalgia: Secondary | ICD-10-CM | POA: Insufficient documentation

## 2017-03-15 DIAGNOSIS — Z9889 Other specified postprocedural states: Secondary | ICD-10-CM | POA: Diagnosis not present

## 2017-03-15 DIAGNOSIS — Z87891 Personal history of nicotine dependence: Secondary | ICD-10-CM | POA: Diagnosis not present

## 2017-03-15 DIAGNOSIS — I2510 Atherosclerotic heart disease of native coronary artery without angina pectoris: Secondary | ICD-10-CM | POA: Insufficient documentation

## 2017-03-15 DIAGNOSIS — Z79891 Long term (current) use of opiate analgesic: Secondary | ICD-10-CM | POA: Insufficient documentation

## 2017-03-15 LAB — POCT GLYCOSYLATED HEMOGLOBIN (HGB A1C): Hemoglobin A1C: 6.1

## 2017-03-15 LAB — POCT CBG (FASTING - GLUCOSE)-MANUAL ENTRY: GLUCOSE FASTING, POC: 120 mg/dL — AB (ref 70–99)

## 2017-03-15 MED ORDER — CETIRIZINE HCL 10 MG PO TABS: 10.0000 mg | ORAL_TABLET | Freq: Every day | ORAL | 1 refills | Status: DC

## 2017-03-15 MED ORDER — TIOTROPIUM BROMIDE MONOHYDRATE 18 MCG IN CAPS: 18.0000 ug | ORAL_CAPSULE | Freq: Every day | RESPIRATORY_TRACT | 3 refills | Status: DC

## 2017-03-15 MED ORDER — TRAZODONE HCL 50 MG PO TABS: 50.0000 mg | ORAL_TABLET | Freq: Every evening | ORAL | 1 refills | Status: DC | PRN

## 2017-03-15 MED ORDER — AMOXICILLIN-POT CLAVULANATE 875-125 MG PO TABS: 1.0000 | ORAL_TABLET | Freq: Two times a day (BID) | ORAL | 0 refills | Status: DC

## 2017-03-15 MED FILL — SPIRIVA 18 MCG CP-HANDIHALE: 18 | 30 days supply | Qty: 30 | Fill #0

## 2017-03-15 MED FILL — AMOX-CLAV 875-125 MG TABLET: 875-125 | 10 days supply | Qty: 20 | Fill #0

## 2017-03-15 NOTE — Progress Notes (Signed)
Subjective:  Patient ID: Andrew West, male    DOB: 1966-05-16  Age: 51 y.o. MRN: [id number]  CC: Sore Throat; Diabetes; and Leg Pain   HPI Andrew West is a 51 y.o. with a  Medical history of Type 2 DM (A1c 6.1), CAD s/p PCI/stent to LCx and OM1In 09/2015; cutting balloon angioplasty to OM 1, HLD, HTN, previous tobacco abuse (quit 2 years ago) chronic back pain s/p laminectomy/discectomy (3 years ago by Dr Dutch Quint), depression who comes into the clinic for follow-up visit.  Cardiac cath from 11/2016 revealed total occlusion of the stented segment in the LCx,OM2 branch; suboptimal minimally effective PTCA was performed in mid CX-1 and mid CX-2 lesions with 50% residual stenosis post intervention) due to inability to pass a stent to desired location. Maximize medical management.  He complains of shortness of breath on exertion and having to use his rescue inhaler up to 45 times a day. He has been compliant with his Asmanex and denies wheezing.  Complains of one-month history of sore throat, odynophagia and tenderness around his neck. Denies sinus pressure or tenderness, postnasal drip. He has had no fevers, weight loss. Treated for URI 3 weeks ago by the PA completed a course of azithromycin and Flonase.  Continues to have low back pain which radiates to his bilateral lower extremities and is yet to make an appointment with pain management. I had received a tramadol requests refill from the pharmacy but on further questioning the patient declines tramadol.  Past Medical History:  Diagnosis Date  . Anxiety   . CAD (coronary artery disease)    a. s/p multiple PCIs with last cath 11/2016 with severe multivessel CAD, s/p PCTA to LCx but unable to pass stent  . Chronic leg pain    bilateral  . Chronic lower back pain   . COPD (chronic obstructive pulmonary disease) ([facility])   . Depression   . GERD (gastroesophageal reflux disease)    Takes Dexilant  . HLD (hyperlipidemia)   .  Hypertension   . Rhabdomyolysis    h/o, r/t statins  . Sleep apnea    "can't tolerate mask" (12/16/2016)  . Type II diabetes mellitus ([facility])     Past Surgical History:  Procedure Laterality Date  . BACK SURGERY    . CARDIAC CATHETERIZATION N/A 09/25/2015   Procedure: Left Heart Cath and Coronary Angiography;  Surgeon: Marykay Lex, MD;  Location: [facility] INVASIVE CV LAB;  Service: Cardiovascular;  Laterality: N/A;  . CARDIAC CATHETERIZATION N/A 12/16/2016   Procedure: Left Heart Cath and Coronary Angiography;  Surgeon: Marykay Lex, MD;  Location: [facility] INVASIVE CV LAB;  Service: Cardiovascular;  Laterality: N/A;  . CARDIAC CATHETERIZATION N/A 12/16/2016   Procedure: Coronary Balloon Angioplasty;  Surgeon: Marykay Lex, MD;  Location: [facility] INVASIVE CV LAB;  Service: Cardiovascular;  Laterality: N/A;  . COLONOSCOPY W/ POLYPECTOMY    . CORONARY ANGIOPLASTY  09/25/2015   mid cir & om  . CORONARY ANGIOPLASTY WITH STENT PLACEMENT  10/09/2001   PTCA & stenting of mid AV circumflex; 2.5x9mm Pixel stent  . CORONARY ANGIOPLASTY WITH STENT PLACEMENT  12/13/2001   PCI with stent to mid L circumflex, 95% stenosis to 0% residual  . CORONARY ANGIOPLASTY WITH STENT PLACEMENT  10/10/2003   PCI to mid AV circumflex; LAD 30% disease; RCA 100% occluded prox.  . CORONARY ANGIOPLASTY WITH STENT PLACEMENT  09/01/2011   PCI with stenting with bare metal stent to mid AV groove circumflex and PDA  .  CORONARY ANGIOPLASTY WITH STENT PLACEMENT  10/17/2011   cutting balloon angioplasty of ostial lateral OM1 branch and bifurcation AV groove circumflex OM junction; stenosis reduced to 0%  . EXCISIONAL HEMORRHOIDECTOMY    . LEFT HEART CATHETERIZATION WITH CORONARY ANGIOGRAM N/A 10/18/2011   Procedure: LEFT HEART CATHETERIZATION WITH CORONARY ANGIOGRAM;  Surgeon: Marykay Lex, MD;  Location: [facility] CATH LAB;  Service: Cardiovascular;  Laterality: N/A;  . LUMBAR LAMINECTOMY/DECOMPRESSION MICRODISCECTOMY  03/31/2012    Procedure: LUMBAR LAMINECTOMY/DECOMPRESSION MICRODISCECTOMY 1 LEVEL;  Surgeon: Temple Pacini, MD;  Location: [facility] NEURO ORS;  Service: Neurosurgery;  Laterality: Left;  . TRANSTHORACIC ECHOCARDIOGRAM  07/28/2011   EF 55-65%; LVH, grade 1 diastolic dysfunction;     Allergies  Allergen Reactions  . Iohexol     PT. TO BE PREMEDICATED PRIOR TO IV CONTRAST PER DR Eppie Gibson /MMS//12/15/15Desc: PT BECAME SOB AND CHEST TIGHTNESS AFTER CONTRAST INJECTION.  [name],RT-RCT., Onset Date: [id number]      Outpatient Medications Prior to Visit  Medication Sig Dispense Refill  . acetaminophen-codeine (TYLENOL #3) 300-30 MG tablet Take 1 tablet by mouth every 12 (twelve) hours as needed for moderate pain. 60 tablet 0  . Albuterol Sulfate (PROAIR RESPICLICK) 108 (90 Base) MCG/ACT AEPB Inhale 1 puff into the lungs every 6 (six) hours as needed (shortness of breath). 3 each 1  . amLODipine (NORVASC) 10 MG tablet Take 1 tablet (10 mg total) by mouth daily. 90 tablet 1  . aspirin 81 MG tablet Take 1 tablet (81 mg total) by mouth daily.    Marland Kitchen atorvastatin (LIPITOR) 40 MG tablet Take 1 tablet (40 mg total) by mouth daily. 90 tablet 1  . benzonatate (TESSALON) 200 MG capsule Take 1 capsule (200 mg total) by mouth 2 (two) times daily as needed for cough. 20 capsule 0  . clopidogrel (PLAVIX) 75 MG tablet Take 1 tablet (75 mg total) by mouth daily. 90 tablet 1  . FLUoxetine (PROZAC) 20 MG tablet Take 1 tablet (20 mg total) by mouth daily. 90 tablet 1  . fluticasone (FLONASE) 50 MCG/ACT nasal spray Place 2 sprays into both nostrils daily. 16 g 6  . furosemide (LASIX) 20 MG tablet Take 20 mg by mouth 2 (two) times daily.    . isosorbide mononitrate (IMDUR) 30 MG 24 hr tablet Take 90mg  (3 tablets) in the morning and 60mg  (2 tablets) in the PM 90 tablet 3  . metFORMIN (GLUCOPHAGE) 1000 MG tablet Take 1 tablet (1,000 mg total) by mouth 2 (two) times daily with a meal. 180 tablet 1  . mometasone (ASMANEX) 220 MCG/INH inhaler  Inhale 2 puffs into the lungs daily. 3 Inhaler 1  . Nebivolol HCl 20 MG TABS Take 1 tablet (20 mg total) by mouth daily. 90 tablet 3  . pantoprazole (PROTONIX) 40 MG tablet Take 1 tablet (40 mg total) by mouth daily. 90 tablet 1  . ranolazine (RANEXA) 1000 MG SR tablet Take 1 tablet (1,000 mg total) by mouth 2 (two) times daily. 180 tablet 3  . valsartan-hydrochlorothiazide (DIOVAN HCT) 320-25 MG tablet Take 1 tablet by mouth daily. 90 tablet 1  . traZODone (DESYREL) 50 MG tablet Take 1 tablet (50 mg total) by mouth at bedtime as needed for sleep. 90 tablet 1  . acetaminophen (TYLENOL) 500 MG tablet Take 1,000 mg by mouth every 6 (six) hours as needed for headache.    . nitroGLYCERIN (NITROSTAT) 0.4 MG SL tablet Place 1 tablet (0.4 mg total) under the tongue every 5 (five) minutes as  needed for chest pain. (Patient not taking: Reported on 03/15/2017) 25 tablet 2  . traMADol (ULTRAM) 50 MG tablet TAKE 1 TABLET EVERY DAY (Patient not taking: Reported on 03/15/2017) 60 tablet 2  . azithromycin (ZITHROMAX) 250 MG tablet Take 2 today then 1 days 2-5 6 tablet 0   No facility-administered medications prior to visit.     ROS Review of Systems Constitutional: Negative for activity change and appetite change.  HENT: see HPI   Eyes: Negative for visual disturbance.  Respiratory: Negative for cough, chest tightness and shortness of breath.   Cardiovascular: Negative for chest pain and leg swelling.  Gastrointestinal: Negative for abdominal pain, diarrhea, constipation and abdominal distention.  Endocrine: Negative.   Genitourinary: Negative for dysuria.  Musculoskeletal: Positive for back pain. Negative for myalgias and joint swelling.  Skin: Negative for rash.  Allergic/Immunologic: Negative.   Neurological: Positive for weakness and numbness. Negative for light-headedness.  Psychiatric/Behavioral: Positive for suicidal ideation and intent  Objective:  BP 124/79 (BP Location: Right Arm, Patient  Position: Sitting, Cuff Size: Large)   Pulse 76   Temp 98 F (36.7 C) (Oral)   Ht 5\' 9"  (1.753 m)   Wt 258 lb 9.6 oz (117.3 kg)   SpO2 98%   BMI 38.19 kg/m   BP/Weight 03/15/2017 02/24/2017 01/17/2017  Systolic BP 124 144 127  Diastolic BP 79 90 87  Wt. (Lbs) 258.6 263 257.8  BMI 38.19 39.99 39.2      Physical Exam  Constitutional: He is oriented to person, place, and time. He appears well-developed and well-nourished.  HENT:  Mouth/Throat: Oropharynx is clear and moist.  Tenderness on  left otoscopy, TM is normal No tenderness in right otoscopy, TM is normal  Neck:  Severe tenderness to slight palpation of supplemental and submandibular regions bilaterally. No obvious edema  Cardiovascular: Normal rate, normal heart sounds and intact distal pulses.   No murmur heard. Pulmonary/Chest: Effort normal and breath sounds normal. He has no wheezes. He has no rales. He exhibits no tenderness.  Abdominal: Soft. Bowel sounds are normal. He exhibits no distension and no mass. There is no tenderness.  Musculoskeletal: Normal range of motion.  Lymphadenopathy:    He has cervical adenopathy (right anterior cervictender lymphadenopathy).  Neurological: He is alert and oriented to person, place, and time.    Lab Results  Component Value Date   HGBA1C 6.1 03/15/2017    CMP Latest Ref Rng & Units 12/17/2016 12/13/2016 09/21/2016  Glucose 65 - 99 mg/dL 130(Q) 95 91  BUN 6 - 20 mg/dL 12 10 10   Creatinine 0.61 - 1.24 mg/dL 6.57 8.46 9.62  Sodium 135 - 145 mmol/L 140 140 139  Potassium 3.5 - 5.1 mmol/L 3.6 4.4 4.0  Chloride 101 - 111 mmol/L 106 104 101  CO2 22 - 32 mmol/L 26 27 27   Calcium 8.9 - 10.3 mg/dL 9.5(M) 9.2 9.2  Total Protein 6.1 - 8.1 g/dL - - 7.1  Total Bilirubin 0.2 - 1.2 mg/dL - - 0.5  Alkaline Phos 40 - 115 U/L - - 55  AST 10 - 35 U/L - - 15  ALT 9 - 46 U/L - - 18    Lipid Panel     Component Value Date/Time   CHOL 129 09/21/2016 0900   TRIG 155 (H) 09/21/2016 0900     HDL 25 (L) 09/21/2016 0900   CHOLHDL 5.2 (H) 09/21/2016 0900   VLDL 31 (H) 09/21/2016 0900   LDLCALC 73 09/21/2016 0900  Assessment & Plan:   1. Diabetes mellitus due to underlying condition with hyperosmolarity without coma, without long-term current use of insulin (HCC) Controlled with A1c of 6.1 - Glucose (CBG), Fasting - HgB A1c - Microalbumin/Creatinine Ratio, Urine - CMP14+EGFR - Lipid panel  2. Other insomnia Stable - traZODone (DESYREL) 50 MG tablet; Take 1 tablet (50 mg total) by mouth at bedtime as needed for sleep.  Dispense: 90 tablet; Refill: 1  3. Sore throat Placed on Augmentin and Zyrtec Continue Flonase  4. Neck pain Tenderness is out of proportion to pressure applied If symptoms persist will consider imaging  5. COPD Uncontrolled Spiriva added to regimen  Meds ordered this encounter  Medications  . traZODone (DESYREL) 50 MG tablet    Sig: Take 1 tablet (50 mg total) by mouth at bedtime as needed for sleep.    Dispense:  90 tablet    Refill:  1  . amoxicillin-clavulanate (AUGMENTIN) 875-125 MG tablet    Sig: Take 1 tablet by mouth 2 (two) times daily.    Dispense:  20 tablet    Refill:  0    Follow-up: Return in about 3 weeks (around 04/05/2017) for folow up on sore throat and neck pain.   Jaclyn Shaggy MD

## 2017-03-15 NOTE — Progress Notes (Signed)
Med refills- tylenol #3 and trazadone

## 2017-03-16 ENCOUNTER — Telehealth: Payer: Self-pay | Admitting: Internal Medicine

## 2017-03-16 LAB — CMP14+EGFR
ALT: 14 IU/L (ref 0–44)
AST: 13 IU/L (ref 0–40)
Albumin/Globulin Ratio: 1.6 (ref 1.2–2.2)
Albumin: 4.4 g/dL (ref 3.5–5.5)
Alkaline Phosphatase: 62 IU/L (ref 39–117)
BUN/Creatinine Ratio: 14 (ref 9–20)
BUN: 11 mg/dL (ref 6–24)
Bilirubin Total: 0.3 mg/dL (ref 0.0–1.2)
CALCIUM: 9.1 mg/dL (ref 8.7–10.2)
CO2: 25 mmol/L (ref 18–29)
CREATININE: 0.8 mg/dL (ref 0.76–1.27)
Chloride: 102 mmol/L (ref 96–106)
GFR, EST AFRICAN AMERICAN: 120 mL/min/{1.73_m2} (ref >59)
GFR, EST NON AFRICAN AMERICAN: 104 mL/min/{1.73_m2} (ref >59)
GLUCOSE: 98 mg/dL (ref 65–99)
Globulin, Total: 2.8 g/dL (ref 1.5–4.5)
Potassium: 4.1 mmol/L (ref 3.5–5.2)
Sodium: 144 mmol/L (ref 134–144)
TOTAL PROTEIN: 7.2 g/dL (ref 6.0–8.5)

## 2017-03-16 LAB — MICROALBUMIN / CREATININE URINE RATIO
Creatinine, Urine: 199 mg/dL
MICROALB/CREAT RATIO: 4.4 mg/g{creat} (ref 0.0–30.0)
Microalbumin, Urine: 8.7 ug/mL

## 2017-03-16 LAB — LIPID PANEL
Chol/HDL Ratio: 3.7 ratio (ref 0.0–5.0)
Cholesterol, Total: 106 mg/dL (ref 100–199)
HDL: 29 mg/dL — AB (ref >39)
LDL Calculated: 60 mg/dL (ref 0–99)
Triglycerides: 84 mg/dL (ref 0–149)
VLDL CHOLESTEROL CAL: 17 mg/dL (ref 5–40)

## 2017-03-16 NOTE — Telephone Encounter (Signed)
Pt notified-samples at the front desk

## 2017-03-16 NOTE — Telephone Encounter (Signed)
New Message    Patient calling the office for samples of medication:   1.  What medication and dosage are you requesting samples for? Per pt needs beta blocker but doesn't know name of medication  2.  Are you currently out of this medication? Has 2 pills left

## 2017-03-21 ENCOUNTER — Telehealth: Payer: Self-pay | Admitting: Internal Medicine

## 2017-03-21 NOTE — Telephone Encounter (Signed)
Dr. Debara Pickett completed disability parking placard form for patient  -- has a cardiac condition to the extent that the person's functional limitations are classified as Class III or Class IV according to standards set by the [employer]  -- total & permanent disability parking placard  Patient will pick up at appointment on 03/24/17 @ 0920

## 2017-03-23 ENCOUNTER — Telehealth: Payer: Self-pay

## 2017-03-23 NOTE — Telephone Encounter (Signed)
-----   Message from Arnoldo Morale, MD sent at 03/16/2017  1:44 PM EDT ----- Please inform the patient that his kidneys, liver and cholesterol and normal however his HDL (good cholesterol) is low and he will need to exercise more to raise this level.

## 2017-03-23 NOTE — Telephone Encounter (Signed)
Writer called patient with lab results.  Patient stated understanding of these results.  Patient continues to have a sore throat and swollen glands since his last visit with Dr. Jarold Song.  Patient schedule this Friday at 230 to see MD.

## 2017-03-24 ENCOUNTER — Encounter: Payer: Self-pay | Admitting: Internal Medicine

## 2017-03-24 ENCOUNTER — Ambulatory Visit (INDEPENDENT_AMBULATORY_CARE_PROVIDER_SITE_OTHER): Payer: BC Managed Care – PPO | Admitting: Internal Medicine

## 2017-03-24 ENCOUNTER — Telehealth: Payer: Self-pay | Admitting: Internal Medicine

## 2017-03-24 VITALS — BP 120/86 | Wt 251.0 lb

## 2017-03-24 DIAGNOSIS — R079 Chest pain, unspecified: Secondary | ICD-10-CM

## 2017-03-24 DIAGNOSIS — I2510 Atherosclerotic heart disease of native coronary artery without angina pectoris: Secondary | ICD-10-CM | POA: Diagnosis not present

## 2017-03-24 DIAGNOSIS — G9059 Complex regional pain syndrome I of other specified site: Secondary | ICD-10-CM | POA: Diagnosis not present

## 2017-03-24 DIAGNOSIS — Z9861 Coronary angioplasty status: Secondary | ICD-10-CM

## 2017-03-24 DIAGNOSIS — G9050 Complex regional pain syndrome I, unspecified: Secondary | ICD-10-CM | POA: Insufficient documentation

## 2017-03-24 MED ORDER — PREGABALIN 75 MG PO CAPS: 75.0000 mg | ORAL_CAPSULE | Freq: Three times a day (TID) | ORAL | 3 refills | Status: DC

## 2017-03-24 NOTE — Telephone Encounter (Signed)
New message   Pt just called at 4:10 03/24/17 and stated that the doctors office that Andrew West called will not give him any information and that he is confused. He states that [place] has to call that office back and speak with them.

## 2017-03-24 NOTE — Progress Notes (Signed)
OFFICE NOTE  Chief Complaint:  Persistent chest pain  Primary Care Physician: Arnoldo Morale, MD  HPI:  Andrew West  is a 51 year old gentleman with a history of coronary artery disease and stent placement to the circumflex and obtuse marginal and a history of in stent restenosis to the LAD. There is also a nondominant right coronary artery that is 100% occluded, filled with collaterals. He did have a stress test recently, which showed an EF of 53% and reversible septal ischemia in the setting of chest pain and after much convincing underwent cardiac catheterization.  He then underwent cutting balloon angioplasty to an ostial lateral OM1 branch and the bifurcation AV groove circumflex OM junction. This was successful at reducing the stenosis to 0%. This was in November 2012 and he did not return for followup appointment until April 2013. He was suffering from low back pain and has been evaluated and treated by Dr. Trenton Gammon with a laminectomy/discectomy, which he says was not helpful. Otherwise, he continues to smoke and occasionally complains of shortness of breath and some chest pain symptoms which are atypical. He is on long-acting and/or has not needed to take short-acting nitroglycerin. His other concern today is that he is having problems with his teeth and was recommended to have edentulation and by an oral surgeon Dr. Diona Browner. Finally, he is suffering from significant stress and depression due to recent separation with wife in dealing with his kids. This seems to be a big tablets on his continued smoking.  Andrew West returns today in the office. He feels fairly well. He denies any chest pain. He continues to have problems with low back pain and numbness and tingling in his legs. He is apparently status post laminectomy and microdiscectomy by Dr. Trenton Gammon and continues to have symptoms which may be related to that. He's also complaining of what sounds like neuropathic pain. He  is not currently on medication. He is asking for Tylenol 3 for pain today which I told him I did not prescribe. Ultimately there are no further surgical or nonsurgical options, he may need to go on a neuropathic pain medication or perhaps be referred to a pain management specialist. He is also looking for a new primary care provider as his primary care provider is close to retirement. He reports he has cut back his smoking to about 1 pack every 2 weeks. He is also been out of amlodipine and simvastatin although his blood pressure is well controlled.  I saw Andrew West in the office today. He is reporting now complains of shortness of breath and chest pain. He also feels like his breathing is worse when working around chemicals at work. He uses a respiratory protection mask but also feels like he is under significant stress. He's had missed several days of work and is concerned about his job. Sounds like he is in a difficult work environment. He says that he is apologizing for "deceiving me" that he was not honest about his symptoms. He apparently has been having shortness of breath and chest pain for several months and did not relay that to me.  Andrew West returns to the office today for follow-up. His main complaint is shortness of breath. He's not feeling as much chest pain he's had been previously. We recently did a nuclear stress test which was negative for ischemia and showed an EF of 56%. With regards to shortness of breath he's concerned most of this was related to chemicals at work although  he has not worked in several months. He's had about 20 pound weight gain since we last saw him in the office and denies any lower extremity swelling, orthopnea or PND. Shortness of breath is persistent and is associated with some wheezing. He did see Dr. 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 Shortness of breath seems to be a little bit worse with exertion which makes me wonder whether there is an element of exercise-induced pulmonary hypertension. He's not had an echocardiogram in some time.  Andrew West returns today for follow-up. He was noted to have some diastolic dysfunction on his echocardiogram. I started him on low-dose Lasix and check lab work including a BNP which is very low around 50. On follow-up today he reports he feels no different with the addition of Lasix. I therefore asked him to take it as needed. He is also describing worsening substernal chest discomfort and a squeezing pressure in his chest today. He says this is been getting worse over the past several days, more than his typical chest discomfort. As previously noted he recently underwent a nuclear stress test a few months ago which was negative for ischemia.   Andrew West returns for follow-up. As mentioned he had had recent progressive chest pain symptoms and worsening shortness of breath. He underwent another cardiac catheterization which demonstrated the following:   Ost 2nd Mrg to 2nd Mrg lesion, 99% stenosed. Post intervention, 99% residual stenosis remained. The lesion was previously treated with a bare metal stentgreater than two years ago.  Mid Cx-2 lesion, 90% stenosed. Post intervention, there is a 20% residual stenosis.  Mid Cx-1 lesion, 80% stenosed. Post intervention, there is a 50% residual stenosis. The lesion was previously treated with a bare metal stent.  Ramus lesion, 100% stenosed. The lesion was previously treated with a bare metal stentgreater than two years ago.  Prox RCA lesion, 100% stenosed.  There is mild left ventricular systolic dysfunction.  Moderately elevated LVEDP of 20 mmHg   Difficult situation with what amounts to be a totally occluded (In-stent re-stenosis) of the OM2 branch with sub-optimal PTCA of the mid AV-Groove In-stent restenosis. He  continues to have chest pain although reports it somewhat improved. He was started on ranolazine and currently is on 1000 mg twice a day.  Andrew West returns today for follow-up. He reports he is doing fairly well on medical therapy. He still gets some sharp chest pain mostly along the right sternal border. He does get short of breath with moderate exertion. He's trying to do some walking on a treadmill but generally stops the exercise once he gets short of breath because he is very nervous. He is not currently working due to combined cardiac pulmonary and musculoskeletal diseases. He says that he and his wife are going to go on a delayed honeymoon since they never took one.  04/20/2016  Andrew West returns today for follow-up. He was recently seen in the hospital in the beginning of May for chest pain. He reports that it's up in the left neck base around the area of the left clavicle. It's very exquisitely tender to touch and feels sharp and electric. This pain is related we think to cervical pain. He ruled out for MI and was not worked up further for coronary ischemia. This pain is felt to be distinctly different. His isosorbide was  increased up to 60 mg twice a day but he notes no change in his symptoms with that. His primary care providers hesitant to provide any pain medication.  06/28/2016  Andrew West was seen back today in follow-up. He recently was in the ER at [facility] the left without being seen after 3 hours. An EKG was performed and showed no ischemic changes. He reported going home and taking some nitroglycerin and aspirin with some eventual improvement in his symptoms. He continues to have a sharp left chest discomfort which is tender to the touch and is persistent. It's not necessarily worse with exertion or relieved by rest. I felt this may likely be a neuropathic pain and I recommended starting gabapentin 300 mg daily at bedtime. He reports no improvement with this medication. I advised him to  discontinue today. He's also concerned about poor sleep at night, significant preoccupation with death and anxiety. This is concerning for possible PTSD type symptoms.  08/23/2016  Andrew West returns today for follow-up. I referred him to behavioral health and he says that he went but it was not helpful. The medical record however does not show any evidence that he made that appointment. He did present to the emergency department on 06/29/2016 with depression and anxiety and was felt not to need inpatient treatment. He underwent nuclear stress testing which was negative for ischemia and showed normal LV function. I do not believe is ongoing chest pain at this time is due to ischemia. Blood pressure is elevated today however 142/102. A recheck was 138/89.  11/03/2016  I saw Andrew West today for follow-up again. He is complaining of persistent chest pain which is chronic, tends to be present for most of the day at rest and with exertion. He also complains of shortness of breath. Surprisingly blood pressure is elevated today, much higher than it has been in the past at 150/105. He reports compliance with his medications. This could be contributing to his problems. He is interested in another heart catheterization. I did remind him that we performed a stress test only 3 months ago which was negative for ischemia. His last coronary intervention was about a year ago which she had some percutaneous angioplasty but no new stent placement. He has complex anatomy that was not amenable to PCI, rather medical therapy was recommended however he has been on maximal doses of nitrates, Ranexa, beta blocker and other blood pressure medications. Interestingly, despite a high dose of metoprolol, his heart rate remains up in the 80s. EKG today is nonischemic.  12/02/2016  Andrew West returns today for follow-up. He reports despite changing his medications which has resulted in improvement in blood pressure that he still has  significant chest discomfort and shortness of breath. He is certain that there is a new blockage in his heart that the cause of this. He believes he needs another heart catheterization. Symptoms seem similar to his symptoms prior to his last cutting balloon angioplasty more than 2 years ago.  01/17/2017  Andrew West seen today in follow-up. He underwent recent repeat coronary catheterization which showed no clear targets for intervention. He did have some small distal disease which was angioplastied and noted a small improvement in his symptoms. His nitroglycerin was increased which I think is also contributing to his improvement. At this point I think we need to continue with medical therapy. We could also possibly consider adding some pain medication, perhaps a neuropathic pain medicine is her may be a complex regional pain syndrome  or other component of recurrent chest pain beyond ischemia.  03/24/2017  Andrew West seen today in follow-up. He continues to have pain in the chest. I don't believe it's anginal. I suspect he might have a complex regional pain syndrome. He is taken gabapentin in the past without much success but does not take it regularly. He may benefit from stronger neuropathic pain medicine such as pregabalin. I discussed with the pharmacist today and we ran an interaction check with his current medications. Potential interaction his Prozac with a slight increased risk of serotonin syndrome. I advised him to monitor that and look for symptoms such as fever or muscle rigidity.  PMHx:  Past Medical History:  Diagnosis Date  . Anxiety   . CAD (coronary artery disease)    a. s/p multiple PCIs with last cath 11/2016 with severe multivessel CAD, s/p PCTA to LCx but unable to pass stent  . Chronic leg pain    bilateral  . Chronic lower back pain   . COPD (chronic obstructive pulmonary disease) ([place])   . Depression   . GERD (gastroesophageal reflux disease)    Takes Dexilant  . HLD  (hyperlipidemia)   . Hypertension   . Rhabdomyolysis    h/o, r/t statins  . Sleep apnea    "can't tolerate mask" (12/16/2016)  . Type II diabetes mellitus ([place])     Past Surgical History:  Procedure Laterality Date  . BACK SURGERY    . CARDIAC CATHETERIZATION N/A 09/25/2015   Procedure: Left Heart Cath and Coronary Angiography;  Surgeon: Leonie Man, MD;  Location: [place] [facility];  Service: Cardiovascular;  Laterality: N/A;  . CARDIAC CATHETERIZATION N/A 12/16/2016   Procedure: Left Heart Cath and Coronary Angiography;  Surgeon: Leonie Man, MD;  Location: [place] [facility];  Service: Cardiovascular;  Laterality: N/A;  . CARDIAC CATHETERIZATION N/A 12/16/2016   Procedure: Coronary Balloon Angioplasty;  Surgeon: Leonie Man, MD;  Location: [facility];  Service: Cardiovascular;  Laterality: N/A;  . COLONOSCOPY W/ POLYPECTOMY    . CORONARY ANGIOPLASTY  09/25/2015   mid cir & om  . CORONARY ANGIOPLASTY WITH STENT PLACEMENT  10/09/2001   PTCA & stenting of mid AV circumflex; 2.5x82m Pixel stent  . CORONARY ANGIOPLASTY WITH STENT PLACEMENT  12/13/2001   PCI with stent to mid L circumflex, 95% stenosis to 0% residual  . CORONARY ANGIOPLASTY WITH STENT PLACEMENT  10/10/2003   PCI to mid AV circumflex; LAD 30% disease; RCA 100% occluded prox.  . CORONARY ANGIOPLASTY WITH STENT PLACEMENT  09/01/2011   PCI with stenting with bare metal stent to mid AV groove circumflex and PDA  . CORONARY ANGIOPLASTY WITH STENT PLACEMENT  10/17/2011   cutting balloon angioplasty of ostial lateral OM1 branch and bifurcation AV groove circumflex OM junction; stenosis reduced to 0%  . EXCISIONAL HEMORRHOIDECTOMY    . LEFT HEART CATHETERIZATION WITH CORONARY ANGIOGRAM N/A 10/18/2011   Procedure: LEFT HEART CATHETERIZATION WITH CORONARY ANGIOGRAM;  Surgeon: DLeonie Man MD;  [facility];  Service: Cardiovascular;  Laterality: N/A;  . LUMBAR LAMINECTOMY/DECOMPRESSION  MICRODISCECTOMY  03/31/2012   Procedure: LUMBAR LAMINECTOMY/DECOMPRESSION MICRODISCECTOMY 1 LEVEL;  Surgeon: HCharlie Pitter MD;  [facility];  Service: Neurosurgery;  Laterality: Left;  . TRANSTHORACIC ECHOCARDIOGRAM  07/28/2011   EF 55-65%; LVH, grade 1 diastolic dysfunction;     FAMHx:  Family History  Problem Relation Age of Onset  . Heart attack Father   . Hypertension Mother   .  Diabetes Mother   . Heart disease Brother     x 3   . Heart attack Brother     deceased  . Hypertension Sister   . Diabetes Sister   . Anesthesia problems Neg Hx   . Hypotension Neg Hx   . Malignant hyperthermia Neg Hx   . Pseudochol deficiency Neg Hx     SOCHx:   reports that he quit smoking about 22 months ago. His smoking use included Cigarettes. He has a 6.25 pack-year smoking history. He has never used smokeless tobacco. He reports that he does not drink alcohol or use drugs.  ALLERGIES:  Allergies  Allergen Reactions  . Iohexol Anaphylaxis    PT. TO BE PREMEDICATED PRIOR TO IV CONTRAST PER DR Kris Hartmann /MMS//12/15/15Desc: PT BECAME SOB AND CHEST TIGHTNESS AFTER CONTRAST INJECTION.  [name],RT-RCT., Onset Date: [id number]     ROS: Pertinent items noted in HPI and remainder of comprehensive ROS otherwise negative.  HOME MEDS: Current Outpatient Prescriptions  Medication Sig Dispense Refill  . acetaminophen (TYLENOL) 500 MG tablet Take 1,000 mg by mouth every 6 (six) hours as needed for headache.    Marland Kitchen acetaminophen-codeine (TYLENOL #3) 300-30 MG tablet Take 1 tablet by mouth every 12 (twelve) hours as needed for moderate pain. 60 tablet 0  . Albuterol Sulfate (PROAIR RESPICLICK) 099 (90 Base) MCG/ACT AEPB Inhale 1 puff into the lungs every 6 (six) hours as needed (shortness of breath). 3 each 1  . amoxicillin-clavulanate (AUGMENTIN) 875-125 MG tablet Take 1 tablet by mouth 2 (two) times daily. 20 tablet 0  . aspirin 81 MG tablet Take 1 tablet (81 mg total) by mouth daily.    Marland Kitchen  atorvastatin (LIPITOR) 40 MG tablet Take 1 tablet (40 mg total) by mouth daily. 90 tablet 1  . benzonatate (TESSALON) 200 MG capsule Take 1 capsule (200 mg total) by mouth 2 (two) times daily as needed for cough. 20 capsule 0  . cetirizine (ZYRTEC) 10 MG tablet Take 1 tablet (10 mg total) by mouth daily. 30 tablet 1  . clopidogrel (PLAVIX) 75 MG tablet Take 1 tablet (75 mg total) by mouth daily. 90 tablet 1  . FLUoxetine (PROZAC) 20 MG tablet Take 1 tablet (20 mg total) by mouth daily. 90 tablet 1  . fluticasone (FLONASE) 50 MCG/ACT nasal spray Place 2 sprays into both nostrils daily. 16 g 6  . furosemide (LASIX) 20 MG tablet Take 20 mg by mouth 2 (two) times daily.    . isosorbide mononitrate (IMDUR) 30 MG 24 hr tablet Take '90mg'$  (3 tablets) in the morning and '60mg'$  (2 tablets) in the PM 90 tablet 3  . metFORMIN (GLUCOPHAGE) 1000 MG tablet Take 1 tablet (1,000 mg total) by mouth 2 (two) times daily with a meal. 180 tablet 1  . mometasone (ASMANEX) 220 MCG/INH inhaler Inhale 2 puffs into the lungs daily. 3 Inhaler 1  . Nebivolol HCl 20 MG TABS Take 1 tablet (20 mg total) by mouth daily. 90 tablet 3  . nitroGLYCERIN (NITROSTAT) 0.4 MG SL tablet Place 1 tablet (0.4 mg total) under the tongue every 5 (five) minutes as needed for chest pain. 25 tablet 2  . pantoprazole (PROTONIX) 40 MG tablet Take 1 tablet (40 mg total) by mouth daily. 90 tablet 1  . ranolazine (RANEXA) 1000 MG SR tablet Take 1 tablet (1,000 mg total) by mouth 2 (two) times daily. 180 tablet 3  . tiotropium (SPIRIVA HANDIHALER) 18 MCG inhalation capsule Place 1 capsule (18 mcg total)  into inhaler and inhale daily. 30 capsule 3  . traMADol (ULTRAM) 50 MG tablet TAKE 1 TABLET EVERY DAY 60 tablet 2  . traZODone (DESYREL) 50 MG tablet Take 1 tablet (50 mg total) by mouth at bedtime as needed for sleep. 90 tablet 1  . valsartan-hydrochlorothiazide (DIOVAN HCT) 320-25 MG tablet Take 1 tablet by mouth daily. 90 tablet 1  . amLODipine (NORVASC)  10 MG tablet Take 1 tablet (10 mg total) by mouth daily. 90 tablet 1   No current facility-administered medications for this visit.     LABS/IMAGING: No results found for this or any previous visit (from the past 48 hour(s)). No results found.  VITALS: BP 120/86 (BP Location: Left Arm)   Wt 251 lb (113.9 kg)   BMI 37.07 kg/m   EXAM: General appearance: alert and no distress Lungs: clear to auscultation bilaterally Heart: regular rate and rhythm, S1, S2 normal, no murmur, click, rub or gallop Extremities: extremities normal, atraumatic, no cyanosis or edema Neurologic: Grossly normal  EKG: Deferred  ASSESSMENT: 1. Chest pain and dyspnea at rest and with exertion - Occluded OM2 at a prior stent - no good       revascularization options - low risk Myoview with no ischemia (07/2016) 2. Coronary artery disease status post PCI and cutting balloon angioplasty over 2 years ago 3. Ongoing tobacco abuse 4. Uncontrolled hypertension 5. Dyslipidemia 6. 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 Persistent low back pain 8. Diabetes type 2 9. Morbid obesity 10. Anxiety/?PTSD  PLAN: 1.   Andrew West may have a neuropathic type pain in the chest. I like to try him on pregabalin 75 mg 3 times a day. He is currently on gabapentin but he takes it every other day. I advised him discontinue that. Again we discussed the possibility of serotonin syndrome which is very unlikely however he knows what to look for and discontinue the medicine if it presents. He should contact us if he is not improving on the medication.  Pixie Casino, MD, [facility] Attending Cardiologist [place] 03/24/2017, 9:40 AM

## 2017-03-24 NOTE — Telephone Encounter (Signed)
New message    Pt calling and staes that the doctors office that Andrew West called will not give him any information and that they told him Andrew West needs to call back up there. He is confused on the process and requests a call back to both him and the doctors office that was called earlier.

## 2017-03-24 NOTE — Telephone Encounter (Signed)
--  Patient states that he contacted [facility] to make a new patient appointment. He states he was told that Dr. Debara Pickett would need to refer him to this practice. Advised patient that this is not common practice but we would look in to this.   --[place] and since patient has established with [place] (CMW), he will need to contact Tamela Oddi, Marketing executive at [facility], to explain why he wishes to make this change. He has multiple upcoming appointments at [facility].  --LMTCB

## 2017-03-24 NOTE — Patient Instructions (Addendum)
Your physician has recommended you make the following change in your medication:  -- START pregabalin (Lyrica) '75mg'$  three times daily -- STOP gabapentin  Your physician wants you to follow-up in: 6 months with Dr. Debara Pickett. You will receive a reminder letter in the mail two months in advance. If you don't receive a letter, please call our office to schedule the follow-up appointment.  [facility] [address], [place] [address]  Get Driving Directions  Main: [phone]  Fax: [phone]   3 weeks of Bystolic '10mg'$  samples - with instructions to take 2 tablets daily = '20mg'$  were provided to patient.

## 2017-03-25 ENCOUNTER — Encounter: Payer: Self-pay | Admitting: Family Medicine

## 2017-03-25 ENCOUNTER — Ambulatory Visit: Payer: BC Managed Care – PPO | Attending: Family Medicine | Admitting: Family Medicine

## 2017-03-25 VITALS — BP 123/79 | HR 67 | Temp 98.3°F | Resp 16 | Ht 68.0 in | Wt 252.6 lb

## 2017-03-25 DIAGNOSIS — Z7982 Long term (current) use of aspirin: Secondary | ICD-10-CM | POA: Insufficient documentation

## 2017-03-25 DIAGNOSIS — K219 Gastro-esophageal reflux disease without esophagitis: Secondary | ICD-10-CM | POA: Diagnosis not present

## 2017-03-25 DIAGNOSIS — E119 Type 2 diabetes mellitus without complications: Secondary | ICD-10-CM | POA: Insufficient documentation

## 2017-03-25 DIAGNOSIS — G8929 Other chronic pain: Secondary | ICD-10-CM | POA: Insufficient documentation

## 2017-03-25 DIAGNOSIS — Z7984 Long term (current) use of oral hypoglycemic drugs: Secondary | ICD-10-CM | POA: Insufficient documentation

## 2017-03-25 DIAGNOSIS — I2510 Atherosclerotic heart disease of native coronary artery without angina pectoris: Secondary | ICD-10-CM | POA: Diagnosis not present

## 2017-03-25 DIAGNOSIS — M5116 Intervertebral disc disorders with radiculopathy, lumbar region: Secondary | ICD-10-CM | POA: Insufficient documentation

## 2017-03-25 DIAGNOSIS — F419 Anxiety disorder, unspecified: Secondary | ICD-10-CM | POA: Insufficient documentation

## 2017-03-25 DIAGNOSIS — J449 Chronic obstructive pulmonary disease, unspecified: Secondary | ICD-10-CM | POA: Diagnosis not present

## 2017-03-25 DIAGNOSIS — Z955 Presence of coronary angioplasty implant and graft: Secondary | ICD-10-CM | POA: Diagnosis not present

## 2017-03-25 DIAGNOSIS — F329 Major depressive disorder, single episode, unspecified: Secondary | ICD-10-CM | POA: Insufficient documentation

## 2017-03-25 DIAGNOSIS — J029 Acute pharyngitis, unspecified: Secondary | ICD-10-CM | POA: Diagnosis not present

## 2017-03-25 DIAGNOSIS — G4730 Sleep apnea, unspecified: Secondary | ICD-10-CM | POA: Diagnosis not present

## 2017-03-25 DIAGNOSIS — I10 Essential (primary) hypertension: Secondary | ICD-10-CM | POA: Insufficient documentation

## 2017-03-25 DIAGNOSIS — E785 Hyperlipidemia, unspecified: Secondary | ICD-10-CM | POA: Insufficient documentation

## 2017-03-25 DIAGNOSIS — Z87891 Personal history of nicotine dependence: Secondary | ICD-10-CM | POA: Diagnosis not present

## 2017-03-25 MED ORDER — ACETAMINOPHEN-CODEINE #3 300-30 MG PO TABS: 1.0000 | ORAL_TABLET | Freq: Two times a day (BID) | ORAL | 1 refills | Status: DC | PRN

## 2017-03-25 NOTE — Progress Notes (Signed)
Subjective:  Patient ID: Andrew West, male    DOB: 09/26/1966  Age: 51 y.o. MRN: [id number]  CC: Sore Throat (Follow up - no better)   HPI Andrew West  is a 51 y.o. with a  Medical history of Type 2 DM (A1c 6.1), CAD s/p PCI/stent to LCx and OM1In 09/2015; cutting balloon angioplasty to OM 1, HLD, HTN, previous tobacco abuse (quit 2 years ago) chronic back pain s/p laminectomy/discectomy (3 years ago by Dr Dutch Quint), depression who comes into the clinic for follow-up visit.  Cardiac cath from 11/2016 revealed total occlusion of the stented segment in the LCx,OM2 branch; suboptimal minimally effective PTCA was performed in mid CX-1 and mid CX-2 lesions with 50% residual stenosis post intervention) due to inability to pass a stent to desired location. Recommendation was to maximize medical management.  He had an appointment with his cardiologist-Dr. Rennis Golden yesterday and was commenced on Lyrica for possible neurological component to his chest pain.  He received Augmentin for sore throat which he had complained of at his last visit, one week ago however he reports no improvement in symptoms and has trouble swallowing. He feels like he has a lump in his throat. Informs me that he attempted to swallow on one occasion and started choking on his saliva He is a previous smoker and denies any weight loss or voice change and he denies sinus symptoms.  Past Medical History:  Diagnosis Date  . Anxiety   . CAD (coronary artery disease)    a. s/p multiple PCIs with last cath 11/2016 with severe multivessel CAD, s/p PCTA to LCx but unable to pass stent  . Chronic leg pain    bilateral  . Chronic lower back pain   . COPD (chronic obstructive pulmonary disease) (HCC)   . Depression   . GERD (gastroesophageal reflux disease)    Takes Dexilant  . HLD (hyperlipidemia)   . Hypertension   . Rhabdomyolysis    h/o, r/t statins  . Sleep apnea    "can't tolerate mask" (12/16/2016)  . Type II diabetes  mellitus (HCC)     Past Surgical History:  Procedure Laterality Date  . BACK SURGERY    . CARDIAC CATHETERIZATION N/A 09/25/2015   Procedure: Left Heart Cath and Coronary Angiography;  Surgeon: Marykay Lex, MD;  Location: [facility] INVASIVE CV LAB;  Service: Cardiovascular;  Laterality: N/A;  . CARDIAC CATHETERIZATION N/A 12/16/2016   Procedure: Left Heart Cath and Coronary Angiography;  Surgeon: Marykay Lex, MD;  Location: [facility] INVASIVE CV LAB;  Service: Cardiovascular;  Laterality: N/A;  . CARDIAC CATHETERIZATION N/A 12/16/2016   Procedure: Coronary Balloon Angioplasty;  Surgeon: Marykay Lex, MD;  Location: [facility] INVASIVE CV LAB;  Service: Cardiovascular;  Laterality: N/A;  . COLONOSCOPY W/ POLYPECTOMY    . CORONARY ANGIOPLASTY  09/25/2015   mid cir & om  . CORONARY ANGIOPLASTY WITH STENT PLACEMENT  10/09/2001   PTCA & stenting of mid AV circumflex; 2.5x18mm Pixel stent  . CORONARY ANGIOPLASTY WITH STENT PLACEMENT  12/13/2001   PCI with stent to mid L circumflex, 95% stenosis to 0% residual  . CORONARY ANGIOPLASTY WITH STENT PLACEMENT  10/10/2003   PCI to mid AV circumflex; LAD 30% disease; RCA 100% occluded prox.  . CORONARY ANGIOPLASTY WITH STENT PLACEMENT  09/01/2011   PCI with stenting with bare metal stent to mid AV groove circumflex and PDA  . CORONARY ANGIOPLASTY WITH STENT PLACEMENT  10/17/2011   cutting balloon angioplasty of ostial  lateral OM1 branch and bifurcation AV groove circumflex OM junction; stenosis reduced to 0%  . EXCISIONAL HEMORRHOIDECTOMY    . LEFT HEART CATHETERIZATION WITH CORONARY ANGIOGRAM N/A 10/18/2011   Procedure: LEFT HEART CATHETERIZATION WITH CORONARY ANGIOGRAM;  Surgeon: Marykay Lex, MD;  Location: [facility] CATH LAB;  Service: Cardiovascular;  Laterality: N/A;  . LUMBAR LAMINECTOMY/DECOMPRESSION MICRODISCECTOMY  03/31/2012   Procedure: LUMBAR LAMINECTOMY/DECOMPRESSION MICRODISCECTOMY 1 LEVEL;  Surgeon: Temple Pacini, MD;  Location: [facility] NEURO ORS;  Service:  Neurosurgery;  Laterality: Left;  . TRANSTHORACIC ECHOCARDIOGRAM  07/28/2011   EF 55-65%; LVH, grade 1 diastolic dysfunction;      Outpatient Medications Prior to Visit  Medication Sig Dispense Refill  . acetaminophen (TYLENOL) 500 MG tablet Take 1,000 mg by mouth every 6 (six) hours as needed for headache.    . Albuterol Sulfate (PROAIR RESPICLICK) 108 (90 Base) MCG/ACT AEPB Inhale 1 puff into the lungs every 6 (six) hours as needed (shortness of breath). 3 each 1  . aspirin 81 MG tablet Take 1 tablet (81 mg total) by mouth daily.    Marland Kitchen atorvastatin (LIPITOR) 40 MG tablet Take 1 tablet (40 mg total) by mouth daily. 90 tablet 1  . benzonatate (TESSALON) 200 MG capsule Take 1 capsule (200 mg total) by mouth 2 (two) times daily as needed for cough. 20 capsule 0  . cetirizine (ZYRTEC) 10 MG tablet Take 1 tablet (10 mg total) by mouth daily. 30 tablet 1  . clopidogrel (PLAVIX) 75 MG tablet Take 1 tablet (75 mg total) by mouth daily. 90 tablet 1  . FLUoxetine (PROZAC) 20 MG tablet Take 1 tablet (20 mg total) by mouth daily. 90 tablet 1  . fluticasone (FLONASE) 50 MCG/ACT nasal spray Place 2 sprays into both nostrils daily. 16 g 6  . furosemide (LASIX) 20 MG tablet Take 20 mg by mouth 2 (two) times daily.    . isosorbide mononitrate (IMDUR) 30 MG 24 hr tablet Take 90mg  (3 tablets) in the morning and 60mg  (2 tablets) in the PM 90 tablet 3  . metFORMIN (GLUCOPHAGE) 1000 MG tablet Take 1 tablet (1,000 mg total) by mouth 2 (two) times daily with a meal. 180 tablet 1  . mometasone (ASMANEX) 220 MCG/INH inhaler Inhale 2 puffs into the lungs daily. 3 Inhaler 1  . Nebivolol HCl 20 MG TABS Take 1 tablet (20 mg total) by mouth daily. 90 tablet 3  . nitroGLYCERIN (NITROSTAT) 0.4 MG SL tablet Place 1 tablet (0.4 mg total) under the tongue every 5 (five) minutes as needed for chest pain. 25 tablet 2  . pantoprazole (PROTONIX) 40 MG tablet Take 1 tablet (40 mg total) by mouth daily. 90 tablet 1  . pregabalin  (LYRICA) 75 MG capsule Take 1 capsule (75 mg total) by mouth 3 (three) times daily. 270 capsule 3  . ranolazine (RANEXA) 1000 MG SR tablet Take 1 tablet (1,000 mg total) by mouth 2 (two) times daily. 180 tablet 3  . tiotropium (SPIRIVA HANDIHALER) 18 MCG inhalation capsule Place 1 capsule (18 mcg total) into inhaler and inhale daily. 30 capsule 3  . traZODone (DESYREL) 50 MG tablet Take 1 tablet (50 mg total) by mouth at bedtime as needed for sleep. 90 tablet 1  . valsartan-hydrochlorothiazide (DIOVAN HCT) 320-25 MG tablet Take 1 tablet by mouth daily. 90 tablet 1  . traMADol (ULTRAM) 50 MG tablet TAKE 1 TABLET EVERY DAY 60 tablet 2  . amLODipine (NORVASC) 10 MG tablet Take 1 tablet (10 mg total) by  mouth daily. 90 tablet 1  . amoxicillin-clavulanate (AUGMENTIN) 875-125 MG tablet Take 1 tablet by mouth 2 (two) times daily. (Patient not taking: Reported on 03/25/2017) 20 tablet 0  . acetaminophen-codeine (TYLENOL #3) 300-30 MG tablet Take 1 tablet by mouth every 12 (twelve) hours as needed for moderate pain. (Patient not taking: Reported on 03/25/2017) 60 tablet 0   No facility-administered medications prior to visit.     ROS Review of Systems Constitutional: Negative for activity change and appetite change.  HENT: see HPI   Eyes: Negative for visual disturbance.  Respiratory: Negative for cough, chest tightness and shortness of breath.   Cardiovascular: Negative for chest pain and leg swelling.  Gastrointestinal: Negative for abdominal pain, diarrhea, constipation and abdominal distention.  Endocrine: Negative.   Genitourinary: Negative for dysuria.  Musculoskeletal: Positive for back pain. Negative for myalgias and joint swelling.  Skin: Negative for rash.  Allergic/Immunologic: Negative.   Neurological: Positive for weakness and numbness. Negative for light-headedness.  Psychiatric/Behavioral: Positive for suicidal ideation and intent  Objective:  BP 123/79 (BP Location: Left Arm, Patient  Position: Sitting, Cuff Size: Large)   Pulse 67   Temp 98.3 F (36.8 C) (Oral)   Resp 16   Ht 5\' 8"  (1.727 m)   Wt 252 lb 9.6 oz (114.6 kg)   SpO2 97%   BMI 38.41 kg/m   BP/Weight 03/25/2017 03/24/2017 03/15/2017  Systolic BP 123 120 124  Diastolic BP 79 86 79  Wt. (Lbs) 252.6 251 258.6  BMI 38.41 37.07 38.19      Physical Exam  Constitutional: He is oriented to person, place, and time. He appears well-developed and well-nourished.  HENT:  Right Ear: External ear normal.  Mouth/Throat: Oropharynx is clear and moist.  Tenderness on the left otoscopy which is improved compared to last visit  Neck:  Anterior neck not as tender as at last visit but he does have some residual tenderness on the left side of his submandibular region.   Cardiovascular: Normal rate, normal heart sounds and intact distal pulses.   No murmur heard. Pulmonary/Chest: Effort normal and breath sounds normal. He has no wheezes. He has no rales. He exhibits no tenderness.  Abdominal: Soft. Bowel sounds are normal. He exhibits no distension and no mass. There is no tenderness.  Musculoskeletal: Normal range of motion.  Neurological: He is alert and oriented to person, place, and time.     Assessment & Plan:   1. Sore throat Continue Augmentin He will need laryngoscopy - Ambulatory referral to ENT  2. Lumbar disc herniation with radiculopathy Patient requests refill on Was previously referred to pain management which he never followed up with. - acetaminophen-codeine (TYLENOL #3) 300-30 MG tablet; Take 1 tablet by mouth every 12 (twelve) hours as needed for moderate pain.  Dispense: 60 tablet; Refill: 1   Meds ordered this encounter  Medications  . acetaminophen-codeine (TYLENOL #3) 300-30 MG tablet    Sig: Take 1 tablet by mouth every 12 (twelve) hours as needed for moderate pain.    Dispense:  60 tablet    Refill:  1    Follow-up: Return in about 3 weeks (around 04/15/2017) for Coordination of  care and follow-up on sore throat.Jaclyn Shaggy MD

## 2017-03-25 NOTE — Patient Instructions (Signed)
Sore Throat A sore throat is pain, burning, irritation, or scratchiness in the throat. When you have a sore throat, you may feel pain or tenderness in your throat when you swallow or talk. Many things can cause a sore throat, including:  An infection.  Seasonal allergies.  Dryness in the air.  Irritants, such as smoke or pollution.  Gastroesophageal reflux disease (GERD).  A tumor. A sore throat is often the first sign of another sickness. It may happen with other symptoms, such as coughing, sneezing, fever, and swollen neck glands. Most sore throats go away without medical treatment. Follow these instructions at home:  Take over-the-counter medicines only as told by your health care provider.  Drink enough fluids to keep your urine clear or pale yellow.  Rest as needed.  To help with pain, try:  Sipping warm liquids, such as broth, herbal tea, or warm water.  Eating or drinking cold or frozen liquids, such as frozen ice pops.  Gargling with a salt-water mixture 3-4 times a day or as needed. To make a salt-water mixture, completely dissolve -1 tsp of salt in 1 cup of warm water.  Sucking on hard candy or throat lozenges.  Putting a cool-mist humidifier in your bedroom at night to moisten the air.  Sitting in the bathroom with the door closed for 5-10 minutes while you run hot water in the shower.  Do not use any tobacco products, such as cigarettes, chewing tobacco, and e-cigarettes. If you need help quitting, ask your health care provider. Contact a health care provider if:  You have a fever for more than 2-3 days.  You have symptoms that last (are persistent) for more than 2-3 days.  Your throat does not get better within 7 days.  You have a fever and your symptoms suddenly get worse. Get help right away if:  You have difficulty breathing.  You cannot swallow fluids, soft foods, or your saliva.  You have increased swelling in your throat or neck.  You have  persistent nausea and vomiting. This information is not intended to replace advice given to you by your health care provider. Make sure you discuss any questions you have with your health care provider. Document Released: 12/16/2004 Document Revised: 07/04/2016 Document Reviewed: 08/29/2015 Elsevier Interactive Patient Education  2017 [employer]  

## 2017-03-28 NOTE — Telephone Encounter (Signed)
Patient notified of recommendation to contact [name] [facility] if he wishes to change primary care provider/location. He states he will contact [name].

## 2017-04-07 ENCOUNTER — Ambulatory Visit: Payer: BC Managed Care – PPO | Admitting: Family Medicine

## 2017-04-08 ENCOUNTER — Encounter: Payer: Self-pay | Admitting: Family Medicine

## 2017-04-08 ENCOUNTER — Ambulatory Visit: Payer: BC Managed Care – PPO | Attending: Family Medicine | Admitting: Family Medicine

## 2017-04-08 VITALS — BP 116/71 | HR 68 | Temp 98.1°F | Resp 18 | Ht 68.0 in | Wt 255.0 lb

## 2017-04-08 DIAGNOSIS — E0800 Diabetes mellitus due to underlying condition with hyperosmolarity without nonketotic hyperglycemic-hyperosmolar coma (NKHHC): Secondary | ICD-10-CM

## 2017-04-08 DIAGNOSIS — Z7902 Long term (current) use of antithrombotics/antiplatelets: Secondary | ICD-10-CM | POA: Insufficient documentation

## 2017-04-08 DIAGNOSIS — F331 Major depressive disorder, recurrent, moderate: Secondary | ICD-10-CM

## 2017-04-08 DIAGNOSIS — E119 Type 2 diabetes mellitus without complications: Secondary | ICD-10-CM | POA: Diagnosis present

## 2017-04-08 DIAGNOSIS — Z7984 Long term (current) use of oral hypoglycemic drugs: Secondary | ICD-10-CM | POA: Insufficient documentation

## 2017-04-08 DIAGNOSIS — Z7982 Long term (current) use of aspirin: Secondary | ICD-10-CM | POA: Insufficient documentation

## 2017-04-08 DIAGNOSIS — Z79899 Other long term (current) drug therapy: Secondary | ICD-10-CM | POA: Insufficient documentation

## 2017-04-08 DIAGNOSIS — E870 Hyperosmolality and hypernatremia: Secondary | ICD-10-CM | POA: Diagnosis not present

## 2017-04-08 DIAGNOSIS — E785 Hyperlipidemia, unspecified: Secondary | ICD-10-CM | POA: Diagnosis not present

## 2017-04-08 DIAGNOSIS — K648 Other hemorrhoids: Secondary | ICD-10-CM | POA: Diagnosis not present

## 2017-04-08 DIAGNOSIS — Z955 Presence of coronary angioplasty implant and graft: Secondary | ICD-10-CM

## 2017-04-08 DIAGNOSIS — I10 Essential (primary) hypertension: Secondary | ICD-10-CM

## 2017-04-08 DIAGNOSIS — J4489 Other specified chronic obstructive pulmonary disease: Secondary | ICD-10-CM

## 2017-04-08 DIAGNOSIS — J449 Chronic obstructive pulmonary disease, unspecified: Secondary | ICD-10-CM | POA: Diagnosis not present

## 2017-04-08 DIAGNOSIS — Z1211 Encounter for screening for malignant neoplasm of colon: Secondary | ICD-10-CM | POA: Diagnosis not present

## 2017-04-08 DIAGNOSIS — I2510 Atherosclerotic heart disease of native coronary artery without angina pectoris: Secondary | ICD-10-CM | POA: Diagnosis not present

## 2017-04-08 DIAGNOSIS — Z87891 Personal history of nicotine dependence: Secondary | ICD-10-CM | POA: Insufficient documentation

## 2017-04-08 DIAGNOSIS — K219 Gastro-esophageal reflux disease without esophagitis: Secondary | ICD-10-CM | POA: Diagnosis not present

## 2017-04-08 LAB — GLUCOSE, POCT (MANUAL RESULT ENTRY): POC Glucose: 159 mg/dl — AB (ref 70–99)

## 2017-04-08 MED ORDER — MOMETASONE FUROATE 220 MCG/INH IN AEPB: 2.0000 | INHALATION_SPRAY | Freq: Every day | RESPIRATORY_TRACT | 1 refills | Status: DC

## 2017-04-08 MED ORDER — AMLODIPINE BESYLATE 10 MG PO TABS: 10.0000 mg | ORAL_TABLET | Freq: Every day | ORAL | 1 refills | Status: DC

## 2017-04-08 MED ORDER — FLUOXETINE HCL 20 MG PO TABS: 20.0000 mg | ORAL_TABLET | Freq: Every day | ORAL | 1 refills | Status: DC

## 2017-04-08 MED ORDER — CETIRIZINE HCL 10 MG PO TABS: 10.0000 mg | ORAL_TABLET | Freq: Every day | ORAL | 1 refills | Status: DC

## 2017-04-08 MED ORDER — HYDROCORTISONE ACE-PRAMOXINE 2.5-1 % RE CREA: 1.0000 "application " | TOPICAL_CREAM | Freq: Three times a day (TID) | RECTAL | 1 refills | Status: DC

## 2017-04-08 MED ORDER — ATORVASTATIN CALCIUM 40 MG PO TABS: 40.0000 mg | ORAL_TABLET | Freq: Every day | ORAL | 1 refills | Status: DC

## 2017-04-08 MED ORDER — VALSARTAN-HYDROCHLOROTHIAZIDE 320-25 MG PO TABS: 1.0000 | ORAL_TABLET | Freq: Every day | ORAL | 1 refills | Status: DC

## 2017-04-08 MED ORDER — CLOPIDOGREL BISULFATE 75 MG PO TABS: 75.0000 mg | ORAL_TABLET | Freq: Every day | ORAL | 1 refills | Status: DC

## 2017-04-08 MED ORDER — TIOTROPIUM BROMIDE MONOHYDRATE 18 MCG IN CAPS: 18.0000 ug | ORAL_CAPSULE | Freq: Every day | RESPIRATORY_TRACT | 3 refills | Status: DC

## 2017-04-08 MED ORDER — ALBUTEROL SULFATE 108 (90 BASE) MCG/ACT IN AEPB: 1.0000 | INHALATION_SPRAY | Freq: Four times a day (QID) | RESPIRATORY_TRACT | 1 refills | Status: DC | PRN

## 2017-04-08 MED ORDER — HYDROCORTISONE ACE-PRAMOXINE 1-1 % RE CREA: 1.0000 "application " | TOPICAL_CREAM | Freq: Two times a day (BID) | RECTAL | 0 refills | Status: DC

## 2017-04-08 MED ORDER — METFORMIN HCL 1000 MG PO TABS: 1000.0000 mg | ORAL_TABLET | Freq: Two times a day (BID) | ORAL | 1 refills | Status: DC

## 2017-04-08 MED ORDER — PANTOPRAZOLE SODIUM 40 MG PO TBEC: 40.0000 mg | DELAYED_RELEASE_TABLET | Freq: Every day | ORAL | 1 refills | Status: DC

## 2017-04-08 MED FILL — CLOPIDOGREL 75 MG TABLET: 75 | 30 days supply | Qty: 30 | Fill #0

## 2017-04-08 MED FILL — AMLODIPINE BESYLATE 10 MG T: 10 | 30 days supply | Qty: 30 | Fill #0

## 2017-04-08 MED FILL — metFORMIN HCL 1000 MG TABS: 1000 | 30 days supply | Qty: 60 | Fill #0

## 2017-04-08 MED FILL — VALSARTAN-HCTZ 320-25 MG TA: 320-25 | 30 days supply | Qty: 30 | Fill #0

## 2017-04-08 MED FILL — SPIRIVA 18 MCG CP-HANDIHALE: 18 | 30 days supply | Qty: 30 | Fill #0

## 2017-04-08 MED FILL — ATORVASTATIN 40 MG TABLET: 40 | 30 days supply | Qty: 30 | Fill #0

## 2017-04-08 MED FILL — FLUoxetine HCL 20 MG CAPS: 20 | 30 days supply | Qty: 30 | Fill #0

## 2017-04-08 MED FILL — ASMANEX TWISTHALR 220 MCG #: 220 | 60 days supply | Qty: 1 | Fill #0

## 2017-04-08 MED FILL — PANTOPRAZOLE SOD DR 40 MG T: 40 | 30 days supply | Qty: 30 | Fill #0

## 2017-04-08 NOTE — Patient Instructions (Signed)

## 2017-04-08 NOTE — Progress Notes (Signed)
Patient states antibiotics cleared up the sore throat.  Patient has not taken medication today. Patient has eaten today.

## 2017-04-08 NOTE — Progress Notes (Signed)
Subjective:  Patient ID: Andrew West, male    DOB: [birth year]  Age: 51 y.o. MRN: [id number]  CC: Leg Pain   HPI Andrew West  is a 51 y.o. with a  Medical history of Type 2 DM (A1c 6.1), CAD s/p PCI/stent to LCx and OM1In 09/2015; cutting balloon angioplasty to OM 1, HLD, HTN, previous tobacco abuse (quit 2 years ago) chronic back pain s/p laminectomy/discectomy (3 years ago by Dr Dutch Quint), depression who comes into the clinic for follow-up visit.  Cardiac cath from 11/2016 revealed total occlusion of the stented segment in the LCx,OM2 branch; suboptimal minimally effective PTCA was performed in mid CX-1 and mid CX-2 lesions with 50% residual stenosis post intervention) due to inability to pass a stent to desired location. Recommendation was to maximize medical management.  He was commenced on Lyrica for possible neurological component to his chest pain by his cardiologist.  He comes in for follow-up of his sore throat which he states has resolved after completion of his Augmentin. He had been referred to ENT due to persisting symptoms and his appointment is not until 04/2017.   He does have persisting low back pain which radiates to his legs and takes Tylenol No. 3 for this. Also complains of feeling "a knot in his rectum"; he has had previous hemorrhoid surgery. Endorses intermittent blood in his stools and is sometimes constipated.  Past Medical History:  Diagnosis Date  . Anxiety   . CAD (coronary artery disease)    a. s/p multiple PCIs with last cath 11/2016 with severe multivessel CAD, s/p PCTA to LCx but unable to pass stent  . Chronic leg pain    bilateral  . Chronic lower back pain   . COPD (chronic obstructive pulmonary disease) (HCC)   . Depression   . GERD (gastroesophageal reflux disease)    Takes Dexilant  . HLD (hyperlipidemia)   . Hypertension   . Rhabdomyolysis    h/o, r/t statins  . Sleep apnea    "can't tolerate mask" (12/16/2016)  . Type II diabetes  mellitus (HCC)     Past Surgical History:  Procedure Laterality Date  . BACK SURGERY    . CARDIAC CATHETERIZATION N/A 09/25/2015   Procedure: Left Heart Cath and Coronary Angiography;  Surgeon: Marykay Lex, MD;  Location: [facility] INVASIVE CV LAB;  Service: Cardiovascular;  Laterality: N/A;  . CARDIAC CATHETERIZATION N/A 12/16/2016   Procedure: Left Heart Cath and Coronary Angiography;  Surgeon: Marykay Lex, MD;  Location: [facility] INVASIVE CV LAB;  Service: Cardiovascular;  Laterality: N/A;  . CARDIAC CATHETERIZATION N/A 12/16/2016   Procedure: Coronary Balloon Angioplasty;  Surgeon: Marykay Lex, MD;  Location: [facility]VAS[facility] CV LAB;  Service: Cardiovascular;  Laterality: N/A;  . COLONOSCOPY W/ POLYPECTOMY    . CORONARY ANGIOPLASTY  09/25/2015   mid cir & om  . CORONARY ANGIOPLASTY WITH STENT PLACEMENT  10/09/2001   PTCA & stenting of mid AV circumflex; 2.5x23mm Pixel stent  . CORONARY ANGIOPLASTY WITH STENT PLACEMENT  12/13/2001   PCI with stent to mid L circumflex, 95% stenosis to 0% residual  . CORONARY ANGIOPLASTY WITH STENT PLACEMENT  10/10/2003   PCI to mid AV circumflex; LAD 30% disease; RCA 100% occluded prox.  . CORONARY ANGIOPLASTY WITH STENT PLACEMENT  09/01/2011   PCI with stenting with bare metal stent to mid AV groove circumflex and PDA  . CORONARY ANGIOPLASTY WITH STENT PLACEMENT  10/17/2011   cutting balloon angioplasty of ostial lateral OM1  branch and bifurcation AV groove circumflex OM junction; stenosis reduced to 0%  . EXCISIONAL HEMORRHOIDECTOMY    . LEFT HEART CATHETERIZATION WITH CORONARY ANGIOGRAM N/A 10/18/2011   Procedure: LEFT HEART CATHETERIZATION WITH CORONARY ANGIOGRAM;  Surgeon: Marykay Lex, MD;  Location: [facility] CATH LAB;  Service: Cardiovascular;  Laterality: N/A;  . LUMBAR LAMINECTOMY/DECOMPRESSION MICRODISCECTOMY  03/31/2012   Procedure: LUMBAR LAMINECTOMY/DECOMPRESSION MICRODISCECTOMY 1 LEVEL;  Surgeon: Temple Pacini, MD;  Location: [facility] NEURO ORS;  Service:  Neurosurgery;  Laterality: Left;  . TRANSTHORACIC ECHOCARDIOGRAM  07/28/2011   EF 55-65%; LVH, grade 1 diastolic dysfunction;     Allergies  Allergen Reactions  . Iohexol Anaphylaxis    PT. TO BE PREMEDICATED PRIOR TO IV CONTRAST PER DR Eppie Gibson /MMS//12/15/15Desc: PT BECAME SOB AND CHEST TIGHTNESS AFTER CONTRAST INJECTION.  [name],RT-RCT., Onset Date: [id number]       Outpatient Medications Prior to Visit  Medication Sig Dispense Refill  . acetaminophen (TYLENOL) 500 MG tablet Take 1,000 mg by mouth every 6 (six) hours as needed for headache.    Marland Kitchen acetaminophen-codeine (TYLENOL #3) 300-30 MG tablet Take 1 tablet by mouth every 12 (twelve) hours as needed for moderate pain. 60 tablet 1  . aspirin 81 MG tablet Take 1 tablet (81 mg total) by mouth daily.    . fluticasone (FLONASE) 50 MCG/ACT nasal spray Place 2 sprays into both nostrils daily. 16 g 6  . furosemide (LASIX) 20 MG tablet Take 20 mg by mouth 2 (two) times daily.    . isosorbide mononitrate (IMDUR) 30 MG 24 hr tablet Take 90mg  (3 tablets) in the morning and 60mg  (2 tablets) in the PM 90 tablet 3  . Nebivolol HCl 20 MG TABS Take 1 tablet (20 mg total) by mouth daily. 90 tablet 3  . nitroGLYCERIN (NITROSTAT) 0.4 MG SL tablet Place 1 tablet (0.4 mg total) under the tongue every 5 (five) minutes as needed for chest pain. 25 tablet 2  . pregabalin (LYRICA) 75 MG capsule Take 1 capsule (75 mg total) by mouth 3 (three) times daily. 270 capsule 3  . ranolazine (RANEXA) 1000 MG SR tablet Take 1 tablet (1,000 mg total) by mouth 2 (two) times daily. 180 tablet 3  . traZODone (DESYREL) 50 MG tablet Take 1 tablet (50 mg total) by mouth at bedtime as needed for sleep. 90 tablet 1  . Albuterol Sulfate (PROAIR RESPICLICK) 108 (90 Base) MCG/ACT AEPB Inhale 1 puff into the lungs every 6 (six) hours as needed (shortness of breath). 3 each 1  . atorvastatin (LIPITOR) 40 MG tablet Take 1 tablet (40 mg total) by mouth daily. 90 tablet 1  .  benzonatate (TESSALON) 200 MG capsule Take 1 capsule (200 mg total) by mouth 2 (two) times daily as needed for cough. 20 capsule 0  . cetirizine (ZYRTEC) 10 MG tablet Take 1 tablet (10 mg total) by mouth daily. 30 tablet 1  . clopidogrel (PLAVIX) 75 MG tablet Take 1 tablet (75 mg total) by mouth daily. 90 tablet 1  . FLUoxetine (PROZAC) 20 MG tablet Take 1 tablet (20 mg total) by mouth daily. 90 tablet 1  . metFORMIN (GLUCOPHAGE) 1000 MG tablet Take 1 tablet (1,000 mg total) by mouth 2 (two) times daily with a meal. 180 tablet 1  . mometasone (ASMANEX) 220 MCG/INH inhaler Inhale 2 puffs into the lungs daily. 3 Inhaler 1  . pantoprazole (PROTONIX) 40 MG tablet Take 1 tablet (40 mg total) by mouth daily. 90 tablet 1  . tiotropium (  SPIRIVA HANDIHALER) 18 MCG inhalation capsule Place 1 capsule (18 mcg total) into inhaler and inhale daily. 30 capsule 3  . valsartan-hydrochlorothiazide (DIOVAN HCT) 320-25 MG tablet Take 1 tablet by mouth daily. 90 tablet 1  . amLODipine (NORVASC) 10 MG tablet Take 1 tablet (10 mg total) by mouth daily. 90 tablet 1  . amoxicillin-clavulanate (AUGMENTIN) 875-125 MG tablet Take 1 tablet by mouth 2 (two) times daily. (Patient not taking: Reported on 03/25/2017) 20 tablet 0   No facility-administered medications prior to visit.     ROS Review of Systems Constitutional: Negative for activity change and appetite change.  HENT: see HPI   Eyes: Negative for visual disturbance.  Respiratory: Negative for cough, chest tightness and shortness of breath.   Cardiovascular: Negative for chest pain and leg swelling.  Gastrointestinal: see hpi Endocrine: Negative.   Genitourinary: Negative for dysuria.  Musculoskeletal: Positive for back pain. Negative for myalgias and joint swelling.  Skin: Negative for rash.  Allergic/Immunologic: Negative.   Neurological: Positive for weakness and numbness. Negative for light-headedness.  Psychiatric/Behavioral: Positive for suicidal ideation  and intent  Objective:  BP 116/71 (BP Location: Left Arm, Patient Position: Sitting, Cuff Size: Large)   Pulse 68   Temp 98.1 F (36.7 C) (Oral)   Resp 18   Ht 5\' 8"  (1.727 m)   Wt 255 lb (115.7 kg)   SpO2 96%   BMI 38.77 kg/m   BP/Weight 04/08/2017 03/25/2017 03/24/2017  Systolic BP 116 123 120  Diastolic BP 71 79 86  Wt. (Lbs) 255 252.6 251  BMI 38.77 38.41 37.07      Physical Exam Constitutional: He is oriented to person, place, and time. He appears well-developed and well-nourished.  HENT:  Right Ear: External ear normal.  Mouth/Throat: Oropharynx is clear and moist., Normal tympanic membranes bilaterally  Neck: No JVD, no tenderness  Cardiovascular: Normal rate, normal heart sounds and intact distal pulses.   No murmur heard. Pulmonary/Chest: Effort normal and breath sounds normal. He has no wheezes. He has no rales. He exhibits no tenderness.  Abdominal: Soft. Bowel sounds are normal. He exhibits no distension and no mass. There is no tenderness.  Musculoskeletal: Normal range of motion.  Neurological: He is alert and oriented to person, place, and time.   Lab Results  Component Value Date   HGBA1C 6.1 03/15/2017    Assessment & Plan:   1. Diabetes mellitus due to underlying condition with hyperosmolarity without coma, without long-term current use of insulin (HCC) Controlled with A1c of 6.1 - Glucose (CBG) - metFORMIN (GLUCOPHAGE) 1000 MG tablet; Take 1 tablet (1,000 mg total) by mouth 2 (two) times daily with a meal.  Dispense: 180 tablet; Refill: 1 - Ambulatory referral to Ophthalmology  2. COPD with asthma (HCC) No acute exacerbation - Albuterol Sulfate (PROAIR RESPICLICK) 108 (90 Base) MCG/ACT AEPB; Inhale 1 puff into the lungs every 6 (six) hours as needed (shortness of breath).  Dispense: 3 each; Refill: 1 - mometasone (ASMANEX) 220 MCG/INH inhaler; Inhale 2 puffs into the lungs daily.  Dispense: 3 Inhaler; Refill: 1  3. Essential  hypertension Controlled - amLODipine (NORVASC) 10 MG tablet; Take 1 tablet (10 mg total) by mouth daily.  Dispense: 90 tablet; Refill: 1 - valsartan-hydrochlorothiazide (DIOVAN HCT) 320-25 MG tablet; Take 1 tablet by mouth daily.  Dispense: 90 tablet; Refill: 1  4. Presence of stent in left circumflex coronary artery Risk factor modification - atorvastatin (LIPITOR) 40 MG tablet; Take 1 tablet (40 mg total) by mouth  daily.  Dispense: 90 tablet; Refill: 1 - clopidogrel (PLAVIX) 75 MG tablet; Take 1 tablet (75 mg total) by mouth daily.  Dispense: 90 tablet; Refill: 1  5. Moderate episode of recurrent major depressive disorder (HCC) Stable - FLUoxetine (PROZAC) 20 MG tablet; Take 1 tablet (20 mg total) by mouth daily.  Dispense: 90 tablet; Refill: 1  6. Gastroesophageal reflux disease without esophagitis Controlled - pantoprazole (PROTONIX) 40 MG tablet; Take 1 tablet (40 mg total) by mouth daily.  Dispense: 90 tablet; Refill: 1  7. Screening for colon cancer - Ambulatory referral to Gastroenterology  8. Other hemorrhoids Advised to avoid constipation Refuses a rectal exam - pramoxine-hydrocortisone (ANALPRAM-HC) 1-1 % rectal cream; Place 1 application rectally 2 (two) times daily.  Dispense: 30 g; Refill: 0   Meds ordered this encounter  Medications  . Albuterol Sulfate (PROAIR RESPICLICK) 108 (90 Base) MCG/ACT AEPB    Sig: Inhale 1 puff into the lungs every 6 (six) hours as needed (shortness of breath).    Dispense:  3 each    Refill:  1  . amLODipine (NORVASC) 10 MG tablet    Sig: Take 1 tablet (10 mg total) by mouth daily.    Dispense:  90 tablet    Refill:  1  . atorvastatin (LIPITOR) 40 MG tablet    Sig: Take 1 tablet (40 mg total) by mouth daily.    Dispense:  90 tablet    Refill:  1    Discontinue simvastatin  . cetirizine (ZYRTEC) 10 MG tablet    Sig: Take 1 tablet (10 mg total) by mouth daily.    Dispense:  30 tablet    Refill:  1  . clopidogrel (PLAVIX) 75 MG  tablet    Sig: Take 1 tablet (75 mg total) by mouth daily.    Dispense:  90 tablet    Refill:  1  . FLUoxetine (PROZAC) 20 MG tablet    Sig: Take 1 tablet (20 mg total) by mouth daily.    Dispense:  90 tablet    Refill:  1  . mometasone (ASMANEX) 220 MCG/INH inhaler    Sig: Inhale 2 puffs into the lungs daily.    Dispense:  3 Inhaler    Refill:  1  . metFORMIN (GLUCOPHAGE) 1000 MG tablet    Sig: Take 1 tablet (1,000 mg total) by mouth 2 (two) times daily with a meal.    Dispense:  180 tablet    Refill:  1  . pantoprazole (PROTONIX) 40 MG tablet    Sig: Take 1 tablet (40 mg total) by mouth daily.    Dispense:  90 tablet    Refill:  1  . tiotropium (SPIRIVA HANDIHALER) 18 MCG inhalation capsule    Sig: Place 1 capsule (18 mcg total) into inhaler and inhale daily.    Dispense:  30 capsule    Refill:  3  . valsartan-hydrochlorothiazide (DIOVAN HCT) 320-25 MG tablet    Sig: Take 1 tablet by mouth daily.    Dispense:  90 tablet    Refill:  1  . pramoxine-hydrocortisone (ANALPRAM-HC) 1-1 % rectal cream    Sig: Place 1 application rectally 2 (two) times daily.    Dispense:  30 g    Refill:  0    Follow-up: Return for Follow-up on chronic medical conditions. - 3 mos  Jaclyn Shaggy MD

## 2017-04-11 ENCOUNTER — Emergency Department (HOSPITAL_COMMUNITY)
Admission: EM | Admit: 2017-04-11 | Discharge: 2017-04-11 | Disposition: A | Discharge status: Home / Self Care | Payer: BC Managed Care – PPO | Attending: Emergency Medicine | Admitting: Emergency Medicine

## 2017-04-11 ENCOUNTER — Encounter (HOSPITAL_COMMUNITY): Payer: Self-pay | Admitting: Emergency Medicine

## 2017-04-11 ENCOUNTER — Encounter: Payer: Self-pay | Admitting: Family Medicine

## 2017-04-11 DIAGNOSIS — Z87891 Personal history of nicotine dependence: Secondary | ICD-10-CM | POA: Diagnosis not present

## 2017-04-11 DIAGNOSIS — J449 Chronic obstructive pulmonary disease, unspecified: Secondary | ICD-10-CM | POA: Insufficient documentation

## 2017-04-11 DIAGNOSIS — K0889 Other specified disorders of teeth and supporting structures: Secondary | ICD-10-CM

## 2017-04-11 DIAGNOSIS — I2510 Atherosclerotic heart disease of native coronary artery without angina pectoris: Secondary | ICD-10-CM | POA: Diagnosis not present

## 2017-04-11 DIAGNOSIS — Z7982 Long term (current) use of aspirin: Secondary | ICD-10-CM | POA: Diagnosis not present

## 2017-04-11 DIAGNOSIS — Z955 Presence of coronary angioplasty implant and graft: Secondary | ICD-10-CM | POA: Diagnosis not present

## 2017-04-11 DIAGNOSIS — Z79899 Other long term (current) drug therapy: Secondary | ICD-10-CM | POA: Insufficient documentation

## 2017-04-11 DIAGNOSIS — I10 Essential (primary) hypertension: Secondary | ICD-10-CM | POA: Insufficient documentation

## 2017-04-11 DIAGNOSIS — E119 Type 2 diabetes mellitus without complications: Secondary | ICD-10-CM | POA: Diagnosis not present

## 2017-04-11 DIAGNOSIS — Z7984 Long term (current) use of oral hypoglycemic drugs: Secondary | ICD-10-CM | POA: Diagnosis not present

## 2017-04-11 MED ORDER — MAGIC MOUTHWASH: 5.0000 mL | Freq: Every day | ORAL | 0 refills | Status: DC

## 2017-04-11 MED ORDER — CLINDAMYCIN HCL 150 MG PO CAPS: 450.0000 mg | ORAL_CAPSULE | Freq: Three times a day (TID) | ORAL | 0 refills | Status: AC

## 2017-04-11 MED ORDER — MAGIC MOUTHWASH
5.0000 mL | Freq: Once | ORAL | Status: AC
  Administered 2017-04-11: 5 mL via ORAL
  Filled 2017-04-11: qty 5

## 2017-04-11 NOTE — ED Triage Notes (Signed)
C/o pain in left side of face and jaw-- top molar with cavity-- [facility]- given antibiotic.

## 2017-04-11 NOTE — ED Provider Notes (Signed)
[place] DEPT Provider Note   CSN: [id number] Arrival date & time: 04/11/17  1334   By signing my name below, I, [name], attest that this documentation has been prepared under the direction and in the presence of [name], [place]. Electronically Signed: [name], ED Scribe. 04/11/2017. 3:53 PM.   History   Chief Complaint Chief Complaint  Patient presents with  . Dental Pain   The history is provided by the patient. No language interpreter was used.   HPI Comments:  Andrew West is a 51 y.o. male who presents to the Emergency Department complaining of worsening upper left dental pain since yesterday. Pt complains of associated swelling. He states that this has been an ongoing problem for >1 month. [facility]. He finished his course yesterday with no relief. Pt was seen by a dentist for a cavity of an upper left molar 1 month ago, but could not afford an extraction. Denies fever, chills, Difficulty swallowing, or other symptoms.   Past Medical History:  Diagnosis Date  . Anxiety   . CAD (coronary artery disease)    a. s/p multiple PCIs with last cath 11/2016 with severe multivessel CAD, s/p PCTA to LCx but unable to pass stent  . Chronic leg pain    bilateral  . Chronic lower back pain   . COPD (chronic obstructive pulmonary disease) ([place])   . Depression   . GERD (gastroesophageal reflux disease)    Takes Dexilant  . HLD (hyperlipidemia)   . Hypertension   . Rhabdomyolysis    h/o, r/t statins  . Sleep apnea    "can't tolerate mask" (12/16/2016)  . Type II diabetes mellitus [facility])     Patient Active Problem List   Diagnosis Date Noted  . Complex regional pain syndrome type I 03/24/2017  . Progressive angina ([place]) -Class III 12/16/2016  . Anxiety 06/28/2016  . Diastolic dysfunction-grade 2 with EF 60-65% Oct 2016 03/22/2016  . Hypertension 10/03/2015  . CAD S/P multiple PCI's  10/03/2015  . Pulmonary nodule 06/24/2015  . Cough 06/24/2015  . COPD with asthma ([place]) 06/24/2015  . Reactive airway disease 06/04/2015  . DOE (dyspnea on exertion) 04/15/2015  . Lumbar disc herniation with radiculopathy 03/31/2012  . Presence of stent in left circumflex coronary artery 10/18/2011    Class: History of  . TOBACCO ABUSE 02/27/2009  . ABDOMINAL PAIN, LEFT LOWER QUADRANT 12/30/2008  . ABDOMINAL PAIN, EPIGASTRIC 12/05/2008  . Depression 09/05/2008  . Hereditary and idiopathic peripheral neuropathy 09/05/2008  . GERD 09/05/2008  . Cervical disc disorder with radiculopathy of cervical region 08/19/2008  . HLD (hyperlipidemia) 04/25/2008    Class: Diagnosis of  . ALLERGIC RHINITIS 04/25/2008  . Backache 04/25/2008  . Chest pain, unspecified 04/25/2008  . COLONIC POLYPS, HX OF 04/25/2008  . Obesity 04/18/2008    Class: Diagnosis of  . ANAL FISSURE, HX OF 04/18/2008  . Diabetes mellitus ([place]) 01/30/2008    Class: History of  . RECTAL BLEEDING 01/30/2008    Past Surgical History:  Procedure Laterality Date  . BACK SURGERY    . CARDIAC CATHETERIZATION N/A 09/25/2015   Procedure: Left Heart Cath and Coronary Angiography;  Surgeon: Leonie Man, MD;  Location: [place] CV LAB;  Service: Cardiovascular;  Laterality: N/A;  . CARDIAC CATHETERIZATION N/A 12/16/2016   Procedure: Left Heart Cath and Coronary Angiography;  Surgeon: Leonie Man, MD;  Location: [place] CV LAB;  Service: Cardiovascular;  Laterality: N/A;  . CARDIAC CATHETERIZATION N/A 12/16/2016   Procedure: Coronary Balloon Angioplasty;  Surgeon: Leonie Man, MD;  Location: [facility] CV LAB;  Service: Cardiovascular;  Laterality: N/A;  . COLONOSCOPY W/ POLYPECTOMY    . CORONARY ANGIOPLASTY  09/25/2015   mid cir & om  . CORONARY ANGIOPLASTY WITH STENT PLACEMENT  10/09/2001   PTCA & stenting of mid AV circumflex; 2.5x53m Pixel stent  . CORONARY ANGIOPLASTY WITH STENT PLACEMENT  12/13/2001   PCI  with stent to mid L circumflex, 95% stenosis to 0% residual  . CORONARY ANGIOPLASTY WITH STENT PLACEMENT  10/10/2003   PCI to mid AV circumflex; LAD 30% disease; RCA 100% occluded prox.  . CORONARY ANGIOPLASTY WITH STENT PLACEMENT  09/01/2011   PCI with stenting with bare metal stent to mid AV groove circumflex and PDA  . CORONARY ANGIOPLASTY WITH STENT PLACEMENT  10/17/2011   cutting balloon angioplasty of ostial lateral OM1 branch and bifurcation AV groove circumflex OM junction; stenosis reduced to 0%  . EXCISIONAL HEMORRHOIDECTOMY    . LEFT HEART CATHETERIZATION WITH CORONARY ANGIOGRAM N/A 10/18/2011   Procedure: LEFT HEART CATHETERIZATION WITH CORONARY ANGIOGRAM;  Surgeon: DLeonie Man MD;  [facility];  Service: Cardiovascular;  Laterality: N/A;  . LUMBAR LAMINECTOMY/DECOMPRESSION MICRODISCECTOMY  03/31/2012   Procedure: LUMBAR LAMINECTOMY/DECOMPRESSION MICRODISCECTOMY 1 LEVEL;  Surgeon: HCharlie Pitter MD;  [facility];  Service: Neurosurgery;  Laterality: Left;  . TRANSTHORACIC ECHOCARDIOGRAM  07/28/2011   EF 55-65%; LVH, grade 1 diastolic dysfunction;        Home Medications    Prior to Admission medications   Medication Sig Start Date End Date Taking? Authorizing Provider  acetaminophen (TYLENOL) 500 MG tablet Take 1,000 mg by mouth every 6 (six) hours as needed for headache.    [provider]  acetaminophen-codeine (TYLENOL #3) 300-30 MG tablet Take 1 tablet by mouth every 12 (twelve) hours as needed for moderate pain. 03/25/17   AArnoldo Morale MD  Albuterol Sulfate (PROAIR RESPICLICK) 1297(90 Base) MCG/ACT AEPB Inhale 1 puff into the lungs every 6 (six) hours as needed (shortness of breath). 04/08/17   AArnoldo Morale MD  amLODipine (NORVASC) 10 MG tablet Take 1 tablet (10 mg total) by mouth daily. 04/08/17 05/08/17  AArnoldo Morale MD  aspirin 81 MG tablet Take 1 tablet (81 mg total) by mouth daily. 03/23/16   TEileen Stanford PA-C  atorvastatin  (LIPITOR) 40 MG tablet Take 1 tablet (40 mg total) by mouth daily. 04/08/17   AArnoldo Morale MD  cetirizine (ZYRTEC) 10 MG tablet Take 1 tablet (10 mg total) by mouth daily. 04/08/17   AArnoldo Morale MD  clindamycin (CLEOCIN) 150 MG capsule Take 3 capsules (450 mg total) by mouth 3 (three) times daily. 04/11/17 04/18/17  MEmeline General PA-C  clopidogrel (PLAVIX) 75 MG tablet Take 1 tablet (75 mg total) by mouth daily. 04/08/17   AArnoldo Morale MD  FLUoxetine (PROZAC) 20 MG tablet Take 1 tablet (20 mg total) by mouth daily. 04/08/17   AArnoldo Morale MD  fluticasone (FLONASE) 50 MCG/ACT nasal spray Place 2 sprays into both nostrils daily. 02/24/17   MArgentina Donovan PA-C  furosemide (LASIX) 20 MG tablet Take 20 mg by mouth 2 (two) times daily.    [provider]  hydrocortisone-pramoxine (ANALPRAM HC) 2.5-1 % rectal cream Place 1 application rectally 3 (three) times daily. 04/08/17   AArnoldo Morale MD  isosorbide mononitrate (IMDUR) 30 MG 24 hr  tablet Take '90mg'$  (3 tablets) in the morning and '60mg'$  (2 tablets) in the PM 12/24/16   Strader, [place] M, PA-C  magic mouthwash SOLN Take 5 mLs by mouth daily. 04/11/17   Emeline General, PA-C  metFORMIN (GLUCOPHAGE) 1000 MG tablet Take 1 tablet (1,000 mg total) by mouth 2 (two) times daily with a meal. 04/08/17   Arnoldo Morale, MD  mometasone (ASMANEX) 220 MCG/INH inhaler Inhale 2 puffs into the lungs daily. 04/08/17   Arnoldo Morale, MD  Nebivolol HCl 20 MG TABS Take 1 tablet (20 mg total) by mouth daily. 11/08/16   Hilty, Nadean Corwin, MD  nitroGLYCERIN (NITROSTAT) 0.4 MG SL tablet Place 1 tablet (0.4 mg total) under the tongue every 5 (five) minutes as needed for chest pain. 12/24/16   Ahmed Prima, Fransisco Hertz, PA-C  pantoprazole (PROTONIX) 40 MG tablet Take 1 tablet (40 mg total) by mouth daily. 04/08/17   Arnoldo Morale, MD  pregabalin (LYRICA) 75 MG capsule Take 1 capsule (75 mg total) by mouth 3 (three) times daily. 03/24/17   Hilty, Nadean Corwin, MD  ranolazine  (RANEXA) 1000 MG SR tablet Take 1 tablet (1,000 mg total) by mouth 2 (two) times daily. 03/29/16   Hilty, Nadean Corwin, MD  tiotropium (SPIRIVA HANDIHALER) 18 MCG inhalation capsule Place 1 capsule (18 mcg total) into inhaler and inhale daily. 04/08/17   Arnoldo Morale, MD  traZODone (DESYREL) 50 MG tablet Take 1 tablet (50 mg total) by mouth at bedtime as needed for sleep. 03/15/17   Arnoldo Morale, MD  valsartan-hydrochlorothiazide (DIOVAN HCT) 320-25 MG tablet Take 1 tablet by mouth daily. 04/08/17   Arnoldo Morale, MD    Family History Family History  Problem Relation Age of Onset  . Heart attack Father   . Hypertension Mother   . Diabetes Mother   . Heart disease Brother        x 3   . Heart attack Brother        deceased  . Hypertension Sister   . Diabetes Sister   . Anesthesia problems Neg Hx   . Hypotension Neg Hx   . Malignant hyperthermia Neg Hx   . Pseudochol deficiency Neg Hx     Social History Social History  Substance Use Topics  . Smoking status: Former Smoker    Packs/day: 0.25    Years: 25.00    Types: Cigarettes    Quit date: 05/24/2015  . Smokeless tobacco: Never Used  . Alcohol use No     Allergies   Iohexol   Review of Systems Review of Systems  Constitutional: Negative for chills and fever.  HENT: Positive for dental problem and facial swelling. Negative for drooling, sore throat and trouble swallowing.   Respiratory: Negative for cough, choking, chest tightness, shortness of breath, wheezing and stridor.   Cardiovascular: Negative for chest pain.  Gastrointestinal: Negative for nausea and vomiting.  Musculoskeletal: Negative for myalgias, neck pain and neck stiffness.  Skin: Negative for color change, pallor, rash and wound.     Physical Exam Updated Vital Signs BP (!) 158/95 (BP Location: Left Arm)   Pulse 82   Temp 98.6 F (37 C) (Oral)   Resp 18   Ht '5\' 8"'$  (1.727 m)   Wt 115.7 kg (255 lb)   SpO2 100%   BMI 38.77 kg/m   Physical Exam    Constitutional: He appears well-developed and well-nourished. No distress.  Patient is afebrile, non-toxic appearing, sitting comfortably in chair in no acute distress.  HENT:  Head: Normocephalic and atraumatic.  Mouth/Throat: Uvula is midline, oropharynx is clear and moist and mucous membranes are normal. No oral lesions. No trismus in the jaw. Abnormal dentition. Dental caries present. No dental abscesses or uvula swelling. No oropharyngeal exudate, posterior oropharyngeal edema, posterior oropharyngeal erythema or tonsillar abscesses. No tonsillar exudate.    No trismus. No clicking in TMJ. Second to last upper left tooth, and 3rd left tooth from front are tender. Mild swelling to left side of face over maxillary sinus. Cervical adenopathy. Tolerating secretions well. Uvula midline. Arches symmetrical. No exudates or abscess. Sublingual mucosa nontender. No concern for Ludwig's angina.   Eyes: Conjunctivae and EOM are normal. Right eye exhibits no discharge. Left eye exhibits no discharge.  Neck: Normal range of motion. Neck supple.  Cardiovascular: Normal rate, regular rhythm, normal heart sounds and intact distal pulses.   Pulmonary/Chest: Effort normal and breath sounds normal. No stridor. No respiratory distress. He has no wheezes. He has no rales. He exhibits no tenderness.  Abdominal: He exhibits no distension.  Musculoskeletal: He exhibits no deformity.  Lymphadenopathy:    He has cervical adenopathy.  Neurological: He is alert.  Skin: Skin is warm and dry. He is not diaphoretic.  Psychiatric: He has a normal mood and affect.  Nursing note and vitals reviewed.    ED Treatments / Results  DIAGNOSTIC STUDIES:  Oxygen Saturation is 100% on RA, normal by my interpretation.    COORDINATION OF CARE:  3:51 PM Discussed treatment plan with pt at bedside and pt agreed to plan.   Labs (all labs ordered are listed, but only abnormal results are displayed) Labs Reviewed - No data  to display  EKG  EKG Interpretation None       Radiology No results found.  Procedures Procedures (including critical care time)  Medications Ordered in ED Medications  magic mouthwash (5 mLs Oral Given 04/11/17 1658)     Initial Impression / Assessment and Plan / ED Course  I have reviewed the triage vital signs and the nursing notes.  Pertinent labs & imaging results that were available during my care of the patient were reviewed by me and considered in my medical decision making (see chart for details).  Patient with dentalgia.  No abscess requiring immediate incision and drainage.  Exam not concerning for Ludwig's angina or pharyngeal abscess.  Will treat with clindamycin given that patient just was treated with Augmentin but did not complete the course appropriately. Pt instructed to follow-up with dentist and given referral with Dr. Geralynn Ochs who is on call today.  Urged patient to follow up first thing in the morning.  Discussed strict return precautions and advised to return to the emergency department if experiencing any new or worsening symptoms. Instructions were understood and patient agreed with discharge plan.    Final Clinical Impressions(s) / ED Diagnoses   Final diagnoses:  Pain, dental    New Prescriptions Discharge Medication List as of 04/11/2017  4:23 PM    START taking these medications   Details  clindamycin (CLEOCIN) 150 MG capsule Take 3 capsules (450 mg total) by mouth 3 (three) times daily., Starting Mon 04/11/2017, Until Mon 04/18/2017, Print    magic mouthwash SOLN Take 5 mLs by mouth daily., Starting Mon 04/11/2017, Print      I personally performed the services described in this documentation, which was scribed in my presence. The recorded information has been reviewed and is accurate.    Emeline General, PA-C 04/11/17  [id number]    Davonna Belling, MD 04/11/17 2007

## 2017-04-11 NOTE — Discharge Instructions (Signed)
As discussed, follow up with the dentist listed on his discharge summary. Used a mouthwash in the meantime and take antibiotics as prescribed and if you feel better. Ibuprofen for pain and swelling.  Return if you experience difficulty swallowing, increased swelling, difficulty breathing or any other new concerning symptoms in the meantime.

## 2017-04-13 ENCOUNTER — Other Ambulatory Visit: Payer: Self-pay | Admitting: *Deleted

## 2017-04-13 DIAGNOSIS — K029 Dental caries, unspecified: Secondary | ICD-10-CM

## 2017-04-13 MED FILL — CLOPIDOGREL 75 MG TABLET: 75 | 30 days supply | Qty: 30 | Fill #3

## 2017-04-13 MED FILL — PANTOPRAZOLE SOD DR 40 MG T: 40 | 30 days supply | Qty: 30 | Fill #1

## 2017-04-13 MED FILL — FLUoxetine HCL 20 MG CAPS: 20 | 30 days supply | Qty: 30 | Fill #3

## 2017-04-13 MED FILL — AMLODIPINE BESYLATE 10 MG T: 10 | 30 days supply | Qty: 30 | Fill #3

## 2017-04-13 MED FILL — traZODone HCL 50 MG TABS: 50 | 30 days supply | Qty: 30 | Fill #2

## 2017-04-13 NOTE — Progress Notes (Signed)
ED Referral with Disclaimer  Andrew West was seen in the ED and was advised to follow up with you as necessary.  It is the sole responsibility of the patient to arrange for an appointment with you or your practice.  This notification is no way meant to establish a doctor-patient relationship.

## 2017-04-14 MED FILL — HYDROCORT-PRAMOXINE 2.5-1%: 2.5-1 | 10 days supply | Qty: 30 | Fill #0

## 2017-04-19 ENCOUNTER — Other Ambulatory Visit: Payer: Self-pay | Admitting: Internal Medicine

## 2017-05-02 ENCOUNTER — Telehealth: Payer: Self-pay | Admitting: Family Medicine

## 2017-05-02 DIAGNOSIS — M5116 Intervertebral disc disorders with radiculopathy, lumbar region: Secondary | ICD-10-CM

## 2017-05-02 NOTE — Telephone Encounter (Signed)
Patient is requesting prescription refill for Tylenol 3...  Please advised

## 2017-05-04 NOTE — Telephone Encounter (Signed)
He received a prescription for 60 pills on 03/25/17 with 1 refill which should last him up until 05/25/17.

## 2017-05-06 NOTE — Telephone Encounter (Signed)
Contacted pt to make aware of his tyleno 3 rx  And he shoould have a refill pt states he understands and doesn't have any questions

## 2017-05-09 ENCOUNTER — Emergency Department (HOSPITAL_COMMUNITY): Payer: BC Managed Care – PPO

## 2017-05-09 ENCOUNTER — Telehealth: Payer: Self-pay | Admitting: Internal Medicine

## 2017-05-09 ENCOUNTER — Encounter (HOSPITAL_COMMUNITY): Payer: Self-pay | Admitting: *Deleted

## 2017-05-09 ENCOUNTER — Inpatient Hospital Stay (HOSPITAL_COMMUNITY)
Admission: EM | Admit: 2017-05-09 | Discharge: 2017-05-10 | DRG: 302 | Disposition: A | Discharge status: Home / Self Care | Payer: BC Managed Care – PPO | Attending: Internal Medicine | Admitting: Internal Medicine

## 2017-05-09 DIAGNOSIS — I200 Unstable angina: Secondary | ICD-10-CM | POA: Diagnosis present

## 2017-05-09 DIAGNOSIS — I110 Hypertensive heart disease with heart failure: Secondary | ICD-10-CM | POA: Diagnosis present

## 2017-05-09 DIAGNOSIS — I5021 Acute systolic (congestive) heart failure: Secondary | ICD-10-CM | POA: Diagnosis present

## 2017-05-09 DIAGNOSIS — Z79899 Other long term (current) drug therapy: Secondary | ICD-10-CM

## 2017-05-09 DIAGNOSIS — R079 Chest pain, unspecified: Secondary | ICD-10-CM | POA: Diagnosis not present

## 2017-05-09 DIAGNOSIS — E785 Hyperlipidemia, unspecified: Secondary | ICD-10-CM | POA: Diagnosis present

## 2017-05-09 DIAGNOSIS — E119 Type 2 diabetes mellitus without complications: Secondary | ICD-10-CM | POA: Diagnosis present

## 2017-05-09 DIAGNOSIS — I25110 Atherosclerotic heart disease of native coronary artery with unstable angina pectoris: Principal | ICD-10-CM | POA: Diagnosis present

## 2017-05-09 DIAGNOSIS — Z951 Presence of aortocoronary bypass graft: Secondary | ICD-10-CM

## 2017-05-09 DIAGNOSIS — Z7984 Long term (current) use of oral hypoglycemic drugs: Secondary | ICD-10-CM

## 2017-05-09 DIAGNOSIS — Z87891 Personal history of nicotine dependence: Secondary | ICD-10-CM

## 2017-05-09 DIAGNOSIS — J449 Chronic obstructive pulmonary disease, unspecified: Secondary | ICD-10-CM | POA: Diagnosis present

## 2017-05-09 DIAGNOSIS — I509 Heart failure, unspecified: Secondary | ICD-10-CM

## 2017-05-09 LAB — CBC
HEMATOCRIT: 39.9 % (ref 39.0–52.0)
HEMOGLOBIN: 13.1 g/dL (ref 13.0–17.0)
MCH: 26.6 pg (ref 26.0–34.0)
MCHC: 32.8 g/dL (ref 30.0–36.0)
MCV: 81.1 fL (ref 78.0–100.0)
Platelets: 227 10*3/uL (ref 150–400)
RBC: 4.92 MIL/uL (ref 4.22–5.81)
RDW: 13.7 % (ref 11.5–15.5)
WBC: 8.6 10*3/uL (ref 4.0–10.5)

## 2017-05-09 LAB — BASIC METABOLIC PANEL
ANION GAP: 8 (ref 5–15)
BUN: 10 mg/dL (ref 6–20)
CO2: 24 mmol/L (ref 22–32)
Calcium: 8.7 mg/dL — ABNORMAL LOW (ref 8.9–10.3)
Chloride: 107 mmol/L (ref 101–111)
Creatinine, Ser: 0.85 mg/dL (ref 0.61–1.24)
GFR calc non Af Amer: >60 mL/min (ref >60)
Glucose, Bld: 122 mg/dL — ABNORMAL HIGH (ref 65–99)
POTASSIUM: 3.5 mmol/L (ref 3.5–5.1)
Sodium: 139 mmol/L (ref 135–145)

## 2017-05-09 LAB — BRAIN NATRIURETIC PEPTIDE: B NATRIURETIC PEPTIDE 5: 36.7 pg/mL (ref 0.0–100.0)

## 2017-05-09 LAB — I-STAT TROPONIN, ED: Troponin i, poc: 0 ng/mL (ref 0.00–0.08)

## 2017-05-09 MED ORDER — ASPIRIN 325 MG PO TABS
325.0000 mg | ORAL_TABLET | Freq: Every day | ORAL | Status: DC
  Administered 2017-05-10: 325 mg via ORAL
  Filled 2017-05-09: qty 1

## 2017-05-09 MED ORDER — ACETAMINOPHEN 500 MG PO TABS
1000.0000 mg | ORAL_TABLET | Freq: Once | ORAL | Status: AC
  Administered 2017-05-09: 1000 mg via ORAL
  Filled 2017-05-09: qty 2

## 2017-05-09 MED ORDER — FUROSEMIDE 10 MG/ML IJ SOLN
40.0000 mg | Freq: Once | INTRAMUSCULAR | Status: AC
  Administered 2017-05-10: 40 mg via INTRAVENOUS
  Filled 2017-05-09: qty 4

## 2017-05-09 MED ORDER — FENTANYL CITRATE (PF) 100 MCG/2ML IJ SOLN
50.0000 ug | Freq: Once | INTRAMUSCULAR | Status: AC
  Administered 2017-05-10: 50 ug via INTRAVENOUS
  Filled 2017-05-09: qty 2

## 2017-05-09 MED ORDER — NITROGLYCERIN IN D5W 200-5 MCG/ML-% IV SOLN
0.0000 ug/min | Freq: Once | INTRAVENOUS | Status: AC
  Administered 2017-05-09: 20 ug/min via INTRAVENOUS
  Filled 2017-05-09: qty 250

## 2017-05-09 NOTE — ED Triage Notes (Signed)
Pt to ED from home by EMS c/o sudden onset of chest pain and shortness of breath. Pt reports a cough today. Pt has taken nitro x 2 and 324mg  ASA. EMS attempted CPAP, pt reports it helped with pain but unable to tolerate. Rales noted in upper lobes. Hx of 8 stents, last cath 4 months ago due to chest pain

## 2017-05-09 NOTE — Telephone Encounter (Signed)
[facility] After Hours Note  Pt with a hx of CAD s/p PTCA who is calling to state that his left side keeps going numb, he has chest pain, and is short of breath.  I advised that he immediately call EMS and be transported to his nearest hospital.  He voiced understanding and willingness to comply.   Regino Bellow

## 2017-05-09 NOTE — ED Provider Notes (Signed)
[facility] DEPT Provider Note   CSN: [id number] Arrival date & time: 05/09/17  2132     History   Chief Complaint Chief Complaint  Patient presents with  . Chest Pain  . Shortness of Breath    HPI Andrew West is a 51 y.o. male.  HPI  Patient is a 51 year old male with past medical history significant for CAD (multiple stents), CHF, who presents to the emergency department with chest pain. Onset 3 hours prior to arrival. Describes substernal chest pressure, radiates to the left arm. Nothing worsens the pain. Improved for a couple of minutes with nitroglycerin but then returns. Rates 10/10. Associated with diaphoresis, nausea, shortness of breath. Denies prior episodes PE/DVT. Mild swelling to his bilateral feet. No recent cough or congestion.  Past Medical History:  Diagnosis Date  . Anxiety   . CAD (coronary artery disease)    a. s/p multiple PCIs with last cath 11/2016 with severe multivessel CAD, s/p PCTA to LCx but unable to pass stent  . Chronic leg pain    bilateral  . Chronic lower back pain   . COPD (chronic obstructive pulmonary disease) ([place])   . Depression   . GERD (gastroesophageal reflux disease)    Takes Dexilant  . HLD (hyperlipidemia)   . Hypertension   . Rhabdomyolysis    h/o, r/t statins  . Sleep apnea    "can't tolerate mask" (12/16/2016)  . Type II diabetes mellitus [facility])     Patient Active Problem List   Diagnosis Date Noted  . Complex regional pain syndrome type I 03/24/2017  . Progressive angina ([place]) -Class III 12/16/2016  . Anxiety 06/28/2016  . Diastolic dysfunction-grade 2 with EF 60-65% Oct 2016 03/22/2016  . Hypertension 10/03/2015  . CAD S/P multiple PCI's 10/03/2015  . Pulmonary nodule 06/24/2015  . Cough 06/24/2015  . COPD with asthma ([place]) 06/24/2015  . Reactive airway disease 06/04/2015  . DOE (dyspnea on exertion) 04/15/2015  . Lumbar disc herniation with radiculopathy 03/31/2012  . Presence of stent in left circumflex  coronary artery 10/18/2011    Class: History of  . TOBACCO ABUSE 02/27/2009  . ABDOMINAL PAIN, LEFT LOWER QUADRANT 12/30/2008  . ABDOMINAL PAIN, EPIGASTRIC 12/05/2008  . Depression 09/05/2008  . Hereditary and idiopathic peripheral neuropathy 09/05/2008  . GERD 09/05/2008  . Cervical disc disorder with radiculopathy of cervical region 08/19/2008  . HLD (hyperlipidemia) 04/25/2008    Class: Diagnosis of  . ALLERGIC RHINITIS 04/25/2008  . Backache 04/25/2008  . Chest pain, unspecified 04/25/2008  . COLONIC POLYPS, HX OF 04/25/2008  . Obesity 04/18/2008    Class: Diagnosis of  . ANAL FISSURE, HX OF 04/18/2008  . Diabetes mellitus ([place]) 01/30/2008    Class: History of  . RECTAL BLEEDING 01/30/2008    Past Surgical History:  Procedure Laterality Date  . BACK SURGERY    . CARDIAC CATHETERIZATION N/A 09/25/2015   Procedure: Left Heart Cath and Coronary Angiography;  Surgeon: Leonie Man, MD;  Location: [facility] CV LAB;  Service: Cardiovascular;  Laterality: N/A;  . CARDIAC CATHETERIZATION N/A 12/16/2016   Procedure: Left Heart Cath and Coronary Angiography;  Surgeon: Leonie Man, MD;  Location: [facility] CV LAB;  Service: Cardiovascular;  Laterality: N/A;  . CARDIAC CATHETERIZATION N/A 12/16/2016   Procedure: Coronary Balloon Angioplasty;  Surgeon: Leonie Man, MD;  Location: [facility] CV LAB;  Service: Cardiovascular;  Laterality: N/A;  . COLONOSCOPY W/ POLYPECTOMY    . CORONARY ANGIOPLASTY  09/25/2015  mid cir & om  . CORONARY ANGIOPLASTY WITH STENT PLACEMENT  10/09/2001   PTCA & stenting of mid AV circumflex; 2.5x66mm Pixel stent  . CORONARY ANGIOPLASTY WITH STENT PLACEMENT  12/13/2001   PCI with stent to mid L circumflex, 95% stenosis to 0% residual  . CORONARY ANGIOPLASTY WITH STENT PLACEMENT  10/10/2003   PCI to mid AV circumflex; LAD 30% disease; RCA 100% occluded prox.  . CORONARY ANGIOPLASTY WITH STENT PLACEMENT  09/01/2011   PCI with stenting with bare  metal stent to mid AV groove circumflex and PDA  . CORONARY ANGIOPLASTY WITH STENT PLACEMENT  10/17/2011   cutting balloon angioplasty of ostial lateral OM1 branch and bifurcation AV groove circumflex OM junction; stenosis reduced to 0%  . EXCISIONAL HEMORRHOIDECTOMY    . LEFT HEART CATHETERIZATION WITH CORONARY ANGIOGRAM N/A 10/18/2011   Procedure: LEFT HEART CATHETERIZATION WITH CORONARY ANGIOGRAM;  Surgeon: Leonie Man, MD;  Location: [facility] CATH LAB;  Service: Cardiovascular;  Laterality: N/A;  . LUMBAR LAMINECTOMY/DECOMPRESSION MICRODISCECTOMY  03/31/2012   Procedure: LUMBAR LAMINECTOMY/DECOMPRESSION MICRODISCECTOMY 1 LEVEL;  Surgeon: Charlie Pitter, MD;  Location: [facility] NEURO ORS;  Service: Neurosurgery;  Laterality: Left;  . TRANSTHORACIC ECHOCARDIOGRAM  07/28/2011   EF 55-65%; LVH, grade 1 diastolic dysfunction;        Home Medications    Prior to Admission medications   Medication Sig Start Date End Date Taking? Authorizing Provider  acetaminophen (TYLENOL) 500 MG tablet Take 1,000 mg by mouth every 6 (six) hours as needed for headache.   Yes [provider]  Albuterol Sulfate (PROAIR RESPICLICK) 993 (90 Base) MCG/ACT AEPB Inhale 1 puff into the lungs every 6 (six) hours as needed (shortness of breath). 04/08/17  Yes Arnoldo Morale, MD  amLODipine (NORVASC) 10 MG tablet Take 1 tablet (10 mg total) by mouth daily. 04/08/17 05/09/17 Yes Arnoldo Morale, MD  aspirin 81 MG tablet Take 1 tablet (81 mg total) by mouth daily. 03/23/16  Yes Eileen Stanford, PA-C  atorvastatin (LIPITOR) 40 MG tablet Take 1 tablet (40 mg total) by mouth daily. 04/08/17  Yes Arnoldo Morale, MD  benzonatate (TESSALON) 200 MG capsule Take 200 mg by mouth daily. 02/24/17  Yes [provider]  cetirizine (ZYRTEC) 10 MG tablet Take 1 tablet (10 mg total) by mouth daily. 04/08/17  Yes Arnoldo Morale, MD  clopidogrel (PLAVIX) 75 MG tablet Take 1 tablet (75 mg total) by mouth daily. 04/08/17  Yes Arnoldo Morale, MD    FLUoxetine (PROZAC) 20 MG tablet Take 1 tablet (20 mg total) by mouth daily. 04/08/17  Yes Arnoldo Morale, MD  fluticasone (FLONASE) 50 MCG/ACT nasal spray Place 2 sprays into both nostrils daily. 02/24/17  Yes Freeman Caldron M, PA-C  furosemide (LASIX) 20 MG tablet Take 20 mg by mouth 2 (two) times daily.   Yes [provider]  hydrocortisone-pramoxine (ANALPRAM HC) 2.5-1 % rectal cream Place 1 application rectally 3 (three) times daily. 04/08/17  Yes Arnoldo Morale, MD  isosorbide mononitrate (IMDUR) 30 MG 24 hr tablet Take 90mg  (3 tablets) in the morning and 60mg  (2 tablets) in the PM 12/24/16  Yes Strader, [place] M, PA-C  magic mouthwash SOLN Take 5 mLs by mouth daily. 04/11/17  Yes [name] B, PA-C  metFORMIN (GLUCOPHAGE) 1000 MG tablet Take 1 tablet (1,000 mg total) by mouth 2 (two) times daily with a meal. 04/08/17  Yes Amao, Enobong, MD  mometasone (ASMANEX) 220 MCG/INH inhaler Inhale 2 puffs into the lungs daily. 04/08/17  Yes Amao,  Enobong, MD  Nebivolol HCl 20 MG TABS Take 1 tablet (20 mg total) by mouth daily. 11/08/16  Yes Hilty, Nadean Corwin, MD  nitroGLYCERIN (NITROSTAT) 0.4 MG SL tablet Place 1 tablet (0.4 mg total) under the tongue every 5 (five) minutes as needed for chest pain. 12/24/16  Yes Strader, [place] M, PA-C  pantoprazole (PROTONIX) 40 MG tablet Take 1 tablet (40 mg total) by mouth daily. 04/08/17  Yes Arnoldo Morale, MD  pregabalin (LYRICA) 75 MG capsule Take 1 capsule (75 mg total) by mouth 3 (three) times daily. 03/24/17  Yes Hilty, Nadean Corwin, MD  RANEXA 1000 MG SR tablet TAKE 1 TABLET (1,000 MG TOTAL) BY MOUTH 2 (TWO) TIMES DAILY. 04/19/17  Yes Hilty, Nadean Corwin, MD  tiotropium (SPIRIVA HANDIHALER) 18 MCG inhalation capsule Place 1 capsule (18 mcg total) into inhaler and inhale daily. 04/08/17  Yes Arnoldo Morale, MD  traZODone (DESYREL) 50 MG tablet Take 1 tablet (50 mg total) by mouth at bedtime as needed for sleep. 03/15/17  Yes Amao, Charlane Ferretti, MD   valsartan-hydrochlorothiazide (DIOVAN HCT) 320-25 MG tablet Take 1 tablet by mouth daily. 04/08/17  Yes Arnoldo Morale, MD  acetaminophen-codeine (TYLENOL #3) 300-30 MG tablet Take 1 tablet by mouth every 12 (twelve) hours as needed for moderate pain. Patient not taking: Reported on 05/09/2017 03/25/17   Arnoldo Morale, MD    Family History Family History  Problem Relation Age of Onset  . Heart attack Father   . Hypertension Mother   . Diabetes Mother   . Heart disease Brother        x 3   . Heart attack Brother        deceased  . Hypertension Sister   . Diabetes Sister   . Anesthesia problems Neg Hx   . Hypotension Neg Hx   . Malignant hyperthermia Neg Hx   . Pseudochol deficiency Neg Hx     Social History Social History  Substance Use Topics  . Smoking status: Former Smoker    Packs/day: 0.25    Years: 25.00    Types: Cigarettes    Quit date: 05/24/2015  . Smokeless tobacco: Never Used  . Alcohol use No     Allergies   Iohexol   Review of Systems Review of Systems  Constitutional: Positive for diaphoresis. Negative for appetite change and fever.  HENT: Negative for congestion and trouble swallowing.   Eyes: Negative for visual disturbance.  Respiratory: Positive for chest tightness and shortness of breath.   Cardiovascular: Positive for chest pain and leg swelling. Negative for palpitations.  Gastrointestinal: Positive for nausea. Negative for abdominal pain, blood in stool and vomiting.  Genitourinary: Negative for decreased urine volume and dysuria.  Musculoskeletal: Negative for back pain.  Skin: Negative for rash.  Neurological: Negative for dizziness, syncope, weakness and light-headedness.  Psychiatric/Behavioral: Negative for behavioral problems.     Physical Exam Updated Vital Signs BP 121/89   Pulse 78   Temp 97.7 F (36.5 C)   Resp 18   Ht 5\' 8"  (1.727 m)   Wt 118.4 kg (261 lb)   SpO2 96%   BMI 39.68 kg/m   Physical Exam  Constitutional: He  is oriented to person, place, and time. He appears well-developed and well-nourished. He appears distressed.  Ill appearing  HENT:  Head: Atraumatic.  Mouth/Throat: Oropharynx is clear and moist.  Eyes: Conjunctivae and EOM are normal.  Neck: Normal range of motion. No JVD present.  Cardiovascular: Normal rate, regular rhythm, normal heart sounds  and intact distal pulses.   No murmur heard. Pulmonary/Chest: No respiratory distress.  Tachypnea. Faint crackles bilateral lower lung fields. Mild expiratory wheeze throughout.  Abdominal: Soft. He exhibits no distension and no mass. There is no tenderness. There is no guarding.  Musculoskeletal: Normal range of motion. He exhibits no edema.  No unilateral leg swelling  Neurological: He is alert and oriented to person, place, and time.  Skin: Skin is warm.  Psychiatric: He has a normal mood and affect.     ED Treatments / Results  Labs (all labs ordered are listed, but only abnormal results are displayed) Labs Reviewed  BASIC METABOLIC PANEL - Abnormal; Notable for the following:       Result Value   Glucose, Bld 122 (*)    Calcium 8.7 (*)    All other components within normal limits  CBC  BRAIN NATRIURETIC PEPTIDE  I-STAT TROPOININ, ED    EKG  EKG Interpretation  Date/Time:  Monday May 09 2017 21:38:05 EDT Ventricular Rate:  76 PR Interval:    QRS Duration: 111 QT Interval:  408 QTC Calculation: 459 R Axis:   -34 Text Interpretation:  Sinus rhythm S1,S2,S3 pattern Minimal ST elevation, anterior leads No significant change since last tracing Confirmed by Merrily Pew [phone]) on 05/09/2017 9:47:26 PM       Radiology Dg Chest Portable 1 View  Result Date: 05/09/2017 CLINICAL DATA:  Initial evaluation for acute shortness of breath. EXAM: PORTABLE CHEST 1 VIEW COMPARISON:  Prior radiograph from 12/13/2016. FINDINGS: Mild cardiomegaly, stable. Mediastinal silhouette within normal limits. Lungs normally inflated. Diffuse  pulmonary vascular congestion with interstitial prominence, suggesting mild pulmonary interstitial edema. No focal infiltrates. No pleural effusion. No pneumothorax. No acute osseus abnormality. IMPRESSION: Cardiomegaly with mild diffuse vascular congestion and pulmonary interstitial edema. Electronically Signed   By: [name]   On: 05/09/2017 21:56    Procedures Procedures (including critical care time)  Medications Ordered in ED Medications  aspirin tablet 325 mg (not administered)  furosemide (LASIX) injection 40 mg (not administered)  nitroGLYCERIN 50 mg in dextrose 5 % 250 mL (0.2 mg/mL) infusion (90 mcg/min Intravenous Rate/Dose Change 05/09/17 2340)  acetaminophen (TYLENOL) tablet 1,000 mg (1,000 mg Oral Given 05/09/17 2306)     Initial Impression / Assessment and Plan / ED Course  I have reviewed the triage vital signs and the nursing notes.  Pertinent labs & imaging results that were available during my care of the patient were reviewed by me and considered in my medical decision making (see chart for details).     Patient is a 51 year old male past medical history significant for coronary artery disease, CHF, who presents to the emergency department with typical chest pain associated with shortness of breath. Catheterization 01/18 showed extensive coronary artery disease, unable to place a stent. Did not feel that the patient was a good bypass candidate. On arrival in obvious distress, ill appearing. Afebrile, hemodynamically stable.  Clinical picture concerning for ACS versus CHF exacerbation. Given aspirin. EKG showed normal sinus rhythm, mild worsening ST elevation of V3, no reciprocal changes, normal intervals. No signs of acute ischemia. Chest x-ray showed cardiomegaly with pulmonary edema. Initial troponin undetectable. No significant electrolyte abnormalities. BNP 36. Clinically concerning for CHF exacerbation, given lasix 40 IV.   Patient given aspirin and  started on a nitroglycerin infusion. Cardiology consulted for admission for ACS and CHF exacerbation.   Final Clinical Impressions(s) / ED Diagnoses   Final diagnoses:  Chest pain, unspecified type  Acute on chronic congestive heart failure, unspecified heart failure type [facility])    New Prescriptions New Prescriptions   No medications on file     Nathaniel Man, MD 05/09/17 2346    Mesner, Corene Cornea, MD 05/11/17 2040

## 2017-05-10 ENCOUNTER — Telehealth: Payer: Self-pay | Admitting: Internal Medicine

## 2017-05-10 DIAGNOSIS — I200 Unstable angina: Secondary | ICD-10-CM | POA: Diagnosis not present

## 2017-05-10 DIAGNOSIS — Z87891 Personal history of nicotine dependence: Secondary | ICD-10-CM | POA: Diagnosis not present

## 2017-05-10 DIAGNOSIS — E785 Hyperlipidemia, unspecified: Secondary | ICD-10-CM | POA: Diagnosis present

## 2017-05-10 DIAGNOSIS — R079 Chest pain, unspecified: Secondary | ICD-10-CM | POA: Diagnosis present

## 2017-05-10 DIAGNOSIS — Z951 Presence of aortocoronary bypass graft: Secondary | ICD-10-CM | POA: Diagnosis not present

## 2017-05-10 DIAGNOSIS — I110 Hypertensive heart disease with heart failure: Secondary | ICD-10-CM | POA: Diagnosis present

## 2017-05-10 DIAGNOSIS — J449 Chronic obstructive pulmonary disease, unspecified: Secondary | ICD-10-CM | POA: Diagnosis present

## 2017-05-10 DIAGNOSIS — Z79899 Other long term (current) drug therapy: Secondary | ICD-10-CM | POA: Diagnosis not present

## 2017-05-10 DIAGNOSIS — I2510 Atherosclerotic heart disease of native coronary artery without angina pectoris: Secondary | ICD-10-CM

## 2017-05-10 DIAGNOSIS — I5021 Acute systolic (congestive) heart failure: Secondary | ICD-10-CM | POA: Diagnosis present

## 2017-05-10 DIAGNOSIS — I509 Heart failure, unspecified: Secondary | ICD-10-CM

## 2017-05-10 DIAGNOSIS — E119 Type 2 diabetes mellitus without complications: Secondary | ICD-10-CM | POA: Diagnosis present

## 2017-05-10 DIAGNOSIS — Z7984 Long term (current) use of oral hypoglycemic drugs: Secondary | ICD-10-CM | POA: Diagnosis not present

## 2017-05-10 DIAGNOSIS — I25110 Atherosclerotic heart disease of native coronary artery with unstable angina pectoris: Secondary | ICD-10-CM | POA: Diagnosis present

## 2017-05-10 LAB — HEPARIN LEVEL (UNFRACTIONATED): Heparin Unfractionated: 0.42 IU/mL (ref 0.30–0.70)

## 2017-05-10 LAB — TROPONIN I
Troponin I: <0.03 ng/mL (ref <0.03)
Troponin I: <0.03 ng/mL (ref <0.03)

## 2017-05-10 LAB — GLUCOSE, CAPILLARY
GLUCOSE-CAPILLARY: 111 mg/dL — AB (ref 65–99)
Glucose-Capillary: 89 mg/dL (ref 65–99)

## 2017-05-10 LAB — CBC
HEMATOCRIT: 37.9 % — AB (ref 39.0–52.0)
HEMOGLOBIN: 12.2 g/dL — AB (ref 13.0–17.0)
MCH: 26.5 pg (ref 26.0–34.0)
MCHC: 32.2 g/dL (ref 30.0–36.0)
MCV: 82.2 fL (ref 78.0–100.0)
Platelets: 217 10*3/uL (ref 150–400)
RBC: 4.61 MIL/uL (ref 4.22–5.81)
RDW: 13.8 % (ref 11.5–15.5)
WBC: 7.9 10*3/uL (ref 4.0–10.5)

## 2017-05-10 LAB — BASIC METABOLIC PANEL
Anion gap: 6 (ref 5–15)
BUN: 11 mg/dL (ref 6–20)
CO2: 28 mmol/L (ref 22–32)
Calcium: 8.3 mg/dL — ABNORMAL LOW (ref 8.9–10.3)
Chloride: 105 mmol/L (ref 101–111)
Creatinine, Ser: 0.9 mg/dL (ref 0.61–1.24)
Glucose, Bld: 110 mg/dL — ABNORMAL HIGH (ref 65–99)
POTASSIUM: 3.8 mmol/L (ref 3.5–5.1)
SODIUM: 139 mmol/L (ref 135–145)

## 2017-05-10 LAB — CBG MONITORING, ED: Glucose-Capillary: 122 mg/dL — ABNORMAL HIGH (ref 65–99)

## 2017-05-10 LAB — MRSA PCR SCREENING: MRSA by PCR: NEGATIVE

## 2017-05-10 LAB — HIV ANTIBODY (ROUTINE TESTING W REFLEX): HIV SCREEN 4TH GENERATION: NONREACTIVE

## 2017-05-10 MED ORDER — PREGABALIN 25 MG PO CAPS
75.0000 mg | ORAL_CAPSULE | Freq: Three times a day (TID) | ORAL | Status: DC
Start: 2017-05-10 — End: 2017-05-10
  Administered 2017-05-10: 11:00:00 75 mg via ORAL
  Filled 2017-05-10: qty 1

## 2017-05-10 MED ORDER — ATORVASTATIN CALCIUM 40 MG PO TABS: 40.0000 mg | ORAL_TABLET | Freq: Every day | ORAL | Status: DC

## 2017-05-10 MED ORDER — FUROSEMIDE 20 MG PO TABS
20.0000 mg | ORAL_TABLET | Freq: Two times a day (BID) | ORAL | Status: DC
  Administered 2017-05-10: 20 mg via ORAL
  Filled 2017-05-10: qty 1

## 2017-05-10 MED ORDER — ASPIRIN 81 MG PO TABS: 81.0000 mg | ORAL_TABLET | Freq: Every day | ORAL | Status: DC

## 2017-05-10 MED ORDER — HEPARIN BOLUS VIA INFUSION
4000.0000 [IU] | Freq: Once | INTRAVENOUS | Status: AC
  Administered 2017-05-10: 4000 [IU] via INTRAVENOUS
  Filled 2017-05-10: qty 4000

## 2017-05-10 MED ORDER — NEBIVOLOL HCL 10 MG PO TABS
20.0000 mg | ORAL_TABLET | Freq: Every day | ORAL | Status: DC
  Administered 2017-05-10: 20 mg via ORAL
  Filled 2017-05-10: qty 4
  Filled 2017-05-10: qty 2

## 2017-05-10 MED ORDER — TRAZODONE HCL 50 MG PO TABS
50.0000 mg | ORAL_TABLET | Freq: Every evening | ORAL | Status: DC | PRN
  Administered 2017-05-10: 50 mg via ORAL
  Filled 2017-05-10: qty 1

## 2017-05-10 MED ORDER — FLUOXETINE HCL 20 MG PO CAPS
20.0000 mg | ORAL_CAPSULE | Freq: Every day | ORAL | Status: DC
  Administered 2017-05-10: 20 mg via ORAL
  Filled 2017-05-10: qty 1

## 2017-05-10 MED ORDER — MORPHINE SULFATE (PF) 4 MG/ML IV SOLN
2.0000 mg | INTRAVENOUS | Status: DC | PRN
  Administered 2017-05-10 (×2): 2 mg via INTRAVENOUS
  Filled 2017-05-10 (×2): qty 1

## 2017-05-10 MED ORDER — PANTOPRAZOLE SODIUM 40 MG PO TBEC
40.0000 mg | DELAYED_RELEASE_TABLET | Freq: Every day | ORAL | Status: DC
  Administered 2017-05-10: 40 mg via ORAL
  Filled 2017-05-10: qty 1

## 2017-05-10 MED ORDER — RANOLAZINE ER 500 MG PO TB12
1000.0000 mg | ORAL_TABLET | Freq: Two times a day (BID) | ORAL | Status: DC
  Administered 2017-05-10 (×2): 1000 mg via ORAL
  Filled 2017-05-10 (×2): qty 2

## 2017-05-10 MED ORDER — NITROGLYCERIN IN D5W 200-5 MCG/ML-% IV SOLN: 0.0000 ug/min | INTRAVENOUS | Status: DC

## 2017-05-10 MED ORDER — ALBUTEROL SULFATE (2.5 MG/3ML) 0.083% IN NEBU: 2.5000 mg | INHALATION_SOLUTION | Freq: Four times a day (QID) | RESPIRATORY_TRACT | Status: DC | PRN

## 2017-05-10 MED ORDER — INSULIN ASPART 100 UNIT/ML ~~LOC~~ SOLN: 0.0000 [IU] | Freq: Every day | SUBCUTANEOUS | Status: DC

## 2017-05-10 MED ORDER — BENZONATATE 100 MG PO CAPS
200.0000 mg | ORAL_CAPSULE | Freq: Every day | ORAL | Status: DC
  Administered 2017-05-10: 200 mg via ORAL
  Filled 2017-05-10: qty 2

## 2017-05-10 MED ORDER — LORATADINE 10 MG PO TABS
10.0000 mg | ORAL_TABLET | Freq: Every day | ORAL | Status: DC
  Administered 2017-05-10: 10 mg via ORAL
  Filled 2017-05-10: qty 1

## 2017-05-10 MED ORDER — ONDANSETRON HCL 4 MG/2ML IJ SOLN: 4.0000 mg | Freq: Four times a day (QID) | INTRAMUSCULAR | Status: DC | PRN

## 2017-05-10 MED ORDER — ACETAMINOPHEN 325 MG PO TABS
650.0000 mg | ORAL_TABLET | ORAL | Status: DC | PRN
  Administered 2017-05-10: 650 mg via ORAL
  Filled 2017-05-10: qty 2

## 2017-05-10 MED ORDER — TIOTROPIUM BROMIDE MONOHYDRATE 18 MCG IN CAPS
18.0000 ug | ORAL_CAPSULE | Freq: Every day | RESPIRATORY_TRACT | Status: DC
  Administered 2017-05-10: 18 ug via RESPIRATORY_TRACT
  Filled 2017-05-10: qty 5

## 2017-05-10 MED ORDER — FLUTICASONE PROPIONATE 50 MCG/ACT NA SUSP
2.0000 | Freq: Every day | NASAL | Status: DC
  Administered 2017-05-10: 2 via NASAL
  Filled 2017-05-10: qty 16

## 2017-05-10 MED ORDER — CLOPIDOGREL BISULFATE 75 MG PO TABS
75.0000 mg | ORAL_TABLET | Freq: Every day | ORAL | Status: DC
  Administered 2017-05-10: 75 mg via ORAL
  Filled 2017-05-10: qty 1

## 2017-05-10 MED ORDER — VALSARTAN-HYDROCHLOROTHIAZIDE 320-25 MG PO TABS: 1.0000 | ORAL_TABLET | Freq: Every day | ORAL | Status: DC

## 2017-05-10 MED ORDER — HEPARIN (PORCINE) IN NACL 100-0.45 UNIT/ML-% IJ SOLN
1300.0000 [IU]/h | INTRAMUSCULAR | Status: DC
Start: 2017-05-10 — End: 2017-05-10
  Administered 2017-05-10: 1300 [IU]/h via INTRAVENOUS
  Filled 2017-05-10: qty 250

## 2017-05-10 MED ORDER — BUDESONIDE 0.25 MG/2ML IN SUSP
0.2500 mg | Freq: Two times a day (BID) | RESPIRATORY_TRACT | Status: DC
  Administered 2017-05-10: 0.25 mg via RESPIRATORY_TRACT
  Filled 2017-05-10: qty 2

## 2017-05-10 MED ORDER — INSULIN ASPART 100 UNIT/ML ~~LOC~~ SOLN: 0.0000 [IU] | Freq: Three times a day (TID) | SUBCUTANEOUS | Status: DC

## 2017-05-10 NOTE — H&P (Signed)
CARDIOLOGY H&P  HPI: 51 yo male with a pmhx of multivessel CAD s/p multiple PCIs in the past (most recently attempted in 11/2016 but unable to pass stent), HTN, OSA, DM2, HLD, and obesity who presents to the ED with complaints of chest pain and SOB. Pt states that his symptoms began this evening around 2000.  He states that the pain started as tingling in his left arm, and then moved into his neck and was followed by severe, SS chest pain which was a/w SOB and orthopnea, and similar to his prior MI's. The pain improved with SL NTG. He called me on the emergency line, and I advised him to call EMS, who brought him to the ED for further evaluation.  In the ED, his EKG was benign, vital stable, and initial troponin negative.  He was started on a nitro infusion which improved his pain, and cardiology was asked to evaluate for admission.  He has been compliant with his medications at home and has completely stopped smoking.  Pt was admitted to [facility] in 11/2016, at which time LHC revealed severe multivessel CAD which was not amenable to PCI.  Thus, max medical therapy was pursued.  Pt had been otherwise doing well up until this evening.     Review of Systems:     Cardiac Review of Systems: {Y] = yes [ ]  = no  Chest Pain [  Y  ]  Resting SOB [ Y  ] Exertional SOB  [ Y ]  Orthopnea [Y ]   Pedal Edema [   ]    Palpitations [  ] Syncope  [  ]   Presyncope [   ]  General Review of Systems: [Y] = yes [  ]=no Constitional: recent weight change [  ]; anorexia [ Y ]; fatigue [  Y]; nausea [  ]; night sweats [  ]; fever [  ]; or chills [  ];                                                                     Dental: poor dentition[  ];   Eye : blurred vision [  ]; diplopia [   ]; vision changes [  ];  Amaurosis fugax[  ]; Resp: cough [  ];  wheezing[  ];  hemoptysis[  ]; shortness of breath[  ]; paroxysmal nocturnal dyspnea[  ]; dyspnea on exertion[  ]; or orthopnea[  ];  GI:  gallstones[  ], vomiting[  ];   dysphagia[  ]; melena[  ];  hematochezia [  ]; heartburn[  ];   GU: kidney stones [  ]; hematuria[  ];   dysuria [  ];  nocturia[  ];               Skin: rash [  ], swelling[  ];, hair loss[  ];  peripheral edema[  ];  or itching[  ]; Musculosketetal: myalgias[  ];  joint swelling[  ];  joint erythema[  ];  joint pain[  ];  back pain[  ];  Heme/Lymph: bruising[  ];  bleeding[  ];  anemia[  ];  Neuro: TIA[  ];  headaches[  ];  stroke[  ];  vertigo[  ];  seizures[  ];  paresthesias[  ];  difficulty walking[  ];  Psych:depression[  ]; anxiety[  ];  Endocrine: diabetes[  ];  thyroid dysfunction[  ];  Other:  Past Medical History:  Diagnosis Date  . Anxiety   . CAD (coronary artery disease)    a. s/p multiple PCIs with last cath 11/2016 with severe multivessel CAD, s/p PCTA to LCx but unable to pass stent  . Chronic leg pain    bilateral  . Chronic lower back pain   . COPD (chronic obstructive pulmonary disease) ([place])   . Depression   . GERD (gastroesophageal reflux disease)    Takes Dexilant  . HLD (hyperlipidemia)   . Hypertension   . Rhabdomyolysis    h/o, r/t statins  . Sleep apnea    "can't tolerate mask" (12/16/2016)  . Type II diabetes mellitus ([facility])     @[facility] @   Allergies  Allergen Reactions  . Iohexol Anaphylaxis    PT. TO BE PREMEDICATED PRIOR TO IV CONTRAST PER DR Kris Hartmann /MMS//12/15/15Desc: PT BECAME SOB AND CHEST TIGHTNESS AFTER CONTRAST INJECTION.  Audree Bane., Onset Date: [id number]     Social History   Social History  . Marital status: Single    Spouse name: N/A  . Number of children: N/A  . Years of education: N/A   Occupational History  . Not on file.   Social History Main Topics  . Smoking status: Former Smoker    Packs/day: 0.25    Years: 25.00    Types: Cigarettes    Quit date: 05/24/2015  . Smokeless tobacco: Never Used  . Alcohol use No  . Drug use: No  . Sexual activity: Yes   Other Topics Concern  . Not on file   Social  History Narrative  . No narrative on file    Family History  Problem Relation Age of Onset  . Heart attack Father   . Hypertension Mother   . Diabetes Mother   . Heart disease Brother        x 3   . Heart attack Brother        deceased  . Hypertension Sister   . Diabetes Sister   . Anesthesia problems Neg Hx   . Hypotension Neg Hx   . Malignant hyperthermia Neg Hx   . Pseudochol deficiency Neg Hx     PHYSICAL EXAM: Vitals:   05/10/17 0154 05/10/17 0157  BP: (!) 79/52 (!) 82/50  Pulse:    Resp: 17 17  Temp:     General:  Mild distress due to pain, np respiratory difficulty HEENT: normal Neck: supple. +JVD. Carotids 2+ bilat; no bruits. No lymphadenopathy or thryomegaly appreciated. Cor: PMI nondisplaced. Regular rate & rhythm. No rubs, gallops or murmurs. Lungs: Bibasilar crackles, R>L Abdomen: soft, nontender, nondistended. No hepatosplenomegaly. No bruits or masses. Good bowel sounds. Extremities: no cyanosis, clubbing, rash, edema Neuro: alert & oriented x 3, cranial nerves grossly intact. moves all 4 extremities w/o difficulty. Affect pleasant.  ECG:  Results for orders placed or performed during the hospital encounter of 05/09/17 (from the past 24 hour(s))  Basic metabolic panel     Status: Abnormal   Collection Time: 05/09/17  9:39 PM  Result Value Ref Range   Sodium 139 135 - 145 mmol/L   Potassium 3.5 3.5 - 5.1 mmol/L   Chloride 107 101 - 111 mmol/L   CO2 24 22 - 32 mmol/L   Glucose, Bld 122 (H) 65 - 99 mg/dL   BUN 10 6 -  20 mg/dL   Creatinine, Ser 0.85 0.61 - 1.24 mg/dL   Calcium 8.7 (L) 8.9 - 10.3 mg/dL   GFR calc non Af Amer >60 >60 mL/min   GFR calc Af Amer >60 >60 mL/min   Anion gap 8 5 - 15  CBC     Status: None   Collection Time: 05/09/17  9:39 PM  Result Value Ref Range   WBC 8.6 4.0 - 10.5 K/uL   RBC 4.92 4.22 - 5.81 MIL/uL   Hemoglobin 13.1 13.0 - 17.0 g/dL   HCT 39.9 39.0 - 52.0 %   MCV 81.1 78.0 - 100.0 fL   MCH 26.6 26.0 - 34.0 pg    MCHC 32.8 30.0 - 36.0 g/dL   RDW 13.7 11.5 - 15.5 %   Platelets 227 150 - 400 K/uL  Brain natriuretic peptide     Status: None   Collection Time: 05/09/17  9:39 PM  Result Value Ref Range   B Natriuretic Peptide 36.7 0.0 - 100.0 pg/mL  I-stat troponin, ED     Status: None   Collection Time: 05/09/17  9:49 PM  Result Value Ref Range   Troponin i, poc 0.00 0.00 - 0.08 ng/mL   Comment 3          Troponin I     Status: None   Collection Time: 05/10/17 12:25 AM  Result Value Ref Range   Troponin I <0.03 <0.03 ng/mL  CBG monitoring, ED     Status: Abnormal   Collection Time: 05/10/17  1:52 AM  Result Value Ref Range   Glucose-Capillary 122 (H) 65 - 99 mg/dL   Dg Chest Portable 1 View  Result Date: 05/09/2017 CLINICAL DATA:  Initial evaluation for acute shortness of breath. EXAM: PORTABLE CHEST 1 VIEW COMPARISON:  Prior radiograph from 12/13/2016. FINDINGS: Mild cardiomegaly, stable. Mediastinal silhouette within normal limits. Lungs normally inflated. Diffuse pulmonary vascular congestion with interstitial prominence, suggesting mild pulmonary interstitial edema. No focal infiltrates. No pleural effusion. No pneumothorax. No acute osseus abnormality. IMPRESSION: Cardiomegaly with mild diffuse vascular congestion and pulmonary interstitial edema. Electronically Signed   By: [name]   On: 05/09/2017 21:56   EKG: NSR, nonspecific ST abnormality, unchanged from prior study  ASSESSMENT: 51 yo male with a pmhx of severe multivessel CAD not amenable to PCI, on max medical therapy, HTN, tobacco abuse, HLD, obesity, DM2, and COPD who presents for evaluation of chest pain and SOB.  The symptoms are similar to his prior MI's and likely represents unstable angina.  He also has evidence of acute heart failure exacerbation with JVD and crackles on exam, as well as pulmonary edema on chest XR.  His pain has improved with IV NTG, though this has had to be turned down due to hypotension.     PLAN/DISCUSSION: 1. Unstable angina with known multivessel CAD not amenable to PCI on last LHC in 11/2016 -Admit to the stepdown with telemetry monitoring -Trend troponin -Continue IV NTG infusion.  Hold PTA imdur -Morphine IV prin for chest pain -Start on Heparin gtt -Continue home ASA and Plavix, with which he has been compliant -Continue lipitor and Nebivolol  -NPO at midnight  -This is a difficult and unfortunate situation.  It was previously mentioned that while he does not have great targets for CABG, may need to consider this if his pain cannot be controlled with medications.   2. Acute heart failure exacerbation  -Given 40mg  IV lasix in ED -Monitor UOP and exam.  May  need to redose in AM -TTE in AM.  LV systolic dysfunction noted on last LV gram   3. HTN -Continue home BB -Hold Diovan for now given soft Bps with IV NTG  4. DM2 -Hold home metformin, cover with ISS and accuchecks  5. COPD -Continue home inhalers  Regino Bellow, DO 2:36 AM

## 2017-05-10 NOTE — Progress Notes (Signed)
ANTICOAGULATION CONSULT NOTE - Initial Consult  Pharmacy Consult for heparin Indication: chest pain/ACS  Allergies  Allergen Reactions  . Iohexol Anaphylaxis    PT. TO BE PREMEDICATED PRIOR TO IV CONTRAST PER DR Kris Hartmann /MMS//12/15/15Desc: PT BECAME SOB AND CHEST TIGHTNESS AFTER CONTRAST INJECTION.  [name],RT-RCT., Onset Date: [id number]     Patient Measurements: Height: 5\' 8"  (172.7 cm) Weight: 261 lb (118.4 kg) IBW/kg (Calculated) : 68.4 Heparin Dosing Weight: 95kg  Vital Signs: Temp: 97.7 F (36.5 C) (06/18 2139) BP: 118/89 (06/18 2345) Pulse Rate: 78 (06/18 2330)  Labs:  Recent Labs  05/09/17 2139  HGB 13.1  HCT 39.9  PLT 227  CREATININE 0.85    Estimated Creatinine Clearance: 130 mL/min (by C-G formula based on SCr of 0.85 mg/dL).   Medical History: Past Medical History:  Diagnosis Date  . Anxiety   . CAD (coronary artery disease)    a. s/p multiple PCIs with last cath 11/2016 with severe multivessel CAD, s/p PCTA to LCx but unable to pass stent  . Chronic leg pain    bilateral  . Chronic lower back pain   . COPD (chronic obstructive pulmonary disease) ([place])   . Depression   . GERD (gastroesophageal reflux disease)    Takes Dexilant  . HLD (hyperlipidemia)   . Hypertension   . Rhabdomyolysis    h/o, r/t statins  . Sleep apnea    "can't tolerate mask" (12/16/2016)  . Type II diabetes mellitus (HCC)      Assessment: 51yo male c/o sudden onset of CP and SOB, significant cardiac PMH, initial istat troponin negative, to begin heparin.  Goal of Therapy:  Heparin level 0.3-0.7 units/ml Monitor platelets by anticoagulation protocol: Yes   Plan:  Will give heparin 4000 units IV bolus x1 followed by gtt at 1300 units/hr and monitor heparin levels and CBC.  Wynona Neat, PharmD, BCPS  05/10/2017,12:13 AM

## 2017-05-10 NOTE — Telephone Encounter (Signed)
Returned the phone call to the patient to inform him that there was not a note of who had called him. He stated he was not sure who it was. He stated that he would call back if he had any questions.

## 2017-05-10 NOTE — Discharge Summary (Signed)
Discharge Summary    Patient ID: BLAYK RILEY,  MRN: [id number], DOB/AGE: June 30, 1966 50 y.o.  Admit date: 05/09/2017 Discharge date: 05/10/2017  Primary Care Provider: Jaclyn Shaggy Primary Cardiologist: Dr. Rennis Golden   Discharge Diagnoses    Active Problems:   Unstable angina (HCC)   Allergies Allergies  Allergen Reactions  . Iohexol Anaphylaxis    PT. TO BE PREMEDICATED PRIOR TO IV CONTRAST PER DR Eppie Gibson /MMS//12/15/15Desc: PT BECAME SOB AND CHEST TIGHTNESS AFTER CONTRAST INJECTION.  [name],RT-RCT., Onset Date: [id number]      History of Present Illness     The patient is a 51 year old male with a PMH of multivessel CAD s/p multiple PCIs, most recent attempt was 11/2016 but unable to pass the stents and did not have good targets for CABG, HTN, OSA, DM2, HLD and obesity.  He present to the ER with complaints of chest pain and SOB 05/09/2017. His symptoms began 05/09/2017 in the evening. Symptoms started with tingling in his left arm that radiated into his neck. This was followed by severe substernal CP with orthopnea and SOB. He felt like his symptoms were consistent with previous MI's. He took a SL NTG and the pain improved. He called on-call [facility] and spoke with the resident who recommended he be evaluated in the ED.    Hospital Course     Consultants: None  In the ED he had a non ischemic EKG, stable vital signs and negative troponins. He was well appearing. He continued to chest pain therefore a nitroglycerin drip was started and his pain improved with this. Per patient he has stopped smoking and is complaint with his medications at home.  LHC attempted January 2018 showing  severe multivessel disease, not amenable to PCI and without targets for CABG. Maximal medical therapy recommended and aggressive secondary prevention. He has been doing well since then and this is the first episode of significant pain he has had. He was admitted for further management. He was  started on 40 mg IV lasix for fluid overload and home BB, he improved from this and required no furher IV doses, home dose restarted. His home Norvasc and Diovan were held this admission and at discharge due to low BPs, likely related to IV NTG. Consider restarting these medications as an outpatient. He did well throughout the hospital stay negative Troponins, low bp but stable, no reoccuring chest pain. No ischemic work-up done. Will continue medical management and aggressive secondary management. Patient ate and walked without any concerns. The patient has had an uncomplicated hospital course and is recovering well. He  has been seen by Dr. Anne Fu today and deemed ready for discharge home.  I attempted to arrange follow-up appointment but for some reason his chart has him listed as "Dismissed From the Practice", discussed with staff at [facility], they plan to work with IT to have this reversed and will call him to arrange appointment. They believe message coming up as an error.   A work excuse note was provided as well. Discharge medications are listed below.  _____________  Discharge Vitals Blood pressure (!) 129/91, pulse 66, temperature 97.7 F (36.5 C), temperature source Oral, resp. rate 18, height 5\' 8"  (1.727 m), weight 261 lb (118.4 kg), SpO2 96 %.  Filed Weights   05/09/17 2137  Weight: 261 lb (118.4 kg)    Labs & Radiologic Studies     CBC  Recent Labs  05/09/17 2139 05/10/17 0720  WBC 8.6 7.9  HGB 13.1 12.2*  HCT 39.9 37.9*  MCV 81.1 82.2  PLT 227 217   Basic Metabolic Panel  Recent Labs  05/09/17 2139 05/10/17 0720  NA 139 139  K 3.5 3.8  CL 107 105  CO2 24 28  GLUCOSE 122* 110*  BUN 10 11  CREATININE 0.85 0.90  CALCIUM 8.7* 8.3*   Liver Function Tests No results for input(s): AST, ALT, ALKPHOS, BILITOT, PROT, ALBUMIN in the last 72 hours. No results for input(s): LIPASE, AMYLASE in the last 72 hours. Cardiac Enzymes  Recent Labs  05/10/17 0025  05/10/17 0720  TROPONINI <0.03 <0.03   BNP Invalid input(s): POCBNP D-Dimer No results for input(s): DDIMER in the last 72 hours. Hemoglobin A1C No results for input(s): HGBA1C in the last 72 hours. Fasting Lipid Panel No results for input(s): CHOL, HDL, LDLCALC, TRIG, CHOLHDL, LDLDIRECT in the last 72 hours. Thyroid Function Tests No results for input(s): TSH, T4TOTAL, T3FREE, THYROIDAB in the last 72 hours.  Invalid input(s): FREET3  Dg Chest Portable 1 View  Result Date: 05/09/2017 CLINICAL DATA:  Initial evaluation for acute shortness of breath. EXAM: PORTABLE CHEST 1 VIEW COMPARISON:  Prior radiograph from 12/13/2016. FINDINGS: Mild cardiomegaly, stable. Mediastinal silhouette within normal limits. Lungs normally inflated. Diffuse pulmonary vascular congestion with interstitial prominence, suggesting mild pulmonary interstitial edema. No focal infiltrates. No pleural effusion. No pneumothorax. No acute osseus abnormality. IMPRESSION: Cardiomegaly with mild diffuse vascular congestion and pulmonary interstitial edema. Electronically Signed   By: Rise Mu M.D.   On: 05/09/2017 21:56     Diagnostic Studies/Procedures     December 16, 2016  Coronary Balloon Angioplasty  Left Heart Cath and Coronary Angiography  Conclusion     ____CULPRIT LESION________  Maxwell Caul to 2nd Mrg lesion, 100 %stenosed. In-stent stenosis  Mid Cx-1 lesion, 100 %stenosed. In an pre-stent stenosis --> PTCA only performed Post intervention, there is a 50% residual stenosis.  Mid Cx-2 lesion, 100 %stenosed. PTCA only performed Post intervention, there is a 50% residual stenosis.  Ost LPDA to LPDA lesion, 100 %stenosed.  Ost 3rd Mrg to 3rd Mrg lesion, 90 %stenosed.  Ost 4th Mrg to 4th Mrg lesion, 80 %stenosed.  __________________________  Mid LAD to Dist LAD lesion, 45 %stenosed.  Ramus lesion, 100 %stenosed. - In-stent stenosis  Prox RCA lesion, 100 %stenosed. - Chronic  The  left ventricular systolic function is normal.  LV end diastolic pressure is normal.  There is no aortic valve stenosis.   Unfortunately the patient had total occlusion of the stented segment in the circumflex that involves the dominant circumflex vessels. The stented OM 2 branch now 100% occluded. Unfortunately, suboptimal minimally effective PTCA was performed. Unable to pass a stent to the desired location. The downstream vessels were extremely diffusely diseased and likely not good targets for bypass, however this could be considered.  Residual diffuse disease was the best that can be done with PTCA. Unable to pass stent. Very rigid lesion wiring high pressure inflation of a noncompliant balloon, but still unable to pass a stent.  PLAN: Overnight observation and prolonged PTCA procedure. We'll need to maximize medical management per Dr. Rennis Golden   Continued dual antiplatelet therapy   Bryan Lemma, M.D., M.S. Interventional Cardiologist     _____________    Disposition   Pt is being discharged home today in good condition.  Follow-up Plans & Appointments       Discharge Medications   Allergies as of 05/10/2017  Reactions   Iohexol Anaphylaxis   PT. TO BE PREMEDICATED PRIOR TO IV CONTRAST PER DR Eppie Gibson /MMS//12/15/15Desc: PT BECAME SOB AND CHEST TIGHTNESS AFTER CONTRAST INJECTION.  [name],RT-RCT., Onset Date: [id number]      Medication List    STOP taking these medications   amLODipine 10 MG tablet Commonly known as:  NORVASC   valsartan-hydrochlorothiazide 320-25 MG tablet Commonly known as:  DIOVAN HCT     TAKE these medications   acetaminophen 500 MG tablet Commonly known as:  TYLENOL Take 1,000 mg by mouth every 6 (six) hours as needed for headache.   acetaminophen-codeine 300-30 MG tablet Commonly known as:  TYLENOL #3 Take 1 tablet by mouth every 12 (twelve) hours as needed for moderate pain.   Albuterol Sulfate 108 (90 Base)  MCG/ACT Aepb Commonly known as:  PROAIR RESPICLICK Inhale 1 puff into the lungs every 6 (six) hours as needed (shortness of breath).   aspirin 81 MG tablet Take 1 tablet (81 mg total) by mouth daily.   atorvastatin 40 MG tablet Commonly known as:  LIPITOR Take 1 tablet (40 mg total) by mouth daily.   benzonatate 200 MG capsule Commonly known as:  TESSALON Take 200 mg by mouth daily.   cetirizine 10 MG tablet Commonly known as:  ZYRTEC Take 1 tablet (10 mg total) by mouth daily.   clopidogrel 75 MG tablet Commonly known as:  PLAVIX Take 1 tablet (75 mg total) by mouth daily.   FLUoxetine 20 MG tablet Commonly known as:  PROZAC Take 1 tablet (20 mg total) by mouth daily.   fluticasone 50 MCG/ACT nasal spray Commonly known as:  FLONASE Place 2 sprays into both nostrils daily.   furosemide 20 MG tablet Commonly known as:  LASIX Take 20 mg by mouth 2 (two) times daily.   hydrocortisone-pramoxine 2.5-1 % rectal cream Commonly known as:  ANALPRAM HC Place 1 application rectally 3 (three) times daily.   isosorbide mononitrate 30 MG 24 hr tablet Commonly known as:  IMDUR Take 90mg  (3 tablets) in the morning and 60mg  (2 tablets) in the PM   magic mouthwash Soln Take 5 mLs by mouth daily.   metFORMIN 1000 MG tablet Commonly known as:  GLUCOPHAGE Take 1 tablet (1,000 mg total) by mouth 2 (two) times daily with a meal.   mometasone 220 MCG/INH inhaler Commonly known as:  ASMANEX Inhale 2 puffs into the lungs daily.   Nebivolol HCl 20 MG Tabs Take 1 tablet (20 mg total) by mouth daily.   nitroGLYCERIN 0.4 MG SL tablet Commonly known as:  NITROSTAT Place 1 tablet (0.4 mg total) under the tongue every 5 (five) minutes as needed for chest pain.   pantoprazole 40 MG tablet Commonly known as:  PROTONIX Take 1 tablet (40 mg total) by mouth daily.   pregabalin 75 MG capsule Commonly known as:  LYRICA Take 1 capsule (75 mg total) by mouth 3 (three) times daily.   RANEXA  1000 MG SR tablet Generic drug:  ranolazine TAKE 1 TABLET (1,000 MG TOTAL) BY MOUTH 2 (TWO) TIMES DAILY.   tiotropium 18 MCG inhalation capsule Commonly known as:  SPIRIVA HANDIHALER Place 1 capsule (18 mcg total) into inhaler and inhale daily.   traZODone 50 MG tablet Commonly known as:  DESYREL Take 1 tablet (50 mg total) by mouth at bedtime as needed for sleep.       Outstanding Labs/Studies   None  Duration of Discharge Encounter   Greater than 30 minutes including physician  time.  Suan Halter PA-C 05/10/2017, 1:09 PM  Personally seen and examined. Agree with above.    51 y.o. male with CAD, chronic chest pain  Assessment & Plan    Chest pain  - reassuring trop neg  - Continue with med mgt  - Prior cath and office notes reviewed  - aggressive secondary prevention  Acute systolic HF  - given a one time dose of lasix. Improved. No orthopnea  - Home dose lasix Ok  - BP soft but stable  HTN  - Home meds  DM2  - continue home meds  COPD  - inh  OK for DC. OK to eat and walk.  Have follow up in 2-4 weeks with Dr. Rennis Golden or APP

## 2017-05-10 NOTE — ED Notes (Signed)
Spoke to [facility] regarding blood pressure. Nitro gtt turned down for hypotensive. Cards to be repaged if bp does not improve

## 2017-05-10 NOTE — Telephone Encounter (Signed)
New Message ° ° pt verbalized that he is returning call for rn  °

## 2017-05-10 NOTE — Progress Notes (Signed)
Andrew West for heparin Indication: chest pain/ACS  Allergies  Allergen Reactions  . Iohexol Anaphylaxis    PT. TO BE PREMEDICATED PRIOR TO IV CONTRAST PER DR Kris Hartmann /MMS//12/15/15Desc: PT BECAME SOB AND CHEST TIGHTNESS AFTER CONTRAST INJECTION.  Andrew West,RT-RCT., Onset Date: [id number]     Patient Measurements: Height: 5\' 8"  (172.7 cm) Weight: 261 lb (118.4 kg) IBW/kg (Calculated) : 68.4 Heparin Dosing Weight: 95kg  Vital Signs: Temp: 97.8 F (36.6 C) (06/19 0729) Temp Source: Oral (06/19 0729) BP: 100/78 (06/19 0729) Pulse Rate: 67 (06/19 0729)  Labs:  Recent Labs  05/09/17 2139 05/10/17 0025 05/10/17 0720  HGB 13.1  --  12.2*  HCT 39.9  --  37.9*  PLT 227  --  217  HEPARINUNFRC  --   --  0.42  CREATININE 0.85  --  0.90  TROPONINI  --  <0.03 <0.03    Estimated Creatinine Clearance: 122.8 mL/min (by C-G formula based on SCr of 0.9 mg/dL).   Medical History: Past Medical History:  Diagnosis Date  . Anxiety   . CAD (coronary artery disease)    a. s/p multiple PCIs with last cath 11/2016 with severe multivessel CAD, s/p PCTA to LCx but unable to pass stent  . Chronic leg pain    bilateral  . Chronic lower back pain   . COPD (chronic obstructive pulmonary disease) ([place])   . Depression   . GERD (gastroesophageal reflux disease)    Takes Dexilant  . HLD (hyperlipidemia)   . Hypertension   . Rhabdomyolysis    h/o, r/t statins  . Sleep apnea    "can't tolerate mask" (12/16/2016)  . Type II diabetes mellitus (HCC)      Assessment: 51yo male c/o sudden onset of CP and SOB, significant cardiac PMH, continuing on heparin for ACS. Troponin negative. Not on anticoagulation PTA.  Initial heparin level therapeutic at 0.42 this AM. CBC stable. No bleed documented.  Goal of Therapy:  Heparin level 0.3-0.7 units/ml Monitor platelets by anticoagulation protocol: Yes   Plan:  Continue heparin at 1300 units/hr 6h heparin  level to confirm Daily heparin level/CBC Monitor for s/sx bleeding   Elicia Lamp, PharmD, BCPS Clinical Pharmacist Rx Phone # for today: [phone] After 3:30PM, please call Main Rx: #[id number] 05/10/2017 9:01 AM

## 2017-05-10 NOTE — Progress Notes (Signed)
05/10/17  1430  Reviewed discharge instructions with patient. Patient verbalized understanding of discharge instructions. Copy of discharge instructions given to patient.

## 2017-05-10 NOTE — ED Notes (Signed)
Attempted report 

## 2017-05-10 NOTE — ED Notes (Signed)
Pt noted to be hypotensive. C/o dizziness and nausea. CP 3/10

## 2017-05-10 NOTE — Progress Notes (Addendum)
Progress Note  Patient Name: Andrew West Date of Encounter: 05/10/2017  Primary Cardiologist: Hilty  Subjective   Head ache from NTG. No CP. Sleepy  Inpatient Medications    Scheduled Meds: . aspirin  325 mg Oral Daily  . atorvastatin  40 mg Oral q1800  . benzonatate  200 mg Oral Daily  . budesonide  0.25 mg Inhalation BID  . clopidogrel  75 mg Oral Daily  . FLUoxetine  20 mg Oral Daily  . fluticasone  2 spray Each Nare Daily  . furosemide  20 mg Oral BID  . insulin aspart  0-15 Units Subcutaneous TID WC  . insulin aspart  0-5 Units Subcutaneous QHS  . loratadine  10 mg Oral Daily  . nebivolol  20 mg Oral Daily  . pantoprazole  40 mg Oral Daily  . pregabalin  75 mg Oral TID  . ranolazine  1,000 mg Oral BID  . tiotropium  18 mcg Inhalation Daily   Continuous Infusions:  PRN Meds: acetaminophen, albuterol, morphine injection, ondansetron (ZOFRAN) IV, traZODone   Vital Signs    Vitals:   05/10/17 0800 05/10/17 0900 05/10/17 0903 05/10/17 1000  BP: 104/76 112/85  115/79  Pulse: 65 64  61  Resp: 15 16  16   Temp:      TempSrc:      SpO2: 98% 100% 98% 98%  Weight:      Height:        Intake/Output Summary (Last 24 hours) at 05/10/17 1033 Last data filed at 05/10/17 0300  Gross per 24 hour  Intake           124.48 ml  Output              575 ml  Net          -450.52 ml   Filed Weights   05/09/17 2137  Weight: 261 lb (118.4 kg)    Telemetry    No adverse rhythm - Personally Reviewed  ECG    NSR with no changes from prior - Personally Reviewed  Physical Exam   GEN: No acute distress.   Neck: No JVD Cardiac: RRR, no murmurs, rubs, or gallops.  Respiratory: Clear to auscultation bilaterally. GI: Soft, nontender, non-distended  MS: No edema; No deformity. Neuro:  Nonfocal  Psych: Normal affect   Labs    Chemistry Recent Labs Lab 05/09/17 2139 05/10/17 0720  NA 139 139  K 3.5 3.8  CL 107 105  CO2 24 28  GLUCOSE 122* 110*  BUN 10  11  CREATININE 0.85 0.90  CALCIUM 8.7* 8.3*  GFRNONAA >60 >60  GFRAA >60 >60  ANIONGAP 8 6     Hematology Recent Labs Lab 05/09/17 2139 05/10/17 0720  WBC 8.6 7.9  RBC 4.92 4.61  HGB 13.1 12.2*  HCT 39.9 37.9*  MCV 81.1 82.2  MCH 26.6 26.5  MCHC 32.8 32.2  RDW 13.7 13.8  PLT 227 217    Cardiac Enzymes Recent Labs Lab 05/10/17 0025 05/10/17 0720  TROPONINI <0.03 <0.03    Recent Labs Lab 05/09/17 2149  TROPIPOC 0.00     BNP Recent Labs Lab 05/09/17 2139  BNP 36.7     DDimer No results for input(s): DDIMER in the last 168 hours.   Radiology    Dg Chest Portable 1 View  Result Date: 05/09/2017 CLINICAL DATA:  Initial evaluation for acute shortness of breath. EXAM: PORTABLE CHEST 1 VIEW COMPARISON:  Prior radiograph from 12/13/2016. FINDINGS: Mild cardiomegaly,  stable. Mediastinal silhouette within normal limits. Lungs normally inflated. Diffuse pulmonary vascular congestion with interstitial prominence, suggesting mild pulmonary interstitial edema. No focal infiltrates. No pleural effusion. No pneumothorax. No acute osseus abnormality. IMPRESSION: Cardiomegaly with mild diffuse vascular congestion and pulmonary interstitial edema. Electronically Signed   By: [name]   On: 05/09/2017 21:56    Cardiac Studies   Prior Cath - 11/2016 - unable to pass stent - med mgt  Patient Profile     51 y.o. male with CAD, chronic chest pain  Assessment & Plan    Chest pain  - reassuring trop neg  - Continue with med mgt  - Prior cath and office notes reviewed  - aggressive secondary prevention  Acute systolic HF  - given a one time dose of lasix. Improved. No orthopnea  - Home dose lasix Ok  - BP soft but stable  HTN  - Home meds  DM2  - continue home meds  COPD  - inh  OK for DC. OK to eat and walk.  Have follow up in 2-4 weeks with Dr. Debara Pickett or APP  Signed, [name], MD  05/10/2017, 10:33 AM

## 2017-05-24 ENCOUNTER — Ambulatory Visit (INDEPENDENT_AMBULATORY_CARE_PROVIDER_SITE_OTHER): Payer: BC Managed Care – PPO | Admitting: Internal Medicine

## 2017-05-24 ENCOUNTER — Encounter: Payer: Self-pay | Admitting: Internal Medicine

## 2017-05-24 VITALS — BP 120/90 | HR 80 | Ht 68.0 in | Wt 256.0 lb

## 2017-05-24 DIAGNOSIS — I208 Other forms of angina pectoris: Secondary | ICD-10-CM | POA: Diagnosis not present

## 2017-05-24 DIAGNOSIS — Z9861 Coronary angioplasty status: Secondary | ICD-10-CM

## 2017-05-24 DIAGNOSIS — Z955 Presence of coronary angioplasty implant and graft: Secondary | ICD-10-CM

## 2017-05-24 DIAGNOSIS — I2510 Atherosclerotic heart disease of native coronary artery without angina pectoris: Secondary | ICD-10-CM

## 2017-05-24 DIAGNOSIS — I202 Refractory angina pectoris: Secondary | ICD-10-CM | POA: Insufficient documentation

## 2017-05-24 MED ORDER — CLOPIDOGREL BISULFATE 75 MG PO TABS: 75.0000 mg | ORAL_TABLET | Freq: Every day | ORAL | 3 refills | Status: DC

## 2017-05-24 MED ORDER — RANOLAZINE ER 1000 MG PO TB12: 1000.0000 mg | ORAL_TABLET | Freq: Two times a day (BID) | ORAL | 3 refills | Status: AC

## 2017-05-24 MED FILL — SPIRIVA 18 MCG CP-HANDIHALE: 18 | 30 days supply | Qty: 30 | Fill #1

## 2017-05-24 MED FILL — HYDROCORT-PRAMOXINE 2.5-1%: 2.5-1 | 10 days supply | Qty: 30 | Fill #1

## 2017-05-24 MED FILL — AMLODIPINE BESYLATE 10 MG T: 10 | 30 days supply | Qty: 30 | Fill #4

## 2017-05-24 MED FILL — FLUoxetine HCL 20 MG CAPS: 20 | 30 days supply | Qty: 30 | Fill #4

## 2017-05-24 MED FILL — metFORMIN HCL 1000 MG TABS: 1000 | 30 days supply | Qty: 60 | Fill #1

## 2017-05-24 MED FILL — RANEXA ER 1,000 MG TABLET: 1000 | 30 days supply | Qty: 60 | Fill #0

## 2017-05-24 MED FILL — VALSARTAN-HCTZ 320-25 MG TA: 320-25 | 30 days supply | Qty: 30 | Fill #1

## 2017-05-24 MED FILL — PANTOPRAZOLE SOD DR 40 MG T: 40 | 30 days supply | Qty: 30 | Fill #2

## 2017-05-24 MED FILL — traZODone HCL 50 MG TABS: 50 | 30 days supply | Qty: 30 | Fill #3

## 2017-05-24 MED FILL — CLOPIDOGREL 75 MG TABLET: 75 | 30 days supply | Qty: 30 | Fill #0

## 2017-05-24 NOTE — Patient Instructions (Addendum)
Your physician recommends that you schedule a follow-up appointment in: ONE MONTH with Dr. Theresa Duty have been referred to Cardiothoracic Surgery  Bystolic 20mg  samples - 6 boxes - provided to patient

## 2017-05-24 NOTE — Progress Notes (Signed)
OFFICE NOTE  Chief Complaint:  Persistent chest pain, hospital follow-up  Primary Care Physician: Arnoldo Morale, MD  HPI:  Andrew West  is a 51 year old gentleman with a history of coronary artery disease and stent placement to the circumflex and obtuse marginal and a history of in stent restenosis to the LAD. There is also a nondominant right coronary artery that is 100% occluded, filled with collaterals. He did have a stress test recently, which showed an EF of 53% and reversible septal ischemia in the setting of chest pain and after much convincing underwent cardiac catheterization.  He then underwent cutting balloon angioplasty to an ostial lateral OM1 branch and the bifurcation AV groove circumflex OM junction. This was successful at reducing the stenosis to 0%. This was in November 2012 and he did not return for followup appointment until April 2013. He was suffering from low back pain and has been evaluated and treated by Dr. Trenton Gammon with a laminectomy/discectomy, which he says was not helpful. Otherwise, he continues to smoke and occasionally complains of shortness of breath and some chest pain symptoms which are atypical. He is on long-acting and/or has not needed to take short-acting nitroglycerin. His other concern today is that he is having problems with his teeth and was recommended to have edentulation and by an oral surgeon Dr. Diona Browner. Finally, he is suffering from significant stress and depression due to recent separation with wife in dealing with his kids. This seems to be a big tablets on his continued smoking.  Mr. Fraley returns today in the office. He feels fairly well. He denies any chest pain. He continues to have problems with low back pain and numbness and tingling in his legs. He is apparently status post laminectomy and microdiscectomy by Dr. Trenton Gammon and continues to have symptoms which may be related to that. He's also complaining of what sounds like  neuropathic pain. He is not currently on medication. He is asking for Tylenol 3 for pain today which I told him I did not prescribe. Ultimately there are no further surgical or nonsurgical options, he may need to go on a neuropathic pain medication or perhaps be referred to a pain management specialist. He is also looking for a new primary care provider as his primary care provider is close to retirement. He reports he has cut back his smoking to about 1 pack every 2 weeks. He is also been out of amlodipine and simvastatin although his blood pressure is well controlled.  I saw Mr. Holan in the office today. He is reporting now complains of shortness of breath and chest pain. He also feels like his breathing is worse when working around chemicals at work. He uses a respiratory protection mask but also feels like he is under significant stress. He's had missed several days of work and is concerned about his job. Sounds like he is in a difficult work environment. He says that he is apologizing for "deceiving me" that he was not honest about his symptoms. He apparently has been having shortness of breath and chest pain for several months and did not relay that to me.  Mr. Ashmead returns to the office today for follow-up. His main complaint is shortness of breath. He's not feeling as much chest pain he's had been previously. We recently did a nuclear stress test which was negative for ischemia and showed an EF of 56%. With regards to shortness of breath he's concerned most of this was related to chemicals at  work although he has not worked in several months. He's had about 20 pound weight gain since we last saw him in the office and denies any lower extremity swelling, orthopnea or PND. Shortness of breath is persistent and is associated with some wheezing. He did see Dr. 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 Shortness of breath seems to be a little bit worse with exertion which makes me wonder whether there is an element of exercise-induced pulmonary hypertension. He's not had an echocardiogram in some time.  [name] returns today for follow-up. He was noted to have some diastolic dysfunction on his echocardiogram. I started him on low-dose Lasix and check lab work including a BNP which is very low around 50. On follow-up today he reports he feels no different with the addition of Lasix. I therefore asked him to take it as needed. He is also describing worsening substernal chest discomfort and a squeezing pressure in his chest today. He says this is been getting worse over the past several days, more than his typical chest discomfort. As previously noted he recently underwent a nuclear stress test a few months ago which was negative for ischemia.   Mr. States returns for follow-up. As mentioned he had had recent progressive chest pain symptoms and worsening shortness of breath. He underwent another cardiac catheterization which demonstrated the following:   Ost 2nd Mrg to 2nd Mrg lesion, 99% stenosed. Post intervention, 99% residual stenosis remained. The lesion was previously treated with a bare metal stentgreater than two years ago.  Mid Cx-2 lesion, 90% stenosed. Post intervention, there is a 20% residual stenosis.  Mid Cx-1 lesion, 80% stenosed. Post intervention, there is a 50% residual stenosis. The lesion was previously treated with a bare metal stent.  Ramus lesion, 100% stenosed. The lesion was previously treated with a bare metal stentgreater than two years ago.  Prox RCA lesion, 100% stenosed.  There is mild left ventricular systolic dysfunction.  Moderately elevated LVEDP of 20 mmHg   Difficult situation with what amounts to be a totally occluded (In-stent re-stenosis) of the OM2 branch with sub-optimal PTCA of the mid AV-Groove In-stent  restenosis. He continues to have chest pain although reports it somewhat improved. He was started on ranolazine and currently is on 1000 mg twice a day.  Mr. Conroy returns today for follow-up. He reports he is doing fairly well on medical therapy. He still gets some sharp chest pain mostly along the right sternal border. He does get short of breath with moderate exertion. He's trying to do some walking on a treadmill but generally stops the exercise once he gets short of breath because he is very nervous. He is not currently working due to combined cardiac pulmonary and musculoskeletal diseases. He says that he and his wife are going to go on a delayed honeymoon since they never took one.  04/20/2016  Mr. Nienhaus returns today for follow-up. He was recently seen in the hospital in the beginning of May for chest pain. He reports that it's up in the left neck base around the area of the left clavicle. It's very exquisitely tender to touch and feels sharp and electric. This pain is related we think to cervical pain. He ruled out for MI and was not worked up further for coronary ischemia. This pain is felt to be distinctly different. His  isosorbide was increased up to 60 mg twice a day but he notes no change in his symptoms with that. His primary care providers hesitant to provide any pain medication.  06/28/2016  Mr. Wilner was seen back today in follow-up. He recently was in the ER at [facility] the left without being seen after 3 hours. An EKG was performed and showed no ischemic changes. He reported going home and taking some nitroglycerin and aspirin with some eventual improvement in his symptoms. He continues to have a sharp left chest discomfort which is tender to the touch and is persistent. It's not necessarily worse with exertion or relieved by rest. I felt this may likely be a neuropathic pain and I recommended starting gabapentin 300 mg daily at bedtime. He reports no improvement with this medication. I  advised him to discontinue today. He's also concerned about poor sleep at night, significant preoccupation with death and anxiety. This is concerning for possible PTSD type symptoms.  08/23/2016  Mr. Montelongo returns today for follow-up. I referred him to behavioral health and he says that he went but it was not helpful. The medical record however does not show any evidence that he made that appointment. He did present to the emergency department on 06/29/2016 with depression and anxiety and was felt not to need inpatient treatment. He underwent nuclear stress testing which was negative for ischemia and showed normal LV function. I do not believe is ongoing chest pain at this time is due to ischemia. Blood pressure is elevated today however 142/102. A recheck was 138/89.  11/03/2016  I saw Mr. Meek today for follow-up again. He is complaining of persistent chest pain which is chronic, tends to be present for most of the day at rest and with exertion. He also complains of shortness of breath. Surprisingly blood pressure is elevated today, much higher than it has been in the past at 150/105. He reports compliance with his medications. This could be contributing to his problems. He is interested in another heart catheterization. I did remind him that we performed a stress test only 3 months ago which was negative for ischemia. His last coronary intervention was about a year ago which she had some percutaneous angioplasty but no new stent placement. He has complex anatomy that was not amenable to PCI, rather medical therapy was recommended however he has been on maximal doses of nitrates, Ranexa, beta blocker and other blood pressure medications. Interestingly, despite a high dose of metoprolol, his heart rate remains up in the 80s. EKG today is nonischemic.  12/02/2016  Mr. Medlin returns today for follow-up. He reports despite changing his medications which has resulted in improvement in blood pressure  that he still has significant chest discomfort and shortness of breath. He is certain that there is a new blockage in his heart that the cause of this. He believes he needs another heart catheterization. Symptoms seem similar to his symptoms prior to his last cutting balloon angioplasty more than 2 years ago.  01/17/2017  Mr. Pavao seen today in follow-up. He underwent recent repeat coronary catheterization which showed no clear targets for intervention. He did have some small distal disease which was angioplastied and noted a small improvement in his symptoms. His nitroglycerin was increased which I think is also contributing to his improvement. At this point I think we need to continue with medical therapy. We could also possibly consider adding some pain medication, perhaps a neuropathic pain medicine is her may be a complex regional  pain syndrome or other component of recurrent chest pain beyond ischemia.  03/24/2017  Mr. Vangilder seen today in follow-up. He continues to have pain in the chest. I don't believe it's anginal. I suspect he might have a complex regional pain syndrome. He is taken gabapentin in the past without much success but does not take it regularly. He may benefit from stronger neuropathic pain medicine such as pregabalin. I discussed with the pharmacist today and we ran an interaction check with his current medications. Potential interaction his Prozac with a slight increased risk of serotonin syndrome. I advised him to monitor that and look for symptoms such as fever or muscle rigidity.  05/24/2017  Mr. Stallsmith returns today for hospital follow-up. He was again in the hospital for chest pain. Cardiac enzymes were negative. He was seen by one of my partners and recommended follow-up with me. Mr. Bothwell reports recurrent chest pain which seems to be nitrate responsive, despite being on high-dose indoor 90 mg every morning and 60 mg every afternoon, pregabalin, Ranexa thousand  milligrams twice a day, high-dose amlodipine, beta blocker, and other treatments for angina, with very well controlled LDL-C less than 60. He is currently on maximal medical therapy. In January he had PCI to the circumflex for in-stent restenosis with a suboptimal result. I reviewed his coronary anatomy today and cath films personally with Dr. Peter Martinique, to see if he was a candidate for chronic total occlusion PCI. His feeling is that there are no real percutaneous options for him. He had at least a diffuse area 50% stenosis of the circumflex with a small distal vessel, close to 50% mid to distal LAD disease and an ostial/proximally occluded RCA with very faint left to right collaterals. Based on these findings, surgical opinion was suggested - although targets are arguably small.  PMHx:  Past Medical History:  Diagnosis Date  . Anxiety   . CAD (coronary artery disease)    a. s/p multiple PCIs with last cath 11/2016 with severe multivessel CAD, s/p PCTA to LCx but unable to pass stent  . Chronic leg pain    bilateral  . Chronic lower back pain   . COPD (chronic obstructive pulmonary disease) ([place])   . Depression   . GERD (gastroesophageal reflux disease)    Takes Dexilant  . HLD (hyperlipidemia)   . Hypertension   . Rhabdomyolysis    h/o, r/t statins  . Sleep apnea    "can't tolerate mask" (12/16/2016)  . Type II diabetes mellitus ([place])     Past Surgical History:  Procedure Laterality Date  . BACK SURGERY    . CARDIAC CATHETERIZATION N/A 09/25/2015   Procedure: Left Heart Cath and Coronary Angiography;  Surgeon: Leonie Man, MD;  Location: [facility];  Service: Cardiovascular;  Laterality: N/A;  . CARDIAC CATHETERIZATION N/A 12/16/2016   Procedure: Left Heart Cath and Coronary Angiography;  Surgeon: Leonie Man, MD;  Location: [facility];  Service: Cardiovascular;  Laterality: N/A;  . CARDIAC CATHETERIZATION N/A 12/16/2016   Procedure: Coronary Balloon  Angioplasty;  Surgeon: Leonie Man, MD;  Location: [facility];  Service: Cardiovascular;  Laterality: N/A;  . COLONOSCOPY W/ POLYPECTOMY    . CORONARY ANGIOPLASTY  09/25/2015   mid cir & om  . CORONARY ANGIOPLASTY WITH STENT PLACEMENT  10/09/2001   PTCA & stenting of mid AV circumflex; 2.5x80mm Pixel stent  . CORONARY ANGIOPLASTY WITH STENT PLACEMENT  12/13/2001   PCI with stent to  mid L circumflex, 95% stenosis to 0% residual  . CORONARY ANGIOPLASTY WITH STENT PLACEMENT  10/10/2003   PCI to mid AV circumflex; LAD 30% disease; RCA 100% occluded prox.  . CORONARY ANGIOPLASTY WITH STENT PLACEMENT  09/01/2011   PCI with stenting with bare metal stent to mid AV groove circumflex and PDA  . CORONARY ANGIOPLASTY WITH STENT PLACEMENT  10/17/2011   cutting balloon angioplasty of ostial lateral OM1 branch and bifurcation AV groove circumflex OM junction; stenosis reduced to 0%  . EXCISIONAL HEMORRHOIDECTOMY    . LEFT HEART CATHETERIZATION WITH CORONARY ANGIOGRAM N/A 10/18/2011   Procedure: LEFT HEART CATHETERIZATION WITH CORONARY ANGIOGRAM;  Surgeon: Leonie Man, MD;  Location: [facility];  Service: Cardiovascular;  Laterality: N/A;  . LUMBAR LAMINECTOMY/DECOMPRESSION MICRODISCECTOMY  03/31/2012   Procedure: LUMBAR LAMINECTOMY/DECOMPRESSION MICRODISCECTOMY 1 LEVEL;  Surgeon: Charlie Pitter, MD;  Location: [facility];  Service: Neurosurgery;  Laterality: Left;  . TRANSTHORACIC ECHOCARDIOGRAM  07/28/2011   EF 55-65%; LVH, grade 1 diastolic dysfunction;     FAMHx:  Family History  Problem Relation Age of Onset  . Heart attack Father   . Hypertension Mother   . Diabetes Mother   . Heart disease Brother        x 3   . Heart attack Brother        deceased  . Hypertension Sister   . Diabetes Sister   . Anesthesia problems Neg Hx   . Hypotension Neg Hx   . Malignant hyperthermia Neg Hx   . Pseudochol deficiency Neg Hx     SOCHx:   reports that he quit smoking about 2 years ago.  His smoking use included Cigarettes. He has a 6.25 pack-year smoking history. He has never used smokeless tobacco. He reports that he does not drink alcohol or use drugs.  ALLERGIES:  Allergies  Allergen Reactions  . Iohexol Anaphylaxis    PT. TO BE PREMEDICATED PRIOR TO IV CONTRAST PER DR Kris Hartmann /MMS//12/15/15Desc: PT BECAME SOB AND CHEST TIGHTNESS AFTER CONTRAST INJECTION.  [name],RT-RCT., Onset Date: [id number]     ROS: Pertinent items noted in HPI and remainder of comprehensive ROS otherwise negative.  HOME MEDS: Current Outpatient Prescriptions  Medication Sig Dispense Refill  . acetaminophen (TYLENOL) 500 MG tablet Take 1,000 mg by mouth every 6 (six) hours as needed for headache.    Marland Kitchen acetaminophen-codeine (TYLENOL #3) 300-30 MG tablet Take 1 tablet by mouth every 12 (twelve) hours as needed for moderate pain. 60 tablet 1  . Albuterol Sulfate (PROAIR RESPICLICK) 878 (90 Base) MCG/ACT AEPB Inhale 1 puff into the lungs every 6 (six) hours as needed (shortness of breath). 3 each 1  . amLODipine (NORVASC) 10 MG tablet Take 10 mg by mouth daily.  1  . aspirin 81 MG tablet Take 1 tablet (81 mg total) by mouth daily.    Marland Kitchen atorvastatin (LIPITOR) 40 MG tablet Take 1 tablet (40 mg total) by mouth daily. 90 tablet 1  . benzonatate (TESSALON) 200 MG capsule Take 200 mg by mouth daily.    . cetirizine (ZYRTEC) 10 MG tablet Take 1 tablet (10 mg total) by mouth daily. 30 tablet 1  . clopidogrel (PLAVIX) 75 MG tablet Take 1 tablet (75 mg total) by mouth daily. 90 tablet 3  . FLUoxetine (PROZAC) 20 MG tablet Take 1 tablet (20 mg total) by mouth daily. 90 tablet 1  . fluticasone (FLONASE) 50 MCG/ACT nasal spray Place 2 sprays into both nostrils daily. 16 g  6  . furosemide (LASIX) 20 MG tablet Take 20 mg by mouth 2 (two) times daily.    . hydrocortisone-pramoxine (ANALPRAM HC) 2.5-1 % rectal cream Place 1 application rectally 3 (three) times daily. 30 g 1  . isosorbide mononitrate (IMDUR) 30  MG 24 hr tablet Take 90mg  (3 tablets) in the morning and 60mg  (2 tablets) in the PM 90 tablet 3  . magic mouthwash SOLN Take 5 mLs by mouth daily. 50 mL 0  . metFORMIN (GLUCOPHAGE) 1000 MG tablet Take 1 tablet (1,000 mg total) by mouth 2 (two) times daily with a meal. 180 tablet 1  . mometasone (ASMANEX) 220 MCG/INH inhaler Inhale 2 puffs into the lungs daily. 3 Inhaler 1  . Nebivolol HCl 20 MG TABS Take 1 tablet (20 mg total) by mouth daily. 90 tablet 3  . nitroGLYCERIN (NITROSTAT) 0.4 MG SL tablet Place 1 tablet (0.4 mg total) under the tongue every 5 (five) minutes as needed for chest pain. 25 tablet 2  . pantoprazole (PROTONIX) 40 MG tablet Take 1 tablet (40 mg total) by mouth daily. 90 tablet 1  . pregabalin (LYRICA) 75 MG capsule Take 1 capsule (75 mg total) by mouth 3 (three) times daily. 270 capsule 3  . ranolazine (RANEXA) 1000 MG SR tablet Take 1 tablet (1,000 mg total) by mouth 2 (two) times daily. 180 tablet 3  . tiotropium (SPIRIVA HANDIHALER) 18 MCG inhalation capsule Place 1 capsule (18 mcg total) into inhaler and inhale daily. 30 capsule 3  . traZODone (DESYREL) 50 MG tablet Take 1 tablet (50 mg total) by mouth at bedtime as needed for sleep. 90 tablet 1  . valsartan-hydrochlorothiazide (DIOVAN-HCT) 320-25 MG tablet Take 1 tablet by mouth daily.  1   No current facility-administered medications for this visit.     LABS/IMAGING: No results found for this or any previous visit (from the past 48 hour(s)). No results found.  VITALS: BP 120/90   Pulse 80   Ht 5\' 8"  (1.727 m)   Wt 256 lb (116.1 kg)   BMI 38.92 kg/m   EXAM: General appearance: alert and no distress Lungs: clear to auscultation bilaterally Heart: regular rate and rhythm, S1, S2 normal, no murmur, click, rub or gallop Extremities: extremities normal, atraumatic, no cyanosis or edema Neurologic: Grossly normal  EKG: Deferred  ASSESSMENT: 1. Recurrent chest pain and dyspnea at rest and with exertion -  Occluded OM2 at a prior stent - no good       revascularization options - low risk Myoview with no ischemia (07/2016) 2. Coronary artery disease status post PCI and cutting balloon angioplasty (11/2016) 3. Ongoing tobacco abuse 4. Uncontrolled hypertension 5. Dyslipidemia 6. 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 Persistent low back pain 8. Diabetes type 2 9. Morbid obesity 10. Anxiety/?PTSD  PLAN: 1.   Mr. Johansson continues to have chest pain with exertion and at times at rest. He is on maximal medical therapy. I've added medications for possible complex regional pain syndrome. He seems to be suffering from a depression and PTSD at this point but is not seen a psychiatrist. He says he can't live with this ongoing pain much longer. As mentioned above I reviewed percutaneous options with Dr. Martinique and he feels that they're limited. A surgical consultation was suggested. I will refer him to [facility] for their opinion.  Follow-up with me in 1 month.  Pixie Casino, MD, [facility] Attending Cardiologist [place] 05/24/2017, 2:02 PM

## 2017-05-26 ENCOUNTER — Telehealth: Payer: Self-pay | Admitting: Internal Medicine

## 2017-05-26 NOTE — Telephone Encounter (Signed)
I spoke w [name] this AM. He reports sharp pains in his arm, with intermittent numbness.  He was seen by Dr. Debara Pickett on 7/3 and had outlined same concerns at appt. Thinks problem is a little worse since then.  Notes that he laid around in bed yesterday, used hot compresses alternating w ice, which improved his problem, but states pain back today. Asked if any dyspnea, new fatigue, etc - pt notes "a little" short of breath. Voices he doesn't want to bother Dr. Debara Pickett w this but feels concerned about the problem. He is awaiting phone call for scheduling w cardiothoracic surgeon based on referral placed Tuesday.  Informed pt I'd route to Dr. Debara Pickett for any advice.

## 2017-05-26 NOTE — Telephone Encounter (Signed)
Andrew West is calling because he keep having these sharp pains in his left arm and then it goes numb also he is having shortness of breath . It has been happening for a couple of weeks . States that he saw Dr. Debara Pickett this week . Please call

## 2017-05-26 NOTE — Telephone Encounter (Signed)
Pt advised on provider recommendations, voiced understanding and thanks.

## 2017-05-26 NOTE — Telephone Encounter (Signed)
Back pain is not cardiac - arm pain may be musculoskeletal. He could see his PCP about this.  Dr. Lemmie Evens

## 2017-05-27 ENCOUNTER — Ambulatory Visit: Payer: BC Managed Care – PPO | Admitting: Family Medicine

## 2017-05-31 ENCOUNTER — Telehealth: Payer: Self-pay | Admitting: Internal Medicine

## 2017-05-31 NOTE — Telephone Encounter (Signed)
New message    Pt c/o of Chest Pain: STAT if CP now or developed within 24 hours  1. Are you having CP right now? yes  2. Are you experiencing any other symptoms (ex. SOB, nausea, vomiting, sweating)? Sweating, SOB, L arm pain, swollen  3. How long have you been experiencing CP? A while  4. Is your CP continuous or coming and going? continuous  5. Have you taken Nitroglycerin? One 20 minutes ago ?

## 2017-05-31 NOTE — Telephone Encounter (Signed)
Spoke with pt he states that he is having CP and pressure, Sweating, SOB, L arm pain, he states that he is concerned about this. informed pt that these are "classic MI signs and he should go to the ER. informed pt to get a ride to the ER or call 9-1-1. He states that his daughter is on her way home and should be home in literally 2 minutes. He will go to there ER at that time.

## 2017-05-31 NOTE — Telephone Encounter (Signed)
Ok ... Thanks.  Dr. Lemmie Evens

## 2017-06-06 ENCOUNTER — Encounter (HOSPITAL_COMMUNITY): Payer: Self-pay | Admitting: Oncology

## 2017-06-06 ENCOUNTER — Emergency Department (HOSPITAL_COMMUNITY)
Admission: EM | Admit: 2017-06-06 | Discharge: 2017-06-06 | Disposition: A | Discharge status: Home / Self Care | Payer: BC Managed Care – PPO | Attending: Emergency Medicine | Admitting: Emergency Medicine

## 2017-06-06 DIAGNOSIS — E119 Type 2 diabetes mellitus without complications: Secondary | ICD-10-CM | POA: Insufficient documentation

## 2017-06-06 DIAGNOSIS — K029 Dental caries, unspecified: Secondary | ICD-10-CM | POA: Diagnosis not present

## 2017-06-06 DIAGNOSIS — I10 Essential (primary) hypertension: Secondary | ICD-10-CM | POA: Insufficient documentation

## 2017-06-06 DIAGNOSIS — Z87891 Personal history of nicotine dependence: Secondary | ICD-10-CM | POA: Diagnosis not present

## 2017-06-06 DIAGNOSIS — Z955 Presence of coronary angioplasty implant and graft: Secondary | ICD-10-CM | POA: Insufficient documentation

## 2017-06-06 DIAGNOSIS — I2510 Atherosclerotic heart disease of native coronary artery without angina pectoris: Secondary | ICD-10-CM | POA: Diagnosis not present

## 2017-06-06 DIAGNOSIS — Z79899 Other long term (current) drug therapy: Secondary | ICD-10-CM | POA: Diagnosis not present

## 2017-06-06 DIAGNOSIS — Z7984 Long term (current) use of oral hypoglycemic drugs: Secondary | ICD-10-CM | POA: Diagnosis not present

## 2017-06-06 DIAGNOSIS — J449 Chronic obstructive pulmonary disease, unspecified: Secondary | ICD-10-CM | POA: Diagnosis not present

## 2017-06-06 DIAGNOSIS — Z7982 Long term (current) use of aspirin: Secondary | ICD-10-CM | POA: Diagnosis not present

## 2017-06-06 DIAGNOSIS — K047 Periapical abscess without sinus: Secondary | ICD-10-CM | POA: Insufficient documentation

## 2017-06-06 DIAGNOSIS — K0889 Other specified disorders of teeth and supporting structures: Secondary | ICD-10-CM | POA: Diagnosis present

## 2017-06-06 MED ORDER — PENICILLIN V POTASSIUM 500 MG PO TABS: 500.0000 mg | ORAL_TABLET | Freq: Four times a day (QID) | ORAL | 0 refills | Status: DC

## 2017-06-06 MED ORDER — OXYCODONE-ACETAMINOPHEN 5-325 MG PO TABS: 1.0000 | ORAL_TABLET | ORAL | 0 refills | Status: DC | PRN

## 2017-06-06 MED ORDER — PENICILLIN V POTASSIUM 500 MG PO TABS
500.0000 mg | ORAL_TABLET | Freq: Once | ORAL | Status: AC
Start: 2017-06-06 — End: 2017-06-06
  Administered 2017-06-06: 500 mg via ORAL
  Filled 2017-06-06: qty 1

## 2017-06-06 MED ORDER — OXYCODONE-ACETAMINOPHEN 5-325 MG PO TABS
1.0000 | ORAL_TABLET | Freq: Once | ORAL | Status: AC
  Administered 2017-06-06: 1 via ORAL
  Filled 2017-06-06: qty 1

## 2017-06-06 NOTE — ED Provider Notes (Signed)
[facility] DEPT Provider Note   CSN: [id number] Arrival date & time: 06/06/17  0107     History   Chief Complaint Chief Complaint  Patient presents with  . Dental Pain    HPI Andrew West is a 51 y.o. male.  Patient presents with dental pain over multiple areas. He admits this is an ongoing issue, however, intense pain started over the last 24 hours. No fever, facial swelling, nausea or vomiting. He is a diabetic and reports his blood sugar has been well controlled.    The history is provided by the patient. No language interpreter was used.  Dental Pain      Past Medical History:  Diagnosis Date  . Anxiety   . CAD (coronary artery disease)    a. s/p multiple PCIs with last cath 11/2016 with severe multivessel CAD, s/p PCTA to LCx but unable to pass stent  . Chronic leg pain    bilateral  . Chronic lower back pain   . COPD (chronic obstructive pulmonary disease) ([place])   . Depression   . GERD (gastroesophageal reflux disease)    Takes Dexilant  . HLD (hyperlipidemia)   . Hypertension   . Rhabdomyolysis    h/o, r/t statins  . Sleep apnea    "can't tolerate mask" (12/16/2016)  . Type II diabetes mellitus [facility])     Patient Active Problem List   Diagnosis Date Noted  . Refractory angina ([place]) 05/24/2017  . Unstable angina ([place]) 05/10/2017  . Complex regional pain syndrome type I 03/24/2017  . Progressive angina ([place]) -Class III 12/16/2016  . Anxiety 06/28/2016  . Diastolic dysfunction-grade 2 with EF 60-65% Oct 2016 03/22/2016  . Hypertension 10/03/2015  . CAD S/P multiple PCI's 10/03/2015  . Pulmonary nodule 06/24/2015  . Cough 06/24/2015  . COPD with asthma ([place]) 06/24/2015  . Reactive airway disease 06/04/2015  . DOE (dyspnea on exertion) 04/15/2015  . Lumbar disc herniation with radiculopathy 03/31/2012  . Presence of stent in left circumflex coronary artery 10/18/2011    Class: History of  . TOBACCO ABUSE 02/27/2009  . ABDOMINAL PAIN, LEFT LOWER  QUADRANT 12/30/2008  . ABDOMINAL PAIN, EPIGASTRIC 12/05/2008  . Depression 09/05/2008  . Hereditary and idiopathic peripheral neuropathy 09/05/2008  . GERD 09/05/2008  . Cervical disc disorder with radiculopathy of cervical region 08/19/2008  . HLD (hyperlipidemia) 04/25/2008    Class: Diagnosis of  . ALLERGIC RHINITIS 04/25/2008  . Backache 04/25/2008  . Chest pain, unspecified 04/25/2008  . COLONIC POLYPS, HX OF 04/25/2008  . Obesity 04/18/2008    Class: Diagnosis of  . ANAL FISSURE, HX OF 04/18/2008  . Diabetes mellitus ([place]) 01/30/2008    Class: History of  . RECTAL BLEEDING 01/30/2008    Past Surgical History:  Procedure Laterality Date  . BACK SURGERY    . CARDIAC CATHETERIZATION N/A 09/25/2015   Procedure: Left Heart Cath and Coronary Angiography;  Surgeon: Leonie Man, MD;  Location: [facility] CV LAB;  Service: Cardiovascular;  Laterality: N/A;  . CARDIAC CATHETERIZATION N/A 12/16/2016   Procedure: Left Heart Cath and Coronary Angiography;  Surgeon: Leonie Man, MD;  Location: [facility] CV LAB;  Service: Cardiovascular;  Laterality: N/A;  . CARDIAC CATHETERIZATION N/A 12/16/2016   Procedure: Coronary Balloon Angioplasty;  Surgeon: Leonie Man, MD;  Location: [facility] LAB;  Service: Cardiovascular;  Laterality: N/A;  . COLONOSCOPY W/ POLYPECTOMY    . CORONARY ANGIOPLASTY  09/25/2015   mid cir & om  . CORONARY  ANGIOPLASTY WITH STENT PLACEMENT  10/09/2001   PTCA & stenting of mid AV circumflex; 2.5x75mm Pixel stent  . CORONARY ANGIOPLASTY WITH STENT PLACEMENT  12/13/2001   PCI with stent to mid L circumflex, 95% stenosis to 0% residual  . CORONARY ANGIOPLASTY WITH STENT PLACEMENT  10/10/2003   PCI to mid AV circumflex; LAD 30% disease; RCA 100% occluded prox.  . CORONARY ANGIOPLASTY WITH STENT PLACEMENT  09/01/2011   PCI with stenting with bare metal stent to mid AV groove circumflex and PDA  . CORONARY ANGIOPLASTY WITH STENT PLACEMENT  10/17/2011    cutting balloon angioplasty of ostial lateral OM1 branch and bifurcation AV groove circumflex OM junction; stenosis reduced to 0%  . EXCISIONAL HEMORRHOIDECTOMY    . LEFT HEART CATHETERIZATION WITH CORONARY ANGIOGRAM N/A 10/18/2011   Procedure: LEFT HEART CATHETERIZATION WITH CORONARY ANGIOGRAM;  Surgeon: Leonie Man, MD;  Location: [facility] CATH LAB;  Service: Cardiovascular;  Laterality: N/A;  . LUMBAR LAMINECTOMY/DECOMPRESSION MICRODISCECTOMY  03/31/2012   Procedure: LUMBAR LAMINECTOMY/DECOMPRESSION MICRODISCECTOMY 1 LEVEL;  Surgeon: Charlie Pitter, MD;  Location: [facility] NEURO ORS;  Service: Neurosurgery;  Laterality: Left;  . TRANSTHORACIC ECHOCARDIOGRAM  07/28/2011   EF 55-65%; LVH, grade 1 diastolic dysfunction;        Home Medications    Prior to Admission medications   Medication Sig Start Date End Date Taking? Authorizing Provider  acetaminophen (TYLENOL) 500 MG tablet Take 1,000 mg by mouth every 6 (six) hours as needed for headache.    [provider]  acetaminophen-codeine (TYLENOL #3) 300-30 MG tablet Take 1 tablet by mouth every 12 (twelve) hours as needed for moderate pain. 03/25/17   Arnoldo Morale, MD  Albuterol Sulfate (PROAIR RESPICLICK) 607 (90 Base) MCG/ACT AEPB Inhale 1 puff into the lungs every 6 (six) hours as needed (shortness of breath). 04/08/17   Arnoldo Morale, MD  amLODipine (NORVASC) 10 MG tablet Take 10 mg by mouth daily. 04/13/17   [provider]  aspirin 81 MG tablet Take 1 tablet (81 mg total) by mouth daily. 03/23/16   Eileen Stanford, PA-C  atorvastatin (LIPITOR) 40 MG tablet Take 1 tablet (40 mg total) by mouth daily. 04/08/17   Arnoldo Morale, MD  benzonatate (TESSALON) 200 MG capsule Take 200 mg by mouth daily. 02/24/17   [provider]  cetirizine (ZYRTEC) 10 MG tablet Take 1 tablet (10 mg total) by mouth daily. 04/08/17   Arnoldo Morale, MD  clopidogrel (PLAVIX) 75 MG tablet Take 1 tablet (75 mg total) by mouth daily. 05/24/17   Hilty, Nadean Corwin, MD  FLUoxetine (PROZAC) 20 MG tablet Take 1 tablet (20 mg total) by mouth daily. 04/08/17   Arnoldo Morale, MD  fluticasone (FLONASE) 50 MCG/ACT nasal spray Place 2 sprays into both nostrils daily. 02/24/17   Argentina Donovan, PA-C  furosemide (LASIX) 20 MG tablet Take 20 mg by mouth 2 (two) times daily.    [provider]  hydrocortisone-pramoxine (ANALPRAM HC) 2.5-1 % rectal cream Place 1 application rectally 3 (three) times daily. 04/08/17   Arnoldo Morale, MD  isosorbide mononitrate (IMDUR) 30 MG 24 hr tablet Take 90mg  (3 tablets) in the morning and 60mg  (2 tablets) in the PM 12/24/16   Strader, [place] M, PA-C  magic mouthwash SOLN Take 5 mLs by mouth daily. 04/11/17   Emeline General, PA-C  metFORMIN (GLUCOPHAGE) 1000 MG tablet Take 1 tablet (1,000 mg total) by mouth 2 (two) times daily with a meal. 04/08/17   Arnoldo Morale, MD  mometasone (ASMANEX) 220 MCG/INH inhaler Inhale 2 puffs into the lungs daily. 04/08/17   Arnoldo Morale, MD  Nebivolol HCl 20 MG TABS Take 1 tablet (20 mg total) by mouth daily. 11/08/16   Hilty, Nadean Corwin, MD  nitroGLYCERIN (NITROSTAT) 0.4 MG SL tablet Place 1 tablet (0.4 mg total) under the tongue every 5 (five) minutes as needed for chest pain. 12/24/16   Ahmed Prima, Fransisco Hertz, PA-C  pantoprazole (PROTONIX) 40 MG tablet Take 1 tablet (40 mg total) by mouth daily. 04/08/17   Arnoldo Morale, MD  pregabalin (LYRICA) 75 MG capsule Take 1 capsule (75 mg total) by mouth 3 (three) times daily. 03/24/17   Hilty, Nadean Corwin, MD  ranolazine (RANEXA) 1000 MG SR tablet Take 1 tablet (1,000 mg total) by mouth 2 (two) times daily. 05/24/17   Hilty, Nadean Corwin, MD  tiotropium (SPIRIVA HANDIHALER) 18 MCG inhalation capsule Place 1 capsule (18 mcg total) into inhaler and inhale daily. 04/08/17   Arnoldo Morale, MD  traZODone (DESYREL) 50 MG tablet Take 1 tablet (50 mg total) by mouth at bedtime as needed for sleep. 03/15/17   Arnoldo Morale, MD  valsartan-hydrochlorothiazide (DIOVAN-HCT)  320-25 MG tablet Take 1 tablet by mouth daily. 04/08/17   [provider]    Family History Family History  Problem Relation Age of Onset  . Heart attack Father   . Hypertension Mother   . Diabetes Mother   . Heart disease Brother        x 3   . Heart attack Brother        deceased  . Hypertension Sister   . Diabetes Sister   . Anesthesia problems Neg Hx   . Hypotension Neg Hx   . Malignant hyperthermia Neg Hx   . Pseudochol deficiency Neg Hx     Social History Social History  Substance Use Topics  . Smoking status: Former Smoker    Packs/day: 0.25    Years: 25.00    Types: Cigarettes    Quit date: 05/24/2015  . Smokeless tobacco: Never Used  . Alcohol use No     Allergies   Iohexol   Review of Systems Review of Systems  Constitutional: Negative for fever.  HENT: Positive for dental problem. Negative for facial swelling and trouble swallowing.   Gastrointestinal: Negative for nausea and vomiting.  Musculoskeletal: Negative for neck pain and neck stiffness.     Physical Exam Updated Vital Signs BP (!) 145/97 (BP Location: Left Arm)   Pulse 70   Temp 97.7 F (36.5 C) (Oral)   Resp 20   SpO2 99%   Physical Exam  Constitutional: He is oriented to person, place, and time. He appears well-developed and well-nourished.  HENT:  Extensive dental decay. There are abscesses visualized at #5 and #13. Oropharynx is benign.   Neck: Normal range of motion. Neck supple.  Pulmonary/Chest: Effort normal.  Musculoskeletal: Normal range of motion.  Lymphadenopathy:    He has cervical adenopathy.  Neurological: He is alert and oriented to person, place, and time.  Skin: Skin is warm and dry.  Psychiatric: He has a normal mood and affect.     ED Treatments / Results  Labs (all labs ordered are listed, but only abnormal results are displayed) Labs Reviewed - No data to display  EKG  EKG Interpretation None       Radiology No results  found.  Procedures Procedures (including critical care time)  Medications Ordered in ED Medications  oxyCODONE-acetaminophen (PERCOCET/ROXICET) 5-325 MG per  tablet 1 tablet (not administered)  penicillin v potassium (VEETID) tablet 500 mg (not administered)     Initial Impression / Assessment and Plan / ED Course  I have reviewed the triage vital signs and the nursing notes.  Pertinent labs & imaging results that were available during my care of the patient were reviewed by me and considered in my medical decision making (see chart for details).     Patient presents with dental pain. He is found to have multiple abscesses. No difficulty swallowing.   He is waiting on his insurance card for dental care. Will provide resources, PCN, pain management.   Final Clinical Impressions(s) / ED Diagnoses   Final diagnoses:  None   1. Widespread dental decay 2. Multiple dental abscesses   New Prescriptions New Prescriptions   No medications on file     Charlann Lange, Hershal Coria 06/06/17 0232    Orpah Greek, MD 06/06/17 [phone]

## 2017-06-06 NOTE — ED Triage Notes (Signed)
Pt c/o dental pain in entire mouth as well as generalized HA.  Pt rates pain 10/10, throbbing in nature.  Per pt this has been an ongoing issue for several months.  States the pain will get better and then come back.

## 2017-06-07 ENCOUNTER — Encounter: Payer: Self-pay | Admitting: Cardiothoracic Surgery

## 2017-06-07 ENCOUNTER — Institutional Professional Consult (permissible substitution) (INDEPENDENT_AMBULATORY_CARE_PROVIDER_SITE_OTHER): Payer: BC Managed Care – PPO | Admitting: Cardiothoracic Surgery

## 2017-06-07 VITALS — BP 123/84 | HR 80 | Resp 20 | Ht 68.0 in | Wt 253.0 lb

## 2017-06-07 DIAGNOSIS — I2510 Atherosclerotic heart disease of native coronary artery without angina pectoris: Secondary | ICD-10-CM

## 2017-06-07 NOTE — Progress Notes (Signed)
PCP is Arnoldo Morale, MD Referring Provider is Pixie Casino, MD  Chief Complaint  Patient presents with  . Coronary Artery Disease    Surgical eval, Cardiac Cath 12/16/16   Patient examined, most recent cardiac catheterization-coronary angiograms personally reviewed and counseled with patient  HPI: Very nice 51 year old obese diabetic AA male reformed smoker presents with stable class III-class IV angina and severe two-vessel coronary artery disease with normal LV function. The patient has first PCI secured ago at age 26. He now has extensive stents to both the dominant circumflex system as well as the nondominant RCA system. Distal vessels are atretic and not adequate for grafting. His LAD is large, probably intramyocardial and without significant disease.  The patient has difficulty with chest pain as well as left arm pain and lower extremity pain. He was hospitalized last month for chest pain and ruled out for MI. His last cardiac catheterization was January 2018.  I explained to the patient and his wife that his circumflex and RCA distal vessels  would not be graftable due to small size and diffuse pattern of disease and that his LAD, large and with normal flow, should provide some collateralization to those areas. Continued medical therapy is his best long-term option at this time. If he did develop significant LAD disease in the future then revascularization options should be reconsidered.  Past Medical History:  Diagnosis Date  . Anxiety   . CAD (coronary artery disease)    a. s/p multiple PCIs with last cath 11/2016 with severe multivessel CAD, s/p PCTA to LCx but unable to pass stent  . Chronic leg pain    bilateral  . Chronic lower back pain   . COPD (chronic obstructive pulmonary disease) ([place])   . Depression   . GERD (gastroesophageal reflux disease)    Takes Dexilant  . HLD (hyperlipidemia)   . Hypertension   . Rhabdomyolysis    h/o, r/t statins  . Sleep apnea     "can't tolerate mask" (12/16/2016)  . Type II diabetes mellitus ([place])     Past Surgical History:  Procedure Laterality Date  . BACK SURGERY    . CARDIAC CATHETERIZATION N/A 09/25/2015   Procedure: Left Heart Cath and Coronary Angiography;  Surgeon: Leonie Man, MD;  Location: [place] CV LAB;  Service: Cardiovascular;  Laterality: N/A;  . CARDIAC CATHETERIZATION N/A 12/16/2016   Procedure: Left Heart Cath and Coronary Angiography;  Surgeon: Leonie Man, MD;  Location: [facility] CV LAB;  Service: Cardiovascular;  Laterality: N/A;  . CARDIAC CATHETERIZATION N/A 12/16/2016   Procedure: Coronary Balloon Angioplasty;  Surgeon: Leonie Man, MD;  Location: [facility] LAB;  Service: Cardiovascular;  Laterality: N/A;  . COLONOSCOPY W/ POLYPECTOMY    . CORONARY ANGIOPLASTY  09/25/2015   mid cir & om  . CORONARY ANGIOPLASTY WITH STENT PLACEMENT  10/09/2001   PTCA & stenting of mid AV circumflex; 2.5x4mm Pixel stent  . CORONARY ANGIOPLASTY WITH STENT PLACEMENT  12/13/2001   PCI with stent to mid L circumflex, 95% stenosis to 0% residual  . CORONARY ANGIOPLASTY WITH STENT PLACEMENT  10/10/2003   PCI to mid AV circumflex; LAD 30% disease; RCA 100% occluded prox.  . CORONARY ANGIOPLASTY WITH STENT PLACEMENT  09/01/2011   PCI with stenting with bare metal stent to mid AV groove circumflex and PDA  . CORONARY ANGIOPLASTY WITH STENT PLACEMENT  10/17/2011   cutting balloon angioplasty of ostial lateral OM1 branch and bifurcation AV groove circumflex OM  junction; stenosis reduced to 0%  . EXCISIONAL HEMORRHOIDECTOMY    . LEFT HEART CATHETERIZATION WITH CORONARY ANGIOGRAM N/A 10/18/2011   Procedure: LEFT HEART CATHETERIZATION WITH CORONARY ANGIOGRAM;  Surgeon: Leonie Man, MD;  Location: [facility] CATH LAB;  Service: Cardiovascular;  Laterality: N/A;  . LUMBAR LAMINECTOMY/DECOMPRESSION MICRODISCECTOMY  03/31/2012   Procedure: LUMBAR LAMINECTOMY/DECOMPRESSION MICRODISCECTOMY 1 LEVEL;  Surgeon:  Charlie Pitter, MD;  Location: [facility] NEURO ORS;  Service: Neurosurgery;  Laterality: Left;  . TRANSTHORACIC ECHOCARDIOGRAM  07/28/2011   EF 55-65%; LVH, grade 1 diastolic dysfunction;     Family History  Problem Relation Age of Onset  . Heart attack Father   . Hypertension Mother   . Diabetes Mother   . Heart disease Brother        x 3   . Heart attack Brother        deceased  . Hypertension Sister   . Diabetes Sister   . Anesthesia problems Neg Hx   . Hypotension Neg Hx   . Malignant hyperthermia Neg Hx   . Pseudochol deficiency Neg Hx     Social History Social History  Substance Use Topics  . Smoking status: Former Smoker    Packs/day: 0.25    Years: 25.00    Types: Cigarettes    Quit date: 05/24/2015  . Smokeless tobacco: Never Used  . Alcohol use No    Current Outpatient Prescriptions  Medication Sig Dispense Refill  . acetaminophen (TYLENOL) 500 MG tablet Take 1,000 mg by mouth every 6 (six) hours as needed for headache.    Marland Kitchen acetaminophen-codeine (TYLENOL #3) 300-30 MG tablet Take 1 tablet by mouth every 12 (twelve) hours as needed for moderate pain. 60 tablet 1  . Albuterol Sulfate (PROAIR RESPICLICK) 778 (90 Base) MCG/ACT AEPB Inhale 1 puff into the lungs every 6 (six) hours as needed (shortness of breath). 3 each 1  . amLODipine (NORVASC) 10 MG tablet Take 10 mg by mouth daily.  1  . aspirin 81 MG tablet Take 1 tablet (81 mg total) by mouth daily.    Marland Kitchen atorvastatin (LIPITOR) 40 MG tablet Take 1 tablet (40 mg total) by mouth daily. 90 tablet 1  . benzonatate (TESSALON) 200 MG capsule Take 200 mg by mouth daily.    . cetirizine (ZYRTEC) 10 MG tablet Take 1 tablet (10 mg total) by mouth daily. 30 tablet 1  . clopidogrel (PLAVIX) 75 MG tablet Take 1 tablet (75 mg total) by mouth daily. 90 tablet 3  . FLUoxetine (PROZAC) 20 MG tablet Take 1 tablet (20 mg total) by mouth daily. 90 tablet 1  . fluticasone (FLONASE) 50 MCG/ACT nasal spray Place 2 sprays into both nostrils daily.  16 g 6  . furosemide (LASIX) 20 MG tablet Take 20 mg by mouth 2 (two) times daily.    . hydrocortisone-pramoxine (ANALPRAM HC) 2.5-1 % rectal cream Place 1 application rectally 3 (three) times daily. 30 g 1  . isosorbide mononitrate (IMDUR) 30 MG 24 hr tablet Take 90mg  (3 tablets) in the morning and 60mg  (2 tablets) in the PM 90 tablet 3  . magic mouthwash SOLN Take 5 mLs by mouth daily. 50 mL 0  . metFORMIN (GLUCOPHAGE) 1000 MG tablet Take 1 tablet (1,000 mg total) by mouth 2 (two) times daily with a meal. 180 tablet 1  . mometasone (ASMANEX) 220 MCG/INH inhaler Inhale 2 puffs into the lungs daily. 3 Inhaler 1  . Nebivolol HCl 20 MG TABS Take 1 tablet (20 mg total)  by mouth daily. 90 tablet 3  . nitroGLYCERIN (NITROSTAT) 0.4 MG SL tablet Place 1 tablet (0.4 mg total) under the tongue every 5 (five) minutes as needed for chest pain. 25 tablet 2  . oxyCODONE-acetaminophen (PERCOCET/ROXICET) 5-325 MG tablet Take 1-2 tablets by mouth every 4 (four) hours as needed for severe pain. 10 tablet 0  . pantoprazole (PROTONIX) 40 MG tablet Take 1 tablet (40 mg total) by mouth daily. 90 tablet 1  . penicillin v potassium (VEETID) 500 MG tablet Take 1 tablet (500 mg total) by mouth 4 (four) times daily. 40 tablet 0  . pregabalin (LYRICA) 75 MG capsule Take 1 capsule (75 mg total) by mouth 3 (three) times daily. 270 capsule 3  . ranolazine (RANEXA) 1000 MG SR tablet Take 1 tablet (1,000 mg total) by mouth 2 (two) times daily. 180 tablet 3  . tiotropium (SPIRIVA HANDIHALER) 18 MCG inhalation capsule Place 1 capsule (18 mcg total) into inhaler and inhale daily. 30 capsule 3  . traZODone (DESYREL) 50 MG tablet Take 1 tablet (50 mg total) by mouth at bedtime as needed for sleep. 90 tablet 1  . valsartan-hydrochlorothiazide (DIOVAN-HCT) 320-25 MG tablet Take 1 tablet by mouth daily.  1   No current facility-administered medications for this visit.     Allergies  Allergen Reactions  . Iohexol Anaphylaxis    PT.  TO BE PREMEDICATED PRIOR TO IV CONTRAST PER DR Kris Hartmann /MMS//12/15/15Desc: PT BECAME SOB AND CHEST TIGHTNESS AFTER CONTRAST INJECTION.  [name],RT-RCT., Onset Date: [id number]     Review of Systems         Review of Systems :  [ y ] = yes, [  ] = no        General :  Weight gain [   ]    Weight loss  [   ]  Fatigue [ y ]  Fever [  ]  Chills  [  ]                                Weakness  [  ]           HEENT    Headache [  y]  Dizziness [  ]  Blurred vision [  ] Glaucoma  [  ]                          Nosebleeds [  ] Painful or loose teeth [y active dental infection by recent x-rays  ]        Cardiac :  Chest pain/ pressure [ y ]  Resting SOB [  ] exertional SOB [ y]                        Orthopnea [  ]  Pedal edema  [  ]  Palpitations [  ] Syncope/presyncope [ ]                         Paroxysmal nocturnal dyspnea [  ]         Pulmonary : cough [  ]  wheezing [  ]  Hemoptysis [  ] Sputum [  ] Snoring [  ]  Pneumothorax [  ]  Sleep apnea Blue.Reese  ]        GI : Vomiting [  ]  Dysphagia [  ]  Melena  [  ]  Abdominal pain [  ] BRBPR [  ]              Heart burn Blue.Reese  ]  Constipation [  ] Diarrhea  [  ] Colonoscopy [   ]        GU : Hematuria [  ]  Dysuria [  ]  Nocturia [  ] UTI's [  ]        Vascular : Claudication [  ]  Rest pain [  ]  DVT [  ] Vein stripping [  ] leg ulcers [  ]                          TIA [  ] Stroke [  ]  Varicose veins [  ]        NEURO :  Headaches  [  ] Seizures [  ] Vision changes [  ] Paresthesias [  ]                                       Seizures [  ]        Musculoskeletal :  Arthritis Blue.Reese  ] Gout  [  ]  Back pain Blue.Reese  ]  Joint pain [  ]        Skin :  Rash [  ]  Melanoma [  ] Sores [  ]        Heme : Bleeding problems [  ]Clotting Disorders [  ] Anemia [  ]Blood Transfusion [ ] bruising and bleeding while on Plavix        Endocrine : Diabetes [ y ] Heat or Cold intolerance [  ] Polyuria [  ]excessive thirst [ ]         Psych :  Depression [ y ]  Anxiety [  ]  Psych hospitalizations [  ] Memory change [  ]                                               BP 123/84   Pulse 80   Resp 20   Ht 5\' 8"  (1.727 m)   Wt 253 lb (114.8 kg)   SpO2 95%   BMI 38.47 kg/m  Physical Exam      Physical Exam  General: Very nice middle-aged male no acute distress accompanied by wife HEENT: Normocephalic pupils equal , dentition poor with missing and infected teeth Neck: Supple without JVD, adenopathy, or bruit Chest: Clear to auscultation, symmetrical breath sounds, no rhonchi, no tenderness             or deformity Cardiovascular: Regular rate and rhythm, no murmur, no gallop, peripheral pulses             palpable in all extremities Abdomen:  Soft, obese, nontender, no palpable mass or organomegaly Extremities: Warm, well-perfused, no clubbing cyanosis edema or tenderness,              no venous stasis changes of the legs Rectal/GU:  Deferred Neuro: Grossly non--focal and symmetrical throughout Skin: Clean and dry without rash or ulceration   Diagnostic Tests: Coronary angiograms carefully studied. Occluded RCA and circumflex vessels with multiple stents followed by distal small atretic vessels --  Non graftable. Good LV function Normal LAD, probable intramyocardial  Impression: Angina resistant to medical therapy. PCI no longer an option Diabetes Obesity COPD Hypertension Status post multiple PCI Plan: Patient would not benefit from CABG at this time for the above reasons. Agree with continued medical therapy. The situation was discussed with the patient his wife and all their questions addressed. Len Childs, MD [facility] [phone]

## 2017-06-08 ENCOUNTER — Encounter: Payer: Self-pay | Admitting: Family Medicine

## 2017-06-08 ENCOUNTER — Ambulatory Visit: Payer: BC Managed Care – PPO | Attending: Family Medicine | Admitting: Family Medicine

## 2017-06-08 ENCOUNTER — Telehealth: Payer: Self-pay | Admitting: Family Medicine

## 2017-06-08 VITALS — BP 142/85 | HR 73 | Temp 98.4°F | Ht 68.0 in | Wt 252.8 lb

## 2017-06-08 DIAGNOSIS — I25110 Atherosclerotic heart disease of native coronary artery with unstable angina pectoris: Secondary | ICD-10-CM | POA: Diagnosis not present

## 2017-06-08 DIAGNOSIS — Z9889 Other specified postprocedural states: Secondary | ICD-10-CM | POA: Insufficient documentation

## 2017-06-08 DIAGNOSIS — G4730 Sleep apnea, unspecified: Secondary | ICD-10-CM | POA: Diagnosis not present

## 2017-06-08 DIAGNOSIS — G8929 Other chronic pain: Secondary | ICD-10-CM | POA: Diagnosis not present

## 2017-06-08 DIAGNOSIS — Z9861 Coronary angioplasty status: Secondary | ICD-10-CM | POA: Diagnosis not present

## 2017-06-08 DIAGNOSIS — I200 Unstable angina: Secondary | ICD-10-CM

## 2017-06-08 DIAGNOSIS — I2582 Chronic total occlusion of coronary artery: Secondary | ICD-10-CM | POA: Diagnosis not present

## 2017-06-08 DIAGNOSIS — Z7902 Long term (current) use of antithrombotics/antiplatelets: Secondary | ICD-10-CM | POA: Diagnosis not present

## 2017-06-08 DIAGNOSIS — E119 Type 2 diabetes mellitus without complications: Secondary | ICD-10-CM | POA: Diagnosis present

## 2017-06-08 DIAGNOSIS — M5116 Intervertebral disc disorders with radiculopathy, lumbar region: Secondary | ICD-10-CM | POA: Insufficient documentation

## 2017-06-08 DIAGNOSIS — E870 Hyperosmolality and hypernatremia: Secondary | ICD-10-CM | POA: Diagnosis not present

## 2017-06-08 DIAGNOSIS — I10 Essential (primary) hypertension: Secondary | ICD-10-CM | POA: Diagnosis not present

## 2017-06-08 DIAGNOSIS — J449 Chronic obstructive pulmonary disease, unspecified: Secondary | ICD-10-CM | POA: Insufficient documentation

## 2017-06-08 DIAGNOSIS — K219 Gastro-esophageal reflux disease without esophagitis: Secondary | ICD-10-CM | POA: Diagnosis not present

## 2017-06-08 DIAGNOSIS — F419 Anxiety disorder, unspecified: Secondary | ICD-10-CM | POA: Insufficient documentation

## 2017-06-08 DIAGNOSIS — I2510 Atherosclerotic heart disease of native coronary artery without angina pectoris: Secondary | ICD-10-CM | POA: Diagnosis not present

## 2017-06-08 DIAGNOSIS — Z955 Presence of coronary angioplasty implant and graft: Secondary | ICD-10-CM | POA: Insufficient documentation

## 2017-06-08 DIAGNOSIS — F331 Major depressive disorder, recurrent, moderate: Secondary | ICD-10-CM | POA: Diagnosis not present

## 2017-06-08 DIAGNOSIS — Z888 Allergy status to other drugs, medicaments and biological substances status: Secondary | ICD-10-CM | POA: Insufficient documentation

## 2017-06-08 DIAGNOSIS — E785 Hyperlipidemia, unspecified: Secondary | ICD-10-CM | POA: Diagnosis not present

## 2017-06-08 DIAGNOSIS — E0800 Diabetes mellitus due to underlying condition with hyperosmolarity without nonketotic hyperglycemic-hyperosmolar coma (NKHHC): Secondary | ICD-10-CM

## 2017-06-08 LAB — POCT GLYCOSYLATED HEMOGLOBIN (HGB A1C): Hemoglobin A1C: 6

## 2017-06-08 LAB — GLUCOSE, POCT (MANUAL RESULT ENTRY): POC Glucose: 107 mg/dl — AB (ref 70–99)

## 2017-06-08 MED ORDER — ACETAMINOPHEN-CODEINE #3 300-30 MG PO TABS: 1.0000 | ORAL_TABLET | Freq: Two times a day (BID) | ORAL | 1 refills | Status: DC | PRN

## 2017-06-08 NOTE — Telephone Encounter (Signed)
Patient was seen today and he forgot to ask if he could get an order for diabetic shoes.  Please advised

## 2017-06-08 NOTE — Progress Notes (Signed)
Subjective:    Patient ID: Andrew West, male    DOB: May 13, 1966, 51 y.o.   MRN: [id number]  HPI  Andrew West is a 51 y.o. with a  Medical history of Type 2 DM (A1c 6.0), CAD s/p PCI/stent to LCx and OM1In 09/2015; cutting balloon angioplasty to OM 1, HLD, HTN, previous tobacco abuse (quit 2 years ago) chronic back pain s/p laminectomy/discectomy (3 years ago by Dr Dutch Quint), depression who comes into the clinic for follow-up visit.  He is sad because he was seen by cardiac surgery yesterday and informed that he would not be a candidate for CABG and he continues to have left arm pain and intermittent left sided chest pain; medical management was recommended. His medical conditions are overwhelming for him. He does have good social support and denies suicidal ideation or intent. His Cardiac cath from 11/2016 revealed total occlusion of the stented segment in the LCx,OM2 branch; suboptimal minimally effective PTCA was performed in mid CX-1 and mid CX-2 lesions with 50% residual stenosis post intervention) due to inability to pass a stent to desired location.  His cardiologist is Dr. Rennis Golden.   Diabetes is controlled on current regimen and he denies hypoglycemia.  He does have persisting low back pain which radiates to his legs and takes Tylenol No. 3 for this. Unable to see pain management to whom I had referred him due to his multiple appointments but promises to call to schedule an appointment.  He is currently on long-term disability and is planning to go to [place] to visit family for some time.   Past Medical History:  Diagnosis Date  . Anxiety   . CAD (coronary artery disease)    a. s/p multiple PCIs with last cath 11/2016 with severe multivessel CAD, s/p PCTA to LCx but unable to pass stent  . Chronic leg pain    bilateral  . Chronic lower back pain   . COPD (chronic obstructive pulmonary disease) (HCC)   . Depression   . GERD (gastroesophageal reflux disease)    Takes  Dexilant  . HLD (hyperlipidemia)   . Hypertension   . Rhabdomyolysis    h/o, r/t statins  . Sleep apnea    "can't tolerate mask" (12/16/2016)  . Type II diabetes mellitus (HCC)     Past Surgical History:  Procedure Laterality Date  . BACK SURGERY    . CARDIAC CATHETERIZATION N/A 09/25/2015   Procedure: Left Heart Cath and Coronary Angiography;  Surgeon: Marykay Lex, MD;  Location: [facility] INVASIVE CV LAB;  Service: Cardiovascular;  Laterality: N/A;  . CARDIAC CATHETERIZATION N/A 12/16/2016   Procedure: Left Heart Cath and Coronary Angiography;  Surgeon: Marykay Lex, MD;  Location: [facility] INVASIVE CV LAB;  Service: Cardiovascular;  Laterality: N/A;  . CARDIAC CATHETERIZATION N/A 12/16/2016   Procedure: Coronary Balloon Angioplasty;  Surgeon: Marykay Lex, MD;  Location: [facility] INVASIVE CV LAB;  Service: Cardiovascular;  Laterality: N/A;  . COLONOSCOPY W/ POLYPECTOMY    . CORONARY ANGIOPLASTY  09/25/2015   mid cir & om  . CORONARY ANGIOPLASTY WITH STENT PLACEMENT  10/09/2001   PTCA & stenting of mid AV circumflex; 2.5x31mm Pixel stent  . CORONARY ANGIOPLASTY WITH STENT PLACEMENT  12/13/2001   PCI with stent to mid L circumflex, 95% stenosis to 0% residual  . CORONARY ANGIOPLASTY WITH STENT PLACEMENT  10/10/2003   PCI to mid AV circumflex; LAD 30% disease; RCA 100% occluded prox.  . CORONARY ANGIOPLASTY WITH STENT PLACEMENT  09/01/2011  PCI with stenting with bare metal stent to mid AV groove circumflex and PDA  . CORONARY ANGIOPLASTY WITH STENT PLACEMENT  10/17/2011   cutting balloon angioplasty of ostial lateral OM1 branch and bifurcation AV groove circumflex OM junction; stenosis reduced to 0%  . EXCISIONAL HEMORRHOIDECTOMY    . LEFT HEART CATHETERIZATION WITH CORONARY ANGIOGRAM N/A 10/18/2011   Procedure: LEFT HEART CATHETERIZATION WITH CORONARY ANGIOGRAM;  Surgeon: Marykay Lex, MD;  Location: [facility] CATH LAB;  Service: Cardiovascular;  Laterality: N/A;  . LUMBAR  LAMINECTOMY/DECOMPRESSION MICRODISCECTOMY  03/31/2012   Procedure: LUMBAR LAMINECTOMY/DECOMPRESSION MICRODISCECTOMY 1 LEVEL;  Surgeon: Temple Pacini, MD;  Location: [facility] NEURO ORS;  Service: Neurosurgery;  Laterality: Left;  . TRANSTHORACIC ECHOCARDIOGRAM  07/28/2011   EF 55-65%; LVH, grade 1 diastolic dysfunction;     Allergies  Allergen Reactions  . Iohexol Anaphylaxis    PT. TO BE PREMEDICATED PRIOR TO IV CONTRAST PER DR Eppie Gibson /MMS//12/15/15Desc: PT BECAME SOB AND CHEST TIGHTNESS AFTER CONTRAST INJECTION.  [name],RT-RCT., Onset Date: [id number]       Review of Systems Constitutional: Negative for activity change and appetite change.  HENT: see HPI   Eyes: Negative for visual disturbance.  Respiratory: Negative for cough, chest tightness and shortness of breath.   Cardiovascular: see hpi.  Gastrointestinal:  negative for diarrhea or constipation Endocrine: Negative.   Genitourinary: Negative for dysuria.  Musculoskeletal: Positive for back pain. Negative for myalgias and joint swelling.  positive for left arm pain Skin: Negative for rash.  Allergic/Immunologic: Negative.   Neurological: Positive for weakness and numbness. Negative for light-headedness.  Psychiatric/Behavioral: Positive for suicidal ideation and intent    Objective: Vitals:   06/08/17 0931  BP: (!) 142/85  Pulse: 73  Temp: 98.4 F (36.9 C)  TempSrc: Oral  SpO2: 97%  Weight: 252 lb 12.8 oz (114.7 kg)  Height: 5\' 8"  (1.727 m)      Physical Exam Constitutional: He is oriented to person, place, and time. He appears well-developed and well-nourished.  HENT:  Right Ear: External ear normal.  Mouth/Throat: Oropharynx is clear and moist., Normal tympanic membranes bilaterally  Neck: No JVD, no tenderness  Cardiovascular: Normal rate, normal heart sounds and intact distal pulses.   No murmur heard. Pulmonary/Chest: Effort normal and breath sounds normal. He has no wheezes. He has no rales. He exhibits no  tenderness.  Abdominal: Soft. Bowel sounds are normal. He exhibits no distension and no mass. There is no tenderness.  Musculoskeletal: Normal range of motion.  Neurological: He is alert and oriented to person, place, and time.  Psych : dysphoric mood  CMP Latest Ref Rng & Units 05/10/2017 05/09/2017 03/15/2017  Glucose 65 - 99 mg/dL 540(J) 811(B) 98  BUN 6 - 20 mg/dL 11 10 11   Creatinine 0.61 - 1.24 mg/dL 1.47 8.29 5.62  Sodium 135 - 145 mmol/L 139 139 144  Potassium 3.5 - 5.1 mmol/L 3.8 3.5 4.1  Chloride 101 - 111 mmol/L 105 107 102  CO2 22 - 32 mmol/L 28 24 25   Calcium 8.9 - 10.3 mg/dL 8.3(L) 8.7(L) 9.1  Total Protein 6.0 - 8.5 g/dL - - 7.2  Total Bilirubin 0.0 - 1.2 mg/dL - - 0.3  Alkaline Phos 39 - 117 IU/L - - 62  AST 0 - 40 IU/L - - 13  ALT 0 - 44 IU/L - - 14    Lipid Panel     Component Value Date/Time   CHOL 106 03/15/2017 0940   TRIG 84 03/15/2017 0940  HDL 29 (L) 03/15/2017 0940   CHOLHDL 3.7 03/15/2017 0940   CHOLHDL 5.2 (H) 09/21/2016 0900   VLDL 31 (H) 09/21/2016 0900   LDLCALC 60 03/15/2017 0940      Lab Results  Component Value Date   HGBA1C 6.0 06/08/2017    Assessment & Plan:  1. Diabetes mellitus due to underlying condition with hyperosmolarity without coma, without long-term current use of insulin (HCC) Controlled with A1c of 6.0 - Glucose (CBG) - metFORMIN (GLUCOPHAGE) 1000 MG tablet; Take 1 tablet (1,000 mg total) by mouth 2 (two) times daily with a meal.  Dispense: 180 tablet; Refill: 1 Has upcoming appointment with ophthalmology next month  2. COPD with asthma (HCC) No acute exacerbation - Albuterol Sulfate (PROAIR RESPICLICK) 108 (90 Base) MCG/ACT AEPB; Inhale 1 puff into the lungs every 6 (six) hours as needed (shortness of breath).  Dispense: 3 each; Refill: 1 - mometasone (ASMANEX) 220 MCG/INH inhaler; Inhale 2 puffs into the lungs daily.  Dispense: 3 Inhaler; Refill: 1  3. Essential hypertension Slightly elevated above goal of less than  130/80 No regimen changes today Low-sodium diet - amLODipine (NORVASC) 10 MG tablet; Take 1 tablet (10 mg total) by mouth daily.  Dispense: 90 tablet; Refill: 1 - valsartan-hydrochlorothiazide (DIOVAN HCT) 320-25 MG tablet; Take 1 tablet by mouth daily.  Dispense: 90 tablet; Refill: 1  4. CAD s/p mutiple PCI With ongoing unstable angina Recently seen by cardiac surgery - would not benefit from CABG at this time Risk factor modification - atorvastatin (LIPITOR) 40 MG tablet; Take 1 tablet (40 mg total) by mouth daily.  Dispense: 90 tablet; Refill: 1 - clopidogrel (PLAVIX) 75 MG tablet; Take 1 tablet (75 mg total) by mouth daily.  Dispense: 90 tablet; Refill: 1  5. Moderate episode of recurrent major depressive disorder (HCC) Stable - FLUoxetine (PROZAC) 20 MG tablet; Take 1 tablet (20 mg total) by mouth daily.  Dispense: 90 tablet; Refill: 1  6. Gastroesophageal reflux disease without esophagitis Controlled - pantoprazole (PROTONIX) 40 MG tablet; Take 1 tablet (40 mg total) by mouth daily.  Dispense: 90 tablet; Refill: 1  7. Lumbar disc herniation with radiculopathy Currently on Tylenol 3 - discuss constipating side effect, he knows to increase fiber intake and requests a laxative if that occurs. Previously referred to pain management but he was never able to go Advised to call Pain Management to reschedule Urine drug screen today  This note has been created with Education officer, environmental. Any transcriptional errors are unintentional.   This note has been created with Education officer, environmental. Any transcriptional errors are unintentional.

## 2017-06-08 NOTE — Patient Instructions (Signed)

## 2017-06-09 NOTE — Telephone Encounter (Signed)
I have referred him to podiatry who will order the shoes for him.

## 2017-06-20 LAB — DRUG SCREEN 12+ALCOHOL+CRT, UR
Amphetamines, Urine: NEGATIVE ng/mL
BARBITURATE: NEGATIVE ng/mL
BENZODIAZ UR QL: NEGATIVE ng/mL
COCAINE (METABOLITE): NEGATIVE ng/mL
CREATININE, RANDOM U: 276.2 mg/dL (ref 20.0–300.0)
Cannabinoids: NEGATIVE ng/mL
Ethanol U, Quan: NEGATIVE %
MEPERIDINE: NEGATIVE ng/mL
Methadone: NEGATIVE ng/mL
OPIATE SCREEN URINE: NEGATIVE ng/mL
Oxycodone/Oxymorphone, Urine: POSITIVE — AB
PHENCYCLIDINE: NEGATIVE ng/mL
PROPOXYPHENE: NEGATIVE ng/mL
Tramadol: NEGATIVE ng/mL

## 2017-07-04 ENCOUNTER — Other Ambulatory Visit: Payer: Self-pay | Admitting: Family Medicine

## 2017-07-04 ENCOUNTER — Ambulatory Visit: Payer: BC Managed Care – PPO | Admitting: Podiatry

## 2017-07-04 DIAGNOSIS — G4709 Other insomnia: Secondary | ICD-10-CM

## 2017-07-04 DIAGNOSIS — Z955 Presence of coronary angioplasty implant and graft: Secondary | ICD-10-CM

## 2017-07-04 DIAGNOSIS — K219 Gastro-esophageal reflux disease without esophagitis: Secondary | ICD-10-CM

## 2017-07-04 DIAGNOSIS — F331 Major depressive disorder, recurrent, moderate: Secondary | ICD-10-CM

## 2017-07-04 DIAGNOSIS — I10 Essential (primary) hypertension: Secondary | ICD-10-CM

## 2017-07-04 MED FILL — AMLODIPINE BESYLATE 10 MG T: 10 | 30 days supply | Qty: 30 | Fill #5

## 2017-07-04 MED FILL — traZODone HCL 50 MG TABS: 50 | 30 days supply | Qty: 30 | Fill #0

## 2017-07-04 MED FILL — metFORMIN HCL 1000 MG TABS: 1000 | 30 days supply | Qty: 60 | Fill #2

## 2017-07-04 MED FILL — hydrALAZINE HCL 25 MG TABS: 25 | 30 days supply | Qty: 60 | Fill #2

## 2017-07-04 MED FILL — ISOSORBIDE MN ER 30 MG TAB: 30 | 30 days supply | Qty: 120 | Fill #0

## 2017-07-04 MED FILL — PANTOPRAZOLE SOD DR 40 MG T: 40 | 30 days supply | Qty: 30 | Fill #3

## 2017-07-04 MED FILL — RANEXA ER 1,000 MG TABLET: 1000 | 30 days supply | Qty: 60 | Fill #1

## 2017-07-04 MED FILL — VALSARTAN-HCTZ 320-25 MG TA: 320-25 | 30 days supply | Qty: 30 | Fill #0

## 2017-07-04 MED FILL — CLOPIDOGREL 75 MG TABLET: 75 | 30 days supply | Qty: 30 | Fill #1

## 2017-07-04 MED FILL — FLUoxetine HCL 20 MG CAPS: 20 | 30 days supply | Qty: 30 | Fill #5

## 2017-07-06 ENCOUNTER — Telehealth: Payer: Self-pay | Admitting: Internal Medicine

## 2017-07-06 NOTE — Telephone Encounter (Signed)
Patient aware of clearance.  Clearance routed.

## 2017-07-06 NOTE — Telephone Encounter (Addendum)
Patient aware that clearance from [facility] was received. He states he needs 22 teeth removed. He prefers to have procedure in the hospital.   1. Type of surgery: surgical extraction of almost all remaining upper & lower teeth, bony reduction (alveoloplasty) of upper & lower jaw bone under deep IV sedation (Versed, Fentanyl, Propofol) in the office 2. Date of surgery: TBS 3. Surgeon: Dr. Debbora Presto 4. Medications that need to be held & how long: per faxed clearance, DDS does not plan on advising patient to discontinue plavix or ASA peri-operatively, unless OK w/cardiologist  5. Need for antibiotic prophylaxis:  6. Fax and/or Phone: (P) [phone]  (F) [phone]

## 2017-07-06 NOTE — Telephone Encounter (Signed)
New Message     Did you received paperwork from oral surgeon yet?

## 2017-07-06 NOTE — Telephone Encounter (Signed)
[place] for dental extraction. If it can be done on Aspirin and plavix that is fine.  Dr. Lemmie Evens

## 2017-07-12 ENCOUNTER — Telehealth: Payer: Self-pay | Admitting: Family Medicine

## 2017-07-12 ENCOUNTER — Telehealth: Payer: Self-pay | Admitting: Internal Medicine

## 2017-07-12 ENCOUNTER — Ambulatory Visit (INDEPENDENT_AMBULATORY_CARE_PROVIDER_SITE_OTHER): Payer: Medicaid Other | Admitting: Internal Medicine

## 2017-07-12 ENCOUNTER — Encounter: Payer: Self-pay | Admitting: Internal Medicine

## 2017-07-12 ENCOUNTER — Other Ambulatory Visit: Payer: Self-pay

## 2017-07-12 VITALS — BP 130/84 | HR 77 | Ht 69.0 in | Wt 250.8 lb

## 2017-07-12 DIAGNOSIS — R079 Chest pain, unspecified: Secondary | ICD-10-CM | POA: Diagnosis not present

## 2017-07-12 DIAGNOSIS — Z01810 Encounter for preprocedural cardiovascular examination: Secondary | ICD-10-CM | POA: Diagnosis not present

## 2017-07-12 DIAGNOSIS — I2510 Atherosclerotic heart disease of native coronary artery without angina pectoris: Secondary | ICD-10-CM | POA: Diagnosis not present

## 2017-07-12 DIAGNOSIS — I208 Other forms of angina pectoris: Secondary | ICD-10-CM

## 2017-07-12 DIAGNOSIS — Z9861 Coronary angioplasty status: Secondary | ICD-10-CM | POA: Diagnosis not present

## 2017-07-12 DIAGNOSIS — I202 Refractory angina pectoris: Secondary | ICD-10-CM

## 2017-07-12 MED ORDER — BLOOD PRESSURE CUFF MISC: 1.0000 [IU] | Freq: Once | 0 refills | Status: DC

## 2017-07-12 MED ORDER — ACCU-CHEK AVIVA PLUS W/DEVICE KIT: PACK | 0 refills | Status: DC

## 2017-07-12 MED ORDER — GLUCOSE BLOOD VI STRP: ORAL_STRIP | 12 refills | Status: DC

## 2017-07-12 MED ORDER — PREGABALIN 75 MG PO CAPS: 75.0000 mg | ORAL_CAPSULE | Freq: Three times a day (TID) | ORAL | 3 refills | Status: DC

## 2017-07-12 NOTE — Telephone Encounter (Signed)
Pt BP cuff and glucose machine has been sent over to [facility] on [address].

## 2017-07-12 NOTE — Patient Instructions (Signed)
Your physician wants you to follow-up in: 6 months with Dr. Hilty. You will receive a reminder letter in the mail two months in advance. If you don't receive a letter, please call our office to schedule the follow-up appointment.    

## 2017-07-12 NOTE — Progress Notes (Signed)
OFFICE NOTE  Chief Complaint:  Persistent chest pain, hospital follow-up  Primary Care Physician: Arnoldo Morale, MD  HPI:  Andrew West  is a 51 year old gentleman with a history of coronary artery disease and stent placement to the circumflex and obtuse marginal and a history of in stent restenosis to the LAD. There is also a nondominant right coronary artery that is 100% occluded, filled with collaterals. He did have a stress test recently, which showed an EF of 53% and reversible septal ischemia in the setting of chest pain and after much convincing underwent cardiac catheterization.  He then underwent cutting balloon angioplasty to an ostial lateral OM1 branch and the bifurcation AV groove circumflex OM junction. This was successful at reducing the stenosis to 0%. This was in November 2012 and he did not return for followup appointment until April 2013. He was suffering from low back pain and has been evaluated and treated by Dr. Trenton Gammon with a laminectomy/discectomy, which he says was not helpful. Otherwise, he continues to smoke and occasionally complains of shortness of breath and some chest pain symptoms which are atypical. He is on long-acting and/or has not needed to take short-acting nitroglycerin. His other concern today is that he is having problems with his teeth and was recommended to have edentulation and by an oral surgeon Dr. Diona Browner. Finally, he is suffering from significant stress and depression due to recent separation with wife in dealing with his kids. This seems to be a big tablets on his continued smoking.  Andrew West returns today in the office. He feels fairly well. He denies any chest pain. He continues to have problems with low back pain and numbness and tingling in his legs. He is apparently status post laminectomy and microdiscectomy by Dr. Trenton Gammon and continues to have symptoms which may be related to that. He's also complaining of what sounds like  neuropathic pain. He is not currently on medication. He is asking for Tylenol 3 for pain today which I told him I did not prescribe. Ultimately there are no further surgical or nonsurgical options, he may need to go on a neuropathic pain medication or perhaps be referred to a pain management specialist. He is also looking for a new primary care provider as his primary care provider is close to retirement. He reports he has cut back his smoking to about 1 pack every 2 weeks. He is also been out of amlodipine and simvastatin although his blood pressure is well controlled.  I saw Andrew West in the office today. He is reporting now complains of shortness of breath and chest pain. He also feels like his breathing is worse when working around chemicals at work. He uses a respiratory protection mask but also feels like he is under significant stress. He's had missed several days of work and is concerned about his job. Sounds like he is in a difficult work environment. He says that he is apologizing for "deceiving me" that he was not honest about his symptoms. He apparently has been having shortness of breath and chest pain for several months and did not relay that to me.  Andrew West returns to the office today for follow-up. His main complaint is shortness of breath. He's not feeling as much chest pain he's had been previously. We recently did a nuclear stress test which was negative for ischemia and showed an EF of 56%. With regards to shortness of breath he's concerned most of this was related to chemicals at  work although he has not worked in several months. He's had about 20 pound weight gain since we last saw him in the office and denies any lower extremity swelling, orthopnea or PND. Shortness of breath is persistent and is associated with some wheezing. He did see Dr. 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 Shortness of breath seems to be a little bit worse with exertion which makes me wonder whether there is an element of exercise-induced pulmonary hypertension. He's not had an echocardiogram in some time.  Andrew West returns today for follow-up. He was noted to have some diastolic dysfunction on his echocardiogram. I started him on low-dose Lasix and check lab work including a BNP which is very low around 50. On follow-up today he reports he feels no different with the addition of Lasix. I therefore asked him to take it as needed. He is also describing worsening substernal chest discomfort and a squeezing pressure in his chest today. He says this is been getting worse over the past several days, more than his typical chest discomfort. As previously noted he recently underwent a nuclear stress test a few months ago which was negative for ischemia.   Andrew West returns for follow-up. As mentioned he had had recent progressive chest pain symptoms and worsening shortness of breath. He underwent another cardiac catheterization which demonstrated the following:   Ost 2nd Mrg to 2nd Mrg lesion, 99% stenosed. Post intervention, 99% residual stenosis remained. The lesion was previously treated with a bare metal stentgreater than two years ago.  Mid Cx-2 lesion, 90% stenosed. Post intervention, there is a 20% residual stenosis.  Mid Cx-1 lesion, 80% stenosed. Post intervention, there is a 50% residual stenosis. The lesion was previously treated with a bare metal stent.  Ramus lesion, 100% stenosed. The lesion was previously treated with a bare metal stentgreater than two years ago.  Prox RCA lesion, 100% stenosed.  There is mild left ventricular systolic dysfunction.  Moderately elevated LVEDP of 20 mmHg   Difficult situation with what amounts to be a totally occluded (In-stent re-stenosis) of the OM2 branch with sub-optimal PTCA of the mid AV-Groove In-stent  restenosis. He continues to have chest pain although reports it somewhat improved. He was started on ranolazine and currently is on 1000 mg twice a day.  Andrew West returns today for follow-up. He reports he is doing fairly well on medical therapy. He still gets some sharp chest pain mostly along the right sternal border. He does get short of breath with moderate exertion. He's trying to do some walking on a treadmill but generally stops the exercise once he gets short of breath because he is very nervous. He is not currently working due to combined cardiac pulmonary and musculoskeletal diseases. He says that he and his wife are going to go on a delayed honeymoon since they never took one.  04/20/2016  Andrew West returns today for follow-up. He was recently seen in the hospital in the beginning of May for chest pain. He reports that it's up in the left neck base around the area of the left clavicle. It's very exquisitely tender to touch and feels sharp and electric. This pain is related we think to cervical pain. He ruled out for MI and was not worked up further for coronary ischemia. This pain is felt to be distinctly different. His  isosorbide was increased up to 60 mg twice a day but he notes no change in his symptoms with that. His primary care providers hesitant to provide any pain medication.  06/28/2016  Andrew West was seen back today in follow-up. He recently was in the ER at [facility] the left without being seen after 3 hours. An EKG was performed and showed no ischemic changes. He reported going home and taking some nitroglycerin and aspirin with some eventual improvement in his symptoms. He continues to have a sharp left chest discomfort which is tender to the touch and is persistent. It's not necessarily worse with exertion or relieved by rest. I felt this may likely be a neuropathic pain and I recommended starting gabapentin 300 mg daily at bedtime. He reports no improvement with this medication. I  advised him to discontinue today. He's also concerned about poor sleep at night, significant preoccupation with death and anxiety. This is concerning for possible PTSD type symptoms.  08/23/2016  Andrew West returns today for follow-up. I referred him to behavioral health and he says that he went but it was not helpful. The medical record however does not show any evidence that he made that appointment. He did present to the emergency department on 06/29/2016 with depression and anxiety and was felt not to need inpatient treatment. He underwent nuclear stress testing which was negative for ischemia and showed normal LV function. I do not believe is ongoing chest pain at this time is due to ischemia. Blood pressure is elevated today however 142/102. A recheck was 138/89.  11/03/2016  I saw Andrew West today for follow-up again. He is complaining of persistent chest pain which is chronic, tends to be present for most of the day at rest and with exertion. He also complains of shortness of breath. Surprisingly blood pressure is elevated today, much higher than it has been in the past at 150/105. He reports compliance with his medications. This could be contributing to his problems. He is interested in another heart catheterization. I did remind him that we performed a stress test only 3 months ago which was negative for ischemia. His last coronary intervention was about a year ago which she had some percutaneous angioplasty but no new stent placement. He has complex anatomy that was not amenable to PCI, rather medical therapy was recommended however he has been on maximal doses of nitrates, Ranexa, beta blocker and other blood pressure medications. Interestingly, despite a high dose of metoprolol, his heart rate remains up in the 80s. EKG today is nonischemic.  12/02/2016  Andrew West returns today for follow-up. He reports despite changing his medications which has resulted in improvement in blood pressure  that he still has significant chest discomfort and shortness of breath. He is certain that there is a new blockage in his heart that the cause of this. He believes he needs another heart catheterization. Symptoms seem similar to his symptoms prior to his last cutting balloon angioplasty more than 2 years ago.  01/17/2017  Andrew West seen today in follow-up. He underwent recent repeat coronary catheterization which showed no clear targets for intervention. He did have some small distal disease which was angioplastied and noted a small improvement in his symptoms. His nitroglycerin was increased which I think is also contributing to his improvement. At this point I think we need to continue with medical therapy. We could also possibly consider adding some pain medication, perhaps a neuropathic pain medicine is her may be a complex regional  pain syndrome or other component of recurrent chest pain beyond ischemia.  03/24/2017  Andrew West seen today in follow-up. He continues to have pain in the chest. I don't believe it's anginal. I suspect he might have a complex regional pain syndrome. He is taken gabapentin in the past without much success but does not take it regularly. He may benefit from stronger neuropathic pain medicine such as pregabalin. I discussed with the pharmacist today and we ran an interaction check with his current medications. Potential interaction his Prozac with a slight increased risk of serotonin syndrome. I advised him to monitor that and look for symptoms such as fever or muscle rigidity.  05/24/2017  Andrew West returns today for hospital follow-up. He was again in the hospital for chest pain. Cardiac enzymes were negative. He was seen by one of my partners and recommended follow-up with me. Andrew West reports recurrent chest pain which seems to be nitrate responsive, despite being on high-dose indoor 90 mg every morning and 60 mg every afternoon, pregabalin, Ranexa thousand  milligrams twice a day, high-dose amlodipine, beta blocker, and other treatments for angina, with very well controlled LDL-C less than 60. He is currently on maximal medical therapy. In January he had PCI to the circumflex for in-stent restenosis with a suboptimal result. I reviewed his coronary anatomy today and cath films personally with Dr. Peter Martinique, to see if he was a candidate for chronic total occlusion PCI. His feeling is that there are no real percutaneous options for him. He had at least a diffuse area 50% stenosis of the circumflex with a small distal vessel, close to 50% mid to distal LAD disease and an ostial/proximally occluded RCA with very faint left to right collaterals. Based on these findings, surgical opinion was suggested - although targets are arguably small.   07/12/2017  Andrew West was seen today in follow-up. He recently was seen in the emergency department for tooth and jaw pain. He has significant dental caries and will need global extraction. I've ready provided cardiac clearance for this which apparently can be done on aspirin and Plavix. He's also had back pain which has been chronic. I prefer him to see Dr. Lucianne Lei tried for surgical consultation however it is felt that he's not a candidate for bypass at this time unless he were to develop LAD disease. He has small vessels which are poor targets for grafting. Medical therapy is recommended.  PMHx:  Past Medical History:  Diagnosis Date  . Anxiety   . CAD (coronary artery disease)    a. s/p multiple PCIs with last cath 11/2016 with severe multivessel CAD, s/p PCTA to LCx but unable to pass stent  . Chronic leg pain    bilateral  . Chronic lower back pain   . COPD (chronic obstructive pulmonary disease) ([place])   . Depression   . GERD (gastroesophageal reflux disease)    Takes Dexilant  . HLD (hyperlipidemia)   . Hypertension   . Rhabdomyolysis    h/o, r/t statins  . Sleep apnea    "can't tolerate mask" (12/16/2016)    . Type II diabetes mellitus ([place])     Past Surgical History:  Procedure Laterality Date  . BACK SURGERY    . CARDIAC CATHETERIZATION N/A 09/25/2015   Procedure: Left Heart Cath and Coronary Angiography;  Surgeon: Leonie Man, MD;  Location: [facility];  Service: Cardiovascular;  Laterality: N/A;  . CARDIAC CATHETERIZATION N/A 12/16/2016   Procedure: Left Heart Cath  and Coronary Angiography;  Surgeon: Leonie Man, MD;  Location: [facility];  Service: Cardiovascular;  Laterality: N/A;  . CARDIAC CATHETERIZATION N/A 12/16/2016   Procedure: Coronary Balloon Angioplasty;  Surgeon: Leonie Man, MD;  Location: [facility];  Service: Cardiovascular;  Laterality: N/A;  . COLONOSCOPY W/ POLYPECTOMY    . CORONARY ANGIOPLASTY  09/25/2015   mid cir & om  . CORONARY ANGIOPLASTY WITH STENT PLACEMENT  10/09/2001   PTCA & stenting of mid AV circumflex; 2.5x7mm Pixel stent  . CORONARY ANGIOPLASTY WITH STENT PLACEMENT  12/13/2001   PCI with stent to mid L circumflex, 95% stenosis to 0% residual  . CORONARY ANGIOPLASTY WITH STENT PLACEMENT  10/10/2003   PCI to mid AV circumflex; LAD 30% disease; RCA 100% occluded prox.  . CORONARY ANGIOPLASTY WITH STENT PLACEMENT  09/01/2011   PCI with stenting with bare metal stent to mid AV groove circumflex and PDA  . CORONARY ANGIOPLASTY WITH STENT PLACEMENT  10/17/2011   cutting balloon angioplasty of ostial lateral OM1 branch and bifurcation AV groove circumflex OM junction; stenosis reduced to 0%  . EXCISIONAL HEMORRHOIDECTOMY    . LEFT HEART CATHETERIZATION WITH CORONARY ANGIOGRAM N/A 10/18/2011   Procedure: LEFT HEART CATHETERIZATION WITH CORONARY ANGIOGRAM;  Surgeon: Leonie Man, MD;  Location: [facility] CATH LAB;  Service: Cardiovascular;  Laterality: N/A;  . LUMBAR LAMINECTOMY/DECOMPRESSION MICRODISCECTOMY  03/31/2012   Procedure: LUMBAR LAMINECTOMY/DECOMPRESSION MICRODISCECTOMY 1 LEVEL;  Surgeon: Charlie Pitter, MD;  Location: [facility]  NE[facility];  Service: Neurosurgery;  Laterality: Left;  . TRANSTHORACIC ECHOCARDIOGRAM  07/28/2011   EF 55-65%; LVH, grade 1 diastolic dysfunction;     FAMHx:  Family History  Problem Relation Age of Onset  . Heart attack Father   . Hypertension Mother   . Diabetes Mother   . Heart disease Brother        x 3   . Heart attack Brother        deceased  . Hypertension Sister   . Diabetes Sister   . Anesthesia problems Neg Hx   . Hypotension Neg Hx   . Malignant hyperthermia Neg Hx   . Pseudochol deficiency Neg Hx     SOCHx:   reports that he quit smoking about 2 years ago. His smoking use included Cigarettes. He has a 6.25 pack-year smoking history. He has never used smokeless tobacco. He reports that he does not drink alcohol or use drugs.  ALLERGIES:  Allergies  Allergen Reactions  . Iohexol Anaphylaxis    PT. TO BE PREMEDICATED PRIOR TO IV CONTRAST PER DR Kris Hartmann /MMS//12/15/15Desc: PT BECAME SOB AND CHEST TIGHTNESS AFTER CONTRAST INJECTION.  [name],RT-RCT., Onset Date: [id number]     ROS: Pertinent items noted in HPI and remainder of comprehensive ROS otherwise negative.  HOME MEDS: Current Outpatient Prescriptions  Medication Sig Dispense Refill  . acetaminophen (TYLENOL) 500 MG tablet Take 1,000 mg by mouth every 6 (six) hours as needed for headache.    Marland Kitchen acetaminophen-codeine (TYLENOL #3) 300-30 MG tablet Take 1 tablet by mouth every 12 (twelve) hours as needed for moderate pain. 60 tablet 1  . Albuterol Sulfate (PROAIR RESPICLICK) 623 (90 Base) MCG/ACT AEPB Inhale 1 puff into the lungs every 6 (six) hours as needed (shortness of breath). 3 each 1  . amLODipine (NORVASC) 10 MG tablet TAKE 1 TABLET BY MOUTH DAILY. 90 tablet 0  . aspirin 81 MG tablet Take 1 tablet (81 mg total) by mouth daily.    Marland Kitchen  atorvastatin (LIPITOR) 40 MG tablet Take 1 tablet (40 mg total) by mouth daily. 90 tablet 1  . cetirizine (ZYRTEC) 10 MG tablet Take 1 tablet (10 mg total) by mouth daily.  30 tablet 1  . clopidogrel (PLAVIX) 75 MG tablet Take 1 tablet (75 mg total) by mouth daily. 90 tablet 3  . FLUoxetine (PROZAC) 20 MG capsule TAKE 1 TABLET BY MOUTH DAILY. 90 capsule 0  . fluticasone (FLONASE) 50 MCG/ACT nasal spray Place 2 sprays into both nostrils daily. 16 g 6  . furosemide (LASIX) 20 MG tablet Take 20 mg by mouth 2 (two) times daily.    . hydrocortisone-pramoxine (ANALPRAM HC) 2.5-1 % rectal cream Place 1 application rectally 3 (three) times daily. 30 g 1  . isosorbide mononitrate (IMDUR) 30 MG 24 hr tablet TAKE 2 TABLETS BY MOUTH 2 TIMES DAILY. 120 tablet 2  . metFORMIN (GLUCOPHAGE) 1000 MG tablet Take 1 tablet (1,000 mg total) by mouth 2 (two) times daily with a meal. 180 tablet 1  . mometasone (ASMANEX) 220 MCG/INH inhaler Inhale 2 puffs into the lungs daily. 3 Inhaler 1  . Nebivolol HCl 20 MG TABS Take 1 tablet (20 mg total) by mouth daily. 90 tablet 3  . nitroGLYCERIN (NITROSTAT) 0.4 MG SL tablet Place 1 tablet (0.4 mg total) under the tongue every 5 (five) minutes as needed for chest pain. 25 tablet 2  . oxyCODONE-acetaminophen (PERCOCET/ROXICET) 5-325 MG tablet Take 1-2 tablets by mouth every 4 (four) hours as needed for severe pain. 10 tablet 0  . pantoprazole (PROTONIX) 40 MG tablet Take 1 tablet (40 mg total) by mouth daily. 90 tablet 1  . penicillin v potassium (VEETID) 500 MG tablet Take 1 tablet (500 mg total) by mouth 4 (four) times daily. 40 tablet 0  . pregabalin (LYRICA) 75 MG capsule Take 1 capsule (75 mg total) by mouth 3 (three) times daily. 270 capsule 3  . ranolazine (RANEXA) 1000 MG SR tablet Take 1 tablet (1,000 mg total) by mouth 2 (two) times daily. 180 tablet 3  . tiotropium (SPIRIVA HANDIHALER) 18 MCG inhalation capsule Place 1 capsule (18 mcg total) into inhaler and inhale daily. 30 capsule 3  . traZODone (DESYREL) 50 MG tablet TAKE 1 TABLET BY MOUTH AT BEDTIME AS NEEDED FOR SLEEP. 30 tablet 2  . valsartan-hydrochlorothiazide (DIOVAN-HCT) 320-25 MG  tablet TAKE 1 TABLET BY MOUTH DAILY. 90 tablet 0   No current facility-administered medications for this visit.     LABS/IMAGING: No results found for this or any previous visit (from the past 48 hour(s)). No results found.  VITALS: BP 130/84   Pulse 77   Ht 5\' 9"  (1.753 m)   Wt 250 lb 12.8 oz (113.8 kg)   SpO2 97%   BMI 37.04 kg/m   EXAM: General appearance: alert and no distress Neck: no carotid bruit, no JVD and thyroid not enlarged, symmetric, no tenderness/mass/nodules Lungs: clear to auscultation bilaterally Heart: regular rate and rhythm, S1, S2 normal, no murmur, click, rub or gallop Abdomen: soft, non-tender; bowel sounds normal; no masses,  no organomegaly Extremities: extremities normal, atraumatic, no cyanosis or edema Pulses: 2+ and symmetric Skin: Skin color, texture, turgor normal. No rashes or lesions Neurologic: Grossly normal Psych: Pleasant  EKG: Deferred  ASSESSMENT: 1. Recurrent chest pain and dyspnea at rest and with exertion - Occluded OM2 at a prior stent - no good       revascularization options - low risk Myoview with no ischemia (07/2016) 2. Coronary artery  disease status post PCI and cutting balloon angioplasty (11/2016) 3. Ongoing tobacco abuse 4. Uncontrolled hypertension 5. Dyslipidemia 6. 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 Persistent low back pain 8. Diabetes type 2 9. Morbid obesity 10. Anxiety/?PTSD 11. Acceptable risk for dental extraction  PLAN: 1.   Andrew West. Macmillan continues to have chest pain with exertion and at times at rest. He is on maximal medical therapy. He's had a surgical evaluation but is not a candidate for bypass. Medical therapy was recommended. He's about to undergo significant dental extraction for caries and this apparently can be done on aspirin and Plavix. Hopefully there will be too much for bleeding risk. We'll plan to see him back in 6 months or sooner as necessary.  Pixie Casino, MD, [facility] Attending  Cardiologist [place] 07/12/2017, 8:20 AM

## 2017-07-12 NOTE — Telephone Encounter (Signed)
Returned call to patient prescription for home B/P cuff sent to pharmacy.

## 2017-07-12 NOTE — Telephone Encounter (Signed)
Pt called to request a BP reader for home and Diabetes kit as well,he woul like to know if you will auth. this can you sent it to the [facility] pharmacy, Pt has Medicaid insurance.

## 2017-07-12 NOTE — Telephone Encounter (Signed)
Returned call to patient.He stated he would like a prescription for a B/P cuff.Stated medicaid will pay with a prescription.Advised to call PCP for blood sugar machine.

## 2017-07-12 NOTE — Telephone Encounter (Signed)
Patient calling, states that he needs a "digital BP cuff and machine to check diabetes."  Patient is not sure if he should call PCP or our office.

## 2017-07-13 ENCOUNTER — Telehealth: Payer: Self-pay | Admitting: Family Medicine

## 2017-07-13 ENCOUNTER — Telehealth: Payer: Self-pay | Admitting: Internal Medicine

## 2017-07-13 DIAGNOSIS — M5010 Cervical disc disorder with radiculopathy, unspecified cervical region: Secondary | ICD-10-CM

## 2017-07-13 DIAGNOSIS — M5116 Intervertebral disc disorders with radiculopathy, lumbar region: Secondary | ICD-10-CM

## 2017-07-13 NOTE — Telephone Encounter (Signed)
Patient called requesting a new referral to pain management, pt had been referred previously around 7 months ago but needs new referral. Please f/up

## 2017-07-13 NOTE — Telephone Encounter (Signed)
error 

## 2017-07-13 NOTE — Telephone Encounter (Signed)
Will route to PCP 

## 2017-07-13 NOTE — Telephone Encounter (Signed)
Per nuclear medicine, a call from [facility] was transferred to them on this patient. Returned call to this office and they state they need office notes + labs (jan 2018 ordered by Dr. Debara Pickett) for clearance for oral surgery.   Faxed via [employer] to [phone]

## 2017-07-14 NOTE — Telephone Encounter (Signed)
Referral placed.

## 2017-07-18 ENCOUNTER — Telehealth: Payer: Self-pay | Admitting: Internal Medicine

## 2017-07-18 NOTE — Telephone Encounter (Signed)
New message    Pt is returning call to nurse. Please call.

## 2017-07-18 NOTE — Telephone Encounter (Signed)
Spoke to [facility] [phone]). They stated they were still waiting in office note and labs.   Verified fax number: [phone]. Will fax today. Oral Surgery and left message for pt to call back.

## 2017-07-18 NOTE — Telephone Encounter (Signed)
Pt returned call and said [facility] did receive information needed. Pt verbalized thanks.

## 2017-07-18 NOTE — Telephone Encounter (Signed)
lmtcb--see previous telephone note concerning [facility].

## 2017-07-18 NOTE — Telephone Encounter (Signed)
Patient calling, states that he spoke with oral surgeon and they are still waiting on the documents from our office. Patient asks that our office please fax paperwork.. Patient states that you would have all the information and could not provide information

## 2017-07-20 ENCOUNTER — Ambulatory Visit: Payer: BC Managed Care – PPO | Admitting: Podiatry

## 2017-08-08 ENCOUNTER — Telehealth: Payer: Self-pay | Admitting: Family Medicine

## 2017-08-08 ENCOUNTER — Ambulatory Visit: Payer: BC Managed Care – PPO | Admitting: Podiatry

## 2017-08-08 DIAGNOSIS — M5116 Intervertebral disc disorders with radiculopathy, lumbar region: Secondary | ICD-10-CM

## 2017-08-08 NOTE — Telephone Encounter (Signed)
Patient called requesting medication refill on  acetaminophen-codeine (TYLENOL #3) 300-30 MG tablet Pt states he has not gotten an appt with pain management just yet. Please f/up

## 2017-08-09 MED ORDER — ACETAMINOPHEN-CODEINE #3 300-30 MG PO TABS: 1.0000 | ORAL_TABLET | Freq: Two times a day (BID) | ORAL | 1 refills | Status: DC | PRN

## 2017-08-09 NOTE — Telephone Encounter (Signed)
Refilled

## 2017-08-15 MED FILL — ?PANTOPRAZOLE SOD DR 40MG: 40 MG | 30 days supply | Qty: 30 | Fill #1

## 2017-08-17 ENCOUNTER — Encounter: Payer: Medicaid Other | Admitting: Podiatry

## 2017-08-22 NOTE — Progress Notes (Signed)
This encounter was created in error - please disregard.

## 2017-08-24 ENCOUNTER — Other Ambulatory Visit: Payer: Self-pay | Admitting: Family Medicine

## 2017-09-06 ENCOUNTER — Encounter: Payer: Self-pay | Admitting: Pharmacist

## 2017-09-06 NOTE — Progress Notes (Signed)
Prior authorization completed and approved for Tylenol #3. Approval #[id number] x 180 days. After 180 days, we will need to submit progress notes and justification for continued use of medication to [facility] Medicaid.

## 2017-09-09 ENCOUNTER — Ambulatory Visit: Payer: BC Managed Care – PPO | Admitting: Family Medicine

## 2017-09-16 ENCOUNTER — Ambulatory Visit: Payer: Medicaid Other | Admitting: Family Medicine

## 2017-09-23 ENCOUNTER — Ambulatory Visit: Payer: Medicaid Other | Admitting: Internal Medicine

## 2017-10-06 ENCOUNTER — Other Ambulatory Visit: Payer: Self-pay | Admitting: Family Medicine

## 2017-10-06 DIAGNOSIS — I10 Essential (primary) hypertension: Secondary | ICD-10-CM

## 2017-10-06 DIAGNOSIS — F331 Major depressive disorder, recurrent, moderate: Secondary | ICD-10-CM

## 2017-10-06 MED FILL — SPIRIVA 18 MCG CP-HANDIHALE: 18 | 30 days supply | Qty: 30 | Fill #2

## 2017-10-06 MED FILL — VALSARTAN-HCTZ 320-25 MG TA: 320-25 | 30 days supply | Qty: 30 | Fill #1

## 2017-10-06 MED FILL — CLOPIDOGREL 75 MG TABLET: 75 | 30 days supply | Qty: 30 | Fill #2

## 2017-10-06 MED FILL — AMLODIPINE BESYLATE 10 MG T: 10 | 30 days supply | Qty: 30 | Fill #0

## 2017-10-06 MED FILL — hydrALAZINE HCL 25 MG TABS: 25 | 30 days supply | Qty: 60 | Fill #3

## 2017-10-06 MED FILL — HYDROCORT-PRAMOXINE 2.5-1%: 2.5-1 | 10 days supply | Qty: 30 | Fill #0

## 2017-10-06 MED FILL — metFORMIN HCL 1000 MG TABS: 1000 | 30 days supply | Qty: 60 | Fill #3

## 2017-10-06 MED FILL — ISOSORBIDE MN ER 30 MG TAB: 30 | 30 days supply | Qty: 120 | Fill #1

## 2017-10-06 MED FILL — traZODone HCL 50 MG TABS: 50 | 30 days supply | Qty: 30 | Fill #1

## 2017-10-06 MED FILL — ?PANTOPRAZOLE SOD DR 40MG: 40 MG | 30 days supply | Qty: 30 | Fill #2

## 2017-10-06 MED FILL — FLUoxetine HCL 20 MG CAPS: 20 | 30 days supply | Qty: 30 | Fill #0

## 2017-10-10 ENCOUNTER — Ambulatory Visit: Payer: Medicaid Other | Attending: Family Medicine | Admitting: Family Medicine

## 2017-10-10 ENCOUNTER — Encounter: Payer: Self-pay | Admitting: Family Medicine

## 2017-10-10 VITALS — BP 151/100 | HR 81 | Temp 98.0°F | Ht 69.0 in | Wt 248.6 lb

## 2017-10-10 DIAGNOSIS — G4730 Sleep apnea, unspecified: Secondary | ICD-10-CM | POA: Diagnosis not present

## 2017-10-10 DIAGNOSIS — Z7902 Long term (current) use of antithrombotics/antiplatelets: Secondary | ICD-10-CM | POA: Insufficient documentation

## 2017-10-10 DIAGNOSIS — N401 Enlarged prostate with lower urinary tract symptoms: Secondary | ICD-10-CM

## 2017-10-10 DIAGNOSIS — Z955 Presence of coronary angioplasty implant and graft: Secondary | ICD-10-CM | POA: Diagnosis not present

## 2017-10-10 DIAGNOSIS — E119 Type 2 diabetes mellitus without complications: Secondary | ICD-10-CM | POA: Diagnosis not present

## 2017-10-10 DIAGNOSIS — Z79891 Long term (current) use of opiate analgesic: Secondary | ICD-10-CM | POA: Insufficient documentation

## 2017-10-10 DIAGNOSIS — G4700 Insomnia, unspecified: Secondary | ICD-10-CM | POA: Diagnosis not present

## 2017-10-10 DIAGNOSIS — Z888 Allergy status to other drugs, medicaments and biological substances status: Secondary | ICD-10-CM | POA: Insufficient documentation

## 2017-10-10 DIAGNOSIS — I25119 Atherosclerotic heart disease of native coronary artery with unspecified angina pectoris: Secondary | ICD-10-CM | POA: Diagnosis not present

## 2017-10-10 DIAGNOSIS — G4709 Other insomnia: Secondary | ICD-10-CM | POA: Diagnosis not present

## 2017-10-10 DIAGNOSIS — R3916 Straining to void: Secondary | ICD-10-CM | POA: Diagnosis not present

## 2017-10-10 DIAGNOSIS — I10 Essential (primary) hypertension: Secondary | ICD-10-CM | POA: Insufficient documentation

## 2017-10-10 DIAGNOSIS — E785 Hyperlipidemia, unspecified: Secondary | ICD-10-CM | POA: Insufficient documentation

## 2017-10-10 DIAGNOSIS — G8929 Other chronic pain: Secondary | ICD-10-CM | POA: Diagnosis not present

## 2017-10-10 DIAGNOSIS — F331 Major depressive disorder, recurrent, moderate: Secondary | ICD-10-CM | POA: Diagnosis not present

## 2017-10-10 DIAGNOSIS — R300 Dysuria: Secondary | ICD-10-CM | POA: Diagnosis not present

## 2017-10-10 DIAGNOSIS — Z9889 Other specified postprocedural states: Secondary | ICD-10-CM | POA: Diagnosis not present

## 2017-10-10 DIAGNOSIS — J449 Chronic obstructive pulmonary disease, unspecified: Secondary | ICD-10-CM | POA: Insufficient documentation

## 2017-10-10 DIAGNOSIS — Z79899 Other long term (current) drug therapy: Secondary | ICD-10-CM | POA: Insufficient documentation

## 2017-10-10 DIAGNOSIS — F419 Anxiety disorder, unspecified: Secondary | ICD-10-CM | POA: Diagnosis not present

## 2017-10-10 DIAGNOSIS — R1031 Right lower quadrant pain: Secondary | ICD-10-CM | POA: Insufficient documentation

## 2017-10-10 DIAGNOSIS — M5116 Intervertebral disc disorders with radiculopathy, lumbar region: Secondary | ICD-10-CM | POA: Insufficient documentation

## 2017-10-10 DIAGNOSIS — M549 Dorsalgia, unspecified: Secondary | ICD-10-CM | POA: Insufficient documentation

## 2017-10-10 DIAGNOSIS — K219 Gastro-esophageal reflux disease without esophagitis: Secondary | ICD-10-CM | POA: Insufficient documentation

## 2017-10-10 DIAGNOSIS — E0800 Diabetes mellitus due to underlying condition with hyperosmolarity without nonketotic hyperglycemic-hyperosmolar coma (NKHHC): Secondary | ICD-10-CM | POA: Diagnosis not present

## 2017-10-10 DIAGNOSIS — E870 Hyperosmolality and hypernatremia: Secondary | ICD-10-CM | POA: Insufficient documentation

## 2017-10-10 DIAGNOSIS — Z7984 Long term (current) use of oral hypoglycemic drugs: Secondary | ICD-10-CM | POA: Insufficient documentation

## 2017-10-10 LAB — POCT URINALYSIS DIPSTICK
Blood, UA: NEGATIVE
GLUCOSE UA: NEGATIVE
KETONES UA: NEGATIVE
Leukocytes, UA: NEGATIVE
Nitrite, UA: NEGATIVE
PROTEIN UA: 30
Urobilinogen, UA: 1 E.U./dL
pH, UA: 5.5 (ref 5.0–8.0)

## 2017-10-10 LAB — GLUCOSE, POCT (MANUAL RESULT ENTRY): POC Glucose: 91 mg/dl (ref 70–99)

## 2017-10-10 MED ORDER — CETIRIZINE HCL 10 MG PO TABS: 10.0000 mg | ORAL_TABLET | Freq: Every day | ORAL | 1 refills | Status: DC

## 2017-10-10 MED ORDER — ATORVASTATIN CALCIUM 40 MG PO TABS: 40.0000 mg | ORAL_TABLET | Freq: Every day | ORAL | 1 refills | Status: DC

## 2017-10-10 MED ORDER — ISOSORBIDE MONONITRATE ER 120 MG PO TB24: 120.0000 mg | ORAL_TABLET | Freq: Every day | ORAL | 5 refills | Status: DC

## 2017-10-10 MED ORDER — MOMETASONE FUROATE 220 MCG/INH IN AEPB: 2.0000 | INHALATION_SPRAY | Freq: Every day | RESPIRATORY_TRACT | 1 refills | Status: DC

## 2017-10-10 MED ORDER — VALSARTAN-HYDROCHLOROTHIAZIDE 320-25 MG PO TABS
1.0000 | ORAL_TABLET | Freq: Every day | ORAL | 5 refills | Status: DC
Start: 2017-10-10 — End: 2017-11-30

## 2017-10-10 MED ORDER — ASPIRIN 81 MG PO TABS: 81.0000 mg | ORAL_TABLET | Freq: Every day | ORAL | Status: DC

## 2017-10-10 MED ORDER — TRAZODONE HCL 50 MG PO TABS: 50.0000 mg | ORAL_TABLET | Freq: Every evening | ORAL | 5 refills | Status: DC | PRN

## 2017-10-10 MED ORDER — TIOTROPIUM BROMIDE MONOHYDRATE 18 MCG IN CAPS: 18.0000 ug | ORAL_CAPSULE | Freq: Every day | RESPIRATORY_TRACT | 5 refills | Status: DC

## 2017-10-10 MED ORDER — FLUOXETINE HCL 20 MG PO CAPS: ORAL_CAPSULE | ORAL | 5 refills | Status: DC

## 2017-10-10 MED ORDER — ACETAMINOPHEN-CODEINE #3 300-30 MG PO TABS: 1.0000 | ORAL_TABLET | Freq: Two times a day (BID) | ORAL | 1 refills | Status: DC | PRN

## 2017-10-10 MED ORDER — PANTOPRAZOLE SODIUM 40 MG PO TBEC: 40.0000 mg | DELAYED_RELEASE_TABLET | Freq: Every day | ORAL | 5 refills | Status: DC

## 2017-10-10 MED ORDER — TAMSULOSIN HCL 0.4 MG PO CAPS: 0.4000 mg | ORAL_CAPSULE | Freq: Every day | ORAL | 5 refills | Status: DC

## 2017-10-10 MED ORDER — METFORMIN HCL 1000 MG PO TABS: 1000.0000 mg | ORAL_TABLET | Freq: Two times a day (BID) | ORAL | 1 refills | Status: DC

## 2017-10-10 MED ORDER — CLOPIDOGREL BISULFATE 75 MG PO TABS: 75.0000 mg | ORAL_TABLET | Freq: Every day | ORAL | 1 refills | Status: DC

## 2017-10-10 MED ORDER — AMLODIPINE BESYLATE 10 MG PO TABS: 10.0000 mg | ORAL_TABLET | Freq: Every day | ORAL | 6 refills | Status: DC

## 2017-10-10 NOTE — Patient Instructions (Addendum)
Benign Prostatic Hyperplasia Benign prostatic hyperplasia is when the prostate gland is bigger than normal (enlarged). The prostate is a gland that produces the fluid that goes into semen. It is near the opening to the bladder and it surrounds the tube that drains urine out of the body (urethra). Benign prostatic hyperplasia is common among older men and it typically causes problems with urinating. The prostate grows slowly as you age. As the prostate grows, it can pinch the urethra. This causes the bladder to work too hard to pass urine, which leads to a thickened bladder wall. The bladder may eventually become weak and unable to empty completely. What are the causes? The exact cause of this condition is not known. It may be related to changes in hormones as the body ages. What increases the risk? You are more likely to develop this condition if:  You have a family history of the condition.  You are age 31 or older.  You have a history of erectile dysfunction.  You do not exercise.  You have certain medical conditions, including: ? Type 2 diabetes. ? Obesity. ? Heart and circulatory disease.  What are the signs or symptoms? Symptoms of this condition include:  Weak or interrupted urine stream.  Dribbling or leaking urine.  Feeling like the bladder has not emptied completely.  Difficulty starting urination.  Getting up frequently at night to urinate.  Urinating more often (8 or more times a day).  Accidental loss of urine (urinary incontinence).  Pain during urination or ejaculation.  Urine with an unusual smell or color.  The size of the prostate does not always determine the severity of the symptoms. For example, a man with a large prostate may experience minor symptoms, or a man with a smaller prostate may experience a severe blockage. How is this diagnosed? This condition may be diagnosed based on:  Your medical history and symptoms.  A physical exam. This usually  includes a digital rectal exam. During this exam, your health care provider places a gloved, lubricated finger into the rectum to feel the size of the prostate.  A blood test. This test checks for high levels of a protein that is produced by the prostate (prostate specific antigen, PSA).  Tests to examine how well the urethra and bladder are functioning (urodynamic tests).  Cystoscopy. For this test, a small, tube-shaped instrument (cystoscope) is used to look inside the urethra and bladder. The cystoscope is placed into the urinary tract through the opening at the tip of the penis.  Urine tests.  Ultrasound.  How is this treated? Treatment for this condition depends on how severe your symptoms are. Treatment may include:  Active surveillance or "watchful waiting." If your symptoms are mild, your health care provider may delay treatment and ask you to keep track of your symptoms. You will have regular checkups to examine the size of your prostate, discuss symptoms, and determine whether treatment is needed.  Medicines. These may be used to: ? Stop prostate growth. ? Shrink the prostate. ? Relieve symptoms.  Lifestyle changes, including: ? Pelvic floor muscle exercises. The pelvic floor muscles are a group of muscles that relax when you urinate. ? Bladder training. This involves exercises that train the bladder to hold more urine for longer periods. ? Reducing the amount of liquid that you drink. This is especially important before sleeping and before long periods of time spent in public. ? Reducing the amount of caffeine and alcohol that you drink. ? Treating or  preventing constipation.  Surgery to reduce the size of the prostate or widen the urethra. This is typically done if your symptoms are severe or there are serious complications from the enlarged prostate.  Follow these instructions at home: Medicines  Take over-the-counter and prescription medicines as told by your health  care provider.  Avoid certain medicines, such as decongestants, antihistamines, and some prescription medicines as told by your health care provider. Ask your health care provider which medicines you should avoid. General instructions  Monitor your symptoms for any changes. Tell your health care provider about any changes.  Give yourself time when you urinate.  Avoid certain beverages that can irritate the bladder, such as: ? Alcohol. ? Caffeinated drinks like coffee, tea, and cola.  Avoid drinking large amounts of liquid before bed or before going out in public.  Do pelvic floor muscle or bladder training exercises as told by your health care provider.  Keep all follow-up visits as told by your health care provider. This is important. Contact a health care provider if:  Your develop new or worse symptoms.  You have trouble getting or maintaining an erection.  You have a fever.  You have pain or burning during urination.  You have blood in your urine. Get help right away if:  You have severe pain when urinating.  You cannot urinate.  You have severe pain in your abdomen.  You are dizzy.  You faint.  You have severe back pain.  Your urine is dark red and difficult to see through.  You have large blood clots in your urine.  You have severe pain after an erection.  You have chest pain, dizziness, or nausea during sexual activity. Summary  The prostate is a gland that produces the fluid that goes into semen. It is near the opening to the bladder and it surrounds the tube that drains urine out of the body (urethra).  Benign prostatic hyperplasia is common among older men and it typically causes problems with urinating.  If your symptoms are mild, your health care provider may delay treatment and ask you to keep track of your symptoms. You will have regular checkups to examine the size of your prostate, discuss symptoms, and determine whether treatment is  needed.  If directed, you may need to avoid certain medicines, such as decongestants, antihistamines, and some prescription medicines.  Contact your health care provider if you develop new or worse symptoms. This information is not intended to replace advice given to you by your health care provider. Make sure you discuss any questions you have with your health care provider. Document Released: 11/08/2005 Document Revised: 09/27/2016 Document Reviewed: 09/27/2016 Elsevier Interactive Patient Education  2017 [place].  Make sure to pick up contrast on 10/14/17 from [facility].

## 2017-10-11 ENCOUNTER — Telehealth: Payer: Self-pay

## 2017-10-11 ENCOUNTER — Ambulatory Visit: Payer: Medicaid Other | Admitting: Internal Medicine

## 2017-10-11 LAB — CBC WITH DIFFERENTIAL/PLATELET
BASOS: 0 %
Basophils Absolute: 0 10*3/uL (ref 0.0–0.2)
EOS (ABSOLUTE): 0.1 10*3/uL (ref 0.0–0.4)
Eos: 1 %
HEMOGLOBIN: 14.4 g/dL (ref 13.0–17.7)
Hematocrit: 43.3 % (ref 37.5–51.0)
IMMATURE GRANS (ABS): 0 10*3/uL (ref 0.0–0.1)
Immature Granulocytes: 0 %
LYMPHS ABS: 3.2 10*3/uL — AB (ref 0.7–3.1)
LYMPHS: 44 %
MCH: 27.6 pg (ref 26.6–33.0)
MCHC: 33.3 g/dL (ref 31.5–35.7)
MCV: 83 fL (ref 79–97)
MONOCYTES: 8 %
Monocytes Absolute: 0.6 10*3/uL (ref 0.1–0.9)
NEUTROS ABS: 3.4 10*3/uL (ref 1.4–7.0)
Neutrophils: 47 %
Platelets: 274 10*3/uL (ref 150–379)
RBC: 5.22 x10E6/uL (ref 4.14–5.80)
RDW: 13.6 % (ref 12.3–15.4)
WBC: 7.3 10*3/uL (ref 3.4–10.8)

## 2017-10-11 LAB — PSA, TOTAL AND FREE
PSA FREE: 0.21 ng/mL
PSA, Free Pct: 21 %
Prostate Specific Ag, Serum: 1 ng/mL (ref 0.0–4.0)

## 2017-10-11 LAB — CMP14+EGFR
ALT: 13 IU/L (ref 0–44)
AST: 14 IU/L (ref 0–40)
Albumin/Globulin Ratio: 1.6 (ref 1.2–2.2)
Albumin: 4.5 g/dL (ref 3.5–5.5)
Alkaline Phosphatase: 70 IU/L (ref 39–117)
BUN/Creatinine Ratio: 10 (ref 9–20)
BUN: 9 mg/dL (ref 6–24)
Bilirubin Total: 0.2 mg/dL (ref 0.0–1.2)
CHLORIDE: 103 mmol/L (ref 96–106)
CO2: 27 mmol/L (ref 20–29)
Calcium: 9.5 mg/dL (ref 8.7–10.2)
Creatinine, Ser: 0.9 mg/dL (ref 0.76–1.27)
GFR, EST AFRICAN AMERICAN: 114 mL/min/{1.73_m2} (ref >59)
GFR, EST NON AFRICAN AMERICAN: 99 mL/min/{1.73_m2} (ref >59)
GLOBULIN, TOTAL: 2.8 g/dL (ref 1.5–4.5)
Glucose: 72 mg/dL (ref 65–99)
POTASSIUM: 4 mmol/L (ref 3.5–5.2)
SODIUM: 144 mmol/L (ref 134–144)
Total Protein: 7.3 g/dL (ref 6.0–8.5)

## 2017-10-11 NOTE — Telephone Encounter (Signed)
Pt was called and informed of lab results. 

## 2017-10-11 NOTE — Progress Notes (Signed)
Subjective:  Patient ID: Andrew West, male    DOB: 1966/10/30  Age: 51 y.o. MRN: [id number]  CC: Dysuria   HPI Andrew West is a 51 y.o. with a  Medical history of Type 2 DM (A1c 6.0), CAD s/p PCI/stent to LCx and OM1In 09/2015; cutting balloon angioplasty to OM 1, HLD, HTN, previous tobacco abuse, chronic back pain s/p laminectomy/discectomy (3 years ago by Dr Dutch Quint), depression who comes into the clinic for an acute visit complaining of dysuria, abdominal pain for the last 3 days which he describes as severe.  Abdominal pain is severe and constant but is worse on urination and he also complains of hesitancy and sense of incomplete voiding, denies hematuria. He has had, decreased appetite but no fever, no diarrhea or constipation.  He does have chronic low back pain for which he had referred him to pain management team 06/2017 however he is yet to obtain an appointment from them is requesting a refill of Tylenol No. 3.  His diabetes is controlled with an A1c of 6.0 and he denies hypoglycemia or visual concerns. His blood pressure is elevated and he endorses compliance with his antihypertensives.  He has chronic chest pain with his last visit to cardiology in 06/2017 and he has been deemed not to be a suitable candidate for bypass, medical therapy recommended.  Past Medical History:  Diagnosis Date  . Anxiety   . CAD (coronary artery disease)    a. s/p multiple PCIs with last cath 11/2016 with severe multivessel CAD, s/p PCTA to LCx but unable to pass stent  . Chronic leg pain    bilateral  . Chronic lower back pain   . COPD (chronic obstructive pulmonary disease) (HCC)   . Depression   . GERD (gastroesophageal reflux disease)    Takes Dexilant  . HLD (hyperlipidemia)   . Hypertension   . Rhabdomyolysis    h/o, r/t statins  . Sleep apnea    "can't tolerate mask" (12/16/2016)  . Type II diabetes mellitus (HCC)     Past Surgical History:  Procedure Laterality Date  .  BACK SURGERY    . COLONOSCOPY W/ POLYPECTOMY    . CORONARY ANGIOPLASTY  09/25/2015   mid cir & om  . CORONARY ANGIOPLASTY WITH STENT PLACEMENT  10/09/2001   PTCA & stenting of mid AV circumflex; 2.5x93mm Pixel stent  . CORONARY ANGIOPLASTY WITH STENT PLACEMENT  12/13/2001   PCI with stent to mid L circumflex, 95% stenosis to 0% residual  . CORONARY ANGIOPLASTY WITH STENT PLACEMENT  10/10/2003   PCI to mid AV circumflex; LAD 30% disease; RCA 100% occluded prox.  . CORONARY ANGIOPLASTY WITH STENT PLACEMENT  09/01/2011   PCI with stenting with bare metal stent to mid AV groove circumflex and PDA  . CORONARY ANGIOPLASTY WITH STENT PLACEMENT  10/17/2011   cutting balloon angioplasty of ostial lateral OM1 branch and bifurcation AV groove circumflex OM junction; stenosis reduced to 0%  . Coronary Balloon Angioplasty N/A 12/16/2016   Performed by Marykay Lex, MD at [facility] INVASIVE CV LAB  . EXCISIONAL HEMORRHOIDECTOMY    . Left Heart Cath and Coronary Angiography N/A 12/16/2016   Performed by Marykay Lex, MD at [facility] INVASIVE CV LAB  . Left Heart Cath and Coronary Angiography N/A 09/25/2015   Performed by Marykay Lex, MD at [facility] INVASIVE CV LAB  . LEFT HEART CATHETERIZATION WITH CORONARY ANGIOGRAM N/A 10/18/2011   Performed by Bryan Lemma, MD at [facility] CATH  LAB  . LUMBAR LAMINECTOMY/DECOMPRESSION MICRODISCECTOMY 1 LEVEL Left 03/31/2012   Performed by Temple Pacini, MD at [facility] NEURO ORS  . TRANSTHORACIC ECHOCARDIOGRAM  07/28/2011   EF 55-65%; LVH, grade 1 diastolic dysfunction;     Allergies  Allergen Reactions  . Iohexol Anaphylaxis    PT. TO BE PREMEDICATED PRIOR TO IV CONTRAST PER DR Eppie Gibson /MMS//12/15/15Desc: PT BECAME SOB AND CHEST TIGHTNESS AFTER CONTRAST INJECTION.  [name],RT-RCT., Onset Date: [id number]      Outpatient Medications Prior to Visit  Medication Sig Dispense Refill  . acetaminophen (TYLENOL) 500 MG tablet Take 1,000 mg by mouth every 6 (six) hours as needed for  headache.    . Albuterol Sulfate (PROAIR RESPICLICK) 108 (90 Base) MCG/ACT AEPB Inhale 1 puff into the lungs every 6 (six) hours as needed (shortness of breath). 3 each 1  . Blood Glucose Monitoring Suppl (ACCU-CHEK AVIVA PLUS) w/Device KIT Use as dircted 1 kit 0  . fluticasone (FLONASE) 50 MCG/ACT nasal spray Place 2 sprays into both nostrils daily. 16 g 6  . furosemide (LASIX) 20 MG tablet Take 20 mg by mouth 2 (two) times daily.    Marland Kitchen glucose blood (ACCU-CHEK AVIVA) test strip Use as instructed 100 each 12  . hydrocortisone-pramoxine (ANALPRAM-HC) 2.5-1 % rectal cream PLACE 1 APPLICATION RECTALLY 3 (THREE) TIMES DAILY. 30 g 1  . Nebivolol HCl 20 MG TABS Take 1 tablet (20 mg total) by mouth daily. 90 tablet 3  . nitroGLYCERIN (NITROSTAT) 0.4 MG SL tablet Place 1 tablet (0.4 mg total) under the tongue every 5 (five) minutes as needed for chest pain. 25 tablet 2  . oxyCODONE-acetaminophen (PERCOCET/ROXICET) 5-325 MG tablet Take 1-2 tablets by mouth every 4 (four) hours as needed for severe pain. 10 tablet 0  . penicillin v potassium (VEETID) 500 MG tablet Take 1 tablet (500 mg total) by mouth 4 (four) times daily. 40 tablet 0  . pregabalin (LYRICA) 75 MG capsule Take 1 capsule (75 mg total) by mouth 3 (three) times daily. 270 capsule 3  . ranolazine (RANEXA) 1000 MG SR tablet Take 1 tablet (1,000 mg total) by mouth 2 (two) times daily. 180 tablet 3  . acetaminophen-codeine (TYLENOL #3) 300-30 MG tablet Take 1 tablet by mouth every 12 (twelve) hours as needed for moderate pain. 60 tablet 1  . amLODipine (NORVASC) 10 MG tablet TAKE 1 TABLET BY MOUTH DAILY. 30 tablet 0  . aspirin 81 MG tablet Take 1 tablet (81 mg total) by mouth daily.    Marland Kitchen atorvastatin (LIPITOR) 40 MG tablet Take 1 tablet (40 mg total) by mouth daily. 90 tablet 1  . cetirizine (ZYRTEC) 10 MG tablet Take 1 tablet (10 mg total) by mouth daily. 30 tablet 1  . clopidogrel (PLAVIX) 75 MG tablet Take 1 tablet (75 mg total) by mouth daily.  90 tablet 3  . FLUoxetine (PROZAC) 20 MG capsule TAKE 1 TABLET BY MOUTH DAILY. 30 capsule 0  . isosorbide mononitrate (IMDUR) 30 MG 24 hr tablet TAKE 2 TABLETS BY MOUTH 2 TIMES DAILY. 120 tablet 2  . metFORMIN (GLUCOPHAGE) 1000 MG tablet Take 1 tablet (1,000 mg total) by mouth 2 (two) times daily with a meal. 180 tablet 1  . mometasone (ASMANEX) 220 MCG/INH inhaler Inhale 2 puffs into the lungs daily. 3 Inhaler 1  . pantoprazole (PROTONIX) 40 MG tablet Take 1 tablet (40 mg total) by mouth daily. 90 tablet 1  . tiotropium (SPIRIVA HANDIHALER) 18 MCG inhalation capsule Place 1 capsule (18  mcg total) into inhaler and inhale daily. 30 capsule 3  . traZODone (DESYREL) 50 MG tablet TAKE 1 TABLET BY MOUTH AT BEDTIME AS NEEDED FOR SLEEP. 30 tablet 2  . valsartan-hydrochlorothiazide (DIOVAN-HCT) 320-25 MG tablet TAKE 1 TABLET BY MOUTH DAILY. 90 tablet 0  . Blood Pressure Monitoring (BLOOD PRESSURE CUFF) MISC 1 Units by Does not apply route once. 1 each 0   No facility-administered medications prior to visit.     ROS Review of Systems  Constitutional: Positive for appetite change. Negative for activity change and fever.  HENT: Negative for sinus pressure and sore throat.   Eyes: Negative for visual disturbance.  Respiratory: Negative for cough, chest tightness and shortness of breath.   Cardiovascular: Positive for chest pain. Negative for leg swelling.  Gastrointestinal: Positive for abdominal pain and nausea. Negative for abdominal distention, constipation and diarrhea.  Endocrine: Negative.   Genitourinary: Positive for dysuria.  Musculoskeletal: Positive for back pain. Negative for joint swelling and myalgias.  Skin: Negative for rash.  Allergic/Immunologic: Negative.   Neurological: Negative for weakness, light-headedness and numbness.  Psychiatric/Behavioral: Negative for dysphoric mood and suicidal ideas.    Objective:  BP (!) 151/100   Pulse 81   Temp 98 F (36.7 C) (Oral)   Ht 5'  9" (1.753 m)   Wt 248 lb 9.6 oz (112.8 kg)   SpO2 98%   BMI 36.71 kg/m   BP/Weight 10/10/2017 07/12/2017 06/08/2017  Systolic BP 151 130 142  Diastolic BP 100 84 85  Wt. (Lbs) 248.6 250.8 252.8  BMI 36.71 37.04 38.44      Physical Exam  Constitutional: He is oriented to person, place, and time. He appears well-developed and well-nourished.  Cardiovascular: Normal rate, normal heart sounds and intact distal pulses.  No murmur heard. Pulmonary/Chest: Effort normal and breath sounds normal. He has no wheezes. He has no rales. He exhibits no tenderness.  Abdominal: Soft. Bowel sounds are normal. He exhibits no distension and no mass. There is tenderness (RLQ TTP). There is guarding.  Genitourinary: Rectum normal.  Genitourinary Comments: Enlarged prostrate  Musculoskeletal: Normal range of motion.  Neurological: He is alert and oriented to person, place, and time.  Skin: Skin is warm and dry.  Psychiatric: He has a normal mood and affect.    Lab Results  Component Value Date   HGBA1C 6.0 06/08/2017     Assessment & Plan:   1. CAD /Presence of stent in left circumflex coronary artery Ongoing angina-not a candidate for bypass per cardiology Medical therapy has been maximized -continue Plavix, aspirin, Ranexa, statin and antihypertensives Follow-up with cardiology - isosorbide mononitrate (IMDUR) 120 MG 24 hr tablet; Take 1 tablet (120 mg total) daily by mouth.  Dispense: 30 tablet; Refill: 5 - atorvastatin (LIPITOR) 40 MG tablet; Take 1 tablet (40 mg total) daily by mouth.  Dispense: 90 tablet; Refill: 1 - aspirin 81 MG tablet; Take 1 tablet (81 mg total) daily by mouth.  Dispense: 30 tablet - clopidogrel (PLAVIX) 75 MG tablet; Take 1 tablet (75 mg total) daily by mouth.  Dispense: 90 tablet; Refill: 1  2. Essential hypertension Uncontrolled Increase isosorbide dose Low sodium, DASH diet - amLODipine (NORVASC) 10 MG tablet; Take 1 tablet (10 mg total) daily by mouth.   Dispense: 30 tablet; Refill: 6 - valsartan-hydrochlorothiazide (DIOVAN-HCT) 320-25 MG tablet; Take 1 tablet daily by mouth.  Dispense: 30 tablet; Refill: 5 - CMP14+EGFR  3. Moderate episode of recurrent major depressive disorder (HCC) Controlled - FLUoxetine (PROZAC) 20  MG capsule; TAKE 1 TABLET BY MOUTH DAILY.  Dispense: 30 capsule; Refill: 5  4. Diabetes mellitus due to underlying condition with hyperosmolarity without coma, without long-term current use of insulin (HCC) Controlled with A1c of 6.0 Diabetic diet - metFORMIN (GLUCOPHAGE) 1000 MG tablet; Take 1 tablet (1,000 mg total) 2 (two) times daily with a meal by mouth.  Dispense: 180 tablet; Refill: 1 - POCT glucose (manual entry)  5. COPD with asthma (HCC) No acute exacerbation - mometasone (ASMANEX) 220 MCG/INH inhaler; Inhale 2 puffs daily into the lungs.  Dispense: 3 Inhaler; Refill: 1  6. Gastroesophageal reflux disease without esophagitis Controlled - pantoprazole (PROTONIX) 40 MG tablet; Take 1 tablet (40 mg total) daily by mouth.  Dispense: 30 tablet; Refill: 5  7. Other insomnia Controlled - traZODone (DESYREL) 50 MG tablet; Take 1 tablet (50 mg total) at bedtime as needed by mouth. for sleep  Dispense: 30 tablet; Refill: 5  8. Benign prostatic hyperplasia (BPH) with straining on urination He possibly has some form of urinary retention however right lower quadrant pain is out of proportion to that expected - tamsulosin (FLOMAX) 0.4 MG CAPS capsule; Take 1 capsule (0.4 mg total) daily by mouth.  Dispense: 30 capsule; Refill: 5 - PSA, total and free - CBC with Differential/Platelet  9. Right lower quadrant abdominal pain Severe pain, will need to exclude appendicitis - CT Abdomen Pelvis W Contrast; Future - POCT urinalysis dipstick  10. Lumbar disc herniation with radiculopathy He is to hear from pain management-I referred him in 06/2017 Will send a message to the referral coordinator - acetaminophen-codeine  (TYLENOL #3) 300-30 MG tablet; Take 1 tablet every 12 (twelve) hours as needed by mouth for moderate pain.  Dispense: 60 tablet; Refill: 1   Meds ordered this encounter  Medications  . isosorbide mononitrate (IMDUR) 120 MG 24 hr tablet    Sig: Take 1 tablet (120 mg total) daily by mouth.    Dispense:  30 tablet    Refill:  5    Discontinue previous dose  . amLODipine (NORVASC) 10 MG tablet    Sig: Take 1 tablet (10 mg total) daily by mouth.    Dispense:  30 tablet    Refill:  6  . atorvastatin (LIPITOR) 40 MG tablet    Sig: Take 1 tablet (40 mg total) daily by mouth.    Dispense:  90 tablet    Refill:  1    Discontinue simvastatin  . aspirin 81 MG tablet    Sig: Take 1 tablet (81 mg total) daily by mouth.    Dispense:  30 tablet  . cetirizine (ZYRTEC) 10 MG tablet    Sig: Take 1 tablet (10 mg total) daily by mouth.    Dispense:  30 tablet    Refill:  1  . clopidogrel (PLAVIX) 75 MG tablet    Sig: Take 1 tablet (75 mg total) daily by mouth.    Dispense:  90 tablet    Refill:  1  . FLUoxetine (PROZAC) 20 MG capsule    Sig: TAKE 1 TABLET BY MOUTH DAILY.    Dispense:  30 capsule    Refill:  5  . metFORMIN (GLUCOPHAGE) 1000 MG tablet    Sig: Take 1 tablet (1,000 mg total) 2 (two) times daily with a meal by mouth.    Dispense:  180 tablet    Refill:  1  . mometasone (ASMANEX) 220 MCG/INH inhaler    Sig: Inhale 2 puffs daily into the lungs.  Dispense:  3 Inhaler    Refill:  1  . pantoprazole (PROTONIX) 40 MG tablet    Sig: Take 1 tablet (40 mg total) daily by mouth.    Dispense:  30 tablet    Refill:  5  . tiotropium (SPIRIVA HANDIHALER) 18 MCG inhalation capsule    Sig: Place 1 capsule (18 mcg total) daily into inhaler and inhale.    Dispense:  30 capsule    Refill:  5  . traZODone (DESYREL) 50 MG tablet    Sig: Take 1 tablet (50 mg total) at bedtime as needed by mouth. for sleep    Dispense:  30 tablet    Refill:  5  . valsartan-hydrochlorothiazide (DIOVAN-HCT)  320-25 MG tablet    Sig: Take 1 tablet daily by mouth.    Dispense:  30 tablet    Refill:  5  . tamsulosin (FLOMAX) 0.4 MG CAPS capsule    Sig: Take 1 capsule (0.4 mg total) daily by mouth.    Dispense:  30 capsule    Refill:  5  . acetaminophen-codeine (TYLENOL #3) 300-30 MG tablet    Sig: Take 1 tablet every 12 (twelve) hours as needed by mouth for moderate pain.    Dispense:  60 tablet    Refill:  1    Follow-up: Return in about 1 month (around 11/09/2017) for follow up BPH.   Jaclyn Shaggy MD

## 2017-10-12 ENCOUNTER — Ambulatory Visit: Payer: Medicaid Other | Admitting: Family Medicine

## 2017-10-17 ENCOUNTER — Ambulatory Visit (HOSPITAL_COMMUNITY): Admission: RE | Admit: 2017-10-17 | Payer: Medicaid Other | Source: Ambulatory Visit

## 2017-10-17 ENCOUNTER — Encounter: Payer: Self-pay | Admitting: Internal Medicine

## 2017-10-17 ENCOUNTER — Ambulatory Visit (INDEPENDENT_AMBULATORY_CARE_PROVIDER_SITE_OTHER): Payer: Medicaid Other | Admitting: Internal Medicine

## 2017-10-17 VITALS — BP 134/96 | HR 75 | Ht 69.0 in | Wt 249.0 lb

## 2017-10-17 DIAGNOSIS — R079 Chest pain, unspecified: Secondary | ICD-10-CM | POA: Diagnosis not present

## 2017-10-17 DIAGNOSIS — I208 Other forms of angina pectoris: Secondary | ICD-10-CM | POA: Diagnosis not present

## 2017-10-17 DIAGNOSIS — I202 Refractory angina pectoris: Secondary | ICD-10-CM

## 2017-10-17 DIAGNOSIS — I2510 Atherosclerotic heart disease of native coronary artery without angina pectoris: Secondary | ICD-10-CM | POA: Diagnosis not present

## 2017-10-17 DIAGNOSIS — Z9861 Coronary angioplasty status: Secondary | ICD-10-CM | POA: Diagnosis not present

## 2017-10-17 NOTE — Patient Instructions (Signed)
Dr. Debara Pickett has recommended EECP - enhanced external counterpulsation. Information on this has been provided. We will contact you about a referral for this treatment.   Your physician recommends that you schedule a follow-up appointment in: [place] with Dr. Debara Pickett  EECP - Enhanced External Counterpulsation  There are a large, increasing number of patients who have persistent anginal symptoms, who have exhausted the standard treatments for revascularization and remain severely restricted. Enhanced External Counterpulsation (EECP) may stimulate the openings or formation of collaterals (small branches of blood vessels) to create a natural bypass around narrowed or blocked arteries.  Who is a candidate for EECP? You may be a candidate if you: .Have chronic stable angina .Are not receiving adequate relief from angina by taking nitrates .Do not qualify as a candidate for invasive procedures (bypass surgery, angioplasty, or stenting)  What happens during EECP treatment? EECP is a non-invasive, outpatient therapy. During treatment: . Patients lie down on a padded table in a treatment room . Three electrodes are applied to the skin of the chest and connected to an electrocardiograph (ECG) machine. The ECG will display the heart's rhythm during treatment. Blood pressure is also monitored. . A set of cuffs is wrapped around the calves, thighs and buttocks. These cuffs attach to air hoses that connect to valves that inflate and deflate the cuffs. Patients experience a sensation of a strong "hug" moving upward from calves to thighs to buttocks during inflation followed by the rapid release of pressure on deflation. Inflation and deflation are electronically synchronized with the heartbeat and blood pressure using the ECG and blood pressure monitors.  How does EECP work? Marland Kitchen The EECP treatment gently but firmly compresses the blood vessels in the lower limbs to increase blood flow to your heart. Each wave of  pressure is electronically timed to the heartbeat, so that the increased blood flow is delivered to your heart at the precise moment it is relaxing. When the heart pumps again, pressure is released instantaneously. This lowers resistance in the blood vessels in the legs so that blood may be pumped more easily from your heart. Marland Kitchen EECP may encourage blood vessels to open small channels that become extra branches. These channels or collaterals may eventually become "natural bypass" vessels to provide blood flow to heart muscle. This contributes to the relief of angina symptoms.  How often are patients treated? Patients who are accepted for treatment must undergo 35 hours of EECP therapy. Treatment is administered 1-2 hours a day, five days a week, for 7 weeks. Published studies conducted at numerous medical centers have demonstrated benefits for most patients including: .Less need for anti-anginal medication .Decrease in symptoms of angina .Increased ability to do activities without onset of symptoms .Ability to return to enjoyable activities

## 2017-10-17 NOTE — Progress Notes (Signed)
OFFICE NOTE  Chief Complaint:  Chest pain  Primary Care Physician: Arnoldo Morale, MD  HPI:  Andrew West  is a 51 year old gentleman with a history of coronary artery disease and stent placement to the circumflex and obtuse marginal and a history of in stent restenosis to the LAD. There is also a nondominant right coronary artery that is 100% occluded, filled with collaterals. He did have a stress test recently, which showed an EF of 53% and reversible septal ischemia in the setting of chest pain and after much convincing underwent cardiac catheterization.  He then underwent cutting balloon angioplasty to an ostial lateral OM1 branch and the bifurcation AV groove circumflex OM junction. This was successful at reducing the stenosis to 0%. This was in November 2012 and he did not return for followup appointment until April 2013. He was suffering from low back pain and has been evaluated and treated by Dr. Trenton Gammon with a laminectomy/discectomy, which he says was not helpful. Otherwise, he continues to smoke and occasionally complains of shortness of breath and some chest pain symptoms which are atypical. He is on long-acting and/or has not needed to take short-acting nitroglycerin. His other concern today is that he is having problems with his teeth and was recommended to have edentulation and by an oral surgeon Dr. Diona Browner. Finally, he is suffering from significant stress and depression due to recent separation with wife in dealing with his kids. This seems to be a big tablets on his continued smoking.  Andrew West returns today in the office. He feels fairly well. He denies any chest pain. He continues to have problems with low back pain and numbness and tingling in his legs. He is apparently status post laminectomy and microdiscectomy by Dr. Trenton Gammon and continues to have symptoms which may be related to that. He's also complaining of what sounds like neuropathic pain. He is not currently  on medication. He is asking for Tylenol 3 for pain today which I told him I did not prescribe. Ultimately there are no further surgical or nonsurgical options, he may need to go on a neuropathic pain medication or perhaps be referred to a pain management specialist. He is also looking for a new primary care provider as his primary care provider is close to retirement. He reports he has cut back his smoking to about 1 pack every 2 weeks. He is also been out of amlodipine and simvastatin although his blood pressure is well controlled.  I saw Andrew West in the office today. He is reporting now complains of shortness of breath and chest pain. He also feels like his breathing is worse when working around chemicals at work. He uses a respiratory protection mask but also feels like he is under significant stress. He's had missed several days of work and is concerned about his job. Sounds like he is in a difficult work environment. He says that he is apologizing for "deceiving me" that he was not honest about his symptoms. He apparently has been having shortness of breath and chest pain for several months and did not relay that to me.  Andrew West returns to the office today for follow-up. His main complaint is shortness of breath. He's not feeling as much chest pain he's had been previously. We recently did a nuclear stress test which was negative for ischemia and showed an EF of 56%. With regards to shortness of breath he's concerned most of this was related to chemicals at work although he  has not worked in several months. He's had about 20 pound weight gain since we last saw him in the office and denies any lower extremity swelling, orthopnea or PND. Shortness of breath is persistent and is associated with some wheezing. He did see Dr. Lake Bells in pulmonary, who felt that he does have COPD with some centrilobular emphysema which was noted on CT scan and he has been placed on inhalers with minimal benefit  subjectively. Shortness of breath seems to be a little bit worse with exertion which makes me wonder whether there is an element of exercise-induced pulmonary hypertension. He's not had an echocardiogram in some time.  Andrew West returns today for follow-up. He was noted to have some diastolic dysfunction on his echocardiogram. I started him on low-dose Lasix and check lab work including a BNP which is very low around 50. On follow-up today he reports he feels no different with the addition of Lasix. I therefore asked him to take it as needed. He is also describing worsening substernal chest discomfort and a squeezing pressure in his chest today. He says this is been getting worse over the past several days, more than his typical chest discomfort. As previously noted he recently underwent a nuclear stress test a few months ago which was negative for ischemia.   Andrew West returns for follow-up. As mentioned he had had recent progressive chest pain symptoms and worsening shortness of breath. He underwent another cardiac catheterization which demonstrated the following:   Ost 2nd Mrg to 2nd Mrg lesion, 99% stenosed. Post intervention, 99% residual stenosis remained. The lesion was previously treated with a bare metal stentgreater than two years ago.  Mid Cx-2 lesion, 90% stenosed. Post intervention, there is a 20% residual stenosis.  Mid Cx-1 lesion, 80% stenosed. Post intervention, there is a 50% residual stenosis. The lesion was previously treated with a bare metal stent.  Ramus lesion, 100% stenosed. The lesion was previously treated with a bare metal stentgreater than two years ago.  Prox RCA lesion, 100% stenosed.  There is mild left ventricular systolic dysfunction.  Moderately elevated LVEDP of 20 mmHg   Difficult situation with what amounts to be a totally occluded (In-stent re-stenosis) of the OM2 branch with sub-optimal PTCA of the mid AV-Groove In-stent restenosis. He continues to  have chest pain although reports it somewhat improved. He was started on ranolazine and currently is on 1000 mg twice a day.  Andrew West returns today for follow-up. He reports he is doing fairly well on medical therapy. He still gets some sharp chest pain mostly along the right sternal border. He does get short of breath with moderate exertion. He's trying to do some walking on a treadmill but generally stops the exercise once he gets short of breath because he is very nervous. He is not currently working due to combined cardiac pulmonary and musculoskeletal diseases. He says that he and his wife are going to go on a delayed honeymoon since they never took one.  04/20/2016  Andrew West returns today for follow-up. He was recently seen in the hospital in the beginning of May for chest pain. He reports that it's up in the left neck base around the area of the left clavicle. It's very exquisitely tender to touch and feels sharp and electric. This pain is related we think to cervical pain. He ruled out for MI and was not worked up further for coronary ischemia. This pain is felt to be distinctly different. His isosorbide was increased  up to 60 mg twice a day but he notes no change in his symptoms with that. His primary care providers hesitant to provide any pain medication.  06/28/2016  Andrew West was seen back today in follow-up. He recently was in the ER at [facility] the left without being seen after 3 hours. An EKG was performed and showed no ischemic changes. He reported going home and taking some nitroglycerin and aspirin with some eventual improvement in his symptoms. He continues to have a sharp left chest discomfort which is tender to the touch and is persistent. It's not necessarily worse with exertion or relieved by rest. I felt this may likely be a neuropathic pain and I recommended starting gabapentin 300 mg daily at bedtime. He reports no improvement with this medication. I advised him to discontinue  today. He's also concerned about poor sleep at night, significant preoccupation with death and anxiety. This is concerning for possible PTSD type symptoms.  08/23/2016  Andrew West returns today for follow-up. I referred him to behavioral health and he says that he went but it was not helpful. The medical record however does not show any evidence that he made that appointment. He did present to the emergency department on 06/29/2016 with depression and anxiety and was felt not to need inpatient treatment. He underwent nuclear stress testing which was negative for ischemia and showed normal LV function. I do not believe is ongoing chest pain at this time is due to ischemia. Blood pressure is elevated today however 142/102. A recheck was 138/89.  11/03/2016  I saw Andrew West today for follow-up again. He is complaining of persistent chest pain which is chronic, tends to be present for most of the day at rest and with exertion. He also complains of shortness of breath. Surprisingly blood pressure is elevated today, much higher than it has been in the past at 150/105. He reports compliance with his medications. This could be contributing to his problems. He is interested in another heart catheterization. I did remind him that we performed a stress test only 3 months ago which was negative for ischemia. His last coronary intervention was about a year ago which she had some percutaneous angioplasty but no new stent placement. He has complex anatomy that was not amenable to PCI, rather medical therapy was recommended however he has been on maximal doses of nitrates, Ranexa, beta blocker and other blood pressure medications. Interestingly, despite a high dose of metoprolol, his heart rate remains up in the 80s. EKG today is nonischemic.  12/02/2016  Andrew West returns today for follow-up. He reports despite changing his medications which has resulted in improvement in blood pressure that he still has significant  chest discomfort and shortness of breath. He is certain that there is a new blockage in his heart that the cause of this. He believes he needs another heart catheterization. Symptoms seem similar to his symptoms prior to his last cutting balloon angioplasty more than 2 years ago.  01/17/2017  Andrew West seen today in follow-up. He underwent recent repeat coronary catheterization which showed no clear targets for intervention. He did have some small distal disease which was angioplastied and noted a small improvement in his symptoms. His nitroglycerin was increased which I think is also contributing to his improvement. At this point I think we need to continue with medical therapy. We could also possibly consider adding some pain medication, perhaps a neuropathic pain medicine is her may be a complex regional pain syndrome or  other component of recurrent chest pain beyond ischemia.  03/24/2017  Andrew West seen today in follow-up. He continues to have pain in the chest. I don't believe it's anginal. I suspect he might have a complex regional pain syndrome. He is taken gabapentin in the past without much success but does not take it regularly. He may benefit from stronger neuropathic pain medicine such as pregabalin. I discussed with the pharmacist today and we ran an interaction check with his current medications. Potential interaction his Prozac with a slight increased risk of serotonin syndrome. I advised him to monitor that and look for symptoms such as fever or muscle rigidity.  05/24/2017  Andrew West returns today for hospital follow-up. He was again in the hospital for chest pain. Cardiac enzymes were negative. He was seen by one of my partners and recommended follow-up with me. Andrew West reports recurrent chest pain which seems to be nitrate responsive, despite being on high-dose indoor 90 mg every morning and 60 mg every afternoon, pregabalin, Ranexa thousand milligrams twice a day, high-dose  amlodipine, beta blocker, and other treatments for angina, with very well controlled LDL-C less than 60. He is currently on maximal medical therapy. In January he had PCI to the circumflex for in-stent restenosis with a suboptimal result. I reviewed his coronary anatomy today and cath films personally with Dr. Peter Martinique, to see if he was a candidate for chronic total occlusion PCI. His feeling is that there are no real percutaneous options for him. He had at least a diffuse area 50% stenosis of the circumflex with a small distal vessel, close to 50% mid to distal LAD disease and an ostial/proximally occluded RCA with very faint left to right collaterals. Based on these findings, surgical opinion was suggested - although targets are arguably small.   07/12/2017  Andrew West was seen today in follow-up. He recently was seen in the emergency department for tooth and jaw pain. He has significant dental caries and will need global extraction. I've ready provided cardiac clearance for this which apparently can be done on aspirin and Plavix. He's also had back pain which has been chronic. I prefer him to see Dr. Lucianne Lei tried for surgical consultation however it is felt that he's not a candidate for bypass at this time unless he were to develop LAD disease. He has small vessels which are poor targets for grafting. Medical therapy is recommended.  10/17/2017  Andrew West returns today for follow-up.  He reports recurrent chest pain.  He said "I think it is time to go back to the Cath Lab again".  He is already on maximal medical therapy including long-acting nitrates, calcium channel blocker, beta-blocker and ranolazine.  During his last heart catheterization, there were no additional targets for revascularization.  He is not considered a good PCI candidate going forward.  He was referred to CT surgery for possible CABG evaluation and determined to have small vessels which were ungraftable.  We have tried management  of his pain symptoms with neuropathic medications for possible CPRS of the chest without much benefit.  He is recently been having some abdominal pain and a CT scan of the abdomen was ordered.  Because of a possible contrast reaction in the past, he is CT was canceled.  They suggested he will need premedication.  His symptoms seem to be much worse at exertion and relieved by rest.  PMHx:  Past Medical History:  Diagnosis Date  . Anxiety   . CAD (coronary artery  disease)    a. s/p multiple PCIs with last cath 11/2016 with severe multivessel CAD, s/p PCTA to LCx but unable to pass stent  . Chronic leg pain    bilateral  . Chronic lower back pain   . COPD (chronic obstructive pulmonary disease) ([place])   . Depression   . GERD (gastroesophageal reflux disease)    Takes Dexilant  . HLD (hyperlipidemia)   . Hypertension   . Rhabdomyolysis    h/o, r/t statins  . Sleep apnea    "can't tolerate mask" (12/16/2016)  . Type II diabetes mellitus ([place])     Past Surgical History:  Procedure Laterality Date  . BACK SURGERY    . CARDIAC CATHETERIZATION N/A 09/25/2015   Procedure: Left Heart Cath and Coronary Angiography;  Surgeon: Leonie Man, MD;  Location: [place] [facility];  Service: Cardiovascular;  Laterality: N/A;  . CARDIAC CATHETERIZATION N/A 12/16/2016   Procedure: Left Heart Cath and Coronary Angiography;  Surgeon: Leonie Man, MD;  Location: [place] [facility];  Service: Cardiovascular;  Laterality: N/A;  . CARDIAC CATHETERIZATION N/A 12/16/2016   Procedure: Coronary Balloon Angioplasty;  Surgeon: Leonie Man, MD;  Location: [place] [facility];  Service: Cardiovascular;  Laterality: N/A;  . COLONOSCOPY W/ POLYPECTOMY    . CORONARY ANGIOPLASTY  09/25/2015   mid cir & om  . CORONARY ANGIOPLASTY WITH STENT PLACEMENT  10/09/2001   PTCA & stenting of mid AV circumflex; 2.5x66m Pixel stent  . CORONARY ANGIOPLASTY WITH STENT PLACEMENT  12/13/2001   PCI with stent to mid L circumflex,  95% stenosis to 0% residual  . CORONARY ANGIOPLASTY WITH STENT PLACEMENT  10/10/2003   PCI to mid AV circumflex; LAD 30% disease; RCA 100% occluded prox.  . CORONARY ANGIOPLASTY WITH STENT PLACEMENT  09/01/2011   PCI with stenting with bare metal stent to mid AV groove circumflex and PDA  . CORONARY ANGIOPLASTY WITH STENT PLACEMENT  10/17/2011   cutting balloon angioplasty of ostial lateral OM1 branch and bifurcation AV groove circumflex OM junction; stenosis reduced to 0%  . EXCISIONAL HEMORRHOIDECTOMY    . LEFT HEART CATHETERIZATION WITH CORONARY ANGIOGRAM N/A 10/18/2011   Procedure: LEFT HEART CATHETERIZATION WITH CORONARY ANGIOGRAM;  Surgeon: DLeonie Man MD;  [facility];  Service: Cardiovascular;  Laterality: N/A;  . LUMBAR LAMINECTOMY/DECOMPRESSION MICRODISCECTOMY  03/31/2012   Procedure: LUMBAR LAMINECTOMY/DECOMPRESSION MICRODISCECTOMY 1 LEVEL;  Surgeon: HCharlie Pitter MD;  [facility];  Service: Neurosurgery;  Laterality: Left;  . TRANSTHORACIC ECHOCARDIOGRAM  07/28/2011   EF 55-65%; LVH, grade 1 diastolic dysfunction;     FAMHx:  Family History  Problem Relation Age of Onset  . Heart attack Father   . Hypertension Mother   . Diabetes Mother   . Heart disease Brother        x 3   . Heart attack Brother        deceased  . Hypertension Sister   . Diabetes Sister   . Anesthesia problems Neg Hx   . Hypotension Neg Hx   . Malignant hyperthermia Neg Hx   . Pseudochol deficiency Neg Hx     SOCHx:   reports that he quit smoking about 2 years ago. His smoking use included cigarettes. He has a 6.25 pack-year smoking history. he has never used smokeless tobacco. He reports that he does not drink alcohol or use drugs.  ALLERGIES:  Allergies  Allergen Reactions  . Iohexol Anaphylaxis    PT. TO  BE PREMEDICATED PRIOR TO IV CONTRAST PER DR Kris Hartmann /MMS//12/15/15Desc: PT BECAME SOB AND CHEST TIGHTNESS AFTER CONTRAST INJECTION.  [name],RT-RCT., Onset Date:  [id number]     ROS: Pertinent items noted in HPI and remainder of comprehensive ROS otherwise negative.  HOME MEDS: Current Outpatient Medications  Medication Sig Dispense Refill  . acetaminophen-codeine (TYLENOL #3) 300-30 MG tablet Take 1 tablet every 12 (twelve) hours as needed by mouth for moderate pain. 60 tablet 1  . Albuterol Sulfate (PROAIR RESPICLICK) 785 (90 Base) MCG/ACT AEPB Inhale 1 puff into the lungs every 6 (six) hours as needed (shortness of breath). 3 each 1  . amLODipine (NORVASC) 10 MG tablet Take 1 tablet (10 mg total) daily by mouth. 30 tablet 6  . aspirin 81 MG tablet Take 1 tablet (81 mg total) daily by mouth. 30 tablet   . atorvastatin (LIPITOR) 40 MG tablet Take 1 tablet (40 mg total) daily by mouth. 90 tablet 1  . Blood Glucose Monitoring Suppl (ACCU-CHEK AVIVA PLUS) w/Device KIT Use as dircted 1 kit 0  . cetirizine (ZYRTEC) 10 MG tablet Take 1 tablet (10 mg total) daily by mouth. 30 tablet 1  . clopidogrel (PLAVIX) 75 MG tablet Take 1 tablet (75 mg total) daily by mouth. 90 tablet 1  . FLUoxetine (PROZAC) 20 MG capsule TAKE 1 TABLET BY MOUTH DAILY. 30 capsule 5  . fluticasone (FLONASE) 50 MCG/ACT nasal spray Place 2 sprays into both nostrils daily. 16 g 6  . furosemide (LASIX) 20 MG tablet Take 20 mg by mouth 2 (two) times daily.    Marland Kitchen glucose blood (ACCU-CHEK AVIVA) test strip Use as instructed 100 each 12  . hydrocortisone-pramoxine (ANALPRAM-HC) 2.5-1 % rectal cream PLACE 1 APPLICATION RECTALLY 3 (THREE) TIMES DAILY. 30 g 1  . isosorbide mononitrate (IMDUR) 120 MG 24 hr tablet Take 1 tablet (120 mg total) daily by mouth. 30 tablet 5  . metFORMIN (GLUCOPHAGE) 1000 MG tablet Take 1 tablet (1,000 mg total) 2 (two) times daily with a meal by mouth. 180 tablet 1  . mometasone (ASMANEX) 220 MCG/INH inhaler Inhale 2 puffs daily into the lungs. 3 Inhaler 1  . Nebivolol HCl 20 MG TABS Take 1 tablet (20 mg total) by mouth daily. 90 tablet 3  . nitroGLYCERIN (NITROSTAT)  0.4 MG SL tablet Place 1 tablet (0.4 mg total) under the tongue every 5 (five) minutes as needed for chest pain. 25 tablet 2  . oxyCODONE-acetaminophen (PERCOCET/ROXICET) 5-325 MG tablet Take 1-2 tablets by mouth every 4 (four) hours as needed for severe pain. 10 tablet 0  . pantoprazole (PROTONIX) 40 MG tablet Take 1 tablet (40 mg total) daily by mouth. 30 tablet 5  . penicillin v potassium (VEETID) 500 MG tablet Take 1 tablet (500 mg total) by mouth 4 (four) times daily. 40 tablet 0  . pregabalin (LYRICA) 75 MG capsule Take 1 capsule (75 mg total) by mouth 3 (three) times daily. 270 capsule 3  . ranolazine (RANEXA) 1000 MG SR tablet Take 1 tablet (1,000 mg total) by mouth 2 (two) times daily. 180 tablet 3  . tamsulosin (FLOMAX) 0.4 MG CAPS capsule Take 1 capsule (0.4 mg total) daily by mouth. 30 capsule 5  . tiotropium (SPIRIVA HANDIHALER) 18 MCG inhalation capsule Place 1 capsule (18 mcg total) daily into inhaler and inhale. 30 capsule 5  . traZODone (DESYREL) 50 MG tablet Take 1 tablet (50 mg total) at bedtime as needed by mouth. for sleep 30 tablet 5  . valsartan-hydrochlorothiazide (DIOVAN-HCT)  320-25 MG tablet Take 1 tablet daily by mouth. 30 tablet 5  . Blood Pressure Monitoring (BLOOD PRESSURE CUFF) MISC 1 Units by Does not apply route once. 1 each 0   No current facility-administered medications for this visit.     LABS/IMAGING: No results found for this or any previous visit (from the past 48 hour(s)). No results found.  VITALS: BP (!) 134/96   Pulse 75   Ht 5' 9"  (1.753 m)   Wt 249 lb (112.9 kg)   BMI 36.77 kg/m   EXAM: General appearance: alert and no distress Neck: no carotid bruit, no JVD and thyroid not enlarged, symmetric, no tenderness/mass/nodules Lungs: clear to auscultation bilaterally Heart: regular rate and rhythm, S1, S2 normal, no murmur, click, rub or gallop Abdomen: soft, non-tender; bowel sounds normal; no masses,  no organomegaly Extremities: extremities  normal, atraumatic, no cyanosis or edema Pulses: 2+ and symmetric Skin: Skin color, texture, turgor normal. No rashes or lesions Neurologic: Grossly normal Psych: Pleasant  EKG: Normal sinus rhythm at 75-personally reviewed  ASSESSMENT: 1. Recurrent chest pain and dyspnea at rest and with exertion - Occluded OM2 at a prior stent - no good revascularization options - low risk Myoview with no ischemia (07/2016) 2. Coronary artery disease status post PCI and cutting balloon angioplasty (11/2016) 3. Ongoing tobacco abuse 4. Uncontrolled hypertension 5. Dyslipidemia 6. 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 Persistent low back pain 8. Diabetes type 2 9. Morbid obesity 10. Anxiety/?PTSD 11. Acceptable risk for dental extraction  PLAN: 1.   Mr. Fellner continues to have recurrent chest pain and is asking to go back to the Cath Lab.  During his last procedure there were no clear options for PCI.  He has a lot of distal and small vessel disease.  He is not considered a bypass candidate due to small vessels.  Options are quite limited at this point as he is on maximal medical therapy.  One option which is not available locally could be considering EECP.  Although this will require significant commitment on his part, it may improve his symptoms which do sound somewhat anginal and the fact that they are somewhat worse with exertion and relieved by rest.  It does not seem that treatment for neuropathic pain is made a significant difference.  I will plan to refer him locally if possible for EECP since we do not offer that at our institution.  Finally, from a cardiac standpoint he should be okay for contrast dye is needed.  His prior chest pain reaction to contrast dye was not likely allergic, however premedication is reasonable.  Follow-up in 3 months.  Pixie Casino, MD, [facility], [place] Director of the Advanced Lipid Disorders &  Cardiovascular Risk Reduction  Clinic Attending Cardiologist  Direct Dial: [phone]  Fax: [phone]  Website:  [email or web address] 10/17/2017, 2:51 PM

## 2017-10-21 ENCOUNTER — Telehealth: Payer: Self-pay | Admitting: Internal Medicine

## 2017-10-24 NOTE — Telephone Encounter (Signed)
Pixie Casino, MD  Fidel Levy, RN        Discussed with Dr. Salvadore Oxford at [facility] today (10/21/2017) - he will reach out to [name] about offering EECP treatment in [place].   Dr. Debara Pickett

## 2017-10-25 NOTE — Telephone Encounter (Signed)
I gave the contact information to [facility] cardiology - the doctor who does EECP is named Dr. Purcell Nails. He has to wait for them to contact him.  Dr. Lemmie Evens

## 2017-10-25 NOTE — Telephone Encounter (Signed)
Called patient. He states he has not heard anything from this MD about a referral/evaluation for EECP treatment. Informed him I would reach out to Dr. Debara Pickett for contact info to see how to get this process initiated. He voiced understanding.

## 2017-10-28 NOTE — Telephone Encounter (Signed)
Patient aware.

## 2017-11-04 ENCOUNTER — Telehealth: Payer: Self-pay | Admitting: Internal Medicine

## 2017-11-04 NOTE — Telephone Encounter (Signed)
Pt c/o of Chest Pain: 1. Are you having CP right now? Yes  2. Are you experiencing any other symptoms (ex. SOB, nausea, vomiting, sweating)? Shortness of Breath and Light headed  3. How long have you been experiencing CP? Since yesterday  4. Is your CP continuous or coming and going? Continuous  5. Have you taken Nitroglycerin? Yes

## 2017-11-04 NOTE — Telephone Encounter (Signed)
Incoming call from the patient. He stated that he has been having intermittent chest pain that radiates down his left arm since yesterday. At times, his left arm becomes numb. He has taken a nitroglycerin with no relief. He rates his pain 7/10.   He has been advised to go to the ED for further assessment. He verbalized his understanding. Wannetta Sender has been called and notified.

## 2017-11-17 ENCOUNTER — Ambulatory Visit: Payer: Medicaid Other | Attending: Family Medicine | Admitting: Family Medicine

## 2017-11-17 ENCOUNTER — Encounter: Payer: Self-pay | Admitting: Family Medicine

## 2017-11-17 VITALS — BP 145/96 | HR 74 | Temp 98.6°F | Ht 69.0 in | Wt 245.0 lb

## 2017-11-17 DIAGNOSIS — I2510 Atherosclerotic heart disease of native coronary artery without angina pectoris: Secondary | ICD-10-CM | POA: Insufficient documentation

## 2017-11-17 DIAGNOSIS — Z7982 Long term (current) use of aspirin: Secondary | ICD-10-CM | POA: Diagnosis not present

## 2017-11-17 DIAGNOSIS — F419 Anxiety disorder, unspecified: Secondary | ICD-10-CM | POA: Diagnosis not present

## 2017-11-17 DIAGNOSIS — Z7902 Long term (current) use of antithrombotics/antiplatelets: Secondary | ICD-10-CM | POA: Diagnosis not present

## 2017-11-17 DIAGNOSIS — R1030 Lower abdominal pain, unspecified: Secondary | ICD-10-CM | POA: Diagnosis present

## 2017-11-17 DIAGNOSIS — E0800 Diabetes mellitus due to underlying condition with hyperosmolarity without nonketotic hyperglycemic-hyperosmolar coma (NKHHC): Secondary | ICD-10-CM | POA: Diagnosis not present

## 2017-11-17 DIAGNOSIS — R3911 Hesitancy of micturition: Secondary | ICD-10-CM

## 2017-11-17 DIAGNOSIS — Z87891 Personal history of nicotine dependence: Secondary | ICD-10-CM | POA: Diagnosis not present

## 2017-11-17 DIAGNOSIS — Z79899 Other long term (current) drug therapy: Secondary | ICD-10-CM | POA: Insufficient documentation

## 2017-11-17 DIAGNOSIS — E1100 Type 2 diabetes mellitus with hyperosmolarity without nonketotic hyperglycemic-hyperosmolar coma (NKHHC): Secondary | ICD-10-CM | POA: Diagnosis not present

## 2017-11-17 DIAGNOSIS — R1031 Right lower quadrant pain: Secondary | ICD-10-CM | POA: Insufficient documentation

## 2017-11-17 DIAGNOSIS — F329 Major depressive disorder, single episode, unspecified: Secondary | ICD-10-CM | POA: Insufficient documentation

## 2017-11-17 DIAGNOSIS — J449 Chronic obstructive pulmonary disease, unspecified: Secondary | ICD-10-CM | POA: Diagnosis not present

## 2017-11-17 DIAGNOSIS — K219 Gastro-esophageal reflux disease without esophagitis: Secondary | ICD-10-CM | POA: Insufficient documentation

## 2017-11-17 DIAGNOSIS — N401 Enlarged prostate with lower urinary tract symptoms: Secondary | ICD-10-CM | POA: Insufficient documentation

## 2017-11-17 DIAGNOSIS — Z7984 Long term (current) use of oral hypoglycemic drugs: Secondary | ICD-10-CM | POA: Insufficient documentation

## 2017-11-17 DIAGNOSIS — I10 Essential (primary) hypertension: Secondary | ICD-10-CM | POA: Insufficient documentation

## 2017-11-17 LAB — GLUCOSE, POCT (MANUAL RESULT ENTRY): POC GLUCOSE: 101 mg/dL — AB (ref 70–99)

## 2017-11-17 LAB — POCT GLYCOSYLATED HEMOGLOBIN (HGB A1C): HEMOGLOBIN A1C: 5.8

## 2017-11-17 NOTE — Progress Notes (Signed)
 Subjective:  Patient ID: Andrew West, male    DOB: 08/19/1966  Age: 51 y.o. MRN: [id number]  CC: No chief complaint on file.   HPI Andrew West is a 51 y.o. with a  Medical history of Type 2 DM (A1c 5.8), CAD s/p PCI/stent to LCx and OM1 In 09/2015; cutting balloon angioplasty to OM 1, HLD, HTN, previous tobacco abuse, chronic back pain s/p laminectomy/discectomy (3 years ago by Dr Poole), depression who comes into the clinic for follow-up of low abdominal pain.  At his last visit 1 month ago he had presented with acute abdominal pain with tenderness in his right lower quadrant on exam and also had symptoms of urinary retention at the time.  Commenced on Flomax with improvement in symptoms; he was unable to undergo CT of the abdomen and pelvis with contrast which was ordered to rule out diverticulitis given his previous reaction to contrast.  He informs me abdominal pain now is at a 2-3/10 and his urinary symptoms have improved significantly. He has no other acute concerns today.  Past Medical History:  Diagnosis Date  . Anxiety   . CAD (coronary artery disease)    a. s/p multiple PCIs with last cath 11/2016 with severe multivessel CAD, s/p PCTA to LCx but unable to pass stent  . Chronic leg pain    bilateral  . Chronic lower back pain   . COPD (chronic obstructive pulmonary disease) (HCC)   . Depression   . GERD (gastroesophageal reflux disease)    Takes Dexilant  . HLD (hyperlipidemia)   . Hypertension   . Rhabdomyolysis    h/o, r/t statins  . Sleep apnea    "can't tolerate mask" (12/16/2016)  . Type II diabetes mellitus (HCC)     Past Surgical History:  Procedure Laterality Date  . BACK SURGERY    . CARDIAC CATHETERIZATION N/A 09/25/2015   Procedure: Left Heart Cath and Coronary Angiography;  Surgeon: David W Harding, MD;  Location: [facility];  Service: Cardiovascular;  Laterality: N/A;  . CARDIAC CATHETERIZATION N/A 12/16/2016   Procedure: Left Heart Cath  and Coronary Angiography;  Surgeon: David W Harding, MD;  Location: [facility];  Service: Cardiovascular;  Laterality: N/A;  . CARDIAC CATHETERIZATION N/A 12/16/2016   Procedure: Coronary Balloon Angioplasty;  Surgeon: David W Harding, MD;  Location: [facility];  Service: Cardiovascular;  Laterality: N/A;  . COLONOSCOPY W/ POLYPECTOMY    . CORONARY ANGIOPLASTY  09/25/2015   mid cir & om  . CORONARY ANGIOPLASTY WITH STENT PLACEMENT  10/09/2001   PTCA & stenting of mid AV circumflex; 2.5x13mm Pixel stent  . CORONARY ANGIOPLASTY WITH STENT PLACEMENT  12/13/2001   PCI with stent to mid L circumflex, 95% stenosis to 0% residual  . CORONARY ANGIOPLASTY WITH STENT PLACEMENT  10/10/2003   PCI to mid AV circumflex; LAD 30% disease; RCA 100% occluded prox.  . CORONARY ANGIOPLASTY WITH STENT PLACEMENT  09/01/2011   PCI with stenting with bare metal stent to mid AV groove circumflex and PDA  . CORONARY ANGIOPLASTY WITH STENT PLACEMENT  10/17/2011   cutting balloon angioplasty of ostial lateral OM1 branch and bifurcation AV groove circumflex OM junction; stenosis reduced to 0%  . EXCISIONAL HEMORRHOIDECTOMY    . LEFT HEART CATHETERIZATION WITH CORONARY ANGIOGRAM N/A 10/18/2011   Procedure: LEFT HEART CATHETERIZATION WITH CORONARY ANGIOGRAM;  Surgeon: David W Harding, MD;  Location: [facility];  Service: Cardiovascular;  Laterality: N/A;  .    Subjective:  Patient ID: Andrew West, male    DOB: 01/08/1966  Age: 51 y.o. MRN: [id number]  CC: No chief complaint on file.   HPI Andrew West is a 51 y.o. with a  Medical history of Type 2 DM (A1c 5.8), CAD s/p PCI/stent to LCx and OM1 In 09/2015; cutting balloon angioplasty to OM 1, HLD, HTN, previous tobacco abuse, chronic back pain s/p laminectomy/discectomy (3 years ago by Dr Poole), depression who comes into the clinic for follow-up of low abdominal pain.  At his last visit 1 month ago he had presented with acute abdominal pain with tenderness in his right lower quadrant on exam and also had symptoms of urinary retention at the time.  Commenced on Flomax with improvement in symptoms; he was unable to undergo CT of the abdomen and pelvis with contrast which was ordered to rule out diverticulitis given his previous reaction to contrast.  He informs me abdominal pain now is at a 2-3/10 and his urinary symptoms have improved significantly. He has no other acute concerns today.  Past Medical History:  Diagnosis Date  . Anxiety   . CAD (coronary artery disease)    a. s/p multiple PCIs with last cath 11/2016 with severe multivessel CAD, s/p PCTA to LCx but unable to pass stent  . Chronic leg pain    bilateral  . Chronic lower back pain   . COPD (chronic obstructive pulmonary disease) (HCC)   . Depression   . GERD (gastroesophageal reflux disease)    Takes Dexilant  . HLD (hyperlipidemia)   . Hypertension   . Rhabdomyolysis    h/o, r/t statins  . Sleep apnea    "can't tolerate mask" (12/16/2016)  . Type II diabetes mellitus (HCC)     Past Surgical History:  Procedure Laterality Date  . BACK SURGERY    . CARDIAC CATHETERIZATION N/A 09/25/2015   Procedure: Left Heart Cath and Coronary Angiography;  Surgeon: David W Harding, MD;  Location: [facility];  Service: Cardiovascular;  Laterality: N/A;  . CARDIAC CATHETERIZATION N/A 12/16/2016   Procedure: Left Heart Cath  and Coronary Angiography;  Surgeon: David W Harding, MD;  Location: [facility];  Service: Cardiovascular;  Laterality: N/A;  . CARDIAC CATHETERIZATION N/A 12/16/2016   Procedure: Coronary Balloon Angioplasty;  Surgeon: David W Harding, MD;  Location: [facility];  Service: Cardiovascular;  Laterality: N/A;  . COLONOSCOPY W/ POLYPECTOMY    . CORONARY ANGIOPLASTY  09/25/2015   mid cir & om  . CORONARY ANGIOPLASTY WITH STENT PLACEMENT  10/09/2001   PTCA & stenting of mid AV circumflex; 2.5x13mm Pixel stent  . CORONARY ANGIOPLASTY WITH STENT PLACEMENT  12/13/2001   PCI with stent to mid L circumflex, 95% stenosis to 0% residual  . CORONARY ANGIOPLASTY WITH STENT PLACEMENT  10/10/2003   PCI to mid AV circumflex; LAD 30% disease; RCA 100% occluded prox.  . CORONARY ANGIOPLASTY WITH STENT PLACEMENT  09/01/2011   PCI with stenting with bare metal stent to mid AV groove circumflex and PDA  . CORONARY ANGIOPLASTY WITH STENT PLACEMENT  10/17/2011   cutting balloon angioplasty of ostial lateral OM1 branch and bifurcation AV groove circumflex OM junction; stenosis reduced to 0%  . EXCISIONAL HEMORRHOIDECTOMY    . LEFT HEART CATHETERIZATION WITH CORONARY ANGIOGRAM N/A 10/18/2011   Procedure: LEFT HEART CATHETERIZATION WITH CORONARY ANGIOGRAM;  Surgeon: David W Harding, MD;  Location: [facility];  Service: Cardiovascular;  Laterality: N/A;  .   Subjective:  Patient ID: Andrew West, male    DOB: [birth year]  Age: 51 y.o. MRN: [id number]  CC: No chief complaint on file.   HPI Andrew West is a 51 y.o. with a  Medical history of Type 2 DM (A1c 5.8), CAD s/p PCI/stent to LCx and OM1 In 09/2015; cutting balloon angioplasty to OM 1, HLD, HTN, previous tobacco abuse, chronic back pain s/p laminectomy/discectomy (3 years ago by Dr Trenton Gammon), depression who comes into the clinic for follow-up of low abdominal pain.  At his last visit 1 month ago he had presented with acute abdominal pain with tenderness in his right lower quadrant on exam and also had symptoms of urinary retention at the time.  Commenced on Flomax with improvement in symptoms; he was unable to undergo CT of the abdomen and pelvis with contrast which was ordered to rule out diverticulitis given his previous reaction to contrast.  He informs me abdominal pain now is at a 2-3/10 and his urinary symptoms have improved significantly. He has no other acute concerns today.  Past Medical History:  Diagnosis Date  . Anxiety   . CAD (coronary artery disease)    a. s/p multiple PCIs with last cath 11/2016 with severe multivessel CAD, s/p PCTA to LCx but unable to pass stent  . Chronic leg pain    bilateral  . Chronic lower back pain   . COPD (chronic obstructive pulmonary disease) ([place])   . Depression   . GERD (gastroesophageal reflux disease)    Takes Dexilant  . HLD (hyperlipidemia)   . Hypertension   . Rhabdomyolysis    h/o, r/t statins  . Sleep apnea    "can't tolerate mask" (12/16/2016)  . Type II diabetes mellitus ([place])     Past Surgical History:  Procedure Laterality Date  . BACK SURGERY    . CARDIAC CATHETERIZATION N/A 09/25/2015   Procedure: Left Heart Cath and Coronary Angiography;  Surgeon: Leonie Man, MD;  Location: [facility] [facility];  Service: Cardiovascular;  Laterality: N/A;  . CARDIAC CATHETERIZATION N/A 12/16/2016   Procedure: Left Heart Cath  and Coronary Angiography;  Surgeon: Leonie Man, MD;  Location: [facility];  Service: Cardiovascular;  Laterality: N/A;  . CARDIAC CATHETERIZATION N/A 12/16/2016   Procedure: Coronary Balloon Angioplasty;  Surgeon: Leonie Man, MD;  Location: [facility];  Service: Cardiovascular;  Laterality: N/A;  . COLONOSCOPY W/ POLYPECTOMY    . CORONARY ANGIOPLASTY  09/25/2015   mid cir & om  . CORONARY ANGIOPLASTY WITH STENT PLACEMENT  10/09/2001   PTCA & stenting of mid AV circumflex; 2.5x64m Pixel stent  . CORONARY ANGIOPLASTY WITH STENT PLACEMENT  12/13/2001   PCI with stent to mid L circumflex, 95% stenosis to 0% residual  . CORONARY ANGIOPLASTY WITH STENT PLACEMENT  10/10/2003   PCI to mid AV circumflex; LAD 30% disease; RCA 100% occluded prox.  . CORONARY ANGIOPLASTY WITH STENT PLACEMENT  09/01/2011   PCI with stenting with bare metal stent to mid AV groove circumflex and PDA  . CORONARY ANGIOPLASTY WITH STENT PLACEMENT  10/17/2011   cutting balloon angioplasty of ostial lateral OM1 branch and bifurcation AV groove circumflex OM junction; stenosis reduced to 0%  . EXCISIONAL HEMORRHOIDECTOMY    . LEFT HEART CATHETERIZATION WITH CORONARY ANGIOGRAM N/A 10/18/2011   Procedure: LEFT HEART CATHETERIZATION WITH CORONARY ANGIOGRAM;  Surgeon: DLeonie Man MD;  [facility];  Service: Cardiovascular;  Laterality: N/A;  .

## 2017-11-17 NOTE — Patient Instructions (Signed)
Benign Prostatic Hyperplasia  Benign prostatic hyperplasia (BPH) is an enlarged prostate gland that is caused by the normal aging process and not by cancer. The prostate is a walnut-sized gland that is involved in the production of semen. It is located in front of the rectum and below the bladder. The bladder stores urine and the urethra is the tube that carries the urine out of the body. The prostate may get bigger as a man gets older.  An enlarged prostate can press on the urethra. This can make it harder to pass urine. The build-up of urine in the bladder can cause infection. Back pressure and infection may progress to bladder damage and kidney (renal) failure.  What are the causes?  This condition is part of a normal aging process. However, not all men develop problems from this condition. If the prostate enlarges away from the urethra, urine flow will not be blocked. If it enlarges toward the urethra and compresses it, there will be problems passing urine.  What increases the risk?  This condition is more likely to develop in men over the age of 50 years.  What are the signs or symptoms?  Symptoms of this condition include:  · Getting up often during the night to urinate.  · Needing to urinate frequently during the day.  · Difficulty starting urine flow.  · Decrease in size and strength of your urine stream.  · Leaking (dribbling) after urinating.  · Inability to pass urine. This needs immediate treatment.  · Inability to completely empty your bladder.  · Pain when you pass urine. This is more common if there is also an infection.  · Urinary tract infection (UTI).    How is this diagnosed?  This condition is diagnosed based on your medical history, a physical exam, and your symptoms. Tests will also be done, such as:  · A post-void bladder scan. This measures any amount of urine that may remain in your bladder after you finish urinating.  · A digital rectal exam. In a rectal exam, your health care provider  checks your prostate by putting a lubricated, gloved finger into your rectum to feel the back of your prostate gland. This exam detects the size of your gland and any abnormal lumps or growths.  · An exam of your urine (urinalysis).  · A prostate specific antigen (PSA) screening. This is a blood test used to screen for prostate cancer.  · An ultrasound. This test uses sound waves to electronically produce a picture of your prostate gland.    Your health care provider may refer you to a specialist in kidney and prostate diseases (urologist).  How is this treated?  Once symptoms begin, your health care provider will monitor your condition (active surveillance or watchful waiting). Treatment for this condition will depend on the severity of your condition. Treatment may include:  · Observation and yearly exams. This may be the only treatment needed if your condition and symptoms are mild.  · Medicines to relieve your symptoms, including:  ? Medicines to shrink the prostate.  ? Medicines to relax the muscle of the prostate.  · Surgery in severe cases. Surgery may include:  ? Prostatectomy. In this procedure, the prostate tissue is removed completely through an open incision or with a laparascope or robotics.  ? Transurethral resection of the prostate (TURP). In this procedure, a tool is inserted through the opening at the tip of the penis (urethra). It is used to cut away tissue of   the inner core of the prostate. The pieces are removed through the same opening of the penis. This removes the blockage.  ? Transurethral incision (TUIP). In this procedure, small cuts are made in the prostate. This lessens the prostate's pressure on the urethra.  ? Transurethral microwave thermotherapy (TUMT). This procedure uses microwaves to create heat. The heat destroys and removes a small amount of prostate tissue.  ? Transurethral needle ablation (TUNA). This procedure uses radio frequencies to destroy and remove a small amount of  prostate tissue.  ? Interstitial laser coagulation (ILC). This procedure uses a laser to destroy and remove a small amount of prostate tissue.  ? Transurethral electrovaporization (TUVP). This procedure uses electrodes to destroy and remove a small amount of prostate tissue.  ? Prostatic urethral lift. This procedure inserts an implant to push the lobes of the prostate away from the urethra.    Follow these instructions at home:  · Take over-the-counter and prescription medicines only as told by your health care provider.  · Monitor your symptoms for any changes. Contact your health care provider with any changes.  · Avoid drinking large amounts of liquid before going to bed or out in public.  · Avoid or reduce how much caffeine or alcohol you drink.  · Give yourself time when you urinate.  · Keep all follow-up visits as told by your health care provider. This is important.  Contact a health care provider if:  · You have unexplained back pain.  · Your symptoms do not get better with treatment.  · You develop side effects from the medicine you are taking.  · Your urine becomes very dark or has a bad smell.  · Your lower abdomen becomes distended and you have trouble passing your urine.  Get help right away if:  · You have a fever or chills.  · You suddenly cannot urinate.  · You feel lightheaded, or very dizzy, or you faint.  · There are large amounts of blood or clots in the urine.  · Your urinary problems become hard to manage.  · You develop moderate to severe low back or flank pain. The flank is the side of your body between the ribs and the hip.  These symptoms may represent a serious problem that is an emergency. Do not wait to see if the symptoms will go away. Get medical help right away. Call your local emergency services (911 in the [place] Do not drive yourself to the hospital.  Summary  · Benign prostatic hyperplasia (BPH) is an enlarged prostate that is caused by the normal aging process and not by  cancer.  · An enlarged prostate can press on the urethra. This can make it hard to pass urine.  · This condition is part of a normal aging process and is more likely to develop in men over the age of 50 years.  · Get help right away if you suddenly cannot urinate.  This information is not intended to replace advice given to you by your health care provider. Make sure you discuss any questions you have with your health care provider.  Document Released: 11/08/2005 Document Revised: 12/13/2016 Document Reviewed: 12/13/2016  [employer]vier Interactive Patient Education © 2018 [employer]

## 2017-11-28 ENCOUNTER — Emergency Department (HOSPITAL_COMMUNITY): Payer: Medicaid Other

## 2017-11-28 ENCOUNTER — Encounter (HOSPITAL_COMMUNITY): Payer: Self-pay | Admitting: Emergency Medicine

## 2017-11-28 ENCOUNTER — Telehealth: Payer: Self-pay | Admitting: Family Medicine

## 2017-11-28 ENCOUNTER — Observation Stay (HOSPITAL_COMMUNITY)
Admission: EM | Admit: 2017-11-28 | Discharge: 2017-11-29 | Disposition: A | Discharge status: Home / Self Care | Payer: Medicaid Other | Attending: Internal Medicine | Admitting: Internal Medicine

## 2017-11-28 ENCOUNTER — Telehealth: Payer: Self-pay | Admitting: Cardiology

## 2017-11-28 ENCOUNTER — Other Ambulatory Visit: Payer: Self-pay | Admitting: Internal Medicine

## 2017-11-28 DIAGNOSIS — I10 Essential (primary) hypertension: Secondary | ICD-10-CM | POA: Insufficient documentation

## 2017-11-28 DIAGNOSIS — J449 Chronic obstructive pulmonary disease, unspecified: Secondary | ICD-10-CM | POA: Diagnosis not present

## 2017-11-28 DIAGNOSIS — R0602 Shortness of breath: Secondary | ICD-10-CM | POA: Insufficient documentation

## 2017-11-28 DIAGNOSIS — I202 Refractory angina pectoris: Secondary | ICD-10-CM | POA: Diagnosis present

## 2017-11-28 DIAGNOSIS — I2510 Atherosclerotic heart disease of native coronary artery without angina pectoris: Secondary | ICD-10-CM

## 2017-11-28 DIAGNOSIS — I208 Other forms of angina pectoris: Secondary | ICD-10-CM | POA: Diagnosis present

## 2017-11-28 DIAGNOSIS — E119 Type 2 diabetes mellitus without complications: Secondary | ICD-10-CM | POA: Diagnosis not present

## 2017-11-28 DIAGNOSIS — Z7982 Long term (current) use of aspirin: Secondary | ICD-10-CM | POA: Diagnosis not present

## 2017-11-28 DIAGNOSIS — R079 Chest pain, unspecified: Secondary | ICD-10-CM | POA: Diagnosis present

## 2017-11-28 DIAGNOSIS — Z7951 Long term (current) use of inhaled steroids: Secondary | ICD-10-CM | POA: Insufficient documentation

## 2017-11-28 DIAGNOSIS — Z955 Presence of coronary angioplasty implant and graft: Secondary | ICD-10-CM | POA: Insufficient documentation

## 2017-11-28 DIAGNOSIS — I5189 Other ill-defined heart diseases: Secondary | ICD-10-CM | POA: Diagnosis present

## 2017-11-28 DIAGNOSIS — Z7984 Long term (current) use of oral hypoglycemic drugs: Secondary | ICD-10-CM | POA: Insufficient documentation

## 2017-11-28 DIAGNOSIS — E669 Obesity, unspecified: Secondary | ICD-10-CM | POA: Diagnosis present

## 2017-11-28 DIAGNOSIS — Z79899 Other long term (current) drug therapy: Secondary | ICD-10-CM | POA: Insufficient documentation

## 2017-11-28 DIAGNOSIS — I25110 Atherosclerotic heart disease of native coronary artery with unstable angina pectoris: Secondary | ICD-10-CM | POA: Diagnosis not present

## 2017-11-28 DIAGNOSIS — K219 Gastro-esophageal reflux disease without esophagitis: Secondary | ICD-10-CM | POA: Diagnosis not present

## 2017-11-28 DIAGNOSIS — I200 Unstable angina: Secondary | ICD-10-CM | POA: Diagnosis present

## 2017-11-28 DIAGNOSIS — F419 Anxiety disorder, unspecified: Secondary | ICD-10-CM | POA: Diagnosis present

## 2017-11-28 DIAGNOSIS — Z9861 Coronary angioplasty status: Secondary | ICD-10-CM

## 2017-11-28 LAB — BASIC METABOLIC PANEL
Anion gap: 8 (ref 5–15)
BUN: 10 mg/dL (ref 6–20)
CALCIUM: 8.8 mg/dL — AB (ref 8.9–10.3)
CHLORIDE: 106 mmol/L (ref 101–111)
CO2: 25 mmol/L (ref 22–32)
CREATININE: 0.83 mg/dL (ref 0.61–1.24)
GFR calc non Af Amer: >60 mL/min (ref >60)
Glucose, Bld: 109 mg/dL — ABNORMAL HIGH (ref 65–99)
Potassium: 3.7 mmol/L (ref 3.5–5.1)
SODIUM: 139 mmol/L (ref 135–145)

## 2017-11-28 LAB — CBC
HCT: 41.7 % (ref 39.0–52.0)
Hemoglobin: 13.9 g/dL (ref 13.0–17.0)
MCH: 27.2 pg (ref 26.0–34.0)
MCHC: 33.3 g/dL (ref 30.0–36.0)
MCV: 81.6 fL (ref 78.0–100.0)
PLATELETS: 247 10*3/uL (ref 150–400)
RBC: 5.11 MIL/uL (ref 4.22–5.81)
RDW: 13.7 % (ref 11.5–15.5)
WBC: 7.3 10*3/uL (ref 4.0–10.5)

## 2017-11-28 LAB — I-STAT TROPONIN, ED: TROPONIN I, POC: 0 ng/mL (ref 0.00–0.08)

## 2017-11-28 MED ORDER — NITROGLYCERIN IN D5W 200-5 MCG/ML-% IV SOLN
0.0000 ug/min | INTRAVENOUS | Status: DC
  Administered 2017-11-28: 5 ug/min via INTRAVENOUS
  Filled 2017-11-28: qty 250

## 2017-11-28 MED ORDER — NITROGLYCERIN 0.4 MG SL SUBL: 0.4000 mg | SUBLINGUAL_TABLET | SUBLINGUAL | Status: DC | PRN

## 2017-11-28 MED ORDER — MORPHINE SULFATE (PF) 4 MG/ML IV SOLN: 4.0000 mg | Freq: Once | INTRAVENOUS | Status: DC

## 2017-11-28 MED ORDER — MORPHINE SULFATE (PF) 4 MG/ML IV SOLN
4.0000 mg | Freq: Once | INTRAVENOUS | Status: AC
  Administered 2017-11-28: 4 mg via INTRAVENOUS
  Filled 2017-11-28: qty 1

## 2017-11-28 MED FILL — hydrALAZINE HCL 25 MG TABS: 25 | 30 days supply | Qty: 60 | Fill #0

## 2017-11-28 MED FILL — traZODone HCL 50 MG TABS: 50 | 30 days supply | Qty: 30 | Fill #2

## 2017-11-28 MED FILL — FLUoxetine HCL 20 MG CAPS: 20 | 30 days supply | Qty: 30 | Fill #0

## 2017-11-28 MED FILL — PANTOPRAZOLE SOD DR 40 MG T: 40 | 30 days supply | Qty: 30 | Fill #3

## 2017-11-28 MED FILL — CLOPIDOGREL 75 MG TABLET: 75 | 30 days supply | Qty: 30 | Fill #3

## 2017-11-28 MED FILL — ISOSORBIDE MN ER 30 MG TAB: 30 | 30 days supply | Qty: 120 | Fill #2

## 2017-11-28 MED FILL — metFORMIN HCL 1000 MG TABS: 1000 | 30 days supply | Qty: 60 | Fill #4

## 2017-11-28 MED FILL — AMLODIPINE BESYLATE 10 MG T: 10 | 30 days supply | Qty: 30 | Fill #0

## 2017-11-28 MED FILL — RANEXA ER 1,000 MG TABLET: 1000 | 30 days supply | Qty: 60 | Fill #2

## 2017-11-28 NOTE — Telephone Encounter (Signed)
Pt was called and pt states that he is having pain and bleeding in the rectal area please follow up, pt states that he is experiencing  the same pain as before when polyps were found.

## 2017-11-28 NOTE — Consult Note (Signed)
CARDIOLOGY CONSULT  Physician Requesting Consult:  Shirlyn Goltz, MD  HPI:  Andrew West is a 52 y.o. old male who presents from home with substernal chest pain.  The patient has an extensive history of CAD as noted below.  He is not felt to be a good candidate for repeat PCI and is not considered a bypass candidate due to small vessels.  He is currently on maximal medical therapy.  He currently rates his pain as an 8/10.  The pain is described as sharp in nature with radiation and numbness into his left arm and leg.  Nitroglycerin has had minimal improvement in his symptoms.  He looks comfortable in the bed.  ECG is unremarkable and his cardiac enzymes are negative.  The pain has been ongoing for over 7 hours now.    Assessment/Plan Chest Pain   Assessment:  The patient certainly has extensive CAD as noted by his previous caths.  He is also not determined a candidate for further attempts as revascularization.  Currently on maximal medical therapy.  Enzymes negative as is the ECG.  His pain certainly has some atypical features including feeling sharp in nature with radiation into not only his left arm but also his left leg.     Plan  -  Would NOT start heparin unless cardiac enzymes go up  -  Continue home medical therapy  -  Continue Nitro drip  -  Trend cardiac enzymes  Past Cardiovascular History:  +CAD +MI - No documented h/o CHF - No documented h/o PVD - No documented h/o AAA - No documented h/o valvular heart disease - No documented h/o CVA - No documented h/o Arrhythmias - No documented h/o A-fib  - No documented h/o congenital heart disease - No documented h/o CABG +PCI - No documented h/o cardiac devices (Pacer/ICD/CRT) - No documented h/o cardiac surgery       Most recent stress test:   07/07/16  The left ventricular ejection fraction is mildly decreased (45-54%).  Nuclear stress EF: 54%.  There was no ST segment deviation noted during stress.  The study is  normal.  This is a low risk study.  Most recent echocardiography:   08/26/15 - Procedure narrative: Transthoracic echocardiography. Image   quality was suboptimal. The study was technically difficult.   Intravenous contrast (Definity) was administered. - Left ventricle: The cavity size was normal. There was mild focal   basal hypertrophy of the septum. Systolic function was normal.   The estimated ejection fraction was in the range of 60% to 65%.   Wall motion was normal; there were no regional wall motion   abnormalities. Features are consistent with a pseudonormal left   ventricular filling pattern, with concomitant abnormal relaxation   and increased filling pressure (grade 2 diastolic dysfunction).   Doppler parameters are consistent with high ventricular filling   pressure. - Mitral valve: There was trivial regurgitation. - Left atrium: The atrium was mildly dilated. - Pulmonic valve: There was trivial regurgitation. - Pulmonary arteries: PA peak pressure: 31 mm Hg (S).  Most recent left heart catheterization:   12/16/16  Ost 2nd Mrg to 2nd Mrg lesion, 99% stenosed. Post intervention, 99% residual stenosis remained. The lesion was previously treated with a bare metal stentgreater than two years ago.  Mid Cx-2 lesion, 90% stenosed. Post intervention, there is a 20% residual stenosis.  Mid Cx-1 lesion, 80% stenosed. Post intervention, there is a 50% residual stenosis. The lesion was previously treated with a bare  metal stent.  Ramus lesion, 100% stenosed. The lesion was previously treated with a bare metal stentgreater than two years ago.  Prox RCA lesion, 100% stenosed.  There is mild left ventricular systolic dysfunction.  Moderately elevated LVEDP of 20 mmHg  CABG:  Date/ Physician: None  Device history:  None  Past Medical History:  Diagnosis Date  . Anxiety   . CAD (coronary artery disease)    a. s/p multiple PCIs with last cath 11/2016 with severe multivessel  CAD, s/p PCTA to LCx but unable to pass stent  . Chronic leg pain    bilateral  . Chronic lower back pain   . COPD (chronic obstructive pulmonary disease) ([place])   . Depression   . GERD (gastroesophageal reflux disease)    Takes Dexilant  . HLD (hyperlipidemia)   . Hypertension   . Rhabdomyolysis    h/o, r/t statins  . Sleep apnea    "can't tolerate mask" (12/16/2016)  . Type II diabetes mellitus ([place])     Past Surgical History:  Procedure Laterality Date  . BACK SURGERY    . CARDIAC CATHETERIZATION N/A 09/25/2015   Procedure: Left Heart Cath and Coronary Angiography;  Surgeon: Leonie Man, MD;  Location: [place] [facility];  Service: Cardiovascular;  Laterality: N/A;  . CARDIAC CATHETERIZATION N/A 12/16/2016   Procedure: Left Heart Cath and Coronary Angiography;  Surgeon: Leonie Man, MD;  Location: [place] [facility];  Service: Cardiovascular;  Laterality: N/A;  . CARDIAC CATHETERIZATION N/A 12/16/2016   Procedure: Coronary Balloon Angioplasty;  Surgeon: Leonie Man, MD;  Location: [place] [facility];  Service: Cardiovascular;  Laterality: N/A;  . COLONOSCOPY W/ POLYPECTOMY    . CORONARY ANGIOPLASTY  09/25/2015   mid cir & om  . CORONARY ANGIOPLASTY WITH STENT PLACEMENT  10/09/2001   PTCA & stenting of mid AV circumflex; 2.5x42mm Pixel stent  . CORONARY ANGIOPLASTY WITH STENT PLACEMENT  12/13/2001   PCI with stent to mid L circumflex, 95% stenosis to 0% residual  . CORONARY ANGIOPLASTY WITH STENT PLACEMENT  10/10/2003   PCI to mid AV circumflex; LAD 30% disease; RCA 100% occluded prox.  . CORONARY ANGIOPLASTY WITH STENT PLACEMENT  09/01/2011   PCI with stenting with bare metal stent to mid AV groove circumflex and PDA  . CORONARY ANGIOPLASTY WITH STENT PLACEMENT  10/17/2011   cutting balloon angioplasty of ostial lateral OM1 branch and bifurcation AV groove circumflex OM junction; stenosis reduced to 0%  . EXCISIONAL HEMORRHOIDECTOMY    . LEFT HEART CATHETERIZATION  WITH CORONARY ANGIOGRAM N/A 10/18/2011   Procedure: LEFT HEART CATHETERIZATION WITH CORONARY ANGIOGRAM;  Surgeon: Leonie Man, MD;  [facility];  Service: Cardiovascular;  Laterality: N/A;  . LUMBAR LAMINECTOMY/DECOMPRESSION MICRODISCECTOMY  03/31/2012   Procedure: LUMBAR LAMINECTOMY/DECOMPRESSION MICRODISCECTOMY 1 LEVEL;  Surgeon: Charlie Pitter, MD;  Location: [place] [facility]URO ORS;  Service: Neurosurgery;  Laterality: Left;  . TRANSTHORACIC ECHOCARDIOGRAM  07/28/2011   EF 55-65%; LVH, grade 1 diastolic dysfunction;     Social History   Socioeconomic History  . Marital status: Single    Spouse name: Not on file  . Number of children: Not on file  . Years of education: Not on file  . Highest education level: Not on file  Social Needs  . Financial resource strain: Not on file  . Food insecurity - worry: Not on file  . Food insecurity - inability: Not on file  . Transportation needs - medical: Not on file  .  Transportation needs - non-medical: Not on file  Occupational History  . Not on file  Tobacco Use  . Smoking status: Former Smoker    Packs/day: 0.25    Years: 25.00    Pack years: 6.25    Types: Cigarettes    Last attempt to quit: 05/24/2015    Years since quitting: 2.5  . Smokeless tobacco: Never Used  Substance and Sexual Activity  . Alcohol use: No    Alcohol/week: 0.0 oz  . Drug use: No  . Sexual activity: Yes  Other Topics Concern  . Not on file  Social History Narrative  . Not on file    Family History  Problem Relation Age of Onset  . Heart attack Father   . Hypertension Mother   . Diabetes Mother   . Heart disease Brother        x 3   . Heart attack Brother        deceased  . Hypertension Sister   . Diabetes Sister   . Anesthesia problems Neg Hx   . Hypotension Neg Hx   . Malignant hyperthermia Neg Hx   . Pseudochol deficiency Neg Hx     No intake or output data in the 24 hours ending 11/28/17 2343  MEDS:  nitroGLYCERIN Last Rate: 10 mcg/min  (11/28/17 2337)      Review of Systems:  GEN: no fever, chills, nausea, vomiting, weight change  HEENT: no vision or hearing changes  PULM: no coughing, +SOB  CV: +chest pain, palpitations, PND, orthopnea  GI: no abdominal pain  GU: no dysuria  EXT: no swelling  SKIN: no rashes  NEURO: no numbness or tingling  HEME: no bleeding or bruising  GYN: none  --12 point review systems- otherwise negative.  Physical Examination: Blood pressure (!) 141/98, pulse 81, temperature 98.8 F (37.1 C), temperature source Oral, resp. rate (!) 21, SpO2 95 %. General:  AAOX 4.  NAD.  NRD.  Appears comfortable in the bed. HENT: Normocephalic. Atraumatic.  No acute abnom. EYES: PERRL EOMI  Neck: Supple.  No JVD.  No bruits. Cardiovascular:  Nl S1. Nl S2. No S3. No S4. Nl PMI. No m/r/c. RRR  Pulmonary/Chest: CTA B. No rales. No wheezing.  Abdomen: Soft, NT, no masses, no organomegaly. Neuro: CN intact, no motor/sensory deficit.  Ext: Warm. No edema.  SKIN- intact  Recent Labs    11/28/17 2031  HGB 13.9  HCT 41.7  WBC 7.3  BUN 10  CREATININE 0.83  GLUCOSE 109*  CALCIUM 8.8*    Discuss the benefits and adverse side affects of the medications use.  Discuss the benefits and adverse side affects of the required study.  Discuss the risk and benefits of ambulation during hospitalization.   Baruch Merl, MD, PhD Cardiology

## 2017-11-28 NOTE — ED Provider Notes (Signed)
[place] EMERGENCY DEPARTMENT Provider Note   CSN: [id number] Arrival date & time: 11/28/17  2020     History   Chief Complaint Chief Complaint  Patient presents with  . Chest Pain    HPI Andrew West is a 52 y.o. male.  Patient with PMH of CAD, last cath 11/2016, HTN, HL, DM2 presents to the ED with a chief complaint of chest pain.  He states that he has had central chest pain that radiates to his left arm for the past 6 hours.  He reports mild associated SOB.  Denies any diaphoresis or nausea.  He denies fevers, chills, or productive cough.  He has taken ASA and nitro with minimal relief. He currently rates his pain as an 8/10.   The history is provided by the patient. No language interpreter was used.    Past Medical History:  Diagnosis Date  . Anxiety   . CAD (coronary artery disease)    a. s/p multiple PCIs with last cath 11/2016 with severe multivessel CAD, s/p PCTA to LCx but unable to pass stent  . Chronic leg pain    bilateral  . Chronic lower back pain   . COPD (chronic obstructive pulmonary disease) ([place])   . Depression   . GERD (gastroesophageal reflux disease)    Takes Dexilant  . HLD (hyperlipidemia)   . Hypertension   . Rhabdomyolysis    h/o, r/t statins  . Sleep apnea    "can't tolerate mask" (12/16/2016)  . Type II diabetes mellitus [facility])     Patient Active Problem List   Diagnosis Date Noted  . Benign prostatic hyperplasia with urinary hesitancy 11/17/2017  . Preoperative cardiovascular examination 07/12/2017  . Refractory angina ([place]) 05/24/2017  . Unstable angina ([place]) 05/10/2017  . Complex regional pain syndrome type I 03/24/2017  . Progressive angina ([place]) -Class III 12/16/2016  . Anxiety 06/28/2016  . Diastolic dysfunction-grade 2 with EF 60-65% Oct 2016 03/22/2016  . Hypertension 10/03/2015  . CAD S/P multiple PCI's 10/03/2015  . Pulmonary nodule 06/24/2015  . Cough 06/24/2015  . COPD with asthma ([place]) 06/24/2015   . Reactive airway disease 06/04/2015  . DOE (dyspnea on exertion) 04/15/2015  . Lumbar disc herniation with radiculopathy 03/31/2012  . Presence of stent in left circumflex coronary artery 10/18/2011    Class: History of  . TOBACCO ABUSE 02/27/2009  . ABDOMINAL PAIN, LEFT LOWER QUADRANT 12/30/2008  . ABDOMINAL PAIN, EPIGASTRIC 12/05/2008  . Depression 09/05/2008  . Hereditary and idiopathic peripheral neuropathy 09/05/2008  . GERD 09/05/2008  . Cervical disc disorder with radiculopathy of cervical region 08/19/2008  . HLD (hyperlipidemia) 04/25/2008    Class: Diagnosis of  . ALLERGIC RHINITIS 04/25/2008  . Backache 04/25/2008  . Chest pain, unspecified 04/25/2008  . COLONIC POLYPS, HX OF 04/25/2008  . Obesity 04/18/2008    Class: Diagnosis of  . ANAL FISSURE, HX OF 04/18/2008  . Diabetes mellitus ([place]) 01/30/2008    Class: History of  . RECTAL BLEEDING 01/30/2008    Past Surgical History:  Procedure Laterality Date  . BACK SURGERY    . CARDIAC CATHETERIZATION N/A 09/25/2015   Procedure: Left Heart Cath and Coronary Angiography;  Surgeon: Leonie Man, MD;  Location: [place] CV LAB;  Service: Cardiovascular;  Laterality: N/A;  . CARDIAC CATHETERIZATION N/A 12/16/2016   Procedure: Left Heart Cath and Coronary Angiography;  Surgeon: Leonie Man, MD;  Location: [place] CV LAB;  Service: Cardiovascular;  Laterality: N/A;  .  CARDIAC CATHETERIZATION N/A 12/16/2016   Procedure: Coronary Balloon Angioplasty;  Surgeon: Leonie Man, MD;  Location: [place] CV LAB;  Service: Cardiovascular;  Laterality: N/A;  . COLONOSCOPY W/ POLYPECTOMY    . CORONARY ANGIOPLASTY  09/25/2015   mid cir & om  . CORONARY ANGIOPLASTY WITH STENT PLACEMENT  10/09/2001   PTCA & stenting of mid AV circumflex; 2.5x61m Pixel stent  . CORONARY ANGIOPLASTY WITH STENT PLACEMENT  12/13/2001   PCI with stent to mid L circumflex, 95% stenosis to 0% residual  . CORONARY ANGIOPLASTY WITH STENT  PLACEMENT  10/10/2003   PCI to mid AV circumflex; LAD 30% disease; RCA 100% occluded prox.  . CORONARY ANGIOPLASTY WITH STENT PLACEMENT  09/01/2011   PCI with stenting with bare metal stent to mid AV groove circumflex and PDA  . CORONARY ANGIOPLASTY WITH STENT PLACEMENT  10/17/2011   cutting balloon angioplasty of ostial lateral OM1 branch and bifurcation AV groove circumflex OM junction; stenosis reduced to 0%  . EXCISIONAL HEMORRHOIDECTOMY    . LEFT HEART CATHETERIZATION WITH CORONARY ANGIOGRAM N/A 10/18/2011   Procedure: LEFT HEART CATHETERIZATION WITH CORONARY ANGIOGRAM;  Surgeon: DLeonie Man MD;  [facility];  Service: Cardiovascular;  Laterality: N/A;  . LUMBAR LAMINECTOMY/DECOMPRESSION MICRODISCECTOMY  03/31/2012   Procedure: LUMBAR LAMINECTOMY/DECOMPRESSION MICRODISCECTOMY 1 LEVEL;  Surgeon: HCharlie Pitter MD;  [facility];  Service: Neurosurgery;  Laterality: Left;  . TRANSTHORACIC ECHOCARDIOGRAM  07/28/2011   EF 55-65%; LVH, grade 1 diastolic dysfunction;        Home Medications    Prior to Admission medications   Medication Sig Start Date End Date Taking? Authorizing Provider  acetaminophen-codeine (TYLENOL #3) 300-30 MG tablet Take 1 tablet every 12 (twelve) hours as needed by mouth for moderate pain. 10/10/17   AArnoldo Morale MD  Albuterol Sulfate (PROAIR RESPICLICK) 1381(90 Base) MCG/ACT AEPB Inhale 1 puff into the lungs every 6 (six) hours as needed (shortness of breath). 04/08/17   AArnoldo Morale MD  amLODipine (NORVASC) 10 MG tablet Take 1 tablet (10 mg total) daily by mouth. 10/10/17   AArnoldo Morale MD  aspirin 81 MG tablet Take 1 tablet (81 mg total) daily by mouth. 10/10/17   AArnoldo Morale MD  atorvastatin (LIPITOR) 40 MG tablet Take 1 tablet (40 mg total) daily by mouth. 10/10/17   AArnoldo Morale MD  Blood Glucose Monitoring Suppl (ACCU-CHEK AVIVA PLUS) w/Device KIT Use as dircted 07/12/17   AArnoldo Morale MD  Blood Pressure Monitoring (BLOOD  PRESSURE CUFF) MISC 1 Units by Does not apply route once. 07/12/17 07/12/17  AArnoldo Morale MD  cetirizine (ZYRTEC) 10 MG tablet Take 1 tablet (10 mg total) daily by mouth. 10/10/17   AArnoldo Morale MD  clopidogrel (PLAVIX) 75 MG tablet Take 1 tablet (75 mg total) daily by mouth. 10/10/17   AArnoldo Morale MD  FLUoxetine (PROZAC) 20 MG capsule TAKE 1 TABLET BY MOUTH DAILY. 10/10/17   AArnoldo Morale MD  fluticasone (FLONASE) 50 MCG/ACT nasal spray Place 2 sprays into both nostrils daily. 02/24/17   MArgentina Donovan PA-C  furosemide (LASIX) 20 MG tablet Take 20 mg by mouth 2 (two) times daily.    [provider]  glucose blood (ACCU-CHEK AVIVA) test strip Use as instructed 07/12/17   AArnoldo Morale MD  hydrALAZINE (APRESOLINE) 25 MG tablet TAKE 1 TABLET BY MOUTH 2 TIMES DAILY. 11/28/17   Hilty, KNadean Corwin MD  hydrocortisone-pramoxine ([facility] 2.5-1 % rectal cream PLACE 1 APPLICATION RECTALLY 3 (THREE)  TIMES DAILY. 08/25/17   Arnoldo Morale, MD  isosorbide mononitrate (IMDUR) 120 MG 24 hr tablet Take 1 tablet (120 mg total) daily by mouth. 10/10/17   Arnoldo Morale, MD  metFORMIN (GLUCOPHAGE) 1000 MG tablet Take 1 tablet (1,000 mg total) 2 (two) times daily with a meal by mouth. 10/10/17   Arnoldo Morale, MD  mometasone (ASMANEX) 220 MCG/INH inhaler Inhale 2 puffs daily into the lungs. 10/10/17   Arnoldo Morale, MD  Nebivolol HCl 20 MG TABS Take 1 tablet (20 mg total) by mouth daily. 11/08/16   Hilty, Nadean Corwin, MD  nitroGLYCERIN (NITROSTAT) 0.4 MG SL tablet Place 1 tablet (0.4 mg total) under the tongue every 5 (five) minutes as needed for chest pain. 12/24/16   Ahmed Prima, Fransisco Hertz, PA-C  oxyCODONE-acetaminophen (PERCOCET/ROXICET) 5-325 MG tablet Take 1-2 tablets by mouth every 4 (four) hours as needed for severe pain. Patient not taking: Reported on 11/17/2017 06/06/17   Charlann Lange, PA-C  pantoprazole (PROTONIX) 40 MG tablet Take 1 tablet (40 mg total) daily by mouth. 10/10/17   Arnoldo Morale, MD    penicillin v potassium (VEETID) 500 MG tablet Take 1 tablet (500 mg total) by mouth 4 (four) times daily. Patient not taking: Reported on 11/17/2017 06/06/17   Charlann Lange, PA-C  pregabalin (LYRICA) 75 MG capsule Take 1 capsule (75 mg total) by mouth 3 (three) times daily. 07/12/17   Hilty, Nadean Corwin, MD  ranolazine (RANEXA) 1000 MG SR tablet Take 1 tablet (1,000 mg total) by mouth 2 (two) times daily. 05/24/17   Hilty, Nadean Corwin, MD  tamsulosin (FLOMAX) 0.4 MG CAPS capsule Take 1 capsule (0.4 mg total) daily by mouth. 10/10/17   Arnoldo Morale, MD  tiotropium (SPIRIVA HANDIHALER) 18 MCG inhalation capsule Place 1 capsule (18 mcg total) daily into inhaler and inhale. 10/10/17   Arnoldo Morale, MD  traZODone (DESYREL) 50 MG tablet Take 1 tablet (50 mg total) at bedtime as needed by mouth. for sleep 10/10/17   Arnoldo Morale, MD  valsartan-hydrochlorothiazide (DIOVAN-HCT) 320-25 MG tablet Take 1 tablet daily by mouth. 10/10/17   Arnoldo Morale, MD    Family History Family History  Problem Relation Age of Onset  . Heart attack Father   . Hypertension Mother   . Diabetes Mother   . Heart disease Brother        x 3   . Heart attack Brother        deceased  . Hypertension Sister   . Diabetes Sister   . Anesthesia problems Neg Hx   . Hypotension Neg Hx   . Malignant hyperthermia Neg Hx   . Pseudochol deficiency Neg Hx     Social History Social History   Tobacco Use  . Smoking status: Former Smoker    Packs/day: 0.25    Years: 25.00    Pack years: 6.25    Types: Cigarettes    Last attempt to quit: 05/24/2015    Years since quitting: 2.5  . Smokeless tobacco: Never Used  Substance Use Topics  . Alcohol use: No    Alcohol/week: 0.0 oz  . Drug use: No     Allergies   Iohexol   Review of Systems Review of Systems  All other systems reviewed and are negative.    Physical Exam Updated Vital Signs BP (!) 143/86   Pulse 78   Temp 98.8 F (37.1 C) (Oral)   Resp 19   SpO2  100%   Physical Exam  Constitutional: He is oriented to  person, place, and time. He appears well-developed and well-nourished.  HENT:  Head: Normocephalic and atraumatic.  Eyes: Conjunctivae and EOM are normal. Pupils are equal, round, and reactive to light. Right eye exhibits no discharge. Left eye exhibits no discharge. No scleral icterus.  Neck: Normal range of motion. Neck supple. No JVD present.  Cardiovascular: Normal rate, regular rhythm and normal heart sounds. Exam reveals no gallop and no friction rub.  No murmur heard. Pulmonary/Chest: Effort normal and breath sounds normal. No respiratory distress. He has no wheezes. He has no rales. He exhibits no tenderness.  Abdominal: Soft. He exhibits no distension and no mass. There is no tenderness. There is no rebound and no guarding.  Musculoskeletal: Normal range of motion. He exhibits no edema or tenderness.  Neurological: He is alert and oriented to person, place, and time.  Skin: Skin is warm and dry.  Psychiatric: He has a normal mood and affect. His behavior is normal. Judgment and thought content normal.  Nursing note and vitals reviewed.    ED Treatments / Results  Labs (all labs ordered are listed, but only abnormal results are displayed) Labs Reviewed  BASIC METABOLIC PANEL  CBC  I-STAT TROPONIN, ED    EKG  EKG Interpretation None       Radiology No results found.  Procedures Procedures (including critical care time) CRITICAL CARE Performed by: Montine Circle   Total critical care time: 33 minutes  Critical care time was exclusive of separately billable procedures and treating other patients.  Critical care was necessary to treat or prevent imminent or life-threatening deterioration.  Critical care was time spent personally by me on the following activities: development of treatment plan with patient and/or surrogate as well as nursing, discussions with consultants, evaluation of patient's response to  treatment, examination of patient, obtaining history from patient or surrogate, ordering and performing treatments and interventions, ordering and review of laboratory studies, ordering and review of radiographic studies, pulse oximetry and re-evaluation of patient's condition.  Medications Ordered in ED Medications  morphine 4 MG/ML injection 4 mg (not administered)     Initial Impression / Assessment and Plan / ED Course  I have reviewed the triage vital signs and the nursing notes.  Pertinent labs & imaging results that were available during my care of the patient were reviewed by me and considered in my medical decision making (see chart for details).     Patient presents with chest pain that is central and radiates to the left arm. He has some associated SOB.  Review of prior visits shows well documented significant CAD with multi vessel stenosis seen on most recent cath.  DDx includes ACS, PE, pneumothorax, aortic dissection, esophageal rupture, pericarditis, chest wall pain.  Doubt ACS, normal troponin, no ischemic EKG findings, HEART score is: 4, but still has active chest pain and significant disease.  Will start nitro drip per Dr. Darl Householder recommendations.  Well's PE score is 0, patient is not tachycardic nor hypoxic.  No evidence of pneumothorax on CXR.  Doubt dissection, no mediastinal widening on CXR, no ripping/tearing chest pain, neurovascularly intact.  Doubt pericarditis, no positional changes, or diffuse ST elevations on EKG.  Pain is not reproducible, doubt MSK.   Patient seen by and discussed with Dr. Darl Householder.  11:07 PM Discussed the case with Dr. Kenton Kingfisher from cardiology, who recommends admission to medicine and he will consult.  We spoke about giving heparin, and Dr. Kenton Kingfisher felt that the benefits did not outweigh the risks and  that with the symptoms having been ongoing it is unusual that we are not seeing some elevation of the troponin.  We reviewed the chart together, and while  the patient does have severe CAD, he recommends to also consider non-cardiac causes of chest pain given no EKG changes nor elevation of troponin.  Will consult medicine for admission.  All findings were discussed with patient.  Patient understands and agrees with the plan.    Appreciate Dr. Shanon Brow for admitting the patient.  Final Clinical Impressions(s) / ED Diagnoses   Final diagnoses:  Nonspecific chest pain    ED Discharge Orders    None       Montine Circle, PA-C 11/28/17 2338    Drenda Freeze, MD 11/30/17 [phone]

## 2017-11-28 NOTE — ED Notes (Signed)
Patient transported to X-ray 

## 2017-11-28 NOTE — Telephone Encounter (Signed)
Pt called need to speak with the nurse is very pesonal (did not want to say what), she said maybe is the same issue he was operated last year, please call him back

## 2017-11-28 NOTE — Telephone Encounter (Signed)
REFILL 

## 2017-11-28 NOTE — Telephone Encounter (Signed)
Advised to use sitz bath, OTC hemorrhoid cream, avoid foods that cause constipation and schedule an appointment for a visit.

## 2017-11-28 NOTE — Telephone Encounter (Signed)
Pt called answering service to report having chest pain. I called him back and he reports that he has been having chest pain that is worse than it has been and now with aching and numbness of the left arm. His record indicated significant CAD. He states that he is getting ready to call 911. He has a roommate with him so he is not alone. He wanted to get dressed and then call 911. I advised him to call first then worry about dressing. He says he will.   Daune Perch, AGNP-C [facility] 11/28/2017  7:26 PM

## 2017-11-28 NOTE — ED Triage Notes (Signed)
Per [facility],  Pt from home. 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 Pt reports Central CP radiating to L arm starting 2 hours ago. Pt also reports SHOB, dizziness, and nausea. Pt took 325 of ASA and 2 nitro at home. EMS gave pt 2 additional nitro and 4 mg of morphine. Pt reports minimal improvement of pain 8/10.

## 2017-11-29 DIAGNOSIS — K219 Gastro-esophageal reflux disease without esophagitis: Secondary | ICD-10-CM

## 2017-11-29 DIAGNOSIS — Z9861 Coronary angioplasty status: Secondary | ICD-10-CM

## 2017-11-29 DIAGNOSIS — I519 Heart disease, unspecified: Secondary | ICD-10-CM

## 2017-11-29 DIAGNOSIS — I208 Other forms of angina pectoris: Secondary | ICD-10-CM | POA: Diagnosis not present

## 2017-11-29 DIAGNOSIS — I2510 Atherosclerotic heart disease of native coronary artery without angina pectoris: Secondary | ICD-10-CM | POA: Diagnosis not present

## 2017-11-29 DIAGNOSIS — I200 Unstable angina: Secondary | ICD-10-CM | POA: Diagnosis not present

## 2017-11-29 LAB — HEPATIC FUNCTION PANEL
ALBUMIN: 3.2 g/dL — AB (ref 3.5–5.0)
ALT: 13 U/L — AB (ref 17–63)
AST: 14 U/L — AB (ref 15–41)
Alkaline Phosphatase: 62 U/L (ref 38–126)
BILIRUBIN TOTAL: 0.7 mg/dL (ref 0.3–1.2)
Bilirubin, Direct: <0.1 mg/dL — ABNORMAL LOW (ref 0.1–0.5)
Total Protein: 6.4 g/dL — ABNORMAL LOW (ref 6.5–8.1)

## 2017-11-29 LAB — CBG MONITORING, ED: GLUCOSE-CAPILLARY: 101 mg/dL — AB (ref 65–99)

## 2017-11-29 LAB — BRAIN NATRIURETIC PEPTIDE: B Natriuretic Peptide: 50.4 pg/mL (ref 0.0–100.0)

## 2017-11-29 LAB — D-DIMER, QUANTITATIVE (NOT AT ARMC): D DIMER QUANT: 0.43 ug{FEU}/mL (ref 0.00–0.50)

## 2017-11-29 LAB — LIPASE, BLOOD: Lipase: 27 U/L (ref 11–51)

## 2017-11-29 LAB — TROPONIN I: Troponin I: <0.03 ng/mL (ref <0.03)

## 2017-11-29 MED ORDER — HYDRALAZINE HCL 25 MG PO TABS
25.0000 mg | ORAL_TABLET | Freq: Two times a day (BID) | ORAL | Status: DC
  Administered 2017-11-29 (×2): 25 mg via ORAL
  Filled 2017-11-29 (×2): qty 1

## 2017-11-29 MED ORDER — ACETAMINOPHEN 325 MG PO TABS: 650.0000 mg | ORAL_TABLET | ORAL | Status: DC | PRN

## 2017-11-29 MED ORDER — INSULIN ASPART 100 UNIT/ML ~~LOC~~ SOLN: 0.0000 [IU] | Freq: Three times a day (TID) | SUBCUTANEOUS | Status: DC

## 2017-11-29 MED ORDER — BUDESONIDE 0.5 MG/2ML IN SUSP
2.0000 mL | Freq: Two times a day (BID) | RESPIRATORY_TRACT | Status: DC
  Administered 2017-11-29: 0.5 mg via RESPIRATORY_TRACT
  Filled 2017-11-29: qty 2

## 2017-11-29 MED ORDER — NEBIVOLOL HCL 10 MG PO TABS
20.0000 mg | ORAL_TABLET | Freq: Every day | ORAL | Status: DC
  Administered 2017-11-29: 20 mg via ORAL
  Filled 2017-11-29: qty 2

## 2017-11-29 MED ORDER — GI COCKTAIL ~~LOC~~: 30.0000 mL | Freq: Four times a day (QID) | ORAL | Status: DC | PRN

## 2017-11-29 MED ORDER — ONDANSETRON HCL 4 MG/2ML IJ SOLN: 4.0000 mg | Freq: Four times a day (QID) | INTRAMUSCULAR | Status: DC | PRN

## 2017-11-29 MED ORDER — PREGABALIN 25 MG PO CAPS
75.0000 mg | ORAL_CAPSULE | Freq: Three times a day (TID) | ORAL | Status: DC
  Administered 2017-11-29: 75 mg via ORAL
  Filled 2017-11-29: qty 3

## 2017-11-29 MED ORDER — ATORVASTATIN CALCIUM 40 MG PO TABS
40.0000 mg | ORAL_TABLET | Freq: Every day | ORAL | Status: DC
  Administered 2017-11-29: 40 mg via ORAL
  Filled 2017-11-29: qty 1

## 2017-11-29 MED ORDER — RANOLAZINE ER 500 MG PO TB12
1000.0000 mg | ORAL_TABLET | Freq: Two times a day (BID) | ORAL | Status: DC
  Administered 2017-11-29 (×2): 1000 mg via ORAL
  Filled 2017-11-29 (×2): qty 2

## 2017-11-29 MED ORDER — IRBESARTAN 300 MG PO TABS
300.0000 mg | ORAL_TABLET | Freq: Every day | ORAL | Status: DC
Start: 2017-11-29 — End: 2017-11-29
  Administered 2017-11-29: 300 mg via ORAL
  Filled 2017-11-29: qty 1

## 2017-11-29 MED ORDER — MORPHINE SULFATE (PF) 4 MG/ML IV SOLN: 2.0000 mg | INTRAVENOUS | Status: DC | PRN

## 2017-11-29 MED ORDER — ACETAMINOPHEN-CODEINE #3 300-30 MG PO TABS
1.0000 | ORAL_TABLET | Freq: Two times a day (BID) | ORAL | Status: DC | PRN
  Administered 2017-11-29: 1 via ORAL
  Filled 2017-11-29: qty 1

## 2017-11-29 MED ORDER — VALSARTAN-HYDROCHLOROTHIAZIDE 320-25 MG PO TABS: 1.0000 | ORAL_TABLET | Freq: Every day | ORAL | Status: DC

## 2017-11-29 MED ORDER — ISOSORBIDE MONONITRATE ER 30 MG PO TB24
120.0000 mg | ORAL_TABLET | Freq: Every day | ORAL | Status: DC
  Administered 2017-11-29: 120 mg via ORAL
  Filled 2017-11-29: qty 4

## 2017-11-29 MED ORDER — FUROSEMIDE 20 MG PO TABS
20.0000 mg | ORAL_TABLET | Freq: Two times a day (BID) | ORAL | Status: DC
  Administered 2017-11-29: 20 mg via ORAL
  Filled 2017-11-29: qty 1

## 2017-11-29 MED ORDER — TIOTROPIUM BROMIDE MONOHYDRATE 18 MCG IN CAPS
18.0000 ug | ORAL_CAPSULE | Freq: Every day | RESPIRATORY_TRACT | Status: DC
  Administered 2017-11-29: 18 ug via RESPIRATORY_TRACT
  Filled 2017-11-29: qty 5

## 2017-11-29 MED ORDER — CLOPIDOGREL BISULFATE 75 MG PO TABS
75.0000 mg | ORAL_TABLET | Freq: Every day | ORAL | Status: DC
  Administered 2017-11-29: 75 mg via ORAL
  Filled 2017-11-29: qty 1

## 2017-11-29 MED ORDER — HYDROCHLOROTHIAZIDE 25 MG PO TABS
25.0000 mg | ORAL_TABLET | Freq: Every day | ORAL | Status: DC
  Administered 2017-11-29: 25 mg via ORAL
  Filled 2017-11-29: qty 1

## 2017-11-29 MED ORDER — ALBUTEROL SULFATE (2.5 MG/3ML) 0.083% IN NEBU: 3.0000 mL | INHALATION_SOLUTION | Freq: Four times a day (QID) | RESPIRATORY_TRACT | Status: DC | PRN

## 2017-11-29 MED ORDER — ASPIRIN EC 81 MG PO TBEC
81.0000 mg | DELAYED_RELEASE_TABLET | Freq: Every day | ORAL | Status: DC
  Administered 2017-11-29: 81 mg via ORAL
  Filled 2017-11-29: qty 1

## 2017-11-29 MED ORDER — AMLODIPINE BESYLATE 5 MG PO TABS
10.0000 mg | ORAL_TABLET | Freq: Every day | ORAL | Status: DC
  Administered 2017-11-29: 10 mg via ORAL
  Filled 2017-11-29: qty 2

## 2017-11-29 MED ORDER — GUAIFENESIN-DM 100-10 MG/5ML PO SYRP
15.0000 mL | ORAL_SOLUTION | ORAL | Status: DC | PRN
  Administered 2017-11-29 (×2): 15 mL via ORAL
  Filled 2017-11-29 (×2): qty 15

## 2017-11-29 MED ORDER — HYDROCORTISONE ACE-PRAMOXINE 1-1 % RE FOAM
1.0000 | Freq: Three times a day (TID) | RECTAL | Status: DC
  Filled 2017-11-29: qty 10

## 2017-11-29 MED ORDER — PANTOPRAZOLE SODIUM 40 MG PO TBEC
40.0000 mg | DELAYED_RELEASE_TABLET | Freq: Every day | ORAL | Status: DC
  Administered 2017-11-29: 40 mg via ORAL
  Filled 2017-11-29: qty 1

## 2017-11-29 MED ORDER — OXYCODONE-ACETAMINOPHEN 5-325 MG PO TABS
1.0000 | ORAL_TABLET | ORAL | Status: DC | PRN
Start: 2017-11-29 — End: 2017-11-29
  Administered 2017-11-29: 1 via ORAL
  Filled 2017-11-29: qty 1

## 2017-11-29 NOTE — Discharge Instructions (Signed)
Chest Wall Pain °Chest wall pain is pain in or around the bones and muscles of your chest. Sometimes, an injury causes this pain. Sometimes, the cause may not be known. This pain may take several weeks or longer to get better. °Follow these instructions at home: °Pay attention to any changes in your symptoms. Take these actions to help with your pain: °· Rest as told by your doctor. °· Avoid activities that cause pain. Try not to use your chest, belly (abdominal), or side muscles to lift heavy things. °· If directed, apply ice to the painful area: °? Put ice in a plastic bag. °? Place a towel between your skin and the bag. °? Leave the ice on for 20 minutes, 2-3 times per day. °· Take over-the-counter and prescription medicines only as told by your doctor. °· Do not use tobacco products, including cigarettes, chewing tobacco, and e-cigarettes. If you need help quitting, ask your doctor. °· Keep all follow-up visits as told by your doctor. This is important. ° °Contact a doctor if: °· You have a fever. °· Your chest pain gets worse. °· You have new symptoms. °Get help right away if: °· You feel sick to your stomach (nauseous) or you throw up (vomit). °· You feel sweaty or light-headed. °· You have a cough with phlegm (sputum) or you cough up blood. °· You are short of breath. °This information is not intended to replace advice given to you by your health care provider. Make sure you discuss any questions you have with your health care provider. °Document Released: 04/26/2008 Document Revised: 04/15/2016 Document Reviewed: 02/03/2015 °Elsevier Interactive Patient Education © 2018 [employer] ° °

## 2017-11-29 NOTE — ED Notes (Signed)
Called to room. Pt states room is too warm and coughing has resumed. Inquired about more cough medicine. Advised his last dose was at 0310 therefore it is too soon right now for another dose. Pt's door opened for better air circulation and thermostat in next room adjusted to try to get his room cooler.

## 2017-11-29 NOTE — Discharge Summary (Signed)
DISCHARGE SUMMARY  Andrew West  MR#: [id number]  DOB:March 16, 1966  Date of Admission: 11/28/2017 Date of Discharge: 11/29/2017  Attending Physician:Parrish Daddario T Lyriq Finerty  Patient's VCB:SWHQ, Charlane Ferretti, MD  Consults:  [facility]   Disposition: D/C home   Follow-up Appts: Follow-up Information    Arnoldo Morale, MD Follow up in 1 week(s).   Specialty:  Family Medicine Contact information: [place] [address] [phone]        Pixie Casino, MD .   Specialty:  Cardiology Contact information: [address] [place] [address] [phone]           Discharge Diagnoses: Musculoskeletal chest wall pain  CAD DM GERD  Initial presentation: 52 y.o.malewith a history ofsevere multivessel CAD s/p multiple stents not amendable to CABG being medically managed by Dr. Debara Pickett, chronic angina despite ranexa, COPD, anxiety, HTN, and DM who presented w/ [facility]SCP with some radiation to left arm with sob.   Hospital Course: The patient was admitted to the hospital for evaluation of his chest pain.  Serial cardiac enzymes were entirely negative.  His EKG was without acute significant findings.  Cardiology was consulted and did not feel that the patient's pain was consistent with unstable angina pectoris and given his negative evaluation cleared him for discharge from a cardiology standpoint.  A d-dimer was assessed and this was found to be normal.  The patient's pain had resolved and therefore it was felt appropriate to discharge the patient home.  No changes were made in his usual medication regimen.  Allergies as of 11/29/2017      Reactions   Iohexol Anaphylaxis   PT. TO BE PREMEDICATED PRIOR TO IV CONTRAST PER DR Kris Hartmann /MMS//12/15/15Desc: PT BECAME SOB AND CHEST TIGHTNESS AFTER CONTRAST INJECTION.  [name],RT-RCT., Onset Date: [id number]      Medication List    TAKE these medications   ACCU-CHEK AVIVA PLUS w/Device Kit Use as  dircted   acetaminophen-codeine 300-30 MG tablet Commonly known as:  TYLENOL #3 Take 1 tablet every 12 (twelve) hours as needed by mouth for moderate pain.   Albuterol Sulfate 108 (90 Base) MCG/ACT Aepb Commonly known as:  PROAIR RESPICLICK Inhale 1 puff into the lungs every 6 (six) hours as needed (shortness of breath).   amLODipine 10 MG tablet Commonly known as:  NORVASC Take 1 tablet (10 mg total) daily by mouth.   aspirin 81 MG tablet Take 1 tablet (81 mg total) daily by mouth.   atorvastatin 40 MG tablet Commonly known as:  LIPITOR Take 1 tablet (40 mg total) daily by mouth.   cetirizine 10 MG tablet Commonly known as:  ZYRTEC Take 1 tablet (10 mg total) daily by mouth.   clopidogrel 75 MG tablet Commonly known as:  PLAVIX Take 1 tablet (75 mg total) daily by mouth.   FLUoxetine 20 MG capsule Commonly known as:  PROZAC TAKE 1 TABLET BY MOUTH DAILY.   fluticasone 50 MCG/ACT nasal spray Commonly known as:  FLONASE Place 2 sprays into both nostrils daily. What changed:    when to take this  reasons to take this   furosemide 20 MG tablet Commonly known as:  LASIX Take 20 mg by mouth 2 (two) times daily.   glucose blood test strip Commonly known as:  ACCU-CHEK AVIVA Use as instructed   hydrALAZINE 25 MG tablet Commonly known as:  APRESOLINE TAKE 1 TABLET BY MOUTH 2 TIMES DAILY.   hydrocortisone-pramoxine 2.5-1 % rectal cream Commonly known as:  DISCHARGE SUMMARY  Andrew West  MR#: [id number]  DOB:March 16, 1966  Date of Admission: 11/28/2017 Date of Discharge: 11/29/2017  Attending Physician:Parrish Daddario T Lyriq Finerty  Patient's VCB:SWHQ, Charlane Ferretti, MD  Consults:  [facility]   Disposition: D/C home   Follow-up Appts: Follow-up Information    Arnoldo Morale, MD Follow up in 1 week(s).   Specialty:  Family Medicine Contact information: [place] [address] [phone]        Pixie Casino, MD .   Specialty:  Cardiology Contact information: [address] [address] [phone]           Discharge Diagnoses: Musculoskeletal chest wall pain  CAD DM GERD  Initial presentation: 52 y.o.malewith a history ofsevere multivessel CAD s/p multiple stents not amendable to CABG being medically managed by Dr. Debara Pickett, chronic angina despite ranexa, COPD, anxiety, HTN, and DM who presented w/ SSCP with some radiation to left arm with sob.   Hospital Course: The patient was admitted to the hospital for evaluation of his chest pain.  Serial cardiac enzymes were entirely negative.  His EKG was without acute significant findings.  Cardiology was consulted and did not feel that the patient's pain was consistent with unstable angina pectoris and given his negative evaluation cleared him for discharge from a cardiology standpoint.  A d-dimer was assessed and this was found to be normal.  The patient's pain had resolved and therefore it was felt appropriate to discharge the patient home.  No changes were made in his usual medication regimen.  Allergies as of 11/29/2017      Reactions   Iohexol Anaphylaxis   PT. TO BE PREMEDICATED PRIOR TO IV CONTRAST PER DR Kris Hartmann /MMS//12/15/15Desc: PT BECAME SOB AND CHEST TIGHTNESS AFTER CONTRAST INJECTION.  [name],RT-RCT., Onset Date: [id number]      Medication List    TAKE these medications   ACCU-CHEK AVIVA PLUS w/Device Kit Use as  dircted   acetaminophen-codeine 300-30 MG tablet Commonly known as:  TYLENOL #3 Take 1 tablet every 12 (twelve) hours as needed by mouth for moderate pain.   Albuterol Sulfate 108 (90 Base) MCG/ACT Aepb Commonly known as:  PROAIR RESPICLICK Inhale 1 puff into the lungs every 6 (six) hours as needed (shortness of breath).   amLODipine 10 MG tablet Commonly known as:  NORVASC Take 1 tablet (10 mg total) daily by mouth.   aspirin 81 MG tablet Take 1 tablet (81 mg total) daily by mouth.   atorvastatin 40 MG tablet Commonly known as:  LIPITOR Take 1 tablet (40 mg total) daily by mouth.   cetirizine 10 MG tablet Commonly known as:  ZYRTEC Take 1 tablet (10 mg total) daily by mouth.   clopidogrel 75 MG tablet Commonly known as:  PLAVIX Take 1 tablet (75 mg total) daily by mouth.   FLUoxetine 20 MG capsule Commonly known as:  PROZAC TAKE 1 TABLET BY MOUTH DAILY.   fluticasone 50 MCG/ACT nasal spray Commonly known as:  FLONASE Place 2 sprays into both nostrils daily. What changed:    when to take this  reasons to take this   furosemide 20 MG tablet Commonly known as:  LASIX Take 20 mg by mouth 2 (two) times daily.   glucose blood test strip Commonly known as:  ACCU-CHEK AVIVA Use as instructed   hydrALAZINE 25 MG tablet Commonly known as:  APRESOLINE TAKE 1 TABLET BY MOUTH 2 TIMES DAILY.   hydrocortisone-pramoxine 2.5-1 % rectal cream Commonly known as:

## 2017-11-29 NOTE — Progress Notes (Signed)
Progress Note  Patient Name: Andrew West Date of Encounter: 11/29/2017  Primary Cardiologist: Pixie Casino, MD   Subjective   Chest pain improved to 1/10 from 8/10. Different then typical angina.   Inpatient Medications    Scheduled Meds: . amLODipine  10 mg Oral Daily  . aspirin EC  81 mg Oral Daily  . atorvastatin  40 mg Oral Daily  . budesonide  2 mL Inhalation BID  . clopidogrel  75 mg Oral Daily  . furosemide  20 mg Oral BID  . hydrALAZINE  25 mg Oral BID  . hydrochlorothiazide  25 mg Oral Daily  . hydrocortisone-pramoxine  1 applicator Rectal TID  . insulin aspart  0-9 Units Subcutaneous TID WC  . irbesartan  300 mg Oral Daily  . isosorbide mononitrate  120 mg Oral Daily  . nebivolol  20 mg Oral Daily  . pantoprazole  40 mg Oral Daily  . pregabalin  75 mg Oral TID  . ranolazine  1,000 mg Oral BID  . tiotropium  18 mcg Inhalation Daily   Continuous Infusions: . nitroGLYCERIN 20 mcg/min (11/29/17 0848)   PRN Meds: acetaminophen, acetaminophen-codeine, albuterol, gi cocktail, guaiFENesin-dextromethorphan, morphine injection, ondansetron (ZOFRAN) IV, oxyCODONE-acetaminophen   Vital Signs    Vitals:   11/29/17 0930 11/29/17 0945 11/29/17 1000 11/29/17 1015  BP: 125/90 127/89 126/86 (!) 117/93  Pulse: 65 62 82 71  Resp: 15 17 18 18   Temp:      TempSrc:      SpO2: 96% 94% 96% 95%    Intake/Output Summary (Last 24 hours) at 11/29/2017 1122 Last data filed at 11/29/2017 0507 Gross per 24 hour  Intake -  Output 400 ml  Net -400 ml   There were no vitals filed for this visit.  Telemetry    NSR at controlled rate - Personally Reviewed  ECG    None today   Physical Exam   GEN: No acute distress.   Neck: No JVD Cardiac: RRR, no murmurs, rubs, or gallops. TTP at mid sternal area.  Respiratory: Clear to auscultation bilaterally. GI: Soft, nontender, non-distended  MS: No edema; No deformity. Neuro:  Nonfocal  Psych: Normal affect   Labs      Chemistry Recent Labs  Lab 11/28/17 2031 11/29/17 0117  NA 139  --   K 3.7  --   CL 106  --   CO2 25  --   GLUCOSE 109*  --   BUN 10  --   CREATININE 0.83  --   CALCIUM 8.8*  --   PROT  --  6.4*  ALBUMIN  --  3.2*  AST  --  14*  ALT  --  13*  ALKPHOS  --  62  BILITOT  --  0.7  GFRNONAA >60  --   GFRAA >60  --   ANIONGAP 8  --      Hematology Recent Labs  Lab 11/28/17 2031  WBC 7.3  RBC 5.11  HGB 13.9  HCT 41.7  MCV 81.6  MCH 27.2  MCHC 33.3  RDW 13.7  PLT 247    Cardiac Enzymes Recent Labs  Lab 11/29/17 0117 11/29/17 0455 11/29/17 0823  TROPONINI <0.03 <0.03 <0.03    Recent Labs  Lab 11/28/17 2052  TROPIPOC 0.00     BNP Recent Labs  Lab 11/29/17 0117  BNP 50.4     DDimer No results for input(s): DDIMER in the last 168 hours.   Radiology  Dg Chest 2 View  Result Date: 11/28/2017 CLINICAL DATA:  Severe chest pain. EXAM: CHEST  2 VIEW COMPARISON:  05/09/2017 FINDINGS: Prominent central vascular structures are similar to the previous examination. Heart size is within normal limits. The trachea is midline. No focal airspace disease. No large pleural effusions. No acute bone abnormality. IMPRESSION: Prominent central vascular structures are similar to the previous examination and could be chronic. Cannot exclude mild vascular congestion. No focal lung disease. Electronically Signed   By: Markus Daft M.D.   On: 11/28/2017 21:10    Cardiac Studies   Coronary Balloon Angioplasty   12/16/2016  Left Heart Cath and Coronary Angiography     Conclusion  ____CULPRIT LESION________  Francesco Sor to 2nd Mrg lesion, 100 %stenosed. In-stent stenosis  Mid Cx-1 lesion, 100 %stenosed. In an pre-stent stenosis --> PTCA only performed Post intervention, there is a 50% residual stenosis.  Mid Cx-2 lesion, 100 %stenosed. PTCA only performed Post intervention, there is a 50% residual stenosis.  Ost LPDA to LPDA lesion, 100 %stenosed.  Ost 3rd Mrg to 3rd Mrg  lesion, 90 %stenosed.  Ost 4th Mrg to 4th Mrg lesion, 80 %stenosed.  __________________________  Mid LAD to Dist LAD lesion, 45 %stenosed.  Ramus lesion, 100 %stenosed. - In-stent stenosis  Prox RCA lesion, 100 %stenosed. - Chronic  The left ventricular systolic function is normal.  LV end diastolic pressure is normal.  There is no aortic valve stenosis. Unfortunately the patient had total occlusion of the stented segment in the circumflex that involves the dominant circumflex vessels. The stented OM 2 branch now 100% occluded.  Unfortunately, suboptimal minimally effective PTCA was performed. Unable to pass a stent to the desired location. The downstream vessels were extremely diffusely diseased and likely not good targets for bypass, however this could be considered.  Residual diffuse disease was the best that can be done with PTCA. Unable to pass stent. Very rigid lesion wiring high pressure inflation of a noncompliant balloon, but still unable to pass a stent.  PLAN:  Overnight observation and prolonged PTCA procedure.  We'll need to maximize medical management per Dr. Debara Pickett  Continued dual antiplatelet therapy      Patient Profile     52 y.o. male with a PMH of multivessel CAD s/p multiple PCIs, most recent attempt was 11/2016 but unable to pass the stents and did not have good targets for CABG, HTN, OSA, DM2, HLD and obesity presented for chest pain.   Last seen by [facility] 10/17/17. Going chest pain. On max cardiac medications. Referral made for EECP therapy with Dr. Purcell Nails at [facility].   Assessment & Plan    1. Chest pain - Atypical presentation. Different than prior angina. He previously had "chest pressure" when required stenting. Current symptoms of dully achy/sharp pain.  - His pain is reproducible with palpation (recently moved heavy furniture). Also has some pleuritic features. Worse with deep breath and cough. No fever or chills.  - He is ruled out. EKG without acute  changes. Troponin negative x 3. BNP normal. No further cardiac work up needed this admission. Referral made for EECP as above.  - Will DC iv nitro. Resume home meds.  - Treatment for pleuritic/MSK pain per primary. Dr. Percival Spanish to see later today.   For questions or updates, please contact [place] Please consult [email or web address] for contact info under Cardiology/STEMI.     SignedLeanor Kail, PA  11/29/2017, 11:22 AM    History and all data above  reviewed.  Patient examined.  I agree with the findings as above.  No longer having pain.  No SOB.  The patient exam reveals COR:RRR  ,  Lungs: Clear  ,  Abd: Positive bowel sounds, no rebound no guarding, Ext No edema  .  All available labs, radiology testing, previous records reviewed. Agree with documented assessment and plan. Chest pain:  Atypical without objective evidence of ischemia.  He has EECP arranged in [place].  Also could consider chelation therapy.  No further cardiac work up or change in cardiac meds.  Discharge per primary team.   Minus Breeding  12:42 PM  11/29/2017

## 2017-11-29 NOTE — Telephone Encounter (Signed)
Pt has been prescribed cream from another provider on 11/29/17

## 2017-11-29 NOTE — H&P (Signed)
History and Physical    Andrew West [id number] DOB: 1966/02/08 DOA: 11/28/2017  PCP: Arnoldo Morale, MD  Patient coming from:  home  Chief Complaint:  Chest pain  HPI: Andrew West is a 52 y.o. male with medical history significant of severe multivessel CAD s/p multiple stents not amendable to CABG being medically managed by dr hilty with the cardiology service, chronic angina despite ranexa, copd, anxiety, htn, dm comes in with sscp that is more severe than his chronic usual chest pain with some radiation to left arm with some sob.  No fevers.  No cough.  No le edema or swelling.  Pt reports he has chest pain almost daily.  He uses his sl ntg about 3 x a week.  That usually helps his pain but never totally relieves it.  He reports he is never chest pain free on a daily basis.  Today is was so severe he took 3 extra imdur tablets along with 3 ntg sublingual tablets and the pain was still severe.  He came to ED.  He has been placed on ntg drip and given some morphine and still reports pain of 6/10 but he says it is much more tolerable now.  He is compliant with his medications and reports he does not ever skip doses.  His pain started around mid afternoon today.  Pt is referred for admission for unstable angina.  Cardiology service has been also called for consultation in this complicated cardiac patient.  Review of Systems: As per HPI otherwise 10 point review of systems negative.   Past Medical History:  Diagnosis Date  . Anxiety   . CAD (coronary artery disease)    a. s/p multiple PCIs with last cath 11/2016 with severe multivessel CAD, s/p PCTA to LCx but unable to pass stent  . Chronic leg pain    bilateral  . Chronic lower back pain   . COPD (chronic obstructive pulmonary disease) ([place])   . Depression   . GERD (gastroesophageal reflux disease)    Takes Dexilant  . HLD (hyperlipidemia)   . Hypertension   . Rhabdomyolysis    h/o, r/t statins  . Sleep apnea    "can't  tolerate mask" (12/16/2016)  . Type II diabetes mellitus ([place])     Past Surgical History:  Procedure Laterality Date  . BACK SURGERY    . CARDIAC CATHETERIZATION N/A 09/25/2015   Procedure: Left Heart Cath and Coronary Angiography;  Surgeon: Leonie Man, MD;  Location: [facility];  Service: Cardiovascular;  Laterality: N/A;  . CARDIAC CATHETERIZATION N/A 12/16/2016   Procedure: Left Heart Cath and Coronary Angiography;  Surgeon: Leonie Man, MD;  Location: [facility];  Service: Cardiovascular;  Laterality: N/A;  . CARDIAC CATHETERIZATION N/A 12/16/2016   Procedure: Coronary Balloon Angioplasty;  Surgeon: Leonie Man, MD;  Location: [facility];  Service: Cardiovascular;  Laterality: N/A;  . COLONOSCOPY W/ POLYPECTOMY    . CORONARY ANGIOPLASTY  09/25/2015   mid cir & om  . CORONARY ANGIOPLASTY WITH STENT PLACEMENT  10/09/2001   PTCA & stenting of mid AV circumflex; 2.5x84m Pixel stent  . CORONARY ANGIOPLASTY WITH STENT PLACEMENT  12/13/2001   PCI with stent to mid L circumflex, 95% stenosis to 0% residual  . CORONARY ANGIOPLASTY WITH STENT PLACEMENT  10/10/2003   PCI to mid AV circumflex; LAD 30% disease; RCA 100% occluded prox.  . CORONARY ANGIOPLASTY WITH STENT PLACEMENT  09/01/2011   PCI with  History and Physical    Andrew West [id number] DOB: 1966/02/08 DOA: 11/28/2017  PCP: Arnoldo Morale, MD  Patient coming from:  home  Chief Complaint:  Chest pain  HPI: Andrew West is a 52 y.o. male with medical history significant of severe multivessel CAD s/p multiple stents not amendable to CABG being medically managed by dr hilty with the cardiology service, chronic angina despite ranexa, copd, anxiety, htn, dm comes in with sscp that is more severe than his chronic usual chest pain with some radiation to left arm with some sob.  No fevers.  No cough.  No le edema or swelling.  Pt reports he has chest pain almost daily.  He uses his sl ntg about 3 x a week.  That usually helps his pain but never totally relieves it.  He reports he is never chest pain free on a daily basis.  Today is was so severe he took 3 extra imdur tablets along with 3 ntg sublingual tablets and the pain was still severe.  He came to ED.  He has been placed on ntg drip and given some morphine and still reports pain of 6/10 but he says it is much more tolerable now.  He is compliant with his medications and reports he does not ever skip doses.  His pain started around mid afternoon today.  Pt is referred for admission for unstable angina.  Cardiology service has been also called for consultation in this complicated cardiac patient.  Review of Systems: As per HPI otherwise 10 point review of systems negative.   Past Medical History:  Diagnosis Date  . Anxiety   . CAD (coronary artery disease)    a. s/p multiple PCIs with last cath 11/2016 with severe multivessel CAD, s/p PCTA to LCx but unable to pass stent  . Chronic leg pain    bilateral  . Chronic lower back pain   . COPD (chronic obstructive pulmonary disease) ([place])   . Depression   . GERD (gastroesophageal reflux disease)    Takes Dexilant  . HLD (hyperlipidemia)   . Hypertension   . Rhabdomyolysis    h/o, r/t statins  . Sleep apnea    "can't  tolerate mask" (12/16/2016)  . Type II diabetes mellitus ([place])     Past Surgical History:  Procedure Laterality Date  . BACK SURGERY    . CARDIAC CATHETERIZATION N/A 09/25/2015   Procedure: Left Heart Cath and Coronary Angiography;  Surgeon: Leonie Man, MD;  Location: [facility];  Service: Cardiovascular;  Laterality: N/A;  . CARDIAC CATHETERIZATION N/A 12/16/2016   Procedure: Left Heart Cath and Coronary Angiography;  Surgeon: Leonie Man, MD;  Location: [facility];  Service: Cardiovascular;  Laterality: N/A;  . CARDIAC CATHETERIZATION N/A 12/16/2016   Procedure: Coronary Balloon Angioplasty;  Surgeon: Leonie Man, MD;  Location: [facility];  Service: Cardiovascular;  Laterality: N/A;  . COLONOSCOPY W/ POLYPECTOMY    . CORONARY ANGIOPLASTY  09/25/2015   mid cir & om  . CORONARY ANGIOPLASTY WITH STENT PLACEMENT  10/09/2001   PTCA & stenting of mid AV circumflex; 2.5x84m Pixel stent  . CORONARY ANGIOPLASTY WITH STENT PLACEMENT  12/13/2001   PCI with stent to mid L circumflex, 95% stenosis to 0% residual  . CORONARY ANGIOPLASTY WITH STENT PLACEMENT  10/10/2003   PCI to mid AV circumflex; LAD 30% disease; RCA 100% occluded prox.  . CORONARY ANGIOPLASTY WITH STENT PLACEMENT  09/01/2011   PCI with  History and Physical    Andrew West [id number] DOB: 1966/02/08 DOA: 11/28/2017  PCP: Arnoldo Morale, MD  Patient coming from:  home  Chief Complaint:  Chest pain  HPI: Andrew West is a 52 y.o. male with medical history significant of severe multivessel CAD s/p multiple stents not amendable to CABG being medically managed by dr hilty with the cardiology service, chronic angina despite ranexa, copd, anxiety, htn, dm comes in with sscp that is more severe than his chronic usual chest pain with some radiation to left arm with some sob.  No fevers.  No cough.  No le edema or swelling.  Pt reports he has chest pain almost daily.  He uses his sl ntg about 3 x a week.  That usually helps his pain but never totally relieves it.  He reports he is never chest pain free on a daily basis.  Today is was so severe he took 3 extra imdur tablets along with 3 ntg sublingual tablets and the pain was still severe.  He came to ED.  He has been placed on ntg drip and given some morphine and still reports pain of 6/10 but he says it is much more tolerable now.  He is compliant with his medications and reports he does not ever skip doses.  His pain started around mid afternoon today.  Pt is referred for admission for unstable angina.  Cardiology service has been also called for consultation in this complicated cardiac patient.  Review of Systems: As per HPI otherwise 10 point review of systems negative.   Past Medical History:  Diagnosis Date  . Anxiety   . CAD (coronary artery disease)    a. s/p multiple PCIs with last cath 11/2016 with severe multivessel CAD, s/p PCTA to LCx but unable to pass stent  . Chronic leg pain    bilateral  . Chronic lower back pain   . COPD (chronic obstructive pulmonary disease) ([place])   . Depression   . GERD (gastroesophageal reflux disease)    Takes Dexilant  . HLD (hyperlipidemia)   . Hypertension   . Rhabdomyolysis    h/o, r/t statins  . Sleep apnea    "can't  tolerate mask" (12/16/2016)  . Type II diabetes mellitus ([place])     Past Surgical History:  Procedure Laterality Date  . BACK SURGERY    . CARDIAC CATHETERIZATION N/A 09/25/2015   Procedure: Left Heart Cath and Coronary Angiography;  Surgeon: Leonie Man, MD;  Location: [facility];  Service: Cardiovascular;  Laterality: N/A;  . CARDIAC CATHETERIZATION N/A 12/16/2016   Procedure: Left Heart Cath and Coronary Angiography;  Surgeon: Leonie Man, MD;  Location: [facility];  Service: Cardiovascular;  Laterality: N/A;  . CARDIAC CATHETERIZATION N/A 12/16/2016   Procedure: Coronary Balloon Angioplasty;  Surgeon: Leonie Man, MD;  Location: [facility];  Service: Cardiovascular;  Laterality: N/A;  . COLONOSCOPY W/ POLYPECTOMY    . CORONARY ANGIOPLASTY  09/25/2015   mid cir & om  . CORONARY ANGIOPLASTY WITH STENT PLACEMENT  10/09/2001   PTCA & stenting of mid AV circumflex; 2.5x84m Pixel stent  . CORONARY ANGIOPLASTY WITH STENT PLACEMENT  12/13/2001   PCI with stent to mid L circumflex, 95% stenosis to 0% residual  . CORONARY ANGIOPLASTY WITH STENT PLACEMENT  10/10/2003   PCI to mid AV circumflex; LAD 30% disease; RCA 100% occluded prox.  . CORONARY ANGIOPLASTY WITH STENT PLACEMENT  09/01/2011   PCI with  History and Physical    Andrew West [id number] DOB: 1966/02/08 DOA: 11/28/2017  PCP: Arnoldo Morale, MD  Patient coming from:  home  Chief Complaint:  Chest pain  HPI: Andrew West is a 52 y.o. male with medical history significant of severe multivessel CAD s/p multiple stents not amendable to CABG being medically managed by dr hilty with the cardiology service, chronic angina despite ranexa, copd, anxiety, htn, dm comes in with sscp that is more severe than his chronic usual chest pain with some radiation to left arm with some sob.  No fevers.  No cough.  No le edema or swelling.  Pt reports he has chest pain almost daily.  He uses his sl ntg about 3 x a week.  That usually helps his pain but never totally relieves it.  He reports he is never chest pain free on a daily basis.  Today is was so severe he took 3 extra imdur tablets along with 3 ntg sublingual tablets and the pain was still severe.  He came to ED.  He has been placed on ntg drip and given some morphine and still reports pain of 6/10 but he says it is much more tolerable now.  He is compliant with his medications and reports he does not ever skip doses.  His pain started around mid afternoon today.  Pt is referred for admission for unstable angina.  Cardiology service has been also called for consultation in this complicated cardiac patient.  Review of Systems: As per HPI otherwise 10 point review of systems negative.   Past Medical History:  Diagnosis Date  . Anxiety   . CAD (coronary artery disease)    a. s/p multiple PCIs with last cath 11/2016 with severe multivessel CAD, s/p PCTA to LCx but unable to pass stent  . Chronic leg pain    bilateral  . Chronic lower back pain   . COPD (chronic obstructive pulmonary disease) ([place])   . Depression   . GERD (gastroesophageal reflux disease)    Takes Dexilant  . HLD (hyperlipidemia)   . Hypertension   . Rhabdomyolysis    h/o, r/t statins  . Sleep apnea    "can't  tolerate mask" (12/16/2016)  . Type II diabetes mellitus ([place])     Past Surgical History:  Procedure Laterality Date  . BACK SURGERY    . CARDIAC CATHETERIZATION N/A 09/25/2015   Procedure: Left Heart Cath and Coronary Angiography;  Surgeon: Leonie Man, MD;  Location: [facility];  Service: Cardiovascular;  Laterality: N/A;  . CARDIAC CATHETERIZATION N/A 12/16/2016   Procedure: Left Heart Cath and Coronary Angiography;  Surgeon: Leonie Man, MD;  Location: [facility];  Service: Cardiovascular;  Laterality: N/A;  . CARDIAC CATHETERIZATION N/A 12/16/2016   Procedure: Coronary Balloon Angioplasty;  Surgeon: Leonie Man, MD;  Location: [facility];  Service: Cardiovascular;  Laterality: N/A;  . COLONOSCOPY W/ POLYPECTOMY    . CORONARY ANGIOPLASTY  09/25/2015   mid cir & om  . CORONARY ANGIOPLASTY WITH STENT PLACEMENT  10/09/2001   PTCA & stenting of mid AV circumflex; 2.5x84m Pixel stent  . CORONARY ANGIOPLASTY WITH STENT PLACEMENT  12/13/2001   PCI with stent to mid L circumflex, 95% stenosis to 0% residual  . CORONARY ANGIOPLASTY WITH STENT PLACEMENT  10/10/2003   PCI to mid AV circumflex; LAD 30% disease; RCA 100% occluded prox.  . CORONARY ANGIOPLASTY WITH STENT PLACEMENT  09/01/2011   PCI with  cardiac   CAD S/P multiple PCI's- as above   Diastolic dysfunction-grade 2 with EF 60-65% Oct 2016- cont home lasix dosing    Anxiety- noted   Refractory angina (HCC)- per cards team, primary cardiologist is dr hilty     DVT prophylaxis:  scd Code Status:  full Family Communication:  none Disposition Plan:  Per day team Consults called:  cardiology Admission status:  observation   Malayia Spizzirri A MD [facility]  If 7PM-7AM, please contact night-coverage [email or web address] Password [email or web address]  11/29/2017, 12:07 AM

## 2017-11-29 NOTE — ED Notes (Signed)
Pt CBG was 101, notified Brooke(RN)

## 2017-11-29 NOTE — ED Notes (Signed)
Pt states cp is almost resolved. Complaining of headache. Pt verbalized understanding of discharge instructions.

## 2017-11-29 NOTE — ED Notes (Signed)
Ordered tray 

## 2017-11-29 NOTE — ED Notes (Signed)
Provided patient with hospital bed.

## 2017-11-29 NOTE — ED Notes (Signed)
MD at bedside. 

## 2017-11-29 NOTE — ED Notes (Signed)
Pt ambulated to restroom independently.

## 2017-11-30 ENCOUNTER — Other Ambulatory Visit: Payer: Self-pay | Admitting: Family Medicine

## 2017-11-30 MED ORDER — LOSARTAN POTASSIUM-HCTZ 100-25 MG PO TABS: 1.0000 | ORAL_TABLET | Freq: Every day | ORAL | 1 refills | Status: DC

## 2017-12-07 ENCOUNTER — Telehealth: Payer: Self-pay | Admitting: Family Medicine

## 2017-12-07 NOTE — Telephone Encounter (Addendum)
He was referred to [facility] pain management and will need to obtain pain prescriptions from them going forward. Please provide him with their number; see referral notes. Thank you.

## 2017-12-07 NOTE — Telephone Encounter (Signed)
Pt was called and informed to contact pain management for medication refill.

## 2017-12-07 NOTE — Telephone Encounter (Signed)
Pt called to request a refill on  -acetaminophen-codeine (TYLENOL #3) 300-30 MG tablet  Please follow up

## 2018-01-03 ENCOUNTER — Telehealth: Payer: Self-pay | Admitting: Family Medicine

## 2018-01-03 NOTE — Telephone Encounter (Signed)
Pt  Called since he been try to reach the nurse or the PCP since he having back issued and need your help, please follow up

## 2018-01-05 ENCOUNTER — Ambulatory Visit: Payer: Medicaid Other | Admitting: Internal Medicine

## 2018-01-05 ENCOUNTER — Telehealth: Payer: Self-pay

## 2018-01-05 DIAGNOSIS — K6289 Other specified diseases of anus and rectum: Secondary | ICD-10-CM

## 2018-01-05 DIAGNOSIS — Z1211 Encounter for screening for malignant neoplasm of colon: Secondary | ICD-10-CM

## 2018-01-06 NOTE — Telephone Encounter (Signed)
Patient was called and patient states that he is having severe anal pain. Patient states that he has experiened this pain before and would like to get a colonoscopy done to see if he has polyps.

## 2018-01-07 NOTE — Telephone Encounter (Signed)
Referral has been placed. 

## 2018-01-09 NOTE — Telephone Encounter (Signed)
He received a 90-day supply with 1 refill on 10/10/18 and will need to call the pharmacy for his refills.

## 2018-01-09 NOTE — Telephone Encounter (Signed)
Patient was called and patient states that medication did not have 90 day supply. I spoke with pharmacy and patient never picked up the refills that were sent in on 10/10/18. Patient did not understand and hung up phone.

## 2018-01-09 NOTE — Telephone Encounter (Signed)
Patient was called and informed of referral being placed. Patient states that he needs refills on Albuterol Sulfate (PROAIR RESPICLICK) 614 (90 Base) MCG/ACT AEPB [[id number]]    losartan-hydrochlorothiazide (HYZAAR) 100-25 MG tablet ,   metFORMIN (GLUCOPHAGE) 1000 MG tablet,   mometasone (ASMANEX) 220 MCG/INH inhaler ,   atorvastatin (LIPITOR) 40 MG tablet ,   clopidogrel (PLAVIX) 75 MG tablet

## 2018-01-09 NOTE — Telephone Encounter (Signed)
Patient was called .

## 2018-01-10 ENCOUNTER — Telehealth: Payer: Self-pay | Admitting: Internal Medicine

## 2018-01-10 ENCOUNTER — Encounter: Payer: Self-pay | Admitting: Internal Medicine

## 2018-01-10 NOTE — Telephone Encounter (Signed)
New message     Patient states he has a personal matter to only discuss with nurse. Please call

## 2018-01-10 NOTE — Telephone Encounter (Signed)
Returned call to patient he stated he would like to be referred to a new PCP.Stated he is not happy with his care.Advised our scheduler will call back with appointment.

## 2018-01-16 MED FILL — traZODone HCL 50 MG TABS: 50 | 30 days supply | Qty: 30 | Fill #0

## 2018-01-16 MED FILL — hydrALAZINE HCL 25 MG TABS: 25 | 30 days supply | Qty: 60 | Fill #1

## 2018-01-16 MED FILL — PANTOPRAZOLE SOD DR 40 MG T: 40 | 30 days supply | Qty: 30 | Fill #4

## 2018-01-16 MED FILL — SPIRIVA 18 MCG CP-HANDIHALE: 18 | 30 days supply | Qty: 30 | Fill #3

## 2018-01-16 MED FILL — CLOPIDOGREL 75 MG TABLET: 75 | 30 days supply | Qty: 30 | Fill #4

## 2018-01-16 MED FILL — RANEXA ER 1,000 MG TABLET: 1000 | 30 days supply | Qty: 60 | Fill #3

## 2018-01-16 MED FILL — AMLODIPINE BESYLATE 10 MG T: 10 | 30 days supply | Qty: 30 | Fill #1

## 2018-01-16 MED FILL — FLUoxetine HCL 20 MG CAPS: 20 | 30 days supply | Qty: 30 | Fill #1

## 2018-01-17 ENCOUNTER — Emergency Department (HOSPITAL_COMMUNITY)
Admission: EM | Admit: 2018-01-17 | Discharge: 2018-01-17 | Disposition: A | Discharge status: Home / Self Care | Payer: Medicaid Other | Attending: Emergency Medicine | Admitting: Emergency Medicine

## 2018-01-17 ENCOUNTER — Emergency Department (HOSPITAL_COMMUNITY): Payer: Medicaid Other

## 2018-01-17 ENCOUNTER — Other Ambulatory Visit: Payer: Self-pay

## 2018-01-17 ENCOUNTER — Encounter (HOSPITAL_COMMUNITY): Payer: Self-pay | Admitting: Emergency Medicine

## 2018-01-17 DIAGNOSIS — Z7982 Long term (current) use of aspirin: Secondary | ICD-10-CM | POA: Insufficient documentation

## 2018-01-17 DIAGNOSIS — E119 Type 2 diabetes mellitus without complications: Secondary | ICD-10-CM | POA: Diagnosis not present

## 2018-01-17 DIAGNOSIS — Z7902 Long term (current) use of antithrombotics/antiplatelets: Secondary | ICD-10-CM | POA: Diagnosis not present

## 2018-01-17 DIAGNOSIS — J449 Chronic obstructive pulmonary disease, unspecified: Secondary | ICD-10-CM | POA: Insufficient documentation

## 2018-01-17 DIAGNOSIS — I5030 Unspecified diastolic (congestive) heart failure: Secondary | ICD-10-CM | POA: Insufficient documentation

## 2018-01-17 DIAGNOSIS — I110 Hypertensive heart disease with heart failure: Secondary | ICD-10-CM | POA: Diagnosis not present

## 2018-01-17 DIAGNOSIS — R05 Cough: Secondary | ICD-10-CM | POA: Diagnosis present

## 2018-01-17 DIAGNOSIS — I2510 Atherosclerotic heart disease of native coronary artery without angina pectoris: Secondary | ICD-10-CM | POA: Insufficient documentation

## 2018-01-17 DIAGNOSIS — Z7984 Long term (current) use of oral hypoglycemic drugs: Secondary | ICD-10-CM | POA: Insufficient documentation

## 2018-01-17 DIAGNOSIS — J40 Bronchitis, not specified as acute or chronic: Secondary | ICD-10-CM

## 2018-01-17 DIAGNOSIS — Z87891 Personal history of nicotine dependence: Secondary | ICD-10-CM | POA: Diagnosis not present

## 2018-01-17 DIAGNOSIS — R059 Cough, unspecified: Secondary | ICD-10-CM

## 2018-01-17 LAB — COMPREHENSIVE METABOLIC PANEL
ALBUMIN: 3.6 g/dL (ref 3.5–5.0)
ALK PHOS: 66 U/L (ref 38–126)
ALT: 17 U/L (ref 17–63)
AST: 27 U/L (ref 15–41)
Anion gap: 12 (ref 5–15)
BUN: 8 mg/dL (ref 6–20)
CALCIUM: 8.4 mg/dL — AB (ref 8.9–10.3)
CO2: 23 mmol/L (ref 22–32)
CREATININE: 0.88 mg/dL (ref 0.61–1.24)
Chloride: 100 mmol/L — ABNORMAL LOW (ref 101–111)
GFR calc Af Amer: >60 mL/min (ref >60)
GFR calc non Af Amer: >60 mL/min (ref >60)
Glucose, Bld: 96 mg/dL (ref 65–99)
Potassium: 3.1 mmol/L — ABNORMAL LOW (ref 3.5–5.1)
SODIUM: 135 mmol/L (ref 135–145)
Total Bilirubin: 0.8 mg/dL (ref 0.3–1.2)
Total Protein: 7.2 g/dL (ref 6.5–8.1)

## 2018-01-17 LAB — CBC WITH DIFFERENTIAL/PLATELET
BASOS ABS: 0 10*3/uL (ref 0.0–0.1)
Basophils Relative: 0 %
EOS ABS: 0.2 10*3/uL (ref 0.0–0.7)
Eosinophils Relative: 2 %
HCT: 42.6 % (ref 39.0–52.0)
HEMOGLOBIN: 13.9 g/dL (ref 13.0–17.0)
LYMPHS PCT: 41 %
Lymphs Abs: 4.1 10*3/uL — ABNORMAL HIGH (ref 0.7–4.0)
MCH: 26.7 pg (ref 26.0–34.0)
MCHC: 32.6 g/dL (ref 30.0–36.0)
MCV: 81.9 fL (ref 78.0–100.0)
MONOS PCT: 8 %
Monocytes Absolute: 0.8 10*3/uL (ref 0.1–1.0)
Neutro Abs: 4.8 10*3/uL (ref 1.7–7.7)
Neutrophils Relative %: 49 %
Platelets: 219 10*3/uL (ref 150–400)
RBC: 5.2 MIL/uL (ref 4.22–5.81)
RDW: 14.4 % (ref 11.5–15.5)
WBC: 9.9 10*3/uL (ref 4.0–10.5)

## 2018-01-17 MED ORDER — BENZONATATE 100 MG PO CAPS: 100.0000 mg | ORAL_CAPSULE | Freq: Three times a day (TID) | ORAL | 0 refills | Status: DC

## 2018-01-17 MED ORDER — DOXYCYCLINE HYCLATE 100 MG PO TABS
100.0000 mg | ORAL_TABLET | Freq: Once | ORAL | Status: AC
  Administered 2018-01-17: 100 mg via ORAL
  Filled 2018-01-17: qty 1

## 2018-01-17 MED ORDER — BENZONATATE 100 MG PO CAPS
100.0000 mg | ORAL_CAPSULE | Freq: Once | ORAL | Status: AC
  Administered 2018-01-17: 100 mg via ORAL
  Filled 2018-01-17: qty 1

## 2018-01-17 MED ORDER — DOXYCYCLINE HYCLATE 100 MG PO CAPS: 100.0000 mg | ORAL_CAPSULE | Freq: Two times a day (BID) | ORAL | 0 refills | Status: DC

## 2018-01-17 MED FILL — VALSARTAN-HCTZ 320-25 MG TA: 320-25 | 30 days supply | Qty: 30 | Fill #0

## 2018-01-17 NOTE — Discharge Instructions (Signed)
Take the prescribed medication as directed.  Use your inhalers at home. Follow-up with your primary care doctor. Return to the ED for new or worsening symptoms.

## 2018-01-17 NOTE — ED Provider Notes (Signed)
[facility] EMERGENCY DEPARTMENT Provider Note   CSN: [id number] Arrival date & time: 01/17/18  2027     History   Chief Complaint Chief Complaint  Patient presents with  . Generalized Body Aches    HPI Andrew West is a 52 y.o. male.  The history is provided by the patient and medical records.    52 y.o. M with hx of CAD, anxiety, chronic back pain, COPD, depression, HTN, HLP, DM2, sleep apnea, presenting to the ED for productive cough and generalized body aches with fatigue for the past 4 days.  States cough is very wet, worse at night.  Unsure color of sputum but states it feels "thick" in his throat.  States he only feels short of breath when he gets into a coughing fit.  He does have inhalers that he uses regularly, they have given him some relief.  He is unsure of any definite sick contacts.  He did get a flu vaccine this past season.  He denies any nausea, vomiting, diarrhea, chest pain.  He has been able to eat and drink regularly at home.  Past Medical History:  Diagnosis Date  . Anxiety   . CAD (coronary artery disease)    a. s/p multiple PCIs with last cath 11/2016 with severe multivessel CAD, s/p PCTA to LCx but unable to pass stent  . Chronic leg pain    bilateral  . Chronic lower back pain   . COPD (chronic obstructive pulmonary disease) ([place])   . Depression   . GERD (gastroesophageal reflux disease)    Takes Dexilant  . HLD (hyperlipidemia)   . Hypertension   . Rhabdomyolysis    h/o, r/t statins  . Sleep apnea    "can't tolerate mask" (12/16/2016)  . Type II diabetes mellitus [facility])     Patient Active Problem List   Diagnosis Date Noted  . Benign prostatic hyperplasia with urinary hesitancy 11/17/2017  . Preoperative cardiovascular examination 07/12/2017  . Refractory angina ([place]) 05/24/2017  . Complex regional pain syndrome type I 03/24/2017  . Progressive angina ([place]) -Class III 12/16/2016  . Anxiety 06/28/2016  . Diastolic  dysfunction-grade 2 with EF 60-65% Oct 2016 03/22/2016  . Hypertension 10/03/2015  . CAD S/P multiple PCI's 10/03/2015  . Pulmonary nodule 06/24/2015  . Cough 06/24/2015  . COPD with asthma ([place]) 06/24/2015  . Reactive airway disease 06/04/2015  . DOE (dyspnea on exertion) 04/15/2015  . Lumbar disc herniation with radiculopathy 03/31/2012  . Presence of stent in left circumflex coronary artery 10/18/2011    Class: History of  . TOBACCO ABUSE 02/27/2009  . ABDOMINAL PAIN, LEFT LOWER QUADRANT 12/30/2008  . ABDOMINAL PAIN, EPIGASTRIC 12/05/2008  . Depression 09/05/2008  . Hereditary and idiopathic peripheral neuropathy 09/05/2008  . GERD 09/05/2008  . Cervical disc disorder with radiculopathy of cervical region 08/19/2008  . HLD (hyperlipidemia) 04/25/2008    Class: Diagnosis of  . ALLERGIC RHINITIS 04/25/2008  . Backache 04/25/2008  . Chest pain, unspecified 04/25/2008  . COLONIC POLYPS, HX OF 04/25/2008  . Obesity 04/18/2008    Class: Diagnosis of  . ANAL FISSURE, HX OF 04/18/2008  . Diabetes mellitus ([place]) 01/30/2008    Class: History of  . RECTAL BLEEDING 01/30/2008    Past Surgical History:  Procedure Laterality Date  . BACK SURGERY    . CARDIAC CATHETERIZATION N/A 09/25/2015   Procedure: Left Heart Cath and Coronary Angiography;  Surgeon: Leonie Man, MD;  Location: [place] CV LAB;  Service:  Cardiovascular;  Laterality: N/A;  . CARDIAC CATHETERIZATION N/A 12/16/2016   Procedure: Left Heart Cath and Coronary Angiography;  Surgeon: Leonie Man, MD;  Location: [place] CV LAB;  Service: Cardiovascular;  Laterality: N/A;  . CARDIAC CATHETERIZATION N/A 12/16/2016   Procedure: Coronary Balloon Angioplasty;  Surgeon: Leonie Man, MD;  Location: [place] CV LAB;  Service: Cardiovascular;  Laterality: N/A;  . COLONOSCOPY W/ POLYPECTOMY    . CORONARY ANGIOPLASTY  09/25/2015   mid cir & om  . CORONARY ANGIOPLASTY WITH STENT PLACEMENT  10/09/2001   PTCA &  stenting of mid AV circumflex; 2.5x56m Pixel stent  . CORONARY ANGIOPLASTY WITH STENT PLACEMENT  12/13/2001   PCI with stent to mid L circumflex, 95% stenosis to 0% residual  . CORONARY ANGIOPLASTY WITH STENT PLACEMENT  10/10/2003   PCI to mid AV circumflex; LAD 30% disease; RCA 100% occluded prox.  . CORONARY ANGIOPLASTY WITH STENT PLACEMENT  09/01/2011   PCI with stenting with bare metal stent to mid AV groove circumflex and PDA  . CORONARY ANGIOPLASTY WITH STENT PLACEMENT  10/17/2011   cutting balloon angioplasty of ostial lateral OM1 branch and bifurcation AV groove circumflex OM junction; stenosis reduced to 0%  . EXCISIONAL HEMORRHOIDECTOMY    . LEFT HEART CATHETERIZATION WITH CORONARY ANGIOGRAM N/A 10/18/2011   Procedure: LEFT HEART CATHETERIZATION WITH CORONARY ANGIOGRAM;  Surgeon: DLeonie Man MD;  [facility];  Service: Cardiovascular;  Laterality: N/A;  . LUMBAR LAMINECTOMY/DECOMPRESSION MICRODISCECTOMY  03/31/2012   Procedure: LUMBAR LAMINECTOMY/DECOMPRESSION MICRODISCECTOMY 1 LEVEL;  Surgeon: HCharlie Pitter MD;  [facility];  Service: Neurosurgery;  Laterality: Left;  . TRANSTHORACIC ECHOCARDIOGRAM  07/28/2011   EF 55-65%; LVH, grade 1 diastolic dysfunction;        Home Medications    Prior to Admission medications   Medication Sig Start Date End Date Taking? Authorizing Provider  acetaminophen-codeine (TYLENOL #3) 300-30 MG tablet Take 1 tablet every 12 (twelve) hours as needed by mouth for moderate pain. 10/10/17   NCharlott Rakes MD  Albuterol Sulfate (PROAIR RESPICLICK) 1283(90 Base) MCG/ACT AEPB Inhale 1 puff into the lungs every 6 (six) hours as needed (shortness of breath). 04/08/17   NCharlott Rakes MD  amLODipine (NORVASC) 10 MG tablet Take 1 tablet (10 mg total) daily by mouth. 10/10/17   NCharlott Rakes MD  aspirin 81 MG tablet Take 1 tablet (81 mg total) daily by mouth. 10/10/17   NCharlott Rakes MD  atorvastatin (LIPITOR) 40 MG tablet  Take 1 tablet (40 mg total) daily by mouth. 10/10/17   NCharlott Rakes MD  Blood Glucose Monitoring Suppl (ACCU-CHEK AVIVA PLUS) w/Device KIT Use as dircted 07/12/17   NCharlott Rakes MD  cetirizine (ZYRTEC) 10 MG tablet Take 1 tablet (10 mg total) daily by mouth. 10/10/17   NCharlott Rakes MD  clopidogrel (PLAVIX) 75 MG tablet Take 1 tablet (75 mg total) daily by mouth. 10/10/17   Newlin, Enobong, MD  FLUoxetine (PROZAC) 20 MG capsule TAKE 1 TABLET BY MOUTH DAILY. 10/10/17   NCharlott Rakes MD  fluticasone (FLONASE) 50 MCG/ACT nasal spray Place 2 sprays into both nostrils daily. Patient taking differently: Place 2 sprays into both nostrils daily as needed for allergies.  02/24/17   MArgentina Donovan PA-C  furosemide (LASIX) 20 MG tablet Take 20 mg by mouth 2 (two) times daily.    [provider]  glucose blood (ACCU-CHEK AVIVA) test strip Use as instructed 07/12/17   NCharlott Rakes MD  hydrALAZINE (  APRESOLINE) 25 MG tablet TAKE 1 TABLET BY MOUTH 2 TIMES DAILY. 11/28/17   Hilty, Nadean Corwin, MD  hydrocortisone-pramoxine [facility]) 2.5-1 % rectal cream PLACE 1 APPLICATION RECTALLY 3 (THREE) TIMES DAILY. 08/25/17   [name], MD  isosorbide mononitrate (IMDUR) 120 MG 24 hr tablet Take 1 tablet (120 mg total) daily by mouth. 10/10/17   [name], MD  losartan-hydrochlorothiazide (HYZAAR) 100-25 MG tablet Take 1 tablet by mouth daily. 11/30/17   [name], MD  metFORMIN (GLUCOPHAGE) 1000 MG tablet Take 1 tablet (1,000 mg total) 2 (two) times daily with a meal by mouth. 10/10/17   Newlin, Enobong, MD  mometasone (ASMANEX) 220 MCG/INH inhaler Inhale 2 puffs daily into the lungs. 10/10/17   [name], MD  Nebivolol HCl 20 MG TABS Take 1 tablet (20 mg total) by mouth daily. 11/08/16   Hilty, Nadean Corwin, MD  nitroGLYCERIN (NITROSTAT) 0.4 MG SL tablet Place 1 tablet (0.4 mg total) under the tongue every 5 (five) minutes as needed for chest pain. 12/24/16   Ahmed Prima, Fransisco Hertz, PA-C   oxyCODONE-acetaminophen (PERCOCET/ROXICET) 5-325 MG tablet Take 1-2 tablets by mouth every 4 (four) hours as needed for severe pain. 06/06/17   Charlann Lange, PA-C  pantoprazole (PROTONIX) 40 MG tablet Take 1 tablet (40 mg total) daily by mouth. 10/10/17   [name], MD  pregabalin (LYRICA) 75 MG capsule Take 1 capsule (75 mg total) by mouth 3 (three) times daily. 07/12/17   Hilty, Nadean Corwin, MD  ranolazine (RANEXA) 1000 MG SR tablet Take 1 tablet (1,000 mg total) by mouth 2 (two) times daily. 05/24/17   Hilty, Nadean Corwin, MD  tamsulosin (FLOMAX) 0.4 MG CAPS capsule Take 1 capsule (0.4 mg total) daily by mouth. Patient taking differently: Take 0.4 mg by mouth 2 (two) times daily.  10/10/17   [name], MD  tiotropium (SPIRIVA HANDIHALER) 18 MCG inhalation capsule Place 1 capsule (18 mcg total) daily into inhaler and inhale. 10/10/17   [name], MD  traZODone (DESYREL) 50 MG tablet Take 1 tablet (50 mg total) at bedtime as needed by mouth. for sleep 10/10/17   [name], MD    Family History Family History  Problem Relation Age of Onset  . Heart attack Father   . Hypertension Mother   . Diabetes Mother   . Heart disease Brother        x 3   . Heart attack Brother        deceased  . Hypertension Sister   . Diabetes Sister   . Anesthesia problems Neg Hx   . Hypotension Neg Hx   . Malignant hyperthermia Neg Hx   . Pseudochol deficiency Neg Hx     Social History Social History   Tobacco Use  . Smoking status: Former Smoker    Packs/day: 0.25    Years: 25.00    Pack years: 6.25    Types: Cigarettes    Last attempt to quit: 05/24/2015    Years since quitting: 2.6  . Smokeless tobacco: Never Used  Substance Use Topics  . Alcohol use: No    Alcohol/week: 0.0 oz  . Drug use: No     Allergies   Iohexol   Review of Systems Review of Systems  Respiratory: Positive for cough.   Musculoskeletal: Positive for myalgias.  All other systems reviewed and are  negative.    Physical Exam Updated Vital Signs BP (!) 131/95 (BP Location: Right Arm)   Pulse 83   Temp 98.8 F (  37.1 C) (Oral)   Resp 18   Ht _0  (1.727 m)   Wt 106.6 kg (235 lb)   SpO2 97%   BMI 35.73 kg/m   Physical Exam  Constitutional: He is oriented to person, place, and time. He appears well-developed and well-nourished.  HENT:  Head: Normocephalic and atraumatic.  Mouth/Throat: Oropharynx is clear and moist.  Eyes: Conjunctivae and EOM are normal. Pupils are equal, round, and reactive to light.  Neck: Normal range of motion.  Cardiovascular: Normal rate, regular rhythm and normal heart sounds.  Pulmonary/Chest: Effort normal and breath sounds normal. No stridor. No respiratory distress. He has no wheezes. He has no rhonchi.  Abdominal: Soft. Bowel sounds are normal. There is no tenderness. There is no rebound.  Musculoskeletal: Normal range of motion.  Neurological: He is alert and oriented to person, place, and time.  Skin: Skin is warm and dry.  Psychiatric: He has a normal mood and affect.  Nursing note and vitals reviewed.    ED Treatments / Results  Labs (all labs ordered are listed, but only abnormal results are displayed) Labs Reviewed  CBC WITH DIFFERENTIAL/PLATELET - Abnormal; Notable for the following components:      Result Value   Lymphs Abs 4.1 (*)    All other components within normal limits  COMPREHENSIVE METABOLIC PANEL - Abnormal; Notable for the following components:   Potassium 3.1 (*)    Chloride 100 (*)    Calcium 8.4 (*)    All other components within normal limits    EKG  EKG Interpretation None       Radiology Dg Chest 2 View  Result Date: 01/17/2018 CLINICAL DATA:  Productive cough EXAM: CHEST  2 VIEW COMPARISON:  11/28/2017 FINDINGS: Heart is normal size. Mild peribronchial thickening and interstitial prominence. Left base atelectasis. No effusions. No acute bony abnormality. IMPRESSION: Bronchitic changes.  Left base  atelectasis. Electronically Signed   By: Rolm Baptise M.D.   On: 01/17/2018 22:04    Procedures Procedures (including critical care time)  Medications Ordered in ED Medications - No data to display   Initial Impression / Assessment and Plan / ED Course  I have reviewed the triage vital signs and the nursing notes.  Pertinent labs & imaging results that were available during my care of the patient were reviewed by me and considered in my medical decision making (see chart for details).  52 year old male here with generalized body aches, cough, and fatigue over the past 4 days.  He is afebrile and nontoxic in appearance here.  Lungs are clear without any significant wheezes or rhonchi.  He is in no acute respiratory distress.  Vitals are stable.  He had screening labs and chest x-ray obtained from the waiting room, overall reassuring but chest x-ray does reveal some bronchitic changes which correlates clinically.  Patient does have multiple risk factors for worsening infection including COPD so feel it would be prudent to cover with antibiotics.  Patient states he never has any added benefit from steroids as it just seems to increase his blood sugar so we will hold off on these at this time.  Encouraged rest, oral hydration, continue using home inhalers.  Close follow-up with PCP.  Discussed plan with patient, he acknowledged understanding and agreed with plan of care.  Return precautions given for new or worsening symptoms.  Final Clinical Impressions(s) / ED Diagnoses   Final diagnoses:  Bronchitis  Cough    ED Discharge Orders  Ordered    doxycycline (VIBRAMYCIN) 100 MG capsule  2 times daily     01/17/18 2330    benzonatate (TESSALON) 100 MG capsule  Every 8 hours     01/17/18 2330       Larene Pickett, PA-C 01/18/18 0133    Deno Etienne, DO 01/18/18 [phone]

## 2018-01-17 NOTE — ED Triage Notes (Signed)
Pt to ED with c/o generalized body aches and productive cough x's 4 days.

## 2018-01-19 ENCOUNTER — Ambulatory Visit: Payer: Medicaid Other | Admitting: Physician Assistant

## 2018-01-26 ENCOUNTER — Telehealth: Payer: Self-pay | Admitting: Internal Medicine

## 2018-01-26 DIAGNOSIS — I10 Essential (primary) hypertension: Secondary | ICD-10-CM

## 2018-01-26 MED ORDER — NEBIVOLOL HCL 20 MG PO TABS: 20.0000 mg | ORAL_TABLET | Freq: Every day | ORAL | 3 refills | Status: DC

## 2018-01-26 NOTE — Telephone Encounter (Signed)
New Message   *STAT* If patient is at the pharmacy, call can be transferred to refill team.   1. Which medications need to be refilled? (please list name of each medication and dose if known) beta blocker  2. Which pharmacy/location (including street and city if local pharmacy) is medication to be sent to? [facility] on [address]  3. Do they need a 30 day or 90 day supply?

## 2018-02-01 ENCOUNTER — Ambulatory Visit: Payer: Medicaid Other | Admitting: Physician Assistant

## 2018-02-06 ENCOUNTER — Telehealth: Payer: Self-pay | Admitting: Internal Medicine

## 2018-02-06 NOTE — Telephone Encounter (Signed)
I don't think another cath will be of benefit. Was he ever seen at [facility] - I referred him there for EECP therapy.  Dr. Lemmie Evens

## 2018-02-06 NOTE — Telephone Encounter (Signed)
Prior authorization for Bystolic 20mg  has been completed, printed, awaiting MD signature before can be faxed to [place] for processing.

## 2018-02-06 NOTE — Telephone Encounter (Signed)
Patient called to update him in PA status. Informed him I will leave samples of Bystolic 20mg  for him to pick up while waiting on response (VQX:[id number] exp: 02/21)  He reports fatigue, recurrent chest pain. He is requesting another heart cath. He states he is aware of his pain threshold and know when to go to ED. He is not scheduled until 03/14/18. Will route to MD for advice.

## 2018-02-07 ENCOUNTER — Telehealth: Payer: Self-pay | Admitting: Internal Medicine

## 2018-02-07 NOTE — Telephone Encounter (Signed)
LMTCB

## 2018-02-07 NOTE — Telephone Encounter (Signed)
Patient calling, states that he is returning a call to office

## 2018-02-07 NOTE — Telephone Encounter (Signed)
Spoke with patient. He states he was never contacted by [facility] - Dr. Purcell Nails is the provider who does EECP per previous notes. Will forward to MD to see if he can contact this cardiologist.

## 2018-02-07 NOTE — Telephone Encounter (Signed)
Returned call to patient concerning chronic, recurrent angina. Noted in another encounter.

## 2018-02-14 ENCOUNTER — Telehealth: Payer: Self-pay | Admitting: Internal Medicine

## 2018-02-14 NOTE — Telephone Encounter (Signed)
New message   Pt states that he was suppose to be referred to a primary care doctor by haven't heard anything else from [place]. Please call

## 2018-02-14 NOTE — Telephone Encounter (Signed)
Faxed PA for bystolic to [place] [facility] @ [phone]

## 2018-02-14 NOTE — Telephone Encounter (Signed)
Returned call to patient.He stated he wants to be referred to a new PCP.Advised he does not need a referral to a PCP.Patient will call and schedule appointment.

## 2018-02-16 ENCOUNTER — Ambulatory Visit: Payer: Medicaid Other | Admitting: Family Medicine

## 2018-02-23 ENCOUNTER — Telehealth: Payer: Self-pay | Admitting: Internal Medicine

## 2018-02-23 ENCOUNTER — Ambulatory Visit: Payer: Medicaid Other | Admitting: Internal Medicine

## 2018-02-23 NOTE — Telephone Encounter (Signed)
Spoke with patient of Dr. Debara Pickett who c/o chest pain, SOB since 4am. He has extensive CAD with anginal flare ups. Advised patient he should go to ED for evaluation for acute symptoms. He will have his daughter take him to hospital   LM for Birdie Sons with patient info.  Called ED and notified nurse first Routed to MD as [facility]

## 2018-02-23 NOTE — Telephone Encounter (Signed)
New Message:     Pt c/o of Chest Pain: STAT if CP now or developed within 24 hours  1. Are you having CP right now? yes  2. Are you experiencing any other symptoms (ex. SOB, nausea, vomiting, sweating)? Sob, nausea, sweating  3. How long have you been experiencing CP? This morning around 4 am  4. Is your CP continuous or coming and going? Coming and going  5. Have you taken Nitroglycerin? 1   Pt c/o Shortness Of Breath: STAT if SOB developed within the last 24 hours or pt is noticeably SOB on the phone  1. Are you currently SOB (can you hear that pt is SOB on the phone)? Yes  2. How long have you been experiencing SOB? This morning  3. Are you SOB when sitting or when up moving around? Both  4. Are you currently experiencing any other symptoms?      ?

## 2018-02-23 NOTE — Telephone Encounter (Signed)
Ok thanks .Marland Kitchen We'll see him when he gets here.  Dr. Lemmie Evens

## 2018-02-24 ENCOUNTER — Observation Stay (HOSPITAL_COMMUNITY)
Admission: EM | Admit: 2018-02-24 | Discharge: 2018-02-26 | Disposition: A | Discharge status: Home / Self Care | Payer: Medicaid Other | Attending: Family Medicine | Admitting: Family Medicine

## 2018-02-24 ENCOUNTER — Emergency Department (HOSPITAL_COMMUNITY): Payer: Medicaid Other

## 2018-02-24 DIAGNOSIS — Z7902 Long term (current) use of antithrombotics/antiplatelets: Secondary | ICD-10-CM | POA: Diagnosis not present

## 2018-02-24 DIAGNOSIS — Z7951 Long term (current) use of inhaled steroids: Secondary | ICD-10-CM | POA: Insufficient documentation

## 2018-02-24 DIAGNOSIS — J309 Allergic rhinitis, unspecified: Secondary | ICD-10-CM | POA: Diagnosis not present

## 2018-02-24 DIAGNOSIS — Z79899 Other long term (current) drug therapy: Secondary | ICD-10-CM | POA: Diagnosis not present

## 2018-02-24 DIAGNOSIS — K219 Gastro-esophageal reflux disease without esophagitis: Secondary | ICD-10-CM | POA: Diagnosis not present

## 2018-02-24 DIAGNOSIS — J449 Chronic obstructive pulmonary disease, unspecified: Secondary | ICD-10-CM | POA: Diagnosis not present

## 2018-02-24 DIAGNOSIS — Z9861 Coronary angioplasty status: Secondary | ICD-10-CM

## 2018-02-24 DIAGNOSIS — Z7984 Long term (current) use of oral hypoglycemic drugs: Secondary | ICD-10-CM | POA: Insufficient documentation

## 2018-02-24 DIAGNOSIS — Z6835 Body mass index (BMI) 35.0-35.9, adult: Secondary | ICD-10-CM | POA: Diagnosis not present

## 2018-02-24 DIAGNOSIS — Z955 Presence of coronary angioplasty implant and graft: Secondary | ICD-10-CM | POA: Diagnosis not present

## 2018-02-24 DIAGNOSIS — G4733 Obstructive sleep apnea (adult) (pediatric): Secondary | ICD-10-CM | POA: Insufficient documentation

## 2018-02-24 DIAGNOSIS — R3911 Hesitancy of micturition: Secondary | ICD-10-CM | POA: Diagnosis not present

## 2018-02-24 DIAGNOSIS — Z87891 Personal history of nicotine dependence: Secondary | ICD-10-CM | POA: Insufficient documentation

## 2018-02-24 DIAGNOSIS — I2510 Atherosclerotic heart disease of native coronary artery without angina pectoris: Secondary | ICD-10-CM | POA: Diagnosis not present

## 2018-02-24 DIAGNOSIS — J4489 Other specified chronic obstructive pulmonary disease: Secondary | ICD-10-CM | POA: Diagnosis present

## 2018-02-24 DIAGNOSIS — F329 Major depressive disorder, single episode, unspecified: Secondary | ICD-10-CM | POA: Diagnosis not present

## 2018-02-24 DIAGNOSIS — I252 Old myocardial infarction: Secondary | ICD-10-CM | POA: Diagnosis not present

## 2018-02-24 DIAGNOSIS — E119 Type 2 diabetes mellitus without complications: Secondary | ICD-10-CM | POA: Diagnosis not present

## 2018-02-24 DIAGNOSIS — Z7982 Long term (current) use of aspirin: Secondary | ICD-10-CM | POA: Diagnosis not present

## 2018-02-24 DIAGNOSIS — R079 Chest pain, unspecified: Secondary | ICD-10-CM

## 2018-02-24 DIAGNOSIS — R072 Precordial pain: Secondary | ICD-10-CM | POA: Diagnosis not present

## 2018-02-24 DIAGNOSIS — N401 Enlarged prostate with lower urinary tract symptoms: Secondary | ICD-10-CM | POA: Diagnosis not present

## 2018-02-24 DIAGNOSIS — F419 Anxiety disorder, unspecified: Secondary | ICD-10-CM | POA: Diagnosis not present

## 2018-02-24 DIAGNOSIS — I10 Essential (primary) hypertension: Secondary | ICD-10-CM | POA: Diagnosis not present

## 2018-02-24 DIAGNOSIS — E669 Obesity, unspecified: Secondary | ICD-10-CM | POA: Diagnosis present

## 2018-02-24 DIAGNOSIS — D649 Anemia, unspecified: Secondary | ICD-10-CM | POA: Diagnosis not present

## 2018-02-24 DIAGNOSIS — E785 Hyperlipidemia, unspecified: Secondary | ICD-10-CM | POA: Diagnosis not present

## 2018-02-24 DIAGNOSIS — Z8249 Family history of ischemic heart disease and other diseases of the circulatory system: Secondary | ICD-10-CM | POA: Insufficient documentation

## 2018-02-24 LAB — POTASSIUM: Potassium: 3.7 mmol/L (ref 3.5–5.1)

## 2018-02-24 LAB — CBC
HCT: 31.1 % — ABNORMAL LOW (ref 39.0–52.0)
Hemoglobin: 9.9 g/dL — ABNORMAL LOW (ref 13.0–17.0)
MCH: 26.1 pg (ref 26.0–34.0)
MCHC: 31.8 g/dL (ref 30.0–36.0)
MCV: 82.1 fL (ref 78.0–100.0)
PLATELETS: 183 10*3/uL (ref 150–400)
RBC: 3.79 MIL/uL — ABNORMAL LOW (ref 4.22–5.81)
RDW: 13.5 % (ref 11.5–15.5)
WBC: 6.2 10*3/uL (ref 4.0–10.5)

## 2018-02-24 LAB — BASIC METABOLIC PANEL
ANION GAP: 7 (ref 5–15)
BUN: 7 mg/dL (ref 6–20)
CALCIUM: 7.8 mg/dL — AB (ref 8.9–10.3)
CO2: 24 mmol/L (ref 22–32)
CREATININE: 0.9 mg/dL (ref 0.61–1.24)
Chloride: 109 mmol/L (ref 101–111)
GFR calc Af Amer: >60 mL/min (ref >60)
GLUCOSE: 76 mg/dL (ref 65–99)
Potassium: 3.7 mmol/L (ref 3.5–5.1)
Sodium: 140 mmol/L (ref 135–145)

## 2018-02-24 LAB — I-STAT TROPONIN, ED: TROPONIN I, POC: 0.01 ng/mL (ref 0.00–0.08)

## 2018-02-24 MED ORDER — NITROGLYCERIN 0.4 MG SL SUBL
0.4000 mg | SUBLINGUAL_TABLET | SUBLINGUAL | Status: AC | PRN
  Administered 2018-02-24 (×3): 0.4 mg via SUBLINGUAL

## 2018-02-24 MED ORDER — ACETAMINOPHEN 500 MG PO TABS
1000.0000 mg | ORAL_TABLET | Freq: Once | ORAL | Status: AC
  Administered 2018-02-24: 1000 mg via ORAL
  Filled 2018-02-24: qty 2

## 2018-02-24 MED ORDER — SODIUM CHLORIDE 0.9 % IV BOLUS
1000.0000 mL | Freq: Once | INTRAVENOUS | Status: AC
  Administered 2018-02-24: 1000 mL via INTRAVENOUS

## 2018-02-24 MED ORDER — ALBUTEROL SULFATE (2.5 MG/3ML) 0.083% IN NEBU
2.5000 mg | INHALATION_SOLUTION | Freq: Once | RESPIRATORY_TRACT | Status: AC
  Administered 2018-02-24: 2.5 mg via RESPIRATORY_TRACT
  Filled 2018-02-24: qty 3

## 2018-02-24 NOTE — ED Triage Notes (Signed)
Pt BIB EMS with midsternal chest pain with no radiation for 36 hrs with no relief. EMS gave pt 324mg  aspirin, 2 nitro with no relief. Pt has significant cardiac hx including past MI and stent placement. Pt complaining of pressure on chest 8 (0-10 pain scale)

## 2018-02-24 NOTE — ED Notes (Signed)
Patient transported to X-ray 

## 2018-02-24 NOTE — ED Provider Notes (Signed)
Assumed care from Dr. Colin Mulders at shift change.  See prior notes for full H&P.  Briefly, 52 year old male with significant history of coronary artery disease with known occlusion to left circumflex but able to pass stent, presenting to the ED with chest pressure.  Has not had much relief with nitro thus far.  Labs overall reassuring.  Plan: Cardiology to evaluate.  Likely admit.  10:47 PM Cards has evaluated.  Feels likely [facility], however given history would feel safest option is to observe tonight, get echo in the morning.  Maybe hydrocodone for pain as not much change with nitro.  Recommends medicine admission.  Discussed with hospitalist team, they will admit for ongoing care.   Larene Pickett, PA-C 02/25/18 [id number]    Sherwood Gambler, MD 02/25/18 [phone]

## 2018-02-24 NOTE — ED Provider Notes (Signed)
[facility] EMERGENCY DEPARTMENT Provider Note   CSN: [id number] Arrival date & time: 02/24/18  1900     History   Chief Complaint Chief Complaint  Patient presents with  . Chest Pain    HPI Andrew West is a 52 y.o. male.  HPI  52 year old male with a history of significant coronary disease presents with chest pressure.  Started yesterday morning.  At first was intermittent but since about 11 AM has been constant.  The pressure is in the middle of his chest and is nonradiating.  He states he was a little bit sweaty today and has had nausea and shortness of breath.  A little bit of a cough.  No vomiting and no back pain or abdominal pain.  He took a nitro without significant relief.  He took 325 mg aspirin around 4 PM today. Chest pain feels like his prior heart attack. Has chronic leg pain that is unchanged but no leg swelling.  Past Medical History:  Diagnosis Date  . Anxiety   . CAD (coronary artery disease)    a. s/p multiple PCIs with last cath 11/2016 with severe multivessel CAD, s/p PCTA to LCx but unable to pass stent  . Chronic leg pain    bilateral  . Chronic lower back pain   . COPD (chronic obstructive pulmonary disease) ([place])   . Depression   . GERD (gastroesophageal reflux disease)    Takes Dexilant  . HLD (hyperlipidemia)   . Hypertension   . Rhabdomyolysis    h/o, r/t statins  . Sleep apnea    "can't tolerate mask" (12/16/2016)  . Type II diabetes mellitus [facility])     Patient Active Problem List   Diagnosis Date Noted  . Benign prostatic hyperplasia with urinary hesitancy 11/17/2017  . Preoperative cardiovascular examination 07/12/2017  . Refractory angina ([place]) 05/24/2017  . Complex regional pain syndrome type I 03/24/2017  . Progressive angina ([place]) -Class III 12/16/2016  . Anxiety 06/28/2016  . Diastolic dysfunction-grade 2 with EF 60-65% Oct 2016 03/22/2016  . Hypertension 10/03/2015  . CAD S/P multiple PCI's 10/03/2015  .  Pulmonary nodule 06/24/2015  . Cough 06/24/2015  . COPD with asthma ([place]) 06/24/2015  . Reactive airway disease 06/04/2015  . DOE (dyspnea on exertion) 04/15/2015  . Lumbar disc herniation with radiculopathy 03/31/2012  . Presence of stent in left circumflex coronary artery 10/18/2011    Class: History of  . TOBACCO ABUSE 02/27/2009  . ABDOMINAL PAIN, LEFT LOWER QUADRANT 12/30/2008  . ABDOMINAL PAIN, EPIGASTRIC 12/05/2008  . Depression 09/05/2008  . Hereditary and idiopathic peripheral neuropathy 09/05/2008  . GERD 09/05/2008  . Cervical disc disorder with radiculopathy of cervical region 08/19/2008  . HLD (hyperlipidemia) 04/25/2008    Class: Diagnosis of  . ALLERGIC RHINITIS 04/25/2008  . Backache 04/25/2008  . Chest pain, unspecified 04/25/2008  . COLONIC POLYPS, HX OF 04/25/2008  . Obesity 04/18/2008    Class: Diagnosis of  . ANAL FISSURE, HX OF 04/18/2008  . Diabetes mellitus ([place]) 01/30/2008    Class: History of  . RECTAL BLEEDING 01/30/2008    Past Surgical History:  Procedure Laterality Date  . BACK SURGERY    . CARDIAC CATHETERIZATION N/A 09/25/2015   Procedure: Left Heart Cath and Coronary Angiography;  Surgeon: Leonie Man, MD;  Location: [facility] CV LAB;  Service: Cardiovascular;  Laterality: N/A;  . CARDIAC CATHETERIZATION N/A 12/16/2016   Procedure: Left Heart Cath and Coronary Angiography;  Surgeon: Leonie Man, MD;  Location: [facility] CV LAB;  Service: Cardiovascular;  Laterality: N/A;  . CARDIAC CATHETERIZATION N/A 12/16/2016   Procedure: Coronary Balloon Angioplasty;  Surgeon: Leonie Man, MD;  Location: [facility] CV LAB;  Service: Cardiovascular;  Laterality: N/A;  . COLONOSCOPY W/ POLYPECTOMY    . CORONARY ANGIOPLASTY  09/25/2015   mid cir & om  . CORONARY ANGIOPLASTY WITH STENT PLACEMENT  10/09/2001   PTCA & stenting of mid AV circumflex; 2.5x17m Pixel stent  . CORONARY ANGIOPLASTY WITH STENT PLACEMENT  12/13/2001   PCI with stent to  mid L circumflex, 95% stenosis to 0% residual  . CORONARY ANGIOPLASTY WITH STENT PLACEMENT  10/10/2003   PCI to mid AV circumflex; LAD 30% disease; RCA 100% occluded prox.  . CORONARY ANGIOPLASTY WITH STENT PLACEMENT  09/01/2011   PCI with stenting with bare metal stent to mid AV groove circumflex and PDA  . CORONARY ANGIOPLASTY WITH STENT PLACEMENT  10/17/2011   cutting balloon angioplasty of ostial lateral OM1 branch and bifurcation AV groove circumflex OM junction; stenosis reduced to 0%  . EXCISIONAL HEMORRHOIDECTOMY    . LEFT HEART CATHETERIZATION WITH CORONARY ANGIOGRAM N/A 10/18/2011   Procedure: LEFT HEART CATHETERIZATION WITH CORONARY ANGIOGRAM;  Surgeon: DLeonie Man MD;  [facility];  Service: Cardiovascular;  Laterality: N/A;  . LUMBAR LAMINECTOMY/DECOMPRESSION MICRODISCECTOMY  03/31/2012   Procedure: LUMBAR LAMINECTOMY/DECOMPRESSION MICRODISCECTOMY 1 LEVEL;  Surgeon: HCharlie Pitter MD;  [facility];  Service: Neurosurgery;  Laterality: Left;  . TRANSTHORACIC ECHOCARDIOGRAM  07/28/2011   EF 55-65%; LVH, grade 1 diastolic dysfunction;         Home Medications    Prior to Admission medications   Medication Sig Start Date End Date Taking? Authorizing Provider  acetaminophen-codeine (TYLENOL #3) 300-30 MG tablet Take 1 tablet every 12 (twelve) hours as needed by mouth for moderate pain. 10/10/17   NCharlott Rakes MD  Albuterol Sulfate (PROAIR RESPICLICK) 1332(90 Base) MCG/ACT AEPB Inhale 1 puff into the lungs every 6 (six) hours as needed (shortness of breath). 04/08/17   NCharlott Rakes MD  amLODipine (NORVASC) 10 MG tablet Take 1 tablet (10 mg total) daily by mouth. 10/10/17   NCharlott Rakes MD  aspirin 81 MG tablet Take 1 tablet (81 mg total) daily by mouth. 10/10/17   NCharlott Rakes MD  atorvastatin (LIPITOR) 40 MG tablet Take 1 tablet (40 mg total) daily by mouth. 10/10/17   NCharlott Rakes MD  benzonatate (TESSALON) 100 MG capsule Take 1 capsule  (100 mg total) by mouth every 8 (eight) hours. 01/17/18   SLarene Pickett PA-C  Blood Glucose Monitoring Suppl (ACCU-CHEK AVIVA PLUS) w/Device KIT Use as dircted 07/12/17   NCharlott Rakes MD  cetirizine (ZYRTEC) 10 MG tablet Take 1 tablet (10 mg total) daily by mouth. 10/10/17   NCharlott Rakes MD  clopidogrel (PLAVIX) 75 MG tablet Take 1 tablet (75 mg total) daily by mouth. 10/10/17   NCharlott Rakes MD  doxycycline (VIBRAMYCIN) 100 MG capsule Take 1 capsule (100 mg total) by mouth 2 (two) times daily. 01/17/18   SLarene Pickett PA-C  FLUoxetine (PROZAC) 20 MG capsule TAKE 1 TABLET BY MOUTH DAILY. 10/10/17   NCharlott Rakes MD  fluticasone (FLONASE) 50 MCG/ACT nasal spray Place 2 sprays into both nostrils daily. Patient taking differently: Place 2 sprays into both nostrils daily as needed for allergies.  02/24/17   MArgentina Donovan PA-C  furosemide (LASIX) 20 MG tablet Take 20 mg by mouth 2 (two) times  daily.    [provider]  glucose blood (ACCU-CHEK AVIVA) test strip Use as instructed 07/12/17   [name], MD  hydrALAZINE (APRESOLINE) 25 MG tablet TAKE 1 TABLET BY MOUTH 2 TIMES DAILY. 11/28/17   Hilty, Nadean Corwin, MD  hydrocortisone-pramoxine [facility]) 2.5-1 % rectal cream PLACE 1 APPLICATION RECTALLY 3 (THREE) TIMES DAILY. 08/25/17   [name], MD  isosorbide mononitrate (IMDUR) 120 MG 24 hr tablet Take 1 tablet (120 mg total) daily by mouth. 10/10/17   [name], MD  losartan-hydrochlorothiazide (HYZAAR) 100-25 MG tablet Take 1 tablet by mouth daily. 11/30/17   [name], MD  metFORMIN (GLUCOPHAGE) 1000 MG tablet Take 1 tablet (1,000 mg total) 2 (two) times daily with a meal by mouth. 10/10/17   Newlin, Enobong, MD  mometasone (ASMANEX) 220 MCG/INH inhaler Inhale 2 puffs daily into the lungs. 10/10/17   [name], MD  Nebivolol HCl 20 MG TABS Take 1 tablet (20 mg total) by mouth daily. 01/26/18   Hilty, Nadean Corwin, MD  nitroGLYCERIN (NITROSTAT) 0.4 MG SL  tablet Place 1 tablet (0.4 mg total) under the tongue every 5 (five) minutes as needed for chest pain. 12/24/16   Ahmed Prima, Fransisco Hertz, PA-C  oxyCODONE-acetaminophen (PERCOCET/ROXICET) 5-325 MG tablet Take 1-2 tablets by mouth every 4 (four) hours as needed for severe pain. 06/06/17   Charlann Lange, PA-C  pantoprazole (PROTONIX) 40 MG tablet Take 1 tablet (40 mg total) daily by mouth. 10/10/17   [name], MD  pregabalin (LYRICA) 75 MG capsule Take 1 capsule (75 mg total) by mouth 3 (three) times daily. 07/12/17   Hilty, Nadean Corwin, MD  ranolazine (RANEXA) 1000 MG SR tablet Take 1 tablet (1,000 mg total) by mouth 2 (two) times daily. 05/24/17   Hilty, Nadean Corwin, MD  tamsulosin (FLOMAX) 0.4 MG CAPS capsule Take 1 capsule (0.4 mg total) daily by mouth. Patient taking differently: Take 0.4 mg by mouth 2 (two) times daily.  10/10/17   [name], MD  tiotropium (SPIRIVA HANDIHALER) 18 MCG inhalation capsule Place 1 capsule (18 mcg total) daily into inhaler and inhale. 10/10/17   [name], MD  traZODone (DESYREL) 50 MG tablet Take 1 tablet (50 mg total) at bedtime as needed by mouth. for sleep 10/10/17   [name], MD    Family History Family History  Problem Relation Age of Onset  . Heart attack Father   . Hypertension Mother   . Diabetes Mother   . Heart disease Brother        x 3   . Heart attack Brother        deceased  . Hypertension Sister   . Diabetes Sister   . Anesthesia problems Neg Hx   . Hypotension Neg Hx   . Malignant hyperthermia Neg Hx   . Pseudochol deficiency Neg Hx     Social History Social History   Tobacco Use  . Smoking status: Former Smoker    Packs/day: 0.25    Years: 25.00    Pack years: 6.25    Types: Cigarettes    Last attempt to quit: 05/24/2015    Years since quitting: 2.7  . Smokeless tobacco: Never Used  Substance Use Topics  . Alcohol use: No    Alcohol/week: 0.0 oz  . Drug use: No     Allergies   Iohexol   Review of  Systems Review of Systems  Constitutional: Positive for diaphoresis.  Respiratory: Positive for cough and shortness of breath.   Cardiovascular: Positive  for chest pain.  Gastrointestinal: Positive for nausea. Negative for abdominal pain and vomiting.  Musculoskeletal: Negative for back pain.  All other systems reviewed and are negative.    Physical Exam Updated Vital Signs BP (!) 135/94   Pulse 79   Temp 98.9 F (37.2 C) (Oral)   Resp (!) 23   Ht 6' (1.829 m)   Wt 106.6 kg (235 lb)   SpO2 98%   BMI 31.87 kg/m   Physical Exam  Constitutional: He is oriented to person, place, and time. He appears well-developed and well-nourished.  Non-toxic appearance. He does not appear ill. No distress.  HENT:  Head: Normocephalic and atraumatic.  Right Ear: External ear normal.  Left Ear: External ear normal.  Nose: Nose normal.  Eyes: Right eye exhibits no discharge. Left eye exhibits no discharge.  Neck: Neck supple.  Cardiovascular: Normal rate, regular rhythm and normal heart sounds.  Pulses:      Radial pulses are 2+ on the right side, and 2+ on the left side.  Pulmonary/Chest: Effort normal. He has wheezes (slight expiratory wheezes diffusely). He exhibits tenderness.    Abdominal: Soft. There is no tenderness.  Musculoskeletal: He exhibits no edema.  Neurological: He is alert and oriented to person, place, and time.  Skin: Skin is warm and dry.  Nursing note and vitals reviewed.    ED Treatments / Results  Labs (all labs ordered are listed, but only abnormal results are displayed) Labs Reviewed  BASIC METABOLIC PANEL - Abnormal; Notable for the following components:      Result Value   Calcium 7.8 (*)    All other components within normal limits  CBC - Abnormal; Notable for the following components:   RBC 3.79 (*)    Hemoglobin 9.9 (*)    HCT 31.1 (*)    All other components within normal limits  POTASSIUM  I-STAT TROPONIN, ED  POC OCCULT BLOOD, ED     EKG EKG Interpretation  Date/Time:  Friday February 24 2018 19:14:17 EDT Ventricular Rate:  70 PR Interval:  182 QRS Duration: 108 QT Interval:  424 QTC Calculation: 457 R Axis:   46 Text Interpretation:  Normal sinus rhythm Nonspecific T wave abnormality Abnormal ECG T waves flatter laterally when compared to Jan 2019 Confirmed by Sherwood Gambler [phone]) on 02/24/2018 7:36:36 PM   Radiology Dg Chest 2 View  Result Date: 02/24/2018 CLINICAL DATA:  Pt here via EMS with midsternal chest pain with no radiation for 36 hrs with no relief. EMS gave pt 34m aspirin, 2 nitro with no relief. Pt has significant cardiac hx including past MI and stent placement. Pt complaining of pressure on chest 8 (0-10 pain scale). Non smoker. Hx htn. EXAM: CHEST - 2 VIEW COMPARISON:  01/17/2018 FINDINGS: Cardiac silhouette is normal in size. No mediastinal or hilar masses. No convincing adenopathy. Clear lungs.  No pleural effusion or pneumothorax. Skeletal structures are unremarkable. IMPRESSION: No active cardiopulmonary disease. Electronically Signed   By: DLajean ManesM.D.   On: 02/24/2018 20:37    Procedures Procedures (including critical care time)  Medications Ordered in ED Medications  nitroGLYCERIN (NITROSTAT) SL tablet 0.4 mg (0.4 mg Sublingual Given 02/24/18 2106)  albuterol (PROVENTIL) (2.5 MG/3ML) 0.083% nebulizer solution 2.5 mg (2.5 mg Nebulization Given 02/24/18 2041)  acetaminophen (TYLENOL) tablet 1,000 mg (1,000 mg Oral Given 02/24/18 2046)  sodium chloride 0.9 % bolus 1,000 mL (0 mLs Intravenous Stopped 02/24/18 2209)     Initial Impression / Assessment and Plan /  ED Course  I have reviewed the triage vital signs and the nursing notes.  Pertinent labs & imaging results that were available during my care of the patient were reviewed by me and considered in my medical decision making (see chart for details).     Troponin negative. Suspicion for PE/dissection is low. Minimal change in pain  with nitro. However he also appears comfortable, no distress. ECG without ischemic changes. He has known CAD unable to be stented. Hgb lower than typical, denies rectal bleeding/melena and refuses rectal exam. Cards consulted, will see in ED. recs pending at this time, care transferred to [facility].  Final Clinical Impressions(s) / ED Diagnoses   Final diagnoses:  None    ED Discharge Orders    None       Sherwood Gambler, MD 02/24/18 2235

## 2018-02-25 ENCOUNTER — Other Ambulatory Visit: Payer: Self-pay

## 2018-02-25 ENCOUNTER — Encounter (HOSPITAL_COMMUNITY): Payer: Self-pay | Admitting: *Deleted

## 2018-02-25 DIAGNOSIS — Z9861 Coronary angioplasty status: Secondary | ICD-10-CM | POA: Diagnosis not present

## 2018-02-25 DIAGNOSIS — J449 Chronic obstructive pulmonary disease, unspecified: Secondary | ICD-10-CM | POA: Diagnosis not present

## 2018-02-25 DIAGNOSIS — I2510 Atherosclerotic heart disease of native coronary artery without angina pectoris: Secondary | ICD-10-CM | POA: Diagnosis not present

## 2018-02-25 DIAGNOSIS — E0800 Diabetes mellitus due to underlying condition with hyperosmolarity without nonketotic hyperglycemic-hyperosmolar coma (NKHHC): Secondary | ICD-10-CM | POA: Diagnosis not present

## 2018-02-25 DIAGNOSIS — I159 Secondary hypertension, unspecified: Secondary | ICD-10-CM

## 2018-02-25 DIAGNOSIS — E119 Type 2 diabetes mellitus without complications: Secondary | ICD-10-CM | POA: Diagnosis not present

## 2018-02-25 DIAGNOSIS — G4733 Obstructive sleep apnea (adult) (pediatric): Secondary | ICD-10-CM | POA: Diagnosis not present

## 2018-02-25 DIAGNOSIS — R072 Precordial pain: Secondary | ICD-10-CM

## 2018-02-25 DIAGNOSIS — R079 Chest pain, unspecified: Secondary | ICD-10-CM | POA: Diagnosis not present

## 2018-02-25 LAB — CBC
HEMATOCRIT: 40.1 % (ref 39.0–52.0)
HEMOGLOBIN: 13.3 g/dL (ref 13.0–17.0)
MCH: 27.2 pg (ref 26.0–34.0)
MCHC: 33.2 g/dL (ref 30.0–36.0)
MCV: 82 fL (ref 78.0–100.0)
Platelets: 213 10*3/uL (ref 150–400)
RBC: 4.89 MIL/uL (ref 4.22–5.81)
RDW: 14 % (ref 11.5–15.5)
WBC: 6.2 10*3/uL (ref 4.0–10.5)

## 2018-02-25 LAB — GLUCOSE, CAPILLARY
GLUCOSE-CAPILLARY: 96 mg/dL (ref 65–99)
GLUCOSE-CAPILLARY: 118 mg/dL — AB (ref 65–99)
GLUCOSE-CAPILLARY: 120 mg/dL — AB (ref 65–99)
Glucose-Capillary: 79 mg/dL (ref 65–99)

## 2018-02-25 LAB — IRON AND TIBC
Iron: 59 ug/dL (ref 45–182)
SATURATION RATIOS: 19 % (ref 17.9–39.5)
TIBC: 308 ug/dL (ref 250–450)
UIBC: 249 ug/dL

## 2018-02-25 LAB — CREATININE, SERUM
CREATININE: 0.87 mg/dL (ref 0.61–1.24)
GFR calc Af Amer: >60 mL/min (ref >60)

## 2018-02-25 LAB — FERRITIN: Ferritin: 52 ng/mL (ref 24–336)

## 2018-02-25 LAB — TROPONIN I

## 2018-02-25 LAB — VITAMIN B12: Vitamin B-12: 356 pg/mL (ref 180–914)

## 2018-02-25 LAB — MRSA PCR SCREENING: MRSA BY PCR: NEGATIVE

## 2018-02-25 MED ORDER — HYDRALAZINE HCL 50 MG PO TABS
25.0000 mg | ORAL_TABLET | Freq: Two times a day (BID) | ORAL | Status: DC
  Administered 2018-02-25 – 2018-02-26 (×3): 25 mg via ORAL
  Filled 2018-02-25 (×3): qty 1

## 2018-02-25 MED ORDER — MORPHINE SULFATE (PF) 4 MG/ML IV SOLN
2.0000 mg | Freq: Once | INTRAVENOUS | Status: AC
  Administered 2018-02-25: 2 mg via INTRAVENOUS
  Filled 2018-02-25: qty 1

## 2018-02-25 MED ORDER — LOSARTAN POTASSIUM-HCTZ 100-25 MG PO TABS: 1.0000 | ORAL_TABLET | Freq: Every day | ORAL | Status: DC

## 2018-02-25 MED ORDER — ASPIRIN 81 MG PO CHEW
81.0000 mg | CHEWABLE_TABLET | Freq: Every day | ORAL | Status: DC
  Administered 2018-02-25 – 2018-02-26 (×2): 81 mg via ORAL
  Filled 2018-02-25 (×2): qty 1

## 2018-02-25 MED ORDER — PREGABALIN 75 MG PO CAPS
75.0000 mg | ORAL_CAPSULE | Freq: Three times a day (TID) | ORAL | Status: DC
  Administered 2018-02-25 – 2018-02-26 (×5): 75 mg via ORAL
  Filled 2018-02-25 (×5): qty 1

## 2018-02-25 MED ORDER — ISOSORBIDE MONONITRATE ER 60 MG PO TB24
120.0000 mg | ORAL_TABLET | Freq: Every day | ORAL | Status: DC
  Administered 2018-02-25 – 2018-02-26 (×2): 120 mg via ORAL
  Filled 2018-02-25 (×2): qty 2

## 2018-02-25 MED ORDER — AMLODIPINE BESYLATE 10 MG PO TABS
10.0000 mg | ORAL_TABLET | Freq: Every day | ORAL | Status: DC
  Administered 2018-02-25 – 2018-02-26 (×2): 10 mg via ORAL
  Filled 2018-02-25 (×2): qty 1

## 2018-02-25 MED ORDER — FLUOXETINE HCL 20 MG PO CAPS
20.0000 mg | ORAL_CAPSULE | Freq: Every day | ORAL | Status: DC
  Administered 2018-02-25 – 2018-02-26 (×2): 20 mg via ORAL
  Filled 2018-02-25 (×2): qty 1

## 2018-02-25 MED ORDER — NEBIVOLOL HCL 10 MG PO TABS
20.0000 mg | ORAL_TABLET | Freq: Every day | ORAL | Status: DC
  Administered 2018-02-25 – 2018-02-26 (×2): 20 mg via ORAL
  Filled 2018-02-25 (×2): qty 4
  Filled 2018-02-25 (×2): qty 2

## 2018-02-25 MED ORDER — INSULIN ASPART 100 UNIT/ML ~~LOC~~ SOLN: 0.0000 [IU] | Freq: Three times a day (TID) | SUBCUTANEOUS | Status: DC

## 2018-02-25 MED ORDER — RANOLAZINE ER 500 MG PO TB12
1000.0000 mg | ORAL_TABLET | Freq: Two times a day (BID) | ORAL | Status: DC
  Administered 2018-02-25 – 2018-02-26 (×3): 1000 mg via ORAL
  Filled 2018-02-25 (×3): qty 2

## 2018-02-25 MED ORDER — CLOPIDOGREL BISULFATE 75 MG PO TABS
75.0000 mg | ORAL_TABLET | Freq: Every day | ORAL | Status: DC
  Administered 2018-02-25 – 2018-02-26 (×2): 75 mg via ORAL
  Filled 2018-02-25 (×2): qty 1

## 2018-02-25 MED ORDER — TIOTROPIUM BROMIDE MONOHYDRATE 18 MCG IN CAPS
18.0000 ug | ORAL_CAPSULE | Freq: Every day | RESPIRATORY_TRACT | Status: DC
  Administered 2018-02-25 – 2018-02-26 (×2): 18 ug via RESPIRATORY_TRACT
  Filled 2018-02-25: qty 5

## 2018-02-25 MED ORDER — HEPARIN SODIUM (PORCINE) 5000 UNIT/ML IJ SOLN
5000.0000 [IU] | Freq: Three times a day (TID) | INTRAMUSCULAR | Status: DC
  Administered 2018-02-25 – 2018-02-26 (×5): 5000 [IU] via SUBCUTANEOUS
  Filled 2018-02-25 (×5): qty 1

## 2018-02-25 MED ORDER — TRAZODONE HCL 50 MG PO TABS: 50.0000 mg | ORAL_TABLET | Freq: Every evening | ORAL | Status: DC | PRN

## 2018-02-25 MED ORDER — BUDESONIDE 0.5 MG/2ML IN SUSP
0.5000 mg | Freq: Two times a day (BID) | RESPIRATORY_TRACT | Status: DC
  Administered 2018-02-25 – 2018-02-26 (×2): 0.5 mg via RESPIRATORY_TRACT
  Filled 2018-02-25 (×2): qty 2

## 2018-02-25 MED ORDER — TAMSULOSIN HCL 0.4 MG PO CAPS
0.4000 mg | ORAL_CAPSULE | Freq: Every day | ORAL | Status: DC
  Administered 2018-02-25 – 2018-02-26 (×2): 0.4 mg via ORAL
  Filled 2018-02-25 (×2): qty 1

## 2018-02-25 MED ORDER — LOSARTAN POTASSIUM 50 MG PO TABS
100.0000 mg | ORAL_TABLET | Freq: Every day | ORAL | Status: DC
  Administered 2018-02-25 – 2018-02-26 (×2): 100 mg via ORAL
  Filled 2018-02-25 (×2): qty 2

## 2018-02-25 MED ORDER — PANTOPRAZOLE SODIUM 40 MG PO TBEC
40.0000 mg | DELAYED_RELEASE_TABLET | Freq: Every day | ORAL | Status: DC
  Administered 2018-02-25 – 2018-02-26 (×2): 40 mg via ORAL
  Filled 2018-02-25 (×2): qty 1

## 2018-02-25 MED ORDER — FUROSEMIDE 20 MG PO TABS
20.0000 mg | ORAL_TABLET | Freq: Two times a day (BID) | ORAL | Status: DC
  Administered 2018-02-25 – 2018-02-26 (×3): 20 mg via ORAL
  Filled 2018-02-25 (×4): qty 1

## 2018-02-25 MED ORDER — NITROGLYCERIN 0.4 MG SL SUBL: 0.4000 mg | SUBLINGUAL_TABLET | SUBLINGUAL | Status: DC | PRN

## 2018-02-25 MED ORDER — HYDROCHLOROTHIAZIDE 25 MG PO TABS
25.0000 mg | ORAL_TABLET | Freq: Every day | ORAL | Status: DC
  Administered 2018-02-25 – 2018-02-26 (×2): 25 mg via ORAL
  Filled 2018-02-25 (×2): qty 1

## 2018-02-25 MED ORDER — ALBUTEROL SULFATE (2.5 MG/3ML) 0.083% IN NEBU: 3.0000 mL | INHALATION_SOLUTION | Freq: Four times a day (QID) | RESPIRATORY_TRACT | Status: DC | PRN

## 2018-02-25 MED ORDER — TRAMADOL HCL 50 MG PO TABS
50.0000 mg | ORAL_TABLET | Freq: Once | ORAL | Status: AC
  Administered 2018-02-25: 50 mg via ORAL
  Filled 2018-02-25: qty 1

## 2018-02-25 MED ORDER — TRAMADOL HCL 50 MG PO TABS
50.0000 mg | ORAL_TABLET | Freq: Four times a day (QID) | ORAL | Status: DC | PRN
  Administered 2018-02-25 – 2018-02-26 (×3): 50 mg via ORAL
  Filled 2018-02-25 (×3): qty 1

## 2018-02-25 MED ORDER — KETOROLAC TROMETHAMINE 30 MG/ML IJ SOLN: 30.0000 mg | Freq: Once | INTRAMUSCULAR | Status: DC

## 2018-02-25 NOTE — Progress Notes (Signed)
Complains of headache.  Page sent to practitioner for an additional pain reliever.  Unable to give Ultram at this time due to dosing frequency.

## 2018-02-25 NOTE — Progress Notes (Addendum)
Progress Note  Patient Name: Andrew West Date of Encounter: 02/25/2018  Primary Cardiologist: Pixie Casino, MD   Subjective   Still with some pain, epigastric area, ultram helped earlier, no SOB   Inpatient Medications    Scheduled Meds: . amLODipine  10 mg Oral Daily  . aspirin  81 mg Oral Daily  . budesonide  0.5 mg Inhalation BID  . clopidogrel  75 mg Oral Daily  . FLUoxetine  20 mg Oral Daily  . furosemide  20 mg Oral BID  . heparin  5,000 Units Subcutaneous Q8H  . hydrALAZINE  25 mg Oral BID  . losartan  100 mg Oral Daily   And  . hydrochlorothiazide  25 mg Oral Daily  . insulin aspart  0-15 Units Subcutaneous TID WC  . isosorbide mononitrate  120 mg Oral Daily  . nebivolol  20 mg Oral Daily  . pantoprazole  40 mg Oral Daily  . pregabalin  75 mg Oral TID  . ranolazine  1,000 mg Oral BID  . tamsulosin  0.4 mg Oral Daily  . tiotropium  18 mcg Inhalation Daily   Continuous Infusions:  PRN Meds: albuterol, nitroGLYCERIN, traZODone   Vital Signs    Vitals:   02/25/18 0420 02/25/18 0700 02/25/18 0730 02/25/18 0847  BP: (!) 148/104 (!) 138/93 (!) 135/98   Pulse: 73 68 65   Resp: 19 19 18    Temp: 97.9 F (36.6 C) 98 F (36.7 C) 98 F (36.7 C)   TempSrc: Oral Oral Oral   SpO2: 95% 96% 95% 96%  Weight:      Height:        Intake/Output Summary (Last 24 hours) at 02/25/2018 1042 Last data filed at 02/25/2018 0730 Gross per 24 hour  Intake 1000 ml  Output 125 ml  Net 875 ml   Filed Weights   02/24/18 1935 02/25/18 0155  Weight: 235 lb (106.6 kg) 240 lb 11.9 oz (109.2 kg)    Telemetry    SR - Personally Reviewed  ECG    SR non specific T wave abnormality  - Personally Reviewed  Physical Exam   GEN: No acute distress.   Neck: No JVD Cardiac: RRR, no murmurs, rubs, or gallops.  Respiratory: Clear to auscultation bilaterally. GI: Soft, + epigastric tenderness, non-distended  MS: No edema; No deformity. Neuro:  Nonfocal  Psych: Normal  affect   Labs    Chemistry Recent Labs  Lab 02/24/18 1950 02/24/18 2123 02/25/18 0231  NA 140  --   --   K 3.7 3.7  --   CL 109  --   --   CO2 24  --   --   GLUCOSE 76  --   --   BUN 7  --   --   CREATININE 0.90  --  0.87  CALCIUM 7.8*  --   --   GFRNONAA >60  --  >60  GFRAA >60  --  >60  ANIONGAP 7  --   --      Hematology Recent Labs  Lab 02/24/18 1950 02/25/18 0747  WBC 6.2 6.2  RBC 3.79* 4.89  HGB 9.9* 13.3  HCT 31.1* 40.1  MCV 82.1 82.0  MCH 26.1 27.2  MCHC 31.8 33.2  RDW 13.5 14.0  PLT 183 213    Cardiac Enzymes Recent Labs  Lab 02/25/18 0231 02/25/18 0747  TROPONINI <0.03 <0.03    Recent Labs  Lab 02/24/18 2006  TROPIPOC 0.01  BNPNo results for input(s): BNP, PROBNP in the last 168 hours.   DDimer No results for input(s): DDIMER in the last 168 hours.   Radiology    Dg Chest 2 View  Result Date: 02/24/2018 CLINICAL DATA:  Pt here via EMS with midsternal chest pain with no radiation for 36 hrs with no relief. EMS gave pt 324mg  aspirin, 2 nitro with no relief. Pt has significant cardiac hx including past MI and stent placement. Pt complaining of pressure on chest 8 (0-10 pain scale). Non smoker. Hx htn. EXAM: CHEST - 2 VIEW COMPARISON:  01/17/2018 FINDINGS: Cardiac silhouette is normal in size. No mediastinal or hilar masses. No convincing adenopathy. Clear lungs.  No pleural effusion or pneumothorax. Skeletal structures are unremarkable. IMPRESSION: No active cardiopulmonary disease. Electronically Signed   By: Lajean Manes M.D.   On: 02/24/2018 20:37    Cardiac Studies   Conclusion  Cardiac cath 12/16/16     ____CULPRIT LESION________  Francesco Sor to 2nd Mrg lesion, 100 %stenosed. In-stent stenosis  Mid Cx-1 lesion, 100 %stenosed. In an pre-stent stenosis --> PTCA only performed Post intervention, there is a 50% residual stenosis.  Mid Cx-2 lesion, 100 %stenosed. PTCA only performed Post intervention, there is a 50% residual  stenosis.  Ost LPDA to LPDA lesion, 100 %stenosed.  Ost 3rd Mrg to 3rd Mrg lesion, 90 %stenosed.  Ost 4th Mrg to 4th Mrg lesion, 80 %stenosed.  __________________________  Mid LAD to Dist LAD lesion, 45 %stenosed.  Ramus lesion, 100 %stenosed. - In-stent stenosis  Prox RCA lesion, 100 %stenosed. - Chronic  The left ventricular systolic function is normal.  LV end diastolic pressure is normal.  There is no aortic valve stenosis.   Unfortunately the patient had total occlusion of the stented segment in the circumflex that involves the dominant circumflex vessels. The stented OM 2 branch now 100% occluded. Unfortunately, suboptimal minimally effective PTCA was performed. Unable to pass a stent to the desired location. The downstream vessels were extremely diffusely diseased and likely not good targets for bypass, however this could be considered.  Residual diffuse disease was the best that can be done with PTCA. Unable to pass stent. Very rigid lesion wiring high pressure inflation of a noncompliant balloon, but still unable to pass a stent.  PLAN: Overnight observation and prolonged PTCA procedure. We'll need to maximize medical management per Dr. Debara Pickett      Patient Profile     52 y.o. male with a hx significant for CAD with multiple prior interventions with unfortunate InStent restenosis with a most recent cath in 11/2016, HTN, HLP, COPD admitted with substernal chest pain that is reproducible and worse with deep inspirations.    Assessment & Plan    Chest pain --reducible no acute EKG changes --echo pending  troponins neg X 2, and poc.   CAD  With cath 11/2016 most recent with occluded marginals,  LCx 1and 2, LPDA 45%, Ramus 100%, pRCA 100%. He denies any heavy lifting due to pain that is generalized. OM2 branch was occluded, but given that suboptimal effectiveness with PTCA, they would have been able to stent the desired site. It was ultimately determined that patient is  not a candidate for a interventional or surgical therapy at that time.   HTN continue home meds BP 135/98   HLD continue statin   Anemia with hgb on admit 9.9 today 13.3 per IM  For questions or updates, please contact [place] Please consult [email or web address] for contact info  under Cardiology/STEMI.      Signed, Cecilie Kicks, NP  02/25/2018, 10:42 AM    I have seen and examined this patient with Cecilie Kicks.  Agree with above, note added to reflect my findings.  On exam, RRR, no murmurs, lungs clear. Cotnining to have chest pain. Pain worse with palpation. Troponin negative. Likely noncardiac. Plan for TTE and if stable, no further cardiac workup needed.    Dawan Farney M. Zachary Lovins MD 02/25/2018 11:17 AM

## 2018-02-25 NOTE — Consult Note (Signed)
CARDIOLOGY CONSULT NOTE   Referring Physician: Dr. Regenia Skeeter Primary Physician: Dr. [name] Primary Cardiologist: Dr. Debara Pickett Reason for Consultation: chest pain   HPI:  Patient is a 52 y/o M with a hx significant for CAD with multiple prior interventions with unfortunate InStent restenosis with a most recent cath in 11/2016, HTN, HLP, COPD comes in today complaining of substernal chest pain that is reproducible and worse with deep inspirations. Pain started yesterday morning, and woke him from his sleep and has been persistent since. He denies any exertional symptoms, SOB, lower extremity edema, PND or orthopnea. He denies coughing, sneezing, fevers. He claims to be complaint with all of which medications.  Nitro didn't help him much, despite him receving around 6 nitro in this time span. He did develop a headache from all the nitroglycerin. He's had similar episodes last month for which he was admitted for atypical chest pain r/o and then d/c with pain that was thought to be MSK/pleuritic in nature. Patient's most recent Cath was in Jan of 2018 which shows occluded marginals, LCx 1and 2, LPDA 45%, Ramus 100%, pRCA 100%. He denies any heavy lifting due to pain that is generalized. OM2 branch was occluded, but given that suboptimal effectiveness with PTCA, they would have been able to stent the desired site. It was ultimately determined that patient is not a candidate for a interventional or surgical therapy at that time.  He denies smoking, drinking, drug use    Ost 2nd Mrg to 2nd Mrg lesion, 100 %stenosed. In-stent stenosis  Mid Cx-1 lesion, 100 %stenosed. In an pre-stent stenosis --> PTCA only performed Post intervention, there is a 50% residual stenosis.  Mid Cx-2 lesion, 100 %stenosed. PTCA only performed Post intervention, there is a 50% residual stenosis.  Ost LPDA to LPDA lesion, 100 %stenosed.  Ost 3rd Mrg to 3rd Mrg lesion, 90 %stenosed.  Ost 4th Mrg to 4th Mrg lesion, 80  %stenosed.  __________________________  Mid LAD to Dist LAD lesion, 45 %stenosed.  Ramus lesion, 100 %stenosed. - In-stent stenosis  Prox RCA lesion, 100 %stenosed. - Chronic  The left ventricular systolic function is normal.  LV end diastolic pressure is normal.  There is no aortic valve stenosis.  Review of Systems:     Cardiac Review of Systems: {Y] = yes [ ]  = no  Chest Pain [ x   ]  Resting SOB [   ] Exertional SOB  [  ]  Orthopnea [  ]   Pedal Edema [   ]    Palpitations [  ] Syncope  [  ]   Presyncope [   ]  General Review of Systems: [Y] = yes [  ]=no Constitional: recent weight change [  ]; anorexia [  ]; fatigue [  ]; nausea [  ]; night sweats [  ]; fever [  ]; or chills [  ];                                                                     Eyes : blurred vision [  ]; diplopia [   ]; vision changes [  ];  Amaurosis fugax[  ]; Resp: cough [  ];  wheezing[  ];  hemoptysis[  ];  PND [  ];  GI:  gallstones[  ], vomiting[  ];  dysphagia[  ]; melena[  ];  hematochezia [  ]; heartburn[  ];   GU: kidney stones [  ]; hematuria[  ];   dysuria [  ];  nocturia[  ]; incontinence [  ];             Skin: rash, swelling[  ];, hair loss[  ];  peripheral edema[  ];  or itching[  ]; Musculosketetal: myalgias[  ];  joint swelling[  ];  joint erythema[  ];  joint pain[  ];  back pain[  ];  Heme/Lymph: bruising[  ];  bleeding[  ];  anemia[  ];  Neuro: TIA[  ];  headaches[  ];  stroke[  ];  vertigo[  ];  seizures[  ];   paresthesias[  ];  difficulty walking[  ];  Psych:depression[  ]; anxiety[  ];  Endocrine: diabetes[  ];  thyroid dysfunction[  ];  Other:  Past Medical History:  Diagnosis Date  . Anxiety   . CAD (coronary artery disease)    a. s/p multiple PCIs with last cath 11/2016 with severe multivessel CAD, s/p PCTA to LCx but unable to pass stent  . Chronic leg pain    bilateral  . Chronic lower back pain   . COPD (chronic obstructive pulmonary disease) ([place])   . Depression     . GERD (gastroesophageal reflux disease)    Takes Dexilant  . HLD (hyperlipidemia)   . Hypertension   . Rhabdomyolysis    h/o, r/t statins  . Sleep apnea    "can't tolerate mask" (12/16/2016)  . Type II diabetes mellitus ([place])     (Not in a hospital admission)  Infusions:   Allergies  Allergen Reactions  . Iohexol Anaphylaxis    PT. TO BE PREMEDICATED PRIOR TO IV CONTRAST PER DR Kris Hartmann /MMS//12/15/15Desc: PT BECAME SOB AND CHEST TIGHTNESS AFTER CONTRAST INJECTION.  Audree Bane., Onset Date: [id number]    Social History   Socioeconomic History  . Marital status: Married    Spouse name: Not on file  . Number of children: Not on file  . Years of education: Not on file  . Highest education level: Not on file  Occupational History  . Not on file  Social Needs  . Financial resource strain: Not on file  . Food insecurity:    Worry: Not on file    Inability: Not on file  . Transportation needs:    Medical: Not on file    Non-medical: Not on file  Tobacco Use  . Smoking status: Former Smoker    Packs/day: 0.25    Years: 25.00    Pack years: 6.25    Types: Cigarettes    Last attempt to quit: 05/24/2015    Years since quitting: 2.7  . Smokeless tobacco: Never Used  Substance and Sexual Activity  . Alcohol use: No    Alcohol/week: 0.0 oz  . Drug use: No  . Sexual activity: Yes  Lifestyle  . Physical activity:    Days per week: Not on file    Minutes per session: Not on file  . Stress: Not on file  Relationships  . Social connections:    Talks on phone: Not on file    Gets together: Not on file    Attends religious service: Not on file    Active member of club or organization: Not on file    Attends meetings of clubs  or organizations: Not on file    Relationship status: Not on file  . Intimate partner violence:    Fear of current or ex partner: Not on file    Emotionally abused: Not on file    Physically abused: Not on file    Forced sexual activity:  Not on file  Other Topics Concern  . Not on file  Social History Narrative  . Not on file   Family History  Problem Relation Age of Onset  . Heart attack Father   . Hypertension Mother   . Diabetes Mother   . Heart disease Brother        x 3   . Heart attack Brother        deceased  . Hypertension Sister   . Diabetes Sister   . Anesthesia problems Neg Hx   . Hypotension Neg Hx   . Malignant hyperthermia Neg Hx   . Pseudochol deficiency Neg Hx    PHYSICAL EXAM: Vitals:   02/25/18 0030 02/25/18 0100  BP: (!) 139/91 (!) 140/94  Pulse: 79 68  Resp: 18 18  Temp:    SpO2: 98% 95%    Intake/Output Summary (Last 24 hours) at 02/25/2018 0125 Last data filed at 02/24/2018 2209 Gross per 24 hour  Intake 1000 ml  Output -  Net 1000 ml   General:  Well appearing. No respiratory difficulty HEENT: normal Neck: supple. no JVD. Carotids 2+ bilat; no bruits.  Cor: PMI nondisplaced. Regular rate & rhythm. No rubs, gallops or murmurs. Substernal chest tenderness on palpation Lungs: clear Abdomen: soft, nontender, nondistended. No hepatosplenomegaly. No bruits or masses. Good bowel sounds. Extremities: no cyanosis, clubbing, rash, edema Neuro: alert & oriented x 3, cranial nerves grossly intact. moves all 4 extremities w/o difficulty. Affect pleasant.  ECG:  Results for orders placed or performed during the hospital encounter of 02/24/18 (from the past 24 hour(s))  Basic metabolic panel     Status: Abnormal   Collection Time: 02/24/18  7:50 PM  Result Value Ref Range   Sodium 140 135 - 145 mmol/L   Potassium 3.7 3.5 - 5.1 mmol/L   Chloride 109 101 - 111 mmol/L   CO2 24 22 - 32 mmol/L   Glucose, Bld 76 65 - 99 mg/dL   BUN 7 6 - 20 mg/dL   Creatinine, Ser 0.90 0.61 - 1.24 mg/dL   Calcium 7.8 (L) 8.9 - 10.3 mg/dL   GFR calc non Af Amer >60 >60 mL/min   GFR calc Af Amer >60 >60 mL/min   Anion gap 7 5 - 15  CBC     Status: Abnormal   Collection Time: 02/24/18  7:50 PM  Result  Value Ref Range   WBC 6.2 4.0 - 10.5 K/uL   RBC 3.79 (L) 4.22 - 5.81 MIL/uL   Hemoglobin 9.9 (L) 13.0 - 17.0 g/dL   HCT 31.1 (L) 39.0 - 52.0 %   MCV 82.1 78.0 - 100.0 fL   MCH 26.1 26.0 - 34.0 pg   MCHC 31.8 30.0 - 36.0 g/dL   RDW 13.5 11.5 - 15.5 %   Platelets 183 150 - 400 K/uL  I-stat troponin, ED     Status: None   Collection Time: 02/24/18  8:06 PM  Result Value Ref Range   Troponin i, poc 0.01 0.00 - 0.08 ng/mL   Comment 3          Potassium     Status: None   Collection Time: 02/24/18  9:23 PM  Result Value Ref Range   Potassium 3.7 3.5 - 5.1 mmol/L   Dg Chest 2 View  Result Date: 02/24/2018 CLINICAL DATA:  Pt here via EMS with midsternal chest pain with no radiation for 36 hrs with no relief. EMS gave pt 324mg  aspirin, 2 nitro with no relief. Pt has significant cardiac hx including past MI and stent placement. Pt complaining of pressure on chest 8 (0-10 pain scale). Non smoker. Hx htn. EXAM: CHEST - 2 VIEW COMPARISON:  01/17/2018 FINDINGS: Cardiac silhouette is normal in size. No mediastinal or hilar masses. No convincing adenopathy. Clear lungs.  No pleural effusion or pneumothorax. Skeletal structures are unremarkable. IMPRESSION: No active cardiopulmonary disease. Electronically Signed   By: Lajean Manes M.D.   On: 02/24/2018 20:37   ASSESSMENT:  Non-cardiac chest wall tenderness, likely MSK in etiology Hx of CAD with multiple interventions in the past with occluded stents and in stent restenosis as noted in the cath from 2018 Normocytic anemia HTN, HLP, DM COPD  PLAN/DISCUSSION:  Patient's chest pain is reproducible. He doesn't have any new ST-T wave changes concerning for ischemia. Initial troponin is negative. Consider repeating at least two more. Will obtain echo in the AM to reassess EF and wall motion. Consider ordering a urine drug screen. If troponin's remain negative with an unchanged echo this is less likely to be cardiac in etiology. Cont the asa and  plavix.  Very complicated CAD with recurrent interventions with instent stenoses. Last cath as noted in 11/2016, where they were unable to pass the stent to the desired location.  Restart home antihypertensive medications. His med list has a lot of duplicates, will await for pharmacy reconciliation.  Primary to workup the anemia. We appreciate their assistance with the case.  Willeen Cass Fellow

## 2018-02-25 NOTE — Progress Notes (Signed)
Patient seen and examined at bedside, patient admitted after midnight, please see earlier detailed admission note by Gwynne Edinger, MD. Briefly, patient presented with chest pain concerning for ACS. So far, workup has been negative. EKG with non-specific t-wave changes in lateral leads that appear unchanged from previous EKG 3 months prior. Chest pain is persisting with improvement after using nitroglycerin. Cardiology recommending Transthoracic Echocardiogram which is pending. Chest pain is likely costochondritis as his pain is reproducible even on my exam. Heart healthy/carb modified diet. Cardiology recommendations today. Anticipate discharge home in 24 hours.   Cordelia Poche, MD [facility] 02/25/2018, 8:21 AM Pager: [phone]

## 2018-02-25 NOTE — H&P (Signed)
History and Physical    Andrew West ZDG:[id number] DOB: October 23, 1966 DOA: 02/24/2018  PCP: [name], MD  Patient coming from: home   Chief Complaint: chest pain  HPI: Andrew West is a 52 y.o. male with medical history significant for CAD s/p multiple PCIs and stents, DM, OSA not on cpap, htn, copd, former smoker, who presents with one day of substernal chest pain.  No recent increase in activity or injury but says hurts when presses on it. Not associated w/ exertion, not relieved w/ rest. Not associated w/ eating. No cough or shortness of breath or hemoptysis or leg swelling, not worse with breathing. Does not radiate.   Took 325 of aspirin shortly prior to arrival  ED Course: nitroglycerine, labs, cardiology consult  Review of Systems: As per HPI otherwise 10 point review of systems negative.    Past Medical History:  Diagnosis Date  . Anxiety   . CAD (coronary artery disease)    a. s/p multiple PCIs with last cath 11/2016 with severe multivessel CAD, s/p PCTA to LCx but unable to pass stent  . Chronic leg pain    bilateral  . Chronic lower back pain   . COPD (chronic obstructive pulmonary disease) (HCC)   . Depression   . GERD (gastroesophageal reflux disease)    Takes Dexilant  . HLD (hyperlipidemia)   . Hypertension   . Rhabdomyolysis    h/o, r/t statins  . Sleep apnea    "can't tolerate mask" (12/16/2016)  . Type II diabetes mellitus (HCC)     Past Surgical History:  Procedure Laterality Date  . BACK SURGERY    . CARDIAC CATHETERIZATION N/A 09/25/2015   Procedure: Left Heart Cath and Coronary Angiography;  Surgeon: Marykay Lex, MD;  Location: [facility] INVASIVE CV LAB;  Service: Cardiovascular;  Laterality: N/A;  . CARDIAC CATHETERIZATION N/A 12/16/2016   Procedure: Left Heart Cath and Coronary Angiography;  Surgeon: Marykay Lex, MD;  Location: [facility] INVASIVE CV LAB;  Service: Cardiovascular;  Laterality: N/A;  . CARDIAC CATHETERIZATION N/A 12/16/2016   Procedure: Coronary Balloon Angioplasty;  Surgeon: Marykay Lex, MD;  Location: [facility] INVASIVE CV LAB;  Service: Cardiovascular;  Laterality: N/A;  . COLONOSCOPY W/ POLYPECTOMY    . CORONARY ANGIOPLASTY  09/25/2015   mid cir & om  . CORONARY ANGIOPLASTY WITH STENT PLACEMENT  10/09/2001   PTCA & stenting of mid AV circumflex; 2.5x80mm Pixel stent  . CORONARY ANGIOPLASTY WITH STENT PLACEMENT  12/13/2001   PCI with stent to mid L circumflex, 95% stenosis to 0% residual  . CORONARY ANGIOPLASTY WITH STENT PLACEMENT  10/10/2003   PCI to mid AV circumflex; LAD 30% disease; RCA 100% occluded prox.  . CORONARY ANGIOPLASTY WITH STENT PLACEMENT  09/01/2011   PCI with stenting with bare metal stent to mid AV groove circumflex and PDA  . CORONARY ANGIOPLASTY WITH STENT PLACEMENT  10/17/2011   cutting balloon angioplasty of ostial lateral OM1 branch and bifurcation AV groove circumflex OM junction; stenosis reduced to 0%  . EXCISIONAL HEMORRHOIDECTOMY    . LEFT HEART CATHETERIZATION WITH CORONARY ANGIOGRAM N/A 10/18/2011   Procedure: LEFT HEART CATHETERIZATION WITH CORONARY ANGIOGRAM;  Surgeon: Marykay Lex, MD;  Location: [facility] CATH LAB;  Service: Cardiovascular;  Laterality: N/A;  . LUMBAR LAMINECTOMY/DECOMPRESSION MICRODISCECTOMY  03/31/2012   Procedure: LUMBAR LAMINECTOMY/DECOMPRESSION MICRODISCECTOMY 1 LEVEL;  Surgeon: Temple Pacini, MD;  Location: [facility] NEURO ORS;  Service: Neurosurgery;  Laterality: Left;  . TRANSTHORACIC ECHOCARDIOGRAM  07/28/2011  EF 55-65%; LVH, grade 1 diastolic dysfunction;      reports that he quit smoking about 2 years ago. His smoking use included cigarettes. He has a 6.25 pack-year smoking history. He has never used smokeless tobacco. He reports that he does not drink alcohol or use drugs.  Allergies  Allergen Reactions  . Iohexol Anaphylaxis    PT. TO BE PREMEDICATED PRIOR TO IV CONTRAST PER DR Eppie Gibson /MMS//12/15/15Desc: PT BECAME SOB AND CHEST TIGHTNESS AFTER CONTRAST  INJECTION.  [name],RT-RCT., Onset Date: [id number]     Family History  Problem Relation Age of Onset  . Heart attack Father   . Hypertension Mother   . Diabetes Mother   . Heart disease Brother        x 3   . Heart attack Brother        deceased  . Hypertension Sister   . Diabetes Sister   . Anesthesia problems Neg Hx   . Hypotension Neg Hx   . Malignant hyperthermia Neg Hx   . Pseudochol deficiency Neg Hx     Prior to Admission medications   Medication Sig Start Date End Date Taking? Authorizing Provider  Albuterol Sulfate (PROAIR RESPICLICK) 108 (90 Base) MCG/ACT AEPB Inhale 1 puff into the lungs every 6 (six) hours as needed (shortness of breath). 04/08/17  Yes [name], MD  amLODipine (NORVASC) 10 MG tablet Take 1 tablet (10 mg total) daily by mouth. 10/10/17  Yes Newlin, Enobong, MD  aspirin 81 MG tablet Take 1 tablet (81 mg total) daily by mouth. 10/10/17  Yes Newlin, Enobong, MD  atorvastatin (LIPITOR) 40 MG tablet Take 1 tablet (40 mg total) daily by mouth. 10/10/17  Yes Newlin, Enobong, MD  cetirizine (ZYRTEC) 10 MG tablet Take 1 tablet (10 mg total) daily by mouth. 10/10/17  Yes Newlin, Enobong, MD  clopidogrel (PLAVIX) 75 MG tablet Take 1 tablet (75 mg total) daily by mouth. 10/10/17  Yes Newlin, Enobong, MD  FLUoxetine (PROZAC) 20 MG capsule TAKE 1 TABLET BY MOUTH DAILY. 10/10/17  Yes Newlin, Odette Horns, MD  fluticasone (FLONASE) 50 MCG/ACT nasal spray Place 2 sprays into both nostrils daily. Patient taking differently: Place 2 sprays into both nostrils daily as needed for allergies.  02/24/17  Yes Georgian Co M, PA-C  furosemide (LASIX) 20 MG tablet Take 20 mg by mouth 2 (two) times daily.   Yes [provider]  hydrALAZINE (APRESOLINE) 25 MG tablet TAKE 1 TABLET BY MOUTH 2 TIMES DAILY. 11/28/17  Yes Hilty, Lisette Abu, MD  hydrocortisone-pramoxine [employer]) 2.5-1 % rectal cream PLACE 1 APPLICATION RECTALLY 3 (THREE) TIMES DAILY. 08/25/17  Yes  [name], MD  isosorbide mononitrate (IMDUR) 120 MG 24 hr tablet Take 1 tablet (120 mg total) daily by mouth. 10/10/17  Yes Newlin, Enobong, MD  losartan-hydrochlorothiazide (HYZAAR) 100-25 MG tablet Take 1 tablet by mouth daily. 11/30/17  Yes [name], MD  metFORMIN (GLUCOPHAGE) 1000 MG tablet Take 1 tablet (1,000 mg total) 2 (two) times daily with a meal by mouth. 10/10/17  Yes Newlin, Enobong, MD  mometasone (ASMANEX) 220 MCG/INH inhaler Inhale 2 puffs daily into the lungs. 10/10/17  Yes [name], MD  Nebivolol HCl 20 MG TABS Take 1 tablet (20 mg total) by mouth daily. 01/26/18  Yes Hilty, Lisette Abu, MD  nitroGLYCERIN (NITROSTAT) 0.4 MG SL tablet Place 1 tablet (0.4 mg total) under the tongue every 5 (five) minutes as needed for chest pain. 12/24/16  Yes Strader, [place] M, PA-C  pantoprazole (PROTONIX) 40  MG tablet Take 1 tablet (40 mg total) daily by mouth. 10/10/17  Yes Newlin, Enobong, MD  pregabalin (LYRICA) 75 MG capsule Take 1 capsule (75 mg total) by mouth 3 (three) times daily. 07/12/17  Yes Hilty, Lisette Abu, MD  ranolazine (RANEXA) 1000 MG SR tablet Take 1 tablet (1,000 mg total) by mouth 2 (two) times daily. 05/24/17  Yes Hilty, Lisette Abu, MD  tamsulosin (FLOMAX) 0.4 MG CAPS capsule Take 1 capsule (0.4 mg total) daily by mouth. Patient taking differently: Take 0.4 mg by mouth 2 (two) times daily.  10/10/17  Yes [name], MD  tiotropium (SPIRIVA HANDIHALER) 18 MCG inhalation capsule Place 1 capsule (18 mcg total) daily into inhaler and inhale. 10/10/17  Yes [name], MD  traZODone (DESYREL) 50 MG tablet Take 1 tablet (50 mg total) at bedtime as needed by mouth. for sleep 10/10/17  Yes [name], MD  acetaminophen-codeine (TYLENOL #3) 300-30 MG tablet Take 1 tablet every 12 (twelve) hours as needed by mouth for moderate pain. Patient not taking: Reported on 02/24/2018 10/10/17   [name], MD  benzonatate (TESSALON) 100 MG capsule Take 1 capsule (100  mg total) by mouth every 8 (eight) hours. Patient not taking: Reported on 02/24/2018 01/17/18   Garlon Hatchet, PA-C  Blood Glucose Monitoring Suppl (ACCU-CHEK AVIVA PLUS) w/Device KIT Use as dircted 07/12/17   [name], MD  doxycycline (VIBRAMYCIN) 100 MG capsule Take 1 capsule (100 mg total) by mouth 2 (two) times daily. Patient not taking: Reported on 02/24/2018 01/17/18   Garlon Hatchet, PA-C  glucose blood (ACCU-CHEK AVIVA) test strip Use as instructed 07/12/17   [name], MD  oxyCODONE-acetaminophen (PERCOCET/ROXICET) 5-325 MG tablet Take 1-2 tablets by mouth every 4 (four) hours as needed for severe pain. Patient not taking: Reported on 02/24/2018 06/06/17   Elpidio Anis, PA-C    Physical Exam: Vitals:   02/24/18 2200 02/24/18 2230 02/24/18 2245 02/24/18 2300  BP: (!) 135/94 108/83 (!) 131/94 (!) 132/98  Pulse: 79 78 80 80  Resp: (!) 23 12 (!) 24 17  Temp:      TempSrc:      SpO2: 98% 96% 96% 92%  Weight:      Height:        Constitutional: No acute distress Head: Atraumatic Eyes: Conjunctiva clear ENM: Moist mucous membranes. Normal dentition.  Neck: Supple Respiratory: Clear to auscultation bilaterally, no wheezing/rales/rhonchi. Normal respiratory effort. No accessory muscle use. . Cardiovascular: Regular rate and rhythm. Distant heart sounds. Faint systolic murmur. ttp left lateral sternal border. No bruising Abdomen: Non-tender, non-distended. No masses. No rebound or guarding. Positive bowel sounds. Musculoskeletal: No joint deformity upper and lower extremities. Normal ROM, no contractures. Normal muscle tone.  Skin: No rashes, lesions, or ulcers.  Extremities: No peripheral edema. Palpable peripheral pulses. Neurologic: Alert, moving all 4 extremities. Psychiatric: Normal insight and judgement.   Labs on Admission: I have personally reviewed following labs and imaging studies  CBC: Recent Labs  Lab 02/24/18 1950  WBC 6.2  HGB 9.9*  HCT 31.1*  MCV  82.1  PLT 183   Basic Metabolic Panel: Recent Labs  Lab 02/24/18 1950 02/24/18 2123  NA 140  --   K 3.7 3.7  CL 109  --   CO2 24  --   GLUCOSE 76  --   BUN 7  --   CREATININE 0.90  --   CALCIUM 7.8*  --    GFR: Estimated Creatinine Clearance: 122.5 mL/min (by C-G  formula based on SCr of 0.9 mg/dL). Liver Function Tests: No results for input(s): AST, ALT, ALKPHOS, BILITOT, PROT, ALBUMIN in the last 168 hours. No results for input(s): LIPASE, AMYLASE in the last 168 hours. No results for input(s): AMMONIA in the last 168 hours. Coagulation Profile: No results for input(s): INR, PROTIME in the last 168 hours. Cardiac Enzymes: No results for input(s): CKTOTAL, CKMB, CKMBINDEX, TROPONINI in the last 168 hours. BNP (last 3 results) No results for input(s): PROBNP in the last 8760 hours. HbA1C: No results for input(s): HGBA1C in the last 72 hours. CBG: No results for input(s): GLUCAP in the last 168 hours. Lipid Profile: No results for input(s): CHOL, HDL, LDLCALC, TRIG, CHOLHDL, LDLDIRECT in the last 72 hours. Thyroid Function Tests: No results for input(s): TSH, T4TOTAL, FREET4, T3FREE, THYROIDAB in the last 72 hours. Anemia Panel: No results for input(s): VITAMINB12, FOLATE, FERRITIN, TIBC, IRON, RETICCTPCT in the last 72 hours. Urine analysis:    Component Value Date/Time   COLORURINE AMBER (A) 01/23/2014 2333   APPEARANCEUR CLOUDY (A) 01/23/2014 2333   LABSPEC 1.032 (H) 01/23/2014 2333   PHURINE 5.5 01/23/2014 2333   GLUCOSEU NEGATIVE 01/23/2014 2333   GLUCOSEU NEGATIVE 12/30/2008 1531   HGBUR NEGATIVE 01/23/2014 2333   BILIRUBINUR small 10/10/2017 1455   KETONESUR NEGATIVE 01/23/2014 2333   PROTEINUR 30 10/10/2017 1455   PROTEINUR NEGATIVE 01/23/2014 2333   UROBILINOGEN 1.0 10/10/2017 1455   UROBILINOGEN 1.0 01/23/2014 2333   NITRITE negative 10/10/2017 1455   NITRITE NEGATIVE 01/23/2014 2333   LEUKOCYTESUR Negative 10/10/2017 1455    Radiological Exams on  Admission: Dg Chest 2 View  Result Date: 02/24/2018 CLINICAL DATA:  Pt here via EMS with midsternal chest pain with no radiation for 36 hrs with no relief. EMS gave pt 324mg  aspirin, 2 nitro with no relief. Pt has significant cardiac hx including past MI and stent placement. Pt complaining of pressure on chest 8 (0-10 pain scale). Non smoker. Hx htn. EXAM: CHEST - 2 VIEW COMPARISON:  01/17/2018 FINDINGS: Cardiac silhouette is normal in size. No mediastinal or hilar masses. No convincing adenopathy. Clear lungs.  No pleural effusion or pneumothorax. Skeletal structures are unremarkable. IMPRESSION: No active cardiopulmonary disease. Electronically Signed   By: Amie Portland M.D.   On: 02/24/2018 20:37    EKG: Independently reviewed. No ischemic changes  Assessment/Plan Active Problems:   Diabetes mellitus (HCC)   Obesity   COPD with asthma (HCC)   Hypertension   CAD S/P multiple PCI's   Substernal chest pain    # Substernal chest pain # CAD - quite tender to palpation so favor costochondritis. Reassuringly ekg non-iscemic and initial troponin negative. Given sig cardiac hx cardiology consulted; they want overnight monitoring and tte in am - trend enzymes - tele - am ekg - tte ordered - continue home asa/plavix  # normocytic anemia - h 9.9, from baseline normal. Denies melena/hematochezia - f/u occult blood stool - iron panel - b12/folate - am cbc  # COPD - continue home mometasone and albuterol  # HTN - continue home furosemide, hydralazine, imdur, hyzaar  # DM - hold home metformin, start SSI      DVT prophylaxis: heparin Code Status: full  Family Communication: mother dorothy Elbert [phone]  Disposition Plan: tbd Consults called: cardiology  Admission status: obs tele    Silvano Bilis MD [facility] Pager [phone]  If 7PM-7AM, please contact night-coverage [email or web address] Password [email or web address]  02/25/2018, 12:51 AM

## 2018-02-25 NOTE — Plan of Care (Signed)
Continue current care plan 

## 2018-02-26 ENCOUNTER — Other Ambulatory Visit (HOSPITAL_COMMUNITY): Payer: Medicaid Other

## 2018-02-26 ENCOUNTER — Observation Stay (HOSPITAL_BASED_OUTPATIENT_CLINIC_OR_DEPARTMENT_OTHER): Payer: Medicaid Other

## 2018-02-26 DIAGNOSIS — I10 Essential (primary) hypertension: Secondary | ICD-10-CM | POA: Diagnosis not present

## 2018-02-26 DIAGNOSIS — R072 Precordial pain: Secondary | ICD-10-CM

## 2018-02-26 DIAGNOSIS — I2510 Atherosclerotic heart disease of native coronary artery without angina pectoris: Secondary | ICD-10-CM | POA: Diagnosis not present

## 2018-02-26 DIAGNOSIS — Z9861 Coronary angioplasty status: Secondary | ICD-10-CM | POA: Diagnosis not present

## 2018-02-26 DIAGNOSIS — R079 Chest pain, unspecified: Secondary | ICD-10-CM | POA: Diagnosis not present

## 2018-02-26 LAB — GLUCOSE, CAPILLARY
GLUCOSE-CAPILLARY: 99 mg/dL (ref 65–99)
GLUCOSE-CAPILLARY: 92 mg/dL (ref 65–99)

## 2018-02-26 LAB — ECHOCARDIOGRAM COMPLETE
HEIGHTINCHES: 69 in
Weight: 3851.88 oz

## 2018-02-26 MED ORDER — DICLOFENAC SODIUM 1 % TD GEL: 2.0000 g | Freq: Four times a day (QID) | TRANSDERMAL | 0 refills | Status: DC

## 2018-02-26 MED ORDER — DICLOFENAC SODIUM 1 % TD GEL
2.0000 g | Freq: Four times a day (QID) | TRANSDERMAL | Status: DC
  Administered 2018-02-26 (×3): 2 g via TOPICAL
  Filled 2018-02-26: qty 100

## 2018-02-26 NOTE — Progress Notes (Signed)
Discussed and explained discharge instructions,pt aware of follow up appointment tomorrow. Pt going home with mother via w/c with cellphone and charger and other belongings.

## 2018-02-26 NOTE — Plan of Care (Signed)
Continue current care plan 

## 2018-02-26 NOTE — Discharge Instructions (Signed)
Chest Wall Pain °Chest wall pain is pain in or around the bones and muscles of your chest. Sometimes, an injury causes this pain. Sometimes, the cause may not be known. This pain may take several weeks or longer to get better. °Follow these instructions at home: °Pay attention to any changes in your symptoms. Take these actions to help with your pain: °· Rest as told by your health care provider. °· Avoid activities that cause pain. These include any activities that use your chest muscles or your abdominal and side muscles to lift heavy items. °· If directed, apply ice to the painful area: °? Put ice in a plastic bag. °? Place a towel between your skin and the bag. °? Leave the ice on for 20 minutes, 2-3 times per day. °· Take over-the-counter and prescription medicines only as told by your health care provider. °· Do not use tobacco products, including cigarettes, chewing tobacco, and e-cigarettes. If you need help quitting, ask your health care provider. °· Keep all follow-up visits as told by your health care provider. This is important. ° °Contact a health care provider if: °· You have a fever. °· Your chest pain becomes worse. °· You have new symptoms. °Get help right away if: °· You have nausea or vomiting. °· You feel sweaty or light-headed. °· You have a cough with phlegm (sputum) or you cough up blood. °· You develop shortness of breath. °This information is not intended to replace advice given to you by your health care provider. Make sure you discuss any questions you have with your health care provider. °Document Released: 11/08/2005 Document Revised: 03/18/2016 Document Reviewed: 02/03/2015 °Elsevier Interactive Patient Education © 2018 [employer] ° °

## 2018-02-26 NOTE — Progress Notes (Signed)
  Echocardiogram 2D Echocardiogram has been performed.  Merrie Roof F 02/26/2018, 11:32 AM

## 2018-02-26 NOTE — Discharge Summary (Signed)
Physician Discharge Summary  Andrew West ZOX:[id number] DOB: 11-Jun-1966 DOA: 02/24/2018  PCP: [name], MD  Admit date: 02/24/2018 Discharge date: 02/26/2018  Admitted From: Home Disposition: Home  Recommendations for Outpatient Follow-up:  1. Follow up with PCP in 1 week 2. Please obtain BMP/CBC in one week 3. Please follow up on the following pending results: None  Home Health: None Equipment/Devices: None  Discharge Condition: Stable CODE STATUS: Full code Diet recommendation: Heart health   Brief/Interim Summary:  Admission HPI written by Kathrynn Running, MD   Chief Complaint: chest pain  HPI: Andrew West is a 52 y.o. male with medical history significant for CAD s/p multiple PCIs and stents, DM, OSA not on cpap, htn, copd, former smoker, who presents with one day of substernal chest pain.  No recent increase in activity or injury but says hurts when presses on it. Not associated w/ exertion, not relieved w/ rest. Not associated w/ eating. No cough or shortness of breath or hemoptysis or leg swelling, not worse with breathing. Does not radiate.   Took 325 of aspirin shortly prior to arrival  ED Course: nitroglycerine, labs, cardiology consult    Hospital course:  Substernal chest pain EKG normal, troponin negative. Reproducible. Transthoracic Echocardiogram significant for normal EF with questionable anterolateral hypokinesis. Cardiology okay with discharge with outpatient follow-up. Voltaren gel for reproducible chest pain.  Normocytic anemia Stable  COPD Stable  Essential hypertension Continued antihypertensives  Diabetes mellitus, type 2 Sliding scale insulin while inpatient. Resume home metformin on discharge.  Discharge Diagnoses:  Active Problems:   Diabetes mellitus (HCC)   Obesity   COPD with asthma (HCC)   Hypertension   CAD S/P multiple PCI's   Substernal chest pain    Discharge Instructions  Discharge  Instructions    Call MD for:  difficulty breathing, headache or visual disturbances   Complete by:  As directed    Diet - low sodium heart healthy   Complete by:  As directed    Increase activity slowly   Complete by:  As directed      Allergies as of 02/26/2018      Reactions   Iohexol Anaphylaxis   PT. TO BE PREMEDICATED PRIOR TO IV CONTRAST PER DR Eppie Gibson /MMS//12/15/15Desc: PT BECAME SOB AND CHEST TIGHTNESS AFTER CONTRAST INJECTION.  [name],RT-RCT., Onset Date: [id number]      Medication List    STOP taking these medications   doxycycline 100 MG capsule Commonly known as:  VIBRAMYCIN     TAKE these medications   ACCU-CHEK AVIVA PLUS w/Device Kit Use as dircted   acetaminophen-codeine 300-30 MG tablet Commonly known as:  TYLENOL #3 Take 1 tablet every 12 (twelve) hours as needed by mouth for moderate pain.   Albuterol Sulfate 108 (90 Base) MCG/ACT Aepb Commonly known as:  PROAIR RESPICLICK Inhale 1 puff into the lungs every 6 (six) hours as needed (shortness of breath).   amLODipine 10 MG tablet Commonly known as:  NORVASC Take 1 tablet (10 mg total) daily by mouth.   aspirin 81 MG tablet Take 1 tablet (81 mg total) daily by mouth.   atorvastatin 40 MG tablet Commonly known as:  LIPITOR Take 1 tablet (40 mg total) daily by mouth.   benzonatate 100 MG capsule Commonly known as:  TESSALON Take 1 capsule (100 mg total) by mouth every 8 (eight) hours.   cetirizine 10 MG tablet Commonly known as:  ZYRTEC Take 1 tablet (10 mg total) daily by  mouth.   clopidogrel 75 MG tablet Commonly known as:  PLAVIX Take 1 tablet (75 mg total) daily by mouth.   diclofenac sodium 1 % Gel Commonly known as:  VOLTAREN Apply 2 g topically 4 (four) times daily.   FLUoxetine 20 MG capsule Commonly known as:  PROZAC TAKE 1 TABLET BY MOUTH DAILY.   fluticasone 50 MCG/ACT nasal spray Commonly known as:  FLONASE Place 2 sprays into both nostrils daily. What changed:     when to take this  reasons to take this   furosemide 20 MG tablet Commonly known as:  LASIX Take 20 mg by mouth 2 (two) times daily.   glucose blood test strip Commonly known as:  ACCU-CHEK AVIVA Use as instructed   hydrALAZINE 25 MG tablet Commonly known as:  APRESOLINE TAKE 1 TABLET BY MOUTH 2 TIMES DAILY.   hydrocortisone-pramoxine 2.5-1 % rectal cream Commonly known as:  ANALPRAM-HC PLACE 1 APPLICATION RECTALLY 3 (THREE) TIMES DAILY.   isosorbide mononitrate 120 MG 24 hr tablet Commonly known as:  IMDUR Take 1 tablet (120 mg total) daily by mouth.   losartan-hydrochlorothiazide 100-25 MG tablet Commonly known as:  HYZAAR Take 1 tablet by mouth daily.   metFORMIN 1000 MG tablet Commonly known as:  GLUCOPHAGE Take 1 tablet (1,000 mg total) 2 (two) times daily with a meal by mouth.   mometasone 220 MCG/INH inhaler Commonly known as:  ASMANEX Inhale 2 puffs daily into the lungs.   Nebivolol HCl 20 MG Tabs Take 1 tablet (20 mg total) by mouth daily.   nitroGLYCERIN 0.4 MG SL tablet Commonly known as:  NITROSTAT Place 1 tablet (0.4 mg total) under the tongue every 5 (five) minutes as needed for chest pain.   oxyCODONE-acetaminophen 5-325 MG tablet Commonly known as:  PERCOCET/ROXICET Take 1-2 tablets by mouth every 4 (four) hours as needed for severe pain.   pantoprazole 40 MG tablet Commonly known as:  PROTONIX Take 1 tablet (40 mg total) daily by mouth.   pregabalin 75 MG capsule Commonly known as:  LYRICA Take 1 capsule (75 mg total) by mouth 3 (three) times daily.   ranolazine 1000 MG SR tablet Commonly known as:  RANEXA Take 1 tablet (1,000 mg total) by mouth 2 (two) times daily.   tamsulosin 0.4 MG Caps capsule Commonly known as:  FLOMAX Take 1 capsule (0.4 mg total) daily by mouth. What changed:  when to take this   tiotropium 18 MCG inhalation capsule Commonly known as:  SPIRIVA HANDIHALER Place 1 capsule (18 mcg total) daily into inhaler  and inhale.   traZODone 50 MG tablet Commonly known as:  DESYREL Take 1 tablet (50 mg total) at bedtime as needed by mouth. for sleep      Follow-up Information    [name], MD. Schedule an appointment as soon as possible for a visit in 1 week(s).   Specialty:  Family Medicine Contact information: [address] [place] [address] [phone]        Andrew Nose, MD .   Specialty:  Cardiology Contact information: [address] [place] [address] [phone]          Allergies  Allergen Reactions  . Iohexol Anaphylaxis    PT. TO BE PREMEDICATED PRIOR TO IV CONTRAST PER DR Eppie Gibson /MMS//12/15/15Desc: PT BECAME SOB AND CHEST TIGHTNESS AFTER CONTRAST INJECTION.  Ardis Hughs., Onset Date: [id number]     Consultations:  Cardiology   Procedures/Studies: Dg Chest 2 View  Result Date:  02/24/2018 CLINICAL DATA:  Pt here via EMS with midsternal chest pain with no radiation for 36 hrs with no relief. EMS gave pt 324mg  aspirin, 2 nitro with no relief. Pt has significant cardiac hx including past MI and stent placement. Pt complaining of pressure on chest 8 (0-10 pain scale). Non smoker. Hx htn. EXAM: CHEST - 2 VIEW COMPARISON:  01/17/2018 FINDINGS: Cardiac silhouette is normal in size. No mediastinal or hilar masses. No convincing adenopathy. Clear lungs.  No pleural effusion or pneumothorax. Skeletal structures are unremarkable. IMPRESSION: No active cardiopulmonary disease. Electronically Signed   By: Amie Portland M.D.   On: 02/24/2018 20:37    Transthoracic Echocardiogram (4/7) Study Conclusions  - Left ventricle: The cavity size was normal. Wall thickness was   normal. Systolic function was normal. The estimated ejection   fraction was in the range of 55% to 60%. Possible hypokinesis of   the inferolateral myocardium. Doppler parameters are consistent   with abnormal left ventricular relaxation (grade 1 diastolic    dysfunction). - Mitral valve: Calcified annulus. - Left atrium: The atrium was mildly dilated.  Impressions:  - Possible mild hypokinesis of the inferolateral wall with overall   normal LV systolic function; mild diastolic dysfunction; mild   LAE.   Subjective: Chest pain improved.  Discharge Exam: Vitals:   02/26/18 0830 02/26/18 1133  BP:  119/80  Pulse:  65  Resp:  17  Temp:  97.9 F (36.6 C)  SpO2: 97% 99%   Vitals:   02/26/18 0700 02/26/18 0723 02/26/18 0830 02/26/18 1133  BP: 129/88 129/88  119/80  Pulse: 70 72  65  Resp:    17  Temp: 98.1 F (36.7 C) 98.1 F (36.7 C)  97.9 F (36.6 C)  TempSrc: Oral Oral  Oral  SpO2: 95% 94% 97% 99%  Weight:      Height:        General: Pt is alert, awake, not in acute distress Cardiovascular: RRR, S1/S2 +, no rubs, no gallops Respiratory: CTA bilaterally, no wheezing, no rhonchi Abdominal: Soft, NT, ND, bowel sounds + Extremities: no edema, no cyanosis    The results of significant diagnostics from this hospitalization (including imaging, microbiology, ancillary and laboratory) are listed below for reference.     Microbiology: Recent Results (from the past 240 hour(s))  MRSA PCR Screening     Status: None   Collection Time: 02/25/18  2:00 AM  Result Value Ref Range Status   MRSA by PCR NEGATIVE NEGATIVE Final    Comment:        The GeneXpert MRSA Assay (FDA approved for NASAL specimens only), is one component of a comprehensive MRSA colonization surveillance program. It is not intended to diagnose MRSA infection nor to guide or monitor treatment for MRSA infections. Performed at [facility], [address], [place], [place] [address]      Labs: BNP (last 3 results) Recent Labs    05/09/17 2139 11/29/17 0117  BNP 36.7 50.4   Basic Metabolic Panel: Recent Labs  Lab 02/24/18 1950 02/24/18 2123 02/25/18 0231  NA 140  --   --   K 3.7 3.7  --   CL 109  --   --   CO2 24  --   --     GLUCOSE 76  --   --   BUN 7  --   --   CREATININE 0.90  --  0.87  CALCIUM 7.8*  --   --  Liver Function Tests: No results for input(s): AST, ALT, ALKPHOS, BILITOT, PROT, ALBUMIN in the last 168 hours. No results for input(s): LIPASE, AMYLASE in the last 168 hours. No results for input(s): AMMONIA in the last 168 hours. CBC: Recent Labs  Lab 02/24/18 1950 02/25/18 0747  WBC 6.2 6.2  HGB 9.9* 13.3  HCT 31.1* 40.1  MCV 82.1 82.0  PLT 183 213   Cardiac Enzymes: Recent Labs  Lab 02/25/18 0231 02/25/18 0747  TROPONINI <0.03 <0.03   BNP: Invalid input(s): POCBNP CBG: Recent Labs  Lab 02/25/18 1151 02/25/18 1713 02/25/18 2108 02/26/18 0731 02/26/18 1137  GLUCAP 96 118* 120* 99 92   D-Dimer No results for input(s): DDIMER in the last 72 hours. Hgb A1c No results for input(s): HGBA1C in the last 72 hours. Lipid Profile No results for input(s): CHOL, HDL, LDLCALC, TRIG, CHOLHDL, LDLDIRECT in the last 72 hours. Thyroid function studies No results for input(s): TSH, T4TOTAL, T3FREE, THYROIDAB in the last 72 hours.  Invalid input(s): FREET3 Anemia work up Recent Labs    02/25/18 0231  VITAMINB12 356  FERRITIN 52  TIBC 308  IRON 59   Urinalysis    Component Value Date/Time   COLORURINE AMBER (A) 01/23/2014 2333   APPEARANCEUR CLOUDY (A) 01/23/2014 2333   LABSPEC 1.032 (H) 01/23/2014 2333   PHURINE 5.5 01/23/2014 2333   GLUCOSEU NEGATIVE 01/23/2014 2333   GLUCOSEU NEGATIVE 12/30/2008 1531   HGBUR NEGATIVE 01/23/2014 2333   BILIRUBINUR small 10/10/2017 1455   KETONESUR NEGATIVE 01/23/2014 2333   PROTEINUR 30 10/10/2017 1455   PROTEINUR NEGATIVE 01/23/2014 2333   UROBILINOGEN 1.0 10/10/2017 1455   UROBILINOGEN 1.0 01/23/2014 2333   NITRITE negative 10/10/2017 1455   NITRITE NEGATIVE 01/23/2014 2333   LEUKOCYTESUR Negative 10/10/2017 1455   Sepsis Labs Invalid input(s): PROCALCITONIN,  WBC,  LACTICIDVEN Microbiology Recent Results (from the past 240  hour(s))  MRSA PCR Screening     Status: None   Collection Time: 02/25/18  2:00 AM  Result Value Ref Range Status   MRSA by PCR NEGATIVE NEGATIVE Final    Comment:        The GeneXpert MRSA Assay (FDA approved for NASAL specimens only), is one component of a comprehensive MRSA colonization surveillance program. It is not intended to diagnose MRSA infection nor to guide or monitor treatment for MRSA infections. Performed at [facility], [address] [place], [place] [address]      SIGNED:   Jacquelin Hawking, MD [facility] 02/26/2018, 3:11 PM Pager [phone]  If 7PM-7AM, please contact night-coverage [email or web address] Password [email or web address]

## 2018-02-26 NOTE — Plan of Care (Signed)
Pt being discharge to home

## 2018-02-27 ENCOUNTER — Encounter: Payer: Self-pay | Admitting: Internal Medicine

## 2018-02-27 ENCOUNTER — Ambulatory Visit (INDEPENDENT_AMBULATORY_CARE_PROVIDER_SITE_OTHER): Payer: Medicaid Other | Admitting: Internal Medicine

## 2018-02-27 VITALS — BP 134/93 | HR 80 | Ht 69.0 in | Wt 245.0 lb

## 2018-02-27 DIAGNOSIS — Z9861 Coronary angioplasty status: Secondary | ICD-10-CM | POA: Diagnosis not present

## 2018-02-27 DIAGNOSIS — I208 Other forms of angina pectoris: Secondary | ICD-10-CM | POA: Diagnosis not present

## 2018-02-27 DIAGNOSIS — I202 Refractory angina pectoris: Secondary | ICD-10-CM

## 2018-02-27 DIAGNOSIS — I2510 Atherosclerotic heart disease of native coronary artery without angina pectoris: Secondary | ICD-10-CM

## 2018-02-27 DIAGNOSIS — R079 Chest pain, unspecified: Secondary | ICD-10-CM

## 2018-02-27 LAB — FOLATE RBC
Folate, Hemolysate: 243.8 ng/mL
Folate, RBC: 613 ng/mL (ref >498)
Hematocrit: 39.8 % (ref 37.5–51.0)

## 2018-02-27 MED ORDER — TRAMADOL HCL 50 MG PO TABS: 50.0000 mg | ORAL_TABLET | Freq: Four times a day (QID) | ORAL | 1 refills | Status: DC | PRN

## 2018-02-27 NOTE — Patient Instructions (Addendum)
Dr. Debara Pickett has referred you to Dr. Purcell Nails @ [facility] for EECP therapy evaluation   Dr. Debara Pickett has prescribed tramadol to use as needed for severe pain   Your physician wants you to follow-up in: 6 months with Dr. Debara Pickett. You will receive a reminder letter in the mail two months in advance. If you don't receive a letter, please call our office to schedule the follow-up appointment.

## 2018-02-27 NOTE — Progress Notes (Signed)
OFFICE NOTE  Chief Complaint:  Recurrent chest pain, hospital follow-up  Primary Care Physician: [name], MD  HPI:  Andrew West  is a 52 year old gentleman with a history of coronary artery disease and stent placement to the circumflex and obtuse marginal and a history of in stent restenosis to the LAD. There is also a nondominant right coronary artery that is 100% occluded, filled with collaterals. He did have a stress test recently, which showed an EF of 53% and reversible septal ischemia in the setting of chest pain and after much convincing underwent cardiac catheterization.  He then underwent cutting balloon angioplasty to an ostial lateral OM1 branch and the bifurcation AV groove circumflex OM junction. This was successful at reducing the stenosis to 0%. This was in November 2012 and he did not return for followup appointment until April 2013. He was suffering from low back pain and has been evaluated and treated by Dr. Trenton Gammon with a laminectomy/discectomy, which he says was not helpful. Otherwise, he continues to smoke and occasionally complains of shortness of breath and some chest pain symptoms which are atypical. He is on long-acting and/or has not needed to take short-acting nitroglycerin. His other concern today is that he is having problems with his teeth and was recommended to have edentulation and by an oral surgeon Dr. Diona Browner. Finally, he is suffering from significant stress and depression due to recent separation with wife in dealing with his kids. This seems to be a big tablets on his continued smoking.  [name] returns today in the office. He feels fairly well. He denies any chest pain. He continues to have problems with low back pain and numbness and tingling in his legs. He is apparently status post laminectomy and microdiscectomy by Dr. Trenton Gammon and continues to have symptoms which may be related to that. He's also complaining of what sounds like  neuropathic pain. He is not currently on medication. He is asking for Tylenol 3 for pain today which I told him I did not prescribe. Ultimately there are no further surgical or nonsurgical options, he may need to go on a neuropathic pain medication or perhaps be referred to a pain management specialist. He is also looking for a new primary care provider as his primary care provider is close to retirement. He reports he has cut back his smoking to about 1 pack every 2 weeks. He is also been out of amlodipine and simvastatin although his blood pressure is well controlled.  I saw [name] in the office today. He is reporting now complains of shortness of breath and chest pain. He also feels like his breathing is worse when working around chemicals at work. He uses a respiratory protection mask but also feels like he is under significant stress. He's had missed several days of work and is concerned about his job. Sounds like he is in a difficult work environment. He says that he is apologizing for "deceiving me" that he was not honest about his symptoms. He apparently has been having shortness of breath and chest pain for several months and did not relay that to me.  [name] returns to the office today for follow-up. His main complaint is shortness of breath. He's not feeling as much chest pain he's had been previously. We recently did a nuclear stress test which was negative for ischemia and showed an EF of 56%. With regards to shortness of breath he's concerned most of this was related to chemicals at  work although he has not worked in several months. He's had about 20 pound weight gain since we last saw him in the office and denies any lower extremity swelling, orthopnea or PND. Shortness of breath is persistent and is associated with some wheezing. He did see Dr. 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 Shortness of breath seems to be a little bit worse with exertion which makes me wonder whether there is an element of exercise-induced pulmonary hypertension. He's not had an echocardiogram in some time.  [name] returns today for follow-up. He was noted to have some diastolic dysfunction on his echocardiogram. I started him on low-dose Lasix and check lab work including a BNP which is very low around 50. On follow-up today he reports he feels no different with the addition of Lasix. I therefore asked him to take it as needed. He is also describing worsening substernal chest discomfort and a squeezing pressure in his chest today. He says this is been getting worse over the past several days, more than his typical chest discomfort. As previously noted he recently underwent a nuclear stress test a few months ago which was negative for ischemia.   Mr. States returns for follow-up. As mentioned he had had recent progressive chest pain symptoms and worsening shortness of breath. He underwent another cardiac catheterization which demonstrated the following:   Ost 2nd Mrg to 2nd Mrg lesion, 99% stenosed. Post intervention, 99% residual stenosis remained. The lesion was previously treated with a bare metal stentgreater than two years ago.  Mid Cx-2 lesion, 90% stenosed. Post intervention, there is a 20% residual stenosis.  Mid Cx-1 lesion, 80% stenosed. Post intervention, there is a 50% residual stenosis. The lesion was previously treated with a bare metal stent.  Ramus lesion, 100% stenosed. The lesion was previously treated with a bare metal stentgreater than two years ago.  Prox RCA lesion, 100% stenosed.  There is mild left ventricular systolic dysfunction.  Moderately elevated LVEDP of 20 mmHg   Difficult situation with what amounts to be a totally occluded (In-stent re-stenosis) of the OM2 branch with sub-optimal PTCA of the mid AV-Groove In-stent  restenosis. He continues to have chest pain although reports it somewhat improved. He was started on ranolazine and currently is on 1000 mg twice a day.  Mr. Conroy returns today for follow-up. He reports he is doing fairly well on medical therapy. He still gets some sharp chest pain mostly along the right sternal border. He does get short of breath with moderate exertion. He's trying to do some walking on a treadmill but generally stops the exercise once he gets short of breath because he is very nervous. He is not currently working due to combined cardiac pulmonary and musculoskeletal diseases. He says that he and his wife are going to go on a delayed honeymoon since they never took one.  04/20/2016  Mr. Nienhaus returns today for follow-up. He was recently seen in the hospital in the beginning of May for chest pain. He reports that it's up in the left neck base around the area of the left clavicle. It's very exquisitely tender to touch and feels sharp and electric. This pain is related we think to cervical pain. He ruled out for MI and was not worked up further for coronary ischemia. This pain is felt to be distinctly different. His  isosorbide was increased up to 60 mg twice a day but he notes no change in his symptoms with that. His primary care providers hesitant to provide any pain medication.  06/28/2016  Mr. Wilner was seen back today in follow-up. He recently was in the ER at [facility] the left without being seen after 3 hours. An EKG was performed and showed no ischemic changes. He reported going home and taking some nitroglycerin and aspirin with some eventual improvement in his symptoms. He continues to have a sharp left chest discomfort which is tender to the touch and is persistent. It's not necessarily worse with exertion or relieved by rest. I felt this may likely be a neuropathic pain and I recommended starting gabapentin 300 mg daily at bedtime. He reports no improvement with this medication. I  advised him to discontinue today. He's also concerned about poor sleep at night, significant preoccupation with death and anxiety. This is concerning for possible PTSD type symptoms.  08/23/2016  Mr. Montelongo returns today for follow-up. I referred him to behavioral health and he says that he went but it was not helpful. The medical record however does not show any evidence that he made that appointment. He did present to the emergency department on 06/29/2016 with depression and anxiety and was felt not to need inpatient treatment. He underwent nuclear stress testing which was negative for ischemia and showed normal LV function. I do not believe is ongoing chest pain at this time is due to ischemia. Blood pressure is elevated today however 142/102. A recheck was 138/89.  11/03/2016  I saw Mr. Meek today for follow-up again. He is complaining of persistent chest pain which is chronic, tends to be present for most of the day at rest and with exertion. He also complains of shortness of breath. Surprisingly blood pressure is elevated today, much higher than it has been in the past at 150/105. He reports compliance with his medications. This could be contributing to his problems. He is interested in another heart catheterization. I did remind him that we performed a stress test only 3 months ago which was negative for ischemia. His last coronary intervention was about a year ago which she had some percutaneous angioplasty but no new stent placement. He has complex anatomy that was not amenable to PCI, rather medical therapy was recommended however he has been on maximal doses of nitrates, Ranexa, beta blocker and other blood pressure medications. Interestingly, despite a high dose of metoprolol, his heart rate remains up in the 80s. EKG today is nonischemic.  12/02/2016  Mr. Medlin returns today for follow-up. He reports despite changing his medications which has resulted in improvement in blood pressure  that he still has significant chest discomfort and shortness of breath. He is certain that there is a new blockage in his heart that the cause of this. He believes he needs another heart catheterization. Symptoms seem similar to his symptoms prior to his last cutting balloon angioplasty more than 2 years ago.  01/17/2017  Mr. Pavao seen today in follow-up. He underwent recent repeat coronary catheterization which showed no clear targets for intervention. He did have some small distal disease which was angioplastied and noted a small improvement in his symptoms. His nitroglycerin was increased which I think is also contributing to his improvement. At this point I think we need to continue with medical therapy. We could also possibly consider adding some pain medication, perhaps a neuropathic pain medicine is her may be a complex regional  pain syndrome or other component of recurrent chest pain beyond ischemia.  03/24/2017  Mr. Haeberle seen today in follow-up. He continues to have pain in the chest. I don't believe it's anginal. I suspect he might have a complex regional pain syndrome. He is taken gabapentin in the past without much success but does not take it regularly. He may benefit from stronger neuropathic pain medicine such as pregabalin. I discussed with the pharmacist today and we ran an interaction check with his current medications. Potential interaction his Prozac with a slight increased risk of serotonin syndrome. I advised him to monitor that and look for symptoms such as fever or muscle rigidity.  05/24/2017  Mr. Delano returns today for hospital follow-up. He was again in the hospital for chest pain. Cardiac enzymes were negative. He was seen by one of my partners and recommended follow-up with me. Mr. Bunn reports recurrent chest pain which seems to be nitrate responsive, despite being on high-dose indoor 90 mg every morning and 60 mg every afternoon, pregabalin, Ranexa thousand  milligrams twice a day, high-dose amlodipine, beta blocker, and other treatments for angina, with very well controlled LDL-C less than 60. He is currently on maximal medical therapy. In January he had PCI to the circumflex for in-stent restenosis with a suboptimal result. I reviewed his coronary anatomy today and cath films personally with Dr. Peter Martinique, to see if he was a candidate for chronic total occlusion PCI. His feeling is that there are no real percutaneous options for him. He had at least a diffuse area 50% stenosis of the circumflex with a small distal vessel, close to 50% mid to distal LAD disease and an ostial/proximally occluded RCA with very faint left to right collaterals. Based on these findings, surgical opinion was suggested - although targets are arguably small.   07/12/2017  Mr. Sallas was seen today in follow-up. He recently was seen in the emergency department for tooth and jaw pain. He has significant dental caries and will need global extraction. I've ready provided cardiac clearance for this which apparently can be done on aspirin and Plavix. He's also had back pain which has been chronic. I prefer him to see Dr. Lucianne Lei tried for surgical consultation however it is felt that he's not a candidate for bypass at this time unless he were to develop LAD disease. He has small vessels which are poor targets for grafting. Medical therapy is recommended.  10/17/2017  Mr. Rossbach returns today for follow-up.  He reports recurrent chest pain.  He said "I think it is time to go back to the Cath Lab again".  He is already on maximal medical therapy including long-acting nitrates, calcium channel blocker, beta-blocker and ranolazine.  During his last heart catheterization, there were no additional targets for revascularization.  He is not considered a good PCI candidate going forward.  He was referred to CT surgery for possible CABG evaluation and determined to have small vessels which were  ungraftable.  We have tried management of his pain symptoms with neuropathic medications for possible CPRS of the chest without much benefit.  He is recently been having some abdominal pain and a CT scan of the abdomen was ordered.  Because of a possible contrast reaction in the past, he is CT was canceled.  They suggested he will need premedication.  His symptoms seem to be much worse at exertion and relieved by rest.  02/27/2018  Mr. Speigner was seen today in follow-up.  He was recently seen  in the hospital for recurrent chest pain.  He ruled out for MI.  His pain seemed to be improved somewhat with tramadol.  It was felt that no further testing was necessary and he was discharged.  Today he reports his chest pain again is on and off.  Not necessarily associated with exertion or relieved by rest.  There is certainly a chest wall pain which may be complex regional pain syndrome, but he has experienced some exertional pain and shortness of breath.  Some of this could be angina.  I previously referred him for evaluation at [facility], since they are the only program in the area to have EECP.  It is felt that this might be helpful for chronic angina.  He says that he never received a call from their office to schedule an appointment.  This apparently is performed by Dr. Purcell Nails.  PMHx:  Past Medical History:  Diagnosis Date  . Anxiety   . CAD (coronary artery disease)    a. s/p multiple PCIs with last cath 11/2016 with severe multivessel CAD, s/p PCTA to LCx but unable to pass stent  . Chronic leg pain    bilateral  . Chronic lower back pain   . COPD (chronic obstructive pulmonary disease) ([place])   . Depression   . GERD (gastroesophageal reflux disease)    Takes Dexilant  . HLD (hyperlipidemia)   . Hypertension   . Rhabdomyolysis    h/o, r/t statins  . Sleep apnea    "can't tolerate mask" (12/16/2016)  . Type II diabetes mellitus ([place])     Past Surgical History:  Procedure Laterality Date  . BACK  SURGERY    . CARDIAC CATHETERIZATION N/A 09/25/2015   Procedure: Left Heart Cath and Coronary Angiography;  Surgeon: Leonie Man, MD;  Location: [facility];  Service: Cardiovascular;  Laterality: N/A;  . CARDIAC CATHETERIZATION N/A 12/16/2016   Procedure: Left Heart Cath and Coronary Angiography;  Surgeon: Leonie Man, MD;  Location: [facility];  Service: Cardiovascular;  Laterality: N/A;  . CARDIAC CATHETERIZATION N/A 12/16/2016   Procedure: Coronary Balloon Angioplasty;  Surgeon: Leonie Man, MD;  Location: [facility];  Service: Cardiovascular;  Laterality: N/A;  . COLONOSCOPY W/ POLYPECTOMY    . CORONARY ANGIOPLASTY  09/25/2015   mid cir & om  . CORONARY ANGIOPLASTY WITH STENT PLACEMENT  10/09/2001   PTCA & stenting of mid AV circumflex; 2.5x79m Pixel stent  . CORONARY ANGIOPLASTY WITH STENT PLACEMENT  12/13/2001   PCI with stent to mid L circumflex, 95% stenosis to 0% residual  . CORONARY ANGIOPLASTY WITH STENT PLACEMENT  10/10/2003   PCI to mid AV circumflex; LAD 30% disease; RCA 100% occluded prox.  . CORONARY ANGIOPLASTY WITH STENT PLACEMENT  09/01/2011   PCI with stenting with bare metal stent to mid AV groove circumflex and PDA  . CORONARY ANGIOPLASTY WITH STENT PLACEMENT  10/17/2011   cutting balloon angioplasty of ostial lateral OM1 branch and bifurcation AV groove circumflex OM junction; stenosis reduced to 0%  . EXCISIONAL HEMORRHOIDECTOMY    . LEFT HEART CATHETERIZATION WITH CORONARY ANGIOGRAM N/A 10/18/2011   Procedure: LEFT HEART CATHETERIZATION WITH CORONARY ANGIOGRAM;  Surgeon: DLeonie Man MD;  [facility];  Service: Cardiovascular;  Laterality: N/A;  . LUMBAR LAMINECTOMY/DECOMPRESSION MICRODISCECTOMY  03/31/2012   Procedure: LUMBAR LAMINECTOMY/DECOMPRESSION MICRODISCECTOMY 1 LEVEL;  Surgeon: HCharlie Pitter MD;  [facility];  Service: Neurosurgery;  Laterality: Left;  . TRANSTHORACIC  ECHOCARDIOGRAM  07/28/2011   EF  55-65%; LVH, grade 1 diastolic dysfunction;     FAMHx:  Family History  Problem Relation Age of Onset  . Heart attack Father   . Hypertension Mother   . Diabetes Mother   . Heart disease Brother        x 3   . Heart attack Brother        deceased  . Hypertension Sister   . Diabetes Sister   . Anesthesia problems Neg Hx   . Hypotension Neg Hx   . Malignant hyperthermia Neg Hx   . Pseudochol deficiency Neg Hx     SOCHx:   reports that he quit smoking about 2 years ago. His smoking use included cigarettes. He has a 6.25 pack-year smoking history. He has never used smokeless tobacco. He reports that he does not drink alcohol or use drugs.  ALLERGIES:  Allergies  Allergen Reactions  . Iohexol Anaphylaxis    PT. TO BE PREMEDICATED PRIOR TO IV CONTRAST PER DR Kris Hartmann /MMS//12/15/15Desc: PT BECAME SOB AND CHEST TIGHTNESS AFTER CONTRAST INJECTION.  [name],RT-RCT., Onset Date: [id number]     ROS: Pertinent items noted in HPI and remainder of comprehensive ROS otherwise negative.  HOME MEDS: Current Outpatient Medications  Medication Sig Dispense Refill  . acetaminophen-codeine (TYLENOL #3) 300-30 MG tablet Take 1 tablet every 12 (twelve) hours as needed by mouth for moderate pain. 60 tablet 1  . Albuterol Sulfate (PROAIR RESPICLICK) 505 (90 Base) MCG/ACT AEPB Inhale 1 puff into the lungs every 6 (six) hours as needed (shortness of breath). 3 each 1  . amLODipine (NORVASC) 10 MG tablet Take 1 tablet (10 mg total) daily by mouth. 30 tablet 6  . aspirin 81 MG tablet Take 1 tablet (81 mg total) daily by mouth. 30 tablet   . atorvastatin (LIPITOR) 40 MG tablet Take 1 tablet (40 mg total) daily by mouth. 90 tablet 1  . benzonatate (TESSALON) 100 MG capsule Take 1 capsule (100 mg total) by mouth every 8 (eight) hours. 21 capsule 0  . Blood Glucose Monitoring Suppl (ACCU-CHEK AVIVA PLUS) w/Device KIT Use as dircted 1 kit 0  . cetirizine (ZYRTEC) 10 MG tablet Take 1 tablet (10 mg  total) daily by mouth. 30 tablet 1  . clopidogrel (PLAVIX) 75 MG tablet Take 1 tablet (75 mg total) daily by mouth. 90 tablet 1  . diclofenac sodium (VOLTAREN) 1 % GEL Apply 2 g topically 4 (four) times daily. 1 Tube 0  . FLUoxetine (PROZAC) 20 MG capsule TAKE 1 TABLET BY MOUTH DAILY. 30 capsule 5  . fluticasone (FLONASE) 50 MCG/ACT nasal spray Place 2 sprays into both nostrils daily. (Patient taking differently: Place 2 sprays into both nostrils daily as needed for allergies. ) 16 g 6  . furosemide (LASIX) 20 MG tablet Take 20 mg by mouth 2 (two) times daily.    [place] Kitchen glucose blood (ACCU-CHEK AVIVA) test strip Use as instructed 100 each 12  . hydrALAZINE (APRESOLINE) 25 MG tablet TAKE 1 TABLET BY MOUTH 2 TIMES DAILY. 180 tablet 0  . hydrocortisone-pramoxine (ANALPRAM-HC) 2.5-1 % rectal cream PLACE 1 APPLICATION RECTALLY 3 (THREE) TIMES DAILY. 30 g 1  . isosorbide mononitrate (IMDUR) 120 MG 24 hr tablet Take 1 tablet (120 mg total) daily by mouth. 30 tablet 5  . losartan-hydrochlorothiazide (HYZAAR) 100-25 MG tablet Take 1 tablet by mouth daily. 90 tablet 1  . metFORMIN (GLUCOPHAGE) 1000 MG tablet Take 1 tablet (1,000 mg total) 2 (two) times  daily with a meal by mouth. 180 tablet 1  . mometasone (ASMANEX) 220 MCG/INH inhaler Inhale 2 puffs daily into the lungs. 3 Inhaler 1  . Nebivolol HCl 20 MG TABS Take 1 tablet (20 mg total) by mouth daily. 90 tablet 3  . nitroGLYCERIN (NITROSTAT) 0.4 MG SL tablet Place 1 tablet (0.4 mg total) under the tongue every 5 (five) minutes as needed for chest pain. 25 tablet 2  . oxyCODONE-acetaminophen (PERCOCET/ROXICET) 5-325 MG tablet Take 1-2 tablets by mouth every 4 (four) hours as needed for severe pain. 10 tablet 0  . pantoprazole (PROTONIX) 40 MG tablet Take 1 tablet (40 mg total) daily by mouth. 30 tablet 5  . pregabalin (LYRICA) 75 MG capsule Take 1 capsule (75 mg total) by mouth 3 (three) times daily. 270 capsule 3  . ranolazine (RANEXA) 1000 MG SR tablet Take 1  tablet (1,000 mg total) by mouth 2 (two) times daily. 180 tablet 3  . tamsulosin (FLOMAX) 0.4 MG CAPS capsule Take 1 capsule (0.4 mg total) daily by mouth. (Patient taking differently: Take 0.4 mg by mouth 2 (two) times daily. ) 30 capsule 5  . tiotropium (SPIRIVA HANDIHALER) 18 MCG inhalation capsule Place 1 capsule (18 mcg total) daily into inhaler and inhale. 30 capsule 5  . traZODone (DESYREL) 50 MG tablet Take 1 tablet (50 mg total) at bedtime as needed by mouth. for sleep 30 tablet 5  . traMADol (ULTRAM) 50 MG tablet Take 1 tablet (50 mg total) by mouth every 6 (six) hours as needed for severe pain. 30 tablet 1   No current facility-administered medications for this visit.     LABS/IMAGING: Results for orders placed or performed during the hospital encounter of 02/24/18 (from the past 48 hour(s))  Glucose, capillary     Status: Abnormal   Collection Time: 02/25/18  9:08 PM  Result Value Ref Range   Glucose-Capillary 120 (H) 65 - 99 mg/dL  Glucose, capillary     Status: None   Collection Time: 02/26/18  7:31 AM  Result Value Ref Range   Glucose-Capillary 99 65 - 99 mg/dL  Glucose, capillary     Status: None   Collection Time: 02/26/18 11:37 AM  Result Value Ref Range   Glucose-Capillary 92 65 - 99 mg/dL   No results found.  VITALS: BP (!) 134/93   Pulse 80   Ht 5' 9"  (1.753 m)   Wt 245 lb (111.1 kg)   SpO2 97% Comment: on room air  BMI 36.18 kg/m   EXAM: General appearance: alert and no distress Neck: no carotid bruit, no JVD and thyroid not enlarged, symmetric, no tenderness/mass/nodules Lungs: clear to auscultation bilaterally Heart: regular rate and rhythm, S1, S2 normal, no murmur, click, rub or gallop Abdomen: soft, non-tender; bowel sounds normal; no masses,  no organomegaly Extremities: extremities normal, atraumatic, no cyanosis or edema Pulses: 2+ and symmetric Skin: Skin color, texture, turgor normal. No rashes or lesions Neurologic: Grossly normal Psych:  Pleasant  EKG: Deferred  ASSESSMENT: 1. Recurrent chest pain and dyspnea at rest and with exertion - Occluded OM2 at a prior stent - no good revascularization options - low risk Myoview with no ischemia (07/2016) 2. Coronary artery disease status post PCI and cutting balloon angioplasty (11/2016) 3. Ongoing tobacco abuse 4. Uncontrolled hypertension 5. Dyslipidemia 6. 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 Persistent low back pain 8. Diabetes type 2 9. Morbid obesity 10. Anxiety/?PTSD 11. Acceptable risk for dental extraction  PLAN: 1.  Mr. Hase has had recurrent chest pain was just in the hospital.  He ruled out for MI.  He is reporting both in exertional chest pain which is recurrent as well as a pain which seems to be around the xiphoid process that is tender and reproducible.  This is not improved with neuropathic pain medicines.  He had some relief with tramadol therefore I will provide a supply today. He may need a referral to thoracic surgery for evaluation of sympathetic denervation of this area (if this is done locally) to help control pain symptoms. With regard to exertional pain, this certainly could be angina. No further interventional options are available. I will refer him back to Dr. Purcell Nails at [facility] for consideration of EECP therapy.  Follow-up in 3 months.  Pixie Casino, MD, [facility], [place] Director of the [facility]  Card[facility] Attending Cardiologist  Direct Dial: [phone]  Fax: [phone]  Website:  [email or web address] 02/27/2018, 5:14 PM

## 2018-02-28 ENCOUNTER — Telehealth: Payer: Self-pay | Admitting: Internal Medicine

## 2018-02-28 NOTE — Telephone Encounter (Signed)
New Message:    Pt says he needs prior authorization for his Tramadol please.

## 2018-02-28 NOTE — Telephone Encounter (Signed)
Faxed PA for tramadol to [phone] to [facility]

## 2018-02-28 NOTE — Telephone Encounter (Signed)
Returned call to patient. He reports his tramadol Rx needs an authorization. He took this Rx to [facility] on [address].   Called this pharmacy and patient needs prior authorization for tramadol d/t Medicaid. Per [facility] Medicaid & Health Choice Preferred Drug LIst, tramadol is a preferred drug.

## 2018-03-01 ENCOUNTER — Telehealth: Payer: Self-pay | Admitting: Internal Medicine

## 2018-03-01 DIAGNOSIS — I208 Other forms of angina pectoris: Secondary | ICD-10-CM

## 2018-03-01 DIAGNOSIS — R079 Chest pain, unspecified: Secondary | ICD-10-CM

## 2018-03-01 DIAGNOSIS — I202 Refractory angina pectoris: Secondary | ICD-10-CM

## 2018-03-01 NOTE — Telephone Encounter (Signed)
Patient called and made aware that a PA for tramadol was submitted on 4/9. Informed him that Medicaid does not send [place] a fax notification if the med is approved/denied. Advised he call his pharmacy on Friday or early this week to inquire about status of Rx

## 2018-03-01 NOTE — Telephone Encounter (Signed)
Patient states he cannot make it to the appointments with Dr. Dorathy Kinsman group for EECP therapy - he cannot go to all the appointments needed and he does not have a ride to the appointments in [place].   He also states he would be willing to proceed with the other procedure recommended by Dr. Debara Pickett (he is unsure of the name)  Routed to MD

## 2018-03-13 NOTE — Telephone Encounter (Signed)
I have tried to find an alternative for pain management -discussed with thoracic surgery, they do not perform the procedure we had discussed. The only other options could be considering a pain management referral. Not sure which providers will take him from an insurance standpoint.  Dr. Lemmie Evens

## 2018-03-14 ENCOUNTER — Ambulatory Visit: Payer: Medicaid Other | Admitting: Internal Medicine

## 2018-03-14 NOTE — Telephone Encounter (Signed)
Called patient with MD info. He needed to end call abruptly d/t family matter (brother passed away) and stated he will call back

## 2018-03-14 NOTE — Telephone Encounter (Signed)
Patient returned call. He agreed w/MD plan to proceed with pain management referral.

## 2018-03-14 NOTE — Telephone Encounter (Signed)
He may not be able to get to the pain clinic at [place] - I'm not sure PM&R will treat him. Any other options?  Dr. Lemmie Evens

## 2018-03-16 NOTE — Telephone Encounter (Signed)
That sounds fine .Marland Kitchen Give it a try.  Dr. Lemmie Evens

## 2018-03-16 NOTE — Telephone Encounter (Signed)
Referral placed to Dr. Delice Lesch

## 2018-03-23 ENCOUNTER — Encounter (HOSPITAL_COMMUNITY): Payer: Self-pay | Admitting: *Deleted

## 2018-03-23 ENCOUNTER — Emergency Department (HOSPITAL_COMMUNITY): Payer: Medicaid Other

## 2018-03-23 ENCOUNTER — Emergency Department (HOSPITAL_COMMUNITY)
Admission: EM | Admit: 2018-03-23 | Discharge: 2018-03-24 | Disposition: A | Discharge status: Home / Self Care | Payer: Medicaid Other | Attending: Emergency Medicine | Admitting: Emergency Medicine

## 2018-03-23 DIAGNOSIS — Z87891 Personal history of nicotine dependence: Secondary | ICD-10-CM | POA: Insufficient documentation

## 2018-03-23 DIAGNOSIS — M79601 Pain in right arm: Secondary | ICD-10-CM | POA: Insufficient documentation

## 2018-03-23 DIAGNOSIS — Z7982 Long term (current) use of aspirin: Secondary | ICD-10-CM | POA: Diagnosis not present

## 2018-03-23 DIAGNOSIS — I10 Essential (primary) hypertension: Secondary | ICD-10-CM | POA: Insufficient documentation

## 2018-03-23 DIAGNOSIS — J449 Chronic obstructive pulmonary disease, unspecified: Secondary | ICD-10-CM | POA: Insufficient documentation

## 2018-03-23 DIAGNOSIS — Z79899 Other long term (current) drug therapy: Secondary | ICD-10-CM | POA: Insufficient documentation

## 2018-03-23 DIAGNOSIS — E119 Type 2 diabetes mellitus without complications: Secondary | ICD-10-CM | POA: Diagnosis not present

## 2018-03-23 DIAGNOSIS — R0789 Other chest pain: Secondary | ICD-10-CM | POA: Diagnosis not present

## 2018-03-23 DIAGNOSIS — I2510 Atherosclerotic heart disease of native coronary artery without angina pectoris: Secondary | ICD-10-CM | POA: Diagnosis not present

## 2018-03-23 DIAGNOSIS — Z7984 Long term (current) use of oral hypoglycemic drugs: Secondary | ICD-10-CM | POA: Insufficient documentation

## 2018-03-23 DIAGNOSIS — M7918 Myalgia, other site: Secondary | ICD-10-CM | POA: Diagnosis not present

## 2018-03-23 NOTE — ED Triage Notes (Signed)
Pt c/o right shoulder pain x 1 week, denies trauma/injury.

## 2018-03-24 ENCOUNTER — Telehealth: Payer: Self-pay | Admitting: Internal Medicine

## 2018-03-24 MED ORDER — IBUPROFEN 600 MG PO TABS: 600.0000 mg | ORAL_TABLET | Freq: Four times a day (QID) | ORAL | 0 refills | Status: DC | PRN

## 2018-03-24 MED ORDER — OXYCODONE-ACETAMINOPHEN 5-325 MG PO TABS
1.0000 | ORAL_TABLET | Freq: Once | ORAL | Status: AC
  Administered 2018-03-24: 1 via ORAL
  Filled 2018-03-24: qty 1

## 2018-03-24 MED ORDER — OXYCODONE-ACETAMINOPHEN 5-325 MG PO TABS: 1.0000 | ORAL_TABLET | ORAL | 0 refills | Status: DC | PRN
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
## 2018-03-24 NOTE — Telephone Encounter (Signed)
Faxed PA for tramadol to [place] [facility] @ [phone] after receiving denial d/t different form (short-acting opiod analgesic) needing to be submitted vs general PA form.

## 2018-03-28 ENCOUNTER — Telehealth: Payer: Self-pay | Admitting: Internal Medicine

## 2018-03-28 NOTE — Telephone Encounter (Signed)
Patient was calling to follow up on status of referral to pain MD. Notified him that a referral was ordered on 03/16/18. Will have scheduler check on status of this for an update.

## 2018-03-28 NOTE — Telephone Encounter (Signed)
New message ° °Pt verbalized that he is returning call for RN °

## 2018-03-30 NOTE — Telephone Encounter (Addendum)
Update on referral per schedulers:   [facility] HAS SEEN THE REFERRAL AND THE NOTE STATES ALLOW 4-6 WEEKS FOR REVIEW/SCHEDULING _______________ Attempted to notify patient of this. His phone message states he is not accepting calls at this time.

## 2018-03-31 NOTE — Telephone Encounter (Signed)
Attempted to notify patient of update on pain center referral. Phone not accepting calls at this time

## 2018-04-05 ENCOUNTER — Other Ambulatory Visit: Payer: Self-pay | Admitting: Family Medicine

## 2018-04-05 ENCOUNTER — Telehealth: Payer: Self-pay | Admitting: Internal Medicine

## 2018-04-05 MED ORDER — TRAMADOL HCL 50 MG PO TABS: 50.0000 mg | ORAL_TABLET | Freq: Four times a day (QID) | ORAL | 1 refills | Status: DC | PRN

## 2018-04-05 NOTE — Telephone Encounter (Signed)
rx called in to preferred pharmacy.  Patient aware.

## 2018-04-05 NOTE — Addendum Note (Signed)
Addended by: Patria Mane A on: 04/05/2018 12:21 PM   Modules accepted: Orders

## 2018-04-05 NOTE — Telephone Encounter (Signed)
Pt is calling:    Pt was prescribed Tramadol and don't have any more left and want to know if he can get some more called in to. Please advise pt so he will know.  [facility]

## 2018-04-05 NOTE — Telephone Encounter (Signed)
Ok to refill.  Dr. Lemmie Evens

## 2018-04-12 ENCOUNTER — Ambulatory Visit: Payer: Medicaid Other | Admitting: Internal Medicine

## 2018-04-12 ENCOUNTER — Other Ambulatory Visit: Payer: Self-pay | Admitting: *Deleted

## 2018-04-12 DIAGNOSIS — K219 Gastro-esophageal reflux disease without esophagitis: Secondary | ICD-10-CM

## 2018-04-14 MED ORDER — PANTOPRAZOLE SODIUM 40 MG PO TBEC: 40.0000 mg | DELAYED_RELEASE_TABLET | Freq: Every day | ORAL | 5 refills | Status: DC

## 2018-04-18 ENCOUNTER — Encounter: Payer: Self-pay | Admitting: Family Medicine

## 2018-04-20 ENCOUNTER — Encounter: Payer: Self-pay | Admitting: Family Medicine

## 2018-04-20 ENCOUNTER — Ambulatory Visit: Payer: Medicaid Other | Attending: Family Medicine | Admitting: Family Medicine

## 2018-04-20 VITALS — BP 165/101 | HR 73 | Temp 98.1°F | Ht 69.0 in | Wt 244.6 lb

## 2018-04-20 DIAGNOSIS — E785 Hyperlipidemia, unspecified: Secondary | ICD-10-CM | POA: Diagnosis not present

## 2018-04-20 DIAGNOSIS — M25511 Pain in right shoulder: Secondary | ICD-10-CM | POA: Insufficient documentation

## 2018-04-20 DIAGNOSIS — Z79899 Other long term (current) drug therapy: Secondary | ICD-10-CM | POA: Diagnosis not present

## 2018-04-20 DIAGNOSIS — F331 Major depressive disorder, recurrent, moderate: Secondary | ICD-10-CM | POA: Diagnosis not present

## 2018-04-20 DIAGNOSIS — I10 Essential (primary) hypertension: Secondary | ICD-10-CM

## 2018-04-20 DIAGNOSIS — G8929 Other chronic pain: Secondary | ICD-10-CM

## 2018-04-20 DIAGNOSIS — F419 Anxiety disorder, unspecified: Secondary | ICD-10-CM | POA: Diagnosis not present

## 2018-04-20 DIAGNOSIS — Z79891 Long term (current) use of opiate analgesic: Secondary | ICD-10-CM | POA: Diagnosis not present

## 2018-04-20 DIAGNOSIS — Z7982 Long term (current) use of aspirin: Secondary | ICD-10-CM | POA: Insufficient documentation

## 2018-04-20 DIAGNOSIS — J449 Chronic obstructive pulmonary disease, unspecified: Secondary | ICD-10-CM | POA: Diagnosis not present

## 2018-04-20 DIAGNOSIS — Z7984 Long term (current) use of oral hypoglycemic drugs: Secondary | ICD-10-CM | POA: Diagnosis not present

## 2018-04-20 DIAGNOSIS — E870 Hyperosmolality and hypernatremia: Secondary | ICD-10-CM | POA: Insufficient documentation

## 2018-04-20 DIAGNOSIS — E119 Type 2 diabetes mellitus without complications: Secondary | ICD-10-CM | POA: Insufficient documentation

## 2018-04-20 DIAGNOSIS — E0800 Diabetes mellitus due to underlying condition with hyperosmolarity without nonketotic hyperglycemic-hyperosmolar coma (NKHHC): Secondary | ICD-10-CM

## 2018-04-20 DIAGNOSIS — Z955 Presence of coronary angioplasty implant and graft: Secondary | ICD-10-CM | POA: Diagnosis not present

## 2018-04-20 DIAGNOSIS — I25110 Atherosclerotic heart disease of native coronary artery with unstable angina pectoris: Secondary | ICD-10-CM | POA: Insufficient documentation

## 2018-04-20 DIAGNOSIS — Z9889 Other specified postprocedural states: Secondary | ICD-10-CM | POA: Insufficient documentation

## 2018-04-20 DIAGNOSIS — Z634 Disappearance and death of family member: Secondary | ICD-10-CM

## 2018-04-20 DIAGNOSIS — K219 Gastro-esophageal reflux disease without esophagitis: Secondary | ICD-10-CM | POA: Diagnosis not present

## 2018-04-20 LAB — POCT GLYCOSYLATED HEMOGLOBIN (HGB A1C): HbA1c, POC (prediabetic range): 5.8 % (ref 5.7–6.4)

## 2018-04-20 MED ORDER — HYDRALAZINE HCL 25 MG PO TABS: 25.0000 mg | ORAL_TABLET | Freq: Two times a day (BID) | ORAL | 1 refills | Status: DC

## 2018-04-20 MED ORDER — CLOPIDOGREL BISULFATE 75 MG PO TABS: 75.0000 mg | ORAL_TABLET | Freq: Every day | ORAL | 1 refills | Status: DC

## 2018-04-20 MED ORDER — TIOTROPIUM BROMIDE MONOHYDRATE 18 MCG IN CAPS: 18.0000 ug | ORAL_CAPSULE | Freq: Every day | RESPIRATORY_TRACT | 1 refills | Status: DC

## 2018-04-20 MED ORDER — PANTOPRAZOLE SODIUM 40 MG PO TBEC: 40.0000 mg | DELAYED_RELEASE_TABLET | Freq: Every day | ORAL | 1 refills | Status: DC

## 2018-04-20 MED ORDER — AMLODIPINE BESYLATE 10 MG PO TABS: 10.0000 mg | ORAL_TABLET | Freq: Every day | ORAL | 1 refills | Status: DC

## 2018-04-20 MED ORDER — METFORMIN HCL 1000 MG PO TABS: 1000.0000 mg | ORAL_TABLET | Freq: Two times a day (BID) | ORAL | 1 refills | Status: DC

## 2018-04-20 MED ORDER — ISOSORBIDE MONONITRATE ER 120 MG PO TB24: 120.0000 mg | ORAL_TABLET | Freq: Every day | ORAL | 1 refills | Status: DC

## 2018-04-20 MED ORDER — PANTOPRAZOLE SODIUM 40 MG PO TBEC: 40.0000 mg | DELAYED_RELEASE_TABLET | Freq: Two times a day (BID) | ORAL | 1 refills | Status: DC

## 2018-04-20 MED ORDER — MOMETASONE FUROATE 220 MCG/INH IN AEPB: 2.0000 | INHALATION_SPRAY | Freq: Every day | RESPIRATORY_TRACT | 1 refills | Status: DC

## 2018-04-20 MED ORDER — DICLOFENAC SODIUM 1 % TD GEL: 2.0000 g | Freq: Four times a day (QID) | TRANSDERMAL | 0 refills | Status: DC

## 2018-04-20 MED ORDER — LOSARTAN POTASSIUM-HCTZ 100-25 MG PO TABS: 1.0000 | ORAL_TABLET | Freq: Every day | ORAL | 1 refills | Status: DC

## 2018-04-20 MED ORDER — FLUOXETINE HCL 40 MG PO CAPS: 40.0000 mg | ORAL_CAPSULE | Freq: Every day | ORAL | 1 refills | Status: DC

## 2018-04-20 MED ORDER — ATORVASTATIN CALCIUM 40 MG PO TABS: 40.0000 mg | ORAL_TABLET | Freq: Every day | ORAL | 1 refills | Status: DC

## 2018-04-20 MED FILL — CLOPIDOGREL 75 MG TABLET: 75 | 30 days supply | Qty: 30 | Fill #0

## 2018-04-20 MED FILL — PANTOPRAZOLE SOD DR 40 MG T: 40 | 30 days supply | Qty: 60 | Fill #0

## 2018-04-20 MED FILL — AMLODIPINE BESYLATE 10 MG T: 10 | 30 days supply | Qty: 30 | Fill #0

## 2018-04-20 MED FILL — LOSARTAN-HCTZ 100-25 MG TAB: 100-25 | 30 days supply | Qty: 30 | Fill #0

## 2018-04-20 MED FILL — VOLTAREN 1% GEL: 1 | 12 days supply | Qty: 100 | Fill #0

## 2018-04-20 MED FILL — ISOSORBIDE MN 120 MG TAB SA: 120 | 30 days supply | Qty: 30 | Fill #0

## 2018-04-20 MED FILL — ATORVASTATIN CALCIUM 40 MG: 40 | 30 days supply | Qty: 30 | Fill #0

## 2018-04-20 MED FILL — metFORMIN HCL 1000 MG TABS: 1000 | 30 days supply | Qty: 60 | Fill #0

## 2018-04-20 MED FILL — FLUoxetine HCL 40 MG CAPS: 40 | 30 days supply | Qty: 30 | Fill #0

## 2018-04-20 MED FILL — hydrALAZINE HCL 25 MG TABS: 25 | 30 days supply | Qty: 60 | Fill #0

## 2018-04-20 NOTE — Progress Notes (Signed)
Subjective:  Patient ID: Andrew West, male    DOB: 12-Jan-1966  Age: 52 y.o. MRN: [id number]  CC: Hospitalization Follow-up   HPI Andrew West is a 52 y.o. with a  Medical history of Type 2 DM (A1c 5.8), CAD s/p PCI/stent to LCx and OM1 In 09/2015; cutting balloon angioplasty to OM 1, HLD, HTN, previous tobacco abuse, chronic back pain s/p laminectomy/discectomy (3 years ago by Dr Dutch Quint), depression who comes into the clinic for a follow-up visit. He has had several ED visits for chest pains which were thought to be chest wall related.  Last seen by his cardiologist, Dr. Rennis Golden on 02/27/2018 and referred to Dr. Lendon Colonel at [facility] for EECP due to ongoing angina as no further interventional options are available as per cardiology; he is yet to hear from [facility]. His last visit to the ED was on 03/23/2018 where he had presented with anterior right shoulder pain and chest x-ray revealed no acute or chest abnormality. He informs me pain has been present for the last couple of weeks, rated as a 9/10, is present all the time and does not radiate.  Symptoms have not been relieved by taking Tylenol or Voltaren gel.  He has run out of his Tylenol pills.  He denies a history of heavy lifting.  He complains his medication for depression is not working and he stays awake all night.  Of note he is on Prozac 20 mg and trazodone 50 mg.  He reports losing his older brother 2 weeks ago. Also requests increasing his Protonix to twice daily dosing as symptoms of reflux and uncontrolled on his daily dosing.  Past Medical History:  Diagnosis Date  . Anxiety   . CAD (coronary artery disease)    a. s/p multiple PCIs with last cath 11/2016 with severe multivessel CAD, s/p PCTA to LCx but unable to pass stent  . Chronic leg pain    bilateral  . Chronic lower back pain   . COPD (chronic obstructive pulmonary disease) (HCC)   . Depression   . GERD (gastroesophageal reflux disease)    Takes Dexilant  .  HLD (hyperlipidemia)   . Hypertension   . Rhabdomyolysis    h/o, r/t statins  . Sleep apnea    "can't tolerate mask" (12/16/2016)  . Type II diabetes mellitus (HCC)     Past Surgical History:  Procedure Laterality Date  . BACK SURGERY    . CARDIAC CATHETERIZATION N/A 09/25/2015   Procedure: Left Heart Cath and Coronary Angiography;  Surgeon: Marykay Lex, MD;  Location: [facility] INVASIVE CV LAB;  Service: Cardiovascular;  Laterality: N/A;  . CARDIAC CATHETERIZATION N/A 12/16/2016   Procedure: Left Heart Cath and Coronary Angiography;  Surgeon: Marykay Lex, MD;  Location: [facility] INVASIVE CV LAB;  Service: Cardiovascular;  Laterality: N/A;  . CARDIAC CATHETERIZATION N/A 12/16/2016   Procedure: Coronary Balloon Angioplasty;  Surgeon: Marykay Lex, MD;  Location: [facility] INVASIVE CV LAB;  Service: Cardiovascular;  Laterality: N/A;  . COLONOSCOPY W/ POLYPECTOMY    . CORONARY ANGIOPLASTY  09/25/2015   mid cir & om  . CORONARY ANGIOPLASTY WITH STENT PLACEMENT  10/09/2001   PTCA & stenting of mid AV circumflex; 2.5x75mm Pixel stent  . CORONARY ANGIOPLASTY WITH STENT PLACEMENT  12/13/2001   PCI with stent to mid L circumflex, 95% stenosis to 0% residual  . CORONARY ANGIOPLASTY WITH STENT PLACEMENT  10/10/2003   PCI to mid AV circumflex; LAD 30% disease; RCA 100%  occluded prox.  . CORONARY ANGIOPLASTY WITH STENT PLACEMENT  09/01/2011   PCI with stenting with bare metal stent to mid AV groove circumflex and PDA  . CORONARY ANGIOPLASTY WITH STENT PLACEMENT  10/17/2011   cutting balloon angioplasty of ostial lateral OM1 branch and bifurcation AV groove circumflex OM junction; stenosis reduced to 0%  . EXCISIONAL HEMORRHOIDECTOMY    . LEFT HEART CATHETERIZATION WITH CORONARY ANGIOGRAM N/A 10/18/2011   Procedure: LEFT HEART CATHETERIZATION WITH CORONARY ANGIOGRAM;  Surgeon: Marykay Lex, MD;  Location: [facility] CATH LAB;  Service: Cardiovascular;  Laterality: N/A;  . LUMBAR LAMINECTOMY/DECOMPRESSION  MICRODISCECTOMY  03/31/2012   Procedure: LUMBAR LAMINECTOMY/DECOMPRESSION MICRODISCECTOMY 1 LEVEL;  Surgeon: Temple Pacini, MD;  Location: [facility] NEURO ORS;  Service: Neurosurgery;  Laterality: Left;  . TRANSTHORACIC ECHOCARDIOGRAM  07/28/2011   EF 55-65%; LVH, grade 1 diastolic dysfunction;     Allergies  Allergen Reactions  . Iohexol Anaphylaxis    PT. TO BE PREMEDICATED PRIOR TO IV CONTRAST PER DR Eppie Gibson /MMS//12/15/15Desc: PT BECAME SOB AND CHEST TIGHTNESS AFTER CONTRAST INJECTION.  [name],RT-RCT., Onset Date: [id number]      Outpatient Medications Prior to Visit  Medication Sig Dispense Refill  . acetaminophen-codeine (TYLENOL #3) 300-30 MG tablet Take 1 tablet every 12 (twelve) hours as needed by mouth for moderate pain. 60 tablet 1  . Albuterol Sulfate (PROAIR RESPICLICK) 108 (90 Base) MCG/ACT AEPB Inhale 1 puff into the lungs every 6 (six) hours as needed (shortness of breath). 3 each 1  . aspirin 81 MG tablet Take 1 tablet (81 mg total) daily by mouth. 30 tablet   . Blood Glucose Monitoring Suppl (ACCU-CHEK AVIVA PLUS) w/Device KIT Use as dircted 1 kit 0  . cetirizine (ZYRTEC) 10 MG tablet Take 1 tablet (10 mg total) daily by mouth. 30 tablet 1  . fluticasone (FLONASE) 50 MCG/ACT nasal spray Place 2 sprays into both nostrils daily. (Patient taking differently: Place 2 sprays into both nostrils daily as needed for allergies. ) 16 g 6  . furosemide (LASIX) 20 MG tablet Take 20 mg by mouth 2 (two) times daily.    Marland Kitchen glucose blood (ACCU-CHEK AVIVA) test strip Use as instructed 100 each 12  . hydrocortisone-pramoxine (ANALPRAM-HC) 2.5-1 % rectal cream PLACE 1 APPLICATION RECTALLY 3 (THREE) TIMES DAILY. 30 g 1  . ibuprofen (ADVIL,MOTRIN) 600 MG tablet Take 1 tablet (600 mg total) by mouth every 6 (six) hours as needed. 30 tablet 0  . Nebivolol HCl 20 MG TABS Take 1 tablet (20 mg total) by mouth daily. 90 tablet 3  . nitroGLYCERIN (NITROSTAT) 0.4 MG SL tablet Place 1 tablet (0.4 mg total)  under the tongue every 5 (five) minutes as needed for chest pain. 25 tablet 2  . pregabalin (LYRICA) 75 MG capsule Take 1 capsule (75 mg total) by mouth 3 (three) times daily. 270 capsule 3  . ranolazine (RANEXA) 1000 MG SR tablet Take 1 tablet (1,000 mg total) by mouth 2 (two) times daily. 180 tablet 3  . tamsulosin (FLOMAX) 0.4 MG CAPS capsule Take 1 capsule (0.4 mg total) daily by mouth. (Patient taking differently: Take 0.4 mg by mouth 2 (two) times daily. ) 30 capsule 5  . traMADol (ULTRAM) 50 MG tablet Take 1 tablet (50 mg total) by mouth every 6 (six) hours as needed for severe pain. 30 tablet 1  . traZODone (DESYREL) 50 MG tablet Take 1 tablet (50 mg total) at bedtime as needed by mouth. for sleep 30 tablet 5  .  amLODipine (NORVASC) 10 MG tablet Take 1 tablet (10 mg total) daily by mouth. 30 tablet 6  . atorvastatin (LIPITOR) 40 MG tablet Take 1 tablet (40 mg total) daily by mouth. 90 tablet 1  . clopidogrel (PLAVIX) 75 MG tablet Take 1 tablet (75 mg total) daily by mouth. 90 tablet 1  . diclofenac sodium (VOLTAREN) 1 % GEL Apply 2 g topically 4 (four) times daily. 1 Tube 0  . FLUoxetine (PROZAC) 20 MG capsule TAKE 1 TABLET BY MOUTH DAILY. 30 capsule 5  . hydrALAZINE (APRESOLINE) 25 MG tablet TAKE 1 TABLET BY MOUTH 2 TIMES DAILY. 180 tablet 0  . isosorbide mononitrate (IMDUR) 120 MG 24 hr tablet Take 1 tablet (120 mg total) daily by mouth. 30 tablet 5  . losartan-hydrochlorothiazide (HYZAAR) 100-25 MG tablet Take 1 tablet by mouth daily. 90 tablet 1  . metFORMIN (GLUCOPHAGE) 1000 MG tablet Take 1 tablet (1,000 mg total) 2 (two) times daily with a meal by mouth. 180 tablet 1  . mometasone (ASMANEX) 220 MCG/INH inhaler Inhale 2 puffs daily into the lungs. 3 Inhaler 1  . pantoprazole (PROTONIX) 40 MG tablet Take 1 tablet (40 mg total) by mouth daily. 30 tablet 5  . tiotropium (SPIRIVA HANDIHALER) 18 MCG inhalation capsule Place 1 capsule (18 mcg total) daily into inhaler and inhale. 30 capsule  5  . benzonatate (TESSALON) 100 MG capsule Take 1 capsule (100 mg total) by mouth every 8 (eight) hours. (Patient not taking: Reported on 04/20/2018) 21 capsule 0  . oxyCODONE-acetaminophen (PERCOCET/ROXICET) 5-325 MG tablet Take 1 tablet by mouth every 4 (four) hours as needed for severe pain. (Patient not taking: Reported on 04/20/2018) 6 tablet 0   No facility-administered medications prior to visit.     ROS Review of Systems  Constitutional: Negative for activity change and appetite change.  HENT: Negative for sinus pressure and sore throat.   Eyes: Negative for visual disturbance.  Respiratory: Negative for cough, chest tightness and shortness of breath.   Cardiovascular: Negative for chest pain and leg swelling.  Gastrointestinal: Negative for abdominal distention, abdominal pain, constipation and diarrhea.  Endocrine: Negative.   Genitourinary: Negative for dysuria.  Musculoskeletal: Negative for joint swelling and myalgias.  Skin: Negative for rash.  Allergic/Immunologic: Negative.   Neurological: Negative for weakness, light-headedness and numbness.  Psychiatric/Behavioral: Negative for dysphoric mood and suicidal ideas.    Objective:  BP (!) 165/101   Pulse 73   Temp 98.1 F (36.7 C) (Oral)   Ht 5\' 9"  (1.753 m)   Wt 244 lb 9.6 oz (110.9 kg)   SpO2 98%   BMI 36.12 kg/m   BP/Weight 04/20/2018 03/24/2018 03/23/2018  Systolic BP 165 125 -  Diastolic BP 101 91 -  Wt. (Lbs) 244.6 - 239.1  BMI 36.12 - 36.36      Physical Exam  Constitutional: He is oriented to person, place, and time. He appears well-developed and well-nourished.  Cardiovascular: Normal rate, normal heart sounds and intact distal pulses.  No murmur heard. Pulmonary/Chest: Effort normal and breath sounds normal. He has no wheezes. He has no rales. He exhibits no tenderness.  Abdominal: Soft. Bowel sounds are normal. He exhibits no distension and no mass. There is no tenderness.  Musculoskeletal: He  exhibits tenderness (TTP of anterior aspect of R shoulder.; abduction restricted to 90 degrees, limited forward extension).  Neurological: He is alert and oriented to person, place, and time.  Skin: Skin is warm and dry.  Psychiatric: He has a normal mood  and affect.       CMP Latest Ref Rng & Units 02/25/2018 02/24/2018 02/24/2018  Glucose 65 - 99 mg/dL - - 76  BUN 6 - 20 mg/dL - - 7  Creatinine 7.82 - 1.24 mg/dL 9.56 - 2.13  Sodium 086 - 145 mmol/L - - 140  Potassium 3.5 - 5.1 mmol/L - 3.7 3.7  Chloride 101 - 111 mmol/L - - 109  CO2 22 - 32 mmol/L - - 24  Calcium 8.9 - 10.3 mg/dL - - 7.8(L)  Total Protein 6.5 - 8.1 g/dL - - -  Total Bilirubin 0.3 - 1.2 mg/dL - - -  Alkaline Phos 38 - 126 U/L - - -  AST 15 - 41 U/L - - -  ALT 17 - 63 U/L - - -    Lipid Panel     Component Value Date/Time   CHOL 106 03/15/2017 0940   TRIG 84 03/15/2017 0940   HDL 29 (L) 03/15/2017 0940   CHOLHDL 3.7 03/15/2017 0940   CHOLHDL 5.2 (H) 09/21/2016 0900   VLDL 31 (H) 09/21/2016 0900   LDLCALC 60 03/15/2017 0940   Lab Results  Component Value Date   HGBA1C 5.8 04/20/2018     Assessment & Plan:   1. Essential hypertension Uncontrolled; elevation likely due to pain Low-sodium diet - amLODipine (NORVASC) 10 MG tablet; Take 1 tablet (10 mg total) by mouth daily.  Dispense: 90 tablet; Refill: 1 - losartan-hydrochlorothiazide (HYZAAR) 100-25 MG tablet; Take 1 tablet by mouth daily.  Dispense: 90 tablet; Refill: 1  2. Presence of stent in left circumflex coronary artery Continues to have intermittent unstable angina Last seen by cardiology in 02/27/2018 and he was referred to [facility] for EECP - atorvastatin (LIPITOR) 40 MG tablet; Take 1 tablet (40 mg total) by mouth daily.  Dispense: 90 tablet; Refill: 1 - clopidogrel (PLAVIX) 75 MG tablet; Take 1 tablet (75 mg total) by mouth daily.  Dispense: 90 tablet; Refill: 1 - hydrALAZINE (APRESOLINE) 25 MG tablet; Take 1 tablet (25 mg total) by mouth 2  (two) times daily.  Dispense: 180 tablet; Refill: 1 - isosorbide mononitrate (IMDUR) 120 MG 24 hr tablet; Take 1 tablet (120 mg total) by mouth daily.  Dispense: 90 tablet; Refill: 1  3. Moderate episode of recurrent major depressive disorder (HCC) Uncontrolled;currently exacerbated by recent bereavement Increased dose of Prozac and trazodone Declines counseling with LCSW - FLUoxetine (PROZAC) 40 MG capsule; Take 1 capsule (40 mg total) by mouth daily.  Dispense: 90 capsule; Refill: 1  4. Diabetes mellitus due to underlying condition with hyperosmolarity without coma, without long-term current use of insulin (HCC) Controlled with A1c of 5.8 Diabetic diet - CMP14+EGFR; Future - Lipid panel; Future - Microalbumin/Creatinine Ratio, Urine; Future - metFORMIN (GLUCOPHAGE) 1000 MG tablet; Take 1 tablet (1,000 mg total) by mouth 2 (two) times daily with a meal.  Dispense: 180 tablet; Refill: 1  5. COPD with asthma (HCC) Stable No recent exacerbation - mometasone (ASMANEX) 220 MCG/INH inhaler; Inhale 2 puffs into the lungs daily.  Dispense: 3 Inhaler; Refill: 1 - tiotropium (SPIRIVA HANDIHALER) 18 MCG inhalation capsule; Place 1 capsule (18 mcg total) into inhaler and inhale daily.  Dispense: 90 capsule; Refill: 1  6. Gastroesophageal reflux disease without esophagitis Uncontrolled on daily dosing of Protonix Will increase to twice daily dosing - pantoprazole (PROTONIX) 40 MG tablet; Take 1 tablet (40 mg total) by mouth daily.  Dispense: 180 tablet; Refill: 1  7. Chronic right shoulder pain X-ray  unrevealing May benefit from cortisone injections - diclofenac sodium (VOLTAREN) 1 % GEL; Apply 2 g topically 4 (four) times daily.  Dispense: 1 Tube; Refill: 0 - AMB referral to orthopedics  8. Bereavement Recently lost his brother Advised him to grief counseling which he is not open to at this time   Meds ordered this encounter  Medications  . diclofenac sodium (VOLTAREN) 1 % GEL    Sig:  Apply 2 g topically 4 (four) times daily.    Dispense:  1 Tube    Refill:  0  . amLODipine (NORVASC) 10 MG tablet    Sig: Take 1 tablet (10 mg total) by mouth daily.    Dispense:  90 tablet    Refill:  1  . atorvastatin (LIPITOR) 40 MG tablet    Sig: Take 1 tablet (40 mg total) by mouth daily.    Dispense:  90 tablet    Refill:  1  . clopidogrel (PLAVIX) 75 MG tablet    Sig: Take 1 tablet (75 mg total) by mouth daily.    Dispense:  90 tablet    Refill:  1  . FLUoxetine (PROZAC) 40 MG capsule    Sig: Take 1 capsule (40 mg total) by mouth daily.    Dispense:  90 capsule    Refill:  1    Discontinue previous dose  . hydrALAZINE (APRESOLINE) 25 MG tablet    Sig: Take 1 tablet (25 mg total) by mouth 2 (two) times daily.    Dispense:  180 tablet    Refill:  1  . isosorbide mononitrate (IMDUR) 120 MG 24 hr tablet    Sig: Take 1 tablet (120 mg total) by mouth daily.    Dispense:  90 tablet    Refill:  1    Discontinue previous dose  . losartan-hydrochlorothiazide (HYZAAR) 100-25 MG tablet    Sig: Take 1 tablet by mouth daily.    Dispense:  90 tablet    Refill:  1  . metFORMIN (GLUCOPHAGE) 1000 MG tablet    Sig: Take 1 tablet (1,000 mg total) by mouth 2 (two) times daily with a meal.    Dispense:  180 tablet    Refill:  1  . mometasone (ASMANEX) 220 MCG/INH inhaler    Sig: Inhale 2 puffs into the lungs daily.    Dispense:  3 Inhaler    Refill:  1  . pantoprazole (PROTONIX) 40 MG tablet    Sig: Take 1 tablet (40 mg total) by mouth daily.    Dispense:  180 tablet    Refill:  1  . tiotropium (SPIRIVA HANDIHALER) 18 MCG inhalation capsule    Sig: Place 1 capsule (18 mcg total) into inhaler and inhale daily.    Dispense:  90 capsule    Refill:  1    Follow-up: Return in about 3 months (around 07/21/2018) for follow up of chronic medical conditions.   [name] MD

## 2018-04-20 NOTE — Patient Instructions (Signed)
Shoulder Pain Many things can cause shoulder pain, including:  An injury.  Moving the arm in the same way again and again (overuse).  Joint pain (arthritis).  Follow these instructions at home: Take these actions to help with your pain:  Squeeze a soft ball or a foam pad as much as you can. This helps to prevent swelling. It also makes the arm stronger.  Take over-the-counter and prescription medicines only as told by your doctor.  If told, put ice on the area: ? Put ice in a plastic bag. ? Place a towel between your skin and the bag. ? Leave the ice on for 20 minutes, 2-3 times per day. Stop putting on ice if it does not help with the pain.  If you were given a shoulder sling or immobilizer: ? Wear it as told. ? Remove it to shower or bathe. ? Move your arm as little as possible. ? Keep your hand moving. This helps prevent swelling.  Contact a doctor if:  Your pain gets worse.  Medicine does not help your pain.  You have new pain in your arm, hand, or fingers. Get help right away if:  Your arm, hand, or fingers: ? Tingle. ? Are numb. ? Are swollen. ? Are painful. ? Turn white or blue. This information is not intended to replace advice given to you by your health care provider. Make sure you discuss any questions you have with your health care provider. Document Released: 04/26/2008 Document Revised: 07/04/2016 Document Reviewed: 03/03/2015 Elsevier Interactive Patient Education  2018 Elsevier Inc.  

## 2018-04-27 ENCOUNTER — Ambulatory Visit (INDEPENDENT_AMBULATORY_CARE_PROVIDER_SITE_OTHER): Payer: Medicaid Other | Admitting: Orthopaedic Surgery

## 2018-05-04 ENCOUNTER — Ambulatory Visit (INDEPENDENT_AMBULATORY_CARE_PROVIDER_SITE_OTHER): Payer: Medicaid Other | Admitting: Orthopaedic Surgery

## 2018-05-11 ENCOUNTER — Other Ambulatory Visit: Payer: Self-pay | Admitting: Internal Medicine

## 2018-05-11 ENCOUNTER — Telehealth: Payer: Self-pay | Admitting: Internal Medicine

## 2018-05-11 MED ORDER — TRAMADOL HCL 50 MG PO TABS: 50.0000 mg | ORAL_TABLET | Freq: Four times a day (QID) | ORAL | 1 refills | Status: DC | PRN

## 2018-05-11 NOTE — Telephone Encounter (Signed)
Called Dr. Serita Grit office and their office has called the patient multiple times and the cell number the patient has listed is not working.

## 2018-05-11 NOTE — Telephone Encounter (Signed)
Medication samples have been provided to the patient.  Drug name: bystolic 20mg   Qty: 3 bottles  LOT: [id number]  Exp.Date: 12/2019  Samples left at front desk for patient pick-up. Patient notified.  Sheral Apley M 11:14 AM 05/11/2018

## 2018-05-11 NOTE — Telephone Encounter (Signed)
°*  STAT* If patient is at the pharmacy, call can be transferred to refill team.   1. Which medications need to be refilled? (please list name of each medication and dose if known) Tramadol  2. Which pharmacy/location (including street and city if local pharmacy) is medication to be sent to?[phone]  3. Do they need a 30 day or 90 day supply? 60 and refills

## 2018-05-11 NOTE — Telephone Encounter (Signed)
Rx phoned in.   

## 2018-05-11 NOTE — Telephone Encounter (Signed)
Patient calling the office for samples of medication:   1.  What medication and dosage are you requesting samples for? Bystolic  2.  Are you currently out of this medication? yes

## 2018-06-02 ENCOUNTER — Telehealth: Payer: Self-pay | Admitting: Internal Medicine

## 2018-06-02 NOTE — Telephone Encounter (Signed)
New Message    Pt c/o medication issue:  1. Name of Medication: Beta Blocker, patient throw away bottle and not sure of the name of medication  2. How are you currently taking this medication (dosage and times per day)? Once daily  3. Are you having a reaction (difficulty breathing--STAT)? Yes, sharp pain in heart  4. What is your medication issue? Patient is out, need samples, insurance will not pay for medication,

## 2018-06-02 NOTE — Telephone Encounter (Signed)
Received call from patient c/o of continued SOB and CP. He states the SOB has increased and he is having sharp pain in his heart.   He states he takes a NTG and this goes away.   Can happen with rest or exertion.   Pain can also increase with palpation, makes a sharp pain go down his arm.   BP this AM 136/98, did not take HR.  He states the tramadol is no longer helping his pain.   He is also out of Bystolic and has been for 2-3 days.   He states insurance will not cover this.   Advised samples are avaliable and will place at front desk for pick up.  Asked patient if he has been contacted by [facility] as referred by Dr. Debara Pickett for EECP and he states he has not been contacted for this.   Advised would route to MD to review.

## 2018-06-05 NOTE — Telephone Encounter (Signed)
I don't think that EECP is an option for him due to insurance issues. He did have a pain clinic referral, did he go?  Dr. Lemmie Evens

## 2018-06-06 ENCOUNTER — Encounter: Payer: Self-pay | Admitting: Physical Medicine & Rehabilitation

## 2018-06-06 NOTE — Telephone Encounter (Signed)
Patient scheduled to see Dr. Posey Pronto 06/30/18

## 2018-06-06 NOTE — Telephone Encounter (Signed)
Called patient and he states he has not yet been seen in pain clinic. Explained that per note, Dr. Serita Grit office had tried to call him multiple times. Had previously provided patient with their office number as well. Offered again to patient, but he is driving. He will call back to obtain their number: [phone]

## 2018-06-06 NOTE — Telephone Encounter (Signed)
Patient returned call. Provided him with Dr. Serita Grit phone #. He will call his office

## 2018-06-07 ENCOUNTER — Telehealth: Payer: Self-pay | Admitting: Internal Medicine

## 2018-06-07 MED ORDER — TRAMADOL HCL 50 MG PO TABS: 50.0000 mg | ORAL_TABLET | Freq: Four times a day (QID) | ORAL | 0 refills | Status: DC | PRN

## 2018-06-07 MED ORDER — TRAMADOL HCL 50 MG PO TABS: 50.0000 mg | ORAL_TABLET | Freq: Four times a day (QID) | ORAL | 1 refills | Status: DC | PRN

## 2018-06-07 NOTE — Telephone Encounter (Signed)
Ok to refill tramadol once (no refills) - but he did say recently it was not working for him? Encourage pain management clinic follow-up and they can determine whether to continue with it or something else.  Dr. Lemmie Evens

## 2018-06-07 NOTE — Telephone Encounter (Signed)
Spoke with pt, he was referred to dr patel and they told him to call and get his most recent medication list sent to them. Per voicemail at dr patel's office, updated med list and dr hilty's last office note placed in medical records for faxing to [phone]. The patient is also asking for a refill for his tramadol. Will forward to dr hilty for ok to fill.

## 2018-06-07 NOTE — Telephone Encounter (Signed)
New Message    Pt states he is calling to speak with a nurse and doesn't want to disclose reason. Please call

## 2018-06-07 NOTE — Telephone Encounter (Signed)
Refill sent to the pharmacy electronically.  

## 2018-06-09 ENCOUNTER — Telehealth: Payer: Self-pay | Admitting: Internal Medicine

## 2018-06-09 MED ORDER — TRAMADOL HCL 50 MG PO TABS: 50.0000 mg | ORAL_TABLET | Freq: Four times a day (QID) | ORAL | 0 refills | Status: DC | PRN

## 2018-06-09 NOTE — Telephone Encounter (Signed)
Per chart review, med was called in to [facility]. Called this pharmacy, they have not filled this med. Advised to remove from profile. [facility]

## 2018-06-09 NOTE — Telephone Encounter (Signed)
New Message:        *STAT* If patient is at the pharmacy, call can be transferred to refill team.   1. Which medications need to be refilled? (please list name of each medication and dose if known) traMADol (ULTRAM) 50 MG tablet  2. Which pharmacy/location (including street and city if local pharmacy) is medication to be sent to?[id number] [employer] [place] - [address] AT [place]  3. Do they need a 30 day or 90 day supply? 97    Pt states we sent this in for him on the 17th but he believes he may have told us the wrong pharmacy

## 2018-06-30 ENCOUNTER — Encounter: Payer: Self-pay | Admitting: Physical Medicine & Rehabilitation

## 2018-06-30 ENCOUNTER — Encounter: Payer: Medicaid Other | Attending: Physical Medicine & Rehabilitation | Admitting: Physical Medicine & Rehabilitation

## 2018-06-30 VITALS — BP 128/82 | HR 70 | Ht 68.0 in | Wt 239.0 lb

## 2018-06-30 DIAGNOSIS — M545 Low back pain: Secondary | ICD-10-CM | POA: Diagnosis not present

## 2018-06-30 DIAGNOSIS — R0789 Other chest pain: Secondary | ICD-10-CM | POA: Diagnosis not present

## 2018-06-30 DIAGNOSIS — G479 Sleep disorder, unspecified: Secondary | ICD-10-CM | POA: Diagnosis not present

## 2018-06-30 DIAGNOSIS — F419 Anxiety disorder, unspecified: Secondary | ICD-10-CM | POA: Diagnosis not present

## 2018-06-30 DIAGNOSIS — M5010 Cervical disc disorder with radiculopathy, unspecified cervical region: Secondary | ICD-10-CM | POA: Diagnosis not present

## 2018-06-30 DIAGNOSIS — E539 Vitamin B deficiency, unspecified: Secondary | ICD-10-CM | POA: Diagnosis not present

## 2018-06-30 DIAGNOSIS — I2510 Atherosclerotic heart disease of native coronary artery without angina pectoris: Secondary | ICD-10-CM | POA: Diagnosis not present

## 2018-06-30 DIAGNOSIS — I252 Old myocardial infarction: Secondary | ICD-10-CM | POA: Insufficient documentation

## 2018-06-30 DIAGNOSIS — I202 Refractory angina pectoris: Secondary | ICD-10-CM

## 2018-06-30 DIAGNOSIS — I208 Other forms of angina pectoris: Secondary | ICD-10-CM | POA: Diagnosis not present

## 2018-06-30 DIAGNOSIS — E538 Deficiency of other specified B group vitamins: Secondary | ICD-10-CM

## 2018-06-30 DIAGNOSIS — R072 Precordial pain: Secondary | ICD-10-CM

## 2018-06-30 DIAGNOSIS — Z87891 Personal history of nicotine dependence: Secondary | ICD-10-CM | POA: Diagnosis not present

## 2018-06-30 DIAGNOSIS — F329 Major depressive disorder, single episode, unspecified: Secondary | ICD-10-CM | POA: Insufficient documentation

## 2018-06-30 DIAGNOSIS — G8929 Other chronic pain: Secondary | ICD-10-CM | POA: Insufficient documentation

## 2018-06-30 DIAGNOSIS — E785 Hyperlipidemia, unspecified: Secondary | ICD-10-CM | POA: Diagnosis not present

## 2018-06-30 DIAGNOSIS — I10 Essential (primary) hypertension: Secondary | ICD-10-CM | POA: Diagnosis not present

## 2018-06-30 DIAGNOSIS — Z9861 Coronary angioplasty status: Secondary | ICD-10-CM

## 2018-06-30 DIAGNOSIS — J449 Chronic obstructive pulmonary disease, unspecified: Secondary | ICD-10-CM | POA: Insufficient documentation

## 2018-06-30 DIAGNOSIS — K219 Gastro-esophageal reflux disease without esophagitis: Secondary | ICD-10-CM | POA: Diagnosis not present

## 2018-06-30 DIAGNOSIS — E1140 Type 2 diabetes mellitus with diabetic neuropathy, unspecified: Secondary | ICD-10-CM | POA: Diagnosis not present

## 2018-06-30 DIAGNOSIS — E1142 Type 2 diabetes mellitus with diabetic polyneuropathy: Secondary | ICD-10-CM

## 2018-06-30 MED ORDER — PREGABALIN 150 MG PO CAPS: 150.0000 mg | ORAL_CAPSULE | Freq: Three times a day (TID) | ORAL | 1 refills | Status: DC

## 2018-06-30 MED ORDER — BACLOFEN 10 MG PO TABS
10.0000 mg | ORAL_TABLET | Freq: Three times a day (TID) | ORAL | 1 refills | Status: DC
Start: 2018-06-30 — End: 2018-10-11

## 2018-06-30 MED ORDER — CYANOCOBALAMIN 500 MCG PO TABS: 500.0000 ug | ORAL_TABLET | Freq: Every day | ORAL | 1 refills | Status: AC

## 2018-06-30 NOTE — Progress Notes (Signed)
Subjective:    Patient ID: Andrew West, male    DOB: 09-25-1966, 52 y.o.   MRN: [id number]  HPI 52 y/o male with pmh of DM, peripheral neuropathy, lumbar discitis, cervical, OSA, HTN, GERD, CAD with MI, chronic low back pain present with chest wall pain.  Started ~2018.  He does note 3 heart attacks with several stents and interventions, most recently around 2017/2018.  Nitroglycerin helps but causes headache.  Denies exacerbating factors, however, per report exertion exacerbates.  Radiates to left arm.  Constant.  Associated numbness, tingling, weakness.  Percocet helps.  Nitro, oxycodone/oxycontine, Tylenol #3 help.  Tramadol, Gabapentin, Lyrica does not help.  Denies falls.  Pain limites endurance activities.    Pain Inventory Average Pain 8 Pain Right Now 8 My pain is sharp, tingling and aching  In the last 24 hours, has pain interfered with the following? General activity 9 Relation with others 9 Enjoyment of life 9 What TIME of day is your pain at its worst? all Sleep (in general) na  Pain is worse with: walking, bending, sitting, standing and some activites Pain improves with: medication Relief from Meds: 5  Mobility walk without assistance ability to climb steps?  no do you drive?  no  Function disabled: date disabled 2015 I need assistance with the following:  household duties and shopping  Neuro/Psych bladder control problems weakness numbness tremor tingling trouble walking dizziness confusion depression anxiety loss of taste or smell suicidal thoughts - States did have suicidal thoughts but did not make plan  Prior Studies New patient  Physicians involved in your care New patient   Family History  Problem Relation Age of Onset  . Heart attack Father   . Hypertension Mother   . Diabetes Mother   . Heart disease Brother        x 3   . Heart attack Brother        deceased  . Hypertension Sister   . Diabetes Sister   . Anesthesia problems  Neg Hx   . Hypotension Neg Hx   . Malignant hyperthermia Neg Hx   . Pseudochol deficiency Neg Hx    Social History   Socioeconomic History  . Marital status: Married    Spouse name: Not on file  . Number of children: Not on file  . Years of education: Not on file  . Highest education level: Not on file  Occupational History  . Not on file  Social Needs  . Financial resource strain: Not on file  . Food insecurity:    Worry: Not on file    Inability: Not on file  . Transportation needs:    Medical: Not on file    Non-medical: Not on file  Tobacco Use  . Smoking status: Former Smoker    Packs/day: 0.25    Years: 25.00    Pack years: 6.25    Types: Cigarettes    Last attempt to quit: 05/24/2015    Years since quitting: 3.1  . Smokeless tobacco: Never Used  Substance and Sexual Activity  . Alcohol use: No    Alcohol/week: 0.0 standard drinks  . Drug use: No  . Sexual activity: Yes  Lifestyle  . Physical activity:    Days per week: Not on file    Minutes per session: Not on file  . Stress: Not on file  Relationships  . Social connections:    Talks on phone: Not on file    Gets together: Not on file  Attends religious service: Not on file    Active member of club or organization: Not on file    Attends meetings of clubs or organizations: Not on file    Relationship status: Not on file  Other Topics Concern  . Not on file  Social History Narrative  . Not on file   Past Surgical History:  Procedure Laterality Date  . BACK SURGERY    . CARDIAC CATHETERIZATION N/A 09/25/2015   Procedure: Left Heart Cath and Coronary Angiography;  Surgeon: Marykay Lex, MD;  Location: [facility] INVASIVE CV LAB;  Service: Cardiovascular;  Laterality: N/A;  . CARDIAC CATHETERIZATION N/A 12/16/2016   Procedure: Left Heart Cath and Coronary Angiography;  Surgeon: Marykay Lex, MD;  Location: [facility] INVASIVE CV LAB;  Service: Cardiovascular;  Laterality: N/A;  . CARDIAC CATHETERIZATION N/A  12/16/2016   Procedure: Coronary Balloon Angioplasty;  Surgeon: Marykay Lex, MD;  Location: [facility] INVASIVE CV LAB;  Service: Cardiovascular;  Laterality: N/A;  . COLONOSCOPY W/ POLYPECTOMY    . CORONARY ANGIOPLASTY  09/25/2015   mid cir & om  . CORONARY ANGIOPLASTY WITH STENT PLACEMENT  10/09/2001   PTCA & stenting of mid AV circumflex; 2.5x16mm Pixel stent  . CORONARY ANGIOPLASTY WITH STENT PLACEMENT  12/13/2001   PCI with stent to mid L circumflex, 95% stenosis to 0% residual  . CORONARY ANGIOPLASTY WITH STENT PLACEMENT  10/10/2003   PCI to mid AV circumflex; LAD 30% disease; RCA 100% occluded prox.  . CORONARY ANGIOPLASTY WITH STENT PLACEMENT  09/01/2011   PCI with stenting with bare metal stent to mid AV groove circumflex and PDA  . CORONARY ANGIOPLASTY WITH STENT PLACEMENT  10/17/2011   cutting balloon angioplasty of ostial lateral OM1 branch and bifurcation AV groove circumflex OM junction; stenosis reduced to 0%  . EXCISIONAL HEMORRHOIDECTOMY    . LEFT HEART CATHETERIZATION WITH CORONARY ANGIOGRAM N/A 10/18/2011   Procedure: LEFT HEART CATHETERIZATION WITH CORONARY ANGIOGRAM;  Surgeon: Marykay Lex, MD;  Location: [facility] CATH LAB;  Service: Cardiovascular;  Laterality: N/A;  . LUMBAR LAMINECTOMY/DECOMPRESSION MICRODISCECTOMY  03/31/2012   Procedure: LUMBAR LAMINECTOMY/DECOMPRESSION MICRODISCECTOMY 1 LEVEL;  Surgeon: Temple Pacini, MD;  Location: [facility] NEURO ORS;  Service: Neurosurgery;  Laterality: Left;  . TRANSTHORACIC ECHOCARDIOGRAM  07/28/2011   EF 55-65%; LVH, grade 1 diastolic dysfunction;    Past Medical History:  Diagnosis Date  . Anxiety   . CAD (coronary artery disease)    a. s/p multiple PCIs with last cath 11/2016 with severe multivessel CAD, s/p PCTA to LCx but unable to pass stent  . Chronic leg pain    bilateral  . Chronic lower back pain   . COPD (chronic obstructive pulmonary disease) (HCC)   . Depression   . GERD (gastroesophageal reflux disease)    Takes Dexilant  .  HLD (hyperlipidemia)   . Hypertension   . Rhabdomyolysis    h/o, r/t statins  . Sleep apnea    "can't tolerate mask" (12/16/2016)  . Type II diabetes mellitus (HCC)    BP 128/82   Pulse 70   Ht 5\' 8"  (1.727 m)   Wt 239 lb (108.4 kg)   SpO2 95%   BMI 36.34 kg/m   Opioid Risk Score:   Fall Risk Score:  `1  Depression screen PHQ 2/9  Depression screen [facility] 2/9 10/31/2015 10/01/2015 10/01/2015  Decreased Interest 0 1 1  Down, Depressed, Hopeless 0 1 1  PHQ - 2 Score 0 2 2  Altered sleeping -  3 -  Tired, decreased energy - 2 -  Change in appetite - 3 -  Feeling bad or failure about yourself  - 3 -  Trouble concentrating - 3 -  Moving slowly or fidgety/restless - 3 -  Suicidal thoughts - 3 -  PHQ-9 Score - 22 -  Some recent data might be hidden     Review of Systems  Constitutional: Positive for appetite change, diaphoresis and unexpected weight change.  HENT: Negative.        History of URIs  Eyes: Negative.   Respiratory: Positive for apnea, cough, shortness of breath and wheezing.        Sleep apnea - cpap  Cardiovascular: Negative.   Gastrointestinal: Positive for nausea.  Endocrine: Negative.   Genitourinary: Positive for difficulty urinating.  Musculoskeletal: Positive for gait problem.  Skin: Negative.   Allergic/Immunologic: Negative.   Neurological: Positive for dizziness, tremors, weakness and numbness.  Hematological: Bruises/bleeds easily.  Psychiatric/Behavioral: Positive for confusion, dysphoric mood and suicidal ideas. The patient is nervous/anxious.        States did have suicidal thoughts but did not make plan  All other systems reviewed and are negative.      Objective:   Physical Exam Gen: NAD. Vital signs reviewed HENT: Normocephalic, Atraumatic Eyes: EOMI. No discharge.  Cardio: RRR. No JVD. Pulm: B/l clear to auscultation.  Effort normal Abd: Soft, BS+ MSK:  Gait WNL.     No edema.  Neuro: CN II-XII grossly intact.    Sensation  diminished to light touch T4 dermatomal distrubution  Reflexes 2+ throughout  Strength  5/5 in all UE myotomes  Hyperalgesia chest wall Skin: Scaring right forearm    Assessment & Plan:  52 y/o male with pmh of DM, peripheral neuropathy, lumbar discitis, cervical, OSA, HTN, GERD, CAD with MI, chronic low back pain present with chest wall pain.  1. Chronic chest wall pain  Multifactorial Cardiac + MSK +/- Neurology  CT 2017 reviewed, limited.    Labs reviewed  Referral information reviewed  PMAWARE reviewed  Heat, Gabapentin, Robaxin, Flexaril, Tramdol ineffective  Trial cold pack  Will consider Voltaren gel/Lidoderm  Will consider TENS  Will increase Lyrica to 150 TID  Will order Baclofen 10 TID PRN  Will consider Cymbalta  Will consider referral to Psychology  Will consider Accupuncture  Will consider CT cervical myelogram  Will order Vit D/Mag  Cont follow up with Cards with plans for [facility]  Several    2. Sleep disturbance  Will consider Elavil 10 in future  3. Vitamin B12 deficiency  Will order daily supplement  4. Diabetic neuropathy  See #1

## 2018-06-30 NOTE — Progress Notes (Signed)
UDS not ordered by order of the provider.

## 2018-07-04 ENCOUNTER — Other Ambulatory Visit: Payer: Self-pay

## 2018-07-04 ENCOUNTER — Telehealth: Payer: Self-pay | Admitting: Internal Medicine

## 2018-07-04 ENCOUNTER — Encounter (HOSPITAL_COMMUNITY): Payer: Self-pay | Admitting: Emergency Medicine

## 2018-07-04 ENCOUNTER — Emergency Department (HOSPITAL_COMMUNITY)
Admission: EM | Admit: 2018-07-04 | Discharge: 2018-07-04 | Disposition: A | Discharge status: Home / Self Care | Payer: Medicaid Other | Attending: Emergency Medicine | Admitting: Emergency Medicine

## 2018-07-04 ENCOUNTER — Emergency Department (HOSPITAL_COMMUNITY): Payer: Medicaid Other

## 2018-07-04 DIAGNOSIS — Z79899 Other long term (current) drug therapy: Secondary | ICD-10-CM | POA: Insufficient documentation

## 2018-07-04 DIAGNOSIS — Z7902 Long term (current) use of antithrombotics/antiplatelets: Secondary | ICD-10-CM | POA: Insufficient documentation

## 2018-07-04 DIAGNOSIS — R079 Chest pain, unspecified: Secondary | ICD-10-CM | POA: Diagnosis present

## 2018-07-04 DIAGNOSIS — I10 Essential (primary) hypertension: Secondary | ICD-10-CM | POA: Insufficient documentation

## 2018-07-04 DIAGNOSIS — R0789 Other chest pain: Secondary | ICD-10-CM | POA: Diagnosis not present

## 2018-07-04 DIAGNOSIS — R0602 Shortness of breath: Secondary | ICD-10-CM | POA: Insufficient documentation

## 2018-07-04 DIAGNOSIS — Z87891 Personal history of nicotine dependence: Secondary | ICD-10-CM | POA: Diagnosis not present

## 2018-07-04 DIAGNOSIS — E119 Type 2 diabetes mellitus without complications: Secondary | ICD-10-CM | POA: Insufficient documentation

## 2018-07-04 DIAGNOSIS — J449 Chronic obstructive pulmonary disease, unspecified: Secondary | ICD-10-CM | POA: Diagnosis not present

## 2018-07-04 DIAGNOSIS — I2510 Atherosclerotic heart disease of native coronary artery without angina pectoris: Secondary | ICD-10-CM | POA: Diagnosis not present

## 2018-07-04 DIAGNOSIS — Z7982 Long term (current) use of aspirin: Secondary | ICD-10-CM | POA: Insufficient documentation

## 2018-07-04 DIAGNOSIS — Z955 Presence of coronary angioplasty implant and graft: Secondary | ICD-10-CM | POA: Diagnosis not present

## 2018-07-04 DIAGNOSIS — Z7984 Long term (current) use of oral hypoglycemic drugs: Secondary | ICD-10-CM | POA: Diagnosis not present

## 2018-07-04 LAB — BASIC METABOLIC PANEL
ANION GAP: 10 (ref 5–15)
BUN: 6 mg/dL (ref 6–20)
CHLORIDE: 104 mmol/L (ref 98–111)
CO2: 25 mmol/L (ref 22–32)
Calcium: 8.6 mg/dL — ABNORMAL LOW (ref 8.9–10.3)
Creatinine, Ser: 0.81 mg/dL (ref 0.61–1.24)
GFR calc non Af Amer: >60 mL/min (ref >60)
Glucose, Bld: 187 mg/dL — ABNORMAL HIGH (ref 70–99)
POTASSIUM: 3.2 mmol/L — AB (ref 3.5–5.1)
SODIUM: 139 mmol/L (ref 135–145)

## 2018-07-04 LAB — CBC
HCT: 44 % (ref 39.0–52.0)
HEMOGLOBIN: 13.9 g/dL (ref 13.0–17.0)
MCH: 26.1 pg (ref 26.0–34.0)
MCHC: 31.6 g/dL (ref 30.0–36.0)
MCV: 82.6 fL (ref 78.0–100.0)
PLATELETS: 230 10*3/uL (ref 150–400)
RBC: 5.33 MIL/uL (ref 4.22–5.81)
RDW: 14.1 % (ref 11.5–15.5)
WBC: 5.9 10*3/uL (ref 4.0–10.5)

## 2018-07-04 LAB — I-STAT TROPONIN, ED
TROPONIN I, POC: 0 ng/mL (ref 0.00–0.08)
TROPONIN I, POC: 0 ng/mL (ref 0.00–0.08)

## 2018-07-04 MED ORDER — GI COCKTAIL ~~LOC~~
30.0000 mL | Freq: Once | ORAL | Status: AC
  Administered 2018-07-04: 30 mL via ORAL
  Filled 2018-07-04: qty 30

## 2018-07-04 MED ORDER — FENTANYL CITRATE (PF) 100 MCG/2ML IJ SOLN
50.0000 ug | Freq: Once | INTRAMUSCULAR | Status: AC
  Administered 2018-07-04: 50 ug via INTRAVENOUS
  Filled 2018-07-04: qty 2

## 2018-07-04 NOTE — Telephone Encounter (Signed)
Spoke to patient. Patient states left side pain radiated to center chest . Patient states he has taken 2 ntg  @5  min apart with small relief . Patient states he has not taken a 3 rd tablet.  RN INQUIRED TO PATIENT IS THIS  PAIN DIFFERENT THEN PAIN FROM 06/30/18 when he had an appointment pain management clinic. RN informed patient to hang up and call EMS TO GOT ER. Patient in agreement ,states he will call and call his daughter. RN  Informed patient to call EMS FIRST and then call daughter. Patient states okay.

## 2018-07-04 NOTE — ED Notes (Signed)
Pt verbalized understanding of d/c instructions and has no further questions, VSS, NAD. Pt to follow up with cardiologist and pain management.

## 2018-07-04 NOTE — ED Provider Notes (Signed)
[facility] EMERGENCY DEPARTMENT Provider Note   CSN: [id number] Arrival date & time: 07/04/18  2979     History   Chief Complaint Chief Complaint  Patient presents with  . Chest Pain    HPI Andrew West is a 52 y.o. male who presents the emergency department with chief complaint of chest pain.  He has a past medical history of coronary artery disease and chronic chest wall pain.  Patient states he has pain all the time but it got worse this past Sunday and has been constant since that time.  He states at times it is better or worse and he is unsure what makes it better or worse.  He denies exertional chest pain.  The pain seems to radiate retrosternally he complains of some shortness of breath and states that it feels like his previous "heart attacks."  The patient had some relief with sublingual nitroglycerin.  He got 3 doses prior to arrival in the emergency department however is still in significant pain.  He has a history of reflux.  He denies nausea vomiting diaphoresis, hematemesis.  Review of EMR shows that the patient has had a referral to pain management clinic for his chronic chest pain.  HPI  Past Medical History:  Diagnosis Date  . Anxiety   . CAD (coronary artery disease)    a. s/p multiple PCIs with last cath 11/2016 with severe multivessel CAD, s/p PCTA to LCx but unable to pass stent  . Chronic leg pain    bilateral  . Chronic lower back pain   . COPD (chronic obstructive pulmonary disease) ([place])   . Depression   . GERD (gastroesophageal reflux disease)    Takes Dexilant  . HLD (hyperlipidemia)   . Hypertension   . Rhabdomyolysis    h/o, r/t statins  . Sleep apnea    "can't tolerate mask" (12/16/2016)  . Type II diabetes mellitus [facility])     Patient Active Problem List   Diagnosis Date Noted  . Diabetic peripheral neuropathy ([place]) 06/30/2018  . Substernal chest pain 02/25/2018  . Benign prostatic hyperplasia with urinary hesitancy  11/17/2017  . Preoperative cardiovascular examination 07/12/2017  . Refractory angina ([place]) 05/24/2017  . Complex regional pain syndrome type I 03/24/2017  . Progressive angina ([place]) -Class III 12/16/2016  . Anxiety 06/28/2016  . Diastolic dysfunction-grade 2 with EF 60-65% Oct 2016 03/22/2016  . Hypertension 10/03/2015  . CAD S/P multiple PCI's 10/03/2015  . Pulmonary nodule 06/24/2015  . Cough 06/24/2015  . COPD with asthma ([place]) 06/24/2015  . Reactive airway disease 06/04/2015  . DOE (dyspnea on exertion) 04/15/2015  . Lumbar disc herniation with radiculopathy 03/31/2012  . Presence of stent in left circumflex coronary artery 10/18/2011    Class: History of  . TOBACCO ABUSE 02/27/2009  . ABDOMINAL PAIN, LEFT LOWER QUADRANT 12/30/2008  . ABDOMINAL PAIN, EPIGASTRIC 12/05/2008  . Depression 09/05/2008  . Hereditary and idiopathic peripheral neuropathy 09/05/2008  . GERD 09/05/2008  . Cervical disc disorder with radiculopathy of cervical region 08/19/2008  . HLD (hyperlipidemia) 04/25/2008    Class: Diagnosis of  . ALLERGIC RHINITIS 04/25/2008  . Backache 04/25/2008  . Chest pain, unspecified 04/25/2008  . COLONIC POLYPS, HX OF 04/25/2008  . Obesity 04/18/2008    Class: Diagnosis of  . ANAL FISSURE, HX OF 04/18/2008  . Diabetes mellitus ([place]) 01/30/2008    Class: History of  . RECTAL BLEEDING 01/30/2008    Past Surgical History:  Procedure Laterality Date  .  BACK SURGERY    . CARDIAC CATHETERIZATION N/A 09/25/2015   Procedure: Left Heart Cath and Coronary Angiography;  Surgeon: Leonie Man, MD;  Location: [facility] CV LAB;  Service: Cardiovascular;  Laterality: N/A;  . CARDIAC CATHETERIZATION N/A 12/16/2016   Procedure: Left Heart Cath and Coronary Angiography;  Surgeon: Leonie Man, MD;  Location: [facility] CV LAB;  Service: Cardiovascular;  Laterality: N/A;  . CARDIAC CATHETERIZATION N/A 12/16/2016   Procedure: Coronary Balloon Angioplasty;  Surgeon: Leonie Man, MD;  Location: [facility] CV LAB;  Service: Cardiovascular;  Laterality: N/A;  . COLONOSCOPY W/ POLYPECTOMY    . CORONARY ANGIOPLASTY  09/25/2015   mid cir & om  . CORONARY ANGIOPLASTY WITH STENT PLACEMENT  10/09/2001   PTCA & stenting of mid AV circumflex; 2.5x8m Pixel stent  . CORONARY ANGIOPLASTY WITH STENT PLACEMENT  12/13/2001   PCI with stent to mid L circumflex, 95% stenosis to 0% residual  . CORONARY ANGIOPLASTY WITH STENT PLACEMENT  10/10/2003   PCI to mid AV circumflex; LAD 30% disease; RCA 100% occluded prox.  . CORONARY ANGIOPLASTY WITH STENT PLACEMENT  09/01/2011   PCI with stenting with bare metal stent to mid AV groove circumflex and PDA  . CORONARY ANGIOPLASTY WITH STENT PLACEMENT  10/17/2011   cutting balloon angioplasty of ostial lateral OM1 branch and bifurcation AV groove circumflex OM junction; stenosis reduced to 0%  . EXCISIONAL HEMORRHOIDECTOMY    . LEFT HEART CATHETERIZATION WITH CORONARY ANGIOGRAM N/A 10/18/2011   Procedure: LEFT HEART CATHETERIZATION WITH CORONARY ANGIOGRAM;  Surgeon: DLeonie Man MD;  [facility];  Service: Cardiovascular;  Laterality: N/A;  . LUMBAR LAMINECTOMY/DECOMPRESSION MICRODISCECTOMY  03/31/2012   Procedure: LUMBAR LAMINECTOMY/DECOMPRESSION MICRODISCECTOMY 1 LEVEL;  Surgeon: HCharlie Pitter MD;  [facility];  Service: Neurosurgery;  Laterality: Left;  . TRANSTHORACIC ECHOCARDIOGRAM  07/28/2011   EF 55-65%; LVH, grade 1 diastolic dysfunction;         Home Medications    Prior to Admission medications   Medication Sig Start Date End Date Taking? Authorizing Provider  Albuterol Sulfate (PROAIR RESPICLICK) 1109(90 Base) MCG/ACT AEPB Inhale 1 puff into the lungs every 6 (six) hours as needed (shortness of breath). 04/08/17  Yes NCharlott Rakes MD  amLODipine (NORVASC) 10 MG tablet Take 1 tablet (10 mg total) by mouth daily. 04/20/18  Yes NCharlott Rakes MD  aspirin 81 MG tablet Take 1 tablet (81 mg total)  daily by mouth. 10/10/17  Yes Newlin, Enobong, MD  atorvastatin (LIPITOR) 40 MG tablet Take 1 tablet (40 mg total) by mouth daily. 04/20/18  Yes NCharlott Rakes MD  baclofen (LIORESAL) 10 MG tablet Take 1 tablet (10 mg total) by mouth 3 (three) times daily. 06/30/18  Yes PJamse Arn MD  cetirizine (ZYRTEC) 10 MG tablet Take 1 tablet (10 mg total) daily by mouth. 10/10/17  Yes Newlin, Enobong, MD  clopidogrel (PLAVIX) 75 MG tablet Take 1 tablet (75 mg total) by mouth daily. 04/20/18  Yes NCharlott Rakes MD  diclofenac sodium (VOLTAREN) 1 % GEL Apply 2 g topically 4 (four) times daily. 04/20/18  Yes NCharlott Rakes MD  FLUoxetine (PROZAC) 40 MG capsule Take 1 capsule (40 mg total) by mouth daily. 04/20/18  Yes Newlin, ECharlane Ferretti MD  fluticasone (FLONASE) 50 MCG/ACT nasal spray Place 2 sprays into both nostrils daily. Patient taking differently: Place 2 sprays into both nostrils daily as needed for allergies.  02/24/17  Yes MArgentina Donovan PA-C  furosemide (  LASIX) 20 MG tablet Take 20 mg by mouth as needed.    Yes [provider]  hydrALAZINE (APRESOLINE) 25 MG tablet Take 1 tablet (25 mg total) by mouth 2 (two) times daily. 04/20/18  Yes Newlin, Charlane Ferretti, MD  hydrocortisone-pramoxine (ANALPRAM-HC) 2.5-1 % rectal cream PLACE 1 APPLICATION RECTALLY 3 (THREE) TIMES DAILY. Patient taking differently: Place 1 application rectally as needed.  08/25/17  Yes [name], MD  isosorbide mononitrate (IMDUR) 120 MG 24 hr tablet Take 1 tablet (120 mg total) by mouth daily. Patient taking differently: Take 60 mg by mouth 2 (two) times daily.  04/20/18  Yes Newlin, Charlane Ferretti, MD  losartan-hydrochlorothiazide (HYZAAR) 100-25 MG tablet Take 1 tablet by mouth daily. 04/20/18  Yes [name], MD  metFORMIN (GLUCOPHAGE) 1000 MG tablet Take 1 tablet (1,000 mg total) by mouth 2 (two) times daily with a meal. 04/20/18  Yes Newlin, Enobong, MD  mometasone (ASMANEX) 220 MCG/INH inhaler Inhale 2 puffs into the  lungs daily. 04/20/18  Yes [name], MD  Nebivolol HCl 20 MG TABS Take 1 tablet (20 mg total) by mouth daily. 01/26/18  Yes Hilty, Nadean Corwin, MD  nitroGLYCERIN (NITROSTAT) 0.4 MG SL tablet Place 1 tablet (0.4 mg total) under the tongue every 5 (five) minutes as needed for chest pain. 12/24/16  Yes Strader, [place] M, PA-C  pantoprazole (PROTONIX) 40 MG tablet Take 1 tablet (40 mg total) by mouth 2 (two) times daily. 04/20/18  Yes [name], MD  pregabalin (LYRICA) 150 MG capsule Take 1 capsule (150 mg total) by mouth 3 (three) times daily. 06/30/18  Yes Jamse Arn, MD  ranolazine (RANEXA) 1000 MG SR tablet Take 1 tablet (1,000 mg total) by mouth 2 (two) times daily. Patient taking differently: Take 1,000 mg by mouth daily.  05/24/17  Yes Hilty, Nadean Corwin, MD  tiotropium (SPIRIVA HANDIHALER) 18 MCG inhalation capsule Place 1 capsule (18 mcg total) into inhaler and inhale daily. 04/20/18  Yes [name], MD  traZODone (DESYREL) 50 MG tablet Take 1 tablet (50 mg total) at bedtime as needed by mouth. for sleep 10/10/17  Yes Newlin, Enobong, MD  valsartan-hydrochlorothiazide (DIOVAN-HCT) 320-25 MG tablet Take 1 tablet by mouth daily.   Yes [provider]  benzonatate (TESSALON) 100 MG capsule Take 1 capsule (100 mg total) by mouth every 8 (eight) hours. Patient not taking: Reported on 07/04/2018 01/17/18   Larene Pickett, PA-C  Blood Glucose Monitoring Suppl (ACCU-CHEK AVIVA PLUS) w/Device KIT Use as dircted 07/12/17   [name], MD  glucose blood (ACCU-CHEK AVIVA) test strip Use as instructed 07/12/17   [name], MD  tamsulosin (FLOMAX) 0.4 MG CAPS capsule Take 1 capsule (0.4 mg total) daily by mouth. Patient not taking: Reported on 07/04/2018 10/10/17   [name], MD  traMADol (ULTRAM) 50 MG tablet Take 1 tablet (50 mg total) by mouth every 6 (six) hours as needed for severe pain. Patient not taking: Reported on 07/04/2018 06/09/18   Pixie Casino, MD  vitamin  B-12 (CYANOCOBALAMIN) 500 MCG tablet Take 1 tablet (500 mcg total) by mouth daily. 06/30/18   Jamse Arn, MD    Family History Family History  Problem Relation Age of Onset  . Heart attack Father   . Hypertension Mother   . Diabetes Mother   . Heart disease Brother        x 3   . Heart attack Brother        deceased  . Hypertension Sister   .  Diabetes Sister   . Anesthesia problems Neg Hx   . Hypotension Neg Hx   . Malignant hyperthermia Neg Hx   . Pseudochol deficiency Neg Hx     Social History Social History   Tobacco Use  . Smoking status: Former Smoker    Packs/day: 0.25    Years: 25.00    Pack years: 6.25    Types: Cigarettes    Last attempt to quit: 05/24/2015    Years since quitting: 3.1  . Smokeless tobacco: Never Used  Substance Use Topics  . Alcohol use: No    Alcohol/week: 0.0 standard drinks  . Drug use: No     Allergies   Iohexol   Review of Systems Review of Systems  Ten systems reviewed and are negative for acute change, except as noted in the HPI.   Physical Exam Updated Vital Signs BP (!) 128/92   Pulse 66   Temp 98.3 F (36.8 C) (Oral)   Resp (!) 22   Ht _0  (1.727 m)   Wt 108.4 kg   SpO2 98%   BMI 36.34 kg/m   Physical Exam Physical Exam  Nursing note and vitals reviewed. Constitutional: He appears well-developed and well-nourished. No distress.  HENT:  Head: Normocephalic and atraumatic.  Eyes: Conjunctivae normal are normal. No scleral icterus.  Neck: Normal range of motion. Neck supple.  Cardiovascular: Normal rate, regular rhythm and normal heart sounds.   Pulmonary/Chest: Effort normal and breath sounds normal. No respiratory distress.  Abdominal: Soft. There is no tenderness.  Musculoskeletal: He exhibits no edema.  Neurological: He is alert.  Skin: Skin is warm and dry. He is not diaphoretic.  Psychiatric: His behavior is normal.     ED Treatments / Results  Labs (all labs ordered are listed, but only  abnormal results are displayed) Labs Reviewed  BASIC METABOLIC PANEL - Abnormal; Notable for the following components:      Result Value   Potassium 3.2 (*)    Glucose, Bld 187 (*)    Calcium 8.6 (*)    All other components within normal limits  CBC  I-STAT TROPONIN, ED    EKG EKG Interpretation  Date/Time:  Tuesday July 04 2018 09:57:28 EDT Ventricular Rate:  70 PR Interval:    QRS Duration: 113 QT Interval:  415 QTC Calculation: 448 R Axis:   62 Text Interpretation:  Sinus rhythm Left atrial enlargement Borderline intraventricular conduction delay Minimal ST elevation, anterior leads No acute changes Nonspecific ST and T wave abnormality Confirmed by Varney Biles [phone]) on 07/04/2018 10:45:56 AM   Radiology Dg Chest 2 View  Result Date: 07/04/2018 CLINICAL DATA:  Chest pain short of breath EXAM: CHEST - 2 VIEW COMPARISON:  02/24/2018 FINDINGS: Heart size and vascularity within normal limits. Negative for infiltrate or effusion. Prominent lung markings bilaterally suggesting chronic lung disease. Right lower paratracheal soft tissue density is new and may represent engorged azygos vein. Mass or adenopathy less likely. Follow-up recommended. Atherosclerotic aortic arch IMPRESSION: Negative for heart failure or pneumonia Prominent soft tissues right lower paratracheal region possibly and cord azygos vein. Follow-up chest x-ray suggested. Electronically Signed   By: Franchot Gallo M.D.   On: 07/04/2018 10:34    Procedures Procedures (including critical care time)  Medications Ordered in ED Medications - No data to display   Initial Impression / Assessment and Plan / ED Course  I have reviewed the triage vital signs and the nursing notes.  Pertinent labs & imaging results that were  available during my care of the patient were reviewed by me and considered in my medical decision making (see chart for details).     Patient here with complaint of chest pain.  He has a past  medical history of chronic chest pain and pain syndromes.  He has had persistent chest pain for greater than 48 hours without break and in consideration of acute coronary syndrome to suspect he would have an elevated troponin at this point.  Patient denies exertional dyspnea, diaphoresis, nausea or other signs and symptoms of acute coronary syndrome.  His EKG is unchanged from previous the previous.  He has been seen in the outpatient setting for chronic chest pain by pain specialist. He has no unilateral leg swelling or calf tenderness suggestive of DVT.  He does not have tachycardia, presyncope, hemoptysis and have very low suspicion for pulmonary embolus as cause of his chest pain.  No pleuritic components.  I reviewed the PA and lateral chest radiographs which show no consolidation, pneumothorax or widened mediastinum suggestive of aortic dissection or aneurysm.  He does have an area of prominent soft tissue swelling concerning for engorgement of the azygous vein.  As his pain is improved currently feel that the patient can have outpatient chest radiograph that it is not called for emergently at this time.  Patient given GI cocktail and fentanyl while getting worked up here in the emergency department with some improvement in his pain.  He is advised to follow very closely with his primary care physician.  I did contact his PCP via our messaging system to ask for close follow-up and CT chest in outpatient setting.  Final Clinical Impressions(s) / ED Diagnoses   Final diagnoses:  Atypical chest pain    ED Discharge Orders    None       Margarita Mail, PA-C 07/04/18 [place], Ankit, MD 07/07/18 1046

## 2018-07-04 NOTE — ED Triage Notes (Signed)
Per [facility] pt coming from home, c/o intermittent chest pain onset of Sunday. Pt states pain starts in left sided and radiates up and over into mid chest. Shortness of breath and nausea associated with pain. Pt reports hx of approx 12 stents. Pt adds left leg tingling/numbness onset of Sunday. Took 325 aspirin and 2 nitro at home then given 1 more nitro with EMS. Pain went from 10/10 to 8/10.

## 2018-07-04 NOTE — Discharge Instructions (Signed)

## 2018-07-06 ENCOUNTER — Telehealth: Payer: Self-pay | Admitting: *Deleted

## 2018-07-06 NOTE — Telephone Encounter (Signed)
Patient states Medicaid will not pay for Lyrica.  $600.00 out of pocket. Patient asking for something more affordable. Prior authorization cannot be attempted as there is no clinical indication of failure for cymbalta.  Clinic note states that Tramadol, Gabapentin, Fexeril, Robaxin, and Lyrica (?) are ineffective. Note indicates 'Will consider Cymbalta'.

## 2018-07-09 NOTE — Telephone Encounter (Signed)
We can prescribe him 30 mg Cymbalta daily with food. Thanks.

## 2018-07-10 MED ORDER — DULOXETINE HCL 30 MG PO CPEP: 30.0000 mg | ORAL_CAPSULE | Freq: Every day | ORAL | 0 refills | Status: DC

## 2018-07-10 MED FILL — BACLOFEN 10 MG TABLET: 10 | 30 days supply | Qty: 90 | Fill #0

## 2018-07-10 MED FILL — FLUoxetine HCL 40 MG CAPS: 40 | 30 days supply | Qty: 30 | Fill #1

## 2018-07-10 MED FILL — CLOPIDOGREL 75 MG TABLET: 75 | 30 days supply | Qty: 30 | Fill #1

## 2018-07-10 MED FILL — VALSARTAN-HCTZ 320-25 MG TA: 320-25 | 30 days supply | Qty: 30 | Fill #1

## 2018-07-10 MED FILL — PANTOPRAZOLE SOD DR 40 MG T: 40 | 30 days supply | Qty: 60 | Fill #1

## 2018-07-10 NOTE — Telephone Encounter (Signed)
Ordered, patient notified.  Encouraged to try with an open mind. Instructed to call back and report whether medication is effective or inefective

## 2018-07-10 NOTE — Addendum Note (Signed)
Addended by: Geryl Rankins D on: 07/10/2018 12:24 PM   Modules accepted: Orders

## 2018-07-11 ENCOUNTER — Telehealth: Payer: Self-pay | Admitting: Physical Medicine & Rehabilitation

## 2018-07-11 ENCOUNTER — Telehealth: Payer: Self-pay | Admitting: Internal Medicine

## 2018-07-11 NOTE — Telephone Encounter (Signed)
New Message    Pt c/o medication issue:  1. Name of Medication:DULoxetine (CYMBALTA) 30 MG capsule   2. How are you currently taking this medication (dosage and times per day)? Take 1 capsule (30 mg total) by mouth daily  3. Are you having a reaction (difficulty breathing--STAT)? Heart race, nervous, been up all night, made chest hurt more  4. What is your medication issue? Patient states that Dr. Debara Pickett referred him to a ain management provider and he was giving this medication. He states he is not feeling good on the med and he does not want to go back to this provider. Please call.

## 2018-07-11 NOTE — Telephone Encounter (Signed)
We can now order Lyrica for him.  Thanks.

## 2018-07-11 NOTE — Telephone Encounter (Signed)
Patient aware of MD advice and he states he has called Dr. Serita Grit office with his update on Cymbalta as well. Reiterated advice provided by Lattie Haw RN this AM. Patient verbalized understanding. He will await call from pain specialist's office.

## 2018-07-11 NOTE — Telephone Encounter (Signed)
We don't have anything more to offer at this point. I would advise he continue to work with the pain specialist - there are other alternatives to Cymbalta.  Dr. Lemmie Evens

## 2018-07-11 NOTE — Telephone Encounter (Signed)
Pt started new medication(cymbalta) yday(mon 8/19). He feels shaky, heart racing, nausea & unable to sleep.  Please call to advise... Pt has spoke with heart md & has been advised not to continue this rx.  Meribeth Mattes

## 2018-07-11 NOTE — Telephone Encounter (Signed)
Pt sts that he suffers from chronic chest pain.  Pt sts that he was seen by the pain management physician that he was referred to by Dr.Hilty.  Pt sts that he was prescribed Cymbalta after Lyrica was denied by his insurance.  Pt took Cymbalta for the first time yesterday afternoon, pt sts that it made him feel worse. His chest pain was worse and he began to feel anxious and nervous.  Today he still has some chest discomfort and a feeling of anxiousness.  He sts he will not go back to pain management and will not take anymore of the Cymbalta. He does not why they would prescribe something that would hurt him. Adv pt that medication management is sometimes trial and error. Some medications do not work for everyone. I recommended he f/u with pain management and update them on his intolerance of Cymbalta. He refused.  Pt sts that he would like a message fwd to Dr.Hilty and his Nurse Eliezer Lofts to call him. Adv pt that I would fwd the message

## 2018-07-12 NOTE — Addendum Note (Signed)
Addended by: Caro Hight on: 07/12/2018 09:02 AM   Modules accepted: Orders

## 2018-07-12 NOTE — Telephone Encounter (Signed)
Prior authorization submitted to [employer] for lyrica 150mg  tid

## 2018-07-13 ENCOUNTER — Telehealth: Payer: Self-pay | Admitting: *Deleted

## 2018-07-13 ENCOUNTER — Other Ambulatory Visit: Payer: Self-pay | Admitting: Family Medicine

## 2018-07-13 ENCOUNTER — Other Ambulatory Visit: Payer: Self-pay | Admitting: *Deleted

## 2018-07-13 ENCOUNTER — Telehealth: Payer: Self-pay

## 2018-07-13 DIAGNOSIS — R9389 Abnormal findings on diagnostic imaging of other specified body structures: Secondary | ICD-10-CM

## 2018-07-13 NOTE — Progress Notes (Unsigned)
Please inform him that I ordered a follow-up chest x-ray due to abnormal chest x-ray done during his last ED visit.

## 2018-07-13 NOTE — Progress Notes (Signed)
Patient was called and voicemail is currently full and not allowed any new messages.

## 2018-07-13 NOTE — Telephone Encounter (Signed)
Error

## 2018-07-13 NOTE — Telephone Encounter (Signed)
lyrica prior authorization approved

## 2018-07-20 ENCOUNTER — Telehealth: Payer: Self-pay | Admitting: Internal Medicine

## 2018-07-20 NOTE — Telephone Encounter (Signed)
New message:       Pt is calling in reference to some samples he is currently out of but pt can not remember the name of the samples. Pt states Eliezer Lofts will know which medication he is speaking of.

## 2018-07-20 NOTE — Telephone Encounter (Signed)
Patient needs samples of Bystolic

## 2018-07-21 NOTE — Telephone Encounter (Signed)
Medication samples have been provided to the patient.  Drug name: bystolic 20mg   Qty: 4 boxes  LOT: [id number]  Exp.Date: 03/2020  Samples left at front desk for patient pick-up. Patient notified 8/29  Sheral Apley M 8:00 AM 07/21/2018

## 2018-07-25 ENCOUNTER — Telehealth: Payer: Self-pay | Admitting: Family Medicine

## 2018-07-25 ENCOUNTER — Encounter: Payer: Self-pay | Admitting: Family Medicine

## 2018-07-25 ENCOUNTER — Other Ambulatory Visit: Payer: Self-pay | Admitting: Family Medicine

## 2018-07-25 ENCOUNTER — Telehealth: Payer: Self-pay | Admitting: *Deleted

## 2018-07-25 ENCOUNTER — Ambulatory Visit (HOSPITAL_COMMUNITY)
Admission: RE | Admit: 2018-07-25 | Discharge: 2018-07-25 | Disposition: A | Discharge status: Home / Self Care | Payer: Medicare Other | Source: Ambulatory Visit | Attending: Family Medicine | Admitting: Family Medicine

## 2018-07-25 ENCOUNTER — Ambulatory Visit (HOSPITAL_BASED_OUTPATIENT_CLINIC_OR_DEPARTMENT_OTHER): Payer: Medicare Other | Admitting: Family Medicine

## 2018-07-25 VITALS — BP 124/77 | HR 71 | Temp 98.4°F | Ht 68.0 in | Wt 243.0 lb

## 2018-07-25 DIAGNOSIS — Z7982 Long term (current) use of aspirin: Secondary | ICD-10-CM | POA: Diagnosis not present

## 2018-07-25 DIAGNOSIS — M5116 Intervertebral disc disorders with radiculopathy, lumbar region: Secondary | ICD-10-CM

## 2018-07-25 DIAGNOSIS — Z955 Presence of coronary angioplasty implant and graft: Secondary | ICD-10-CM | POA: Insufficient documentation

## 2018-07-25 DIAGNOSIS — F419 Anxiety disorder, unspecified: Secondary | ICD-10-CM | POA: Diagnosis not present

## 2018-07-25 DIAGNOSIS — G8929 Other chronic pain: Secondary | ICD-10-CM | POA: Insufficient documentation

## 2018-07-25 DIAGNOSIS — F515 Nightmare disorder: Secondary | ICD-10-CM | POA: Diagnosis not present

## 2018-07-25 DIAGNOSIS — I25110 Atherosclerotic heart disease of native coronary artery with unstable angina pectoris: Secondary | ICD-10-CM | POA: Insufficient documentation

## 2018-07-25 DIAGNOSIS — J449 Chronic obstructive pulmonary disease, unspecified: Secondary | ICD-10-CM | POA: Insufficient documentation

## 2018-07-25 DIAGNOSIS — G4709 Other insomnia: Secondary | ICD-10-CM

## 2018-07-25 DIAGNOSIS — K219 Gastro-esophageal reflux disease without esophagitis: Secondary | ICD-10-CM | POA: Insufficient documentation

## 2018-07-25 DIAGNOSIS — Z9861 Coronary angioplasty status: Secondary | ICD-10-CM

## 2018-07-25 DIAGNOSIS — I2510 Atherosclerotic heart disease of native coronary artery without angina pectoris: Secondary | ICD-10-CM

## 2018-07-25 DIAGNOSIS — I10 Essential (primary) hypertension: Secondary | ICD-10-CM | POA: Insufficient documentation

## 2018-07-25 DIAGNOSIS — E0800 Diabetes mellitus due to underlying condition with hyperosmolarity without nonketotic hyperglycemic-hyperosmolar coma (NKHHC): Secondary | ICD-10-CM

## 2018-07-25 DIAGNOSIS — R222 Localized swelling, mass and lump, trunk: Secondary | ICD-10-CM

## 2018-07-25 DIAGNOSIS — F32A Depression, unspecified: Secondary | ICD-10-CM

## 2018-07-25 DIAGNOSIS — G4730 Sleep apnea, unspecified: Secondary | ICD-10-CM | POA: Insufficient documentation

## 2018-07-25 DIAGNOSIS — Z888 Allergy status to other drugs, medicaments and biological substances status: Secondary | ICD-10-CM | POA: Insufficient documentation

## 2018-07-25 DIAGNOSIS — E785 Hyperlipidemia, unspecified: Secondary | ICD-10-CM | POA: Diagnosis not present

## 2018-07-25 DIAGNOSIS — R0602 Shortness of breath: Secondary | ICD-10-CM

## 2018-07-25 DIAGNOSIS — Z7951 Long term (current) use of inhaled steroids: Secondary | ICD-10-CM | POA: Insufficient documentation

## 2018-07-25 DIAGNOSIS — Z79899 Other long term (current) drug therapy: Secondary | ICD-10-CM | POA: Insufficient documentation

## 2018-07-25 DIAGNOSIS — F329 Major depressive disorder, single episode, unspecified: Secondary | ICD-10-CM

## 2018-07-25 DIAGNOSIS — R9389 Abnormal findings on diagnostic imaging of other specified body structures: Secondary | ICD-10-CM

## 2018-07-25 DIAGNOSIS — E119 Type 2 diabetes mellitus without complications: Secondary | ICD-10-CM | POA: Diagnosis not present

## 2018-07-25 DIAGNOSIS — E870 Hyperosmolality and hypernatremia: Secondary | ICD-10-CM | POA: Insufficient documentation

## 2018-07-25 DIAGNOSIS — Z7984 Long term (current) use of oral hypoglycemic drugs: Secondary | ICD-10-CM | POA: Diagnosis not present

## 2018-07-25 DIAGNOSIS — G4700 Insomnia, unspecified: Secondary | ICD-10-CM | POA: Insufficient documentation

## 2018-07-25 LAB — GLUCOSE, POCT (MANUAL RESULT ENTRY): POC GLUCOSE: 104 mg/dL — AB (ref 70–99)

## 2018-07-25 LAB — POCT GLYCOSYLATED HEMOGLOBIN (HGB A1C): HbA1c, POC (controlled diabetic range): 5.7 % (ref 0.0–7.0)

## 2018-07-25 MED ORDER — TRAZODONE HCL 50 MG PO TABS: 50.0000 mg | ORAL_TABLET | Freq: Every evening | ORAL | 5 refills | Status: DC | PRN

## 2018-07-25 MED ORDER — TAMSULOSIN HCL 0.4 MG PO CAPS: 0.4000 mg | ORAL_CAPSULE | Freq: Every day | ORAL | 5 refills | Status: DC

## 2018-07-25 MED ORDER — PRAZOSIN HCL 1 MG PO CAPS: 1.0000 mg | ORAL_CAPSULE | Freq: Every day | ORAL | 2 refills | Status: DC

## 2018-07-25 MED ORDER — ALBUTEROL SULFATE 108 (90 BASE) MCG/ACT IN AEPB: 1.0000 | INHALATION_SPRAY | Freq: Four times a day (QID) | RESPIRATORY_TRACT | 1 refills | Status: DC | PRN

## 2018-07-25 MED ORDER — FLUTICASONE-SALMETEROL 100-50 MCG/DOSE IN AEPB: 1.0000 | INHALATION_SPRAY | Freq: Two times a day (BID) | RESPIRATORY_TRACT | 3 refills | Status: DC

## 2018-07-25 MED FILL — TAMSULOSIN HCL 0.4 MG CAP: 0.4 | 30 days supply | Qty: 30 | Fill #0

## 2018-07-25 MED FILL — traZODone HCL 50 MG TABS: 50 | 30 days supply | Qty: 30 | Fill #0

## 2018-07-25 MED FILL — ADVAIR 100/50 DISKUS: 100-50 | 30 days supply | Qty: 60 | Fill #0

## 2018-07-25 MED FILL — PRAZOSIN 1 MG CAPSULE: 1 | 30 days supply | Qty: 30 | Fill #0

## 2018-07-25 NOTE — Telephone Encounter (Addendum)
Jane with Radiology called to inform of cx-results. Please advise  IMPRESSION: Persistent prominent soft tissue process noted over the right paratracheal region. No interim improvement from prior exam. This could represent a mass lesion or aneurysm. Contrast-enhanced chest CT is suggested for further evaluation. Coronary artery disease. These results will be called to the ordering clinician or representative by the Radiologist Assistant, and communication documented in the PACS or zVision Dashboard.   Dr. Margarita Rana informed of results. Orders to have patient proceed for CT Scan- STAT.

## 2018-07-25 NOTE — Progress Notes (Signed)
Subjective:  Patient ID: Andrew West, male    DOB: Apr 03, 1966  Age: 52 y.o. MRN: [id number]  CC: Diabetes   HPI Andrew West  is a 52 y.o. with a  Medical history of Type 2 DM (A1c 5.8), CAD s/p PCI/stent to LCx and OM1 In 09/2015; cutting balloon angioplasty to OM 1, HLD, HTN, previous tobacco abuse, chronic back pain s/p laminectomy/discectomy (3 years ago by Dr Dutch Quint), depression who comes into the clinic for a follow-up visit. He complains of depression which is uncontrolled on his current regimen and this has worsened ever since he lost his brother 5 months ago.  He endorses low energy, difficulty sleeping despite using trazodone and endorses having nightmares and crazy dreams which have involved hurting people (did not want this included in his chart) but has no plans to actually carry this out and has no suicidal ideations or intents. His A1c is 5.8 and he is doing well on metformin; denies hypoglycemia or visual concerns. Continues to have chronic low back pain which radiates down to his legs and also has chest pains.  Seen by Dr. Allena Katz of physical medicine and rehab and Lyrica dose was increased with no relief in symptoms.  He was commenced on Cymbalta which made him sick. He complains of dyspnea which hits him suddenly even while at rest and has been compliant with Asmanex and his Proventil inhaler.  He denies being overly short of breath on exertion and does not smoke currently. PFTs from 2016 revealed severe obstructive airway disease-asthmatic type. Echo from 02/2018 revealed:  Study Conclusions  - Left ventricle: The cavity size was normal. Wall thickness was   normal. Systolic function was normal. The estimated ejection   fraction was in the range of 55% to 60%. Possible hypokinesis of   the inferolateral myocardium. Doppler parameters are consistent   with abnormal left ventricular relaxation (grade 1 diastolic   dysfunction). - Mitral valve: Calcified annulus. -  Left atrium: The atrium was mildly dilated.  Impressions:  - Possible mild hypokinesis of the inferolateral wall with overall   normal LV systolic function; mild diastolic dysfunction; mild   LAE.  Past Medical History:  Diagnosis Date  . Anxiety   . CAD (coronary artery disease)    a. s/p multiple PCIs with last cath 11/2016 with severe multivessel CAD, s/p PCTA to LCx but unable to pass stent  . Chronic leg pain    bilateral  . Chronic lower back pain   . COPD (chronic obstructive pulmonary disease) (HCC)   . Depression   . GERD (gastroesophageal reflux disease)    Takes Dexilant  . HLD (hyperlipidemia)   . Hypertension   . Rhabdomyolysis    h/o, r/t statins  . Sleep apnea    "can't tolerate mask" (12/16/2016)  . Type II diabetes mellitus (HCC)     Past Surgical History:  Procedure Laterality Date  . BACK SURGERY    . CARDIAC CATHETERIZATION N/A 09/25/2015   Procedure: Left Heart Cath and Coronary Angiography;  Surgeon: Marykay Lex, MD;  Location: [facility] INVASIVE CV LAB;  Service: Cardiovascular;  Laterality: N/A;  . CARDIAC CATHETERIZATION N/A 12/16/2016   Procedure: Left Heart Cath and Coronary Angiography;  Surgeon: Marykay Lex, MD;  Location: [facility] INVASIVE CV LAB;  Service: Cardiovascular;  Laterality: N/A;  . CARDIAC CATHETERIZATION N/A 12/16/2016   Procedure: Coronary Balloon Angioplasty;  Surgeon: Marykay Lex, MD;  Location: [facility] INVASIVE CV LAB;  Service: Cardiovascular;  Laterality:  N/A;  . COLONOSCOPY W/ POLYPECTOMY    . CORONARY ANGIOPLASTY  09/25/2015   mid cir & om  . CORONARY ANGIOPLASTY WITH STENT PLACEMENT  10/09/2001   PTCA & stenting of mid AV circumflex; 2.5x52mm Pixel stent  . CORONARY ANGIOPLASTY WITH STENT PLACEMENT  12/13/2001   PCI with stent to mid L circumflex, 95% stenosis to 0% residual  . CORONARY ANGIOPLASTY WITH STENT PLACEMENT  10/10/2003   PCI to mid AV circumflex; LAD 30% disease; RCA 100% occluded prox.  . CORONARY ANGIOPLASTY WITH  STENT PLACEMENT  09/01/2011   PCI with stenting with bare metal stent to mid AV groove circumflex and PDA  . CORONARY ANGIOPLASTY WITH STENT PLACEMENT  10/17/2011   cutting balloon angioplasty of ostial lateral OM1 branch and bifurcation AV groove circumflex OM junction; stenosis reduced to 0%  . EXCISIONAL HEMORRHOIDECTOMY    . LEFT HEART CATHETERIZATION WITH CORONARY ANGIOGRAM N/A 10/18/2011   Procedure: LEFT HEART CATHETERIZATION WITH CORONARY ANGIOGRAM;  Surgeon: Marykay Lex, MD;  Location: [facility] CATH LAB;  Service: Cardiovascular;  Laterality: N/A;  . LUMBAR LAMINECTOMY/DECOMPRESSION MICRODISCECTOMY  03/31/2012   Procedure: LUMBAR LAMINECTOMY/DECOMPRESSION MICRODISCECTOMY 1 LEVEL;  Surgeon: Temple Pacini, MD;  Location: [facility] NEURO ORS;  Service: Neurosurgery;  Laterality: Left;  . TRANSTHORACIC ECHOCARDIOGRAM  07/28/2011   EF 55-65%; LVH, grade 1 diastolic dysfunction;     Allergies  Allergen Reactions  . Iohexol Anaphylaxis    PT. TO BE PREMEDICATED PRIOR TO IV CONTRAST PER DR Eppie Gibson /MMS//12/15/15Desc: PT BECAME SOB AND CHEST TIGHTNESS AFTER CONTRAST INJECTION.  [name],RT-RCT., Onset Date: [id number]      Outpatient Medications Prior to Visit  Medication Sig Dispense Refill  . amLODipine (NORVASC) 10 MG tablet Take 1 tablet (10 mg total) by mouth daily. 90 tablet 1  . aspirin 81 MG tablet Take 1 tablet (81 mg total) daily by mouth. 30 tablet   . atorvastatin (LIPITOR) 40 MG tablet Take 1 tablet (40 mg total) by mouth daily. 90 tablet 1  . baclofen (LIORESAL) 10 MG tablet Take 1 tablet (10 mg total) by mouth 3 (three) times daily. 90 each 1  . Blood Glucose Monitoring Suppl (ACCU-CHEK AVIVA PLUS) w/Device KIT Use as dircted 1 kit 0  . cetirizine (ZYRTEC) 10 MG tablet Take 1 tablet (10 mg total) daily by mouth. 30 tablet 1  . clopidogrel (PLAVIX) 75 MG tablet Take 1 tablet (75 mg total) by mouth daily. 90 tablet 1  . diclofenac sodium (VOLTAREN) 1 % GEL Apply 2 g topically 4  (four) times daily. 1 Tube 0  . FLUoxetine (PROZAC) 40 MG capsule Take 1 capsule (40 mg total) by mouth daily. 90 capsule 1  . fluticasone (FLONASE) 50 MCG/ACT nasal spray Place 2 sprays into both nostrils daily. (Patient taking differently: Place 2 sprays into both nostrils daily as needed for allergies. ) 16 g 6  . furosemide (LASIX) 20 MG tablet Take 20 mg by mouth as needed.     Marland Kitchen glucose blood (ACCU-CHEK AVIVA) test strip Use as instructed 100 each 12  . hydrALAZINE (APRESOLINE) 25 MG tablet Take 1 tablet (25 mg total) by mouth 2 (two) times daily. 180 tablet 1  . hydrocortisone-pramoxine (ANALPRAM-HC) 2.5-1 % rectal cream PLACE 1 APPLICATION RECTALLY 3 (THREE) TIMES DAILY. (Patient taking differently: Place 1 application rectally as needed. ) 30 g 1  . isosorbide mononitrate (IMDUR) 120 MG 24 hr tablet Take 1 tablet (120 mg total) by mouth daily. (Patient taking differently: Take 60  mg by mouth 2 (two) times daily. ) 90 tablet 1  . losartan-hydrochlorothiazide (HYZAAR) 100-25 MG tablet Take 1 tablet by mouth daily. 90 tablet 1  . metFORMIN (GLUCOPHAGE) 1000 MG tablet Take 1 tablet (1,000 mg total) by mouth 2 (two) times daily with a meal. 180 tablet 1  . Nebivolol HCl 20 MG TABS Take 1 tablet (20 mg total) by mouth daily. 90 tablet 3  . nitroGLYCERIN (NITROSTAT) 0.4 MG SL tablet Place 1 tablet (0.4 mg total) under the tongue every 5 (five) minutes as needed for chest pain. 25 tablet 2  . pantoprazole (PROTONIX) 40 MG tablet Take 1 tablet (40 mg total) by mouth 2 (two) times daily. 180 tablet 1  . pregabalin (LYRICA) 150 MG capsule Take 1 capsule (150 mg total) by mouth 3 (three) times daily. 90 capsule 1  . ranolazine (RANEXA) 1000 MG SR tablet Take 1 tablet (1,000 mg total) by mouth 2 (two) times daily. (Patient taking differently: Take 1,000 mg by mouth daily. ) 180 tablet 3  . tiotropium (SPIRIVA HANDIHALER) 18 MCG inhalation capsule Place 1 capsule (18 mcg total) into inhaler and inhale  daily. 90 capsule 1  . valsartan-hydrochlorothiazide (DIOVAN-HCT) 320-25 MG tablet Take 1 tablet by mouth daily.    . vitamin B-12 (CYANOCOBALAMIN) 500 MCG tablet Take 1 tablet (500 mcg total) by mouth daily. 30 tablet 1  . Albuterol Sulfate (PROAIR RESPICLICK) 108 (90 Base) MCG/ACT AEPB Inhale 1 puff into the lungs every 6 (six) hours as needed (shortness of breath). 3 each 1  . mometasone (ASMANEX) 220 MCG/INH inhaler Inhale 2 puffs into the lungs daily. 3 Inhaler 1  . tamsulosin (FLOMAX) 0.4 MG CAPS capsule Take 1 capsule (0.4 mg total) daily by mouth. 30 capsule 5  . traZODone (DESYREL) 50 MG tablet Take 1 tablet (50 mg total) at bedtime as needed by mouth. for sleep 30 tablet 5  . benzonatate (TESSALON) 100 MG capsule Take 1 capsule (100 mg total) by mouth every 8 (eight) hours. (Patient not taking: Reported on 07/04/2018) 21 capsule 0  . traMADol (ULTRAM) 50 MG tablet Take 1 tablet (50 mg total) by mouth every 6 (six) hours as needed for severe pain. (Patient not taking: Reported on 07/04/2018) 30 tablet 0   No facility-administered medications prior to visit.     ROS Review of Systems  Constitutional: Negative for activity change and appetite change.  HENT: Negative for sinus pressure and sore throat.   Eyes: Negative for visual disturbance.  Respiratory: Positive for shortness of breath. Negative for cough and chest tightness.   Cardiovascular: Negative for chest pain and leg swelling.  Gastrointestinal: Negative for abdominal distention, abdominal pain, constipation and diarrhea.  Endocrine: Negative.   Genitourinary: Negative for dysuria.  Musculoskeletal: Positive for back pain. Negative for joint swelling and myalgias.  Skin: Negative for rash.  Allergic/Immunologic: Negative.   Neurological: Negative for weakness, light-headedness and numbness.  Psychiatric/Behavioral: Positive for dysphoric mood and sleep disturbance. Negative for suicidal ideas.    Objective:  BP 124/77    Pulse 71   Temp 98.4 F (36.9 C) (Oral)   Ht 5\' 8"  (1.727 m)   Wt 243 lb (110.2 kg)   SpO2 96%   BMI 36.95 kg/m   BP/Weight 07/25/2018 07/04/2018 06/30/2018  Systolic BP 124 143 128  Diastolic BP 77 96 82  Wt. (Lbs) 243 239 239  BMI 36.95 36.34 36.34      Physical Exam  Constitutional: He is oriented to person, place,  and time. He appears well-developed and well-nourished.  Cardiovascular: Normal rate, normal heart sounds and intact distal pulses.  No murmur heard. Pulmonary/Chest: Effort normal and breath sounds normal. He has no wheezes. He has no rales. He exhibits no tenderness.  Abdominal: Soft. Bowel sounds are normal. He exhibits no distension and no mass. There is no tenderness.  Musculoskeletal: Normal range of motion.  Tenderness to palpation of lumbar spine  Neurological: He is alert and oriented to person, place, and time.  Skin: Skin is warm and dry.  Psychiatric:  Dysphoric mood    CMP Latest Ref Rng & Units 07/04/2018 02/25/2018 02/24/2018  Glucose 70 - 99 mg/dL 595(G) - -  BUN 6 - 20 mg/dL 6 - -  Creatinine 3.87 - 1.24 mg/dL 5.64 3.32 -  Sodium 951 - 145 mmol/L 139 - -  Potassium 3.5 - 5.1 mmol/L 3.2(L) - 3.7  Chloride 98 - 111 mmol/L 104 - -  CO2 22 - 32 mmol/L 25 - -  Calcium 8.9 - 10.3 mg/dL 8.8(C) - -  Total Protein 6.5 - 8.1 g/dL - - -  Total Bilirubin 0.3 - 1.2 mg/dL - - -  Alkaline Phos 38 - 126 U/L - - -  AST 15 - 41 U/L - - -  ALT 17 - 63 U/L - - -    Lipid Panel     Component Value Date/Time   CHOL 106 03/15/2017 0940   TRIG 84 03/15/2017 0940   HDL 29 (L) 03/15/2017 0940   CHOLHDL 3.7 03/15/2017 0940   CHOLHDL 5.2 (H) 09/21/2016 0900   VLDL 31 (H) 09/21/2016 0900   LDLCALC 60 03/15/2017 0940    Lab Results  Component Value Date   HGBA1C 5.7 07/25/2018    Assessment & Plan:   1. Diabetes mellitus due to underlying condition with hyperosmolarity without coma, without long-term current use of insulin (HCC) Controlled with A1c of  5.7 Counseled on Diabetic diet, my plate method, 166 minutes of moderate intensity exercise/week Keep blood sugar logs with fasting goals of 80-120 mg/dl, random of less than 063 and in the event of sugars less than 60 mg/dl or greater than 016 mg/dl please notify the clinic ASAP. It is recommended that you undergo annual eye exams and annual foot exams. Pneumonia vaccine is recommended. - POCT glucose (manual entry) - POCT glycosylated hemoglobin (Hb A1C) - CMP14+EGFR - Lipid panel  2. COPD with asthma (HCC) Controlled Discontinue Asmanex and commence Advair Consider PFTs if symptoms persist - Albuterol Sulfate (PROAIR RESPICLICK) 108 (90 Base) MCG/ACT AEPB; Inhale 1 puff into the lungs every 6 (six) hours as needed (shortness of breath).  Dispense: 3 each; Refill: 1 - Fluticasone-Salmeterol (ADVAIR) 100-50 MCG/DOSE AEPB; Inhale 1 puff into the lungs 2 (two) times daily.  Dispense: 1 each; Refill: 3  3. Other insomnia Currently on trazodone which is not helping - traZODone (DESYREL) 50 MG tablet; Take 1 tablet (50 mg total) by mouth at bedtime as needed. for sleep  Dispense: 30 tablet; Refill: 5 - tamsulosin (FLOMAX) 0.4 MG CAPS capsule; Take 1 capsule (0.4 mg total) by mouth daily.  Dispense: 30 capsule; Refill: 5  4. Anxiety and depression Uncontrolled; triggered by bereavement. Currently on Prozac and trazodone He declines intervention of LCSW in the clinic today Will benefit for pharmacotherapy as well as psychotherapy - Ambulatory referral to Psychiatry  5. CAD S/P multiple PCI's Continues to have ongoing unstable angina Followed by cardiology Risk factor modifications  6.  Nightmares Placed  on prazosin  7.  Lumbar disc herniation with radiculopathy Controlled Currently managed by physical therapy  Meds ordered this encounter  Medications  . Albuterol Sulfate (PROAIR RESPICLICK) 108 (90 Base) MCG/ACT AEPB    Sig: Inhale 1 puff into the lungs every 6 (six) hours as  needed (shortness of breath).    Dispense:  3 each    Refill:  1  . Fluticasone-Salmeterol (ADVAIR) 100-50 MCG/DOSE AEPB    Sig: Inhale 1 puff into the lungs 2 (two) times daily.    Dispense:  1 each    Refill:  3    Discontinue Asmanex  . traZODone (DESYREL) 50 MG tablet    Sig: Take 1 tablet (50 mg total) by mouth at bedtime as needed. for sleep    Dispense:  30 tablet    Refill:  5  . tamsulosin (FLOMAX) 0.4 MG CAPS capsule    Sig: Take 1 capsule (0.4 mg total) by mouth daily.    Dispense:  30 capsule    Refill:  5    Follow-up: Return in about 6 weeks (around 09/05/2018) for Follow-up of anxiety and depression.   [name] MD

## 2018-07-25 NOTE — Telephone Encounter (Signed)
He may increase to 75mg .  Thanks.

## 2018-07-25 NOTE — Progress Notes (Unsigned)
Could you please schedule this patient for a CT chest with contrast?  Thank you

## 2018-07-25 NOTE — Telephone Encounter (Signed)
Jane from the radiologist office called with results for patient and is wanting to give results ASAP.

## 2018-07-25 NOTE — Telephone Encounter (Signed)
Patient left a message stating that Lyrica 50 mg is not working. Asking for another option

## 2018-07-25 NOTE — Patient Instructions (Signed)
Persistent Depressive Disorder Persistent depressive disorder (PDD) is a mental health condition. PDD causes symptoms of low-level depression for 2 years or longer. It may also be called long-term (chronic) depression or dysthymia. PDD may include episodes of more severe depression that last for about 2 weeks (major depressive disorder or MDD). PDD can affect the way you think, feel, and sleep. This condition may also affect your relationships. You may be more likely to get sick if you have PDD. Symptoms of PDD occur for most of the day and may include:  Feeling tired (fatigue).  Low energy.  Eating too much or too little.  Sleeping too much or too little.  Feeling restless or agitated.  Feeling hopeless.  Feeling worthless or guilty.  Feeling worried or nervous (anxiety).  Trouble concentrating or making decisions.  Low self-esteem.  A negative way of looking at things (outlook).  Not being able to have fun or feel pleasure.  Avoiding interacting with people.  Getting angry or annoyed easily (irritability).  Acting aggressive or angry.  Follow these instructions at home: Activity  Go back to your normal activities as told by your doctor.  Exercise regularly as told by your doctor. General instructions  Take over-the-counter and prescription medicines only as told by your doctor.  Do not drink alcohol. Or, limit how much alcohol you drink to no more than 1 drink a day for nonpregnant women and 2 drinks a day for men. One drink equals 12 oz of beer, 5 oz of wine, or 1 oz of hard liquor. Alcohol can affect any antidepressant medicines you are taking. Talk with your doctor about your alcohol use.  Eat a healthy diet and get plenty of sleep.  Find activities that you enjoy each day.  Consider joining a support group. Your doctor may be able to suggest a support group.  Keep all follow-up visits as told by your doctor. This is important. Where to find more  information: National Alliance on Mental Illness  www.nami.org  U.S. National Institute of Mental Health  www.nimh.nih.gov  National Suicide Prevention Lifeline  1-800-273-TALK (1-800-273-8255). This is free, 24-hour help.  Contact a doctor if:  Your symptoms get worse.  You have new symptoms.  You have trouble sleeping or doing your daily activities. Get help right away if:  You self-harm.  You have serious thoughts about hurting yourself or others.  You see, hear, taste, smell, or feel things that are not there (hallucinate). This information is not intended to replace advice given to you by your health care provider. Make sure you discuss any questions you have with your health care provider. Document Released: 10/20/2015 Document Revised: 07/02/2016 Document Reviewed: 07/02/2016 Elsevier Interactive Patient Education  2017 Elsevier Inc.  

## 2018-07-25 NOTE — Telephone Encounter (Signed)
Lyrica 75 mg will require another Prior Authorization

## 2018-07-26 ENCOUNTER — Other Ambulatory Visit: Payer: Self-pay | Admitting: Family Medicine

## 2018-07-26 ENCOUNTER — Telehealth: Payer: Self-pay | Admitting: Physical Medicine & Rehabilitation

## 2018-07-26 ENCOUNTER — Other Ambulatory Visit: Payer: Self-pay | Admitting: *Deleted

## 2018-07-26 DIAGNOSIS — R222 Localized swelling, mass and lump, trunk: Secondary | ICD-10-CM

## 2018-07-26 LAB — CMP14+EGFR
ALBUMIN: 4.3 g/dL (ref 3.5–5.5)
ALK PHOS: 66 IU/L (ref 39–117)
ALT: 17 IU/L (ref 0–44)
AST: 16 IU/L (ref 0–40)
Albumin/Globulin Ratio: 1.7 (ref 1.2–2.2)
BILIRUBIN TOTAL: 0.2 mg/dL (ref 0.0–1.2)
BUN / CREAT RATIO: 11 (ref 9–20)
BUN: 10 mg/dL (ref 6–24)
CO2: 23 mmol/L (ref 20–29)
CREATININE: 0.92 mg/dL (ref 0.76–1.27)
Calcium: 9 mg/dL (ref 8.7–10.2)
Chloride: 106 mmol/L (ref 96–106)
GFR calc Af Amer: 111 mL/min/{1.73_m2} (ref >59)
GFR calc non Af Amer: 96 mL/min/{1.73_m2} (ref >59)
GLOBULIN, TOTAL: 2.6 g/dL (ref 1.5–4.5)
GLUCOSE: 89 mg/dL (ref 65–99)
Potassium: 4.1 mmol/L (ref 3.5–5.2)
Sodium: 144 mmol/L (ref 134–144)
Total Protein: 6.9 g/dL (ref 6.0–8.5)

## 2018-07-26 LAB — LIPID PANEL
Chol/HDL Ratio: 7.5 ratio — ABNORMAL HIGH (ref 0.0–5.0)
Cholesterol, Total: 180 mg/dL (ref 100–199)
HDL: 24 mg/dL — ABNORMAL LOW (ref >39)
LDL CALC: 126 mg/dL — AB (ref 0–99)
Triglycerides: 149 mg/dL (ref 0–149)
VLDL CHOLESTEROL CAL: 30 mg/dL (ref 5–40)

## 2018-07-26 LAB — VITAMIN D 25 HYDROXY (VIT D DEFICIENCY, FRACTURES): Vit D, 25-Hydroxy: 22.7 ng/mL — ABNORMAL LOW (ref 30.0–100.0)

## 2018-07-26 LAB — MAGNESIUM: Magnesium: 1.8 mg/dL (ref 1.6–2.3)

## 2018-07-26 MED ORDER — PREGABALIN 200 MG PO CAPS: 200.0000 mg | ORAL_CAPSULE | Freq: Three times a day (TID) | ORAL | 2 refills | Status: DC

## 2018-07-26 MED ORDER — ROSUVASTATIN CALCIUM 20 MG PO TABS: 20.0000 mg | ORAL_TABLET | Freq: Every day | ORAL | 1 refills | Status: DC

## 2018-07-26 MED FILL — ROSUVASTATIN CALCIUM 20 MG: 20 | 30 days supply | Qty: 30 | Fill #0

## 2018-07-26 NOTE — Progress Notes (Signed)
CT w/o contrast approved. Pt has an appointment scheduled on 07/31/18 at 2:30 at [facility].  Attempt to call patient to inform. No answer. Will attempt call again.

## 2018-07-26 NOTE — Telephone Encounter (Signed)
He can increase to 200 TID.  Thanks.

## 2018-07-26 NOTE — Telephone Encounter (Signed)
Pt notified of increase authorized by Dr. Posey Pronto

## 2018-07-26 NOTE — Progress Notes (Addendum)
Awaiting approval for prior auth CT w/o contrast.  CT with contrast approved, however patient has an allergy to contrast: SOB, Chest pain, "shuts my heart down".

## 2018-07-26 NOTE — Telephone Encounter (Signed)
error 

## 2018-07-26 NOTE — Telephone Encounter (Signed)
The original message was not correct. I verified with Mr Andrew West that he is taking 150 mg tid and it is not helping him at all.  Please advise.

## 2018-07-26 NOTE — Addendum Note (Signed)
Addended by: Caro Hight on: 07/26/2018 02:14 PM   Modules accepted: Orders

## 2018-07-26 NOTE — Telephone Encounter (Signed)
New dose called to the pharmacy.

## 2018-07-27 ENCOUNTER — Telehealth: Payer: Self-pay

## 2018-07-27 ENCOUNTER — Other Ambulatory Visit: Payer: Self-pay | Admitting: *Deleted

## 2018-07-27 NOTE — Telephone Encounter (Signed)
Referral faxed to [facility] at the request of Dr Margarita Rana.

## 2018-07-27 NOTE — Telephone Encounter (Signed)
Patient was called and voicemail box is full and can not receive any new messages.

## 2018-07-27 NOTE — Telephone Encounter (Signed)
I had referred him to pain management in 09/2017 and he was supposed to follow-up with them.

## 2018-07-27 NOTE — Telephone Encounter (Signed)
-----   Message from [name], MD sent at 07/26/2018  8:37 AM EDT ----- Please inform him his cholesterol is elevated.  I have switched him from Lipitor to Crestor.  Please encouraged to adhere to a low-cholesterol diet.

## 2018-07-27 NOTE — Telephone Encounter (Signed)
Pt request refill on Tylenol # 3.

## 2018-07-27 NOTE — Telephone Encounter (Signed)
Pt informed of message. He states they will not fill medication there. He states they have him taking Lyrica. Informed that he would breech the contract he signed if pain medication would be prescribed from another MD. Pt verbalized understanding.

## 2018-07-28 ENCOUNTER — Telehealth: Payer: Self-pay

## 2018-07-28 NOTE — Telephone Encounter (Signed)
Patient was called to go over lab results, patient informed me that he is having more periods of SOB.

## 2018-07-28 NOTE — Telephone Encounter (Signed)
Patient was called and informed of lab results. 

## 2018-07-28 NOTE — Telephone Encounter (Signed)
-----   Message from [name], MD sent at 07/26/2018  8:37 AM EDT ----- Please inform him his cholesterol is elevated.  I have switched him from Lipitor to Crestor.  Please encouraged to adhere to a low-cholesterol diet.

## 2018-07-30 ENCOUNTER — Telehealth: Payer: Self-pay | Admitting: Cardiology

## 2018-07-30 NOTE — Telephone Encounter (Signed)
Pt called with 2 day hx of chest pain, diarrhea feeling like he will pass out.  Instructed he would need to come to ER.  Pt agreeable.

## 2018-07-31 ENCOUNTER — Emergency Department (HOSPITAL_COMMUNITY)
Admission: EM | Admit: 2018-07-31 | Discharge: 2018-07-31 | Discharge status: Absent without leave | Payer: Medicare Other

## 2018-07-31 ENCOUNTER — Ambulatory Visit (HOSPITAL_COMMUNITY)
Admission: RE | Admit: 2018-07-31 | Discharge: 2018-07-31 | Disposition: A | Discharge status: Home / Self Care | Payer: Medicare Other | Source: Ambulatory Visit | Attending: Family Medicine | Admitting: Family Medicine

## 2018-07-31 ENCOUNTER — Encounter (HOSPITAL_COMMUNITY): Payer: Self-pay

## 2018-07-31 DIAGNOSIS — J439 Emphysema, unspecified: Secondary | ICD-10-CM | POA: Insufficient documentation

## 2018-07-31 DIAGNOSIS — R222 Localized swelling, mass and lump, trunk: Secondary | ICD-10-CM

## 2018-07-31 DIAGNOSIS — I700 Atherosclerosis of aorta: Secondary | ICD-10-CM | POA: Diagnosis not present

## 2018-07-31 DIAGNOSIS — R590 Localized enlarged lymph nodes: Secondary | ICD-10-CM | POA: Diagnosis not present

## 2018-07-31 NOTE — Telephone Encounter (Signed)
If he is having shortness of breath I would advise going to the ED.

## 2018-07-31 NOTE — Telephone Encounter (Signed)
Pt name and DOB verified. Spoke to patient in reference to SOB. He states that he is not better. No worsening SOB. He admits that he is tired. "Tired of taking all these medications, I'm tired."   He sounds sad, low, depressed. His wife is with him while he speaks on the phone. Advising him to ask questions.  Attempted to ask patient if he had any thoughts to hurt himself, however before doing so, patient was able to finish my sentence. He denies thoughts to hurt himself. He just states he is tired.  Asked patient if he would like to speak to Christa See, our LCSW, he does not wish to do so.   Pt reminded of appointment for CT Scan today. Reviewed the Cx-R report again with patient. He verbalized understanding. Instructed patient if results are critical, he would be informed of result note at the imaging center.   Pt was informed of referral being placed with Dr. Darleene Cleaver. Given phone number: [phone] to wife. Advised to call office to schedule an appointment.   Wife is very supportive and was around for entire conversation over the phone. She was encourage to look through pill container to make sure he is taking them as prescribed. Even advised to be aware that patient does not take too many medications although he feeling depressed. Pt wife verbalized understanding.   Dr. Margarita Rana:  Would you advise that the patient make an appointment to see [facility] for medication management?

## 2018-08-01 ENCOUNTER — Other Ambulatory Visit: Payer: Self-pay | Admitting: Family Medicine

## 2018-08-01 ENCOUNTER — Telehealth: Payer: Self-pay | Admitting: Family Medicine

## 2018-08-01 DIAGNOSIS — R918 Other nonspecific abnormal finding of lung field: Secondary | ICD-10-CM

## 2018-08-01 MED ORDER — ACETAMINOPHEN-CODEINE #3 300-30 MG PO TABS: 1.0000 | ORAL_TABLET | Freq: Two times a day (BID) | ORAL | 0 refills | Status: DC | PRN

## 2018-08-01 MED FILL — ACETAMINOPHEN/COD #3 TABLET: 300-30 | 7 days supply | Qty: 14 | Fill #0

## 2018-08-01 NOTE — Telephone Encounter (Signed)
Jane from radiology called stating that it was very important for this patient's nurse to call her back because there were some findings in the CT that the radiologist wanted to give to the nurse verbally. Please follow up as soon as possible.

## 2018-08-01 NOTE — Telephone Encounter (Signed)
Thanks for the update. He does not need to see [facility] for medication management as a lot of his medications are needed for his cardiac issues. He needs to be connected with Psych ASAP as he current antidepressants are not helping couupled with his multiple medical conditions. Thanks

## 2018-08-01 NOTE — Telephone Encounter (Signed)
Informed PCP.

## 2018-08-02 ENCOUNTER — Telehealth: Payer: Self-pay | Admitting: Family Medicine

## 2018-08-02 NOTE — Telephone Encounter (Signed)
Patient would like a nurse to call him back, patient did not specify why just that he would like a call back. Please follow up.

## 2018-08-04 ENCOUNTER — Telehealth: Payer: Self-pay

## 2018-08-04 ENCOUNTER — Encounter: Payer: Medicare Other | Attending: Physical Medicine & Rehabilitation | Admitting: Physical Medicine & Rehabilitation

## 2018-08-04 ENCOUNTER — Encounter: Payer: Self-pay | Admitting: Physical Medicine & Rehabilitation

## 2018-08-04 ENCOUNTER — Other Ambulatory Visit: Payer: Self-pay

## 2018-08-04 VITALS — BP 146/95 | HR 66 | Ht 69.0 in | Wt 249.0 lb

## 2018-08-04 DIAGNOSIS — I202 Refractory angina pectoris: Secondary | ICD-10-CM

## 2018-08-04 DIAGNOSIS — I252 Old myocardial infarction: Secondary | ICD-10-CM | POA: Diagnosis not present

## 2018-08-04 DIAGNOSIS — E539 Vitamin B deficiency, unspecified: Secondary | ICD-10-CM | POA: Diagnosis not present

## 2018-08-04 DIAGNOSIS — G479 Sleep disorder, unspecified: Secondary | ICD-10-CM | POA: Insufficient documentation

## 2018-08-04 DIAGNOSIS — M545 Low back pain: Secondary | ICD-10-CM | POA: Diagnosis not present

## 2018-08-04 DIAGNOSIS — E1140 Type 2 diabetes mellitus with diabetic neuropathy, unspecified: Secondary | ICD-10-CM | POA: Diagnosis not present

## 2018-08-04 DIAGNOSIS — G8929 Other chronic pain: Secondary | ICD-10-CM | POA: Diagnosis not present

## 2018-08-04 DIAGNOSIS — M5010 Cervical disc disorder with radiculopathy, unspecified cervical region: Secondary | ICD-10-CM | POA: Diagnosis not present

## 2018-08-04 DIAGNOSIS — E785 Hyperlipidemia, unspecified: Secondary | ICD-10-CM | POA: Insufficient documentation

## 2018-08-04 DIAGNOSIS — K219 Gastro-esophageal reflux disease without esophagitis: Secondary | ICD-10-CM | POA: Diagnosis not present

## 2018-08-04 DIAGNOSIS — I2510 Atherosclerotic heart disease of native coronary artery without angina pectoris: Secondary | ICD-10-CM | POA: Insufficient documentation

## 2018-08-04 DIAGNOSIS — Z9861 Coronary angioplasty status: Secondary | ICD-10-CM

## 2018-08-04 DIAGNOSIS — F419 Anxiety disorder, unspecified: Secondary | ICD-10-CM | POA: Diagnosis not present

## 2018-08-04 DIAGNOSIS — J449 Chronic obstructive pulmonary disease, unspecified: Secondary | ICD-10-CM | POA: Insufficient documentation

## 2018-08-04 DIAGNOSIS — I10 Essential (primary) hypertension: Secondary | ICD-10-CM | POA: Insufficient documentation

## 2018-08-04 DIAGNOSIS — I208 Other forms of angina pectoris: Secondary | ICD-10-CM | POA: Diagnosis not present

## 2018-08-04 DIAGNOSIS — Z87891 Personal history of nicotine dependence: Secondary | ICD-10-CM | POA: Diagnosis not present

## 2018-08-04 DIAGNOSIS — F329 Major depressive disorder, single episode, unspecified: Secondary | ICD-10-CM | POA: Diagnosis not present

## 2018-08-04 DIAGNOSIS — R0789 Other chest pain: Secondary | ICD-10-CM | POA: Insufficient documentation

## 2018-08-04 DIAGNOSIS — R072 Precordial pain: Secondary | ICD-10-CM | POA: Diagnosis not present

## 2018-08-04 DIAGNOSIS — E538 Deficiency of other specified B group vitamins: Secondary | ICD-10-CM

## 2018-08-04 DIAGNOSIS — E1142 Type 2 diabetes mellitus with diabetic polyneuropathy: Secondary | ICD-10-CM

## 2018-08-04 DIAGNOSIS — E559 Vitamin D deficiency, unspecified: Secondary | ICD-10-CM | POA: Insufficient documentation

## 2018-08-04 MED ORDER — VITAMIN D 50 MCG (2000 UT) PO TABS: 2000.0000 [IU] | ORAL_TABLET | Freq: Every day | ORAL | 1 refills | Status: DC

## 2018-08-04 NOTE — Telephone Encounter (Signed)
Patient was called and informed of PET scan appointment.

## 2018-08-04 NOTE — Telephone Encounter (Signed)
Patient's call was returned and patient informed me that he is having SOB and chest pains, patient was informed to go to ED.

## 2018-08-04 NOTE — Progress Notes (Addendum)
Subjective:    Patient ID: Andrew West, male    DOB: January 18, 1966, 52 y.o.   MRN: [id number]  HPI 52 y/o male with pmh of DM, peripheral neuropathy, lumbar discitis, cervical, OSA, HTN, GERD, CAD with MI, chronic low back pain present with chest wall pain.   Initially stated: Started ~2018.  He does note 3 heart attacks with several stents and interventions, most recently around 2017/2018.  Nitroglycerin helps but causes headache.  Denies exacerbating factors, however, per report exertion exacerbates.  Radiates to left arm.  Constant.  Associated numbness, tingling, weakness.  Percocet helps.  Nitro, oxycodone/oxycontine, Tylenol #3 help.  Tramadol, Gabapentin, Lyrica does not help.  Denies falls.  Pain limites endurance activities.    Last clinic visit 06/30/18. Since that time and several communications regarding medications to place. Patient has also been to the PCP and had complaints of chest pain and shortness of breath, being repeatedly encouraged to present to the ED. He states he never went because it was going to be a waste of time. Also noted to be depressed and recommended for follow up with psychiatry.  No benefit with ice. He was not able to tolerate Cymbalta. No benefit with Lyrica. He did not try Baclofen. He obtained Vt D/Mag levels. He states he does not have transportation to [facility] for [facility]. He had a CT and is supposed to have a biopsy.  Pain Inventory Average Pain 8 Pain Right Now 8 My pain is sharp, burning, tingling and aching  In the last 24 hours, has pain interfered with the following? General activity 1 Relation with others 8 Enjoyment of life 3 What TIME of day is your pain at its worst? all Sleep (in general) Poor  Pain is worse with: walking, bending, sitting, standing and some activites Pain improves with: medication Relief from Meds: 5  Mobility walk without assistance ability to climb steps?  no do you drive?  no  Function disabled: date  disabled 2015 I need assistance with the following:  household duties and shopping  Neuro/Psych bladder control problems weakness numbness tremor tingling trouble walking dizziness confusion depression anxiety loss of taste or smell suicidal thoughts - States did have suicidal thoughts but did not make plan  Prior Studies New patient  Physicians involved in your care New patient   Family History  Problem Relation Age of Onset  . Heart attack Father   . Hypertension Mother   . Diabetes Mother   . Heart disease Brother        x 3   . Heart attack Brother        deceased  . Hypertension Sister   . Diabetes Sister   . Anesthesia problems Neg Hx   . Hypotension Neg Hx   . Malignant hyperthermia Neg Hx   . Pseudochol deficiency Neg Hx    Social History   Socioeconomic History  . Marital status: Married    Spouse name: Not on file  . Number of children: Not on file  . Years of education: Not on file  . Highest education level: Not on file  Occupational History  . Not on file  Social Needs  . Financial resource strain: Not on file  . Food insecurity:    Worry: Not on file    Inability: Not on file  . Transportation needs:    Medical: Not on file    Non-medical: Not on file  Tobacco Use  . Smoking status: Former Smoker  Packs/day: 0.25    Years: 25.00    Pack years: 6.25    Types: Cigarettes    Last attempt to quit: 05/24/2015    Years since quitting: 3.2  . Smokeless tobacco: Never Used  Substance and Sexual Activity  . Alcohol use: No    Alcohol/week: 0.0 standard drinks  . Drug use: No  . Sexual activity: Yes  Lifestyle  . Physical activity:    Days per week: Not on file    Minutes per session: Not on file  . Stress: Not on file  Relationships  . Social connections:    Talks on phone: Not on file    Gets together: Not on file    Attends religious service: Not on file    Active member of club or organization: Not on file    Attends meetings  of clubs or organizations: Not on file    Relationship status: Not on file  Other Topics Concern  . Not on file  Social History Narrative  . Not on file   Past Surgical History:  Procedure Laterality Date  . BACK SURGERY    . CARDIAC CATHETERIZATION N/A 09/25/2015   Procedure: Left Heart Cath and Coronary Angiography;  Surgeon: Marykay Lex, MD;  Location: [facility] INVASIVE CV LAB;  Service: Cardiovascular;  Laterality: N/A;  . CARDIAC CATHETERIZATION N/A 12/16/2016   Procedure: Left Heart Cath and Coronary Angiography;  Surgeon: Marykay Lex, MD;  Location: [facility] INVASIVE CV LAB;  Service: Cardiovascular;  Laterality: N/A;  . CARDIAC CATHETERIZATION N/A 12/16/2016   Procedure: Coronary Balloon Angioplasty;  Surgeon: Marykay Lex, MD;  Location: [facility] INVASIVE CV LAB;  Service: Cardiovascular;  Laterality: N/A;  . COLONOSCOPY W/ POLYPECTOMY    . CORONARY ANGIOPLASTY  09/25/2015   mid cir & om  . CORONARY ANGIOPLASTY WITH STENT PLACEMENT  10/09/2001   PTCA & stenting of mid AV circumflex; 2.5x23mm Pixel stent  . CORONARY ANGIOPLASTY WITH STENT PLACEMENT  12/13/2001   PCI with stent to mid L circumflex, 95% stenosis to 0% residual  . CORONARY ANGIOPLASTY WITH STENT PLACEMENT  10/10/2003   PCI to mid AV circumflex; LAD 30% disease; RCA 100% occluded prox.  . CORONARY ANGIOPLASTY WITH STENT PLACEMENT  09/01/2011   PCI with stenting with bare metal stent to mid AV groove circumflex and PDA  . CORONARY ANGIOPLASTY WITH STENT PLACEMENT  10/17/2011   cutting balloon angioplasty of ostial lateral OM1 branch and bifurcation AV groove circumflex OM junction; stenosis reduced to 0%  . EXCISIONAL HEMORRHOIDECTOMY    . LEFT HEART CATHETERIZATION WITH CORONARY ANGIOGRAM N/A 10/18/2011   Procedure: LEFT HEART CATHETERIZATION WITH CORONARY ANGIOGRAM;  Surgeon: Marykay Lex, MD;  Location: [facility] CATH LAB;  Service: Cardiovascular;  Laterality: N/A;  . LUMBAR LAMINECTOMY/DECOMPRESSION MICRODISCECTOMY   03/31/2012   Procedure: LUMBAR LAMINECTOMY/DECOMPRESSION MICRODISCECTOMY 1 LEVEL;  Surgeon: Temple Pacini, MD;  Location: [facility] NEURO ORS;  Service: Neurosurgery;  Laterality: Left;  . TRANSTHORACIC ECHOCARDIOGRAM  07/28/2011   EF 55-65%; LVH, grade 1 diastolic dysfunction;    Past Medical History:  Diagnosis Date  . Anxiety   . CAD (coronary artery disease)    a. s/p multiple PCIs with last cath 11/2016 with severe multivessel CAD, s/p PCTA to LCx but unable to pass stent  . Chronic leg pain    bilateral  . Chronic lower back pain   . COPD (chronic obstructive pulmonary disease) (HCC)   . Depression   . GERD (gastroesophageal reflux disease)  Takes Dexilant  . HLD (hyperlipidemia)   . Hypertension   . Rhabdomyolysis    h/o, r/t statins  . Sleep apnea    "can't tolerate mask" (12/16/2016)  . Type II diabetes mellitus (HCC)    BP (!) 146/95   Pulse 66   Ht 5\' 9"  (1.753 m)   Wt 249 lb (112.9 kg)   SpO2 92%   BMI 36.77 kg/m   Opioid Risk Score:   Fall Risk Score:  `1  Depression screen PHQ 2/9  Depression screen [facility] 2/9 08/04/2018 10/31/2015 10/01/2015 10/01/2015  Decreased Interest 1 0 1 1  Down, Depressed, Hopeless 1 0 1 1  PHQ - 2 Score 2 0 2 2  Altered sleeping - - 3 -  Tired, decreased energy - - 2 -  Change in appetite - - 3 -  Feeling bad or failure about yourself  - - 3 -  Trouble concentrating - - 3 -  Moving slowly or fidgety/restless - - 3 -  Suicidal thoughts - - 3 -  PHQ-9 Score - - 22 -  Some recent data might be hidden     Review of Systems  Constitutional: Positive for appetite change, diaphoresis and unexpected weight change.  HENT: Negative.        History of URIs  Eyes: Negative.   Respiratory: Positive for apnea, cough, shortness of breath and wheezing.        Sleep apnea - cpap  Cardiovascular: Negative.   Gastrointestinal: Positive for nausea.  Endocrine: Negative.   Genitourinary: Positive for difficulty urinating.  Musculoskeletal: Positive  for gait problem.  Skin: Negative.   Allergic/Immunologic: Negative.   Neurological: Positive for dizziness, tremors, weakness and numbness.  Hematological: Bruises/bleeds easily.  Psychiatric/Behavioral: Positive for confusion, dysphoric mood and suicidal ideas. The patient is nervous/anxious.        States did have suicidal thoughts but did not make plan  All other systems reviewed and are negative.     Objective:   Physical Exam Gen: NAD. Vital signs reviewed HENT: Normocephalic, Atraumatic Eyes: EOMI. No discharge.  Cardio: RRR. No JVD. Pulm: B/l clear to auscultation.  Effort normal Abd: Nondistended, BS+ MSK:  Gait WNL.     No edema.  Neuro:  Sensation diminished to light touch T4 dermatomal distrubution  Strength  5/5 in all UE myotomes  Hyperalgesia mid chest wall Skin: Scaring right forearm Psych: Depressed    Assessment & Plan:  52 y/o male with pmh of DM, peripheral neuropathy, lumbar discitis, cervical, OSA, HTN, GERD, CAD with MI, chronic low back pain present with chest wall pain.  1. Chronic chest wall pain  Multifactorial Cardiac + MSK +/- Neuropathic  CT 2017 reviewed, repeat CT reviewed with lymphadenopathy and mass  Labs reviewed  Heat, cold, Lyrica - d/ced, Gabapentin, Robaxin, Flexaril, Tramdol ineffective  Could not tolerate Cymbalta  Will consider TENS after results of biopsy  Ordered Baclofen 10 TID PRN, reminded  He is scheduled to see a Psychology  Will consider CT cervical myelogram  Cont follow up with [facility] with plans for [facility], states needs funds for transportation  Will attempt to avoid Lidocaine due to position and potential absorption  Will consider intercostal nerve block   2. Sleep disturbance  Will consider Elavil 10 in future, will attempt to avoid if possible due to other concurrent Psych meds  3. Vitamin B12 deficiency  Cont daily supplement  4. Diabetic neuropathy  See #1  5. Vitamin D deficiency  22.7 on  07/2018  Supplement ordered  6. SI  Pt to follow up with Psych  Discussed briefly coping, he states he reminds himself of his family.  He states he is following up with PCP as well

## 2018-08-04 NOTE — Telephone Encounter (Signed)
Noted  

## 2018-08-04 NOTE — Telephone Encounter (Signed)
-----   Message from [name], MD sent at 08/01/2018  1:34 PM EDT ----- Could you please schedule a PET scan for this patient urgently and also get with Alinda Sierras to schedule an urgent referral to pulmonary?  I discussed his CT report with him over the phone.Thanks

## 2018-08-04 NOTE — Telephone Encounter (Signed)
Call placed to [place] at the  [place] to inquire about the status of the referral. Voicemail message left requesting a call back to # [phone].

## 2018-08-07 MED FILL — ACETAMINOPHEN/COD #3 TABLET: 300-30 | 7 days supply | Qty: 14 | Fill #1

## 2018-08-08 ENCOUNTER — Encounter (HOSPITAL_COMMUNITY): Payer: Self-pay

## 2018-08-08 ENCOUNTER — Other Ambulatory Visit: Payer: Self-pay

## 2018-08-08 ENCOUNTER — Telehealth: Payer: Self-pay | Admitting: Family Medicine

## 2018-08-08 ENCOUNTER — Emergency Department (HOSPITAL_COMMUNITY)
Admission: EM | Admit: 2018-08-08 | Discharge: 2018-08-08 | Disposition: A | Discharge status: Home / Self Care | Payer: Medicare Other | Attending: Emergency Medicine | Admitting: Emergency Medicine

## 2018-08-08 ENCOUNTER — Emergency Department (HOSPITAL_COMMUNITY): Payer: Medicare Other

## 2018-08-08 DIAGNOSIS — J449 Chronic obstructive pulmonary disease, unspecified: Secondary | ICD-10-CM | POA: Diagnosis not present

## 2018-08-08 DIAGNOSIS — Z87891 Personal history of nicotine dependence: Secondary | ICD-10-CM | POA: Insufficient documentation

## 2018-08-08 DIAGNOSIS — Z7982 Long term (current) use of aspirin: Secondary | ICD-10-CM | POA: Insufficient documentation

## 2018-08-08 DIAGNOSIS — I5030 Unspecified diastolic (congestive) heart failure: Secondary | ICD-10-CM | POA: Diagnosis not present

## 2018-08-08 DIAGNOSIS — Z7984 Long term (current) use of oral hypoglycemic drugs: Secondary | ICD-10-CM | POA: Insufficient documentation

## 2018-08-08 DIAGNOSIS — Z79899 Other long term (current) drug therapy: Secondary | ICD-10-CM | POA: Diagnosis not present

## 2018-08-08 DIAGNOSIS — I110 Hypertensive heart disease with heart failure: Secondary | ICD-10-CM | POA: Insufficient documentation

## 2018-08-08 DIAGNOSIS — Z955 Presence of coronary angioplasty implant and graft: Secondary | ICD-10-CM | POA: Insufficient documentation

## 2018-08-08 DIAGNOSIS — E119 Type 2 diabetes mellitus without complications: Secondary | ICD-10-CM | POA: Diagnosis not present

## 2018-08-08 DIAGNOSIS — I2510 Atherosclerotic heart disease of native coronary artery without angina pectoris: Secondary | ICD-10-CM | POA: Insufficient documentation

## 2018-08-08 DIAGNOSIS — Z7902 Long term (current) use of antithrombotics/antiplatelets: Secondary | ICD-10-CM | POA: Insufficient documentation

## 2018-08-08 DIAGNOSIS — R0789 Other chest pain: Secondary | ICD-10-CM | POA: Insufficient documentation

## 2018-08-08 DIAGNOSIS — R222 Localized swelling, mass and lump, trunk: Secondary | ICD-10-CM | POA: Diagnosis not present

## 2018-08-08 LAB — CBC
HEMATOCRIT: 40.5 % (ref 39.0–52.0)
HEMOGLOBIN: 13.5 g/dL (ref 13.0–17.0)
MCH: 27.2 pg (ref 26.0–34.0)
MCHC: 33.3 g/dL (ref 30.0–36.0)
MCV: 81.5 fL (ref 78.0–100.0)
Platelets: 230 10*3/uL (ref 150–400)
RBC: 4.97 MIL/uL (ref 4.22–5.81)
RDW: 14.6 % (ref 11.5–15.5)
WBC: 7.3 10*3/uL (ref 4.0–10.5)

## 2018-08-08 LAB — D-DIMER, QUANTITATIVE: D-Dimer, Quant: 0.33 ug/mL-FEU (ref 0.00–0.50)

## 2018-08-08 LAB — BASIC METABOLIC PANEL
ANION GAP: 11 (ref 5–15)
BUN: 16 mg/dL (ref 6–20)
CHLORIDE: 108 mmol/L (ref 98–111)
CO2: 28 mmol/L (ref 22–32)
Calcium: 8.9 mg/dL (ref 8.9–10.3)
Creatinine, Ser: 1.16 mg/dL (ref 0.61–1.24)
GFR calc Af Amer: >60 mL/min (ref >60)
GFR calc non Af Amer: >60 mL/min (ref >60)
GLUCOSE: 116 mg/dL — AB (ref 70–99)
POTASSIUM: 3.5 mmol/L (ref 3.5–5.1)
Sodium: 147 mmol/L — ABNORMAL HIGH (ref 135–145)

## 2018-08-08 LAB — I-STAT TROPONIN, ED: Troponin i, poc: 0 ng/mL (ref 0.00–0.08)

## 2018-08-08 MED ORDER — HYDROCODONE-ACETAMINOPHEN 5-325 MG PO TABS: 1.0000 | ORAL_TABLET | ORAL | 0 refills | Status: DC | PRN

## 2018-08-08 MED ORDER — MORPHINE SULFATE (PF) 4 MG/ML IV SOLN
4.0000 mg | Freq: Once | INTRAVENOUS | Status: AC
  Administered 2018-08-08: 4 mg via INTRAVENOUS
  Filled 2018-08-08: qty 1

## 2018-08-08 MED ORDER — KETOROLAC TROMETHAMINE 30 MG/ML IJ SOLN
30.0000 mg | Freq: Once | INTRAMUSCULAR | Status: AC
  Administered 2018-08-08: 30 mg via INTRAVENOUS
  Filled 2018-08-08: qty 1

## 2018-08-08 MED ORDER — ONDANSETRON HCL 4 MG/2ML IJ SOLN
4.0000 mg | Freq: Once | INTRAMUSCULAR | Status: AC
  Administered 2018-08-08: 4 mg via INTRAVENOUS
  Filled 2018-08-08: qty 2

## 2018-08-08 NOTE — ED Provider Notes (Signed)
[facility] DEPT Provider Note   CSN: [id number] Arrival date & time: 08/08/18  1856     History   Chief Complaint Chief Complaint  Patient presents with  . Chest Pain    HPI Andrew West is a 52 y.o. male.  Pt presents to the ED today with right sided cp.  Pt said he's had it for months.  He has seen been to the ED and has seen his PCP for sx.  He has a mass on the right side of his chest that is getting worked up.  He is scheduled for a PET scan on Friday the 20th.  The pt said the pain seems to be getting a little worse, which is why he's here today.     Past Medical History:  Diagnosis Date  . Anxiety   . CAD (coronary artery disease)    a. s/p multiple PCIs with last cath 11/2016 with severe multivessel CAD, s/p PCTA to LCx but unable to pass stent  . Chronic leg pain    bilateral  . Chronic lower back pain   . COPD (chronic obstructive pulmonary disease) ([place])   . Depression   . GERD (gastroesophageal reflux disease)    Takes Dexilant  . HLD (hyperlipidemia)   . Hypertension   . Rhabdomyolysis    h/o, r/t statins  . Sleep apnea    "can't tolerate mask" (12/16/2016)  . Type II diabetes mellitus [facility])     Patient Active Problem List   Diagnosis Date Noted  . Vitamin D deficiency 08/04/2018  . Diabetic peripheral neuropathy ([place]) 06/30/2018  . Substernal chest pain 02/25/2018  . Benign prostatic hyperplasia with urinary hesitancy 11/17/2017  . Preoperative cardiovascular examination 07/12/2017  . Refractory angina ([place]) 05/24/2017  . Complex regional pain syndrome type I 03/24/2017  . Progressive angina ([place]) -Class III 12/16/2016  . Anxiety 06/28/2016  . Diastolic dysfunction-grade 2 with EF 60-65% Oct 2016 03/22/2016  . Hypertension 10/03/2015  . CAD S/P multiple PCI's 10/03/2015  . Pulmonary nodule 06/24/2015  . Cough 06/24/2015  . COPD with asthma ([place]) 06/24/2015  . Reactive airway disease 06/04/2015  . DOE  (dyspnea on exertion) 04/15/2015  . Lumbar disc herniation with radiculopathy 03/31/2012  . Presence of stent in left circumflex coronary artery 10/18/2011    Class: History of  . TOBACCO ABUSE 02/27/2009  . ABDOMINAL PAIN, LEFT LOWER QUADRANT 12/30/2008  . ABDOMINAL PAIN, EPIGASTRIC 12/05/2008  . Anxiety and depression 09/05/2008  . Hereditary and idiopathic peripheral neuropathy 09/05/2008  . GERD 09/05/2008  . Cervical disc disorder with radiculopathy of cervical region 08/19/2008  . HLD (hyperlipidemia) 04/25/2008    Class: Diagnosis of  . ALLERGIC RHINITIS 04/25/2008  . Backache 04/25/2008  . Chest pain, unspecified 04/25/2008  . COLONIC POLYPS, HX OF 04/25/2008  . Obesity 04/18/2008    Class: Diagnosis of  . ANAL FISSURE, HX OF 04/18/2008  . Diabetes mellitus ([place]) 01/30/2008    Class: History of  . RECTAL BLEEDING 01/30/2008    Past Surgical History:  Procedure Laterality Date  . BACK SURGERY    . CARDIAC CATHETERIZATION N/A 09/25/2015   Procedure: Left Heart Cath and Coronary Angiography;  Surgeon: Leonie Man, MD;  Location: [place] CV LAB;  Service: Cardiovascular;  Laterality: N/A;  . CARDIAC CATHETERIZATION N/A 12/16/2016   Procedure: Left Heart Cath and Coronary Angiography;  Surgeon: Leonie Man, MD;  Location: [place] CV LAB;  Service: Cardiovascular;  Laterality: N/A;  .  CARDIAC CATHETERIZATION N/A 12/16/2016   Procedure: Coronary Balloon Angioplasty;  Surgeon: Leonie Man, MD;  Location: [place] CV LAB;  Service: Cardiovascular;  Laterality: N/A;  . COLONOSCOPY W/ POLYPECTOMY    . CORONARY ANGIOPLASTY  09/25/2015   mid cir & om  . CORONARY ANGIOPLASTY WITH STENT PLACEMENT  10/09/2001   PTCA & stenting of mid AV circumflex; 2.5x25m Pixel stent  . CORONARY ANGIOPLASTY WITH STENT PLACEMENT  12/13/2001   PCI with stent to mid L circumflex, 95% stenosis to 0% residual  . CORONARY ANGIOPLASTY WITH STENT PLACEMENT  10/10/2003   PCI to mid AV  circumflex; LAD 30% disease; RCA 100% occluded prox.  . CORONARY ANGIOPLASTY WITH STENT PLACEMENT  09/01/2011   PCI with stenting with bare metal stent to mid AV groove circumflex and PDA  . CORONARY ANGIOPLASTY WITH STENT PLACEMENT  10/17/2011   cutting balloon angioplasty of ostial lateral OM1 branch and bifurcation AV groove circumflex OM junction; stenosis reduced to 0%  . EXCISIONAL HEMORRHOIDECTOMY    . LEFT HEART CATHETERIZATION WITH CORONARY ANGIOGRAM N/A 10/18/2011   Procedure: LEFT HEART CATHETERIZATION WITH CORONARY ANGIOGRAM;  Surgeon: DLeonie Man MD;  [facility];  Service: Cardiovascular;  Laterality: N/A;  . LUMBAR LAMINECTOMY/DECOMPRESSION MICRODISCECTOMY  03/31/2012   Procedure: LUMBAR LAMINECTOMY/DECOMPRESSION MICRODISCECTOMY 1 LEVEL;  Surgeon: HCharlie Pitter MD;  [facility];  Service: Neurosurgery;  Laterality: Left;  . TRANSTHORACIC ECHOCARDIOGRAM  07/28/2011   EF 55-65%; LVH, grade 1 diastolic dysfunction;         Home Medications    Prior to Admission medications   Medication Sig Start Date End Date Taking? Authorizing Provider  Albuterol Sulfate (PROAIR RESPICLICK) 1709(90 Base) MCG/ACT AEPB Inhale 1 puff into the lungs every 6 (six) hours as needed (shortness of breath). 07/25/18  Yes NCharlott Rakes MD  amLODipine (NORVASC) 10 MG tablet Take 1 tablet (10 mg total) by mouth daily. 04/20/18  Yes NCharlott Rakes MD  aspirin 81 MG tablet Take 1 tablet (81 mg total) daily by mouth. 10/10/17  Yes Newlin, Enobong, MD  baclofen (LIORESAL) 10 MG tablet Take 1 tablet (10 mg total) by mouth 3 (three) times daily. 06/30/18  Yes PJamse Arn MD  Cholecalciferol (VITAMIN D) 2000 units tablet Take 1 tablet (2,000 Units total) by mouth daily. 08/04/18  Yes PJamse Arn MD  clopidogrel (PLAVIX) 75 MG tablet Take 1 tablet (75 mg total) by mouth daily. 04/20/18  Yes NCharlott Rakes MD  diclofenac sodium (VOLTAREN) 1 % GEL Apply 2 g topically 4 (four)  times daily. 04/20/18  Yes NCharlott Rakes MD  FLUoxetine (PROZAC) 40 MG capsule Take 1 capsule (40 mg total) by mouth daily. 04/20/18  Yes Newlin, ECharlane Ferretti MD  fluticasone (FLONASE) 50 MCG/ACT nasal spray Place 2 sprays into both nostrils daily. Patient taking differently: Place 2 sprays into both nostrils daily as needed for allergies.  02/24/17  Yes McClung, Angela M, PA-C  Fluticasone-Salmeterol (ADVAIR) 100-50 MCG/DOSE AEPB Inhale 1 puff into the lungs 2 (two) times daily. 07/25/18  Yes NCharlott Rakes MD  furosemide (LASIX) 20 MG tablet Take 20 mg by mouth as needed.    Yes [provider]  hydrALAZINE (APRESOLINE) 25 MG tablet Take 1 tablet (25 mg total) by mouth 2 (two) times daily. 04/20/18  Yes Newlin, ECharlane Ferretti MD  hydrocortisone-pramoxine (ANALPRAM-HC) 2.5-1 % rectal cream PLACE 1 APPLICATION RECTALLY 3 (THREE) TIMES DAILY. Patient taking differently: Place 1 application rectally as needed.  08/25/17  Yes [name], MD  isosorbide mononitrate (IMDUR) 120 MG 24 hr tablet Take 1 tablet (120 mg total) by mouth daily. Patient taking differently: Take 60 mg by mouth 2 (two) times daily.  04/20/18  Yes Newlin, Charlane Ferretti, MD  losartan-hydrochlorothiazide (HYZAAR) 100-25 MG tablet Take 1 tablet by mouth daily. 04/20/18  Yes [name], MD  metFORMIN (GLUCOPHAGE) 1000 MG tablet Take 1 tablet (1,000 mg total) by mouth 2 (two) times daily with a meal. 04/20/18  Yes Newlin, Enobong, MD  Nebivolol HCl 20 MG TABS Take 1 tablet (20 mg total) by mouth daily. 01/26/18  Yes Hilty, Nadean Corwin, MD  pantoprazole (PROTONIX) 40 MG tablet Take 1 tablet (40 mg total) by mouth 2 (two) times daily. 04/20/18  Yes [name], MD  prazosin (MINIPRESS) 1 MG capsule Take 1 capsule (1 mg total) by mouth at bedtime. For nightmares 07/25/18  Yes [name], MD  ranolazine (RANEXA) 1000 MG SR tablet Take 1 tablet (1,000 mg total) by mouth 2 (two) times daily. Patient taking differently: Take 1,000 mg by mouth  daily.  05/24/17  Yes Hilty, Nadean Corwin, MD  rosuvastatin (CRESTOR) 20 MG tablet Take 1 tablet (20 mg total) by mouth daily. 07/26/18  Yes [name], MD  tiotropium (SPIRIVA HANDIHALER) 18 MCG inhalation capsule Place 1 capsule (18 mcg total) into inhaler and inhale daily. 04/20/18  Yes [name], MD  traZODone (DESYREL) 50 MG tablet Take 1 tablet (50 mg total) by mouth at bedtime as needed. for sleep 07/25/18  Yes [name], MD  vitamin B-12 (CYANOCOBALAMIN) 500 MCG tablet Take 1 tablet (500 mcg total) by mouth daily. 06/30/18  Yes Jamse Arn, MD  acetaminophen-codeine (TYLENOL #3) 300-30 MG tablet Take 1 tablet by mouth every 12 (twelve) hours as needed for moderate pain. Patient not taking: Reported on 08/08/2018 08/01/18   [name], MD  benzonatate (TESSALON) 100 MG capsule Take 1 capsule (100 mg total) by mouth every 8 (eight) hours. Patient not taking: Reported on 08/08/2018 01/17/18   Larene Pickett, PA-C  Blood Glucose Monitoring Suppl (ACCU-CHEK AVIVA PLUS) w/Device KIT Use as dircted 07/12/17   [name], MD  cetirizine (ZYRTEC) 10 MG tablet Take 1 tablet (10 mg total) daily by mouth. Patient not taking: Reported on 08/08/2018 10/10/17   [name], MD  glucose blood (ACCU-CHEK AVIVA) test strip Use as instructed 07/12/17   [name], MD  HYDROcodone-acetaminophen (NORCO/VICODIN) 5-325 MG tablet Take 1 tablet by mouth every 4 (four) hours as needed. 08/08/18   Isla Pence, MD  nitroGLYCERIN (NITROSTAT) 0.4 MG SL tablet Place 1 tablet (0.4 mg total) under the tongue every 5 (five) minutes as needed for chest pain. 12/24/16   Ahmed Prima, Fransisco Hertz, PA-C  tamsulosin (FLOMAX) 0.4 MG CAPS capsule Take 1 capsule (0.4 mg total) by mouth daily. Patient not taking: Reported on 08/08/2018 07/25/18   [name], MD    Family History Family History  Problem Relation Age of Onset  . Heart attack Father   . Hypertension Mother   . Diabetes Mother   . Heart  disease Brother        x 3   . Heart attack Brother        deceased  . Hypertension Sister   . Diabetes Sister   . Anesthesia problems Neg Hx   . Hypotension Neg Hx   . Malignant hyperthermia Neg Hx   . Pseudochol deficiency Neg Hx     Social History Social History  Tobacco Use  . Smoking status: Former Smoker    Packs/day: 0.25    Years: 25.00    Pack years: 6.25    Types: Cigarettes    Last attempt to quit: 05/24/2015    Years since quitting: 3.2  . Smokeless tobacco: Never Used  Substance Use Topics  . Alcohol use: No    Alcohol/week: 0.0 standard drinks  . Drug use: No     Allergies   Iohexol   Review of Systems Review of Systems  Cardiovascular: Positive for chest pain.  All other systems reviewed and are negative.    Physical Exam Updated Vital Signs BP 117/81   Pulse 70   Temp 98 F (36.7 C) (Oral)   Resp 19   SpO2 97%   Physical Exam  Constitutional: He is oriented to person, place, and time. He appears well-developed and well-nourished.  HENT:  Head: Normocephalic and atraumatic.  Eyes: Pupils are equal, round, and reactive to light. EOM are normal.  Neck: Normal range of motion. Neck supple.  Cardiovascular: Normal rate, regular rhythm, intact distal pulses and normal pulses.  Pulmonary/Chest: Effort normal and breath sounds normal.    Abdominal: Soft. Bowel sounds are normal.  Musculoskeletal: Normal range of motion.       Right lower leg: Normal.       Left lower leg: Normal.  Neurological: He is alert and oriented to person, place, and time.  Skin: Skin is warm and dry. Capillary refill takes less than 2 seconds.  Psychiatric: He has a normal mood and affect. His behavior is normal.  Nursing note and vitals reviewed.    ED Treatments / Results  Labs (all labs ordered are listed, but only abnormal results are displayed) Labs Reviewed  BASIC METABOLIC PANEL - Abnormal; Notable for the following components:      Result Value    Sodium 147 (*)    Glucose, Bld 116 (*)    All other components within normal limits  CBC  D-DIMER, QUANTITATIVE (NOT AT [facility])  I-STAT TROPONIN, ED    EKG EKG Interpretation  Date/Time:  Tuesday August 08 2018 19:11:20 EDT Ventricular Rate:  69 PR Interval:    QRS Duration: 115 QT Interval:  434 QTC Calculation: 465 R Axis:   69 Text Interpretation:  Sinus rhythm Left atrial enlargement Nonspecific intraventricular conduction delay Minimal ST elevation, anterior leads No significant change since last tracing Confirmed by Isla Pence [phone]) on 08/08/2018 7:23:31 PM   Radiology Dg Chest 2 View  Result Date: 08/08/2018 CLINICAL DATA:  Worsening pain for 2 days. Follow-up lymphadenopathy and upper lobe mass. EXAM: CHEST - 2 VIEW COMPARISON:  CT chest July 31, 2018 FINDINGS: Fullness of the RIGHT paratracheal stripe and RIGHT hilum corresponding to lymphadenopathy on recent CT. No pleural effusion or focal consolidation. No pneumothorax. Soft tissue planes and included osseous structures are unchanged. Faint calcifications RIGHT neck are likely vascular. IMPRESSION: 1. No acute cardiopulmonary process. 2. lymphadenopathy better demonstrated on recent CT. Aortic Atherosclerosis (ICD10-I70.0). Electronically Signed   By: Elon Alas M.D.   On: 08/08/2018 20:25    Procedures Procedures (including critical care time)  Medications Ordered in ED Medications  morphine 4 MG/ML injection 4 mg (has no administration in time range)  ondansetron (ZOFRAN) injection 4 mg (has no administration in time range)  ketorolac (TORADOL) 30 MG/ML injection 30 mg (30 mg Intravenous Given 08/08/18 1944)     Initial Impression / Assessment and Plan / ED Course  I have  reviewed the triage vital signs and the nursing notes.  Pertinent labs & imaging results that were available during my care of the patient were reviewed by me and considered in my medical decision making (see chart for  details).   Pt said he's been very worried about what is going on in his chest.  He said his pcp gave him tylenol #3 and that has not helped his pain.  Pain today does not seem cardiac.  It has been going on for months.  He is encouraged to keep his appt for his PET scan and ct angio.  Return if worse.   Final Clinical Impressions(s) / ED Diagnoses   Final diagnoses:  Atypical chest pain    ED Discharge Orders         Ordered    HYDROcodone-acetaminophen (NORCO/VICODIN) 5-325 MG tablet  Every 4 hours PRN     08/08/18 2039           Isla Pence, MD 08/08/18 2039

## 2018-08-08 NOTE — Telephone Encounter (Signed)
Pre cert has been done and department has been notified.

## 2018-08-08 NOTE — ED Triage Notes (Signed)
He c/o right-sided chest pain for a few days now. He states he is to undergo a P.E.T. Scan "for something in my (right) lung" this Friday". He is in no distress.

## 2018-08-08 NOTE — Telephone Encounter (Signed)
Nurse from Pre-service at [place] called to request a pre-cert be completed on pt's Imaging appointmentt for 08/11/2018. Please follow up

## 2018-08-09 ENCOUNTER — Telehealth: Payer: Self-pay | Admitting: Family Medicine

## 2018-08-09 MED FILL — FLUoxetine HCL 40 MG CAPS: 40 | 30 days supply | Qty: 30 | Fill #2

## 2018-08-09 MED FILL — BACLOFEN 10 MG TABLET: 10 | 30 days supply | Qty: 90 | Fill #1

## 2018-08-09 MED FILL — CLOPIDOGREL 75 MG TABLET: 75 | 30 days supply | Qty: 30 | Fill #2

## 2018-08-09 MED FILL — PANTOPRAZOLE SOD DR 40 MG T: 40 | 30 days supply | Qty: 60 | Fill #2

## 2018-08-09 MED FILL — VALSARTAN-HCTZ 320-25 MG TA: 320-25 | 30 days supply | Qty: 30 | Fill #2

## 2018-08-09 NOTE — Telephone Encounter (Signed)
Threasa Beards called because the CPT code needs to be changed because the one they are using is different.Please follow up.

## 2018-08-11 ENCOUNTER — Encounter (HOSPITAL_COMMUNITY): Admission: RE | Admit: 2018-08-11 | Payer: Medicare Other | Source: Ambulatory Visit

## 2018-08-14 ENCOUNTER — Telehealth: Payer: Self-pay | Admitting: Family Medicine

## 2018-08-14 MED FILL — ACETAMINOPHEN/COD #3 TABLET: 300-30 | 7 days supply | Qty: 14 | Fill #2

## 2018-08-14 NOTE — Telephone Encounter (Signed)
Call was placed to patient and a voicemail was left informing patient to return phone call.

## 2018-08-14 NOTE — Telephone Encounter (Signed)
Patient would like call call back from nurse

## 2018-08-15 ENCOUNTER — Other Ambulatory Visit: Payer: Self-pay | Admitting: Physical Medicine & Rehabilitation

## 2018-08-15 ENCOUNTER — Ambulatory Visit: Payer: Medicaid Other | Admitting: Internal Medicine

## 2018-08-15 ENCOUNTER — Encounter

## 2018-08-17 ENCOUNTER — Encounter: Payer: Self-pay | Admitting: Pulmonary Disease

## 2018-08-17 ENCOUNTER — Ambulatory Visit (INDEPENDENT_AMBULATORY_CARE_PROVIDER_SITE_OTHER): Payer: Medicare Other | Admitting: Pulmonary Disease

## 2018-08-17 ENCOUNTER — Telehealth: Payer: Self-pay | Admitting: Pulmonary Disease

## 2018-08-17 ENCOUNTER — Other Ambulatory Visit (INDEPENDENT_AMBULATORY_CARE_PROVIDER_SITE_OTHER): Payer: Medicaid Other

## 2018-08-17 VITALS — BP 118/70 | HR 67 | Ht 69.0 in | Wt 244.8 lb

## 2018-08-17 DIAGNOSIS — J449 Chronic obstructive pulmonary disease, unspecified: Secondary | ICD-10-CM

## 2018-08-17 DIAGNOSIS — Z87891 Personal history of nicotine dependence: Secondary | ICD-10-CM | POA: Diagnosis not present

## 2018-08-17 DIAGNOSIS — J9859 Other diseases of mediastinum, not elsewhere classified: Secondary | ICD-10-CM

## 2018-08-17 DIAGNOSIS — R590 Localized enlarged lymph nodes: Secondary | ICD-10-CM

## 2018-08-17 LAB — CBC WITH DIFFERENTIAL/PLATELET
Basophils Absolute: 0 10*3/uL (ref 0.0–0.1)
Basophils Relative: 0.3 % (ref 0.0–3.0)
Eosinophils Absolute: 0.1 10*3/uL (ref 0.0–0.7)
Eosinophils Relative: 1 % (ref 0.0–5.0)
HCT: 41.4 % (ref 39.0–52.0)
Hemoglobin: 13.8 g/dL (ref 13.0–17.0)
LYMPHS ABS: 2.8 10*3/uL (ref 0.7–4.0)
Lymphocytes Relative: 38.2 % (ref 12.0–46.0)
MCHC: 33.3 g/dL (ref 30.0–36.0)
MCV: 80.6 fl (ref 78.0–100.0)
MONOS PCT: 10.8 % (ref 3.0–12.0)
Monocytes Absolute: 0.8 10*3/uL (ref 0.1–1.0)
NEUTROS ABS: 3.6 10*3/uL (ref 1.4–7.7)
NEUTROS PCT: 49.7 % (ref 43.0–77.0)
PLATELETS: 220 10*3/uL (ref 150.0–400.0)
RBC: 5.13 Mil/uL (ref 4.22–5.81)
RDW: 14.5 % (ref 11.5–15.5)
WBC: 7.3 10*3/uL (ref 4.0–10.5)

## 2018-08-17 LAB — PROTIME-INR
INR: 1.1 ratio — ABNORMAL HIGH (ref 0.8–1.0)
Prothrombin Time: 12.5 s (ref 9.6–13.1)

## 2018-08-17 LAB — BASIC METABOLIC PANEL
BUN: 12 mg/dL (ref 6–23)
CO2: 31 meq/L (ref 19–32)
Calcium: 8.9 mg/dL (ref 8.4–10.5)
Chloride: 101 mEq/L (ref 96–112)
Creatinine, Ser: 0.96 mg/dL (ref 0.40–1.50)
GFR: 105.78 mL/min (ref >60.00)
GLUCOSE: 81 mg/dL (ref 70–99)
POTASSIUM: 3.7 meq/L (ref 3.5–5.1)
SODIUM: 139 meq/L (ref 135–145)

## 2018-08-17 LAB — APTT: APTT: 33.6 s — AB (ref 23.4–32.7)

## 2018-08-17 MED ORDER — ALBUTEROL SULFATE 108 (90 BASE) MCG/ACT IN AEPB: 1.0000 | INHALATION_SPRAY | Freq: Four times a day (QID) | RESPIRATORY_TRACT | 3 refills | Status: DC | PRN

## 2018-08-17 NOTE — Telephone Encounter (Signed)
PCCM:  I spoke with Dr. Debara Pickett the patients cardiologist and is understanding of the need for stopping plavix 5 days prior to his EBUS bronchoscopy.   We will plan to keep Dr. Debara Pickett informed of the results.   Please call Andrew West and have him stop his plavix 5 days before the procedure. Therefore, would like to have the patient scheduled for Thursday or Friday of next week at [facility].   Thanks  Garner Nash, DO [facility] 08/17/2018 1:05 PM  Personal pager: [phone] If unanswered, please page CCM On-call: [phone]

## 2018-08-17 NOTE — Progress Notes (Signed)
Synopsis: Referred in September 2019 for lung mass by [name], MD  Subjective:   PATIENT ID: Andrew West GENDER: male DOB: 1965-12-04, MRN: [id number]  Chief Complaint  Patient presents with  . Consult    states he was having SOB, pcp obtained imaging and was dx with mass on lung. states he has alot of pain on right side. Reports he has been having SOB with dry cough for several months. states its painful to inhale and exhale.     Patient has a past medical history of significant for coronary disease with prior PCI.  Currently on aspirin and Plavix.  Followed by cardiology.  He is a former smoker, quit about 2.5 years ago, smoked for 35 + years.  Since today with history of progressive shortness of breath and left-sided intermittent chest pains.  He was recently evaluated in the emergency room for chest pain which led to chest imaging which revealed a 3 cm paratracheal mass as well as enlarged mediastinal and hilar adenopathy.  Patient referred to pulmonary for recommendations and tissue sampling and biopsy.  She currently denies fever, night sweats, weight loss, nausea vomiting.   Patient is having significant anxiety and depression related to the recent diagnosis of this mass.  He is very concerned.  Family history of lung cancer, gastric cancer, colon cancer. No history sarcoidosis. Currently on disability and [employer] for 30+ years.    Past Medical History:  Diagnosis Date  . Anxiety   . CAD (coronary artery disease)    a. s/p multiple PCIs with last cath 11/2016 with severe multivessel CAD, s/p PCTA to LCx but unable to pass stent  . Chronic leg pain    bilateral  . Chronic lower back pain   . COPD (chronic obstructive pulmonary disease) ([place])   . Depression   . GERD (gastroesophageal reflux disease)    Takes Dexilant  . HLD (hyperlipidemia)   . Hypertension   . Rhabdomyolysis    h/o, r/t statins  . Sleep apnea    "can't tolerate mask" (12/16/2016)    . Type II diabetes mellitus (HCC)      Family History  Problem Relation Age of Onset  . Heart attack Father   . Hypertension Mother   . Diabetes Mother   . Heart disease Brother        x 3   . Heart attack Brother        deceased  . Hypertension Sister   . Diabetes Sister   . Anesthesia problems Neg Hx   . Hypotension Neg Hx   . Malignant hyperthermia Neg Hx   . Pseudochol deficiency Neg Hx      Past Surgical History:  Procedure Laterality Date  . BACK SURGERY    . CARDIAC CATHETERIZATION N/A 09/25/2015   Procedure: Left Heart Cath and Coronary Angiography;  Surgeon: Leonie Man, MD;  Location: [facility];  Service: Cardiovascular;  Laterality: N/A;  . CARDIAC CATHETERIZATION N/A 12/16/2016   Procedure: Left Heart Cath and Coronary Angiography;  Surgeon: Leonie Man, MD;  Location: [facility];  Service: Cardiovascular;  Laterality: N/A;  . CARDIAC CATHETERIZATION N/A 12/16/2016   Procedure: Coronary Balloon Angioplasty;  Surgeon: Leonie Man, MD;  Location: [facility];  Service: Cardiovascular;  Laterality: N/A;  . COLONOSCOPY W/ POLYPECTOMY    . CORONARY ANGIOPLASTY  09/25/2015   mid cir & om  . CORONARY ANGIOPLASTY WITH STENT PLACEMENT  10/09/2001  Synopsis: Referred in September 2019 for lung mass by [name], MD  Subjective:   PATIENT ID: Andrew West GENDER: male DOB: 1965-12-04, MRN: [id number]  Chief Complaint  Patient presents with  . Consult    states he was having SOB, pcp obtained imaging and was dx with mass on lung. states he has alot of pain on right side. Reports he has been having SOB with dry cough for several months. states its painful to inhale and exhale.     Patient has a past medical history of significant for coronary disease with prior PCI.  Currently on aspirin and Plavix.  Followed by cardiology.  He is a former smoker, quit about 2.5 years ago, smoked for 35 + years.  Since today with history of progressive shortness of breath and left-sided intermittent chest pains.  He was recently evaluated in the emergency room for chest pain which led to chest imaging which revealed a 3 cm paratracheal mass as well as enlarged mediastinal and hilar adenopathy.  Patient referred to pulmonary for recommendations and tissue sampling and biopsy.  She currently denies fever, night sweats, weight loss, nausea vomiting.   Patient is having significant anxiety and depression related to the recent diagnosis of this mass.  He is very concerned.  Family history of lung cancer, gastric cancer, colon cancer. No history sarcoidosis. Currently on disability and [employer] for 30+ years.    Past Medical History:  Diagnosis Date  . Anxiety   . CAD (coronary artery disease)    a. s/p multiple PCIs with last cath 11/2016 with severe multivessel CAD, s/p PCTA to LCx but unable to pass stent  . Chronic leg pain    bilateral  . Chronic lower back pain   . COPD (chronic obstructive pulmonary disease) ([place])   . Depression   . GERD (gastroesophageal reflux disease)    Takes Dexilant  . HLD (hyperlipidemia)   . Hypertension   . Rhabdomyolysis    h/o, r/t statins  . Sleep apnea    "can't tolerate mask" (12/16/2016)    . Type II diabetes mellitus (HCC)      Family History  Problem Relation Age of Onset  . Heart attack Father   . Hypertension Mother   . Diabetes Mother   . Heart disease Brother        x 3   . Heart attack Brother        deceased  . Hypertension Sister   . Diabetes Sister   . Anesthesia problems Neg Hx   . Hypotension Neg Hx   . Malignant hyperthermia Neg Hx   . Pseudochol deficiency Neg Hx      Past Surgical History:  Procedure Laterality Date  . BACK SURGERY    . CARDIAC CATHETERIZATION N/A 09/25/2015   Procedure: Left Heart Cath and Coronary Angiography;  Surgeon: Leonie Man, MD;  Location: [facility];  Service: Cardiovascular;  Laterality: N/A;  . CARDIAC CATHETERIZATION N/A 12/16/2016   Procedure: Left Heart Cath and Coronary Angiography;  Surgeon: Leonie Man, MD;  Location: [facility];  Service: Cardiovascular;  Laterality: N/A;  . CARDIAC CATHETERIZATION N/A 12/16/2016   Procedure: Coronary Balloon Angioplasty;  Surgeon: Leonie Man, MD;  Location: [facility];  Service: Cardiovascular;  Laterality: N/A;  . COLONOSCOPY W/ POLYPECTOMY    . CORONARY ANGIOPLASTY  09/25/2015   mid cir & om  . CORONARY ANGIOPLASTY WITH STENT PLACEMENT  10/09/2001  Synopsis: Referred in September 2019 for lung mass by [name], MD  Subjective:   PATIENT ID: Andrew West GENDER: male DOB: 1965-12-04, MRN: [id number]  Chief Complaint  Patient presents with  . Consult    states he was having SOB, pcp obtained imaging and was dx with mass on lung. states he has alot of pain on right side. Reports he has been having SOB with dry cough for several months. states its painful to inhale and exhale.     Patient has a past medical history of significant for coronary disease with prior PCI.  Currently on aspirin and Plavix.  Followed by cardiology.  He is a former smoker, quit about 2.5 years ago, smoked for 35 + years.  Since today with history of progressive shortness of breath and left-sided intermittent chest pains.  He was recently evaluated in the emergency room for chest pain which led to chest imaging which revealed a 3 cm paratracheal mass as well as enlarged mediastinal and hilar adenopathy.  Patient referred to pulmonary for recommendations and tissue sampling and biopsy.  She currently denies fever, night sweats, weight loss, nausea vomiting.   Patient is having significant anxiety and depression related to the recent diagnosis of this mass.  He is very concerned.  Family history of lung cancer, gastric cancer, colon cancer. No history sarcoidosis. Currently on disability and [employer] for 30+ years.    Past Medical History:  Diagnosis Date  . Anxiety   . CAD (coronary artery disease)    a. s/p multiple PCIs with last cath 11/2016 with severe multivessel CAD, s/p PCTA to LCx but unable to pass stent  . Chronic leg pain    bilateral  . Chronic lower back pain   . COPD (chronic obstructive pulmonary disease) ([place])   . Depression   . GERD (gastroesophageal reflux disease)    Takes Dexilant  . HLD (hyperlipidemia)   . Hypertension   . Rhabdomyolysis    h/o, r/t statins  . Sleep apnea    "can't tolerate mask" (12/16/2016)    . Type II diabetes mellitus (HCC)      Family History  Problem Relation Age of Onset  . Heart attack Father   . Hypertension Mother   . Diabetes Mother   . Heart disease Brother        x 3   . Heart attack Brother        deceased  . Hypertension Sister   . Diabetes Sister   . Anesthesia problems Neg Hx   . Hypotension Neg Hx   . Malignant hyperthermia Neg Hx   . Pseudochol deficiency Neg Hx      Past Surgical History:  Procedure Laterality Date  . BACK SURGERY    . CARDIAC CATHETERIZATION N/A 09/25/2015   Procedure: Left Heart Cath and Coronary Angiography;  Surgeon: Leonie Man, MD;  Location: [facility];  Service: Cardiovascular;  Laterality: N/A;  . CARDIAC CATHETERIZATION N/A 12/16/2016   Procedure: Left Heart Cath and Coronary Angiography;  Surgeon: Leonie Man, MD;  Location: [facility];  Service: Cardiovascular;  Laterality: N/A;  . CARDIAC CATHETERIZATION N/A 12/16/2016   Procedure: Coronary Balloon Angioplasty;  Surgeon: Leonie Man, MD;  Location: [facility];  Service: Cardiovascular;  Laterality: N/A;  . COLONOSCOPY W/ POLYPECTOMY    . CORONARY ANGIOPLASTY  09/25/2015   mid cir & om  . CORONARY ANGIOPLASTY WITH STENT PLACEMENT  10/09/2001  Synopsis: Referred in September 2019 for lung mass by [name], MD  Subjective:   PATIENT ID: Andrew West GENDER: male DOB: 1965-12-04, MRN: [id number]  Chief Complaint  Patient presents with  . Consult    states he was having SOB, pcp obtained imaging and was dx with mass on lung. states he has alot of pain on right side. Reports he has been having SOB with dry cough for several months. states its painful to inhale and exhale.     Patient has a past medical history of significant for coronary disease with prior PCI.  Currently on aspirin and Plavix.  Followed by cardiology.  He is a former smoker, quit about 2.5 years ago, smoked for 35 + years.  Since today with history of progressive shortness of breath and left-sided intermittent chest pains.  He was recently evaluated in the emergency room for chest pain which led to chest imaging which revealed a 3 cm paratracheal mass as well as enlarged mediastinal and hilar adenopathy.  Patient referred to pulmonary for recommendations and tissue sampling and biopsy.  She currently denies fever, night sweats, weight loss, nausea vomiting.   Patient is having significant anxiety and depression related to the recent diagnosis of this mass.  He is very concerned.  Family history of lung cancer, gastric cancer, colon cancer. No history sarcoidosis. Currently on disability and [employer] for 30+ years.    Past Medical History:  Diagnosis Date  . Anxiety   . CAD (coronary artery disease)    a. s/p multiple PCIs with last cath 11/2016 with severe multivessel CAD, s/p PCTA to LCx but unable to pass stent  . Chronic leg pain    bilateral  . Chronic lower back pain   . COPD (chronic obstructive pulmonary disease) ([place])   . Depression   . GERD (gastroesophageal reflux disease)    Takes Dexilant  . HLD (hyperlipidemia)   . Hypertension   . Rhabdomyolysis    h/o, r/t statins  . Sleep apnea    "can't tolerate mask" (12/16/2016)    . Type II diabetes mellitus (HCC)      Family History  Problem Relation Age of Onset  . Heart attack Father   . Hypertension Mother   . Diabetes Mother   . Heart disease Brother        x 3   . Heart attack Brother        deceased  . Hypertension Sister   . Diabetes Sister   . Anesthesia problems Neg Hx   . Hypotension Neg Hx   . Malignant hyperthermia Neg Hx   . Pseudochol deficiency Neg Hx      Past Surgical History:  Procedure Laterality Date  . BACK SURGERY    . CARDIAC CATHETERIZATION N/A 09/25/2015   Procedure: Left Heart Cath and Coronary Angiography;  Surgeon: Leonie Man, MD;  Location: [facility];  Service: Cardiovascular;  Laterality: N/A;  . CARDIAC CATHETERIZATION N/A 12/16/2016   Procedure: Left Heart Cath and Coronary Angiography;  Surgeon: Leonie Man, MD;  Location: [facility];  Service: Cardiovascular;  Laterality: N/A;  . CARDIAC CATHETERIZATION N/A 12/16/2016   Procedure: Coronary Balloon Angioplasty;  Surgeon: Leonie Man, MD;  Location: [facility];  Service: Cardiovascular;  Laterality: N/A;  . COLONOSCOPY W/ POLYPECTOMY    . CORONARY ANGIOPLASTY  09/25/2015   mid cir & om  . CORONARY ANGIOPLASTY WITH STENT PLACEMENT  10/09/2001  Synopsis: Referred in September 2019 for lung mass by [name], MD  Subjective:   PATIENT ID: Andrew West GENDER: male DOB: 29-Nov-1965, MRN: [id number]  Chief Complaint  Patient presents with  . Consult    states he was having SOB, pcp obtained imaging and was dx with mass on lung. states he has alot of pain on right side. Reports he has been having SOB with dry cough for several months. states its painful to inhale and exhale.     Patient has a past medical history of significant for coronary disease with prior PCI.  Currently on aspirin and Plavix.  Followed by cardiology.  He is a former smoker, quit about 2.5 years ago, smoked for 35 + years.  Since today with history of progressive shortness of breath and left-sided intermittent chest pains.  He was recently evaluated in the emergency room for chest pain which led to chest imaging which revealed a 3 cm paratracheal mass as well as enlarged mediastinal and hilar adenopathy.  Patient referred to pulmonary for recommendations and tissue sampling and biopsy.  She currently denies fever, night sweats, weight loss, nausea vomiting.   Patient is having significant anxiety and depression related to the recent diagnosis of this mass.  He is very concerned.  Family history of lung cancer, gastric cancer, colon cancer. No history sarcoidosis. Currently on disability and [employer] for 30+ years.    Past Medical History:  Diagnosis Date  . Anxiety   . CAD (coronary artery disease)    a. s/p multiple PCIs with last cath 11/2016 with severe multivessel CAD, s/p PCTA to LCx but unable to pass stent  . Chronic leg pain    bilateral  . Chronic lower back pain   . COPD (chronic obstructive pulmonary disease) ([place])   . Depression   . GERD (gastroesophageal reflux disease)    Takes Dexilant  . HLD (hyperlipidemia)   . Hypertension   . Rhabdomyolysis    h/o, r/t statins  . Sleep apnea    "can't tolerate mask" (12/16/2016)    . Type II diabetes mellitus (HCC)      Family History  Problem Relation Age of Onset  . Heart attack Father   . Hypertension Mother   . Diabetes Mother   . Heart disease Brother        x 3   . Heart attack Brother        deceased  . Hypertension Sister   . Diabetes Sister   . Anesthesia problems Neg Hx   . Hypotension Neg Hx   . Malignant hyperthermia Neg Hx   . Pseudochol deficiency Neg Hx      Past Surgical History:  Procedure Laterality Date  . BACK SURGERY    . CARDIAC CATHETERIZATION N/A 09/25/2015   Procedure: Left Heart Cath and Coronary Angiography;  Surgeon: Leonie Man, MD;  Location: [facility];  Service: Cardiovascular;  Laterality: N/A;  . CARDIAC CATHETERIZATION N/A 12/16/2016   Procedure: Left Heart Cath and Coronary Angiography;  Surgeon: Leonie Man, MD;  Location: [facility];  Service: Cardiovascular;  Laterality: N/A;  . CARDIAC CATHETERIZATION N/A 12/16/2016   Procedure: Coronary Balloon Angioplasty;  Surgeon: Leonie Man, MD;  Location: [facility];  Service: Cardiovascular;  Laterality: N/A;  . COLONOSCOPY W/ POLYPECTOMY    . CORONARY ANGIOPLASTY  09/25/2015   mid cir & om  . CORONARY ANGIOPLASTY WITH STENT PLACEMENT  10/09/2001  Synopsis: Referred in September 2019 for lung mass by [name], MD  Subjective:   PATIENT ID: Andrew West GENDER: male DOB: 29-Nov-1965, MRN: [id number]  Chief Complaint  Patient presents with  . Consult    states he was having SOB, pcp obtained imaging and was dx with mass on lung. states he has alot of pain on right side. Reports he has been having SOB with dry cough for several months. states its painful to inhale and exhale.     Patient has a past medical history of significant for coronary disease with prior PCI.  Currently on aspirin and Plavix.  Followed by cardiology.  He is a former smoker, quit about 2.5 years ago, smoked for 35 + years.  Since today with history of progressive shortness of breath and left-sided intermittent chest pains.  He was recently evaluated in the emergency room for chest pain which led to chest imaging which revealed a 3 cm paratracheal mass as well as enlarged mediastinal and hilar adenopathy.  Patient referred to pulmonary for recommendations and tissue sampling and biopsy.  She currently denies fever, night sweats, weight loss, nausea vomiting.   Patient is having significant anxiety and depression related to the recent diagnosis of this mass.  He is very concerned.  Family history of lung cancer, gastric cancer, colon cancer. No history sarcoidosis. Currently on disability and [employer] for 30+ years.    Past Medical History:  Diagnosis Date  . Anxiety   . CAD (coronary artery disease)    a. s/p multiple PCIs with last cath 11/2016 with severe multivessel CAD, s/p PCTA to LCx but unable to pass stent  . Chronic leg pain    bilateral  . Chronic lower back pain   . COPD (chronic obstructive pulmonary disease) ([place])   . Depression   . GERD (gastroesophageal reflux disease)    Takes Dexilant  . HLD (hyperlipidemia)   . Hypertension   . Rhabdomyolysis    h/o, r/t statins  . Sleep apnea    "can't tolerate mask" (12/16/2016)    . Type II diabetes mellitus (HCC)      Family History  Problem Relation Age of Onset  . Heart attack Father   . Hypertension Mother   . Diabetes Mother   . Heart disease Brother        x 3   . Heart attack Brother        deceased  . Hypertension Sister   . Diabetes Sister   . Anesthesia problems Neg Hx   . Hypotension Neg Hx   . Malignant hyperthermia Neg Hx   . Pseudochol deficiency Neg Hx      Past Surgical History:  Procedure Laterality Date  . BACK SURGERY    . CARDIAC CATHETERIZATION N/A 09/25/2015   Procedure: Left Heart Cath and Coronary Angiography;  Surgeon: Leonie Man, MD;  Location: [facility];  Service: Cardiovascular;  Laterality: N/A;  . CARDIAC CATHETERIZATION N/A 12/16/2016   Procedure: Left Heart Cath and Coronary Angiography;  Surgeon: Leonie Man, MD;  Location: [facility];  Service: Cardiovascular;  Laterality: N/A;  . CARDIAC CATHETERIZATION N/A 12/16/2016   Procedure: Coronary Balloon Angioplasty;  Surgeon: Leonie Man, MD;  Location: [facility];  Service: Cardiovascular;  Laterality: N/A;  . COLONOSCOPY W/ POLYPECTOMY    . CORONARY ANGIOPLASTY  09/25/2015   mid cir & om  . CORONARY ANGIOPLASTY WITH STENT PLACEMENT  10/09/2001  Synopsis: Referred in September 2019 for lung mass by [name], MD  Subjective:   PATIENT ID: Andrew West GENDER: male DOB: 29-Nov-1965, MRN: [id number]  Chief Complaint  Patient presents with  . Consult    states he was having SOB, pcp obtained imaging and was dx with mass on lung. states he has alot of pain on right side. Reports he has been having SOB with dry cough for several months. states its painful to inhale and exhale.     Patient has a past medical history of significant for coronary disease with prior PCI.  Currently on aspirin and Plavix.  Followed by cardiology.  He is a former smoker, quit about 2.5 years ago, smoked for 35 + years.  Since today with history of progressive shortness of breath and left-sided intermittent chest pains.  He was recently evaluated in the emergency room for chest pain which led to chest imaging which revealed a 3 cm paratracheal mass as well as enlarged mediastinal and hilar adenopathy.  Patient referred to pulmonary for recommendations and tissue sampling and biopsy.  She currently denies fever, night sweats, weight loss, nausea vomiting.   Patient is having significant anxiety and depression related to the recent diagnosis of this mass.  He is very concerned.  Family history of lung cancer, gastric cancer, colon cancer. No history sarcoidosis. Currently on disability and [employer] for 30+ years.    Past Medical History:  Diagnosis Date  . Anxiety   . CAD (coronary artery disease)    a. s/p multiple PCIs with last cath 11/2016 with severe multivessel CAD, s/p PCTA to LCx but unable to pass stent  . Chronic leg pain    bilateral  . Chronic lower back pain   . COPD (chronic obstructive pulmonary disease) ([place])   . Depression   . GERD (gastroesophageal reflux disease)    Takes Dexilant  . HLD (hyperlipidemia)   . Hypertension   . Rhabdomyolysis    h/o, r/t statins  . Sleep apnea    "can't tolerate mask" (12/16/2016)    . Type II diabetes mellitus (HCC)      Family History  Problem Relation Age of Onset  . Heart attack Father   . Hypertension Mother   . Diabetes Mother   . Heart disease Brother        x 3   . Heart attack Brother        deceased  . Hypertension Sister   . Diabetes Sister   . Anesthesia problems Neg Hx   . Hypotension Neg Hx   . Malignant hyperthermia Neg Hx   . Pseudochol deficiency Neg Hx      Past Surgical History:  Procedure Laterality Date  . BACK SURGERY    . CARDIAC CATHETERIZATION N/A 09/25/2015   Procedure: Left Heart Cath and Coronary Angiography;  Surgeon: Leonie Man, MD;  Location: [facility];  Service: Cardiovascular;  Laterality: N/A;  . CARDIAC CATHETERIZATION N/A 12/16/2016   Procedure: Left Heart Cath and Coronary Angiography;  Surgeon: Leonie Man, MD;  Location: [facility];  Service: Cardiovascular;  Laterality: N/A;  . CARDIAC CATHETERIZATION N/A 12/16/2016   Procedure: Coronary Balloon Angioplasty;  Surgeon: Leonie Man, MD;  Location: [facility];  Service: Cardiovascular;  Laterality: N/A;  . COLONOSCOPY W/ POLYPECTOMY    . CORONARY ANGIOPLASTY  09/25/2015   mid cir & om  . CORONARY ANGIOPLASTY WITH STENT PLACEMENT  10/09/2001

## 2018-08-17 NOTE — Patient Instructions (Addendum)
We will schedule you for your outpatient bronchoscopy.  We will call you when we have clearance to stop your plavix.  We will get pre-op labs today. We will schedule you next week for your procedure or ASAP.

## 2018-08-17 NOTE — H&P (View-Only) (Signed)
 Synopsis: Referred in September 2019 for lung mass by Newlin, Enobong, MD  Subjective:   PATIENT ID: Andrew West, Andrew West  Chief Complaint  Patient presents with  . Consult    states he was having SOB, pcp obtained imaging and was dx with mass on lung. states he has alot of pain on right side. Reports he has been having SOB with dry cough for several months. states its painful to inhale and exhale.     Patient has a past medical history of significant for coronary disease with prior PCI.  Currently on aspirin and Plavix.  Followed by cardiology.  He is a former smoker, quit about 2.5 years ago, smoked for 35 + years.  Since today with history of progressive shortness of breath and left-sided intermittent chest pains.  He was recently evaluated in the emergency room for chest pain which led to chest imaging which revealed a 3 cm paratracheal mass as well as enlarged mediastinal and hilar adenopathy.  Patient referred to pulmonary for recommendations and tissue sampling and biopsy.  She currently denies fever, night sweats, weight loss, nausea vomiting.   Patient is having significant anxiety and depression related to the recent diagnosis of this mass.  He is very concerned.  Family history of lung cancer, gastric cancer, colon cancer. No history sarcoidosis. Currently on disability and [employer] for 30+ years.    Past Medical History:  Diagnosis Date  . Anxiety   . CAD (coronary artery disease)    a. s/p multiple PCIs with last cath 11/2016 with severe multivessel CAD, s/p PCTA to LCx but unable to pass stent  . Chronic leg pain    bilateral  . Chronic lower back pain   . COPD (chronic obstructive pulmonary disease) (HCC)   . Depression   . GERD (gastroesophageal reflux disease)    Takes Dexilant  . HLD (hyperlipidemia)   . Hypertension   . Rhabdomyolysis    h/o, r/t statins  . Sleep apnea    "can't tolerate mask" (12/16/2016)    . Type II diabetes mellitus (HCC)      Family History  Problem Relation Age of Onset  . Heart attack Father   . Hypertension Mother   . Diabetes Mother   . Heart disease Brother        x 3   . Heart attack Brother        deceased  . Hypertension Sister   . Diabetes Sister   . Anesthesia problems Neg Hx   . Hypotension Neg Hx   . Malignant hyperthermia Neg Hx   . Pseudochol deficiency Neg Hx      Past Surgical History:  Procedure Laterality Date  . BACK SURGERY    . CARDIAC CATHETERIZATION N/A 09/25/2015   Procedure: Left Heart Cath and Coronary Angiography;  Surgeon: David W Harding, MD;  Location: [facility];  Service: Cardiovascular;  Laterality: N/A;  . CARDIAC CATHETERIZATION N/A 12/16/2016   Procedure: Left Heart Cath and Coronary Angiography;  Surgeon: David W Harding, MD;  Location: [facility];  Service: Cardiovascular;  Laterality: N/A;  . CARDIAC CATHETERIZATION N/A 12/16/2016   Procedure: Coronary Balloon Angioplasty;  Surgeon: David W Harding, MD;  Location: [facility];  Service: Cardiovascular;  Laterality: N/A;  . COLONOSCOPY W/ POLYPECTOMY    . CORONARY ANGIOPLASTY  09/25/2015   mid cir & om  . CORONARY ANGIOPLASTY WITH STENT PLACEMENT  10/09/2001      Synopsis: Referred in September 2019 for lung mass by Newlin, Enobong, MD  Subjective:   PATIENT ID: Andrew West, Andrew West  Chief Complaint  Patient presents with  . Consult    states he was having SOB, pcp obtained imaging and was dx with mass on lung. states he has alot of pain on right side. Reports he has been having SOB with dry cough for several months. states its painful to inhale and exhale.     Patient has a past medical history of significant for coronary disease with prior PCI.  Currently on aspirin and Plavix.  Followed by cardiology.  He is a former smoker, quit about 2.5 years ago, smoked for 35 + years.  Since today with history of progressive shortness of breath and left-sided intermittent chest pains.  He was recently evaluated in the emergency room for chest pain which led to chest imaging which revealed a 3 cm paratracheal mass as well as enlarged mediastinal and hilar adenopathy.  Patient referred to pulmonary for recommendations and tissue sampling and biopsy.  She currently denies fever, night sweats, weight loss, nausea vomiting.   Patient is having significant anxiety and depression related to the recent diagnosis of this mass.  He is very concerned.  Family history of lung cancer, gastric cancer, colon cancer. No history sarcoidosis. Currently on disability and [employer] for 30+ years.    Past Medical History:  Diagnosis Date  . Anxiety   . CAD (coronary artery disease)    a. s/p multiple PCIs with last cath 11/2016 with severe multivessel CAD, s/p PCTA to LCx but unable to pass stent  . Chronic leg pain    bilateral  . Chronic lower back pain   . COPD (chronic obstructive pulmonary disease) (HCC)   . Depression   . GERD (gastroesophageal reflux disease)    Takes Dexilant  . HLD (hyperlipidemia)   . Hypertension   . Rhabdomyolysis    h/o, r/t statins  . Sleep apnea    "can't tolerate mask" (12/16/2016)    . Type II diabetes mellitus (HCC)      Family History  Problem Relation Age of Onset  . Heart attack Father   . Hypertension Mother   . Diabetes Mother   . Heart disease Brother        x 3   . Heart attack Brother        deceased  . Hypertension Sister   . Diabetes Sister   . Anesthesia problems Neg Hx   . Hypotension Neg Hx   . Malignant hyperthermia Neg Hx   . Pseudochol deficiency Neg Hx      Past Surgical History:  Procedure Laterality Date  . BACK SURGERY    . CARDIAC CATHETERIZATION N/A 09/25/2015   Procedure: Left Heart Cath and Coronary Angiography;  Surgeon: David W Harding, MD;  Location: [facility];  Service: Cardiovascular;  Laterality: N/A;  . CARDIAC CATHETERIZATION N/A 12/16/2016   Procedure: Left Heart Cath and Coronary Angiography;  Surgeon: David W Harding, MD;  Location: [facility];  Service: Cardiovascular;  Laterality: N/A;  . CARDIAC CATHETERIZATION N/A 12/16/2016   Procedure: Coronary Balloon Angioplasty;  Surgeon: David W Harding, MD;  Location: [facility];  Service: Cardiovascular;  Laterality: N/A;  . COLONOSCOPY W/ POLYPECTOMY    . CORONARY ANGIOPLASTY  09/25/2015   mid cir & om  . CORONARY ANGIOPLASTY WITH STENT PLACEMENT  10/09/2001      Synopsis: Referred in September 2019 for lung mass by Newlin, Enobong, MD  Subjective:   PATIENT ID: Andrew West, Andrew West  Chief Complaint  Patient presents with  . Consult    states he was having SOB, pcp obtained imaging and was dx with mass on lung. states he has alot of pain on right side. Reports he has been having SOB with dry cough for several months. states its painful to inhale and exhale.     Patient has a past medical history of significant for coronary disease with prior PCI.  Currently on aspirin and Plavix.  Followed by cardiology.  He is a former smoker, quit about 2.5 years ago, smoked for 35 + years.  Since today with history of progressive shortness of breath and left-sided intermittent chest pains.  He was recently evaluated in the emergency room for chest pain which led to chest imaging which revealed a 3 cm paratracheal mass as well as enlarged mediastinal and hilar adenopathy.  Patient referred to pulmonary for recommendations and tissue sampling and biopsy.  She currently denies fever, night sweats, weight loss, nausea vomiting.   Patient is having significant anxiety and depression related to the recent diagnosis of this mass.  He is very concerned.  Family history of lung cancer, gastric cancer, colon cancer. No history sarcoidosis. Currently on disability and [employer] for 30+ years.    Past Medical History:  Diagnosis Date  . Anxiety   . CAD (coronary artery disease)    a. s/p multiple PCIs with last cath 11/2016 with severe multivessel CAD, s/p PCTA to LCx but unable to pass stent  . Chronic leg pain    bilateral  . Chronic lower back pain   . COPD (chronic obstructive pulmonary disease) (HCC)   . Depression   . GERD (gastroesophageal reflux disease)    Takes Dexilant  . HLD (hyperlipidemia)   . Hypertension   . Rhabdomyolysis    h/o, r/t statins  . Sleep apnea    "can't tolerate mask" (12/16/2016)    . Type II diabetes mellitus (HCC)      Family History  Problem Relation Age of Onset  . Heart attack Father   . Hypertension Mother   . Diabetes Mother   . Heart disease Brother        x 3   . Heart attack Brother        deceased  . Hypertension Sister   . Diabetes Sister   . Anesthesia problems Neg Hx   . Hypotension Neg Hx   . Malignant hyperthermia Neg Hx   . Pseudochol deficiency Neg Hx      Past Surgical History:  Procedure Laterality Date  . BACK SURGERY    . CARDIAC CATHETERIZATION N/A 09/25/2015   Procedure: Left Heart Cath and Coronary Angiography;  Surgeon: David W Harding, MD;  Location: [facility];  Service: Cardiovascular;  Laterality: N/A;  . CARDIAC CATHETERIZATION N/A 12/16/2016   Procedure: Left Heart Cath and Coronary Angiography;  Surgeon: David W Harding, MD;  Location: [facility];  Service: Cardiovascular;  Laterality: N/A;  . CARDIAC CATHETERIZATION N/A 12/16/2016   Procedure: Coronary Balloon Angioplasty;  Surgeon: David W Harding, MD;  [facility]: [facility];  Service: Cardiovascular;  Laterality: N/A;  . COLONOSCOPY W/ POLYPECTOMY    . CORONARY ANGIOPLASTY  09/25/2015   mid cir & om  . CORONARY ANGIOPLASTY WITH STENT PLACEMENT  10/09/2001      Synopsis: Referred in September 2019 for lung mass by Newlin, Enobong, MD  Subjective:   PATIENT ID: Andrew West, Andrew West  Chief Complaint  Patient presents with  . Consult    states he was having SOB, pcp obtained imaging and was dx with mass on lung. states he has alot of pain on right side. Reports he has been having SOB with dry cough for several months. states its painful to inhale and exhale.     Patient has a past medical history of significant for coronary disease with prior PCI.  Currently on aspirin and Plavix.  Followed by cardiology.  He is a former smoker, quit about 2.5 years ago, smoked for 35 + years.  Since today with history of progressive shortness of breath and left-sided intermittent chest pains.  He was recently evaluated in the emergency room for chest pain which led to chest imaging which revealed a 3 cm paratracheal mass as well as enlarged mediastinal and hilar adenopathy.  Patient referred to pulmonary for recommendations and tissue sampling and biopsy.  She currently denies fever, night sweats, weight loss, nausea vomiting.   Patient is having significant anxiety and depression related to the recent diagnosis of this mass.  He is very concerned.  Family history of lung cancer, gastric cancer, colon cancer. No history sarcoidosis. Currently on disability and [employer] for 30+ years.    Past Medical History:  Diagnosis Date  . Anxiety   . CAD (coronary artery disease)    a. s/p multiple PCIs with last cath 11/2016 with severe multivessel CAD, s/p PCTA to LCx but unable to pass stent  . Chronic leg pain    bilateral  . Chronic lower back pain   . COPD (chronic obstructive pulmonary disease) (HCC)   . Depression   . GERD (gastroesophageal reflux disease)    Takes Dexilant  . HLD (hyperlipidemia)   . Hypertension   . Rhabdomyolysis    h/o, r/t statins  . Sleep apnea    "can't tolerate mask" (12/16/2016)    . Type II diabetes mellitus (HCC)      Family History  Problem Relation Age of Onset  . Heart attack Father   . Hypertension Mother   . Diabetes Mother   . Heart disease Brother        x 3   . Heart attack Brother        deceased  . Hypertension Sister   . Diabetes Sister   . Anesthesia problems Neg Hx   . Hypotension Neg Hx   . Malignant hyperthermia Neg Hx   . Pseudochol deficiency Neg Hx      Past Surgical History:  Procedure Laterality Date  . BACK SURGERY    . CARDIAC CATHETERIZATION N/A 09/25/2015   Procedure: Left Heart Cath and Coronary Angiography;  Surgeon: David W Harding, MD;  Location: [facility];  Service: Cardiovascular;  Laterality: N/A;  . CARDIAC CATHETERIZATION N/A 12/16/2016   Procedure: Left Heart Cath and Coronary Angiography;  Surgeon: David W Harding, MD;  Location: [facility];  Service: Cardiovascular;  Laterality: N/A;  . CARDIAC CATHETERIZATION N/A 12/16/2016   Procedure: Coronary Balloon Angioplasty;  Surgeon: David W Harding, MD;  Location: [facility];  Service: Cardiovascular;  Laterality: N/A;  . COLONOSCOPY W/ POLYPECTOMY    . CORONARY ANGIOPLASTY  09/25/2015   mid cir & om  . CORONARY ANGIOPLASTY WITH STENT PLACEMENT  10/09/2001      Synopsis: Referred in September 2019 for lung mass by Newlin, Enobong, MD  Subjective:   PATIENT ID: Andrew West, Andrew West  Chief Complaint  Patient presents with  . Consult    states he was having SOB, pcp obtained imaging and was dx with mass on lung. states he has alot of pain on right side. Reports he has been having SOB with dry cough for several months. states its painful to inhale and exhale.     Patient has a past medical history of significant for coronary disease with prior PCI.  Currently on aspirin and Plavix.  Followed by cardiology.  He is a former smoker, quit about 2.5 years ago, smoked for 35 + years.  Since today with history of progressive shortness of breath and left-sided intermittent chest pains.  He was recently evaluated in the emergency room for chest pain which led to chest imaging which revealed a 3 cm paratracheal mass as well as enlarged mediastinal and hilar adenopathy.  Patient referred to pulmonary for recommendations and tissue sampling and biopsy.  She currently denies fever, night sweats, weight loss, nausea vomiting.   Patient is having significant anxiety and depression related to the recent diagnosis of this mass.  He is very concerned.  Family history of lung cancer, gastric cancer, colon cancer. No history sarcoidosis. Currently on disability and [employer] for 30+ years.    Past Medical History:  Diagnosis Date  . Anxiety   . CAD (coronary artery disease)    a. s/p multiple PCIs with last cath 11/2016 with severe multivessel CAD, s/p PCTA to LCx but unable to pass stent  . Chronic leg pain    bilateral  . Chronic lower back pain   . COPD (chronic obstructive pulmonary disease) (HCC)   . Depression   . GERD (gastroesophageal reflux disease)    Takes Dexilant  . HLD (hyperlipidemia)   . Hypertension   . Rhabdomyolysis    h/o, r/t statins  . Sleep apnea    "can't tolerate mask" (12/16/2016)    . Type II diabetes mellitus (HCC)      Family History  Problem Relation Age of Onset  . Heart attack Father   . Hypertension Mother   . Diabetes Mother   . Heart disease Brother        x 3   . Heart attack Brother        deceased  . Hypertension Sister   . Diabetes Sister   . Anesthesia problems Neg Hx   . Hypotension Neg Hx   . Malignant hyperthermia Neg Hx   . Pseudochol deficiency Neg Hx      Past Surgical History:  Procedure Laterality Date  . BACK SURGERY    . CARDIAC CATHETERIZATION N/A 09/25/2015   Procedure: Left Heart Cath and Coronary Angiography;  Surgeon: David W Harding, MD;  Location: [facility];  Service: Cardiovascular;  Laterality: N/A;  . CARDIAC CATHETERIZATION N/A 12/16/2016   Procedure: Left Heart Cath and Coronary Angiography;  Surgeon: David W Harding, MD;  [facility]: [facility];  Service: Cardiovascular;  Laterality: N/A;  . CARDIAC CATHETERIZATION N/A 12/16/2016   Procedure: Coronary Balloon Angioplasty;  Surgeon: David W Harding, MD;  [facility]: [facility];  Service: Cardiovascular;  Laterality: N/A;  . COLONOSCOPY W/ POLYPECTOMY    . CORONARY ANGIOPLASTY  09/25/2015   mid cir & om  . CORONARY ANGIOPLASTY WITH STENT PLACEMENT  10/09/2001      Synopsis: Referred in September 2019 for lung mass by Newlin, Enobong, MD  Subjective:   PATIENT ID: Andrew West, Andrew West  Chief Complaint  Patient presents with  . Consult    states he was having SOB, pcp obtained imaging and was dx with mass on lung. states he has alot of pain on right side. Reports he has been having SOB with dry cough for several months. states its painful to inhale and exhale.     Patient has a past medical history of significant for coronary disease with prior PCI.  Currently on aspirin and Plavix.  Followed by cardiology.  He is a former smoker, quit about 2.5 years ago, smoked for 35 + years.  Since today with history of progressive shortness of breath and left-sided intermittent chest pains.  He was recently evaluated in the emergency room for chest pain which led to chest imaging which revealed a 3 cm paratracheal mass as well as enlarged mediastinal and hilar adenopathy.  Patient referred to pulmonary for recommendations and tissue sampling and biopsy.  She currently denies fever, night sweats, weight loss, nausea vomiting.   Patient is having significant anxiety and depression related to the recent diagnosis of this mass.  He is very concerned.  Family history of lung cancer, gastric cancer, colon cancer. No history sarcoidosis. Currently on disability and [employer] for 30+ years.    Past Medical History:  Diagnosis Date  . Anxiety   . CAD (coronary artery disease)    a. s/p multiple PCIs with last cath 11/2016 with severe multivessel CAD, s/p PCTA to LCx but unable to pass stent  . Chronic leg pain    bilateral  . Chronic lower back pain   . COPD (chronic obstructive pulmonary disease) (HCC)   . Depression   . GERD (gastroesophageal reflux disease)    Takes Dexilant  . HLD (hyperlipidemia)   . Hypertension   . Rhabdomyolysis    h/o, r/t statins  . Sleep apnea    "can't tolerate mask" (12/16/2016)    . Type II diabetes mellitus (HCC)      Family History  Problem Relation Age of Onset  . Heart attack Father   . Hypertension Mother   . Diabetes Mother   . Heart disease Brother        x 3   . Heart attack Brother        deceased  . Hypertension Sister   . Diabetes Sister   . Anesthesia problems Neg Hx   . Hypotension Neg Hx   . Malignant hyperthermia Neg Hx   . Pseudochol deficiency Neg Hx      Past Surgical History:  Procedure Laterality Date  . BACK SURGERY    . CARDIAC CATHETERIZATION N/A 09/25/2015   Procedure: Left Heart Cath and Coronary Angiography;  Surgeon: David W Harding, MD;  [facility]: [facility];  Service: Cardiovascular;  Laterality: N/A;  . CARDIAC CATHETERIZATION N/A 12/16/2016   Procedure: Left Heart Cath and Coronary Angiography;  Surgeon: David W Harding, MD;  [facility]: [facility];  Service: Cardiovascular;  Laterality: N/A;  . CARDIAC CATHETERIZATION N/A 12/16/2016   Procedure: Coronary Balloon Angioplasty;  Surgeon: David W Harding, MD;  [facility]: [facility];  Service: Cardiovascular;  Laterality: N/A;  . COLONOSCOPY W/ POLYPECTOMY    . CORONARY ANGIOPLASTY  09/25/2015   mid cir & om  . CORONARY ANGIOPLASTY WITH STENT PLACEMENT  10/09/2001      Synopsis: Referred in September 2019 for lung mass by Newlin, Enobong, MD  Subjective:   PATIENT ID: Andrew West, Andrew West  Chief Complaint  Patient presents with  . Consult    states he was having SOB, pcp obtained imaging and was dx with mass on lung. states he has alot of pain on right side. Reports he has been having SOB with dry cough for several months. states its painful to inhale and exhale.     Patient has a past medical history of significant for coronary disease with prior PCI.  Currently on aspirin and Plavix.  Followed by cardiology.  He is a former smoker, quit about 2.5 years ago, smoked for 35 + years.  Since today with history of progressive shortness of breath and left-sided intermittent chest pains.  He was recently evaluated in the emergency room for chest pain which led to chest imaging which revealed a 3 cm paratracheal mass as well as enlarged mediastinal and hilar adenopathy.  Patient referred to pulmonary for recommendations and tissue sampling and biopsy.  She currently denies fever, night sweats, weight loss, nausea vomiting.   Patient is having significant anxiety and depression related to the recent diagnosis of this mass.  He is very concerned.  Family history of lung cancer, gastric cancer, colon cancer. No history sarcoidosis. Currently on disability and [employer] for 30+ years.    Past Medical History:  Diagnosis Date  . Anxiety   . CAD (coronary artery disease)    a. s/p multiple PCIs with last cath 11/2016 with severe multivessel CAD, s/p PCTA to LCx but unable to pass stent  . Chronic leg pain    bilateral  . Chronic lower back pain   . COPD (chronic obstructive pulmonary disease) (HCC)   . Depression   . GERD (gastroesophageal reflux disease)    Takes Dexilant  . HLD (hyperlipidemia)   . Hypertension   . Rhabdomyolysis    h/o, r/t statins  . Sleep apnea    "can't tolerate mask" (12/16/2016)    . Type II diabetes mellitus (HCC)      Family History  Problem Relation Age of Onset  . Heart attack Father   . Hypertension Mother   . Diabetes Mother   . Heart disease Brother        x 3   . Heart attack Brother        deceased  . Hypertension Sister   . Diabetes Sister   . Anesthesia problems Neg Hx   . Hypotension Neg Hx   . Malignant hyperthermia Neg Hx   . Pseudochol deficiency Neg Hx      Past Surgical History:  Procedure Laterality Date  . BACK SURGERY    . CARDIAC CATHETERIZATION N/A 09/25/2015   Procedure: Left Heart Cath and Coronary Angiography;  Surgeon: David W Harding, MD;  [facility]: [facility];  Service: Cardiovascular;  Laterality: N/A;  . CARDIAC CATHETERIZATION N/A 12/16/2016   Procedure: Left Heart Cath and Coronary Angiography;  Surgeon: David W Harding, MD;  [facility]: [facility];  Service: Cardiovascular;  Laterality: N/A;  . CARDIAC CATHETERIZATION N/A 12/16/2016   Procedure: Coronary Balloon Angioplasty;  Surgeon: David W Harding, MD;  [facility]: [facility];  Service: Cardiovascular;  Laterality: N/A;  . COLONOSCOPY W/ POLYPECTOMY    . CORONARY ANGIOPLASTY  09/25/2015   mid cir & om  . CORONARY ANGIOPLASTY WITH STENT PLACEMENT  10/09/2001  

## 2018-08-18 ENCOUNTER — Telehealth: Payer: Self-pay | Admitting: Pulmonary Disease

## 2018-08-18 NOTE — Telephone Encounter (Signed)
Called and spoke with patient. Patient wanted to confirm time of PET scan Monday, time confirmed. Informed patient that his last dose of plavix should be taken Saturday. Starting Sunday he is to remain off the plavix until he has the EBUS procedure. Patient also requested something for pain. Informed by MD Icard that he is not willing to prescribe anything for pain at this time. Voiced understanding. Nothing further needed at this time.

## 2018-08-18 NOTE — Telephone Encounter (Signed)
Spoke with pt, he would like to know if is is supposed to get a biopsy on the same day as his bronch procedure. He is confused about what is going on. Please advise.

## 2018-08-21 ENCOUNTER — Encounter (HOSPITAL_COMMUNITY): Payer: Self-pay

## 2018-08-21 ENCOUNTER — Ambulatory Visit (HOSPITAL_COMMUNITY)
Admission: RE | Admit: 2018-08-21 | Discharge: 2018-08-21 | Disposition: A | Discharge status: Home / Self Care | Payer: Medicare Other | Source: Ambulatory Visit | Attending: Family Medicine | Admitting: Family Medicine

## 2018-08-21 ENCOUNTER — Other Ambulatory Visit: Payer: Self-pay

## 2018-08-21 ENCOUNTER — Encounter (HOSPITAL_COMMUNITY)
Admission: RE | Admit: 2018-08-21 | Discharge: 2018-08-21 | Disposition: A | Discharge status: Home / Self Care | Payer: Medicare Other | Source: Ambulatory Visit | Attending: Pulmonary Disease | Admitting: Pulmonary Disease

## 2018-08-21 ENCOUNTER — Telehealth: Payer: Self-pay

## 2018-08-21 DIAGNOSIS — R918 Other nonspecific abnormal finding of lung field: Secondary | ICD-10-CM | POA: Insufficient documentation

## 2018-08-21 LAB — GLUCOSE, CAPILLARY: GLUCOSE-CAPILLARY: 101 mg/dL — AB (ref 70–99)

## 2018-08-21 MED ORDER — FLUDEOXYGLUCOSE F - 18 (FDG) INJECTION
12.3000 | Freq: Once | INTRAVENOUS | Status: AC | PRN
  Administered 2018-08-21: 12.3 via INTRAVENOUS

## 2018-08-21 MED FILL — ACETAMINOPHEN/COD #3 TABLET: 300-30 | 7 days supply | Qty: 14 | Fill #3

## 2018-08-21 NOTE — Telephone Encounter (Signed)
   Primary Cardiologist:Kenneth C Hilty, MD  Chart reviewed as part of pre-operative protocol coverage. Last seen by Dr. Debara Pickett 02/27/18>> recommended follow up in 3 months but no office visit. Recently called in for chest pain >> advised to follow up in ER. Work up negative. Would recommended office visit prior to clearance. Also recent PET scan suspicious for metastatic disease.   Pre-op covering staff: - Please schedule appointment and call patient to inform them. - Please contact requesting surgeon's office via preferred method (i.e, phone, fax) to inform them of need for appointment prior to surgery.  [place], [name]  08/21/2018, 2:20 PM

## 2018-08-21 NOTE — Progress Notes (Signed)
Pre Procedure Anesthesia Consult done today at request of Dr Valeta Harms.  Dr R. Fitzgerald( anesthesia) saw patient for consult and asked that a call be placed to Dr Debara Pickett office for cardia clearance prior to procedure on 08/25/2018.  Called office and spoke with Ann,RN in Triage at office of Dr Debara Pickett and told her patient having endobronchial ultrasound on 08/25/2018 and needed cardiac clearance per Dr Leodis Sias in anesthesia.  Lelon Frohlich stated she would put in and take care of and either fax clearance and/or call back .

## 2018-08-21 NOTE — Telephone Encounter (Signed)
Spoke with pt and scheduled appt with Andrew Deforest, PA on October 2nd at 8:30am. Pt verbalized understanding. Will fax recommendations over to surgeon's office.

## 2018-08-21 NOTE — Telephone Encounter (Signed)
   [facility] Pre-operative Risk Assessment    Request for surgical clearance:  1. What type of surgery is being performed? Endobronchial Ultrasound  2. When is this surgery scheduled? August 25, 2018 Friday   3. What type of clearance is required (medical clearance vs. Pharmacy clearance to hold med vs. Both)? Both  4. Are there any medications that need to be held prior to surgery and how long? ?Plavix   5. Practice name and name of physician performing surgery? Dr. Valeta Harms (pulmonary).. Dr. Ola Spurr (anesthesiologist is asking for the clearance)    6. What is your office phone number [phone] Gillian Shields   7.   What is your office fax number [phone]  8.   Anesthesia type (None, local, MAC, general) ?              General

## 2018-08-21 NOTE — Patient Instructions (Addendum)
                CLEVELAND YARBRO  08/21/2018   Your procedure is scheduled on: 08/25/2018   Report to [facility] Main  Entrance  Report to admitting at     0800   AM    Call this number if you have problems the morning of surgery [phone]  Remember: Do not eat food or drink liquids :After Midnight. BRUSH YOUR TEETH MORNING OF SURGERY AND RINSE YOUR MOUTH OUT, NO CHEWING GUM CANDY OR MINTS.     Take these medicines the morning of surgery with A SIP OF WATER: Inhalers as usual and bring, Amlodipine, Flomax, nebivolol, PRozac, Flonoase if needed, Hydralazine, PRotonix, Ranexa, Imdur DO NOT TAKE ANY DIABETIC MEDICATIONS DAY OF YOUR SURGERY                               You may not have any metal on your body including hair pins and              piercings  Do not wear jewelry,  lotions, powders or perfumes, deodorant                          Men may shave face and neck.   Do not bring valuables to the hospital. [place].  Contacts, dentures or bridgework may not be worn into surgery.  .     Patients discharged the day of surgery will not be allowed to drive home.  Name and phone number of your driver: sister- Cheryln or Marca Ancona   Special Instructions: N/A              Please read over the following fact sheets you were given: _____________________________________________________________________

## 2018-08-22 ENCOUNTER — Other Ambulatory Visit: Payer: Self-pay | Admitting: Family Medicine

## 2018-08-22 DIAGNOSIS — J9859 Other diseases of mediastinum, not elsewhere classified: Secondary | ICD-10-CM

## 2018-08-22 LAB — HEMOGLOBIN A1C
Hgb A1c MFr Bld: 5.7 % — ABNORMAL HIGH (ref 4.8–5.6)
Mean Plasma Glucose: 117 mg/dL

## 2018-08-23 ENCOUNTER — Ambulatory Visit (INDEPENDENT_AMBULATORY_CARE_PROVIDER_SITE_OTHER): Payer: Medicare Other | Admitting: Physician Assistant

## 2018-08-23 ENCOUNTER — Encounter: Payer: Self-pay | Admitting: Physician Assistant

## 2018-08-23 VITALS — BP 129/90 | HR 71 | Ht 69.0 in | Wt 240.6 lb

## 2018-08-23 DIAGNOSIS — E785 Hyperlipidemia, unspecified: Secondary | ICD-10-CM

## 2018-08-23 DIAGNOSIS — Z01810 Encounter for preprocedural cardiovascular examination: Secondary | ICD-10-CM | POA: Diagnosis not present

## 2018-08-23 DIAGNOSIS — I10 Essential (primary) hypertension: Secondary | ICD-10-CM | POA: Diagnosis not present

## 2018-08-23 DIAGNOSIS — I25118 Atherosclerotic heart disease of native coronary artery with other forms of angina pectoris: Secondary | ICD-10-CM

## 2018-08-23 DIAGNOSIS — E119 Type 2 diabetes mellitus without complications: Secondary | ICD-10-CM | POA: Diagnosis not present

## 2018-08-23 DIAGNOSIS — C3490 Malignant neoplasm of unspecified part of unspecified bronchus or lung: Secondary | ICD-10-CM

## 2018-08-23 NOTE — Patient Instructions (Signed)
Andrew West, [name] recommends that you schedule a follow-up appointment in 3 months with Dr Debara Pickett.  If you need a refill on your cardiac medications before your next appointment, please call your pharmacy.

## 2018-08-23 NOTE — Progress Notes (Signed)
Patient seen on 08/23/2018 for cardiac clearance. Clearance in epic and also printed and attached to chart.

## 2018-08-23 NOTE — Progress Notes (Signed)
Cardiology Office Note    Date:  08/23/2018   ID:  Carola Frost, DOB 1965-12-19, MRN [id number]  PCP:  [name], MD  Cardiologist:  Dr. Rennis Golden   Chief Complaint  Patient presents with  . Medical Clearance    ENDOBRONCHIAL ULTRASOUND    History of Present Illness:  Andrew West is a 52 y.o. male with PMH of CAD, HTN, HLD, DM II, COPD, and tobacco use.  He had a previous stent placement in the left circumflex and obtuse marginal branch.  And also in-stent restenosis in the LAD.  He has known occluded nondominant RCA filled with collaterals.  His last stress test was on 07/07/2016 which showed EF 54%, normal resting in the stress perfusion image.  Patient underwent cardiac catheterization in January 2018 which showed 100% occluded OM 2, 100% occlusion of left circumflex treated with PTCA with 50% residual stenosis and unable to pass stent, 100% occluded LPDA, 90% OM 3, 80% OM4, 45% mid to distal LAD and 100% chronic occlusion of proximal RCA.  He was placed on aspirin, Plavix, amlodipine, Lipitor, Imdur, Ranexa and beta-blocker therapy.  Last echocardiogram obtained on 02/26/2018 showed EF 55 to 60%, grade 1 DD, mild LAE.  More recently, patient was seen in the ED for atypical chest pain on 08/08/2018.  Prior to that he had a CT chest without contrast on 07/31/2018 which revealed mediastinal lymphadenopathy as well's of focal soft tissue mass lesion in the right upper lobe.  PET scan is recommended.  PET scan image obtained on 08/21/2018 showed marked hypermetabolic right paratracheal and right suprahilar lesion compatible with malignancy, small hyper metabolic right cervical level 2 lymph nodes are suspicious for metastatic disease, no evidence of hypermetabolic disease in the abdomen or pelvis.  Patient has intended to have endobronchial ultrasound on 08/25/2018.  He presents today for preoperative clearance.  According to patient, since January of last year, he continued to have chronic  chest pain on a daily basis.  This occurs mostly with exertion.  He also has shortness of breath with exertion as well.  The frequency of the chest discomfort has not changed compared to last year.  His symptom and symptoms to be quite stable.  I discussed the case with DOD Dr. Rennis Golden who also is his primary cardiologist, his cardiac symptom seems to be quite stable at this time with no change in the frequency or duration of the chest pain.  He has upcoming endobronchial ultrasound is very important since he likely has lung cancer.  We do not think any further testing is necessary which can potentially delay upcoming procedure.  Since he does not have any heart failure symptom either.  From our perspective, he is stable to proceed with endobronchial ultrasound.  His last dose of Plavix was on 08/19/2018.  The patient was also seen by Dr. Rennis Golden during today's visit as well, he agrees with the plan.  Past Medical History:  Diagnosis Date  . Anxiety   . CAD (coronary artery disease)    a. s/p multiple PCIs with last cath 11/2016 with severe multivessel CAD, s/p PCTA to LCx but unable to pass stent  . Chronic leg pain    bilateral  . Chronic lower back pain   . COPD (chronic obstructive pulmonary disease) (HCC)   . Depression   . GERD (gastroesophageal reflux disease)    Takes Dexilant  . HLD (hyperlipidemia)   . Hypertension   . Rhabdomyolysis    h/o, r/t statins  .  Sleep apnea    "can't tolerate mask" (12/16/2016)  . Type II diabetes mellitus (HCC)     Past Surgical History:  Procedure Laterality Date  . BACK SURGERY    . CARDIAC CATHETERIZATION N/A 09/25/2015   Procedure: Left Heart Cath and Coronary Angiography;  Surgeon: Marykay Lex, MD;  Location: [facility] INVASIVE CV LAB;  Service: Cardiovascular;  Laterality: N/A;  . CARDIAC CATHETERIZATION N/A 12/16/2016   Procedure: Left Heart Cath and Coronary Angiography;  Surgeon: Marykay Lex, MD;  Location: [facility] INVASIVE CV LAB;  Service:  Cardiovascular;  Laterality: N/A;  . CARDIAC CATHETERIZATION N/A 12/16/2016   Procedure: Coronary Balloon Angioplasty;  Surgeon: Marykay Lex, MD;  Location: [facility] INVASIVE CV LAB;  Service: Cardiovascular;  Laterality: N/A;  . COLONOSCOPY W/ POLYPECTOMY    . CORONARY ANGIOPLASTY  09/25/2015   mid cir & om  . CORONARY ANGIOPLASTY WITH STENT PLACEMENT  10/09/2001   PTCA & stenting of mid AV circumflex; 2.5x48mm Pixel stent  . CORONARY ANGIOPLASTY WITH STENT PLACEMENT  12/13/2001   PCI with stent to mid L circumflex, 95% stenosis to 0% residual  . CORONARY ANGIOPLASTY WITH STENT PLACEMENT  10/10/2003   PCI to mid AV circumflex; LAD 30% disease; RCA 100% occluded prox.  . CORONARY ANGIOPLASTY WITH STENT PLACEMENT  09/01/2011   PCI with stenting with bare metal stent to mid AV groove circumflex and PDA  . CORONARY ANGIOPLASTY WITH STENT PLACEMENT  10/17/2011   cutting balloon angioplasty of ostial lateral OM1 branch and bifurcation AV groove circumflex OM junction; stenosis reduced to 0%  . EXCISIONAL HEMORRHOIDECTOMY    . LEFT HEART CATHETERIZATION WITH CORONARY ANGIOGRAM N/A 10/18/2011   Procedure: LEFT HEART CATHETERIZATION WITH CORONARY ANGIOGRAM;  Surgeon: Marykay Lex, MD;  Location: [facility] CATH LAB;  Service: Cardiovascular;  Laterality: N/A;  . LUMBAR LAMINECTOMY/DECOMPRESSION MICRODISCECTOMY  03/31/2012   Procedure: LUMBAR LAMINECTOMY/DECOMPRESSION MICRODISCECTOMY 1 LEVEL;  Surgeon: Temple Pacini, MD;  Location: [facility] NEURO ORS;  Service: Neurosurgery;  Laterality: Left;  . TRANSTHORACIC ECHOCARDIOGRAM  07/28/2011   EF 55-65%; LVH, grade 1 diastolic dysfunction;     Current Medications: Outpatient Medications Prior to Visit  Medication Sig Dispense Refill  . acetaminophen-codeine (TYLENOL #3) 300-30 MG tablet Take 1 tablet by mouth every 12 (twelve) hours as needed for moderate pain. 60 tablet 0  . Albuterol Sulfate (PROAIR RESPICLICK) 108 (90 Base) MCG/ACT AEPB Inhale 1 puff into the lungs  every 6 (six) hours as needed (shortness of breath). 3 each 3  . amLODipine (NORVASC) 10 MG tablet Take 1 tablet (10 mg total) by mouth daily. 90 tablet 1  . aspirin 81 MG tablet Take 1 tablet (81 mg total) daily by mouth. 30 tablet   . baclofen (LIORESAL) 10 MG tablet Take 1 tablet (10 mg total) by mouth 3 (three) times daily. 90 each 1  . benzonatate (TESSALON) 100 MG capsule Take 1 capsule (100 mg total) by mouth every 8 (eight) hours. 21 capsule 0  . Blood Glucose Monitoring Suppl (ACCU-CHEK AVIVA PLUS) w/Device KIT Use as dircted 1 kit 0  . cetirizine (ZYRTEC) 10 MG tablet Take 1 tablet (10 mg total) daily by mouth. 30 tablet 1  . Cholecalciferol (VITAMIN D) 2000 units tablet Take 1 tablet (2,000 Units total) by mouth daily. 30 tablet 1  . clopidogrel (PLAVIX) 75 MG tablet Take 1 tablet (75 mg total) by mouth daily. 90 tablet 1  . diclofenac sodium (VOLTAREN) 1 % GEL Apply 2 g topically 4 (  four) times daily. 1 Tube 0  . FLUoxetine (PROZAC) 40 MG capsule Take 1 capsule (40 mg total) by mouth daily. 90 capsule 1  . fluticasone (FLONASE) 50 MCG/ACT nasal spray Place 2 sprays into both nostrils daily. (Patient taking differently: Place 2 sprays into both nostrils daily as needed for allergies. ) 16 g 6  . Fluticasone-Salmeterol (ADVAIR) 100-50 MCG/DOSE AEPB Inhale 1 puff into the lungs 2 (two) times daily. 1 each 3  . furosemide (LASIX) 20 MG tablet Take 20 mg by mouth as needed.     Marland Kitchen glucose blood (ACCU-CHEK AVIVA) test strip Use as instructed 100 each 12  . hydrALAZINE (APRESOLINE) 25 MG tablet Take 1 tablet (25 mg total) by mouth 2 (two) times daily. 180 tablet 1  . HYDROcodone-acetaminophen (NORCO/VICODIN) 5-325 MG tablet Take 1 tablet by mouth every 4 (four) hours as needed. 10 tablet 0  . hydrocortisone-pramoxine (ANALPRAM-HC) 2.5-1 % rectal cream PLACE 1 APPLICATION RECTALLY 3 (THREE) TIMES DAILY. (Patient taking differently: Place 1 application rectally as needed. ) 30 g 1  . isosorbide  mononitrate (IMDUR) 120 MG 24 hr tablet Take 1 tablet (120 mg total) by mouth daily. (Patient taking differently: Take 60 mg by mouth 2 (two) times daily. ) 90 tablet 1  . losartan-hydrochlorothiazide (HYZAAR) 100-25 MG tablet Take 1 tablet by mouth daily. 90 tablet 1  . metFORMIN (GLUCOPHAGE) 1000 MG tablet Take 1 tablet (1,000 mg total) by mouth 2 (two) times daily with a meal. 180 tablet 1  . Nebivolol HCl 20 MG TABS Take 1 tablet (20 mg total) by mouth daily. 90 tablet 3  . nitroGLYCERIN (NITROSTAT) 0.4 MG SL tablet Place 1 tablet (0.4 mg total) under the tongue every 5 (five) minutes as needed for chest pain. 25 tablet 2  . pantoprazole (PROTONIX) 40 MG tablet Take 1 tablet (40 mg total) by mouth 2 (two) times daily. 180 tablet 1  . prazosin (MINIPRESS) 1 MG capsule Take 1 capsule (1 mg total) by mouth at bedtime. For nightmares 30 capsule 2  . ranolazine (RANEXA) 1000 MG SR tablet Take 1 tablet (1,000 mg total) by mouth 2 (two) times daily. (Patient taking differently: Take 1,000 mg by mouth daily. ) 180 tablet 3  . rosuvastatin (CRESTOR) 20 MG tablet Take 1 tablet (20 mg total) by mouth daily. 90 tablet 1  . tamsulosin (FLOMAX) 0.4 MG CAPS capsule Take 1 capsule (0.4 mg total) by mouth daily. 30 capsule 5  . tiotropium (SPIRIVA HANDIHALER) 18 MCG inhalation capsule Place 1 capsule (18 mcg total) into inhaler and inhale daily. 90 capsule 1  . traZODone (DESYREL) 50 MG tablet Take 1 tablet (50 mg total) by mouth at bedtime as needed. for sleep 30 tablet 5  . vitamin B-12 (CYANOCOBALAMIN) 500 MCG tablet Take 1 tablet (500 mcg total) by mouth daily. 30 tablet 1   No facility-administered medications prior to visit.      Allergies:   Iohexol   Social History   Socioeconomic History  . Marital status: Married    Spouse name: Not on file  . Number of children: Not on file  . Years of education: Not on file  . Highest education level: Not on file  Occupational History  . Not on file    Social Needs  . Financial resource strain: Not on file  . Food insecurity:    Worry: Not on file    Inability: Not on file  . Transportation needs:    Medical: Not on  file    Non-medical: Not on file  Tobacco Use  . Smoking status: Former Smoker    Packs/day: 0.25    Years: 25.00    Pack years: 6.25    Types: Cigarettes    Last attempt to quit: 05/24/2015    Years since quitting: 3.2  . Smokeless tobacco: Never Used  Substance and Sexual Activity  . Alcohol use: No    Alcohol/week: 0.0 standard drinks  . Drug use: No  . Sexual activity: Yes  Lifestyle  . Physical activity:    Days per week: Not on file    Minutes per session: Not on file  . Stress: Not on file  Relationships  . Social connections:    Talks on phone: Not on file    Gets together: Not on file    Attends religious service: Not on file    Active member of club or organization: Not on file    Attends meetings of clubs or organizations: Not on file    Relationship status: Not on file  Other Topics Concern  . Not on file  Social History Narrative  . Not on file     Family History:  The patient's family history includes Diabetes in his mother and sister; Heart attack in his brother and father; Heart disease in his brother; Hypertension in his mother and sister.   ROS:   Please see the history of present illness.    ROS All other systems reviewed and are negative.   PHYSICAL EXAM:   VS:  BP 129/90   Pulse 71   Ht 5\' 9"  (1.753 m)   Wt 240 lb 9.6 oz (109.1 kg)   BMI 35.53 kg/m    GEN: Well nourished, well developed, in no acute distress  HEENT: normal  Neck: no JVD, carotid bruits, or masses Cardiac: RRR; no murmurs, rubs, or gallops,no edema  Respiratory:  clear to auscultation bilaterally, normal work of breathing GI: soft, nontender, nondistended, + BS MS: no deformity or atrophy  Skin: warm and dry, no rash Neuro:  Alert and Oriented x 3, Strength and sensation are intact Psych: euthymic mood,  full affect  Wt Readings from Last 3 Encounters:  08/23/18 240 lb 9.6 oz (109.1 kg)  08/21/18 237 lb (107.5 kg)  08/17/18 244 lb 12.8 oz (111 kg)      Studies/Labs Reviewed:   EKG:  EKG is ordered today.  The ekg ordered today demonstrates normal sinus rhythm without significant ST-T wave changes  Recent Labs: 11/29/2017: B Natriuretic Peptide 50.4 07/25/2018: ALT 17; Magnesium 1.8 08/17/2018: BUN 12; Creatinine, Ser 0.96; Hemoglobin 13.8; Platelets 220.0; Potassium 3.7; Sodium 139   Lipid Panel    Component Value Date/Time   CHOL 180 07/25/2018 0949   TRIG 149 07/25/2018 0949   HDL 24 (L) 07/25/2018 0949   CHOLHDL 7.5 (H) 07/25/2018 0949   CHOLHDL 5.2 (H) 09/21/2016 0900   VLDL 31 (H) 09/21/2016 0900   LDLCALC 126 (H) 07/25/2018 0949    Additional studies/ records that were reviewed today include:   Cath 12/16/2017  ____CULPRIT LESION________  Maxwell Caul to 2nd Mrg lesion, 100 %stenosed. In-stent stenosis  Mid Cx-1 lesion, 100 %stenosed. In an pre-stent stenosis --> PTCA only performed Post intervention, there is a 50% residual stenosis.  Mid Cx-2 lesion, 100 %stenosed. PTCA only performed Post intervention, there is a 50% residual stenosis.  Ost LPDA to LPDA lesion, 100 %stenosed.  Ost 3rd Mrg to 3rd Mrg lesion, 90 %stenosed.  Ost 4th Mrg to 4th Mrg lesion, 80 %stenosed.  __________________________  Mid LAD to Dist LAD lesion, 45 %stenosed.  Ramus lesion, 100 %stenosed. - In-stent stenosis  Prox RCA lesion, 100 %stenosed. - Chronic  The left ventricular systolic function is normal.  LV end diastolic pressure is normal.  There is no aortic valve stenosis.   Unfortunately the patient had total occlusion of the stented segment in the circumflex that involves the dominant circumflex vessels. The stented OM 2 branch now 100% occluded. Unfortunately, suboptimal minimally effective PTCA was performed. Unable to pass a stent to the desired location. The downstream  vessels were extremely diffusely diseased and likely not good targets for bypass, however this could be considered.  Residual diffuse disease was the best that can be done with PTCA. Unable to pass stent. Very rigid lesion wiring high pressure inflation of a noncompliant balloon, but still unable to pass a stent.  PLAN: Overnight observation and prolonged PTCA procedure. We'll need to maximize medical management per Dr. Rennis Golden   Continued dual antiplatelet therapy    Echo 02/26/2018 LV EF: 55% -   60% Study Conclusions  - Left ventricle: The cavity size was normal. Wall thickness was   normal. Systolic function was normal. The estimated ejection   fraction was in the range of 55% to 60%. Possible hypokinesis of   the inferolateral myocardium. Doppler parameters are consistent   with abnormal left ventricular relaxation (grade 1 diastolic   dysfunction). - Mitral valve: Calcified annulus. - Left atrium: The atrium was mildly dilated.  Impressions:  - Possible mild hypokinesis of the inferolateral wall with overall   normal LV systolic function; mild diastolic dysfunction; mild   LAE.    ASSESSMENT:    1. Preop cardiovascular exam   2. Coronary artery disease of native artery of native heart with stable angina pectoris (HCC)   3. Essential hypertension   4. Hyperlipidemia, unspecified hyperlipidemia type   5. Controlled type 2 diabetes mellitus without complication, without long-term current use of insulin (HCC)   6. Malignant neoplasm of lung, unspecified laterality, unspecified part of lung (HCC)      PLAN:  In order of problems listed above:  1. Preoperative clearance: Recently diagnosed with lung cancer, has upcoming endobronchial ultrasound this Friday.  Patient has been holding Plavix since 9/28.  His chronic chest discomfort likely originate from small vessel disease.  I do not recommend further testing which may delay the upcoming procedure.  I discussed the  case with Dr. Rennis Golden who also met the patient today.  He agrees that no further work-up is necessary.  Patient should proceed with lung cancer work-up as soon as possible  2. CAD: Chronic chest pain likely from small vessel disease.  Last cardiac catheterization was in January 2018.  He did undergo balloon angioplasty of the mid left circumflex lesion, however it was only reduced to 50% and no stent was able to get crossed over.  3. Hypertension: Blood pressure stable on current therapy  4. Hyperlipidemia: Continue statin.  Last LDL obtained on 07/25/2018 was elevated, Lipitor switch to Crestor.  5. DM2: Managed by primary care provider.    Medication Adjustments/Labs and Tests Ordered: Current medicines are reviewed at length with the patient today.  Concerns regarding medicines are outlined above.  Medication changes, Labs and Tests ordered today are listed in the Patient Instructions below. Patient Instructions  Azalee Course, PA recommends that you schedule a follow-up appointment in 3 months with Dr Rennis Golden.  If you need a refill on your cardiac medications before your next appointment, please call your pharmacy.    Ramond Dial, [place]  08/23/2018 9:27 AM    [facility] [address] [place], [place], [place]  [address] Phone: [phone]; Fax: [phone]

## 2018-08-25 ENCOUNTER — Ambulatory Visit (HOSPITAL_COMMUNITY)
Admission: RE | Admit: 2018-08-25 | Discharge: 2018-08-25 | Disposition: A | Discharge status: Home / Self Care | Payer: Medicare Other | Source: Ambulatory Visit | Attending: Pulmonary Disease | Admitting: Pulmonary Disease

## 2018-08-25 ENCOUNTER — Ambulatory Visit (HOSPITAL_COMMUNITY): Payer: Medicare Other | Admitting: Anesthesiology

## 2018-08-25 ENCOUNTER — Encounter (HOSPITAL_COMMUNITY): Payer: Self-pay | Admitting: *Deleted

## 2018-08-25 ENCOUNTER — Encounter (HOSPITAL_COMMUNITY)
Admission: RE | Disposition: A | Discharge status: Home / Self Care | Payer: Self-pay | Source: Ambulatory Visit | Attending: Pulmonary Disease

## 2018-08-25 DIAGNOSIS — J9859 Other diseases of mediastinum, not elsewhere classified: Secondary | ICD-10-CM

## 2018-08-25 DIAGNOSIS — G4730 Sleep apnea, unspecified: Secondary | ICD-10-CM | POA: Insufficient documentation

## 2018-08-25 DIAGNOSIS — F329 Major depressive disorder, single episode, unspecified: Secondary | ICD-10-CM | POA: Diagnosis not present

## 2018-08-25 DIAGNOSIS — J398 Other specified diseases of upper respiratory tract: Secondary | ICD-10-CM | POA: Diagnosis not present

## 2018-08-25 DIAGNOSIS — Z801 Family history of malignant neoplasm of trachea, bronchus and lung: Secondary | ICD-10-CM | POA: Insufficient documentation

## 2018-08-25 DIAGNOSIS — M79604 Pain in right leg: Secondary | ICD-10-CM | POA: Insufficient documentation

## 2018-08-25 DIAGNOSIS — Z8249 Family history of ischemic heart disease and other diseases of the circulatory system: Secondary | ICD-10-CM | POA: Insufficient documentation

## 2018-08-25 DIAGNOSIS — C801 Malignant (primary) neoplasm, unspecified: Secondary | ICD-10-CM | POA: Diagnosis not present

## 2018-08-25 DIAGNOSIS — Z87891 Personal history of nicotine dependence: Secondary | ICD-10-CM | POA: Insufficient documentation

## 2018-08-25 DIAGNOSIS — G8929 Other chronic pain: Secondary | ICD-10-CM | POA: Diagnosis not present

## 2018-08-25 DIAGNOSIS — R918 Other nonspecific abnormal finding of lung field: Secondary | ICD-10-CM | POA: Diagnosis not present

## 2018-08-25 DIAGNOSIS — J449 Chronic obstructive pulmonary disease, unspecified: Secondary | ICD-10-CM | POA: Insufficient documentation

## 2018-08-25 DIAGNOSIS — R590 Localized enlarged lymph nodes: Secondary | ICD-10-CM | POA: Diagnosis not present

## 2018-08-25 DIAGNOSIS — Z7984 Long term (current) use of oral hypoglycemic drugs: Secondary | ICD-10-CM | POA: Insufficient documentation

## 2018-08-25 DIAGNOSIS — M79605 Pain in left leg: Secondary | ICD-10-CM | POA: Diagnosis not present

## 2018-08-25 DIAGNOSIS — R0602 Shortness of breath: Secondary | ICD-10-CM | POA: Diagnosis not present

## 2018-08-25 DIAGNOSIS — C3411 Malignant neoplasm of upper lobe, right bronchus or lung: Secondary | ICD-10-CM | POA: Diagnosis not present

## 2018-08-25 DIAGNOSIS — Z955 Presence of coronary angioplasty implant and graft: Secondary | ICD-10-CM | POA: Diagnosis not present

## 2018-08-25 DIAGNOSIS — I10 Essential (primary) hypertension: Secondary | ICD-10-CM | POA: Diagnosis not present

## 2018-08-25 DIAGNOSIS — M6282 Rhabdomyolysis: Secondary | ICD-10-CM | POA: Insufficient documentation

## 2018-08-25 DIAGNOSIS — I252 Old myocardial infarction: Secondary | ICD-10-CM | POA: Insufficient documentation

## 2018-08-25 DIAGNOSIS — K219 Gastro-esophageal reflux disease without esophagitis: Secondary | ICD-10-CM | POA: Insufficient documentation

## 2018-08-25 DIAGNOSIS — C781 Secondary malignant neoplasm of mediastinum: Secondary | ICD-10-CM | POA: Insufficient documentation

## 2018-08-25 DIAGNOSIS — Z86010 Personal history of colonic polyps: Secondary | ICD-10-CM | POA: Diagnosis not present

## 2018-08-25 DIAGNOSIS — Z7982 Long term (current) use of aspirin: Secondary | ICD-10-CM | POA: Insufficient documentation

## 2018-08-25 DIAGNOSIS — Z8489 Family history of other specified conditions: Secondary | ICD-10-CM | POA: Insufficient documentation

## 2018-08-25 DIAGNOSIS — C969 Malignant neoplasm of lymphoid, hematopoietic and related tissue, unspecified: Secondary | ICD-10-CM | POA: Insufficient documentation

## 2018-08-25 DIAGNOSIS — C771 Secondary and unspecified malignant neoplasm of intrathoracic lymph nodes: Secondary | ICD-10-CM | POA: Diagnosis not present

## 2018-08-25 DIAGNOSIS — Z79899 Other long term (current) drug therapy: Secondary | ICD-10-CM | POA: Diagnosis not present

## 2018-08-25 DIAGNOSIS — E119 Type 2 diabetes mellitus without complications: Secondary | ICD-10-CM | POA: Diagnosis not present

## 2018-08-25 DIAGNOSIS — Z888 Allergy status to other drugs, medicaments and biological substances status: Secondary | ICD-10-CM | POA: Insufficient documentation

## 2018-08-25 DIAGNOSIS — Z833 Family history of diabetes mellitus: Secondary | ICD-10-CM | POA: Insufficient documentation

## 2018-08-25 DIAGNOSIS — F419 Anxiety disorder, unspecified: Secondary | ICD-10-CM | POA: Diagnosis not present

## 2018-08-25 DIAGNOSIS — Z7902 Long term (current) use of antithrombotics/antiplatelets: Secondary | ICD-10-CM | POA: Insufficient documentation

## 2018-08-25 DIAGNOSIS — Z800 Family history of malignant neoplasm of digestive organs: Secondary | ICD-10-CM | POA: Insufficient documentation

## 2018-08-25 DIAGNOSIS — E785 Hyperlipidemia, unspecified: Secondary | ICD-10-CM | POA: Insufficient documentation

## 2018-08-25 DIAGNOSIS — J9809 Other diseases of bronchus, not elsewhere classified: Secondary | ICD-10-CM | POA: Diagnosis not present

## 2018-08-25 DIAGNOSIS — I25119 Atherosclerotic heart disease of native coronary artery with unspecified angina pectoris: Secondary | ICD-10-CM | POA: Insufficient documentation

## 2018-08-25 DIAGNOSIS — J40 Bronchitis, not specified as acute or chronic: Secondary | ICD-10-CM | POA: Diagnosis not present

## 2018-08-25 HISTORY — PX: BRONCHIAL BIOPSY: SHX5109

## 2018-08-25 HISTORY — PX: FINE NEEDLE ASPIRATION: SHX6590

## 2018-08-25 HISTORY — PX: FLEXIBLE BRONCHOSCOPY: SHX5094

## 2018-08-25 HISTORY — PX: ENDOBRONCHIAL ULTRASOUND: SHX5096

## 2018-08-25 LAB — GLUCOSE, CAPILLARY: Glucose-Capillary: 105 mg/dL — ABNORMAL HIGH (ref 70–99)

## 2018-08-25 SURGERY — ENDOBRONCHIAL ULTRASOUND (EBUS)
Anesthesia: General | Laterality: Bilateral

## 2018-08-25 MED ORDER — LACTATED RINGERS IV SOLN
INTRAVENOUS | Status: DC
  Administered 2018-08-25: 1000 mL via INTRAVENOUS

## 2018-08-25 MED ORDER — MIDAZOLAM HCL 2 MG/2ML IJ SOLN
INTRAMUSCULAR | Status: AC
  Filled 2018-08-25: qty 2

## 2018-08-25 MED ORDER — FENTANYL CITRATE (PF) 100 MCG/2ML IJ SOLN
INTRAMUSCULAR | Status: AC
  Filled 2018-08-25: qty 2

## 2018-08-25 MED ORDER — MIDAZOLAM HCL 5 MG/5ML IJ SOLN
INTRAMUSCULAR | Status: DC | PRN
  Administered 2018-08-25: 2 mg via INTRAVENOUS

## 2018-08-25 MED ORDER — ROCURONIUM BROMIDE 10 MG/ML (PF) SYRINGE
PREFILLED_SYRINGE | INTRAVENOUS | Status: DC | PRN
  Administered 2018-08-25: 60 mg via INTRAVENOUS

## 2018-08-25 MED ORDER — PROPOFOL 10 MG/ML IV BOLUS
INTRAVENOUS | Status: DC | PRN
  Administered 2018-08-25: 150 mg via INTRAVENOUS

## 2018-08-25 MED ORDER — FENTANYL CITRATE (PF) 100 MCG/2ML IJ SOLN
INTRAMUSCULAR | Status: DC | PRN
  Administered 2018-08-25 (×2): 50 ug via INTRAVENOUS

## 2018-08-25 MED ORDER — ONDANSETRON HCL 4 MG/2ML IJ SOLN
4.0000 mg | Freq: Once | INTRAMUSCULAR | Status: AC | PRN
  Administered 2018-08-25: 4 mg via INTRAVENOUS

## 2018-08-25 MED ORDER — FENTANYL CITRATE (PF) 100 MCG/2ML IJ SOLN: 25.0000 ug | INTRAMUSCULAR | Status: DC | PRN

## 2018-08-25 MED ORDER — LIDOCAINE 2% (20 MG/ML) 5 ML SYRINGE
INTRAMUSCULAR | Status: DC | PRN
  Administered 2018-08-25: 100 mg via INTRAVENOUS

## 2018-08-25 MED ORDER — PROPOFOL 10 MG/ML IV BOLUS
INTRAVENOUS | Status: AC
  Filled 2018-08-25: qty 60

## 2018-08-25 MED ORDER — ALBUTEROL SULFATE (2.5 MG/3ML) 0.083% IN NEBU
INHALATION_SOLUTION | RESPIRATORY_TRACT | Status: AC
  Filled 2018-08-25: qty 3

## 2018-08-25 MED ORDER — SUGAMMADEX SODIUM 200 MG/2ML IV SOLN
INTRAVENOUS | Status: DC | PRN
  Administered 2018-08-25: 200 mg via INTRAVENOUS

## 2018-08-25 MED ORDER — CLOPIDOGREL BISULFATE 75 MG PO TABS: 75.0000 mg | ORAL_TABLET | Freq: Every day | ORAL | 1 refills | Status: DC

## 2018-08-25 MED ORDER — IPRATROPIUM-ALBUTEROL 20-100 MCG/ACT IN AERS
INHALATION_SPRAY | RESPIRATORY_TRACT | Status: DC | PRN
  Administered 2018-08-25: 1 via RESPIRATORY_TRACT

## 2018-08-25 NOTE — Discharge Instructions (Signed)
Flexible Bronchoscopy, Care After This sheet gives you information about how to care for yourself after your procedure. Your health care provider may also give you more specific instructions. If you have problems or questions, contact your health care provider. What can I expect after the procedure? After the procedure, it is common to have the following symptoms for 24-48 hours:  A cough that is worse than it was before the procedure.  A low-grade fever.  A sore throat or hoarse voice.  Small streaks of blood in the mucus from your lungs (sputum), if tissue samples were removed (biopsy).  Follow these instructions at home: Eating and drinking  After your numbness is gone and your cough and gag reflexes have returned, you may start eating only soft foods and slowly drinking liquids.  The day after the procedure, return to your normal diet. Driving  Do not drive for 24 hours if you were given a medicine to help you relax (sedative).  Do not drive or use heavy machinery while taking prescription pain medicine. General instructions  Take over-the-counter and prescription medicines only as told by your health care provider.  Return to your normal activities as told by your health care provider. Ask your health care provider what activities are safe for you.  Do not use any products that contain nicotine or tobacco, such as cigarettes and e-cigarettes. If you need help quitting, ask your health care provider.  Keep all follow-up visits as told by your health care provider. This is important, especially if you had a biopsy taken. Get help right away if:  You have shortness of breath that gets worse.  You become light-headed or feel like you might faint.  You have chest pain.  You cough up more than a small amount of blood.  The amount of blood you cough up increases. Summary  Common symptoms in the 24-48 hours following a flexible bronchoscopy include cough, low-grade fever,  sore throat or hoarse voice, and blood-streaked mucus from the lungs (if you had a biopsy).  Do not eat or drink anything (including water) for 2 hours after your procedure, or until your local anesthetic has worn off. You can return to your normal diet the day after the procedure.  Get help right away if you develop worsening shortness of breath, have chest pain, become light-headed, or cough up more than a small amount of blood. This information is not intended to replace advice given to you by your health care provider. Make sure you discuss any questions you have with your health care provider.  Document Released: 05/28/2005 Document Revised: 11/26/2016 Document Reviewed: 11/26/2016 Elsevier Interactive Patient Education  2017 [employer].

## 2018-08-25 NOTE — Anesthesia Procedure Notes (Signed)
Procedure Name: Intubation Date/Time: 08/25/2018 10:04 AM Performed by: [place] Williamson [place] C, CRNA Pre-anesthesia Checklist: Patient identified, Emergency Drugs available, Suction available and Patient being monitored Patient Re-evaluated:Patient Re-evaluated prior to induction Oxygen Delivery Method: Circle system utilized Preoxygenation: Pre-oxygenation with 100% oxygen Induction Type: IV induction Ventilation: Mask ventilation without difficulty Laryngoscope Size: Mac and 4 Grade View: Grade I Tube type: Oral Tube size: 9.0 mm Number of attempts: 1 Airway Equipment and Method: Stylet and Oral airway Placement Confirmation: ETT inserted through vocal cords under direct vision,  positive ETCO2 and breath sounds checked- equal and bilateral Secured at: 23 cm Tube secured with: Tape Dental Injury: Teeth and Oropharynx as per pre-operative assessment

## 2018-08-25 NOTE — Transfer of Care (Signed)
Immediate Anesthesia Transfer of Care Note  Patient: Andrew West  Procedure(s) Performed: ENDOBRONCHIAL ULTRASOUND (Bilateral ) FINE NEEDLE ASPIRATION  Patient Location: PACU and Endoscopy Unit  Anesthesia Type:General  Level of Consciousness: awake, alert  and oriented  Airway & Oxygen Therapy: Patient Spontanous Breathing and Patient connected to face mask oxygen  Post-op Assessment: Report given to RN and Post -op Vital signs reviewed and stable  Post vital signs: Reviewed and stable  Last Vitals:  Vitals Value Taken Time  BP 115/74 08/25/2018 11:25 AM  Temp    Pulse 80 08/25/2018 11:26 AM  Resp 24 08/25/2018 11:26 AM  SpO2 100 % 08/25/2018 11:26 AM  Vitals shown include unvalidated device data.  Last Pain:  Vitals:   08/25/18 0903  TempSrc: Oral  PainSc: 4          Complications: No apparent anesthesia complications

## 2018-08-25 NOTE — Anesthesia Preprocedure Evaluation (Addendum)
Anesthesia Evaluation  Patient identified by MRN, date of birth, ID band Patient awake    Reviewed: Allergy & Precautions, NPO status , Patient's Chart, lab work & pertinent test results, reviewed documented beta blocker date and time   Airway Mallampati: II  TM Distance: >3 FB Neck ROM: Full    Dental  (+) Edentulous Upper, Edentulous Lower   Pulmonary asthma , sleep apnea , COPD, former smoker,  Lung mass   Pulmonary exam normal breath sounds clear to auscultation       Cardiovascular hypertension, Pt. on medications and Pt. on home beta blockers + angina with exertion + CAD, + Past MI, + Cardiac Stents and + DOE  Normal cardiovascular exam Rhythm:Regular Rate:Normal  Echo 02/26/18: Study Conclusions  - Left ventricle: The cavity size was normal. Wall thickness was normal. Systolic function was normal. The estimated ejection fraction was in the range of 55% to 60%. Possible hypokinesis of the inferolateral myocardium. Doppler parameters are consistent with abnormal left ventricular relaxation (grade 1 diastolic   dysfunction). - Mitral valve: Calcified annulus. - Left atrium: The atrium was mildly dilated.   last stress test was on 07/07/2016 which showed EF 54%, normal resting in the stress perfusion image   Neuro/Psych PSYCHIATRIC DISORDERS Anxiety Depression  Neuromuscular disease    GI/Hepatic Neg liver ROS, GERD  Medicated,  Endo/Other  diabetesObesity   Renal/GU negative Renal ROS     Musculoskeletal negative musculoskeletal ROS (+)   Abdominal   Peds  Hematology  (+) Blood dyscrasia (Plavix), ,   Anesthesia Other Findings Day of surgery medications reviewed with the patient.  Reproductive/Obstetrics                           Anesthesia Physical Anesthesia Plan  ASA: IV  Anesthesia Plan: General   Post-op Pain Management:    Induction: Intravenous  PONV Risk Score and  Plan: 2 and Ondansetron and Midazolam  Airway Management Planned: Oral ETT  Additional Equipment:   Intra-op Plan:   Post-operative Plan: Extubation in OR  Informed Consent: I have reviewed the patients History and Physical, chart, labs and discussed the procedure including the risks, benefits and alternatives for the proposed anesthesia with the patient or authorized representative who has indicated his/her understanding and acceptance.   Dental advisory given  Plan Discussed with: CRNA  Anesthesia Plan Comments:        Anesthesia Quick Evaluation

## 2018-08-25 NOTE — Interval H&P Note (Signed)
History and Physical Interval Note:  08/25/2018 9:01 AM  Andrew West  has presented today for surgery, with the diagnosis of Lung mass  The various methods of treatment have been discussed with the patient and family. After consideration of risks, benefits and other options for treatment, the patient has consented to  Procedure(s): ENDOBRONCHIAL ULTRASOUND (Bilateral) as a surgical intervention .  The patient's history has been reviewed, patient examined, no change in status, stable for surgery.  I have reviewed the patient's chart and labs.  Questions were answered to the patient's satisfaction.    Discussed plans with the patient regarding EBUS bronchoscopy. The patient understands the risks, benefits and alternatives. The patient has held his plavix for the past 5 days. There are no barriers to proceed.   Garner Nash, DO [facility] 08/25/2018 9:03 AM  Personal pager: [phone] If unanswered, please page CCM On-call: [phone]

## 2018-08-25 NOTE — Op Note (Signed)
Video Bronchoscopy with Endobronchial Ultrasound Procedure Note  Date of Operation: 08/25/2018  Pre-op Diagnosis: Right Paratracheal Mass, Mediastinal, Hilar Adenopathy   Post-op Diagnosis: Right Paratracheal Mass, Mediastinal, Hilar Adenopathy, RLL endobronchial lesion   Surgeon: Garner Nash, DO   Assistants: None   Anesthesia: General endotracheal anesthesia  Operation: Flexible video fiberoptic bronchoscopy with endobronchial ultrasound and biopsies.  Estimated Blood Loss: Minimal, <2NO   Complications: None   Indications and History: Andrew West is a 52 y.o. male with a large paratracheal mass, mediastinal adenopathy, hilar adenopathy.  The risks, benefits, complications, treatment options and expected outcomes were discussed with the patient.  The possibilities of pneumothorax, pneumonia, reaction to medication, pulmonary aspiration, perforation of a viscus, bleeding, failure to diagnose a condition and creating a complication requiring transfusion or operation were discussed with the patient who freely signed the consent.    Description of Procedure: The patient was examined in the preoperative area and history and data from the preprocedure consultation were reviewed. It was deemed appropriate to proceed.  The patient was taken to [facility] Endo Room 1, identified as Andrew West and the procedure verified as Flexible Video Fiberoptic Bronchoscopy.  A Time Out was held and the above information confirmed. After being taken to the operating room general anesthesia was initiated and the patient  was orally intubated. The video fiberoptic bronchoscope was introduced via the endotracheal tube and a general inspection was performed which showed normal bilateral lung anatomy with distal bronchiectatic openings along with a small 28mm endobronchial lesion found along the RLL bronchial mucosa. The standard scope was then withdrawn and the endobronchial ultrasound was used to identify and  characterize the peritracheal, hilar and bronchial lymph nodes. Inspection showed enlarged 4+ cm in largest cross section right paratracheal mass/4R tissue, enlarged station 11R and station 7. Using real-time ultrasound guidance TBNA needle biopsies were take from Station 11R, 4R/paratracheal mass, and 7 nodes and were sent for cytology. Following the TBNA biopsie we switched back to the forward viewing scope and used 2.0 boston forceps to remove the small 70mm endobronchial lesion to be sent for surgical pathology. The patient tolerated the procedure well without apparent complications. There was no significant blood loss. The bronchoscope was withdrawn. Anesthesia was reversed and the patient was taken to the PACU for recovery.   Samples: 1. 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 TBNA Station 4R/paratracheal mass, X4 for cell block  3. TBNA Station 7, X4 for cell block  4. Forcep biopsy, RLL endobronchial lesion, surgical pathology   Plans:  The patient will be discharged from the PACU to home when recovered from anesthesia. We will review the cytology, pathology and microbiology results with the patient when they become available. Outpatient followup will be with Leory Plowman L Jaklyn Alen, DO in 4 weeks.   1. Brain MRI w/wo Contrast to complete staging  2. Referral to medical and radiation oncology  3. Will forward chart to Norton Blizzard or thoracic oncology coordinator.   [facility] 08/25/2018 11:30 AM  Personal pager: [phone] If unanswered, please page CCM On-call: [phone]

## 2018-08-25 NOTE — Anesthesia Postprocedure Evaluation (Signed)
Anesthesia Post Note  Patient: Andrew West  Procedure(s) Performed: ENDOBRONCHIAL ULTRASOUND (Bilateral ) FINE NEEDLE ASPIRATION     Patient location during evaluation: PACU Anesthesia Type: General Level of consciousness: awake and alert, oriented and awake Pain management: pain level controlled Vital Signs Assessment: post-procedure vital signs reviewed and stable Respiratory status: spontaneous breathing, nonlabored ventilation and respiratory function stable Cardiovascular status: blood pressure returned to baseline and stable Postop Assessment: no apparent nausea or vomiting Anesthetic complications: no    Last Vitals:  Vitals:   08/25/18 1130 08/25/18 1135  BP: 118/68   Pulse: 79 75  Resp: (!) 23 (!) 26  Temp:    SpO2: 100% 100%    Last Pain:  Vitals:   08/25/18 1130  TempSrc:   PainSc: 0-No pain                 Catalina Gravel

## 2018-08-25 NOTE — Progress Notes (Signed)
Video bronchoscopy performed along with EBUS procedure Intervention bronchial biopsy performed  Kathie Dike RRT

## 2018-08-28 ENCOUNTER — Encounter (HOSPITAL_COMMUNITY): Payer: Self-pay | Admitting: Pulmonary Disease

## 2018-08-28 MED FILL — ACETAMINOPHEN/COD #3 TABLET: 300-30 | 2 days supply | Qty: 4 | Fill #4

## 2018-08-28 NOTE — Progress Notes (Signed)
Thoracic Location of Tumor / Histology: Mediastinal Mass  Patient presented with progressive shortness of breath and left sided intermittent chest pains. He was evaluated in the ED for chest pain.  MRI Brain ordered  PET 08/21/2018: 4.2 x 3.3 cm right paratracheal mass is markedly hypermetabolic sith SUV max = 33.2.  A right suprahilar mass measuring 2.5 x 3.3 cm is also hypermetabolic with SUZ max = 95.1.  No hypermetabolic axillary lymphadenopathy.  CT Chest 07/31/2018: 3.4 cm paratracheal mass as well as enlarged mediastinal and hilar adenopathy.  Biopsies of   Tobacco/Marijuana/Snuff/ETOH use: Former smoker, quit 2017  Past/Anticipated interventions by Pulmonary, if any: Dr. Valeta Harms 08/17/2018 -Risks versus benefits versus alternatives were discussed with the patient regarding tissue sampling. -After discussion will plan for recommended outpatient bronchoscopy with endobronchial ultrasound-guided transbronchial needle aspiration biopsies. -Looking for bronchoscopy to be scheduled either Thursday or Friday of next week at [facility] endoscopy. -EBUS performed on 08/25/2018 -Brain MRI to complete staging -referral to medical and radiation oncology   Past/Anticipated interventions by cardiothoracic surgery, if any:   Past/Anticipated interventions by medical oncology, if any:    Signs/Symptoms  Weight changes, if any: lost a couple pounds  Respiratory complaints, if any: SOB little- sometimes all the time.   Hemoptysis, if any: Little, non productive cough.  Pain issues, if any:  Center chest pain  BP (!) 145/95 (BP Location: Left Arm, Patient Position: Sitting)   Pulse 61   Temp 98.3 F (36.8 C) (Oral)   Resp 18   Ht 5\' 9"  (1.753 m)   Wt 243 lb 6 oz (110.4 kg)   SpO2 97%   BMI 35.94 kg/m    Wt Readings from Last 3 Encounters:  08/29/18 243 lb 6 oz (110.4 kg)  08/25/18 241 lb (109.3 kg)  08/23/18 240 lb 9.6 oz (109.1 kg)    SAFETY ISSUES:  Prior radiation?  No  Pacemaker/ICD? No  Possible current pregnancy? No  Is the patient on methotrexate? No  Current Complaints / other details:

## 2018-08-29 ENCOUNTER — Encounter: Payer: Self-pay | Admitting: *Deleted

## 2018-08-29 ENCOUNTER — Encounter: Payer: Self-pay | Admitting: Radiation Oncology

## 2018-08-29 ENCOUNTER — Other Ambulatory Visit: Payer: Self-pay

## 2018-08-29 ENCOUNTER — Other Ambulatory Visit: Payer: Self-pay | Admitting: Pharmacist

## 2018-08-29 ENCOUNTER — Ambulatory Visit
Admission: RE | Admit: 2018-08-29 | Discharge: 2018-08-29 | Disposition: A | Discharge status: Home / Self Care | Payer: Medicare Other | Source: Ambulatory Visit | Attending: Radiation Oncology | Admitting: Radiation Oncology

## 2018-08-29 VITALS — BP 145/95 | HR 61 | Temp 98.3°F | Resp 18 | Ht 69.0 in | Wt 243.4 lb

## 2018-08-29 DIAGNOSIS — E119 Type 2 diabetes mellitus without complications: Secondary | ICD-10-CM | POA: Diagnosis not present

## 2018-08-29 DIAGNOSIS — C3411 Malignant neoplasm of upper lobe, right bronchus or lung: Secondary | ICD-10-CM | POA: Insufficient documentation

## 2018-08-29 DIAGNOSIS — Z79899 Other long term (current) drug therapy: Secondary | ICD-10-CM | POA: Insufficient documentation

## 2018-08-29 DIAGNOSIS — G893 Neoplasm related pain (acute) (chronic): Secondary | ICD-10-CM | POA: Diagnosis not present

## 2018-08-29 DIAGNOSIS — Z7984 Long term (current) use of oral hypoglycemic drugs: Secondary | ICD-10-CM | POA: Diagnosis not present

## 2018-08-29 DIAGNOSIS — J449 Chronic obstructive pulmonary disease, unspecified: Secondary | ICD-10-CM | POA: Diagnosis not present

## 2018-08-29 DIAGNOSIS — C383 Malignant neoplasm of mediastinum, part unspecified: Secondary | ICD-10-CM

## 2018-08-29 DIAGNOSIS — F17210 Nicotine dependence, cigarettes, uncomplicated: Secondary | ICD-10-CM | POA: Insufficient documentation

## 2018-08-29 DIAGNOSIS — Z955 Presence of coronary angioplasty implant and graft: Secondary | ICD-10-CM | POA: Insufficient documentation

## 2018-08-29 DIAGNOSIS — R1310 Dysphagia, unspecified: Secondary | ICD-10-CM | POA: Insufficient documentation

## 2018-08-29 DIAGNOSIS — G8929 Other chronic pain: Secondary | ICD-10-CM | POA: Insufficient documentation

## 2018-08-29 DIAGNOSIS — I10 Essential (primary) hypertension: Secondary | ICD-10-CM | POA: Diagnosis not present

## 2018-08-29 MED ORDER — ALBUTEROL SULFATE HFA 108 (90 BASE) MCG/ACT IN AERS: 1.0000 | INHALATION_SPRAY | Freq: Four times a day (QID) | RESPIRATORY_TRACT | 2 refills | Status: DC | PRN

## 2018-08-29 MED ORDER — HYDROCODONE-ACETAMINOPHEN 7.5-325 MG/15ML PO SOLN: 10.0000 mL | Freq: Four times a day (QID) | ORAL | 0 refills | Status: DC | PRN

## 2018-08-29 MED FILL — HYDROCOD-APAP 7.5-325/15ML: 7.5-325 | 7 days supply | Qty: 473 | Fill #0

## 2018-08-29 NOTE — Progress Notes (Signed)
Oncology Nurse Navigator Documentation  Oncology Nurse Navigator Flowsheets 08/29/2018  Navigator Location [facility]  Referral date to RadOnc/MedOnc 08/29/2018  Navigator Encounter Type Initial RadOnc/spoke with patient today during his rad onc appt.  Gave and explained appt with Dr. Julien Nordmann.  He is struggling with his diagnosis and I offered assistance to help discuss.  He wanted some time before talking more.   Patient Visit Type RadOnc  Treatment Phase Pre-Tx/Tx Discussion  Barriers/Navigation Needs Coordination of Care  Interventions Coordination of Care  Coordination of Care Appts  Acuity Level 2  Time Spent with Patient 30

## 2018-08-29 NOTE — Progress Notes (Addendum)
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   HISTORY OF PRESENT ILLNESS: Andrew West is a 52 y.o. male seen at the request of Dr. Valeta Harms for a new diagnosis of right lung cancer. The patient has complained of right sided chest pain. He has a cardiac history of CAD and was seen for evaluation with a CXR on 07/04/18 that revealed a soft tissue density in the right lower paratracheal region. He was to have follow up and on 07/25/18 repeat CXR revealed persistence of this soft tissue change. A CT chest without contrast on 07/31/18 revealed mediastinal adenopathy along the right paratracheal region measuring 3.4 cm, with fullness int he right hilum, and subcarinal adenopathy measuring 2.4 cm. A few smaller prevascular nodes were seen as well. The soft tissue near the paratracheal node on prior imaging measured 1. 6 cm as well. A PET scan on 08/21/18 revealed hypermetabolsim in the right paratracheal mass measuring 4.2 x 3.3 cm, and SUV was 16.8. Right suprahilar mass measured 2.5 x 3.3 cm also with an SUV of 15. No other hypermetabolic disease in the mediastinum was noted, and there were hypermetabolic right level II nodes with an SUV of 5.3. No evidence of disease elsewhere was seen. He underwent a bronchoscopy on 08/25/18 with Dr. 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 He comes today to see Dr. Lisbeth Renshaw regarding options of radiotherapy. He is not yet scheduled for brain MRI. He sees Dr. Julien Nordmann later this week.   PREVIOUS RADIATION THERAPY: No   PAST  MEDICAL HISTORY:  Past Medical History:  Diagnosis Date  . Anxiety   . CAD (coronary artery disease)    a. s/p multiple PCIs with last cath 11/2016 with severe multivessel CAD, s/p PCTA to LCx but unable to pass stent  . Chronic leg pain    bilateral  . Chronic lower back pain   . COPD (chronic obstructive pulmonary disease) ([place])   . Depression   . GERD (gastroesophageal reflux disease)    Takes Dexilant  . HLD (hyperlipidemia)   . Hypertension   . Rhabdomyolysis    h/o, r/t statins  . Sleep apnea    "can't tolerate mask" (12/16/2016)  . Type II diabetes mellitus ([place])        PAST SURGICAL HISTORY: Past Surgical History:  Procedure Laterality Date  . BACK SURGERY    . BRONCHIAL BIOPSY  08/25/2018   Procedure: BRONCHIAL BIOPSIES;  Surgeon: Garner Nash, DO;  [facility];  Service: Cardiopulmonary;;  . CARDIAC CATHETERIZATION N/A 09/25/2015   Procedure: Left Heart Cath and Coronary Angiography;  Surgeon: Leonie Man, MD;  Location: [place] [facility];  Service: Cardiovascular;  Laterality: N/A;  . CARDIAC CATHETERIZATION N/A 12/16/2016   Procedure: Left Heart Cath and Coronary Angiography;  Surgeon: Leonie Man, MD;  Location: [place] CV LAB;  Service: Cardiovascular;  Laterality: N/A;  . CARDIAC CATHETERIZATION N/A 12/16/2016   Procedure: Coronary Balloon Angioplasty;  Surgeon: Leonie Man, MD;  Location: [place] CV LAB;  Service: Cardiovascular;  Laterality: N/A;  . COLONOSCOPY W/ POLYPECTOMY    . CORONARY ANGIOPLASTY  09/25/2015   mid cir & om  . CORONARY ANGIOPLASTY WITH STENT PLACEMENT  10/09/2001   PTCA & stenting of mid AV circumflex; 2.5x45m Pixel stent  . CORONARY ANGIOPLASTY WITH STENT PLACEMENT  12/13/2001   PCI with stent to mid L circumflex, 95% stenosis to 0% residual  . CORONARY ANGIOPLASTY WITH STENT PLACEMENT  10/10/2003   PCI to mid AV circumflex; LAD 30% disease; RCA 100% occluded prox.  . CORONARY ANGIOPLASTY WITH STENT  PLACEMENT  09/01/2011   PCI with stenting with bare metal stent to mid AV groove circumflex and PDA  . CORONARY ANGIOPLASTY WITH STENT PLACEMENT  10/17/2011   cutting balloon angioplasty of ostial lateral OM1 branch and bifurcation AV groove circumflex OM junction; stenosis reduced to 0%  . ENDOBRONCHIAL ULTRASOUND Bilateral 08/25/2018   Procedure: ENDOBRONCHIAL ULTRASOUND;  Surgeon: IGarner Nash DO;  [facility];  Service: Cardiopulmonary;  Laterality: Bilateral;  . EXCISIONAL HEMORRHOIDECTOMY    . FINE NEEDLE ASPIRATION  08/25/2018   Procedure: FINE NEEDLE ASPIRATION;  Surgeon: IGarner Nash DO;  [facility];  Service: Cardiopulmonary;;  . FLEXIBLE BRONCHOSCOPY  08/25/2018   Procedure: FLEXIBLE BRONCHOSCOPY;  Surgeon: IGarner Nash DO;  [facility];  Service: Cardiopulmonary;;  . LEFT HEART CATHETERIZATION WITH CORONARY ANGIOGRAM N/A 10/18/2011   Procedure: LEFT HEART CATHETERIZATION WITH CORONARY ANGIOGRAM;  Surgeon: DLeonie Man MD;  [facility];  Service: Cardiovascular;  Laterality: N/A;  . LUMBAR LAMINECTOMY/DECOMPRESSION MICRODISCECTOMY  03/31/2012   Procedure: LUMBAR LAMINECTOMY/DECOMPRESSION MICRODISCECTOMY 1 LEVEL;  Surgeon: HCharlie Pitter MD;  [facility];  Service: Neurosurgery;  Laterality: Left;  . TRANSTHORACIC ECHOCARDIOGRAM  07/28/2011   EF 55-65%; LVH, grade 1 diastolic dysfunction;      FAMILY HISTORY:  Family History  Problem Relation Age of Onset  . Heart attack Father   . Hypertension Mother   . Diabetes Mother   . Heart disease Brother        x 3   . Heart attack Brother        deceased  . Hypertension Sister   . Diabetes Sister   . Anesthesia problems Neg Hx   . Hypotension Neg Hx   . Malignant hyperthermia Neg Hx   . Pseudochol deficiency Neg Hx      SOCIAL HISTORY:  reports that he has been smoking cigarettes. He has a 6.25 pack-year smoking history. He has never used smokeless tobacco.  He reports that he does not drink alcohol or use drugs.   ALLERGIES: Iohexol   MEDICATIONS:  Current Outpatient Medications  Medication Sig Dispense Refill  . Albuterol Sulfate (PROAIR RESPICLICK) 1876(90 Base) MCG/ACT AEPB Inhale 1 puff into the lungs every 6 (six) hours as needed (shortness of breath). 3 each 3  . amLODipine (NORVASC) 10 MG tablet Take 1 tablet (10 mg total) by mouth daily. 90 tablet 1  . aspirin 81 MG tablet Take 1 tablet (81 mg total) daily by mouth. 30 tablet   . baclofen (LIORESAL) 10 MG tablet Take 1 tablet (10 mg total) by mouth 3 (three) times daily. 90 each 1  . benzonatate (TESSALON) 100 MG capsule Take 1 capsule (100 mg total) by mouth every 8 (eight) hours. 21 capsule 0  . Blood Glucose Monitoring Suppl (ACCU-CHEK AVIVA PLUS) w/Device KIT Use as dircted 1 kit 0  .  cetirizine (ZYRTEC) 10 MG tablet Take 1 tablet (10 mg total) daily by mouth. 30 tablet 1  . Cholecalciferol (VITAMIN D) 2000 units tablet Take 1 tablet (2,000 Units total) by mouth daily. 30 tablet 1  . clopidogrel (PLAVIX) 75 MG tablet Take 1 tablet (75 mg total) by mouth daily. YOU MAY RESTART 08/26/2018 90 tablet 1  . diclofenac sodium (VOLTAREN) 1 % GEL Apply 2 g topically 4 (four) times daily. 1 Tube 0  . FLUoxetine (PROZAC) 40 MG capsule Take 1 capsule (40 mg total) by mouth daily. 90 capsule 1  . fluticasone (FLONASE) 50 MCG/ACT nasal spray Place 2 sprays into both nostrils daily. (Patient taking differently: Place 2 sprays into both nostrils daily as needed for allergies. ) 16 g 6  . Fluticasone-Salmeterol (ADVAIR) 100-50 MCG/DOSE AEPB Inhale 1 puff into the lungs 2 (two) times daily. 1 each 3  . furosemide (LASIX) 20 MG tablet Take 20 mg by mouth as needed.     Marland Kitchen glucose blood (ACCU-CHEK AVIVA) test strip Use as instructed 100 each 12  . hydrALAZINE (APRESOLINE) 25 MG tablet Take 1 tablet (25 mg total) by mouth 2 (two) times daily. 180 tablet 1  . hydrocortisone-pramoxine (ANALPRAM-HC) 2.5-1 %  rectal cream PLACE 1 APPLICATION RECTALLY 3 (THREE) TIMES DAILY. (Patient taking differently: Place 1 application rectally as needed. ) 30 g 1  . isosorbide mononitrate (IMDUR) 120 MG 24 hr tablet Take 1 tablet (120 mg total) by mouth daily. (Patient taking differently: Take 60 mg by mouth 2 (two) times daily. ) 90 tablet 1  . losartan-hydrochlorothiazide (HYZAAR) 100-25 MG tablet Take 1 tablet by mouth daily. 90 tablet 1  . metFORMIN (GLUCOPHAGE) 1000 MG tablet Take 1 tablet (1,000 mg total) by mouth 2 (two) times daily with a meal. 180 tablet 1  . Nebivolol HCl 20 MG TABS Take 1 tablet (20 mg total) by mouth daily. 90 tablet 3  . pantoprazole (PROTONIX) 40 MG tablet Take 1 tablet (40 mg total) by mouth 2 (two) times daily. 180 tablet 1  . prazosin (MINIPRESS) 1 MG capsule Take 1 capsule (1 mg total) by mouth at bedtime. For nightmares 30 capsule 2  . ranolazine (RANEXA) 1000 MG SR tablet Take 1 tablet (1,000 mg total) by mouth 2 (two) times daily. (Patient taking differently: Take 1,000 mg by mouth daily. ) 180 tablet 3  . rosuvastatin (CRESTOR) 20 MG tablet Take 1 tablet (20 mg total) by mouth daily. 90 tablet 1  . tamsulosin (FLOMAX) 0.4 MG CAPS capsule Take 1 capsule (0.4 mg total) by mouth daily. 30 capsule 5  . tiotropium (SPIRIVA HANDIHALER) 18 MCG inhalation capsule Place 1 capsule (18 mcg total) into inhaler and inhale daily. 90 capsule 1  . traZODone (DESYREL) 50 MG tablet Take 1 tablet (50 mg total) by mouth at bedtime as needed. for sleep 30 tablet 5  . vitamin B-12 (CYANOCOBALAMIN) 500 MCG tablet Take 1 tablet (500 mcg total) by mouth daily. 30 tablet 1  . acetaminophen-codeine (TYLENOL #3) 300-30 MG tablet Take 1 tablet by mouth every 12 (twelve) hours as needed for moderate pain. (Patient not taking: Reported on 08/29/2018) 60 tablet 0  . HYDROcodone-acetaminophen (HYCET) 7.5-325 mg/15 ml solution Take 10-15 mLs by mouth every 6 (six) hours as needed for moderate pain. 473 mL 0  .  nitroGLYCERIN (NITROSTAT) 0.4 MG SL tablet Place 1 tablet (0.4 mg total) under the tongue every 5 (five) minutes as needed for chest pain. (Patient not taking: Reported on  08/29/2018) 25 tablet 2   No current facility-administered medications for this encounter.      REVIEW OF SYSTEMS: On review of systems, the patient reports that he is doing okay but is very anxious and reports he has significant pain non responsive to tylenol. He describes pain when swallowing as well as some mild hoarseness that's developed over the last few months. He's had hydrocodone previously but was only given a small amount of this. He denies any chest pain, shortness of breath, cough, fevers, chills, night sweats. He's had about 5-6 pounds of unintentional weight loss. He denies any bowel or bladder disturbances, and denies abdominal pain, nausea or vomiting. He denies any new musculoskeletal or joint aches or pains. A complete review of systems is obtained and is otherwise negative.   PHYSICAL EXAM:  Wt Readings from Last 3 Encounters:  08/29/18 243 lb 6 oz (110.4 kg)  08/25/18 241 lb (109.3 kg)  08/23/18 240 lb 9.6 oz (109.1 kg)   Temp Readings from Last 3 Encounters:  08/29/18 98.3 F (36.8 C) (Oral)  08/25/18 98.1 F (36.7 C) (Oral)  08/21/18 97.8 F (36.6 C) (Oral)   BP Readings from Last 3 Encounters:  08/29/18 (!) 145/95  08/25/18 121/76  08/23/18 129/90   Pulse Readings from Last 3 Encounters:  08/29/18 61  08/25/18 74  08/23/18 71   Pain Assessment Pain Score: 5  Pain Loc: Chest/10  In general this is a well appearing African American male in no acute distress. He is alert and oriented x4 and appropriate throughout the examination. He is anxious and paces the room and leaves and comes back in the midst of discussion while his 4 other family members stayed in the room. HEENT reveals that the patient is normocephalic, atraumatic. EOMs are intact.  Skin is intact without any evidence of gross  lesions. Cardiovascular exam reveals a regular rate and rhythm, no clicks rubs or murmurs are auscultated. Chest is clear to auscultation bilaterally. Lymphatic assessment is performed and does not reveal any adenopathy in the cervical, supraclavicular, axillary chains. Abdomen has active bowel sounds in all quadrants and is intact. The abdomen is soft, non tender, non distended. Lower extremities are negative for pretibial pitting edema, deep calf tenderness, cyanosis or clubbing.   ECOG = 1  0 - Asymptomatic (Fully active, able to carry on all predisease activities without restriction)  1 - Symptomatic but completely ambulatory (Restricted in physically strenuous activity but ambulatory and able to carry out work of a light or sedentary nature. For example, light housework, office work)  2 - Symptomatic, <50% in bed during the day (Ambulatory and capable of all self care but unable to carry out any work activities. Up and about more than 50% of waking hours)  3 - Symptomatic, >50% in bed, but not bedbound (Capable of only limited self-care, confined to bed or chair 50% or more of waking hours)  4 - Bedbound (Completely disabled. Cannot carry on any self-care. Totally confined to bed or chair)  5 - Death   Eustace Pen MM, Creech RH, Tormey DC, et al. [phone]). "Toxicity and response criteria of the [facility]". [place] Oncol. 5 (6): 649-55    LABORATORY DATA:  Lab Results  Component Value Date   WBC 7.3 08/17/2018   HGB 13.8 08/17/2018   HCT 41.4 08/17/2018   MCV 80.6 08/17/2018   PLT 220.0 08/17/2018   Lab Results  Component Value Date   NA 139 08/17/2018  K 3.7 08/17/2018   CL 101 08/17/2018   CO2 31 08/17/2018   Lab Results  Component Value Date   ALT 17 07/25/2018   AST 16 07/25/2018   ALKPHOS 66 07/25/2018   BILITOT 0.2 07/25/2018      RADIOGRAPHY: Dg Chest 2 View  Result Date: 08/08/2018 CLINICAL DATA:  Worsening pain for 2 days. Follow-up  lymphadenopathy and upper lobe mass. EXAM: CHEST - 2 VIEW COMPARISON:  CT chest July 31, 2018 FINDINGS: Fullness of the RIGHT paratracheal stripe and RIGHT hilum corresponding to lymphadenopathy on recent CT. No pleural effusion or focal consolidation. No pneumothorax. Soft tissue planes and included osseous structures are unchanged. Faint calcifications RIGHT neck are likely vascular. IMPRESSION: 1. No acute cardiopulmonary process. 2. lymphadenopathy better demonstrated on recent CT. Aortic Atherosclerosis (ICD10-I70.0). Electronically Signed   By: Elon Alas M.D.   On: 08/08/2018 20:25   Ct Chest Wo Contrast  Result Date: 08/01/2018 CLINICAL DATA:  Persistent right paratracheal mass on recent chest x-ray EXAM: CT CHEST WITHOUT CONTRAST TECHNIQUE: Multidetector CT imaging of the chest was performed following the standard protocol without IV contrast. COMPARISON:  Chest x-ray from 07/25/2018, CT of the chest from 07/05/2016 FINDINGS: Cardiovascular: Somewhat limited due to lack of IV contrast due to contrast allergy. Aortic atherosclerotic changes are noted without aneurysmal dilatation. Heavy coronary calcifications are seen. No cardiac enlargement is noted. Mediastinum/Nodes: The thoracic inlet shows the thyroid gland to be mildly prominent without definitive nodule. No significant lymphadenopathy is identified. There is significant mediastinal adenopathy identified in the right paratracheal region which measures 3.4 cm in short axis. There is some suggestion of fullness in the right hilum as well although the lack of IV contrast somewhat limits evaluation. No definitive left hilar adenopathy is seen. The esophagus as visualized is within normal limits. Fullness in the subcarinal region is noted as well measuring 2.4 cm in short axis consistent with lymphadenopathy. Few scattered smaller prevascular lymph nodes are noted the largest of which is seen on image number 51 of series 2. Lungs/Pleura: The  lungs are well aerated bilaterally and demonstrate mild emphysematous changes. No focal infiltrate or sizable effusion is seen. An area of soft tissue fullness is noted adjacent to the right paratracheal adenopathy best seen on image number 44 of series 5. This measures 1.6 cm in greatest dimension and may represent a parenchymal nodule adjacent to the mediastinum within the right upper lobe. Upper Abdomen: Visualized upper abdomen is within normal limits. Musculoskeletal: Degenerative changes of the thoracic spine are noted. No acute bony abnormality is noted. IMPRESSION: Mediastinal lymphadenopathy as well as suggestion of a focal soft tissue mass lesion within the right upper lobe. Further evaluation by means of PET-CT imaging is recommended. Additionally endoscopic guided biopsy is recommended for tissue sampling. These results will be called to the ordering clinician or representative by the Radiologist Assistant, and communication documented in the PACS or zVision Dashboard. Aortic Atherosclerosis (ICD10-I70.0) and Emphysema (ICD10-J43.9). Electronically Signed   By: Inez Catalina M.D.   On: 08/01/2018 07:55   Nm Pet Image Initial (pi) Skull Base To Thigh  Result Date: 08/21/2018 CLINICAL DATA:  Initial treatment strategy for right upper lobe mass. EXAM: NUCLEAR MEDICINE PET SKULL BASE TO THIGH TECHNIQUE: 12.3 mCi F-18 FDG was injected intravenously. Full-ring PET imaging was performed from the skull base to thigh after the radiotracer. CT data was obtained and used for attenuation correction and anatomic localization. Fasting blood glucose: 101 mg/dl COMPARISON:  Chest CT 07/31/2018  FINDINGS: Mediastinal blood pool activity: SUV max 3.2 NECK: Hypermetabolic right level II lymph nodes identified with SUV max = 5.3. No hypermetabolic lymphadenopathy in the left neck. Incidental CT findings: none CHEST: 4.2 x 3.3 cm right paratracheal mass is markedly hypermetabolic with SUV max = 81.7. A right suprahilar  mass measuring 2.5 x 3.3 cm is also hypermetabolic with SUV max = 71.1. No other hypermetabolic disease in the mediastinum or left hilum. No hypermetabolic axillary lymphadenopathy. Incidental CT findings: Dependent atelectasis noted in the lower lungs. Centrilobular and paraseptal emphysema evident. ABDOMEN/PELVIS: No abnormal hypermetabolic activity within the liver, pancreas, adrenal glands, or spleen. No hypermetabolic lymph nodes in the abdomen or pelvis. Incidental CT findings: Small right groin hernia contains only fat. Atherosclerotic calcification noted distal abdominal aorta. SKELETON: No focal hypermetabolic activity to suggest skeletal metastasis. Incidental CT findings: none IMPRESSION: 1. Markedly hypermetabolic right paratracheal and right suprahilar lesions, compatible with malignancy. 2. Small hypermetabolic right cervical level II lymph nodes, suspicious for metastatic disease. 3. No evidence for hypermetabolic disease in the abdomen or pelvis. Electronically Signed   By: Misty Stanley M.D.   On: 08/21/2018 10:00       IMPRESSION/PLAN: 1. 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 Dr. Lisbeth Renshaw discusses the pathology findings and reviews the nature of small cell carcinoma of the lung. Based on his imaging to date, he would likely be a candidate for chemoRT to the chest. We will wait on his MRI to confirm this. We discussed the risks, benefits, short, and long term effects of radiotherapy, and the patient is interested in proceeding. Dr. Lisbeth Renshaw discusses the delivery and logistics of radiotherapy and anticipates a course of 6 1/2 weeks of radiotherapy. Written consent is obtained and placed in the chart, a copy was provided to the patient. He will simulate on Thursday when he comes in to see Dr. Julien Nordmann with the anticipation of starting treatment 09/11/18. He is aware though that if he has brain disease, this could change the discussion and goals of therapy.  2. PCI. We  discussed the risks of developing brain disease from small cell lung cancer. He is aware of the role for prophylactic cranial irradiation and the rationale for risk reduction of brain disease with PCI. We will discuss this further and plan for treatment as clinically indicated by subsequent MRI imaging. 3. Pain secondary to #1. The patient is having dysphagia. Rather than pain pills, he will likely develop esophagitis due to radiotherapy in the chest and I sent in a prescription to [facility] for Hycet. We will follow this expectantly.   The above documentation reflects my direct findings during this shared patient visit. Please see the separate note by Dr. Lisbeth Renshaw on this date for the remainder of the patient's plan of care.    Carola Rhine, PAC

## 2018-08-30 ENCOUNTER — Other Ambulatory Visit: Payer: Self-pay | Admitting: *Deleted

## 2018-08-30 ENCOUNTER — Telehealth: Payer: Self-pay | Admitting: *Deleted

## 2018-08-30 ENCOUNTER — Other Ambulatory Visit: Payer: Self-pay | Admitting: Radiation Oncology

## 2018-08-30 ENCOUNTER — Ambulatory Visit (HOSPITAL_COMMUNITY)
Admission: RE | Admit: 2018-08-30 | Discharge: 2018-08-30 | Disposition: A | Discharge status: Home / Self Care | Payer: Medicare Other | Source: Ambulatory Visit | Attending: Radiation Oncology | Admitting: Radiation Oncology

## 2018-08-30 ENCOUNTER — Encounter: Payer: Self-pay | Admitting: General Practice

## 2018-08-30 DIAGNOSIS — C3411 Malignant neoplasm of upper lobe, right bronchus or lung: Secondary | ICD-10-CM

## 2018-08-30 DIAGNOSIS — C3490 Malignant neoplasm of unspecified part of unspecified bronchus or lung: Secondary | ICD-10-CM

## 2018-08-30 MED ORDER — LORAZEPAM 0.5 MG PO TABS: ORAL_TABLET | ORAL | 0 refills | Status: DC

## 2018-08-30 MED FILL — LORazepam 0.5 MG TABS: 0.5 | 5 days supply | Qty: 30 | Fill #0

## 2018-08-30 NOTE — Progress Notes (Signed)
[place] Psychosocial Distress Screening Clinical Social Work  Clinical Social Work was referred by distress screening protocol.  The patient scored a 5 on the Psychosocial Distress Thermometer which indicates moderate distress. Clinical Social Worker contacted patient by phone to assess for distress and other psychosocial needs. Discussed general issues surrounding diagnosis - is not working and on disability due to heart issues.  Lives alone but son and daughter check on him frequently.   Has friends who take him to appointments, but is struggling w lack of funds for gas money.  Scheduled for clinic tomorrow as well as CT Sim.  Does not like liquid pain medication prescribed to him - "it makes me sick, I do better w the pills."  Encouraged to bring this up w oncologist during clinic visit.  Agreeable to waiting til then, states he can manage pain until that time.  Will meet CSW Somers in [facility] as well for further resources and support.    ONCBCN DISTRESS SCREENING 08/29/2018  Screening Type Initial Screening  Distress experienced in past week (1-10) 5  Emotional problem type Nervousness/Anxiety;Adjusting to illness;Adjusting to appearance changes;Boredom  Physical Problem type Pain;Nausea/vomiting;Breathing;Loss of appetitie;Tingling hands/feet;Swollen arms/legs  Other Contact anytime.    Clinical Social Worker follow up needed: No.  If yes, follow up plan:  Beverely Pace, [place], LCSW Clinical Social Worker Phone:  [phone]

## 2018-08-30 NOTE — Telephone Encounter (Signed)
CALLED PATIENT TO INFORM OF MRI FOR 08-30-18- ARRIVAL TIME- 3:30 PM @ [facility], NO RESTRICTIONS TO TEST, SPOKE WITH PATIENT AND HE IS AWARE OF THIS TEST

## 2018-08-30 NOTE — Progress Notes (Signed)
Pt too claustrophobic to have MRI. Per pt. MD called in meds. PT unsure where meds would have been called into. It is not possible for [place] to give outpt's medicine for thier MRI. Pt going to call MD and follow up. Pt given number to call back for [place] to get him back on the schedule once meds are properly ordered.

## 2018-08-31 ENCOUNTER — Inpatient Hospital Stay: Payer: Medicare Other | Attending: Internal Medicine | Admitting: Internal Medicine

## 2018-08-31 ENCOUNTER — Telehealth: Payer: Self-pay | Admitting: Internal Medicine

## 2018-08-31 ENCOUNTER — Encounter: Payer: Self-pay | Admitting: *Deleted

## 2018-08-31 ENCOUNTER — Ambulatory Visit
Admission: RE | Admit: 2018-08-31 | Discharge: 2018-08-31 | Disposition: A | Discharge status: Home / Self Care | Payer: Medicare Other | Source: Ambulatory Visit | Attending: Radiation Oncology | Admitting: Radiation Oncology

## 2018-08-31 ENCOUNTER — Encounter: Payer: Self-pay | Admitting: Internal Medicine

## 2018-08-31 ENCOUNTER — Inpatient Hospital Stay: Payer: Medicare Other

## 2018-08-31 VITALS — BP 162/105 | HR 64 | Temp 98.3°F | Resp 17 | Ht 69.0 in | Wt 245.1 lb

## 2018-08-31 DIAGNOSIS — Z720 Tobacco use: Secondary | ICD-10-CM | POA: Diagnosis not present

## 2018-08-31 DIAGNOSIS — J449 Chronic obstructive pulmonary disease, unspecified: Secondary | ICD-10-CM | POA: Diagnosis not present

## 2018-08-31 DIAGNOSIS — Z5111 Encounter for antineoplastic chemotherapy: Secondary | ICD-10-CM | POA: Insufficient documentation

## 2018-08-31 DIAGNOSIS — Z79899 Other long term (current) drug therapy: Secondary | ICD-10-CM | POA: Insufficient documentation

## 2018-08-31 DIAGNOSIS — Z7189 Other specified counseling: Secondary | ICD-10-CM | POA: Insufficient documentation

## 2018-08-31 DIAGNOSIS — F329 Major depressive disorder, single episode, unspecified: Secondary | ICD-10-CM | POA: Diagnosis not present

## 2018-08-31 DIAGNOSIS — Z7984 Long term (current) use of oral hypoglycemic drugs: Secondary | ICD-10-CM

## 2018-08-31 DIAGNOSIS — R0789 Other chest pain: Secondary | ICD-10-CM

## 2018-08-31 DIAGNOSIS — M25519 Pain in unspecified shoulder: Secondary | ICD-10-CM | POA: Diagnosis not present

## 2018-08-31 DIAGNOSIS — E119 Type 2 diabetes mellitus without complications: Secondary | ICD-10-CM | POA: Diagnosis not present

## 2018-08-31 DIAGNOSIS — Z716 Tobacco abuse counseling: Secondary | ICD-10-CM

## 2018-08-31 DIAGNOSIS — Z510 Encounter for antineoplastic radiation therapy: Secondary | ICD-10-CM | POA: Insufficient documentation

## 2018-08-31 DIAGNOSIS — I2510 Atherosclerotic heart disease of native coronary artery without angina pectoris: Secondary | ICD-10-CM

## 2018-08-31 DIAGNOSIS — F419 Anxiety disorder, unspecified: Secondary | ICD-10-CM

## 2018-08-31 DIAGNOSIS — R05 Cough: Secondary | ICD-10-CM

## 2018-08-31 DIAGNOSIS — F32A Depression, unspecified: Secondary | ICD-10-CM

## 2018-08-31 DIAGNOSIS — R0602 Shortness of breath: Secondary | ICD-10-CM | POA: Diagnosis not present

## 2018-08-31 DIAGNOSIS — C3411 Malignant neoplasm of upper lobe, right bronchus or lung: Secondary | ICD-10-CM | POA: Diagnosis not present

## 2018-08-31 DIAGNOSIS — I10 Essential (primary) hypertension: Secondary | ICD-10-CM | POA: Diagnosis not present

## 2018-08-31 LAB — CMP (CANCER CENTER ONLY)
ALT: 13 U/L (ref 0–44)
AST: 18 U/L (ref 15–41)
Albumin: 3.8 g/dL (ref 3.5–5.0)
Alkaline Phosphatase: 65 U/L (ref 38–126)
Anion gap: 9 (ref 5–15)
BUN: 9 mg/dL (ref 6–20)
CHLORIDE: 105 mmol/L (ref 98–111)
CO2: 28 mmol/L (ref 22–32)
Calcium: 8.9 mg/dL (ref 8.9–10.3)
Creatinine: 0.94 mg/dL (ref 0.61–1.24)
GFR, Est AFR Am: >60 mL/min (ref >60)
GFR, Estimated: >60 mL/min (ref >60)
GLUCOSE: 110 mg/dL — AB (ref 70–99)
POTASSIUM: 3.5 mmol/L (ref 3.5–5.1)
Sodium: 142 mmol/L (ref 135–145)
Total Bilirubin: 0.4 mg/dL (ref 0.3–1.2)
Total Protein: 7.1 g/dL (ref 6.5–8.1)

## 2018-08-31 LAB — CBC WITH DIFFERENTIAL (CANCER CENTER ONLY)
ABS IMMATURE GRANULOCYTES: 0.01 10*3/uL (ref 0.00–0.07)
Basophils Absolute: 0 10*3/uL (ref 0.0–0.1)
Basophils Relative: 0 %
Eosinophils Absolute: 0.1 10*3/uL (ref 0.0–0.5)
Eosinophils Relative: 2 %
HCT: 39.4 % (ref 39.0–52.0)
HEMOGLOBIN: 12.6 g/dL — AB (ref 13.0–17.0)
Immature Granulocytes: 0 %
LYMPHS ABS: 3 10*3/uL (ref 0.7–4.0)
LYMPHS PCT: 44 %
MCH: 26.5 pg (ref 26.0–34.0)
MCHC: 32 g/dL (ref 30.0–36.0)
MCV: 82.9 fL (ref 80.0–100.0)
MONO ABS: 0.8 10*3/uL (ref 0.1–1.0)
MONOS PCT: 12 %
NEUTROS ABS: 2.9 10*3/uL (ref 1.7–7.7)
Neutrophils Relative %: 42 %
Platelet Count: 200 10*3/uL (ref 150–400)
RBC: 4.75 MIL/uL (ref 4.22–5.81)
RDW: 13.6 % (ref 11.5–15.5)
WBC: 6.8 10*3/uL (ref 4.0–10.5)
nRBC: 0 % (ref 0.0–0.2)

## 2018-08-31 MED ORDER — NICOTINE 21 MG/24HR TD PT24: 21.0000 mg | MEDICATED_PATCH | Freq: Every day | TRANSDERMAL | 0 refills | Status: DC

## 2018-08-31 MED ORDER — PROCHLORPERAZINE MALEATE 10 MG PO TABS: 10.0000 mg | ORAL_TABLET | Freq: Four times a day (QID) | ORAL | 0 refills | Status: DC | PRN

## 2018-08-31 MED ORDER — MIRTAZAPINE 30 MG PO TABS: 30.0000 mg | ORAL_TABLET | Freq: Every day | ORAL | 2 refills | Status: DC

## 2018-08-31 MED FILL — MIRTAZAPINE 30 MG TABLET: 30 | 30 days supply | Qty: 30 | Fill #0

## 2018-08-31 MED FILL — PROCHLORPERAZINE 10 MG TAB: 10 | 7 days supply | Qty: 30 | Fill #0

## 2018-08-31 MED FILL — NICOTINE 21 MG/24HR PATCH: 21 | 28 days supply | Qty: 28 | Fill #0

## 2018-08-31 NOTE — Progress Notes (Signed)
Oncology Nurse Navigator Documentation  Oncology Nurse Navigator Flowsheets 08/31/2018  Navigator Location [facility]-  Navigator Encounter Type Clinic/MDC/I spoke with patient and family at clinic.  Gave and explained information on lung cancer, treatment, side effect, smoking cessation, and support services.    Abnormal Finding Date 07/31/2018  Confirmed Diagnosis Date 08/25/2018  Multidisiplinary Clinic Date 08/31/2018  Treatment Initiated Date 09/07/2018  Patient Visit Type MedOnc  Treatment Phase Pre-Tx/Tx Discussion  Barriers/Navigation Needs Education  Education Understanding Cancer/ Treatment Options;Newly Diagnosed Cancer Education;Smoking cessation;Other  Interventions Education  Education Method Verbal;Written  Acuity Level 2  Time Spent with Patient 30

## 2018-08-31 NOTE — Progress Notes (Signed)
[place] [phone]) [phone]   [phone] Multidisciplinary thoracic oncology clinic  CONSULT NOTE  REFERRING PHYSICIAN: Dr. Leory Plowman Icard  REASON FOR CONSULTATION:  52 years old African-American male recently diagnosed with lung cancer  HPI Andrew West is a 52 y.o. male with long history of smoking and past medical history significant for anxiety, coronary artery disease, COPD, GERD, depression, chronic low back pain, hypertension, dyslipidemia as well as diabetes mellitus and sleep apnea.  The patient presented to the emergency department on 08/08/2018 complaining of right-sided chest pain that has been going on for several months.  He was previously found on chest x-ray of the chest on 07/25/2018 to have persistent prominent soft tissue process over the right paratracheal region.  This was followed by CT scan of the chest without contrast on 07/31/2018 and that showed mediastinal lymphadenopathy as well as suggestion of a focal soft tissue mass lesion within the right upper lobe.  The patient had a PET scan on 08/21/2018 and that showed 4.2 x 3.3 cm right paratracheal mass markedly hypermetabolic with SUV max of 96.7.  There was a right suprahilar mass measuring 2.5 x 3.3 cm also hypermetabolic with SUV max of 59.1.  There was also hypermetabolic right level 2 lymph node with SUV max of 5.3.  The PET scan showed no evidence for hypermetabolic disease in the abdomen or pelvis.  The patient was seen by Dr. Valeta Harms and on 08/25/2018 he underwent flexible video fiberoptic bronchoscopy with endobronchial ultrasound and biopsies.  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 The patient was referred to the multidisciplinary thoracic oncology clinic today for evaluation and discussion of his treatment options. When seen today the patient is feeling fine except for the persistent  central chest pain as well as shortness of breath and dry cough with no hemoptysis.  He also has fatigue.  He denied having any nausea, vomiting, diarrhea or constipation.  He denied having any weight loss or night sweats.  He denied having any headache or visual changes. Family history significant for mother with hypertension and diabetes, father had hypertension, stroke and heart disease. The patient is married and has 7 children.  He was accompanied by his wife Andrew West and his mother Andrew West.  The patient used to work as a Librarian, academic at [employer].  He has a history of smoking less than 1 pack/day for around 4 years and unfortunately continues to smoke.  He has no history of alcohol or drug abuse.  HPI  Past Medical History:  Diagnosis Date  . Anxiety   . CAD (coronary artery disease)    a. s/p multiple PCIs with last cath 11/2016 with severe multivessel CAD, s/p PCTA to LCx but unable to pass stent  . Chronic leg pain    bilateral  . Chronic lower back pain   . COPD (chronic obstructive pulmonary disease) ([place])   . Depression   . GERD (gastroesophageal reflux disease)    Takes Dexilant  . HLD (hyperlipidemia)   . Hypertension   . Rhabdomyolysis    h/o, r/t statins  . Sleep apnea    "can't tolerate mask" (12/16/2016)  . Type II diabetes mellitus ([place])     Past Surgical History:  Procedure Laterality Date  . BACK SURGERY    . BRONCHIAL BIOPSY  08/25/2018   Procedure: BRONCHIAL BIOPSIES;  Surgeon: Garner Nash, DO;  Location: [facility];  Service: Cardiopulmonary;;  . CARDIAC CATHETERIZATION N/A 09/25/2015   Procedure: Left Heart Cath and Coronary Angiography;  Surgeon: Leonie Man, MD;  Location: [place] CV LAB;  Service: Cardiovascular;  Laterality: N/A;  . CARDIAC CATHETERIZATION N/A 12/16/2016   Procedure: Left Heart Cath and Coronary Angiography;  Surgeon: Leonie Man, MD;  Location: [place] CV LAB;  Service: Cardiovascular;  Laterality: N/A;  .  CARDIAC CATHETERIZATION N/A 12/16/2016   Procedure: Coronary Balloon Angioplasty;  Surgeon: Leonie Man, MD;  Location: [place] CV LAB;  Service: Cardiovascular;  Laterality: N/A;  . COLONOSCOPY W/ POLYPECTOMY    . CORONARY ANGIOPLASTY  09/25/2015   mid cir & om  . CORONARY ANGIOPLASTY WITH STENT PLACEMENT  10/09/2001   PTCA & stenting of mid AV circumflex; 2.5x31m Pixel stent  . CORONARY ANGIOPLASTY WITH STENT PLACEMENT  12/13/2001   PCI with stent to mid L circumflex, 95% stenosis to 0% residual  . CORONARY ANGIOPLASTY WITH STENT PLACEMENT  10/10/2003   PCI to mid AV circumflex; LAD 30% disease; RCA 100% occluded prox.  . CORONARY ANGIOPLASTY WITH STENT PLACEMENT  09/01/2011   PCI with stenting with bare metal stent to mid AV groove circumflex and PDA  . CORONARY ANGIOPLASTY WITH STENT PLACEMENT  10/17/2011   cutting balloon angioplasty of ostial lateral OM1 branch and bifurcation AV groove circumflex OM junction; stenosis reduced to 0%  . ENDOBRONCHIAL ULTRASOUND Bilateral 08/25/2018   Procedure: ENDOBRONCHIAL ULTRASOUND;  Surgeon: IGarner Nash DO;  [facility];  Service: Cardiopulmonary;  Laterality: Bilateral;  . EXCISIONAL HEMORRHOIDECTOMY    . FINE NEEDLE ASPIRATION  08/25/2018   Procedure: FINE NEEDLE ASPIRATION;  Surgeon: IGarner Nash DO;  [facility];  Service: Cardiopulmonary;;  . FLEXIBLE BRONCHOSCOPY  08/25/2018   Procedure: FLEXIBLE BRONCHOSCOPY;  Surgeon: IGarner Nash DO;  [facility];  Service: Cardiopulmonary;;  . LEFT HEART CATHETERIZATION WITH CORONARY ANGIOGRAM N/A 10/18/2011   Procedure: LEFT HEART CATHETERIZATION WITH CORONARY ANGIOGRAM;  Surgeon: DLeonie Man MD;  [facility];  Service: Cardiovascular;  Laterality: N/A;  . LUMBAR LAMINECTOMY/DECOMPRESSION MICRODISCECTOMY  03/31/2012   Procedure: LUMBAR LAMINECTOMY/DECOMPRESSION MICRODISCECTOMY 1 LEVEL;  Surgeon: HCharlie Pitter MD;  [facility];   Service: Neurosurgery;  Laterality: Left;  . TRANSTHORACIC ECHOCARDIOGRAM  07/28/2011   EF 55-65%; LVH, grade 1 diastolic dysfunction;     Family History  Problem Relation Age of Onset  . Heart attack Father   . Hypertension Mother   . Diabetes Mother   . Heart disease Brother        x 3   . Heart attack Brother        deceased  . Hypertension Sister   . Diabetes Sister   . Anesthesia problems Neg Hx   . Hypotension Neg Hx   . Malignant hyperthermia Neg Hx   . Pseudochol deficiency Neg Hx     Social History Social History   Tobacco Use  . Smoking status: Current Some Day Smoker    Packs/day: 0.25    Years: 25.00    Pack years: 6.25    Types: Cigarettes    Last attempt to quit: 05/24/2015    Years since quitting: 3.2  . Smokeless tobacco: Never Used  Substance Use Topics  . Alcohol use: No    Alcohol/week: 0.0 standard drinks  . Drug use: No    Allergies  Allergen Reactions  . Iohexol Anaphylaxis    PT. TO  BE PREMEDICATED PRIOR TO IV CONTRAST PER DR Kris Hartmann /MMS//12/15/15Desc: PT BECAME SOB AND CHEST TIGHTNESS AFTER CONTRAST INJECTION.  [name],RT-RCT., Onset Date: [id number]     Current Outpatient Medications  Medication Sig Dispense Refill  . acetaminophen-codeine (TYLENOL #3) 300-30 MG tablet Take 1 tablet by mouth every 12 (twelve) hours as needed for moderate pain. (Patient not taking: Reported on 08/29/2018) 60 tablet 0  . albuterol (PROAIR HFA) 108 (90 Base) MCG/ACT inhaler Inhale 1 puff into the lungs every 6 (six) hours as needed for wheezing or shortness of breath. 1 Inhaler 2  . amLODipine (NORVASC) 10 MG tablet Take 1 tablet (10 mg total) by mouth daily. 90 tablet 1  . aspirin 81 MG tablet Take 1 tablet (81 mg total) daily by mouth. 30 tablet   . baclofen (LIORESAL) 10 MG tablet Take 1 tablet (10 mg total) by mouth 3 (three) times daily. 90 each 1  . benzonatate (TESSALON) 100 MG capsule Take 1 capsule (100 mg total) by mouth every 8 (eight) hours. 21  capsule 0  . Blood Glucose Monitoring Suppl (ACCU-CHEK AVIVA PLUS) w/Device KIT Use as dircted 1 kit 0  . cetirizine (ZYRTEC) 10 MG tablet Take 1 tablet (10 mg total) daily by mouth. 30 tablet 1  . Cholecalciferol (VITAMIN D) 2000 units tablet Take 1 tablet (2,000 Units total) by mouth daily. 30 tablet 1  . clopidogrel (PLAVIX) 75 MG tablet Take 1 tablet (75 mg total) by mouth daily. YOU MAY RESTART 08/26/2018 90 tablet 1  . diclofenac sodium (VOLTAREN) 1 % GEL Apply 2 g topically 4 (four) times daily. 1 Tube 0  . FLUoxetine (PROZAC) 40 MG capsule Take 1 capsule (40 mg total) by mouth daily. 90 capsule 1  . fluticasone (FLONASE) 50 MCG/ACT nasal spray Place 2 sprays into both nostrils daily. (Patient taking differently: Place 2 sprays into both nostrils daily as needed for allergies. ) 16 g 6  . Fluticasone-Salmeterol (ADVAIR) 100-50 MCG/DOSE AEPB Inhale 1 puff into the lungs 2 (two) times daily. 1 each 3  . furosemide (LASIX) 20 MG tablet Take 20 mg by mouth as needed.     Marland Kitchen glucose blood (ACCU-CHEK AVIVA) test strip Use as instructed 100 each 12  . hydrALAZINE (APRESOLINE) 25 MG tablet Take 1 tablet (25 mg total) by mouth 2 (two) times daily. 180 tablet 1  . HYDROcodone-acetaminophen (HYCET) 7.5-325 mg/15 ml solution Take 10-15 mLs by mouth every 6 (six) hours as needed for moderate pain. 473 mL 0  . hydrocortisone-pramoxine (ANALPRAM-HC) 2.5-1 % rectal cream PLACE 1 APPLICATION RECTALLY 3 (THREE) TIMES DAILY. (Patient taking differently: Place 1 application rectally as needed. ) 30 g 1  . isosorbide mononitrate (IMDUR) 120 MG 24 hr tablet Take 1 tablet (120 mg total) by mouth daily. (Patient taking differently: Take 60 mg by mouth 2 (two) times daily. ) 90 tablet 1  . LORazepam (ATIVAN) 0.5 MG tablet 1 tab po q 4-6 hours prn or 1 tab po 30 minutes prior to radiation 30 tablet 0  . losartan-hydrochlorothiazide (HYZAAR) 100-25 MG tablet Take 1 tablet by mouth daily. 90 tablet 1  . metFORMIN  (GLUCOPHAGE) 1000 MG tablet Take 1 tablet (1,000 mg total) by mouth 2 (two) times daily with a meal. 180 tablet 1  . Nebivolol HCl 20 MG TABS Take 1 tablet (20 mg total) by mouth daily. 90 tablet 3  . nitroGLYCERIN (NITROSTAT) 0.4 MG SL tablet Place 1 tablet (0.4 mg total) under the tongue every 5 (five)  minutes as needed for chest pain. (Patient not taking: Reported on 08/29/2018) 25 tablet 2  . pantoprazole (PROTONIX) 40 MG tablet Take 1 tablet (40 mg total) by mouth 2 (two) times daily. 180 tablet 1  . prazosin (MINIPRESS) 1 MG capsule Take 1 capsule (1 mg total) by mouth at bedtime. For nightmares 30 capsule 2  . ranolazine (RANEXA) 1000 MG SR tablet Take 1 tablet (1,000 mg total) by mouth 2 (two) times daily. (Patient taking differently: Take 1,000 mg by mouth daily. ) 180 tablet 3  . rosuvastatin (CRESTOR) 20 MG tablet Take 1 tablet (20 mg total) by mouth daily. 90 tablet 1  . tamsulosin (FLOMAX) 0.4 MG CAPS capsule Take 1 capsule (0.4 mg total) by mouth daily. 30 capsule 5  . tiotropium (SPIRIVA HANDIHALER) 18 MCG inhalation capsule Place 1 capsule (18 mcg total) into inhaler and inhale daily. 90 capsule 1  . traZODone (DESYREL) 50 MG tablet Take 1 tablet (50 mg total) by mouth at bedtime as needed. for sleep 30 tablet 5  . vitamin B-12 (CYANOCOBALAMIN) 500 MCG tablet Take 1 tablet (500 mcg total) by mouth daily. 30 tablet 1   No current facility-administered medications for this visit.     Review of Systems  Constitutional: positive for fatigue Eyes: negative Ears, nose, mouth, throat, and face: negative Respiratory: positive for cough, dyspnea on exertion and pleurisy/chest pain Cardiovascular: negative Gastrointestinal: negative Genitourinary:negative Integument/breast: negative Hematologic/lymphatic: negative Musculoskeletal:negative Neurological: negative Behavioral/Psych: negative Endocrine: negative Allergic/Immunologic: negative  Physical Exam  FBX:UXYBF, healthy, no  distress, well nourished and well developed SKIN: skin color, texture, turgor are normal, no rashes or significant lesions HEAD: Normocephalic, No masses, lesions, tenderness or abnormalities EYES: normal, PERRLA, Conjunctiva are pink and non-injected EARS: External ears normal, Canals clear OROPHARYNX:no exudate, no erythema and lips, buccal mucosa, and tongue normal  NECK: supple, no adenopathy, no JVD LYMPH:  no palpable lymphadenopathy, no hepatosplenomegaly LUNGS: prolonged expiratory phase HEART: regular rate & rhythm, no murmurs and no gallops ABDOMEN:abdomen soft, non-tender, normal bowel sounds and no masses or organomegaly BACK: Back symmetric, no curvature., No CVA tenderness EXTREMITIES:no joint deformities, effusion, or inflammation, no edema  NEURO: alert & oriented x 3 with fluent speech, no focal motor/sensory deficits  PERFORMANCE STATUS: ECOG 1  LABORATORY DATA: Lab Results  Component Value Date   WBC 6.8 08/31/2018   HGB 12.6 (L) 08/31/2018   HCT 39.4 08/31/2018   MCV 82.9 08/31/2018   PLT 200 08/31/2018      Chemistry      Component Value Date/Time   NA 139 08/17/2018 1201   NA 144 07/25/2018 0949   K 3.7 08/17/2018 1201   CL 101 08/17/2018 1201   CO2 31 08/17/2018 1201   BUN 12 08/17/2018 1201   BUN 10 07/25/2018 0949   CREATININE 0.96 08/17/2018 1201   CREATININE 0.96 12/13/2016 1151      Component Value Date/Time   CALCIUM 8.9 08/17/2018 1201   ALKPHOS 66 07/25/2018 0949   AST 16 07/25/2018 0949   ALT 17 07/25/2018 0949   BILITOT 0.2 07/25/2018 0949       RADIOGRAPHIC STUDIES: Dg Chest 2 View  Result Date: 08/08/2018 CLINICAL DATA:  Worsening pain for 2 days. Follow-up lymphadenopathy and upper lobe mass. EXAM: CHEST - 2 VIEW COMPARISON:  CT chest July 31, 2018 FINDINGS: Fullness of the RIGHT paratracheal stripe and RIGHT hilum corresponding to lymphadenopathy on recent CT. No pleural effusion or focal consolidation. No pneumothorax.  Soft tissue planes  and included osseous structures are unchanged. Faint calcifications RIGHT neck are likely vascular. IMPRESSION: 1. No acute cardiopulmonary process. 2. lymphadenopathy better demonstrated on recent CT. Aortic Atherosclerosis (ICD10-I70.0). Electronically Signed   By: Elon Alas M.D.   On: 08/08/2018 20:25   Nm Pet Image Initial (pi) Skull Base To Thigh  Result Date: 08/21/2018 CLINICAL DATA:  Initial treatment strategy for right upper lobe mass. EXAM: NUCLEAR MEDICINE PET SKULL BASE TO THIGH TECHNIQUE: 12.3 mCi F-18 FDG was injected intravenously. Full-ring PET imaging was performed from the skull base to thigh after the radiotracer. CT data was obtained and used for attenuation correction and anatomic localization. Fasting blood glucose: 101 mg/dl COMPARISON:  Chest CT 07/31/2018 FINDINGS: Mediastinal blood pool activity: SUV max 3.2 NECK: Hypermetabolic right level II lymph nodes identified with SUV max = 5.3. No hypermetabolic lymphadenopathy in the left neck. Incidental CT findings: none CHEST: 4.2 x 3.3 cm right paratracheal mass is markedly hypermetabolic with SUV max = 99.3. A right suprahilar mass measuring 2.5 x 3.3 cm is also hypermetabolic with SUV max = 71.6. No other hypermetabolic disease in the mediastinum or left hilum. No hypermetabolic axillary lymphadenopathy. Incidental CT findings: Dependent atelectasis noted in the lower lungs. Centrilobular and paraseptal emphysema evident. ABDOMEN/PELVIS: No abnormal hypermetabolic activity within the liver, pancreas, adrenal glands, or spleen. No hypermetabolic lymph nodes in the abdomen or pelvis. Incidental CT findings: Small right groin hernia contains only fat. Atherosclerotic calcification noted distal abdominal aorta. SKELETON: No focal hypermetabolic activity to suggest skeletal metastasis. Incidental CT findings: none IMPRESSION: 1. Markedly hypermetabolic right paratracheal and right suprahilar lesions, compatible with  malignancy. 2. Small hypermetabolic right cervical level II lymph nodes, suspicious for metastatic disease. 3. No evidence for hypermetabolic disease in the abdomen or pelvis. Electronically Signed   By: Misty Stanley M.D.   On: 08/21/2018 10:00    ASSESSMENT: This is a very pleasant 52 years old African-American male recently diagnosed with limited stage (T3, N3, M0) small cell lung cancer presented with right paratracheal mass in addition to right suprahilar mass and lymphadenopathy as well as right cervical lymph node diagnosed in October 2019   PLAN: I had a lengthy discussion with the patient and his family about his current disease stage, prognosis and treatment options. I personally and independently reviewed the scan images and discussed the results with the patient and his family. I recommended for the patient to complete the staging work-up and he is scheduled to have MRI of the brain tomorrow. I discussed with the patient his treatment options and I recommended for him a course of systemic chemotherapy with cisplatin 80 mg/M2 on day 1 and etoposide 100 mg/M2 on days 1, 2 and 3 every 3 weeks.  This will be concurrent with radiotherapy probably starting with cycle #2. The patient may also be eligible for enrollment in the [place] clinical trial where the patient will receive Tecentriq (Atezolizumab) versus placebo in addition to his current systemic chemotherapy and radiation. I discussed with the patient the adverse effect of the chemotherapy including but not limited to alopecia, myelosuppression, nausea and vomiting, peripheral neuropathy, liver or renal dysfunction as well as hearing deficit. He is expected to start the first cycle of this treatment on September 11, 2018. The patient will have a chemotherapy education class before the first dose of his treatment. For depression I started the patient on Remeron 30 mg p.o. nightly. For smoking cessation I strongly encouraged the patient to  quit smoking and I started  him on NicoDerm patches. I will also call his pharmacy with prescription for Compazine 10 mg p.o. every 6 hours as needed for nausea. The patient will come back for follow-up visit in 1 week after the first dose of his treatment for evaluation and management of any adverse effect of his treatment. He was advised to call immediately if he has any concerning symptoms in the interval. The patient voices understanding of current disease status and treatment options and is in agreement with the current care plan. All questions were answered. The patient knows to call the clinic with any problems, questions or concerns. We can certainly see the patient much sooner if necessary.  Thank you so much for allowing me to participate in the care of [place]. I will continue to follow up the patient with you and assist in his care.  I spent 55 minutes counseling the patient face to face. The total time spent in the appointment was 80 minutes.  Disclaimer: This note was dictated with voice recognition software. Similar sounding words can inadvertently be transcribed and may not be corrected upon review.   Eilleen Kempf August 31, 2018, 1:42 PM

## 2018-08-31 NOTE — Telephone Encounter (Signed)
Scheduled appt per 10/10 los - patient is aware of appts being added and will pick up new schedule at chemo edu class.

## 2018-08-31 NOTE — Progress Notes (Signed)
Oncology Nurse Navigator Documentation  Oncology Nurse Navigator Flowsheets 08/31/2018  Navigator Location [facility]  Navigator Encounter Type Other/I received a message from [place] that patient wanted pain pills and not the liquid.  I called rad onc to up date and Alsion Perkins PA would like patient to get liquid due to upcoming radiation treatment.    Treatment Phase Pre-Tx/Tx Discussion  Barriers/Navigation Needs Coordination of Care  Interventions Coordination of Care  Coordination of Care Other  Acuity Level 2  Time Spent with Patient 30

## 2018-08-31 NOTE — Progress Notes (Signed)
START ON PATHWAY REGIMEN - Small Cell Lung     A cycle is every 21 days:     Etoposide      Cisplatin   **Always confirm dose/schedule in your pharmacy ordering system**  Patient Characteristics: Newly Diagnosed, Preoperative or Nonsurgical Candidate (Clinical Staging), First Line, Limited Stage, Nonsurgical Candidate Therapeutic Status: Newly Diagnosed, Preoperative or Nonsurgical Candidate (Clinical Staging) AJCC T Category: cT3 AJCC N Category: cN3 AJCC M Category: cM0 AJCC 8 Stage Grouping: IIIC Stage Classification: Limited Surgical Candidacy: Nonsurgical Candidate Intent of Therapy: Curative Intent, Discussed with Patient

## 2018-09-01 ENCOUNTER — Telehealth: Payer: Self-pay | Admitting: *Deleted

## 2018-09-01 ENCOUNTER — Telehealth: Payer: Self-pay | Admitting: Medical Oncology

## 2018-09-01 ENCOUNTER — Ambulatory Visit (HOSPITAL_COMMUNITY): Admission: RE | Admit: 2018-09-01 | Payer: Medicare Other | Source: Ambulatory Visit

## 2018-09-01 NOTE — Telephone Encounter (Signed)
CALLED PATIENT TO INFORM OF MRI FOR 09-06-18 - ARRIVAL TIME - 12:30 PM @ [facility], NO RESTRICTIONS TO TEST, SPOKE WITH PATIENT AND HE IS AWARE OF THIS TEST

## 2018-09-01 NOTE — Telephone Encounter (Signed)
Fax received from on call service . Pt called them today at 6 am. He is not feeling well today after taking some medicine. Wants to "r/s CTscan"( MRI). I left message for pt to call me back.

## 2018-09-03 NOTE — Telephone Encounter (Signed)
Andrew West- can you reschedule and let me know when he's having this done:?

## 2018-09-05 ENCOUNTER — Telehealth: Payer: Self-pay | Admitting: Pulmonary Disease

## 2018-09-05 NOTE — Telephone Encounter (Signed)
PA request received from [place] for [facility] at [phone] for PA.  This has been approved through 08/31/2019.   PA#: [id number].  Pharmacy aware of approval.  Nothing further needed.

## 2018-09-06 ENCOUNTER — Ambulatory Visit (HOSPITAL_COMMUNITY)
Admission: RE | Admit: 2018-09-06 | Discharge: 2018-09-06 | Disposition: A | Discharge status: Home / Self Care | Payer: Medicare Other | Source: Ambulatory Visit | Attending: Radiation Oncology | Admitting: Radiation Oncology

## 2018-09-06 ENCOUNTER — Other Ambulatory Visit: Payer: Self-pay | Admitting: Radiation Oncology

## 2018-09-06 DIAGNOSIS — R590 Localized enlarged lymph nodes: Secondary | ICD-10-CM | POA: Insufficient documentation

## 2018-09-06 DIAGNOSIS — Z510 Encounter for antineoplastic radiation therapy: Secondary | ICD-10-CM | POA: Diagnosis not present

## 2018-09-06 DIAGNOSIS — C3411 Malignant neoplasm of upper lobe, right bronchus or lung: Secondary | ICD-10-CM | POA: Diagnosis not present

## 2018-09-06 DIAGNOSIS — C3490 Malignant neoplasm of unspecified part of unspecified bronchus or lung: Secondary | ICD-10-CM | POA: Diagnosis not present

## 2018-09-06 MED ORDER — HYDROCODONE-ACETAMINOPHEN 5-325 MG PO TABS: 1.0000 | ORAL_TABLET | Freq: Four times a day (QID) | ORAL | 0 refills | Status: DC | PRN

## 2018-09-06 MED ORDER — GADOBUTROL 1 MMOL/ML IV SOLN
10.0000 mL | Freq: Once | INTRAVENOUS | Status: AC | PRN
  Administered 2018-09-06: 10 mL via INTRAVENOUS

## 2018-09-06 MED FILL — HYDROCODON-APAP 5-325: 5-325 | 15 days supply | Qty: 120 | Fill #0

## 2018-09-06 NOTE — Progress Notes (Signed)
I spoke with the patient to let him know his MRI of the brain was negative. He again requests his pain medication be in pill form. After discussing pros and cons I agreed and sent in [place] to his pharmacy. If he requests more medication before the end of the month, he will need re-assessment, possibly by a pain management specialist.

## 2018-09-07 ENCOUNTER — Other Ambulatory Visit: Payer: Self-pay | Admitting: Medical Oncology

## 2018-09-07 ENCOUNTER — Inpatient Hospital Stay: Payer: Medicare Other

## 2018-09-07 ENCOUNTER — Ambulatory Visit
Admission: RE | Admit: 2018-09-07 | Discharge: 2018-09-07 | Disposition: A | Discharge status: Home / Self Care | Payer: Medicare Other | Source: Ambulatory Visit | Attending: Radiation Oncology | Admitting: Radiation Oncology

## 2018-09-07 DIAGNOSIS — C3411 Malignant neoplasm of upper lobe, right bronchus or lung: Secondary | ICD-10-CM | POA: Diagnosis not present

## 2018-09-07 DIAGNOSIS — Z510 Encounter for antineoplastic radiation therapy: Secondary | ICD-10-CM | POA: Diagnosis not present

## 2018-09-07 DIAGNOSIS — I878 Other specified disorders of veins: Secondary | ICD-10-CM

## 2018-09-08 ENCOUNTER — Ambulatory Visit
Admission: RE | Admit: 2018-09-08 | Discharge: 2018-09-08 | Disposition: A | Discharge status: Home / Self Care | Payer: Medicare Other | Source: Ambulatory Visit | Attending: Radiation Oncology | Admitting: Radiation Oncology

## 2018-09-08 DIAGNOSIS — C3411 Malignant neoplasm of upper lobe, right bronchus or lung: Secondary | ICD-10-CM

## 2018-09-08 DIAGNOSIS — Z510 Encounter for antineoplastic radiation therapy: Secondary | ICD-10-CM | POA: Diagnosis not present

## 2018-09-08 MED ORDER — SONAFINE EX EMUL
1.0000 "application " | Freq: Once | CUTANEOUS | Status: AC
  Administered 2018-09-08: 1 via TOPICAL

## 2018-09-08 NOTE — Progress Notes (Signed)
Pt here for patient teaching.  Pt given Radiation and You booklet and Sonafine.  Reviewed areas of pertinence such as fatigue, hair loss, skin changes and throat changes . Pt able to give teach back of to pat skin,apply Sonafine bid and avoid applying anything to skin within 4 hours of treatment. Pt verbalizes understanding of information given and will contact nursing with any questions or concerns.     Gloriajean Dell. Leonie Green, BSN

## 2018-09-11 ENCOUNTER — Ambulatory Visit
Admission: RE | Admit: 2018-09-11 | Discharge: 2018-09-11 | Disposition: A | Discharge status: Home / Self Care | Payer: Medicare Other | Source: Ambulatory Visit | Attending: Radiation Oncology | Admitting: Radiation Oncology

## 2018-09-11 ENCOUNTER — Other Ambulatory Visit: Payer: Self-pay | Admitting: Family Medicine

## 2018-09-11 DIAGNOSIS — Z955 Presence of coronary angioplasty implant and graft: Secondary | ICD-10-CM

## 2018-09-11 DIAGNOSIS — Z510 Encounter for antineoplastic radiation therapy: Secondary | ICD-10-CM | POA: Diagnosis not present

## 2018-09-11 DIAGNOSIS — C3411 Malignant neoplasm of upper lobe, right bronchus or lung: Secondary | ICD-10-CM | POA: Diagnosis not present

## 2018-09-11 MED FILL — traZODone HCL 50 MG TABS: 50 | 30 days supply | Qty: 30 | Fill #1

## 2018-09-11 MED FILL — ISOSORBIDE MN 120 MG TAB SA: 120 | 30 days supply | Qty: 30 | Fill #1

## 2018-09-11 MED FILL — VALSARTAN-HCTZ 320-25 MG TA: 320-25 | 30 days supply | Qty: 30 | Fill #3

## 2018-09-11 MED FILL — TAMSULOSIN HCL 0.4 MG CAP: 0.4 | 30 days supply | Qty: 30 | Fill #1

## 2018-09-11 MED FILL — metFORMIN HCL 1000 MG TABS: 1000 | 90 days supply | Qty: 180 | Fill #0

## 2018-09-11 MED FILL — PRAZOSIN 1 MG CAPSULE: 1 | 30 days supply | Qty: 30 | Fill #1

## 2018-09-11 MED FILL — ROSUVASTATIN CALCIUM 20 MG: 20 | 90 days supply | Qty: 90 | Fill #1

## 2018-09-11 MED FILL — FLUoxetine HCL 40 MG CAPS: 40 | 30 days supply | Qty: 30 | Fill #3

## 2018-09-11 MED FILL — PANTOPRAZOLE SOD DR 40 MG T: 40 | 30 days supply | Qty: 60 | Fill #3

## 2018-09-11 MED FILL — CLOPIDOGREL 75 MG TABLET: 75 | 30 days supply | Qty: 30 | Fill #3

## 2018-09-11 MED FILL — hydrALAZINE HCL 25 MG TABS: 25 | 30 days supply | Qty: 60 | Fill #2

## 2018-09-11 NOTE — Telephone Encounter (Signed)
Pt last seen: 07/25/18 Next appt: 09/14/18 Last RX written on: 08/01/18 Date of original fill: 08/01/18 for #14 Date of refill(s): 08/07/18 #14, 08/14/18 #14, 08/21/18 #14, 08/28/18 #4 (It appears to have been filled this way because more than a 7 day supply would need a PA and I was not advised that he needed one, I will submit one if this refill is approved.    The patient DID FILL OTHER controlled substances during this time, the following were written by Shona Simpson with the [facility]: 08/29/18 Hydrocodone 7.5/325mg /53mLs Take 10-44mLs Q6 prn 452mLs dispensed(7 days supply) 08/30/18 Lorazepam 0.5mg  Take 1 tab po q4-6 prn or 1 tab po 30 mins prior to radiation #30 dispensed(5 days supply) 09/06/18 Hydrocodone 5/325mg  Take 1-2 tabs po q6 prn moderate pain  Please refill if appropriate.

## 2018-09-12 ENCOUNTER — Inpatient Hospital Stay: Payer: Medicare Other

## 2018-09-12 ENCOUNTER — Other Ambulatory Visit: Payer: Self-pay | Admitting: *Deleted

## 2018-09-12 ENCOUNTER — Ambulatory Visit
Admission: RE | Admit: 2018-09-12 | Discharge: 2018-09-12 | Disposition: A | Discharge status: Home / Self Care | Payer: Medicare Other | Source: Ambulatory Visit | Attending: Radiation Oncology | Admitting: Radiation Oncology

## 2018-09-12 ENCOUNTER — Encounter: Payer: Self-pay | Admitting: General Practice

## 2018-09-12 VITALS — BP 162/98 | HR 62 | Temp 98.4°F | Resp 20

## 2018-09-12 DIAGNOSIS — I10 Essential (primary) hypertension: Secondary | ICD-10-CM | POA: Diagnosis not present

## 2018-09-12 DIAGNOSIS — I2510 Atherosclerotic heart disease of native coronary artery without angina pectoris: Secondary | ICD-10-CM | POA: Diagnosis not present

## 2018-09-12 DIAGNOSIS — E119 Type 2 diabetes mellitus without complications: Secondary | ICD-10-CM | POA: Diagnosis not present

## 2018-09-12 DIAGNOSIS — C3411 Malignant neoplasm of upper lobe, right bronchus or lung: Secondary | ICD-10-CM

## 2018-09-12 DIAGNOSIS — J449 Chronic obstructive pulmonary disease, unspecified: Secondary | ICD-10-CM | POA: Diagnosis not present

## 2018-09-12 DIAGNOSIS — Z5111 Encounter for antineoplastic chemotherapy: Secondary | ICD-10-CM | POA: Diagnosis not present

## 2018-09-12 DIAGNOSIS — Z510 Encounter for antineoplastic radiation therapy: Secondary | ICD-10-CM | POA: Diagnosis not present

## 2018-09-12 LAB — CBC WITH DIFFERENTIAL (CANCER CENTER ONLY)
ABS IMMATURE GRANULOCYTES: 0.01 10*3/uL (ref 0.00–0.07)
Basophils Absolute: 0 10*3/uL (ref 0.0–0.1)
Basophils Relative: 0 %
Eosinophils Absolute: 0.2 10*3/uL (ref 0.0–0.5)
Eosinophils Relative: 4 %
HCT: 43.5 % (ref 39.0–52.0)
HEMOGLOBIN: 14 g/dL (ref 13.0–17.0)
IMMATURE GRANULOCYTES: 0 %
LYMPHS ABS: 1.3 10*3/uL (ref 0.7–4.0)
LYMPHS PCT: 28 %
MCH: 26.2 pg (ref 26.0–34.0)
MCHC: 32.2 g/dL (ref 30.0–36.0)
MCV: 81.3 fL (ref 80.0–100.0)
Monocytes Absolute: 0.4 10*3/uL (ref 0.1–1.0)
Monocytes Relative: 9 %
NRBC: 0 % (ref 0.0–0.2)
Neutro Abs: 2.7 10*3/uL (ref 1.7–7.7)
Neutrophils Relative %: 59 %
Platelet Count: 204 10*3/uL (ref 150–400)
RBC: 5.35 MIL/uL (ref 4.22–5.81)
RDW: 13.5 % (ref 11.5–15.5)
WBC Count: 4.6 10*3/uL (ref 4.0–10.5)

## 2018-09-12 LAB — CMP (CANCER CENTER ONLY)
ALT: 17 U/L (ref 0–44)
AST: 16 U/L (ref 15–41)
Albumin: 3.6 g/dL (ref 3.5–5.0)
Alkaline Phosphatase: 64 U/L (ref 38–126)
Anion gap: 9 (ref 5–15)
BUN: 9 mg/dL (ref 6–20)
CALCIUM: 9 mg/dL (ref 8.9–10.3)
CHLORIDE: 105 mmol/L (ref 98–111)
CO2: 26 mmol/L (ref 22–32)
Creatinine: 0.92 mg/dL (ref 0.61–1.24)
GFR, Estimated: >60 mL/min (ref >60)
Glucose, Bld: 108 mg/dL — ABNORMAL HIGH (ref 70–99)
POTASSIUM: 3.9 mmol/L (ref 3.5–5.1)
SODIUM: 140 mmol/L (ref 135–145)
Total Bilirubin: 0.5 mg/dL (ref 0.3–1.2)
Total Protein: 7.5 g/dL (ref 6.5–8.1)

## 2018-09-12 LAB — MAGNESIUM: MAGNESIUM: 1.8 mg/dL (ref 1.7–2.4)

## 2018-09-12 MED ORDER — CLONIDINE HCL 0.1 MG PO TABS: 0.1000 mg | ORAL_TABLET | Freq: Once | ORAL | Status: DC

## 2018-09-12 MED ORDER — CLONIDINE HCL 0.1 MG PO TABS
0.1000 mg | ORAL_TABLET | Freq: Once | ORAL | Status: AC
  Administered 2018-09-12: 0.1 mg via ORAL

## 2018-09-12 MED ORDER — SODIUM CHLORIDE 0.9 % IV SOLN
100.0000 mg/m2 | Freq: Once | INTRAVENOUS | Status: AC
  Administered 2018-09-12: 230 mg via INTRAVENOUS
  Filled 2018-09-12: qty 11.5

## 2018-09-12 MED ORDER — PALONOSETRON HCL INJECTION 0.25 MG/5ML
0.2500 mg | Freq: Once | INTRAVENOUS | Status: AC
  Administered 2018-09-12: 0.25 mg via INTRAVENOUS

## 2018-09-12 MED ORDER — PALONOSETRON HCL INJECTION 0.25 MG/5ML
INTRAVENOUS | Status: AC
  Filled 2018-09-12: qty 5

## 2018-09-12 MED ORDER — SODIUM CHLORIDE 0.9 % IV SOLN
80.0000 mg/m2 | Freq: Once | INTRAVENOUS | Status: AC
  Administered 2018-09-12: 186 mg via INTRAVENOUS
  Filled 2018-09-12: qty 186

## 2018-09-12 MED ORDER — SODIUM CHLORIDE 0.9% FLUSH
10.0000 mL | INTRAVENOUS | Status: DC | PRN
  Filled 2018-09-12: qty 10

## 2018-09-12 MED ORDER — SODIUM CHLORIDE 0.9 % IV SOLN
Freq: Once | INTRAVENOUS | Status: AC
  Administered 2018-09-12: 11:00:00 via INTRAVENOUS
  Filled 2018-09-12: qty 250

## 2018-09-12 MED ORDER — HEPARIN SOD (PORK) LOCK FLUSH 100 UNIT/ML IV SOLN
500.0000 [IU] | Freq: Once | INTRAVENOUS | Status: DC | PRN
  Filled 2018-09-12: qty 5

## 2018-09-12 MED ORDER — POTASSIUM CHLORIDE 2 MEQ/ML IV SOLN
Freq: Once | INTRAVENOUS | Status: AC
  Administered 2018-09-12: 11:00:00 via INTRAVENOUS
  Filled 2018-09-12: qty 10

## 2018-09-12 MED ORDER — SODIUM CHLORIDE 0.9 % IV SOLN
Freq: Once | INTRAVENOUS | Status: AC
  Administered 2018-09-12: 13:00:00 via INTRAVENOUS
  Filled 2018-09-12: qty 5

## 2018-09-12 MED ORDER — CLONIDINE HCL 0.1 MG PO TABS
ORAL_TABLET | ORAL | Status: AC
  Filled 2018-09-12: qty 1

## 2018-09-12 NOTE — Progress Notes (Signed)
At 1730 patient had approximately 50-75 ml of fluid remaining in bag of D5 and 0.45% NaCl. Patient stated "I'm ready to go. Please take me off." I informed patient that he only had a little bit of fluid remaining but he stated again that he is ready to leave. Patient disconnected and IV removed. Encouraged patient to continue to drink fluids since he is cutting short his post hydration. Patient verbalized understanding.

## 2018-09-12 NOTE — Progress Notes (Signed)
OK to tx today   Per Dr. Julien Nordmann, [place] to run post hydration fluids with last chemo. Verified OK to run with etoposide with Burman Nieves, [facility].

## 2018-09-12 NOTE — Progress Notes (Signed)
Clonidine ordered per MD for elevated BP

## 2018-09-12 NOTE — Patient Instructions (Signed)
Your port-a-cath surgery is scheduled for 09/20/18 at 10:00am.  Per Dr. Julien Nordmann, please stop taking your Plavix 10/24, and restart it 10/31.    [facility] Discharge Instructions for Patients Receiving Chemotherapy  Today you received the following chemotherapy agents: Cisplatin, Etoposide.  To help prevent nausea and vomiting after your treatment, we encourage you to take your nausea medication as prescribed.   If you develop nausea and vomiting that is not controlled by your nausea medication, call the clinic.   BELOW ARE SYMPTOMS THAT SHOULD BE REPORTED IMMEDIATELY:  *FEVER GREATER THAN 100.5 F  *CHILLS WITH OR WITHOUT FEVER  NAUSEA AND VOMITING THAT IS NOT CONTROLLED WITH YOUR NAUSEA MEDICATION  *UNUSUAL SHORTNESS OF BREATH  *UNUSUAL BRUISING OR BLEEDING  TENDERNESS IN MOUTH AND THROAT WITH OR WITHOUT PRESENCE OF ULCERS  *URINARY PROBLEMS  *BOWEL PROBLEMS  UNUSUAL RASH Items with * indicate a potential emergency and should be followed up as soon as possible.  Feel free to call the clinic should you have any questions or concerns. The clinic phone number is [phone].  Please show the [facility] at check-in to the Emergency Department and triage nurse.   Cisplatin injection What is this medicine? CISPLATIN (SIS pla tin) is a chemotherapy drug. It targets fast dividing cells, like cancer cells, and causes these cells to die. This medicine is used to treat many types of cancer like bladder, ovarian, and testicular cancers. This medicine may be used for other purposes; ask your health care provider or pharmacist if you have questions. COMMON BRAND NAME(S): Platinol, Platinol -AQ What should I tell my health care provider before I take this medicine? They need to know if you have any of these conditions: -blood disorders -hearing problems -kidney disease -recent or ongoing radiation therapy -an unusual or allergic reaction to cisplatin,  carboplatin, other chemotherapy, other medicines, foods, dyes, or preservatives -pregnant or trying to get pregnant -breast-feeding How should I use this medicine? This drug is given as an infusion into a vein. It is administered in a hospital or clinic by a specially trained health care professional. Talk to your pediatrician regarding the use of this medicine in children. Special care may be needed. Overdosage: If you think you have taken too much of this medicine contact a poison control center or emergency room at once. NOTE: This medicine is only for you. Do not share this medicine with others. What if I miss a dose? It is important not to miss a dose. Call your doctor or health care professional if you are unable to keep an appointment. What may interact with this medicine? -dofetilide -foscarnet -medicines for seizures -medicines to increase blood counts like filgrastim, pegfilgrastim, sargramostim -probenecid -pyridoxine used with altretamine -rituximab -some antibiotics like amikacin, gentamicin, neomycin, polymyxin B, streptomycin, tobramycin -sulfinpyrazone -vaccines -zalcitabine Talk to your doctor or health care professional before taking any of these medicines: -acetaminophen -aspirin -ibuprofen -ketoprofen -naproxen This list may not describe all possible interactions. Give your health care provider a list of all the medicines, herbs, non-prescription drugs, or dietary supplements you use. Also tell them if you smoke, drink alcohol, or use illegal drugs. Some items may interact with your medicine. What should I watch for while using this medicine? Your condition will be monitored carefully while you are receiving this medicine. You will need important blood work done while you are taking this medicine. This drug may make you feel generally unwell. This is not uncommon, as chemotherapy can affect  healthy cells as well as cancer cells. Report any side effects. Continue  your course of treatment even though you feel ill unless your doctor tells you to stop. In some cases, you may be given additional medicines to help with side effects. Follow all directions for their use. Call your doctor or health care professional for advice if you get a fever, chills or sore throat, or other symptoms of a cold or flu. Do not treat yourself. This drug decreases your body's ability to fight infections. Try to avoid being around people who are sick. This medicine may increase your risk to bruise or bleed. Call your doctor or health care professional if you notice any unusual bleeding. Be careful brushing and flossing your teeth or using a toothpick because you may get an infection or bleed more easily. If you have any dental work done, tell your dentist you are receiving this medicine. Avoid taking products that contain aspirin, acetaminophen, ibuprofen, naproxen, or ketoprofen unless instructed by your doctor. These medicines may hide a fever. Do not become pregnant while taking this medicine. Women should inform their doctor if they wish to become pregnant or think they might be pregnant. There is a potential for serious side effects to an unborn child. Talk to your health care professional or pharmacist for more information. Do not breast-feed an infant while taking this medicine. Drink fluids as directed while you are taking this medicine. This will help protect your kidneys. Call your doctor or health care professional if you get diarrhea. Do not treat yourself. What side effects may I notice from receiving this medicine? Side effects that you should report to your doctor or health care professional as soon as possible: -allergic reactions like skin rash, itching or hives, swelling of the face, lips, or tongue -signs of infection - fever or chills, cough, sore throat, pain or difficulty passing urine -signs of decreased platelets or bleeding - bruising, pinpoint red spots on the  skin, black, tarry stools, nosebleeds -signs of decreased red blood cells - unusually weak or tired, fainting spells, lightheadedness -breathing problems -changes in hearing -gout pain -low blood counts - This drug may decrease the number of white blood cells, red blood cells and platelets. You may be at increased risk for infections and bleeding. -nausea and vomiting -pain, swelling, redness or irritation at the injection site -pain, tingling, numbness in the hands or feet -problems with balance, movement -trouble passing urine or change in the amount of urine Side effects that usually do not require medical attention (report to your doctor or health care professional if they continue or are bothersome): -changes in vision -loss of appetite -metallic taste in the mouth or changes in taste This list may not describe all possible side effects. Call your doctor for medical advice about side effects. You may report side effects to FDA at 1-800-FDA-1088. Where should I keep my medicine? This drug is given in a hospital or clinic and will not be stored at home. NOTE: This sheet is a summary. It may not cover all possible information. If you have questions about this medicine, talk to your doctor, pharmacist, or health care provider.  2018 Elsevier/Gold Standard (2008-02-13 14:40:54)   Etoposide, VP-16 injection What is this medicine? ETOPOSIDE, VP-16 (e toe POE side) is a chemotherapy drug. It is used to treat testicular cancer, lung cancer, and other cancers. This medicine may be used for other purposes; ask your health care provider or pharmacist if you have questions. COMMON BRAND  NAME(S): Etopophos, Toposar, VePesid What should I tell my health care provider before I take this medicine? They need to know if you have any of these conditions: -infection -kidney disease -liver disease -low blood counts, like low white cell, platelet, or red cell counts -an unusual or allergic reaction  to etoposide, other medicines, foods, dyes, or preservatives -pregnant or trying to get pregnant -breast-feeding How should I use this medicine? This medicine is for infusion into a vein. It is administered in a hospital or clinic by a specially trained health care professional. Talk to your pediatrician regarding the use of this medicine in children. Special care may be needed. Overdosage: If you think you have taken too much of this medicine contact a poison control center or emergency room at once. NOTE: This medicine is only for you. Do not share this medicine with others. What if I miss a dose? It is important not to miss your dose. Call your doctor or health care professional if you are unable to keep an appointment. What may interact with this medicine? -aspirin -certain medications for seizures like carbamazepine, phenobarbital, phenytoin, valproic acid -cyclosporine -levamisole -warfarin This list may not describe all possible interactions. Give your health care provider a list of all the medicines, herbs, non-prescription drugs, or dietary supplements you use. Also tell them if you smoke, drink alcohol, or use illegal drugs. Some items may interact with your medicine. What should I watch for while using this medicine? Visit your doctor for checks on your progress. This drug may make you feel generally unwell. This is not uncommon, as chemotherapy can affect healthy cells as well as cancer cells. Report any side effects. Continue your course of treatment even though you feel ill unless your doctor tells you to stop. In some cases, you may be given additional medicines to help with side effects. Follow all directions for their use. Call your doctor or health care professional for advice if you get a fever, chills or sore throat, or other symptoms of a cold or flu. Do not treat yourself. This drug decreases your body's ability to fight infections. Try to avoid being around people who are  sick. This medicine may increase your risk to bruise or bleed. Call your doctor or health care professional if you notice any unusual bleeding. Talk to your doctor about your risk of cancer. You may be more at risk for certain types of cancers if you take this medicine. Do not become pregnant while taking this medicine or for at least 6 months after stopping it. Women should inform their doctor if they wish to become pregnant or think they might be pregnant. Women of child-bearing potential will need to have a negative pregnancy test before starting this medicine. There is a potential for serious side effects to an unborn child. Talk to your health care professional or pharmacist for more information. Do not breast-feed an infant while taking this medicine. Men must use a latex condom during sexual contact with a woman while taking this medicine and for at least 4 months after stopping it. A latex condom is needed even if you have had a vasectomy. Contact your doctor right away if your partner becomes pregnant. Do not donate sperm while taking this medicine and for at least 4 months after you stop taking this medicine. Men should inform their doctors if they wish to father a child. This medicine may lower sperm counts. What side effects may I notice from receiving this medicine? Side effects  that you should report to your doctor or health care professional as soon as possible: -allergic reactions like skin rash, itching or hives, swelling of the face, lips, or tongue -low blood counts - this medicine may decrease the number of white blood cells, red blood cells and platelets. You may be at increased risk for infections and bleeding. -signs of infection - fever or chills, cough, sore throat, pain or difficulty passing urine -signs of decreased platelets or bleeding - bruising, pinpoint red spots on the skin, black, tarry stools, blood in the urine -signs of decreased red blood cells - unusually weak or  tired, fainting spells, lightheadedness -breathing problems -changes in vision -mouth or throat sores or ulcers -pain, redness, swelling or irritation at the injection site -pain, tingling, numbness in the hands or feet -redness, blistering, peeling or loosening of the skin, including inside the mouth -seizures -vomiting Side effects that usually do not require medical attention (report to your doctor or health care professional if they continue or are bothersome): -diarrhea -hair loss -loss of appetite -nausea -stomach pain This list may not describe all possible side effects. Call your doctor for medical advice about side effects. You may report side effects to FDA at 1-800-FDA-1088. Where should I keep my medicine? This drug is given in a hospital or clinic and will not be stored at home. NOTE: This sheet is a summary. It may not cover all possible information. If you have questions about this medicine, talk to your doctor, pharmacist, or health care provider.  2018 Elsevier/Gold Standard (2015-10-31 11:53:23)

## 2018-09-12 NOTE — Progress Notes (Signed)
Run hydration fluids with Cisplatin.

## 2018-09-12 NOTE — Progress Notes (Signed)
[facility] Spiritual Care Note  Referred by pt's chemo nurse Chelsea for emotional support related to his reticence to pursue tx today. Per Mr Andrew West, he "just [doesn't] want this" (to pursue/to need chemo). Although he did not engage much in conversation, he did share that he has been having anxiety dreams related to treatment fears and past experiences [of unspecified content]. He also states that he's "not someone who sits still," naming that the lengthy treatment time compounds his stress. At the same time, he named factors that encourage him to try tx, including his and his wife [name]'s collective 16 grandchildren. During our conversation, he repeated that he would try to make it through treatment today; from there, he will be able to make more informed decisions about how to proceed in the future.  Social Work plans to [employer]. Encouraged couple (individually or together) to consider connecting with our counseling intern for an additional layer of support.   [place], [place], [facility] Pager [phone] Voicemail [phone]

## 2018-09-13 ENCOUNTER — Ambulatory Visit
Admission: RE | Admit: 2018-09-13 | Discharge: 2018-09-13 | Disposition: A | Discharge status: Home / Self Care | Payer: Medicare Other | Source: Ambulatory Visit | Attending: Radiation Oncology | Admitting: Radiation Oncology

## 2018-09-13 ENCOUNTER — Telehealth: Payer: Self-pay

## 2018-09-13 ENCOUNTER — Inpatient Hospital Stay: Payer: Medicare Other

## 2018-09-13 VITALS — BP 116/79 | HR 65 | Temp 98.1°F | Resp 18

## 2018-09-13 DIAGNOSIS — J449 Chronic obstructive pulmonary disease, unspecified: Secondary | ICD-10-CM | POA: Diagnosis not present

## 2018-09-13 DIAGNOSIS — C3411 Malignant neoplasm of upper lobe, right bronchus or lung: Secondary | ICD-10-CM

## 2018-09-13 DIAGNOSIS — Z510 Encounter for antineoplastic radiation therapy: Secondary | ICD-10-CM | POA: Diagnosis not present

## 2018-09-13 DIAGNOSIS — Z5111 Encounter for antineoplastic chemotherapy: Secondary | ICD-10-CM | POA: Diagnosis not present

## 2018-09-13 DIAGNOSIS — I2510 Atherosclerotic heart disease of native coronary artery without angina pectoris: Secondary | ICD-10-CM | POA: Diagnosis not present

## 2018-09-13 DIAGNOSIS — I10 Essential (primary) hypertension: Secondary | ICD-10-CM | POA: Diagnosis not present

## 2018-09-13 DIAGNOSIS — E119 Type 2 diabetes mellitus without complications: Secondary | ICD-10-CM | POA: Diagnosis not present

## 2018-09-13 MED ORDER — DEXAMETHASONE SODIUM PHOSPHATE 10 MG/ML IJ SOLN
INTRAMUSCULAR | Status: AC
  Filled 2018-09-13: qty 1

## 2018-09-13 MED ORDER — SODIUM CHLORIDE 0.9 % IV SOLN
Freq: Once | INTRAVENOUS | Status: AC
  Administered 2018-09-13: 11:00:00 via INTRAVENOUS
  Filled 2018-09-13: qty 250

## 2018-09-13 MED ORDER — DEXAMETHASONE SODIUM PHOSPHATE 10 MG/ML IJ SOLN
10.0000 mg | Freq: Once | INTRAMUSCULAR | Status: AC
  Administered 2018-09-13: 10 mg via INTRAVENOUS

## 2018-09-13 MED ORDER — SODIUM CHLORIDE 0.9 % IV SOLN
100.0000 mg/m2 | Freq: Once | INTRAVENOUS | Status: AC
  Administered 2018-09-13: 230 mg via INTRAVENOUS
  Filled 2018-09-13: qty 11.5

## 2018-09-13 NOTE — Telephone Encounter (Signed)
Pt was confused about his appts today 10/23, and didn't know he needed to come for his infusion at 07:30. Told pt to still come in for his infusion appt, and we would work him in when a chair became available. Spoke with Miranda-RT on [employer] to update them and see if they could see the pt first so as not to put them behind. Miranda stated that would not be a problem. Called pt back to tell him to check in with radiation first and then come back up to the lobby to check in for his infusion appt. Assured the pt his infusion nurse would go over his schedule to make sure he understood his future appts. Pt verbalized understanding and agreement. Melanie Rodgers-AD updated on plan.

## 2018-09-13 NOTE — Patient Instructions (Signed)
Your port-a-cath surgery is scheduled for 09/20/18 at 10:00am.  Per Dr. Julien Nordmann, please stop taking your Plavix 10/24, and restart it 10/31.    [facility] Discharge Instructions for Patients Receiving Chemotherapy  Today you received the following chemotherapy agents: Etoposide.  To help prevent nausea and vomiting after your treatment, we encourage you to take your nausea medication as prescribed.   If you develop nausea and vomiting that is not controlled by your nausea medication, call the clinic.   BELOW ARE SYMPTOMS THAT SHOULD BE REPORTED IMMEDIATELY:  *FEVER GREATER THAN 100.5 F  *CHILLS WITH OR WITHOUT FEVER  NAUSEA AND VOMITING THAT IS NOT CONTROLLED WITH YOUR NAUSEA MEDICATION  *UNUSUAL SHORTNESS OF BREATH  *UNUSUAL BRUISING OR BLEEDING  TENDERNESS IN MOUTH AND THROAT WITH OR WITHOUT PRESENCE OF ULCERS  *URINARY PROBLEMS  *BOWEL PROBLEMS  UNUSUAL RASH Items with * indicate a potential emergency and should be followed up as soon as possible.  Feel free to call the clinic should you have any questions or concerns. The clinic phone number is [phone].  Please show the [facility] at check-in to the Emergency Department and triage nurse.

## 2018-09-14 ENCOUNTER — Ambulatory Visit
Admission: RE | Admit: 2018-09-14 | Discharge: 2018-09-14 | Disposition: A | Discharge status: Home / Self Care | Payer: Medicare Other | Source: Ambulatory Visit | Attending: Radiation Oncology | Admitting: Radiation Oncology

## 2018-09-14 ENCOUNTER — Inpatient Hospital Stay: Payer: Medicare Other

## 2018-09-14 ENCOUNTER — Encounter: Payer: Self-pay | Admitting: Family Medicine

## 2018-09-14 ENCOUNTER — Ambulatory Visit: Payer: Medicare Other | Attending: Family Medicine | Admitting: Family Medicine

## 2018-09-14 VITALS — BP 173/106 | HR 64 | Temp 97.9°F | Ht 69.0 in | Wt 250.0 lb

## 2018-09-14 DIAGNOSIS — I10 Essential (primary) hypertension: Secondary | ICD-10-CM | POA: Diagnosis not present

## 2018-09-14 DIAGNOSIS — I208 Other forms of angina pectoris: Secondary | ICD-10-CM | POA: Diagnosis not present

## 2018-09-14 DIAGNOSIS — Z9889 Other specified postprocedural states: Secondary | ICD-10-CM | POA: Insufficient documentation

## 2018-09-14 DIAGNOSIS — J449 Chronic obstructive pulmonary disease, unspecified: Secondary | ICD-10-CM | POA: Diagnosis not present

## 2018-09-14 DIAGNOSIS — G4730 Sleep apnea, unspecified: Secondary | ICD-10-CM | POA: Insufficient documentation

## 2018-09-14 DIAGNOSIS — G4709 Other insomnia: Secondary | ICD-10-CM | POA: Diagnosis not present

## 2018-09-14 DIAGNOSIS — M545 Low back pain: Secondary | ICD-10-CM | POA: Insufficient documentation

## 2018-09-14 DIAGNOSIS — I25110 Atherosclerotic heart disease of native coronary artery with unstable angina pectoris: Secondary | ICD-10-CM | POA: Diagnosis not present

## 2018-09-14 DIAGNOSIS — C3411 Malignant neoplasm of upper lobe, right bronchus or lung: Secondary | ICD-10-CM | POA: Diagnosis not present

## 2018-09-14 DIAGNOSIS — Z79899 Other long term (current) drug therapy: Secondary | ICD-10-CM | POA: Insufficient documentation

## 2018-09-14 DIAGNOSIS — E1100 Type 2 diabetes mellitus with hyperosmolarity without nonketotic hyperglycemic-hyperosmolar coma (NKHHC): Secondary | ICD-10-CM | POA: Insufficient documentation

## 2018-09-14 DIAGNOSIS — G4700 Insomnia, unspecified: Secondary | ICD-10-CM | POA: Diagnosis not present

## 2018-09-14 DIAGNOSIS — Z7982 Long term (current) use of aspirin: Secondary | ICD-10-CM | POA: Diagnosis not present

## 2018-09-14 DIAGNOSIS — M79605 Pain in left leg: Secondary | ICD-10-CM | POA: Insufficient documentation

## 2018-09-14 DIAGNOSIS — F329 Major depressive disorder, single episode, unspecified: Secondary | ICD-10-CM | POA: Insufficient documentation

## 2018-09-14 DIAGNOSIS — F419 Anxiety disorder, unspecified: Secondary | ICD-10-CM | POA: Diagnosis not present

## 2018-09-14 DIAGNOSIS — K219 Gastro-esophageal reflux disease without esophagitis: Secondary | ICD-10-CM | POA: Insufficient documentation

## 2018-09-14 DIAGNOSIS — Z510 Encounter for antineoplastic radiation therapy: Secondary | ICD-10-CM | POA: Diagnosis not present

## 2018-09-14 DIAGNOSIS — Z955 Presence of coronary angioplasty implant and graft: Secondary | ICD-10-CM | POA: Diagnosis not present

## 2018-09-14 DIAGNOSIS — Z5111 Encounter for antineoplastic chemotherapy: Secondary | ICD-10-CM | POA: Diagnosis not present

## 2018-09-14 DIAGNOSIS — Z888 Allergy status to other drugs, medicaments and biological substances status: Secondary | ICD-10-CM | POA: Diagnosis not present

## 2018-09-14 DIAGNOSIS — G8929 Other chronic pain: Secondary | ICD-10-CM | POA: Diagnosis not present

## 2018-09-14 DIAGNOSIS — E119 Type 2 diabetes mellitus without complications: Secondary | ICD-10-CM | POA: Diagnosis not present

## 2018-09-14 DIAGNOSIS — R590 Localized enlarged lymph nodes: Secondary | ICD-10-CM | POA: Insufficient documentation

## 2018-09-14 DIAGNOSIS — Z7984 Long term (current) use of oral hypoglycemic drugs: Secondary | ICD-10-CM | POA: Insufficient documentation

## 2018-09-14 DIAGNOSIS — E785 Hyperlipidemia, unspecified: Secondary | ICD-10-CM | POA: Insufficient documentation

## 2018-09-14 DIAGNOSIS — M79604 Pain in right leg: Secondary | ICD-10-CM | POA: Insufficient documentation

## 2018-09-14 DIAGNOSIS — Z87891 Personal history of nicotine dependence: Secondary | ICD-10-CM | POA: Insufficient documentation

## 2018-09-14 DIAGNOSIS — E0800 Diabetes mellitus due to underlying condition with hyperosmolarity without nonketotic hyperglycemic-hyperosmolar coma (NKHHC): Secondary | ICD-10-CM | POA: Diagnosis not present

## 2018-09-14 DIAGNOSIS — Z9861 Coronary angioplasty status: Secondary | ICD-10-CM | POA: Diagnosis not present

## 2018-09-14 DIAGNOSIS — I2510 Atherosclerotic heart disease of native coronary artery without angina pectoris: Secondary | ICD-10-CM

## 2018-09-14 LAB — GLUCOSE, POCT (MANUAL RESULT ENTRY): POC GLUCOSE: 101 mg/dL — AB (ref 70–99)

## 2018-09-14 MED ORDER — ZOLPIDEM TARTRATE 5 MG PO TABS: 5.0000 mg | ORAL_TABLET | Freq: Every evening | ORAL | 2 refills | Status: DC | PRN

## 2018-09-14 MED ORDER — SODIUM CHLORIDE 0.9 % IV SOLN
Freq: Once | INTRAVENOUS | Status: AC
  Administered 2018-09-14: 08:00:00 via INTRAVENOUS
  Filled 2018-09-14: qty 250

## 2018-09-14 MED ORDER — DEXAMETHASONE SODIUM PHOSPHATE 10 MG/ML IJ SOLN
10.0000 mg | Freq: Once | INTRAMUSCULAR | Status: AC
  Administered 2018-09-14: 10 mg via INTRAVENOUS

## 2018-09-14 MED ORDER — SODIUM CHLORIDE 0.9 % IV SOLN
100.0000 mg/m2 | Freq: Once | INTRAVENOUS | Status: AC
  Administered 2018-09-14: 230 mg via INTRAVENOUS
  Filled 2018-09-14: qty 11.5

## 2018-09-14 MED ORDER — DEXAMETHASONE SODIUM PHOSPHATE 10 MG/ML IJ SOLN
INTRAMUSCULAR | Status: AC
  Filled 2018-09-14: qty 1

## 2018-09-14 MED FILL — ZOLPIDEM TARTRATE 5 MG TAB: 5 | 30 days supply | Qty: 30 | Fill #0

## 2018-09-14 NOTE — Progress Notes (Signed)
Subjective:  Patient ID: Andrew West, male    DOB: [birth year]  Age: 52 y.o. MRN: [id number]  CC: Diabetes; Depression; and Anxiety   HPI Andrew West is a 52 y.o. 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 A CXR performed as part of an ED chest pain work up revealedd prominent soft tissue process over right lower paratracheal region for which additional work up followed.  CT chest w/o contract: IMPRESSION: Mediastinal lymphadenopathy as well as suggestion of a focal soft tissue mass lesion within the right upper lobe. Further evaluation by means of PET-CT imaging is recommended. Additionally endoscopic guided biopsy is recommended for tissue sampling.  PET scan: IMPRESSION: 1. Markedly hypermetabolic right paratracheal and right suprahilar lesions, compatible with malignancy. 2. Small hypermetabolic right cervical level II lymph nodes, suspicious for metastatic disease. 3. No evidence for hypermetabolic disease in the abdomen or pelvis.  He underwent flexible fiberoptic bronchoscopy and final cytology was in keeping with small cell carcinoma. He is being followed by Oncology and is currently on chemotherapy with Cisplatin and Etoposide as well as radiation. He was commenced on Remeron for Depression.  He is in good spirits today and is accompanied by his wife. He does have some fatigue and insomnia which is uncontrolled on Trazodone. He tried Ambien in the past with improvement. His BP is elevated but was controlled at 124/77 at his last visit and he endorses compliance with his antihypertensives.  Past Medical History:  Diagnosis Date  .  Anxiety   . CAD (coronary artery disease)    a. s/p multiple PCIs with last cath 11/2016 with severe multivessel CAD, s/p PCTA to LCx but unable to pass stent  . Chronic leg pain    bilateral  . Chronic lower back pain   . COPD (chronic obstructive pulmonary disease) (HCC)   . Depression   . GERD (gastroesophageal reflux disease)    Takes Dexilant  . HLD (hyperlipidemia)   . Hypertension   . Rhabdomyolysis    h/o, r/t statins  . Sleep apnea    "can't tolerate mask" (12/16/2016)  . Type II diabetes mellitus (HCC)     Past Surgical History:  Procedure Laterality Date  . BACK SURGERY    . BRONCHIAL BIOPSY  08/25/2018   Procedure: BRONCHIAL BIOPSIES;  Surgeon: Josephine Igo, DO;  Location: [facility];  Service: Cardiopulmonary;;  . CARDIAC CATHETERIZATION N/A 09/25/2015   Procedure: Left Heart Cath and Coronary Angiography;  Surgeon: Marykay Lex, MD;  Location: [facility] INVASIVE CV LAB;  Service: Cardiovascular;  Laterality: N/A;  . CARDIAC CATHETERIZATION N/A 12/16/2016   Procedure: Left Heart Cath and Coronary Angiography;  Surgeon: Marykay Lex, MD;  Location: [facility] INVASIVE CV LAB;  Service: Cardiovascular;  Laterality: N/A;  . CARDIAC CATHETERIZATION N/A 12/16/2016   Procedure: Coronary Balloon Angioplasty;  Surgeon: Marykay Lex, MD;  Location: [facility] INVASIVE CV LAB;  Service: Cardiovascular;  Laterality: N/A;  . COLONOSCOPY W/ POLYPECTOMY    . CORONARY ANGIOPLASTY  09/25/2015   mid cir & om  . CORONARY ANGIOPLASTY WITH STENT PLACEMENT  10/09/2001   PTCA & stenting of mid AV circumflex; 2.5x34mm Pixel  stent  . CORONARY ANGIOPLASTY WITH STENT PLACEMENT  12/13/2001   PCI with stent to mid L circumflex, 95% stenosis to 0% residual  . CORONARY ANGIOPLASTY WITH STENT PLACEMENT  10/10/2003   PCI to mid AV circumflex; LAD 30% disease; RCA 100% occluded prox.  . CORONARY ANGIOPLASTY WITH STENT PLACEMENT  09/01/2011   PCI with stenting with bare metal stent to mid AV groove circumflex  and PDA  . CORONARY ANGIOPLASTY WITH STENT PLACEMENT  10/17/2011   cutting balloon angioplasty of ostial lateral OM1 branch and bifurcation AV groove circumflex OM junction; stenosis reduced to 0%  . ENDOBRONCHIAL ULTRASOUND Bilateral 08/25/2018   Procedure: ENDOBRONCHIAL ULTRASOUND;  Surgeon: Josephine Igo, DO;  Location: [facility];  Service: Cardiopulmonary;  Laterality: Bilateral;  . EXCISIONAL HEMORRHOIDECTOMY    . FINE NEEDLE ASPIRATION  08/25/2018   Procedure: FINE NEEDLE ASPIRATION;  Surgeon: Josephine Igo, DO;  Location: [facility];  Service: Cardiopulmonary;;  . FLEXIBLE BRONCHOSCOPY  08/25/2018   Procedure: FLEXIBLE BRONCHOSCOPY;  Surgeon: Josephine Igo, DO;  Location: [facility];  Service: Cardiopulmonary;;  . LEFT HEART CATHETERIZATION WITH CORONARY ANGIOGRAM N/A 10/18/2011   Procedure: LEFT HEART CATHETERIZATION WITH CORONARY ANGIOGRAM;  Surgeon: Marykay Lex, MD;  Location: [facility] CATH LAB;  Service: Cardiovascular;  Laterality: N/A;  . LUMBAR LAMINECTOMY/DECOMPRESSION MICRODISCECTOMY  03/31/2012   Procedure: LUMBAR LAMINECTOMY/DECOMPRESSION MICRODISCECTOMY 1 LEVEL;  Surgeon: Temple Pacini, MD;  Location: [facility] NEURO ORS;  Service: Neurosurgery;  Laterality: Left;  . TRANSTHORACIC ECHOCARDIOGRAM  07/28/2011   EF 55-65%; LVH, grade 1 diastolic dysfunction;     Allergies  Allergen Reactions  . Iohexol Anaphylaxis    PT. TO BE PREMEDICATED PRIOR TO IV CONTRAST PER DR Eppie Gibson /MMS//12/15/15Desc: PT BECAME SOB AND CHEST TIGHTNESS AFTER CONTRAST INJECTION.  [name],RT-RCT., Onset Date: [id number]      Outpatient Medications Prior to Visit  Medication Sig Dispense Refill  . albuterol (PROAIR HFA) 108 (90 Base) MCG/ACT inhaler Inhale 1 puff into the lungs every 6 (six) hours as needed for wheezing or shortness of breath. 1 Inhaler 2  . amLODipine (NORVASC) 10 MG tablet Take 1 tablet (10 mg total) by mouth daily. 90 tablet 1  . aspirin 81 MG tablet Take 1 tablet (81 mg  total) daily by mouth. 30 tablet   . baclofen (LIORESAL) 10 MG tablet Take 1 tablet (10 mg total) by mouth 3 (three) times daily. 90 each 1  . benzonatate (TESSALON) 100 MG capsule Take 1 capsule (100 mg total) by mouth every 8 (eight) hours. 21 capsule 0  . Blood Glucose Monitoring Suppl (ACCU-CHEK AVIVA PLUS) w/Device KIT Use as dircted 1 kit 0  . cetirizine (ZYRTEC) 10 MG tablet Take 1 tablet (10 mg total) daily by mouth. 30 tablet 1  . Cholecalciferol (VITAMIN D) 2000 units tablet Take 1 tablet (2,000 Units total) by mouth daily. 30 tablet 1  . clopidogrel (PLAVIX) 75 MG tablet Take 1 tablet (75 mg total) by mouth daily. YOU MAY RESTART 08/26/2018 90 tablet 1  . diclofenac sodium (VOLTAREN) 1 % GEL Apply 2 g topically 4 (four) times daily. 1 Tube 0  . FLUoxetine (PROZAC) 40 MG capsule Take 1 capsule (40 mg total) by mouth daily. 90 capsule 1  . fluticasone (FLONASE) 50 MCG/ACT nasal spray Place 2 sprays into both nostrils daily. (Patient taking differently: Place 2 sprays into both nostrils daily as needed for allergies. ) 16 g 6  . Fluticasone-Salmeterol (ADVAIR) 100-50 MCG/DOSE AEPB Inhale 1 puff into  the lungs 2 (two) times daily. 1 each 3  . furosemide (LASIX) 20 MG tablet Take 20 mg by mouth as needed.     Marland Kitchen glucose blood (ACCU-CHEK AVIVA) test strip Use as instructed 100 each 12  . hydrALAZINE (APRESOLINE) 25 MG tablet Take 1 tablet (25 mg total) by mouth 2 (two) times daily. 180 tablet 1  . HYDROcodone-acetaminophen (NORCO) 5-325 MG tablet Take 1-2 tablets by mouth every 6 (six) hours as needed for moderate pain. 120 tablet 0  . hydrocortisone-pramoxine (ANALPRAM-HC) 2.5-1 % rectal cream PLACE 1 APPLICATION RECTALLY 3 (THREE) TIMES DAILY. (Patient taking differently: Place 1 application rectally as needed. ) 30 g 1  . isosorbide mononitrate (IMDUR) 120 MG 24 hr tablet Take 1 tablet (120 mg total) by mouth daily. (Patient taking differently: Take 60 mg by mouth 2 (two) times daily. ) 90  tablet 1  . LORazepam (ATIVAN) 0.5 MG tablet 1 tab po q 4-6 hours prn or 1 tab po 30 minutes prior to radiation 30 tablet 0  . losartan-hydrochlorothiazide (HYZAAR) 100-25 MG tablet Take 1 tablet by mouth daily. 90 tablet 1  . metFORMIN (GLUCOPHAGE) 1000 MG tablet Take 1 tablet (1,000 mg total) by mouth 2 (two) times daily with a meal. 180 tablet 1  . mirtazapine (REMERON) 30 MG tablet Take 1 tablet (30 mg total) by mouth at bedtime. 30 tablet 2  . Nebivolol HCl 20 MG TABS Take 1 tablet (20 mg total) by mouth daily. 90 tablet 3  . nicotine (NICODERM CQ) 21 mg/24hr patch Place 1 patch (21 mg total) onto the skin daily. 28 patch 0  . pantoprazole (PROTONIX) 40 MG tablet Take 1 tablet (40 mg total) by mouth 2 (two) times daily. 180 tablet 1  . prazosin (MINIPRESS) 1 MG capsule Take 1 capsule (1 mg total) by mouth at bedtime. For nightmares 30 capsule 2  . prochlorperazine (COMPAZINE) 10 MG tablet Take 1 tablet (10 mg total) by mouth every 6 (six) hours as needed for nausea or vomiting. 30 tablet 0  . ranolazine (RANEXA) 1000 MG SR tablet Take 1 tablet (1,000 mg total) by mouth 2 (two) times daily. (Patient taking differently: Take 1,000 mg by mouth daily. ) 180 tablet 3  . rosuvastatin (CRESTOR) 20 MG tablet Take 1 tablet (20 mg total) by mouth daily. 90 tablet 1  . tamsulosin (FLOMAX) 0.4 MG CAPS capsule Take 1 capsule (0.4 mg total) by mouth daily. 30 capsule 5  . tiotropium (SPIRIVA HANDIHALER) 18 MCG inhalation capsule Place 1 capsule (18 mcg total) into inhaler and inhale daily. 90 capsule 1  . vitamin B-12 (CYANOCOBALAMIN) 500 MCG tablet Take 1 tablet (500 mcg total) by mouth daily. 30 tablet 1  . traZODone (DESYREL) 50 MG tablet Take 1 tablet (50 mg total) by mouth at bedtime as needed. for sleep 30 tablet 5  . nitroGLYCERIN (NITROSTAT) 0.4 MG SL tablet Place 1 tablet (0.4 mg total) under the tongue every 5 (five) minutes as needed for chest pain. (Patient not taking: Reported on 08/29/2018) 25  tablet 2  . acetaminophen-codeine (TYLENOL #3) 300-30 MG tablet Take 1 tablet by mouth every 12 (twelve) hours as needed for moderate pain. (Patient not taking: Reported on 08/29/2018) 60 tablet 0   No facility-administered medications prior to visit.     ROS Review of Systems  Constitutional: Negative for activity change and appetite change.  HENT: Negative for sinus pressure and sore throat.   Eyes: Negative for visual disturbance.  Respiratory: Negative  for cough, chest tightness and shortness of breath.   Cardiovascular: Negative for chest pain and leg swelling.  Gastrointestinal: Negative for abdominal distention, abdominal pain, constipation and diarrhea.  Endocrine: Negative.   Genitourinary: Negative for dysuria.  Musculoskeletal: Negative for joint swelling and myalgias.  Skin: Negative for rash.  Allergic/Immunologic: Negative.   Neurological: Negative for weakness, light-headedness and numbness.  Psychiatric/Behavioral: Positive for dysphoric mood. Negative for suicidal ideas.    Objective:  BP (!) 173/106   Pulse 64   Temp 97.9 F (36.6 C) (Oral)   Ht 5\' 9"  (1.753 m)   Wt 250 lb (113.4 kg)   SpO2 97%   BMI 36.92 kg/m   BP/Weight 09/14/2018 09/13/2018 09/12/2018  Systolic BP 173 116 162  Diastolic BP 106 79 98  Wt. (Lbs) 250 - -  BMI 36.92 - -      Physical Exam  Constitutional: He is oriented to person, place, and time. He appears well-developed and well-nourished.  Neck: No JVD present.  Cardiovascular: Normal rate, normal heart sounds and intact distal pulses.  No murmur heard. Pulmonary/Chest: Effort normal and breath sounds normal. He has no wheezes. He has no rales. He exhibits no tenderness.  Abdominal: Soft. Bowel sounds are normal. He exhibits no distension and no mass. There is no tenderness.  Musculoskeletal: Normal range of motion.  Neurological: He is alert and oriented to person, place, and time.  Skin: Skin is warm and dry.  Psychiatric:    Dysphoric mood    Lab Results  Component Value Date   HGBA1C 5.7 (H) 08/21/2018    Assessment & Plan:   1. Diabetes mellitus due to underlying condition with hyperosmolarity without coma, without long-term current use of insulin (HCC) Controlled with A1c of 5.7 Continue current regimen, diabetic diet - POCT glucose (manual entry)  2. 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) Currently undergoing radiation Followed by Oncology He is in good spirits and is hopeful   3. CAD S/P multiple PCI's With unstable angina Continue medications Followed by Cardiology  4. Other insomnia Uncontrolled on Remeron Commenced Ambien Discussed sleep hygiene - zolpidem (AMBIEN) 5 MG tablet; Take 1 tablet (5 mg total) by mouth at bedtime as needed for sleep.  Dispense: 30 tablet; Refill: 2  5. Anxiety and depression Currently on Remeron Has a good support system   Meds ordered this encounter  Medications  . zolpidem (AMBIEN) 5 MG tablet    Sig: Take 1 tablet (5 mg total) by mouth at bedtime as needed for sleep.    Dispense:  30 tablet    Refill:  2    Follow-up: Return in about 3 months (around 12/15/2018) for follow up of chronic medical conditions.   [name] MD

## 2018-09-14 NOTE — Patient Instructions (Signed)

## 2018-09-14 NOTE — Patient Instructions (Signed)
[facility] Discharge Instructions for Patients Receiving Chemotherapy  Today you received the following chemotherapy agents:  Etoposide  To help prevent nausea and vomiting after your treatment, we encourage you to take your nausea medication as prescribed.   If you develop nausea and vomiting that is not controlled by your nausea medication, call the clinic.   BELOW ARE SYMPTOMS THAT SHOULD BE REPORTED IMMEDIATELY:  *FEVER GREATER THAN 100.5 F  *CHILLS WITH OR WITHOUT FEVER  NAUSEA AND VOMITING THAT IS NOT CONTROLLED WITH YOUR NAUSEA MEDICATION  *UNUSUAL SHORTNESS OF BREATH  *UNUSUAL BRUISING OR BLEEDING  TENDERNESS IN MOUTH AND THROAT WITH OR WITHOUT PRESENCE OF ULCERS  *URINARY PROBLEMS  *BOWEL PROBLEMS  UNUSUAL RASH Items with * indicate a potential emergency and should be followed up as soon as possible.  Feel free to call the clinic should you have any questions or concerns. The clinic phone number is [phone].  Please show the [facility] at check-in to the Emergency Department and triage nurse.

## 2018-09-15 ENCOUNTER — Ambulatory Visit
Admission: RE | Admit: 2018-09-15 | Discharge: 2018-09-15 | Disposition: A | Discharge status: Home / Self Care | Payer: Medicare Other | Source: Ambulatory Visit | Attending: Radiation Oncology | Admitting: Radiation Oncology

## 2018-09-15 DIAGNOSIS — Z510 Encounter for antineoplastic radiation therapy: Secondary | ICD-10-CM | POA: Diagnosis not present

## 2018-09-15 DIAGNOSIS — C3411 Malignant neoplasm of upper lobe, right bronchus or lung: Secondary | ICD-10-CM | POA: Diagnosis not present

## 2018-09-18 ENCOUNTER — Other Ambulatory Visit: Payer: Self-pay | Admitting: Radiology

## 2018-09-18 ENCOUNTER — Ambulatory Visit
Admission: RE | Admit: 2018-09-18 | Discharge: 2018-09-18 | Disposition: A | Discharge status: Home / Self Care | Payer: Medicare Other | Source: Ambulatory Visit | Attending: Radiation Oncology | Admitting: Radiation Oncology

## 2018-09-18 DIAGNOSIS — C3411 Malignant neoplasm of upper lobe, right bronchus or lung: Secondary | ICD-10-CM | POA: Diagnosis not present

## 2018-09-18 DIAGNOSIS — Z510 Encounter for antineoplastic radiation therapy: Secondary | ICD-10-CM | POA: Diagnosis not present

## 2018-09-19 ENCOUNTER — Inpatient Hospital Stay: Payer: Medicare Other

## 2018-09-19 ENCOUNTER — Inpatient Hospital Stay (HOSPITAL_BASED_OUTPATIENT_CLINIC_OR_DEPARTMENT_OTHER): Payer: Medicare Other | Admitting: Internal Medicine

## 2018-09-19 ENCOUNTER — Ambulatory Visit
Admission: RE | Admit: 2018-09-19 | Discharge: 2018-09-19 | Disposition: A | Discharge status: Home / Self Care | Payer: Medicare Other | Source: Ambulatory Visit | Attending: Radiation Oncology | Admitting: Radiation Oncology

## 2018-09-19 ENCOUNTER — Encounter: Payer: Self-pay | Admitting: Internal Medicine

## 2018-09-19 ENCOUNTER — Telehealth: Payer: Self-pay | Admitting: Internal Medicine

## 2018-09-19 VITALS — BP 121/77 | HR 88 | Temp 97.8°F | Resp 18 | Ht 69.0 in | Wt 234.7 lb

## 2018-09-19 DIAGNOSIS — Z5111 Encounter for antineoplastic chemotherapy: Secondary | ICD-10-CM | POA: Diagnosis not present

## 2018-09-19 DIAGNOSIS — R5383 Other fatigue: Secondary | ICD-10-CM

## 2018-09-19 DIAGNOSIS — C3411 Malignant neoplasm of upper lobe, right bronchus or lung: Secondary | ICD-10-CM

## 2018-09-19 DIAGNOSIS — Z716 Tobacco abuse counseling: Secondary | ICD-10-CM

## 2018-09-19 DIAGNOSIS — M25519 Pain in unspecified shoulder: Secondary | ICD-10-CM | POA: Diagnosis not present

## 2018-09-19 DIAGNOSIS — R110 Nausea: Secondary | ICD-10-CM | POA: Diagnosis not present

## 2018-09-19 DIAGNOSIS — Z510 Encounter for antineoplastic radiation therapy: Secondary | ICD-10-CM | POA: Diagnosis not present

## 2018-09-19 DIAGNOSIS — I10 Essential (primary) hypertension: Secondary | ICD-10-CM | POA: Diagnosis not present

## 2018-09-19 DIAGNOSIS — E119 Type 2 diabetes mellitus without complications: Secondary | ICD-10-CM | POA: Diagnosis not present

## 2018-09-19 DIAGNOSIS — J449 Chronic obstructive pulmonary disease, unspecified: Secondary | ICD-10-CM | POA: Diagnosis not present

## 2018-09-19 DIAGNOSIS — I2510 Atherosclerotic heart disease of native coronary artery without angina pectoris: Secondary | ICD-10-CM | POA: Diagnosis not present

## 2018-09-19 LAB — CBC WITH DIFFERENTIAL (CANCER CENTER ONLY)
Abs Immature Granulocytes: 0.03 10*3/uL (ref 0.00–0.07)
BASOS ABS: 0 10*3/uL (ref 0.0–0.1)
BASOS PCT: 1 %
EOS PCT: 2 %
Eosinophils Absolute: 0.1 10*3/uL (ref 0.0–0.5)
HCT: 41.5 % (ref 39.0–52.0)
HEMOGLOBIN: 14 g/dL (ref 13.0–17.0)
Immature Granulocytes: 1 %
LYMPHS PCT: 28 %
Lymphs Abs: 0.8 10*3/uL (ref 0.7–4.0)
MCH: 26.8 pg (ref 26.0–34.0)
MCHC: 33.7 g/dL (ref 30.0–36.0)
MCV: 79.5 fL — ABNORMAL LOW (ref 80.0–100.0)
Monocytes Absolute: 0 10*3/uL — ABNORMAL LOW (ref 0.1–1.0)
Monocytes Relative: 1 %
NEUTROS ABS: 1.9 10*3/uL (ref 1.7–7.7)
NRBC: 0 % (ref 0.0–0.2)
Neutrophils Relative %: 67 %
PLATELETS: 128 10*3/uL — AB (ref 150–400)
RBC: 5.22 MIL/uL (ref 4.22–5.81)
RDW: 12.4 % (ref 11.5–15.5)
WBC: 2.9 10*3/uL — AB (ref 4.0–10.5)

## 2018-09-19 LAB — CMP (CANCER CENTER ONLY)
ALT: 18 U/L (ref 0–44)
AST: 16 U/L (ref 15–41)
Albumin: 3.7 g/dL (ref 3.5–5.0)
Alkaline Phosphatase: 58 U/L (ref 38–126)
Anion gap: 11 (ref 5–15)
BUN: 19 mg/dL (ref 6–20)
CHLORIDE: 101 mmol/L (ref 98–111)
CO2: 29 mmol/L (ref 22–32)
Calcium: 9.2 mg/dL (ref 8.9–10.3)
Creatinine: 1.28 mg/dL — ABNORMAL HIGH (ref 0.61–1.24)
Glucose, Bld: 127 mg/dL — ABNORMAL HIGH (ref 70–99)
POTASSIUM: 3.4 mmol/L — AB (ref 3.5–5.1)
Sodium: 141 mmol/L (ref 135–145)
TOTAL PROTEIN: 7.3 g/dL (ref 6.5–8.1)
Total Bilirubin: 0.5 mg/dL (ref 0.3–1.2)

## 2018-09-19 LAB — MAGNESIUM: MAGNESIUM: 1.8 mg/dL (ref 1.7–2.4)

## 2018-09-19 MED ORDER — FENTANYL 25 MCG/HR TD PT72: 25.0000 ug | MEDICATED_PATCH | TRANSDERMAL | 0 refills | Status: DC

## 2018-09-19 MED ORDER — ONDANSETRON HCL 8 MG PO TABS: 8.0000 mg | ORAL_TABLET | Freq: Three times a day (TID) | ORAL | 0 refills | Status: DC | PRN

## 2018-09-19 MED ORDER — BENZONATATE 100 MG PO CAPS: 100.0000 mg | ORAL_CAPSULE | Freq: Three times a day (TID) | ORAL | 0 refills | Status: DC

## 2018-09-19 MED FILL — BENZONATATE 100 MG CAP: 100 | 7 days supply | Qty: 21 | Fill #0

## 2018-09-19 MED FILL — FENTANYL 25 MCG/HR PT72: 25 | 15 days supply | Qty: 5 | Fill #0

## 2018-09-19 NOTE — Patient Instructions (Signed)
Steps to Quit Smoking Smoking tobacco can be bad for your health. It can also affect almost every organ in your body. Smoking puts you and people around you at risk for many serious long-lasting (chronic) diseases. Quitting smoking is hard, but it is one of the best things that you can do for your health. It is never too late to quit. What are the benefits of quitting smoking? When you quit smoking, you lower your risk for getting serious diseases and conditions. They can include:  Lung cancer or lung disease.  Heart disease.  Stroke.  Heart attack.  Not being able to have children (infertility).  Weak bones (osteoporosis) and broken bones (fractures).  If you have coughing, wheezing, and shortness of breath, those symptoms may get better when you quit. You may also get sick less often. If you are pregnant, quitting smoking can help to lower your chances of having a baby of low birth weight. What can I do to help me quit smoking? Talk with your doctor about what can help you quit smoking. Some things you can do (strategies) include:  Quitting smoking totally, instead of slowly cutting back how much you smoke over a period of time.  Going to in-person counseling. You are more likely to quit if you go to many counseling sessions.  Using resources and support systems, such as: ? Online chats with a counselor. ? Phone quitlines. ? Printed self-help materials. ? Support groups or group counseling. ? Text messaging programs. ? Mobile phone apps or applications.  Taking medicines. Some of these medicines may have nicotine in them. If you are pregnant or breastfeeding, do not take any medicines to quit smoking unless your doctor says it is okay. Talk with your doctor about counseling or other things that can help you.  Talk with your doctor about using more than one strategy at the same time, such as taking medicines while you are also going to in-person counseling. This can help make  quitting easier. What things can I do to make it easier to quit? Quitting smoking might feel very hard at first, but there is a lot that you can do to make it easier. Take these steps:  Talk to your family and friends. Ask them to support and encourage you.  Call phone quitlines, reach out to support groups, or work with a counselor.  Ask people who smoke to not smoke around you.  Avoid places that make you want (trigger) to smoke, such as: ? Bars. ? Parties. ? Smoke-break areas at work.  Spend time with people who do not smoke.  Lower the stress in your life. Stress can make you want to smoke. Try these things to help your stress: ? Getting regular exercise. ? Deep-breathing exercises. ? Yoga. ? Meditating. ? Doing a body scan. To do this, close your eyes, focus on one area of your body at a time from head to toe, and notice which parts of your body are tense. Try to relax the muscles in those areas.  Download or buy apps on your mobile phone or tablet that can help you stick to your quit plan. There are many free apps, such as QuitGuide from the CDC (Centers for Disease Control and Prevention). You can find more support from smokefree.gov and other websites.  This information is not intended to replace advice given to you by your health care provider. Make sure you discuss any questions you have with your health care provider. Document Released: 09/04/2009 Document   Revised: 07/06/2016 Document Reviewed: 03/25/2015 Elsevier Interactive Patient Education  2018 [employer]  

## 2018-09-19 NOTE — Progress Notes (Signed)
[facility] [phone]   [phone]  OFFICE PROGRESS NOTE  [name], MD [place] [address]  DIAGNOSIS: Limited stage (T3, N3, M0) small cell lung cancer presented with right paratracheal mass in addition to right suprahilar mass and lymphadenopathy as well as right cervical lymph node diagnosed in October 2019  PRIOR THERAPY: None  CURRENT THERAPY: systemic chemotherapy with cisplatin 80 mg/M2 on day 1 and etoposide 100 mg/M2 on days 1, 2 and 3 every 3 weeks.  Status post 1 cycle.  This will be concurrent with radiotherapy with the start of cycle #2.  INTERVAL HISTORY: Andrew West 52 y.o. male returns to the clinic today for follow-up visit accompanied by his mother and wife.  The patient is feeling fine today with no specific complaints except for fatigue.  He has few days of nausea after the chemotherapy.  The patient is currently on Compazine with little help of his nausea.  He also continues to complain of pain in the shoulder areas.  He is currently on Norco with mild improvement of his pain.  He is requesting adjustment of his pain medication.  He denied having any fever or chills.  He has no chest pain, shortness of breath, cough or hemoptysis.  He is here today for evaluation and repeat blood work.  MEDICAL HISTORY: Past Medical History:  Diagnosis Date  . Anxiety   . CAD (coronary artery disease)    a. s/p multiple PCIs with last cath 11/2016 with severe multivessel CAD, s/p PCTA to LCx but unable to pass stent  . Chronic leg pain    bilateral  . Chronic lower back pain   . COPD (chronic obstructive pulmonary disease) ([facility])   . Depression   . GERD (gastroesophageal reflux disease)    Takes Dexilant  . HLD (hyperlipidemia)   . Hypertension   . Rhabdomyolysis    h/o, r/t statins  . Sleep apnea    "can't tolerate mask" (12/16/2016)  . Type II diabetes mellitus ([facility])     ALLERGIES:  is allergic to  iohexol.  MEDICATIONS:  Current Outpatient Medications  Medication Sig Dispense Refill  . albuterol (PROAIR HFA) 108 (90 Base) MCG/ACT inhaler Inhale 1 puff into the lungs every 6 (six) hours as needed for wheezing or shortness of breath. 1 Inhaler 2  . amLODipine (NORVASC) 10 MG tablet Take 1 tablet (10 mg total) by mouth daily. 90 tablet 1  . aspirin 81 MG tablet Take 1 tablet (81 mg total) daily by mouth. 30 tablet   . baclofen (LIORESAL) 10 MG tablet Take 1 tablet (10 mg total) by mouth 3 (three) times daily. 90 each 1  . Blood Glucose Monitoring Suppl (ACCU-CHEK AVIVA PLUS) w/Device KIT Use as dircted 1 kit 0  . cetirizine (ZYRTEC) 10 MG tablet Take 1 tablet (10 mg total) daily by mouth. 30 tablet 1  . Cholecalciferol (VITAMIN D) 2000 units tablet Take 1 tablet (2,000 Units total) by mouth daily. 30 tablet 1  . clopidogrel (PLAVIX) 75 MG tablet Take 1 tablet (75 mg total) by mouth daily. YOU MAY RESTART 08/26/2018 90 tablet 1  . diclofenac sodium (VOLTAREN) 1 % GEL Apply 2 g topically 4 (four) times daily. 1 Tube 0  . FLUoxetine (PROZAC) 40 MG capsule Take 1 capsule (40 mg total) by mouth daily. 90 capsule 1  . fluticasone (FLONASE) 50 MCG/ACT nasal spray Place 2 sprays into both nostrils daily. (Patient taking  differently: Place 2 sprays into both nostrils daily as needed for allergies. ) 16 g 6  . Fluticasone-Salmeterol (ADVAIR) 100-50 MCG/DOSE AEPB Inhale 1 puff into the lungs 2 (two) times daily. 1 each 3  . furosemide (LASIX) 20 MG tablet Take 20 mg by mouth as needed.     Marland Kitchen glucose blood (ACCU-CHEK AVIVA) test strip Use as instructed 100 each 12  . hydrALAZINE (APRESOLINE) 25 MG tablet Take 1 tablet (25 mg total) by mouth 2 (two) times daily. 180 tablet 1  . HYDROcodone-acetaminophen (NORCO) 5-325 MG tablet Take 1-2 tablets by mouth every 6 (six) hours as needed for moderate pain. 120 tablet 0  . hydrocortisone-pramoxine (ANALPRAM-HC) 2.5-1 % rectal cream PLACE 1 APPLICATION RECTALLY 3  (THREE) TIMES DAILY. (Patient taking differently: Place 1 application rectally as needed. ) 30 g 1  . isosorbide mononitrate (IMDUR) 120 MG 24 hr tablet Take 1 tablet (120 mg total) by mouth daily. (Patient taking differently: Take 60 mg by mouth 2 (two) times daily. ) 90 tablet 1  . LORazepam (ATIVAN) 0.5 MG tablet 1 tab po q 4-6 hours prn or 1 tab po 30 minutes prior to radiation 30 tablet 0  . losartan-hydrochlorothiazide (HYZAAR) 100-25 MG tablet Take 1 tablet by mouth daily. 90 tablet 1  . metFORMIN (GLUCOPHAGE) 1000 MG tablet Take 1 tablet (1,000 mg total) by mouth 2 (two) times daily with a meal. 180 tablet 1  . mirtazapine (REMERON) 30 MG tablet Take 1 tablet (30 mg total) by mouth at bedtime. 30 tablet 2  . Nebivolol HCl 20 MG TABS Take 1 tablet (20 mg total) by mouth daily. 90 tablet 3  . nicotine (NICODERM CQ) 21 mg/24hr patch Place 1 patch (21 mg total) onto the skin daily. 28 patch 0  . pantoprazole (PROTONIX) 40 MG tablet Take 1 tablet (40 mg total) by mouth 2 (two) times daily. 180 tablet 1  . prazosin (MINIPRESS) 1 MG capsule Take 1 capsule (1 mg total) by mouth at bedtime. For nightmares 30 capsule 2  . prochlorperazine (COMPAZINE) 10 MG tablet Take 1 tablet (10 mg total) by mouth every 6 (six) hours as needed for nausea or vomiting. 30 tablet 0  . ranolazine (RANEXA) 1000 MG SR tablet Take 1 tablet (1,000 mg total) by mouth 2 (two) times daily. (Patient taking differently: Take 1,000 mg by mouth daily. ) 180 tablet 3  . rosuvastatin (CRESTOR) 20 MG tablet Take 1 tablet (20 mg total) by mouth daily. 90 tablet 1  . tamsulosin (FLOMAX) 0.4 MG CAPS capsule Take 1 capsule (0.4 mg total) by mouth daily. 30 capsule 5  . tiotropium (SPIRIVA HANDIHALER) 18 MCG inhalation capsule Place 1 capsule (18 mcg total) into inhaler and inhale daily. 90 capsule 1  . vitamin B-12 (CYANOCOBALAMIN) 500 MCG tablet Take 1 tablet (500 mcg total) by mouth daily. 30 tablet 1  . zolpidem (AMBIEN) 5 MG tablet  Take 1 tablet (5 mg total) by mouth at bedtime as needed for sleep. 30 tablet 2  . benzonatate (TESSALON) 100 MG capsule Take 1 capsule (100 mg total) by mouth every 8 (eight) hours. (Patient not taking: Reported on 09/19/2018) 21 capsule 0  . nitroGLYCERIN (NITROSTAT) 0.4 MG SL tablet Place 1 tablet (0.4 mg total) under the tongue every 5 (five) minutes as needed for chest pain. (Patient not taking: Reported on 08/29/2018) 25 tablet 2   No current facility-administered medications for this visit.     SURGICAL HISTORY:  Past Surgical History:  Procedure Laterality Date  . BACK SURGERY    . BRONCHIAL BIOPSY  08/25/2018   Procedure: BRONCHIAL BIOPSIES;  Surgeon: Garner Nash, DO;  Location: [facility];  Service: Cardiopulmonary;;  . CARDIAC CATHETERIZATION N/A 09/25/2015   Procedure: Left Heart Cath and Coronary Angiography;  Surgeon: Leonie Man, MD;  Location: [facility];  Service: Cardiovascular;  Laterality: N/A;  . CARDIAC CATHETERIZATION N/A 12/16/2016   Procedure: Left Heart Cath and Coronary Angiography;  Surgeon: Leonie Man, MD;  Location: [facility];  Service: Cardiovascular;  Laterality: N/A;  . CARDIAC CATHETERIZATION N/A 12/16/2016   Procedure: Coronary Balloon Angioplasty;  Surgeon: Leonie Man, MD;  Location: [facility];  Service: Cardiovascular;  Laterality: N/A;  . COLONOSCOPY W/ POLYPECTOMY    . CORONARY ANGIOPLASTY  09/25/2015   mid cir & om  . CORONARY ANGIOPLASTY WITH STENT PLACEMENT  10/09/2001   PTCA & stenting of mid AV circumflex; 2.5x58m Pixel stent  . CORONARY ANGIOPLASTY WITH STENT PLACEMENT  12/13/2001   PCI with stent to mid L circumflex, 95% stenosis to 0% residual  . CORONARY ANGIOPLASTY WITH STENT PLACEMENT  10/10/2003   PCI to mid AV circumflex; LAD 30% disease; RCA 100% occluded prox.  . CORONARY ANGIOPLASTY WITH STENT PLACEMENT  09/01/2011   PCI with stenting with bare metal stent to mid AV groove circumflex and PDA    . CORONARY ANGIOPLASTY WITH STENT PLACEMENT  10/17/2011   cutting balloon angioplasty of ostial lateral OM1 branch and bifurcation AV groove circumflex OM junction; stenosis reduced to 0%  . ENDOBRONCHIAL ULTRASOUND Bilateral 08/25/2018   Procedure: ENDOBRONCHIAL ULTRASOUND;  Surgeon: IGarner Nash DO;  Location: [facility];  Service: Cardiopulmonary;  Laterality: Bilateral;  . EXCISIONAL HEMORRHOIDECTOMY    . FINE NEEDLE ASPIRATION  08/25/2018   Procedure: FINE NEEDLE ASPIRATION;  Surgeon: IGarner Nash DO;  Location: [facility];  Service: Cardiopulmonary;;  . FLEXIBLE BRONCHOSCOPY  08/25/2018   Procedure: FLEXIBLE BRONCHOSCOPY;  Surgeon: IGarner Nash DO;  Location: [facility];  Service: Cardiopulmonary;;  . LEFT HEART CATHETERIZATION WITH CORONARY ANGIOGRAM N/A 10/18/2011   Procedure: LEFT HEART CATHETERIZATION WITH CORONARY ANGIOGRAM;  Surgeon: DLeonie Man MD;  [facility];  Service: Cardiovascular;  Laterality: N/A;  . LUMBAR LAMINECTOMY/DECOMPRESSION MICRODISCECTOMY  03/31/2012   Procedure: LUMBAR LAMINECTOMY/DECOMPRESSION MICRODISCECTOMY 1 LEVEL;  Surgeon: HCharlie Pitter MD;  [facility];  Service: Neurosurgery;  Laterality: Left;  . TRANSTHORACIC ECHOCARDIOGRAM  07/28/2011   EF 55-65%; LVH, grade 1 diastolic dysfunction;     REVIEW OF SYSTEMS:  A comprehensive review of systems was negative except for: Constitutional: positive for fatigue Respiratory: positive for dyspnea on exertion Gastrointestinal: positive for nausea   PHYSICAL EXAMINATION: General appearance: alert, cooperative, fatigued and no distress Head: Normocephalic, without obvious abnormality, atraumatic Neck: no adenopathy, no JVD, supple, symmetrical, trachea midline and thyroid not enlarged, symmetric, no tenderness/mass/nodules Lymph nodes: Cervical, supraclavicular, and axillary nodes normal. Resp: clear to auscultation bilaterally Back: symmetric, no curvature. ROM normal.  No CVA tenderness. Cardio: regular rate and rhythm, S1, S2 normal, no murmur, click, rub or gallop GI: soft, non-tender; bowel sounds normal; no masses,  no organomegaly Extremities: extremities normal, atraumatic, no cyanosis or edema  ECOG PERFORMANCE STATUS: 1 - Symptomatic but completely ambulatory  Blood pressure 121/77, pulse 88, temperature 97.8 F (36.6 C), temperature source Oral, resp. rate 18, height _0  (1.753 m), weight 234 lb 11.2 oz (106.5 kg), SpO2 100 %.  LABORATORY DATA: Lab Results  Component Value Date   WBC 2.9 (L) 09/19/2018   HGB 14.0 09/19/2018   HCT 41.5 09/19/2018   MCV 79.5 (L) 09/19/2018   PLT 128 (L) 09/19/2018      Chemistry      Component Value Date/Time   NA 140 09/12/2018 0822   NA 144 07/25/2018 0949   K 3.9 09/12/2018 0822   CL 105 09/12/2018 0822   CO2 26 09/12/2018 0822   BUN 9 09/12/2018 0822   BUN 10 07/25/2018 0949   CREATININE 0.92 09/12/2018 0822   CREATININE 0.96 12/13/2016 1151      Component Value Date/Time   CALCIUM 9.0 09/12/2018 0822   ALKPHOS 64 09/12/2018 0822   AST 16 09/12/2018 0822   ALT 17 09/12/2018 0822   BILITOT 0.5 09/12/2018 1638       RADIOGRAPHIC STUDIES: Mr Jeri Cos GY Contrast  Result Date: 09/06/2018 CLINICAL DATA:  New diagnosis small cell lung cancer. Possible brain Mets. EXAM: MRI HEAD WITHOUT AND WITH CONTRAST TECHNIQUE: Multiplanar, multiecho pulse sequences of the brain and surrounding structures were obtained without and with intravenous contrast. CONTRAST:  10 mL Gadavist. COMPARISON:  CT head 09/18/2014. MR head 06/03/2010. PET scan 08/21/2018. FINDINGS: The patient was unable to remain motionless for the exam. Small or subtle lesions could be overlooked. Brain: No evidence for acute infarction, hemorrhage, mass lesion, hydrocephalus, or extra-axial fluid. Slight premature atrophy. Mild subcortical and periventricular T2 and FLAIR hyperintensities, likely chronic microvascular ischemic change.  Post infusion, no abnormal enhancement of the brain or meninges. Vascular: Flow voids are maintained throughout the carotid, basilar, and vertebral arteries. There are no areas of chronic hemorrhage. Skull and upper cervical spine: Unremarkable visualized calvarium, skullbase, and cervical vertebrae. Pituitary, pineal, cerebellar tonsils unremarkable. No upper cervical cord lesions. Sinuses/Orbits: Chronic maxillary sinusitis.  Negative orbits. Other: No mastoid fluid. Incompletely evaluated cervical adenopathy. Correlate with physical exam. IMPRESSION: Premature atrophy. Mild small vessel disease. No acute intracranial findings. No abnormal postcontrast enhancement to suggest intracranial metastatic disease on this mildly motion degraded scan. Cervical adenopathy, incompletely evaluated, but concerning for metastatic disease. Electronically Signed   By: Staci Righter M.D.   On: 09/06/2018 14:21   Nm Pet Image Initial (pi) Skull Base To Thigh  Result Date: 08/21/2018 CLINICAL DATA:  Initial treatment strategy for right upper lobe mass. EXAM: NUCLEAR MEDICINE PET SKULL BASE TO THIGH TECHNIQUE: 12.3 mCi F-18 FDG was injected intravenously. Full-ring PET imaging was performed from the skull base to thigh after the radiotracer. CT data was obtained and used for attenuation correction and anatomic localization. Fasting blood glucose: 101 mg/dl COMPARISON:  Chest CT 07/31/2018 FINDINGS: Mediastinal blood pool activity: SUV max 3.2 NECK: Hypermetabolic right level II lymph nodes identified with SUV max = 5.3. No hypermetabolic lymphadenopathy in the left neck. Incidental CT findings: none CHEST: 4.2 x 3.3 cm right paratracheal mass is markedly hypermetabolic with SUV max = 65.9. A right suprahilar mass measuring 2.5 x 3.3 cm is also hypermetabolic with SUV max = 93.5. No other hypermetabolic disease in the mediastinum or left hilum. No hypermetabolic axillary lymphadenopathy. Incidental CT findings: Dependent  atelectasis noted in the lower lungs. Centrilobular and paraseptal emphysema evident. ABDOMEN/PELVIS: No abnormal hypermetabolic activity within the liver, pancreas, adrenal glands, or spleen. No hypermetabolic lymph nodes in the abdomen or pelvis. Incidental CT findings: Small right groin hernia contains only fat. Atherosclerotic calcification noted distal abdominal aorta. SKELETON: No focal hypermetabolic activity to suggest skeletal metastasis. Incidental  CT findings: none IMPRESSION: 1. Markedly hypermetabolic right paratracheal and right suprahilar lesions, compatible with malignancy. 2. Small hypermetabolic right cervical level II lymph nodes, suspicious for metastatic disease. 3. No evidence for hypermetabolic disease in the abdomen or pelvis. Electronically Signed   By: Misty Stanley M.D.   On: 08/21/2018 10:00    ASSESSMENT AND PLAN: This is a very pleasant 52 years old African-American male recently diagnosed with limited stage small cell lung cancer and currently undergoing systemic chemotherapy with cisplatin and etoposide status post 1 cycle started last week.  The patient has delayed nausea and vomiting after the treatment.  He is currently on Compazine with no improvement. I recommended for him to start treatment with Zofran 8 mg p.o. every 8 hours as needed for nausea. For pain management I started the patient on fentanyl patch 25 mcg/hour every 3 days in addition to his current breakthrough pain medication with Vicodin. I will see him back for follow-up visit in 2 weeks for evaluation before starting cycle #2. The patient was advised to call immediately if he has any concerning symptoms in the interval. The patient voices understanding of current disease status and treatment options and is in agreement with the current care plan.  All questions were answered. The patient knows to call the clinic with any problems, questions or concerns. We can certainly see the patient much sooner if  necessary.  I spent 10 minutes counseling the patient face to face. The total time spent in the appointment was 15 minutes.  Disclaimer: This note was dictated with voice recognition software. Similar sounding words can inadvertently be transcribed and may not be corrected upon review.

## 2018-09-19 NOTE — Telephone Encounter (Signed)
3 cycles already scheduled per 10/29 los - gave patient Calender per los.

## 2018-09-20 ENCOUNTER — Ambulatory Visit (HOSPITAL_COMMUNITY): Payer: Medicare Other

## 2018-09-20 ENCOUNTER — Other Ambulatory Visit: Payer: Self-pay | Admitting: Internal Medicine

## 2018-09-20 ENCOUNTER — Other Ambulatory Visit (HOSPITAL_COMMUNITY): Payer: Medicare Other

## 2018-09-20 ENCOUNTER — Ambulatory Visit
Admission: RE | Admit: 2018-09-20 | Discharge: 2018-09-20 | Disposition: A | Discharge status: Home / Self Care | Payer: Medicare Other | Source: Ambulatory Visit | Attending: Radiation Oncology | Admitting: Radiation Oncology

## 2018-09-20 DIAGNOSIS — C3411 Malignant neoplasm of upper lobe, right bronchus or lung: Secondary | ICD-10-CM | POA: Diagnosis not present

## 2018-09-20 DIAGNOSIS — Z510 Encounter for antineoplastic radiation therapy: Secondary | ICD-10-CM | POA: Diagnosis not present

## 2018-09-20 MED ORDER — ONDANSETRON HCL 8 MG PO TABS: 8.0000 mg | ORAL_TABLET | Freq: Three times a day (TID) | ORAL | 0 refills | Status: DC | PRN

## 2018-09-20 MED FILL — ONDANSETRON HCL 8 MG TABLET: 8 | 7 days supply | Qty: 20 | Fill #0

## 2018-09-21 ENCOUNTER — Ambulatory Visit
Admission: RE | Admit: 2018-09-21 | Discharge: 2018-09-21 | Disposition: A | Discharge status: Home / Self Care | Payer: Medicare Other | Source: Ambulatory Visit | Attending: Radiation Oncology | Admitting: Radiation Oncology

## 2018-09-21 DIAGNOSIS — Z510 Encounter for antineoplastic radiation therapy: Secondary | ICD-10-CM | POA: Diagnosis not present

## 2018-09-21 DIAGNOSIS — C3411 Malignant neoplasm of upper lobe, right bronchus or lung: Secondary | ICD-10-CM | POA: Diagnosis not present

## 2018-09-22 ENCOUNTER — Ambulatory Visit
Admission: RE | Admit: 2018-09-22 | Discharge: 2018-09-22 | Disposition: A | Discharge status: Home / Self Care | Payer: Medicare Other | Source: Ambulatory Visit | Attending: Radiation Oncology | Admitting: Radiation Oncology

## 2018-09-22 ENCOUNTER — Encounter: Payer: Self-pay | Admitting: Nutrition

## 2018-09-22 DIAGNOSIS — Z510 Encounter for antineoplastic radiation therapy: Secondary | ICD-10-CM | POA: Diagnosis not present

## 2018-09-22 DIAGNOSIS — C3411 Malignant neoplasm of upper lobe, right bronchus or lung: Secondary | ICD-10-CM | POA: Insufficient documentation

## 2018-09-22 NOTE — Progress Notes (Signed)
Provided one complimentary case of Ensure Enlive. Patient scheduled to see RD in infusion for evaluation.

## 2018-09-24 ENCOUNTER — Emergency Department (HOSPITAL_COMMUNITY)
Admission: EM | Admit: 2018-09-24 | Discharge: 2018-09-24 | Disposition: A | Discharge status: Home / Self Care | Payer: Medicare Other | Attending: Emergency Medicine | Admitting: Emergency Medicine

## 2018-09-24 ENCOUNTER — Emergency Department (HOSPITAL_COMMUNITY): Payer: Medicare Other

## 2018-09-24 ENCOUNTER — Other Ambulatory Visit: Payer: Self-pay

## 2018-09-24 ENCOUNTER — Encounter (HOSPITAL_COMMUNITY): Payer: Self-pay | Admitting: Emergency Medicine

## 2018-09-24 DIAGNOSIS — Z87891 Personal history of nicotine dependence: Secondary | ICD-10-CM | POA: Insufficient documentation

## 2018-09-24 DIAGNOSIS — D701 Agranulocytosis secondary to cancer chemotherapy: Secondary | ICD-10-CM | POA: Diagnosis not present

## 2018-09-24 DIAGNOSIS — Z79899 Other long term (current) drug therapy: Secondary | ICD-10-CM | POA: Diagnosis not present

## 2018-09-24 DIAGNOSIS — J029 Acute pharyngitis, unspecified: Secondary | ICD-10-CM | POA: Diagnosis not present

## 2018-09-24 DIAGNOSIS — I2510 Atherosclerotic heart disease of native coronary artery without angina pectoris: Secondary | ICD-10-CM | POA: Insufficient documentation

## 2018-09-24 DIAGNOSIS — C3411 Malignant neoplasm of upper lobe, right bronchus or lung: Secondary | ICD-10-CM | POA: Diagnosis not present

## 2018-09-24 DIAGNOSIS — J449 Chronic obstructive pulmonary disease, unspecified: Secondary | ICD-10-CM | POA: Insufficient documentation

## 2018-09-24 DIAGNOSIS — Z7901 Long term (current) use of anticoagulants: Secondary | ICD-10-CM | POA: Diagnosis not present

## 2018-09-24 DIAGNOSIS — C3491 Malignant neoplasm of unspecified part of right bronchus or lung: Secondary | ICD-10-CM | POA: Diagnosis not present

## 2018-09-24 DIAGNOSIS — I10 Essential (primary) hypertension: Secondary | ICD-10-CM | POA: Diagnosis not present

## 2018-09-24 DIAGNOSIS — E119 Type 2 diabetes mellitus without complications: Secondary | ICD-10-CM | POA: Insufficient documentation

## 2018-09-24 DIAGNOSIS — Z7984 Long term (current) use of oral hypoglycemic drugs: Secondary | ICD-10-CM | POA: Diagnosis not present

## 2018-09-24 DIAGNOSIS — K1231 Oral mucositis (ulcerative) due to antineoplastic therapy: Secondary | ICD-10-CM | POA: Diagnosis not present

## 2018-09-24 DIAGNOSIS — T451X5A Adverse effect of antineoplastic and immunosuppressive drugs, initial encounter: Secondary | ICD-10-CM

## 2018-09-24 LAB — BASIC METABOLIC PANEL
Anion gap: 9 (ref 5–15)
BUN: 14 mg/dL (ref 6–20)
CO2: 32 mmol/L (ref 22–32)
Calcium: 8.9 mg/dL (ref 8.9–10.3)
Chloride: 100 mmol/L (ref 98–111)
Creatinine, Ser: 1.18 mg/dL (ref 0.61–1.24)
GFR calc Af Amer: >60 mL/min (ref >60)
GFR calc non Af Amer: >60 mL/min (ref >60)
Glucose, Bld: 115 mg/dL — ABNORMAL HIGH (ref 70–99)
Potassium: 3.5 mmol/L (ref 3.5–5.1)
Sodium: 141 mmol/L (ref 135–145)

## 2018-09-24 LAB — CBC WITH DIFFERENTIAL/PLATELET
HCT: 35.9 % — ABNORMAL LOW (ref 39.0–52.0)
Hemoglobin: 11.6 g/dL — ABNORMAL LOW (ref 13.0–17.0)
MCH: 26.9 pg (ref 26.0–34.0)
MCHC: 32.3 g/dL (ref 30.0–36.0)
MCV: 83.1 fL (ref 80.0–100.0)
Platelets: 39 10*3/uL — ABNORMAL LOW (ref 150–400)
RBC: 4.32 MIL/uL (ref 4.22–5.81)
RDW: 12 % (ref 11.5–15.5)
WBC: 0.5 10*3/uL — CL (ref 4.0–10.5)
nRBC: 0 % (ref 0.0–0.2)

## 2018-09-24 MED ORDER — SODIUM CHLORIDE 0.9 % IV BOLUS
1000.0000 mL | Freq: Once | INTRAVENOUS | Status: AC
  Administered 2018-09-24: 1000 mL via INTRAVENOUS

## 2018-09-24 MED ORDER — SODIUM CHLORIDE 0.9 % IV SOLN
1.0000 g | Freq: Once | INTRAVENOUS | Status: AC
  Administered 2018-09-24: 1 g via INTRAVENOUS
  Filled 2018-09-24: qty 10

## 2018-09-24 MED ORDER — MAGIC MOUTHWASH: 10.0000 mL | Freq: Three times a day (TID) | ORAL | 0 refills | Status: DC | PRN

## 2018-09-24 MED ORDER — MAGIC MOUTHWASH
10.0000 mL | Freq: Once | ORAL | Status: AC
  Administered 2018-09-24: 10 mL via ORAL
  Filled 2018-09-24: qty 10

## 2018-09-24 MED ORDER — LIDOCAINE VISCOUS HCL 2 % MT SOLN
15.0000 mL | Freq: Once | OROMUCOSAL | Status: AC
  Administered 2018-09-24: 15 mL via OROMUCOSAL
  Filled 2018-09-24: qty 15

## 2018-09-24 NOTE — ED Notes (Signed)
CRITICAL VALUE STICKER  CRITICAL VALUE:  RECEIVER (on-site recipient of call): Bartholome Bill, RN  [place] NOTIFIED: 1525  MESSENGER (representative from lab): William Dalton   MD NOTIFIED: Ralene Bathe  TIME OF NOTIFICATION: 1526  RESPONSE: Awaiting orders

## 2018-09-24 NOTE — Discharge Instructions (Addendum)
Use the Magic mouthwash up to 3 times daily for pain in your mouth and throat.  You can also use warm water salt gargles 3 or 4 times daily.  Try to eat cooler foods instead of warmer or hot foods.  Drink plenty of fluids and get plenty of rest.  Follow-up with your oncologist tomorrow as scheduled.  You likely will not have radiation therapy tomorrow because of your abnormal blood work.  Return to the emergency department immediately for any concerning signs or symptoms develop such as fevers, vomiting, difficulty breathing or swallowing.

## 2018-09-24 NOTE — ED Provider Notes (Signed)
[facility] DEPT Provider Note   CSN: [id number] Arrival date & time: 09/24/18  1343     History   Chief Complaint Chief Complaint  Patient presents with  . Sore Throat    HPI Andrew West is a 52 y.o. male with history of COPD, chronic leg and back pain, GERD, HLD, HTN, type 2 diabetes mellitus, CAD anxiety, and small cell lung cancer presents for evaluation of acute onset, progressively worsening throat pain for 3 days.  He notes a constant 8.5/10 pain to the throat, anterior neck, radiating down the midline "to my stomach".  Endorses nausea but no vomiting.  States he has had decreased oral intake as a result of pain.  Denies chest pain, shortness of breath, or abdominal pain.  No fevers or chills.  Pain worsens with swallowing, coughing, or any attempts to eat although he tolerates cold foods more than hot foods.  Denies throat tightness or drooling. He has tried taking Percocet, Tylenol, fentanyl patches without relief of his symptoms.  He states he is out of his narcotic pain medicines.  States that he has been on chemotherapy and radiation therapy for approximately 3 weeks.  The history is provided by the patient.    Past Medical History:  Diagnosis Date  . Anxiety   . CAD (coronary artery disease)    a. s/p multiple PCIs with last cath 11/2016 with severe multivessel CAD, s/p PCTA to LCx but unable to pass stent  . Chronic leg pain    bilateral  . Chronic lower back pain   . COPD (chronic obstructive pulmonary disease) ([place])   . Depression   . GERD (gastroesophageal reflux disease)    Takes Dexilant  . HLD (hyperlipidemia)   . Hypertension   . Rhabdomyolysis    h/o, r/t statins  . Sleep apnea    "can't tolerate mask" (12/16/2016)  . Type II diabetes mellitus [facility])     Patient Active Problem List   Diagnosis Date Noted  . 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 Goals of care, counseling/discussion  08/31/2018  . Encounter for antineoplastic chemotherapy 08/31/2018  . Encounter for smoking cessation counseling 08/31/2018  . Malignant neoplasm of bronchus of right upper lobe ([place]) 08/29/2018  . Mediastinal mass 08/17/2018  . Vitamin D deficiency 08/04/2018  . Diabetic peripheral neuropathy ([place]) 06/30/2018  . Substernal chest pain 02/25/2018  . Benign prostatic hyperplasia with urinary hesitancy 11/17/2017  . Preoperative cardiovascular examination 07/12/2017  . Refractory angina ([place]) 05/24/2017  . Complex regional pain syndrome type I 03/24/2017  . Progressive angina ([place]) -Class III 12/16/2016  . Anxiety 06/28/2016  . Diastolic dysfunction-grade 2 with EF 60-65% Oct 2016 03/22/2016  . Hypertension 10/03/2015  . CAD S/P multiple PCI's 10/03/2015  . Pulmonary nodule 06/24/2015  . Cough 06/24/2015  . COPD with asthma ([place]) 06/24/2015  . Reactive airway disease 06/04/2015  . DOE (dyspnea on exertion) 04/15/2015  . Lumbar disc herniation with radiculopathy 03/31/2012  . Presence of stent in left circumflex coronary artery 10/18/2011    Class: History of  . TOBACCO ABUSE 02/27/2009  . ABDOMINAL PAIN, LEFT LOWER QUADRANT 12/30/2008  . ABDOMINAL PAIN, EPIGASTRIC 12/05/2008  . Anxiety and depression 09/05/2008  . Hereditary and idiopathic peripheral neuropathy 09/05/2008  . GERD 09/05/2008  . Cervical disc disorder with radiculopathy of cervical region 08/19/2008  . HLD (hyperlipidemia) 04/25/2008    Class: Diagnosis of  . ALLERGIC RHINITIS 04/25/2008  . Backache  04/25/2008  . Chest pain, unspecified 04/25/2008  . COLONIC POLYPS, HX OF 04/25/2008  . Obesity 04/18/2008    Class: Diagnosis of  . ANAL FISSURE, HX OF 04/18/2008  . Diabetes mellitus ([place]) 01/30/2008    Class: History of  . RECTAL BLEEDING 01/30/2008    Past Surgical History:  Procedure Laterality Date  . BACK SURGERY    . BRONCHIAL BIOPSY  08/25/2018   Procedure: BRONCHIAL BIOPSIES;  Surgeon: Garner Nash, DO;  Location: [facility];  Service: Cardiopulmonary;;  . CARDIAC CATHETERIZATION N/A 09/25/2015   Procedure: Left Heart Cath and Coronary Angiography;  Surgeon: Leonie Man, MD;  Location: [facility] CV LAB;  Service: Cardiovascular;  Laterality: N/A;  . CARDIAC CATHETERIZATION N/A 12/16/2016   Procedure: Left Heart Cath and Coronary Angiography;  Surgeon: Leonie Man, MD;  Location: [facility] CV LAB;  Service: Cardiovascular;  Laterality: N/A;  . CARDIAC CATHETERIZATION N/A 12/16/2016   Procedure: Coronary Balloon Angioplasty;  Surgeon: Leonie Man, MD;  Location: [facility] CV LAB;  Service: Cardiovascular;  Laterality: N/A;  . COLONOSCOPY W/ POLYPECTOMY    . CORONARY ANGIOPLASTY  09/25/2015   mid cir & om  . CORONARY ANGIOPLASTY WITH STENT PLACEMENT  10/09/2001   PTCA & stenting of mid AV circumflex; 2.5x20m Pixel stent  . CORONARY ANGIOPLASTY WITH STENT PLACEMENT  12/13/2001   PCI with stent to mid L circumflex, 95% stenosis to 0% residual  . CORONARY ANGIOPLASTY WITH STENT PLACEMENT  10/10/2003   PCI to mid AV circumflex; LAD 30% disease; RCA 100% occluded prox.  . CORONARY ANGIOPLASTY WITH STENT PLACEMENT  09/01/2011   PCI with stenting with bare metal stent to mid AV groove circumflex and PDA  . CORONARY ANGIOPLASTY WITH STENT PLACEMENT  10/17/2011   cutting balloon angioplasty of ostial lateral OM1 branch and bifurcation AV groove circumflex OM junction; stenosis reduced to 0%  . ENDOBRONCHIAL ULTRASOUND Bilateral 08/25/2018   Procedure: ENDOBRONCHIAL ULTRASOUND;  Surgeon: IGarner Nash DO;  [facility]: [facility];  Service: Cardiopulmonary;  Laterality: Bilateral;  . EXCISIONAL HEMORRHOIDECTOMY    . FINE NEEDLE ASPIRATION  08/25/2018   Procedure: FINE NEEDLE ASPIRATION;  Surgeon: IGarner Nash DO;  Location: [facility];  Service: Cardiopulmonary;;  . FLEXIBLE BRONCHOSCOPY  08/25/2018   Procedure: FLEXIBLE BRONCHOSCOPY;  Surgeon: IGarner Nash DO;   Location: [facility];  Service: Cardiopulmonary;;  . LEFT HEART CATHETERIZATION WITH CORONARY ANGIOGRAM N/A 10/18/2011   Procedure: LEFT HEART CATHETERIZATION WITH CORONARY ANGIOGRAM;  Surgeon: DLeonie Man MD;  [facility];  Service: Cardiovascular;  Laterality: N/A;  . LUMBAR LAMINECTOMY/DECOMPRESSION MICRODISCECTOMY  03/31/2012   Procedure: LUMBAR LAMINECTOMY/DECOMPRESSION MICRODISCECTOMY 1 LEVEL;  Surgeon: HCharlie Pitter MD;  [facility];  Service: Neurosurgery;  Laterality: Left;  . TRANSTHORACIC ECHOCARDIOGRAM  07/28/2011   EF 55-65%; LVH, grade 1 diastolic dysfunction;         Home Medications    Prior to Admission medications   Medication Sig Start Date End Date Taking? Authorizing Provider  albuterol (PROAIR HFA) 108 (90 Base) MCG/ACT inhaler Inhale 1 puff into the lungs every 6 (six) hours as needed for wheezing or shortness of breath. 08/29/18  Yes NCharlott Rakes MD  amLODipine (NORVASC) 10 MG tablet Take 1 tablet (10 mg total) by mouth daily. 04/20/18  Yes NCharlott Rakes MD  aspirin 81 MG tablet Take 1 tablet (81 mg total) daily by mouth. 10/10/17  Yes Newlin, Enobong, MD  baclofen (LIORESAL) 10 MG  tablet Take 1 tablet (10 mg total) by mouth 3 (three) times daily. Patient taking differently: Take 10 mg by mouth 3 (three) times daily as needed for muscle spasms.  06/30/18  Yes Jamse Arn, MD  benzonatate (TESSALON) 100 MG capsule Take 1 capsule (100 mg total) by mouth every 8 (eight) hours. 09/19/18  Yes Curt Bears, MD  Blood Glucose Monitoring Suppl (ACCU-CHEK AVIVA PLUS) w/Device KIT Use as dircted 07/12/17  Yes [name], MD  cetirizine (ZYRTEC) 10 MG tablet Take 1 tablet (10 mg total) daily by mouth. 10/10/17  Yes Newlin, Enobong, MD  Cholecalciferol (VITAMIN D) 2000 units tablet Take 1 tablet (2,000 Units total) by mouth daily. 08/04/18  Yes Jamse Arn, MD  clopidogrel (PLAVIX) 75 MG tablet Take 1 tablet (75 mg total) by mouth  daily. YOU MAY RESTART 08/26/2018 08/25/18  Yes Icard, Bradley L, DO  diclofenac sodium (VOLTAREN) 1 % GEL Apply 2 g topically 4 (four) times daily. 04/20/18  Yes [name], MD  FLUoxetine (PROZAC) 40 MG capsule Take 1 capsule (40 mg total) by mouth daily. 04/20/18  Yes Newlin, Charlane Ferretti, MD  fluticasone (FLONASE) 50 MCG/ACT nasal spray Place 2 sprays into both nostrils daily. Patient taking differently: Place 2 sprays into both nostrils daily as needed for allergies.  02/24/17  Yes McClung, Angela M, PA-C  Fluticasone-Salmeterol (ADVAIR) 100-50 MCG/DOSE AEPB Inhale 1 puff into the lungs 2 (two) times daily. 07/25/18  Yes [name], MD  furosemide (LASIX) 20 MG tablet Take 20 mg by mouth as needed for edema.    Yes [provider]  glucose blood (ACCU-CHEK AVIVA) test strip Use as instructed 07/12/17  Yes Newlin, Charlane Ferretti, MD  hydrALAZINE (APRESOLINE) 25 MG tablet Take 1 tablet (25 mg total) by mouth 2 (two) times daily. 04/20/18  Yes [name], MD  HYDROcodone-acetaminophen (NORCO) 5-325 MG tablet Take 1-2 tablets by mouth every 6 (six) hours as needed for moderate pain. 09/06/18  Yes Hayden Pedro, PA-C  hydrocortisone-pramoxine [facility]) 2.5-1 % rectal cream PLACE 1 APPLICATION RECTALLY 3 (THREE) TIMES DAILY. Patient taking differently: Place 1 application rectally as needed.  08/25/17  Yes [name], MD  isosorbide mononitrate (IMDUR) 120 MG 24 hr tablet Take 1 tablet (120 mg total) by mouth daily. 04/20/18  Yes [name], MD  LORazepam (ATIVAN) 0.5 MG tablet 1 tab po q 4-6 hours prn or 1 tab po 30 minutes prior to radiation 08/30/18  Yes Hayden Pedro, PA-C  losartan-hydrochlorothiazide (HYZAAR) 100-25 MG tablet Take 1 tablet by mouth daily. 04/20/18  Yes [name], MD  metFORMIN (GLUCOPHAGE) 1000 MG tablet Take 1 tablet (1,000 mg total) by mouth 2 (two) times daily with a meal. 04/20/18  Yes Newlin, Enobong, MD  mirtazapine (REMERON) 30 MG tablet  Take 1 tablet (30 mg total) by mouth at bedtime. 08/31/18  Yes Curt Bears, MD  Nebivolol HCl 20 MG TABS Take 1 tablet (20 mg total) by mouth daily. 01/26/18  Yes Hilty, Nadean Corwin, MD  nicotine (NICODERM CQ) 21 mg/24hr patch Place 1 patch (21 mg total) onto the skin daily. 08/31/18  Yes Curt Bears, MD  nitroGLYCERIN (NITROSTAT) 0.4 MG SL tablet Place 1 tablet (0.4 mg total) under the tongue every 5 (five) minutes as needed for chest pain. 12/24/16  Yes Strader, [place] M, PA-C  ondansetron (ZOFRAN) 8 MG tablet Take 1 tablet (8 mg total) by mouth every 8 (eight) hours as needed for nausea or vomiting. 09/20/18  Yes Curt Bears, MD  pantoprazole (PROTONIX) 40 MG tablet Take 1 tablet (40 mg total) by mouth 2 (two) times daily. 04/20/18  Yes [name], MD  prazosin (MINIPRESS) 1 MG capsule Take 1 capsule (1 mg total) by mouth at bedtime. For nightmares 07/25/18  Yes [name], MD  prochlorperazine (COMPAZINE) 10 MG tablet Take 1 tablet (10 mg total) by mouth every 6 (six) hours as needed for nausea or vomiting. 08/31/18  Yes Curt Bears, MD  ranolazine (RANEXA) 1000 MG SR tablet Take 1 tablet (1,000 mg total) by mouth 2 (two) times daily. 05/24/17  Yes Hilty, Nadean Corwin, MD  rosuvastatin (CRESTOR) 20 MG tablet Take 1 tablet (20 mg total) by mouth daily. 07/26/18  Yes [name], MD  tamsulosin (FLOMAX) 0.4 MG CAPS capsule Take 1 capsule (0.4 mg total) by mouth daily. 07/25/18  Yes [name], MD  tiotropium (SPIRIVA HANDIHALER) 18 MCG inhalation capsule Place 1 capsule (18 mcg total) into inhaler and inhale daily. 04/20/18  Yes Newlin, Charlane Ferretti, MD  zolpidem (AMBIEN) 5 MG tablet Take 1 tablet (5 mg total) by mouth at bedtime as needed for sleep. 09/14/18  Yes [name], MD  fentaNYL (DURAGESIC - DOSED MCG/HR) 25 MCG/HR patch Place 1 patch (25 mcg total) onto the skin every 3 (three) days. Patient not taking: Reported on 09/24/2018 09/19/18   Curt Bears, MD  magic  mouthwash SOLN Take 10 mLs by mouth 3 (three) times daily as needed for mouth pain. 09/24/18   Lulie Hurd A, PA-C  vitamin B-12 (CYANOCOBALAMIN) 500 MCG tablet Take 1 tablet (500 mcg total) by mouth daily. 06/30/18   Jamse Arn, MD    Family History Family History  Problem Relation Age of Onset  . Heart attack Father   . Hypertension Mother   . Diabetes Mother   . Heart disease Brother        x 3   . Heart attack Brother        deceased  . Hypertension Sister   . Diabetes Sister   . Anesthesia problems Neg Hx   . Hypotension Neg Hx   . Malignant hyperthermia Neg Hx   . Pseudochol deficiency Neg Hx     Social History Social History   Tobacco Use  . Smoking status: Current Some Day Smoker    Packs/day: 0.25    Years: 25.00    Pack years: 6.25    Types: Cigarettes    Last attempt to quit: 05/24/2015    Years since quitting: 3.3  . Smokeless tobacco: Never Used  Substance Use Topics  . Alcohol use: No    Alcohol/week: 0.0 standard drinks  . Drug use: No     Allergies   Iohexol   Review of Systems Review of Systems  Constitutional: Positive for appetite change. Negative for fever.  HENT: Positive for sore throat.   Gastrointestinal: Positive for nausea. Negative for abdominal pain and vomiting.  All other systems reviewed and are negative.    Physical Exam Updated Vital Signs BP 137/90 (BP Location: Left Arm)   Pulse 93   Temp 99.8 F (37.7 C) (Oral)   Resp 16   SpO2 95%   Physical Exam  Constitutional: He appears well-developed and well-nourished. No distress.  HENT:  Head: Normocephalic and atraumatic.  Mouth/Throat: Mucous membranes are dry. No oral lesions. No uvula swelling. No posterior oropharyngeal edema or posterior oropharyngeal erythema.  Pt edentulous. No oral ulcerations noted. No subglossal swelling.  Tolerating secretions without difficulty.  Tenderness to palpation  of the tongue and the anterior aspect of the neck with no swelling or  upper airway stridor.  Eyes: Conjunctivae are normal. Right eye exhibits no discharge. Left eye exhibits no discharge.  Neck: Normal range of motion and phonation normal. Neck supple. No JVD present. No tracheal deviation present. No thyroid mass present.    Cardiovascular: Normal rate.  Pulmonary/Chest: Effort normal and breath sounds normal.  Abdominal: Soft. Bowel sounds are normal. He exhibits no distension. There is no tenderness.  Musculoskeletal: He exhibits no edema.  Neurological: He is alert.  Skin: Skin is warm and dry. No erythema.  Psychiatric: He has a normal mood and affect. His behavior is normal.  Nursing note and vitals reviewed.    ED Treatments / Results  Labs (all labs ordered are listed, but only abnormal results are displayed) Labs Reviewed  BASIC METABOLIC PANEL - Abnormal; Notable for the following components:      Result Value   Glucose, Bld 115 (*)    All other components within normal limits  CBC WITH DIFFERENTIAL/PLATELET - Abnormal; Notable for the following components:   WBC 0.5 (*)    Hemoglobin 11.6 (*)    HCT 35.9 (*)    Platelets 39 (*)    All other components within normal limits    EKG None  Radiology Dg Chest 2 View  Result Date: 09/24/2018 CLINICAL DATA:  Sore throat. Small cell right lung cancer on chemotherapy and radiation therapy. EXAM: CHEST - 2 VIEW COMPARISON:  08/08/2018 chest radiograph. FINDINGS: Normal heart size. Thickening of the right paratracheal stripe is decreased. Otherwise stable mediastinal contour. No pneumothorax. No pleural effusion. Lungs appear clear, with no acute consolidative airspace disease and no pulmonary edema. IMPRESSION: Thickening of the right paratracheal stripe is decreased, compatible with treatment effect. No acute cardiopulmonary disease. Electronically Signed   By: Ilona Sorrel M.D.   On: 09/24/2018 15:59    Procedures Procedures (including critical care time)  Medications Ordered in  ED Medications  cefTRIAXone (ROCEPHIN) 1 g in sodium chloride 0.9 % 100 mL IVPB (has no administration in time range)  lidocaine (XYLOCAINE) 2 % viscous mouth solution 15 mL (15 mLs Mouth/Throat Given 09/24/18 1500)  sodium chloride 0.9 % bolus 1,000 mL (0 mLs Intravenous Stopped 09/24/18 1634)  magic mouthwash (10 mLs Oral Given 09/24/18 1544)     Initial Impression / Assessment and Plan / ED Course  I have reviewed the triage vital signs and the nursing notes.  Pertinent labs & imaging results that were available during my care of the patient were reviewed by me and considered in my medical decision making (see chart for details).     Patient presenting with complaint of sore throat for 3 days.  Last had chemo on Friday.  He is afebrile, vital signs are stable.  He is nontoxic in appearance.  Tolerating secretions without difficulty.  Airway appears patent, no stridor noted or signs of angioedema.  No evidence of thrush or any mucosal lesions noted on examination.  No nuchal rigidity, doubt meningitis in the absence of fever or meningeal signs.  No evidence of peritonsillar abscess, pharyngeal abscess, or other deep space neck infection.  Symptoms consistent with esophagitis versus oral mucositis secondary to chemotherapy.  Lab work significant for neutropenia with WBC count of 0.50, hemoglobin decreased from 14 to 11.6 as compared to lab work 5 days ago.  Platelet count has also dropped from 128 to 39.  Changes likely secondary to chemotherapy and radiation treatment.  Chest x-ray shows thickening of the right paratracheal stripe which is decreased, no evidence of pneumonia or effusion.  Patient was given Magic mouthwash and viscous lidocaine with improvement in his pain.  I spoke with Dr. Audelia Hives with oncology who recommends prophylactic dose of Rocephin in the ED, discharge with Magic mouthwash, and follow-up with Dr. Julien Nordmann as scheduled tomorrow. They will likely hold on radiation therapy while he  receives bone marrow stimulant such as Neulasta.  Patient tolerating p.o. fluids in the ED without difficulty.  Discussed strict ED return precautions.  Patient and patient's wife verbalized understanding of and agreement with plan and patient is stable for discharge home at this time.  Final Clinical Impressions(s) / ED Diagnoses   Final diagnoses:  Sore throat  Mucositis due to chemotherapy  Chemotherapy-induced neutropenia [facility])    ED Discharge Orders         Ordered    magic mouthwash SOLN  3 times daily PRN     09/24/18 1701           Renita Papa, PA-C 09/24/18 1712    Quintella Reichert, MD 10/02/18 [phone]

## 2018-09-24 NOTE — ED Triage Notes (Signed)
Pt c/o sore throat, had chemo / radiation on Friday. Pt has only taken tylenol at 11:30 this morning for pain, he reports he ran out of pain meds.

## 2018-09-25 ENCOUNTER — Telehealth: Payer: Self-pay | Admitting: *Deleted

## 2018-09-25 ENCOUNTER — Ambulatory Visit
Admission: RE | Admit: 2018-09-25 | Discharge: 2018-09-25 | Disposition: A | Discharge status: Home / Self Care | Payer: Medicare Other | Source: Ambulatory Visit | Attending: Radiation Oncology | Admitting: Radiation Oncology

## 2018-09-25 ENCOUNTER — Inpatient Hospital Stay: Payer: Medicare Other | Attending: Internal Medicine

## 2018-09-25 DIAGNOSIS — Z79899 Other long term (current) drug therapy: Secondary | ICD-10-CM | POA: Insufficient documentation

## 2018-09-25 DIAGNOSIS — R5383 Other fatigue: Secondary | ICD-10-CM | POA: Insufficient documentation

## 2018-09-25 DIAGNOSIS — Z510 Encounter for antineoplastic radiation therapy: Secondary | ICD-10-CM | POA: Diagnosis not present

## 2018-09-25 DIAGNOSIS — Z5189 Encounter for other specified aftercare: Secondary | ICD-10-CM | POA: Diagnosis not present

## 2018-09-25 DIAGNOSIS — C3411 Malignant neoplasm of upper lobe, right bronchus or lung: Secondary | ICD-10-CM | POA: Diagnosis not present

## 2018-09-25 DIAGNOSIS — Z5111 Encounter for antineoplastic chemotherapy: Secondary | ICD-10-CM | POA: Insufficient documentation

## 2018-09-25 LAB — CMP (CANCER CENTER ONLY)
ALBUMIN: 3.4 g/dL — AB (ref 3.5–5.0)
ALK PHOS: 53 U/L (ref 38–126)
ALT: 15 U/L (ref 0–44)
AST: 14 U/L — AB (ref 15–41)
Anion gap: 10 (ref 5–15)
BILIRUBIN TOTAL: 0.4 mg/dL (ref 0.3–1.2)
BUN: 11 mg/dL (ref 6–20)
CALCIUM: 9.1 mg/dL (ref 8.9–10.3)
CO2: 31 mmol/L (ref 22–32)
Chloride: 102 mmol/L (ref 98–111)
Creatinine: 1.08 mg/dL (ref 0.61–1.24)
GFR, Est AFR Am: >60 mL/min (ref >60)
GFR, Estimated: >60 mL/min (ref >60)
GLUCOSE: 97 mg/dL (ref 70–99)
POTASSIUM: 3.9 mmol/L (ref 3.5–5.1)
SODIUM: 143 mmol/L (ref 135–145)
TOTAL PROTEIN: 7.2 g/dL (ref 6.5–8.1)

## 2018-09-25 LAB — CBC WITH DIFFERENTIAL (CANCER CENTER ONLY)
BASOS PCT: 2 %
Basophils Absolute: 0 10*3/uL (ref 0.0–0.1)
Eosinophils Absolute: 0 10*3/uL (ref 0.0–0.5)
Eosinophils Relative: 1 %
HEMATOCRIT: 35.4 % — AB (ref 39.0–52.0)
HEMOGLOBIN: 11.6 g/dL — AB (ref 13.0–17.0)
LYMPHS ABS: 0.3 10*3/uL — AB (ref 0.7–4.0)
Lymphocytes Relative: 73 %
MCH: 26.9 pg (ref 26.0–34.0)
MCHC: 32.8 g/dL (ref 30.0–36.0)
MCV: 81.9 fL (ref 80.0–100.0)
MONO ABS: 0 10*3/uL — AB (ref 0.1–1.0)
MONOS PCT: 10 %
NEUTROS ABS: 0.1 10*3/uL — AB (ref 1.7–17.7)
Neutrophils Relative %: 14 %
Platelet Count: 49 10*3/uL — ABNORMAL LOW (ref 150–400)
RBC: 4.32 MIL/uL (ref 4.22–5.81)
RDW: 11.9 % (ref 11.5–15.5)
WBC Count: 0.4 10*3/uL — CL (ref 4.0–10.5)
nRBC: 0 % (ref 0.0–0.2)

## 2018-09-25 LAB — MAGNESIUM: MAGNESIUM: 1.7 mg/dL (ref 1.7–2.4)

## 2018-09-25 NOTE — Telephone Encounter (Signed)
-----   Message from Curt Bears, MD sent at 09/25/2018  1:03 PM EST ----- Neutropenic precautions. ----- Message ----- From: Buel Ream, Lab In [place] Sent: 09/25/2018  10:32 AM EST To: Curt Bears, MD

## 2018-09-25 NOTE — Telephone Encounter (Signed)
Unable to reach pt on cell as vm was full. Called pt home # spoke with woman, lm for pt for Neutropenic precautions. Woman advised she will let him know. No further concerns.

## 2018-09-26 ENCOUNTER — Ambulatory Visit
Admission: RE | Admit: 2018-09-26 | Discharge: 2018-09-26 | Disposition: A | Discharge status: Home / Self Care | Payer: Medicare Other | Source: Ambulatory Visit | Attending: Radiation Oncology | Admitting: Radiation Oncology

## 2018-09-26 DIAGNOSIS — Z510 Encounter for antineoplastic radiation therapy: Secondary | ICD-10-CM | POA: Diagnosis not present

## 2018-09-26 DIAGNOSIS — C3411 Malignant neoplasm of upper lobe, right bronchus or lung: Secondary | ICD-10-CM | POA: Diagnosis not present

## 2018-09-27 ENCOUNTER — Ambulatory Visit
Admission: RE | Admit: 2018-09-27 | Discharge: 2018-09-27 | Disposition: A | Discharge status: Home / Self Care | Payer: Medicare Other | Source: Ambulatory Visit | Attending: Radiation Oncology | Admitting: Radiation Oncology

## 2018-09-27 ENCOUNTER — Encounter: Payer: Medicare Other | Attending: Physical Medicine & Rehabilitation | Admitting: Physical Medicine & Rehabilitation

## 2018-09-27 DIAGNOSIS — E539 Vitamin B deficiency, unspecified: Secondary | ICD-10-CM | POA: Insufficient documentation

## 2018-09-27 DIAGNOSIS — I10 Essential (primary) hypertension: Secondary | ICD-10-CM | POA: Insufficient documentation

## 2018-09-27 DIAGNOSIS — E1140 Type 2 diabetes mellitus with diabetic neuropathy, unspecified: Secondary | ICD-10-CM | POA: Insufficient documentation

## 2018-09-27 DIAGNOSIS — Z87891 Personal history of nicotine dependence: Secondary | ICD-10-CM | POA: Insufficient documentation

## 2018-09-27 DIAGNOSIS — C3411 Malignant neoplasm of upper lobe, right bronchus or lung: Secondary | ICD-10-CM | POA: Diagnosis not present

## 2018-09-27 DIAGNOSIS — F329 Major depressive disorder, single episode, unspecified: Secondary | ICD-10-CM | POA: Insufficient documentation

## 2018-09-27 DIAGNOSIS — I252 Old myocardial infarction: Secondary | ICD-10-CM | POA: Insufficient documentation

## 2018-09-27 DIAGNOSIS — G479 Sleep disorder, unspecified: Secondary | ICD-10-CM | POA: Insufficient documentation

## 2018-09-27 DIAGNOSIS — Z510 Encounter for antineoplastic radiation therapy: Secondary | ICD-10-CM | POA: Diagnosis not present

## 2018-09-27 DIAGNOSIS — J449 Chronic obstructive pulmonary disease, unspecified: Secondary | ICD-10-CM | POA: Insufficient documentation

## 2018-09-27 DIAGNOSIS — G8929 Other chronic pain: Secondary | ICD-10-CM | POA: Insufficient documentation

## 2018-09-27 DIAGNOSIS — E785 Hyperlipidemia, unspecified: Secondary | ICD-10-CM | POA: Insufficient documentation

## 2018-09-27 DIAGNOSIS — K219 Gastro-esophageal reflux disease without esophagitis: Secondary | ICD-10-CM | POA: Insufficient documentation

## 2018-09-27 DIAGNOSIS — R0789 Other chest pain: Secondary | ICD-10-CM | POA: Insufficient documentation

## 2018-09-27 DIAGNOSIS — I2510 Atherosclerotic heart disease of native coronary artery without angina pectoris: Secondary | ICD-10-CM | POA: Insufficient documentation

## 2018-09-27 DIAGNOSIS — M545 Low back pain: Secondary | ICD-10-CM | POA: Insufficient documentation

## 2018-09-27 DIAGNOSIS — F419 Anxiety disorder, unspecified: Secondary | ICD-10-CM | POA: Insufficient documentation

## 2018-09-28 ENCOUNTER — Ambulatory Visit
Admission: RE | Admit: 2018-09-28 | Discharge: 2018-09-28 | Disposition: A | Discharge status: Home / Self Care | Payer: Medicare Other | Source: Ambulatory Visit | Attending: Radiation Oncology | Admitting: Radiation Oncology

## 2018-09-28 ENCOUNTER — Other Ambulatory Visit: Payer: Self-pay | Admitting: Physician Assistant

## 2018-09-28 DIAGNOSIS — C3411 Malignant neoplasm of upper lobe, right bronchus or lung: Secondary | ICD-10-CM | POA: Diagnosis not present

## 2018-09-28 DIAGNOSIS — Z510 Encounter for antineoplastic radiation therapy: Secondary | ICD-10-CM | POA: Diagnosis not present

## 2018-09-29 ENCOUNTER — Other Ambulatory Visit: Payer: Self-pay | Admitting: Radiation Oncology

## 2018-09-29 ENCOUNTER — Ambulatory Visit
Admission: RE | Admit: 2018-09-29 | Discharge: 2018-09-29 | Disposition: A | Discharge status: Home / Self Care | Payer: Medicare Other | Source: Ambulatory Visit | Attending: Radiation Oncology | Admitting: Radiation Oncology

## 2018-09-29 ENCOUNTER — Encounter (HOSPITAL_COMMUNITY): Payer: Self-pay

## 2018-09-29 ENCOUNTER — Other Ambulatory Visit: Payer: Self-pay | Admitting: Internal Medicine

## 2018-09-29 ENCOUNTER — Ambulatory Visit (HOSPITAL_COMMUNITY)
Admission: RE | Admit: 2018-09-29 | Discharge: 2018-09-29 | Disposition: A | Discharge status: Home / Self Care | Payer: Medicare Other | Source: Ambulatory Visit | Attending: Internal Medicine | Admitting: Internal Medicine

## 2018-09-29 DIAGNOSIS — Z510 Encounter for antineoplastic radiation therapy: Secondary | ICD-10-CM | POA: Diagnosis not present

## 2018-09-29 DIAGNOSIS — Z5111 Encounter for antineoplastic chemotherapy: Secondary | ICD-10-CM | POA: Diagnosis not present

## 2018-09-29 DIAGNOSIS — I878 Other specified disorders of veins: Secondary | ICD-10-CM

## 2018-09-29 DIAGNOSIS — Z452 Encounter for adjustment and management of vascular access device: Secondary | ICD-10-CM | POA: Diagnosis not present

## 2018-09-29 DIAGNOSIS — C3411 Malignant neoplasm of upper lobe, right bronchus or lung: Secondary | ICD-10-CM | POA: Diagnosis not present

## 2018-09-29 DIAGNOSIS — C3491 Malignant neoplasm of unspecified part of right bronchus or lung: Secondary | ICD-10-CM | POA: Diagnosis not present

## 2018-09-29 HISTORY — PX: IR IMAGING GUIDED PORT INSERTION: IMG5740

## 2018-09-29 LAB — CBC WITH DIFFERENTIAL/PLATELET
Abs Immature Granulocytes: 0.19 10*3/uL — ABNORMAL HIGH (ref 0.00–0.07)
BASOS ABS: 0 10*3/uL (ref 0.0–0.1)
Basophils Relative: 1 %
EOS ABS: 0 10*3/uL (ref 0.0–0.5)
EOS PCT: 1 %
HCT: 32.5 % — ABNORMAL LOW (ref 39.0–52.0)
Hemoglobin: 10.7 g/dL — ABNORMAL LOW (ref 13.0–17.0)
Immature Granulocytes: 10 %
LYMPHS PCT: 18 %
Lymphs Abs: 0.4 10*3/uL — ABNORMAL LOW (ref 0.7–4.0)
MCH: 27.7 pg (ref 26.0–34.0)
MCHC: 32.9 g/dL (ref 30.0–36.0)
MCV: 84.2 fL (ref 80.0–100.0)
Monocytes Absolute: 0.6 10*3/uL (ref 0.1–1.0)
Monocytes Relative: 29 %
NRBC: 0 % (ref 0.0–0.2)
Neutro Abs: 0.9 10*3/uL — ABNORMAL LOW (ref 1.7–7.7)
Neutrophils Relative %: 41 %
PLATELETS: 198 10*3/uL (ref 150–400)
RBC: 3.86 MIL/uL — AB (ref 4.22–5.81)
RDW: 12.3 % (ref 11.5–15.5)
WBC: 2 10*3/uL — AB (ref 4.0–10.5)

## 2018-09-29 LAB — APTT: aPTT: 28 seconds (ref 24–36)

## 2018-09-29 LAB — PROTIME-INR
INR: 1.04
Prothrombin Time: 13.5 seconds (ref 11.4–15.2)

## 2018-09-29 MED ORDER — LIDOCAINE HCL (PF) 1 % IJ SOLN
INTRAMUSCULAR | Status: AC | PRN
  Administered 2018-09-29: 10 mL

## 2018-09-29 MED ORDER — FENTANYL CITRATE (PF) 100 MCG/2ML IJ SOLN
INTRAMUSCULAR | Status: AC | PRN
  Administered 2018-09-29 (×2): 50 ug via INTRAVENOUS

## 2018-09-29 MED ORDER — HEPARIN SOD (PORK) LOCK FLUSH 100 UNIT/ML IV SOLN
INTRAVENOUS | Status: AC
  Filled 2018-09-29: qty 5

## 2018-09-29 MED ORDER — CEFAZOLIN SODIUM-DEXTROSE 2-4 GM/100ML-% IV SOLN
2.0000 g | Freq: Once | INTRAVENOUS | Status: AC
  Administered 2018-09-29: 2 g via INTRAVENOUS

## 2018-09-29 MED ORDER — LIDOCAINE HCL 1 % IJ SOLN
INTRAMUSCULAR | Status: AC
  Filled 2018-09-29: qty 20

## 2018-09-29 MED ORDER — FENTANYL CITRATE (PF) 100 MCG/2ML IJ SOLN
INTRAMUSCULAR | Status: AC
  Filled 2018-09-29: qty 2

## 2018-09-29 MED ORDER — HYDROCODONE-ACETAMINOPHEN 5-325 MG PO TABS: 1.0000 | ORAL_TABLET | Freq: Four times a day (QID) | ORAL | 0 refills | Status: DC | PRN

## 2018-09-29 MED ORDER — SODIUM CHLORIDE 0.9 % IV SOLN
INTRAVENOUS | Status: DC
  Administered 2018-09-29: 500 mL via INTRAVENOUS

## 2018-09-29 MED ORDER — HEPARIN SOD (PORK) LOCK FLUSH 100 UNIT/ML IV SOLN
INTRAVENOUS | Status: AC | PRN
  Administered 2018-09-29: 500 [IU] via INTRAVENOUS

## 2018-09-29 MED ORDER — MIDAZOLAM HCL 2 MG/2ML IJ SOLN
INTRAMUSCULAR | Status: AC
  Filled 2018-09-29: qty 4

## 2018-09-29 MED ORDER — MIDAZOLAM HCL 2 MG/2ML IJ SOLN
INTRAMUSCULAR | Status: AC | PRN
  Administered 2018-09-29: 2 mg via INTRAVENOUS
  Administered 2018-09-29 (×2): 1 mg via INTRAVENOUS

## 2018-09-29 MED ORDER — SODIUM CHLORIDE 0.9 % IV SOLN: INTRAVENOUS | Status: DC

## 2018-09-29 MED ORDER — CEFAZOLIN SODIUM-DEXTROSE 2-4 GM/100ML-% IV SOLN
INTRAVENOUS | Status: AC
  Administered 2018-09-29: 2 g via INTRAVENOUS
  Filled 2018-09-29: qty 100

## 2018-09-29 MED FILL — HYDROCODON-APAP 5-325: 5-325 | 15 days supply | Qty: 120 | Fill #0

## 2018-09-29 NOTE — Discharge Instructions (Signed)
Implanted Port Insertion, Care After °This sheet gives you information about how to care for yourself after your procedure. Your health care provider may also give you more specific instructions. If you have problems or questions, contact your health care provider. °What can I expect after the procedure? °After your procedure, it is common to have: °· Discomfort at the port insertion site. °· Bruising on the skin over the port. This should improve over 3-4 days. ° °Follow these instructions at home: °Port care °· After your port is placed, you will get a manufacturer's information card. The card has information about your port. Keep this card with you at all times. °· Take care of the port as told by your health care provider. Ask your health care provider if you or a family member can get training for taking care of the port at home. A home health care nurse may also take care of the port. °· Make sure to remember what type of port you have. °Incision care °· Follow instructions from your health care provider about how to take care of your port insertion site. Make sure you: °? Wash your hands with soap and water before you change your bandage (dressing). If soap and water are not available, use hand sanitizer. °? Change your dressing as told by your health care provider. °? Leave stitches (sutures), skin glue, or adhesive strips in place. These skin closures may need to stay in place for 2 weeks or longer. If adhesive strip edges start to loosen and curl up, you may trim the loose edges. Do not remove adhesive strips completely unless your health care provider tells you to do that. °· Check your port insertion site every day for signs of infection. Check for: °? More redness, swelling, or pain. °? More fluid or blood. °? Warmth. °? Pus or a bad smell. °General instructions °· Do not take baths, swim, or use a hot tub until your health care provider approves. °· Do not lift anything that is heavier than 10 lb (4.5  kg) for a week, or as told by your health care provider. °· Ask your health care provider when it is okay to: °? Return to work or school. °? Resume usual physical activities or sports. °· Do not drive for 24 hours if you were given a medicine to help you relax (sedative). °· Take over-the-counter and prescription medicines only as told by your health care provider. °· Wear a medical alert bracelet in case of an emergency. This will tell any health care providers that you have a port. °· Keep all follow-up visits as told by your health care provider. This is important. °Contact a health care provider if: °· You cannot flush your port with saline as directed, or you cannot draw blood from the port. °· You have a fever or chills. °· You have more redness, swelling, or pain around your port insertion site. °· You have more fluid or blood coming from your port insertion site. °· Your port insertion site feels warm to the touch. °· You have pus or a bad smell coming from the port insertion site. °Get help right away if: °· You have chest pain or shortness of breath. °· You have bleeding from your port that you cannot control. °Summary °· Take care of the port as told by your health care provider. °· Change your dressing as told by your health care provider. °· Keep all follow-up visits as told by your health care provider. °  This information is not intended to replace advice given to you by your health care provider. Make sure you discuss any questions you have with your health care provider. Document Released: 08/29/2013 Document Revised: 09/29/2016 Document Reviewed: 09/29/2016 Elsevier Interactive Patient Education  2017 [place] An implanted port is a type of central line that is placed under the skin. Central lines are used to provide IV access when treatment or nutrition needs to be given through a persons veins. Implanted ports are used for long-term IV access. An implanted port  may be placed because:  You need IV medicine that would be irritating to the small veins in your hands or arms.  You need long-term IV medicines, such as antibiotics.  You need IV nutrition for a long period.  You need frequent blood draws for lab tests.  You need dialysis.  Implanted ports are usually placed in the chest area, but they can also be placed in the upper arm, the abdomen, or the leg. An implanted port has two main parts:  Reservoir. The reservoir is round and will appear as a small, raised area under your skin. The reservoir is the part where a needle is inserted to give medicines or draw blood.  Catheter. The catheter is a thin, flexible tube that extends from the reservoir. The catheter is placed into a large vein. Medicine that is inserted into the reservoir goes into the catheter and then into the vein.  How will I care for my incision site? Do not get the incision site wet. Bathe or shower as directed by your health care provider. How is my port accessed? Special steps must be taken to access the port:  Before the port is accessed, a numbing cream can be placed on the skin. This helps numb the skin over the port site.  Your health care provider uses a sterile technique to access the port. ? Your health care provider must put on a mask and sterile gloves. ? The skin over your port is cleaned carefully with an antiseptic and allowed to dry. ? The port is gently pinched between sterile gloves, and a needle is inserted into the port.  Only "non-coring" port needles should be used to access the port. Once the port is accessed, a blood return should be checked. This helps ensure that the port is in the vein and is not clogged.  If your port needs to remain accessed for a constant infusion, a clear (transparent) bandage will be placed over the needle site. The bandage and needle will need to be changed every week, or as directed by your health care provider.  Keep the  bandage covering the needle clean and dry. Do not get it wet. Follow your health care providers instructions on how to take a shower or bath while the port is accessed.  If your port does not need to stay accessed, no bandage is needed over the port.  What is flushing? Flushing helps keep the port from getting clogged. Follow your health care providers instructions on how and when to flush the port. Ports are usually flushed with saline solution or a medicine called heparin. The need for flushing will depend on how the port is used.  If the port is used for intermittent medicines or blood draws, the port will need to be flushed: ? After medicines have been given. ? After blood has been drawn. ? As part of routine maintenance.  If a constant infusion is  running, the port may not need to be flushed.  How long will my port stay implanted? The port can stay in for as long as your health care provider thinks it is needed. When it is time for the port to come out, surgery will be done to remove it. The procedure is similar to the one performed when the port was put in. When should I seek immediate medical care? When you have an implanted port, you should seek immediate medical care if:  You notice a bad smell coming from the incision site.  You have swelling, redness, or drainage at the incision site.  You have more swelling or pain at the port site or the surrounding area.  You have a fever that is not controlled with medicine.  This information is not intended to replace advice given to you by your health care provider. Make sure you discuss any questions you have with your health care provider. Document Released: 11/08/2005 Document Revised: 04/15/2016 Document Reviewed: 07/16/2013 Elsevier Interactive Patient Education  2017 [place]. Moderate Conscious Sedation, Adult, Care After These instructions provide you with information about caring for yourself after your procedure. Your  health care provider may also give you more specific instructions. Your treatment has been planned according to current medical practices, but problems sometimes occur. Call your health care provider if you have any problems or questions after your procedure. What can I expect after the procedure? After your procedure, it is common:  To feel sleepy for several hours.  To feel clumsy and have poor balance for several hours.  To have poor judgment for several hours.  To vomit if you eat too soon.  Follow these instructions at home: For at least 24 hours after the procedure:   Do not: ? Participate in activities where you could fall or become injured. ? Drive. ? Use heavy machinery. ? Drink alcohol. ? Take sleeping pills or medicines that cause drowsiness. ? Make important decisions or sign legal documents. ? Take care of children on your own.  Rest. Eating and drinking  Follow the diet recommended by your health care provider.  If you vomit: ? Drink water, juice, or soup when you can drink without vomiting. ? Make sure you have little or no nausea before eating solid foods. General instructions  Have a responsible adult stay with you until you are awake and alert.  Take over-the-counter and prescription medicines only as told by your health care provider.  If you smoke, do not smoke without supervision.  Keep all follow-up visits as told by your health care provider. This is important. Contact a health care provider if:  You keep feeling nauseous or you keep vomiting.  You feel light-headed.  You develop a rash.  You have a fever. Get help right away if:  You have trouble breathing. This information is not intended to replace advice given to you by your health care provider. Make sure you discuss any questions you have with your health care provider. Document Released: 08/29/2013 Document Revised: 04/12/2016 Document Reviewed: 02/28/2016 Elsevier Interactive  Patient Education  [employer].

## 2018-09-29 NOTE — Consult Note (Signed)
Chief Complaint: Patient was seen in consultation today for Port-A-Cath placement  Referring Physician(s): Mohamed,Mohamed  Supervising Physician: Corrie Mckusick  Patient Status: [facility] - Out-pt  History of Present Illness: Andrew West is a 52 y.o. male, ex-smoker, with history of limited stage (T3, N3,M0)small cell lung cancer who presented with right paratracheal mass in addition to right suprahilar mass and lymphadenopathy as well as right cervical lymph node diagnosed in October 2019.  He has poor venous access and presents today for Port-A-Cath placement for additional treatment.  Past Medical History:  Diagnosis Date  . Anxiety   . CAD (coronary artery disease)    a. s/p multiple PCIs with last cath 11/2016 with severe multivessel CAD, s/p PCTA to LCx but unable to pass stent  . Chronic leg pain    bilateral  . Chronic lower back pain   . COPD (chronic obstructive pulmonary disease) ([place])   . Depression   . GERD (gastroesophageal reflux disease)    Takes Dexilant  . HLD (hyperlipidemia)   . Hypertension   . Rhabdomyolysis    h/o, r/t statins  . Sleep apnea    "can't tolerate mask" (12/16/2016)  . Type II diabetes mellitus ([place])     Past Surgical History:  Procedure Laterality Date  . BACK SURGERY    . BRONCHIAL BIOPSY  08/25/2018   Procedure: BRONCHIAL BIOPSIES;  Surgeon: Garner Nash, DO;  Location: [facility];  Service: Cardiopulmonary;;  . CARDIAC CATHETERIZATION N/A 09/25/2015   Procedure: Left Heart Cath and Coronary Angiography;  Surgeon: Leonie Man, MD;  Location: [place] [facility];  Service: Cardiovascular;  Laterality: N/A;  . CARDIAC CATHETERIZATION N/A 12/16/2016   Procedure: Left Heart Cath and Coronary Angiography;  Surgeon: Leonie Man, MD;  Location: [place] [facility];  Service: Cardiovascular;  Laterality: N/A;  . CARDIAC CATHETERIZATION N/A 12/16/2016   Procedure: Coronary Balloon Angioplasty;  Surgeon: Leonie Man, MD;   Location: [place] [facility];  Service: Cardiovascular;  Laterality: N/A;  . COLONOSCOPY W/ POLYPECTOMY    . CORONARY ANGIOPLASTY  09/25/2015   mid cir & om  . CORONARY ANGIOPLASTY WITH STENT PLACEMENT  10/09/2001   PTCA & stenting of mid AV circumflex; 2.5x4m Pixel stent  . CORONARY ANGIOPLASTY WITH STENT PLACEMENT  12/13/2001   PCI with stent to mid L circumflex, 95% stenosis to 0% residual  . CORONARY ANGIOPLASTY WITH STENT PLACEMENT  10/10/2003   PCI to mid AV circumflex; LAD 30% disease; RCA 100% occluded prox.  . CORONARY ANGIOPLASTY WITH STENT PLACEMENT  09/01/2011   PCI with stenting with bare metal stent to mid AV groove circumflex and PDA  . CORONARY ANGIOPLASTY WITH STENT PLACEMENT  10/17/2011   cutting balloon angioplasty of ostial lateral OM1 branch and bifurcation AV groove circumflex OM junction; stenosis reduced to 0%  . ENDOBRONCHIAL ULTRASOUND Bilateral 08/25/2018   Procedure: ENDOBRONCHIAL ULTRASOUND;  Surgeon: IGarner Nash DO;  Location: [facility];  Service: Cardiopulmonary;  Laterality: Bilateral;  . EXCISIONAL HEMORRHOIDECTOMY    . FINE NEEDLE ASPIRATION  08/25/2018   Procedure: FINE NEEDLE ASPIRATION;  Surgeon: IGarner Nash DO;  Location: [facility];  Service: Cardiopulmonary;;  . FLEXIBLE BRONCHOSCOPY  08/25/2018   Procedure: FLEXIBLE BRONCHOSCOPY;  Surgeon: IGarner Nash DO;  Location: [facility];  Service: Cardiopulmonary;;  . LEFT HEART CATHETERIZATION WITH CORONARY ANGIOGRAM N/A 10/18/2011   Procedure: LEFT HEART CATHETERIZATION WITH CORONARY ANGIOGRAM;  Surgeon: DLeonie Man MD;  [facility];  Service: Cardiovascular;  Laterality: N/A;  . LUMBAR LAMINECTOMY/DECOMPRESSION MICRODISCECTOMY  03/31/2012   Procedure: LUMBAR LAMINECTOMY/DECOMPRESSION MICRODISCECTOMY 1 LEVEL;  Surgeon: Charlie Pitter, MD;  Location: [place] [facility];  Service: Neurosurgery;  Laterality: Left;  . TRANSTHORACIC ECHOCARDIOGRAM  07/28/2011   EF 55-65%; LVH, grade 1  diastolic dysfunction;     Allergies: Iohexol  Medications: Prior to Admission medications   Medication Sig Start Date End Date Taking? Authorizing Provider  albuterol (PROAIR HFA) 108 (90 Base) MCG/ACT inhaler Inhale 1 puff into the lungs every 6 (six) hours as needed for wheezing or shortness of breath. 08/29/18  Yes [name], MD  amLODipine (NORVASC) 10 MG tablet Take 1 tablet (10 mg total) by mouth daily. 04/20/18  Yes [name], MD  aspirin 81 MG tablet Take 1 tablet (81 mg total) daily by mouth. 10/10/17  Yes Newlin, Enobong, MD  baclofen (LIORESAL) 10 MG tablet Take 1 tablet (10 mg total) by mouth 3 (three) times daily. Patient taking differently: Take 10 mg by mouth 3 (three) times daily as needed for muscle spasms.  06/30/18  Yes Jamse Arn, MD  benzonatate (TESSALON) 100 MG capsule Take 1 capsule (100 mg total) by mouth every 8 (eight) hours. 09/19/18  Yes Curt Bears, MD  Blood Glucose Monitoring Suppl (ACCU-CHEK AVIVA PLUS) w/Device KIT Use as dircted 07/12/17  Yes [name], MD  cetirizine (ZYRTEC) 10 MG tablet Take 1 tablet (10 mg total) daily by mouth. 10/10/17  Yes Newlin, Enobong, MD  FLUoxetine (PROZAC) 40 MG capsule Take 1 capsule (40 mg total) by mouth daily. 04/20/18  Yes Newlin, Charlane Ferretti, MD  fluticasone (FLONASE) 50 MCG/ACT nasal spray Place 2 sprays into both nostrils daily. Patient taking differently: Place 2 sprays into both nostrils daily as needed for allergies.  02/24/17  Yes McClung, Angela M, PA-C  Fluticasone-Salmeterol (ADVAIR) 100-50 MCG/DOSE AEPB Inhale 1 puff into the lungs 2 (two) times daily. 07/25/18  Yes [name], MD  glucose blood (ACCU-CHEK AVIVA) test strip Use as instructed 07/12/17  Yes Newlin, Enobong, MD  hydrALAZINE (APRESOLINE) 25 MG tablet Take 1 tablet (25 mg total) by mouth 2 (two) times daily. 04/20/18  Yes [name], MD  isosorbide mononitrate (IMDUR) 120 MG 24 hr tablet Take 1 tablet (120 mg total) by mouth  daily. 04/20/18  Yes [name], MD  LORazepam (ATIVAN) 0.5 MG tablet 1 tab po q 4-6 hours prn or 1 tab po 30 minutes prior to radiation 08/30/18  Yes Hayden Pedro, PA-C  losartan-hydrochlorothiazide (HYZAAR) 100-25 MG tablet Take 1 tablet by mouth daily. 04/20/18  Yes [name], MD  magic mouthwash SOLN Take 10 mLs by mouth 3 (three) times daily as needed for mouth pain. 09/24/18  Yes Fawze, Mina A, PA-C  metFORMIN (GLUCOPHAGE) 1000 MG tablet Take 1 tablet (1,000 mg total) by mouth 2 (two) times daily with a meal. 04/20/18  Yes Newlin, Enobong, MD  mirtazapine (REMERON) 30 MG tablet Take 1 tablet (30 mg total) by mouth at bedtime. 08/31/18  Yes Curt Bears, MD  Nebivolol HCl 20 MG TABS Take 1 tablet (20 mg total) by mouth daily. 01/26/18  Yes Hilty, Nadean Corwin, MD  nicotine (NICODERM CQ) 21 mg/24hr patch Place 1 patch (21 mg total) onto the skin daily. 08/31/18  Yes Curt Bears, MD  ondansetron (ZOFRAN) 8 MG tablet Take 1 tablet (8 mg total) by mouth every 8 (eight) hours as needed for nausea or vomiting. 09/20/18  Yes Curt Bears, MD  pantoprazole ([place])  40 MG tablet Take 1 tablet (40 mg total) by mouth 2 (two) times daily. 04/20/18  Yes [name], MD  prazosin (MINIPRESS) 1 MG capsule Take 1 capsule (1 mg total) by mouth at bedtime. For nightmares 07/25/18  Yes [name], MD  prochlorperazine (COMPAZINE) 10 MG tablet Take 1 tablet (10 mg total) by mouth every 6 (six) hours as needed for nausea or vomiting. 08/31/18  Yes Curt Bears, MD  ranolazine (RANEXA) 1000 MG SR tablet Take 1 tablet (1,000 mg total) by mouth 2 (two) times daily. 05/24/17  Yes Hilty, Nadean Corwin, MD  rosuvastatin (CRESTOR) 20 MG tablet Take 1 tablet (20 mg total) by mouth daily. 07/26/18  Yes [name], MD  tamsulosin (FLOMAX) 0.4 MG CAPS capsule Take 1 capsule (0.4 mg total) by mouth daily. 07/25/18  Yes [name], MD  tiotropium (SPIRIVA HANDIHALER) 18 MCG inhalation capsule  Place 1 capsule (18 mcg total) into inhaler and inhale daily. 04/20/18  Yes [name], MD  vitamin B-12 (CYANOCOBALAMIN) 500 MCG tablet Take 1 tablet (500 mcg total) by mouth daily. 06/30/18  Yes Jamse Arn, MD  zolpidem (AMBIEN) 5 MG tablet Take 1 tablet (5 mg total) by mouth at bedtime as needed for sleep. 09/14/18  Yes [name], MD  Cholecalciferol (VITAMIN D) 2000 units tablet Take 1 tablet (2,000 Units total) by mouth daily. 08/04/18   Jamse Arn, MD  clopidogrel (PLAVIX) 75 MG tablet Take 1 tablet (75 mg total) by mouth daily. YOU MAY RESTART 08/26/2018 08/25/18   Icard, Leory Plowman L, DO  diclofenac sodium (VOLTAREN) 1 % GEL Apply 2 g topically 4 (four) times daily. 04/20/18   [name], MD  fentaNYL (DURAGESIC - DOSED MCG/HR) 25 MCG/HR patch Place 1 patch (25 mcg total) onto the skin every 3 (three) days. Patient not taking: Reported on 09/24/2018 09/19/18   Curt Bears, MD  furosemide (LASIX) 20 MG tablet Take 20 mg by mouth as needed for edema.     [provider]  HYDROcodone-acetaminophen (NORCO) 5-325 MG tablet Take 1-2 tablets by mouth every 6 (six) hours as needed for moderate pain. 09/29/18   Kyung Rudd, MD  hydrocortisone-pramoxine [facility]) 2.5-1 % rectal cream PLACE 1 APPLICATION RECTALLY 3 (THREE) TIMES DAILY. Patient taking differently: Place 1 application rectally as needed.  08/25/17   [name], MD  nitroGLYCERIN (NITROSTAT) 0.4 MG SL tablet Place 1 tablet (0.4 mg total) under the tongue every 5 (five) minutes as needed for chest pain. 12/24/16   Erma Heritage, PA-C     Family History  Problem Relation Age of Onset  . Heart attack Father   . Hypertension Mother   . Diabetes Mother   . Heart disease Brother        x 3   . Heart attack Brother        deceased  . Hypertension Sister   . Diabetes Sister   . Anesthesia problems Neg Hx   . Hypotension Neg Hx   . Malignant hyperthermia Neg Hx   . Pseudochol deficiency Neg  Hx     Social History   Socioeconomic History  . Marital status: Married    Spouse name: Not on file  . Number of children: Not on file  . Years of education: Not on file  . Highest education level: Not on file  Occupational History  . Not on file  Social Needs  . Financial resource strain: Not on file  . Food insecurity:  Worry: Not on file    Inability: Not on file  . Transportation needs:    Medical: No    Non-medical: No  Tobacco Use  . Smoking status: Current Some Day Smoker    Packs/day: 0.25    Years: 25.00    Pack years: 6.25    Types: Cigarettes    Last attempt to quit: 05/24/2015    Years since quitting: 3.3  . Smokeless tobacco: Never Used  Substance and Sexual Activity  . Alcohol use: No    Alcohol/week: 0.0 standard drinks  . Drug use: No  . Sexual activity: Yes  Lifestyle  . Physical activity:    Days per week: Not on file    Minutes per session: Not on file  . Stress: Not on file  Relationships  . Social connections:    Talks on phone: Not on file    Gets together: Not on file    Attends religious service: Not on file    Active member of club or organization: Not on file    Attends meetings of clubs or organizations: Not on file    Relationship status: Not on file  Other Topics Concern  . Not on file  Social History Narrative  . Not on file      Review of Systems denies fever, headache, chest pain, worsening dyspnea, abdominal/back pain, nausea, vomiting or bleeding.  He does have occasional cough.  Vital Signs: Ht _0  (1.753 m)   Wt 233 lb (105.7 kg)   BMI 34.41 kg/m   Physical Exam awake, alert.  Chest with distant breath sounds bilaterally.  Heart with regular rate and rhythm.  Abdomen soft, positive bowel sounds, nontender.  No lower extremity edema  Imaging: Dg Chest 2 View  Result Date: 09/24/2018 CLINICAL DATA:  Sore throat. Small cell right lung cancer on chemotherapy and radiation therapy. EXAM: CHEST - 2 VIEW COMPARISON:   08/08/2018 chest radiograph. FINDINGS: Normal heart size. Thickening of the right paratracheal stripe is decreased. Otherwise stable mediastinal contour. No pneumothorax. No pleural effusion. Lungs appear clear, with no acute consolidative airspace disease and no pulmonary edema. IMPRESSION: Thickening of the right paratracheal stripe is decreased, compatible with treatment effect. No acute cardiopulmonary disease. Electronically Signed   By: Ilona Sorrel M.D.   On: 09/24/2018 15:59   Mr Jeri Cos ZD Contrast  Result Date: 09/06/2018 CLINICAL DATA:  New diagnosis small cell lung cancer. Possible brain Mets. EXAM: MRI HEAD WITHOUT AND WITH CONTRAST TECHNIQUE: Multiplanar, multiecho pulse sequences of the brain and surrounding structures were obtained without and with intravenous contrast. CONTRAST:  10 mL Gadavist. COMPARISON:  CT head 09/18/2014. MR head 06/03/2010. PET scan 08/21/2018. FINDINGS: The patient was unable to remain motionless for the exam. Small or subtle lesions could be overlooked. Brain: No evidence for acute infarction, hemorrhage, mass lesion, hydrocephalus, or extra-axial fluid. Slight premature atrophy. Mild subcortical and periventricular T2 and FLAIR hyperintensities, likely chronic microvascular ischemic change. Post infusion, no abnormal enhancement of the brain or meninges. Vascular: Flow voids are maintained throughout the carotid, basilar, and vertebral arteries. There are no areas of chronic hemorrhage. Skull and upper cervical spine: Unremarkable visualized calvarium, skullbase, and cervical vertebrae. Pituitary, pineal, cerebellar tonsils unremarkable. No upper cervical cord lesions. Sinuses/Orbits: Chronic maxillary sinusitis.  Negative orbits. Other: No mastoid fluid. Incompletely evaluated cervical adenopathy. Correlate with physical exam. IMPRESSION: Premature atrophy. Mild small vessel disease. No acute intracranial findings. No abnormal postcontrast enhancement to suggest  intracranial metastatic disease on  this mildly motion degraded scan. Cervical adenopathy, incompletely evaluated, but concerning for metastatic disease. Electronically Signed   By: Staci Righter M.D.   On: 09/06/2018 14:21    Labs:  CBC: Recent Labs    09/19/18 1148 09/24/18 1428 09/25/18 1016 09/29/18 1339  WBC 2.9* 0.5* 0.4* 2.0*  HGB 14.0 11.6* 11.6* 10.7*  HCT 41.5 35.9* 35.4* 32.5*  PLT 128* 39* 49* 198    COAGS: Recent Labs    08/17/18 1201 09/29/18 1339  INR 1.1* 1.04  APTT 33.6* 28    BMP: Recent Labs    09/12/18 0822 09/19/18 1148 09/24/18 1428 09/25/18 1016  NA 140 141 141 143  K 3.9 3.4* 3.5 3.9  CL 105 101 100 102  CO2 26 29 32 31  GLUCOSE 108* 127* 115* 97  BUN _0 CALCIUM 9.0 9.2 8.9 9.1  CREATININE 0.92 1.28* 1.18 1.08  GFRNONAA >60 >60 >60 >60  GFRAA >60 >60 >60 >60    LIVER FUNCTION TESTS: Recent Labs    08/31/18 1325 09/12/18 0822 09/19/18 1148 09/25/18 1016  BILITOT 0.4 0.5 0.5 0.4  AST _1 14*  ALT _2 ALKPHOS 65 64 58 53  PROT 7.1 7.5 7.3 7.2  ALBUMIN 3.8 3.6 3.7 3.4*    TUMOR MARKERS: No results for input(s): AFPTM, CEA, CA199, CHROMGRNA in the last 8760 hours.  Assessment and Plan: 52 y.o. male, ex-smoker, with history of limited stage (T3, N3,M0)small cell lung cancer who presented with right paratracheal mass in addition to right suprahilar mass and lymphadenopathy as well as right cervical lymph node diagnosed in October 2019.  He has poor venous access and presents today for Port-A-Cath placement for additional treatment.Risks and benefits of image guided port-a-catheter placement was discussed with the patient/spouse including, but not limited to bleeding, infection, pneumothorax, or fibrin sheath development and need for additional procedures.  All of the patient's questions were answered, patient is agreeable to proceed. Consent signed and in chart.     Thank you for this interesting  consult.  I greatly enjoyed meeting Andrew West and look forward to participating in their care.  A copy of this report was sent to the requesting provider on this date.  Electronically Signed: D. Rowe Robert, PA-C 09/29/2018, 2:49 PM   I spent a total of 25 minutes    in face to face in clinical consultation, greater than 50% of which was counseling/coordinating care for port a cath placement

## 2018-09-29 NOTE — Procedures (Signed)
Interventional Radiology Procedure Note  Procedure: Placement of a left IJ approach single lumen PowerPort.  Tip is positioned at the superior cavoatrial junction and catheter is ready for immediate use.  Complications: None Recommendations:  - Ok to shower tomorrow - Do not submerge for 7 days - Routine line care   Signed,  Dulcy Fanny. Earleen Newport, DO

## 2018-10-02 ENCOUNTER — Other Ambulatory Visit: Payer: Self-pay | Admitting: Internal Medicine

## 2018-10-02 ENCOUNTER — Encounter: Payer: Self-pay | Admitting: Internal Medicine

## 2018-10-02 ENCOUNTER — Ambulatory Visit
Admission: RE | Admit: 2018-10-02 | Discharge: 2018-10-02 | Disposition: A | Discharge status: Home / Self Care | Payer: Medicare Other | Source: Ambulatory Visit | Attending: Radiation Oncology | Admitting: Radiation Oncology

## 2018-10-02 ENCOUNTER — Inpatient Hospital Stay: Payer: Medicare Other

## 2018-10-02 ENCOUNTER — Inpatient Hospital Stay (HOSPITAL_BASED_OUTPATIENT_CLINIC_OR_DEPARTMENT_OTHER): Payer: Medicare Other | Admitting: Internal Medicine

## 2018-10-02 VITALS — BP 126/85 | HR 97 | Temp 98.3°F | Resp 18 | Ht 69.0 in | Wt 228.2 lb

## 2018-10-02 DIAGNOSIS — Z510 Encounter for antineoplastic radiation therapy: Secondary | ICD-10-CM | POA: Diagnosis not present

## 2018-10-02 DIAGNOSIS — Z5189 Encounter for other specified aftercare: Secondary | ICD-10-CM | POA: Diagnosis not present

## 2018-10-02 DIAGNOSIS — Z79899 Other long term (current) drug therapy: Secondary | ICD-10-CM | POA: Diagnosis not present

## 2018-10-02 DIAGNOSIS — Z5111 Encounter for antineoplastic chemotherapy: Secondary | ICD-10-CM | POA: Diagnosis not present

## 2018-10-02 DIAGNOSIS — C3411 Malignant neoplasm of upper lobe, right bronchus or lung: Secondary | ICD-10-CM

## 2018-10-02 DIAGNOSIS — R5383 Other fatigue: Secondary | ICD-10-CM

## 2018-10-02 LAB — CBC WITH DIFFERENTIAL (CANCER CENTER ONLY)
Abs Immature Granulocytes: 0.27 10*3/uL — ABNORMAL HIGH (ref 0.00–0.07)
BASOS PCT: 0 %
Basophils Absolute: 0 10*3/uL (ref 0.0–0.1)
EOS ABS: 0 10*3/uL (ref 0.0–0.5)
EOS PCT: 0 %
HCT: 34.2 % — ABNORMAL LOW (ref 39.0–52.0)
Hemoglobin: 11.3 g/dL — ABNORMAL LOW (ref 13.0–17.0)
Immature Granulocytes: 6 %
LYMPHS PCT: 9 %
Lymphs Abs: 0.4 10*3/uL — ABNORMAL LOW (ref 0.7–4.0)
MCH: 27.6 pg (ref 26.0–34.0)
MCHC: 33 g/dL (ref 30.0–36.0)
MCV: 83.4 fL (ref 80.0–100.0)
MONO ABS: 1.1 10*3/uL — AB (ref 0.1–1.0)
Monocytes Relative: 23 %
NRBC: 0 % (ref 0.0–0.2)
Neutro Abs: 2.8 10*3/uL (ref 1.7–7.7)
Neutrophils Relative %: 62 %
PLATELETS: 332 10*3/uL (ref 150–400)
RBC: 4.1 MIL/uL — AB (ref 4.22–5.81)
RDW: 12.4 % (ref 11.5–15.5)
WBC Count: 4.6 10*3/uL (ref 4.0–10.5)

## 2018-10-02 LAB — CMP (CANCER CENTER ONLY)
ALT: 12 U/L (ref 0–44)
ANION GAP: 9 (ref 5–15)
AST: 14 U/L — ABNORMAL LOW (ref 15–41)
Albumin: 3 g/dL — ABNORMAL LOW (ref 3.5–5.0)
Alkaline Phosphatase: 50 U/L (ref 38–126)
BUN: 11 mg/dL (ref 6–20)
CALCIUM: 9.4 mg/dL (ref 8.9–10.3)
CHLORIDE: 104 mmol/L (ref 98–111)
CO2: 30 mmol/L (ref 22–32)
CREATININE: 0.97 mg/dL (ref 0.61–1.24)
Glucose, Bld: 101 mg/dL — ABNORMAL HIGH (ref 70–99)
Potassium: 3.6 mmol/L (ref 3.5–5.1)
Sodium: 143 mmol/L (ref 135–145)
Total Bilirubin: 0.3 mg/dL (ref 0.3–1.2)
Total Protein: 7.2 g/dL (ref 6.5–8.1)

## 2018-10-02 LAB — MAGNESIUM: MAGNESIUM: 1.7 mg/dL (ref 1.7–2.4)

## 2018-10-02 NOTE — Progress Notes (Signed)
[place] [phone]   [phone]  OFFICE PROGRESS NOTE  [name], MD [place] [address]  DIAGNOSIS: Limited stage (T3, N3, M0) small cell lung cancer presented with right paratracheal mass in addition to right suprahilar mass and lymphadenopathy as well as right cervical lymph node diagnosed in October 2019  PRIOR THERAPY: None  CURRENT THERAPY: systemic chemotherapy with cisplatin 80 mg/M2 on day 1 and etoposide 100 mg/M2 on days 1, 2 and 3 every 3 weeks.  Status post 1 cycle.  This will be concurrent with radiotherapy with the start of cycle #2.  INTERVAL HISTORY: Andrew West 52 y.o. male returns to the clinic today for follow-up visit accompanied by his wife.  The patient is feeling fine today with no concerning complaints.  He tolerated the first cycle of his treatment well.  He denied having any chest pain, shortness of breath, cough or hemoptysis.  He denied having any fever or chills.  He has no nausea, vomiting, diarrhea or constipation.  The patient is here today for evaluation before starting cycle #2.  MEDICAL HISTORY: Past Medical History:  Diagnosis Date  . Anxiety   . CAD (coronary artery disease)    a. s/p multiple PCIs with last cath 11/2016 with severe multivessel CAD, s/p PCTA to LCx but unable to pass stent  . Chronic leg pain    bilateral  . Chronic lower back pain   . COPD (chronic obstructive pulmonary disease) ([facility])   . Depression   . GERD (gastroesophageal reflux disease)    Takes Dexilant  . HLD (hyperlipidemia)   . Hypertension   . Rhabdomyolysis    h/o, r/t statins  . Sleep apnea    "can't tolerate mask" (12/16/2016)  . Type II diabetes mellitus (H[facility])     ALLERGIES:  is allergic to iohexol.  MEDICATIONS:  Current Outpatient Medications  Medication Sig Dispense Refill  . albuterol (PROAIR HFA) 108 (90 Base) MCG/ACT inhaler Inhale 1 puff into the lungs every 6 (six)  hours as needed for wheezing or shortness of breath. 1 Inhaler 2  . amLODipine (NORVASC) 10 MG tablet Take 1 tablet (10 mg total) by mouth daily. 90 tablet 1  . aspirin 81 MG tablet Take 1 tablet (81 mg total) daily by mouth. 30 tablet   . baclofen (LIORESAL) 10 MG tablet Take 1 tablet (10 mg total) by mouth 3 (three) times daily. (Patient taking differently: Take 10 mg by mouth 3 (three) times daily as needed for muscle spasms. ) 90 each 1  . benzonatate (TESSALON) 100 MG capsule Take 1 capsule (100 mg total) by mouth every 8 (eight) hours. 21 capsule 0  . Blood Glucose Monitoring Suppl (ACCU-CHEK AVIVA PLUS) w/Device KIT Use as dircted 1 kit 0  . Cholecalciferol (VITAMIN D) 2000 units tablet Take 1 tablet (2,000 Units total) by mouth daily. 30 tablet 1  . diclofenac sodium (VOLTAREN) 1 % GEL Apply 2 g topically 4 (four) times daily. 1 Tube 0  . FLUoxetine (PROZAC) 40 MG capsule Take 1 capsule (40 mg total) by mouth daily. 90 capsule 1  . fluticasone (FLONASE) 50 MCG/ACT nasal spray Place 2 sprays into both nostrils daily. (Patient taking differently: Place 2 sprays into both nostrils daily as needed for allergies. ) 16 g 6  . Fluticasone-Salmeterol (ADVAIR) 100-50 MCG/DOSE AEPB Inhale 1 puff into the lungs 2 (two) times daily. 1 each 3  . furosemide (LASIX) 20 MG  tablet Take 20 mg by mouth as needed for edema.     Marland Kitchen glucose blood (ACCU-CHEK AVIVA) test strip Use as instructed 100 each 12  . hydrALAZINE (APRESOLINE) 25 MG tablet Take 1 tablet (25 mg total) by mouth 2 (two) times daily. 180 tablet 1  . HYDROcodone-acetaminophen (NORCO) 5-325 MG tablet Take 1-2 tablets by mouth every 6 (six) hours as needed for moderate pain. 120 tablet 0  . hydrocortisone-pramoxine (ANALPRAM-HC) 2.5-1 % rectal cream PLACE 1 APPLICATION RECTALLY 3 (THREE) TIMES DAILY. (Patient taking differently: Place 1 application rectally as needed. ) 30 g 1  . isosorbide mononitrate (IMDUR) 120 MG 24 hr tablet Take 1 tablet (120  mg total) by mouth daily. 90 tablet 1  . LORazepam (ATIVAN) 0.5 MG tablet 1 tab po q 4-6 hours prn or 1 tab po 30 minutes prior to radiation 30 tablet 0  . losartan-hydrochlorothiazide (HYZAAR) 100-25 MG tablet Take 1 tablet by mouth daily. 90 tablet 1  . magic mouthwash SOLN Take 10 mLs by mouth 3 (three) times daily as needed for mouth pain. 100 mL 0  . metFORMIN (GLUCOPHAGE) 1000 MG tablet Take 1 tablet (1,000 mg total) by mouth 2 (two) times daily with a meal. 180 tablet 1  . mirtazapine (REMERON) 30 MG tablet Take 1 tablet (30 mg total) by mouth at bedtime. 30 tablet 2  . Nebivolol HCl 20 MG TABS Take 1 tablet (20 mg total) by mouth daily. 90 tablet 3  . nicotine (NICODERM CQ) 21 mg/24hr patch Place 1 patch (21 mg total) onto the skin daily. 28 patch 0  . ondansetron (ZOFRAN) 8 MG tablet Take 1 tablet (8 mg total) by mouth every 8 (eight) hours as needed for nausea or vomiting. 20 tablet 0  . pantoprazole (PROTONIX) 40 MG tablet Take 1 tablet (40 mg total) by mouth 2 (two) times daily. 180 tablet 1  . prazosin (MINIPRESS) 1 MG capsule Take 1 capsule (1 mg total) by mouth at bedtime. For nightmares 30 capsule 2  . ranolazine (RANEXA) 1000 MG SR tablet Take 1 tablet (1,000 mg total) by mouth 2 (two) times daily. 180 tablet 3  . rosuvastatin (CRESTOR) 20 MG tablet Take 1 tablet (20 mg total) by mouth daily. 90 tablet 1  . sucralfate (CARAFATE) 1 g tablet Take 1 tablet by mouth 4 (four) times daily -  before meals and at bedtime.  0  . tamsulosin (FLOMAX) 0.4 MG CAPS capsule Take 1 capsule (0.4 mg total) by mouth daily. 30 capsule 5  . tiotropium (SPIRIVA HANDIHALER) 18 MCG inhalation capsule Place 1 capsule (18 mcg total) into inhaler and inhale daily. 90 capsule 1  . vitamin B-12 (CYANOCOBALAMIN) 500 MCG tablet Take 1 tablet (500 mcg total) by mouth daily. 30 tablet 1  . zolpidem (AMBIEN) 5 MG tablet Take 1 tablet (5 mg total) by mouth at bedtime as needed for sleep. 30 tablet 2  . cetirizine  (ZYRTEC) 10 MG tablet Take 1 tablet (10 mg total) daily by mouth. (Patient not taking: Reported on 10/02/2018) 30 tablet 1  . clopidogrel (PLAVIX) 75 MG tablet Take 1 tablet (75 mg total) by mouth daily. YOU MAY RESTART 08/26/2018 (Patient not taking: Reported on 10/02/2018) 90 tablet 1  . nitroGLYCERIN (NITROSTAT) 0.4 MG SL tablet Place 1 tablet (0.4 mg total) under the tongue every 5 (five) minutes as needed for chest pain. (Patient not taking: Reported on 10/02/2018) 25 tablet 2  . prochlorperazine (COMPAZINE) 10 MG tablet Take 1 tablet (  10 mg total) by mouth every 6 (six) hours as needed for nausea or vomiting. (Patient not taking: Reported on 10/02/2018) 30 tablet 0   No current facility-administered medications for this visit.     SURGICAL HISTORY:  Past Surgical History:  Procedure Laterality Date  . BACK SURGERY    . BRONCHIAL BIOPSY  08/25/2018   Procedure: BRONCHIAL BIOPSIES;  Surgeon: Garner Nash, DO;  Location: [facility];  Service: Cardiopulmonary;;  . CARDIAC CATHETERIZATION N/A 09/25/2015   Procedure: Left Heart Cath and Coronary Angiography;  Surgeon: Leonie Man, MD;  Location: [facility];  Service: Cardiovascular;  Laterality: N/A;  . CARDIAC CATHETERIZATION N/A 12/16/2016   Procedure: Left Heart Cath and Coronary Angiography;  Surgeon: Leonie Man, MD;  Location: [facility];  Service: Cardiovascular;  Laterality: N/A;  . CARDIAC CATHETERIZATION N/A 12/16/2016   Procedure: Coronary Balloon Angioplasty;  Surgeon: Leonie Man, MD;  Location: [facility];  Service: Cardiovascular;  Laterality: N/A;  . COLONOSCOPY W/ POLYPECTOMY    . CORONARY ANGIOPLASTY  09/25/2015   mid cir & om  . CORONARY ANGIOPLASTY WITH STENT PLACEMENT  10/09/2001   PTCA & stenting of mid AV circumflex; 2.5x68m Pixel stent  . CORONARY ANGIOPLASTY WITH STENT PLACEMENT  12/13/2001   PCI with stent to mid L circumflex, 95% stenosis to 0% residual  . CORONARY ANGIOPLASTY  WITH STENT PLACEMENT  10/10/2003   PCI to mid AV circumflex; LAD 30% disease; RCA 100% occluded prox.  . CORONARY ANGIOPLASTY WITH STENT PLACEMENT  09/01/2011   PCI with stenting with bare metal stent to mid AV groove circumflex and PDA  . CORONARY ANGIOPLASTY WITH STENT PLACEMENT  10/17/2011   cutting balloon angioplasty of ostial lateral OM1 branch and bifurcation AV groove circumflex OM junction; stenosis reduced to 0%  . ENDOBRONCHIAL ULTRASOUND Bilateral 08/25/2018   Procedure: ENDOBRONCHIAL ULTRASOUND;  Surgeon: IGarner Nash DO;  Location: [facility];  Service: Cardiopulmonary;  Laterality: Bilateral;  . EXCISIONAL HEMORRHOIDECTOMY    . FINE NEEDLE ASPIRATION  08/25/2018   Procedure: FINE NEEDLE ASPIRATION;  Surgeon: IGarner Nash DO;  Location: [facility];  Service: Cardiopulmonary;;  . FLEXIBLE BRONCHOSCOPY  08/25/2018   Procedure: FLEXIBLE BRONCHOSCOPY;  Surgeon: IGarner Nash DO;  Location: [facility];  Service: Cardiopulmonary;;  . IR IMAGING GUIDED PORT INSERTION  09/29/2018  . LEFT HEART CATHETERIZATION WITH CORONARY ANGIOGRAM N/A 10/18/2011   Procedure: LEFT HEART CATHETERIZATION WITH CORONARY ANGIOGRAM;  Surgeon: DLeonie Man MD;  [facility];  Service: Cardiovascular;  Laterality: N/A;  . LUMBAR LAMINECTOMY/DECOMPRESSION MICRODISCECTOMY  03/31/2012   Procedure: LUMBAR LAMINECTOMY/DECOMPRESSION MICRODISCECTOMY 1 LEVEL;  Surgeon: HCharlie Pitter MD;  [facility];  Service: Neurosurgery;  Laterality: Left;  . TRANSTHORACIC ECHOCARDIOGRAM  07/28/2011   EF 55-65%; LVH, grade 1 diastolic dysfunction;     REVIEW OF SYSTEMS:  A comprehensive review of systems was negative except for: Constitutional: positive for fatigue   PHYSICAL EXAMINATION: General appearance: alert, cooperative, fatigued and no distress Head: Normocephalic, without obvious abnormality, atraumatic Neck: no adenopathy, no JVD, supple, symmetrical, trachea midline and thyroid not  enlarged, symmetric, no tenderness/mass/nodules Lymph nodes: Cervical, supraclavicular, and axillary nodes normal. Resp: clear to auscultation bilaterally Back: symmetric, no curvature. ROM normal. No CVA tenderness. Cardio: regular rate and rhythm, S1, S2 normal, no murmur, click, rub or gallop GI: soft, non-tender; bowel sounds normal; no masses,  no organomegaly Extremities: extremities normal, atraumatic, no cyanosis or  edema  ECOG PERFORMANCE STATUS: 1 - Symptomatic but completely ambulatory  Blood pressure 126/85, pulse 97, temperature 98.3 F (36.8 C), temperature source Oral, resp. rate 18, height 5' 9" (1.753 m), weight 228 lb 3.2 oz (103.5 kg), SpO2 100 %.  LABORATORY DATA: Lab Results  Component Value Date   WBC 4.6 10/02/2018   HGB 11.3 (L) 10/02/2018   HCT 34.2 (L) 10/02/2018   MCV 83.4 10/02/2018   PLT 332 10/02/2018      Chemistry      Component Value Date/Time   NA 143 10/02/2018 0855   NA 144 07/25/2018 0949   K 3.6 10/02/2018 0855   CL 104 10/02/2018 0855   CO2 30 10/02/2018 0855   BUN 11 10/02/2018 0855   BUN 10 07/25/2018 0949   CREATININE 0.97 10/02/2018 0855   CREATININE 0.96 12/13/2016 1151      Component Value Date/Time   CALCIUM 9.4 10/02/2018 0855   ALKPHOS 50 10/02/2018 0855   AST 14 (L) 10/02/2018 0855   ALT 12 10/02/2018 0855   BILITOT 0.3 10/02/2018 0855       RADIOGRAPHIC STUDIES: Dg Chest 2 View  Result Date: 09/24/2018 CLINICAL DATA:  Sore throat. Small cell right lung cancer on chemotherapy and radiation therapy. EXAM: CHEST - 2 VIEW COMPARISON:  08/08/2018 chest radiograph. FINDINGS: Normal heart size. Thickening of the right paratracheal stripe is decreased. Otherwise stable mediastinal contour. No pneumothorax. No pleural effusion. Lungs appear clear, with no acute consolidative airspace disease and no pulmonary edema. IMPRESSION: Thickening of the right paratracheal stripe is decreased, compatible with treatment effect. No acute  cardiopulmonary disease. Electronically Signed   By: Ilona Sorrel M.D.   On: 09/24/2018 15:59   Mr Jeri Cos QP Contrast  Result Date: 09/06/2018 CLINICAL DATA:  New diagnosis small cell lung cancer. Possible brain Mets. EXAM: MRI HEAD WITHOUT AND WITH CONTRAST TECHNIQUE: Multiplanar, multiecho pulse sequences of the brain and surrounding structures were obtained without and with intravenous contrast. CONTRAST:  10 mL Gadavist. COMPARISON:  CT head 09/18/2014. MR head 06/03/2010. PET scan 08/21/2018. FINDINGS: The patient was unable to remain motionless for the exam. Small or subtle lesions could be overlooked. Brain: No evidence for acute infarction, hemorrhage, mass lesion, hydrocephalus, or extra-axial fluid. Slight premature atrophy. Mild subcortical and periventricular T2 and FLAIR hyperintensities, likely chronic microvascular ischemic change. Post infusion, no abnormal enhancement of the brain or meninges. Vascular: Flow voids are maintained throughout the carotid, basilar, and vertebral arteries. There are no areas of chronic hemorrhage. Skull and upper cervical spine: Unremarkable visualized calvarium, skullbase, and cervical vertebrae. Pituitary, pineal, cerebellar tonsils unremarkable. No upper cervical cord lesions. Sinuses/Orbits: Chronic maxillary sinusitis.  Negative orbits. Other: No mastoid fluid. Incompletely evaluated cervical adenopathy. Correlate with physical exam. IMPRESSION: Premature atrophy. Mild small vessel disease. No acute intracranial findings. No abnormal postcontrast enhancement to suggest intracranial metastatic disease on this mildly motion degraded scan. Cervical adenopathy, incompletely evaluated, but concerning for metastatic disease. Electronically Signed   By: Staci Righter M.D.   On: 09/06/2018 14:21   Ir Imaging Guided Port Insertion  Result Date: 09/29/2018 INDICATION: 53 year old male with a history of right hilar lung cancer EXAM: IMPLANTED PORT A CATH PLACEMENT  WITH ULTRASOUND AND FLUOROSCOPIC GUIDANCE MEDICATIONS: 2 g Ancef; The antibiotic was administered within an appropriate time interval prior to skin puncture. ANESTHESIA/SEDATION: Moderate (conscious) sedation was employed during this procedure. A total of Versed 4.0 mg and Fentanyl 100 mcg was administered intravenously. Moderate Sedation Time: 21  minutes. The patient's level of consciousness and vital signs were monitored continuously by radiology nursing throughout the procedure under my direct supervision. FLUOROSCOPY TIME:  0 minutes, 42 seconds (61.9 mGy) COMPLICATIONS: None PROCEDURE: The procedure, risks, benefits, and alternatives were explained to the patient. Questions regarding the procedure were encouraged and answered. The patient understands and consents to the procedure. Ultrasound survey was performed with images stored and sent to PACs. The left neck and chest was prepped with chlorhexidine, and draped in the usual sterile fashion using maximum barrier technique (cap and mask, sterile gown, sterile gloves, large sterile sheet, hand hygiene and cutaneous antiseptic). Antibiotic prophylaxis was provided with 2.0g Ancef administered IV one hour prior to skin incision. Local anesthesia was attained by infiltration with 1% lidocaine without epinephrine. Ultrasound demonstrated patency of the left internal jugular vein, and this was documented with an image. Under real-time ultrasound guidance, this vein was accessed with a 21 gauge micropuncture needle and image documentation was performed. A small dermatotomy was made at the access site with an 11 scalpel. A 0.018" wire was advanced into the SVC and used to estimate the length of the internal catheter. The access needle exchanged for a 22F micropuncture vascular sheath. The 0.018" wire was then removed and a 0.035" wire advanced into the IVC. An appropriate location for the subcutaneous reservoir was selected below the clavicle and an incision was made  through the skin and underlying soft tissues. The subcutaneous tissues were then dissected using a combination of blunt and sharp surgical technique and a pocket was formed. A single lumen power injectable portacatheter was then tunneled through the subcutaneous tissues from the pocket to the dermatotomy and the port reservoir placed within the subcutaneous pocket. The venous access site was then serially dilated and a peel away vascular sheath placed over the wire. The wire was removed and the port catheter advanced into position under fluoroscopic guidance. The catheter tip is positioned in the cavoatrial junction. This was documented with a spot image. The portacatheter was then tested and found to flush and aspirate well. The port was flushed with saline followed by 100 units/mL heparinized saline. The pocket was then closed in two layers using first subdermal inverted interrupted absorbable sutures followed by a running subcuticular suture. The epidermis was then sealed with Dermabond. The dermatotomy at the venous access site was also seal with Dermabond. Patient tolerated the procedure well and remained hemodynamically stable throughout. No complications encountered and no significant blood loss encountered IMPRESSION: Status post placement of left internal jugular port catheter. Catheter ready for use. Signed, Dulcy Fanny. Dellia Nims, RPVI [facility] Electronically Signed   By: Corrie Mckusick D.O.   On: 09/29/2018 16:36    ASSESSMENT AND PLAN: This is a very pleasant 52 years old African-American male recently diagnosed with limited stage small cell lung cancer and currently undergoing systemic chemotherapy with cisplatin and etoposide status post 1 cycle. The patient tolerated the first cycle of his treatment well with no concerning adverse effect except for fatigue. I recommended for him to proceed with cycle #2 tomorrow as a schedule. I will see  him back for follow-up visit in 3 weeks for evaluation after repeating CT scan of the chest for restaging of his disease. The patient was advised to call immediately if he has any continued concerning symptoms in the interval. The patient voices understanding of current disease status and treatment options and is in agreement with the current care plan.  All questions were answered. The patient knows to call the clinic with any problems, questions or concerns. We can certainly see the patient much sooner if necessary.  I spent 10 minutes counseling the patient face to face. The total time spent in the appointment was 15 minutes.  Disclaimer: This note was dictated with voice recognition software. Similar sounding words can inadvertently be transcribed and may not be corrected upon review.

## 2018-10-03 ENCOUNTER — Inpatient Hospital Stay: Payer: Medicare Other

## 2018-10-03 ENCOUNTER — Telehealth: Payer: Self-pay | Admitting: Internal Medicine

## 2018-10-03 ENCOUNTER — Ambulatory Visit
Admission: RE | Admit: 2018-10-03 | Discharge: 2018-10-03 | Disposition: A | Discharge status: Home / Self Care | Payer: Medicare Other | Source: Ambulatory Visit | Attending: Radiation Oncology | Admitting: Radiation Oncology

## 2018-10-03 ENCOUNTER — Other Ambulatory Visit: Payer: Self-pay | Admitting: Medical Oncology

## 2018-10-03 VITALS — BP 107/70 | HR 98 | Temp 98.9°F | Resp 18

## 2018-10-03 DIAGNOSIS — Z5111 Encounter for antineoplastic chemotherapy: Secondary | ICD-10-CM | POA: Diagnosis not present

## 2018-10-03 DIAGNOSIS — C3411 Malignant neoplasm of upper lobe, right bronchus or lung: Secondary | ICD-10-CM | POA: Diagnosis not present

## 2018-10-03 DIAGNOSIS — R5383 Other fatigue: Secondary | ICD-10-CM | POA: Diagnosis not present

## 2018-10-03 DIAGNOSIS — Z79899 Other long term (current) drug therapy: Secondary | ICD-10-CM | POA: Diagnosis not present

## 2018-10-03 DIAGNOSIS — Z95828 Presence of other vascular implants and grafts: Secondary | ICD-10-CM

## 2018-10-03 DIAGNOSIS — Z510 Encounter for antineoplastic radiation therapy: Secondary | ICD-10-CM | POA: Diagnosis not present

## 2018-10-03 DIAGNOSIS — Z5189 Encounter for other specified aftercare: Secondary | ICD-10-CM | POA: Diagnosis not present

## 2018-10-03 MED ORDER — PALONOSETRON HCL INJECTION 0.25 MG/5ML
INTRAVENOUS | Status: AC
  Filled 2018-10-03: qty 5

## 2018-10-03 MED ORDER — LIDOCAINE-PRILOCAINE 2.5-2.5 % EX CREA: 1.0000 "application " | TOPICAL_CREAM | CUTANEOUS | 0 refills | Status: DC | PRN

## 2018-10-03 MED ORDER — SODIUM CHLORIDE 0.9 % IV SOLN
Freq: Once | INTRAVENOUS | Status: AC
  Administered 2018-10-03: 12:00:00 via INTRAVENOUS
  Filled 2018-10-03: qty 5

## 2018-10-03 MED ORDER — SODIUM CHLORIDE 0.9 % IV SOLN
80.0000 mg/m2 | Freq: Once | INTRAVENOUS | Status: AC
  Administered 2018-10-03: 186 mg via INTRAVENOUS
  Filled 2018-10-03: qty 186

## 2018-10-03 MED ORDER — PALONOSETRON HCL INJECTION 0.25 MG/5ML
0.2500 mg | Freq: Once | INTRAVENOUS | Status: AC
  Administered 2018-10-03: 0.25 mg via INTRAVENOUS

## 2018-10-03 MED ORDER — SODIUM CHLORIDE 0.9 % IV SOLN
100.0000 mg/m2 | Freq: Once | INTRAVENOUS | Status: AC
  Administered 2018-10-03: 230 mg via INTRAVENOUS
  Filled 2018-10-03: qty 11.5

## 2018-10-03 MED ORDER — POTASSIUM CHLORIDE 2 MEQ/ML IV SOLN
Freq: Once | INTRAVENOUS | Status: AC
  Administered 2018-10-03: 10:00:00 via INTRAVENOUS
  Filled 2018-10-03: qty 10

## 2018-10-03 MED ORDER — SODIUM CHLORIDE 0.9 % IV SOLN
Freq: Once | INTRAVENOUS | Status: AC
  Administered 2018-10-03: 10:00:00 via INTRAVENOUS
  Filled 2018-10-03: qty 250

## 2018-10-03 MED ORDER — SODIUM CHLORIDE 0.9% FLUSH
10.0000 mL | INTRAVENOUS | Status: DC | PRN
  Administered 2018-10-03: 10 mL
  Filled 2018-10-03: qty 10

## 2018-10-03 MED ORDER — HEPARIN SOD (PORK) LOCK FLUSH 100 UNIT/ML IV SOLN
500.0000 [IU] | Freq: Once | INTRAVENOUS | Status: AC | PRN
  Administered 2018-10-03: 500 [IU]
  Filled 2018-10-03: qty 5

## 2018-10-03 MED FILL — LIDOCAINE-PRILOCAINE CREAM: 2.5-2.5 | 7 days supply | Qty: 30 | Fill #0

## 2018-10-03 NOTE — Patient Instructions (Signed)
[facility] Discharge Instructions for Patients Receiving Chemotherapy  Today you received the following chemotherapy agents: Cisplatin, Etoposide.  To help prevent nausea and vomiting after your treatment, we encourage you to take your nausea medication as prescribed.   If you develop nausea and vomiting that is not controlled by your nausea medication, call the clinic.   BELOW ARE SYMPTOMS THAT SHOULD BE REPORTED IMMEDIATELY:  *FEVER GREATER THAN 100.5 F  *CHILLS WITH OR WITHOUT FEVER  NAUSEA AND VOMITING THAT IS NOT CONTROLLED WITH YOUR NAUSEA MEDICATION  *UNUSUAL SHORTNESS OF BREATH  *UNUSUAL BRUISING OR BLEEDING  TENDERNESS IN MOUTH AND THROAT WITH OR WITHOUT PRESENCE OF ULCERS  *URINARY PROBLEMS  *BOWEL PROBLEMS  UNUSUAL RASH Items with * indicate a potential emergency and should be followed up as soon as possible.  Feel free to call the clinic should you have any questions or concerns. The clinic phone number is [phone].  Please show the [facility] at check-in to the Emergency Department and triage nurse.

## 2018-10-03 NOTE — Telephone Encounter (Signed)
Scheduled appt per 11/11 los - pt to get an updated schedule next visit.   

## 2018-10-03 NOTE — Progress Notes (Signed)
Nutrition Assessment   Reason for Assessment:   Weight loss, poor appetite   ASSESSMENT:  52 year old male with lung cancer followed by Dr. Julien Nordmann. Past medical history of CAD, COPD, depression, GERD, DM, HLD, HTN  Met with patient and wife during infusion today.  Patient had head covered with blankets and woke patient up for RD visit.  Patient kept eyes closed and said very little during visit.  Wife reports appetite has been poor since starting chemotherapy.  Drinks ensure/boost 1-2 per day. Eats a few spoonfuls of food during the day.  Wife reports patient is trying to drink water.  Patient's biggest compliant, no appetite.  Denies nausea, or problems chewing or swallowing.  Reports regular bowel movement.    Nutrition Focused Physical Exam: deferred   Medications: Vit D, zofran, compazine, ativan, metformin, remeron, vit b 12, compazine   Labs: glucose 101   Anthropometrics:   Height: 69 inches Weight: 228 lb 3.2 on 11/11 UBW: 240-244 lb per patient . Noted 245 lb on 10/10 BMI: 33  7% weight loss in the last month, significant   Estimated Energy Needs  Kcals: 2575-3000 calories/d Protein: 110-146 g/d Fluid: > 2.5 L/d   NUTRITION DIAGNOSIS: Inadequate oral intake related to cancer and cancer related treatment side effects as evidenced by 7% weight loss in 1 month, eating < 50% energy needs in > or equal to 5 days    INTERVENTION:  Discussed strategies with wife on ways to increase calories and protein. Fact sheet given. Encouraged high calorie shake at least 2 times per day, 3 if can drink it. Encouraged liberalizing diet but continuing to check blood glucose.  If blood glucose elevated would recommend medication adjustment vs diet restrictions at this time. Instructed wife to notify MD if blood glucose elevated. Contact information provided   MONITORING, EVALUATION, GOAL: Patient to increased calories and protein to maintain weight   Next Visit: Tuesday, Dec  3 during infusion  Anye Brose B. Zenia Resides, [place], [place] Registered Dietitian [phone] (pager)

## 2018-10-04 ENCOUNTER — Other Ambulatory Visit: Payer: Self-pay | Admitting: Medical Oncology

## 2018-10-04 ENCOUNTER — Ambulatory Visit
Admission: RE | Admit: 2018-10-04 | Discharge: 2018-10-04 | Disposition: A | Discharge status: Home / Self Care | Payer: Medicare Other | Source: Ambulatory Visit | Attending: Radiation Oncology | Admitting: Radiation Oncology

## 2018-10-04 ENCOUNTER — Inpatient Hospital Stay: Payer: Medicare Other

## 2018-10-04 VITALS — BP 114/72 | HR 96 | Temp 97.9°F | Resp 17

## 2018-10-04 DIAGNOSIS — Z79899 Other long term (current) drug therapy: Secondary | ICD-10-CM | POA: Diagnosis not present

## 2018-10-04 DIAGNOSIS — Z510 Encounter for antineoplastic radiation therapy: Secondary | ICD-10-CM | POA: Diagnosis not present

## 2018-10-04 DIAGNOSIS — C3411 Malignant neoplasm of upper lobe, right bronchus or lung: Secondary | ICD-10-CM

## 2018-10-04 DIAGNOSIS — Z5111 Encounter for antineoplastic chemotherapy: Secondary | ICD-10-CM | POA: Diagnosis not present

## 2018-10-04 DIAGNOSIS — R5383 Other fatigue: Secondary | ICD-10-CM | POA: Diagnosis not present

## 2018-10-04 DIAGNOSIS — Z5189 Encounter for other specified aftercare: Secondary | ICD-10-CM | POA: Diagnosis not present

## 2018-10-04 MED ORDER — SODIUM CHLORIDE 0.9 % IV SOLN
100.0000 mg/m2 | Freq: Once | INTRAVENOUS | Status: AC
  Administered 2018-10-04: 230 mg via INTRAVENOUS
  Filled 2018-10-04: qty 11.5

## 2018-10-04 MED ORDER — SODIUM CHLORIDE 0.9% FLUSH
10.0000 mL | INTRAVENOUS | Status: DC | PRN
  Administered 2018-10-04: 10 mL
  Filled 2018-10-04: qty 10

## 2018-10-04 MED ORDER — HEPARIN SOD (PORK) LOCK FLUSH 100 UNIT/ML IV SOLN
500.0000 [IU] | Freq: Once | INTRAVENOUS | Status: AC | PRN
  Administered 2018-10-04: 500 [IU]
  Filled 2018-10-04: qty 5

## 2018-10-04 MED ORDER — DEXAMETHASONE SODIUM PHOSPHATE 10 MG/ML IJ SOLN
10.0000 mg | Freq: Once | INTRAMUSCULAR | Status: AC
  Administered 2018-10-04: 10 mg via INTRAVENOUS

## 2018-10-04 MED ORDER — DEXAMETHASONE SODIUM PHOSPHATE 10 MG/ML IJ SOLN
INTRAMUSCULAR | Status: AC
  Filled 2018-10-04: qty 1

## 2018-10-04 MED ORDER — SODIUM CHLORIDE 0.9 % IV SOLN
Freq: Once | INTRAVENOUS | Status: AC
  Administered 2018-10-04: 09:00:00 via INTRAVENOUS
  Filled 2018-10-04: qty 250

## 2018-10-04 NOTE — Patient Instructions (Signed)
[facility] Discharge Instructions for Patients Receiving Chemotherapy  Today you received the following chemotherapy agents:  Etoposide  To help prevent nausea and vomiting after your treatment, we encourage you to take your nausea medication as prescribed.   If you develop nausea and vomiting that is not controlled by your nausea medication, call the clinic.   BELOW ARE SYMPTOMS THAT SHOULD BE REPORTED IMMEDIATELY:  *FEVER GREATER THAN 100.5 F  *CHILLS WITH OR WITHOUT FEVER  NAUSEA AND VOMITING THAT IS NOT CONTROLLED WITH YOUR NAUSEA MEDICATION  *UNUSUAL SHORTNESS OF BREATH  *UNUSUAL BRUISING OR BLEEDING  TENDERNESS IN MOUTH AND THROAT WITH OR WITHOUT PRESENCE OF ULCERS  *URINARY PROBLEMS  *BOWEL PROBLEMS  UNUSUAL RASH Items with * indicate a potential emergency and should be followed up as soon as possible.  Feel free to call the clinic should you have any questions or concerns. The clinic phone number is [phone].  Please show the [facility] at check-in to the Emergency Department and triage nurse.

## 2018-10-05 ENCOUNTER — Ambulatory Visit
Admission: RE | Admit: 2018-10-05 | Discharge: 2018-10-05 | Disposition: A | Discharge status: Home / Self Care | Payer: Medicare Other | Source: Ambulatory Visit | Attending: Radiation Oncology | Admitting: Radiation Oncology

## 2018-10-05 DIAGNOSIS — C3411 Malignant neoplasm of upper lobe, right bronchus or lung: Secondary | ICD-10-CM | POA: Diagnosis not present

## 2018-10-05 DIAGNOSIS — Z510 Encounter for antineoplastic radiation therapy: Secondary | ICD-10-CM | POA: Diagnosis not present

## 2018-10-06 ENCOUNTER — Ambulatory Visit: Payer: Medicare Other

## 2018-10-08 ENCOUNTER — Other Ambulatory Visit: Payer: Self-pay

## 2018-10-08 ENCOUNTER — Emergency Department (HOSPITAL_COMMUNITY): Payer: Medicare Other

## 2018-10-08 ENCOUNTER — Encounter (HOSPITAL_COMMUNITY): Payer: Self-pay | Admitting: *Deleted

## 2018-10-08 ENCOUNTER — Inpatient Hospital Stay (HOSPITAL_COMMUNITY)
Admission: EM | Admit: 2018-10-08 | Discharge: 2018-10-11 | DRG: 683 | Disposition: A | Discharge status: Home / Self Care | Payer: Medicare Other | Attending: Internal Medicine | Admitting: Internal Medicine

## 2018-10-08 DIAGNOSIS — G8929 Other chronic pain: Secondary | ICD-10-CM | POA: Diagnosis present

## 2018-10-08 DIAGNOSIS — Z91041 Radiographic dye allergy status: Secondary | ICD-10-CM

## 2018-10-08 DIAGNOSIS — Z9861 Coronary angioplasty status: Secondary | ICD-10-CM

## 2018-10-08 DIAGNOSIS — E1142 Type 2 diabetes mellitus with diabetic polyneuropathy: Secondary | ICD-10-CM | POA: Diagnosis present

## 2018-10-08 DIAGNOSIS — Z7982 Long term (current) use of aspirin: Secondary | ICD-10-CM

## 2018-10-08 DIAGNOSIS — K219 Gastro-esophageal reflux disease without esophagitis: Secondary | ICD-10-CM

## 2018-10-08 DIAGNOSIS — E860 Dehydration: Secondary | ICD-10-CM | POA: Diagnosis present

## 2018-10-08 DIAGNOSIS — I10 Essential (primary) hypertension: Secondary | ICD-10-CM | POA: Diagnosis present

## 2018-10-08 DIAGNOSIS — Z9221 Personal history of antineoplastic chemotherapy: Secondary | ICD-10-CM

## 2018-10-08 DIAGNOSIS — G4730 Sleep apnea, unspecified: Secondary | ICD-10-CM | POA: Diagnosis present

## 2018-10-08 DIAGNOSIS — Z7951 Long term (current) use of inhaled steroids: Secondary | ICD-10-CM

## 2018-10-08 DIAGNOSIS — J449 Chronic obstructive pulmonary disease, unspecified: Secondary | ICD-10-CM | POA: Diagnosis present

## 2018-10-08 DIAGNOSIS — Z955 Presence of coronary angioplasty implant and graft: Secondary | ICD-10-CM

## 2018-10-08 DIAGNOSIS — R1310 Dysphagia, unspecified: Secondary | ICD-10-CM

## 2018-10-08 DIAGNOSIS — Z79899 Other long term (current) drug therapy: Secondary | ICD-10-CM

## 2018-10-08 DIAGNOSIS — C3490 Malignant neoplasm of unspecified part of unspecified bronchus or lung: Secondary | ICD-10-CM | POA: Diagnosis not present

## 2018-10-08 DIAGNOSIS — E876 Hypokalemia: Secondary | ICD-10-CM

## 2018-10-08 DIAGNOSIS — I2510 Atherosclerotic heart disease of native coronary artery without angina pectoris: Secondary | ICD-10-CM | POA: Diagnosis not present

## 2018-10-08 DIAGNOSIS — N179 Acute kidney failure, unspecified: Secondary | ICD-10-CM | POA: Diagnosis not present

## 2018-10-08 DIAGNOSIS — E785 Hyperlipidemia, unspecified: Secondary | ICD-10-CM | POA: Diagnosis present

## 2018-10-08 DIAGNOSIS — Z7902 Long term (current) use of antithrombotics/antiplatelets: Secondary | ICD-10-CM

## 2018-10-08 DIAGNOSIS — Z8249 Family history of ischemic heart disease and other diseases of the circulatory system: Secondary | ICD-10-CM

## 2018-10-08 DIAGNOSIS — Z7984 Long term (current) use of oral hypoglycemic drugs: Secondary | ICD-10-CM

## 2018-10-08 DIAGNOSIS — F329 Major depressive disorder, single episode, unspecified: Secondary | ICD-10-CM | POA: Diagnosis present

## 2018-10-08 DIAGNOSIS — D649 Anemia, unspecified: Secondary | ICD-10-CM | POA: Diagnosis present

## 2018-10-08 DIAGNOSIS — J309 Allergic rhinitis, unspecified: Secondary | ICD-10-CM | POA: Diagnosis present

## 2018-10-08 DIAGNOSIS — N401 Enlarged prostate with lower urinary tract symptoms: Secondary | ICD-10-CM | POA: Diagnosis present

## 2018-10-08 DIAGNOSIS — C3411 Malignant neoplasm of upper lobe, right bronchus or lung: Secondary | ICD-10-CM | POA: Diagnosis not present

## 2018-10-08 DIAGNOSIS — E0800 Diabetes mellitus due to underlying condition with hyperosmolarity without nonketotic hyperglycemic-hyperosmolar coma (NKHHC): Secondary | ICD-10-CM

## 2018-10-08 DIAGNOSIS — F419 Anxiety disorder, unspecified: Secondary | ICD-10-CM | POA: Diagnosis not present

## 2018-10-08 DIAGNOSIS — E119 Type 2 diabetes mellitus without complications: Secondary | ICD-10-CM

## 2018-10-08 DIAGNOSIS — R630 Anorexia: Secondary | ICD-10-CM | POA: Diagnosis present

## 2018-10-08 DIAGNOSIS — R1319 Other dysphagia: Secondary | ICD-10-CM | POA: Diagnosis present

## 2018-10-08 DIAGNOSIS — Z87891 Personal history of nicotine dependence: Secondary | ICD-10-CM

## 2018-10-08 DIAGNOSIS — Z833 Family history of diabetes mellitus: Secondary | ICD-10-CM

## 2018-10-08 DIAGNOSIS — J069 Acute upper respiratory infection, unspecified: Secondary | ICD-10-CM

## 2018-10-08 DIAGNOSIS — G4733 Obstructive sleep apnea (adult) (pediatric): Secondary | ICD-10-CM | POA: Diagnosis present

## 2018-10-08 DIAGNOSIS — Z923 Personal history of irradiation: Secondary | ICD-10-CM

## 2018-10-08 LAB — I-STAT CHEM 8, ED
BUN: 43 mg/dL — ABNORMAL HIGH (ref 6–20)
CHLORIDE: 101 mmol/L (ref 98–111)
CREATININE: 2.5 mg/dL — AB (ref 0.61–1.24)
Calcium, Ion: 1.14 mmol/L — ABNORMAL LOW (ref 1.15–1.40)
Glucose, Bld: 181 mg/dL — ABNORMAL HIGH (ref 70–99)
HCT: 31 % — ABNORMAL LOW (ref 39.0–52.0)
Hemoglobin: 10.5 g/dL — ABNORMAL LOW (ref 13.0–17.0)
POTASSIUM: 3.4 mmol/L — AB (ref 3.5–5.1)
Sodium: 140 mmol/L (ref 135–145)
TCO2: 28 mmol/L (ref 22–32)

## 2018-10-08 LAB — URINALYSIS, ROUTINE W REFLEX MICROSCOPIC
BILIRUBIN URINE: NEGATIVE
Bacteria, UA: NONE SEEN
GLUCOSE, UA: 50 mg/dL — AB
Ketones, ur: NEGATIVE mg/dL
LEUKOCYTES UA: NEGATIVE
Nitrite: NEGATIVE
PH: 5 (ref 5.0–8.0)
Protein, ur: 100 mg/dL — AB
RBC / HPF: >50 RBC/hpf — ABNORMAL HIGH (ref 0–5)
Specific Gravity, Urine: 1.015 (ref 1.005–1.030)

## 2018-10-08 LAB — CBC
HCT: 32 % — ABNORMAL LOW (ref 39.0–52.0)
Hemoglobin: 10.8 g/dL — ABNORMAL LOW (ref 13.0–17.0)
MCH: 28.8 pg (ref 26.0–34.0)
MCHC: 33.8 g/dL (ref 30.0–36.0)
MCV: 85.3 fL (ref 80.0–100.0)
NRBC: 0 % (ref 0.0–0.2)
PLATELETS: 362 10*3/uL (ref 150–400)
RBC: 3.75 MIL/uL — AB (ref 4.22–5.81)
RDW: 12.2 % (ref 11.5–15.5)
WBC: 4.4 10*3/uL (ref 4.0–10.5)

## 2018-10-08 LAB — BASIC METABOLIC PANEL
ANION GAP: 11 (ref 5–15)
BUN: 50 mg/dL — ABNORMAL HIGH (ref 6–20)
CO2: 28 mmol/L (ref 22–32)
Calcium: 8.7 mg/dL — ABNORMAL LOW (ref 8.9–10.3)
Chloride: 101 mmol/L (ref 98–111)
Creatinine, Ser: 2.25 mg/dL — ABNORMAL HIGH (ref 0.61–1.24)
GFR calc non Af Amer: 32 mL/min — ABNORMAL LOW (ref >60)
GFR, EST AFRICAN AMERICAN: 37 mL/min — AB (ref >60)
Glucose, Bld: 184 mg/dL — ABNORMAL HIGH (ref 70–99)
Potassium: 3.3 mmol/L — ABNORMAL LOW (ref 3.5–5.1)
Sodium: 140 mmol/L (ref 135–145)

## 2018-10-08 LAB — I-STAT CG4 LACTIC ACID, ED: Lactic Acid, Venous: 1.32 mmol/L (ref 0.5–1.9)

## 2018-10-08 LAB — CBG MONITORING, ED: Glucose-Capillary: 184 mg/dL — ABNORMAL HIGH (ref 70–99)

## 2018-10-08 LAB — MAGNESIUM: Magnesium: 1.6 mg/dL — ABNORMAL LOW (ref 1.7–2.4)

## 2018-10-08 LAB — GLUCOSE, CAPILLARY
GLUCOSE-CAPILLARY: 90 mg/dL (ref 70–99)
Glucose-Capillary: 149 mg/dL — ABNORMAL HIGH (ref 70–99)

## 2018-10-08 MED ORDER — ONDANSETRON HCL 4 MG/2ML IJ SOLN
4.0000 mg | Freq: Once | INTRAMUSCULAR | Status: AC
  Administered 2018-10-08: 4 mg via INTRAVENOUS
  Filled 2018-10-08: qty 2

## 2018-10-08 MED ORDER — HYDROMORPHONE HCL 1 MG/ML IJ SOLN
1.0000 mg | INTRAMUSCULAR | Status: DC | PRN
  Administered 2018-10-08 – 2018-10-11 (×9): 1 mg via INTRAVENOUS
  Filled 2018-10-08 (×9): qty 1

## 2018-10-08 MED ORDER — LACTATED RINGERS IV BOLUS
1000.0000 mL | Freq: Once | INTRAVENOUS | Status: AC
  Administered 2018-10-08: 1000 mL via INTRAVENOUS

## 2018-10-08 MED ORDER — ONDANSETRON HCL 4 MG PO TABS: 4.0000 mg | ORAL_TABLET | Freq: Four times a day (QID) | ORAL | Status: DC | PRN

## 2018-10-08 MED ORDER — LIDOCAINE VISCOUS HCL 2 % MT SOLN
15.0000 mL | Freq: Once | OROMUCOSAL | Status: AC
  Administered 2018-10-08: 15 mL via ORAL
  Filled 2018-10-08: qty 15

## 2018-10-08 MED ORDER — ORAL CARE MOUTH RINSE
15.0000 mL | Freq: Two times a day (BID) | OROMUCOSAL | Status: DC
  Administered 2018-10-09 – 2018-10-11 (×3): 15 mL via OROMUCOSAL

## 2018-10-08 MED ORDER — POTASSIUM CHLORIDE CRYS ER 20 MEQ PO TBCR: 40.0000 meq | EXTENDED_RELEASE_TABLET | Freq: Once | ORAL | Status: DC

## 2018-10-08 MED ORDER — DIPHENHYDRAMINE HCL 50 MG/ML IJ SOLN
25.0000 mg | Freq: Once | INTRAMUSCULAR | Status: AC
  Administered 2018-10-08: 25 mg via INTRAVENOUS
  Filled 2018-10-08: qty 1

## 2018-10-08 MED ORDER — DIPHENHYDRAMINE HCL 25 MG PO CAPS
25.0000 mg | ORAL_CAPSULE | Freq: Four times a day (QID) | ORAL | Status: DC | PRN
  Administered 2018-10-08: 25 mg via ORAL
  Filled 2018-10-08: qty 1

## 2018-10-08 MED ORDER — POTASSIUM CHLORIDE 10 MEQ/100ML IV SOLN
10.0000 meq | INTRAVENOUS | Status: AC
  Administered 2018-10-08 (×3): 10 meq via INTRAVENOUS
  Filled 2018-10-08 (×3): qty 100

## 2018-10-08 MED ORDER — ACETAMINOPHEN 325 MG PO TABS: 650.0000 mg | ORAL_TABLET | Freq: Four times a day (QID) | ORAL | Status: DC | PRN

## 2018-10-08 MED ORDER — SODIUM CHLORIDE 0.9 % IV SOLN
INTRAVENOUS | Status: DC
  Administered 2018-10-08 – 2018-10-10 (×5): via INTRAVENOUS

## 2018-10-08 MED ORDER — BOOST / RESOURCE BREEZE PO LIQD CUSTOM
1.0000 | Freq: Three times a day (TID) | ORAL | Status: DC
  Administered 2018-10-08 – 2018-10-09 (×3): 1 via ORAL

## 2018-10-08 MED ORDER — HYDROMORPHONE HCL 1 MG/ML IJ SOLN
1.0000 mg | Freq: Once | INTRAMUSCULAR | Status: AC
  Administered 2018-10-08: 1 mg via INTRAVENOUS
  Filled 2018-10-08: qty 1

## 2018-10-08 MED ORDER — DIPHENHYDRAMINE HCL 25 MG PO CAPS
25.0000 mg | ORAL_CAPSULE | ORAL | Status: DC | PRN
  Administered 2018-10-09: 25 mg via ORAL
  Filled 2018-10-08: qty 1

## 2018-10-08 MED ORDER — MAGNESIUM SULFATE 2 GM/50ML IV SOLN
2.0000 g | Freq: Once | INTRAVENOUS | Status: AC
  Administered 2018-10-08: 2 g via INTRAVENOUS
  Filled 2018-10-08: qty 50

## 2018-10-08 MED ORDER — ENOXAPARIN SODIUM 30 MG/0.3ML ~~LOC~~ SOLN
30.0000 mg | SUBCUTANEOUS | Status: DC
  Administered 2018-10-08: 30 mg via SUBCUTANEOUS
  Filled 2018-10-08: qty 0.3

## 2018-10-08 MED ORDER — ALUM & MAG HYDROXIDE-SIMETH 200-200-20 MG/5ML PO SUSP
30.0000 mL | Freq: Once | ORAL | Status: AC
  Administered 2018-10-08: 30 mL via ORAL
  Filled 2018-10-08: qty 30

## 2018-10-08 MED ORDER — ONDANSETRON HCL 4 MG/2ML IJ SOLN
4.0000 mg | Freq: Four times a day (QID) | INTRAMUSCULAR | Status: DC | PRN
  Administered 2018-10-09 – 2018-10-11 (×2): 4 mg via INTRAVENOUS
  Filled 2018-10-08 (×2): qty 2

## 2018-10-08 MED ORDER — KETOROLAC TROMETHAMINE 30 MG/ML IJ SOLN
30.0000 mg | Freq: Once | INTRAMUSCULAR | Status: AC
  Administered 2018-10-08: 30 mg via INTRAVENOUS
  Filled 2018-10-08: qty 1

## 2018-10-08 MED ORDER — ACETAMINOPHEN 650 MG RE SUPP: 650.0000 mg | Freq: Four times a day (QID) | RECTAL | Status: DC | PRN

## 2018-10-08 MED ORDER — CHLORHEXIDINE GLUCONATE 0.12 % MT SOLN
15.0000 mL | Freq: Two times a day (BID) | OROMUCOSAL | Status: DC
  Administered 2018-10-08 – 2018-10-11 (×4): 15 mL via OROMUCOSAL
  Filled 2018-10-08 (×5): qty 15

## 2018-10-08 NOTE — ED Notes (Signed)
Pt refused this RN to access his port

## 2018-10-08 NOTE — ED Triage Notes (Signed)
Wife states pt has not been about to eat or drink in about a week, he tries but cannot take po's in. Increassed weakness, no nausea or vomiting. Last chemo Nov 11th followed by radiation the rest of the week.

## 2018-10-08 NOTE — ED Notes (Signed)
Pt reminded again that we need to collect a urine sample.  Pt expressed understanding. Pt states his leg cramps are painful to him.  Will let Dr. Ellender Hose know.

## 2018-10-08 NOTE — ED Provider Notes (Signed)
[facility] DEPT Provider Note   CSN: [id number] Arrival date & time: 10/08/18  9024     History   Chief Complaint Chief Complaint  Patient presents with  . Weakness    HPI Andrew West is a 52 y.o. male.  HPI 52 year old male here with generalized weakness.  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  The patient reportedly has had progressively worsening nausea, vomiting, and little to no p.o. intake.  He has had essentially nothing to eat or drink for the last week.  He was seen several weeks ago and diagnosed with sore throat and since then has been progressively declining.  He has had generalized weakness.  He is sleeping more than usual.  He states that he has pain and poor appetite.  He endorses the pain as generalized body aches as well as cramping in his bilateral legs.  He denies any burning, stabbing pain whenever he tries to swallow.  Denies any fevers.  He has been receiving regular radiation during the week.  No alleviating factors.  Past Medical History:  Diagnosis Date  . Anxiety   . CAD (coronary artery disease)    a. s/p multiple PCIs with last cath 11/2016 with severe multivessel CAD, s/p PCTA to LCx but unable to pass stent  . Chronic leg pain    bilateral  . Chronic lower back pain   . COPD (chronic obstructive pulmonary disease) ([place])   . Depression   . GERD (gastroesophageal reflux disease)    Takes Dexilant  . HLD (hyperlipidemia)   . Hypertension   . Rhabdomyolysis    h/o, r/t statins  . Sleep apnea    "can't tolerate mask" (12/16/2016)  . Type II diabetes mellitus [facility])     Patient Active Problem List   Diagnosis Date Noted  . 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 Goals of care, counseling/discussion 08/31/2018  . Encounter for antineoplastic chemotherapy 08/31/2018  . Encounter for smoking cessation counseling 08/31/2018  .  Malignant neoplasm of bronchus of right upper lobe ([place]) 08/29/2018  . Mediastinal mass 08/17/2018  . Vitamin D deficiency 08/04/2018  . Diabetic peripheral neuropathy ([place]) 06/30/2018  . Substernal chest pain 02/25/2018  . Benign prostatic hyperplasia with urinary hesitancy 11/17/2017  . Preoperative cardiovascular examination 07/12/2017  . Refractory angina ([place]) 05/24/2017  . Complex regional pain syndrome type I 03/24/2017  . Progressive angina ([place]) -Class III 12/16/2016  . Anxiety 06/28/2016  . Diastolic dysfunction-grade 2 with EF 60-65% Oct 2016 03/22/2016  . Hypertension 10/03/2015  . CAD S/P multiple PCI's 10/03/2015  . Pulmonary nodule 06/24/2015  . Cough 06/24/2015  . COPD with asthma ([place]) 06/24/2015  . Reactive airway disease 06/04/2015  . DOE (dyspnea on exertion) 04/15/2015  . Lumbar disc herniation with radiculopathy 03/31/2012  . Presence of stent in left circumflex coronary artery 10/18/2011    Class: History of  . TOBACCO ABUSE 02/27/2009  . ABDOMINAL PAIN, LEFT LOWER QUADRANT 12/30/2008  . ABDOMINAL PAIN, EPIGASTRIC 12/05/2008  . Anxiety and depression 09/05/2008  . Hereditary and idiopathic peripheral neuropathy 09/05/2008  . GERD 09/05/2008  . Cervical disc disorder with radiculopathy of cervical region 08/19/2008  . HLD (hyperlipidemia) 04/25/2008    Class: Diagnosis of  . ALLERGIC RHINITIS 04/25/2008  . Backache 04/25/2008  . Chest pain, unspecified 04/25/2008  . COLONIC POLYPS, HX OF 04/25/2008  . Obesity 04/18/2008  Class: Diagnosis of  . ANAL FISSURE, HX OF 04/18/2008  . Diabetes mellitus ([place]) 01/30/2008    Class: History of  . RECTAL BLEEDING 01/30/2008    Past Surgical History:  Procedure Laterality Date  . BACK SURGERY    . BRONCHIAL BIOPSY  08/25/2018   Procedure: BRONCHIAL BIOPSIES;  Surgeon: Garner Nash, DO;  Location: [facility];  Service: Cardiopulmonary;;  . CARDIAC CATHETERIZATION N/A 09/25/2015   Procedure: Left Heart  Cath and Coronary Angiography;  Surgeon: Leonie Man, MD;  Location: [facility] CV LAB;  Service: Cardiovascular;  Laterality: N/A;  . CARDIAC CATHETERIZATION N/A 12/16/2016   Procedure: Left Heart Cath and Coronary Angiography;  Surgeon: Leonie Man, MD;  Location: [facility] CV LAB;  Service: Cardiovascular;  Laterality: N/A;  . CARDIAC CATHETERIZATION N/A 12/16/2016   Procedure: Coronary Balloon Angioplasty;  Surgeon: Leonie Man, MD;  Location: [facility] CV LAB;  Service: Cardiovascular;  Laterality: N/A;  . COLONOSCOPY W/ POLYPECTOMY    . CORONARY ANGIOPLASTY  09/25/2015   mid cir & om  . CORONARY ANGIOPLASTY WITH STENT PLACEMENT  10/09/2001   PTCA & stenting of mid AV circumflex; 2.5x81m Pixel stent  . CORONARY ANGIOPLASTY WITH STENT PLACEMENT  12/13/2001   PCI with stent to mid L circumflex, 95% stenosis to 0% residual  . CORONARY ANGIOPLASTY WITH STENT PLACEMENT  10/10/2003   PCI to mid AV circumflex; LAD 30% disease; RCA 100% occluded prox.  . CORONARY ANGIOPLASTY WITH STENT PLACEMENT  09/01/2011   PCI with stenting with bare metal stent to mid AV groove circumflex and PDA  . CORONARY ANGIOPLASTY WITH STENT PLACEMENT  10/17/2011   cutting balloon angioplasty of ostial lateral OM1 branch and bifurcation AV groove circumflex OM junction; stenosis reduced to 0%  . ENDOBRONCHIAL ULTRASOUND Bilateral 08/25/2018   Procedure: ENDOBRONCHIAL ULTRASOUND;  Surgeon: IGarner Nash DO;  [facility];  Service: Cardiopulmonary;  Laterality: Bilateral;  . EXCISIONAL HEMORRHOIDECTOMY    . FINE NEEDLE ASPIRATION  08/25/2018   Procedure: FINE NEEDLE ASPIRATION;  Surgeon: IGarner Nash DO;  Location: [facility];  Service: Cardiopulmonary;;  . FLEXIBLE BRONCHOSCOPY  08/25/2018   Procedure: FLEXIBLE BRONCHOSCOPY;  Surgeon: IGarner Nash DO;  [facility]: [facility];  Service: Cardiopulmonary;;  . IR IMAGING GUIDED PORT INSERTION  09/29/2018  . LEFT HEART CATHETERIZATION  WITH CORONARY ANGIOGRAM N/A 10/18/2011   Procedure: LEFT HEART CATHETERIZATION WITH CORONARY ANGIOGRAM;  Surgeon: DLeonie Man MD;  [facility];  Service: Cardiovascular;  Laterality: N/A;  . LUMBAR LAMINECTOMY/DECOMPRESSION MICRODISCECTOMY  03/31/2012   Procedure: LUMBAR LAMINECTOMY/DECOMPRESSION MICRODISCECTOMY 1 LEVEL;  Surgeon: HCharlie Pitter MD;  [facility];  Service: Neurosurgery;  Laterality: Left;  . TRANSTHORACIC ECHOCARDIOGRAM  07/28/2011   EF 55-65%; LVH, grade 1 diastolic dysfunction;         Home Medications    Prior to Admission medications   Medication Sig Start Date End Date Taking? Authorizing Provider  albuterol (PROAIR HFA) 108 (90 Base) MCG/ACT inhaler Inhale 1 puff into the lungs every 6 (six) hours as needed for wheezing or shortness of breath. 08/29/18  Yes NCharlott Rakes MD  amLODipine (NORVASC) 10 MG tablet Take 1 tablet (10 mg total) by mouth daily. 04/20/18  Yes NCharlott Rakes MD  aspirin 81 MG tablet Take 1 tablet (81 mg total) daily by mouth. 10/10/17  Yes Newlin, Enobong, MD  baclofen (LIORESAL) 10 MG tablet Take 1 tablet (10 mg total) by mouth 3 (three) times  daily. Patient taking differently: Take 10 mg by mouth 3 (three) times daily as needed for muscle spasms.  06/30/18  Yes Jamse Arn, MD  benzonatate (TESSALON) 100 MG capsule Take 1 capsule (100 mg total) by mouth every 8 (eight) hours. 09/19/18  Yes Curt Bears, MD  Blood Glucose Monitoring Suppl (ACCU-CHEK AVIVA PLUS) w/Device KIT Use as dircted 07/12/17  Yes [name], MD  Cholecalciferol (VITAMIN D) 2000 units tablet Take 1 tablet (2,000 Units total) by mouth daily. 08/04/18  Yes Jamse Arn, MD  clopidogrel (PLAVIX) 75 MG tablet Take 1 tablet (75 mg total) by mouth daily. YOU MAY RESTART 08/26/2018 08/25/18  Yes Icard, Bradley L, DO  diclofenac sodium (VOLTAREN) 1 % GEL Apply 2 g topically 4 (four) times daily. 04/20/18  Yes [name], MD  FLUoxetine  (PROZAC) 40 MG capsule Take 1 capsule (40 mg total) by mouth daily. 04/20/18  Yes Newlin, Charlane Ferretti, MD  fluticasone (FLONASE) 50 MCG/ACT nasal spray Place 2 sprays into both nostrils daily. Patient taking differently: Place 2 sprays into both nostrils daily as needed for allergies.  02/24/17  Yes McClung, Angela M, PA-C  Fluticasone-Salmeterol (ADVAIR) 100-50 MCG/DOSE AEPB Inhale 1 puff into the lungs 2 (two) times daily. 07/25/18  Yes [name], MD  furosemide (LASIX) 20 MG tablet Take 20 mg by mouth as needed for edema.    Yes [provider]  glucose blood (ACCU-CHEK AVIVA) test strip Use as instructed 07/12/17  Yes Newlin, Charlane Ferretti, MD  hydrALAZINE (APRESOLINE) 25 MG tablet Take 1 tablet (25 mg total) by mouth 2 (two) times daily. 04/20/18  Yes [name], MD  HYDROcodone-acetaminophen (NORCO) 5-325 MG tablet Take 1-2 tablets by mouth every 6 (six) hours as needed for moderate pain. 09/29/18  Yes Kyung Rudd, MD  hydrocortisone-pramoxine [facility]) 2.5-1 % rectal cream PLACE 1 APPLICATION RECTALLY 3 (THREE) TIMES DAILY. Patient taking differently: Place 1 application rectally as needed.  08/25/17  Yes [name], MD  isosorbide mononitrate (IMDUR) 120 MG 24 hr tablet Take 1 tablet (120 mg total) by mouth daily. 04/20/18  Yes [name], MD  lidocaine-prilocaine (EMLA) cream Apply 1 application topically as needed. Apply 1 tsp on skin over port site one hour prior to chemotherapy. Cover with plastic wrap. Do not rub in medication. 10/03/18  Yes Curt Bears, MD  LORazepam (ATIVAN) 0.5 MG tablet 1 tab po q 4-6 hours prn or 1 tab po 30 minutes prior to radiation 08/30/18  Yes Hayden Pedro, PA-C  losartan-hydrochlorothiazide (HYZAAR) 100-25 MG tablet Take 1 tablet by mouth daily. 04/20/18  Yes [name], MD  magic mouthwash SOLN Take 10 mLs by mouth 3 (three) times daily as needed for pain. 09/24/18  Yes [provider]  metFORMIN (GLUCOPHAGE) 1000 MG  tablet Take 1 tablet (1,000 mg total) by mouth 2 (two) times daily with a meal. 04/20/18  Yes Newlin, Enobong, MD  mirtazapine (REMERON) 30 MG tablet Take 1 tablet (30 mg total) by mouth at bedtime. 08/31/18  Yes Curt Bears, MD  Nebivolol HCl 20 MG TABS Take 1 tablet (20 mg total) by mouth daily. 01/26/18  Yes Hilty, Nadean Corwin, MD  nicotine (NICODERM CQ) 21 mg/24hr patch Place 1 patch (21 mg total) onto the skin daily. 08/31/18  Yes Curt Bears, MD  nitroGLYCERIN (NITROSTAT) 0.4 MG SL tablet Place 1 tablet (0.4 mg total) under the tongue every 5 (five) minutes as needed for chest pain. 12/24/16  Yes Strader, [facility], PA-C  ondansetron [facility])  8 MG tablet Take 1 tablet (8 mg total) by mouth every 8 (eight) hours as needed for nausea or vomiting. 09/20/18  Yes Curt Bears, MD  pantoprazole (PROTONIX) 40 MG tablet Take 1 tablet (40 mg total) by mouth 2 (two) times daily. 04/20/18  Yes [name], MD  prazosin (MINIPRESS) 1 MG capsule Take 1 capsule (1 mg total) by mouth at bedtime. For nightmares 07/25/18  Yes [name], MD  prochlorperazine (COMPAZINE) 10 MG tablet Take 1 tablet (10 mg total) by mouth every 6 (six) hours as needed for nausea or vomiting. 08/31/18  Yes Curt Bears, MD  ranolazine (RANEXA) 1000 MG SR tablet Take 1 tablet (1,000 mg total) by mouth 2 (two) times daily. 05/24/17  Yes Hilty, Nadean Corwin, MD  rosuvastatin (CRESTOR) 20 MG tablet Take 1 tablet (20 mg total) by mouth daily. 07/26/18  Yes [name], MD  sucralfate (CARAFATE) 1 g tablet Take 1 tablet by mouth 4 (four) times daily -  before meals and at bedtime. 09/23/18  Yes [provider]  tamsulosin (FLOMAX) 0.4 MG CAPS capsule Take 1 capsule (0.4 mg total) by mouth daily. 07/25/18  Yes [name], MD  tiotropium (SPIRIVA HANDIHALER) 18 MCG inhalation capsule Place 1 capsule (18 mcg total) into inhaler and inhale daily. 04/20/18  Yes [name], MD  vitamin B-12 (CYANOCOBALAMIN) 500 MCG  tablet Take 1 tablet (500 mcg total) by mouth daily. 06/30/18  Yes Jamse Arn, MD  zolpidem (AMBIEN) 5 MG tablet Take 1 tablet (5 mg total) by mouth at bedtime as needed for sleep. 09/14/18  Yes [name], MD  cetirizine (ZYRTEC) 10 MG tablet Take 1 tablet (10 mg total) daily by mouth. Patient not taking: Reported on 10/02/2018 10/10/17   [name], MD  magic mouthwash SOLN Take 10 mLs by mouth 3 (three) times daily as needed for mouth pain. Patient not taking: Reported on 10/08/2018 09/24/18   Renita Papa, PA-C    Family History Family History  Problem Relation Age of Onset  . Heart attack Father   . Hypertension Mother   . Diabetes Mother   . Heart disease Brother        x 3   . Heart attack Brother        deceased  . Hypertension Sister   . Diabetes Sister   . Anesthesia problems Neg Hx   . Hypotension Neg Hx   . Malignant hyperthermia Neg Hx   . Pseudochol deficiency Neg Hx     Social History Social History   Tobacco Use  . Smoking status: Former Smoker    Packs/day: 0.25    Years: 25.00    Pack years: 6.25    Types: Cigarettes    Last attempt to quit: 05/24/2015    Years since quitting: 3.3  . Smokeless tobacco: Never Used  Substance Use Topics  . Alcohol use: No    Alcohol/week: 0.0 standard drinks  . Drug use: No     Allergies   Iohexol   Review of Systems Review of Systems  Constitutional: Positive for fatigue. Negative for chills and fever.  HENT: Negative for congestion and rhinorrhea.   Eyes: Negative for visual disturbance.  Respiratory: Negative for cough, shortness of breath and wheezing.   Cardiovascular: Negative for chest pain and leg swelling.  Gastrointestinal: Positive for nausea. Negative for abdominal pain, diarrhea and vomiting.  Genitourinary: Negative for dysuria and flank pain.  Musculoskeletal: Positive for arthralgias and myalgias. Negative for neck pain  and neck stiffness.  Skin: Negative for rash and wound.    Allergic/Immunologic: Negative for immunocompromised state.  Neurological: Positive for weakness. Negative for syncope and headaches.  All other systems reviewed and are negative.    Physical Exam Updated Vital Signs BP 120/89   Pulse 85   Temp 98.1 F (36.7 C) (Oral)   Resp 14   Ht 5' 9" (1.753 m)   SpO2 96%   BMI 33.70 kg/m   Physical Exam  Constitutional: He is oriented to person, place, and time. He appears well-developed and well-nourished. No distress.  Fatigued, ill but non-toxic  HENT:  Head: Normocephalic and atraumatic.  Mouth/Throat: Oropharynx is clear and moist.  Dry MM  Eyes: Conjunctivae are normal.  Neck: Neck supple.  Cardiovascular: Normal rate, regular rhythm and normal heart sounds. Exam reveals no friction rub.  No murmur heard. Pulmonary/Chest: Effort normal and breath sounds normal. No respiratory distress. He has no wheezes. He has no rales.  Port site c/d/i  Abdominal: Soft. He exhibits no distension. There is no tenderness.  Musculoskeletal: He exhibits no edema.  Neurological: He is alert and oriented to person, place, and time. He exhibits normal muscle tone.  Skin: Skin is warm. Capillary refill takes less than 2 seconds.  Psychiatric: He has a normal mood and affect.  Nursing note and vitals reviewed.    ED Treatments / Results  Labs (all labs ordered are listed, but only abnormal results are displayed) Labs Reviewed  BASIC METABOLIC PANEL - Abnormal; Notable for the following components:      Result Value   Potassium 3.3 (*)    Glucose, Bld 184 (*)    BUN 50 (*)    Creatinine, Ser 2.25 (*)    Calcium 8.7 (*)    GFR calc non Af Amer 32 (*)    GFR calc Af Amer 37 (*)    All other components within normal limits  CBC - Abnormal; Notable for the following components:   RBC 3.75 (*)    Hemoglobin 10.8 (*)    HCT 32.0 (*)    All other components within normal limits  MAGNESIUM - Abnormal; Notable for the following components:    Magnesium 1.6 (*)    All other components within normal limits  CBG MONITORING, ED - Abnormal; Notable for the following components:   Glucose-Capillary 184 (*)    All other components within normal limits  I-STAT CHEM 8, ED - Abnormal; Notable for the following components:   Potassium 3.4 (*)    BUN 43 (*)    Creatinine, Ser 2.50 (*)    Glucose, Bld 181 (*)    Calcium, Ion 1.14 (*)    Hemoglobin 10.5 (*)    HCT 31.0 (*)    All other components within normal limits  URINALYSIS, ROUTINE W REFLEX MICROSCOPIC  I-STAT CG4 LACTIC ACID, ED    EKG None  Radiology Dg Chest 2 View  Result Date: 10/08/2018 CLINICAL DATA:  Lung cancer, nausea, weakness, shortness of breath EXAM: CHEST - 2 VIEW COMPARISON:  09/24/2018 FINDINGS: Lungs are clear. No pleural effusion or pneumothorax. Prior mediastinal lymphadenopathy, particularly along the right paratracheal stripe, appears improved. The heart is normal in size. Left chest power port terminates in the lower SVC. Visualized osseous structures are within normal limits. IMPRESSION: No evidence of acute cardiopulmonary disease. Electronically Signed   By: Julian Hy M.D.   On: 10/08/2018 11:24    Procedures Procedures (including critical care time)  Medications Ordered  in ED Medications  lactated ringers bolus 1,000 mL ( Intravenous Rate/Dose Verify 10/08/18 1238)  potassium chloride 10 mEq in 100 mL IVPB ( Intravenous Rate/Dose Verify 10/08/18 1238)  diphenhydrAMINE (BENADRYL) capsule 25 mg (25 mg Oral Given 10/08/18 1223)  magnesium sulfate IVPB 2 g 50 mL (has no administration in time range)  lactated ringers bolus 1,000 mL (0 mLs Intravenous Stopped 10/08/18 1146)  HYDROmorphone (DILAUDID) injection 1 mg (1 mg Intravenous Given 10/08/18 1047)  ondansetron (ZOFRAN) injection 4 mg (4 mg Intravenous Given 10/08/18 1045)  HYDROmorphone (DILAUDID) injection 1 mg (1 mg Intravenous Given 10/08/18 1146)     Initial Impression /  Assessment and Plan / ED Course  I have reviewed the triage vital signs and the nursing notes.  Pertinent labs & imaging results that were available during my care of the patient were reviewed by me and considered in my medical decision making (see chart for details).   52 year old male with past medical history as above here with generalized weakness and dehydration.  He has significant acute kidney injury, hypok, hypomag likely 2/2 poor PO intake.  Will admit for hydration, fluids.  Final Clinical Impressions(s) / ED Diagnoses   Final diagnoses:  AKI (acute kidney injury) ([place])  Hypokalemia  Hypomagnesemia    ED Discharge Orders    None       Duffy Bruce, MD 10/08/18 1315

## 2018-10-08 NOTE — H&P (Signed)
History and Physical    OBET BYFORD WUJ:[id number] DOB: 30-Nov-1965 DOA: 10/08/2018  PCP: [name], MD   Patient coming from: Home.  I have personally briefly reviewed patient's old medical records in [facility]  Chief Complaint: Painful swallowing.  HPI: Andrew West is a 52 y.o. 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  His last chemotherapy treatment was Monday (6 days ago) and he had radiation the rest of the week.  He also complains of dyspnea, fatigue, decreased appetite, decreased bowel movements and urination.  He denies fever, chills, sore throat, typical chest pain, palpitations, dizziness, diaphoresis, PND, orthopnea or pitting edema of the lower extremities.  No melena or hematochezia.  He denies dysuria, frequency or hematuria.  No polyuria, polydipsia, polyphagia or blurred vision.  ED Course: Initial temperature 98.1 F, pulse 100, respirations 17, blood pressure 111/71 mmHg and O2 sat 98% on room air.  He received 2 L of LR bolus, Zofran 4 mg IVP, hydromorphone 1 mg IVP x2, Maalox plus Viscous Lidocaine in the emergency department.  His urinalysis shows mild glucosuria of 50 50 and proteinuria of 100 mg/dL.  There is large hemoglobinuria.  More than 50 RBC per hpf on microscopic exam.  White count was 4.4, hemoglobin 10.8 g/dL and platelets 956 213.  Lactic acid was normal.  Sodium 140, potassium 3.3, chloride 101 and CO2 28 mmol/L.  BUN is 50, creatinine 2.25, glucose 184, magnesium  1.6 and calcium 8.7 mg/dL.  His chest radiograph does not show any acute cardiopulmonary pathology.  Review of Systems: As per HPI otherwise 10 point review of systems negative.   Past Medical History:  Diagnosis Date  . Anxiety   . CAD (coronary artery disease)    a. s/p multiple PCIs with last cath 11/2016 with severe multivessel CAD, s/p PCTA to LCx but unable to pass stent  . Chronic leg pain    bilateral  . Chronic lower back pain   . COPD (chronic obstructive pulmonary disease) (HCC)   . Depression   . GERD (gastroesophageal reflux disease)    Takes Dexilant  . HLD (hyperlipidemia)   . Hypertension   . Rhabdomyolysis    h/o, r/t statins  . Sleep apnea    "can't tolerate mask" (12/16/2016)  . Type II diabetes mellitus (HCC)     Past Surgical History:  Procedure Laterality Date  . BACK SURGERY    . BRONCHIAL BIOPSY  08/25/2018   Procedure: BRONCHIAL BIOPSIES;  Surgeon: Josephine Igo, DO;  Location: [facility] ENDOSCOPY;  Service: Cardiopulmonary;;  . CARDIAC CATHETERIZATION N/A 09/25/2015   Procedure: Left Heart Cath and Coronary Angiography;  Surgeon: Marykay Lex, MD;  Location: [facility] INVASIVE CV LAB;  Service: Cardiovascular;  Laterality: N/A;  . CARDIAC CATHETERIZATION N/A 12/16/2016   Procedure: Left Heart Cath and Coronary Angiography;  Surgeon: Marykay Lex, MD;  Location: [facility] INVASIVE CV LAB;  Service: Cardiovascular;  Laterality: N/A;  . CARDIAC CATHETERIZATION N/A 12/16/2016   Procedure: Coronary Balloon Angioplasty;  Surgeon: Marykay Lex, MD;  Location: [facility]  INVASIVE CV LAB;  Service: Cardiovascular;  Laterality: N/A;  . COLONOSCOPY W/ POLYPECTOMY    . CORONARY ANGIOPLASTY  09/25/2015   mid cir & om  . CORONARY ANGIOPLASTY WITH STENT PLACEMENT  10/09/2001   PTCA & stenting of mid AV circumflex; 2.5x83mm Pixel stent  . CORONARY ANGIOPLASTY WITH STENT PLACEMENT  12/13/2001   PCI with stent to mid L circumflex, 95% stenosis to 0% residual  . CORONARY ANGIOPLASTY WITH  STENT PLACEMENT  10/10/2003   PCI to mid AV circumflex; LAD 30% disease; RCA 100% occluded prox.  . CORONARY ANGIOPLASTY WITH STENT PLACEMENT  09/01/2011   PCI with stenting with bare metal stent to mid AV groove circumflex and PDA  . CORONARY ANGIOPLASTY WITH STENT PLACEMENT  10/17/2011   cutting balloon angioplasty of ostial lateral OM1 branch and bifurcation AV groove circumflex OM junction; stenosis reduced to 0%  . ENDOBRONCHIAL ULTRASOUND Bilateral 08/25/2018   Procedure: ENDOBRONCHIAL ULTRASOUND;  Surgeon: Josephine Igo, DO;  Location: [facility] ENDOSCOPY;  Service: Cardiopulmonary;  Laterality: Bilateral;  . EXCISIONAL HEMORRHOIDECTOMY    . FINE NEEDLE ASPIRATION  08/25/2018   Procedure: FINE NEEDLE ASPIRATION;  Surgeon: Josephine Igo, DO;  Location: [facility] ENDOSCOPY;  Service: Cardiopulmonary;;  . FLEXIBLE BRONCHOSCOPY  08/25/2018   Procedure: FLEXIBLE BRONCHOSCOPY;  Surgeon: Josephine Igo, DO;  Location: [facility] ENDOSCOPY;  Service: Cardiopulmonary;;  . IR IMAGING GUIDED PORT INSERTION  09/29/2018  . LEFT HEART CATHETERIZATION WITH CORONARY ANGIOGRAM N/A 10/18/2011   Procedure: LEFT HEART CATHETERIZATION WITH CORONARY ANGIOGRAM;  Surgeon: Marykay Lex, MD;  Location: [facility] CATH LAB;  Service: Cardiovascular;  Laterality: N/A;  . LUMBAR LAMINECTOMY/DECOMPRESSION MICRODISCECTOMY  03/31/2012   Procedure: LUMBAR LAMINECTOMY/DECOMPRESSION MICRODISCECTOMY 1 LEVEL;  Surgeon: Temple Pacini, MD;  Location: [facility] NEURO ORS;  Service: Neurosurgery;  Laterality: Left;  . TRANSTHORACIC ECHOCARDIOGRAM  07/28/2011   EF 55-65%; LVH, grade 1 diastolic dysfunction;      reports that he quit smoking about 3 years ago. His smoking use included cigarettes. He has a 6.25 pack-year smoking history. He has never used smokeless tobacco. He reports that he does not drink alcohol or use drugs.  Allergies  Allergen Reactions  . Iohexol Anaphylaxis    PT. TO BE PREMEDICATED PRIOR TO IV CONTRAST PER DR Eppie Gibson  /MMS//12/15/15Desc: PT BECAME SOB AND CHEST TIGHTNESS AFTER CONTRAST INJECTION.  [name],RT-RCT., Onset Date: [id number]     Family History  Problem Relation Age of Onset  . Heart attack Father   . Hypertension Mother   . Diabetes Mother   . Heart disease Brother        x 3   . Heart attack Brother        deceased  . Hypertension Sister   . Diabetes Sister   . Anesthesia problems Neg Hx   . Hypotension Neg Hx   . Malignant hyperthermia Neg Hx   . Pseudochol deficiency Neg Hx    Prior to Admission medications   Medication Sig Start Date End Date Taking? Authorizing Provider  albuterol (PROAIR HFA) 108 (90 Base) MCG/ACT inhaler Inhale 1 puff into the lungs every 6 (six) hours as needed for wheezing or shortness of breath. 08/29/18  Yes [name], MD  amLODipine (NORVASC) 10 MG tablet Take 1 tablet (10 mg total) by mouth daily. 04/20/18  Yes [name], MD  aspirin 81 MG tablet Take 1 tablet (81 mg total) daily by mouth. 10/10/17  Yes Newlin, Enobong, MD  baclofen (LIORESAL) 10 MG tablet Take  1 tablet (10 mg total) by mouth 3 (three) times daily. Patient taking differently: Take 10 mg by mouth 3 (three) times daily as needed for muscle spasms.  06/30/18  Yes Marcello Fennel, MD  benzonatate (TESSALON) 100 MG capsule Take 1 capsule (100 mg total) by mouth every 8 (eight) hours. 09/19/18  Yes Si Gaul, MD  Blood Glucose Monitoring Suppl (ACCU-CHEK AVIVA PLUS) w/Device KIT Use as dircted 07/12/17  Yes [name], MD  Cholecalciferol (VITAMIN D) 2000 units tablet Take 1 tablet (2,000 Units total) by mouth daily. 08/04/18  Yes Marcello Fennel, MD  clopidogrel (PLAVIX) 75 MG tablet Take 1 tablet (75 mg total) by mouth daily. YOU MAY RESTART 08/26/2018 08/25/18  Yes Icard, Bradley L, DO  diclofenac sodium (VOLTAREN) 1 % GEL Apply 2 g topically 4 (four) times daily. 04/20/18  Yes [name], MD  FLUoxetine (PROZAC) 40 MG capsule Take 1 capsule (40 mg total) by mouth  daily. 04/20/18  Yes Newlin, Odette Horns, MD  fluticasone (FLONASE) 50 MCG/ACT nasal spray Place 2 sprays into both nostrils daily. Patient taking differently: Place 2 sprays into both nostrils daily as needed for allergies.  02/24/17  Yes McClung, Angela M, PA-C  Fluticasone-Salmeterol (ADVAIR) 100-50 MCG/DOSE AEPB Inhale 1 puff into the lungs 2 (two) times daily. 07/25/18  Yes [name], MD  furosemide (LASIX) 20 MG tablet Take 20 mg by mouth as needed for edema.    Yes [provider]  glucose blood (ACCU-CHEK AVIVA) test strip Use as instructed 07/12/17  Yes Newlin, Odette Horns, MD  hydrALAZINE (APRESOLINE) 25 MG tablet Take 1 tablet (25 mg total) by mouth 2 (two) times daily. 04/20/18  Yes [name], MD  HYDROcodone-acetaminophen (NORCO) 5-325 MG tablet Take 1-2 tablets by mouth every 6 (six) hours as needed for moderate pain. 09/29/18  Yes Dorothy Puffer, MD  hydrocortisone-pramoxine [facility]) 2.5-1 % rectal cream PLACE 1 APPLICATION RECTALLY 3 (THREE) TIMES DAILY. Patient taking differently: Place 1 application rectally as needed.  08/25/17  Yes [name], MD  isosorbide mononitrate (IMDUR) 120 MG 24 hr tablet Take 1 tablet (120 mg total) by mouth daily. 04/20/18  Yes [name], MD  lidocaine-prilocaine (EMLA) cream Apply 1 application topically as needed. Apply 1 tsp on skin over port site one hour prior to chemotherapy. Cover with plastic wrap. Do not rub in medication. 10/03/18  Yes Si Gaul, MD  LORazepam (ATIVAN) 0.5 MG tablet 1 tab po q 4-6 hours prn or 1 tab po 30 minutes prior to radiation 08/30/18  Yes Ronny Bacon, PA-C  losartan-hydrochlorothiazide (HYZAAR) 100-25 MG tablet Take 1 tablet by mouth daily. 04/20/18  Yes [name], MD  magic mouthwash SOLN Take 10 mLs by mouth 3 (three) times daily as needed for pain. 09/24/18  Yes [provider]  metFORMIN (GLUCOPHAGE) 1000 MG tablet Take 1 tablet (1,000 mg total) by mouth 2 (two) times  daily with a meal. 04/20/18  Yes Newlin, Enobong, MD  mirtazapine (REMERON) 30 MG tablet Take 1 tablet (30 mg total) by mouth at bedtime. 08/31/18  Yes Si Gaul, MD  Nebivolol HCl 20 MG TABS Take 1 tablet (20 mg total) by mouth daily. 01/26/18  Yes Hilty, Lisette Abu, MD  nicotine (NICODERM CQ) 21 mg/24hr patch Place 1 patch (21 mg total) onto the skin daily. 08/31/18  Yes Si Gaul, MD  nitroGLYCERIN (NITROSTAT) 0.4 MG SL tablet Place 1 tablet (0.4 mg total) under the tongue every 5 (five) minutes as needed for chest pain.  12/24/16  Yes Strader, [place] M, PA-C  ondansetron (ZOFRAN) 8 MG tablet Take 1 tablet (8 mg total) by mouth every 8 (eight) hours as needed for nausea or vomiting. 09/20/18  Yes Si Gaul, MD  pantoprazole (PROTONIX) 40 MG tablet Take 1 tablet (40 mg total) by mouth 2 (two) times daily. 04/20/18  Yes [name], MD  prazosin (MINIPRESS) 1 MG capsule Take 1 capsule (1 mg total) by mouth at bedtime. For nightmares 07/25/18  Yes [name], MD  prochlorperazine (COMPAZINE) 10 MG tablet Take 1 tablet (10 mg total) by mouth every 6 (six) hours as needed for nausea or vomiting. 08/31/18  Yes Si Gaul, MD  ranolazine (RANEXA) 1000 MG SR tablet Take 1 tablet (1,000 mg total) by mouth 2 (two) times daily. 05/24/17  Yes Hilty, Lisette Abu, MD  rosuvastatin (CRESTOR) 20 MG tablet Take 1 tablet (20 mg total) by mouth daily. 07/26/18  Yes [name], MD  sucralfate (CARAFATE) 1 g tablet Take 1 tablet by mouth 4 (four) times daily -  before meals and at bedtime. 09/23/18  Yes [provider]  tamsulosin (FLOMAX) 0.4 MG CAPS capsule Take 1 capsule (0.4 mg total) by mouth daily. 07/25/18  Yes [name], MD  tiotropium (SPIRIVA HANDIHALER) 18 MCG inhalation capsule Place 1 capsule (18 mcg total) into inhaler and inhale daily. 04/20/18  Yes [name], MD  vitamin B-12 (CYANOCOBALAMIN) 500 MCG tablet Take 1 tablet (500 mcg total) by mouth daily. 06/30/18   Yes Marcello Fennel, MD  zolpidem (AMBIEN) 5 MG tablet Take 1 tablet (5 mg total) by mouth at bedtime as needed for sleep. 09/14/18  Yes [name], MD  cetirizine (ZYRTEC) 10 MG tablet Take 1 tablet (10 mg total) daily by mouth. Patient not taking: Reported on 10/02/2018 10/10/17   [name], MD  magic mouthwash SOLN Take 10 mLs by mouth 3 (three) times daily as needed for mouth pain. Patient not taking: Reported on 10/08/2018 09/24/18   Jeanie Sewer, PA-C    Physical Exam: Vitals:   10/08/18 1130 10/08/18 1200 10/08/18 1230 10/08/18 1300  BP: 127/89 120/89 (!) 145/99 (!) 131/99  Pulse: 86 85 78 83  Resp: 11 14 15 15   Temp:      TempSrc:      SpO2: 95% 96% 92% 97%  Height:        Constitutional: NAD, calm, comfortable Eyes: PERRL, lids and conjunctivae mildly injected. ENMT: Mucous membranes are dry. Posterior pharynx clear of any exudate or lesions. Neck: normal, supple, no masses, no thyromegaly Respiratory: clear to auscultation bilaterally, no wheezing, no crackles. Normal respiratory effort. No accessory muscle use.  Chest: Port-A-Cath in place. Cardiovascular: Regular rate and rhythm, no murmurs / rubs / gallops. No extremity edema. 2+ pedal pulses. No carotid bruits.  Abdomen: Obese, no tenderness, no masses palpated. No hepatosplenomegaly. Bowel sounds positive.  Musculoskeletal: no clubbing / cyanosis. Good ROM, no contractures. Normal muscle tone.  Skin: no rashes, lesions, ulcers. No induration Neurologic: CN 2-12 grossly intact. Sensation intact, DTR normal. Strength 5/5 in all 4.  Psychiatric: Normal judgment and insight. Alert and oriented x 3. Normal mood.   Labs on Admission: I have personally reviewed following labs and imaging studies  CBC: Recent Labs  Lab 10/02/18 0855 10/08/18 1043 10/08/18 1051  WBC 4.6 4.4  --   NEUTROABS 2.8  --   --   HGB 11.3* 10.8* 10.5*  HCT 34.2* 32.0* 31.0*  MCV 83.4 85.3  --  PLT 332 362  --    Basic  Metabolic Panel: Recent Labs  Lab 10/02/18 0855 10/08/18 1000 10/08/18 1043 10/08/18 1051  NA 143  --  140 140  K 3.6  --  3.3* 3.4*  CL 104  --  101 101  CO2 30  --  28  --   GLUCOSE 101*  --  184* 181*  BUN 11  --  50* 43*  CREATININE 0.97  --  2.25* 2.50*  CALCIUM 9.4  --  8.7*  --   MG 1.7 1.6*  --   --    GFR: Estimated Creatinine Clearance: 41 mL/min (A) (by C-G formula based on SCr of 2.5 mg/dL (H)). Liver Function Tests: Recent Labs  Lab 10/02/18 0855  AST 14*  ALT 12  ALKPHOS 50  BILITOT 0.3  PROT 7.2  ALBUMIN 3.0*   No results for input(s): LIPASE, AMYLASE in the last 168 hours. No results for input(s): AMMONIA in the last 168 hours. Coagulation Profile: No results for input(s): INR, PROTIME in the last 168 hours. Cardiac Enzymes: No results for input(s): CKTOTAL, CKMB, CKMBINDEX, TROPONINI in the last 168 hours. BNP (last 3 results) No results for input(s): PROBNP in the last 8760 hours. HbA1C: No results for input(s): HGBA1C in the last 72 hours. CBG: Recent Labs  Lab 10/08/18 1048  GLUCAP 184*   Lipid Profile: No results for input(s): CHOL, HDL, LDLCALC, TRIG, CHOLHDL, LDLDIRECT in the last 72 hours. Thyroid Function Tests: No results for input(s): TSH, T4TOTAL, FREET4, T3FREE, THYROIDAB in the last 72 hours. Anemia Panel: No results for input(s): VITAMINB12, FOLATE, FERRITIN, TIBC, IRON, RETICCTPCT in the last 72 hours. Urine analysis:    Component Value Date/Time   COLORURINE AMBER (A) 01/23/2014 2333   APPEARANCEUR CLOUDY (A) 01/23/2014 2333   LABSPEC 1.032 (H) 01/23/2014 2333   PHURINE 5.5 01/23/2014 2333   GLUCOSEU NEGATIVE 01/23/2014 2333   GLUCOSEU NEGATIVE 12/30/2008 1531   HGBUR NEGATIVE 01/23/2014 2333   BILIRUBINUR small 10/10/2017 1455   KETONESUR NEGATIVE 01/23/2014 2333   PROTEINUR 30 10/10/2017 1455   PROTEINUR NEGATIVE 01/23/2014 2333   UROBILINOGEN 1.0 10/10/2017 1455   UROBILINOGEN 1.0 01/23/2014 2333   NITRITE  negative 10/10/2017 1455   NITRITE NEGATIVE 01/23/2014 2333   LEUKOCYTESUR Negative 10/10/2017 1455    Radiological Exams on Admission: Dg Chest 2 View  Result Date: 10/08/2018 CLINICAL DATA:  Lung cancer, nausea, weakness, shortness of breath EXAM: CHEST - 2 VIEW COMPARISON:  09/24/2018 FINDINGS: Lungs are clear. No pleural effusion or pneumothorax. Prior mediastinal lymphadenopathy, particularly along the right paratracheal stripe, appears improved. The heart is normal in size. Left chest power port terminates in the lower SVC. Visualized osseous structures are within normal limits. IMPRESSION: No evidence of acute cardiopulmonary disease. Electronically Signed   By: Charline Bills M.D.   On: 10/08/2018 11:24   02/26/2018 echocardiogram complete ------------------------------------------------------------------- LV EF: 55% -   60%  ------------------------------------------------------------------- Indications:      Chest pain 786.51.  ------------------------------------------------------------------- History:   PMH:   Chest pain.  Coronary artery disease.  Risk factors:  Hypertension. Dyslipidemia.  ------------------------------------------------------------------- Study Conclusions  - Left ventricle: The cavity size was normal. Wall thickness was   normal. Systolic function was normal. The estimated ejection   fraction was in the range of 55% to 60%. Possible hypokinesis of   the inferolateral myocardium. Doppler parameters are consistent   with abnormal left ventricular relaxation (grade 1 diastolic   dysfunction). - Mitral valve:  Calcified annulus. - Left atrium: The atrium was mildly dilated.  Impressions:  - Possible mild hypokinesis of the inferolateral wall with overall   normal LV systolic function; mild diastolic dysfunction; mild   LAE.   EKG: Independently reviewed.  Vent. rate 88 BPM PR interval * ms QRS duration 110 ms QT/QTc 389/471 ms P-R-T  axes 66 47 52 Sinus rhythm  Assessment/Plan Principal problem:   AKI (acute kidney injury) (HCC) Secondary to poor oral intake induced dehydration. Continue IV fluids. Continue clear liquids diet. Monitor intake and output. Follow-up renal function and electrolytes.  Active Problems:   Dysphagia Continue clear liquids for now. Continue analgesics. Check barium swallow in a.m. Consider GI evaluation depending on results.      Hypokalemia Replacing. Follow-up potassium level.    Hypomagnesemia Replacing. Follow-up level as needed.    Type 2 diabetes mellitus (HCC) CBG monitoring before meals and bedtime.    Anemia Unremarkable anemia panel earlier this year. Monitor hematocrit and hemoglobin.    HLD (hyperlipidemia) Hold statin for now    GERD Famotidine IV.    COPD with asthma (HCC) Continue supplemental oxygen. Bronchodilators as usual.    CAD S/P multiple PCI's Resume aspirin and Plavix. Hold Ranexa and beta-blocker.    Small cell carcinoma of upper lobe of right lung [facility]) Continue treatment per oncology.    Sleep apnea Does not tolerate CPAP.    DVT prophylaxis: Lovenox SQ. Code Status: Full code. Family Communication: Disposition Plan: Observation for IV hydration, pain management and GI evaluation. Consults called: Gastroenterology (). Admission status: Observation/telemetry.   Bobette Mo MD [facility] Pager [phone].  If 7PM-7AM, please contact night-coverage [email or web address] Password [facility]  10/08/2018, 1:39 PM   This document was prepared using Dragon voice recognition software and may contain some unintended transcription errors.

## 2018-10-08 NOTE — ED Notes (Signed)
Pt aware of need for a urine sample.

## 2018-10-08 NOTE — ED Notes (Signed)
ED TO INPATIENT HANDOFF REPORT  Name/Age/Gender Andrew West 52 y.o. male  Code Status    Code Status Orders  (From admission, onward)         Start     Ordered   10/08/18 1320  Full code  Continuous     10/08/18 1321        Code Status History    Date Active Date Inactive Code Status Order ID Comments User Context   02/25/2018 0208 02/26/2018 1955 Full Code [id number]  Gwynne Edinger, MD Inpatient   11/29/2017 0207 11/29/2017 1759 Full Code [id number]  Phillips Grout, MD ED   05/10/2017 0015 05/10/2017 1653 Full Code [id number]  Regino Bellow, DO ED   12/16/2016 1052 12/17/2016 1439 Full Code [id number]  Leonie Man, MD Inpatient   03/22/2016 1940 03/23/2016 1817 Full Code [id number]  Erlene Quan, PA-C ED   09/25/2015 1209 09/26/2015 1621 Full Code [id number]  Leonie Man, MD Inpatient   03/31/2012 1104 04/01/2012 0035 Full Code [id number]  Myrtie Hawk, RN Inpatient   10/17/2011 1726 10/18/2011 1912 Full Code [id number]  Etta Quill, RN Inpatient      Home/SNF/Other Home  Chief Complaint chemo pt-unable to eat;weak  Level of Care/Admitting Diagnosis ED Disposition    ED Disposition Condition [place]: [facility] [[id number]]  Level of Care: Telemetry [5]  Admit to tele based on following criteria: Other see comments  Comments: Multiple electrolyte abnormalities  Diagnosis: AKI (acute kidney injury) [facility]) [174081]  Admitting Physician: Reubin Milan [[id number]]  Attending Physician: Reubin Milan [[id number]]  PT Class (Do Not Modify): Observation [104]  PT Acc Code (Do Not Modify): Observation [10022]       Medical History Past Medical History:  Diagnosis Date  . Anxiety   . CAD (coronary artery disease)    a. s/p multiple PCIs with last cath 11/2016 with severe multivessel CAD, s/p PCTA to LCx but unable to pass stent  . Chronic leg pain    bilateral  . Chronic lower back pain   . COPD (chronic  obstructive pulmonary disease) ([place])   . Depression   . GERD (gastroesophageal reflux disease)    Takes Dexilant  . HLD (hyperlipidemia)   . Hypertension   . Rhabdomyolysis    h/o, r/t statins  . Sleep apnea    "can't tolerate mask" (12/16/2016)  . Type II diabetes mellitus (HCC)     Allergies Allergies  Allergen Reactions  . Iohexol Anaphylaxis    PT. TO BE PREMEDICATED PRIOR TO IV CONTRAST PER DR Kris Hartmann /MMS//12/15/15Desc: PT BECAME SOB AND CHEST TIGHTNESS AFTER CONTRAST INJECTION.  [name],RT-RCT., Onset Date: [id number]     IV Location/Drains/Wounds Patient Lines/Drains/Airways Status   Active Line/Drains/Airways    Name:   Placement date:   Placement time:   Site:   Days:   Implanted Port 09/29/18 Left Chest   09/29/18    1527    Chest   9          Labs/Imaging Results for orders placed or performed during the hospital encounter of 10/08/18 (from the past 48 hour(s))  Magnesium     Status: Abnormal   Collection Time: 10/08/18 10:00 AM  Result Value Ref Range   Magnesium 1.6 (L) 1.7 - 2.4 mg/dL    Comment: Performed at [facility], [place] [address], [place], [place] [address]  Basic metabolic panel     Status: Abnormal  Collection Time: 10/08/18 10:43 AM  Result Value Ref Range   Sodium 140 135 - 145 mmol/L   Potassium 3.3 (L) 3.5 - 5.1 mmol/L   Chloride 101 98 - 111 mmol/L   CO2 28 22 - 32 mmol/L   Glucose, Bld 184 (H) 70 - 99 mg/dL   BUN 50 (H) 6 - 20 mg/dL   Creatinine, Ser 2.25 (H) 0.61 - 1.24 mg/dL   Calcium 8.7 (L) 8.9 - 10.3 mg/dL   GFR calc non Af Amer 32 (L) >60 mL/min   GFR calc Af Amer 37 (L) >60 mL/min    Comment: (NOTE) The eGFR has been calculated using the CKD EPI equation. This calculation has not been validated in all clinical situations. eGFR's persistently <60 mL/min signify possible Chronic Kidney Disease.    Anion gap 11 5 - 15    Comment: Performed at [facility], [place] [address]  [place], [place] [address]  CBC     Status: Abnormal   Collection Time: 10/08/18 10:43 AM  Result Value Ref Range   WBC 4.4 4.0 - 10.5 K/uL   RBC 3.75 (L) 4.22 - 5.81 MIL/uL   Hemoglobin 10.8 (L) 13.0 - 17.0 g/dL   HCT 32.0 (L) 39.0 - 52.0 %   MCV 85.3 80.0 - 100.0 fL   MCH 28.8 26.0 - 34.0 pg   MCHC 33.8 30.0 - 36.0 g/dL   RDW 12.2 11.5 - 15.5 %   Platelets 362 150 - 400 K/uL   nRBC 0.0 0.0 - 0.2 %    Comment: Performed at [facility], [place] [address] [place], [place] [address]  CBG monitoring, ED     Status: Abnormal   Collection Time: 10/08/18 10:48 AM  Result Value Ref Range   Glucose-Capillary 184 (H) 70 - 99 mg/dL  I-Stat Chem 8, ED     Status: Abnormal   Collection Time: 10/08/18 10:51 AM  Result Value Ref Range   Sodium 140 135 - 145 mmol/L   Potassium 3.4 (L) 3.5 - 5.1 mmol/L   Chloride 101 98 - 111 mmol/L   BUN 43 (H) 6 - 20 mg/dL   Creatinine, Ser 2.50 (H) 0.61 - 1.24 mg/dL   Glucose, Bld 181 (H) 70 - 99 mg/dL   Calcium, Ion 1.14 (L) 1.15 - 1.40 mmol/L   TCO2 28 22 - 32 mmol/L   Hemoglobin 10.5 (L) 13.0 - 17.0 g/dL   HCT 31.0 (L) 39.0 - 52.0 %  I-Stat CG4 Lactic Acid, ED     Status: None   Collection Time: 10/08/18 10:52 AM  Result Value Ref Range   Lactic Acid, Venous 1.32 0.5 - 1.9 mmol/L   Dg Chest 2 View  Result Date: 10/08/2018 CLINICAL DATA:  Lung cancer, nausea, weakness, shortness of breath EXAM: CHEST - 2 VIEW COMPARISON:  09/24/2018 FINDINGS: Lungs are clear. No pleural effusion or pneumothorax. Prior mediastinal lymphadenopathy, particularly along the right paratracheal stripe, appears improved. The heart is normal in size. Left chest power port terminates in the lower SVC. Visualized osseous structures are within normal limits. IMPRESSION: No evidence of acute cardiopulmonary disease. Electronically Signed   By: Julian Hy M.D.   On: 10/08/2018 11:24   EKG Interpretation  Date/Time:  Sunday October 08 2018 10:17:58  EST Ventricular Rate:  88 PR Interval:    QRS Duration: 110 QT Interval:  389 QTC Calculation: 471 R Axis:   47 Text Interpretation:  Sinus rhythm No significant change since last  tracing Confirmed by Duffy Bruce [phone]) on 10/08/2018 1:18:52 PM   Pending Labs Unresulted Labs (From admission, onward)    Start     Ordered   10/15/18 0500  Creatinine, serum  (enoxaparin (LOVENOX)    CrCl < 30 ml/min)  Weekly,   R    Comments:  while on enoxaparin therapy.    10/08/18 1321   10/09/18 0500  HIV antibody (Routine Testing)  Tomorrow morning,   R     10/08/18 1321   10/09/18 0500  CBC WITH DIFFERENTIAL  Tomorrow morning,   R     10/08/18 1321   10/09/18 0500  Comprehensive metabolic panel  Tomorrow morning,   R     10/08/18 1321   10/08/18 0933  Urinalysis, Routine w reflex microscopic  Once,   STAT     10/08/18 0933          Vitals/Pain Today's Vitals   10/08/18 1215 10/08/18 1230 10/08/18 1300 10/08/18 1400  BP:  (!) 145/99 (!) 131/99 (!) 134/99  Pulse:  78 83 77  Resp:  15 15 15   Temp:      TempSrc:      SpO2:  92% 97% 93%  Height:      PainSc: 4        Isolation Precautions No active isolations  Medications Medications  potassium chloride 10 mEq in 100 mL IVPB (10 mEq Intravenous New Bag/Given 10/08/18 1316)  diphenhydrAMINE (BENADRYL) capsule 25 mg (25 mg Oral Given 10/08/18 1223)  magnesium sulfate IVPB 2 g 50 mL (2 g Intravenous New Bag/Given 10/08/18 1313)  alum & mag hydroxide-simeth (MAALOX/MYLANTA) 200-200-20 MG/5ML suspension 30 mL (has no administration in time range)    And  lidocaine (XYLOCAINE) 2 % viscous mouth solution 15 mL (has no administration in time range)  enoxaparin (LOVENOX) injection 30 mg (has no administration in time range)  0.9 %  sodium chloride infusion (has no administration in time range)  acetaminophen (TYLENOL) tablet 650 mg (has no administration in time range)    Or  acetaminophen (TYLENOL) suppository 650 mg (has no  administration in time range)  ondansetron (ZOFRAN) tablet 4 mg (has no administration in time range)    Or  ondansetron (ZOFRAN) injection 4 mg (has no administration in time range)  lactated ringers bolus 1,000 mL (0 mLs Intravenous Stopped 10/08/18 1146)  HYDROmorphone (DILAUDID) injection 1 mg (1 mg Intravenous Given 10/08/18 1047)  ondansetron (ZOFRAN) injection 4 mg (4 mg Intravenous Given 10/08/18 1045)  HYDROmorphone (DILAUDID) injection 1 mg (1 mg Intravenous Given 10/08/18 1146)  lactated ringers bolus 1,000 mL (0 mLs Intravenous Stopped 10/08/18 1316)    Mobility walks

## 2018-10-08 NOTE — ED Notes (Signed)
Attempted to call report floor RN giving out 10am medications.

## 2018-10-09 ENCOUNTER — Ambulatory Visit: Payer: Medicare Other

## 2018-10-09 ENCOUNTER — Inpatient Hospital Stay: Payer: Medicare Other

## 2018-10-09 ENCOUNTER — Observation Stay (HOSPITAL_COMMUNITY): Payer: Medicare Other

## 2018-10-09 DIAGNOSIS — R630 Anorexia: Secondary | ICD-10-CM

## 2018-10-09 DIAGNOSIS — Z7902 Long term (current) use of antithrombotics/antiplatelets: Secondary | ICD-10-CM | POA: Diagnosis not present

## 2018-10-09 DIAGNOSIS — E876 Hypokalemia: Secondary | ICD-10-CM | POA: Diagnosis present

## 2018-10-09 DIAGNOSIS — J449 Chronic obstructive pulmonary disease, unspecified: Secondary | ICD-10-CM | POA: Diagnosis present

## 2018-10-09 DIAGNOSIS — J309 Allergic rhinitis, unspecified: Secondary | ICD-10-CM | POA: Diagnosis present

## 2018-10-09 DIAGNOSIS — D649 Anemia, unspecified: Secondary | ICD-10-CM | POA: Diagnosis present

## 2018-10-09 DIAGNOSIS — F329 Major depressive disorder, single episode, unspecified: Secondary | ICD-10-CM | POA: Diagnosis present

## 2018-10-09 DIAGNOSIS — F419 Anxiety disorder, unspecified: Secondary | ICD-10-CM | POA: Diagnosis present

## 2018-10-09 DIAGNOSIS — C3490 Malignant neoplasm of unspecified part of unspecified bronchus or lung: Secondary | ICD-10-CM | POA: Diagnosis not present

## 2018-10-09 DIAGNOSIS — K219 Gastro-esophageal reflux disease without esophagitis: Secondary | ICD-10-CM | POA: Diagnosis present

## 2018-10-09 DIAGNOSIS — I10 Essential (primary) hypertension: Secondary | ICD-10-CM | POA: Diagnosis present

## 2018-10-09 DIAGNOSIS — E785 Hyperlipidemia, unspecified: Secondary | ICD-10-CM | POA: Diagnosis present

## 2018-10-09 DIAGNOSIS — G8929 Other chronic pain: Secondary | ICD-10-CM | POA: Diagnosis present

## 2018-10-09 DIAGNOSIS — R933 Abnormal findings on diagnostic imaging of other parts of digestive tract: Secondary | ICD-10-CM | POA: Diagnosis not present

## 2018-10-09 DIAGNOSIS — Z9221 Personal history of antineoplastic chemotherapy: Secondary | ICD-10-CM | POA: Diagnosis not present

## 2018-10-09 DIAGNOSIS — I2510 Atherosclerotic heart disease of native coronary artery without angina pectoris: Secondary | ICD-10-CM | POA: Diagnosis present

## 2018-10-09 DIAGNOSIS — C3411 Malignant neoplasm of upper lobe, right bronchus or lung: Secondary | ICD-10-CM | POA: Diagnosis not present

## 2018-10-09 DIAGNOSIS — R1310 Dysphagia, unspecified: Secondary | ICD-10-CM | POA: Diagnosis not present

## 2018-10-09 DIAGNOSIS — E860 Dehydration: Secondary | ICD-10-CM | POA: Diagnosis present

## 2018-10-09 DIAGNOSIS — G4733 Obstructive sleep apnea (adult) (pediatric): Secondary | ICD-10-CM | POA: Diagnosis present

## 2018-10-09 DIAGNOSIS — Z955 Presence of coronary angioplasty implant and graft: Secondary | ICD-10-CM | POA: Diagnosis not present

## 2018-10-09 DIAGNOSIS — Z923 Personal history of irradiation: Secondary | ICD-10-CM | POA: Diagnosis not present

## 2018-10-09 DIAGNOSIS — Z9861 Coronary angioplasty status: Secondary | ICD-10-CM | POA: Diagnosis not present

## 2018-10-09 DIAGNOSIS — N401 Enlarged prostate with lower urinary tract symptoms: Secondary | ICD-10-CM | POA: Diagnosis present

## 2018-10-09 DIAGNOSIS — N179 Acute kidney failure, unspecified: Secondary | ICD-10-CM | POA: Diagnosis not present

## 2018-10-09 DIAGNOSIS — E1142 Type 2 diabetes mellitus with diabetic polyneuropathy: Secondary | ICD-10-CM | POA: Diagnosis present

## 2018-10-09 DIAGNOSIS — R1319 Other dysphagia: Secondary | ICD-10-CM | POA: Diagnosis present

## 2018-10-09 LAB — COMPREHENSIVE METABOLIC PANEL
ALBUMIN: 2.7 g/dL — AB (ref 3.5–5.0)
ALT: 12 U/L (ref 0–44)
ANION GAP: 7 (ref 5–15)
AST: 16 U/L (ref 15–41)
Alkaline Phosphatase: 34 U/L — ABNORMAL LOW (ref 38–126)
BILIRUBIN TOTAL: 0.6 mg/dL (ref 0.3–1.2)
BUN: 40 mg/dL — ABNORMAL HIGH (ref 6–20)
CO2: 29 mmol/L (ref 22–32)
Calcium: 8.4 mg/dL — ABNORMAL LOW (ref 8.9–10.3)
Chloride: 105 mmol/L (ref 98–111)
Creatinine, Ser: 1.92 mg/dL — ABNORMAL HIGH (ref 0.61–1.24)
GFR calc Af Amer: 45 mL/min — ABNORMAL LOW (ref >60)
GFR calc non Af Amer: 38 mL/min — ABNORMAL LOW (ref >60)
GLUCOSE: 89 mg/dL (ref 70–99)
Potassium: 3.5 mmol/L (ref 3.5–5.1)
SODIUM: 141 mmol/L (ref 135–145)
TOTAL PROTEIN: 6 g/dL — AB (ref 6.5–8.1)

## 2018-10-09 LAB — GLUCOSE, CAPILLARY
GLUCOSE-CAPILLARY: 82 mg/dL (ref 70–99)
Glucose-Capillary: 86 mg/dL (ref 70–99)
Glucose-Capillary: 79 mg/dL (ref 70–99)
Glucose-Capillary: 110 mg/dL — ABNORMAL HIGH (ref 70–99)

## 2018-10-09 LAB — HIV ANTIBODY (ROUTINE TESTING W REFLEX): HIV Screen 4th Generation wRfx: NONREACTIVE

## 2018-10-09 LAB — CBC WITH DIFFERENTIAL/PLATELET
Abs Immature Granulocytes: 0.02 10*3/uL (ref 0.00–0.07)
BASOS ABS: 0 10*3/uL (ref 0.0–0.1)
Basophils Relative: 1 %
EOS ABS: 0 10*3/uL (ref 0.0–0.5)
Eosinophils Relative: 0 %
HEMATOCRIT: 28.5 % — AB (ref 39.0–52.0)
Hemoglobin: 9.3 g/dL — ABNORMAL LOW (ref 13.0–17.0)
IMMATURE GRANULOCYTES: 1 %
LYMPHS ABS: 0.2 10*3/uL — AB (ref 0.7–4.0)
Lymphocytes Relative: 11 %
MCH: 27.9 pg (ref 26.0–34.0)
MCHC: 32.6 g/dL (ref 30.0–36.0)
MCV: 85.6 fL (ref 80.0–100.0)
Monocytes Absolute: 0 10*3/uL — ABNORMAL LOW (ref 0.1–1.0)
Monocytes Relative: 2 %
NEUTROS PCT: 85 %
NRBC: 0 % (ref 0.0–0.2)
Neutro Abs: 1.7 10*3/uL (ref 1.7–7.7)
PLATELETS: 278 10*3/uL (ref 150–400)
RBC: 3.33 MIL/uL — AB (ref 4.22–5.81)
RDW: 12.3 % (ref 11.5–15.5)
WBC: 2 10*3/uL — AB (ref 4.0–10.5)

## 2018-10-09 MED ORDER — MAGIC MOUTHWASH W/LIDOCAINE
15.0000 mL | Freq: Three times a day (TID) | ORAL | Status: DC
  Administered 2018-10-09 – 2018-10-11 (×5): 15 mL via ORAL
  Filled 2018-10-09 (×9): qty 15

## 2018-10-09 MED ORDER — FLUTICASONE PROPIONATE 50 MCG/ACT NA SUSP: 2.0000 | Freq: Every day | NASAL | Status: DC | PRN

## 2018-10-09 MED ORDER — PANTOPRAZOLE SODIUM 40 MG PO TBEC
40.0000 mg | DELAYED_RELEASE_TABLET | Freq: Two times a day (BID) | ORAL | Status: DC
  Administered 2018-10-09 – 2018-10-11 (×4): 40 mg via ORAL
  Filled 2018-10-09 (×4): qty 1

## 2018-10-09 MED ORDER — MAGIC MOUTHWASH W/LIDOCAINE
5.0000 mL | Freq: Three times a day (TID) | ORAL | Status: DC
  Filled 2018-10-09: qty 5

## 2018-10-09 MED ORDER — DIPHENHYDRAMINE HCL 25 MG PO CAPS
25.0000 mg | ORAL_CAPSULE | Freq: Four times a day (QID) | ORAL | Status: DC | PRN
  Administered 2018-10-09 – 2018-10-10 (×3): 25 mg via ORAL
  Filled 2018-10-09 (×3): qty 1

## 2018-10-09 MED ORDER — SUCRALFATE 1 GM/10ML PO SUSP
1.0000 g | Freq: Three times a day (TID) | ORAL | Status: DC
  Administered 2018-10-09 – 2018-10-11 (×6): 1 g via ORAL
  Filled 2018-10-09 (×6): qty 10

## 2018-10-09 MED ORDER — DICLOFENAC SODIUM 1 % TD GEL: 2.0000 g | Freq: Four times a day (QID) | TRANSDERMAL | Status: DC | PRN

## 2018-10-09 MED ORDER — MOMETASONE FURO-FORMOTEROL FUM 100-5 MCG/ACT IN AERO
2.0000 | INHALATION_SPRAY | Freq: Two times a day (BID) | RESPIRATORY_TRACT | Status: DC
  Administered 2018-10-09 – 2018-10-11 (×5): 2 via RESPIRATORY_TRACT
  Filled 2018-10-09: qty 8.8

## 2018-10-09 MED ORDER — ASPIRIN EC 81 MG PO TBEC
81.0000 mg | DELAYED_RELEASE_TABLET | Freq: Every day | ORAL | Status: DC
  Filled 2018-10-09: qty 1

## 2018-10-09 MED ORDER — CLOPIDOGREL BISULFATE 75 MG PO TABS: 75.0000 mg | ORAL_TABLET | Freq: Every day | ORAL | Status: DC

## 2018-10-09 MED ORDER — NITROGLYCERIN 0.4 MG SL SUBL: 0.4000 mg | SUBLINGUAL_TABLET | SUBLINGUAL | Status: DC | PRN

## 2018-10-09 MED ORDER — ENOXAPARIN SODIUM 30 MG/0.3ML ~~LOC~~ SOLN
30.0000 mg | SUBCUTANEOUS | Status: DC
  Administered 2018-10-09 – 2018-10-10 (×2): 30 mg via SUBCUTANEOUS
  Filled 2018-10-09 (×2): qty 0.3

## 2018-10-09 MED ORDER — VITAMIN D 25 MCG (1000 UNIT) PO TABS
2000.0000 [IU] | ORAL_TABLET | Freq: Every day | ORAL | Status: DC
  Administered 2018-10-10 – 2018-10-11 (×2): 2000 [IU] via ORAL
  Filled 2018-10-09 (×2): qty 2

## 2018-10-09 MED ORDER — BACLOFEN 10 MG PO TABS: 10.0000 mg | ORAL_TABLET | Freq: Three times a day (TID) | ORAL | Status: DC | PRN

## 2018-10-09 MED ORDER — HYDROCORTISONE ACE-PRAMOXINE 2.5-1 % RE CREA: 1.0000 "application " | TOPICAL_CREAM | RECTAL | Status: DC | PRN

## 2018-10-09 MED ORDER — POTASSIUM CHLORIDE 10 MEQ/100ML IV SOLN
10.0000 meq | INTRAVENOUS | Status: AC
  Administered 2018-10-09 (×2): 10 meq via INTRAVENOUS
  Filled 2018-10-09 (×2): qty 100

## 2018-10-09 MED ORDER — DIPHENHYDRAMINE HCL 50 MG/ML IJ SOLN
12.5000 mg | Freq: Once | INTRAMUSCULAR | Status: AC
  Administered 2018-10-09: 12.5 mg via INTRAVENOUS
  Filled 2018-10-09: qty 1

## 2018-10-09 MED ORDER — NEBIVOLOL HCL 10 MG PO TABS
10.0000 mg | ORAL_TABLET | Freq: Every day | ORAL | Status: DC
  Administered 2018-10-09 – 2018-10-11 (×3): 10 mg via ORAL
  Filled 2018-10-09 (×3): qty 1

## 2018-10-09 MED ORDER — SODIUM CHLORIDE 0.9% FLUSH
10.0000 mL | INTRAVENOUS | Status: DC | PRN
  Administered 2018-10-11: 10 mL
  Filled 2018-10-09: qty 40

## 2018-10-09 NOTE — Consult Note (Addendum)
Referring Provider: [facility]  Primary Care Physician:  [name], MD Primary Gastroenterologist:   Silvano Rusk , MD (remote)     Reason for Consultation:  Odynophagia / dysphagia    ASSESSMENT  & PLAN   1. 52 yo male with small cell lung cancer presented with right paratracheal mass and right suprahilar mass. Undergoing chemotherapy / radiation.   2. Three to four week history of odynophagia / dysphagia / anorexia. Weight loss probably multifactorial but he is down from 250 pounds late October to 228 pounds on 10/02/18. No swallowing issues prior to starting chemo / radiation. Barium swallow shows smooth tapering of proximal esophagus, tapering of distal esophagus couldn't be excluded.  -Radiation esophagitis? Extrinsic compression? Combination of both?     -Will start Magic Mouthwash with lidocaine (with Nystatin and Maalox) TID -Clear liquids. NPO past midnight -Offered EGD. Patient would like to see how he feels in am.  -Not unreasonable to treat empirically with Diflucan in case of candida esophagitis.   3. AKI, improving  4. CAD / hx of multiple stents and angioplasty. On Plavix and ASA.    5. Asthma / sleep apnea     Attending physician's note   I have taken a history, examined the patient and reviewed the chart. I agree with the Advanced Practitioner's note, impression and recommendations.   Dysphagia, odynophagia, anorexia in setting of chemoradiation for small cell lung cancer. Esophagram shows 5.5 cm proximal esophageal smooth narrowing in region of paratracheal adenopathy. Narrowing is likely due to radiation esophagitis, possible extrinsic compression or both. He is maintained on Plavix so esophageal dilation would not be safe until 5 days off Plavix. EGD +/- biopsies could be performed earlier. Hold Plavix for now. Magic mouthwash with lidocaine qid. Carafate suspension qid. Protonix bid. Clear liquids with nutritional supplements.  Anorexia likely  related to chemoradiation, small cell lung cancer.   Lucio Edward, MD [facility] [phone]     HPI: Andrew West is a 52 y.o. male with multiple medical problems not limited to GERD, OSA, DM2, CAD s/p stenting / balloon angioplasty, HTN, and small cell lung cancer (T3, N3, MO) diagnosed Oct 2019. He started chemotherapy and radiation a couple of months ago. Approximately one month after starting treatment patient began having discomfort with swallowing. He describes sensation that throat is closing up and food takes a while to pass.  He was evaluated in ED for these symptoms on 09/24/18. Given dose of Rocephin empirically. Given magic mouthwash and viscous lidocaine with improvement in pain. Symptoms recurrent several days later and patient returned to ED yesterday. He hasn't had much PO intake in days. Last chemo was 10/02/18. Patient is adamant that he had no swallowing issues prior to starting chemo / radiation. Barium swallow with table showed a 5.5 cm smooth narrowing of proximal esophagus possibly related to radiation, less likely mass or mass effect from paratracheal adenopathy. Smooth narrowing of distal esophagus not excluded as distal esophagus could not be distended beyond about 12 mm.      Past Medical History:  Diagnosis Date  . Anxiety   . CAD (coronary artery disease)    a. s/p multiple PCIs with last cath 11/2016 with severe multivessel CAD, s/p PCTA to LCx but unable to pass stent  . Chronic leg pain    bilateral  . Chronic lower back pain   . COPD (chronic obstructive pulmonary disease) ([place])   . Depression   . GERD (gastroesophageal reflux disease)  Takes Dexilant  . HLD (hyperlipidemia)   . Hypertension   . Rhabdomyolysis    h/o, r/t statins  . Sleep apnea    "can't tolerate mask" (12/16/2016)  . Type II diabetes mellitus ([place])     Past Surgical History:  Procedure Laterality Date  . BACK SURGERY    . BRONCHIAL BIOPSY  08/25/2018   Procedure: BRONCHIAL  BIOPSIES;  Surgeon: Garner Nash, DO;  Location: [facility];  Service: Cardiopulmonary;;  . CARDIAC CATHETERIZATION N/A 09/25/2015   Procedure: Left Heart Cath and Coronary Angiography;  Surgeon: Leonie Man, MD;  Location: [employer] [facility];  Service: Cardiovascular;  Laterality: N/A;  . CARDIAC CATHETERIZATION N/A 12/16/2016   Procedure: Left Heart Cath and Coronary Angiography;  Surgeon: Leonie Man, MD;  Location: [place] CV LAB;  Service: Cardiovascular;  Laterality: N/A;  . CARDIAC CATHETERIZATION N/A 12/16/2016   Procedure: Coronary Balloon Angioplasty;  Surgeon: Leonie Man, MD;  Location: [place] CV LAB;  Service: Cardiovascular;  Laterality: N/A;  . COLONOSCOPY W/ POLYPECTOMY    . CORONARY ANGIOPLASTY  09/25/2015   mid cir & om  . CORONARY ANGIOPLASTY WITH STENT PLACEMENT  10/09/2001   PTCA & stenting of mid AV circumflex; 2.5x79m Pixel stent  . CORONARY ANGIOPLASTY WITH STENT PLACEMENT  12/13/2001   PCI with stent to mid L circumflex, 95% stenosis to 0% residual  . CORONARY ANGIOPLASTY WITH STENT PLACEMENT  10/10/2003   PCI to mid AV circumflex; LAD 30% disease; RCA 100% occluded prox.  . CORONARY ANGIOPLASTY WITH STENT PLACEMENT  09/01/2011   PCI with stenting with bare metal stent to mid AV groove circumflex and PDA  . CORONARY ANGIOPLASTY WITH STENT PLACEMENT  10/17/2011   cutting balloon angioplasty of ostial lateral OM1 branch and bifurcation AV groove circumflex OM junction; stenosis reduced to 0%  . ENDOBRONCHIAL ULTRASOUND Bilateral 08/25/2018   Procedure: ENDOBRONCHIAL ULTRASOUND;  Surgeon: IGarner Nash DO;  Location: [facility];  Service: Cardiopulmonary;  Laterality: Bilateral;  . EXCISIONAL HEMORRHOIDECTOMY    . FINE NEEDLE ASPIRATION  08/25/2018   Procedure: FINE NEEDLE ASPIRATION;  Surgeon: IGarner Nash DO;  Location: [facility];  Service: Cardiopulmonary;;  . FLEXIBLE BRONCHOSCOPY  08/25/2018   Procedure: FLEXIBLE BRONCHOSCOPY;   Surgeon: IGarner Nash DO;  Location: [facility];  Service: Cardiopulmonary;;  . IR IMAGING GUIDED PORT INSERTION  09/29/2018  . LEFT HEART CATHETERIZATION WITH CORONARY ANGIOGRAM N/A 10/18/2011   Procedure: LEFT HEART CATHETERIZATION WITH CORONARY ANGIOGRAM;  Surgeon: DLeonie Man MD;  [facility];  Service: Cardiovascular;  Laterality: N/A;  . LUMBAR LAMINECTOMY/DECOMPRESSION MICRODISCECTOMY  03/31/2012   Procedure: LUMBAR LAMINECTOMY/DECOMPRESSION MICRODISCECTOMY 1 LEVEL;  Surgeon: HCharlie Pitter MD;  [facility];  Service: Neurosurgery;  Laterality: Left;  . TRANSTHORACIC ECHOCARDIOGRAM  07/28/2011   EF 55-65%; LVH, grade 1 diastolic dysfunction;     Prior to Admission medications   Medication Sig Start Date End Date Taking? Authorizing Provider  albuterol (PROAIR HFA) 108 (90 Base) MCG/ACT inhaler Inhale 1 puff into the lungs every 6 (six) hours as needed for wheezing or shortness of breath. 08/29/18  Yes NCharlott Rakes MD  amLODipine (NORVASC) 10 MG tablet Take 1 tablet (10 mg total) by mouth daily. 04/20/18  Yes NCharlott Rakes MD  aspirin 81 MG tablet Take 1 tablet (81 mg total) daily by mouth. 10/10/17  Yes Newlin, Enobong, MD  baclofen (LIORESAL) 10 MG tablet Take 1 tablet (10 mg total) by mouth  3 (three) times daily. Patient taking differently: Take 10 mg by mouth 3 (three) times daily as needed for muscle spasms.  06/30/18  Yes Jamse Arn, MD  benzonatate (TESSALON) 100 MG capsule Take 1 capsule (100 mg total) by mouth every 8 (eight) hours. 09/19/18  Yes Curt Bears, MD  Blood Glucose Monitoring Suppl (ACCU-CHEK AVIVA PLUS) w/Device KIT Use as dircted 07/12/17  Yes [name], MD  Cholecalciferol (VITAMIN D) 2000 units tablet Take 1 tablet (2,000 Units total) by mouth daily. 08/04/18  Yes Jamse Arn, MD  clopidogrel (PLAVIX) 75 MG tablet Take 1 tablet (75 mg total) by mouth daily. YOU MAY RESTART 08/26/2018 08/25/18  Yes Icard, Bradley L,  DO  diclofenac sodium (VOLTAREN) 1 % GEL Apply 2 g topically 4 (four) times daily. 04/20/18  Yes [name], MD  FLUoxetine (PROZAC) 40 MG capsule Take 1 capsule (40 mg total) by mouth daily. 04/20/18  Yes Newlin, Charlane Ferretti, MD  fluticasone (FLONASE) 50 MCG/ACT nasal spray Place 2 sprays into both nostrils daily. Patient taking differently: Place 2 sprays into both nostrils daily as needed for allergies.  02/24/17  Yes McClung, Angela M, PA-C  Fluticasone-Salmeterol (ADVAIR) 100-50 MCG/DOSE AEPB Inhale 1 puff into the lungs 2 (two) times daily. 07/25/18  Yes [name], MD  furosemide (LASIX) 20 MG tablet Take 20 mg by mouth as needed for edema.    Yes [provider]  glucose blood (ACCU-CHEK AVIVA) test strip Use as instructed 07/12/17  Yes Newlin, Charlane Ferretti, MD  hydrALAZINE (APRESOLINE) 25 MG tablet Take 1 tablet (25 mg total) by mouth 2 (two) times daily. 04/20/18  Yes [name], MD  HYDROcodone-acetaminophen (NORCO) 5-325 MG tablet Take 1-2 tablets by mouth every 6 (six) hours as needed for moderate pain. 09/29/18  Yes Kyung Rudd, MD  hydrocortisone-pramoxine [facility]) 2.5-1 % rectal cream PLACE 1 APPLICATION RECTALLY 3 (THREE) TIMES DAILY. Patient taking differently: Place 1 application rectally as needed.  08/25/17  Yes [name], MD  isosorbide mononitrate (IMDUR) 120 MG 24 hr tablet Take 1 tablet (120 mg total) by mouth daily. 04/20/18  Yes [name], MD  lidocaine-prilocaine (EMLA) cream Apply 1 application topically as needed. Apply 1 tsp on skin over port site one hour prior to chemotherapy. Cover with plastic wrap. Do not rub in medication. 10/03/18  Yes Curt Bears, MD  LORazepam (ATIVAN) 0.5 MG tablet 1 tab po q 4-6 hours prn or 1 tab po 30 minutes prior to radiation 08/30/18  Yes Hayden Pedro, PA-C  losartan-hydrochlorothiazide (HYZAAR) 100-25 MG tablet Take 1 tablet by mouth daily. 04/20/18  Yes [name], MD  magic mouthwash SOLN Take 10  mLs by mouth 3 (three) times daily as needed for pain. 09/24/18  Yes [provider]  metFORMIN (GLUCOPHAGE) 1000 MG tablet Take 1 tablet (1,000 mg total) by mouth 2 (two) times daily with a meal. 04/20/18  Yes Newlin, Enobong, MD  mirtazapine (REMERON) 30 MG tablet Take 1 tablet (30 mg total) by mouth at bedtime. 08/31/18  Yes Curt Bears, MD  Nebivolol HCl 20 MG TABS Take 1 tablet (20 mg total) by mouth daily. 01/26/18  Yes Hilty, Nadean Corwin, MD  nicotine (NICODERM CQ) 21 mg/24hr patch Place 1 patch (21 mg total) onto the skin daily. 08/31/18  Yes Curt Bears, MD  nitroGLYCERIN (NITROSTAT) 0.4 MG SL tablet Place 1 tablet (0.4 mg total) under the tongue every 5 (five) minutes as needed for chest pain. 12/24/16  Yes Strader, Fransisco Hertz, PA-C  ondansetron (ZOFRAN) 8 MG tablet Take 1 tablet (8 mg total) by mouth every 8 (eight) hours as needed for nausea or vomiting. 09/20/18  Yes Curt Bears, MD  pantoprazole (PROTONIX) 40 MG tablet Take 1 tablet (40 mg total) by mouth 2 (two) times daily. 04/20/18  Yes [name], MD  prazosin (MINIPRESS) 1 MG capsule Take 1 capsule (1 mg total) by mouth at bedtime. For nightmares 07/25/18  Yes [name], MD  prochlorperazine (COMPAZINE) 10 MG tablet Take 1 tablet (10 mg total) by mouth every 6 (six) hours as needed for nausea or vomiting. 08/31/18  Yes Curt Bears, MD  ranolazine (RANEXA) 1000 MG SR tablet Take 1 tablet (1,000 mg total) by mouth 2 (two) times daily. 05/24/17  Yes Hilty, Nadean Corwin, MD  rosuvastatin (CRESTOR) 20 MG tablet Take 1 tablet (20 mg total) by mouth daily. 07/26/18  Yes [name], MD  sucralfate (CARAFATE) 1 g tablet Take 1 tablet by mouth 4 (four) times daily -  before meals and at bedtime. 09/23/18  Yes [provider]  tamsulosin (FLOMAX) 0.4 MG CAPS capsule Take 1 capsule (0.4 mg total) by mouth daily. 07/25/18  Yes [name], MD  tiotropium (SPIRIVA HANDIHALER) 18 MCG inhalation capsule Place 1  capsule (18 mcg total) into inhaler and inhale daily. 04/20/18  Yes [name], MD  vitamin B-12 (CYANOCOBALAMIN) 500 MCG tablet Take 1 tablet (500 mcg total) by mouth daily. 06/30/18  Yes Jamse Arn, MD  zolpidem (AMBIEN) 5 MG tablet Take 1 tablet (5 mg total) by mouth at bedtime as needed for sleep. 09/14/18  Yes [name], MD  cetirizine (ZYRTEC) 10 MG tablet Take 1 tablet (10 mg total) daily by mouth. Patient not taking: Reported on 10/02/2018 10/10/17   [name], MD  magic mouthwash SOLN Take 10 mLs by mouth 3 (three) times daily as needed for mouth pain. Patient not taking: Reported on 10/08/2018 09/24/18   Rodell Perna A, PA-C    Current Facility-Administered Medications  Medication Dose Route Frequency Provider Last Rate Last Dose  . 0.9 %  sodium chloride infusion   Intravenous Continuous Reubin Milan, MD 100 mL/hr at 10/09/18 1036    . acetaminophen (TYLENOL) tablet 650 mg  650 mg Oral Q6H PRN Reubin Milan, MD       Or  . acetaminophen (TYLENOL) suppository 650 mg  650 mg Rectal Q6H PRN Reubin Milan, MD      . baclofen (LIORESAL) tablet 10 mg  10 mg Oral TID PRN Reubin Milan, MD      . chlorhexidine (PERIDEX) 0.12 % solution 15 mL  15 mL Mouth Rinse BID Reubin Milan, MD   15 mL at 10/08/18 2150  . cholecalciferol (VITAMIN D3) tablet 2,000 Units  2,000 Units Oral Daily Reubin Milan, MD      . diclofenac sodium (VOLTAREN) 1 % transdermal gel 2 g  2 g Topical QID PRN Reubin Milan, MD      . Derrill Memo ON 10/10/2018] enoxaparin (LOVENOX) injection 30 mg  30 mg Subcutaneous Q24H Bonnielee Haff, MD      . feeding supplement (BOOST / RESOURCE BREEZE) liquid 1 Container  1 Container Oral TID BM Reubin Milan, MD   1 Container at 10/08/18 2002  . fluticasone (FLONASE) 50 MCG/ACT nasal spray 2 spray  2 spray Each Nare Daily PRN Reubin Milan, MD      . hydrocortisone-pramoxine [facility]) 2.5-1 % rectal cream 1  application  1 application Rectal PRN Reubin Milan, MD      . HYDROmorphone (DILAUDID) injection 1 mg  1 mg Intravenous Q4H PRN Reubin Milan, MD   1 mg at 10/09/18 0848  . MEDLINE mouth rinse  15 mL Mouth Rinse q12n4p Reubin Milan, MD   15 mL at 10/09/18 1123  . mometasone-formoterol (DULERA) 100-5 MCG/ACT inhaler 2 puff  2 puff Inhalation BID Reubin Milan, MD   2 puff at 10/09/18 0831  . nitroGLYCERIN (NITROSTAT) SL tablet 0.4 mg  0.4 mg Sublingual Q5 min PRN Reubin Milan, MD      . ondansetron [facility]) tablet 4 mg  4 mg Oral Q6H PRN Reubin Milan, MD       Or  . ondansetron [facility]) injection 4 mg  4 mg Intravenous Q6H PRN Reubin Milan, MD      . potassium chloride 10 mEq in 100 mL IVPB  10 mEq Intravenous Q1 Hr x 2 Bonnielee Haff, MD        Allergies as of 10/08/2018 - Review Complete 10/08/2018  Allergen Reaction Noted  . Iohexol Anaphylaxis 11/26/2008    Family History  Problem Relation Age of Onset  . Heart attack Father   . Hypertension Mother   . Diabetes Mother   . Heart disease Brother        x 3   . Heart attack Brother        deceased  . Hypertension Sister   . Diabetes Sister   . Anesthesia problems Neg Hx   . Hypotension Neg Hx   . Malignant hyperthermia Neg Hx   . Pseudochol deficiency Neg Hx     Social History   Socioeconomic History  . Marital status: Married    Spouse name: Not on file  . Number of children: Not on file  . Years of education: Not on file  . Highest education level: Not on file  Occupational History  . Not on file  Social Needs  . Financial resource strain: Not on file  . Food insecurity:    Worry: Not on file    Inability: Not on file  . Transportation needs:    Medical: No    Non-medical: No  Tobacco Use  . Smoking status: Former Smoker    Packs/day: 0.25    Years: 25.00    Pack years: 6.25    Types: Cigarettes    Last attempt to quit: 05/24/2015    Years since quitting: 3.3    . Smokeless tobacco: Never Used  Substance and Sexual Activity  . Alcohol use: No    Alcohol/week: 0.0 standard drinks  . Drug use: No  . Sexual activity: Yes  Lifestyle  . Physical activity:    Days per week: Not on file    Minutes per session: Not on file  . Stress: Not on file  Relationships  . Social connections:    Talks on phone: Not on file    Gets together: Not on file    Attends religious service: Not on file    Active member of club or organization: Not on file    Attends meetings of clubs or organizations: Not on file    Relationship status: Not on file  . Intimate partner violence:    Fear of current or ex partner: Not on file    Emotionally abused: Not on file    Physically abused: Not on file    Forced sexual activity: Not on  file  Other Topics Concern  . Not on file  Social History Narrative  . Not on file    Review of Systems: All systems reviewed and negative except where noted in HPI.  Physical Exam: Vital signs in last 24 hours: Temp:  [98.1 F (36.7 C)-99 F (37.2 C)] 98.1 F (36.7 C) (11/18 0429) Pulse Rate:  [72-86] 79 (11/18 0429) Resp:  [11-20] 20 (11/18 0429) BP: (120-145)/(79-99) 135/90 (11/18 0429) SpO2:  [92 %-97 %] 94 % (11/18 0831) Last BM Date: 10/08/18 General:   Alert, well-developed, male in NAD Psych:  Pleasant, cooperative. Normal mood and affect. Eyes:  Pupils equal, sclera clear, no icterus.   Conjunctiva pink. Ears:  Normal auditory acuity. Nose:  No deformity, discharge,  or lesions. Neck:  Supple; no masses Lungs:  Clear throughout to auscultation.   No wheezes, crackles, or rhonchi.  Heart:  Regular rate and rhythm; no murmurs, no lower extremity edema Abdomen:  Soft, non-distended, nontender, BS active, no palp mass    Rectal:  Deferred  Msk:  Symmetrical without gross deformities. . Neurologic:  Alert and  oriented x4;  grossly normal neurologically. Skin:  Intact without significant lesions or  rashes.   Intake/Output from previous day: 11/17 0701 - 11/18 0700 In: 3892.4 [P.O.:600; I.V.:900; IV Piggyback:2392.4] Out: -  Intake/Output this shift: No intake/output data recorded.  Lab Results: Recent Labs    10/08/18 1043 10/08/18 1051 10/09/18 0818  WBC 4.4  --  2.0*  HGB 10.8* 10.5* 9.3*  HCT 32.0* 31.0* 28.5*  PLT 362  --  278   BMET Recent Labs    10/08/18 1043 10/08/18 1051 10/09/18 0818  NA 140 140 141  K 3.3* 3.4* 3.5  CL 101 101 105  CO2 28  --  29  GLUCOSE 184* 181* 89  BUN 50* 43* 40*  CREATININE 2.25* 2.50* 1.92*  CALCIUM 8.7*  --  8.4*   LFT Recent Labs    10/09/18 0818  PROT 6.0*  ALBUMIN 2.7*  AST 16  ALT 12  ALKPHOS 34*  BILITOT 0.6   PT/INR No results for input(s): LABPROT, INR in the last 72 hours. Hepatitis Panel No results for input(s): HEPBSAG, HCVAB, HEPAIGM, HEPBIGM in the last 72 hours.    Studies/Results: Dg Chest 2 View  Result Date: 10/08/2018 CLINICAL DATA:  Lung cancer, nausea, weakness, shortness of breath EXAM: CHEST - 2 VIEW COMPARISON:  09/24/2018 FINDINGS: Lungs are clear. No pleural effusion or pneumothorax. Prior mediastinal lymphadenopathy, particularly along the right paratracheal stripe, appears improved. The heart is normal in size. Left chest power port terminates in the lower SVC. Visualized osseous structures are within normal limits. IMPRESSION: No evidence of acute cardiopulmonary disease. Electronically Signed   By: Julian Hy M.D.   On: 10/08/2018 11:24   Dg Esophagus  Result Date: 10/09/2018 CLINICAL DATA:  Sore throat and chest discomfort with sensation of food sticking. Odynophagia. Stage III lung cancer. EXAM: ESOPHOGRAM / BARIUM SWALLOW / BARIUM TABLET STUDY TECHNIQUE: Combined double contrast and single contrast examination performed using effervescent crystals, thick barium liquid, and thin barium liquid. The patient was observed with fluoroscopy swallowing a 13 mm barium sulphate tablet.  FLUOROSCOPY TIME:  Fluoroscopy Time:  2 minutes, 24 seconds Radiation Exposure Index (if provided by the fluoroscopic device): 15.2 mGy Number of Acquired Spot Images: 0 COMPARISON:  PET-CT from 08/21/2018 FINDINGS: During the pharyngeal phase of contrast, there was mild vallecular and piriform stasis. Primary peristaltic waves were intact on 4/4  swallows. Starting in the upper thoracic esophagus and extending over at about 5.5 cm length, there is a smoothly marginated narrowing of the proximal esophagus at which I could only distend the esophagus up to about 12 mm in one projection in 10 mm and another. The 13 mm barium tablet impacted in this vicinity in would not progressed despite numerous swallows. In addition, I was unable to fully distend the distal esophagus, maximal distention was about 12 mm. Because the pill did not pass from the proximal esophagus, I was unable to challenge the distal esophagus with the pill. There is atherosclerotic calcification of the aortic arch. IMPRESSION: 1. Approximately 5.5 cm smooth narrowing of the proximal esophagus. This is near the region of the patient's right paratracheal adenopathy. The narrowing could be related to radiation therapy or, less likely, mass effect from the paratracheal adenopathy on the trachea causing compression of the esophagus between the trachea and the aorta. 2. I was not able to distend the distal esophagus beyond about 12 mm, and accordingly a smooth narrowing in the distal esophagus is likewise not excluded. 3.  Aortic Atherosclerosis (ICD10-I70.0). Electronically Signed   By: Van Clines M.D.   On: 10/09/2018 10:12    Tye Savoy, NP-C @  10/09/2018, 11:24 AM

## 2018-10-09 NOTE — Care Management Note (Signed)
Case Management Note  Patient Details  Name: DE JAWORSKI MRN: [id number] Date of Birth: 1965-12-09  Subjective/Objective:AKI. From home. CM referral for meds asst. Patient pcp-[facility]-has pcp(can make own pcp f/u appt), & pharmacy,has access to healthcare card-able t get meds @ reasonable cost-there is no trouble affording meds. Has health insurance w/co pay. Has own transp home. No further CM needs.                    Action/Plan:d/c home    Expected Discharge Date:                  Expected Discharge Plan:  Home/Self Care  In-House Referral:     Discharge planning Services  CM Consult  Post Acute Care Choice:    Choice offered to:     DME Arranged:    DME Agency:     HH Arranged:    HH Agency:     Status of Service:  In process, will continue to follow  If discussed at Long Length of Stay Meetings, dates discussed:    Additional Comments:  Dessa Phi, RN 10/09/2018, 12:15 PM

## 2018-10-09 NOTE — Progress Notes (Signed)
[facility] PROGRESS NOTE  Andrew West AOZ:[id number] DOB: 1966-04-09 DOA: 10/08/2018  PCP: [name], MD  Brief History/Interval Summary: A 52 year old African-American male with a past medical history of coronary artery disease followed by Dr., chronic bilateral leg pain, chronic low back pain, anxiety disorder, depression, essential hypertension, type 2 diabetes mellitus who was recently diagnosed with small cell lung cancer and started on chemotherapy and radiation treatment.  This first cycle when he started his second cycle on 11/13.  He has also been getting radiation treatments.  He presented with a few day history of generalized weakness, difficulty swallowing and painful swallowing.  He has lost a unspecified amount of weight.  Evaluation revealed acute renal failure.  He was hospitalized for further management.  Reason for Visit: Odynophagia, dysphagia  Consultants: None yet  Procedures: None  Antibiotics: None  Subjective/Interval History: Patient states that he has difficulty swallowing food and water equally.  He feels as if food is getting stuck in the mid chest area but it does go down.  He also has a lot of pain when he swallows.  He also reports loss of taste.  ROS: Denies any shortness of breath.  No nausea vomiting.  Objective:  Vital Signs  Vitals:   10/08/18 1512 10/08/18 2153 10/09/18 0429 10/09/18 0831  BP: 128/85 124/79 135/90   Pulse: 79 72 79   Resp: 20 16 20    Temp: 99 F (37.2 C) 98.8 F (37.1 C) 98.1 F (36.7 C)   TempSrc: Oral Oral Oral   SpO2: 97% 94% 93% 94%  Height:        Intake/Output Summary (Last 24 hours) at 10/09/2018 1011 Last data filed at 10/09/2018 0441 Gross per 24 hour  Intake 3892.37 ml  Output -  Net 3892.37 ml   There were no vitals filed for this visit.  General appearance: alert, cooperative, appears stated age and no distress Head: Normocephalic, without obvious abnormality, atraumatic Resp: Is  bilaterally.  No definite wheezing or rhonchi.  Normal effort at rest. Cardio: regular rate and rhythm, S1, S2 normal, no murmur, click, rub or gallop GI: soft, non-tender; bowel sounds normal; no masses,  no organomegaly Extremities: extremities normal, atraumatic, no cyanosis or edema Pulses: 2+ and symmetric Neurologic: Alert and oriented x3.  No obvious focal neurological deficits appreciated.  Lab Results:  Data Reviewed: I have personally reviewed following labs and imaging studies  CBC: Recent Labs  Lab 10/08/18 1043 10/08/18 1051 10/09/18 0818  WBC 4.4  --  2.0*  NEUTROABS  --   --  1.7  HGB 10.8* 10.5* 9.3*  HCT 32.0* 31.0* 28.5*  MCV 85.3  --  85.6  PLT 362  --  278    Basic Metabolic Panel: Recent Labs  Lab 10/08/18 1000 10/08/18 1043 10/08/18 1051 10/09/18 0818  NA  --  140 140 141  K  --  3.3* 3.4* 3.5  CL  --  101 101 105  CO2  --  28  --  29  GLUCOSE  --  184* 181* 89  BUN  --  50* 43* 40*  CREATININE  --  2.25* 2.50* 1.92*  CALCIUM  --  8.7*  --  8.4*  MG 1.6*  --   --   --     GFR: Estimated Creatinine Clearance: 53.3 mL/min (A) (by C-G formula based on SCr of 1.92 mg/dL (H)).  Liver Function Tests: Recent Labs  Lab 10/09/18 0818  AST 16  ALT 12  ALKPHOS 34*  BILITOT 0.6  PROT 6.0*  ALBUMIN 2.7*    CBG: Recent Labs  Lab 10/08/18 1048 10/08/18 1639 10/08/18 2153 10/09/18 0730  GLUCAP 184* 90 149* 86      Radiology Studies: Dg Chest 2 View  Result Date: 10/08/2018 CLINICAL DATA:  Lung cancer, nausea, weakness, shortness of breath EXAM: CHEST - 2 VIEW COMPARISON:  09/24/2018 FINDINGS: Lungs are clear. No pleural effusion or pneumothorax. Prior mediastinal lymphadenopathy, particularly along the right paratracheal stripe, appears improved. The heart is normal in size. Left chest power port terminates in the lower SVC. Visualized osseous structures are within normal limits. IMPRESSION: No evidence of acute cardiopulmonary disease.  Electronically Signed   By: Charline Bills M.D.   On: 10/08/2018 11:24     Medications:  Scheduled: . aspirin EC  81 mg Oral Daily  . chlorhexidine  15 mL Mouth Rinse BID  . cholecalciferol  2,000 Units Oral Daily  . clopidogrel  75 mg Oral Daily  . enoxaparin (LOVENOX) injection  30 mg Subcutaneous Q24H  . feeding supplement  1 Container Oral TID BM  . mouth rinse  15 mL Mouth Rinse q12n4p  . mometasone-formoterol  2 puff Inhalation BID   Continuous: . sodium chloride 100 mL/hr at 10/09/18 0013   ZOX:[id number] **OR** acetaminophen, baclofen, diclofenac sodium, diphenhydrAMINE, fluticasone, hydrocortisone-pramoxine, HYDROmorphone (DILAUDID) injection, nitroGLYCERIN, ondansetron **OR** ondansetron (ZOFRAN) IV    Assessment/Plan:  Acute kidney injury This is most likely prerenal azotemia as a result of poor oral intake.  His dysphagia contributed to poor oral intake.  He has been on chemotherapy as well.  Patient started on IV fluids.  Renal function has been improving.  He is passing urine.  Continue IV fluids for now.  Continue to monitor renal function daily.  Continue to monitor urine output.  Dysphagia and odynophagia Patient has been receiving radiation treatment for his lung cancer.  High suspicion for radiation esophagitis.  A barium swallow was ordered by the admitting provider.  Will wait for the results of those.  Will likely need to involve GI.  Currently he is n.p.o. ADDENDUM: Esophagogram report reviewed.  Concern for a stricture in the esophagus.  Will discuss with gastroenterology.  Leave him n.p.o. for now.  Small cell lung cancer Oncology has been flagged on epic.  Received start of cycle 2 chemotherapy on 11/13.  He has been getting radiation treatments as well.  Hypokalemia and hypomagnesemia Potassium repleted.  3.5 this morning.  He will be given additional doses.  Normocytic anemia Likely due to chronic disease.  Mild drop in hemoglobin is likely  dilutional.  Diabetes mellitus type 2, controlled Monitor CBGs.  Sliding scale coverage.  HbA1c 5.7 in September  History of coronary artery disease He has had stent placements previously to left circumflex and obtuse marginal branch.  He underwent cardiac catheterization in January 2018 which showed 100% occluded OM 200% occluded left circumflex which was treated with PTCA.  He has been on aspirin Plavix statin Ranexa beta-blocker and nitrates.  Seems to be stable from a cardiac standpoint.  Continue home medications.  History of COPD with asthma Stable.  Continue bronchodilators as needed.  History of GERD Continue Pepcid  Sleep apnea Patient does not tolerate CPAP.  DVT Prophylaxis: Lovenox    Code Status: Full code Family Communication: Discussed with the patient and his wife Disposition Plan: Management as outlined above.    LOS: 0 days   [employer]  [facility] Pager [phone] 10/09/2018, 10:11  AM  If 7PM-7AM, please contact night-coverage at [email or web address], password [facility]

## 2018-10-10 ENCOUNTER — Ambulatory Visit
Admission: RE | Admit: 2018-10-10 | Discharge: 2018-10-10 | Disposition: A | Discharge status: Home / Self Care | Payer: Medicare Other | Source: Ambulatory Visit | Attending: Radiation Oncology | Admitting: Radiation Oncology

## 2018-10-10 DIAGNOSIS — C3411 Malignant neoplasm of upper lobe, right bronchus or lung: Secondary | ICD-10-CM | POA: Diagnosis not present

## 2018-10-10 LAB — CBC
HCT: 29.8 % — ABNORMAL LOW (ref 39.0–52.0)
Hemoglobin: 10 g/dL — ABNORMAL LOW (ref 13.0–17.0)
MCH: 29.2 pg (ref 26.0–34.0)
MCHC: 33.6 g/dL (ref 30.0–36.0)
MCV: 87.1 fL (ref 80.0–100.0)
NRBC: 0 % (ref 0.0–0.2)
PLATELETS: 238 10*3/uL (ref 150–400)
RBC: 3.42 MIL/uL — ABNORMAL LOW (ref 4.22–5.81)
RDW: 12.1 % (ref 11.5–15.5)
WBC: 1.8 10*3/uL — AB (ref 4.0–10.5)

## 2018-10-10 LAB — GLUCOSE, CAPILLARY
GLUCOSE-CAPILLARY: 87 mg/dL (ref 70–99)
GLUCOSE-CAPILLARY: 94 mg/dL (ref 70–99)
GLUCOSE-CAPILLARY: 111 mg/dL — AB (ref 70–99)
Glucose-Capillary: 84 mg/dL (ref 70–99)

## 2018-10-10 LAB — BASIC METABOLIC PANEL
ANION GAP: 10 (ref 5–15)
BUN: 29 mg/dL — ABNORMAL HIGH (ref 6–20)
CALCIUM: 8.3 mg/dL — AB (ref 8.9–10.3)
CO2: 27 mmol/L (ref 22–32)
CREATININE: 1.6 mg/dL — AB (ref 0.61–1.24)
Chloride: 105 mmol/L (ref 98–111)
GFR, EST AFRICAN AMERICAN: 56 mL/min — AB (ref >60)
GFR, EST NON AFRICAN AMERICAN: 48 mL/min — AB (ref >60)
Glucose, Bld: 85 mg/dL (ref 70–99)
Potassium: 3.7 mmol/L (ref 3.5–5.1)
SODIUM: 142 mmol/L (ref 135–145)

## 2018-10-10 LAB — MAGNESIUM: Magnesium: 1.4 mg/dL — ABNORMAL LOW (ref 1.7–2.4)

## 2018-10-10 MED ORDER — ZOLPIDEM TARTRATE 5 MG PO TABS
5.0000 mg | ORAL_TABLET | Freq: Every evening | ORAL | Status: DC | PRN
  Administered 2018-10-10: 5 mg via ORAL
  Filled 2018-10-10: qty 1

## 2018-10-10 MED ORDER — ENSURE ENLIVE PO LIQD
237.0000 mL | Freq: Two times a day (BID) | ORAL | Status: DC
  Administered 2018-10-10 – 2018-10-11 (×2): 237 mL via ORAL

## 2018-10-10 MED ORDER — BOOST / RESOURCE BREEZE PO LIQD CUSTOM: 1.0000 | Freq: Two times a day (BID) | ORAL | Status: DC

## 2018-10-10 MED ORDER — ADULT MULTIVITAMIN W/MINERALS CH
1.0000 | ORAL_TABLET | Freq: Every day | ORAL | Status: DC
  Administered 2018-10-10 – 2018-10-11 (×2): 1 via ORAL
  Filled 2018-10-10 (×2): qty 1

## 2018-10-10 MED ORDER — ENSURE ENLIVE PO LIQD: 237.0000 mL | Freq: Two times a day (BID) | ORAL | Status: DC

## 2018-10-10 MED ORDER — MAGNESIUM SULFATE 2 GM/50ML IV SOLN
2.0000 g | Freq: Once | INTRAVENOUS | Status: AC
  Administered 2018-10-10: 2 g via INTRAVENOUS
  Filled 2018-10-10: qty 50

## 2018-10-10 NOTE — Progress Notes (Signed)
Initial Nutrition Assessment  DOCUMENTATION CODES:   (Will assess for malnutrition at follow-up.)  INTERVENTION:  - Will order Ensure Enlive po BID, each supplement provides 350 kcal and 20 grams of protein - Will order daily multivitamin with minerals.  - Continue Boost Breeze but will decrease to BID, each supplement provides 250 kcal and 9 grams of protein. - Continue to encourage PO intakes. - Will do NFPE at follow-up.    NUTRITION DIAGNOSIS:   Increased nutrient needs related to catabolic illness, chronic illness, cancer and cancer related treatments as evidenced by estimated needs.  GOAL:   Patient will meet greater than or equal to 90% of their needs  MONITOR:   PO intake, Supplement acceptance, Weight trends, Labs  REASON FOR ASSESSMENT:   Malnutrition Screening Tool  ASSESSMENT:   52 year old African-American male with a past medical history of CAD, chronic bilateral leg pain, chronic low back pain, anxiety disorder, depression, essential HTN, type 2 DM. He was recently dx with small cell lung cancer and started on chemotherapy and radiation treatment.  He started his second cycle of chemo on 11/13. He presented with a few day history of generalized weakness, difficulty swallowing and painful swallowing. Evaluation revealed acute renal failure. He was hospitalized for further management.  BMI indicates obesity. Unable to talk with patient at time of attempted visit. Per chart review, patient ate 10% of dinner on 10/17. Notes indicates that patient has been experiencing odynophagia, dysphagia, and anorexia. GI consulted on 11/18 and possible need for EGD in the future.   Per chart review, current weight is 228 lb and weight on 10/10 was 245 lb. This indicates 17 lb weight loss (7% body weight) in the past 1 month; significant for time frame. Highly suspect some degree of malnutrition, but unable to confirm without NFPE/talking with patient. Patient has accepted 3 of the 6  cartons of Boost Breeze.    Medications reviewed; 2000 units vitamin D3/day, 2 g IV Mg sulfate x1 run today, 40 mg oral Protonix BID, 1 g Carafate TID.  Labs reviewed; CBGs: 87 and 94 mg/dL today, BUN: 29 mg/dL, creatinine: 1.6 mg/dL, Ca: 8.3 mg/dL, Mg: 1.4 mg/dL, GFR: 48 mL/min.  IVF; NS @ 75 mL/hr.      NUTRITION - FOCUSED PHYSICAL EXAM:  Will attempt at follow-up.  Diet Order:   Diet Order            Diet NPO time specified  Diet effective midnight        Diet full liquid Room service appropriate? Yes; Fluid consistency: Thin  Diet effective now              EDUCATION NEEDS:   No education needs have been identified at this time  Skin:  Skin Assessment: Reviewed RN Assessment  Last BM:  11/17  Height:   Ht Readings from Last 1 Encounters:  10/08/18 5\' 9"  (1.753 m)    Weight:   Wt Readings from Last 1 Encounters:  10/02/18 103.5 kg    Ideal Body Weight:  72.73 kg  BMI:  Body mass index is 33.7 kg/m.  Estimated Nutritional Needs:   Kcal:  2280-2590 kcal  Protein:  125-135 grams  Fluid:  >/= 2.2 L/day     Jarome Matin, MS, RD, LDN, [facility] Inpatient Clinical Dietitian Pager # [phone] After hours/weekend pager # [phone]

## 2018-10-10 NOTE — Progress Notes (Signed)
[facility] PROGRESS NOTE  Andrew West:[id number] DOB: 12-15-1965 DOA: 10/08/2018  PCP: [name], MD  Brief History/Interval Summary: A 52 year old African-American male with a past medical history of coronary artery disease followed by Dr., chronic bilateral leg pain, chronic low back pain, anxiety disorder, depression, essential hypertension, type 2 diabetes mellitus who was recently diagnosed with small cell lung cancer and started on chemotherapy and radiation treatment.  This first cycle when he started his second cycle on 11/13.  He has also been getting radiation treatments.  He presented with a few day history of generalized weakness, difficulty swallowing and painful swallowing.  He has lost a unspecified amount of weight.  Evaluation revealed acute renal failure.  He was hospitalized for further management.  Reason for Visit: Odynophagia, dysphagia  Consultants: Gastroenterology  Procedures: None  Antibiotics: None  Subjective/Interval History: Patient states that he feels slightly better.  Less pain with swallowing although the symptoms are still present.  Denies any nausea or vomiting.  No other complaints offered.    ROS: Denies any chest pain or shortness of breath.  Objective:  Vital Signs  Vitals:   10/09/18 1348 10/09/18 1849 10/09/18 2236 10/09/18 2240  BP: (!) 150/103 (!) 139/95  (!) 158/101  Pulse: 73 77  75  Resp:    17  Temp:  99 F (37.2 C)  99.3 F (37.4 C)  TempSrc:  Oral  Oral  SpO2:  92% 93% 93%  Height:        Intake/Output Summary (Last 24 hours) at 10/10/2018 0738 Last data filed at 10/10/2018 0400 Gross per 24 hour  Intake 2211.09 ml  Output 4 ml  Net 2207.09 ml   There were no vitals filed for this visit.  General appearance: He is awake alert.  In no distress. Resp: Clear to auscultation bilaterally.  Normal effort at rest. Cardio: S1-S2 is normal regular.  No S3-S4. GI: Abdomen is soft.  Nontender  nondistended Extremities: No edema Neurologic: Alert and oriented x3.  No obvious focal neurological deficits.  Lab Results:  Data Reviewed: I have personally reviewed following labs and imaging studies  CBC: Recent Labs  Lab 10/08/18 1043 10/08/18 1051 10/09/18 0818 10/10/18 0354  WBC 4.4  --  2.0* 1.8*  NEUTROABS  --   --  1.7  --   HGB 10.8* 10.5* 9.3* 10.0*  HCT 32.0* 31.0* 28.5* 29.8*  MCV 85.3  --  85.6 87.1  PLT 362  --  278 238    Basic Metabolic Panel: Recent Labs  Lab 10/08/18 1000 10/08/18 1043 10/08/18 1051 10/09/18 0818 10/10/18 0354  NA  --  140 140 141 142  K  --  3.3* 3.4* 3.5 3.7  CL  --  101 101 105 105  CO2  --  28  --  29 27  GLUCOSE  --  184* 181* 89 85  BUN  --  50* 43* 40* 29*  CREATININE  --  2.25* 2.50* 1.92* 1.60*  CALCIUM  --  8.7*  --  8.4* 8.3*  MG 1.6*  --   --   --  1.4*    GFR: Estimated Creatinine Clearance: 64 mL/min (A) (by C-G formula based on SCr of 1.6 mg/dL (H)).  Liver Function Tests: Recent Labs  Lab 10/09/18 0818  AST 16  ALT 12  ALKPHOS 34*  BILITOT 0.6  PROT 6.0*  ALBUMIN 2.7*    CBG: Recent Labs  Lab 10/08/18 2153 10/09/18 0730 10/09/18 1311 10/09/18  1716 10/09/18 2241  GLUCAP 149* 86 79 110* 82      Radiology Studies: Dg Chest 2 View  Result Date: 10/08/2018 CLINICAL DATA:  Lung cancer, nausea, weakness, shortness of breath EXAM: CHEST - 2 VIEW COMPARISON:  09/24/2018 FINDINGS: Lungs are clear. No pleural effusion or pneumothorax. Prior mediastinal lymphadenopathy, particularly along the right paratracheal stripe, appears improved. The heart is normal in size. Left chest power port terminates in the lower SVC. Visualized osseous structures are within normal limits. IMPRESSION: No evidence of acute cardiopulmonary disease. Electronically Signed   By: Charline Bills M.D.   On: 10/08/2018 11:24   Dg Esophagus  Result Date: 10/09/2018 CLINICAL DATA:  Sore throat and chest discomfort with  sensation of food sticking. Odynophagia. Stage III lung cancer. EXAM: ESOPHOGRAM / BARIUM SWALLOW / BARIUM TABLET STUDY TECHNIQUE: Combined double contrast and single contrast examination performed using effervescent crystals, thick barium liquid, and thin barium liquid. The patient was observed with fluoroscopy swallowing a 13 mm barium sulphate tablet. FLUOROSCOPY TIME:  Fluoroscopy Time:  2 minutes, 24 seconds Radiation Exposure Index (if provided by the fluoroscopic device): 15.2 mGy Number of Acquired Spot Images: 0 COMPARISON:  PET-CT from 08/21/2018 FINDINGS: During the pharyngeal phase of contrast, there was mild vallecular and piriform stasis. Primary peristaltic waves were intact on 4/4 swallows. Starting in the upper thoracic esophagus and extending over at about 5.5 cm length, there is a smoothly marginated narrowing of the proximal esophagus at which I could only distend the esophagus up to about 12 mm in one projection in 10 mm and another. The 13 mm barium tablet impacted in this vicinity in would not progressed despite numerous swallows. In addition, I was unable to fully distend the distal esophagus, maximal distention was about 12 mm. Because the pill did not pass from the proximal esophagus, I was unable to challenge the distal esophagus with the pill. There is atherosclerotic calcification of the aortic arch. IMPRESSION: 1. Approximately 5.5 cm smooth narrowing of the proximal esophagus. This is near the region of the patient's right paratracheal adenopathy. The narrowing could be related to radiation therapy or, less likely, mass effect from the paratracheal adenopathy on the trachea causing compression of the esophagus between the trachea and the aorta. 2. I was not able to distend the distal esophagus beyond about 12 mm, and accordingly a smooth narrowing in the distal esophagus is likewise not excluded. 3.  Aortic Atherosclerosis (ICD10-I70.0). Electronically Signed   By: Gaylyn Rong  M.D.   On: 10/09/2018 10:12     Medications:  Scheduled: . chlorhexidine  15 mL Mouth Rinse BID  . cholecalciferol  2,000 Units Oral Daily  . enoxaparin (LOVENOX) injection  30 mg Subcutaneous Q24H  . feeding supplement  1 Container Oral TID BM  . magic mouthwash w/lidocaine  15 mL Oral TID WC & HS  . mouth rinse  15 mL Mouth Rinse q12n4p  . mometasone-formoterol  2 puff Inhalation BID  . nebivolol  10 mg Oral Daily  . pantoprazole  40 mg Oral BID  . sucralfate  1 g Oral TID WC & HS   Continuous: . sodium chloride 100 mL/hr at 10/10/18 0400  . magnesium sulfate 1 - 4 g bolus IVPB     WUJ:[id number] **OR** acetaminophen, baclofen, diclofenac sodium, diphenhydrAMINE, fluticasone, hydrocortisone-pramoxine, HYDROmorphone (DILAUDID) injection, nitroGLYCERIN, ondansetron **OR** ondansetron (ZOFRAN) IV, sodium chloride flush    Assessment/Plan:  Acute kidney injury This is most likely prerenal azotemia as a result of  poor oral intake.  His dysphagia contributed to poor oral intake.  He has been on chemotherapy as well.  Patient started on IV fluids.  Renal function has been improving.  He is passing urine.  Continue IV fluids at a lower rate.  Continue to monitor closely.   Dysphagia and odynophagia Abnormal esophagram report noted.  Radiation esophagitis still on top of the differential.  Gastroenterology was consulted yesterday.  We appreciate their input.  Patient started on Magic mouthwash.  As patient was on aspirin and Plavix he cannot undergo esophageal dilatation currently.  We will have to wait 5 days off of Plavix.  Gastroenterology continues to follow.    Small cell lung cancer Dr. [name] has been flagged on epic.  Received start of cycle 2 chemotherapy on 11/13.  He has been getting radiation treatments as well which is currently on hold.  Hypokalemia and hypomagnesemia Potassium level is normal.  Magnesium level remains low.  This will be supplemented.    Normocytic  anemia Likely due to chronic disease.  Mild drop in hemoglobin is likely dilutional.  Hemoglobin remains stable.  Diabetes mellitus type 2, controlled CBGs are reasonably well controlled..  Sliding scale coverage.  HbA1c 5.7 in September  History of coronary artery disease He has had stent placements previously to left circumflex and obtuse marginal branch.  He underwent cardiac catheterization in January 2018 which showed 100% occluded OM 200% occluded left circumflex which was treated with PTCA.  He has been on aspirin Plavix statin Ranexa beta-blocker and nitrates.  Seems to be stable from a cardiac standpoint.  Cardiac status is stable.  Holding his antiplatelet agents in case he needs to undergo EGD.  History of COPD with asthma Stable.  Continue bronchodilators as needed.  History of GERD Continue Pepcid  Sleep apnea Patient does not tolerate CPAP.  DVT Prophylaxis: Lovenox    Code Status: Full code Family Communication: Discussed with the patient and his wife. Disposition Plan: Management as outlined above.    LOS: 1 day   Osvaldo Shipper  [facility] Pager [phone] 10/10/2018, 7:38 AM  If 7PM-7AM, please contact night-coverage at [email or web address], password [facility]

## 2018-10-10 NOTE — Progress Notes (Addendum)
[facility] Progress Note   Chief Complaint:   Dysphagia / odynophagia   SUBJECTIVE:   He noticed some improvement in swallowing yesterday after staring Magic Mouthwash. Wants to advance diet.    ASSESSMENT AND PLAN:   1. 52 yo male with small cell lung cancer presented with right paratracheal mass and right suprahilar mass. Undergoing chemotherapy / radiation.   2. Three to four week history of odynophagia / dysphagia / anorexia. Weight loss probably multifactorial but he is down from 250 pounds late October to 228 pounds on 10/02/18. No swallowing issues prior to starting chemo / radiation. Barium swallow shows smooth tapering of proximal esophagus, tapering of distal esophagus couldn't be excluded.  -Radiation esophagitis? Extrinsic compression? Combination of both? It is encouraging that he did have some improvement after starting Magic Mouthwash last night. He would like to continue [employer], try to advance diet and hold off on EGD for now which all sounds reasonable.  -Will advance to full liquids.  -Reassess in am for potential EGD if not continuing to improve.   3. AKI, improving    Attending physician's note   I have taken an interval history, reviewed the chart and examined the patient. I agree with the Advanced Practitioner's note, impression and recommendations.   Dysphagia, odynophagia improved on current regimen. Advance to full liquid diet. Would not advance beyond full liquids for several days. Continue to defer EGD as he is improving and we are unable to safely proceed with esophageal dilation, if necessary, until 5 days off Plavix.   Lucio Edward, MD FACG [phone]     OBJECTIVE:     Vital signs in last 24 hours: Temp:  [98.5 F (36.9 C)-99.3 F (37.4 C)] 99.3 F (37.4 C) (11/18 2240) Pulse Rate:  [64-77] 71 (11/19 0814) Resp:  [17-18] 18 (11/19 0803) BP: (139-160)/(95-105) 160/105 (11/19 0814) SpO2:  [92 %-98 %] 96 %  (11/19 0814) Last BM Date: 10/08/18 General:   Alert, well-developed male in NAD EENT:  Normal hearing, non icteric sclera, conjunctive pink.  Heart:  Regular rate and rhythm.  No lower extremity edema   Pulm: Normal respiratory effort Abdomen:  Soft, nondistended, nontender.  Normal bowel sounds, no masses felt.       Neurologic:  Alert and  oriented x4;  grossly normal neurologically. Psych:  Pleasant, cooperative.  Normal mood and affect.   Intake/Output from previous day: 11/18 0701 - 11/19 0700 In: 2211.1 [I.V.:2011.1; IV Piggyback:200] Out: 4 [Urine:3; Emesis/NG output:1] Intake/Output this shift: No intake/output data recorded.  Lab Results: Recent Labs    10/08/18 1043 10/08/18 1051 10/09/18 0818 10/10/18 0354  WBC 4.4  --  2.0* 1.8*  HGB 10.8* 10.5* 9.3* 10.0*  HCT 32.0* 31.0* 28.5* 29.8*  PLT 362  --  278 238   BMET Recent Labs    10/08/18 1043 10/08/18 1051 10/09/18 0818 10/10/18 0354  NA 140 140 141 142  K 3.3* 3.4* 3.5 3.7  CL 101 101 105 105  CO2 28  --  29 27  GLUCOSE 184* 181* 89 85  BUN 50* 43* 40* 29*  CREATININE 2.25* 2.50* 1.92* 1.60*  CALCIUM 8.7*  --  8.4* 8.3*   LFT Recent Labs    10/09/18 0818  PROT 6.0*  ALBUMIN 2.7*  AST 16  ALT 12  ALKPHOS 34*  BILITOT 0.6    Dg Chest 2 View  Result Date: 10/08/2018 CLINICAL DATA:  Lung cancer, nausea, weakness, shortness of breath  EXAM: CHEST - 2 VIEW COMPARISON:  09/24/2018 FINDINGS: Lungs are clear. No pleural effusion or pneumothorax. Prior mediastinal lymphadenopathy, particularly along the right paratracheal stripe, appears improved. The heart is normal in size. Left chest power port terminates in the lower SVC. Visualized osseous structures are within normal limits. IMPRESSION: No evidence of acute cardiopulmonary disease. Electronically Signed   By: Julian Hy M.D.   On: 10/08/2018 11:24   Dg Esophagus  Result Date: 10/09/2018 CLINICAL DATA:  Sore throat and chest discomfort  with sensation of food sticking. Odynophagia. Stage III lung cancer. EXAM: ESOPHOGRAM / BARIUM SWALLOW / BARIUM TABLET STUDY TECHNIQUE: Combined double contrast and single contrast examination performed using effervescent crystals, thick barium liquid, and thin barium liquid. The patient was observed with fluoroscopy swallowing a 13 mm barium sulphate tablet. FLUOROSCOPY TIME:  Fluoroscopy Time:  2 minutes, 24 seconds Radiation Exposure Index (if provided by the fluoroscopic device): 15.2 mGy Number of Acquired Spot Images: 0 COMPARISON:  PET-CT from 08/21/2018 FINDINGS: During the pharyngeal phase of contrast, there was mild vallecular and piriform stasis. Primary peristaltic waves were intact on 4/4 swallows. Starting in the upper thoracic esophagus and extending over at about 5.5 cm length, there is a smoothly marginated narrowing of the proximal esophagus at which I could only distend the esophagus up to about 12 mm in one projection in 10 mm and another. The 13 mm barium tablet impacted in this vicinity in would not progressed despite numerous swallows. In addition, I was unable to fully distend the distal esophagus, maximal distention was about 12 mm. Because the pill did not pass from the proximal esophagus, I was unable to challenge the distal esophagus with the pill. There is atherosclerotic calcification of the aortic arch. IMPRESSION: 1. Approximately 5.5 cm smooth narrowing of the proximal esophagus. This is near the region of the patient's right paratracheal adenopathy. The narrowing could be related to radiation therapy or, less likely, mass effect from the paratracheal adenopathy on the trachea causing compression of the esophagus between the trachea and the aorta. 2. I was not able to distend the distal esophagus beyond about 12 mm, and accordingly a smooth narrowing in the distal esophagus is likewise not excluded. 3.  Aortic Atherosclerosis (ICD10-I70.0). Electronically Signed   By: Van Clines M.D.   On: 10/09/2018 10:12      LOS: 1 day   Tye Savoy ,NP 10/10/2018, 9:07 AM

## 2018-10-11 ENCOUNTER — Encounter: Payer: Self-pay | Admitting: Gastroenterology

## 2018-10-11 ENCOUNTER — Ambulatory Visit
Admission: RE | Admit: 2018-10-11 | Discharge: 2018-10-11 | Disposition: A | Discharge status: Home / Self Care | Payer: Medicare Other | Source: Ambulatory Visit | Attending: Radiation Oncology | Admitting: Radiation Oncology

## 2018-10-11 DIAGNOSIS — J449 Chronic obstructive pulmonary disease, unspecified: Secondary | ICD-10-CM

## 2018-10-11 DIAGNOSIS — R1310 Dysphagia, unspecified: Secondary | ICD-10-CM

## 2018-10-11 DIAGNOSIS — Z9861 Coronary angioplasty status: Secondary | ICD-10-CM

## 2018-10-11 DIAGNOSIS — C3411 Malignant neoplasm of upper lobe, right bronchus or lung: Secondary | ICD-10-CM

## 2018-10-11 DIAGNOSIS — R933 Abnormal findings on diagnostic imaging of other parts of digestive tract: Secondary | ICD-10-CM

## 2018-10-11 DIAGNOSIS — I2510 Atherosclerotic heart disease of native coronary artery without angina pectoris: Secondary | ICD-10-CM

## 2018-10-11 DIAGNOSIS — N179 Acute kidney failure, unspecified: Principal | ICD-10-CM

## 2018-10-11 LAB — CBC WITH DIFFERENTIAL/PLATELET
Abs Immature Granulocytes: 0.01 10*3/uL (ref 0.00–0.07)
BASOS ABS: 0 10*3/uL (ref 0.0–0.1)
Basophils Relative: 1 %
Eosinophils Absolute: 0 10*3/uL (ref 0.0–0.5)
Eosinophils Relative: 0 %
HCT: 28.6 % — ABNORMAL LOW (ref 39.0–52.0)
HEMOGLOBIN: 9.4 g/dL — AB (ref 13.0–17.0)
IMMATURE GRANULOCYTES: 1 %
LYMPHS ABS: 0.2 10*3/uL — AB (ref 0.7–4.0)
LYMPHS PCT: 11 %
MCH: 28.4 pg (ref 26.0–34.0)
MCHC: 32.9 g/dL (ref 30.0–36.0)
MCV: 86.4 fL (ref 80.0–100.0)
Monocytes Absolute: 0 10*3/uL — ABNORMAL LOW (ref 0.1–1.0)
Monocytes Relative: 3 %
NEUTROS PCT: 84 %
NRBC: 0 % (ref 0.0–0.2)
Neutro Abs: 1.3 10*3/uL — ABNORMAL LOW (ref 1.7–7.7)
Platelets: 173 10*3/uL (ref 150–400)
RBC: 3.31 MIL/uL — ABNORMAL LOW (ref 4.22–5.81)
RDW: 11.9 % (ref 11.5–15.5)
WBC: 1.6 10*3/uL — ABNORMAL LOW (ref 4.0–10.5)

## 2018-10-11 LAB — BASIC METABOLIC PANEL
ANION GAP: 9 (ref 5–15)
BUN: 26 mg/dL — ABNORMAL HIGH (ref 6–20)
CHLORIDE: 106 mmol/L (ref 98–111)
CO2: 28 mmol/L (ref 22–32)
CREATININE: 1.38 mg/dL — AB (ref 0.61–1.24)
Calcium: 8.2 mg/dL — ABNORMAL LOW (ref 8.9–10.3)
GFR calc non Af Amer: 57 mL/min — ABNORMAL LOW (ref >60)
GLUCOSE: 91 mg/dL (ref 70–99)
Potassium: 3.6 mmol/L (ref 3.5–5.1)
Sodium: 143 mmol/L (ref 135–145)

## 2018-10-11 LAB — GLUCOSE, CAPILLARY
GLUCOSE-CAPILLARY: 99 mg/dL (ref 70–99)
Glucose-Capillary: 87 mg/dL (ref 70–99)

## 2018-10-11 LAB — MAGNESIUM: MAGNESIUM: 1.5 mg/dL — AB (ref 1.7–2.4)

## 2018-10-11 MED ORDER — MAGNESIUM SULFATE 2 GM/50ML IV SOLN
2.0000 g | Freq: Once | INTRAVENOUS | Status: AC
  Administered 2018-10-11: 2 g via INTRAVENOUS
  Filled 2018-10-11: qty 50

## 2018-10-11 MED ORDER — FLUTICASONE PROPIONATE 50 MCG/ACT NA SUSP: 2.0000 | Freq: Every day | NASAL | Status: DC | PRN

## 2018-10-11 MED ORDER — ENOXAPARIN SODIUM 60 MG/0.6ML ~~LOC~~ SOLN: 60.0000 mg | SUBCUTANEOUS | Status: DC

## 2018-10-11 MED ORDER — HYDROCORTISONE ACE-PRAMOXINE 2.5-1 % RE CREA: 1.0000 "application " | TOPICAL_CREAM | RECTAL | Status: DC | PRN

## 2018-10-11 MED ORDER — BACLOFEN 10 MG PO TABS: 10.0000 mg | ORAL_TABLET | Freq: Three times a day (TID) | ORAL | Status: DC | PRN

## 2018-10-11 MED ORDER — PANTOPRAZOLE SODIUM 40 MG PO TBEC: 40.0000 mg | DELAYED_RELEASE_TABLET | Freq: Every day | ORAL | Status: DC

## 2018-10-11 MED ORDER — HEPARIN SOD (PORK) LOCK FLUSH 100 UNIT/ML IV SOLN
500.0000 [IU] | INTRAVENOUS | Status: AC | PRN
  Administered 2018-10-11: 500 [IU]

## 2018-10-11 NOTE — Progress Notes (Signed)
[facility] Therapy Treatment Record Phone [phone]   Radiation Therapy was administered to [name] on: 10/11/2018  9:12 AM and was treatment # 22 out of a planned course of 33 treatments.  Radiation Treatment  1). Beam photons with 6-10 energy and Photons 6-10 MeV  2). Brachytherapy None and High dose None  3). Stereotactic Radiosurgery None  4). Other Radiation None     Jacques Earthly, RT (T)

## 2018-10-11 NOTE — Progress Notes (Signed)
D/c ing to home w/ wife via w/c. All d/c instructions given w/ verbal understanding.

## 2018-10-11 NOTE — Progress Notes (Addendum)
[facility] Progress Note   Chief Complaint:   Dysphagia / odynophagia    SUBJECTIVE:    Swallowing much better. In fact he ate a chicken sandwich from [employer] last night. Wants to go home   ASSESSMENT AND PLAN:   1. 52 yo male with small cell lung cancer presented with right paratracheal mass and right suprahilar mass. Undergoing chemotherapy / radiation. Got radiation today  2. Odynophagia / dysphagia / anorexia. May be radiation esophagitis but also ? Extrinsic compression of esophagus from adenopathy.  Fortunately he has made significant improvement with the magic mouthwash.  -No need for esophageal dilation since improving (tolerating solids).  -he wants to go home. [place] for discharge from GI standpoint. Advised patient to call our office he swallowing problems recur.  -would continue magic mouthwash for another 10 days.  -He reports that appetite is still suboptimal. Albumin is low and weight is down. Weight loss probably multifactorial but Remeron can sometimes help stimulate appetite.  -would continue a daily PPI at discharge    Attending Physician Note   I have taken an interval history, reviewed the chart and examined the patient. I agree with the Advanced Practitioner's note, impression and recommendations. Continues to improve. No plan for EGD, dilation at this time. Might need EGD as outpatient if symptoms do not completely resolve. Soft diet as tolerated. OK for discharge from GI standpoint. Magic mouthwash for 10 days as needed and PPI qd for now. GI follow up with Dr. Carlean Purl as needed. GI signing off.   Lucio Edward, MD FACG [phone]     OBJECTIVE:     Vital signs in last 24 hours: Temp:  [98.8 F (37.1 C)-98.9 F (37.2 C)] 98.8 F (37.1 C) (11/20 0456) Pulse Rate:  [73-80] 80 (11/20 0456) Resp:  [18-20] 20 (11/20 0456) BP: (121-160)/(83-105) 160/105 (11/20 0456) SpO2:  [90 %-97 %] 90 % (11/20 0814) Weight:  [103.5 kg] 103.5  kg (11/19 2100) Last BM Date: 10/08/18 General:   Alert, well-developed male in NAD EENT:  Normal hearing, non icteric sclera, conjunctive pink.  Heart:  Regular rate and rhythm.  No lower extremity edema   Pulm: Normal respiratory effort Abdomen:  Soft, nondistended, nontender.  Normal bowel sounds, no masses felt.       Neurologic:  Alert and  oriented x4;  grossly normal neurologically. Psych:  Pleasant, cooperative.  Normal mood and affect.   Intake/Output from previous day: 11/19 0701 - 11/20 0700 In: 2265 [P.O.:600; I.V.:1665] Out: -  Intake/Output this shift: No intake/output data recorded.  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

## 2018-10-11 NOTE — Discharge Summary (Signed)
Physician Discharge Summary  Andrew West JJO:[id number] DOB: 23-Jul-1966 DOA: 10/08/2018  PCP: [name], MD  Admit date: 10/08/2018 Discharge date: 10/11/2018  Admitted From: Home Disposition: Home  Recommendations for Outpatient Follow-up:  1. Follow up with PCP in 1week repeat BMP 2. Follow-up with GI/oncology as an outpatient 3. Follow-up in the ED if symptoms worsen or new appear   Home Health: No Equipment/Devices: None Discharge Condition: Stable CODE STATUS: Full Diet recommendation: Heart Healthy / Carb Modified/soft diet  Brief/Interim Summary: 52 year old male with history of coronary artery disease, chronic bilateral leg pain, chronic low back pain, anxiety disorder, depression, essential hypertension, diabetes mellitus type 2 who was recently diagnosed with small cell lung cancer and started on chemotherapy and radiation treatment presented with generalized weakness along with difficulty and painful swallowing.  He was found to have acute kidney injury.  He was treated with intravenous fluids, creatinine improved.  GI evaluated the patient and recommended conservative management with Magic mouthwash.  His symptoms are improving.  GI has cleared the patient for discharge.  he will be discharged home today.  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  Keep furosemide, losartan/hydrochlorothiazide, metformin on hold on discharge.  Dysphagia and odynophagia -GI has evaluated the patient.  Symptoms are improving with Magic mouthwash.  If condition does  not improve, patient might need EGD as an outpatient.  GI has cleared the patient for discharge.  Continue soft diet as tolerated.  Outpatient follow-up with GI.  Small cell lung cancer -Currently on chemotherapy and radiation.  Outpatient follow-up with Dr. [name]  Hypokalemia and hypomagnesemia -Replace  Chronic normocytic anemia -Hemoglobin stable  Diabetes mellitus type 2 controlled -Hold metformin on discharge.  Outpatient follow-up next  History of coronary artery disease and stent placement -Continue current home regimen.  Outpatient follow-up with cardiology.  Chest pain-free  COPD with asthma -Stable.  Continue home regimen  GERD -Continue Protonix once a day as per GI  Sleep apnea -Patient does not tolerate CPAP  Discharge Instructions  Discharge Instructions    Ambulatory referral to Gastroenterology   Complete by:  As directed    Dysphagia   Call MD for:  difficulty breathing, headache or visual disturbances   Complete by:  As directed    Call MD for:  extreme fatigue   Complete by:  As directed    Call MD for:  hives   Complete by:  As directed    Call MD for:  persistant dizziness or light-headedness   Complete by:  As directed    Call MD for:  persistant nausea and vomiting   Complete by:  As directed    Call MD for:  severe uncontrolled pain   Complete by:  As directed    Call MD for:  temperature >100.4   Complete by:  As directed    Diet - low sodium heart healthy   Complete by:  As directed    Diet Carb Modified   Complete by:  As directed    Increase activity slowly  Complete by:  As directed      Allergies as of 10/11/2018      Reactions   Iohexol Anaphylaxis   PT. TO BE PREMEDICATED PRIOR TO IV CONTRAST PER DR Eppie Gibson /MMS//12/15/15Desc: PT BECAME SOB AND CHEST TIGHTNESS AFTER CONTRAST INJECTION.  [name],RT-RCT., Onset Date: [id number]      Medication List    STOP taking these medications   cetirizine 10 MG tablet Commonly  known as:  ZYRTEC   furosemide 20 MG tablet Commonly known as:  LASIX   losartan-hydrochlorothiazide 100-25 MG tablet Commonly known as:  HYZAAR   metFORMIN 1000 MG tablet Commonly known as:  GLUCOPHAGE   tamsulosin 0.4 MG Caps capsule Commonly known as:  FLOMAX     TAKE these medications   ACCU-CHEK AVIVA PLUS w/Device Kit Use as dircted   albuterol 108 (90 Base) MCG/ACT inhaler Commonly known as:  PROVENTIL HFA;VENTOLIN HFA Inhale 1 puff into the lungs every 6 (six) hours as needed for wheezing or shortness of breath.   amLODipine 10 MG tablet Commonly known as:  NORVASC Take 1 tablet (10 mg total) by mouth daily.   aspirin 81 MG tablet Take 1 tablet (81 mg total) daily by mouth.   baclofen 10 MG tablet Commonly known as:  LIORESAL Take 1 tablet (10 mg total) by mouth 3 (three) times daily as needed for muscle spasms.   benzonatate 100 MG capsule Commonly known as:  TESSALON Take 1 capsule (100 mg total) by mouth every 8 (eight) hours.   clopidogrel 75 MG tablet Commonly known as:  PLAVIX Take 1 tablet (75 mg total) by mouth daily. YOU MAY RESTART 08/26/2018   diclofenac sodium 1 % Gel Commonly known as:  VOLTAREN Apply 2 g topically 4 (four) times daily.   FLUoxetine 40 MG capsule Commonly known as:  PROZAC Take 1 capsule (40 mg total) by mouth daily.   fluticasone 50 MCG/ACT nasal spray Commonly known as:  FLONASE Place 2 sprays into both nostrils daily as needed for allergies.   Fluticasone-Salmeterol 100-50 MCG/DOSE Aepb Commonly known as:  ADVAIR Inhale 1 puff into the lungs 2 (two) times daily.   glucose blood test strip Use as instructed   hydrALAZINE 25 MG tablet Commonly known as:  APRESOLINE Take 1 tablet (25 mg total) by mouth 2 (two) times daily.   HYDROcodone-acetaminophen 5-325 MG tablet Commonly known as:  NORCO/VICODIN Take 1-2 tablets by mouth every 6 (six) hours as needed for moderate pain.   hydrocortisone-pramoxine 2.5-1 %  rectal cream Commonly known as:  ANALPRAM-HC Place 1 application rectally as needed.   isosorbide mononitrate 120 MG 24 hr tablet Commonly known as:  IMDUR Take 1 tablet (120 mg total) by mouth daily.   lidocaine-prilocaine cream Commonly known as:  EMLA Apply 1 application topically as needed. Apply 1 tsp on skin over port site one hour prior to chemotherapy. Cover with plastic wrap. Do not rub in medication.   LORazepam 0.5 MG tablet Commonly known as:  ATIVAN 1 tab po q 4-6 hours prn or 1 tab po 30 minutes prior to radiation   magic mouthwash Soln Take 10 mLs by mouth 3 (three) times daily as needed for mouth pain. What changed:  Another medication with the same name was removed. Continue taking this medication, and follow the directions you see here.   mirtazapine 30 MG tablet Commonly known as:  REMERON Take 1 tablet (30 mg total) by mouth at bedtime.   Nebivolol HCl 20 MG Tabs  Take 1 tablet (20 mg total) by mouth daily.   nicotine 21 mg/24hr patch Commonly known as:  NICODERM CQ - dosed in mg/24 hours Place 1 patch (21 mg total) onto the skin daily.   nitroGLYCERIN 0.4 MG SL tablet Commonly known as:  NITROSTAT Place 1 tablet (0.4 mg total) under the tongue every 5 (five) minutes as needed for chest pain.   ondansetron 8 MG tablet Commonly known as:  ZOFRAN Take 1 tablet (8 mg total) by mouth every 8 (eight) hours as needed for nausea or vomiting.   pantoprazole 40 MG tablet Commonly known as:  PROTONIX Take 1 tablet (40 mg total) by mouth daily. What changed:  when to take this   prazosin 1 MG capsule Commonly known as:  MINIPRESS Take 1 capsule (1 mg total) by mouth at bedtime. For nightmares   prochlorperazine 10 MG tablet Commonly known as:  COMPAZINE Take 1 tablet (10 mg total) by mouth every 6 (six) hours as needed for nausea or vomiting.   ranolazine 1000 MG SR tablet Commonly known as:  RANEXA Take 1 tablet (1,000 mg total) by mouth 2 (two) times  daily.   rosuvastatin 20 MG tablet Commonly known as:  CRESTOR Take 1 tablet (20 mg total) by mouth daily.   sucralfate 1 g tablet Commonly known as:  CARAFATE Take 1 tablet by mouth 4 (four) times daily -  before meals and at bedtime.   tiotropium 18 MCG inhalation capsule Commonly known as:  SPIRIVA Place 1 capsule (18 mcg total) into inhaler and inhale daily.   vitamin B-12 500 MCG tablet Commonly known as:  CYANOCOBALAMIN Take 1 tablet (500 mcg total) by mouth daily.   Vitamin D 50 MCG (2000 UT) tablet Take 1 tablet (2,000 Units total) by mouth daily.   zolpidem 5 MG tablet Commonly known as:  AMBIEN Take 1 tablet (5 mg total) by mouth at bedtime as needed for sleep.      Follow-up Information    [name], MD. Schedule an appointment as soon as possible for a visit in 1 week(s).   Specialty:  Family Medicine Contact information: [address] [place] [address] [phone]        Chrystie Nose, MD .   Specialty:  Cardiology Contact information: [address] [place] [address] [place] [address] [phone]        Iva Boop, MD. Schedule an appointment as soon as possible for a visit in 1 week(s).   Specialty:  Gastroenterology Contact information: [address] [place] [address] [phone]        Si Gaul, MD. Schedule an appointment as soon as possible for a visit in 1 week(s).   Specialty:  Oncology Contact information: [address] [place] [address] [phone]          Allergies  Allergen Reactions  . Iohexol Anaphylaxis    PT. TO BE PREMEDICATED PRIOR TO IV CONTRAST PER DR Eppie Gibson /MMS//12/15/15Desc: PT BECAME SOB AND CHEST TIGHTNESS AFTER CONTRAST INJECTION.  [name],RT-RCT., Onset Date: [id number]     Consultations:  GI   Procedures/Studies: Dg Chest 2 View  Result Date: 10/08/2018 CLINICAL DATA:  Lung cancer, nausea, weakness, shortness of breath EXAM: CHEST  - 2 VIEW COMPARISON:  09/24/2018 FINDINGS: Lungs are clear. No pleural effusion or pneumothorax. Prior mediastinal lymphadenopathy, particularly along the right paratracheal stripe, appears improved. The heart is normal in size. Left chest power port terminates in the lower SVC. Visualized  osseous structures are within normal limits. IMPRESSION: No evidence of acute cardiopulmonary disease. Electronically Signed   By: Charline Bills M.D.   On: 10/08/2018 11:24   Dg Chest 2 View  Result Date: 09/24/2018 CLINICAL DATA:  Sore throat. Small cell right lung cancer on chemotherapy and radiation therapy. EXAM: CHEST - 2 VIEW COMPARISON:  08/08/2018 chest radiograph. FINDINGS: Normal heart size. Thickening of the right paratracheal stripe is decreased. Otherwise stable mediastinal contour. No pneumothorax. No pleural effusion. Lungs appear clear, with no acute consolidative airspace disease and no pulmonary edema. IMPRESSION: Thickening of the right paratracheal stripe is decreased, compatible with treatment effect. No acute cardiopulmonary disease. Electronically Signed   By: Delbert Phenix M.D.   On: 09/24/2018 15:59   Dg Esophagus  Result Date: 10/09/2018 CLINICAL DATA:  Sore throat and chest discomfort with sensation of food sticking. Odynophagia. Stage III lung cancer. EXAM: ESOPHOGRAM / BARIUM SWALLOW / BARIUM TABLET STUDY TECHNIQUE: Combined double contrast and single contrast examination performed using effervescent crystals, thick barium liquid, and thin barium liquid. The patient was observed with fluoroscopy swallowing a 13 mm barium sulphate tablet. FLUOROSCOPY TIME:  Fluoroscopy Time:  2 minutes, 24 seconds Radiation Exposure Index (if provided by the fluoroscopic device): 15.2 mGy Number of Acquired Spot Images: 0 COMPARISON:  PET-CT from 08/21/2018 FINDINGS: During the pharyngeal phase of contrast, there was mild vallecular and piriform stasis. Primary peristaltic waves were intact on 4/4 swallows.  Starting in the upper thoracic esophagus and extending over at about 5.5 cm length, there is a smoothly marginated narrowing of the proximal esophagus at which I could only distend the esophagus up to about 12 mm in one projection in 10 mm and another. The 13 mm barium tablet impacted in this vicinity in would not progressed despite numerous swallows. In addition, I was unable to fully distend the distal esophagus, maximal distention was about 12 mm. Because the pill did not pass from the proximal esophagus, I was unable to challenge the distal esophagus with the pill. There is atherosclerotic calcification of the aortic arch. IMPRESSION: 1. Approximately 5.5 cm smooth narrowing of the proximal esophagus. This is near the region of the patient's right paratracheal adenopathy. The narrowing could be related to radiation therapy or, less likely, mass effect from the paratracheal adenopathy on the trachea causing compression of the esophagus between the trachea and the aorta. 2. I was not able to distend the distal esophagus beyond about 12 mm, and accordingly a smooth narrowing in the distal esophagus is likewise not excluded. 3.  Aortic Atherosclerosis (ICD10-I70.0). Electronically Signed   By: Gaylyn Rong M.D.   On: 10/09/2018 10:12   Ir Imaging Guided Port Insertion  Result Date: 09/29/2018 INDICATION: 52 year old male with a history of right hilar lung cancer EXAM: IMPLANTED PORT A CATH PLACEMENT WITH ULTRASOUND AND FLUOROSCOPIC GUIDANCE MEDICATIONS: 2 g Ancef; The antibiotic was administered within an appropriate time interval prior to skin puncture. ANESTHESIA/SEDATION: Moderate (conscious) sedation was employed during this procedure. A total of Versed 4.0 mg and Fentanyl 100 mcg was administered intravenously. Moderate Sedation Time: 21 minutes. The patient's level of consciousness and vital signs were monitored continuously by radiology nursing throughout the procedure under my direct supervision.  FLUOROSCOPY TIME:  0 minutes, 42 seconds (12.3 mGy) COMPLICATIONS: None PROCEDURE: The procedure, risks, benefits, and alternatives were explained to the patient. Questions regarding the procedure were encouraged and answered. The patient understands and consents to the procedure. Ultrasound survey was performed with images stored  and sent to PACs. The left neck and chest was prepped with chlorhexidine, and draped in the usual sterile fashion using maximum barrier technique (cap and mask, sterile gown, sterile gloves, large sterile sheet, hand hygiene and cutaneous antiseptic). Antibiotic prophylaxis was provided with 2.0g Ancef administered IV one hour prior to skin incision. Local anesthesia was attained by infiltration with 1% lidocaine without epinephrine. Ultrasound demonstrated patency of the left internal jugular vein, and this was documented with an image. Under real-time ultrasound guidance, this vein was accessed with a 21 gauge micropuncture needle and image documentation was performed. A small dermatotomy was made at the access site with an 11 scalpel. A 0.018" wire was advanced into the SVC and used to estimate the length of the internal catheter. The access needle exchanged for a 68F micropuncture vascular sheath. The 0.018" wire was then removed and a 0.035" wire advanced into the IVC. An appropriate location for the subcutaneous reservoir was selected below the clavicle and an incision was made through the skin and underlying soft tissues. The subcutaneous tissues were then dissected using a combination of blunt and sharp surgical technique and a pocket was formed. A single lumen power injectable portacatheter was then tunneled through the subcutaneous tissues from the pocket to the dermatotomy and the port reservoir placed within the subcutaneous pocket. The venous access site was then serially dilated and a peel away vascular sheath placed over the wire. The wire was removed and the port catheter  advanced into position under fluoroscopic guidance. The catheter tip is positioned in the cavoatrial junction. This was documented with a spot image. The portacatheter was then tested and found to flush and aspirate well. The port was flushed with saline followed by 100 units/mL heparinized saline. The pocket was then closed in two layers using first subdermal inverted interrupted absorbable sutures followed by a running subcuticular suture. The epidermis was then sealed with Dermabond. The dermatotomy at the venous access site was also seal with Dermabond. Patient tolerated the procedure well and remained hemodynamically stable throughout. No complications encountered and no significant blood loss encountered IMPRESSION: Status post placement of left internal jugular port catheter. Catheter ready for use. Signed, Yvone Neu. Reyne Dumas, RPVI [facility] Radiology Electronically Signed   By: Gilmer Mor D.O.   On: 09/29/2018 16:36      Subjective: Patient seen and examined at bedside.  He feels better and wants to go home.  He is tolerating diet.  Discharge Exam: Vitals:   10/11/18 0456 10/11/18 0814  BP: (!) 160/105   Pulse: 80   Resp: 20   Temp: 98.8 F (37.1 C)   SpO2: 97% 90%   Vitals:   10/10/18 2001 10/10/18 2100 10/11/18 0456 10/11/18 0814  BP:  121/83 (!) 160/105   Pulse:  73 80   Resp:  18 20   Temp:  98.9 F (37.2 C) 98.8 F (37.1 C)   TempSrc:  Oral Oral   SpO2: 94% 96% 97% 90%  Weight:  103.5 kg    Height:  5\' 9"  (1.753 m)      General: Pt is alert, awake, not in acute distress Cardiovascular: rate controlled, S1/S2 + Respiratory: bilateral decreased breath sounds at bases Abdominal: Soft, NT, ND, bowel sounds + Extremities: no edema, no cyanosis    The results of significant diagnostics from this hospitalization (including imaging, microbiology, ancillary and laboratory) are listed below for reference.      Microbiology: No results found for  this or any previous visit (from the past 240 hour(s)).   Labs: BNP (last 3 results) Recent Labs    11/29/17 0117  BNP 50.4   Basic Metabolic Panel: Recent Labs  Lab 10/08/18 1000 10/08/18 1043 10/08/18 1051 10/09/18 0818 10/10/18 0354 10/11/18 0517  NA  --  140 140 141 142 143  K  --  3.3* 3.4* 3.5 3.7 3.6  CL  --  101 101 105 105 106  CO2  --  28  --  29 27 28   GLUCOSE  --  184* 181* 89 85 91  BUN  --  50* 43* 40* 29* 26*  CREATININE  --  2.25* 2.50* 1.92* 1.60* 1.38*  CALCIUM  --  8.7*  --  8.4* 8.3* 8.2*  MG 1.6*  --   --   --  1.4* 1.5*   Liver Function Tests: Recent Labs  Lab 10/09/18 0818  AST 16  ALT 12  ALKPHOS 34*  BILITOT 0.6  PROT 6.0*  ALBUMIN 2.7*   No results for input(s): LIPASE, AMYLASE in the last 168 hours. No results for input(s): AMMONIA in the last 168 hours. CBC: Recent Labs  Lab 10/08/18 1043 10/08/18 1051 10/09/18 0818 10/10/18 0354 10/11/18 0517  WBC 4.4  --  2.0* 1.8* 1.6*  NEUTROABS  --   --  1.7  --  1.3*  HGB 10.8* 10.5* 9.3* 10.0* 9.4*  HCT 32.0* 31.0* 28.5* 29.8* 28.6*  MCV 85.3  --  85.6 87.1 86.4  PLT 362  --  278 238 173   Cardiac Enzymes: No results for input(s): CKTOTAL, CKMB, CKMBINDEX, TROPONINI in the last 168 hours. BNP: Invalid input(s): POCBNP CBG: Recent Labs  Lab 10/10/18 1129 10/10/18 1709 10/10/18 2102 10/11/18 0739 10/11/18 1154  GLUCAP 94 84 111* 99 87   D-Dimer No results for input(s): DDIMER in the last 72 hours. Hgb A1c No results for input(s): HGBA1C in the last 72 hours. Lipid Profile No results for input(s): CHOL, HDL, LDLCALC, TRIG, CHOLHDL, LDLDIRECT in the last 72 hours. Thyroid function studies No results for input(s): TSH, T4TOTAL, T3FREE, THYROIDAB in the last 72 hours.  Invalid input(s): FREET3 Anemia work up No results for input(s): VITAMINB12, FOLATE, FERRITIN, TIBC, IRON, RETICCTPCT in the last 72 hours. Urinalysis    Component  Value Date/Time   COLORURINE YELLOW 10/08/2018 1309   APPEARANCEUR HAZY (A) 10/08/2018 1309   LABSPEC 1.015 10/08/2018 1309   PHURINE 5.0 10/08/2018 1309   GLUCOSEU 50 (A) 10/08/2018 1309   GLUCOSEU NEGATIVE 12/30/2008 1531   HGBUR LARGE (A) 10/08/2018 1309   BILIRUBINUR NEGATIVE 10/08/2018 1309   BILIRUBINUR small 10/10/2017 1455   KETONESUR NEGATIVE 10/08/2018 1309   PROTEINUR 100 (A) 10/08/2018 1309   UROBILINOGEN 1.0 10/10/2017 1455   UROBILINOGEN 1.0 01/23/2014 2333   NITRITE NEGATIVE 10/08/2018 1309   LEUKOCYTESUR NEGATIVE 10/08/2018 1309   Sepsis Labs Invalid input(s): PROCALCITONIN,  WBC,  LACTICIDVEN Microbiology No results found for this or any previous visit (from the past 240 hour(s)).   Time coordinating discharge: 35 minutes  SIGNED:   Glade Lloyd, MD  [facility] 10/11/2018, 12:52 PM Pager: [phone]  If 7PM-7AM, please contact night-coverage [email or web address] Password [email or web address]

## 2018-10-11 NOTE — Progress Notes (Signed)
[facility] Therapy Treatment Record Phone [phone]   Radiation Therapy was administered to Andrew West on: 10/11/2018  9:13 AM and was treatment # 23 out of a planned course of 33 treatments.  Radiation Treatment  1). Beam photons with 6-10 energy and Photons 6-10 MeV  2). Brachytherapy None and High dose None  3). Stereotactic Radiosurgery None  4). Other Radiation None     Jacques Earthly, RT (T)

## 2018-10-12 ENCOUNTER — Ambulatory Visit: Payer: Medicare Other

## 2018-10-13 ENCOUNTER — Ambulatory Visit
Admission: RE | Admit: 2018-10-13 | Discharge: 2018-10-13 | Disposition: A | Discharge status: Home / Self Care | Payer: Medicare Other | Source: Ambulatory Visit | Attending: Radiation Oncology | Admitting: Radiation Oncology

## 2018-10-13 DIAGNOSIS — Z510 Encounter for antineoplastic radiation therapy: Secondary | ICD-10-CM | POA: Diagnosis not present

## 2018-10-13 DIAGNOSIS — C3411 Malignant neoplasm of upper lobe, right bronchus or lung: Secondary | ICD-10-CM | POA: Diagnosis not present

## 2018-10-14 NOTE — Progress Notes (Signed)
Radiation Oncology         [phone] ________________________________  Name: Andrew West MRN: [id number]  Date: 08/31/2018  DOB: 24-Jan-1966  SIMULATION AND TREATMENT PLANNING NOTE  DIAGNOSIS:     ICD-10-CM   1. 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  Identity was confirmed.  All relevant records and images related to the planned course of therapy were reviewed.   Written consent to proceed with treatment was confirmed which was freely given after reviewing the details related to the planned course of therapy had been reviewed with the patient.  Then, the patient was set-up in a stable reproducible  supine position for radiation therapy.  CT images were obtained.  Surface markings were placed.    Medically necessary complex treatment device(s) for immobilization:  Vac-lock bag.   The CT images were loaded into the planning software.  Then the target and avoidance structures were contoured.  Treatment planning then occurred.  The radiation prescription was entered and confirmed.   Customized fields/ complex treatment devices will be used on a daily basis during the radiation course. I have requested : IMRT treatment planning. This is medically necessary due to the close proximity of the target volume to adjacent critical normal structures including the spinal cord, lungs, and esophagus.  The patient will undergo daily image guidance to ensure accurate localization of the target, and adequate minimize dose to the normal surrounding structures in close proximity to the target.  PLAN:  The patient will receive 60 Gy in 30 fractions initially. The patient will then receive a 6 Gy boost for a final dose of 66 Gy.  Special treatment procedure The patient will also receive concurrent chemotherapy during the treatment. The patient may therefore experience increased toxicity or  side effects and the patient will be monitored for such problems. This may require extra lab work as necessary. This therefore constitutes a special treatment procedure.   ________________________________   Radene Gunning, MD, PhD

## 2018-10-14 NOTE — Progress Notes (Signed)
Radiation Oncology         [phone] ________________________________  Name: Andrew West MRN: [id number]  Date: 08/31/2018  DOB: 01/03/1966  RESPIRATORY MOTION MANAGEMENT SIMULATION  NARRATIVE:  In order to account for effect of respiratory motion on target structures and other organs in the planning and delivery of radiotherapy, this patient underwent respiratory motion management simulation.  To accomplish this, when the patient was brought to the CT simulation planning suite, 4D respiratoy motion management CT images were obtained.  The CT images were loaded into the planning software.  Then, using a variety of tools including Cine, MIP, and standard views, the target volume and planning target volumes (PTV) were delineated.  Avoidance structures were contoured.  Treatment planning then occurred.  Dose volume histograms were generated and reviewed for each of the requested structure.  The resulting plan was carefully reviewed and approved today.   ------------------------------------------------  Radene Gunning, MD, PhD

## 2018-10-15 ENCOUNTER — Ambulatory Visit
Admission: RE | Admit: 2018-10-15 | Discharge: 2018-10-15 | Disposition: A | Discharge status: Home / Self Care | Payer: Medicare Other | Source: Ambulatory Visit | Attending: Radiation Oncology | Admitting: Radiation Oncology

## 2018-10-15 DIAGNOSIS — Z510 Encounter for antineoplastic radiation therapy: Secondary | ICD-10-CM | POA: Diagnosis not present

## 2018-10-15 DIAGNOSIS — C3411 Malignant neoplasm of upper lobe, right bronchus or lung: Secondary | ICD-10-CM | POA: Diagnosis not present

## 2018-10-16 ENCOUNTER — Telehealth: Payer: Self-pay | Admitting: Medical Oncology

## 2018-10-16 ENCOUNTER — Inpatient Hospital Stay: Payer: Medicare Other

## 2018-10-16 ENCOUNTER — Ambulatory Visit
Admission: RE | Admit: 2018-10-16 | Discharge: 2018-10-16 | Disposition: A | Discharge status: Home / Self Care | Payer: Medicare Other | Source: Ambulatory Visit | Attending: Radiation Oncology | Admitting: Radiation Oncology

## 2018-10-16 ENCOUNTER — Other Ambulatory Visit: Payer: Self-pay | Admitting: Medical Oncology

## 2018-10-16 DIAGNOSIS — Z79899 Other long term (current) drug therapy: Secondary | ICD-10-CM | POA: Diagnosis not present

## 2018-10-16 DIAGNOSIS — T451X5A Adverse effect of antineoplastic and immunosuppressive drugs, initial encounter: Principal | ICD-10-CM

## 2018-10-16 DIAGNOSIS — D701 Agranulocytosis secondary to cancer chemotherapy: Secondary | ICD-10-CM

## 2018-10-16 DIAGNOSIS — Z510 Encounter for antineoplastic radiation therapy: Secondary | ICD-10-CM | POA: Diagnosis not present

## 2018-10-16 DIAGNOSIS — C3411 Malignant neoplasm of upper lobe, right bronchus or lung: Secondary | ICD-10-CM

## 2018-10-16 DIAGNOSIS — Z5111 Encounter for antineoplastic chemotherapy: Secondary | ICD-10-CM | POA: Diagnosis not present

## 2018-10-16 DIAGNOSIS — Z5189 Encounter for other specified aftercare: Secondary | ICD-10-CM | POA: Diagnosis not present

## 2018-10-16 DIAGNOSIS — R5383 Other fatigue: Secondary | ICD-10-CM | POA: Diagnosis not present

## 2018-10-16 LAB — CMP (CANCER CENTER ONLY)
ALK PHOS: 54 U/L (ref 38–126)
ALT: 11 U/L (ref 0–44)
ANION GAP: 12 (ref 5–15)
AST: 12 U/L — ABNORMAL LOW (ref 15–41)
Albumin: 3.3 g/dL — ABNORMAL LOW (ref 3.5–5.0)
BILIRUBIN TOTAL: 0.4 mg/dL (ref 0.3–1.2)
BUN: 20 mg/dL (ref 6–20)
CALCIUM: 9.8 mg/dL (ref 8.9–10.3)
CO2: 29 mmol/L (ref 22–32)
Chloride: 104 mmol/L (ref 98–111)
Creatinine: 1.98 mg/dL — ABNORMAL HIGH (ref 0.61–1.24)
GFR, EST NON AFRICAN AMERICAN: 37 mL/min — AB (ref >60)
GFR, Est AFR Am: 43 mL/min — ABNORMAL LOW (ref >60)
GLUCOSE: 89 mg/dL (ref 70–99)
POTASSIUM: 3.7 mmol/L (ref 3.5–5.1)
Sodium: 145 mmol/L (ref 135–145)
TOTAL PROTEIN: 7 g/dL (ref 6.5–8.1)

## 2018-10-16 LAB — CBC WITH DIFFERENTIAL (CANCER CENTER ONLY)
Abs Immature Granulocytes: 0 10*3/uL (ref 0.00–0.07)
BASOS ABS: 0 10*3/uL (ref 0.0–0.1)
Basophils Relative: 1 %
EOS PCT: 1 %
Eosinophils Absolute: 0 10*3/uL (ref 0.0–0.5)
HEMATOCRIT: 29.5 % — AB (ref 39.0–52.0)
HEMOGLOBIN: 9.8 g/dL — AB (ref 13.0–17.0)
Immature Granulocytes: 0 %
LYMPHS ABS: 0.2 10*3/uL — AB (ref 0.7–4.0)
LYMPHS PCT: 24 %
MCH: 28 pg (ref 26.0–34.0)
MCHC: 33.2 g/dL (ref 30.0–36.0)
MCV: 84.3 fL (ref 80.0–100.0)
MONO ABS: 0.2 10*3/uL (ref 0.1–1.0)
MONOS PCT: 28 %
NRBC: 0 % (ref 0.0–0.2)
Neutro Abs: 0.4 10*3/uL — CL (ref 1.7–7.7)
Neutrophils Relative %: 46 %
Platelet Count: 63 10*3/uL — ABNORMAL LOW (ref 150–400)
RBC: 3.5 MIL/uL — ABNORMAL LOW (ref 4.22–5.81)
RDW: 11.9 % (ref 11.5–15.5)
WBC Count: 0.8 10*3/uL — CL (ref 4.0–10.5)

## 2018-10-16 LAB — MAGNESIUM: MAGNESIUM: 1.6 mg/dL — AB (ref 1.7–2.4)

## 2018-10-16 MED ORDER — TBO-FILGRASTIM 480 MCG/0.8ML ~~LOC~~ SOSY: 480.0000 ug | PREFILLED_SYRINGE | Freq: Once | SUBCUTANEOUS | Status: DC

## 2018-10-16 MED ORDER — TBO-FILGRASTIM 480 MCG/0.8ML ~~LOC~~ SOSY
PREFILLED_SYRINGE | SUBCUTANEOUS | Status: AC
  Filled 2018-10-16: qty 0.8

## 2018-10-16 MED ORDER — FILGRASTIM 300 MCG/0.5ML IJ SOSY: 300.0000 ug | PREFILLED_SYRINGE | Freq: Once | INTRAMUSCULAR | Status: DC

## 2018-10-16 MED ORDER — TBO-FILGRASTIM 300 MCG/0.5ML ~~LOC~~ SOSY: 300.0000 ug | PREFILLED_SYRINGE | Freq: Once | SUBCUTANEOUS | Status: DC

## 2018-10-16 MED ORDER — TBO-FILGRASTIM 480 MCG/0.8ML ~~LOC~~ SOSY
480.0000 ug | PREFILLED_SYRINGE | Freq: Once | SUBCUTANEOUS | Status: AC
  Administered 2018-10-16: 480 ug via SUBCUTANEOUS

## 2018-10-16 NOTE — Progress Notes (Signed)
Confirmed Granix dose with RN. Granix dose should be 480mcg.   Hardie Pulley, PharmD, BCPS, BCOP

## 2018-10-16 NOTE — Patient Instructions (Addendum)
Tbo-Filgrastim injection What is this medicine? TBO-FILGRASTIM (T B O fil GRA stim) is a granulocyte colony-stimulating factor that stimulates the growth of neutrophils, a type of white blood cell important in the body's fight against infection. It is used to reduce the incidence of fever and infection in patients with certain types of cancer who are receiving chemotherapy that affects the bone marrow. This medicine may be used for other purposes; ask your health care provider or pharmacist if you have questions. COMMON BRAND NAME(S): Granix What should I tell my health care provider before I take this medicine? They need to know if you have any of these conditions: -bone scan or tests planned -kidney disease -sickle cell anemia -an unusual or allergic reaction to tbo-filgrastim, filgrastim, pegfilgrastim, other medicines, foods, dyes, or preservatives -pregnant or trying to get pregnant -breast-feeding How should I use this medicine? This medicine is for injection under the skin. If you get this medicine at home, you will be taught how to prepare and give this medicine. Refer to the Instructions for Use that come with your medication packaging. Use exactly as directed. Take your medicine at regular intervals. Do not take your medicine more often than directed. It is important that you put your used needles and syringes in a special sharps container. Do not put them in a trash can. If you do not have a sharps container, call your pharmacist or healthcare provider to get one. Talk to your pediatrician regarding the use of this medicine in children. Special care may be needed. Overdosage: If you think you have taken too much of this medicine contact a poison control center or emergency room at once. NOTE: This medicine is only for you. Do not share this medicine with others. What if I miss a dose? It is important not to miss your dose. Call your doctor or health care professional if you miss a  dose. What may interact with this medicine? This medicine may interact with the following medications: -medicines that may cause a release of neutrophils, such as lithium This list may not describe all possible interactions. Give your health care provider a list of all the medicines, herbs, non-prescription drugs, or dietary supplements you use. Also tell them if you smoke, drink alcohol, or use illegal drugs. Some items may interact with your medicine. What should I watch for while using this medicine? You may need blood work done while you are taking this medicine. What side effects may I notice from receiving this medicine? Side effects that you should report to your doctor or health care professional as soon as possible: -allergic reactions like skin rash, itching or hives, swelling of the face, lips, or tongue -blood in the urine -dark urine -dizziness -fast heartbeat -feeling faint -shortness of breath or breathing problems -signs and symptoms of infection like fever or chills; cough; or sore throat -signs and symptoms of kidney injury like trouble passing urine or change in the amount of urine -stomach or side pain, or pain at the shoulder -sweating -swelling of the legs, ankles, or abdomen -tiredness Side effects that usually do not require medical attention (report to your doctor or health care professional if they continue or are bothersome): -bone pain -headache -muscle pain -vomiting This list may not describe all possible side effects. Call your doctor for medical advice about side effects. You may report side effects to FDA at 1-800-FDA-1088. Where should I keep my medicine? Keep out of the reach of children. Store in a refrigerator between   2 and 8 degrees C (36 and 46 degrees F). Keep in carton to protect from light. Throw away this medicine if it is left out of the refrigerator for more than 5 consecutive days. Throw away any unused medicine after the expiration  date. NOTE: This sheet is a summary. It may not cover all possible information. If you have questions about this medicine, talk to your doctor, pharmacist, or health care provider.  2018 [employer] Standard (2015-12-29 19:07:04)  

## 2018-10-16 NOTE — Telephone Encounter (Signed)
Left message for pt to come to scheduling  After xrt for appts for injections

## 2018-10-16 NOTE — Addendum Note (Signed)
Addended by: Levada Schilling on: 10/16/2018 11:18 AM   Modules accepted: Orders

## 2018-10-17 ENCOUNTER — Ambulatory Visit
Admission: RE | Admit: 2018-10-17 | Discharge: 2018-10-17 | Disposition: A | Discharge status: Home / Self Care | Payer: Medicare Other | Source: Ambulatory Visit | Attending: Radiation Oncology | Admitting: Radiation Oncology

## 2018-10-17 ENCOUNTER — Ambulatory Visit: Payer: Medicare Other | Admitting: Gastroenterology

## 2018-10-17 ENCOUNTER — Other Ambulatory Visit: Payer: Self-pay | Admitting: Urology

## 2018-10-17 ENCOUNTER — Inpatient Hospital Stay: Payer: Medicare Other

## 2018-10-17 ENCOUNTER — Telehealth: Payer: Self-pay | Admitting: Physician Assistant

## 2018-10-17 DIAGNOSIS — Z510 Encounter for antineoplastic radiation therapy: Secondary | ICD-10-CM | POA: Diagnosis not present

## 2018-10-17 DIAGNOSIS — C3411 Malignant neoplasm of upper lobe, right bronchus or lung: Secondary | ICD-10-CM | POA: Diagnosis not present

## 2018-10-17 DIAGNOSIS — Z79899 Other long term (current) drug therapy: Secondary | ICD-10-CM | POA: Diagnosis not present

## 2018-10-17 DIAGNOSIS — T451X5A Adverse effect of antineoplastic and immunosuppressive drugs, initial encounter: Principal | ICD-10-CM

## 2018-10-17 DIAGNOSIS — Z5189 Encounter for other specified aftercare: Secondary | ICD-10-CM | POA: Diagnosis not present

## 2018-10-17 DIAGNOSIS — D701 Agranulocytosis secondary to cancer chemotherapy: Secondary | ICD-10-CM

## 2018-10-17 DIAGNOSIS — R5383 Other fatigue: Secondary | ICD-10-CM | POA: Diagnosis not present

## 2018-10-17 DIAGNOSIS — Z5111 Encounter for antineoplastic chemotherapy: Secondary | ICD-10-CM | POA: Diagnosis not present

## 2018-10-17 MED ORDER — TBO-FILGRASTIM 480 MCG/0.8ML ~~LOC~~ SOSY
PREFILLED_SYRINGE | SUBCUTANEOUS | Status: AC
  Filled 2018-10-17: qty 0.8

## 2018-10-17 MED ORDER — HYDROCODONE-ACETAMINOPHEN 5-325 MG PO TABS: 1.0000 | ORAL_TABLET | Freq: Four times a day (QID) | ORAL | 0 refills | Status: DC | PRN

## 2018-10-17 MED ORDER — TBO-FILGRASTIM 480 MCG/0.8ML ~~LOC~~ SOSY
480.0000 ug | PREFILLED_SYRINGE | Freq: Once | SUBCUTANEOUS | Status: AC
  Administered 2018-10-17: 480 ug via SUBCUTANEOUS

## 2018-10-17 MED FILL — HYDROCODON-APAP 5-325: 5-325 | 15 days supply | Qty: 120 | Fill #0

## 2018-10-17 NOTE — Telephone Encounter (Signed)
New Message   Patient calling the office for samples of medication:   1.  What medication and dosage are you requesting samples for? bystolic 20mg        2.  Are you currently out of this medication? yes

## 2018-10-17 NOTE — Patient Instructions (Signed)
Tbo-Filgrastim injection What is this medicine? TBO-FILGRASTIM (T B O fil GRA stim) is a granulocyte colony-stimulating factor that stimulates the growth of neutrophils, a type of white blood cell important in the body's fight against infection. It is used to reduce the incidence of fever and infection in patients with certain types of cancer who are receiving chemotherapy that affects the bone marrow. This medicine may be used for other purposes; ask your health care provider or pharmacist if you have questions. COMMON BRAND NAME(S): Granix What should I tell my health care provider before I take this medicine? They need to know if you have any of these conditions: -bone scan or tests planned -kidney disease -sickle cell anemia -an unusual or allergic reaction to tbo-filgrastim, filgrastim, pegfilgrastim, other medicines, foods, dyes, or preservatives -pregnant or trying to get pregnant -breast-feeding How should I use this medicine? This medicine is for injection under the skin. If you get this medicine at home, you will be taught how to prepare and give this medicine. Refer to the Instructions for Use that come with your medication packaging. Use exactly as directed. Take your medicine at regular intervals. Do not take your medicine more often than directed. It is important that you put your used needles and syringes in a special sharps container. Do not put them in a trash can. If you do not have a sharps container, call your pharmacist or healthcare provider to get one. Talk to your pediatrician regarding the use of this medicine in children. Special care may be needed. Overdosage: If you think you have taken too much of this medicine contact a poison control center or emergency room at once. NOTE: This medicine is only for you. Do not share this medicine with others. What if I miss a dose? It is important not to miss your dose. Call your doctor or health care professional if you miss a  dose. What may interact with this medicine? This medicine may interact with the following medications: -medicines that may cause a release of neutrophils, such as lithium This list may not describe all possible interactions. Give your health care provider a list of all the medicines, herbs, non-prescription drugs, or dietary supplements you use. Also tell them if you smoke, drink alcohol, or use illegal drugs. Some items may interact with your medicine. What should I watch for while using this medicine? You may need blood work done while you are taking this medicine. What side effects may I notice from receiving this medicine? Side effects that you should report to your doctor or health care professional as soon as possible: -allergic reactions like skin rash, itching or hives, swelling of the face, lips, or tongue -blood in the urine -dark urine -dizziness -fast heartbeat -feeling faint -shortness of breath or breathing problems -signs and symptoms of infection like fever or chills; cough; or sore throat -signs and symptoms of kidney injury like trouble passing urine or change in the amount of urine -stomach or side pain, or pain at the shoulder -sweating -swelling of the legs, ankles, or abdomen -tiredness Side effects that usually do not require medical attention (report to your doctor or health care professional if they continue or are bothersome): -bone pain -headache -muscle pain -vomiting This list may not describe all possible side effects. Call your doctor for medical advice about side effects. You may report side effects to FDA at 1-800-FDA-1088. Where should I keep my medicine? Keep out of the reach of children. Store in a refrigerator between   2 and 8 degrees C (36 and 46 degrees F). Keep in carton to protect from light. Throw away this medicine if it is left out of the refrigerator for more than 5 consecutive days. Throw away any unused medicine after the expiration  date. NOTE: This sheet is a summary. It may not cover all possible information. If you have questions about this medicine, talk to your doctor, pharmacist, or health care provider.  2018 [employer] Standard (2015-12-29 19:07:04)  

## 2018-10-17 NOTE — Telephone Encounter (Signed)
Medication samples have been provided to the patient.  Drug name: Bystolic 20 mg  Qty: 28   LOT: [id number]  Exp.Date: 05/21  Samples left at front desk for patient pick-up. Patient notified.

## 2018-10-18 ENCOUNTER — Ambulatory Visit
Admission: RE | Admit: 2018-10-18 | Discharge: 2018-10-18 | Disposition: A | Discharge status: Home / Self Care | Payer: Medicare Other | Source: Ambulatory Visit | Attending: Radiation Oncology | Admitting: Radiation Oncology

## 2018-10-18 ENCOUNTER — Ambulatory Visit: Payer: Medicare Other

## 2018-10-18 ENCOUNTER — Other Ambulatory Visit: Payer: Self-pay | Admitting: Radiation Oncology

## 2018-10-18 ENCOUNTER — Inpatient Hospital Stay: Payer: Medicaid Other | Admitting: Family Medicine

## 2018-10-18 DIAGNOSIS — Z510 Encounter for antineoplastic radiation therapy: Secondary | ICD-10-CM | POA: Diagnosis not present

## 2018-10-18 DIAGNOSIS — C3411 Malignant neoplasm of upper lobe, right bronchus or lung: Secondary | ICD-10-CM | POA: Diagnosis not present

## 2018-10-18 MED ORDER — DRONABINOL 5 MG PO CAPS: 5.0000 mg | ORAL_CAPSULE | Freq: Two times a day (BID) | ORAL | 0 refills | Status: DC

## 2018-10-18 MED FILL — DRONABINOL 5 MG CAP: 5 | 30 days supply | Qty: 60 | Fill #0

## 2018-10-18 MED FILL — ZOLPIDEM TARTRATE 5 MG TAB: 5 | 30 days supply | Qty: 30 | Fill #1

## 2018-10-19 ENCOUNTER — Ambulatory Visit: Payer: Medicare Other

## 2018-10-20 ENCOUNTER — Ambulatory Visit: Payer: Medicare Other

## 2018-10-20 ENCOUNTER — Ambulatory Visit (HOSPITAL_COMMUNITY)
Admission: RE | Admit: 2018-10-20 | Discharge: 2018-10-20 | Disposition: A | Discharge status: Home / Self Care | Payer: Medicare Other | Source: Ambulatory Visit | Attending: Internal Medicine | Admitting: Internal Medicine

## 2018-10-20 DIAGNOSIS — C3490 Malignant neoplasm of unspecified part of unspecified bronchus or lung: Secondary | ICD-10-CM | POA: Diagnosis not present

## 2018-10-20 DIAGNOSIS — C3411 Malignant neoplasm of upper lobe, right bronchus or lung: Secondary | ICD-10-CM | POA: Insufficient documentation

## 2018-10-22 ENCOUNTER — Other Ambulatory Visit: Payer: Self-pay

## 2018-10-22 ENCOUNTER — Emergency Department (HOSPITAL_COMMUNITY)
Admission: EM | Admit: 2018-10-22 | Discharge: 2018-10-22 | Disposition: A | Discharge status: Home / Self Care | Payer: Medicare Other | Attending: Emergency Medicine | Admitting: Emergency Medicine

## 2018-10-22 ENCOUNTER — Encounter (HOSPITAL_COMMUNITY): Payer: Self-pay

## 2018-10-22 ENCOUNTER — Emergency Department (HOSPITAL_COMMUNITY): Payer: Medicare Other

## 2018-10-22 DIAGNOSIS — E119 Type 2 diabetes mellitus without complications: Secondary | ICD-10-CM | POA: Diagnosis not present

## 2018-10-22 DIAGNOSIS — C3411 Malignant neoplasm of upper lobe, right bronchus or lung: Secondary | ICD-10-CM | POA: Diagnosis not present

## 2018-10-22 DIAGNOSIS — J449 Chronic obstructive pulmonary disease, unspecified: Secondary | ICD-10-CM | POA: Insufficient documentation

## 2018-10-22 DIAGNOSIS — Z923 Personal history of irradiation: Secondary | ICD-10-CM | POA: Insufficient documentation

## 2018-10-22 DIAGNOSIS — R531 Weakness: Secondary | ICD-10-CM | POA: Diagnosis present

## 2018-10-22 DIAGNOSIS — I2510 Atherosclerotic heart disease of native coronary artery without angina pectoris: Secondary | ICD-10-CM | POA: Diagnosis not present

## 2018-10-22 DIAGNOSIS — Z79899 Other long term (current) drug therapy: Secondary | ICD-10-CM | POA: Insufficient documentation

## 2018-10-22 DIAGNOSIS — R630 Anorexia: Secondary | ICD-10-CM | POA: Diagnosis not present

## 2018-10-22 DIAGNOSIS — Z87891 Personal history of nicotine dependence: Secondary | ICD-10-CM | POA: Diagnosis not present

## 2018-10-22 DIAGNOSIS — R530 Neoplastic (malignant) related fatigue: Secondary | ICD-10-CM | POA: Diagnosis not present

## 2018-10-22 DIAGNOSIS — Z9221 Personal history of antineoplastic chemotherapy: Secondary | ICD-10-CM | POA: Insufficient documentation

## 2018-10-22 DIAGNOSIS — I10 Essential (primary) hypertension: Secondary | ICD-10-CM | POA: Diagnosis not present

## 2018-10-22 DIAGNOSIS — R05 Cough: Secondary | ICD-10-CM | POA: Diagnosis not present

## 2018-10-22 LAB — COMPREHENSIVE METABOLIC PANEL
ALK PHOS: 55 U/L (ref 38–126)
ALT: 12 U/L (ref 0–44)
AST: 15 U/L (ref 15–41)
Albumin: 3.3 g/dL — ABNORMAL LOW (ref 3.5–5.0)
Anion gap: 12 (ref 5–15)
BUN: 20 mg/dL (ref 6–20)
CALCIUM: 8.9 mg/dL (ref 8.9–10.3)
CHLORIDE: 100 mmol/L (ref 98–111)
CO2: 27 mmol/L (ref 22–32)
CREATININE: 1.78 mg/dL — AB (ref 0.61–1.24)
GFR calc Af Amer: 50 mL/min — ABNORMAL LOW (ref >60)
GFR calc non Af Amer: 43 mL/min — ABNORMAL LOW (ref >60)
Glucose, Bld: 78 mg/dL (ref 70–99)
Potassium: 3.3 mmol/L — ABNORMAL LOW (ref 3.5–5.1)
Sodium: 139 mmol/L (ref 135–145)
Total Bilirubin: 0.6 mg/dL (ref 0.3–1.2)
Total Protein: 6.9 g/dL (ref 6.5–8.1)

## 2018-10-22 LAB — CBC WITH DIFFERENTIAL/PLATELET
ABS IMMATURE GRANULOCYTES: 0.73 10*3/uL — AB (ref 0.00–0.07)
Basophils Absolute: 0.1 10*3/uL (ref 0.0–0.1)
Basophils Relative: 1 %
Eosinophils Absolute: 0.1 10*3/uL (ref 0.0–0.5)
Eosinophils Relative: 1 %
HEMATOCRIT: 30.5 % — AB (ref 39.0–52.0)
HEMOGLOBIN: 9.8 g/dL — AB (ref 13.0–17.0)
IMMATURE GRANULOCYTES: 11 %
LYMPHS ABS: 0.5 10*3/uL — AB (ref 0.7–4.0)
LYMPHS PCT: 7 %
MCH: 27.3 pg (ref 26.0–34.0)
MCHC: 32.1 g/dL (ref 30.0–36.0)
MCV: 85 fL (ref 80.0–100.0)
MONO ABS: 1.6 10*3/uL — AB (ref 0.1–1.0)
MONOS PCT: 23 %
NEUTROS ABS: 4 10*3/uL (ref 1.7–7.7)
Neutrophils Relative %: 57 %
Platelets: 205 10*3/uL (ref 150–400)
RBC: 3.59 MIL/uL — ABNORMAL LOW (ref 4.22–5.81)
RDW: 12.6 % (ref 11.5–15.5)
WBC: 6.9 10*3/uL (ref 4.0–10.5)
nRBC: 0.3 % — ABNORMAL HIGH (ref 0.0–0.2)

## 2018-10-22 MED ORDER — PROMETHAZINE HCL 25 MG PO TABS: 25.0000 mg | ORAL_TABLET | Freq: Four times a day (QID) | ORAL | 0 refills | Status: DC | PRN

## 2018-10-22 MED ORDER — MAGNESIUM OXIDE 400 (241.3 MG) MG PO TABS
800.0000 mg | ORAL_TABLET | Freq: Once | ORAL | Status: AC
  Administered 2018-10-22: 800 mg via ORAL
  Filled 2018-10-22: qty 2

## 2018-10-22 MED ORDER — ONDANSETRON HCL 4 MG/2ML IJ SOLN
4.0000 mg | Freq: Once | INTRAMUSCULAR | Status: AC
  Administered 2018-10-22: 4 mg via INTRAVENOUS
  Filled 2018-10-22: qty 2

## 2018-10-22 MED ORDER — POTASSIUM CHLORIDE CRYS ER 20 MEQ PO TBCR
40.0000 meq | EXTENDED_RELEASE_TABLET | Freq: Once | ORAL | Status: AC
  Administered 2018-10-22: 40 meq via ORAL
  Filled 2018-10-22: qty 2

## 2018-10-22 MED ORDER — SODIUM CHLORIDE 0.9 % IV BOLUS
1000.0000 mL | Freq: Once | INTRAVENOUS | Status: AC
  Administered 2018-10-22: 1000 mL via INTRAVENOUS

## 2018-10-22 MED ORDER — ALUM & MAG HYDROXIDE-SIMETH 200-200-20 MG/5ML PO SUSP
15.0000 mL | Freq: Once | ORAL | Status: AC
  Administered 2018-10-22: 15 mL via ORAL
  Filled 2018-10-22: qty 30

## 2018-10-22 MED ORDER — HEPARIN SOD (PORK) LOCK FLUSH 100 UNIT/ML IV SOLN
500.0000 [IU] | Freq: Once | INTRAVENOUS | Status: AC
  Administered 2018-10-22: 500 [IU]
  Filled 2018-10-22: qty 5

## 2018-10-22 NOTE — ED Triage Notes (Addendum)
Pt reports generalized body aches for the last 2 days and states that he coughed up some bloody sputum today. He endorses loss of appetite as well. Denies chest pain or SOB. A&Ox4. Last radiation was Wednesday, and chemo was 2 weeks ago. Small cell lung cancer.

## 2018-10-22 NOTE — ED Notes (Signed)
Patient transported to X-ray 

## 2018-10-22 NOTE — ED Provider Notes (Signed)
[facility] DEPT Provider Note   CSN: [id number] Arrival date & time: 10/22/18  2028     History   Chief Complaint Chief Complaint  Patient presents with  . Hemoptysis    cancer pt  . Generalized Body Aches    HPI Andrew West is a 52 y.o. male.  52 yo M with a chief complaint of weakness.  Going on for the past few days.  Per the family has been having trouble eating and drinking.  States he just does not feel like eating or drinking when he tries it makes him sick to his stomach.  He is on Marinol and that has not improved his symptoms.  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  Patient has Zofran and Compazine at home but that has not improved his nausea.  He denies vomiting denies diarrhea.  Had one episode of trace blood in his sputum.  This is resolved.  Denies shortness of breath denies fever.  The history is provided by the patient.  Illness  This is a new problem. The current episode started 2 days ago. The problem occurs constantly. The problem has been gradually worsening. Pertinent negatives include no chest pain, no abdominal pain, no headaches and no shortness of breath. Nothing aggravates the symptoms. Nothing relieves the symptoms. He has tried nothing for the symptoms. The treatment provided no relief.    Past Medical History:  Diagnosis Date  . Anxiety   . CAD (coronary artery disease)    a. s/p multiple PCIs with last cath 11/2016 with severe multivessel CAD, s/p PCTA to LCx but unable to pass stent  . Chronic leg pain    bilateral  . Chronic lower back pain   . COPD (chronic obstructive pulmonary disease) ([place])   . Depression   . GERD (gastroesophageal reflux disease)    Takes Dexilant  . HLD (hyperlipidemia)   . Hypertension   . Rhabdomyolysis    h/o, r/t statins  . Sleep apnea    "can't tolerate mask" (12/16/2016)  . Type II diabetes mellitus [facility])     Patient Active  Problem List   Diagnosis Date Noted  . Odynophagia   . AKI (acute kidney injury) ([place]) 10/08/2018  . Dysphagia 10/08/2018  . Hypokalemia 10/08/2018  . Hypomagnesemia 10/08/2018  . Type 2 diabetes mellitus ([place]) 10/08/2018  . Anemia 10/08/2018  . Sleep apnea 10/08/2018  . 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 Goals of care, counseling/discussion 08/31/2018  . Encounter for antineoplastic chemotherapy 08/31/2018  . Encounter for smoking cessation counseling 08/31/2018  . Malignant neoplasm of bronchus of right upper lobe ([place]) 08/29/2018  . Mediastinal mass 08/17/2018  . Vitamin D deficiency 08/04/2018  . Diabetic peripheral neuropathy ([place]) 06/30/2018  . Substernal chest pain 02/25/2018  . Benign prostatic hyperplasia with urinary hesitancy 11/17/2017  . Preoperative cardiovascular examination 07/12/2017  . Refractory angina ([place]) 05/24/2017  . Complex regional pain syndrome type I 03/24/2017  . Progressive angina ([place]) -Class III 12/16/2016  . Anxiety 06/28/2016  . Diastolic dysfunction-grade 2 with EF 60-65% Oct 2016 03/22/2016  . Hypertension 10/03/2015  . CAD S/P multiple PCI's 10/03/2015  . Pulmonary nodule 06/24/2015  . Cough 06/24/2015  . COPD with asthma ([place]) 06/24/2015  . Reactive airway disease 06/04/2015  . DOE (dyspnea on exertion) 04/15/2015  . Lumbar disc herniation with radiculopathy 03/31/2012  . Presence of stent in left circumflex coronary artery 10/18/2011  Class: History of  . TOBACCO ABUSE 02/27/2009  . ABDOMINAL PAIN, LEFT LOWER QUADRANT 12/30/2008  . ABDOMINAL PAIN, EPIGASTRIC 12/05/2008  . Anxiety and depression 09/05/2008  . Hereditary and idiopathic peripheral neuropathy 09/05/2008  . GERD 09/05/2008  . Cervical disc disorder with radiculopathy of cervical region 08/19/2008  . HLD (hyperlipidemia) 04/25/2008    Class: Diagnosis of  . ALLERGIC RHINITIS 04/25/2008  . Backache 04/25/2008  . Chest pain, unspecified  04/25/2008  . COLONIC POLYPS, HX OF 04/25/2008  . Obesity 04/18/2008    Class: Diagnosis of  . ANAL FISSURE, HX OF 04/18/2008  . Diabetes mellitus ([place]) 01/30/2008    Class: History of  . RECTAL BLEEDING 01/30/2008    Past Surgical History:  Procedure Laterality Date  . BACK SURGERY    . BRONCHIAL BIOPSY  08/25/2018   Procedure: BRONCHIAL BIOPSIES;  Surgeon: Garner Nash, DO;  Location: [facility];  Service: Cardiopulmonary;;  . CARDIAC CATHETERIZATION N/A 09/25/2015   Procedure: Left Heart Cath and Coronary Angiography;  Surgeon: Leonie Man, MD;  Location: [facility] CV LAB;  Service: Cardiovascular;  Laterality: N/A;  . CARDIAC CATHETERIZATION N/A 12/16/2016   Procedure: Left Heart Cath and Coronary Angiography;  Surgeon: Leonie Man, MD;  Location: [facility] CV LAB;  Service: Cardiovascular;  Laterality: N/A;  . CARDIAC CATHETERIZATION N/A 12/16/2016   Procedure: Coronary Balloon Angioplasty;  Surgeon: Leonie Man, MD;  Location: [facility] CV LAB;  Service: Cardiovascular;  Laterality: N/A;  . COLONOSCOPY W/ POLYPECTOMY    . CORONARY ANGIOPLASTY  09/25/2015   mid cir & om  . CORONARY ANGIOPLASTY WITH STENT PLACEMENT  10/09/2001   PTCA & stenting of mid AV circumflex; 2.5x57m Pixel stent  . CORONARY ANGIOPLASTY WITH STENT PLACEMENT  12/13/2001   PCI with stent to mid L circumflex, 95% stenosis to 0% residual  . CORONARY ANGIOPLASTY WITH STENT PLACEMENT  10/10/2003   PCI to mid AV circumflex; LAD 30% disease; RCA 100% occluded prox.  . CORONARY ANGIOPLASTY WITH STENT PLACEMENT  09/01/2011   PCI with stenting with bare metal stent to mid AV groove circumflex and PDA  . CORONARY ANGIOPLASTY WITH STENT PLACEMENT  10/17/2011   cutting balloon angioplasty of ostial lateral OM1 branch and bifurcation AV groove circumflex OM junction; stenosis reduced to 0%  . ENDOBRONCHIAL ULTRASOUND Bilateral 08/25/2018   Procedure: ENDOBRONCHIAL ULTRASOUND;  Surgeon: IGarner Nash DO;  Location: [facility];  Service: Cardiopulmonary;  Laterality: Bilateral;  . EXCISIONAL HEMORRHOIDECTOMY    . FINE NEEDLE ASPIRATION  08/25/2018   Procedure: FINE NEEDLE ASPIRATION;  Surgeon: IGarner Nash DO;  Location: [facility];  Service: Cardiopulmonary;;  . FLEXIBLE BRONCHOSCOPY  08/25/2018   Procedure: FLEXIBLE BRONCHOSCOPY;  Surgeon: IGarner Nash DO;  Location: [facility];  Service: Cardiopulmonary;;  . IR IMAGING GUIDED PORT INSERTION  09/29/2018  . LEFT HEART CATHETERIZATION WITH CORONARY ANGIOGRAM N/A 10/18/2011   Procedure: LEFT HEART CATHETERIZATION WITH CORONARY ANGIOGRAM;  Surgeon: DLeonie Man MD;  [facility];  Service: Cardiovascular;  Laterality: N/A;  . LUMBAR LAMINECTOMY/DECOMPRESSION MICRODISCECTOMY  03/31/2012   Procedure: LUMBAR LAMINECTOMY/DECOMPRESSION MICRODISCECTOMY 1 LEVEL;  Surgeon: HCharlie Pitter MD;  [facility];  Service: Neurosurgery;  Laterality: Left;  . TRANSTHORACIC ECHOCARDIOGRAM  07/28/2011   EF 55-65%; LVH, grade 1 diastolic dysfunction;         Home Medications    Prior to Admission medications   Medication Sig Start Date End Date Taking? Authorizing Provider  albuterol (PROAIR HFA) 108 (90 Base) MCG/ACT inhaler Inhale 1 puff into the lungs every 6 (six) hours as needed for wheezing or shortness of breath. 08/29/18  Yes [name], MD  amLODipine (NORVASC) 10 MG tablet Take 1 tablet (10 mg total) by mouth daily. 04/20/18  Yes [name], MD  aspirin 81 MG tablet Take 1 tablet (81 mg total) daily by mouth. 10/10/17  Yes Newlin, Enobong, MD  baclofen (LIORESAL) 10 MG tablet Take 1 tablet (10 mg total) by mouth 3 (three) times daily as needed for muscle spasms. 10/11/18  Yes Aline August, MD  benzonatate (TESSALON) 100 MG capsule Take 1 capsule (100 mg total) by mouth every 8 (eight) hours. Patient taking differently: Take 100 mg by mouth 3 (three) times daily as needed for cough.  09/19/18  Yes  Curt Bears, MD  Blood Glucose Monitoring Suppl (ACCU-CHEK AVIVA PLUS) w/Device KIT Use as dircted 07/12/17  Yes [name], MD  Cholecalciferol (VITAMIN D) 2000 units tablet Take 1 tablet (2,000 Units total) by mouth daily. 08/04/18  Yes Jamse Arn, MD  clopidogrel (PLAVIX) 75 MG tablet Take 1 tablet (75 mg total) by mouth daily. YOU MAY RESTART 08/26/2018 08/25/18  Yes Icard, Bradley L, DO  diclofenac sodium (VOLTAREN) 1 % GEL Apply 2 g topically 4 (four) times daily. Patient taking differently: Apply 2 g topically 4 (four) times daily as needed (pain).  04/20/18  Yes [name], MD  dronabinol (MARINOL) 5 MG capsule Take 1 capsule (5 mg total) by mouth 2 (two) times daily before a meal. 10/18/18  Yes Tyler Pita, MD  FLUoxetine (PROZAC) 40 MG capsule Take 1 capsule (40 mg total) by mouth daily. 04/20/18  Yes Newlin, Charlane Ferretti, MD  fluticasone (FLONASE) 50 MCG/ACT nasal spray Place 2 sprays into both nostrils daily as needed for allergies. 10/11/18  Yes Aline August, MD  Fluticasone-Salmeterol (ADVAIR) 100-50 MCG/DOSE AEPB Inhale 1 puff into the lungs 2 (two) times daily. 07/25/18  Yes [name], MD  glucose blood (ACCU-CHEK AVIVA) test strip Use as instructed 07/12/17  Yes Newlin, Enobong, MD  hydrALAZINE (APRESOLINE) 25 MG tablet Take 1 tablet (25 mg total) by mouth 2 (two) times daily. 04/20/18  Yes [name], MD  HYDROcodone-acetaminophen (NORCO) 5-325 MG tablet Take 1-2 tablets by mouth every 6 (six) hours as needed for moderate pain. 10/17/18  Yes Bruning, Ashlyn, PA-C  hydrocortisone-pramoxine (ANALPRAM-HC) 2.5-1 % rectal cream Place 1 application rectally as needed. Patient taking differently: Place 1 application rectally as needed for hemorrhoids or anal itching.  10/11/18  Yes Aline August, MD  isosorbide mononitrate (IMDUR) 120 MG 24 hr tablet Take 1 tablet (120 mg total) by mouth daily. 04/20/18  Yes [name], MD  lidocaine-prilocaine (EMLA) cream  Apply 1 application topically as needed. Apply 1 tsp on skin over port site one hour prior to chemotherapy. Cover with plastic wrap. Do not rub in medication. 10/03/18  Yes Curt Bears, MD  LORazepam (ATIVAN) 0.5 MG tablet 1 tab po q 4-6 hours prn or 1 tab po 30 minutes prior to radiation 08/30/18  Yes Hayden Pedro, PA-C  mirtazapine (REMERON) 30 MG tablet Take 1 tablet (30 mg total) by mouth at bedtime. 08/31/18  Yes Curt Bears, MD  Nebivolol HCl 20 MG TABS Take 1 tablet (20 mg total) by mouth daily. 01/26/18  Yes Hilty, Nadean Corwin, MD  nicotine (NICODERM CQ) 21 mg/24hr patch Place 1 patch (21 mg total) onto the skin daily. 08/31/18  Yes Curt Bears,  MD  nitroGLYCERIN (NITROSTAT) 0.4 MG SL tablet Place 1 tablet (0.4 mg total) under the tongue every 5 (five) minutes as needed for chest pain. 12/24/16  Yes Strader, [place] M, PA-C  ondansetron (ZOFRAN) 8 MG tablet Take 1 tablet (8 mg total) by mouth every 8 (eight) hours as needed for nausea or vomiting. 09/20/18  Yes Curt Bears, MD  pantoprazole (PROTONIX) 40 MG tablet Take 1 tablet (40 mg total) by mouth daily. 10/11/18  Yes Aline August, MD  prazosin (MINIPRESS) 1 MG capsule Take 1 capsule (1 mg total) by mouth at bedtime. For nightmares 07/25/18  Yes [name], MD  prochlorperazine (COMPAZINE) 10 MG tablet Take 1 tablet (10 mg total) by mouth every 6 (six) hours as needed for nausea or vomiting. 08/31/18  Yes Curt Bears, MD  ranolazine (RANEXA) 1000 MG SR tablet Take 1 tablet (1,000 mg total) by mouth 2 (two) times daily. 05/24/17  Yes Hilty, Nadean Corwin, MD  rosuvastatin (CRESTOR) 20 MG tablet Take 1 tablet (20 mg total) by mouth daily. 07/26/18  Yes [name], MD  sucralfate (CARAFATE) 1 g tablet Take 1 tablet by mouth 4 (four) times daily -  before meals and at bedtime. 09/23/18  Yes [provider]  tiotropium (SPIRIVA HANDIHALER) 18 MCG inhalation capsule Place 1 capsule (18 mcg total) into inhaler  and inhale daily. 04/20/18  Yes [name], MD  vitamin B-12 (CYANOCOBALAMIN) 500 MCG tablet Take 1 tablet (500 mcg total) by mouth daily. 06/30/18  Yes Jamse Arn, MD  zolpidem (AMBIEN) 5 MG tablet Take 1 tablet (5 mg total) by mouth at bedtime as needed for sleep. 09/14/18  Yes [name], MD  magic mouthwash SOLN Take 10 mLs by mouth 3 (three) times daily as needed for mouth pain. Patient not taking: Reported on 10/08/2018 09/24/18   Rodell Perna A, PA-C  promethazine (PHENERGAN) 25 MG tablet Take 1 tablet (25 mg total) by mouth every 6 (six) hours as needed for nausea or vomiting. 10/22/18   Deno Etienne, DO    Family History Family History  Problem Relation Age of Onset  . Heart attack Father   . Hypertension Mother   . Diabetes Mother   . Heart disease Brother        x 3   . Heart attack Brother        deceased  . Hypertension Sister   . Diabetes Sister   . Anesthesia problems Neg Hx   . Hypotension Neg Hx   . Malignant hyperthermia Neg Hx   . Pseudochol deficiency Neg Hx     Social History Social History   Tobacco Use  . Smoking status: Former Smoker    Packs/day: 0.25    Years: 25.00    Pack years: 6.25    Types: Cigarettes    Last attempt to quit: 05/24/2015    Years since quitting: 3.4  . Smokeless tobacco: Never Used  Substance Use Topics  . Alcohol use: No    Alcohol/week: 0.0 standard drinks  . Drug use: No     Allergies   Iohexol   Review of Systems Review of Systems  Constitutional: Negative for chills and fever.  HENT: Negative for congestion and facial swelling.   Eyes: Negative for discharge and visual disturbance.  Respiratory: Positive for cough. Negative for shortness of breath.   Cardiovascular: Negative for chest pain and palpitations.  Gastrointestinal: Negative for abdominal pain, diarrhea and vomiting.  Musculoskeletal: Negative for arthralgias and myalgias.  Skin:  Negative for color change and rash.  Neurological: Positive  for weakness (generalized). Negative for tremors, syncope and headaches.  Psychiatric/Behavioral: Negative for confusion and dysphoric mood.     Physical Exam Updated Vital Signs BP 124/84 (BP Location: Right Arm)   Pulse (!) 103   Temp 99.2 F (37.3 C) (Oral)   Resp 16   SpO2 96%   Physical Exam  Constitutional: He is oriented to person, place, and time. He appears well-developed and well-nourished.  HENT:  Head: Normocephalic and atraumatic.  Eyes: Pupils are equal, round, and reactive to light. EOM are normal.  Neck: Normal range of motion. Neck supple. No JVD present.  Cardiovascular: Regular rhythm. Tachycardia present. Exam reveals no gallop and no friction rub.  No murmur heard. Pulmonary/Chest: No respiratory distress. He has no wheezes.  Abdominal: He exhibits no distension and no mass. There is no tenderness. There is no rebound and no guarding.  Musculoskeletal: Normal range of motion.  Neurological: He is alert and oriented to person, place, and time.  Skin: No rash noted. No pallor.  Psychiatric: He has a normal mood and affect. His behavior is normal.  Nursing note and vitals reviewed.    ED Treatments / Results  Labs (all labs ordered are listed, but only abnormal results are displayed) Labs Reviewed  CBC WITH DIFFERENTIAL/PLATELET - Abnormal; Notable for the following components:      Result Value   RBC 3.59 (*)    Hemoglobin 9.8 (*)    HCT 30.5 (*)    nRBC 0.3 (*)    Lymphs Abs 0.5 (*)    Monocytes Absolute 1.6 (*)    Abs Immature Granulocytes 0.73 (*)    All other components within normal limits  COMPREHENSIVE METABOLIC PANEL - Abnormal; Notable for the following components:   Potassium 3.3 (*)    Creatinine, Ser 1.78 (*)    Albumin 3.3 (*)    GFR calc non Af Amer 43 (*)    GFR calc Af Amer 50 (*)    All other components within normal limits    EKG None  Radiology Dg Chest 2 View  Result Date: 10/22/2018 CLINICAL DATA:  52 year old male  with cough. EXAM: CHEST - 2 VIEW COMPARISON:  Chest CT dated 10/20/2018 FINDINGS: Left pectoral Port-A-Cath with tip in the region of the cavoatrial junction. The lungs are clear. The nodule seen in the right lung base on the prior CT is not visualized this radiograph. There is no pleural effusion or pneumothorax. The cardiac silhouette is within normal limits. Coronary vascular calcification noted. No acute osseous pathology. IMPRESSION: No active cardiopulmonary disease. Electronically Signed   By: Anner Crete M.D.   On: 10/22/2018 21:36    Procedures Procedures (including critical care time)  Medications Ordered in ED Medications  potassium chloride SA (K-DUR,KLOR-CON) CR tablet 40 mEq (has no administration in time range)  magnesium oxide (MAG-OX) tablet 800 mg (has no administration in time range)  heparin lock flush 100 unit/mL (has no administration in time range)  sodium chloride 0.9 % bolus 1,000 mL (0 mLs Intravenous Stopped 10/22/18 2222)  ondansetron (ZOFRAN) injection 4 mg (4 mg Intravenous Given 10/22/18 2153)  alum & mag hydroxide-simeth (MAALOX/MYLANTA) 200-200-20 MG/5ML suspension 15 mL (15 mLs Oral Given 10/22/18 2153)     Initial Impression / Assessment and Plan / ED Course  I have reviewed the triage vital signs and the nursing notes.  Pertinent labs & imaging results that were available during my care of the  patient were reviewed by me and considered in my medical decision making (see chart for details).     52 yo M with a chief complaint of nausea and generalized weakness.  Going on for the past few days.  Having trouble eating and drinking.  Will obtain lab work give a bolus of fluids nausea medicine GI cocktail and reassess.  Patient is feeling somewhat better on reassessment.  His potassium is mildly low at 3.3 will replete.  CXR negative for focal infiltrate as viewed by me. He has tolerated p.o. here in the ED.  Will discharge home and have him call his  oncologist in the morning.  11:37 PM:  I have discussed the diagnosis/risks/treatment options with the patient and family and believe the pt to be eligible for discharge home to follow-up with Oncology. We also discussed returning to the ED immediately if new or worsening sx occur. We discussed the sx which are most concerning (e.g., sudden worsening pain, fever, inability to tolerate by mouth) that necessitate immediate return. Medications administered to the patient during their visit and any new prescriptions provided to the patient are listed below.  Medications given during this visit Medications  potassium chloride SA (K-DUR,KLOR-CON) CR tablet 40 mEq (has no administration in time range)  magnesium oxide (MAG-OX) tablet 800 mg (has no administration in time range)  heparin lock flush 100 unit/mL (has no administration in time range)  sodium chloride 0.9 % bolus 1,000 mL (0 mLs Intravenous Stopped 10/22/18 2222)  ondansetron (ZOFRAN) injection 4 mg (4 mg Intravenous Given 10/22/18 2153)  alum & mag hydroxide-simeth (MAALOX/MYLANTA) 200-200-20 MG/5ML suspension 15 mL (15 mLs Oral Given 10/22/18 2153)      The patient appears reasonably screen and/or stabilized for discharge and I doubt any other medical condition or other [facility] requiring further screening, evaluation, or treatment in the ED at this time prior to discharge.    Final Clinical Impressions(s) / ED Diagnoses   Final diagnoses:  Neoplastic malignant related fatigue  Decreased appetite    ED Discharge Orders         Ordered    promethazine (PHENERGAN) 25 MG tablet  Every 6 hours PRN     10/22/18 [place], Dontrez Pettis, DO 10/22/18 2337

## 2018-10-22 NOTE — ED Notes (Signed)
Pt aware urinal sample is needed. Urinal placed by bedside.

## 2018-10-23 ENCOUNTER — Inpatient Hospital Stay: Payer: Medicare Other | Attending: Internal Medicine | Admitting: Internal Medicine

## 2018-10-23 ENCOUNTER — Inpatient Hospital Stay: Payer: Medicare Other

## 2018-10-23 ENCOUNTER — Ambulatory Visit
Admission: RE | Admit: 2018-10-23 | Discharge: 2018-10-23 | Disposition: A | Discharge status: Home / Self Care | Payer: Medicare Other | Source: Ambulatory Visit | Attending: Radiation Oncology | Admitting: Radiation Oncology

## 2018-10-23 ENCOUNTER — Encounter: Payer: Self-pay | Admitting: Internal Medicine

## 2018-10-23 ENCOUNTER — Telehealth: Payer: Self-pay | Admitting: Internal Medicine

## 2018-10-23 ENCOUNTER — Ambulatory Visit: Payer: Medicare Other

## 2018-10-23 VITALS — BP 134/89 | HR 101 | Temp 98.7°F | Resp 18 | Ht 69.0 in | Wt 209.5 lb

## 2018-10-23 DIAGNOSIS — R630 Anorexia: Secondary | ICD-10-CM | POA: Insufficient documentation

## 2018-10-23 DIAGNOSIS — Z510 Encounter for antineoplastic radiation therapy: Secondary | ICD-10-CM | POA: Diagnosis not present

## 2018-10-23 DIAGNOSIS — R0609 Other forms of dyspnea: Secondary | ICD-10-CM | POA: Diagnosis not present

## 2018-10-23 DIAGNOSIS — C3411 Malignant neoplasm of upper lobe, right bronchus or lung: Secondary | ICD-10-CM

## 2018-10-23 DIAGNOSIS — R5383 Other fatigue: Secondary | ICD-10-CM | POA: Insufficient documentation

## 2018-10-23 DIAGNOSIS — R110 Nausea: Secondary | ICD-10-CM | POA: Insufficient documentation

## 2018-10-23 DIAGNOSIS — F17200 Nicotine dependence, unspecified, uncomplicated: Secondary | ICD-10-CM

## 2018-10-23 DIAGNOSIS — E876 Hypokalemia: Secondary | ICD-10-CM | POA: Insufficient documentation

## 2018-10-23 DIAGNOSIS — Z5111 Encounter for antineoplastic chemotherapy: Secondary | ICD-10-CM | POA: Diagnosis not present

## 2018-10-23 DIAGNOSIS — R634 Abnormal weight loss: Secondary | ICD-10-CM | POA: Diagnosis not present

## 2018-10-23 DIAGNOSIS — I10 Essential (primary) hypertension: Secondary | ICD-10-CM

## 2018-10-23 DIAGNOSIS — R05 Cough: Secondary | ICD-10-CM | POA: Insufficient documentation

## 2018-10-23 LAB — CMP (CANCER CENTER ONLY)
ALT: 10 U/L (ref 0–44)
AST: 14 U/L — ABNORMAL LOW (ref 15–41)
Albumin: 3.3 g/dL — ABNORMAL LOW (ref 3.5–5.0)
Alkaline Phosphatase: 66 U/L (ref 38–126)
Anion gap: 11 (ref 5–15)
BUN: 17 mg/dL (ref 6–20)
CO2: 27 mmol/L (ref 22–32)
CREATININE: 1.67 mg/dL — AB (ref 0.61–1.24)
Calcium: 9.5 mg/dL (ref 8.9–10.3)
Chloride: 106 mmol/L (ref 98–111)
GFR, Est AFR Am: 54 mL/min — ABNORMAL LOW (ref >60)
GFR, Estimated: 46 mL/min — ABNORMAL LOW (ref >60)
Glucose, Bld: 126 mg/dL — ABNORMAL HIGH (ref 70–99)
Potassium: 3.7 mmol/L (ref 3.5–5.1)
Sodium: 144 mmol/L (ref 135–145)
Total Bilirubin: 0.2 mg/dL — ABNORMAL LOW (ref 0.3–1.2)
Total Protein: 7.2 g/dL (ref 6.5–8.1)

## 2018-10-23 LAB — CBC WITH DIFFERENTIAL (CANCER CENTER ONLY)
Abs Immature Granulocytes: 0.58 10*3/uL — ABNORMAL HIGH (ref 0.00–0.07)
Basophils Absolute: 0 10*3/uL (ref 0.0–0.1)
Basophils Relative: 0 %
Eosinophils Absolute: 0 10*3/uL (ref 0.0–0.5)
Eosinophils Relative: 1 %
HCT: 31.6 % — ABNORMAL LOW (ref 39.0–52.0)
Hemoglobin: 10.3 g/dL — ABNORMAL LOW (ref 13.0–17.0)
Immature Granulocytes: 10 %
Lymphocytes Relative: 6 %
Lymphs Abs: 0.4 10*3/uL — ABNORMAL LOW (ref 0.7–4.0)
MCH: 26.8 pg (ref 26.0–34.0)
MCHC: 32.6 g/dL (ref 30.0–36.0)
MCV: 82.1 fL (ref 80.0–100.0)
MONO ABS: 1.1 10*3/uL — AB (ref 0.1–1.0)
Monocytes Relative: 19 %
Neutro Abs: 3.9 10*3/uL (ref 1.7–7.7)
Neutrophils Relative %: 64 %
Platelet Count: 221 10*3/uL (ref 150–400)
RBC: 3.85 MIL/uL — AB (ref 4.22–5.81)
RDW: 12.8 % (ref 11.5–15.5)
WBC: 6 10*3/uL (ref 4.0–10.5)
nRBC: 0.3 % — ABNORMAL HIGH (ref 0.0–0.2)

## 2018-10-23 LAB — MAGNESIUM: Magnesium: 2.1 mg/dL (ref 1.7–2.4)

## 2018-10-23 MED ORDER — PROCHLORPERAZINE MALEATE 10 MG PO TABS: 10.0000 mg | ORAL_TABLET | Freq: Four times a day (QID) | ORAL | 0 refills | Status: DC | PRN

## 2018-10-23 MED FILL — PROCHLORPERAZINE 10 MG TAB: 10 | 7 days supply | Qty: 30 | Fill #0

## 2018-10-23 MED FILL — PROMETHAZINE 25 MG TABLET: 25 | 3 days supply | Qty: 10 | Fill #0

## 2018-10-23 NOTE — Progress Notes (Signed)
Andrew West Telephone:([phone]) [phone]   Fax:([phone]) [phone]  OFFICE PROGRESS NOTE  [name], MD [place] [address]  DIAGNOSIS: Limited stage (T3, N3, M0) small cell lung cancer presented with right paratracheal mass in addition to right suprahilar mass and lymphadenopathy as well as right cervical lymph node diagnosed in October 2019  PRIOR THERAPY: None  CURRENT THERAPY: systemic chemotherapy with cisplatin 80 mg/M2 on day 1 and etoposide 100 mg/M2 on days 1, 2 and 3 every 3 weeks.  Status post 2 cycles.  This will be concurrent with radiotherapy with the start of cycle #2.  INTERVAL HISTORY: Andrew West 52 y.o. male returns to the clinic today for follow-up visit accompanied by his wife and mother.  The patient is feeling fine today with no specific complaints except for fatigue.  He denied having any chest pain but has shortness of breath with exertion with mild cough and no hemoptysis.  He denied having any fever or chills.  He has intermittent nausea but no vomiting, diarrhea or constipation.  The patient lost around 20 pounds in the last few weeks.  He has lack of appetite.  He had repeat CT scan of the chest performed recently and he is here for evaluation and discussion of his discuss results.   MEDICAL HISTORY: Past Medical History:  Diagnosis Date  . Anxiety   . CAD (coronary artery disease)    a. s/p multiple PCIs with last cath 11/2016 with severe multivessel CAD, s/p PCTA to LCx but unable to pass stent  . Chronic leg pain    bilateral  . Chronic lower back pain   . COPD (chronic obstructive pulmonary disease) ([place])   . Depression   . GERD (gastroesophageal reflux disease)    Takes Dexilant  . HLD (hyperlipidemia)   . Hypertension   . Rhabdomyolysis    h/o, r/t statins  . Sleep apnea    "can't tolerate mask" (12/16/2016)  . Type II diabetes mellitus ([facility])     ALLERGIES:  is allergic to iohexol.  MEDICATIONS:    Current Outpatient Medications  Medication Sig Dispense Refill  . albuterol (PROAIR HFA) 108 (90 Base) MCG/ACT inhaler Inhale 1 puff into the lungs every 6 (six) hours as needed for wheezing or shortness of breath. 1 Inhaler 2  . amLODipine (NORVASC) 10 MG tablet Take 1 tablet (10 mg total) by mouth daily. 90 tablet 1  . aspirin 81 MG tablet Take 1 tablet (81 mg total) daily by mouth. 30 tablet   . baclofen (LIORESAL) 10 MG tablet Take 1 tablet (10 mg total) by mouth 3 (three) times daily as needed for muscle spasms.    . benzonatate (TESSALON) 100 MG capsule Take 1 capsule (100 mg total) by mouth every 8 (eight) hours. (Patient taking differently: Take 100 mg by mouth 3 (three) times daily as needed for cough. ) 21 capsule 0  . Blood Glucose Monitoring Suppl (ACCU-CHEK AVIVA PLUS) w/Device KIT Use as dircted 1 kit 0  . Cholecalciferol (VITAMIN D) 2000 units tablet Take 1 tablet (2,000 Units total) by mouth daily. 30 tablet 1  . clopidogrel (PLAVIX) 75 MG tablet Take 1 tablet (75 mg total) by mouth daily. YOU MAY RESTART 08/26/2018 90 tablet 1  . diclofenac sodium (VOLTAREN) 1 % GEL Apply 2 g topically 4 (four) times daily. (Patient taking differently: Apply 2 g topically 4 (four) times daily as needed (pain). ) 1 Tube 0  .  dronabinol (MARINOL) 5 MG capsule Take 1 capsule (5 mg total) by mouth 2 (two) times daily before a meal. 60 capsule 0  . FLUoxetine (PROZAC) 40 MG capsule Take 1 capsule (40 mg total) by mouth daily. 90 capsule 1  . fluticasone (FLONASE) 50 MCG/ACT nasal spray Place 2 sprays into both nostrils daily as needed for allergies.    . Fluticasone-Salmeterol (ADVAIR) 100-50 MCG/DOSE AEPB Inhale 1 puff into the lungs 2 (two) times daily. 1 each 3  . glucose blood (ACCU-CHEK AVIVA) test strip Use as instructed 100 each 12  . hydrALAZINE (APRESOLINE) 25 MG tablet Take 1 tablet (25 mg total) by mouth 2 (two) times daily. 180 tablet 1  . HYDROcodone-acetaminophen (NORCO) 5-325 MG tablet  Take 1-2 tablets by mouth every 6 (six) hours as needed for moderate pain. 120 tablet 0  . hydrocortisone-pramoxine (ANALPRAM-HC) 2.5-1 % rectal cream Place 1 application rectally as needed. (Patient taking differently: Place 1 application rectally as needed for hemorrhoids or anal itching. )    . isosorbide mononitrate (IMDUR) 120 MG 24 hr tablet Take 1 tablet (120 mg total) by mouth daily. 90 tablet 1  . lidocaine-prilocaine (EMLA) cream Apply 1 application topically as needed. Apply 1 tsp on skin over port site one hour prior to chemotherapy. Cover with plastic wrap. Do not rub in medication. 30 g 0  . LORazepam (ATIVAN) 0.5 MG tablet 1 tab po q 4-6 hours prn or 1 tab po 30 minutes prior to radiation 30 tablet 0  . magic mouthwash SOLN Take 10 mLs by mouth 3 (three) times daily as needed for mouth pain. (Patient not taking: Reported on 10/08/2018) 100 mL 0  . mirtazapine (REMERON) 30 MG tablet Take 1 tablet (30 mg total) by mouth at bedtime. 30 tablet 2  . Nebivolol HCl 20 MG TABS Take 1 tablet (20 mg total) by mouth daily. 90 tablet 3  . nicotine (NICODERM CQ) 21 mg/24hr patch Place 1 patch (21 mg total) onto the skin daily. 28 patch 0  . nitroGLYCERIN (NITROSTAT) 0.4 MG SL tablet Place 1 tablet (0.4 mg total) under the tongue every 5 (five) minutes as needed for chest pain. 25 tablet 2  . ondansetron (ZOFRAN) 8 MG tablet Take 1 tablet (8 mg total) by mouth every 8 (eight) hours as needed for nausea or vomiting. 20 tablet 0  . pantoprazole (PROTONIX) 40 MG tablet Take 1 tablet (40 mg total) by mouth daily.    . prazosin (MINIPRESS) 1 MG capsule Take 1 capsule (1 mg total) by mouth at bedtime. For nightmares 30 capsule 2  . prochlorperazine (COMPAZINE) 10 MG tablet Take 1 tablet (10 mg total) by mouth every 6 (six) hours as needed for nausea or vomiting. 30 tablet 0  . promethazine (PHENERGAN) 25 MG tablet Take 1 tablet (25 mg total) by mouth every 6 (six) hours as needed for nausea or vomiting. 10  tablet 0  . ranolazine (RANEXA) 1000 MG SR tablet Take 1 tablet (1,000 mg total) by mouth 2 (two) times daily. 180 tablet 3  . rosuvastatin (CRESTOR) 20 MG tablet Take 1 tablet (20 mg total) by mouth daily. 90 tablet 1  . sucralfate (CARAFATE) 1 g tablet Take 1 tablet by mouth 4 (four) times daily -  before meals and at bedtime.  0  . tiotropium (SPIRIVA HANDIHALER) 18 MCG inhalation capsule Place 1 capsule (18 mcg total) into inhaler and inhale daily. 90 capsule 1  . vitamin B-12 (CYANOCOBALAMIN) 500 MCG tablet Take  1 tablet (500 mcg total) by mouth daily. 30 tablet 1  . zolpidem (AMBIEN) 5 MG tablet Take 1 tablet (5 mg total) by mouth at bedtime as needed for sleep. 30 tablet 2   No current facility-administered medications for this visit.     SURGICAL HISTORY:  Past Surgical History:  Procedure Laterality Date  . BACK SURGERY    . BRONCHIAL BIOPSY  08/25/2018   Procedure: BRONCHIAL BIOPSIES;  Surgeon: Garner Nash, DO;  Location: [facility];  Service: Cardiopulmonary;;  . CARDIAC CATHETERIZATION N/A 09/25/2015   Procedure: Left Heart Cath and Coronary Angiography;  Surgeon: Leonie Man, MD;  Location: [facility];  Service: Cardiovascular;  Laterality: N/A;  . CARDIAC CATHETERIZATION N/A 12/16/2016   Procedure: Left Heart Cath and Coronary Angiography;  Surgeon: Leonie Man, MD;  Location: [facility];  Service: Cardiovascular;  Laterality: N/A;  . CARDIAC CATHETERIZATION N/A 12/16/2016   Procedure: Coronary Balloon Angioplasty;  Surgeon: Leonie Man, MD;  Location: [facility];  Service: Cardiovascular;  Laterality: N/A;  . COLONOSCOPY W/ POLYPECTOMY    . CORONARY ANGIOPLASTY  09/25/2015   mid cir & om  . CORONARY ANGIOPLASTY WITH STENT PLACEMENT  10/09/2001   PTCA & stenting of mid AV circumflex; 2.5x7m Pixel stent  . CORONARY ANGIOPLASTY WITH STENT PLACEMENT  12/13/2001   PCI with stent to mid L circumflex, 95% stenosis to 0% residual  .  CORONARY ANGIOPLASTY WITH STENT PLACEMENT  10/10/2003   PCI to mid AV circumflex; LAD 30% disease; RCA 100% occluded prox.  . CORONARY ANGIOPLASTY WITH STENT PLACEMENT  09/01/2011   PCI with stenting with bare metal stent to mid AV groove circumflex and PDA  . CORONARY ANGIOPLASTY WITH STENT PLACEMENT  10/17/2011   cutting balloon angioplasty of ostial lateral OM1 branch and bifurcation AV groove circumflex OM junction; stenosis reduced to 0%  . ENDOBRONCHIAL ULTRASOUND Bilateral 08/25/2018   Procedure: ENDOBRONCHIAL ULTRASOUND;  Surgeon: IGarner Nash DO;  Location: [facility];  Service: Cardiopulmonary;  Laterality: Bilateral;  . EXCISIONAL HEMORRHOIDECTOMY    . FINE NEEDLE ASPIRATION  08/25/2018   Procedure: FINE NEEDLE ASPIRATION;  Surgeon: IGarner Nash DO;  Location: [facility];  Service: Cardiopulmonary;;  . FLEXIBLE BRONCHOSCOPY  08/25/2018   Procedure: FLEXIBLE BRONCHOSCOPY;  Surgeon: IGarner Nash DO;  Location: [facility];  Service: Cardiopulmonary;;  . IR IMAGING GUIDED PORT INSERTION  09/29/2018  . LEFT HEART CATHETERIZATION WITH CORONARY ANGIOGRAM N/A 10/18/2011   Procedure: LEFT HEART CATHETERIZATION WITH CORONARY ANGIOGRAM;  Surgeon: DLeonie Man MD;  [facility];  Service: Cardiovascular;  Laterality: N/A;  . LUMBAR LAMINECTOMY/DECOMPRESSION MICRODISCECTOMY  03/31/2012   Procedure: LUMBAR LAMINECTOMY/DECOMPRESSION MICRODISCECTOMY 1 LEVEL;  Surgeon: HCharlie Pitter MD;  [facility];  Service: Neurosurgery;  Laterality: Left;  . TRANSTHORACIC ECHOCARDIOGRAM  07/28/2011   EF 55-65%; LVH, grade 1 diastolic dysfunction;     REVIEW OF SYSTEMS:  Constitutional: positive for anorexia, fatigue and weight loss Eyes: negative Ears, nose, mouth, throat, and face: negative Respiratory: positive for cough and dyspnea on exertion Cardiovascular: negative Gastrointestinal: negative Genitourinary:negative Integument/breast:  negative Hematologic/lymphatic: negative Musculoskeletal:negative Neurological: negative Behavioral/Psych: negative Endocrine: negative Allergic/Immunologic: negative   PHYSICAL EXAMINATION: General appearance: alert, cooperative, fatigued and no distress Head: Normocephalic, without obvious abnormality, atraumatic Neck: no adenopathy, no JVD, supple, symmetrical, trachea midline and thyroid not enlarged, symmetric, no tenderness/mass/nodules Lymph nodes: Cervical, supraclavicular, and axillary nodes normal. Resp: clear to auscultation bilaterally  Back: symmetric, no curvature. ROM normal. No CVA tenderness. Cardio: regular rate and rhythm, S1, S2 normal, no murmur, click, rub or gallop GI: soft, non-tender; bowel sounds normal; no masses,  no organomegaly Extremities: extremities normal, atraumatic, no cyanosis or edema Neurologic: Alert and oriented X 3, normal strength and tone. Normal symmetric reflexes. Normal coordination and gait  ECOG PERFORMANCE STATUS: 1 - Symptomatic but completely ambulatory  Blood pressure 134/89, pulse (!) 101, temperature 98.7 F (37.1 C), temperature source Oral, resp. rate 18, height _0  (1.753 m), weight 209 lb 8 oz (95 kg), SpO2 99 %.  LABORATORY DATA: Lab Results  Component Value Date   WBC 6.0 10/23/2018   HGB 10.3 (L) 10/23/2018   HCT 31.6 (L) 10/23/2018   MCV 82.1 10/23/2018   PLT 221 10/23/2018      Chemistry      Component Value Date/Time   NA 139 10/22/2018 2155   NA 144 07/25/2018 0949   K 3.3 (L) 10/22/2018 2155   CL 100 10/22/2018 2155   CO2 27 10/22/2018 2155   BUN 20 10/22/2018 2155   BUN 10 07/25/2018 0949   CREATININE 1.78 (H) 10/22/2018 2155   CREATININE 1.98 (H) 10/16/2018 0814   CREATININE 0.96 12/13/2016 1151      Component Value Date/Time   CALCIUM 8.9 10/22/2018 2155   ALKPHOS 55 10/22/2018 2155   AST 15 10/22/2018 2155   AST 12 (L) 10/16/2018 0814   ALT 12 10/22/2018 2155   ALT 11 10/16/2018 0814    BILITOT 0.6 10/22/2018 2155   BILITOT 0.4 10/16/2018 0814       RADIOGRAPHIC STUDIES: Dg Chest 2 View  Result Date: 10/22/2018 CLINICAL DATA:  52 year old male with cough. EXAM: CHEST - 2 VIEW COMPARISON:  Chest CT dated 10/20/2018 FINDINGS: Left pectoral Port-A-Cath with tip in the region of the cavoatrial junction. The lungs are clear. The nodule seen in the right lung base on the prior CT is not visualized this radiograph. There is no pleural effusion or pneumothorax. The cardiac silhouette is within normal limits. Coronary vascular calcification noted. No acute osseous pathology. IMPRESSION: No active cardiopulmonary disease. Electronically Signed   By: Andrew West M.D.   On: 10/22/2018 21:36   Dg Chest 2 View  Result Date: 10/08/2018 CLINICAL DATA:  Lung cancer, nausea, weakness, shortness of breath EXAM: CHEST - 2 VIEW COMPARISON:  09/24/2018 FINDINGS: Lungs are clear. No pleural effusion or pneumothorax. Prior mediastinal lymphadenopathy, particularly along the right paratracheal stripe, appears improved. The heart is normal in size. Left chest power port terminates in the lower SVC. Visualized osseous structures are within normal limits. IMPRESSION: No evidence of acute cardiopulmonary disease. Electronically Signed   By: Andrew West M.D.   On: 10/08/2018 11:24   Dg Chest 2 View  Result Date: 09/24/2018 CLINICAL DATA:  Sore throat. Small cell right lung cancer on chemotherapy and radiation therapy. EXAM: CHEST - 2 VIEW COMPARISON:  08/08/2018 chest radiograph. FINDINGS: Normal heart size. Thickening of the right paratracheal stripe is decreased. Otherwise stable mediastinal contour. No pneumothorax. No pleural effusion. Lungs appear clear, with no acute consolidative airspace disease and no pulmonary edema. IMPRESSION: Thickening of the right paratracheal stripe is decreased, compatible with treatment effect. No acute cardiopulmonary disease. Electronically Signed   By: Ilona Sorrel M.D.   On: 09/24/2018 15:59   Ct Chest Wo Contrast  Result Date: 10/20/2018 CLINICAL DATA:  Small cell lung cancer, diagnosed September 2019, chemotherapy/XRT ongoing EXAM: CT CHEST WITHOUT  CONTRAST TECHNIQUE: Multidetector CT imaging of the chest was performed following the standard protocol without IV contrast. COMPARISON:  PET-CT dated 08/21/2018.  CT chest dated 07/31/2018. FINDINGS: Cardiovascular: Heart is normal in size.  No pericardial effusion. No evidence of thoracic aortic aneurysm. Atherosclerotic calcifications of the aortic arch. Three vessel coronary atherosclerosis. Mediastinum/Nodes: Mediastinal lymphadenopathy, improved, including: --1.3 cm short axis right paratracheal node (series 2/image 84), previously 3.3 cm --1.0 cm short axis AP window node (series 2/image 60), previously 1.4 cm --1.5 cm short axis subcarinal node (series 2/image 32), previously 1.9 cm Bilateral hilar regions are poorly evaluated in the absence of intravenous contrast. Visualized thyroid is unremarkable. Lungs/Pleura: Mild centrilobular and paraseptal emphysematous changes, upper lobe predominant. Very mild ground-glass peribronchovascular nodularity in the right upper lobe (series 5/image 85), nonspecific. 2.3 x 1.2 cm thick-walled cavitary subpleural nodule in the lateral right lung base (series 5/image 115), new. No focal consolidation. No pleural effusion or pneumothorax. Upper Abdomen: Visualized upper abdomen is unremarkable. Musculoskeletal: Mild degenerative changes of the visualized thoracolumbar spine. IMPRESSION: Improving mediastinal lymphadenopathy, as above. Bilateral hilar regions are poorly evaluated in the absence of intravenous contrast. 2.3 x 1.2 cm thick-walled cavitary nodule in the lateral right lung base, new. This may be secondary to radiation change. Attention on follow-up is suggested. Aortic Atherosclerosis (ICD10-I70.0) and Emphysema (ICD10-J43.9). Electronically Signed   By: Andrew West M.D.   On: 10/20/2018 09:28   Dg Esophagus  Result Date: 10/09/2018 CLINICAL DATA:  Sore throat and chest discomfort with sensation of food sticking. Odynophagia. Stage III lung cancer. EXAM: ESOPHOGRAM / BARIUM SWALLOW / BARIUM TABLET STUDY TECHNIQUE: Combined double contrast and single contrast examination performed using effervescent crystals, thick barium liquid, and thin barium liquid. The patient was observed with fluoroscopy swallowing a 13 mm barium sulphate tablet. FLUOROSCOPY TIME:  Fluoroscopy Time:  2 minutes, 24 seconds Radiation Exposure Index (if provided by the fluoroscopic device): 15.2 mGy Number of Acquired Spot Images: 0 COMPARISON:  PET-CT from 08/21/2018 FINDINGS: During the pharyngeal phase of contrast, there was mild vallecular and piriform stasis. Primary peristaltic waves were intact on 4/4 swallows. Starting in the upper thoracic esophagus and extending over at about 5.5 cm length, there is a smoothly marginated narrowing of the proximal esophagus at which I could only distend the esophagus up to about 12 mm in one projection in 10 mm and another. The 13 mm barium tablet impacted in this vicinity in would not progressed despite numerous swallows. In addition, I was unable to fully distend the distal esophagus, maximal distention was about 12 mm. Because the pill did not pass from the proximal esophagus, I was unable to challenge the distal esophagus with the pill. There is atherosclerotic calcification of the aortic arch. IMPRESSION: 1. Approximately 5.5 cm smooth narrowing of the proximal esophagus. This is near the region of the patient's right paratracheal adenopathy. The narrowing could be related to radiation therapy or, less likely, mass effect from the paratracheal adenopathy on the trachea causing compression of the esophagus between the trachea and the aorta. 2. I was not able to distend the distal esophagus beyond about 12 mm, and accordingly a smooth narrowing in  the distal esophagus is likewise not excluded. 3.  Aortic Atherosclerosis (ICD10-I70.0). Electronically Signed   By: Andrew West M.D.   On: 10/09/2018 10:12   Ir Imaging Guided Port Insertion  Result Date: 09/29/2018 INDICATION: 52 year old male with a history of right hilar lung cancer EXAM: IMPLANTED PORT A CATH PLACEMENT  WITH ULTRASOUND AND FLUOROSCOPIC GUIDANCE MEDICATIONS: 2 g Ancef; The antibiotic was administered within an appropriate time interval prior to skin puncture. ANESTHESIA/SEDATION: Moderate (conscious) sedation was employed during this procedure. A total of Versed 4.0 mg and Fentanyl 100 mcg was administered intravenously. Moderate Sedation Time: 21 minutes. The patient's level of consciousness and vital signs were monitored continuously by radiology nursing throughout the procedure under my direct supervision. FLUOROSCOPY TIME:  0 minutes, 42 seconds (40.9 mGy) COMPLICATIONS: None PROCEDURE: The procedure, risks, benefits, and alternatives were explained to the patient. Questions regarding the procedure were encouraged and answered. The patient understands and consents to the procedure. Ultrasound survey was performed with images stored and sent to PACs. The left neck and chest was prepped with chlorhexidine, and draped in the usual sterile fashion using maximum barrier technique (cap and mask, sterile gown, sterile gloves, large sterile sheet, hand hygiene and cutaneous antiseptic). Antibiotic prophylaxis was provided with 2.0g Ancef administered IV one hour prior to skin incision. Local anesthesia was attained by infiltration with 1% lidocaine without epinephrine. Ultrasound demonstrated patency of the left internal jugular vein, and this was documented with an image. Under real-time ultrasound guidance, this vein was accessed with a 21 gauge micropuncture needle and image documentation was performed. A small dermatotomy was made at the access site with an 11 scalpel. A 0.018" wire  was advanced into the SVC and used to estimate the length of the internal catheter. The access needle exchanged for a 51F micropuncture vascular sheath. The 0.018" wire was then removed and a 0.035" wire advanced into the IVC. An appropriate location for the subcutaneous reservoir was selected below the clavicle and an incision was made through the skin and underlying soft tissues. The subcutaneous tissues were then dissected using a combination of blunt and sharp surgical technique and a pocket was formed. A single lumen power injectable portacatheter was then tunneled through the subcutaneous tissues from the pocket to the dermatotomy and the port reservoir placed within the subcutaneous pocket. The venous access site was then serially dilated and a peel away vascular sheath placed over the wire. The wire was removed and the port catheter advanced into position under fluoroscopic guidance. The catheter tip is positioned in the cavoatrial junction. This was documented with a spot image. The portacatheter was then tested and found to flush and aspirate well. The port was flushed with saline followed by 100 units/mL heparinized saline. The pocket was then closed in two layers using first subdermal inverted interrupted absorbable sutures followed by a running subcuticular suture. The epidermis was then sealed with Dermabond. The dermatotomy at the venous access site was also seal with Dermabond. Patient tolerated the procedure well and remained hemodynamically stable throughout. No complications encountered and no significant blood loss encountered IMPRESSION: Status post placement of left internal jugular port catheter. Catheter ready for use. Signed, Andrew West. Dellia Nims, RPVI [facility] Rad[facility] Electronically Signed   By: Andrew West D.O.   On: 09/29/2018 16:36    ASSESSMENT AND PLAN: This is a very pleasant 52 years old African-American male recently diagnosed  with limited stage small cell lung cancer and currently undergoing systemic chemotherapy with cisplatin and etoposide status post 2 cycles.  This is concurrent with radiotherapy. He continues to tolerate this treatment well except for the fatigue and lack of appetite.  He has 5 more fraction of radiotherapy to complete the concurrent radiation. The patient had a repeat CT scan of the chest performed  recently.  I personally and independently reviewed the scan images and discussed the results with the patient and his family. His scan showed improvement of his disease but there was an inflammatory lesion in the right lung that need close observation. I recommended for the patient to proceed with cycle #3 tomorrow as scheduled. I will see the patient back for follow-up visit in 3 weeks for evaluation before starting cycle #4. For the nausea, I will give him a refill of Compazine. The patient was advised to call immediately if he has any concerning symptoms in the interval.  The patient voices understanding of current disease status and treatment options and is in agreement with the current care plan. All questions were answered. The patient knows to call the clinic with any problems, questions or concerns. We can certainly see the patient much sooner if necessary.  Disclaimer: This note was dictated with voice recognition software. Similar sounding words can inadvertently be transcribed and may not be corrected upon review.

## 2018-10-23 NOTE — Telephone Encounter (Signed)
Scheduled appt per 12/2 los - pt to get an updated schedule in treatment area.

## 2018-10-24 ENCOUNTER — Inpatient Hospital Stay: Payer: Medicare Other

## 2018-10-24 ENCOUNTER — Ambulatory Visit: Payer: Medicare Other

## 2018-10-24 ENCOUNTER — Ambulatory Visit
Admission: RE | Admit: 2018-10-24 | Discharge: 2018-10-24 | Disposition: A | Discharge status: Home / Self Care | Payer: Medicare Other | Source: Ambulatory Visit | Attending: Radiation Oncology | Admitting: Radiation Oncology

## 2018-10-24 VITALS — BP 95/59 | HR 100 | Temp 98.3°F | Resp 18

## 2018-10-24 DIAGNOSIS — R05 Cough: Secondary | ICD-10-CM | POA: Diagnosis not present

## 2018-10-24 DIAGNOSIS — E876 Hypokalemia: Secondary | ICD-10-CM | POA: Diagnosis not present

## 2018-10-24 DIAGNOSIS — C3411 Malignant neoplasm of upper lobe, right bronchus or lung: Secondary | ICD-10-CM | POA: Diagnosis not present

## 2018-10-24 DIAGNOSIS — R110 Nausea: Secondary | ICD-10-CM | POA: Diagnosis not present

## 2018-10-24 DIAGNOSIS — Z5111 Encounter for antineoplastic chemotherapy: Secondary | ICD-10-CM | POA: Diagnosis not present

## 2018-10-24 DIAGNOSIS — R0609 Other forms of dyspnea: Secondary | ICD-10-CM | POA: Diagnosis not present

## 2018-10-24 DIAGNOSIS — Z510 Encounter for antineoplastic radiation therapy: Secondary | ICD-10-CM | POA: Diagnosis not present

## 2018-10-24 MED ORDER — PALONOSETRON HCL INJECTION 0.25 MG/5ML
INTRAVENOUS | Status: AC
  Filled 2018-10-24: qty 5

## 2018-10-24 MED ORDER — SODIUM CHLORIDE 0.9 % IV SOLN
Freq: Once | INTRAVENOUS | Status: AC
  Administered 2018-10-24: 09:00:00 via INTRAVENOUS
  Filled 2018-10-24: qty 250

## 2018-10-24 MED ORDER — SODIUM CHLORIDE 0.9 % IV SOLN
100.0000 mg/m2 | Freq: Once | INTRAVENOUS | Status: AC
  Administered 2018-10-24: 230 mg via INTRAVENOUS
  Filled 2018-10-24: qty 11.5

## 2018-10-24 MED ORDER — SODIUM CHLORIDE 0.9 % IV SOLN
Freq: Once | INTRAVENOUS | Status: AC
  Administered 2018-10-24: 13:00:00 via INTRAVENOUS
  Filled 2018-10-24: qty 5

## 2018-10-24 MED ORDER — PALONOSETRON HCL INJECTION 0.25 MG/5ML
0.2500 mg | Freq: Once | INTRAVENOUS | Status: AC
  Administered 2018-10-24: 0.25 mg via INTRAVENOUS

## 2018-10-24 MED ORDER — POTASSIUM CHLORIDE 2 MEQ/ML IV SOLN
Freq: Once | INTRAVENOUS | Status: AC
  Administered 2018-10-24: 10:00:00 via INTRAVENOUS
  Filled 2018-10-24: qty 10

## 2018-10-24 MED ORDER — HEPARIN SOD (PORK) LOCK FLUSH 100 UNIT/ML IV SOLN
500.0000 [IU] | Freq: Once | INTRAVENOUS | Status: AC | PRN
  Administered 2018-10-24: 500 [IU]
  Filled 2018-10-24: qty 5

## 2018-10-24 MED ORDER — SODIUM CHLORIDE 0.9% FLUSH
10.0000 mL | INTRAVENOUS | Status: DC | PRN
  Administered 2018-10-24: 10 mL
  Filled 2018-10-24: qty 10

## 2018-10-24 MED ORDER — SODIUM CHLORIDE 0.9 % IV SOLN
80.0000 mg/m2 | Freq: Once | INTRAVENOUS | Status: AC
  Administered 2018-10-24: 186 mg via INTRAVENOUS
  Filled 2018-10-24: qty 186

## 2018-10-24 NOTE — Progress Notes (Signed)
Nutrition Follow-up:  Patient with lung cancer followed by Dr. Julien Nordmann.  Noted recent hospital admission 11/17-11/20 and ED visit on 12/1.  Patient receiving chemotherapy and radiation therapy.  Noted final radiation treatment on 12/6.  Met with patient, wife and mother during infusion today.  Patient kept blanket over face during visit and did not speak to RD.  Wife reports appetite is still poor and patient will not eat.  "He eats maybe a bite or two." Drinks some of the boost/ensure per wife.  "He takes a bite or two and that is it."  Wife reports that he is taking all his medicine, including appetite stimulants (marinol and remeron).      Medications: reviewed  Labs: glucose 126, creatinine 1.67  Anthropometrics:   Weight decreased to 209 lb 8 oz on 12/2 from 228 lb 3.2 on 11/11.    UBW 240-244 lb  245 lb noted on 10/22  15% weight loss in the last 1 1/2 months, significant   NUTRITION DIAGNOSIS: Inadequate oral intake continues   MALNUTRITION DIAGNOSIS: Patient meet criteria for severe malnutrition in acute illness likely progressing to chronic illness as evidenced by 15% weight loss in the last 1 1/2 months and eating < 50% energy needs for > or equal to 5 days.    INTERVENTION:  Concerned with nutritional status with recent hospital admission and ED visit and significant weight loss.  If aggressive nutrition therapy is wanted would recommend considering feeding tube placement.  Reviewed importance of good nutrition and encouraged patient that every bite and sip counts at this point.  Encouraged small frequent nibbles of food and beverage q 1 hour. Patient never responded or took blanket off that was over head.      MONITORING, EVALUATION, GOAL: Patient to increase calories and protein to maintain weight   NEXT VISIT: December 23 during infusion  Jalei Shibley B. Zenia Resides, [place], [place] Registered Dietitian [phone] (pager)

## 2018-10-24 NOTE — Progress Notes (Signed)
Ok to tx today per Dr. Julien Nordmann.  Per pharmacy; do not run post fluids with etoposide.

## 2018-10-24 NOTE — Patient Instructions (Signed)
[place] Discharge Instructions for Patients Receiving Chemotherapy  Today you received the following chemotherapy agents: Cisplatin & Etoposide.  To help prevent nausea and vomiting after your treatment, we encourage you to take your nausea medication as prescribed.   If you develop nausea and vomiting that is not controlled by your nausea medication, call the clinic.   BELOW ARE SYMPTOMS THAT SHOULD BE REPORTED IMMEDIATELY:  *FEVER GREATER THAN 100.5 F  *CHILLS WITH OR WITHOUT FEVER  NAUSEA AND VOMITING THAT IS NOT CONTROLLED WITH YOUR NAUSEA MEDICATION  *UNUSUAL SHORTNESS OF BREATH  *UNUSUAL BRUISING OR BLEEDING  TENDERNESS IN MOUTH AND THROAT WITH OR WITHOUT PRESENCE OF ULCERS  *URINARY PROBLEMS  *BOWEL PROBLEMS  UNUSUAL RASH Items with * indicate a potential emergency and should be followed up as soon as possible.  Feel free to call the clinic should you have any questions or concerns. The clinic phone number is [phone].  Please show the [facility] at check-in to the Emergency Department and triage nurse.

## 2018-10-25 ENCOUNTER — Inpatient Hospital Stay: Payer: Medicare Other

## 2018-10-25 ENCOUNTER — Ambulatory Visit: Payer: Medicare Other

## 2018-10-25 VITALS — BP 138/94 | HR 97 | Temp 97.7°F | Resp 16

## 2018-10-25 DIAGNOSIS — R110 Nausea: Secondary | ICD-10-CM | POA: Diagnosis not present

## 2018-10-25 DIAGNOSIS — C3411 Malignant neoplasm of upper lobe, right bronchus or lung: Secondary | ICD-10-CM

## 2018-10-25 DIAGNOSIS — R0609 Other forms of dyspnea: Secondary | ICD-10-CM | POA: Diagnosis not present

## 2018-10-25 DIAGNOSIS — R05 Cough: Secondary | ICD-10-CM | POA: Diagnosis not present

## 2018-10-25 DIAGNOSIS — E876 Hypokalemia: Secondary | ICD-10-CM | POA: Diagnosis not present

## 2018-10-25 DIAGNOSIS — Z5111 Encounter for antineoplastic chemotherapy: Secondary | ICD-10-CM | POA: Diagnosis not present

## 2018-10-25 MED ORDER — HEPARIN SOD (PORK) LOCK FLUSH 100 UNIT/ML IV SOLN
500.0000 [IU] | Freq: Once | INTRAVENOUS | Status: AC | PRN
  Administered 2018-10-25: 500 [IU]
  Filled 2018-10-25: qty 5

## 2018-10-25 MED ORDER — SODIUM CHLORIDE 0.9 % IV SOLN
Freq: Once | INTRAVENOUS | Status: AC
  Administered 2018-10-25: 09:00:00 via INTRAVENOUS
  Filled 2018-10-25: qty 250

## 2018-10-25 MED ORDER — DEXAMETHASONE SODIUM PHOSPHATE 10 MG/ML IJ SOLN
INTRAMUSCULAR | Status: AC
  Filled 2018-10-25: qty 1

## 2018-10-25 MED ORDER — SODIUM CHLORIDE 0.9% FLUSH
10.0000 mL | INTRAVENOUS | Status: DC | PRN
  Administered 2018-10-25: 10 mL
  Filled 2018-10-25: qty 10

## 2018-10-25 MED ORDER — SODIUM CHLORIDE 0.9 % IV SOLN
100.0000 mg/m2 | Freq: Once | INTRAVENOUS | Status: AC
  Administered 2018-10-25: 230 mg via INTRAVENOUS
  Filled 2018-10-25: qty 11.5

## 2018-10-25 MED ORDER — DEXAMETHASONE SODIUM PHOSPHATE 10 MG/ML IJ SOLN
10.0000 mg | Freq: Once | INTRAMUSCULAR | Status: AC
  Administered 2018-10-25: 10 mg via INTRAVENOUS

## 2018-10-25 NOTE — Patient Instructions (Signed)
[place] Discharge Instructions for Patients Receiving Chemotherapy  Today you received the following chemotherapy agents etoposide  To help prevent nausea and vomiting after your treatment, we encourage you to take your nausea medication as directed  If you develop nausea and vomiting that is not controlled by your nausea medication, call the clinic.   BELOW ARE SYMPTOMS THAT SHOULD BE REPORTED IMMEDIATELY:  *FEVER GREATER THAN 100.5 F  *CHILLS WITH OR WITHOUT FEVER  NAUSEA AND VOMITING THAT IS NOT CONTROLLED WITH YOUR NAUSEA MEDICATION  *UNUSUAL SHORTNESS OF BREATH  *UNUSUAL BRUISING OR BLEEDING  TENDERNESS IN MOUTH AND THROAT WITH OR WITHOUT PRESENCE OF ULCERS  *URINARY PROBLEMS  *BOWEL PROBLEMS  UNUSUAL RASH Items with * indicate a potential emergency and should be followed up as soon as possible.  Feel free to call the clinic you have any questions or concerns. The clinic phone number is [phone].

## 2018-10-26 ENCOUNTER — Ambulatory Visit: Payer: Medicare Other

## 2018-10-26 ENCOUNTER — Inpatient Hospital Stay: Payer: Medicare Other

## 2018-10-26 VITALS — BP 146/108 | HR 92 | Temp 98.7°F | Resp 18

## 2018-10-26 DIAGNOSIS — C3411 Malignant neoplasm of upper lobe, right bronchus or lung: Secondary | ICD-10-CM | POA: Diagnosis not present

## 2018-10-26 DIAGNOSIS — Z5111 Encounter for antineoplastic chemotherapy: Secondary | ICD-10-CM | POA: Diagnosis not present

## 2018-10-26 DIAGNOSIS — E876 Hypokalemia: Secondary | ICD-10-CM | POA: Diagnosis not present

## 2018-10-26 DIAGNOSIS — Z510 Encounter for antineoplastic radiation therapy: Secondary | ICD-10-CM | POA: Diagnosis not present

## 2018-10-26 DIAGNOSIS — R05 Cough: Secondary | ICD-10-CM | POA: Diagnosis not present

## 2018-10-26 DIAGNOSIS — R0609 Other forms of dyspnea: Secondary | ICD-10-CM | POA: Diagnosis not present

## 2018-10-26 DIAGNOSIS — R110 Nausea: Secondary | ICD-10-CM | POA: Diagnosis not present

## 2018-10-26 MED ORDER — PROCHLORPERAZINE MALEATE 10 MG PO TABS
10.0000 mg | ORAL_TABLET | Freq: Once | ORAL | Status: AC
  Administered 2018-10-26: 10 mg via ORAL

## 2018-10-26 MED ORDER — SODIUM CHLORIDE 0.9 % IV SOLN
100.0000 mg/m2 | Freq: Once | INTRAVENOUS | Status: AC
  Administered 2018-10-26: 230 mg via INTRAVENOUS
  Filled 2018-10-26: qty 11.5

## 2018-10-26 MED ORDER — PROCHLORPERAZINE MALEATE 10 MG PO TABS
ORAL_TABLET | ORAL | Status: AC
  Filled 2018-10-26: qty 1

## 2018-10-26 MED ORDER — HEPARIN SOD (PORK) LOCK FLUSH 100 UNIT/ML IV SOLN
500.0000 [IU] | Freq: Once | INTRAVENOUS | Status: AC | PRN
  Administered 2018-10-26: 500 [IU]
  Filled 2018-10-26: qty 5

## 2018-10-26 MED ORDER — SODIUM CHLORIDE 0.9 % IV SOLN
Freq: Once | INTRAVENOUS | Status: AC
  Administered 2018-10-26: 09:00:00 via INTRAVENOUS
  Filled 2018-10-26: qty 250

## 2018-10-26 MED ORDER — DEXAMETHASONE SODIUM PHOSPHATE 10 MG/ML IJ SOLN
INTRAMUSCULAR | Status: AC
  Filled 2018-10-26: qty 1

## 2018-10-26 MED ORDER — SODIUM CHLORIDE 0.9% FLUSH
10.0000 mL | INTRAVENOUS | Status: DC | PRN
  Administered 2018-10-26: 10 mL
  Filled 2018-10-26: qty 10

## 2018-10-26 MED ORDER — DEXAMETHASONE SODIUM PHOSPHATE 10 MG/ML IJ SOLN
10.0000 mg | Freq: Once | INTRAMUSCULAR | Status: AC
  Administered 2018-10-26: 10 mg via INTRAVENOUS

## 2018-10-26 NOTE — Progress Notes (Signed)
Dr. Truman Hayward aware of BP, Pt took his BP meds at home. OK to tx

## 2018-10-26 NOTE — Patient Instructions (Signed)
[place] Discharge Instructions for Patients Receiving Chemotherapy  Today you received the following chemotherapy agents etoposide  To help prevent nausea and vomiting after your treatment, we encourage you to take your nausea medication as directed  If you develop nausea and vomiting that is not controlled by your nausea medication, call the clinic.   BELOW ARE SYMPTOMS THAT SHOULD BE REPORTED IMMEDIATELY:  *FEVER GREATER THAN 100.5 F  *CHILLS WITH OR WITHOUT FEVER  NAUSEA AND VOMITING THAT IS NOT CONTROLLED WITH YOUR NAUSEA MEDICATION  *UNUSUAL SHORTNESS OF BREATH  *UNUSUAL BRUISING OR BLEEDING  TENDERNESS IN MOUTH AND THROAT WITH OR WITHOUT PRESENCE OF ULCERS  *URINARY PROBLEMS  *BOWEL PROBLEMS  UNUSUAL RASH Items with * indicate a potential emergency and should be followed up as soon as possible.  Feel free to call the clinic you have any questions or concerns. The clinic phone number is [phone].

## 2018-10-27 ENCOUNTER — Ambulatory Visit: Payer: Medicare Other

## 2018-10-27 ENCOUNTER — Ambulatory Visit
Admission: RE | Admit: 2018-10-27 | Discharge: 2018-10-27 | Disposition: A | Discharge status: Home / Self Care | Payer: Medicare Other | Source: Ambulatory Visit | Attending: Radiation Oncology | Admitting: Radiation Oncology

## 2018-10-27 DIAGNOSIS — C3411 Malignant neoplasm of upper lobe, right bronchus or lung: Secondary | ICD-10-CM | POA: Diagnosis not present

## 2018-10-27 DIAGNOSIS — Z510 Encounter for antineoplastic radiation therapy: Secondary | ICD-10-CM | POA: Diagnosis not present

## 2018-10-30 ENCOUNTER — Ambulatory Visit
Admission: RE | Admit: 2018-10-30 | Discharge: 2018-10-30 | Disposition: A | Discharge status: Home / Self Care | Payer: Medicare Other | Source: Ambulatory Visit | Attending: Radiation Oncology | Admitting: Radiation Oncology

## 2018-10-30 ENCOUNTER — Inpatient Hospital Stay: Payer: Medicare Other

## 2018-10-30 ENCOUNTER — Other Ambulatory Visit: Payer: Self-pay | Admitting: Oncology

## 2018-10-30 ENCOUNTER — Telehealth: Payer: Self-pay | Admitting: *Deleted

## 2018-10-30 ENCOUNTER — Encounter: Payer: Self-pay | Admitting: Radiation Oncology

## 2018-10-30 ENCOUNTER — Ambulatory Visit: Payer: Medicare Other | Admitting: Physician Assistant

## 2018-10-30 ENCOUNTER — Telehealth: Payer: Self-pay | Admitting: Internal Medicine

## 2018-10-30 DIAGNOSIS — C3490 Malignant neoplasm of unspecified part of unspecified bronchus or lung: Secondary | ICD-10-CM | POA: Diagnosis not present

## 2018-10-30 DIAGNOSIS — J440 Chronic obstructive pulmonary disease with acute lower respiratory infection: Secondary | ICD-10-CM | POA: Diagnosis not present

## 2018-10-30 DIAGNOSIS — C3411 Malignant neoplasm of upper lobe, right bronchus or lung: Secondary | ICD-10-CM | POA: Diagnosis not present

## 2018-10-30 DIAGNOSIS — E43 Unspecified severe protein-calorie malnutrition: Secondary | ICD-10-CM | POA: Diagnosis not present

## 2018-10-30 DIAGNOSIS — J189 Pneumonia, unspecified organism: Secondary | ICD-10-CM | POA: Diagnosis not present

## 2018-10-30 DIAGNOSIS — R000 Tachycardia, unspecified: Secondary | ICD-10-CM | POA: Diagnosis not present

## 2018-10-30 DIAGNOSIS — N179 Acute kidney failure, unspecified: Secondary | ICD-10-CM | POA: Diagnosis not present

## 2018-10-30 DIAGNOSIS — R509 Fever, unspecified: Secondary | ICD-10-CM | POA: Diagnosis not present

## 2018-10-30 DIAGNOSIS — K429 Umbilical hernia without obstruction or gangrene: Secondary | ICD-10-CM | POA: Diagnosis not present

## 2018-10-30 DIAGNOSIS — E860 Dehydration: Secondary | ICD-10-CM

## 2018-10-30 DIAGNOSIS — D61810 Antineoplastic chemotherapy induced pancytopenia: Secondary | ICD-10-CM | POA: Diagnosis not present

## 2018-10-30 LAB — CBC WITH DIFFERENTIAL (CANCER CENTER ONLY)
Abs Immature Granulocytes: 0.03 10*3/uL (ref 0.00–0.07)
Basophils Absolute: 0 10*3/uL (ref 0.0–0.1)
Basophils Relative: 0 %
Eosinophils Absolute: 0 10*3/uL (ref 0.0–0.5)
Eosinophils Relative: 0 %
HEMATOCRIT: 28.5 % — AB (ref 39.0–52.0)
HEMOGLOBIN: 9.3 g/dL — AB (ref 13.0–17.0)
Immature Granulocytes: 1 %
LYMPHS PCT: 3 %
Lymphs Abs: 0.1 10*3/uL — ABNORMAL LOW (ref 0.7–4.0)
MCH: 26.6 pg (ref 26.0–34.0)
MCHC: 32.6 g/dL (ref 30.0–36.0)
MCV: 81.7 fL (ref 80.0–100.0)
Monocytes Absolute: 0 10*3/uL — ABNORMAL LOW (ref 0.1–1.0)
Monocytes Relative: 1 %
NEUTROS ABS: 3.7 10*3/uL (ref 1.7–7.7)
Neutrophils Relative %: 95 %
Platelet Count: 203 10*3/uL (ref 150–400)
RBC: 3.49 MIL/uL — ABNORMAL LOW (ref 4.22–5.81)
RDW: 13.1 % (ref 11.5–15.5)
WBC Count: 3.9 10*3/uL — ABNORMAL LOW (ref 4.0–10.5)
nRBC: 0 % (ref 0.0–0.2)

## 2018-10-30 LAB — CMP (CANCER CENTER ONLY)
ALT: 9 U/L (ref 0–44)
AST: 13 U/L — AB (ref 15–41)
Albumin: 3.1 g/dL — ABNORMAL LOW (ref 3.5–5.0)
Alkaline Phosphatase: 52 U/L (ref 38–126)
Anion gap: 14 (ref 5–15)
BUN: 37 mg/dL — ABNORMAL HIGH (ref 6–20)
CO2: 26 mmol/L (ref 22–32)
Calcium: 9.3 mg/dL (ref 8.9–10.3)
Chloride: 104 mmol/L (ref 98–111)
Creatinine: 2.21 mg/dL — ABNORMAL HIGH (ref 0.61–1.24)
GFR, Est AFR Am: 38 mL/min — ABNORMAL LOW (ref >60)
GFR, Estimated: 33 mL/min — ABNORMAL LOW (ref >60)
Glucose, Bld: 100 mg/dL — ABNORMAL HIGH (ref 70–99)
Potassium: 3.7 mmol/L (ref 3.5–5.1)
Sodium: 144 mmol/L (ref 135–145)
Total Bilirubin: 0.7 mg/dL (ref 0.3–1.2)
Total Protein: 6.8 g/dL (ref 6.5–8.1)

## 2018-10-30 LAB — MAGNESIUM: Magnesium: 1.8 mg/dL (ref 1.7–2.4)

## 2018-10-30 NOTE — Telephone Encounter (Signed)
Scheduled appt per 12/9 sch message pt is aware of appt date and time

## 2018-10-30 NOTE — Telephone Encounter (Signed)
Notified of message below. Verbalized understanding.   Message to scheduler.  

## 2018-10-30 NOTE — Telephone Encounter (Signed)
-----   Message from Maryanna Shape, NP sent at 10/30/2018  2:24 PM EST ----- Call pt. On labs, he appears dehydrated. Recommend 1 Liter of IVF tomorrow (please see where we can get this scheduled) and then repeat labs again on 12/12 and schedule another liter on NS on that day after labs (if needed).

## 2018-10-31 ENCOUNTER — Inpatient Hospital Stay: Payer: Medicare Other

## 2018-10-31 ENCOUNTER — Emergency Department (HOSPITAL_COMMUNITY): Payer: Medicare Other

## 2018-10-31 ENCOUNTER — Other Ambulatory Visit: Payer: Self-pay

## 2018-10-31 ENCOUNTER — Encounter (HOSPITAL_COMMUNITY): Payer: Self-pay | Admitting: Emergency Medicine

## 2018-10-31 ENCOUNTER — Inpatient Hospital Stay (HOSPITAL_COMMUNITY)
Admission: EM | Admit: 2018-10-31 | Discharge: 2018-11-08 | DRG: 193 | Discharge status: Against Medical Advice | Payer: Medicare Other | Attending: Internal Medicine | Admitting: Internal Medicine

## 2018-10-31 VITALS — BP 94/64 | HR 92 | Temp 98.8°F | Resp 18

## 2018-10-31 DIAGNOSIS — C3490 Malignant neoplasm of unspecified part of unspecified bronchus or lung: Secondary | ICD-10-CM | POA: Diagnosis present

## 2018-10-31 DIAGNOSIS — E119 Type 2 diabetes mellitus without complications: Secondary | ICD-10-CM

## 2018-10-31 DIAGNOSIS — D61810 Antineoplastic chemotherapy induced pancytopenia: Secondary | ICD-10-CM | POA: Diagnosis present

## 2018-10-31 DIAGNOSIS — I129 Hypertensive chronic kidney disease with stage 1 through stage 4 chronic kidney disease, or unspecified chronic kidney disease: Secondary | ICD-10-CM | POA: Diagnosis present

## 2018-10-31 DIAGNOSIS — J189 Pneumonia, unspecified organism: Principal | ICD-10-CM | POA: Diagnosis present

## 2018-10-31 DIAGNOSIS — R509 Fever, unspecified: Secondary | ICD-10-CM | POA: Diagnosis not present

## 2018-10-31 DIAGNOSIS — E43 Unspecified severe protein-calorie malnutrition: Secondary | ICD-10-CM | POA: Diagnosis present

## 2018-10-31 DIAGNOSIS — Z9861 Coronary angioplasty status: Secondary | ICD-10-CM

## 2018-10-31 DIAGNOSIS — Z8249 Family history of ischemic heart disease and other diseases of the circulatory system: Secondary | ICD-10-CM

## 2018-10-31 DIAGNOSIS — R5081 Fever presenting with conditions classified elsewhere: Secondary | ICD-10-CM | POA: Diagnosis present

## 2018-10-31 DIAGNOSIS — R599 Enlarged lymph nodes, unspecified: Secondary | ICD-10-CM | POA: Diagnosis present

## 2018-10-31 DIAGNOSIS — R4702 Dysphasia: Secondary | ICD-10-CM | POA: Diagnosis present

## 2018-10-31 DIAGNOSIS — N179 Acute kidney failure, unspecified: Secondary | ICD-10-CM | POA: Diagnosis present

## 2018-10-31 DIAGNOSIS — Z888 Allergy status to other drugs, medicaments and biological substances status: Secondary | ICD-10-CM

## 2018-10-31 DIAGNOSIS — Z7982 Long term (current) use of aspirin: Secondary | ICD-10-CM

## 2018-10-31 DIAGNOSIS — E785 Hyperlipidemia, unspecified: Secondary | ICD-10-CM | POA: Diagnosis present

## 2018-10-31 DIAGNOSIS — N183 Chronic kidney disease, stage 3 (moderate): Secondary | ICD-10-CM | POA: Diagnosis present

## 2018-10-31 DIAGNOSIS — R627 Adult failure to thrive: Secondary | ICD-10-CM | POA: Diagnosis present

## 2018-10-31 DIAGNOSIS — Z79899 Other long term (current) drug therapy: Secondary | ICD-10-CM

## 2018-10-31 DIAGNOSIS — N400 Enlarged prostate without lower urinary tract symptoms: Secondary | ICD-10-CM | POA: Diagnosis present

## 2018-10-31 DIAGNOSIS — Z923 Personal history of irradiation: Secondary | ICD-10-CM

## 2018-10-31 DIAGNOSIS — E1122 Type 2 diabetes mellitus with diabetic chronic kidney disease: Secondary | ICD-10-CM | POA: Diagnosis present

## 2018-10-31 DIAGNOSIS — Z955 Presence of coronary angioplasty implant and graft: Secondary | ICD-10-CM

## 2018-10-31 DIAGNOSIS — A084 Viral intestinal infection, unspecified: Secondary | ICD-10-CM | POA: Diagnosis present

## 2018-10-31 DIAGNOSIS — E860 Dehydration: Secondary | ICD-10-CM

## 2018-10-31 DIAGNOSIS — K222 Esophageal obstruction: Secondary | ICD-10-CM | POA: Diagnosis present

## 2018-10-31 DIAGNOSIS — I2510 Atherosclerotic heart disease of native coronary artery without angina pectoris: Secondary | ICD-10-CM

## 2018-10-31 DIAGNOSIS — J440 Chronic obstructive pulmonary disease with acute lower respiratory infection: Secondary | ICD-10-CM | POA: Diagnosis present

## 2018-10-31 DIAGNOSIS — Z7902 Long term (current) use of antithrombotics/antiplatelets: Secondary | ICD-10-CM

## 2018-10-31 DIAGNOSIS — R000 Tachycardia, unspecified: Secondary | ICD-10-CM | POA: Diagnosis not present

## 2018-10-31 DIAGNOSIS — Z95828 Presence of other vascular implants and grafts: Secondary | ICD-10-CM

## 2018-10-31 DIAGNOSIS — K208 Other esophagitis: Secondary | ICD-10-CM | POA: Diagnosis present

## 2018-10-31 DIAGNOSIS — T451X5A Adverse effect of antineoplastic and immunosuppressive drugs, initial encounter: Secondary | ICD-10-CM | POA: Diagnosis present

## 2018-10-31 DIAGNOSIS — Z833 Family history of diabetes mellitus: Secondary | ICD-10-CM

## 2018-10-31 DIAGNOSIS — D701 Agranulocytosis secondary to cancer chemotherapy: Secondary | ICD-10-CM

## 2018-10-31 DIAGNOSIS — D6959 Other secondary thrombocytopenia: Secondary | ICD-10-CM | POA: Diagnosis present

## 2018-10-31 DIAGNOSIS — K429 Umbilical hernia without obstruction or gangrene: Secondary | ICD-10-CM | POA: Diagnosis not present

## 2018-10-31 LAB — CBC WITH DIFFERENTIAL/PLATELET
ABS IMMATURE GRANULOCYTES: 0.1 10*3/uL — AB (ref 0.00–0.07)
Basophils Absolute: 0 10*3/uL (ref 0.0–0.1)
Basophils Relative: 1 %
Eosinophils Absolute: 0 10*3/uL (ref 0.0–0.5)
Eosinophils Relative: 0 %
HCT: 24.2 % — ABNORMAL LOW (ref 39.0–52.0)
Hemoglobin: 7.7 g/dL — ABNORMAL LOW (ref 13.0–17.0)
Immature Granulocytes: 5 %
Lymphocytes Relative: 4 %
Lymphs Abs: 0.1 10*3/uL — ABNORMAL LOW (ref 0.7–4.0)
MCH: 26.9 pg (ref 26.0–34.0)
MCHC: 31.8 g/dL (ref 30.0–36.0)
MCV: 84.6 fL (ref 80.0–100.0)
Monocytes Absolute: 0 10*3/uL — ABNORMAL LOW (ref 0.1–1.0)
Monocytes Relative: 1 %
NEUTROS ABS: 1.9 10*3/uL (ref 1.7–7.7)
Neutrophils Relative %: 89 %
Platelets: 119 10*3/uL — ABNORMAL LOW (ref 150–400)
RBC: 2.86 MIL/uL — ABNORMAL LOW (ref 4.22–5.81)
RDW: 13.3 % (ref 11.5–15.5)
WBC: 2.2 10*3/uL — ABNORMAL LOW (ref 4.0–10.5)
nRBC: 0 % (ref 0.0–0.2)

## 2018-10-31 LAB — COMPREHENSIVE METABOLIC PANEL
ALT: 12 U/L (ref 0–44)
AST: 15 U/L (ref 15–41)
Albumin: 3.1 g/dL — ABNORMAL LOW (ref 3.5–5.0)
Alkaline Phosphatase: 40 U/L (ref 38–126)
Anion gap: 13 (ref 5–15)
BUN: 44 mg/dL — ABNORMAL HIGH (ref 6–20)
CO2: 25 mmol/L (ref 22–32)
CREATININE: 2.17 mg/dL — AB (ref 0.61–1.24)
Calcium: 8.6 mg/dL — ABNORMAL LOW (ref 8.9–10.3)
Chloride: 106 mmol/L (ref 98–111)
GFR calc non Af Amer: 34 mL/min — ABNORMAL LOW (ref >60)
GFR, EST AFRICAN AMERICAN: 39 mL/min — AB (ref >60)
Glucose, Bld: 103 mg/dL — ABNORMAL HIGH (ref 70–99)
Potassium: 3.6 mmol/L (ref 3.5–5.1)
Sodium: 144 mmol/L (ref 135–145)
Total Bilirubin: 1 mg/dL (ref 0.3–1.2)
Total Protein: 6.5 g/dL (ref 6.5–8.1)

## 2018-10-31 LAB — I-STAT CG4 LACTIC ACID, ED: Lactic Acid, Venous: 0.75 mmol/L (ref 0.5–1.9)

## 2018-10-31 LAB — POC OCCULT BLOOD, ED: Fecal Occult Bld: NEGATIVE

## 2018-10-31 MED ORDER — FAMOTIDINE IN NACL 20-0.9 MG/50ML-% IV SOLN: 20.0000 mg | Freq: Two times a day (BID) | INTRAVENOUS | Status: DC

## 2018-10-31 MED ORDER — SODIUM CHLORIDE 0.9 % IV BOLUS
500.0000 mL | Freq: Once | INTRAVENOUS | Status: AC
  Administered 2018-10-31: 500 mL via INTRAVENOUS

## 2018-10-31 MED ORDER — SODIUM CHLORIDE 0.9 % IV SOLN
INTRAVENOUS | Status: DC
  Filled 2018-10-31: qty 250

## 2018-10-31 MED ORDER — SODIUM CHLORIDE 0.9% FLUSH
10.0000 mL | Freq: Once | INTRAVENOUS | Status: AC
  Administered 2018-10-31: 10 mL via INTRAVENOUS
  Filled 2018-10-31: qty 10

## 2018-10-31 MED ORDER — NYSTATIN 100000 UNIT/ML MT SUSP
5.0000 mL | Freq: Four times a day (QID) | OROMUCOSAL | Status: DC
  Administered 2018-11-01 – 2018-11-07 (×17): 500000 [IU] via ORAL
  Filled 2018-10-31 (×15): qty 5

## 2018-10-31 MED ORDER — SODIUM CHLORIDE 0.9 % IV BOLUS
1000.0000 mL | Freq: Once | INTRAVENOUS | Status: AC
  Administered 2018-10-31: 1000 mL via INTRAVENOUS

## 2018-10-31 MED ORDER — MORPHINE SULFATE (PF) 2 MG/ML IV SOLN
2.0000 mg | Freq: Once | INTRAVENOUS | Status: AC
  Administered 2018-10-31: 2 mg via INTRAVENOUS
  Filled 2018-10-31: qty 1

## 2018-10-31 MED ORDER — HEPARIN SOD (PORK) LOCK FLUSH 100 UNIT/ML IV SOLN
500.0000 [IU] | Freq: Once | INTRAVENOUS | Status: AC
  Administered 2018-10-31: 500 [IU] via INTRAVENOUS
  Filled 2018-10-31: qty 5

## 2018-10-31 MED ORDER — SODIUM CHLORIDE 0.9 % IV SOLN
INTRAVENOUS | Status: DC
  Administered 2018-10-31: 09:00:00 via INTRAVENOUS
  Filled 2018-10-31 (×2): qty 250

## 2018-10-31 NOTE — ED Triage Notes (Signed)
Pt reports having vomiting and abdominal pain for the last several days. Last radiation treatment for lung cancer was 10/30/18. Not currently on chemo.

## 2018-10-31 NOTE — ED Notes (Signed)
First set of blood cultures drawn from left hand by Di Kindle, RN

## 2018-10-31 NOTE — Patient Instructions (Signed)
Dehydration, Adult Dehydration is when there is not enough fluid or water in your body. This happens when you lose more fluids than you take in. Dehydration can range from mild to very bad. It should be treated right away to keep it from getting very bad. Symptoms of mild dehydration may include:  Thirst.  Dry lips.  Slightly dry mouth.  Dry, warm skin.  Dizziness. Symptoms of moderate dehydration may include:  Very dry mouth.  Muscle cramps.  Dark pee (urine). Pee may be the color of tea.  Your body making less pee.  Your eyes making fewer tears.  Heartbeat that is uneven or faster than normal (palpitations).  Headache.  Light-headedness, especially when you stand up from sitting.  Fainting (syncope). Symptoms of very bad dehydration may include:  Changes in skin, such as: ? Cold and clammy skin. ? Blotchy (mottled) or pale skin. ? Skin that does not quickly return to normal after being lightly pinched and let go (poor skin turgor).  Changes in body fluids, such as: ? Feeling very thirsty. ? Your eyes making fewer tears. ? Not sweating when body temperature is high, such as in hot weather. ? Your body making very little pee.  Changes in vital signs, such as: ? Weak pulse. ? Pulse that is more than 100 beats a minute when you are sitting still. ? Fast breathing. ? Low blood pressure.  Other changes, such as: ? Sunken eyes. ? Cold hands and feet. ? Confusion. ? Lack of energy (lethargy). ? Trouble waking up from sleep. ? Short-term weight loss. ? Unconsciousness. Follow these instructions at home:  If told by your doctor, drink an ORS: ? Make an ORS by using instructions on the package. ? Start by drinking small amounts, about  cup (120 mL) every 5-10 minutes. ? Slowly drink more until you have had the amount that your doctor said to have.  Drink enough clear fluid to keep your pee clear or pale yellow. If you were told to drink an ORS, finish the ORS  first, then start slowly drinking clear fluids. Drink fluids such as: ? Water. Do not drink only water by itself. Doing that can make the salt (sodium) level in your body get too low (hyponatremia). ? Ice chips. ? Fruit juice that you have added water to (diluted). ? Low-calorie sports drinks.  Avoid: ? Alcohol. ? Drinks that have a lot of sugar. These include high-calorie sports drinks, fruit juice that does not have water added, and soda. ? Caffeine. ? Foods that are greasy or have a lot of fat or sugar.  Take over-the-counter and prescription medicines only as told by your doctor.  Do not take salt tablets. Doing that can make the salt level in your body get too high (hypernatremia).  Eat foods that have minerals (electrolytes). Examples include bananas, oranges, potatoes, tomatoes, and spinach.  Keep all follow-up visits as told by your doctor. This is important. Contact a doctor if:  You have belly (abdominal) pain that: ? Gets worse. ? Stays in one area (localizes).  You have a rash.  You have a stiff neck.  You get angry or annoyed more easily than normal (irritability).  You are more sleepy than normal.  You have a harder time waking up than normal.  You feel: ? Weak. ? Dizzy. ? Very thirsty.  You have peed (urinated) only a small amount of very dark pee during 6-8 hours. Get help right away if:  You have symptoms of   very bad dehydration.  You cannot drink fluids without throwing up (vomiting).  Your symptoms get worse with treatment.  You have a fever.  You have a very bad headache.  You are throwing up or having watery poop (diarrhea) and it: ? Gets worse. ? Does not go away.  You have blood or something green (bile) in your throw-up.  You have blood in your poop (stool). This may cause poop to look black and tarry.  You have not peed in 6-8 hours.  You pass out (faint).  Your heart rate when you are sitting still is more than 100 beats a  minute.  You have trouble breathing. This information is not intended to replace advice given to you by your health care provider. Make sure you discuss any questions you have with your health care provider. Document Released: 09/04/2009 Document Revised: 05/28/2016 Document Reviewed: 01/02/2016 [employer] Interactive Patient Education  2018 [employer]  

## 2018-10-31 NOTE — H&P (Signed)
History and Physical    [name] BUL:[id number] DOB: Sep 19, 1966 DOA: 10/31/2018  PCP: [name], MD   Patient coming from: Home  I have personally briefly reviewed patient's old medical records in [place]  Chief Complaint: Vomiting, Abdominal pain   HPI: Andrew West is a 52 y.o. male with medical history significant for small cell lung cancer on chemo and radiation therapy, DM2, BPH, COPD, CAD, who presented to the ED with complaints of  Vomiting and right lower abdominal pain of 2 days duration.  Vomitus times yellow, sometimes with some blood.  Denies dysuria.  No fevers , reports chills.  Also reports a cough productive of yellowish sputum.  Denies shortness of breath no chest pain no lower extremity swelling, redness or pain. Spouse reports poor p.o. intake over the past 3 months, since starting chemotherapy.  And hospital admission 11/17- 11/20 for acute kidney injury, proved with IV fluids.  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   Today he reports some continued pain with swallowing.  ED Course: Initial tachycardia at 111, tachypnea to 28.  Sats greater than 94% on room air.  Creatinine elevated 2.1, from recent discharge creatinine of 1.3.  WBC low 2.2.  Hemoglobin low 7.7, platelets low 119.  Actiq acid normal 0.75.  FOBT negative.  Portable chest x-ray clear. UA -pending.  Cultures ordered pending.  1.5 L NS given in ED. Abd CT no acute intra-abdominal or No bowel obstruction no active inflammation.  Hospitalist to admit for failure to thrive.  Review of Systems: As per HPI   Past Medical History:  Diagnosis Date  . Anxiety   . CAD (coronary artery disease)    a. s/p multiple PCIs with last cath 11/2016 with severe multivessel CAD, s/p PCTA to LCx but unable to pass stent  . Chronic leg pain    bilateral  . Chronic lower back pain   . COPD  (chronic obstructive pulmonary disease) ([place])   . Depression   . GERD (gastroesophageal reflux disease)    Takes Dexilant  . HLD (hyperlipidemia)   . Hypertension   . Rhabdomyolysis    h/o, r/t statins  . Sleep apnea    "can't tolerate mask" (12/16/2016)  . Type II diabetes mellitus ([place])     Past Surgical History:  Procedure Laterality Date  . BACK SURGERY    . BRONCHIAL BIOPSY  08/25/2018   Procedure: BRONCHIAL BIOPSIES;  Surgeon: Garner Nash, DO;  Location: [facility];  Service: Cardiopulmonary;;  . CARDIAC CATHETERIZATION N/A 09/25/2015   Procedure: Left Heart Cath and Coronary Angiography;  Surgeon: Leonie Man, MD;  Location: [facility];  Service: Cardiovascular;  Laterality: N/A;  . CARDIAC CATHETERIZATION N/A 12/16/2016   Procedure: Left Heart Cath and Coronary Angiography;  Surgeon: Leonie Man, MD;  Location: [facility];  Service: Cardiovascular;  Laterality: N/A;  . CARDIAC CATHETERIZATION N/A 12/16/2016   Procedure: Coronary Balloon Angioplasty;  Surgeon: Leonie Man, MD;  Location: [facility];  Service: Cardiovascular;  Laterality: N/A;  . COLONOSCOPY W/ POLYPECTOMY    . CORONARY ANGIOPLASTY  09/25/2015   mid cir & om  . CORONARY ANGIOPLASTY WITH STENT PLACEMENT  10/09/2001   PTCA & stenting of mid AV circumflex; 2.5x73m Pixel stent  . CORONARY ANGIOPLASTY WITH STENT PLACEMENT  12/13/2001   PCI with stent to mid L circumflex, 95% stenosis to 0%  History and Physical    [name] BUL:[id number] DOB: Sep 19, 1966 DOA: 10/31/2018  PCP: [name], MD   Patient coming from: Home  I have personally briefly reviewed patient's old medical records in [place]  Chief Complaint: Vomiting, Abdominal pain   HPI: Andrew West is a 52 y.o. male with medical history significant for small cell lung cancer on chemo and radiation therapy, DM2, BPH, COPD, CAD, who presented to the ED with complaints of  Vomiting and right lower abdominal pain of 2 days duration.  Vomitus times yellow, sometimes with some blood.  Denies dysuria.  No fevers , reports chills.  Also reports a cough productive of yellowish sputum.  Denies shortness of breath no chest pain no lower extremity swelling, redness or pain. Spouse reports poor p.o. intake over the past 3 months, since starting chemotherapy.  And hospital admission 11/17- 11/20 for acute kidney injury, proved with IV fluids.  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   Today he reports some continued pain with swallowing.  ED Course: Initial tachycardia at 111, tachypnea to 28.  Sats greater than 94% on room air.  Creatinine elevated 2.1, from recent discharge creatinine of 1.3.  WBC low 2.2.  Hemoglobin low 7.7, platelets low 119.  Actiq acid normal 0.75.  FOBT negative.  Portable chest x-ray clear. UA -pending.  Cultures ordered pending.  1.5 L NS given in ED. Abd CT no acute intra-abdominal or No bowel obstruction no active inflammation.  Hospitalist to admit for failure to thrive.  Review of Systems: As per HPI   Past Medical History:  Diagnosis Date  . Anxiety   . CAD (coronary artery disease)    a. s/p multiple PCIs with last cath 11/2016 with severe multivessel CAD, s/p PCTA to LCx but unable to pass stent  . Chronic leg pain    bilateral  . Chronic lower back pain   . COPD  (chronic obstructive pulmonary disease) ([place])   . Depression   . GERD (gastroesophageal reflux disease)    Takes Dexilant  . HLD (hyperlipidemia)   . Hypertension   . Rhabdomyolysis    h/o, r/t statins  . Sleep apnea    "can't tolerate mask" (12/16/2016)  . Type II diabetes mellitus ([place])     Past Surgical History:  Procedure Laterality Date  . BACK SURGERY    . BRONCHIAL BIOPSY  08/25/2018   Procedure: BRONCHIAL BIOPSIES;  Surgeon: Garner Nash, DO;  Location: [facility];  Service: Cardiopulmonary;;  . CARDIAC CATHETERIZATION N/A 09/25/2015   Procedure: Left Heart Cath and Coronary Angiography;  Surgeon: Leonie Man, MD;  Location: [facility];  Service: Cardiovascular;  Laterality: N/A;  . CARDIAC CATHETERIZATION N/A 12/16/2016   Procedure: Left Heart Cath and Coronary Angiography;  Surgeon: Leonie Man, MD;  Location: [facility];  Service: Cardiovascular;  Laterality: N/A;  . CARDIAC CATHETERIZATION N/A 12/16/2016   Procedure: Coronary Balloon Angioplasty;  Surgeon: Leonie Man, MD;  Location: [facility];  Service: Cardiovascular;  Laterality: N/A;  . COLONOSCOPY W/ POLYPECTOMY    . CORONARY ANGIOPLASTY  09/25/2015   mid cir & om  . CORONARY ANGIOPLASTY WITH STENT PLACEMENT  10/09/2001   PTCA & stenting of mid AV circumflex; 2.5x73m Pixel stent  . CORONARY ANGIOPLASTY WITH STENT PLACEMENT  12/13/2001   PCI with stent to mid L circumflex, 95% stenosis to 0%  ACCU-CHEK AVIVA PLUS) w/Device KIT Use as dircted 07/12/17  Yes Newlin, Odette Horns, MD  Cholecalciferol (VITAMIN D) 2000 units tablet Take 1 tablet (2,000 Units total) by mouth daily. 08/04/18  Yes Marcello Fennel, MD  clopidogrel (PLAVIX) 75 MG tablet Take 1 tablet (75 mg total) by mouth daily. YOU MAY RESTART 08/26/2018 08/25/18  Yes Icard, Bradley L, DO  diclofenac sodium (VOLTAREN) 1 % GEL Apply 2 g topically 4 (four) times daily. Patient taking differently: Apply 2 g topically 4 (four) times daily as needed (pain).  04/20/18  Yes [name], MD  dronabinol (MARINOL) 5 MG capsule Take 1 capsule (5 mg total) by mouth 2 (two) times daily before a meal. 10/18/18  Yes Margaretmary Dys, MD  FLUoxetine (PROZAC) 40 MG capsule Take 1 capsule (40 mg total) by mouth daily. 04/20/18  Yes Newlin, Odette Horns, MD  fluticasone (FLONASE) 50 MCG/ACT nasal spray Place 2 sprays into both nostrils daily as needed for allergies. 10/11/18  Yes Glade Lloyd, MD  Fluticasone-Salmeterol  (ADVAIR) 100-50 MCG/DOSE AEPB Inhale 1 puff into the lungs 2 (two) times daily. 07/25/18  Yes [name], MD  glucose blood (ACCU-CHEK AVIVA) test strip Use as instructed 07/12/17  Yes Newlin, Enobong, MD  hydrALAZINE (APRESOLINE) 25 MG tablet Take 1 tablet (25 mg total) by mouth 2 (two) times daily. 04/20/18  Yes [name], MD  HYDROcodone-acetaminophen (NORCO) 5-325 MG tablet Take 1-2 tablets by mouth every 6 (six) hours as needed for moderate pain. 10/17/18  Yes Bruning, Ashlyn, PA-C  hydrocortisone-pramoxine (ANALPRAM-HC) 2.5-1 % rectal cream Place 1 application rectally as needed. Patient taking differently: Place 1 application rectally as needed for hemorrhoids or anal itching.  10/11/18  Yes Glade Lloyd, MD  isosorbide mononitrate (IMDUR) 120 MG 24 hr tablet Take 1 tablet (120 mg total) by mouth daily. 04/20/18  Yes [name], MD  lidocaine-prilocaine (EMLA) cream Apply 1 application topically as needed. Apply 1 tsp on skin over port site one hour prior to chemotherapy. Cover with plastic wrap. Do not rub in medication. 10/03/18  Yes Si Gaul, MD  LORazepam (ATIVAN) 0.5 MG tablet 1 tab po q 4-6 hours prn or 1 tab po 30 minutes prior to radiation 08/30/18  Yes Ronny Bacon, PA-C  mirtazapine (REMERON) 30 MG tablet Take 1 tablet (30 mg total) by mouth at bedtime. 08/31/18  Yes Si Gaul, MD  Nebivolol HCl 20 MG TABS Take 1 tablet (20 mg total) by mouth daily. 01/26/18  Yes Hilty, Lisette Abu, MD  nicotine (NICODERM CQ) 21 mg/24hr patch Place 1 patch (21 mg total) onto the skin daily. 08/31/18  Yes Si Gaul, MD  nitroGLYCERIN (NITROSTAT) 0.4 MG SL tablet Place 1 tablet (0.4 mg total) under the tongue every 5 (five) minutes as needed for chest pain. 12/24/16  Yes Strader, [place] M, PA-C  ondansetron (ZOFRAN) 8 MG tablet Take 1 tablet (8 mg total) by mouth every 8 (eight) hours as needed for nausea or vomiting. 09/20/18  Yes Si Gaul, MD    pantoprazole (PROTONIX) 40 MG tablet Take 1 tablet (40 mg total) by mouth daily. 10/11/18  Yes Glade Lloyd, MD  prazosin (MINIPRESS) 1 MG capsule Take 1 capsule (1 mg total) by mouth at bedtime. For nightmares 07/25/18  Yes [name], MD  prochlorperazine (COMPAZINE) 10 MG tablet Take 1 tablet (10 mg total) by mouth every 6 (six) hours as needed for nausea or vomiting. 10/23/18  Yes Si Gaul, MD  promethazine (PHENERGAN) 25 MG tablet Take 1 tablet (  25 mg total) by mouth every 6 (six) hours as needed for nausea or vomiting. 10/22/18  Yes Melene Plan, DO  ranolazine (RANEXA) 1000 MG SR tablet Take 1 tablet (1,000 mg total) by mouth 2 (two) times daily. 05/24/17  Yes Hilty, Lisette Abu, MD  rosuvastatin (CRESTOR) 20 MG tablet Take 1 tablet (20 mg total) by mouth daily. 07/26/18  Yes [name], MD  sucralfate (CARAFATE) 1 g tablet Take 1 tablet by mouth 4 (four) times daily -  before meals and at bedtime. 09/23/18  Yes [provider]  tiotropium (SPIRIVA HANDIHALER) 18 MCG inhalation capsule Place 1 capsule (18 mcg total) into inhaler and inhale daily. 04/20/18  Yes [name], MD  vitamin B-12 (CYANOCOBALAMIN) 500 MCG tablet Take 1 tablet (500 mcg total) by mouth daily. 06/30/18  Yes Marcello Fennel, MD  zolpidem (AMBIEN) 5 MG tablet Take 1 tablet (5 mg total) by mouth at bedtime as needed for sleep. 09/14/18  Yes [name], MD    Physical Exam: Vitals:   10/31/18 2100 10/31/18 2105 10/31/18 2230 10/31/18 2259  BP: 107/82 107/82 111/73 123/79  Pulse: (!) 102 99 93 93  Resp:  (!) 24 (!) 26 (!) 25  Temp:      TempSrc:      SpO2: 100% 100% 96% 99%  Weight:      Height:        Constitutional: NAD, calm, comfortable Vitals:   10/31/18 2100 10/31/18 2105 10/31/18 2230 10/31/18 2259  BP: 107/82 107/82 111/73 123/79  Pulse: (!) 102 99 93 93  Resp:  (!) 24 (!) 26 (!) 25  Temp:      TempSrc:      SpO2: 100% 100% 96% 99%  Weight:      Height:       Eyes: PERRL,  lids and conjunctivae normal ENMT: Mucous membranes are moist. Posterior pharynx clear of any exudate or lesions. Whitish coating on tongue- patient reports goes away with brushing Neck: normal, supple, no masses, no thyromegaly Respiratory: clear to auscultation bilaterally, no wheezing, no crackles. Normal respiratory effort. No accessory muscle use.  Cardiovascular: Regular rate and rhythm, no murmurs / rubs / gallops. No extremity edema. 2+ pedal pulses. No carotid bruits.  Abdomen:  Moderate right lower abdominal tenderness, no masses palpated. No hepatosplenomegaly. Bowel sounds positive.  Musculoskeletal: no clubbing / cyanosis. No joint deformity upper and lower extremities. Good ROM, no contractures. Normal muscle tone.  Skin: no rashes, lesions, ulcers. No induration Neurologic: CN 2-12 grossly intact. Strength 5/5 in all 4.  Psychiatric: Normal judgment and insight. Alert and oriented x 3. Normal mood.   Labs on Admission: I have personally reviewed following labs and imaging studies  CBC: Recent Labs  Lab 10/30/18 0740 10/31/18 2028  WBC 3.9* 2.2*  NEUTROABS 3.7 1.9  HGB 9.3* 7.7*  HCT 28.5* 24.2*  MCV 81.7 84.6  PLT 203 119*   Basic Metabolic Panel: Recent Labs  Lab 10/30/18 0740 10/31/18 2028  NA 144 144  K 3.7 3.6  CL 104 106  CO2 26 25  GLUCOSE 100* 103*  BUN 37* 44*  CREATININE 2.21* 2.17*  CALCIUM 9.3 8.6*  MG 1.8  --    Liver Function Tests: Recent Labs  Lab 10/30/18 0740 10/31/18 2028  AST 13* 15  ALT 9 12  ALKPHOS 52 40  BILITOT 0.7 1.0  PROT 6.8 6.5  ALBUMIN 3.1* 3.1*    Radiological Exams on Admission: Dg Chest [facility] 1 [address]  Result Date: 10/31/2018 CLINICAL DATA:  History of lung cancer, chills EXAM: PORTABLE CHEST 1 VIEW COMPARISON:  10/22/2018, 10/08/2018, CT chest 10/20/2018 FINDINGS: Left-sided central venous port tip over the SVC. No acute consolidation or effusion. Stable cardiomediastinal silhouette. No pneumothorax. IMPRESSION: No  active disease. Electronically Signed   By: Jasmine Pang M.D.   On: 10/31/2018 21:09    EKG: Independently reviewed.  EKG unchanged from prior.  QTc 463.  Rate 101.  Assessment/Plan Active Problems:   AKI (acute kidney injury) (H[facility])  Acute kidney injury- Cr  2.1, BUN 44, gradual uptrend from recent discharge creatinine- 1.3.  Likely prerenal from poor p.o. Intake, vomiting.  Sodium 144.  Normal lactic acid 0.75. - 1/2 N/s + 20 KCL 125cc/hr x 15 hrs  Limited stage small cell lung cancer, failure to thrive- follows with Dr. Shirline Frees, recent weight loss of ~ 25 pounds in the past 2 months.  Currently on chemotherapy and radiation therapy. Marinol, mirtazepine has not helped with appetite. -Nutrition consult -Placement boost 3 times daily -Family is open to the idea of PEG tube, Consider palliative and oncology consult a.m. -Continue home Marinol Prozac  Abdominal pain-vomiting, lower quadrant.  CT abdomen and pelvis without contrast no acute abnormality.  WBC low 2.2.  Lactic acid 0.75.  FOBT negative. ?  Viral gastro-enteritis, versus chemotherapy related. -Follow-up UA -Continue home Protonix -Morphine 2mg  as needed  Pancytopenia-likely from chemotherapy.  WBC-2.2, hemoglobin 7.7, plt 119.  DM 2-glucose 103 - SSI -Regular diet for now, with poor po intake  COPD, Asthma- Stable -Continue home regimen.  CAD-  Hx of multiple stents and angioplasty .  No chest pain.  EKG without changes. -Continue home Plavix and aspirin, ranolazine, nebivolol, imdur  HTN-systolic 107-123. - Cont imdur, prazosin, nebivolol, hold hydralazine and norvasc, ?compliance.  DVT prophylaxis: Heparin Code Status: Full Family Communication: Spouse at bedside Disposition Plan: Per rounding team Consults called: Consider palliative and oncology consult, ?patient ,may benfit from PEG tube. Admission status: Obs, med-surg   Onnie Boer MD [facility] Pager [phone] From  6PM-2AM.  Otherwise please contact night-coverage [email or web address] Password [email or web address]  10/31/2018, 12:18 AM

## 2018-10-31 NOTE — ED Notes (Signed)
Dr.Ray at bedside at this time.

## 2018-10-31 NOTE — ED Provider Notes (Addendum)
[facility] DEPT Provider Note   CSN: [id number] Arrival date & time: 10/31/18  1939      History   Chief Complaint Chief Complaint  Patient presents with  . Emesis  . Cancer Pt    HPI Andrew West is a 52 y.o. male.  HPI 52 year old male with stage III small cell lung cancer on chemotherapy and radiation presents today with nausea vomiting, abdominal pain. He reports emesis with yellowish substance and bloody colored but no gross hematemesis.  Denies fever but is having some chills here.  He has been having ongoing cough productive of some yellowish sputum.  He reports taking his usual home medications.  He denies any other interventions.  He had his last labs drawn yesterday.  He has a Port-A-Cath in his left chest and denies any significant problems with his Port-A-Cath. Past Medical History:  Diagnosis Date  . Anxiety   . CAD (coronary artery disease)    a. s/p multiple PCIs with last cath 11/2016 with severe multivessel CAD, s/p PCTA to LCx but unable to pass stent  . Chronic leg pain    bilateral  . Chronic lower back pain   . COPD (chronic obstructive pulmonary disease) ([place])   . Depression   . GERD (gastroesophageal reflux disease)    Takes Dexilant  . HLD (hyperlipidemia)   . Hypertension   . Rhabdomyolysis    h/o, r/t statins  . Sleep apnea    "can't tolerate mask" (12/16/2016)  . Type II diabetes mellitus [facility])     Patient Active Problem List   Diagnosis Date Noted  . Odynophagia   . AKI (acute kidney injury) ([place]) 10/08/2018  . Dysphagia 10/08/2018  . Hypokalemia 10/08/2018  . Hypomagnesemia 10/08/2018  . Type 2 diabetes mellitus ([place]) 10/08/2018  . Anemia 10/08/2018  . Sleep apnea 10/08/2018  . 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[place]) 08/31/2018  . Goals of care, counseling/discussion 08/31/2018  . Encounter for antineoplastic chemotherapy 08/31/2018  . Encounter for smoking cessation counseling  08/31/2018  . Malignant neoplasm of bronchus of right upper lobe ([place]) 08/29/2018  . Mediastinal mass 08/17/2018  . Vitamin D deficiency 08/04/2018  . Diabetic peripheral neuropathy ([place]) 06/30/2018  . Substernal chest pain 02/25/2018  . Benign prostatic hyperplasia with urinary hesitancy 11/17/2017  . Preoperative cardiovascular examination 07/12/2017  . Refractory angina ([place]) 05/24/2017  . Complex regional pain syndrome type I 03/24/2017  . Progressive angina ([place]) -Class III 12/16/2016  . Anxiety 06/28/2016  . Diastolic dysfunction-grade 2 with EF 60-65% Oct 2016 03/22/2016  . Hypertension 10/03/2015  . CAD S/P multiple PCI's 10/03/2015  . Pulmonary nodule 06/24/2015  . Cough 06/24/2015  . COPD with asthma ([place]) 06/24/2015  . Reactive airway disease 06/04/2015  . DOE (dyspnea on exertion) 04/15/2015  . Lumbar disc herniation with radiculopathy 03/31/2012  . Presence of stent in left circumflex coronary artery 10/18/2011    Class: History of  . TOBACCO ABUSE 02/27/2009  . ABDOMINAL PAIN, LEFT LOWER QUADRANT 12/30/2008  . ABDOMINAL PAIN, EPIGASTRIC 12/05/2008  . Anxiety and depression 09/05/2008  . Hereditary and idiopathic peripheral neuropathy 09/05/2008  . GERD 09/05/2008  . Cervical disc disorder with radiculopathy of cervical region 08/19/2008  . HLD (hyperlipidemia) 04/25/2008    Class: Diagnosis of  . ALLERGIC RHINITIS 04/25/2008  . Backache 04/25/2008  . Chest pain, unspecified 04/25/2008  . COLONIC POLYPS, HX OF 04/25/2008  . Obesity 04/18/2008    Class: Diagnosis of  .  ANAL FISSURE, HX OF 04/18/2008  . Diabetes mellitus ([place]) 01/30/2008    Class: History of  . RECTAL BLEEDING 01/30/2008    Past Surgical History:  Procedure Laterality Date  . BACK SURGERY    . BRONCHIAL BIOPSY  08/25/2018   Procedure: BRONCHIAL BIOPSIES;  Surgeon: Garner Nash, DO;  Location: [facility];  Service: Cardiopulmonary;;  . CARDIAC CATHETERIZATION N/A 09/25/2015   Procedure:  Left Heart Cath and Coronary Angiography;  Surgeon: Leonie Man, MD;  Location: [facility] LAB;  Service: Cardiovascular;  Laterality: N/A;  . CARDIAC CATHETERIZATION N/A 12/16/2016   Procedure: Left Heart Cath and Coronary Angiography;  Surgeon: Leonie Man, MD;  Location: [facility] CV LAB;  Service: Cardiovascular;  Laterality: N/A;  . CARDIAC CATHETERIZATION N/A 12/16/2016   Procedure: Coronary Balloon Angioplasty;  Surgeon: Leonie Man, MD;  Location: [facility] CV LAB;  Service: Cardiovascular;  Laterality: N/A;  . COLONOSCOPY W/ POLYPECTOMY    . CORONARY ANGIOPLASTY  09/25/2015   mid cir & om  . CORONARY ANGIOPLASTY WITH STENT PLACEMENT  10/09/2001   PTCA & stenting of mid AV circumflex; 2.5x104m Pixel stent  . CORONARY ANGIOPLASTY WITH STENT PLACEMENT  12/13/2001   PCI with stent to mid L circumflex, 95% stenosis to 0% residual  . CORONARY ANGIOPLASTY WITH STENT PLACEMENT  10/10/2003   PCI to mid AV circumflex; LAD 30% disease; RCA 100% occluded prox.  . CORONARY ANGIOPLASTY WITH STENT PLACEMENT  09/01/2011   PCI with stenting with bare metal stent to mid AV groove circumflex and PDA  . CORONARY ANGIOPLASTY WITH STENT PLACEMENT  10/17/2011   cutting balloon angioplasty of ostial lateral OM1 branch and bifurcation AV groove circumflex OM junction; stenosis reduced to 0%  . ENDOBRONCHIAL ULTRASOUND Bilateral 08/25/2018   Procedure: ENDOBRONCHIAL ULTRASOUND;  Surgeon: IGarner Nash DO;  Location: [facility];  Service: Cardiopulmonary;  Laterality: Bilateral;  . EXCISIONAL HEMORRHOIDECTOMY    . FINE NEEDLE ASPIRATION  08/25/2018   Procedure: FINE NEEDLE ASPIRATION;  Surgeon: IGarner Nash DO;  Location: [facility];  Service: Cardiopulmonary;;  . FLEXIBLE BRONCHOSCOPY  08/25/2018   Procedure: FLEXIBLE BRONCHOSCOPY;  Surgeon: IGarner Nash DO;  Location: [facility];  Service: Cardiopulmonary;;  . IR IMAGING GUIDED PORT INSERTION  09/29/2018  . LEFT HEART  CATHETERIZATION WITH CORONARY ANGIOGRAM N/A 10/18/2011   Procedure: LEFT HEART CATHETERIZATION WITH CORONARY ANGIOGRAM;  Surgeon: DLeonie Man MD;  [facility];  Service: Cardiovascular;  Laterality: N/A;  . LUMBAR LAMINECTOMY/DECOMPRESSION MICRODISCECTOMY  03/31/2012   Procedure: LUMBAR LAMINECTOMY/DECOMPRESSION MICRODISCECTOMY 1 LEVEL;  Surgeon: HCharlie Pitter MD;  [facility];  Service: Neurosurgery;  Laterality: Left;  . TRANSTHORACIC ECHOCARDIOGRAM  07/28/2011   EF 55-65%; LVH, grade 1 diastolic dysfunction;         Home Medications    Prior to Admission medications   Medication Sig Start Date End Date Taking? Authorizing Provider  albuterol (PROAIR HFA) 108 (90 Base) MCG/ACT inhaler Inhale 1 puff into the lungs every 6 (six) hours as needed for wheezing or shortness of breath. 08/29/18   NCharlott Rakes MD  amLODipine (NORVASC) 10 MG tablet Take 1 tablet (10 mg total) by mouth daily. 04/20/18   NCharlott Rakes MD  aspirin 81 MG tablet Take 1 tablet (81 mg total) daily by mouth. 10/10/17   NCharlott Rakes MD  baclofen (LIORESAL) 10 MG tablet Take 1 tablet (10 mg total) by mouth 3 (three) times daily as needed for muscle  spasms. 10/11/18   Aline August, MD  benzonatate (TESSALON) 100 MG capsule Take 1 capsule (100 mg total) by mouth every 8 (eight) hours. Patient taking differently: Take 100 mg by mouth 3 (three) times daily as needed for cough.  09/19/18   Curt Bears, MD  Blood Glucose Monitoring Suppl (ACCU-CHEK AVIVA PLUS) w/Device KIT Use as dircted 07/12/17   [name], MD  Cholecalciferol (VITAMIN D) 2000 units tablet Take 1 tablet (2,000 Units total) by mouth daily. 08/04/18   Jamse Arn, MD  clopidogrel (PLAVIX) 75 MG tablet Take 1 tablet (75 mg total) by mouth daily. YOU MAY RESTART 08/26/2018 08/25/18   Icard, Leory Plowman L, DO  diclofenac sodium (VOLTAREN) 1 % GEL Apply 2 g topically 4 (four) times daily. Patient taking differently: Apply 2 g  topically 4 (four) times daily as needed (pain).  04/20/18   [name], MD  dronabinol (MARINOL) 5 MG capsule Take 1 capsule (5 mg total) by mouth 2 (two) times daily before a meal. 10/18/18   Tyler Pita, MD  FLUoxetine (PROZAC) 40 MG capsule Take 1 capsule (40 mg total) by mouth daily. 04/20/18   [name], MD  fluticasone (FLONASE) 50 MCG/ACT nasal spray Place 2 sprays into both nostrils daily as needed for allergies. 10/11/18   Aline August, MD  Fluticasone-Salmeterol (ADVAIR) 100-50 MCG/DOSE AEPB Inhale 1 puff into the lungs 2 (two) times daily. 07/25/18   [name], MD  glucose blood (ACCU-CHEK AVIVA) test strip Use as instructed 07/12/17   [name], MD  hydrALAZINE (APRESOLINE) 25 MG tablet Take 1 tablet (25 mg total) by mouth 2 (two) times daily. 04/20/18   [name], MD  HYDROcodone-acetaminophen (NORCO) 5-325 MG tablet Take 1-2 tablets by mouth every 6 (six) hours as needed for moderate pain. 10/17/18   Bruning, Ashlyn, PA-C  hydrocortisone-pramoxine (ANALPRAM-HC) 2.5-1 % rectal cream Place 1 application rectally as needed. Patient taking differently: Place 1 application rectally as needed for hemorrhoids or anal itching.  10/11/18   Aline August, MD  isosorbide mononitrate (IMDUR) 120 MG 24 hr tablet Take 1 tablet (120 mg total) by mouth daily. 04/20/18   [name], MD  lidocaine-prilocaine (EMLA) cream Apply 1 application topically as needed. Apply 1 tsp on skin over port site one hour prior to chemotherapy. Cover with plastic wrap. Do not rub in medication. 10/03/18   Curt Bears, MD  LORazepam (ATIVAN) 0.5 MG tablet 1 tab po q 4-6 hours prn or 1 tab po 30 minutes prior to radiation 08/30/18   Hayden Pedro, PA-C  magic mouthwash SOLN Take 10 mLs by mouth 3 (three) times daily as needed for mouth pain. Patient not taking: Reported on 10/08/2018 09/24/18   Rodell Perna A, PA-C  mirtazapine (REMERON) 30 MG tablet Take 1 tablet (30 mg total)  by mouth at bedtime. 08/31/18   Curt Bears, MD  Nebivolol HCl 20 MG TABS Take 1 tablet (20 mg total) by mouth daily. 01/26/18   Hilty, Nadean Corwin, MD  nicotine (NICODERM CQ) 21 mg/24hr patch Place 1 patch (21 mg total) onto the skin daily. 08/31/18   Curt Bears, MD  nitroGLYCERIN (NITROSTAT) 0.4 MG SL tablet Place 1 tablet (0.4 mg total) under the tongue every 5 (five) minutes as needed for chest pain. 12/24/16   Ahmed Prima, Fransisco Hertz, PA-C  ondansetron (ZOFRAN) 8 MG tablet Take 1 tablet (8 mg total) by mouth every 8 (eight) hours as needed for nausea or vomiting. 09/20/18   Curt Bears, MD  pantoprazole (PROTONIX) 40 MG tablet Take 1 tablet (40 mg total) by mouth daily. 10/11/18   Aline August, MD  prazosin (MINIPRESS) 1 MG capsule Take 1 capsule (1 mg total) by mouth at bedtime. For nightmares 07/25/18   [name], MD  prochlorperazine (COMPAZINE) 10 MG tablet Take 1 tablet (10 mg total) by mouth every 6 (six) hours as needed for nausea or vomiting. 10/23/18   Curt Bears, MD  promethazine (PHENERGAN) 25 MG tablet Take 1 tablet (25 mg total) by mouth every 6 (six) hours as needed for nausea or vomiting. 10/22/18   Deno Etienne, DO  ranolazine (RANEXA) 1000 MG SR tablet Take 1 tablet (1,000 mg total) by mouth 2 (two) times daily. 05/24/17   Hilty, Nadean Corwin, MD  rosuvastatin (CRESTOR) 20 MG tablet Take 1 tablet (20 mg total) by mouth daily. 07/26/18   [name], MD  sucralfate (CARAFATE) 1 g tablet Take 1 tablet by mouth 4 (four) times daily -  before meals and at bedtime. 09/23/18   [provider]  tiotropium (SPIRIVA HANDIHALER) 18 MCG inhalation capsule Place 1 capsule (18 mcg total) into inhaler and inhale daily. 04/20/18   [name], MD  vitamin B-12 (CYANOCOBALAMIN) 500 MCG tablet Take 1 tablet (500 mcg total) by mouth daily. 06/30/18   Jamse Arn, MD  zolpidem (AMBIEN) 5 MG tablet Take 1 tablet (5 mg total) by mouth at bedtime as needed for sleep. 09/14/18    [name], MD    Family History Family History  Problem Relation Age of Onset  . Heart attack Father   . Hypertension Mother   . Diabetes Mother   . Heart disease Brother        x 3   . Heart attack Brother        deceased  . Hypertension Sister   . Diabetes Sister   . Anesthesia problems Neg Hx   . Hypotension Neg Hx   . Malignant hyperthermia Neg Hx   . Pseudochol deficiency Neg Hx     Social History Social History   Tobacco Use  . Smoking status: Former Smoker    Packs/day: 0.25    Years: 25.00    Pack years: 6.25    Types: Cigarettes    Last attempt to quit: 05/24/2015    Years since quitting: 3.4  . Smokeless tobacco: Never Used  Substance Use Topics  . Alcohol use: No    Alcohol/week: 0.0 standard drinks  . Drug use: No     Allergies   Iohexol   Review of Systems Review of Systems  Constitutional: Positive for activity change, appetite change, chills and fatigue.  HENT: Negative.   Eyes: Negative.   Respiratory: Positive for cough and shortness of breath.   Cardiovascular: Negative.   Gastrointestinal: Positive for abdominal pain.  Endocrine: Negative.   Genitourinary: Negative.   Musculoskeletal: Negative.   Allergic/Immunologic: Negative.   Neurological: Positive for light-headedness.  Hematological: Negative.   Psychiatric/Behavioral: Negative.   All other systems reviewed and are negative.    Physical Exam Updated Vital Signs BP 107/82   Pulse 99   Temp 99.4 F (37.4 C) (Rectal)   Resp (!) 24   Ht 1.753 m (_0 )   Wt 95 kg   SpO2 100%   BMI 30.93 kg/m   Physical Exam  Constitutional: He is oriented to person, place, and time. He appears well-developed and well-nourished.  Patient is cold  HENT:  Head: Normocephalic.  Right Ear: External  ear normal.  Left Ear: External ear normal.  Mucous membranes are dry  Eyes: Pupils are equal, round, and reactive to light. EOM are normal.  Neck: Normal range of motion. Neck  supple.  Cardiovascular: Normal rate and regular rhythm.  Pulmonary/Chest: Effort normal and breath sounds normal. No respiratory distress. He has no wheezes.  Abdominal: Soft. There is tenderness.  Mild tenderness palpation right lower quadrant  Musculoskeletal: Normal range of motion.  Neurological: He is alert and oriented to person, place, and time. He displays normal reflexes. No cranial nerve deficit. Coordination normal.  Skin: Skin is warm and dry. Capillary refill takes less than 2 seconds.  Psychiatric: He has a normal mood and affect.  Nursing note and vitals reviewed.    ED Treatments / Results  Labs (all labs ordered are listed, but only abnormal results are displayed) Labs Reviewed  CBC WITH DIFFERENTIAL/PLATELET - Abnormal; Notable for the following components:      Result Value   WBC 2.2 (*)    RBC 2.86 (*)    Hemoglobin 7.7 (*)    HCT 24.2 (*)    Platelets 119 (*)    Lymphs Abs 0.1 (*)    Monocytes Absolute 0.0 (*)    Abs Immature Granulocytes 0.10 (*)    All other components within normal limits  COMPREHENSIVE METABOLIC PANEL - Abnormal; Notable for the following components:   Glucose, Bld 103 (*)    BUN 44 (*)    Creatinine, Ser 2.17 (*)    Calcium 8.6 (*)    Albumin 3.1 (*)    GFR calc non Af Amer 34 (*)    GFR calc Af Amer 39 (*)    All other components within normal limits  CULTURE, BLOOD (ROUTINE X 2)  CULTURE, BLOOD (ROUTINE X 2)  URINALYSIS, ROUTINE W REFLEX MICROSCOPIC  I-STAT CG4 LACTIC ACID, ED    EKG EKG Interpretation  Date/Time:  Tuesday October 31 2018 20:15:20 EST Ventricular Rate:  101 PR Interval:    QRS Duration: 110 QT Interval:  357 QTC Calculation: 463 R Axis:   64 Text Interpretation:  Sinus tachycardia Confirmed by Pattricia Boss [id number]) on 10/31/2018 9:44:02 PM   Radiology Dg Chest Port 1 View  Result Date: 10/31/2018 CLINICAL DATA:  History of lung cancer, chills EXAM: PORTABLE CHEST 1 VIEW COMPARISON:  10/22/2018,  10/08/2018, CT chest 10/20/2018 FINDINGS: Left-sided central venous port tip over the SVC. No acute consolidation or effusion. Stable cardiomediastinal silhouette. No pneumothorax. IMPRESSION: No active disease. Electronically Signed   By: Donavan Foil M.D.   On: 10/31/2018 21:09    Procedures Procedures (including critical care time)  Medications Ordered in ED Medications  sodium chloride 0.9 % bolus 1,000 mL (has no administration in time range)     Initial Impression / Assessment and Plan / ED Course  I have reviewed the triage vital signs and the nursing notes.  Pertinent labs & imaging results that were available during my care of the patient were reviewed by me and considered in my medical decision making (see chart for details).     1-nausea and vomiting-  2- renal insufficiency-secondarty to 1 3- pancytopenia- likely due to chemo, cultures obtained,anc 1958, afebrile, no abx given 4-abdominal pain- ct with no acute intraabdominal changes  Plan admission for hydration Monitoring of renal function Trend cbc- patient does not appear to need tx at this time Vitals:   10/31/18 2100 10/31/18 2105  BP: 107/82 107/82  Pulse: (!) 102 99  Resp: (!) 3 (!) 24  Temp:    SpO2: 100% 100%    Final Clinical Impressions(s) / ED Diagnoses   Final diagnoses:  Fever    ED Discharge Orders    None       Pattricia Boss, MD 11/02/18 Jamal Maes    Pattricia Boss, MD 11/02/18 1721

## 2018-10-31 NOTE — ED Notes (Signed)
Bed: GF84 Expected date:  Expected time:  Means of arrival:  Comments: [name]

## 2018-11-01 DIAGNOSIS — A084 Viral intestinal infection, unspecified: Secondary | ICD-10-CM | POA: Diagnosis present

## 2018-11-01 DIAGNOSIS — D61810 Antineoplastic chemotherapy induced pancytopenia: Secondary | ICD-10-CM | POA: Diagnosis present

## 2018-11-01 DIAGNOSIS — E43 Unspecified severe protein-calorie malnutrition: Secondary | ICD-10-CM | POA: Diagnosis present

## 2018-11-01 DIAGNOSIS — T451X5A Adverse effect of antineoplastic and immunosuppressive drugs, initial encounter: Secondary | ICD-10-CM | POA: Diagnosis present

## 2018-11-01 DIAGNOSIS — D61811 Other drug-induced pancytopenia: Secondary | ICD-10-CM | POA: Diagnosis not present

## 2018-11-01 DIAGNOSIS — D6959 Other secondary thrombocytopenia: Secondary | ICD-10-CM | POA: Diagnosis present

## 2018-11-01 DIAGNOSIS — I2510 Atherosclerotic heart disease of native coronary artery without angina pectoris: Secondary | ICD-10-CM | POA: Diagnosis present

## 2018-11-01 DIAGNOSIS — D709 Neutropenia, unspecified: Secondary | ICD-10-CM | POA: Diagnosis not present

## 2018-11-01 DIAGNOSIS — N400 Enlarged prostate without lower urinary tract symptoms: Secondary | ICD-10-CM | POA: Diagnosis present

## 2018-11-01 DIAGNOSIS — C3490 Malignant neoplasm of unspecified part of unspecified bronchus or lung: Secondary | ICD-10-CM | POA: Diagnosis present

## 2018-11-01 DIAGNOSIS — D759 Disease of blood and blood-forming organs, unspecified: Secondary | ICD-10-CM | POA: Diagnosis not present

## 2018-11-01 DIAGNOSIS — D696 Thrombocytopenia, unspecified: Secondary | ICD-10-CM | POA: Diagnosis not present

## 2018-11-01 DIAGNOSIS — N179 Acute kidney failure, unspecified: Secondary | ICD-10-CM | POA: Diagnosis not present

## 2018-11-01 DIAGNOSIS — J449 Chronic obstructive pulmonary disease, unspecified: Secondary | ICD-10-CM | POA: Diagnosis not present

## 2018-11-01 DIAGNOSIS — E1122 Type 2 diabetes mellitus with diabetic chronic kidney disease: Secondary | ICD-10-CM | POA: Diagnosis present

## 2018-11-01 DIAGNOSIS — N183 Chronic kidney disease, stage 3 (moderate): Secondary | ICD-10-CM | POA: Diagnosis present

## 2018-11-01 DIAGNOSIS — Z7902 Long term (current) use of antithrombotics/antiplatelets: Secondary | ICD-10-CM | POA: Diagnosis not present

## 2018-11-01 DIAGNOSIS — J189 Pneumonia, unspecified organism: Secondary | ICD-10-CM | POA: Diagnosis present

## 2018-11-01 DIAGNOSIS — E860 Dehydration: Secondary | ICD-10-CM | POA: Diagnosis present

## 2018-11-01 DIAGNOSIS — R627 Adult failure to thrive: Secondary | ICD-10-CM | POA: Diagnosis present

## 2018-11-01 DIAGNOSIS — J440 Chronic obstructive pulmonary disease with acute lower respiratory infection: Secondary | ICD-10-CM | POA: Diagnosis present

## 2018-11-01 DIAGNOSIS — R4702 Dysphasia: Secondary | ICD-10-CM | POA: Diagnosis present

## 2018-11-01 DIAGNOSIS — R918 Other nonspecific abnormal finding of lung field: Secondary | ICD-10-CM | POA: Diagnosis not present

## 2018-11-01 DIAGNOSIS — E0800 Diabetes mellitus due to underlying condition with hyperosmolarity without nonketotic hyperglycemic-hyperosmolar coma (NKHHC): Secondary | ICD-10-CM | POA: Diagnosis not present

## 2018-11-01 DIAGNOSIS — R5081 Fever presenting with conditions classified elsewhere: Secondary | ICD-10-CM | POA: Diagnosis present

## 2018-11-01 DIAGNOSIS — K222 Esophageal obstruction: Secondary | ICD-10-CM | POA: Diagnosis present

## 2018-11-01 DIAGNOSIS — I129 Hypertensive chronic kidney disease with stage 1 through stage 4 chronic kidney disease, or unspecified chronic kidney disease: Secondary | ICD-10-CM | POA: Diagnosis present

## 2018-11-01 DIAGNOSIS — E785 Hyperlipidemia, unspecified: Secondary | ICD-10-CM | POA: Diagnosis present

## 2018-11-01 DIAGNOSIS — R1310 Dysphagia, unspecified: Secondary | ICD-10-CM

## 2018-11-01 DIAGNOSIS — Z8249 Family history of ischemic heart disease and other diseases of the circulatory system: Secondary | ICD-10-CM | POA: Diagnosis not present

## 2018-11-01 DIAGNOSIS — N289 Disorder of kidney and ureter, unspecified: Secondary | ICD-10-CM | POA: Diagnosis not present

## 2018-11-01 DIAGNOSIS — Z79899 Other long term (current) drug therapy: Secondary | ICD-10-CM | POA: Diagnosis not present

## 2018-11-01 DIAGNOSIS — Z7982 Long term (current) use of aspirin: Secondary | ICD-10-CM | POA: Diagnosis not present

## 2018-11-01 LAB — BASIC METABOLIC PANEL
Anion gap: 9 (ref 5–15)
BUN: 36 mg/dL — ABNORMAL HIGH (ref 6–20)
CHLORIDE: 110 mmol/L (ref 98–111)
CO2: 25 mmol/L (ref 22–32)
Calcium: 8.3 mg/dL — ABNORMAL LOW (ref 8.9–10.3)
Creatinine, Ser: 1.82 mg/dL — ABNORMAL HIGH (ref 0.61–1.24)
GFR calc non Af Amer: 42 mL/min — ABNORMAL LOW (ref >60)
GFR, EST AFRICAN AMERICAN: 48 mL/min — AB (ref >60)
Glucose, Bld: 88 mg/dL (ref 70–99)
Potassium: 3.8 mmol/L (ref 3.5–5.1)
Sodium: 144 mmol/L (ref 135–145)

## 2018-11-01 LAB — CBC
HCT: 20.9 % — ABNORMAL LOW (ref 39.0–52.0)
Hemoglobin: 6.6 g/dL — CL (ref 13.0–17.0)
MCH: 27 pg (ref 26.0–34.0)
MCHC: 31.6 g/dL (ref 30.0–36.0)
MCV: 85.7 fL (ref 80.0–100.0)
Platelets: 86 10*3/uL — ABNORMAL LOW (ref 150–400)
RBC: 2.44 MIL/uL — ABNORMAL LOW (ref 4.22–5.81)
RDW: 13.2 % (ref 11.5–15.5)
WBC: 0.8 10*3/uL — AB (ref 4.0–10.5)
nRBC: 0 % (ref 0.0–0.2)

## 2018-11-01 LAB — GLUCOSE, CAPILLARY
Glucose-Capillary: 86 mg/dL (ref 70–99)
Glucose-Capillary: 82 mg/dL (ref 70–99)
Glucose-Capillary: 99 mg/dL (ref 70–99)
Glucose-Capillary: 82 mg/dL (ref 70–99)

## 2018-11-01 LAB — PREPARE RBC (CROSSMATCH)

## 2018-11-01 LAB — ABO/RH: ABO/RH(D): A POS

## 2018-11-01 MED ORDER — ISOSORBIDE MONONITRATE ER 60 MG PO TB24
120.0000 mg | ORAL_TABLET | Freq: Every day | ORAL | Status: DC
  Administered 2018-11-01 – 2018-11-07 (×7): 120 mg via ORAL
  Filled 2018-11-01 (×8): qty 2

## 2018-11-01 MED ORDER — PRAZOSIN HCL 1 MG PO CAPS
1.0000 mg | ORAL_CAPSULE | Freq: Every day | ORAL | Status: DC
  Administered 2018-11-01 – 2018-11-07 (×6): 1 mg via ORAL
  Filled 2018-11-01 (×7): qty 1

## 2018-11-01 MED ORDER — NEBIVOLOL HCL 10 MG PO TABS
20.0000 mg | ORAL_TABLET | Freq: Every day | ORAL | Status: DC
  Administered 2018-11-01 – 2018-11-07 (×6): 20 mg via ORAL
  Filled 2018-11-01 (×8): qty 2

## 2018-11-01 MED ORDER — FLUOXETINE HCL 20 MG PO CAPS
40.0000 mg | ORAL_CAPSULE | Freq: Every day | ORAL | Status: DC
  Administered 2018-11-01 – 2018-11-07 (×7): 40 mg via ORAL
  Filled 2018-11-01 (×8): qty 2

## 2018-11-01 MED ORDER — POTASSIUM CHLORIDE IN NACL 20-0.45 MEQ/L-% IV SOLN
INTRAVENOUS | Status: AC
  Administered 2018-11-01: 125 mL/h via INTRAVENOUS
  Administered 2018-11-01: 03:00:00 via INTRAVENOUS
  Filled 2018-11-01 (×3): qty 1000

## 2018-11-01 MED ORDER — MOMETASONE FURO-FORMOTEROL FUM 100-5 MCG/ACT IN AERO
2.0000 | INHALATION_SPRAY | Freq: Two times a day (BID) | RESPIRATORY_TRACT | Status: DC
  Administered 2018-11-01 – 2018-11-07 (×12): 2 via RESPIRATORY_TRACT
  Filled 2018-11-01 (×2): qty 8.8

## 2018-11-01 MED ORDER — DRONABINOL 2.5 MG PO CAPS
5.0000 mg | ORAL_CAPSULE | Freq: Two times a day (BID) | ORAL | Status: DC
  Administered 2018-11-01 – 2018-11-04 (×6): 5 mg via ORAL
  Filled 2018-11-01 (×6): qty 2
  Filled 2018-11-01 (×2): qty 1

## 2018-11-01 MED ORDER — ONDANSETRON HCL 4 MG PO TABS
4.0000 mg | ORAL_TABLET | Freq: Four times a day (QID) | ORAL | Status: DC | PRN
  Administered 2018-11-06: 4 mg via ORAL
  Filled 2018-11-01: qty 1

## 2018-11-01 MED ORDER — ENSURE ENLIVE PO LIQD
237.0000 mL | Freq: Two times a day (BID) | ORAL | Status: DC
  Administered 2018-11-03 – 2018-11-07 (×7): 237 mL via ORAL

## 2018-11-01 MED ORDER — SODIUM CHLORIDE 0.9% IV SOLUTION
Freq: Once | INTRAVENOUS | Status: AC
  Administered 2018-11-06: 23:00:00 via INTRAVENOUS

## 2018-11-01 MED ORDER — LIDOCAINE VISCOUS HCL 2 % MT SOLN
15.0000 mL | Freq: Four times a day (QID) | OROMUCOSAL | Status: DC | PRN
  Filled 2018-11-01: qty 15

## 2018-11-01 MED ORDER — ALUM & MAG HYDROXIDE-SIMETH 200-200-20 MG/5ML PO SUSP: 30.0000 mL | Freq: Four times a day (QID) | ORAL | Status: DC | PRN

## 2018-11-01 MED ORDER — ACETAMINOPHEN 325 MG PO TABS
650.0000 mg | ORAL_TABLET | Freq: Four times a day (QID) | ORAL | Status: DC | PRN
  Administered 2018-11-01 – 2018-11-05 (×5): 650 mg via ORAL
  Filled 2018-11-01 (×5): qty 2

## 2018-11-01 MED ORDER — ADULT MULTIVITAMIN W/MINERALS CH
1.0000 | ORAL_TABLET | Freq: Every day | ORAL | Status: DC
  Administered 2018-11-02 – 2018-11-07 (×4): 1 via ORAL
  Filled 2018-11-01 (×6): qty 1

## 2018-11-01 MED ORDER — BOOST / RESOURCE BREEZE PO LIQD CUSTOM
1.0000 | ORAL | Status: DC
  Administered 2018-11-01 – 2018-11-07 (×4): 1 via ORAL

## 2018-11-01 MED ORDER — TIOTROPIUM BROMIDE MONOHYDRATE 18 MCG IN CAPS
18.0000 ug | ORAL_CAPSULE | Freq: Every day | RESPIRATORY_TRACT | Status: DC
  Administered 2018-11-01 – 2018-11-07 (×7): 18 ug via RESPIRATORY_TRACT
  Filled 2018-11-01 (×2): qty 5

## 2018-11-01 MED ORDER — CLOPIDOGREL BISULFATE 75 MG PO TABS
75.0000 mg | ORAL_TABLET | Freq: Every day | ORAL | Status: DC
  Administered 2018-11-01: 75 mg via ORAL
  Filled 2018-11-01: qty 1

## 2018-11-01 MED ORDER — FLUTICASONE PROPIONATE 50 MCG/ACT NA SUSP
2.0000 | Freq: Every day | NASAL | Status: DC | PRN
  Filled 2018-11-01: qty 16

## 2018-11-01 MED ORDER — BOOST / RESOURCE BREEZE PO LIQD CUSTOM
1.0000 | Freq: Three times a day (TID) | ORAL | Status: DC
  Administered 2018-11-01: 1 via ORAL

## 2018-11-01 MED ORDER — SODIUM CHLORIDE 0.9% FLUSH
10.0000 mL | Freq: Two times a day (BID) | INTRAVENOUS | Status: DC
  Administered 2018-11-01 – 2018-11-07 (×4): 10 mL

## 2018-11-01 MED ORDER — ACETAMINOPHEN 650 MG RE SUPP: 650.0000 mg | Freq: Four times a day (QID) | RECTAL | Status: DC | PRN

## 2018-11-01 MED ORDER — RANOLAZINE ER 500 MG PO TB12
1000.0000 mg | ORAL_TABLET | Freq: Two times a day (BID) | ORAL | Status: DC
  Administered 2018-11-01 – 2018-11-07 (×14): 1000 mg via ORAL
  Filled 2018-11-01 (×15): qty 2

## 2018-11-01 MED ORDER — HEPARIN SODIUM (PORCINE) 5000 UNIT/ML IJ SOLN
5000.0000 [IU] | Freq: Three times a day (TID) | INTRAMUSCULAR | Status: AC
  Administered 2018-11-01 (×3): 5000 [IU] via SUBCUTANEOUS
  Filled 2018-11-01 (×3): qty 1

## 2018-11-01 MED ORDER — ASPIRIN 81 MG PO CHEW
81.0000 mg | CHEWABLE_TABLET | Freq: Every day | ORAL | Status: DC
  Administered 2018-11-01 – 2018-11-05 (×5): 81 mg via ORAL
  Filled 2018-11-01 (×5): qty 1

## 2018-11-01 MED ORDER — POLYETHYLENE GLYCOL 3350 17 G PO PACK: 17.0000 g | PACK | Freq: Every day | ORAL | Status: DC | PRN

## 2018-11-01 MED ORDER — ROSUVASTATIN CALCIUM 20 MG PO TABS
20.0000 mg | ORAL_TABLET | Freq: Every day | ORAL | Status: DC
  Administered 2018-11-01 – 2018-11-07 (×7): 20 mg via ORAL
  Filled 2018-11-01 (×8): qty 1

## 2018-11-01 MED ORDER — PANTOPRAZOLE SODIUM 40 MG PO TBEC
40.0000 mg | DELAYED_RELEASE_TABLET | Freq: Every day | ORAL | Status: DC
  Administered 2018-11-01 – 2018-11-07 (×7): 40 mg via ORAL
  Filled 2018-11-01 (×8): qty 1

## 2018-11-01 MED ORDER — SODIUM CHLORIDE 0.9% FLUSH
10.0000 mL | INTRAVENOUS | Status: DC | PRN
  Administered 2018-11-07 – 2018-11-08 (×2): 10 mL
  Filled 2018-11-01 (×2): qty 40

## 2018-11-01 MED ORDER — MORPHINE SULFATE (PF) 2 MG/ML IV SOLN
2.0000 mg | INTRAVENOUS | Status: DC | PRN
  Administered 2018-11-01 (×4): 2 mg via INTRAVENOUS
  Administered 2018-11-01: 1 mg via INTRAVENOUS
  Administered 2018-11-02: 2 mg via INTRAVENOUS
  Filled 2018-11-01 (×6): qty 1

## 2018-11-01 MED ORDER — ALBUTEROL SULFATE (2.5 MG/3ML) 0.083% IN NEBU: 2.5000 mg | INHALATION_SOLUTION | Freq: Four times a day (QID) | RESPIRATORY_TRACT | Status: DC | PRN

## 2018-11-01 MED ORDER — INSULIN ASPART 100 UNIT/ML ~~LOC~~ SOLN: 0.0000 [IU] | Freq: Three times a day (TID) | SUBCUTANEOUS | Status: DC

## 2018-11-01 MED ORDER — ONDANSETRON HCL 4 MG/2ML IJ SOLN
4.0000 mg | Freq: Four times a day (QID) | INTRAMUSCULAR | Status: DC | PRN
  Administered 2018-11-01 – 2018-11-08 (×9): 4 mg via INTRAVENOUS
  Filled 2018-11-01 (×9): qty 2

## 2018-11-01 MED ORDER — MIRTAZAPINE 15 MG PO TABS
30.0000 mg | ORAL_TABLET | Freq: Every day | ORAL | Status: DC
  Administered 2018-11-01 – 2018-11-07 (×7): 30 mg via ORAL
  Filled 2018-11-01 (×8): qty 2

## 2018-11-01 NOTE — Plan of Care (Signed)
Plan of care discussed.   

## 2018-11-01 NOTE — Progress Notes (Signed)
Initial Nutrition Assessment  DOCUMENTATION CODES:   (Will continue to assess for malnutrition)  INTERVENTION:  - Will decrease Boost Breeze to BID, each supplement provides 250 kcal and 9 grams of protein. - Will order Ensure Enlive BID, each supplement provides 350 kcal and 20 grams of protein. - Will order daily multivitamin with minerals.    NUTRITION DIAGNOSIS:   Increased nutrient needs related to chronic illness, catabolic illness, cancer and cancer related treatments as evidenced by estimated needs.  GOAL:   Patient will meet greater than or equal to 90% of their needs  MONITOR:   PO intake, Supplement acceptance, Weight trends, Labs  REASON FOR ASSESSMENT:   Consult Assessment of nutrition requirement/status  ASSESSMENT:   52 year old with past medical history relevant for small cell lung cancer on chemotherapy and radiation (radiation 1 week ago, chemotherapy 3 weeks ago), COPD, BPH, stage III CKD, grade 1 diastolic dysfunction by echo on 02/2018, CAD s/p stents. He presented to the ED with dysphasia thought to be 2/2 a combination of radiation esophagitis and/or possible extrinsic compression of esophagus from adenopathy; also dx with AKI.  BMI indicates obesity. No PO intakes documented since admission. Patient laying in bed with hand over face and mom and wife at bedside. Patient dos not interact with RD. Wife provides all information. Patient was seen by this RD on 11/19 and by [facility] RD on 12/3; during visit at [facility], note indicates that patient had a sheet over his head and refused to remove it and that wife reported that patient was not eating but would occasionally take in some Ensure or Boost.   Wife reports that this has still been the case. She states that patient has not expressed any pain with eating and no nausea. She states he has indicated that nothing sounds good and he does not have an appetite. She continually encourages him and reminds him of the  importance of good nutrition during/in order to receive treatment. Patient will sometimes "force down" a half to a full bottle of Ensure.   Per chart review, current weight is 209 lb and weight on 11/19 was 228 lb. This indicates 19 lb weight loss (8% body weight) in 1 month; significant for time frame. Also noted high rate IVF.  Suspect patient meets criteria for some degree of malnutrition but unable to confirm without NFPE.    Medications reviewed; 5 mg Marinol BID, 40 mg Prozac/day, sliding scale Novolog, 30 g Remeron/day, 5 mL Mycostatin QID. Labs reviewed; BUN: 36 mg/dL, creatinine: 1.82 mg/dL, Ca: 8.3 mg/dL. IVF; 1/2 NS @ 125 mL/hr.     NUTRITION - FOCUSED PHYSICAL EXAM:  Patient refused; will attempt again at follow-up.   Diet Order:   Diet Order            Diet regular Room service appropriate? Yes; Fluid consistency: Thin  Diet effective now              EDUCATION NEEDS:   Not appropriate for education at this time  Skin:  Skin Assessment: Reviewed RN Assessment  Last BM:  12/10  Height:   Ht Readings from Last 1 Encounters:  10/31/18 5\' 9"  (1.753 m)    Weight:   Wt Readings from Last 1 Encounters:  10/31/18 95 kg    Ideal Body Weight:  72.73 kg  BMI:  Body mass index is 30.93 kg/m.  Estimated Nutritional Needs:   Kcal:  4097-3532 kcal  Protein:  125-135 grams  Fluid:  >/= 2.2 L/day  Jarome Matin, MS, RD, LDN, [facility] Inpatient Clinical Dietitian Pager # [phone] After hours/weekend pager # [phone]

## 2018-11-01 NOTE — Consult Note (Addendum)
Referring Provider:  Dr. Herbert Moors, [facility] Primary Care Physician:  [name], MD Primary Gastroenterologist:  Dr. Carlean Purl  Reason for Consultation:  Dysphagia, odynophagia  HPI: Andrew West is a 52 y.o. male with medical history significant for small cell lung cancer on chemo and radiation therapy, DM2, BPH, COPD, CAD who presented to the ED with complaints of vomiting and right lower abdominal pain.  Vomitus sometimes with some streaks of red blood.  Poor PO intake since starting chemo treatment 3 months ago.    Hospital admission 11/17- 11/20 for acute kidney injury, improved with IV fluids.  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  Had barium esophagram but due to improvement in symptoms he did not undergo EGD.    ED Course: Initial tachycardia at 111, tachypnea to 28.  Sats greater than 94% on room air.  Creatinine elevated 2.1, from recent discharge creatinine of 1.3.  WBC low 2.2.  Hemoglobin low 7.7, platelets low 119.  Actiq acid normal 0.75.  FOBT negative.  Portable chest x-ray clear. UA -pending.  Cultures ordered pending.  1.5 L NS given in ED.    CT abdomen and pelvis without contrast showed no acute intra-abdominal or pelvic pathology.  This AM WBC count is 0.8, Hgb is 6.6 grams, and platelets are 86.  Is going to receive 2 units PRBCs.  Is on plavix for history of CAD and is receiving heparin injections TID for DVT prophylaxis.  Describes mostly trouble with swallowing solid foods but sometimes difficult with liquids as well.  Sometimes they get hung up but then sometimes they go down and just makes him nauseated so that he vomits.  Has seen some streaks of red blood in emesis at times.  No significant pain with swallowing from what it sounds like unless something actually physically gets stuck.  No black or bloody stools.  Was heme negative in the ED.  Is on  pantoprazole 40 mg daily by mouth and nystatin suspension currently.   Past Medical History:  Diagnosis Date  . Anxiety   . CAD (coronary artery disease)    a. s/p multiple PCIs with last cath 11/2016 with severe multivessel CAD, s/p PCTA to LCx but unable to pass stent  . Chronic leg pain    bilateral  . Chronic lower back pain   . COPD (chronic obstructive pulmonary disease) ([place])   . Depression   . GERD (gastroesophageal reflux disease)    Takes Dexilant  . HLD (hyperlipidemia)   . Hypertension   . Rhabdomyolysis    h/o, r/t statins  . Sleep apnea    "can't tolerate mask" (12/16/2016)  . Type II diabetes mellitus ([place])     Past Surgical History:  Procedure Laterality Date  . BACK SURGERY    . BRONCHIAL BIOPSY  08/25/2018   Procedure: BRONCHIAL BIOPSIES;  Surgeon: Garner Nash, DO;  Location: [facility];  Service: Cardiopulmonary;;  . CARDIAC CATHETERIZATION N/A 09/25/2015   Procedure: Left Heart Cath and Coronary Angiography;  Surgeon: Leonie Man, MD;  Location: [place] [facility];  Service: Cardiovascular;  Laterality: N/A;  . CARDIAC CATHETERIZATION N/A 12/16/2016   Procedure: Left Heart Cath and Coronary Angiography;  Surgeon: Leonie Man, MD;  Location: [place] [facility];  Service: Cardiovascular;  Laterality: N/A;  . CARDIAC CATHETERIZATION N/A 12/16/2016   Procedure: Coronary Balloon Angioplasty;  Surgeon: Leonie Man, MD;  Location: [place] [facility];  Service: Cardiovascular;  Laterality: N/A;  . COLONOSCOPY W/ POLYPECTOMY    . CORONARY ANGIOPLASTY  09/25/2015   mid cir & om  . CORONARY ANGIOPLASTY WITH STENT PLACEMENT  10/09/2001   PTCA & stenting of mid AV circumflex; 2.5x27m Pixel stent  . CORONARY ANGIOPLASTY WITH STENT PLACEMENT  12/13/2001   PCI with stent to mid L circumflex, 95% stenosis to 0% residual  . CORONARY ANGIOPLASTY WITH STENT PLACEMENT  10/10/2003   PCI to mid AV circumflex; LAD 30% disease; RCA 100% occluded prox.  . CORONARY  ANGIOPLASTY WITH STENT PLACEMENT  09/01/2011   PCI with stenting with bare metal stent to mid AV groove circumflex and PDA  . CORONARY ANGIOPLASTY WITH STENT PLACEMENT  10/17/2011   cutting balloon angioplasty of ostial lateral OM1 branch and bifurcation AV groove circumflex OM junction; stenosis reduced to 0%  . ENDOBRONCHIAL ULTRASOUND Bilateral 08/25/2018   Procedure: ENDOBRONCHIAL ULTRASOUND;  Surgeon: IGarner Nash DO;  Location: [facility];  Service: Cardiopulmonary;  Laterality: Bilateral;  . EXCISIONAL HEMORRHOIDECTOMY    . FINE NEEDLE ASPIRATION  08/25/2018   Procedure: FINE NEEDLE ASPIRATION;  Surgeon: IGarner Nash DO;  Location: [facility];  Service: Cardiopulmonary;;  . FLEXIBLE BRONCHOSCOPY  08/25/2018   Procedure: FLEXIBLE BRONCHOSCOPY;  Surgeon: IGarner Nash DO;  Location: [facility];  Service: Cardiopulmonary;;  . IR IMAGING GUIDED PORT INSERTION  09/29/2018  . LEFT HEART CATHETERIZATION WITH CORONARY ANGIOGRAM N/A 10/18/2011   Procedure: LEFT HEART CATHETERIZATION WITH CORONARY ANGIOGRAM;  Surgeon: DLeonie Man MD;  [facility];  Service: Cardiovascular;  Laterality: N/A;  . LUMBAR LAMINECTOMY/DECOMPRESSION MICRODISCECTOMY  03/31/2012   Procedure: LUMBAR LAMINECTOMY/DECOMPRESSION MICRODISCECTOMY 1 LEVEL;  Surgeon: HCharlie Pitter MD;  [facility];  Service: Neurosurgery;  Laterality: Left;  . TRANSTHORACIC ECHOCARDIOGRAM  07/28/2011   EF 55-65%; LVH, grade 1 diastolic dysfunction;     Prior to Admission medications   Medication Sig Start Date End Date Taking? Authorizing Provider  albuterol (PROAIR HFA) 108 (90 Base) MCG/ACT inhaler Inhale 1 puff into the lungs every 6 (six) hours as needed for wheezing or shortness of breath. 08/29/18  Yes NCharlott Rakes MD  amLODipine (NORVASC) 10 MG tablet Take 1 tablet (10 mg total) by mouth daily. 04/20/18  Yes NCharlott Rakes MD  aspirin 81 MG tablet Take 1 tablet (81 mg total) daily by mouth.  10/10/17  Yes Newlin, Enobong, MD  baclofen (LIORESAL) 10 MG tablet Take 1 tablet (10 mg total) by mouth 3 (three) times daily as needed for muscle spasms. 10/11/18  Yes AAline August MD  benzonatate (TESSALON) 100 MG capsule Take 1 capsule (100 mg total) by mouth every 8 (eight) hours. Patient taking differently: Take 100 mg by mouth 3 (three) times daily as needed for cough.  09/19/18  Yes MCurt Bears MD  Blood Glucose Monitoring Suppl (ACCU-CHEK AVIVA PLUS) w/Device KIT Use as dircted 07/12/17  Yes NCharlott Rakes MD  Cholecalciferol (VITAMIN D) 2000 units tablet Take 1 tablet (2,000 Units total) by mouth daily. 08/04/18  Yes PJamse Arn MD  clopidogrel (PLAVIX) 75 MG tablet Take 1 tablet (75 mg total) by mouth daily. YOU MAY RESTART 08/26/2018 08/25/18  Yes Icard, Bradley L, DO  diclofenac sodium (VOLTAREN) 1 % GEL Apply 2 g topically 4 (four) times daily. Patient taking differently: Apply 2 g topically 4 (four) times daily as needed (pain).  04/20/18  Yes NCharlott Rakes MD  dronabinol (MARINOL) 5 MG capsule Take 1  capsule (5 mg total) by mouth 2 (two) times daily before a meal. 10/18/18  Yes Tyler Pita, MD  FLUoxetine (PROZAC) 40 MG capsule Take 1 capsule (40 mg total) by mouth daily. 04/20/18  Yes Newlin, Charlane Ferretti, MD  fluticasone (FLONASE) 50 MCG/ACT nasal spray Place 2 sprays into both nostrils daily as needed for allergies. 10/11/18  Yes Aline August, MD  Fluticasone-Salmeterol (ADVAIR) 100-50 MCG/DOSE AEPB Inhale 1 puff into the lungs 2 (two) times daily. 07/25/18  Yes [name], MD  glucose blood (ACCU-CHEK AVIVA) test strip Use as instructed 07/12/17  Yes Newlin, Enobong, MD  hydrALAZINE (APRESOLINE) 25 MG tablet Take 1 tablet (25 mg total) by mouth 2 (two) times daily. 04/20/18  Yes [name], MD  HYDROcodone-acetaminophen (NORCO) 5-325 MG tablet Take 1-2 tablets by mouth every 6 (six) hours as needed for moderate pain. 10/17/18  Yes Bruning, Ashlyn, PA-C    hydrocortisone-pramoxine (ANALPRAM-HC) 2.5-1 % rectal cream Place 1 application rectally as needed. Patient taking differently: Place 1 application rectally as needed for hemorrhoids or anal itching.  10/11/18  Yes Aline August, MD  isosorbide mononitrate (IMDUR) 120 MG 24 hr tablet Take 1 tablet (120 mg total) by mouth daily. 04/20/18  Yes [name], MD  lidocaine-prilocaine (EMLA) cream Apply 1 application topically as needed. Apply 1 tsp on skin over port site one hour prior to chemotherapy. Cover with plastic wrap. Do not rub in medication. 10/03/18  Yes Curt Bears, MD  LORazepam (ATIVAN) 0.5 MG tablet 1 tab po q 4-6 hours prn or 1 tab po 30 minutes prior to radiation 08/30/18  Yes Hayden Pedro, PA-C  mirtazapine (REMERON) 30 MG tablet Take 1 tablet (30 mg total) by mouth at bedtime. 08/31/18  Yes Curt Bears, MD  Nebivolol HCl 20 MG TABS Take 1 tablet (20 mg total) by mouth daily. 01/26/18  Yes Hilty, Nadean Corwin, MD  nicotine (NICODERM CQ) 21 mg/24hr patch Place 1 patch (21 mg total) onto the skin daily. 08/31/18  Yes Curt Bears, MD  nitroGLYCERIN (NITROSTAT) 0.4 MG SL tablet Place 1 tablet (0.4 mg total) under the tongue every 5 (five) minutes as needed for chest pain. 12/24/16  Yes Strader, [place] M, PA-C  ondansetron (ZOFRAN) 8 MG tablet Take 1 tablet (8 mg total) by mouth every 8 (eight) hours as needed for nausea or vomiting. 09/20/18  Yes Curt Bears, MD  pantoprazole (PROTONIX) 40 MG tablet Take 1 tablet (40 mg total) by mouth daily. 10/11/18  Yes Aline August, MD  prazosin (MINIPRESS) 1 MG capsule Take 1 capsule (1 mg total) by mouth at bedtime. For nightmares 07/25/18  Yes [name], MD  prochlorperazine (COMPAZINE) 10 MG tablet Take 1 tablet (10 mg total) by mouth every 6 (six) hours as needed for nausea or vomiting. 10/23/18  Yes Curt Bears, MD  promethazine (PHENERGAN) 25 MG tablet Take 1 tablet (25 mg total) by mouth every 6 (six) hours  as needed for nausea or vomiting. 10/22/18  Yes Deno Etienne, DO  ranolazine (RANEXA) 1000 MG SR tablet Take 1 tablet (1,000 mg total) by mouth 2 (two) times daily. 05/24/17  Yes Hilty, Nadean Corwin, MD  rosuvastatin (CRESTOR) 20 MG tablet Take 1 tablet (20 mg total) by mouth daily. 07/26/18  Yes [name], MD  sucralfate (CARAFATE) 1 g tablet Take 1 tablet by mouth 4 (four) times daily -  before meals and at bedtime. 09/23/18  Yes [provider]  tiotropium (SPIRIVA HANDIHALER) 18 MCG inhalation capsule Place 1 capsule (  18 mcg total) into inhaler and inhale daily. 04/20/18  Yes [name], MD  vitamin B-12 (CYANOCOBALAMIN) 500 MCG tablet Take 1 tablet (500 mcg total) by mouth daily. 06/30/18  Yes Jamse Arn, MD  zolpidem (AMBIEN) 5 MG tablet Take 1 tablet (5 mg total) by mouth at bedtime as needed for sleep. 09/14/18  Yes [name], MD    Current Facility-Administered Medications  Medication Dose Route Frequency Provider Last Rate Last Dose  . 0.45 % NaCl with KCl 20 mEq / L infusion   Intravenous Continuous Emokpae, Ejiroghene E, MD 125 mL/hr at 11/01/18 1032 125 mL/hr at 11/01/18 1032  . 0.9 %  sodium chloride infusion (Manually program via Guardrails IV Fluids)   Intravenous Once Purohit, Shrey C, MD      . acetaminophen (TYLENOL) tablet 650 mg  650 mg Oral Q6H PRN Emokpae, Ejiroghene E, MD       Or  . acetaminophen (TYLENOL) suppository 650 mg  650 mg Rectal Q6H PRN Emokpae, Ejiroghene E, MD      . albuterol (PROVENTIL) (2.5 MG/3ML) 0.083% nebulizer solution 2.5 mg  2.5 mg Inhalation Q6H PRN Emokpae, Ejiroghene E, MD      . aspirin chewable tablet 81 mg  81 mg Oral Daily Emokpae, Ejiroghene E, MD   81 mg at 11/01/18 1034  . clopidogrel (PLAVIX) tablet 75 mg  75 mg Oral Q breakfast Emokpae, Ejiroghene E, MD   75 mg at 11/01/18 0817  . dronabinol (MARINOL) capsule 5 mg  5 mg Oral BID AC Emokpae, Ejiroghene E, MD   5 mg at 11/01/18 0817  . feeding supplement (BOOST /  RESOURCE BREEZE) liquid 1 Container  1 Container Oral TID BM Emokpae, Ejiroghene E, MD      . FLUoxetine (PROZAC) capsule 40 mg  40 mg Oral Daily Emokpae, Ejiroghene E, MD   40 mg at 11/01/18 1034  . fluticasone (FLONASE) 50 MCG/ACT nasal spray 2 spray  2 spray Each Nare Daily PRN Emokpae, Ejiroghene E, MD      . heparin injection 5,000 Units  5,000 Units Subcutaneous Q8H Emokpae, Ejiroghene E, MD   5,000 Units at 11/01/18 0546  . insulin aspart (novoLOG) injection 0-9 Units  0-9 Units Subcutaneous TID WC Emokpae, Ejiroghene E, MD      . isosorbide mononitrate (IMDUR) 24 hr tablet 120 mg  120 mg Oral Daily Emokpae, Ejiroghene E, MD   120 mg at 11/01/18 1034  . mirtazapine (REMERON) tablet 30 mg  30 mg Oral QHS Emokpae, Ejiroghene E, MD      . mometasone-formoterol (DULERA) 100-5 MCG/ACT inhaler 2 puff  2 puff Inhalation BID Emokpae, Ejiroghene E, MD   2 puff at 11/01/18 0937  . morphine 2 MG/ML injection 2 mg  2 mg Intravenous Q4H PRN Emokpae, Ejiroghene E, MD   2 mg at 11/01/18 0817  . nebivolol (BYSTOLIC) tablet 20 mg  20 mg Oral Daily Emokpae, Ejiroghene E, MD   20 mg at 11/01/18 1033  . nystatin (MYCOSTATIN) 100000 UNIT/ML suspension 500,000 Units  5 mL Oral QID Emokpae, Ejiroghene E, MD   500,000 Units at 11/01/18 1032  . ondansetron (ZOFRAN) tablet 4 mg  4 mg Oral Q6H PRN Emokpae, Ejiroghene E, MD       Or  . ondansetron (ZOFRAN) injection 4 mg  4 mg Intravenous Q6H PRN Emokpae, Ejiroghene E, MD   4 mg at 11/01/18 0308  . pantoprazole (PROTONIX) EC tablet 40 mg  40 mg Oral Daily Emokpae,  Ejiroghene E, MD   40 mg at 11/01/18 1034  . polyethylene glycol (MIRALAX / GLYCOLAX) packet 17 g  17 g Oral Daily PRN Emokpae, Ejiroghene E, MD      . prazosin (MINIPRESS) capsule 1 mg  1 mg Oral QHS Emokpae, Ejiroghene E, MD      . ranolazine (RANEXA) 12 hr tablet 1,000 mg  1,000 mg Oral BID Emokpae, Ejiroghene E, MD   1,000 mg at 11/01/18 1033  . rosuvastatin (CRESTOR) tablet 20 mg  20 mg Oral Daily  Emokpae, Ejiroghene E, MD   20 mg at 11/01/18 1034  . sodium chloride flush (NS) 0.9 % injection 10-40 mL  10-40 mL Intracatheter Q12H Purohit, Shrey C, MD      . sodium chloride flush (NS) 0.9 % injection 10-40 mL  10-40 mL Intracatheter PRN Purohit, Shrey C, MD      . tiotropium (SPIRIVA) inhalation capsule ([facility] use ONLY) 18 mcg  18 mcg Inhalation Daily Emokpae, Ejiroghene E, MD   18 mcg at 11/01/18 0938    Allergies as of 10/31/2018 - Review Complete 10/31/2018  Allergen Reaction Noted  . Iohexol Anaphylaxis 11/26/2008    Family History  Problem Relation Age of Onset  . Heart attack Father   . Hypertension Mother   . Diabetes Mother   . Heart disease Brother        x 3   . Heart attack Brother        deceased  . Hypertension Sister   . Diabetes Sister   . Anesthesia problems Neg Hx   . Hypotension Neg Hx   . Malignant hyperthermia Neg Hx   . Pseudochol deficiency Neg Hx     Social History   Socioeconomic History  . Marital status: Married    Spouse name: Not on file  . Number of children: Not on file  . Years of education: Not on file  . Highest education level: Not on file  Occupational History  . Not on file  Social Needs  . Financial resource strain: Not on file  . Food insecurity:    Worry: Not on file    Inability: Not on file  . Transportation needs:    Medical: No    Non-medical: No  Tobacco Use  . Smoking status: Former Smoker    Packs/day: 0.25    Years: 25.00    Pack years: 6.25    Types: Cigarettes    Last attempt to quit: 05/24/2015    Years since quitting: 3.4  . Smokeless tobacco: Never Used  Substance and Sexual Activity  . Alcohol use: No    Alcohol/week: 0.0 standard drinks  . Drug use: No  . Sexual activity: Yes  Lifestyle  . Physical activity:    Days per week: Not on file    Minutes per session: Not on file  . Stress: Not on file  Relationships  . Social connections:    Talks on phone: Not on file    Gets together: Not on file     Attends religious service: Not on file    Active member of club or organization: Not on file    Attends meetings of clubs or organizations: Not on file    Relationship status: Not on file  . Intimate partner violence:    Fear of current or ex partner: Not on file    Emotionally abused: Not on file    Physically abused: Not on file    Forced sexual activity: Not  on file  Other Topics Concern  . Not on file  Social History Narrative  . Not on file    Review of Systems: ROS is O/W negative except as mentioned in HPI.  Physical Exam: Vital signs in last 24 hours: Temp:  [98.9 F (37.2 C)-99.9 F (37.7 C)] 99.9 F (37.7 C) (12/11 0453) Pulse Rate:  [77-111] 77 (12/11 1030) Resp:  [16-28] 16 (12/11 0453) BP: (107-123)/(73-87) 115/77 (12/11 1030) SpO2:  [90 %-100 %] 90 % (12/11 0938) Weight:  [95 kg] 95 kg (12/10 2013) Last BM Date: 10/31/18(liquid smear amount, patient has a lack of appetite) General:  Alert, Well-developed, well-nourished, pleasant and cooperative in NAD Head:  Normocephalic and atraumatic. Eyes:  Sclera clear, no icterus.  Conjunctiva pink. Ears:  Normal auditory acuity. Mouth:  No deformity or lesions.   Lungs:  Clear throughout to auscultation.  No wheezes, crackles, or rhonchi.  Port-a-cath noted on chest. Heart:  Regular rate and rhythm; no murmurs, clicks, rubs, or gallops. Abdomen:  Soft, non-distended.  BS present.  Mild right sided (RLQ TTP). Msk:  Symmetrical without gross deformities. Pulses:  Normal pulses noted. Extremities:  Without clubbing or edema. Neurologic:  Alert and oriented x 4;  grossly normal neurologically. Skin:  Intact without significant lesions or rashes. Psych:  Alert and cooperative. Normal mood and affect.  Intake/Output from previous day: 12/10 0701 - 12/11 0700 In: 436.5 [P.O.:60; I.V.:376.5] Out: 225 [Urine:225] Intake/Output this shift: Total I/O In: 60 [P.O.:60] Out: -   Lab Results: Recent Labs     10/30/18 0740 10/31/18 2028 11/01/18 0904  WBC 3.9* 2.2* 0.8*  HGB 9.3* 7.7* 6.6*  HCT 28.5* 24.2* 20.9*  PLT 203 119* 86*   BMET Recent Labs    10/30/18 0740 10/31/18 2028 11/01/18 0904  NA 144 144 144  K 3.7 3.6 3.8  CL 104 106 110  CO2 _0 GLUCOSE 100* 103* 88  BUN 37* 44* 36*  CREATININE 2.21* 2.17* 1.82*  CALCIUM 9.3 8.6* 8.3*   LFT Recent Labs    10/31/18 2028  PROT 6.5  ALBUMIN 3.1*  AST 15  ALT 12  ALKPHOS 40  BILITOT 1.0   Studies/Results: Ct Abdomen Pelvis Wo Contrast  Result Date: 10/31/2018 CLINICAL DATA:  52 year old male with abdominal pain. Concern for acute appendicitis. History of right lung mass. EXAM: CT ABDOMEN AND PELVIS WITHOUT CONTRAST TECHNIQUE: Multidetector CT imaging of the abdomen and pelvis was performed following the standard protocol without IV contrast. COMPARISON:  PET-CT dated 08/21/2018 FINDINGS: Evaluation of this exam is limited in the absence of intravenous contrast. Lower chest: Bibasilar subpleural interstitial coarsening/scarring or atelectasis. Developing infiltrate at the right lung base is less likely but not excluded. Coronary vascular calcification. There is hypoattenuation of the cardiac blood pool suggestive of a degree of anemia. Clinical correlation is recommended. No intra-abdominal free air or free fluid. Hepatobiliary: No focal liver abnormality is seen. No gallstones, gallbladder wall thickening, or biliary dilatation. Pancreas: Unremarkable. No pancreatic ductal dilatation or surrounding inflammatory changes. Spleen: Normal in size without focal abnormality. Adrenals/Urinary Tract: The adrenal glands are unremarkable. There is no hydronephrosis or nephrolithiasis on either side. A 9 mm exophytic hypodense lesion from the lateral inferior pole of the right kidney is not well characterized but may represent a cyst. The visualized ureters and urinary bladder appear unremarkable. Stomach/Bowel: There is no bowel  obstruction or active inflammation. Normal appendix. Vascular/Lymphatic: Moderate aortoiliac atherosclerotic disease. No portal venous gas. There is no  adenopathy. Reproductive: The prostate and seminal vesicles are grossly unremarkable. No pelvic mass. Other: Small fat containing umbilical hernia. Musculoskeletal: No acute or significant osseous findings. IMPRESSION: No acute intra-abdominal or pelvic pathology. No bowel obstruction or active inflammation. Normal appendix. Electronically Signed   By: Anner Crete M.D.   On: 10/31/2018 23:21   Dg Chest Port 1 View  Result Date: 10/31/2018 CLINICAL DATA:  History of lung cancer, chills EXAM: PORTABLE CHEST 1 VIEW COMPARISON:  10/22/2018, 10/08/2018, CT chest 10/20/2018 FINDINGS: Left-sided central venous port tip over the SVC. No acute consolidation or effusion. Stable cardiomediastinal silhouette. No pneumothorax. IMPRESSION: No active disease. Electronically Signed   By: Donavan Foil M.D.   On: 10/31/2018 21:09   IMPRESSION:  72. 52 yo male with small cell lung cancer presented with right paratracheal mass and right suprahilar mass. Undergoing chemotherapy / radiation.  2. Odynophagia / dysphagia / anorexia/ vomiting sometimes with blood. May be radiation esophagitis but also ? Extrinsic compression of esophagus from adenopathy.  He had made significant improvement with the magic mouthwash during last admission so no EGD was performed.  3. Pancytopenia:  From chemo.  Hgb 6.6 grams this AM so I getting 2 units PRBC's.  WBC count 0.8 this AM and platelets 86.  4.  CAD on ASA 81 mg daily and Plavix.  5.  DVT prophylaxis:  On Heparin TID.   PLAN: -Continue pantoprazole 40 mg daily for now. -Will plan for EGD on 12/12.  Will hold AM dose of Plavix as well as heparin injection. -Continue nystatin suspension for now. -Agree with transfusion of PRBC's.   Andrew West  11/01/2018, 11:06 AM  I have reviewed the entire case in detail with  the above APP and discussed the plan in detail.  Therefore, I agree with the diagnoses recorded above. In addition,  I have personally interviewed and examined the patient and have personally reviewed any abdominal/pelvic CT scan images.  My additional thoughts are as follows:  Severe and steadily worsening dysphagia and odynophagia with reported intermittent small-volume hematemesis inpatient undergoing chemotherapy and radiation for small cell lung cancer.  His recent albumin was 3.1, no pre-albumin has been checked.  From speaking with him and his wife, it sounds like he has taken no meaningful nutrition by mouth, including liquid protein calorie supplements, over the last week.  Dysphagia and odynophagia seem to preclude oral intake for him at this time other than some small amount of clear liquids.   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  He is now immune compromised with severe pancytopenia, so we must also consider opportunistic viral infection such as CMV and HSV.  My original plan was upper endoscopy tomorrow, but I have now decided to cancel that procedure.  The severe pancytopenia and dual antiplatelet therapy make this a high risk procedure where I would not be able to biopsy any abnormal findings.  For now, I have prescribed combination Maalox/viscous lidocaine to take 4 times a day as needed.  Even if that is marginally successful reducing his symptoms to allow some increased oral intake, I doubt he will be able to meet his caloric needs. I strongly suggest that TPN be started on this patient.  To that end, I have ordered pre-albumin, ionized calcium, magnesium and phosphorus with tomorrow morning's labs.  Lastly, although this patient has severe multivessel coronary disease, which appears to be the indication for his dual antiplatelet therapy, he has progressive severe thrombocytopenia from recent chemotherapy.  Strong consideration should  be given to temporarily discontinuing his  aspirin and Plavix.  I will leave this decision to his primary team.  We will follow the patient and reevaluate when it might be a good time for him to have upper endoscopy.  Nelida Meuse III Office:[phone]

## 2018-11-01 NOTE — Progress Notes (Signed)
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  Assessment & Plan:   Active Problems:   Diabetes mellitus (HCC)   CAD S/P multiple PCI's   AKI (acute kidney injury) (HCC)   #) Dysphagia/nausea/vomiting: Patient continues to report dysphagia that he primarily points to underneath his thyroid cartilage.  He reports immediately after eating "throwing up" though this seems to be largely regurgitation.  He also continues to report a cough.  His p.o. intake is declined dramatically since starting chemotherapy 3 months ago.  He has not had any diarrhea.  He had a similar admission on 10/08/2018 to 10/11/2018 but at that time had dramatically improved with topical treatment and so declined an EGD.  Cheree Ditto on 10/09/2018 showed narrowing of the proximal esophagus. -N.p.o. -IV fluids -[place] consult for EGD  #) Acute on chronic stage III CKD: Baseline creatinine appears to be around 1.6. -IV fluids per above -Avoid nephrotoxins  #) Hypertension/hyperlipidemia: -Continue him zotepine 10 mg daily - Continue isosorbide mononitrate 120 mg daily -Continue nebivolol 20 mg daily - Continue rosuvastatin 20 mg daily-continue hydralazine 25 mg twice daily  #) Coronary artery disease status post stent: Patient apparently is no longer a candidate for  any nonsurgical interventions. -Continue aspirin 81 mg daily -Continue clopidogrel 75 mg daily -Continue beta-blocker, long-acting nitrate - Continue statin -Continue ranolazine thousand milligrams twice daily  #) COPD: -Continue L AMA/ICS/LABA -Continue PRN short-acting bronchodilators  #) Small cell lung cancer: Patient apparently is completed 2 cycles of cisplatin and and etoposide as well as radiotherapy. - We will consult oncology, Dr. Shirline Frees  #) BPH: -Continue prazosin  #) Pain/psych: -Continue dronabinol -Continue fluoxetine 40 mg daily  Fluids: Gentle IV fluids Electrolytes: Monitor and supplement Nutrition: N.p.o. currently, nutrition consult next Prophylaxis: Subcu heparin  Disposition: Pending GI evaluation   Full code    Consultants:   [facility]  Procedures:c)  None  Antimicrobials:   None   Subjective: Patient reports he is doing well today.  He continues to report odynophagia and dysphasia particularly with solids but including liquids as well.  He continues to report episodes of what he says is vomiting but reports it occurs shortly after eating and sounds more consistent with regurgitation.  He denies any chest pain, diarrhea, fevers, cough, congestion, rhinorrhea.  Objective: Vitals:   11/01/18 0223 11/01/18 0453 11/01/18 0938 11/01/18 1030  BP: 110/87 119/85  115/77  Pulse: 91 90  77  Resp: 18 16    Temp: 99.2 F (37.3 C) 99.9 F (37.7 C)    TempSrc: Oral Oral    SpO2: 100% 96% 90%   Weight:      Height:        Intake/Output Summary (Last 24 hours) at 11/01/2018 1113 Last data filed at 11/01/2018 1000 Gross per 24 hour  Intake 496.53 ml  Output 225 ml  Net 271.53 ml   Filed Weights   10/31/18  2013  Weight: 95 kg    Examination:  General exam: Appears calm and comfortable  Respiratory system: Clear to auscultation. Respiratory effort normal. Cardiovascular system: Regular rate and rhythm, no  murmurs Gastrointestinal system: Soft, nondistended, no rebound or guarding, plus bowel sounds Central nervous system: Alert and oriented.  Grossly intact, moving all extremities Extremities: No lower extremity edema. Skin: Port site is clean dry and intact Psychiatry: Judgement and insight appear normal. Mood & affect appropriate.     Data Reviewed: I have personally reviewed following labs and imaging studies  CBC: Recent Labs  Lab 10/30/18 0740 10/31/18 2028 11/01/18 0904  WBC 3.9* 2.2* 0.8*  NEUTROABS 3.7 1.9  --   HGB 9.3* 7.7* 6.6*  HCT 28.5* 24.2* 20.9*  MCV 81.7 84.6 85.7  PLT 203 119* 86*   Basic Metabolic Panel: Recent Labs  Lab 10/30/18 0740 10/31/18 2028 11/01/18 0904  NA 144 144 144  K 3.7 3.6 3.8  CL 104 106 110  CO2 26 25 25   GLUCOSE 100* 103* 88  BUN 37* 44* 36*  CREATININE 2.21* 2.17* 1.82*  CALCIUM 9.3 8.6* 8.3*  MG 1.8  --   --    GFR: Estimated Creatinine Clearance: 54 mL/min (A) (by C-G formula based on SCr of 1.82 mg/dL (H)). Liver Function Tests: Recent Labs  Lab 10/30/18 0740 10/31/18 2028  AST 13* 15  ALT 9 12  ALKPHOS 52 40  BILITOT 0.7 1.0  PROT 6.8 6.5  ALBUMIN 3.1* 3.1*   No results for input(s): LIPASE, AMYLASE in the last 168 hours. No results for input(s): AMMONIA in the last 168 hours. Coagulation Profile: No results for input(s): INR, PROTIME in the last 168 hours. Cardiac Enzymes: No results for input(s): CKTOTAL, CKMB, CKMBINDEX, TROPONINI in the last 168 hours. BNP (last 3 results) No results for input(s): PROBNP in the last 8760 hours. HbA1C: No results for input(s): HGBA1C in the last 72 hours. CBG: Recent Labs  Lab 11/01/18 0719  GLUCAP 86   Lipid Profile: No results for input(s): CHOL, HDL, LDLCALC, TRIG, CHOLHDL, LDLDIRECT in the last 72 hours. Thyroid Function Tests: No results for input(s): TSH, T4TOTAL, FREET4, T3FREE, THYROIDAB in the last 72 hours. Anemia Panel: No results for input(s):  VITAMINB12, FOLATE, FERRITIN, TIBC, IRON, RETICCTPCT in the last 72 hours. Sepsis Labs: Recent Labs  Lab 10/31/18 2146  LATICACIDVEN 0.75    Recent Results (from the past 240 hour(s))  Blood culture (routine x 2)     Status: None (Preliminary result)   Collection Time: 10/31/18  8:29 PM  Result Value Ref Range Status   Specimen Description   Final    BLOOD RIGHT HAND Performed at [facility], [address] [place], [place] [address]    Special Requests   Final    BOTTLES DRAWN AEROBIC AND ANAEROBIC Blood Culture results may not be optimal due to an excessive volume of blood received in culture bottles Performed at [facility], [address] [place], [place] [address]    Culture PENDING  Incomplete   Report Status PENDING  Incomplete  Blood culture (routine x 2)     Status: None (Preliminary result)   Collection Time: 10/31/18  8:34 PM  Result Value Ref Range Status   Specimen Description   Final    BLOOD LEFT HAND Performed at [facility], [address] [place], [place] [address]    Special Requests   Final    BOTTLES DRAWN  AEROBIC AND ANAEROBIC Blood Culture results may not be optimal due to an excessive volume of blood received in culture bottles Performed at [facility], [address] [place], [place] [address]    Culture PENDING  Incomplete   Report Status PENDING  Incomplete         Radiology Studies: Ct Abdomen Pelvis Wo Contrast  Result Date: 10/31/2018 CLINICAL DATA:  52 year old male with abdominal pain. Concern for acute appendicitis. History of right lung mass. EXAM: CT ABDOMEN AND PELVIS WITHOUT CONTRAST TECHNIQUE: Multidetector CT imaging of the abdomen and pelvis was performed following the standard protocol without IV contrast. COMPARISON:  PET-CT dated 08/21/2018 FINDINGS: Evaluation of this exam is limited in the absence of intravenous contrast. Lower chest: Bibasilar subpleural interstitial  coarsening/scarring or atelectasis. Developing infiltrate at the right lung base is less likely but not excluded. Coronary vascular calcification. There is hypoattenuation of the cardiac blood pool suggestive of a degree of anemia. Clinical correlation is recommended. No intra-abdominal free air or free fluid. Hepatobiliary: No focal liver abnormality is seen. No gallstones, gallbladder wall thickening, or biliary dilatation. Pancreas: Unremarkable. No pancreatic ductal dilatation or surrounding inflammatory changes. Spleen: Normal in size without focal abnormality. Adrenals/Urinary Tract: The adrenal glands are unremarkable. There is no hydronephrosis or nephrolithiasis on either side. A 9 mm exophytic hypodense lesion from the lateral inferior pole of the right kidney is not well characterized but may represent a cyst. The visualized ureters and urinary bladder appear unremarkable. Stomach/Bowel: There is no bowel obstruction or active inflammation. Normal appendix. Vascular/Lymphatic: Moderate aortoiliac atherosclerotic disease. No portal venous gas. There is no adenopathy. Reproductive: The prostate and seminal vesicles are grossly unremarkable. No pelvic mass. Other: Small fat containing umbilical hernia. Musculoskeletal: No acute or significant osseous findings. IMPRESSION: No acute intra-abdominal or pelvic pathology. No bowel obstruction or active inflammation. Normal appendix. Electronically Signed   By: Elgie Collard M.D.   On: 10/31/2018 23:21   Dg Chest Port 1 View  Result Date: 10/31/2018 CLINICAL DATA:  History of lung cancer, chills EXAM: PORTABLE CHEST 1 VIEW COMPARISON:  10/22/2018, 10/08/2018, CT chest 10/20/2018 FINDINGS: Left-sided central venous port tip over the SVC. No acute consolidation or effusion. Stable cardiomediastinal silhouette. No pneumothorax. IMPRESSION: No active disease. Electronically Signed   By: Jasmine Pang M.D.   On: 10/31/2018 21:09        Scheduled Meds: .  sodium chloride   Intravenous Once  . aspirin  81 mg Oral Daily  . clopidogrel  75 mg Oral Q breakfast  . dronabinol  5 mg Oral BID AC  . feeding supplement  1 Container Oral TID BM  . FLUoxetine  40 mg Oral Daily  . heparin  5,000 Units Subcutaneous Q8H  . insulin aspart  0-9 Units Subcutaneous TID WC  . isosorbide mononitrate  120 mg Oral Daily  . mirtazapine  30 mg Oral QHS  . mometasone-formoterol  2 puff Inhalation BID  . nebivolol  20 mg Oral Daily  . nystatin  5 mL Oral QID  . pantoprazole  40 mg Oral Daily  . prazosin  1 mg Oral QHS  . ranolazine  1,000 mg Oral BID  . rosuvastatin  20 mg Oral Daily  . sodium chloride flush  10-40 mL Intracatheter Q12H  . tiotropium  18 mcg Inhalation Daily   Continuous Infusions: . 0.45 % NaCl with KCl 20 mEq / L 125 mL/hr (11/01/18 1032)     LOS: 0 days  Time spent: 35    Delaine Lame, MD [facility]  If 7PM-7AM, please contact night-coverage [email or web address] Password [email or web address] 11/01/2018, 11:13 AM

## 2018-11-01 NOTE — ED Notes (Signed)
Pt asked again at this time to provide urine sample. Pt refusing at this time.

## 2018-11-02 ENCOUNTER — Inpatient Hospital Stay: Payer: Medicare Other

## 2018-11-02 ENCOUNTER — Inpatient Hospital Stay (HOSPITAL_COMMUNITY): Payer: Medicare Other

## 2018-11-02 ENCOUNTER — Encounter (HOSPITAL_COMMUNITY)
Admission: EM | Discharge status: Against Medical Advice | Payer: Self-pay | Source: Home / Self Care | Attending: Internal Medicine

## 2018-11-02 LAB — URINALYSIS, ROUTINE W REFLEX MICROSCOPIC
Bilirubin Urine: NEGATIVE
Glucose, UA: NEGATIVE mg/dL
Hgb urine dipstick: NEGATIVE
Ketones, ur: NEGATIVE mg/dL
Leukocytes, UA: NEGATIVE
Nitrite: NEGATIVE
Protein, ur: NEGATIVE mg/dL
Specific Gravity, Urine: 1.016 (ref 1.005–1.030)
pH: 5 (ref 5.0–8.0)

## 2018-11-02 LAB — BASIC METABOLIC PANEL
Anion gap: 8 (ref 5–15)
CO2: 24 mmol/L (ref 22–32)
Calcium: 8.2 mg/dL — ABNORMAL LOW (ref 8.9–10.3)
Chloride: 111 mmol/L (ref 98–111)
Creatinine, Ser: 2 mg/dL — ABNORMAL HIGH (ref 0.61–1.24)
GFR calc Af Amer: 43 mL/min — ABNORMAL LOW (ref >60)
GFR calc non Af Amer: 37 mL/min — ABNORMAL LOW (ref >60)
Glucose, Bld: 85 mg/dL (ref 70–99)
Potassium: 4.1 mmol/L (ref 3.5–5.1)
Sodium: 143 mmol/L (ref 135–145)

## 2018-11-02 LAB — MAGNESIUM: Magnesium: 1.8 mg/dL (ref 1.7–2.4)

## 2018-11-02 LAB — CBC
HCT: 24.1 % — ABNORMAL LOW (ref 39.0–52.0)
Hemoglobin: 7.8 g/dL — ABNORMAL LOW (ref 13.0–17.0)
MCH: 28.5 pg (ref 26.0–34.0)
MCHC: 32.4 g/dL (ref 30.0–36.0)
MCV: 88 fL (ref 80.0–100.0)
Platelets: 48 10*3/uL — ABNORMAL LOW (ref 150–400)
RBC: 2.74 MIL/uL — ABNORMAL LOW (ref 4.22–5.81)
RDW: 13.9 % (ref 11.5–15.5)
WBC: 0.3 10*3/uL — CL (ref 4.0–10.5)
nRBC: 0 % (ref 0.0–0.2)

## 2018-11-02 LAB — GLUCOSE, CAPILLARY
Glucose-Capillary: 80 mg/dL (ref 70–99)
Glucose-Capillary: 106 mg/dL — ABNORMAL HIGH (ref 70–99)
Glucose-Capillary: 77 mg/dL (ref 70–99)
Glucose-Capillary: 79 mg/dL (ref 70–99)

## 2018-11-02 LAB — BASIC METABOLIC PANEL WITH GFR: BUN: 30 mg/dL — ABNORMAL HIGH (ref 6–20)

## 2018-11-02 LAB — PHOSPHORUS: PHOSPHORUS: 2.7 mg/dL (ref 2.5–4.6)

## 2018-11-02 LAB — PREALBUMIN: Prealbumin: 15.3 mg/dL — ABNORMAL LOW (ref 18–38)

## 2018-11-02 LAB — PROCALCITONIN: Procalcitonin: 0.23 ng/mL

## 2018-11-02 LAB — MRSA PCR SCREENING: MRSA by PCR: NEGATIVE

## 2018-11-02 SURGERY — ESOPHAGOGASTRODUODENOSCOPY (EGD) WITH PROPOFOL
Anesthesia: Monitor Anesthesia Care

## 2018-11-02 MED ORDER — CLOPIDOGREL BISULFATE 75 MG PO TABS: 75.0000 mg | ORAL_TABLET | Freq: Every day | ORAL | Status: DC

## 2018-11-02 MED ORDER — SODIUM CHLORIDE 0.9 % IV SOLN: 1.0000 g | Freq: Three times a day (TID) | INTRAVENOUS | Status: DC

## 2018-11-02 MED ORDER — HYDROMORPHONE HCL 1 MG/ML IJ SOLN
0.5000 mg | INTRAMUSCULAR | Status: DC | PRN
  Administered 2018-11-02 – 2018-11-08 (×21): 0.5 mg via INTRAVENOUS
  Filled 2018-11-02 (×22): qty 0.5

## 2018-11-02 MED ORDER — MAGIC MOUTHWASH W/LIDOCAINE
10.0000 mL | Freq: Four times a day (QID) | ORAL | Status: DC | PRN
  Administered 2018-11-03: 10 mL via ORAL
  Filled 2018-11-02 (×2): qty 10

## 2018-11-02 MED ORDER — SODIUM CHLORIDE 0.9 % IV SOLN
2.0000 g | Freq: Two times a day (BID) | INTRAVENOUS | Status: DC
  Administered 2018-11-02 – 2018-11-08 (×13): 2 g via INTRAVENOUS
  Filled 2018-11-02 (×14): qty 2

## 2018-11-02 MED ORDER — SODIUM CHLORIDE 0.9 % IV SOLN
INTRAVENOUS | Status: AC
  Administered 2018-11-02 – 2018-11-03 (×3): via INTRAVENOUS

## 2018-11-02 NOTE — Progress Notes (Signed)
[facility] Progress Note  CC:  Dysphagia/odynophagia  Subjective:  Feels ok.  Just got cleaned up.  Says that he wants to get up and walk around.  Wants to try some soft food.  Objective:  Vital signs in last 24 hours: Temp:  [98.6 F (37 C)-100.6 F (38.1 C)] 100.6 F (38.1 C) (12/12 0501) Pulse Rate:  [77-95] 95 (12/12 0501) Resp:  [14-18] 18 (12/12 0501) BP: (72-121)/(52-78) 121/78 (12/12 0501) SpO2:  [90 %-100 %] 96 % (12/12 0700) Last BM Date: 10/31/18 General:  Alert, Well-developed, in NAD Heart:  Regular rate and rhythm; no murmurs Pulm:  CTAB.  No increased WOB. Abdomen:  Soft, non-distended.  BS present.  Mild right sided TTP. Extremities:  Without edema. Neurologic:  Alert and oriented x 4;  grossly normal neurologically. Psych:  Alert and cooperative. Normal mood and affect.  Intake/Output from previous day: 12/11 0701 - 12/12 0700 In: 738.8 [P.O.:120; I.V.:10; Blood:608.8] Out: 675 [Urine:675]  Lab Results: Recent Labs    10/31/18 2028 11/01/18 0904 11/02/18 0321  WBC 2.2* 0.8* 0.3*  HGB 7.7* 6.6* 7.8*  HCT 24.2* 20.9* 24.1*  PLT 119* 86* 48*   BMET Recent Labs    10/31/18 2028 11/01/18 0904 11/02/18 0321  NA 144 144 143  K 3.6 3.8 4.1  CL 106 110 111  CO2 25 25 24   GLUCOSE 103* 88 85  BUN 44* 36* 30*  CREATININE 2.17* 1.82* 2.00*  CALCIUM 8.6* 8.3* 8.2*   LFT Recent Labs    10/31/18 2028  PROT 6.5  ALBUMIN 3.1*  AST 15  ALT 12  ALKPHOS 40  BILITOT 1.0   Ct Abdomen Pelvis Wo Contrast  Result Date: 10/31/2018 CLINICAL DATA:  52 year old male with abdominal pain. Concern for acute appendicitis. History of right lung mass. EXAM: CT ABDOMEN AND PELVIS WITHOUT CONTRAST TECHNIQUE: Multidetector CT imaging of the abdomen and pelvis was performed following the standard protocol without IV contrast. COMPARISON:  PET-CT dated 08/21/2018 FINDINGS: Evaluation of this exam is limited in the absence of intravenous contrast.  Lower chest: Bibasilar subpleural interstitial coarsening/scarring or atelectasis. Developing infiltrate at the right lung base is less likely but not excluded. Coronary vascular calcification. There is hypoattenuation of the cardiac blood pool suggestive of a degree of anemia. Clinical correlation is recommended. No intra-abdominal free air or free fluid. Hepatobiliary: No focal liver abnormality is seen. No gallstones, gallbladder wall thickening, or biliary dilatation. Pancreas: Unremarkable. No pancreatic ductal dilatation or surrounding inflammatory changes. Spleen: Normal in size without focal abnormality. Adrenals/Urinary Tract: The adrenal glands are unremarkable. There is no hydronephrosis or nephrolithiasis on either side. A 9 mm exophytic hypodense lesion from the lateral inferior pole of the right kidney is not well characterized but may represent a cyst. The visualized ureters and urinary bladder appear unremarkable. Stomach/Bowel: There is no bowel obstruction or active inflammation. Normal appendix. Vascular/Lymphatic: Moderate aortoiliac atherosclerotic disease. No portal venous gas. There is no adenopathy. Reproductive: The prostate and seminal vesicles are grossly unremarkable. No pelvic mass. Other: Small fat containing umbilical hernia. Musculoskeletal: No acute or significant osseous findings. IMPRESSION: No acute intra-abdominal or pelvic pathology. No bowel obstruction or active inflammation. Normal appendix. Electronically Signed   By: Anner Crete M.D.   On: 10/31/2018 23:21   Dg Chest Port 1 View  Result Date: 11/02/2018 CLINICAL DATA:  52 year old current history of small-cell lung cancer for which the patient is being treated with chemotherapy and radiation therapy, presenting with acute  onset of fever. EXAM: PORTABLE CHEST 1 VIEW COMPARISON:  11/02/2018 and earlier, including CT chest 10/20/2018 and PET-CT 08/21/2018. FINDINGS: Cardiac silhouette normal in size for AP technique.  Thoracic aorta mildly tortuous and atherosclerotic, unchanged. RIGHT paratracheal mediastinal lymphadenopathy identified on prior examinations has resolved. LEFT jugular Port-A-Cath tip projects over the mid SVC, unchanged. Patchy airspace opacities at the RIGHT lung base. Lungs otherwise clear. Pulmonary vascularity normal. IMPRESSION: Acute pneumonia involving the RIGHT lung base. Electronically Signed   By: Evangeline Dakin M.D.   On: 11/02/2018 08:09   Dg Chest Port 1 View  Result Date: 10/31/2018 CLINICAL DATA:  History of lung cancer, chills EXAM: PORTABLE CHEST 1 VIEW COMPARISON:  10/22/2018, 10/08/2018, CT chest 10/20/2018 FINDINGS: Left-sided central venous port tip over the SVC. No acute consolidation or effusion. Stable cardiomediastinal silhouette. No pneumothorax. IMPRESSION: No active disease. Electronically Signed   By: Donavan Foil M.D.   On: 10/31/2018 21:09    Assessment / Plan: 32. 52 yo male with small cell lung cancer presented with right paratracheal mass and right suprahilar mass. Undergoing chemotherapy / radiation.  2. Odynophagia / dysphagia / anorexia / vomiting sometimes with blood. May be radiation esophagitis but also ? Extrinsic compression of esophagus from adenopathy. Could also be opportunistic infection with CMV or HSV with neutropenia.  He had made significant improvement with the magic mouthwash during last admission so no EGD was performed.  3. Pancytopenia:  From chemo.  Hgb 6.6 grams this AM (received 2 units PRBC's with increase to 7.8 grams).  WBC count 0.3 this AM and platelets 48.  4.  CAD on ASA 81 mg daily and Plavix.  5.  DVT prophylaxis:  On Heparin TID.   PLAN: -Continue pantoprazole 40 mg daily for now. -I had discontinued his Plavix and heparin injections in anticipation for EGD, which has been cancelled for now until counts come up.  Will leave to the discretion of the hospitalist to restart these light of his severe  thrombocytopenia. -Viscous lidocaine/mylanta ordered and I have asked the nurse to give it to him before meals.  He would like to try this with a soft diet. -Monitor cell counts.   -Consider TNA as prealbumin is low.   LOS: 1 day   Laban Emperor.   11/02/2018, 9:16 AM

## 2018-11-02 NOTE — Progress Notes (Signed)
PHARMACY NOTE:  ANTIMICROBIAL RENAL DOSAGE ADJUSTMENT  Current antimicrobial regimen includes a mismatch between antimicrobial dosage and estimated renal function.  As per policy approved by the Pharmacy & Therapeutics and Medical Executive Committees, the antimicrobial dosage will be adjusted accordingly.  Current antimicrobial dosage:  Cefepime 1 gr IV q8h   Indication: febrile neutropenia   Renal Function:  Estimated Creatinine Clearance: 49.1 mL/min (A) (by C-G formula based on SCr of 2 mg/dL (H)). []      On intermittent HD, scheduled: []      On CRRT    Antimicrobial dosage has been changed to:  Cefepime 2 gr IV q12h   Additional comments:   Thank you for allowing pharmacy to be a part of this patient's care.   Royetta Asal, PharmD, BCPS Pager [phone] 11/02/2018 7:35 AM

## 2018-11-02 NOTE — Progress Notes (Signed)
 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  Assessment & Plan:   Active Problems:   Diabetes mellitus (HCC)   CAD S/P multiple PCI's   AKI (acute kidney injury) (HCC)  #) Neutropenic fever: Patient was noted to have a temperature 100.6 last night.  He does have some wheezing on exam however he has no other localizing signs or symptoms. -Start IV cefepime 11/02/2018 -Blood cultures ordered 11/02/2018 -Urine culture ordered 11/02/2018 -Chest x-ray ordered  #) Dysphagia/nausea/vomiting: Patient is doing somewhat better today.  GI initially was planning for an EGD however they are concerned about neutropenia and thrombocytopenia.  They would like his numbers to start increasing prior to perform EGD. -Regular diet -IV fluids for 1 day -[place] consult for EGD  #) Acute on chronic stage III CKD: Baseline creatinine appears to be around 1.6. -IV fluids per above -Avoid nephrotoxins  #) Pancytopenia: Currently patient is thrombocytopenic and likely neutropenic.  This is likely the nadir of his counts due to recent chemotherapy. -We will continue to monitor -Hold clopidogrel and subcutaneous prophylaxis while platelets are below 50  #) Hypertension/hyperlipidemia: -Continue him zotepine 10 mg daily - Continue isosorbide  mononitrate 120 mg daily -Continue nebivolol 20 mg daily - Continue rosuvastatin 20 mg daily-continue hydralazine 25 mg twice daily  #) Coronary artery disease status post stent: Patient apparently is no longer a candidate for any nonsurgical interventions. -Continue aspirin 81 mg daily -Hold clopidogrel 75 mg daily -Continue beta-blocker, long-acting nitrate - Continue statin -Continue ranolazine thousand milligrams twice daily  #) COPD: -Continue L AMA/ICS/LABA -Continue PRN short-acting bronchodilators  #) Small cell lung cancer: Patient apparently is completed 2 cycles of cisplatin and and etoposide as well as radiotherapy. - We will consult oncology, Dr. Shirline Frees  #) BPH: -Continue prazosin  #) Pain/psych: -Continue dronabinol -Continue fluoxetine 40 mg daily  Fluids: Gentle IV fluids Electrolytes: Monitor and supplement Nutrition: N.p.o. currently, nutrition consult   Prophylaxis: Thrombocytopenic  Disposition: Pending GI evaluation   Full code    Consultants:   [facility]  Procedures:c)  None  Antimicrobials:   IV cefepime started 11/02/2018   Subjective: Patient reports he is doing well today.  His symptoms are somewhat improved.  He initially was planned for an EGD however due to progressive thrombocytopenia and neutropenia they would like to hold off at this time.  He was also noted to be febrile last night.  Objective: Vitals:   11/01/18 2046 11/01/18 2103 11/02/18 0501 11/02/18 0700  BP: 104/70 106/67 121/78   Pulse: 83 83 95   Resp: 18 18 18    Temp: 99.8 F (37.7 C) 99.8 F (37.7 C) (!) 100.6 F (38.1 C)   TempSrc:      SpO2: 96% 94% 96% 96%  Weight:      Height:        Intake/Output Summary (Last 24 hours) at 11/02/2018 1017 Last data filed at  11/02/2018 1004 Gross per 24 hour  Intake 728.75 ml  Output 675 ml  Net 53.75 ml   Filed Weights   10/31/18 2013  Weight: 95 kg    Examination:  General exam:  Appears calm and comfortable  Respiratory system: Clear to auscultation. Respiratory effort normal.  Intermittent wheezes Cardiovascular system: Regular rate and rhythm, no murmurs Gastrointestinal system: Soft, nondistended, no rebound or guarding, plus bowel sounds Central nervous system: Alert and oriented.  Grossly intact, moving all extremities Extremities: No lower extremity edema. Skin: Port site is clean dry and intact Psychiatry: Judgement and insight appear normal. Mood & affect appropriate.     Data Reviewed: I have personally reviewed following labs and imaging studies  CBC: Recent Labs  Lab 10/30/18 0740 10/31/18 2028 11/01/18 0904 11/02/18 0321  WBC 3.9* 2.2* 0.8* 0.3*  NEUTROABS 3.7 1.9  --   --   HGB 9.3* 7.7* 6.6* 7.8*  HCT 28.5* 24.2* 20.9* 24.1*  MCV 81.7 84.6 85.7 88.0  PLT 203 119* 86* 48*   Basic Metabolic Panel: Recent Labs  Lab 10/30/18 0740 10/31/18 2028 11/01/18 0904 11/02/18 0321  NA 144 144 144 143  K 3.7 3.6 3.8 4.1  CL 104 106 110 111  CO2 26 25 25 24   GLUCOSE 100* 103* 88 85  BUN 37* 44* 36* 30*  CREATININE 2.21* 2.17* 1.82* 2.00*  CALCIUM 9.3 8.6* 8.3* 8.2*  MG 1.8  --   --  1.8  PHOS  --   --   --  2.7   GFR: Estimated Creatinine Clearance: 49.1 mL/min (A) (by C-G formula based on SCr of 2 mg/dL (H)). Liver Function Tests: Recent Labs  Lab 10/30/18 0740 10/31/18 2028  AST 13* 15  ALT 9 12  ALKPHOS 52 40  BILITOT 0.7 1.0  PROT 6.8 6.5  ALBUMIN 3.1* 3.1*   No results for input(s): LIPASE, AMYLASE in the last 168 hours. No results for input(s): AMMONIA in the last 168 hours. Coagulation Profile: No results for input(s): INR, PROTIME in the last 168 hours. Cardiac Enzymes: No results for input(s): CKTOTAL, CKMB, CKMBINDEX, TROPONINI in the last 168 hours. BNP (last 3 results) No results for input(s): PROBNP in the last 8760 hours. HbA1C: No results for input(s): HGBA1C in the last 72 hours. CBG: Recent Labs  Lab  11/01/18 0719 11/01/18 1214 11/01/18 1655 11/01/18 2051 11/02/18 0750  GLUCAP 86 82 99 82 80   Lipid Profile: No results for input(s): CHOL, HDL, LDLCALC, TRIG, CHOLHDL, LDLDIRECT in the last 72 hours. Thyroid Function Tests: No results for input(s): TSH, T4TOTAL, FREET4, T3FREE, THYROIDAB in the last 72 hours. Anemia Panel: No results for input(s): VITAMINB12, FOLATE, FERRITIN, TIBC, IRON, RETICCTPCT in the last 72 hours. Sepsis Labs: Recent Labs  Lab 10/31/18 2146 11/02/18 0910  PROCALCITON  --  0.23  LATICACIDVEN 0.75  --     Recent Results (from the past 240 hour(s))  Blood culture (routine x 2)     Status: None (Preliminary result)   Collection Time: 10/31/18  8:29 PM  Result Value Ref Range Status   Specimen Description   Final    BLOOD RIGHT HAND Performed at [facility], [address], [place], [place] [address]    Special Requests   Final    BOTTLES DRAWN AEROBIC AND ANAEROBIC Blood Culture results may not be optimal due to an excessive volume of blood received in culture bottles Performed at [facility], [address]  [place], [place] [address]    Culture PENDING  Incomplete   Report Status PENDING  Incomplete  Blood culture (routine x 2)     Status: None (Preliminary result)   Collection Time: 10/31/18  8:34 PM  Result Value Ref Range Status   Specimen Description   Final    BLOOD LEFT HAND Performed at [facility], [address], [place], [place] [address]    Special Requests   Final    BOTTLES DRAWN AEROBIC AND ANAEROBIC Blood Culture results may not be optimal due to an excessive volume of blood received in culture bottles Performed at [facility], [address] [place], [place] [address]    Culture PENDING  Incomplete   Report Status PENDING  Incomplete         Radiology Studies: Ct Abdomen Pelvis Wo Contrast  Result Date: 10/31/2018 CLINICAL DATA:  52 year old male with abdominal  pain. Concern for acute appendicitis. History of right lung mass. EXAM: CT ABDOMEN AND PELVIS WITHOUT CONTRAST TECHNIQUE: Multidetector CT imaging of the abdomen and pelvis was performed following the standard protocol without IV contrast. COMPARISON:  PET-CT dated 08/21/2018 FINDINGS: Evaluation of this exam is limited in the absence of intravenous contrast. Lower chest: Bibasilar subpleural interstitial coarsening/scarring or atelectasis. Developing infiltrate at the right lung base is less likely but not excluded. Coronary vascular calcification. There is hypoattenuation of the cardiac blood pool suggestive of a degree of anemia. Clinical correlation is recommended. No intra-abdominal free air or free fluid. Hepatobiliary: No focal liver abnormality is seen. No gallstones, gallbladder wall thickening, or biliary dilatation. Pancreas: Unremarkable. No pancreatic ductal dilatation or surrounding inflammatory changes. Spleen: Normal in size without focal abnormality. Adrenals/Urinary Tract: The adrenal glands are unremarkable. There is no hydronephrosis or nephrolithiasis on either side. A 9 mm exophytic hypodense lesion from the lateral inferior pole of the right kidney is not well characterized but may represent a cyst. The visualized ureters and urinary bladder appear unremarkable. Stomach/Bowel: There is no bowel obstruction or active inflammation. Normal appendix. Vascular/Lymphatic: Moderate aortoiliac atherosclerotic disease. No portal venous gas. There is no adenopathy. Reproductive: The prostate and seminal vesicles are grossly unremarkable. No pelvic mass. Other: Small fat containing umbilical hernia. Musculoskeletal: No acute or significant osseous findings. IMPRESSION: No acute intra-abdominal or pelvic pathology. No bowel obstruction or active inflammation. Normal appendix. Electronically Signed   By: Elgie Collard M.D.   On: 10/31/2018 23:21   Dg Chest Port 1 View  Result Date:  11/02/2018 CLINICAL DATA:  52 year old current history of small-cell lung cancer for which the patient is being treated with chemotherapy and radiation therapy, presenting with acute onset of fever. EXAM: PORTABLE CHEST 1 VIEW COMPARISON:  11/02/2018 and earlier, including CT chest 10/20/2018 and PET-CT 08/21/2018. FINDINGS: Cardiac silhouette normal in size for AP technique. Thoracic aorta mildly tortuous and atherosclerotic, unchanged. RIGHT paratracheal mediastinal lymphadenopathy identified on prior examinations has resolved. LEFT jugular Port-A-Cath tip projects over the mid SVC, unchanged. Patchy airspace opacities at the RIGHT lung base. Lungs otherwise clear. Pulmonary vascularity normal. IMPRESSION: Acute pneumonia involving the RIGHT lung base. Electronically Signed   By: Hulan Saas M.D.   On: 11/02/2018 08:09   Dg Chest Port 1 View  Result Date: 10/31/2018 CLINICAL DATA:  History of lung cancer, chills EXAM: PORTABLE CHEST 1 VIEW COMPARISON:  10/22/2018, 10/08/2018, CT chest 10/20/2018 FINDINGS: Left-sided central venous port tip over the SVC. No acute consolidation or effusion. Stable cardiomediastinal silhouette. No pneumothorax. IMPRESSION: No  active disease. Electronically Signed   By: Jasmine Pang M.D.   On: 10/31/2018 21:09        Scheduled Meds: . sodium chloride   Intravenous Once  . aspirin  81 mg Oral Daily  . dronabinol  5 mg Oral BID AC  . feeding supplement  1 Container Oral Q24H  . feeding supplement (ENSURE ENLIVE)  237 mL Oral BID BM  . FLUoxetine  40 mg Oral Daily  . insulin aspart  0-9 Units Subcutaneous TID WC  . isosorbide mononitrate  120 mg Oral Daily  . mirtazapine  30 mg Oral QHS  . mometasone-formoterol  2 puff Inhalation BID  . multivitamin with minerals  1 tablet Oral Daily  . nebivolol  20 mg Oral Daily  . nystatin  5 mL Oral QID  . pantoprazole  40 mg Oral Daily  . prazosin  1 mg Oral QHS  . ranolazine  1,000 mg Oral BID  . rosuvastatin  20  mg Oral Daily  . sodium chloride flush  10-40 mL Intracatheter Q12H  . tiotropium  18 mcg Inhalation Daily   Continuous Infusions: . sodium chloride 100 mL/hr at 11/02/18 0836  . ceFEPime (MAXIPIME) IV 2 g (11/02/18 0839)     LOS: 1 day    Time spent: 35    Delaine Lame, MD [facility]  If 7PM-7AM, please contact night-coverage [email or web address] Password [facility] 11/02/2018, 10:17 AM

## 2018-11-03 LAB — CALCIUM, IONIZED: Calcium, Ionized, Serum: 5.1 mg/dL (ref 4.5–5.6)

## 2018-11-03 LAB — CBC WITH DIFFERENTIAL/PLATELET
Abs Immature Granulocytes: 0 10*3/uL (ref 0.00–0.07)
Basophils Absolute: 0 10*3/uL (ref 0.0–0.1)
Basophils Relative: 0 %
Eosinophils Absolute: 0 10*3/uL (ref 0.0–0.5)
Eosinophils Relative: 0 %
HCT: 23.3 % — ABNORMAL LOW (ref 39.0–52.0)
Hemoglobin: 7.6 g/dL — ABNORMAL LOW (ref 13.0–17.0)
Immature Granulocytes: 0 %
Lymphocytes Relative: 53 %
Lymphs Abs: 0.1 10*3/uL — ABNORMAL LOW (ref 0.7–4.0)
MCH: 27.8 pg (ref 26.0–34.0)
MCHC: 32.6 g/dL (ref 30.0–36.0)
MCV: 85.3 fL (ref 80.0–100.0)
Monocytes Absolute: 0 10*3/uL — ABNORMAL LOW (ref 0.1–1.0)
Monocytes Relative: 8 %
Neutro Abs: 0.1 10*3/uL — ABNORMAL LOW (ref 1.7–7.7)
Neutrophils Relative %: 39 %
Platelets: 19 10*3/uL — CL (ref 150–400)
RBC: 2.73 MIL/uL — ABNORMAL LOW (ref 4.22–5.81)
RDW: 14 % (ref 11.5–15.5)
WBC: 0.1 10*3/uL — CL (ref 4.0–10.5)
nRBC: 0 % (ref 0.0–0.2)

## 2018-11-03 LAB — URINE CULTURE: Culture: <10000 — AB

## 2018-11-03 LAB — GLUCOSE, CAPILLARY
Glucose-Capillary: 86 mg/dL (ref 70–99)
Glucose-Capillary: 79 mg/dL (ref 70–99)
Glucose-Capillary: 83 mg/dL (ref 70–99)
Glucose-Capillary: 96 mg/dL (ref 70–99)

## 2018-11-03 LAB — COMPREHENSIVE METABOLIC PANEL
ALT: 16 U/L (ref 0–44)
AST: 16 U/L (ref 15–41)
Albumin: 2.3 g/dL — ABNORMAL LOW (ref 3.5–5.0)
Alkaline Phosphatase: 40 U/L (ref 38–126)
Anion gap: 8 (ref 5–15)
BUN: 26 mg/dL — ABNORMAL HIGH (ref 6–20)
CO2: 23 mmol/L (ref 22–32)
Calcium: 8.1 mg/dL — ABNORMAL LOW (ref 8.9–10.3)
Chloride: 113 mmol/L — ABNORMAL HIGH (ref 98–111)
Creatinine, Ser: 1.93 mg/dL — ABNORMAL HIGH (ref 0.61–1.24)
GFR calc Af Amer: 45 mL/min — ABNORMAL LOW (ref >60)
GFR calc non Af Amer: 39 mL/min — ABNORMAL LOW (ref >60)
Glucose, Bld: 84 mg/dL (ref 70–99)
Potassium: 4 mmol/L (ref 3.5–5.1)
Sodium: 144 mmol/L (ref 135–145)
Total Bilirubin: 1 mg/dL (ref 0.3–1.2)

## 2018-11-03 LAB — COMPREHENSIVE METABOLIC PANEL WITH GFR: Total Protein: 5.4 g/dL — ABNORMAL LOW (ref 6.5–8.1)

## 2018-11-03 LAB — PROCALCITONIN: Procalcitonin: 0.58 ng/mL

## 2018-11-03 MED ORDER — OXYCODONE HCL 5 MG/5ML PO SOLN
10.0000 mg | ORAL | Status: DC | PRN
  Administered 2018-11-03 – 2018-11-05 (×4): 10 mg via ORAL
  Filled 2018-11-03 (×7): qty 10

## 2018-11-03 MED ORDER — DEXTROSE 10 % IV SOLN
INTRAVENOUS | Status: AC
  Administered 2018-11-03: 40 mL via INTRAVENOUS
  Filled 2018-11-03: qty 1000

## 2018-11-03 MED ORDER — INSULIN ASPART 100 UNIT/ML ~~LOC~~ SOLN: 0.0000 [IU] | SUBCUTANEOUS | Status: DC

## 2018-11-03 NOTE — Progress Notes (Signed)
CRITICAL VALUE ALERT  Critical Value:  PLT: 19  Date & Time Notied:11/03/18 @ 0459  Provider Notified: Lamar Blinks, NP  Orders Received/Actions taken: text page sent, awaiting response/orders.

## 2018-11-03 NOTE — Progress Notes (Signed)
PROGRESS NOTE    Andrew West  GMW:[id number] DOB: 23-Sep-1966 DOA: 10/31/2018 PCP: [name], MD   Brief Narrative:  52 year old with past medical history relevant for small cell lung cancer on chemotherapy and radiation (radiation 1 week ago, chemotherapy 3 weeks ago), COPD, BPH, stage III CKD, grade 1 diastolic dysfunction by echo on 02/26/2018, coronary artery disease status post stents (multiple vessel disease by cardiac catheterization on 12/16/2016, felt to be poor candidate for further intervention except for possibly CABG), who presents with dysphasia thought to be secondary to a combination of radiation esophagitis and/or possible extrinsic compression of esophagus from adenopathy and AKI.  Assessment & Plan:   Active Problems:   Diabetes mellitus (HCC)   CAD S/P multiple PCI's   AKI (acute kidney injury) (HCC)  #) Pneumonia/neutropenic fever: Patient was noted to be febrile on 11/01/2018.  Chest x-ray showed right lower lobe pneumonia. -Continue IV cefepime 11/02/2018 -Blood cultures  11/02/2018 no growth to date -Urine culture  11/02/2018 no growth to date  #) Dysphagia/nausea/vomiting: Patient has not improved much.  Unfortunately patient is not a candidate for an EGD with biopsies right now due to his pancytopenia and active infection.  Reportedly GI is considering EGD once his counts are recovering. -Regular diet by mouth -We will discuss with patient's oncologist Dr. Shirline Frees about placement of core track versus TPN -[facility] following, pending EGD  #) Acute on chronic stage III CKD: Improving -Avoid nephrotoxins  #) Pancytopenia: Currently patient is thrombocytopenic and likely neutropenic.   -We will continue to monitor -Hold clopidogrel and subcutaneous prophylaxis while platelets are below 50  #) Hypertension/hyperlipidemia: -Continue him zotepine 10 mg daily - Continue isosorbide mononitrate 120 mg daily -Continue nebivolol 20 mg  daily - Continue rosuvastatin 20 mg daily-continue hydralazine 25 mg twice daily  #) Coronary artery disease status post stent: Patient apparently is no longer a candidate for any nonsurgical interventions. -Continue aspirin 81 mg daily -Hold clopidogrel 75 mg daily -Continue beta-blocker, long-acting nitrate - Continue statin -Continue ranolazine thousand milligrams twice daily  #) COPD: -Continue L AMA/ICS/LABA -Continue PRN short-acting bronchodilators  #) Small cell lung cancer: Patient apparently is completed 2 cycles of cisplatin and and etoposide as well as radiotherapy. - We will consult oncology, Dr. Shirline Frees  #) BPH: -Continue prazosin  #) Pain/psych: -Continue dronabinol -Continue fluoxetine 40 mg daily  Fluids: Discontinue IV fluids Electrolytes: Monitor and supplement Nutrition: Regular diet, pending discussion of TPN versus tube feeds  Prophylaxis: Thrombocytopenic  Disposition: Pending resolution of pneumonia and recovery of counts for EGD   Full code    Consultants:   [facility]  Procedures:c)  None  Antimicrobials:   IV cefepime started 11/02/2018   Subjective: Patient reports that he is not much improved.  He reports that the topical numbing agents did not help him swallow any.  He denies any nausea, vomiting, diarrhea.  He continues to report throat pain.  He has not had any fevers.  Objective: Vitals:   11/02/18 1410 11/02/18 2057 11/02/18 2146 11/03/18 0528  BP: 112/75 (!) 99/49  128/74  Pulse: 91 94  90  Resp: 16 16  15   Temp: 98.9 F (37.2 C) (!) 100.6 F (38.1 C)  100.3 F (37.9 C)  TempSrc: Oral Oral  Oral  SpO2: 98% 100% 99% 98%  Weight:      Height:        Intake/Output Summary (Last 24 hours) at 11/03/2018 1009 Last data filed at 11/03/2018 0559 Gross per  24 hour  Intake 3126.54 ml  Output 1250 ml  Net 1876.54 ml   Filed Weights   10/31/18 2013  Weight: 95 kg    Examination:  General exam:  Appears calm and comfortable  Respiratory system: Clear to auscultation. Respiratory effort normal.  Intermittent wheezes at bilateral lung bases worse on right Cardiovascular system: Regular rate and rhythm, no murmurs Gastrointestinal system: Soft, nondistended, no rebound or guarding, plus bowel sounds Central nervous system: Alert and oriented.  Grossly intact, moving all extremities Extremities: No lower extremity edema. Skin: Port site is clean dry and intact Psychiatry: Judgement and insight appear normal. Mood & affect appropriate.     Data Reviewed: I have personally reviewed following labs and imaging studies  CBC: Recent Labs  Lab 10/30/18 0740 10/31/18 2028 11/01/18 0904 11/02/18 0321 11/03/18 0307  WBC 3.9* 2.2* 0.8* 0.3* 0.1*  NEUTROABS 3.7 1.9  --   --  0.1*  HGB 9.3* 7.7* 6.6* 7.8* 7.6*  HCT 28.5* 24.2* 20.9* 24.1* 23.3*  MCV 81.7 84.6 85.7 88.0 85.3  PLT 203 119* 86* 48* 19*   Basic Metabolic Panel: Recent Labs  Lab 10/30/18 0740 10/31/18 2028 11/01/18 0904 11/02/18 0321 11/03/18 0307  NA 144 144 144 143 144  K 3.7 3.6 3.8 4.1 4.0  CL 104 106 110 111 113*  CO2 26 25 25 24 23   GLUCOSE 100* 103* 88 85 84  BUN 37* 44* 36* 30* 26*  CREATININE 2.21* 2.17* 1.82* 2.00* 1.93*  CALCIUM 9.3 8.6* 8.3* 8.2* 8.1*  MG 1.8  --   --  1.8  --   PHOS  --   --   --  2.7  --    GFR: Estimated Creatinine Clearance: 50.9 mL/min (A) (by C-G formula based on SCr of 1.93 mg/dL (H)). Liver Function Tests: Recent Labs  Lab 10/30/18 0740 10/31/18 2028 11/03/18 0307  AST 13* 15 16  ALT 9 12 16   ALKPHOS 52 40 40  BILITOT 0.7 1.0 1.0  PROT 6.8 6.5 5.4*  ALBUMIN 3.1* 3.1* 2.3*   No results for input(s): LIPASE, AMYLASE in the last 168 hours. No results for input(s): AMMONIA in the last 168 hours. Coagulation Profile: No results for input(s): INR, PROTIME in the last 168 hours. Cardiac Enzymes: No results for input(s): CKTOTAL, CKMB, CKMBINDEX, TROPONINI in the last  168 hours. BNP (last 3 results) No results for input(s): PROBNP in the last 8760 hours. HbA1C: No results for input(s): HGBA1C in the last 72 hours. CBG: Recent Labs  Lab 11/02/18 0750 11/02/18 1158 11/02/18 1657 11/02/18 2058 11/03/18 0815  GLUCAP 80 106* 77 79 86   Lipid Profile: No results for input(s): CHOL, HDL, LDLCALC, TRIG, CHOLHDL, LDLDIRECT in the last 72 hours. Thyroid Function Tests: No results for input(s): TSH, T4TOTAL, FREET4, T3FREE, THYROIDAB in the last 72 hours. Anemia Panel: No results for input(s): VITAMINB12, FOLATE, FERRITIN, TIBC, IRON, RETICCTPCT in the last 72 hours. Sepsis Labs: Recent Labs  Lab 10/31/18 2146 11/02/18 0910 11/03/18 0307  PROCALCITON  --  0.23 0.58  LATICACIDVEN 0.75  --   --     Recent Results (from the past 240 hour(s))  Blood culture (routine x 2)     Status: None (Preliminary result)   Collection Time: 10/31/18  8:29 PM  Result Value Ref Range Status   Specimen Description   Final    BLOOD RIGHT HAND Performed at [facility], [address], [place], [place] [address]  Special Requests   Final    BOTTLES DRAWN AEROBIC AND ANAEROBIC Blood Culture results may not be optimal due to an excessive volume of blood received in culture bottles Performed at [facility], [address], [place], [place] [address]    Culture   Final    NO GROWTH 2 DAYS Performed at [facility], [address] [place], [place] [address]    Report Status PENDING  Incomplete  Blood culture (routine x 2)     Status: None (Preliminary result)   Collection Time: 10/31/18  8:34 PM  Result Value Ref Range Status   Specimen Description   Final    BLOOD LEFT HAND Performed at [facility], [address], [place], [place] [address]    Special Requests   Final    BOTTLES DRAWN AEROBIC AND ANAEROBIC Blood Culture results may not be optimal due to an excessive volume of blood received in culture bottles Performed  at [facility], [address] [place], [place] [address]    Culture   Final    NO GROWTH 2 DAYS Performed at [facility], [address], [place], [place] [address]    Report Status PENDING  Incomplete  MRSA PCR Screening     Status: None   Collection Time: 11/02/18  5:49 PM  Result Value Ref Range Status   MRSA by PCR NEGATIVE NEGATIVE Final    Comment:        The GeneXpert MRSA Assay (FDA approved for NASAL specimens only), is one component of a comprehensive MRSA colonization surveillance program. It is not intended to diagnose MRSA infection nor to guide or monitor treatment for MRSA infections. Performed at [facility], [address], [place], [place] [address]          Radiology Studies: Dg Chest Port 1 View  Result Date: 11/02/2018 CLINICAL DATA:  52 year old current history of small-cell lung cancer for which the patient is being treated with chemotherapy and radiation therapy, presenting with acute onset of fever. EXAM: PORTABLE CHEST 1 VIEW COMPARISON:  11/02/2018 and earlier, including CT chest 10/20/2018 and PET-CT 08/21/2018. FINDINGS: Cardiac silhouette normal in size for AP technique. Thoracic aorta mildly tortuous and atherosclerotic, unchanged. RIGHT paratracheal mediastinal lymphadenopathy identified on prior examinations has resolved. LEFT jugular Port-A-Cath tip projects over the mid SVC, unchanged. Patchy airspace opacities at the RIGHT lung base. Lungs otherwise clear. Pulmonary vascularity normal. IMPRESSION: Acute pneumonia involving the RIGHT lung base. Electronically Signed   By: Hulan Saas M.D.   On: 11/02/2018 08:09        Scheduled Meds: . sodium chloride   Intravenous Once  . aspirin  81 mg Oral Daily  . dronabinol  5 mg Oral BID AC  . feeding supplement  1 Container Oral Q24H  . feeding supplement (ENSURE ENLIVE)  237 mL Oral BID BM  . FLUoxetine  40 mg Oral Daily  . insulin aspart   0-9 Units Subcutaneous TID WC  . isosorbide mononitrate  120 mg Oral Daily  . mirtazapine  30 mg Oral QHS  . mometasone-formoterol  2 puff Inhalation BID  . multivitamin with minerals  1 tablet Oral Daily  . nebivolol  20 mg Oral Daily  . nystatin  5 mL Oral QID  . pantoprazole  40 mg Oral Daily  . prazosin  1 mg Oral QHS  . ranolazine  1,000 mg Oral BID  . rosuvastatin  20 mg  Oral Daily  . sodium chloride flush  10-40 mL Intracatheter Q12H  . tiotropium  18 mcg Inhalation Daily   Continuous Infusions: . ceFEPime (MAXIPIME) IV 2 g (11/03/18 0819)     LOS: 2 days    Time spent: 35    Delaine Lame, MD [facility]  If 7PM-7AM, please contact night-coverage [email or web address] Password [email or web address] 11/03/2018, 10:09 AM

## 2018-11-03 NOTE — Progress Notes (Signed)
Nutrition Follow-up  DOCUMENTATION CODES:   (Will continue to assess for malnutrition)  INTERVENTION:  - TPN initiation and advancement per Pharmacist. - Continue Ensure Enlive BID and Boost Breeze once/day.  - Continue to encourage PO intakes. - Weigh patient prior to TPN initiation.   Monitor magnesium, potassium, and phosphorus daily for at least 3 days, MD to replete as needed, as pt is at risk for refeeding syndrome given very poor PO intakes for at least the past 2 weeks.    NUTRITION DIAGNOSIS:   Increased nutrient needs related to chronic illness, catabolic illness, cancer and cancer related treatments as evidenced by estimated needs. -ongoing  GOAL:   Patient will meet greater than or equal to 90% of their needs -unmet  MONITOR:   PO intake, Supplement acceptance, Weight trends, Labs, Other (Comment)(TPN regimen)  REASON FOR ASSESSMENT:   Consult New TPN/TNA  ASSESSMENT:   52 year old with past medical history relevant for small cell lung cancer on chemotherapy and radiation (radiation 1 week ago, chemotherapy 3 weeks ago), COPD, BPH, stage III CKD, grade 1 diastolic dysfunction by echo on 02/2018, CAD s/p stents. He presented to the ED with dysphasia thought to be 2/2 a combination of radiation esophagitis and/or possible extrinsic compression of esophagus from adenopathy; also dx with AKI.  No new weight since admission. Patient on Soft diet and refused breakfast this AM. He did eat 50% of lunch 12/12 which is the only meal he has accepted since RD visit 12/11 afternoon. He has accepted 1 of 2 cartons of Boost Breeze and 1 of 4 bottles of Ensure Enlive.   Talked with Dr. Herbert Moors. GI states that a small bore NGT will be unable to be placed d/t pancytopenia. Patient does have a port in L chest which was placed on 11/8 and through which TPN can be run. Plan to start TPN 12/14 d/t compounding cut off time having already passed.    Medications reviewed; 5 mg Marinol  BID, sliding scale Novolog, daily multivitamin with minerals, 5 mL Mycostatin QID. Labs reviewed; CBGs: 86 and 79 mg/dL today, Cl: 113 mmol/L, BUN: 26 mg/dL, creatinine: 1.93 mg/dL, Ca: 8.1 mg/dL, GFR: 45 mL/min, triglycerides not checked this admission.  IVF; D10 @ 40 mL/hr (326 kcal) until TPN initiation.     NUTRITION - FOCUSED PHYSICAL EXAM:  Will continue to attempt.  Diet Order:   Diet Order            DIET SOFT Room service appropriate? Yes; Fluid consistency: Thin  Diet effective now              EDUCATION NEEDS:   Not appropriate for education at this time  Skin:  Skin Assessment: Reviewed RN Assessment  Last BM:  12/10  Height:   Ht Readings from Last 1 Encounters:  10/31/18 5\' 9"  (1.753 m)    Weight:   Wt Readings from Last 1 Encounters:  10/31/18 95 kg    Ideal Body Weight:  72.73 kg  BMI:  Body mass index is 30.93 kg/m.  Estimated Nutritional Needs:   Kcal:  1194-1740 kcal  Protein:  125-135 grams  Fluid:  >/= 2.2 L/day     Andrew Matin, MS, RD, LDN, [facility] Inpatient Clinical Dietitian Pager # [phone] After hours/weekend pager # [phone]

## 2018-11-03 NOTE — Progress Notes (Signed)
Pharmacy Note   Pharmacy is consulted to start TPN today. As the TPNs are centrally made now at [facility] hospital, TPN will start tomorrow.   In the mean time will start D10 until TPN can be started.  Confirmed that pt has central access through a port.   Will order relevant labs for AM.  Royetta Asal, PharmD, BCPS Pager [phone] 11/03/2018 1:50 PM

## 2018-11-03 NOTE — Plan of Care (Signed)
Plan of care reviewed and discussed with the patient. 

## 2018-11-03 NOTE — Care Management Important Message (Signed)
Important Message  Patient Details  Name: Andrew West MRN: [id number] Date of Birth: [birth year]   Medicare Important Message Given:  Yes    [name] 11/03/2018, 12:56 [place] Message  Patient Details  Name: Andrew West MRN: [id number] Date of Birth: 01-28-1966   Medicare Important Message Given:  Yes    [name] 11/03/2018, 12:56 PM

## 2018-11-04 LAB — DIFFERENTIAL
Abs Immature Granulocytes: 0 10*3/uL (ref 0.00–0.07)
Basophils Absolute: 0 10*3/uL (ref 0.0–0.1)
Basophils Relative: 0 %
Eosinophils Absolute: 0 10*3/uL (ref 0.0–0.5)
Eosinophils Relative: 0 %
Immature Granulocytes: 0 %
Lymphocytes Relative: 50 %
Lymphs Abs: 0.1 10*3/uL — ABNORMAL LOW (ref 0.7–4.0)
Monocytes Absolute: 0 10*3/uL — ABNORMAL LOW (ref 0.1–1.0)
Monocytes Relative: 17 %
Neutro Abs: 0 10*3/uL — ABNORMAL LOW (ref 1.7–7.7)
Neutrophils Relative %: 33 %

## 2018-11-04 LAB — COMPREHENSIVE METABOLIC PANEL WITH GFR
GFR calc Af Amer: 46 mL/min — ABNORMAL LOW (ref >60)
Potassium: 3.7 mmol/L (ref 3.5–5.1)

## 2018-11-04 LAB — COMPREHENSIVE METABOLIC PANEL
ALT: 16 U/L (ref 0–44)
AST: 15 U/L (ref 15–41)
Albumin: 2.2 g/dL — ABNORMAL LOW (ref 3.5–5.0)
Alkaline Phosphatase: 43 U/L (ref 38–126)
Anion gap: 6 (ref 5–15)
BUN: 21 mg/dL — ABNORMAL HIGH (ref 6–20)
CO2: 24 mmol/L (ref 22–32)
Calcium: 8.2 mg/dL — ABNORMAL LOW (ref 8.9–10.3)
Chloride: 113 mmol/L — ABNORMAL HIGH (ref 98–111)
Creatinine, Ser: 1.89 mg/dL — ABNORMAL HIGH (ref 0.61–1.24)
GFR calc non Af Amer: 40 mL/min — ABNORMAL LOW (ref >60)
Glucose, Bld: 123 mg/dL — ABNORMAL HIGH (ref 70–99)
Sodium: 143 mmol/L (ref 135–145)
Total Bilirubin: 1.3 mg/dL — ABNORMAL HIGH (ref 0.3–1.2)
Total Protein: 5.5 g/dL — ABNORMAL LOW (ref 6.5–8.1)

## 2018-11-04 LAB — CBC
HCT: 22.5 % — ABNORMAL LOW (ref 39.0–52.0)
Hemoglobin: 7.3 g/dL — ABNORMAL LOW (ref 13.0–17.0)
MCH: 28.3 pg (ref 26.0–34.0)
MCHC: 32.4 g/dL (ref 30.0–36.0)
MCV: 87.2 fL (ref 80.0–100.0)
Platelets: 8 10*3/uL — CL (ref 150–400)
RBC: 2.58 MIL/uL — ABNORMAL LOW (ref 4.22–5.81)
RDW: 14.4 % (ref 11.5–15.5)
WBC: 0.1 10*3/uL — CL (ref 4.0–10.5)
nRBC: 0 % (ref 0.0–0.2)

## 2018-11-04 LAB — GLUCOSE, CAPILLARY
Glucose-Capillary: 100 mg/dL — ABNORMAL HIGH (ref 70–99)
Glucose-Capillary: 101 mg/dL — ABNORMAL HIGH (ref 70–99)
Glucose-Capillary: 103 mg/dL — ABNORMAL HIGH (ref 70–99)
Glucose-Capillary: 108 mg/dL — ABNORMAL HIGH (ref 70–99)
Glucose-Capillary: 113 mg/dL — ABNORMAL HIGH (ref 70–99)
Glucose-Capillary: 89 mg/dL (ref 70–99)

## 2018-11-04 LAB — TRIGLYCERIDES: Triglycerides: 107 mg/dL (ref <150)

## 2018-11-04 LAB — PREPARE RBC (CROSSMATCH)

## 2018-11-04 LAB — PHOSPHORUS: Phosphorus: 2.8 mg/dL (ref 2.5–4.6)

## 2018-11-04 LAB — PROCALCITONIN: Procalcitonin: 0.72 ng/mL

## 2018-11-04 LAB — PREALBUMIN: Prealbumin: 9.2 mg/dL — ABNORMAL LOW (ref 18–38)

## 2018-11-04 LAB — MAGNESIUM: Magnesium: 1.5 mg/dL — ABNORMAL LOW (ref 1.7–2.4)

## 2018-11-04 MED ORDER — SODIUM CHLORIDE 0.9% IV SOLUTION
Freq: Once | INTRAVENOUS | Status: AC
  Administered 2018-11-04: 14:00:00 via INTRAVENOUS

## 2018-11-04 MED ORDER — VANCOMYCIN HCL IN DEXTROSE 1-5 GM/200ML-% IV SOLN
1000.0000 mg | INTRAVENOUS | Status: DC
  Administered 2018-11-05 – 2018-11-07 (×3): 1000 mg via INTRAVENOUS
  Filled 2018-11-04 (×3): qty 200

## 2018-11-04 MED ORDER — MAGNESIUM SULFATE 2 GM/50ML IV SOLN
2.0000 g | Freq: Once | INTRAVENOUS | Status: DC
  Filled 2018-11-04: qty 50

## 2018-11-04 MED ORDER — VANCOMYCIN HCL 10 G IV SOLR
2000.0000 mg | Freq: Once | INTRAVENOUS | Status: AC
  Administered 2018-11-04: 2000 mg via INTRAVENOUS
  Filled 2018-11-04: qty 2000

## 2018-11-04 MED ORDER — INSULIN ASPART 100 UNIT/ML ~~LOC~~ SOLN
0.0000 [IU] | Freq: Four times a day (QID) | SUBCUTANEOUS | Status: DC
  Administered 2018-11-05 – 2018-11-08 (×7): 1 [IU] via SUBCUTANEOUS

## 2018-11-04 MED ORDER — POTASSIUM CHLORIDE 10 MEQ/100ML IV SOLN
10.0000 meq | INTRAVENOUS | Status: AC
  Administered 2018-11-04 (×2): 10 meq via INTRAVENOUS
  Filled 2018-11-04: qty 100

## 2018-11-04 MED ORDER — POTASSIUM CHLORIDE 10 MEQ/100ML IV SOLN
10.0000 meq | INTRAVENOUS | Status: AC
  Filled 2018-11-04: qty 100

## 2018-11-04 MED ORDER — TRAVASOL 10 % IV SOLN
INTRAVENOUS | Status: AC
  Administered 2018-11-04: 18:00:00 via INTRAVENOUS
  Filled 2018-11-04: qty 518.4

## 2018-11-04 MED ORDER — AMLODIPINE BESYLATE 10 MG PO TABS
10.0000 mg | ORAL_TABLET | Freq: Every day | ORAL | Status: DC
  Administered 2018-11-04 – 2018-11-07 (×4): 10 mg via ORAL
  Filled 2018-11-04 (×5): qty 1

## 2018-11-04 MED ORDER — MAGNESIUM SULFATE IN D5W 1-5 GM/100ML-% IV SOLN
1.0000 g | Freq: Once | INTRAVENOUS | Status: AC
  Administered 2018-11-04: 1 g via INTRAVENOUS
  Filled 2018-11-04 (×2): qty 100

## 2018-11-04 NOTE — Progress Notes (Signed)
[facility] NOTE   Pharmacy Consult for TPN Indication: malnutrition, poor oral intake  Patient Measurements: Height: 5\' 9"  (175.3 cm) Weight: 209 lb 7 oz (95 kg) IBW/kg (Calculated) : 70.7 TPN AdjBW (KG): 76.8 Body mass index is 30.93 kg/m. Usual Weight:   Insulin Requirements: on sensitive SSI q4h (not used any since D10 started on 12/13) - no hx DM  Current Nutrition:  - Boost 1 container q24h - ensure 237 mL bid - marinol for appetite   IVF: D10 at 40 ml/hr  Central access: port-a-cath TPN start date: 11/04/18  ASSESSMENT                                                                                                          HPI: Patient is a 52 y.o M with SCLC on chemotherapy and XRT PTA, presented to the ED on 10/31/18 with c/o hemoptysis, generalized body aches, n/v and abd pain.  He reported having difficulty swallowing solid foods. Abd CT on 12/10 was negative for bowel obstruction or active inflammation. 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  NGT is not able to be placed and unable to perform EGD at this time d/t pancytopenia. To start TPN for malnutrition and poor oral intake.  Significant events:    Today:   Glucose (goal <150): 96-123  Electrolytes: CorrCa and phos wnl; K 3.7; CL slightly elevated at 113, mag low at 1.5  Renal: 1.89 (crcl~52)  LFTs: AST/ALT wnl; Tbili slightly elevated at 1.3  TGs: 107 (12/14)  Prealbumin: 9.2 (12/14)  NUTRITIONAL GOALS                                                                                             RD recs (12/13): Kcal:2185-2470 kcal Protein:125-135 grams Fluid:>/= 2.2 L/day  Custom TPN at goal rate of 100 ml/hr provides: - 130 g/day protein ( 54  g/L) - 72 g/day Lipid  ( 30 g/L) - 336  g/day Dextrose ( 14 %) - 2381 Kcal/day  PLAN                                                                                                                           ++  Dietitan indicated that patient is ay high risk for refeeding syndrome and recom. To monitor magnesium, potassium and phosphorus closely for at least three days.++  Now:  - potassium chloride 10 meq IV x2 runs - magnesium sulfate 1gm IV x1  At 1800 today:  Start TPN at  40 ml/hr. -  Provides 52g of protein, 134 g of dextrose, and 29 g of lipids which provides952kCals per day,meeting 43 % of goal kcal and 23% of goal AA  Electrolytes in TPN: Standard, Cl:Ac ratio 1:2  Plan to advance as tolerated to the goal rate.  TPN to contain standard multivitamins and trace elements.  D/c D10 at 40 mlhr  change sensitive SSI q6h  TPN lab panels on Mondays & Thursdays.  F/u daily.  CMET, Magnesium, phosphorus on 12/15   Andrew West 11/04/2018,7:58 AM

## 2018-11-04 NOTE — Progress Notes (Signed)
Pt presenting with 100.78F fever prior to platelet and RBC transfusion. RN paged MD and made aware. OK to transfuse pt platelets and RBCs per MD. RN will monitor.

## 2018-11-04 NOTE — Plan of Care (Signed)
  Problem: Health Behavior/Discharge Planning: Goal: Ability to manage health-related needs will improve Outcome: Progressing   Problem: Clinical Measurements: Goal: Ability to maintain clinical measurements within normal limits will improve Outcome: Progressing Goal: Will remain free from infection Outcome: Progressing Goal: Diagnostic test results will improve Outcome: Progressing Goal: Respiratory complications will improve Outcome: Progressing   Problem: Activity: Goal: Risk for activity intolerance will decrease Outcome: Progressing   Problem: Nutrition: Goal: Adequate nutrition will be maintained Outcome: Progressing   Problem: Pain Managment: Goal: General experience of comfort will improve Outcome: Progressing   Problem: Safety: Goal: Ability to remain free from injury will improve Outcome: Progressing

## 2018-11-04 NOTE — Plan of Care (Signed)
Care plan reviewed.

## 2018-11-04 NOTE — Progress Notes (Signed)
Pt. Sleeping and wife requested that patient be allowed to sleep.

## 2018-11-04 NOTE — Progress Notes (Signed)
Noted that patient still with neutropenic fever and severe pancytopenia.  TPN started.  Must continue supportive/symptomatic care.  We will check back Monday - call sooner if needed.

## 2018-11-04 NOTE — Progress Notes (Signed)
PROGRESS NOTE    Andrew West  GEX:[id number] DOB: 05/02/1966 DOA: 10/31/2018 PCP: [name], MD   Brief Narrative:  52 year old with past medical history relevant for small cell lung cancer on chemotherapy and radiation (radiation 1 week ago, chemotherapy 3 weeks ago), COPD, BPH, stage III CKD, grade 1 diastolic dysfunction by echo on 02/26/2018, coronary artery disease status post stents (multiple vessel disease by cardiac catheterization on 12/16/2016, felt to be poor candidate for further intervention except for possibly CABG), who presents with dysphasia thought to be secondary to a combination of radiation esophagitis and/or possible extrinsic compression of esophagus from adenopathy and AKI.  Assessment & Plan:   Active Problems:   Diabetes mellitus (HCC)   CAD S/P multiple PCI's   AKI (acute kidney injury) (HCC)  #) Pneumonia/neutropenic fever: Patient continues to have high fevers.  Chest x-ray showed evidence of pneumonia. -Continue IV cefepime 11/02/2018 -Add IV vancomycin 11/04/2018 -Blood cultures  10/31/2018 no growth to date -Urine culture  11/02/2018 no growth to date  #) Dysphagia/nausea/vomiting: Patient has not improved much.  He continues to have dysphagia.  Because of his severe nutritional depletion he was started on TPN.  He is currently not a candidate for an EGD or placement of an NG tube due to his profound neutropenia and thrombocytopenia. -Regular diet by mouth as tolerated -[facility] following, pending EGD once neutropenia/thrombocytopenia are improving -TPN started 11/04/2018, pharmacy consult  #) Acute on chronic stage III CKD: Improving -Avoid nephrotoxins  #) Pancytopenia: Currently patient is thrombocytopenic and likely neutropenic.   -1 unit packed red blood cells and platelets transfused today -Hold clopidogrel and subcutaneous prophylaxis while platelets are below 50  #) Hypertension/hyperlipidemia: -Continue amlodipine 10  mg daily - Continue isosorbide mononitrate 120 mg daily -Continue nebivolol 20 mg daily - Continue rosuvastatin 20 mg daily-continue hydralazine 25 mg twice daily  #) Coronary artery disease status post stent: Patient apparently is no longer a candidate for any nonsurgical interventions. -Continue aspirin 81 mg daily -Hold clopidogrel 75 mg daily -Continue beta-blocker, long-acting nitrate - Continue statin -Continue ranolazine thousand milligrams twice daily  #) COPD: -Continue L AMA/ICS/LABA -Continue PRN short-acting bronchodilators  #) Small cell lung cancer: Patient apparently is completed 2 cycles of cisplatin and and etoposide as well as radiotherapy. - We will consult oncology, Dr. Shirline Frees  #) BPH: -Continue prazosin  #) Pain/psych: -Continue dronabinol -Continue fluoxetine 40 mg daily  Fluids: TPN Electrolytes: TPN Nutrition: TPN plus regular diet  Prophylaxis: Thrombocytopenic  Disposition: Pending resolution of pneumonia and recovery of counts for EGD   Full code    Consultants:   [facility]  Procedures:c)  None  Antimicrobials:   IV cefepime started 11/02/2018   Subjective: Patient reports that he is not much improved.  He reports that the topical numbing agents did not help him swallow any.  He denies any nausea, vomiting, diarrhea.  He continues to report throat pain.  He has not had any fevers.   Objective: Vitals:   11/03/18 2131 11/04/18 0304 11/04/18 0456 11/04/18 0923  BP: 118/73 92/67 123/82   Pulse: 91 74 81   Resp: 16 16 16    Temp: (!) 101.8 F (38.8 C) 98.2 F (36.8 C) 99.8 F (37.7 C)   TempSrc: Oral Oral Oral   SpO2: 93% 97% 96% 90%  Weight:      Height:        Intake/Output Summary (Last 24 hours) at 11/04/2018 1006 Last data filed at 11/04/2018 0500 Gross  per 24 hour  Intake 1045.76 ml  Output 1550 ml  Net -504.24 ml   Filed Weights   10/31/18 2013  Weight: 95 kg    Examination:  General  exam: Appears calm and comfortable  Respiratory system: Clear to auscultation. Respiratory effort normal.  Intermittent wheezes at bilateral lung bases worse on right Cardiovascular system: Regular rate and rhythm, no murmurs Gastrointestinal system: Soft, nondistended, no rebound or guarding, plus bowel sounds Central nervous system: Alert and oriented.  Grossly intact, moving all extremities Extremities: No lower extremity edema. Skin: Port site is clean dry and intact Psychiatry: Judgement and insight appear normal. Mood & affect appropriate.     Data Reviewed: I have personally reviewed following labs and imaging studies  CBC: Recent Labs  Lab 10/30/18 0740 10/31/18 2028 11/01/18 0904 11/02/18 0321 11/03/18 0307 11/04/18 0455  WBC 3.9* 2.2* 0.8* 0.3* 0.1* 0.1*  NEUTROABS 3.7 1.9  --   --  0.1* 0.0*  HGB 9.3* 7.7* 6.6* 7.8* 7.6* 7.3*  HCT 28.5* 24.2* 20.9* 24.1* 23.3* 22.5*  MCV 81.7 84.6 85.7 88.0 85.3 87.2  PLT 203 119* 86* 48* 19* 8*   Basic Metabolic Panel: Recent Labs  Lab 10/30/18 0740 10/31/18 2028 11/01/18 0904 11/02/18 0321 11/03/18 0307 11/04/18 0455  NA 144 144 144 143 144 143  K 3.7 3.6 3.8 4.1 4.0 3.7  CL 104 106 110 111 113* 113*  CO2 26 25 25 24 23 24   GLUCOSE 100* 103* 88 85 84 123*  BUN 37* 44* 36* 30* 26* 21*  CREATININE 2.21* 2.17* 1.82* 2.00* 1.93* 1.89*  CALCIUM 9.3 8.6* 8.3* 8.2* 8.1* 8.2*  MG 1.8  --   --  1.8  --  1.5*  PHOS  --   --   --  2.7  --  2.8   GFR: Estimated Creatinine Clearance: 52 mL/min (A) (by C-G formula based on SCr of 1.89 mg/dL (H)). Liver Function Tests: Recent Labs  Lab 10/30/18 0740 10/31/18 2028 11/03/18 0307 11/04/18 0455  AST 13* 15 16 15   ALT 9 12 16 16   ALKPHOS 52 40 40 43  BILITOT 0.7 1.0 1.0 1.3*  PROT 6.8 6.5 5.4* 5.5*  ALBUMIN 3.1* 3.1* 2.3* 2.2*   No results for input(s): LIPASE, AMYLASE in the last 168 hours. No results for input(s): AMMONIA in the last 168 hours. Coagulation Profile: No  results for input(s): INR, PROTIME in the last 168 hours. Cardiac Enzymes: No results for input(s): CKTOTAL, CKMB, CKMBINDEX, TROPONINI in the last 168 hours. BNP (last 3 results) No results for input(s): PROBNP in the last 8760 hours. HbA1C: No results for input(s): HGBA1C in the last 72 hours. CBG: Recent Labs  Lab 11/03/18 2133 11/04/18 0036 11/04/18 0304 11/04/18 0454 11/04/18 0814  GLUCAP 96 101* 103* 100* 108*   Lipid Profile: Recent Labs    11/04/18 0455  TRIG 107   Thyroid Function Tests: No results for input(s): TSH, T4TOTAL, FREET4, T3FREE, THYROIDAB in the last 72 hours. Anemia Panel: No results for input(s): VITAMINB12, FOLATE, FERRITIN, TIBC, IRON, RETICCTPCT in the last 72 hours. Sepsis Labs: Recent Labs  Lab 10/31/18 2146 11/02/18 0910 11/03/18 0307 11/04/18 0455  PROCALCITON  --  0.23 0.58 0.72  LATICACIDVEN 0.75  --   --   --     Recent Results (from the past 240 hour(s))  Blood culture (routine x 2)     Status: None (Preliminary result)   Collection Time: 10/31/18  8:29 PM  Result Value  Ref Range Status   Specimen Description   Final    BLOOD RIGHT HAND Performed at [facility], [address], [place], [place] [address]    Special Requests   Final    BOTTLES DRAWN AEROBIC AND ANAEROBIC Blood Culture results may not be optimal due to an excessive volume of blood received in culture bottles Performed at [facility], [address] [place], [place] [address]    Culture   Final    NO GROWTH 2 DAYS Performed at [facility], [address] [place], [place] [address]    Report Status PENDING  Incomplete  Blood culture (routine x 2)     Status: None (Preliminary result)   Collection Time: 10/31/18  8:34 PM  Result Value Ref Range Status   Specimen Description   Final    BLOOD LEFT HAND Performed at [facility], [address], [place], [place] [address]    Special Requests   Final    BOTTLES DRAWN  AEROBIC AND ANAEROBIC Blood Culture results may not be optimal due to an excessive volume of blood received in culture bottles Performed at [facility], [address], [place], [place] [address]    Culture   Final    NO GROWTH 2 DAYS Performed at [facility], [address], [place], [place] [address]    Report Status PENDING  Incomplete  Culture, Urine     Status: Abnormal   Collection Time: 11/02/18  4:27 PM  Result Value Ref Range Status   Specimen Description   Final    URINE, RANDOM Performed at [facility], [address], [place], [place] [address]    Special Requests   Final    NONE Performed at [facility], [address] [place], [place] [address]    Culture (A)  Final    <10,000 COLONIES/mL INSIGNIFICANT GROWTH Performed at [facility], [address] [place], [place] [address]    Report Status 11/03/2018 FINAL  Final  MRSA PCR Screening     Status: None   Collection Time: 11/02/18  5:49 PM  Result Value Ref Range Status   MRSA by PCR NEGATIVE NEGATIVE Final    Comment:        The GeneXpert MRSA Assay (FDA approved for NASAL specimens only), is one component of a comprehensive MRSA colonization surveillance program. It is not intended to diagnose MRSA infection nor to guide or monitor treatment for MRSA infections. Performed at [facility], [address] [place], [place] [address]          Radiology Studies: No results found.      Scheduled Meds: . sodium chloride   Intravenous Once  . sodium chloride   Intravenous Once  . aspirin  81 mg Oral Daily  . dronabinol  5 mg Oral BID AC  . feeding supplement  1 Container Oral Q24H  . feeding supplement (ENSURE ENLIVE)  237 mL Oral BID BM  . FLUoxetine  40 mg Oral Daily  . insulin aspart  0-9 Units Subcutaneous Q6H  . isosorbide mononitrate  120 mg Oral Daily  . mirtazapine  30 mg Oral QHS  .  mometasone-formoterol  2 puff Inhalation BID  . multivitamin with minerals  1 tablet Oral Daily  . nebivolol  20 mg Oral Daily  . nystatin  5 mL Oral QID  . pantoprazole  40 mg Oral  Daily  . prazosin  1 mg Oral QHS  . ranolazine  1,000 mg Oral BID  . rosuvastatin  20 mg Oral Daily  . sodium chloride flush  10-40 mL Intracatheter Q12H  . tiotropium  18 mcg Inhalation Daily   Continuous Infusions: . ceFEPime (MAXIPIME) IV 2 g (11/04/18 0809)  . dextrose 40 mL/hr at 11/04/18 0200  . magnesium sulfate 1 - 4 g bolus IVPB    . potassium chloride    . TPN ADULT (ION)    . vancomycin 2,000 mg (11/04/18 0903)  . [START ON 11/05/2018] vancomycin       LOS: 3 days    Time spent: 35    Delaine Lame, MD [facility]  If 7PM-7AM, please contact night-coverage [email or web address] Password [email or web address] 11/04/2018, 10:06 AM

## 2018-11-04 NOTE — Progress Notes (Signed)
Pharmacy Antibiotic Note  Andrew West is a 52 y.o. male admitted on 10/31/2018 with vomiting and abdominal pain. Pharmacy has been consulted for vancomycin dosing.  PMH significant for SCLC on chemotherapy and radiation, COPD, CKD. Currently neutropenia and on cefepime for PNA and started on TPN for dysphagia and malnutrition.   Today, 11/04/18  WBC 0.1, ANC 0  PCT 0.72  SCr 1.89, CrCl ~52 mL/min  Day #3 of cefepime, being started on vancomycin  Plan:  Continue cefepime 2 g IV q12h  Vancomycin 2000 mg loading dose followed by vancomycin 1000 mg IV q24h for goal AUC 400-500  Follow renal function, culture data  Check vancomycin AUC levels at steady state if indicated  Height: 5\' 9"  (175.3 cm) Weight: 209 lb 7 oz (95 kg) IBW/kg (Calculated) : 70.7  Temp (24hrs), Avg:99.8 F (37.7 C), Min:98.2 F (36.8 C), Max:101.8 F (38.8 C)  Recent Labs  Lab 10/31/18 2028 10/31/18 2146 11/01/18 0904 11/02/18 0321 11/03/18 0307 11/04/18 0455  WBC 2.2*  --  0.8* 0.3* 0.1* 0.1*  CREATININE 2.17*  --  1.82* 2.00* 1.93* 1.89*  LATICACIDVEN  --  0.75  --   --   --   --     Estimated Creatinine Clearance: 52 mL/min (A) (by C-G formula based on SCr of 1.89 mg/dL (H)).    Allergies  Allergen Reactions  . Iohexol Anaphylaxis    PT. TO BE PREMEDICATED PRIOR TO IV CONTRAST PER DR Kris Hartmann /MMS//12/15/15Desc: PT BECAME SOB AND CHEST TIGHTNESS AFTER CONTRAST INJECTION.  [name],RT-RCT., [place] Date: [id number]     Antimicrobials this admission: vancomycin 12/14 >>  cefepime 12/12 >>   Dose adjustments this admission:  Microbiology results: 12/10 BCx: no growth 2 days 12/12 UCx: insignificant growth  12/12 MRSA PCR: Negative  Thank you for allowing pharmacy to be a part of this patient's care.  Lenis Noon, PharmD Clinical Pharmacist 11/04/2018 9:45 AM

## 2018-11-05 LAB — URINALYSIS, ROUTINE W REFLEX MICROSCOPIC
Bilirubin Urine: NEGATIVE
Glucose, UA: NEGATIVE mg/dL
Hgb urine dipstick: NEGATIVE
Ketones, ur: NEGATIVE mg/dL
Leukocytes, UA: NEGATIVE
Nitrite: NEGATIVE
PH: 5 (ref 5.0–8.0)
Protein, ur: 30 mg/dL — AB
Specific Gravity, Urine: 1.014 (ref 1.005–1.030)

## 2018-11-05 LAB — GLUCOSE, CAPILLARY
Glucose-Capillary: 115 mg/dL — ABNORMAL HIGH (ref 70–99)
Glucose-Capillary: 134 mg/dL — ABNORMAL HIGH (ref 70–99)

## 2018-11-05 LAB — COMPREHENSIVE METABOLIC PANEL
ALT: 19 U/L (ref 0–44)
AST: 20 U/L (ref 15–41)
Albumin: 2.2 g/dL — ABNORMAL LOW (ref 3.5–5.0)
Anion gap: 10 (ref 5–15)
BUN: 20 mg/dL (ref 6–20)
CO2: 22 mmol/L (ref 22–32)
Calcium: 8.2 mg/dL — ABNORMAL LOW (ref 8.9–10.3)
Chloride: 112 mmol/L — ABNORMAL HIGH (ref 98–111)
Creatinine, Ser: 1.84 mg/dL — ABNORMAL HIGH (ref 0.61–1.24)
GFR calc non Af Amer: 41 mL/min — ABNORMAL LOW (ref >60)
Glucose, Bld: 124 mg/dL — ABNORMAL HIGH (ref 70–99)
Potassium: 3.3 mmol/L — ABNORMAL LOW (ref 3.5–5.1)
Sodium: 144 mmol/L (ref 135–145)
Total Bilirubin: 0.9 mg/dL (ref 0.3–1.2)
Total Protein: 5.8 g/dL — ABNORMAL LOW (ref 6.5–8.1)

## 2018-11-05 LAB — COMPREHENSIVE METABOLIC PANEL WITH GFR
Alkaline Phosphatase: 57 U/L (ref 38–126)
GFR calc Af Amer: 48 mL/min — ABNORMAL LOW (ref >60)

## 2018-11-05 LAB — PHOSPHORUS: Phosphorus: 2.3 mg/dL — ABNORMAL LOW (ref 2.5–4.6)

## 2018-11-05 LAB — MAGNESIUM: Magnesium: 1.6 mg/dL — ABNORMAL LOW (ref 1.7–2.4)

## 2018-11-05 MED ORDER — POTASSIUM PHOSPHATES 15 MMOLE/5ML IV SOLN
10.0000 mmol | Freq: Once | INTRAVENOUS | Status: AC
  Administered 2018-11-05: 10 mmol via INTRAVENOUS
  Filled 2018-11-05: qty 3.33

## 2018-11-05 MED ORDER — MAGNESIUM SULFATE 4 GM/100ML IV SOLN
4.0000 g | Freq: Once | INTRAVENOUS | Status: AC
  Administered 2018-11-05: 4 g via INTRAVENOUS
  Filled 2018-11-05 (×2): qty 100

## 2018-11-05 MED ORDER — TRAVASOL 10 % IV SOLN
INTRAVENOUS | Status: AC
  Administered 2018-11-05: 18:00:00 via INTRAVENOUS
  Filled 2018-11-05: qty 907.2

## 2018-11-05 MED ORDER — POTASSIUM CHLORIDE 10 MEQ/100ML IV SOLN
10.0000 meq | INTRAVENOUS | Status: AC
  Administered 2018-11-05 (×3): 10 meq via INTRAVENOUS
  Filled 2018-11-05 (×3): qty 100

## 2018-11-05 NOTE — Progress Notes (Signed)
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  Assessment & Plan:   Active Problems:   Diabetes mellitus (HCC)   CAD S/P multiple PCI's   AKI (acute kidney injury) (HCC)  #) Pneumonia/neutropenic fever: Patient continues to have high fevers.  Chest x-ray showed evidence of pneumonia. -Continue IV cefepime 11/02/2018 -Continue IV vancomycin 11/04/2018 -Blood cultures  10/31/2018 no growth to date -Urine culture  11/02/2018 no growth to date  #) Dysphagia/nausea/vomiting: Patient reports her swallowing is improved somewhat.  Because of his severe nutritional depletion he was started on TPN.  He is currently not a candidate for an EGD or placement of an NG tube due to his profound neutropenia and thrombocytopenia. -Regular diet by mouth as tolerated -[facility] following, pending EGD once neutropenia/thrombocytopenia are improving -TPN started 11/04/2018, pharmacy consult  #) Acute on chronic stage III CKD: Resolved -Avoid nephrotoxins  #) Pancytopenia: Currently patient is thrombocytopenic and likely neutropenic secondary to chemotherapy. -Hold clopidogrel and subcutaneous prophylaxis while platelets are below 50  #) Hypertension/hyperlipidemia: -Continue amlodipine 10 mg daily - Continue isosorbide mononitrate  120 mg daily -Continue nebivolol 20 mg daily - Continue rosuvastatin 20 mg daily-continue hydralazine 25 mg twice daily  #) Coronary artery disease status post stent: Patient apparently is no longer a candidate for any nonsurgical interventions. -Continue aspirin 81 mg daily -Hold clopidogrel 75 mg daily -Continue beta-blocker, long-acting nitrate - Continue statin -Continue ranolazine thousand milligrams twice daily  #) COPD: -Continue L AMA/ICS/LABA -Continue PRN short-acting bronchodilators  #) Small cell lung cancer: Patient apparently is completed 2 cycles of cisplatin and and etoposide as well as radiotherapy. - We will consult oncology, Dr. Shirline Frees  #) BPH: -Continue prazosin  #) Pain/psych: -Continue dronabinol -Continue fluoxetine 40 mg daily  Fluids: TPN Electrolytes: TPN Nutrition: TPN plus regular diet  Prophylaxis: Thrombocytopenic  Disposition: Pending resolution of pneumonia and recovery of counts for EGD   Full code    Consultants:   [facility]  Procedures:c)  None  Antimicrobials:   IV cefepime started 11/02/2018   Subjective: Patient reports that his swallowing is improved.  He reports his odynophagia is improving and he can tolerate liquids.  He continues to have significant amounts of difficulty with solids.  He is tolerating his TPN fairly well.   Objective: Vitals:   11/04/18 2108 11/05/18 0611 11/05/18 0813 11/05/18 1016  BP: 113/72 137/88  132/87  Pulse: 78 82  88  Resp: 18 16    Temp: (!) 100.4 F (38 C) 99.4 F (37.4 C)    TempSrc: Oral Oral    SpO2: 93% 97% 93% 100%  Weight:      Height:        Intake/Output Summary (Last 24 hours) at 11/05/2018 1131 Last data filed at 11/05/2018 1036 Gross per 24 hour  Intake 2749.28 ml  Output 1500 ml  Net 1249.28 ml   [employer]  10/31/18 2013  Weight: 95 kg    Examination:  General exam: Appears calm and comfortable  Respiratory system: Clear to  auscultation. Respiratory effort normal.  Intermittent wheezes at bilateral lung bases worse on right Cardiovascular system: Regular rate and rhythm, no murmurs Gastrointestinal system: Soft, nondistended, no rebound or guarding, plus bowel sounds Central nervous system: Alert and oriented.  Grossly intact, moving all extremities Extremities: No lower extremity edema. Skin: Port site is clean dry and intact Psychiatry: Judgement and insight appear normal. Mood & affect appropriate.     Data Reviewed: I have personally reviewed following labs and imaging studies  CBC: Recent Labs  Lab 10/30/18 0740 10/31/18 2028 11/01/18 0904 11/02/18 0321 11/03/18 0307 11/04/18 0455  WBC 3.9* 2.2* 0.8* 0.3* 0.1* 0.1*  NEUTROABS 3.7 1.9  --   --  0.1* 0.0*  HGB 9.3* 7.7* 6.6* 7.8* 7.6* 7.3*  HCT 28.5* 24.2* 20.9* 24.1* 23.3* 22.5*  MCV 81.7 84.6 85.7 88.0 85.3 87.2  PLT 203 119* 86* 48* 19* 8*   Basic Metabolic Panel: Recent Labs  Lab 10/30/18 0740  11/01/18 0904 11/02/18 0321 11/03/18 0307 11/04/18 0455 11/05/18 0427  NA 144   < > 144 143 144 143 144  K 3.7   < > 3.8 4.1 4.0 3.7 3.3*  CL 104   < > 110 111 113* 113* 112*  CO2 26   < > 25 24 23 24 22   GLUCOSE 100*   < > 88 85 84 123* 124*  BUN 37*   < > 36* 30* 26* 21* 20  CREATININE 2.21*   < > 1.82* 2.00* 1.93* 1.89* 1.84*  CALCIUM 9.3   < > 8.3* 8.2* 8.1* 8.2* 8.2*  MG 1.8  --   --  1.8  --  1.5* 1.6*  PHOS  --   --   --  2.7  --  2.8 2.3*   < > = values in this interval not displayed.   GFR: Estimated Creatinine Clearance: 53.4 mL/min (A) (by C-G formula based on SCr of 1.84 mg/dL (H)). Liver Function Tests: Recent Labs  Lab 10/30/18 0740 10/31/18 2028 11/03/18 0307 11/04/18 0455 11/05/18 0427  AST 13* 15 16 15 20   ALT 9 12 16 16 19   ALKPHOS 52 40 40 43 57  BILITOT 0.7 1.0 1.0 1.3* 0.9  PROT 6.8 6.5 5.4* 5.5* 5.8*  ALBUMIN 3.1* 3.1* 2.3* 2.2* 2.2*   No results for input(s): LIPASE, AMYLASE in the last 168 hours. No  results for input(s): AMMONIA in the last 168 hours. Coagulation Profile: No results for input(s): INR, PROTIME in the last 168 hours. Cardiac Enzymes: No results for input(s): CKTOTAL, CKMB, CKMBINDEX, TROPONINI in the last 168 hours. BNP (last 3 results) No results for input(s): PROBNP in the last 8760 hours. HbA1C: No results for input(s): HGBA1C in the last 72 hours. CBG: Recent Labs  Lab 11/04/18 0454 11/04/18 0814 11/04/18 1638 11/04/18 2351 11/05/18 0608  GLUCAP 100* 108* 89 113* 115*   Lipid Profile: Recent Labs    11/04/18 0455  TRIG 107   Thyroid Function Tests: No results for input(s): TSH, T4TOTAL, FREET4, T3FREE, THYROIDAB in the last 72 hours. Anemia Panel: No results for input(s): VITAMINB12, FOLATE, FERRITIN, TIBC, IRON, RETICCTPCT in the last 72 hours. Sepsis Labs: Recent Labs  Lab 10/31/18 2146 11/02/18 0910 11/03/18 0307 11/04/18 0455  PROCALCITON  --  0.23 0.58 0.72  LATICACIDVEN 0.75  --   --   --  Recent Results (from the past 240 hour(s))  Blood culture (routine x 2)     Status: None (Preliminary result)   Collection Time: 10/31/18  8:29 PM  Result Value Ref Range Status   Specimen Description   Final    BLOOD RIGHT HAND Performed at [facility], [address] [place], [place] [address]    Special Requests   Final    BOTTLES DRAWN AEROBIC AND ANAEROBIC Blood Culture results may not be optimal due to an excessive volume of blood received in culture bottles Performed at [facility], [address] [place], [place] [address]    Culture   Final    NO GROWTH 3 DAYS Performed at [facility], [address], [place], [place] [address]    Report Status PENDING  Incomplete  Blood culture (routine x 2)     Status: None (Preliminary result)   Collection Time: 10/31/18  8:34 PM  Result Value Ref Range Status   Specimen Description   Final    BLOOD LEFT HAND Performed at [facility], [address] [place], [place] [address]    Special Requests   Final    BOTTLES DRAWN AEROBIC AND ANAEROBIC Blood Culture results may not be optimal due to an excessive volume of blood received in culture bottles Performed at [facility], [address] [place], [place] [address]    Culture   Final    NO GROWTH 3 DAYS Performed at [facility], [address], [name], [place] [address]    Report Status PENDING  Incomplete  Culture, Urine     Status: Abnormal   Collection Time: 11/02/18  4:27 PM  Result Value Ref Range Status   Specimen Description   Final    URINE, RANDOM Performed at [facility], [address] [place], [place] [address]    Special Requests   Final    NONE Performed at [facility], [address], [place], [place] [address]    Culture (A)  Final    <10,000 COLONIES/mL INSIGNIFICANT GROWTH Performed at [facility], [address], [place], [place] [address]    Report Status 11/03/2018 FINAL  Final  MRSA PCR Screening     Status: None   Collection Time: 11/02/18  5:49 PM  Result Value Ref Range Status   MRSA by PCR NEGATIVE NEGATIVE Final    Comment:        The GeneXpert MRSA Assay (FDA approved for NASAL specimens only), is one component of a comprehensive MRSA colonization surveillance program. It is not intended to diagnose MRSA infection nor to guide or monitor treatment for MRSA infections. Performed at [facility], [address] [place], [place] [address]          Radiology Studies: No results found.      Scheduled Meds: . sodium chloride   Intravenous Once  . amLODipine  10 mg Oral Daily  . aspirin  81 mg Oral Daily  . dronabinol  5 mg Oral BID AC  . feeding supplement  1 Container Oral Q24H  . feeding supplement (ENSURE ENLIVE)  237 mL Oral BID BM  . FLUoxetine  40 mg Oral Daily  . insulin aspart  0-9 Units Subcutaneous Q6H  . isosorbide  mononitrate  120 mg Oral Daily  . mirtazapine  30 mg Oral QHS  . mometasone-formoterol  2 puff Inhalation BID  .  multivitamin with minerals  1 tablet Oral Daily  . nebivolol  20 mg Oral Daily  . nystatin  5 mL Oral QID  . pantoprazole  40 mg Oral Daily  . prazosin  1 mg Oral QHS  . ranolazine  1,000 mg Oral BID  . rosuvastatin  20 mg Oral Daily  . sodium chloride flush  10-40 mL Intracatheter Q12H  . tiotropium  18 mcg Inhalation Daily   Continuous Infusions: . ceFEPime (MAXIPIME) IV 2 g (11/05/18 0834)  . magnesium sulfate 1 - 4 g bolus IVPB 4 g (11/05/18 1112)  . potassium chloride 10 mEq (11/05/18 1105)  . potassium PHOSPHATE IVPB (in mmol)    . TPN ADULT (ION) 40 mL/hr at 11/05/18 0600  . TPN ADULT (ION)    . vancomycin 1,000 mg (11/05/18 1035)     LOS: 4 days    Time spent: 35    Delaine Lame, MD [facility]  If 7PM-7AM, please contact night-coverage [email or web address] Password [email or web address] 11/05/2018, 11:31 AM

## 2018-11-05 NOTE — Progress Notes (Signed)
[place] NOTE   Pharmacy Consult for TPN Indication: malnutrition, poor oral intake  Patient Measurements: Height: 5\' 9"  (175.3 cm) Weight: 209 lb 7 oz (95 kg) IBW/kg (Calculated) : 70.7 TPN AdjBW (KG): 76.8 Body mass index is 30.93 kg/m. Usual Weight:   Insulin Requirements: on sensitive SSI q4h (not used any since TPN started) - no hx DM  Current Nutrition:  - Boost 1 container q24h - ensure 237 mL bid - marinol for appetite   IVF:   Central access: port-a-cath TPN start date: 11/04/18  ASSESSMENT                                                                                                          HPI: Patient is a 52 y.o M with SCLC on chemotherapy and XRT PTA, presented to the ED on 10/31/18 with c/o hemoptysis, generalized body aches, n/v and abd pain.  He reported having difficulty swallowing solid foods. Abd CT on 12/10 was negative for bowel obstruction or active inflammation. 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  NGT is not able to be placed and unable to perform EGD at this time d/t pancytopenia. To start TPN for malnutrition and poor oral intake.  Significant events:    Today:   Glucose (goal <150): at goal  Electrolytes: CorrCa; K low 3.3, Mag low at 1.6 phos down 2.3; CL slightly elevated but down 112 with CL:AC ratio at 1:2  Renal: 1.84 (crcl~52)  LFTs: AST/ALT wnl; Tbili down wnl  TGs: 107 (12/14)  Prealbumin: 9.2 (12/14)  NUTRITIONAL GOALS                                                                                             RD recs (12/13): Kcal:2185-2470 kcal Protein:125-135 grams Fluid:>/= 2.2 L/day  Custom TPN at goal rate of 100 ml/hr provides: - 130 g/day protein ( 54  g/L) - 72 g/day Lipid  ( 30 g/L) - 336  g/day Dextrose ( 14 %) - 2381 Kcal/day  PLAN                                                                                                                           ++  Dietitan indicated that patient is ay high risk for refeeding syndrome and recom. To monitor magnesium, potassium and phosphorus closely for at least three days.++  Now:  - potassium phosphate 10 mmol IV x1 - potassium chloride 10 meq IV x3 runs - magnesium sulfate 4gm IV x1 per MD  At 1800 today:  Advance TPN to 70 ml/hr. -  Provides 91g of protein, 134 g of dextrose, and 50 g of lipids which provides1666kCals per day,meeting763 % of goal kcal and 73% of goal AA  Electrolytes in TPN: Standard except increase K to 60 meq/L, increase phos to 20 mmol/L; will keep mag at 5 meq/L since pt is getting 4 gm x1 today; continue Cl:Ac ratio 1:2  Plan to advance as tolerated to the goal rate.  TPN to contain standard multivitamins and trace elements.  change sensitive SSI q6h  TPN lab panels on Mondays & Thursdays.  F/u daily.   Shanell Aden P 11/05/2018,8:05 AM

## 2018-11-06 ENCOUNTER — Inpatient Hospital Stay: Payer: Medicare Other

## 2018-11-06 DIAGNOSIS — R5081 Fever presenting with conditions classified elsewhere: Secondary | ICD-10-CM

## 2018-11-06 DIAGNOSIS — T451X5A Adverse effect of antineoplastic and immunosuppressive drugs, initial encounter: Secondary | ICD-10-CM

## 2018-11-06 DIAGNOSIS — C3490 Malignant neoplasm of unspecified part of unspecified bronchus or lung: Secondary | ICD-10-CM

## 2018-11-06 DIAGNOSIS — Z79899 Other long term (current) drug therapy: Secondary | ICD-10-CM

## 2018-11-06 DIAGNOSIS — N179 Acute kidney failure, unspecified: Secondary | ICD-10-CM

## 2018-11-06 DIAGNOSIS — D696 Thrombocytopenia, unspecified: Secondary | ICD-10-CM

## 2018-11-06 DIAGNOSIS — D709 Neutropenia, unspecified: Secondary | ICD-10-CM

## 2018-11-06 DIAGNOSIS — E0800 Diabetes mellitus due to underlying condition with hyperosmolarity without nonketotic hyperglycemic-hyperosmolar coma (NKHHC): Secondary | ICD-10-CM

## 2018-11-06 DIAGNOSIS — N289 Disorder of kidney and ureter, unspecified: Secondary | ICD-10-CM

## 2018-11-06 DIAGNOSIS — J189 Pneumonia, unspecified organism: Principal | ICD-10-CM

## 2018-11-06 DIAGNOSIS — D759 Disease of blood and blood-forming organs, unspecified: Secondary | ICD-10-CM

## 2018-11-06 DIAGNOSIS — D61811 Other drug-induced pancytopenia: Secondary | ICD-10-CM

## 2018-11-06 LAB — CULTURE, BLOOD (ROUTINE X 2)
Culture: NO GROWTH
Culture: NO GROWTH

## 2018-11-06 LAB — BPAM RBC
Blood Product Expiration Date: 202001032359
Blood Product Expiration Date: 202001032359
Blood Product Expiration Date: 202001082359
ISSUE DATE / TIME: 201912111435
ISSUE DATE / TIME: 201912111738
ISSUE DATE / TIME: 201912141332
Unit Type and Rh: 6200
Unit Type and Rh: 6200
Unit Type and Rh: 6200

## 2018-11-06 LAB — BPAM PLATELET PHERESIS
Blood Product Expiration Date: 201912142359
ISSUE DATE / TIME: 201912141147
Unit Type and Rh: 6200

## 2018-11-06 LAB — CBC
HCT: 22.9 % — ABNORMAL LOW (ref 39.0–52.0)
Hemoglobin: 7.6 g/dL — ABNORMAL LOW (ref 13.0–17.0)
MCH: 28.1 pg (ref 26.0–34.0)
MCHC: 33.2 g/dL (ref 30.0–36.0)
MCV: 84.8 fL (ref 80.0–100.0)
Platelets: 15 10*3/uL — CL (ref 150–400)
RBC: 2.7 MIL/uL — ABNORMAL LOW (ref 4.22–5.81)
RDW: 14.3 % (ref 11.5–15.5)
WBC: 0.2 K/uL — CL (ref 4.0–10.5)
nRBC: 0 % (ref 0.0–0.2)

## 2018-11-06 LAB — TYPE AND SCREEN
ABO/RH(D): A POS
Antibody Screen: NEGATIVE
Unit division: 0
Unit division: 0
Unit division: 0

## 2018-11-06 LAB — COMPREHENSIVE METABOLIC PANEL
ALT: 17 U/L (ref 0–44)
AST: 14 U/L — ABNORMAL LOW (ref 15–41)
Albumin: 2 g/dL — ABNORMAL LOW (ref 3.5–5.0)
Alkaline Phosphatase: 53 U/L (ref 38–126)
Anion gap: 6 (ref 5–15)
BUN: 24 mg/dL — ABNORMAL HIGH (ref 6–20)
CO2: 24 mmol/L (ref 22–32)
Calcium: 8.1 mg/dL — ABNORMAL LOW (ref 8.9–10.3)
Chloride: 113 mmol/L — ABNORMAL HIGH (ref 98–111)
Creatinine, Ser: 1.75 mg/dL — ABNORMAL HIGH (ref 0.61–1.24)
GFR calc Af Amer: 51 mL/min — ABNORMAL LOW (ref >60)
GFR calc non Af Amer: 44 mL/min — ABNORMAL LOW (ref >60)
Glucose, Bld: 131 mg/dL — ABNORMAL HIGH (ref 70–99)
Sodium: 143 mmol/L (ref 135–145)
Total Bilirubin: 0.6 mg/dL (ref 0.3–1.2)
Total Protein: 5.8 g/dL — ABNORMAL LOW (ref 6.5–8.1)

## 2018-11-06 LAB — DIFFERENTIAL
Band Neutrophils: 0 %
Basophils Absolute: 0 10*3/uL (ref 0.0–0.1)
Basophils Relative: 0 %
Eosinophils Absolute: 0 K/uL (ref 0.0–0.5)
Eosinophils Relative: 0 %
Lymphocytes Relative: 0 %
Lymphs Abs: 0 10*3/uL — ABNORMAL LOW (ref 0.7–4.0)
Monocytes Absolute: 0 10*3/uL — ABNORMAL LOW (ref 0.1–1.0)
Monocytes Relative: 0 %
Neutrophils Relative %: 0 %
nRBC: 0 /100 WBC

## 2018-11-06 LAB — GLUCOSE, CAPILLARY
Glucose-Capillary: 104 mg/dL — ABNORMAL HIGH (ref 70–99)
Glucose-Capillary: 110 mg/dL — ABNORMAL HIGH (ref 70–99)
Glucose-Capillary: 117 mg/dL — ABNORMAL HIGH (ref 70–99)
Glucose-Capillary: 123 mg/dL — ABNORMAL HIGH (ref 70–99)
Glucose-Capillary: 126 mg/dL — ABNORMAL HIGH (ref 70–99)

## 2018-11-06 LAB — PREPARE PLATELET PHERESIS: Unit division: 0

## 2018-11-06 LAB — PREALBUMIN
Prealbumin: 6.8 mg/dL — ABNORMAL LOW (ref 18–38)
Prealbumin: 6.9 mg/dL — ABNORMAL LOW (ref 18–38)

## 2018-11-06 LAB — TRIGLYCERIDES: Triglycerides: 131 mg/dL (ref <150)

## 2018-11-06 LAB — PHOSPHORUS: Phosphorus: 2.5 mg/dL (ref 2.5–4.6)

## 2018-11-06 LAB — COMPREHENSIVE METABOLIC PANEL WITH GFR: Potassium: 3.4 mmol/L — ABNORMAL LOW (ref 3.5–5.1)

## 2018-11-06 LAB — MAGNESIUM: Magnesium: 1.9 mg/dL (ref 1.7–2.4)

## 2018-11-06 MED ORDER — TBO-FILGRASTIM 480 MCG/0.8ML ~~LOC~~ SOSY
480.0000 ug | PREFILLED_SYRINGE | Freq: Every day | SUBCUTANEOUS | Status: DC
  Administered 2018-11-07: 480 ug via SUBCUTANEOUS
  Filled 2018-11-06 (×2): qty 0.8

## 2018-11-06 MED ORDER — FILGRASTIM 300 MCG/ML IJ SOLN: 300.0000 ug | Freq: Every day | INTRAVENOUS | Status: DC

## 2018-11-06 MED ORDER — SENNA 8.6 MG PO TABS
1.0000 | ORAL_TABLET | Freq: Every day | ORAL | Status: DC
  Administered 2018-11-06: 8.6 mg via ORAL
  Filled 2018-11-06 (×2): qty 1

## 2018-11-06 MED ORDER — TBO-FILGRASTIM 300 MCG/0.5ML ~~LOC~~ SOSY
300.0000 ug | PREFILLED_SYRINGE | Freq: Once | SUBCUTANEOUS | Status: DC
  Filled 2018-11-06: qty 0.5

## 2018-11-06 MED ORDER — SODIUM CHLORIDE 0.9% IV SOLUTION
Freq: Once | INTRAVENOUS | Status: AC
  Administered 2018-11-06: 12:00:00 via INTRAVENOUS

## 2018-11-06 MED ORDER — TRAVASOL 10 % IV SOLN
INTRAVENOUS | Status: AC
  Administered 2018-11-06: 19:00:00 via INTRAVENOUS
  Filled 2018-11-06: qty 1296

## 2018-11-06 NOTE — Progress Notes (Signed)
Radiation Oncology         ([phone]) [phone] ________________________________  Name: Andrew West MRN: [id number]  Date: 10/30/2018  DOB: [birth year]  End of Treatment Note  Diagnosis:  52 y.o. 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 The patient then received a boost treatment for an additional 6 Gy delivered in 3 fractions. This yielded a final total dose of 66 Gy.   Beams/energy:   IMRT / 6X Photon  Narrative: The patient tolerated radiation treatment relatively well with concurrent chemotherapy.   The patient did experience esophagitis during the course of treatment which required management with Carafate. He did lose some weight as a result of having a poor appetite and is supplementing with a few Ensure drinks per day. He was prescribed Marinol to increased his appetite and encouraged to increase his dietary consumption. He noted improvement in his appetite by the end of treatment and has gained back a little weight.   Plan: The patient has completed radiation treatment. The patient will return to radiation oncology clinic for routine followup in one month. I advised the patient to call or return sooner if they have any questions or concerns related to their recovery or treatment. ________________________________  Radene Gunning, MD, PhD  This document serves as a record of services personally performed by Dorothy Puffer, MD. It was created on his behalf by Ivar Bury, a trained medical scribe. The creation of this record is based on the scribe's personal observations and the provider's statements to them. This document has been checked and approved by the attending provider.

## 2018-11-06 NOTE — Progress Notes (Signed)
[place] NOTE   Pharmacy Consult for TPN Indication: malnutrition, poor oral intake  Patient Measurements: Height: 5\' 9"  (175.3 cm) Weight: 209 lb 7 oz (95 kg) IBW/kg (Calculated) : 70.7 TPN AdjBW (KG): 76.8 Body mass index is 30.93 kg/m. Usual Weight:   Insulin Requirements: on sensitive SSI q6h - no hx DM  Current Nutrition: taking intermittently - Boost 1 container q24h - ensure 237 mL bid - marinol for appetite   IVF: none  Central access: port-a-cath TPN start date: 11/04/18  ASSESSMENT                                                                                                          HPI: Patient is a 52 y.o M with SCLC on chemotherapy and XRT PTA, presented to the ED on 10/31/18 with c/o hemoptysis, generalized body aches, n/v and abd pain.  He reported having difficulty swallowing solid foods. Abd CT on 12/10 was negative for bowel obstruction or active inflammation. 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  NGT is not able to be placed and unable to perform EGD at this time d/t pancytopenia. To start TPN for malnutrition and poor oral intake.  Significant events:    Today:   Glucose (goal <150): range 110-134, at goal  Electrolytes: K sl low, Mag & Phos repleted             CL remains slightly elevated at 113 with CL:AC ratio at 1:2  Renal: 1.75, decreasing slowly  LFTs: AST/ALT wnl; Tbili down wnl  TGs: 107 (12/14), 131 (12/16),  Prealbumin: 9.2 (12/14), 6.9 (12/16)  NUTRITIONAL GOALS                                                                                             RD recs (12/13): Kcal:2185-2470 kcal Protein:125-135 grams Fluid:>/= 2.2 L/day  Custom TPN at goal rate of 100 ml/hr provides: - 130 g/day protein ( 54  g/L) - 72 g/day Lipid  ( 30 g/L) - 336  g/day Dextrose ( 14 %) - 2381 Kcal/day  PLAN  At 1800 today:  Advance TPN to 100 ml/hr, goal rate  See goals above  Electrolytes in TPN: Standard except increase K to 65 meq/L, continue Phos at 20 mmol/L; contiinue Cl:Ac ratio 1:2  TPN to contain standard multivitamins and trace elements.  Sensitive Novolog SSI q6h  TPN lab panels on Mondays & Thursdays.  F/u daily.  BMET, Mag, Phos in am  Minda Ditto PharmD Pager [phone] 11/06/2018, 11:24 AM

## 2018-11-06 NOTE — Progress Notes (Signed)
Brief GI progress note  Patient not formally evaluated as his numbers continue to be neutropenic with thrombocytopenia. We will be monitoring his counts peripherally and when he has manifested an Stella greater than [phone] for at least 2 days and his platelet count is above 50,000 we can consider a diagnostic endoscopy for odynophagia/dysphagia. Please let us know if we can be of assistance sooner than this.   Justice Britain, MD [facility] Advanced Endoscopy Office # [phone]

## 2018-11-06 NOTE — Plan of Care (Signed)
  Problem: Health Behavior/Discharge Planning: Goal: Ability to manage health-related needs will improve Outcome: Progressing   Problem: Clinical Measurements: Goal: Ability to maintain clinical measurements within normal limits will improve Outcome: Progressing Goal: Will remain free from infection Outcome: Progressing Goal: Diagnostic test results will improve Outcome: Progressing Goal: Respiratory complications will improve Outcome: Progressing   Problem: Activity: Goal: Risk for activity intolerance will decrease Outcome: Progressing   Problem: Nutrition: Goal: Adequate nutrition will be maintained Outcome: Progressing   Problem: Pain Managment: Goal: General experience of comfort will improve Outcome: Progressing   Problem: Safety: Goal: Ability to remain free from injury will improve Outcome: Progressing

## 2018-11-06 NOTE — Progress Notes (Signed)
[facility] PROGRESS NOTE  Andrew West HEN:[id number] DOB: 12/08/1965 DOA: 10/31/2018 PCP: [name], MD  Brief summary   52 year old with past medical history relevant for small cell lung cancer, right paratracheal mass,  on chemotherapy and radiation (radiation 1 week ago, chemotherapy 3 weeks ago), COPD, BPH, stage III CKD, grade 1 diastolic dysfunction by echo on 02/26/2018, coronary artery disease status post stents (multiple vessel disease by cardiac catheterization on 12/16/2016, felt to be poor candidate for further intervention except for possibly CABG), who presents with dysphasia thought to be secondary to a combination of radiation esophagitis and/or possible extrinsic compression of esophagus from adenopathy and AKI.  Assessment/Plan:  Pneumonia/neutropenic fever: Patient continues to have high fevers.  Chest x-ray showed evidence of pneumonia. continue IV cefepime 11/02/2018, continue IV vancomycin 11/04/2018. Blood cultures  10/31/2018 no growth to date. Urine culture  11/02/2018 no growth to date. Will repeat blood cultures today. neurotopenic precautions.   Dysphagia/nausea/vomiting: Because of his severe nutritional depletion he was started on TPN. He is currently not a candidate for an EGD or placement of an NG tube due to his profound neutropenia and thrombocytopenia. [facility] following, pending EGD once neutropenia/thrombocytopenia are improving. TPN started 11/04/2018, pharmacy consult  Acute on chronic stage III CKD: Resolved. Avoid nephrotoxins  Pancytopenia, thrombocytopenia likely neutropenic secondary to chemotherapy. Hold clopidogrel and subcutaneous prophylaxis while platelets are below 50. Will TF platelts, start Neupogen   Hypertension/hyperlipidemia: Continue amlodipine 10 mg daily. Continue isosorbide mononitrate 120 mg daily. Continue nebivolol 20 mg daily Continue rosuvastatin 20 mg daily-continue hydralazine 25 mg twice  daily  Coronary artery disease status post stent: Patient apparently is no longer a candidate for any nonsurgical interventions.  hold clopidogrel 75 mg daily due to severe thrombocytopenia risk of bleeding, continue beta-blocker, long-acting nitrate Continue statin Continue ranolazine thousand milligrams twice daily  COPD:. Continue LAMA/ICS/LABA. Continue PRN short-acting bronchodilators  Small cell lung cancer: Patient apparently is completed 2 cycles of cisplatin and and etoposide as well as radiotherapy. consult oncology, Dr. Shirline Frees  BPH: Continue prazosin  Pain/psych: Continue dronabinol. Continue fluoxetine 40 mg daily  Code Status: full Family Communication: d/w patient, his family, RN (indicate person spoken with, relationship, and if by phone, the number) Disposition Plan: pend clinical improvement. Remains inpatient    Consultants:  GI  Procedures:  none  Antibiotics: Anti-infectives (From admission, onward)   Start     Dose/Rate Route Frequency Ordered Stop   11/05/18 0900  vancomycin (VANCOCIN) IVPB 1000 mg/200 mL premix     1,000 mg 200 mL/hr over 60 Minutes Intravenous Every 24 hours 11/04/18 0940     11/04/18 0900  vancomycin (VANCOCIN) 2,000 mg in sodium chloride 0.9 % 500 mL IVPB     2,000 mg 250 mL/hr over 120 Minutes Intravenous  Once 11/04/18 0751 11/04/18 1103   11/02/18 0800  ceFEPIme (MAXIPIME) 2 g in sodium chloride 0.9 % 100 mL IVPB     2 g 200 mL/hr over 30 Minutes Intravenous Every 12 hours 11/02/18 0734     11/02/18 0730  ceFEPIme (MAXIPIME) 1 g in sodium chloride 0.9 % 100 mL IVPB  Status:  Discontinued     1 g 200 mL/hr over 30 Minutes Intravenous Every 8 hours 11/02/18 0724 11/02/18 0734        (indicate start date, and stop date if known)  HPI/Subjective: Reports productive cough. Vomiting, no acute abdominal pains. + flatus. Febrile overnight.   Objective: Vitals:   11/06/18 0617 11/06/18 3614  BP: 132/82   Pulse: 84    Resp: 16   Temp:    SpO2: 97% 96%    Intake/Output Summary (Last 24 hours) at 11/06/2018 0852 Last data filed at 11/06/2018 0700 Gross per 24 hour  Intake 2319.21 ml  Output 3150 ml  Net -830.79 ml   Filed Weights   10/31/18 2013  Weight: 95 kg    Exam:   General:  No acute distress   Cardiovascular: s1,s2 rrr  Respiratory: fe wrales   Abdomen: soft, nt   Musculoskeletal: no leg edema    Data Reviewed: Basic Metabolic Panel: Recent Labs  Lab 11/02/18 0321 11/03/18 0307 11/04/18 0455 11/05/18 0427 11/06/18 0425  NA 143 144 143 144 143  K 4.1 4.0 3.7 3.3* 3.4*  CL 111 113* 113* 112* 113*  CO2 24 23 24 22 24   GLUCOSE 85 84 123* 124* 131*  BUN 30* 26* 21* 20 24*  CREATININE 2.00* 1.93* 1.89* 1.84* 1.75*  CALCIUM 8.2* 8.1* 8.2* 8.2* 8.1*  MG 1.8  --  1.5* 1.6* 1.9  PHOS 2.7  --  2.8 2.3* 2.5   Liver Function Tests: Recent Labs  Lab 10/31/18 2028 11/03/18 0307 11/04/18 0455 11/05/18 0427 11/06/18 0425  AST 15 16 15 20  14*  ALT 12 16 16 19 17   ALKPHOS 40 40 43 57 53  BILITOT 1.0 1.0 1.3* 0.9 0.6  PROT 6.5 5.4* 5.5* 5.8* 5.8*  ALBUMIN 3.1* 2.3* 2.2* 2.2* 2.0*   No results for input(s): LIPASE, AMYLASE in the last 168 hours. No results for input(s): AMMONIA in the last 168 hours. CBC: Recent Labs  Lab 10/31/18 2028 11/01/18 0904 11/02/18 0321 11/03/18 0307 11/04/18 0455 11/06/18 0425  WBC 2.2* 0.8* 0.3* 0.1* 0.1* 0.2*  NEUTROABS 1.9  --   --  0.1* 0.0*  --   HGB 7.7* 6.6* 7.8* 7.6* 7.3* 7.6*  HCT 24.2* 20.9* 24.1* 23.3* 22.5* 22.9*  MCV 84.6 85.7 88.0 85.3 87.2 84.8  PLT 119* 86* 48* 19* 8* 15*   Cardiac Enzymes: No results for input(s): CKTOTAL, CKMB, CKMBINDEX, TROPONINI in the last 168 hours. BNP (last 3 results) Recent Labs    11/29/17 0117  BNP 50.4    ProBNP (last 3 results) No results for input(s): PROBNP in the last 8760 hours.  CBG: Recent Labs  Lab 11/05/18 0608 11/05/18 1221 11/05/18 1751 11/06/18 0002  11/06/18 0619  GLUCAP 115* 134* 123* 110* 117*    Recent Results (from the past 240 hour(s))  Blood culture (routine x 2)     Status: None   Collection Time: 10/31/18  8:29 PM  Result Value Ref Range Status   Specimen Description   Final    BLOOD RIGHT HAND Performed at [facility], [address] [place], [place] [address]    Special Requests   Final    BOTTLES DRAWN AEROBIC AND ANAEROBIC Blood Culture results may not be optimal due to an excessive volume of blood received in culture bottles Performed at [facility], [address] [place], [place] [address]    Culture   Final    NO GROWTH 5 DAYS Performed at [facility], [address], [place], [place] [address]    Report Status 11/06/2018 FINAL  Final  Blood culture (routine x 2)     Status: None   Collection Time: 10/31/18  8:34 PM  Result Value Ref Range Status   Specimen Description   Final  BLOOD LEFT HAND Performed at [facility], [address] [place], [place] [address]    Special Requests   Final    BOTTLES DRAWN AEROBIC AND ANAEROBIC Blood Culture results may not be optimal due to an excessive volume of blood received in culture bottles Performed at [facility], [address], [place], [place] [address]    Culture   Final    NO GROWTH 5 DAYS Performed at [facility], [address] [place], [place] [address]    Report Status 11/06/2018 FINAL  Final  Culture, Urine     Status: Abnormal   Collection Time: 11/02/18  4:27 PM  Result Value Ref Range Status   Specimen Description   Final    URINE, RANDOM Performed at [facility], [address], [place], [place] [address]    Special Requests   Final    NONE Performed at [facility], [address], [place], [place] [address]    Culture (A)  Final    <10,000 COLONIES/mL INSIGNIFICANT GROWTH Performed at [facility], [address]  [place], [place] [address]    Report Status 11/03/2018 FINAL  Final  MRSA PCR Screening     Status: None   Collection Time: 11/02/18  5:49 PM  Result Value Ref Range Status   MRSA by PCR NEGATIVE NEGATIVE Final    Comment:        The GeneXpert MRSA Assay (FDA approved for NASAL specimens only), is one component of a comprehensive MRSA colonization surveillance program. It is not intended to diagnose MRSA infection nor to guide or monitor treatment for MRSA infections. Performed at [facility], [address], [place], [place] [address]      Studies: No results found.  Scheduled Meds: . sodium chloride   Intravenous Once  . amLODipine  10 mg Oral Daily  . aspirin  81 mg Oral Daily  . dronabinol  5 mg Oral BID AC  . feeding supplement  1 Container Oral Q24H  . feeding supplement (ENSURE ENLIVE)  237 mL Oral BID BM  . FLUoxetine  40 mg Oral Daily  . insulin aspart  0-9 Units Subcutaneous Q6H  . isosorbide mononitrate  120 mg Oral Daily  . mirtazapine  30 mg Oral QHS  . mometasone-formoterol  2 puff Inhalation BID  . multivitamin with minerals  1 tablet Oral Daily  . nebivolol  20 mg Oral Daily  . nystatin  5 mL Oral QID  . pantoprazole  40 mg Oral Daily  . prazosin  1 mg Oral QHS  . ranolazine  1,000 mg Oral BID  . rosuvastatin  20 mg Oral Daily  . sodium chloride flush  10-40 mL Intracatheter Q12H  . tiotropium  18 mcg Inhalation Daily   Continuous Infusions: . ceFEPime (MAXIPIME) IV 2 g (11/06/18 0848)  . TPN ADULT (ION) 70 mL/hr at 11/06/18 0200  . vancomycin 1,000 mg (11/05/18 1035)    Active Problems:   Diabetes mellitus (HCC)   CAD S/P multiple PCI's   AKI (acute kidney injury) (HCC)    Time spent: >35 minutes     Esperanza Sheets  [facility] Pager [phone]. If 7PM-7AM, please contact night-coverage at [email or web address], password [facility] 11/06/2018, 8:52 AM  LOS: 5 days

## 2018-11-06 NOTE — Progress Notes (Signed)
Subjective: The patient is seen and examined today.  His wife was at the bedside.  This is a very pleasant 52 years old African-American male who was recently diagnosed with limited stage small cell lung cancer and he is currently undergoing systemic chemotherapy with cisplatin and etoposide.  This was concurrent with radiation during the first 2 cycles.  He status post a total of 3 cycles of treatment.  He was admitted recently to the hospital with generalized weakness as well as vomiting and abdominal pain.  Imaging studies showed no acute intra-abdominal or pelvic pathology and normal bowel obstruction or active inflammation.  He was also have acute renal insufficiency.  He was admitted for treatment of questionable pneumonia and started on IV cefepime.  The patient has pancytopenia and I was consulted for evaluation and recommendation regarding his condition.  Objective: Vital signs in last 24 hours: Temp:  [98.1 F (36.7 C)-99.9 F (37.7 C)] 98.1 F (36.7 C) (12/16 1600) Pulse Rate:  [79-86] 86 (12/16 1600) Resp:  [14-20] 14 (12/16 1600) BP: (106-132)/(74-82) 106/74 (12/16 1600) SpO2:  [93 %-98 %] 94 % (12/16 1600)  Intake/Output from previous day: 12/15 0701 - 12/16 0700 In: 2319.2 [P.O.:480; I.V.:940.6; IV Piggyback:898.6] Out: 3150 [Urine:3150] Intake/Output this shift: Total I/O In: 961.8 [I.V.:560.1; Blood:221.3; IV Piggyback:180.4] Out: 400 [Urine:400]  General appearance: alert, cooperative, fatigued and no distress Resp: clear to auscultation bilaterally Cardio: regular rate and rhythm, S1, S2 normal, no murmur, click, rub or gallop GI: soft, non-tender; bowel sounds normal; no masses,  no organomegaly Extremities: extremities normal, atraumatic, no cyanosis or edema  Lab Results:  Recent Labs    11/04/18 0455 11/06/18 0425  WBC 0.1* 0.2*  HGB 7.3* 7.6*  HCT 22.5* 22.9*  PLT 8* 15*   BMET Recent Labs    11/05/18 0427 11/06/18 0425  NA 144 143  K 3.3* 3.4*   CL 112* 113*  CO2 22 24  GLUCOSE 124* 131*  BUN 20 24*  CREATININE 1.84* 1.75*  CALCIUM 8.2* 8.1*    Studies/Results: No results found.  Medications: I have reviewed the patient's current medications.   Assessment/Plan: This is a very pleasant 52 years old African-American male recently diagnosed with limited stage small cell lung cancer and he is currently undergoing systemic chemotherapy with cisplatin and [place] site that was initially concurrent with radiation.  His radiation therapy is complete. The patient was admitted with nausea vomiting as well as abdominal pain.  He also has dehydration and currently being treated for pneumonia. He has chemotherapy-induced pancytopenia.  I recommended for the patient to receive PRBCs transfusion if his hemoglobin is less than 8.0G/DL.  He will also receive platelets 1 unit for any platelets count less than 20,000. For the chemotherapy-induced neutropenia, I will start the patient on Granix 480 mcg subcu daily until his absolute neutrophil count is over 1000. For the renal insufficiency, continue IV hydration. For the suspicious pneumonia continue his current treatment with cefepime. Thank you so much for taking good care of Mr. Heinkel, I will continue to follow up the patient with you and assist in his management on as-needed basis.   LOS: 5 days    Eilleen Kempf 11/06/2018

## 2018-11-06 NOTE — Care Management Important Message (Signed)
Important Message  Patient Details  Name: Andrew West MRN: [id number] Date of Birth: 04/24/1966   Medicare Important Message Given:  Yes    [name] 11/06/2018, 3:11 PMImportant Message  Patient Details  Name: Andrew West MRN: [id number] Date of Birth: 04/26/1966   Medicare Important Message Given:  Yes    [name] 11/06/2018, 3:11 PM

## 2018-11-07 DIAGNOSIS — J449 Chronic obstructive pulmonary disease, unspecified: Secondary | ICD-10-CM

## 2018-11-07 LAB — CBC
HCT: 22.3 % — ABNORMAL LOW (ref 39.0–52.0)
Hemoglobin: 7.4 g/dL — ABNORMAL LOW (ref 13.0–17.0)
MCH: 28 pg (ref 26.0–34.0)
MCHC: 33.2 g/dL (ref 30.0–36.0)
MCV: 84.5 fL (ref 80.0–100.0)
NRBC: 0 % (ref 0.0–0.2)
Platelets: 67 10*3/uL — ABNORMAL LOW (ref 150–400)
RBC: 2.64 MIL/uL — ABNORMAL LOW (ref 4.22–5.81)
RDW: 14.5 % (ref 11.5–15.5)
WBC: 0.4 10*3/uL — CL (ref 4.0–10.5)

## 2018-11-07 LAB — GLUCOSE, CAPILLARY
Glucose-Capillary: 118 mg/dL — ABNORMAL HIGH (ref 70–99)
Glucose-Capillary: 120 mg/dL — ABNORMAL HIGH (ref 70–99)
Glucose-Capillary: 123 mg/dL — ABNORMAL HIGH (ref 70–99)
Glucose-Capillary: 125 mg/dL — ABNORMAL HIGH (ref 70–99)
Glucose-Capillary: 130 mg/dL — ABNORMAL HIGH (ref 70–99)

## 2018-11-07 LAB — BPAM PLATELET PHERESIS
BLOOD PRODUCT EXPIRATION DATE: 201912162359
Blood Product Expiration Date: 201912182359
ISSUE DATE / TIME: 201912161214
ISSUE DATE / TIME: 201912161538
Unit Type and Rh: 6200
Unit Type and Rh: 6200

## 2018-11-07 LAB — PREPARE PLATELET PHERESIS
UNIT DIVISION: 0
Unit division: 0

## 2018-11-07 LAB — BASIC METABOLIC PANEL
Anion gap: 9 (ref 5–15)
BUN: 27 mg/dL — ABNORMAL HIGH (ref 6–20)
CO2: 23 mmol/L (ref 22–32)
Calcium: 8 mg/dL — ABNORMAL LOW (ref 8.9–10.3)
Chloride: 112 mmol/L — ABNORMAL HIGH (ref 98–111)
Creatinine, Ser: 1.57 mg/dL — ABNORMAL HIGH (ref 0.61–1.24)
GFR calc Af Amer: 58 mL/min — ABNORMAL LOW (ref >60)
GFR, EST NON AFRICAN AMERICAN: 50 mL/min — AB (ref >60)
Glucose, Bld: 140 mg/dL — ABNORMAL HIGH (ref 70–99)
Potassium: 3.2 mmol/L — ABNORMAL LOW (ref 3.5–5.1)
Sodium: 144 mmol/L (ref 135–145)

## 2018-11-07 LAB — PREPARE RBC (CROSSMATCH)

## 2018-11-07 LAB — PHOSPHORUS: Phosphorus: 2.9 mg/dL (ref 2.5–4.6)

## 2018-11-07 LAB — MAGNESIUM: Magnesium: 1.6 mg/dL — ABNORMAL LOW (ref 1.7–2.4)

## 2018-11-07 MED ORDER — SODIUM CHLORIDE 0.9% IV SOLUTION
Freq: Once | INTRAVENOUS | Status: AC
  Administered 2018-11-07: 17:00:00 via INTRAVENOUS

## 2018-11-07 MED ORDER — MAGNESIUM SULFATE 4 GM/100ML IV SOLN
4.0000 g | Freq: Once | INTRAVENOUS | Status: AC
  Administered 2018-11-07: 4 g via INTRAVENOUS
  Filled 2018-11-07: qty 100

## 2018-11-07 MED ORDER — TRAVASOL 10 % IV SOLN
INTRAVENOUS | Status: DC
  Administered 2018-11-07: 18:00:00 via INTRAVENOUS
  Filled 2018-11-07: qty 1296

## 2018-11-07 MED ORDER — VANCOMYCIN HCL 10 G IV SOLR
1250.0000 mg | INTRAVENOUS | Status: DC
  Filled 2018-11-07: qty 1250

## 2018-11-07 MED ORDER — POTASSIUM CHLORIDE 10 MEQ/100ML IV SOLN
10.0000 meq | INTRAVENOUS | Status: AC
  Administered 2018-11-07 (×4): 10 meq via INTRAVENOUS
  Filled 2018-11-07 (×4): qty 100

## 2018-11-07 NOTE — Progress Notes (Signed)
Patient ID: Andrew West, male   DOB: 12-29-1965, 52 y.o.   MRN: [id number]   Brief GI note ;  Continuing to follow peripherally  WBC 0.4 this am, plts up to 67.  Plan is for EGD later this week to evaluate probable radiation induced stricture , would like WBC 1000 prior to proceeding.

## 2018-11-07 NOTE — Progress Notes (Signed)
Nutrition Follow-up  INTERVENTION:   -TPN per Pharmacy  -Continue Ensure Enlive po BID, each supplement provides 350 kcal and 20 grams of protein -Continue Boost Breeze po daily, each supplement provides 250 kcal and 9 grams of protein  -Pt needs new weight measurement  NUTRITION DIAGNOSIS:   Increased nutrient needs related to chronic illness, catabolic illness, cancer and cancer related treatments as evidenced by estimated needs.  Ongoing.  GOAL:   Patient will meet greater than or equal to 90% of their needs  Meeting with TPN.  MONITOR:   PO intake, Supplement acceptance, Weight trends, Labs, Other (Comment)(TPN regimen)  ASSESSMENT:   52 year old with past medical history relevant for small cell lung cancer on chemotherapy and radiation (radiation 1 week ago, chemotherapy 3 weeks ago), COPD, BPH, stage III CKD, grade 1 diastolic dysfunction by echo on 02/2018, CAD s/p stents. He presented to the ED with dysphasia thought to be 2/2 a combination of radiation esophagitis and/or possible extrinsic compression of esophagus from adenopathy; also dx with AKI.  12/12: Soft diet started. 12/14: TPN initiated at 40 ml/hr. Slow advancement given refeeding risk.   Patient now receiving TPN at goal rate at 100 ml/hr-providing 2380 kcal and 129g protein. Pt is drinking his Ensure and Boost Breeze supplements (in total this would provide 950 kcal and 49g protein). Not taking in much solid food. Per GI note, pt will be scheduled for a EGD regarding his radiation esophagitis this week.  Last weight recorded 12/10.   Medications: Remeron tablet daily, Multivitamin with minerals daily, Senokot tablet daily,  IV Mg sulfate once, IV KCl today, IV Zofran PRN Labs reviewed: CBGs: 118-125 Low K, Mg Phos WNL GFR: 58  Diet Order:   Diet Order            DIET SOFT Room service appropriate? Yes; Fluid consistency: Thin  Diet effective now              EDUCATION NEEDS:   Not  appropriate for education at this time  Skin:  Skin Assessment: Reviewed RN Assessment  Last BM:  12/16  Height:   Ht Readings from Last 1 Encounters:  10/31/18 5\' 9"  (1.753 m)    Weight:   Wt Readings from Last 1 Encounters:  10/31/18 95 kg    Ideal Body Weight:  72.73 kg  BMI:  Body mass index is 30.93 kg/m.  Estimated Nutritional Needs:   Kcal:  7290-2111 kcal  Protein:  125-135 grams  Fluid:  >/= 2.2 L/day  Clayton Bibles, MS, RD, LDN [facility] Dietitian Pager: [phone] After Hours Pager: [phone]

## 2018-11-07 NOTE — Progress Notes (Signed)
[facility] PROGRESS NOTE  Andrew West ZHY:[id number] DOB: 20-Dec-1965 DOA: 10/31/2018 PCP: [name], MD  Brief summary   52 year old with past medical history relevant for small cell lung cancer, right paratracheal mass,  on chemotherapy and radiation (radiation 1 week ago, chemotherapy 3 weeks ago), COPD, BPH, stage III CKD, grade 1 diastolic dysfunction by echo on 02/26/2018, coronary artery disease status post stents (multiple vessel disease by cardiac catheterization on 12/16/2016, felt to be poor candidate for further intervention except for possibly CABG), who presents with dysphasia thought to be secondary to a combination of radiation esophagitis and/or possible extrinsic compression of esophagus from adenopathy and AKI.  Assessment/Plan:  Pneumonia/neutropenic fever: Patient continues to have high fevers.  Chest x-ray showed evidence of pneumonia. continue IV cefepime 11/02/2018, continue IV vancomycin 11/04/2018. Blood cultures  10/31/2018 no growth to date. Urine culture  11/02/2018 no growth to date. pend blood cultures 11/06/18. neurotopenic precautions.   Dysphagia/nausea/vomiting: Because of his severe nutritional depletion he was started on TPN. He is currently not a candidate for an EGD or placement of an NG tube due to his profound neutropenia and thrombocytopenia. [facility] following, pending EGD once neutropenia/thrombocytopenia are improving. TPN started 11/04/2018, pharmacy consult  Acute on chronic stage III CKD: Resolved. Avoid nephrotoxins  Pancytopenia, thrombocytopenia likely neutropenic secondary to chemotherapy. Hold clopidogrel and subcutaneous prophylaxis while platelets are below 50.  TFsed platelets on 12/16. Will TF PRBC today. TF as needed, started Neupogen   Hypertension/hyperlipidemia: Continue amlodipine 10 mg daily. Continue isosorbide mononitrate 120 mg daily. Continue nebivolol 20 mg daily Continue rosuvastatin 20 mg  daily-continue hydralazine 25 mg twice daily  Coronary artery disease status post stent: Patient apparently is no longer a candidate for any nonsurgical interventions.  hold clopidogrel 75 mg daily due to severe thrombocytopenia risk of bleeding, continue beta-blocker, long-acting nitrate Continue statin Continue ranolazine thousand milligrams twice daily  COPD:. Continue LAMA/ICS/LABA. Continue PRN short-acting bronchodilators  Small cell lung cancer: Patient apparently is completed 2 cycles of cisplatin and and etoposide as well as radiotherapy. consult oncology, Dr. Shirline Frees  BPH: Continue prazosin  Code Status: full Family Communication: d/w patient, his family, RN (indicate person spoken with, relationship, and if by phone, the number) Disposition Plan: pend clinical improvement. Remains inpatient    Consultants:  GI  Procedures:  none  Antibiotics: Anti-infectives (From admission, onward)   Start     Dose/Rate Route Frequency Ordered Stop   11/05/18 0900  vancomycin (VANCOCIN) IVPB 1000 mg/200 mL premix     1,000 mg 200 mL/hr over 60 Minutes Intravenous Every 24 hours 11/04/18 0940     11/04/18 0900  vancomycin (VANCOCIN) 2,000 mg in sodium chloride 0.9 % 500 mL IVPB     2,000 mg 250 mL/hr over 120 Minutes Intravenous  Once 11/04/18 0751 11/04/18 1103   11/02/18 0800  ceFEPIme (MAXIPIME) 2 g in sodium chloride 0.9 % 100 mL IVPB     2 g 200 mL/hr over 30 Minutes Intravenous Every 12 hours 11/02/18 0734     11/02/18 0730  ceFEPIme (MAXIPIME) 1 g in sodium chloride 0.9 % 100 mL IVPB  Status:  Discontinued     1 g 200 mL/hr over 30 Minutes Intravenous Every 8 hours 11/02/18 0724 11/02/18 0734       (indicate start date, and stop date if known)  HPI/Subjective: He wants to go home. I have d/w patient, his family and recommended to cont inpatient treatment.    Objective: Vitals:  11/06/18 2142 11/07/18 0805  BP: 117/78   Pulse: 85   Resp: 17   Temp: 99.7 F  (37.6 C)   SpO2: 95% 96%    Intake/Output Summary (Last 24 hours) at 11/07/2018 0958 Last data filed at 11/07/2018 0600 Gross per 24 hour  Intake 3564.51 ml  Output 1852 ml  Net 1712.51 ml   Filed Weights   10/31/18 2013  Weight: 95 kg    Exam:   General:  No acute distress   Cardiovascular: s1,s2 rrr  Respiratory: fe wrales   Abdomen: soft, nt   Musculoskeletal: no leg edema    Data Reviewed: Basic Metabolic Panel: Recent Labs  Lab 11/02/18 0321 11/03/18 0307 11/04/18 0455 11/05/18 0427 11/06/18 0425 11/07/18 0508  NA 143 144 143 144 143 144  K 4.1 4.0 3.7 3.3* 3.4* 3.2*  CL 111 113* 113* 112* 113* 112*  CO2 24 23 24 22 24 23   GLUCOSE 85 84 123* 124* 131* 140*  BUN 30* 26* 21* 20 24* 27*  CREATININE 2.00* 1.93* 1.89* 1.84* 1.75* 1.57*  CALCIUM 8.2* 8.1* 8.2* 8.2* 8.1* 8.0*  MG 1.8  --  1.5* 1.6* 1.9 1.6*  PHOS 2.7  --  2.8 2.3* 2.5 2.9   Liver Function Tests: Recent Labs  Lab 10/31/18 2028 11/03/18 0307 11/04/18 0455 11/05/18 0427 11/06/18 0425  AST 15 16 15 20  14*  ALT 12 16 16 19 17   ALKPHOS 40 40 43 57 53  BILITOT 1.0 1.0 1.3* 0.9 0.6  PROT 6.5 5.4* 5.5* 5.8* 5.8*  ALBUMIN 3.1* 2.3* 2.2* 2.2* 2.0*   No results for input(s): LIPASE, AMYLASE in the last 168 hours. No results for input(s): AMMONIA in the last 168 hours. CBC: Recent Labs  Lab 10/31/18 2028  11/02/18 0321 11/03/18 0307 11/04/18 0455 11/06/18 0425 11/07/18 0508  WBC 2.2*   < > 0.3* 0.1* 0.1* 0.2* 0.4*  NEUTROABS 1.9  --   --  0.1* 0.0*  --   --   HGB 7.7*   < > 7.8* 7.6* 7.3* 7.6* 7.4*  HCT 24.2*   < > 24.1* 23.3* 22.5* 22.9* 22.3*  MCV 84.6   < > 88.0 85.3 87.2 84.8 84.5  PLT 119*   < > 48* 19* 8* 15* 67*   < > = values in this interval not displayed.   Cardiac Enzymes: No results for input(s): CKTOTAL, CKMB, CKMBINDEX, TROPONINI in the last 168 hours. BNP (last 3 results) Recent Labs    11/29/17 0117  BNP 50.4    ProBNP (last 3 results) No results for  input(s): PROBNP in the last 8760 hours.  CBG: Recent Labs  Lab 11/06/18 0619 11/06/18 1204 11/06/18 1811 11/07/18 0029 11/07/18 0632  GLUCAP 117* 126* 104* 118* 125*    Recent Results (from the past 240 hour(s))  Blood culture (routine x 2)     Status: None   Collection Time: 10/31/18  8:29 PM  Result Value Ref Range Status   Specimen Description   Final    BLOOD RIGHT HAND Performed at [facility], [address], [place], [place] [address]    Special Requests   Final    BOTTLES DRAWN AEROBIC AND ANAEROBIC Blood Culture results may not be optimal due to an excessive volume of blood received in culture bottles Performed at [facility], [address], [place], [place] [address]    Culture   Final    NO GROWTH 5 DAYS Performed at [facility], [address] [place], [place] [address]    Report Status 11/06/2018 FINAL  Final  Blood culture (routine x 2)     Status: None   Collection Time: 10/31/18  8:34 PM  Result Value Ref Range Status   Specimen Description   Final    BLOOD LEFT HAND Performed at [facility], [address] [place], [place] [address]    Special Requests   Final    BOTTLES DRAWN AEROBIC AND ANAEROBIC Blood Culture results may not be optimal due to an excessive volume of blood received in culture bottles Performed at [facility], [address], [place], [place] [address]    Culture   Final    NO GROWTH 5 DAYS Performed at [facility], [address] [place], [place] [address]    Report Status 11/06/2018 FINAL  Final  Culture, Urine     Status: Abnormal   Collection Time: 11/02/18  4:27 PM  Result Value Ref Range Status   Specimen Description   Final    URINE, RANDOM Performed at [facility], [address], [place], [place] [address]    Special Requests   Final    NONE Performed at [facility], [address], [place], [place]  [address]    Culture (A)  Final    <10,000 COLONIES/mL INSIGNIFICANT GROWTH Performed at [facility], [address], [place], [place] [address]    Report Status 11/03/2018 FINAL  Final  MRSA PCR Screening     Status: None   Collection Time: 11/02/18  5:49 PM  Result Value Ref Range Status   MRSA by PCR NEGATIVE NEGATIVE Final    Comment:        The GeneXpert MRSA Assay (FDA approved for NASAL specimens only), is one component of a comprehensive MRSA colonization surveillance program. It is not intended to diagnose MRSA infection nor to guide or monitor treatment for MRSA infections. Performed at [facility], [address], [place], [place] [address]   Culture, blood (routine x 2)     Status: None (Preliminary result)   Collection Time: 11/06/18  9:39 AM  Result Value Ref Range Status   Specimen Description   Final    BLOOD RIGHT HAND Performed at [facility], [address], [place], [place] [address]    Special Requests   Final    BAA BCAV Performed at [facility], [address] [place], [place] [address]    Culture   Final    NO GROWTH < 24 HOURS Performed at [facility], [address] [place], [place] [address]    Report Status PENDING  Incomplete  Culture, blood (routine x 2)     Status: None (Preliminary result)   Collection Time: 11/06/18  9:54 AM  Result Value Ref Range Status   Specimen Description   Final    BLOOD RIGHT ARM Performed at [facility], [address], [place], [place] [address]    Special Requests   Final    AEB BCAV Performed at [facility], [address] [place], [place] [address]    Culture   Final    NO GROWTH < 24 HOURS Performed at [facility], [address], [place], [place] [address]    Report Status PENDING  Incomplete     Studies: No results found.  Scheduled  Meds: . amLODipine  10 mg Oral Daily  .  dronabinol  5 mg Oral BID AC  . feeding supplement  1 Container Oral Q24H  . feeding supplement (ENSURE ENLIVE)  237 mL Oral BID BM  . FLUoxetine  40 mg Oral Daily  . insulin aspart  0-9 Units Subcutaneous Q6H  . isosorbide mononitrate  120 mg Oral Daily  . mirtazapine  30 mg Oral QHS  . mometasone-formoterol  2 puff Inhalation BID  . multivitamin with minerals  1 tablet Oral Daily  . nebivolol  20 mg Oral Daily  . nystatin  5 mL Oral QID  . pantoprazole  40 mg Oral Daily  . prazosin  1 mg Oral QHS  . ranolazine  1,000 mg Oral BID  . rosuvastatin  20 mg Oral Daily  . senna  1 tablet Oral Daily  . sodium chloride flush  10-40 mL Intracatheter Q12H  . Tbo-filgastrim (GRANIX) SQ  480 mcg Subcutaneous q1800  . tiotropium  18 mcg Inhalation Daily   Continuous Infusions: . ceFEPime (MAXIPIME) IV 2 g (11/07/18 0804)  . magnesium sulfate 1 - 4 g bolus IVPB 4 g (11/07/18 0944)  . potassium chloride 10 mEq (11/07/18 0850)  . TPN ADULT (ION) 100 mL/hr at 11/07/18 0200  . vancomycin 1,000 mg (11/07/18 0957)    Active Problems:   Diabetes mellitus (HCC)   CAD S/P multiple PCI's   AKI (acute kidney injury) (HCC)    Time spent: >35 minutes     Esperanza Sheets  [facility] Pager [phone]. If 7PM-7AM, please contact night-coverage at [email or web address], password [facility] 11/07/2018, 9:57 AM  LOS: 6 days

## 2018-11-07 NOTE — Progress Notes (Signed)
Pharmacy Antibiotic Note  ANDYN SALES is a 52 y.o. male admitted on 10/31/2018 with vomiting and abdominal pain. Pharmacy has been consulted for vancomycin dosing.  PMH significant for SCLC on chemotherapy and radiation, COPD, CKD. Currently neutropenia and on cefepime for PNA and started on TPN for dysphagia and malnutrition.   Today, 11/07/18  WBC 0.4 (12/17), ANC 0 (12/16))  SCr 1.57, CrCl ~63 mL/min  Day 5 full abx, first and repeat blood cx negative  Plan:  Continue cefepime 2 g IV q12h  Vancomycin 2000 mg loading dose   Increase vancomycin to 1250 mg IV q24h for goal AUC 400-500  Follow renal function, culture data  Check vancomycin AUC levels at steady state if indicated  Height: 5\' 9"  (175.3 cm) Weight: 209 lb 7 oz (95 kg) IBW/kg (Calculated) : 70.7  Temp (24hrs), Avg:99.1 F (37.3 C), Min:98.1 F (36.7 C), Max:99.9 F (37.7 C)  Recent Labs  Lab 10/31/18 2146  11/02/18 0321 11/03/18 0307 11/04/18 0455 11/05/18 0427 11/06/18 0425 11/07/18 0508  WBC  --    < > 0.3* 0.1* 0.1*  --  0.2* 0.4*  CREATININE  --    < > 2.00* 1.93* 1.89* 1.84* 1.75* 1.57*  LATICACIDVEN 0.75  --   --   --   --   --   --   --    < > = values in this interval not displayed.    Estimated Creatinine Clearance: 62.6 mL/min (A) (by C-G formula based on SCr of 1.57 mg/dL (H)).    Allergies  Allergen Reactions  . Iohexol Anaphylaxis    PT. TO BE PREMEDICATED PRIOR TO IV CONTRAST PER DR Kris Hartmann /MMS//12/15/15Desc: PT BECAME SOB AND CHEST TIGHTNESS AFTER CONTRAST INJECTION.  [name],RT-RCT., Onset Date: [id number]    Antimicrobials this admission: vancomycin 12/14 >>  cefepime 12/12 >>  Nystatin 12/11 >>  Dose adjustments this admission: Empiric increase Vanc 1gm q24 to 1250mg  q24 with improved renal function  Microbiology results: 12/10 BCx x2: ng-final 12/12 FUX:[id number] growth-final 12/12 MRSA PCR: neg 12/16 rpt BCx: ngtd  Thank you for allowing pharmacy to be a  part of this patient's care.  Minda Ditto, PharmD Clinical Pharmacist 11/07/2018 10:19 AM

## 2018-11-07 NOTE — Plan of Care (Signed)
  Problem: Clinical Measurements: Goal: Respiratory complications will improve Outcome: Progressing   Problem: Activity: Goal: Risk for activity intolerance will decrease Outcome: Progressing   Problem: Nutrition: Goal: Adequate nutrition will be maintained Outcome: Progressing   Problem: Clinical Measurements: Goal: Will remain free from infection Outcome: Progressing

## 2018-11-07 NOTE — Progress Notes (Signed)
[facility] NOTE   Pharmacy Consult for TPN Indication: malnutrition, poor oral intake  Patient Measurements: Height: 5\' 9"  (175.3 cm) Weight: 209 lb 7 oz (95 kg) IBW/kg (Calculated) : 70.7 TPN AdjBW (KG): 76.8 Body mass index is 30.93 kg/m. Usual Weight:   Insulin Requirements: 4 units/24 hr on sensitive SSI q6h - no hx DM  Current Nutrition: taking intermittently - Boost 1 container q24h - ensure 237 mL bid - marinol for appetite   IVF: NS at 10 m/hr  Central access: port-a-cath TPN start date: 11/04/18  ASSESSMENT                                                   HPI: Patient is a 52 y.o M with SCLC on chemotherapy and XRT PTA, presented to the ED on 10/31/18 with c/o hemoptysis, generalized body aches, n/v and abd pain.  He reported having difficulty swallowing solid foods. Abd CT on 12/10 was negative for bowel obstruction or active inflammation. 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  NGT is not able to be placed and unable to perform EGD at this time d/t pancytopenia. To start TPN for malnutrition and poor oral intake.  Significant events:  12/16: TPN to goal rate 12/17: po MVI charted 12/16 & 12/17, will leave out of TPN today  Today:   Glucose (goal <150): range 104-126, at goal  Electrolytes: K sl low, Mag & Phos repleted             CL remains slightly elevated at 113 with CL:AC ratio at 1:2  Renal: 1.75, decreasing slowly  LFTs: AST/ALT wnl; Tbili down wnl  TGs: 107 (12/14), 131 (12/16),  Prealbumin: 9.2 (12/14), 6.9 (12/16)  NUTRITIONAL GOALS                                                                                             RD recs (12/13): Kcal:2185-2470 kcal Protein:125-135 grams Fluid:>/= 2.2 L/day  Custom TPN at goal rate of 100 ml/hr provides: - 130 g/day protein ( 54  g/L) - 72 g/day Lipid  ( 30 g/L) - 336  g/day Dextrose ( 14 %) - 2381 Kcal/day  PLAN                                                                                   Mag Sulfate 4gm bolus KCl 10 mEq IVPB x4  At 1800 today:  Continue TPN at 100 ml/hr (goal rate)  See goals above  Electrolytes in TPN in mEq or mMol/L  Na decr to 45 mEq  K incr to 70 mEq  Ca remains at 5  mEq  Mag incr to 6 mEq  Phos decr to standard 15 mMol  Standard multivitamins and trace elements by mouth or per TPN  Sensitive Novolog SSI q6h  TPN lab panels on Mondays & Thursdays.  F/u daily.  BMET, Mag, Phos in am  Minda Ditto PharmD Pager [phone] 11/07/2018, 7:01 AM

## 2018-11-08 ENCOUNTER — Telehealth: Payer: Self-pay | Admitting: Medical Oncology

## 2018-11-08 LAB — CBC WITH DIFFERENTIAL/PLATELET
Abs Immature Granulocytes: 0.18 10*3/uL — ABNORMAL HIGH (ref 0.00–0.07)
Basophils Absolute: 0 10*3/uL (ref 0.0–0.1)
Basophils Relative: 1 %
Eosinophils Absolute: 0 10*3/uL (ref 0.0–0.5)
Eosinophils Relative: 1 %
HCT: 25 % — ABNORMAL LOW (ref 39.0–52.0)
Hemoglobin: 8.2 g/dL — ABNORMAL LOW (ref 13.0–17.0)
Immature Granulocytes: 8 %
Lymphocytes Relative: 7 %
Lymphs Abs: 0.2 10*3/uL — ABNORMAL LOW (ref 0.7–4.0)
MCH: 28.6 pg (ref 26.0–34.0)
MCHC: 32.8 g/dL (ref 30.0–36.0)
MCV: 87.1 fL (ref 80.0–100.0)
Monocytes Absolute: 0.7 10*3/uL (ref 0.1–1.0)
Monocytes Relative: 31 %
Neutro Abs: 1.1 10*3/uL — ABNORMAL LOW (ref 1.7–7.7)
Neutrophils Relative %: 52 %
Platelets: 57 10*3/uL — ABNORMAL LOW (ref 150–400)
RBC: 2.87 MIL/uL — ABNORMAL LOW (ref 4.22–5.81)
RDW: 14.6 % (ref 11.5–15.5)
WBC: 2.2 10*3/uL — ABNORMAL LOW (ref 4.0–10.5)
nRBC: 0 % (ref 0.0–0.2)

## 2018-11-08 LAB — BPAM RBC
Blood Product Expiration Date: 202001032359
ISSUE DATE / TIME: 201912171714
Unit Type and Rh: 6200

## 2018-11-08 LAB — BASIC METABOLIC PANEL
Anion gap: 11 (ref 5–15)
BUN: 32 mg/dL — ABNORMAL HIGH (ref 6–20)
CO2: 23 mmol/L (ref 22–32)
Calcium: 8.2 mg/dL — ABNORMAL LOW (ref 8.9–10.3)
Chloride: 109 mmol/L (ref 98–111)
Creatinine, Ser: 1.45 mg/dL — ABNORMAL HIGH (ref 0.61–1.24)
GFR calc non Af Amer: 55 mL/min — ABNORMAL LOW (ref >60)
Glucose, Bld: 143 mg/dL — ABNORMAL HIGH (ref 70–99)
Potassium: 3.7 mmol/L (ref 3.5–5.1)
SODIUM: 143 mmol/L (ref 135–145)

## 2018-11-08 LAB — TYPE AND SCREEN
ABO/RH(D): A POS
Antibody Screen: NEGATIVE
UNIT DIVISION: 0

## 2018-11-08 LAB — MAGNESIUM: MAGNESIUM: 1.9 mg/dL (ref 1.7–2.4)

## 2018-11-08 LAB — PHOSPHORUS: Phosphorus: 2.7 mg/dL (ref 2.5–4.6)

## 2018-11-08 LAB — GLUCOSE, CAPILLARY: Glucose-Capillary: 131 mg/dL — ABNORMAL HIGH (ref 70–99)

## 2018-11-08 MED ORDER — HEPARIN SOD (PORK) LOCK FLUSH 100 UNIT/ML IV SOLN
500.0000 [IU] | INTRAVENOUS | Status: AC | PRN
  Administered 2018-11-08: 500 [IU]

## 2018-11-08 NOTE — Telephone Encounter (Signed)
D/c from hospital today . Pt called to get lab appt for  tomorrow. CBC better today.Plts sl down. Mohamed to advise.  Next lab 12/23 Per [facility] , he does not need lab until 12/23.

## 2018-11-08 NOTE — Discharge Summary (Signed)
Physician Discharge Summary  Andrew West UXL:[id number] DOB: 13-Nov-1966 DOA: 10/31/2018  PCP: [name], MD  Admit date: 10/31/2018 Discharge date: 11/08/2018  Time spent: >25 minutes  Recommendations for Outpatient Follow-up:   [place]  Discharge Diagnoses:  Active Problems:   Diabetes mellitus (HCC)   CAD S/P multiple PCI's   AKI (acute kidney injury) [facility])   Discharge Condition: AMA  Diet recommendation: [facility]  Filed Weights   10/31/18 2013 11/07/18 1254  Weight: 95 kg 96.5 kg    History of present illness:   52 year old with past medical history relevant for small cell lung cancer, right paratracheal mass,  on chemotherapy and radiation (radiation 1 week ago, chemotherapy 3 weeks ago), COPD, BPH, stage III CKD, grade 1 diastolic dysfunction by echo on 02/26/2018, coronary artery disease status post stents (multiple vessel disease by cardiac catheterization on 12/16/2016, felt to be poor candidate for further intervention except for possibly CABG), who presents with dysphasia thought to be secondary to a combination of radiation esophagitis and/or possible extrinsic compression of esophagus from adenopathy and AKI. He was started on empiric broad spectrum iv antibiotics for pneumonia and neutropenic fever, ongoing further inpatient management. However, he decided to go home again medical advice   -patient signed [facility] Course:   Pneumonia/neutropenic fever: Patient continues to have high fevers. Chest x-ray showed evidence of pneumonia. continued IV cefepime 11/02/2018, continuedIV vancomycin 11/04/2018. Blood cultures 10/31/2018 no growth to date. Urine culture 11/02/2018 no growth to date.  neurotopenic precautions.  -Pt signed AMA  Dysphagia/nausea/vomiting:Because of his severe nutritional depletion he was started on TPN.He is currently not a candidate for an EGD or placement of an NG tube due to his profound neutropenia and thrombocytopenia., planned  for EGD once neutropenia/thrombocytopenia are improving. TPN started 11/04/2018 -Patient signed AMA  Acute on chronic stage III ZDG:[id number].   Pancytopenia, thrombocytopenia likely neutropenicsecondary to chemotherapy. Hold clopidogrel and subcutaneous prophylaxis while platelets are below 50.  TFsed platelets on 12/16. TFsed PRBC today. started Neupogen   Hypertension/hyperlipidemia: Continue amlodipine 10 mg daily. Continue isosorbide mononitrate 120 mg daily. Continue nebivolol 20 mg daily Continue rosuvastatin 20 mg daily-continue hydralazine 25 mg twice daily  Coronary artery disease status post stent: Patient apparently is no longer a candidate for any nonsurgical interventions.  hold clopidogrel 75 mg daily due to severe thrombocytopenia risk of bleeding, continue beta-blocker, long-acting nitrate Continue statin Continue ranolazine thousand milligrams twice daily  COPD:. Continue LAMA/ICS/LABA. Continue PRN short-acting bronchodilators  Small cell lung cancer: Patient apparently is completed 2 cycles of cisplatin and and etoposide as well as radiotherapy. oncology, Dr. Shirline Frees   D/w patient, his family at length. He understood his condition, related complications clearly, including death. He decided to sign AMA  Procedures:  TPN (i.e. Studies not automatically included, echos, thoracentesis, etc; not x-rays)  Consultations:  oncology  GI    Discharge Exam: Vitals:   11/07/18 2227 11/08/18 0533  BP: 107/64 (!) 130/91  Pulse: 79 82  Resp: 15 16  Temp: 98.6 F (37 C) 99.3 F (37.4 C)  SpO2: 100% 97%    General: no distress  Cardiovascular: s1,s2 rrr Respiratory: CTA BL  Discharge Instructions   Allergies as of 11/08/2018      Reactions   Iohexol Anaphylaxis   PT. TO BE PREMEDICATED PRIOR TO IV CONTRAST PER DR Eppie Gibson /MMS//12/15/15Desc: PT BECAME SOB AND CHEST TIGHTNESS AFTER CONTRAST INJECTION.  [name],RT-RCT., Onset Date: [id number]    Med  Rec must be completed prior  to using this SMARTLINK      Allergies  Allergen Reactions  . Iohexol Anaphylaxis    PT. TO BE PREMEDICATED PRIOR TO IV CONTRAST PER DR Eppie Gibson /MMS//12/15/15Desc: PT BECAME SOB AND CHEST TIGHTNESS AFTER CONTRAST INJECTION.  [name],RT-RCT., Onset Date: [id number]       The results of significant diagnostics from this hospitalization (including imaging, microbiology, ancillary and laboratory) are listed below for reference.    Significant Diagnostic Studies: Ct Abdomen Pelvis Wo Contrast  Result Date: 10/31/2018 CLINICAL DATA:  52 year old male with abdominal pain. Concern for acute appendicitis. History of right lung mass. EXAM: CT ABDOMEN AND PELVIS WITHOUT CONTRAST TECHNIQUE: Multidetector CT imaging of the abdomen and pelvis was performed following the standard protocol without IV contrast. COMPARISON:  PET-CT dated 08/21/2018 FINDINGS: Evaluation of this exam is limited in the absence of intravenous contrast. Lower chest: Bibasilar subpleural interstitial coarsening/scarring or atelectasis. Developing infiltrate at the right lung base is less likely but not excluded. Coronary vascular calcification. There is hypoattenuation of the cardiac blood pool suggestive of a degree of anemia. Clinical correlation is recommended. No intra-abdominal free air or free fluid. Hepatobiliary: No focal liver abnormality is seen. No gallstones, gallbladder wall thickening, or biliary dilatation. Pancreas: Unremarkable. No pancreatic ductal dilatation or surrounding inflammatory changes. Spleen: Normal in size without focal abnormality. Adrenals/Urinary Tract: The adrenal glands are unremarkable. There is no hydronephrosis or nephrolithiasis on either side. A 9 mm exophytic hypodense lesion from the lateral inferior pole of the right kidney is not well characterized but may represent a cyst. The visualized ureters and urinary bladder appear unremarkable. Stomach/Bowel: There is  no bowel obstruction or active inflammation. Normal appendix. Vascular/Lymphatic: Moderate aortoiliac atherosclerotic disease. No portal venous gas. There is no adenopathy. Reproductive: The prostate and seminal vesicles are grossly unremarkable. No pelvic mass. Other: Small fat containing umbilical hernia. Musculoskeletal: No acute or significant osseous findings. IMPRESSION: No acute intra-abdominal or pelvic pathology. No bowel obstruction or active inflammation. Normal appendix. Electronically Signed   By: Elgie Collard M.D.   On: 10/31/2018 23:21   Dg Chest 2 View  Result Date: 10/22/2018 CLINICAL DATA:  52 year old male with cough. EXAM: CHEST - 2 VIEW COMPARISON:  Chest CT dated 10/20/2018 FINDINGS: Left pectoral Port-A-Cath with tip in the region of the cavoatrial junction. The lungs are clear. The nodule seen in the right lung base on the prior CT is not visualized this radiograph. There is no pleural effusion or pneumothorax. The cardiac silhouette is within normal limits. Coronary vascular calcification noted. No acute osseous pathology. IMPRESSION: No active cardiopulmonary disease. Electronically Signed   By: Elgie Collard M.D.   On: 10/22/2018 21:36   Ct Chest Wo Contrast  Result Date: 10/20/2018 CLINICAL DATA:  Small cell lung cancer, diagnosed September 2019, chemotherapy/XRT ongoing EXAM: CT CHEST WITHOUT CONTRAST TECHNIQUE: Multidetector CT imaging of the chest was performed following the standard protocol without IV contrast. COMPARISON:  PET-CT dated 08/21/2018.  CT chest dated 07/31/2018. FINDINGS: Cardiovascular: Heart is normal in size.  No pericardial effusion. No evidence of thoracic aortic aneurysm. Atherosclerotic calcifications of the aortic arch. Three vessel coronary atherosclerosis. Mediastinum/Nodes: Mediastinal lymphadenopathy, improved, including: --1.3 cm short axis right paratracheal node (series 2/image 84), previously 3.3 cm --1.0 cm short axis AP window node  (series 2/image 60), previously 1.4 cm --1.5 cm short axis subcarinal node (series 2/image 32), previously 1.9 cm Bilateral hilar regions are poorly evaluated in the absence of intravenous contrast. Visualized thyroid is unremarkable. Lungs/Pleura: Mild  centrilobular and paraseptal emphysematous changes, upper lobe predominant. Very mild ground-glass peribronchovascular nodularity in the right upper lobe (series 5/image 85), nonspecific. 2.3 x 1.2 cm thick-walled cavitary subpleural nodule in the lateral right lung base (series 5/image 115), new. No focal consolidation. No pleural effusion or pneumothorax. Upper Abdomen: Visualized upper abdomen is unremarkable. Musculoskeletal: Mild degenerative changes of the visualized thoracolumbar spine. IMPRESSION: Improving mediastinal lymphadenopathy, as above. Bilateral hilar regions are poorly evaluated in the absence of intravenous contrast. 2.3 x 1.2 cm thick-walled cavitary nodule in the lateral right lung base, new. This may be secondary to radiation change. Attention on follow-up is suggested. Aortic Atherosclerosis (ICD10-I70.0) and Emphysema (ICD10-J43.9). Electronically Signed   By: Charline Bills M.D.   On: 10/20/2018 09:28   Dg Esophagus  Result Date: 10/09/2018 CLINICAL DATA:  Sore throat and chest discomfort with sensation of food sticking. Odynophagia. Stage III lung cancer. EXAM: ESOPHOGRAM / BARIUM SWALLOW / BARIUM TABLET STUDY TECHNIQUE: Combined double contrast and single contrast examination performed using effervescent crystals, thick barium liquid, and thin barium liquid. The patient was observed with fluoroscopy swallowing a 13 mm barium sulphate tablet. FLUOROSCOPY TIME:  Fluoroscopy Time:  2 minutes, 24 seconds Radiation Exposure Index (if provided by the fluoroscopic device): 15.2 mGy Number of Acquired Spot Images: 0 COMPARISON:  PET-CT from 08/21/2018 FINDINGS: During the pharyngeal phase of contrast, there was mild vallecular and piriform  stasis. Primary peristaltic waves were intact on 4/4 swallows. Starting in the upper thoracic esophagus and extending over at about 5.5 cm length, there is a smoothly marginated narrowing of the proximal esophagus at which I could only distend the esophagus up to about 12 mm in one projection in 10 mm and another. The 13 mm barium tablet impacted in this vicinity in would not progressed despite numerous swallows. In addition, I was unable to fully distend the distal esophagus, maximal distention was about 12 mm. Because the pill did not pass from the proximal esophagus, I was unable to challenge the distal esophagus with the pill. There is atherosclerotic calcification of the aortic arch. IMPRESSION: 1. Approximately 5.5 cm smooth narrowing of the proximal esophagus. This is near the region of the patient's right paratracheal adenopathy. The narrowing could be related to radiation therapy or, less likely, mass effect from the paratracheal adenopathy on the trachea causing compression of the esophagus between the trachea and the aorta. 2. I was not able to distend the distal esophagus beyond about 12 mm, and accordingly a smooth narrowing in the distal esophagus is likewise not excluded. 3.  Aortic Atherosclerosis (ICD10-I70.0). Electronically Signed   By: Gaylyn Rong M.D.   On: 10/09/2018 10:12   Dg Chest Port 1 View  Result Date: 11/02/2018 CLINICAL DATA:  51 year old current history of small-cell lung cancer for which the patient is being treated with chemotherapy and radiation therapy, presenting with acute onset of fever. EXAM: PORTABLE CHEST 1 VIEW COMPARISON:  11/02/2018 and earlier, including CT chest 10/20/2018 and PET-CT 08/21/2018. FINDINGS: Cardiac silhouette normal in size for AP technique. Thoracic aorta mildly tortuous and atherosclerotic, unchanged. RIGHT paratracheal mediastinal lymphadenopathy identified on prior examinations has resolved. LEFT jugular Port-A-Cath tip projects over the  mid SVC, unchanged. Patchy airspace opacities at the RIGHT lung base. Lungs otherwise clear. Pulmonary vascularity normal. IMPRESSION: Acute pneumonia involving the RIGHT lung base. Electronically Signed   By: Hulan Saas M.D.   On: 11/02/2018 08:09   Dg Chest Port 1 View  Result Date: 10/31/2018 CLINICAL DATA:  History  of lung cancer, chills EXAM: PORTABLE CHEST 1 VIEW COMPARISON:  10/22/2018, 10/08/2018, CT chest 10/20/2018 FINDINGS: Left-sided central venous port tip over the SVC. No acute consolidation or effusion. Stable cardiomediastinal silhouette. No pneumothorax. IMPRESSION: No active disease. Electronically Signed   By: Jasmine Pang M.D.   On: 10/31/2018 21:09    Microbiology: Recent Results (from the past 240 hour(s))  Blood culture (routine x 2)     Status: None   Collection Time: 10/31/18  8:29 PM  Result Value Ref Range Status   Specimen Description   Final    BLOOD RIGHT HAND Performed at [facility], [address], [place], [place] [address]    Special Requests   Final    BOTTLES DRAWN AEROBIC AND ANAEROBIC Blood Culture results may not be optimal due to an excessive volume of blood received in culture bottles Performed at [facility], [address] [place], [place] [address]    Culture   Final    NO GROWTH 5 DAYS Performed at [facility], [address], [place], [place] [address]    Report Status 11/06/2018 FINAL  Final  Blood culture (routine x 2)     Status: None   Collection Time: 10/31/18  8:34 PM  Result Value Ref Range Status   Specimen Description   Final    BLOOD LEFT HAND Performed at [facility], [address], [place], [place] [address]    Special Requests   Final    BOTTLES DRAWN AEROBIC AND ANAEROBIC Blood Culture results may not be optimal due to an excessive volume of blood received in culture bottles Performed at [facility], [address] [place], [place] [address]     Culture   Final    NO GROWTH 5 DAYS Performed at [facility], [address], [place], [place] [address]    Report Status 11/06/2018 FINAL  Final  Culture, Urine     Status: Abnormal   Collection Time: 11/02/18  4:27 PM  Result Value Ref Range Status   Specimen Description   Final    URINE, RANDOM Performed at [facility], [address] [place], [place] [address]    Special Requests   Final    NONE Performed at [facility], [address] [place], [place] [address]    Culture (A)  Final    <10,000 COLONIES/mL INSIGNIFICANT GROWTH Performed at [facility], [address] [place], [place] [address]    Report Status 11/03/2018 FINAL  Final  MRSA PCR Screening     Status: None   Collection Time: 11/02/18  5:49 PM  Result Value Ref Range Status   MRSA by PCR NEGATIVE NEGATIVE Final    Comment:        The GeneXpert MRSA Assay (FDA approved for NASAL specimens only), is one component of a comprehensive MRSA colonization surveillance program. It is not intended to diagnose MRSA infection nor to guide or monitor treatment for MRSA infections. Performed at [facility], [address] [place], [place] [address]   Culture, blood (routine x 2)     Status: None (Preliminary result)   Collection Time: 11/06/18  9:39 AM  Result Value Ref Range Status   Specimen Description   Final    BLOOD RIGHT HAND Performed at [facility], [address], [place], [place] [address]    Special Requests   Final  BAA BCAV Performed at [facility], [address], [place], [place] [address]    Culture   Final    NO GROWTH 2 DAYS Performed at [facility], [address], [place], [place] [address]    Report Status PENDING  Incomplete  Culture, blood (routine x 2)     Status: None (Preliminary result)   Collection Time: 11/06/18  9:54 AM  Result Value Ref Range Status    Specimen Description   Final    BLOOD RIGHT ARM Performed at [facility], [address] [place], [place] [address]    Special Requests   Final    AEB BCAV Performed at [facility], [address] [place], [place] [address]    Culture   Final    NO GROWTH 2 DAYS Performed at [facility], [address], [place], [place] [address]    Report Status PENDING  Incomplete     Labs: Basic Metabolic Panel: Recent Labs  Lab 11/04/18 0455 11/05/18 0427 11/06/18 0425 11/07/18 0508 11/08/18 0524  NA 143 144 143 144 143  K 3.7 3.3* 3.4* 3.2* 3.7  CL 113* 112* 113* 112* 109  CO2 24 22 24 23 23   GLUCOSE 123* 124* 131* 140* 143*  BUN 21* 20 24* 27* 32*  CREATININE 1.89* 1.84* 1.75* 1.57* 1.45*  CALCIUM 8.2* 8.2* 8.1* 8.0* 8.2*  MG 1.5* 1.6* 1.9 1.6* 1.9  PHOS 2.8 2.3* 2.5 2.9 2.7   Liver Function Tests: Recent Labs  Lab 11/03/18 0307 11/04/18 0455 11/05/18 0427 11/06/18 0425  AST 16 15 20  14*  ALT 16 16 19 17   ALKPHOS 40 43 57 53  BILITOT 1.0 1.3* 0.9 0.6  PROT 5.4* 5.5* 5.8* 5.8*  ALBUMIN 2.3* 2.2* 2.2* 2.0*   No results for input(s): LIPASE, AMYLASE in the last 168 hours. No results for input(s): AMMONIA in the last 168 hours. CBC: Recent Labs  Lab 11/03/18 0307 11/04/18 0455 11/06/18 0425 11/07/18 0508 11/08/18 0524  WBC 0.1* 0.1* 0.2* 0.4* 2.2*  NEUTROABS 0.1* 0.0*  --   --  1.1*  HGB 7.6* 7.3* 7.6* 7.4* 8.2*  HCT 23.3* 22.5* 22.9* 22.3* 25.0*  MCV 85.3 87.2 84.8 84.5 87.1  PLT 19* 8* 15* 67* 57*   Cardiac Enzymes: No results for input(s): CKTOTAL, CKMB, CKMBINDEX, TROPONINI in the last 168 hours. BNP: BNP (last 3 results) Recent Labs    11/29/17 0117  BNP 50.4    ProBNP (last 3 results) No results for input(s): PROBNP in the last 8760 hours.  CBG: Recent Labs  Lab 11/07/18 0632 11/07/18 1319 11/07/18 1731 11/07/18 2158 11/08/18 0537  GLUCAP 125* 130* 120* 123* 131*        Signed:  Jonette Mate N  [facility] 11/08/2018, 8:49 AM

## 2018-11-08 NOTE — Progress Notes (Signed)
Pt leaving AMA.  Paperwork signed/  IV team deaccessed PAC per order.  All belongings sent home with pt.  Education provided re: risks of leaving hospital.  Pt verbalized understanding.  Danton Clap, RN

## 2018-11-08 NOTE — Plan of Care (Signed)
  Problem: Health Behavior/Discharge Planning: Goal: Ability to manage health-related needs will improve Outcome: Progressing   Problem: Clinical Measurements: Goal: Ability to maintain clinical measurements within normal limits will improve Outcome: Progressing   Problem: Clinical Measurements: Goal: Will remain free from infection Outcome: Progressing   Problem: Clinical Measurements: Goal: Diagnostic test results will improve Outcome: Progressing   

## 2018-11-10 ENCOUNTER — Telehealth: Payer: Self-pay | Admitting: *Deleted

## 2018-11-10 NOTE — Telephone Encounter (Signed)
Received vm message from patient , requesting a call back.  TCT patient and spoke with him. He states he has a runny nose and a little bit of a cough. He wanted an antibiotic from Dr. Julien Nordmann. Pt states he does not have a fever or chills, secretions are clear. Patient has an appointment with Dr. Julien Nordmann on Monday. I informed him that we could not send in an antibiotic order without seeing him first. He states he will be fine  And will talk to Dr. Julien Nordmann about it on Monday.  Advised pt to increase fluid intake, Tylenol as needed and if he feels worse over the weekend to go to ED/Urgent care.  Pt voiced understanding.

## 2018-11-11 LAB — CULTURE, BLOOD (ROUTINE X 2)
Culture: NO GROWTH
Culture: NO GROWTH

## 2018-11-13 ENCOUNTER — Telehealth: Payer: Self-pay | Admitting: Medical Oncology

## 2018-11-13 ENCOUNTER — Inpatient Hospital Stay (HOSPITAL_BASED_OUTPATIENT_CLINIC_OR_DEPARTMENT_OTHER): Payer: Medicare Other | Admitting: Internal Medicine

## 2018-11-13 ENCOUNTER — Inpatient Hospital Stay: Payer: Medicare Other | Admitting: Nutrition

## 2018-11-13 ENCOUNTER — Inpatient Hospital Stay: Payer: Medicare Other

## 2018-11-13 ENCOUNTER — Encounter: Payer: Self-pay | Admitting: Internal Medicine

## 2018-11-13 ENCOUNTER — Encounter: Payer: Self-pay | Admitting: Nutrition

## 2018-11-13 ENCOUNTER — Telehealth: Payer: Self-pay | Admitting: Internal Medicine

## 2018-11-13 ENCOUNTER — Telehealth: Payer: Self-pay

## 2018-11-13 VITALS — BP 128/85 | HR 100 | Temp 98.8°F | Resp 18 | Ht 69.0 in | Wt 203.2 lb

## 2018-11-13 DIAGNOSIS — R110 Nausea: Secondary | ICD-10-CM | POA: Diagnosis not present

## 2018-11-13 DIAGNOSIS — R634 Abnormal weight loss: Secondary | ICD-10-CM | POA: Diagnosis not present

## 2018-11-13 DIAGNOSIS — R0609 Other forms of dyspnea: Secondary | ICD-10-CM | POA: Diagnosis not present

## 2018-11-13 DIAGNOSIS — R05 Cough: Secondary | ICD-10-CM | POA: Diagnosis not present

## 2018-11-13 DIAGNOSIS — C3411 Malignant neoplasm of upper lobe, right bronchus or lung: Secondary | ICD-10-CM

## 2018-11-13 DIAGNOSIS — R630 Anorexia: Secondary | ICD-10-CM

## 2018-11-13 DIAGNOSIS — E876 Hypokalemia: Secondary | ICD-10-CM | POA: Diagnosis not present

## 2018-11-13 DIAGNOSIS — Z5111 Encounter for antineoplastic chemotherapy: Secondary | ICD-10-CM | POA: Diagnosis not present

## 2018-11-13 DIAGNOSIS — R52 Pain, unspecified: Secondary | ICD-10-CM | POA: Diagnosis not present

## 2018-11-13 DIAGNOSIS — R5383 Other fatigue: Secondary | ICD-10-CM | POA: Diagnosis not present

## 2018-11-13 LAB — CMP (CANCER CENTER ONLY)
ALT: 35 U/L (ref 0–44)
AST: 18 U/L (ref 15–41)
Albumin: 2.6 g/dL — ABNORMAL LOW (ref 3.5–5.0)
Alkaline Phosphatase: 117 U/L (ref 38–126)
Anion gap: 15 (ref 5–15)
BUN: 14 mg/dL (ref 6–20)
CHLORIDE: 106 mmol/L (ref 98–111)
CO2: 23 mmol/L (ref 22–32)
Calcium: 8.8 mg/dL — ABNORMAL LOW (ref 8.9–10.3)
Creatinine: 1.15 mg/dL (ref 0.61–1.24)
GFR, Estimated: >60 mL/min (ref >60)
Glucose, Bld: 71 mg/dL (ref 70–99)
Potassium: 3.1 mmol/L — ABNORMAL LOW (ref 3.5–5.1)
Sodium: 144 mmol/L (ref 135–145)
Total Bilirubin: 0.5 mg/dL (ref 0.3–1.2)
Total Protein: 6.8 g/dL (ref 6.5–8.1)

## 2018-11-13 LAB — CBC WITH DIFFERENTIAL (CANCER CENTER ONLY)
Abs Immature Granulocytes: 0.56 10*3/uL — ABNORMAL HIGH (ref 0.00–0.07)
BASOS ABS: 0 10*3/uL (ref 0.0–0.1)
Basophils Relative: 1 %
Eosinophils Absolute: 0 10*3/uL (ref 0.0–0.5)
Eosinophils Relative: 0 %
HCT: 31 % — ABNORMAL LOW (ref 39.0–52.0)
Hemoglobin: 10.1 g/dL — ABNORMAL LOW (ref 13.0–17.0)
Immature Granulocytes: 7 %
Lymphocytes Relative: 5 %
Lymphs Abs: 0.4 10*3/uL — ABNORMAL LOW (ref 0.7–4.0)
MCH: 27.7 pg (ref 26.0–34.0)
MCHC: 32.6 g/dL (ref 30.0–36.0)
MCV: 85.2 fL (ref 80.0–100.0)
Monocytes Absolute: 1.6 10*3/uL — ABNORMAL HIGH (ref 0.1–1.0)
Monocytes Relative: 19 %
NRBC: 0 % (ref 0.0–0.2)
Neutro Abs: 5.8 10*3/uL (ref 1.7–7.7)
Neutrophils Relative %: 68 %
Platelet Count: 169 10*3/uL (ref 150–400)
RBC: 3.64 MIL/uL — ABNORMAL LOW (ref 4.22–5.81)
RDW: 14.4 % (ref 11.5–15.5)
WBC Count: 8.3 10*3/uL (ref 4.0–10.5)

## 2018-11-13 LAB — MAGNESIUM: MAGNESIUM: 1.5 mg/dL — AB (ref 1.7–2.4)

## 2018-11-13 MED ORDER — METHYLPREDNISOLONE 4 MG PO TBPK: ORAL_TABLET | ORAL | 0 refills | Status: DC

## 2018-11-13 MED ORDER — HYDROCODONE-ACETAMINOPHEN 5-325 MG PO TABS: 1.0000 | ORAL_TABLET | Freq: Four times a day (QID) | ORAL | 0 refills | Status: DC | PRN

## 2018-11-13 MED ORDER — POTASSIUM CHLORIDE 2 MEQ/ML IV SOLN
Freq: Once | INTRAVENOUS | Status: DC
  Filled 2018-11-13: qty 10

## 2018-11-13 MED ORDER — POTASSIUM CHLORIDE ER 20 MEQ PO TBCR: 10.0000 meq | EXTENDED_RELEASE_TABLET | Freq: Every day | ORAL | 0 refills | Status: DC

## 2018-11-13 MED ORDER — SODIUM CHLORIDE 0.9 % IV SOLN
Freq: Once | INTRAVENOUS | Status: DC
  Filled 2018-11-13: qty 250

## 2018-11-13 MED FILL — HYDROCODON-APAP 5-325: 5-325 | 30 days supply | Qty: 120 | Fill #0

## 2018-11-13 MED FILL — METHYLPREDNISOLONE 4 MG TAB: 4 | 6 days supply | Qty: 21 | Fill #0

## 2018-11-13 MED FILL — POTASSIUM CHLORIDE CRYS ER: 20 | 5 days supply | Qty: 10 | Fill #0

## 2018-11-13 NOTE — Telephone Encounter (Signed)
l told wife we are looking for pt in tx-she will try to locate him.

## 2018-11-13 NOTE — Progress Notes (Signed)
[place] [phone]   [phone]  OFFICE PROGRESS NOTE  [name], MD [place] [address]  DIAGNOSIS: Limited stage (T3, N3, M0) small cell lung cancer presented with right paratracheal mass in addition to right suprahilar mass and lymphadenopathy as well as right cervical lymph node diagnosed in October 2019  PRIOR THERAPY: None  CURRENT THERAPY: systemic chemotherapy with cisplatin 80 mg/M2 on day 1 and etoposide 100 mg/M2 on days 1, 2 and 3 every 3 weeks.  Status post 3 cycles.  This will be concurrent with radiotherapy with the start of cycle #2.  Starting from cycle #4 his dose of cisplatin would be 60 mg/M2 and etoposide 9 mg/M2  INTERVAL HISTORY: DEAVION DOBBS 52 y.o. male returns to the clinic today for follow-up visit accompanied by his wife.  The patient is feeling fine today with no concerning complaints except for fatigue.  He was admitted after cycle #3 2 [facility] with pancytopenia.  He is feeling much better today.  He denied having any chest pain, shortness of breath, cough or hemoptysis.  He has no nausea, vomiting, diarrhea or constipation.  He has no headache or visual changes.  He is here for evaluation before starting cycle #4.  MEDICAL HISTORY: Past Medical History:  Diagnosis Date  . Anxiety   . CAD (coronary artery disease)    a. s/p multiple PCIs with last cath 11/2016 with severe multivessel CAD, s/p PCTA to LCx but unable to pass stent  . Chronic leg pain    bilateral  . Chronic lower back pain   . COPD (chronic obstructive pulmonary disease) ([place])   . Depression   . GERD (gastroesophageal reflux disease)    Takes Dexilant  . HLD (hyperlipidemia)   . Hypertension   . Rhabdomyolysis    h/o, r/t statins  . Sleep apnea    "can't tolerate mask" (12/16/2016)  . Type II diabetes mellitus (H[facility])     ALLERGIES:  is allergic to iohexol.  MEDICATIONS:  Current Outpatient  Medications  Medication Sig Dispense Refill  . albuterol (PROAIR HFA) 108 (90 Base) MCG/ACT inhaler Inhale 1 puff into the lungs every 6 (six) hours as needed for wheezing or shortness of breath. 1 Inhaler 2  . amLODipine (NORVASC) 10 MG tablet Take 1 tablet (10 mg total) by mouth daily. 90 tablet 1  . aspirin 81 MG tablet Take 1 tablet (81 mg total) daily by mouth. 30 tablet   . baclofen (LIORESAL) 10 MG tablet Take 1 tablet (10 mg total) by mouth 3 (three) times daily as needed for muscle spasms.    . benzonatate (TESSALON) 100 MG capsule Take 1 capsule (100 mg total) by mouth every 8 (eight) hours. (Patient taking differently: Take 100 mg by mouth 3 (three) times daily as needed for cough. ) 21 capsule 0  . Blood Glucose Monitoring Suppl (ACCU-CHEK AVIVA PLUS) w/Device KIT Use as dircted 1 kit 0  . Cholecalciferol (VITAMIN D) 2000 units tablet Take 1 tablet (2,000 Units total) by mouth daily. 30 tablet 1  . clopidogrel (PLAVIX) 75 MG tablet Take 1 tablet (75 mg total) by mouth daily. YOU MAY RESTART 08/26/2018 90 tablet 1  . diclofenac sodium (VOLTAREN) 1 % GEL Apply 2 g topically 4 (four) times daily. (Patient taking differently: Apply 2 g topically 4 (four) times daily as needed (pain). ) 1 Tube 0  . dronabinol (MARINOL) 5 MG capsule Take 1  capsule (5 mg total) by mouth 2 (two) times daily before a meal. 60 capsule 0  . FLUoxetine (PROZAC) 40 MG capsule Take 1 capsule (40 mg total) by mouth daily. 90 capsule 1  . fluticasone (FLONASE) 50 MCG/ACT nasal spray Place 2 sprays into both nostrils daily as needed for allergies.    . Fluticasone-Salmeterol (ADVAIR) 100-50 MCG/DOSE AEPB Inhale 1 puff into the lungs 2 (two) times daily. 1 each 3  . glucose blood (ACCU-CHEK AVIVA) test strip Use as instructed 100 each 12  . hydrALAZINE (APRESOLINE) 25 MG tablet Take 1 tablet (25 mg total) by mouth 2 (two) times daily. 180 tablet 1  . HYDROcodone-acetaminophen (NORCO) 5-325 MG tablet Take 1-2 tablets by  mouth every 6 (six) hours as needed for moderate pain. 120 tablet 0  . hydrocortisone-pramoxine (ANALPRAM-HC) 2.5-1 % rectal cream Place 1 application rectally as needed. (Patient taking differently: Place 1 application rectally as needed for hemorrhoids or anal itching. )    . isosorbide mononitrate (IMDUR) 120 MG 24 hr tablet Take 1 tablet (120 mg total) by mouth daily. 90 tablet 1  . lidocaine-prilocaine (EMLA) cream Apply 1 application topically as needed. Apply 1 tsp on skin over port site one hour prior to chemotherapy. Cover with plastic wrap. Do not rub in medication. 30 g 0  . LORazepam (ATIVAN) 0.5 MG tablet 1 tab po q 4-6 hours prn or 1 tab po 30 minutes prior to radiation 30 tablet 0  . mirtazapine (REMERON) 30 MG tablet Take 1 tablet (30 mg total) by mouth at bedtime. 30 tablet 2  . Nebivolol HCl 20 MG TABS Take 1 tablet (20 mg total) by mouth daily. 90 tablet 3  . nicotine (NICODERM CQ) 21 mg/24hr patch Place 1 patch (21 mg total) onto the skin daily. 28 patch 0  . ondansetron (ZOFRAN) 8 MG tablet Take 1 tablet (8 mg total) by mouth every 8 (eight) hours as needed for nausea or vomiting. 20 tablet 0  . pantoprazole (PROTONIX) 40 MG tablet Take 1 tablet (40 mg total) by mouth daily.    . prazosin (MINIPRESS) 1 MG capsule Take 1 capsule (1 mg total) by mouth at bedtime. For nightmares 30 capsule 2  . prochlorperazine (COMPAZINE) 10 MG tablet Take 1 tablet (10 mg total) by mouth every 6 (six) hours as needed for nausea or vomiting. 30 tablet 0  . promethazine (PHENERGAN) 25 MG tablet Take 1 tablet (25 mg total) by mouth every 6 (six) hours as needed for nausea or vomiting. 10 tablet 0  . ranolazine (RANEXA) 1000 MG SR tablet Take 1 tablet (1,000 mg total) by mouth 2 (two) times daily. 180 tablet 3  . rosuvastatin (CRESTOR) 20 MG tablet Take 1 tablet (20 mg total) by mouth daily. 90 tablet 1  . sucralfate (CARAFATE) 1 g tablet Take 1 tablet by mouth 4 (four) times daily -  before meals and  at bedtime.  0  . tiotropium (SPIRIVA HANDIHALER) 18 MCG inhalation capsule Place 1 capsule (18 mcg total) into inhaler and inhale daily. 90 capsule 1  . vitamin B-12 (CYANOCOBALAMIN) 500 MCG tablet Take 1 tablet (500 mcg total) by mouth daily. 30 tablet 1  . zolpidem (AMBIEN) 5 MG tablet Take 1 tablet (5 mg total) by mouth at bedtime as needed for sleep. 30 tablet 2  . nitroGLYCERIN (NITROSTAT) 0.4 MG SL tablet Place 1 tablet (0.4 mg total) under the tongue every 5 (five) minutes as needed for chest pain. (Patient not taking:  Reported on 11/13/2018) 25 tablet 2   No current facility-administered medications for this visit.     SURGICAL HISTORY:  Past Surgical History:  Procedure Laterality Date  . BACK SURGERY    . BRONCHIAL BIOPSY  08/25/2018   Procedure: BRONCHIAL BIOPSIES;  Surgeon: Garner Nash, DO;  Location: [facility];  Service: Cardiopulmonary;;  . CARDIAC CATHETERIZATION N/A 09/25/2015   Procedure: Left Heart Cath and Coronary Angiography;  Surgeon: Leonie Man, MD;  Location: [place] CV [facility];  Service: Cardiovascular;  Laterality: N/A;  . CARDIAC CATHETERIZATION N/A 12/16/2016   Procedure: Left Heart Cath and Coronary Angiography;  Surgeon: Leonie Man, MD;  Location: [place] CV LAB;  Service: Cardiovascular;  Laterality: N/A;  . CARDIAC CATHETERIZATION N/A 12/16/2016   Procedure: Coronary Balloon Angioplasty;  Surgeon: Leonie Man, MD;  Location: [place] CV LAB;  Service: Cardiovascular;  Laterality: N/A;  . COLONOSCOPY W/ POLYPECTOMY    . CORONARY ANGIOPLASTY  09/25/2015   mid cir & om  . CORONARY ANGIOPLASTY WITH STENT PLACEMENT  10/09/2001   PTCA & stenting of mid AV circumflex; 2.5x75m Pixel stent  . CORONARY ANGIOPLASTY WITH STENT PLACEMENT  12/13/2001   PCI with stent to mid L circumflex, 95% stenosis to 0% residual  . CORONARY ANGIOPLASTY WITH STENT PLACEMENT  10/10/2003   PCI to mid AV circumflex; LAD 30% disease; RCA 100% occluded prox.  .  CORONARY ANGIOPLASTY WITH STENT PLACEMENT  09/01/2011   PCI with stenting with bare metal stent to mid AV groove circumflex and PDA  . CORONARY ANGIOPLASTY WITH STENT PLACEMENT  10/17/2011   cutting balloon angioplasty of ostial lateral OM1 branch and bifurcation AV groove circumflex OM junction; stenosis reduced to 0%  . ENDOBRONCHIAL ULTRASOUND Bilateral 08/25/2018   Procedure: ENDOBRONCHIAL ULTRASOUND;  Surgeon: IGarner Nash DO;  Location: [facility];  Service: Cardiopulmonary;  Laterality: Bilateral;  . EXCISIONAL HEMORRHOIDECTOMY    . FINE NEEDLE ASPIRATION  08/25/2018   Procedure: FINE NEEDLE ASPIRATION;  Surgeon: IGarner Nash DO;  Location: [facility];  Service: Cardiopulmonary;;  . FLEXIBLE BRONCHOSCOPY  08/25/2018   Procedure: FLEXIBLE BRONCHOSCOPY;  Surgeon: IGarner Nash DO;  Location: [facility];  Service: Cardiopulmonary;;  . IR IMAGING GUIDED PORT INSERTION  09/29/2018  . LEFT HEART CATHETERIZATION WITH CORONARY ANGIOGRAM N/A 10/18/2011   Procedure: LEFT HEART CATHETERIZATION WITH CORONARY ANGIOGRAM;  Surgeon: DLeonie Man MD;  [facility];  Service: Cardiovascular;  Laterality: N/A;  . LUMBAR LAMINECTOMY/DECOMPRESSION MICRODISCECTOMY  03/31/2012   Procedure: LUMBAR LAMINECTOMY/DECOMPRESSION MICRODISCECTOMY 1 LEVEL;  Surgeon: HCharlie Pitter MD;  [facility];  Service: Neurosurgery;  Laterality: Left;  . TRANSTHORACIC ECHOCARDIOGRAM  07/28/2011   EF 55-65%; LVH, grade 1 diastolic dysfunction;     REVIEW OF SYSTEMS:  A comprehensive review of systems was negative except for: Constitutional: positive for fatigue and weight loss   PHYSICAL EXAMINATION: General appearance: alert, cooperative, fatigued and no distress Head: Normocephalic, without obvious abnormality, atraumatic Neck: no adenopathy, no JVD, supple, symmetrical, trachea midline and thyroid not enlarged, symmetric, no tenderness/mass/nodules Lymph nodes: Cervical, supraclavicular,  and axillary nodes normal. Resp: clear to auscultation bilaterally Back: symmetric, no curvature. ROM normal. No CVA tenderness. Cardio: regular rate and rhythm, S1, S2 normal, no murmur, click, rub or gallop GI: soft, non-tender; bowel sounds normal; no masses,  no organomegaly Extremities: extremities normal, atraumatic, no cyanosis or edema  ECOG PERFORMANCE STATUS: 1 - Symptomatic but completely ambulatory  Blood pressure 128/85,  pulse 100, temperature 98.8 F (37.1 C), temperature source Oral, resp. rate 18, height _0  (1.753 m), weight 203 lb 3.2 oz (92.2 kg), SpO2 100 %.  LABORATORY DATA: Lab Results  Component Value Date   WBC 8.3 11/13/2018   HGB 10.1 (L) 11/13/2018   HCT 31.0 (L) 11/13/2018   MCV 85.2 11/13/2018   PLT 169 11/13/2018      Chemistry      Component Value Date/Time   NA 144 11/13/2018 0828   NA 144 07/25/2018 0949   K 3.1 (L) 11/13/2018 0828   CL 106 11/13/2018 0828   CO2 23 11/13/2018 0828   BUN 14 11/13/2018 0828   BUN 10 07/25/2018 0949   CREATININE 1.15 11/13/2018 0828   CREATININE 0.96 12/13/2016 1151      Component Value Date/Time   CALCIUM 8.8 (L) 11/13/2018 0828   ALKPHOS 117 11/13/2018 0828   AST 18 11/13/2018 0828   ALT 35 11/13/2018 0828   BILITOT 0.5 11/13/2018 0828       RADIOGRAPHIC STUDIES: Ct Abdomen Pelvis Wo Contrast  Result Date: 10/31/2018 CLINICAL DATA:  52 year old male with abdominal pain. Concern for acute appendicitis. History of right lung mass. EXAM: CT ABDOMEN AND PELVIS WITHOUT CONTRAST TECHNIQUE: Multidetector CT imaging of the abdomen and pelvis was performed following the standard protocol without IV contrast. COMPARISON:  PET-CT dated 08/21/2018 FINDINGS: Evaluation of this exam is limited in the absence of intravenous contrast. Lower chest: Bibasilar subpleural interstitial coarsening/scarring or atelectasis. Developing infiltrate at the right lung base is less likely but not excluded. Coronary vascular  calcification. There is hypoattenuation of the cardiac blood pool suggestive of a degree of anemia. Clinical correlation is recommended. No intra-abdominal free air or free fluid. Hepatobiliary: No focal liver abnormality is seen. No gallstones, gallbladder wall thickening, or biliary dilatation. Pancreas: Unremarkable. No pancreatic ductal dilatation or surrounding inflammatory changes. Spleen: Normal in size without focal abnormality. Adrenals/Urinary Tract: The adrenal glands are unremarkable. There is no hydronephrosis or nephrolithiasis on either side. A 9 mm exophytic hypodense lesion from the lateral inferior pole of the right kidney is not well characterized but may represent a cyst. The visualized ureters and urinary bladder appear unremarkable. Stomach/Bowel: There is no bowel obstruction or active inflammation. Normal appendix. Vascular/Lymphatic: Moderate aortoiliac atherosclerotic disease. No portal venous gas. There is no adenopathy. Reproductive: The prostate and seminal vesicles are grossly unremarkable. No pelvic mass. Other: Small fat containing umbilical hernia. Musculoskeletal: No acute or significant osseous findings. IMPRESSION: No acute intra-abdominal or pelvic pathology. No bowel obstruction or active inflammation. Normal appendix. Electronically Signed   By: Anner Crete M.D.   On: 10/31/2018 23:21   Dg Chest 2 View  Result Date: 10/22/2018 CLINICAL DATA:  52 year old male with cough. EXAM: CHEST - 2 VIEW COMPARISON:  Chest CT dated 10/20/2018 FINDINGS: Left pectoral Port-A-Cath with tip in the region of the cavoatrial junction. The lungs are clear. The nodule seen in the right lung base on the prior CT is not visualized this radiograph. There is no pleural effusion or pneumothorax. The cardiac silhouette is within normal limits. Coronary vascular calcification noted. No acute osseous pathology. IMPRESSION: No active cardiopulmonary disease. Electronically Signed   By: Anner Crete M.D.   On: 10/22/2018 21:36   Ct Chest Wo Contrast  Result Date: 10/20/2018 CLINICAL DATA:  Small cell lung cancer, diagnosed September 2019, chemotherapy/XRT ongoing EXAM: CT CHEST WITHOUT CONTRAST TECHNIQUE: Multidetector CT imaging of the chest was performed following the standard  protocol without IV contrast. COMPARISON:  PET-CT dated 08/21/2018.  CT chest dated 07/31/2018. FINDINGS: Cardiovascular: Heart is normal in size.  No pericardial effusion. No evidence of thoracic aortic aneurysm. Atherosclerotic calcifications of the aortic arch. Three vessel coronary atherosclerosis. Mediastinum/Nodes: Mediastinal lymphadenopathy, improved, including: --1.3 cm short axis right paratracheal node (series 2/image 84), previously 3.3 cm --1.0 cm short axis AP window node (series 2/image 60), previously 1.4 cm --1.5 cm short axis subcarinal node (series 2/image 32), previously 1.9 cm Bilateral hilar regions are poorly evaluated in the absence of intravenous contrast. Visualized thyroid is unremarkable. Lungs/Pleura: Mild centrilobular and paraseptal emphysematous changes, upper lobe predominant. Very mild ground-glass peribronchovascular nodularity in the right upper lobe (series 5/image 85), nonspecific. 2.3 x 1.2 cm thick-walled cavitary subpleural nodule in the lateral right lung base (series 5/image 115), new. No focal consolidation. No pleural effusion or pneumothorax. Upper Abdomen: Visualized upper abdomen is unremarkable. Musculoskeletal: Mild degenerative changes of the visualized thoracolumbar spine. IMPRESSION: Improving mediastinal lymphadenopathy, as above. Bilateral hilar regions are poorly evaluated in the absence of intravenous contrast. 2.3 x 1.2 cm thick-walled cavitary nodule in the lateral right lung base, new. This may be secondary to radiation change. Attention on follow-up is suggested. Aortic Atherosclerosis (ICD10-I70.0) and Emphysema (ICD10-J43.9). Electronically Signed   By:  Julian Hy M.D.   On: 10/20/2018 09:28   Dg Chest Port 1 View  Result Date: 11/02/2018 CLINICAL DATA:  52 year old current history of small-cell lung cancer for which the patient is being treated with chemotherapy and radiation therapy, presenting with acute onset of fever. EXAM: PORTABLE CHEST 1 VIEW COMPARISON:  11/02/2018 and earlier, including CT chest 10/20/2018 and PET-CT 08/21/2018. FINDINGS: Cardiac silhouette normal in size for AP technique. Thoracic aorta mildly tortuous and atherosclerotic, unchanged. RIGHT paratracheal mediastinal lymphadenopathy identified on prior examinations has resolved. LEFT jugular Port-A-Cath tip projects over the mid SVC, unchanged. Patchy airspace opacities at the RIGHT lung base. Lungs otherwise clear. Pulmonary vascularity normal. IMPRESSION: Acute pneumonia involving the RIGHT lung base. Electronically Signed   By: Evangeline Dakin M.D.   On: 11/02/2018 08:09   Dg Chest Port 1 View  Result Date: 10/31/2018 CLINICAL DATA:  History of lung cancer, chills EXAM: PORTABLE CHEST 1 VIEW COMPARISON:  10/22/2018, 10/08/2018, CT chest 10/20/2018 FINDINGS: Left-sided central venous port tip over the SVC. No acute consolidation or effusion. Stable cardiomediastinal silhouette. No pneumothorax. IMPRESSION: No active disease. Electronically Signed   By: Donavan Foil M.D.   On: 10/31/2018 21:09    ASSESSMENT AND PLAN: This is a very pleasant 52 years old African-American male recently diagnosed with limited stage small cell lung cancer and currently undergoing systemic chemotherapy with cisplatin and etoposide status post 3 cycles.  This is concurrent with radiotherapy. Has been tolerating the treatment well except for pancytopenia requiring Granix injection for the neutropenia.  He is feeling much better today. I recommended for him to proceed with cycle #4 today as scheduled. For the lack of appetite and weight loss, I started the patient on Medrol Dosepak. For  the hypokalemia I will call his pharmacy with prescription for potassium chloride. For pain management he was given refill of Vicodin. I will see the patient back for follow-up visit in 3 weeks for evaluation with repeat CT scan of the chest for restaging of his disease. The patient voices understanding of current disease status and treatment options and is in agreement with the current care plan. All questions were answered. The patient knows to call the clinic  with any problems, questions or concerns. We can certainly see the patient much sooner if necessary.  Disclaimer: This note was dictated with voice recognition software. Similar sounding words can inadvertently be transcribed and may not be corrected upon review.       

## 2018-11-13 NOTE — Telephone Encounter (Signed)
appts already scheduled until the end of treatment plan  Per 12/23 los.

## 2018-11-13 NOTE — Progress Notes (Signed)
It is 1130 now - since 0950 we have been looking for this patient. We have been in contact with Dr. Julien Nordmann, his nurse Diane and we have been looking for him all over our facility and various departments. Diane even spoke with his wife and we have not found him, we assume he left the building. He has not answered any of our phone calls to his cell phone. His IVF were returned to the pharmacy now, we gave him ample time to appear to infusion but now it is too late to begin his treatment even if he did show up. MD Julien Nordmann was made aware again.

## 2018-11-13 NOTE — Telephone Encounter (Signed)
Printed avs and calender of upcoming appointment. Per 12/23 los 

## 2018-11-13 NOTE — Progress Notes (Signed)
Patient did not go to chemotherapy appointment today. RN reports they looked for him but he had left. No nutrition follow up completed.

## 2018-11-14 ENCOUNTER — Telehealth: Payer: Self-pay

## 2018-11-14 ENCOUNTER — Inpatient Hospital Stay: Payer: Medicare Other

## 2018-11-14 NOTE — Progress Notes (Deleted)
Called this patient's cell phone at [facility] to find out if he was in the building for his 0800 appointment. Patient reports being sick all night with diarrhea and vomiting and so would not be showing for treatment and requested that his treatment be rescheduled. Charge nurse notified.

## 2018-11-16 ENCOUNTER — Inpatient Hospital Stay: Payer: Medicare Other

## 2018-11-16 ENCOUNTER — Telehealth: Payer: Self-pay | Admitting: Medical Oncology

## 2018-11-16 NOTE — Telephone Encounter (Signed)
Pt cancelled infusion appt for 8//26/19.

## 2018-11-17 ENCOUNTER — Telehealth: Payer: Self-pay | Admitting: Internal Medicine

## 2018-11-17 NOTE — Telephone Encounter (Signed)
R/s apt per 12/24 sch message - called patient at home , mobile and called spouse - no answer - left message on spouses phone with appt date and time

## 2018-11-20 ENCOUNTER — Other Ambulatory Visit: Payer: Medicare Other

## 2018-11-21 ENCOUNTER — Inpatient Hospital Stay: Payer: Medicare Other

## 2018-11-21 ENCOUNTER — Other Ambulatory Visit: Payer: Self-pay | Admitting: *Deleted

## 2018-11-21 DIAGNOSIS — E876 Hypokalemia: Secondary | ICD-10-CM | POA: Diagnosis not present

## 2018-11-21 DIAGNOSIS — Z5111 Encounter for antineoplastic chemotherapy: Secondary | ICD-10-CM | POA: Diagnosis not present

## 2018-11-21 DIAGNOSIS — C3411 Malignant neoplasm of upper lobe, right bronchus or lung: Secondary | ICD-10-CM

## 2018-11-21 DIAGNOSIS — R110 Nausea: Secondary | ICD-10-CM | POA: Diagnosis not present

## 2018-11-21 DIAGNOSIS — R05 Cough: Secondary | ICD-10-CM | POA: Diagnosis not present

## 2018-11-21 DIAGNOSIS — R0609 Other forms of dyspnea: Secondary | ICD-10-CM | POA: Diagnosis not present

## 2018-11-21 LAB — CBC WITH DIFFERENTIAL (CANCER CENTER ONLY)
Abs Immature Granulocytes: 0.03 10*3/uL (ref 0.00–0.07)
Basophils Absolute: 0 10*3/uL (ref 0.0–0.1)
Basophils Relative: 0 %
Eosinophils Absolute: 0 10*3/uL (ref 0.0–0.5)
Eosinophils Relative: 0 %
HEMATOCRIT: 30.6 % — AB (ref 39.0–52.0)
Hemoglobin: 9.9 g/dL — ABNORMAL LOW (ref 13.0–17.0)
Immature Granulocytes: 0 %
Lymphocytes Relative: 2 %
Lymphs Abs: 0.2 10*3/uL — ABNORMAL LOW (ref 0.7–4.0)
MCH: 28 pg (ref 26.0–34.0)
MCHC: 32.4 g/dL (ref 30.0–36.0)
MCV: 86.4 fL (ref 80.0–100.0)
Monocytes Absolute: 1 10*3/uL (ref 0.1–1.0)
Monocytes Relative: 13 %
Neutro Abs: 6.4 10*3/uL (ref 1.7–7.7)
Neutrophils Relative %: 85 %
Platelet Count: 251 10*3/uL (ref 150–400)
RBC: 3.54 MIL/uL — ABNORMAL LOW (ref 4.22–5.81)
RDW: 15.5 % (ref 11.5–15.5)
WBC Count: 7.6 10*3/uL (ref 4.0–10.5)
nRBC: 0 % (ref 0.0–0.2)

## 2018-11-21 LAB — CMP (CANCER CENTER ONLY)
ALT: 13 U/L (ref 0–44)
AST: 12 U/L — ABNORMAL LOW (ref 15–41)
Albumin: 3 g/dL — ABNORMAL LOW (ref 3.5–5.0)
Alkaline Phosphatase: 86 U/L (ref 38–126)
Anion gap: 12 (ref 5–15)
BILIRUBIN TOTAL: 0.4 mg/dL (ref 0.3–1.2)
BUN: 11 mg/dL (ref 6–20)
CO2: 25 mmol/L (ref 22–32)
Calcium: 9.3 mg/dL (ref 8.9–10.3)
Chloride: 106 mmol/L (ref 98–111)
Creatinine: 1.42 mg/dL — ABNORMAL HIGH (ref 0.61–1.24)
GFR, EST NON AFRICAN AMERICAN: 56 mL/min — AB (ref >60)
GFR, Est AFR Am: >60 mL/min (ref >60)
Glucose, Bld: 103 mg/dL — ABNORMAL HIGH (ref 70–99)
Potassium: 3.6 mmol/L (ref 3.5–5.1)
Sodium: 143 mmol/L (ref 135–145)
TOTAL PROTEIN: 7 g/dL (ref 6.5–8.1)

## 2018-11-21 LAB — MAGNESIUM: Magnesium: 1.4 mg/dL — CL (ref 1.7–2.4)

## 2018-11-23 ENCOUNTER — Inpatient Hospital Stay: Payer: Medicare Other | Attending: Internal Medicine

## 2018-11-23 ENCOUNTER — Other Ambulatory Visit: Payer: Self-pay | Admitting: Internal Medicine

## 2018-11-23 VITALS — BP 109/82 | HR 99 | Temp 99.0°F | Resp 18

## 2018-11-23 DIAGNOSIS — C3411 Malignant neoplasm of upper lobe, right bronchus or lung: Secondary | ICD-10-CM

## 2018-11-23 DIAGNOSIS — Z5111 Encounter for antineoplastic chemotherapy: Secondary | ICD-10-CM | POA: Insufficient documentation

## 2018-11-23 DIAGNOSIS — E876 Hypokalemia: Secondary | ICD-10-CM | POA: Diagnosis not present

## 2018-11-23 MED ORDER — SODIUM CHLORIDE 0.9 % IV SOLN
Freq: Once | INTRAVENOUS | Status: AC
  Administered 2018-11-23: 09:00:00 via INTRAVENOUS
  Filled 2018-11-23: qty 250

## 2018-11-23 MED ORDER — SODIUM CHLORIDE 0.9 % IV SOLN
60.0000 mg/m2 | Freq: Once | INTRAVENOUS | Status: AC
  Administered 2018-11-23: 124 mg via INTRAVENOUS
  Filled 2018-11-23: qty 124

## 2018-11-23 MED ORDER — HEPARIN SOD (PORK) LOCK FLUSH 100 UNIT/ML IV SOLN
500.0000 [IU] | Freq: Once | INTRAVENOUS | Status: AC | PRN
  Administered 2018-11-23: 500 [IU]
  Filled 2018-11-23: qty 5

## 2018-11-23 MED ORDER — SODIUM CHLORIDE 0.9% FLUSH
10.0000 mL | INTRAVENOUS | Status: DC | PRN
  Administered 2018-11-23: 10 mL
  Filled 2018-11-23: qty 10

## 2018-11-23 MED ORDER — PALONOSETRON HCL INJECTION 0.25 MG/5ML
INTRAVENOUS | Status: AC
  Filled 2018-11-23: qty 5

## 2018-11-23 MED ORDER — PALONOSETRON HCL INJECTION 0.25 MG/5ML
0.2500 mg | Freq: Once | INTRAVENOUS | Status: AC
  Administered 2018-11-23: 0.25 mg via INTRAVENOUS

## 2018-11-23 MED ORDER — SODIUM CHLORIDE 0.9 % IV SOLN
90.0000 mg/m2 | Freq: Once | INTRAVENOUS | Status: AC
  Administered 2018-11-23: 190 mg via INTRAVENOUS
  Filled 2018-11-23: qty 9.5

## 2018-11-23 MED ORDER — SODIUM CHLORIDE 0.9 % IV SOLN
Freq: Once | INTRAVENOUS | Status: AC
  Administered 2018-11-23: 11:00:00 via INTRAVENOUS
  Filled 2018-11-23: qty 5

## 2018-11-23 MED ORDER — POTASSIUM CHLORIDE 2 MEQ/ML IV SOLN
Freq: Once | INTRAVENOUS | Status: AC
  Administered 2018-11-23: 09:00:00 via INTRAVENOUS
  Filled 2018-11-23: qty 10

## 2018-11-23 NOTE — Progress Notes (Signed)
Per Dr. Julien Nordmann, okay to treat with mag of 1.4.  Orders received to add 2 grams of mag in cisplat fluids.  Pharmacy notified.   Per Dr. Julien Nordmann, okay to treat with urine output of 100 mLs post first 2 hours cisplatin fluids.

## 2018-11-23 NOTE — Patient Instructions (Signed)
[facility] Discharge Instructions for Patients Receiving Chemotherapy  Today you received the following chemotherapy agents Cisplatin and etoposide  To help prevent nausea and vomiting after your treatment, we encourage you to take your nausea medication as directed If you develop nausea and vomiting that is not controlled by your nausea medication, call the clinic.   BELOW ARE SYMPTOMS THAT SHOULD BE REPORTED IMMEDIATELY:  *FEVER GREATER THAN 100.5 F  *CHILLS WITH OR WITHOUT FEVER  NAUSEA AND VOMITING THAT IS NOT CONTROLLED WITH YOUR NAUSEA MEDICATION  *UNUSUAL SHORTNESS OF BREATH  *UNUSUAL BRUISING OR BLEEDING  TENDERNESS IN MOUTH AND THROAT WITH OR WITHOUT PRESENCE OF ULCERS  *URINARY PROBLEMS  *BOWEL PROBLEMS  UNUSUAL RASH Items with * indicate a potential emergency and should be followed up as soon as possible.  Feel free to call the clinic should you have any questions or concerns. The clinic phone number is [phone].  Please show the [facility] at check-in to the Emergency Department and triage nurse.

## 2018-11-24 ENCOUNTER — Other Ambulatory Visit: Payer: Self-pay | Admitting: Medical Oncology

## 2018-11-24 ENCOUNTER — Inpatient Hospital Stay: Payer: Medicare Other

## 2018-11-24 VITALS — BP 96/73 | HR 91 | Temp 97.8°F | Resp 18

## 2018-11-24 DIAGNOSIS — Z95828 Presence of other vascular implants and grafts: Secondary | ICD-10-CM

## 2018-11-24 DIAGNOSIS — C3411 Malignant neoplasm of upper lobe, right bronchus or lung: Secondary | ICD-10-CM | POA: Diagnosis not present

## 2018-11-24 DIAGNOSIS — E876 Hypokalemia: Secondary | ICD-10-CM | POA: Diagnosis not present

## 2018-11-24 DIAGNOSIS — Z5111 Encounter for antineoplastic chemotherapy: Secondary | ICD-10-CM | POA: Diagnosis not present

## 2018-11-24 MED ORDER — HEPARIN SOD (PORK) LOCK FLUSH 100 UNIT/ML IV SOLN
500.0000 [IU] | Freq: Once | INTRAVENOUS | Status: AC | PRN
  Administered 2018-11-24: 500 [IU]
  Filled 2018-11-24: qty 5

## 2018-11-24 MED ORDER — DEXAMETHASONE SODIUM PHOSPHATE 10 MG/ML IJ SOLN
INTRAMUSCULAR | Status: AC
  Filled 2018-11-24: qty 1

## 2018-11-24 MED ORDER — LIDOCAINE-PRILOCAINE 2.5-2.5 % EX CREA: 1.0000 "application " | TOPICAL_CREAM | CUTANEOUS | 0 refills | Status: DC | PRN

## 2018-11-24 MED ORDER — DEXAMETHASONE SODIUM PHOSPHATE 10 MG/ML IJ SOLN
10.0000 mg | Freq: Once | INTRAMUSCULAR | Status: AC
  Administered 2018-11-24: 10 mg via INTRAVENOUS

## 2018-11-24 MED ORDER — SODIUM CHLORIDE 0.9% FLUSH
10.0000 mL | INTRAVENOUS | Status: DC | PRN
  Administered 2018-11-24: 10 mL
  Filled 2018-11-24: qty 10

## 2018-11-24 MED ORDER — SODIUM CHLORIDE 0.9 % IV SOLN
Freq: Once | INTRAVENOUS | Status: AC
  Administered 2018-11-24: 08:00:00 via INTRAVENOUS
  Filled 2018-11-24: qty 250

## 2018-11-24 MED ORDER — SODIUM CHLORIDE 0.9 % IV SOLN
90.0000 mg/m2 | Freq: Once | INTRAVENOUS | Status: AC
  Administered 2018-11-24: 190 mg via INTRAVENOUS
  Filled 2018-11-24: qty 9.5

## 2018-11-24 NOTE — Patient Instructions (Signed)
[facility] Discharge Instructions for Patients Receiving Chemotherapy  Today you received the following chemotherapy agents etoposide  To help prevent nausea and vomiting after your treatment, we encourage you to take your nausea medication as directed  If you develop nausea and vomiting that is not controlled by your nausea medication, call the clinic.   BELOW ARE SYMPTOMS THAT SHOULD BE REPORTED IMMEDIATELY:  *FEVER GREATER THAN 100.5 F  *CHILLS WITH OR WITHOUT FEVER  NAUSEA AND VOMITING THAT IS NOT CONTROLLED WITH YOUR NAUSEA MEDICATION  *UNUSUAL SHORTNESS OF BREATH  *UNUSUAL BRUISING OR BLEEDING  TENDERNESS IN MOUTH AND THROAT WITH OR WITHOUT PRESENCE OF ULCERS  *URINARY PROBLEMS  *BOWEL PROBLEMS  UNUSUAL RASH Items with * indicate a potential emergency and should be followed up as soon as possible.  Feel free to call the clinic you have any questions or concerns. The clinic phone number is [phone].

## 2018-11-24 NOTE — Progress Notes (Signed)
Dr Julien Nordmann will not refill Lorrin Mais as it was prescribed by another provider.

## 2018-11-25 ENCOUNTER — Inpatient Hospital Stay: Payer: Medicare Other

## 2018-11-27 ENCOUNTER — Other Ambulatory Visit: Payer: Self-pay | Admitting: Medical Oncology

## 2018-11-27 ENCOUNTER — Telehealth: Payer: Self-pay | Admitting: Medical Oncology

## 2018-11-27 ENCOUNTER — Telehealth: Payer: Self-pay | Admitting: *Deleted

## 2018-11-27 ENCOUNTER — Inpatient Hospital Stay: Payer: Medicare Other

## 2018-11-27 DIAGNOSIS — C3411 Malignant neoplasm of upper lobe, right bronchus or lung: Secondary | ICD-10-CM

## 2018-11-27 DIAGNOSIS — Z5111 Encounter for antineoplastic chemotherapy: Secondary | ICD-10-CM | POA: Diagnosis not present

## 2018-11-27 DIAGNOSIS — Z95828 Presence of other vascular implants and grafts: Secondary | ICD-10-CM

## 2018-11-27 DIAGNOSIS — E876 Hypokalemia: Secondary | ICD-10-CM | POA: Diagnosis not present

## 2018-11-27 LAB — CBC WITH DIFFERENTIAL (CANCER CENTER ONLY)
Abs Immature Granulocytes: 0.01 10*3/uL (ref 0.00–0.07)
Basophils Absolute: 0 10*3/uL (ref 0.0–0.1)
Basophils Relative: 0 %
EOS ABS: 0 10*3/uL (ref 0.0–0.5)
Eosinophils Relative: 0 %
HCT: 29.8 % — ABNORMAL LOW (ref 39.0–52.0)
Hemoglobin: 9.6 g/dL — ABNORMAL LOW (ref 13.0–17.0)
IMMATURE GRANULOCYTES: 0 %
Lymphocytes Relative: 4 %
Lymphs Abs: 0.1 10*3/uL — ABNORMAL LOW (ref 0.7–4.0)
MCH: 27.4 pg (ref 26.0–34.0)
MCHC: 32.2 g/dL (ref 30.0–36.0)
MCV: 85.1 fL (ref 80.0–100.0)
Monocytes Absolute: 0.1 10*3/uL (ref 0.1–1.0)
Monocytes Relative: 4 %
NEUTROS PCT: 92 %
Neutro Abs: 2.8 10*3/uL (ref 1.7–7.7)
Platelet Count: 150 10*3/uL (ref 150–400)
RBC: 3.5 MIL/uL — ABNORMAL LOW (ref 4.22–5.81)
RDW: 16.3 % — ABNORMAL HIGH (ref 11.5–15.5)
WBC Count: 3 10*3/uL — ABNORMAL LOW (ref 4.0–10.5)
nRBC: 0 % (ref 0.0–0.2)

## 2018-11-27 LAB — CMP (CANCER CENTER ONLY)
ALT: 12 U/L (ref 0–44)
AST: 14 U/L — AB (ref 15–41)
Albumin: 2.9 g/dL — ABNORMAL LOW (ref 3.5–5.0)
Alkaline Phosphatase: 67 U/L (ref 38–126)
Anion gap: 12 (ref 5–15)
BUN: 29 mg/dL — AB (ref 6–20)
CO2: 25 mmol/L (ref 22–32)
Calcium: 9 mg/dL (ref 8.9–10.3)
Chloride: 105 mmol/L (ref 98–111)
Creatinine: 1.94 mg/dL — ABNORMAL HIGH (ref 0.61–1.24)
GFR, EST NON AFRICAN AMERICAN: 39 mL/min — AB (ref >60)
GFR, Est AFR Am: 45 mL/min — ABNORMAL LOW (ref >60)
Glucose, Bld: 92 mg/dL (ref 70–99)
Potassium: 3.6 mmol/L (ref 3.5–5.1)
Sodium: 142 mmol/L (ref 135–145)
Total Bilirubin: 0.5 mg/dL (ref 0.3–1.2)
Total Protein: 6.7 g/dL (ref 6.5–8.1)

## 2018-11-27 LAB — MAGNESIUM: Magnesium: 1.4 mg/dL — CL (ref 1.7–2.4)

## 2018-11-27 MED ORDER — LIDOCAINE-PRILOCAINE 2.5-2.5 % EX CREA: 1.0000 "application " | TOPICAL_CREAM | CUTANEOUS | 0 refills | Status: DC | PRN

## 2018-11-27 MED FILL — LIDOCAINE-PRILOCAINE CREAM: 2.5-2.5 | 7 days supply | Qty: 30 | Fill #0

## 2018-11-27 MED FILL — ZOLPIDEM TARTRATE 5 MG TAB: 5 | 30 days supply | Qty: 30 | Fill #2

## 2018-11-27 MED FILL — PROCHLORPERAZINE 10 MG TAB: 10 | 7 days supply | Qty: 30 | Fill #0

## 2018-11-27 NOTE — Telephone Encounter (Signed)
Called in emla cream

## 2018-11-27 NOTE — Telephone Encounter (Signed)
Lab call report received.  Today's Mg = 1.4.  CT scan is next appointment scheduled today.

## 2018-12-01 ENCOUNTER — Emergency Department (HOSPITAL_COMMUNITY): Payer: Medicare Other

## 2018-12-01 ENCOUNTER — Ambulatory Visit (HOSPITAL_COMMUNITY)
Admission: RE | Admit: 2018-12-01 | Discharge: 2018-12-01 | Disposition: A | Discharge status: Home / Self Care | Payer: Medicare Other | Source: Ambulatory Visit | Attending: Internal Medicine | Admitting: Internal Medicine

## 2018-12-01 ENCOUNTER — Inpatient Hospital Stay (HOSPITAL_COMMUNITY)
Admission: EM | Admit: 2018-12-01 | Discharge: 2018-12-13 | DRG: 193 | Disposition: A | Discharge status: Home Health | Payer: Medicare Other | Attending: Internal Medicine | Admitting: Internal Medicine

## 2018-12-01 ENCOUNTER — Other Ambulatory Visit: Payer: Self-pay | Admitting: Internal Medicine

## 2018-12-01 ENCOUNTER — Other Ambulatory Visit: Payer: Self-pay

## 2018-12-01 ENCOUNTER — Encounter (HOSPITAL_COMMUNITY): Payer: Self-pay

## 2018-12-01 DIAGNOSIS — Z9861 Coronary angioplasty status: Secondary | ICD-10-CM

## 2018-12-01 DIAGNOSIS — G893 Neoplasm related pain (acute) (chronic): Secondary | ICD-10-CM | POA: Diagnosis present

## 2018-12-01 DIAGNOSIS — J1000 Influenza due to other identified influenza virus with unspecified type of pneumonia: Principal | ICD-10-CM | POA: Diagnosis present

## 2018-12-01 DIAGNOSIS — Z955 Presence of coronary angioplasty implant and graft: Secondary | ICD-10-CM

## 2018-12-01 DIAGNOSIS — Z6827 Body mass index (BMI) 27.0-27.9, adult: Secondary | ICD-10-CM

## 2018-12-01 DIAGNOSIS — Y842 Radiological procedure and radiotherapy as the cause of abnormal reaction of the patient, or of later complication, without mention of misadventure at the time of the procedure: Secondary | ICD-10-CM | POA: Diagnosis present

## 2018-12-01 DIAGNOSIS — J181 Lobar pneumonia, unspecified organism: Secondary | ICD-10-CM

## 2018-12-01 DIAGNOSIS — Z7902 Long term (current) use of antithrombotics/antiplatelets: Secondary | ICD-10-CM

## 2018-12-01 DIAGNOSIS — M79606 Pain in leg, unspecified: Secondary | ICD-10-CM | POA: Diagnosis present

## 2018-12-01 DIAGNOSIS — J189 Pneumonia, unspecified organism: Secondary | ICD-10-CM | POA: Diagnosis present

## 2018-12-01 DIAGNOSIS — N179 Acute kidney failure, unspecified: Secondary | ICD-10-CM | POA: Diagnosis present

## 2018-12-01 DIAGNOSIS — J690 Pneumonitis due to inhalation of food and vomit: Secondary | ICD-10-CM | POA: Diagnosis not present

## 2018-12-01 DIAGNOSIS — R112 Nausea with vomiting, unspecified: Secondary | ICD-10-CM | POA: Diagnosis not present

## 2018-12-01 DIAGNOSIS — F329 Major depressive disorder, single episode, unspecified: Secondary | ICD-10-CM | POA: Diagnosis present

## 2018-12-01 DIAGNOSIS — R1310 Dysphagia, unspecified: Secondary | ICD-10-CM

## 2018-12-01 DIAGNOSIS — I10 Essential (primary) hypertension: Secondary | ICD-10-CM | POA: Diagnosis not present

## 2018-12-01 DIAGNOSIS — Z923 Personal history of irradiation: Secondary | ICD-10-CM

## 2018-12-01 DIAGNOSIS — C3411 Malignant neoplasm of upper lobe, right bronchus or lung: Secondary | ICD-10-CM | POA: Diagnosis present

## 2018-12-01 DIAGNOSIS — K222 Esophageal obstruction: Secondary | ICD-10-CM

## 2018-12-01 DIAGNOSIS — E46 Unspecified protein-calorie malnutrition: Secondary | ICD-10-CM | POA: Diagnosis present

## 2018-12-01 DIAGNOSIS — F419 Anxiety disorder, unspecified: Secondary | ICD-10-CM | POA: Diagnosis present

## 2018-12-01 DIAGNOSIS — K219 Gastro-esophageal reflux disease without esophagitis: Secondary | ICD-10-CM | POA: Diagnosis present

## 2018-12-01 DIAGNOSIS — J440 Chronic obstructive pulmonary disease with acute lower respiratory infection: Secondary | ICD-10-CM | POA: Diagnosis not present

## 2018-12-01 DIAGNOSIS — N182 Chronic kidney disease, stage 2 (mild): Secondary | ICD-10-CM | POA: Diagnosis present

## 2018-12-01 DIAGNOSIS — Z79899 Other long term (current) drug therapy: Secondary | ICD-10-CM

## 2018-12-01 DIAGNOSIS — Z888 Allergy status to other drugs, medicaments and biological substances status: Secondary | ICD-10-CM

## 2018-12-01 DIAGNOSIS — Z833 Family history of diabetes mellitus: Secondary | ICD-10-CM

## 2018-12-01 DIAGNOSIS — E860 Dehydration: Secondary | ICD-10-CM

## 2018-12-01 DIAGNOSIS — L589 Radiodermatitis, unspecified: Secondary | ICD-10-CM

## 2018-12-01 DIAGNOSIS — T451X5A Adverse effect of antineoplastic and immunosuppressive drugs, initial encounter: Secondary | ICD-10-CM | POA: Diagnosis present

## 2018-12-01 DIAGNOSIS — Z87891 Personal history of nicotine dependence: Secondary | ICD-10-CM

## 2018-12-01 DIAGNOSIS — N183 Chronic kidney disease, stage 3 unspecified: Secondary | ICD-10-CM | POA: Diagnosis present

## 2018-12-01 DIAGNOSIS — N400 Enlarged prostate without lower urinary tract symptoms: Secondary | ICD-10-CM | POA: Diagnosis present

## 2018-12-01 DIAGNOSIS — G4730 Sleep apnea, unspecified: Secondary | ICD-10-CM | POA: Diagnosis present

## 2018-12-01 DIAGNOSIS — I2510 Atherosclerotic heart disease of native coronary artery without angina pectoris: Secondary | ICD-10-CM

## 2018-12-01 DIAGNOSIS — Z7951 Long term (current) use of inhaled steroids: Secondary | ICD-10-CM

## 2018-12-01 DIAGNOSIS — D702 Other drug-induced agranulocytosis: Secondary | ICD-10-CM | POA: Diagnosis not present

## 2018-12-01 DIAGNOSIS — K208 Other esophagitis without bleeding: Secondary | ICD-10-CM

## 2018-12-01 DIAGNOSIS — E440 Moderate protein-calorie malnutrition: Secondary | ICD-10-CM

## 2018-12-01 DIAGNOSIS — D61810 Antineoplastic chemotherapy induced pancytopenia: Secondary | ICD-10-CM | POA: Diagnosis not present

## 2018-12-01 DIAGNOSIS — D701 Agranulocytosis secondary to cancer chemotherapy: Secondary | ICD-10-CM

## 2018-12-01 DIAGNOSIS — Z7982 Long term (current) use of aspirin: Secondary | ICD-10-CM

## 2018-12-01 DIAGNOSIS — R627 Adult failure to thrive: Secondary | ICD-10-CM | POA: Diagnosis present

## 2018-12-01 DIAGNOSIS — E1122 Type 2 diabetes mellitus with diabetic chronic kidney disease: Secondary | ICD-10-CM | POA: Diagnosis present

## 2018-12-01 DIAGNOSIS — K429 Umbilical hernia without obstruction or gangrene: Secondary | ICD-10-CM | POA: Diagnosis not present

## 2018-12-01 DIAGNOSIS — M545 Low back pain: Secondary | ICD-10-CM | POA: Diagnosis present

## 2018-12-01 DIAGNOSIS — I129 Hypertensive chronic kidney disease with stage 1 through stage 4 chronic kidney disease, or unspecified chronic kidney disease: Secondary | ICD-10-CM | POA: Diagnosis present

## 2018-12-01 DIAGNOSIS — E785 Hyperlipidemia, unspecified: Secondary | ICD-10-CM | POA: Diagnosis present

## 2018-12-01 DIAGNOSIS — D6959 Other secondary thrombocytopenia: Secondary | ICD-10-CM | POA: Diagnosis present

## 2018-12-01 LAB — COMPREHENSIVE METABOLIC PANEL
ALT: 21 U/L (ref 0–44)
AST: 20 U/L (ref 15–41)
Albumin: 3.1 g/dL — ABNORMAL LOW (ref 3.5–5.0)
Alkaline Phosphatase: 64 U/L (ref 38–126)
Anion gap: 12 (ref 5–15)
BUN: 32 mg/dL — AB (ref 6–20)
CO2: 25 mmol/L (ref 22–32)
Calcium: 8.7 mg/dL — ABNORMAL LOW (ref 8.9–10.3)
Chloride: 105 mmol/L (ref 98–111)
Creatinine, Ser: 1.7 mg/dL — ABNORMAL HIGH (ref 0.61–1.24)
GFR calc Af Amer: 53 mL/min — ABNORMAL LOW (ref >60)
GFR calc non Af Amer: 45 mL/min — ABNORMAL LOW (ref >60)
Glucose, Bld: 88 mg/dL (ref 70–99)
Potassium: 3.5 mmol/L (ref 3.5–5.1)
Sodium: 142 mmol/L (ref 135–145)
Total Bilirubin: 0.7 mg/dL (ref 0.3–1.2)
Total Protein: 6.8 g/dL (ref 6.5–8.1)

## 2018-12-01 LAB — CBC
HCT: 28.4 % — ABNORMAL LOW (ref 39.0–52.0)
Hemoglobin: 9.1 g/dL — ABNORMAL LOW (ref 13.0–17.0)
MCH: 27.8 pg (ref 26.0–34.0)
MCHC: 32 g/dL (ref 30.0–36.0)
MCV: 86.9 fL (ref 80.0–100.0)
Platelets: 43 10*3/uL — ABNORMAL LOW (ref 150–400)
RBC: 3.27 MIL/uL — ABNORMAL LOW (ref 4.22–5.81)
RDW: 15.5 % (ref 11.5–15.5)
WBC: 0.9 10*3/uL — CL (ref 4.0–10.5)
nRBC: 0 % (ref 0.0–0.2)

## 2018-12-01 LAB — DIFFERENTIAL
Basophils Absolute: 0 10*3/uL (ref 0.0–0.1)
Basophils Relative: 0 %
Eosinophils Absolute: 0 10*3/uL (ref 0.0–0.5)
Eosinophils Relative: 2 %
LYMPHS ABS: 0.1 10*3/uL — AB (ref 0.7–4.0)
Lymphocytes Relative: 14 %
MONOS PCT: 4 %
Monocytes Absolute: 0 10*3/uL — ABNORMAL LOW (ref 0.1–1.0)
Neutro Abs: 0.7 10*3/uL — ABNORMAL LOW (ref 1.7–7.7)
Neutrophils Relative %: 77 %

## 2018-12-01 LAB — TROPONIN I: Troponin I: <0.03 ng/mL (ref <0.03)

## 2018-12-01 LAB — MAGNESIUM: Magnesium: 1.4 mg/dL — ABNORMAL LOW (ref 1.7–2.4)

## 2018-12-01 LAB — LIPASE, BLOOD: Lipase: 31 U/L (ref 11–51)

## 2018-12-01 MED ORDER — SODIUM CHLORIDE 0.9 % IV SOLN
1.0000 g | Freq: Three times a day (TID) | INTRAVENOUS | Status: DC
  Filled 2018-12-01: qty 1

## 2018-12-01 MED ORDER — VANCOMYCIN HCL IN DEXTROSE 1-5 GM/200ML-% IV SOLN: 1000.0000 mg | Freq: Once | INTRAVENOUS | Status: DC

## 2018-12-01 MED ORDER — MORPHINE SULFATE (PF) 4 MG/ML IV SOLN
4.0000 mg | INTRAVENOUS | Status: DC | PRN
  Administered 2018-12-01: 4 mg via INTRAVENOUS
  Filled 2018-12-01: qty 1

## 2018-12-01 MED ORDER — VITAMIN D 25 MCG (1000 UNIT) PO TABS
2000.0000 [IU] | ORAL_TABLET | Freq: Every day | ORAL | Status: DC
  Administered 2018-12-02 – 2018-12-13 (×9): 2000 [IU] via ORAL
  Filled 2018-12-01 (×12): qty 2

## 2018-12-01 MED ORDER — TIOTROPIUM BROMIDE MONOHYDRATE 18 MCG IN CAPS: 18.0000 ug | ORAL_CAPSULE | Freq: Every day | RESPIRATORY_TRACT | Status: DC

## 2018-12-01 MED ORDER — PROCHLORPERAZINE MALEATE 10 MG PO TABS: 10.0000 mg | ORAL_TABLET | Freq: Four times a day (QID) | ORAL | Status: DC | PRN

## 2018-12-01 MED ORDER — ZOLPIDEM TARTRATE 5 MG PO TABS
5.0000 mg | ORAL_TABLET | Freq: Every day | ORAL | Status: DC
  Administered 2018-12-02 – 2018-12-12 (×11): 5 mg via ORAL
  Filled 2018-12-01 (×11): qty 1

## 2018-12-01 MED ORDER — ASPIRIN EC 81 MG PO TBEC
81.0000 mg | DELAYED_RELEASE_TABLET | Freq: Every day | ORAL | Status: DC
  Administered 2018-12-02 – 2018-12-03 (×2): 81 mg via ORAL
  Filled 2018-12-01 (×2): qty 1

## 2018-12-01 MED ORDER — VITAMIN D 50 MCG (2000 UT) PO TABS: 2000.0000 [IU] | ORAL_TABLET | Freq: Every day | ORAL | Status: DC

## 2018-12-01 MED ORDER — ONDANSETRON HCL 4 MG PO TABS: 4.0000 mg | ORAL_TABLET | Freq: Four times a day (QID) | ORAL | Status: DC | PRN

## 2018-12-01 MED ORDER — ROSUVASTATIN CALCIUM 20 MG PO TABS
20.0000 mg | ORAL_TABLET | Freq: Every day | ORAL | Status: DC
  Administered 2018-12-02 – 2018-12-13 (×9): 20 mg via ORAL
  Filled 2018-12-01 (×13): qty 1

## 2018-12-01 MED ORDER — PRAZOSIN HCL 1 MG PO CAPS
1.0000 mg | ORAL_CAPSULE | Freq: Every day | ORAL | Status: DC
  Administered 2018-12-02 – 2018-12-12 (×10): 1 mg via ORAL
  Filled 2018-12-01 (×13): qty 1

## 2018-12-01 MED ORDER — ACETAMINOPHEN 650 MG RE SUPP: 650.0000 mg | Freq: Four times a day (QID) | RECTAL | Status: DC | PRN

## 2018-12-01 MED ORDER — DRONABINOL 5 MG PO CAPS
5.0000 mg | ORAL_CAPSULE | Freq: Two times a day (BID) | ORAL | Status: DC
  Administered 2018-12-02 – 2018-12-13 (×15): 5 mg via ORAL
  Filled 2018-12-01 (×19): qty 1

## 2018-12-01 MED ORDER — RANOLAZINE ER 500 MG PO TB12: 1000.0000 mg | ORAL_TABLET | Freq: Two times a day (BID) | ORAL | Status: DC

## 2018-12-01 MED ORDER — SODIUM CHLORIDE 0.9 % IV BOLUS
500.0000 mL | Freq: Once | INTRAVENOUS | Status: AC
  Administered 2018-12-01: 500 mL via INTRAVENOUS

## 2018-12-01 MED ORDER — PIPERACILLIN-TAZOBACTAM 3.375 G IVPB 30 MIN
3.3750 g | Freq: Once | INTRAVENOUS | Status: AC
  Administered 2018-12-01: 3.375 g via INTRAVENOUS
  Filled 2018-12-01: qty 50

## 2018-12-01 MED ORDER — UMECLIDINIUM BROMIDE 62.5 MCG/INH IN AEPB
1.0000 | INHALATION_SPRAY | Freq: Every day | RESPIRATORY_TRACT | Status: DC
  Administered 2018-12-02 – 2018-12-13 (×11): 1 via RESPIRATORY_TRACT
  Filled 2018-12-01 (×3): qty 7

## 2018-12-01 MED ORDER — SODIUM CHLORIDE 0.9 % IV SOLN
2.0000 g | Freq: Two times a day (BID) | INTRAVENOUS | Status: DC
  Administered 2018-12-02 – 2018-12-08 (×14): 2 g via INTRAVENOUS
  Filled 2018-12-01 (×15): qty 2

## 2018-12-01 MED ORDER — POTASSIUM CHLORIDE CRYS ER 20 MEQ PO TBCR
40.0000 meq | EXTENDED_RELEASE_TABLET | Freq: Once | ORAL | Status: AC
  Administered 2018-12-01: 40 meq via ORAL
  Filled 2018-12-01: qty 2

## 2018-12-01 MED ORDER — BENZONATATE 100 MG PO CAPS: 100.0000 mg | ORAL_CAPSULE | Freq: Three times a day (TID) | ORAL | Status: DC | PRN

## 2018-12-01 MED ORDER — SODIUM CHLORIDE 0.9 % IV SOLN
INTRAVENOUS | Status: DC
  Administered 2018-12-01 – 2018-12-05 (×9): via INTRAVENOUS

## 2018-12-01 MED ORDER — FLUOXETINE HCL 40 MG PO CAPS: 40.0000 mg | ORAL_CAPSULE | Freq: Every day | ORAL | Status: DC

## 2018-12-01 MED ORDER — PROMETHAZINE HCL 25 MG PO TABS: 25.0000 mg | ORAL_TABLET | Freq: Four times a day (QID) | ORAL | Status: DC | PRN

## 2018-12-01 MED ORDER — ACETAMINOPHEN 325 MG PO TABS: 650.0000 mg | ORAL_TABLET | Freq: Four times a day (QID) | ORAL | Status: DC | PRN

## 2018-12-01 MED ORDER — ALBUTEROL SULFATE HFA 108 (90 BASE) MCG/ACT IN AERS: 1.0000 | INHALATION_SPRAY | Freq: Four times a day (QID) | RESPIRATORY_TRACT | Status: DC | PRN

## 2018-12-01 MED ORDER — MAGNESIUM SULFATE 2 GM/50ML IV SOLN
2.0000 g | Freq: Once | INTRAVENOUS | Status: AC
  Administered 2018-12-01: 2 g via INTRAVENOUS
  Filled 2018-12-01: qty 50

## 2018-12-01 MED ORDER — FLUOXETINE HCL 20 MG PO CAPS
40.0000 mg | ORAL_CAPSULE | Freq: Every day | ORAL | Status: DC
  Administered 2018-12-02 – 2018-12-13 (×9): 40 mg via ORAL
  Filled 2018-12-01 (×11): qty 2

## 2018-12-01 MED ORDER — PANTOPRAZOLE SODIUM 40 MG PO TBEC
40.0000 mg | DELAYED_RELEASE_TABLET | Freq: Every day | ORAL | Status: DC
  Administered 2018-12-02 – 2018-12-04 (×3): 40 mg via ORAL
  Filled 2018-12-01 (×3): qty 1

## 2018-12-01 MED ORDER — HYDROCODONE-ACETAMINOPHEN 5-325 MG PO TABS
1.0000 | ORAL_TABLET | Freq: Four times a day (QID) | ORAL | Status: DC | PRN
  Administered 2018-12-02 – 2018-12-13 (×3): 1 via ORAL
  Filled 2018-12-01 (×6): qty 1

## 2018-12-01 MED ORDER — ONDANSETRON HCL 4 MG/2ML IJ SOLN
4.0000 mg | INTRAMUSCULAR | Status: AC | PRN
  Administered 2018-12-01 – 2018-12-02 (×2): 4 mg via INTRAVENOUS
  Filled 2018-12-01 (×3): qty 2

## 2018-12-01 MED ORDER — ALBUTEROL SULFATE (2.5 MG/3ML) 0.083% IN NEBU: 2.5000 mg | INHALATION_SOLUTION | Freq: Four times a day (QID) | RESPIRATORY_TRACT | Status: DC | PRN

## 2018-12-01 MED ORDER — CLOPIDOGREL BISULFATE 75 MG PO TABS
75.0000 mg | ORAL_TABLET | Freq: Every day | ORAL | Status: DC
  Administered 2018-12-02 – 2018-12-03 (×2): 75 mg via ORAL
  Filled 2018-12-01 (×2): qty 1

## 2018-12-01 MED ORDER — MIRTAZAPINE 30 MG PO TABS
30.0000 mg | ORAL_TABLET | Freq: Every day | ORAL | Status: DC
  Administered 2018-12-02 – 2018-12-12 (×10): 30 mg via ORAL
  Filled 2018-12-01 (×10): qty 1

## 2018-12-01 MED ORDER — SUCRALFATE 1 G PO TABS
1.0000 g | ORAL_TABLET | Freq: Three times a day (TID) | ORAL | Status: DC
  Administered 2018-12-02 – 2018-12-04 (×8): 1 g via ORAL
  Filled 2018-12-01 (×8): qty 1

## 2018-12-01 MED ORDER — VITAMIN B-12 1000 MCG PO TABS
500.0000 ug | ORAL_TABLET | Freq: Every day | ORAL | Status: DC
  Administered 2018-12-02 – 2018-12-13 (×9): 500 ug via ORAL
  Filled 2018-12-01 (×13): qty 1

## 2018-12-01 MED ORDER — MORPHINE SULFATE (PF) 2 MG/ML IV SOLN
2.0000 mg | INTRAVENOUS | Status: DC | PRN
  Administered 2018-12-02 (×2): 4 mg via INTRAVENOUS
  Filled 2018-12-01 (×2): qty 2

## 2018-12-01 MED ORDER — ONDANSETRON HCL 4 MG/2ML IJ SOLN: 4.0000 mg | Freq: Four times a day (QID) | INTRAMUSCULAR | Status: DC | PRN

## 2018-12-01 MED ORDER — RANOLAZINE ER 500 MG PO TB12
500.0000 mg | ORAL_TABLET | Freq: Two times a day (BID) | ORAL | Status: DC
  Administered 2018-12-02 – 2018-12-13 (×19): 500 mg via ORAL
  Filled 2018-12-01 (×25): qty 1

## 2018-12-01 MED ORDER — MOMETASONE FURO-FORMOTEROL FUM 100-5 MCG/ACT IN AERO
2.0000 | INHALATION_SPRAY | Freq: Two times a day (BID) | RESPIRATORY_TRACT | Status: DC
  Administered 2018-12-02 – 2018-12-13 (×21): 2 via RESPIRATORY_TRACT
  Filled 2018-12-01 (×2): qty 8.8

## 2018-12-01 NOTE — Progress Notes (Addendum)
Pharmacy Antibiotic Note  Andrew West is a 53 y.o. male admitted on 12/01/2018 with PNA. Patient noted to be neutropenic. Pharmacy has been consulted for Cefepime dosing.  Plan:  Cefepime 2g IV q12 hr  Pharmacy will sign off, following peripherally for renal adjustments and culture results  Height: 5\' 9"  (175.3 cm) Weight: 198 lb 6.6 oz (90 kg) IBW/kg (Calculated) : 70.7  Temp (24hrs), Avg:98.4 F (36.9 C), Min:98.4 F (36.9 C), Max:98.4 F (36.9 C)  Recent Labs  Lab 11/27/18 0852 12/01/18 1918  WBC 3.0* 0.9*  CREATININE 1.94* 1.70*    Estimated Creatinine Clearance: 56.4 mL/min (A) (by C-G formula based on SCr of 1.7 mg/dL (H)).    Allergies  Allergen Reactions  . Iohexol Anaphylaxis    PT. TO BE PREMEDICATED PRIOR TO IV CONTRAST PER DR Kris Hartmann /MMS//12/15/15Desc: PT BECAME SOB AND CHEST TIGHTNESS AFTER CONTRAST INJECTION.  [name],RT-RCT., Onset Date: [id number]      Thank you for allowing pharmacy to be a part of this patient's care.  Reuel Boom, PharmD, BCPS [phone] 12/01/2018, 10:10 PM

## 2018-12-01 NOTE — ED Notes (Signed)
Patient transported to CT 

## 2018-12-01 NOTE — H&P (Addendum)
History and Physical    Andrew West AOZ:[id number] DOB: October 25, 1966 DOA: 12/01/2018  PCP: [name], MD  Patient coming from: Home  I have personally briefly reviewed patient's old medical records in [facility]  Chief Complaint: N/V  HPI: Andrew West is a 53 y.o. male with medical history significant of SCLC chemo x8 days ago, HTN, CAD.  Patient presents to the ED with c/o multiple episodes of N/V for past couple of days.  Associated generalized weakness.  Generalized abd pain.  Poor PO intake.  No CP, no SOB, does have cough, no fevers.  No melena nor hematochezia, vomit is NBNB   ED Course: CT today shows ? RLL PNA.  WBC 0.9k, HGB 9.1, platelets 43.   Review of Systems: As per HPI otherwise 10 point review of systems negative.   Past Medical History:  Diagnosis Date  . Anxiety   . CAD (coronary artery disease)    a. s/p multiple PCIs with last cath 11/2016 with severe multivessel CAD, s/p PCTA to LCx but unable to pass stent  . Chronic leg pain    bilateral  . Chronic lower back pain   . COPD (chronic obstructive pulmonary disease) (HCC)   . Depression   . GERD (gastroesophageal reflux disease)    Takes Dexilant  . HLD (hyperlipidemia)   . Hypertension   . Rhabdomyolysis    h/o, r/t statins  . Sleep apnea    "can't tolerate mask" (12/16/2016)  . Type II diabetes mellitus (HCC)     Past Surgical History:  Procedure Laterality Date  . BACK SURGERY    . BRONCHIAL BIOPSY  08/25/2018   Procedure: BRONCHIAL BIOPSIES;  Surgeon: Josephine Igo, DO;  Location: [facility] ENDOSCOPY;  Service: Cardiopulmonary;;  . CARDIAC CATHETERIZATION N/A 09/25/2015   Procedure: Left Heart Cath and Coronary Angiography;  Surgeon: Marykay Lex, MD;  Location: [facility] INVASIVE CV LAB;  Service: Cardiovascular;  Laterality: N/A;  . CARDIAC CATHETERIZATION N/A 12/16/2016   Procedure: Left Heart Cath and Coronary Angiography;  Surgeon: Marykay Lex, MD;  Location: [facility] INVASIVE CV  LAB;  Service: Cardiovascular;  Laterality: N/A;  . CARDIAC CATHETERIZATION N/A 12/16/2016   Procedure: Coronary Balloon Angioplasty;  Surgeon: Marykay Lex, MD;  Location: [facility] INVASIVE CV LAB;  Service: Cardiovascular;  Laterality: N/A;  . COLONOSCOPY W/ POLYPECTOMY    . CORONARY ANGIOPLASTY  09/25/2015   mid cir & om  . CORONARY ANGIOPLASTY WITH STENT PLACEMENT  10/09/2001   PTCA & stenting of mid AV circumflex; 2.5x51mm Pixel stent  . CORONARY ANGIOPLASTY WITH STENT PLACEMENT  12/13/2001   PCI with stent to mid L circumflex, 95% stenosis to 0% residual  . CORONARY ANGIOPLASTY WITH STENT PLACEMENT  10/10/2003   PCI to mid AV circumflex; LAD 30% disease; RCA 100% occluded prox.  . CORONARY ANGIOPLASTY WITH STENT PLACEMENT  09/01/2011   PCI with stenting with bare metal stent to mid AV groove circumflex and PDA  . CORONARY ANGIOPLASTY WITH STENT PLACEMENT  10/17/2011   cutting balloon angioplasty of ostial lateral OM1 branch and bifurcation AV groove circumflex OM junction; stenosis reduced to 0%  . ENDOBRONCHIAL ULTRASOUND Bilateral 08/25/2018   Procedure: ENDOBRONCHIAL ULTRASOUND;  Surgeon: Josephine Igo, DO;  Location: [facility] ENDOSCOPY;  Service: Cardiopulmonary;  Laterality: Bilateral;  . EXCISIONAL HEMORRHOIDECTOMY    . FINE NEEDLE ASPIRATION  08/25/2018   Procedure: FINE NEEDLE ASPIRATION;  Surgeon: Josephine Igo, DO;  Location: [facility]OS[facility];  Service: Cardiopulmonary;;  .  FLEXIBLE BRONCHOSCOPY  08/25/2018   Procedure: FLEXIBLE BRONCHOSCOPY;  Surgeon: Josephine Igo, DO;  Location: [facility] ENDOSCOPY;  Service: Cardiopulmonary;;  . IR IMAGING GUIDED PORT INSERTION  09/29/2018  . LEFT HEART CATHETERIZATION WITH CORONARY ANGIOGRAM N/A 10/18/2011   Procedure: LEFT HEART CATHETERIZATION WITH CORONARY ANGIOGRAM;  Surgeon: Marykay Lex, MD;  Location: [facility] CATH LAB;  Service: Cardiovascular;  Laterality: N/A;  . LUMBAR LAMINECTOMY/DECOMPRESSION MICRODISCECTOMY  03/31/2012   Procedure: LUMBAR  LAMINECTOMY/DECOMPRESSION MICRODISCECTOMY 1 LEVEL;  Surgeon: Temple Pacini, MD;  Location: [facility] NEURO ORS;  Service: Neurosurgery;  Laterality: Left;  . TRANSTHORACIC ECHOCARDIOGRAM  07/28/2011   EF 55-65%; LVH, grade 1 diastolic dysfunction;      reports that he quit smoking about 3 years ago. His smoking use included cigarettes. He has a 6.25 pack-year smoking history. He has never used smokeless tobacco. He reports that he does not drink alcohol or use drugs.  Allergies  Allergen Reactions  . Iohexol Anaphylaxis    PT. TO BE PREMEDICATED PRIOR TO IV CONTRAST PER DR Eppie Gibson /MMS//12/15/15Desc: PT BECAME SOB AND CHEST TIGHTNESS AFTER CONTRAST INJECTION.  [name],RT-RCT., Onset Date: [id number]     Family History  Problem Relation Age of Onset  . Heart attack Father   . Hypertension Mother   . Diabetes Mother   . Heart disease Brother        x 3   . Heart attack Brother        deceased  . Hypertension Sister   . Diabetes Sister   . Anesthesia problems Neg Hx   . Hypotension Neg Hx   . Malignant hyperthermia Neg Hx   . Pseudochol deficiency Neg Hx      Prior to Admission medications   Medication Sig Start Date End Date Taking? Authorizing Provider  albuterol (PROAIR HFA) 108 (90 Base) MCG/ACT inhaler Inhale 1 puff into the lungs every 6 (six) hours as needed for wheezing or shortness of breath. 08/29/18  Yes [name], MD  amLODipine (NORVASC) 10 MG tablet Take 1 tablet (10 mg total) by mouth daily. 04/20/18  Yes [name], MD  aspirin 81 MG tablet Take 1 tablet (81 mg total) daily by mouth. 10/10/17  Yes Newlin, Enobong, MD  Cholecalciferol (VITAMIN D) 2000 units tablet Take 1 tablet (2,000 Units total) by mouth daily. 08/04/18  Yes Marcello Fennel, MD  FLUoxetine (PROZAC) 40 MG capsule Take 1 capsule (40 mg total) by mouth daily. 04/20/18  Yes Newlin, Odette Horns, MD  Fluticasone-Salmeterol (ADVAIR) 100-50 MCG/DOSE AEPB Inhale 1 puff into the lungs 2 (two) times daily.  07/25/18  Yes [name], MD  mirtazapine (REMERON) 30 MG tablet Take 1 tablet (30 mg total) by mouth at bedtime. 08/31/18  Yes Si Gaul, MD  nicotine (NICODERM CQ) 21 mg/24hr patch Place 1 patch (21 mg total) onto the skin daily. 08/31/18  Yes Si Gaul, MD  pantoprazole (PROTONIX) 40 MG tablet Take 1 tablet (40 mg total) by mouth daily. 10/11/18  Yes Glade Lloyd, MD  potassium chloride 20 MEQ TBCR Take 10 mEq by mouth daily. 11/13/18  Yes Si Gaul, MD  prazosin (MINIPRESS) 1 MG capsule Take 1 capsule (1 mg total) by mouth at bedtime. For nightmares 07/25/18  Yes [name], MD  ranolazine (RANEXA) 1000 MG SR tablet Take 1 tablet (1,000 mg total) by mouth 2 (two) times daily. 05/24/17  Yes Hilty, Lisette Abu, MD  rosuvastatin (CRESTOR) 20 MG tablet Take 1 tablet (20 mg total) by mouth daily. 07/26/18  Yes [name], MD  sucralfate (CARAFATE) 1 g tablet Take 1 tablet by mouth 4 (four) times daily -  before meals and at bedtime. 09/23/18  Yes [provider]  tiotropium (SPIRIVA HANDIHALER) 18 MCG inhalation capsule Place 1 capsule (18 mcg total) into inhaler and inhale daily. 04/20/18  Yes [name], MD  vitamin B-12 (CYANOCOBALAMIN) 500 MCG tablet Take 1 tablet (500 mcg total) by mouth daily. 06/30/18  Yes Marcello Fennel, MD  zolpidem (AMBIEN) 5 MG tablet Take 1 tablet (5 mg total) by mouth at bedtime as needed for sleep. Patient taking differently: Take 5 mg by mouth at bedtime.  09/14/18  Yes [name], MD  baclofen (LIORESAL) 10 MG tablet Take 1 tablet (10 mg total) by mouth 3 (three) times daily as needed for muscle spasms. 10/11/18   Glade Lloyd, MD  benzonatate (TESSALON) 100 MG capsule Take 1 capsule (100 mg total) by mouth every 8 (eight) hours. Patient taking differently: Take 100 mg by mouth 3 (three) times daily as needed for cough.  09/19/18   Si Gaul, MD  Blood Glucose Monitoring Suppl (ACCU-CHEK AVIVA PLUS) w/Device KIT  Use as dircted 07/12/17   [name], MD  clopidogrel (PLAVIX) 75 MG tablet Take 1 tablet (75 mg total) by mouth daily. YOU MAY RESTART 08/26/2018 08/25/18   Icard, Elige Radon L, DO  diclofenac sodium (VOLTAREN) 1 % GEL Apply 2 g topically 4 (four) times daily. Patient taking differently: Apply 2 g topically 4 (four) times daily as needed (pain).  04/20/18   [name], MD  dronabinol (MARINOL) 5 MG capsule Take 1 capsule (5 mg total) by mouth 2 (two) times daily before a meal. 10/18/18   Margaretmary Dys, MD  fluticasone [facility]) 50 MCG/ACT nasal spray Place 2 sprays into both nostrils daily as needed for allergies. 10/11/18   Glade Lloyd, MD  glucose blood (ACCU-CHEK AVIVA) test strip Use as instructed 07/12/17   [name], MD  hydrALAZINE (APRESOLINE) 25 MG tablet Take 1 tablet (25 mg total) by mouth 2 (two) times daily. 04/20/18   [name], MD  HYDROcodone-acetaminophen (NORCO) 5-325 MG tablet Take 1 tablet by mouth every 6 (six) hours as needed for moderate pain. 11/13/18   Si Gaul, MD  hydrocortisone-pramoxine [facility]) 2.5-1 % rectal cream Place 1 application rectally as needed. Patient taking differently: Place 1 application rectally as needed for hemorrhoids or anal itching.  10/11/18   Glade Lloyd, MD  isosorbide mononitrate (IMDUR) 120 MG 24 hr tablet Take 1 tablet (120 mg total) by mouth daily. 04/20/18   [name], MD  lidocaine-prilocaine (EMLA) cream Apply 1 application topically as needed. Apply 1 tsp on skin over port site one hour prior to chemotherapy. Cover with plastic wrap. Do not rub in medication. 11/27/18   Si Gaul, MD  LORazepam (ATIVAN) 0.5 MG tablet 1 tab po q 4-6 hours prn or 1 tab po 30 minutes prior to radiation 08/30/18   Ronny Bacon, PA-C  Nebivolol HCl 20 MG TABS Take 1 tablet (20 mg total) by mouth daily. 01/26/18   Hilty, Lisette Abu, MD  nitroGLYCERIN (NITROSTAT) 0.4 MG SL tablet Place 1 tablet (0.4 mg total)  under the tongue every 5 (five) minutes as needed for chest pain. Patient taking differently: Place 0.4 mg under the tongue every 5 (five) minutes as needed for chest pain. Chest pain 12/24/16   [place] Ouch, Lennart Pall, PA-C  ondansetron (ZOFRAN) 8 MG tablet Take 1 tablet (8 mg total) by mouth  every 8 (eight) hours as needed for nausea or vomiting. 09/20/18   Si Gaul, MD  prochlorperazine (COMPAZINE) 10 MG tablet Take 1 tablet (10 mg total) by mouth every 6 (six) hours as needed for nausea or vomiting. 10/23/18   Si Gaul, MD  promethazine (PHENERGAN) 25 MG tablet Take 1 tablet (25 mg total) by mouth every 6 (six) hours as needed for nausea or vomiting. 10/22/18   Melene Plan, DO    Physical Exam: Vitals:   12/01/18 1935 12/01/18 2040 12/01/18 2100 12/01/18 2130  BP: 119/90 120/83 116/79 119/90  Pulse: 91 95 84 82  Resp: 19 18 20 19   Temp:      TempSrc:      SpO2: 99% 99% 99% 100%  Weight:      Height:        Constitutional: NAD, calm, comfortable Eyes: PERRL, lids and conjunctivae normal ENMT: Mucous membranes are moist. Posterior pharynx clear of any exudate or lesions.Normal dentition.  Neck: normal, supple, no masses, no thyromegaly Respiratory: clear to auscultation bilaterally, no wheezing, no crackles. Normal respiratory effort. No accessory muscle use.  Cardiovascular: Regular rate and rhythm, no murmurs / rubs / gallops. No extremity edema. 2+ pedal pulses. No carotid bruits.  Abdomen: no tenderness, no masses palpated. No hepatosplenomegaly. Bowel sounds positive.  Musculoskeletal: no clubbing / cyanosis. No joint deformity upper and lower extremities. Good ROM, no contractures. Normal muscle tone.  Skin: no rashes, lesions, ulcers. No induration Neurologic: CN 2-12 grossly intact. Sensation intact, DTR normal. Strength 5/5 in all 4.  Psychiatric: Normal judgment and insight. Alert and oriented x 3. Normal mood.    Labs on Admission: I have personally reviewed  following labs and imaging studies  CBC: Recent Labs  Lab 11/27/18 0852 12/01/18 1918  WBC 3.0* 0.9*  NEUTROABS 2.8 0.7*  HGB 9.6* 9.1*  HCT 29.8* 28.4*  MCV 85.1 86.9  PLT 150 43*   Basic Metabolic Panel: Recent Labs  Lab 11/27/18 0852 12/01/18 1918  NA 142 142  K 3.6 3.5  CL 105 105  CO2 25 25  GLUCOSE 92 88  BUN 29* 32*  CREATININE 1.94* 1.70*  CALCIUM 9.0 8.7*  MG 1.4* 1.4*   GFR: Estimated Creatinine Clearance: 56.4 mL/min (A) (by C-G formula based on SCr of 1.7 mg/dL (H)). Liver Function Tests: Recent Labs  Lab 11/27/18 0852 12/01/18 1918  AST 14* 20  ALT 12 21  ALKPHOS 67 64  BILITOT 0.5 0.7  PROT 6.7 6.8  ALBUMIN 2.9* 3.1*   Recent Labs  Lab 12/01/18 1918  LIPASE 31   No results for input(s): AMMONIA in the last 168 hours. Coagulation Profile: No results for input(s): INR, PROTIME in the last 168 hours. Cardiac Enzymes: Recent Labs  Lab 12/01/18 1918  TROPONINI <0.03   BNP (last 3 results) No results for input(s): PROBNP in the last 8760 hours. HbA1C: No results for input(s): HGBA1C in the last 72 hours. CBG: No results for input(s): GLUCAP in the last 168 hours. Lipid Profile: No results for input(s): CHOL, HDL, LDLCALC, TRIG, CHOLHDL, LDLDIRECT in the last 72 hours. Thyroid Function Tests: No results for input(s): TSH, T4TOTAL, FREET4, T3FREE, THYROIDAB in the last 72 hours. Anemia Panel: No results for input(s): VITAMINB12, FOLATE, FERRITIN, TIBC, IRON, RETICCTPCT in the last 72 hours. Urine analysis:    Component Value Date/Time   COLORURINE AMBER (A) 11/05/2018 1025   APPEARANCEUR CLEAR 11/05/2018 1025   LABSPEC 1.014 11/05/2018 1025   PHURINE 5.0 11/05/2018  1025   GLUCOSEU NEGATIVE 11/05/2018 1025   GLUCOSEU NEGATIVE 12/30/2008 1531   HGBUR NEGATIVE 11/05/2018 1025   BILIRUBINUR NEGATIVE 11/05/2018 1025   BILIRUBINUR small 10/10/2017 1455   KETONESUR NEGATIVE 11/05/2018 1025   PROTEINUR 30 (A) 11/05/2018 1025    UROBILINOGEN 1.0 10/10/2017 1455   UROBILINOGEN 1.0 01/23/2014 2333   NITRITE NEGATIVE 11/05/2018 1025   LEUKOCYTESUR NEGATIVE 11/05/2018 1025    Radiological Exams on Admission: Ct Abdomen Pelvis Wo Contrast  Result Date: 12/01/2018 CLINICAL DATA:  Abdominal pain and nausea.  History of lung cancer. EXAM: CT ABDOMEN AND PELVIS WITHOUT CONTRAST TECHNIQUE: Multidetector CT imaging of the abdomen and pelvis was performed following the standard protocol without IV contrast. COMPARISON:  CT abdomen pelvis dated October 31, 2018. FINDINGS: Lower chest: Unchanged peripheral nodular consolidation in the right lower lobe. Hepatobiliary: No focal liver abnormality is seen. Small layering gallstones versus sludge. No gallbladder wall thickening or biliary dilatation. Pancreas: Unremarkable. No pancreatic ductal dilatation or surrounding inflammatory changes. Spleen: Normal in size without focal abnormality. Adrenals/Urinary Tract: Adrenal glands are unremarkable. Kidneys are normal, without renal calculi, focal lesion, or hydronephrosis. Bladder is unremarkable. Stomach/Bowel: Stomach is within normal limits. Appendix appears normal. No evidence of bowel wall thickening, distention, or inflammatory changes. Vascular/Lymphatic: Aortoiliac atherosclerosis. No enlarged abdominal or pelvic lymph nodes. Reproductive: Prostate is unremarkable. Other: Unchanged small fat containing umbilical and right inguinal hernias. No free fluid or pneumoperitoneum. Musculoskeletal: No acute or significant osseous findings. IMPRESSION: 1.  No acute intra-abdominal process. 2. Unchanged peripheral nodular consolidation in the right lower lobe. 3.  Aortic atherosclerosis (ICD10-I70.0). Electronically Signed   By: Obie Dredge M.D.   On: 12/01/2018 20:09   Dg Chest 2 View  Result Date: 12/01/2018 CLINICAL DATA:  Weakness.  History of lung cancer. EXAM: CHEST - 2 VIEW COMPARISON:  CT chest from same day. Chest x-ray dated November 02, 2018. FINDINGS: Unchanged left chest wall port catheter. The heart size and mediastinal contours are within normal limits. Normal pulmonary vascularity. Patchy, nodular consolidation in the right lower lobe, better evaluated on chest CT from same day. No pleural effusion or pneumothorax. No acute osseous abnormality. IMPRESSION: 1. Patchy, nodular consolidation in the right lower lobe, better evaluated on chest CT from same day. Electronically Signed   By: Obie Dredge M.D.   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 Restaging. EXAM: CT CHEST WITHOUT CONTRAST TECHNIQUE: Multidetector CT imaging of the chest was performed following the standard protocol without IV contrast. COMPARISON:  10/20/2018 chest CT. FINDINGS: Cardiovascular: Normal heart size. No significant pericardial effusion/thickening. Three-vessel coronary atherosclerosis. Left internal jugular Port-A-Cath terminates at the cavoatrial junction. Atherosclerotic nonaneurysmal thoracic aorta. Normal caliber pulmonary arteries. Mediastinum/Nodes: No discrete thyroid nodules. Unremarkable esophagus. No axillary adenopathy. Mildly enlarged 1.5 cm lower right paratracheal node (series 2/image 61), previously 1.5 cm using similar measurement technique, stable. Mildly enlarged 1.2 cm subcarinal node (series 2/image 70), stable. No new pathologically enlarged mediastinal nodes. No discrete hilar adenopathy on this noncontrast scan. Lungs/Pleura: No pneumothorax. No pleural effusion. Mild-to-moderate centrilobular and paraseptal emphysema with prominent diffuse bronchial wall thickening. Previously described 2.3 cm focus of consolidation in the anterior peripheral right lower lobe on 10/20/2018 chest CT study has resolved with small residual linear scar in this location. New patchy irregular  nodular foci of consolidation in the posterior right  lower lobe, largest 2.3 x 2.2 cm (series 5/image 110) with some central ground-glass density. No additional significant pulmonary nodules. Upper abdomen: No acute abnormality. Musculoskeletal: No aggressive appearing focal osseous lesions. Mild thoracic spondylosis. IMPRESSION: 1. Right paratracheal and subcarinal lymphadenopathy is stable. No new or progressive thoracic adenopathy. 2. Waxing and waning irregular nodular foci of consolidation at the periphery of the right lung base. Differential considerations include cryptogenic organizing pneumonia and recurrent infectious/inflammatory pneumonia (including aspiration pneumonia), with metastatic spread considered less likely but not entirely excluded. Continued close chest CT follow-up advised. Aortic Atherosclerosis (ICD10-I70.0) and Emphysema (ICD10-J43.9). Electronically Signed   By: Delbert Phenix M.D.   On: 12/01/2018 11:36    EKG: Independently reviewed.  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 ? RLL PNA - 1. Given N/V, ANC 700, feel that its probably best to treat this as PNA for the moment. 2. Will put patient on Cefepime 3. No fever, but does have N/V 4. BCx ordered 2. AKI - likely pre-renal / dehydration from N/V - 1. IVF 2. Repeat CMP in AM 3. N/V / abd pain - 1. Supportive care 2. IVF 3. Repeat CMP in AM 4. Zofran, phenergan, compazine PRN nausea 4. Pancytopenia - 1. IP consult to oncology placed for AM 5. SCLC - IP consult to oncology as above 6. HTN - holding home BP meds for the moment 7. CAD - continue statin, ASA, plavix 8. Hypomagnesemia - replace mag  DVT prophylaxis: SCDs -  thrombocytopenia Code Status: Full Family Communication: Family at bedside Disposition Plan: Home after admit Consults called: IP consult to oncology placed for chemo patient Admission status: Place in obs   Hillary Bow. DO [facility] Pager [phone] Only works nights!  If 7AM-7PM, please contact the primary day team physician taking care of patient  [email or web address] Password [facility]  12/01/2018, 9:57 PM

## 2018-12-01 NOTE — ED Notes (Signed)
Patient transported to X-ray via stretcher 

## 2018-12-01 NOTE — ED Notes (Signed)
During orthostatic VS, pt reported feeling lightheaded. No other symptoms reported.

## 2018-12-01 NOTE — ED Notes (Signed)
ED TO INPATIENT HANDOFF REPORT  Name/Age/Gender Andrew West 53 y.o. male  Code Status    Code Status Orders  (From admission, onward)         Start     Ordered   12/01/18 2146  Full code  Continuous     12/01/18 2146        Code Status History    Date Active Date Inactive Code Status Order ID Comments User Context   11/01/2018 0224 11/08/2018 1235 Full Code [id number]  Bethena Roys, MD Inpatient   10/08/2018 1321 10/11/2018 1642 Full Code [id number]  Reubin Milan, MD ED   02/25/2018 0208 02/26/2018 1955 Full Code [id number]  Gwynne Edinger, MD Inpatient   11/29/2017 0207 11/29/2017 1759 Full Code [id number]  Phillips Grout, MD ED   05/10/2017 0015 05/10/2017 1653 Full Code [id number]  Regino Bellow, DO ED   12/16/2016 1052 12/17/2016 1439 Full Code [id number]  Leonie Man, MD Inpatient   03/22/2016 1940 03/23/2016 1817 Full Code [id number]  Erlene Quan, PA-C ED   09/25/2015 1209 09/26/2015 1621 Full Code [id number]  Leonie Man, MD Inpatient   03/31/2012 1104 04/01/2012 0035 Full Code [id number]  Myrtie Hawk, RN Inpatient   10/17/2011 1726 10/18/2011 1912 Full Code [id number]  Etta Quill, RN Inpatient    Advance Directive Documentation     Most Recent Value  Type of Advance Directive  Healthcare Power of Attorney  Pre-existing out of facility DNR order (yellow form or pink MOST form)  -  "MOST" Form in Place?  -      Home/SNF/Other Home  Chief Complaint Chemo card pt, Emesis with blood  Level of Care/Admitting Diagnosis ED Disposition    ED Disposition Condition [place]: [facility] [[id number]]  Level of Care: Med-Surg [16]  Diagnosis: N&V (nausea and vomiting) [242683]  Admitting Physician: Etta Quill [phone]  Attending Physician: Etta Quill [[id number]  PT Class (Do Not Modify): Observation [104]  PT Acc Code (Do Not Modify): Observation [10022]       Medical History Past Medical History:   Diagnosis Date  . Anxiety   . CAD (coronary artery disease)    a. s/p multiple PCIs with last cath 11/2016 with severe multivessel CAD, s/p PCTA to LCx but unable to pass stent  . Chronic leg pain    bilateral  . Chronic lower back pain   . COPD (chronic obstructive pulmonary disease) ([place])   . Depression   . GERD (gastroesophageal reflux disease)    Takes Dexilant  . HLD (hyperlipidemia)   . Hypertension   . Rhabdomyolysis    h/o, r/t statins  . Sleep apnea    "can't tolerate mask" (12/16/2016)  . Type II diabetes mellitus (HCC)     Allergies Allergies  Allergen Reactions  . Iohexol Anaphylaxis    PT. TO BE PREMEDICATED PRIOR TO IV CONTRAST PER DR Kris Hartmann /MMS//12/15/15Desc: PT BECAME SOB AND CHEST TIGHTNESS AFTER CONTRAST INJECTION.  [name],RT-RCT., Onset Date: [id number]     IV Location/Drains/Wounds Patient Lines/Drains/Airways Status   Active Line/Drains/Airways    Name:   Placement date:   Placement time:   Site:   Days:   Implanted Port 09/29/18 Left Chest   09/29/18    1527    Chest   63          Labs/Imaging Results for orders placed or performed during the hospital encounter of 12/01/18 (from  the past 48 hour(s))  Lipase, blood     Status: None   Collection Time: 12/01/18  7:18 PM  Result Value Ref Range   Lipase 31 11 - 51 U/L    Comment: Performed at [facility], [place] [address], [place], [place] [address]  Comprehensive metabolic panel     Status: Abnormal   Collection Time: 12/01/18  7:18 PM  Result Value Ref Range   Sodium 142 135 - 145 mmol/L   Potassium 3.5 3.5 - 5.1 mmol/L   Chloride 105 98 - 111 mmol/L   CO2 25 22 - 32 mmol/L   Glucose, Bld 88 70 - 99 mg/dL   BUN 32 (H) 6 - 20 mg/dL   Creatinine, Ser 1.70 (H) 0.61 - 1.24 mg/dL   Calcium 8.7 (L) 8.9 - 10.3 mg/dL   Total Protein 6.8 6.5 - 8.1 g/dL   Albumin 3.1 (L) 3.5 - 5.0 g/dL   AST 20 15 - 41 U/L   ALT 21 0 - 44 U/L   Alkaline Phosphatase 64 38 - 126 U/L   Total  Bilirubin 0.7 0.3 - 1.2 mg/dL   GFR calc non Af Amer 45 (L) >60 mL/min   GFR calc Af Amer 53 (L) >60 mL/min   Anion gap 12 5 - 15    Comment: Performed at [facility], [place] [address] [place], [place] [address]  CBC     Status: Abnormal   Collection Time: 12/01/18  7:18 PM  Result Value Ref Range   WBC 0.9 (LL) 4.0 - 10.5 K/uL    Comment: REPEATED TO VERIFY THIS CRITICAL RESULT HAS VERIFIED AND BEEN CALLED TO T,DOSTER BY AISHA MOHAMED ON 01 10 2020 AT 2045, AND HAS BEEN READ BACK.     RBC 3.27 (L) 4.22 - 5.81 MIL/uL   Hemoglobin 9.1 (L) 13.0 - 17.0 g/dL   HCT 28.4 (L) 39.0 - 52.0 %   MCV 86.9 80.0 - 100.0 fL   MCH 27.8 26.0 - 34.0 pg   MCHC 32.0 30.0 - 36.0 g/dL   RDW 15.5 11.5 - 15.5 %   Platelets 43 (L) 150 - 400 K/uL    Comment: REPEATED TO VERIFY PLATELET COUNT CONFIRMED BY SMEAR SPECIMEN CHECKED FOR CLOTS Immature Platelet Fraction may be clinically indicated, consider ordering this additional test [id number]    nRBC 0.0 0.0 - 0.2 %    Comment: Performed at [facility], [place] [address] [place], [place] [address]  Troponin I - Once     Status: None   Collection Time: 12/01/18  7:18 PM  Result Value Ref Range   Troponin I <0.03 <0.03 ng/mL    Comment: Performed at [facility], [place] [address], [place], [place] [address]  Magnesium     Status: Abnormal   Collection Time: 12/01/18  7:18 PM  Result Value Ref Range   Magnesium 1.4 (L) 1.7 - 2.4 mg/dL    Comment: Performed at [facility], [place] [address], [place], [place] [address]  Differential     Status: Abnormal   Collection Time: 12/01/18  7:18 PM  Result Value Ref Range   Neutrophils Relative % 77 %   Neutro Abs 0.7 (L) 1.7 - 7.7 K/uL   Lymphocytes Relative 14 %   Lymphs Abs 0.1 (L) 0.7 - 4.0 K/uL   Monocytes Relative 4 %   Monocytes Absolute 0.0 (L) 0.1 - 1.0 K/uL   Eosinophils Relative 2 %  Eosinophils Absolute 0.0 0.0 - 0.5 K/uL    Basophils Relative 0 %   Basophils Absolute 0.0 0.0 - 0.1 K/uL   Ovalocytes PRESENT     Comment: Performed at [facility], [place] [address] [place], [place] [address]   Ct Abdomen Pelvis Wo Contrast  Result Date: 12/01/2018 CLINICAL DATA:  Abdominal pain and nausea.  History of lung cancer. EXAM: CT ABDOMEN AND PELVIS WITHOUT CONTRAST TECHNIQUE: Multidetector CT imaging of the abdomen and pelvis was performed following the standard protocol without IV contrast. COMPARISON:  CT abdomen pelvis dated October 31, 2018. FINDINGS: Lower chest: Unchanged peripheral nodular consolidation in the right lower lobe. Hepatobiliary: No focal liver abnormality is seen. Small layering gallstones versus sludge. No gallbladder wall thickening or biliary dilatation. Pancreas: Unremarkable. No pancreatic ductal dilatation or surrounding inflammatory changes. Spleen: Normal in size without focal abnormality. Adrenals/Urinary Tract: Adrenal glands are unremarkable. Kidneys are normal, without renal calculi, focal lesion, or hydronephrosis. Bladder is unremarkable. Stomach/Bowel: Stomach is within normal limits. Appendix appears normal. No evidence of bowel wall thickening, distention, or inflammatory changes. Vascular/Lymphatic: Aortoiliac atherosclerosis. No enlarged abdominal or pelvic lymph nodes. Reproductive: Prostate is unremarkable. Other: Unchanged small fat containing umbilical and right inguinal hernias. No free fluid or pneumoperitoneum. Musculoskeletal: No acute or significant osseous findings. IMPRESSION: 1.  No acute intra-abdominal process. 2. Unchanged peripheral nodular consolidation in the right lower lobe. 3.  Aortic atherosclerosis (ICD10-I70.0). Electronically Signed   By: Titus Dubin M.D.   On: 12/01/2018 20:09   Dg Chest 2 View  Result Date: 12/01/2018 CLINICAL DATA:  Weakness.  History of lung cancer. EXAM: CHEST - 2 VIEW COMPARISON:  CT chest from same day. Chest x-ray dated  November 02, 2018. FINDINGS: Unchanged left chest wall port catheter. The heart size and mediastinal contours are within normal limits. Normal pulmonary vascularity. Patchy, nodular consolidation in the right lower lobe, better evaluated on chest CT from same day. No pleural effusion or pneumothorax. No acute osseous abnormality. IMPRESSION: 1. Patchy, nodular consolidation in the right lower lobe, better evaluated on chest CT from same day. Electronically Signed   By: Titus Dubin M.D.   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 Restaging. EXAM: CT CHEST WITHOUT CONTRAST TECHNIQUE: Multidetector CT imaging of the chest was performed following the standard protocol without IV contrast. COMPARISON:  10/20/2018 chest CT. FINDINGS: Cardiovascular: Normal heart size. No significant pericardial effusion/thickening. Three-vessel coronary atherosclerosis. Left internal jugular Port-A-Cath terminates at the cavoatrial junction. Atherosclerotic nonaneurysmal thoracic aorta. Normal caliber pulmonary arteries. Mediastinum/Nodes: No discrete thyroid nodules. Unremarkable esophagus. No axillary adenopathy. Mildly enlarged 1.5 cm lower right paratracheal node (series 2/image 61), previously 1.5 cm using similar measurement technique, stable. Mildly enlarged 1.2 cm subcarinal node (series 2/image 70), stable. No new pathologically enlarged mediastinal nodes. No discrete hilar adenopathy on this noncontrast scan. Lungs/Pleura: No pneumothorax. No pleural effusion. Mild-to-moderate centrilobular and paraseptal emphysema with prominent diffuse bronchial wall thickening. Previously described 2.3 cm focus of consolidation in the anterior peripheral right lower lobe on 10/20/2018 chest CT study has resolved with small residual linear scar in this location. New patchy  irregular nodular foci of consolidation in the posterior right lower lobe, largest 2.3 x 2.2 cm (series 5/image 110) with some central ground-glass density. No additional significant pulmonary nodules. Upper abdomen: No acute abnormality. Musculoskeletal: No aggressive appearing  focal osseous lesions. Mild thoracic spondylosis. IMPRESSION: 1. Right paratracheal and subcarinal lymphadenopathy is stable. No new or progressive thoracic adenopathy. 2. Waxing and waning irregular nodular foci of consolidation at the periphery of the right lung base. Differential considerations include cryptogenic organizing pneumonia and recurrent infectious/inflammatory pneumonia (including aspiration pneumonia), with metastatic spread considered less likely but not entirely excluded. Continued close chest CT follow-up advised. Aortic Atherosclerosis (ICD10-I70.0) and Emphysema (ICD10-J43.9). Electronically Signed   By: Ilona Sorrel M.D.   On: 12/01/2018 11:36    Pending Labs Unresulted Labs (From admission, onward)    Start     Ordered   12/02/18 0500  CBC  Tomorrow morning,   R     12/01/18 2146   12/02/18 0500  Comprehensive metabolic panel  Tomorrow morning,   R     12/01/18 2146   12/01/18 2133  Culture, blood (routine x 2)  BLOOD CULTURE X 2,   STAT     12/01/18 2132   12/01/18 1914  Urine culture  ONCE - STAT,   STAT     12/01/18 1915   12/01/18 1838  Urinalysis, Routine w reflex microscopic  ONCE - STAT,   STAT     12/01/18 1838          Vitals/Pain Today's Vitals   12/01/18 2040 12/01/18 2100 12/01/18 2130 12/01/18 2200  BP:  116/79 119/90 118/88  Pulse:  84 82 77  Resp:  20 19 18   Temp:      TempSrc:      SpO2:  99% 100% 100%  Weight:      Height:      PainSc: 8        Isolation Precautions No active isolations  Medications Medications  0.9 %  sodium chloride infusion ( Intravenous Rate/Dose Change 12/01/18 2159)  ondansetron (ZOFRAN) injection 4 mg (4 mg Intravenous Given 12/01/18 1959)   sodium chloride 0.9 % bolus 500 mL (500 mLs Intravenous Bolus from Bag 12/01/18 2143)  piperacillin-tazobactam (ZOSYN) IVPB 3.375 g (3.375 g Intravenous New Bag/Given 12/01/18 2156)  morphine 2 MG/ML injection 2-4 mg (has no administration in time range)  acetaminophen (TYLENOL) tablet 650 mg (has no administration in time range)    Or  acetaminophen (TYLENOL) suppository 650 mg (has no administration in time range)  ondansetron (ZOFRAN) tablet 4 mg (has no administration in time range)    Or  ondansetron (ZOFRAN) injection 4 mg (has no administration in time range)  zolpidem (AMBIEN) tablet 5 mg (has no administration in time range)  tiotropium (SPIRIVA) inhalation capsule ([facility] use ONLY) 18 mcg (18 mcg Inhalation Not Given 12/01/18 2222)  vitamin B-12 (CYANOCOBALAMIN) tablet 500 mcg (has no administration in time range)  sucralfate (CARAFATE) tablet 1 g (has no administration in time range)  rosuvastatin (CRESTOR) tablet 20 mg (has no administration in time range)  ranolazine (RANEXA) 12 hr tablet 1,000 mg (has no administration in time range)  prazosin (MINIPRESS) capsule 1 mg (has no administration in time range)  prochlorperazine (COMPAZINE) tablet 10 mg (has no administration in time range)  promethazine (PHENERGAN) tablet 25 mg (has no administration in time range)  pantoprazole (PROTONIX) EC tablet 40 mg (has no administration in time range)  clopidogrel (PLAVIX) tablet 75 mg (has no administration in time range)  aspirin tablet 81 mg (has no administration in time range)  albuterol (PROVENTIL HFA;VENTOLIN HFA) 108 (90 Base) MCG/ACT inhaler 1 puff (has no administration in time range)  HYDROcodone-acetaminophen (NORCO/VICODIN) 5-325 MG per tablet  1 tablet (has no administration in time range)  benzonatate (TESSALON) capsule 100 mg (has no administration in time range)  Vitamin D 2,000 Units (has no administration in time range)  dronabinol (MARINOL) capsule 5 mg (has no administration  in time range)  mometasone-formoterol (DULERA) 100-5 MCG/ACT inhaler 2 puff (2 puffs Inhalation Not Given 12/01/18 2221)  mirtazapine (REMERON) tablet 30 mg (has no administration in time range)  FLUoxetine (PROZAC) capsule 40 mg (has no administration in time range)  ceFEPIme (MAXIPIME) 2 g in sodium chloride 0.9 % 100 mL IVPB (has no administration in time range)  magnesium sulfate IVPB 2 g 50 mL (0 g Intravenous Stopped 12/01/18 2143)  potassium chloride SA (K-DUR,KLOR-CON) CR tablet 40 mEq (40 mEq Oral Given 12/01/18 2040)    Mobility non-ambulatory

## 2018-12-01 NOTE — ED Notes (Signed)
Able to obtain one set of blood cultures from port, unable to obtain second set and did not want to delay abx.

## 2018-12-01 NOTE — ED Notes (Signed)
Report given to Tommie Raymond, Therapist, sports.

## 2018-12-01 NOTE — ED Triage Notes (Signed)
Pt states has had yellow emesis x3 month . Pt states he has stage 3 lung CA. Pt states the has not eaten anything in approximately 3 months. Pt states he is weak.  Pt states last chemo treatment 2 weeks ago. Pt states he has completed radiation.

## 2018-12-01 NOTE — ED Notes (Signed)
Pt is aware a urine sample is needed. Urinal at bedside.

## 2018-12-01 NOTE — ED Notes (Signed)
Attempted to call report to Tommie Raymond, will try again.

## 2018-12-01 NOTE — ED Notes (Signed)
Bladder scan reported 73 mL of urine in bladder.

## 2018-12-01 NOTE — ED Provider Notes (Signed)
[facility] DEPT Provider Note   CSN: [id number] Arrival date & time: 12/01/18  [place]     History   Chief Complaint Chief Complaint  Patient presents with  . Weakness  . Emesis    HPI Andrew West is a 53 y.o. male.   Weakness  Associated symptoms: vomiting   Emesis    Pt was seen at 1910. Per pt and his family, c/o gradual onset and persistence of multiple intermittent episodes of N/V for the past several days. Has been associated with generalized weakness, as well as generalized abd "pains" and poor PO intake.  Hx lung CA with LD chemo 1 week ago. Denies back pain, no diarrhea, no CP/SOB, no fevers, no rash, no focal motor weakness, no black or blood in stools or emesis.   Past Medical History:  Diagnosis Date  . Anxiety   . CAD (coronary artery disease)    a. s/p multiple PCIs with last cath 11/2016 with severe multivessel CAD, s/p PCTA to LCx but unable to pass stent  . Chronic leg pain    bilateral  . Chronic lower back pain   . COPD (chronic obstructive pulmonary disease) ([place])   . Depression   . GERD (gastroesophageal reflux disease)    Takes Dexilant  . HLD (hyperlipidemia)   . Hypertension   . Rhabdomyolysis    h/o, r/t statins  . Sleep apnea    "can't tolerate mask" (12/16/2016)  . Type II diabetes mellitus [facility])     Patient Active Problem List   Diagnosis Date Noted  . N&V (nausea and vomiting) 12/01/2018  . Right lower lobe pneumonia ([place]) 12/01/2018  . Antineoplastic chemotherapy induced pancytopenia ([place]) 12/01/2018  . Odynophagia   . AKI (acute kidney injury) ([place]) 10/08/2018  . Dysphagia 10/08/2018  . Hypokalemia 10/08/2018  . Hypomagnesemia 10/08/2018  . Type 2 diabetes mellitus ([place]) 10/08/2018  . Anemia 10/08/2018  . Sleep apnea 10/08/2018  . 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 Goals of care, counseling/discussion 08/31/2018  . Encounter for antineoplastic chemotherapy  08/31/2018  . Encounter for smoking cessation counseling 08/31/2018  . Malignant neoplasm of bronchus of right upper lobe ([place]) 08/29/2018  . Mediastinal mass 08/17/2018  . Vitamin D deficiency 08/04/2018  . Diabetic peripheral neuropathy ([place]) 06/30/2018  . Substernal chest pain 02/25/2018  . Benign prostatic hyperplasia with urinary hesitancy 11/17/2017  . Preoperative cardiovascular examination 07/12/2017  . Refractory angina ([place]) 05/24/2017  . Complex regional pain syndrome type I 03/24/2017  . Progressive angina ([place]) -Class III 12/16/2016  . Anxiety 06/28/2016  . Diastolic dysfunction-grade 2 with EF 60-65% Oct 2016 03/22/2016  . Hypertension 10/03/2015  . CAD S/P multiple PCI's 10/03/2015  . Pulmonary nodule 06/24/2015  . Cough 06/24/2015  . COPD with asthma ([place]) 06/24/2015  . Reactive airway disease 06/04/2015  . DOE (dyspnea on exertion) 04/15/2015  . Lumbar disc herniation with radiculopathy 03/31/2012  . Presence of stent in left circumflex coronary artery 10/18/2011    Class: History of  . TOBACCO ABUSE 02/27/2009  . ABDOMINAL PAIN, LEFT LOWER QUADRANT 12/30/2008  . ABDOMINAL PAIN, EPIGASTRIC 12/05/2008  . Anxiety and depression 09/05/2008  . Hereditary and idiopathic peripheral neuropathy 09/05/2008  . GERD 09/05/2008  . Cervical disc disorder with radiculopathy of cervical region 08/19/2008  . HLD (hyperlipidemia) 04/25/2008    Class: Diagnosis of  . ALLERGIC RHINITIS 04/25/2008  . Backache 04/25/2008  . Chest pain, unspecified 04/25/2008  .  COLONIC POLYPS, HX OF 04/25/2008  . Obesity 04/18/2008    Class: Diagnosis of  . ANAL FISSURE, HX OF 04/18/2008  . Diabetes mellitus ([place]) 01/30/2008    Class: History of  . RECTAL BLEEDING 01/30/2008    Past Surgical History:  Procedure Laterality Date  . BACK SURGERY    . BRONCHIAL BIOPSY  08/25/2018   Procedure: BRONCHIAL BIOPSIES;  Surgeon: Garner Nash, DO;  Location: [facility];  Service:  Cardiopulmonary;;  . CARDIAC CATHETERIZATION N/A 09/25/2015   Procedure: Left Heart Cath and Coronary Angiography;  Surgeon: Leonie Man, MD;  Location: [facility] CV LAB;  Service: Cardiovascular;  Laterality: N/A;  . CARDIAC CATHETERIZATION N/A 12/16/2016   Procedure: Left Heart Cath and Coronary Angiography;  Surgeon: Leonie Man, MD;  Location: [facility] CV LAB;  Service: Cardiovascular;  Laterality: N/A;  . CARDIAC CATHETERIZATION N/A 12/16/2016   Procedure: Coronary Balloon Angioplasty;  Surgeon: Leonie Man, MD;  Location: [facility] CV LAB;  Service: Cardiovascular;  Laterality: N/A;  . COLONOSCOPY W/ POLYPECTOMY    . CORONARY ANGIOPLASTY  09/25/2015   mid cir & om  . CORONARY ANGIOPLASTY WITH STENT PLACEMENT  10/09/2001   PTCA & stenting of mid AV circumflex; 2.5x41m Pixel stent  . CORONARY ANGIOPLASTY WITH STENT PLACEMENT  12/13/2001   PCI with stent to mid L circumflex, 95% stenosis to 0% residual  . CORONARY ANGIOPLASTY WITH STENT PLACEMENT  10/10/2003   PCI to mid AV circumflex; LAD 30% disease; RCA 100% occluded prox.  . CORONARY ANGIOPLASTY WITH STENT PLACEMENT  09/01/2011   PCI with stenting with bare metal stent to mid AV groove circumflex and PDA  . CORONARY ANGIOPLASTY WITH STENT PLACEMENT  10/17/2011   cutting balloon angioplasty of ostial lateral OM1 branch and bifurcation AV groove circumflex OM junction; stenosis reduced to 0%  . ENDOBRONCHIAL ULTRASOUND Bilateral 08/25/2018   Procedure: ENDOBRONCHIAL ULTRASOUND;  Surgeon: IGarner Nash DO;  Location: [facility];  Service: Cardiopulmonary;  Laterality: Bilateral;  . EXCISIONAL HEMORRHOIDECTOMY    . FINE NEEDLE ASPIRATION  08/25/2018   Procedure: FINE NEEDLE ASPIRATION;  Surgeon: IGarner Nash DO;  Location: [facility];  Service: Cardiopulmonary;;  . FLEXIBLE BRONCHOSCOPY  08/25/2018   Procedure: FLEXIBLE BRONCHOSCOPY;  Surgeon: IGarner Nash DO;  Location: [facility];  Service:  Cardiopulmonary;;  . IR IMAGING GUIDED PORT INSERTION  09/29/2018  . LEFT HEART CATHETERIZATION WITH CORONARY ANGIOGRAM N/A 10/18/2011   Procedure: LEFT HEART CATHETERIZATION WITH CORONARY ANGIOGRAM;  Surgeon: DLeonie Man MD;  [facility];  Service: Cardiovascular;  Laterality: N/A;  . LUMBAR LAMINECTOMY/DECOMPRESSION MICRODISCECTOMY  03/31/2012   Procedure: LUMBAR LAMINECTOMY/DECOMPRESSION MICRODISCECTOMY 1 LEVEL;  Surgeon: HCharlie Pitter MD;  [facility];  Service: Neurosurgery;  Laterality: Left;  . TRANSTHORACIC ECHOCARDIOGRAM  07/28/2011   EF 55-65%; LVH, grade 1 diastolic dysfunction;         Home Medications    Prior to Admission medications   Medication Sig Start Date End Date Taking? Authorizing Provider  albuterol (PROAIR HFA) 108 (90 Base) MCG/ACT inhaler Inhale 1 puff into the lungs every 6 (six) hours as needed for wheezing or shortness of breath. 08/29/18  Yes NCharlott Rakes MD  amLODipine (NORVASC) 10 MG tablet Take 1 tablet (10 mg total) by mouth daily. 04/20/18  Yes NCharlott Rakes MD  aspirin 81 MG tablet Take 1 tablet (81 mg total) daily by mouth. 10/10/17  Yes NCharlott Rakes MD  Cholecalciferol (VITAMIN D) 2000  units tablet Take 1 tablet (2,000 Units total) by mouth daily. 08/04/18  Yes Jamse Arn, MD  FLUoxetine (PROZAC) 40 MG capsule Take 1 capsule (40 mg total) by mouth daily. 04/20/18  Yes Newlin, Charlane Ferretti, MD  Fluticasone-Salmeterol (ADVAIR) 100-50 MCG/DOSE AEPB Inhale 1 puff into the lungs 2 (two) times daily. 07/25/18  Yes [name], MD  mirtazapine (REMERON) 30 MG tablet Take 1 tablet (30 mg total) by mouth at bedtime. 08/31/18  Yes Curt Bears, MD  nicotine (NICODERM CQ) 21 mg/24hr patch Place 1 patch (21 mg total) onto the skin daily. 08/31/18  Yes Curt Bears, MD  pantoprazole (PROTONIX) 40 MG tablet Take 1 tablet (40 mg total) by mouth daily. 10/11/18  Yes Aline August, MD  potassium chloride 20 MEQ TBCR Take 10 mEq  by mouth daily. 11/13/18  Yes Curt Bears, MD  prazosin (MINIPRESS) 1 MG capsule Take 1 capsule (1 mg total) by mouth at bedtime. For nightmares 07/25/18  Yes [name], MD  ranolazine (RANEXA) 1000 MG SR tablet Take 1 tablet (1,000 mg total) by mouth 2 (two) times daily. 05/24/17  Yes Hilty, Nadean Corwin, MD  rosuvastatin (CRESTOR) 20 MG tablet Take 1 tablet (20 mg total) by mouth daily. 07/26/18  Yes [name], MD  sucralfate (CARAFATE) 1 g tablet Take 1 tablet by mouth 4 (four) times daily -  before meals and at bedtime. 09/23/18  Yes [provider]  tiotropium (SPIRIVA HANDIHALER) 18 MCG inhalation capsule Place 1 capsule (18 mcg total) into inhaler and inhale daily. 04/20/18  Yes [name], MD  vitamin B-12 (CYANOCOBALAMIN) 500 MCG tablet Take 1 tablet (500 mcg total) by mouth daily. 06/30/18  Yes Jamse Arn, MD  zolpidem (AMBIEN) 5 MG tablet Take 1 tablet (5 mg total) by mouth at bedtime as needed for sleep. Patient taking differently: Take 5 mg by mouth at bedtime.  09/14/18  Yes [name], MD  baclofen (LIORESAL) 10 MG tablet Take 1 tablet (10 mg total) by mouth 3 (three) times daily as needed for muscle spasms. 10/11/18   Aline August, MD  benzonatate (TESSALON) 100 MG capsule Take 1 capsule (100 mg total) by mouth every 8 (eight) hours. Patient taking differently: Take 100 mg by mouth 3 (three) times daily as needed for cough.  09/19/18   Curt Bears, MD  Blood Glucose Monitoring Suppl (ACCU-CHEK AVIVA PLUS) w/Device KIT Use as dircted 07/12/17   [name], MD  clopidogrel (PLAVIX) 75 MG tablet Take 1 tablet (75 mg total) by mouth daily. YOU MAY RESTART 08/26/2018 08/25/18   Icard, Leory Plowman L, DO  diclofenac sodium (VOLTAREN) 1 % GEL Apply 2 g topically 4 (four) times daily. Patient taking differently: Apply 2 g topically 4 (four) times daily as needed (pain).  04/20/18   [name], MD  dronabinol (MARINOL) 5 MG capsule Take 1 capsule (5 mg  total) by mouth 2 (two) times daily before a meal. 10/18/18   Tyler Pita, MD  fluticasone [facility]) 50 MCG/ACT nasal spray Place 2 sprays into both nostrils daily as needed for allergies. 10/11/18   Aline August, MD  glucose blood (ACCU-CHEK AVIVA) test strip Use as instructed 07/12/17   [name], MD  hydrALAZINE (APRESOLINE) 25 MG tablet Take 1 tablet (25 mg total) by mouth 2 (two) times daily. 04/20/18   [name], MD  HYDROcodone-acetaminophen (NORCO) 5-325 MG tablet Take 1 tablet by mouth every 6 (six) hours as needed for moderate pain. 11/13/18   Curt Bears, MD  hydrocortisone-pramoxine (ANALPRAM-HC) 2.5-1 % rectal cream Place 1 application rectally as needed. Patient taking differently: Place 1 application rectally as needed for hemorrhoids or anal itching.  10/11/18   Aline August, MD  isosorbide mononitrate (IMDUR) 120 MG 24 hr tablet Take 1 tablet (120 mg total) by mouth daily. 04/20/18   [name], MD  lidocaine-prilocaine (EMLA) cream Apply 1 application topically as needed. Apply 1 tsp on skin over port site one hour prior to chemotherapy. Cover with plastic wrap. Do not rub in medication. 11/27/18   Curt Bears, MD  LORazepam (ATIVAN) 0.5 MG tablet 1 tab po q 4-6 hours prn or 1 tab po 30 minutes prior to radiation 08/30/18   Hayden Pedro, PA-C  methylPREDNISolone (MEDROL DOSEPAK) 4 MG TBPK tablet Use as instructed Patient not taking: Reported on 12/01/2018 11/13/18   Curt Bears, MD  Nebivolol HCl 20 MG TABS Take 1 tablet (20 mg total) by mouth daily. 01/26/18   Hilty, Nadean Corwin, MD  nitroGLYCERIN (NITROSTAT) 0.4 MG SL tablet Place 1 tablet (0.4 mg total) under the tongue every 5 (five) minutes as needed for chest pain. Patient taking differently: Place 0.4 mg under the tongue every 5 (five) minutes as needed for chest pain. Chest pain 12/24/16   Ahmed Prima, Fransisco Hertz, PA-C  ondansetron (ZOFRAN) 8 MG tablet Take 1 tablet (8 mg total) by mouth  every 8 (eight) hours as needed for nausea or vomiting. 09/20/18   Curt Bears, MD  prochlorperazine (COMPAZINE) 10 MG tablet Take 1 tablet (10 mg total) by mouth every 6 (six) hours as needed for nausea or vomiting. 10/23/18   Curt Bears, MD  promethazine (PHENERGAN) 25 MG tablet Take 1 tablet (25 mg total) by mouth every 6 (six) hours as needed for nausea or vomiting. 10/22/18   Deno Etienne, DO    Family History Family History  Problem Relation Age of Onset  . Heart attack Father   . Hypertension Mother   . Diabetes Mother   . Heart disease Brother        x 3   . Heart attack Brother        deceased  . Hypertension Sister   . Diabetes Sister   . Anesthesia problems Neg Hx   . Hypotension Neg Hx   . Malignant hyperthermia Neg Hx   . Pseudochol deficiency Neg Hx     Social History Social History   Tobacco Use  . Smoking status: Former Smoker    Packs/day: 0.25    Years: 25.00    Pack years: 6.25    Types: Cigarettes    Last attempt to quit: 05/24/2015    Years since quitting: 3.5  . Smokeless tobacco: Never Used  Substance Use Topics  . Alcohol use: No    Alcohol/week: 0.0 standard drinks  . Drug use: No     Allergies   Iohexol   Review of Systems Review of Systems  Gastrointestinal: Positive for vomiting.  Neurological: Positive for weakness.  ROS: Statement: All systems negative except as marked or noted in the HPI; Constitutional: Negative for fever and chills. +generalized weakness.; ; Eyes: Negative for eye pain, redness and discharge. ; ; ENMT: Negative for ear pain, hoarseness, nasal congestion, sinus pressure and sore throat. ; ; Cardiovascular: Negative for chest pain, palpitations, diaphoresis, dyspnea and peripheral edema. ; ; Respiratory: Negative for cough, wheezing and stridor. ; ; Gastrointestinal: +N/V, abd pain. Negative for diarrhea, blood in stool, hematemesis, jaundice and rectal bleeding. . ; ;  Genitourinary: Negative for dysuria, flank  pain and hematuria. ; ; Musculoskeletal: Negative for back pain and neck pain. Negative for swelling and trauma.; ; Skin: Negative for pruritus, rash, abrasions, blisters, bruising and skin lesion.; ; Neuro: Negative for headache, lightheadedness and neck stiffness. Negative for altered level of consciousness, altered mental status, extremity weakness, paresthesias, involuntary movement, seizure and syncope.        Physical Exam Updated Vital Signs BP 119/90   Pulse 82   Temp 98.4 F (36.9 C) (Oral)   Resp 19   Ht _0  (1.753 m)   Wt 90 kg   SpO2 100%   BMI 29.30 kg/m     20:51 Orthostatic Vital Signs JL  Orthostatic Lying   BP- Lying: 110/84  Pulse- Lying: 84      Orthostatic Sitting  BP- Sitting: 111/90  Pulse- Sitting: 90      Orthostatic Standing at 0 minutes  BP- Standing at 0 minutes: 114/92Abnormal   Pulse- Standing at 0 minutes: 111      Orthostatic Standing at 3 minutes  BP- Standing at 3 minutes: 118/106Abnormal   Pulse- Standing at 3 minutes: 109      Physical Exam 1915: Physical examination:  Nursing notes reviewed; Vital signs and O2 SAT reviewed;  Constitutional: Well developed, Well nourished, In no acute distress. Appears fatigued.; Head:  Normocephalic, atraumatic; Eyes: EOMI, PERRL, No scleral icterus; ENMT: Mouth and pharynx normal, Mucous membranes moist; Neck: Supple, Full range of motion, No lymphadenopathy; Cardiovascular: Regular rate and rhythm, No gallop; Respiratory: Breath sounds clear & equal bilaterally, No wheezes.  Speaking full sentences with ease, Normal respiratory effort/excursion; Chest: Nontender, Movement normal; Abdomen: Soft, +mild diffuse tenderness to palp. No rebound or guarding. Nondistended, Normal bowel sounds; Genitourinary: No CVA tenderness; Extremities: Peripheral pulses normal, No tenderness, No edema, No calf edema or asymmetry.; Neuro: AA&Ox3, Major CN grossly intact.  Speech clear. No gross focal motor or sensory  deficits in extremities.; Skin: Color normal, Warm, Dry.   ED Treatments / Results  Labs (all labs ordered are listed, but only abnormal results are displayed)   EKG EKG Interpretation  Date/Time:  Friday December 01 2018 19:37:20 EST Ventricular Rate:  90 PR Interval:    QRS Duration: 108 QT Interval:  381 QTC Calculation: 467 R Axis:   4 Text Interpretation:  Sinus rhythm Baseline wander Artifact When compared with ECG of 10/31/2018 Rate slower Confirmed by Francine Graven [id number]) on 12/01/2018 7:46:00 PM   Radiology   Procedures Procedures (including critical care time)  Medications Ordered in ED Medications  0.9 %  sodium chloride infusion ( Intravenous New Bag/Given 12/01/18 2001)  ondansetron (ZOFRAN) injection 4 mg (4 mg Intravenous Given 12/01/18 1959)  sodium chloride 0.9 % bolus 500 mL (500 mLs Intravenous Bolus from Bag 12/01/18 2143)  piperacillin-tazobactam (ZOSYN) IVPB 3.375 g (has no administration in time range)  morphine 2 MG/ML injection 2-4 mg (has no administration in time range)  acetaminophen (TYLENOL) tablet 650 mg (has no administration in time range)    Or  acetaminophen (TYLENOL) suppository 650 mg (has no administration in time range)  ondansetron (ZOFRAN) tablet 4 mg (has no administration in time range)    Or  ondansetron (ZOFRAN) injection 4 mg (has no administration in time range)  vancomycin (VANCOCIN) IVPB 1000 mg/200 mL premix (has no administration in time range)  magnesium sulfate IVPB 2 g 50 mL (0 g Intravenous Stopped 12/01/18 2143)  potassium chloride SA (K-DUR,KLOR-CON) CR tablet 40 mEq (  40 mEq Oral Given 12/01/18 2040)     Initial Impression / Assessment and Plan / ED Course  I have reviewed the triage vital signs and the nursing notes.  Pertinent labs & imaging results that were available during my care of the patient were reviewed by me and considered in my medical decision making (see chart for details).  MDM Reviewed:  previous chart, nursing note and vitals Reviewed previous: labs and ECG Interpretation: ECG, labs and x-ray    Results for orders placed or performed during the hospital encounter of 12/01/18  Lipase, blood  Result Value Ref Range   Lipase 31 11 - 51 U/L  Comprehensive metabolic panel  Result Value Ref Range   Sodium 142 135 - 145 mmol/L   Potassium 3.5 3.5 - 5.1 mmol/L   Chloride 105 98 - 111 mmol/L   CO2 25 22 - 32 mmol/L   Glucose, Bld 88 70 - 99 mg/dL   BUN 32 (H) 6 - 20 mg/dL   Creatinine, Ser 1.70 (H) 0.61 - 1.24 mg/dL   Calcium 8.7 (L) 8.9 - 10.3 mg/dL   Total Protein 6.8 6.5 - 8.1 g/dL   Albumin 3.1 (L) 3.5 - 5.0 g/dL   AST 20 15 - 41 U/L   ALT 21 0 - 44 U/L   Alkaline Phosphatase 64 38 - 126 U/L   Total Bilirubin 0.7 0.3 - 1.2 mg/dL   GFR calc non Af Amer 45 (L) >60 mL/min   GFR calc Af Amer 53 (L) >60 mL/min   Anion gap 12 5 - 15  CBC  Result Value Ref Range   WBC 0.9 (LL) 4.0 - 10.5 K/uL   RBC 3.27 (L) 4.22 - 5.81 MIL/uL   Hemoglobin 9.1 (L) 13.0 - 17.0 g/dL   HCT 28.4 (L) 39.0 - 52.0 %   MCV 86.9 80.0 - 100.0 fL   MCH 27.8 26.0 - 34.0 pg   MCHC 32.0 30.0 - 36.0 g/dL   RDW 15.5 11.5 - 15.5 %   Platelets 43 (L) 150 - 400 K/uL   nRBC 0.0 0.0 - 0.2 %  Troponin I - Once  Result Value Ref Range   Troponin I <0.03 <0.03 ng/mL  Magnesium  Result Value Ref Range   Magnesium 1.4 (L) 1.7 - 2.4 mg/dL  Differential  Result Value Ref Range   Neutrophils Relative % 77 %   Neutro Abs 0.7 (L) 1.7 - 7.7 K/uL   Lymphocytes Relative 14 %   Lymphs Abs 0.1 (L) 0.7 - 4.0 K/uL   Monocytes Relative 4 %   Monocytes Absolute 0.0 (L) 0.1 - 1.0 K/uL   Eosinophils Relative 2 %   Eosinophils Absolute 0.0 0.0 - 0.5 K/uL   Basophils Relative 0 %   Basophils Absolute 0.0 0.0 - 0.1 K/uL   Ovalocytes PRESENT    Ct Abdomen Pelvis Wo Contrast Result Date: 12/01/2018 CLINICAL DATA:  Abdominal pain and nausea.  History of lung cancer. EXAM: CT ABDOMEN AND PELVIS WITHOUT CONTRAST  TECHNIQUE: Multidetector CT imaging of the abdomen and pelvis was performed following the standard protocol without IV contrast. COMPARISON:  CT abdomen pelvis dated October 31, 2018. FINDINGS: Lower chest: Unchanged peripheral nodular consolidation in the right lower lobe. Hepatobiliary: No focal liver abnormality is seen. Small layering gallstones versus sludge. No gallbladder wall thickening or biliary dilatation. Pancreas: Unremarkable. No pancreatic ductal dilatation or surrounding inflammatory changes. Spleen: Normal in size without focal abnormality. Adrenals/Urinary Tract: Adrenal glands are unremarkable. Kidneys are  normal, without renal calculi, focal lesion, or hydronephrosis. Bladder is unremarkable. Stomach/Bowel: Stomach is within normal limits. Appendix appears normal. No evidence of bowel wall thickening, distention, or inflammatory changes. Vascular/Lymphatic: Aortoiliac atherosclerosis. No enlarged abdominal or pelvic lymph nodes. Reproductive: Prostate is unremarkable. Other: Unchanged small fat containing umbilical and right inguinal hernias. No free fluid or pneumoperitoneum. Musculoskeletal: No acute or significant osseous findings. IMPRESSION: 1.  No acute intra-abdominal process. 2. Unchanged peripheral nodular consolidation in the right lower lobe. 3.  Aortic atherosclerosis (ICD10-I70.0). Electronically Signed   By: Titus Dubin M.D.   On: 12/01/2018 20:09   Dg Chest 2 View Result Date: 12/01/2018 CLINICAL DATA:  Weakness.  History of lung cancer. EXAM: CHEST - 2 VIEW COMPARISON:  CT chest from same day. Chest x-ray dated November 02, 2018. FINDINGS: Unchanged left chest wall port catheter. The heart size and mediastinal contours are within normal limits. Normal pulmonary vascularity. Patchy, nodular consolidation in the right lower lobe, better evaluated on chest CT from same day. No pleural effusion or pneumothorax. No acute osseous abnormality. IMPRESSION: 1. Patchy, nodular  consolidation in the right lower lobe, better evaluated on chest CT from same day. Electronically Signed   By: Titus Dubin M.D.   On: 12/01/2018 20:04   Ct Chest Wo Contrast Result Date: 12/01/2018 CLINICAL DATA:  Limited stage right lung small cell carcinoma diagnosed September 2019 with radiation therapy and ongoing chemotherapy. Restaging. EXAM: CT CHEST WITHOUT CONTRAST TECHNIQUE: Multidetector CT imaging of the chest was performed following the standard protocol without IV contrast. COMPARISON:  10/20/2018 chest CT. FINDINGS: Cardiovascular: Normal heart size. No significant pericardial effusion/thickening. Three-vessel coronary atherosclerosis. Left internal jugular Port-A-Cath terminates at the cavoatrial junction. Atherosclerotic nonaneurysmal thoracic aorta. Normal caliber pulmonary arteries. Mediastinum/Nodes: No discrete thyroid nodules. Unremarkable esophagus. No axillary adenopathy. Mildly enlarged 1.5 cm lower right paratracheal node (series 2/image 61), previously 1.5 cm using similar measurement technique, stable. Mildly enlarged 1.2 cm subcarinal node (series 2/image 70), stable. No new pathologically enlarged mediastinal nodes. No discrete hilar adenopathy on this noncontrast scan. Lungs/Pleura: No pneumothorax. No pleural effusion. Mild-to-moderate centrilobular and paraseptal emphysema with prominent diffuse bronchial wall thickening. Previously described 2.3 cm focus of consolidation in the anterior peripheral right lower lobe on 10/20/2018 chest CT study has resolved with small residual linear scar in this location. New patchy irregular nodular foci of consolidation in the posterior right lower lobe, largest 2.3 x 2.2 cm (series 5/image 110) with some central ground-glass density. No additional significant pulmonary nodules. Upper abdomen: No acute abnormality. Musculoskeletal: No aggressive appearing focal osseous lesions. Mild thoracic spondylosis. IMPRESSION: 1. Right paratracheal and  subcarinal lymphadenopathy is stable. No new or progressive thoracic adenopathy. 2. Waxing and waning irregular nodular foci of consolidation at the periphery of the right lung base. Differential considerations include cryptogenic organizing pneumonia and recurrent infectious/inflammatory pneumonia (including aspiration pneumonia), with metastatic spread considered less likely but not entirely excluded. Continued close chest CT follow-up advised. Aortic Atherosclerosis (ICD10-I70.0) and Emphysema (ICD10-J43.9). Electronically Signed   By: Ilona Sorrel M.D.   On: 12/01/2018 11:36      2130:  Pt stated he felt lightheaded during orthostatic VS. BUN/Cr elevated from previous and bladder scan only 29m; judicious IVF given. Given neutropenia and possible aspiration pneumonia seen on CT chest earlier today; BC obtained and IV abx started. IV magnesium given. PO potassium attempted, but pt unable to tolerate PO. Will admit. T/C returned from [facility] GAlcario Drought case discussed, including:  HPI, pertinent PM/SHx, VS/PE,  dx testing, ED course and treatment:  Agreeable to admit.    Final Clinical Impressions(s) / ED Diagnoses   Final diagnoses:  None    ED Discharge Orders    None       Francine Graven, DO 12/03/18 1632

## 2018-12-02 ENCOUNTER — Other Ambulatory Visit: Payer: Self-pay

## 2018-12-02 DIAGNOSIS — C3411 Malignant neoplasm of upper lobe, right bronchus or lung: Secondary | ICD-10-CM | POA: Diagnosis not present

## 2018-12-02 DIAGNOSIS — G4730 Sleep apnea, unspecified: Secondary | ICD-10-CM | POA: Diagnosis present

## 2018-12-02 DIAGNOSIS — Z9861 Coronary angioplasty status: Secondary | ICD-10-CM | POA: Diagnosis not present

## 2018-12-02 DIAGNOSIS — R627 Adult failure to thrive: Secondary | ICD-10-CM | POA: Diagnosis present

## 2018-12-02 DIAGNOSIS — Z4659 Encounter for fitting and adjustment of other gastrointestinal appliance and device: Secondary | ICD-10-CM | POA: Diagnosis not present

## 2018-12-02 DIAGNOSIS — J440 Chronic obstructive pulmonary disease with acute lower respiratory infection: Secondary | ICD-10-CM | POA: Diagnosis not present

## 2018-12-02 DIAGNOSIS — F329 Major depressive disorder, single episode, unspecified: Secondary | ICD-10-CM | POA: Diagnosis not present

## 2018-12-02 DIAGNOSIS — E46 Unspecified protein-calorie malnutrition: Secondary | ICD-10-CM | POA: Diagnosis not present

## 2018-12-02 DIAGNOSIS — D6959 Other secondary thrombocytopenia: Secondary | ICD-10-CM | POA: Diagnosis present

## 2018-12-02 DIAGNOSIS — N183 Chronic kidney disease, stage 3 (moderate): Secondary | ICD-10-CM | POA: Diagnosis not present

## 2018-12-02 DIAGNOSIS — Z79899 Other long term (current) drug therapy: Secondary | ICD-10-CM | POA: Diagnosis not present

## 2018-12-02 DIAGNOSIS — G893 Neoplasm related pain (acute) (chronic): Secondary | ICD-10-CM | POA: Diagnosis present

## 2018-12-02 DIAGNOSIS — K210 Gastro-esophageal reflux disease with esophagitis: Secondary | ICD-10-CM | POA: Diagnosis not present

## 2018-12-02 DIAGNOSIS — N179 Acute kidney failure, unspecified: Secondary | ICD-10-CM | POA: Diagnosis not present

## 2018-12-02 DIAGNOSIS — I25118 Atherosclerotic heart disease of native coronary artery with other forms of angina pectoris: Secondary | ICD-10-CM | POA: Diagnosis not present

## 2018-12-02 DIAGNOSIS — I129 Hypertensive chronic kidney disease with stage 1 through stage 4 chronic kidney disease, or unspecified chronic kidney disease: Secondary | ICD-10-CM | POA: Diagnosis not present

## 2018-12-02 DIAGNOSIS — K219 Gastro-esophageal reflux disease without esophagitis: Secondary | ICD-10-CM | POA: Diagnosis present

## 2018-12-02 DIAGNOSIS — T451X5A Adverse effect of antineoplastic and immunosuppressive drugs, initial encounter: Secondary | ICD-10-CM | POA: Diagnosis not present

## 2018-12-02 DIAGNOSIS — I10 Essential (primary) hypertension: Secondary | ICD-10-CM | POA: Diagnosis not present

## 2018-12-02 DIAGNOSIS — I2510 Atherosclerotic heart disease of native coronary artery without angina pectoris: Secondary | ICD-10-CM | POA: Diagnosis not present

## 2018-12-02 DIAGNOSIS — R112 Nausea with vomiting, unspecified: Secondary | ICD-10-CM | POA: Diagnosis not present

## 2018-12-02 DIAGNOSIS — C3490 Malignant neoplasm of unspecified part of unspecified bronchus or lung: Secondary | ICD-10-CM | POA: Diagnosis not present

## 2018-12-02 DIAGNOSIS — K222 Esophageal obstruction: Secondary | ICD-10-CM | POA: Diagnosis not present

## 2018-12-02 DIAGNOSIS — K208 Other esophagitis: Secondary | ICD-10-CM | POA: Diagnosis not present

## 2018-12-02 DIAGNOSIS — J181 Lobar pneumonia, unspecified organism: Secondary | ICD-10-CM | POA: Diagnosis not present

## 2018-12-02 DIAGNOSIS — N182 Chronic kidney disease, stage 2 (mild): Secondary | ICD-10-CM | POA: Diagnosis not present

## 2018-12-02 DIAGNOSIS — E1122 Type 2 diabetes mellitus with diabetic chronic kidney disease: Secondary | ICD-10-CM | POA: Diagnosis present

## 2018-12-02 DIAGNOSIS — J1000 Influenza due to other identified influenza virus with unspecified type of pneumonia: Secondary | ICD-10-CM | POA: Diagnosis not present

## 2018-12-02 DIAGNOSIS — E860 Dehydration: Secondary | ICD-10-CM | POA: Diagnosis present

## 2018-12-02 DIAGNOSIS — T66XXXA Radiation sickness, unspecified, initial encounter: Secondary | ICD-10-CM | POA: Diagnosis not present

## 2018-12-02 DIAGNOSIS — Y842 Radiological procedure and radiotherapy as the cause of abnormal reaction of the patient, or of later complication, without mention of misadventure at the time of the procedure: Secondary | ICD-10-CM | POA: Diagnosis present

## 2018-12-02 DIAGNOSIS — E785 Hyperlipidemia, unspecified: Secondary | ICD-10-CM | POA: Diagnosis present

## 2018-12-02 DIAGNOSIS — E440 Moderate protein-calorie malnutrition: Secondary | ICD-10-CM | POA: Diagnosis not present

## 2018-12-02 DIAGNOSIS — R1310 Dysphagia, unspecified: Secondary | ICD-10-CM | POA: Diagnosis not present

## 2018-12-02 DIAGNOSIS — K209 Esophagitis, unspecified: Secondary | ICD-10-CM | POA: Diagnosis not present

## 2018-12-02 DIAGNOSIS — D61810 Antineoplastic chemotherapy induced pancytopenia: Secondary | ICD-10-CM | POA: Diagnosis not present

## 2018-12-02 LAB — PROCALCITONIN: Procalcitonin: <0.1 ng/mL

## 2018-12-02 LAB — COMPREHENSIVE METABOLIC PANEL
ALBUMIN: 2.7 g/dL — AB (ref 3.5–5.0)
ALT: 16 U/L (ref 0–44)
ANION GAP: 9 (ref 5–15)
AST: 16 U/L (ref 15–41)
Alkaline Phosphatase: 48 U/L (ref 38–126)
BUN: 28 mg/dL — ABNORMAL HIGH (ref 6–20)
CO2: 25 mmol/L (ref 22–32)
CREATININE: 1.57 mg/dL — AB (ref 0.61–1.24)
Calcium: 8.3 mg/dL — ABNORMAL LOW (ref 8.9–10.3)
Chloride: 108 mmol/L (ref 98–111)
GFR calc Af Amer: 58 mL/min — ABNORMAL LOW (ref >60)
GFR calc non Af Amer: 50 mL/min — ABNORMAL LOW (ref >60)
Glucose, Bld: 73 mg/dL (ref 70–99)
Potassium: 3.9 mmol/L (ref 3.5–5.1)
Sodium: 142 mmol/L (ref 135–145)
Total Bilirubin: 0.8 mg/dL (ref 0.3–1.2)
Total Protein: 5.6 g/dL — ABNORMAL LOW (ref 6.5–8.1)

## 2018-12-02 LAB — URINALYSIS, ROUTINE W REFLEX MICROSCOPIC
Bilirubin Urine: NEGATIVE
Glucose, UA: NEGATIVE mg/dL
HGB URINE DIPSTICK: NEGATIVE
Ketones, ur: 5 mg/dL — AB
Leukocytes, UA: NEGATIVE
Nitrite: NEGATIVE
Protein, ur: NEGATIVE mg/dL
Specific Gravity, Urine: 1.014 (ref 1.005–1.030)
pH: 5 (ref 5.0–8.0)

## 2018-12-02 LAB — CBC
HCT: 24 % — ABNORMAL LOW (ref 39.0–52.0)
Hemoglobin: 7.5 g/dL — ABNORMAL LOW (ref 13.0–17.0)
MCH: 28 pg (ref 26.0–34.0)
MCHC: 31.3 g/dL (ref 30.0–36.0)
MCV: 89.6 fL (ref 80.0–100.0)
Platelets: 31 10*3/uL — ABNORMAL LOW (ref 150–400)
RBC: 2.68 MIL/uL — ABNORMAL LOW (ref 4.22–5.81)
RDW: 15.8 % — ABNORMAL HIGH (ref 11.5–15.5)
WBC: 0.8 10*3/uL — CL (ref 4.0–10.5)
nRBC: 0 % (ref 0.0–0.2)

## 2018-12-02 MED ORDER — ONDANSETRON HCL 4 MG PO TABS
4.0000 mg | ORAL_TABLET | Freq: Four times a day (QID) | ORAL | Status: DC | PRN
  Administered 2018-12-02: 4 mg via ORAL
  Filled 2018-12-02: qty 1

## 2018-12-02 MED ORDER — HYDROMORPHONE HCL 1 MG/ML IJ SOLN
0.5000 mg | INTRAMUSCULAR | Status: DC | PRN
  Administered 2018-12-02 – 2018-12-03 (×5): 0.5 mg via INTRAVENOUS
  Filled 2018-12-02 (×5): qty 1

## 2018-12-02 MED ORDER — PROMETHAZINE HCL 25 MG PO TABS
25.0000 mg | ORAL_TABLET | Freq: Four times a day (QID) | ORAL | Status: DC | PRN
  Administered 2018-12-02: 25 mg via ORAL
  Filled 2018-12-02: qty 1

## 2018-12-02 MED ORDER — PROCHLORPERAZINE MALEATE 10 MG PO TABS
10.0000 mg | ORAL_TABLET | Freq: Four times a day (QID) | ORAL | Status: DC | PRN
  Administered 2018-12-04: 10 mg via ORAL
  Filled 2018-12-02: qty 1

## 2018-12-02 MED ORDER — PRUTECT EX EMUL
Freq: Two times a day (BID) | CUTANEOUS | Status: DC
  Administered 2018-12-02 (×2): via TOPICAL
  Administered 2018-12-03: 1 via TOPICAL
  Administered 2018-12-04 – 2018-12-06 (×4): via TOPICAL
  Administered 2018-12-06: 1 via TOPICAL
  Administered 2018-12-07 – 2018-12-10 (×5): via TOPICAL
  Filled 2018-12-02 (×3): qty 45

## 2018-12-02 MED ORDER — DIPHENHYDRAMINE HCL 25 MG PO CAPS
25.0000 mg | ORAL_CAPSULE | ORAL | Status: DC | PRN
  Administered 2018-12-02 – 2018-12-04 (×3): 25 mg via ORAL
  Filled 2018-12-02 (×3): qty 1

## 2018-12-02 MED ORDER — ONDANSETRON HCL 4 MG/2ML IJ SOLN
4.0000 mg | Freq: Four times a day (QID) | INTRAMUSCULAR | Status: DC | PRN
  Administered 2018-12-02 – 2018-12-11 (×23): 4 mg via INTRAVENOUS
  Filled 2018-12-02 (×24): qty 2

## 2018-12-02 NOTE — Progress Notes (Signed)
PROGRESS NOTE  [name]:[id number] DOB: 28-Jul-1966 DOA: 12/01/2018 PCP: [name], MD  HPI/Recap of past 24 hours:  Andrew West is a 53 y.o. male with medical history significant of SCLC chemo x8 days ago, HTN, CAD. Patient presents to the ED with c/o multiple episodes of N/V for past couple of days.  Associated generalized weakness.  Generalized abd pain.  Poor PO intake.  ED Course: CT today shows ? RLL PNA.  WBC 0.9k, HGB 9.1, platelets 43.  12/02/2018: Patient seen and examined at bedside.  Ill-appearing reports pain at periumbilical region tender on palpation.  Also reports nausea with vomiting last night.  Poor oral intake due to persistent nausea.    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 RLL PNA - 1. Given N/V, ANC 700, feel that its probably best to treat this as PNA for the moment. 2. Continue cefepime 3. No fever, but does have N/V 4. BCx x2- today 2. AKI on CKD 2.  Baseline creatinine appears to be 1.4 with GFR greater than 60.  Improving- likely pre-renal / dehydration from N/V - 1. IVF 2. Creatinine 1.57 from 1.70 3. N/V / abd pain - 1. Supportive care 2. IVF 3. Repeat CMP in AM 4. Zofran, phenergan, compazine PRN nausea 4. Pancytopenia - 1. Oncology consulted 5. SCLC - IP consult to oncology as above 6. HTN - holding home BP meds for the moment 7. CAD - continue statin, ASA, plavix 8. Hypomagnesemia - replace mag.  9. Cancer related pain.  Patient states he hurts all over.  On chronic pain medication at home.  Continue IV Dilaudid 0.5 mg every 4 hours as needed for severe pain.  Resume his home pain regimen.  DVT prophylaxis: SCDs - thrombocytopenia Code Status:  Full Family Communication: Family at bedside Disposition Plan: Home when hemodynamically stable or when oncology signs of. Consults called: IP consult to oncology placed for chemo patient   Objective: Vitals:   12/01/18 2234 12/02/18 0012 12/02/18 0615 12/02/18 1252  BP: 116/80 98/70 105/67 118/78  Pulse: 76 80 86 83  Resp: 12 20 18 16   Temp:  98.4 F (36.9 C) 98.6 F (37 C) 98.3 F (36.8 C)  TempSrc:  Oral Oral Oral  SpO2: 100% 97% 97% 96%  Weight:  83.7 kg    Height:  5\' 9"  (1.753 m)      Intake/Output Summary (Last 24 hours) at 12/02/2018 1506 Last data filed at 12/02/2018 1451 Gross per 24 hour  Intake 2308.56 ml  Output 500 ml  Net 1808.56 ml   Filed Weights   12/01/18 1836 12/02/18 0012  Weight: 90 kg 83.7 kg    Exam:  . General: 53 y.o. year-old male well developed well nourished.  Appears uncomfortable due to hurting all over.  Alert and oriented x3. . Cardiovascular: Regular rate and rhythm with no rubs or gallops.  No thyromegaly or JVD noted.   Marland Kitchen Respiratory: Clear to auscultation with no wheezes or rales. Good inspiratory effort. . Abdomen: Soft nontender nondistended with normal bowel sounds x4 quadrants. . Musculoskeletal: Trace lower extremity edema. 2/4 pulses in all 4 extremities. Marland Kitchen Psychiatry: Mood is appropriate for condition and setting  Data Reviewed: CBC: Recent Labs  Lab 11/27/18 0852 12/01/18 1918 12/02/18 0615  WBC 3.0* 0.9* 0.8*  NEUTROABS 2.8 0.7*  --   HGB 9.6* 9.1* 7.5*  HCT 29.8* 28.4* 24.0*  MCV 85.1 86.9 89.6  PLT 150 43* 31*   Basic Metabolic Panel: Recent Labs  Lab 11/27/18 0852 12/01/18 1918 12/02/18 0615  NA 142 142 142  K 3.6 3.5 3.9  CL 105 105 108  CO2 25 25 25   GLUCOSE 92 88 73  BUN 29* 32* 28*  CREATININE 1.94* 1.70* 1.57*  CALCIUM 9.0 8.7* 8.3*  MG 1.4* 1.4*  --    GFR: Estimated Creatinine Clearance: 55 mL/min (A) (by C-G formula based on SCr of 1.57 mg/dL (H)). Liver Function Tests: Recent Labs    Lab 11/27/18 0852 12/01/18 1918 12/02/18 0615  AST 14* 20 16  ALT 12 21 16   ALKPHOS 67 64 48  BILITOT 0.5 0.7 0.8  PROT 6.7 6.8 5.6*  ALBUMIN 2.9* 3.1* 2.7*   Recent Labs  Lab 12/01/18 1918  LIPASE 31   No results for input(s): AMMONIA in the last 168 hours. Coagulation Profile: No results for input(s): INR, PROTIME in the last 168 hours. Cardiac Enzymes: Recent Labs  Lab 12/01/18 1918  TROPONINI <0.03   BNP (last 3 results) No results for input(s): PROBNP in the last 8760 hours. HbA1C: No results for input(s): HGBA1C in the last 72 hours. CBG: No results for input(s): GLUCAP in the last 168 hours. Lipid Profile: No results for input(s): CHOL, HDL, LDLCALC, TRIG, CHOLHDL, LDLDIRECT in the last 72 hours. Thyroid Function Tests: No results for input(s): TSH, T4TOTAL, FREET4, T3FREE, THYROIDAB in the last 72 hours. Anemia Panel: No results for input(s): VITAMINB12, FOLATE, FERRITIN, TIBC, IRON, RETICCTPCT in the last 72 hours. Urine analysis:    Component Value Date/Time   COLORURINE YELLOW 12/02/2018 0018   APPEARANCEUR HAZY (A) 12/02/2018 0018   LABSPEC 1.014 12/02/2018 0018   PHURINE 5.0 12/02/2018 0018   GLUCOSEU NEGATIVE 12/02/2018 0018   GLUCOSEU NEGATIVE 12/30/2008 1531   HGBUR NEGATIVE 12/02/2018 0018   BILIRUBINUR NEGATIVE 12/02/2018 0018   BILIRUBINUR small 10/10/2017 1455   KETONESUR 5 (A) 12/02/2018 0018   PROTEINUR NEGATIVE 12/02/2018 0018   UROBILINOGEN 1.0 10/10/2017 1455   UROBILINOGEN 1.0 01/23/2014 2333   NITRITE NEGATIVE 12/02/2018 0018   LEUKOCYTESUR NEGATIVE 12/02/2018 0018   Sepsis Labs: @LABRCNTIP (procalcitonin:4,lacticidven:4)  ) Recent Results (from the past 240 hour(s))  Culture, blood (routine x 2)     Status: None (Preliminary result)   Collection Time: 12/02/18 12:00 AM  Result Value Ref Range Status   Specimen Description   Final    BLOOD RIGHT HAND Performed at [facility], [address], [place], [place]  [address]    Special Requests   Final    BOTTLES DRAWN AEROBIC AND ANAEROBIC Blood Culture adequate volume Performed at [facility], [address], [place], [place] [address]    Culture PENDING  Incomplete   Report Status PENDING  Incomplete      Studies: Ct Abdomen Pelvis Wo Contrast  Result Date: 12/01/2018 CLINICAL DATA:  Abdominal pain and nausea.  History of lung cancer. EXAM: CT ABDOMEN AND PELVIS WITHOUT CONTRAST TECHNIQUE: Multidetector CT imaging of the abdomen and pelvis was performed following the standard protocol without IV contrast. COMPARISON:  CT abdomen pelvis dated October 31, 2018. FINDINGS: Lower chest: Unchanged peripheral nodular consolidation in the right lower lobe. Hepatobiliary: No focal liver abnormality  is seen. Small layering gallstones versus sludge. No gallbladder wall thickening or biliary dilatation. Pancreas: Unremarkable. No pancreatic ductal dilatation or surrounding inflammatory changes. Spleen: Normal in size without focal abnormality. Adrenals/Urinary Tract: Adrenal glands are unremarkable. Kidneys are normal, without renal calculi, focal lesion, or hydronephrosis. Bladder is unremarkable. Stomach/Bowel: Stomach is within normal limits. Appendix appears normal. No evidence of bowel wall thickening, distention, or inflammatory changes. Vascular/Lymphatic: Aortoiliac atherosclerosis. No enlarged abdominal or pelvic lymph nodes. Reproductive: Prostate is unremarkable. Other: Unchanged small fat containing umbilical and right inguinal hernias. No free fluid or pneumoperitoneum. Musculoskeletal: No acute or significant osseous findings. IMPRESSION: 1.  No acute intra-abdominal process. 2. Unchanged peripheral nodular consolidation in the right lower lobe. 3.  Aortic atherosclerosis (ICD10-I70.0). Electronically Signed   By: Obie Dredge M.D.   On: 12/01/2018 20:09   Dg Chest 2 View  Result Date: 12/01/2018 CLINICAL DATA:  Weakness.  History of  lung cancer. EXAM: CHEST - 2 VIEW COMPARISON:  CT chest from same day. Chest x-ray dated November 02, 2018. FINDINGS: Unchanged left chest wall port catheter. The heart size and mediastinal contours are within normal limits. Normal pulmonary vascularity. Patchy, nodular consolidation in the right lower lobe, better evaluated on chest CT from same day. No pleural effusion or pneumothorax. No acute osseous abnormality. IMPRESSION: 1. Patchy, nodular consolidation in the right lower lobe, better evaluated on chest CT from same day. Electronically Signed   By: Obie Dredge M.D.   On: 12/01/2018 20:04    Scheduled Meds: . aspirin EC  81 mg Oral Daily  . cholecalciferol  2,000 Units Oral Daily  . clopidogrel  75 mg Oral Daily  . dronabinol  5 mg Oral BID AC  . FLUoxetine  40 mg Oral Daily  . mirtazapine  30 mg Oral QHS  . mometasone-formoterol  2 puff Inhalation BID  . pantoprazole  40 mg Oral Daily  . prazosin  1 mg Oral QHS  . PRUTECT   Topical BID  . ranolazine  500 mg Oral BID  . rosuvastatin  20 mg Oral Daily  . sucralfate  1 g Oral TID AC & HS  . umeclidinium bromide  1 puff Inhalation Daily  . vitamin B-12  500 mcg Oral Daily  . zolpidem  5 mg Oral QHS    Continuous Infusions: . sodium chloride 125 mL/hr at 12/02/18 1451  . ceFEPime (MAXIPIME) IV 2 g (12/02/18 0343)     LOS: 0 days     Darlin Drop, MD [facility] Pager [phone]  If 7PM-7AM, please contact night-coverage [email or web address] Password [facility] 12/02/2018, 3:06 PM

## 2018-12-03 DIAGNOSIS — R1310 Dysphagia, unspecified: Secondary | ICD-10-CM

## 2018-12-03 DIAGNOSIS — K222 Esophageal obstruction: Secondary | ICD-10-CM

## 2018-12-03 LAB — URINE CULTURE: Culture: NO GROWTH

## 2018-12-03 LAB — INFLUENZA PANEL BY PCR (TYPE A & B)
Influenza A By PCR: NEGATIVE
Influenza B By PCR: POSITIVE — AB

## 2018-12-03 LAB — HEMOGLOBIN AND HEMATOCRIT, BLOOD
HCT: 24.3 % — ABNORMAL LOW (ref 39.0–52.0)
Hemoglobin: 7.6 g/dL — ABNORMAL LOW (ref 13.0–17.0)

## 2018-12-03 MED ORDER — OSELTAMIVIR PHOSPHATE 75 MG PO CAPS
75.0000 mg | ORAL_CAPSULE | Freq: Two times a day (BID) | ORAL | Status: DC
  Administered 2018-12-03: 75 mg via ORAL
  Filled 2018-12-03 (×2): qty 1

## 2018-12-03 MED ORDER — PROMETHAZINE HCL 25 MG/ML IJ SOLN
6.2500 mg | Freq: Four times a day (QID) | INTRAMUSCULAR | Status: DC | PRN
  Administered 2018-12-06 – 2018-12-11 (×10): 6.25 mg via INTRAVENOUS
  Filled 2018-12-03 (×10): qty 1

## 2018-12-03 MED ORDER — OSELTAMIVIR PHOSPHATE 6 MG/ML PO SUSR
75.0000 mg | Freq: Two times a day (BID) | ORAL | Status: AC
  Administered 2018-12-03 – 2018-12-07 (×8): 75 mg via ORAL
  Filled 2018-12-03 (×11): qty 12.5

## 2018-12-03 MED ORDER — FLUCONAZOLE 100MG IVPB
100.0000 mg | INTRAVENOUS | Status: DC
  Administered 2018-12-03 – 2018-12-08 (×6): 100 mg via INTRAVENOUS
  Filled 2018-12-03 (×6): qty 50

## 2018-12-03 MED ORDER — HYDROMORPHONE HCL 1 MG/ML IJ SOLN
1.0000 mg | INTRAMUSCULAR | Status: DC | PRN
  Administered 2018-12-03 – 2018-12-06 (×10): 1 mg via INTRAVENOUS
  Filled 2018-12-03 (×10): qty 1

## 2018-12-03 MED ORDER — PHENOL 1.4 % MT LIQD
1.0000 | OROMUCOSAL | Status: DC | PRN
  Administered 2018-12-03: 1 via OROMUCOSAL
  Filled 2018-12-03: qty 177

## 2018-12-03 NOTE — Consult Note (Addendum)
[facility] Consult: 11:59 AM 12/03/2018  LOS: 1 day    Referring Provider: Dr   Primary Care Physician:  [name], MD Primary Gastroenterologist:  Dr. Carlean Purl.       Reason for Consultation:  Odynophagia    HPI: Andrew West is a 53 y.o. male.  CAD. S/p multiple cardiac stents.  COPD.  DM2.   On Plavix for CAD.    Receiving chemo and radiation for small cell lung cancer.  Last chemo, cisplatin, on 11/23/18. Finished radiation in early 10/2018.     Inpt GI consult 11/01/18 for n/v, dysphagia.  Odynophagia only if food got stuck.   10/09/18 Esophagram:   1. Approximately 5.5 cm smooth narrowing of the proximal esophagus. This is near the region of the patient's right paratracheal adenopathy. The narrowing could be related to radiation therapy or, less likely, mass effect from the paratracheal adenopathy on the trachea causing compression of the esophagus between the trachea and the aorta.  2. I was not able to distend the distal esophagus beyond about 12 mm, and accordingly a smooth narrowing in the distal esophagus is likewise not excluded.  3.  Aortic Atherosclerosis  EGD delayed due to severe pancytopenia.  Received TPN for a few days.  reieved 4 PRBCs and 3 platelets during admission.  By the time EGD was feasible, pt had left AMA.   Presented back to ED 1/10, readmitted with N/V, weakness, diffuse abd pain, diminished po.  Food and liquid stick in region of upper esophagus, at which points he has to vomit.  Able to tolerate liquid po: ensure, sherbert intermittently but sometimes these get stuck as well.    Weight 183 #, down from 267 #.     CT chest with no change in RLL nodular consolidation, ?RLL PNA.  Ruled in for flu.   WBCs 0.8, were 7.6 on 12/31, 3 on 11/27/18.   Hgb 9.1 >> 7.5 after  re-hydration.  Was 9.6 on 11/27/18.   Platelets 31 K, were 150.   + AKI.  Low albumin  On Maxipime, Tamiflu Last Plavix was this morning.   GI meds include Carafate qid, Protonix 40 mg/day.  Marinol BID. Prn Compazine.    Despite the + flu test, pt has no fevers, myalgias, increased cough.     Past Medical History:  Diagnosis Date  . Anxiety   . CAD (coronary artery disease)    a. s/p multiple PCIs with last cath 11/2016 with severe multivessel CAD, s/p PCTA to LCx but unable to pass stent  . Chronic leg pain    bilateral  . Chronic lower back pain   . COPD (chronic obstructive pulmonary disease) ([place])   . Depression   . GERD (gastroesophageal reflux disease)    Takes Dexilant  . HLD (hyperlipidemia)   . Hypertension   . Rhabdomyolysis    h/o, r/t statins  . Sleep apnea    "can't tolerate mask" (12/16/2016)  . Type II diabetes mellitus ([place])     Past Surgical History:  Procedure Laterality Date  .  BACK SURGERY    . BRONCHIAL BIOPSY  08/25/2018   Procedure: BRONCHIAL BIOPSIES;  Surgeon: Garner Nash, DO;  Location: [facility];  Service: Cardiopulmonary;;  . CARDIAC CATHETERIZATION N/A 09/25/2015   Procedure: Left Heart Cath and Coronary Angiography;  Surgeon: Leonie Man, MD;  Location: [place] [facility];  Service: Cardiovascular;  Laterality: N/A;  . CARDIAC CATHETERIZATION N/A 12/16/2016   Procedure: Left Heart Cath and Coronary Angiography;  Surgeon: Leonie Man, MD;  Location: [place] [facility];  Service: Cardiovascular;  Laterality: N/A;  . CARDIAC CATHETERIZATION N/A 12/16/2016   Procedure: Coronary Balloon Angioplasty;  Surgeon: Leonie Man, MD;  Location: [place] [facility];  Service: Cardiovascular;  Laterality: N/A;  . COLONOSCOPY W/ POLYPECTOMY    . CORONARY ANGIOPLASTY  09/25/2015   mid cir & om  . CORONARY ANGIOPLASTY WITH STENT PLACEMENT  10/09/2001   PTCA & stenting of mid AV circumflex; 2.5x48m Pixel stent  . CORONARY ANGIOPLASTY WITH STENT  PLACEMENT  12/13/2001   PCI with stent to mid L circumflex, 95% stenosis to 0% residual  . CORONARY ANGIOPLASTY WITH STENT PLACEMENT  10/10/2003   PCI to mid AV circumflex; LAD 30% disease; RCA 100% occluded prox.  . CORONARY ANGIOPLASTY WITH STENT PLACEMENT  09/01/2011   PCI with stenting with bare metal stent to mid AV groove circumflex and PDA  . CORONARY ANGIOPLASTY WITH STENT PLACEMENT  10/17/2011   cutting balloon angioplasty of ostial lateral OM1 branch and bifurcation AV groove circumflex OM junction; stenosis reduced to 0%  . ENDOBRONCHIAL ULTRASOUND Bilateral 08/25/2018   Procedure: ENDOBRONCHIAL ULTRASOUND;  Surgeon: IGarner Nash DO;  Location: [facility];  Service: Cardiopulmonary;  Laterality: Bilateral;  . EXCISIONAL HEMORRHOIDECTOMY    . FINE NEEDLE ASPIRATION  08/25/2018   Procedure: FINE NEEDLE ASPIRATION;  Surgeon: IGarner Nash DO;  Location: [facility];  Service: Cardiopulmonary;;  . FLEXIBLE BRONCHOSCOPY  08/25/2018   Procedure: FLEXIBLE BRONCHOSCOPY;  Surgeon: IGarner Nash DO;  Location: [facility];  Service: Cardiopulmonary;;  . IR IMAGING GUIDED PORT INSERTION  09/29/2018  . LEFT HEART CATHETERIZATION WITH CORONARY ANGIOGRAM N/A 10/18/2011   Procedure: LEFT HEART CATHETERIZATION WITH CORONARY ANGIOGRAM;  Surgeon: DLeonie Man MD;  [facility];  Service: Cardiovascular;  Laterality: N/A;  . LUMBAR LAMINECTOMY/DECOMPRESSION MICRODISCECTOMY  03/31/2012   Procedure: LUMBAR LAMINECTOMY/DECOMPRESSION MICRODISCECTOMY 1 LEVEL;  Surgeon: HCharlie Pitter MD;  [facility];  Service: Neurosurgery;  Laterality: Left;  . TRANSTHORACIC ECHOCARDIOGRAM  07/28/2011   EF 55-65%; LVH, grade 1 diastolic dysfunction;     Prior to Admission medications   Medication Sig Start Date End Date Taking? Authorizing Provider  albuterol (PROAIR HFA) 108 (90 Base) MCG/ACT inhaler Inhale 1 puff into the lungs every 6 (six) hours as needed for wheezing or shortness  of breath. 08/29/18  Yes NCharlott Rakes MD  amLODipine (NORVASC) 10 MG tablet Take 1 tablet (10 mg total) by mouth daily. 04/20/18  Yes NCharlott Rakes MD  aspirin 81 MG tablet Take 1 tablet (81 mg total) daily by mouth. 10/10/17  Yes Newlin, Enobong, MD  Cholecalciferol (VITAMIN D) 2000 units tablet Take 1 tablet (2,000 Units total) by mouth daily. 08/04/18  Yes PJamse Arn MD  FLUoxetine (PROZAC) 40 MG capsule Take 1 capsule (40 mg total) by mouth daily. 04/20/18  Yes Newlin, ECharlane Ferretti MD  Fluticasone-Salmeterol (ADVAIR) 100-50 MCG/DOSE AEPB Inhale 1 puff into the lungs 2 (two) times daily. 07/25/18  Yes Newlin, Enobong,  MD  mirtazapine (REMERON) 30 MG tablet Take 1 tablet (30 mg total) by mouth at bedtime. 08/31/18  Yes Curt Bears, MD  nicotine (NICODERM CQ) 21 mg/24hr patch Place 1 patch (21 mg total) onto the skin daily. 08/31/18  Yes Curt Bears, MD  pantoprazole (PROTONIX) 40 MG tablet Take 1 tablet (40 mg total) by mouth daily. 10/11/18  Yes Aline August, MD  potassium chloride 20 MEQ TBCR Take 10 mEq by mouth daily. 11/13/18  Yes Curt Bears, MD  prazosin (MINIPRESS) 1 MG capsule Take 1 capsule (1 mg total) by mouth at bedtime. For nightmares 07/25/18  Yes [name], MD  ranolazine (RANEXA) 1000 MG SR tablet Take 1 tablet (1,000 mg total) by mouth 2 (two) times daily. 05/24/17  Yes Hilty, Nadean Corwin, MD  rosuvastatin (CRESTOR) 20 MG tablet Take 1 tablet (20 mg total) by mouth daily. 07/26/18  Yes [name], MD  sucralfate (CARAFATE) 1 g tablet Take 1 tablet by mouth 4 (four) times daily -  before meals and at bedtime. 09/23/18  Yes [provider]  tiotropium (SPIRIVA HANDIHALER) 18 MCG inhalation capsule Place 1 capsule (18 mcg total) into inhaler and inhale daily. 04/20/18  Yes [name], MD  vitamin B-12 (CYANOCOBALAMIN) 500 MCG tablet Take 1 tablet (500 mcg total) by mouth daily. 06/30/18  Yes Jamse Arn, MD  zolpidem (AMBIEN) 5 MG tablet Take  1 tablet (5 mg total) by mouth at bedtime as needed for sleep. Patient taking differently: Take 5 mg by mouth at bedtime.  09/14/18  Yes [name], MD  baclofen (LIORESAL) 10 MG tablet Take 1 tablet (10 mg total) by mouth 3 (three) times daily as needed for muscle spasms. 10/11/18   Aline August, MD  benzonatate (TESSALON) 100 MG capsule Take 1 capsule (100 mg total) by mouth every 8 (eight) hours. Patient taking differently: Take 100 mg by mouth 3 (three) times daily as needed for cough.  09/19/18   Curt Bears, MD  Blood Glucose Monitoring Suppl (ACCU-CHEK AVIVA PLUS) w/Device KIT Use as dircted 07/12/17   [name], MD  clopidogrel (PLAVIX) 75 MG tablet Take 1 tablet (75 mg total) by mouth daily. YOU MAY RESTART 08/26/2018 08/25/18   Icard, Leory Plowman L, DO  diclofenac sodium (VOLTAREN) 1 % GEL Apply 2 g topically 4 (four) times daily. Patient taking differently: Apply 2 g topically 4 (four) times daily as needed (pain).  04/20/18   [name], MD  dronabinol (MARINOL) 5 MG capsule Take 1 capsule (5 mg total) by mouth 2 (two) times daily before a meal. 10/18/18   Tyler Pita, MD  fluticasone [facility]) 50 MCG/ACT nasal spray Place 2 sprays into both nostrils daily as needed for allergies. 10/11/18   Aline August, MD  glucose blood (ACCU-CHEK AVIVA) test strip Use as instructed 07/12/17   [name], MD  hydrALAZINE (APRESOLINE) 25 MG tablet Take 1 tablet (25 mg total) by mouth 2 (two) times daily. 04/20/18   [name], MD  HYDROcodone-acetaminophen (NORCO) 5-325 MG tablet Take 1 tablet by mouth every 6 (six) hours as needed for moderate pain. 11/13/18   Curt Bears, MD  hydrocortisone-pramoxine [facility]) 2.5-1 % rectal cream Place 1 application rectally as needed. Patient taking differently: Place 1 application rectally as needed for hemorrhoids or anal itching.  10/11/18   Aline August, MD  isosorbide mononitrate (IMDUR) 120 MG 24 hr tablet Take 1 tablet  (120 mg total) by mouth daily. 04/20/18   [name], MD  lidocaine-prilocaine (EMLA)  cream Apply 1 application topically as needed. Apply 1 tsp on skin over port site one hour prior to chemotherapy. Cover with plastic wrap. Do not rub in medication. 11/27/18   Curt Bears, MD  LORazepam (ATIVAN) 0.5 MG tablet 1 tab po q 4-6 hours prn or 1 tab po 30 minutes prior to radiation 08/30/18   Hayden Pedro, PA-C  Nebivolol HCl 20 MG TABS Take 1 tablet (20 mg total) by mouth daily. 01/26/18   Hilty, Nadean Corwin, MD  nitroGLYCERIN (NITROSTAT) 0.4 MG SL tablet Place 1 tablet (0.4 mg total) under the tongue every 5 (five) minutes as needed for chest pain. Patient taking differently: Place 0.4 mg under the tongue every 5 (five) minutes as needed for chest pain. Chest pain 12/24/16   Ahmed Prima, Fransisco Hertz, PA-C  ondansetron (ZOFRAN) 8 MG tablet Take 1 tablet (8 mg total) by mouth every 8 (eight) hours as needed for nausea or vomiting. 09/20/18   Curt Bears, MD  prochlorperazine (COMPAZINE) 10 MG tablet Take 1 tablet (10 mg total) by mouth every 6 (six) hours as needed for nausea or vomiting. 10/23/18   Curt Bears, MD  promethazine (PHENERGAN) 25 MG tablet Take 1 tablet (25 mg total) by mouth every 6 (six) hours as needed for nausea or vomiting. 10/22/18   Deno Etienne, DO    Scheduled Meds: . aspirin EC  81 mg Oral Daily  . cholecalciferol  2,000 Units Oral Daily  . clopidogrel  75 mg Oral Daily  . dronabinol  5 mg Oral BID AC  . FLUoxetine  40 mg Oral Daily  . mirtazapine  30 mg Oral QHS  . mometasone-formoterol  2 puff Inhalation BID  . oseltamivir  75 mg Oral BID  . pantoprazole  40 mg Oral Daily  . prazosin  1 mg Oral QHS  . PRUTECT   Topical BID  . ranolazine  500 mg Oral BID  . rosuvastatin  20 mg Oral Daily  . sucralfate  1 g Oral TID AC & HS  . umeclidinium bromide  1 puff Inhalation Daily  . vitamin B-12  500 mcg Oral Daily  . zolpidem  5 mg Oral QHS   Infusions: . sodium  chloride Stopped (12/03/18 0517)  . ceFEPime (MAXIPIME) IV 2 g (12/03/18 0517)   PRN Meds: acetaminophen **OR** acetaminophen, albuterol, benzonatate, diphenhydrAMINE, HYDROcodone-acetaminophen, HYDROmorphone (DILAUDID) injection, ondansetron **OR** ondansetron (ZOFRAN) IV, phenol, prochlorperazine, promethazine   Allergies as of 12/01/2018 - Review Complete 12/01/2018  Allergen Reaction Noted  . Iohexol Anaphylaxis 11/26/2008    Family History  Problem Relation Age of Onset  . Heart attack Father   . Hypertension Mother   . Diabetes Mother   . Heart disease Brother        x 3   . Heart attack Brother        deceased  . Hypertension Sister   . Diabetes Sister   . Anesthesia problems Neg Hx   . Hypotension Neg Hx   . Malignant hyperthermia Neg Hx   . Pseudochol deficiency Neg Hx    Social History   Tobacco Use  . Smoking status: Former Smoker    Packs/day: 0.25    Years: 25.00    Pack years: 6.25    Types: Cigarettes    Last attempt to quit: 05/24/2015    Years since quitting: 3.5  . Smokeless tobacco: Never Used  Substance Use Topics  . Alcohol use: No    Alcohol/week: 0.0 standard drinks  .  Drug use: No     REVIEW OF SYSTEMS: Constitutional:  Weakness, fatigue ENT:  No nose bleeds Pulm:  + cough.   CV:  No palpitations, no LE edema.  GU:  No hematuria, no frequency GI: See HPI.  Not constipated.  Not vomiting blood. Heme: No excessive bleeding or bruising. Transfusions:   Neuro:  No headaches, no peripheral tingling or numbness Derm:  No itching, no rash or sores.  Endocrine:  No sweats or chills.  No polyuria or dysuria Immunization:  Did not ask him about vaccinations Travel:  None beyond local counties in last few months.    PHYSICAL EXAM: Vital signs in last 24 hours: Vitals:   12/02/18 2107 12/03/18 0522  BP: 123/88 (!) 136/94  Pulse: 88 95  Resp: 14 14  Temp: 98.6 F (37 C) 98 F (36.7 C)  SpO2: 97% 97%   Wt Readings from Last 3  Encounters:  12/02/18 83.7 kg  11/13/18 92.2 kg  11/07/18 96.5 kg    General: Pleasant, tired looking but not toxic appearing AAM. Head: No facial asymmetry or swelling.  No signs of head trauma. Eyes: No scleral icterus.  No conjunctival pallor.  EOMI. Ears: Not hard of hearing Nose: Congestion or discharge. Mouth: Oropharynx moist, clear, pink.  Tongue midline. Neck: No JVD, no masses, no thyromegaly. Lungs: Clear bilaterally.  Reduced breath sounds on the right.  No adventitious sounds.  No cough, no dyspnea Heart: RRR.  No MRG.  S1, S2 audible. Abdomen: Soft.  Minor tenderness in the lower abdomen, nonfocal.  No HSM, bruits, hernias, masses.   Rectal: Deferred Musc/Skeltl: No joint redness, swelling, gross deformity. Extremities: No CCE. Neurologic: Alert.  Oriented x3.  Moves all 4 limbs, full strength.  No tremors. Skin: No rashes, no sores. Nodes: No cervical adenopathy. Psych: Flat affect, cooperative, fluid speech.  Intake/Output from previous day: 01/11 0701 - 01/12 0700 In: 2554.4 [I.V.:2454.4; IV Piggyback:100] Out: 1025 [Urine:1025]  LAB RESULTS: Recent Labs    12/01/18 1918 12/02/18 0615 12/03/18 0800  WBC 0.9* 0.8*  --   HGB 9.1* 7.5* 7.6*  HCT 28.4* 24.0* 24.3*  PLT 43* 31*  --    BMET Lab Results  Component Value Date   NA 142 12/02/2018   NA 142 12/01/2018   NA 142 11/27/2018   K 3.9 12/02/2018   K 3.5 12/01/2018   K 3.6 11/27/2018   CL 108 12/02/2018   CL 105 12/01/2018   CL 105 11/27/2018   CO2 25 12/02/2018   CO2 25 12/01/2018   CO2 25 11/27/2018   GLUCOSE 73 12/02/2018   GLUCOSE 88 12/01/2018   GLUCOSE 92 11/27/2018   BUN 28 (H) 12/02/2018   BUN 32 (H) 12/01/2018   BUN 29 (H) 11/27/2018   CREATININE 1.57 (H) 12/02/2018   CREATININE 1.70 (H) 12/01/2018   CREATININE 1.94 (H) 11/27/2018   CALCIUM 8.3 (L) 12/02/2018   CALCIUM 8.7 (L) 12/01/2018   CALCIUM 9.0 11/27/2018   LFT Recent Labs    12/01/18 1918 12/02/18 0615  PROT 6.8  5.6*  ALBUMIN 3.1* 2.7*  AST 20 16  ALT 21 16  ALKPHOS 64 48  BILITOT 0.7 0.8   PT/INR Lab Results  Component Value Date   INR 1.04 09/29/2018   INR 1.1 (H) 08/17/2018   INR 1.0 12/13/2016   Lipase     Component Value Date/Time   LIPASE 31 12/01/2018 1918     RADIOLOGY STUDIES: Ct Abdomen Pelvis Wo Contrast  Result Date: 12/01/2018 CLINICAL DATA:  Abdominal pain and nausea.  History of lung cancer. EXAM: CT ABDOMEN AND PELVIS WITHOUT CONTRAST TECHNIQUE: Multidetector CT imaging of the abdomen and pelvis was performed following the standard protocol without IV contrast. COMPARISON:  CT abdomen pelvis dated October 31, 2018. FINDINGS: Lower chest: Unchanged peripheral nodular consolidation in the right lower lobe. Hepatobiliary: No focal liver abnormality is seen. Small layering gallstones versus sludge. No gallbladder wall thickening or biliary dilatation. Pancreas: Unremarkable. No pancreatic ductal dilatation or surrounding inflammatory changes. Spleen: Normal in size without focal abnormality. Adrenals/Urinary Tract: Adrenal glands are unremarkable. Kidneys are normal, without renal calculi, focal lesion, or hydronephrosis. Bladder is unremarkable. Stomach/Bowel: Stomach is within normal limits. Appendix appears normal. No evidence of bowel wall thickening, distention, or inflammatory changes. Vascular/Lymphatic: Aortoiliac atherosclerosis. No enlarged abdominal or pelvic lymph nodes. Reproductive: Prostate is unremarkable. Other: Unchanged small fat containing umbilical and right inguinal hernias. No free fluid or pneumoperitoneum. Musculoskeletal: No acute or significant osseous findings. IMPRESSION: 1.  No acute intra-abdominal process. 2. Unchanged peripheral nodular consolidation in the right lower lobe. 3.  Aortic atherosclerosis (ICD10-I70.0). Electronically Signed   By: Titus Dubin M.D.   On: 12/01/2018 20:09   Dg Chest 2 View  Result Date: 12/01/2018 CLINICAL DATA:   Weakness.  History of lung cancer. EXAM: CHEST - 2 VIEW COMPARISON:  CT chest from same day. Chest x-ray dated November 02, 2018. FINDINGS: Unchanged left chest wall port catheter. The heart size and mediastinal contours are within normal limits. Normal pulmonary vascularity. Patchy, nodular consolidation in the right lower lobe, better evaluated on chest CT from same day. No pleural effusion or pneumothorax. No acute osseous abnormality. IMPRESSION: 1. Patchy, nodular consolidation in the right lower lobe, better evaluated on chest CT from same day. Electronically Signed   By: Titus Dubin M.D.   On: 12/01/2018 20:04      IMPRESSION:   *   Several months of dysphagia/odynophagia, regurgitating significant bulk of his p.o. intake.  Associated weight loss and malnutrition. Esophagram in mid 09/2018 showed proximal esophageal narrowing in the region of the paratracheal adenopathy.  Narrowing possibly secondary to radiation and less likely from mass-effect of the adenopathy.  *   CAD.  Status post cardiac stenting.  On chronic Plavix, last dose was this morning.  *     Squamous cell lung cancer, completed XRT in early 10/2018, ongoing chemo.  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  *      Flu enzymes be positive, possible right sided pneumonia.  On Tamiflu and Maxipime.    PLAN:     *   Patient is again pancytopenic to the point that we should wait on EGD until his blood counts rebound.  Another issue is his Plavix, can this be held in case we need to biopsy or dilate his esophagus at the time of EGD?Marland Kitchen  Preferably would be off Plavix for 5 days prior to EGD.  *   Should we support patient's nutritional needs with TPN versus core tract feeding tube?   Azucena Freed  12/03/2018, 11:59 AM Phone [place] Attending   I have taken an interval history, reviewed the chart and examined the patient. I agree with the Advanced Practitioner's note,  impression and recommendations.   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

## 2018-12-03 NOTE — Progress Notes (Addendum)
PROGRESS NOTE  Andrew West ZOX:[id number] DOB: 1966-07-26 DOA: 12/01/2018 PCP: [name], MD  HPI/Recap of past 24 hours:  Andrew West is a 53 y.o. male with medical history significant of SCLC chemo x8 days ago, HTN, CAD. Patient presents to the ED with c/o multiple episodes of N/V for past couple of days.  Associated generalized weakness.  Generalized abd pain.  Poor PO intake.  ED Course: CT today shows ? RLL PNA.  WBC 0.9k, HGB 9.1, platelets 43.  12/02/2018: Patient seen and examined at bedside.  Ill-appearing reports pain at periumbilical region tender on palpation.  Also reports nausea with vomiting last night.  Poor oral intake due to persistent nausea.  12/03/2018: Patient seen and examined with his wife at bedside.  Reports chronic pain when he swallows, symptoms present for months.  Also persistent nausea with vomiting this morning.  Influenza B positive.  GI consulted to further assess odynophagia.    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 Given N/V, ANC 700, feel that its probably best to treat this as PNA for the moment. 2. Continue cefepime 3. Started on Tamiflu 75 mg twice daily x5 days 4. No fever, but does have N/V 5. Blood cultures peripherally x2- to date  AKI on CKD 2.  Baseline creatinine appears to be 1.4 with GFR greater than 60.  Improving- likely pre-renal / dehydration from N/V - 6. IVF 7. Creatinine 1.57 from 1.70  N/V / abd pain - 8. Supportive care 9. IVF 10. Repeat CMP in AM 11. IV Zofran, IV Phenergan, compazine PRN nausea  Odynophagia: Chronic, GI consulted and following.  Start fluconazole as recommended by  GI; will need to be off Plavix for possible EGD and white count will need to be improved.  Pancytopenia, worsening-WBC 0.8 from 0.9, hemoglobin 7.6 from 9.1, platelet 31 from 43 12. Oncology consulted  SCLC - IP consult to oncology as above  HTN - holding home BP meds for the moment  CAD s/p multiple PCI's-Home meds include statin, ASA, plavix.  Plavix, held by GI on 12/03/2018  Hypomagnesemia - replace mag.   Cancer related pain.  Patient states he hurts all over.  On chronic pain medication at home.  Continue IV Dilaudid 0.5 mg every 4 hours as needed for severe pain.  Resume his home pain regimen.   DVT prophylaxis: SCDs - thrombocytopenia Code Status: Full Family Communication:  Wife at bedside.  All questions answered to her satisfaction. Disposition Plan:  Home when oncology and GI signed off. Consults called:  Oncology, GI   Objective: Vitals:   12/02/18 2052 12/02/18 2107 12/03/18 0522 12/03/18 1252  BP:  123/88 (!) 136/94 113/84  Pulse:  88 95 (!) 104  Resp:  14 14 18   Temp:  98.6 F (37 C) 98 F (36.7 C) 98.6 F (37 C)  TempSrc:  Oral Oral Oral  SpO2: 95% 97% 97% 97%  Weight:      Height:        Intake/Output Summary (Last 24 hours) at 12/03/2018 1624 Last data filed at 12/03/2018 1501 Gross per 24 hour  Intake 1906.1 ml  Output 1325  ml  Net 581.1 ml   Filed Weights   12/01/18 1836 12/02/18 0012  Weight: 90 kg 83.7 kg    Exam:  . General: 53 y.o. year-old male well-developed well-nourished.  Appears uncomfortable due to odynophagia and cancer related pain.  No oral thrush noted on exam. . Cardiovascular: Regular rate and rhythm with no rubs or gallops.  No JVD or thyromegaly noted.   Marland Kitchen Respiratory: Clear to auscultation with no wheezes or rales.  Good inspiratory effort. . Abdomen: Soft nontender nondistended with normal bowel sounds x4 quadrants. . Musculoskeletal: Trace lower extremity edema. 2/4 pulses in all 4 extremities. Marland Kitchen Psychiatry: Mood is  appropriate for condition and setting   Data Reviewed: CBC: Recent Labs  Lab 11/27/18 0852 12/01/18 1918 12/02/18 0615 12/03/18 0800  WBC 3.0* 0.9* 0.8*  --   NEUTROABS 2.8 0.7*  --   --   HGB 9.6* 9.1* 7.5* 7.6*  HCT 29.8* 28.4* 24.0* 24.3*  MCV 85.1 86.9 89.6  --   PLT 150 43* 31*  --    Basic Metabolic Panel: Recent Labs  Lab 11/27/18 0852 12/01/18 1918 12/02/18 0615  NA 142 142 142  K 3.6 3.5 3.9  CL 105 105 108  CO2 25 25 25   GLUCOSE 92 88 73  BUN 29* 32* 28*  CREATININE 1.94* 1.70* 1.57*  CALCIUM 9.0 8.7* 8.3*  MG 1.4* 1.4*  --    GFR: Estimated Creatinine Clearance: 55 mL/min (A) (by C-G formula based on SCr of 1.57 mg/dL (H)). Liver Function Tests: Recent Labs  Lab 11/27/18 0852 12/01/18 1918 12/02/18 0615  AST 14* 20 16  ALT 12 21 16   ALKPHOS 67 64 48  BILITOT 0.5 0.7 0.8  PROT 6.7 6.8 5.6*  ALBUMIN 2.9* 3.1* 2.7*   Recent Labs  Lab 12/01/18 1918  LIPASE 31   No results for input(s): AMMONIA in the last 168 hours. Coagulation Profile: No results for input(s): INR, PROTIME in the last 168 hours. Cardiac Enzymes: Recent Labs  Lab 12/01/18 1918  TROPONINI <0.03   BNP (last 3 results) No results for input(s): PROBNP in the last 8760 hours. HbA1C: No results for input(s): HGBA1C in the last 72 hours. CBG: No results for input(s): GLUCAP in the last 168 hours. Lipid Profile: No results for input(s): CHOL, HDL, LDLCALC, TRIG, CHOLHDL, LDLDIRECT in the last 72 hours. Thyroid Function Tests: No results for input(s): TSH, T4TOTAL, FREET4, T3FREE, THYROIDAB in the last 72 hours. Anemia Panel: No results for input(s): VITAMINB12, FOLATE, FERRITIN, TIBC, IRON, RETICCTPCT in the last 72 hours. Urine analysis:    Component Value Date/Time   COLORURINE YELLOW 12/02/2018 0018   APPEARANCEUR HAZY (A) 12/02/2018 0018   LABSPEC 1.014 12/02/2018 0018   PHURINE 5.0 12/02/2018 0018   GLUCOSEU NEGATIVE 12/02/2018 0018   GLUCOSEU NEGATIVE 12/30/2008  1531   HGBUR NEGATIVE 12/02/2018 0018   BILIRUBINUR NEGATIVE 12/02/2018 0018   BILIRUBINUR small 10/10/2017 1455   KETONESUR 5 (A) 12/02/2018 0018   PROTEINUR NEGATIVE 12/02/2018 0018   UROBILINOGEN 1.0 10/10/2017 1455   UROBILINOGEN 1.0 01/23/2014 2333   NITRITE NEGATIVE 12/02/2018 0018   LEUKOCYTESUR NEGATIVE 12/02/2018 0018   Sepsis Labs: @LABRCNTIP (procalcitonin:4,lacticidven:4)  ) Recent Results (from the past 240 hour(s))  Culture, blood (routine x 2)     Status: None (Preliminary result)   Collection Time: 12/01/18  9:33 PM  Result Value Ref Range Status   Specimen Description   Final    BLOOD PORTA CATH Performed at [facility], [address] [place], [place] [address]    Special Requests   Final    BOTTLES DRAWN AEROBIC AND ANAEROBIC Blood Culture adequate volume Performed at [facility], [address] [place], [place] [address]    Culture   Final    NO GROWTH 1 DAY Performed at [facility], [address] [place], [place] [address]    Report Status PENDING  Incomplete  Culture, blood (routine x 2)     Status: None (Preliminary result)   Collection Time: 12/02/18 12:00 AM  Result Value Ref Range Status   Specimen Description   Final    BLOOD RIGHT HAND Performed at [facility], [address] [place], [place] [address]    Special Requests   Final    BOTTLES DRAWN AEROBIC AND ANAEROBIC Blood Culture adequate volume Performed at [facility], [address] [place], [place] [address]    Culture   Final    NO GROWTH 1 DAY Performed at [facility], [address] [place], [place] [address]    Report Status PENDING  Incomplete  Urine culture     Status: None   Collection Time: 12/02/18 12:18 AM  Result Value Ref Range Status   Specimen Description URINE, CLEAN CATCH  Final   Special Requests   Final    NONE Performed at [facility], [address] [place], [place] [address]    Culture NO GROWTH  Final   Report Status 12/03/2018 FINAL  Final      Studies: No results found.  Scheduled Meds: . aspirin EC  81 mg Oral Daily  . cholecalciferol  2,000 Units Oral Daily  . dronabinol  5 mg Oral BID AC  . FLUoxetine  40 mg Oral Daily  . mirtazapine  30 mg Oral QHS  . mometasone-formoterol  2 puff Inhalation BID  . oseltamivir  75 mg Oral BID  . pantoprazole  40 mg Oral Daily  . prazosin  1 mg Oral QHS  . PRUTECT   Topical BID  . ranolazine  500 mg Oral BID  . rosuvastatin  20 mg Oral Daily  . sucralfate  1 g Oral TID AC & HS  . umeclidinium bromide  1 puff Inhalation Daily  . vitamin B-12  500 mcg Oral Daily  . zolpidem  5 mg Oral QHS    Continuous Infusions: . sodium chloride 125 mL/hr at 12/03/18 1501  . ceFEPime (MAXIPIME) IV 2 g (12/03/18 0517)  . fluconazole (DIFLUCAN) IV       LOS: 1 day     Darlin Drop, MD [facility] Pager [phone]  If 7PM-7AM, please contact night-coverage [email or web address] Password [facility] 12/03/2018, 4:24 PM

## 2018-12-03 NOTE — Progress Notes (Addendum)
Night shift nurse gave report of patient having trouble swallowing and pain. Examined mouth, patient's tongue appeared to be slightly swollen towards the back. Made Dr. Aileen Fass aware. Gave patient 8 am & 10 am po medicines. Patient was repeatedly complaining of throat pain and soreness.Ordered chloraseptic spray. Patient still c/o issue.  Upon entering room around 11 am, patient still had medicines from this morning sitting on his tray. Stated that he "can't even swallow water." Notified Dr. Aileen Fass, order placed for evaluation by [facility].

## 2018-12-03 NOTE — Progress Notes (Signed)
Advised and educated patient & his wife that anyone coming into the room should wear a face mask due to the patient testing positive for the flu.

## 2018-12-04 ENCOUNTER — Ambulatory Visit: Payer: Medicaid Other | Admitting: Internal Medicine

## 2018-12-04 DIAGNOSIS — Z79899 Other long term (current) drug therapy: Secondary | ICD-10-CM

## 2018-12-04 DIAGNOSIS — R112 Nausea with vomiting, unspecified: Secondary | ICD-10-CM

## 2018-12-04 DIAGNOSIS — T451X5A Adverse effect of antineoplastic and immunosuppressive drugs, initial encounter: Secondary | ICD-10-CM

## 2018-12-04 DIAGNOSIS — D61810 Antineoplastic chemotherapy induced pancytopenia: Secondary | ICD-10-CM

## 2018-12-04 LAB — CBC
HCT: 21.3 % — ABNORMAL LOW (ref 39.0–52.0)
Hemoglobin: 6.8 g/dL — CL (ref 13.0–17.0)
MCH: 28.2 pg (ref 26.0–34.0)
MCHC: 31.9 g/dL (ref 30.0–36.0)
MCV: 88.4 fL (ref 80.0–100.0)
Platelets: 12 10*3/uL — CL (ref 150–400)
RBC: 2.41 MIL/uL — ABNORMAL LOW (ref 4.22–5.81)
RDW: 15 % (ref 11.5–15.5)
WBC: 0.5 10*3/uL — CL (ref 4.0–10.5)
nRBC: 0 % (ref 0.0–0.2)

## 2018-12-04 LAB — CREATININE, SERUM
Creatinine, Ser: 1.39 mg/dL — ABNORMAL HIGH (ref 0.61–1.24)
GFR calc Af Amer: >60 mL/min (ref >60)
GFR calc non Af Amer: 58 mL/min — ABNORMAL LOW (ref >60)

## 2018-12-04 LAB — PREPARE RBC (CROSSMATCH)

## 2018-12-04 MED ORDER — SODIUM CHLORIDE 0.9% IV SOLUTION
Freq: Once | INTRAVENOUS | Status: AC
  Administered 2018-12-04: 14:00:00 via INTRAVENOUS

## 2018-12-04 MED ORDER — TBO-FILGRASTIM 480 MCG/0.8ML ~~LOC~~ SOSY
480.0000 ug | PREFILLED_SYRINGE | Freq: Every day | SUBCUTANEOUS | Status: DC
  Administered 2018-12-04 – 2018-12-06 (×3): 480 ug via SUBCUTANEOUS
  Filled 2018-12-04 (×5): qty 0.8

## 2018-12-04 MED ORDER — SUCRALFATE 1 GM/10ML PO SUSP
1.0000 g | Freq: Three times a day (TID) | ORAL | Status: DC
  Administered 2018-12-04 – 2018-12-13 (×25): 1 g via ORAL
  Filled 2018-12-04 (×29): qty 10

## 2018-12-04 MED ORDER — PANTOPRAZOLE SODIUM 40 MG PO PACK
40.0000 mg | PACK | Freq: Every day | ORAL | Status: DC
  Filled 2018-12-04: qty 20

## 2018-12-04 NOTE — Progress Notes (Signed)
[facility] Progress Note    Since last GI note: Unable to swallow food. Water stays down only a very brief time, causes pain.  Objective: Vital signs in last 24 hours: Temp:  [98.4 F (36.9 C)-98.6 F (37 C)] 98.6 F (37 C) (01/13 0606) Pulse Rate:  [84-104] 88 (01/13 0606) Resp:  [16-18] 16 (01/13 0606) BP: (112-121)/(81-86) 112/86 (01/13 0606) SpO2:  [97 %-98 %] 98 % (01/13 0606) Last BM Date: 11/30/18 General: alert and oriented times 3 Heart: regular rate and rythm Abdomen: soft, non-tender, non-distended, normal bowel sounds   Lab Results: Recent Labs    12/01/18 1918 12/02/18 0615 12/03/18 0800 12/04/18 0655  WBC 0.9* 0.8*  --  0.5*  HGB 9.1* 7.5* 7.6* 6.8*  PLT 43* 31*  --  12*  MCV 86.9 89.6  --  88.4   Recent Labs    12/01/18 1918 12/02/18 0615 12/04/18 0655  NA 142 142  --   K 3.5 3.9  --   CL 105 108  --   CO2 25 25  --   GLUCOSE 88 73  --   BUN 32* 28*  --   CREATININE 1.70* 1.57* 1.39*  CALCIUM 8.7* 8.3*  --    Recent Labs    12/01/18 1918 12/02/18 0615  PROT 6.8 5.6*  ALBUMIN 3.1* 2.7*  AST 20 16  ALT 21 16  ALKPHOS 64 48  BILITOT 0.7 0.8   Medications: Scheduled Meds: . sodium chloride   Intravenous Once  . aspirin EC  81 mg Oral Daily  . cholecalciferol  2,000 Units Oral Daily  . dronabinol  5 mg Oral BID AC  . FLUoxetine  40 mg Oral Daily  . mirtazapine  30 mg Oral QHS  . mometasone-formoterol  2 puff Inhalation BID  . oseltamivir  75 mg Oral BID  . pantoprazole  40 mg Oral Daily  . prazosin  1 mg Oral QHS  . PRUTECT   Topical BID  . ranolazine  500 mg Oral BID  . rosuvastatin  20 mg Oral Daily  . sucralfate  1 g Oral TID AC & HS  . umeclidinium bromide  1 puff Inhalation Daily  . vitamin B-12  500 mcg Oral Daily  . zolpidem  5 mg Oral QHS   Continuous Infusions: . sodium chloride 125 mL/hr at 12/04/18 0646  . ceFEPime (MAXIPIME) IV 2 g (12/04/18 7782)  . fluconazole (DIFLUCAN) IV 100 mg (12/03/18 1625)    PRN Meds:.acetaminophen **OR** acetaminophen, albuterol, benzonatate, diphenhydrAMINE, HYDROcodone-acetaminophen, HYDROmorphone (DILAUDID) injection, ondansetron **OR** ondansetron (ZOFRAN) IV, phenol, prochlorperazine, promethazine    Assessment/Plan: 53 y.o. male with dysphagia/odynophagia  His swallowing difficulty ddx is mediastinal adenopathy, XRT related stricture, infectious.  May be a combination of the three.  He is pancytopenic from chemo and so EGD is not currently safe. Also, last plavix was yesterday morning (12/03/2018).    I recommend starting TNA, it's bee a long time since he's had adequate calories.  Should also strongly consider G tube placement (IR) as it may take weeks for him to be able to take in adequate calories orally.  Continue diflucan IV (perhaps he has fungal esophagitis despite no oral thrush).  Will plan on EGD when able, soonest would be Wednesday or Thursday this week to allow plavix 5 day washout.  Pancytopenia continuing to deepen and so EGD may be even later than that.  Milus Banister, MD  12/04/2018, 7:47 AM [facility] Pager [phone]

## 2018-12-04 NOTE — Progress Notes (Signed)
Initial Nutrition Assessment  DOCUMENTATION CODES:   Non-severe (moderate) malnutrition in context of chronic illness  INTERVENTION:   -Recommend feeding tube placement (if N/V persists post-pyloric tube) to preserve gut function.  -Pt is at high risk of developing severe malnutrition if nutrition support is not initiated.  Patient will be at refeeding risk given prolonged poor PO intakes and malnutrition status.  TF recommendations:  Initiate Osmolite 1.5 @ 20 ml/hr and advance by 10 ml every 12 hours to goal rate of 70 ml/hr +30 ml Prostat BID which provides 2720 kcal, 135g protein and 1280 ml H2O.  NUTRITION DIAGNOSIS:   Moderate Malnutrition related to chronic illness, cancer and cancer related treatments as evidenced by percent weight loss, energy intake < or equal to 75% for > or equal to 1 month, mild muscle depletion.  GOAL:   Patient will meet greater than or equal to 90% of their needs  MONITOR:   PO intake, Labs, Weight trends, I & O's  REASON FOR ASSESSMENT:   Malnutrition Screening Tool    ASSESSMENT:   53 y.o.malewith medical history significant ofSCLC chemo x8 days ago, HTN, CAD. Patient presents to the ED with c/o multiple episodes of N/V for past couple of days. Associated generalized weakness. Generalized abd pain. Poor PO intake.  Patient in room with wife at bedside. Pt greeted RD but did not participate in conversation. Per pt's wife, pt has had no intakes recently other than water. He has been unable to tolerate Boost/Ensure supplements. Pt has had N/V consistently since starting cancer treatments. He now has difficulty swallowing solids and liquids. Has had difficulty swallowing medications. Pt is currently flu+.  GI evaluated this AM and recommended PEG placement for nutrition support. RD agrees but pt may need post-pyloric tube placement if N/V persists. RD to leave TF recommendations.  Pt is followed by [facility], last seen 12/3. Pt  was tolerating some intake at that time but had poor appetite. Pt was admitted to hospital 12/10-12/18, was started on TPN at that time.  Per weight records, pt has lost 25 lb since 12/10 (12% wt loss x 1 month, significant for time frame).  Labs reviewed. Medications: Vitamin D tablet daily, Marinol capsule BID, Remeron tablet daily, Vitamin B-12 tablet daily, Zofran PRN  NUTRITION - FOCUSED PHYSICAL EXAM:    Most Recent Value  Orbital Region  Unable to assess  Upper Arm Region  No depletion  Thoracic and Lumbar Region  Unable to assess  Buccal Region  Mild depletion  Temple Region  Mild depletion  Clavicle Bone Region  Mild depletion  Clavicle and Acromion Bone Region  No depletion  Scapular Bone Region  Unable to assess  Dorsal Hand  No depletion  Patellar Region  Unable to assess  Anterior Thigh Region  Unable to assess  Posterior Calf Region  Unable to assess  Edema (RD Assessment)  None       Diet Order:   Diet Order            Diet clear liquid Room service appropriate? Yes; Fluid consistency: Thin  Diet effective now              EDUCATION NEEDS:   Education needs have been addressed  Skin:  Skin Assessment: Reviewed RN Assessment  Last BM:  1/9  Height:   Ht Readings from Last 1 Encounters:  12/02/18 5\' 9"  (1.753 m)    Weight:   Wt Readings from Last 1 Encounters:  12/02/18 83.7  kg    Ideal Body Weight:  72.7 kg  BMI:  Body mass index is 27.25 kg/m.  Estimated Nutritional Needs:   Kcal:  2500-2700  Protein:  125-135g  Fluid:  2.5L/day   Clayton Bibles, MS, RD, LDN [facility] Pager: [phone] After Hours Pager: [phone]

## 2018-12-04 NOTE — Progress Notes (Signed)
CRITICAL VALUE ALERT  Critical Value:  WBC 0.5, HGB 6.8, PLT 12  Date & Time Notied:  12/04/2018 7:42 AM   Provider Notified: Dr Nevada Crane  Orders Received/Actions taken:

## 2018-12-04 NOTE — Progress Notes (Signed)
PROGRESS NOTE  [name] OZH:[id number] DOB: July 13, 1966 DOA: 12/01/2018 PCP: [name], MD  HPI/Recap of past 24 hours:  Andrew West is a 53 y.o. male with medical history significant of SCLC chemo x8 days ago, HTN, CAD. Patient presents to the ED with c/o multiple episodes of N/V for past couple of days.  Associated generalized weakness.  Generalized abd pain.  Poor PO intake.  ED Course: CT today shows ? RLL PNA.  WBC 0.9k, HGB 9.1, platelets 43.  12/02/2018: Patient seen and examined at bedside.  Ill-appearing reports pain at periumbilical region tender on palpation.  Also reports nausea with vomiting last night.  Poor oral intake due to persistent nausea.  12/03/2018: Patient seen and examined with his wife at bedside.  Reports chronic pain when he swallows, symptoms present for months.  Also persistent nausea with vomiting this morning.  Influenza B positive.  GI consulted to further assess odynophagia.  12/21/2018: Patient seen and examined his wife at bedside.  Still complains of odynophagia.  GI recommends G-tube placement and to allow for Plavix washout x5 days.    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 Given N/V, ANC 700, feel that its probably best to treat this as PNA for the moment. 2. Continue cefepime 3. Continue Tamiflu 75 mg twice daily day #2 x5 days 4. Blood cultures peripherally x2- to date  Severe odynophagia Followed by GI who suspects possible mediastinal adenopathy versus radiation therapy related stricture versus infective process.  Allow for washout of Plavix x5 days.  IR consulted for possible G-tube placement.  AKI on  CKD 2.  Appears to be at his baseline creatinine 1.3 with GFR greater than 60 Improving- likely pre-renal / dehydration from N/V - IVF  N/V / abd pain - 5. Supportive care 6. IVF 7. IV Zofran, IV Phenergan, compazine PRN nausea  Severe pancytopenia: Worsening WBC down to 2.5, hemoglobin 6.5, platelet 12 K.  Will transfuse hemoglobin and platelets.  Dr. Shirline Frees following.  Granix will be administered today.  Repeat CBC in the morning  Severe thrombocytopenia: Platelet 12 K.  Hold off aspirin and Plavix due to severe thrombocytopenia.  SCLC -heme oncology following  HTN -Blood pressure stable  CAD s/p multiple PCI's-Home meds include statin, ASA, plavix.  Hold off Plavix and aspirin due to severe thrombocytopenia and possible procedure G-tube placement.   Hypomagnesemia -post repletion  Cancer related pain.  Continue pain management and bowel regimen  DVT prophylaxis: SCDs - thrombocytopenia Code Status: Full Family Communication:  Wife at bedside.  All questions answered to her satisfaction. Disposition Plan:  Home when oncology and GI sign off. Consults called:  Oncology, GI   Objective: Vitals:   12/04/18 1345 12/04/18 1416 12/04/18 1453 12/04/18 1518  BP: (!) 141/94 (!) 135/91 (!) 137/94 132/89  Pulse: 89 (!) 101 95 99  Resp: 16 16 16 17   Temp: 98.7 F (37.1 C) 98.2 F (36.8 C) 98.1 F (36.7 C) 97.8 F (36.6 C)  TempSrc: Oral Oral Oral Oral  SpO2: 96% 96% 96% 98%  Weight:      Height:        Intake/Output  Summary (Last 24 hours) at 12/04/2018 1526 Last data filed at 12/04/2018 1507 Gross per 24 hour  Intake 3339.22 ml  Output 2100 ml  Net 1239.22 ml   Filed Weights   12/01/18 1836 12/02/18 0012  Weight: 90 kg 83.7 kg    Exam:  . General: 54 y.o. year-old male chronically ill-appearing.  Alert and oriented x3.  Appears uncomfortable due to odynophagia. . Cardiovascular: Regular rate and rhythm with no rubs or gallops.  No JVD or thyromegaly  noted. Marland Kitchen Respiratory: Clear to auscultation with no wheezes or rales.  Good inspiratory effort.  Abdomen: Soft nontender nondistended with normal bowel sounds x4 quadrants. . Musculoskeletal: Trace lower extremity edema. 2/4 pulses in all 4 extremities. Marland Kitchen Psychiatry: Mood is appropriate for condition and setting   Data Reviewed: CBC: Recent Labs  Lab 12/01/18 1918 12/02/18 0615 12/03/18 0800 12/04/18 0655  WBC 0.9* 0.8*  --  0.5*  NEUTROABS 0.7*  --   --   --   HGB 9.1* 7.5* 7.6* 6.8*  HCT 28.4* 24.0* 24.3* 21.3*  MCV 86.9 89.6  --  88.4  PLT 43* 31*  --  12*   Basic Metabolic Panel: Recent Labs  Lab 12/01/18 1918 12/02/18 0615 12/04/18 0655  NA 142 142  --   K 3.5 3.9  --   CL 105 108  --   CO2 25 25  --   GLUCOSE 88 73  --   BUN 32* 28*  --   CREATININE 1.70* 1.57* 1.39*  CALCIUM 8.7* 8.3*  --   MG 1.4*  --   --    GFR: Estimated Creatinine Clearance: 62.2 mL/min (A) (by C-G formula based on SCr of 1.39 mg/dL (H)). Liver Function Tests: Recent Labs  Lab 12/01/18 1918 12/02/18 0615  AST 20 16  ALT 21 16  ALKPHOS 64 48  BILITOT 0.7 0.8  PROT 6.8 5.6*  ALBUMIN 3.1* 2.7*   Recent Labs  Lab 12/01/18 1918  LIPASE 31   No results for input(s): AMMONIA in the last 168 hours. Coagulation Profile: No results for input(s): INR, PROTIME in the last 168 hours. Cardiac Enzymes: Recent Labs  Lab 12/01/18 1918  TROPONINI <0.03   BNP (last 3 results) No results for input(s): PROBNP in the last 8760 hours. HbA1C: No results for input(s): HGBA1C in the last 72 hours. CBG: No results for input(s): GLUCAP in the last 168 hours. Lipid Profile: No results for input(s): CHOL, HDL, LDLCALC, TRIG, CHOLHDL, LDLDIRECT in the last 72 hours. Thyroid Function Tests: No results for input(s): TSH, T4TOTAL, FREET4, T3FREE, THYROIDAB in the last 72 hours. Anemia Panel: No results for input(s): VITAMINB12, FOLATE, FERRITIN, TIBC, IRON, RETICCTPCT in the last 72 hours. Urine  analysis:    Component Value Date/Time   COLORURINE YELLOW 12/02/2018 0018   APPEARANCEUR HAZY (A) 12/02/2018 0018   LABSPEC 1.014 12/02/2018 0018   PHURINE 5.0 12/02/2018 0018   GLUCOSEU NEGATIVE 12/02/2018 0018   GLUCOSEU NEGATIVE 12/30/2008 1531   HGBUR NEGATIVE 12/02/2018 0018   BILIRUBINUR NEGATIVE 12/02/2018 0018   BILIRUBINUR small 10/10/2017 1455   KETONESUR 5 (A) 12/02/2018 0018   PROTEINUR NEGATIVE 12/02/2018 0018   UROBILINOGEN 1.0 10/10/2017 1455   UROBILINOGEN 1.0 01/23/2014 2333   NITRITE NEGATIVE 12/02/2018 0018   LEUKOCYTESUR NEGATIVE 12/02/2018 0018   Sepsis Labs: @LABRCNTIP (procalcitonin:4,lacticidven:4)  ) Recent Results (from the past 240 hour(s))  Culture, blood (routine x 2)     Status: None (Preliminary result)   Collection Time: 12/01/18  9:33 PM  Result Value Ref Range Status   Specimen Description   Final    BLOOD PORTA CATH Performed at [facility], [address], [place], [place] [address]    Special Requests   Final    BOTTLES DRAWN AEROBIC AND ANAEROBIC Blood Culture adequate volume Performed at [facility] [address], [place], [place] [address]    Culture   Final    NO GROWTH 2 DAYS Performed at [facility], [address] [place], [place] [address]    Report Status PENDING  Incomplete  Culture, blood (routine x 2)     Status: None (Preliminary result)   Collection Time: 12/02/18 12:00 AM  Result Value Ref Range Status   Specimen Description   Final    BLOOD RIGHT HAND Performed at [facility], [address], [place], [place] [address]    Special Requests   Final    BOTTLES DRAWN AEROBIC AND ANAEROBIC Blood Culture adequate volume Performed at [facility], [address] [place], [place] [address]    Culture   Final    NO GROWTH 2 DAYS Performed at [facility] [place] [facility], [address] [place], [place] [address]    Report Status PENDING  Incomplete   Urine culture     Status: None   Collection Time: 12/02/18 12:18 AM  Result Value Ref Range Status   Specimen Description URINE, CLEAN CATCH  Final   Special Requests   Final    NONE Performed at [facility], [address], [place], [place] [address]    Culture NO GROWTH  Final   Report Status 12/03/2018 FINAL  Final      Studies: No results found.  Scheduled Meds: . cholecalciferol  2,000 Units Oral Daily  . dronabinol  5 mg Oral BID AC  . FLUoxetine  40 mg Oral Daily  . mirtazapine  30 mg Oral QHS  . mometasone-formoterol  2 puff Inhalation BID  . oseltamivir  75 mg Oral BID  . pantoprazole  40 mg Oral Daily  . prazosin  1 mg Oral QHS  . PRUTECT   Topical BID  . ranolazine  500 mg Oral BID  . rosuvastatin  20 mg Oral Daily  . sucralfate  1 g Oral TID AC & HS  . Tbo-filgastrim (GRANIX) SQ  480 mcg Subcutaneous q1800  . umeclidinium bromide  1 puff Inhalation Daily  . vitamin B-12  500 mcg Oral Daily  . zolpidem  5 mg Oral QHS    Continuous Infusions: . sodium chloride 125 mL/hr at 12/04/18 1400  . ceFEPime (MAXIPIME) IV Stopped (12/04/18 0722)  . fluconazole (DIFLUCAN) IV Stopped (12/03/18 1725)     LOS: 2 days     Darlin Drop, MD [facility] Pager [phone]  If 7PM-7AM, please contact night-coverage [email or web address] Password [facility] 12/04/2018, 3:26 PM

## 2018-12-04 NOTE — Progress Notes (Signed)
Subjective: The patient is seen and examined today.  His wife was sleeping at the room at the time of the visit.  He is feeling fine today except for significant dysphagia and inability to eat or drink.  He was seen by Dr. Ardis Hughs from gastroenterology and he recommended PEG tube placement.  The patient denied having any current fever or chills.  He has no nausea, vomiting, diarrhea or constipation.  He denied having any chest pain, shortness of breath except with exertion with no cough or hemoptysis.  He was admitted recently with pancytopenia in addition to significant weakness and fatigue.  Objective: Vital signs in last 24 hours: Temp:  [98.4 F (36.9 C)-98.6 F (37 C)] 98.6 F (37 C) (01/13 0606) Pulse Rate:  [84-104] 88 (01/13 0606) Resp:  [16-18] 16 (01/13 0606) BP: (112-121)/(81-86) 112/86 (01/13 0606) SpO2:  [97 %-98 %] 98 % (01/13 0606)  Intake/Output from previous day: 01/12 0701 - 01/13 0700 In: 1804.4 [I.V.:1804.4] Out: 2200 [Urine:2200] Intake/Output this shift: No intake/output data recorded.  General appearance: alert, cooperative, fatigued and no distress Resp: clear to auscultation bilaterally Cardio: regular rate and rhythm, S1, S2 normal, no murmur, click, rub or gallop GI: soft, non-tender; bowel sounds normal; no masses,  no organomegaly Extremities: extremities normal, atraumatic, no cyanosis or edema  Lab Results:  Recent Labs    12/02/18 0615 12/03/18 0800 12/04/18 0655  WBC 0.8*  --  0.5*  HGB 7.5* 7.6* 6.8*  HCT 24.0* 24.3* 21.3*  PLT 31*  --  12*   BMET Recent Labs    12/01/18 1918 12/02/18 0615 12/04/18 0655  NA 142 142  --   K 3.5 3.9  --   CL 105 108  --   CO2 25 25  --   GLUCOSE 88 73  --   BUN 32* 28*  --   CREATININE 1.70* 1.57* 1.39*  CALCIUM 8.7* 8.3*  --     Studies/Results: No results found.  Medications: I have reviewed the patient's current medications.  Assessment/Plan: This is a very pleasant 53 years old  African-American male with limited stage small cell lung cancer diagnosed in October 2019.  The patient is undergoing a course of systemic chemotherapy with cisplatin and etoposide status post 4 cycles.  This was concurrent with radiation. The patient was admitted with chemotherapy-induced pancytopenia in addition to significant fatigue and weakness as well as abdominal pain and dysphagia. For the chemotherapy-induced neutropenia, I would recommend for the patient to start treatment with Granix 480 mcg subcutaneously daily until his absolute neutrophil count is over 1000. For the chemotherapy-induced anemia, he will receive 2 units of PRBCs transfusion today. For the chemotherapy-induced thrombocytopenia, the patient will receive 1 unit of platelets today. For the dysphasia and odynophagia, he is followed by gastroenterology and they recommended a PEG tube placement for nutrition. For the dehydration and weakness, continue with the IV fluids for now. Thank you so much for taking good care of Mr. Krinke, I will continue to follow up the patient with you and assist in his management on as-needed basis.   LOS: 2 days    Eilleen Kempf 12/04/2018

## 2018-12-05 ENCOUNTER — Inpatient Hospital Stay: Payer: Medicare Other | Admitting: Nutrition

## 2018-12-05 ENCOUNTER — Inpatient Hospital Stay: Payer: Medicare Other

## 2018-12-05 ENCOUNTER — Inpatient Hospital Stay: Payer: Medicare Other | Admitting: Internal Medicine

## 2018-12-05 LAB — TYPE AND SCREEN
ABO/RH(D): A POS
Antibody Screen: NEGATIVE
Unit division: 0
Unit division: 0

## 2018-12-05 LAB — PROTIME-INR
INR: 1.08
Prothrombin Time: 13.9 seconds (ref 11.4–15.2)

## 2018-12-05 LAB — BPAM RBC
Blood Product Expiration Date: 202002012359
Blood Product Expiration Date: 202002042359
ISSUE DATE / TIME: 202001131457
ISSUE DATE / TIME: 202001132039
Unit Type and Rh: 6200
Unit Type and Rh: 6200

## 2018-12-05 LAB — PREPARE PLATELET PHERESIS: Unit division: 0

## 2018-12-05 LAB — BASIC METABOLIC PANEL
Anion gap: 11 (ref 5–15)
BUN: 14 mg/dL (ref 6–20)
CO2: 24 mmol/L (ref 22–32)
Calcium: 8.3 mg/dL — ABNORMAL LOW (ref 8.9–10.3)
Chloride: 110 mmol/L (ref 98–111)
Creatinine, Ser: 1.36 mg/dL — ABNORMAL HIGH (ref 0.61–1.24)
GFR, EST NON AFRICAN AMERICAN: 59 mL/min — AB (ref >60)
Glucose, Bld: 66 mg/dL — ABNORMAL LOW (ref 70–99)
Potassium: 3 mmol/L — ABNORMAL LOW (ref 3.5–5.1)
Sodium: 145 mmol/L (ref 135–145)

## 2018-12-05 LAB — CBC
HCT: 27.3 % — ABNORMAL LOW (ref 39.0–52.0)
Hemoglobin: 8.9 g/dL — ABNORMAL LOW (ref 13.0–17.0)
MCH: 28.6 pg (ref 26.0–34.0)
MCHC: 32.6 g/dL (ref 30.0–36.0)
MCV: 87.8 fL (ref 80.0–100.0)
Platelets: 40 10*3/uL — ABNORMAL LOW (ref 150–400)
RBC: 3.11 MIL/uL — ABNORMAL LOW (ref 4.22–5.81)
RDW: 14.6 % (ref 11.5–15.5)
WBC: 0.4 10*3/uL — CL (ref 4.0–10.5)
nRBC: 0 % (ref 0.0–0.2)

## 2018-12-05 LAB — BPAM PLATELET PHERESIS
Blood Product Expiration Date: 202001142359
ISSUE DATE / TIME: 202001131353
Unit Type and Rh: 6200

## 2018-12-05 MED ORDER — POTASSIUM CHLORIDE CRYS ER 20 MEQ PO TBCR: 40.0000 meq | EXTENDED_RELEASE_TABLET | Freq: Two times a day (BID) | ORAL | Status: DC

## 2018-12-05 MED ORDER — METOPROLOL TARTRATE 5 MG/5ML IV SOLN: 5.0000 mg | Freq: Three times a day (TID) | INTRAVENOUS | Status: DC

## 2018-12-05 MED ORDER — POTASSIUM CHLORIDE 10 MEQ/100ML IV SOLN
10.0000 meq | INTRAVENOUS | Status: AC
  Administered 2018-12-05 (×6): 10 meq via INTRAVENOUS
  Filled 2018-12-05 (×4): qty 100

## 2018-12-05 MED ORDER — KCL IN DEXTROSE-NACL 10-5-0.45 MEQ/L-%-% IV SOLN
INTRAVENOUS | Status: DC
  Administered 2018-12-05: 17:00:00 via INTRAVENOUS
  Filled 2018-12-05 (×2): qty 1000

## 2018-12-05 MED ORDER — PANTOPRAZOLE SODIUM 40 MG IV SOLR
40.0000 mg | INTRAVENOUS | Status: DC
  Administered 2018-12-05 – 2018-12-08 (×3): 40 mg via INTRAVENOUS
  Filled 2018-12-05 (×3): qty 40

## 2018-12-05 MED ORDER — METOPROLOL TARTRATE 5 MG/5ML IV SOLN
2.5000 mg | Freq: Three times a day (TID) | INTRAVENOUS | Status: DC
  Administered 2018-12-05 – 2018-12-09 (×10): 2.5 mg via INTRAVENOUS
  Filled 2018-12-05 (×10): qty 5

## 2018-12-05 MED ORDER — MAGNESIUM SULFATE 2 GM/50ML IV SOLN
2.0000 g | Freq: Once | INTRAVENOUS | Status: AC
  Administered 2018-12-05: 2 g via INTRAVENOUS
  Filled 2018-12-05: qty 50

## 2018-12-05 NOTE — Care Management Important Message (Signed)
Important Message  Patient Details  Name: Andrew West MRN: [id number] Date of Birth: 16-Jan-1966   Medicare Important Message Given:  Yes    [name] 12/05/2018, 10:36 AM

## 2018-12-05 NOTE — Progress Notes (Signed)
Patient ID: Andrew West, male   DOB: August 11, 1966, 53 y.o.   MRN: [id number] Aware of request for gastrostomy tube placement in patient.  Case has been reviewed by Dr. Kathlene Cote.  Patient is markedly neutropenic and thrombocytopenic.  Last dose of Plavix was on 1/12.  Would need WBC of at least 2 and likely platelet transfusion prior to consideration of gastrostomy tube.  Patient would also need to be off Plavix for total of 5 days.  Continue to monitor. Please page Dr. Kathlene Cote at [phone] with any additional questions.

## 2018-12-05 NOTE — H&P (View-Only) (Signed)
[facility] Progress Note    Since last GI note: Still with severe swallowing difficulty.  Seems to manage his secretions most of the time though.  Objective: Vital signs in last 24 hours: Temp:  [97.8 F (36.6 C)-98.7 F (37.1 C)] 98.4 F (36.9 C) (01/14 0454) Pulse Rate:  [73-101] 76 (01/14 0454) Resp:  [15-18] 15 (01/14 0454) BP: (132-150)/(89-99) 132/98 (01/14 0454) SpO2:  [94 %-98 %] 96 % (01/14 0454) FiO2 (%):  [21 %] 21 % (01/13 1722) Last BM Date: 11/30/18 General: alert and oriented times 3 Heart: regular rate and rythm Abdomen: soft, non-tender, non-distended, normal bowel sounds   Lab Results: Recent Labs    12/03/18 0800 12/04/18 0655 12/05/18 0553  WBC  --  0.5* 0.4*  HGB 7.6* 6.8* 8.9*  PLT  --  12* 40*  MCV  --  88.4 87.8   Recent Labs    12/04/18 0655 12/05/18 0553  NA  --  145  K  --  3.0*  CL  --  110  CO2  --  24  GLUCOSE  --  66*  BUN  --  14  CREATININE 1.39* 1.36*  CALCIUM  --  8.3*    Medications: Scheduled Meds: . cholecalciferol  2,000 Units Oral Daily  . dronabinol  5 mg Oral BID AC  . FLUoxetine  40 mg Oral Daily  . mirtazapine  30 mg Oral QHS  . mometasone-formoterol  2 puff Inhalation BID  . oseltamivir  75 mg Oral BID  . pantoprazole sodium  40 mg Oral Daily  . prazosin  1 mg Oral QHS  . PRUTECT   Topical BID  . ranolazine  500 mg Oral BID  . rosuvastatin  20 mg Oral Daily  . sucralfate  1 g Oral TID WC & HS  . Tbo-filgastrim (GRANIX) SQ  480 mcg Subcutaneous q1800  . umeclidinium bromide  1 puff Inhalation Daily  . vitamin B-12  500 mcg Oral Daily  . zolpidem  5 mg Oral QHS   Continuous Infusions: . sodium chloride 125 mL/hr at 12/04/18 1400  . ceFEPime (MAXIPIME) IV 2 g (12/05/18 0602)  . fluconazole (DIFLUCAN) IV 100 mg (12/04/18 1726)  . magnesium sulfate 1 - 4 g bolus IVPB    . potassium chloride     PRN Meds:.acetaminophen **OR** acetaminophen, albuterol, benzonatate, diphenhydrAMINE,  HYDROcodone-acetaminophen, HYDROmorphone (DILAUDID) injection, ondansetron **OR** ondansetron (ZOFRAN) IV, phenol, prochlorperazine, promethazine    Assessment/Plan: 53 y.o. male odynophagia, dysphagia  His swallowing difficulty ddx is mediastinal adenopathy, XRT related stricture, infectious.  May be a combination of the three.  He is pancytopenic from chemo and so EGD is not currently safe. Also, last plavix was two days ago (12/03/2018).    I recommend starting TNA, it's bee a long time since he's had adequate calories.  I note that IR was consulted for G tube placement, appreciate their assistance.   Continue diflucan IV (perhaps he has fungal esophagitis despite no oral thrush).  Will plan on EGD as soon as it is safe (Possibly Thursday or Friday) after 5 day plavix washout and resolution of his neutropenia.  Milus Banister, MD  12/05/2018, 7:25 AM [facility] Pager [phone]

## 2018-12-05 NOTE — Progress Notes (Signed)
[facility] Progress Note    Since last GI note: Still with severe swallowing difficulty.  Seems to manage his secretions most of the time though.  Objective: Vital signs in last 24 hours: Temp:  [97.8 F (36.6 C)-98.7 F (37.1 C)] 98.4 F (36.9 C) (01/14 0454) Pulse Rate:  [73-101] 76 (01/14 0454) Resp:  [15-18] 15 (01/14 0454) BP: (132-150)/(89-99) 132/98 (01/14 0454) SpO2:  [94 %-98 %] 96 % (01/14 0454) FiO2 (%):  [21 %] 21 % (01/13 1722) Last BM Date: 11/30/18 General: alert and oriented times 3 Heart: regular rate and rythm Abdomen: soft, non-tender, non-distended, normal bowel sounds   Lab Results: Recent Labs    12/03/18 0800 12/04/18 0655 12/05/18 0553  WBC  --  0.5* 0.4*  HGB 7.6* 6.8* 8.9*  PLT  --  12* 40*  MCV  --  88.4 87.8   Recent Labs    12/04/18 0655 12/05/18 0553  NA  --  145  K  --  3.0*  CL  --  110  CO2  --  24  GLUCOSE  --  66*  BUN  --  14  CREATININE 1.39* 1.36*  CALCIUM  --  8.3*    Medications: Scheduled Meds: . cholecalciferol  2,000 Units Oral Daily  . dronabinol  5 mg Oral BID AC  . FLUoxetine  40 mg Oral Daily  . mirtazapine  30 mg Oral QHS  . mometasone-formoterol  2 puff Inhalation BID  . oseltamivir  75 mg Oral BID  . pantoprazole sodium  40 mg Oral Daily  . prazosin  1 mg Oral QHS  . PRUTECT   Topical BID  . ranolazine  500 mg Oral BID  . rosuvastatin  20 mg Oral Daily  . sucralfate  1 g Oral TID WC & HS  . Tbo-filgastrim (GRANIX) SQ  480 mcg Subcutaneous q1800  . umeclidinium bromide  1 puff Inhalation Daily  . vitamin B-12  500 mcg Oral Daily  . zolpidem  5 mg Oral QHS   Continuous Infusions: . sodium chloride 125 mL/hr at 12/04/18 1400  . ceFEPime (MAXIPIME) IV 2 g (12/05/18 0602)  . fluconazole (DIFLUCAN) IV 100 mg (12/04/18 1726)  . magnesium sulfate 1 - 4 g bolus IVPB    . potassium chloride     PRN Meds:.acetaminophen **OR** acetaminophen, albuterol, benzonatate, diphenhydrAMINE,  HYDROcodone-acetaminophen, HYDROmorphone (DILAUDID) injection, ondansetron **OR** ondansetron (ZOFRAN) IV, phenol, prochlorperazine, promethazine    Assessment/Plan: 53 y.o. male odynophagia, dysphagia  His swallowing difficulty ddx is mediastinal adenopathy, XRT related stricture, infectious.  May be a combination of the three.  He is pancytopenic from chemo and so EGD is not currently safe. Also, last plavix was two days ago (12/03/2018).    I recommend starting TNA, it's bee a long time since he's had adequate calories.  I note that IR was consulted for G tube placement, appreciate their assistance.   Continue diflucan IV (perhaps he has fungal esophagitis despite no oral thrush).  Will plan on EGD as soon as it is safe (Possibly Thursday or Friday) after 5 day plavix washout and resolution of his neutropenia.  Milus Banister, MD  12/05/2018, 7:25 AM [facility] Pager [phone]

## 2018-12-05 NOTE — Progress Notes (Addendum)
PROGRESS NOTE  Andrew West WUJ:[id number] DOB: 01-Apr-1966 DOA: 12/01/2018 PCP: [name], MD  HPI/Recap of past 24 hours: Andrew West is a 53 y.o. male with medical history significant of SCLC chemo x8 days ago, HTN, CAD. Patient presents to the ED with c/o multiple episodes of N/V for past couple of days.  Associated generalized weakness.  Generalized abd pain.  Poor PO intake.  ED Course: CT today shows ? RLL PNA.  WBC 0.9k, HGB 9.1, platelets 43K.  Hospital course complicated by significant severe pancytopenia requiring RBCs and platelet transfusion and Granix infusion, odynophagia with recommendation for PEG tube by GI, and influenza B infection.   12/05/2018: Patient seen and examined at his bedside.  Reports persistent odynophagia.  Started on D5 half-normal with 10 mEq potassium chloride.   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 Given N/V and significant neutropenia, feel that its probably best to treat this as PNA for the moment. 2. Continue cefepime 3. Continue Tamiflu 75 mg twice daily day #3 x5 days 4. Blood cultures peripherally x2- to date  Severe odynophagia Followed by GI who suspects possible mediastinal adenopathy versus radiation therapy related stricture versus infective process.  Allow for washout of Plavix x5 days, day 2.  IR consulted for possible G-tube placement. Hold oral antihypertensive and start Iv lopressor 2.5 mg TID. Switch po protonix to IV 40 mg daily.  AKI on CKD 2.  Appears to be at his baseline creatinine 1.3 with GFR greater than 60 Improving- likely pre-renal / dehydration from N/V - IVF  N/V / abd pain  - 5. Supportive care 6. IVF 7. IV Zofran, IV Phenergan, compazine PRN nausea  Severe pancytopenia: Worsening WBC down to 0.4, hemoglobin 8.9 post 2 unit PRBC transfusion from 6.5, platelet 40 K post platelet transfusion.  Continue Granix as recommended by oncology.  Dr. Shirline Frees following.  Granted started on 12/04/2018.  Repeat CBC in the morning  Severe thrombocytopenia, improving posttransfusion: Platelet 40 K.  Hold off aspirin and Plavix due to severe thrombocytopenia.  SCLC -heme oncology following  HTN -Blood pressure stable  CAD s/p multiple PCI's-Home meds include statin, ASA, plavix.  Hold off Plavix and aspirin due to severe thrombocytopenia and possible procedure G-tube placement.   Hypomagnesemia -post repletion  Cancer related pain.  Continue pain management and bowel regimen  Uncontrolled HTN: Hold oral meds due to odynophagia and start IV lopressor 2.5 mg TID. C/ to monitor vital signs closely.  GERD: Hold oral protonix and start IV protonix 40 mg daily.  DVT prophylaxis: SCDs - thrombocytopenia Code Status: Full Family Communication:  Wife at bedside.  All questions answered to her satisfaction. Disposition Plan:  Home when oncology and GI sign off. Consults called:  Oncology, GI   Objective: Vitals:   12/04/18 2118 12/04/18 2353 12/05/18 0454 12/05/18 0959  BP: (!) 140/95 (!) 146/91 (!) 132/98   Pulse: 86 75 76   Resp: 16 16 15    Temp: 98.5 F (36.9 C) 98.7 F (37.1 C) 98.4 F (36.9 C)   TempSrc: Oral Oral Oral   SpO2: 97% 97% 96% 96%  Weight:  Height:        Intake/Output Summary (Last 24 hours) at 12/05/2018 1459 Last data filed at 12/05/2018 1102 Gross per 24 hour  Intake 3045.44 ml  Output 3250 ml  Net -204.56 ml   Filed Weights   12/01/18 1836 12/02/18 0012  Weight: 90 kg 83.7 kg    Exam:  . General: 53 y.o. year-old male chronically ill-appearing.  Appears uncomfortable due to odynophagia.  Alert and oriented x3.    . Cardiovascular: Regular rate and rhythm with no rubs or gallops.  No JVD or thyromegaly. Marland Kitchen Respiratory: Clear to auscultation with no wheezes or rales.  Poor inspiratory effort.  Abdomen: Soft nontender nondistended with normal bowel sounds x4 quadrants. . Musculoskeletal: Trace lower extremity edema. 2/4 pulses in all 4 extremities. Marland Kitchen Psychiatry: Mood is appropriate for condition and setting   Data Reviewed: CBC: Recent Labs  Lab 12/01/18 1918 12/02/18 0615 12/03/18 0800 12/04/18 0655 12/05/18 0553  WBC 0.9* 0.8*  --  0.5* 0.4*  NEUTROABS 0.7*  --   --   --   --   HGB 9.1* 7.5* 7.6* 6.8* 8.9*  HCT 28.4* 24.0* 24.3* 21.3* 27.3*  MCV 86.9 89.6  --  88.4 87.8  PLT 43* 31*  --  12* 40*   Basic Metabolic Panel: Recent Labs  Lab 12/01/18 1918 12/02/18 0615 12/04/18 0655 12/05/18 0553  NA 142 142  --  145  K 3.5 3.9  --  3.0*  CL 105 108  --  110  CO2 25 25  --  24  GLUCOSE 88 73  --  66*  BUN 32* 28*  --  14  CREATININE 1.70* 1.57* 1.39* 1.36*  CALCIUM 8.7* 8.3*  --  8.3*  MG 1.4*  --   --   --    GFR: Estimated Creatinine Clearance: 63.5 mL/min (A) (by C-G formula based on SCr of 1.36 mg/dL (H)). Liver Function Tests: Recent Labs  Lab 12/01/18 1918 12/02/18 0615  AST 20 16  ALT 21 16  ALKPHOS 64 48  BILITOT 0.7 0.8  PROT 6.8 5.6*  ALBUMIN 3.1* 2.7*   Recent Labs  Lab 12/01/18 1918  LIPASE 31   No results for input(s): AMMONIA in the last 168 hours. Coagulation Profile: Recent Labs  Lab 12/05/18 1242  INR 1.08   Cardiac Enzymes: Recent Labs  Lab 12/01/18 1918  TROPONINI <0.03   BNP (last 3 results) No results for input(s): PROBNP in the last 8760 hours. HbA1C: No results for input(s): HGBA1C in the last 72 hours. CBG: No results for input(s): GLUCAP in the last 168 hours. Lipid Profile: No results for input(s): CHOL, HDL, LDLCALC, TRIG, CHOLHDL, LDLDIRECT in the last 72 hours. Thyroid Function Tests: No results for input(s): TSH, T4TOTAL,  FREET4, T3FREE, THYROIDAB in the last 72 hours. Anemia Panel: No results for input(s): VITAMINB12, FOLATE, FERRITIN, TIBC, IRON, RETICCTPCT in the last 72 hours. Urine analysis:    Component Value Date/Time   COLORURINE YELLOW 12/02/2018 0018   APPEARANCEUR HAZY (A) 12/02/2018 0018   LABSPEC 1.014 12/02/2018 0018   PHURINE 5.0 12/02/2018 0018   GLUCOSEU NEGATIVE 12/02/2018 0018   GLUCOSEU NEGATIVE 12/30/2008 1531   HGBUR NEGATIVE 12/02/2018 0018   BILIRUBINUR NEGATIVE 12/02/2018 0018   BILIRUBINUR small 10/10/2017 1455   KETONESUR 5 (A) 12/02/2018 0018   PROTEINUR NEGATIVE 12/02/2018 0018   UROBILINOGEN 1.0 10/10/2017 1455   UROBILINOGEN 1.0 01/23/2014 2333   NITRITE NEGATIVE 12/02/2018 0018   LEUKOCYTESUR NEGATIVE 12/02/2018 0018  Sepsis Labs: @LABRCNTIP (procalcitonin:4,lacticidven:4)  ) Recent Results (from the past 240 hour(s))  Culture, blood (routine x 2)     Status: None (Preliminary result)   Collection Time: 12/01/18  9:33 PM  Result Value Ref Range Status   Specimen Description   Final    BLOOD PORTA CATH Performed at [facility], [address], [place], [place] [address]    Special Requests   Final    BOTTLES DRAWN AEROBIC AND ANAEROBIC Blood Culture adequate volume Performed at [facility], [address], [place], [place] [address]    Culture   Final    NO GROWTH 3 DAYS Performed at [facility], [address], [place], [place] [address]    Report Status PENDING  Incomplete  Culture, blood (routine x 2)     Status: None (Preliminary result)   Collection Time: 12/02/18 12:00 AM  Result Value Ref Range Status   Specimen Description   Final    BLOOD RIGHT HAND Performed at [facility], [address], [place], [place] [address]    Special Requests   Final    BOTTLES DRAWN AEROBIC AND ANAEROBIC Blood Culture adequate volume Performed at [facility], [address], [place],  [place] [address]    Culture   Final    NO GROWTH 3 DAYS Performed at [facility], [address] [place], [place] [address]    Report Status PENDING  Incomplete  Urine culture     Status: None   Collection Time: 12/02/18 12:18 AM  Result Value Ref Range Status   Specimen Description URINE, CLEAN CATCH  Final   Special Requests   Final    NONE Performed at [facility], [address] [place], [place] [address]    Culture NO GROWTH  Final   Report Status 12/03/2018 FINAL  Final      Studies: No results found.  Scheduled Meds: . cholecalciferol  2,000 Units Oral Daily  . dronabinol  5 mg Oral BID AC  . FLUoxetine  40 mg Oral Daily  . mirtazapine  30 mg Oral QHS  . mometasone-formoterol  2 puff Inhalation BID  . oseltamivir  75 mg Oral BID  . pantoprazole sodium  40 mg Oral Daily  . prazosin  1 mg Oral QHS  . PRUTECT   Topical BID  . ranolazine  500 mg Oral BID  . rosuvastatin  20 mg Oral Daily  . sucralfate  1 g Oral TID WC & HS  . Tbo-filgastrim (GRANIX) SQ  480 mcg Subcutaneous q1800  . umeclidinium bromide  1 puff Inhalation Daily  . vitamin B-12  500 mcg Oral Daily  . zolpidem  5 mg Oral QHS    Continuous Infusions: . ceFEPime (MAXIPIME) IV 2 g (12/05/18 0602)  . dextrose 5 % and 0.45 % NaCl with KCl 10 mEq/L    . fluconazole (DIFLUCAN) IV 100 mg (12/05/18 1453)     LOS: 3 days     Andrew Drop, MD [facility] Pager [phone]  If 7PM-7AM, please contact night-coverage [email or web address] Password [facility] 12/05/2018, 2:59 PM

## 2018-12-06 ENCOUNTER — Inpatient Hospital Stay: Payer: Medicare Other

## 2018-12-06 DIAGNOSIS — E440 Moderate protein-calorie malnutrition: Secondary | ICD-10-CM

## 2018-12-06 LAB — CBC WITH DIFFERENTIAL/PLATELET
Abs Immature Granulocytes: 0.02 10*3/uL (ref 0.00–0.07)
Basophils Absolute: 0 10*3/uL (ref 0.0–0.1)
Basophils Relative: 0 %
EOS ABS: 0 10*3/uL (ref 0.0–0.5)
Eosinophils Relative: 3 %
HCT: 28.3 % — ABNORMAL LOW (ref 39.0–52.0)
Hemoglobin: 9.3 g/dL — ABNORMAL LOW (ref 13.0–17.0)
Immature Granulocytes: 5 %
Lymphocytes Relative: 41 %
Lymphs Abs: 0.2 10*3/uL — ABNORMAL LOW (ref 0.7–4.0)
MCH: 28.1 pg (ref 26.0–34.0)
MCHC: 32.9 g/dL (ref 30.0–36.0)
MCV: 85.5 fL (ref 80.0–100.0)
Monocytes Absolute: 0.1 10*3/uL (ref 0.1–1.0)
Monocytes Relative: 32 %
NEUTROS PCT: 19 %
Neutro Abs: 0.1 10*3/uL — ABNORMAL LOW (ref 1.7–7.7)
Platelets: 30 10*3/uL — ABNORMAL LOW (ref 150–400)
RBC: 3.31 MIL/uL — ABNORMAL LOW (ref 4.22–5.81)
RDW: 14.8 % (ref 11.5–15.5)
WBC: 0.4 10*3/uL — CL (ref 4.0–10.5)
nRBC: 0 % (ref 0.0–0.2)

## 2018-12-06 LAB — BASIC METABOLIC PANEL
Anion gap: 12 (ref 5–15)
BUN: 12 mg/dL (ref 6–20)
CO2: 24 mmol/L (ref 22–32)
Calcium: 8.5 mg/dL — ABNORMAL LOW (ref 8.9–10.3)
Chloride: 106 mmol/L (ref 98–111)
Creatinine, Ser: 1.33 mg/dL — ABNORMAL HIGH (ref 0.61–1.24)
GFR calc Af Amer: >60 mL/min (ref >60)
GFR calc non Af Amer: >60 mL/min (ref >60)
Glucose, Bld: 95 mg/dL (ref 70–99)
Potassium: 3.1 mmol/L — ABNORMAL LOW (ref 3.5–5.1)
Sodium: 142 mmol/L (ref 135–145)

## 2018-12-06 MED ORDER — POTASSIUM CHLORIDE 10 MEQ/50ML IV SOLN
10.0000 meq | INTRAVENOUS | Status: AC
  Administered 2018-12-06 (×4): 10 meq via INTRAVENOUS
  Filled 2018-12-06 (×4): qty 50

## 2018-12-06 MED ORDER — HYDRALAZINE HCL 20 MG/ML IJ SOLN
10.0000 mg | Freq: Once | INTRAMUSCULAR | Status: AC
  Administered 2018-12-06: 10 mg via INTRAVENOUS
  Filled 2018-12-06: qty 1

## 2018-12-06 MED ORDER — HYDROMORPHONE HCL 2 MG/ML IJ SOLN
1.5000 mg | INTRAMUSCULAR | Status: DC | PRN
  Administered 2018-12-06 – 2018-12-12 (×32): 1.5 mg via INTRAVENOUS
  Filled 2018-12-06 (×33): qty 1

## 2018-12-06 NOTE — Progress Notes (Signed)
PROGRESS NOTE    Andrew West  ZOX:[id number] DOB: 1966/08/25 DOA: 12/01/2018 PCP: [name], MD   Brief Narrative:  53 year old with past medical history relevant for small cell lung cancer on chemotherapy and radiation (completed radiation, last chemo on 11/23/2018 with cisplatin), COPD, BPH, stage III CKD, grade 1 diastolic dysfunction by echo on 02/26/2018, coronary artery disease status post stents (multiple vessel disease by cardiac catheterization on 12/16/2016, felt to be poor candidate for further intervention except for possibly CABG), who presents with nausea, vomiting, failure to thrive and diminished p.o. intake and found to have influenza B and possible right lower lobe pneumonia.   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  GI seen him and is considering an EGD once he is off clopidogrel for 5 days and is no longer pancytopenic.  Additionally he is pending placement of a G-tube. -Clear liquid diet by mouth as tolerated -We will discuss with nutrition about TPN - Off clopidogrel since 12/03/2018, 5 days will be 12/08/2018  #) Influenza B/right lower lobe pneumonia: Unclear how much contributing to his current presentation is influenza B and this possible pneumonia.   The pneumonia is almost certainly postobstructive. -Continue IV cefepime started 12/02/2018 -Continue oseltamivir started 12/03/2018 -Blood cultures from 12/02/2018 no growth to date  #) Pancytopenia: Secondary to chemotherapy -Hold clopidogrel and subcutaneous prophylaxis while platelets are below 50  #) Hypertension/hyperlipidemia: Currently patient has difficulty taking pills by mouth -Hold amlodipine 10 mg daily - Hold isosorbide mononitrate 120 mg daily -Hold nebivolol 20 mg daily - Continue rosuvastatin 20 mg daily - Hold hydralazine 25 mg twice daily -Continue IV metoprolol 2.5 mg every 8 hours  #) Coronary artery disease status post stent: Patient apparently is no longer a candidate for any nonsurgical interventions. -Continue aspirin 81 mg daily -Hold clopidogrel 75 mg daily -Continue beta-blocker, long-acting nitrate - Continue statin -Continue ranolazine thousand milligrams twice daily  #) COPD: -Continue L AMA/ICS/LABA -Continue PRN short-acting bronchodilators  #) Small cell lung cancer: Chemotherapy was on 11/23/2018 - consult oncology, Dr. Shirline Frees  #) BPH: -Continue prazosin  #) Pain/psych: -Continue dronabinol -Continue fluoxetine 40 mg daily -Continue prazosin 1 mg nightly  Fluids: Gentle IV fluids Electrolytes: Monitor and supplement Nutrition: Per above  Prophylaxis: Thrombocytopenic   Disposition: Pending recovery of counts and PEG tube placement  Full code   Consultants:   Interventional radiology  Oncology, Dr. Shirline Frees  Procedures:   None  Antimicrobials:   IV cefepime started 12/02/2018  P.o. oseltamivir started 12/03/2018   Subjective: This morning the patient reports that his pain is fairly bad in his throat.  He otherwise denies any nausea, vomiting, diarrhea, chest pain, cough, congestion, rhinorrhea.  Objective: Vitals:   12/05/18 2058 12/06/18 0159 12/06/18 0638 12/06/18 0807  BP: (!) 160/103 Marland Kitchen)  131/103 (!) 132/93    Pulse: 70 95 99   Resp: 20 16 20    Temp: 98.4 F (36.9 C) 98.9 F (37.2 C) 99 F (37.2 C)   TempSrc: Oral Oral Oral   SpO2: 97% 96% 96% 96%  Weight:      Height:        Intake/Output Summary (Last 24 hours) at 12/06/2018 1012 Last data filed at 12/06/2018 0949 Gross per 24 hour  Intake 1196.37 ml  Output 2250 ml  Net -1053.63 ml   Filed Weights   12/01/18 1836 12/02/18 0012  Weight: 90 kg 83.7 kg    Examination:  General exam: Appears calm and comfortable  Respiratory system: Clear to auscultation. Respiratory effort normal.  Intermittent wheezes at bilateral lung bases worse on right Cardiovascular system: Regular rate and rhythm, no murmurs Gastrointestinal system: Soft, nondistended, no rebound or guarding, plus bowel sounds Central nervous system: Alert and oriented.  Grossly intact, moving all extremities Extremities: No lower extremity edema. Skin: Port site is clean dry and intact Psychiatry: Judgement and insight appear normal. Mood & affect appropriate.     Data Reviewed: I have personally reviewed following labs and imaging studies  CBC: Recent Labs  Lab 12/01/18 1918 12/02/18 0615 12/03/18 0800 12/04/18 0655 12/05/18 0553 12/06/18 0521  WBC 0.9* 0.8*  --  0.5* 0.4* 0.4*  NEUTROABS 0.7*  --   --   --   --  0.1*  HGB 9.1* 7.5* 7.6* 6.8* 8.9* 9.3*  HCT 28.4* 24.0* 24.3* 21.3* 27.3* 28.3*  MCV 86.9 89.6  --  88.4 87.8 85.5  PLT 43* 31*  --  12* 40* 30*   Basic Metabolic Panel: Recent Labs  Lab 12/01/18 1918 12/02/18 0615 12/04/18 0655 12/05/18 0553 12/06/18 0521  NA 142 142  --  145 142  K 3.5 3.9  --  3.0* 3.1*  CL 105 108  --  110 106  CO2 25 25  --  24 24  GLUCOSE 88 73  --  66* 95  BUN 32* 28*  --  14 12  CREATININE 1.70* 1.57* 1.39* 1.36* 1.33*  CALCIUM 8.7* 8.3*  --  8.3* 8.5*  MG 1.4*  --   --   --   --    GFR: Estimated Creatinine Clearance: 65 mL/min (A) (by C-G formula based on SCr of 1.33 mg/dL (H)). Liver Function  Tests: Recent Labs  Lab 12/01/18 1918 12/02/18 0615  AST 20 16  ALT 21 16  ALKPHOS 64 48  BILITOT 0.7 0.8  PROT 6.8 5.6*  ALBUMIN 3.1* 2.7*   Recent Labs  Lab 12/01/18 1918  LIPASE 31   No results for input(s): AMMONIA in the last 168 hours. Coagulation Profile: Recent Labs  Lab 12/05/18 1242  INR 1.08   Cardiac Enzymes: Recent Labs  Lab 12/01/18 1918  TROPONINI <0.03   BNP (last 3 results) No results for input(s): PROBNP in the last 8760 hours. HbA1C: No results for input(s): HGBA1C in the last 72 hours. CBG: No results for input(s): GLUCAP in the last 168 hours. Lipid Profile: No results for input(s): CHOL, HDL, LDLCALC, TRIG, CHOLHDL, LDLDIRECT in the last 72 hours. Thyroid Function Tests: No results for input(s): TSH, T4TOTAL, FREET4, T3FREE, THYROIDAB in the last 72 hours. Anemia Panel: No results for input(s): VITAMINB12, FOLATE, FERRITIN, TIBC, IRON, RETICCTPCT in the last 72 hours. Sepsis Labs: Recent Labs  Lab 12/02/18 0619  PROCALCITON <0.10    Recent Results (from the past 240 hour(s))  Culture, blood (routine x 2)     Status: None (Preliminary result)   Collection Time: 12/01/18  9:33 PM  Result Value Ref Range Status   Specimen Description   Final    BLOOD PORTA CATH Performed at [facility], [address] [place], [place] [address]    Special Requests   Final    BOTTLES DRAWN AEROBIC AND ANAEROBIC Blood Culture adequate volume Performed at [facility], [address], [place], [place] [address]    Culture   Final    NO GROWTH 4 DAYS Performed at [facility], [address], [place], [place] [address]    Report Status PENDING  Incomplete  Culture, blood (routine x 2)     Status: None (Preliminary result)   Collection Time: 12/02/18 12:00 AM  Result Value Ref Range Status   Specimen Description   Final    BLOOD RIGHT HAND Performed at [facility], [address]  [place], [place] [address]    Special Requests   Final    BOTTLES DRAWN AEROBIC AND ANAEROBIC Blood Culture adequate volume Performed at [facility], [address] [place], [place] [address]    Culture   Final    NO GROWTH 4 DAYS Performed at [facility], [address], [place], [place] [address]    Report Status PENDING  Incomplete  Urine culture     Status: None   Collection Time: 12/02/18 12:18 AM  Result Value Ref Range Status   Specimen Description URINE, CLEAN CATCH  Final   Special Requests   Final    NONE Performed at [facility], [address], [place], [place] [address]    Culture NO GROWTH  Final   Report Status 12/03/2018 FINAL  Final         Radiology Studies: No results found.      Scheduled Meds: . cholecalciferol  2,000 Units Oral Daily  . dronabinol  5 mg Oral BID AC  . FLUoxetine  40 mg Oral Daily  . metoprolol tartrate  2.5 mg Intravenous Q8H  . mirtazapine  30 mg Oral QHS  . mometasone-formoterol  2 puff Inhalation BID  . oseltamivir  75 mg Oral BID  . pantoprazole (PROTONIX) IV  40 mg Intravenous Q24H  . prazosin  1 mg Oral QHS  . PRUTECT   Topical BID  . ranolazine  500 mg Oral BID  . rosuvastatin  20 mg Oral Daily  . sucralfate  1 g Oral TID WC & HS  . Tbo-filgastrim (GRANIX) SQ  480 mcg Subcutaneous q1800  . umeclidinium bromide  1 puff Inhalation Daily  . vitamin B-12  500 mcg Oral Daily  . zolpidem  5 mg Oral QHS   Continuous Infusions: . ceFEPime (MAXIPIME) IV 2 g (12/06/18 0529)  . fluconazole (DIFLUCAN) IV 100 mg (12/05/18 1453)     LOS: 4 days    Time spent: 35    Delaine Lame, MD [facility]  If 7PM-7AM, please contact night-coverage [email or web address] Password [email or web address] 12/06/2018, 10:12 AM

## 2018-12-06 NOTE — Progress Notes (Signed)
Nutrition Follow-up  DOCUMENTATION CODES:   Non-severe (moderate) malnutrition in context of chronic illness  INTERVENTION:   Given malnutrition status and inability for PO intakes and PEG placement at this time, would recommend TPN initiation until counts have improved and tube can be placed for nutrition support.  Once nutrition support initiated, recommend monitor magnesium, potassium, and phosphorus daily for at least 3 days, MD to replete as needed, as pt is at risk for refeeding syndrome given poor PO intakes and malnutrition status.  Recommend daily weights (last recorded 1/11).  NUTRITION DIAGNOSIS:   Moderate Malnutrition related to chronic illness, cancer and cancer related treatments as evidenced by percent weight loss, energy intake < or equal to 75% for > or equal to 1 month, mild muscle depletion.  Ongoing.  GOAL:   Patient will meet greater than or equal to 90% of their needs  Not meeting.  MONITOR:   PO intake, Labs, Weight trends, I & O's  REASON FOR ASSESSMENT:   Consult Assessment of nutrition requirement/status  ASSESSMENT:   53 y.o.malewith medical history significant ofSCLC chemo x8 days ago, HTN, CAD. Patient presents to the ED with c/o multiple episodes of N/V for past couple of days. Associated generalized weakness. Generalized abd pain. Poor PO intake.  Initial assessment completed 1/13. At that time feeding tube placement was recommended and IR was consulted. Per IR note, pt is unable to have G-tube placement given pancytopenia, neutropenia and needs to be off Plavix for 5 days (currently off 3 days now).  Pt is at high risk of developing severe malnutrition without nutrition support, until tube can be placed would recommend TPN initiation.  No new weights recorded since 1/11. Recommend daily weights.  Medications: Vitamin D tablet daily, Marinol capsule BID, Remeron tablet daily, Vitamin B-12 tablet daily, IV KCl, IV Zofran  Labs  reviewed: Low K  Diet Order:   Diet Order            Diet clear liquid Room service appropriate? Yes; Fluid consistency: Thin  Diet effective now              EDUCATION NEEDS:   Education needs have been addressed  Skin:  Skin Assessment: Reviewed RN Assessment  Last BM:  1/9  Height:   Ht Readings from Last 1 Encounters:  12/02/18 5\' 9"  (1.753 m)    Weight:   Wt Readings from Last 1 Encounters:  12/02/18 83.7 kg    Ideal Body Weight:  72.7 kg  BMI:  Body mass index is 27.25 kg/m.  Estimated Nutritional Needs:   Kcal:  2500-2700  Protein:  125-135g  Fluid:  2.5L/day  Clayton Bibles, MS, RD, LDN [place] [facility] Pager: [phone] After Hours Pager: [phone]

## 2018-12-07 ENCOUNTER — Ambulatory Visit: Payer: Medicare Other

## 2018-12-07 LAB — BASIC METABOLIC PANEL
Anion gap: 9 (ref 5–15)
BUN: 11 mg/dL (ref 6–20)
CO2: 24 mmol/L (ref 22–32)
Calcium: 8.5 mg/dL — ABNORMAL LOW (ref 8.9–10.3)
Chloride: 107 mmol/L (ref 98–111)
Creatinine, Ser: 1.49 mg/dL — ABNORMAL HIGH (ref 0.61–1.24)
GFR calc Af Amer: >60 mL/min (ref >60)
GFR calc non Af Amer: 53 mL/min — ABNORMAL LOW (ref >60)
GLUCOSE: 71 mg/dL (ref 70–99)
Potassium: 3.5 mmol/L (ref 3.5–5.1)
Sodium: 140 mmol/L (ref 135–145)

## 2018-12-07 LAB — CBC WITH DIFFERENTIAL/PLATELET
Abs Immature Granulocytes: 0.03 10*3/uL (ref 0.00–0.07)
Basophils Absolute: 0 10*3/uL (ref 0.0–0.1)
Basophils Relative: 1 %
Eosinophils Absolute: 0 10*3/uL (ref 0.0–0.5)
Eosinophils Relative: 2 %
HCT: 29.9 % — ABNORMAL LOW (ref 39.0–52.0)
Hemoglobin: 9.7 g/dL — ABNORMAL LOW (ref 13.0–17.0)
Immature Granulocytes: 4 %
LYMPHS ABS: 0.3 10*3/uL — AB (ref 0.7–4.0)
Lymphocytes Relative: 32 %
MCH: 28.6 pg (ref 26.0–34.0)
MCHC: 32.4 g/dL (ref 30.0–36.0)
MCV: 88.2 fL (ref 80.0–100.0)
Monocytes Absolute: 0.3 10*3/uL (ref 0.1–1.0)
Monocytes Relative: 36 %
Neutro Abs: 0.2 10*3/uL — ABNORMAL LOW (ref 1.7–7.7)
Neutrophils Relative %: 25 %
Platelets: 32 10*3/uL — ABNORMAL LOW (ref 150–400)
RBC: 3.39 MIL/uL — ABNORMAL LOW (ref 4.22–5.81)
RDW: 14.8 % (ref 11.5–15.5)
WBC: 0.9 10*3/uL — CL (ref 4.0–10.5)
nRBC: 0 % (ref 0.0–0.2)

## 2018-12-07 LAB — MAGNESIUM: Magnesium: 1.2 mg/dL — ABNORMAL LOW (ref 1.7–2.4)

## 2018-12-07 LAB — CULTURE, BLOOD (ROUTINE X 2)
Culture: NO GROWTH
Culture: NO GROWTH
Special Requests: ADEQUATE
Special Requests: ADEQUATE

## 2018-12-07 LAB — PHOSPHORUS: Phosphorus: 1.8 mg/dL — ABNORMAL LOW (ref 2.5–4.6)

## 2018-12-07 MED ORDER — INSULIN ASPART 100 UNIT/ML ~~LOC~~ SOLN: 0.0000 [IU] | SUBCUTANEOUS | Status: DC

## 2018-12-07 MED ORDER — POTASSIUM PHOSPHATES 15 MMOLE/5ML IV SOLN
30.0000 mmol | Freq: Once | INTRAVENOUS | Status: AC
  Administered 2018-12-07: 30 mmol via INTRAVENOUS
  Filled 2018-12-07: qty 10

## 2018-12-07 MED ORDER — SODIUM CHLORIDE 0.9% FLUSH: 10.0000 mL | INTRAVENOUS | Status: DC | PRN

## 2018-12-07 MED ORDER — POTASSIUM CHLORIDE 10 MEQ/50ML IV SOLN
10.0000 meq | INTRAVENOUS | Status: DC
  Filled 2018-12-07 (×4): qty 50

## 2018-12-07 MED ORDER — SODIUM CHLORIDE 0.9 % IV SOLN
INTRAVENOUS | Status: DC | PRN
  Administered 2018-12-07: 250 mL via INTRAVENOUS
  Administered 2018-12-11: 500 mL via INTRAVENOUS

## 2018-12-07 MED ORDER — TRAVASOL 10 % IV SOLN
INTRAVENOUS | Status: AC
  Administered 2018-12-07: 18:00:00 via INTRAVENOUS
  Filled 2018-12-07: qty 360

## 2018-12-07 MED ORDER — INSULIN ASPART 100 UNIT/ML ~~LOC~~ SOLN
0.0000 [IU] | Freq: Four times a day (QID) | SUBCUTANEOUS | Status: DC
  Administered 2018-12-09 – 2018-12-13 (×3): 1 [IU] via SUBCUTANEOUS

## 2018-12-07 MED ORDER — SODIUM CHLORIDE 0.9% FLUSH
10.0000 mL | Freq: Two times a day (BID) | INTRAVENOUS | Status: DC
  Administered 2018-12-08 – 2018-12-12 (×4): 10 mL

## 2018-12-07 MED ORDER — MAGNESIUM SULFATE 4 GM/100ML IV SOLN
4.0000 g | Freq: Once | INTRAVENOUS | Status: AC
  Administered 2018-12-07: 4 g via INTRAVENOUS
  Filled 2018-12-07: qty 100

## 2018-12-07 NOTE — Progress Notes (Signed)
PHARMACY - ADULT TOTAL PARENTERAL NUTRITION CONSULT NOTE   Pharmacy Consult for TPN Indication: intolerance of enteral feeding, unable to place G-tube  Patient Measurements: Height: 5\' 9"  (175.3 cm) Weight: 184 lb 8.4 oz (83.7 kg) IBW/kg (Calculated) : 70.7 TPN AdjBW (KG): 83.7 Body mass index is 27.25 kg/m. Usual Weight: 105-115 kg (20-30 kg wt loss since Oct 2019)  HPI: 9 yoM with PMH SCLC with recent chemo and pancytopenia, HTN, CAD, admitted with n/v and weakness. Found to have recurrent esophageal stenosis d/t malignancy vs radiation. Holding Plavix for possible G-tube placement, but still with low WBC and Plt following chemo so unable to place in the near future. Pharmacy to dose TPN as patient has been without adequate nutrition for several weeks  Central access: Chemo port, single lumen TPN start date: 1/16  Significant events:   ASSESSMENT                                                                                                           Insulin Requirements: none ordered  Current Nutrition: CLD, not tolerating much  IVF: none  Today, 12/07/2018:  Glucose (CBG goal 100-150) - low/normal off TPN  Electrolytes - Mg, Phos low, K borderline low, otherwise WNL  Renal - SCr elevated on admit, had improved to slightly above baseline (baseline ~1.1) but higher today  LFTs - WNL on 1/11 except low albumin  TGs - pending  Prealbumin - pending  NUTRITIONAL GOALS                                                                                             RD recs:  Kcal:  2500-2700 Protein:  125-135g Fluid:  2.5L/day  PLAN                                                                                                                          Mg 4g IV x 1  Kphos 30 mmol IV x 1 (provides ~45 mEq potassium)  At 1800 today:  Initiate TPN at 30 ml/hr; plan to advance as tolerated to the goal rate  TPN at goal rate of 100 ml/hr provides 120 g  of protein, 360 g of  dextrose, and 72 g of lipids for a total of 2424 kcals meeting 97% of patient needs  Add MVI, trace elements to TPN  Electrolytes in TPN: standard concentration; Cl:Ac 1:1  Begin sensitive SSI with q6 hr CBG checks  TPN lab panels on Mondays & Thursdays; full panel tomorrow AM  Reuel Boom, PharmD, BCPS [phone] 12/07/2018, 10:33 AM

## 2018-12-07 NOTE — Progress Notes (Signed)
Nutrition Note  RD consulted regarding patient starting TPN.  Estimated needs provided in last full note 1/15.  Will continue to monitor plan and ability to have G-tube placement.   Clayton Bibles, MS, RD, [place] Dietitian Pager: [phone] After Hours Pager: [phone]

## 2018-12-07 NOTE — Progress Notes (Signed)
Patient's platelets and WBC count still too low to consider EGD.  Will re-evaluate after labs tomorrow morning.  Continue current treatment regimen for now.  Will continue to follow peripherally until counts adequate and safe for procedure.

## 2018-12-07 NOTE — Progress Notes (Signed)
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 intake and found to have influenza B and possible right lower lobe pneumonia.   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  GI seen him and is considering an EGD once he is off clopidogrel for 5 days and is no longer pancytopenic.  Additionally he is pending placement of a G-tube. -Clear liquid diet by mouth as tolerated -Start TPN today, pharmacy consult - Off clopidogrel since 12/03/2018, 5 days will be 12/08/2018  #) Influenza B/right lower lobe pneumonia: Unclear how much contributing to his current presentation is influenza B and this possible pneumonia.  The  pneumonia is almost certainly postobstructive. -Continue IV cefepime started 12/02/2018 -Continue oseltamivir started 12/03/2018 -Blood cultures from 12/02/2018 no growth to date  #) Pancytopenia: Secondary to chemotherapy -Hold clopidogrel and subcutaneous prophylaxis while platelets are below 50  #) Hypertension/hyperlipidemia: Currently patient has difficulty taking pills by mouth -Hold amlodipine 10 mg daily - Hold isosorbide mononitrate 120 mg daily -Hold nebivolol 20 mg daily - Continue rosuvastatin 20 mg daily - Hold hydralazine 25 mg twice daily -Continue IV metoprolol 2.5 mg every 8 hours  #) Coronary artery disease status post stent: Patient apparently is no longer a candidate for any nonsurgical interventions. -Continue aspirin 81 mg daily -Hold clopidogrel 75 mg daily -Continue beta-blocker, long-acting nitrate - Continue statin -Continue ranolazine thousand milligrams twice daily  #) COPD: -Continue L AMA/ICS/LABA -Continue PRN short-acting bronchodilators  #) Small cell lung cancer: Chemotherapy was on 11/23/2018 - consult oncology, Dr. Shirline Frees  #) BPH: -Continue prazosin  #) Pain/psych: -Continue dronabinol -Continue fluoxetine 40 mg daily -Continue prazosin 1 mg nightly  Fluids: Tolerating p.o., TPN Electrolytes: Monitor and supplement Nutrition: Per above  Prophylaxis: Thrombocytopenic   Disposition: Pending recovery of counts, EGD and PEG tube placement  Full code   Consultants:   Interventional radiology  Oncology, Dr. Shirline Frees  Procedures:   None  Antimicrobials:   IV cefepime started 12/02/2018  P.o. oseltamivir started 12/03/2018   Subjective: This morning the patient reports that he is doing fairly well.  He continues to report a sore throat but denies any nausea, vomiting, diarrhea, cough, congestion, rhinorrhea.  Is not had any chest pain.  Objective: Vitals:   12/06/18 2032 12/06/18 2111 12/07/18 0235  12/07/18 0603    BP: (!) 133/93  (!) 128/91 (!) 149/94  Pulse: 84  87 (!) 101  Resp: 16  16 20   Temp: 98.7 F (37.1 C)   99 F (37.2 C)  TempSrc: Oral   Oral  SpO2: 95% 92% 94% 95%  Weight:      Height:        Intake/Output Summary (Last 24 hours) at 12/07/2018 0948 Last data filed at 12/06/2018 2032 Gross per 24 hour  Intake 0 ml  Output 900 ml  Net -900 ml   Filed Weights   12/01/18 1836 12/02/18 0012  Weight: 90 kg 83.7 kg    Examination:  General exam: Appears calm and comfortable  Respiratory system: Clear to auscultation. Respiratory effort normal.  Intermittent wheezes at bilateral lung bases worse on right Cardiovascular system: Regular rate and rhythm, no murmurs Gastrointestinal system: Soft, nondistended, no rebound or guarding, plus bowel sounds Central nervous system: Alert and oriented.  Grossly intact, moving all extremities Extremities: No lower extremity edema. Skin: Port site is clean dry and intact Psychiatry: Judgement and insight appear normal. Mood & affect appropriate.     Data Reviewed: I have personally reviewed following labs and imaging studies  CBC: Recent Labs  Lab 12/01/18 1918 12/02/18 0615 12/03/18 0800 12/04/18 0655 12/05/18 0553 12/06/18 0521 12/07/18 0622  WBC 0.9* 0.8*  --  0.5* 0.4* 0.4* 0.9*  NEUTROABS 0.7*  --   --   --   --  0.1* 0.2*  HGB 9.1* 7.5* 7.6* 6.8* 8.9* 9.3* 9.7*  HCT 28.4* 24.0* 24.3* 21.3* 27.3* 28.3* 29.9*  MCV 86.9 89.6  --  88.4 87.8 85.5 88.2  PLT 43* 31*  --  12* 40* 30* 32*   Basic Metabolic Panel: Recent Labs  Lab 12/01/18 1918 12/02/18 0615 12/04/18 0655 12/05/18 0553 12/06/18 0521 12/07/18 0622  NA 142 142  --  145 142 140  K 3.5 3.9  --  3.0* 3.1* 3.5  CL 105 108  --  110 106 107  CO2 25 25  --  24 24 24   GLUCOSE 88 73  --  66* 95 71  BUN 32* 28*  --  14 12 11   CREATININE 1.70* 1.57* 1.39* 1.36* 1.33* 1.49*  CALCIUM 8.7* 8.3*  --  8.3* 8.5* 8.5*  MG 1.4*  --   --   --   --   --     GFR: Estimated Creatinine Clearance: 58 mL/min (A) (by C-G formula based on SCr of 1.49 mg/dL (H)). Liver Function Tests: Recent Labs  Lab 12/01/18 1918 12/02/18 0615  AST 20 16  ALT 21 16  ALKPHOS 64 48  BILITOT 0.7 0.8  PROT 6.8 5.6*  ALBUMIN 3.1* 2.7*   Recent Labs  Lab 12/01/18 1918  LIPASE 31   No results for input(s): AMMONIA in the last 168 hours. Coagulation Profile: Recent Labs  Lab 12/05/18 1242  INR 1.08   Cardiac Enzymes: Recent Labs  Lab 12/01/18 1918  TROPONINI <0.03   BNP (last 3 results) No results for input(s): PROBNP in the last 8760 hours. HbA1C: No results for input(s): HGBA1C in the last 72 hours. CBG: No results for input(s): GLUCAP in the last 168 hours. Lipid Profile: No results for input(s): CHOL, HDL, LDLCALC, TRIG, CHOLHDL, LDLDIRECT in the last 72 hours. Thyroid Function Tests: No results for input(s): TSH, T4TOTAL, FREET4, T3FREE, THYROIDAB in the last 72 hours. Anemia Panel: No results for input(s): VITAMINB12, FOLATE, FERRITIN, TIBC, IRON,  RETICCTPCT in the last 72 hours. Sepsis Labs: Recent Labs  Lab 12/02/18 1610  PROCALCITON <0.10    Recent Results (from the past 240 hour(s))  Culture, blood (routine x 2)     Status: None   Collection Time: 12/01/18  9:33 PM  Result Value Ref Range Status   Specimen Description   Final    BLOOD PORTA CATH Performed at [facility], [address] [place], [place] [address]    Special Requests   Final    BOTTLES DRAWN AEROBIC AND ANAEROBIC Blood Culture adequate volume Performed at [facility], [address], [place], [place] [address]    Culture   Final    NO GROWTH 5 DAYS Performed at [facility], [address] [place], [place] [address]    Report Status 12/07/2018 FINAL  Final  Culture, blood (routine x 2)     Status: None   Collection Time: 12/02/18 12:00 AM  Result Value Ref Range Status   Specimen Description   Final     BLOOD RIGHT HAND Performed at [facility], [address], [place], [place] [address]    Special Requests   Final    BOTTLES DRAWN AEROBIC AND ANAEROBIC Blood Culture adequate volume Performed at [facility], [address], [place], [place] [address]    Culture   Final    NO GROWTH 5 DAYS Performed at [facility], [address] [place], [place] [address]    Report Status 12/07/2018 FINAL  Final  Urine culture     Status: None   Collection Time: 12/02/18 12:18 AM  Result Value Ref Range Status   Specimen Description URINE, CLEAN CATCH  Final   Special Requests   Final    NONE Performed at [facility], [address], [place], [place] [address]    Culture NO GROWTH  Final   Report Status 12/03/2018 FINAL  Final         Radiology Studies: No results found.      Scheduled Meds: . cholecalciferol  2,000 Units Oral Daily  . dronabinol  5 mg Oral BID AC  . FLUoxetine  40 mg Oral Daily  . metoprolol tartrate  2.5 mg Intravenous Q8H  . mirtazapine  30 mg Oral QHS  . mometasone-formoterol  2 puff Inhalation BID  . oseltamivir  75 mg Oral BID  . pantoprazole (PROTONIX) IV  40 mg Intravenous Q24H  . prazosin  1 mg Oral QHS  . PRUTECT   Topical BID  . ranolazine  500 mg Oral BID  . rosuvastatin  20 mg Oral Daily  . sucralfate  1 g Oral TID WC & HS  . Tbo-filgastrim (GRANIX) SQ  480 mcg Subcutaneous q1800  . umeclidinium bromide  1 puff Inhalation Daily  . vitamin B-12  500 mcg Oral Daily  . zolpidem  5 mg Oral QHS   Continuous Infusions: . sodium chloride 250 mL (12/07/18 0619)  . ceFEPime (MAXIPIME) IV 2 g (12/07/18 5284)  . fluconazole (DIFLUCAN) IV 100 mg (12/06/18 1732)     LOS: 5 days    Time spent: 35    Delaine Lame, MD [facility]  If 7PM-7AM, please contact night-coverage [email or web address] Password [facility] 12/07/2018, 9:48 AM

## 2018-12-08 ENCOUNTER — Encounter (HOSPITAL_COMMUNITY): Payer: Self-pay | Admitting: *Deleted

## 2018-12-08 ENCOUNTER — Inpatient Hospital Stay (HOSPITAL_COMMUNITY): Payer: Medicare Other | Admitting: Certified Registered Nurse Anesthetist

## 2018-12-08 ENCOUNTER — Encounter (HOSPITAL_COMMUNITY)
Admission: EM | Disposition: A | Discharge status: Home / Self Care | Payer: Self-pay | Source: Home / Self Care | Attending: Internal Medicine

## 2018-12-08 DIAGNOSIS — K208 Other esophagitis: Secondary | ICD-10-CM

## 2018-12-08 DIAGNOSIS — T66XXXA Radiation sickness, unspecified, initial encounter: Secondary | ICD-10-CM

## 2018-12-08 HISTORY — PX: ESOPHAGOGASTRODUODENOSCOPY (EGD) WITH PROPOFOL: SHX5813

## 2018-12-08 LAB — GLUCOSE, CAPILLARY
Glucose-Capillary: 106 mg/dL — ABNORMAL HIGH (ref 70–99)
Glucose-Capillary: 135 mg/dL — ABNORMAL HIGH (ref 70–99)
Glucose-Capillary: 89 mg/dL (ref 70–99)
Glucose-Capillary: 90 mg/dL (ref 70–99)
Glucose-Capillary: 95 mg/dL (ref 70–99)

## 2018-12-08 LAB — COMPREHENSIVE METABOLIC PANEL
ALT: 11 U/L (ref 0–44)
AST: 13 U/L — ABNORMAL LOW (ref 15–41)
Albumin: 2.7 g/dL — ABNORMAL LOW (ref 3.5–5.0)
Alkaline Phosphatase: 54 U/L (ref 38–126)
Anion gap: 9 (ref 5–15)
BUN: 14 mg/dL (ref 6–20)
CO2: 25 mmol/L (ref 22–32)
Calcium: 8.6 mg/dL — ABNORMAL LOW (ref 8.9–10.3)
Chloride: 107 mmol/L (ref 98–111)
Creatinine, Ser: 1.58 mg/dL — ABNORMAL HIGH (ref 0.61–1.24)
GFR calc Af Amer: 57 mL/min — ABNORMAL LOW (ref >60)
GFR calc non Af Amer: 50 mL/min — ABNORMAL LOW (ref >60)
Glucose, Bld: 94 mg/dL (ref 70–99)
Potassium: 3.4 mmol/L — ABNORMAL LOW (ref 3.5–5.1)
Sodium: 141 mmol/L (ref 135–145)
Total Protein: 5.9 g/dL — ABNORMAL LOW (ref 6.5–8.1)

## 2018-12-08 LAB — TYPE AND SCREEN
ABO/RH(D): A POS
Antibody Screen: NEGATIVE

## 2018-12-08 LAB — CBC WITH DIFFERENTIAL/PLATELET
Abs Immature Granulocytes: 0.23 10*3/uL — ABNORMAL HIGH (ref 0.00–0.07)
Basophils Absolute: 0 10*3/uL (ref 0.0–0.1)
Basophils Relative: 2 %
Eosinophils Absolute: 0 10*3/uL (ref 0.0–0.5)
Eosinophils Relative: 1 %
HCT: 28 % — ABNORMAL LOW (ref 39.0–52.0)
Hemoglobin: 9.2 g/dL — ABNORMAL LOW (ref 13.0–17.0)
Immature Granulocytes: 9 %
Lymphocytes Relative: 10 %
Lymphs Abs: 0.3 K/uL — ABNORMAL LOW (ref 0.7–4.0)
MCH: 28.5 pg (ref 26.0–34.0)
MCHC: 32.9 g/dL (ref 30.0–36.0)
MCV: 86.7 fL (ref 80.0–100.0)
Monocytes Absolute: 0.8 K/uL (ref 0.1–1.0)
Monocytes Relative: 31 %
Neutro Abs: 1.2 10*3/uL — ABNORMAL LOW (ref 1.7–7.7)
Neutrophils Relative %: 47 %
Platelets: 33 10*3/uL — ABNORMAL LOW (ref 150–400)
RBC: 3.23 MIL/uL — ABNORMAL LOW (ref 4.22–5.81)
RDW: 14.9 % (ref 11.5–15.5)
WBC: 2.5 10*3/uL — ABNORMAL LOW (ref 4.0–10.5)
nRBC: 0 % (ref 0.0–0.2)

## 2018-12-08 LAB — PREALBUMIN: Prealbumin: 11.5 mg/dL — ABNORMAL LOW (ref 18–38)

## 2018-12-08 LAB — PHOSPHORUS: Phosphorus: 2.4 mg/dL — ABNORMAL LOW (ref 2.5–4.6)

## 2018-12-08 LAB — MAGNESIUM: Magnesium: 1.9 mg/dL (ref 1.7–2.4)

## 2018-12-08 LAB — TRIGLYCERIDES: Triglycerides: 226 mg/dL — ABNORMAL HIGH (ref <150)

## 2018-12-08 LAB — COMPREHENSIVE METABOLIC PANEL WITH GFR: Total Bilirubin: 0.6 mg/dL (ref 0.3–1.2)

## 2018-12-08 SURGERY — ESOPHAGOGASTRODUODENOSCOPY (EGD) WITH PROPOFOL
Anesthesia: Monitor Anesthesia Care

## 2018-12-08 MED ORDER — MAGIC MOUTHWASH W/LIDOCAINE
10.0000 mL | Freq: Three times a day (TID) | ORAL | Status: DC | PRN
  Filled 2018-12-08 (×2): qty 10

## 2018-12-08 MED ORDER — LIDOCAINE 2% (20 MG/ML) 5 ML SYRINGE
INTRAMUSCULAR | Status: DC | PRN
  Administered 2018-12-08: 100 mg via INTRAVENOUS

## 2018-12-08 MED ORDER — SODIUM CHLORIDE 0.9% IV SOLUTION
Freq: Once | INTRAVENOUS | Status: AC
  Administered 2018-12-08: 10 mL via INTRAVENOUS

## 2018-12-08 MED ORDER — LACTATED RINGERS IV SOLN
INTRAVENOUS | Status: DC | PRN
  Administered 2018-12-08: 14:00:00 via INTRAVENOUS

## 2018-12-08 MED ORDER — PROPOFOL 10 MG/ML IV BOLUS
INTRAVENOUS | Status: DC | PRN
  Administered 2018-12-08: 20 mg via INTRAVENOUS

## 2018-12-08 MED ORDER — PROPOFOL 500 MG/50ML IV EMUL
INTRAVENOUS | Status: DC | PRN
  Administered 2018-12-08: 130 ug/kg/min via INTRAVENOUS

## 2018-12-08 MED ORDER — POTASSIUM CHLORIDE 10 MEQ/100ML IV SOLN
10.0000 meq | INTRAVENOUS | Status: AC
  Administered 2018-12-08 (×2): 10 meq via INTRAVENOUS
  Filled 2018-12-08: qty 100

## 2018-12-08 MED ORDER — PROPOFOL 10 MG/ML IV BOLUS
INTRAVENOUS | Status: AC
  Filled 2018-12-08: qty 40

## 2018-12-08 MED ORDER — POTASSIUM CHLORIDE 10 MEQ/100ML IV SOLN
10.0000 meq | INTRAVENOUS | Status: AC
  Administered 2018-12-08 – 2018-12-09 (×2): 10 meq via INTRAVENOUS
  Filled 2018-12-08 (×2): qty 100

## 2018-12-08 MED ORDER — TRAVASOL 10 % IV SOLN
INTRAVENOUS | Status: AC
  Administered 2018-12-08: 18:00:00 via INTRAVENOUS
  Filled 2018-12-08: qty 720

## 2018-12-08 SURGICAL SUPPLY — 15 items

## 2018-12-08 NOTE — Anesthesia Preprocedure Evaluation (Signed)
Anesthesia Evaluation  Patient identified by MRN, date of birth, ID band Patient awake    Reviewed: Allergy & Precautions, NPO status , Patient's Chart, lab work & pertinent test results, reviewed documented beta blocker date and time   Airway Mallampati: II  TM Distance: >3 FB Neck ROM: Full    Dental  (+) Edentulous Upper, Edentulous Lower   Pulmonary asthma , sleep apnea , COPD, former smoker,  Stage 3 lung cancer   Pulmonary exam normal breath sounds clear to auscultation       Cardiovascular hypertension, Pt. on medications and Pt. on home beta blockers + angina with exertion + CAD, + Past MI, + Cardiac Stents and + DOE  Normal cardiovascular exam Rhythm:Regular Rate:Normal  Echo 02/26/18: Study Conclusions  - Left ventricle: The cavity size was normal. Wall thickness was normal. Systolic function was normal. The estimated ejection fraction was in the range of 55% to 60%. Possible hypokinesis of the inferolateral myocardium. Doppler parameters are consistent with abnormal left ventricular relaxation (grade 1 diastolic   dysfunction). - Mitral valve: Calcified annulus. - Left atrium: The atrium was mildly dilated.   last stress test was on 07/07/2016 which showed EF 54%, normal resting in the stress perfusion image   Neuro/Psych PSYCHIATRIC DISORDERS Anxiety Depression  Neuromuscular disease    GI/Hepatic Neg liver ROS, GERD  Medicated,  Endo/Other  diabetesObesity   Renal/GU negative Renal ROS     Musculoskeletal negative musculoskeletal ROS (+)   Abdominal   Peds  Hematology  (+) Blood dyscrasia (Plavix), ,   Anesthesia Other Findings Day of surgery medications reviewed with the patient.  Reproductive/Obstetrics                             Anesthesia Physical  Anesthesia Plan  ASA: IV  Anesthesia Plan: MAC   Post-op Pain Management:    Induction: Intravenous  PONV Risk  Score and Plan: 2 and Ondansetron and Midazolam  Airway Management Planned: Nasal Cannula  Additional Equipment:   Intra-op Plan:   Post-operative Plan:   Informed Consent: I have reviewed the patients History and Physical, chart, labs and discussed the procedure including the risks, benefits and alternatives for the proposed anesthesia with the patient or authorized representative who has indicated his/her understanding and acceptance.     Dental advisory given  Plan Discussed with: CRNA  Anesthesia Plan Comments:         Anesthesia Quick Evaluation

## 2018-12-08 NOTE — Interval H&P Note (Signed)
History and Physical Interval Note:  12/08/2018 1:47 PM  Andrew West  has presented today for surgery, with the diagnosis of Dysphagia, odynophagia  The various methods of treatment have been discussed with the patient and family. After consideration of risks, benefits and other options for treatment, the patient has consented to  Procedure(s): ESOPHAGOGASTRODUODENOSCOPY (EGD) WITH PROPOFOL (N/A) as a surgical intervention .  The patient's history has been reviewed, patient examined, no change in status, stable for surgery.  I have reviewed the patient's chart and labs.  Questions were answered to the patient's satisfaction.     Milus Banister

## 2018-12-08 NOTE — Progress Notes (Signed)
PHARMACY - ADULT TOTAL PARENTERAL NUTRITION CONSULT NOTE   Pharmacy Consult for TPN Indication: intolerance of enteral feeding, unable to place G-tube  Patient Measurements: Height: 5\' 9"  (175.3 cm) Weight: 184 lb 8.4 oz (83.7 kg) IBW/kg (Calculated) : 70.7 TPN AdjBW (KG): 83.7 Body mass index is 27.25 kg/m. Usual Weight: 105-115 kg (20-30 kg wt loss since Oct 2019)  HPI: 56 yoM with PMH SCLC with recent chemo and pancytopenia, HTN, CAD, admitted with n/v and weakness. Found to have recurrent esophageal stenosis d/t malignancy vs radiation. Holding Plavix for possible G-tube placement, but still with low WBC and Plt following chemo so unable to place in the near future. Pharmacy to dose TPN as patient has been without adequate nutrition for several weeks  Central access: Chemo port, single lumen TPN start date: 1/16  Significant events:  1/17: giving Plt to allow for EGD to proceed today, but expect several more days before consideration of G-tube placement  ASSESSMENT                                                                                                           Current Nutrition: NPO for procedure  IVF: none  Today, 12/08/2018:  Glucose (CBG goal 100-150) - remains low/normal on starting-rate TPN; no SSI needed  Electrolytes - Mg improved after repletion, K lower despite repletion; Phos improved to near-normal; others stable WNL  Renal - AKI on admission; had almost resolved but now SCr trending back up. Not receiving IV hydration outside TPN, but BUN/SCr not reflective of dehydration at this point. UOP dwindling  LFTs - WNL on 1/17 except low albumin  TGs - elevated but < 400 currently (1/17)  Prealbumin - moderately low (1/17)  NUTRITIONAL GOALS                                                                                             RD recs:  Kcal:  2500-2700 Protein:  125-135g Fluid:  2.5L/day  PLAN  KCl 10 mEq IV x 4  At 1800 today:  Advance TPN to 60 ml/hr; if G-tube placement appears imminent, would hold off on advancing to goal rate of 100 ml/hr. If not likely to be done within next few days, would advance to goal as tolerated  TPN at goal rate of 100 ml/hr provides 120 g of protein, 360 g of dextrose, and 72 g of lipids for a total of 2424 kcals meeting 97% of patient needs  Add MVI, trace elements to TPN  Electrolytes in TPN: Increase K and Phos, others standard concentration; Cl:Ac 1:1  Continue sensitive SSI with q6 hr CBG checks, wean as able if not utilizing  TPN lab panels on Mondays & Thursdays; BMP, Mg, Phos tomorrow  Reuel Boom, PharmD, BCPS [phone] 12/08/2018, 10:25 AM

## 2018-12-08 NOTE — Anesthesia Postprocedure Evaluation (Signed)
Anesthesia Post Note  Patient: Andrew West  Procedure(s) Performed: ESOPHAGOGASTRODUODENOSCOPY (EGD) WITH PROPOFOL (N/A )     Patient location during evaluation: Endoscopy Anesthesia Type: MAC Level of consciousness: awake and alert Pain management: pain level controlled Vital Signs Assessment: post-procedure vital signs reviewed and stable Respiratory status: spontaneous breathing, nonlabored ventilation, respiratory function stable and patient connected to nasal cannula oxygen Cardiovascular status: stable and blood pressure returned to baseline Postop Assessment: no apparent nausea or vomiting Anesthetic complications: no    Last Vitals:  Vitals:   12/08/18 1440 12/08/18 1500  BP: (!) 144/97 (!) 137/97  Pulse: (!) 112 93  Resp: 20 18  Temp:  37.1 C  SpO2: 95% 97%    Last Pain:  Vitals:   12/08/18 1500  TempSrc: Oral  PainSc:                  [name]

## 2018-12-08 NOTE — Op Note (Addendum)
[facility] Patient Name: Andrew West Procedure Date: 12/08/2018 MRN: [id number] Attending MD: Rachael Fee , MD Date of Birth: 09/26/1966 CSN: [id number] Age: 53 Admit Type: Inpatient Procedure:                Upper GI endoscopy Indications:              Dysphagia, Odynophagia; he has early stage small                            cell lung cancer diagnosed 08/2018; has been on                            chemo (last dose 11/23/2018) and XRT (last 10/30/2018) Providers:                Rachael Fee, MD, Roselie Awkward, RN, Harrington Challenger, Technician Referring MD:              Medicines:                Monitored Anesthesia Care Complications:            No immediate complications. Estimated blood loss:                            None. Estimated Blood Loss:     Estimated blood loss: none. Procedure:                Pre-Anesthesia Assessment:                           - Prior to the procedure, a History and Physical                            was performed, and patient medications and                            allergies were reviewed. The patient's tolerance of                            previous anesthesia was also reviewed. The risks                            and benefits of the procedure and the sedation                            options and risks were discussed with the patient.                            All questions were answered, and informed consent                            was obtained. Prior Anticoagulants: The patient has  taken Plavix (clopidogrel), last dose was 6 days                            prior to procedure. ASA Grade Assessment: IV - A                            patient with severe systemic disease that is a                            constant threat to life. After reviewing the risks                            and benefits, the patient was deemed in                            satisfactory  condition to undergo the procedure.                           After obtaining informed consent, the endoscope was                            passed under direct vision. Throughout the                            procedure, the patient's blood pressure, pulse, and                            oxygen saturations were monitored continuously. The                            [id number]-H190 ([id number] Olympus gastroscope was                            introduced through the mouth, and advanced to the                            middle third of esophagus. The upper GI endoscopy                            was accomplished without difficulty. The patient                            tolerated the procedure well. Scope In: Scope Out: Findings:      Severely inflammed proximal esophagus; there is very friable, oozing       mucosa with evident sloughing of mucosa. The lumen narrows to 6-16mm       across in the proximal to mid esophagus. I may have been able to pass       the scope further with gentle pressure but the extreme friability and       mucosal sloughing were concerning to me so I did not. Impression:               - Severely inflammed proximal esophagus; there is  very friable, oozing mucosa with evident sloughing                            of mucosa. The lumen narrows to 6-64mm across in the                            proximal to mid esophagus. I may have been able to                            pass the scope further with gentle pressure but the                            extreme friability and mucosal sloughing were                            concerning to me so I did not.                           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 It will take time for the damage to                            heal. It may be weeks before he is able to provide                            adequate nurtional orally and so I still recommend                             that G tube be placed. Moderate Sedation:      Not Applicable - Patient had care per Anesthesia. Recommendation:           - Return patient to hospital ward for ongoing care.                            Likely this is acute radiation related esophagitis.                            It will take time for the damage to heal. It may be                            weeks before he is able to provide adequate                            nurtional orally and so I still recommend that a G                            tube be placed. He is able to manage his secretions  fortunately. Procedure Code(s):        --- Professional ---                           43200, Esophagoscopy, flexible, transoral;                            diagnostic, including collection of specimen(s) by                            brushing or washing, when performed (separate                            procedure) Diagnosis Code(s):        --- Professional ---                           K21.0, Gastro-esophageal reflux disease with                            esophagitis                           R13.10, Dysphagia, unspecified CPT copyright 2018 American Medical Association. All rights reserved. The codes documented in this report are preliminary and upon coder review may  be revised to meet current compliance requirements. Rachael Fee, MD 12/08/2018 2:28:37 PM This report has been signed electronically. Number of Addenda: 0

## 2018-12-08 NOTE — Transfer of Care (Signed)
Immediate Anesthesia Transfer of Care Note  Patient: Andrew West  Procedure(s) Performed: ESOPHAGOGASTRODUODENOSCOPY (EGD) WITH PROPOFOL (N/A )  Patient Location: PACU  Anesthesia Type:MAC  Level of Consciousness: awake, alert  and oriented  Airway & Oxygen Therapy: Patient Spontanous Breathing and Patient connected to face mask oxygen  Post-op Assessment: Report given to RN and Post -op Vital signs reviewed and stable  Post vital signs: Reviewed and stable  Last Vitals:  Vitals Value Taken Time  BP    Temp    Pulse 108 12/08/2018  2:29 PM  Resp 18 12/08/2018  2:29 PM  SpO2 100 % 12/08/2018  2:29 PM  Vitals shown include unvalidated device data.  Last Pain:  Vitals:   12/08/18 1332  TempSrc: Oral  PainSc: 8       Patients Stated Pain Goal: 0 (11/09/74 8832)  Complications: No apparent anesthesia complications

## 2018-12-08 NOTE — Progress Notes (Signed)
Have been waiting for WBC count and platelets to improve as well as Plavix wash out in order to safely proceed with EGD to evaluated dysphagia and odynophagia.  WBC count better and within safe range today.  Plavix has been held for 5 days.  Will plan for EGD early this afternoon.  Platelets still low at 33 but hospitalist will give one unit of platelets this AM prior to procedure.  Discussed with patient and he is willing to proceed.

## 2018-12-08 NOTE — Progress Notes (Signed)
Patient ID: Andrew West, male   DOB: April 26, 1966, 53 y.o.   MRN: [id number] Will tent plan G tube placement on pt early next week; case has been reviewed by Dr. Anselm Pancoast and d/w Dr. Ardis Hughs. Direct puncture placement planned over pull thru due to sig radiation esophagitis. Will eval over weekend

## 2018-12-08 NOTE — Care Management Important Message (Signed)
Important Message  Patient Details  Name: Andrew West MRN: [id number] Date of Birth: 09/19/1966   Medicare Important Message Given:  Yes. CMA gave patient's IM to his nurse.     Tiernan Suto 12/08/2018, 9:00 AM

## 2018-12-08 NOTE — Progress Notes (Signed)
 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 intake and found to have influenza B and possible right lower lobe pneumonia.   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  GI seen him and is considering an EGD once he is off clopidogrel for 5 days and is no longer pancytopenic.  Additionally he is pending placement of a G-tube. -Clear liquid diet by mouth as tolerated -Continue TPN today, pharmacy consult - Off clopidogrel since 12/03/2018, for total of 5 days -Plan for EGD today after 2 units platelets  #) Influenza B/right lower lobe pneumonia: Unclear how much contributing to his current presentation is  influenza B and this possible pneumonia.  The pneumonia is almost certainly postobstructive. -Continue IV cefepime started 12/02/2018 -Continue oseltamivir started 12/03/2018 -Blood cultures from 12/02/2018 no growth to date  #) Pancytopenia: Secondary to chemotherapy -Hold clopidogrel and subcutaneous prophylaxis while platelets are below 50 -2 units of platelets today  #) Hypertension/hyperlipidemia: Currently patient has difficulty taking pills by mouth -Hold amlodipine 10 mg daily - Hold isosorbide mononitrate 120 mg daily -Hold nebivolol 20 mg daily - Continue rosuvastatin 20 mg daily - Hold hydralazine 25 mg twice daily -Continue IV metoprolol 2.5 mg every 8 hours  #) Coronary artery disease status post stent: Patient apparently is no longer a candidate for any nonsurgical interventions. -Continue aspirin 81 mg daily -Hold clopidogrel 75 mg daily -Continue beta-blocker, long-acting nitrate - Continue statin -Continue ranolazine thousand milligrams twice daily  #) COPD: -Continue L AMA/ICS/LABA -Continue PRN short-acting bronchodilators  #) Small cell lung cancer: Chemotherapy was on 11/23/2018 - consult oncology, Dr. Shirline Frees  #) BPH: -Continue prazosin  #) Pain/psych: -Continue dronabinol -Continue fluoxetine 40 mg daily -Continue prazosin 1 mg nightly  Fluids: Tolerating p.o., TPN Electrolytes: Monitor and supplement Nutrition: Per above  Prophylaxis: Thrombocytopenic   Disposition: Pending recovery of counts, EGD and PEG tube placement  Full code   Consultants:   Interventional radiology  Oncology, Dr. Shirline Frees  Procedures:   None  Antimicrobials:   IV cefepime started 12/02/2018  P.o. oseltamivir started 12/03/2018   Subjective: This morning the patient reports that he is doing fairly well.  He is sleeping in bed.  He is excited to get his EGD.  He continues to report some throat pain  but denies any nausea, vomiting, diarrhea, abdominal  pain.  Objective: Vitals:   12/07/18 1326 12/07/18 1943 12/07/18 2142 12/08/18 0639  BP: (!) 137/97  128/87 (!) 134/97  Pulse: 98  91 85  Resp: 18  16 18   Temp: 98.7 F (37.1 C)  98.1 F (36.7 C) 98.1 F (36.7 C)  TempSrc: Oral  Oral Oral  SpO2: 95% 94% 95% 96%  Weight:      Height:        Intake/Output Summary (Last 24 hours) at 12/08/2018 1015 Last data filed at 12/08/2018 0400 Gross per 24 hour  Intake 1640.65 ml  Output 600 ml  Net 1040.65 ml   Filed Weights   12/01/18 1836 12/02/18 0012  Weight: 90 kg 83.7 kg    Examination:  General exam: Appears calm and comfortable  Respiratory system: Clear to auscultation. Respiratory effort normal.  Intermittent wheezes at bilateral lung bases worse on right Cardiovascular system: Regular rate and rhythm, no murmurs Gastrointestinal system: Soft, nondistended, no rebound or guarding, plus bowel sounds Central nervous system: Alert and oriented.  Grossly intact, moving all extremities Extremities: No lower extremity edema. Skin: Port site is clean dry and intact Psychiatry: Judgement and insight appear normal. Mood & affect appropriate.     Data Reviewed: I have personally reviewed following labs and imaging studies  CBC: Recent Labs  Lab 12/01/18 1918  12/04/18 0655 12/05/18 0553 12/06/18 0521 12/07/18 0622 12/08/18 0647  WBC 0.9*   < > 0.5* 0.4* 0.4* 0.9* 2.5*  NEUTROABS 0.7*  --   --   --  0.1* 0.2* 1.2*  HGB 9.1*   < > 6.8* 8.9* 9.3* 9.7* 9.2*  HCT 28.4*   < > 21.3* 27.3* 28.3* 29.9* 28.0*  MCV 86.9   < > 88.4 87.8 85.5 88.2 86.7  PLT 43*   < > 12* 40* 30* 32* 33*   < > = values in this interval not displayed.   Basic Metabolic Panel: Recent Labs  Lab 12/01/18 1918 12/02/18 0615 12/04/18 0623 12/05/18 0553 12/06/18 0521 12/07/18 0622 12/08/18 0647  NA 142 142  --  145 142 140 141  K 3.5 3.9  --  3.0* 3.1* 3.5 3.4*  CL 105 108  --  110 106 107 107  CO2 25 25  --  24 24 24 25   GLUCOSE 88 73  --   66* 95 71 94  BUN 32* 28*  --  14 12 11 14   CREATININE 1.70* 1.57* 1.39* 1.36* 1.33* 1.49* 1.58*  CALCIUM 8.7* 8.3*  --  8.3* 8.5* 8.5* 8.6*  MG 1.4*  --   --   --   --  1.2* 1.9  PHOS  --   --   --   --   --  1.8* 2.4*   GFR: Estimated Creatinine Clearance: 54.7 mL/min (A) (by C-G formula based on SCr of 1.58 mg/dL (H)). Liver Function Tests: Recent Labs  Lab 12/01/18 1918 12/02/18 0615 12/08/18 0647  AST 20 16 13*  ALT 21 16 11   ALKPHOS 64 48 54  BILITOT 0.7 0.8 0.6  PROT 6.8 5.6* 5.9*  ALBUMIN 3.1* 2.7* 2.7*   Recent Labs  Lab 12/01/18 1918  LIPASE 31   No results for input(s): AMMONIA in the last 168 hours. Coagulation Profile: Recent Labs  Lab 12/05/18 1242  INR 1.08   Cardiac Enzymes: Recent Labs  Lab 12/01/18 1918  TROPONINI <0.03   BNP (last 3 results) No results for input(s): PROBNP  in the last 8760 hours. HbA1C: No results for input(s): HGBA1C in the last 72 hours. CBG: Recent Labs  Lab 12/08/18 0035 12/08/18 0646  GLUCAP 95 89   Lipid Profile: Recent Labs    12/08/18 0647  TRIG 226*   Thyroid Function Tests: No results for input(s): TSH, T4TOTAL, FREET4, T3FREE, THYROIDAB in the last 72 hours. Anemia Panel: No results for input(s): VITAMINB12, FOLATE, FERRITIN, TIBC, IRON, RETICCTPCT in the last 72 hours. Sepsis Labs: Recent Labs  Lab 12/02/18 2130  PROCALCITON <0.10    Recent Results (from the past 240 hour(s))  Culture, blood (routine x 2)     Status: None   Collection Time: 12/01/18  9:33 PM  Result Value Ref Range Status   Specimen Description   Final    BLOOD PORTA CATH Performed at [facility], [address] [place], [place] [address]    Special Requests   Final    BOTTLES DRAWN AEROBIC AND ANAEROBIC Blood Culture adequate volume Performed at [facility], [address], [place], [place] [address]    Culture   Final    NO GROWTH 5 DAYS Performed at [facility], [address], [place], [place] [address]    Report Status 12/07/2018 FINAL  Final  Culture, blood (routine x 2)     Status: None   Collection Time: 12/02/18 12:00 AM  Result Value Ref Range Status   Specimen Description   Final    BLOOD RIGHT HAND Performed at [facility], [address] [place], [place] [address]    Special Requests   Final    BOTTLES DRAWN AEROBIC AND ANAEROBIC Blood Culture adequate volume Performed at [facility], [address], [place], [place] [address]    Culture   Final    NO GROWTH 5 DAYS Performed at [facility], [address], [place], [place] [address]    Report Status 12/07/2018 FINAL  Final  Urine culture     Status: None   Collection Time: 12/02/18 12:18 AM  Result Value Ref Range Status   Specimen Description URINE, CLEAN CATCH  Final   Special Requests   Final    NONE Performed at [facility], [address], [place], [place] [address]    Culture NO GROWTH  Final   Report Status 12/03/2018 FINAL  Final         Radiology Studies: No results found.      Scheduled Meds: . sodium chloride   Intravenous Once  . cholecalciferol  2,000 Units Oral Daily  . dronabinol  5 mg Oral BID AC  . FLUoxetine  40 mg Oral Daily  . insulin aspart  0-9 Units Subcutaneous Q6H  . metoprolol tartrate  2.5 mg Intravenous Q8H  . mirtazapine  30 mg Oral QHS  . mometasone-formoterol  2 puff Inhalation BID  . pantoprazole (PROTONIX) IV  40 mg Intravenous Q24H  . prazosin  1 mg Oral QHS  . PRUTECT   Topical BID  . ranolazine  500 mg Oral BID  . rosuvastatin  20 mg Oral Daily  . sodium chloride flush  10-40 mL Intracatheter Q12H  . sucralfate  1 g Oral TID WC & HS  . Tbo-filgastrim (GRANIX) SQ  480 mcg Subcutaneous q1800  . umeclidinium bromide  1 puff Inhalation Daily  . vitamin B-12  500 mcg Oral Daily  . zolpidem  5 mg Oral QHS   Continuous  Infusions: . sodium chloride Stopped (12/07/18 2235)  .  ceFEPime (MAXIPIME) IV 2 g (12/08/18 0901)  . fluconazole (DIFLUCAN) IV Stopped (12/07/18 1811)  . TPN ADULT (ION) 30 mL/hr at 12/08/18 0400     LOS: 6 days    Time spent: 35    Delaine Lame, MD [facility]  If 7PM-7AM, please contact night-coverage [email or web address] Password [email or web address] 12/08/2018, 10:15 AM

## 2018-12-08 NOTE — Anesthesia Procedure Notes (Signed)
Procedure Name: MAC Date/Time: 12/08/2018 2:08 PM Performed by: West Pugh, CRNA Pre-anesthesia Checklist: Patient identified, Emergency Drugs available, Suction available, Patient being monitored and Timeout performed Patient Re-evaluated:Patient Re-evaluated prior to induction Oxygen Delivery Method: Nasal cannula Preoxygenation: Pre-oxygenation with 100% oxygen Induction Type: IV induction Number of attempts: 1 Placement Confirmation: positive ETCO2 Dental Injury: Teeth and Oropharynx as per pre-operative assessment

## 2018-12-09 DIAGNOSIS — E440 Moderate protein-calorie malnutrition: Secondary | ICD-10-CM

## 2018-12-09 DIAGNOSIS — N183 Chronic kidney disease, stage 3 unspecified: Secondary | ICD-10-CM | POA: Diagnosis present

## 2018-12-09 LAB — CBC WITH DIFFERENTIAL/PLATELET
Abs Immature Granulocytes: 0.28 10*3/uL — ABNORMAL HIGH (ref 0.00–0.07)
Basophils Absolute: 0 10*3/uL (ref 0.0–0.1)
Basophils Relative: 0 %
Eosinophils Absolute: 0 10*3/uL (ref 0.0–0.5)
Eosinophils Relative: 1 %
HCT: 28.3 % — ABNORMAL LOW (ref 39.0–52.0)
Hemoglobin: 9.1 g/dL — ABNORMAL LOW (ref 13.0–17.0)
IMMATURE GRANULOCYTES: 5 %
LYMPHS ABS: 0.4 10*3/uL — AB (ref 0.7–4.0)
Lymphocytes Relative: 8 %
MCH: 28.8 pg (ref 26.0–34.0)
MCHC: 32.2 g/dL (ref 30.0–36.0)
MCV: 89.6 fL (ref 80.0–100.0)
Monocytes Absolute: 1.2 10*3/uL — ABNORMAL HIGH (ref 0.1–1.0)
Monocytes Relative: 22 %
Neutro Abs: 3.3 10*3/uL (ref 1.7–7.7)
Neutrophils Relative %: 64 %
Platelets: 44 10*3/uL — ABNORMAL LOW (ref 150–400)
RBC: 3.16 MIL/uL — ABNORMAL LOW (ref 4.22–5.81)
RDW: 15.3 % (ref 11.5–15.5)
WBC: 5.2 10*3/uL (ref 4.0–10.5)
nRBC: 0 % (ref 0.0–0.2)

## 2018-12-09 LAB — GLUCOSE, CAPILLARY
Glucose-Capillary: 131 mg/dL — ABNORMAL HIGH (ref 70–99)
Glucose-Capillary: 117 mg/dL — ABNORMAL HIGH (ref 70–99)
Glucose-Capillary: 113 mg/dL — ABNORMAL HIGH (ref 70–99)

## 2018-12-09 LAB — BASIC METABOLIC PANEL
Anion gap: 9 (ref 5–15)
BUN: 15 mg/dL (ref 6–20)
CO2: 25 mmol/L (ref 22–32)
Calcium: 8.7 mg/dL — ABNORMAL LOW (ref 8.9–10.3)
Chloride: 109 mmol/L (ref 98–111)
Creatinine, Ser: 1.46 mg/dL — ABNORMAL HIGH (ref 0.61–1.24)
GFR calc non Af Amer: 55 mL/min — ABNORMAL LOW (ref >60)
GLUCOSE: 132 mg/dL — AB (ref 70–99)
Potassium: 3.7 mmol/L (ref 3.5–5.1)
Sodium: 143 mmol/L (ref 135–145)

## 2018-12-09 LAB — PHOSPHORUS: Phosphorus: 1.9 mg/dL — ABNORMAL LOW (ref 2.5–4.6)

## 2018-12-09 LAB — BPAM PLATELET PHERESIS
Blood Product Expiration Date: 202001172359
ISSUE DATE / TIME: 202001171034
Unit Type and Rh: 6200

## 2018-12-09 LAB — PREPARE PLATELET PHERESIS: Unit division: 0

## 2018-12-09 LAB — MAGNESIUM: Magnesium: 1.7 mg/dL (ref 1.7–2.4)

## 2018-12-09 MED ORDER — MAGNESIUM SULFATE 2 GM/50ML IV SOLN
2.0000 g | Freq: Once | INTRAVENOUS | Status: AC
  Administered 2018-12-09: 2 g via INTRAVENOUS
  Filled 2018-12-09: qty 50

## 2018-12-09 MED ORDER — SODIUM CHLORIDE 0.9 % IV SOLN
20.0000 mmol | Freq: Once | INTRAVENOUS | Status: AC
  Administered 2018-12-09: 20 mmol via INTRAVENOUS
  Filled 2018-12-09: qty 6.67

## 2018-12-09 MED ORDER — NEBIVOLOL HCL 10 MG PO TABS
20.0000 mg | ORAL_TABLET | Freq: Every day | ORAL | Status: DC
  Administered 2018-12-09 – 2018-12-13 (×3): 20 mg via ORAL
  Filled 2018-12-09 (×6): qty 2

## 2018-12-09 MED ORDER — TRAVASOL 10 % IV SOLN
INTRAVENOUS | Status: AC
  Administered 2018-12-09: 18:00:00 via INTRAVENOUS
  Filled 2018-12-09: qty 960

## 2018-12-09 MED ORDER — FLUCONAZOLE 100 MG PO TABS
100.0000 mg | ORAL_TABLET | Freq: Every day | ORAL | Status: DC
  Administered 2018-12-09 – 2018-12-13 (×3): 100 mg via ORAL
  Filled 2018-12-09 (×6): qty 1

## 2018-12-09 MED ORDER — PANTOPRAZOLE SODIUM 40 MG PO TBEC
40.0000 mg | DELAYED_RELEASE_TABLET | Freq: Every day | ORAL | Status: DC
  Administered 2018-12-09 – 2018-12-12 (×4): 40 mg via ORAL
  Filled 2018-12-09 (×4): qty 1

## 2018-12-09 NOTE — Progress Notes (Signed)
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 intake, and found to have influenza B and possible right lower lobe pneumonia. Treated with Tamiflu (completed) and iv Cefepime (completed). All cultures negative. GI consulted. After 5 days off plavix and resolution of severe pancytopenia (with platelet transfusion),  EGD was done on 12/08/18, finding of severe esophagitis and esophageal stricture, recommend G tube placement, meanwhile TPN for nutrition.    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)   Odynophagia   N&V (nausea and vomiting)   Antineoplastic chemotherapy induced pancytopenia (HCC)   Esophageal stricture   Malnutrition of moderate degree   CKD (chronic kidney disease) stage 3, GFR 30-59 ml/min (HCC)   #) Failure to thrive/dysphagia/nausea/vomiting/odynophagia:  EGD on 1/17 identifying severe esophagitis and esophageal stricture, likely due to radiation esophagitis, which will take weeks to heal --pending placement of a G-tube, timing of G tube placement per GI -Clear liquid diet by mouth as tolerated -Continue TPN for now, pharmacy consult   #) Influenza B/right lower lobe pneumonia: Unclear how much contributing to his current presentation is influenza B and  this possible pneumonia.  The pneumonia is almost certainly postobstructive. -iv Cefepime completed today 12/09/18 -Tamiflu completed on 12/08/18 -Blood cultures from 12/02/2018 no growth to date -- pt is afebrile, wbc improving, culture negative, no hypoxia or SOB  #) Pancytopenia: Secondary to chemotherapy --improving, monitoring daily CBC -s/p 2 units of platelets on 12/08/18  #) Hypertension/hyperlipidemia: Currently patient has difficulty taking pills by mouth -Hold amlodipine 10 mg daily - Hold isosorbide mononitrate 120 mg daily -Hold nebivolol 20 mg daily - Continue rosuvastatin 20 mg daily - Hold hydralazine 25 mg twice daily -Continue IV metoprolol 2.5 mg every 8 hours  #) Coronary artery disease status post stent: Patient apparently is no longer a candidate for any nonsurgical interventions. -Continue aspirin 81 mg daily -Hold clopidogrel 75 mg daily -Continue beta-blocker, long-acting nitrate - Continue statin -Continue ranolazine thousand milligrams twice daily  #) COPD: -Continue L AMA/ICS/LABA -Continue PRN short-acting bronchodilators  #) Small cell lung cancer: Chemotherapy was on 11/23/2018 - consult oncology, Dr. Shirline Frees  #) BPH: -Continue prazosin  #) Pain/psych: -Continue dronabinol -Continue fluoxetine 40 mg daily -Continue prazosin 1 mg nightly  Fluids: Tolerating p.o., TPN Electrolytes: Monitor and supplement Nutrition: Per above  Prophylaxis: Thrombocytopenic. SCD  Disposition: Pending recovery of pancytopenia and improvement of nutrition intake, needs to be off TPN  Full code   Consultants:   Interventional radiology  Oncology, Dr. Shirline Frees  GI  Procedures:   EGD 12/08/18  Antimicrobials:   Completed Cefepime and Tamiflu   Subjective: Some sore throat. Still pending CBC as pt refused early labs this morning. Afebrile. No SOB or cough.   Objective: Vitals:   12/08/18 1500 12/08/18 2037 12/08/18 2124 12/09/18 0548  BP:  (!) 137/97  129/89 121/88 124/80  Pulse: 93 87 89 74  Resp: 18 16 16 18   Temp: 98.7 F (37.1 C) 98.7 F (37.1 C) 98.7 F (37.1 C) 98.4 F (36.9 C)  TempSrc: Oral Oral Oral Oral  SpO2: 97% 97% 93% 94%  Weight:      Height:        Intake/Output Summary (Last 24 hours) at 12/09/2018 1103 Last data filed at 12/09/2018 0944 Gross per 24 hour  Intake 2190.35 ml  Output 950 ml  Net 1240.35 ml   Filed Weights   12/01/18 1836 12/02/18 0012  Weight: 90 kg 83.7 kg    Examination:  General exam: Appears calm and comfortable  Respiratory system: Clear to auscultation. Respiratory effort normal. No wheezing Cardiovascular system: Regular rate and rhythm, no murmurs Gastrointestinal system: Soft, nondistended, no rebound or guarding, plus bowel sounds Central nervous system: Alert and oriented.  Grossly intact, moving all extremities Extremities: No lower extremity edema. Skin: Port site is clean dry and intact Psychiatry: Judgement and insight appear normal. Mood & affect appropriate.     Data Reviewed: I have personally reviewed following labs and imaging studies  CBC: Recent Labs  Lab 12/05/18 0553 12/06/18 0521 12/07/18 0622 12/08/18 0647 12/09/18 1002  WBC 0.4* 0.4* 0.9* 2.5* 5.2  NEUTROABS  --  0.1* 0.2* 1.2* 3.3  HGB 8.9* 9.3* 9.7* 9.2* 9.1*  HCT 27.3* 28.3* 29.9* 28.0* 28.3*  MCV 87.8 85.5 88.2 86.7 89.6  PLT 40* 30* 32* 33* 44*   Basic Metabolic Panel: Recent Labs  Lab 12/05/18 0553 12/06/18 0521 12/07/18 0622 12/08/18 0647 12/09/18 1002  NA 145 142 140 141 143  K 3.0* 3.1* 3.5 3.4* 3.7  CL 110 106 107 107 109  CO2 24 24 24 25 25   GLUCOSE 66* 95 71 94 132*  BUN 14 12 11 14 15   CREATININE 1.36* 1.33* 1.49* 1.58* 1.46*  CALCIUM 8.3* 8.5* 8.5* 8.6* 8.7*  MG  --   --  1.2* 1.9 1.7  PHOS  --   --  1.8* 2.4* 1.9*   GFR: Estimated Creatinine Clearance: 59.2 mL/min (A) (by C-G formula based on SCr of 1.46 mg/dL (H)). Liver Function Tests: Recent Labs    Lab 12/08/18 0647  AST 13*  ALT 11  ALKPHOS 54  BILITOT 0.6  PROT 5.9*  ALBUMIN 2.7*   No results for input(s): LIPASE, AMYLASE in the last 168 hours. No results for input(s): AMMONIA in the last 168 hours. Coagulation Profile: Recent Labs  Lab 12/05/18 1242  INR 1.08   Cardiac Enzymes: No results for input(s): CKTOTAL, CKMB, CKMBINDEX, TROPONINI in the last 168 hours. BNP (last 3 results) No results for input(s): PROBNP in the last 8760 hours. HbA1C: No results for input(s): HGBA1C in the last 72 hours. CBG: Recent Labs  Lab 12/08/18 0646 12/08/18 1122 12/08/18 1730 12/08/18 2358 12/09/18 0534  GLUCAP 89 106* 90 135* 131*   Lipid Profile: Recent Labs    12/08/18 0647  TRIG 226*   Thyroid Function Tests: No results for input(s): TSH, T4TOTAL, FREET4, T3FREE, THYROIDAB in the last 72 hours. Anemia Panel: No results for input(s): VITAMINB12, FOLATE, FERRITIN, TIBC, IRON, RETICCTPCT in the last 72 hours. Sepsis Labs: No results for input(s): PROCALCITON, LATICACIDVEN in the last 168 hours.  Recent Results (from the past 240 hour(s))  Culture, blood (routine x 2)     Status: None   Collection Time: 12/01/18  9:33 PM  Result Value Ref Range Status   Specimen  Description   Final    BLOOD PORTA CATH Performed at [facility], [address] [place], [place] [address]    Special Requests   Final    BOTTLES DRAWN AEROBIC AND ANAEROBIC Blood Culture adequate volume Performed at [facility], [address] [place], [place] [address]    Culture   Final    NO GROWTH 5 DAYS Performed at [facility], [address] [place], [place] [address]    Report Status 12/07/2018 FINAL  Final  Culture, blood (routine x 2)     Status: None   Collection Time: 12/02/18 12:00 AM  Result Value Ref Range Status   Specimen Description   Final    BLOOD RIGHT HAND Performed at [facility], [address] [place], [place]  [address]    Special Requests   Final    BOTTLES DRAWN AEROBIC AND ANAEROBIC Blood Culture adequate volume Performed at [facility], [address] [place], [place] [address]    Culture   Final    NO GROWTH 5 DAYS Performed at [facility], [address] [place], [place] [address]    Report Status 12/07/2018 FINAL  Final  Urine culture     Status: None   Collection Time: 12/02/18 12:18 AM  Result Value Ref Range Status   Specimen Description URINE, CLEAN CATCH  Final   Special Requests   Final    NONE Performed at [facility], [address], [place], [place] [address]    Culture NO GROWTH  Final   Report Status 12/03/2018 FINAL  Final         Radiology Studies: No results found.      Scheduled Meds: . cholecalciferol  2,000 Units Oral Daily  . dronabinol  5 mg Oral BID AC  . FLUoxetine  40 mg Oral Daily  . insulin aspart  0-9 Units Subcutaneous Q6H  . metoprolol tartrate  2.5 mg Intravenous Q8H  . mirtazapine  30 mg Oral QHS  . mometasone-formoterol  2 puff Inhalation BID  . pantoprazole (PROTONIX) IV  40 mg Intravenous Q24H  . prazosin  1 mg Oral QHS  . PRUTECT   Topical BID  . ranolazine  500 mg Oral BID  . rosuvastatin  20 mg Oral Daily  . sodium chloride flush  10-40 mL Intracatheter Q12H  . sucralfate  1 g Oral TID WC & HS  . umeclidinium bromide  1 puff Inhalation Daily  . vitamin B-12  500 mcg Oral Daily  . zolpidem  5 mg Oral QHS   Continuous Infusions: . sodium chloride Stopped (12/07/18 2235)  . fluconazole (DIFLUCAN) IV 100 mg (12/08/18 1600)  . magnesium sulfate 1 - 4 g bolus IVPB    . potassium PHOSPHATE IVPB (in mmol)    . TPN ADULT (ION) 60 mL/hr at 12/09/18 0300  . TPN ADULT (ION)       LOS: 7 days    Time spent: 35 min    Gala Murdoch, MD [facility]  If 7PM-7AM, please contact night-coverage [email or web address] Password [email or web address] 12/09/2018, 11:03 AM

## 2018-12-09 NOTE — Consult Note (Signed)
Chief Complaint: Patient was seen in consultation today for gastrostomy tube placement.  Referring Physician(s): Dr. Ardis Hughs  Supervising Physician: Markus Daft  Patient Status: [facility] - In-pt  History of Present Illness: Andrew West is a 53 y.o. male with a past medical history significant for anxiety, CAD, COPD, GERD, DM and recently diagnosed small cell lung cancer on chemotherapy and radiation. Patient underwent EGD 1/17 showing severe esophagitis and esophageal stricture. Request for gastrostomy tube placement due to dysphagia secondary to radiation.  Patient sleeping upon entry to room, easily arouses to voice cues. States he feels ok, wondering if he can go home after feeding tube is placed. Denies any complaints currently.   Past Medical History:  Diagnosis Date  . Anxiety   . CAD (coronary artery disease)    a. s/p multiple PCIs with last cath 11/2016 with severe multivessel CAD, s/p PCTA to LCx but unable to pass stent  . Chronic leg pain    bilateral  . Chronic lower back pain   . COPD (chronic obstructive pulmonary disease) ([place])   . Depression   . GERD (gastroesophageal reflux disease)    Takes Dexilant  . HLD (hyperlipidemia)   . Hypertension   . Rhabdomyolysis    h/o, r/t statins  . Sleep apnea    "can't tolerate mask" (12/16/2016)  . Type II diabetes mellitus ([place])     Past Surgical History:  Procedure Laterality Date  . BACK SURGERY    . BRONCHIAL BIOPSY  08/25/2018   Procedure: BRONCHIAL BIOPSIES;  Surgeon: Garner Nash, DO;  Location: [facility];  Service: Cardiopulmonary;;  . CARDIAC CATHETERIZATION N/A 09/25/2015   Procedure: Left Heart Cath and Coronary Angiography;  Surgeon: Leonie Man, MD;  Location: [place] [facility];  Service: Cardiovascular;  Laterality: N/A;  . CARDIAC CATHETERIZATION N/A 12/16/2016   Procedure: Left Heart Cath and Coronary Angiography;  Surgeon: Leonie Man, MD;  Location: [place] [facility];  Service:  Cardiovascular;  Laterality: N/A;  . CARDIAC CATHETERIZATION N/A 12/16/2016   Procedure: Coronary Balloon Angioplasty;  Surgeon: Leonie Man, MD;  Location: [place] [facility];  Service: Cardiovascular;  Laterality: N/A;  . COLONOSCOPY W/ POLYPECTOMY    . CORONARY ANGIOPLASTY  09/25/2015   mid cir & om  . CORONARY ANGIOPLASTY WITH STENT PLACEMENT  10/09/2001   PTCA & stenting of mid AV circumflex; 2.5x61m Pixel stent  . CORONARY ANGIOPLASTY WITH STENT PLACEMENT  12/13/2001   PCI with stent to mid L circumflex, 95% stenosis to 0% residual  . CORONARY ANGIOPLASTY WITH STENT PLACEMENT  10/10/2003   PCI to mid AV circumflex; LAD 30% disease; RCA 100% occluded prox.  . CORONARY ANGIOPLASTY WITH STENT PLACEMENT  09/01/2011   PCI with stenting with bare metal stent to mid AV groove circumflex and PDA  . CORONARY ANGIOPLASTY WITH STENT PLACEMENT  10/17/2011   cutting balloon angioplasty of ostial lateral OM1 branch and bifurcation AV groove circumflex OM junction; stenosis reduced to 0%  . ENDOBRONCHIAL ULTRASOUND Bilateral 08/25/2018   Procedure: ENDOBRONCHIAL ULTRASOUND;  Surgeon: IGarner Nash DO;  Location: [facility];  Service: Cardiopulmonary;  Laterality: Bilateral;  . EXCISIONAL HEMORRHOIDECTOMY    . FINE NEEDLE ASPIRATION  08/25/2018   Procedure: FINE NEEDLE ASPIRATION;  Surgeon: IGarner Nash DO;  Location: [facility];  Service: Cardiopulmonary;;  . FLEXIBLE BRONCHOSCOPY  08/25/2018   Procedure: FLEXIBLE BRONCHOSCOPY;  Surgeon: IGarner Nash DO;  Location: [facility];  Service: Cardiopulmonary;;  . IR  IMAGING GUIDED PORT INSERTION  09/29/2018  . LEFT HEART CATHETERIZATION WITH CORONARY ANGIOGRAM N/A 10/18/2011   Procedure: LEFT HEART CATHETERIZATION WITH CORONARY ANGIOGRAM;  Surgeon: Leonie Man, MD;  Location: [facility] CATH LAB;  Service: Cardiovascular;  Laterality: N/A;  . LUMBAR LAMINECTOMY/DECOMPRESSION MICRODISCECTOMY  03/31/2012   Procedure: LUMBAR  LAMINECTOMY/DECOMPRESSION MICRODISCECTOMY 1 LEVEL;  Surgeon: Charlie Pitter, MD;  Location: [place] [facility];  Service: Neurosurgery;  Laterality: Left;  . TRANSTHORACIC ECHOCARDIOGRAM  07/28/2011   EF 55-65%; LVH, grade 1 diastolic dysfunction;     Allergies: Iohexol  Medications: Prior to Admission medications   Medication Sig Start Date End Date Taking? Authorizing Provider  albuterol (PROAIR HFA) 108 (90 Base) MCG/ACT inhaler Inhale 1 puff into the lungs every 6 (six) hours as needed for wheezing or shortness of breath. 08/29/18  Yes [name], MD  amLODipine (NORVASC) 10 MG tablet Take 1 tablet (10 mg total) by mouth daily. 04/20/18  Yes [name], MD  aspirin 81 MG tablet Take 1 tablet (81 mg total) daily by mouth. 10/10/17  Yes Newlin, Enobong, MD  Cholecalciferol (VITAMIN D) 2000 units tablet Take 1 tablet (2,000 Units total) by mouth daily. 08/04/18  Yes Jamse Arn, MD  FLUoxetine (PROZAC) 40 MG capsule Take 1 capsule (40 mg total) by mouth daily. 04/20/18  Yes Newlin, Charlane Ferretti, MD  Fluticasone-Salmeterol (ADVAIR) 100-50 MCG/DOSE AEPB Inhale 1 puff into the lungs 2 (two) times daily. 07/25/18  Yes [name], MD  mirtazapine (REMERON) 30 MG tablet Take 1 tablet (30 mg total) by mouth at bedtime. 08/31/18  Yes Curt Bears, MD  nicotine (NICODERM CQ) 21 mg/24hr patch Place 1 patch (21 mg total) onto the skin daily. 08/31/18  Yes Curt Bears, MD  pantoprazole (PROTONIX) 40 MG tablet Take 1 tablet (40 mg total) by mouth daily. 10/11/18  Yes Aline August, MD  potassium chloride 20 MEQ TBCR Take 10 mEq by mouth daily. 11/13/18  Yes Curt Bears, MD  prazosin (MINIPRESS) 1 MG capsule Take 1 capsule (1 mg total) by mouth at bedtime. For nightmares 07/25/18  Yes [name], MD  ranolazine (RANEXA) 1000 MG SR tablet Take 1 tablet (1,000 mg total) by mouth 2 (two) times daily. 05/24/17  Yes Hilty, Nadean Corwin, MD  rosuvastatin (CRESTOR) 20 MG tablet Take 1 tablet (20 mg  total) by mouth daily. 07/26/18  Yes [name], MD  sucralfate (CARAFATE) 1 g tablet Take 1 tablet by mouth 4 (four) times daily -  before meals and at bedtime. 09/23/18  Yes [provider]  tiotropium (SPIRIVA HANDIHALER) 18 MCG inhalation capsule Place 1 capsule (18 mcg total) into inhaler and inhale daily. 04/20/18  Yes [name], MD  vitamin B-12 (CYANOCOBALAMIN) 500 MCG tablet Take 1 tablet (500 mcg total) by mouth daily. 06/30/18  Yes Jamse Arn, MD  zolpidem (AMBIEN) 5 MG tablet Take 1 tablet (5 mg total) by mouth at bedtime as needed for sleep. Patient taking differently: Take 5 mg by mouth at bedtime.  09/14/18  Yes [name], MD  baclofen (LIORESAL) 10 MG tablet Take 1 tablet (10 mg total) by mouth 3 (three) times daily as needed for muscle spasms. 10/11/18   Aline August, MD  benzonatate (TESSALON) 100 MG capsule Take 1 capsule (100 mg total) by mouth every 8 (eight) hours. Patient taking differently: Take 100 mg by mouth 3 (three) times daily as needed for cough.  09/19/18   Curt Bears, MD  Blood Glucose Monitoring Suppl (ACCU-CHEK AVIVA PLUS)  w/Device KIT Use as dircted 07/12/17   [name], MD  clopidogrel (PLAVIX) 75 MG tablet Take 1 tablet (75 mg total) by mouth daily. YOU MAY RESTART 08/26/2018 08/25/18   Icard, Leory Plowman L, DO  diclofenac sodium (VOLTAREN) 1 % GEL Apply 2 g topically 4 (four) times daily. Patient taking differently: Apply 2 g topically 4 (four) times daily as needed (pain).  04/20/18   [name], MD  dronabinol (MARINOL) 5 MG capsule Take 1 capsule (5 mg total) by mouth 2 (two) times daily before a meal. 10/18/18   Tyler Pita, MD  fluticasone [facility]) 50 MCG/ACT nasal spray Place 2 sprays into both nostrils daily as needed for allergies. 10/11/18   Aline August, MD  glucose blood (ACCU-CHEK AVIVA) test strip Use as instructed 07/12/17   [name], MD  hydrALAZINE (APRESOLINE) 25 MG tablet Take 1 tablet (25 mg  total) by mouth 2 (two) times daily. 04/20/18   [name], MD  HYDROcodone-acetaminophen (NORCO) 5-325 MG tablet Take 1 tablet by mouth every 6 (six) hours as needed for moderate pain. 11/13/18   Curt Bears, MD  hydrocortisone-pramoxine [facility]) 2.5-1 % rectal cream Place 1 application rectally as needed. Patient taking differently: Place 1 application rectally as needed for hemorrhoids or anal itching.  10/11/18   Aline August, MD  isosorbide mononitrate (IMDUR) 120 MG 24 hr tablet Take 1 tablet (120 mg total) by mouth daily. 04/20/18   [name], MD  lidocaine-prilocaine (EMLA) cream Apply 1 application topically as needed. Apply 1 tsp on skin over port site one hour prior to chemotherapy. Cover with plastic wrap. Do not rub in medication. 11/27/18   Curt Bears, MD  LORazepam (ATIVAN) 0.5 MG tablet 1 tab po q 4-6 hours prn or 1 tab po 30 minutes prior to radiation 08/30/18   Hayden Pedro, PA-C  Nebivolol HCl 20 MG TABS Take 1 tablet (20 mg total) by mouth daily. 01/26/18   Hilty, Nadean Corwin, MD  nitroGLYCERIN (NITROSTAT) 0.4 MG SL tablet Place 1 tablet (0.4 mg total) under the tongue every 5 (five) minutes as needed for chest pain. Patient taking differently: Place 0.4 mg under the tongue every 5 (five) minutes as needed for chest pain. Chest pain 12/24/16   Ahmed Prima, Fransisco Hertz, PA-C  ondansetron (ZOFRAN) 8 MG tablet Take 1 tablet (8 mg total) by mouth every 8 (eight) hours as needed for nausea or vomiting. 09/20/18   Curt Bears, MD  prochlorperazine (COMPAZINE) 10 MG tablet Take 1 tablet (10 mg total) by mouth every 6 (six) hours as needed for nausea or vomiting. 10/23/18   Curt Bears, MD  promethazine (PHENERGAN) 25 MG tablet Take 1 tablet (25 mg total) by mouth every 6 (six) hours as needed for nausea or vomiting. 10/22/18   Deno Etienne, DO     Family History  Problem Relation Age of Onset  . Heart attack Father   . Hypertension Mother   . Diabetes  Mother   . Heart disease Brother        x 3   . Heart attack Brother        deceased  . Hypertension Sister   . Diabetes Sister   . Anesthesia problems Neg Hx   . Hypotension Neg Hx   . Malignant hyperthermia Neg Hx   . Pseudochol deficiency Neg Hx     Social History   Socioeconomic History  . Marital status: Married    Spouse name: Not on file  . Number of  children: Not on file  . Years of education: Not on file  . Highest education level: Not on file  Occupational History  . Not on file  Social Needs  . Financial resource strain: Not on file  . Food insecurity:    Worry: Not on file    Inability: Not on file  . Transportation needs:    Medical: No    Non-medical: No  Tobacco Use  . Smoking status: Former Smoker    Packs/day: 0.25    Years: 25.00    Pack years: 6.25    Types: Cigarettes    Last attempt to quit: 05/24/2015    Years since quitting: 3.5  . Smokeless tobacco: Never Used  Substance and Sexual Activity  . Alcohol use: No    Alcohol/week: 0.0 standard drinks  . Drug use: No  . Sexual activity: Yes  Lifestyle  . Physical activity:    Days per week: Not on file    Minutes per session: Not on file  . Stress: Not on file  Relationships  . Social connections:    Talks on phone: Not on file    Gets together: Not on file    Attends religious service: Not on file    Active member of club or organization: Not on file    Attends meetings of clubs or organizations: Not on file    Relationship status: Not on file  Other Topics Concern  . Not on file  Social History Narrative  . Not on file     Review of Systems: A 12 point ROS discussed and pertinent positives are indicated in the HPI above.  All other systems are negative.  Review of Systems  Constitutional: Positive for appetite change and fatigue. Negative for chills and fever.  HENT: Positive for sore throat and trouble swallowing.   Respiratory: Positive for cough and shortness of breath (with  exertion).   Cardiovascular: Negative for chest pain.  Gastrointestinal: Negative for abdominal pain, diarrhea, nausea and vomiting.  Genitourinary: Negative for dysuria.  Neurological: Negative for dizziness and syncope.  Psychiatric/Behavioral: Negative for confusion.    Vital Signs: BP (!) 137/98 (BP Location: Left Arm)   Pulse 77   Temp 97.9 F (36.6 C) (Oral)   Resp 18   Ht _0  (1.753 m)   Wt 184 lb 8.4 oz (83.7 kg)   SpO2 94%   BMI 27.25 kg/m   Physical Exam Vitals signs and nursing note reviewed.  Constitutional:      General: He is not in acute distress.    Appearance: He is ill-appearing.  HENT:     Head: Normocephalic.  Cardiovascular:     Rate and Rhythm: Normal rate and regular rhythm.     Comments: (+) port Pulmonary:     Effort: Pulmonary effort is normal.     Comments: Decreased breath sounds bilaterally Abdominal:     General: There is no distension.     Tenderness: There is no abdominal tenderness.  Skin:    General: Skin is warm and dry.  Neurological:     Mental Status: He is alert and oriented to person, place, and time.  Psychiatric:        Mood and Affect: Mood normal.        Behavior: Behavior normal.        Thought Content: Thought content normal.        Judgment: Judgment normal.      MD Evaluation Airway: WNL Heart: WNL Abdomen:  WNL Chest/ Lungs: WNL ASA  Classification: 4 Mallampati/Airway Score: Two   Imaging: Ct Abdomen Pelvis Wo Contrast  Result Date: 12/01/2018 CLINICAL DATA:  Abdominal pain and nausea.  History of lung cancer. EXAM: CT ABDOMEN AND PELVIS WITHOUT CONTRAST TECHNIQUE: Multidetector CT imaging of the abdomen and pelvis was performed following the standard protocol without IV contrast. COMPARISON:  CT abdomen pelvis dated October 31, 2018. FINDINGS: Lower chest: Unchanged peripheral nodular consolidation in the right lower lobe. Hepatobiliary: No focal liver abnormality is seen. Small layering gallstones  versus sludge. No gallbladder wall thickening or biliary dilatation. Pancreas: Unremarkable. No pancreatic ductal dilatation or surrounding inflammatory changes. Spleen: Normal in size without focal abnormality. Adrenals/Urinary Tract: Adrenal glands are unremarkable. Kidneys are normal, without renal calculi, focal lesion, or hydronephrosis. Bladder is unremarkable. Stomach/Bowel: Stomach is within normal limits. Appendix appears normal. No evidence of bowel wall thickening, distention, or inflammatory changes. Vascular/Lymphatic: Aortoiliac atherosclerosis. No enlarged abdominal or pelvic lymph nodes. Reproductive: Prostate is unremarkable. Other: Unchanged small fat containing umbilical and right inguinal hernias. No free fluid or pneumoperitoneum. Musculoskeletal: No acute or significant osseous findings. IMPRESSION: 1.  No acute intra-abdominal process. 2. Unchanged peripheral nodular consolidation in the right lower lobe. 3.  Aortic atherosclerosis (ICD10-I70.0). Electronically Signed   By: Titus Dubin M.D.   On: 12/01/2018 20:09   Dg Chest 2 View  Result Date: 12/01/2018 CLINICAL DATA:  Weakness.  History of lung cancer. EXAM: CHEST - 2 VIEW COMPARISON:  CT chest from same day. Chest x-ray dated November 02, 2018. FINDINGS: Unchanged left chest wall port catheter. The heart size and mediastinal contours are within normal limits. Normal pulmonary vascularity. Patchy, nodular consolidation in the right lower lobe, better evaluated on chest CT from same day. No pleural effusion or pneumothorax. No acute osseous abnormality. IMPRESSION: 1. Patchy, nodular consolidation in the right lower lobe, better evaluated on chest CT from same day. Electronically Signed   By: Titus Dubin M.D.   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 Restaging.  EXAM: CT CHEST WITHOUT CONTRAST TECHNIQUE: Multidetector CT imaging of the chest was performed following the standard protocol without IV contrast. COMPARISON:  10/20/2018 chest CT. FINDINGS: Cardiovascular: Normal heart size. No significant pericardial effusion/thickening. Three-vessel coronary atherosclerosis. Left internal jugular Port-A-Cath terminates at the cavoatrial junction. Atherosclerotic nonaneurysmal thoracic aorta. Normal caliber pulmonary arteries. Mediastinum/Nodes: No discrete thyroid nodules. Unremarkable esophagus. No axillary adenopathy. Mildly enlarged 1.5 cm lower right paratracheal node (series 2/image 61), previously 1.5 cm using similar measurement technique, stable. Mildly enlarged 1.2 cm subcarinal node (series 2/image 70), stable. No new pathologically enlarged mediastinal nodes. No discrete hilar adenopathy on this noncontrast scan. Lungs/Pleura: No pneumothorax. No pleural effusion. Mild-to-moderate centrilobular and paraseptal emphysema with prominent diffuse bronchial wall thickening. Previously described 2.3 cm focus of consolidation in the anterior peripheral right lower lobe on 10/20/2018 chest CT study has resolved with small residual linear scar in this location. New patchy irregular nodular foci of consolidation in the posterior right lower lobe, largest 2.3 x 2.2 cm (series 5/image 110) with some central ground-glass density. No additional significant pulmonary nodules. Upper abdomen: No acute abnormality. Musculoskeletal: No aggressive appearing focal osseous lesions. Mild thoracic spondylosis. IMPRESSION: 1. Right paratracheal and subcarinal lymphadenopathy is stable. No new or progressive thoracic adenopathy. 2. Waxing and waning irregular nodular foci of consolidation at the periphery  of the right lung base. Differential considerations include cryptogenic organizing pneumonia and recurrent infectious/inflammatory pneumonia (including aspiration pneumonia), with metastatic  spread considered less likely but not entirely excluded. Continued close chest CT follow-up advised. Aortic Atherosclerosis (ICD10-I70.0) and Emphysema (ICD10-J43.9). Electronically Signed   By: Ilona Sorrel M.D.   On: 12/01/2018 11:36    Labs:  CBC: Recent Labs    12/06/18 0521 12/07/18 0622 12/08/18 0647 12/09/18 1002  WBC 0.4* 0.9* 2.5* 5.2  HGB 9.3* 9.7* 9.2* 9.1*  HCT 28.3* 29.9* 28.0* 28.3*  PLT 30* 32* 33* 44*    COAGS: Recent Labs    08/17/18 1201 09/29/18 1339 12/05/18 1242  INR 1.1* 1.04 1.08  APTT 33.6* 28  --     BMP: Recent Labs    12/06/18 0521 12/07/18 0622 12/08/18 0647 12/09/18 1002  NA 142 140 141 143  K 3.1* 3.5 3.4* 3.7  CL 106 107 107 109  CO2 _0 GLUCOSE 95 71 94 132*  BUN _1 CALCIUM 8.5* 8.5* 8.6* 8.7*  CREATININE 1.33* 1.49* 1.58* 1.46*  GFRNONAA >60 53* 50* 55*  GFRAA >60 >60 57* >60    LIVER FUNCTION TESTS: Recent Labs    11/27/18 0852 12/01/18 1918 12/02/18 0615 12/08/18 0647  BILITOT 0.5 0.7 0.8 0.6  AST 14* 20 16 13*  ALT _2 ALKPHOS 67 64 48 54  PROT 6.7 6.8 5.6* 5.9*  ALBUMIN 2.9* 3.1* 2.7* 2.7*    TUMOR MARKERS: No results for input(s): AFPTM, CEA, CA199, CHROMGRNA in the last 8760 hours.  Assessment and Plan:  Patient with recently diagnosed small cell lung cancer currently undergoing chemotherapy and radiation now with severe dysphagia. EGD 1/17 shows severe esophagitis secondary to radiation. Request made to IR for gastrostomy tube placement. Patient has been approved for direct puncture g-tube placement by Dr. Anselm Pancoast - tentatively planned for 1/20.  Patient to be NPO/tube feeds held after midnight on 1/20, hold heparin/lovenox, pre-procedure labs ordered, IR will call for patient when ready.  Risks and benefits discussed with the patient including, but not limited to the need for a barium enema during the procedure, bleeding, infection, peritonitis, or damage to adjacent  structures.  All of the patient's questions were answered, patient is agreeable to proceed.  Consent signed and in chart.  Thank you for this interesting consult.  I greatly enjoyed meeting CADIN LUKA and look forward to participating in their care.  A copy of this report was sent to the requesting provider on this date.  Electronically Signed: Joaquim Nam, PA-C 12/09/2018, 4:50 PM   I spent a total of 20 Minutesin face to face in clinical consultation, greater than 50% of which was counseling/coordinating care for gastrostomy tube placement.

## 2018-12-09 NOTE — Progress Notes (Addendum)
PHARMACY - ADULT TOTAL PARENTERAL NUTRITION CONSULT NOTE   Pharmacy Consult for TPN Indication: intolerance of enteral feeding, unable to place G-tube  Patient Measurements: Height: 5\' 9"  (175.3 cm) Weight: 184 lb 8.4 oz (83.7 kg) IBW/kg (Calculated) : 70.7 TPN AdjBW (KG): 83.7 Body mass index is 27.25 kg/m. Usual Weight: 105-115 kg (20-30 kg wt loss since Oct 2019)  HPI: 26 yoM with PMH SCLC with recent chemo and pancytopenia, HTN, CAD, admitted with n/v and weakness. Found to have recurrent esophageal stenosis d/t malignancy vs radiation. Holding Plavix for possible G-tube placement, but still with low WBC and Plt following chemo so unable to place in the near future. Pharmacy to dose TPN as patient has been without adequate nutrition for several weeks  Central access: Chemo port, single lumen TPN start date: 1/16  Significant events:  1/17: EGD - severely inflamed esophagus; friable, oozing mucosa w/ sloughing of mucosa- likely radiation esophagitis 1/18 tentative plan for G tube placement early next week   ASSESSMENT                                                                                                           Current Nutrition: NPO/CLD  IVF: none  Today, 12/09/2018:  Glucose (CBG goal 100-150) -90-131 - acceptable  Electrolytes -  K 3.7 after 4 runs of K yesterday; Mag 1.7, phos 1.9  Renal - AKI on admission; SCr 1.58 > 1.46  LFTs - WNL on 1/17 except low albumin  TGs - elevated at 226 but < 400 currently (1/17)  Prealbumin - moderately low  11.5 (1/17)  NUTRITIONAL GOALS                                                                                             RD recs:  Kcal:  2500-2700 Protein:  125-135g Fluid:  2.5L/day  PLAN                                                                                                                          2 gm mag and Kphos 20 mM (~ 29 Meq K)  At 1800 today:  Advance TPN to 80  ml/hr; if G-tube placement  appears imminent, would hold off on advancing to goal rate of 100 ml/hr. If not likely to be done within next few days, would advance to goal as tolerated  TPN at goal rate of 100 ml/hr provides 120 g of protein, 360 g of dextrose, and 72 g of lipids for a total of 2424 kcals meeting 97% of patient needs   MVI, trace elements in TPN  Electrolytes in TPN: standard concentration; Cl:Ac 1:1  Continue sensitive SSI with q6 hr CBG checks, wean as able if not utilizing  TPN lab panels on Mondays & Thursdays; BMP, Mg, Phos tomorrow  Taking PO meds> fluconazole and PPI changed to PO, consider changing IV lopressor to PO  Eudelia Bunch, Pharm.D [phone] 12/09/2018 7:49 AM

## 2018-12-09 NOTE — Progress Notes (Signed)
I notified pharmacy for 2nd tube of Protect emulsion cream for patient tonight.  Patient states that the cream was taken from his room on another shift.  Previous RN attempted to locate and could not find cream.  Pt. Reports tube was full.  He does not want to be charged for another tube.Andrew West

## 2018-12-09 NOTE — Progress Notes (Signed)
PT REFUSED LABS THIS MORNING, WANT THEM DONE AT LATER TIME

## 2018-12-10 LAB — CBC
HEMATOCRIT: 26.2 % — AB (ref 39.0–52.0)
HEMOGLOBIN: 8.3 g/dL — AB (ref 13.0–17.0)
MCH: 28.6 pg (ref 26.0–34.0)
MCHC: 31.7 g/dL (ref 30.0–36.0)
MCV: 90.3 fL (ref 80.0–100.0)
Platelets: 44 10*3/uL — ABNORMAL LOW (ref 150–400)
RBC: 2.9 MIL/uL — ABNORMAL LOW (ref 4.22–5.81)
RDW: 15.7 % — AB (ref 11.5–15.5)
WBC: 4.5 10*3/uL (ref 4.0–10.5)
nRBC: 0 % (ref 0.0–0.2)

## 2018-12-10 LAB — GLUCOSE, CAPILLARY
GLUCOSE-CAPILLARY: 118 mg/dL — AB (ref 70–99)
Glucose-Capillary: 106 mg/dL — ABNORMAL HIGH (ref 70–99)
Glucose-Capillary: 107 mg/dL — ABNORMAL HIGH (ref 70–99)
Glucose-Capillary: 108 mg/dL — ABNORMAL HIGH (ref 70–99)
Glucose-Capillary: 114 mg/dL — ABNORMAL HIGH (ref 70–99)

## 2018-12-10 LAB — MAGNESIUM: Magnesium: 1.8 mg/dL (ref 1.7–2.4)

## 2018-12-10 LAB — BASIC METABOLIC PANEL
Anion gap: 6 (ref 5–15)
BUN: 20 mg/dL (ref 6–20)
CO2: 26 mmol/L (ref 22–32)
Calcium: 8.7 mg/dL — ABNORMAL LOW (ref 8.9–10.3)
Chloride: 114 mmol/L — ABNORMAL HIGH (ref 98–111)
Creatinine, Ser: 1.39 mg/dL — ABNORMAL HIGH (ref 0.61–1.24)
GFR calc Af Amer: >60 mL/min (ref >60)
GFR calc non Af Amer: 58 mL/min — ABNORMAL LOW (ref >60)
Glucose, Bld: 127 mg/dL — ABNORMAL HIGH (ref 70–99)
POTASSIUM: 3.8 mmol/L (ref 3.5–5.1)
Sodium: 146 mmol/L — ABNORMAL HIGH (ref 135–145)

## 2018-12-10 LAB — PHOSPHORUS: PHOSPHORUS: 3.3 mg/dL (ref 2.5–4.6)

## 2018-12-10 MED ORDER — TRAVASOL 10 % IV SOLN
INTRAVENOUS | Status: AC
  Administered 2018-12-10: 18:00:00 via INTRAVENOUS
  Filled 2018-12-10: qty 1200

## 2018-12-10 MED ORDER — MAGNESIUM SULFATE 2 GM/50ML IV SOLN
2.0000 g | Freq: Once | INTRAVENOUS | Status: AC
  Administered 2018-12-10: 2 g via INTRAVENOUS
  Filled 2018-12-10: qty 50

## 2018-12-10 MED ORDER — ADULT MULTIVITAMIN W/MINERALS CH
1.0000 | ORAL_TABLET | Freq: Every day | ORAL | Status: DC
  Administered 2018-12-10 – 2018-12-13 (×2): 1 via ORAL
  Filled 2018-12-10 (×5): qty 1

## 2018-12-10 NOTE — Progress Notes (Addendum)
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 intake, and found to have influenza B and possible right lower lobe pneumonia.   Treated with Tamiflu (completed) and iv Cefepime (completed). All cultures negative.   GI consulted. After 5 days off plavix and resolution of severe pancytopenia (with platelet transfusion),  EGD was done on 12/08/18, finding of severe esophagitis and esophageal stricture, recommend G tube placement (tomorrow?), meanwhile po clear liquid diet & TPN for nutrition.    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)   Odynophagia   N&V (nausea and vomiting)   Antineoplastic chemotherapy induced pancytopenia (HCC)   Esophageal stricture   Malnutrition of moderate degree   CKD (chronic kidney disease) stage 3, GFR 30-59 ml/min (HCC)   #) Failure to thrive/dysphagia/nausea/vomiting/odynophagia:  EGD on 1/17 identifying severe esophagitis and esophageal stricture, likely due to radiation esophagitis, which will take weeks to heal --pending placement of a G-tube (need a direct puncture G tube per IR), timing of G tube placement per  IR -Clear liquid diet by mouth as tolerated -Continue TPN for now, pharmacy consult   #) Influenza B/right lower lobe  pneumonia: Unclear how much contributing to his current presentation is influenza B and this possible pneumonia.  The pneumonia is almost certainly postobstructive. -iv Cefepime completed on 12/09/18 -Tamiflu completed on 12/08/18 -Blood cultures from 12/02/2018 no growth to date -- pt is afebrile, wbc normal, culture negative, no hypoxia or SOB  #) Pancytopenia: Secondary to chemotherapy --improving, monitoring daily CBC -s/p 2 units of platelets on 12/08/18  #) Hypertension/hyperlipidemia: Currently patient has difficulty taking pills by mouth; BP overall controlled -Hold amlodipine 10 mg daily - Hold isosorbide mononitrate 120 mg daily -Hold nebivolol 20 mg daily - Continue rosuvastatin 20 mg daily - Hold hydralazine 25 mg twice daily -Continue IV metoprolol 2.5 mg every 8 hours  #) Coronary artery disease status post stent: Patient apparently is no longer a candidate for any nonsurgical interventions. -Continue aspirin 81 mg daily -Hold clopidogrel 75 mg daily -Continue beta-blocker, long-acting nitrate - Continue statin -Continue ranolazine thousand milligrams twice daily  #) COPD: -Continue L AMA/ICS/LABA -Continue PRN short-acting bronchodilators  #) Small cell lung cancer: Chemotherapy was on 11/23/2018 - consult oncology, Dr. Shirline Frees  #) BPH: -Continue prazosin  #) Pain/psych: -Continue dronabinol -Continue fluoxetine 40 mg daily -Continue prazosin 1 mg nightly  Fluids: Tolerating p.o. liquid, TPN Electrolytes: Monitor and supplement Nutrition: Per above  Prophylaxis: Thrombocytopenic. SCD  Disposition: Pending recovery of pancytopenia and improvement of nutrition intake, needs to be off TPN and pending G tube placement  Full code   Consultants:   Interventional radiology  Oncology, Dr. Shirline Frees  GI  Procedures:   EGD 12/08/18  Antimicrobials:   Completed Cefepime and Tamiflu   Subjective: mild sore throat and odynophagia when swallowing  clear  liquid diet. Pending today's labs (RN is drawing blood now). Afebrile. No SOB or cough. No other complaints.   Objective: Vitals:   12/09/18 1201 12/09/18 2003 12/09/18 2020 12/10/18 0622  BP:  (!) 143/99    Pulse:  78    Resp:  14    Temp:  98.3 F (36.8 C)    TempSrc:  Oral    SpO2: 94% 97% 93% 98%  Weight:      Height:        Intake/Output Summary (Last 24 hours) at 12/10/2018 0929 Last data filed at 12/10/2018 0247 Gross per 24 hour  Intake 1861.62 ml  Output 1775 ml  Net 86.62 ml   Filed Weights   12/01/18 1836 12/02/18 0012  Weight: 90 kg 83.7 kg    Examination:  General exam: Appears calm and comfortable  Respiratory system: Clear to auscultation. Respiratory effort normal. No wheezing Cardiovascular system: Regular rate and rhythm, no murmurs Gastrointestinal system: Soft, nondistended, no rebound or guarding, plus bowel sounds Central nervous system: Alert and oriented.  Grossly intact, moving all extremities Extremities: No lower extremity edema. Skin: Port site is clean dry and intact Psychiatry: Judgement and insight appear normal. Mood & affect appropriate.     Data Reviewed: I have personally reviewed following labs and imaging studies  CBC: Recent Labs  Lab 12/05/18 0553 12/06/18 0521 12/07/18 0622 12/08/18 0647 12/09/18 1002  WBC 0.4* 0.4* 0.9* 2.5* 5.2  NEUTROABS  --  0.1* 0.2* 1.2* 3.3  HGB 8.9* 9.3* 9.7* 9.2* 9.1*  HCT 27.3* 28.3* 29.9* 28.0* 28.3*  MCV 87.8 85.5 88.2 86.7 89.6  PLT 40* 30* 32* 33* 44*   Basic Metabolic Panel: Recent Labs  Lab 12/05/18 0553 12/06/18 0521 12/07/18 0622 12/08/18 0647 12/09/18 1002  NA 145 142 140 141 143  K 3.0* 3.1* 3.5 3.4* 3.7  CL 110 106 107 107 109  CO2 24 24 24 25 25   GLUCOSE 66* 95 71 94 132*  BUN 14 12 11 14 15   CREATININE 1.36* 1.33* 1.49* 1.58* 1.46*  CALCIUM 8.3* 8.5* 8.5* 8.6* 8.7*  MG  --   --  1.2* 1.9 1.7  PHOS  --   --  1.8* 2.4* 1.9*   GFR: Estimated Creatinine  Clearance: 59.2 mL/min (A) (by C-G formula based on SCr of 1.46 mg/dL (H)). Liver Function Tests: Recent Labs  Lab 12/08/18 0647  AST 13*  ALT 11  ALKPHOS 54  BILITOT 0.6  PROT 5.9*  ALBUMIN 2.7*   No results for input(s): LIPASE, AMYLASE in the last 168 hours. No results for input(s): AMMONIA in the last 168 hours. Coagulation Profile: Recent Labs  Lab 12/05/18 1242  INR 1.08   Cardiac Enzymes: No results for input(s): CKTOTAL, CKMB, CKMBINDEX, TROPONINI in the last 168 hours. BNP (last 3 results) No results for input(s): PROBNP in the last 8760 hours. HbA1C: No results for input(s): HGBA1C in the last 72 hours. CBG: Recent Labs  Lab 12/09/18 0534 12/09/18 1145 12/09/18 1647 12/09/18 2359 12/10/18 0621  GLUCAP 131* 117* 113* 114* 118*   Lipid Profile: Recent Labs    12/08/18 0647  TRIG 226*   Thyroid Function Tests: No results for input(s): TSH, T4TOTAL, FREET4, T3FREE, THYROIDAB in the last 72 hours. Anemia Panel: No results for input(s): VITAMINB12, FOLATE, FERRITIN, TIBC, IRON, RETICCTPCT in the last 72 hours. Sepsis Labs: No results for input(s): PROCALCITON, LATICACIDVEN in the last 168 hours.  Recent Results (from the past 240 hour(s))  Culture, blood (  routine x 2)     Status: None   Collection Time: 12/01/18  9:33 PM  Result Value Ref Range Status   Specimen Description   Final    BLOOD PORTA CATH Performed at [facility], [address] [place], [place] [address]    Special Requests   Final    BOTTLES DRAWN AEROBIC AND ANAEROBIC Blood Culture adequate volume Performed at [facility], [address] [place], [place] [address]    Culture   Final    NO GROWTH 5 DAYS Performed at [facility], [address], [place], [place] [address]    Report Status 12/07/2018 FINAL  Final  Culture, blood (routine x 2)     Status: None   Collection Time: 12/02/18 12:00 AM  Result Value Ref Range Status    Specimen Description   Final    BLOOD RIGHT HAND Performed at [facility], [address], [place], [place] [address]    Special Requests   Final    BOTTLES DRAWN AEROBIC AND ANAEROBIC Blood Culture adequate volume Performed at [facility], [address], [place], [place] [address]    Culture   Final    NO GROWTH 5 DAYS Performed at [facility], [address] [place], [place] [address]    Report Status 12/07/2018 FINAL  Final  Urine culture     Status: None   Collection Time: 12/02/18 12:18 AM  Result Value Ref Range Status   Specimen Description URINE, CLEAN CATCH  Final   Special Requests   Final    NONE Performed at [facility], [address], [place], [place] [address]    Culture NO GROWTH  Final   Report Status 12/03/2018 FINAL  Final         Radiology Studies: No results found.      Scheduled Meds: . cholecalciferol  2,000 Units Oral Daily  . dronabinol  5 mg Oral BID AC  . fluconazole  100 mg Oral Daily  . FLUoxetine  40 mg Oral Daily  . insulin aspart  0-9 Units Subcutaneous Q6H  . mirtazapine  30 mg Oral QHS  . mometasone-formoterol  2 puff Inhalation BID  . nebivolol  20 mg Oral Daily  . pantoprazole  40 mg Oral QHS  . prazosin  1 mg Oral QHS  . PRUTECT   Topical BID  . ranolazine  500 mg Oral BID  . rosuvastatin  20 mg Oral Daily  . sodium chloride flush  10-40 mL Intracatheter Q12H  . sucralfate  1 g Oral TID WC & HS  . umeclidinium bromide  1 puff Inhalation Daily  . vitamin B-12  500 mcg Oral Daily  . zolpidem  5 mg Oral QHS   Continuous Infusions: . sodium chloride Stopped (12/07/18 2235)  . TPN ADULT (ION) 80 mL/hr at 12/10/18 0232     LOS: 8 days    Time spent: 35 min    Gala Murdoch, MD [facility]  If 7PM-7AM, please contact night-coverage [email or web address] Password [facility] 12/10/2018, 9:29 AM

## 2018-12-10 NOTE — Progress Notes (Signed)
PHARMACY - ADULT TOTAL PARENTERAL NUTRITION CONSULT NOTE   Pharmacy Consult for TPN Indication: intolerance of enteral feeding, unable to place G-tube  Patient Measurements: Height: 5\' 9"  (175.3 cm) Weight: 184 lb 8.4 oz (83.7 kg) IBW/kg (Calculated) : 70.7 TPN AdjBW (KG): 83.7 Body mass index is 27.25 kg/m. Usual Weight: 105-115 kg (20-30 kg wt loss since Oct 2019)  HPI: 64 yoM with PMH SCLC with recent chemo and pancytopenia, HTN, CAD, admitted with n/v and weakness. Found to have recurrent esophageal stenosis d/t malignancy vs radiation. Holding Plavix for possible G-tube placement, but still with low WBC and Plt following chemo so unable to place in the near future. Pharmacy to dose TPN as patient has been without adequate nutrition for several weeks  Central access: Chemo port, single lumen TPN start date: 1/16  Significant events:  1/17: EGD - severely inflamed esophagus; friable, oozing mucosa w/ sloughing of mucosa- likely radiation esophagitis 1/18 tentative plan for G tube placement early next week  1/19 direct puncture G tube per IR by Dr Anselm Pancoast - tentative for 1/20  ASSESSMENT                                                                                                           Current Nutrition: NPO/CLD  IVF: none  Today, 12/10/2018:  Glucose (CBG goal 100-150) -all < 150 acceptable  Electrolytes -  Na 146, K 3.8 after Kphos 20 mM yest. Mag 1.8 after 2 gm yesterday, phos 3.3 after 20 mM kphos ahd 1 run Kcl  Renal - AKI on admission; SCr 1.58 > 1.46> 1.39  LFTs - WNL on 1/17 except low albumin  TGs - elevated at 226 but < 400 currently (1/17)  Prealbumin - moderately low  11.5 (1/17)  NUTRITIONAL GOALS                                                                                             RD recs:  Kcal:  2500-2700 Protein:  125-135g Fluid:  2.5L/day  PLAN  Mag 2 gm   At 1800 today:  Advance TPN to goal rate of 100 ml/hr;  TPN at goal rate of 100 ml/hr provides 120 g of protein, 360 g of dextrose, and 72 g of lipids for a total of 2424 kcals meeting 97% of patient needs   change MVI to pill form - pt taking pills OK  Electrolytes in TPN: standard concentration; Cl:Ac 1:1  Continue sensitive SSI with q6 hr CBG checks, wean as able if not utilizing  TPN lab panels on Mondays & Thursdays;  F/u placement of G tube and transition to TFs  Eudelia Bunch, Pharm.D [phone] 12/10/2018 10:28 AM

## 2018-12-11 ENCOUNTER — Ambulatory Visit: Admission: RE | Admit: 2018-12-11 | Payer: Medicare Other | Source: Ambulatory Visit | Admitting: Radiation Oncology

## 2018-12-11 ENCOUNTER — Encounter (HOSPITAL_COMMUNITY): Payer: Self-pay | Admitting: Gastroenterology

## 2018-12-11 ENCOUNTER — Inpatient Hospital Stay: Payer: Medicare Other

## 2018-12-11 ENCOUNTER — Inpatient Hospital Stay (HOSPITAL_COMMUNITY): Payer: Medicare Other

## 2018-12-11 HISTORY — PX: IR GASTROSTOMY TUBE MOD SED: IMG625

## 2018-12-11 LAB — DIFFERENTIAL
Abs Immature Granulocytes: 0.25 10*3/uL — ABNORMAL HIGH (ref 0.00–0.07)
Basophils Absolute: 0 10*3/uL (ref 0.0–0.1)
Basophils Relative: 1 %
Eosinophils Absolute: 0 10*3/uL (ref 0.0–0.5)
Eosinophils Relative: 0 %
Immature Granulocytes: 6 %
Lymphocytes Relative: 9 %
Lymphs Abs: 0.4 10*3/uL — ABNORMAL LOW (ref 0.7–4.0)
Monocytes Absolute: 1.1 10*3/uL — ABNORMAL HIGH (ref 0.1–1.0)
Monocytes Relative: 24 %
NEUTROS ABS: 2.7 10*3/uL (ref 1.7–7.7)
Neutrophils Relative %: 60 %

## 2018-12-11 LAB — CBC
HCT: 28.4 % — ABNORMAL LOW (ref 39.0–52.0)
Hemoglobin: 9.1 g/dL — ABNORMAL LOW (ref 13.0–17.0)
MCH: 28.7 pg (ref 26.0–34.0)
MCHC: 32 g/dL (ref 30.0–36.0)
MCV: 89.6 fL (ref 80.0–100.0)
PLATELETS: 47 10*3/uL — AB (ref 150–400)
RBC: 3.17 MIL/uL — ABNORMAL LOW (ref 4.22–5.81)
RDW: 15.5 % (ref 11.5–15.5)
WBC: 4.5 10*3/uL (ref 4.0–10.5)
nRBC: 0 % (ref 0.0–0.2)

## 2018-12-11 LAB — BASIC METABOLIC PANEL
Anion gap: 6 (ref 5–15)
BUN: 27 mg/dL — ABNORMAL HIGH (ref 6–20)
CALCIUM: 9 mg/dL (ref 8.9–10.3)
CO2: 25 mmol/L (ref 22–32)
Chloride: 113 mmol/L — ABNORMAL HIGH (ref 98–111)
Creatinine, Ser: 1.32 mg/dL — ABNORMAL HIGH (ref 0.61–1.24)
GFR calc Af Amer: >60 mL/min (ref >60)
GFR calc non Af Amer: >60 mL/min (ref >60)
Glucose, Bld: 116 mg/dL — ABNORMAL HIGH (ref 70–99)
Potassium: 4.1 mmol/L (ref 3.5–5.1)
Sodium: 144 mmol/L (ref 135–145)

## 2018-12-11 LAB — PREALBUMIN: Prealbumin: 16.3 mg/dL — ABNORMAL LOW (ref 18–38)

## 2018-12-11 LAB — MAGNESIUM: Magnesium: 1.9 mg/dL (ref 1.7–2.4)

## 2018-12-11 LAB — GLUCOSE, CAPILLARY
Glucose-Capillary: 101 mg/dL — ABNORMAL HIGH (ref 70–99)
Glucose-Capillary: 103 mg/dL — ABNORMAL HIGH (ref 70–99)
Glucose-Capillary: 113 mg/dL — ABNORMAL HIGH (ref 70–99)
Glucose-Capillary: 94 mg/dL (ref 70–99)

## 2018-12-11 LAB — PROTIME-INR
INR: 1.04
Prothrombin Time: 13.5 seconds (ref 11.4–15.2)

## 2018-12-11 LAB — PHOSPHORUS: Phosphorus: 3.5 mg/dL (ref 2.5–4.6)

## 2018-12-11 LAB — TRIGLYCERIDES: Triglycerides: 168 mg/dL — ABNORMAL HIGH (ref <150)

## 2018-12-11 MED ORDER — MIDAZOLAM HCL 2 MG/2ML IJ SOLN
INTRAMUSCULAR | Status: AC
  Filled 2018-12-11: qty 4

## 2018-12-11 MED ORDER — IOPAMIDOL (ISOVUE-300) INJECTION 61%
INTRAVENOUS | Status: AC
  Filled 2018-12-11: qty 50

## 2018-12-11 MED ORDER — DEXTROSE 10 % IV SOLN
INTRAVENOUS | Status: DC
  Administered 2018-12-11 – 2018-12-12 (×3): via INTRAVENOUS
  Filled 2018-12-11: qty 1000

## 2018-12-11 MED ORDER — LIDOCAINE HCL (PF) 1 % IJ SOLN
INTRAMUSCULAR | Status: AC | PRN
  Administered 2018-12-11 (×2): 10 mL

## 2018-12-11 MED ORDER — FENTANYL CITRATE (PF) 100 MCG/2ML IJ SOLN
INTRAMUSCULAR | Status: AC
  Filled 2018-12-11: qty 2

## 2018-12-11 MED ORDER — MIDAZOLAM HCL 2 MG/2ML IJ SOLN
INTRAMUSCULAR | Status: AC
  Filled 2018-12-11: qty 2

## 2018-12-11 MED ORDER — GLUCAGON HCL RDNA (DIAGNOSTIC) 1 MG IJ SOLR
INTRAMUSCULAR | Status: AC
  Filled 2018-12-11: qty 1

## 2018-12-11 MED ORDER — IOPAMIDOL (ISOVUE-300) INJECTION 61%: 50.0000 mL | Freq: Once | INTRAVENOUS | Status: DC | PRN

## 2018-12-11 MED ORDER — CEFAZOLIN SODIUM-DEXTROSE 2-4 GM/100ML-% IV SOLN
2.0000 g | Freq: Once | INTRAVENOUS | Status: AC
  Administered 2018-12-11: 2 g via INTRAVENOUS

## 2018-12-11 MED ORDER — CEFAZOLIN SODIUM-DEXTROSE 2-4 GM/100ML-% IV SOLN
INTRAVENOUS | Status: AC
  Filled 2018-12-11: qty 100

## 2018-12-11 MED ORDER — MIDAZOLAM HCL 2 MG/2ML IJ SOLN
INTRAMUSCULAR | Status: AC | PRN
  Administered 2018-12-11 (×2): 1 mg via INTRAVENOUS
  Administered 2018-12-11 (×2): 2 mg via INTRAVENOUS

## 2018-12-11 MED ORDER — LIDOCAINE HCL 1 % IJ SOLN
INTRAMUSCULAR | Status: AC
  Filled 2018-12-11: qty 20

## 2018-12-11 MED ORDER — GLUCAGON HCL (RDNA) 1 MG IJ SOLR
INTRAMUSCULAR | Status: AC | PRN
  Administered 2018-12-11: 1 mg via INTRAVENOUS

## 2018-12-11 MED ORDER — FENTANYL CITRATE (PF) 100 MCG/2ML IJ SOLN
INTRAMUSCULAR | Status: AC | PRN
  Administered 2018-12-11 (×2): 50 ug via INTRAVENOUS

## 2018-12-11 NOTE — Progress Notes (Signed)
Nutrition Follow-up  DOCUMENTATION CODES:   Non-severe (moderate) malnutrition in context of chronic illness  INTERVENTION:   TPN per Pharmacy  TF recommendations: Initiate Osmolite 1.5 @ 20 ml/hr and advance by 10 ml every 12 hours to goal rate of 70 ml/hr +30 ml Prostat BID which provides 2720 kcal, 135g protein and 1280 ml H2O.  Once nutrition support initiated, recommend monitor magnesium, potassium, and phosphorus daily for at least 3 days, MD to replete as needed, as pt is at risk for refeeding syndrome given poor PO intakes and malnutrition status.  NUTRITION DIAGNOSIS:   Moderate Malnutrition related to chronic illness, cancer and cancer related treatments as evidenced by percent weight loss, energy intake < or equal to 75% for > or equal to 1 month, mild muscle depletion.  Ongoing.  GOAL:   Patient will meet greater than or equal to 90% of their needs  Meeting with TPN.  MONITOR:   Labs, Weight trends, I & O's(TPN)  ASSESSMENT:   53 y.o.malewith medical history significant ofSCLC chemo x8 days ago, HTN, CAD. Patient presents to the ED with c/o multiple episodes of N/V for past couple of days. Associated generalized weakness. Generalized abd pain. Poor PO intake. 1/17: s/p EGD. Findings: severe esophagitis, esophageal structure  Patient currently NPO for pending tube placement via IR. TF recommendations provided above for when tube is ready to use. Pt continues to be at refeeding risk and recommend slow advancement of TF.   Currently pt is receiving TPN at goal of 100 ml/hr (providing 2424 kcal and 120g protein).  No new weight has been measured. Recommend new measurement if possible.   Medications: Vitamin D tablet daily, Marinol capsule BID, Remeron tablet daily, Multivitamin with minerals daily, Vitamin B-12 tablet daily, IV Zofran PRN, IV Phenergan PRN Labs reviewed: TG: 168 mg/dL  Diet Order:   Diet Order            Diet NPO time specified Except  for: Sips with Meds  Diet effective midnight              EDUCATION NEEDS:   Education needs have been addressed  Skin:  Skin Assessment: Reviewed RN Assessment  Last BM:  1/9  Height:   Ht Readings from Last 1 Encounters:  12/02/18 5\' 9"  (1.753 m)    Weight:   Wt Readings from Last 1 Encounters:  12/02/18 83.7 kg    Ideal Body Weight:  72.7 kg  BMI:  Body mass index is 27.25 kg/m.  Estimated Nutritional Needs:   Kcal:  2500-2700  Protein:  125-135g  Fluid:  2.5L/day  Clayton Bibles, MS, RD, LDN [place] [facility] Pager: [phone] After Hours Pager: [phone]

## 2018-12-11 NOTE — Procedures (Signed)
18 Fr Balloon Retention G tube Tip is in stomach EBL 0 Comp 0

## 2018-12-11 NOTE — Progress Notes (Signed)
 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 intake, and found to have influenza B and possible right lower lobe pneumonia.  EGD performed on 12/08/2018 found evidence of severe radiation esophagitis and possible stricture and patient is currently pending PEG tube placement.  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)   Odynophagia   N&V (nausea and vomiting)   Antineoplastic chemotherapy induced pancytopenia (HCC)   Esophageal stricture   Malnutrition of moderate degree   CKD (chronic kidney disease) stage 3, GFR 30-59 ml/min (HCC)   #) Failure to thrive/dysphagia/nausea/vomiting/odynophagia:  EGD on 1/17 identifying severe esophagitis and esophageal stricture, likely due to radiation esophagitis, which will take weeks to heal --pending placement of a G-tube (need a direct puncture G tube per IR), timing of G tube placement per  IR -Clear liquid diet by mouth as tolerated -Continue TPN for now, pharmacy consult   #) Influenza B/right lower lobe pneumonia: Unclear how much contributing to his current presentation is influenza B and this possible pneumonia.  The pneumonia is almost certainly postobstructive. -iv Cefepime completed on 12/09/18 -Tamiflu completed on  12/08/18 -Blood cultures from 12/02/2018 no growth to date  #) Pancytopenia: Secondary to chemotherapy -Improving, monitoring daily CBC -s/p 2 units of platelets on 12/08/18  #) Hypertension/hyperlipidemia: Currently patient has difficulty taking pills by mouth; BP overall controlled -Hold amlodipine 10 mg daily - Hold isosorbide mononitrate 120 mg daily -Hold nebivolol 20 mg daily - Continue rosuvastatin 20 mg daily - Hold hydralazine 25 mg twice daily -Continue IV metoprolol 2.5 mg every 8 hours  #) Coronary artery disease status post stent: Patient apparently is no longer a candidate for any nonsurgical interventions. -Continue aspirin 81 mg daily -Hold clopidogrel 75 mg daily -Continue beta-blocker, long-acting nitrate - Continue statin -Continue ranolazine thousand milligrams twice daily  #) COPD: -Continue L AMA/ICS/LABA -Continue PRN short-acting bronchodilators  #) Small cell lung cancer: Chemotherapy was on 11/23/2018 - consult oncology, Dr. Shirline Frees  #) BPH: -Continue prazosin  #) Pain/psych: -Continue dronabinol -Continue fluoxetine 40 mg daily -Continue prazosin 1 mg nightly  Fluids: Tolerating p.o. liquid, TPN Electrolytes: Monitor and supplement Nutrition: Per above  Prophylaxis: Thrombocytopenic. SCD  Disposition: Pending placement of G-tube and initiation of tube feeds  Full code   Consultants:   Interventional radiology  Oncology, Dr. Shirline Frees  GI  Procedures:   EGD 12/08/18  Antimicrobials:   Cefepime completed on 12/09/2018  Oseltamivir completed on 12/08/2018   Subjective: Today patient reports he is doing fairly well.  He is eager to have his G-tube placed today.  He denies any nausea, vomiting, diarrhea, cough, congestion, rhinorrhea.  Objective: Vitals:   12/10/18 2026 12/10/18 2108 12/11/18 0633 12/11/18 1103  BP:  (!) 157/99 126/82   Pulse:  71 71 73  Resp:  18 16 17   Temp:  99.4 F (37.4 C) 98.3 F (36.8 C)    TempSrc:  Oral Oral   SpO2: 94% 96% 99% 99%  Weight:      Height:        Intake/Output Summary (Last 24 hours) at 12/11/2018 1153 Last data filed at 12/11/2018 9629 Gross per 24 hour  Intake 2815.15 ml  Output 575 ml  Net 2240.15 ml   Filed Weights   12/01/18 1836 12/02/18 0012  Weight: 90 kg 83.7 kg    Examination:  General exam: Appears calm and comfortable  Respiratory system: Clear to auscultation. Respiratory effort normal. No wheezing Cardiovascular system: Regular rate and rhythm, no murmurs Gastrointestinal system: Soft, nondistended, no rebound or guarding, plus bowel sounds Central nervous system: Alert and oriented.  Grossly intact, moving all extremities Extremities: No lower extremity edema. Skin: Port site is clean dry and intact Psychiatry: Judgement and insight appear normal. Mood & affect appropriate.     Data Reviewed: I have personally reviewed following labs and imaging studies  CBC: Recent Labs  Lab 12/06/18 0521 12/07/18 0622 12/08/18 0647 12/09/18 1002 12/10/18 0935 12/11/18 0443  WBC 0.4* 0.9* 2.5* 5.2 4.5 4.5  NEUTROABS 0.1* 0.2* 1.2* 3.3  --  2.7  HGB 9.3* 9.7* 9.2* 9.1* 8.3* 9.1*  HCT 28.3* 29.9* 28.0* 28.3* 26.2* 28.4*  MCV 85.5 88.2 86.7 89.6 90.3 89.6  PLT 30* 32* 33* 44* 44* 47*   Basic Metabolic Panel: Recent Labs  Lab 12/07/18 0622 12/08/18 0647 12/09/18 1002 12/10/18 0935 12/11/18 0443  NA 140 141 143 146* 144  K 3.5 3.4* 3.7 3.8 4.1  CL 107 107 109 114* 113*  CO2 24 25 25 26 25   GLUCOSE 71 94 132* 127* 116*  BUN 11 14 15 20  27*  CREATININE 1.49* 1.58* 1.46* 1.39* 1.32*  CALCIUM 8.5* 8.6* 8.7* 8.7* 9.0  MG 1.2* 1.9 1.7 1.8 1.9  PHOS 1.8* 2.4* 1.9* 3.3 3.5   GFR: Estimated Creatinine Clearance: 65.5 mL/min (A) (by C-G formula based on SCr of 1.32 mg/dL (H)). Liver Function Tests: Recent Labs  Lab 12/08/18 0647  AST 13*  ALT 11  ALKPHOS 54  BILITOT 0.6  PROT 5.9*  ALBUMIN 2.7*   No results for  input(s): LIPASE, AMYLASE in the last 168 hours. No results for input(s): AMMONIA in the last 168 hours. Coagulation Profile: Recent Labs  Lab 12/05/18 1242 12/11/18 0443  INR 1.08 1.04   Cardiac Enzymes: No results for input(s): CKTOTAL, CKMB, CKMBINDEX, TROPONINI in the last 168 hours. BNP (last 3 results) No results for input(s): PROBNP in the last 8760 hours. HbA1C: No results for input(s): HGBA1C in the last 72 hours. CBG: Recent Labs  Lab 12/10/18 0621 12/10/18 1136 12/10/18 1729 12/10/18 2357 12/11/18 0631  GLUCAP 118* 106* 108* 107* 101*   Lipid Profile: Recent Labs    12/11/18 0443  TRIG 168*   Thyroid Function Tests: No results for input(s): TSH, T4TOTAL, FREET4, T3FREE, THYROIDAB in the last 72 hours. Anemia Panel: No results for input(s): VITAMINB12, FOLATE, FERRITIN, TIBC, IRON, RETICCTPCT in the last 72 hours. Sepsis Labs: No results for input(s): PROCALCITON, LATICACIDVEN in the last 168 hours.  Recent Results (from the past 240 hour(s))  Culture, blood (routine x 2)     Status: None   Collection Time: 12/01/18  9:33 PM  Result Value Ref Range Status   Specimen Description   Final    BLOOD PORTA CATH Performed at [facility], [address] [place],  [place] [address]    Special Requests   Final    BOTTLES DRAWN AEROBIC AND ANAEROBIC Blood Culture adequate volume Performed at [facility], [address] [place], [place] [address]    Culture   Final    NO GROWTH 5 DAYS Performed at [facility], [address], [place], [place] [address]    Report Status 12/07/2018 FINAL  Final  Culture, blood (routine x 2)     Status: None   Collection Time: 12/02/18 12:00 AM  Result Value Ref Range Status   Specimen Description   Final    BLOOD RIGHT HAND Performed at [facility], [address], [place], [place] [address]    Special Requests   Final    BOTTLES DRAWN AEROBIC AND ANAEROBIC Blood Culture  adequate volume Performed at [facility], [address] [place], [place] [address]    Culture   Final    NO GROWTH 5 DAYS Performed at [facility], [address], [place], [place] [address]    Report Status 12/07/2018 FINAL  Final  Urine culture     Status: None   Collection Time: 12/02/18 12:18 AM  Result Value Ref Range Status   Specimen Description URINE, CLEAN CATCH  Final   Special Requests   Final    NONE Performed at [facility], [address], [place], [place] [address]    Culture NO GROWTH  Final   Report Status 12/03/2018 FINAL  Final         Radiology Studies: No results found.      Scheduled Meds: . cholecalciferol  2,000 Units Oral Daily  . dronabinol  5 mg Oral BID AC  . fluconazole  100 mg Oral Daily  . FLUoxetine  40 mg Oral Daily  . insulin aspart  0-9 Units Subcutaneous Q6H  . mirtazapine  30 mg Oral QHS  . mometasone-formoterol  2 puff Inhalation BID  . multivitamin with minerals  1 tablet Oral Daily  . nebivolol  20 mg Oral Daily  . pantoprazole  40 mg Oral QHS  . prazosin  1 mg Oral QHS  . PRUTECT   Topical BID  . ranolazine  500 mg Oral BID  . rosuvastatin  20 mg Oral Daily  . sodium chloride flush  10-40 mL Intracatheter Q12H  . sucralfate  1 g Oral TID WC & HS  . umeclidinium bromide  1 puff Inhalation Daily  . vitamin B-12  500 mcg Oral Daily  . zolpidem  5 mg Oral QHS   Continuous Infusions: . sodium chloride Stopped (12/07/18 2235)  . TPN ADULT (ION) 100 mL/hr at 12/11/18 0344     LOS: 9 days    Time spent: 35 min    Delaine Lame, MD [facility]  If 7PM-7AM, please contact night-coverage [email or web address] Password [facility] 12/11/2018, 11:53 AM

## 2018-12-11 NOTE — Progress Notes (Signed)
PHARMACY - TPN consult note  Patient s/p G-tube placement today. Pharmacist earlier today advised by MD that bolus feedings would begin after G-tube placement; therefore no TPN orders were placed.  RN called pharmacy and reported that plans for using G-tube will not begin until 1/21.  As TPN is manufactured offsite, pharmacy is unable to provide TPN to be hung this evening.  Plan: Current TPN infusing at 150ml/hr.  RN will decrease rate to 50 ml/hr and continue at that rate until bag is empty. Dextrose 10% @ 1103ml/hr ordered to infuse in the absence of TPN and will be d/c'ed once bolus tube feedings begin.  Leone Haven, PharmD

## 2018-12-11 NOTE — Care Management Important Message (Signed)
Important Message  Patient Details  Name: Andrew West MRN: [id number] Date of Birth: Feb 01, 1966   Medicare Important Message Given:  Yes    [name] 12/11/2018, 11:26 AMImportant Message  Patient Details  Name: Andrew West MRN: [id number] Date of Birth: [birth year]   Medicare Important Message Given:  Yes    [name] 12/11/2018, 11:26 AM

## 2018-12-12 LAB — BASIC METABOLIC PANEL
Anion gap: 8 (ref 5–15)
BUN: 31 mg/dL — AB (ref 6–20)
CO2: 24 mmol/L (ref 22–32)
Calcium: 8.6 mg/dL — ABNORMAL LOW (ref 8.9–10.3)
Chloride: 106 mmol/L (ref 98–111)
Creatinine, Ser: 1.45 mg/dL — ABNORMAL HIGH (ref 0.61–1.24)
GFR calc Af Amer: >60 mL/min (ref >60)
GFR, EST NON AFRICAN AMERICAN: 55 mL/min — AB (ref >60)
Glucose, Bld: 122 mg/dL — ABNORMAL HIGH (ref 70–99)
Potassium: 3.8 mmol/L (ref 3.5–5.1)
SODIUM: 138 mmol/L (ref 135–145)

## 2018-12-12 LAB — CBC
HCT: 27.7 % — ABNORMAL LOW (ref 39.0–52.0)
Hemoglobin: 8.8 g/dL — ABNORMAL LOW (ref 13.0–17.0)
MCH: 28.6 pg (ref 26.0–34.0)
MCHC: 31.8 g/dL (ref 30.0–36.0)
MCV: 89.9 fL (ref 80.0–100.0)
Platelets: 51 10*3/uL — ABNORMAL LOW (ref 150–400)
RBC: 3.08 MIL/uL — ABNORMAL LOW (ref 4.22–5.81)
RDW: 15.8 % — ABNORMAL HIGH (ref 11.5–15.5)
WBC: 5.1 10*3/uL (ref 4.0–10.5)
nRBC: 0 % (ref 0.0–0.2)

## 2018-12-12 LAB — GLUCOSE, CAPILLARY
Glucose-Capillary: 119 mg/dL — ABNORMAL HIGH (ref 70–99)
Glucose-Capillary: 102 mg/dL — ABNORMAL HIGH (ref 70–99)
Glucose-Capillary: 68 mg/dL — ABNORMAL LOW (ref 70–99)
Glucose-Capillary: 115 mg/dL — ABNORMAL HIGH (ref 70–99)

## 2018-12-12 MED ORDER — CEFAZOLIN SODIUM-DEXTROSE 2-4 GM/100ML-% IV SOLN: 2.0000 g | Freq: Once | INTRAVENOUS | Status: AC

## 2018-12-12 MED ORDER — JEVITY 1.2 CAL PO LIQD: 1000.0000 mL | ORAL | Status: DC

## 2018-12-12 MED ORDER — OSMOLITE 1.5 CAL PO LIQD
237.0000 mL | Freq: Three times a day (TID) | ORAL | Status: DC
  Administered 2018-12-12 (×2): 237 mL
  Filled 2018-12-12 (×5): qty 237

## 2018-12-12 MED ORDER — HYDROMORPHONE HCL 2 MG/ML IJ SOLN
2.0000 mg | INTRAMUSCULAR | Status: DC | PRN
  Administered 2018-12-12 – 2018-12-13 (×3): 2 mg via INTRAVENOUS
  Filled 2018-12-12 (×4): qty 1

## 2018-12-12 NOTE — Care Management Note (Signed)
Case Management Note  Patient Details  Name: Andrew West MRN: [id number] Date of Birth: 06-05-1966                  Action/Plan: This CM spoke with pt and wife [name] at bedside. Pt to dc to wife's home at dc ([address] [place] [phone]). Pt will need home health RN for assistance with tube feeds. Pt will also need tube feeding pump and tube feeds. He is requesting a cane and 3in1 for home. Will need MD orders for all above items at dc. Pt offered choice for home health service and [facility] chosen. [facility] rep alerted of referral and DME needs  Expected Discharge Date:  (unknown)               Expected Discharge Plan:     In-House Referral:     Discharge planning Services     Post Acute Care Choice:    Choice offered to:     DME Arranged:   Tube Feeds, Tube feeding pump, cane and 3in1. DME Agency:   [facility]  HH Arranged:   [place]:   [facility]  Status of Service:     If discussed at [place] of Stay Meetings, dates discussed:    Additional CommentsLynnell Catalan, RN 12/12/2018, 2:03 PM  [phone]

## 2018-12-12 NOTE — Progress Notes (Signed)
Nutrition Follow-up  DOCUMENTATION CODES:   Non-severe (moderate) malnutrition in context of chronic illness  INTERVENTION:   Initiate bolus + night feed regimen via PEG tube: 1/21: Osmolite 1.5, 240 ml TID via PEG Infuse Osmolite 1.5 @ 40 ml/hr x 12 hours tonight. Free water flushes of 120 ml before and after each bolus feed and infusion. (Total: 960 ml).  Advance by 10 ml every night until night feeds infuse at 70 ml/hr x 12 hours. Once tolerating will add additional bolus feed to make QID.  Goal: Osmolite 1.5 240 ml QID via PEG. Infuse Osmolite 1.5 @ 70 ml/hr x 12 hours at night. Free water flushes of 120 ml before and after each bolus and infusion (total: 1266ml) Provides 2680 kcal, 112g protein and 2564 ml H2O.  NUTRITION DIAGNOSIS:   Moderate Malnutrition related to chronic illness, cancer and cancer related treatments as evidenced by percent weight loss, energy intake < or equal to 75% for > or equal to 1 month, mild muscle depletion.  Ongoing.  GOAL:   Patient will meet greater than or equal to 90% of their needs  Was meeting with TPN.  MONITOR:   Labs, Weight trends, I & O's(TPN), TF  ASSESSMENT:   54 y.o.malewith medical history significant ofSCLC chemo x8 days ago, HTN, CAD. Patient presents to the ED with c/o multiple episodes of N/V for past couple of days. Associated generalized weakness. Generalized abd pain. Poor PO intake.   1/20: s/p G-tube placed via IR  Received page from MD regarding TF initiation. Per MD, pt left AMA last admission and MD concerned that pt may leave as he is eager to today. Bolus feed + night feed regimen decided upon. RD will leave TF  and free water flush recommendations above.  [facility] follows patient and can assist patient with TF going further outpatient. Will encourage pt to set up follow-up with RD.  No weight has been measured since 1/11.  Medications: Remeron tablet daily, Multivitamin with minerals daily,  Vitamin B-12 tablet daily, D10 infusion at 100 ml/hr, IV Zofran PRN  Labs reviewed: CBGs: 103-119  Diet Order:   Diet Order            Diet NPO time specified Except for: Ice Chips  Diet effective now              EDUCATION NEEDS:   Education needs have been addressed  Skin:  Skin Assessment: Reviewed RN Assessment  Last BM:  1/9  Height:   Ht Readings from Last 1 Encounters:  12/02/18 5\' 9"  (1.753 m)    Weight:   Wt Readings from Last 1 Encounters:  12/02/18 83.7 kg    Ideal Body Weight:  72.7 kg  BMI:  Body mass index is 27.25 kg/m.  Estimated Nutritional Needs:   Kcal:  2500-2700  Protein:  125-135g  Fluid:  2.5L/day  Clayton Bibles, MS, RD, LDN [place] [facility] Pager: [phone] After Hours Pager: [phone]

## 2018-12-12 NOTE — Progress Notes (Signed)
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 intake, and found to have influenza B and possible right lower lobe pneumonia.  EGD performed on 12/08/2018 found evidence of severe radiation esophagitis and possible stricture patient is status post PEG tube placement on 12/11/2018 and is working up on his feeds.  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)   Odynophagia   N&V (nausea and vomiting)   Antineoplastic chemotherapy induced pancytopenia (HCC)   Esophageal stricture   Malnutrition of moderate degree   CKD (chronic kidney disease) stage 3, GFR 30-59 ml/min (HCC)   #) Failure to thrive/dysphagia/nausea/vomiting/odynophagia status post PEG tube placement on 12/11/2018:  EGD on 1/17 identifying severe esophagitis and esophageal stricture, likely due to radiation esophagitis, which will take weeks to heal -We will start Osmolite 1.5 kcal with bolus feeds 3 times daily and overnight feeds continuously and advance as an outpatient -Clear liquid diet by mouth as tolerated -Discontinue TPN   #) Influenza B/right lower lobe pneumonia: Unclear how much contributing to his current presentation is influenza B and this possible pneumonia.  The pneumonia is almost certainly  postobstructive. -iv Cefepime completed on 12/09/18 -Tamiflu completed on 12/08/18 -Blood cultures from 12/02/2018 no growth to date  #) Pancytopenia: Secondary to chemotherapy -Improving, monitoring daily CBC -s/p 2 units of platelets on 12/08/18  #) Hypertension/hyperlipidemia: Currently patient has difficulty taking pills by mouth; BP overall controlled -Hold amlodipine 10 mg daily - Hold isosorbide mononitrate 120 mg daily -Hold nebivolol 20 mg daily - Continue rosuvastatin 20 mg daily - Hold hydralazine 25 mg twice daily -Continue IV metoprolol 2.5 mg every 8 hours  #) Coronary artery disease status post stent: Patient apparently is no longer a candidate for any nonsurgical interventions. -Continue aspirin 81 mg daily -Hold clopidogrel 75 mg daily -Continue beta-blocker, long-acting nitrate - Continue statin -Continue ranolazine thousand milligrams twice daily  #) COPD: -Continue L AMA/ICS/LABA -Continue PRN short-acting bronchodilators  #) Small cell lung cancer: Chemotherapy was on 11/23/2018 - consult oncology, Dr. Shirline Frees  #) BPH: -Continue prazosin  #) Pain/psych: -Continue dronabinol -Continue fluoxetine 40 mg daily -Continue prazosin 1 mg nightly  Fluids: Tolerating p.o. liquid, TPN Electrolytes: Monitor and supplement Nutrition: Per above  Prophylaxis: Thrombocytopenic. SCD  Disposition: Pending working up of G-tube feeds  Full code   Consultants:   Interventional radiology  Oncology, Dr. Shirline Frees  GI  Procedures:   EGD 12/08/18  PEG tube placement 12/11/2018  Antimicrobials:   Cefepime completed on 12/09/2018  Oseltamivir completed on 12/08/2018   Subjective: Today patient reports he is doing fairly well.  He reports some soreness around the G-tube site but denies any nausea, vomiting, diarrhea.  He is eager to be discharged.  Objective: Vitals:   12/11/18 1929 12/11/18 2357 12/12/18 0530 12/12/18 0810  BP:  109/81 (!) 123/97  Pulse:  87 93   Resp:  18 18   Temp:  98.3 F (36.8 C) 99.2 F (37.3 C)   TempSrc:  Oral Oral   SpO2: 96% 96% 98% 95%  Weight:      Height:        Intake/Output Summary (Last 24 hours) at 12/12/2018 1008 Last data filed at 12/12/2018 0600 Gross per 24 hour  Intake 2488.42 ml  Output 200 ml  Net 2288.42 ml   Filed Weights   12/01/18 1836 12/02/18 0012  Weight: 90 kg 83.7 kg    Examination:  General exam: Appears calm and comfortable  Respiratory system: Clear to auscultation. Respiratory effort normal. No wheezing Cardiovascular system: Regular rate and rhythm, no murmurs Gastrointestinal system: Soft, nondistended, no rebound or guarding, plus bowel sounds Central nervous system: Alert and oriented.  Grossly intact, moving all extremities Extremities: No lower extremity edema. Skin: Port site is clean dry and intact, G-tube site is clean dry and intact Psychiatry: Judgement and insight appear normal. Mood & affect appropriate.     Data Reviewed: I have personally reviewed following labs and imaging studies  CBC: Recent Labs  Lab 12/06/18 0521 12/07/18 0622 12/08/18 0647 12/09/18 1002 12/10/18 0935 12/11/18 0443 12/12/18 0626  WBC 0.4* 0.9* 2.5* 5.2 4.5 4.5 5.1  NEUTROABS 0.1* 0.2* 1.2* 3.3  --  2.7  --   HGB 9.3* 9.7* 9.2* 9.1* 8.3* 9.1* 8.8*  HCT 28.3* 29.9* 28.0* 28.3* 26.2* 28.4* 27.7*  MCV 85.5 88.2 86.7 89.6 90.3 89.6 89.9  PLT 30* 32* 33* 44* 44* 47* 51*   Basic Metabolic Panel: Recent Labs  Lab 12/07/18 0622 12/08/18 0647 12/09/18 1002 12/10/18 0935 12/11/18 0443 12/12/18 0626  NA 140 141 143 146* 144 138  K 3.5 3.4* 3.7 3.8 4.1 3.8  CL 107 107 109 114* 113* 106  CO2 24 25 25 26 25 24   GLUCOSE 71 94 132* 127* 116* 122*  BUN 11 14 15 20  27* 31*  CREATININE 1.49* 1.58* 1.46* 1.39* 1.32* 1.45*  CALCIUM 8.5* 8.6* 8.7* 8.7* 9.0 8.6*  MG 1.2* 1.9 1.7 1.8 1.9  --   PHOS 1.8* 2.4* 1.9* 3.3 3.5  --    GFR: Estimated Creatinine Clearance:  59.6 mL/min (A) (by C-G formula based on SCr of 1.45 mg/dL (H)). Liver Function Tests: Recent Labs  Lab 12/08/18 0647  AST 13*  ALT 11  ALKPHOS 54  BILITOT 0.6  PROT 5.9*  ALBUMIN 2.7*   No results for input(s): LIPASE, AMYLASE in the last 168 hours. No results for input(s): AMMONIA in the last 168 hours. Coagulation Profile: Recent Labs  Lab 12/05/18 1242 12/11/18 0443  INR 1.08 1.04   Cardiac Enzymes: No results for input(s): CKTOTAL, CKMB, CKMBINDEX, TROPONINI in the last 168 hours. BNP (last 3 results) No results for input(s): PROBNP in the last 8760 hours. HbA1C: No results for input(s): HGBA1C in the last 72 hours. CBG: Recent Labs  Lab 12/11/18 0631 12/11/18 1203 12/11/18 1735 12/11/18 2351 12/12/18 0605  GLUCAP 101* 113* 94 103* 119*   Lipid Profile: Recent Labs    12/11/18 0443  TRIG 168*   Thyroid Function Tests: No results for input(s): TSH, T4TOTAL, FREET4, T3FREE, THYROIDAB in the last 72 hours. Anemia Panel: No results for input(s): VITAMINB12, FOLATE, FERRITIN, TIBC, IRON, RETICCTPCT in the last 72 hours. Sepsis Labs: No results for input(s): PROCALCITON, LATICACIDVEN in the last 168 hours.  No results found for this or any previous visit (from the  past 240 hour(s)).       Radiology Studies: Ir Gastrostomy Tube Mod Sed  Result Date: 12/11/2018 INDICATION: Esophagitis EXAM: PERC PLACEMENT GASTROSTOMY MEDICATIONS: Ancef 2 g; Antibiotics were administered within 1 hour of the procedure. Glucagon 1 mg IV ANESTHESIA/SEDATION: Versed 6 mg IV; Fentanyl 100 mcg IV Moderate Sedation Time:  24 minutes The patient was continuously monitored during the procedure by the interventional radiology nurse under my direct supervision. CONTRAST:  No iodinated contrast was administered FLUOROSCOPY TIME:  Fluoroscopy Time: 3 minutes 24 seconds (81 mGy). COMPLICATIONS: None immediate. PROCEDURE: The procedure, risks, benefits, and alternatives were explained to the  patient. Questions regarding the procedure were encouraged and answered. The patient understands and consents to the procedure. The epigastrium was prepped with Betadine in a sterile fashion, and a sterile drape was applied covering the operative field. A sterile gown and sterile gloves were used for the procedure. A 5-French orogastric tube is placed under fluoroscopic guidance. Scout imaging of the abdomen confirms barium within the transverse colon. The stomach was distended with gas. Under left anterior oblique fluoroscopic guidance, an 18 gauge needle was advanced into the lumen of the stomach via anterior wall puncture and a T tack was deployed. A total of 3 T tacks were deployed in a triangular configuration. A 19 gauge needle was advanced between the T tacks and into the lumen of the stomach then removed over an Amplatz wire. The tract was dilated to 68 [place]. An 15 French peel-away sheath was inserted. An 21 French gastrostomy tube was then advanced over the wire and through the peel-away sheath into the lumen of the stomach. The balloon was insufflated with 7 cc saline and secured in place. 60 cc of gas was injected into the gastrostomy tube. FINDINGS: Images document placement of a 50 French balloon retention gastrostomy tube into the lumen of the stomach. Gas injected into the gastrostomy tube distended the stomach compatible with proper positioning. IMPRESSION: Successful 18 French balloon retention gastrostomy tube placement. The tip is in the stomach. Electronically Signed   By: Jolaine Click M.D.   On: 12/11/2018 17:07        Scheduled Meds: . cholecalciferol  2,000 Units Oral Daily  . dronabinol  5 mg Oral BID AC  . fluconazole  100 mg Oral Daily  . FLUoxetine  40 mg Oral Daily  . insulin aspart  0-9 Units Subcutaneous Q6H  . mirtazapine  30 mg Oral QHS  . mometasone-formoterol  2 puff Inhalation BID  . multivitamin with minerals  1 tablet Oral Daily  . nebivolol  20 mg Oral Daily    . pantoprazole  40 mg Oral QHS  . prazosin  1 mg Oral QHS  . PRUTECT   Topical BID  . ranolazine  500 mg Oral BID  . rosuvastatin  20 mg Oral Daily  . sodium chloride flush  10-40 mL Intracatheter Q12H  . sucralfate  1 g Oral TID WC & HS  . umeclidinium bromide  1 puff Inhalation Daily  . vitamin B-12  500 mcg Oral Daily  . zolpidem  5 mg Oral QHS   Continuous Infusions: . sodium chloride 10 mL/hr at 12/11/18 1654  . dextrose 100 mL/hr at 12/12/18 0940     LOS: 10 days    Time spent: 35 min    Delaine Lame, MD [facility]  If 7PM-7AM, please contact night-coverage [email or web address] Password [email or web address] 12/12/2018, 10:08 AM

## 2018-12-12 NOTE — Progress Notes (Signed)
Referring Physician(s): Kennedy Bucker  Supervising Physician: Irish Lack  Patient Status:  [facility] - In-pt  Chief Complaint: Lung cancer , dysphagia, radiation esophagitis   Subjective: Pt doing ok this am; has some soreness at G tube as expected; asking when he can go home   Allergies: Iohexol  Medications: Prior to Admission medications   Medication Sig Start Date End Date Taking? Authorizing Provider  albuterol (PROAIR HFA) 108 (90 Base) MCG/ACT inhaler Inhale 1 puff into the lungs every 6 (six) hours as needed for wheezing or shortness of breath. 08/29/18  Yes [name], MD  amLODipine (NORVASC) 10 MG tablet Take 1 tablet (10 mg total) by mouth daily. 04/20/18  Yes [name], MD  aspirin 81 MG tablet Take 1 tablet (81 mg total) daily by mouth. 10/10/17  Yes Newlin, Enobong, MD  Cholecalciferol (VITAMIN D) 2000 units tablet Take 1 tablet (2,000 Units total) by mouth daily. 08/04/18  Yes Marcello Fennel, MD  FLUoxetine (PROZAC) 40 MG capsule Take 1 capsule (40 mg total) by mouth daily. 04/20/18  Yes Newlin, Odette Horns, MD  Fluticasone-Salmeterol (ADVAIR) 100-50 MCG/DOSE AEPB Inhale 1 puff into the lungs 2 (two) times daily. 07/25/18  Yes [name], MD  mirtazapine (REMERON) 30 MG tablet Take 1 tablet (30 mg total) by mouth at bedtime. 08/31/18  Yes Si Gaul, MD  nicotine (NICODERM CQ) 21 mg/24hr patch Place 1 patch (21 mg total) onto the skin daily. 08/31/18  Yes Si Gaul, MD  pantoprazole (PROTONIX) 40 MG tablet Take 1 tablet (40 mg total) by mouth daily. 10/11/18  Yes Glade Lloyd, MD  potassium chloride 20 MEQ TBCR Take 10 mEq by mouth daily. 11/13/18  Yes Si Gaul, MD  prazosin (MINIPRESS) 1 MG capsule Take 1 capsule (1 mg total) by mouth at bedtime. For nightmares 07/25/18  Yes [name], MD  ranolazine (RANEXA) 1000 MG SR tablet Take 1 tablet (1,000 mg total) by mouth 2 (two) times daily. 05/24/17  Yes Hilty, Lisette Abu, MD    rosuvastatin (CRESTOR) 20 MG tablet Take 1 tablet (20 mg total) by mouth daily. 07/26/18  Yes [name], MD  sucralfate (CARAFATE) 1 g tablet Take 1 tablet by mouth 4 (four) times daily -  before meals and at bedtime. 09/23/18  Yes [provider]  tiotropium (SPIRIVA HANDIHALER) 18 MCG inhalation capsule Place 1 capsule (18 mcg total) into inhaler and inhale daily. 04/20/18  Yes [name], MD  vitamin B-12 (CYANOCOBALAMIN) 500 MCG tablet Take 1 tablet (500 mcg total) by mouth daily. 06/30/18  Yes Marcello Fennel, MD  zolpidem (AMBIEN) 5 MG tablet Take 1 tablet (5 mg total) by mouth at bedtime as needed for sleep. Patient taking differently: Take 5 mg by mouth at bedtime.  09/14/18  Yes [name], MD  baclofen (LIORESAL) 10 MG tablet Take 1 tablet (10 mg total) by mouth 3 (three) times daily as needed for muscle spasms. 10/11/18   Glade Lloyd, MD  benzonatate (TESSALON) 100 MG capsule Take 1 capsule (100 mg total) by mouth every 8 (eight) hours. Patient taking differently: Take 100 mg by mouth 3 (three) times daily as needed for cough.  09/19/18   Si Gaul, MD  Blood Glucose Monitoring Suppl (ACCU-CHEK AVIVA PLUS) w/Device KIT Use as dircted 07/12/17   [name], MD  clopidogrel (PLAVIX) 75 MG tablet Take 1 tablet (75 mg total) by mouth daily. YOU MAY RESTART 08/26/2018 08/25/18   Audie Box L, DO  diclofenac sodium (VOLTAREN) 1 %  GEL Apply 2 g topically 4 (four) times daily. Patient taking differently: Apply 2 g topically 4 (four) times daily as needed (pain).  04/20/18   [name], MD  dronabinol (MARINOL) 5 MG capsule Take 1 capsule (5 mg total) by mouth 2 (two) times daily before a meal. 10/18/18   Margaretmary Dys, MD  fluticasone [facility]) 50 MCG/ACT nasal spray Place 2 sprays into both nostrils daily as needed for allergies. 10/11/18   Glade Lloyd, MD  glucose blood (ACCU-CHEK AVIVA) test strip Use as instructed 07/12/17   [name], MD   hydrALAZINE (APRESOLINE) 25 MG tablet Take 1 tablet (25 mg total) by mouth 2 (two) times daily. 04/20/18   [name], MD  HYDROcodone-acetaminophen (NORCO) 5-325 MG tablet Take 1 tablet by mouth every 6 (six) hours as needed for moderate pain. 11/13/18   Si Gaul, MD  hydrocortisone-pramoxine [facility]) 2.5-1 % rectal cream Place 1 application rectally as needed. Patient taking differently: Place 1 application rectally as needed for hemorrhoids or anal itching.  10/11/18   Glade Lloyd, MD  isosorbide mononitrate (IMDUR) 120 MG 24 hr tablet Take 1 tablet (120 mg total) by mouth daily. 04/20/18   [name], MD  lidocaine-prilocaine (EMLA) cream Apply 1 application topically as needed. Apply 1 tsp on skin over port site one hour prior to chemotherapy. Cover with plastic wrap. Do not rub in medication. 11/27/18   Si Gaul, MD  LORazepam (ATIVAN) 0.5 MG tablet 1 tab po q 4-6 hours prn or 1 tab po 30 minutes prior to radiation 08/30/18   Ronny Bacon, PA-C  Nebivolol HCl 20 MG TABS Take 1 tablet (20 mg total) by mouth daily. 01/26/18   Hilty, Lisette Abu, MD  nitroGLYCERIN (NITROSTAT) 0.4 MG SL tablet Place 1 tablet (0.4 mg total) under the tongue every 5 (five) minutes as needed for chest pain. Patient taking differently: Place 0.4 mg under the tongue every 5 (five) minutes as needed for chest pain. Chest pain 12/24/16   [place] Ouch, Lennart Pall, PA-C  ondansetron (ZOFRAN) 8 MG tablet Take 1 tablet (8 mg total) by mouth every 8 (eight) hours as needed for nausea or vomiting. 09/20/18   Si Gaul, MD  prochlorperazine (COMPAZINE) 10 MG tablet Take 1 tablet (10 mg total) by mouth every 6 (six) hours as needed for nausea or vomiting. 10/23/18   Si Gaul, MD  promethazine (PHENERGAN) 25 MG tablet Take 1 tablet (25 mg total) by mouth every 6 (six) hours as needed for nausea or vomiting. 10/22/18   Melene Plan, DO     Vital Signs: BP (!) 123/97 (BP Location: Right Arm)    Pulse 93   Temp 99.2 F (37.3 C) (Oral)   Resp 18   Ht 5\' 9"  (1.753 m)   Wt 184 lb 8.4 oz (83.7 kg)   SpO2 95%   BMI 27.25 kg/m   Physical Exam G tube intact, insertion site ok, T tacks in place; site mild- mod tender, abd soft  Imaging: Ir Gastrostomy Tube Mod Sed  Result Date: 12/11/2018 INDICATION: Esophagitis EXAM: PERC PLACEMENT GASTROSTOMY MEDICATIONS: Ancef 2 g; Antibiotics were administered within 1 hour of the procedure. Glucagon 1 mg IV ANESTHESIA/SEDATION: Versed 6 mg IV; Fentanyl 100 mcg IV Moderate Sedation Time:  24 minutes The patient was continuously monitored during the procedure by the interventional radiology nurse under my direct supervision. CONTRAST:  No iodinated contrast was administered FLUOROSCOPY TIME:  Fluoroscopy Time: 3 minutes 24 seconds (81 mGy). COMPLICATIONS: None immediate. PROCEDURE:  The procedure, risks, benefits, and alternatives were explained to the patient. Questions regarding the procedure were encouraged and answered. The patient understands and consents to the procedure. The epigastrium was prepped with Betadine in a sterile fashion, and a sterile drape was applied covering the operative field. A sterile gown and sterile gloves were used for the procedure. A 5-French orogastric tube is placed under fluoroscopic guidance. Scout imaging of the abdomen confirms barium within the transverse colon. The stomach was distended with gas. Under left anterior oblique fluoroscopic guidance, an 18 gauge needle was advanced into the lumen of the stomach via anterior wall puncture and a T tack was deployed. A total of 3 T tacks were deployed in a triangular configuration. A 19 gauge needle was advanced between the T tacks and into the lumen of the stomach then removed over an Amplatz wire. The tract was dilated to 45 [place]. An 22 French peel-away sheath was inserted. An 74 French gastrostomy tube was then advanced over the wire and through the peel-away sheath into the  lumen of the stomach. The balloon was insufflated with 7 cc saline and secured in place. 60 cc of gas was injected into the gastrostomy tube. FINDINGS: Images document placement of a 13 French balloon retention gastrostomy tube into the lumen of the stomach. Gas injected into the gastrostomy tube distended the stomach compatible with proper positioning. IMPRESSION: Successful 18 French balloon retention gastrostomy tube placement. The tip is in the stomach. Electronically Signed   By: Jolaine Click M.D.   On: 12/11/2018 17:07    Labs:  CBC: Recent Labs    12/09/18 1002 12/10/18 0935 12/11/18 0443 12/12/18 0626  WBC 5.2 4.5 4.5 5.1  HGB 9.1* 8.3* 9.1* 8.8*  HCT 28.3* 26.2* 28.4* 27.7*  PLT 44* 44* 47* 51*    COAGS: Recent Labs    08/17/18 1201 09/29/18 1339 12/05/18 1242 12/11/18 0443  INR 1.1* 1.04 1.08 1.04  APTT 33.6* 28  --   --     BMP: Recent Labs    12/09/18 1002 12/10/18 0935 12/11/18 0443 12/12/18 0626  NA 143 146* 144 138  K 3.7 3.8 4.1 3.8  CL 109 114* 113* 106  CO2 25 26 25 24   GLUCOSE 132* 127* 116* 122*  BUN 15 20 27* 31*  CALCIUM 8.7* 8.7* 9.0 8.6*  CREATININE 1.46* 1.39* 1.32* 1.45*  GFRNONAA 55* 58* >60 55*  GFRAA >60 >60 >60 >60    LIVER FUNCTION TESTS: Recent Labs    11/27/18 0852 12/01/18 1918 12/02/18 0615 12/08/18 0647  BILITOT 0.5 0.7 0.8 0.6  AST 14* 20 16 13*  ALT 12 21 16 11   ALKPHOS 67 64 48 54  PROT 6.7 6.8 5.6* 5.9*  ALBUMIN 2.9* 3.1* 2.7* 2.7*    Assessment and Plan: Pt with hx lung ca, radiation esophagitis/dysphagia; s/p perc G tube 1/20; temp 99.2, WBC nl; hgb 8.8(9.1), plts 51k; ok to use gastrostomy tube; T tacks will need to be removed in 7-10 days   Electronically Signed: D. Jeananne Rama, PA-C 12/12/2018, 9:05 AM   I spent a total of 15 minutes at the the patient's bedside AND on the patient's hospital floor or unit, greater than 50% of which was counseling/coordinating care for gastrostomy tube    Patient  ID: Andrew West, male   DOB: 12-08-1965, 53 y.o.   MRN: [id number]

## 2018-12-13 ENCOUNTER — Other Ambulatory Visit: Payer: Self-pay | Admitting: Family Medicine

## 2018-12-13 DIAGNOSIS — G4709 Other insomnia: Secondary | ICD-10-CM

## 2018-12-13 DIAGNOSIS — K208 Other esophagitis: Secondary | ICD-10-CM

## 2018-12-13 LAB — GLUCOSE, CAPILLARY
Glucose-Capillary: 120 mg/dL — ABNORMAL HIGH (ref 70–99)
Glucose-Capillary: 126 mg/dL — ABNORMAL HIGH (ref 70–99)
Glucose-Capillary: 131 mg/dL — ABNORMAL HIGH (ref 70–99)

## 2018-12-13 MED ORDER — MAGIC MOUTHWASH W/LIDOCAINE: 10.0000 mL | Freq: Three times a day (TID) | ORAL | 0 refills | Status: AC | PRN

## 2018-12-13 MED ORDER — HEPARIN SOD (PORK) LOCK FLUSH 100 UNIT/ML IV SOLN
500.0000 [IU] | Freq: Once | INTRAVENOUS | Status: AC
  Administered 2018-12-13: 500 [IU]
  Filled 2018-12-13: qty 5

## 2018-12-13 MED ORDER — OXYCODONE HCL 5 MG/5ML PO SOLN: 10.0000 mg | ORAL | 0 refills | Status: AC | PRN

## 2018-12-13 MED FILL — CMPD MCO MMW 3:1 VISC LIDO: 10 days supply | Qty: 320 | Fill #0

## 2018-12-13 MED FILL — oxyCODONE HCL 5 MG/5ML SOLN: 5 | 5 days supply | Qty: 300 | Fill #0

## 2018-12-13 NOTE — Discharge Instructions (Signed)
How to Care for a Feeding Tube  A feeding tube is a tube used to give medicine, water, and liquid food. A person may have this tube if she or he has trouble swallowing or cannot take food or medicine. Supplies needed to care for the tube site:  Clean gloves.  Clean washcloth, gauze pads, or soft paper towel.  Cotton swabs.  A skin barrier ointment or cream, such as petroleum jelly.  Soap and water.  Pre-cut foam pads or gauze (for around the tube).  Tube tape.  An anchoring device (optional). How to care for the tube site  1. Have all supplies ready and close to you. 2. Wash your hands. 3. Put on gloves. 4. Change any pad or gauze near the tube if: ? It is dirty. ? It is wet. ? It has been there for more than one day. 5. Check the skin around the tube. Call the doctor if you see any of these: ? Red skin. ? A rash. ? Swelling. ? Leaking fluid. ? Extra skin. 6. Dip the gauze and cotton swabs in water and soap. 7. Use the cotton swabs to wipe the skin that is closest to the tube. 8. Use the gauze pads to wipe the rest of the skin near the tube. 9. Rinse with water. 10. Dry the area with a clean washcloth, dry gauze pad, or soft paper towel. 11. If the skin is red, use a cotton swab to put on a skin barrier cream or ointment. Put it on by making little circles. Do not apply antibiotic ointments at the tube site. 12. Put a new pre-cut foam pad or gauze around the tube. If there is no fluid at the tube site, you do not need a pad or gauze. 13. Tape down the edges. 14. Use tape or an anchoring device to attach the tube to the skin. Do this for comfort or as told. Each time you use tape, put it in a different place. 15. Sit the person up. 16. Throw away used supplies. 17. Take off your gloves. 18. Wash your hands. Supplies needed to flush a feeding tube:  Clean gloves.  A clean 60 mL syringe that connects to the feeding tube.  A towel.  Germ-free (sterile) or purified  water. Follow these rules: ? Use germ-free water if:  Your body's defense system (immune system) is weak and you have trouble getting better from infections (are immunocompromised).  You do not know how many chemicals are in your water. ? Do not use water from lakes or other bodies of water unless you treat it or filter it first. ? To make drinking water pure by boiling:  Boil water for 1 minute or longer. Keep a lid over the water while it boils.  Let the water cool off to room temperature before you use it. How to flush a feeding tube 1. Have all supplies ready and close to you. 2. Wash your hands. 3. Put on gloves. 4. Pull 30 mL of water into the syringe. 5. Before you push water into (flush) the tube, put the towel under the tube to catch any fluid leaks. 6. Bend (kink) the feeding tube while you do one of these things: ? Disconnect it from the feeding-bag tubing. ? Take off the cap at the end of the tube. 7. Put the tip of the syringe into the end of the feeding tube. 8. Stop bending the tube. 9. Use the syringe to slowly put the water  in the tube. If the water will not go in the tube: ? Have the person lie on his or her left side. ? Try putting the water in the tube again. ? Do not push hard to make the water go in. 10. Take out the syringe and put the cap on the tube. 11. Throw away used supplies. 12. Take off your gloves. 13. Wash your hands. Follow these instructions at home: Caring for the tube  If the person has a foam pad or gauze near the tube, change it: ? Every day. ? When it is dirty. ? When it is wet.  Do not put antibiotic ointments by the tube. Flushing the tube  Do not use a syringe that is smaller than 60 mL.  Flush the tube at all of these times: ? Before you give medicine. ? Between medicines. ? After the person gets the last medicine before a feeding.  Do not mix medicines with formula. Do not mix medicines with other medicines.  Completely  flush medicines through the tube. That way, they will not mix with formula. Contact a doctor if:  The tube gets blocked or clogged.  You find any of these on the skin around the tube site: ? Red skin. ? A rash. ? Swelling. ? Leaking fluid. ? Extra skin. Summary  A feeding tube is a tube used to give medicine, water, and liquid food. A person may have this tube if she or he has trouble swallowing or cannot take food or medicine.  Follow the doctor's instructions to care for the tube site and flush the tube every day.  Contact a doctor if the tube gets blocked or clogged. This information is not intended to replace advice given to you by your health care provider. Make sure you discuss any questions you have with your health care provider. Document Released: 08/02/2012 Document Revised: 12/17/2016 Document Reviewed: 12/17/2016 Elsevier Interactive Patient Education  2019 [employer].

## 2018-12-13 NOTE — Progress Notes (Signed)
Pt discharged home with care of wife. Pt education completed, all discharge medications and tube maintenance reviewed. No concerns or questions at this time. All pt belongings sent with pt at time of discharge.

## 2018-12-13 NOTE — Discharge Summary (Signed)
Physician Discharge Summary  Andrew West BJY:[id number] DOB: 20-Jan-1966 DOA: 12/01/2018  PCP: [name], MD  Admit date: 12/01/2018 Discharge date: 12/13/2018  Admitted From: Home Disposition:  Home  Recommendations for Outpatient Follow-up:  1. Follow up with PCP in 1-2 weeks 2. Please obtain BMP/CBC in one week   Home Health: Yes HHRN Equipment/Devices: PEG tube  Discharge Condition: stable  CODE STATUS: FULL Diet recommendation: Clear liquid diet, advance as tolerated Tube feedings:Goal: Osmolite 1.5 240 ml QID via PEG. Infuse Osmolite 1.5 @ 70 ml/hr x 12 hours at night. Free water flushes of 120 ml before and after each bolus and infusion (total: ) Provides 2680 kcal, 112g protein and 2564 ml H2O.  Brief/Interim Summary:  #) Failure to thrive/dysphagia/nausea/vomiting/odynophagia status post PEG tube placement on 12/11/2018:  Patient was admitted again with failure to thrive and odynophagia.  Initially patient was noted to be thrombocytopenic and neutropenic due to recent chemotherapy and so no procedure was attempted.  He was started on TPN.  He had an EGD after his platelet counts improved on 12/08/2018 that showed severe esophagitis and esophageal stricture likely secondary to radiation esophagitis.  It was recommended the patient had PEG tube placed.  PEG tube was placed by interventional radiology on 12/11/2018.  Nutrition was consulted and recommended Osmolite 1.5 kcal 240 mL's 4 times daily with bolus feeds with PEG tube as well as an infusion of 70 mils an hour 12 hours overnight along with free water flushes 120 mL's before and after each bolus and infusion.  Patient will follow-up outpatient for further adjustment of his tube feeding regimen.  He tolerated this well and TPN was discontinued.  #) Influenza B/right lower lobe pneumonia: Patient was admitted with diffuse weakness and found to have influenza B infection as well as chest x-ray that showed right lower  lobe pneumonia concerning for possible postobstructive cause.  Patient completed oseltamivir and IV cefepime.  Blood cultures on admission were negative.  #) Pancytopenia: This was secondary to chemotherapy.  His labs are monitored daily and they improved.  Patient did have 2 units of platelets prior to his EGD on 12/08/2018.  His clopidogrel was held due to thrombocytopenia but may be restarted once his platelet count increases over 50,000.  #) Hypertension/hyperlipidemia: Patient's home amlodipine, isosorbide mononitrate, nebivolol, hydralazine were held in the setting of being n.p.o. due to severe odynophagia.  These were restarted on discharge.  He was continued on his home rosuvastatin.  #) Coronary artery disease status post stent: Patient is no longer considered a candidate for any nonsurgical interventions.  He was continued on aspirin 81 mg, beta-blocker, long-acting nitrate, statin, no losing.  His clopidogrel was held for thrombocytopenia but may be restarted once his platelets increase over 50,000.  #) COPD: Patient was continued on LIMA/ICS/LABA and PRN short-acting bronchodilators.  #) Small cell lung cancer: Chemotherapy was given on 11/23/2018.  Patient's primary oncologist Dr. Shirline Frees was notified.  #) BPH: Patient was continued on home prazosin  #) Pain/psych: Patient was continued on home dronabinol, fluoxetine, prazosin.  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)   Odynophagia   N&V (nausea and vomiting)   Antineoplastic chemotherapy induced pancytopenia (HCC)   Esophageal stricture   Malnutrition of moderate degree   CKD (chronic kidney disease) stage 3, GFR 30-59 ml/min [facility])    Discharge Instructions  Discharge Instructions    Call MD for:  difficulty breathing, headache or visual disturbances   Complete by:  As directed     Call MD for:  hives   Complete by:  As directed    Call MD for:  persistant dizziness or light-headedness   Complete by:  As directed    Call MD for:  persistant nausea and vomiting   Complete by:  As directed    Call MD for:  redness, tenderness, or signs of infection (pain, swelling, redness, odor or green/yellow discharge around incision site)   Complete by:  As directed    Call MD for:  severe uncontrolled pain   Complete by:  As directed    Call MD for:  temperature >100.4   Complete by:  As directed    Diet - low sodium heart healthy   Complete by:  As directed    Discharge instructions   Complete by:  As directed    Please take your tube feedings as prescribed.  Please take a liquid diet and slowly increase it.  Please drink lots of water with her pills.  Please follow-up with your primary care doctor in 1 week.   For home use only DME Tube feeding   Complete by:  As directed    Osmolite 1.5 240 ml QID via PEG. Infuse Osmolite 1.5 @ 70 ml/hr x 12 hours at night. Free water flushes of 120 ml before and after each bolus and infusion (total: )   Increase activity slowly   Complete by:  As directed      Allergies as of 12/13/2018      Reactions   Iohexol Anaphylaxis   PT. TO BE PREMEDICATED PRIOR TO IV CONTRAST PER DR Eppie Gibson /MMS//12/15/15Desc: PT BECAME SOB AND CHEST TIGHTNESS AFTER CONTRAST INJECTION.  [name],RT-RCT., Onset Date: [id number]      Medication List    STOP taking these medications   HYDROcodone-acetaminophen 5-325 MG tablet Commonly known as:  NORCO     TAKE these medications   ACCU-CHEK AVIVA PLUS w/Device Kit Use as dircted   albuterol 108 (90 Base) MCG/ACT inhaler Commonly known as:  PROAIR HFA Inhale 1 puff into the lungs every 6 (six) hours as needed for wheezing or shortness of breath.   amLODipine 10 MG tablet Commonly known as:  NORVASC Take 1 tablet (10 mg total) by mouth daily.   aspirin 81 MG tablet Take 1 tablet (81 mg total)  daily by mouth.   baclofen 10 MG tablet Commonly known as:  LIORESAL Take 1 tablet (10 mg total) by mouth 3 (three) times daily as needed for muscle spasms.   benzonatate 100 MG capsule Commonly known as:  TESSALON Take 1 capsule (100 mg total) by mouth every 8 (eight) hours. What changed:    when to take this  reasons to take this   clopidogrel 75 MG tablet Commonly known as:  PLAVIX Take 1 tablet (75 mg total) by mouth daily. YOU MAY RESTART 08/26/2018   diclofenac sodium 1 % Gel Commonly known as:  VOLTAREN Apply 2 g topically 4 (four) times daily. What changed:    when to take this  reasons to take this   dronabinol 5 MG capsule Commonly known as:  MARINOL Take 1 capsule (5 mg total) by mouth 2 (two) times daily before a meal.   FLUoxetine 40 MG capsule Commonly known as:  PROZAC Take 1 capsule (40 mg total) by mouth daily.   fluticasone 50  MCG/ACT nasal spray Commonly known as:  FLONASE Place 2 sprays into both nostrils daily as needed for allergies.   Fluticasone-Salmeterol 100-50 MCG/DOSE Aepb Commonly known as:  ADVAIR Inhale 1 puff into the lungs 2 (two) times daily.   glucose blood test strip Commonly known as:  ACCU-CHEK AVIVA Use as instructed   hydrALAZINE 25 MG tablet Commonly known as:  APRESOLINE Take 1 tablet (25 mg total) by mouth 2 (two) times daily.   hydrocortisone-pramoxine 2.5-1 % rectal cream Commonly known as:  ANALPRAM-HC Place 1 application rectally as needed. What changed:  reasons to take this   isosorbide mononitrate 120 MG 24 hr tablet Commonly known as:  IMDUR Take 1 tablet (120 mg total) by mouth daily.   lidocaine-prilocaine cream Commonly known as:  EMLA Apply 1 application topically as needed. Apply 1 tsp on skin over port site one hour prior to chemotherapy. Cover with plastic wrap. Do not rub in medication.   LORazepam 0.5 MG tablet Commonly known as:  ATIVAN 1 tab po q 4-6 hours prn or 1 tab po 30 minutes prior  to radiation   magic mouthwash w/lidocaine Soln Take 10 mLs by mouth 3 (three) times daily as needed for up to 20 days for mouth pain.   mirtazapine 30 MG tablet Commonly known as:  REMERON Take 1 tablet (30 mg total) by mouth at bedtime.   Nebivolol HCl 20 MG Tabs Take 1 tablet (20 mg total) by mouth daily.   nicotine 21 mg/24hr patch Commonly known as:  NICODERM CQ Place 1 patch (21 mg total) onto the skin daily.   nitroGLYCERIN 0.4 MG SL tablet Commonly known as:  NITROSTAT Place 1 tablet (0.4 mg total) under the tongue every 5 (five) minutes as needed for chest pain. What changed:  additional instructions   ondansetron 8 MG tablet Commonly known as:  ZOFRAN Take 1 tablet (8 mg total) by mouth every 8 (eight) hours as needed for nausea or vomiting.   oxyCODONE 5 MG/5ML solution Commonly known as:  ROXICODONE Take 10 mLs (10 mg total) by mouth every 4 (four) hours as needed for up to 7 days for severe pain.   pantoprazole 40 MG tablet Commonly known as:  PROTONIX Take 1 tablet (40 mg total) by mouth daily.   Potassium Chloride ER 20 MEQ Tbcr Take 10 mEq by mouth daily.   prazosin 1 MG capsule Commonly known as:  MINIPRESS Take 1 capsule (1 mg total) by mouth at bedtime. For nightmares   prochlorperazine 10 MG tablet Commonly known as:  COMPAZINE Take 1 tablet (10 mg total) by mouth every 6 (six) hours as needed for nausea or vomiting.   promethazine 25 MG tablet Commonly known as:  PHENERGAN Take 1 tablet (25 mg total) by mouth every 6 (six) hours as needed for nausea or vomiting.   ranolazine 1000 MG SR tablet Commonly known as:  RANEXA Take 1 tablet (1,000 mg total) by mouth 2 (two) times daily.   rosuvastatin 20 MG tablet Commonly known as:  CRESTOR Take 1 tablet (20 mg total) by mouth daily.   sucralfate 1 g tablet Commonly known as:  CARAFATE Take 1 tablet by mouth 4 (four) times daily -  before meals and at bedtime.   tiotropium 18 MCG inhalation  capsule Commonly known as:  SPIRIVA HANDIHALER Place 1 capsule (18 mcg total) into inhaler and inhale daily.   vitamin B-12 500 MCG tablet Commonly known as:  CYANOCOBALAMIN Take 1 tablet (500 mcg  total) by mouth daily.   Vitamin D 50 MCG (2000 UT) tablet Take 1 tablet (2,000 Units total) by mouth daily.   zolpidem 5 MG tablet Commonly known as:  AMBIEN Take 1 tablet (5 mg total) by mouth at bedtime as needed for sleep. What changed:  when to take this            Durable Medical Equipment  (From admission, onward)         Start     Ordered   12/13/18 0000  For home use only DME Tube feeding    Comments:  Osmolite 1.5 240 ml QID via PEG. Infuse Osmolite 1.5 @ 70 ml/hr x 12 hours at night. Free water flushes of 120 ml before and after each bolus and infusion (total: )   12/13/18 0856   12/12/18 1337  For home use only DME Tube feeding pump  Once     12/12/18 1336          Allergies  Allergen Reactions  . Iohexol Anaphylaxis    PT. TO BE PREMEDICATED PRIOR TO IV CONTRAST PER DR Eppie Gibson /MMS//12/15/15Desc: PT BECAME SOB AND CHEST TIGHTNESS AFTER CONTRAST INJECTION.  Ardis Hughs., Onset Date: [id number]     Consultations:  Interventional radiology  Oncology, Dr. Shirline Frees  GI   Procedures/Studies: Ct Abdomen Pelvis Wo Contrast  Result Date: 12/01/2018 CLINICAL DATA:  Abdominal pain and nausea.  History of lung cancer. EXAM: CT ABDOMEN AND PELVIS WITHOUT CONTRAST TECHNIQUE: Multidetector CT imaging of the abdomen and pelvis was performed following the standard protocol without IV contrast. COMPARISON:  CT abdomen pelvis dated October 31, 2018. FINDINGS: Lower chest: Unchanged peripheral nodular consolidation in the right lower lobe. Hepatobiliary: No focal liver abnormality is seen. Small layering gallstones versus sludge. No gallbladder wall thickening or biliary dilatation. Pancreas: Unremarkable. No pancreatic ductal dilatation or surrounding  inflammatory changes. Spleen: Normal in size without focal abnormality. Adrenals/Urinary Tract: Adrenal glands are unremarkable. Kidneys are normal, without renal calculi, focal lesion, or hydronephrosis. Bladder is unremarkable. Stomach/Bowel: Stomach is within normal limits. Appendix appears normal. No evidence of bowel wall thickening, distention, or inflammatory changes. Vascular/Lymphatic: Aortoiliac atherosclerosis. No enlarged abdominal or pelvic lymph nodes. Reproductive: Prostate is unremarkable. Other: Unchanged small fat containing umbilical and right inguinal hernias. No free fluid or pneumoperitoneum. Musculoskeletal: No acute or significant osseous findings. IMPRESSION: 1.  No acute intra-abdominal process. 2. Unchanged peripheral nodular consolidation in the right lower lobe. 3.  Aortic atherosclerosis (ICD10-I70.0). Electronically Signed   By: Obie Dredge M.D.   On: 12/01/2018 20:09   Dg Chest 2 View  Result Date: 12/01/2018 CLINICAL DATA:  Weakness.  History of lung cancer. EXAM: CHEST - 2 VIEW COMPARISON:  CT chest from same day. Chest x-ray dated November 02, 2018. FINDINGS: Unchanged left chest wall port catheter. The heart size and mediastinal contours are within normal limits. Normal pulmonary vascularity. Patchy, nodular consolidation in the right lower lobe, better evaluated on chest CT from same day. No pleural effusion or pneumothorax. No acute osseous abnormality. IMPRESSION: 1. Patchy, nodular consolidation in the right lower lobe, better evaluated on chest CT from same day. Electronically Signed   By: Obie Dredge M.D.   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 Restaging. EXAM: CT CHEST WITHOUT CONTRAST TECHNIQUE: Multidetector CT imaging of the chest was performed following the standard  protocol without IV  contrast. COMPARISON:  10/20/2018 chest CT. FINDINGS: Cardiovascular: Normal heart size. No significant pericardial effusion/thickening. Three-vessel coronary atherosclerosis. Left internal jugular Port-A-Cath terminates at the cavoatrial junction. Atherosclerotic nonaneurysmal thoracic aorta. Normal caliber pulmonary arteries. Mediastinum/Nodes: No discrete thyroid nodules. Unremarkable esophagus. No axillary adenopathy. Mildly enlarged 1.5 cm lower right paratracheal node (series 2/image 61), previously 1.5 cm using similar measurement technique, stable. Mildly enlarged 1.2 cm subcarinal node (series 2/image 70), stable. No new pathologically enlarged mediastinal nodes. No discrete hilar adenopathy on this noncontrast scan. Lungs/Pleura: No pneumothorax. No pleural effusion. Mild-to-moderate centrilobular and paraseptal emphysema with prominent diffuse bronchial wall thickening. Previously described 2.3 cm focus of consolidation in the anterior peripheral right lower lobe on 10/20/2018 chest CT study has resolved with small residual linear scar in this location. New patchy irregular nodular foci of consolidation in the posterior right lower lobe, largest 2.3 x 2.2 cm (series 5/image 110) with some central ground-glass density. No additional significant pulmonary nodules. Upper abdomen: No acute abnormality. Musculoskeletal: No aggressive appearing focal osseous lesions. Mild thoracic spondylosis. IMPRESSION: 1. Right paratracheal and subcarinal lymphadenopathy is stable. No new or progressive thoracic adenopathy. 2. Waxing and waning irregular nodular foci of consolidation at the periphery of the right lung base. Differential considerations include cryptogenic organizing pneumonia and recurrent infectious/inflammatory pneumonia (including aspiration pneumonia), with metastatic spread considered less likely but not entirely excluded. Continued close chest CT follow-up advised. Aortic Atherosclerosis (ICD10-I70.0)  and Emphysema (ICD10-J43.9). Electronically Signed   By: Delbert Phenix M.D.   On: 12/01/2018 11:36   Ir Gastrostomy Tube Mod Sed  Result Date: 12/11/2018 INDICATION: Esophagitis EXAM: PERC PLACEMENT GASTROSTOMY MEDICATIONS: Ancef 2 g; Antibiotics were administered within 1 hour of the procedure. Glucagon 1 mg IV ANESTHESIA/SEDATION: Versed 6 mg IV; Fentanyl 100 mcg IV Moderate Sedation Time:  24 minutes The patient was continuously monitored during the procedure by the interventional radiology nurse under my direct supervision. CONTRAST:  No iodinated contrast was administered FLUOROSCOPY TIME:  Fluoroscopy Time: 3 minutes 24 seconds (81 mGy). COMPLICATIONS: None immediate. PROCEDURE: The procedure, risks, benefits, and alternatives were explained to the patient. Questions regarding the procedure were encouraged and answered. The patient understands and consents to the procedure. The epigastrium was prepped with Betadine in a sterile fashion, and a sterile drape was applied covering the operative field. A sterile gown and sterile gloves were used for the procedure. A 5-French orogastric tube is placed under fluoroscopic guidance. Scout imaging of the abdomen confirms barium within the transverse colon. The stomach was distended with gas. Under left anterior oblique fluoroscopic guidance, an 18 gauge needle was advanced into the lumen of the stomach via anterior wall puncture and a T tack was deployed. A total of 3 T tacks were deployed in a triangular configuration. A 19 gauge needle was advanced between the T tacks and into the lumen of the stomach then removed over an Amplatz wire. The tract was dilated to 43 [place]. An 49 French peel-away sheath was inserted. An 44 French gastrostomy tube was then advanced over the wire and through the peel-away sheath into the lumen of the stomach. The balloon was insufflated with 7 cc saline and secured in place. 60 cc of gas was injected into the gastrostomy tube. FINDINGS:  Images document placement of a 32 French balloon retention gastrostomy tube into the lumen of the stomach. Gas injected into the gastrostomy tube distended the stomach compatible with proper positioning. IMPRESSION: Successful 18 French balloon retention gastrostomy tube placement.  The tip is in the stomach. Electronically Signed   By: Jolaine Click M.D.   On: 12/11/2018 17:07    EGD 12/08/2018:Impression: Not Applicable - Patient had care per Anesthesia. Moderate Sedation: - Return patient to hospital ward for ongoing care. Likely this is acute radiation related esophagitis. It will take time for the damage to heal. It may be weeks before he is able to provide adequate nurtional orally and so I still recommend that a G tube be placed. He is able to manage his secretions fortunately. Recommendation: [facility]   Subjective:   Discharge Exam: Vitals:   12/13/18 0551 12/13/18 0751  BP: 109/81   Pulse:    Resp: 18   Temp: 98.8 F (37.1 C)   SpO2: 98% 95%   Vitals:   12/12/18 1935 12/12/18 2050 12/13/18 0551 12/13/18 0751  BP:  (!) 119/93 109/81   Pulse:  96    Resp:  18 18   Temp:  98.7 F (37.1 C) 98.8 F (37.1 C)   TempSrc:  Oral Oral   SpO2: 97% 95% 98% 95%  Weight:      Height:       General exam:Appears calm and comfortable  Respiratory system: Clear to auscultation. Respiratory effort normal. No wheezing Cardiovascular system:Regular rate and rhythm, no murmurs Gastrointestinal system:Soft, nondistended, no rebound or guarding, plus bowel sounds Central nervous system:Alert and oriented. Grossly intact, moving all extremities Extremities: No lower extremity edema. Skin: Port site is clean dry and intact, G-tube site is clean dry and intact Psychiatry:Judgement and insight appear normal. Mood &affect appropriate.     The results of significant diagnostics from this hospitalization (including imaging, microbiology, ancillary and  laboratory) are listed below for reference.     Microbiology: No results found for this or any previous visit (from the past 240 hour(s)).   Labs: BNP (last 3 results) No results for input(s): BNP in the last 8760 hours. Basic Metabolic Panel: Recent Labs  Lab 12/07/18 0622 12/08/18 0647 12/09/18 1002 12/10/18 0935 12/11/18 0443 12/12/18 0626  NA 140 141 143 146* 144 138  K 3.5 3.4* 3.7 3.8 4.1 3.8  CL 107 107 109 114* 113* 106  CO2 24 25 25 26 25 24   GLUCOSE 71 94 132* 127* 116* 122*  BUN 11 14 15 20  27* 31*  CREATININE 1.49* 1.58* 1.46* 1.39* 1.32* 1.45*  CALCIUM 8.5* 8.6* 8.7* 8.7* 9.0 8.6*  MG 1.2* 1.9 1.7 1.8 1.9  --   PHOS 1.8* 2.4* 1.9* 3.3 3.5  --    Liver Function Tests: Recent Labs  Lab 12/08/18 0647  AST 13*  ALT 11  ALKPHOS 54  BILITOT 0.6  PROT 5.9*  ALBUMIN 2.7*   No results for input(s): LIPASE, AMYLASE in the last 168 hours. No results for input(s): AMMONIA in the last 168 hours. CBC: Recent Labs  Lab 12/07/18 0622 12/08/18 0647 12/09/18 1002 12/10/18 0935 12/11/18 0443 12/12/18 0626  WBC 0.9* 2.5* 5.2 4.5 4.5 5.1  NEUTROABS 0.2* 1.2* 3.3  --  2.7  --   HGB 9.7* 9.2* 9.1* 8.3* 9.1* 8.8*  HCT 29.9* 28.0* 28.3* 26.2* 28.4* 27.7*  MCV 88.2 86.7 89.6 90.3 89.6 89.9  PLT 32* 33* 44* 44* 47* 51*   Cardiac Enzymes: No results for input(s): CKTOTAL, CKMB, CKMBINDEX, TROPONINI in the last 168 hours. BNP: Invalid input(s): POCBNP CBG: Recent Labs  Lab 12/12/18 1151 12/12/18 1700 12/12/18 1901 12/13/18 0024 12/13/18 0720  GLUCAP 102* 68*  115* 131* 120*   D-Dimer No results for input(s): DDIMER in the last 72 hours. Hgb A1c No results for input(s): HGBA1C in the last 72 hours. Lipid Profile Recent Labs    12/11/18 0443  TRIG 168*   Thyroid function studies No results for input(s): TSH, T4TOTAL, T3FREE, THYROIDAB in the last 72 hours.  Invalid input(s): FREET3 Anemia work up No results for input(s): VITAMINB12, FOLATE,  FERRITIN, TIBC, IRON, RETICCTPCT in the last 72 hours. Urinalysis    Component Value Date/Time   COLORURINE YELLOW 12/02/2018 0018   APPEARANCEUR HAZY (A) 12/02/2018 0018   LABSPEC 1.014 12/02/2018 0018   PHURINE 5.0 12/02/2018 0018   GLUCOSEU NEGATIVE 12/02/2018 0018   GLUCOSEU NEGATIVE 12/30/2008 1531   HGBUR NEGATIVE 12/02/2018 0018   BILIRUBINUR NEGATIVE 12/02/2018 0018   BILIRUBINUR small 10/10/2017 1455   KETONESUR 5 (A) 12/02/2018 0018   PROTEINUR NEGATIVE 12/02/2018 0018   UROBILINOGEN 1.0 10/10/2017 1455   UROBILINOGEN 1.0 01/23/2014 2333   NITRITE NEGATIVE 12/02/2018 0018   LEUKOCYTESUR NEGATIVE 12/02/2018 0018   Sepsis Labs Invalid input(s): PROCALCITONIN,  WBC,  LACTICIDVEN Microbiology No results found for this or any previous visit (from the past 240 hour(s)).   Time coordinating discharge: 35  SIGNED:   Delaine Lame, MD  [facility] 12/13/2018, 8:56 AM  If 7PM-7AM, please contact night-coverage [email or web address] Password [email or web address]

## 2018-12-14 ENCOUNTER — Other Ambulatory Visit (HOSPITAL_COMMUNITY): Payer: Self-pay | Admitting: Interventional Radiology

## 2018-12-14 DIAGNOSIS — Z431 Encounter for attention to gastrostomy: Secondary | ICD-10-CM

## 2018-12-14 NOTE — Telephone Encounter (Signed)
Pt last seen: 09/14/18 Next appt: 12/19/18 Last RX written on: 09/14/18 Date of original fill: 09/14/18 Date of refill(s): 10/18/18, 11/27/18  The following controlled substances were also filled during this time: Fentanyl Patches 84mcg/hr #5/15ds written by Dr. Julien Nordmann on 09/19/18, filled 09/19/18 Hydrocodone w/ APAP 5-325mg  #120tabs/15ds written by Dr. Lisbeth Renshaw on 09/29/18, filled on 09/29/18 Hydrocodone w/ APAP 5-325mg  #120tabs/15ds written by Dr. Vincent Gros on 10/17/18, filled 10/17/18 Dronabinol 5mg  #60tabs/30ds written by Dr. Tammi Klippel on 10/18/18, filled 10/18/18 Hydrocodone w/ APAP 5-325mg  #120tabs/30ds written by Dr. Julien Nordmann on 11/13/18, filled 11/13/18 Oxycodone 5mg /66mL #352mLs/5ds written by Dr. Herbert Moors on 12/13/18, filled 12/13/18   Please refill if appropriate.

## 2018-12-15 ENCOUNTER — Telehealth: Payer: Self-pay | Admitting: *Deleted

## 2018-12-15 DIAGNOSIS — R627 Adult failure to thrive: Secondary | ICD-10-CM | POA: Diagnosis not present

## 2018-12-15 DIAGNOSIS — J449 Chronic obstructive pulmonary disease, unspecified: Secondary | ICD-10-CM | POA: Diagnosis not present

## 2018-12-15 DIAGNOSIS — I2510 Atherosclerotic heart disease of native coronary artery without angina pectoris: Secondary | ICD-10-CM | POA: Diagnosis not present

## 2018-12-15 DIAGNOSIS — Z7902 Long term (current) use of antithrombotics/antiplatelets: Secondary | ICD-10-CM | POA: Diagnosis not present

## 2018-12-15 DIAGNOSIS — K208 Other esophagitis: Secondary | ICD-10-CM | POA: Diagnosis not present

## 2018-12-15 DIAGNOSIS — Z7951 Long term (current) use of inhaled steroids: Secondary | ICD-10-CM | POA: Diagnosis not present

## 2018-12-15 DIAGNOSIS — Z87891 Personal history of nicotine dependence: Secondary | ICD-10-CM | POA: Diagnosis not present

## 2018-12-15 DIAGNOSIS — I129 Hypertensive chronic kidney disease with stage 1 through stage 4 chronic kidney disease, or unspecified chronic kidney disease: Secondary | ICD-10-CM | POA: Diagnosis not present

## 2018-12-15 DIAGNOSIS — C3411 Malignant neoplasm of upper lobe, right bronchus or lung: Secondary | ICD-10-CM | POA: Diagnosis not present

## 2018-12-15 DIAGNOSIS — R1310 Dysphagia, unspecified: Secondary | ICD-10-CM | POA: Diagnosis not present

## 2018-12-15 DIAGNOSIS — N183 Chronic kidney disease, stage 3 (moderate): Secondary | ICD-10-CM | POA: Diagnosis not present

## 2018-12-15 DIAGNOSIS — Z431 Encounter for attention to gastrostomy: Secondary | ICD-10-CM | POA: Diagnosis not present

## 2018-12-15 DIAGNOSIS — Z7982 Long term (current) use of aspirin: Secondary | ICD-10-CM | POA: Diagnosis not present

## 2018-12-15 DIAGNOSIS — E1122 Type 2 diabetes mellitus with diabetic chronic kidney disease: Secondary | ICD-10-CM | POA: Diagnosis not present

## 2018-12-15 NOTE — Telephone Encounter (Signed)
Received TC from pt's home. Spoke with Myriam Jacobson from [facility].  She is calling for the patient. He is requesting his pain meds by tablets as the liquid does not taste good and it makes him gag. This was ordered on 12/13/18.  Donia Guiles that pt can put this medication through his PEG tube. If he insists on taking it by mouth, he can mix it with juice, ice cream or something that he can swallow. Myriam Jacobson voiced understanding.

## 2018-12-18 ENCOUNTER — Inpatient Hospital Stay: Payer: Medicare Other

## 2018-12-18 ENCOUNTER — Other Ambulatory Visit: Payer: Self-pay

## 2018-12-18 DIAGNOSIS — R627 Adult failure to thrive: Secondary | ICD-10-CM | POA: Diagnosis not present

## 2018-12-18 DIAGNOSIS — Z431 Encounter for attention to gastrostomy: Secondary | ICD-10-CM | POA: Diagnosis not present

## 2018-12-18 DIAGNOSIS — I129 Hypertensive chronic kidney disease with stage 1 through stage 4 chronic kidney disease, or unspecified chronic kidney disease: Secondary | ICD-10-CM | POA: Diagnosis not present

## 2018-12-18 DIAGNOSIS — R1310 Dysphagia, unspecified: Secondary | ICD-10-CM | POA: Diagnosis not present

## 2018-12-18 DIAGNOSIS — I2510 Atherosclerotic heart disease of native coronary artery without angina pectoris: Secondary | ICD-10-CM | POA: Diagnosis not present

## 2018-12-18 DIAGNOSIS — K208 Other esophagitis: Secondary | ICD-10-CM | POA: Diagnosis not present

## 2018-12-18 NOTE — Telephone Encounter (Signed)
Andrew West was given verbal orders for tube feeding education for 1week5.

## 2018-12-19 ENCOUNTER — Encounter: Payer: Self-pay | Admitting: Family Medicine

## 2018-12-19 ENCOUNTER — Ambulatory Visit: Payer: Medicare Other | Attending: Family Medicine | Admitting: Family Medicine

## 2018-12-19 VITALS — BP 118/80 | HR 92 | Temp 98.4°F | Ht 69.0 in | Wt 189.0 lb

## 2018-12-19 DIAGNOSIS — Z7982 Long term (current) use of aspirin: Secondary | ICD-10-CM | POA: Insufficient documentation

## 2018-12-19 DIAGNOSIS — J449 Chronic obstructive pulmonary disease, unspecified: Secondary | ICD-10-CM

## 2018-12-19 DIAGNOSIS — M545 Low back pain: Secondary | ICD-10-CM | POA: Insufficient documentation

## 2018-12-19 DIAGNOSIS — G8929 Other chronic pain: Secondary | ICD-10-CM | POA: Diagnosis not present

## 2018-12-19 DIAGNOSIS — Z7951 Long term (current) use of inhaled steroids: Secondary | ICD-10-CM | POA: Diagnosis not present

## 2018-12-19 DIAGNOSIS — K208 Other esophagitis without bleeding: Secondary | ICD-10-CM

## 2018-12-19 DIAGNOSIS — I10 Essential (primary) hypertension: Secondary | ICD-10-CM

## 2018-12-19 DIAGNOSIS — Z79899 Other long term (current) drug therapy: Secondary | ICD-10-CM | POA: Diagnosis not present

## 2018-12-19 DIAGNOSIS — Y842 Radiological procedure and radiotherapy as the cause of abnormal reaction of the patient, or of later complication, without mention of misadventure at the time of the procedure: Secondary | ICD-10-CM | POA: Insufficient documentation

## 2018-12-19 DIAGNOSIS — R627 Adult failure to thrive: Secondary | ICD-10-CM | POA: Diagnosis not present

## 2018-12-19 DIAGNOSIS — Z7902 Long term (current) use of antithrombotics/antiplatelets: Secondary | ICD-10-CM | POA: Diagnosis not present

## 2018-12-19 DIAGNOSIS — C3411 Malignant neoplasm of upper lobe, right bronchus or lung: Secondary | ICD-10-CM | POA: Diagnosis not present

## 2018-12-19 DIAGNOSIS — K219 Gastro-esophageal reflux disease without esophagitis: Secondary | ICD-10-CM | POA: Diagnosis not present

## 2018-12-19 DIAGNOSIS — I2510 Atherosclerotic heart disease of native coronary artery without angina pectoris: Secondary | ICD-10-CM | POA: Diagnosis not present

## 2018-12-19 DIAGNOSIS — Z955 Presence of coronary angioplasty implant and graft: Secondary | ICD-10-CM

## 2018-12-19 DIAGNOSIS — Z888 Allergy status to other drugs, medicaments and biological substances status: Secondary | ICD-10-CM | POA: Diagnosis not present

## 2018-12-19 DIAGNOSIS — E785 Hyperlipidemia, unspecified: Secondary | ICD-10-CM | POA: Insufficient documentation

## 2018-12-19 DIAGNOSIS — E1100 Type 2 diabetes mellitus with hyperosmolarity without nonketotic hyperglycemic-hyperosmolar coma (NKHHC): Secondary | ICD-10-CM

## 2018-12-19 DIAGNOSIS — F331 Major depressive disorder, recurrent, moderate: Secondary | ICD-10-CM

## 2018-12-19 DIAGNOSIS — E0800 Diabetes mellitus due to underlying condition with hyperosmolarity without nonketotic hyperglycemic-hyperosmolar coma (NKHHC): Secondary | ICD-10-CM

## 2018-12-19 DIAGNOSIS — G4730 Sleep apnea, unspecified: Secondary | ICD-10-CM | POA: Diagnosis not present

## 2018-12-19 DIAGNOSIS — F419 Anxiety disorder, unspecified: Secondary | ICD-10-CM | POA: Diagnosis not present

## 2018-12-19 LAB — POCT GLYCOSYLATED HEMOGLOBIN (HGB A1C): HbA1c, POC (controlled diabetic range): 6.2 % (ref 0.0–7.0)

## 2018-12-19 LAB — GLUCOSE, POCT (MANUAL RESULT ENTRY): POC GLUCOSE: 88 mg/dL (ref 70–99)

## 2018-12-19 MED ORDER — ALBUTEROL SULFATE HFA 108 (90 BASE) MCG/ACT IN AERS: 1.0000 | INHALATION_SPRAY | Freq: Four times a day (QID) | RESPIRATORY_TRACT | 6 refills | Status: DC | PRN

## 2018-12-19 MED ORDER — FLUOXETINE HCL 40 MG PO CAPS: 40.0000 mg | ORAL_CAPSULE | Freq: Every day | ORAL | 1 refills | Status: DC

## 2018-12-19 MED ORDER — ROSUVASTATIN CALCIUM 20 MG PO TABS: 20.0000 mg | ORAL_TABLET | Freq: Every day | ORAL | 1 refills | Status: DC

## 2018-12-19 MED ORDER — CLOPIDOGREL BISULFATE 75 MG PO TABS: 75.0000 mg | ORAL_TABLET | Freq: Every day | ORAL | 1 refills | Status: DC

## 2018-12-19 MED ORDER — HYDRALAZINE HCL 25 MG PO TABS: 25.0000 mg | ORAL_TABLET | Freq: Two times a day (BID) | ORAL | 1 refills | Status: DC

## 2018-12-19 MED ORDER — AMLODIPINE BESYLATE 10 MG PO TABS: 10.0000 mg | ORAL_TABLET | Freq: Every day | ORAL | 1 refills | Status: DC

## 2018-12-19 MED ORDER — ISOSORBIDE MONONITRATE ER 120 MG PO TB24: 120.0000 mg | ORAL_TABLET | Freq: Every day | ORAL | 1 refills | Status: DC

## 2018-12-19 MED ORDER — TIOTROPIUM BROMIDE MONOHYDRATE 18 MCG IN CAPS: 18.0000 ug | ORAL_CAPSULE | Freq: Every day | RESPIRATORY_TRACT | 1 refills | Status: DC

## 2018-12-19 MED ORDER — PANTOPRAZOLE SODIUM 40 MG PO TBEC: 40.0000 mg | DELAYED_RELEASE_TABLET | Freq: Every day | ORAL | 1 refills | Status: DC

## 2018-12-19 MED ORDER — NEBIVOLOL HCL 20 MG PO TABS: 20.0000 mg | ORAL_TABLET | Freq: Every day | ORAL | 1 refills | Status: DC

## 2018-12-19 MED ORDER — FLUTICASONE-SALMETEROL 100-50 MCG/DOSE IN AEPB: 1.0000 | INHALATION_SPRAY | Freq: Two times a day (BID) | RESPIRATORY_TRACT | 3 refills | Status: DC

## 2018-12-19 MED FILL — ADVAIR 100/50 DISKUS: 100-50 | 30 days supply | Qty: 60 | Fill #0

## 2018-12-19 MED FILL — PANTOPRAZOLE SOD DR 40 MG T: 40 | 30 days supply | Qty: 30 | Fill #0

## 2018-12-19 MED FILL — hydrALAZINE HCL 25 MG TABS: 25 | 30 days supply | Qty: 60 | Fill #0

## 2018-12-19 MED FILL — CLOPIDOGREL 75 MG TABLET: 75 | 90 days supply | Qty: 90 | Fill #0

## 2018-12-19 MED FILL — SPIRIVA 18 MCG CP-HANDIHALE: 18 | 30 days supply | Qty: 30 | Fill #0

## 2018-12-19 MED FILL — PROAIR HFA 90 MCG INHALER: 108 (90 BAS | 25 days supply | Qty: 9 | Fill #0

## 2018-12-19 MED FILL — BYSTOLIC 20 MG TABLET: 20 | 30 days supply | Qty: 30 | Fill #0

## 2018-12-19 MED FILL — AMLODIPINE BESYLATE 10 MG T: 10 | 90 days supply | Qty: 90 | Fill #0

## 2018-12-19 MED FILL — ROSUVASTATIN CALCIUM 20 MG: 20 | 30 days supply | Qty: 30 | Fill #0

## 2018-12-19 MED FILL — ISOSORBIDE MN 120 MG TAB SA: 120 | 30 days supply | Qty: 30 | Fill #0

## 2018-12-19 MED FILL — FLUoxetine HCL 40 MG CAPS: 40 | 30 days supply | Qty: 30 | Fill #0

## 2018-12-19 NOTE — Progress Notes (Signed)
Subjective:  Patient ID: Andrew West, male    DOB: 1966-01-31  Age: 53 y.o. MRN: [id number]  CC: Diabetes   HPI DEVLEN Andrew West  is a 53 y.o. 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 Transfused 2 units of platelets due to pancytopenia.  He continues to complain of a decreased appetite and relies on his PEG tube for his feedings.  He feels weak and is in pain; states he has run out of his pain medication (oxycodone) and can only obtain a refill when he goes to see Dr. Shirline Frees for an appointment. Last visit to oncology was on 11/10/2018. He has a home health aide who comes in once a week and he has the support of his spouse and children.  Past Medical History:  Diagnosis Date  . Anxiety   . CAD (coronary artery disease)    a. s/p multiple PCIs with last cath 11/2016 with severe multivessel CAD, s/p PCTA to LCx but unable to pass stent  . Chronic leg pain    bilateral  . Chronic lower back pain   . COPD (chronic obstructive pulmonary disease) (HCC)   . Depression   . GERD (gastroesophageal reflux disease)    Takes Dexilant  . HLD (hyperlipidemia)   . Hypertension   . Rhabdomyolysis    h/o, r/t statins  . Sleep apnea    "can't tolerate mask" (12/16/2016)  . Type II diabetes mellitus (HCC)     Past Surgical History:  Procedure Laterality Date  . BACK SURGERY    . BRONCHIAL BIOPSY  08/25/2018   Procedure: BRONCHIAL  BIOPSIES;  Surgeon: Josephine Igo, DO;  Location: [facility] ENDOSCOPY;  Service: Cardiopulmonary;;  . CARDIAC CATHETERIZATION N/A 09/25/2015   Procedure: Left Heart Cath and Coronary Angiography;  Surgeon: Marykay Lex, MD;  Location: [facility] INVASIVE CV LAB;  Service: Cardiovascular;  Laterality: N/A;  . CARDIAC CATHETERIZATION N/A 12/16/2016   Procedure: Left Heart Cath and Coronary Angiography;  Surgeon: Marykay Lex, MD;  Location: [facility] INVASIVE CV LAB;  Service: Cardiovascular;  Laterality: N/A;  . CARDIAC CATHETERIZATION N/A 12/16/2016   Procedure: Coronary Balloon Angioplasty;  Surgeon: Marykay Lex, MD;  Location: [facility] INVASIVE CV LAB;  Service: Cardiovascular;  Laterality: N/A;  . COLONOSCOPY W/ POLYPECTOMY    . CORONARY ANGIOPLASTY  09/25/2015   mid cir & om  . CORONARY ANGIOPLASTY WITH STENT PLACEMENT  10/09/2001   PTCA & stenting of mid AV circumflex; 2.5x106mm Pixel stent  . CORONARY ANGIOPLASTY WITH STENT PLACEMENT  12/13/2001   PCI with stent to mid L circumflex, 95% stenosis to 0% residual  . CORONARY ANGIOPLASTY WITH STENT PLACEMENT  10/10/2003   PCI to mid AV circumflex; LAD 30% disease; RCA 100% occluded prox.  . CORONARY ANGIOPLASTY WITH STENT PLACEMENT  09/01/2011   PCI with stenting with bare metal stent to mid AV groove circumflex and PDA  . CORONARY ANGIOPLASTY WITH STENT PLACEMENT  10/17/2011  cutting balloon angioplasty of ostial lateral OM1 branch and bifurcation AV groove circumflex OM junction; stenosis reduced to 0%  . ENDOBRONCHIAL ULTRASOUND Bilateral 08/25/2018   Procedure: ENDOBRONCHIAL ULTRASOUND;  Surgeon: Josephine Igo, DO;  Location: [facility] ENDOSCOPY;  Service: Cardiopulmonary;  Laterality: Bilateral;  . ESOPHAGOGASTRODUODENOSCOPY (EGD) WITH PROPOFOL N/A 12/08/2018   Procedure: ESOPHAGOGASTRODUODENOSCOPY (EGD) WITH PROPOFOL;  Surgeon: Rachael Fee, MD;  Location: [facility] ENDOSCOPY;  Service: Endoscopy;  Laterality: N/A;  . EXCISIONAL HEMORRHOIDECTOMY    . FINE  NEEDLE ASPIRATION  08/25/2018   Procedure: FINE NEEDLE ASPIRATION;  Surgeon: Josephine Igo, DO;  Location: [facility] ENDOSCOPY;  Service: Cardiopulmonary;;  . FLEXIBLE BRONCHOSCOPY  08/25/2018   Procedure: FLEXIBLE BRONCHOSCOPY;  Surgeon: Josephine Igo, DO;  Location: [facility] ENDOSCOPY;  Service: Cardiopulmonary;;  . IR GASTROSTOMY TUBE MOD SED  12/11/2018  . IR IMAGING GUIDED PORT INSERTION  09/29/2018  . LEFT HEART CATHETERIZATION WITH CORONARY ANGIOGRAM N/A 10/18/2011   Procedure: LEFT HEART CATHETERIZATION WITH CORONARY ANGIOGRAM;  Surgeon: Marykay Lex, MD;  Location: [facility] C[facility] LAB;  Service: Cardiovascular;  Laterality: N/A;  . LUMBAR LAMINECTOMY/DECOMPRESSION MICRODISCECTOMY  03/31/2012   Procedure: LUMBAR LAMINECTOMY/DECOMPRESSION MICRODISCECTOMY 1 LEVEL;  Surgeon: Temple Pacini, MD;  Location: [facility] NEURO ORS;  Service: Neurosurgery;  Laterality: Left;  . TRANSTHORACIC ECHOCARDIOGRAM  07/28/2011   EF 55-65%; LVH, grade 1 diastolic dysfunction;     Allergies  Allergen Reactions  . Iohexol Anaphylaxis    PT. TO BE PREMEDICATED PRIOR TO IV CONTRAST PER DR Eppie Gibson /MMS//12/15/15Desc: PT BECAME SOB AND CHEST TIGHTNESS AFTER CONTRAST INJECTION.  [name],RT-RCT., Onset Date: [id number]      Outpatient Medications Prior to Visit  Medication Sig Dispense Refill  . aspirin 81 MG tablet Take 1 tablet (81 mg total) daily by mouth. 30 tablet   . baclofen (LIORESAL) 10 MG tablet Take 1 tablet (10 mg total) by mouth 3 (three) times daily as needed for muscle spasms.    . benzonatate (TESSALON) 100 MG capsule Take 1 capsule (100 mg total) by mouth every 8 (eight) hours. (Patient taking differently: Take 100 mg by mouth 3 (three) times daily as needed for cough. ) 21 capsule 0  . Blood Glucose Monitoring Suppl (ACCU-CHEK AVIVA PLUS) w/Device KIT Use as dircted 1 kit 0  . Cholecalciferol (VITAMIN D) 2000 units tablet Take 1 tablet (2,000 Units total) by mouth daily. 30 tablet 1  . diclofenac sodium  (VOLTAREN) 1 % GEL Apply 2 g topically 4 (four) times daily. (Patient taking differently: Apply 2 g topically 4 (four) times daily as needed (pain). ) 1 Tube 0  . dronabinol (MARINOL) 5 MG capsule Take 1 capsule (5 mg total) by mouth 2 (two) times daily before a meal. 60 capsule 0  . fluticasone (FLONASE) 50 MCG/ACT nasal spray Place 2 sprays into both nostrils daily as needed for allergies.    Marland Kitchen glucose blood (ACCU-CHEK AVIVA) test strip Use as instructed 100 each 12  . hydrocortisone-pramoxine (ANALPRAM-HC) 2.5-1 % rectal cream Place 1 application rectally as needed. (Patient taking differently: Place 1 application rectally as needed for hemorrhoids or anal itching. )    . lidocaine-prilocaine (EMLA) cream Apply 1 application topically as needed. Apply 1 tsp on skin over port site one hour prior to chemotherapy. Cover with plastic wrap. Do not rub in medication. 30 g 0  . LORazepam (ATIVAN) 0.5 MG tablet 1 tab po q 4-6 hours prn or 1 tab po 30 minutes prior to radiation 30 tablet 0  .  magic mouthwash w/lidocaine SOLN Take 10 mLs by mouth 3 (three) times daily as needed for up to 20 days for mouth pain. 5 mL 0  . mirtazapine (REMERON) 30 MG tablet Take 1 tablet (30 mg total) by mouth at bedtime. 30 tablet 2  . nicotine (NICODERM CQ) 21 mg/24hr patch Place 1 patch (21 mg total) onto the skin daily. 28 patch 0  . nitroGLYCERIN (NITROSTAT) 0.4 MG SL tablet Place 1 tablet (0.4 mg total) under the tongue every 5 (five) minutes as needed for chest pain. (Patient taking differently: Place 0.4 mg under the tongue every 5 (five) minutes as needed for chest pain. Chest pain) 25 tablet 2  . ondansetron (ZOFRAN) 8 MG tablet Take 1 tablet (8 mg total) by mouth every 8 (eight) hours as needed for nausea or vomiting. 20 tablet 0  . oxyCODONE (ROXICODONE) 5 MG/5ML solution Take 10 mLs (10 mg total) by mouth every 4 (four) hours as needed for up to 7 days for severe pain. 300 mL 0  . potassium chloride 20 MEQ TBCR Take  10 mEq by mouth daily. 10 tablet 0  . prazosin (MINIPRESS) 1 MG capsule Take 1 capsule (1 mg total) by mouth at bedtime. For nightmares 30 capsule 2  . prochlorperazine (COMPAZINE) 10 MG tablet Take 1 tablet (10 mg total) by mouth every 6 (six) hours as needed for nausea or vomiting. 30 tablet 0  . promethazine (PHENERGAN) 25 MG tablet Take 1 tablet (25 mg total) by mouth every 6 (six) hours as needed for nausea or vomiting. 10 tablet 0  . ranolazine (RANEXA) 1000 MG SR tablet Take 1 tablet (1,000 mg total) by mouth 2 (two) times daily. 180 tablet 3  . sucralfate (CARAFATE) 1 g tablet Take 1 tablet by mouth 4 (four) times daily -  before meals and at bedtime.  0  . vitamin B-12 (CYANOCOBALAMIN) 500 MCG tablet Take 1 tablet (500 mcg total) by mouth daily. 30 tablet 1  . zolpidem (AMBIEN) 5 MG tablet Take 1 tablet (5 mg total) by mouth at bedtime. 30 tablet 2  . albuterol (PROAIR HFA) 108 (90 Base) MCG/ACT inhaler Inhale 1 puff into the lungs every 6 (six) hours as needed for wheezing or shortness of breath. 1 Inhaler 2  . amLODipine (NORVASC) 10 MG tablet Take 1 tablet (10 mg total) by mouth daily. 90 tablet 1  . clopidogrel (PLAVIX) 75 MG tablet Take 1 tablet (75 mg total) by mouth daily. YOU MAY RESTART 08/26/2018 90 tablet 1  . FLUoxetine (PROZAC) 40 MG capsule Take 1 capsule (40 mg total) by mouth daily. 90 capsule 1  . Fluticasone-Salmeterol (ADVAIR) 100-50 MCG/DOSE AEPB Inhale 1 puff into the lungs 2 (two) times daily. 1 each 3  . hydrALAZINE (APRESOLINE) 25 MG tablet Take 1 tablet (25 mg total) by mouth 2 (two) times daily. 180 tablet 1  . isosorbide mononitrate (IMDUR) 120 MG 24 hr tablet Take 1 tablet (120 mg total) by mouth daily. 90 tablet 1  . Nebivolol HCl 20 MG TABS Take 1 tablet (20 mg total) by mouth daily. 90 tablet 3  . pantoprazole (PROTONIX) 40 MG tablet Take 1 tablet (40 mg total) by mouth daily.    . rosuvastatin (CRESTOR) 20 MG tablet Take 1 tablet (20 mg total) by mouth daily.  90 tablet 1  . tiotropium (SPIRIVA HANDIHALER) 18 MCG inhalation capsule Place 1 capsule (18 mcg total) into inhaler and inhale daily. 90 capsule 1   No facility-administered medications  prior to visit.     ROS Review of Systems  Constitutional: Positive for appetite change, fatigue and unexpected weight change. Negative for activity change.  HENT: Negative for sinus pressure and sore throat.   Eyes: Negative for visual disturbance.  Respiratory: Negative for cough, chest tightness and shortness of breath.   Cardiovascular: Negative for chest pain and leg swelling.  Gastrointestinal: Negative for abdominal distention, abdominal pain, constipation and diarrhea.  Endocrine: Negative.   Genitourinary: Negative for dysuria.  Musculoskeletal: Positive for back pain. Negative for joint swelling and myalgias.  Skin: Negative for rash.  Allergic/Immunologic: Negative.   Neurological: Positive for weakness. Negative for light-headedness and numbness.  Psychiatric/Behavioral: Positive for dysphoric mood. Negative for suicidal ideas.    Objective:  BP 118/80   Pulse 92   Temp 98.4 F (36.9 C) (Oral)   Ht 5\' 9"  (1.753 m)   Wt 189 lb (85.7 kg)   SpO2 98%   BMI 27.91 kg/m   BP/Weight 12/19/2018 12/13/2018 12/02/2018  Systolic BP 118 109 -  Diastolic BP 80 81 -  Wt. (Lbs) 189 - 184.53  BMI 27.91 - -      Physical Exam Constitutional:      Appearance: He is well-developed.     Comments: Chronically ill looking with significant weight loss  Cardiovascular:     Rate and Rhythm: Normal rate.     Heart sounds: Normal heart sounds. No murmur.  Pulmonary:     Effort: Pulmonary effort is normal.     Breath sounds: Normal breath sounds. No wheezing or rales.  Chest:     Chest wall: No tenderness.  Abdominal:     General: Bowel sounds are normal. There is no distension.     Palpations: Abdomen is soft. There is no mass.     Tenderness: There is no abdominal tenderness.     Comments:  PEG tube in place  Musculoskeletal: Normal range of motion.  Neurological:     Mental Status: He is alert and oriented to person, place, and time.  Psychiatric:     Comments: Dysphoric mood     Lab Results  Component Value Date   HGBA1C 6.2 12/19/2018    Assessment & Plan:   1. Diabetes mellitus due to underlying condition with hyperosmolarity without coma, without long-term current use of insulin (HCC) Controlled with A1c of 6.2 - POCT glucose (manual entry) - POCT glycosylated hemoglobin (Hb A1C)  2. Essential hypertension Controlled Low-sodium, DASH diet - amLODipine (NORVASC) 10 MG tablet; Take 1 tablet (10 mg total) by mouth daily.  Dispense: 90 tablet; Refill: 1 - Nebivolol HCl 20 MG TABS; Take 1 tablet (20 mg total) by mouth daily.  Dispense: 90 tablet; Refill: 1  3. Presence of stent in left circumflex coronary artery Risk factor modification - hydrALAZINE (APRESOLINE) 25 MG tablet; Take 1 tablet (25 mg total) by mouth 2 (two) times daily.  Dispense: 180 tablet; Refill: 1 - isosorbide mononitrate (IMDUR) 120 MG 24 hr tablet; Take 1 tablet (120 mg total) by mouth daily.  Dispense: 90 tablet; Refill: 1 - clopidogrel (PLAVIX) 75 MG tablet; Take 1 tablet (75 mg total) by mouth daily. YOU MAY RESTART 08/26/2018  Dispense: 90 tablet; Refill: 1  4. Moderate episode of recurrent major depressive disorder (HCC) Recent cancer diagnosis in the setting of underlying chronic medical conditions have contributed to his dysphoric mood He has a support of his wife and family - FLUoxetine (PROZAC) 40 MG capsule; Take 1 capsule (40 mg  total) by mouth daily.  Dispense: 90 capsule; Refill: 1  5. COPD with asthma (HCC) No recent exacerbation - Fluticasone-Salmeterol (ADVAIR) 100-50 MCG/DOSE AEPB; Inhale 1 puff into the lungs 2 (two) times daily.  Dispense: 1 each; Refill: 3 - tiotropium (SPIRIVA HANDIHALER) 18 MCG inhalation capsule; Place 1 capsule (18 mcg total) into inhaler and inhale  daily.  Dispense: 90 capsule; Refill: 1  6. Gastroesophageal reflux disease without esophagitis Controlled - pantoprazole (PROTONIX) 40 MG tablet; Take 1 tablet (40 mg total) by mouth daily.  Dispense: 90 tablet; Refill: 1  7. 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 Shirline Frees  8. Radiation esophagitis Status post PEG tube   Meds ordered this encounter  Medications  . albuterol (PROAIR HFA) 108 (90 Base) MCG/ACT inhaler    Sig: Inhale 1 puff into the lungs every 6 (six) hours as needed for wheezing or shortness of breath.    Dispense:  1 Inhaler    Refill:  6  . amLODipine (NORVASC) 10 MG tablet    Sig: Take 1 tablet (10 mg total) by mouth daily.    Dispense:  90 tablet    Refill:  1  . DISCONTD: clopidogrel (PLAVIX) 75 MG tablet    Sig: Take 1 tablet (75 mg total) by mouth daily. YOU MAY RESTART 08/26/2018    Dispense:  90 tablet    Refill:  1  . FLUoxetine (PROZAC) 40 MG capsule    Sig: Take 1 capsule (40 mg total) by mouth daily.    Dispense:  90 capsule    Refill:  1    Discontinue previous dose  . hydrALAZINE (APRESOLINE) 25 MG tablet    Sig: Take 1 tablet (25 mg total) by mouth 2 (two) times daily.    Dispense:  180 tablet    Refill:  1  . Fluticasone-Salmeterol (ADVAIR) 100-50 MCG/DOSE AEPB    Sig: Inhale 1 puff into the lungs 2 (two) times daily.    Dispense:  1 each    Refill:  3    Discontinue Asmanex  . isosorbide mononitrate (IMDUR) 120 MG 24 hr tablet    Sig: Take 1 tablet (120 mg total) by mouth daily.    Dispense:  90 tablet    Refill:  1    Discontinue previous dose  . Nebivolol HCl 20 MG TABS    Sig: Take 1 tablet (20 mg total) by mouth daily.    Dispense:  90 tablet    Refill:  1  . pantoprazole (PROTONIX) 40 MG tablet    Sig: Take 1 tablet (40 mg total) by mouth daily.    Dispense:  90 tablet    Refill:  1    Discontinue daily dosing  .  rosuvastatin (CRESTOR) 20 MG tablet    Sig: Take 1 tablet (20 mg total) by mouth daily.    Dispense:  90 tablet    Refill:  1    Discontinue Lipitor  . tiotropium (SPIRIVA HANDIHALER) 18 MCG inhalation capsule    Sig: Place 1 capsule (18 mcg total) into inhaler and inhale daily.    Dispense:  90 capsule    Refill:  1  . clopidogrel (PLAVIX) 75 MG tablet    Sig: Take 1 tablet (75 mg total) by mouth daily. YOU MAY RESTART 08/26/2018    Dispense:  90 tablet    Refill:  1    Follow-up: Return in about  3 months (around 03/20/2019) for follow up of chronic medical conditions.   [name] MD

## 2018-12-20 ENCOUNTER — Telehealth: Payer: Self-pay | Admitting: *Deleted

## 2018-12-20 DIAGNOSIS — R627 Adult failure to thrive: Secondary | ICD-10-CM | POA: Diagnosis not present

## 2018-12-20 DIAGNOSIS — I129 Hypertensive chronic kidney disease with stage 1 through stage 4 chronic kidney disease, or unspecified chronic kidney disease: Secondary | ICD-10-CM | POA: Diagnosis not present

## 2018-12-20 DIAGNOSIS — I2510 Atherosclerotic heart disease of native coronary artery without angina pectoris: Secondary | ICD-10-CM | POA: Diagnosis not present

## 2018-12-20 DIAGNOSIS — Z431 Encounter for attention to gastrostomy: Secondary | ICD-10-CM | POA: Diagnosis not present

## 2018-12-20 DIAGNOSIS — K208 Other esophagitis: Secondary | ICD-10-CM | POA: Diagnosis not present

## 2018-12-20 DIAGNOSIS — R1310 Dysphagia, unspecified: Secondary | ICD-10-CM | POA: Diagnosis not present

## 2018-12-20 NOTE — Telephone Encounter (Signed)
Pt called w/request for pain medication. Will forward to MD for review.

## 2018-12-21 ENCOUNTER — Other Ambulatory Visit: Payer: Self-pay | Admitting: Internal Medicine

## 2018-12-21 MED ORDER — HYDROCODONE-ACETAMINOPHEN 5-325 MG PO TABS: 1.0000 | ORAL_TABLET | Freq: Four times a day (QID) | ORAL | 0 refills | Status: DC | PRN

## 2018-12-21 MED FILL — HYDROCODON-APAP 5-325: 5-325 | 7 days supply | Qty: 30 | Fill #0

## 2018-12-21 NOTE — Telephone Encounter (Signed)
I do not see it on his medication list to reorder. What is he taking?

## 2018-12-22 ENCOUNTER — Other Ambulatory Visit (HOSPITAL_COMMUNITY): Payer: Self-pay | Admitting: Interventional Radiology

## 2018-12-22 ENCOUNTER — Ambulatory Visit (HOSPITAL_COMMUNITY)
Admission: RE | Admit: 2018-12-22 | Discharge: 2018-12-22 | Disposition: A | Discharge status: Home / Self Care | Payer: Medicare Other | Source: Ambulatory Visit | Attending: Interventional Radiology | Admitting: Interventional Radiology

## 2018-12-22 ENCOUNTER — Encounter (HOSPITAL_COMMUNITY): Payer: Self-pay

## 2018-12-22 DIAGNOSIS — Z431 Encounter for attention to gastrostomy: Secondary | ICD-10-CM

## 2018-12-25 ENCOUNTER — Inpatient Hospital Stay (HOSPITAL_COMMUNITY)
Admission: EM | Admit: 2018-12-25 | Discharge: 2018-12-28 | DRG: 682 | Disposition: A | Discharge status: Home Health | Payer: Medicare Other | Attending: Internal Medicine | Admitting: Internal Medicine

## 2018-12-25 ENCOUNTER — Other Ambulatory Visit: Payer: Self-pay

## 2018-12-25 ENCOUNTER — Encounter: Payer: Self-pay | Admitting: Internal Medicine

## 2018-12-25 ENCOUNTER — Inpatient Hospital Stay: Payer: Medicare Other | Admitting: Nutrition

## 2018-12-25 ENCOUNTER — Inpatient Hospital Stay: Payer: Medicare Other

## 2018-12-25 ENCOUNTER — Encounter: Payer: Self-pay | Admitting: Nutrition

## 2018-12-25 ENCOUNTER — Inpatient Hospital Stay: Payer: Medicare Other | Attending: Internal Medicine | Admitting: Internal Medicine

## 2018-12-25 ENCOUNTER — Emergency Department (HOSPITAL_COMMUNITY): Payer: Medicare Other

## 2018-12-25 ENCOUNTER — Encounter (HOSPITAL_COMMUNITY): Payer: Self-pay | Admitting: Emergency Medicine

## 2018-12-25 VITALS — BP 111/77 | HR 89 | Temp 99.4°F | Resp 20 | Ht 69.0 in | Wt 178.1 lb

## 2018-12-25 DIAGNOSIS — Z8249 Family history of ischemic heart disease and other diseases of the circulatory system: Secondary | ICD-10-CM

## 2018-12-25 DIAGNOSIS — Z9861 Coronary angioplasty status: Secondary | ICD-10-CM

## 2018-12-25 DIAGNOSIS — K210 Gastro-esophageal reflux disease with esophagitis: Secondary | ICD-10-CM | POA: Diagnosis present

## 2018-12-25 DIAGNOSIS — N179 Acute kidney failure, unspecified: Secondary | ICD-10-CM | POA: Diagnosis not present

## 2018-12-25 DIAGNOSIS — Z79899 Other long term (current) drug therapy: Secondary | ICD-10-CM

## 2018-12-25 DIAGNOSIS — R1310 Dysphagia, unspecified: Secondary | ICD-10-CM

## 2018-12-25 DIAGNOSIS — Z91041 Radiographic dye allergy status: Secondary | ICD-10-CM

## 2018-12-25 DIAGNOSIS — R55 Syncope and collapse: Secondary | ICD-10-CM | POA: Diagnosis present

## 2018-12-25 DIAGNOSIS — W1839XA Other fall on same level, initial encounter: Secondary | ICD-10-CM | POA: Diagnosis present

## 2018-12-25 DIAGNOSIS — Z6826 Body mass index (BMI) 26.0-26.9, adult: Secondary | ICD-10-CM

## 2018-12-25 DIAGNOSIS — F329 Major depressive disorder, single episode, unspecified: Secondary | ICD-10-CM | POA: Diagnosis present

## 2018-12-25 DIAGNOSIS — G8929 Other chronic pain: Secondary | ICD-10-CM | POA: Diagnosis present

## 2018-12-25 DIAGNOSIS — M79605 Pain in left leg: Secondary | ICD-10-CM | POA: Diagnosis present

## 2018-12-25 DIAGNOSIS — J4489 Other specified chronic obstructive pulmonary disease: Secondary | ICD-10-CM | POA: Diagnosis present

## 2018-12-25 DIAGNOSIS — Z931 Gastrostomy status: Secondary | ICD-10-CM

## 2018-12-25 DIAGNOSIS — E119 Type 2 diabetes mellitus without complications: Secondary | ICD-10-CM

## 2018-12-25 DIAGNOSIS — I10 Essential (primary) hypertension: Secondary | ICD-10-CM

## 2018-12-25 DIAGNOSIS — F17200 Nicotine dependence, unspecified, uncomplicated: Secondary | ICD-10-CM

## 2018-12-25 DIAGNOSIS — R531 Weakness: Secondary | ICD-10-CM | POA: Diagnosis not present

## 2018-12-25 DIAGNOSIS — E860 Dehydration: Secondary | ICD-10-CM | POA: Diagnosis present

## 2018-12-25 DIAGNOSIS — R5383 Other fatigue: Secondary | ICD-10-CM

## 2018-12-25 DIAGNOSIS — E43 Unspecified severe protein-calorie malnutrition: Secondary | ICD-10-CM

## 2018-12-25 DIAGNOSIS — G4730 Sleep apnea, unspecified: Secondary | ICD-10-CM | POA: Diagnosis present

## 2018-12-25 DIAGNOSIS — C3411 Malignant neoplasm of upper lobe, right bronchus or lung: Secondary | ICD-10-CM

## 2018-12-25 DIAGNOSIS — D631 Anemia in chronic kidney disease: Secondary | ICD-10-CM | POA: Diagnosis present

## 2018-12-25 DIAGNOSIS — Z833 Family history of diabetes mellitus: Secondary | ICD-10-CM

## 2018-12-25 DIAGNOSIS — Z87891 Personal history of nicotine dependence: Secondary | ICD-10-CM

## 2018-12-25 DIAGNOSIS — E11649 Type 2 diabetes mellitus with hypoglycemia without coma: Secondary | ICD-10-CM | POA: Diagnosis not present

## 2018-12-25 DIAGNOSIS — F419 Anxiety disorder, unspecified: Secondary | ICD-10-CM | POA: Diagnosis present

## 2018-12-25 DIAGNOSIS — Z7982 Long term (current) use of aspirin: Secondary | ICD-10-CM

## 2018-12-25 DIAGNOSIS — Z955 Presence of coronary angioplasty implant and graft: Secondary | ICD-10-CM

## 2018-12-25 DIAGNOSIS — J449 Chronic obstructive pulmonary disease, unspecified: Secondary | ICD-10-CM | POA: Diagnosis present

## 2018-12-25 DIAGNOSIS — I959 Hypotension, unspecified: Secondary | ICD-10-CM | POA: Diagnosis present

## 2018-12-25 DIAGNOSIS — N183 Chronic kidney disease, stage 3 unspecified: Secondary | ICD-10-CM | POA: Diagnosis present

## 2018-12-25 DIAGNOSIS — I2510 Atherosclerotic heart disease of native coronary artery without angina pectoris: Secondary | ICD-10-CM

## 2018-12-25 DIAGNOSIS — Y92512 Supermarket, store or market as the place of occurrence of the external cause: Secondary | ICD-10-CM

## 2018-12-25 DIAGNOSIS — E1122 Type 2 diabetes mellitus with diabetic chronic kidney disease: Secondary | ICD-10-CM | POA: Diagnosis present

## 2018-12-25 DIAGNOSIS — R51 Headache: Secondary | ICD-10-CM | POA: Diagnosis not present

## 2018-12-25 DIAGNOSIS — D649 Anemia, unspecified: Secondary | ICD-10-CM | POA: Diagnosis not present

## 2018-12-25 DIAGNOSIS — R634 Abnormal weight loss: Secondary | ICD-10-CM

## 2018-12-25 DIAGNOSIS — Z7902 Long term (current) use of antithrombotics/antiplatelets: Secondary | ICD-10-CM

## 2018-12-25 DIAGNOSIS — E785 Hyperlipidemia, unspecified: Secondary | ICD-10-CM | POA: Diagnosis present

## 2018-12-25 DIAGNOSIS — I5189 Other ill-defined heart diseases: Secondary | ICD-10-CM

## 2018-12-25 DIAGNOSIS — M79604 Pain in right leg: Secondary | ICD-10-CM | POA: Diagnosis present

## 2018-12-25 DIAGNOSIS — I129 Hypertensive chronic kidney disease with stage 1 through stage 4 chronic kidney disease, or unspecified chronic kidney disease: Secondary | ICD-10-CM | POA: Diagnosis present

## 2018-12-25 DIAGNOSIS — M545 Low back pain: Secondary | ICD-10-CM | POA: Diagnosis present

## 2018-12-25 DIAGNOSIS — E440 Moderate protein-calorie malnutrition: Secondary | ICD-10-CM

## 2018-12-25 LAB — CMP (CANCER CENTER ONLY)
ALT: 10 U/L (ref 0–44)
AST: 13 U/L — ABNORMAL LOW (ref 15–41)
Albumin: 3.4 g/dL — ABNORMAL LOW (ref 3.5–5.0)
Alkaline Phosphatase: 67 U/L (ref 38–126)
Anion gap: 12 (ref 5–15)
BUN: 29 mg/dL — ABNORMAL HIGH (ref 6–20)
CO2: 28 mmol/L (ref 22–32)
Calcium: 9.6 mg/dL (ref 8.9–10.3)
Chloride: 103 mmol/L (ref 98–111)
Creatinine: 2.6 mg/dL — ABNORMAL HIGH (ref 0.61–1.24)
GFR, Est AFR Am: 31 mL/min — ABNORMAL LOW (ref >60)
GFR, Estimated: 27 mL/min — ABNORMAL LOW (ref >60)
Glucose, Bld: 87 mg/dL (ref 70–99)
Potassium: 4.1 mmol/L (ref 3.5–5.1)
Sodium: 143 mmol/L (ref 135–145)
Total Bilirubin: 0.6 mg/dL (ref 0.3–1.2)
Total Protein: 7.3 g/dL (ref 6.5–8.1)

## 2018-12-25 LAB — HEPATIC FUNCTION PANEL
ALT: 11 U/L (ref 0–44)
AST: 14 U/L — ABNORMAL LOW (ref 15–41)
Albumin: 3.2 g/dL — ABNORMAL LOW (ref 3.5–5.0)
Alkaline Phosphatase: 52 U/L (ref 38–126)
Bilirubin, Direct: 0.1 mg/dL (ref 0.0–0.2)
Indirect Bilirubin: 0.3 mg/dL (ref 0.3–0.9)
Total Bilirubin: 0.4 mg/dL (ref 0.3–1.2)
Total Protein: 6.8 g/dL (ref 6.5–8.1)

## 2018-12-25 LAB — CBC WITH DIFFERENTIAL (CANCER CENTER ONLY)
ABS IMMATURE GRANULOCYTES: 0.05 10*3/uL (ref 0.00–0.07)
Basophils Absolute: 0 10*3/uL (ref 0.0–0.1)
Basophils Relative: 0 %
Eosinophils Absolute: 0 10*3/uL (ref 0.0–0.5)
Eosinophils Relative: 0 %
HCT: 28.3 % — ABNORMAL LOW (ref 39.0–52.0)
HEMOGLOBIN: 9 g/dL — AB (ref 13.0–17.0)
Immature Granulocytes: 1 %
LYMPHS PCT: 7 %
Lymphs Abs: 0.5 10*3/uL — ABNORMAL LOW (ref 0.7–4.0)
MCH: 28.6 pg (ref 26.0–34.0)
MCHC: 31.8 g/dL (ref 30.0–36.0)
MCV: 89.8 fL (ref 80.0–100.0)
Monocytes Absolute: 1.1 10*3/uL — ABNORMAL HIGH (ref 0.1–1.0)
Monocytes Relative: 16 %
NEUTROS ABS: 5.2 10*3/uL (ref 1.7–7.7)
Neutrophils Relative %: 76 %
Platelet Count: 202 10*3/uL (ref 150–400)
RBC: 3.15 MIL/uL — ABNORMAL LOW (ref 4.22–5.81)
RDW: 15.4 % (ref 11.5–15.5)
WBC Count: 6.8 10*3/uL (ref 4.0–10.5)
nRBC: 0 % (ref 0.0–0.2)

## 2018-12-25 LAB — BASIC METABOLIC PANEL
Anion gap: 10 (ref 5–15)
BUN: 33 mg/dL — ABNORMAL HIGH (ref 6–20)
CALCIUM: 8.9 mg/dL (ref 8.9–10.3)
CO2: 27 mmol/L (ref 22–32)
Chloride: 101 mmol/L (ref 98–111)
Creatinine, Ser: 2.88 mg/dL — ABNORMAL HIGH (ref 0.61–1.24)
GFR calc Af Amer: 28 mL/min — ABNORMAL LOW (ref >60)
GFR calc non Af Amer: 24 mL/min — ABNORMAL LOW (ref >60)
Glucose, Bld: 135 mg/dL — ABNORMAL HIGH (ref 70–99)
Potassium: 3.9 mmol/L (ref 3.5–5.1)
Sodium: 138 mmol/L (ref 135–145)

## 2018-12-25 LAB — TROPONIN I

## 2018-12-25 LAB — PREPARE RBC (CROSSMATCH)

## 2018-12-25 LAB — MAGNESIUM: Magnesium: 1.8 mg/dL (ref 1.7–2.4)

## 2018-12-25 LAB — POC OCCULT BLOOD, ED: FECAL OCCULT BLD: NEGATIVE

## 2018-12-25 LAB — CBC
HCT: 24.7 % — ABNORMAL LOW (ref 39.0–52.0)
Hemoglobin: 7.7 g/dL — ABNORMAL LOW (ref 13.0–17.0)
MCH: 29.6 pg (ref 26.0–34.0)
MCHC: 31.2 g/dL (ref 30.0–36.0)
MCV: 95 fL (ref 80.0–100.0)
PLATELETS: 195 10*3/uL (ref 150–400)
RBC: 2.6 MIL/uL — ABNORMAL LOW (ref 4.22–5.81)
RDW: 15.7 % — ABNORMAL HIGH (ref 11.5–15.5)
WBC: 7.9 10*3/uL (ref 4.0–10.5)
nRBC: 0 % (ref 0.0–0.2)

## 2018-12-25 LAB — CBG MONITORING, ED: Glucose-Capillary: 125 mg/dL — ABNORMAL HIGH (ref 70–99)

## 2018-12-25 MED ORDER — SODIUM CHLORIDE 0.9 % IV BOLUS
1000.0000 mL | Freq: Once | INTRAVENOUS | Status: AC
  Administered 2018-12-25: 1000 mL via INTRAVENOUS

## 2018-12-25 MED ORDER — FENTANYL CITRATE (PF) 100 MCG/2ML IJ SOLN
50.0000 ug | Freq: Once | INTRAMUSCULAR | Status: AC
  Administered 2018-12-25: 50 ug via INTRAVENOUS
  Filled 2018-12-25: qty 2

## 2018-12-25 MED ORDER — SODIUM CHLORIDE 0.9 % IV SOLN: 10.0000 mL/h | Freq: Once | INTRAVENOUS | Status: DC

## 2018-12-25 NOTE — Progress Notes (Signed)
[facility] [phone]   [phone]  OFFICE PROGRESS NOTE  [name], MD [place] [address]  DIAGNOSIS: Limited stage (T3, N3, M0) small cell lung cancer presented with right paratracheal mass in addition to right suprahilar mass and lymphadenopathy as well as right cervical lymph node diagnosed in October 2019  PRIOR THERAPY:  systemic chemotherapy with cisplatin 80 mg/M2 on day 1 and etoposide 100 mg/M2 on days 1, 2 and 3 every 3 weeks.  Status post 3 cycles.  This will be concurrent with radiotherapy with the start of cycle #2.  Starting from cycle #4 his dose of cisplatin would be 60 mg/M2 and etoposide 90 mg/M2.  Last dose was given November 23, 2018.  CURRENT THERAPY: Observation.  INTERVAL HISTORY: Andrew West 53 y.o. male returns to the clinic today for follow-up visit accompanied by his wife and mother.  The patient is feeling fine today except for fatigue and significant weight loss.  He was admitted to [facility] long hospital for almost 2 weeks after the last cycle of his treatment.  He had significant pancytopenia after every cycle of his treatment.  The patient completed concurrent radiotherapy.  He has dysphasia and odynophagia.  He had a PEG tube placement for nutrition.  He denied having any chest pain, shortness of breath, cough or hemoptysis.  He denied having any fever or chills.  He has no nausea, vomiting, diarrhea or constipation.  He has no current headache or visual changes.  The patient had repeat CT scan of the chest, abdomen and pelvis during his hospitalization.  He is here for evaluation before resuming his chemotherapy.  MEDICAL HISTORY: Past Medical History:  Diagnosis Date  . Anxiety   . CAD (coronary artery disease)    a. s/p multiple PCIs with last cath 11/2016 with severe multivessel CAD, s/p PCTA to LCx but unable to pass stent  . Chronic leg pain    bilateral  . Chronic lower back pain    . COPD (chronic obstructive pulmonary disease) ([place])   . Depression   . GERD (gastroesophageal reflux disease)    Takes Dexilant  . HLD (hyperlipidemia)   . Hypertension   . Rhabdomyolysis    h/o, r/t statins  . Sleep apnea    "can't tolerate mask" (12/16/2016)  . Type II diabetes mellitus ([facility])     ALLERGIES:  is allergic to iohexol.  MEDICATIONS:  Current Outpatient Medications  Medication Sig Dispense Refill  . albuterol (PROAIR HFA) 108 (90 Base) MCG/ACT inhaler Inhale 1 puff into the lungs every 6 (six) hours as needed for wheezing or shortness of breath. 1 Inhaler 6  . amLODipine (NORVASC) 10 MG tablet Take 1 tablet (10 mg total) by mouth daily. 90 tablet 1  . aspirin 81 MG tablet Take 1 tablet (81 mg total) daily by mouth. 30 tablet   . baclofen (LIORESAL) 10 MG tablet Take 1 tablet (10 mg total) by mouth 3 (three) times daily as needed for muscle spasms.    . benzonatate (TESSALON) 100 MG capsule Take 1 capsule (100 mg total) by mouth every 8 (eight) hours. (Patient taking differently: Take 100 mg by mouth 3 (three) times daily as needed for cough. ) 21 capsule 0  . Blood Glucose Monitoring Suppl (ACCU-CHEK AVIVA PLUS) w/Device KIT Use as dircted 1 kit 0  . Cholecalciferol (VITAMIN D) 2000 units tablet Take 1 tablet (2,000 Units total) by mouth daily. 30 tablet  1  . clopidogrel (PLAVIX) 75 MG tablet Take 1 tablet (75 mg total) by mouth daily. YOU MAY RESTART 08/26/2018 90 tablet 1  . diclofenac sodium (VOLTAREN) 1 % GEL Apply 2 g topically 4 (four) times daily. (Patient taking differently: Apply 2 g topically 4 (four) times daily as needed (pain). ) 1 Tube 0  . dronabinol (MARINOL) 5 MG capsule Take 1 capsule (5 mg total) by mouth 2 (two) times daily before a meal. 60 capsule 0  . FLUoxetine (PROZAC) 40 MG capsule Take 1 capsule (40 mg total) by mouth daily. 90 capsule 1  . fluticasone (FLONASE) 50 MCG/ACT nasal spray Place 2 sprays into both nostrils daily as needed for  allergies.    . Fluticasone-Salmeterol (ADVAIR) 100-50 MCG/DOSE AEPB Inhale 1 puff into the lungs 2 (two) times daily. 1 each 3  . glucose blood (ACCU-CHEK AVIVA) test strip Use as instructed 100 each 12  . hydrALAZINE (APRESOLINE) 25 MG tablet Take 1 tablet (25 mg total) by mouth 2 (two) times daily. 180 tablet 1  . HYDROcodone-acetaminophen (NORCO) 5-325 MG tablet Take 1 tablet by mouth every 6 (six) hours as needed for moderate pain. 30 tablet 0  . hydrocortisone-pramoxine (ANALPRAM-HC) 2.5-1 % rectal cream Place 1 application rectally as needed. (Patient taking differently: Place 1 application rectally as needed for hemorrhoids or anal itching. )    . isosorbide mononitrate (IMDUR) 120 MG 24 hr tablet Take 1 tablet (120 mg total) by mouth daily. 90 tablet 1  . lidocaine-prilocaine (EMLA) cream Apply 1 application topically as needed. Apply 1 tsp on skin over port site one hour prior to chemotherapy. Cover with plastic wrap. Do not rub in medication. 30 g 0  . LORazepam (ATIVAN) 0.5 MG tablet 1 tab po q 4-6 hours prn or 1 tab po 30 minutes prior to radiation 30 tablet 0  . magic mouthwash w/lidocaine SOLN Take 10 mLs by mouth 3 (three) times daily as needed for up to 20 days for mouth pain. 5 mL 0  . mirtazapine (REMERON) 30 MG tablet Take 1 tablet (30 mg total) by mouth at bedtime. 30 tablet 2  . Nebivolol HCl 20 MG TABS Take 1 tablet (20 mg total) by mouth daily. 90 tablet 1  . nicotine (NICODERM CQ) 21 mg/24hr patch Place 1 patch (21 mg total) onto the skin daily. 28 patch 0  . nitroGLYCERIN (NITROSTAT) 0.4 MG SL tablet Place 1 tablet (0.4 mg total) under the tongue every 5 (five) minutes as needed for chest pain. (Patient taking differently: Place 0.4 mg under the tongue every 5 (five) minutes as needed for chest pain. Chest pain) 25 tablet 2  . ondansetron (ZOFRAN) 8 MG tablet Take 1 tablet (8 mg total) by mouth every 8 (eight) hours as needed for nausea or vomiting. 20 tablet 0  .  pantoprazole (PROTONIX) 40 MG tablet Take 1 tablet (40 mg total) by mouth daily. 90 tablet 1  . potassium chloride 20 MEQ TBCR Take 10 mEq by mouth daily. 10 tablet 0  . prazosin (MINIPRESS) 1 MG capsule Take 1 capsule (1 mg total) by mouth at bedtime. For nightmares 30 capsule 2  . prochlorperazine (COMPAZINE) 10 MG tablet Take 1 tablet (10 mg total) by mouth every 6 (six) hours as needed for nausea or vomiting. 30 tablet 0  . promethazine (PHENERGAN) 25 MG tablet Take 1 tablet (25 mg total) by mouth every 6 (six) hours as needed for nausea or vomiting. 10 tablet 0  .  ranolazine (RANEXA) 1000 MG SR tablet Take 1 tablet (1,000 mg total) by mouth 2 (two) times daily. 180 tablet 3  . rosuvastatin (CRESTOR) 20 MG tablet Take 1 tablet (20 mg total) by mouth daily. 90 tablet 1  . sucralfate (CARAFATE) 1 g tablet Take 1 tablet by mouth 4 (four) times daily -  before meals and at bedtime.  0  . tiotropium (SPIRIVA HANDIHALER) 18 MCG inhalation capsule Place 1 capsule (18 mcg total) into inhaler and inhale daily. 90 capsule 1  . vitamin B-12 (CYANOCOBALAMIN) 500 MCG tablet Take 1 tablet (500 mcg total) by mouth daily. 30 tablet 1  . zolpidem (AMBIEN) 5 MG tablet Take 1 tablet (5 mg total) by mouth at bedtime. 30 tablet 2   No current facility-administered medications for this visit.     SURGICAL HISTORY:  Past Surgical History:  Procedure Laterality Date  . BACK SURGERY    . BRONCHIAL BIOPSY  08/25/2018   Procedure: BRONCHIAL BIOPSIES;  Surgeon: Icard, Bradley L, DO;  Location: [facility];  Service: Cardiopulmonary;;  . CARDIAC CATHETERIZATION N/A 09/25/2015   Procedure: Left Heart Cath and Coronary Angiography;  Surgeon: David W Harding, MD;  Location: [facility];  Service: Cardiovascular;  Laterality: N/A;  . CARDIAC CATHETERIZATION N/A 12/16/2016   Procedure: Left Heart Cath and Coronary Angiography;  Surgeon: David W Harding, MD;  Location: [facility];  Service: Cardiovascular;   Laterality: N/A;  . CARDIAC CATHETERIZATION N/A 12/16/2016   Procedure: Coronary Balloon Angioplasty;  Surgeon: David W Harding, MD;  Location: [facility];  Service: Cardiovascular;  Laterality: N/A;  . COLONOSCOPY W/ POLYPECTOMY    . CORONARY ANGIOPLASTY  09/25/2015   mid cir & om  . CORONARY ANGIOPLASTY WITH STENT PLACEMENT  10/09/2001   PTCA & stenting of mid AV circumflex; 2.5x13mm Pixel stent  . CORONARY ANGIOPLASTY WITH STENT PLACEMENT  12/13/2001   PCI with stent to mid L circumflex, 95% stenosis to 0% residual  . CORONARY ANGIOPLASTY WITH STENT PLACEMENT  10/10/2003   PCI to mid AV circumflex; LAD 30% disease; RCA 100% occluded prox.  . CORONARY ANGIOPLASTY WITH STENT PLACEMENT  09/01/2011   PCI with stenting with bare metal stent to mid AV groove circumflex and PDA  . CORONARY ANGIOPLASTY WITH STENT PLACEMENT  10/17/2011   cutting balloon angioplasty of ostial lateral OM1 branch and bifurcation AV groove circumflex OM junction; stenosis reduced to 0%  . ENDOBRONCHIAL ULTRASOUND Bilateral 08/25/2018   Procedure: ENDOBRONCHIAL ULTRASOUND;  Surgeon: Icard, Bradley L, DO;  Location: [facility];  Service: Cardiopulmonary;  Laterality: Bilateral;  . ESOPHAGOGASTRODUODENOSCOPY (EGD) WITH PROPOFOL N/A 12/08/2018   Procedure: ESOPHAGOGASTRODUODENOSCOPY (EGD) WITH PROPOFOL;  Surgeon: Jacobs, Daniel P, MD;  Location: [facility];  Service: Endoscopy;  Laterality: N/A;  . EXCISIONAL HEMORRHOIDECTOMY    . FINE NEEDLE ASPIRATION  08/25/2018   Procedure: FINE NEEDLE ASPIRATION;  Surgeon: Icard, Bradley L, DO;  Location: [facility];  Service: Cardiopulmonary;;  . FLEXIBLE BRONCHOSCOPY  08/25/2018   Procedure: FLEXIBLE BRONCHOSCOPY;  Surgeon: Icard, Bradley L, DO;  Location: [facility];  Service: Cardiopulmonary;;  . IR GASTROSTOMY TUBE MOD SED  12/11/2018  . IR IMAGING GUIDED PORT INSERTION  09/29/2018  . LEFT HEART CATHETERIZATION WITH CORONARY ANGIOGRAM N/A 10/18/2011   Procedure: LEFT  HEART CATHETERIZATION WITH CORONARY ANGIOGRAM;  Surgeon: David W Harding, MD;  Location: [facility];  Service: Cardiovascular;  Laterality: N/A;  . LUMBAR LAMINECTOMY/DECOMPRESSION MICRODISCECTOMY  03/31/2012   Procedure: LUMBAR LAMINECTOMY/DECOMPRESSION   MICRODISCECTOMY 1 LEVEL;  Surgeon: Henry A Pool, MD;  Location: [facility];  Service: Neurosurgery;  Laterality: Left;  . TRANSTHORACIC ECHOCARDIOGRAM  07/28/2011   EF 55-65%; LVH, grade 1 diastolic dysfunction;     REVIEW OF SYSTEMS:  Constitutional: positive for fatigue and weight loss Eyes: negative Ears, nose, mouth, throat, and face: negative Respiratory: negative Cardiovascular: negative Gastrointestinal: positive for odynophagia Genitourinary:negative Integument/breast: negative Hematologic/lymphatic: negative Musculoskeletal:negative Neurological: negative Behavioral/Psych: negative Endocrine: negative Allergic/Immunologic: negative   PHYSICAL EXAMINATION: General appearance: alert, cooperative, fatigued and no distress Head: Normocephalic, without obvious abnormality, atraumatic Neck: no adenopathy, no JVD, supple, symmetrical, trachea midline and thyroid not enlarged, symmetric, no tenderness/mass/nodules Lymph nodes: Cervical, supraclavicular, and axillary nodes normal. Resp: clear to auscultation bilaterally Back: symmetric, no curvature. ROM normal. No CVA tenderness. Cardio: regular rate and rhythm, S1, S2 normal, no murmur, click, rub or gallop GI: soft, non-tender; bowel sounds normal; no masses,  no organomegaly Extremities: extremities normal, atraumatic, no cyanosis or edema Neurologic: Alert and oriented X 3, normal strength and tone. Normal symmetric reflexes. Normal coordination and gait  ECOG PERFORMANCE STATUS: 1 - Symptomatic but completely ambulatory  Blood pressure 111/77, pulse 89, temperature 99.4 F (37.4 C), temperature source Oral, resp. rate 20, height 5' 9" (1.753 m), weight 178 lb 1.6 oz (80.8  kg), SpO2 98 %.  LABORATORY DATA: Lab Results  Component Value Date   WBC 6.8 12/25/2018   HGB 9.0 (L) 12/25/2018   HCT 28.3 (L) 12/25/2018   MCV 89.8 12/25/2018   PLT 202 12/25/2018      Chemistry      Component Value Date/Time   NA 138 12/12/2018 0626   NA 144 07/25/2018 0949   K 3.8 12/12/2018 0626   CL 106 12/12/2018 0626   CO2 24 12/12/2018 0626   BUN 31 (H) 12/12/2018 0626   BUN 10 07/25/2018 0949   CREATININE 1.45 (H) 12/12/2018 0626   CREATININE 1.94 (H) 11/27/2018 0852   CREATININE 0.96 12/13/2016 1151      Component Value Date/Time   CALCIUM 8.6 (L) 12/12/2018 0626   ALKPHOS 54 12/08/2018 0647   AST 13 (L) 12/08/2018 0647   AST 14 (L) 11/27/2018 0852   ALT 11 12/08/2018 0647   ALT 12 11/27/2018 0852   BILITOT 0.6 12/08/2018 0647   BILITOT 0.5 11/27/2018 0852       RADIOGRAPHIC STUDIES: Ct Abdomen Pelvis Wo Contrast  Result Date: 12/01/2018 CLINICAL DATA:  Abdominal pain and nausea.  History of lung cancer. EXAM: CT ABDOMEN AND PELVIS WITHOUT CONTRAST TECHNIQUE: Multidetector CT imaging of the abdomen and pelvis was performed following the standard protocol without IV contrast. COMPARISON:  CT abdomen pelvis dated October 31, 2018. FINDINGS: Lower chest: Unchanged peripheral nodular consolidation in the right lower lobe. Hepatobiliary: No focal liver abnormality is seen. Small layering gallstones versus sludge. No gallbladder wall thickening or biliary dilatation. Pancreas: Unremarkable. No pancreatic ductal dilatation or surrounding inflammatory changes. Spleen: Normal in size without focal abnormality. Adrenals/Urinary Tract: Adrenal glands are unremarkable. Kidneys are normal, without renal calculi, focal lesion, or hydronephrosis. Bladder is unremarkable. Stomach/Bowel: Stomach is within normal limits. Appendix appears normal. No evidence of bowel wall thickening, distention, or inflammatory changes. Vascular/Lymphatic: Aortoiliac atherosclerosis. No enlarged  abdominal or pelvic lymph nodes. Reproductive: Prostate is unremarkable. Other: Unchanged small fat containing umbilical and right inguinal hernias. No free fluid or pneumoperitoneum. Musculoskeletal: No acute or significant osseous findings. IMPRESSION: 1.  No acute intra-abdominal process. 2. Unchanged peripheral nodular consolidation   in the right lower lobe. 3.  Aortic atherosclerosis (ICD10-I70.0). Electronically Signed   By: William T Derry M.D.   On: 12/01/2018 20:09   Dg Chest 2 View  Result Date: 12/01/2018 CLINICAL DATA:  Weakness.  History of lung cancer. EXAM: CHEST - 2 VIEW COMPARISON:  CT chest from same day. Chest x-ray dated November 02, 2018. FINDINGS: Unchanged left chest wall port catheter. The heart size and mediastinal contours are within normal limits. Normal pulmonary vascularity. Patchy, nodular consolidation in the right lower lobe, better evaluated on chest CT from same day. No pleural effusion or pneumothorax. No acute osseous abnormality. IMPRESSION: 1. Patchy, nodular consolidation in the right lower lobe, better evaluated on chest CT from same day. Electronically Signed   By: William T Derry M.D.   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 Restaging. EXAM: CT CHEST WITHOUT CONTRAST TECHNIQUE: Multidetector CT imaging of the chest was performed following the standard protocol without IV contrast. COMPARISON:  10/20/2018 chest CT. FINDINGS: Cardiovascular: Normal heart size. No significant pericardial effusion/thickening. Three-vessel coronary atherosclerosis. Left internal jugular Port-A-Cath terminates at the cavoatrial junction. Atherosclerotic nonaneurysmal thoracic aorta. Normal caliber pulmonary arteries. Mediastinum/Nodes: No discrete thyroid nodules. Unremarkable esophagus. No axillary adenopathy. Mildly enlarged  1.5 cm lower right paratracheal node (series 2/image 61), previously 1.5 cm using similar measurement technique, stable. Mildly enlarged 1.2 cm subcarinal node (series 2/image 70), stable. No new pathologically enlarged mediastinal nodes. No discrete hilar adenopathy on this noncontrast scan. Lungs/Pleura: No pneumothorax. No pleural effusion. Mild-to-moderate centrilobular and paraseptal emphysema with prominent diffuse bronchial wall thickening. Previously described 2.3 cm focus of consolidation in the anterior peripheral right lower lobe on 10/20/2018 chest CT study has resolved with small residual linear scar in this location. New patchy irregular nodular foci of consolidation in the posterior right lower lobe, largest 2.3 x 2.2 cm (series 5/image 110) with some central ground-glass density. No additional significant pulmonary nodules. Upper abdomen: No acute abnormality. Musculoskeletal: No aggressive appearing focal osseous lesions. Mild thoracic spondylosis. IMPRESSION: 1. Right paratracheal and subcarinal lymphadenopathy is stable. No new or progressive thoracic adenopathy. 2. Waxing and waning irregular nodular foci of consolidation at the periphery of the right lung base. Differential considerations include cryptogenic organizing pneumonia and recurrent infectious/inflammatory pneumonia (including aspiration pneumonia), with metastatic spread considered less likely but not entirely excluded. Continued close chest CT follow-up advised. Aortic Atherosclerosis (ICD10-I70.0) and Emphysema (ICD10-J43.9). Electronically Signed   By: Jason A Poff M.D.   On: 12/01/2018 11:36   Ir Gastrostomy Tube Mod Sed  Result Date: 12/11/2018 INDICATION: Esophagitis EXAM: PERC PLACEMENT GASTROSTOMY MEDICATIONS: Ancef 2 g; Antibiotics were administered within 1 hour of the procedure. Glucagon 1 mg IV ANESTHESIA/SEDATION: Versed 6 mg IV; Fentanyl 100 mcg IV Moderate Sedation Time:  24 minutes The patient was continuously  monitored during the procedure by the interventional radiology nurse under my direct supervision. CONTRAST:  No iodinated contrast was administered FLUOROSCOPY TIME:  Fluoroscopy Time: 3 minutes 24 seconds (81 mGy). COMPLICATIONS: None immediate. PROCEDURE: The procedure, risks, benefits, and alternatives were explained to the patient. Questions regarding the procedure were encouraged and answered. The patient understands and consents to the procedure. The epigastrium was prepped with Betadine in a sterile fashion, and a sterile drape was applied covering the operative field. A sterile gown and sterile gloves were used for the procedure. A 5-French orogastric   tube is placed under fluoroscopic guidance. Scout imaging of the abdomen confirms barium within the transverse colon. The stomach was distended with gas. Under left anterior oblique fluoroscopic guidance, an 18 gauge needle was advanced into the lumen of the stomach via anterior wall puncture and a T tack was deployed. A total of 3 T tacks were deployed in a triangular configuration. A 19 gauge needle was advanced between the T tacks and into the lumen of the stomach then removed over an Amplatz wire. The tract was dilated to 18 French. An 18 French peel-away sheath was inserted. An 18 French gastrostomy tube was then advanced over the wire and through the peel-away sheath into the lumen of the stomach. The balloon was insufflated with 7 cc saline and secured in place. 60 cc of gas was injected into the gastrostomy tube. FINDINGS: Images document placement of a 18 French balloon retention gastrostomy tube into the lumen of the stomach. Gas injected into the gastrostomy tube distended the stomach compatible with proper positioning. IMPRESSION: Successful 18 French balloon retention gastrostomy tube placement. The tip is in the stomach. Electronically Signed   By: Arthur  Hoss M.D.   On: 12/11/2018 17:07    ASSESSMENT AND PLAN: This is a very pleasant 52 years  old African-American male recently diagnosed with limited stage small cell lung cancer and currently undergoing systemic chemotherapy with cisplatin and etoposide status post 4 cycles.  This is concurrent with radiotherapy. The patient has a rough time with his systemic chemotherapy as well as radiation.  He was admitted almost after every cycle of chemotherapy to the hospital with significant pancytopenia.  He was also complaining of dysphasia and odynophagia and has a PEG tube placed for nutrition. He had CT scan of the chest, abdomen and pelvis performed during his hospitalization.  His scan showed no concerning findings for disease progression. I had a lengthy discussion with the patient and his family today about his condition and further treatment options. I recommended for the patient to discontinue his systemic chemotherapy after cycle #4. I will continue to monitor him closely with repeat CT scan of the chest in 3 months for restaging of his disease. I referred the patient to Dr. Moody for consideration of prophylactic cranial irradiation. I will also refer the patient to the dietitian at the cancer center for evaluation and nutritional evaluation. He was advised to call immediately if he has any concerning symptoms in the interval. The patient voices understanding of current disease status and treatment options and is in agreement with the current care plan. All questions were answered. The patient knows to call the clinic with any problems, questions or concerns. We can certainly see the patient much sooner if necessary.  Disclaimer: This note was dictated with voice recognition software. Similar sounding words can inadvertently be transcribed and may not be corrected upon review.       

## 2018-12-25 NOTE — ED Triage Notes (Signed)
Pt had lunch cancer and while ago at store pt had syncopal episode. Pt c/o headache. Last chemo treatment was about month ago.

## 2018-12-25 NOTE — ED Provider Notes (Signed)
[facility] DEPT Provider Note   CSN: [id number] Arrival date & time: 12/25/18  1856     History   Chief Complaint Chief Complaint  Patient presents with  . Loss of Consciousness  . Headache    HPI RUDY LUHMANN is a 53 y.o. male past medical history of anxiety, CAD, COPD who presents for evaluation of syncopal episode that occurred while at a store.  Patient reports he was walking around with a friend at a store and stated that he felt "dizzy" patient had a witnessed syncopal episode.  He reports he fell backwards, hitting the back of his head and his neck.  Friend does report he had a positive LOC for a few seconds.  Patient states that he did not have any preceding chest pain.  He is on blood thinners.  Patient does report he has a history of lung cancer and finished chemo approximately 1 month ago.  He had an appointment with his oncologist today discussed if there is any further treatment needed.  Patient does report that for the last 2 days he has had some generalized abdominal pain and nausea/vomiting. No blood noted in the emesis. He has not had much of an appetite over the last few day, though he does get most of his nutrition through a PEG tube.  Patient does report he has been having some darker stools but no bright red blood.  His last bowel movement was today.  Patient denies any fevers, CP, SOB, numbness/weakness, vision changes.    The history is provided by the patient.    Past Medical History:  Diagnosis Date  . Anxiety   . CAD (coronary artery disease)    a. s/p multiple PCIs with last cath 11/2016 with severe multivessel CAD, s/p PCTA to LCx but unable to pass stent  . Chronic leg pain    bilateral  . Chronic lower back pain   . COPD (chronic obstructive pulmonary disease) ([place])   . Depression   . GERD (gastroesophageal reflux disease)    Takes Dexilant  . HLD (hyperlipidemia)   . Hypertension   . Rhabdomyolysis    h/o, r/t  statins  . Sleep apnea    "can't tolerate mask" (12/16/2016)  . Type II diabetes mellitus [facility])     Patient Active Problem List   Diagnosis Date Noted  . Symptomatic anemia 12/25/2018  . CKD (chronic kidney disease) stage 3, GFR 30-59 ml/min (HCC) 12/09/2018  . Radiation esophagitis   . Malnutrition of moderate degree 12/06/2018  . Esophageal stricture   . N&V (nausea and vomiting) 12/01/2018  . Antineoplastic chemotherapy induced pancytopenia ([place]) 12/01/2018  . Odynophagia   . Dysphagia 10/08/2018  . Hypokalemia 10/08/2018  . Hypomagnesemia 10/08/2018  . Type 2 diabetes mellitus ([place]) 10/08/2018  . Anemia 10/08/2018  . Sleep apnea 10/08/2018  . 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 Goals of care, counseling/discussion 08/31/2018  . Encounter for antineoplastic chemotherapy 08/31/2018  . Encounter for smoking cessation counseling 08/31/2018  . Malignant neoplasm of bronchus of right upper lobe ([place]) 08/29/2018  . Mediastinal mass 08/17/2018  . Vitamin D deficiency 08/04/2018  . Diabetic peripheral neuropathy ([place]) 06/30/2018  . Substernal chest pain 02/25/2018  . Benign prostatic hyperplasia with urinary hesitancy 11/17/2017  . Preoperative cardiovascular examination 07/12/2017  . Refractory angina ([place]) 05/24/2017  . Complex regional pain syndrome type I 03/24/2017  . Progressive angina ([place]) -Class III 12/16/2016  . Anxiety  06/28/2016  . Diastolic dysfunction-grade 2 with EF 60-65% Oct 2016 03/22/2016  . Hypertension 10/03/2015  . CAD S/P multiple PCI's 10/03/2015  . Pulmonary nodule 06/24/2015  . Cough 06/24/2015  . COPD with asthma ([place]) 06/24/2015  . Reactive airway disease 06/04/2015  . DOE (dyspnea on exertion) 04/15/2015  . Lumbar disc herniation with radiculopathy 03/31/2012  . Presence of stent in left circumflex coronary artery 10/18/2011    Class: History of  . TOBACCO ABUSE 02/27/2009  . ABDOMINAL PAIN, LEFT LOWER QUADRANT  12/30/2008  . ABDOMINAL PAIN, EPIGASTRIC 12/05/2008  . Anxiety and depression 09/05/2008  . Hereditary and idiopathic peripheral neuropathy 09/05/2008  . GERD 09/05/2008  . Cervical disc disorder with radiculopathy of cervical region 08/19/2008  . HLD (hyperlipidemia) 04/25/2008    Class: Diagnosis of  . ALLERGIC RHINITIS 04/25/2008  . Backache 04/25/2008  . Chest pain, unspecified 04/25/2008  . COLONIC POLYPS, HX OF 04/25/2008  . Obesity 04/18/2008    Class: Diagnosis of  . ANAL FISSURE, HX OF 04/18/2008  . Diabetes mellitus ([place]) 01/30/2008    Class: History of  . RECTAL BLEEDING 01/30/2008    Past Surgical History:  Procedure Laterality Date  . BACK SURGERY    . BRONCHIAL BIOPSY  08/25/2018   Procedure: BRONCHIAL BIOPSIES;  Surgeon: Garner Nash, DO;  Location: [facility];  Service: Cardiopulmonary;;  . CARDIAC CATHETERIZATION N/A 09/25/2015   Procedure: Left Heart Cath and Coronary Angiography;  Surgeon: Leonie Man, MD;  Location: [facility] LAB;  Service: Cardiovascular;  Laterality: N/A;  . CARDIAC CATHETERIZATION N/A 12/16/2016   Procedure: Left Heart Cath and Coronary Angiography;  Surgeon: Leonie Man, MD;  Location: [facility] LAB;  Service: Cardiovascular;  Laterality: N/A;  . CARDIAC CATHETERIZATION N/A 12/16/2016   Procedure: Coronary Balloon Angioplasty;  Surgeon: Leonie Man, MD;  Location: [facility] CV LAB;  Service: Cardiovascular;  Laterality: N/A;  . COLONOSCOPY W/ POLYPECTOMY    . CORONARY ANGIOPLASTY  09/25/2015   mid cir & om  . CORONARY ANGIOPLASTY WITH STENT PLACEMENT  10/09/2001   PTCA & stenting of mid AV circumflex; 2.5x57m Pixel stent  . CORONARY ANGIOPLASTY WITH STENT PLACEMENT  12/13/2001   PCI with stent to mid L circumflex, 95% stenosis to 0% residual  . CORONARY ANGIOPLASTY WITH STENT PLACEMENT  10/10/2003   PCI to mid AV circumflex; LAD 30% disease; RCA 100% occluded prox.  . CORONARY ANGIOPLASTY WITH STENT PLACEMENT   09/01/2011   PCI with stenting with bare metal stent to mid AV groove circumflex and PDA  . CORONARY ANGIOPLASTY WITH STENT PLACEMENT  10/17/2011   cutting balloon angioplasty of ostial lateral OM1 branch and bifurcation AV groove circumflex OM junction; stenosis reduced to 0%  . ENDOBRONCHIAL ULTRASOUND Bilateral 08/25/2018   Procedure: ENDOBRONCHIAL ULTRASOUND;  Surgeon: IGarner Nash DO;  Location: [facility];  Service: Cardiopulmonary;  Laterality: Bilateral;  . ESOPHAGOGASTRODUODENOSCOPY (EGD) WITH PROPOFOL N/A 12/08/2018   Procedure: ESOPHAGOGASTRODUODENOSCOPY (EGD) WITH PROPOFOL;  Surgeon: JMilus Banister MD;  Location: [facility];  Service: Endoscopy;  Laterality: N/A;  . EXCISIONAL HEMORRHOIDECTOMY    . FINE NEEDLE ASPIRATION  08/25/2018   Procedure: FINE NEEDLE ASPIRATION;  Surgeon: IGarner Nash DO;  Location: [facility];  Service: Cardiopulmonary;;  . FLEXIBLE BRONCHOSCOPY  08/25/2018   Procedure: FLEXIBLE BRONCHOSCOPY;  Surgeon: IGarner Nash DO;  Location: [facility];  Service: Cardiopulmonary;;  . IR GASTROSTOMY TUBE MOD SED  12/11/2018  . IR IMAGING GUIDED PORT  INSERTION  09/29/2018  . LEFT HEART CATHETERIZATION WITH CORONARY ANGIOGRAM N/A 10/18/2011   Procedure: LEFT HEART CATHETERIZATION WITH CORONARY ANGIOGRAM;  Surgeon: Leonie Man, MD;  Location[facility] LAB;  Service: Cardiovascular;  Laterality: N/A;  . LUMBAR LAMINECTOMY/DECOMPRESSION MICRODISCECTOMY  03/31/2012   Procedure: LUMBAR LAMINECTOMY/DECOMPRESSION MICRODISCECTOMY 1 LEVEL;  Surgeon: Charlie Pitter, MD;  Location: [facility] NEURO ORS;  Service: Neurosurgery;  Laterality: Left;  . TRANSTHORACIC ECHOCARDIOGRAM  07/28/2011   EF 55-65%; LVH, grade 1 diastolic dysfunction;         Home Medications    Prior to Admission medications   Medication Sig Start Date End Date Taking? Authorizing Provider  albuterol (PROAIR HFA) 108 (90 Base) MCG/ACT inhaler Inhale 1 puff into the lungs every 6 (six) hours as  needed for wheezing or shortness of breath. 12/19/18  Yes [name], MD  amLODipine (NORVASC) 10 MG tablet Take 1 tablet (10 mg total) by mouth daily. 12/19/18  Yes [name], MD  aspirin 81 MG tablet Take 1 tablet (81 mg total) daily by mouth. 10/10/17  Yes Newlin, Enobong, MD  baclofen (LIORESAL) 10 MG tablet Take 1 tablet (10 mg total) by mouth 3 (three) times daily as needed for muscle spasms. 10/11/18  Yes Aline August, MD  benzonatate (TESSALON) 100 MG capsule Take 1 capsule (100 mg total) by mouth every 8 (eight) hours. Patient taking differently: Take 100 mg by mouth 3 (three) times daily as needed for cough.  09/19/18  Yes Curt Bears, MD  Cholecalciferol (VITAMIN D) 2000 units tablet Take 1 tablet (2,000 Units total) by mouth daily. 08/04/18  Yes Jamse Arn, MD  clopidogrel (PLAVIX) 75 MG tablet Take 1 tablet (75 mg total) by mouth daily. YOU MAY RESTART 08/26/2018 12/19/18  Yes [name], MD  diclofenac sodium (VOLTAREN) 1 % GEL Apply 2 g topically 4 (four) times daily. 04/20/18  Yes [name], MD  dronabinol (MARINOL) 5 MG capsule Take 1 capsule (5 mg total) by mouth 2 (two) times daily before a meal. 10/18/18  Yes Tyler Pita, MD  FLUoxetine (PROZAC) 40 MG capsule Take 1 capsule (40 mg total) by mouth daily. 12/19/18  Yes Newlin, Charlane Ferretti, MD  Fluticasone-Salmeterol (ADVAIR) 100-50 MCG/DOSE AEPB Inhale 1 puff into the lungs 2 (two) times daily. 12/19/18  Yes [name], MD  hydrALAZINE (APRESOLINE) 25 MG tablet Take 1 tablet (25 mg total) by mouth 2 (two) times daily. 12/19/18  Yes [name], MD  HYDROcodone-acetaminophen (NORCO) 5-325 MG tablet Take 1 tablet by mouth every 6 (six) hours as needed for moderate pain. 12/21/18  Yes Curt Bears, MD  isosorbide mononitrate (IMDUR) 120 MG 24 hr tablet Take 1 tablet (120 mg total) by mouth daily. 12/19/18  Yes [name], MD  LORazepam (ATIVAN) 0.5 MG tablet 1 tab po q 4-6 hours prn or 1 tab  po 30 minutes prior to radiation 08/30/18  Yes Hayden Pedro, PA-C  magic mouthwash w/lidocaine SOLN Take 10 mLs by mouth 3 (three) times daily as needed for up to 20 days for mouth pain. 12/13/18 01/02/19 Yes Purohit, Konrad Dolores, MD  mirtazapine (REMERON) 30 MG tablet Take 1 tablet (30 mg total) by mouth at bedtime. 08/31/18  Yes Curt Bears, MD  Nebivolol HCl 20 MG TABS Take 1 tablet (20 mg total) by mouth daily. 12/19/18  Yes Newlin, Charlane Ferretti, MD  ondansetron (ZOFRAN) 8 MG tablet Take 1 tablet (8 mg total) by mouth every 8 (eight) hours as needed for nausea or vomiting.  09/20/18  Yes Curt Bears, MD  pantoprazole (PROTONIX) 40 MG tablet Take 1 tablet (40 mg total) by mouth daily. 12/19/18  Yes [name], MD  potassium chloride 20 MEQ TBCR Take 10 mEq by mouth daily. 11/13/18  Yes Curt Bears, MD  prazosin (MINIPRESS) 1 MG capsule Take 1 capsule (1 mg total) by mouth at bedtime. For nightmares 07/25/18  Yes [name], MD  prochlorperazine (COMPAZINE) 10 MG tablet Take 1 tablet (10 mg total) by mouth every 6 (six) hours as needed for nausea or vomiting. 10/23/18  Yes Curt Bears, MD  promethazine (PHENERGAN) 25 MG tablet Take 1 tablet (25 mg total) by mouth every 6 (six) hours as needed for nausea or vomiting. 10/22/18  Yes Deno Etienne, DO  ranolazine (RANEXA) 1000 MG SR tablet Take 1 tablet (1,000 mg total) by mouth 2 (two) times daily. 05/24/17  Yes Hilty, Nadean Corwin, MD  rosuvastatin (CRESTOR) 20 MG tablet Take 1 tablet (20 mg total) by mouth daily. 12/19/18  Yes [name], MD  sucralfate (CARAFATE) 1 g tablet Take 1 tablet by mouth 4 (four) times daily -  before meals and at bedtime. 09/23/18  Yes [provider]  tiotropium (SPIRIVA HANDIHALER) 18 MCG inhalation capsule Place 1 capsule (18 mcg total) into inhaler and inhale daily. 12/19/18  Yes [name], MD  vitamin B-12 (CYANOCOBALAMIN) 500 MCG tablet Take 1 tablet (500 mcg total) by mouth daily. 06/30/18   Yes Jamse Arn, MD  zolpidem (AMBIEN) 5 MG tablet Take 1 tablet (5 mg total) by mouth at bedtime. 12/14/18  Yes [name], MD  Blood Glucose Monitoring Suppl (ACCU-CHEK AVIVA PLUS) w/Device KIT Use as dircted 07/12/17   [name], MD  fluticasone (FLONASE) 50 MCG/ACT nasal spray Place 2 sprays into both nostrils daily as needed for allergies. Patient not taking: Reported on 12/25/2018 10/11/18   Aline August, MD  glucose blood (ACCU-CHEK AVIVA) test strip Use as instructed 07/12/17   [name], MD  hydrocortisone-pramoxine [facility]) 2.5-1 % rectal cream Place 1 application rectally as needed. Patient not taking: Reported on 12/25/2018 10/11/18   Aline August, MD  lidocaine-prilocaine (EMLA) cream Apply 1 application topically as needed. Apply 1 tsp on skin over port site one hour prior to chemotherapy. Cover with plastic wrap. Do not rub in medication. Patient not taking: Reported on 12/25/2018 11/27/18   Curt Bears, MD  nicotine (NICODERM CQ) 21 mg/24hr patch Place 1 patch (21 mg total) onto the skin daily. Patient not taking: Reported on 12/25/2018 08/31/18   Curt Bears, MD  nitroGLYCERIN (NITROSTAT) 0.4 MG SL tablet Place 1 tablet (0.4 mg total) under the tongue every 5 (five) minutes as needed for chest pain. Patient taking differently: Place 0.4 mg under the tongue every 5 (five) minutes as needed for chest pain. Chest pain 12/24/16   Erma Heritage, PA-C    Family History Family History  Problem Relation Age of Onset  . Heart attack Father   . Hypertension Mother   . Diabetes Mother   . Heart disease Brother        x 3   . Heart attack Brother        deceased  . Hypertension Sister   . Diabetes Sister   . Anesthesia problems Neg Hx   . Hypotension Neg Hx   . Malignant hyperthermia Neg Hx   . Pseudochol deficiency Neg Hx     Social History Social History   Tobacco Use  . Smoking status: Former  Smoker    Packs/day: 0.25    Years: 25.00      Pack years: 6.25    Types: Cigarettes    Last attempt to quit: 05/24/2015    Years since quitting: 3.5  . Smokeless tobacco: Never Used  Substance Use Topics  . Alcohol use: No    Alcohol/week: 0.0 standard drinks  . Drug use: No     Allergies   Iohexol   Review of Systems Review of Systems  Constitutional: Positive for appetite change. Negative for fever.  Respiratory: Negative for cough and shortness of breath.   Cardiovascular: Negative for chest pain.  Gastrointestinal: Negative for abdominal pain, nausea and vomiting.  Genitourinary: Negative for dysuria and hematuria.  Neurological: Positive for dizziness, syncope and weakness. Negative for headaches.  All other systems reviewed and are negative.    Physical Exam Updated Vital Signs BP 103/66   Pulse 89   Temp 97.9 F (36.6 C) (Oral)   Resp 18   SpO2 98%   Physical Exam Vitals signs and nursing note reviewed. Exam conducted with a chaperone present.  Constitutional:      Appearance: Normal appearance. He is well-developed.  HENT:     Head: Normocephalic and atraumatic.   Eyes:     General: Lids are normal.     Conjunctiva/sclera: Conjunctivae normal.     Pupils: Pupils are equal, round, and reactive to light.  Neck:     Musculoskeletal: Full passive range of motion without pain.      Comments: Tenderness noted to the proximal C spine. No deformities or crepitus.  Cardiovascular:     Rate and Rhythm: Normal rate and regular rhythm.     Pulses: Normal pulses.     Heart sounds: Normal heart sounds. No murmur. No friction rub. No gallop.   Pulmonary:     Effort: Pulmonary effort is normal.     Breath sounds: Normal breath sounds.  Chest:    Abdominal:     Palpations: Abdomen is soft. Abdomen is not rigid.     Tenderness: There is generalized abdominal tenderness. There is no guarding.  Genitourinary:    Rectum: Guaiac result negative.     Comments: The exam was performed with a chaperone present.   No gross melena noted on DRE. Musculoskeletal: Normal range of motion.  Skin:    General: Skin is warm and dry.     Capillary Refill: Capillary refill takes less than 2 seconds.  Neurological:     Mental Status: He is alert and oriented to person, place, and time.     Comments: Cranial nerves III-XII intact Follows commands, Moves all extremities  5/5 strength to BUE and BLE  Sensation intact throughout all major nerve distributions Normal coordination No pronator drift. No gait abnormalities  No slurred speech. No facial droop.   Psychiatric:        Speech: Speech normal.      ED Treatments / Results  Labs (all labs ordered are listed, but only abnormal results are displayed) Labs Reviewed  BASIC METABOLIC PANEL - Abnormal; Notable for the following components:      Result Value   Glucose, Bld 135 (*)    BUN 33 (*)    Creatinine, Ser 2.88 (*)    GFR calc non Af Amer 24 (*)    GFR calc Af Amer 28 (*)    All other components within normal limits  CBC - Abnormal; Notable for the following components:   RBC 2.60 (*)  Hemoglobin 7.7 (*)    HCT 24.7 (*)    RDW 15.7 (*)    All other components within normal limits  HEPATIC FUNCTION PANEL - Abnormal; Notable for the following components:   Albumin 3.2 (*)    AST 14 (*)    All other components within normal limits  CBG MONITORING, ED - Abnormal; Notable for the following components:   Glucose-Capillary 125 (*)    All other components within normal limits  TROPONIN I  URINALYSIS, ROUTINE W REFLEX MICROSCOPIC  OCCULT BLOOD X 1 CARD TO LAB, STOOL  POC OCCULT BLOOD, ED  PREPARE RBC (CROSSMATCH)  TYPE AND SCREEN    EKG None  Radiology Ct Head Wo Contrast  Result Date: 12/25/2018 CLINICAL DATA:  Syncope, headache, history of lung cancer EXAM: CT HEAD WITHOUT CONTRAST CT CERVICAL SPINE WITHOUT CONTRAST TECHNIQUE: Multidetector CT imaging of the head and cervical spine was performed following the standard protocol  without intravenous contrast. Multiplanar CT image reconstructions of the cervical spine were also generated. COMPARISON:  MR brain 09/06/2018 FINDINGS: CT HEAD FINDINGS Brain: No evidence of acute infarction, hemorrhage, hydrocephalus, extra-axial collection or mass lesion/mass effect. Vascular: No hyperdense vessel or unexpected calcification. Skull: No osseous abnormality. Sinuses/Orbits: Visualized paranasal sinuses are clear. Visualized mastoid sinuses are clear. Visualized orbits demonstrate no focal abnormality. Other: None CT CERVICAL SPINE FINDINGS Alignment: Normal. Skull base and vertebrae: No acute fracture. No primary bone lesion or focal pathologic process. Soft tissues and spinal canal: No prevertebral fluid or swelling. No visible canal hematoma. Disc levels: Degenerative disc disease with disc height loss at C5-6 and C6-7. Mild broad-based disc osteophyte complex at C5-6. No foraminal stenosis. Upper chest: Lung apices are clear. Other: No fluid collection or hematoma. IMPRESSION: 1. No acute intracranial pathology. 2.  No acute osseous injury of the cervical spine. Electronically Signed   By: Kathreen Devoid   On: 12/25/2018 21:09   Ct Cervical Spine Wo Contrast  Result Date: 12/25/2018 CLINICAL DATA:  Syncope, headache, history of lung cancer EXAM: CT HEAD WITHOUT CONTRAST CT CERVICAL SPINE WITHOUT CONTRAST TECHNIQUE: Multidetector CT imaging of the head and cervical spine was performed following the standard protocol without intravenous contrast. Multiplanar CT image reconstructions of the cervical spine were also generated. COMPARISON:  MR brain 09/06/2018 FINDINGS: CT HEAD FINDINGS Brain: No evidence of acute infarction, hemorrhage, hydrocephalus, extra-axial collection or mass lesion/mass effect. Vascular: No hyperdense vessel or unexpected calcification. Skull: No osseous abnormality. Sinuses/Orbits: Visualized paranasal sinuses are clear. Visualized mastoid sinuses are clear. Visualized  orbits demonstrate no focal abnormality. Other: None CT CERVICAL SPINE FINDINGS Alignment: Normal. Skull base and vertebrae: No acute fracture. No primary bone lesion or focal pathologic process. Soft tissues and spinal canal: No prevertebral fluid or swelling. No visible canal hematoma. Disc levels: Degenerative disc disease with disc height loss at C5-6 and C6-7. Mild broad-based disc osteophyte complex at C5-6. No foraminal stenosis. Upper chest: Lung apices are clear. Other: No fluid collection or hematoma. IMPRESSION: 1. No acute intracranial pathology. 2.  No acute osseous injury of the cervical spine. Electronically Signed   By: Kathreen Devoid   On: 12/25/2018 21:09    Procedures .Critical Care Performed by: Volanda Napoleon, PA-C Authorized by: Volanda Napoleon, PA-C   Critical care provider statement:    Critical care time (minutes):  45   Critical care was necessary to treat or prevent imminent or life-threatening deterioration of the following conditions:  Cardiac failure and circulatory failure  Critical care was time spent personally by me on the following activities:  Discussions with consultants, evaluation of patient's response to treatment, examination of patient, ordering and performing treatments and interventions, ordering and review of laboratory studies, ordering and review of radiographic studies, pulse oximetry, re-evaluation of patient's condition, obtaining history from patient or surrogate and review of old charts   (including critical care time)  Medications Ordered in ED Medications  0.9 %  sodium chloride infusion (0 mL/hr Intravenous Hold 12/25/18 2301)  sodium chloride 0.9 % bolus 1,000 mL (1,000 mLs Intravenous New Bag/Given 12/25/18 2308)  sodium chloride 0.9 % bolus 1,000 mL ( Intravenous Stopped 12/25/18 2055)  fentaNYL (SUBLIMAZE) injection 50 mcg (50 mcg Intravenous Given 12/25/18 2307)     Initial Impression / Assessment and Plan / ED Course  I have reviewed  the triage vital signs and the nursing notes.  Pertinent labs & imaging results that were available during my care of the patient were reviewed by me and considered in my medical decision making (see chart for details).     53 year old male past medical story lung cancer, CAD who presents for evaluation of syncopal episode that occurred while at a store.  Patient reports feeling dizzy right prior to onset of syncopal episode.  Patient did have positive LOC.  No preceding chest pain.  He reports he has been weak over the last 4 days and states he has had some nausea, vomiting and generalized abdominal pain.  Additionally, he noted some dark stools.  No bright red blood noted.  Patient reports he is on Plavix and states he has been compliant with it.  Patient denies any fevers, chest pain, numbness/weakness of his arms or legs.  On exam, lungs clear to auscultation.  Generalized tenderness with no focal point.  Will neuro exam.  We will plan to check labs, head CT, CT C-spine, EKG.  Fecal occult blood is negative.  Troponin negative.  LFTs show no abnormalities.  BMP shows slight bump in BUN and creatinine.  His baseline creatinine is around 2.5.  CBC shows no leukocytosis.  CBC EMEA of 7.7.  Review of his records show that his most recent hemoglobins have been in the nines.  Earlier today, he had a hemoglobin of 9.0.  UA shows no evidence of gross melena.  Brown stool noted on exam.  CT scan negative for any acute abnormalities.  CT cervical spine negative for any acute bony abnormality.  She is still hypotensive despite fluids.  At this time, consider symptomatic hypotension from anemia.  Unclear of source of anemia.  Will plan for admission.  Transfusion initiated.  Discussed patient with Dr. Hal Hope (hospitalist).  He accepts patient for admission.  Final Clinical Impressions(s) / ED Diagnoses   Final diagnoses:  Symptomatic anemia  Hypotension, unspecified hypotension type  Syncope,  unspecified syncope type    ED Discharge Orders    None       Desma Mcgregor 12/25/18 2322    Quintella Reichert, MD 12/29/18 1017

## 2018-12-25 NOTE — ED Notes (Signed)
Alexis in lab to add on troponin

## 2018-12-25 NOTE — Progress Notes (Signed)
Nutrition follow up could not be completed because chemotherapy was cancelled.

## 2018-12-26 ENCOUNTER — Inpatient Hospital Stay: Payer: Medicare Other

## 2018-12-26 DIAGNOSIS — N179 Acute kidney failure, unspecified: Secondary | ICD-10-CM | POA: Diagnosis present

## 2018-12-26 DIAGNOSIS — I2510 Atherosclerotic heart disease of native coronary artery without angina pectoris: Secondary | ICD-10-CM

## 2018-12-26 DIAGNOSIS — N183 Chronic kidney disease, stage 3 (moderate): Secondary | ICD-10-CM | POA: Diagnosis present

## 2018-12-26 DIAGNOSIS — I959 Hypotension, unspecified: Secondary | ICD-10-CM | POA: Diagnosis present

## 2018-12-26 DIAGNOSIS — J449 Chronic obstructive pulmonary disease, unspecified: Secondary | ICD-10-CM | POA: Diagnosis present

## 2018-12-26 DIAGNOSIS — G8929 Other chronic pain: Secondary | ICD-10-CM | POA: Diagnosis present

## 2018-12-26 DIAGNOSIS — F329 Major depressive disorder, single episode, unspecified: Secondary | ICD-10-CM | POA: Diagnosis present

## 2018-12-26 DIAGNOSIS — D649 Anemia, unspecified: Secondary | ICD-10-CM | POA: Diagnosis not present

## 2018-12-26 DIAGNOSIS — C3411 Malignant neoplasm of upper lobe, right bronchus or lung: Secondary | ICD-10-CM | POA: Diagnosis present

## 2018-12-26 DIAGNOSIS — Y92512 Supermarket, store or market as the place of occurrence of the external cause: Secondary | ICD-10-CM | POA: Diagnosis not present

## 2018-12-26 DIAGNOSIS — E11649 Type 2 diabetes mellitus with hypoglycemia without coma: Secondary | ICD-10-CM | POA: Diagnosis not present

## 2018-12-26 DIAGNOSIS — Z931 Gastrostomy status: Secondary | ICD-10-CM | POA: Diagnosis not present

## 2018-12-26 DIAGNOSIS — D631 Anemia in chronic kidney disease: Secondary | ICD-10-CM | POA: Diagnosis present

## 2018-12-26 DIAGNOSIS — E43 Unspecified severe protein-calorie malnutrition: Secondary | ICD-10-CM | POA: Diagnosis present

## 2018-12-26 DIAGNOSIS — R1310 Dysphagia, unspecified: Secondary | ICD-10-CM | POA: Diagnosis present

## 2018-12-26 DIAGNOSIS — E1122 Type 2 diabetes mellitus with diabetic chronic kidney disease: Secondary | ICD-10-CM | POA: Diagnosis present

## 2018-12-26 DIAGNOSIS — K210 Gastro-esophageal reflux disease with esophagitis: Secondary | ICD-10-CM | POA: Diagnosis present

## 2018-12-26 DIAGNOSIS — M545 Low back pain: Secondary | ICD-10-CM | POA: Diagnosis present

## 2018-12-26 DIAGNOSIS — R55 Syncope and collapse: Secondary | ICD-10-CM | POA: Diagnosis not present

## 2018-12-26 DIAGNOSIS — Z6826 Body mass index (BMI) 26.0-26.9, adult: Secondary | ICD-10-CM | POA: Diagnosis not present

## 2018-12-26 DIAGNOSIS — E860 Dehydration: Secondary | ICD-10-CM | POA: Diagnosis present

## 2018-12-26 DIAGNOSIS — I129 Hypertensive chronic kidney disease with stage 1 through stage 4 chronic kidney disease, or unspecified chronic kidney disease: Secondary | ICD-10-CM | POA: Diagnosis present

## 2018-12-26 DIAGNOSIS — E785 Hyperlipidemia, unspecified: Secondary | ICD-10-CM | POA: Diagnosis present

## 2018-12-26 DIAGNOSIS — Z9861 Coronary angioplasty status: Secondary | ICD-10-CM

## 2018-12-26 DIAGNOSIS — G4730 Sleep apnea, unspecified: Secondary | ICD-10-CM | POA: Diagnosis present

## 2018-12-26 DIAGNOSIS — F419 Anxiety disorder, unspecified: Secondary | ICD-10-CM | POA: Diagnosis present

## 2018-12-26 DIAGNOSIS — M79605 Pain in left leg: Secondary | ICD-10-CM | POA: Diagnosis present

## 2018-12-26 DIAGNOSIS — M79604 Pain in right leg: Secondary | ICD-10-CM | POA: Diagnosis present

## 2018-12-26 DIAGNOSIS — W1839XA Other fall on same level, initial encounter: Secondary | ICD-10-CM | POA: Diagnosis present

## 2018-12-26 LAB — CBC
HCT: 31.3 % — ABNORMAL LOW (ref 39.0–52.0)
Hemoglobin: 10.1 g/dL — ABNORMAL LOW (ref 13.0–17.0)
MCH: 29.9 pg (ref 26.0–34.0)
MCHC: 32.3 g/dL (ref 30.0–36.0)
MCV: 92.6 fL (ref 80.0–100.0)
Platelets: 175 10*3/uL (ref 150–400)
RBC: 3.38 MIL/uL — ABNORMAL LOW (ref 4.22–5.81)
RDW: 15.3 % (ref 11.5–15.5)
WBC: 8.4 10*3/uL (ref 4.0–10.5)
nRBC: 0 % (ref 0.0–0.2)

## 2018-12-26 LAB — BASIC METABOLIC PANEL
Anion gap: 8 (ref 5–15)
BUN: 30 mg/dL — ABNORMAL HIGH (ref 6–20)
CALCIUM: 8.7 mg/dL — AB (ref 8.9–10.3)
CO2: 25 mmol/L (ref 22–32)
Chloride: 107 mmol/L (ref 98–111)
Creatinine, Ser: 2.41 mg/dL — ABNORMAL HIGH (ref 0.61–1.24)
GFR calc Af Amer: 34 mL/min — ABNORMAL LOW (ref >60)
GFR, EST NON AFRICAN AMERICAN: 30 mL/min — AB (ref >60)
Glucose, Bld: 86 mg/dL (ref 70–99)
Potassium: 4 mmol/L (ref 3.5–5.1)
SODIUM: 140 mmol/L (ref 135–145)

## 2018-12-26 LAB — URINALYSIS, ROUTINE W REFLEX MICROSCOPIC
Bilirubin Urine: NEGATIVE
Glucose, UA: NEGATIVE mg/dL
Hgb urine dipstick: NEGATIVE
Ketones, ur: 5 mg/dL — AB
Leukocytes, UA: NEGATIVE
Nitrite: NEGATIVE
PH: 6 (ref 5.0–8.0)
Protein, ur: NEGATIVE mg/dL
Specific Gravity, Urine: 1.012 (ref 1.005–1.030)

## 2018-12-26 LAB — GLUCOSE, CAPILLARY
Glucose-Capillary: 109 mg/dL — ABNORMAL HIGH (ref 70–99)
Glucose-Capillary: 65 mg/dL — ABNORMAL LOW (ref 70–99)
Glucose-Capillary: 72 mg/dL (ref 70–99)
Glucose-Capillary: 79 mg/dL (ref 70–99)

## 2018-12-26 LAB — PHOSPHORUS: Phosphorus: 3.4 mg/dL (ref 2.5–4.6)

## 2018-12-26 LAB — MAGNESIUM: Magnesium: 1.9 mg/dL (ref 1.7–2.4)

## 2018-12-26 LAB — TROPONIN I

## 2018-12-26 MED ORDER — PANTOPRAZOLE SODIUM 40 MG PO TBEC: 40.0000 mg | DELAYED_RELEASE_TABLET | Freq: Every day | ORAL | Status: DC

## 2018-12-26 MED ORDER — SODIUM CHLORIDE 0.9 % IV BOLUS
500.0000 mL | Freq: Once | INTRAVENOUS | Status: AC
  Administered 2018-12-26: 500 mL via INTRAVENOUS

## 2018-12-26 MED ORDER — HYDROCODONE-ACETAMINOPHEN 5-325 MG PO TABS
1.0000 | ORAL_TABLET | Freq: Four times a day (QID) | ORAL | Status: DC | PRN
  Administered 2018-12-26 – 2018-12-28 (×9): 1 via ORAL
  Filled 2018-12-26 (×9): qty 1

## 2018-12-26 MED ORDER — SODIUM CHLORIDE 0.9 % IV SOLN
INTRAVENOUS | Status: DC
  Administered 2018-12-26 – 2018-12-27 (×3): via INTRAVENOUS

## 2018-12-26 MED ORDER — ONDANSETRON HCL 4 MG PO TABS: 4.0000 mg | ORAL_TABLET | Freq: Four times a day (QID) | ORAL | Status: DC | PRN

## 2018-12-26 MED ORDER — PANTOPRAZOLE SODIUM 40 MG PO TBEC
40.0000 mg | DELAYED_RELEASE_TABLET | Freq: Every day | ORAL | Status: DC
  Administered 2018-12-26 – 2018-12-28 (×3): 40 mg via ORAL
  Filled 2018-12-26 (×3): qty 1

## 2018-12-26 MED ORDER — LIDOCAINE VISCOUS HCL 2 % MT SOLN
15.0000 mL | Freq: Once | OROMUCOSAL | Status: DC
  Filled 2018-12-26: qty 15

## 2018-12-26 MED ORDER — ENOXAPARIN SODIUM 40 MG/0.4ML ~~LOC~~ SOLN
40.0000 mg | Freq: Every day | SUBCUTANEOUS | Status: DC
  Administered 2018-12-26 – 2018-12-27 (×2): 40 mg via SUBCUTANEOUS
  Filled 2018-12-26 (×2): qty 0.4

## 2018-12-26 MED ORDER — ALBUTEROL SULFATE (2.5 MG/3ML) 0.083% IN NEBU: 3.0000 mL | INHALATION_SOLUTION | Freq: Four times a day (QID) | RESPIRATORY_TRACT | Status: DC | PRN

## 2018-12-26 MED ORDER — CYANOCOBALAMIN 500 MCG PO TABS
500.0000 ug | ORAL_TABLET | Freq: Every day | ORAL | Status: DC
  Administered 2018-12-26 – 2018-12-28 (×3): 500 ug via ORAL
  Filled 2018-12-26 (×3): qty 1

## 2018-12-26 MED ORDER — FLUOXETINE HCL 20 MG PO CAPS
40.0000 mg | ORAL_CAPSULE | Freq: Every day | ORAL | Status: DC
  Administered 2018-12-26 – 2018-12-28 (×3): 40 mg via ORAL
  Filled 2018-12-26 (×3): qty 2

## 2018-12-26 MED ORDER — PRO-STAT SUGAR FREE PO LIQD
30.0000 mL | Freq: Two times a day (BID) | ORAL | Status: DC
  Administered 2018-12-27: 30 mL
  Filled 2018-12-26 (×3): qty 30

## 2018-12-26 MED ORDER — ACETAMINOPHEN 325 MG PO TABS: 650.0000 mg | ORAL_TABLET | Freq: Four times a day (QID) | ORAL | Status: DC | PRN

## 2018-12-26 MED ORDER — SODIUM CHLORIDE 0.9 % IV SOLN: INTRAVENOUS | Status: DC

## 2018-12-26 MED ORDER — ZOLPIDEM TARTRATE 5 MG PO TABS
5.0000 mg | ORAL_TABLET | Freq: Every day | ORAL | Status: DC
  Administered 2018-12-26 – 2018-12-27 (×3): 5 mg via ORAL
  Filled 2018-12-26 (×3): qty 1

## 2018-12-26 MED ORDER — ALUM & MAG HYDROXIDE-SIMETH 200-200-20 MG/5ML PO SUSP
30.0000 mL | Freq: Once | ORAL | Status: DC
  Filled 2018-12-26: qty 30

## 2018-12-26 MED ORDER — MOMETASONE FURO-FORMOTEROL FUM 100-5 MCG/ACT IN AERO
2.0000 | INHALATION_SPRAY | Freq: Two times a day (BID) | RESPIRATORY_TRACT | Status: DC
  Administered 2018-12-26 – 2018-12-28 (×5): 2 via RESPIRATORY_TRACT
  Filled 2018-12-26: qty 8.8

## 2018-12-26 MED ORDER — NEBIVOLOL HCL 10 MG PO TABS
20.0000 mg | ORAL_TABLET | Freq: Every day | ORAL | Status: DC
  Administered 2018-12-26: 20 mg via ORAL
  Filled 2018-12-26 (×2): qty 2

## 2018-12-26 MED ORDER — MIRTAZAPINE 30 MG PO TABS
30.0000 mg | ORAL_TABLET | Freq: Every day | ORAL | Status: DC
  Administered 2018-12-26 – 2018-12-27 (×3): 30 mg via ORAL
  Filled 2018-12-26: qty 2
  Filled 2018-12-26 (×3): qty 1
  Filled 2018-12-26: qty 2

## 2018-12-26 MED ORDER — SUCRALFATE 1 G PO TABS
1.0000 g | ORAL_TABLET | Freq: Three times a day (TID) | ORAL | Status: DC
  Administered 2018-12-26 – 2018-12-27 (×6): 1 g via ORAL
  Filled 2018-12-26 (×7): qty 1

## 2018-12-26 MED ORDER — ASPIRIN EC 81 MG PO TBEC
81.0000 mg | DELAYED_RELEASE_TABLET | Freq: Every day | ORAL | Status: DC
  Administered 2018-12-26 – 2018-12-28 (×3): 81 mg via ORAL
  Filled 2018-12-26 (×3): qty 1

## 2018-12-26 MED ORDER — SODIUM CHLORIDE 0.9% FLUSH
10.0000 mL | Freq: Two times a day (BID) | INTRAVENOUS | Status: DC
  Administered 2018-12-26 – 2018-12-28 (×2): 10 mL

## 2018-12-26 MED ORDER — ACETAMINOPHEN 650 MG RE SUPP: 650.0000 mg | Freq: Four times a day (QID) | RECTAL | Status: DC | PRN

## 2018-12-26 MED ORDER — UMECLIDINIUM BROMIDE 62.5 MCG/INH IN AEPB
1.0000 | INHALATION_SPRAY | Freq: Every day | RESPIRATORY_TRACT | Status: DC
  Administered 2018-12-26 – 2018-12-28 (×3): 1 via RESPIRATORY_TRACT
  Filled 2018-12-26: qty 7

## 2018-12-26 MED ORDER — RANOLAZINE ER 500 MG PO TB12
1000.0000 mg | ORAL_TABLET | Freq: Two times a day (BID) | ORAL | Status: DC
  Administered 2018-12-26 – 2018-12-28 (×5): 1000 mg via ORAL
  Filled 2018-12-26 (×6): qty 2

## 2018-12-26 MED ORDER — POTASSIUM CHLORIDE CRYS ER 10 MEQ PO TBCR
10.0000 meq | EXTENDED_RELEASE_TABLET | Freq: Every day | ORAL | Status: DC
  Administered 2018-12-26 – 2018-12-28 (×3): 10 meq via ORAL
  Filled 2018-12-26 (×5): qty 1

## 2018-12-26 MED ORDER — PROCHLORPERAZINE MALEATE 10 MG PO TABS
10.0000 mg | ORAL_TABLET | Freq: Four times a day (QID) | ORAL | Status: DC | PRN
  Administered 2018-12-26 – 2018-12-27 (×3): 10 mg via ORAL
  Filled 2018-12-26 (×3): qty 1

## 2018-12-26 MED ORDER — BACLOFEN 10 MG PO TABS: 10.0000 mg | ORAL_TABLET | Freq: Three times a day (TID) | ORAL | Status: DC | PRN

## 2018-12-26 MED ORDER — BENZONATATE 100 MG PO CAPS: 100.0000 mg | ORAL_CAPSULE | Freq: Three times a day (TID) | ORAL | Status: DC | PRN

## 2018-12-26 MED ORDER — CLOPIDOGREL BISULFATE 75 MG PO TABS
75.0000 mg | ORAL_TABLET | Freq: Every day | ORAL | Status: DC
  Administered 2018-12-26 – 2018-12-28 (×3): 75 mg via ORAL
  Filled 2018-12-26 (×3): qty 1

## 2018-12-26 MED ORDER — NON FORMULARY: 237.0000 mL | Freq: Every day | Status: DC

## 2018-12-26 MED ORDER — ENSURE ENLIVE PO LIQD
237.0000 mL | Freq: Two times a day (BID) | ORAL | Status: DC
  Administered 2018-12-26: 237 mL via ORAL

## 2018-12-26 MED ORDER — TWOCAL HN PO LIQD
237.0000 mL | Freq: Every day | ORAL | Status: DC
  Administered 2018-12-26 – 2018-12-28 (×8): 237 mL
  Filled 2018-12-26 (×9): qty 237

## 2018-12-26 MED ORDER — HYDRALAZINE HCL 20 MG/ML IJ SOLN: 10.0000 mg | INTRAMUSCULAR | Status: DC | PRN

## 2018-12-26 MED ORDER — SODIUM CHLORIDE 0.9% FLUSH
10.0000 mL | INTRAVENOUS | Status: DC | PRN
  Administered 2018-12-26 (×2): 10 mL
  Administered 2018-12-27: 20 mL
  Filled 2018-12-26 (×3): qty 40

## 2018-12-26 MED ORDER — SODIUM CHLORIDE 0.9 % IV SOLN
INTRAVENOUS | Status: DC
  Administered 2018-12-26: 10:00:00 via INTRAVENOUS

## 2018-12-26 MED ORDER — ROSUVASTATIN CALCIUM 20 MG PO TABS
20.0000 mg | ORAL_TABLET | Freq: Every day | ORAL | Status: DC
  Administered 2018-12-26 – 2018-12-27 (×2): 20 mg via ORAL
  Filled 2018-12-26 (×2): qty 1

## 2018-12-26 MED ORDER — ONDANSETRON HCL 4 MG/2ML IJ SOLN
4.0000 mg | Freq: Four times a day (QID) | INTRAMUSCULAR | Status: DC | PRN
  Administered 2018-12-26 – 2018-12-28 (×5): 4 mg via INTRAVENOUS
  Filled 2018-12-26 (×6): qty 2

## 2018-12-26 MED ORDER — DRONABINOL 5 MG PO CAPS
5.0000 mg | ORAL_CAPSULE | Freq: Two times a day (BID) | ORAL | Status: DC
  Administered 2018-12-26 – 2018-12-28 (×5): 5 mg via ORAL
  Filled 2018-12-26 (×5): qty 1

## 2018-12-26 NOTE — Progress Notes (Signed)
[facility]  Patient Status: Active (receiving services up to time of hospitalization)  [facility] is providing the following services: RN  If patient discharges after hours, please call [phone].   Edwinna Areola 12/26/2018, 2:14 PM

## 2018-12-26 NOTE — Evaluation (Signed)
Physical Therapy Evaluation Patient Details Name: Andrew West MRN: [id number] DOB: Feb 19, 1966 Today's Date: 12/26/2018   History of Present Illness  53 year old male with CAD, hypertension, COPD, small cell lung cancer presently on chemo and radiation.   Patient had a syncopal episode at the convenience store.  Dx of anemia, dehydration.  Clinical Impression  Pt is independent with mobility, no further PT indicated. From PT standpoint, he is ready to DC home. Pt ambulated 400' without an assistive device, no loss of balance. Orthostatic vitals obtained and were negative (see flowsheets), pt denied dizziness with position changes.     Follow Up Recommendations No PT follow up    Equipment Recommendations  None recommended by PT    Recommendations for Other Services       Precautions / Restrictions Precautions Precautions: Fall Precaution Comments: 3 recent falls, all related to present illness (anemia, dehydration), pt denies falls at baseline Restrictions Weight Bearing Restrictions: No      Mobility  Bed Mobility Overal bed mobility: Independent                Transfers Overall transfer level: Independent                  Ambulation/Gait Ambulation/Gait assistance: Independent Gait Distance (Feet): 400 Feet Assistive device: None Gait Pattern/deviations: WFL(Within Functional Limits) Gait velocity: WNL   General Gait Details: steady with RW, no loss of balance, no dizziness  Stairs            Wheelchair Mobility    Modified Rankin (Stroke Patients Only)       Balance Overall balance assessment: Independent                                           Pertinent Vitals/Pain Pain Assessment: 0-10 Pain Score: 8  Pain Location: posterior neck (from hitting this area when he fell) Pain Descriptors / Indicators: Sore Pain Intervention(s): Limited activity within patient's tolerance;Patient requesting pain meds-RN  notified;Premedicated before session    Home Living Family/patient expects to be discharged to:: Private residence Living Arrangements: Spouse/significant other Available Help at Discharge: Family           Home Equipment: [employer] Mor - single point      Prior Function Level of Independence: Independent with assistive device(s)         Comments: sometimes uses cane when he's feeling lightheaded     Hand Dominance        Extremity/Trunk Assessment   Upper Extremity Assessment Upper Extremity Assessment: Overall WFL for tasks assessed    Lower Extremity Assessment Lower Extremity Assessment: Overall WFL for tasks assessed    Cervical / Trunk Assessment Cervical / Trunk Assessment: Normal  Communication   Communication: No difficulties  Cognition Arousal/Alertness: Awake/alert Behavior During Therapy: WFL for tasks assessed/performed Overall Cognitive Status: Within Functional Limits for tasks assessed                                        General Comments      Exercises     Assessment/Plan    PT Assessment Patent does not need any further PT services  PT Problem List         PT Treatment Interventions      PT Goals (Current goals  can be found in the Care Plan section)  Acute Rehab PT Goals Patient Stated Goal: play with grandkids PT Goal Formulation: All assessment and education complete, DC therapy    Frequency     Barriers to discharge        Co-evaluation               AM-PAC PT "6 Clicks" Mobility  Outcome Measure Help needed turning from your back to your side while in a flat bed without using bedrails?: None Help needed moving from lying on your back to sitting on the side of a flat bed without using bedrails?: None Help needed moving to and from a bed to a chair (including a wheelchair)?: None Help needed standing up from a chair using your arms (e.g., wheelchair or bedside chair)?: None Help needed to walk in  hospital room?: None Help needed climbing 3-5 steps with a railing? : None 6 Click Score: 24    End of Session   Activity Tolerance: Patient tolerated treatment well Patient left: in bed;with call bell/phone within reach Nurse Communication: Mobility status      Time: 4098-1191 PT Time Calculation (min) (ACUTE ONLY): 14 min   Charges:   PT Evaluation $PT Eval Low Complexity: 1 Low         Tamala Ser PT 12/26/2018  Acute Rehabilitation Services Pager [phone] Office [phone]

## 2018-12-26 NOTE — Evaluation (Signed)
Clinical/Bedside Swallow Evaluation Patient Details  Name: Andrew West MRN: [id number] Date of Birth: May 10, 1966  Today's Date: 12/26/2018 Time: SLP Start Time (ACUTE ONLY): 1352 SLP Stop Time (ACUTE ONLY): 1435 SLP Time Calculation (min) (ACUTE ONLY): 43 min  Past Medical History:  Past Medical History:  Diagnosis Date  . Anxiety   . CAD (coronary artery disease)    a. s/p multiple PCIs with last cath 11/2016 with severe multivessel CAD, s/p PCTA to LCx but unable to pass stent  . Chronic leg pain    bilateral  . Chronic lower back pain   . COPD (chronic obstructive pulmonary disease) (HCC)   . Depression   . GERD (gastroesophageal reflux disease)    Takes Dexilant  . HLD (hyperlipidemia)   . Hypertension   . Rhabdomyolysis    h/o, r/t statins  . Sleep apnea    "can't tolerate mask" (12/16/2016)  . Type II diabetes mellitus (HCC)    Past Surgical History:  Past Surgical History:  Procedure Laterality Date  . BACK SURGERY    . BRONCHIAL BIOPSY  08/25/2018   Procedure: BRONCHIAL BIOPSIES;  Surgeon: Josephine Igo, DO;  Location: [facility] ENDOSCOPY;  Service: Cardiopulmonary;;  . CARDIAC CATHETERIZATION N/A 09/25/2015   Procedure: Left Heart Cath and Coronary Angiography;  Surgeon: Marykay Lex, MD;  Location: [facility] INVASIVE CV LAB;  Service: Cardiovascular;  Laterality: N/A;  . CARDIAC CATHETERIZATION N/A 12/16/2016   Procedure: Left Heart Cath and Coronary Angiography;  Surgeon: Marykay Lex, MD;  Location: [facility] INVASIVE CV LAB;  Service: Cardiovascular;  Laterality: N/A;  . CARDIAC CATHETERIZATION N/A 12/16/2016   Procedure: Coronary Balloon Angioplasty;  Surgeon: Marykay Lex, MD;  Location: [facility] INVASIVE CV LAB;  Service: Cardiovascular;  Laterality: N/A;  . COLONOSCOPY W/ POLYPECTOMY    . CORONARY ANGIOPLASTY  09/25/2015   mid cir & om  . CORONARY ANGIOPLASTY WITH STENT PLACEMENT  10/09/2001   PTCA & stenting of mid AV circumflex; 2.5x82mm Pixel stent  . CORONARY  ANGIOPLASTY WITH STENT PLACEMENT  12/13/2001   PCI with stent to mid L circumflex, 95% stenosis to 0% residual  . CORONARY ANGIOPLASTY WITH STENT PLACEMENT  10/10/2003   PCI to mid AV circumflex; LAD 30% disease; RCA 100% occluded prox.  . CORONARY ANGIOPLASTY WITH STENT PLACEMENT  09/01/2011   PCI with stenting with bare metal stent to mid AV groove circumflex and PDA  . CORONARY ANGIOPLASTY WITH STENT PLACEMENT  10/17/2011   cutting balloon angioplasty of ostial lateral OM1 branch and bifurcation AV groove circumflex OM junction; stenosis reduced to 0%  . ENDOBRONCHIAL ULTRASOUND Bilateral 08/25/2018   Procedure: ENDOBRONCHIAL ULTRASOUND;  Surgeon: Josephine Igo, DO;  Location: [facility];  Service: Cardiopulmonary;  Laterality: Bilateral;  . ESOPHAGOGASTRODUODENOSCOPY (EGD) WITH PROPOFOL N/A 12/08/2018   Procedure: ESOPHAGOGASTRODUODENOSCOPY (EGD) WITH PROPOFOL;  Surgeon: Rachael Fee, MD;  Location: [facility];  Service: Endoscopy;  Laterality: N/A;  . EXCISIONAL HEMORRHOIDECTOMY    . FINE NEEDLE ASPIRATION  08/25/2018   Procedure: FINE NEEDLE ASPIRATION;  Surgeon: Josephine Igo, DO;  Location: [facility];  Service: Cardiopulmonary;;  . FLEXIBLE BRONCHOSCOPY  08/25/2018   Procedure: FLEXIBLE BRONCHOSCOPY;  Surgeon: Josephine Igo, DO;  Location: [facility]OS[facility];  Service: Cardiopulmonary;;  . IR GASTROSTOMY TUBE MOD SED  12/11/2018  . IR IMAGING GUIDED PORT INSERTION  09/29/2018  . LEFT HEART CATHETERIZATION WITH CORONARY ANGIOGRAM N/A 10/18/2011   Procedure: LEFT HEART CATHETERIZATION WITH CORONARY ANGIOGRAM;  Surgeon: Marykay Lex,  MD;  Location: [facility] CATH LAB;  Service: Cardiovascular;  Laterality: N/A;  . LUMBAR LAMINECTOMY/DECOMPRESSION MICRODISCECTOMY  03/31/2012   Procedure: LUMBAR LAMINECTOMY/DECOMPRESSION MICRODISCECTOMY 1 LEVEL;  Surgeon: Temple Pacini, MD;  Location: [facility] NEURO ORS;  Service: Neurosurgery;  Laterality: Left;  . TRANSTHORACIC ECHOCARDIOGRAM  07/28/2011   EF  55-65%; LVH, grade 1 diastolic dysfunction;    HPI:  53 yo male adm to [facility] after fall due to syncope.  PMH + for stage III lung cancer s/p XRT with subsequent radiation esophagitis, odynophagia with PEG placed 12/11/2018.  Pt reports chest/lung radiation is finished and that oncology recommends brain radiation.  Pt reports concerns about this plan and advised he speak to his oncologist about these concerns.  Pt working with dietician to maximize intake/nutrition as he was dehydrated when admitted.  Pt has undergone esophagram 09/2018 and endoscopy 11/2018 but was not dilated due to esophagitis, friability of esophagus.  Pt reports ongoing odynophagia that has not improved since he was scoped.  SLP swallow eval ordered.    Assessment / Plan / Recommendation Clinical Impression  Pt with negative cranial nerve exam and he does not present with oral candidiasis.  He consumed tea and part of his chicken sandwich during session.  No indications of aspiration but pt does admit to issues with sensing residuals pointing to proximal esophagus.  Odynophagia continued to be reported to which pt is taking pain medicine prior to meals.    Reviewed prior esophagram 09/2018 and endoscopy 11/2018 with pt to help him with compensation strategies to mitigate his dysphagia.  Also provided handout with diagram for education re: pharyngeal residuals on testing.     Advised pt to start meals with liquids, masticate food to small than 7 mm size *size of narrowing of proximal to mid-esophagus lumen per esophagram and endoscopy*.    Indicated need to speak to pharmacy about medication administration due to known dysphagia.  Reviewed pt need to assure adequate clearance of po medications due to narrowing especially with meds that may be caustic if lodge.    Pt is compensating well for his dysphagia and thus recommend diet as tolerated.    Advised he monitor dysphagia and if not improving,  follow up with GI due to risk of  radiation induced stricture. All questions answered and written material provided to pt. Thanks very much for this consult of this most pleasant pt.     SLP Visit Diagnosis: Dysphagia, unspecified (R13.10)    Aspiration Risk  Mild aspiration risk    Diet Recommendation Thin liquid Regular  Liquid Administration via: Cup;Straw Medication Administration: (crush if larger than 7 mm ) Supervision: Patient able to self feed Compensations: Slow rate;Small sips/bites;Follow solids with liquid(start meals with liquids) Postural Changes: Remain upright for at least 30 minutes after po intake;Seated upright at 90 degrees    Other  Recommendations Oral Care Recommendations: Oral care BID   Follow up Recommendations None      Frequency and Duration     n/a       Prognosis    n/a    Swallow Study   General Date of Onset: 12/26/18 HPI: 53 yo male adm to [facility] after fall due to syncope.  PMH + for stage III lung cancer s/p XRT with subsequent radiation esophagitis, odynophagia with PEG placed 12/11/2018.  Pt reports chest/lung radiation is finished and that oncology recommends brain radiation.  Pt reports concerns about this plan and advised he speak to his oncologist about these concerns.  Pt working with dietician to maximize intake/nutrition as he was dehydrated when admitted.  Pt has undergone esophagram 09/2018 and endoscopy 11/2018 but was not dilated due to esophagitis, friability of esophagus.  Pt reports ongoing odynophagia that has not improved since he was scoped.  SLP swallow eval ordered.  Type of Study: Bedside Swallow Evaluation Diet Prior to this Study: Regular;Thin liquids Temperature Spikes Noted: No Respiratory Status: Room air History of Recent Intubation: No Behavior/Cognition: Alert;Cooperative Oral Cavity Assessment: Within Functional Limits Oral Care Completed by SLP: No Oral Cavity - Dentition: Other (Comment)(dentures are at home) Vision: Functional for  self-feeding Self-Feeding Abilities: Able to feed self Patient Positioning: Upright in chair Baseline Vocal Quality: Low vocal intensity(per pt voice is not normal) Volitional Cough: Strong Volitional Swallow: Able to elicit    Oral/Motor/Sensory Function Overall Oral Motor/Sensory Function: Within functional limits   Ice Chips Ice chips: Not tested   Thin Liquid Thin Liquid: Within functional limits Presentation: Cup    Nectar Thick Nectar Thick Liquid: Not tested   Honey Thick Honey Thick Liquid: Not tested   Puree Puree: Not tested   Solid     Solid: Within functional limits Presentation: Self Fed Other Comments: pt only took a few bites, able to [facility] as well as able given dentures are at home, he ceased intake after he felt the food lodged in his upper esophagus      Chales Abrahams 12/26/2018,3:45 PM Donavan Burnet, MS [facility] SLP Acute Rehab Services Pager [phone] Office [phone]

## 2018-12-26 NOTE — Progress Notes (Signed)
Hypoglycemic Event  CBG: 65  Treatment: 8 oz juice/soda  Symptoms: None  Follow-up CBG: Time:2125 CBG Result:79  Possible Reasons for Event: Inadequate meal intake  Comments/MD notified:K Schorr NP made aware, continue to monitor CBGs Q4 for now.    Randa Lynn D

## 2018-12-26 NOTE — H&P (Signed)
History and Physical    JOOD MONJARAZ NGE:[id number] DOB: [birth year] DOA: 12/25/2018  PCP: [name], MD  Patient coming from: Home.  Chief Complaint: Loss of consciousness.  HPI: Andrew West is a 53 y.o. male with history of CAD status post stenting, hypertension, COPD, small cell lung cancer presently on chemo and radiation was brought to the ER after patient had a syncopal episode at a convenience dose.  Patient states over the last few days patient has not been eating well due to poor appetite and also difficulty eating due to pain on eating and swallowing.  Patient states last evening while in the convenience store patient felt dizzy and passed out.  Not sure how long he passed out for.  EMS on arrival patient was found to be hypotensive.  Was brought to the ER.  Patient did not have any chest pain or shortness of breath.  Patient states similar episode happened 2 days ago while he was at home.  ED Course: In the ER patient was hypotensive was given fluid bolus hemoglobin has decreased from his baseline of 9-7.7 creatinine increased from his baseline of 1.4-2.6.  EKG shows normal sinus rhythm.  Patient admitted for syncope likely from symptomatic anemia dehydration and acute renal failure.  2 units of PRBC transfusion has been ordered.  CT head and C-spine unremarkable.  Review of Systems: As per HPI, rest all negative.   Past Medical History:  Diagnosis Date  . Anxiety   . CAD (coronary artery disease)    a. s/p multiple PCIs with last cath 11/2016 with severe multivessel CAD, s/p PCTA to LCx but unable to pass stent  . Chronic leg pain    bilateral  . Chronic lower back pain   . COPD (chronic obstructive pulmonary disease) (HCC)   . Depression   . GERD (gastroesophageal reflux disease)    Takes Dexilant  . HLD (hyperlipidemia)   . Hypertension   . Rhabdomyolysis    h/o, r/t statins  . Sleep apnea    "can't tolerate mask" (12/16/2016)  . Type II diabetes mellitus  (HCC)     Past Surgical History:  Procedure Laterality Date  . BACK SURGERY    . BRONCHIAL BIOPSY  08/25/2018   Procedure: BRONCHIAL BIOPSIES;  Surgeon: Josephine Igo, DO;  Location: [facility];  Service: Cardiopulmonary;;  . CARDIAC CATHETERIZATION N/A 09/25/2015   Procedure: Left Heart Cath and Coronary Angiography;  Surgeon: Marykay Lex, MD;  Location: [facility] INVASIVE CV LAB;  Service: Cardiovascular;  Laterality: N/A;  . CARDIAC CATHETERIZATION N/A 12/16/2016   Procedure: Left Heart Cath and Coronary Angiography;  Surgeon: Marykay Lex, MD;  Location: [facility] INVASIVE CV LAB;  Service: Cardiovascular;  Laterality: N/A;  . CARDIAC CATHETERIZATION N/A 12/16/2016   Procedure: Coronary Balloon Angioplasty;  Surgeon: Marykay Lex, MD;  Location: [facility] INVASIVE CV LAB;  Service: Cardiovascular;  Laterality: N/A;  . COLONOSCOPY W/ POLYPECTOMY    . CORONARY ANGIOPLASTY  09/25/2015   mid cir & om  . CORONARY ANGIOPLASTY WITH STENT PLACEMENT  10/09/2001   PTCA & stenting of mid AV circumflex; 2.5x65mm Pixel stent  . CORONARY ANGIOPLASTY WITH STENT PLACEMENT  12/13/2001   PCI with stent to mid L circumflex, 95% stenosis to 0% residual  . CORONARY ANGIOPLASTY WITH STENT PLACEMENT  10/10/2003   PCI to mid AV circumflex; LAD 30% disease; RCA 100% occluded prox.  . CORONARY ANGIOPLASTY WITH STENT PLACEMENT  09/01/2011   PCI with stenting with  bare metal stent to mid AV groove circumflex and PDA  . CORONARY ANGIOPLASTY WITH STENT PLACEMENT  10/17/2011   cutting balloon angioplasty of ostial lateral OM1 branch and bifurcation AV groove circumflex OM junction; stenosis reduced to 0%  . ENDOBRONCHIAL ULTRASOUND Bilateral 08/25/2018   Procedure: ENDOBRONCHIAL ULTRASOUND;  Surgeon: Josephine Igo, DO;  Location: [facility];  Service: Cardiopulmonary;  Laterality: Bilateral;  . ESOPHAGOGASTRODUODENOSCOPY (EGD) WITH PROPOFOL N/A 12/08/2018   Procedure: ESOPHAGOGASTRODUODENOSCOPY (EGD) WITH PROPOFOL;   Surgeon: Rachael Fee, MD;  Location: [facility];  Service: Endoscopy;  Laterality: N/A;  . EXCISIONAL HEMORRHOIDECTOMY    . FINE NEEDLE ASPIRATION  08/25/2018   Procedure: FINE NEEDLE ASPIRATION;  Surgeon: Josephine Igo, DO;  Location: [facility];  Service: Cardiopulmonary;;  . FLEXIBLE BRONCHOSCOPY  08/25/2018   Procedure: FLEXIBLE BRONCHOSCOPY;  Surgeon: Josephine Igo, DO;  Location: [facility];  Service: Cardiopulmonary;;  . IR GASTROSTOMY TUBE MOD SED  12/11/2018  . IR IMAGING GUIDED PORT INSERTION  09/29/2018  . LEFT HEART CATHETERIZATION WITH CORONARY ANGIOGRAM N/A 10/18/2011   Procedure: LEFT HEART CATHETERIZATION WITH CORONARY ANGIOGRAM;  Surgeon: Marykay Lex, MD;  Location: [facility] CATH LAB;  Service: Cardiovascular;  Laterality: N/A;  . LUMBAR LAMINECTOMY/DECOMPRESSION MICRODISCECTOMY  03/31/2012   Procedure: LUMBAR LAMINECTOMY/DECOMPRESSION MICRODISCECTOMY 1 LEVEL;  Surgeon: Temple Pacini, MD;  Location: [facility] NEURO ORS;  Service: Neurosurgery;  Laterality: Left;  . TRANSTHORACIC ECHOCARDIOGRAM  07/28/2011   EF 55-65%; LVH, grade 1 diastolic dysfunction;      reports that he quit smoking about 3 years ago. His smoking use included cigarettes. He has a 6.25 pack-year smoking history. He has never used smokeless tobacco. He reports that he does not drink alcohol or use drugs.  Allergies  Allergen Reactions  . Iohexol Anaphylaxis    PT. TO BE PREMEDICATED PRIOR TO IV CONTRAST PER DR Eppie Gibson /MMS//12/15/15Desc: PT BECAME SOB AND CHEST TIGHTNESS AFTER CONTRAST INJECTION.  [name],RT-RCT., Onset Date: [id number]     Family History  Problem Relation Age of Onset  . Heart attack Father   . Hypertension Mother   . Diabetes Mother   . Heart disease Brother        x 3   . Heart attack Brother        deceased  . Hypertension Sister   . Diabetes Sister   . Anesthesia problems Neg Hx   . Hypotension Neg Hx   . Malignant hyperthermia Neg Hx   . Pseudochol deficiency Neg Hx      Prior to Admission medications   Medication Sig Start Date End Date Taking? Authorizing Provider  albuterol (PROAIR HFA) 108 (90 Base) MCG/ACT inhaler Inhale 1 puff into the lungs every 6 (six) hours as needed for wheezing or shortness of breath. 12/19/18  Yes [name], MD  amLODipine (NORVASC) 10 MG tablet Take 1 tablet (10 mg total) by mouth daily. 12/19/18  Yes [name], MD  aspirin 81 MG tablet Take 1 tablet (81 mg total) daily by mouth. 10/10/17  Yes Newlin, Enobong, MD  baclofen (LIORESAL) 10 MG tablet Take 1 tablet (10 mg total) by mouth 3 (three) times daily as needed for muscle spasms. 10/11/18  Yes Glade Lloyd, MD  benzonatate (TESSALON) 100 MG capsule Take 1 capsule (100 mg total) by mouth every 8 (eight) hours. Patient taking differently: Take 100 mg by mouth 3 (three) times daily as needed for cough.  09/19/18  Yes Si Gaul, MD  Cholecalciferol (VITAMIN D) 2000 units  tablet Take 1 tablet (2,000 Units total) by mouth daily. 08/04/18  Yes Marcello Fennel, MD  clopidogrel (PLAVIX) 75 MG tablet Take 1 tablet (75 mg total) by mouth daily. YOU MAY RESTART 08/26/2018 12/19/18  Yes [name], MD  diclofenac sodium (VOLTAREN) 1 % GEL Apply 2 g topically 4 (four) times daily. 04/20/18  Yes [name], MD  dronabinol (MARINOL) 5 MG capsule Take 1 capsule (5 mg total) by mouth 2 (two) times daily before a meal. 10/18/18  Yes Margaretmary Dys, MD  FLUoxetine (PROZAC) 40 MG capsule Take 1 capsule (40 mg total) by mouth daily. 12/19/18  Yes Newlin, Odette Horns, MD  Fluticasone-Salmeterol (ADVAIR) 100-50 MCG/DOSE AEPB Inhale 1 puff into the lungs 2 (two) times daily. 12/19/18  Yes [name], MD  hydrALAZINE (APRESOLINE) 25 MG tablet Take 1 tablet (25 mg total) by mouth 2 (two) times daily. 12/19/18  Yes [name], MD  HYDROcodone-acetaminophen (NORCO) 5-325 MG tablet Take 1 tablet by mouth every 6 (six) hours as needed for moderate pain. 12/21/18  Yes Si Gaul, MD  isosorbide mononitrate (IMDUR) 120 MG 24 hr tablet Take 1 tablet (120 mg total) by mouth daily. 12/19/18  Yes [name], MD  LORazepam (ATIVAN) 0.5 MG tablet 1 tab po q 4-6 hours prn or 1 tab po 30 minutes prior to radiation 08/30/18  Yes Ronny Bacon, PA-C  magic mouthwash w/lidocaine SOLN Take 10 mLs by mouth 3 (three) times daily as needed for up to 20 days for mouth pain. 12/13/18 01/02/19 Yes Purohit, Salli Quarry, MD  mirtazapine (REMERON) 30 MG tablet Take 1 tablet (30 mg total) by mouth at bedtime. 08/31/18  Yes Si Gaul, MD  Nebivolol HCl 20 MG TABS Take 1 tablet (20 mg total) by mouth daily. 12/19/18  Yes Newlin, Odette Horns, MD  ondansetron (ZOFRAN) 8 MG tablet Take 1 tablet (8 mg total) by mouth every 8 (eight) hours as needed for nausea or vomiting. 09/20/18  Yes Si Gaul, MD  pantoprazole (PROTONIX) 40 MG tablet Take 1 tablet (40 mg total) by mouth daily. 12/19/18  Yes [name], MD  potassium chloride 20 MEQ TBCR Take 10 mEq by mouth daily. 11/13/18  Yes Si Gaul, MD  prazosin (MINIPRESS) 1 MG capsule Take 1 capsule (1 mg total) by mouth at bedtime. For nightmares 07/25/18  Yes [name], MD  prochlorperazine (COMPAZINE) 10 MG tablet Take 1 tablet (10 mg total) by mouth every 6 (six) hours as needed for nausea or vomiting. 10/23/18  Yes Si Gaul, MD  promethazine (PHENERGAN) 25 MG tablet Take 1 tablet (25 mg total) by mouth every 6 (six) hours as needed for nausea or vomiting. 10/22/18  Yes Melene Plan, DO  ranolazine (RANEXA) 1000 MG SR tablet Take 1 tablet (1,000 mg total) by mouth 2 (two) times daily. 05/24/17  Yes Hilty, Lisette Abu, MD  rosuvastatin (CRESTOR) 20 MG tablet Take 1 tablet (20 mg total) by mouth daily. 12/19/18  Yes [name], MD  sucralfate (CARAFATE) 1 g tablet Take 1 tablet by mouth 4 (four) times daily -  before meals and at bedtime. 09/23/18  Yes [provider]  tiotropium (SPIRIVA HANDIHALER) 18 MCG  inhalation capsule Place 1 capsule (18 mcg total) into inhaler and inhale daily. 12/19/18  Yes [name], MD  vitamin B-12 (CYANOCOBALAMIN) 500 MCG tablet Take 1 tablet (500 mcg total) by mouth daily. 06/30/18  Yes Marcello Fennel, MD  zolpidem (AMBIEN) 5 MG tablet Take 1 tablet (5 mg total)  by mouth at bedtime. 12/14/18  Yes [name], MD  Blood Glucose Monitoring Suppl (ACCU-CHEK AVIVA PLUS) w/Device KIT Use as dircted 07/12/17   [name], MD  fluticasone (FLONASE) 50 MCG/ACT nasal spray Place 2 sprays into both nostrils daily as needed for allergies. Patient not taking: Reported on 12/25/2018 10/11/18   Glade Lloyd, MD  glucose blood (ACCU-CHEK AVIVA) test strip Use as instructed 07/12/17   [name], MD  hydrocortisone-pramoxine [facility]) 2.5-1 % rectal cream Place 1 application rectally as needed. Patient not taking: Reported on 12/25/2018 10/11/18   Glade Lloyd, MD  lidocaine-prilocaine (EMLA) cream Apply 1 application topically as needed. Apply 1 tsp on skin over port site one hour prior to chemotherapy. Cover with plastic wrap. Do not rub in medication. Patient not taking: Reported on 12/25/2018 11/27/18   Si Gaul, MD  nicotine (NICODERM CQ) 21 mg/24hr patch Place 1 patch (21 mg total) onto the skin daily. Patient not taking: Reported on 12/25/2018 08/31/18   Si Gaul, MD  nitroGLYCERIN (NITROSTAT) 0.4 MG SL tablet Place 1 tablet (0.4 mg total) under the tongue every 5 (five) minutes as needed for chest pain. Patient taking differently: Place 0.4 mg under the tongue every 5 (five) minutes as needed for chest pain. Chest pain 12/24/16   Ellsworth Lennox, [place]    Physical Exam: Vitals:   12/25/18 2315 12/25/18 2330 12/25/18 2345 12/26/18 0000  BP: 106/62 103/67 107/64 104/67  Pulse: 88 88 89 87  Resp: 18 17 (!) 22 15  Temp:      TempSrc:      SpO2: 97% 98% 100% 97%      Constitutional: Moderately built and nourished. Vitals:   12/25/18 2315  12/25/18 2330 12/25/18 2345 12/26/18 0000  BP: 106/62 103/67 107/64 104/67  Pulse: 88 88 89 87  Resp: 18 17 (!) 22 15  Temp:      TempSrc:      SpO2: 97% 98% 100% 97%   Eyes: Anicteric no pallor. ENMT: No discharge from the ears eyes nose and mouth. Neck: No mass felt.  No neck rigidity. Respiratory: No rhonchi or crepitations. Cardiovascular: S1-S2 heard. Abdomen: Soft nontender bowel sounds present. Musculoskeletal: No edema.  No joint effusion. Skin: No rash. Neurologic: Alert awake oriented to time place and person.  Moves all extremities 5 x 5.  No facial asymmetry. Psychiatric: Appears normal.   Labs on Admission: I have personally reviewed following labs and imaging studies  CBC: Recent Labs  Lab 12/25/18 0810 12/25/18 1944  WBC 6.8 7.9  NEUTROABS 5.2  --   HGB 9.0* 7.7*  HCT 28.3* 24.7*  MCV 89.8 95.0  PLT 202 195   Basic Metabolic Panel: Recent Labs  Lab 12/25/18 0810 12/25/18 1944  NA 143 138  K 4.1 3.9  CL 103 101  CO2 28 27  GLUCOSE 87 135*  BUN 29* 33*  CREATININE 2.60* 2.88*  CALCIUM 9.6 8.9  MG 1.8  --    GFR: Estimated Creatinine Clearance: 30 mL/min (A) (by C-G formula based on SCr of 2.88 mg/dL (H)). Liver Function Tests: Recent Labs  Lab 12/25/18 0810 12/25/18 2029  AST 13* 14*  ALT 10 11  ALKPHOS 67 52  BILITOT 0.6 0.4  PROT 7.3 6.8  ALBUMIN 3.4* 3.2*   No results for input(s): LIPASE, AMYLASE in the last 168 hours. No results for input(s): AMMONIA in the last 168 hours. Coagulation Profile: No results for input(s): INR, PROTIME in the last 168 hours. Cardiac  Enzymes: Recent Labs  Lab 12/25/18 2054  TROPONINI <0.03   BNP (last 3 results) No results for input(s): PROBNP in the last 8760 hours. HbA1C: No results for input(s): HGBA1C in the last 72 hours. CBG: Recent Labs  Lab 12/25/18 1932  GLUCAP 125*   Lipid Profile: No results for input(s): CHOL, HDL, LDLCALC, TRIG, CHOLHDL, LDLDIRECT in the last 72  hours. Thyroid Function Tests: No results for input(s): TSH, T4TOTAL, FREET4, T3FREE, THYROIDAB in the last 72 hours. Anemia Panel: No results for input(s): VITAMINB12, FOLATE, FERRITIN, TIBC, IRON, RETICCTPCT in the last 72 hours. Urine analysis:    Component Value Date/Time   COLORURINE YELLOW 12/02/2018 0018   APPEARANCEUR HAZY (A) 12/02/2018 0018   LABSPEC 1.014 12/02/2018 0018   PHURINE 5.0 12/02/2018 0018   GLUCOSEU NEGATIVE 12/02/2018 0018   GLUCOSEU NEGATIVE 12/30/2008 1531   HGBUR NEGATIVE 12/02/2018 0018   BILIRUBINUR NEGATIVE 12/02/2018 0018   BILIRUBINUR small 10/10/2017 1455   KETONESUR 5 (A) 12/02/2018 0018   PROTEINUR NEGATIVE 12/02/2018 0018   UROBILINOGEN 1.0 10/10/2017 1455   UROBILINOGEN 1.0 01/23/2014 2333   NITRITE NEGATIVE 12/02/2018 0018   LEUKOCYTESUR NEGATIVE 12/02/2018 0018   Sepsis Labs: @LABRCNTIP (procalcitonin:4,lacticidven:4) )No results found for this or any previous visit (from the past 240 hour(s)).   Radiological Exams on Admission: Ct Head Wo Contrast  Result Date: 12/25/2018 CLINICAL DATA:  Syncope, headache, history of lung cancer EXAM: CT HEAD WITHOUT CONTRAST CT CERVICAL SPINE WITHOUT CONTRAST TECHNIQUE: Multidetector CT imaging of the head and cervical spine was performed following the standard protocol without intravenous contrast. Multiplanar CT image reconstructions of the cervical spine were also generated. COMPARISON:  MR brain 09/06/2018 FINDINGS: CT HEAD FINDINGS Brain: No evidence of acute infarction, hemorrhage, hydrocephalus, extra-axial collection or mass lesion/mass effect. Vascular: No hyperdense vessel or unexpected calcification. Skull: No osseous abnormality. Sinuses/Orbits: Visualized paranasal sinuses are clear. Visualized mastoid sinuses are clear. Visualized orbits demonstrate no focal abnormality. Other: None CT CERVICAL SPINE FINDINGS Alignment: Normal. Skull base and vertebrae: No acute fracture. No primary bone lesion or  focal pathologic process. Soft tissues and spinal canal: No prevertebral fluid or swelling. No visible canal hematoma. Disc levels: Degenerative disc disease with disc height loss at C5-6 and C6-7. Mild broad-based disc osteophyte complex at C5-6. No foraminal stenosis. Upper chest: Lung apices are clear. Other: No fluid collection or hematoma. IMPRESSION: 1. No acute intracranial pathology. 2.  No acute osseous injury of the cervical spine. Electronically Signed   By: Elige Ko   On: 12/25/2018 21:09   Ct Cervical Spine Wo Contrast  Result Date: 12/25/2018 CLINICAL DATA:  Syncope, headache, history of lung cancer EXAM: CT HEAD WITHOUT CONTRAST CT CERVICAL SPINE WITHOUT CONTRAST TECHNIQUE: Multidetector CT imaging of the head and cervical spine was performed following the standard protocol without intravenous contrast. Multiplanar CT image reconstructions of the cervical spine were also generated. COMPARISON:  MR brain 09/06/2018 FINDINGS: CT HEAD FINDINGS Brain: No evidence of acute infarction, hemorrhage, hydrocephalus, extra-axial collection or mass lesion/mass effect. Vascular: No hyperdense vessel or unexpected calcification. Skull: No osseous abnormality. Sinuses/Orbits: Visualized paranasal sinuses are clear. Visualized mastoid sinuses are clear. Visualized orbits demonstrate no focal abnormality. Other: None CT CERVICAL SPINE FINDINGS Alignment: Normal. Skull base and vertebrae: No acute fracture. No primary bone lesion or focal pathologic process. Soft tissues and spinal canal: No prevertebral fluid or swelling. No visible canal hematoma. Disc levels: Degenerative disc disease with disc height loss at C5-6 and C6-7. Mild broad-based disc  osteophyte complex at C5-6. No foraminal stenosis. Upper chest: Lung apices are clear. Other: No fluid collection or hematoma. IMPRESSION: 1. No acute intracranial pathology. 2.  No acute osseous injury of the cervical spine. Electronically Signed   By: Elige Ko    On: 12/25/2018 21:09    EKG: Independently reviewed.  Normal sinus rhythm.  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 Syncope likely from dehydration symptomatic anemia and hypotension could be from dehydration and poor oral intake -patient is receiving 2 units of packed red blood transfusion for worsening hemoglobin.  Stool for occult blood was negative.  We will continue with gentle hydration hold antihypertensives.  Follow CBC metabolic panel in the a.m.  Monitor in telemetry given history of CAD. 2. Acute renal failure likely from poor oral intake and hypotension from dehydration -hold antihypertensive receiving blood at this time.  Recheck metabolic panel in a.m. 3. Anemia in the setting of chemotherapy for lung cancer -follow CBC after transfusion.  No obvious GI bleed. 4. CAD denies any chest pain.  We will continue antiplatelet agent statins beta-blocker. 5. Hypertension was hypotensive on presentation will hold amlodipine Imdur Minipress but will continue beta-blocker blood pressure allowS. 6. Small cell lung cancer being followed by Dr. [name].  Last chemotherapy was around 3 weeks ago along with radiation. 7. Diabetes mellitus type 2 mention in the chart but patient states he does not take any medications. 8. COPD not actively wheezing.   DVT prophylaxis: Lovenox. Code Status: Full code. Family Communication: Discussed with patient. Disposition Plan: Home. Consults called: None. Admission status: Observation.   Eduard Clos MD [facility] Pager [phone].  If 7PM-7AM, please contact  night-coverage [email or web address] Password [email or web address]  12/26/2018, 12:09 AM

## 2018-12-26 NOTE — Progress Notes (Signed)
Pt vomited immediately after receiving tube feeding and flush. Gave IV Zofran per PRN order. Dr. Tana Coast paged and made aware.

## 2018-12-26 NOTE — Care Management Obs Status (Signed)
[facility] NOTIFICATION   Patient Details  Name: Andrew West MRN: [id number] Date of Birth: [birth year]   Medicare Observation Status Notification Given:  Yes    Leeroy Cha, RN 12/26/2018, 11:28 AM

## 2018-12-26 NOTE — Progress Notes (Signed)
Initial Nutrition Assessment  DOCUMENTATION CODES:   Severe malnutrition in context of chronic illness  INTERVENTION:   Change home regimen to   -TwoCal HN x 5 cans daily (237 ml each) -30 ml Prostat or other protein modular BID -Free water flushes 150 ml before and after each feeding -PO intake as tolerated  Provides: 2575  kcals, 130 grams protein, 830 ml free water (2330 ml with flushes).   Pt has not followed up with [place] RD. Recommend pt to be followed outpatient due to continued wt loss.   NUTRITION DIAGNOSIS:   Severe Malnutrition related to chronic illness, cancer and cancer related treatments as evidenced by energy intake < or equal to 75% for > or equal to 1 month, percent weight loss, mild fat depletion, moderate muscle depletion.  GOAL:   Patient will meet greater than or equal to 90% of their needs  MONITOR:   PO intake, Supplement acceptance, Diet advancement, Labs, Weight trends, I & O's  REASON FOR ASSESSMENT:   Malnutrition Screening Tool    ASSESSMENT:   Patient with PMH significant for CAD s/p stenting, HTN, COPD, small cell lung cancer on chemotherapy/radiation. Pt recently admitted for poor oral intake last month and is s/p PEG placement on 1/20. Presents this admission with syncope likely from dehydration/poor oral intake and AKI.    During last admission pt was instructed to follow home regimen of:   -Osmolite 1.5 240 ml QID via PEG -Infuse Osmolite 1.5 @ 70 ml/hr x 12 hours at night.  -Free water flushes of 120 ml before and after each bolus and infusion (total: 1235ml)  Pt endorses he used the pump the first day it was sent to his house but not since. Pt administers 2-2.5 cans of Osmolite 1.5 on most days. He reports eating half a meal daily that consist of mashed potatoes, baked chicken, or baked fish. States after he eats he is too bloated to administer TF.  Over the last week he noticed having issues upon swallowing. He describes it  as " I feel like there is a pocket in my esophagus and food gets stuck." States that his "mouthwash" helps him sometimes (nystatin?). SLP to see. He is able to drink one Ensure daily. He complains of ongoing nausea. Denies taste changes.   After long discussion with pt, plan to change regimen to an even more calorically dense formula in attempts to decrease amount of bolus feedings he has to administer. RD stressed the importance of hydration with free water flushes and by PO intake. If bloating continues recommend addition of prokinetic agent.   Pt's UBW stays around 255 lb, the last time being at that weight 5 months ago. Records indicate pt weighed 249 lb on 09/14/18 and 178 lb this admission (28.5% wt loss in 4 months, significant for time). Pt continues to lose weight. Nutrition-Focused physical exam completed. Pt has transitioned to being severely malnourished.   Medications reviewed and include: marinol BID, Remeron once daily, 10 mEq KCl once daily, Vit B12, NS @ 125 ml/hr  Labs reviewed.   NUTRITION - FOCUSED PHYSICAL EXAM:    Most Recent Value  Orbital Region  No depletion  Upper Arm Region  Mild depletion  Thoracic and Lumbar Region  Unable to assess  Buccal Region  Mild depletion  Temple Region  Moderate depletion  Clavicle Bone Region  Moderate depletion  Clavicle and Acromion Bone Region  Mild depletion  Scapular Bone Region  Unable to assess  Dorsal Hand  No depletion  Patellar Region  Moderate depletion  Anterior Thigh Region  Moderate depletion  Posterior Calf Region  Moderate depletion  Edema (RD Assessment)  None     Diet Order:   Diet Order            Diet Heart Room service appropriate? Yes; Fluid consistency: Thin  Diet effective now              EDUCATION NEEDS:   Education needs have been addressed  Skin:  Skin Assessment: Reviewed RN Assessment  Last BM:  2/3  Height:   Ht Readings from Last 1 Encounters:  12/26/18 5\' 9"  (1.753 m)    Weight:    Wt Readings from Last 1 Encounters:  12/26/18 81.9 kg    Ideal Body Weight:  72.7 kg  BMI:  Body mass index is 26.66 kg/m.  Estimated Nutritional Needs:   Kcal:  2500-2700 kcal  Protein:  125-140 grams  Fluid:  >/= 2.5 L/day   Mariana Single RD, LDN Clinical Nutrition Pager # - [phone]

## 2018-12-26 NOTE — Progress Notes (Signed)
Seen and examined, admitted by Dr. Hal Hope this morning  Briefly 53 year old male with CAD, hypertension, COPD, small cell lung cancer presently on chemo and radiation, saw Dr. Julien Nordmann yesterday in clinic.  Subsequently patient had a syncopal episode at the convenience store.  Patient reported that he had not been eating well in the last few days due to poor appetite, difficulty eating due to swallowing. On arrival, EMS found patient to be hypotensive.  Patient reported similar episode 2 days prior to admission. Creatinine was increased from his baseline of 1.4 to 2.8.  Hemoglobin 7.7, baseline ~9  BP 119/84 (BP Location: Right Arm)   Pulse 90   Temp 97.9 F (36.6 C) (Oral)   Resp 16   Ht 5\' 9"  (1.753 m)   Wt 81.9 kg   SpO2 95%   BMI 26.66 kg/m   A/p  Due to dehydration, symptomatic anemia and hypotension -Poor oral intake in the last few days as well as swallowing issues, patient reported that he has been having swallowing issues since radiation - will obtain SLP evaluation, continue IV fluid hydration -Creatinine improved to 2.4, baseline around 1.4  Anemia in the setting of chemotherapy for lung CA -Received 2 units packed RBCs, hemoglobin 10, baseline around 9 -PT OT evaluation, orthostatics  Hopefully will be able to DC home in a.m. if creatinine improving and no acute issues overnight.   Estill Cotta M.D. [facility] 12/26/2018, 11:53 AM  Pager: [phone]

## 2018-12-27 ENCOUNTER — Ambulatory Visit: Payer: Medicare Other

## 2018-12-27 LAB — TYPE AND SCREEN
ABO/RH(D): A POS
Antibody Screen: NEGATIVE
Unit division: 0
Unit division: 0

## 2018-12-27 LAB — CBC
HCT: 29.9 % — ABNORMAL LOW (ref 39.0–52.0)
Hemoglobin: 9.6 g/dL — ABNORMAL LOW (ref 13.0–17.0)
MCH: 30.2 pg (ref 26.0–34.0)
MCHC: 32.1 g/dL (ref 30.0–36.0)
MCV: 94 fL (ref 80.0–100.0)
Platelets: 185 10*3/uL (ref 150–400)
RBC: 3.18 MIL/uL — ABNORMAL LOW (ref 4.22–5.81)
RDW: 15.8 % — AB (ref 11.5–15.5)
WBC: 4.7 10*3/uL (ref 4.0–10.5)
nRBC: 0 % (ref 0.0–0.2)

## 2018-12-27 LAB — BPAM RBC
Blood Product Expiration Date: 202002262359
Blood Product Expiration Date: 202002272359
ISSUE DATE / TIME: 202002040107
ISSUE DATE / TIME: 202002040431
Unit Type and Rh: 6200
Unit Type and Rh: 6200

## 2018-12-27 LAB — GLUCOSE, CAPILLARY
Glucose-Capillary: 65 mg/dL — ABNORMAL LOW (ref 70–99)
Glucose-Capillary: 67 mg/dL — ABNORMAL LOW (ref 70–99)
Glucose-Capillary: 100 mg/dL — ABNORMAL HIGH (ref 70–99)
Glucose-Capillary: 113 mg/dL — ABNORMAL HIGH (ref 70–99)
Glucose-Capillary: 129 mg/dL — ABNORMAL HIGH (ref 70–99)
Glucose-Capillary: 94 mg/dL (ref 70–99)
Glucose-Capillary: 73 mg/dL (ref 70–99)
Glucose-Capillary: 123 mg/dL — ABNORMAL HIGH (ref 70–99)

## 2018-12-27 LAB — BASIC METABOLIC PANEL
Anion gap: 3 — ABNORMAL LOW (ref 5–15)
BUN: 25 mg/dL — ABNORMAL HIGH (ref 6–20)
CALCIUM: 8.5 mg/dL — AB (ref 8.9–10.3)
CO2: 26 mmol/L (ref 22–32)
Chloride: 114 mmol/L — ABNORMAL HIGH (ref 98–111)
Creatinine, Ser: 1.97 mg/dL — ABNORMAL HIGH (ref 0.61–1.24)
GFR calc Af Amer: 44 mL/min — ABNORMAL LOW (ref >60)
GFR calc non Af Amer: 38 mL/min — ABNORMAL LOW (ref >60)
Glucose, Bld: 99 mg/dL (ref 70–99)
Potassium: 3.9 mmol/L (ref 3.5–5.1)
SODIUM: 143 mmol/L (ref 135–145)

## 2018-12-27 LAB — PHOSPHORUS
Phosphorus: 3.1 mg/dL (ref 2.5–4.6)
Phosphorus: 2.8 mg/dL (ref 2.5–4.6)

## 2018-12-27 LAB — MAGNESIUM
Magnesium: 1.8 mg/dL (ref 1.7–2.4)
Magnesium: 1.7 mg/dL (ref 1.7–2.4)

## 2018-12-27 MED ORDER — SUCRALFATE 1 GM/10ML PO SUSP
1.0000 g | Freq: Three times a day (TID) | ORAL | Status: DC
  Administered 2018-12-27 – 2018-12-28 (×2): 1 g
  Filled 2018-12-27 (×2): qty 10

## 2018-12-27 MED ORDER — NEBIVOLOL HCL 5 MG PO TABS
5.0000 mg | ORAL_TABLET | Freq: Every day | ORAL | Status: DC
  Administered 2018-12-27 – 2018-12-28 (×2): 5 mg via ORAL
  Filled 2018-12-27 (×2): qty 1

## 2018-12-27 NOTE — Progress Notes (Signed)
Patient requested something for indigestion. Lamar Blinks NP notified. Orders received for maalox/lidocaine oral medications. When discussing with patient he stated "I already took something my wife gave me." This RN educated patient not to take medications from home during hospital stay and to only take what is ordered for him to take here, for his safety. He verbalized understanding. Maalox/lidocaine not given. Will continue to monitor.

## 2018-12-27 NOTE — Progress Notes (Signed)
PROGRESS NOTE  Andrew West:[id number] DOB: 1966-10-06 DOA: 12/25/2018 PCP: [name], MD  HPI/Recap of past 24 hours: Andrew West is a 53 y.o. male with history of CAD status post stenting, hypertension, COPD, small cell lung cancer presently on chemo and radiation was brought to the ER after patient had a syncopal episode at a convenience dose.  Patient states over the last few days patient has not been eating well due to poor appetite and also difficulty eating due to pain on eating and swallowing.  Patient states last evening while in the convenience store patient felt dizzy and passed out.  Not sure how long he passed out for.  EMS on arrival patient was found to be hypotensive.  Was brought to the ER.  Patient did not have any chest pain or shortness of breath.  Patient states similar episode happened 2 days ago while he was at home.  ED Course: In the ER patient was hypotensive was given fluid bolus hemoglobin has decreased from his baseline of 9-7.7 creatinine increased from his baseline of 1.4-2.6.  EKG shows normal sinus rhythm.  Patient admitted for syncope likely from symptomatic anemia dehydration and acute renal failure.  2 units of PRBC transfusion has been ordered.  CT head and C-spine unremarkable.  12/27/2018: Patient seen and examined with his wife at bedside.  Denies dizziness, chest pain, dyspnea, nausea or vomiting.  States he wants to go home.  Patient starting on new regimen for feeding tube.  Was having intractable vomiting with previous bolus tube feeding despite antiemetics.  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)   Type 2 diabetes mellitus (HCC)   Sleep apnea   CKD (chronic kidney disease) stage 3, GFR 30-59 ml/min (HCC)   Syncope  Protein-calorie malnutrition, severe  Syncope likely from dehydration versus symptomatic anemia versus hypotension Dehydration is improving with IV fluid hydration Symptomatic anemia is also improving post 2 unit PRBC transfusion Orthostatic vital signs essentially unremarkable Continue to closely monitor  CKD 3 Baseline creatinine appears to be 1.9 Presented with creatinine of 2.41 Improved post IV fluid hydration We will follow for toxic agents/dehydration/hypotension Monitor urine output Repeat BMP in the morning  Anemia of chronic disease Low hemoglobin on presentation Transfused 2 unit PRBC Negative FOBT Hemoglobin is now stable No sign of overt bleeding Repeat CBC in the morning  Small cell lung carcinoma, followed by Dr. Shirline Frees Last chemotherapy was around 3 weeks ago with radiation  COPD Stable  Coronary artery disease status post PCI with stent Denies chest pain  Hypoglycemia in the setting of impaired glucose tolerance Last hemoglobin A1c 5.7 on 08/21/2018 Repeat hemoglobin A1c Continue tube feeding     DVT prophylaxis:  Subcu Lovenox daily. Code Status: Full code. Family Communication: Discussed with patient and his wife on 12/27/2018 Disposition Plan: Home when clinically stable. Consults called: None.   Objective: Vitals:   12/27/18 0709 12/27/18 0710 12/27/18 0900 12/27/18 1403  BP: (!) 126/94 (!) 117/97  113/89  Pulse: 76 83  75  Resp:    14  Temp:    98.3 F (36.8 C)  TempSrc:    Oral  SpO2: 99% 100% 97% 99%  Weight:      Height:  Intake/Output Summary (Last 24 hours) at 12/27/2018 1752 Last data filed at 12/27/2018 0600 Gross per 24 hour  Intake 2733.82 ml  Output 650 ml  Net 2083.82 ml   Filed Weights   12/26/18 0759 12/26/18 1149 12/27/18 0500  Weight: 80.8 kg 81.9 kg 84.4 kg    Exam:  . General: 53 y.o. year-old male well developed well nourished in no acute distress.  Alert and oriented x3. . Cardiovascular: Regular  rate and rhythm with no rubs or gallops.  No thyromegaly or JVD noted.   Marland Kitchen Respiratory: Mild rales at bases with no wheezes. Good inspiratory effort. . Abdomen: Soft nontender nondistended with normal bowel sounds x4 quadrants.  Tube feeding in place. . Musculoskeletal: No lower extremity edema. 2/4 pulses in all 4 extremities. Marland Kitchen Psychiatry: Mood is appropriate for condition and setting   Data Reviewed: CBC: Recent Labs  Lab 12/25/18 0810 12/25/18 1944 12/26/18 1005 12/27/18 0319  WBC 6.8 7.9 8.4 4.7  NEUTROABS 5.2  --   --   --   HGB 9.0* 7.7* 10.1* 9.6*  HCT 28.3* 24.7* 31.3* 29.9*  MCV 89.8 95.0 92.6 94.0  PLT 202 195 175 185   Basic Metabolic Panel: Recent Labs  Lab 12/25/18 0810 12/25/18 1944 12/26/18 1005 12/27/18 0319  NA 143 138 140 143  K 4.1 3.9 4.0 3.9  CL 103 101 107 114*  CO2 28 27 25 26   GLUCOSE 87 135* 86 99  BUN 29* 33* 30* 25*  CREATININE 2.60* 2.88* 2.41* 1.97*  CALCIUM 9.6 8.9 8.7* 8.5*  MG 1.8  --  1.9 1.8  PHOS  --   --  3.4 3.1   GFR: Estimated Creatinine Clearance: 43.9 mL/min (A) (by C-G formula based on SCr of 1.97 mg/dL (H)). Liver Function Tests: Recent Labs  Lab 12/25/18 0810 12/25/18 2029  AST 13* 14*  ALT 10 11  ALKPHOS 67 52  BILITOT 0.6 0.4  PROT 7.3 6.8  ALBUMIN 3.4* 3.2*   No results for input(s): LIPASE, AMYLASE in the last 168 hours. No results for input(s): AMMONIA in the last 168 hours. Coagulation Profile: No results for input(s): INR, PROTIME in the last 168 hours. Cardiac Enzymes: Recent Labs  Lab 12/25/18 2054 12/26/18 1005  TROPONINI <0.03 <0.03   BNP (last 3 results) No results for input(s): PROBNP in the last 8760 hours. HbA1C: No results for input(s): HGBA1C in the last 72 hours. CBG: Recent Labs  Lab 12/27/18 0610 12/27/18 0705 12/27/18 0816 12/27/18 1306 12/27/18 1610  GLUCAP 67* 100* 113* 129* 94   Lipid Profile: No results for input(s): CHOL, HDL, LDLCALC, TRIG, CHOLHDL, LDLDIRECT in the  last 72 hours. Thyroid Function Tests: No results for input(s): TSH, T4TOTAL, FREET4, T3FREE, THYROIDAB in the last 72 hours. Anemia Panel: No results for input(s): VITAMINB12, FOLATE, FERRITIN, TIBC, IRON, RETICCTPCT in the last 72 hours. Urine analysis:    Component Value Date/Time   COLORURINE YELLOW 12/26/2018 0117   APPEARANCEUR CLEAR 12/26/2018 0117   LABSPEC 1.012 12/26/2018 0117   PHURINE 6.0 12/26/2018 0117   GLUCOSEU NEGATIVE 12/26/2018 0117   GLUCOSEU NEGATIVE 12/30/2008 1531   HGBUR NEGATIVE 12/26/2018 0117   BILIRUBINUR NEGATIVE 12/26/2018 0117   BILIRUBINUR small 10/10/2017 1455   KETONESUR 5 (A) 12/26/2018 0117   PROTEINUR NEGATIVE 12/26/2018 0117   UROBILINOGEN 1.0 10/10/2017 1455   UROBILINOGEN 1.0 01/23/2014 2333   NITRITE NEGATIVE 12/26/2018 0117   LEUKOCYTESUR NEGATIVE 12/26/2018 0117   Sepsis Labs: @LABRCNTIP (procalcitonin:4,lacticidven:4)  )No results found  for this or any previous visit (from the past 240 hour(s)).    Studies: No results found.  Scheduled Meds: . alum & mag hydroxide-simeth  30 mL Oral Once   And  . lidocaine  15 mL Oral Once  . aspirin EC  81 mg Oral Daily  . clopidogrel  75 mg Oral Daily  . dronabinol  5 mg Oral BID AC  . enoxaparin (LOVENOX) injection  40 mg Subcutaneous Daily  . feeding supplement (PRO-STAT SUGAR FREE 64)  30 mL Per Tube BID  . FLUoxetine  40 mg Oral Daily  . mirtazapine  30 mg Oral QHS  . mometasone-formoterol  2 puff Inhalation BID  . nebivolol  5 mg Oral Daily  . pantoprazole  40 mg Oral Daily  . potassium chloride  10 mEq Oral Daily  . ranolazine  1,000 mg Oral BID  . rosuvastatin  20 mg Oral q1800  . sodium chloride flush  10-40 mL Intracatheter Q12H  . sucralfate  1 g Oral TID AC & HS  . TWOCAL HN  237 mL Per Tube 5 X Daily  . umeclidinium bromide  1 puff Inhalation Daily  . vitamin B-12  500 mcg Oral Daily  . zolpidem  5 mg Oral QHS    Continuous Infusions:   LOS: 1 day     Darlin Drop, MD [facility] Pager [phone]  If 7PM-7AM, please contact night-coverage [email or web address] Password [email or web address] 12/27/2018, 5:52 PM

## 2018-12-27 NOTE — Progress Notes (Signed)
Patient unable to tolerate Ranexa by mouth this evening, unable to be crushed per pharmacy. K. Schorr NP made aware. CBG at 8pm was 73. Tube feeds given afterwards and will continue to monitor patients blood sugar throughout the night. Patient is comfortable and feels no symptoms of hypoglycemia at this time. Wife at bedside. Will continue with plan of care.

## 2018-12-28 ENCOUNTER — Telehealth: Payer: Self-pay | Admitting: Internal Medicine

## 2018-12-28 ENCOUNTER — Ambulatory Visit: Payer: Medicare Other | Admitting: Internal Medicine

## 2018-12-28 LAB — BASIC METABOLIC PANEL
Anion gap: 4 — ABNORMAL LOW (ref 5–15)
BUN: 27 mg/dL — AB (ref 6–20)
CO2: 27 mmol/L (ref 22–32)
Calcium: 8.3 mg/dL — ABNORMAL LOW (ref 8.9–10.3)
Chloride: 113 mmol/L — ABNORMAL HIGH (ref 98–111)
Creatinine, Ser: 1.64 mg/dL — ABNORMAL HIGH (ref 0.61–1.24)
GFR calc Af Amer: 55 mL/min — ABNORMAL LOW (ref >60)
GFR calc non Af Amer: 47 mL/min — ABNORMAL LOW (ref >60)
Glucose, Bld: 72 mg/dL (ref 70–99)
Potassium: 4.2 mmol/L (ref 3.5–5.1)
Sodium: 144 mmol/L (ref 135–145)

## 2018-12-28 LAB — CBC
HCT: 31.5 % — ABNORMAL LOW (ref 39.0–52.0)
HEMOGLOBIN: 9.9 g/dL — AB (ref 13.0–17.0)
MCH: 29.5 pg (ref 26.0–34.0)
MCHC: 31.4 g/dL (ref 30.0–36.0)
MCV: 93.8 fL (ref 80.0–100.0)
Platelets: 190 10*3/uL (ref 150–400)
RBC: 3.36 MIL/uL — ABNORMAL LOW (ref 4.22–5.81)
RDW: 15.7 % — ABNORMAL HIGH (ref 11.5–15.5)
WBC: 4.8 10*3/uL (ref 4.0–10.5)
nRBC: 0 % (ref 0.0–0.2)

## 2018-12-28 LAB — MAGNESIUM: Magnesium: 1.8 mg/dL (ref 1.7–2.4)

## 2018-12-28 LAB — GLUCOSE, CAPILLARY
Glucose-Capillary: 123 mg/dL — ABNORMAL HIGH (ref 70–99)
Glucose-Capillary: 125 mg/dL — ABNORMAL HIGH (ref 70–99)
Glucose-Capillary: 112 mg/dL — ABNORMAL HIGH (ref 70–99)
Glucose-Capillary: 63 mg/dL — ABNORMAL LOW (ref 70–99)

## 2018-12-28 LAB — PHOSPHORUS: Phosphorus: 2.5 mg/dL (ref 2.5–4.6)

## 2018-12-28 MED ORDER — STERILE WATER PO LIQD: 30.0000 | Freq: Every day | ORAL | 0 refills | Status: DC

## 2018-12-28 MED ORDER — SENNOSIDES-DOCUSATE SODIUM 8.6-50 MG PO TABS: 2.0000 | ORAL_TABLET | Freq: Every day | ORAL | 0 refills | Status: DC

## 2018-12-28 MED ORDER — SENNOSIDES-DOCUSATE SODIUM 8.6-50 MG PO TABS: 2.0000 | ORAL_TABLET | Freq: Every day | ORAL | Status: DC

## 2018-12-28 MED ORDER — HEPARIN SOD (PORK) LOCK FLUSH 100 UNIT/ML IV SOLN
500.0000 [IU] | INTRAVENOUS | Status: AC | PRN
  Administered 2018-12-28: 500 [IU]

## 2018-12-28 MED ORDER — ONDANSETRON HCL 4 MG PO TABS: 4.0000 mg | ORAL_TABLET | Freq: Every day | ORAL | 0 refills | Status: DC | PRN

## 2018-12-28 MED ORDER — NEBIVOLOL HCL 5 MG PO TABS: 5.0000 mg | ORAL_TABLET | Freq: Every day | ORAL | 0 refills | Status: DC

## 2018-12-28 MED ORDER — HYDROCODONE-ACETAMINOPHEN 5-325 MG PO TABS: 1.0000 | ORAL_TABLET | Freq: Four times a day (QID) | ORAL | 0 refills | Status: DC | PRN

## 2018-12-28 MED ORDER — PRO-STAT SUGAR FREE PO LIQD: 30.0000 mL | Freq: Two times a day (BID) | ORAL | 0 refills | Status: AC

## 2018-12-28 MED FILL — ONDANSETRON HCL 4 MG TABLET: 4 | 20 days supply | Qty: 20 | Fill #0

## 2018-12-28 MED FILL — BYSTOLIC 5 MG TABLET: 5 | 30 days supply | Qty: 30 | Fill #0

## 2018-12-28 MED FILL — HYDROCODON-APAP 5-325: 5-325 | 5 days supply | Qty: 20 | Fill #0

## 2018-12-28 NOTE — Progress Notes (Signed)
Nutrition Follow-up  DOCUMENTATION CODES:   Severe malnutrition in context of chronic illness  INTERVENTION:   Continue -TwoCal HN x 5 cans daily (237 ml each) -30 ml Prostat or other protein modular BID -Free water flushes 150 ml before and after each feeding -PO intake as tolerated  Provides: 2575  kcals, 130 grams protein, 830 ml free water (2330 ml with flushes).   NUTRITION DIAGNOSIS:   Severe Malnutrition related to chronic illness, cancer and cancer related treatments as evidenced by energy intake < or equal to 75% for > or equal to 1 month, percent weight loss, mild fat depletion, moderate muscle depletion.  Ongoing  GOAL:   Patient will meet greater than or equal to 90% of their needs  Addressed with TF  MONITOR:   PO intake, Supplement acceptance, Diet advancement, Labs, Weight trends, I & O's  REASON FOR ASSESSMENT:   Malnutrition Screening Tool    ASSESSMENT:   Patient with PMH significant for CAD s/p stenting, HTN, COPD, small cell lung cancer on chemotherapy/radiation. Pt recently admitted for poor oral intake last month and is s/p PEG placement on 1/20. Presents this admission with syncope likely from dehydration/poor oral intake and AKI.    2/4- SLP recommends regular diet with thin liquids  Plan for pt to discharge home today. Provided pt with donated TwoCal formula case. [facility] aware that TF formula has changed.   Nursing states upon his first feeding pt vomited almost immediately. They suspect this was due to nausea prior to the feeding. Pt was was provided Zofran before his second feeding and he tolerated two cans back to back without complication. Pt to be provided Zofran for home use.   Discussed home tube feeding plan with pt. Reitertaed importance of hydration with calorically dense formula. High encouraged pt to speak with outpatient Deltana RD at his 3 month follow up.   Medications reviewed and include: marinol BID, remeron once daily,  10 mEq KCl once daily, senokot, vit B12 Labs reviewed: CBG 63-125   Diet Order:   Diet Order            Diet Heart Room service appropriate? Yes; Fluid consistency: Thin  Diet effective now              EDUCATION NEEDS:   Education needs have been addressed  Skin:  Skin Assessment: Reviewed RN Assessment  Last BM:  2/6  Height:   Ht Readings from Last 1 Encounters:  12/26/18 5\' 9"  (1.753 m)    Weight:   Wt Readings from Last 1 Encounters:  12/28/18 85 kg    Ideal Body Weight:  72.7 kg  BMI:  Body mass index is 27.66 kg/m.  Estimated Nutritional Needs:   Kcal:  2500-2700 kcal  Protein:  125-140 grams  Fluid:  >/= 2.5 L/day   Mariana Single RD, LDN Clinical Nutrition Pager # - [phone]

## 2018-12-28 NOTE — Discharge Summary (Addendum)
Discharge Summary  Andrew West VQQ:[id number] DOB: Mar 08, 1966  PCP: [name], MD  Admit date: 12/25/2018 Discharge date: 12/28/2018  Time spent: 35 minutes  Recommendations for Outpatient Follow-up:  1. Follow-up with your heme oncologist 2. Follow-up with your PCP 3. Follow-up with your cardiologist 4. Take your medications as prescribed 5. Continue tube feeding as recommended by nutritionist  Discharge Diagnoses:  Active Hospital Problems   Diagnosis Date Noted  . Symptomatic anemia 12/25/2018  . Protein-calorie malnutrition, severe 12/26/2018  . Syncope 12/25/2018  . CKD (chronic kidney disease) stage 3, GFR 30-59 ml/min (HCC) 12/09/2018  . Type 2 diabetes mellitus (HCC) 10/08/2018  . Sleep apnea 10/08/2018  . 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 Diastolic dysfunction-grade 2 with EF 60-65% Oct 2016 03/22/2016  . Hypertension 10/03/2015  . CAD S/P multiple PCI's 10/03/2015  . COPD with asthma (HCC) 06/24/2015    Resolved Hospital Problems  No resolved problems to display.    Discharge Condition: Stable  Diet recommendation: Resume diet as recommended by nutritionist  Vitals:   12/28/18 4332 12/28/18 0827  BP: (!) 128/92   Pulse: 84   Resp:    Temp:    SpO2: 100% 94%    History of present illness:  Andrew L Troxleris a 52 y.o.malewithhistory of CAD status post stenting, hypertension, COPD, dysphagia status post PEG tube placement, small cell lung cancer with ongoing chemo and radiation was brought to the ER after patient had a syncopal episode at a convenience dose. Patient states over the last few days has not been eating well due to poor appetite, odynophagia, and intractable vomiting with previous boluses of tube feeding despite antiemetics.    Last evening prior to presentation, while in the convenience store, felt dizzy and passed out. Not sure how long he passed out for. On arrival, EMS found patient to be  significantly hypotensive. Was brought to the ER. Denies chest pain or shortness of breath. States similar episode happened 2 days ago while he was at home.  In the ER, patient was hypotensive and was given IV fluid bolus with good response.  Hemoglobin dropped from his baseline of 9 to 7.7. Creatinine increased from his baseline of 1.4 to 2.6. EKG showed normal sinus rhythm. Patient admitted for syncope likely multifactorial from symptomatic anemia, acute dehydration and acute renal failure. 2 units of PRBCs ordered for transfusion. CT head with no contrast and C-spine unremarkable for any acute findings.  12/27/2018: Patient started on new tube feeding regimen which he tolerated well.  Was having intractable vomiting with previous bolus tube feeding despite antiemetics.   12/28/2018: Patient seen and examined at bedside.  No acute events overnight.  States he feels well.  Denies nausea or abdominal pain.  Denies chest pain, dyspnea or palpitations.    On the day of discharge, the patient was hemodynamically stable.  He will need to follow-up with his heme oncologist, his cardiologist, and his primary care provider posthospitalization.  He will also need to take his medications as prescribed.  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  Area is tender, oxycodone IR as needed twice daily for severe pain  AKI on CKD 3  Baseline creatinine appears to be 1.6 with GFR 55 Appears to be back to his baseline creatinine of 1.64 Presented with creatinine of 2.41 Improved post IV fluid hydration Follow-up with your PCP  Anemia of chronic disease Low hemoglobin on presentation 7.7 Transfused 2 unit PRBC Negative FOBT Hemoglobin remained stable 9.9 post 2 unit of PRBC transfusion No sign of overt bleeding Follow-up with your heme oncologist  Small cell lung carcinoma, followed by Dr. Shirline Frees Last chemotherapy was around 3 weeks ago with radiation Follow-up with heme oncology  COPD Stable Continue COPD medications  Coronary artery disease status post PCI with stent Denies chest pain Continue cardiac medications; on aspirin, Plavix, and Crestor  Hypoglycemia in the setting of impaired glucose tolerance Last hemoglobin A1c 5.7 on 08/21/2018 Follow-up with your PCP    Code Status:Full code.  Consults called:Nutritionist   Discharge Exam: BP (!) 128/92 (BP Location: Right Arm)   Pulse 84   Temp 98.2 F (36.8 C) (Oral)   Resp 16   Ht 5\' 9"  (1.753 m)   Wt 85 kg   SpO2 94%   BMI 27.66 kg/m  . General: 53 y.o. year-old male well developed well nourished in no acute distress.  Alert and oriented x3. . Cardiovascular: Regular rate and rhythm with no rubs or gallops.  No thyromegaly or JVD noted.   Marland Kitchen Respiratory: Clear to auscultation with no wheezes or rales. Good inspiratory effort. . Abdomen: Soft nontender nondistended with normal bowel sounds x4 quadrants.  PEG tube in place with no erythema or exudates. . Musculoskeletal: No lower extremity edema. 2/4 pulses in all 4 extremities. Marland Kitchen Psychiatry: Mood is appropriate for condition and setting  Discharge  Instructions You were cared for by a hospitalist during your hospital stay. If you have any questions about your discharge medications or the care you received while you were in the hospital after you are discharged, you can call the unit and asked to speak with the hospitalist on call if the hospitalist that took care of you is not available. Once you are discharged, your primary care physician will handle any further medical issues. Please note that NO REFILLS for any discharge medications will be authorized once you are discharged, as it is imperative that you return to your primary care physician (or establish a relationship with a primary care physician if you do not have one) for your aftercare needs so that they can reassess your need for medications and monitor your lab values.   Allergies as of 12/28/2018      Reactions   Iohexol Anaphylaxis   PT. TO BE PREMEDICATED PRIOR TO IV CONTRAST PER DR Eppie Gibson /MMS//12/15/15Desc: PT BECAME SOB AND CHEST TIGHTNESS AFTER CONTRAST INJECTION.  [name],RT-RCT., Onset Date: [id number]      Medication List    STOP taking these medications   amLODipine 10 MG tablet Commonly known as:  NORVASC   baclofen 10 MG tablet Commonly known as:  LIORESAL   fluticasone 50 MCG/ACT nasal spray Commonly known  as:  FLONASE   hydrALAZINE 25 MG tablet Commonly known as:  APRESOLINE   hydrocortisone-pramoxine 2.5-1 % rectal cream Commonly known as:  ANALPRAM-HC   isosorbide mononitrate 120 MG 24 hr tablet Commonly known as:  IMDUR   lidocaine-prilocaine cream Commonly known as:  EMLA   Potassium Chloride ER 20 MEQ Tbcr   prazosin 1 MG capsule Commonly known as:  MINIPRESS   promethazine 25 MG tablet Commonly known as:  PHENERGAN     TAKE these medications   ACCU-CHEK AVIVA PLUS w/Device Kit Use as dircted   albuterol 108 (90 Base) MCG/ACT inhaler Commonly known as:  PROAIR HFA Inhale 1 puff into the lungs every 6 (six) hours as needed for  wheezing or shortness of breath.   aspirin 81 MG tablet Take 1 tablet (81 mg total) daily by mouth.   benzonatate 100 MG capsule Commonly known as:  TESSALON Take 1 capsule (100 mg total) by mouth every 8 (eight) hours. What changed:    when to take this  reasons to take this   clopidogrel 75 MG tablet Commonly known as:  PLAVIX Take 1 tablet (75 mg total) by mouth daily. YOU MAY RESTART 08/26/2018   diclofenac sodium 1 % Gel Commonly known as:  VOLTAREN Apply 2 g topically 4 (four) times daily.   dronabinol 5 MG capsule Commonly known as:  MARINOL Take 1 capsule (5 mg total) by mouth 2 (two) times daily before a meal.   feeding supplement (PRO-STAT SUGAR FREE 64) Liqd Place 30 mLs into feeding tube 2 (two) times daily for 30 days.   FLUoxetine 40 MG capsule Commonly known as:  PROZAC Take 1 capsule (40 mg total) by mouth daily.   Fluticasone-Salmeterol 100-50 MCG/DOSE Aepb Commonly known as:  ADVAIR Inhale 1 puff into the lungs 2 (two) times daily.   glucose blood test strip Commonly known as:  ACCU-CHEK AVIVA Use as instructed   HYDROcodone-acetaminophen 5-325 MG tablet Commonly known as:  NORCO Take 1 tablet by mouth every 6 (six) hours as needed for moderate pain.   LORazepam 0.5 MG tablet Commonly known as:  ATIVAN 1 tab po q 4-6 hours prn or 1 tab po 30 minutes prior to radiation   magic mouthwash w/lidocaine Soln Take 10 mLs by mouth 3 (three) times daily as needed for up to 20 days for mouth pain.   mirtazapine 30 MG tablet Commonly known as:  REMERON Take 1 tablet (30 mg total) by mouth at bedtime.   nebivolol 5 MG tablet Commonly known as:  BYSTOLIC Take 1 tablet (5 mg total) by mouth daily. Start taking on:  December 29, 2018 What changed:    medication strength  how much to take   nicotine 21 mg/24hr patch Commonly known as:  NICODERM CQ Place 1 patch (21 mg total) onto the skin daily.   nitroGLYCERIN 0.4 MG SL tablet Commonly known as:   NITROSTAT Place 1 tablet (0.4 mg total) under the tongue every 5 (five) minutes as needed for chest pain. What changed:  additional instructions   ondansetron 4 MG tablet Commonly known as:  ZOFRAN Take 1 tablet (4 mg total) by mouth daily as needed for nausea or vomiting. What changed:    medication strength  how much to take  when to take this   pantoprazole 40 MG tablet Commonly known as:  PROTONIX Take 1 tablet (40 mg total) by mouth daily.   prochlorperazine 10 MG tablet Commonly known as:  COMPAZINE Take 1  tablet (10 mg total) by mouth every 6 (six) hours as needed for nausea or vomiting.   ranolazine 1000 MG SR tablet Commonly known as:  RANEXA Take 1 tablet (1,000 mg total) by mouth 2 (two) times daily.   rosuvastatin 20 MG tablet Commonly known as:  CRESTOR Take 1 tablet (20 mg total) by mouth daily.   senna-docusate 8.6-50 MG tablet Commonly known as:  Senokot-S Take 2 tablets by mouth at bedtime.   Sterile Water Liqd Take 30 Bottles by mouth daily.   sucralfate 1 g tablet Commonly known as:  CARAFATE Take 1 tablet by mouth 4 (four) times daily -  before meals and at bedtime.   tiotropium 18 MCG inhalation capsule Commonly known as:  SPIRIVA HANDIHALER Place 1 capsule (18 mcg total) into inhaler and inhale daily.   vitamin B-12 500 MCG tablet Commonly known as:  CYANOCOBALAMIN Take 1 tablet (500 mcg total) by mouth daily.   Vitamin D 50 MCG (2000 UT) tablet Take 1 tablet (2,000 Units total) by mouth daily.   zolpidem 5 MG tablet Commonly known as:  AMBIEN Take 1 tablet (5 mg total) by mouth at bedtime.      Allergies  Allergen Reactions  . Iohexol Anaphylaxis    PT. TO BE PREMEDICATED PRIOR TO IV CONTRAST PER DR Eppie Gibson /MMS//12/15/15Desc: PT BECAME SOB AND CHEST TIGHTNESS AFTER CONTRAST INJECTION.  Ardis Hughs., Onset Date: [id number]    Follow-up Information    [name], MD. Call in 1 day(s).   Specialty:  Family  Medicine Why:  Please call for a post hospital follow-up appointment. Contact information: [address] [place] [address] [phone]        Chrystie Nose, MD .   Specialty:  Cardiology Contact information: [address] [place] [address] [phone]        Si Gaul, MD. Call in 1 day(s).   Specialty:  Oncology Why:  Please call for post hospital follow-up appointment. Contact information: [address] [place] [address] [phone]        [facility] Follow up.   Specialty:  Home Health Services Contact information: [address] [place] [address] [phone]            The results of significant diagnostics from this hospitalization (including imaging, microbiology, ancillary and laboratory) are listed below for reference.    Significant Diagnostic Studies: Ct Abdomen Pelvis Wo Contrast  Result Date: 12/01/2018 CLINICAL DATA:  Abdominal pain and nausea.  History of lung cancer. EXAM: CT ABDOMEN AND PELVIS WITHOUT CONTRAST TECHNIQUE: Multidetector CT imaging of the abdomen and pelvis was performed following the standard protocol without IV contrast. COMPARISON:  CT abdomen pelvis dated October 31, 2018. FINDINGS: Lower chest: Unchanged peripheral nodular consolidation in the right lower lobe. Hepatobiliary: No focal liver abnormality is seen. Small layering gallstones versus sludge. No gallbladder wall thickening or biliary dilatation. Pancreas: Unremarkable. No pancreatic ductal dilatation or surrounding inflammatory changes. Spleen: Normal in size without focal abnormality. Adrenals/Urinary Tract: Adrenal glands are unremarkable. Kidneys are normal, without renal calculi, focal lesion, or hydronephrosis. Bladder is unremarkable. Stomach/Bowel: Stomach is within normal limits. Appendix appears normal. No evidence of bowel wall thickening, distention, or inflammatory changes.  Vascular/Lymphatic: Aortoiliac atherosclerosis. No enlarged abdominal or pelvic lymph nodes. Reproductive: Prostate is unremarkable. Other: Unchanged small fat containing umbilical and right inguinal hernias. No free fluid or pneumoperitoneum. Musculoskeletal: No acute or significant osseous findings. IMPRESSION: 1.  No acute  intra-abdominal process. 2. Unchanged peripheral nodular consolidation in the right lower lobe. 3.  Aortic atherosclerosis (ICD10-I70.0). Electronically Signed   By: Obie Dredge M.D.   On: 12/01/2018 20:09   Dg Chest 2 View  Result Date: 12/01/2018 CLINICAL DATA:  Weakness.  History of lung cancer. EXAM: CHEST - 2 VIEW COMPARISON:  CT chest from same day. Chest x-ray dated November 02, 2018. FINDINGS: Unchanged left chest wall port catheter. The heart size and mediastinal contours are within normal limits. Normal pulmonary vascularity. Patchy, nodular consolidation in the right lower lobe, better evaluated on chest CT from same day. No pleural effusion or pneumothorax. No acute osseous abnormality. IMPRESSION: 1. Patchy, nodular consolidation in the right lower lobe, better evaluated on chest CT from same day. Electronically Signed   By: Obie Dredge M.D.   On: 12/01/2018 20:04   Ct Head Wo Contrast  Result Date: 12/25/2018 CLINICAL DATA:  Syncope, headache, history of lung cancer EXAM: CT HEAD WITHOUT CONTRAST CT CERVICAL SPINE WITHOUT CONTRAST TECHNIQUE: Multidetector CT imaging of the head and cervical spine was performed following the standard protocol without intravenous contrast. Multiplanar CT image reconstructions of the cervical spine were also generated. COMPARISON:  MR brain 09/06/2018 FINDINGS: CT HEAD FINDINGS Brain: No evidence of acute infarction, hemorrhage, hydrocephalus, extra-axial collection or mass lesion/mass effect. Vascular: No hyperdense vessel or unexpected calcification. Skull: No osseous abnormality. Sinuses/Orbits: Visualized paranasal sinuses are  clear. Visualized mastoid sinuses are clear. Visualized orbits demonstrate no focal abnormality. Other: None CT CERVICAL SPINE FINDINGS Alignment: Normal. Skull base and vertebrae: No acute fracture. No primary bone lesion or focal pathologic process. Soft tissues and spinal canal: No prevertebral fluid or swelling. No visible canal hematoma. Disc levels: Degenerative disc disease with disc height loss at C5-6 and C6-7. Mild broad-based disc osteophyte complex at C5-6. No foraminal stenosis. Upper chest: Lung apices are clear. Other: No fluid collection or hematoma. IMPRESSION: 1. No acute intracranial pathology. 2.  No acute osseous injury of the cervical spine. Electronically Signed   By: Elige Ko   On: 12/25/2018 21:09   Ct Chest Wo Contrast  Result Date: 12/01/2018 CLINICAL DATA:  Limited stage right lung small cell carcinoma diagnosed September 2019 with radiation therapy and ongoing chemotherapy. Restaging. EXAM: CT CHEST WITHOUT CONTRAST TECHNIQUE: Multidetector CT imaging of the chest was performed following the standard protocol without IV contrast. COMPARISON:  10/20/2018 chest CT. FINDINGS: Cardiovascular: Normal heart size. No significant pericardial effusion/thickening. Three-vessel coronary atherosclerosis. Left internal jugular Port-A-Cath terminates at the cavoatrial junction. Atherosclerotic nonaneurysmal thoracic aorta. Normal caliber pulmonary arteries. Mediastinum/Nodes: No discrete thyroid nodules. Unremarkable esophagus. No axillary adenopathy. Mildly enlarged 1.5 cm lower right paratracheal node (series 2/image 61), previously 1.5 cm using similar measurement technique, stable. Mildly enlarged 1.2 cm subcarinal node (series 2/image 70), stable. No new pathologically enlarged mediastinal nodes. No discrete hilar adenopathy on this noncontrast scan. Lungs/Pleura: No pneumothorax. No pleural effusion. Mild-to-moderate centrilobular and paraseptal emphysema with prominent diffuse bronchial  wall thickening. Previously described 2.3 cm focus of consolidation in the anterior peripheral right lower lobe on 10/20/2018 chest CT study has resolved with small residual linear scar in this location. New patchy irregular nodular foci of consolidation in the posterior right lower lobe, largest 2.3 x 2.2 cm (series 5/image 110) with some central ground-glass density. No additional significant pulmonary nodules. Upper abdomen: No acute abnormality. Musculoskeletal: No aggressive appearing focal osseous lesions. Mild thoracic spondylosis. IMPRESSION: 1. Right paratracheal and subcarinal lymphadenopathy is stable. No new or  progressive thoracic adenopathy. 2. Waxing and waning irregular nodular foci of consolidation at the periphery of the right lung base. Differential considerations include cryptogenic organizing pneumonia and recurrent infectious/inflammatory pneumonia (including aspiration pneumonia), with metastatic spread considered less likely but not entirely excluded. Continued close chest CT follow-up advised. Aortic Atherosclerosis (ICD10-I70.0) and Emphysema (ICD10-J43.9). Electronically Signed   By: Delbert Phenix M.D.   On: 12/01/2018 11:36   Ct Cervical Spine Wo Contrast  Result Date: 12/25/2018 CLINICAL DATA:  Syncope, headache, history of lung cancer EXAM: CT HEAD WITHOUT CONTRAST CT CERVICAL SPINE WITHOUT CONTRAST TECHNIQUE: Multidetector CT imaging of the head and cervical spine was performed following the standard protocol without intravenous contrast. Multiplanar CT image reconstructions of the cervical spine were also generated. COMPARISON:  MR brain 09/06/2018 FINDINGS: CT HEAD FINDINGS Brain: No evidence of acute infarction, hemorrhage, hydrocephalus, extra-axial collection or mass lesion/mass effect. Vascular: No hyperdense vessel or unexpected calcification. Skull: No osseous abnormality. Sinuses/Orbits: Visualized paranasal sinuses are clear. Visualized mastoid sinuses are clear. Visualized  orbits demonstrate no focal abnormality. Other: None CT CERVICAL SPINE FINDINGS Alignment: Normal. Skull base and vertebrae: No acute fracture. No primary bone lesion or focal pathologic process. Soft tissues and spinal canal: No prevertebral fluid or swelling. No visible canal hematoma. Disc levels: Degenerative disc disease with disc height loss at C5-6 and C6-7. Mild broad-based disc osteophyte complex at C5-6. No foraminal stenosis. Upper chest: Lung apices are clear. Other: No fluid collection or hematoma. IMPRESSION: 1. No acute intracranial pathology. 2.  No acute osseous injury of the cervical spine. Electronically Signed   By: Elige Ko   On: 12/25/2018 21:09   Ir Gastrostomy Tube Mod Sed  Result Date: 12/11/2018 INDICATION: Esophagitis EXAM: PERC PLACEMENT GASTROSTOMY MEDICATIONS: Ancef 2 g; Antibiotics were administered within 1 hour of the procedure. Glucagon 1 mg IV ANESTHESIA/SEDATION: Versed 6 mg IV; Fentanyl 100 mcg IV Moderate Sedation Time:  24 minutes The patient was continuously monitored during the procedure by the interventional radiology nurse under my direct supervision. CONTRAST:  No iodinated contrast was administered FLUOROSCOPY TIME:  Fluoroscopy Time: 3 minutes 24 seconds (81 mGy). COMPLICATIONS: None immediate. PROCEDURE: The procedure, risks, benefits, and alternatives were explained to the patient. Questions regarding the procedure were encouraged and answered. The patient understands and consents to the procedure. The epigastrium was prepped with Betadine in a sterile fashion, and a sterile drape was applied covering the operative field. A sterile gown and sterile gloves were used for the procedure. A 5-French orogastric tube is placed under fluoroscopic guidance. Scout imaging of the abdomen confirms barium within the transverse colon. The stomach was distended with gas. Under left anterior oblique fluoroscopic guidance, an 18 gauge needle was advanced into the lumen of the  stomach via anterior wall puncture and a T tack was deployed. A total of 3 T tacks were deployed in a triangular configuration. A 19 gauge needle was advanced between the T tacks and into the lumen of the stomach then removed over an Amplatz wire. The tract was dilated to 12 [place]. An 89 French peel-away sheath was inserted. An 18 French gastrostomy tube was then advanced over the wire and through the peel-away sheath into the lumen of the stomach. The balloon was insufflated with 7 cc saline and secured in place. 60 cc of gas was injected into the gastrostomy tube. FINDINGS: Images document placement of a 52 French balloon retention gastrostomy tube into the lumen of the stomach. Gas injected into the gastrostomy tube  distended the stomach compatible with proper positioning. IMPRESSION: Successful 18 French balloon retention gastrostomy tube placement. The tip is in the stomach. Electronically Signed   By: Jolaine Click M.D.   On: 12/11/2018 17:07    Microbiology: No results found for this or any previous visit (from the past 240 hour(s)).   Labs: Basic Metabolic Panel: Recent Labs  Lab 12/25/18 0810 12/25/18 1944 12/26/18 1005 12/27/18 0319 12/27/18 1807 12/28/18 0409  NA 143 138 140 143  --  144  K 4.1 3.9 4.0 3.9  --  4.2  CL 103 101 107 114*  --  113*  CO2 28 27 25 26   --  27  GLUCOSE 87 135* 86 99  --  72  BUN 29* 33* 30* 25*  --  27*  CREATININE 2.60* 2.88* 2.41* 1.97*  --  1.64*  CALCIUM 9.6 8.9 8.7* 8.5*  --  8.3*  MG 1.8  --  1.9 1.8 1.7 1.8  PHOS  --   --  3.4 3.1 2.8 2.5   Liver Function Tests: Recent Labs  Lab 12/25/18 0810 12/25/18 2029  AST 13* 14*  ALT 10 11  ALKPHOS 67 52  BILITOT 0.6 0.4  PROT 7.3 6.8  ALBUMIN 3.4* 3.2*   No results for input(s): LIPASE, AMYLASE in the last 168 hours. No results for input(s): AMMONIA in the last 168 hours. CBC: Recent Labs  Lab 12/25/18 0810 12/25/18 1944 12/26/18 1005 12/27/18 0319 12/28/18 0409  WBC 6.8 7.9 8.4 4.7  4.8  NEUTROABS 5.2  --   --   --   --   HGB 9.0* 7.7* 10.1* 9.6* 9.9*  HCT 28.3* 24.7* 31.3* 29.9* 31.5*  MCV 89.8 95.0 92.6 94.0 93.8  PLT 202 195 175 185 190   Cardiac Enzymes: Recent Labs  Lab 12/25/18 2054 12/26/18 1005  TROPONINI <0.03 <0.03   BNP: BNP (last 3 results) No results for input(s): BNP in the last 8760 hours.  ProBNP (last 3 results) No results for input(s): PROBNP in the last 8760 hours.  CBG: Recent Labs  Lab 12/27/18 2225 12/28/18 0103 12/28/18 0635 12/28/18 0813 12/28/18 1116  GLUCAP 123* 123* 125* 112* 63*       Signed:  Darlin Drop, MD [facility] 12/28/2018, 1:07 PM

## 2018-12-28 NOTE — Care Management Note (Signed)
Case Management Note  Patient Details  Name: TYQUEZ HOLLIBAUGH MRN: [id number] Date of Birth: February 06, 1966  Subjective/Objective:                    Action/Plan:Pt was active with [facility] for HHRN/PT, will continue at discharge. Referral given.    Expected Discharge Date:                  Expected Discharge Plan:  [place]  In-House Referral:     Discharge planning Services  CM Consult  Post Acute Care Choice:    Choice offered to:  Patient  DME Arranged:    DME Agency:     HH Arranged:  RN, PT [facility] Agency:  [facility]  Status of Service:  In process, will continue to follow  If discussed at Long Length of Stay Meetings, dates discussed:    Additional CommentsPurcell Mouton, RN 12/28/2018, 11:04 AM

## 2018-12-28 NOTE — Progress Notes (Signed)
PT Cancellation Note  Patient Details Name: Andrew West MRN: [id number] DOB: [birth year]   Cancelled Treatment:    Reason Eval/Treat Not Completed: PT screened, no needs identified, will sign off. Pt evaluated on 2/4-- see note please. Also spoke with RN who stated pt does not have any PT needs. Will sign off.    Weston Anna, PT Acute Rehabilitation Services Pager: [phone] Office: [phone]

## 2018-12-28 NOTE — Discharge Instructions (Signed)
Hypotension °As your heart beats, it forces blood through your body. This force is called blood pressure. If you have hypotension, you have low blood pressure. When your blood pressure is too low, you may not get enough blood to your brain or other parts of your body. This may cause you to feel weak, light-headed, have a fast heartbeat, or even pass out (faint). Low blood pressure may be harmless, or it may cause serious problems. °What are the causes? °· Blood loss. °· Not enough water in the body (dehydration). °· Heart problems. °· Hormone problems. °· Pregnancy. °· A very bad infection. °· Not having enough of certain nutrients. °· Very bad allergic reactions. °· Certain medicines. °What increases the risk? °· Age. The risk increases as you get older. °· Conditions that affect the heart or the brain and spinal cord (central nervous system). °· Taking certain medicines. °· Being pregnant. °What are the signs or symptoms? °· Feeling: °? Weak. °? Light-headed. °? Dizzy. °? Tired (fatigued). °· Blurred vision. °· Fast heartbeat. °· Passing out, in very bad cases. °How is this treated? °· Changing your diet. This may involve eating more salt (sodium) or drinking more water. °· Taking medicines to raise your blood pressure. °· Changing how much you take (the dosage) of some of your medicines. °· Wearing compression stockings. These stockings help to prevent blood clots and reduce swelling in your legs. °In some cases, you may need to go to the hospital for: °· Fluid replacement. This means you will receive fluids through an IV tube. °· Blood replacement. This means you will receive donated blood through an IV tube (transfusion). °· Treating an infection or heart problems, if this applies. °· Monitoring. You may need to be monitored while medicines that you are taking wear off. °Follow these instructions at home: °Eating and drinking ° °· Drink enough fluids to keep your pee (urine) pale yellow. °· Eat a healthy diet.  Follow instructions from your doctor about what you can eat or drink. A healthy diet includes: °? Fresh fruits and vegetables. °? Whole grains. °? Low-fat (lean) meats. °? Low-fat dairy products. °· Eat extra salt only as told. Do not add extra salt to your diet unless your doctor tells you to. °· Eat small meals often. °· Avoid standing up quickly after you eat. °Medicines °· Take over-the-counter and prescription medicines only as told by your doctor. °? Follow instructions from your doctor about changing how much you take of your medicines, if this applies. °? Do not stop or change any of your medicines on your own. °General instructions ° °· Wear compression stockings as told by your doctor. °· Get up slowly from lying down or sitting. °· Avoid hot showers and a lot of heat as told by your doctor. °· Return to your normal activities as told by your doctor. Ask what activities are safe for you. °· Do not use any products that contain nicotine or tobacco, such as cigarettes, e-cigarettes, and chewing tobacco. If you need help quitting, ask your doctor. °· Keep all follow-up visits as told by your doctor. This is important. °Contact a doctor if: °· You throw up (vomit). °· You have watery poop (diarrhea). °· You have a fever for more than 2-3 days. °· You feel more thirsty than normal. °· You feel weak and tired. °Get help right away if: °· You have chest pain. °· You have a fast or uneven heartbeat. °· You lose feeling (have numbness) in any   part of your body.  You cannot move your arms or your legs.  You have trouble talking.  You get sweaty or feel light-headed.  You pass out.  You have trouble breathing.  You have trouble staying awake.  You feel mixed up (confused). Summary  Hypotension is also called low blood pressure. It is when the force of blood pumping through your arteries is too weak.  Hypotension may be harmless, or it may cause serious problems.  Treatment may include changing  your diet and medicines, and wearing compression stockings.  In very bad cases, you may need to go to the hospital. This information is not intended to replace advice given to you by your health care provider. Make sure you discuss any questions you have with your health care provider. Document Released: 02/02/2010 Document Revised: 05/04/2018 Document Reviewed: 05/04/2018 Elsevier Interactive Patient Education  2019 [place].   Syncope Syncope is when you pass out (faint) for a short time. It is caused by a sudden decrease in blood flow to the brain. Signs that you may be about to pass out include:  Feeling dizzy or light-headed.  Feeling sick to your stomach (nauseous).  Seeing all white or all black.  Having cold, clammy skin. If you pass out, get help right away. Call your local emergency services (911 in the [place] Do not drive yourself to the hospital. Follow these instructions at home: Watch for any changes in your symptoms. Take these actions to stay safe and help with your symptoms: Lifestyle  Do not drive, use machinery, or play sports until your doctor says it is okay.  Do not drink alcohol.  Do not use any products that contain nicotine or tobacco, such as cigarettes and e-cigarettes. If you need help quitting, ask your doctor.  Drink enough fluid to keep your pee (urine) pale yellow. General instructions  Take over-the-counter and prescription medicines only as told by your doctor.  If you are taking blood pressure or heart medicine, sit up and stand up slowly. Spend a few minutes getting ready to sit and then stand. This can help you feel less dizzy.  Have someone stay with you until you feel stable.  If you start to feel like you might pass out, lie down right away and raise (elevate) your feet above the level of your heart. Breathe deeply and steadily. Wait until all of the symptoms are gone.  Keep all follow-up visits as told by your doctor. This is  important. Get help right away if:  You have a very bad headache.  You pass out once or more than once.  You have pain in your chest, belly, or back.  You have a very fast or uneven heartbeat (palpitations).  It hurts to breathe.  You are bleeding from your mouth or your bottom (rectum).  You have black or tarry poop (stool).  You have jerky movements that you cannot control (seizure).  You are confused.  You have trouble walking.  You are very weak.  You have vision problems. These symptoms may be an emergency. Do not wait to see if the symptoms will go away. Get medical help right away. Call your local emergency services (911 in the [place] Do not drive yourself to the hospital. Summary  Syncope is when you pass out (faint) for a short time. It is caused by a sudden decrease in blood flow to the brain.  Signs that you may be about to faint include feeling dizzy, light-headed, or  sick to your stomach, seeing all white or all black, or having cold, clammy skin.  If you start to feel like you might pass out, lie down right away and raise (elevate) your feet above the level of your heart. Breathe deeply and steadily. Wait until all of the symptoms are gone. This information is not intended to replace advice given to you by your health care provider. Make sure you discuss any questions you have with your health care provider. Document Released: 04/26/2008 Document Revised: 12/21/2017 Document Reviewed: 12/21/2017 Elsevier Interactive Patient Education  2019 [employer].   Near-Syncope Near-syncope is when you suddenly get weak or dizzy, or you feel like you might pass out (faint). This is due to a lack of blood flow to the brain. During an episode of near-syncope, you may:  Feel dizzy or light-headed.  Feel sick to your stomach (nauseous).  See all white or all black.  Have cold, clammy skin. This condition is caused by a sudden decrease in blood flow to the brain. This  decrease can result from various causes, but most of those causes are not dangerous. However, near-syncope may be a sign of a serious medical problem, so it is important to seek medical care. Follow these instructions at home: Pay attention to any changes in your symptoms. Take these actions to help with your condition:  Have someone stay with you until you feel stable.  Talk with your doctor about your symptoms. You may need to have testing to understand the cause of your near-syncope.  Do not drive, use machinery, or play sports until your doctor says it is okay.  Keep all follow-up visits as told by your doctor. This is important.  If you start to feel like you might pass out, lie down right away and raise (elevate) your feet above the level of your heart. Breathe deeply and steadily. Wait until all of the symptoms are gone.  Drink enough fluid to keep your pee (urine) pale yellow. Medicines  If you are taking blood pressure or heart medicine, get up slowly and spend many minutes getting ready to sit and then stand. This can help with dizziness.  Take over-the-counter and prescription medicines only as told by your doctor. Get help right away if you:  Have a seizure.  Have pain in your: ? Chest. ? Tummy, ? Back.  Faint.  Have a bad headache.  Are bleeding from your mouth or butt.  Have black or tarry poop (stool).  Have a very fast or uneven heartbeat (palpitations).  Are confused.  Have trouble walking.  Are very weak.  Have trouble seeing. These symptoms may represent a serious problem that is an emergency. Do not wait to see if your symptoms will go away. Get medical help right away. Call your local emergency services (911 in the [place] Do not drive yourself to the hospital. Summary  Near-syncope is when you suddenly get weak or dizzy, or you feel like you might pass out.  This condition is caused by a lack of blood flow to the brain.  Near-syncope may be a  sign of a serious medical problem, so it is important to seek medical care. This information is not intended to replace advice given to you by your health care provider. Make sure you discuss any questions you have with your health care provider. Document Released: 04/26/2008 Document Revised: 07/04/2018 Document Reviewed: 07/23/2015 Elsevier Interactive Patient Education  2019 [employer].

## 2018-12-28 NOTE — Telephone Encounter (Signed)
Scheduled appt per 2/6 sch message - pt is aware

## 2018-12-28 NOTE — Progress Notes (Signed)
Pt discharged home today per Dr. Nevada Crane. Pt's IV site D/C'd and WDL. Pt's VSS. Pt provided with home medication list, discharge instructions and prescriptions. Verbalized understanding. Pt ambulated off floor in stable condition accompanied by RN.

## 2018-12-29 ENCOUNTER — Encounter: Payer: Self-pay | Admitting: *Deleted

## 2018-12-29 DIAGNOSIS — I129 Hypertensive chronic kidney disease with stage 1 through stage 4 chronic kidney disease, or unspecified chronic kidney disease: Secondary | ICD-10-CM | POA: Diagnosis not present

## 2018-12-29 DIAGNOSIS — I2510 Atherosclerotic heart disease of native coronary artery without angina pectoris: Secondary | ICD-10-CM | POA: Diagnosis not present

## 2018-12-29 DIAGNOSIS — R627 Adult failure to thrive: Secondary | ICD-10-CM | POA: Diagnosis not present

## 2018-12-29 DIAGNOSIS — K208 Other esophagitis: Secondary | ICD-10-CM | POA: Diagnosis not present

## 2018-12-29 DIAGNOSIS — R1310 Dysphagia, unspecified: Secondary | ICD-10-CM | POA: Diagnosis not present

## 2018-12-29 DIAGNOSIS — Z431 Encounter for attention to gastrostomy: Secondary | ICD-10-CM | POA: Diagnosis not present

## 2019-01-01 ENCOUNTER — Telehealth: Payer: Self-pay | Admitting: Family Medicine

## 2019-01-01 ENCOUNTER — Other Ambulatory Visit: Payer: Medicare Other

## 2019-01-01 ENCOUNTER — Other Ambulatory Visit: Payer: Self-pay | Admitting: Internal Medicine

## 2019-01-01 ENCOUNTER — Inpatient Hospital Stay: Payer: Medicare Other | Admitting: Nutrition

## 2019-01-01 MED ORDER — HYDROCODONE-ACETAMINOPHEN 5-325 MG PO TABS: 1.0000 | ORAL_TABLET | Freq: Four times a day (QID) | ORAL | 0 refills | Status: DC | PRN

## 2019-01-01 MED FILL — HYDROCODON-APAP 5-325: 5-325 | 10 days supply | Qty: 40 | Fill #0

## 2019-01-01 NOTE — Telephone Encounter (Signed)
New Message   Myriam Jacobson with [facility] is calling wanting to know if its ok to resume care for pt 1 day a week for next 3 weeks. Please f/u

## 2019-01-01 NOTE — Progress Notes (Signed)
Nutrition follow-up completed with patient.  Patient was recently discharged from the hospital. 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 Patient is status post PEG placement on January 20. He is currently tolerating TwoCal HN via bolus method. He is using 4 bottles a day with 150 mL free water before and after each bolus feeding. Patient is infusing 30 mL of ProMod once daily without any difficulty. In addition patient is able to eat some food including cube steak, potatoes, macaroni and cheese, cucumbers and tomatoes, and ice cream. SLP had recommended regular diet with thin liquids on February 4. Weight improved and was documented as 189.6 pounds. Labs were reviewed. Patient denies nausea, vomiting, constipation, and diarrhea.  Estimated nutrition needs: 2500-2700 cal, 125-140 g protein, 2.5 L fluid.  Nutrition diagnosis: Severe malnutrition is improving.  Intervention: Educated patient to continue strategies for giving TwoCal HN via bolus method 4-5 bottles daily.  Educated patient to increase ProMod 30 mL twice daily.  Stressed importance of continuing free water flushes. Patient was encouraged to continue oral intake as tolerated and as permitted by physician. Questions were answered.  Teach back method used.  Contact information was provided.  Monitoring, evaluation, goals: Patient will tolerate tube feeding and oral intake to minimize weight loss.  Next visit: Patient to contact me with questions or concerns.  **Disclaimer: This note was dictated with voice recognition software. Similar sounding words can inadvertently be transcribed and this note may contain transcription errors which may not have been corrected upon publication of note.**

## 2019-01-02 ENCOUNTER — Other Ambulatory Visit: Payer: Self-pay

## 2019-01-02 MED ORDER — GLUCOSE BLOOD VI STRP: ORAL_STRIP | 12 refills | Status: DC

## 2019-01-02 MED ORDER — ACCU-CHEK FASTCLIX LANCETS MISC: 12 refills | Status: DC

## 2019-01-02 MED ORDER — ACCU-CHEK GUIDE ME W/DEVICE KIT: 1.0000 | PACK | Freq: Every day | 0 refills | Status: DC

## 2019-01-02 NOTE — Telephone Encounter (Signed)
Andrew West was called and given verbal orders for patient.

## 2019-01-04 DIAGNOSIS — I129 Hypertensive chronic kidney disease with stage 1 through stage 4 chronic kidney disease, or unspecified chronic kidney disease: Secondary | ICD-10-CM | POA: Diagnosis not present

## 2019-01-04 DIAGNOSIS — R627 Adult failure to thrive: Secondary | ICD-10-CM | POA: Diagnosis not present

## 2019-01-04 DIAGNOSIS — K208 Other esophagitis: Secondary | ICD-10-CM | POA: Diagnosis not present

## 2019-01-04 DIAGNOSIS — R1310 Dysphagia, unspecified: Secondary | ICD-10-CM | POA: Diagnosis not present

## 2019-01-04 DIAGNOSIS — Z431 Encounter for attention to gastrostomy: Secondary | ICD-10-CM | POA: Diagnosis not present

## 2019-01-04 DIAGNOSIS — I2510 Atherosclerotic heart disease of native coronary artery without angina pectoris: Secondary | ICD-10-CM | POA: Diagnosis not present

## 2019-01-05 ENCOUNTER — Encounter: Payer: Medicare Other | Admitting: Nutrition

## 2019-01-08 ENCOUNTER — Telehealth: Payer: Self-pay | Admitting: *Deleted

## 2019-01-08 ENCOUNTER — Other Ambulatory Visit (HOSPITAL_COMMUNITY): Payer: Self-pay | Admitting: Radiology

## 2019-01-08 ENCOUNTER — Other Ambulatory Visit: Payer: Medicare Other

## 2019-01-08 DIAGNOSIS — R633 Feeding difficulties, unspecified: Secondary | ICD-10-CM

## 2019-01-08 NOTE — Telephone Encounter (Signed)
"  Andrew West [phone]).  I'm a patient of r. Mohamed.  Had a feeding tube placed in my stomach a couple weeks ago ([phone]).  Could someone call me back.  Having some pains with it."

## 2019-01-08 NOTE — Telephone Encounter (Signed)
Called patient regarding feeding tube. States he is having a lot of pain and it looks like pus coming out. Dr Julien Nordmann recommended follow up with IR.  IR notified- they will call patient

## 2019-01-09 ENCOUNTER — Ambulatory Visit (HOSPITAL_COMMUNITY)
Admission: RE | Admit: 2019-01-09 | Discharge: 2019-01-09 | Disposition: A | Discharge status: Home / Self Care | Payer: Medicare Other | Source: Ambulatory Visit | Attending: Radiology | Admitting: Radiology

## 2019-01-09 DIAGNOSIS — R633 Feeding difficulties, unspecified: Secondary | ICD-10-CM

## 2019-01-09 MED FILL — ZOLPIDEM TARTRATE 5 MG TAB: 5 | 30 days supply | Qty: 30 | Fill #0

## 2019-01-09 MED FILL — ONDANSETRON HCL 8 MG TABLET: 8 | 7 days supply | Qty: 20 | Fill #0

## 2019-01-10 DIAGNOSIS — R1310 Dysphagia, unspecified: Secondary | ICD-10-CM | POA: Diagnosis not present

## 2019-01-10 DIAGNOSIS — Z431 Encounter for attention to gastrostomy: Secondary | ICD-10-CM | POA: Diagnosis not present

## 2019-01-10 DIAGNOSIS — I2510 Atherosclerotic heart disease of native coronary artery without angina pectoris: Secondary | ICD-10-CM | POA: Diagnosis not present

## 2019-01-10 DIAGNOSIS — R627 Adult failure to thrive: Secondary | ICD-10-CM | POA: Diagnosis not present

## 2019-01-10 DIAGNOSIS — K208 Other esophagitis: Secondary | ICD-10-CM | POA: Diagnosis not present

## 2019-01-10 DIAGNOSIS — I129 Hypertensive chronic kidney disease with stage 1 through stage 4 chronic kidney disease, or unspecified chronic kidney disease: Secondary | ICD-10-CM | POA: Diagnosis not present

## 2019-01-11 ENCOUNTER — Ambulatory Visit
Admission: RE | Admit: 2019-01-11 | Discharge: 2019-01-11 | Disposition: A | Discharge status: Home / Self Care | Payer: Medicare Other | Source: Ambulatory Visit | Attending: Radiation Oncology | Admitting: Radiation Oncology

## 2019-01-11 ENCOUNTER — Ambulatory Visit: Payer: Medicare Other | Admitting: Radiation Oncology

## 2019-01-11 ENCOUNTER — Telehealth: Payer: Self-pay | Admitting: *Deleted

## 2019-01-11 ENCOUNTER — Other Ambulatory Visit: Payer: Self-pay

## 2019-01-11 ENCOUNTER — Encounter: Payer: Self-pay | Admitting: Radiation Oncology

## 2019-01-11 VITALS — BP 140/103 | HR 88 | Temp 98.6°F | Resp 20 | Ht 69.0 in | Wt 189.8 lb

## 2019-01-11 DIAGNOSIS — C3411 Malignant neoplasm of upper lobe, right bronchus or lung: Secondary | ICD-10-CM | POA: Diagnosis not present

## 2019-01-11 DIAGNOSIS — Z510 Encounter for antineoplastic radiation therapy: Secondary | ICD-10-CM | POA: Insufficient documentation

## 2019-01-11 DIAGNOSIS — Z923 Personal history of irradiation: Secondary | ICD-10-CM | POA: Diagnosis not present

## 2019-01-11 DIAGNOSIS — Z9221 Personal history of antineoplastic chemotherapy: Secondary | ICD-10-CM | POA: Diagnosis not present

## 2019-01-11 DIAGNOSIS — Z7982 Long term (current) use of aspirin: Secondary | ICD-10-CM | POA: Diagnosis not present

## 2019-01-11 DIAGNOSIS — Z79899 Other long term (current) drug therapy: Secondary | ICD-10-CM | POA: Diagnosis not present

## 2019-01-11 DIAGNOSIS — C3490 Malignant neoplasm of unspecified part of unspecified bronchus or lung: Secondary | ICD-10-CM

## 2019-01-11 LAB — BUN & CREATININE (CHCC)
BUN: 12 mg/dL (ref 6–20)
Creatinine: 1.45 mg/dL — ABNORMAL HIGH (ref 0.61–1.24)
GFR, Est AFR Am: >60 mL/min (ref >60)
GFR, Estimated: 55 mL/min — ABNORMAL LOW (ref >60)

## 2019-01-11 NOTE — Progress Notes (Signed)
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 The patient then received a boost treatment for an additional 6 Gy delivered in 3 fractions. This yielded a final total dose of 66 Gy.   Narrative:  The patient returns today for routine follow-up. In summary this is a pleasant 53 y.o. gentleman with a history of limited stage small cell carcinoma of the right hilum who recently completed chemoradiation to the right chest. He has completed his chemotherapy and is now going to be followed in observation based on stable restaging scans he's recently had. He did have an episode of syncope on 12/25/2018. His hospital work up was negative for disease in the brain by CT imaging, but he's not had an MRI scan. He also had severe dysphagia with radiotherapy and even prior to treatment. This is resolved per report and he has been working with nutrition regarding his tube feedings. He comes today to discuss moving forward with prophylactic cranial irradiation (PCI).                             On review of systems, the patient states he is feeling well. He has not had any additional headaches or episodes of syncope. He does complain of pain in the right thorax along the shoulder and into the base of the middle of the clavicle. He requests pain medication. He has a prescription for hydrocodone but states this is not strong enough.   ALLERGIES:  is allergic to iohexol.  Meds: Current Outpatient Medications  Medication Sig Dispense Refill  . albuterol (PROAIR HFA) 108 (90 Base) MCG/ACT inhaler  Inhale 1 puff into the lungs every 6 (six) hours as needed for wheezing or shortness of breath. 1 Inhaler 6  . Amino Acids-Protein Hydrolys (FEEDING SUPPLEMENT, PRO-STAT SUGAR FREE 64,) LIQD Place 30 mLs into feeding tube 2 (two) times daily for 30 days. 1800 mL 0  . aspirin 81 MG tablet Take 1 tablet (81 mg total) daily by mouth. 30 tablet   . benzonatate (TESSALON) 100 MG capsule Take 1 capsule (100 mg total) by mouth every 8 (eight) hours. (Patient taking differently: Take 100 mg by mouth 3 (three) times daily as needed for cough. ) 21 capsule 0  . Cholecalciferol (VITAMIN D) 2000 units tablet Take 1 tablet (2,000 Units total) by mouth daily. 30 tablet 1  . clopidogrel (PLAVIX) 75 MG tablet Take 1 tablet (75 mg total) by mouth daily. YOU MAY RESTART 08/26/2018 90 tablet 1  . diclofenac sodium (VOLTAREN) 1 % GEL Apply 2 g topically 4 (four) times daily. 1 Tube 0  . dronabinol (MARINOL) 5 MG capsule Take 1 capsule (5 mg total) by mouth 2 (two) times daily before a meal. 60 capsule 0  . FLUoxetine (PROZAC) 40 MG capsule Take 1 capsule (40 mg total) by mouth daily. 90 capsule 1  . Fluticasone-Salmeterol (ADVAIR) 100-50 MCG/DOSE AEPB Inhale 1 puff into the lungs 2 (two) times daily. 1 each 3  . LORazepam (ATIVAN)  0.5 MG tablet 1 tab po q 4-6 hours prn or 1 tab po 30 minutes prior to radiation 30 tablet 0  . mirtazapine (REMERON) 30 MG tablet Take 1 tablet (30 mg total) by mouth at bedtime. 30 tablet 2  . nebivolol (BYSTOLIC) 5 MG tablet Take 1 tablet (5 mg total) by mouth daily. 30 tablet 0  . nicotine (NICODERM CQ) 21 mg/24hr patch Place 1 patch (21 mg total) onto the skin daily. 28 patch 0  . nitroGLYCERIN (NITROSTAT) 0.4 MG SL tablet Place 1 tablet (0.4 mg total) under the tongue every 5 (five) minutes as needed for chest pain. (Patient taking differently: Place 0.4 mg under the tongue every 5 (five) minutes as needed for chest pain. Chest pain) 25 tablet 2  . ondansetron (ZOFRAN) 4 MG tablet Take 1  tablet (4 mg total) by mouth daily as needed for nausea or vomiting. 20 tablet 0  . pantoprazole (PROTONIX) 40 MG tablet Take 1 tablet (40 mg total) by mouth daily. 90 tablet 1  . prochlorperazine (COMPAZINE) 10 MG tablet Take 1 tablet (10 mg total) by mouth every 6 (six) hours as needed for nausea or vomiting. 30 tablet 0  . ranolazine (RANEXA) 1000 MG SR tablet Take 1 tablet (1,000 mg total) by mouth 2 (two) times daily. 180 tablet 3  . rosuvastatin (CRESTOR) 20 MG tablet Take 1 tablet (20 mg total) by mouth daily. 90 tablet 1  . senna-docusate (SENOKOT-S) 8.6-50 MG tablet Take 2 tablets by mouth at bedtime. 30 tablet 0  . Sterile Water LIQD Take 30 Bottles by mouth daily. 30 Bottle 0  . sucralfate (CARAFATE) 1 g tablet Take 1 tablet by mouth 4 (four) times daily -  before meals and at bedtime.  0  . tiotropium (SPIRIVA HANDIHALER) 18 MCG inhalation capsule Place 1 capsule (18 mcg total) into inhaler and inhale daily. 90 capsule 1  . vitamin B-12 (CYANOCOBALAMIN) 500 MCG tablet Take 1 tablet (500 mcg total) by mouth daily. 30 tablet 1  . zolpidem (AMBIEN) 5 MG tablet Take 1 tablet (5 mg total) by mouth at bedtime. 30 tablet 2  . ACCU-CHEK FASTCLIX LANCETS MISC Use as directed (Patient not taking: Reported on 01/11/2019) 102 each 12  . Blood Glucose Monitoring Suppl (ACCU-CHEK GUIDE ME) w/Device KIT 1 each by Does not apply route daily. (Patient not taking: Reported on 01/11/2019) 1 kit 0  . glucose blood (ACCU-CHEK GUIDE) test strip Use as instructed (Patient not taking: Reported on 01/11/2019) 100 each 12  . HYDROcodone-acetaminophen (NORCO) 5-325 MG tablet Take 1 tablet by mouth every 6 (six) hours as needed for moderate pain. (Patient not taking: Reported on 01/11/2019) 40 tablet 0   No current facility-administered medications for this encounter.     Physical Findings:  height is 5\' 9"  (1.753 m) and weight is 189 lb 12.8 oz (86.1 kg). His oral temperature is 98.6 F (37 C). His blood pressure  is 144/104 (abnormal, pended) and his pulse is 88. His respiration is 20 and oxygen saturation is 100%.  Pain Assessment Pain Score: 7  Pain Loc: Chest(Right side of chest and head)/10 In general this is a well appearing African American male in no acute distress. He's alert and oriented x4 and appropriate throughout the examination. Cardiopulmonary assessment is negative for acute distress and he exhibits normal effort.   Lab Findings: Lab Results  Component Value Date   WBC 4.8 12/28/2018   HGB 9.9 (L) 12/28/2018   HCT 31.5 (L) 12/28/2018  MCV 93.8 12/28/2018   PLT 190 12/28/2018     Radiographic Findings: Ct Head Wo Contrast  Result Date: 12/25/2018 CLINICAL DATA:  Syncope, headache, history of lung cancer EXAM: CT HEAD WITHOUT CONTRAST CT CERVICAL SPINE WITHOUT CONTRAST TECHNIQUE: Multidetector CT imaging of the head and cervical spine was performed following the standard protocol without intravenous contrast. Multiplanar CT image reconstructions of the cervical spine were also generated. COMPARISON:  MR brain 09/06/2018 FINDINGS: CT HEAD FINDINGS Brain: No evidence of acute infarction, hemorrhage, hydrocephalus, extra-axial collection or mass lesion/mass effect. Vascular: No hyperdense vessel or unexpected calcification. Skull: No osseous abnormality. Sinuses/Orbits: Visualized paranasal sinuses are clear. Visualized mastoid sinuses are clear. Visualized orbits demonstrate no focal abnormality. Other: None CT CERVICAL SPINE FINDINGS Alignment: Normal. Skull base and vertebrae: No acute fracture. No primary bone lesion or focal pathologic process. Soft tissues and spinal canal: No prevertebral fluid or swelling. No visible canal hematoma. Disc levels: Degenerative disc disease with disc height loss at C5-6 and C6-7. Mild broad-based disc osteophyte complex at C5-6. No foraminal stenosis. Upper chest: Lung apices are clear. Other: No fluid collection or hematoma. IMPRESSION: 1. No acute  intracranial pathology. 2.  No acute osseous injury of the cervical spine. Electronically Signed   By: Elige Ko   On: 12/25/2018 21:09   Ct Cervical Spine Wo Contrast  Result Date: 12/25/2018 CLINICAL DATA:  Syncope, headache, history of lung cancer EXAM: CT HEAD WITHOUT CONTRAST CT CERVICAL SPINE WITHOUT CONTRAST TECHNIQUE: Multidetector CT imaging of the head and cervical spine was performed following the standard protocol without intravenous contrast. Multiplanar CT image reconstructions of the cervical spine were also generated. COMPARISON:  MR brain 09/06/2018 FINDINGS: CT HEAD FINDINGS Brain: No evidence of acute infarction, hemorrhage, hydrocephalus, extra-axial collection or mass lesion/mass effect. Vascular: No hyperdense vessel or unexpected calcification. Skull: No osseous abnormality. Sinuses/Orbits: Visualized paranasal sinuses are clear. Visualized mastoid sinuses are clear. Visualized orbits demonstrate no focal abnormality. Other: None CT CERVICAL SPINE FINDINGS Alignment: Normal. Skull base and vertebrae: No acute fracture. No primary bone lesion or focal pathologic process. Soft tissues and spinal canal: No prevertebral fluid or swelling. No visible canal hematoma. Disc levels: Degenerative disc disease with disc height loss at C5-6 and C6-7. Mild broad-based disc osteophyte complex at C5-6. No foraminal stenosis. Upper chest: Lung apices are clear. Other: No fluid collection or hematoma. IMPRESSION: 1. No acute intracranial pathology. 2.  No acute osseous injury of the cervical spine. Electronically Signed   By: Elige Ko   On: 12/25/2018 21:09    Impression/Plan: 1. 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 The patient is healing and doing much better since completing his chest radiation. We discussed the rationale to consider PCI as he has up to a 60% chance of developing brain disease due to the type of cancer he has. He was counseled on  proceeding with a brain MRI to rule out disease. CT was negative, but this would be more sensitive and if he had brain disease, he would have a different prescription for his brain treatment. We reviewed the risks, benefits, short, and long term effects of radiotherapy as well as the delivery and logistics. Written consent is obtained and placed in the chart, a copy was provided to the patient. We will proceed with simulation once we know the date of his MRI. Dr. Mitzi Hansen recommends a course of 2 weeks of therapy.  2. Chest wall and shoulder pain. The  patient has had three prescriptions for controlled substances in the last 30 days from different providers. I recommended that he discuss his pain further with Dr. [name] who has been prescribing the majority of his pain medication. I told him that we would not be providing pain medication for this site. In addition, he may benefit from meeting with an interventional pain specialist. I offered him a referral and he is interested in meeting with Dr. Ollen Bowl. A referral will be placed.  3. Renal insufficiency. We will repeat BUN/Creatinine today to determine if he can have a contrasted MRI scan. It appears that his renal insufficiency likely was due to dehydration so hopefully that has improved since his hospitalization. 4. Severe Protein Calorie Malnutrition. The patient is doing much better with his tube feedings. He reports he is now only doing enteral feedings 3 times a week because he is able to tolerate solid foods. I will follow up with nutrition to let them know this. Perhaps if Dr. [name] allows he could have his PEG removed soon.      Osker Mason, PAC

## 2019-01-11 NOTE — Telephone Encounter (Signed)
I called [name] to inform him that per Shona Simpson, PA she has put in the referral for the pain doctor and and MRI.  Also I informed him that per Shona Simpson, PA that his blood work looks better. Patient was very appreciative and verbalized understanding.

## 2019-01-11 NOTE — Progress Notes (Signed)
Patient presents today with an elevated BP. Initial blood pressure taken by the nurse aide was 140/103.  I retook blood pressure 20 mins later and it was 144/104, provider notified.  Patient states that he is in a lot of pain in his upper right chest and he has a headache. Rates his pain at a level 7.5 on a 0-10 pain scale.

## 2019-01-12 ENCOUNTER — Other Ambulatory Visit: Payer: Self-pay | Admitting: Pharmacist

## 2019-01-12 DIAGNOSIS — J449 Chronic obstructive pulmonary disease, unspecified: Secondary | ICD-10-CM

## 2019-01-12 MED ORDER — ALBUTEROL SULFATE HFA 108 (90 BASE) MCG/ACT IN AERS: 1.0000 | INHALATION_SPRAY | Freq: Four times a day (QID) | RESPIRATORY_TRACT | 2 refills | Status: DC | PRN

## 2019-01-12 MED FILL — VENTOLIN HFA 90 MCG INHALER: 108 (90 BAS | 30 days supply | Qty: 18 | Fill #0

## 2019-01-16 ENCOUNTER — Inpatient Hospital Stay: Payer: Medicare Other

## 2019-01-16 NOTE — Progress Notes (Signed)
Nutrition Follow-up:  Patient with small cell lung cancer.  Patient has completed chemotherapy and radiation therapy.  Reports that he is planning to take radiation to his brain but this has not been scheduled yet.    Met with patient and wife in clinic.  Patient reports he is using his feeding tube about 3 times per week of 1 carton of Two Cal HN.  Reports last used his tube this am with infusing 1 carton of Two Cal HN.   Reports typical day is eating 2 eggs, 5-6 strips of bacon, grits and coffee, juice.  Lunch maybe chicken thigh and leg with mashed potatoes, macaroni and cheese, biscuit and sweet tea or may have fish (3 pieces), fries and hushpuppies with coleslaw and tea.  Supper last night was grilled cheese (1/2) and soup.  Sometimes will give Two Cal at night.  Drinks juice, water, yahoo, fruit punch daily.    Reports some irritation when swallowing foods (even liquids) but does not prevent him from eating.    Denies any other nutrition impact symptoms.    Reports that he is ready for tube to come out.   Medications: reviewed No medications going through tube  Labs: reviewed  Anthropometrics:   Weight measured today in nutrition clinic at 190 lb 8 oz, increased slightly from 189lb 6 oz on 2/10   Estimated Energy Needs  Kcals: 2500-2700 Protein: 125-140 g Fluid: 2.5 L/d  NUTRITION DIAGNOSIS: severe malnutrition improving   INTERVENTION:  Recommend for patient to not use feeding tube for at least 4 weeks and be able to maintain weight and hydration during this time frame before removal of PEG tube.   Patient wants to stop using tube after today and will plan to eat orally.  Discussed high calorie, high protein foods. Handout provided Provided samples of high calorie oral nutrition supplement shake (ensure enlive, boost plus) and encouraged at least 1 daily.  Coupons provided. Encouraged patient to weigh self weekly on home scales to keep track of weight.   Encouraged  patient to call RD with questions or weight decrease.   Instructed patient to flush tube with 89m of water daily when not in use and to keep site clean.    MONITORING, EVALUATION, GOAL: Patient will tolerate oral intake to maintain weight and have feeding tube removed   NEXT VISIT: March 24th  Andrew Sheek B. [place]enia Resides [place]istered Dietitian [phone](pager)

## 2019-01-17 ENCOUNTER — Telehealth: Payer: Self-pay | Admitting: *Deleted

## 2019-01-17 MED ORDER — HYDROCODONE-ACETAMINOPHEN 5-325 MG PO TABS: 1.0000 | ORAL_TABLET | Freq: Four times a day (QID) | ORAL | 0 refills | Status: DC | PRN

## 2019-01-17 NOTE — Telephone Encounter (Signed)
Pt called request for refill on hydrocodone. Will review with MD

## 2019-01-18 ENCOUNTER — Telehealth: Payer: Self-pay | Admitting: Medical Oncology

## 2019-01-18 ENCOUNTER — Encounter: Payer: Self-pay | Admitting: General Practice

## 2019-01-18 ENCOUNTER — Encounter: Payer: Self-pay | Admitting: *Deleted

## 2019-01-18 ENCOUNTER — Other Ambulatory Visit: Payer: Self-pay | Admitting: Internal Medicine

## 2019-01-18 MED FILL — HYDROCODON-APAP 5-325: 5-325 | 10 days supply | Qty: 40 | Fill #0

## 2019-01-18 NOTE — Telephone Encounter (Signed)
F/u call from Education officer, museum. Pt reports  persistent nausea and vomited daily since Monday , no appetite. He re-started tube feedings on Tuesday. LBM today and "normal ". I offered [facility] appt today. "Im just going to go to ED if I don't feel better " . Again I offered [facility] appt today. "If I don't feel better I will go to ED after my ride gets off work."

## 2019-01-18 NOTE — Progress Notes (Signed)
[place] Psychosocial Distress Screening Clinical Social Work  Clinical Social Work was referred by distress screening protocol.  The patient scored a 7 on the Psychosocial Distress Thermometer which indicates moderate distress. Clinical Social Worker contacted patient by phone to assess for distress and other psychosocial needs. Patient's primary concern at present is medical follow up for nausea, vomiting, pain and similar.  Desk nurse and navigator advised.  Spends time at home w wife and mother, grandchildren visit regularly.  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  Wishes he could return to work. Encouraged to find activities that will connect him w others and get him out of the house - information on [place] activities mailed.    ONCBCN DISTRESS SCREENING 01/11/2019  Screening Type Change in Status  Distress experienced in past week (1-10) 7  Practical problem type Insurance  Emotional problem type Depression;Nervousness/Anxiety;Adjusting to illness;Isolation/feeling alone;Feeling hopeless;Adjusting to appearance changes  Spiritual/Religous concerns type   Information Concerns Type Lack of info about treatment  Physical Problem type Pain;Nausea/vomiting;Sleep/insomnia;Getting around;Tingling hands/feet;Skin dry/itchy;Swollen arms/legs  Referral to clinical social work Yes  Referral to support programs   Other     Clinical Social Worker follow up needed: No.  If yes, follow up plan:  Beverely Pace, [place], LCSW Clinical Social Worker Phone:  [phone]

## 2019-01-18 NOTE — Progress Notes (Signed)
Oncology Nurse Navigator Documentation  Oncology Nurse Navigator Flowsheets 01/18/2019  Navigator Location [facility]  Navigator Encounter Type Other/I received a message from [place] that patient needs help with symptoms. I forward message to desk nurse and Dr. Julien Nordmann.   Barriers/Navigation Needs Coordination of Care  Interventions Coordination of Care  Coordination of Care Other  Acuity Level 2  Time Spent with Patient 15

## 2019-01-22 ENCOUNTER — Telehealth: Payer: Self-pay | Admitting: *Deleted

## 2019-01-22 NOTE — Telephone Encounter (Signed)
Called patient to inform of MRI for 01-25-19 - arrival time - 12:30 pm @ [facility], no restrictions to test, spoke with patient and he is aware of this test

## 2019-01-25 ENCOUNTER — Ambulatory Visit (HOSPITAL_COMMUNITY)
Admission: RE | Admit: 2019-01-25 | Discharge: 2019-01-25 | Disposition: A | Discharge status: Home / Self Care | Payer: Medicare Other | Source: Ambulatory Visit | Attending: Radiation Oncology | Admitting: Radiation Oncology

## 2019-01-25 DIAGNOSIS — C3490 Malignant neoplasm of unspecified part of unspecified bronchus or lung: Secondary | ICD-10-CM | POA: Diagnosis not present

## 2019-01-25 MED ORDER — GADOBUTROL 1 MMOL/ML IV SOLN
8.5000 mL | Freq: Once | INTRAVENOUS | Status: AC | PRN
  Administered 2019-01-25: 8.5 mL via INTRAVENOUS

## 2019-01-26 ENCOUNTER — Telehealth: Payer: Self-pay | Admitting: *Deleted

## 2019-01-26 NOTE — Telephone Encounter (Signed)
Called the patient, unable to leave a voicemail.  Will attempt on Monday to reach the patient regarding radiation oncology plan.  Andrew West. Leonie Green, BSN

## 2019-01-26 NOTE — Telephone Encounter (Signed)
Called the patient, unable to leave a voicemail.  Will re-attempt as time permits.   Andrew West. Leonie Green, BSN

## 2019-01-29 ENCOUNTER — Telehealth: Payer: Self-pay | Admitting: Medical Oncology

## 2019-01-29 ENCOUNTER — Other Ambulatory Visit: Payer: Self-pay | Admitting: Internal Medicine

## 2019-01-29 MED FILL — HYDROCODON-APAP 5-325: 5-325 | 10 days supply | Qty: 40 | Fill #0

## 2019-01-29 NOTE — Telephone Encounter (Signed)
Asking  for pain med refill. Next ov may.

## 2019-01-31 ENCOUNTER — Ambulatory Visit: Payer: Medicare Other | Admitting: Radiation Oncology

## 2019-01-31 DIAGNOSIS — Z510 Encounter for antineoplastic radiation therapy: Secondary | ICD-10-CM | POA: Insufficient documentation

## 2019-01-31 DIAGNOSIS — C3411 Malignant neoplasm of upper lobe, right bronchus or lung: Secondary | ICD-10-CM | POA: Insufficient documentation

## 2019-02-05 ENCOUNTER — Other Ambulatory Visit: Payer: Self-pay | Admitting: Medical Oncology

## 2019-02-05 ENCOUNTER — Telehealth: Payer: Self-pay | Admitting: Medical Oncology

## 2019-02-05 DIAGNOSIS — C3411 Malignant neoplasm of upper lobe, right bronchus or lung: Secondary | ICD-10-CM

## 2019-02-05 NOTE — Telephone Encounter (Addendum)
Message left - "sick feeling, weak , dizzy,hurt in R chest". I returned his call said two words and was disconnected. Spoke to pt -he has ct sim in am and I told him he will have labs and smc after radiation appt.

## 2019-02-06 ENCOUNTER — Other Ambulatory Visit: Payer: Medicare Other

## 2019-02-06 ENCOUNTER — Telehealth: Payer: Self-pay | Admitting: Emergency Medicine

## 2019-02-06 ENCOUNTER — Other Ambulatory Visit: Payer: Self-pay | Admitting: Internal Medicine

## 2019-02-06 ENCOUNTER — Ambulatory Visit
Admission: RE | Admit: 2019-02-06 | Discharge: 2019-02-06 | Disposition: A | Discharge status: Home / Self Care | Payer: Medicare Other | Source: Ambulatory Visit | Attending: Radiation Oncology | Admitting: Radiation Oncology

## 2019-02-06 ENCOUNTER — Other Ambulatory Visit: Payer: Self-pay

## 2019-02-06 ENCOUNTER — Encounter: Payer: Medicare Other | Admitting: Medical

## 2019-02-06 DIAGNOSIS — C3411 Malignant neoplasm of upper lobe, right bronchus or lung: Secondary | ICD-10-CM | POA: Diagnosis not present

## 2019-02-06 DIAGNOSIS — Z298 Encounter for other specified prophylactic measures: Secondary | ICD-10-CM | POA: Diagnosis not present

## 2019-02-06 DIAGNOSIS — Z510 Encounter for antineoplastic radiation therapy: Secondary | ICD-10-CM | POA: Diagnosis not present

## 2019-02-06 MED FILL — ZOLPIDEM TARTRATE 5 MG TAB: 5 | 30 days supply | Qty: 30 | Fill #1

## 2019-02-06 NOTE — Telephone Encounter (Signed)
Pt did not check in for lab & [facility] appt after his radiation appt this am.  Called pt's cell phone.  Pt stated he felt so tired he didn't want to stay for the rest of his appts today.  Pt states he wants to stay home and try to get some rest and drink some water today instead of coming in for evaluation.  Pt says he feels stable enough to wait until tomorrow.  [facility] RN will call back tomorrow (02/07/2019) morning to check on pt and see if he still feels bad enough to come in.  Pt VU of this and to call back before then with any questions/concerns/changes.  Appts cancelled for today.  PA Lucianne Lei & MD [facility] aware.

## 2019-02-07 ENCOUNTER — Telehealth: Payer: Self-pay | Admitting: Emergency Medicine

## 2019-02-07 MED FILL — HYDROCODON-APAP 5-325: 5-325 | 10 days supply | Qty: 40 | Fill #0

## 2019-02-07 NOTE — Telephone Encounter (Signed)
Called pt back as scheduled this am to check on how he was feeling & whether he needed to come in today to [facility] for evaluation.  No answer on pt's home and mobile phone.  Left VM on approved number [phone]) asking pt to call back if he's continuing to not feel well.

## 2019-02-13 ENCOUNTER — Inpatient Hospital Stay: Payer: Medicare Other | Attending: Internal Medicine

## 2019-02-13 ENCOUNTER — Telehealth: Payer: Self-pay

## 2019-02-13 DIAGNOSIS — Z510 Encounter for antineoplastic radiation therapy: Secondary | ICD-10-CM | POA: Diagnosis not present

## 2019-02-13 DIAGNOSIS — Z298 Encounter for other specified prophylactic measures: Secondary | ICD-10-CM | POA: Diagnosis not present

## 2019-02-13 DIAGNOSIS — C3411 Malignant neoplasm of upper lobe, right bronchus or lung: Secondary | ICD-10-CM | POA: Diagnosis not present

## 2019-02-13 NOTE — Telephone Encounter (Signed)
Nutrition  Patient was notified on 3/23 that Nutrition visit would be conducted via phone.    Called patient, left voicemail message and asked patient to call RD back.    Tayshawn Purnell B. Zenia Resides, [place], [place] Registered Dietitian [phone] (pager)

## 2019-02-14 ENCOUNTER — Other Ambulatory Visit: Payer: Self-pay | Admitting: Medical Oncology

## 2019-02-14 ENCOUNTER — Other Ambulatory Visit: Payer: Self-pay | Admitting: Internal Medicine

## 2019-02-14 ENCOUNTER — Telehealth: Payer: Self-pay | Admitting: Medical Oncology

## 2019-02-14 ENCOUNTER — Other Ambulatory Visit: Payer: Self-pay

## 2019-02-14 ENCOUNTER — Ambulatory Visit
Admission: RE | Admit: 2019-02-14 | Discharge: 2019-02-14 | Disposition: A | Discharge status: Home / Self Care | Payer: Medicare Other | Source: Ambulatory Visit | Attending: Radiation Oncology | Admitting: Radiation Oncology

## 2019-02-14 ENCOUNTER — Telehealth: Payer: Self-pay

## 2019-02-14 DIAGNOSIS — C3411 Malignant neoplasm of upper lobe, right bronchus or lung: Secondary | ICD-10-CM | POA: Diagnosis not present

## 2019-02-14 DIAGNOSIS — Z298 Encounter for other specified prophylactic measures: Secondary | ICD-10-CM | POA: Diagnosis not present

## 2019-02-14 DIAGNOSIS — Z510 Encounter for antineoplastic radiation therapy: Secondary | ICD-10-CM | POA: Diagnosis not present

## 2019-02-14 MED ORDER — HYDROCODONE-ACETAMINOPHEN 5-325 MG PO TABS: ORAL_TABLET | ORAL | 0 refills | Status: DC

## 2019-02-14 MED FILL — HYDROCODON-APAP 5-325: 5-325 | 10 days supply | Qty: 40 | Fill #0

## 2019-02-14 NOTE — Telephone Encounter (Signed)
Nutrition  Called patient again for nutrition follow-up. Have not had return call back from message left on 3/24.  Left message again to call RD to discuss nutrition.  Call back number left for patient.   [name] B. Zenia Resides, [place], [place] Registered Dietitian [phone] (pager)

## 2019-02-14 NOTE — Telephone Encounter (Addendum)
Pain in R chest -  "sharp pain and med ( hydrocodone) not strong enough". I returned call and unable to leave message. Per Julien Nordmann I told pt to take ibuprofen. He said he was taking it in between hydorcodone. I said he may need to go to ED . He asked for refill.

## 2019-02-15 ENCOUNTER — Ambulatory Visit: Payer: Medicare Other

## 2019-02-16 ENCOUNTER — Other Ambulatory Visit: Payer: Self-pay

## 2019-02-16 ENCOUNTER — Ambulatory Visit
Admission: RE | Admit: 2019-02-16 | Discharge: 2019-02-16 | Disposition: A | Discharge status: Home / Self Care | Payer: Medicare Other | Source: Ambulatory Visit | Attending: Radiation Oncology | Admitting: Radiation Oncology

## 2019-02-16 ENCOUNTER — Other Ambulatory Visit: Payer: Self-pay | Admitting: Radiation Oncology

## 2019-02-16 DIAGNOSIS — Z510 Encounter for antineoplastic radiation therapy: Secondary | ICD-10-CM | POA: Diagnosis not present

## 2019-02-16 DIAGNOSIS — G4709 Other insomnia: Secondary | ICD-10-CM

## 2019-02-16 DIAGNOSIS — C3411 Malignant neoplasm of upper lobe, right bronchus or lung: Secondary | ICD-10-CM | POA: Diagnosis not present

## 2019-02-16 MED ORDER — ZOLPIDEM TARTRATE 5 MG PO TABS: 5.0000 mg | ORAL_TABLET | Freq: Every day | ORAL | 0 refills | Status: DC

## 2019-02-19 ENCOUNTER — Emergency Department (HOSPITAL_COMMUNITY)
Admission: EM | Admit: 2019-02-19 | Discharge: 2019-02-20 | Disposition: A | Discharge status: Home / Self Care | Payer: Medicare Other | Attending: Emergency Medicine | Admitting: Emergency Medicine

## 2019-02-19 ENCOUNTER — Encounter (HOSPITAL_COMMUNITY): Payer: Self-pay

## 2019-02-19 ENCOUNTER — Other Ambulatory Visit: Payer: Self-pay

## 2019-02-19 ENCOUNTER — Ambulatory Visit
Admission: RE | Admit: 2019-02-19 | Discharge: 2019-02-19 | Disposition: A | Discharge status: Home / Self Care | Payer: Medicare Other | Source: Ambulatory Visit | Attending: Radiation Oncology | Admitting: Radiation Oncology

## 2019-02-19 ENCOUNTER — Telehealth: Payer: Self-pay | Admitting: Medical Oncology

## 2019-02-19 DIAGNOSIS — N183 Chronic kidney disease, stage 3 (moderate): Secondary | ICD-10-CM | POA: Diagnosis not present

## 2019-02-19 DIAGNOSIS — Z87891 Personal history of nicotine dependence: Secondary | ICD-10-CM | POA: Insufficient documentation

## 2019-02-19 DIAGNOSIS — Z7901 Long term (current) use of anticoagulants: Secondary | ICD-10-CM | POA: Insufficient documentation

## 2019-02-19 DIAGNOSIS — I130 Hypertensive heart and chronic kidney disease with heart failure and stage 1 through stage 4 chronic kidney disease, or unspecified chronic kidney disease: Secondary | ICD-10-CM | POA: Insufficient documentation

## 2019-02-19 DIAGNOSIS — R079 Chest pain, unspecified: Secondary | ICD-10-CM | POA: Diagnosis not present

## 2019-02-19 DIAGNOSIS — G8929 Other chronic pain: Secondary | ICD-10-CM | POA: Diagnosis not present

## 2019-02-19 DIAGNOSIS — I2510 Atherosclerotic heart disease of native coronary artery without angina pectoris: Secondary | ICD-10-CM | POA: Insufficient documentation

## 2019-02-19 DIAGNOSIS — R1110 Vomiting, unspecified: Secondary | ICD-10-CM | POA: Diagnosis not present

## 2019-02-19 DIAGNOSIS — I5030 Unspecified diastolic (congestive) heart failure: Secondary | ICD-10-CM | POA: Insufficient documentation

## 2019-02-19 DIAGNOSIS — Z7982 Long term (current) use of aspirin: Secondary | ICD-10-CM | POA: Diagnosis not present

## 2019-02-19 DIAGNOSIS — J449 Chronic obstructive pulmonary disease, unspecified: Secondary | ICD-10-CM | POA: Diagnosis not present

## 2019-02-19 DIAGNOSIS — Z510 Encounter for antineoplastic radiation therapy: Secondary | ICD-10-CM | POA: Diagnosis not present

## 2019-02-19 DIAGNOSIS — C3411 Malignant neoplasm of upper lobe, right bronchus or lung: Secondary | ICD-10-CM | POA: Insufficient documentation

## 2019-02-19 DIAGNOSIS — R112 Nausea with vomiting, unspecified: Secondary | ICD-10-CM | POA: Diagnosis not present

## 2019-02-19 DIAGNOSIS — I129 Hypertensive chronic kidney disease with stage 1 through stage 4 chronic kidney disease, or unspecified chronic kidney disease: Secondary | ICD-10-CM | POA: Diagnosis not present

## 2019-02-19 DIAGNOSIS — Z79899 Other long term (current) drug therapy: Secondary | ICD-10-CM | POA: Diagnosis not present

## 2019-02-19 LAB — COMPREHENSIVE METABOLIC PANEL
ALT: 7 U/L (ref 0–44)
AST: 9 U/L — ABNORMAL LOW (ref 15–41)
Albumin: 3.3 g/dL — ABNORMAL LOW (ref 3.5–5.0)
Alkaline Phosphatase: 45 U/L (ref 38–126)
Anion gap: 7 (ref 5–15)
BUN: 18 mg/dL (ref 6–20)
CO2: 26 mmol/L (ref 22–32)
CREATININE: 1.56 mg/dL — AB (ref 0.61–1.24)
Calcium: 8.4 mg/dL — ABNORMAL LOW (ref 8.9–10.3)
Chloride: 109 mmol/L (ref 98–111)
GFR calc Af Amer: 58 mL/min — ABNORMAL LOW (ref >60)
GFR calc non Af Amer: 50 mL/min — ABNORMAL LOW (ref >60)
GLUCOSE: 79 mg/dL (ref 70–99)
Potassium: 4.1 mmol/L (ref 3.5–5.1)
Sodium: 142 mmol/L (ref 135–145)
Total Bilirubin: 0.4 mg/dL (ref 0.3–1.2)
Total Protein: 6.3 g/dL — ABNORMAL LOW (ref 6.5–8.1)

## 2019-02-19 LAB — CBC WITH DIFFERENTIAL/PLATELET
Abs Immature Granulocytes: 0.02 10*3/uL (ref 0.00–0.07)
Basophils Absolute: 0 10*3/uL (ref 0.0–0.1)
Basophils Relative: 0 %
Eosinophils Absolute: 0.1 10*3/uL (ref 0.0–0.5)
Eosinophils Relative: 2 %
HCT: 25.3 % — ABNORMAL LOW (ref 39.0–52.0)
Hemoglobin: 7.9 g/dL — ABNORMAL LOW (ref 13.0–17.0)
Immature Granulocytes: 0 %
LYMPHS ABS: 0.5 10*3/uL — AB (ref 0.7–4.0)
Lymphocytes Relative: 7 %
MCH: 29.7 pg (ref 26.0–34.0)
MCHC: 31.2 g/dL (ref 30.0–36.0)
MCV: 95.1 fL (ref 80.0–100.0)
Monocytes Absolute: 0.6 10*3/uL (ref 0.1–1.0)
Monocytes Relative: 9 %
Neutro Abs: 5.6 10*3/uL (ref 1.7–7.7)
Neutrophils Relative %: 82 %
Platelets: 155 10*3/uL (ref 150–400)
RBC: 2.66 MIL/uL — ABNORMAL LOW (ref 4.22–5.81)
RDW: 13.6 % (ref 11.5–15.5)
WBC: 6.9 10*3/uL (ref 4.0–10.5)
nRBC: 0 % (ref 0.0–0.2)

## 2019-02-19 LAB — LIPASE, BLOOD: Lipase: 27 U/L (ref 11–51)

## 2019-02-19 MED ORDER — ONDANSETRON HCL 4 MG/2ML IJ SOLN
4.0000 mg | Freq: Once | INTRAMUSCULAR | Status: AC
  Administered 2019-02-19: 4 mg via INTRAVENOUS
  Filled 2019-02-19: qty 2

## 2019-02-19 MED ORDER — SODIUM CHLORIDE 0.9 % IV BOLUS
1000.0000 mL | Freq: Once | INTRAVENOUS | Status: AC
  Administered 2019-02-19: 1000 mL via INTRAVENOUS

## 2019-02-19 MED ORDER — ONDANSETRON 4 MG PO TBDP: 4.0000 mg | ORAL_TABLET | Freq: Three times a day (TID) | ORAL | 0 refills | Status: DC | PRN

## 2019-02-19 MED ORDER — FENTANYL CITRATE (PF) 100 MCG/2ML IJ SOLN
50.0000 ug | Freq: Once | INTRAMUSCULAR | Status: AC
  Administered 2019-02-19: 50 ug via INTRAVENOUS
  Filled 2019-02-19: qty 2

## 2019-02-19 NOTE — ED Triage Notes (Signed)
Pt had chemo this and  Has been throwing up all day, he also complains of right sided chest pain and has lung cancer

## 2019-02-19 NOTE — ED Notes (Signed)
Patient reports vomiting x3 today after chemo treatment. Tried to take prescribed Zofran with no relief. Unable to tolerate anything PO.

## 2019-02-19 NOTE — ED Provider Notes (Signed)
[facility] DEPT Provider Note   CSN: [id number] Arrival date & time: 02/19/19  2059    History   Chief Complaint Chief Complaint  Patient presents with  . Emesis    HPI TRAVAUGHN VUE is a 53 y.o. male.     The history is provided by the patient and medical records. No language interpreter was used.  Emesis  Severity:  Severe Duration:  1 day Timing:  Constant Quality:  Stomach contents Progression:  Unchanged Chronicity:  New Recent urination:  Normal Relieved by:  Nothing Worsened by:  Nothing Ineffective treatments:  None tried Associated symptoms: no abdominal pain, no chills, no cough, no diarrhea, no fever, no headaches and no URI     Past Medical History:  Diagnosis Date  . Anxiety   . CAD (coronary artery disease)    a. s/p multiple PCIs with last cath 11/2016 with severe multivessel CAD, s/p PCTA to LCx but unable to pass stent  . Chronic leg pain    bilateral  . Chronic lower back pain   . COPD (chronic obstructive pulmonary disease) ([place])   . Depression   . GERD (gastroesophageal reflux disease)    Takes Dexilant  . HLD (hyperlipidemia)   . Hypertension   . Rhabdomyolysis    h/o, r/t statins  . Sleep apnea    "can't tolerate mask" (12/16/2016)  . Type II diabetes mellitus [facility])     Patient Active Problem List   Diagnosis Date Noted  . Protein-calorie malnutrition, severe 12/26/2018  . Symptomatic anemia 12/25/2018  . Syncope 12/25/2018  . CKD (chronic kidney disease) stage 3, GFR 30-59 ml/min (HCC) 12/09/2018  . Radiation esophagitis   . Malnutrition of moderate degree 12/06/2018  . Esophageal stricture   . N&V (nausea and vomiting) 12/01/2018  . Antineoplastic chemotherapy induced pancytopenia ([place]) 12/01/2018  . Odynophagia   . Dysphagia 10/08/2018  . Hypokalemia 10/08/2018  . Hypomagnesemia 10/08/2018  . Type 2 diabetes mellitus ([place]) 10/08/2018  . Anemia 10/08/2018  . Sleep apnea 10/08/2018  .  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 Goals of care, counseling/discussion 08/31/2018  . Encounter for antineoplastic chemotherapy 08/31/2018  . Encounter for smoking cessation counseling 08/31/2018  . Malignant neoplasm of bronchus of right upper lobe ([place]) 08/29/2018  . Mediastinal mass 08/17/2018  . Vitamin D deficiency 08/04/2018  . Diabetic peripheral neuropathy ([place]) 06/30/2018  . Substernal chest pain 02/25/2018  . Benign prostatic hyperplasia with urinary hesitancy 11/17/2017  . Preoperative cardiovascular examination 07/12/2017  . Refractory angina ([place]) 05/24/2017  . Complex regional pain syndrome type I 03/24/2017  . Progressive angina ([place]) -Class III 12/16/2016  . Anxiety 06/28/2016  . Diastolic dysfunction-grade 2 with EF 60-65% Oct 2016 03/22/2016  . Hypertension 10/03/2015  . CAD S/P multiple PCI's 10/03/2015  . Pulmonary nodule 06/24/2015  . Cough 06/24/2015  . COPD with asthma ([place]) 06/24/2015  . Reactive airway disease 06/04/2015  . DOE (dyspnea on exertion) 04/15/2015  . Lumbar disc herniation with radiculopathy 03/31/2012  . Presence of stent in left circumflex coronary artery 10/18/2011    Class: History of  . TOBACCO ABUSE 02/27/2009  . ABDOMINAL PAIN, LEFT LOWER QUADRANT 12/30/2008  . ABDOMINAL PAIN, EPIGASTRIC 12/05/2008  . Anxiety and depression 09/05/2008  . Hereditary and idiopathic peripheral neuropathy 09/05/2008  . GERD 09/05/2008  . Cervical disc disorder with radiculopathy of cervical region 08/19/2008  . HLD (hyperlipidemia) 04/25/2008    Class: Diagnosis of  .  ALLERGIC RHINITIS 04/25/2008  . Backache 04/25/2008  . Chest pain, unspecified 04/25/2008  . COLONIC POLYPS, HX OF 04/25/2008  . Obesity 04/18/2008    Class: Diagnosis of  . ANAL FISSURE, HX OF 04/18/2008  . Diabetes mellitus ([place]) 01/30/2008    Class: History of  . RECTAL BLEEDING 01/30/2008    Past Surgical History:  Procedure Laterality Date  .  BACK SURGERY    . BRONCHIAL BIOPSY  08/25/2018   Procedure: BRONCHIAL BIOPSIES;  Surgeon: Garner Nash, DO;  Location: [facility];  Service: Cardiopulmonary;;  . CARDIAC CATHETERIZATION N/A 09/25/2015   Procedure: Left Heart Cath and Coronary Angiography;  Surgeon: Leonie Man, MD;  Location: [facility] CV LAB;  Service: Cardiovascular;  Laterality: N/A;  . CARDIAC CATHETERIZATION N/A 12/16/2016   Procedure: Left Heart Cath and Coronary Angiography;  Surgeon: Leonie Man, MD;  Location: [facility] LAB;  Service: Cardiovascular;  Laterality: N/A;  . CARDIAC CATHETERIZATION N/A 12/16/2016   Procedure: Coronary Balloon Angioplasty;  Surgeon: Leonie Man, MD;  Location: [facility] LAB;  Service: Cardiovascular;  Laterality: N/A;  . COLONOSCOPY W/ POLYPECTOMY    . CORONARY ANGIOPLASTY  09/25/2015   mid cir & om  . CORONARY ANGIOPLASTY WITH STENT PLACEMENT  10/09/2001   PTCA & stenting of mid AV circumflex; 2.5x41m Pixel stent  . CORONARY ANGIOPLASTY WITH STENT PLACEMENT  12/13/2001   PCI with stent to mid L circumflex, 95% stenosis to 0% residual  . CORONARY ANGIOPLASTY WITH STENT PLACEMENT  10/10/2003   PCI to mid AV circumflex; LAD 30% disease; RCA 100% occluded prox.  . CORONARY ANGIOPLASTY WITH STENT PLACEMENT  09/01/2011   PCI with stenting with bare metal stent to mid AV groove circumflex and PDA  . CORONARY ANGIOPLASTY WITH STENT PLACEMENT  10/17/2011   cutting balloon angioplasty of ostial lateral OM1 branch and bifurcation AV groove circumflex OM junction; stenosis reduced to 0%  . ENDOBRONCHIAL ULTRASOUND Bilateral 08/25/2018   Procedure: ENDOBRONCHIAL ULTRASOUND;  Surgeon: IGarner Nash DO;  Location: [facility];  Service: Cardiopulmonary;  Laterality: Bilateral;  . ESOPHAGOGASTRODUODENOSCOPY (EGD) WITH PROPOFOL N/A 12/08/2018   Procedure: ESOPHAGOGASTRODUODENOSCOPY (EGD) WITH PROPOFOL;  Surgeon: JMilus Banister MD;  Location: [facility];  Service:  Endoscopy;  Laterality: N/A;  . EXCISIONAL HEMORRHOIDECTOMY    . FINE NEEDLE ASPIRATION  08/25/2018   Procedure: FINE NEEDLE ASPIRATION;  Surgeon: IGarner Nash DO;  Location: [facility];  Service: Cardiopulmonary;;  . FLEXIBLE BRONCHOSCOPY  08/25/2018   Procedure: FLEXIBLE BRONCHOSCOPY;  Surgeon: IGarner Nash DO;  Location: [facility];  Service: Cardiopulmonary;;  . IR GASTROSTOMY TUBE MOD SED  12/11/2018  . IR IMAGING GUIDED PORT INSERTION  09/29/2018  . LEFT HEART CATHETERIZATION WITH CORONARY ANGIOGRAM N/A 10/18/2011   Procedure: LEFT HEART CATHETERIZATION WITH CORONARY ANGIOGRAM;  Surgeon: DLeonie Man MD;  [facility];  Service: Cardiovascular;  Laterality: N/A;  . LUMBAR LAMINECTOMY/DECOMPRESSION MICRODISCECTOMY  03/31/2012   Procedure: LUMBAR LAMINECTOMY/DECOMPRESSION MICRODISCECTOMY 1 LEVEL;  Surgeon: HCharlie Pitter MD;  [facility];  Service: Neurosurgery;  Laterality: Left;  . TRANSTHORACIC ECHOCARDIOGRAM  07/28/2011   EF 55-65%; LVH, grade 1 diastolic dysfunction;         Home Medications    Prior to Admission medications   Medication Sig Start Date End Date Taking? Authorizing Provider  ACCU-CHEK FASTCLIX LANCETS MISC Use as directed Patient not taking: Reported on 01/11/2019 01/02/19   NCharlott Rakes MD  albuterol (VENTOLIN HFA)  108 (90 Base) MCG/ACT inhaler Inhale 1 puff into the lungs every 6 (six) hours as needed for wheezing or shortness of breath. 01/12/19   [name], MD  aspirin 81 MG tablet Take 1 tablet (81 mg total) daily by mouth. 10/10/17   [name], MD  benzonatate (TESSALON) 100 MG capsule Take 1 capsule (100 mg total) by mouth every 8 (eight) hours. Patient taking differently: Take 100 mg by mouth 3 (three) times daily as needed for cough.  09/19/18   Curt Bears, MD  Blood Glucose Monitoring Suppl (ACCU-CHEK GUIDE ME) w/Device KIT 1 each by Does not apply route daily. Patient not taking: Reported on 01/11/2019  01/02/19   [name], MD  Cholecalciferol (VITAMIN D) 2000 units tablet Take 1 tablet (2,000 Units total) by mouth daily. 08/04/18   Jamse Arn, MD  clopidogrel (PLAVIX) 75 MG tablet Take 1 tablet (75 mg total) by mouth daily. YOU MAY RESTART 08/26/2018 12/19/18   [name], MD  diclofenac sodium (VOLTAREN) 1 % GEL Apply 2 g topically 4 (four) times daily. 04/20/18   [name], MD  dronabinol (MARINOL) 5 MG capsule Take 1 capsule (5 mg total) by mouth 2 (two) times daily before a meal. 10/18/18   Tyler Pita, MD  FLUoxetine (PROZAC) 40 MG capsule Take 1 capsule (40 mg total) by mouth daily. 12/19/18   [name], MD  Fluticasone-Salmeterol (ADVAIR) 100-50 MCG/DOSE AEPB Inhale 1 puff into the lungs 2 (two) times daily. 12/19/18   [name], MD  glucose blood (ACCU-CHEK GUIDE) test strip Use as instructed Patient not taking: Reported on 01/11/2019 01/02/19   [name], MD  HYDROcodone-acetaminophen (NORCO/VICODIN) 5-325 MG tablet TAKE 1 TABLET BY MOUTH EVERY 6 HOURS AS NEEDED FOR MODERATE PAIN 02/14/19   Curt Bears, MD  LORazepam (ATIVAN) 0.5 MG tablet 1 tab po q 4-6 hours prn or 1 tab po 30 minutes prior to radiation 08/30/18   Hayden Pedro, PA-C  mirtazapine (REMERON) 30 MG tablet Take 1 tablet (30 mg total) by mouth at bedtime. 08/31/18   Curt Bears, MD  nebivolol (BYSTOLIC) 5 MG tablet Take 1 tablet (5 mg total) by mouth daily. 12/29/18   Kayleen Memos, DO  nicotine (NICODERM CQ) 21 mg/24hr patch Place 1 patch (21 mg total) onto the skin daily. 08/31/18   Curt Bears, MD  nitroGLYCERIN (NITROSTAT) 0.4 MG SL tablet Place 1 tablet (0.4 mg total) under the tongue every 5 (five) minutes as needed for chest pain. Patient taking differently: Place 0.4 mg under the tongue every 5 (five) minutes as needed for chest pain. Chest pain 12/24/16   Ahmed Prima, Fransisco Hertz, PA-C  ondansetron (ZOFRAN) 4 MG tablet Take 1 tablet (4 mg total) by mouth daily as  needed for nausea or vomiting. 12/28/18 12/28/19  Kayleen Memos, DO  pantoprazole (PROTONIX) 40 MG tablet Take 1 tablet (40 mg total) by mouth daily. 12/19/18   [name], MD  prochlorperazine (COMPAZINE) 10 MG tablet Take 1 tablet (10 mg total) by mouth every 6 (six) hours as needed for nausea or vomiting. 10/23/18   Curt Bears, MD  ranolazine (RANEXA) 1000 MG SR tablet Take 1 tablet (1,000 mg total) by mouth 2 (two) times daily. 05/24/17   Hilty, Nadean Corwin, MD  rosuvastatin (CRESTOR) 20 MG tablet Take 1 tablet (20 mg total) by mouth daily. 12/19/18   [name], MD  senna-docusate (SENOKOT-S) 8.6-50 MG tablet Take 2 tablets by mouth at bedtime. 12/28/18   Irene Pap  N, DO  Sterile Water LIQD Take 30 Bottles by mouth daily. 12/28/18   Kayleen Memos, DO  sucralfate (CARAFATE) 1 g tablet Take 1 tablet by mouth 4 (four) times daily -  before meals and at bedtime. 09/23/18   [provider]  tiotropium (SPIRIVA HANDIHALER) 18 MCG inhalation capsule Place 1 capsule (18 mcg total) into inhaler and inhale daily. 12/19/18   [name], MD  vitamin B-12 (CYANOCOBALAMIN) 500 MCG tablet Take 1 tablet (500 mcg total) by mouth daily. 06/30/18   Jamse Arn, MD  zolpidem (AMBIEN) 5 MG tablet Take 1 tablet (5 mg total) by mouth at bedtime. 02/16/19   Kyung Rudd, MD    Family History Family History  Problem Relation Age of Onset  . Heart attack Father   . Hypertension Mother   . Diabetes Mother   . Heart disease Brother        x 3   . Heart attack Brother        deceased  . Hypertension Sister   . Diabetes Sister   . Anesthesia problems Neg Hx   . Hypotension Neg Hx   . Malignant hyperthermia Neg Hx   . Pseudochol deficiency Neg Hx     Social History Social History   Tobacco Use  . Smoking status: Former Smoker    Packs/day: 0.25    Years: 25.00    Pack years: 6.25    Types: Cigarettes    Last attempt to quit: 05/24/2015    Years since quitting: 3.7  . Smokeless  tobacco: Never Used  Substance Use Topics  . Alcohol use: No    Alcohol/week: 0.0 standard drinks  . Drug use: No     Allergies   Iohexol   Review of Systems Review of Systems  Constitutional: Negative for chills, diaphoresis, fatigue and fever.  HENT: Negative for congestion.   Respiratory: Negative for cough, choking, chest tightness, shortness of breath, wheezing and stridor.   Cardiovascular: Negative for chest pain, palpitations and leg swelling.  Gastrointestinal: Positive for nausea and vomiting. Negative for abdominal distention, abdominal pain, constipation and diarrhea.  Genitourinary: Negative for flank pain and frequency.  Musculoskeletal: Negative for back pain, neck pain and neck stiffness.  Skin: Negative for rash and wound.  Neurological: Negative for dizziness, light-headedness and headaches.  Psychiatric/Behavioral: Negative for agitation.  All other systems reviewed and are negative.    Physical Exam Updated Vital Signs BP (!) 137/106 (BP Location: Right Arm)   Pulse 82   Temp 98.6 F (37 C) (Oral)   Resp 18   Ht 5' 9" (1.753 m)   Wt 86.2 kg   SpO2 100%   BMI 28.06 kg/m   Physical Exam Vitals signs and nursing note reviewed.  Constitutional:      General: He is not in acute distress.    Appearance: He is well-developed. He is not ill-appearing, toxic-appearing or diaphoretic.  HENT:     Head: Normocephalic and atraumatic.     Nose: Nose normal. No congestion or rhinorrhea.     Mouth/Throat:     Pharynx: No oropharyngeal exudate or posterior oropharyngeal erythema.  Eyes:     Conjunctiva/sclera: Conjunctivae normal.     Pupils: Pupils are equal, round, and reactive to light.  Neck:     Musculoskeletal: Neck supple. Muscular tenderness present.  Cardiovascular:     Rate and Rhythm: Regular rhythm. Tachycardia present.     Pulses: Normal pulses.     Heart sounds: No  murmur.  Pulmonary:     Effort: Pulmonary effort is normal. No respiratory  distress.     Breath sounds: Normal breath sounds. No wheezing, rhonchi or rales.  Chest:     Chest wall: No tenderness.  Abdominal:     General: There is no distension.     Palpations: Abdomen is soft.     Tenderness: There is no abdominal tenderness.  Musculoskeletal:     Right lower leg: No edema.     Left lower leg: No edema.  Skin:    General: Skin is warm and dry.  Neurological:     General: No focal deficit present.     Mental Status: He is alert and oriented to person, place, and time.  Psychiatric:        Mood and Affect: Mood normal.      ED Treatments / Results  Labs (all labs ordered are listed, but only abnormal results are displayed) Labs Reviewed  CBC WITH DIFFERENTIAL/PLATELET - Abnormal; Notable for the following components:      Result Value   RBC 2.66 (*)    Hemoglobin 7.9 (*)    HCT 25.3 (*)    Lymphs Abs 0.5 (*)    All other components within normal limits  COMPREHENSIVE METABOLIC PANEL - Abnormal; Notable for the following components:   Creatinine, Ser 1.56 (*)    Calcium 8.4 (*)    Total Protein 6.3 (*)    Albumin 3.3 (*)    AST 9 (*)    GFR calc non Af Amer 50 (*)    GFR calc Af Amer 58 (*)    All other components within normal limits  LIPASE, BLOOD    EKG None  Radiology No results found.  Procedures Procedures (including critical care time)  Medications Ordered in ED Medications  sodium chloride 0.9 % bolus 1,000 mL (1,000 mLs Intravenous New Bag/Given 02/19/19 2232)  sodium chloride 0.9 % bolus 1,000 mL (1,000 mLs Intravenous New Bag/Given 02/19/19 2232)  ondansetron (ZOFRAN) injection 4 mg (4 mg Intravenous Given 02/19/19 2232)  fentaNYL (SUBLIMAZE) injection 50 mcg (50 mcg Intravenous Given 02/19/19 2355)     Initial Impression / Assessment and Plan / ED Course  I have reviewed the triage vital signs and the nursing notes.  Pertinent labs & imaging results that were available during my care of the patient were reviewed by me  and considered in my medical decision making (see chart for details).        Andrew West is a 53 y.o. male with a past medical history significant for CAD status post PCI, CHF, hyperlipidemia, diabetes, obesity, and lung cancer currently on chemotherapy who presents for nausea and vomiting.  Patient reports that he had chemotherapy today and has had several episodes of nausea and vomiting.  He reports he is not tolerating p.o. and fluids.  He says he tried to take his oral Zofran and vomited it.  He does not have ODT at home.  He reports no fevers, chills, cough, congestion, or shortness of breath.  He does report chronic chest pain which is no different than baseline.  He denies leg pain leg swelling urinary symptoms constipation, or diarrhea.  On exam, lungs are clear.  Chest is slightly tender which he reports is at his baseline.  Abdomen is nontender.  No CVA tenderness.  Patient tachycardic on arrival but otherwise resting.  Shared decision made conversation held with patient.  Patient was offered work-up including chest x-ray and  cardiac labs however he feels that his chest pain is at his baseline he is more focused on getting symptom management and getting out of the hospital if possible.  He is primarily concerned that the nausea and vomiting and making sure he is not dehydrated or having electrolyte imbalance.  Patient will have CBC CMP and lipase.  Patient will have EKG and telemetry monitoring however will hold on chest x-ray after our conversation.  Patient given fluids and nausea medicine.  If patient is able to tolerate p.o. after rehydration and his labs are reassuring, anticipate discharge with Zofran ODT and close follow-up instructions.      Patient agrees with plan for discharge after he received a dose of pain medicine help with the discomfort.  His laboratory testing was similar to prior.  Hemoglobin slightly lower than before but similar to what it was been in the past.   Patient does not appear severely dehydrated based on his labs and his electrolytes are not significantly abnormal.  Patient agrees with discharge and will be given prescription for Zofran ODT.  Patient to be discharged shortly.   Final Clinical Impressions(s) / ED Diagnoses   Final diagnoses:  Non-intractable vomiting with nausea, unspecified vomiting type  Chronic chest pain    ED Discharge Orders         Ordered    ondansetron (ZOFRAN ODT) 4 MG disintegrating tablet  Every 8 hours PRN     02/19/19 2352         Clinical Impression: 1. Non-intractable vomiting with nausea, unspecified vomiting type   2. Chronic chest pain     Disposition: Discharge  Condition: Good  I have discussed the results, Dx and Tx plan with the pt(& family if present). He/she/they expressed understanding and agree(s) with the plan. Discharge instructions discussed at great length. Strict return precautions discussed and pt &/or family have verbalized understanding of the instructions. No further questions at time of discharge.    New Prescriptions   ONDANSETRON (ZOFRAN ODT) 4 MG DISINTEGRATING TABLET    Take 1 tablet (4 mg total) by mouth every 8 (eight) hours as needed for nausea or vomiting.    Follow Up: [name], MD [place] [id number] [phone]     [place] DEPT [place] [id number] mc [place]       , Gwenyth Allegra, MD 02/20/19 0000

## 2019-02-19 NOTE — Discharge Instructions (Addendum)
Your laboratory testing today was similar to prior.  We do not feel you are significantly dehydrated and need to be admitted at this time.  We were able to rehydrate you with IV fluids and get your nausea improved with IV nausea medicine.  We will watch her prescription for the new nausea medicine that dissolves under your tongue instead of having to swallow.  Please use this at home.  Please maintain hydration.  After our shared decision-making conversation about your chest pain, we together elected to not pursue further imaging or labs surrounding her chronic chest pain that is unchanged.  If this changes, please consider return to the emergency department.    Please follow-up with your oncology team for further management.  Please rest.  If any symptoms change or worsen or you are unable to maintain hydration, please return to the nearest emergency department.

## 2019-02-19 NOTE — Telephone Encounter (Signed)
Weak, vomiting clear liquids. Yesterday he ate chicken , macaroni and mashed potatoes and vomited clear emesis. Today he fed via g tube and vomited clear liquids x 1 .Took zofran at 1330. "I have chest cavity pain ,it hurts bad I feel like a dog. Can you let me go to hospital". Denies fever or other symptoms. Receiving brain irradiation.  Per Dr Julien Nordmann I told pt to go to ED for evaluation

## 2019-02-20 ENCOUNTER — Other Ambulatory Visit: Payer: Self-pay

## 2019-02-20 ENCOUNTER — Ambulatory Visit
Admission: RE | Admit: 2019-02-20 | Discharge: 2019-02-20 | Disposition: A | Discharge status: Home / Self Care | Payer: Medicare Other | Source: Ambulatory Visit | Attending: Radiation Oncology | Admitting: Radiation Oncology

## 2019-02-20 DIAGNOSIS — C3411 Malignant neoplasm of upper lobe, right bronchus or lung: Secondary | ICD-10-CM | POA: Diagnosis not present

## 2019-02-20 DIAGNOSIS — R1110 Vomiting, unspecified: Secondary | ICD-10-CM | POA: Diagnosis not present

## 2019-02-20 DIAGNOSIS — Z510 Encounter for antineoplastic radiation therapy: Secondary | ICD-10-CM | POA: Diagnosis not present

## 2019-02-20 MED ORDER — HEPARIN SOD (PORK) LOCK FLUSH 100 UNIT/ML IV SOLN
500.0000 [IU] | Freq: Once | INTRAVENOUS | Status: AC
  Administered 2019-02-20: 500 [IU]
  Filled 2019-02-20: qty 5

## 2019-02-21 ENCOUNTER — Ambulatory Visit: Payer: Medicare Other

## 2019-02-22 ENCOUNTER — Other Ambulatory Visit: Payer: Self-pay

## 2019-02-22 ENCOUNTER — Ambulatory Visit
Admission: RE | Admit: 2019-02-22 | Discharge: 2019-02-22 | Disposition: A | Discharge status: Home / Self Care | Payer: Medicare Other | Source: Ambulatory Visit | Attending: Radiation Oncology | Admitting: Radiation Oncology

## 2019-02-22 DIAGNOSIS — Z510 Encounter for antineoplastic radiation therapy: Secondary | ICD-10-CM | POA: Insufficient documentation

## 2019-02-22 DIAGNOSIS — Z298 Encounter for other specified prophylactic measures: Secondary | ICD-10-CM | POA: Diagnosis not present

## 2019-02-22 DIAGNOSIS — C3411 Malignant neoplasm of upper lobe, right bronchus or lung: Secondary | ICD-10-CM | POA: Diagnosis not present

## 2019-02-23 ENCOUNTER — Ambulatory Visit
Admission: RE | Admit: 2019-02-23 | Discharge: 2019-02-23 | Disposition: A | Discharge status: Home / Self Care | Payer: Medicare Other | Source: Ambulatory Visit | Attending: Radiation Oncology | Admitting: Radiation Oncology

## 2019-02-23 ENCOUNTER — Other Ambulatory Visit: Payer: Self-pay

## 2019-02-23 DIAGNOSIS — Z510 Encounter for antineoplastic radiation therapy: Secondary | ICD-10-CM | POA: Diagnosis not present

## 2019-02-23 DIAGNOSIS — C3411 Malignant neoplasm of upper lobe, right bronchus or lung: Secondary | ICD-10-CM | POA: Diagnosis not present

## 2019-02-26 ENCOUNTER — Ambulatory Visit
Admission: RE | Admit: 2019-02-26 | Discharge: 2019-02-26 | Disposition: A | Discharge status: Home / Self Care | Payer: Medicare Other | Source: Ambulatory Visit | Attending: Radiation Oncology | Admitting: Radiation Oncology

## 2019-02-26 ENCOUNTER — Telehealth: Payer: Self-pay | Admitting: Medical Oncology

## 2019-02-26 ENCOUNTER — Other Ambulatory Visit: Payer: Self-pay

## 2019-02-26 DIAGNOSIS — Z510 Encounter for antineoplastic radiation therapy: Secondary | ICD-10-CM | POA: Diagnosis not present

## 2019-02-26 DIAGNOSIS — C3411 Malignant neoplasm of upper lobe, right bronchus or lung: Secondary | ICD-10-CM | POA: Diagnosis not present

## 2019-02-26 NOTE — Telephone Encounter (Signed)
Pain in chest and head.

## 2019-02-26 NOTE — Telephone Encounter (Signed)
He needs to be evaluated for his pain management by Lucianne Lei.  He is currently on observation and I cannot continue prescribing his narcotic.  He may consider ibuprofen if no evidence of bone metastasis or other lesion in that area.

## 2019-02-27 ENCOUNTER — Ambulatory Visit
Admission: RE | Admit: 2019-02-27 | Discharge: 2019-02-27 | Disposition: A | Discharge status: Home / Self Care | Payer: Medicare Other | Source: Ambulatory Visit | Attending: Radiation Oncology | Admitting: Radiation Oncology

## 2019-02-27 ENCOUNTER — Other Ambulatory Visit: Payer: Self-pay

## 2019-02-27 ENCOUNTER — Telehealth: Payer: Self-pay | Admitting: *Deleted

## 2019-02-27 ENCOUNTER — Ambulatory Visit: Payer: Medicare Other

## 2019-02-27 ENCOUNTER — Inpatient Hospital Stay: Payer: Medicare Other | Attending: Internal Medicine | Admitting: Medical

## 2019-02-27 ENCOUNTER — Telehealth: Payer: Self-pay | Admitting: Emergency Medicine

## 2019-02-27 DIAGNOSIS — Z298 Encounter for other specified prophylactic measures: Secondary | ICD-10-CM | POA: Diagnosis not present

## 2019-02-27 DIAGNOSIS — Z510 Encounter for antineoplastic radiation therapy: Secondary | ICD-10-CM | POA: Diagnosis not present

## 2019-02-27 DIAGNOSIS — C3411 Malignant neoplasm of upper lobe, right bronchus or lung: Secondary | ICD-10-CM | POA: Diagnosis not present

## 2019-02-27 MED FILL — ONDANSETRON ODT 4 MG TABLET: 4 | 6 days supply | Qty: 20 | Fill #0

## 2019-02-27 NOTE — Telephone Encounter (Signed)
Received call from patient regarding having his port removed and feeding tube taken out. Per Dr. Julien Nordmann, patient needs to be seen by Dr. Julien Nordmann before that final decision. Pt made aware.

## 2019-02-27 NOTE — Telephone Encounter (Signed)
Pt did not respond to being called multiple times by RN Learta Codding or NT Maudie Mercury.  Pt not found in cancer center after filling out walk in form.  PA Lucianne Lei aware, appt marked no-show.  After this pt called RN Eustaquio Maize for [facility] desk, states that he told front desk he had to leave for cardiology appt.  Registration reports no record of this interaction.  Pt will be seen at 0900 on 02/28/2019 in [facility] for pain management.  RN Beth to alert pt of appt.

## 2019-02-27 NOTE — Telephone Encounter (Signed)
Received TC from patient. He states he had to leave for a cardiology appt before he could be seen in [facility] today. Rescheduled for 02/28/19 @ 9am.

## 2019-02-28 ENCOUNTER — Encounter: Payer: Self-pay | Admitting: Radiation Oncology

## 2019-02-28 ENCOUNTER — Encounter: Payer: Medicare Other | Admitting: Medical

## 2019-02-28 ENCOUNTER — Ambulatory Visit: Payer: Medicare Other

## 2019-03-01 ENCOUNTER — Ambulatory Visit: Payer: Medicare Other

## 2019-03-02 ENCOUNTER — Ambulatory Visit: Payer: Medicare Other

## 2019-03-04 ENCOUNTER — Other Ambulatory Visit: Payer: Self-pay

## 2019-03-04 ENCOUNTER — Emergency Department (HOSPITAL_COMMUNITY): Payer: Medicare Other

## 2019-03-04 ENCOUNTER — Emergency Department (HOSPITAL_COMMUNITY)
Admission: EM | Admit: 2019-03-04 | Discharge: 2019-03-04 | Disposition: A | Discharge status: Home / Self Care | Payer: Medicare Other | Attending: Emergency Medicine | Admitting: Emergency Medicine

## 2019-03-04 ENCOUNTER — Encounter (HOSPITAL_COMMUNITY): Payer: Self-pay

## 2019-03-04 DIAGNOSIS — Z859 Personal history of malignant neoplasm, unspecified: Secondary | ICD-10-CM | POA: Insufficient documentation

## 2019-03-04 DIAGNOSIS — Z7982 Long term (current) use of aspirin: Secondary | ICD-10-CM | POA: Insufficient documentation

## 2019-03-04 DIAGNOSIS — J90 Pleural effusion, not elsewhere classified: Secondary | ICD-10-CM | POA: Diagnosis not present

## 2019-03-04 DIAGNOSIS — J449 Chronic obstructive pulmonary disease, unspecified: Secondary | ICD-10-CM | POA: Diagnosis not present

## 2019-03-04 DIAGNOSIS — I129 Hypertensive chronic kidney disease with stage 1 through stage 4 chronic kidney disease, or unspecified chronic kidney disease: Secondary | ICD-10-CM | POA: Insufficient documentation

## 2019-03-04 DIAGNOSIS — N183 Chronic kidney disease, stage 3 (moderate): Secondary | ICD-10-CM | POA: Insufficient documentation

## 2019-03-04 DIAGNOSIS — R918 Other nonspecific abnormal finding of lung field: Secondary | ICD-10-CM | POA: Diagnosis not present

## 2019-03-04 DIAGNOSIS — R112 Nausea with vomiting, unspecified: Secondary | ICD-10-CM | POA: Diagnosis not present

## 2019-03-04 DIAGNOSIS — Z87891 Personal history of nicotine dependence: Secondary | ICD-10-CM | POA: Insufficient documentation

## 2019-03-04 DIAGNOSIS — Z79899 Other long term (current) drug therapy: Secondary | ICD-10-CM | POA: Insufficient documentation

## 2019-03-04 DIAGNOSIS — I10 Essential (primary) hypertension: Secondary | ICD-10-CM | POA: Diagnosis not present

## 2019-03-04 DIAGNOSIS — Z85118 Personal history of other malignant neoplasm of bronchus and lung: Secondary | ICD-10-CM | POA: Diagnosis not present

## 2019-03-04 DIAGNOSIS — R1010 Upper abdominal pain, unspecified: Secondary | ICD-10-CM | POA: Insufficient documentation

## 2019-03-04 DIAGNOSIS — I2510 Atherosclerotic heart disease of native coronary artery without angina pectoris: Secondary | ICD-10-CM | POA: Diagnosis not present

## 2019-03-04 DIAGNOSIS — E119 Type 2 diabetes mellitus without complications: Secondary | ICD-10-CM | POA: Insufficient documentation

## 2019-03-04 HISTORY — DX: Malignant (primary) neoplasm, unspecified: C80.1

## 2019-03-04 LAB — CBC WITH DIFFERENTIAL/PLATELET
Abs Immature Granulocytes: 0.01 10*3/uL (ref 0.00–0.07)
Basophils Absolute: 0 10*3/uL (ref 0.0–0.1)
Basophils Relative: 0 %
Eosinophils Absolute: 0.1 10*3/uL (ref 0.0–0.5)
Eosinophils Relative: 1 %
HCT: 31.9 % — ABNORMAL LOW (ref 39.0–52.0)
Hemoglobin: 9.6 g/dL — ABNORMAL LOW (ref 13.0–17.0)
Immature Granulocytes: 0 %
Lymphocytes Relative: 8 %
Lymphs Abs: 0.5 10*3/uL — ABNORMAL LOW (ref 0.7–4.0)
MCH: 28.5 pg (ref 26.0–34.0)
MCHC: 30.1 g/dL (ref 30.0–36.0)
MCV: 94.7 fL (ref 80.0–100.0)
Monocytes Absolute: 0.6 10*3/uL (ref 0.1–1.0)
Monocytes Relative: 11 %
Neutro Abs: 4.8 10*3/uL (ref 1.7–7.7)
Neutrophils Relative %: 80 %
Platelets: 212 10*3/uL (ref 150–400)
RBC: 3.37 MIL/uL — ABNORMAL LOW (ref 4.22–5.81)
RDW: 13 % (ref 11.5–15.5)
WBC: 5.9 10*3/uL (ref 4.0–10.5)
nRBC: 0 % (ref 0.0–0.2)

## 2019-03-04 LAB — LIPASE, BLOOD: Lipase: 26 U/L (ref 11–51)

## 2019-03-04 LAB — COMPREHENSIVE METABOLIC PANEL
ALT: 5 U/L (ref 0–44)
AST: 11 U/L — ABNORMAL LOW (ref 15–41)
Albumin: 3.6 g/dL (ref 3.5–5.0)
Alkaline Phosphatase: 51 U/L (ref 38–126)
Anion gap: 7 (ref 5–15)
BUN: 24 mg/dL — ABNORMAL HIGH (ref 6–20)
CO2: 28 mmol/L (ref 22–32)
Calcium: 9 mg/dL (ref 8.9–10.3)
Chloride: 105 mmol/L (ref 98–111)
Creatinine, Ser: 1.77 mg/dL — ABNORMAL HIGH (ref 0.61–1.24)
GFR calc Af Amer: 50 mL/min — ABNORMAL LOW (ref >60)
GFR calc non Af Amer: 43 mL/min — ABNORMAL LOW (ref >60)
Glucose, Bld: 92 mg/dL (ref 70–99)
Potassium: 4.2 mmol/L (ref 3.5–5.1)
Sodium: 140 mmol/L (ref 135–145)
Total Bilirubin: 0.5 mg/dL (ref 0.3–1.2)
Total Protein: 7.3 g/dL (ref 6.5–8.1)

## 2019-03-04 LAB — URINALYSIS, ROUTINE W REFLEX MICROSCOPIC
Bilirubin Urine: NEGATIVE
Glucose, UA: NEGATIVE mg/dL
Hgb urine dipstick: NEGATIVE
Ketones, ur: NEGATIVE mg/dL
Leukocytes,Ua: NEGATIVE
Nitrite: NEGATIVE
Protein, ur: NEGATIVE mg/dL
Specific Gravity, Urine: 1.002 — ABNORMAL LOW (ref 1.005–1.030)
pH: 6 (ref 5.0–8.0)

## 2019-03-04 LAB — TROPONIN I: Troponin I: <0.03 ng/mL (ref <0.03)

## 2019-03-04 MED ORDER — PROMETHAZINE HCL 25 MG PO TABS: 25.0000 mg | ORAL_TABLET | Freq: Four times a day (QID) | ORAL | 0 refills | Status: DC | PRN

## 2019-03-04 MED ORDER — HYDROCODONE-ACETAMINOPHEN 5-325 MG PO TABS: 1.0000 | ORAL_TABLET | ORAL | 0 refills | Status: DC | PRN

## 2019-03-04 MED ORDER — HYDROMORPHONE HCL 1 MG/ML IJ SOLN
1.0000 mg | Freq: Once | INTRAMUSCULAR | Status: AC
  Administered 2019-03-04: 1 mg via INTRAVENOUS
  Filled 2019-03-04: qty 1

## 2019-03-04 MED ORDER — SODIUM CHLORIDE 0.9 % IV BOLUS
1000.0000 mL | Freq: Once | INTRAVENOUS | Status: AC
  Administered 2019-03-04: 19:00:00 1000 mL via INTRAVENOUS

## 2019-03-04 MED ORDER — HYDROMORPHONE HCL 1 MG/ML IJ SOLN
1.0000 mg | Freq: Once | INTRAMUSCULAR | Status: AC
  Administered 2019-03-04: 21:00:00 1 mg via INTRAVENOUS
  Filled 2019-03-04: qty 1

## 2019-03-04 MED ORDER — PROMETHAZINE HCL 25 MG RE SUPP: 25.0000 mg | Freq: Four times a day (QID) | RECTAL | 0 refills | Status: DC | PRN

## 2019-03-04 MED ORDER — SODIUM CHLORIDE 0.9 % IV BOLUS: 500.0000 mL | Freq: Once | INTRAVENOUS | Status: DC

## 2019-03-04 MED ORDER — PROMETHAZINE HCL 25 MG/ML IJ SOLN
25.0000 mg | Freq: Once | INTRAMUSCULAR | Status: AC
  Administered 2019-03-04: 25 mg via INTRAVENOUS
  Filled 2019-03-04: qty 1

## 2019-03-04 MED ORDER — PROMETHAZINE HCL 25 MG/ML IJ SOLN
25.0000 mg | Freq: Once | INTRAMUSCULAR | Status: AC
  Administered 2019-03-04: 19:00:00 25 mg via INTRAVENOUS
  Filled 2019-03-04: qty 1

## 2019-03-04 MED ORDER — SODIUM CHLORIDE 0.9% FLUSH
3.0000 mL | Freq: Once | INTRAVENOUS | Status: AC
  Administered 2019-03-04: 17:00:00 3 mL via INTRAVENOUS

## 2019-03-04 MED ORDER — SODIUM CHLORIDE 0.9 % IV BOLUS
500.0000 mL | Freq: Once | INTRAVENOUS | Status: AC
  Administered 2019-03-04: 17:00:00 500 mL via INTRAVENOUS

## 2019-03-04 NOTE — ED Notes (Signed)
Pt states not able to urine at this moment.

## 2019-03-04 NOTE — ED Provider Notes (Signed)
[place] DEPT Provider Note   CSN: [id number] Arrival date & time: 03/04/19  1640    History   Chief Complaint Chief Complaint  Patient presents with   Emesis   cancer patient    HPI CAYNE YOM is a 53 y.o. male presenting for evaluation nausea and vomiting.  Patient states the past 2 days, he has been having persistent nausea and vomiting.  He has been unable to tolerate any p.o.  Patient also reports right lower chest pain which is constant, worse when he coughs, takes a deep breath in, or vomits.  This pain has been present for longer than the past several days, but he cannot quantify how long.  It has not worsened.  Additionally, patient reports upper abdominal pain, mostly surrounding his PEG tube.  PEG tube was placed several months ago.  He denies known trauma or injury to the PEG tube.  Patient states his abdominal pain is chronic.  Patient is currently receiving radiation for lung cancer with mets to the brain.  Last radiation was 5 days ago.  Patient reports frequent post radiation nausea.  He is taking his at home and Zofran without improvement of symptoms.  Patient states he also has a history of CAD, COPD, GERD, hypertension, and diabetes.  He has been unable to take any of his medications due to vomiting.  Additional history obtained from chart review.  Patient seen 2 weeks ago for similar.  Patient states he feels much worse this time than last time.     HPI  Past Medical History:  Diagnosis Date   Anxiety    CAD (coronary artery disease)    a. s/p multiple PCIs with last cath 11/2016 with severe multivessel CAD, s/p PCTA to LCx but unable to pass stent   Cancer ([place])    Chronic leg pain    bilateral   Chronic lower back pain    COPD (chronic obstructive pulmonary disease) ([place])    Depression    GERD (gastroesophageal reflux disease)    Takes Dexilant   HLD (hyperlipidemia)    Hypertension    Rhabdomyolysis      h/o, r/t statins   Sleep apnea    "can't tolerate mask" (12/16/2016)   Type II diabetes mellitus ([place])     Patient Active Problem List   Diagnosis Date Noted   Protein-calorie malnutrition, severe 12/26/2018   Symptomatic anemia 12/25/2018   Syncope 12/25/2018   CKD (chronic kidney disease) stage 3, GFR 30-59 ml/min (HCC) 12/09/2018   Radiation esophagitis    Malnutrition of moderate degree 12/06/2018   Esophageal stricture    N&V (nausea and vomiting) 12/01/2018   Antineoplastic chemotherapy induced pancytopenia ([place]) 12/01/2018   Odynophagia    Dysphagia 10/08/2018   Hypokalemia 10/08/2018   Hypomagnesemia 10/08/2018   Type 2 diabetes mellitus ([place]) 10/08/2018   Anemia 10/08/2018   Sleep apnea 10/08/2018   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[place]) 08/31/2018   Goals of care, counseling/discussion 08/31/2018   Encounter for antineoplastic chemotherapy 08/31/2018   Encounter for smoking cessation counseling 08/31/2018   Malignant neoplasm of bronchus of right upper lobe ([place]) 08/29/2018   Mediastinal mass 08/17/2018   Vitamin D deficiency 08/04/2018   Diabetic peripheral neuropathy ([place]) 06/30/2018   Substernal chest pain 02/25/2018   Benign prostatic hyperplasia with urinary hesitancy 11/17/2017   Preoperative cardiovascular examination 07/12/2017   Refractory angina ([place]) 05/24/2017   Complex regional pain syndrome type I 03/24/2017  Progressive angina (HCC) -Class III 12/16/2016   Anxiety 03/47/4259   Diastolic dysfunction-grade 2 with EF 60-65% Oct 2016 03/22/2016   Hypertension 10/03/2015   CAD S/P multiple PCI's 10/03/2015   Pulmonary nodule 06/24/2015   Cough 06/24/2015   COPD with asthma ([place]) 06/24/2015   Reactive airway disease 06/04/2015   DOE (dyspnea on exertion) 04/15/2015   Lumbar disc herniation with radiculopathy 03/31/2012   Presence of stent in left circumflex coronary artery 10/18/2011     Class: History of   TOBACCO ABUSE 02/27/2009   ABDOMINAL PAIN, LEFT LOWER QUADRANT 12/30/2008   ABDOMINAL PAIN, EPIGASTRIC 12/05/2008   Anxiety and depression 09/05/2008   Hereditary and idiopathic peripheral neuropathy 09/05/2008   GERD 09/05/2008   Cervical disc disorder with radiculopathy of cervical region 08/19/2008   HLD (hyperlipidemia) 04/25/2008    Class: Diagnosis of   ALLERGIC RHINITIS 04/25/2008   Backache 04/25/2008   Chest pain, unspecified 04/25/2008   COLONIC POLYPS, HX OF 04/25/2008   Obesity 04/18/2008    Class: Diagnosis of   ANAL FISSURE, HX OF 04/18/2008   Diabetes mellitus ([place]) 01/30/2008    Class: History of   RECTAL BLEEDING 01/30/2008    Past Surgical History:  Procedure Laterality Date   BACK SURGERY     BRONCHIAL BIOPSY  08/25/2018   Procedure: BRONCHIAL BIOPSIES;  Surgeon: Garner Nash, DO;  Location: [facility];  Service: Cardiopulmonary;;   CARDIAC CATHETERIZATION N/A 09/25/2015   Procedure: Left Heart Cath and Coronary Angiography;  Surgeon: Leonie Man, MD;  Location: [facility];  Service: Cardiovascular;  Laterality: N/A;   CARDIAC CATHETERIZATION N/A 12/16/2016   Procedure: Left Heart Cath and Coronary Angiography;  Surgeon: Leonie Man, MD;  Location: [facility];  Service: Cardiovascular;  Laterality: N/A;   CARDIAC CATHETERIZATION N/A 12/16/2016   Procedure: Coronary Balloon Angioplasty;  Surgeon: Leonie Man, MD;  Location: [facility];  Service: Cardiovascular;  Laterality: N/A;   COLONOSCOPY W/ POLYPECTOMY     CORONARY ANGIOPLASTY  09/25/2015   mid cir & om   CORONARY ANGIOPLASTY WITH STENT PLACEMENT  10/09/2001   PTCA & stenting of mid AV circumflex; 2.5x41m Pixel stent   CORONARY ANGIOPLASTY WITH STENT PLACEMENT  12/13/2001   PCI with stent to mid L circumflex, 95% stenosis to 0% residual   CORONARY ANGIOPLASTY WITH STENT PLACEMENT  10/10/2003   PCI to mid AV circumflex;  LAD 30% disease; RCA 100% occluded prox.   CORONARY ANGIOPLASTY WITH STENT PLACEMENT  09/01/2011   PCI with stenting with bare metal stent to mid AV groove circumflex and PDA   CORONARY ANGIOPLASTY WITH STENT PLACEMENT  10/17/2011   cutting balloon angioplasty of ostial lateral OM1 branch and bifurcation AV groove circumflex OM junction; stenosis reduced to 0%   ENDOBRONCHIAL ULTRASOUND Bilateral 08/25/2018   Procedure: ENDOBRONCHIAL ULTRASOUND;  Surgeon: IGarner Nash DO;  Location: [facility];  Service: Cardiopulmonary;  Laterality: Bilateral;   ESOPHAGOGASTRODUODENOSCOPY (EGD) WITH PROPOFOL N/A 12/08/2018   Procedure: ESOPHAGOGASTRODUODENOSCOPY (EGD) WITH PROPOFOL;  Surgeon: JMilus Banister MD;  Location: [facility];  Service: Endoscopy;  Laterality: N/A;   EXCISIONAL HEMORRHOIDECTOMY     FINE NEEDLE ASPIRATION  08/25/2018   Procedure: FINE NEEDLE ASPIRATION;  Surgeon: IGarner Nash DO;  Location: [facility];  Service: Cardiopulmonary;;   FLEXIBLE BRONCHOSCOPY  08/25/2018   Procedure: FLEXIBLE BRONCHOSCOPY;  Surgeon: IGarner Nash DO;  Location: [facility];  Service: Cardiopulmonary;;   IR GASTROSTOMY TUBE MOD  SED  12/11/2018   IR IMAGING GUIDED PORT INSERTION  09/29/2018   LEFT HEART CATHETERIZATION WITH CORONARY ANGIOGRAM N/A 10/18/2011   Procedure: LEFT HEART CATHETERIZATION WITH CORONARY ANGIOGRAM;  Surgeon: Leonie Man, MD;  Location[facility];  Service: Cardiovascular;  Laterality: N/A;   LUMBAR LAMINECTOMY/DECOMPRESSION MICRODISCECTOMY  03/31/2012   Procedure: LUMBAR LAMINECTOMY/DECOMPRESSION MICRODISCECTOMY 1 LEVEL;  Surgeon: Charlie Pitter, MD;  Location: [facility];  Service: Neurosurgery;  Laterality: Left;   TRANSTHORACIC ECHOCARDIOGRAM  07/28/2011   EF 55-65%; LVH, grade 1 diastolic dysfunction;         Home Medications    Prior to Admission medications   Medication Sig Start Date End Date Taking? Authorizing Provider  ACCU-CHEK FASTCLIX  LANCETS MISC Use as directed Patient not taking: Reported on 01/11/2019 01/02/19   [name], MD  albuterol (VENTOLIN HFA) 108 (90 Base) MCG/ACT inhaler Inhale 1 puff into the lungs every 6 (six) hours as needed for wheezing or shortness of breath. 01/12/19   [name], MD  aspirin 81 MG tablet Take 1 tablet (81 mg total) daily by mouth. 10/10/17   [name], MD  benzonatate (TESSALON) 100 MG capsule Take 1 capsule (100 mg total) by mouth every 8 (eight) hours. Patient not taking: Reported on 02/19/2019 09/19/18   Curt Bears, MD  Blood Glucose Monitoring Suppl (ACCU-CHEK GUIDE ME) w/Device KIT 1 each by Does not apply route daily. Patient not taking: Reported on 01/11/2019 01/02/19   [name], MD  Cholecalciferol (VITAMIN D) 2000 units tablet Take 1 tablet (2,000 Units total) by mouth daily. 08/04/18   Jamse Arn, MD  clopidogrel (PLAVIX) 75 MG tablet Take 1 tablet (75 mg total) by mouth daily. YOU MAY RESTART 08/26/2018 12/19/18   [name], MD  diclofenac sodium (VOLTAREN) 1 % GEL Apply 2 g topically 4 (four) times daily. 04/20/18   [name], MD  dronabinol (MARINOL) 5 MG capsule Take 1 capsule (5 mg total) by mouth 2 (two) times daily before a meal. 10/18/18   Tyler Pita, MD  FLUoxetine (PROZAC) 40 MG capsule Take 1 capsule (40 mg total) by mouth daily. 12/19/18   [name], MD  Fluticasone-Salmeterol (ADVAIR) 100-50 MCG/DOSE AEPB Inhale 1 puff into the lungs 2 (two) times daily. 12/19/18   [name], MD  glucose blood (ACCU-CHEK GUIDE) test strip Use as instructed Patient not taking: Reported on 01/11/2019 01/02/19   [name], MD  HYDROcodone-acetaminophen (NORCO/VICODIN) 5-325 MG tablet Take 1 tablet by mouth every 4 (four) hours as needed. 03/04/19   Arkie Tagliaferro, PA-C  LORazepam (ATIVAN) 0.5 MG tablet 1 tab po q 4-6 hours prn or 1 tab po 30 minutes prior to radiation 08/30/18   Hayden Pedro, PA-C  mirtazapine  (REMERON) 30 MG tablet Take 1 tablet (30 mg total) by mouth at bedtime. 08/31/18   Curt Bears, MD  nebivolol (BYSTOLIC) 5 MG tablet Take 1 tablet (5 mg total) by mouth daily. 12/29/18   Kayleen Memos, DO  nicotine (NICODERM CQ) 21 mg/24hr patch Place 1 patch (21 mg total) onto the skin daily. 08/31/18   Curt Bears, MD  nitroGLYCERIN (NITROSTAT) 0.4 MG SL tablet Place 1 tablet (0.4 mg total) under the tongue every 5 (five) minutes as needed for chest pain. Patient taking differently: Place 0.4 mg under the tongue every 5 (five) minutes as needed for chest pain. Chest pain 12/24/16   Ahmed Prima, Fransisco Hertz, PA-C  ondansetron (ZOFRAN ODT) 4 MG disintegrating tablet Take 1  tablet (4 mg total) by mouth every 8 (eight) hours as needed for nausea or vomiting. 02/19/19   Tegeler, Gwenyth Allegra, MD  ondansetron (ZOFRAN) 4 MG tablet Take 1 tablet (4 mg total) by mouth daily as needed for nausea or vomiting. 12/28/18 12/28/19  Kayleen Memos, DO  pantoprazole (PROTONIX) 40 MG tablet Take 1 tablet (40 mg total) by mouth daily. 12/19/18   [name], MD  prochlorperazine (COMPAZINE) 10 MG tablet Take 1 tablet (10 mg total) by mouth every 6 (six) hours as needed for nausea or vomiting. 10/23/18   Curt Bears, MD  promethazine (PHENERGAN) 25 MG suppository Place 1 suppository (25 mg total) rectally every 6 (six) hours as needed for nausea or vomiting. 03/04/19   Takeela Peil, PA-C  promethazine (PHENERGAN) 25 MG tablet Take 1 tablet (25 mg total) by mouth every 6 (six) hours as needed for nausea or vomiting. 03/04/19   Shenaya Lebo, PA-C  ranolazine (RANEXA) 1000 MG SR tablet Take 1 tablet (1,000 mg total) by mouth 2 (two) times daily. 05/24/17   Hilty, Nadean Corwin, MD  rosuvastatin (CRESTOR) 20 MG tablet Take 1 tablet (20 mg total) by mouth daily. 12/19/18   [name], MD  senna-docusate (SENOKOT-S) 8.6-50 MG tablet Take 2 tablets by mouth at bedtime. Patient not taking: Reported on 02/19/2019  12/28/18   Kayleen Memos, DO  Sterile Water LIQD Take 30 Bottles by mouth daily. 12/28/18   Kayleen Memos, DO  sucralfate (CARAFATE) 1 g tablet Take 1 tablet by mouth 4 (four) times daily -  before meals and at bedtime. 09/23/18   [provider]  tiotropium (SPIRIVA HANDIHALER) 18 MCG inhalation capsule Place 1 capsule (18 mcg total) into inhaler and inhale daily. 12/19/18   [name], MD  vitamin B-12 (CYANOCOBALAMIN) 500 MCG tablet Take 1 tablet (500 mcg total) by mouth daily. 06/30/18   Jamse Arn, MD  zolpidem (AMBIEN) 5 MG tablet Take 1 tablet (5 mg total) by mouth at bedtime. 02/16/19   Kyung Rudd, MD    Family History Family History  Problem Relation Age of Onset   Heart attack Father    Hypertension Mother    Diabetes Mother    Heart disease Brother        x 3    Heart attack Brother        deceased   Hypertension Sister    Diabetes Sister    Anesthesia problems Neg Hx    Hypotension Neg Hx    Malignant hyperthermia Neg Hx    Pseudochol deficiency Neg Hx     Social History Social History   Tobacco Use   Smoking status: Former Smoker    Packs/day: 0.25    Years: 25.00    Pack years: 6.25    Types: Cigarettes    Last attempt to quit: 05/24/2015    Years since quitting: 3.7   Smokeless tobacco: Never Used  Substance Use Topics   Alcohol use: No    Alcohol/week: 0.0 standard drinks   Drug use: No     Allergies   Iohexol   Review of Systems Review of Systems  Cardiovascular: Positive for chest pain (R lower chest, chronic (since CA)).  Gastrointestinal: Positive for abdominal pain (chronic), nausea and vomiting.  All other systems reviewed and are negative.    Physical Exam Updated Vital Signs BP 119/86    Pulse 82    Temp 98.7 F (37.1 C) (Oral)    Resp 17  Ht _0  (1.753 m)    Wt 81.6 kg    SpO2 94%    BMI 26.58 kg/m   Physical Exam Vitals signs and nursing note reviewed.  Constitutional:      General: He is not  in acute distress.    Appearance: He is well-developed.     Comments: Appears chronically ill  HENT:     Head: Normocephalic and atraumatic.  Eyes:     Conjunctiva/sclera: Conjunctivae normal.     Pupils: Pupils are equal, round, and reactive to light.  Neck:     Musculoskeletal: Normal range of motion and neck supple.  Cardiovascular:     Rate and Rhythm: Normal rate and regular rhythm.     Pulses: Normal pulses.  Pulmonary:     Effort: Pulmonary effort is normal. No respiratory distress.     Breath sounds: Normal breath sounds. No wheezing.     Comments: Speaking full sentences.  Clear lung sounds in all fields. Abdominal:     General: There is no distension.     Palpations: Abdomen is soft. There is no mass.     Tenderness: There is abdominal tenderness. There is no guarding or rebound.     Comments: Palpation of upper abdomen.  PEG tube in place.  No surrounding erythema or edema.  No induration or warmth.  No tenderness palpation of lower abdomen.  No rigidity, guarding, distention.  Negative rebound.  Musculoskeletal: Normal range of motion.  Skin:    General: Skin is warm and dry.     Capillary Refill: Capillary refill takes less than 2 seconds.  Neurological:     Mental Status: He is alert and oriented to person, place, and time.      ED Treatments / Results  Labs (all labs ordered are listed, but only abnormal results are displayed) Labs Reviewed  COMPREHENSIVE METABOLIC PANEL - Abnormal; Notable for the following components:      Result Value   BUN 24 (*)    Creatinine, Ser 1.77 (*)    AST 11 (*)    GFR calc non Af Amer 43 (*)    GFR calc Af Amer 50 (*)    All other components within normal limits  URINALYSIS, ROUTINE W REFLEX MICROSCOPIC - Abnormal; Notable for the following components:   Color, Urine COLORLESS (*)    Specific Gravity, Urine 1.002 (*)    All other components within normal limits  CBC WITH DIFFERENTIAL/PLATELET - Abnormal; Notable for the  following components:   RBC 3.37 (*)    Hemoglobin 9.6 (*)    HCT 31.9 (*)    Lymphs Abs 0.5 (*)    All other components within normal limits  LIPASE, BLOOD  TROPONIN I    EKG EKG Interpretation  Date/Time:  Sunday March 04 2019 17:45:19 EDT Ventricular Rate:  76 PR Interval:    QRS Duration: 101 QT Interval:  396 QTC Calculation: 446 R Axis:   30 Text Interpretation:  Sinus rhythm Left atrial enlargement No significant change was found Confirmed by Jola Schmidt [phone]) on 03/04/2019 6:08:16 PM   Radiology Ct Abdomen Pelvis Wo Contrast  Result Date: 03/04/2019 CLINICAL DATA:  Vomiting for 2 days. Chest pain with deep breaths. Upper abdominal pain, nausea and vomiting. History of lung cancer. EXAM: CT CHEST, ABDOMEN AND PELVIS WITHOUT CONTRAST TECHNIQUE: Multidetector CT imaging of the chest, abdomen and pelvis was performed following the standard protocol without IV contrast. COMPARISON:  Acute abdomen series from earlier same day.  Chest CT dated 12/01/2018. CT abdomen dated 12/01/2018. FINDINGS: CT CHEST FINDINGS Cardiovascular: Aortic atherosclerosis. No thoracic aortic aneurysm. Heart size is normal. Three-vessel coronary artery calcifications. Mediastinum/Nodes: Esophagus is unremarkable. No mass or enlarged lymph nodes seen within the mediastinum or perihilar regions. Suspect stability of the previously described mildly enlarged lymph nodes in the RIGHT paratracheal region and subcarinal region, less well seen today. Trachea is unremarkable. Lungs/Pleura: New small RIGHT pleural effusion. Patchy ill-defined consolidations within the RIGHT lower lobe, most likely atelectasis, pneumonia considered less likely. Previously described nodular consolidations at the RIGHT lung base are not seen but may be obscured by the ill-defined consolidations that are present today. Minimal atelectasis at the LEFT lung base. Biapical emphysematous change and associated scarring/fibrosis. Musculoskeletal:  Mild degenerative spurring within the thoracic spine. No acute or suspicious osseous finding. CT ABDOMEN PELVIS FINDINGS Hepatobiliary: No focal liver abnormality is seen. No gallstones, gallbladder wall thickening, or biliary dilatation. Pancreas: Unremarkable. No pancreatic ductal dilatation or surrounding inflammatory changes. Spleen: Normal in size without focal abnormality. Adrenals/Urinary Tract: Adrenal glands appear normal. Kidneys are unremarkable without suspicious mass, stone or hydronephrosis. Probable small cyst exophytic to the lateral cortex of the RIGHT kidney, too small to definitively characterize. No perinephric fluid. No ureteral or bladder calculi identified. Bladder is unremarkable. Stomach/Bowel: No dilated large or small bowel loops. No evidence of bowel wall inflammation. Appendix is normal. Gastrostomy tube appears appropriately positioned within the gastric body. Stomach appears otherwise unremarkable. Vascular/Lymphatic: Aortic atherosclerosis. No enlarged lymph nodes seen in the abdomen or pelvis. Reproductive: Prostate gland is mildly prominent causing slight mass effect on the bladder base. Other: Trace free fluid in the lower pelvis. No abscess collection seen. No free intraperitoneal air. Musculoskeletal: No acute or suspicious osseous finding. Mild edema/fluid stranding within the subcutaneous soft tissues suggesting anasarca. IMPRESSION: 1. New small right pleural effusion. Patchy ill-defined consolidations within the right lower lobe, most likely atelectasis or aspiration, pneumonia considered less likely. 2. Trace free fluid in the lower pelvis. Abdomen and pelvis are otherwise unremarkable. No bowel obstruction or evidence of bowel wall inflammation. No evidence of acute solid organ abnormality. No renal or ureteral calculi. Appendix is normal. New gastrostomy tube appears appropriately positioned in the stomach. 3. Probable anasarca. 4. Additional chronic/incidental findings  detailed above. Aortic Atherosclerosis (ICD10-I70.0) and Emphysema (ICD10-J43.9). Electronically Signed   By: Franki Cabot M.D.   On: 03/04/2019 20:32   Ct Chest Wo Contrast  Result Date: 03/04/2019 CLINICAL DATA:  Vomiting for 2 days. Chest pain with deep breaths. Upper abdominal pain, nausea and vomiting. History of lung cancer. EXAM: CT CHEST, ABDOMEN AND PELVIS WITHOUT CONTRAST TECHNIQUE: Multidetector CT imaging of the chest, abdomen and pelvis was performed following the standard protocol without IV contrast. COMPARISON:  Acute abdomen series from earlier same day. Chest CT dated 12/01/2018. CT abdomen dated 12/01/2018. FINDINGS: CT CHEST FINDINGS Cardiovascular: Aortic atherosclerosis. No thoracic aortic aneurysm. Heart size is normal. Three-vessel coronary artery calcifications. Mediastinum/Nodes: Esophagus is unremarkable. No mass or enlarged lymph nodes seen within the mediastinum or perihilar regions. Suspect stability of the previously described mildly enlarged lymph nodes in the RIGHT paratracheal region and subcarinal region, less well seen today. Trachea is unremarkable. Lungs/Pleura: New small RIGHT pleural effusion. Patchy ill-defined consolidations within the RIGHT lower lobe, most likely atelectasis, pneumonia considered less likely. Previously described nodular consolidations at the RIGHT lung base are not seen but may be obscured by the ill-defined consolidations that are present today. Minimal atelectasis at the LEFT  lung base. Biapical emphysematous change and associated scarring/fibrosis. Musculoskeletal: Mild degenerative spurring within the thoracic spine. No acute or suspicious osseous finding. CT ABDOMEN PELVIS FINDINGS Hepatobiliary: No focal liver abnormality is seen. No gallstones, gallbladder wall thickening, or biliary dilatation. Pancreas: Unremarkable. No pancreatic ductal dilatation or surrounding inflammatory changes. Spleen: Normal in size without focal abnormality.  Adrenals/Urinary Tract: Adrenal glands appear normal. Kidneys are unremarkable without suspicious mass, stone or hydronephrosis. Probable small cyst exophytic to the lateral cortex of the RIGHT kidney, too small to definitively characterize. No perinephric fluid. No ureteral or bladder calculi identified. Bladder is unremarkable. Stomach/Bowel: No dilated large or small bowel loops. No evidence of bowel wall inflammation. Appendix is normal. Gastrostomy tube appears appropriately positioned within the gastric body. Stomach appears otherwise unremarkable. Vascular/Lymphatic: Aortic atherosclerosis. No enlarged lymph nodes seen in the abdomen or pelvis. Reproductive: Prostate gland is mildly prominent causing slight mass effect on the bladder base. Other: Trace free fluid in the lower pelvis. No abscess collection seen. No free intraperitoneal air. Musculoskeletal: No acute or suspicious osseous finding. Mild edema/fluid stranding within the subcutaneous soft tissues suggesting anasarca. IMPRESSION: 1. New small right pleural effusion. Patchy ill-defined consolidations within the right lower lobe, most likely atelectasis or aspiration, pneumonia considered less likely. 2. Trace free fluid in the lower pelvis. Abdomen and pelvis are otherwise unremarkable. No bowel obstruction or evidence of bowel wall inflammation. No evidence of acute solid organ abnormality. No renal or ureteral calculi. Appendix is normal. New gastrostomy tube appears appropriately positioned in the stomach. 3. Probable anasarca. 4. Additional chronic/incidental findings detailed above. Aortic Atherosclerosis (ICD10-I70.0) and Emphysema (ICD10-J43.9). Electronically Signed   By: Franki Cabot M.D.   On: 03/04/2019 20:32   Dg Abdomen Acute W/chest  Result Date: 03/04/2019 CLINICAL DATA:  Abdominal pain, recent PEG tube placement. Nausea and vomiting for 2 days. EXAM: DG ABDOMEN ACUTE W/ 1V CHEST COMPARISON:  Chest x-rays dated 12/01/2018 and  11/02/2018. FINDINGS: Single-view of the chest: Heart size and mediastinal contours are stable. LEFT chest wall Port-A-Cath is stable in position with tip at the level of the mid SVC. The patchy nodular opacities in the RIGHT lower lobe are stable, corresponding to the peripheral nodular consolidations demonstrated on chest CT of 12/01/2018, likely stable or perhaps slightly improved. No new lung findings. No pleural effusion or pneumothorax seen. Osseous structures about the chest are unremarkable. Supine and upright views of the abdomen: Bowel gas pattern is nonobstructive. No evidence of soft tissue mass or abnormal fluid collection. No evidence of free intraperitoneal air. No evidence of renal or ureteral calculi. Peg tube overlying the LEFT upper abdomen. IMPRESSION: 1. Stable subtle nodular opacities in the RIGHT lower lobe, corresponding to the peripheral nodular consolidations demonstrated on chest CT of 12/01/2018, likely stable or perhaps slightly improved. Would consider chest CT at some point to directly compare with the earlier chest CT. 2. No evidence of a developing pneumonia or pulmonary edema. 3. No acute findings within the abdomen. Nonobstructive bowel gas pattern. Peg tube overlying the LEFT upper quadrant. Electronically Signed   By: Franki Cabot M.D.   On: 03/04/2019 17:55    Procedures Procedures (including critical care time)  Medications Ordered in ED Medications  sodium chloride flush (NS) 0.9 % injection 3 mL (3 mLs Intravenous Given 03/04/19 1720)  sodium chloride 0.9 % bolus 500 mL (0 mLs Intravenous Stopped 03/04/19 1839)  promethazine (PHENERGAN) injection 25 mg (25 mg Intravenous Given 03/04/19 1839)  sodium chloride 0.9 % bolus  1,000 mL (0 mLs Intravenous Stopped 03/04/19 2104)  HYDROmorphone (DILAUDID) injection 1 mg (1 mg Intravenous Given 03/04/19 1839)  HYDROmorphone (DILAUDID) injection 1 mg (1 mg Intravenous Given 03/04/19 2101)  promethazine (PHENERGAN) injection 25  mg (25 mg Intravenous Given 03/04/19 2101)     Initial Impression / Assessment and Plan / ED Course  I have reviewed the triage vital signs and the nursing notes.  Pertinent labs & imaging results that were available during my care of the patient were reviewed by me and considered in my medical decision making (see chart for details).        Presenting for evaluation nausea, vomiting.  Also reporting right lower chest pain and abdominal pain, to consider chronic.  Physical exam is chronically ill-appearing male, but no acute distress.  He does have tenderness palpation of the upper abdomen, but no signs of infection surrounding the PEG tube.  Will order labs, acute abdomen/chest, fluids, and Phenergan for nausea control.  Labs show mild dehydration with an elevated creatinine at 1.77.  Extra 1 L bolus given.  Otherwise, labs reassuring.  No leukocytosis.  Electrolytes stable.  Hemoglobin stable.  X-ray viewed interpreted by me, shows abnormality of the right lower lobe, recommending CT for further evaluation.  No sign of GI obstruction or normal gas pattern.  Case discussed with attending, Dr. Venora Maples evaluated the patient.  Will order CT chest abdomen pelvis for further evaluation.  CT shows small right pleural effusion, less likely pneumonia.  As patient is without cough or fever, doubt pneumonia.  Pleural effusion is likely due to cancer.  He is not hypoxic and without shortness of breath, I do not believe he needs emergent tap today.  Patient states his symptoms are improved, but not resolved.  Will give another round of medication, and discharge with further pain control and nausea control.  Encourage patient to follow-up with his oncologist for further evaluation of the pleural effusion and for further evaluation of his persistent nausea and vomiting.  At this time, patient appears safe discharge.  Return precautions given.  Patient states he understands and agrees to plan.   Final Clinical  Impressions(s) / ED Diagnoses   Final diagnoses:  Non-intractable vomiting with nausea, unspecified vomiting type  Pleural effusion on right    ED Discharge Orders         Ordered    promethazine (PHENERGAN) 25 MG tablet  Every 6 hours PRN     03/04/19 2124    promethazine (PHENERGAN) 25 MG suppository  Every 6 hours PRN     03/04/19 2124    HYDROcodone-acetaminophen (NORCO/VICODIN) 5-325 MG tablet  Every 4 hours PRN     03/04/19 2124           Franchot Heidelberg, PA-C 03/04/19 2128    Jola Schmidt, MD 03/04/19 2131

## 2019-03-04 NOTE — ED Notes (Signed)
Pt has urinal at bedside

## 2019-03-04 NOTE — Discharge Instructions (Addendum)
Continue taking home medications as prescribed. Use either the Phenergan tablet or suppository as needed for nausea or vomiting. Take pain medication as needed. Follow-up with your cancer doctor for further evaluation and management of your symptoms. Start with a clear liquid diet, gradually progressing to a soft diet and then bland foods as your symptoms improve. Return to the emergency room if you develop high fevers, difficulty breathing, persistent vomiting despite medication, or any new, worsening, or concerning symptoms.

## 2019-03-04 NOTE — ED Triage Notes (Addendum)
Patient states he has been vomiting x 2 days. patient states his last treatment was 5 days ago. Patient also reported that his chest hurts when he takes a deep breath and more so when he vomits.

## 2019-03-05 MED FILL — PROMETHAZINE 25 MG TABLET: 25 | 8 days supply | Qty: 30 | Fill #0

## 2019-03-05 MED FILL — HYDROCODON-APAP 5-325: 5-325 | 2 days supply | Qty: 8 | Fill #0

## 2019-03-06 MED FILL — ZOLPIDEM TARTRATE 5 MG TAB: 5 | 30 days supply | Qty: 30 | Fill #2

## 2019-03-09 ENCOUNTER — Encounter: Payer: Self-pay | Admitting: *Deleted

## 2019-03-09 ENCOUNTER — Telehealth: Payer: Self-pay | Admitting: *Deleted

## 2019-03-09 ENCOUNTER — Telehealth: Payer: Self-pay | Admitting: Physician Assistant

## 2019-03-09 NOTE — Telephone Encounter (Signed)
Contacted by staff reviewing Dr. Lysbeth Penner schedule for April 27th during COVID-19 pandemic. Pt reported chest pain during screening process. I was asked to review his eligibility for a telehealth visit.   I reviewed his chart. He has metastatic cancer, receiving radiation, in addition to CAD. He has chronic chest pain at baseline. He stated that his chest pain is stable and he denies any new or different cardiac symptoms. Given his overall clinical picture, he would be high risk for a complicated course should he contract COVID-19. I discussed with him the benefits of being seen via telehealth visit. I let him know that we are trying to keep him safe by conducting evisits. He absolutely refuses telehealth and states he needs to see Dr. Debara Pickett in person and needs paperwork completed for his disability. I told him I would let the staff know.

## 2019-03-09 NOTE — Telephone Encounter (Signed)
Cardiac Questionnaire:    Since your last visit or hospitalization:    1. Have you been having new or worsening chest pain? YES   2. Have you been having new or worsening shortness of breath? NO 3. Have you been having new or worsening leg swelling, wt gain, or increase in abdominal girth (pants fitting more tightly)? YES   4. Have you had any passing out spells? NO    *A YES to any of these questions would result in the appointment being kept. *If all the answers to these questions are NO, we should indicate that given the current situation regarding the worldwide coronarvirus pandemic, at the recommendation of the CDC, we are looking to limit gatherings in our waiting area, and thus will reschedule their appointment beyond four weeks from today.   _____________   COVID-19 Pre-Screening Questions:   Do you currently have a fever? NO  Have you recently travelled on a cruise, internationally, or to [place], [place], [place], [place], [place], or [place], [place] [employer]) ? NO  Have you been in contact with someone that is currently pending confirmation of Covid19 testing or has been confirmed to have the [place] virus? NO  Are you currently experiencing fatigue or cough? NO             Virtual Visit Pre-Appointment Phone Call  Steps For Call:  1. Confirm consent - "In the setting of the current Covid19 crisis, you are scheduled for a (phone or video) visit with your provider on (date) at (time).  Just as we do with many in-office visits, in order for you to participate in this visit, we must obtain consent.  If you'd like, I can send this to your mychart (if signed up) or email for you to review.  Otherwise, I can obtain your verbal consent now.  All virtual visits are billed to your insurance company just like a normal visit would be.  By agreeing to a virtual visit, we'd like you to understand that the technology does not allow for your provider to perform an examination, and thus may limit your  provider's ability to fully assess your condition. If your provider identifies any concerns that need to be evaluated in person, we will make arrangements to do so.  Finally, though the technology is pretty good, we cannot assure that it will always work on either your or our end, and in the setting of a video visit, we may have to convert it to a phone-only visit.  In either situation, we cannot ensure that we have a secure connection.  Are you willing to proceed?" STAFF: Did the patient verbally acknowledge consent to telehealth visit? Document YES/NO here: YES  2. Confirm the BEST phone number to call the day of the visit by including in appointment notes  3. Give patient instructions for WebEx/MyChart download to smartphone as below or Doximity/Doxy.me if video visit (depending on what platform provider is using)  4. Advise patient to be prepared with their blood pressure, heart rate, weight, any heart rhythm information, their current medicines, and a piece of paper and pen handy for any instructions they may receive the day of their visit  5. Inform patient they will receive a phone call 15 minutes prior to their appointment time (may be from unknown caller ID) so they should be prepared to answer  6. Confirm that appointment type is correct in Epic appointment notes (VIDEO vs PHONE)     TELEPHONE CALL NOTE  Andrew West  Wierenga has been deemed a candidate for a follow-up tele-health visit to limit community exposure during the Covid-19 pandemic. I spoke with the patient via phone to ensure availability of phone/video source, confirm preferred email & phone number, and discuss instructions and expectations.  I reminded Andrew West to be prepared with any vital sign and/or heart rhythm information that could potentially be obtained via home monitoring, at the time of his visit. I reminded Andrew West to expect a phone call at the time of his visit if his visit.  Andrew West,  [place] 03/09/2019 2:39 PM   INSTRUCTIONS FOR DOWNLOADING THE WEBEX APP TO SMARTPHONE  - If Apple, ask patient to go to [employer] and type in WebEx in the search bar. [employer], the blue/green circle. If Android, go to [employer] and type in [employer] in the search bar. The app is free but as with any other app downloads, their phone may require them to verify saved payment information or Apple/Android password.  - The patient does NOT have to create an account. - On the day of the visit, the assist will walk the patient through joining the meeting with the meeting number/password.  INSTRUCTIONS FOR DOWNLOADING THE MYCHART APP TO SMARTPHONE  - The patient must first make sure to have activated MyChart and know their login information - If Apple, go to [employer] and type in MyChart in the search bar and download the app. If Android, ask patient to go to [employer] and type in [place] in the search bar and download the app. The app is free but as with any other app downloads, their phone may require them to verify saved payment information or Apple/Android password.  - The patient will need to then log into the app with their MyChart username and password, and select [facility] as their healthcare provider to link the account. When it is time for your visit, go to the MyChart app, find appointments, and click Begin Video Visit. Be sure to Select Allow for your device to access the Microphone and Camera for your visit. You will then be connected, and your provider will be with you shortly.  **If they have any issues connecting, or need assistance please contact MyChart service desk [phone])**  **If using a computer, in order to ensure the best quality for their visit they will need to use either of the following Internet Browsers: [employer], or Google Chrome**  IF USING DOXIMITY or DOXY.ME - The patient will receive a link just prior to their visit,  either by text or email (to be determined day of appointment depending on if it's doxy.me or Doximity).     FULL LENGTH CONSENT FOR TELE-HEALTH VISIT   I hereby voluntarily request, consent and authorize [facility] and its employed or contracted physicians, physician assistants, nurse practitioners or other licensed health care professionals (the Practitioner), to provide me with telemedicine health care services (the Services") as deemed necessary by the treating Practitioner. I acknowledge and consent to receive the Services by the Practitioner via telemedicine. I understand that the telemedicine visit will involve communicating with the Practitioner through live audiovisual communication technology and the disclosure of certain medical information by electronic transmission. I acknowledge that I have been given the opportunity to request an in-person assessment or other available alternative prior to the telemedicine visit and am voluntarily participating in the telemedicine visit.  I understand that I have the right to  withhold or withdraw my consent to the use of telemedicine in the course of my care at any time, without affecting my right to future care or treatment, and that the Practitioner or I may terminate the telemedicine visit at any time. I understand that I have the right to inspect all information obtained and/or recorded in the course of the telemedicine visit and may receive copies of available information for a reasonable fee.  I understand that some of the potential risks of receiving the Services via telemedicine include:   Delay or interruption in medical evaluation due to technological equipment failure or disruption;  Information transmitted may not be sufficient (e.g. poor resolution of images) to allow for appropriate medical decision making by the Practitioner; and/or   In rare instances, security protocols could fail, causing a breach of personal health  information.  Furthermore, I acknowledge that it is my responsibility to provide information about my medical history, conditions and care that is complete and accurate to the best of my ability. I acknowledge that Practitioner's advice, recommendations, and/or decision may be based on factors not within their control, such as incomplete or inaccurate data provided by me or distortions of diagnostic images or specimens that may result from electronic transmissions. I understand that the practice of medicine is not an exact science and that Practitioner makes no warranties or guarantees regarding treatment outcomes. I acknowledge that I will receive a copy of this consent concurrently upon execution via email to the email address I last provided but may also request a printed copy by calling the office of [facility].    I understand that my insurance will be billed for this visit.   I have read or had this consent read to me.  I understand the contents of this consent, which adequately explains the benefits and risks of the Services being provided via telemedicine.   I have been provided ample opportunity to ask questions regarding this consent and the Services and have had my questions answered to my satisfaction.  I give my informed consent for the services to be provided through the use of telemedicine in my medical care  By participating in this telemedicine visit I agree to the above.

## 2019-03-16 ENCOUNTER — Telehealth: Payer: Self-pay | Admitting: Medical Oncology

## 2019-03-16 NOTE — Telephone Encounter (Signed)
Asking to get feeding tube and port removed. l LVM for pt to express this to [facility] at next visit on May 6th.

## 2019-03-19 ENCOUNTER — Telehealth: Payer: Self-pay | Admitting: Internal Medicine

## 2019-03-19 ENCOUNTER — Other Ambulatory Visit: Payer: Self-pay | Admitting: Medical Oncology

## 2019-03-19 ENCOUNTER — Encounter: Payer: Self-pay | Admitting: Internal Medicine

## 2019-03-19 ENCOUNTER — Telehealth: Payer: Self-pay | Admitting: Medical Oncology

## 2019-03-19 ENCOUNTER — Ambulatory Visit (INDEPENDENT_AMBULATORY_CARE_PROVIDER_SITE_OTHER): Payer: Medicare Other | Admitting: Internal Medicine

## 2019-03-19 ENCOUNTER — Other Ambulatory Visit: Payer: Self-pay

## 2019-03-19 VITALS — BP 110/72 | HR 79 | Resp 12 | Ht 69.0 in | Wt 183.3 lb

## 2019-03-19 DIAGNOSIS — I2510 Atherosclerotic heart disease of native coronary artery without angina pectoris: Secondary | ICD-10-CM | POA: Diagnosis not present

## 2019-03-19 DIAGNOSIS — I10 Essential (primary) hypertension: Secondary | ICD-10-CM

## 2019-03-19 DIAGNOSIS — I208 Other forms of angina pectoris: Secondary | ICD-10-CM | POA: Diagnosis not present

## 2019-03-19 DIAGNOSIS — E119 Type 2 diabetes mellitus without complications: Secondary | ICD-10-CM

## 2019-03-19 DIAGNOSIS — C3411 Malignant neoplasm of upper lobe, right bronchus or lung: Secondary | ICD-10-CM

## 2019-03-19 DIAGNOSIS — Z955 Presence of coronary angioplasty implant and graft: Secondary | ICD-10-CM | POA: Diagnosis not present

## 2019-03-19 DIAGNOSIS — C3490 Malignant neoplasm of unspecified part of unspecified bronchus or lung: Secondary | ICD-10-CM | POA: Diagnosis not present

## 2019-03-19 DIAGNOSIS — Z9861 Coronary angioplasty status: Secondary | ICD-10-CM

## 2019-03-19 DIAGNOSIS — I202 Refractory angina pectoris: Secondary | ICD-10-CM

## 2019-03-19 MED ORDER — NEBIVOLOL HCL 5 MG PO TABS: 5.0000 mg | ORAL_TABLET | Freq: Every day | ORAL | 3 refills | Status: DC

## 2019-03-19 MED ORDER — CLOPIDOGREL BISULFATE 75 MG PO TABS: 75.0000 mg | ORAL_TABLET | Freq: Every day | ORAL | 3 refills | Status: DC

## 2019-03-19 MED ORDER — NEBIVOLOL HCL 5 MG PO TABS: 5.0000 mg | ORAL_TABLET | Freq: Every day | ORAL | 3 refills | Status: AC

## 2019-03-19 MED ORDER — NITROGLYCERIN 0.4 MG SL SUBL: 0.4000 mg | SUBLINGUAL_TABLET | SUBLINGUAL | 2 refills | Status: AC | PRN

## 2019-03-19 MED FILL — NITROGLYCERIN 0.4 MG TAB SL: 0.4 | 25 days supply | Qty: 25 | Fill #0

## 2019-03-19 MED FILL — CLOPIDOGREL 75 MG TABLET: 75 | 90 days supply | Qty: 90 | Fill #0

## 2019-03-19 MED FILL — BYSTOLIC 5 MG TABLET: 5 | 90 days supply | Qty: 90 | Fill #0

## 2019-03-19 NOTE — Telephone Encounter (Signed)
Per 4/24 schedule message added lab/port 5/1 for ct expected to be scheduled 5/1. Patient is currently scheduled for 5/4 lab and 5/6 f/u, no ct on schedule. Added port flush to 5/4 lab due today is 4/27 and ct still not on schedule.  Spoke with patient confirmed lab/port and f/u dates and also inquired about ct scan due to message states [facility] is awaiting a call back from patient. Per patient he has already spoken with them regarding scheduling ct 20 minutes ago, however they ([facility]) are telling him that the ct is to be with and without contrast. Per patient he has an allergy to the contrast and should not have it. Per patient he was told they (central) would reach out to Dr. Worthy Flank office and call him back. This message has been routed to provider/desk nurse to address if ct should be with or without contrast. Patient aware.

## 2019-03-19 NOTE — Patient Instructions (Signed)
Medication Instructions:  Your Physician recommend you continue on your current medication as directed.    If you need a refill on your cardiac medications before your next appointment, please call your pharmacy.   Lab work: None If you have labs (blood work) drawn today and your tests are completely normal, you will receive your results only by: Marland Kitchen MyChart Message (if you have MyChart) OR . A paper copy in the mail If you have any lab test that is abnormal or we need to change your treatment, we will call you to review the results.  Testing/Procedures: None  Follow-Up: At [facility], you and your health needs are our priority.  As part of our continuing mission to provide you with exceptional heart care, we have created designated Provider Care Teams.  These Care Teams include your primary Cardiologist (physician) and Advanced Practice Providers (APPs -  Physician Assistants and Nurse Practitioners) who all work together to provide you with the care you need, when you need it. You will need a follow up appointment in 6 months.  Please call our office 2 months in advance to schedule this appointment.  You may see Pixie Casino, MD or one of the following Advanced Practice Providers on your designated Care Team: [place], [place] . Fabian Sharp, PA-C

## 2019-03-19 NOTE — Telephone Encounter (Signed)
Feeding tube -last time he used it 2 weeks ago. Having abdominal  pain for a month and he feels like it is related to the tube.  He wants it removed.

## 2019-03-19 NOTE — Progress Notes (Signed)
OFFICE NOTE  Chief Complaint:  Follow-up, disability evaluation   Primary Care Physician: [name], MD  HPI:  Andrew West  is a 53 year old gentleman with a history of coronary artery disease and stent placement to the circumflex and obtuse marginal and a history of in stent restenosis to the LAD. There is also a nondominant right coronary artery that is 100% occluded, filled with collaterals. He did have a stress test recently, which showed an EF of 53% and reversible septal ischemia in the setting of chest pain and after much convincing underwent cardiac catheterization.  He then underwent cutting balloon angioplasty to an ostial lateral OM1 branch and the bifurcation AV groove circumflex OM junction. This was successful at reducing the stenosis to 0%. This was in November 2012 and he did not return for followup appointment until April 2013. He was suffering from low back pain and has been evaluated and treated by Dr. Trenton Gammon with a laminectomy/discectomy, which he says was not helpful. Otherwise, he continues to smoke and occasionally complains of shortness of breath and some chest pain symptoms which are atypical. He is on long-acting and/or has not needed to take short-acting nitroglycerin. His other concern today is that he is having problems with his teeth and was recommended to have edentulation and by an oral surgeon Dr. Diona Browner. Finally, he is suffering from significant stress and depression due to recent separation with wife in dealing with his kids. This seems to be a big tablets on his continued smoking.  [name] returns today in the office. He feels fairly well. He denies any chest pain. He continues to have problems with low back pain and numbness and tingling in his legs. He is apparently status post laminectomy and microdiscectomy by Dr. Trenton Gammon and continues to have symptoms which may be related to that. He's also complaining of what sounds like neuropathic  pain. He is not currently on medication. He is asking for Tylenol 3 for pain today which I told him I did not prescribe. Ultimately there are no further surgical or nonsurgical options, he may need to go on a neuropathic pain medication or perhaps be referred to a pain management specialist. He is also looking for a new primary care provider as his primary care provider is close to retirement. He reports he has cut back his smoking to about 1 pack every 2 weeks. He is also been out of amlodipine and simvastatin although his blood pressure is well controlled.  I saw [name] in the office today. He is reporting now complains of shortness of breath and chest pain. He also feels like his breathing is worse when working around chemicals at work. He uses a respiratory protection mask but also feels like he is under significant stress. He's had missed several days of work and is concerned about his job. Sounds like he is in a difficult work environment. He says that he is apologizing for "deceiving me" that he was not honest about his symptoms. He apparently has been having shortness of breath and chest pain for several months and did not relay that to me.  [name] returns to the office today for follow-up. His main complaint is shortness of breath. He's not feeling as much chest pain he's had been previously. We recently did a nuclear stress test which was negative for ischemia and showed an EF of 56%. With regards to shortness of breath he's concerned most of this was related to chemicals at work  although he has not worked in several months. He's had about 20 pound weight gain since we last saw him in the office and denies any lower extremity swelling, orthopnea or PND. Shortness of breath is persistent and is associated with some wheezing. He did see Dr. Lake Bells in pulmonary, who felt that he does have COPD with some centrilobular emphysema which was noted on CT scan and he has been placed on inhalers with  minimal benefit subjectively. Shortness of breath seems to be a little bit worse with exertion which makes me wonder whether there is an element of exercise-induced pulmonary hypertension. He's not had an echocardiogram in some time.  [name] returns today for follow-up. He was noted to have some diastolic dysfunction on his echocardiogram. I started him on low-dose Lasix and check lab work including a BNP which is very low around 50. On follow-up today he reports he feels no different with the addition of Lasix. I therefore asked him to take it as needed. He is also describing worsening substernal chest discomfort and a squeezing pressure in his chest today. He says this is been getting worse over the past several days, more than his typical chest discomfort. As previously noted he recently underwent a nuclear stress test a few months ago which was negative for ischemia.   [name] returns for follow-up. As mentioned he had had recent progressive chest pain symptoms and worsening shortness of breath. He underwent another cardiac catheterization which demonstrated the following:   Ost 2nd Mrg to 2nd Mrg lesion, 99% stenosed. Post intervention, 99% residual stenosis remained. The lesion was previously treated with a bare metal stentgreater than two years ago.  Mid Cx-2 lesion, 90% stenosed. Post intervention, there is a 20% residual stenosis.  Mid Cx-1 lesion, 80% stenosed. Post intervention, there is a 50% residual stenosis. The lesion was previously treated with a bare metal stent.  Ramus lesion, 100% stenosed. The lesion was previously treated with a bare metal stentgreater than two years ago.  Prox RCA lesion, 100% stenosed.  There is mild left ventricular systolic dysfunction.  Moderately elevated LVEDP of 20 mmHg   Difficult situation with what amounts to be a totally occluded (In-stent re-stenosis) of the OM2 branch with sub-optimal PTCA of the mid AV-Groove In-stent restenosis.  He continues to have chest pain although reports it somewhat improved. He was started on ranolazine and currently is on 1000 mg twice a day.  Mr. Mancinas returns today for follow-up. He reports he is doing fairly well on medical therapy. He still gets some sharp chest pain mostly along the right sternal border. He does get short of breath with moderate exertion. He's trying to do some walking on a treadmill but generally stops the exercise once he gets short of breath because he is very nervous. He is not currently working due to combined cardiac pulmonary and musculoskeletal diseases. He says that he and his wife are going to go on a delayed honeymoon since they never took one.  04/20/2016  Mr. Capano returns today for follow-up. He was recently seen in the hospital in the beginning of May for chest pain. He reports that it's up in the left neck base around the area of the left clavicle. It's very exquisitely tender to touch and feels sharp and electric. This pain is related we think to cervical pain. He ruled out for MI and was not worked up further for coronary ischemia. This pain is felt to be distinctly different. His isosorbide  was increased up to 60 mg twice a day but he notes no change in his symptoms with that. His primary care providers hesitant to provide any pain medication.  06/28/2016  Mr. Alviar was seen back today in follow-up. He recently was in the ER at [facility] the left without being seen after 3 hours. An EKG was performed and showed no ischemic changes. He reported going home and taking some nitroglycerin and aspirin with some eventual improvement in his symptoms. He continues to have a sharp left chest discomfort which is tender to the touch and is persistent. It's not necessarily worse with exertion or relieved by rest. I felt this may likely be a neuropathic pain and I recommended starting gabapentin 300 mg daily at bedtime. He reports no improvement with this medication. I advised him  to discontinue today. He's also concerned about poor sleep at night, significant preoccupation with death and anxiety. This is concerning for possible PTSD type symptoms.  08/23/2016  Mr. Fugitt returns today for follow-up. I referred him to behavioral health and he says that he went but it was not helpful. The medical record however does not show any evidence that he made that appointment. He did present to the emergency department on 06/29/2016 with depression and anxiety and was felt not to need inpatient treatment. He underwent nuclear stress testing which was negative for ischemia and showed normal LV function. I do not believe is ongoing chest pain at this time is due to ischemia. Blood pressure is elevated today however 142/102. A recheck was 138/89.  11/03/2016  I saw Mr. Turnbough today for follow-up again. He is complaining of persistent chest pain which is chronic, tends to be present for most of the day at rest and with exertion. He also complains of shortness of breath. Surprisingly blood pressure is elevated today, much higher than it has been in the past at 150/105. He reports compliance with his medications. This could be contributing to his problems. He is interested in another heart catheterization. I did remind him that we performed a stress test only 3 months ago which was negative for ischemia. His last coronary intervention was about a year ago which she had some percutaneous angioplasty but no new stent placement. He has complex anatomy that was not amenable to PCI, rather medical therapy was recommended however he has been on maximal doses of nitrates, Ranexa, beta blocker and other blood pressure medications. Interestingly, despite a high dose of metoprolol, his heart rate remains up in the 80s. EKG today is nonischemic.  12/02/2016  Mr. Sausedo returns today for follow-up. He reports despite changing his medications which has resulted in improvement in blood pressure that he still  has significant chest discomfort and shortness of breath. He is certain that there is a new blockage in his heart that the cause of this. He believes he needs another heart catheterization. Symptoms seem similar to his symptoms prior to his last cutting balloon angioplasty more than 2 years ago.  01/17/2017  Mr. Ferran seen today in follow-up. He underwent recent repeat coronary catheterization which showed no clear targets for intervention. He did have some small distal disease which was angioplastied and noted a small improvement in his symptoms. His nitroglycerin was increased which I think is also contributing to his improvement. At this point I think we need to continue with medical therapy. We could also possibly consider adding some pain medication, perhaps a neuropathic pain medicine is her may be a complex regional pain  syndrome or other component of recurrent chest pain beyond ischemia.  03/24/2017  Mr. Saathoff seen today in follow-up. He continues to have pain in the chest. I don't believe it's anginal. I suspect he might have a complex regional pain syndrome. He is taken gabapentin in the past without much success but does not take it regularly. He may benefit from stronger neuropathic pain medicine such as pregabalin. I discussed with the pharmacist today and we ran an interaction check with his current medications. Potential interaction his Prozac with a slight increased risk of serotonin syndrome. I advised him to monitor that and look for symptoms such as fever or muscle rigidity.  05/24/2017  Mr. Perlow returns today for hospital follow-up. He was again in the hospital for chest pain. Cardiac enzymes were negative. He was seen by one of my partners and recommended follow-up with me. Mr. Kamel reports recurrent chest pain which seems to be nitrate responsive, despite being on high-dose indoor 90 mg every morning and 60 mg every afternoon, pregabalin, Ranexa thousand milligrams twice a day,  high-dose amlodipine, beta blocker, and other treatments for angina, with very well controlled LDL-C less than 60. He is currently on maximal medical therapy. In January he had PCI to the circumflex for in-stent restenosis with a suboptimal result. I reviewed his coronary anatomy today and cath films personally with Dr. Peter Martinique, to see if he was a candidate for chronic total occlusion PCI. His feeling is that there are no real percutaneous options for him. He had at least a diffuse area 50% stenosis of the circumflex with a small distal vessel, close to 50% mid to distal LAD disease and an ostial/proximally occluded RCA with very faint left to right collaterals. Based on these findings, surgical opinion was suggested - although targets are arguably small.   07/12/2017  Mr. Paredez was seen today in follow-up. He recently was seen in the emergency department for tooth and jaw pain. He has significant dental caries and will need global extraction. I've ready provided cardiac clearance for this which apparently can be done on aspirin and Plavix. He's also had back pain which has been chronic. I prefer him to see Dr. Lucianne Lei tried for surgical consultation however it is felt that he's not a candidate for bypass at this time unless he were to develop LAD disease. He has small vessels which are poor targets for grafting. Medical therapy is recommended.  10/17/2017  Mr. Selleck returns today for follow-up.  He reports recurrent chest pain.  He said "I think it is time to go back to the Cath Lab again".  He is already on maximal medical therapy including long-acting nitrates, calcium channel blocker, beta-blocker and ranolazine.  During his last heart catheterization, there were no additional targets for revascularization.  He is not considered a good PCI candidate going forward.  He was referred to CT surgery for possible CABG evaluation and determined to have small vessels which were ungraftable.  We have tried  management of his pain symptoms with neuropathic medications for possible CPRS of the chest without much benefit.  He is recently been having some abdominal pain and a CT scan of the abdomen was ordered.  Because of a possible contrast reaction in the past, he is CT was canceled.  They suggested he will need premedication.  His symptoms seem to be much worse at exertion and relieved by rest.  02/27/2018  Mr. Mier was seen today in follow-up.  He was recently seen in  the hospital for recurrent chest pain.  He ruled out for MI.  His pain seemed to be improved somewhat with tramadol.  It was felt that no further testing was necessary and he was discharged.  Today he reports his chest pain again is on and off.  Not necessarily associated with exertion or relieved by rest.  There is certainly a chest wall pain which may be complex regional pain syndrome, but he has experienced some exertional pain and shortness of breath.  Some of this could be angina.  I previously referred him for evaluation at [facility], since they are the only program in the area to have EECP.  It is felt that this might be helpful for chronic angina.  He says that he never received a call from their office to schedule an appointment.  This apparently is performed by Dr. Purcell Nails.  03/19/2019  Mr. Fanguy was seen in the office today for disability exam and evaluation. He reports constant chest pain which is well-documented and is unchanged from prior exams. Unfortunately, he continues to battle metastatic lung cancer which has spread to the brain. He has lost >60 lbs since I saw him 1 year ago and is noted to have a PEG tube in place. He has little to no appetite. He was disabled from work due to chronic chest pain and has a history of multiple PCI's with little additional interventional options. His disability is considered permanent.  PMHx:  Past Medical History:  Diagnosis Date   Anxiety    CAD (coronary artery disease)    a. s/p  multiple PCIs with last cath 11/2016 with severe multivessel CAD, s/p PCTA to LCx but unable to pass stent   Cancer (HCC)    Chronic leg pain    bilateral   Chronic lower back pain    COPD (chronic obstructive pulmonary disease) ([place])    Depression    GERD (gastroesophageal reflux disease)    Takes Dexilant   HLD (hyperlipidemia)    Hypertension    Rhabdomyolysis    h/o, r/t statins   Sleep apnea    "can't tolerate mask" (12/16/2016)   Type II diabetes mellitus ([place])     Past Surgical History:  Procedure Laterality Date   BACK SURGERY     BRONCHIAL BIOPSY  08/25/2018   Procedure: BRONCHIAL BIOPSIES;  Surgeon: Garner Nash, DO;  Location: [facility];  Service: Cardiopulmonary;;   CARDIAC CATHETERIZATION N/A 09/25/2015   Procedure: Left Heart Cath and Coronary Angiography;  Surgeon: Leonie Man, MD;  Location: [place] [facility];  Service: Cardiovascular;  Laterality: N/A;   CARDIAC CATHETERIZATION N/A 12/16/2016   Procedure: Left Heart Cath and Coronary Angiography;  Surgeon: Leonie Man, MD;  Location: [place] [facility];  Service: Cardiovascular;  Laterality: N/A;   CARDIAC CATHETERIZATION N/A 12/16/2016   Procedure: Coronary Balloon Angioplasty;  Surgeon: Leonie Man, MD;  Location: [place] [facility];  Service: Cardiovascular;  Laterality: N/A;   COLONOSCOPY W/ POLYPECTOMY     CORONARY ANGIOPLASTY  09/25/2015   mid cir & om   CORONARY ANGIOPLASTY WITH STENT PLACEMENT  10/09/2001   PTCA & stenting of mid AV circumflex; 2.5x40m Pixel stent   CORONARY ANGIOPLASTY WITH STENT PLACEMENT  12/13/2001   PCI with stent to mid L circumflex, 95% stenosis to 0% residual   CORONARY ANGIOPLASTY WITH STENT PLACEMENT  10/10/2003   PCI to mid AV circumflex; LAD 30% disease; RCA 100% occluded prox.   CORONARY ANGIOPLASTY WITH  STENT PLACEMENT  09/01/2011   PCI with stenting with bare metal stent to mid AV groove circumflex and PDA   CORONARY ANGIOPLASTY WITH  STENT PLACEMENT  10/17/2011   cutting balloon angioplasty of ostial lateral OM1 branch and bifurcation AV groove circumflex OM junction; stenosis reduced to 0%   ENDOBRONCHIAL ULTRASOUND Bilateral 08/25/2018   Procedure: ENDOBRONCHIAL ULTRASOUND;  Surgeon: Garner Nash, DO;  Location: [facility];  Service: Cardiopulmonary;  Laterality: Bilateral;   ESOPHAGOGASTRODUODENOSCOPY (EGD) WITH PROPOFOL N/A 12/08/2018   Procedure: ESOPHAGOGASTRODUODENOSCOPY (EGD) WITH PROPOFOL;  Surgeon: Milus Banister, MD;  Location: [facility];  Service: Endoscopy;  Laterality: N/A;   EXCISIONAL HEMORRHOIDECTOMY     FINE NEEDLE ASPIRATION  08/25/2018   Procedure: FINE NEEDLE ASPIRATION;  Surgeon: Garner Nash, DO;  Location: [facility];  Service: Cardiopulmonary;;   FLEXIBLE BRONCHOSCOPY  08/25/2018   Procedure: FLEXIBLE BRONCHOSCOPY;  Surgeon: Garner Nash, DO;  Location: [facility];  Service: Cardiopulmonary;;   IR GASTROSTOMY TUBE MOD SED  12/11/2018   IR IMAGING GUIDED PORT INSERTION  09/29/2018   LEFT HEART CATHETERIZATION WITH CORONARY ANGIOGRAM N/A 10/18/2011   Procedure: LEFT HEART CATHETERIZATION WITH CORONARY ANGIOGRAM;  Surgeon: Leonie Man, MD;  Location: [facility] CATH LAB;  Service: Cardiovascular;  Laterality: N/A;   LUMBAR LAMINECTOMY/DECOMPRESSION MICRODISCECTOMY  03/31/2012   Procedure: LUMBAR LAMINECTOMY/DECOMPRESSION MICRODISCECTOMY 1 LEVEL;  Surgeon: Charlie Pitter, MD;  Location: [place] [facility];  Service: Neurosurgery;  Laterality: Left;   TRANSTHORACIC ECHOCARDIOGRAM  07/28/2011   EF 55-65%; LVH, grade 1 diastolic dysfunction;     FAMHx:  Family History  Problem Relation Age of Onset   Heart attack Father    Hypertension Mother    Diabetes Mother    Heart disease Brother        x 3    Heart attack Brother        deceased   Hypertension Sister    Diabetes Sister    Anesthesia problems Neg Hx    Hypotension Neg Hx    Malignant hyperthermia Neg Hx     Pseudochol deficiency Neg Hx     SOCHx:   reports that he quit smoking about 3 years ago. His smoking use included cigarettes. He has a 6.25 pack-year smoking history. He has never used smokeless tobacco. He reports that he does not drink alcohol or use drugs.  ALLERGIES:  Allergies  Allergen Reactions   Iohexol Anaphylaxis    PT. TO BE PREMEDICATED PRIOR TO IV CONTRAST PER DR Kris Hartmann /MMS//12/15/15Desc: PT BECAME SOB AND CHEST TIGHTNESS AFTER CONTRAST INJECTION.  [name],RT-RCT., Onset Date: [id number]     ROS: Pertinent items noted in HPI and remainder of comprehensive ROS otherwise negative.  HOME MEDS: Current Outpatient Medications  Medication Sig Dispense Refill   ACCU-CHEK FASTCLIX LANCETS MISC Use as directed 102 each 12   albuterol (VENTOLIN HFA) 108 (90 Base) MCG/ACT inhaler Inhale 1 puff into the lungs every 6 (six) hours as needed for wheezing or shortness of breath. 18 g 2   aspirin 81 MG tablet Take 1 tablet (81 mg total) daily by mouth. 30 tablet    Cholecalciferol (VITAMIN D) 2000 units tablet Take 1 tablet (2,000 Units total) by mouth daily. 30 tablet 1   clopidogrel (PLAVIX) 75 MG tablet Take 1 tablet (75 mg total) by mouth daily. YOU MAY RESTART 08/26/2018 90 tablet 3   diclofenac sodium (VOLTAREN) 1 % GEL Apply 2 g topically 4 (four) times daily. 1 Tube 0  dronabinol (MARINOL) 5 MG capsule Take 1 capsule (5 mg total) by mouth 2 (two) times daily before a meal. 60 capsule 0   FLUoxetine (PROZAC) 40 MG capsule Take 1 capsule (40 mg total) by mouth daily. 90 capsule 1   Fluticasone-Salmeterol (ADVAIR) 100-50 MCG/DOSE AEPB Inhale 1 puff into the lungs 2 (two) times daily. 1 each 3   glucose blood (ACCU-CHEK GUIDE) test strip Use as instructed 100 each 12   HYDROcodone-acetaminophen (NORCO/VICODIN) 5-325 MG tablet Take 1 tablet by mouth every 4 (four) hours as needed. 8 tablet 0   LORazepam (ATIVAN) 0.5 MG tablet 1 tab po q 4-6 hours prn or 1 tab po 30  minutes prior to radiation 30 tablet 0   mirtazapine (REMERON) 30 MG tablet Take 1 tablet (30 mg total) by mouth at bedtime. 30 tablet 2   nebivolol (BYSTOLIC) 5 MG tablet Take 1 tablet (5 mg total) by mouth daily. 90 tablet 3   nitroGLYCERIN (NITROSTAT) 0.4 MG SL tablet Place 1 tablet (0.4 mg total) under the tongue every 5 (five) minutes as needed for chest pain. 25 tablet 2   ondansetron (ZOFRAN ODT) 4 MG disintegrating tablet Take 1 tablet (4 mg total) by mouth every 8 (eight) hours as needed for nausea or vomiting. 20 tablet 0   pantoprazole (PROTONIX) 40 MG tablet Take 1 tablet (40 mg total) by mouth daily. 90 tablet 1   prochlorperazine (COMPAZINE) 10 MG tablet Take 1 tablet (10 mg total) by mouth every 6 (six) hours as needed for nausea or vomiting. 30 tablet 0   promethazine (PHENERGAN) 25 MG suppository Place 1 suppository (25 mg total) rectally every 6 (six) hours as needed for nausea or vomiting. 12 each 0   ranolazine (RANEXA) 1000 MG SR tablet Take 1 tablet (1,000 mg total) by mouth 2 (two) times daily. 180 tablet 3   rosuvastatin (CRESTOR) 20 MG tablet Take 1 tablet (20 mg total) by mouth daily. 90 tablet 1   Sterile Water LIQD Take 30 Bottles by mouth daily. 30 Bottle 0   sucralfate (CARAFATE) 1 g tablet Take 1 tablet by mouth 4 (four) times daily -  before meals and at bedtime.  0   tiotropium (SPIRIVA HANDIHALER) 18 MCG inhalation capsule Place 1 capsule (18 mcg total) into inhaler and inhale daily. 90 capsule 1   vitamin B-12 (CYANOCOBALAMIN) 500 MCG tablet Take 1 tablet (500 mcg total) by mouth daily. 30 tablet 1   zolpidem (AMBIEN) 5 MG tablet Take 1 tablet (5 mg total) by mouth at bedtime. 30 tablet 0   No current facility-administered medications for this visit.     LABS/IMAGING: No results found for this or any previous visit (from the past 48 hour(s)). No results found.  VITALS: BP 110/72    Pulse 79    Resp 12    Ht _0  (1.753 m)    Wt 183 lb 4.8 oz  (83.1 kg)    SpO2 98%    BMI 27.07 kg/m   EXAM: General appearance: alert and no distress Neck: no carotid bruit, no JVD and thyroid not enlarged, symmetric, no tenderness/mass/nodules Lungs: diminished breath sounds bilaterally and chemo port in the left upper chest Heart: regular rate and rhythm, S1, S2 normal, no murmur, click, rub or gallop Abdomen: soft, non-tender; bowel sounds normal; no masses,  no organomegaly Extremities: extremities normal, atraumatic, no cyanosis or edema Pulses: 2+ and symmetric Skin: Skin color, texture, turgor normal. No rashes or lesions Neurologic: Grossly normal  Psych: Appears depressed  EKG: Deferred  ASSESSMENT: 1. Permanently disabled due to chronic angina 2. Metastatic lung cancer to the brain 3. Recurrent chest pain and dyspnea at rest and with exertion - Occluded OM2 at a prior stent - no good revascularization options - low risk Myoview with no ischemia (07/2016) 4. Coronary artery disease status post PCI and cutting balloon angioplasty (11/2016) 5. Ongoing tobacco abuse 6. Uncontrolled hypertension 7. Dyslipidemia 8. Neuropathy - ?CPRS of the chest 9. Persistent low back pain 10. Diabetes type 2 11. Morbid obesity 12. Anxiety/?PTSD 13. Acceptable risk for dental extraction  PLAN: 1.   Mr. Bastos has chronic angina which has been disabling. Unfortunately, he was diagnosed with lung cancer which is metastatic last year. This has been an awful year for him. His brother died last year as well. He seems to be tolerating treatments for cancer, but has lost a significant amount of weight and has no appetite. He uses a PEG tube to feed himself. His angina appears stable. I would consider him permanently disabled. Will fill out paperwork to that extent today. Refill Plavix, nitro and Bystolic today.   Follow-up in 6 months.  Pixie Casino, MD, [facility], [place] Director of the [facility] &    [facility] Attending Cardiologist  Direct Dial: [phone]   Fax: [phone]  Website:  [email or web address] Kieren Adkison 03/19/2019, 10:20 AM

## 2019-03-19 NOTE — Telephone Encounter (Signed)
Pt notified that he does not need a scan

## 2019-03-19 NOTE — Telephone Encounter (Signed)
Feeding Tube-Per Mohamed pt may get feeding tube removed. Order entered. LVM for pt to expect a call from radiology scheduling.

## 2019-03-20 ENCOUNTER — Encounter: Payer: Self-pay | Admitting: Family Medicine

## 2019-03-20 ENCOUNTER — Ambulatory Visit: Payer: Medicare Other | Attending: Family Medicine | Admitting: Family Medicine

## 2019-03-20 ENCOUNTER — Other Ambulatory Visit: Payer: Self-pay

## 2019-03-20 VITALS — BP 109/74 | HR 75 | Temp 98.3°F | Ht 69.0 in | Wt 181.2 lb

## 2019-03-20 DIAGNOSIS — E0800 Diabetes mellitus due to underlying condition with hyperosmolarity without nonketotic hyperglycemic-hyperosmolar coma (NKHHC): Secondary | ICD-10-CM | POA: Diagnosis not present

## 2019-03-20 DIAGNOSIS — F329 Major depressive disorder, single episode, unspecified: Secondary | ICD-10-CM | POA: Diagnosis not present

## 2019-03-20 DIAGNOSIS — K219 Gastro-esophageal reflux disease without esophagitis: Secondary | ICD-10-CM | POA: Diagnosis not present

## 2019-03-20 DIAGNOSIS — R29898 Other symptoms and signs involving the musculoskeletal system: Secondary | ICD-10-CM

## 2019-03-20 DIAGNOSIS — I208 Other forms of angina pectoris: Secondary | ICD-10-CM | POA: Diagnosis not present

## 2019-03-20 DIAGNOSIS — G4709 Other insomnia: Secondary | ICD-10-CM | POA: Diagnosis not present

## 2019-03-20 DIAGNOSIS — F419 Anxiety disorder, unspecified: Secondary | ICD-10-CM | POA: Diagnosis not present

## 2019-03-20 DIAGNOSIS — C3411 Malignant neoplasm of upper lobe, right bronchus or lung: Secondary | ICD-10-CM

## 2019-03-20 DIAGNOSIS — J449 Chronic obstructive pulmonary disease, unspecified: Secondary | ICD-10-CM | POA: Diagnosis not present

## 2019-03-20 DIAGNOSIS — F32A Depression, unspecified: Secondary | ICD-10-CM

## 2019-03-20 LAB — GLUCOSE, POCT (MANUAL RESULT ENTRY): POC Glucose: 90 mg/dl (ref 70–99)

## 2019-03-20 LAB — POCT GLYCOSYLATED HEMOGLOBIN (HGB A1C): HbA1c, POC (controlled diabetic range): 5 % (ref 0.0–7.0)

## 2019-03-20 MED ORDER — PANTOPRAZOLE SODIUM 40 MG PO TBEC: 40.0000 mg | DELAYED_RELEASE_TABLET | Freq: Every day | ORAL | 1 refills | Status: DC

## 2019-03-20 MED ORDER — ALBUTEROL SULFATE HFA 108 (90 BASE) MCG/ACT IN AERS: 1.0000 | INHALATION_SPRAY | Freq: Four times a day (QID) | RESPIRATORY_TRACT | 1 refills | Status: DC | PRN

## 2019-03-20 MED ORDER — FLUTICASONE-SALMETEROL 100-50 MCG/DOSE IN AEPB: 1.0000 | INHALATION_SPRAY | Freq: Two times a day (BID) | RESPIRATORY_TRACT | 1 refills | Status: DC

## 2019-03-20 MED ORDER — DULOXETINE HCL 60 MG PO CPEP: 60.0000 mg | ORAL_CAPSULE | Freq: Every day | ORAL | 1 refills | Status: DC

## 2019-03-20 MED ORDER — ROSUVASTATIN CALCIUM 20 MG PO TABS: 20.0000 mg | ORAL_TABLET | Freq: Every day | ORAL | 1 refills | Status: AC

## 2019-03-20 MED ORDER — TIOTROPIUM BROMIDE MONOHYDRATE 18 MCG IN CAPS: 18.0000 ug | ORAL_CAPSULE | Freq: Every day | RESPIRATORY_TRACT | 1 refills | Status: DC

## 2019-03-20 MED ORDER — ZOLPIDEM TARTRATE 5 MG PO TABS: 5.0000 mg | ORAL_TABLET | Freq: Every day | ORAL | 3 refills | Status: DC

## 2019-03-20 MED FILL — ADVAIR 100/50 DISKUS: 100-50 | 30 days supply | Qty: 60 | Fill #0

## 2019-03-20 MED FILL — VENTOLIN HFA 90 MCG INHALER: 108 (90 BAS | 50 days supply | Qty: 18 | Fill #0

## 2019-03-20 MED FILL — DULOXETINE HCL 60 MG CPEP: 60 | 90 days supply | Qty: 90 | Fill #0

## 2019-03-20 MED FILL — PANTOPRAZOLE SOD DR 40 MG T: 40 | 90 days supply | Qty: 90 | Fill #0

## 2019-03-20 MED FILL — SPIRIVA 18 MCG CP-HANDIHALE: 18 | 30 days supply | Qty: 30 | Fill #0

## 2019-03-20 MED FILL — ROSUVASTATIN CALCIUM 20 MG: 20 | 90 days supply | Qty: 90 | Fill #0

## 2019-03-20 NOTE — Progress Notes (Signed)
Subjective:  Patient ID: Andrew West, male    DOB: [birth year]  Age: 53 y.o. MRN: [id number]  CC: Diabetes   HPI Andrew West Andrew West  is a 53 y.o. 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  His appetite has significantly reduced with significant weight loss of greater than 60 pounds.  He feeds himself through PEG tube.  He has had several ED visits for nausea and vomiting.  Last seen by Oncology Dr Shirline Frees in 12/25/18 and was referred to Dr Mitzi Hansen for prophylactic cranial radiation and he is currently undergoing radiation.(MRI brain from 08/2018 and PET scan from 07/2018 had revealed R cervical lymph nodes suspicious for metastatic disease).  MRI brain from 01/25/2019 revealed no intracranial metastatic disease identified, stable mild microvascular ischemic changes and volume loss of the brain. Today he complains of weakness in both lower extremities to the point where he has fallen the last of which was yesterday.  He felt his leg give out on him.  He endorses presence of pins-and-needles all over.  On discussing gabapentin he states he did not do well on it in the past and would not like to be on it again.  He had a visit to his cardiologist Dr. Rennis Golden yesterday for a disability exam.  He remains on his chronic cardiac medications for management of chronic angina.  Past Medical History:  Diagnosis Date  . Anxiety   . CAD (coronary artery disease)    a. s/p multiple PCIs with last cath 11/2016 with severe multivessel CAD, s/p PCTA to LCx but unable to pass stent  . Cancer (HCC)   . Chronic leg pain    bilateral  . Chronic lower  back pain   . COPD (chronic obstructive pulmonary disease) (HCC)   . Depression   . GERD (gastroesophageal reflux disease)    Takes Dexilant  . HLD (hyperlipidemia)   . Hypertension   . Rhabdomyolysis    h/o, r/t statins  . Sleep apnea    "can't tolerate mask" (12/16/2016)  . Type II diabetes mellitus (HCC)     Past Surgical History:  Procedure Laterality Date  . BACK SURGERY    . BRONCHIAL BIOPSY  08/25/2018   Procedure: BRONCHIAL BIOPSIES;  Surgeon: Josephine Igo, DO;  Location: [facility];  Service: Cardiopulmonary;;  . CARDIAC CATHETERIZATION N/A 09/25/2015   Procedure: Left Heart Cath and Coronary Angiography;  Surgeon: Marykay Lex, MD;  Location: [facility] INVASIVE CV LAB;  Service: Cardiovascular;  Laterality: N/A;  . CARDIAC CATHETERIZATION N/A 12/16/2016   Procedure: Left Heart Cath and Coronary Angiography;  Surgeon: Marykay Lex, MD;  Location: [facility] INVASIVE CV LAB;  Service: Cardiovascular;  Laterality: N/A;  . CARDIAC CATHETERIZATION N/A 12/16/2016   Procedure: Coronary Balloon Angioplasty;  Surgeon: Marykay Lex, MD;  Location[facility] INVASIVE CV LAB;  Service: Cardiovascular;  Laterality: N/A;  . COLONOSCOPY W/ POLYPECTOMY    . CORONARY ANGIOPLASTY  09/25/2015   mid cir & om  . CORONARY ANGIOPLASTY WITH STENT PLACEMENT  10/09/2001   PTCA & stenting of mid AV circumflex; 2.5x59mm Pixel stent  . CORONARY ANGIOPLASTY  WITH STENT PLACEMENT  12/13/2001   PCI with stent to mid L circumflex, 95% stenosis to 0% residual  . CORONARY ANGIOPLASTY WITH STENT PLACEMENT  10/10/2003   PCI to mid AV circumflex; LAD 30% disease; RCA 100% occluded prox.  . CORONARY ANGIOPLASTY WITH STENT PLACEMENT  09/01/2011   PCI with stenting with bare metal stent to mid AV groove circumflex and PDA  . CORONARY ANGIOPLASTY WITH STENT PLACEMENT  10/17/2011   cutting balloon angioplasty of ostial lateral OM1 branch and bifurcation AV groove circumflex OM junction; stenosis reduced to 0%  .  ENDOBRONCHIAL ULTRASOUND Bilateral 08/25/2018   Procedure: ENDOBRONCHIAL ULTRASOUND;  Surgeon: Josephine Igo, DO;  Location: [facility];  Service: Cardiopulmonary;  Laterality: Bilateral;  . ESOPHAGOGASTRODUODENOSCOPY (EGD) WITH PROPOFOL N/A 12/08/2018   Procedure: ESOPHAGOGASTRODUODENOSCOPY (EGD) WITH PROPOFOL;  Surgeon: Rachael Fee, MD;  Location: [facility];  Service: Endoscopy;  Laterality: N/A;  . EXCISIONAL HEMORRHOIDECTOMY    . FINE NEEDLE ASPIRATION  08/25/2018   Procedure: FINE NEEDLE ASPIRATION;  Surgeon: Josephine Igo, DO;  Location: [facility];  Service: Cardiopulmonary;;  . FLEXIBLE BRONCHOSCOPY  08/25/2018   Procedure: FLEXIBLE BRONCHOSCOPY;  Surgeon: Josephine Igo, DO;  Location: [facility];  Service: Cardiopulmonary;;  . IR GASTROSTOMY TUBE MOD SED  12/11/2018  . IR IMAGING GUIDED PORT INSERTION  09/29/2018  . LEFT HEART CATHETERIZATION WITH CORONARY ANGIOGRAM N/A 10/18/2011   Procedure: LEFT HEART CATHETERIZATION WITH CORONARY ANGIOGRAM;  Surgeon: Marykay Lex, MD;  Location: [facility] CATH LAB;  Service: Cardiovascular;  Laterality: N/A;  . LUMBAR LAMINECTOMY/DECOMPRESSION MICRODISCECTOMY  03/31/2012   Procedure: LUMBAR LAMINECTOMY/DECOMPRESSION MICRODISCECTOMY 1 LEVEL;  Surgeon: Temple Pacini, MD;  Location: [facility] NEURO ORS;  Service: Neurosurgery;  Laterality: Left;  . TRANSTHORACIC ECHOCARDIOGRAM  07/28/2011   EF 55-65%; LVH, grade 1 diastolic dysfunction;     Family History  Problem Relation Age of Onset  . Heart attack Father   . Hypertension Mother   . Diabetes Mother   . Heart disease Brother        x 3   . Heart attack Brother        deceased  . Hypertension Sister   . Diabetes Sister   . Anesthesia problems Neg Hx   . Hypotension Neg Hx   . Malignant hyperthermia Neg Hx   . Pseudochol deficiency Neg Hx     Allergies  Allergen Reactions  . Iohexol Anaphylaxis    PT. TO BE PREMEDICATED PRIOR TO IV CONTRAST PER DR Eppie Gibson /MMS//12/15/15Desc: PT BECAME  SOB AND CHEST TIGHTNESS AFTER CONTRAST INJECTION.  [name],RT-RCT., Onset Date: [id number]     Outpatient Medications Prior to Visit  Medication Sig Dispense Refill  . ACCU-CHEK FASTCLIX LANCETS MISC Use as directed 102 each 12  . aspirin 81 MG tablet Take 1 tablet (81 mg total) daily by mouth. 30 tablet   . Cholecalciferol (VITAMIN D) 2000 units tablet Take 1 tablet (2,000 Units total) by mouth daily. 30 tablet 1  . clopidogrel (PLAVIX) 75 MG tablet Take 1 tablet (75 mg total) by mouth daily. YOU MAY RESTART 08/26/2018 90 tablet 3  . diclofenac sodium (VOLTAREN) 1 % GEL Apply 2 g topically 4 (four) times daily. 1 Tube 0  . dronabinol (MARINOL) 5 MG capsule Take 1 capsule (5 mg total) by mouth 2 (two) times daily before a meal. 60 capsule 0  . glucose blood (ACCU-CHEK GUIDE) test strip Use as instructed 100 each 12  . HYDROcodone-acetaminophen (NORCO/VICODIN) 5-325 MG tablet  Take 1 tablet by mouth every 4 (four) hours as needed. 8 tablet 0  . LORazepam (ATIVAN) 0.5 MG tablet 1 tab po q 4-6 hours prn or 1 tab po 30 minutes prior to radiation 30 tablet 0  . mirtazapine (REMERON) 30 MG tablet Take 1 tablet (30 mg total) by mouth at bedtime. 30 tablet 2  . nebivolol (BYSTOLIC) 5 MG tablet Take 1 tablet (5 mg total) by mouth daily. 90 tablet 3  . nitroGLYCERIN (NITROSTAT) 0.4 MG SL tablet Place 1 tablet (0.4 mg total) under the tongue every 5 (five) minutes as needed for chest pain. 25 tablet 2  . ondansetron (ZOFRAN ODT) 4 MG disintegrating tablet Take 1 tablet (4 mg total) by mouth every 8 (eight) hours as needed for nausea or vomiting. 20 tablet 0  . prochlorperazine (COMPAZINE) 10 MG tablet Take 1 tablet (10 mg total) by mouth every 6 (six) hours as needed for nausea or vomiting. 30 tablet 0  . ranolazine (RANEXA) 1000 MG SR tablet Take 1 tablet (1,000 mg total) by mouth 2 (two) times daily. 180 tablet 3  . Sterile Water LIQD Take 30 Bottles by mouth daily. 30 Bottle 0  . sucralfate  (CARAFATE) 1 g tablet Take 1 tablet by mouth 4 (four) times daily -  before meals and at bedtime.  0  . vitamin B-12 (CYANOCOBALAMIN) 500 MCG tablet Take 1 tablet (500 mcg total) by mouth daily. 30 tablet 1  . albuterol (VENTOLIN HFA) 108 (90 Base) MCG/ACT inhaler Inhale 1 puff into the lungs every 6 (six) hours as needed for wheezing or shortness of breath. 18 g 2  . FLUoxetine (PROZAC) 40 MG capsule Take 1 capsule (40 mg total) by mouth daily. 90 capsule 1  . Fluticasone-Salmeterol (ADVAIR) 100-50 MCG/DOSE AEPB Inhale 1 puff into the lungs 2 (two) times daily. 1 each 3  . pantoprazole (PROTONIX) 40 MG tablet Take 1 tablet (40 mg total) by mouth daily. 90 tablet 1  . rosuvastatin (CRESTOR) 20 MG tablet Take 1 tablet (20 mg total) by mouth daily. 90 tablet 1  . tiotropium (SPIRIVA HANDIHALER) 18 MCG inhalation capsule Place 1 capsule (18 mcg total) into inhaler and inhale daily. 90 capsule 1  . zolpidem (AMBIEN) 5 MG tablet Take 1 tablet (5 mg total) by mouth at bedtime. 30 tablet 0  . promethazine (PHENERGAN) 25 MG suppository Place 1 suppository (25 mg total) rectally every 6 (six) hours as needed for nausea or vomiting. (Patient not taking: Reported on 03/20/2019) 12 each 0   No facility-administered medications prior to visit.      ROS Review of Systems  Constitutional: Positive for appetite change, fatigue and unexpected weight change. Negative for activity change.  HENT: Negative for sinus pressure and sore throat.   Eyes: Negative for visual disturbance.  Respiratory: Negative for cough, chest tightness and shortness of breath.   Cardiovascular: Negative for chest pain and leg swelling.  Gastrointestinal: Positive for nausea. Negative for abdominal distention, abdominal pain, constipation and diarrhea.  Endocrine: Negative.   Genitourinary: Negative for dysuria.  Musculoskeletal: Negative for joint swelling and myalgias.  Skin: Negative for rash.  Allergic/Immunologic: Negative.    Neurological: Positive for weakness and numbness. Negative for light-headedness.  Psychiatric/Behavioral: Negative for dysphoric mood and suicidal ideas.    Objective:  BP 109/74   Pulse 75   Temp 98.3 F (36.8 C) (Oral)   Ht 5\' 9"  (1.753 m)   Wt 181 lb 3.2 oz (82.2 kg)   SpO2  100%   BMI 26.76 kg/m   BP/Weight 03/20/2019 03/19/2019 03/04/2019  Systolic BP 109 110 119  Diastolic BP 74 72 86  Wt. (Lbs) 181.2 183.3 180  BMI 26.76 27.07 26.58      Physical Exam Constitutional:      Appearance: He is well-developed. He is ill-appearing.  Cardiovascular:     Rate and Rhythm: Normal rate.     Heart sounds: Normal heart sounds. No murmur.  Pulmonary:     Effort: Pulmonary effort is normal.     Breath sounds: Normal breath sounds. No wheezing or rales.  Chest:     Chest wall: No tenderness.  Abdominal:     General: Bowel sounds are normal. There is no distension.     Palpations: Abdomen is soft. There is no mass.     Tenderness: There is no abdominal tenderness.     Comments: PEG tube in place  Musculoskeletal: Normal range of motion.  Neurological:     Mental Status: He is alert and oriented to person, place, and time.     Comments: Motor strength: Right lower extremity-3+/5 Left lower extremity-4+/5     CMP Latest Ref Rng & Units 03/04/2019 02/19/2019 01/11/2019  Glucose 70 - 99 mg/dL 92 79 -  BUN 6 - 20 mg/dL 51(O) 18 12  Creatinine 0.61 - 1.24 mg/dL 8.41(Y) 6.06(T) 0.16(W)  Sodium 135 - 145 mmol/L 140 142 -  Potassium 3.5 - 5.1 mmol/L 4.2 4.1 -  Chloride 98 - 111 mmol/L 105 109 -  CO2 22 - 32 mmol/L 28 26 -  Calcium 8.9 - 10.3 mg/dL 9.0 1.0(X) -  Total Protein 6.5 - 8.1 g/dL 7.3 6.3(L) -  Total Bilirubin 0.3 - 1.2 mg/dL 0.5 0.4 -  Alkaline Phos 38 - 126 U/L 51 45 -  AST 15 - 41 U/L 11(L) 9(L) -  ALT 0 - 44 U/L 5 7 -    Lipid Panel     Component Value Date/Time   CHOL 180 07/25/2018 0949   TRIG 168 (H) 12/11/2018 0443   HDL 24 (L) 07/25/2018 0949   CHOLHDL  7.5 (H) 07/25/2018 0949   CHOLHDL 5.2 (H) 09/21/2016 0900   VLDL 31 (H) 09/21/2016 0900   LDLCALC 126 (H) 07/25/2018 0949    CBC    Component Value Date/Time   WBC 5.9 03/04/2019 1706   RBC 3.37 (L) 03/04/2019 1706   HGB 9.6 (L) 03/04/2019 1706   HGB 9.0 (L) 12/25/2018 0810   HGB 14.4 10/10/2017 1504   HCT 31.9 (L) 03/04/2019 1706   HCT 39.8 02/25/2018 0231   PLT 212 03/04/2019 1706   PLT 202 12/25/2018 0810   PLT 274 10/10/2017 1504   MCV 94.7 03/04/2019 1706   MCV 83 10/10/2017 1504   MCH 28.5 03/04/2019 1706   MCHC 30.1 03/04/2019 1706   RDW 13.0 03/04/2019 1706   RDW 13.6 10/10/2017 1504   LYMPHSABS 0.5 (L) 03/04/2019 1706   LYMPHSABS 3.2 (H) 10/10/2017 1504   MONOABS 0.6 03/04/2019 1706   EOSABS 0.1 03/04/2019 1706   EOSABS 0.1 10/10/2017 1504   BASOSABS 0.0 03/04/2019 1706   BASOSABS 0.0 10/10/2017 1504    Lab Results  Component Value Date   HGBA1C 5.0 03/20/2019    Assessment & Plan:   1. Diabetes mellitus due to underlying condition with hyperosmolarity without coma, without long-term current use of insulin (HCC) Diet controlled Continue diabetic diet and lifestyle modifications He does have diabetic neuropathy but states he did not  do well on gabapentin in the past We will commence Cymbalta - POCT glucose (manual entry) - POCT glycosylated hemoglobin (Hb A1C) - rosuvastatin (CRESTOR) 20 MG tablet; Take 1 tablet (20 mg total) by mouth daily.  Dispense: 90 tablet; Refill: 1  2. Other insomnia Controlled - zolpidem (AMBIEN) 5 MG tablet; Take 1 tablet (5 mg total) by mouth at bedtime.  Dispense: 30 tablet; Refill: 3  3. COPD with asthma (HCC) No recent exacerbation - albuterol (VENTOLIN HFA) 108 (90 Base) MCG/ACT inhaler; Inhale 1 puff into the lungs every 6 (six) hours as needed for wheezing or shortness of breath.  Dispense: 54 g; Refill: 1 - Fluticasone-Salmeterol (ADVAIR) 100-50 MCG/DOSE AEPB; Inhale 1 puff into the lungs 2 (two) times daily.   Dispense: 3 each; Refill: 1 - tiotropium (SPIRIVA HANDIHALER) 18 MCG inhalation capsule; Place 1 capsule (18 mcg total) into inhaler and inhale daily.  Dispense: 90 capsule; Refill: 1  4. Gastroesophageal reflux disease without esophagitis Stable - pantoprazole (PROTONIX) 40 MG tablet; Take 1 tablet (40 mg total) by mouth daily.  Dispense: 90 tablet; Refill: 1  5. Anxiety and depression Stable We will substitute Prozac with Cymbalta given analgesic effect of the latter. He has been provided instructions to taper off Prozac over the next 2 weeks prior to commencing Cymbalta  6. 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  7.  Leg weakness Could be secondary to fatigue versus brain metastasis and possibly edema from brain radiation I have commenced Cymbalta which will hopefully help with neuropathy as he declines initiation of gabapentin Advised to speak with his radiation oncologist regarding possibly trial of prednisone in the event that this is of intracranial origin Would like to refer for PT however he declines at this time due to several ongoing medical appointments.  Meds ordered this encounter  Medications  . zolpidem (AMBIEN) 5 MG tablet    Sig: Take 1 tablet (5 mg total) by mouth at bedtime.    Dispense:  30 tablet    Refill:  3  . albuterol (VENTOLIN HFA) 108 (90 Base) MCG/ACT inhaler    Sig: Inhale 1 puff into the lungs every 6 (six) hours as needed for wheezing or shortness of breath.    Dispense:  54 g    Refill:  1  . DULoxetine (CYMBALTA) 60 MG capsule    Sig: Take 1 capsule (60 mg total) by mouth daily.    Dispense:  90 capsule    Refill:  1    Discontinue Prozac  . Fluticasone-Salmeterol (ADVAIR) 100-50 MCG/DOSE AEPB    Sig: Inhale 1 puff into the lungs 2  (two) times daily.    Dispense:  3 each    Refill:  1    Discontinue Asmanex  . pantoprazole (PROTONIX) 40 MG tablet    Sig: Take 1 tablet (40 mg total) by mouth daily.    Dispense:  90 tablet    Refill:  1    Discontinue daily dosing  . rosuvastatin (CRESTOR) 20 MG tablet    Sig: Take 1 tablet (20 mg total) by mouth daily.    Dispense:  90 tablet    Refill:  1    Discontinue Lipitor  . tiotropium (SPIRIVA HANDIHALER) 18 MCG inhalation capsule    Sig: Place 1 capsule (18 mcg total) into inhaler and inhale  daily.    Dispense:  90 capsule    Refill:  1    Follow-up: Return in about 3 months (around 06/19/2019) for follow up of chronic medical conditions.       [name], MD, FAAFP. [facility] [place], [place] [phone]   03/20/2019, 5:36 PM

## 2019-03-22 ENCOUNTER — Encounter (HOSPITAL_COMMUNITY): Payer: Self-pay | Admitting: Student

## 2019-03-22 ENCOUNTER — Ambulatory Visit (HOSPITAL_COMMUNITY)
Admission: RE | Admit: 2019-03-22 | Discharge: 2019-03-22 | Disposition: A | Discharge status: Home / Self Care | Payer: Medicare Other | Source: Ambulatory Visit | Attending: Internal Medicine | Admitting: Internal Medicine

## 2019-03-22 ENCOUNTER — Ambulatory Visit: Payer: Medicare Other | Admitting: Family Medicine

## 2019-03-22 ENCOUNTER — Other Ambulatory Visit: Payer: Self-pay

## 2019-03-22 DIAGNOSIS — Z431 Encounter for attention to gastrostomy: Secondary | ICD-10-CM | POA: Diagnosis not present

## 2019-03-22 DIAGNOSIS — R109 Unspecified abdominal pain: Secondary | ICD-10-CM | POA: Insufficient documentation

## 2019-03-22 DIAGNOSIS — C3411 Malignant neoplasm of upper lobe, right bronchus or lung: Secondary | ICD-10-CM | POA: Diagnosis not present

## 2019-03-22 DIAGNOSIS — Z931 Gastrostomy status: Secondary | ICD-10-CM | POA: Diagnosis not present

## 2019-03-22 HISTORY — PX: IR GASTROSTOMY TUBE REMOVAL: IMG5492

## 2019-03-22 MED ORDER — LIDOCAINE VISCOUS HCL 2 % MT SOLN
OROMUCOSAL | Status: AC
  Filled 2019-03-22: qty 15

## 2019-03-22 NOTE — Progress Notes (Signed)
Patient presented to radiology today for gastrostomy tube removal. States he has been having abdominal pain related to tube, but that he is not using tube frequently.  He has increased his PO intake and is able to meet his needs without tube at this time.  Balloon deflated; 7 mL saline removed. Tube removed without complication.  Site assessed.  No bleeding, no pus, or erythema or warmth. No indication of infection or obvious source of pain. Dressing placed. Patient given care instructions.   Brynda Greathouse, MS RD PA-C

## 2019-03-22 NOTE — Progress Notes (Signed)
Radiation Oncology         [phone] ________________________________  Name: Andrew West MRN: [id number]  Date: 02/06/2019  DOB: 10-Mar-1966  SIMULATION AND TREATMENT PLANNING NOTE   DIAGNOSIS:     ICD-10-CM   1. Malignant neoplasm of bronchus of right upper lobe (HCC) C34.11      The patient presented for simulation for the patient's upcoming course of whole brain radiation treatment. This will be for a course of prophylactic cranial irradiation. The patient was placed in a supine position and a customized thermoplastic head cast was constructed to aid in patient immobilization during the treatment. This complex treatment device will be used on a daily basis. In this fashion a CT scan was obtained through the head and neck region and isocenter was placed near midline within the brain.  The patient will be planned to receive a course of whole brain radiation treatment to a dose of 25 gray in 10 fractions at 2.5 gray per fraction. To accomplish this, 2 customized blocks have been designed which corresponds to left and right whole brain radiation fields. These 2 complex treatment devices will be used on a daily basis during the course of radiation. A complex isodose plan is requested to insure that the target area is adequately covered in to facilitate optimization of the treatment plan. A forward planning technique will also be evaluated to determine if this approach significantly improves the plan.    ________________________________   Radene Gunning, MD, PhD

## 2019-03-22 NOTE — Addendum Note (Signed)
Encounter addended by: Kyung Rudd, MD on: 03/22/2019 12:07 PM  Actions taken: Visit diagnoses modified, Clinical Note Signed

## 2019-03-23 ENCOUNTER — Telehealth: Payer: Self-pay | Admitting: Internal Medicine

## 2019-03-23 NOTE — Telephone Encounter (Signed)
Patient seen in office by MD 03/19/2019. He requested disability paperwork be completed. Patient called and notified this has been completed by MD and will be at front desk for pick up at his convenience. He voiced understanding.

## 2019-03-26 ENCOUNTER — Inpatient Hospital Stay: Payer: Medicare Other | Attending: Internal Medicine

## 2019-03-26 ENCOUNTER — Other Ambulatory Visit: Payer: Self-pay

## 2019-03-26 ENCOUNTER — Inpatient Hospital Stay: Payer: Medicare Other

## 2019-03-26 ENCOUNTER — Ambulatory Visit (HOSPITAL_COMMUNITY): Payer: Medicare Other

## 2019-03-26 DIAGNOSIS — Z95828 Presence of other vascular implants and grafts: Secondary | ICD-10-CM | POA: Insufficient documentation

## 2019-03-26 DIAGNOSIS — C3411 Malignant neoplasm of upper lobe, right bronchus or lung: Secondary | ICD-10-CM | POA: Diagnosis not present

## 2019-03-26 DIAGNOSIS — R0789 Other chest pain: Secondary | ICD-10-CM | POA: Diagnosis not present

## 2019-03-26 LAB — CBC WITH DIFFERENTIAL (CANCER CENTER ONLY)
Abs Immature Granulocytes: 0.02 10*3/uL (ref 0.00–0.07)
Basophils Absolute: 0 10*3/uL (ref 0.0–0.1)
Basophils Relative: 0 %
Eosinophils Absolute: 0.1 10*3/uL (ref 0.0–0.5)
Eosinophils Relative: 1 %
HCT: 27.8 % — ABNORMAL LOW (ref 39.0–52.0)
Hemoglobin: 8.9 g/dL — ABNORMAL LOW (ref 13.0–17.0)
Immature Granulocytes: 0 %
Lymphocytes Relative: 10 %
Lymphs Abs: 0.6 10*3/uL — ABNORMAL LOW (ref 0.7–4.0)
MCH: 29 pg (ref 26.0–34.0)
MCHC: 32 g/dL (ref 30.0–36.0)
MCV: 90.6 fL (ref 80.0–100.0)
Monocytes Absolute: 0.7 10*3/uL (ref 0.1–1.0)
Monocytes Relative: 13 %
Neutro Abs: 4.2 10*3/uL (ref 1.7–7.7)
Neutrophils Relative %: 76 %
Platelet Count: 170 10*3/uL (ref 150–400)
RBC: 3.07 MIL/uL — ABNORMAL LOW (ref 4.22–5.81)
RDW: 12.7 % (ref 11.5–15.5)
WBC Count: 5.6 10*3/uL (ref 4.0–10.5)
nRBC: 0 % (ref 0.0–0.2)

## 2019-03-26 LAB — CMP (CANCER CENTER ONLY)
ALT: <6 U/L (ref 0–44)
AST: 8 U/L — ABNORMAL LOW (ref 15–41)
Albumin: 3.2 g/dL — ABNORMAL LOW (ref 3.5–5.0)
Alkaline Phosphatase: 57 U/L (ref 38–126)
Anion gap: 9 (ref 5–15)
BUN: 13 mg/dL (ref 6–20)
CO2: 29 mmol/L (ref 22–32)
Calcium: 8.8 mg/dL — ABNORMAL LOW (ref 8.9–10.3)
Chloride: 106 mmol/L (ref 98–111)
Creatinine: 1.87 mg/dL — ABNORMAL HIGH (ref 0.61–1.24)
GFR, Est AFR Am: 47 mL/min — ABNORMAL LOW (ref >60)
GFR, Estimated: 40 mL/min — ABNORMAL LOW (ref >60)
Glucose, Bld: 76 mg/dL (ref 70–99)
Potassium: 4.1 mmol/L (ref 3.5–5.1)
Sodium: 144 mmol/L (ref 135–145)
Total Bilirubin: <0.2 mg/dL — ABNORMAL LOW (ref 0.3–1.2)
Total Protein: 6.9 g/dL (ref 6.5–8.1)

## 2019-03-28 ENCOUNTER — Other Ambulatory Visit: Payer: Self-pay

## 2019-03-28 ENCOUNTER — Inpatient Hospital Stay (HOSPITAL_BASED_OUTPATIENT_CLINIC_OR_DEPARTMENT_OTHER): Payer: Medicare Other | Admitting: Internal Medicine

## 2019-03-28 ENCOUNTER — Encounter: Payer: Self-pay | Admitting: Internal Medicine

## 2019-03-28 ENCOUNTER — Telehealth: Payer: Self-pay | Admitting: Internal Medicine

## 2019-03-28 VITALS — BP 134/95 | HR 72 | Temp 98.3°F | Resp 18 | Ht 69.0 in | Wt 178.9 lb

## 2019-03-28 DIAGNOSIS — R0789 Other chest pain: Secondary | ICD-10-CM

## 2019-03-28 DIAGNOSIS — Z95828 Presence of other vascular implants and grafts: Secondary | ICD-10-CM | POA: Diagnosis not present

## 2019-03-28 DIAGNOSIS — C3411 Malignant neoplasm of upper lobe, right bronchus or lung: Secondary | ICD-10-CM

## 2019-03-28 NOTE — Telephone Encounter (Signed)
Scheduled appt per 5/6 los - f/u and scan in 3 months - sent reminder letter in the mail with apt date and time

## 2019-03-28 NOTE — Progress Notes (Signed)
[name] [phone]   [phone]  OFFICE PROGRESS NOTE  [name], MD [place] [id number]  DIAGNOSIS: Limited stage (T3, N3, M0) small cell lung cancer presented with right paratracheal mass in addition to right suprahilar mass and lymphadenopathy as well as right cervical lymph node diagnosed in October 2019  PRIOR THERAPY:  systemic chemotherapy with cisplatin 80 mg/M2 on day 1 and etoposide 100 mg/M2 on days 1, 2 and 3 every 3 weeks.  Status post 3 cycles.  This will be concurrent with radiotherapy with the start of cycle #2.  Starting from cycle #4 his dose of cisplatin would be 60 mg/M2 and etoposide 90 mg/M2.  Last dose was given November 23, 2018.  CURRENT THERAPY: Observation.  INTERVAL HISTORY: Andrew West 53 y.o. male returns to the clinic today for follow-up visit.  The patient is feeling fine today with no concerning complaints except for intermittent right anterior chest pain.  He takes pain medication on an as-needed basis.  He denied having any shortness of breath, cough or hemoptysis.  He denied having any recent weight loss or night sweats.  He has the PEG tube removed recently.  He is also interested and having the Port-A-Cath removed but I told him to keep it for a little bit longer for concern of any disease recurrence.  The patient denied having any fever or chills.  He has no nausea, vomiting, diarrhea or constipation.  He has no headache or visual changes.  MEDICAL HISTORY: Past Medical History:  Diagnosis Date   Anxiety    CAD (coronary artery disease)    a. s/p multiple PCIs with last cath 11/2016 with severe multivessel CAD, s/p PCTA to LCx but unable to pass stent   Cancer (HCC)    Chronic leg pain    bilateral   Chronic lower back pain    COPD (chronic obstructive pulmonary disease) (HCC)    Depression    GERD (gastroesophageal reflux disease)    Takes Dexilant   HLD  (hyperlipidemia)    Hypertension    Rhabdomyolysis    h/o, r/t statins   Sleep apnea    "can't tolerate mask" (12/16/2016)   Type II diabetes mellitus (HCC)     ALLERGIES:  is allergic to iohexol.  MEDICATIONS:  Current Outpatient Medications  Medication Sig Dispense Refill   ACCU-CHEK FASTCLIX LANCETS MISC Use as directed 102 each 12   albuterol (VENTOLIN HFA) 108 (90 Base) MCG/ACT inhaler Inhale 1 puff into the lungs every 6 (six) hours as needed for wheezing or shortness of breath. 54 g 1   aspirin 81 MG tablet Take 1 tablet (81 mg total) daily by mouth. 30 tablet    Cholecalciferol (VITAMIN D) 2000 units tablet Take 1 tablet (2,000 Units total) by mouth daily. 30 tablet 1   clopidogrel (PLAVIX) 75 MG tablet Take 1 tablet (75 mg total) by mouth daily. YOU MAY RESTART 08/26/2018 90 tablet 3   diclofenac sodium (VOLTAREN) 1 % GEL Apply 2 g topically 4 (four) times daily. 1 Tube 0   dronabinol (MARINOL) 5 MG capsule Take 1 capsule (5 mg total) by mouth 2 (two) times daily before a meal. 60 capsule 0   DULoxetine (CYMBALTA) 60 MG capsule Take 1 capsule (60 mg total) by mouth daily. 90 capsule 1   Fluticasone-Salmeterol (ADVAIR) 100-50 MCG/DOSE AEPB Inhale 1 puff into the lungs 2 (two) times daily. 3 each 1  glucose blood (ACCU-CHEK GUIDE) test strip Use as instructed 100 each 12   HYDROcodone-acetaminophen (NORCO/VICODIN) 5-325 MG tablet Take 1 tablet by mouth every 4 (four) hours as needed. 8 tablet 0   LORazepam (ATIVAN) 0.5 MG tablet 1 tab po q 4-6 hours prn or 1 tab po 30 minutes prior to radiation 30 tablet 0   mirtazapine (REMERON) 30 MG tablet Take 1 tablet (30 mg total) by mouth at bedtime. 30 tablet 2   nebivolol (BYSTOLIC) 5 MG tablet Take 1 tablet (5 mg total) by mouth daily. 90 tablet 3   nitroGLYCERIN (NITROSTAT) 0.4 MG SL tablet Place 1 tablet (0.4 mg total) under the tongue every 5 (five) minutes as needed for chest pain. 25 tablet 2   ondansetron (ZOFRAN  ODT) 4 MG disintegrating tablet Take 1 tablet (4 mg total) by mouth every 8 (eight) hours as needed for nausea or vomiting. 20 tablet 0   pantoprazole (PROTONIX) 40 MG tablet Take 1 tablet (40 mg total) by mouth daily. 90 tablet 1   prochlorperazine (COMPAZINE) 10 MG tablet Take 1 tablet (10 mg total) by mouth every 6 (six) hours as needed for nausea or vomiting. 30 tablet 0   promethazine (PHENERGAN) 25 MG suppository Place 1 suppository (25 mg total) rectally every 6 (six) hours as needed for nausea or vomiting. (Patient not taking: Reported on 03/20/2019) 12 each 0   ranolazine (RANEXA) 1000 MG SR tablet Take 1 tablet (1,000 mg total) by mouth 2 (two) times daily. 180 tablet 3   rosuvastatin (CRESTOR) 20 MG tablet Take 1 tablet (20 mg total) by mouth daily. 90 tablet 1   Sterile Water LIQD Take 30 Bottles by mouth daily. 30 Bottle 0   sucralfate (CARAFATE) 1 g tablet Take 1 tablet by mouth 4 (four) times daily -  before meals and at bedtime.  0   tiotropium (SPIRIVA HANDIHALER) 18 MCG inhalation capsule Place 1 capsule (18 mcg total) into inhaler and inhale daily. 90 capsule 1   vitamin B-12 (CYANOCOBALAMIN) 500 MCG tablet Take 1 tablet (500 mcg total) by mouth daily. 30 tablet 1   zolpidem (AMBIEN) 5 MG tablet Take 1 tablet (5 mg total) by mouth at bedtime. 30 tablet 3   No current facility-administered medications for this visit.     SURGICAL HISTORY:  Past Surgical History:  Procedure Laterality Date   BACK SURGERY     BRONCHIAL BIOPSY  08/25/2018   Procedure: BRONCHIAL BIOPSIES;  Surgeon: Garner Nash, DO;  Location: [facility];  Service: Cardiopulmonary;;   CARDIAC CATHETERIZATION N/A 09/25/2015   Procedure: Left Heart Cath and Coronary Angiography;  Surgeon: Leonie Man, MD;  Location: [place] [facility];  Service: Cardiovascular;  Laterality: N/A;   CARDIAC CATHETERIZATION N/A 12/16/2016   Procedure: Left Heart Cath and Coronary Angiography;  Surgeon: Leonie Man, MD;  Location: [place] [facility];  Service: Cardiovascular;  Laterality: N/A;   CARDIAC CATHETERIZATION N/A 12/16/2016   Procedure: Coronary Balloon Angioplasty;  Surgeon: Leonie Man, MD;  Location: [place] [facility];  Service: Cardiovascular;  Laterality: N/A;   COLONOSCOPY W/ POLYPECTOMY     CORONARY ANGIOPLASTY  09/25/2015   mid cir & om   CORONARY ANGIOPLASTY WITH STENT PLACEMENT  10/09/2001   PTCA & stenting of mid AV circumflex; 2.5x5m Pixel stent   CORONARY ANGIOPLASTY WITH STENT PLACEMENT  12/13/2001   PCI with stent to mid L circumflex, 95% stenosis to 0% residual   CORONARY ANGIOPLASTY WITH  STENT PLACEMENT  10/10/2003   PCI to mid AV circumflex; LAD 30% disease; RCA 100% occluded prox.   CORONARY ANGIOPLASTY WITH STENT PLACEMENT  09/01/2011   PCI with stenting with bare metal stent to mid AV groove circumflex and PDA   CORONARY ANGIOPLASTY WITH STENT PLACEMENT  10/17/2011   cutting balloon angioplasty of ostial lateral OM1 branch and bifurcation AV groove circumflex OM junction; stenosis reduced to 0%   ENDOBRONCHIAL ULTRASOUND Bilateral 08/25/2018   Procedure: ENDOBRONCHIAL ULTRASOUND;  Surgeon: Garner Nash, DO;  Location: [facility];  Service: Cardiopulmonary;  Laterality: Bilateral;   ESOPHAGOGASTRODUODENOSCOPY (EGD) WITH PROPOFOL N/A 12/08/2018   Procedure: ESOPHAGOGASTRODUODENOSCOPY (EGD) WITH PROPOFOL;  Surgeon: Milus Banister, MD;  Location: [facility];  Service: Endoscopy;  Laterality: N/A;   EXCISIONAL HEMORRHOIDECTOMY     FINE NEEDLE ASPIRATION  08/25/2018   Procedure: FINE NEEDLE ASPIRATION;  Surgeon: Garner Nash, DO;  Location: [facility];  Service: Cardiopulmonary;;   FLEXIBLE BRONCHOSCOPY  08/25/2018   Procedure: FLEXIBLE BRONCHOSCOPY;  Surgeon: Garner Nash, DO;  Location: [facility];  Service: Cardiopulmonary;;   IR GASTROSTOMY TUBE MOD SED  12/11/2018   IR GASTROSTOMY TUBE REMOVAL  03/22/2019   IR IMAGING GUIDED  PORT INSERTION  09/29/2018   LEFT HEART CATHETERIZATION WITH CORONARY ANGIOGRAM N/A 10/18/2011   Procedure: LEFT HEART CATHETERIZATION WITH CORONARY ANGIOGRAM;  Surgeon: Leonie Man, MD;  Location: [facility];  Service: Cardiovascular;  Laterality: N/A;   LUMBAR LAMINECTOMY/DECOMPRESSION MICRODISCECTOMY  03/31/2012   Procedure: LUMBAR LAMINECTOMY/DECOMPRESSION MICRODISCECTOMY 1 LEVEL;  Surgeon: Charlie Pitter, MD;  Location: [place] [facility];  Service: Neurosurgery;  Laterality: Left;   TRANSTHORACIC ECHOCARDIOGRAM  07/28/2011   EF 55-65%; LVH, grade 1 diastolic dysfunction;     REVIEW OF SYSTEMS:  A comprehensive review of systems was negative except for: Constitutional: positive for fatigue Respiratory: positive for pleurisy/chest pain   PHYSICAL EXAMINATION: General appearance: alert, cooperative, fatigued and no distress Head: Normocephalic, without obvious abnormality, atraumatic Neck: no adenopathy, no JVD, supple, symmetrical, trachea midline and thyroid not enlarged, symmetric, no tenderness/mass/nodules Lymph nodes: Cervical, supraclavicular, and axillary nodes normal. Resp: clear to auscultation bilaterally Back: symmetric, no curvature. ROM normal. No CVA tenderness. Cardio: regular rate and rhythm, S1, S2 normal, no murmur, click, rub or gallop GI: soft, non-tender; bowel sounds normal; no masses,  no organomegaly Extremities: extremities normal, atraumatic, no cyanosis or edema  ECOG PERFORMANCE STATUS: 1 - Symptomatic but completely ambulatory  Blood pressure (!) 134/95, pulse 72, temperature 98.3 F (36.8 C), temperature source Oral, resp. rate 18, height _0  (1.753 m), weight 178 lb 14.4 oz (81.1 kg), SpO2 100 %.  LABORATORY DATA: Lab Results  Component Value Date   WBC 5.6 03/26/2019   HGB 8.9 (L) 03/26/2019   HCT 27.8 (L) 03/26/2019   MCV 90.6 03/26/2019   PLT 170 03/26/2019      Chemistry      Component Value Date/Time   NA 144 03/26/2019 0958   NA 144  07/25/2018 0949   K 4.1 03/26/2019 0958   CL 106 03/26/2019 0958   CO2 29 03/26/2019 0958   BUN 13 03/26/2019 0958   BUN 10 07/25/2018 0949   CREATININE 1.87 (H) 03/26/2019 0958   CREATININE 0.96 12/13/2016 1151      Component Value Date/Time   CALCIUM 8.8 (L) 03/26/2019 0958   ALKPHOS 57 03/26/2019 0958   AST 8 (L) 03/26/2019 0958   ALT <6 03/26/2019 0958   BILITOT <0.2 (L)  03/26/2019 0958       RADIOGRAPHIC STUDIES: Ct Abdomen Pelvis Wo Contrast  Result Date: 03/04/2019 CLINICAL DATA:  Vomiting for 2 days. Chest pain with deep breaths. Upper abdominal pain, nausea and vomiting. History of lung cancer. EXAM: CT CHEST, ABDOMEN AND PELVIS WITHOUT CONTRAST TECHNIQUE: Multidetector CT imaging of the chest, abdomen and pelvis was performed following the standard protocol without IV contrast. COMPARISON:  Acute abdomen series from earlier same day. Chest CT dated 12/01/2018. CT abdomen dated 12/01/2018. FINDINGS: CT CHEST FINDINGS Cardiovascular: Aortic atherosclerosis. No thoracic aortic aneurysm. Heart size is normal. Three-vessel coronary artery calcifications. Mediastinum/Nodes: Esophagus is unremarkable. No mass or enlarged lymph nodes seen within the mediastinum or perihilar regions. Suspect stability of the previously described mildly enlarged lymph nodes in the RIGHT paratracheal region and subcarinal region, less well seen today. Trachea is unremarkable. Lungs/Pleura: New small RIGHT pleural effusion. Patchy ill-defined consolidations within the RIGHT lower lobe, most likely atelectasis, pneumonia considered less likely. Previously described nodular consolidations at the RIGHT lung base are not seen but may be obscured by the ill-defined consolidations that are present today. Minimal atelectasis at the LEFT lung base. Biapical emphysematous change and associated scarring/fibrosis. Musculoskeletal: Mild degenerative spurring within the thoracic spine. No acute or suspicious osseous finding.  CT ABDOMEN PELVIS FINDINGS Hepatobiliary: No focal liver abnormality is seen. No gallstones, gallbladder wall thickening, or biliary dilatation. Pancreas: Unremarkable. No pancreatic ductal dilatation or surrounding inflammatory changes. Spleen: Normal in size without focal abnormality. Adrenals/Urinary Tract: Adrenal glands appear normal. Kidneys are unremarkable without suspicious mass, stone or hydronephrosis. Probable small cyst exophytic to the lateral cortex of the RIGHT kidney, too small to definitively characterize. No perinephric fluid. No ureteral or bladder calculi identified. Bladder is unremarkable. Stomach/Bowel: No dilated large or small bowel loops. No evidence of bowel wall inflammation. Appendix is normal. Gastrostomy tube appears appropriately positioned within the gastric body. Stomach appears otherwise unremarkable. Vascular/Lymphatic: Aortic atherosclerosis. No enlarged lymph nodes seen in the abdomen or pelvis. Reproductive: Prostate gland is mildly prominent causing slight mass effect on the bladder base. Other: Trace free fluid in the lower pelvis. No abscess collection seen. No free intraperitoneal air. Musculoskeletal: No acute or suspicious osseous finding. Mild edema/fluid stranding within the subcutaneous soft tissues suggesting anasarca. IMPRESSION: 1. New small right pleural effusion. Patchy ill-defined consolidations within the right lower lobe, most likely atelectasis or aspiration, pneumonia considered less likely. 2. Trace free fluid in the lower pelvis. Abdomen and pelvis are otherwise unremarkable. No bowel obstruction or evidence of bowel wall inflammation. No evidence of acute solid organ abnormality. No renal or ureteral calculi. Appendix is normal. New gastrostomy tube appears appropriately positioned in the stomach. 3. Probable anasarca. 4. Additional chronic/incidental findings detailed above. Aortic Atherosclerosis (ICD10-I70.0) and Emphysema (ICD10-J43.9). Electronically  Signed   By: Franki Cabot M.D.   On: 03/04/2019 20:32   Ct Chest Wo Contrast  Result Date: 03/04/2019 CLINICAL DATA:  Vomiting for 2 days. Chest pain with deep breaths. Upper abdominal pain, nausea and vomiting. History of lung cancer. EXAM: CT CHEST, ABDOMEN AND PELVIS WITHOUT CONTRAST TECHNIQUE: Multidetector CT imaging of the chest, abdomen and pelvis was performed following the standard protocol without IV contrast. COMPARISON:  Acute abdomen series from earlier same day. Chest CT dated 12/01/2018. CT abdomen dated 12/01/2018. FINDINGS: CT CHEST FINDINGS Cardiovascular: Aortic atherosclerosis. No thoracic aortic aneurysm. Heart size is normal. Three-vessel coronary artery calcifications. Mediastinum/Nodes: Esophagus is unremarkable. No mass or enlarged lymph nodes seen within the mediastinum or  perihilar regions. Suspect stability of the previously described mildly enlarged lymph nodes in the RIGHT paratracheal region and subcarinal region, less well seen today. Trachea is unremarkable. Lungs/Pleura: New small RIGHT pleural effusion. Patchy ill-defined consolidations within the RIGHT lower lobe, most likely atelectasis, pneumonia considered less likely. Previously described nodular consolidations at the RIGHT lung base are not seen but may be obscured by the ill-defined consolidations that are present today. Minimal atelectasis at the LEFT lung base. Biapical emphysematous change and associated scarring/fibrosis. Musculoskeletal: Mild degenerative spurring within the thoracic spine. No acute or suspicious osseous finding. CT ABDOMEN PELVIS FINDINGS Hepatobiliary: No focal liver abnormality is seen. No gallstones, gallbladder wall thickening, or biliary dilatation. Pancreas: Unremarkable. No pancreatic ductal dilatation or surrounding inflammatory changes. Spleen: Normal in size without focal abnormality. Adrenals/Urinary Tract: Adrenal glands appear normal. Kidneys are unremarkable without suspicious mass,  stone or hydronephrosis. Probable small cyst exophytic to the lateral cortex of the RIGHT kidney, too small to definitively characterize. No perinephric fluid. No ureteral or bladder calculi identified. Bladder is unremarkable. Stomach/Bowel: No dilated large or small bowel loops. No evidence of bowel wall inflammation. Appendix is normal. Gastrostomy tube appears appropriately positioned within the gastric body. Stomach appears otherwise unremarkable. Vascular/Lymphatic: Aortic atherosclerosis. No enlarged lymph nodes seen in the abdomen or pelvis. Reproductive: Prostate gland is mildly prominent causing slight mass effect on the bladder base. Other: Trace free fluid in the lower pelvis. No abscess collection seen. No free intraperitoneal air. Musculoskeletal: No acute or suspicious osseous finding. Mild edema/fluid stranding within the subcutaneous soft tissues suggesting anasarca. IMPRESSION: 1. New small right pleural effusion. Patchy ill-defined consolidations within the right lower lobe, most likely atelectasis or aspiration, pneumonia considered less likely. 2. Trace free fluid in the lower pelvis. Abdomen and pelvis are otherwise unremarkable. No bowel obstruction or evidence of bowel wall inflammation. No evidence of acute solid organ abnormality. No renal or ureteral calculi. Appendix is normal. New gastrostomy tube appears appropriately positioned in the stomach. 3. Probable anasarca. 4. Additional chronic/incidental findings detailed above. Aortic Atherosclerosis (ICD10-I70.0) and Emphysema (ICD10-J43.9). Electronically Signed   By: Franki Cabot M.D.   On: 03/04/2019 20:32   Dg Abdomen Acute W/chest  Result Date: 03/04/2019 CLINICAL DATA:  Abdominal pain, recent PEG tube placement. Nausea and vomiting for 2 days. EXAM: DG ABDOMEN ACUTE W/ 1V CHEST COMPARISON:  Chest x-rays dated 12/01/2018 and 11/02/2018. FINDINGS: Single-view of the chest: Heart size and mediastinal contours are stable. LEFT chest  wall Port-A-Cath is stable in position with tip at the level of the mid SVC. The patchy nodular opacities in the RIGHT lower lobe are stable, corresponding to the peripheral nodular consolidations demonstrated on chest CT of 12/01/2018, likely stable or perhaps slightly improved. No new lung findings. No pleural effusion or pneumothorax seen. Osseous structures about the chest are unremarkable. Supine and upright views of the abdomen: Bowel gas pattern is nonobstructive. No evidence of soft tissue mass or abnormal fluid collection. No evidence of free intraperitoneal air. No evidence of renal or ureteral calculi. Peg tube overlying the LEFT upper abdomen. IMPRESSION: 1. Stable subtle nodular opacities in the RIGHT lower lobe, corresponding to the peripheral nodular consolidations demonstrated on chest CT of 12/01/2018, likely stable or perhaps slightly improved. Would consider chest CT at some point to directly compare with the earlier chest CT. 2. No evidence of a developing pneumonia or pulmonary edema. 3. No acute findings within the abdomen. Nonobstructive bowel gas pattern. Peg tube overlying the LEFT upper quadrant.  Electronically Signed   By: Franki Cabot M.D.   On: 03/04/2019 17:55   Ir Gastrostomy Tube Removal  Result Date: 03/22/2019 INDICATION: Patient is status post gastrostomy tube placed 12/11/2018 by Dr. Barbie Banner. States he has been having abdominal pain for the past month and not been using tube frequently. He has increased his by mouth intake. Request is made for removal of gastrostomy tube. EXAM: BEDSIDE REMOVAL OF GASTROSTOMY TUBE COMPARISON:  None. MEDICATIONS: None. CONTRAST:  None FLUOROSCOPY TIME:  None COMPLICATIONS: None immediate. PROCEDURE: A time-out was performed prior to the initiation of the procedure. The retention balloon was deflated with 7 mL of saline removed, and the existing gastrostomy tube was removed intact. A dressing was placed. The patient tolerated the procedure well  without immediate postprocedural complication. IMPRESSION: Successful bedside removal of balloon retention gastrostomy tube without complication. Read by: Brynda Greathouse PA-C Electronically Signed   By: Jacqulynn Cadet M.D.   On: 03/22/2019 13:10    ASSESSMENT AND PLAN: This is a very pleasant 53 years old African-American male recently diagnosed with limited stage small cell lung cancer and currently undergoing systemic chemotherapy with cisplatin and etoposide status post 4 cycles.  This is concurrent with radiotherapy. The patient has a rough time with his systemic chemotherapy as well as radiation.  He was admitted almost after every cycle of chemotherapy to the hospital with significant pancytopenia.  He was also complaining of dysphasia and odynophagia and has a PEG tube placed for nutrition. He is feeling much better recently.  His PEG tube was removed and the patient has been off nutrition orally. He had repeat CT scan of the chest, abdomen and pelvis performed recently.  I personally and independently reviewed the scan discussed the results with the patient today. His scan showed no concerning findings for disease progression. I recommended for him to continue on observation with repeat CT scan of the chest in 3 months. He was advised to call immediately if he has any concerning symptoms in the interval. The patient voices understanding of current disease status and treatment options and is in agreement with the current care plan. All questions were answered. The patient knows to call the clinic with any problems, questions or concerns. We can certainly see the patient much sooner if necessary.  Disclaimer: This note was dictated with voice recognition software. Similar sounding words can inadvertently be transcribed and may not be corrected upon review.

## 2019-03-29 ENCOUNTER — Ambulatory Visit: Payer: Medicare Other | Admitting: Internal Medicine

## 2019-03-29 NOTE — Progress Notes (Signed)
Radiation Oncology         [phone] ________________________________  Name: Andrew West MRN: [id number]  Date: 02/28/2019  DOB: [birth year]  End of Treatment Note  Diagnosis:   53 y.o. male with Limited stage small cell carcinoma of the right hilum with plans to consider PCI   Indication for treatment:  Palliative, prophylactic cranial irradiation  Radiation treatment dates:   02/14/2019 - 02/27/2019  Site/planned dose/actual dose:   Whole Brain / 25 Gy in 10 fractions / 20 Gy in 8 fractions   Beams/energy:   Isodose Plan / 6X Photon  Narrative: The patient tolerated radiation treatment relatively well.   He experienced headaches and some nausea, as well as unsteady gait. Radiotherapy was discontinued after 8 treatments.   Plan: The patient has discontinued radiation treatment. The patient will return to radiation oncology clinic as needed.  ------------------------------------------------  Radene Gunning, MD, PhD  This document serves as a record of services personally performed by Dorothy Puffer, MD. It was created on his behalf by Ivar Bury, a trained medical scribe. The creation of this record is based on the scribe's personal observations and the provider's statements to them. This document has been checked and approved by the attending provider.

## 2019-04-01 ENCOUNTER — Encounter (HOSPITAL_COMMUNITY): Payer: Self-pay | Admitting: Emergency Medicine

## 2019-04-01 ENCOUNTER — Other Ambulatory Visit: Payer: Self-pay

## 2019-04-01 ENCOUNTER — Emergency Department (HOSPITAL_COMMUNITY): Payer: Medicare Other

## 2019-04-01 ENCOUNTER — Emergency Department (HOSPITAL_COMMUNITY)
Admission: EM | Admit: 2019-04-01 | Discharge: 2019-04-02 | Disposition: A | Discharge status: Home / Self Care | Payer: Medicare Other | Attending: Emergency Medicine | Admitting: Emergency Medicine

## 2019-04-01 DIAGNOSIS — Z7982 Long term (current) use of aspirin: Secondary | ICD-10-CM | POA: Diagnosis not present

## 2019-04-01 DIAGNOSIS — C3410 Malignant neoplasm of upper lobe, unspecified bronchus or lung: Secondary | ICD-10-CM | POA: Diagnosis not present

## 2019-04-01 DIAGNOSIS — N183 Chronic kidney disease, stage 3 (moderate): Secondary | ICD-10-CM | POA: Diagnosis not present

## 2019-04-01 DIAGNOSIS — J9811 Atelectasis: Secondary | ICD-10-CM | POA: Diagnosis not present

## 2019-04-01 DIAGNOSIS — Z7901 Long term (current) use of anticoagulants: Secondary | ICD-10-CM | POA: Diagnosis not present

## 2019-04-01 DIAGNOSIS — R51 Headache: Secondary | ICD-10-CM | POA: Diagnosis not present

## 2019-04-01 DIAGNOSIS — Z85118 Personal history of other malignant neoplasm of bronchus and lung: Secondary | ICD-10-CM | POA: Diagnosis not present

## 2019-04-01 DIAGNOSIS — R112 Nausea with vomiting, unspecified: Secondary | ICD-10-CM | POA: Diagnosis present

## 2019-04-01 DIAGNOSIS — I129 Hypertensive chronic kidney disease with stage 1 through stage 4 chronic kidney disease, or unspecified chronic kidney disease: Secondary | ICD-10-CM | POA: Insufficient documentation

## 2019-04-01 DIAGNOSIS — Z87891 Personal history of nicotine dependence: Secondary | ICD-10-CM | POA: Diagnosis not present

## 2019-04-01 DIAGNOSIS — R519 Headache, unspecified: Secondary | ICD-10-CM

## 2019-04-01 DIAGNOSIS — E1122 Type 2 diabetes mellitus with diabetic chronic kidney disease: Secondary | ICD-10-CM | POA: Insufficient documentation

## 2019-04-01 DIAGNOSIS — Z79899 Other long term (current) drug therapy: Secondary | ICD-10-CM | POA: Diagnosis not present

## 2019-04-01 DIAGNOSIS — I2510 Atherosclerotic heart disease of native coronary artery without angina pectoris: Secondary | ICD-10-CM | POA: Diagnosis not present

## 2019-04-01 DIAGNOSIS — J449 Chronic obstructive pulmonary disease, unspecified: Secondary | ICD-10-CM | POA: Insufficient documentation

## 2019-04-01 MED ORDER — HYDROMORPHONE HCL 1 MG/ML IJ SOLN
1.0000 mg | Freq: Once | INTRAMUSCULAR | Status: AC
  Administered 2019-04-01: 1 mg via INTRAVENOUS
  Filled 2019-04-01: qty 1

## 2019-04-01 MED ORDER — ONDANSETRON HCL 4 MG/2ML IJ SOLN
4.0000 mg | Freq: Once | INTRAMUSCULAR | Status: AC
  Administered 2019-04-01: 23:00:00 4 mg via INTRAVENOUS
  Filled 2019-04-01: qty 2

## 2019-04-01 MED ORDER — SODIUM CHLORIDE 0.9 % IV BOLUS
1000.0000 mL | Freq: Once | INTRAVENOUS | Status: AC
  Administered 2019-04-01: 23:00:00 1000 mL via INTRAVENOUS

## 2019-04-01 NOTE — ED Triage Notes (Signed)
Pt reports having vomiting for the last several days and is currently under chemo and radiation with last chemo/radiation treatment 3 weeks ago. Pt reports 4 episodes of emesis today clear in color.

## 2019-04-01 NOTE — ED Provider Notes (Signed)
[facility] DEPT Provider Note   CSN: [id number] Arrival date & time: 04/01/19  2104    History   Chief Complaint Chief Complaint  Patient presents with  . Emesis    HPI Andrew West is a 53 y.o. male.     The history is provided by the patient and medical records. No language interpreter was used.  Emesis     53 year old male with history of lung cancer, CAD, COPD, GERD, diabetes presenting for evaluation of nausea and vomiting.  Patient report for the past 3 to 4 days he has had persistent nausea, unable to keep anything down, and feeling fatigue.  He also complaining of persistent headache, pain in his chest and occasional nonproductive cough.  Denies any associated fever chills, no dysuria, able to pass flatus and no change in bowel movement.  He reports last chemo treatment was 3 weeks ago.  He does not complain of any abdominal pain.  Past Medical History:  Diagnosis Date  . Anxiety   . CAD (coronary artery disease)    a. s/p multiple PCIs with last cath 11/2016 with severe multivessel CAD, s/p PCTA to LCx but unable to pass stent  . Cancer ([place])   . Chronic leg pain    bilateral  . Chronic lower back pain   . COPD (chronic obstructive pulmonary disease) ([place])   . Depression   . GERD (gastroesophageal reflux disease)    Takes Dexilant  . HLD (hyperlipidemia)   . Hypertension   . Rhabdomyolysis    h/o, r/t statins  . Sleep apnea    "can't tolerate mask" (12/16/2016)  . Type II diabetes mellitus [facility])     Patient Active Problem List   Diagnosis Date Noted  . Protein-calorie malnutrition, severe 12/26/2018  . Symptomatic anemia 12/25/2018  . Syncope 12/25/2018  . CKD (chronic kidney disease) stage 3, GFR 30-59 ml/min (HCC) 12/09/2018  . Radiation esophagitis   . Malnutrition of moderate degree 12/06/2018  . Esophageal stricture   . N&V (nausea and vomiting) 12/01/2018  . Antineoplastic chemotherapy induced pancytopenia ([place])  12/01/2018  . Odynophagia   . Dysphagia 10/08/2018  . Hypokalemia 10/08/2018  . Hypomagnesemia 10/08/2018  . Type 2 diabetes mellitus ([place]) 10/08/2018  . Anemia 10/08/2018  . Sleep apnea 10/08/2018  . 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 Goals of care, counseling/discussion 08/31/2018  . Encounter for antineoplastic chemotherapy 08/31/2018  . Encounter for smoking cessation counseling 08/31/2018  . Malignant neoplasm of bronchus of right upper lobe ([place]) 08/29/2018  . Mediastinal mass 08/17/2018  . Vitamin D deficiency 08/04/2018  . Diabetic peripheral neuropathy ([place]) 06/30/2018  . Substernal chest pain 02/25/2018  . Benign prostatic hyperplasia with urinary hesitancy 11/17/2017  . Preoperative cardiovascular examination 07/12/2017  . Refractory angina ([place]) 05/24/2017  . Complex regional pain syndrome type I 03/24/2017  . Progressive angina ([place]) -Class III 12/16/2016  . Anxiety 06/28/2016  . Diastolic dysfunction-grade 2 with EF 60-65% Oct 2016 03/22/2016  . Hypertension 10/03/2015  . CAD S/P multiple PCI's 10/03/2015  . Pulmonary nodule 06/24/2015  . Cough 06/24/2015  . COPD with asthma ([place]) 06/24/2015  . Reactive airway disease 06/04/2015  . DOE (dyspnea on exertion) 04/15/2015  . Lumbar disc herniation with radiculopathy 03/31/2012  . Presence of stent in left circumflex coronary artery 10/18/2011    Class: History of  . TOBACCO ABUSE 02/27/2009  . ABDOMINAL PAIN, LEFT LOWER QUADRANT 12/30/2008  . ABDOMINAL PAIN,  EPIGASTRIC 12/05/2008  . Anxiety and depression 09/05/2008  . Hereditary and idiopathic peripheral neuropathy 09/05/2008  . GERD 09/05/2008  . Cervical disc disorder with radiculopathy of cervical region 08/19/2008  . HLD (hyperlipidemia) 04/25/2008    Class: Diagnosis of  . ALLERGIC RHINITIS 04/25/2008  . Backache 04/25/2008  . Chest pain, unspecified 04/25/2008  . COLONIC POLYPS, HX OF 04/25/2008  . Obesity 04/18/2008     Class: Diagnosis of  . ANAL FISSURE, HX OF 04/18/2008  . Diabetes mellitus ([place]) 01/30/2008    Class: History of  . RECTAL BLEEDING 01/30/2008    Past Surgical History:  Procedure Laterality Date  . BACK SURGERY    . BRONCHIAL BIOPSY  08/25/2018   Procedure: BRONCHIAL BIOPSIES;  Surgeon: Garner Nash, DO;  Location: [facility];  Service: Cardiopulmonary;;  . CARDIAC CATHETERIZATION N/A 09/25/2015   Procedure: Left Heart Cath and Coronary Angiography;  Surgeon: Leonie Man, MD;  Location: [facility] CV LAB;  Service: Cardiovascular;  Laterality: N/A;  . CARDIAC CATHETERIZATION N/A 12/16/2016   Procedure: Left Heart Cath and Coronary Angiography;  Surgeon: Leonie Man, MD;  Location: [facility];  Service: Cardiovascular;  Laterality: N/A;  . CARDIAC CATHETERIZATION N/A 12/16/2016   Procedure: Coronary Balloon Angioplasty;  Surgeon: Leonie Man, MD;  Location: [facility] LAB;  Service: Cardiovascular;  Laterality: N/A;  . COLONOSCOPY W/ POLYPECTOMY    . CORONARY ANGIOPLASTY  09/25/2015   mid cir & om  . CORONARY ANGIOPLASTY WITH STENT PLACEMENT  10/09/2001   PTCA & stenting of mid AV circumflex; 2.5x31m Pixel stent  . CORONARY ANGIOPLASTY WITH STENT PLACEMENT  12/13/2001   PCI with stent to mid L circumflex, 95% stenosis to 0% residual  . CORONARY ANGIOPLASTY WITH STENT PLACEMENT  10/10/2003   PCI to mid AV circumflex; LAD 30% disease; RCA 100% occluded prox.  . CORONARY ANGIOPLASTY WITH STENT PLACEMENT  09/01/2011   PCI with stenting with bare metal stent to mid AV groove circumflex and PDA  . CORONARY ANGIOPLASTY WITH STENT PLACEMENT  10/17/2011   cutting balloon angioplasty of ostial lateral OM1 branch and bifurcation AV groove circumflex OM junction; stenosis reduced to 0%  . ENDOBRONCHIAL ULTRASOUND Bilateral 08/25/2018   Procedure: ENDOBRONCHIAL ULTRASOUND;  Surgeon: IGarner Nash DO;  [facility]: [facility];  Service: Cardiopulmonary;  Laterality:  Bilateral;  . ESOPHAGOGASTRODUODENOSCOPY (EGD) WITH PROPOFOL N/A 12/08/2018   Procedure: ESOPHAGOGASTRODUODENOSCOPY (EGD) WITH PROPOFOL;  Surgeon: JMilus Banister MD;  Location: [facility];  Service: Endoscopy;  Laterality: N/A;  . EXCISIONAL HEMORRHOIDECTOMY    . FINE NEEDLE ASPIRATION  08/25/2018   Procedure: FINE NEEDLE ASPIRATION;  Surgeon: IGarner Nash DO;  [facility];  Service: Cardiopulmonary;;  . FLEXIBLE BRONCHOSCOPY  08/25/2018   Procedure: FLEXIBLE BRONCHOSCOPY;  Surgeon: IGarner Nash DO;  [facility];  Service: Cardiopulmonary;;  . IR GASTROSTOMY TUBE MOD SED  12/11/2018  . IR GASTROSTOMY TUBE REMOVAL  03/22/2019  . IR IMAGING GUIDED PORT INSERTION  09/29/2018  . LEFT HEART CATHETERIZATION WITH CORONARY ANGIOGRAM N/A 10/18/2011   Procedure: LEFT HEART CATHETERIZATION WITH CORONARY ANGIOGRAM;  Surgeon: DLeonie Man MD;  [facility];  Service: Cardiovascular;  Laterality: N/A;  . LUMBAR LAMINECTOMY/DECOMPRESSION MICRODISCECTOMY  03/31/2012   Procedure: LUMBAR LAMINECTOMY/DECOMPRESSION MICRODISCECTOMY 1 LEVEL;  Surgeon: HCharlie Pitter MD;  [facility];  Service: Neurosurgery;  Laterality: Left;  . TRANSTHORACIC ECHOCARDIOGRAM  07/28/2011   EF 55-65%; LVH, grade 1 diastolic dysfunction;  Home Medications    Prior to Admission medications   Medication Sig Start Date End Date Taking? Authorizing Provider  ACCU-CHEK FASTCLIX LANCETS MISC Use as directed 01/02/19   [name], MD  albuterol (VENTOLIN HFA) 108 (90 Base) MCG/ACT inhaler Inhale 1 puff into the lungs every 6 (six) hours as needed for wheezing or shortness of breath. 03/20/19   [name], MD  aspirin 81 MG tablet Take 1 tablet (81 mg total) daily by mouth. 10/10/17   [name], MD  Cholecalciferol (VITAMIN D) 2000 units tablet Take 1 tablet (2,000 Units total) by mouth daily. 08/04/18   Jamse Arn, MD  clopidogrel (PLAVIX) 75 MG tablet Take 1  tablet (75 mg total) by mouth daily. YOU MAY RESTART 08/26/2018 03/19/19   Hilty, Nadean Corwin, MD  diclofenac sodium (VOLTAREN) 1 % GEL Apply 2 g topically 4 (four) times daily. 04/20/18   [name], MD  dronabinol (MARINOL) 5 MG capsule Take 1 capsule (5 mg total) by mouth 2 (two) times daily before a meal. 10/18/18   Tyler Pita, MD  DULoxetine (CYMBALTA) 60 MG capsule Take 1 capsule (60 mg total) by mouth daily. 03/20/19   [name], MD  Fluticasone-Salmeterol (ADVAIR) 100-50 MCG/DOSE AEPB Inhale 1 puff into the lungs 2 (two) times daily. 03/20/19   [name], MD  glucose blood (ACCU-CHEK GUIDE) test strip Use as instructed 01/02/19   [name], MD  HYDROcodone-acetaminophen (NORCO/VICODIN) 5-325 MG tablet Take 1 tablet by mouth every 4 (four) hours as needed. 03/04/19   Caccavale, Sophia, PA-C  LORazepam (ATIVAN) 0.5 MG tablet 1 tab po q 4-6 hours prn or 1 tab po 30 minutes prior to radiation 08/30/18   Hayden Pedro, PA-C  mirtazapine (REMERON) 30 MG tablet Take 1 tablet (30 mg total) by mouth at bedtime. 08/31/18   Curt Bears, MD  nebivolol (BYSTOLIC) 5 MG tablet Take 1 tablet (5 mg total) by mouth daily. 03/19/19   Hilty, Nadean Corwin, MD  nitroGLYCERIN (NITROSTAT) 0.4 MG SL tablet Place 1 tablet (0.4 mg total) under the tongue every 5 (five) minutes as needed for chest pain. 03/19/19   Hilty, Nadean Corwin, MD  ondansetron (ZOFRAN ODT) 4 MG disintegrating tablet Take 1 tablet (4 mg total) by mouth every 8 (eight) hours as needed for nausea or vomiting. 02/19/19   Tegeler, Gwenyth Allegra, MD  pantoprazole (PROTONIX) 40 MG tablet Take 1 tablet (40 mg total) by mouth daily. 03/20/19   [name], MD  prochlorperazine (COMPAZINE) 10 MG tablet Take 1 tablet (10 mg total) by mouth every 6 (six) hours as needed for nausea or vomiting. 10/23/18   Curt Bears, MD  promethazine (PHENERGAN) 25 MG suppository Place 1 suppository (25 mg total) rectally every 6 (six) hours as  needed for nausea or vomiting. Patient not taking: Reported on 03/20/2019 03/04/19   Caccavale, Sophia, PA-C  ranolazine (RANEXA) 1000 MG SR tablet Take 1 tablet (1,000 mg total) by mouth 2 (two) times daily. 05/24/17   Hilty, Nadean Corwin, MD  rosuvastatin (CRESTOR) 20 MG tablet Take 1 tablet (20 mg total) by mouth daily. 03/20/19   [name], MD  Sterile Water LIQD Take 30 Bottles by mouth daily. 12/28/18   Kayleen Memos, DO  sucralfate (CARAFATE) 1 g tablet Take 1 tablet by mouth 4 (four) times daily -  before meals and at bedtime. 09/23/18   [provider]  tiotropium (SPIRIVA HANDIHALER) 18 MCG inhalation capsule Place 1 capsule (18 mcg total) into inhaler  and inhale daily. 03/20/19   [name], MD  vitamin B-12 (CYANOCOBALAMIN) 500 MCG tablet Take 1 tablet (500 mcg total) by mouth daily. 06/30/18   Jamse Arn, MD  zolpidem (AMBIEN) 5 MG tablet Take 1 tablet (5 mg total) by mouth at bedtime. 03/20/19   [name], MD    Family History Family History  Problem Relation Age of Onset  . Heart attack Father   . Hypertension Mother   . Diabetes Mother   . Heart disease Brother        x 3   . Heart attack Brother        deceased  . Hypertension Sister   . Diabetes Sister   . Anesthesia problems Neg Hx   . Hypotension Neg Hx   . Malignant hyperthermia Neg Hx   . Pseudochol deficiency Neg Hx     Social History Social History   Tobacco Use  . Smoking status: Former Smoker    Packs/day: 0.25    Years: 25.00    Pack years: 6.25    Types: Cigarettes    Last attempt to quit: 05/24/2015    Years since quitting: 3.8  . Smokeless tobacco: Never Used  Substance Use Topics  . Alcohol use: No    Alcohol/week: 0.0 standard drinks  . Drug use: No     Allergies   Iohexol   Review of Systems Review of Systems  Gastrointestinal: Positive for vomiting.  All other systems reviewed and are negative.    Physical Exam Updated Vital Signs BP 123/90 (BP Location:  Left Arm)   Pulse 92   Temp 99.2 F (37.3 C) (Oral)   Resp 18   Ht _0  (1.753 m)   Wt 81 kg   SpO2 98%   BMI 26.37 kg/m   Physical Exam Vitals signs and nursing note reviewed.  Constitutional:      General: He is not in acute distress.    Appearance: He is well-developed.  HENT:     Head: Atraumatic.  Eyes:     Conjunctiva/sclera: Conjunctivae normal.  Neck:     Musculoskeletal: Neck supple.  Cardiovascular:     Rate and Rhythm: Normal rate and regular rhythm.     Pulses: Normal pulses.     Heart sounds: Normal heart sounds.  Pulmonary:     Effort: Pulmonary effort is normal.     Breath sounds: Normal breath sounds. No wheezing, rhonchi or rales.  Abdominal:     General: There is no distension.     Palpations: Abdomen is soft.     Tenderness: There is no abdominal tenderness.  Skin:    Capillary Refill: Capillary refill takes less than 2 seconds.     Findings: No rash.  Neurological:     Mental Status: He is alert and oriented to person, place, and time.     GCS: GCS eye subscore is 4. GCS verbal subscore is 5. GCS motor subscore is 6.     Cranial Nerves: Cranial nerves are intact.     Sensory: Sensation is intact.     Motor: Motor function is intact.     Coordination: Coordination is intact.  Psychiatric:        Mood and Affect: Mood normal.      ED Treatments / Results  Labs (all labs ordered are listed, but only abnormal results are displayed) Labs Reviewed  CBC WITH DIFFERENTIAL/PLATELET  COMPREHENSIVE METABOLIC PANEL  LIPASE, BLOOD  URINALYSIS, ROUTINE W REFLEX MICROSCOPIC  EKG EKG Interpretation  Date/Time:  _0 :09 PM Patient report having persistent nausea and vomiting for the past 3 to 4 days.  History of lung cancer currently receiving chemo and radiation.  Does complain of persistent headache which is not new.  He does not have any focal neuro deficit on exam.  Abdomen is soft nontender.   Work-up initiated.  11:00 PM Acute abdominal series unremarkable.  No obvious sign so suggest SBO. Will obtain head CT for further evaluation given hx of lung cancer.  WIll continue with sxs control.  Pt sign out to Leanora Ivanoff, PA-C who will f/u on labs and determine dispo.    Domenic Moras, PA-C 04/01/19 2340    Virgel Manifold, MD 04/02/19 [id number]

## 2019-04-01 NOTE — ED Notes (Signed)
Pt transported to CT ?

## 2019-04-01 NOTE — ED Provider Notes (Signed)
53 year old male received at signout from [place] pending labs and imaging. Per his HPI:   "The history is provided by the patient and medical records. No language interpreter was used.  Emesis     53 year old male with history of lung cancer, CAD, COPD, GERD, diabetes presenting for evaluation of nausea and vomiting.  Patient report for the past 3 to 4 days he has had persistent nausea, unable to keep anything down, and feeling fatigue.  He also complaining of persistent headache, pain in his chest and occasional nonproductive cough.  Denies any associated fever chills, no dysuria, able to pass flatus and no change in bowel movement.  He reports last chemo treatment was 3 weeks ago.  He does not complain of any abdominal pain."   Patient reports he has been out of his home pain medication for the last 1-1.5 weeks.  Not have a follow-up oncology appointment until next week.  Physical Exam  BP 120/89   Pulse 84   Temp 99.2 F (37.3 C) (Oral)   Resp (!) 23   Ht 5\' 9"  (1.753 m)   Wt 81 kg   SpO2 99%   BMI 26.37 kg/m   Physical Exam Vitals signs and nursing note reviewed.  Constitutional:      General: He is not in acute distress.    Appearance: He is well-developed. He is not ill-appearing, toxic-appearing or diaphoretic.  HENT:     Head: Normocephalic.  Eyes:     Conjunctiva/sclera: Conjunctivae normal.  Neck:     Musculoskeletal: Neck supple.  Cardiovascular:     Rate and Rhythm: Normal rate and regular rhythm.     Pulses: Normal pulses.     Heart sounds: Normal heart sounds. No murmur. No friction rub. No gallop.   Pulmonary:     Effort: Pulmonary effort is normal. No respiratory distress.     Breath sounds: No stridor. No wheezing, rhonchi or rales.     Comments: Percussion is dull to the right lower quadrant.  Lungs are clear to auscultation bilaterally.  No reproducible tenderness to palpation to the bilateral chest wall.  No crepitus or step-off to the bilateral  ribs. Chest:     Chest wall: No tenderness.  Abdominal:     General: There is no distension.     Palpations: Abdomen is soft. There is no mass.     Tenderness: There is no abdominal tenderness. There is no right CVA tenderness, left CVA tenderness, guarding or rebound.     Hernia: No hernia is present.  Skin:    General: Skin is warm and dry.  Neurological:     Mental Status: He is alert.     Coordination: Abnormal coordination:   Psychiatric:        Behavior: Behavior normal.     ED Course/Procedures     Procedures  MDM    53 year old male with history of lung cancer, CAD, COPD, GERD, diabetes received a signout from [facility] [place] ending labs and imaging.  He presented for nausea and vomiting and some pain in the right side of his chest.  At noon is 1.73, improved from previous.  UA is minimally concerning for infection, the patient is having no urinary symptoms.  Hemoglobin is 9.9, improved from previous.  Head CT was negative.  Abdominal chest x-ray with mild right basilar atelectasis.  There is also mild retained fecal material, but no evidence of obstructive pattern.  Patient was seen and evaluated along with Dr. Kathrynn Humble, attending  physician.  Of note, the patient reports that he has been out of his home pain medication for the last week and a half.  It is possible that the patient could be having nausea and vomiting secondary to withdrawal.  He has been seen in the ER with similar complaints 2 previous times in addition to today's visit after the last few months.  He recently underwent CT chest.  There is no evidence of pleural effusion on chest x-ray.  At this time, I feel that no further urgent or emergent imaging is warranted.  We will discharge the patient home with Phenergan.  He was successfully fluid challenge in the ER.  We will discharge the patient with a short course of pain medication to bridge the patient until he can contact his oncologist. A 35-month prescription history  query was performed using the [facility] prior to discharge.  Suspect chest pain is secondary to known right-sided lung cancer.  At this time, low suspicion for ACS, PE, pneumonia, COVID-19, aortic dissection, pericarditis.  All questions have been answered.  Safe for discharge home with outpatient follow-up at this time.        Joline Maxcy A, PA-C 04/02/19 [id number]    Varney Biles, MD 04/02/19 2328

## 2019-04-01 NOTE — ED Notes (Signed)
Pt asked to provide urine specimen but pt unable to at this time. Pt has urinal at bedside.

## 2019-04-01 NOTE — ED Notes (Signed)
Pt wife request provider call her with info abt pt when available bc he will not be able to remember the information given to him. Macario Carls [phone]

## 2019-04-02 ENCOUNTER — Telehealth: Payer: Self-pay | Admitting: Medical Oncology

## 2019-04-02 ENCOUNTER — Telehealth: Payer: Self-pay | Admitting: Family Medicine

## 2019-04-02 DIAGNOSIS — R112 Nausea with vomiting, unspecified: Secondary | ICD-10-CM | POA: Diagnosis not present

## 2019-04-02 LAB — URINALYSIS, ROUTINE W REFLEX MICROSCOPIC
Bilirubin Urine: NEGATIVE
Glucose, UA: NEGATIVE mg/dL
Hgb urine dipstick: NEGATIVE
Ketones, ur: NEGATIVE mg/dL
Nitrite: NEGATIVE
Protein, ur: NEGATIVE mg/dL
Specific Gravity, Urine: 1.015 (ref 1.005–1.030)
pH: 5 (ref 5.0–8.0)

## 2019-04-02 LAB — CBC WITH DIFFERENTIAL/PLATELET
Abs Immature Granulocytes: 0.02 10*3/uL (ref 0.00–0.07)
Basophils Absolute: 0 10*3/uL (ref 0.0–0.1)
Basophils Relative: 0 %
Eosinophils Absolute: 0.1 10*3/uL (ref 0.0–0.5)
Eosinophils Relative: 2 %
HCT: 31.3 % — ABNORMAL LOW (ref 39.0–52.0)
Hemoglobin: 9.9 g/dL — ABNORMAL LOW (ref 13.0–17.0)
Immature Granulocytes: 0 %
Lymphocytes Relative: 10 %
Lymphs Abs: 0.6 10*3/uL — ABNORMAL LOW (ref 0.7–4.0)
MCH: 29 pg (ref 26.0–34.0)
MCHC: 31.6 g/dL (ref 30.0–36.0)
MCV: 91.8 fL (ref 80.0–100.0)
Monocytes Absolute: 0.6 10*3/uL (ref 0.1–1.0)
Monocytes Relative: 11 %
Neutro Abs: 4.7 10*3/uL (ref 1.7–7.7)
Neutrophils Relative %: 77 %
Platelets: 212 10*3/uL (ref 150–400)
RBC: 3.41 MIL/uL — ABNORMAL LOW (ref 4.22–5.81)
RDW: 12.4 % (ref 11.5–15.5)
WBC: 6 10*3/uL (ref 4.0–10.5)
nRBC: 0 % (ref 0.0–0.2)

## 2019-04-02 LAB — COMPREHENSIVE METABOLIC PANEL
ALT: 7 U/L (ref 0–44)
AST: 11 U/L — ABNORMAL LOW (ref 15–41)
Albumin: 3.4 g/dL — ABNORMAL LOW (ref 3.5–5.0)
Alkaline Phosphatase: 61 U/L (ref 38–126)
Anion gap: 10 (ref 5–15)
BUN: 16 mg/dL (ref 6–20)
CO2: 29 mmol/L (ref 22–32)
Calcium: 9.2 mg/dL (ref 8.9–10.3)
Chloride: 104 mmol/L (ref 98–111)
Creatinine, Ser: 1.75 mg/dL — ABNORMAL HIGH (ref 0.61–1.24)
GFR calc Af Amer: 51 mL/min — ABNORMAL LOW (ref >60)
GFR calc non Af Amer: 44 mL/min — ABNORMAL LOW (ref >60)
Glucose, Bld: 84 mg/dL (ref 70–99)
Potassium: 4 mmol/L (ref 3.5–5.1)
Sodium: 143 mmol/L (ref 135–145)
Total Bilirubin: 0.5 mg/dL (ref 0.3–1.2)
Total Protein: 7.2 g/dL (ref 6.5–8.1)

## 2019-04-02 LAB — LIPASE, BLOOD: Lipase: 29 U/L (ref 11–51)

## 2019-04-02 MED ORDER — SODIUM CHLORIDE 0.9 % IV BOLUS
1000.0000 mL | Freq: Once | INTRAVENOUS | Status: AC
  Administered 2019-04-02: 03:00:00 1000 mL via INTRAVENOUS

## 2019-04-02 MED ORDER — HYDROCODONE-ACETAMINOPHEN 5-325 MG PO TABS: 1.0000 | ORAL_TABLET | Freq: Four times a day (QID) | ORAL | 0 refills | Status: DC | PRN

## 2019-04-02 MED ORDER — PROMETHAZINE HCL 25 MG RE SUPP: 25.0000 mg | Freq: Four times a day (QID) | RECTAL | 0 refills | Status: DC | PRN

## 2019-04-02 MED ORDER — HYDROMORPHONE HCL 1 MG/ML IJ SOLN
1.0000 mg | Freq: Once | INTRAMUSCULAR | Status: AC
  Administered 2019-04-02: 1 mg via INTRAVENOUS
  Filled 2019-04-02: qty 1

## 2019-04-02 MED ORDER — PROMETHAZINE HCL 25 MG PO TABS: 25.0000 mg | ORAL_TABLET | Freq: Four times a day (QID) | ORAL | 0 refills | Status: DC | PRN

## 2019-04-02 MED FILL — HYDROCODON-APAP 5-325: 5-325 | 2 days supply | Qty: 8 | Fill #0

## 2019-04-02 MED FILL — PROMETHAZINE 25 MG TABLET: 25 | 7 days supply | Qty: 30 | Fill #0

## 2019-04-02 NOTE — Telephone Encounter (Signed)
The other alternative to Cymbalta we have for his neuropathic pain is gabapentin and he had refused this at his last visit.

## 2019-04-02 NOTE — ED Notes (Signed)
Pt standing at bed side and attempting urine sample. Pt requested for staff to step out while using BR

## 2019-04-02 NOTE — ED Notes (Signed)
Pt given gingerale for PO challenge 

## 2019-04-02 NOTE — ED Notes (Signed)
Pt. Made aware for the need of urine specimen. 

## 2019-04-02 NOTE — Telephone Encounter (Signed)
New Message   Pt states the medication he was prescribed for his leg pain is not helping and would like to know if he could get an alternative. Please call

## 2019-04-02 NOTE — Discharge Instructions (Signed)
Thank you for allowing me to care for you today in the Emergency Department.   Please call your oncologist to get a refill of your pain medication.  If you call, you should ask to speak to his nurse.  You can take 1 tablet of Phenergan by mouth once every 6 hours as needed for nausea or vomiting.  If you are unable to keep the medication down by mouth due to vomiting, you can place 1 suppository in the rectum once every 6 hours as needed for nausea or vomiting.  You should not use these medications at the same time-only one or the other.  Continue to drink plenty of fluids to avoid dehydration.  Follow-up with Dr. Julien Nordmann if you continue to have some pain on the right side of your chest.  Return to the emergency department if you develop new or worsening symptoms including persistent vomiting, severe shortness of breath, high fevers, or other new, concerning symptoms.

## 2019-04-02 NOTE — ED Notes (Addendum)
DG Abdomen Acute W/Chest documented in error.

## 2019-04-02 NOTE — Telephone Encounter (Signed)
Will route to PCP for review. 

## 2019-04-02 NOTE — ED Notes (Signed)
Pt asked again for urine specimen but unable to void at this time. Pt has urinal at bedside.

## 2019-04-03 LAB — URINE CULTURE: Culture: <10000 — AB

## 2019-04-03 NOTE — Telephone Encounter (Signed)
I told pt per Dr Julien Nordmann he can take Ibuprofen for his pain.

## 2019-04-04 NOTE — Telephone Encounter (Signed)
Patient was called and informed of PCP orders. Patient states that he will "just deal with the pain".

## 2019-04-11 ENCOUNTER — Telehealth: Payer: Self-pay | Admitting: *Deleted

## 2019-04-11 NOTE — Telephone Encounter (Signed)
Received call from patient requesting a chest CT scan. He states he is having right sided chest pain (ongoing) where he had lung surgery and radiation. He also says he feels dizzy at times and has a cough and SOB. Reminded pt that he had a CT scan in April and insurance will not approve another one this soon. Spoke with Dr. Julien Nordmann and he recommended that pt be evaluated for these symptoms in the ED.  TC back to patient and advised him that he should be evaluated in the ED for the above symptoms. Pt stated he would go to ED

## 2019-04-13 ENCOUNTER — Other Ambulatory Visit: Payer: Self-pay

## 2019-04-13 ENCOUNTER — Emergency Department (HOSPITAL_COMMUNITY)
Admission: EM | Admit: 2019-04-13 | Discharge: 2019-04-13 | Disposition: A | Discharge status: Home / Self Care | Payer: Medicare Other | Attending: Emergency Medicine | Admitting: Emergency Medicine

## 2019-04-13 ENCOUNTER — Emergency Department (HOSPITAL_COMMUNITY): Payer: Medicare Other

## 2019-04-13 ENCOUNTER — Encounter (HOSPITAL_COMMUNITY): Payer: Self-pay | Admitting: Emergency Medicine

## 2019-04-13 DIAGNOSIS — R079 Chest pain, unspecified: Secondary | ICD-10-CM | POA: Diagnosis not present

## 2019-04-13 DIAGNOSIS — N183 Chronic kidney disease, stage 3 (moderate): Secondary | ICD-10-CM | POA: Insufficient documentation

## 2019-04-13 DIAGNOSIS — Z87891 Personal history of nicotine dependence: Secondary | ICD-10-CM | POA: Insufficient documentation

## 2019-04-13 DIAGNOSIS — R112 Nausea with vomiting, unspecified: Secondary | ICD-10-CM | POA: Diagnosis not present

## 2019-04-13 DIAGNOSIS — Z7982 Long term (current) use of aspirin: Secondary | ICD-10-CM | POA: Insufficient documentation

## 2019-04-13 DIAGNOSIS — R0789 Other chest pain: Secondary | ICD-10-CM

## 2019-04-13 DIAGNOSIS — Z79899 Other long term (current) drug therapy: Secondary | ICD-10-CM | POA: Diagnosis not present

## 2019-04-13 DIAGNOSIS — E1122 Type 2 diabetes mellitus with diabetic chronic kidney disease: Secondary | ICD-10-CM | POA: Insufficient documentation

## 2019-04-13 DIAGNOSIS — R55 Syncope and collapse: Secondary | ICD-10-CM | POA: Diagnosis not present

## 2019-04-13 DIAGNOSIS — I129 Hypertensive chronic kidney disease with stage 1 through stage 4 chronic kidney disease, or unspecified chronic kidney disease: Secondary | ICD-10-CM | POA: Insufficient documentation

## 2019-04-13 LAB — CBC
HCT: 30 % — ABNORMAL LOW (ref 39.0–52.0)
Hemoglobin: 9.8 g/dL — ABNORMAL LOW (ref 13.0–17.0)
MCH: 29.3 pg (ref 26.0–34.0)
MCHC: 32.7 g/dL (ref 30.0–36.0)
MCV: 89.8 fL (ref 80.0–100.0)
Platelets: 213 10*3/uL (ref 150–400)
RBC: 3.34 MIL/uL — ABNORMAL LOW (ref 4.22–5.81)
RDW: 12.5 % (ref 11.5–15.5)
WBC: 5.8 10*3/uL (ref 4.0–10.5)
nRBC: 0 % (ref 0.0–0.2)

## 2019-04-13 LAB — BASIC METABOLIC PANEL
Anion gap: 10 (ref 5–15)
BUN: 17 mg/dL (ref 6–20)
CO2: 26 mmol/L (ref 22–32)
Calcium: 9.1 mg/dL (ref 8.9–10.3)
Chloride: 103 mmol/L (ref 98–111)
Creatinine, Ser: 1.68 mg/dL — ABNORMAL HIGH (ref 0.61–1.24)
GFR calc Af Amer: 53 mL/min — ABNORMAL LOW (ref >60)
GFR calc non Af Amer: 46 mL/min — ABNORMAL LOW (ref >60)
Glucose, Bld: 75 mg/dL (ref 70–99)
Potassium: 3.5 mmol/L (ref 3.5–5.1)
Sodium: 139 mmol/L (ref 135–145)

## 2019-04-13 LAB — TROPONIN I: Troponin I: <0.03 ng/mL (ref <0.03)

## 2019-04-13 MED ORDER — HYDROMORPHONE HCL 1 MG/ML IJ SOLN
1.0000 mg | Freq: Once | INTRAMUSCULAR | Status: AC
  Administered 2019-04-13: 21:00:00 1 mg via INTRAVENOUS
  Filled 2019-04-13: qty 1

## 2019-04-13 MED ORDER — ONDANSETRON HCL 4 MG/2ML IJ SOLN
4.0000 mg | Freq: Once | INTRAMUSCULAR | Status: AC
  Administered 2019-04-13: 4 mg via INTRAVENOUS
  Filled 2019-04-13: qty 2

## 2019-04-13 MED ORDER — SODIUM CHLORIDE 0.9% FLUSH: 3.0000 mL | Freq: Once | INTRAVENOUS | Status: DC

## 2019-04-13 MED ORDER — SODIUM CHLORIDE 0.9 % IV BOLUS
1000.0000 mL | Freq: Once | INTRAVENOUS | Status: AC
  Administered 2019-04-13: 1000 mL via INTRAVENOUS

## 2019-04-13 NOTE — ED Provider Notes (Signed)
[place] DEPT Provider Note   CSN: [id number] Arrival date & time: 04/13/19  2003    History   Chief Complaint Chief Complaint  Patient presents with  . Chest Pain  . Near Syncope  . Nausea  . Emesis    HPI Andrew West is a 53 y.o. male.     HPI  52 year old male presents with right-sided chest pain.  Is been ongoing for about 3 or 4 days.  Has also had vomiting and has vomited at least 3 or 4 times today.  No blood.  No abdominal pain.  Some back pain but mostly its anterior chest pain.  It is not pleuritic.  Nothing makes it better or worse.  Sharp and dull.  No fevers.  He has a chronic cough that is unchanged and a little bit of on and off shortness of breath.  Has had this pain multiple times before and has been told is probably from his right-sided lung cancer.  Last chemotherapy was a couple months ago.  He states that he used to be on hydrocodone but has not been for a couple weeks.  Tried to call his doctor for refill but could not get through.  Past Medical History:  Diagnosis Date  . Anxiety   . CAD (coronary artery disease)    a. s/p multiple PCIs with last cath 11/2016 with severe multivessel CAD, s/p PCTA to LCx but unable to pass stent  . Cancer ([place])   . Chronic leg pain    bilateral  . Chronic lower back pain   . COPD (chronic obstructive pulmonary disease) ([place])   . Depression   . GERD (gastroesophageal reflux disease)    Takes Dexilant  . HLD (hyperlipidemia)   . Hypertension   . Rhabdomyolysis    h/o, r/t statins  . Sleep apnea    "can't tolerate mask" (12/16/2016)  . Type II diabetes mellitus [facility])     Patient Active Problem List   Diagnosis Date Noted  . Protein-calorie malnutrition, severe 12/26/2018  . Symptomatic anemia 12/25/2018  . Syncope 12/25/2018  . CKD (chronic kidney disease) stage 3, GFR 30-59 ml/min (HCC) 12/09/2018  . Radiation esophagitis   . Malnutrition of moderate degree 12/06/2018  .  Esophageal stricture   . N&V (nausea and vomiting) 12/01/2018  . Antineoplastic chemotherapy induced pancytopenia ([place]) 12/01/2018  . Odynophagia   . Dysphagia 10/08/2018  . Hypokalemia 10/08/2018  . Hypomagnesemia 10/08/2018  . Type 2 diabetes mellitus ([place]) 10/08/2018  . Anemia 10/08/2018  . Sleep apnea 10/08/2018  . 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 Goals of care, counseling/discussion 08/31/2018  . Encounter for antineoplastic chemotherapy 08/31/2018  . Encounter for smoking cessation counseling 08/31/2018  . Malignant neoplasm of bronchus of right upper lobe ([place]) 08/29/2018  . Mediastinal mass 08/17/2018  . Vitamin D deficiency 08/04/2018  . Diabetic peripheral neuropathy ([place]) 06/30/2018  . Substernal chest pain 02/25/2018  . Benign prostatic hyperplasia with urinary hesitancy 11/17/2017  . Preoperative cardiovascular examination 07/12/2017  . Refractory angina ([place]) 05/24/2017  . Complex regional pain syndrome type I 03/24/2017  . Progressive angina ([place]) -Class III 12/16/2016  . Anxiety 06/28/2016  . Diastolic dysfunction-grade 2 with EF 60-65% Oct 2016 03/22/2016  . Hypertension 10/03/2015  . CAD S/P multiple PCI's 10/03/2015  . Pulmonary nodule 06/24/2015  . Cough 06/24/2015  . COPD with asthma ([place]) 06/24/2015  . Reactive airway disease 06/04/2015  . DOE (dyspnea  on exertion) 04/15/2015  . Lumbar disc herniation with radiculopathy 03/31/2012  . Presence of stent in left circumflex coronary artery 10/18/2011    Class: History of  . TOBACCO ABUSE 02/27/2009  . ABDOMINAL PAIN, LEFT LOWER QUADRANT 12/30/2008  . ABDOMINAL PAIN, EPIGASTRIC 12/05/2008  . Anxiety and depression 09/05/2008  . Hereditary and idiopathic peripheral neuropathy 09/05/2008  . GERD 09/05/2008  . Cervical disc disorder with radiculopathy of cervical region 08/19/2008  . HLD (hyperlipidemia) 04/25/2008    Class: Diagnosis of  . ALLERGIC RHINITIS 04/25/2008  .  Backache 04/25/2008  . Chest pain, unspecified 04/25/2008  . COLONIC POLYPS, HX OF 04/25/2008  . Obesity 04/18/2008    Class: Diagnosis of  . ANAL FISSURE, HX OF 04/18/2008  . Diabetes mellitus ([place]) 01/30/2008    Class: History of  . RECTAL BLEEDING 01/30/2008    Past Surgical History:  Procedure Laterality Date  . BACK SURGERY    . BRONCHIAL BIOPSY  08/25/2018   Procedure: BRONCHIAL BIOPSIES;  Surgeon: Garner Nash, DO;  Location: [facility];  Service: Cardiopulmonary;;  . CARDIAC CATHETERIZATION N/A 09/25/2015   Procedure: Left Heart Cath and Coronary Angiography;  Surgeon: Leonie Man, MD;  Location: [place] CV LAB;  Service: Cardiovascular;  Laterality: N/A;  . CARDIAC CATHETERIZATION N/A 12/16/2016   Procedure: Left Heart Cath and Coronary Angiography;  Surgeon: Leonie Man, MD;  Location: [place] CV LAB;  Service: Cardiovascular;  Laterality: N/A;  . CARDIAC CATHETERIZATION N/A 12/16/2016   Procedure: Coronary Balloon Angioplasty;  Surgeon: Leonie Man, MD;  Location: [place] CV LAB;  Service: Cardiovascular;  Laterality: N/A;  . COLONOSCOPY W/ POLYPECTOMY    . CORONARY ANGIOPLASTY  09/25/2015   mid cir & om  . CORONARY ANGIOPLASTY WITH STENT PLACEMENT  10/09/2001   PTCA & stenting of mid AV circumflex; 2.5x101m Pixel stent  . CORONARY ANGIOPLASTY WITH STENT PLACEMENT  12/13/2001   PCI with stent to mid L circumflex, 95% stenosis to 0% residual  . CORONARY ANGIOPLASTY WITH STENT PLACEMENT  10/10/2003   PCI to mid AV circumflex; LAD 30% disease; RCA 100% occluded prox.  . CORONARY ANGIOPLASTY WITH STENT PLACEMENT  09/01/2011   PCI with stenting with bare metal stent to mid AV groove circumflex and PDA  . CORONARY ANGIOPLASTY WITH STENT PLACEMENT  10/17/2011   cutting balloon angioplasty of ostial lateral OM1 branch and bifurcation AV groove circumflex OM junction; stenosis reduced to 0%  . ENDOBRONCHIAL ULTRASOUND Bilateral 08/25/2018   Procedure:  ENDOBRONCHIAL ULTRASOUND;  Surgeon: IGarner Nash DO;  [facility];  Service: Cardiopulmonary;  Laterality: Bilateral;  . ESOPHAGOGASTRODUODENOSCOPY (EGD) WITH PROPOFOL N/A 12/08/2018   Procedure: ESOPHAGOGASTRODUODENOSCOPY (EGD) WITH PROPOFOL;  Surgeon: JMilus Banister MD;  Location[facility];  Service: Endoscopy;  Laterality: N/A;  . EXCISIONAL HEMORRHOIDECTOMY    . FINE NEEDLE ASPIRATION  08/25/2018   Procedure: FINE NEEDLE ASPIRATION;  Surgeon: IGarner Nash DO;  [facility];  Service: Cardiopulmonary;;  . FLEXIBLE BRONCHOSCOPY  08/25/2018   Procedure: FLEXIBLE BRONCHOSCOPY;  Surgeon: IGarner Nash DO;  [facility];  Service: Cardiopulmonary;;  . IR GASTROSTOMY TUBE MOD SED  12/11/2018  . IR GASTROSTOMY TUBE REMOVAL  03/22/2019  . IR IMAGING GUIDED PORT INSERTION  09/29/2018  . LEFT HEART CATHETERIZATION WITH CORONARY ANGIOGRAM N/A 10/18/2011   Procedure: LEFT HEART CATHETERIZATION WITH CORONARY ANGIOGRAM;  Surgeon: DLeonie Man MD;  [facility];  Service: Cardiovascular;  Laterality: N/A;  .  LUMBAR LAMINECTOMY/DECOMPRESSION MICRODISCECTOMY  03/31/2012   Procedure: LUMBAR LAMINECTOMY/DECOMPRESSION MICRODISCECTOMY 1 LEVEL;  Surgeon: Charlie Pitter, MD;  Location: [place] NEURO ORS;  Service: Neurosurgery;  Laterality: Left;  . TRANSTHORACIC ECHOCARDIOGRAM  07/28/2011   EF 55-65%; LVH, grade 1 diastolic dysfunction;         Home Medications    Prior to Admission medications   Medication Sig Start Date End Date Taking? Authorizing Provider  ACCU-CHEK FASTCLIX LANCETS MISC Use as directed 01/02/19   [name], MD  acetaminophen (TYLENOL) 500 MG tablet Take 1,000 mg by mouth every 6 (six) hours as needed for moderate pain.    [provider]  albuterol (VENTOLIN HFA) 108 (90 Base) MCG/ACT inhaler Inhale 1 puff into the lungs every 6 (six) hours as needed for wheezing or shortness of breath. 03/20/19   [name], MD   aspirin 81 MG tablet Take 1 tablet (81 mg total) daily by mouth. 10/10/17   [name], MD  Cholecalciferol (VITAMIN D) 2000 units tablet Take 1 tablet (2,000 Units total) by mouth daily. 08/04/18   Jamse Arn, MD  clopidogrel (PLAVIX) 75 MG tablet Take 1 tablet (75 mg total) by mouth daily. YOU MAY RESTART 08/26/2018 03/19/19   Hilty, Nadean Corwin, MD  diclofenac sodium (VOLTAREN) 1 % GEL Apply 2 g topically 4 (four) times daily. 04/20/18   [name], MD  dronabinol (MARINOL) 5 MG capsule Take 1 capsule (5 mg total) by mouth 2 (two) times daily before a meal. 10/18/18   Tyler Pita, MD  DULoxetine (CYMBALTA) 60 MG capsule Take 1 capsule (60 mg total) by mouth daily. 03/20/19   [name], MD  Fluticasone-Salmeterol (ADVAIR) 100-50 MCG/DOSE AEPB Inhale 1 puff into the lungs 2 (two) times daily. 03/20/19   [name], MD  glucose blood (ACCU-CHEK GUIDE) test strip Use as instructed 01/02/19   [name], MD  HYDROcodone-acetaminophen (NORCO/VICODIN) 5-325 MG tablet Take 1 tablet by mouth every 6 (six) hours as needed. 04/02/19   McDonald, Mia A, PA-C  LORazepam (ATIVAN) 0.5 MG tablet 1 tab po q 4-6 hours prn or 1 tab po 30 minutes prior to radiation Patient taking differently: Take 0.5 mg by mouth every 6 (six) hours as needed for anxiety or sedation. 1 tab po q 4-6 hours prn or 1 tab po 30 minutes prior to radiation 08/30/18   Hayden Pedro, PA-C  mirtazapine (REMERON) 30 MG tablet Take 1 tablet (30 mg total) by mouth at bedtime. 08/31/18   Curt Bears, MD  nebivolol (BYSTOLIC) 5 MG tablet Take 1 tablet (5 mg total) by mouth daily. 03/19/19   Hilty, Nadean Corwin, MD  nitroGLYCERIN (NITROSTAT) 0.4 MG SL tablet Place 1 tablet (0.4 mg total) under the tongue every 5 (five) minutes as needed for chest pain. 03/19/19   Hilty, Nadean Corwin, MD  ondansetron (ZOFRAN ODT) 4 MG disintegrating tablet Take 1 tablet (4 mg total) by mouth every 8 (eight) hours as needed for nausea or  vomiting. 02/19/19   Tegeler, Gwenyth Allegra, MD  pantoprazole (PROTONIX) 40 MG tablet Take 1 tablet (40 mg total) by mouth daily. 03/20/19   [name], MD  prochlorperazine (COMPAZINE) 10 MG tablet Take 1 tablet (10 mg total) by mouth every 6 (six) hours as needed for nausea or vomiting. 10/23/18   Curt Bears, MD  promethazine (PHENERGAN) 25 MG suppository Place 1 suppository (25 mg total) rectally every 6 (six) hours as needed for nausea or vomiting. 04/02/19   McDonald, Mia A,  PA-C  promethazine (PHENERGAN) 25 MG tablet Take 1 tablet (25 mg total) by mouth every 6 (six) hours as needed for nausea or vomiting. 04/02/19   McDonald, Mia A, PA-C  ranolazine (RANEXA) 1000 MG SR tablet Take 1 tablet (1,000 mg total) by mouth 2 (two) times daily. 05/24/17   Hilty, Nadean Corwin, MD  rosuvastatin (CRESTOR) 20 MG tablet Take 1 tablet (20 mg total) by mouth daily. 03/20/19   [name], MD  Sterile Water LIQD Take 30 Bottles by mouth daily. Patient not taking: Reported on 04/02/2019 12/28/18   Kayleen Memos, DO  sucralfate (CARAFATE) 1 g tablet Take 1 tablet by mouth 4 (four) times daily -  before meals and at bedtime. 09/23/18   [provider]  tiotropium (SPIRIVA HANDIHALER) 18 MCG inhalation capsule Place 1 capsule (18 mcg total) into inhaler and inhale daily. 03/20/19   [name], MD  vitamin B-12 (CYANOCOBALAMIN) 500 MCG tablet Take 1 tablet (500 mcg total) by mouth daily. 06/30/18   Jamse Arn, MD  zolpidem (AMBIEN) 5 MG tablet Take 1 tablet (5 mg total) by mouth at bedtime. 03/20/19   [name], MD    Family History Family History  Problem Relation Age of Onset  . Heart attack Father   . Hypertension Mother   . Diabetes Mother   . Heart disease Brother        x 3   . Heart attack Brother        deceased  . Hypertension Sister   . Diabetes Sister   . Anesthesia problems Neg Hx   . Hypotension Neg Hx   . Malignant hyperthermia Neg Hx   . Pseudochol deficiency Neg  Hx     Social History Social History   Tobacco Use  . Smoking status: Former Smoker    Packs/day: 0.25    Years: 25.00    Pack years: 6.25    Types: Cigarettes    Last attempt to quit: 05/24/2015    Years since quitting: 3.8  . Smokeless tobacco: Never Used  Substance Use Topics  . Alcohol use: No    Alcohol/week: 0.0 standard drinks  . Drug use: No     Allergies   Iohexol   Review of Systems Review of Systems  Constitutional: Negative for fever.  Respiratory: Positive for cough (cough) and shortness of breath (intermittent).   Cardiovascular: Positive for chest pain.  Gastrointestinal: Positive for vomiting. Negative for abdominal pain.  Musculoskeletal: Positive for back pain.  All other systems reviewed and are negative.    Physical Exam Updated Vital Signs BP 128/80   Pulse 84   Temp 98.5 F (36.9 C) (Oral)   Resp 19   SpO2 100%   Physical Exam Vitals signs and nursing note reviewed.  Constitutional:      Appearance: He is well-developed.  HENT:     Head: Normocephalic and atraumatic.     Right Ear: External ear normal.     Left Ear: External ear normal.     Nose: Nose normal.  Eyes:     General:        Right eye: No discharge.        Left eye: No discharge.  Neck:     Musculoskeletal: Neck supple.  Cardiovascular:     Rate and Rhythm: Normal rate and regular rhythm.     Pulses:          Radial pulses are 2+ on the right side and 2+ on  the left side.     Heart sounds: Normal heart sounds.  Pulmonary:     Effort: Pulmonary effort is normal.     Breath sounds: Normal breath sounds.  Chest:     Chest wall: Tenderness (diffuse right anterior chest wall tenderness) present.  Abdominal:     General: There is no distension.     Palpations: Abdomen is soft.     Tenderness: There is no abdominal tenderness.  Skin:    General: Skin is warm and dry.  Neurological:     Mental Status: He is alert.  Psychiatric:        Mood and Affect: Mood is not  anxious.      ED Treatments / Results  Labs (all labs ordered are listed, but only abnormal results are displayed) Labs Reviewed  BASIC METABOLIC PANEL - Abnormal; Notable for the following components:      Result Value   Creatinine, Ser 1.68 (*)    GFR calc non Af Amer 46 (*)    GFR calc Af Amer 53 (*)    All other components within normal limits  CBC - Abnormal; Notable for the following components:   RBC 3.34 (*)    Hemoglobin 9.8 (*)    HCT 30.0 (*)    All other components within normal limits  TROPONIN I    EKG EKG Interpretation  Date/Time:  Friday Apr 13 2019 20:20:08 EDT Ventricular Rate:  76 PR Interval:    QRS Duration: 110 QT Interval:  407 QTC Calculation: 458 R Axis:   73 Text Interpretation:  Sinus rhythm Consider left atrial enlargement Baseline wander in lead(s) V1 V2 no acute ST/T changes no significant change since Apr 01 2019 Confirmed by Sherwood Gambler [phone]) on 04/13/2019 8:36:39 PM   Radiology Dg Chest 2 View  Result Date: 04/13/2019 CLINICAL DATA:  Right-sided chest pain.  Hypertension. EXAM: CHEST - 2 VIEW COMPARISON:  Apr 01, 2019 chest radiograph and chest CT March 04, 2019 FINDINGS: There is scarring in the right upper lobe toward the apex. There is right base atelectasis. There is no edema or consolidation. The heart size and pulmonary vascularity are normal. No adenopathy. Port-A-Cath tip is in the superior vena cava. No pneumothorax. No bone lesions. IMPRESSION: Areas of scarring and atelectasis on the right. No edema or consolidation. Heart size normal. No evident adenopathy. Port-A-Cath tip in superior vena cava. No evident pneumothorax. Electronically Signed   By: Lowella Grip III M.D.   On: 04/13/2019 20:58    Procedures Procedures (including critical care time)  Medications Ordered in ED Medications  sodium chloride flush (NS) 0.9 % injection 3 mL (has no administration in time range)  HYDROmorphone (DILAUDID) injection 1 mg (1  mg Intravenous Given 04/13/19 2054)  sodium chloride 0.9 % bolus 1,000 mL (0 mLs Intravenous Stopped 04/13/19 2209)  ondansetron (ZOFRAN) injection 4 mg (4 mg Intravenous Given 04/13/19 2054)     Initial Impression / Assessment and Plan / ED Course  I have reviewed the triage vital signs and the nursing notes.  Pertinent labs & imaging results that were available during my care of the patient were reviewed by me and considered in my medical decision making (see chart for details).        Patient's right-sided chest pain is similar to multiple prior episodes.  He has not had any vomiting here.  He feels better after treatment in the ED.  Labs are all stable including chronic kidney disease.  Troponin  is negative.  His vitals are stable including no tachycardia, increased work of breathing or hypoxia.  While he does have cancer, given this is a recurrent issue for him with the stable vitals I think PE is pretty unlikely.  He has significant reproducible chest wall tenderness.  He is asking for a referral to [facility] oncology and I will give him [facility] number.  Otherwise, he appears stable for outpatient work-up.  No infectious signs or symptoms and so I think pneumonia or COVID-19 is pretty unlikely.  Andrew West was evaluated in Emergency Department on 04/13/2019 for the symptoms described in the history of present illness. He was evaluated in the context of the global COVID-19 pandemic, which necessitated consideration that the patient might be at risk for infection with the SARS-CoV-2 virus that causes COVID-19. Institutional protocols and algorithms that pertain to the evaluation of patients at risk for COVID-19 are in a state of rapid change based on information released by regulatory bodies including the CDC and federal and state organizations. These policies and algorithms were followed during the patient's care in the ED.   Final Clinical Impressions(s) / ED Diagnoses   Final  diagnoses:  Chest wall pain    ED Discharge Orders    None       Sherwood Gambler, MD 04/13/19 2211

## 2019-04-13 NOTE — Discharge Instructions (Signed)
If you develop recurrent, continued, or worsening chest pain, shortness of breath, fever, vomiting, abdominal or back pain, or any other new/concerning symptoms then return to the ER for evaluation.  

## 2019-04-13 NOTE — ED Triage Notes (Addendum)
Patient here from home with complaints of right sided chest pain x4 days. Reports chronic pain in which he has been evaluated before. N/v. Chemo/radiation done. 4mg  Zofran given with some relief.

## 2019-04-23 ENCOUNTER — Telehealth: Payer: Self-pay | Admitting: Family Medicine

## 2019-04-23 DIAGNOSIS — M5116 Intervertebral disc disorders with radiculopathy, lumbar region: Secondary | ICD-10-CM

## 2019-04-23 DIAGNOSIS — C3411 Malignant neoplasm of upper lobe, right bronchus or lung: Secondary | ICD-10-CM

## 2019-04-23 NOTE — Telephone Encounter (Signed)
Will route to PCP for review. 

## 2019-04-23 NOTE — Telephone Encounter (Signed)
New Message   Pt calling, states he needs another referral to [place] center because the previous one expired. Please f/u

## 2019-04-24 NOTE — Telephone Encounter (Signed)
Referral has been placed. 

## 2019-04-25 ENCOUNTER — Telehealth: Payer: Self-pay | Admitting: Medical Oncology

## 2019-04-25 ENCOUNTER — Telehealth: Payer: Self-pay | Admitting: Family Medicine

## 2019-04-25 NOTE — Telephone Encounter (Signed)
Patients call taken.  Patient identified by name and date of birth by wife.  Patient's wife states patient had an episode of hematemesis this morning.  Patient told wife it was a small amount and had no repeat episodes since.  Patient's wife states patient has not been eating or drinking except for very little.  Patient's wife states patient has been to the ED several times where he has been given fluids, pain medication, and sent home.  Patient's wife states they called the Cancer center and was told that since patient is no longer receiving treatments that it was a PCP situation.  Patient's wife was told that this information would be passed along to PCP.  Patient has an appointment tomorrow, which the wife insists be in person.   Patient's wife told to attempt small portions of liquid nutrition over long periods of time and to keep appointment.  Patient's wife  acknowledged understanding of advice.

## 2019-04-25 NOTE — Telephone Encounter (Signed)
He can come for symptom management or go to the ED.

## 2019-04-25 NOTE — Telephone Encounter (Signed)
Patient wife called stating that the patient has been vomiting blood beginning this morning. Patient wife states he has been to the ED but has only been giving him fluids and sending him back home. Patient wife states he has also been feeling weak and is not sure if the cancer is spreading or whats going on. Please follow up.

## 2019-04-25 NOTE — Telephone Encounter (Addendum)
'  I have issues. I have not eaten in 2 weeks, please call me I need help" I spoke to wife who reported pt "spitting up blood". I instructed her to contact PCP for his symptom. Pt currently on observation by [facility].

## 2019-04-25 NOTE — Telephone Encounter (Addendum)
Wife notified that Dr Julien Nordmann said that Deavin should go to ED  or can come to [facility] tomorrow. Wife stated that pt has appt tomorrow with PCP. She appreciated my call.

## 2019-04-25 NOTE — Telephone Encounter (Signed)
Patient aware.Request OV for leg pain.  Scheduled televisit for Thursday 6/4 at 1050

## 2019-04-26 ENCOUNTER — Ambulatory Visit: Payer: Medicare Other | Attending: Family Medicine | Admitting: Family Medicine

## 2019-04-26 ENCOUNTER — Encounter: Payer: Self-pay | Admitting: Family Medicine

## 2019-04-26 ENCOUNTER — Other Ambulatory Visit: Payer: Self-pay

## 2019-04-26 DIAGNOSIS — M5116 Intervertebral disc disorders with radiculopathy, lumbar region: Secondary | ICD-10-CM

## 2019-04-26 DIAGNOSIS — R634 Abnormal weight loss: Secondary | ICD-10-CM | POA: Diagnosis not present

## 2019-04-26 DIAGNOSIS — K920 Hematemesis: Secondary | ICD-10-CM

## 2019-04-26 DIAGNOSIS — C3490 Malignant neoplasm of unspecified part of unspecified bronchus or lung: Secondary | ICD-10-CM | POA: Diagnosis not present

## 2019-04-26 DIAGNOSIS — W19XXXA Unspecified fall, initial encounter: Secondary | ICD-10-CM

## 2019-04-26 MED ORDER — MIRTAZAPINE 30 MG PO TABS: 30.0000 mg | ORAL_TABLET | Freq: Every day | ORAL | 3 refills | Status: DC

## 2019-04-26 MED ORDER — ACETAMINOPHEN-CODEINE #3 300-30 MG PO TABS: 1.0000 | ORAL_TABLET | Freq: Two times a day (BID) | ORAL | 0 refills | Status: DC | PRN

## 2019-04-26 MED ORDER — CETIRIZINE HCL 10 MG PO TABS: 10.0000 mg | ORAL_TABLET | Freq: Every day | ORAL | 1 refills | Status: DC

## 2019-04-26 NOTE — Progress Notes (Signed)
Virtual Visit via Telephone Note  I connected with Andrew West, on 04/26/2019 at 12:02 PM by telephone due to the COVID-19 pandemic and verified that I am speaking with the correct person using two identifiers.   Consent: I discussed the limitations, risks, security and privacy concerns of performing an evaluation and management service by telephone and the availability of in person appointments. I also discussed with the patient that there may be a patient responsible charge related to this service. The patient expressed understanding and agreed to proceed.   Location of Patient: Patient's home  Location of Provider: Clinic   Persons participating in Telemedicine visit: Andrew West-spouse Andrew West-CMA Dr. Felecia Shelling     History of Present Illness: Andrew West  is a 53 y.o. 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 He complains of multiple symptoms which include weight loss (now weighs 156 pounds, was 181 pounds 5 weeks ago and was 255 pounds 1 year ago), and a reduced appetite, dizziness including weakness of his legs with a recent fall couple of days ago, decreased appetite, nausea and vomiting. He also noticed spitting up a little amount of blood yesterday and complains of generalized body soreness including his back and his chest which he rates as a 9/10 and back pain radiates down his legs.  Denies presence of dyspnea, sinus congestion, he has a dry cough. He also noticed a knot in his right groin which is more painful and he is unsure of the duration. States he called the cancer center and they had informed him given  his treatments have been complete this would need to be addressed by his PCP rather than oncology clinic. He no longer has his PEG tube and is able to eat pured foods to little extent and also does some Ensure. He had requested referral to pain management for which I had referred him to [facility] pain management with an appointment coming up next week. Last visit to his oncologist Dr. Earlie Server was on 03/28/2019 and as per notes scan reviewed no concerning findings for disease progression.  He is unhappy because he feels the cancer center does not care about him and he would like referral to another oncologist.  He is also requesting pain medication in the interim until he sees [facility] pain management.   Past Medical History:  Diagnosis Date  . Anxiety   . CAD (coronary artery disease)    a. s/p multiple PCIs with last cath 11/2016 with severe multivessel CAD, s/p PCTA to LCx but unable to pass stent  . Cancer ([place])   . Chronic leg pain    bilateral  . Chronic lower back pain   . COPD (chronic obstructive pulmonary disease) ([place])   . Depression   . GERD (gastroesophageal reflux disease)    Takes Dexilant  . HLD (hyperlipidemia)   . Hypertension   . Rhabdomyolysis    h/o, r/t statins  . Sleep apnea    "can't tolerate mask" (12/16/2016)  . Type II diabetes mellitus (HCC)    Allergies  Allergen Reactions  . Iohexol Anaphylaxis    PT. TO BE PREMEDICATED PRIOR TO IV CONTRAST  PER DR Kris Hartmann /MMS//12/15/15Desc: PT BECAME SOB AND CHEST TIGHTNESS AFTER CONTRAST INJECTION.  [name],RT-RCT., [place] Date: [id number]     Current Outpatient Medications on File Prior to Visit  Medication Sig Dispense Refill  . ACCU-CHEK FASTCLIX LANCETS MISC Use as directed 102 each 12  . acetaminophen (TYLENOL) 500 MG tablet Take 1,000 mg by mouth every 6 (six) hours as needed for moderate pain.    Marland Kitchen albuterol (VENTOLIN HFA) 108 (90 Base) MCG/ACT inhaler Inhale 1 puff into the lungs every 6 (six) hours as  needed for wheezing or shortness of breath. 54 g 1  . aspirin 81 MG tablet Take 1 tablet (81 mg total) daily by mouth. 30 tablet   . Cholecalciferol (VITAMIN D) 2000 units tablet Take 1 tablet (2,000 Units total) by mouth daily. 30 tablet 1  . clopidogrel (PLAVIX) 75 MG tablet Take 1 tablet (75 mg total) by mouth daily. YOU MAY RESTART 08/26/2018 90 tablet 3  . diclofenac sodium (VOLTAREN) 1 % GEL Apply 2 g topically 4 (four) times daily. 1 Tube 0  . dronabinol (MARINOL) 5 MG capsule Take 1 capsule (5 mg total) by mouth 2 (two) times daily before a meal. 60 capsule 0  . DULoxetine (CYMBALTA) 60 MG capsule Take 1 capsule (60 mg total) by mouth daily. 90 capsule 1  . Fluticasone-Salmeterol (ADVAIR) 100-50 MCG/DOSE AEPB Inhale 1 puff into the lungs 2 (two) times daily. 3 each 1  . glucose blood (ACCU-CHEK GUIDE) test strip Use as instructed 100 each 12  . HYDROcodone-acetaminophen (NORCO/VICODIN) 5-325 MG tablet Take 1 tablet by mouth every 6 (six) hours as needed. 8 tablet 0  . LORazepam (ATIVAN) 0.5 MG tablet 1 tab po q 4-6 hours prn or 1 tab po 30 minutes prior to radiation (Patient taking differently: Take 0.5 mg by mouth every 6 (six) hours as needed for anxiety or sedation. 1 tab po q 4-6 hours prn or 1 tab po 30 minutes prior to radiation) 30 tablet 0  . nebivolol (BYSTOLIC) 5 MG tablet Take 1 tablet (5 mg total) by mouth daily. 90 tablet 3  . nitroGLYCERIN (NITROSTAT) 0.4 MG SL tablet Place 1 tablet (0.4 mg total) under the tongue every 5 (five) minutes as needed for chest pain. 25 tablet 2  . ondansetron (ZOFRAN ODT) 4 MG disintegrating tablet Take 1 tablet (4 mg total) by mouth every 8 (eight) hours as needed for nausea or vomiting. 20 tablet 0  . pantoprazole (PROTONIX) 40 MG tablet Take 1 tablet (40 mg total) by mouth daily. 90 tablet 1  . prochlorperazine (COMPAZINE) 10 MG tablet Take 1 tablet (10 mg total) by mouth every 6 (six) hours as needed for nausea or vomiting. 30 tablet 0  .  promethazine (PHENERGAN) 25 MG suppository Place 1 suppository (25 mg total) rectally every 6 (six) hours as needed for nausea or vomiting. 12 each 0  . promethazine (PHENERGAN) 25 MG tablet Take 1 tablet (25 mg total) by mouth every 6 (six) hours as needed for nausea or vomiting. 30 tablet 0  . ranolazine (RANEXA) 1000 MG SR tablet Take 1 tablet (1,000 mg total) by mouth 2 (two) times daily. 180 tablet 3  . rosuvastatin (CRESTOR) 20 MG tablet Take 1 tablet (20 mg total) by mouth daily. 90 tablet 1  . sucralfate (CARAFATE) 1 g tablet Take 1 tablet by mouth 4 (four) times daily -  before meals and at bedtime.  0  . tiotropium (SPIRIVA HANDIHALER) 18 MCG inhalation capsule Place  1 capsule (18 mcg total) into inhaler and inhale daily. 90 capsule 1  . vitamin B-12 (CYANOCOBALAMIN) 500 MCG tablet Take 1 tablet (500 mcg total) by mouth daily. 30 tablet 1  . zolpidem (AMBIEN) 5 MG tablet Take 1 tablet (5 mg total) by mouth at bedtime. 30 tablet 3  . Sterile Water LIQD Take 30 Bottles by mouth daily. (Patient not taking: Reported on 04/02/2019) 30 Bottle 0   No current facility-administered medications on file prior to visit.     Observations/Objective: Awake, alert, oriented x3 Not in acute distress  Assessment and Plan: 1. Malignant neoplasm of unspecified part of unspecified bronchus or lung ([place] Status post radiation and chemo We will order PET scan to evaluate for disease progression given new symptoms I will bring him in for an additional in person assessment given groin swelling and hematemesis Referred to [facility] oncology as per patient request - Ambulatory referral to Oncology - NM PET Image Initial (PI) Skull Base To Thigh; Future  2.  Weight loss Due to poor oral intake Previously had a PEG tube which was discontinued Advised to continue pured diet, Ensure Hopefully addition of Remeron will be beneficial  3.  Hematemesis Could be secondary to his cancer versus sinus  related Zyrtec added to regimen I will see him in person to evaluate further  4.  Fall Due to lightheadedness and weakness which could be as result of poor oral intake I am also concerned about his lumbar radiculopathy which could be attributing to this His cancer could also be progressing Advised to ambulate with an assistive device  5.Lumbar disc herniation with lumbar radiculopathy Prescribed Tylenol No. 3 until his appointment with pain management next week  Follow Up Instructions: I will have my nurse schedule in person visit with him as soon as possible   I discussed the assessment and treatment plan with the patient. The patient was provided an opportunity to ask questions and all were answered. The patient agreed with the plan and demonstrated an understanding of the instructions.   The patient was advised to call back or seek an in-person evaluation if the symptoms worsen or if the condition fails to improve as anticipated.     I provided 22 minutes total of non-face-to-face time during this encounter including median intraservice time, reviewing previous notes, labs, imaging, medications, management and patient verbalized understanding.     [name], MD, FAAFP. [facility], [place]   04/26/2019, 12:02 PM

## 2019-04-26 NOTE — Progress Notes (Signed)
Patient has been called and DOB has been verified. Patient has been screened and transferred to PCP to start phone visit.  Patient states that entire body is sore.  Patient has knot in groin area.  Patient is spitting up blood, losing weight and having dizzy spells.  Patient has questions about his cancer.

## 2019-04-27 NOTE — Patient Instructions (Signed)

## 2019-04-30 DIAGNOSIS — M545 Low back pain: Secondary | ICD-10-CM | POA: Diagnosis not present

## 2019-04-30 DIAGNOSIS — C3490 Malignant neoplasm of unspecified part of unspecified bronchus or lung: Secondary | ICD-10-CM | POA: Diagnosis not present

## 2019-04-30 DIAGNOSIS — G8929 Other chronic pain: Secondary | ICD-10-CM | POA: Diagnosis not present

## 2019-04-30 DIAGNOSIS — Z79899 Other long term (current) drug therapy: Secondary | ICD-10-CM | POA: Diagnosis not present

## 2019-05-01 ENCOUNTER — Telehealth: Payer: Self-pay | Admitting: Internal Medicine

## 2019-05-01 NOTE — Telephone Encounter (Signed)
Faxed medical records to [facility], [place]. Release [id number]

## 2019-05-03 ENCOUNTER — Telehealth: Payer: Self-pay | Admitting: *Deleted

## 2019-05-03 ENCOUNTER — Encounter (HOSPITAL_COMMUNITY)
Admission: RE | Admit: 2019-05-03 | Discharge: 2019-05-03 | Disposition: A | Discharge status: Home / Self Care | Payer: Medicare Other | Source: Ambulatory Visit | Attending: Family Medicine | Admitting: Family Medicine

## 2019-05-03 ENCOUNTER — Other Ambulatory Visit: Payer: Self-pay | Admitting: Family Medicine

## 2019-05-03 ENCOUNTER — Telehealth: Payer: Self-pay | Admitting: Internal Medicine

## 2019-05-03 ENCOUNTER — Other Ambulatory Visit: Payer: Self-pay

## 2019-05-03 DIAGNOSIS — C787 Secondary malignant neoplasm of liver and intrahepatic bile duct: Secondary | ICD-10-CM | POA: Insufficient documentation

## 2019-05-03 DIAGNOSIS — C7951 Secondary malignant neoplasm of bone: Secondary | ICD-10-CM | POA: Insufficient documentation

## 2019-05-03 DIAGNOSIS — I2510 Atherosclerotic heart disease of native coronary artery without angina pectoris: Secondary | ICD-10-CM | POA: Insufficient documentation

## 2019-05-03 DIAGNOSIS — C3490 Malignant neoplasm of unspecified part of unspecified bronchus or lung: Secondary | ICD-10-CM | POA: Diagnosis not present

## 2019-05-03 DIAGNOSIS — J439 Emphysema, unspecified: Secondary | ICD-10-CM | POA: Diagnosis not present

## 2019-05-03 LAB — GLUCOSE, CAPILLARY: Glucose-Capillary: 70 mg/dL (ref 70–99)

## 2019-05-03 MED ORDER — LEVOFLOXACIN 500 MG PO TABS: 500.0000 mg | ORAL_TABLET | Freq: Every day | ORAL | 0 refills | Status: DC

## 2019-05-03 MED ORDER — FLUDEOXYGLUCOSE F - 18 (FDG) INJECTION
8.7000 | Freq: Once | INTRAVENOUS | Status: AC
  Administered 2019-05-03: 08:00:00 8.7 via INTRAVENOUS

## 2019-05-03 NOTE — Telephone Encounter (Signed)
TCT patient to se if he wants an appt with Dr. Julien Nordmann. Pt had recent scan done and needs follow up.  Spoke with patient. Asked patient if he had set up an appt at [facility] as he had talked about doing that. Pt stated he had not. Asked pt if he wanted to continue to be seen by Dr. Julien Nordmann and he stated he did.  Advised that a scheduler will be calling to schedulean appt with labs and Dr. Julien Nordmann next week.  Pt voiced understanding

## 2019-05-03 NOTE — Telephone Encounter (Signed)
Scheduled appt per 6/11 sch message - not able to reach pt . Left message with apt date and time

## 2019-05-07 ENCOUNTER — Encounter: Payer: Self-pay | Admitting: Physician Assistant

## 2019-05-07 ENCOUNTER — Emergency Department (HOSPITAL_COMMUNITY)
Admission: EM | Admit: 2019-05-07 | Discharge: 2019-05-08 | Disposition: A | Discharge status: Home / Self Care | Payer: Medicare Other | Attending: Emergency Medicine | Admitting: Emergency Medicine

## 2019-05-07 ENCOUNTER — Inpatient Hospital Stay: Payer: Medicare Other

## 2019-05-07 ENCOUNTER — Encounter: Payer: Self-pay | Admitting: Family Medicine

## 2019-05-07 ENCOUNTER — Telehealth: Payer: Self-pay | Admitting: *Deleted

## 2019-05-07 ENCOUNTER — Other Ambulatory Visit: Payer: Medicare Other

## 2019-05-07 ENCOUNTER — Inpatient Hospital Stay (HOSPITAL_BASED_OUTPATIENT_CLINIC_OR_DEPARTMENT_OTHER): Payer: Medicare Other | Admitting: Physician Assistant

## 2019-05-07 ENCOUNTER — Other Ambulatory Visit: Payer: Self-pay

## 2019-05-07 ENCOUNTER — Inpatient Hospital Stay: Payer: Medicare Other | Attending: Internal Medicine

## 2019-05-07 ENCOUNTER — Encounter (HOSPITAL_COMMUNITY): Payer: Self-pay | Admitting: Emergency Medicine

## 2019-05-07 ENCOUNTER — Other Ambulatory Visit: Payer: Self-pay | Admitting: Internal Medicine

## 2019-05-07 ENCOUNTER — Ambulatory Visit: Payer: Medicare Other | Admitting: Internal Medicine

## 2019-05-07 ENCOUNTER — Ambulatory Visit (HOSPITAL_BASED_OUTPATIENT_CLINIC_OR_DEPARTMENT_OTHER): Payer: Medicare Other | Admitting: Family Medicine

## 2019-05-07 VITALS — BP 113/84 | HR 86 | Temp 99.1°F | Resp 18 | Ht 69.0 in | Wt 160.3 lb

## 2019-05-07 VITALS — BP 122/86 | HR 83 | Temp 98.0°F | Ht 69.0 in | Wt 157.0 lb

## 2019-05-07 DIAGNOSIS — Z79899 Other long term (current) drug therapy: Secondary | ICD-10-CM | POA: Diagnosis not present

## 2019-05-07 DIAGNOSIS — Z7189 Other specified counseling: Secondary | ICD-10-CM

## 2019-05-07 DIAGNOSIS — R634 Abnormal weight loss: Secondary | ICD-10-CM | POA: Diagnosis not present

## 2019-05-07 DIAGNOSIS — C3411 Malignant neoplasm of upper lobe, right bronchus or lung: Secondary | ICD-10-CM

## 2019-05-07 DIAGNOSIS — R05 Cough: Secondary | ICD-10-CM | POA: Diagnosis not present

## 2019-05-07 DIAGNOSIS — N183 Chronic kidney disease, stage 3 (moderate): Secondary | ICD-10-CM | POA: Insufficient documentation

## 2019-05-07 DIAGNOSIS — Z955 Presence of coronary angioplasty implant and graft: Secondary | ICD-10-CM | POA: Insufficient documentation

## 2019-05-07 DIAGNOSIS — R4020 Unspecified coma: Secondary | ICD-10-CM | POA: Diagnosis not present

## 2019-05-07 DIAGNOSIS — E0800 Diabetes mellitus due to underlying condition with hyperosmolarity without nonketotic hyperglycemic-hyperosmolar coma (NKHHC): Secondary | ICD-10-CM | POA: Diagnosis not present

## 2019-05-07 DIAGNOSIS — Z95828 Presence of other vascular implants and grafts: Secondary | ICD-10-CM

## 2019-05-07 DIAGNOSIS — J449 Chronic obstructive pulmonary disease, unspecified: Secondary | ICD-10-CM | POA: Insufficient documentation

## 2019-05-07 DIAGNOSIS — Z7982 Long term (current) use of aspirin: Secondary | ICD-10-CM | POA: Insufficient documentation

## 2019-05-07 DIAGNOSIS — R531 Weakness: Secondary | ICD-10-CM

## 2019-05-07 DIAGNOSIS — J948 Other specified pleural conditions: Secondary | ICD-10-CM | POA: Diagnosis not present

## 2019-05-07 DIAGNOSIS — K4090 Unilateral inguinal hernia, without obstruction or gangrene, not specified as recurrent: Secondary | ICD-10-CM | POA: Diagnosis not present

## 2019-05-07 DIAGNOSIS — E1122 Type 2 diabetes mellitus with diabetic chronic kidney disease: Secondary | ICD-10-CM | POA: Diagnosis not present

## 2019-05-07 DIAGNOSIS — R630 Anorexia: Secondary | ICD-10-CM | POA: Insufficient documentation

## 2019-05-07 DIAGNOSIS — Z85118 Personal history of other malignant neoplasm of bronchus and lung: Secondary | ICD-10-CM | POA: Insufficient documentation

## 2019-05-07 DIAGNOSIS — F329 Major depressive disorder, single episode, unspecified: Secondary | ICD-10-CM | POA: Insufficient documentation

## 2019-05-07 DIAGNOSIS — R42 Dizziness and giddiness: Secondary | ICD-10-CM

## 2019-05-07 DIAGNOSIS — M5116 Intervertebral disc disorders with radiculopathy, lumbar region: Secondary | ICD-10-CM

## 2019-05-07 DIAGNOSIS — R0789 Other chest pain: Secondary | ICD-10-CM | POA: Diagnosis not present

## 2019-05-07 DIAGNOSIS — I208 Other forms of angina pectoris: Secondary | ICD-10-CM

## 2019-05-07 DIAGNOSIS — I700 Atherosclerosis of aorta: Secondary | ICD-10-CM | POA: Insufficient documentation

## 2019-05-07 DIAGNOSIS — C787 Secondary malignant neoplasm of liver and intrahepatic bile duct: Secondary | ICD-10-CM | POA: Diagnosis not present

## 2019-05-07 DIAGNOSIS — Z7902 Long term (current) use of antithrombotics/antiplatelets: Secondary | ICD-10-CM | POA: Insufficient documentation

## 2019-05-07 DIAGNOSIS — R1012 Left upper quadrant pain: Secondary | ICD-10-CM

## 2019-05-07 DIAGNOSIS — E860 Dehydration: Secondary | ICD-10-CM | POA: Insufficient documentation

## 2019-05-07 DIAGNOSIS — C7951 Secondary malignant neoplasm of bone: Secondary | ICD-10-CM | POA: Diagnosis not present

## 2019-05-07 DIAGNOSIS — I959 Hypotension, unspecified: Secondary | ICD-10-CM | POA: Diagnosis not present

## 2019-05-07 DIAGNOSIS — K219 Gastro-esophageal reflux disease without esophagitis: Secondary | ICD-10-CM | POA: Insufficient documentation

## 2019-05-07 DIAGNOSIS — G4730 Sleep apnea, unspecified: Secondary | ICD-10-CM | POA: Insufficient documentation

## 2019-05-07 DIAGNOSIS — Z87891 Personal history of nicotine dependence: Secondary | ICD-10-CM | POA: Diagnosis not present

## 2019-05-07 DIAGNOSIS — R0602 Shortness of breath: Secondary | ICD-10-CM | POA: Diagnosis not present

## 2019-05-07 DIAGNOSIS — F419 Anxiety disorder, unspecified: Secondary | ICD-10-CM | POA: Insufficient documentation

## 2019-05-07 DIAGNOSIS — I10 Essential (primary) hypertension: Secondary | ICD-10-CM | POA: Insufficient documentation

## 2019-05-07 DIAGNOSIS — I2510 Atherosclerotic heart disease of native coronary artery without angina pectoris: Secondary | ICD-10-CM | POA: Diagnosis not present

## 2019-05-07 DIAGNOSIS — I129 Hypertensive chronic kidney disease with stage 1 through stage 4 chronic kidney disease, or unspecified chronic kidney disease: Secondary | ICD-10-CM | POA: Insufficient documentation

## 2019-05-07 DIAGNOSIS — I6523 Occlusion and stenosis of bilateral carotid arteries: Secondary | ICD-10-CM | POA: Insufficient documentation

## 2019-05-07 DIAGNOSIS — R1032 Left lower quadrant pain: Secondary | ICD-10-CM

## 2019-05-07 DIAGNOSIS — R5382 Chronic fatigue, unspecified: Secondary | ICD-10-CM

## 2019-05-07 DIAGNOSIS — E785 Hyperlipidemia, unspecified: Secondary | ICD-10-CM | POA: Insufficient documentation

## 2019-05-07 DIAGNOSIS — E43 Unspecified severe protein-calorie malnutrition: Secondary | ICD-10-CM

## 2019-05-07 LAB — CBC WITH DIFFERENTIAL (CANCER CENTER ONLY)
Abs Immature Granulocytes: 0.01 10*3/uL (ref 0.00–0.07)
Basophils Absolute: 0 10*3/uL (ref 0.0–0.1)
Basophils Relative: 1 %
Eosinophils Absolute: 0.1 10*3/uL (ref 0.0–0.5)
Eosinophils Relative: 2 %
HCT: 30.6 % — ABNORMAL LOW (ref 39.0–52.0)
Hemoglobin: 9.7 g/dL — ABNORMAL LOW (ref 13.0–17.0)
Immature Granulocytes: 0 %
Lymphocytes Relative: 15 %
Lymphs Abs: 0.6 10*3/uL — ABNORMAL LOW (ref 0.7–4.0)
MCH: 27.9 pg (ref 26.0–34.0)
MCHC: 31.7 g/dL (ref 30.0–36.0)
MCV: 87.9 fL (ref 80.0–100.0)
Monocytes Absolute: 0.5 10*3/uL (ref 0.1–1.0)
Monocytes Relative: 12 %
Neutro Abs: 2.9 10*3/uL (ref 1.7–7.7)
Neutrophils Relative %: 70 %
Platelet Count: 159 10*3/uL (ref 150–400)
RBC: 3.48 MIL/uL — ABNORMAL LOW (ref 4.22–5.81)
RDW: 13.3 % (ref 11.5–15.5)
WBC Count: 4.1 10*3/uL (ref 4.0–10.5)
nRBC: 0 % (ref 0.0–0.2)

## 2019-05-07 LAB — CMP (CANCER CENTER ONLY)
ALT: 6 U/L (ref 0–44)
AST: 11 U/L — ABNORMAL LOW (ref 15–41)
Albumin: 3.3 g/dL — ABNORMAL LOW (ref 3.5–5.0)
Alkaline Phosphatase: 66 U/L (ref 38–126)
Anion gap: 12 (ref 5–15)
BUN: 12 mg/dL (ref 6–20)
CO2: 25 mmol/L (ref 22–32)
Calcium: 9 mg/dL (ref 8.9–10.3)
Chloride: 105 mmol/L (ref 98–111)
Creatinine: 1.98 mg/dL — ABNORMAL HIGH (ref 0.61–1.24)
GFR, Est AFR Am: 44 mL/min — ABNORMAL LOW (ref >60)
GFR, Estimated: 38 mL/min — ABNORMAL LOW (ref >60)
Glucose, Bld: 78 mg/dL (ref 70–99)
Potassium: 3.9 mmol/L (ref 3.5–5.1)
Sodium: 142 mmol/L (ref 135–145)
Total Bilirubin: 0.2 mg/dL — ABNORMAL LOW (ref 0.3–1.2)
Total Protein: 7 g/dL (ref 6.5–8.1)

## 2019-05-07 LAB — MAGNESIUM: Magnesium: 1.7 mg/dL (ref 1.7–2.4)

## 2019-05-07 LAB — CBG MONITORING, ED: Glucose-Capillary: 79 mg/dL (ref 70–99)

## 2019-05-07 LAB — GLUCOSE, POCT (MANUAL RESULT ENTRY): POC Glucose: 70 mg/dl (ref 70–99)

## 2019-05-07 MED ORDER — SODIUM CHLORIDE 0.9 % IV SOLN
INTRAVENOUS | Status: DC
  Administered 2019-05-07: via INTRAVENOUS

## 2019-05-07 MED ORDER — SODIUM CHLORIDE 0.9% FLUSH
10.0000 mL | INTRAVENOUS | Status: DC | PRN
  Administered 2019-05-07: 10 mL
  Filled 2019-05-07: qty 10

## 2019-05-07 MED ORDER — SODIUM CHLORIDE 0.9% FLUSH
3.0000 mL | Freq: Once | INTRAVENOUS | Status: AC
  Administered 2019-05-07: 3 mL via INTRAVENOUS

## 2019-05-07 MED ORDER — PROCHLORPERAZINE MALEATE 10 MG PO TABS: 10.0000 mg | ORAL_TABLET | Freq: Four times a day (QID) | ORAL | 0 refills | Status: DC | PRN

## 2019-05-07 MED ORDER — OXYCODONE-ACETAMINOPHEN 5-325 MG PO TABS: 1.0000 | ORAL_TABLET | Freq: Four times a day (QID) | ORAL | 0 refills | Status: DC | PRN

## 2019-05-07 MED ORDER — DRONABINOL 5 MG PO CAPS: 5.0000 mg | ORAL_CAPSULE | Freq: Two times a day (BID) | ORAL | 0 refills | Status: DC

## 2019-05-07 MED ORDER — SODIUM CHLORIDE 0.9 % IV SOLN
Freq: Once | INTRAVENOUS | Status: DC
  Filled 2019-05-07: qty 250

## 2019-05-07 MED ORDER — SODIUM CHLORIDE 0.9 % IV BOLUS
1000.0000 mL | Freq: Once | INTRAVENOUS | Status: AC
  Administered 2019-05-07: 1000 mL via INTRAVENOUS

## 2019-05-07 MED ORDER — HEPARIN SOD (PORK) LOCK FLUSH 100 UNIT/ML IV SOLN
500.0000 [IU] | Freq: Once | INTRAVENOUS | Status: AC | PRN
  Administered 2019-05-07: 500 [IU]
  Filled 2019-05-07: qty 5

## 2019-05-07 MED FILL — PROCHLORPERAZINE 10 MG TAB: 10 | 8 days supply | Qty: 30 | Fill #0

## 2019-05-07 MED FILL — OXYCODONE-ACETAMINOPHEN 5-3: 5-325 | 15 days supply | Qty: 60 | Fill #0

## 2019-05-07 NOTE — ED Notes (Signed)
Pt's wife called and requested to speak with his nurse. RN Davy Pique not available so took a message and left at her computer.  Pt's wife also requested to have the pt call her if he is feeling up to it.  Pt is resting, but passed her request along to him.

## 2019-05-07 NOTE — ED Notes (Signed)
Bed: VO72 Expected date:  Expected time:  Means of arrival:  Comments: 53 yo M/weakness- Ca pt

## 2019-05-07 NOTE — Progress Notes (Signed)
[facility] OFFICE PROGRESS NOTE  [name], MD [address] [place] [address]  DIAGNOSIS: Initially diagnosed as limited stage (T3, N3,M0)small cell lung cancer. He presented with right paratracheal mass in addition to right suprahilar mass and lymphadenopathy as well as right cervical lymph node diagnosed in October 2019. He showed evidence of metastatic disease to the spine, liver, as well as suspicious pleural based metastasis in June 2020.   PRIOR THERAPY: Systemic chemotherapy with cisplatin 80 mg/M2 on day 1 and etoposide 100 mg/M2 on days 1, 2 and 3 every 3 weeks.  Status post 3 cycles.  This will be concurrent with radiotherapy with the start of cycle #2.  Starting from cycle #4 his dose of cisplatin would be 60 mg/M2 and etoposide 90 mg/M2.  Last dose was given November 23, 2018.  CURRENT THERAPY: Systemic chemotherapy with carboplatin for an AUC of 5 on days 1 and etoposide 100 mg/m2 on days 1,2, and 3 and Imfinzi 1500 mg IV every 3 weeks with Neulasta support. First dose expected on 05/14/2019.   INTERVAL HISTORY: Andrew West 53 y.o. male returns to the clinic for a follow-up visit today.  The patient is feeling unwell today and expresses multiple concerns including weight loss, generalized weakness, light headedness, lack of appetite, right leg pain, occasional nausea/vomiting, and left upper quadrant pain.  The patient has been on observation for his history of small cell lung cancer since January 2020.  Regarding the patient's weight loss and lack of appetite, the patient has a history of significant malnutrition with his prior chemotherapy.  Per patient report, the patient states that he has lost approximately 100 pounds since being diagnosed with cancer in the fall of 2019.  He previously had a PEG tube placed; however, this was removed on 03/22/2019 at the patient's request. Since his last appointment in May  2020, the patient has lost an additional  18 lbs. He used to supplement his diet with boost and Ensure when he was on treatment, however he has not taken this since that time.  He also was previously prescribed Marinol and Remeron, however he is no longer taking these as well.  The patient also endorses an associated left upper quadrant abdominal pain.  The pain is constant it is not exacerbated/or relieved with eating.  The patient also experiences nausea associated with eating which is not relieved with taking his antiemetic.  The patient also endorses generalized weakness, lightheadedness upon standing, occasional dizziness.  He also has significant radiculopathy in his right leg.  All of these factors have been playing a role and changes in his gait and occasional falls.  He denies any injuries. He describes that her often feels as though his "legs are too weak and are going to give out" from under him.  He ambulates with the assistance of a cane.   The patient denies any fever, chills, or night sweats.  He reports his baseline shortness of breath and productive cough.  He reports an occasional sharp pain in his left and right anterior chest.  The pain is nonexertional and occurs frequently at rest. He denies any diarrhea, constipation, urinary/fecal incontinence, or urinary retention.  The patient recently had a PET scan performed.  He is here today for evaluation and to review his PET scan.    MEDICAL HISTORY: Past Medical History:  Diagnosis Date  . Anxiety   . CAD (coronary artery disease)    a. s/p multiple PCIs with last  cath 11/2016 with severe multivessel CAD, s/p PCTA to LCx but unable to pass stent  . Cancer ([facility])   . Chronic leg pain    bilateral  . Chronic lower back pain   . COPD (chronic obstructive pulmonary disease) ([facility])   . Depression   . GERD (gastroesophageal reflux disease)    Takes Dexilant  . HLD (hyperlipidemia)   . Hypertension   . Rhabdomyolysis    h/o, r/t statins  . Sleep apnea    "can't tolerate  mask" (12/16/2016)  . Type II diabetes mellitus ([facility])     ALLERGIES:  is allergic to iohexol.  MEDICATIONS:  Current Outpatient Medications  Medication Sig Dispense Refill  . ACCU-CHEK FASTCLIX LANCETS MISC Use as directed 102 each 12  . acetaminophen (TYLENOL) 500 MG tablet Take 1,000 mg by mouth every 6 (six) hours as needed for moderate pain.    Marland Kitchen acetaminophen-codeine (TYLENOL #3) 300-30 MG tablet Take 1 tablet by mouth every 12 (twelve) hours as needed for moderate pain. Dx; lumbar radiculopathy, lung ca 30 tablet 0  . albuterol (VENTOLIN HFA) 108 (90 Base) MCG/ACT inhaler Inhale 1 puff into the lungs every 6 (six) hours as needed for wheezing or shortness of breath. 54 g 1  . aspirin 81 MG tablet Take 1 tablet (81 mg total) daily by mouth. 30 tablet   . cetirizine (ZYRTEC) 10 MG tablet Take 1 tablet (10 mg total) by mouth daily. 30 tablet 1  . Cholecalciferol (VITAMIN D) 2000 units tablet Take 1 tablet (2,000 Units total) by mouth daily. 30 tablet 1  . clopidogrel (PLAVIX) 75 MG tablet Take 1 tablet (75 mg total) by mouth daily. YOU MAY RESTART 08/26/2018 90 tablet 3  . diclofenac sodium (VOLTAREN) 1 % GEL Apply 2 g topically 4 (four) times daily. 1 Tube 0  . dronabinol (MARINOL) 5 MG capsule Take 1 capsule (5 mg total) by mouth 2 (two) times daily before a meal. 60 capsule 0  . DULoxetine (CYMBALTA) 60 MG capsule Take 1 capsule (60 mg total) by mouth daily. 90 capsule 1  . Fluticasone-Salmeterol (ADVAIR) 100-50 MCG/DOSE AEPB Inhale 1 puff into the lungs 2 (two) times daily. 3 each 1  . glucose blood (ACCU-CHEK GUIDE) test strip Use as instructed 100 each 12  . HYDROcodone-acetaminophen (NORCO/VICODIN) 5-325 MG tablet Take 1 tablet by mouth every 6 (six) hours as needed. (Patient not taking: Reported on 05/07/2019) 8 tablet 0  . levofloxacin (LEVAQUIN) 500 MG tablet Take 1 tablet (500 mg total) by mouth daily. (Patient not taking: Reported on 05/07/2019) 7 tablet 0  . LORazepam (ATIVAN) 0.5  MG tablet 1 tab po q 4-6 hours prn or 1 tab po 30 minutes prior to radiation (Patient taking differently: Take 0.5 mg by mouth every 6 (six) hours as needed for anxiety or sedation. 1 tab po q 4-6 hours prn or 1 tab po 30 minutes prior to radiation) 30 tablet 0  . mirtazapine (REMERON) 30 MG tablet Take 1 tablet (30 mg total) by mouth at bedtime. 30 tablet 3  . nebivolol (BYSTOLIC) 5 MG tablet Take 1 tablet (5 mg total) by mouth daily. 90 tablet 3  . nitroGLYCERIN (NITROSTAT) 0.4 MG SL tablet Place 1 tablet (0.4 mg total) under the tongue every 5 (five) minutes as needed for chest pain. 25 tablet 2  . ondansetron (ZOFRAN ODT) 4 MG disintegrating tablet Take 1 tablet (4 mg total) by mouth every 8 (eight) hours as needed for nausea or vomiting. [place]  tablet 0  . oxyCODONE-acetaminophen (PERCOCET/ROXICET) 5-325 MG tablet Take 1 tablet by mouth every 6 (six) hours as needed for severe pain. 60 tablet 0  . pantoprazole (PROTONIX) 40 MG tablet Take 1 tablet (40 mg total) by mouth daily. 90 tablet 1  . prochlorperazine (COMPAZINE) 10 MG tablet Take 1 tablet (10 mg total) by mouth every 6 (six) hours as needed for nausea or vomiting. 30 tablet 0  . promethazine (PHENERGAN) 25 MG suppository Place 1 suppository (25 mg total) rectally every 6 (six) hours as needed for nausea or vomiting. 12 each 0  . ranolazine (RANEXA) 1000 MG SR tablet Take 1 tablet (1,000 mg total) by mouth 2 (two) times daily. (Patient not taking: Reported on 05/07/2019) 180 tablet 3  . rosuvastatin (CRESTOR) 20 MG tablet Take 1 tablet (20 mg total) by mouth daily. 90 tablet 1  . Sterile Water LIQD Take 30 Bottles by mouth daily. (Patient not taking: Reported on 04/02/2019) 30 Bottle 0  . sucralfate (CARAFATE) 1 g tablet Take 1 tablet by mouth 4 (four) times daily -  before meals and at bedtime.  0  . tiotropium (SPIRIVA HANDIHALER) 18 MCG inhalation capsule Place 1 capsule (18 mcg total) into inhaler and inhale daily. 90 capsule 1  . vitamin  B-12 (CYANOCOBALAMIN) 500 MCG tablet Take 1 tablet (500 mcg total) by mouth daily. 30 tablet 1  . zolpidem (AMBIEN) 5 MG tablet Take 1 tablet (5 mg total) by mouth at bedtime. 30 tablet 3   Current Facility-Administered Medications  Medication Dose Route Frequency Provider Last Rate Last Dose  . [START ON 05/08/2019] 0.9 %  sodium chloride infusion   Intravenous Once Alenah Sarria L, PA-C        SURGICAL HISTORY:  Past Surgical History:  Procedure Laterality Date  . BACK SURGERY    . BRONCHIAL BIOPSY  08/25/2018   Procedure: BRONCHIAL BIOPSIES;  Surgeon: Garner Nash, DO;  Location: [facility];  Service: Cardiopulmonary;;  . CARDIAC CATHETERIZATION N/A 09/25/2015   Procedure: Left Heart Cath and Coronary Angiography;  Surgeon: Leonie Man, MD;  Location: [facility];  Service: Cardiovascular;  Laterality: N/A;  . CARDIAC CATHETERIZATION N/A 12/16/2016   Procedure: Left Heart Cath and Coronary Angiography;  Surgeon: Leonie Man, MD;  Location: [facility];  Service: Cardiovascular;  Laterality: N/A;  . CARDIAC CATHETERIZATION N/A 12/16/2016   Procedure: Coronary Balloon Angioplasty;  Surgeon: Leonie Man, MD;  Location: [facility];  Service: Cardiovascular;  Laterality: N/A;  . COLONOSCOPY W/ POLYPECTOMY    . CORONARY ANGIOPLASTY  09/25/2015   mid cir & om  . CORONARY ANGIOPLASTY WITH STENT PLACEMENT  10/09/2001   PTCA & stenting of mid AV circumflex; 2.5x45m Pixel stent  . CORONARY ANGIOPLASTY WITH STENT PLACEMENT  12/13/2001   PCI with stent to mid L circumflex, 95% stenosis to 0% residual  . CORONARY ANGIOPLASTY WITH STENT PLACEMENT  10/10/2003   PCI to mid AV circumflex; LAD 30% disease; RCA 100% occluded prox.  . CORONARY ANGIOPLASTY WITH STENT PLACEMENT  09/01/2011   PCI with stenting with bare metal stent to mid AV groove circumflex and PDA  . CORONARY ANGIOPLASTY WITH STENT PLACEMENT  10/17/2011   cutting balloon angioplasty of  ostial lateral OM1 branch and bifurcation AV groove circumflex OM junction; stenosis reduced to 0%  . ENDOBRONCHIAL ULTRASOUND Bilateral 08/25/2018   Procedure: ENDOBRONCHIAL ULTRASOUND;  Surgeon: IGarner Nash DO;  Location: [facility];  Service: Cardiopulmonary;  Laterality: Bilateral;  . ESOPHAGOGASTRODUODENOSCOPY (EGD) WITH PROPOFOL N/A 12/08/2018   Procedure: ESOPHAGOGASTRODUODENOSCOPY (EGD) WITH PROPOFOL;  Surgeon: Milus Banister, MD;  Location: [facility];  Service: Endoscopy;  Laterality: N/A;  . EXCISIONAL HEMORRHOIDECTOMY    . FINE NEEDLE ASPIRATION  08/25/2018   Procedure: FINE NEEDLE ASPIRATION;  Surgeon: Garner Nash, DO;  Location: [facility];  Service: Cardiopulmonary;;  . FLEXIBLE BRONCHOSCOPY  08/25/2018   Procedure: FLEXIBLE BRONCHOSCOPY;  Surgeon: Garner Nash, DO;  Location: [facility];  Service: Cardiopulmonary;;  . IR GASTROSTOMY TUBE MOD SED  12/11/2018  . IR GASTROSTOMY TUBE REMOVAL  03/22/2019  . IR IMAGING GUIDED PORT INSERTION  09/29/2018  . LEFT HEART CATHETERIZATION WITH CORONARY ANGIOGRAM N/A 10/18/2011   Procedure: LEFT HEART CATHETERIZATION WITH CORONARY ANGIOGRAM;  Surgeon: Leonie Man, MD;  Location: [facility] CATH LAB;  Service: Cardiovascular;  Laterality: N/A;  . LUMBAR LAMINECTOMY/DECOMPRESSION MICRODISCECTOMY  03/31/2012   Procedure: LUMBAR LAMINECTOMY/DECOMPRESSION MICRODISCECTOMY 1 LEVEL;  Surgeon: Charlie Pitter, MD;  Location: [facility];  Service: Neurosurgery;  Laterality: Left;  . TRANSTHORACIC ECHOCARDIOGRAM  07/28/2011   EF 55-65%; LVH, grade 1 diastolic dysfunction;     REVIEW OF SYSTEMS:   Review of Systems  Constitutional: Positive for decreased appetite, weight loss, fatigue, and generalized weakness. Negative for chills and fever  HENT:   Negative for mouth sores, nosebleeds, sore throat and trouble swallowing.   Eyes: Positive for visual blurring. Negative for eye problems and icterus.  Respiratory: Positive for baseline cough  and shortness of breath. Negative for hemoptysis and wheezing.   Cardiovascular: Positive for occasional sharp pain in his right and left anterior chest wall. Negative for leg swelling.  Gastrointestinal: Positive for LUQ abdominal pain, nausea, and occasional vomiting. Negative diarrhea and constipation Genitourinary: Negative for bladder incontinence, difficulty urinating, dysuria, frequency and hematuria.   Musculoskeletal: Positive for midline lumbar back pain and radiculopathy in his right leg. Positive for gait problems. Negative for  neck pain and neck stiffness.  Skin: Negative for itching and rash.  Neurological: Positive for occasional light headedness with standing and dizziness. Negative for extremity weakness, headaches, light-headedness and seizures.  Hematological: Negative for adenopathy. Does not bruise/bleed easily.  Psychiatric/Behavioral: Negative for confusion, depression and sleep disturbance. The patient is not nervous/anxious.     PHYSICAL EXAMINATION:  Blood pressure 113/84, pulse 86, temperature 99.1 F (37.3 C), temperature source Oral, resp. rate 18, height _0  (1.753 m), weight 160 lb 4.8 oz (72.7 kg), SpO2 97 %.  ECOG PERFORMANCE STATUS: 1 - Symptomatic but completely ambulatory  Physical Exam  Constitutional: Oriented to person, place, and time and thin-appearing and chronically ill appearing male and in no distress.  HENT:  Head: Normocephalic and atraumatic.  Mouth/Throat: No oropharyngeal exudate.  Eyes: Conjunctivae are normal. Right eye exhibits no discharge. Left eye exhibits no discharge. No scleral icterus.  Neck: Normal range of motion. Neck supple.  Cardiovascular: Normal rate, regular rhythm, normal heart sounds and intact distal pulses.   Pulmonary/Chest: Effort normal and breath sounds normal. No respiratory distress. No wheezes. No rales.  Abdominal: Tenderness to palpation over the LUQ. Soft. Bowel sounds are normal. Exhibits no distension and  no mass.  Musculoskeletal: Tenderness midline over the lumbar spine. Normal range of motion. Exhibits no edema.  Lymphadenopathy:    No cervical adenopathy.  Neurological: Alert and oriented to person, place, and time. Exhibits normal muscle tone. Gait normal. Coordination normal.  Skin: Skin is warm and dry. No rash noted.  Not diaphoretic. No erythema. No pallor.  Psychiatric: Mood, memory and judgment normal.  Vitals reviewed.  LABORATORY DATA: Lab Results  Component Value Date   WBC 4.1 05/07/2019   HGB 9.7 (L) 05/07/2019   HCT 30.6 (L) 05/07/2019   MCV 87.9 05/07/2019   PLT 159 05/07/2019      Chemistry      Component Value Date/Time   NA 142 05/07/2019 1445   NA 144 07/25/2018 0949   K 3.9 05/07/2019 1445   CL 105 05/07/2019 1445   CO2 25 05/07/2019 1445   BUN 12 05/07/2019 1445   BUN 10 07/25/2018 0949   CREATININE 1.98 (H) 05/07/2019 1445   CREATININE 0.96 12/13/2016 1151      Component Value Date/Time   CALCIUM 9.0 05/07/2019 1445   ALKPHOS 66 05/07/2019 1445   AST 11 (L) 05/07/2019 1445   ALT 6 05/07/2019 1445   BILITOT 0.2 (L) 05/07/2019 1445       RADIOGRAPHIC STUDIES:  Dg Chest 2 View  Result Date: 04/13/2019 CLINICAL DATA:  Right-sided chest pain.  Hypertension. EXAM: CHEST - 2 VIEW COMPARISON:  Apr 01, 2019 chest radiograph and chest CT March 04, 2019 FINDINGS: There is scarring in the right upper lobe toward the apex. There is right base atelectasis. There is no edema or consolidation. The heart size and pulmonary vascularity are normal. No adenopathy. Port-A-Cath tip is in the superior vena cava. No pneumothorax. No bone lesions. IMPRESSION: Areas of scarring and atelectasis on the right. No edema or consolidation. Heart size normal. No evident adenopathy. Port-A-Cath tip in superior vena cava. No evident pneumothorax. Electronically Signed   By: Lowella Grip III M.D.   On: 04/13/2019 20:58   Nm Pet Image Initial (pi) Skull Base To Thigh  Result  Date: 05/03/2019 CLINICAL DATA:  Subsequent treatment strategy for staging of lung cancer. EXAM: NUCLEAR MEDICINE PET SKULL BASE TO THIGH TECHNIQUE: 8.7 mCi F-18 FDG was injected intravenously. Full-ring PET imaging was performed from the skull base to thigh after the radiotracer. CT data was obtained and used for attenuation correction and anatomic localization. Fasting blood glucose: 70 mg/dl COMPARISON:  PET of 08/21/2018.  Diagnostic CTs of 03/04/2019. FINDINGS: Mediastinal blood pool activity: SUV max 2.4 Liver activity: SUV max NA NECK: No areas of abnormal hypermetabolism. Incidental CT findings: Right maxillary sinus mucous retention cyst or polyp. Bilateral carotid atherosclerosis. CHEST: The previously the described middle mediastinal and right hilar nodal hypermetabolism has resolved. A node at the level of the thoracic inlet measures 8 mm and a S.U.V. max of 7.4 on image 46/4. Vague, posterior right upper lobe 1.3 cm pulmonary opacity with hypermetabolism measures a S.U.V. max of 3.9 on image 22/8 and is new since 03/04/2019. Multifocal low-level pleural hypermetabolism with concurrent pleural thickening. Example at a S.U.V. max of 2.5 on image 89/4. Incidental CT findings: Resolution of right pleural fluid. Multivessel coronary artery atherosclerosis. Aortic atherosclerosis. Left Port-A-Cath tip low SVC. Centrilobular and paraseptal emphysema. ABDOMEN/PELVIS: Hypermetabolic high left hepatic lobe 11 mm lesion on image 102/4 measures a S.U.V. max of 10.0. A vague area of more mild right hepatic lobe hypermetabolism is without well-defined CT correlate and measures a S.U.V. max of 4.5, including on approximately image 108/4. No abdominopelvic nodal hypermetabolism. Likely physiologic anal hypermetabolism without CT correlate. This measures a S.U.V. max of 6.9. Incidental CT findings: Abdominal aortic and branch vessel atherosclerosis. Large colonic stool burden. SKELETON: Development of multifocal osseous  metastasis. Examples within the right side of  the C2 vertebral body and anterior right femoral neck. Index lesion within the L4 vertebral body measures a S.U.V. max of 16.3. Incidental CT findings: none IMPRESSION: 1. Since the PET of 08/21/2018, mixed response to therapy. 2. The right paratracheal and right hilar nodal hypermetabolism has resolved. However, there has been interval development of hepatic and osseous metastasis, as well as a new hypermetabolic node at the level of the thoracic inlet. 3. Foci of right-sided pleural based hypermetabolism are indeterminate but suspicious for pleural metastasis. 4. Aortic atherosclerosis (ICD10-I70.0), coronary artery atherosclerosis and emphysema (ICD10-J43.9). 5. Right upper lobe hypermetabolic pulmonary opacity, favored to represent an area of infection or inflammation. Electronically Signed   By: Abigail Miyamoto M.D.   On: 05/03/2019 09:24     ASSESSMENT/PLAN:  This is a very pleasant 53 year old African-American male who was initially diagnosed with limited stage small cell lung cancer.  He presented with a right paratracheal and a right suprahilar mass with right cervical lymphadenopathy.  He was diagnosed in October 2019.   He previously went systemic chemotherapy in addition to radiation.  He was admitted to the hospital for almost every cycle of chemotherapy with significant pancytopenia. His last dose was completed on 11/23/2018. He has been on observation since this time. He also experienced dysphasia and odynophagia which required a PEG tube for nutrition.  His PEG tube was removed on 03/22/2019.   The patient recently had a PET scan performed.  Dr. Julien Nordmann personally and independently reviewed the scan and discussed the results with the patient and his wife today, who was available via the telephone. The scan showed new development of hepatic and osseous metastases. The scan also showed right sided pleural based hypermetabolism which is suspicious for  pleural metastasis.   Dr. Julien Nordmann had a lengthy discussion with the patient about his current condition and treatment options.  He was given the option of resuming systemic chemotherapy with Carboplatin for an AUC of 5 on day 1, etoposide 100 mg/m2 on days 1, 2, and 3 and Imfinzi 1549m IV every 3 weeks with Neulasta support vs a referral to hospice/palliative care.    The patient is interested in resuming treatment with chemotherapy.  He is expected to start the first dose of his treatment on May 14, 2019.   Dr. MJulien Nordmanndiscussed the adverse side effects of treatment including but not limited to alopecia, myelosuppression, nausea and vomiting, peripheral neuropathy, liver or renal dysfunction as well as hearing deficit.  The adverse side effects of immunotherapy were also discussed including but not limited to immunotherapy mediated skin rash, diarrhea, inflammation of the lung, kidney, liver, thyroid or other endocrine dysfunction  The patient will come back for follow-up visit 2 weeks for evaluation and to manage any adverse side effects of treatment.   I will arrange for the patient to receive 1 L of normal saline in the clinic tomorrow due to his lack of fluid intake.   I will arrange for referral to radiation oncology for consideration of palliative radiation to the new osseous metastases as well as the pleural-based metastasis.   Regarding the patient's significant weight loss and lack of oral intake, I have sent a referral to our nutritionist to meet with the patient to discuss recommendations.  Additionally, I have placed a referral to interventional radiology for reinsertion of his PEG tube.  I sent a refill of Marinol to the patient's pharmacy to assist with stimulating his appetite.   I sent a refill of the patient's antiemetic  to his pharmacy.  For the patient's cancer related pain, I have sent a prescription to Percocet to the patient's pharmacy.   I will arrange for the patient to  receive a brain MRI to assess his new onset generalized weakness, balance abnormalities, and lightheadedness.  The patient was advised to call immediately if he has any concerning symptoms in the interval. The patient voices understanding of current disease status and treatment options and is in agreement with the current care plan. All questions were answered. The patient knows to call the clinic with any problems, questions or concerns. We can certainly see the patient much sooner if necessary  Orders Placed This Encounter  Procedures  . MR Brain W Wo Contrast    Standing Status:   Future    Standing Expiration Date:   05/06/2020    Order Specific Question:   ** REASON FOR EXAM (FREE TEXT)    Answer:   History of small cell lung cancer    Order Specific Question:   If indicated for the ordered procedure, I authorize the administration of contrast media per Radiology protocol    Answer:   Yes    Order Specific Question:   What is the patient's sedation requirement?    Answer:   No Sedation    Order Specific Question:   Does the patient have a pacemaker or implanted devices?    Answer:   No    Order Specific Question:   Use SRS Protocol?    Answer:   No    Order Specific Question:   Radiology Contrast Protocol - do NOT remove file path    Answer:   [email or web address]    Order Specific Question:   Preferred imaging location?    Answer:   [facility] (table limit-350 lbs)  . IR Gastrostomy Tube    Standing Status:   Future    Standing Expiration Date:   07/06/2020    Order Specific Question:   Reason for exam:    Answer:   poor oral intake/weight loss small cell lung cancer    Order Specific Question:   Preferred Imaging Location?    Answer:   [facility]  . Magnesium    Standing Status:   Standing    Number of Occurrences:   12    Standing Expiration Date:   05/06/2020  . Ambulatory referral to Nutrition and Diabetic E    Referral Priority:    Routine    Referral Type:   Consultation    Referral Reason:   Specialty Services Required    Number of Visits Requested:   1  . Ambulatory referral to Radiation Oncology    Referral Priority:   Urgent    Referral Type:   Consultation    Referral Reason:   Specialty Services Required    Requested Specialty:   Radiation Oncology    Number of Visits Requested:   [place], PA-C 05/07/19  ADDENDUM: Hematology/Oncology Attending: I had a face-to-face encounter with the patient today.  I recommended his care plan.  His wife was available by face time during the visit.  This is a very pleasant 53 years old African-American male with history of limited stage small cell lung cancer status post 4 cycles of systemic chemotherapy with cisplatin and etoposide with concurrent radiation with significant response to the treatment.  The patient has a rough time tolerating his previous systemic chemotherapy with almost admission to the hospital  after every cycle of the treatment.  His last CT scan of the chest abdomen pelvis 2 months ago showed no concerning findings for disease progression.  Unfortunately the patient was found to have evidence for disease metastasis and progression on recent PET scan on 05/03/2019 with metastatic focus in the liver as well as several areas of bone metastasis including L4 metastatic bone lesion. I had a lengthy discussion with the patient and his wife today about his current disease status and treatment options.  I discussed with the patient the option of palliative care and hospice referral versus consideration of palliative systemic chemotherapy with carboplatin for AUC of 5 on day 1, etoposide 100 mg/M2 on days 1, 2 and 3 as well as Imfinzi 1500 mg IV every 3 weeks for 4 cycles followed by maintenance Imfinzi if the patient has no evidence for disease progression.  I discussed with the patient the adverse effect of this treatment including alopecia,  myelosuppression, nausea and vomiting, peripheral neuropathy, liver or renal dysfunction as well as the adverse effect of immunotherapy. The patient and his wife are interested in proceeding with systemic chemotherapy and he is expected to start the first cycle of this treatment next week. For the metastatic disease at L4 and other lesion, we will refer the patient to Dr. Lisbeth Renshaw for consideration of palliative radiotherapy. For the malnutrition, I advised the patient to continue on Marinol twice daily.  He will also continue his current treatment with Cymbalta.  We will also refer the patient to the dietitian at the cancer center for evaluation of nutritional status and recommendation.  We will also arrange for the patient to have a PEG tube placed again for nutrition supplements since he lost a lot of weight recently. For pain management he is currently followed by the [facility] pain clinic.  We will give the patient prescription for Percocet until the pain clinic takes over his pain management completely. For the dehydration and lack of appetite, I will arrange for the patient to receive 1 L of normal saline tomorrow in the clinic. The patient will come back for follow-up visit in 2 weeks for evaluation and management of any adverse effect of his treatment. He was advised to call immediately if he has any other concerning symptoms in the interval.  Disclaimer: This note was dictated with voice recognition software. Similar sounding words can inadvertently be transcribed and may be missed upon review. Eilleen Kempf, MD 05/07/19

## 2019-05-07 NOTE — Progress Notes (Signed)
DISCONTINUE ON PATHWAY REGIMEN - Small Cell Lung     A cycle is every 21 days:     Etoposide      Cisplatin   **Always confirm dose/schedule in your pharmacy ordering system**  REASON: Disease Progression PRIOR TREATMENT: LOS15: Cisplatin 75 mg/m2 D1 + Etoposide 100 mg/m2 IV D1-3 q21 Days x 4 Cycles with Concurrent Radiation TREATMENT RESPONSE: Progressive Disease (PD)  START ON PATHWAY REGIMEN - Small Cell Lung     A cycle is every 21 days:     Etoposide      Carboplatin   **Always confirm dose/schedule in your pharmacy ordering system**  Patient Characteristics: Relapsed or Progressive Disease, Second Line, Relapse > 6 Months Therapeutic Status: Relapsed or Progressive Disease Line of Therapy: Second Line Time to Relapse: Relapse > 6 Months Intent of Therapy: Non-Curative / Palliative Intent, Discussed with Patient

## 2019-05-07 NOTE — ED Notes (Signed)
Patient made aware that urine sample is needed. Urinal at bedside.

## 2019-05-07 NOTE — ED Provider Notes (Signed)
[place] DEPT Provider Note  CSN: [id number] Arrival date & time: 05/07/19 2023  Chief Complaint(s) Weakness  HPI Andrew West is a 53 y.o. male with extensive past medical history listed below including metastatic lung cancer, malnutrition due to decreased appetite who presents to the emergency department for generalized fatigue.  Patient reports that this been ongoing for weeks but worsened tonight.  Reports that he was evaluated for the same several weeks ago and found to be dehydrated.  He is endorsing decreased oral intake and hydration due to his lack of appetite.  He denies any associated chest pain or shortness of breath.  No nausea or vomiting.  No diarrhea.  No abdominal pain.  Reports that he has been feeling dizzy and lightheaded upon standing.  EMS was called out who noted patient was orthostatic and provided him with IV fluids.  Patient reports improved lightheadedness and fatigue following the fluids.  HPI   Past Medical History Past Medical History:  Diagnosis Date  . Anxiety   . CAD (coronary artery disease)    a. s/p multiple PCIs with last cath 11/2016 with severe multivessel CAD, s/p PCTA to LCx but unable to pass stent  . Cancer ([place])   . Chronic leg pain    bilateral  . Chronic lower back pain   . COPD (chronic obstructive pulmonary disease) ([place])   . Depression   . GERD (gastroesophageal reflux disease)    Takes Dexilant  . HLD (hyperlipidemia)   . Hypertension   . Rhabdomyolysis    h/o, r/t statins  . Sleep apnea    "can't tolerate mask" (12/16/2016)  . Type II diabetes mellitus [facility])    Patient Active Problem List   Diagnosis Date Noted  . Port-A-Cath in place 05/07/2019  . Protein-calorie malnutrition, severe 12/26/2018  . Symptomatic anemia 12/25/2018  . Syncope 12/25/2018  . CKD (chronic kidney disease) stage 3, GFR 30-59 ml/min (HCC) 12/09/2018  . Radiation esophagitis   . Malnutrition of moderate degree  12/06/2018  . Esophageal stricture   . N&V (nausea and vomiting) 12/01/2018  . Antineoplastic chemotherapy induced pancytopenia ([place]) 12/01/2018  . Odynophagia   . Dysphagia 10/08/2018  . Hypokalemia 10/08/2018  . Hypomagnesemia 10/08/2018  . Type 2 diabetes mellitus ([place]) 10/08/2018  . Anemia 10/08/2018  . Sleep apnea 10/08/2018  . 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 Goals of care, counseling/discussion 08/31/2018  . Encounter for antineoplastic chemotherapy 08/31/2018  . Encounter for smoking cessation counseling 08/31/2018  . Malignant neoplasm of bronchus of right upper lobe ([place]) 08/29/2018  . Mediastinal mass 08/17/2018  . Vitamin D deficiency 08/04/2018  . Diabetic peripheral neuropathy ([place]) 06/30/2018  . Substernal chest pain 02/25/2018  . Benign prostatic hyperplasia with urinary hesitancy 11/17/2017  . Preoperative cardiovascular examination 07/12/2017  . Refractory angina ([place]) 05/24/2017  . Complex regional pain syndrome type I 03/24/2017  . Progressive angina ([place]) -Class III 12/16/2016  . Anxiety 06/28/2016  . Diastolic dysfunction-grade 2 with EF 60-65% Oct 2016 03/22/2016  . Hypertension 10/03/2015  . CAD S/P multiple PCI's 10/03/2015  . Pulmonary nodule 06/24/2015  . Cough 06/24/2015  . COPD with asthma ([place]) 06/24/2015  . Reactive airway disease 06/04/2015  . DOE (dyspnea on exertion) 04/15/2015  . Lumbar disc herniation with radiculopathy 03/31/2012  . Presence of stent in left circumflex coronary artery 10/18/2011    Class: History of  . TOBACCO ABUSE 02/27/2009  . ABDOMINAL PAIN, LEFT LOWER  QUADRANT 12/30/2008  . ABDOMINAL PAIN, EPIGASTRIC 12/05/2008  . Anxiety and depression 09/05/2008  . Hereditary and idiopathic peripheral neuropathy 09/05/2008  . GERD 09/05/2008  . Cervical disc disorder with radiculopathy of cervical region 08/19/2008  . HLD (hyperlipidemia) 04/25/2008    Class: Diagnosis of  . ALLERGIC RHINITIS  04/25/2008  . Backache 04/25/2008  . Chest pain, unspecified 04/25/2008  . COLONIC POLYPS, HX OF 04/25/2008  . Obesity 04/18/2008    Class: Diagnosis of  . ANAL FISSURE, HX OF 04/18/2008  . Diabetes mellitus ([place]) 01/30/2008    Class: History of  . RECTAL BLEEDING 01/30/2008   Home Medication(s) Prior to Admission medications   Medication Sig Start Date End Date Taking? Authorizing Provider  acetaminophen (TYLENOL) 500 MG tablet Take 1,000 mg by mouth every 6 (six) hours as needed for moderate pain.   Yes [provider]  acetaminophen-codeine (TYLENOL #3) 300-30 MG tablet Take 1 tablet by mouth every 12 (twelve) hours as needed for moderate pain. Dx; lumbar radiculopathy, lung ca 04/26/19  Yes Newlin, Enobong, MD  albuterol (VENTOLIN HFA) 108 (90 Base) MCG/ACT inhaler Inhale 1 puff into the lungs every 6 (six) hours as needed for wheezing or shortness of breath. 03/20/19  Yes [name], MD  aspirin 81 MG tablet Take 1 tablet (81 mg total) daily by mouth. 10/10/17  Yes Newlin, Enobong, MD  cetirizine (ZYRTEC) 10 MG tablet Take 1 tablet (10 mg total) by mouth daily. 04/26/19  Yes [name], MD  Cholecalciferol (VITAMIN D) 2000 units tablet Take 1 tablet (2,000 Units total) by mouth daily. 08/04/18  Yes Jamse Arn, MD  clopidogrel (PLAVIX) 75 MG tablet Take 1 tablet (75 mg total) by mouth daily. YOU MAY RESTART 08/26/2018 03/19/19  Yes Hilty, Nadean Corwin, MD  diclofenac sodium (VOLTAREN) 1 % GEL Apply 2 g topically 4 (four) times daily. 04/20/18  Yes [name], MD  dronabinol (MARINOL) 5 MG capsule Take 1 capsule (5 mg total) by mouth 2 (two) times daily before a meal. 05/07/19  Yes [name], [name], PA-C  DULoxetine (CYMBALTA) 60 MG capsule Take 1 capsule (60 mg total) by mouth daily. 03/20/19  Yes Newlin, Charlane Ferretti, MD  Fluticasone-Salmeterol (ADVAIR) 100-50 MCG/DOSE AEPB Inhale 1 puff into the lungs 2 (two) times daily. 03/20/19  Yes Newlin, Charlane Ferretti, MD   LORazepam (ATIVAN) 0.5 MG tablet 1 tab po q 4-6 hours prn or 1 tab po 30 minutes prior to radiation Patient taking differently: Take 0.5 mg by mouth every 6 (six) hours as needed for anxiety or sedation. 1 tab po q 4-6 hours prn or 1 tab po 30 minutes prior to radiation 08/30/18  Yes Hayden , PA-C  mirtazapine (REMERON) 30 MG tablet Take 1 tablet (30 mg total) by mouth at bedtime. 04/26/19  Yes Newlin, Charlane Ferretti, MD  nebivolol (BYSTOLIC) 5 MG tablet Take 1 tablet (5 mg total) by mouth daily. 03/19/19  Yes Hilty, Nadean Corwin, MD  nitroGLYCERIN (NITROSTAT) 0.4 MG SL tablet Place 1 tablet (0.4 mg total) under the tongue every 5 (five) minutes as needed for chest pain. 03/19/19  Yes Hilty, Nadean Corwin, MD  ondansetron (ZOFRAN ODT) 4 MG disintegrating tablet Take 1 tablet (4 mg total) by mouth every 8 (eight) hours as needed for nausea or vomiting. 02/19/19  Yes Tegeler, Gwenyth Allegra, MD  oxyCODONE-acetaminophen (PERCOCET/ROXICET) 5-325 MG tablet Take 1 tablet by mouth every 6 (six) hours as needed for severe pain. 05/07/19  Yes [name], [name], PA-C  pantoprazole (PROTONIX)  40 MG tablet Take 1 tablet (40 mg total) by mouth daily. 03/20/19  Yes [name], MD  prochlorperazine (COMPAZINE) 10 MG tablet Take 1 tablet (10 mg total) by mouth every 6 (six) hours as needed for nausea or vomiting. 05/07/19  Yes [name], [name], PA-C  promethazine (PHENERGAN) 25 MG suppository Place 1 suppository (25 mg total) rectally every 6 (six) hours as needed for nausea or vomiting. 04/02/19  Yes McDonald, Mia A, PA-C  rosuvastatin (CRESTOR) 20 MG tablet Take 1 tablet (20 mg total) by mouth daily. 03/20/19  Yes [name], MD  sucralfate (CARAFATE) 1 g tablet Take 1 tablet by mouth 4 (four) times daily -  before meals and at bedtime. 09/23/18  Yes [provider]  tiotropium (SPIRIVA HANDIHALER) 18 MCG inhalation capsule Place 1 capsule (18 mcg total) into inhaler and inhale daily.  03/20/19  Yes [name], MD  vitamin B-12 (CYANOCOBALAMIN) 500 MCG tablet Take 1 tablet (500 mcg total) by mouth daily. 06/30/18  Yes Jamse Arn, MD  zolpidem (AMBIEN) 5 MG tablet Take 1 tablet (5 mg total) by mouth at bedtime. 03/20/19  Yes [name], MD  ACCU-CHEK FASTCLIX LANCETS MISC Use as directed 01/02/19   [name], MD  glucose blood (ACCU-CHEK GUIDE) test strip Use as instructed 01/02/19   [name], MD  HYDROcodone-acetaminophen (NORCO/VICODIN) 5-325 MG tablet Take 1 tablet by mouth every 6 (six) hours as needed. Patient not taking: Reported on 05/07/2019 04/02/19   McDonald, Mia A, PA-C  levofloxacin (LEVAQUIN) 500 MG tablet Take 1 tablet (500 mg total) by mouth daily. Patient not taking: Reported on 05/07/2019 05/03/19   [name], MD  ranolazine (RANEXA) 1000 MG SR tablet Take 1 tablet (1,000 mg total) by mouth 2 (two) times daily. Patient not taking: Reported on 05/07/2019 05/24/17   Pixie Casino, MD  [place] Water LIQD Take 30 Bottles by mouth daily. Patient not taking: Reported on 04/02/2019 12/28/18   Kayleen Memos, DO                                                                                                                                    Past Surgical History Past Surgical History:  Procedure Laterality Date  . BACK SURGERY    . BRONCHIAL BIOPSY  08/25/2018   Procedure: BRONCHIAL BIOPSIES;  Surgeon: Garner Nash, DO;  Location: [facility] ENDOS[facility];  Service: Cardiopulmonary;;  . CARDIAC CATHETERIZATION N/A 09/25/2015   Procedure: Left Heart Cath and Coronary Angiography;  Surgeon: Leonie Man, MD;  Location: [place] CV LAB;  Service: Cardiovascular;  Laterality: N/A;  . CARDIAC CATHETERIZATION N/A 12/16/2016   Procedure: Left Heart Cath and Coronary Angiography;  Surgeon: Leonie Man, MD;  Location: [place] CV LAB;  Service: Cardiovascular;  Laterality: N/A;  . CARDIAC CATHETERIZATION N/A 12/16/2016   Procedure: Coronary Balloon  Angioplasty;  Surgeon: Leonie Man, MD;  Location: [facility]  INVASIVE CV LAB;  Service: Cardiovascular;  Laterality: N/A;  . COLONOSCOPY W/ POLYPECTOMY    . CORONARY ANGIOPLASTY  09/25/2015   mid cir & om  . CORONARY ANGIOPLASTY WITH STENT PLACEMENT  10/09/2001   PTCA & stenting of mid AV circumflex; 2.5x37m Pixel stent  . CORONARY ANGIOPLASTY WITH STENT PLACEMENT  12/13/2001   PCI with stent to mid L circumflex, 95% stenosis to 0% residual  . CORONARY ANGIOPLASTY WITH STENT PLACEMENT  10/10/2003   PCI to mid AV circumflex; LAD 30% disease; RCA 100% occluded prox.  . CORONARY ANGIOPLASTY WITH STENT PLACEMENT  09/01/2011   PCI with stenting with bare metal stent to mid AV groove circumflex and PDA  . CORONARY ANGIOPLASTY WITH STENT PLACEMENT  10/17/2011   cutting balloon angioplasty of ostial lateral OM1 branch and bifurcation AV groove circumflex OM junction; stenosis reduced to 0%  . ENDOBRONCHIAL ULTRASOUND Bilateral 08/25/2018   Procedure: ENDOBRONCHIAL ULTRASOUND;  Surgeon: IGarner Nash DO;  Location: [facility];  Service: Cardiopulmonary;  Laterality: Bilateral;  . ESOPHAGOGASTRODUODENOSCOPY (EGD) WITH PROPOFOL N/A 12/08/2018   Procedure: ESOPHAGOGASTRODUODENOSCOPY (EGD) WITH PROPOFOL;  Surgeon: JMilus Banister MD;  Location: [facility];  Service: Endoscopy;  Laterality: N/A;  . EXCISIONAL HEMORRHOIDECTOMY    . FINE NEEDLE ASPIRATION  08/25/2018   Procedure: FINE NEEDLE ASPIRATION;  Surgeon: IGarner Nash DO;  Location: [facility];  Service: Cardiopulmonary;;  . FLEXIBLE BRONCHOSCOPY  08/25/2018   Procedure: FLEXIBLE BRONCHOSCOPY;  Surgeon: IGarner Nash DO;  Location: [facility];  Service: Cardiopulmonary;;  . IR GASTROSTOMY TUBE MOD SED  12/11/2018  . IR GASTROSTOMY TUBE REMOVAL  03/22/2019  . IR IMAGING GUIDED PORT INSERTION  09/29/2018  . LEFT HEART CATHETERIZATION WITH CORONARY ANGIOGRAM N/A 10/18/2011   Procedure: LEFT HEART CATHETERIZATION WITH CORONARY ANGIOGRAM;   Surgeon: DLeonie Man MD;  [facility];  Service: Cardiovascular;  Laterality: N/A;  . LUMBAR LAMINECTOMY/DECOMPRESSION MICRODISCECTOMY  03/31/2012   Procedure: LUMBAR LAMINECTOMY/DECOMPRESSION MICRODISCECTOMY 1 LEVEL;  Surgeon: HCharlie Pitter MD;  [facility];  Service: Neurosurgery;  Laterality: Left;  . TRANSTHORACIC ECHOCARDIOGRAM  07/28/2011   EF 55-65%; LVH, grade 1 diastolic dysfunction;    Family History Family History  Problem Relation Age of Onset  . Heart attack Father   . Hypertension Mother   . Diabetes Mother   . Heart disease Brother        x 3   . Heart attack Brother        deceased  . Hypertension Sister   . Diabetes Sister   . Anesthesia problems Neg Hx   . Hypotension Neg Hx   . Malignant hyperthermia Neg Hx   . Pseudochol deficiency Neg Hx     Social History Social History   Tobacco Use  . Smoking status: Former Smoker    Packs/day: 0.25    Years: 25.00    Pack years: 6.25    Types: Cigarettes    Quit date: 05/24/2015    Years since quitting: 3.9  . Smokeless tobacco: Never Used  Substance Use Topics  . Alcohol use: No    Alcohol/week: 0.0 standard drinks  . Drug use: No   Allergies Iohexol  Review of Systems Review of Systems All other systems are reviewed and are negative for acute change except as noted in the HPI  Physical Exam Vital Signs  I have reviewed the triage vital signs BP (!) 116/92 (BP Location: Left Arm)   Pulse 80  Temp 98.5 F (36.9 C) (Oral)   Resp 17   Ht _0  (1.753 m)   Wt 72.7 kg   SpO2 99%   BMI 23.67 kg/m   Physical Exam Vitals signs reviewed.  Constitutional:      General: He is not in acute distress.    Appearance: He is well-developed and underweight. He is not diaphoretic.  HENT:     Head: Normocephalic and atraumatic.     Nose: Nose normal.  Eyes:     General: No scleral icterus.       Right eye: No discharge.        Left eye: No discharge.     Conjunctiva/sclera:  Conjunctivae normal.     Pupils: Pupils are equal, round, and reactive to light.  Neck:     Musculoskeletal: Normal range of motion and neck supple.  Cardiovascular:     Rate and Rhythm: Normal rate and regular rhythm.     Heart sounds: No murmur. No friction rub. No gallop.   Pulmonary:     Effort: Pulmonary effort is normal. No respiratory distress.     Breath sounds: Normal breath sounds. No stridor. No rales.  Abdominal:     General: There is no distension.     Palpations: Abdomen is soft.     Tenderness: There is no abdominal tenderness.  Musculoskeletal:        General: No tenderness.  Skin:    General: Skin is warm and dry.     Findings: No erythema or rash.  Neurological:     Mental Status: He is alert and oriented to person, place, and time.     ED Results and Treatments Labs (all labs ordered are listed, but only abnormal results are displayed) Labs Reviewed  URINALYSIS, ROUTINE W REFLEX MICROSCOPIC - Abnormal; Notable for the following components:      Result Value   APPearance HAZY (*)    Ketones, ur 5 (*)    All other components within normal limits  CBC WITH DIFFERENTIAL/PLATELET - Abnormal; Notable for the following components:   WBC 3.7 (*)    RBC 3.13 (*)    Hemoglobin 8.9 (*)    HCT 27.6 (*)    Platelets 139 (*)    Lymphs Abs 0.4 (*)    All other components within normal limits  COMPREHENSIVE METABOLIC PANEL - Abnormal; Notable for the following components:   Creatinine, Ser 1.85 (*)    Albumin 3.2 (*)    AST 11 (*)    GFR calc non Af Amer 41 (*)    GFR calc Af Amer 47 (*)    All other components within normal limits  LACTIC ACID, PLASMA  CK  LIPASE, BLOOD  CBG MONITORING, ED                                                                                                                         EKG  EKG Interpretation  Date/Time:  Monday May 07 2019 23:35:29 EDT Ventricular Rate:  79 PR Interval:    QRS Duration: 107 QT Interval:  407 QTC  Calculation: 467 R Axis:   70 Text Interpretation:  Sinus rhythm No significant change since last tracing Confirmed by Addison Lank [phone]) on 05/08/2019 4:33:23 AM      Radiology No results found. Pertinent labs & imaging results that were available during my care of the patient were reviewed by me and considered in my medical decision making (see chart for details).  Medications Ordered in ED Medications  sodium chloride 0.9 % bolus 1,000 mL (0 mLs Intravenous Stopped 05/08/19 0145)    And  0.9 %  sodium chloride infusion ( Intravenous Stopped 05/08/19 0326)  sodium chloride flush (NS) 0.9 % injection 3 mL (3 mLs Intravenous Given 05/07/19 2354)                                                                                                                                    Procedures Procedures  (including critical care time)  Medical Decision Making / ED Course I have reviewed the nursing notes for this encounter and the patient's prior records (if available in EHR or on provided paperwork).    Patient appears underweight.  Presents for orthostasis and generalized fatigue.  He is afebrile with stable vital signs.  Orthostatics here were reassuring.  Screening labs were obtained and grossly reassuring, close to patient's baseline.  He was provided with additional IV fluids which significantly improved the symptoms.  He was able to ambulate without complication.  Patient reports that his oncologist has already ordered to replace his PEG tube to provide additional nutrition given his lack of appetite and malnutrition.  The patient appears reasonably screened and/or stabilized for discharge and I doubt any other medical condition or other [facility] requiring further screening, evaluation, or treatment in the ED at this time prior to discharge.  The patient is safe for discharge with strict return precautions.   Final Clinical Impression(s) / ED Diagnoses Final diagnoses:  Generalized  weakness  Dehydration    Disposition: Discharge  Condition: Good  I have discussed the results, Dx and Tx plan with the patient who expressed understanding and agree(s) with the plan. Discharge instructions discussed at great length. The patient was given strict return precautions who verbalized understanding of the instructions. No further questions at time of discharge.    ED Discharge Orders    None       Follow Up: [name], MD [place] [address] [phone]  Schedule an appointment as soon as possible for a visit       This chart was dictated using voice recognition software.  Despite best efforts to proofread,  errors can occur which can change the documentation meaning.   Fatima Blank, MD 05/08/19 [phone]

## 2019-05-07 NOTE — Telephone Encounter (Signed)
TCT patient this morning as he has not arrived for 8:30 appt or labs. Spoke with patient. He states he did not get message the scheduler left last week. Able to reschedule patient to this afternoon with Cassie [name], PA along with labs and port flush.  Pt states he will be here.

## 2019-05-07 NOTE — ED Notes (Signed)
Pt is on the phone with his wife, who has called for updates multiple times.  Will obtain EKG and CBG momentarily.

## 2019-05-07 NOTE — ED Triage Notes (Signed)
Patient presents with increased weakness and general pain following a visit to his primary care MD. Patient was found by EMS to be hypotensive. EMS administered a 500 ml bolus (400 completed currently).    EMS vitals and CBG: 110/70 BP 60 HR 167 CBG 20 Resp Rate 98.0 Temp

## 2019-05-07 NOTE — Progress Notes (Signed)
Subjective:  Patient ID: Andrew West, male    DOB: 1965-12-13  Age: 53 y.o. MRN: [id number]  CC: Diabetes   HPI Andrew West  is a 53 y.o. 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  At his last office visit which was a telehealth visit he had complained of weight loss (of note he has lost 100 pounds in the last 1 year), reduced appetite, right groin swelling, recurrent falls and blood in his sputum.The blood in the sputum has resolved but the other symptoms persist. He endorses decreased appetite despite my placing him on Remeron.  He sips on bottle of water throughout the whole day.  Previously had a PEG tube which he states 'bothered his stomach' and Ensure does not help his appetite.  In the past he was on Marinol from radiation oncology however he is unsure if that was beneficial. He has a right groin swelling which increases and decreases in size and is unsure of the duration of the swelling.  There is no associated vomiting or abdominal pain.  He continues to fall and feels his legs are weak and give out from under him.  Last fall was 2 days ago and he ambulates with the aid of a cane.  I had referred him to pain management due to possible worsening of his lumbar radiculopathy (had an appointment last week).   PET scan also ordered to evaluate for possible tumor progression which revealed: IMPRESSION: 1. Since the PET of 08/21/2018, mixed response to therapy. 2. The right paratracheal and right hilar nodal hypermetabolism has resolved. However, there has been interval development of hepatic and osseous metastasis, as well as a  new hypermetabolic node at the level of the thoracic inlet. 3. Foci of right-sided pleural based hypermetabolism are indeterminate but suspicious for pleural metastasis. 4. Aortic atherosclerosis (ICD10-I70.0), coronary artery atherosclerosis and emphysema (ICD10-J43.9). 5. Right upper lobe hypermetabolic pulmonary opacity, favored to represent an area of infection or inflammation.   He was commenced on Levaquin due to area of infection or inflammation and his oncologist Dr [name] was notified.  He had requested a referral to [facility] oncology which I had placed last week and he is awaiting an appointment but he will be following up with his local oncologist later today.  Past Medical History:  Diagnosis Date  . Anxiety   . CAD (coronary artery disease)    a. s/p multiple PCIs with last cath 11/2016 with severe multivessel CAD, s/p PCTA to LCx but unable to pass stent  . Cancer (HCC)   . Chronic leg pain    bilateral  . Chronic lower back pain   . COPD (chronic obstructive pulmonary disease) (HCC)   . Depression   . GERD (gastroesophageal reflux disease)    Takes Dexilant  . HLD (hyperlipidemia)   . Hypertension   . Rhabdomyolysis    h/o, r/t statins  . Sleep apnea    "can't tolerate mask" (12/16/2016)  . Type II diabetes mellitus (HCC)     Past Surgical History:  Procedure Laterality Date  . BACK SURGERY    . BRONCHIAL BIOPSY  08/25/2018  Procedure: BRONCHIAL BIOPSIES;  Surgeon: Josephine Igo, DO;  Location: [facility] ENDOSCOPY;  Service: Cardiopulmonary;;  . CARDIAC CATHETERIZATION N/A 09/25/2015   Procedure: Left Heart Cath and Coronary Angiography;  Surgeon: Marykay Lex, MD;  Location: [facility] INVASIVE CV LAB;  Service: Cardiovascular;  Laterality: N/A;  . CARDIAC CATHETERIZATION N/A 12/16/2016   Procedure: Left Heart Cath and Coronary Angiography;  Surgeon: Marykay Lex, MD;  Location: [facility] INVASIVE CV LAB;  Service: Cardiovascular;  Laterality: N/A;  . CARDIAC  CATHETERIZATION N/A 12/16/2016   Procedure: Coronary Balloon Angioplasty;  Surgeon: Marykay Lex, MD;  Location: [facility] INVASIVE CV LAB;  Service: Cardiovascular;  Laterality: N/A;  . COLONOSCOPY W/ POLYPECTOMY    . CORONARY ANGIOPLASTY  09/25/2015   mid cir & om  . CORONARY ANGIOPLASTY WITH STENT PLACEMENT  10/09/2001   PTCA & stenting of mid AV circumflex; 2.5x37mm Pixel stent  . CORONARY ANGIOPLASTY WITH STENT PLACEMENT  12/13/2001   PCI with stent to mid L circumflex, 95% stenosis to 0% residual  . CORONARY ANGIOPLASTY WITH STENT PLACEMENT  10/10/2003   PCI to mid AV circumflex; LAD 30% disease; RCA 100% occluded prox.  . CORONARY ANGIOPLASTY WITH STENT PLACEMENT  09/01/2011   PCI with stenting with bare metal stent to mid AV groove circumflex and PDA  . CORONARY ANGIOPLASTY WITH STENT PLACEMENT  10/17/2011   cutting balloon angioplasty of ostial lateral OM1 branch and bifurcation AV groove circumflex OM junction; stenosis reduced to 0%  . ENDOBRONCHIAL ULTRASOUND Bilateral 08/25/2018   Procedure: ENDOBRONCHIAL ULTRASOUND;  Surgeon: Josephine Igo, DO;  Location: [facility]OSCOPY;  Service: Cardiopulmonary;  Laterality: Bilateral;  . ESOPHAGOGASTRODUODENOSCOPY (EGD) WITH PROPOFOL N/A 12/08/2018   Procedure: ESOPHAGOGASTRODUODENOSCOPY (EGD) WITH PROPOFOL;  Surgeon: Rachael Fee, MD;  Location: [facility];  Service: Endoscopy;  Laterality: N/A;  . EXCISIONAL HEMORRHOIDECTOMY    . FINE NEEDLE ASPIRATION  08/25/2018   Procedure: FINE NEEDLE ASPIRATION;  Surgeon: Josephine Igo, DO;  Location: [facility]OS[facility];  Service: Cardiopulmonary;;  . FLEXIBLE BRONCHOSCOPY  08/25/2018   Procedure: FLEXIBLE BRONCHOSCOPY;  Surgeon: Josephine Igo, DO;  Location: [facility]OS[facility];  Service: Cardiopulmonary;;  . IR GASTROSTOMY TUBE MOD SED  12/11/2018  . IR GASTROSTOMY TUBE REMOVAL  03/22/2019  . IR IMAGING GUIDED PORT INSERTION  09/29/2018  . LEFT HEART CATHETERIZATION WITH CORONARY ANGIOGRAM N/A 10/18/2011    Procedure: LEFT HEART CATHETERIZATION WITH CORONARY ANGIOGRAM;  Surgeon: Marykay Lex, MD;  Location: [facility] CATH LAB;  Service: Cardiovascular;  Laterality: N/A;  . LUMBAR LAMINECTOMY/DECOMPRESSION MICRODISCECTOMY  03/31/2012   Procedure: LUMBAR LAMINECTOMY/DECOMPRESSION MICRODISCECTOMY 1 LEVEL;  Surgeon: Temple Pacini, MD;  Location: [facility] NEURO ORS;  Service: Neurosurgery;  Laterality: Left;  . TRANSTHORACIC ECHOCARDIOGRAM  07/28/2011   EF 55-65%; LVH, grade 1 diastolic dysfunction;     Family History  Problem Relation Age of Onset  . Heart attack Father   . Hypertension Mother   . Diabetes Mother   . Heart disease Brother        x 3   . Heart attack Brother        deceased  . Hypertension Sister   . Diabetes Sister   . Anesthesia problems Neg Hx   . Hypotension Neg Hx   . Malignant hyperthermia Neg Hx   . Pseudochol deficiency Neg Hx     Allergies  Allergen Reactions  . Iohexol Anaphylaxis    PT. TO BE PREMEDICATED PRIOR TO IV CONTRAST PER DR Eppie Gibson /MMS//12/15/15Desc: PT BECAME SOB  AND CHEST TIGHTNESS AFTER CONTRAST INJECTION.  [name],RT-RCT., Onset Date: [id number]     Outpatient Medications Prior to Visit  Medication Sig Dispense Refill  . ACCU-CHEK FASTCLIX LANCETS MISC Use as directed 102 each 12  . acetaminophen (TYLENOL) 500 MG tablet Take 1,000 mg by mouth every 6 (six) hours as needed for moderate pain.    Marland Kitchen acetaminophen-codeine (TYLENOL #3) 300-30 MG tablet Take 1 tablet by mouth every 12 (twelve) hours as needed for moderate pain. Dx; lumbar radiculopathy, lung ca 30 tablet 0  . albuterol (VENTOLIN HFA) 108 (90 Base) MCG/ACT inhaler Inhale 1 puff into the lungs every 6 (six) hours as needed for wheezing or shortness of breath. 54 g 1  . aspirin 81 MG tablet Take 1 tablet (81 mg total) daily by mouth. 30 tablet   . cetirizine (ZYRTEC) 10 MG tablet Take 1 tablet (10 mg total) by mouth daily. 30 tablet 1  . Cholecalciferol (VITAMIN D) 2000 units tablet Take 1 tablet  (2,000 Units total) by mouth daily. 30 tablet 1  . clopidogrel (PLAVIX) 75 MG tablet Take 1 tablet (75 mg total) by mouth daily. YOU MAY RESTART 08/26/2018 90 tablet 3  . diclofenac sodium (VOLTAREN) 1 % GEL Apply 2 g topically 4 (four) times daily. 1 Tube 0  . dronabinol (MARINOL) 5 MG capsule Take 1 capsule (5 mg total) by mouth 2 (two) times daily before a meal. 60 capsule 0  . DULoxetine (CYMBALTA) 60 MG capsule Take 1 capsule (60 mg total) by mouth daily. 90 capsule 1  . Fluticasone-Salmeterol (ADVAIR) 100-50 MCG/DOSE AEPB Inhale 1 puff into the lungs 2 (two) times daily. 3 each 1  . glucose blood (ACCU-CHEK GUIDE) test strip Use as instructed 100 each 12  . LORazepam (ATIVAN) 0.5 MG tablet 1 tab po q 4-6 hours prn or 1 tab po 30 minutes prior to radiation (Patient taking differently: Take 0.5 mg by mouth every 6 (six) hours as needed for anxiety or sedation. 1 tab po q 4-6 hours prn or 1 tab po 30 minutes prior to radiation) 30 tablet 0  . mirtazapine (REMERON) 30 MG tablet Take 1 tablet (30 mg total) by mouth at bedtime. 30 tablet 3  . nebivolol (BYSTOLIC) 5 MG tablet Take 1 tablet (5 mg total) by mouth daily. 90 tablet 3  . nitroGLYCERIN (NITROSTAT) 0.4 MG SL tablet Place 1 tablet (0.4 mg total) under the tongue every 5 (five) minutes as needed for chest pain. 25 tablet 2  . ondansetron (ZOFRAN ODT) 4 MG disintegrating tablet Take 1 tablet (4 mg total) by mouth every 8 (eight) hours as needed for nausea or vomiting. 20 tablet 0  . pantoprazole (PROTONIX) 40 MG tablet Take 1 tablet (40 mg total) by mouth daily. 90 tablet 1  . prochlorperazine (COMPAZINE) 10 MG tablet Take 1 tablet (10 mg total) by mouth every 6 (six) hours as needed for nausea or vomiting. 30 tablet 0  . promethazine (PHENERGAN) 25 MG suppository Place 1 suppository (25 mg total) rectally every 6 (six) hours as needed for nausea or vomiting. 12 each 0  . promethazine (PHENERGAN) 25 MG tablet Take 1 tablet (25 mg total) by mouth  every 6 (six) hours as needed for nausea or vomiting. 30 tablet 0  . rosuvastatin (CRESTOR) 20 MG tablet Take 1 tablet (20 mg total) by mouth daily. 90 tablet 1  . tiotropium (SPIRIVA HANDIHALER) 18 MCG inhalation capsule Place 1 capsule (18 mcg total) into inhaler and inhale daily.  90 capsule 1  . vitamin B-12 (CYANOCOBALAMIN) 500 MCG tablet Take 1 tablet (500 mcg total) by mouth daily. 30 tablet 1  . zolpidem (AMBIEN) 5 MG tablet Take 1 tablet (5 mg total) by mouth at bedtime. 30 tablet 3  . HYDROcodone-acetaminophen (NORCO/VICODIN) 5-325 MG tablet Take 1 tablet by mouth every 6 (six) hours as needed. (Patient not taking: Reported on 05/07/2019) 8 tablet 0  . levofloxacin (LEVAQUIN) 500 MG tablet Take 1 tablet (500 mg total) by mouth daily. (Patient not taking: Reported on 05/07/2019) 7 tablet 0  . ranolazine (RANEXA) 1000 MG SR tablet Take 1 tablet (1,000 mg total) by mouth 2 (two) times daily. (Patient not taking: Reported on 05/07/2019) 180 tablet 3  . Sterile Water LIQD Take 30 Bottles by mouth daily. (Patient not taking: Reported on 04/02/2019) 30 Bottle 0  . sucralfate (CARAFATE) 1 g tablet Take 1 tablet by mouth 4 (four) times daily -  before meals and at bedtime.  0   No facility-administered medications prior to visit.      ROS Review of Systems  Constitutional: Positive for activity change, appetite change, fatigue and unexpected weight change.  HENT: Negative for sinus pressure and sore throat.   Eyes: Negative for visual disturbance.  Respiratory: Negative for cough, chest tightness and shortness of breath.   Cardiovascular: Negative for chest pain and leg swelling.  Gastrointestinal: Negative for abdominal distention, abdominal pain, constipation and diarrhea.  Endocrine: Negative.   Genitourinary: Negative for dysuria.  Musculoskeletal: Positive for back pain and gait problem. Negative for joint swelling and myalgias.  Skin: Negative for rash.  Allergic/Immunologic: Negative.    Neurological: Positive for weakness. Negative for light-headedness and numbness.  Psychiatric/Behavioral: Positive for dysphoric mood. Negative for suicidal ideas.    Objective:  BP 122/86   Pulse 83   Temp 98 F (36.7 C) (Oral)   Ht 5\' 9"  (1.753 m)   Wt 157 lb (71.2 kg)   SpO2 100%   BMI 23.18 kg/m   BP/Weight 05/07/2019 04/13/2019 04/02/2019  Systolic BP 122 128 120  Diastolic BP 86 80 89  Wt. (Lbs) 157 - -  BMI 23.18 - -      Physical Exam Constitutional:      Appearance: He is well-developed. He is cachectic. He is ill-appearing.  Cardiovascular:     Rate and Rhythm: Normal rate.     Heart sounds: Normal heart sounds. No murmur.  Pulmonary:     Effort: Pulmonary effort is normal.     Breath sounds: Normal breath sounds. No wheezing or rales.  Chest:     Chest wall: No tenderness.  Abdominal:     General: Bowel sounds are normal. There is no distension.     Palpations: Abdomen is soft. There is no mass.     Tenderness: There is no abdominal tenderness.  Genitourinary:    Comments: Reducible right inguinal hernia which is slightly tender Musculoskeletal: Normal range of motion.     Comments: Reduced motor strength in bilateral lower extremities  Neurological:     Mental Status: He is oriented to person, place, and time.  Psychiatric:     Comments: Dysphoric mood     CMP Latest Ref Rng & Units 04/13/2019 04/01/2019 03/26/2019  Glucose 70 - 99 mg/dL 75 84 76  BUN 6 - 20 mg/dL 17 16 13   Creatinine 0.61 - 1.24 mg/dL 1.61(W) 9.60(A) 5.40(J)  Sodium 135 - 145 mmol/L 139 143 144  Potassium 3.5 - 5.1 mmol/L 3.5  4.0 4.1  Chloride 98 - 111 mmol/L 103 104 106  CO2 22 - 32 mmol/L 26 29 29   Calcium 8.9 - 10.3 mg/dL 9.1 9.2 8.3(J)  Total Protein 6.5 - 8.1 g/dL - 7.2 6.9  Total Bilirubin 0.3 - 1.2 mg/dL - 0.5 <8.2(N)  Alkaline Phos 38 - 126 U/L - 61 57  AST 15 - 41 U/L - 11(L) 8(L)  ALT 0 - 44 U/L - 7 <6    Lipid Panel     Component Value Date/Time   CHOL 180  07/25/2018 0949   TRIG 168 (H) 12/11/2018 0443   HDL 24 (L) 07/25/2018 0949   CHOLHDL 7.5 (H) 07/25/2018 0949   CHOLHDL 5.2 (H) 09/21/2016 0900   VLDL 31 (H) 09/21/2016 0900   LDLCALC 126 (H) 07/25/2018 0949    CBC    Component Value Date/Time   WBC 5.8 04/13/2019 2029   RBC 3.34 (L) 04/13/2019 2029   HGB 9.8 (L) 04/13/2019 2029   HGB 8.9 (L) 03/26/2019 0958   HGB 14.4 10/10/2017 1504   HCT 30.0 (L) 04/13/2019 2029   HCT 39.8 02/25/2018 0231   PLT 213 04/13/2019 2029   PLT 170 03/26/2019 0958   PLT 274 10/10/2017 1504   MCV 89.8 04/13/2019 2029   MCV 83 10/10/2017 1504   MCH 29.3 04/13/2019 2029   MCHC 32.7 04/13/2019 2029   RDW 12.5 04/13/2019 2029   RDW 13.6 10/10/2017 1504   LYMPHSABS 0.6 (L) 04/01/2019 2252   LYMPHSABS 3.2 (H) 10/10/2017 1504   MONOABS 0.6 04/01/2019 2252   EOSABS 0.1 04/01/2019 2252   EOSABS 0.1 10/10/2017 1504   BASOSABS 0.0 04/01/2019 2252   BASOSABS 0.0 10/10/2017 1504    Lab Results  Component Value Date   HGBA1C 5.0 03/20/2019    Assessment & Plan:   1. Diabetes mellitus due to underlying condition with hyperosmolarity without coma, without long-term current use of insulin (HCC) Diet controlled with A1c of 5.0 Counseled on Diabetic diet, my plate method, 053 minutes of moderate intensity exercise/week Keep blood sugar logs with fasting goals of 80-120 mg/dl, random of less than 976 and in the event of sugars less than 60 mg/dl or greater than 734 mg/dl please notify the clinic ASAP. It is recommended that you undergo annual eye exams and annual foot exams. Pneumonia vaccine is recommended. - POCT glucose (manual entry)  2. 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 Lumbar disc herniation with  radiculopathy I had written a prescription for Tylenol No. 3 for him last week and he also received Percocet from the pain clinic This will be managed by [facility] pain management He is high risk for falls and has been advised to obtain a 4 pronged cane  4. Weight loss He might need to have a PEG tube reinserted due to poor oral intake  5. Right inguinal hernia At this time there is no evidence of obstruction or incarceration I have discussed warning signs to look out for and need for surgical referral however at this time his small cell lung cancer is more pressing and through joint decision-making we will proceed with that and once things are stable referral to general surgery if he would like.   No orders of the defined types were placed in this encounter.  Follow-up: Return for Medial conditions, keep previously scheduled appointment.       [name], MD, FAAFP. [facility] [place], [place] [phone]   05/07/2019, 11:08 AM

## 2019-05-08 ENCOUNTER — Inpatient Hospital Stay: Payer: Medicare Other

## 2019-05-08 ENCOUNTER — Other Ambulatory Visit: Payer: Self-pay

## 2019-05-08 ENCOUNTER — Other Ambulatory Visit: Payer: Self-pay | Admitting: *Deleted

## 2019-05-08 VITALS — BP 106/76 | HR 87 | Temp 98.3°F | Resp 18 | Wt 163.5 lb

## 2019-05-08 DIAGNOSIS — E860 Dehydration: Secondary | ICD-10-CM | POA: Diagnosis not present

## 2019-05-08 DIAGNOSIS — C9000 Multiple myeloma not having achieved remission: Secondary | ICD-10-CM

## 2019-05-08 DIAGNOSIS — Z95828 Presence of other vascular implants and grafts: Secondary | ICD-10-CM

## 2019-05-08 DIAGNOSIS — C3411 Malignant neoplasm of upper lobe, right bronchus or lung: Secondary | ICD-10-CM

## 2019-05-08 LAB — CK: Total CK: 53 U/L (ref 49–397)

## 2019-05-08 LAB — URINALYSIS, ROUTINE W REFLEX MICROSCOPIC
Bilirubin Urine: NEGATIVE
Glucose, UA: NEGATIVE mg/dL
Hgb urine dipstick: NEGATIVE
Ketones, ur: 5 mg/dL — AB
Leukocytes,Ua: NEGATIVE
Nitrite: NEGATIVE
Protein, ur: NEGATIVE mg/dL
Specific Gravity, Urine: 1.013 (ref 1.005–1.030)
pH: 5 (ref 5.0–8.0)

## 2019-05-08 LAB — COMPREHENSIVE METABOLIC PANEL
ALT: 8 U/L (ref 0–44)
AST: 11 U/L — ABNORMAL LOW (ref 15–41)
Albumin: 3.2 g/dL — ABNORMAL LOW (ref 3.5–5.0)
Alkaline Phosphatase: 57 U/L (ref 38–126)
Anion gap: 9 (ref 5–15)
BUN: 15 mg/dL (ref 6–20)
CO2: 25 mmol/L (ref 22–32)
Calcium: 8.9 mg/dL (ref 8.9–10.3)
Chloride: 108 mmol/L (ref 98–111)
Creatinine, Ser: 1.85 mg/dL — ABNORMAL HIGH (ref 0.61–1.24)
GFR calc Af Amer: 47 mL/min — ABNORMAL LOW (ref >60)
GFR calc non Af Amer: 41 mL/min — ABNORMAL LOW (ref >60)
Glucose, Bld: 79 mg/dL (ref 70–99)
Potassium: 4.2 mmol/L (ref 3.5–5.1)
Sodium: 142 mmol/L (ref 135–145)
Total Bilirubin: 0.5 mg/dL (ref 0.3–1.2)
Total Protein: 6.6 g/dL (ref 6.5–8.1)

## 2019-05-08 LAB — LACTIC ACID, PLASMA: Lactic Acid, Venous: 0.7 mmol/L (ref 0.5–1.9)

## 2019-05-08 LAB — CBC WITH DIFFERENTIAL/PLATELET
Abs Immature Granulocytes: 0.01 10*3/uL (ref 0.00–0.07)
Basophils Absolute: 0 10*3/uL (ref 0.0–0.1)
Basophils Relative: 0 %
Eosinophils Absolute: 0 10*3/uL (ref 0.0–0.5)
Eosinophils Relative: 1 %
HCT: 27.6 % — ABNORMAL LOW (ref 39.0–52.0)
Hemoglobin: 8.9 g/dL — ABNORMAL LOW (ref 13.0–17.0)
Immature Granulocytes: 0 %
Lymphocytes Relative: 10 %
Lymphs Abs: 0.4 10*3/uL — ABNORMAL LOW (ref 0.7–4.0)
MCH: 28.4 pg (ref 26.0–34.0)
MCHC: 32.2 g/dL (ref 30.0–36.0)
MCV: 88.2 fL (ref 80.0–100.0)
Monocytes Absolute: 0.3 10*3/uL (ref 0.1–1.0)
Monocytes Relative: 9 %
Neutro Abs: 2.9 10*3/uL (ref 1.7–7.7)
Neutrophils Relative %: 80 %
Platelets: 139 10*3/uL — ABNORMAL LOW (ref 150–400)
RBC: 3.13 MIL/uL — ABNORMAL LOW (ref 4.22–5.81)
RDW: 13.3 % (ref 11.5–15.5)
WBC: 3.7 10*3/uL — ABNORMAL LOW (ref 4.0–10.5)
nRBC: 0 % (ref 0.0–0.2)

## 2019-05-08 LAB — LIPASE, BLOOD: Lipase: 24 U/L (ref 11–51)

## 2019-05-08 MED ORDER — DRONABINOL 5 MG PO CAPS: 5.0000 mg | ORAL_CAPSULE | Freq: Two times a day (BID) | ORAL | 0 refills | Status: DC

## 2019-05-08 MED ORDER — SODIUM CHLORIDE 0.9% FLUSH
10.0000 mL | INTRAVENOUS | Status: DC | PRN
  Administered 2019-05-08: 10 mL
  Filled 2019-05-08: qty 10

## 2019-05-08 MED ORDER — HEPARIN SOD (PORK) LOCK FLUSH 100 UNIT/ML IV SOLN
500.0000 [IU] | Freq: Once | INTRAVENOUS | Status: AC | PRN
  Administered 2019-05-08: 500 [IU]
  Filled 2019-05-08: qty 5

## 2019-05-08 MED ORDER — SODIUM CHLORIDE 0.9 % IV SOLN
Freq: Once | INTRAVENOUS | Status: AC
  Administered 2019-05-08: 08:00:00 via INTRAVENOUS
  Filled 2019-05-08: qty 250

## 2019-05-08 NOTE — Patient Instructions (Addendum)
Dehydration, Adult  Dehydration is when there is not enough fluid or water in your body. This happens when you lose more fluids than you take in. Dehydration can range from mild to very bad. It should be treated right away to keep it from getting very bad. Symptoms of mild dehydration may include:  Thirst.  Dry lips.  Slightly dry mouth.  Dry, warm skin.  Dizziness. Symptoms of moderate dehydration may include:  Very dry mouth.  Muscle cramps.  Dark pee (urine). Pee may be the color of tea.  Your body making less pee.  Your eyes making fewer tears.  Heartbeat that is uneven or faster than normal (palpitations).  Headache.  Light-headedness, especially when you stand up from sitting.  Fainting (syncope). Symptoms of very bad dehydration may include:  Changes in skin, such as: ? Cold and clammy skin. ? Blotchy (mottled) or pale skin. ? Skin that does not quickly return to normal after being lightly pinched and let go (poor skin turgor).  Changes in body fluids, such as: ? Feeling very thirsty. ? Your eyes making fewer tears. ? Not sweating when body temperature is high, such as in hot weather. ? Your body making very little pee.  Changes in vital signs, such as: ? Weak pulse. ? Pulse that is more than 100 beats a minute when you are sitting still. ? Fast breathing. ? Low blood pressure.  Other changes, such as: ? Sunken eyes. ? Cold hands and feet. ? Confusion. ? Lack of energy (lethargy). ? Trouble waking up from sleep. ? Short-term weight loss. ? Unconsciousness. Follow these instructions at home:   If told by your doctor, drink an ORS: ? Make an ORS by using instructions on the package. ? Start by drinking small amounts, about  cup (120 mL) every 5-10 minutes. ? Slowly drink more until you have had the amount that your doctor said to have.  Drink enough clear fluid to keep your pee clear or pale yellow. If you were told to drink an ORS, finish  the ORS first, then start slowly drinking clear fluids. Drink fluids such as: ? Water. Do not drink only water by itself. Doing that can make the salt (sodium) level in your body get too low (hyponatremia). ? Ice chips. ? Fruit juice that you have added water to (diluted). ? Low-calorie sports drinks.  Avoid: ? Alcohol. ? Drinks that have a lot of sugar. These include high-calorie sports drinks, fruit juice that does not have water added, and soda. ? Caffeine. ? Foods that are greasy or have a lot of fat or sugar.  Take over-the-counter and prescription medicines only as told by your doctor.  Do not take salt tablets. Doing that can make the salt level in your body get too high (hypernatremia).  Eat foods that have minerals (electrolytes). Examples include bananas, oranges, potatoes, tomatoes, and spinach.  Keep all follow-up visits as told by your doctor. This is important. Contact a doctor if:  You have belly (abdominal) pain that: ? Gets worse. ? Stays in one area (localizes).  You have a rash.  You have a stiff neck.  You get angry or annoyed more easily than normal (irritability).  You are more sleepy than normal.  You have a harder time waking up than normal.  You feel: ? Weak. ? Dizzy. ? Very thirsty.  You have peed (urinated) only a small amount of very dark pee during 6-8 hours. Get help right away if:  You  have symptoms of very bad dehydration.  You cannot drink fluids without throwing up (vomiting).  Your symptoms get worse with treatment.  You have a fever.  You have a very bad headache.  You are throwing up or having watery poop (diarrhea) and it: ? Gets worse. ? Does not go away.  You have blood or something green (bile) in your throw-up.  You have blood in your poop (stool). This may cause poop to look black and tarry.  You have not peed in 6-8 hours.  You pass out (faint).  Your heart rate when you are sitting still is more than 100  beats a minute.  You have trouble breathing. This information is not intended to replace advice given to you by your health care provider. Make sure you discuss any questions you have with your health care provider. Document Released: 09/04/2009 Document Revised: 05/28/2016 Document Reviewed: 01/02/2016 Elsevier Interactive Patient Education  2019 [place] (COVID-19) Are you at risk?  Are you at risk for the Coronavirus (COVID-19)?  To be considered HIGH RISK for Coronavirus (COVID-19), you have to meet the following criteria:  . Traveled to [place], [place], [place], [place] or [place]; or in the [place] to [place], [place], [place], or [place]; and have fever, cough, and shortness of breath within the last 2 weeks of travel OR . Been in close contact with a person diagnosed with COVID-19 within the last 2 weeks and have fever, cough, and shortness of breath . IF YOU DO NOT MEET THESE CRITERIA, YOU ARE CONSIDERED LOW RISK FOR COVID-19.  What to do if you are HIGH RISK for COVID-19?  Marland Kitchen If you are having a medical emergency, call 911. . Seek medical care right away. Before you go to a doctor's office, urgent care or emergency department, call ahead and tell them about your recent travel, contact with someone diagnosed with COVID-19, and your symptoms. You should receive instructions from your physician's office regarding next steps of care.  . When you arrive at healthcare provider, tell the healthcare staff immediately you have returned from visiting [place], [place], [place], [place] or [place]; or traveled in the [place] to [place], [place], [place], or [place]; in the last two weeks or you have been in close contact with a person diagnosed with COVID-19 in the last 2 weeks.   . Tell the health care staff about your symptoms: fever, cough and shortness of breath. . After you have been seen by a medical provider, you will be either: o Tested for  (COVID-19) and discharged home on quarantine except to seek medical care if symptoms worsen, and asked to  - Stay home and avoid contact with others until you get your results (4-5 days)  - Avoid travel on public transportation if possible (such as bus, train, or airplane) or o Sent to the Emergency Department by EMS for evaluation, COVID-19 testing, and possible admission depending on your condition and test results.  What to do if you are LOW RISK for COVID-19?  Reduce your risk of any infection by using the same precautions used for avoiding the common cold or flu:  Marland Kitchen Wash your hands often with soap and warm water for at least 20 seconds.  If soap and water are not readily available, use an alcohol-based hand sanitizer with at least 60% alcohol.  . If coughing or sneezing, cover your mouth and nose by coughing or sneezing into the elbow areas  of your shirt or coat, into a tissue or into your sleeve (not your hands). . Avoid shaking hands with others and consider head nods or verbal greetings only. . Avoid touching your eyes, nose, or mouth with unwashed hands.  . Avoid close contact with people who are sick. . Avoid places or events with large numbers of people in one location, like concerts or sporting events. . Carefully consider travel plans you have or are making. . If you are planning any travel outside or inside the [place], visit the CDC's Travelers' Health webpage for the latest health notices. . If you have some symptoms but not all symptoms, continue to monitor at home and seek medical attention if your symptoms worsen. . If you are having a medical emergency, call 911.   [place] / e-Visit: [email or web address]         [facility]: [facility]: [phone]                   [facility]: [phone]

## 2019-05-08 NOTE — ED Notes (Addendum)
Patient denies dizziness, weakness, and SOB with orthostatic vital signs. Patient requested to ambulate to restroom. Ambulated with minimal assistance and with steady gait. Will continue to monitor.

## 2019-05-08 NOTE — ED Notes (Signed)
Discharge instructions reviewed. Opportunity for questions allowed. Patient alert, stable, and ambulatory at discharge.

## 2019-05-08 NOTE — ED Notes (Signed)
Patient resting peacefully in bed, stating "I feel much better now." Denies any needs at this time. Will continue to monitor patient.

## 2019-05-08 NOTE — Progress Notes (Signed)
Nutrition Assessment   Reason for Assessment:   Planning reinsertion of feeding tube.    ASSESSMENT:  53 year old male with small cell lung cancer, right paratracheal mass, right suprahilar mass and lymphadenopathy, and right cervical lymph node diagnosed in October 2019.  Patient with metastatic disease to spine, liver and pleural bases in June 2020. Patient has previously received chemo and radiation.  PEG tube placement but was removed on 03/22/2019 due to patient request.  Planning to restart systemic treatment with carboplatin, etoposide and imfinzi q 3 weeks.  First treatment planned for June 22.    Spoke with patient during infusion this am.  Asked patient to remove blanket from face during visit.  Patient reports lack of appetite for "awhile" now.  Reports that he eats maybe a 1/2 sandwich per day. Drinks ensure/boost about 2 per day along with water (drinks 2.5-3, 16 oz bottles daily) and soda.    Reports that previously he was on Two Cal tube feeding formula taking about 3 cartons per day.  Reports that he tolerated formula well.  Not sure if he has any formula or supplies at home still.     Nutrition Focused Physical Exam: deferred   Medications: marinol, remeron zofran  Labs: creatinine 1.85   Anthropometrics:   Height: 69 inches Weight: 163 lb 8 oz today (after fluids in ED and at cancer center.  Original weight 157 lb?? Noted 181 lb 4/28 BMI: 24  10% weight loss in the last 1 1/2 months, signficant   Estimated Energy Needs  Kcals: [phone] calories (using 157 lb) Protein: 106-124 g Fluid: 2.4 L   NUTRITION DIAGNOSIS: Inadequate oral intake related to cancer and cancer related treatment side effects as evidenced by 10% weight loss in the last 1 1/2 months, poor appetite and planning reinsertion of PEG tube   INTERVENTION:  Recommend restarting Two Cal as patient has tolerated in the past.  Recommend 4 cartons daily (8am, noon, 4pm and 8pm) with promod 61ml  TID.   Tube feeding regimen for first 3 feedings  18ml water flush 27ml promod mixed with 8ml water 26ml water flush 1 carton Two Cal 50ml water after 4th feeding 15ml water flush 1 carton Two Cal 21ml water flush Tube feeding will provide 2200 kcals, 109 g protein and 1528ml free water Patient will need to drink at least 2-2.5 liters daily to better meet fluid needs (2504-2748ml). Patient reports that he used [facility] in the past for formula and tube feeding supplies and willing to have them provide service again.  Patient to call RD once gets home regarding how much of tube feeding supplies he still has at home.  RD will contact [facility] (changed from [address]) once known when PEG tube is placed and number of supplies already have on hand).  Patient reports that he feels comfortable taking care of tube and administering feeding.     MONITORING, EVALUATION, GOAL: Patient will consume adequate calories and protein to prevent weight loss    Next Visit: to be determined  Azarah Dacy B. Zenia Resides, [place], [place] Registered Dietitian [phone] (pager)

## 2019-05-09 ENCOUNTER — Telehealth: Payer: Self-pay | Admitting: *Deleted

## 2019-05-09 NOTE — Telephone Encounter (Signed)
TCT IR @[facility] for PEG tube placement. [facility] IR unable to do until 05/22/19 Spoke with Anderson Malta. She conferred with IR physician and pt can have procedure on Monday, 05/14/19.  He may need to drink barium or have barium enema for accurate placement as pt has had PEG in the recent past.  He will also need COVID 19 testing done 3 days prior to procedure. This will be done @ [facility] pre-procedure testing site, Anderson Malta to call patient and make him aware of these arrangements

## 2019-05-10 ENCOUNTER — Ambulatory Visit
Admission: RE | Admit: 2019-05-10 | Discharge: 2019-05-10 | Disposition: A | Discharge status: Home / Self Care | Payer: Medicare Other | Source: Ambulatory Visit | Attending: Radiation Oncology | Admitting: Radiation Oncology

## 2019-05-10 ENCOUNTER — Other Ambulatory Visit: Payer: Self-pay | Admitting: Radiology

## 2019-05-10 ENCOUNTER — Telehealth: Payer: Self-pay | Admitting: Medical Oncology

## 2019-05-10 ENCOUNTER — Telehealth: Payer: Self-pay

## 2019-05-10 ENCOUNTER — Other Ambulatory Visit: Payer: Self-pay | Admitting: Medical Oncology

## 2019-05-10 ENCOUNTER — Other Ambulatory Visit: Payer: Self-pay

## 2019-05-10 ENCOUNTER — Other Ambulatory Visit (HOSPITAL_COMMUNITY)
Admission: RE | Admit: 2019-05-10 | Discharge: 2019-05-10 | Disposition: A | Discharge status: Home / Self Care | Payer: Medicare Other | Source: Ambulatory Visit | Attending: Interventional Radiology | Admitting: Interventional Radiology

## 2019-05-10 ENCOUNTER — Telehealth: Payer: Self-pay | Admitting: Physician Assistant

## 2019-05-10 VITALS — Wt 163.0 lb

## 2019-05-10 DIAGNOSIS — Z20828 Contact with and (suspected) exposure to other viral communicable diseases: Secondary | ICD-10-CM | POA: Diagnosis not present

## 2019-05-10 DIAGNOSIS — C7951 Secondary malignant neoplasm of bone: Secondary | ICD-10-CM

## 2019-05-10 DIAGNOSIS — Z510 Encounter for antineoplastic radiation therapy: Secondary | ICD-10-CM | POA: Diagnosis not present

## 2019-05-10 DIAGNOSIS — C3411 Malignant neoplasm of upper lobe, right bronchus or lung: Secondary | ICD-10-CM

## 2019-05-10 DIAGNOSIS — E43 Unspecified severe protein-calorie malnutrition: Secondary | ICD-10-CM

## 2019-05-10 DIAGNOSIS — G893 Neoplasm related pain (acute) (chronic): Secondary | ICD-10-CM | POA: Insufficient documentation

## 2019-05-10 DIAGNOSIS — Z923 Personal history of irradiation: Secondary | ICD-10-CM | POA: Diagnosis not present

## 2019-05-10 LAB — SARS CORONAVIRUS 2 (TAT 6-24 HRS): SARS Coronavirus 2: NEGATIVE

## 2019-05-10 NOTE — Progress Notes (Signed)
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   09/07/2018 - 10/30/2018: The patient was treated to the disease within the right lung initially to a dose of 60 Gy in 30 fractions. The patient then received a boost treatment for an additional 6 Gy delivered in 3 fractions. This yielded a final total dose of 66 Gy.   Narrative:  The patient returns today for routine follow-up. In summary this is a pleasant 53 y.o. gentleman with a history of limited stage small cell carcinoma of the right hilum who recently completed chemoradiation to the right chest.  He did well, and was able to receive prophylactic cranial irradiation earlier in the spring.  He only completed 8 of the total of 10 anticipated fractions due to unsteady gait and nausea.  He recently underwent restaging after experiencing episodes of vomiting and chest pain.  He was found on imaging to have a new right pleural effusion, patchy ill-defined consolidation within the right lower lobe, and was being followed for protein calorie malnutrition following G-tube placement.  He did undergo repeat pet imaging for restaging purposes on 05/03/2019, the previously described mediastinal and right hilar hypermetabolic activity have resolved, a node at the level of the thoracic inlet was present measuring 8 mm with an SUV max of 7.4.  There was some vague hypermetabolic activity with an SUV of 3.9 and a right upper lobe lesion measuring 1.3 cm and was new since his April scan.  He had multifocal low-level pleural hypermetabolic  change with an SUV max of 2.5.  There has been resolution of his previously seen right pleural fluid, now there was concern for a left hepatic lobe lesion measuring 11 mm with an SUV of 10.  In the right hepatic lobe there was also hypermetabolism without CT correlate with an SUV max of 4.3.  No abdominal pelvic nodal disease was identified, and there was physiologic anal hypermetabolic change.  Within the skeletal system there were new areas of multifocal osseous disease to the right of the C2 vertebral body the right femoral neck, and L4 vertebral body.  The L4 lesion had an SUV of 16.3, and the patient has had increasing low back pain.  The plan is to begin palliative chemotherapy with immunotherapy as well in the next week or 2, and he is seen via WebEx while in our clinic today to discuss options of palliative therapy with radiation.    On review of systems, the patient reports that he is doing well overall. He reports his eating is still poor and plans to move forward with replacement of his PEG on Monday. He reports low back pain and lower extremity pain that is diffuse. He denies any new chest pain, but continues to have shoulder and chest wall pain that is managed by his current regimen. He denies shortness of breath, cough, fevers, chills, night sweats. He denies any bowel or bladder disturbances, and denies abdominal pain, nausea or vomiting. He denies any new musculoskeletal or joint aches or pains, new skin lesions or concerns.  A complete review of systems is obtained and is otherwise negative.  Past Medical History:  Past Medical History:  Diagnosis Date   Anxiety    CAD (coronary artery disease)    a. s/p multiple PCIs with last cath 11/2016 with severe multivessel CAD, s/p PCTA to LCx but unable to pass stent   Cancer (HCC)    Chronic leg pain    bilateral   Chronic lower back pain    COPD (chronic obstructive pulmonary disease) (HCC)    Depression    GERD (gastroesophageal  reflux disease)    Takes Dexilant   HLD (hyperlipidemia)    Hypertension    Rhabdomyolysis    h/o, r/t statins   Sleep apnea    "can't tolerate mask" (12/16/2016)   Type II diabetes mellitus (HCC)     Past Surgical History: Past Surgical History:  Procedure Laterality Date   BACK SURGERY     BRONCHIAL BIOPSY  08/25/2018   Procedure: BRONCHIAL BIOPSIES;  Surgeon: Josephine Igo, DO;  Location: [facility];  Service: Cardiopulmonary;;   CARDIAC CATHETERIZATION N/A 09/25/2015   Procedure: Left Heart Cath and Coronary Angiography;  Surgeon: Marykay Lex, MD;  Location: [facility] INVASIVE CV LAB;  Service: Cardiovascular;  Laterality: N/A;   CARDIAC CATHETERIZATION N/A 12/16/2016   Procedure: Left Heart Cath and Coronary Angiography;  Surgeon: Marykay Lex, MD;  Location: [facility] INVASIVE CV LAB;  Service: Cardiovascular;  Laterality: N/A;   CARDIAC CATHETERIZATION N/A 12/16/2016   Procedure: Coronary Balloon Angioplasty;  Surgeon: Marykay Lex, MD;  Location: [facility] INVASIVE CV LAB;  Service: Cardiovascular;  Laterality: N/A;   COLONOSCOPY W/ POLYPECTOMY     CORONARY ANGIOPLASTY  09/25/2015   mid cir & om   CORONARY ANGIOPLASTY WITH STENT PLACEMENT  10/09/2001   PTCA & stenting of mid AV circumflex; 2.5x25mm Pixel stent   CORONARY ANGIOPLASTY WITH STENT PLACEMENT  12/13/2001   PCI with stent to mid L circumflex, 95% stenosis to 0% residual   CORONARY ANGIOPLASTY WITH STENT PLACEMENT  10/10/2003   PCI to mid AV circumflex; LAD 30% disease; RCA 100% occluded prox.   CORONARY ANGIOPLASTY WITH STENT PLACEMENT  09/01/2011   PCI with stenting with bare metal stent to mid AV groove circumflex and PDA   CORONARY ANGIOPLASTY WITH STENT PLACEMENT  10/17/2011   cutting balloon angioplasty of ostial lateral OM1 branch and bifurcation AV groove circumflex OM junction; stenosis reduced to 0%   ENDOBRONCHIAL ULTRASOUND Bilateral 08/25/2018   Procedure: ENDOBRONCHIAL ULTRASOUND;  Surgeon:  Josephine Igo, DO;  Location: [facility];  Service: Cardiopulmonary;  Laterality: Bilateral;   ESOPHAGOGASTRODUODENOSCOPY (EGD) WITH PROPOFOL N/A 12/08/2018   Procedure: ESOPHAGOGASTRODUODENOSCOPY (EGD) WITH PROPOFOL;  Surgeon: Rachael Fee, MD;  Location: [facility];  Service: Endoscopy;  Laterality: N/A;   EXCISIONAL HEMORRHOIDECTOMY     FINE NEEDLE ASPIRATION  08/25/2018   Procedure: FINE NEEDLE ASPIRATION;  Surgeon: Josephine Igo, DO;  Location: [facility];  Service: Cardiopulmonary;;   FLEXIBLE BRONCHOSCOPY  08/25/2018   Procedure: FLEXIBLE BRONCHOSCOPY;  Surgeon: Josephine Igo, DO;  Location: [facility];  Service: Cardiopulmonary;;   IR GASTROSTOMY TUBE MOD SED  12/11/2018   IR GASTROSTOMY TUBE REMOVAL  03/22/2019   IR IMAGING GUIDED PORT INSERTION  09/29/2018   LEFT HEART CATHETERIZATION WITH CORONARY ANGIOGRAM N/A 10/18/2011   Procedure: LEFT HEART CATHETERIZATION WITH CORONARY ANGIOGRAM;  Surgeon: Marykay Lex, MD;  Location: [facility] CATH LAB;  Service: Cardiovascular;  Laterality: N/A;   LUMBAR LAMINECTOMY/DECOMPRESSION  MICRODISCECTOMY  03/31/2012   Procedure: LUMBAR LAMINECTOMY/DECOMPRESSION MICRODISCECTOMY 1 LEVEL;  Surgeon: Temple Pacini, MD;  Location: [facility] NEURO ORS;  Service: Neurosurgery;  Laterality: Left;   TRANSTHORACIC ECHOCARDIOGRAM  07/28/2011   EF 55-65%; LVH, grade 1 diastolic dysfunction;     Social History:  Social History   Socioeconomic History   Marital status: Married    Spouse name: Not on file   Number of children: Not on file   Years of education: Not on file   Highest education level: Not on file  Occupational History   Not on file  Social Needs   Financial resource strain: Not on file   Food insecurity    Worry: Not on file    Inability: Not on file   Transportation needs    Medical: No    Non-medical: No  Tobacco Use   Smoking status: Former Smoker    Packs/day: 0.25    Years: 25.00    Pack years: 6.25    Types:  Cigarettes    Quit date: 05/24/2015    Years since quitting: 3.9   Smokeless tobacco: Never Used  Substance and Sexual Activity   Alcohol use: No    Alcohol/week: 0.0 standard drinks   Drug use: No   Sexual activity: Yes  Lifestyle   Physical activity    Days per week: Not on file    Minutes per session: Not on file   Stress: Not on file  Relationships   Social connections    Talks on phone: Not on file    Gets together: Not on file    Attends religious service: Not on file    Active member of club or organization: Not on file    Attends meetings of clubs or organizations: Not on file    Relationship status: Not on file   Intimate partner violence    Fear of current or ex partner: Not on file    Emotionally abused: Not on file    Physically abused: Not on file    Forced sexual activity: Not on file  Other Topics Concern   Not on file  Social History Narrative   Not on file  The patient is married and lives in [place].  He receives pain management care at [facility] pain management.  Family History: Family History  Problem Relation Age of Onset   Heart attack Father    Hypertension Mother    Diabetes Mother    Heart disease Brother        x 3    Heart attack Brother        deceased   Hypertension Sister    Diabetes Sister    Anesthesia problems Neg Hx    Hypotension Neg Hx    Malignant hyperthermia Neg Hx    Pseudochol deficiency Neg Hx      ALLERGIES:  is allergic to iohexol.  Meds: Current Outpatient Medications  Medication Sig Dispense Refill   ACCU-CHEK FASTCLIX LANCETS MISC Use as directed 102 each 12   acetaminophen (TYLENOL) 500 MG tablet Take 1,000 mg by mouth every 6 (six) hours as needed for moderate pain.     acetaminophen-codeine (TYLENOL #3) 300-30 MG tablet Take 1 tablet by mouth every 12 (twelve) hours as needed for moderate pain. Dx; lumbar radiculopathy, lung ca 30 tablet 0   albuterol (VENTOLIN HFA) 108 (90 Base)  MCG/ACT inhaler Inhale 1 puff into the lungs every 6 (six) hours as needed for wheezing or shortness of breath.  54 g 1   aspirin 81 MG tablet Take 1 tablet (81 mg total) daily by mouth. 30 tablet    cetirizine (ZYRTEC) 10 MG tablet Take 1 tablet (10 mg total) by mouth daily. 30 tablet 1   Cholecalciferol (VITAMIN D) 2000 units tablet Take 1 tablet (2,000 Units total) by mouth daily. 30 tablet 1   clopidogrel (PLAVIX) 75 MG tablet Take 1 tablet (75 mg total) by mouth daily. YOU MAY RESTART 08/26/2018 90 tablet 3   diclofenac sodium (VOLTAREN) 1 % GEL Apply 2 g topically 4 (four) times daily. 1 Tube 0   dronabinol (MARINOL) 5 MG capsule Take 1 capsule (5 mg total) by mouth 2 (two) times daily before a meal. 60 capsule 0   DULoxetine (CYMBALTA) 60 MG capsule Take 1 capsule (60 mg total) by mouth daily. 90 capsule 1   Fluticasone-Salmeterol (ADVAIR) 100-50 MCG/DOSE AEPB Inhale 1 puff into the lungs 2 (two) times daily. 3 each 1   glucose blood (ACCU-CHEK GUIDE) test strip Use as instructed 100 each 12   HYDROcodone-acetaminophen (NORCO/VICODIN) 5-325 MG tablet Take 1 tablet by mouth every 6 (six) hours as needed. 8 tablet 0   levofloxacin (LEVAQUIN) 500 MG tablet Take 1 tablet (500 mg total) by mouth daily. 7 tablet 0   LORazepam (ATIVAN) 0.5 MG tablet 1 tab po q 4-6 hours prn or 1 tab po 30 minutes prior to radiation (Patient taking differently: Take 0.5 mg by mouth every 6 (six) hours as needed for anxiety or sedation. 1 tab po q 4-6 hours prn or 1 tab po 30 minutes prior to radiation) 30 tablet 0   mirtazapine (REMERON) 30 MG tablet Take 1 tablet (30 mg total) by mouth at bedtime. 30 tablet 3   nebivolol (BYSTOLIC) 5 MG tablet Take 1 tablet (5 mg total) by mouth daily. 90 tablet 3   nitroGLYCERIN (NITROSTAT) 0.4 MG SL tablet Place 1 tablet (0.4 mg total) under the tongue every 5 (five) minutes as needed for chest pain. 25 tablet 2   ondansetron (ZOFRAN ODT) 4 MG disintegrating tablet  Take 1 tablet (4 mg total) by mouth every 8 (eight) hours as needed for nausea or vomiting. 20 tablet 0   oxyCODONE-acetaminophen (PERCOCET/ROXICET) 5-325 MG tablet Take 1 tablet by mouth every 6 (six) hours as needed for severe pain. 60 tablet 0   pantoprazole (PROTONIX) 40 MG tablet Take 1 tablet (40 mg total) by mouth daily. 90 tablet 1   prochlorperazine (COMPAZINE) 10 MG tablet Take 1 tablet (10 mg total) by mouth every 6 (six) hours as needed for nausea or vomiting. 30 tablet 0   promethazine (PHENERGAN) 25 MG suppository Place 1 suppository (25 mg total) rectally every 6 (six) hours as needed for nausea or vomiting. 12 each 0   ranolazine (RANEXA) 1000 MG SR tablet Take 1 tablet (1,000 mg total) by mouth 2 (two) times daily. 180 tablet 3   rosuvastatin (CRESTOR) 20 MG tablet Take 1 tablet (20 mg total) by mouth daily. 90 tablet 1   Sterile Water LIQD Take 30 Bottles by mouth daily. 30 Bottle 0   sucralfate (CARAFATE) 1 g tablet Take 1 tablet by mouth 4 (four) times daily -  before meals and at bedtime.  0   tiotropium (SPIRIVA HANDIHALER) 18 MCG inhalation capsule Place 1 capsule (18 mcg total) into inhaler and inhale daily. 90 capsule 1   vitamin B-12 (CYANOCOBALAMIN) 500 MCG tablet Take 1 tablet (500 mcg total) by mouth daily. 30 tablet  1   zolpidem (AMBIEN) 5 MG tablet Take 1 tablet (5 mg total) by mouth at bedtime. 30 tablet 3   No current facility-administered medications for this encounter.     Physical Findings:  vitals were not taken for this visit.   /10 In general this is a chronically ill-appearing African American male in no acute distress. He's alert and oriented x4 and appropriate throughout the examination. Cardiopulmonary assessment is negative for acute distress and he exhibits normal effort. Lower extremities have 5/5 strength when I was able to go in to the office room and examine him. He has intact sensory perception to light touch of the lower extremities  that was equal bilaterally.  Lab Findings: Lab Results  Component Value Date   WBC 3.7 (L) 05/07/2019   HGB 8.9 (L) 05/07/2019   HCT 27.6 (L) 05/07/2019   MCV 88.2 05/07/2019   PLT 139 (L) 05/07/2019     Radiographic Findings: Dg Chest 2 View  Result Date: 04/13/2019 CLINICAL DATA:  Right-sided chest pain.  Hypertension. EXAM: CHEST - 2 VIEW COMPARISON:  Apr 01, 2019 chest radiograph and chest CT March 04, 2019 FINDINGS: There is scarring in the right upper lobe toward the apex. There is right base atelectasis. There is no edema or consolidation. The heart size and pulmonary vascularity are normal. No adenopathy. Port-A-Cath tip is in the superior vena cava. No pneumothorax. No bone lesions. IMPRESSION: Areas of scarring and atelectasis on the right. No edema or consolidation. Heart size normal. No evident adenopathy. Port-A-Cath tip in superior vena cava. No evident pneumothorax. Electronically Signed   By: Bretta Bang III M.D.   On: 04/13/2019 20:58   Nm Pet Image Initial (pi) Skull Base To Thigh  Result Date: 05/03/2019 CLINICAL DATA:  Subsequent treatment strategy for staging of lung cancer. EXAM: NUCLEAR MEDICINE PET SKULL BASE TO THIGH TECHNIQUE: 8.7 mCi F-18 FDG was injected intravenously. Full-ring PET imaging was performed from the skull base to thigh after the radiotracer. CT data was obtained and used for attenuation correction and anatomic localization. Fasting blood glucose: 70 mg/dl COMPARISON:  PET of 78/29/5621.  Diagnostic CTs of 03/04/2019. FINDINGS: Mediastinal blood pool activity: SUV max 2.4 Liver activity: SUV max NA NECK: No areas of abnormal hypermetabolism. Incidental CT findings: Right maxillary sinus mucous retention cyst or polyp. Bilateral carotid atherosclerosis. CHEST: The previously the described middle mediastinal and right hilar nodal hypermetabolism has resolved. A node at the level of the thoracic inlet measures 8 mm and a S.U.V. max of 7.4 on image 46/4.  Vague, posterior right upper lobe 1.3 cm pulmonary opacity with hypermetabolism measures a S.U.V. max of 3.9 on image 22/8 and is new since 03/04/2019. Multifocal low-level pleural hypermetabolism with concurrent pleural thickening. Example at a S.U.V. max of 2.5 on image 89/4. Incidental CT findings: Resolution of right pleural fluid. Multivessel coronary artery atherosclerosis. Aortic atherosclerosis. Left Port-A-Cath tip low SVC. Centrilobular and paraseptal emphysema. ABDOMEN/PELVIS: Hypermetabolic high left hepatic lobe 11 mm lesion on image 102/4 measures a S.U.V. max of 10.0. A vague area of more mild right hepatic lobe hypermetabolism is without well-defined CT correlate and measures a S.U.V. max of 4.5, including on approximately image 108/4. No abdominopelvic nodal hypermetabolism. Likely physiologic anal hypermetabolism without CT correlate. This measures a S.U.V. max of 6.9. Incidental CT findings: Abdominal aortic and branch vessel atherosclerosis. Large colonic stool burden. SKELETON: Development of multifocal osseous metastasis. Examples within the right side of the C2 vertebral body and anterior right femoral  neck. Index lesion within the L4 vertebral body measures a S.U.V. max of 16.3. Incidental CT findings: none IMPRESSION: 1. Since the PET of 08/21/2018, mixed response to therapy. 2. The right paratracheal and right hilar nodal hypermetabolism has resolved. However, there has been interval development of hepatic and osseous metastasis, as well as a new hypermetabolic node at the level of the thoracic inlet. 3. Foci of right-sided pleural based hypermetabolism are indeterminate but suspicious for pleural metastasis. 4. Aortic atherosclerosis (ICD10-I70.0), coronary artery atherosclerosis and emphysema (ICD10-J43.9). 5. Right upper lobe hypermetabolic pulmonary opacity, favored to represent an area of infection or inflammation. Electronically Signed   By: Jeronimo Greaves M.D.   On: 05/03/2019 09:24     Impression/Plan: 1. 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  The patient is being counseled on the rationale for palliative intervention with systemic therapy.  He anticipates beginning this in the next week or 2.  We discussed the rationale to consider palliative radiotherapy for symptomatic bone metastases, and Dr. Mitzi Hansen would recommend radiotherapy to the L4 vertebral body.  He would anticipate a course of 3 weeks of treatment due to his ongoing plans for chemotherapy.  We discussed the risks, benefits, short and long-term effects of treatment, and the patient is interested in moving forward.  We will simulate this morning.  Written consent is obtained and placed in the chart as well as a copy given to the patient.  Regarding his previous brain treatment, he is due for MRI of the brain next week, but also on for PEG replacement, and we will reschedule his brain MRI and we will review his imaging and discuss in conference.  We will follow-up with these results when available. Also regarding his other bony disease in C2 and right proximal femur are very small and do not appear to require radiotherapy, and would follow this after he moves forward with systemic therapy. Written consent is obtained and placed in the chart, a copy was provided to the patient. He will simulate today. 2. Back pain. The patient has management of his pain regimen with [facility]. 3. Renal insufficiency. He will continue to be followed with Dr. [name] as he moves forward with systemic therapy.  This encounter was provided by telemedicine platform Webex.  The patient has given verbal consent for this type of encounter and has been advised to only accept a meeting of this type in a secure network environment. The time spent during this encounter was 45 minutes. The attendants for this meeting include Rober Minion, RN, Dr.  Mitzi Hansen, Ronny Bacon  and Carola Frost.  During the encounter,  Rober Minion, RN, Dr. Mitzi Hansen, and Ronny Bacon were located at [facility] Radiation Oncology Department.  Aniceto Boss Cullimore was located at home.   The above documentation reflects my direct findings during this shared patient visit. Please see the separate note by Dr. Mitzi Hansen on this date for the remainder of the patient's plan of care.     Osker Mason, PAC

## 2019-05-10 NOTE — Telephone Encounter (Signed)
Scheduled appt per 6/15 los. Left a voice message of scheduled appt date and time.

## 2019-05-10 NOTE — Telephone Encounter (Signed)
Nutrition  RD working remotely.  Received call from patient that feeding tube will be placed on Monday, June 22.    Intervention: Reviewed with patient tube feeding regimen.  Recommend starting with 35ml water flush, then 1/2 carton of Two Cal and 37ml water flush 4 times per day on day one.  If tolerating can titrate to full carton on day 3.  Will add promod once tolerating tube feeding formula.  Patient verbalized understanding.  Patient reports that he would like home health nurse teaching again to review PEG care.  RD spoke with RN, Diane.  Diane to place home health nursing referral for PEG care and teaching.   RD has communicated with Thedore Mins, [place] representative, referral for tube feeding formula and supplies.    Next visit: phone f/u Tuesday, June 23  [place] Zenia Resides, [place], [place] Registered Dietitian [phone] (pager)

## 2019-05-10 NOTE — Progress Notes (Signed)
Patient reports pain in lower back and both legs, pain score of 7.  Pt has mild fatigue.  Pt reports nausea, medicine is helping. No further complaints at this time   Weight 163 lb (73.9 kg).

## 2019-05-10 NOTE — Telephone Encounter (Signed)
Dietician will fax feeding tube orders for [facility] to sign . I called in referral for home health to feeding tube education and maintenance

## 2019-05-11 ENCOUNTER — Telehealth: Payer: Self-pay | Admitting: Medical Oncology

## 2019-05-11 NOTE — Telephone Encounter (Signed)
Pain management- I took one at 5am, then a second one 1030. I usually take  3 pain tablets/day, but I may need to take 4 tablets /day. I told him he can take one every 6 hours which is 4/day . " I just want him to know I am not abusing them" Pt instructed to call if pain not managed.

## 2019-05-14 ENCOUNTER — Telehealth: Payer: Self-pay

## 2019-05-14 ENCOUNTER — Encounter (HOSPITAL_COMMUNITY): Payer: Self-pay

## 2019-05-14 ENCOUNTER — Ambulatory Visit (HOSPITAL_COMMUNITY): Payer: Medicare Other

## 2019-05-14 ENCOUNTER — Ambulatory Visit (HOSPITAL_COMMUNITY)
Admission: RE | Admit: 2019-05-14 | Discharge: 2019-05-14 | Disposition: A | Discharge status: Home / Self Care | Payer: Medicare Other | Source: Ambulatory Visit | Attending: Physician Assistant | Admitting: Physician Assistant

## 2019-05-14 ENCOUNTER — Other Ambulatory Visit: Payer: Self-pay

## 2019-05-14 ENCOUNTER — Other Ambulatory Visit: Payer: Self-pay | Admitting: Radiation Therapy

## 2019-05-14 DIAGNOSIS — C3411 Malignant neoplasm of upper lobe, right bronchus or lung: Secondary | ICD-10-CM | POA: Insufficient documentation

## 2019-05-14 DIAGNOSIS — Z539 Procedure and treatment not carried out, unspecified reason: Secondary | ICD-10-CM | POA: Diagnosis not present

## 2019-05-14 DIAGNOSIS — E43 Unspecified severe protein-calorie malnutrition: Secondary | ICD-10-CM | POA: Insufficient documentation

## 2019-05-14 LAB — CBC
HCT: 29.2 % — ABNORMAL LOW (ref 39.0–52.0)
Hemoglobin: 9.5 g/dL — ABNORMAL LOW (ref 13.0–17.0)
MCH: 28.6 pg (ref 26.0–34.0)
MCHC: 32.5 g/dL (ref 30.0–36.0)
MCV: 88 fL (ref 80.0–100.0)
Platelets: 192 10*3/uL (ref 150–400)
RBC: 3.32 MIL/uL — ABNORMAL LOW (ref 4.22–5.81)
RDW: 13.7 % (ref 11.5–15.5)
WBC: 4.4 10*3/uL (ref 4.0–10.5)
nRBC: 0 % (ref 0.0–0.2)

## 2019-05-14 LAB — BASIC METABOLIC PANEL
Anion gap: 10 (ref 5–15)
BUN: 17 mg/dL (ref 6–20)
CO2: 26 mmol/L (ref 22–32)
Calcium: 8.6 mg/dL — ABNORMAL LOW (ref 8.9–10.3)
Chloride: 108 mmol/L (ref 98–111)
Creatinine, Ser: 2.11 mg/dL — ABNORMAL HIGH (ref 0.61–1.24)
GFR calc Af Amer: 40 mL/min — ABNORMAL LOW (ref >60)
GFR calc non Af Amer: 35 mL/min — ABNORMAL LOW (ref >60)
Glucose, Bld: 77 mg/dL (ref 70–99)
Potassium: 4.4 mmol/L (ref 3.5–5.1)
Sodium: 144 mmol/L (ref 135–145)

## 2019-05-14 LAB — GLUCOSE, CAPILLARY: Glucose-Capillary: 75 mg/dL (ref 70–99)

## 2019-05-14 MED ORDER — CEFAZOLIN SODIUM-DEXTROSE 2-4 GM/100ML-% IV SOLN: 2.0000 g | INTRAVENOUS | Status: DC

## 2019-05-14 MED ORDER — SODIUM CHLORIDE 0.9 % IV SOLN: INTRAVENOUS | Status: DC

## 2019-05-14 NOTE — Progress Notes (Signed)
CBG [facility] Jannifer Franklin, PA informed

## 2019-05-14 NOTE — Progress Notes (Signed)
Patient ID: Andrew West, male   DOB: 1966-08-10, 53 y.o.   MRN: [id number]   Was scheduled for Gastric tube placement today in IR  Known to IR Lung Cancer- dysphagia/wt loss G tube was placed 11/2018 and removed 02/2019 Pt was eating and doing well at that time  Now new wt loss; loss of appetite Dysphagia Malnutrition Scheduled for new Gastric tube placement  Pt is followed by Dr Julien Nordmann for lung cancer treatment All meds are through Dr Julien Nordmann including pain meds  Pt is on Plavix for cardiac stents LD 05/11/19 Must be off 5 days before new percutaneous placement of gastric tube  Rescheduled to 6/25 Thursday To continue to HOLD Plavix Will see pt again on 6/25 To be at [facility] 1030 am  No need for new blood work But will repeat Covid test stat same day Pt is to Quarantine till then  He is aware of plan and has good understanding

## 2019-05-14 NOTE — Telephone Encounter (Signed)
Contacted patient to verify telephone visit for pre reg °

## 2019-05-14 NOTE — Progress Notes (Signed)
Procedure was cancelled due to not being off Plavix long enough. Pt will need Covid testing when he comes back for procedure.  Pt was informed by Jannifer Franklin, PA

## 2019-05-15 ENCOUNTER — Inpatient Hospital Stay: Payer: Medicare Other

## 2019-05-15 ENCOUNTER — Telehealth: Payer: Self-pay | Admitting: Medical Oncology

## 2019-05-15 DIAGNOSIS — E785 Hyperlipidemia, unspecified: Secondary | ICD-10-CM | POA: Diagnosis not present

## 2019-05-15 DIAGNOSIS — Z79891 Long term (current) use of opiate analgesic: Secondary | ICD-10-CM | POA: Diagnosis not present

## 2019-05-15 DIAGNOSIS — Z431 Encounter for attention to gastrostomy: Secondary | ICD-10-CM | POA: Diagnosis not present

## 2019-05-15 DIAGNOSIS — E43 Unspecified severe protein-calorie malnutrition: Secondary | ICD-10-CM | POA: Diagnosis not present

## 2019-05-15 DIAGNOSIS — Z510 Encounter for antineoplastic radiation therapy: Secondary | ICD-10-CM | POA: Diagnosis not present

## 2019-05-15 DIAGNOSIS — I2510 Atherosclerotic heart disease of native coronary artery without angina pectoris: Secondary | ICD-10-CM | POA: Diagnosis not present

## 2019-05-15 DIAGNOSIS — Z20828 Contact with and (suspected) exposure to other viral communicable diseases: Secondary | ICD-10-CM | POA: Diagnosis not present

## 2019-05-15 DIAGNOSIS — I10 Essential (primary) hypertension: Secondary | ICD-10-CM | POA: Diagnosis not present

## 2019-05-15 DIAGNOSIS — C7951 Secondary malignant neoplasm of bone: Secondary | ICD-10-CM | POA: Diagnosis not present

## 2019-05-15 DIAGNOSIS — Z87891 Personal history of nicotine dependence: Secondary | ICD-10-CM | POA: Diagnosis not present

## 2019-05-15 DIAGNOSIS — C3411 Malignant neoplasm of upper lobe, right bronchus or lung: Secondary | ICD-10-CM | POA: Diagnosis not present

## 2019-05-15 DIAGNOSIS — E119 Type 2 diabetes mellitus without complications: Secondary | ICD-10-CM | POA: Diagnosis not present

## 2019-05-15 DIAGNOSIS — C787 Secondary malignant neoplasm of liver and intrahepatic bile duct: Secondary | ICD-10-CM | POA: Diagnosis not present

## 2019-05-15 DIAGNOSIS — Z7982 Long term (current) use of aspirin: Secondary | ICD-10-CM | POA: Diagnosis not present

## 2019-05-15 DIAGNOSIS — C3401 Malignant neoplasm of right main bronchus: Secondary | ICD-10-CM | POA: Diagnosis not present

## 2019-05-15 DIAGNOSIS — F329 Major depressive disorder, single episode, unspecified: Secondary | ICD-10-CM | POA: Diagnosis not present

## 2019-05-15 DIAGNOSIS — H449 Unspecified disorder of globe: Secondary | ICD-10-CM | POA: Diagnosis not present

## 2019-05-15 NOTE — Telephone Encounter (Signed)
Skilled nursing visits requested- 1 x week x 2 weeks, then 1 x week for 8 weeks with 3 prn visits for Diagnosis and medication education, feeding tube maintenance education. Okay per [place].

## 2019-05-15 NOTE — Progress Notes (Signed)
Nutrition  Called patient for nutrition follow-up.  No answer.  Left message for patient to call back.  Noted patient did not received PEG yesterday and rescheduled for 6/25.    Oletta Buehring B. Zenia Resides, [place], [place] Registered Dietitian [phone] (pager)

## 2019-05-16 ENCOUNTER — Inpatient Hospital Stay: Payer: Medicare Other

## 2019-05-16 ENCOUNTER — Ambulatory Visit
Admission: RE | Admit: 2019-05-16 | Discharge: 2019-05-16 | Disposition: A | Discharge status: Home / Self Care | Payer: Medicare Other | Source: Ambulatory Visit | Attending: Radiation Oncology | Admitting: Radiation Oncology

## 2019-05-16 ENCOUNTER — Other Ambulatory Visit: Payer: Self-pay | Admitting: Student

## 2019-05-16 ENCOUNTER — Ambulatory Visit: Payer: Medicare Other | Admitting: Physician Assistant

## 2019-05-16 ENCOUNTER — Other Ambulatory Visit: Payer: Self-pay | Admitting: Radiology

## 2019-05-16 ENCOUNTER — Other Ambulatory Visit: Payer: Self-pay

## 2019-05-16 VITALS — BP 119/88 | HR 82 | Temp 98.2°F | Resp 16 | Wt 162.5 lb

## 2019-05-16 DIAGNOSIS — R1012 Left upper quadrant pain: Secondary | ICD-10-CM | POA: Diagnosis not present

## 2019-05-16 DIAGNOSIS — F419 Anxiety disorder, unspecified: Secondary | ICD-10-CM | POA: Diagnosis not present

## 2019-05-16 DIAGNOSIS — E785 Hyperlipidemia, unspecified: Secondary | ICD-10-CM | POA: Diagnosis not present

## 2019-05-16 DIAGNOSIS — J948 Other specified pleural conditions: Secondary | ICD-10-CM | POA: Diagnosis not present

## 2019-05-16 DIAGNOSIS — Z95828 Presence of other vascular implants and grafts: Secondary | ICD-10-CM

## 2019-05-16 DIAGNOSIS — C3411 Malignant neoplasm of upper lobe, right bronchus or lung: Secondary | ICD-10-CM

## 2019-05-16 DIAGNOSIS — I2510 Atherosclerotic heart disease of native coronary artery without angina pectoris: Secondary | ICD-10-CM | POA: Diagnosis not present

## 2019-05-16 DIAGNOSIS — G4730 Sleep apnea, unspecified: Secondary | ICD-10-CM | POA: Diagnosis not present

## 2019-05-16 DIAGNOSIS — Z510 Encounter for antineoplastic radiation therapy: Secondary | ICD-10-CM | POA: Diagnosis not present

## 2019-05-16 DIAGNOSIS — I700 Atherosclerosis of aorta: Secondary | ICD-10-CM | POA: Diagnosis not present

## 2019-05-16 DIAGNOSIS — R634 Abnormal weight loss: Secondary | ICD-10-CM | POA: Diagnosis not present

## 2019-05-16 DIAGNOSIS — R630 Anorexia: Secondary | ICD-10-CM | POA: Diagnosis not present

## 2019-05-16 DIAGNOSIS — R5382 Chronic fatigue, unspecified: Secondary | ICD-10-CM

## 2019-05-16 DIAGNOSIS — C7951 Secondary malignant neoplasm of bone: Secondary | ICD-10-CM | POA: Diagnosis not present

## 2019-05-16 DIAGNOSIS — I10 Essential (primary) hypertension: Secondary | ICD-10-CM | POA: Diagnosis not present

## 2019-05-16 DIAGNOSIS — C787 Secondary malignant neoplasm of liver and intrahepatic bile duct: Secondary | ICD-10-CM | POA: Diagnosis not present

## 2019-05-16 DIAGNOSIS — E860 Dehydration: Secondary | ICD-10-CM | POA: Diagnosis not present

## 2019-05-16 DIAGNOSIS — R0789 Other chest pain: Secondary | ICD-10-CM | POA: Diagnosis not present

## 2019-05-16 DIAGNOSIS — F329 Major depressive disorder, single episode, unspecified: Secondary | ICD-10-CM | POA: Diagnosis not present

## 2019-05-16 DIAGNOSIS — I6523 Occlusion and stenosis of bilateral carotid arteries: Secondary | ICD-10-CM | POA: Diagnosis not present

## 2019-05-16 DIAGNOSIS — Z20828 Contact with and (suspected) exposure to other viral communicable diseases: Secondary | ICD-10-CM | POA: Diagnosis not present

## 2019-05-16 DIAGNOSIS — Z7982 Long term (current) use of aspirin: Secondary | ICD-10-CM | POA: Diagnosis not present

## 2019-05-16 DIAGNOSIS — J449 Chronic obstructive pulmonary disease, unspecified: Secondary | ICD-10-CM | POA: Diagnosis not present

## 2019-05-16 DIAGNOSIS — Z79899 Other long term (current) drug therapy: Secondary | ICD-10-CM | POA: Diagnosis not present

## 2019-05-16 DIAGNOSIS — K219 Gastro-esophageal reflux disease without esophagitis: Secondary | ICD-10-CM | POA: Diagnosis not present

## 2019-05-16 LAB — CMP (CANCER CENTER ONLY)
ALT: 9 U/L (ref 0–44)
AST: 18 U/L (ref 15–41)
Albumin: 3.3 g/dL — ABNORMAL LOW (ref 3.5–5.0)
Alkaline Phosphatase: 62 U/L (ref 38–126)
Anion gap: 11 (ref 5–15)
BUN: 16 mg/dL (ref 6–20)
CO2: 26 mmol/L (ref 22–32)
Calcium: 8.8 mg/dL — ABNORMAL LOW (ref 8.9–10.3)
Chloride: 108 mmol/L (ref 98–111)
Creatinine: 1.94 mg/dL — ABNORMAL HIGH (ref 0.61–1.24)
GFR, Est AFR Am: 45 mL/min — ABNORMAL LOW (ref >60)
GFR, Estimated: 39 mL/min — ABNORMAL LOW (ref >60)
Glucose, Bld: 77 mg/dL (ref 70–99)
Potassium: 3.8 mmol/L (ref 3.5–5.1)
Sodium: 145 mmol/L (ref 135–145)
Total Bilirubin: 0.4 mg/dL (ref 0.3–1.2)
Total Protein: 6.9 g/dL (ref 6.5–8.1)

## 2019-05-16 LAB — CBC WITH DIFFERENTIAL (CANCER CENTER ONLY)
Abs Immature Granulocytes: 0.01 10*3/uL (ref 0.00–0.07)
Basophils Absolute: 0 10*3/uL (ref 0.0–0.1)
Basophils Relative: 1 %
Eosinophils Absolute: 0.1 10*3/uL (ref 0.0–0.5)
Eosinophils Relative: 2 %
HCT: 30.1 % — ABNORMAL LOW (ref 39.0–52.0)
Hemoglobin: 9.6 g/dL — ABNORMAL LOW (ref 13.0–17.0)
Immature Granulocytes: 0 %
Lymphocytes Relative: 10 %
Lymphs Abs: 0.6 10*3/uL — ABNORMAL LOW (ref 0.7–4.0)
MCH: 27.5 pg (ref 26.0–34.0)
MCHC: 31.9 g/dL (ref 30.0–36.0)
MCV: 86.2 fL (ref 80.0–100.0)
Monocytes Absolute: 0.6 10*3/uL (ref 0.1–1.0)
Monocytes Relative: 10 %
Neutro Abs: 4.3 10*3/uL (ref 1.7–7.7)
Neutrophils Relative %: 77 %
Platelet Count: 148 10*3/uL — ABNORMAL LOW (ref 150–400)
RBC: 3.49 MIL/uL — ABNORMAL LOW (ref 4.22–5.81)
RDW: 13.6 % (ref 11.5–15.5)
WBC Count: 5.5 10*3/uL (ref 4.0–10.5)
nRBC: 0 % (ref 0.0–0.2)

## 2019-05-16 LAB — TSH: TSH: 1.207 u[IU]/mL (ref 0.320–4.118)

## 2019-05-16 LAB — MAGNESIUM: Magnesium: 1.6 mg/dL — ABNORMAL LOW (ref 1.7–2.4)

## 2019-05-16 MED ORDER — SODIUM CHLORIDE 0.9 % IV SOLN
1500.0000 mg | Freq: Once | INTRAVENOUS | Status: AC
  Administered 2019-05-16: 1500 mg via INTRAVENOUS
  Filled 2019-05-16: qty 30

## 2019-05-16 MED ORDER — HEPARIN SOD (PORK) LOCK FLUSH 100 UNIT/ML IV SOLN
500.0000 [IU] | Freq: Once | INTRAVENOUS | Status: AC | PRN
  Administered 2019-05-16: 500 [IU]
  Filled 2019-05-16: qty 5

## 2019-05-16 MED ORDER — PALONOSETRON HCL INJECTION 0.25 MG/5ML
0.2500 mg | Freq: Once | INTRAVENOUS | Status: AC
  Administered 2019-05-16: 0.25 mg via INTRAVENOUS

## 2019-05-16 MED ORDER — OXYCODONE-ACETAMINOPHEN 5-325 MG PO TABS
ORAL_TABLET | ORAL | Status: AC
  Filled 2019-05-16: qty 1

## 2019-05-16 MED ORDER — PALONOSETRON HCL INJECTION 0.25 MG/5ML
INTRAVENOUS | Status: AC
  Filled 2019-05-16: qty 5

## 2019-05-16 MED ORDER — SODIUM CHLORIDE 0.9 % IV SOLN
Freq: Once | INTRAVENOUS | Status: AC
  Administered 2019-05-16: 14:00:00 via INTRAVENOUS
  Filled 2019-05-16: qty 250

## 2019-05-16 MED ORDER — SODIUM CHLORIDE 0.9% FLUSH
10.0000 mL | INTRAVENOUS | Status: DC | PRN
  Administered 2019-05-16: 10 mL
  Filled 2019-05-16: qty 10

## 2019-05-16 MED ORDER — SODIUM CHLORIDE 0.9 % IV SOLN
100.0000 mg/m2 | Freq: Once | INTRAVENOUS | Status: AC
  Administered 2019-05-16: 190 mg via INTRAVENOUS
  Filled 2019-05-16: qty 9.5

## 2019-05-16 MED ORDER — OXYCODONE-ACETAMINOPHEN 5-325 MG PO TABS
1.0000 | ORAL_TABLET | Freq: Once | ORAL | Status: AC
  Administered 2019-05-16: 1 via ORAL

## 2019-05-16 MED ORDER — SODIUM CHLORIDE 0.9 % IV SOLN
349.5000 mg | Freq: Once | INTRAVENOUS | Status: AC
  Administered 2019-05-16: 350 mg via INTRAVENOUS
  Filled 2019-05-16: qty 35

## 2019-05-16 MED ORDER — SODIUM CHLORIDE 0.9 % IV SOLN
Freq: Once | INTRAVENOUS | Status: AC
  Administered 2019-05-16: 14:00:00 via INTRAVENOUS
  Filled 2019-05-16: qty 5

## 2019-05-16 MED ORDER — HEPARIN SOD (PORK) LOCK FLUSH 100 UNIT/ML IV SOLN
500.0000 [IU] | Freq: Once | INTRAVENOUS | Status: AC | PRN
  Administered 2019-05-16: 09:00:00 500 [IU]
  Filled 2019-05-16: qty 5

## 2019-05-16 NOTE — Progress Notes (Signed)
OK to treat with today's creatinine 1.94 per Cassie [name], PA

## 2019-05-16 NOTE — Patient Instructions (Signed)
[facility] Discharge Instructions for Patients Receiving Chemotherapy  Today you received the following chemotherapy agents Imfinzi, Carboplatin, Etoposide  To help prevent nausea and vomiting after your treatment, we encourage you to take your nausea medication as directed.   If you develop nausea and vomiting that is not controlled by your nausea medication, call the clinic.   BELOW ARE SYMPTOMS THAT SHOULD BE REPORTED IMMEDIATELY:  *FEVER GREATER THAN 100.5 F  *CHILLS WITH OR WITHOUT FEVER  NAUSEA AND VOMITING THAT IS NOT CONTROLLED WITH YOUR NAUSEA MEDICATION  *UNUSUAL SHORTNESS OF BREATH  *UNUSUAL BRUISING OR BLEEDING  TENDERNESS IN MOUTH AND THROAT WITH OR WITHOUT PRESENCE OF ULCERS  *URINARY PROBLEMS  *BOWEL PROBLEMS  UNUSUAL RASH Items with * indicate a potential emergency and should be followed up as soon as possible.  Feel free to call the clinic should you have any questions or concerns. The clinic phone number is [phone].  Please show the [facility] at check-in to the Emergency Department and triage nurse.  Durvalumab injection (Imfinzi) What is this medicine? DURVALUMAB (dur VAL ue mab) is a monoclonal antibody. It is used to treat urothelial cancer and lung cancer. This medicine may be used for other purposes; ask your health care provider or pharmacist if you have questions. COMMON BRAND NAME(S): IMFINZI What should I tell my health care provider before I take this medicine? They need to know if you have any of these conditions: -diabetes -immune system problems -infection -inflammatory bowel disease -kidney disease -liver disease -lung or breathing disease -lupus -organ transplant -stomach or intestine problems -thyroid disease -an unusual or allergic reaction to durvalumab, other medicines, foods, dyes, or preservatives -pregnant or trying to get pregnant -breast-feeding How should I use this medicine? This medicine  is for infusion into a vein. It is given by a health care professional in a hospital or clinic setting. A special MedGuide will be given to you before each treatment. Be sure to read this information carefully each time. Talk to your pediatrician regarding the use of this medicine in children. Special care may be needed. Overdosage: If you think you have taken too much of this medicine contact a poison control center or emergency room at once. NOTE: This medicine is only for you. Do not share this medicine with others. What if I miss a dose? It is important not to miss your dose. Call your doctor or health care professional if you are unable to keep an appointment. What may interact with this medicine? Interactions have not been studied. This list may not describe all possible interactions. Give your health care provider a list of all the medicines, herbs, non-prescription drugs, or dietary supplements you use. Also tell them if you smoke, drink alcohol, or use illegal drugs. Some items may interact with your medicine. What should I watch for while using this medicine? This drug may make you feel generally unwell. Continue your course of treatment even though you feel ill unless your doctor tells you to stop. You may need blood work done while you are taking this medicine. Do not become pregnant while taking this medicine or for 3 months after stopping it. Women should inform their doctor if they wish to become pregnant or think they might be pregnant. There is a potential for serious side effects to an unborn child. Talk to your health care professional or pharmacist for more information. Do not breast-feed an infant while taking this medicine or for 3 months after  stopping it. What side effects may I notice from receiving this medicine? Side effects that you should report to your doctor or health care professional as soon as possible: -allergic reactions like skin rash, itching or hives, swelling  of the face, lips, or tongue -black, tarry stools -bloody or watery diarrhea -breathing problems -change in emotions or moods -change in sex drive -changes in vision -chest pain or chest tightness -chills -confusion -cough -facial flushing -fever -headache -signs and symptoms of high blood sugar such as dizziness; dry mouth; dry skin; fruity breath; nausea; stomach pain; increased hunger or thirst; increased urination -signs and symptoms of liver injury like dark yellow or brown urine; general ill feeling or flu-like symptoms; light-colored stools; loss of appetite; nausea; right upper belly pain; unusually weak or tired; yellowing of the eyes or skin -stomach pain -trouble passing urine or change in the amount of urine -weight gain or weight loss Side effects that usually do not require medical attention (report these to your doctor or health care professional if they continue or are bothersome): -bone pain -constipation -loss of appetite -muscle pain -nausea -swelling of the ankles, feet, hands -tiredness This list may not describe all possible side effects. Call your doctor for medical advice about side effects. You may report side effects to FDA at 1-800-FDA-1088. Where should I keep my medicine? This drug is given in a hospital or clinic and will not be stored at home. NOTE: This sheet is a summary. It may not cover all possible information. If you have questions about this medicine, talk to your doctor, pharmacist, or health care provider.  2019 Elsevier/Gold Standard (2017-01-18 19:25:04)  Carboplatin injection What is this medicine? CARBOPLATIN (KAR boe pla tin) is a chemotherapy drug. It targets fast dividing cells, like cancer cells, and causes these cells to die. This medicine is used to treat ovarian cancer and many other cancers. This medicine may be used for other purposes; ask your health care provider or pharmacist if you have questions. COMMON BRAND NAME(S):  Paraplatin What should I tell my health care provider before I take this medicine? They need to know if you have any of these conditions: -blood disorders -hearing problems -kidney disease -recent or ongoing radiation therapy -an unusual or allergic reaction to carboplatin, cisplatin, other chemotherapy, other medicines, foods, dyes, or preservatives -pregnant or trying to get pregnant -breast-feeding How should I use this medicine? This drug is usually given as an infusion into a vein. It is administered in a hospital or clinic by a specially trained health care professional. Talk to your pediatrician regarding the use of this medicine in children. Special care may be needed. Overdosage: If you think you have taken too much of this medicine contact a poison control center or emergency room at once. NOTE: This medicine is only for you. Do not share this medicine with others. What if I miss a dose? It is important not to miss a dose. Call your doctor or health care professional if you are unable to keep an appointment. What may interact with this medicine? -medicines for seizures -medicines to increase blood counts like filgrastim, pegfilgrastim, sargramostim -some antibiotics like amikacin, gentamicin, neomycin, streptomycin, tobramycin -vaccines Talk to your doctor or health care professional before taking any of these medicines: -acetaminophen -aspirin -ibuprofen -ketoprofen -naproxen This list may not describe all possible interactions. Give your health care provider a list of all the medicines, herbs, non-prescription drugs, or dietary supplements you use. Also tell them if you smoke, drink  alcohol, or use illegal drugs. Some items may interact with your medicine. What should I watch for while using this medicine? Your condition will be monitored carefully while you are receiving this medicine. You will need important blood work done while you are taking this medicine. This drug  may make you feel generally unwell. This is not uncommon, as chemotherapy can affect healthy cells as well as cancer cells. Report any side effects. Continue your course of treatment even though you feel ill unless your doctor tells you to stop. In some cases, you may be given additional medicines to help with side effects. Follow all directions for their use. Call your doctor or health care professional for advice if you get a fever, chills or sore throat, or other symptoms of a cold or flu. Do not treat yourself. This drug decreases your body's ability to fight infections. Try to avoid being around people who are sick. This medicine may increase your risk to bruise or bleed. Call your doctor or health care professional if you notice any unusual bleeding. Be careful brushing and flossing your teeth or using a toothpick because you may get an infection or bleed more easily. If you have any dental work done, tell your dentist you are receiving this medicine. Avoid taking products that contain aspirin, acetaminophen, ibuprofen, naproxen, or ketoprofen unless instructed by your doctor. These medicines may hide a fever. Do not become pregnant while taking this medicine. Women should inform their doctor if they wish to become pregnant or think they might be pregnant. There is a potential for serious side effects to an unborn child. Talk to your health care professional or pharmacist for more information. Do not breast-feed an infant while taking this medicine. What side effects may I notice from receiving this medicine? Side effects that you should report to your doctor or health care professional as soon as possible: -allergic reactions like skin rash, itching or hives, swelling of the face, lips, or tongue -signs of infection - fever or chills, cough, sore throat, pain or difficulty passing urine -signs of decreased platelets or bleeding - bruising, pinpoint red spots on the skin, black, tarry stools,  nosebleeds -signs of decreased red blood cells - unusually weak or tired, fainting spells, lightheadedness -breathing problems -changes in hearing -changes in vision -chest pain -high blood pressure -low blood counts - This drug may decrease the number of white blood cells, red blood cells and platelets. You may be at increased risk for infections and bleeding. -nausea and vomiting -pain, swelling, redness or irritation at the injection site -pain, tingling, numbness in the hands or feet -problems with balance, talking, walking -trouble passing urine or change in the amount of urine Side effects that usually do not require medical attention (report to your doctor or health care professional if they continue or are bothersome): -hair loss -loss of appetite -metallic taste in the mouth or changes in taste This list may not describe all possible side effects. Call your doctor for medical advice about side effects. You may report side effects to FDA at 1-800-FDA-1088. Where should I keep my medicine? This drug is given in a hospital or clinic and will not be stored at home. NOTE: This sheet is a summary. It may not cover all possible information. If you have questions about this medicine, talk to your doctor, pharmacist, or health care provider.  2019 Elsevier/Gold Standard (2008-02-13 14:38:05)  Etoposide, VP-16 injection What is this medicine? ETOPOSIDE, VP-16 (e toe POE side) is  a chemotherapy drug. It is used to treat testicular cancer, lung cancer, and other cancers. This medicine may be used for other purposes; ask your health care provider or pharmacist if you have questions. COMMON BRAND NAME(S): Etopophos, Toposar, VePesid What should I tell my health care provider before I take this medicine? They need to know if you have any of these conditions: -infection -kidney disease -liver disease -low blood counts, like low white cell, platelet, or red cell counts -an unusual or  allergic reaction to etoposide, other medicines, foods, dyes, or preservatives -pregnant or trying to get pregnant -breast-feeding How should I use this medicine? This medicine is for infusion into a vein. It is administered in a hospital or clinic by a specially trained health care professional. Talk to your pediatrician regarding the use of this medicine in children. Special care may be needed. Overdosage: If you think you have taken too much of this medicine contact a poison control center or emergency room at once. NOTE: This medicine is only for you. Do not share this medicine with others. What if I miss a dose? It is important not to miss your dose. Call your doctor or health care professional if you are unable to keep an appointment. What may interact with this medicine? -aspirin -certain medications for seizures like carbamazepine, phenobarbital, phenytoin, valproic acid -cyclosporine -levamisole -warfarin This list may not describe all possible interactions. Give your health care provider a list of all the medicines, herbs, non-prescription drugs, or dietary supplements you use. Also tell them if you smoke, drink alcohol, or use illegal drugs. Some items may interact with your medicine. What should I watch for while using this medicine? Visit your doctor for checks on your progress. This drug may make you feel generally unwell. This is not uncommon, as chemotherapy can affect healthy cells as well as cancer cells. Report any side effects. Continue your course of treatment even though you feel ill unless your doctor tells you to stop. In some cases, you may be given additional medicines to help with side effects. Follow all directions for their use. Call your doctor or health care professional for advice if you get a fever, chills or sore throat, or other symptoms of a cold or flu. Do not treat yourself. This drug decreases your body's ability to fight infections. Try to avoid being  around people who are sick. This medicine may increase your risk to bruise or bleed. Call your doctor or health care professional if you notice any unusual bleeding. Talk to your doctor about your risk of cancer. You may be more at risk for certain types of cancers if you take this medicine. Do not become pregnant while taking this medicine or for at least 6 months after stopping it. Women should inform their doctor if they wish to become pregnant or think they might be pregnant. Women of child-bearing potential will need to have a negative pregnancy test before starting this medicine. There is a potential for serious side effects to an unborn child. Talk to your health care professional or pharmacist for more information. Do not breast-feed an infant while taking this medicine. Men must use a latex condom during sexual contact with a woman while taking this medicine and for at least 4 months after stopping it. A latex condom is needed even if you have had a vasectomy. Contact your doctor right away if your partner becomes pregnant. Do not donate sperm while taking this medicine and for at least 4 months  after you stop taking this medicine. Men should inform their doctors if they wish to father a child. This medicine may lower sperm counts. What side effects may I notice from receiving this medicine? Side effects that you should report to your doctor or health care professional as soon as possible: -allergic reactions like skin rash, itching or hives, swelling of the face, lips, or tongue -low blood counts - this medicine may decrease the number of white blood cells, red blood cells and platelets. You may be at increased risk for infections and bleeding. -signs of infection - fever or chills, cough, sore throat, pain or difficulty passing urine -signs of decreased platelets or bleeding - bruising, pinpoint red spots on the skin, black, tarry stools, blood in the urine -signs of decreased red blood cells -  unusually weak or tired, fainting spells, lightheadedness -breathing problems -changes in vision -mouth or throat sores or ulcers -pain, redness, swelling or irritation at the injection site -pain, tingling, numbness in the hands or feet -redness, blistering, peeling or loosening of the skin, including inside the mouth -seizures -vomiting Side effects that usually do not require medical attention (report to your doctor or health care professional if they continue or are bothersome): -diarrhea -hair loss -loss of appetite -nausea -stomach pain This list may not describe all possible side effects. Call your doctor for medical advice about side effects. You may report side effects to FDA at 1-800-FDA-1088. Where should I keep my medicine? This drug is given in a hospital or clinic and will not be stored at home. NOTE: This sheet is a summary. It may not cover all possible information. If you have questions about this medicine, talk to your doctor, pharmacist, or health care provider.  2019 Elsevier/Gold Standard (2015-10-31 11:53:23)

## 2019-05-17 ENCOUNTER — Other Ambulatory Visit: Payer: Self-pay

## 2019-05-17 ENCOUNTER — Ambulatory Visit: Payer: Medicare Other

## 2019-05-17 ENCOUNTER — Encounter (HOSPITAL_COMMUNITY): Payer: Self-pay

## 2019-05-17 ENCOUNTER — Inpatient Hospital Stay: Payer: Medicare Other

## 2019-05-17 ENCOUNTER — Telehealth: Payer: Self-pay | Admitting: *Deleted

## 2019-05-17 ENCOUNTER — Ambulatory Visit (HOSPITAL_COMMUNITY)
Admission: RE | Admit: 2019-05-17 | Discharge: 2019-05-17 | Disposition: A | Discharge status: Home / Self Care | Payer: Medicare Other | Source: Ambulatory Visit | Attending: Physician Assistant | Admitting: Physician Assistant

## 2019-05-17 VITALS — BP 117/70 | HR 73 | Temp 97.7°F | Resp 16

## 2019-05-17 DIAGNOSIS — Z87891 Personal history of nicotine dependence: Secondary | ICD-10-CM | POA: Diagnosis not present

## 2019-05-17 DIAGNOSIS — J948 Other specified pleural conditions: Secondary | ICD-10-CM | POA: Diagnosis not present

## 2019-05-17 DIAGNOSIS — C3411 Malignant neoplasm of upper lobe, right bronchus or lung: Secondary | ICD-10-CM | POA: Diagnosis not present

## 2019-05-17 DIAGNOSIS — Z431 Encounter for attention to gastrostomy: Secondary | ICD-10-CM | POA: Insufficient documentation

## 2019-05-17 DIAGNOSIS — E43 Unspecified severe protein-calorie malnutrition: Secondary | ICD-10-CM | POA: Diagnosis not present

## 2019-05-17 DIAGNOSIS — Z8249 Family history of ischemic heart disease and other diseases of the circulatory system: Secondary | ICD-10-CM | POA: Insufficient documentation

## 2019-05-17 DIAGNOSIS — Z7902 Long term (current) use of antithrombotics/antiplatelets: Secondary | ICD-10-CM | POA: Insufficient documentation

## 2019-05-17 DIAGNOSIS — Z6824 Body mass index (BMI) 24.0-24.9, adult: Secondary | ICD-10-CM | POA: Insufficient documentation

## 2019-05-17 DIAGNOSIS — Z7982 Long term (current) use of aspirin: Secondary | ICD-10-CM | POA: Insufficient documentation

## 2019-05-17 DIAGNOSIS — C3490 Malignant neoplasm of unspecified part of unspecified bronchus or lung: Secondary | ICD-10-CM | POA: Diagnosis not present

## 2019-05-17 DIAGNOSIS — I10 Essential (primary) hypertension: Secondary | ICD-10-CM | POA: Diagnosis not present

## 2019-05-17 DIAGNOSIS — R638 Other symptoms and signs concerning food and fluid intake: Secondary | ICD-10-CM | POA: Diagnosis not present

## 2019-05-17 DIAGNOSIS — Z7951 Long term (current) use of inhaled steroids: Secondary | ICD-10-CM | POA: Diagnosis not present

## 2019-05-17 DIAGNOSIS — Z955 Presence of coronary angioplasty implant and graft: Secondary | ICD-10-CM | POA: Diagnosis not present

## 2019-05-17 DIAGNOSIS — Z91041 Radiographic dye allergy status: Secondary | ICD-10-CM | POA: Diagnosis not present

## 2019-05-17 DIAGNOSIS — R1012 Left upper quadrant pain: Secondary | ICD-10-CM | POA: Diagnosis not present

## 2019-05-17 DIAGNOSIS — I2510 Atherosclerotic heart disease of native coronary artery without angina pectoris: Secondary | ICD-10-CM | POA: Insufficient documentation

## 2019-05-17 DIAGNOSIS — E785 Hyperlipidemia, unspecified: Secondary | ICD-10-CM | POA: Diagnosis not present

## 2019-05-17 DIAGNOSIS — Z79899 Other long term (current) drug therapy: Secondary | ICD-10-CM | POA: Diagnosis not present

## 2019-05-17 DIAGNOSIS — K219 Gastro-esophageal reflux disease without esophagitis: Secondary | ICD-10-CM | POA: Insufficient documentation

## 2019-05-17 DIAGNOSIS — J449 Chronic obstructive pulmonary disease, unspecified: Secondary | ICD-10-CM | POA: Diagnosis not present

## 2019-05-17 DIAGNOSIS — C7951 Secondary malignant neoplasm of bone: Secondary | ICD-10-CM | POA: Diagnosis not present

## 2019-05-17 DIAGNOSIS — G4730 Sleep apnea, unspecified: Secondary | ICD-10-CM | POA: Insufficient documentation

## 2019-05-17 DIAGNOSIS — R1310 Dysphagia, unspecified: Secondary | ICD-10-CM | POA: Insufficient documentation

## 2019-05-17 DIAGNOSIS — R0789 Other chest pain: Secondary | ICD-10-CM | POA: Diagnosis not present

## 2019-05-17 DIAGNOSIS — Z1159 Encounter for screening for other viral diseases: Secondary | ICD-10-CM | POA: Insufficient documentation

## 2019-05-17 DIAGNOSIS — R634 Abnormal weight loss: Secondary | ICD-10-CM | POA: Insufficient documentation

## 2019-05-17 DIAGNOSIS — E46 Unspecified protein-calorie malnutrition: Secondary | ICD-10-CM | POA: Diagnosis not present

## 2019-05-17 DIAGNOSIS — E119 Type 2 diabetes mellitus without complications: Secondary | ICD-10-CM | POA: Insufficient documentation

## 2019-05-17 DIAGNOSIS — C787 Secondary malignant neoplasm of liver and intrahepatic bile duct: Secondary | ICD-10-CM | POA: Diagnosis not present

## 2019-05-17 HISTORY — PX: IR GASTROSTOMY TUBE MOD SED: IMG625

## 2019-05-17 LAB — PROTIME-INR
INR: 1.1 (ref 0.8–1.2)
Prothrombin Time: 14.2 seconds (ref 11.4–15.2)

## 2019-05-17 LAB — SARS CORONAVIRUS 2 BY RT PCR (HOSPITAL ORDER, PERFORMED IN ~~LOC~~ HOSPITAL LAB): SARS Coronavirus 2: NEGATIVE

## 2019-05-17 LAB — APTT: aPTT: 53 seconds — ABNORMAL HIGH (ref 24–36)

## 2019-05-17 MED ORDER — SODIUM CHLORIDE 0.9 % IV SOLN
Freq: Once | INTRAVENOUS | Status: AC
  Administered 2019-05-17: 09:00:00 via INTRAVENOUS
  Filled 2019-05-17: qty 250

## 2019-05-17 MED ORDER — HYDROMORPHONE HCL 1 MG/ML IJ SOLN
1.0000 mg | INTRAMUSCULAR | Status: DC | PRN
  Administered 2019-05-17: 1 mg via INTRAVENOUS
  Filled 2019-05-17: qty 1

## 2019-05-17 MED ORDER — ONDANSETRON HCL 4 MG/2ML IJ SOLN: 4.0000 mg | INTRAMUSCULAR | Status: DC | PRN

## 2019-05-17 MED ORDER — DEXAMETHASONE SODIUM PHOSPHATE 10 MG/ML IJ SOLN
INTRAMUSCULAR | Status: AC
  Filled 2019-05-17: qty 1

## 2019-05-17 MED ORDER — HEPARIN SOD (PORK) LOCK FLUSH 100 UNIT/ML IV SOLN
500.0000 [IU] | Freq: Once | INTRAVENOUS | Status: AC | PRN
  Administered 2019-05-17: 10:00:00 500 [IU]
  Filled 2019-05-17: qty 5

## 2019-05-17 MED ORDER — HEPARIN SOD (PORK) LOCK FLUSH 100 UNIT/ML IV SOLN
500.0000 [IU] | Freq: Once | INTRAVENOUS | Status: AC
  Administered 2019-05-17: 16:00:00 500 [IU] via INTRAVENOUS

## 2019-05-17 MED ORDER — SODIUM CHLORIDE 0.9 % IV SOLN
100.0000 mg/m2 | Freq: Once | INTRAVENOUS | Status: AC
  Administered 2019-05-17: 09:00:00 190 mg via INTRAVENOUS
  Filled 2019-05-17: qty 9.5

## 2019-05-17 MED ORDER — DEXAMETHASONE SODIUM PHOSPHATE 10 MG/ML IJ SOLN
10.0000 mg | Freq: Once | INTRAMUSCULAR | Status: AC
  Administered 2019-05-17: 10 mg via INTRAVENOUS

## 2019-05-17 MED ORDER — FENTANYL CITRATE (PF) 100 MCG/2ML IJ SOLN
INTRAMUSCULAR | Status: AC
  Filled 2019-05-17: qty 2

## 2019-05-17 MED ORDER — MIDAZOLAM HCL 2 MG/2ML IJ SOLN
INTRAMUSCULAR | Status: AC | PRN
  Administered 2019-05-17: 1 mg via INTRAVENOUS
  Administered 2019-05-17 (×2): 0.5 mg via INTRAVENOUS

## 2019-05-17 MED ORDER — LIDOCAINE HCL (PF) 1 % IJ SOLN
INTRAMUSCULAR | Status: AC | PRN
  Administered 2019-05-17: 10 mL

## 2019-05-17 MED ORDER — CEFAZOLIN SODIUM-DEXTROSE 2-4 GM/100ML-% IV SOLN
INTRAVENOUS | Status: AC
  Filled 2019-05-17: qty 100

## 2019-05-17 MED ORDER — SODIUM CHLORIDE 0.9 % IV SOLN
INTRAVENOUS | Status: DC
  Administered 2019-05-17: 12:00:00 20 mL/h via INTRAVENOUS

## 2019-05-17 MED ORDER — MIDAZOLAM HCL 2 MG/2ML IJ SOLN
INTRAMUSCULAR | Status: AC
  Filled 2019-05-17: qty 2

## 2019-05-17 MED ORDER — CEFAZOLIN SODIUM-DEXTROSE 2-4 GM/100ML-% IV SOLN
2.0000 g | INTRAVENOUS | Status: AC
  Administered 2019-05-17: 13:00:00 2 g via INTRAVENOUS

## 2019-05-17 MED ORDER — HYDROCODONE-ACETAMINOPHEN 5-325 MG PO TABS: 1.0000 | ORAL_TABLET | ORAL | Status: DC | PRN

## 2019-05-17 MED ORDER — SODIUM CHLORIDE 0.9% FLUSH
10.0000 mL | INTRAVENOUS | Status: DC | PRN
  Administered 2019-05-17: 10:00:00 10 mL
  Filled 2019-05-17: qty 10

## 2019-05-17 MED ORDER — LIDOCAINE HCL 1 % IJ SOLN
INTRAMUSCULAR | Status: AC
  Filled 2019-05-17: qty 20

## 2019-05-17 MED ORDER — FENTANYL CITRATE (PF) 100 MCG/2ML IJ SOLN
INTRAMUSCULAR | Status: AC | PRN
  Administered 2019-05-17 (×2): 25 ug via INTRAVENOUS
  Administered 2019-05-17: 50 ug via INTRAVENOUS

## 2019-05-17 NOTE — Discharge Instructions (Addendum)
Gastrostomy Tube Home Guide, Adult A gastrostomy tube, or G-tube, is a tube that is inserted through the abdomen into the stomach. The tube is used to give feedings and medicines when a person is unable to eat and drink enough on his or her own. How to care for a G-tube Supplies needed  Saline solution or clean, warm water and soap.  Cotton swab or gauze.  Precut gauze bandage (dressing) and tape, if needed. Instructions 1. Wash your hands with soap and water. 2. If there is a dressing between the person's skin and the tube, remove it. 3. Check the area where the tube enters the skin. Check for problems such as: ? Redness. ? Swelling. ? Pus-like drainage. ? Extra skin growth. 4. Moisten the cotton swab with the saline solution or soap and water mixture. Gently clean around the insertion site. Remove any drainage or crusting. ? When the G-tube is first put in, a normal saline solution or water can be used to clean the skin. ? Mild soap and warm water can be used when the skin around the G-tube site has healed. 5. If there should be a dressing between the person's skin and the tube, apply it at this time. How to flush a G-tube Flush the G-tube regularly to keep it from clogging. Flush it before and after feedings and as often as told by the health care provider. Supplies needed  Purified or sterile water, warmed. If the person has a weak disease-fighting (immune) system, or if he or she has difficulty fighting off infections (is immunocompromised), use only sterile water. ? If you are unsure about the amount of chemical contaminants in purified or drinking water, use sterile water. ? To purify drinking water by boiling:  Boil water for at least 1 minute. Keep lid over water while it boils. Allow water to cool to room temperature before using.  60cc G-tube syringe. Instructions 1. Wash your hands with soap and water. 2. Draw up 30 mL of warm water in a syringe. 3. Connect the syringe  to the tube. 4. Slowly and gently push the water into the tube. G-tube problems and solutions  If the tube comes out: ? Cover the opening with a clean dressing and tape. ? Call a health care provider right away. ? A health care provider will need to put the tube back in within 4 hours.  If there is skin or scar tissue growing where the tube enters the skin: ? Keep the area clean and dry. ? Secure the tube with tape so that the tube does not move around too much. ? Call a health care provider.  If the tube gets clogged: ? Slowly push warm water into the tube with a large syringe. ? Do not force the fluid into the tube or push an object into the tube. ? If you are not able to unclog the tube, call a health care provider right away. Follow these instructions at home: Feedings  Give feedings at room temperature.  Cover and place unused feedings in the refrigerator.  If feedings are continuous: ? Do not put more than 4 hours worth of feedings in the feeding bag. ? Stop the feedings when you need to give medicine or flush the tube. Be sure to restart the feedings. ? Make sure the person's head is above his or her stomach (upright position). This will prevent choking and discomfort.  Replace feeding bags and syringes as told by the health care provider.  Make  sure the person is in the right position during and after feedings: ? During feedings, the person's position should be in the upright position. ? After a noncontinuous feeding (bolus feeding), have the person stay in the upright position for 1 hour. General instructions  Only use syringes made for G-tubes.  Do not pull or put tension on the tube.  Clamp the tube before removing the cap or disconnecting a syringe.  Measure the length of the G-tube every day from the insertion site to the end of the tube.  If the person's G-tube has a balloon, check the fluid in the balloon every week. The amount of fluid that should be in  the balloon can be found in the manufacturers specifications.  Make sure the person takes care of his or her oral health, such as by brushing his or her teeth.  Remove excess air from the G-tube as told by the person's health care provider. This is called "venting."  Keep the area where the tube enters the skin clean and dry.  Do not push feedings, medicines, or flushes rapidly. Contact a health care provider if:  The person with the tube has any of these problems: ? Constipation. ? Fever.  There is a large amount of fluid or mucus-like liquid leaking from the tube.  Skin or scar tissue appears to be growing where the tube enters the skin.  The length of tube from the insertion site to the G-tube gets longer. Get help right away if:  The person with the tube has any of these problems: ? Severe abdominal pain. ? Severe tenderness. ? Severe bloating. ? Nausea. ? Vomiting. ? Trouble breathing. ? Shortness of breath.  Any of these problems happen in the area where the tube enters the skin: ? Redness, irritation, swelling, or soreness. ? Pus-like discharge. ? A bad smell.  The tube is clogged and cannot be flushed.  The tube comes out. Summary  A gastrostomy tube, or G-tube, is a tube that is inserted through the abdomen into the stomach. The tube is used to give feedings and medicines when a person is unable to eat and drink enough on his or her own.  Check and clean the insertion site daily as told by the person's health care provider.  Flush the G-tube regularly to keep it from clogging. Flush it before and after feedings and as often as told by the person's health care provider.  Keep the area where the tube enters the skin clean and dry. This information is not intended to replace advice given to you by your health care provider. Make sure you discuss any questions you have with your health care provider. Document Released: 01/17/2002 Document Revised: 05/24/2017  Document Reviewed: 01/03/2017 Elsevier Interactive Patient Education  2019 [employer].    Moderate Conscious Sedation, Adult, Care After These instructions provide you with information about caring for yourself after your procedure. Your health care provider may also give you more specific instructions. Your treatment has been planned according to current medical practices, but problems sometimes occur. Call your health care provider if you have any problems or questions after your procedure. What can I expect after the procedure? After your procedure, it is common:  To feel sleepy for several hours.  To feel clumsy and have poor balance for several hours.  To have poor judgment for several hours.  To vomit if you eat too soon. Follow these instructions at home: For at least 24 hours after the procedure:  Do not: ? Participate in activities where you could fall or become injured. ? Drive. ? Use heavy machinery. ? Drink alcohol. ? Take sleeping pills or medicines that cause drowsiness. ? Make important decisions or sign legal documents. ? Take care of children on your own.  Rest. Eating and drinking  Follow the diet recommended by your health care provider.  If you vomit: ? Drink water, juice, or soup when you can drink without vomiting. ? Make sure you have little or no nausea before eating solid foods. General instructions  Have a responsible adult stay with you until you are awake and alert.  Take over-the-counter and prescription medicines only as told by your health care provider.  If you smoke, do not smoke without supervision.  Keep all follow-up visits as told by your health care provider. This is important. Contact a health care provider if:  You keep feeling nauseous or you keep vomiting.  You feel light-headed.  You develop a rash.  You have a fever. Get help right away if:  You have trouble breathing. This information is not intended to  replace advice given to you by your health care provider. Make sure you discuss any questions you have with your health care provider. Document Released: 08/29/2013 Document Revised: 04/12/2016 Document Reviewed: 02/28/2016 Elsevier Interactive Patient Education  2019 [employer].

## 2019-05-17 NOTE — H&P (Signed)
Chief Complaint: Patient was seen in consultation today for percutaneous gastric tube placement at the request of West,Andrew L  Referring Physician(s): West,Andrew L Dr Mayme Genta  Supervising Physician: Andrew West  Patient Status: [facility] - Out-pt  History of Present Illness: Andrew West is a 53 y.o. male   Hx Lung Ca-- followed by Dr Andrew West Dysphagia Wt loss Has had G tube before-- placed in IR 11/2018 and removed 02/2019 - was eating well and gaining wt at that time  Now with new dysphagia and wt loss Dr Andrew West has requested replacement of Gastric tube  Pt was scheduled for same procedure 6/22--- but had not held Plavix 5 days Was rescheduled to today LD Plavix was 05/11/19  Covid neg 6/18 Repeat today stat  (needs to be within 72 hrs of aerosolized procedure)    Past Medical History:  Diagnosis Date   Anxiety    CAD (coronary artery disease)    a. s/p multiple PCIs with last cath 11/2016 with severe multivessel CAD, s/p PCTA to LCx but unable to pass stent   Cancer ([place])    Chronic leg pain    bilateral   Chronic lower back pain    COPD (chronic obstructive pulmonary disease) ([place])    Depression    GERD (gastroesophageal reflux disease)    Takes Dexilant   HLD (hyperlipidemia)    Hypertension    Rhabdomyolysis    h/o, r/t statins   Sleep apnea    "can't tolerate mask" (12/16/2016)   Type II diabetes mellitus ([place])     Past Surgical History:  Procedure Laterality Date   BACK SURGERY     BRONCHIAL BIOPSY  08/25/2018   Procedure: BRONCHIAL BIOPSIES;  Surgeon: Andrew Nash, DO;  Location: [facility];  Service: Cardiopulmonary;;   CARDIAC CATHETERIZATION N/A 09/25/2015   Procedure: Left Heart Cath and Coronary Angiography;  Surgeon: Andrew Man, MD;  Location: [place] CV LAB;  Service: Cardiovascular;  Laterality: N/A;   CARDIAC CATHETERIZATION N/A 12/16/2016   Procedure: Left Heart Cath and Coronary  Angiography;  Surgeon: Andrew Man, MD;  Location: [place] CV LAB;  Service: Cardiovascular;  Laterality: N/A;   CARDIAC CATHETERIZATION N/A 12/16/2016   Procedure: Coronary Balloon Angioplasty;  Surgeon: Andrew Man, MD;  Location: [place] CV LAB;  Service: Cardiovascular;  Laterality: N/A;   COLONOSCOPY W/ POLYPECTOMY     CORONARY ANGIOPLASTY  09/25/2015   mid cir & om   CORONARY ANGIOPLASTY WITH STENT PLACEMENT  10/09/2001   PTCA & stenting of mid AV circumflex; 2.5x36m Pixel stent   CORONARY ANGIOPLASTY WITH STENT PLACEMENT  12/13/2001   PCI with stent to mid L circumflex, 95% stenosis to 0% residual   CORONARY ANGIOPLASTY WITH STENT PLACEMENT  10/10/2003   PCI to mid AV circumflex; LAD 30% disease; RCA 100% occluded prox.   CORONARY ANGIOPLASTY WITH STENT PLACEMENT  09/01/2011   PCI with stenting with bare metal stent to mid AV groove circumflex and PDA   CORONARY ANGIOPLASTY WITH STENT PLACEMENT  10/17/2011   cutting balloon angioplasty of ostial lateral OM1 branch and bifurcation AV groove circumflex OM junction; stenosis reduced to 0%   ENDOBRONCHIAL ULTRASOUND Bilateral 08/25/2018   Procedure: ENDOBRONCHIAL ULTRASOUND;  Surgeon: IGarner Nash DO;  [facility];  Service: Cardiopulmonary;  Laterality: Bilateral;   ESOPHAGOGASTRODUODENOSCOPY (EGD) WITH PROPOFOL N/A 12/08/2018   Procedure: ESOPHAGOGASTRODUODENOSCOPY (EGD) WITH PROPOFOL;  Surgeon: Andrew Banister MD;  [facility];  Service: Endoscopy;  Laterality: N/A;   EXCISIONAL HEMORRHOIDECTOMY     FINE NEEDLE ASPIRATION  08/25/2018   Procedure: FINE NEEDLE ASPIRATION;  Surgeon: Andrew Nash, DO;  [facility];  Service: Cardiopulmonary;;   FLEXIBLE BRONCHOSCOPY  08/25/2018   Procedure: FLEXIBLE BRONCHOSCOPY;  Surgeon: Andrew Nash, DO;  [facility];  Service: Cardiopulmonary;;   IR GASTROSTOMY TUBE MOD SED  12/11/2018   IR GASTROSTOMY TUBE REMOVAL   03/22/2019   IR IMAGING GUIDED PORT INSERTION  09/29/2018   LEFT HEART CATHETERIZATION WITH CORONARY ANGIOGRAM N/A 10/18/2011   Procedure: LEFT HEART CATHETERIZATION WITH CORONARY ANGIOGRAM;  Surgeon: Andrew Man, MD;  Location: [facility] CATH LAB;  Service: Cardiovascular;  Laterality: N/A;   LUMBAR LAMINECTOMY/DECOMPRESSION MICRODISCECTOMY  03/31/2012   Procedure: LUMBAR LAMINECTOMY/DECOMPRESSION MICRODISCECTOMY 1 LEVEL;  Surgeon: Andrew Pitter, MD;  Location: [place] NEURO ORS;  Service: Neurosurgery;  Laterality: Left;   TRANSTHORACIC ECHOCARDIOGRAM  07/28/2011   EF 55-65%; LVH, grade 1 diastolic dysfunction;     Allergies: Iohexol  Medications: Prior to Admission medications   Medication Sig Start Date End Date Taking? Authorizing Provider  acetaminophen (TYLENOL) 500 MG tablet Take 1,000 mg by mouth every 6 (six) hours as needed for moderate pain.   Yes [provider]  acetaminophen-codeine (TYLENOL #3) 300-30 MG tablet Take 1 tablet by mouth every 12 (twelve) hours as needed for moderate pain. Dx; lumbar radiculopathy, lung ca 04/26/19  Yes Newlin, Enobong, MD  albuterol (VENTOLIN HFA) 108 (90 Base) MCG/ACT inhaler Inhale 1 puff into the lungs every 6 (six) hours as needed for wheezing or shortness of breath. 03/20/19  Yes [name], MD  aspirin 81 MG tablet Take 1 tablet (81 mg total) daily by mouth. 10/10/17  Yes Newlin, Enobong, MD  cetirizine (ZYRTEC) 10 MG tablet Take 1 tablet (10 mg total) by mouth daily. 04/26/19  Yes [name], MD  Cholecalciferol (VITAMIN D) 2000 units tablet Take 1 tablet (2,000 Units total) by mouth daily. 08/04/18  Yes Jamse Arn, MD  clopidogrel (PLAVIX) 75 MG tablet Take 1 tablet (75 mg total) by mouth daily. YOU MAY RESTART 08/26/2018 03/19/19  Yes Hilty, Nadean Corwin, MD  diclofenac sodium (VOLTAREN) 1 % GEL Apply 2 g topically 4 (four) times daily. Patient taking differently: Apply 2 g topically 4 (four) times daily as needed (pain).  04/20/18   Yes [name], MD  dronabinol (MARINOL) 5 MG capsule Take 1 capsule (5 mg total) by mouth 2 (two) times daily before a meal. 05/08/19  Yes Curt Bears, MD  DULoxetine (CYMBALTA) 60 MG capsule Take 1 capsule (60 mg total) by mouth daily. 03/20/19  Yes Newlin, Charlane Ferretti, MD  Fluticasone-Salmeterol (ADVAIR) 100-50 MCG/DOSE AEPB Inhale 1 puff into the lungs 2 (two) times daily. 03/20/19  Yes Newlin, Charlane Ferretti, MD  LORazepam (ATIVAN) 0.5 MG tablet 1 tab po q 4-6 hours prn or 1 tab po 30 minutes prior to radiation Patient taking differently: Take 0.5 mg by mouth every 6 (six) hours as needed for anxiety or sedation. 1 tab po q 4-6 hours prn or 1 tab po 30 minutes prior to radiation 08/30/18  Yes Hayden Pedro, PA-C  mirtazapine (REMERON) 30 MG tablet Take 1 tablet (30 mg total) by mouth at bedtime. 04/26/19  Yes Newlin, Charlane Ferretti, MD  nebivolol (BYSTOLIC) 5 MG tablet Take 1 tablet (5 mg total) by mouth daily. 03/19/19  Yes Hilty, Nadean Corwin, MD  nitroGLYCERIN (NITROSTAT) 0.4 MG SL tablet Place 1 tablet (0.4 mg total) under  the tongue every 5 (five) minutes as needed for chest pain. 03/19/19  Yes Hilty, Nadean Corwin, MD  ondansetron (ZOFRAN ODT) 4 MG disintegrating tablet Take 1 tablet (4 mg total) by mouth every 8 (eight) hours as needed for nausea or vomiting. 02/19/19  Yes Tegeler, Gwenyth Allegra, MD  oxyCODONE-acetaminophen (PERCOCET/ROXICET) 5-325 MG tablet Take 1 tablet by mouth every 6 (six) hours as needed for severe pain. 05/07/19  Yes West, Andrew L, PA-C  pantoprazole (PROTONIX) 40 MG tablet Take 1 tablet (40 mg total) by mouth daily. 03/20/19  Yes [name], MD  prochlorperazine (COMPAZINE) 10 MG tablet Take 1 tablet (10 mg total) by mouth every 6 (six) hours as needed for nausea or vomiting. 05/07/19  Yes West, Andrew L, PA-C  promethazine (PHENERGAN) 25 MG suppository Place 1 suppository (25 mg total) rectally every 6 (six) hours as needed for nausea or vomiting.  04/02/19  Yes McDonald, Mia A, PA-C  ranolazine (RANEXA) 1000 MG SR tablet Take 1 tablet (1,000 mg total) by mouth 2 (two) times daily. 05/24/17  Yes Hilty, Nadean Corwin, MD  rosuvastatin (CRESTOR) 20 MG tablet Take 1 tablet (20 mg total) by mouth daily. 03/20/19  Yes [name], MD  sucralfate (CARAFATE) 1 g tablet Take 1 tablet by mouth 4 (four) times daily -  before meals and at bedtime. 09/23/18  Yes [provider]  tiotropium (SPIRIVA HANDIHALER) 18 MCG inhalation capsule Place 1 capsule (18 mcg total) into inhaler and inhale daily. 03/20/19  Yes [name], MD  vitamin B-12 (CYANOCOBALAMIN) 500 MCG tablet Take 1 tablet (500 mcg total) by mouth daily. 06/30/18  Yes Jamse Arn, MD  zolpidem (AMBIEN) 5 MG tablet Take 1 tablet (5 mg total) by mouth at bedtime. 03/20/19  Yes [name], MD  ACCU-CHEK FASTCLIX LANCETS MISC Use as directed 01/02/19   [name], MD  glucose blood (ACCU-CHEK GUIDE) test strip Use as instructed 01/02/19   [name], MD  HYDROcodone-acetaminophen (NORCO/VICODIN) 5-325 MG tablet Take 1 tablet by mouth every 6 (six) hours as needed. Patient not taking: Reported on 05/15/2019 04/02/19   McDonald, Maree Erie A, PA-C  levofloxacin (LEVAQUIN) 500 MG tablet Take 1 tablet (500 mg total) by mouth daily. Patient not taking: Reported on 05/15/2019 05/03/19   [name], MD     Family History  Problem Relation Age of Onset   Heart attack Father    Hypertension Mother    Diabetes Mother    Heart disease Brother        x 3    Heart attack Brother        deceased   Hypertension Sister    Diabetes Sister    Anesthesia problems Neg Hx    Hypotension Neg Hx    Malignant hyperthermia Neg Hx    Pseudochol deficiency Neg Hx     Social History   Socioeconomic History   Marital status: Married    Spouse name: Not on file   Number of children: Not on file   Years of education: Not on file   Highest education level: Not on file    Occupational History   Not on file  Social Needs   Financial resource strain: Not on file   Food insecurity    Worry: Not on file    Inability: Not on file   Transportation needs    Medical: No    Non-medical: No  Tobacco Use   Smoking status: Former Smoker    Packs/day: 0.25  Years: 25.00    Pack years: 6.25    Types: Cigarettes    Quit date: 05/24/2015    Years since quitting: 3.9   Smokeless tobacco: Never Used  Substance and Sexual Activity   Alcohol use: No    Alcohol/week: 0.0 standard drinks   Drug use: No   Sexual activity: Yes  Lifestyle   Physical activity    Days per week: Not on file    Minutes per session: Not on file   Stress: Not on file  Relationships   Social connections    Talks on phone: Not on file    Gets together: Not on file    Attends religious service: Not on file    Active member of club or organization: Not on file    Attends meetings of clubs or organizations: Not on file    Relationship status: Not on file  Other Topics Concern   Not on file  Social History Narrative   Not on file    Review of Systems: A 12 point ROS discussed and pertinent positives are indicated in the HPI above.  All other systems are negative.  Review of Systems  Constitutional: Positive for activity change, appetite change, fatigue and unexpected weight change. Negative for fever.  Respiratory: Negative for cough and shortness of breath.   Cardiovascular: Negative for chest pain.  Gastrointestinal: Positive for abdominal pain.  Musculoskeletal: Positive for back pain.  Neurological: Positive for weakness.  Psychiatric/Behavioral: Negative for behavioral problems and confusion.    Vital Signs: BP (!) 139/96    Pulse 60    Temp (!) 97.3 F (36.3 C) (Tympanic)    Ht _0  (1.753 m)    Wt 163 lb (73.9 kg)    SpO2 100%    BMI 24.07 kg/m   Physical Exam Vitals signs reviewed.  Cardiovascular:     Rate and Rhythm: Normal rate and regular rhythm.      Heart sounds: Normal heart sounds.  Pulmonary:     Breath sounds: Normal breath sounds.  Abdominal:     Palpations: Abdomen is soft.  Musculoskeletal: Normal range of motion.  Skin:    General: Skin is warm and dry.  Neurological:     General: No focal deficit present.     Mental Status: He is alert and oriented to person, place, and time.  Psychiatric:        Mood and Affect: Mood normal.        Behavior: Behavior normal.        Thought Content: Thought content normal.        Judgment: Judgment normal.     Imaging: Nm Pet Image Initial (pi) Skull Base To Thigh  Result Date: 05/03/2019 CLINICAL DATA:  Subsequent treatment strategy for staging of lung cancer. EXAM: NUCLEAR MEDICINE PET SKULL BASE TO THIGH TECHNIQUE: 8.7 mCi F-18 FDG was injected intravenously. Full-ring PET imaging was performed from the skull base to thigh after the radiotracer. CT data was obtained and used for attenuation correction and anatomic localization. Fasting blood glucose: 70 mg/dl COMPARISON:  PET of 08/21/2018.  Diagnostic CTs of 03/04/2019. FINDINGS: Mediastinal blood pool activity: SUV max 2.4 Liver activity: SUV max NA NECK: No areas of abnormal hypermetabolism. Incidental CT findings: Right maxillary sinus mucous retention cyst or polyp. Bilateral carotid atherosclerosis. CHEST: The previously the described middle mediastinal and right hilar nodal hypermetabolism has resolved. A node at the level of the thoracic inlet measures 8 mm and a S.U.V. max of  7.4 on image 46/4. Vague, posterior right upper lobe 1.3 cm pulmonary opacity with hypermetabolism measures a S.U.V. max of 3.9 on image 22/8 and is new since 03/04/2019. Multifocal low-level pleural hypermetabolism with concurrent pleural thickening. Example at a S.U.V. max of 2.5 on image 89/4. Incidental CT findings: Resolution of right pleural fluid. Multivessel coronary artery atherosclerosis. Aortic atherosclerosis. Left Port-A-Cath tip low SVC.  Centrilobular and paraseptal emphysema. ABDOMEN/PELVIS: Hypermetabolic high left hepatic lobe 11 mm lesion on image 102/4 measures a S.U.V. max of 10.0. A vague area of more mild right hepatic lobe hypermetabolism is without well-defined CT correlate and measures a S.U.V. max of 4.5, including on approximately image 108/4. No abdominopelvic nodal hypermetabolism. Likely physiologic anal hypermetabolism without CT correlate. This measures a S.U.V. max of 6.9. Incidental CT findings: Abdominal aortic and branch vessel atherosclerosis. Large colonic stool burden. SKELETON: Development of multifocal osseous metastasis. Examples within the right side of the C2 vertebral body and anterior right femoral neck. Index lesion within the L4 vertebral body measures a S.U.V. max of 16.3. Incidental CT findings: none IMPRESSION: 1. Since the PET of 08/21/2018, mixed response to therapy. 2. The right paratracheal and right hilar nodal hypermetabolism has resolved. However, there has been interval development of hepatic and osseous metastasis, as well as a new hypermetabolic node at the level of the thoracic inlet. 3. Foci of right-sided pleural based hypermetabolism are indeterminate but suspicious for pleural metastasis. 4. Aortic atherosclerosis (ICD10-I70.0), coronary artery atherosclerosis and emphysema (ICD10-J43.9). 5. Right upper lobe hypermetabolic pulmonary opacity, favored to represent an area of infection or inflammation. Electronically Signed   By: Abigail Miyamoto M.D.   On: 05/03/2019 09:24    Labs:  CBC: Recent Labs    05/07/19 1445 05/07/19 2314 05/14/19 0919 05/16/19 0815  WBC 4.1 3.7* 4.4 5.5  HGB 9.7* 8.9* 9.5* 9.6*  HCT 30.6* 27.6* 29.2* 30.1*  PLT 159 139* 192 148*    COAGS: Recent Labs    08/17/18 1201 09/29/18 1339 12/05/18 1242 12/11/18 0443  INR 1.1* 1.04 1.08 1.04  APTT 33.6* 28  --   --     BMP: Recent Labs    05/07/19 1445 05/07/19 2314 05/14/19 0919 05/16/19 0815  NA 142  142 144 145  K 3.9 4.2 4.4 3.8  CL 105 108 108 108  CO2 _0 GLUCOSE 78 79 77 77  BUN _1 CALCIUM 9.0 8.9 8.6* 8.8*  CREATININE 1.98* 1.85* 2.11* 1.94*  GFRNONAA 38* 41* 35* 39*  GFRAA 44* 47* 40* 45*    LIVER FUNCTION TESTS: Recent Labs    04/01/19 2252 05/07/19 1445 05/07/19 2314 05/16/19 0815  BILITOT 0.5 0.2* 0.5 0.4  AST 11* 11* 11* 18  ALT _2 ALKPHOS 61 66 57 62  PROT 7.2 7.0 6.6 6.9  ALBUMIN 3.4* 3.3* 3.2* 3.3*    TUMOR MARKERS: No results for input(s): AFPTM, CEA, CA199, CHROMGRNA in the last 8760 hours.  Assessment and Plan:  Dysphagia Wt loss Hx Lung Ca Previous G tube from 11/2018 - 02/2019 Did well with that tube-- removed because doing so well at the time Scheduled for new percutaneous gastric tube placement Uses Hydrocodone at home-- Rx with Dr Andrew West Risks and benefits discussed with the patient including, but not limited to the need for a barium enema during the procedure, bleeding, infection, peritonitis, or damage to adjacent structures.  All of the patient's questions were answered, patient is agreeable to  proceed. Consent signed and in chart.   Thank you for this interesting consult.  I greatly enjoyed meeting MONTEY EBEL and look forward to participating in their care.  A copy of this report was sent to the requesting provider on this date.  Electronically Signed: Lavonia Drafts, PA-C 05/17/2019, 11:22 AM   I spent a total of    25 Minutes in face to face in clinical consultation, greater than 50% of which was counseling/coordinating care for percutaneous gastric tube placement

## 2019-05-17 NOTE — Progress Notes (Addendum)
Left upper chest PAC accessed from cancer center; lab drawn from [facility] after wasted blood drawn and flushed easily with 0.9% NSS 67ml and 0.9% NSS started at 20cc/hr via [facility]

## 2019-05-17 NOTE — Procedures (Addendum)
Interventional Radiology Procedure:   Indications: Metastatic lung cancer and dysphagia.  Procedure: Gastrostomy tube placement  Findings: 24 Fr tube in stomach  Complications: None     EBL: less than 20 ml  Plan: Bedrest 2 hours and then discharge to home.  Clear liquids tonight.   Resume diet tomorrow.     Deklan Minar R. Anselm Pancoast, MD  Pager: [phone]

## 2019-05-17 NOTE — Telephone Encounter (Signed)
-----   Message from Ishmael Holter, RN sent at 05/16/2019  5:50 PM EDT ----- Regarding: Dr. Julien Nordmann 1st tx f/u call Dr. Julien Nordmann 1st tx f/u call

## 2019-05-17 NOTE — Patient Instructions (Signed)
[place] Discharge Instructions for Patients Receiving Chemotherapy  Today you received the following chemotherapy agents Etoposide  To help prevent nausea and vomiting after your treatment, we encourage you to take your nausea medication as directed.   If you develop nausea and vomiting that is not controlled by your nausea medication, call the clinic.   BELOW ARE SYMPTOMS THAT SHOULD BE REPORTED IMMEDIATELY:  *FEVER GREATER THAN 100.5 F  *CHILLS WITH OR WITHOUT FEVER  NAUSEA AND VOMITING THAT IS NOT CONTROLLED WITH YOUR NAUSEA MEDICATION  *UNUSUAL SHORTNESS OF BREATH  *UNUSUAL BRUISING OR BLEEDING  TENDERNESS IN MOUTH AND THROAT WITH OR WITHOUT PRESENCE OF ULCERS  *URINARY PROBLEMS  *BOWEL PROBLEMS  UNUSUAL RASH Items with * indicate a potential emergency and should be followed up as soon as possible.  Feel free to call the clinic should you have any questions or concerns. The clinic phone number is [phone].  Please show the [place] at check-in to the Emergency Department and triage nurse.

## 2019-05-17 NOTE — Telephone Encounter (Signed)
Spoke with patient in the infusion area-he is on day 2 of 3 day chemo regime.  He states he is tolerating it well so far, though he has noticed an increase in his back pain, leg pain. He is scheduled for PEG tube placement after today's chemo.  He has had a PEG tube before so he states he knows what to expect.  He states he will need refill on his Percocet soon. Last filled on 05/07/19 for # 60. He knows it is early for refill and has some left - did not know how many.  He wanted to get the request in early

## 2019-05-17 NOTE — Progress Notes (Signed)
Dr Anselm Pancoast notified of client c/o abd pain 9/10 and pain medication given and dressing with dime size area pink tinged at site; no new orders noted

## 2019-05-17 NOTE — Telephone Encounter (Signed)
He is seen by the [facility] pain clinic. Does he receive pain medication from them?

## 2019-05-18 ENCOUNTER — Ambulatory Visit
Admission: RE | Admit: 2019-05-18 | Discharge: 2019-05-18 | Disposition: A | Discharge status: Home / Self Care | Payer: Medicare Other | Source: Ambulatory Visit | Attending: Radiation Oncology | Admitting: Radiation Oncology

## 2019-05-18 ENCOUNTER — Other Ambulatory Visit: Payer: Self-pay

## 2019-05-18 ENCOUNTER — Inpatient Hospital Stay: Payer: Medicare Other

## 2019-05-18 ENCOUNTER — Encounter: Payer: Self-pay | Admitting: General Practice

## 2019-05-18 ENCOUNTER — Other Ambulatory Visit: Payer: Self-pay | Admitting: Physician Assistant

## 2019-05-18 ENCOUNTER — Telehealth: Payer: Self-pay

## 2019-05-18 VITALS — BP 109/72 | HR 62 | Temp 98.2°F | Resp 16

## 2019-05-18 DIAGNOSIS — C7951 Secondary malignant neoplasm of bone: Secondary | ICD-10-CM

## 2019-05-18 DIAGNOSIS — C3411 Malignant neoplasm of upper lobe, right bronchus or lung: Secondary | ICD-10-CM

## 2019-05-18 DIAGNOSIS — C787 Secondary malignant neoplasm of liver and intrahepatic bile duct: Secondary | ICD-10-CM | POA: Diagnosis not present

## 2019-05-18 DIAGNOSIS — R0789 Other chest pain: Secondary | ICD-10-CM | POA: Diagnosis not present

## 2019-05-18 DIAGNOSIS — Z20828 Contact with and (suspected) exposure to other viral communicable diseases: Secondary | ICD-10-CM | POA: Diagnosis not present

## 2019-05-18 DIAGNOSIS — Z510 Encounter for antineoplastic radiation therapy: Secondary | ICD-10-CM | POA: Diagnosis not present

## 2019-05-18 DIAGNOSIS — J948 Other specified pleural conditions: Secondary | ICD-10-CM | POA: Diagnosis not present

## 2019-05-18 DIAGNOSIS — R1012 Left upper quadrant pain: Secondary | ICD-10-CM | POA: Diagnosis not present

## 2019-05-18 MED ORDER — OXYCODONE-ACETAMINOPHEN 5-325 MG PO TABS
1.0000 | ORAL_TABLET | Freq: Once | ORAL | Status: AC
  Administered 2019-05-18: 1 via ORAL

## 2019-05-18 MED ORDER — DEXAMETHASONE SODIUM PHOSPHATE 10 MG/ML IJ SOLN
10.0000 mg | Freq: Once | INTRAMUSCULAR | Status: AC
  Administered 2019-05-18: 10 mg via INTRAVENOUS

## 2019-05-18 MED ORDER — SODIUM CHLORIDE 0.9 % IV SOLN
Freq: Once | INTRAVENOUS | Status: AC
  Administered 2019-05-18: 11:00:00 via INTRAVENOUS
  Filled 2019-05-18: qty 250

## 2019-05-18 MED ORDER — HEPARIN SOD (PORK) LOCK FLUSH 100 UNIT/ML IV SOLN
500.0000 [IU] | Freq: Once | INTRAVENOUS | Status: AC | PRN
  Administered 2019-05-18: 500 [IU]
  Filled 2019-05-18: qty 5

## 2019-05-18 MED ORDER — DEXAMETHASONE SODIUM PHOSPHATE 10 MG/ML IJ SOLN
INTRAMUSCULAR | Status: AC
  Filled 2019-05-18: qty 1

## 2019-05-18 MED ORDER — SODIUM CHLORIDE 0.9% FLUSH
10.0000 mL | INTRAVENOUS | Status: DC | PRN
  Administered 2019-05-18: 10 mL
  Filled 2019-05-18: qty 10

## 2019-05-18 MED ORDER — OXYCODONE-ACETAMINOPHEN 5-325 MG PO TABS: 1.0000 | ORAL_TABLET | Freq: Once | ORAL | Status: DC

## 2019-05-18 MED ORDER — OXYCODONE-ACETAMINOPHEN 5-325 MG PO TABS
ORAL_TABLET | ORAL | Status: AC
  Filled 2019-05-18: qty 1

## 2019-05-18 MED ORDER — SODIUM CHLORIDE 0.9 % IV SOLN
100.0000 mg/m2 | Freq: Once | INTRAVENOUS | Status: AC
  Administered 2019-05-18: 190 mg via INTRAVENOUS
  Filled 2019-05-18: qty 9.5

## 2019-05-18 NOTE — Progress Notes (Signed)
[place] CSW Progress Notes  Request received from infusion room RN "patient would like to see CSW."  Is assigned to [place] who is not on site today.  Undersigned enrolled in [place] for gas/food assistance, also referred to [employer] gas card program.  [facility] will assist if funds remain for disbursement at this time.  No other needs reported by patient at this time.  Edwyna Shell, LCSW Clinical Social Worker Phone:  [phone]

## 2019-05-18 NOTE — Telephone Encounter (Signed)
Nutrition  Noted PEG placed on 6/25.    Called patient for nutrition follow-up.  Patient reports that PEG tube site is painful this am.  Has not started using tube for nutrition due to pain.  Patient reports that he has tube feeding formula and supplies on hand.   Patient planning to address pain this am at appointment.    Intervention: Recommend starting with 1/2 carton of Two Cal 4 times per day and water flush of 68ml before and after.  Once tolerating recommend increasing to 1 full carton 4 times per day.  Will add promod (liquid) protein next week 39ml TID.  RD mailed tube feeding regimen to patient last week.  Patient verbalized understanding of tube feeding regimen.  Next visit: Tuesday, June 30  Andrew West, Andrew West, Andrew West Registered Dietitian [phone] (pager)

## 2019-05-18 NOTE — Patient Instructions (Addendum)
[facility] Discharge Instructions for Patients Receiving Chemotherapy  Today you received the following chemotherapy agents Etoposide.  To help prevent nausea and vomiting after your treatment, we encourage you to take your nausea medication as directed.   If you develop nausea and vomiting that is not controlled by your nausea medication, call the clinic.   BELOW ARE SYMPTOMS THAT SHOULD BE REPORTED IMMEDIATELY:  *FEVER GREATER THAN 100.5 F  *CHILLS WITH OR WITHOUT FEVER  NAUSEA AND VOMITING THAT IS NOT CONTROLLED WITH YOUR NAUSEA MEDICATION  *UNUSUAL SHORTNESS OF BREATH  *UNUSUAL BRUISING OR BLEEDING  TENDERNESS IN MOUTH AND THROAT WITH OR WITHOUT PRESENCE OF ULCERS  *URINARY PROBLEMS  *BOWEL PROBLEMS  UNUSUAL RASH Items with * indicate a potential emergency and should be followed up as soon as possible.  Feel free to call the clinic should you have any questions or concerns. The clinic phone number is [phone].  Please show the Tok at check-in to the Emergency Department and triage nurse.  Pegfilgrastim injection What is this medicine? PEGFILGRASTIM (PEG fil gra stim) is a long-acting granulocyte colony-stimulating factor that stimulates the growth of neutrophils, a type of white blood cell important in the body's fight against infection. It is used to reduce the incidence of fever and infection in patients with certain types of cancer who are receiving chemotherapy that affects the bone marrow, and to increase survival after being exposed to high doses of radiation. This medicine may be used for other purposes; ask your health care provider or pharmacist if you have questions. COMMON BRAND NAME(S): Domenic Moras, UDENYCA What should I tell my health care provider before I take this medicine? They need to know if you have any of these conditions: -kidney disease -latex allergy -ongoing radiation therapy -sickle cell disease -skin  reactions to acrylic adhesives (On-Body Injector only) -an unusual or allergic reaction to pegfilgrastim, filgrastim, other medicines, foods, dyes, or preservatives -pregnant or trying to get pregnant -breast-feeding How should I use this medicine? This medicine is for injection under the skin. If you get this medicine at home, you will be taught how to prepare and give the pre-filled syringe or how to use the On-body Injector. Refer to the patient Instructions for Use for detailed instructions. Use exactly as directed. Tell your healthcare provider immediately if you suspect that the On-body Injector may not have performed as intended or if you suspect the use of the On-body Injector resulted in a missed or partial dose. It is important that you put your used needles and syringes in a special sharps container. Do not put them in a trash can. If you do not have a sharps container, call your pharmacist or healthcare provider to get one. Talk to your pediatrician regarding the use of this medicine in children. While this drug may be prescribed for selected conditions, precautions do apply. Overdosage: If you think you have taken too much of this medicine contact a poison control center or emergency room at once. NOTE: This medicine is only for you. Do not share this medicine with others. What if I miss a dose? It is important not to miss your dose. Call your doctor or health care professional if you miss your dose. If you miss a dose due to an On-body Injector failure or leakage, a new dose should be administered as soon as possible using a single prefilled syringe for manual use. What may interact with this medicine? Interactions have not been studied. Give  your health care provider a list of all the medicines, herbs, non-prescription drugs, or dietary supplements you use. Also tell them if you smoke, drink alcohol, or use illegal drugs. Some items may interact with your medicine. This list may not  describe all possible interactions. Give your health care provider a list of all the medicines, herbs, non-prescription drugs, or dietary supplements you use. Also tell them if you smoke, drink alcohol, or use illegal drugs. Some items may interact with your medicine. What should I watch for while using this medicine? You may need blood work done while you are taking this medicine. If you are going to need a MRI, CT scan, or other procedure, tell your doctor that you are using this medicine (On-Body Injector only). What side effects may I notice from receiving this medicine? Side effects that you should report to your doctor or health care professional as soon as possible: -allergic reactions like skin rash, itching or hives, swelling of the face, lips, or tongue -back pain -dizziness -fever -pain, redness, or irritation at site where injected -pinpoint red spots on the skin -red or dark-brown urine -shortness of breath or breathing problems -stomach or side pain, or pain at the shoulder -swelling -tiredness -trouble passing urine or change in the amount of urine Side effects that usually do not require medical attention (report to your doctor or health care professional if they continue or are bothersome): -bone pain -muscle pain This list may not describe all possible side effects. Call your doctor for medical advice about side effects. You may report side effects to FDA at 1-800-FDA-1088. Where should I keep my medicine? Keep out of the reach of children. If you are using this medicine at home, you will be instructed on how to store it. Throw away any unused medicine after the expiration date on the label. NOTE: This sheet is a summary. It may not cover all possible information. If you have questions about this medicine, talk to your doctor, pharmacist, or health care provider.  2019 [employer]vier/Gold Standard (2018-02-13 16:57:08)

## 2019-05-19 ENCOUNTER — Inpatient Hospital Stay: Payer: Medicare Other

## 2019-05-19 DIAGNOSIS — C3411 Malignant neoplasm of upper lobe, right bronchus or lung: Secondary | ICD-10-CM | POA: Diagnosis not present

## 2019-05-19 DIAGNOSIS — J948 Other specified pleural conditions: Secondary | ICD-10-CM | POA: Diagnosis not present

## 2019-05-19 DIAGNOSIS — C7951 Secondary malignant neoplasm of bone: Secondary | ICD-10-CM | POA: Diagnosis not present

## 2019-05-19 DIAGNOSIS — C787 Secondary malignant neoplasm of liver and intrahepatic bile duct: Secondary | ICD-10-CM | POA: Diagnosis not present

## 2019-05-19 DIAGNOSIS — R1012 Left upper quadrant pain: Secondary | ICD-10-CM | POA: Diagnosis not present

## 2019-05-19 DIAGNOSIS — R0789 Other chest pain: Secondary | ICD-10-CM | POA: Diagnosis not present

## 2019-05-19 MED ORDER — PEGFILGRASTIM-CBQV 6 MG/0.6ML ~~LOC~~ SOSY
PREFILLED_SYRINGE | SUBCUTANEOUS | Status: AC
  Filled 2019-05-19: qty 0.6

## 2019-05-19 MED ORDER — PEGFILGRASTIM-CBQV 6 MG/0.6ML ~~LOC~~ SOSY
6.0000 mg | PREFILLED_SYRINGE | Freq: Once | SUBCUTANEOUS | Status: AC
  Administered 2019-05-19: 6 mg via SUBCUTANEOUS

## 2019-05-19 NOTE — Patient Instructions (Signed)

## 2019-05-21 ENCOUNTER — Ambulatory Visit (HOSPITAL_COMMUNITY)
Admission: RE | Admit: 2019-05-21 | Discharge: 2019-05-21 | Disposition: A | Discharge status: Home / Self Care | Payer: Medicare Other | Source: Ambulatory Visit | Attending: Physician Assistant | Admitting: Physician Assistant

## 2019-05-21 ENCOUNTER — Ambulatory Visit
Admission: RE | Admit: 2019-05-21 | Discharge: 2019-05-21 | Disposition: A | Discharge status: Home / Self Care | Payer: Medicare Other | Source: Ambulatory Visit | Attending: Radiation Oncology | Admitting: Radiation Oncology

## 2019-05-21 ENCOUNTER — Telehealth: Payer: Self-pay | Admitting: *Deleted

## 2019-05-21 ENCOUNTER — Other Ambulatory Visit: Payer: Self-pay | Admitting: Physician Assistant

## 2019-05-21 ENCOUNTER — Other Ambulatory Visit: Payer: Self-pay

## 2019-05-21 DIAGNOSIS — E43 Unspecified severe protein-calorie malnutrition: Secondary | ICD-10-CM | POA: Diagnosis not present

## 2019-05-21 DIAGNOSIS — A419 Sepsis, unspecified organism: Secondary | ICD-10-CM | POA: Diagnosis not present

## 2019-05-21 DIAGNOSIS — I130 Hypertensive heart and chronic kidney disease with heart failure and stage 1 through stage 4 chronic kidney disease, or unspecified chronic kidney disease: Secondary | ICD-10-CM | POA: Diagnosis not present

## 2019-05-21 DIAGNOSIS — Z20828 Contact with and (suspected) exposure to other viral communicable diseases: Secondary | ICD-10-CM | POA: Diagnosis not present

## 2019-05-21 DIAGNOSIS — C3490 Malignant neoplasm of unspecified part of unspecified bronchus or lung: Secondary | ICD-10-CM | POA: Diagnosis not present

## 2019-05-21 DIAGNOSIS — R509 Fever, unspecified: Secondary | ICD-10-CM | POA: Diagnosis not present

## 2019-05-21 DIAGNOSIS — C3411 Malignant neoplasm of upper lobe, right bronchus or lung: Secondary | ICD-10-CM

## 2019-05-21 DIAGNOSIS — I5032 Chronic diastolic (congestive) heart failure: Secondary | ICD-10-CM | POA: Diagnosis not present

## 2019-05-21 DIAGNOSIS — D709 Neutropenia, unspecified: Secondary | ICD-10-CM | POA: Diagnosis not present

## 2019-05-21 DIAGNOSIS — C7931 Secondary malignant neoplasm of brain: Secondary | ICD-10-CM | POA: Diagnosis not present

## 2019-05-21 DIAGNOSIS — R531 Weakness: Secondary | ICD-10-CM | POA: Diagnosis not present

## 2019-05-21 DIAGNOSIS — C7951 Secondary malignant neoplasm of bone: Secondary | ICD-10-CM | POA: Diagnosis not present

## 2019-05-21 MED ORDER — GADOBUTROL 1 MMOL/ML IV SOLN
7.0000 mL | Freq: Once | INTRAVENOUS | Status: AC | PRN
  Administered 2019-05-21: 7 mL via INTRAVENOUS

## 2019-05-21 NOTE — Telephone Encounter (Signed)
Pt called c/o fatigue, stomach pain at new surgical site from tube placement. [facility] Rn coming today to look at this. Discussed with pt injection on Saturday in addition to Chemo can cause fatigue and aching. Recommend pt take tylenol and Claritin for the aches, encouraged hydration, and protein. Pt advised he is only getting 2 feedings out of 4 in a day, able to eat PO. Request for Pain medication refill and if MD feels it's ok for him to go to a fishing tournament this weekend. Last pain medication refill  Reviewed w/MD pt should take correct precautions at fishing tournament, call to [facility] pt was seen 6/8 by MD, f/u telehealth visit 6/11-cancelled no info on why cancelled), next f/u 7/8 at 8am.  Reviewed with MD, Pain medication will be refilled until he sees pain management on 7/8, instructed pt to take all necessary precautions with Fishing. Reviewed pt appts, pt verbalized understanding. No further concerns.

## 2019-05-22 ENCOUNTER — Encounter: Payer: Self-pay | Admitting: Internal Medicine

## 2019-05-22 ENCOUNTER — Telehealth: Payer: Self-pay | Admitting: *Deleted

## 2019-05-22 ENCOUNTER — Inpatient Hospital Stay: Payer: Medicare Other

## 2019-05-22 ENCOUNTER — Ambulatory Visit
Admission: RE | Admit: 2019-05-22 | Discharge: 2019-05-22 | Disposition: A | Discharge status: Home / Self Care | Payer: Medicare Other | Source: Ambulatory Visit | Attending: Radiation Oncology | Admitting: Radiation Oncology

## 2019-05-22 ENCOUNTER — Inpatient Hospital Stay (HOSPITAL_BASED_OUTPATIENT_CLINIC_OR_DEPARTMENT_OTHER): Payer: Medicare Other | Admitting: Internal Medicine

## 2019-05-22 ENCOUNTER — Other Ambulatory Visit: Payer: Self-pay

## 2019-05-22 VITALS — BP 109/67 | HR 87 | Temp 98.7°F | Resp 18 | Ht 69.0 in | Wt 161.6 lb

## 2019-05-22 DIAGNOSIS — R0789 Other chest pain: Secondary | ICD-10-CM | POA: Diagnosis not present

## 2019-05-22 DIAGNOSIS — C3411 Malignant neoplasm of upper lobe, right bronchus or lung: Secondary | ICD-10-CM | POA: Diagnosis not present

## 2019-05-22 DIAGNOSIS — I6523 Occlusion and stenosis of bilateral carotid arteries: Secondary | ICD-10-CM

## 2019-05-22 DIAGNOSIS — I700 Atherosclerosis of aorta: Secondary | ICD-10-CM

## 2019-05-22 DIAGNOSIS — C7951 Secondary malignant neoplasm of bone: Secondary | ICD-10-CM

## 2019-05-22 DIAGNOSIS — J449 Chronic obstructive pulmonary disease, unspecified: Secondary | ICD-10-CM

## 2019-05-22 DIAGNOSIS — Z7982 Long term (current) use of aspirin: Secondary | ICD-10-CM

## 2019-05-22 DIAGNOSIS — I10 Essential (primary) hypertension: Secondary | ICD-10-CM

## 2019-05-22 DIAGNOSIS — G4730 Sleep apnea, unspecified: Secondary | ICD-10-CM

## 2019-05-22 DIAGNOSIS — F329 Major depressive disorder, single episode, unspecified: Secondary | ICD-10-CM

## 2019-05-22 DIAGNOSIS — F419 Anxiety disorder, unspecified: Secondary | ICD-10-CM

## 2019-05-22 DIAGNOSIS — I2510 Atherosclerotic heart disease of native coronary artery without angina pectoris: Secondary | ICD-10-CM

## 2019-05-22 DIAGNOSIS — C787 Secondary malignant neoplasm of liver and intrahepatic bile duct: Secondary | ICD-10-CM | POA: Diagnosis not present

## 2019-05-22 DIAGNOSIS — K219 Gastro-esophageal reflux disease without esophagitis: Secondary | ICD-10-CM

## 2019-05-22 DIAGNOSIS — E785 Hyperlipidemia, unspecified: Secondary | ICD-10-CM

## 2019-05-22 DIAGNOSIS — Z79899 Other long term (current) drug therapy: Secondary | ICD-10-CM

## 2019-05-22 DIAGNOSIS — J948 Other specified pleural conditions: Secondary | ICD-10-CM | POA: Diagnosis not present

## 2019-05-22 DIAGNOSIS — E860 Dehydration: Secondary | ICD-10-CM

## 2019-05-22 DIAGNOSIS — R1012 Left upper quadrant pain: Secondary | ICD-10-CM | POA: Diagnosis not present

## 2019-05-22 DIAGNOSIS — Z5111 Encounter for antineoplastic chemotherapy: Secondary | ICD-10-CM

## 2019-05-22 DIAGNOSIS — R634 Abnormal weight loss: Secondary | ICD-10-CM

## 2019-05-22 DIAGNOSIS — Z95828 Presence of other vascular implants and grafts: Secondary | ICD-10-CM

## 2019-05-22 DIAGNOSIS — D61810 Antineoplastic chemotherapy induced pancytopenia: Secondary | ICD-10-CM

## 2019-05-22 DIAGNOSIS — R630 Anorexia: Secondary | ICD-10-CM

## 2019-05-22 DIAGNOSIS — T451X5A Adverse effect of antineoplastic and immunosuppressive drugs, initial encounter: Secondary | ICD-10-CM

## 2019-05-22 LAB — CBC WITH DIFFERENTIAL (CANCER CENTER ONLY)
Abs Immature Granulocytes: 0 10*3/uL (ref 0.00–0.07)
Basophils Absolute: 0 10*3/uL (ref 0.0–0.1)
Basophils Relative: 0 %
Eosinophils Absolute: 0 10*3/uL (ref 0.0–0.5)
Eosinophils Relative: 1 %
HCT: 26.6 % — ABNORMAL LOW (ref 39.0–52.0)
Hemoglobin: 8.6 g/dL — ABNORMAL LOW (ref 13.0–17.0)
Lymphocytes Relative: 9 %
Lymphs Abs: 0.3 10*3/uL — ABNORMAL LOW (ref 0.7–4.0)
MCH: 27.6 pg (ref 26.0–34.0)
MCHC: 32.3 g/dL (ref 30.0–36.0)
MCV: 85.3 fL (ref 80.0–100.0)
Monocytes Absolute: 0 10*3/uL — ABNORMAL LOW (ref 0.1–1.0)
Monocytes Relative: 0 %
Neutro Abs: 3.2 10*3/uL (ref 1.7–17.7)
Neutrophils Relative %: 90 %
Platelet Count: 76 10*3/uL — ABNORMAL LOW (ref 150–400)
RBC: 3.12 MIL/uL — ABNORMAL LOW (ref 4.22–5.81)
RDW: 13.5 % (ref 11.5–15.5)
WBC Count: 3.6 10*3/uL — ABNORMAL LOW (ref 4.0–10.5)
nRBC: 0 % (ref 0.0–0.2)

## 2019-05-22 LAB — CMP (CANCER CENTER ONLY)
ALT: 22 U/L (ref 0–44)
AST: 20 U/L (ref 15–41)
Albumin: 3 g/dL — ABNORMAL LOW (ref 3.5–5.0)
Alkaline Phosphatase: 55 U/L (ref 38–126)
Anion gap: 9 (ref 5–15)
BUN: 21 mg/dL — ABNORMAL HIGH (ref 6–20)
CO2: 26 mmol/L (ref 22–32)
Calcium: 8.3 mg/dL — ABNORMAL LOW (ref 8.9–10.3)
Chloride: 108 mmol/L (ref 98–111)
Creatinine: 1.5 mg/dL — ABNORMAL HIGH (ref 0.61–1.24)
GFR, Est AFR Am: >60 mL/min (ref >60)
GFR, Estimated: 53 mL/min — ABNORMAL LOW (ref >60)
Glucose, Bld: 110 mg/dL — ABNORMAL HIGH (ref 70–99)
Potassium: 3.6 mmol/L (ref 3.5–5.1)
Sodium: 143 mmol/L (ref 135–145)
Total Bilirubin: 0.4 mg/dL (ref 0.3–1.2)
Total Protein: 6.1 g/dL — ABNORMAL LOW (ref 6.5–8.1)

## 2019-05-22 LAB — MAGNESIUM: Magnesium: 1.8 mg/dL (ref 1.7–2.4)

## 2019-05-22 MED ORDER — OXYCODONE-ACETAMINOPHEN 5-325 MG PO TABS: 1.0000 | ORAL_TABLET | Freq: Four times a day (QID) | ORAL | 0 refills | Status: DC | PRN

## 2019-05-22 MED ORDER — SODIUM CHLORIDE 0.9% FLUSH
10.0000 mL | INTRAVENOUS | Status: DC | PRN
  Administered 2019-05-22: 10 mL
  Filled 2019-05-22: qty 10

## 2019-05-22 MED ORDER — ALTEPLASE 2 MG IJ SOLR
2.0000 mg | Freq: Once | INTRAMUSCULAR | Status: DC | PRN
  Filled 2019-05-22: qty 2

## 2019-05-22 MED FILL — OXYCODONE-ACETAMINOPHEN 5-3: 5-325 | 15 days supply | Qty: 60 | Fill #0

## 2019-05-22 NOTE — Patient Instructions (Signed)

## 2019-05-22 NOTE — Telephone Encounter (Signed)
[place] Work  Clinical Social Work received phone call requesting a gas card and status of light bill payment.  CSW notified patient he has received maximum number of gas cards from [facility].  CSW is unaware of light bill payment, suggested patient contact financial advocate team.  Gwinda [place], [place]

## 2019-05-22 NOTE — Progress Notes (Signed)
Nutrition Follow-up:  RD working remotely.  Patient with small cell lung cancer and followed by Dr. Earlie Server.  Patient restarting treatment.  Had PEG tube reinserted on 6/25.   Called for nutrition follow-up.  Patient reports that he is taking 2 1/2 cartons of Two Cal at this time and tolerating well.  Reports that he will be up to 4 cartons per day by the end of the week.  Reports that he is still trying to eat orally.  Ate spaghetti and meatballs last night for dinner.  Continues to drink fluids (water, 2, 20 oz bottles yesterday, soda, etc).  Reports some nausea but has medication on hand.  Reports have 3 bowel movements yesterday, not diarrhea, normal in consistency.  Reports normal for him is 2-3 per day.      Medications: reviewed  Labs: BUN 21, creatinine 1.50  Anthropometrics:   Weight 161 lb today decreased from 163 lb (after fluids in ED)   Estimated Energy Needs  Kcals: 7846-9629 Protein: 106-124 g Fluid: 2.4 L  NUTRITION DIAGNOSIS: Inadequate oral intake continues   INTERVENTION:  Recommend 4 cartons, Two Cal and 84ml promod TID.  Patient wants to give promod at 7, 11 and 3pm with feeding.   Reviewed tube feeding regimen with patient and how to give promod. Patient verbalized understanding and reports received mailed copy of tube feeding regimen.   Tube feeding will provide 2200 kcals, 109 g protein and 1504 ml free water.  Patient will need to drink at least additional 1-1.5 liters daily to better meet hydration needs.  Discussed this with patient today.   Patient reports that he has supplies and home health nurse has made contact with him.   Contact information provided    MONITORING, EVALUATION, GOAL: patient will consume adequate calories and protein to prevent weight loss.     NEXT VISIT: phone call July 7  Azalia Neuberger B. Zenia Resides, [place], [place] Registered Dietitian [phone] (pager)

## 2019-05-22 NOTE — Progress Notes (Signed)
[facility] [phone]   [phone]  OFFICE PROGRESS NOTE  [name], MD [place] [id number]  DIAGNOSIS: DIAGNOSIS: Initially diagnosed as limited stage (T3, N3,M0)small cell lung cancer. He presented with right paratracheal mass in addition to right suprahilar mass and lymphadenopathy as well as right cervical lymph node diagnosed in October 2019. He showed evidence of metastatic disease to the spine, liver, as well as suspicious pleural based metastasis in June 2020.   PRIOR THERAPY: Systemic chemotherapy with cisplatin 80 mg/M2 on day 1 and etoposide 100 mg/M2 on days 1, 2 and 3 every 3 weeks. Status post 3 cycles. This will be concurrent with radiotherapy with the start of cycle #2. Starting from cycle #4 his dose of cisplatin would be 60 mg/M2 and etoposide 90 mg/M2. Last dose was given November 23, 2018.  CURRENT THERAPY: Systemic chemotherapy with carboplatin for an AUC of 5 on days 1 and etoposide 100 mg/m2 on days 1,2, and 3 and Imfinzi 1500 mg IV every 3 weeks with Neulasta support. First dose expected on 05/14/2019.   Status post 1 cycle.  INTERVAL HISTORY: Andrew West 53 y.o. male returns to the clinic today for follow-up visit.  The patient is feeling fine except for the back pain and he is currently on Percocet supposedly every 6 hours but he takes it more than prescribed.  He denied having any shortness of breath, cough or hemoptysis.  He has few episodes of nausea after the treatment.  He also has fatigue and weakness after the Neulasta injection.  The patient denied having any headache or visual changes.  He had MRI of the brain that showed no evidence for metastatic disease to the brain.  He tolerated the first week of his treatment fairly well.  He is here today for evaluation and repeat blood work.  He had a PEG tube placed for supplemental nutrition.   MEDICAL HISTORY: Past Medical History:    Diagnosis Date   Anxiety    CAD (coronary artery disease)    a. s/p multiple PCIs with last cath 11/2016 with severe multivessel CAD, s/p PCTA to LCx but unable to pass stent   Cancer (HCC)    Chronic leg pain    bilateral   Chronic lower back pain    COPD (chronic obstructive pulmonary disease) (HCC)    Depression    GERD (gastroesophageal reflux disease)    Takes Dexilant   HLD (hyperlipidemia)    Hypertension    Rhabdomyolysis    h/o, r/t statins   Sleep apnea    "can't tolerate mask" (12/16/2016)   Type II diabetes mellitus (HCC)     ALLERGIES:  is allergic to iohexol.  MEDICATIONS:  Current Outpatient Medications  Medication Sig Dispense Refill   ACCU-CHEK FASTCLIX LANCETS MISC Use as directed 102 each 12   acetaminophen (TYLENOL) 500 MG tablet Take 1,000 mg by mouth every 6 (six) hours as needed for moderate pain.     acetaminophen-codeine (TYLENOL #3) 300-30 MG tablet Take 1 tablet by mouth every 12 (twelve) hours as needed for moderate pain. Dx; lumbar radiculopathy, lung ca 30 tablet 0   albuterol (VENTOLIN HFA) 108 (90 Base) MCG/ACT inhaler Inhale 1 puff into the lungs every 6 (six) hours as needed for wheezing or shortness of breath. 54 g 1   aspirin 81 MG tablet Take 1 tablet (81 mg total) daily by mouth. 30 tablet    cetirizine (ZYRTEC)  10 MG tablet Take 1 tablet (10 mg total) by mouth daily. 30 tablet 1   Cholecalciferol (VITAMIN D) 2000 units tablet Take 1 tablet (2,000 Units total) by mouth daily. 30 tablet 1   clopidogrel (PLAVIX) 75 MG tablet Take 1 tablet (75 mg total) by mouth daily. YOU MAY RESTART 08/26/2018 90 tablet 3   diclofenac sodium (VOLTAREN) 1 % GEL Apply 2 g topically 4 (four) times daily. (Patient taking differently: Apply 2 g topically 4 (four) times daily as needed (pain). ) 1 Tube 0   dronabinol (MARINOL) 5 MG capsule Take 1 capsule (5 mg total) by mouth 2 (two) times daily before a meal. 60 capsule 0   DULoxetine  (CYMBALTA) 60 MG capsule Take 1 capsule (60 mg total) by mouth daily. 90 capsule 1   Fluticasone-Salmeterol (ADVAIR) 100-50 MCG/DOSE AEPB Inhale 1 puff into the lungs 2 (two) times daily. 3 each 1   glucose blood (ACCU-CHEK GUIDE) test strip Use as instructed 100 each 12   HYDROcodone-acetaminophen (NORCO/VICODIN) 5-325 MG tablet Take 1 tablet by mouth every 6 (six) hours as needed. 8 tablet 0   levofloxacin (LEVAQUIN) 500 MG tablet Take 1 tablet (500 mg total) by mouth daily. 7 tablet 0   LORazepam (ATIVAN) 0.5 MG tablet 1 tab po q 4-6 hours prn or 1 tab po 30 minutes prior to radiation (Patient taking differently: Take 0.5 mg by mouth every 6 (six) hours as needed for anxiety or sedation. 1 tab po q 4-6 hours prn or 1 tab po 30 minutes prior to radiation) 30 tablet 0   mirtazapine (REMERON) 30 MG tablet Take 1 tablet (30 mg total) by mouth at bedtime. 30 tablet 3   nebivolol (BYSTOLIC) 5 MG tablet Take 1 tablet (5 mg total) by mouth daily. 90 tablet 3   nitroGLYCERIN (NITROSTAT) 0.4 MG SL tablet Place 1 tablet (0.4 mg total) under the tongue every 5 (five) minutes as needed for chest pain. 25 tablet 2   ondansetron (ZOFRAN ODT) 4 MG disintegrating tablet Take 1 tablet (4 mg total) by mouth every 8 (eight) hours as needed for nausea or vomiting. 20 tablet 0   oxyCODONE-acetaminophen (PERCOCET/ROXICET) 5-325 MG tablet Take 1 tablet by mouth every 6 (six) hours as needed for severe pain. 60 tablet 0   pantoprazole (PROTONIX) 40 MG tablet Take 1 tablet (40 mg total) by mouth daily. 90 tablet 1   prochlorperazine (COMPAZINE) 10 MG tablet Take 1 tablet (10 mg total) by mouth every 6 (six) hours as needed for nausea or vomiting. 30 tablet 0   promethazine (PHENERGAN) 25 MG suppository Place 1 suppository (25 mg total) rectally every 6 (six) hours as needed for nausea or vomiting. 12 each 0   ranolazine (RANEXA) 1000 MG SR tablet Take 1 tablet (1,000 mg total) by mouth 2 (two) times daily. 180  tablet 3   rosuvastatin (CRESTOR) 20 MG tablet Take 1 tablet (20 mg total) by mouth daily. 90 tablet 1   sucralfate (CARAFATE) 1 g tablet Take 1 tablet by mouth 4 (four) times daily -  before meals and at bedtime.  0   tiotropium (SPIRIVA HANDIHALER) 18 MCG inhalation capsule Place 1 capsule (18 mcg total) into inhaler and inhale daily. 90 capsule 1   vitamin B-12 (CYANOCOBALAMIN) 500 MCG tablet Take 1 tablet (500 mcg total) by mouth daily. 30 tablet 1   zolpidem (AMBIEN) 5 MG tablet Take 1 tablet (5 mg total) by mouth at bedtime. 30 tablet 3  No current facility-administered medications for this visit.     SURGICAL HISTORY:  Past Surgical History:  Procedure Laterality Date   BACK SURGERY     BRONCHIAL BIOPSY  08/25/2018   Procedure: BRONCHIAL BIOPSIES;  Surgeon: Garner Nash, DO;  Location: [facility];  Service: Cardiopulmonary;;   CARDIAC CATHETERIZATION N/A 09/25/2015   Procedure: Left Heart Cath and Coronary Angiography;  Surgeon: Leonie Man, MD;  Location: [facility];  Service: Cardiovascular;  Laterality: N/A;   CARDIAC CATHETERIZATION N/A 12/16/2016   Procedure: Left Heart Cath and Coronary Angiography;  Surgeon: Leonie Man, MD;  Location: [facility];  Service: Cardiovascular;  Laterality: N/A;   CARDIAC CATHETERIZATION N/A 12/16/2016   Procedure: Coronary Balloon Angioplasty;  Surgeon: Leonie Man, MD;  Location: [facility];  Service: Cardiovascular;  Laterality: N/A;   COLONOSCOPY W/ POLYPECTOMY     CORONARY ANGIOPLASTY  09/25/2015   mid cir & om   CORONARY ANGIOPLASTY WITH STENT PLACEMENT  10/09/2001   PTCA & stenting of mid AV circumflex; 2.5x60m Pixel stent   CORONARY ANGIOPLASTY WITH STENT PLACEMENT  12/13/2001   PCI with stent to mid L circumflex, 95% stenosis to 0% residual   CORONARY ANGIOPLASTY WITH STENT PLACEMENT  10/10/2003   PCI to mid AV circumflex; LAD 30% disease; RCA 100% occluded prox.   CORONARY  ANGIOPLASTY WITH STENT PLACEMENT  09/01/2011   PCI with stenting with bare metal stent to mid AV groove circumflex and PDA   CORONARY ANGIOPLASTY WITH STENT PLACEMENT  10/17/2011   cutting balloon angioplasty of ostial lateral OM1 branch and bifurcation AV groove circumflex OM junction; stenosis reduced to 0%   ENDOBRONCHIAL ULTRASOUND Bilateral 08/25/2018   Procedure: ENDOBRONCHIAL ULTRASOUND;  Surgeon: IGarner Nash DO;  Location: [facility];  Service: Cardiopulmonary;  Laterality: Bilateral;   ESOPHAGOGASTRODUODENOSCOPY (EGD) WITH PROPOFOL N/A 12/08/2018   Procedure: ESOPHAGOGASTRODUODENOSCOPY (EGD) WITH PROPOFOL;  Surgeon: JMilus Banister MD;  Location: [facility];  Service: Endoscopy;  Laterality: N/A;   EXCISIONAL HEMORRHOIDECTOMY     FINE NEEDLE ASPIRATION  08/25/2018   Procedure: FINE NEEDLE ASPIRATION;  Surgeon: IGarner Nash DO;  Location: [facility];  Service: Cardiopulmonary;;   FLEXIBLE BRONCHOSCOPY  08/25/2018   Procedure: FLEXIBLE BRONCHOSCOPY;  Surgeon: IGarner Nash DO;  Location: [facility];  Service: Cardiopulmonary;;   IR GASTROSTOMY TUBE MOD SED  12/11/2018   IR GASTROSTOMY TUBE MOD SED  05/17/2019   IR GASTROSTOMY TUBE REMOVAL  03/22/2019   IR IMAGING GUIDED PORT INSERTION  09/29/2018   LEFT HEART CATHETERIZATION WITH CORONARY ANGIOGRAM N/A 10/18/2011   Procedure: LEFT HEART CATHETERIZATION WITH CORONARY ANGIOGRAM;  Surgeon: DLeonie Man MD;  [facility];  Service: Cardiovascular;  Laterality: N/A;   LUMBAR LAMINECTOMY/DECOMPRESSION MICRODISCECTOMY  03/31/2012   Procedure: LUMBAR LAMINECTOMY/DECOMPRESSION MICRODISCECTOMY 1 LEVEL;  Surgeon: HCharlie Pitter MD;  [facility];  Service: Neurosurgery;  Laterality: Left;   TRANSTHORACIC ECHOCARDIOGRAM  07/28/2011   EF 55-65%; LVH, grade 1 diastolic dysfunction;     REVIEW OF SYSTEMS:  A comprehensive review of systems was negative except for: Constitutional: positive for fatigue  and weight loss Respiratory: positive for dyspnea on exertion Gastrointestinal: positive for nausea Musculoskeletal: positive for muscle weakness   PHYSICAL EXAMINATION: General appearance: alert, cooperative, fatigued and no distress Head: Normocephalic, without obvious abnormality, atraumatic Neck: no adenopathy, no JVD, supple, symmetrical, trachea midline and thyroid not enlarged, symmetric, no tenderness/mass/nodules Lymph nodes: Cervical, supraclavicular, and  axillary nodes normal. Resp: clear to auscultation bilaterally Back: symmetric, no curvature. ROM normal. No CVA tenderness. Cardio: regular rate and rhythm, S1, S2 normal, no murmur, click, rub or gallop GI: soft, non-tender; bowel sounds normal; no masses,  no organomegaly Extremities: extremities normal, atraumatic, no cyanosis or edema  ECOG PERFORMANCE STATUS: 1 - Symptomatic but completely ambulatory  Blood pressure 109/67, pulse 87, temperature 98.7 F (37.1 C), temperature source Oral, resp. rate 18, height _0  (1.753 m), weight 161 lb 9.6 oz (73.3 kg), SpO2 100 %.  LABORATORY DATA: Lab Results  Component Value Date   WBC 5.5 05/16/2019   HGB 9.6 (L) 05/16/2019   HCT 30.1 (L) 05/16/2019   MCV 86.2 05/16/2019   PLT 148 (L) 05/16/2019      Chemistry      Component Value Date/Time   NA 145 05/16/2019 0815   NA 144 07/25/2018 0949   K 3.8 05/16/2019 0815   CL 108 05/16/2019 0815   CO2 26 05/16/2019 0815   BUN 16 05/16/2019 0815   BUN 10 07/25/2018 0949   CREATININE 1.94 (H) 05/16/2019 0815   CREATININE 0.96 12/13/2016 1151      Component Value Date/Time   CALCIUM 8.8 (L) 05/16/2019 0815   ALKPHOS 62 05/16/2019 0815   AST 18 05/16/2019 0815   ALT 9 05/16/2019 0815   BILITOT 0.4 05/16/2019 0815       RADIOGRAPHIC STUDIES: Mr Jeri Cos EG Contrast  Result Date: 05/21/2019 CLINICAL DATA:  53 year old male with small cell lung cancer, status post prophylactic all brain radiation completing on  02/27/2019. Restaging. EXAM: MRI HEAD WITHOUT AND WITH CONTRAST TECHNIQUE: Multiplanar, multiecho pulse sequences of the brain and surrounding structures were obtained without and with intravenous contrast. CONTRAST:  7 milliliters Gadavist COMPARISON:  Brain MRI 01/25/2019 and earlier. FINDINGS: Brain: No abnormal enhancement identified. No midline shift, mass effect, or evidence of intracranial mass lesion. No dural thickening. No restricted diffusion to suggest acute infarction. No ventriculomegaly, extra-axial collection or acute intracranial hemorrhage. Cervicomedullary junction and pituitary are within normal limits. Stable cerebral volume. Stable minimal nonspecific cerebral white matter T2 and FLAIR hyperintensity. No cortical encephalomalacia or chronic blood products. Vascular: Major intracranial vascular flow voids Are stable. The distal left vertebral artery appears mildly dominant. The major dural venous sinuses are enhancing and appear to be patent. Skull and upper cervical spine: Negative visible cervical spine and spinal cord. Visualized bone marrow signal is within normal limits. Sinuses/Orbits: Negative orbits. Stable small right maxillary sinus mucous retention cyst. Other: Mastoids remain clear. Visible internal auditory structures appear normal. Stable scalp and face soft tissues. IMPRESSION: No metastatic disease or acute intracranial abnormality identified status post whole brain radiation. Electronically Signed   By: Genevie Ann M.D.   On: 05/21/2019 20:40   Ir Gastrostomy Tube  Result Date: 05/17/2019 INDICATION: 53 year old with metastatic small cell lung cancer. Patient has malnutrition and poor oral intake. Prior gastrostomy tube was removed earlier in the year. EXAM: PERCUTANEOUS GASTROSTOMY TUBE WITH FLUOROSCOPIC GUIDANCE Physician: Stephan Minister. Anselm Pancoast, MD MEDICATIONS: Ancef 2 g; Antibiotics were administered within 1 hour of the procedure. ANESTHESIA/SEDATION: Versed 2.0 mg IV; Fentanyl 100  mcg IV Moderate Sedation Time:  34 minutes The patient was continuously monitored during the procedure by the interventional radiology nurse under my direct supervision. FLUOROSCOPY TIME:  Fluoroscopy Time: 2 minutes, 48 seconds, 11 mGy COMPLICATIONS: None immediate. PROCEDURE: The procedure was explained to the patient. The risks and benefits of the procedure were discussed  and the patient's questions were addressed. Informed consent was obtained from the patient. Transverse colon contains gas and stool and identified with fluoroscopy. The patient was placed on the interventional table. An orogastric tube was placed with fluoroscopic guidance. The anterior abdomen was prepped and draped in sterile fashion. Maximal barrier sterile technique was utilized including caps, mask, sterile gowns, sterile gloves, sterile drape, hand hygiene and skin antiseptic. Stomach was inflated with air through the orogastric tube. The skin and subcutaneous tissues were anesthetized with 1% lidocaine. Incision was made at the old gastrostomy tube site. A 17 gauge needle was directed into the distended stomach with fluoroscopic guidance. A stiff Amplatz wire was advanced into the stomach. A 9-French vascular sheath was placed and the orogastric tube was snared using a Gooseneck snare device. The orogastric tube and snare were pulled out of the patient's mouth. The snare device was connected to a 24-French gastrostomy tube. The snare device and gastrostomy tube were pulled through the patient's mouth and out the anterior abdominal wall. The gastrostomy tube was cut to an appropriate length. Air was injected through gastrostomy tube and confirmed placement within the stomach. Fluoroscopic images were obtained for documentation. The gastrostomy tube was flushed with normal saline. IMPRESSION: Successful fluoroscopic guided percutaneous gastrostomy tube placement. Electronically Signed   By: Markus Daft M.D.   On: 05/17/2019 16:14   Nm Pet  Image Initial (pi) Skull Base To Thigh  Result Date: 05/03/2019 CLINICAL DATA:  Subsequent treatment strategy for staging of lung cancer. EXAM: NUCLEAR MEDICINE PET SKULL BASE TO THIGH TECHNIQUE: 8.7 mCi F-18 FDG was injected intravenously. Full-ring PET imaging was performed from the skull base to thigh after the radiotracer. CT data was obtained and used for attenuation correction and anatomic localization. Fasting blood glucose: 70 mg/dl COMPARISON:  PET of 08/21/2018.  Diagnostic CTs of 03/04/2019. FINDINGS: Mediastinal blood pool activity: SUV max 2.4 Liver activity: SUV max NA NECK: No areas of abnormal hypermetabolism. Incidental CT findings: Right maxillary sinus mucous retention cyst or polyp. Bilateral carotid atherosclerosis. CHEST: The previously the described middle mediastinal and right hilar nodal hypermetabolism has resolved. A node at the level of the thoracic inlet measures 8 mm and a S.U.V. max of 7.4 on image 46/4. Vague, posterior right upper lobe 1.3 cm pulmonary opacity with hypermetabolism measures a S.U.V. max of 3.9 on image 22/8 and is new since 03/04/2019. Multifocal low-level pleural hypermetabolism with concurrent pleural thickening. Example at a S.U.V. max of 2.5 on image 89/4. Incidental CT findings: Resolution of right pleural fluid. Multivessel coronary artery atherosclerosis. Aortic atherosclerosis. Left Port-A-Cath tip low SVC. Centrilobular and paraseptal emphysema. ABDOMEN/PELVIS: Hypermetabolic high left hepatic lobe 11 mm lesion on image 102/4 measures a S.U.V. max of 10.0. A vague area of more mild right hepatic lobe hypermetabolism is without well-defined CT correlate and measures a S.U.V. max of 4.5, including on approximately image 108/4. No abdominopelvic nodal hypermetabolism. Likely physiologic anal hypermetabolism without CT correlate. This measures a S.U.V. max of 6.9. Incidental CT findings: Abdominal aortic and branch vessel atherosclerosis. Large colonic stool  burden. SKELETON: Development of multifocal osseous metastasis. Examples within the right side of the C2 vertebral body and anterior right femoral neck. Index lesion within the L4 vertebral body measures a S.U.V. max of 16.3. Incidental CT findings: none IMPRESSION: 1. Since the PET of 08/21/2018, mixed response to therapy. 2. The right paratracheal and right hilar nodal hypermetabolism has resolved. However, there has been interval development of hepatic and osseous metastasis, as  well as a new hypermetabolic node at the level of the thoracic inlet. 3. Foci of right-sided pleural based hypermetabolism are indeterminate but suspicious for pleural metastasis. 4. Aortic atherosclerosis (ICD10-I70.0), coronary artery atherosclerosis and emphysema (ICD10-J43.9). 5. Right upper lobe hypermetabolic pulmonary opacity, favored to represent an area of infection or inflammation. Electronically Signed   By: Abigail Miyamoto M.D.   On: 05/03/2019 09:24    ASSESSMENT AND PLAN: This is a very pleasant 53 year old African-American male who was initially diagnosed with limited stage small cell lung cancer.  He presented with a right paratracheal and a right suprahilar mass with right cervical lymphadenopathy.  He was diagnosed in October 2019.  He previously went systemic chemotherapy in addition to radiation.  He was admitted to the hospital for almost every cycle of chemotherapy with significant pancytopenia. His last dose was completed on 11/23/2018. He has been on observation since this time. He also experienced dysphasia and odynophagia which required a PEG tube for nutrition.  His PEG tube was removed on 03/22/2019.  The patient had evidence for disease progression and he started treatment again with carboplatin, etoposide and Imfinzi status post 1 cycle started last week.  He tolerated the first cycle of his treatment well except for the fatigue and weakness after the Neulasta injection. I recommended for the patient to  continue his treatment and he is expected to start cycle #2 in 2 weeks. For the malnutrition and weight loss, he will continue with supplemental nutrition via the PEG tube. For pain management I gave him refill of Percocet today.  I would also continue his palliative radiotherapy to the metastatic disease in the bone.  He is also scheduled for evaluation by the pain clinic on May 30, 2019. I will see him back for follow-up visit in 2 weeks for evaluation before starting cycle #2. He was advised to call immediately if he has any concerning symptoms in the interval. The patient voices understanding of current disease status and treatment options and is in agreement with the current care plan.  All questions were answered. The patient knows to call the clinic with any problems, questions or concerns. We can certainly see the patient much sooner if necessary.  I spent 10 minutes counseling the patient face to face. The total time spent in the appointment was 15 minutes.  Disclaimer: This note was dictated with voice recognition software. Similar sounding words can inadvertently be transcribed and may not be corrected upon review.

## 2019-05-23 ENCOUNTER — Other Ambulatory Visit: Payer: Medicare Other

## 2019-05-23 ENCOUNTER — Telehealth: Payer: Self-pay | Admitting: Family Medicine

## 2019-05-23 ENCOUNTER — Ambulatory Visit
Admission: RE | Admit: 2019-05-23 | Discharge: 2019-05-23 | Disposition: A | Discharge status: Home / Self Care | Payer: Medicare Other | Source: Ambulatory Visit | Attending: Radiation Oncology | Admitting: Radiation Oncology

## 2019-05-23 ENCOUNTER — Ambulatory Visit: Payer: Medicare Other | Admitting: Physician Assistant

## 2019-05-23 ENCOUNTER — Telehealth: Payer: Self-pay | Admitting: Internal Medicine

## 2019-05-23 ENCOUNTER — Other Ambulatory Visit: Payer: Self-pay

## 2019-05-23 ENCOUNTER — Inpatient Hospital Stay (HOSPITAL_COMMUNITY)
Admission: EM | Admit: 2019-05-23 | Discharge: 2019-05-27 | DRG: 871 | Disposition: A | Discharge status: Home Health | Payer: Medicare Other | Attending: Internal Medicine | Admitting: Internal Medicine

## 2019-05-23 ENCOUNTER — Emergency Department (HOSPITAL_COMMUNITY): Payer: Medicare Other

## 2019-05-23 ENCOUNTER — Encounter (HOSPITAL_COMMUNITY): Payer: Self-pay | Admitting: Family Medicine

## 2019-05-23 DIAGNOSIS — Z79899 Other long term (current) drug therapy: Secondary | ICD-10-CM

## 2019-05-23 DIAGNOSIS — Z9861 Coronary angioplasty status: Secondary | ICD-10-CM

## 2019-05-23 DIAGNOSIS — E43 Unspecified severe protein-calorie malnutrition: Secondary | ICD-10-CM | POA: Diagnosis not present

## 2019-05-23 DIAGNOSIS — F329 Major depressive disorder, single episode, unspecified: Secondary | ICD-10-CM | POA: Diagnosis present

## 2019-05-23 DIAGNOSIS — Z20828 Contact with and (suspected) exposure to other viral communicable diseases: Secondary | ICD-10-CM | POA: Diagnosis present

## 2019-05-23 DIAGNOSIS — C787 Secondary malignant neoplasm of liver and intrahepatic bile duct: Secondary | ICD-10-CM | POA: Diagnosis not present

## 2019-05-23 DIAGNOSIS — Z7951 Long term (current) use of inhaled steroids: Secondary | ICD-10-CM

## 2019-05-23 DIAGNOSIS — G4733 Obstructive sleep apnea (adult) (pediatric): Secondary | ICD-10-CM | POA: Diagnosis not present

## 2019-05-23 DIAGNOSIS — C3411 Malignant neoplasm of upper lobe, right bronchus or lung: Secondary | ICD-10-CM | POA: Diagnosis not present

## 2019-05-23 DIAGNOSIS — Z6823 Body mass index (BMI) 23.0-23.9, adult: Secondary | ICD-10-CM

## 2019-05-23 DIAGNOSIS — E119 Type 2 diabetes mellitus without complications: Secondary | ICD-10-CM

## 2019-05-23 DIAGNOSIS — G4730 Sleep apnea, unspecified: Secondary | ICD-10-CM | POA: Diagnosis present

## 2019-05-23 DIAGNOSIS — Z87891 Personal history of nicotine dependence: Secondary | ICD-10-CM

## 2019-05-23 DIAGNOSIS — I10 Essential (primary) hypertension: Secondary | ICD-10-CM | POA: Diagnosis present

## 2019-05-23 DIAGNOSIS — T451X5A Adverse effect of antineoplastic and immunosuppressive drugs, initial encounter: Secondary | ICD-10-CM | POA: Diagnosis present

## 2019-05-23 DIAGNOSIS — J449 Chronic obstructive pulmonary disease, unspecified: Secondary | ICD-10-CM | POA: Diagnosis not present

## 2019-05-23 DIAGNOSIS — C7951 Secondary malignant neoplasm of bone: Secondary | ICD-10-CM | POA: Diagnosis not present

## 2019-05-23 DIAGNOSIS — Z7982 Long term (current) use of aspirin: Secondary | ICD-10-CM

## 2019-05-23 DIAGNOSIS — C3401 Malignant neoplasm of right main bronchus: Secondary | ICD-10-CM | POA: Diagnosis not present

## 2019-05-23 DIAGNOSIS — R3911 Hesitancy of micturition: Secondary | ICD-10-CM | POA: Diagnosis not present

## 2019-05-23 DIAGNOSIS — N183 Chronic kidney disease, stage 3 unspecified: Secondary | ICD-10-CM | POA: Diagnosis present

## 2019-05-23 DIAGNOSIS — I2510 Atherosclerotic heart disease of native coronary artery without angina pectoris: Secondary | ICD-10-CM | POA: Diagnosis not present

## 2019-05-23 DIAGNOSIS — Z7902 Long term (current) use of antithrombotics/antiplatelets: Secondary | ICD-10-CM

## 2019-05-23 DIAGNOSIS — K219 Gastro-esophageal reflux disease without esophagitis: Secondary | ICD-10-CM | POA: Diagnosis not present

## 2019-05-23 DIAGNOSIS — N401 Enlarged prostate with lower urinary tract symptoms: Secondary | ICD-10-CM | POA: Diagnosis present

## 2019-05-23 DIAGNOSIS — A419 Sepsis, unspecified organism: Principal | ICD-10-CM | POA: Diagnosis present

## 2019-05-23 DIAGNOSIS — R531 Weakness: Secondary | ICD-10-CM | POA: Diagnosis not present

## 2019-05-23 DIAGNOSIS — D709 Neutropenia, unspecified: Secondary | ICD-10-CM | POA: Diagnosis present

## 2019-05-23 DIAGNOSIS — Z431 Encounter for attention to gastrostomy: Secondary | ICD-10-CM | POA: Diagnosis not present

## 2019-05-23 DIAGNOSIS — D6959 Other secondary thrombocytopenia: Secondary | ICD-10-CM | POA: Diagnosis present

## 2019-05-23 DIAGNOSIS — E1122 Type 2 diabetes mellitus with diabetic chronic kidney disease: Secondary | ICD-10-CM | POA: Diagnosis present

## 2019-05-23 DIAGNOSIS — G8929 Other chronic pain: Secondary | ICD-10-CM | POA: Diagnosis present

## 2019-05-23 DIAGNOSIS — G609 Hereditary and idiopathic neuropathy, unspecified: Secondary | ICD-10-CM | POA: Diagnosis present

## 2019-05-23 DIAGNOSIS — J309 Allergic rhinitis, unspecified: Secondary | ICD-10-CM | POA: Diagnosis present

## 2019-05-23 DIAGNOSIS — E785 Hyperlipidemia, unspecified: Secondary | ICD-10-CM | POA: Diagnosis present

## 2019-05-23 DIAGNOSIS — R509 Fever, unspecified: Secondary | ICD-10-CM | POA: Diagnosis not present

## 2019-05-23 DIAGNOSIS — E782 Mixed hyperlipidemia: Secondary | ICD-10-CM

## 2019-05-23 DIAGNOSIS — E1142 Type 2 diabetes mellitus with diabetic polyneuropathy: Secondary | ICD-10-CM | POA: Diagnosis present

## 2019-05-23 DIAGNOSIS — R5081 Fever presenting with conditions classified elsewhere: Secondary | ICD-10-CM | POA: Diagnosis present

## 2019-05-23 DIAGNOSIS — Z8249 Family history of ischemic heart disease and other diseases of the circulatory system: Secondary | ICD-10-CM

## 2019-05-23 DIAGNOSIS — I130 Hypertensive heart and chronic kidney disease with heart failure and stage 1 through stage 4 chronic kidney disease, or unspecified chronic kidney disease: Secondary | ICD-10-CM | POA: Diagnosis present

## 2019-05-23 DIAGNOSIS — I5189 Other ill-defined heart diseases: Secondary | ICD-10-CM

## 2019-05-23 DIAGNOSIS — Z955 Presence of coronary angioplasty implant and graft: Secondary | ICD-10-CM

## 2019-05-23 DIAGNOSIS — I5032 Chronic diastolic (congestive) heart failure: Secondary | ICD-10-CM | POA: Diagnosis present

## 2019-05-23 DIAGNOSIS — Z91041 Radiographic dye allergy status: Secondary | ICD-10-CM

## 2019-05-23 DIAGNOSIS — D6481 Anemia due to antineoplastic chemotherapy: Secondary | ICD-10-CM | POA: Diagnosis present

## 2019-05-23 DIAGNOSIS — D61818 Other pancytopenia: Secondary | ICD-10-CM | POA: Diagnosis present

## 2019-05-23 DIAGNOSIS — Z833 Family history of diabetes mellitus: Secondary | ICD-10-CM

## 2019-05-23 DIAGNOSIS — F419 Anxiety disorder, unspecified: Secondary | ICD-10-CM | POA: Diagnosis present

## 2019-05-23 LAB — URINALYSIS, ROUTINE W REFLEX MICROSCOPIC
Bacteria, UA: NONE SEEN
Bilirubin Urine: NEGATIVE
Glucose, UA: NEGATIVE mg/dL
Ketones, ur: NEGATIVE mg/dL
Leukocytes,Ua: NEGATIVE
Nitrite: NEGATIVE
Protein, ur: NEGATIVE mg/dL
Specific Gravity, Urine: 1.014 (ref 1.005–1.030)
pH: 5 (ref 5.0–8.0)

## 2019-05-23 LAB — COMPREHENSIVE METABOLIC PANEL
ALT: 21 U/L (ref 0–44)
AST: 18 U/L (ref 15–41)
Albumin: 3.1 g/dL — ABNORMAL LOW (ref 3.5–5.0)
Alkaline Phosphatase: 45 U/L (ref 38–126)
Anion gap: 6 (ref 5–15)
BUN: 27 mg/dL — ABNORMAL HIGH (ref 6–20)
CO2: 28 mmol/L (ref 22–32)
Calcium: 8.4 mg/dL — ABNORMAL LOW (ref 8.9–10.3)
Chloride: 109 mmol/L (ref 98–111)
Creatinine, Ser: 1.42 mg/dL — ABNORMAL HIGH (ref 0.61–1.24)
GFR calc Af Amer: >60 mL/min (ref >60)
GFR calc non Af Amer: 56 mL/min — ABNORMAL LOW (ref >60)
Glucose, Bld: 90 mg/dL (ref 70–99)
Potassium: 3.9 mmol/L (ref 3.5–5.1)
Sodium: 143 mmol/L (ref 135–145)
Total Bilirubin: 0.3 mg/dL (ref 0.3–1.2)
Total Protein: 6.3 g/dL — ABNORMAL LOW (ref 6.5–8.1)

## 2019-05-23 LAB — CBC WITH DIFFERENTIAL/PLATELET
Band Neutrophils: 0 %
Basophils Absolute: 0 10*3/uL (ref 0.0–0.1)
Basophils Relative: 0 %
Eosinophils Absolute: 0 10*3/uL (ref 0.0–0.5)
Eosinophils Relative: 0 %
HCT: 24.2 % — ABNORMAL LOW (ref 39.0–52.0)
Hemoglobin: 7.9 g/dL — ABNORMAL LOW (ref 13.0–17.0)
Lymphocytes Relative: 0 %
Lymphs Abs: 0 10*3/uL — ABNORMAL LOW (ref 0.7–4.0)
MCH: 28.5 pg (ref 26.0–34.0)
MCHC: 32.6 g/dL (ref 30.0–36.0)
MCV: 87.4 fL (ref 80.0–100.0)
Monocytes Absolute: 0 10*3/uL — ABNORMAL LOW (ref 0.1–1.0)
Monocytes Relative: 0 %
Neutrophils Relative %: 0 %
Platelets: 42 10*3/uL — ABNORMAL LOW (ref 150–400)
RBC: 2.77 MIL/uL — ABNORMAL LOW (ref 4.22–5.81)
RDW: 13.4 % (ref 11.5–15.5)
WBC: 0.1 10*3/uL — CL (ref 4.0–10.5)
nRBC: 0 % (ref 0.0–0.2)
nRBC: 0 /100 WBC

## 2019-05-23 LAB — SARS CORONAVIRUS 2 BY RT PCR (HOSPITAL ORDER, PERFORMED IN ~~LOC~~ HOSPITAL LAB): SARS Coronavirus 2: NEGATIVE

## 2019-05-23 LAB — LACTIC ACID, PLASMA: Lactic Acid, Venous: 0.8 mmol/L (ref 0.5–1.9)

## 2019-05-23 LAB — GLUCOSE, CAPILLARY: Glucose-Capillary: 87 mg/dL (ref 70–99)

## 2019-05-23 LAB — PROTIME-INR
INR: 1.1 (ref 0.8–1.2)
Prothrombin Time: 13.9 seconds (ref 11.4–15.2)

## 2019-05-23 MED ORDER — OXYCODONE-ACETAMINOPHEN 5-325 MG PO TABS
1.0000 | ORAL_TABLET | Freq: Four times a day (QID) | ORAL | Status: DC | PRN
  Administered 2019-05-24 – 2019-05-27 (×7): 1 via ORAL
  Filled 2019-05-23 (×8): qty 1

## 2019-05-23 MED ORDER — SODIUM CHLORIDE 0.9% FLUSH
3.0000 mL | Freq: Once | INTRAVENOUS | Status: AC
  Administered 2019-05-23: 3 mL via INTRAVENOUS

## 2019-05-23 MED ORDER — HYDROCODONE-ACETAMINOPHEN 5-325 MG PO TABS
1.0000 | ORAL_TABLET | Freq: Four times a day (QID) | ORAL | Status: DC | PRN
  Administered 2019-05-23: 1 via ORAL
  Filled 2019-05-23: qty 1

## 2019-05-23 MED ORDER — INSULIN ASPART 100 UNIT/ML ~~LOC~~ SOLN: 0.0000 [IU] | SUBCUTANEOUS | Status: DC

## 2019-05-23 MED ORDER — VANCOMYCIN HCL 10 G IV SOLR
1500.0000 mg | Freq: Once | INTRAVENOUS | Status: AC
  Administered 2019-05-23: 1500 mg via INTRAVENOUS
  Filled 2019-05-23: qty 1500

## 2019-05-23 MED ORDER — MIRTAZAPINE 15 MG PO TABS
30.0000 mg | ORAL_TABLET | Freq: Every day | ORAL | Status: DC
  Administered 2019-05-23 – 2019-05-26 (×4): 30 mg via ORAL
  Filled 2019-05-23 (×4): qty 2

## 2019-05-23 MED ORDER — SODIUM CHLORIDE 0.9% FLUSH
10.0000 mL | Freq: Two times a day (BID) | INTRAVENOUS | Status: DC
  Administered 2019-05-24 – 2019-05-26 (×4): 10 mL

## 2019-05-23 MED ORDER — ACETAMINOPHEN 500 MG PO TABS
1000.0000 mg | ORAL_TABLET | Freq: Once | ORAL | Status: AC
  Administered 2019-05-23: 1000 mg via ORAL
  Filled 2019-05-23: qty 2

## 2019-05-23 MED ORDER — ACCU-CHEK FASTCLIX LANCETS MISC: 12 refills | Status: AC

## 2019-05-23 MED ORDER — FENTANYL CITRATE (PF) 100 MCG/2ML IJ SOLN
100.0000 ug | Freq: Once | INTRAMUSCULAR | Status: AC
  Administered 2019-05-23: 100 ug via INTRAVENOUS
  Filled 2019-05-23: qty 2

## 2019-05-23 MED ORDER — MORPHINE SULFATE (PF) 4 MG/ML IV SOLN
4.0000 mg | Freq: Once | INTRAVENOUS | Status: AC
  Administered 2019-05-23: 4 mg via INTRAVENOUS
  Filled 2019-05-23: qty 1

## 2019-05-23 MED ORDER — ACCU-CHEK GUIDE W/DEVICE KIT: 1.0000 | PACK | Freq: Every day | 0 refills | Status: AC

## 2019-05-23 MED ORDER — RANOLAZINE ER 500 MG PO TB12
1000.0000 mg | ORAL_TABLET | Freq: Two times a day (BID) | ORAL | Status: DC
  Administered 2019-05-23 – 2019-05-27 (×8): 1000 mg via ORAL
  Filled 2019-05-23 (×9): qty 2

## 2019-05-23 MED ORDER — SODIUM CHLORIDE 0.9 % IV SOLN
2.0000 g | Freq: Once | INTRAVENOUS | Status: AC
  Administered 2019-05-23: 2 g via INTRAVENOUS
  Filled 2019-05-23: qty 2

## 2019-05-23 MED ORDER — ONDANSETRON HCL 4 MG/2ML IJ SOLN
4.0000 mg | Freq: Four times a day (QID) | INTRAMUSCULAR | Status: DC | PRN
  Administered 2019-05-24 – 2019-05-25 (×2): 4 mg via INTRAVENOUS
  Filled 2019-05-23 (×2): qty 2

## 2019-05-23 MED ORDER — ACETAMINOPHEN 650 MG RE SUPP: 650.0000 mg | Freq: Four times a day (QID) | RECTAL | Status: DC | PRN

## 2019-05-23 MED ORDER — SODIUM CHLORIDE 0.9 % IV SOLN
INTRAVENOUS | Status: AC
  Administered 2019-05-23: 23:00:00 via INTRAVENOUS

## 2019-05-23 MED ORDER — METRONIDAZOLE IN NACL 5-0.79 MG/ML-% IV SOLN
500.0000 mg | Freq: Three times a day (TID) | INTRAVENOUS | Status: DC
  Administered 2019-05-24 – 2019-05-26 (×8): 500 mg via INTRAVENOUS
  Filled 2019-05-23 (×9): qty 100

## 2019-05-23 MED ORDER — ONDANSETRON HCL 4 MG PO TABS: 4.0000 mg | ORAL_TABLET | Freq: Four times a day (QID) | ORAL | Status: DC | PRN

## 2019-05-23 MED ORDER — PANTOPRAZOLE SODIUM 40 MG PO TBEC
40.0000 mg | DELAYED_RELEASE_TABLET | Freq: Every day | ORAL | Status: DC
  Administered 2019-05-24 – 2019-05-27 (×4): 40 mg via ORAL
  Filled 2019-05-23 (×5): qty 1

## 2019-05-23 MED ORDER — ROSUVASTATIN CALCIUM 10 MG PO TABS
20.0000 mg | ORAL_TABLET | Freq: Every day | ORAL | Status: DC
  Administered 2019-05-24 – 2019-05-27 (×4): 20 mg via ORAL
  Filled 2019-05-23 (×4): qty 2

## 2019-05-23 MED ORDER — ACCU-CHEK GUIDE VI STRP: ORAL_STRIP | 12 refills | Status: AC

## 2019-05-23 MED ORDER — ACETAMINOPHEN 325 MG PO TABS: 650.0000 mg | ORAL_TABLET | Freq: Four times a day (QID) | ORAL | Status: DC | PRN

## 2019-05-23 MED ORDER — SUCRALFATE 1 G PO TABS
1.0000 g | ORAL_TABLET | Freq: Three times a day (TID) | ORAL | Status: DC
  Administered 2019-05-24 – 2019-05-27 (×12): 1 g via ORAL
  Filled 2019-05-23 (×13): qty 1

## 2019-05-23 MED ORDER — SODIUM CHLORIDE 0.9% FLUSH: 10.0000 mL | INTRAVENOUS | Status: DC | PRN

## 2019-05-23 NOTE — ED Triage Notes (Signed)
Patient has small cell lung cancer and complaining of fever, generalized weakness, hypotension per his home health nurse, and lower back pain. Symptoms have been going on for about 2 days.

## 2019-05-23 NOTE — Telephone Encounter (Signed)
1) Medication(s) Requested (by name): glucose blood (ACCU-CHEK GUIDE) test strip [[id number]]    2) Pharmacy of Choice: [facility]   3) Special Requests:  glucose monitor     Approved medications will be sent to the pharmacy, we will reach out if there is an issue.  Requests made after 3pm may not be addressed until the following business day!  If a patient is unsure of the name of the medication(s) please note and ask patient to call back when they are able to provide all info, do not send to responsible party until all information is available!

## 2019-05-23 NOTE — Telephone Encounter (Signed)
New Message   Home health nurse is calling, states the pt has a blood pressure of 90/60 and is on 5 blood pressure medications but did not take them today. Also pt does not have a glucometer but he is on metformin 2x a day 1000mg . Please f/u

## 2019-05-23 NOTE — ED Notes (Signed)
Pt arrived to ED with port already accessed.  This RN was able to flush port and get blood return easily.

## 2019-05-23 NOTE — H&P (Addendum)
Andrew West:[id number] DOB: November 27, 1965 DOA: 05/23/2019     PCP: [name], MD   Outpatient Specialists:  CARDS:  Dr. Rennis Golden   Oncology Dr. [name]   Patient arrived to ER on 05/23/19 at 1706  Patient coming from: home Lives  With family  Chief Complaint:  Chief Complaint  Patient presents with  . Cancer Patient  . Generalized Weakness  . Back Pain    HPI: Andrew West is a 53 y.o. male with medical history significant of small cell lung Ca , CAD, DM 2, HTN, OSA not on CPAp, COPD    Presented with  Fever, shortness of breath, hypotensive at home Reports mild cough No chest pain no diarrhea, no abdominal pain  She has chronic back pain for which she takes Percocet he has been having occasional nausea after treatment generalized fatigue and weakness after Neulasta injection. Note per review of records patient have had MRI of the brain showed no metastatic disease to the brain. Has trouble with nutrition and had a PEG tube placed for supplemental nutrition Continues to eat Last chemo 6/22 had neulasta Noted to be febrile up to 101 He noted mild cough but otherwise no diarrhea no chest pain no shortness of breath  Infectious risk factors:  Reports  fever, shortness of breath,   In  ER RAPID COVID TEST NEGATIVE     Regarding pertinent Chronic problems:  Of small cell lung cancer initially was limited stage but showed evidence of metastatic progression to mets to spine and liver and pleural-based metastases since June 2020 3 on chemotherapy with cisplatin and etoposide  Went to radiation Last dose of chemotherapy on 22 June Hyperlipidemia - on statins crestor   HTN on bystolic   CHF diastolic/ - last echo April 2019 preserved EF grade 1 diastolic dysfunction    CAD  - On Aspirin, statin, betablocker,                  -followed by cardiology                - last cardiac cath thousand 18 severe multivessel coronary artery disease was not able to  stent   DM 2 -  Lab Results  Component Value Date   HGBA1C 5.0 03/20/2019  PO meds only,  COPD - not followed by pulmonology    OSA - not on CPAP,   CKD stage III - baseline Cr 1.5   While in ER:  The following Work up has been ordered so far:  Orders Placed This Encounter  Procedures  . Culture, blood (Routine x 2)  . SARS Coronavirus 2 (CEPHEID- Performed in [facility] hospital lab), [facility]  . DG Chest Portable 1 View  . Comprehensive metabolic panel  . Lactic acid, plasma  . CBC with Differential  . Protime-INR  . Urinalysis, Routine w reflex microscopic  . Magnesium  . Phosphorus  . TSH  . Comprehensive metabolic panel  . CBC  . Procalcitonin  . Glucose, capillary  . Diet Carb Modified Fluid consistency: Thin; Room service appropriate? Yes  . Notify Physician if pt is possible Sepsis patient  . Document Actual / Estimated Weight  . Insert / maintain saline lock  . Cardiac monitoring  . STAT CBG when hypoglycemia is suspected. If treated, recheck every 15 minutes after each treatment until CBG >/= 70 mg/dl  . Refer to Hypoglycemia Protocol Sidebar Report for treatment of CBG < 70 mg/dl  .  Vital signs  . Notify physician  . Up with assistance  . If patient diabetic or glucose greater than 140 notify physician for Sliding Scale Insulin Orders  . May go off telemetry for tests/procedures  . Oral care per nursing protocol  . Initiate Oral Care Protocol  . Initiate Carrier Fluid Protocol  . RN may order General Admission PRN Orders utilizing "General Admission PRN medications" (through manage orders) for the following patient needs: allergy symptoms (Claritin), cold sores (Carmex), cough (Robitussin DM), eye irritation (Liquifilm Tears), hemorrhoids (Tucks), indigestion (Maalox), minor skin irritation (Hydrocortisone Cream), muscle pain Romeo Apple Gay), nose irritation (saline nasal spray) and sore throat (Chloraseptic spray).  . If lactate (lactic acid) >2, verify  repeat lactic acid order has been placed.  . Document vital signs within 1-hour of fluid bolus completion and notify provider of bolus completion  . Vital signs  . Refer to Sidebar Report: Sepsis Bundle ED/IP  . No basal insulin at this time  . SCDs  . Patient has an active order for admit to inpatient/place in observation  . Apply neutral pressure cap  . May draw labs from central line  . For first catheter occlusion notify IV team ([facility], [facility]) or provider at all other sites.  . Full code  . Consult to hospitalist  ALL PATIENTS BEING ADMITTED/HAVING PROCEDURES NEED COVID-19 SCREENING  . Pharmacy Consult  . ceFEPime (MAXIPIME) per pharmacy consult  . vancomycin per pharmacy consult  . OT eval and treat  . PT eval and treat  . Pulse oximetry check with vital signs  . Oxygen therapy Mode or (Route): Nasal cannula; Liters Per Minute: 2; Keep 02 saturation: greater than 92 %  . Incentive spirometry  . EKG 12-Lead  . Type and screen [facility]  . Routine line care  . Place in observation (patient's expected length of stay will be less than 2 midnights)    Following Medications were ordered in ER: Medications  HYDROcodone-acetaminophen (NORCO/VICODIN) 5-325 MG per tablet 1 tablet (1 tablet Oral Given 05/23/19 2320)  ranolazine (RANEXA) 12 hr tablet 1,000 mg (1,000 mg Oral Given 05/23/19 2340)  rosuvastatin (CRESTOR) tablet 20 mg (has no administration in time range)  mirtazapine (REMERON) tablet 30 mg (30 mg Oral Given 05/23/19 2319)  pantoprazole (PROTONIX) EC tablet 40 mg (has no administration in time range)  sucralfate (CARAFATE) tablet 1 g (has no administration in time range)  acetaminophen (TYLENOL) tablet 650 mg (has no administration in time range)    Or  acetaminophen (TYLENOL) suppository 650 mg (has no administration in time range)  ondansetron (ZOFRAN) tablet 4 mg (has no administration in time range)    Or  ondansetron (ZOFRAN) injection 4 mg (has no  administration in time range)  insulin aspart (novoLOG) injection 0-9 Units (0 Units Subcutaneous Not Given 05/24/19 0019)  oxyCODONE-acetaminophen (PERCOCET/ROXICET) 5-325 MG per tablet 1 tablet (has no administration in time range)  0.9 %  sodium chloride infusion ( Intravenous New Bag/Given 05/23/19 2328)  metroNIDAZOLE (FLAGYL) IVPB 500 mg (500 mg Intravenous New Bag/Given 05/24/19 0049)  sodium chloride flush (NS) 0.9 % injection 10-40 mL (10 mLs Intracatheter Not Given 05/23/19 2348)  sodium chloride flush (NS) 0.9 % injection 10-40 mL (has no administration in time range)  feeding supplement (ENSURE ENLIVE) (ENSURE ENLIVE) liquid 237 mL (has no administration in time range)  sodium chloride flush (NS) 0.9 % injection 3 mL (3 mLs Intravenous Given 05/23/19 2110)  acetaminophen (TYLENOL) tablet 1,000 mg (1,000 mg  Oral Given 05/23/19 1856)  fentaNYL (SUBLIMAZE) injection 100 mcg (100 mcg Intravenous Given 05/23/19 1853)  vancomycin (VANCOCIN) 1,500 mg in sodium chloride 0.9 % 500 mL IVPB (1,500 mg Intravenous New Bag/Given 05/23/19 2210)  ceFEPIme (MAXIPIME) 2 g in sodium chloride 0.9 % 100 mL IVPB (2 g Intravenous New Bag/Given 05/23/19 2112)  morphine 4 MG/ML injection 4 mg (4 mg Intravenous Given 05/23/19 2110)        Consult Orders  (From admission, onward)         Start     Ordered   05/23/19 2253  PT eval and treat  Routine     05/23/19 2252   05/23/19 2253  OT eval and treat  Routine    Question:  Reason for OT?  Answer:  debility   05/23/19 2252   05/23/19 2046  Consult to hospitalist  Dr. Adela Glimpse  Once    Comments: ALL PATIENTS BEING ADMITTED/HAVING PROCEDURES NEED COVID-19 SCREENING  Provider:  (Not yet assigned)  Question Answer Comment  Place call to: [facility]   Reason for Consult Admit      05/23/19 2045          Significant initial  Findings: Abnormal Labs Reviewed  COMPREHENSIVE METABOLIC PANEL - Abnormal; Notable for the following components:      Result Value    BUN 27 (*)    Creatinine, Ser 1.42 (*)    Calcium 8.4 (*)    Total Protein 6.3 (*)    Albumin 3.1 (*)    GFR calc non Af Amer 56 (*)    All other components within normal limits  CBC WITH DIFFERENTIAL/PLATELET - Abnormal; Notable for the following components:   WBC 0.1 (*)    RBC 2.77 (*)    Hemoglobin 7.9 (*)    HCT 24.2 (*)    Platelets 42 (*)    Lymphs Abs 0.0 (*)    Monocytes Absolute 0.0 (*)    All other components within normal limits  URINALYSIS, ROUTINE W REFLEX MICROSCOPIC - Abnormal; Notable for the following components:   Hgb urine dipstick SMALL (*)    All other components within normal limits    Otherwise labs showing:    Recent Labs  Lab 05/22/19 0922 05/23/19 1900  NA 143 143  K 3.6 3.9  CO2 26 28  GLUCOSE 110* 90  BUN 21* 27*  CREATININE 1.50* 1.42*  CALCIUM 8.3* 8.4*  MG 1.8  --     Cr stable, Lab Results  Component Value Date   CREATININE 1.42 (H) 05/23/2019   CREATININE 1.50 (H) 05/22/2019   CREATININE 1.94 (H) 05/16/2019    Recent Labs  Lab 05/22/19 0922 05/23/19 1900  AST 20 18  ALT 22 21  ALKPHOS 55 45  BILITOT 0.4 0.3  PROT 6.1* 6.3*  ALBUMIN 3.0* 3.1*   Lab Results  Component Value Date   CALCIUM 8.4 (L) 05/23/2019   PHOS 2.5 12/28/2018   WBC     Component Value Date/Time   WBC 0.1 (LL) 05/23/2019 1900   ANC    Component Value Date/Time   NEUTROABS 3.2 05/22/2019 0922   NEUTROABS 3.4 10/10/2017 1504   ALC No results found for: LYMPHOABS    Plt: Lab Results  Component Value Date   PLT 42 (L) 05/23/2019     Lactic Acid, Venous    Component Value Date/Time   LATICACIDVEN 0.8 05/23/2019 2055       COVID-19 Labs  No results for input(s): DDIMER,  FERRITIN, LDH, CRP in the last 72 hours.  Lab Results  Component Value Date   SARSCOV2NAA NEGATIVE 05/23/2019   SARSCOV2NAA NEGATIVE 05/17/2019   SARSCOV2NAA NEGATIVE 05/10/2019      HG/HCT Down    Component Value Date/Time   HGB 7.9 (L) 05/23/2019  1900   HGB 8.6 (L) 05/22/2019 0922   HGB 14.4 10/10/2017 1504   HCT 24.2 (L) 05/23/2019 1900   HCT 39.8 02/25/2018 0231      DM  labs:  HbA1C: Recent Labs    08/21/18 0909 12/19/18 0921 03/20/19 1350  HGBA1C 5.7* 6.2 5.0        UA  ordered   Urine analysis:    Component Value Date/Time   COLORURINE YELLOW 05/23/2019 2055   APPEARANCEUR CLEAR 05/23/2019 2055   LABSPEC 1.014 05/23/2019 2055   PHURINE 5.0 05/23/2019 2055   GLUCOSEU NEGATIVE 05/23/2019 2055   GLUCOSEU NEGATIVE 12/30/2008 1531   HGBUR SMALL (A) 05/23/2019 2055   BILIRUBINUR NEGATIVE 05/23/2019 2055   BILIRUBINUR small 10/10/2017 1455   KETONESUR NEGATIVE 05/23/2019 2055   PROTEINUR NEGATIVE 05/23/2019 2055   UROBILINOGEN 1.0 10/10/2017 1455   UROBILINOGEN 1.0 01/23/2014 2333   NITRITE NEGATIVE 05/23/2019 2055   LEUKOCYTESUR NEGATIVE 05/23/2019 2055        CXR -  NON acute   ECG: not obtained     ED Triage Vitals  Enc Vitals Group     BP 05/23/19 1718 112/78     Pulse Rate 05/23/19 1718 99     Resp 05/23/19 1718 15     Temp 05/23/19 1718 (!) 101.4 F (38.6 C)     Temp Source 05/23/19 1718 Oral     SpO2 05/23/19 1718 95 %     Weight 05/23/19 1718 160 lb (72.6 kg)     Height 05/23/19 1718 5\' 9"  (1.753 m)     Head Circumference --      Peak Flow --      Pain Score 05/23/19 1733 8     Pain Loc --      Pain Edu? --      Excl. in GC? --   TMAX(24)@       Latest  Blood pressure 114/77, pulse 97, temperature (!) 101.4 F (38.6 C), temperature source Oral, resp. rate 20, height 5\' 9"  (1.753 m), weight 72.6 kg, SpO2 99 %.    Hospitalist was called for admission for neutropenic fever   Review of Systems:    Pertinent positives include: Fevers, chills, fatigue, weight loss  non-productive cough Constitutional:  No weight loss, night sweats,  HEENT:  No headaches, Difficulty swallowing,Tooth/dental problems,Sore throat,  No sneezing, itching, ear ache, nasal congestion, post nasal drip,   Cardio-vascular:  No chest pain, Orthopnea, PND, anasarca, dizziness, palpitations.no Bilateral lower extremity swelling  GI:  No heartburn, indigestion, abdominal pain, nausea, vomiting, diarrhea, change in bowel habits, loss of appetite, melena, blood in stool, hematemesis Resp:  no shortness of breath at rest. No dyspnea on exertion, No excess mucus, no productive cough, No , No coughing up of blood.No change in color of mucus.No wheezing. Skin:  no rash or lesions. No jaundice GU:  no dysuria, change in color of urine, no urgency or frequency. No straining to urinate.  No flank pain.  Musculoskeletal:  No joint pain or no joint swelling. No decreased range of motion. No back pain.  Psych:  No change in mood or affect. No depression or anxiety. No memory loss.  Neuro: no  localizing neurological complaints, no tingling, no weakness, no double vision, no gait abnormality, no slurred speech, no confusion  All systems reviewed and apart from HOPI all are negative  Past Medical History:   Past Medical History:  Diagnosis Date  . Anxiety   . CAD (coronary artery disease)    a. s/p multiple PCIs with last cath 11/2016 with severe multivessel CAD, s/p PCTA to LCx but unable to pass stent  . Cancer (HCC)   . Chronic leg pain    bilateral  . Chronic lower back pain   . COPD (chronic obstructive pulmonary disease) (HCC)   . Depression   . GERD (gastroesophageal reflux disease)    Takes Dexilant  . HLD (hyperlipidemia)   . Hypertension   . Rhabdomyolysis    h/o, r/t statins  . Sleep apnea    "can't tolerate mask" (12/16/2016)  . Type II diabetes mellitus (HCC)       Past Surgical History:  Procedure Laterality Date  . BACK SURGERY    . BRONCHIAL BIOPSY  08/25/2018   Procedure: BRONCHIAL BIOPSIES;  Surgeon: Josephine Igo, DO;  Location: [facility];  Service: Cardiopulmonary;;  . CARDIAC CATHETERIZATION N/A 09/25/2015   Procedure: Left Heart Cath and Coronary Angiography;   Surgeon: Marykay Lex, MD;  Location: [facility] INVASIVE CV LAB;  Service: Cardiovascular;  Laterality: N/A;  . CARDIAC CATHETERIZATION N/A 12/16/2016   Procedure: Left Heart Cath and Coronary Angiography;  Surgeon: Marykay Lex, MD;  Location: [facility] INVASIVE CV LAB;  Service: Cardiovascular;  Laterality: N/A;  . CARDIAC CATHETERIZATION N/A 12/16/2016   Procedure: Coronary Balloon Angioplasty;  Surgeon: Marykay Lex, MD;  Location: [facility] INVASIVE CV LAB;  Service: Cardiovascular;  Laterality: N/A;  . COLONOSCOPY W/ POLYPECTOMY    . CORONARY ANGIOPLASTY  09/25/2015   mid cir & om  . CORONARY ANGIOPLASTY WITH STENT PLACEMENT  10/09/2001   PTCA & stenting of mid AV circumflex; 2.5x65mm Pixel stent  . CORONARY ANGIOPLASTY WITH STENT PLACEMENT  12/13/2001   PCI with stent to mid L circumflex, 95% stenosis to 0% residual  . CORONARY ANGIOPLASTY WITH STENT PLACEMENT  10/10/2003   PCI to mid AV circumflex; LAD 30% disease; RCA 100% occluded prox.  . CORONARY ANGIOPLASTY WITH STENT PLACEMENT  09/01/2011   PCI with stenting with bare metal stent to mid AV groove circumflex and PDA  . CORONARY ANGIOPLASTY WITH STENT PLACEMENT  10/17/2011   cutting balloon angioplasty of ostial lateral OM1 branch and bifurcation AV groove circumflex OM junction; stenosis reduced to 0%  . ENDOBRONCHIAL ULTRASOUND Bilateral 08/25/2018   Procedure: ENDOBRONCHIAL ULTRASOUND;  Surgeon: Josephine Igo, DO;  Location: [facility];  Service: Cardiopulmonary;  Laterality: Bilateral;  . ESOPHAGOGASTRODUODENOSCOPY (EGD) WITH PROPOFOL N/A 12/08/2018   Procedure: ESOPHAGOGASTRODUODENOSCOPY (EGD) WITH PROPOFOL;  Surgeon: Rachael Fee, MD;  Location: [facility];  Service: Endoscopy;  Laterality: N/A;  . EXCISIONAL HEMORRHOIDECTOMY    . FINE NEEDLE ASPIRATION  08/25/2018   Procedure: FINE NEEDLE ASPIRATION;  Surgeon: Josephine Igo, DO;  Location: [facility];  Service: Cardiopulmonary;;  . FLEXIBLE BRONCHOSCOPY  08/25/2018    Procedure: FLEXIBLE BRONCHOSCOPY;  Surgeon: Josephine Igo, DO;  Location: [facility];  Service: Cardiopulmonary;;  . IR GASTROSTOMY TUBE MOD SED  12/11/2018  . IR GASTROSTOMY TUBE MOD SED  05/17/2019  . IR GASTROSTOMY TUBE REMOVAL  03/22/2019  . IR IMAGING GUIDED PORT INSERTION  09/29/2018  . LEFT HEART CATHETERIZATION WITH CORONARY ANGIOGRAM N/A 10/18/2011   Procedure:  LEFT HEART CATHETERIZATION WITH CORONARY ANGIOGRAM;  Surgeon: Marykay Lex, MD;  Location: [facility] CATH LAB;  Service: Cardiovascular;  Laterality: N/A;  . LUMBAR LAMINECTOMY/DECOMPRESSION MICRODISCECTOMY  03/31/2012   Procedure: LUMBAR LAMINECTOMY/DECOMPRESSION MICRODISCECTOMY 1 LEVEL;  Surgeon: Temple Pacini, MD;  Location: [facility] NEURO ORS;  Service: Neurosurgery;  Laterality: Left;  . TRANSTHORACIC ECHOCARDIOGRAM  07/28/2011   EF 55-65%; LVH, grade 1 diastolic dysfunction;     Social History:  Ambulatory   Independently      reports that he quit smoking about 4 years ago. His smoking use included cigarettes. He has a 6.25 pack-year smoking history. He has never used smokeless tobacco. He reports that he does not drink alcohol or use drugs.     Family History:   Family History  Problem Relation Age of Onset  . Heart attack Father   . Hypertension Mother   . Diabetes Mother   . Heart disease Brother        x 3   . Heart attack Brother        deceased  . Hypertension Sister   . Diabetes Sister   . Anesthesia problems Neg Hx   . Hypotension Neg Hx   . Malignant hyperthermia Neg Hx   . Pseudochol deficiency Neg Hx     Allergies: Allergies  Allergen Reactions  . Iohexol Anaphylaxis    PT. TO BE PREMEDICATED PRIOR TO IV CONTRAST PER DR Eppie Gibson /MMS//12/15/15Desc: PT BECAME SOB AND CHEST TIGHTNESS AFTER CONTRAST INJECTION.  [name],RT-RCT., Onset Date: [id number]      Prior to Admission medications   Medication Sig Start Date End Date Taking? Authorizing Provider  acetaminophen (TYLENOL) 500 MG tablet Take  1,000 mg by mouth every 6 (six) hours as needed for moderate pain.   Yes [provider]  HYDROcodone-acetaminophen (NORCO/VICODIN) 5-325 MG tablet Take 1 tablet by mouth every 6 (six) hours as needed. 04/02/19  Yes McDonald, Mia A, PA-C  levofloxacin (LEVAQUIN) 500 MG tablet Take 1 tablet (500 mg total) by mouth daily. 05/03/19  Yes Newlin, Odette Horns, MD  LORazepam (ATIVAN) 0.5 MG tablet 1 tab po q 4-6 hours prn or 1 tab po 30 minutes prior to radiation Patient taking differently: Take 0.5 mg by mouth every 6 (six) hours as needed for anxiety or sedation. 1 tab po q 4-6 hours prn or 1 tab po 30 minutes prior to radiation 08/30/18  Yes Ronny Bacon, PA-C  mirtazapine (REMERON) 30 MG tablet Take 1 tablet (30 mg total) by mouth at bedtime. 04/26/19  Yes Newlin, Odette Horns, MD  nebivolol (BYSTOLIC) 5 MG tablet Take 1 tablet (5 mg total) by mouth daily. 03/19/19  Yes Hilty, Lisette Abu, MD  nitroGLYCERIN (NITROSTAT) 0.4 MG SL tablet Place 1 tablet (0.4 mg total) under the tongue every 5 (five) minutes as needed for chest pain. 03/19/19  Yes Hilty, Lisette Abu, MD  oxyCODONE-acetaminophen (PERCOCET/ROXICET) 5-325 MG tablet Take 1 tablet by mouth every 6 (six) hours as needed for severe pain. 05/22/19  Yes Si Gaul, MD  pantoprazole (PROTONIX) 40 MG tablet Take 1 tablet (40 mg total) by mouth daily. 03/20/19  Yes [name], MD  ranolazine (RANEXA) 1000 MG SR tablet Take 1 tablet (1,000 mg total) by mouth 2 (two) times daily. 05/24/17  Yes Hilty, Lisette Abu, MD  rosuvastatin (CRESTOR) 20 MG tablet Take 1 tablet (20 mg total) by mouth daily. 03/20/19  Yes Newlin, Odette Horns, MD  sucralfate (CARAFATE) 1 g tablet Take 1 tablet by mouth 4 (  four) times daily -  before meals and at bedtime. 09/23/18  Yes [provider]  tiotropium (SPIRIVA HANDIHALER) 18 MCG inhalation capsule Place 1 capsule (18 mcg total) into inhaler and inhale daily. 03/20/19  Yes [name], MD  vitamin B-12  (CYANOCOBALAMIN) 500 MCG tablet Take 1 tablet (500 mcg total) by mouth daily. 06/30/18  Yes Marcello Fennel, MD  zolpidem (AMBIEN) 5 MG tablet Take 1 tablet (5 mg total) by mouth at bedtime. 03/20/19  Yes [name], MD  Accu-Chek FastClix Lancets MISC Use as directed to test blood sugar once daily. DX E11.9 05/23/19   [name], MD  acetaminophen-codeine (TYLENOL #3) 300-30 MG tablet Take 1 tablet by mouth every 12 (twelve) hours as needed for moderate pain. Dx; lumbar radiculopathy, lung ca Patient not taking: Reported on 05/23/2019 04/26/19   [name], MD  albuterol (VENTOLIN HFA) 108 (90 Base) MCG/ACT inhaler Inhale 1 puff into the lungs every 6 (six) hours as needed for wheezing or shortness of breath. Patient not taking: Reported on 05/23/2019 03/20/19   [name], MD  aspirin 81 MG tablet Take 1 tablet (81 mg total) daily by mouth. Patient not taking: Reported on 05/23/2019 10/10/17   [name], MD  Blood Glucose Monitoring Suppl (ACCU-CHEK GUIDE) w/Device KIT 1 each by Does not apply route daily. Use as directed to test blood sugar once daily. DX E11.9 05/23/19   [name], MD  cetirizine (ZYRTEC) 10 MG tablet Take 1 tablet (10 mg total) by mouth daily. Patient not taking: Reported on 05/23/2019 04/26/19   [name], MD  Cholecalciferol (VITAMIN D) 2000 units tablet Take 1 tablet (2,000 Units total) by mouth daily. Patient not taking: Reported on 05/23/2019 08/04/18   Marcello Fennel, MD  clopidogrel (PLAVIX) 75 MG tablet Take 1 tablet (75 mg total) by mouth daily. YOU MAY RESTART 08/26/2018 Patient not taking: Reported on 05/23/2019 03/19/19   Chrystie Nose, MD  diclofenac sodium (VOLTAREN) 1 % GEL Apply 2 g topically 4 (four) times daily. Patient not taking: Reported on 05/23/2019 04/20/18   [name], MD  dronabinol (MARINOL) 5 MG capsule Take 1 capsule (5 mg total) by mouth 2 (two) times daily before a meal. Patient not taking: Reported on 05/23/2019 05/08/19    Si Gaul, MD  DULoxetine (CYMBALTA) 60 MG capsule Take 1 capsule (60 mg total) by mouth daily. Patient not taking: Reported on 05/23/2019 03/20/19   [name], MD  Fluticasone-Salmeterol (ADVAIR) 100-50 MCG/DOSE AEPB Inhale 1 puff into the lungs 2 (two) times daily. Patient not taking: Reported on 05/23/2019 03/20/19   [name], MD  glucose blood (ACCU-CHEK GUIDE) test strip Use as directed to test blood sugar once daily. DX E11.9 05/23/19   [name], MD  ondansetron (ZOFRAN ODT) 4 MG disintegrating tablet Take 1 tablet (4 mg total) by mouth every 8 (eight) hours as needed for nausea or vomiting. Patient not taking: Reported on 05/23/2019 02/19/19   Tegeler, Canary Brim, MD  prochlorperazine (COMPAZINE) 10 MG tablet Take 1 tablet (10 mg total) by mouth every 6 (six) hours as needed for nausea or vomiting. Patient not taking: Reported on 05/23/2019 05/07/19   [name], [name], PA-C  promethazine (PHENERGAN) 25 MG suppository Place 1 suppository (25 mg total) rectally every 6 (six) hours as needed for nausea or vomiting. Patient not taking: Reported on 05/23/2019 04/02/19   Barkley Boards, PA-C   Physical Exam: Blood pressure 114/77, pulse 97, temperature (!) 101.4 F (38.6  C), temperature source Oral, resp. rate 20, height 5\' 9"  (1.753 m), weight 72.6 kg, SpO2 99 %. 1. General:  in No  Acute distress    Chronically Ill  Cachectic -appearing 2. Psychological: Alert and  Oriented 3. Head/ENT:   Dry Mucous Membranes                          Head Non traumatic, neck supple                           Poor Dentition 4. SKIN:  decreased Skin turgor,  Skin clean Dry and intact no rash 5. Heart: Regular rate and rhythm no  Murmur, no Rub or gallop 6. Lungs:   no wheezes or crackles   7. Abdomen: Soft, mildly tender, Non distended  bowel sounds present, g tube in place 8. Lower extremities: no clubbing, cyanosis, no edema 9. Neurologically Grossly intact, moving all 4  extremities equally   10. MSK: Normal range of motion   All other LABS:     Recent Labs  Lab 05/22/19 0922 05/23/19 1900  WBC 3.6* 0.1*  NEUTROABS 3.2  --   HGB 8.6* 7.9*  HCT 26.6* 24.2*  MCV 85.3 87.4  PLT 76* 42*     Recent Labs  Lab 05/22/19 0922 05/23/19 1900  NA 143 143  K 3.6 3.9  CL 108 109  CO2 26 28  GLUCOSE 110* 90  BUN 21* 27*  CREATININE 1.50* 1.42*  CALCIUM 8.3* 8.4*  MG 1.8  --      Recent Labs  Lab 05/22/19 0922 05/23/19 1900  AST 20 18  ALT 22 21  ALKPHOS 55 45  BILITOT 0.4 0.3  PROT 6.1* 6.3*  ALBUMIN 3.0* 3.1*      Cultures:    Component Value Date/Time   SDES  04/01/2019 2252    URINE, RANDOM Performed at [facility], [address], [place], [place] [address]    SPECREQUEST  04/01/2019 2252    Immunocompromised Performed at [facility], [address] [place], [place] [address]    CULT (A) 04/01/2019 2252    <10,000 COLONIES/mL INSIGNIFICANT GROWTH Performed at [facility], [address] [place], [place] [address]    REPTSTATUS 04/03/2019 FINAL 04/01/2019 2252     Radiological Exams on Admission: Dg Chest Portable 1 View  Result Date: 05/23/2019 CLINICAL DATA:  Fever and weakness EXAM: PORTABLE CHEST 1 VIEW COMPARISON:  05/03/2019 FINDINGS: Cardiac shadow is stable. Left-sided chest wall port is again noted and stable. The lungs are well aerated without focal infiltrate or sizable effusion. Some scarring is noted in the right upper lobe. No focal infiltrate is seen. No effusion is noted. IMPRESSION: No acute abnormality seen. Electronically Signed   By: Alcide Clever M.D.   On: 05/23/2019 18:54    Chart has been reviewed    Assessment/Plan   53 y.o. male with medical history significant of small cell lung Ca , CAD, DM 2, HTN, OSA not on CPAp, COPD  Admitted for neutropenic fever  Present on Admission: . Neutropenic fever (HCC)-at this point no clear source of infection.   Notify oncology by email patient has been admitted defer to them if would benefit from granulocyte colony-stimulator  . Benign prostatic hyperplasia with urinary hesitancy hold home medications given soft blood pressures for tonight . Bone metastases (HCC)-continue home medications for pain management .  CKD (chronic kidney disease) stage 3, GFR 30-59 ml/min (HCC)-currently at baseline avoid nephrotoxic medications  . COPD with asthma (HCC) currently at baseline no longer smokes . GERD chronic stable . Hereditary and idiopathic peripheral neuropathy resume home medications when able to tolerate . HLD (hyperlipidemia) chronic stable . Hypertension hold home medications for tonight given soft blood pressures . Sleep apnea not on CPAP does not tolerate Diabetes mellitus type 2 -order sliding scale check hemoglobin A1c hold by mouth medications Chronic diastolic CHF currently stable avoid over aggressive fluid overload  Other plan as per orders.  DVT prophylaxis:  SCD        Code Status:  FULL CODE  as per patient  I had personally discussed CODE STATUS with patient    Family Communication:   Family not at  Bedside    Disposition Plan:     To home once workup is complete and patient is stable                       Would benefit from PT/OT eval prior to DC  Ordered                                      Consults called: email oncology   Admission status:  ED Disposition    ED Disposition Condition Comment   Admit  Hospital Area: [facility] [[id number]]  Level of Care: Telemetry [5]  Admit to tele based on following criteria: Other see comments  Comments: sepsis  Covid Evaluation: Confirmed COVID Negative  Diagnosis: Neutropenic fever (HCC) [161096]  Admitting Physician: Therisa Doyne [[id number]]  Attending Physician: Therisa Doyne [[id number]]  PT Class (Do Not Modify): Observation [104]  PT Acc Code (Do Not Modify): Observation [10022]        Obs   Level  of care     tele  For 12H    Precautions:  NONE   No active isolations  PPE: Used by the provider:   P100  eye Goggles,  Gloves    Katleen Carraway 05/24/2019, 12:49 AM     [facility]     after 2 AM please page floor coverage PA If 7AM-7PM, please contact the day team taking care of the patient using [email or web address]

## 2019-05-23 NOTE — Progress Notes (Signed)
A consult was received from an ED physician for Vancomycin & Cefepime per pharmacy dosing.  The patient's profile has been reviewed for ht/wt/allergies/indication/available labs.   A one time order has been placed for Vancomycin 1500mg  & Cefepime 2gm IV.  Further antibiotics/pharmacy consults should be ordered by admitting physician if indicated.                       Thank you, Biagio Borg 05/23/2019  8:52 PM

## 2019-05-23 NOTE — ED Notes (Signed)
Pt stood at bedside to use urinal. Pt had x1 assist.

## 2019-05-23 NOTE — Telephone Encounter (Signed)
  Amy with [facility] is reporting that Mr Andrew West has a blood pressure of 94/60 HR 88 irregular. He has not taken any blood pressure medications today. She would like for the nurse to call that patient to discuss what medications he needs to take and what parameters should he have for checking his blood pressure. Amy has told him not to take anything until she hears from Dr Debara Pickett. She can be reached at [phone]

## 2019-05-23 NOTE — Telephone Encounter (Signed)
This patients diabetic testing supplies are not covered under medicare part D, he needs to [facility] them [facility] so he can use his medicare part B. I will call to find out where he would like the RX's sent.   Pt wants to use [facility] on [address]

## 2019-05-23 NOTE — Telephone Encounter (Signed)
Stop hydralazine, may have to decrease amlodipine to 5 mg daily.  Dr. Lemmie Evens

## 2019-05-23 NOTE — ED Notes (Signed)
Attempted to call report. Phone rang and no answer.

## 2019-05-23 NOTE — ED Provider Notes (Signed)
Emergency Department Provider Note   I have reviewed the triage vital signs and the nursing notes.   HISTORY  Chief Complaint Cancer Patient, Generalized Weakness, and Back Pain   HPI Andrew West is a 53 y.o. male with PMH of COPD, CAD, DM, HTN, and small cell lung cancer presents to the emergency department for evaluation of fever, weakness, low blood pressures at home.  Patient reports known history of metastatic disease to the bones.  He is having lower back pain which is typical for him.  No new weakness or numbness in the legs or arms.  Fatigue and fever symptoms have been ongoing for 2 days.  He saw his home health nurse today who recorded low blood pressures along with fever and referred him to the emergency department.  Patient is on chemotherapy s/p cycle 1 with last dose on 6/22. He was given Neulasta.   Past Medical History:  Diagnosis Date  . Anxiety   . CAD (coronary artery disease)    a. s/p multiple PCIs with last cath 11/2016 with severe multivessel CAD, s/p PCTA to LCx but unable to pass stent  . Cancer ([place])   . Chronic leg pain    bilateral  . Chronic lower back pain   . COPD (chronic obstructive pulmonary disease) ([place])   . Depression   . GERD (gastroesophageal reflux disease)    Takes Dexilant  . HLD (hyperlipidemia)   . Hypertension   . Rhabdomyolysis    h/o, r/t statins  . Sleep apnea    "can't tolerate mask" (12/16/2016)  . Type II diabetes mellitus [facility])     Patient Active Problem List   Diagnosis Date Noted  . Neutropenic fever ([place]) 05/23/2019  . Bone metastases ([place]) 05/10/2019  . Pain from bone metastases ([place]) 05/10/2019  . Port-A-Cath in place 05/07/2019  . Protein-calorie malnutrition, severe 12/26/2018  . Symptomatic anemia 12/25/2018  . Syncope 12/25/2018  . CKD (chronic kidney disease) stage 3, GFR 30-59 ml/min (HCC) 12/09/2018  . Radiation esophagitis   . Malnutrition of moderate degree 12/06/2018  . Esophageal stricture   .  N&V (nausea and vomiting) 12/01/2018  . Antineoplastic chemotherapy induced pancytopenia ([place]) 12/01/2018  . Odynophagia   . Dysphagia 10/08/2018  . Hypokalemia 10/08/2018  . Hypomagnesemia 10/08/2018  . Type 2 diabetes mellitus ([place]) 10/08/2018  . Anemia 10/08/2018  . Sleep apnea 10/08/2018  . 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[place]) 08/31/2018  . Goals of care, counseling/discussion 08/31/2018  . Encounter for antineoplastic chemotherapy 08/31/2018  . Encounter for smoking cessation counseling 08/31/2018  . Malignant neoplasm of bronchus of right upper lobe ([place]) 08/29/2018  . Mediastinal mass 08/17/2018  . Vitamin D deficiency 08/04/2018  . Diabetic peripheral neuropathy ([place]) 06/30/2018  . Substernal chest pain 02/25/2018  . Benign prostatic hyperplasia with urinary hesitancy 11/17/2017  . Preoperative cardiovascular examination 07/12/2017  . Refractory angina ([place]) 05/24/2017  . Complex regional pain syndrome type I 03/24/2017  . Progressive angina ([place]) -Class III 12/16/2016  . Anxiety 06/28/2016  . Diastolic dysfunction-grade 2 with EF 60-65% Oct 2016 03/22/2016  . Hypertension 10/03/2015  . CAD S/P multiple PCI's 10/03/2015  . Pulmonary nodule 06/24/2015  . Cough 06/24/2015  . COPD with asthma ([place]) 06/24/2015  . Reactive airway disease 06/04/2015  . DOE (dyspnea on exertion) 04/15/2015  . Chronic low back pain 02/25/2014  . Lumbar disc herniation with radiculopathy 03/31/2012  . Presence of stent in left circumflex coronary artery 10/18/2011  Class: History of  . TOBACCO ABUSE 02/27/2009  . ABDOMINAL PAIN, LEFT LOWER QUADRANT 12/30/2008  . ABDOMINAL PAIN, EPIGASTRIC 12/05/2008  . Anxiety and depression 09/05/2008  . Hereditary and idiopathic peripheral neuropathy 09/05/2008  . GERD 09/05/2008  . Cervical disc disorder with radiculopathy of cervical region 08/19/2008  . HLD (hyperlipidemia) 04/25/2008    Class: Diagnosis of  . ALLERGIC RHINITIS  04/25/2008  . Backache 04/25/2008  . Chest pain, unspecified 04/25/2008  . COLONIC POLYPS, HX OF 04/25/2008  . Obesity 04/18/2008    Class: Diagnosis of  . ANAL FISSURE, HX OF 04/18/2008  . Diabetes mellitus ([place]) 01/30/2008    Class: History of  . RECTAL BLEEDING 01/30/2008    Past Surgical History:  Procedure Laterality Date  . BACK SURGERY    . BRONCHIAL BIOPSY  08/25/2018   Procedure: BRONCHIAL BIOPSIES;  Surgeon: Garner Nash, DO;  Location: [facility];  Service: Cardiopulmonary;;  . CARDIAC CATHETERIZATION N/A 09/25/2015   Procedure: Left Heart Cath and Coronary Angiography;  Surgeon: Leonie Man, MD;  Location: [place] [facility];  Service: Cardiovascular;  Laterality: N/A;  . CARDIAC CATHETERIZATION N/A 12/16/2016   Procedure: Left Heart Cath and Coronary Angiography;  Surgeon: Leonie Man, MD;  Location: [place] [facility];  Service: Cardiovascular;  Laterality: N/A;  . CARDIAC CATHETERIZATION N/A 12/16/2016   Procedure: Coronary Balloon Angioplasty;  Surgeon: Leonie Man, MD;  Location: [place] [facility];  Service: Cardiovascular;  Laterality: N/A;  . COLONOSCOPY W/ POLYPECTOMY    . CORONARY ANGIOPLASTY  09/25/2015   mid cir & om  . CORONARY ANGIOPLASTY WITH STENT PLACEMENT  10/09/2001   PTCA & stenting of mid AV circumflex; 2.5x16m Pixel stent  . CORONARY ANGIOPLASTY WITH STENT PLACEMENT  12/13/2001   PCI with stent to mid L circumflex, 95% stenosis to 0% residual  . CORONARY ANGIOPLASTY WITH STENT PLACEMENT  10/10/2003   PCI to mid AV circumflex; LAD 30% disease; RCA 100% occluded prox.  . CORONARY ANGIOPLASTY WITH STENT PLACEMENT  09/01/2011   PCI with stenting with bare metal stent to mid AV groove circumflex and PDA  . CORONARY ANGIOPLASTY WITH STENT PLACEMENT  10/17/2011   cutting balloon angioplasty of ostial lateral OM1 branch and bifurcation AV groove circumflex OM junction; stenosis reduced to 0%  . ENDOBRONCHIAL ULTRASOUND Bilateral 08/25/2018    Procedure: ENDOBRONCHIAL ULTRASOUND;  Surgeon: IGarner Nash DO;  Location: [facility];  Service: Cardiopulmonary;  Laterality: Bilateral;  . ESOPHAGOGASTRODUODENOSCOPY (EGD) WITH PROPOFOL N/A 12/08/2018   Procedure: ESOPHAGOGASTRODUODENOSCOPY (EGD) WITH PROPOFOL;  Surgeon: JMilus Banister MD;  Location: [facility];  Service: Endoscopy;  Laterality: N/A;  . EXCISIONAL HEMORRHOIDECTOMY    . FINE NEEDLE ASPIRATION  08/25/2018   Procedure: FINE NEEDLE ASPIRATION;  Surgeon: IGarner Nash DO;  Location: [facility];  Service: Cardiopulmonary;;  . FLEXIBLE BRONCHOSCOPY  08/25/2018   Procedure: FLEXIBLE BRONCHOSCOPY;  Surgeon: IGarner Nash DO;  Location: [facility];  Service: Cardiopulmonary;;  . IR GASTROSTOMY TUBE MOD SED  12/11/2018  . IR GASTROSTOMY TUBE MOD SED  05/17/2019  . IR GASTROSTOMY TUBE REMOVAL  03/22/2019  . IR IMAGING GUIDED PORT INSERTION  09/29/2018  . LEFT HEART CATHETERIZATION WITH CORONARY ANGIOGRAM N/A 10/18/2011   Procedure: LEFT HEART CATHETERIZATION WITH CORONARY ANGIOGRAM;  Surgeon: DLeonie Man MD;  [facility];  Service: Cardiovascular;  Laterality: N/A;  . LUMBAR LAMINECTOMY/DECOMPRESSION MICRODISCECTOMY  03/31/2012   Procedure: LUMBAR LAMINECTOMY/DECOMPRESSION MICRODISCECTOMY 1 LEVEL;  Surgeon: HMallie Mussel  A Pool, MD;  Location: [place] [facility];  Service: Neurosurgery;  Laterality: Left;  . TRANSTHORACIC ECHOCARDIOGRAM  07/28/2011   EF 55-65%; LVH, grade 1 diastolic dysfunction;     Allergies Iohexol  Family History  Problem Relation Age of Onset  . Heart attack Father   . Hypertension Mother   . Diabetes Mother   . Heart disease Brother        x 3   . Heart attack Brother        deceased  . Hypertension Sister   . Diabetes Sister   . Anesthesia problems Neg Hx   . Hypotension Neg Hx   . Malignant hyperthermia Neg Hx   . Pseudochol deficiency Neg Hx     Social History Social History   Tobacco Use  . Smoking status: Former Smoker     Packs/day: 0.25    Years: 25.00    Pack years: 6.25    Types: Cigarettes    Quit date: 05/24/2015    Years since quitting: 4.0  . Smokeless tobacco: Never Used  Substance Use Topics  . Alcohol use: No    Alcohol/week: 0.0 standard drinks  . Drug use: No    Review of Systems  Constitutional: Positive fever/chills and fatigue.  Eyes: No visual changes. ENT: No sore throat. Cardiovascular: Denies chest pain. Respiratory: Denies shortness of breath. Gastrointestinal: No abdominal pain.  No nausea, no vomiting.  No diarrhea.  No constipation. Genitourinary: Negative for dysuria. Musculoskeletal: Chronic back pain.  Skin: Negative for rash. Neurological: Negative for headaches, focal weakness or numbness.  10-point ROS otherwise negative.  ____________________________________________   PHYSICAL EXAM:  VITAL SIGNS: ED Triage Vitals  Enc Vitals Group     BP 05/23/19 1718 112/78     Pulse Rate 05/23/19 1718 99     Resp 05/23/19 1718 15     Temp 05/23/19 1718 (!) 101.4 F (38.6 C)     Temp Source 05/23/19 1718 Oral     SpO2 05/23/19 1718 95 %     Weight 05/23/19 1718 160 lb (72.6 kg)     Height 05/23/19 1718 _0  (1.753 m)   Constitutional: Alert and oriented. Well appearing and in no acute distress. Eyes: Conjunctivae are normal.  Head: Atraumatic. Nose: No congestion/rhinnorhea. Mouth/Throat: Mucous membranes are moist.  Neck: No stridor.  Cardiovascular: Normal rate, regular rhythm. Good peripheral circulation. Grossly normal heart sounds.   Respiratory: Normal respiratory effort.  No retractions. Lungs CTAB. Gastrointestinal: Soft and nontender. No distention.  Musculoskeletal: No lower extremity tenderness nor edema. No gross deformities of extremities. Neurologic:  Normal speech and language. No gross focal neurologic deficits are appreciated.  Skin:  Skin is warm, dry and intact. No rash noted.  ____________________________________________   LABS (all labs  ordered are listed, but only abnormal results are displayed)  Labs Reviewed  COMPREHENSIVE METABOLIC PANEL - Abnormal; Notable for the following components:      Result Value   BUN 27 (*)    Creatinine, Ser 1.42 (*)    Calcium 8.4 (*)    Total Protein 6.3 (*)    Albumin 3.1 (*)    GFR calc non Af Amer 56 (*)    All other components within normal limits  CBC WITH DIFFERENTIAL/PLATELET - Abnormal; Notable for the following components:   WBC 0.1 (*)    RBC 2.77 (*)    Hemoglobin 7.9 (*)    HCT 24.2 (*)    Platelets 42 (*)    Lymphs  Abs 0.0 (*)    Monocytes Absolute 0.0 (*)    All other components within normal limits  URINALYSIS, ROUTINE W REFLEX MICROSCOPIC - Abnormal; Notable for the following components:   Hgb urine dipstick SMALL (*)    All other components within normal limits  COMPREHENSIVE METABOLIC PANEL - Abnormal; Notable for the following components:   BUN 26 (*)    Creatinine, Ser 1.33 (*)    Calcium 8.0 (*)    Total Protein 5.7 (*)    Albumin 2.7 (*)    All other components within normal limits  CBC - Abnormal; Notable for the following components:   WBC 0.1 (*)    RBC 2.57 (*)    Hemoglobin 7.1 (*)    HCT 22.6 (*)    Platelets 31 (*)    All other components within normal limits  GLUCOSE, CAPILLARY - Abnormal; Notable for the following components:   Glucose-Capillary 66 (*)    All other components within normal limits  CULTURE, BLOOD (ROUTINE X 2)  CULTURE, BLOOD (ROUTINE X 2)  SARS CORONAVIRUS 2 (HOSPITAL ORDER, [place])  LACTIC ACID, PLASMA  PROTIME-INR  MAGNESIUM  PHOSPHORUS  TSH  PROCALCITONIN  GLUCOSE, CAPILLARY  GLUCOSE, CAPILLARY  GLUCOSE, CAPILLARY  TYPE AND SCREEN   ____________________________________________  RADIOLOGY  Dg Chest Portable 1 View  Result Date: 05/23/2019 CLINICAL DATA:  Fever and weakness EXAM: PORTABLE CHEST 1 VIEW COMPARISON:  05/03/2019 FINDINGS: Cardiac shadow is stable. Left-sided chest  wall port is again noted and stable. The lungs are well aerated without focal infiltrate or sizable effusion. Some scarring is noted in the right upper lobe. No focal infiltrate is seen. No effusion is noted. IMPRESSION: No acute abnormality seen. Electronically Signed   By: Inez Catalina M.D.   On: 05/23/2019 18:54    ____________________________________________   PROCEDURES  Procedure(s) performed:   Procedures  CRITICAL CARE Performed by: Margette Fast Total critical care time: 35 minutes Critical care time was exclusive of separately billable procedures and treating other patients. Critical care was necessary to treat or prevent imminent or life-threatening deterioration. Critical care was time spent personally by me on the following activities: development of treatment plan with patient and/or surrogate as well as nursing, discussions with consultants, evaluation of patient's response to treatment, examination of patient, obtaining history from patient or surrogate, ordering and performing treatments and interventions, ordering and review of laboratory studies, ordering and review of radiographic studies, pulse oximetry and re-evaluation of patient's condition.  Nanda Quinton, MD Emergency Medicine  ____________________________________________   INITIAL IMPRESSION / ASSESSMENT AND PLAN / ED COURSE  Pertinent labs & imaging results that were available during my care of the patient were reviewed by me and considered in my medical decision making (see chart for details).   Patient presents to the emergency department with fever, low blood pressure in the setting of chemotherapy.  Last treatment on 6/22.  Followed here for small cell lung cancer.  Patient awake and alert.  He is on O2 requirement now which is new.  COVID-19 is a consideration.  Plan for sepsis w/u and reassess.   COVID negative. Patient is neutropenic on labs. Covering with Cefepime and Vancomycin. CXR reviewed.    Discussed patient's case with Hospitalist, Dr. Roel Cluck to request admission. Patient and family (if present) updated with plan. Care transferred to Hospitalist service.  I reviewed all nursing notes, vitals, pertinent old records, EKGs, labs, imaging (as available).  ____________________________________________  FINAL  CLINICAL IMPRESSION(S) / ED DIAGNOSES  Final diagnoses:  Neutropenic fever ([place])     MEDICATIONS GIVEN DURING THIS VISIT:  Medications  ranolazine (RANEXA) 12 hr tablet 1,000 mg (1,000 mg Oral Given 05/24/19 0946)  rosuvastatin (CRESTOR) tablet 20 mg (20 mg Oral Given 05/24/19 0949)  mirtazapine (REMERON) tablet 30 mg (30 mg Oral Given 05/23/19 2319)  pantoprazole (PROTONIX) EC tablet 40 mg (40 mg Oral Given 05/24/19 0949)  sucralfate (CARAFATE) tablet 1 g (1 g Oral Given 05/24/19 0946)  acetaminophen (TYLENOL) tablet 650 mg (has no administration in time range)    Or  acetaminophen (TYLENOL) suppository 650 mg (has no administration in time range)  ondansetron (ZOFRAN) tablet 4 mg (has no administration in time range)    Or  ondansetron (ZOFRAN) injection 4 mg (has no administration in time range)  oxyCODONE-acetaminophen (PERCOCET/ROXICET) 5-325 MG per tablet 1 tablet (has no administration in time range)  0.9 %  sodium chloride infusion ( Intravenous Rate/Dose Verify 05/24/19 0600)  metroNIDAZOLE (FLAGYL) IVPB 500 mg (500 mg Intravenous New Bag/Given 05/24/19 0948)  sodium chloride flush (NS) 0.9 % injection 10-40 mL (10 mLs Intracatheter Given 05/24/19 0957)  sodium chloride flush (NS) 0.9 % injection 10-40 mL (has no administration in time range)  feeding supplement (ENSURE ENLIVE) (ENSURE ENLIVE) liquid 237 mL (237 mLs Oral Given 05/24/19 0950)  vancomycin (VANCOCIN) 1,500 mg in sodium chloride 0.9 % 500 mL IVPB (has no administration in time range)  ceFEPIme (MAXIPIME) 2 g in sodium chloride 0.9 % 100 mL IVPB ( Intravenous Stopped 05/24/19 0542)  morphine 2 MG/ML injection 2 mg  (2 mg Intravenous Given 05/24/19 1002)  insulin aspart (novoLOG) injection 0-9 Units (has no administration in time range)  nebivolol (BYSTOLIC) tablet 5 mg (has no administration in time range)  sodium chloride flush (NS) 0.9 % injection 3 mL (3 mLs Intravenous Given 05/23/19 2110)  acetaminophen (TYLENOL) tablet 1,000 mg (1,000 mg Oral Given 05/23/19 1856)  fentaNYL (SUBLIMAZE) injection 100 mcg (100 mcg Intravenous Given 05/23/19 1853)  vancomycin (VANCOCIN) 1,500 mg in sodium chloride 0.9 % 500 mL IVPB ( Intravenous Stopped 05/24/19 0020)  ceFEPIme (MAXIPIME) 2 g in sodium chloride 0.9 % 100 mL IVPB (2 g Intravenous New Bag/Given 05/23/19 2112)  morphine 4 MG/ML injection 4 mg (4 mg Intravenous Given 05/23/19 2110)     Note:  This document was prepared using Dragon voice recognition software and may include unintentional dictation errors.  Nanda Quinton, MD Emergency Medicine    , [facility], MD 05/24/19 1124

## 2019-05-23 NOTE — Telephone Encounter (Signed)
Spoke to [employer] given  . Verbalized understanding.  she states she heard back from primary - they recommend for patient to go to Er. She states she is waiting on oncologist.

## 2019-05-23 NOTE — ED Notes (Signed)
ED TO INPATIENT HANDOFF REPORT  ED Nurse Name and Phone #: Judye Bos Name/Age/Gender Andrew West 53 y.o. male Room/Bed: WA20/WA20  Code Status   Code Status: Prior  Home/SNF/Other Home AOx4 Is this baseline? Yes   Triage Complete: Triage complete  Chief Complaint Cancer pt/ Generalized Bodyaches  Triage Note Patient has small cell lung cancer and complaining of fever, generalized weakness, hypotension per his home health nurse, and lower back pain. Symptoms have been going on for about 2 days.    Allergies Allergies  Allergen Reactions  . Iohexol Anaphylaxis    PT. TO BE PREMEDICATED PRIOR TO IV CONTRAST PER DR Kris Hartmann /MMS//12/15/15Desc: PT BECAME SOB AND CHEST TIGHTNESS AFTER CONTRAST INJECTION.  [name],RT-RCT., Onset Date: [id number]     Level of Care/Admitting Diagnosis ED Disposition    ED Disposition Condition Comment   Admit  Hospital Area: [place] [[id number]]  Level of Care: Telemetry [5]  Admit to tele based on following criteria: Other see comments  Comments: sepsis  Covid Evaluation: Confirmed COVID Negative  Diagnosis: Neutropenic fever ([place]) [518841]  Admitting Physician: Toy Baker [[id number]]  Attending Physician: Toy Baker [[id number]]  PT Class (Do Not Modify): Observation [104]  PT Acc Code (Do Not Modify): Observation [10022]       B Medical/Surgery History Past Medical History:  Diagnosis Date  . Anxiety   . CAD (coronary artery disease)    a. s/p multiple PCIs with last cath 11/2016 with severe multivessel CAD, s/p PCTA to LCx but unable to pass stent  . Cancer ([place])   . Chronic leg pain    bilateral  . Chronic lower back pain   . COPD (chronic obstructive pulmonary disease) ([place])   . Depression   . GERD (gastroesophageal reflux disease)    Takes Dexilant  . HLD (hyperlipidemia)   . Hypertension   . Rhabdomyolysis    h/o, r/t statins  . Sleep apnea    "can't tolerate mask" (12/16/2016)  .  Type II diabetes mellitus ([place])    Past Surgical History:  Procedure Laterality Date  . BACK SURGERY    . BRONCHIAL BIOPSY  08/25/2018   Procedure: BRONCHIAL BIOPSIES;  Surgeon: Garner Nash, DO;  [facility]: [facility];  Service: Cardiopulmonary;;  . CARDIAC CATHETERIZATION N/A 09/25/2015   Procedure: Left Heart Cath and Coronary Angiography;  Surgeon: Leonie Man, MD;  Location: [place] [facility];  Service: Cardiovascular;  Laterality: N/A;  . CARDIAC CATHETERIZATION N/A 12/16/2016   Procedure: Left Heart Cath and Coronary Angiography;  Surgeon: Leonie Man, MD;  Location: [place];  Service: Cardiovascular;  Laterality: N/A;  . CARDIAC CATHETERIZATION N/A 12/16/2016   Procedure: Coronary Balloon Angioplasty;  Surgeon: Leonie Man, MD;  Location: [place] [facility];  Service: Cardiovascular;  Laterality: N/A;  . COLONOSCOPY W/ POLYPECTOMY    . CORONARY ANGIOPLASTY  09/25/2015   mid cir & om  . CORONARY ANGIOPLASTY WITH STENT PLACEMENT  10/09/2001   PTCA & stenting of mid AV circumflex; 2.5x75m Pixel stent  . CORONARY ANGIOPLASTY WITH STENT PLACEMENT  12/13/2001   PCI with stent to mid L circumflex, 95% stenosis to 0% residual  . CORONARY ANGIOPLASTY WITH STENT PLACEMENT  10/10/2003   PCI to mid AV circumflex; LAD 30% disease; RCA 100% occluded prox.  . CORONARY ANGIOPLASTY WITH STENT PLACEMENT  09/01/2011   PCI with stenting with bare metal stent to mid AV groove circumflex and PDA  . CORONARY ANGIOPLASTY  WITH STENT PLACEMENT  10/17/2011   cutting balloon angioplasty of ostial lateral OM1 branch and bifurcation AV groove circumflex OM junction; stenosis reduced to 0%  . ENDOBRONCHIAL ULTRASOUND Bilateral 08/25/2018   Procedure: ENDOBRONCHIAL ULTRASOUND;  Surgeon: Garner Nash, DO;  [facility];  Service: Cardiopulmonary;  Laterality: Bilateral;  . ESOPHAGOGASTRODUODENOSCOPY (EGD) WITH PROPOFOL N/A 12/08/2018   Procedure: ESOPHAGOGASTRODUODENOSCOPY  (EGD) WITH PROPOFOL;  Surgeon: Milus Banister, MD;  [facility];  Service: Endoscopy;  Laterality: N/A;  . EXCISIONAL HEMORRHOIDECTOMY    . FINE NEEDLE ASPIRATION  08/25/2018   Procedure: FINE NEEDLE ASPIRATION;  Surgeon: Garner Nash, DO;  [facility];  Service: Cardiopulmonary;;  . FLEXIBLE BRONCHOSCOPY  08/25/2018   Procedure: FLEXIBLE BRONCHOSCOPY;  Surgeon: Garner Nash, DO;  [facility];  Service: Cardiopulmonary;;  . IR GASTROSTOMY TUBE MOD SED  12/11/2018  . IR GASTROSTOMY TUBE MOD SED  05/17/2019  . IR GASTROSTOMY TUBE REMOVAL  03/22/2019  . IR IMAGING GUIDED PORT INSERTION  09/29/2018  . LEFT HEART CATHETERIZATION WITH CORONARY ANGIOGRAM N/A 10/18/2011   Procedure: LEFT HEART CATHETERIZATION WITH CORONARY ANGIOGRAM;  Surgeon: Leonie Man, MD;  Location: [facility] CATH LAB;  Service: Cardiovascular;  Laterality: N/A;  . LUMBAR LAMINECTOMY/DECOMPRESSION MICRODISCECTOMY  03/31/2012   Procedure: LUMBAR LAMINECTOMY/DECOMPRESSION MICRODISCECTOMY 1 LEVEL;  Surgeon: Charlie Pitter, MD;  Location: [facility] OR[facility];  Service: Neurosurgery;  Laterality: Left;  . TRANSTHORACIC ECHOCARDIOGRAM  07/28/2011   EF 55-65%; LVH, grade 1 diastolic dysfunction;      A IV Location/Drains/Wounds Patient Lines/Drains/Airways Status   Active Line/Drains/Airways    Name:   Placement date:   Placement time:   Site:   Days:   Implanted Port 09/29/18 Left Chest   09/29/18    1527    Chest   236   Gastrostomy/Enterostomy Gastrostomy 24 Fr. LUQ   05/17/19    1326    LUQ   6          Intake/Output Last 24 hours  Intake/Output Summary (Last 24 hours) at 05/23/2019 2226 Last data filed at 05/23/2019 2110 Gross per 24 hour  Intake 3 ml  Output -  Net 3 ml    Labs/Imaging Results for orders placed or performed during the hospital encounter of 05/23/19 (from the past 48 hour(s))  SARS Coronavirus 2 (CEPHEID- Performed in [place] hospital lab), Hosp Order     Status: None    Collection Time: 05/23/19  6:59 PM   Specimen: Nasopharyngeal Swab  Result Value Ref Range   SARS Coronavirus 2 NEGATIVE NEGATIVE    Comment: (NOTE) If result is NEGATIVE SARS-CoV-2 target nucleic acids are NOT DETECTED. The SARS-CoV-2 RNA is generally detectable in upper and lower  respiratory specimens during the acute phase of infection. The lowest  concentration of SARS-CoV-2 viral copies this assay can detect is 250  copies / mL. A negative result does not preclude SARS-CoV-2 infection  and should not be used as the sole basis for treatment or other  patient management decisions.  A negative result may occur with  improper specimen collection / handling, submission of specimen other  than nasopharyngeal swab, presence of viral mutation(s) within the  areas targeted by this assay, and inadequate number of viral copies  (<250 copies / mL). A negative result must be combined with clinical  observations, patient history, and epidemiological information. If result is POSITIVE SARS-CoV-2 target nucleic acids are DETECTED. The SARS-CoV-2 RNA is generally detectable in upper and  lower  respiratory specimens dur ing the acute phase of infection.  Positive  results are indicative of active infection with SARS-CoV-2.  Clinical  correlation with patient history and other diagnostic information is  necessary to determine patient infection status.  Positive results do  not rule out bacterial infection or co-infection with other viruses. If result is PRESUMPTIVE POSTIVE SARS-CoV-2 nucleic acids MAY BE PRESENT.   A presumptive positive result was obtained on the submitted specimen  and confirmed on repeat testing.  While 2019 novel coronavirus  (SARS-CoV-2) nucleic acids may be present in the submitted sample  additional confirmatory testing may be necessary for epidemiological  and / or clinical management purposes  to differentiate between  SARS-CoV-2 and other Sarbecovirus currently known  to infect humans.  If clinically indicated additional testing with an alternate test  methodology [phone]) is advised. The SARS-CoV-2 RNA is generally  detectable in upper and lower respiratory sp ecimens during the acute  phase of infection. The expected result is Negative. Fact Sheet for Patients:  StrictlyIdeas.no Fact Sheet for Healthcare Providers: BankingDealers.co.za This test is not yet approved or cleared by the [place] FDA and has been authorized for detection and/or diagnosis of SARS-CoV-2 by FDA under an Emergency Use Authorization (EUA).  This EUA will remain in effect (meaning this test can be used) for the duration of the COVID-19 declaration under Section 564(b)(1) of the Act, 21 U.S.C. section 360bbb-3(b)(1), unless the authorization is terminated or revoked sooner. Performed at [facility], [place] [address], [place], [place] [address]   Comprehensive metabolic panel     Status: Abnormal   Collection Time: 05/23/19  7:00 PM  Result Value Ref Range   Sodium 143 135 - 145 mmol/L   Potassium 3.9 3.5 - 5.1 mmol/L   Chloride 109 98 - 111 mmol/L   CO2 28 22 - 32 mmol/L   Glucose, Bld 90 70 - 99 mg/dL   BUN 27 (H) 6 - 20 mg/dL   Creatinine, Ser 1.42 (H) 0.61 - 1.24 mg/dL   Calcium 8.4 (L) 8.9 - 10.3 mg/dL   Total Protein 6.3 (L) 6.5 - 8.1 g/dL   Albumin 3.1 (L) 3.5 - 5.0 g/dL   AST 18 15 - 41 U/L   ALT 21 0 - 44 U/L   Alkaline Phosphatase 45 38 - 126 U/L   Total Bilirubin 0.3 0.3 - 1.2 mg/dL   GFR calc non Af Amer 56 (L) >60 mL/min   GFR calc Af Amer >60 >60 mL/min   Anion gap 6 5 - 15    Comment: Performed at [facility], [place] [address] [place], [place] [address]  CBC with Differential     Status: Abnormal   Collection Time: 05/23/19  7:00 PM  Result Value Ref Range   WBC 0.1 (LL) 4.0 - 10.5 K/uL    Comment: REPEATED TO VERIFY THIS CRITICAL RESULT HAS VERIFIED AND BEEN  CALLED TO [facility],Q BY KENNEDY JACKSON ON 07 01 2020 AT 2002, AND HAS BEEN READ BACK. CRITICAL RESULT VERIFIED    RBC 2.77 (L) 4.22 - 5.81 MIL/uL   Hemoglobin 7.9 (L) 13.0 - 17.0 g/dL   HCT 24.2 (L) 39.0 - 52.0 %   MCV 87.4 80.0 - 100.0 fL   MCH 28.5 26.0 - 34.0 pg   MCHC 32.6 30.0 - 36.0 g/dL   RDW 13.4 11.5 - 15.5 %   Platelets 42 (L) 150 - 400 K/uL    Comment: REPEATED TO  VERIFY PLATELET COUNT CONFIRMED BY SMEAR SPECIMEN CHECKED FOR CLOTS Immature Platelet Fraction may be clinically indicated, consider ordering this additional test [id number]    nRBC 0.0 0.0 - 0.2 %   Neutrophils Relative % 0 %   Lymphocytes Relative 0 %   Monocytes Relative 0 %   Eosinophils Relative 0 %   Basophils Relative 0 %   Band Neutrophils 0 %   nRBC 0 0 /100 WBC   Lymphs Abs 0.0 (L) 0.7 - 4.0 K/uL   Monocytes Absolute 0.0 (L) 0.1 - 1.0 K/uL   Eosinophils Absolute 0.0 0.0 - 0.5 K/uL   Basophils Absolute 0.0 0.0 - 0.1 K/uL   RBC Morphology TARGET CELLS     Comment: ELLIPTOCYTES   WBC Morphology TOO FEW TO COUNT, SMEAR AVAILABLE FOR REVIEW     Comment: RARE LYMPH SEEN Performed at [facility], [place] [address] [place], [place] [address]   Protime-INR     Status: None   Collection Time: 05/23/19  7:00 PM  Result Value Ref Range   Prothrombin Time 13.9 11.4 - 15.2 seconds   INR 1.1 0.8 - 1.2    Comment: (NOTE) INR goal varies based on device and disease states. Performed at [facility], [place] [address], [place], [place] [address]   Lactic acid, plasma     Status: None   Collection Time: 05/23/19  8:55 PM  Result Value Ref Range   Lactic Acid, Venous 0.8 0.5 - 1.9 mmol/L    Comment: Performed at [facility], [place] [address], [place], [place] [address]  Urinalysis, Routine w reflex microscopic     Status: Abnormal   Collection Time: 05/23/19  8:55 PM  Result Value Ref Range   Color, Urine YELLOW YELLOW   APPearance CLEAR CLEAR    Specific Gravity, Urine 1.014 1.005 - 1.030   pH 5.0 5.0 - 8.0   Glucose, UA NEGATIVE NEGATIVE mg/dL   Hgb urine dipstick SMALL (A) NEGATIVE   Bilirubin Urine NEGATIVE NEGATIVE   Ketones, ur NEGATIVE NEGATIVE mg/dL   Protein, ur NEGATIVE NEGATIVE mg/dL   Nitrite NEGATIVE NEGATIVE   Leukocytes,Ua NEGATIVE NEGATIVE   RBC / HPF 0-5 0 - 5 RBC/hpf   WBC, UA 0-5 0 - 5 WBC/hpf   Bacteria, UA NONE SEEN NONE SEEN   Mucus PRESENT     Comment: Performed at [facility], [place] [address], [place], [place] [address]   Dg Chest Portable 1 View  Result Date: 05/23/2019 CLINICAL DATA:  Fever and weakness EXAM: PORTABLE CHEST 1 VIEW COMPARISON:  05/03/2019 FINDINGS: Cardiac shadow is stable. Left-sided chest wall port is again noted and stable. The lungs are well aerated without focal infiltrate or sizable effusion. Some scarring is noted in the right upper lobe. No focal infiltrate is seen. No effusion is noted. IMPRESSION: No acute abnormality seen. Electronically Signed   By: Inez Catalina M.D.   On: 05/23/2019 18:54    Pending Labs Unresulted Labs (From admission, onward)    Start     Ordered   05/23/19 1742  Lactic acid, plasma  Now then every 2 hours,   STAT     05/23/19 1741   05/23/19 1742  Culture, blood (Routine x 2)  BLOOD CULTURE X 2,   STAT     05/23/19 1741   Signed and Held  Magnesium  Tomorrow morning,   R    Comments: Call MD if <1.5    Signed  and Held   Signed and Held  Phosphorus  Tomorrow morning,   R     Signed and Held   Signed and Held  TSH  Once,   R    Comments: Cancel if already done within 1 month and notify MD    Signed and Held   Signed and Held  Comprehensive metabolic panel  Once,   R    Comments: Cal MD for K<3.5 or >5.0    Signed and Held   Signed and Held  CBC  Once,   R    Comments: Call for hg <8.0    Signed and Held   Signed and Held  Procalcitonin  ONCE - STAT,   R     Signed and Held   Signed and Held  Type and screen [place]  Once,   R    Comments: Jay    Signed and Held          Vitals/Pain Today's Vitals   05/23/19 2130 05/23/19 2140 05/23/19 2140 05/23/19 2200  BP: 102/75   106/69  Pulse: 93     Resp: 20   (!) 0  Temp:      TempSrc:      SpO2: 95%     Weight:      Height:      PainSc:  7  7      Isolation Precautions Airborne and Contact precautions  Medications Medications  vancomycin (VANCOCIN) 1,500 mg in sodium chloride 0.9 % 500 mL IVPB (1,500 mg Intravenous New Bag/Given 05/23/19 2210)  sodium chloride flush (NS) 0.9 % injection 3 mL (3 mLs Intravenous Given 05/23/19 2110)  acetaminophen (TYLENOL) tablet 1,000 mg (1,000 mg Oral Given 05/23/19 1856)  fentaNYL (SUBLIMAZE) injection 100 mcg (100 mcg Intravenous Given 05/23/19 1853)  ceFEPIme (MAXIPIME) 2 g in sodium chloride 0.9 % 100 mL IVPB (2 g Intravenous New Bag/Given 05/23/19 2112)  morphine 4 MG/ML injection 4 mg (4 mg Intravenous Given 05/23/19 2110)    Mobility walks High fall risk   Focused Assessments NA   R Recommendations: See Admitting Provider Note  Report given to:   Additional Notes: NA

## 2019-05-23 NOTE — Telephone Encounter (Signed)
Spoke with home health -  Out to see the patient for PEG  managemnet and education ( [facility])  First visit . AMY , rn - states patient blood pressure is 94/60 . 96/60  Heart rate 88today   patient has not taken in medications. Patient medication are  bystolic 5 mg , hydralazine 25 mg b.id., amlodipine 10 mg , isosorbide  mn 120 mg  And prazosine 1 mg at bedtime.  patient is dx with  Cancer - bone metastases   no energy today , low grade fever .( patient does have the ability to take blood pressure at home   Home health want some parameter in regards to medication ,  Amy ,rn has also placed a call to cancer doctor an primary    aware will defer to Dr Debara Pickett and contact home health nurse

## 2019-05-23 NOTE — Telephone Encounter (Signed)
Patients call returned.  Patient identified by name and date of birth.  Patients home health nurse called and stated patient had blood pressures in the 90's over 60's both manually and automatically.  Patient has not taken any of his blood pressure medication.  Patient denies abdominal pain, chest pain except for the port placed recently.  Patient has no shortness of breath but is extremely weak.  Home health care Nurse indicated he looked pale.    Triage Nurse advised that patient go to the Emergency Room to be evaluated.  Patient acknowledged understanding of advice.

## 2019-05-24 ENCOUNTER — Ambulatory Visit
Admission: RE | Admit: 2019-05-24 | Discharge: 2019-05-24 | Disposition: A | Discharge status: Home / Self Care | Payer: Medicare Other | Source: Ambulatory Visit | Attending: Radiation Oncology | Admitting: Radiation Oncology

## 2019-05-24 ENCOUNTER — Telehealth: Payer: Self-pay | Admitting: *Deleted

## 2019-05-24 DIAGNOSIS — D709 Neutropenia, unspecified: Secondary | ICD-10-CM | POA: Diagnosis present

## 2019-05-24 DIAGNOSIS — D6959 Other secondary thrombocytopenia: Secondary | ICD-10-CM | POA: Diagnosis present

## 2019-05-24 DIAGNOSIS — C7951 Secondary malignant neoplasm of bone: Secondary | ICD-10-CM | POA: Diagnosis present

## 2019-05-24 DIAGNOSIS — E1122 Type 2 diabetes mellitus with diabetic chronic kidney disease: Secondary | ICD-10-CM | POA: Diagnosis present

## 2019-05-24 DIAGNOSIS — F419 Anxiety disorder, unspecified: Secondary | ICD-10-CM | POA: Diagnosis present

## 2019-05-24 DIAGNOSIS — I5032 Chronic diastolic (congestive) heart failure: Secondary | ICD-10-CM | POA: Diagnosis present

## 2019-05-24 DIAGNOSIS — N183 Chronic kidney disease, stage 3 (moderate): Secondary | ICD-10-CM | POA: Diagnosis present

## 2019-05-24 DIAGNOSIS — D61818 Other pancytopenia: Secondary | ICD-10-CM | POA: Diagnosis present

## 2019-05-24 DIAGNOSIS — J449 Chronic obstructive pulmonary disease, unspecified: Secondary | ICD-10-CM | POA: Diagnosis present

## 2019-05-24 DIAGNOSIS — I130 Hypertensive heart and chronic kidney disease with heart failure and stage 1 through stage 4 chronic kidney disease, or unspecified chronic kidney disease: Secondary | ICD-10-CM | POA: Diagnosis present

## 2019-05-24 DIAGNOSIS — K219 Gastro-esophageal reflux disease without esophagitis: Secondary | ICD-10-CM | POA: Diagnosis present

## 2019-05-24 DIAGNOSIS — R5081 Fever presenting with conditions classified elsewhere: Secondary | ICD-10-CM

## 2019-05-24 DIAGNOSIS — C787 Secondary malignant neoplasm of liver and intrahepatic bile duct: Secondary | ICD-10-CM | POA: Diagnosis present

## 2019-05-24 DIAGNOSIS — Z20828 Contact with and (suspected) exposure to other viral communicable diseases: Secondary | ICD-10-CM | POA: Diagnosis present

## 2019-05-24 DIAGNOSIS — D6481 Anemia due to antineoplastic chemotherapy: Secondary | ICD-10-CM | POA: Diagnosis present

## 2019-05-24 DIAGNOSIS — I2510 Atherosclerotic heart disease of native coronary artery without angina pectoris: Secondary | ICD-10-CM | POA: Diagnosis present

## 2019-05-24 DIAGNOSIS — E1142 Type 2 diabetes mellitus with diabetic polyneuropathy: Secondary | ICD-10-CM | POA: Diagnosis present

## 2019-05-24 DIAGNOSIS — C3411 Malignant neoplasm of upper lobe, right bronchus or lung: Secondary | ICD-10-CM | POA: Diagnosis present

## 2019-05-24 DIAGNOSIS — E785 Hyperlipidemia, unspecified: Secondary | ICD-10-CM | POA: Diagnosis present

## 2019-05-24 DIAGNOSIS — G609 Hereditary and idiopathic neuropathy, unspecified: Secondary | ICD-10-CM | POA: Diagnosis present

## 2019-05-24 DIAGNOSIS — E43 Unspecified severe protein-calorie malnutrition: Secondary | ICD-10-CM | POA: Diagnosis present

## 2019-05-24 DIAGNOSIS — A419 Sepsis, unspecified organism: Secondary | ICD-10-CM | POA: Diagnosis present

## 2019-05-24 DIAGNOSIS — F329 Major depressive disorder, single episode, unspecified: Secondary | ICD-10-CM | POA: Diagnosis present

## 2019-05-24 DIAGNOSIS — G8929 Other chronic pain: Secondary | ICD-10-CM | POA: Diagnosis present

## 2019-05-24 DIAGNOSIS — G4733 Obstructive sleep apnea (adult) (pediatric): Secondary | ICD-10-CM | POA: Diagnosis present

## 2019-05-24 LAB — COMPREHENSIVE METABOLIC PANEL
ALT: 19 U/L (ref 0–44)
AST: 17 U/L (ref 15–41)
Albumin: 2.7 g/dL — ABNORMAL LOW (ref 3.5–5.0)
Alkaline Phosphatase: 39 U/L (ref 38–126)
Anion gap: 5 (ref 5–15)
BUN: 26 mg/dL — ABNORMAL HIGH (ref 6–20)
CO2: 27 mmol/L (ref 22–32)
Calcium: 8 mg/dL — ABNORMAL LOW (ref 8.9–10.3)
Chloride: 111 mmol/L (ref 98–111)
Creatinine, Ser: 1.33 mg/dL — ABNORMAL HIGH (ref 0.61–1.24)
GFR calc Af Amer: >60 mL/min (ref >60)
GFR calc non Af Amer: >60 mL/min (ref >60)
Glucose, Bld: 78 mg/dL (ref 70–99)
Potassium: 3.8 mmol/L (ref 3.5–5.1)
Sodium: 143 mmol/L (ref 135–145)
Total Bilirubin: 0.5 mg/dL (ref 0.3–1.2)
Total Protein: 5.7 g/dL — ABNORMAL LOW (ref 6.5–8.1)

## 2019-05-24 LAB — CBC
HCT: 22.6 % — ABNORMAL LOW (ref 39.0–52.0)
Hemoglobin: 7.1 g/dL — ABNORMAL LOW (ref 13.0–17.0)
MCH: 27.6 pg (ref 26.0–34.0)
MCHC: 31.4 g/dL (ref 30.0–36.0)
MCV: 87.9 fL (ref 80.0–100.0)
Platelets: 31 10*3/uL — ABNORMAL LOW (ref 150–400)
RBC: 2.57 MIL/uL — ABNORMAL LOW (ref 4.22–5.81)
RDW: 13.4 % (ref 11.5–15.5)
WBC: 0.1 10*3/uL — CL (ref 4.0–10.5)
nRBC: 0 % (ref 0.0–0.2)

## 2019-05-24 LAB — GLUCOSE, CAPILLARY
Glucose-Capillary: 101 mg/dL — ABNORMAL HIGH (ref 70–99)
Glucose-Capillary: 66 mg/dL — ABNORMAL LOW (ref 70–99)
Glucose-Capillary: 76 mg/dL (ref 70–99)
Glucose-Capillary: 77 mg/dL (ref 70–99)
Glucose-Capillary: 81 mg/dL (ref 70–99)
Glucose-Capillary: 85 mg/dL (ref 70–99)
Glucose-Capillary: 86 mg/dL (ref 70–99)

## 2019-05-24 LAB — TSH: TSH: 1.988 u[IU]/mL (ref 0.350–4.500)

## 2019-05-24 LAB — PHOSPHORUS: Phosphorus: 3.2 mg/dL (ref 2.5–4.6)

## 2019-05-24 LAB — MAGNESIUM: Magnesium: 1.7 mg/dL (ref 1.7–2.4)

## 2019-05-24 LAB — PROCALCITONIN: Procalcitonin: 0.21 ng/mL

## 2019-05-24 MED ORDER — OSMOLITE 1.5 CAL PO LIQD
237.0000 mL | Freq: Three times a day (TID) | ORAL | Status: DC
  Administered 2019-05-24 – 2019-05-27 (×4): 237 mL
  Filled 2019-05-24 (×10): qty 237

## 2019-05-24 MED ORDER — INSULIN ASPART 100 UNIT/ML ~~LOC~~ SOLN
0.0000 [IU] | Freq: Three times a day (TID) | SUBCUTANEOUS | Status: DC
  Administered 2019-05-25: 08:00:00 1 [IU] via SUBCUTANEOUS

## 2019-05-24 MED ORDER — MORPHINE SULFATE (PF) 2 MG/ML IV SOLN
2.0000 mg | INTRAVENOUS | Status: DC | PRN
  Administered 2019-05-24 – 2019-05-27 (×17): 2 mg via INTRAVENOUS
  Filled 2019-05-24 (×18): qty 1

## 2019-05-24 MED ORDER — VANCOMYCIN HCL 10 G IV SOLR
1500.0000 mg | INTRAVENOUS | Status: DC
  Administered 2019-05-24 – 2019-05-25 (×2): 1500 mg via INTRAVENOUS
  Filled 2019-05-24 (×2): qty 1500

## 2019-05-24 MED ORDER — SODIUM CHLORIDE 0.9 % IV SOLN
2.0000 g | Freq: Three times a day (TID) | INTRAVENOUS | Status: DC
  Administered 2019-05-24 – 2019-05-27 (×10): 2 g via INTRAVENOUS
  Filled 2019-05-24 (×14): qty 2

## 2019-05-24 MED ORDER — NEBIVOLOL HCL 5 MG PO TABS
5.0000 mg | ORAL_TABLET | Freq: Every day | ORAL | Status: DC
  Administered 2019-05-24 – 2019-05-27 (×4): 5 mg via ORAL
  Filled 2019-05-24 (×4): qty 1

## 2019-05-24 MED ORDER — PRO-STAT SUGAR FREE PO LIQD
30.0000 mL | Freq: Three times a day (TID) | ORAL | Status: DC
  Administered 2019-05-24 – 2019-05-27 (×5): 30 mL
  Filled 2019-05-24 (×6): qty 30

## 2019-05-24 MED ORDER — SODIUM CHLORIDE 0.9 % IV SOLN
INTRAVENOUS | Status: DC
  Administered 2019-05-24: 10:00:00 via INTRAVENOUS

## 2019-05-24 MED ORDER — ENSURE ENLIVE PO LIQD
237.0000 mL | Freq: Two times a day (BID) | ORAL | Status: DC
  Administered 2019-05-24 – 2019-05-25 (×2): 237 mL via ORAL

## 2019-05-24 NOTE — Progress Notes (Signed)
[facility] Therapy Treatment Record Phone [phone]   Radiation Therapy was administered to [name] on: 05/24/2019  10:44 AM and was treatment # 6 out of a planned course of 15 treatments.  Radiation Treatment  1). Beam photons with 6-10 energy and Photons 6-10 MeV  2). Brachytherapy None  3). Stereotactic Radiosurgery None  4). Other Radiation None     Para [place]

## 2019-05-24 NOTE — Telephone Encounter (Signed)
Call fro Amy, RN w/[facility] pt went to ED this morning for fatigue and fever. Pt and wife [name] have asked Amy, RN to have palliative care consulted as pt is exhausted and needs additional help in the home. Amy, RN has discussed with pt and [name] to express these concerns while pt is admitted. Message forward to MD for review.

## 2019-05-24 NOTE — Progress Notes (Addendum)
Initial Nutrition Assessment   INTERVENTION:   -Until family can bring in TwoCal HN from home will substitute with Osmolite 1.5, 3 cartons (237 ml) daily to start with. -Provide Osm 1.5, 237 ml TID (0700, 1100, 1500). -Provide 30 ml Prostat TID (or suitable alternative) -This will provide 1365 kcal, 89g protein and 543 ml H2O.  -Patient can drink fluids to meet fluid needs -Continue Ensure Enlive po BID, each supplement provides 350 kcal and 20 grams of protein  NUTRITION DIAGNOSIS:   Increased nutrient needs related to cancer and cancer related treatments as evidenced by estimated needs.  GOAL:   Patient will meet greater than or equal to 90% of their needs  MONITOR:   PO intake, Supplement acceptance, Labs, Weight trends, TF tolerance, I & O's  REASON FOR ASSESSMENT:   Consult Enteral/tube feeding initiation and management  ASSESSMENT:   53 year old male with past medical history significant for small cell lung cancer undergoing chemotherapy, CAD, type 2 diabetes, hypertension, OSA, COPD who presents with fever, shortness of breath and hypotension.  His most recent chemotherapy treatment was 6/22.  He was noted to have fever up to 101 and presented to the emergency department.  He was admitted for further treatment and evaluation of neutropenic fever.  **RD working remotely**  Patient followed by [facility] RD, last evaluated on 6/30. At that time pt was able to tolerate 2.5 cartons of TwoCal HN a day with 3 Promod supplements. Goal per RD is to advance to 4 cartons daily with 3 Promods.   -Spoke with Pharmacy and currently TwoCal HN is not available or in stock at this time.  -Spoke with patient and he would prefer to still use TwoCal HN if possible and he was going to reach out to his wife to bring in his supply to use while admitted. -RD attempted to reach out to RN regarding plan without success. -Spoke with patient again on the phone and pt had still not reached out  to wife regarding bringing home supply of TwoCal in. -RD called patient a third time but he was currently working with PT/OT and RD would call back later. -4th attempt to call patient resulted in no answer.  Until family can bring TwoCal HN into the hospital, will substitute with bolus of Osmolite 1.5 and Prostat today. Since patient was only taking in ~2.5 cartons each day PTA will attempt to try 3 cartons a day to start. 3 cartons of Osm 1.5 and 30 ml Prostat TID will provide ~65% of kcal and ~84% of protein needs. Pt is also drinking Ensure supplements and he states he tolerates this fine as well as other fluids to meet his fluid needs.  Per weight records, pt has had a steady decline in weight since 09/14/18. Pt has had a 36% weight loss x 8 months which is significant for time frame.  Medications: Remeron tablet daily, Carafate tablet QID Labs reviewed: CBGs: 76-86   NUTRITION - FOCUSED PHYSICAL EXAM:  Unable to perform- working remotely.  Diet Order:   Diet Order            Diet Carb Modified Fluid consistency: Thin; Room service appropriate? Yes  Diet effective now              EDUCATION NEEDS:   Education needs have been addressed  Skin:  Skin Assessment: Reviewed RN Assessment  Last BM:  7/1  Height:   Ht Readings from Last 1 Encounters:  05/23/19 5\' 9"  (1.753 m)  Weight:   Wt Readings from Last 1 Encounters:  05/23/19 72.6 kg    Ideal Body Weight:  72.7 kg  BMI:  Body mass index is 23.63 kg/m.  Estimated Nutritional Needs:   Kcal:  2100-2400  Protein:  105-125g  Fluid:  2.2L/day  Clayton Bibles, MS, RD, LDN [place] Dietitian Pager: [phone] After Hours Pager: [phone]

## 2019-05-24 NOTE — Evaluation (Signed)
Physical Therapy Evaluation Patient Details Name: Andrew West MRN: [id number] DOB: 07/22/1966 Today's Date: 05/24/2019   History of Present Illness  53 year old male with past medical history significant for small cell lung cancer undergoing chemotherapy, CAD, type 2 diabetes, hypertension, OSA, COPD who presents with fever, shortness of breath and hypotension.  His most recent chemotherapy treatment was 6/22. He was admitted for further treatment and evaluation of neutropenic fever.    Clinical Impression  Pt presents with generalized deconditioning, decreased activity tolerance, and fatigue with mobility. Pt to benefit from acute PT to address deficits. Pt ambulated hallway distance with cane, no unsteadiness or LOB noted. PT moves well, just requires increased time. Pt requires rest breaks with mobility due to fatigue. PT recommending no PT follow up, will continue to follow acutely do avoid deconditioning and maintain pt's mobility status. PT to progress mobility as tolerated.    Follow Up Recommendations No PT follow up;Supervision for mobility/OOB    Equipment Recommendations  None recommended by PT    Recommendations for Other Services       Precautions / Restrictions Precautions Precautions: Fall(low fall risk ) Precaution Comments: port left chest, g-tube Restrictions Weight Bearing Restrictions: No      Mobility  Bed Mobility Overal bed mobility: Modified Independent             General bed mobility comments: increased effort, HOB slightly elevated but no physical assist required   Transfers Overall transfer level: Needs assistance Equipment used: Straight cane Transfers: Sit to/from Stand Sit to Stand: Supervision         General transfer comment: for lines, safety. Increased time to come to standing, suspect due to back pain.  Ambulation/Gait Ambulation/Gait assistance: Min guard;Supervision Gait Distance (Feet): 240 Feet Assistive device: Straight  cane Gait Pattern/deviations: Step-through pattern;Decreased stride length;Trunk flexed Gait velocity: decr   General Gait Details: Min guard initially, transitioning to superivison for safety. Slow, steady gait with standing rest break x1 minute at 120 ft.  Stairs            Wheelchair Mobility    Modified Rankin (Stroke Patients Only)       Balance Overall balance assessment: No apparent balance deficits (not formally assessed)                                           Pertinent Vitals/Pain Pain Assessment: 0-10 Pain Score: 8  Pain Location: back, bil LEs Pain Descriptors / Indicators: Guarding;Sore;Discomfort Pain Intervention(s): Monitored during session;Limited activity within patient's tolerance;Repositioned    Home Living Family/patient expects to be discharged to:: Private residence Living Arrangements: Spouse/significant other Available Help at Discharge: Family Type of Home: House Home Access: Stairs to enter Entrance Stairs-Rails: Can reach both;Left;Right Entrance Stairs-Number of Steps: 2 Home Layout: One level Home Equipment: Cane - single point;Shower seat      Prior Function Level of Independence: Independent with assistive device(s)         Comments: uses cane for mobility; drives     Hand Dominance   Dominant Hand: Right    Extremity/Trunk Assessment   Upper Extremity Assessment Upper Extremity Assessment: Generalized weakness(per OT note)    Lower Extremity Assessment Lower Extremity Assessment: Generalized weakness    Cervical / Trunk Assessment Cervical / Trunk Assessment: Normal  Communication   Communication: No difficulties  Cognition Arousal/Alertness: Awake/alert;Lethargic(lethargic initially, more awake as session continued)  Behavior During Therapy: WFL for tasks assessed/performed Overall Cognitive Status: Within Functional Limits for tasks assessed                                         General Comments General comments (skin integrity, edema, etc.): HR up to 104 with activity    Exercises     Assessment/Plan    PT Assessment Patient needs continued PT services  PT Problem List Decreased strength;Decreased mobility;Decreased activity tolerance       PT Treatment Interventions DME instruction;Therapeutic activities;Gait training;Therapeutic exercise;Patient/family education;Balance training;Stair training;Functional mobility training    PT Goals (Current goals can be found in the Care Plan section)  Acute Rehab PT Goals Patient Stated Goal: home soon PT Goal Formulation: With patient Time For Goal Achievement: 06/07/19 Potential to Achieve Goals: Good    Frequency Min 3X/week   Barriers to discharge        Co-evaluation PT/OT/SLP Co-Evaluation/Treatment: Yes Reason for Co-Treatment: Complexity of the patient's impairments (multi-system involvement)(decreased activity tolerance, drowsiness/fatigue) PT goals addressed during session: Mobility/safety with mobility OT goals addressed during session: ADL's and self-care       AM-PAC PT "6 Clicks" Mobility  Outcome Measure Help needed turning from your back to your side while in a flat bed without using bedrails?: None Help needed moving from lying on your back to sitting on the side of a flat bed without using bedrails?: None Help needed moving to and from a bed to a chair (including a wheelchair)?: None Help needed standing up from a chair using your arms (e.g., wheelchair or bedside chair)?: None Help needed to walk in hospital room?: A Little Help needed climbing 3-5 steps with a railing? : A Little 6 Click Score: 22    End of Session Equipment Utilized During Treatment: (pt defers gait belt) Activity Tolerance: Patient tolerated treatment well Patient left: in chair;with call bell/phone within reach Nurse Communication: Mobility status PT Visit Diagnosis: Other abnormalities of gait and  mobility (R26.89);Difficulty in walking, not elsewhere classified (R26.2)    Time: 1206-1226 PT Time Calculation (min) (ACUTE ONLY): 20 min   Charges:   PT Evaluation $PT Eval Low Complexity: 1 Low        Cam Harnden Terrial Rhodes, PT Acute Rehabilitation Services Pager [phone]  Office [phone]   Mabel Unrein D Genie Mirabal 05/24/2019, 1:10 PM

## 2019-05-24 NOTE — Evaluation (Signed)
Occupational Therapy Evaluation Patient Details Name: Andrew West MRN: [id number] DOB: November 15, 1966 Today's Date: 05/24/2019    History of Present Illness 53 year old male with past medical history significant for small cell lung cancer undergoing chemotherapy, CAD, type 2 diabetes, hypertension, OSA, COPD who presents with fever, shortness of breath and hypotension.  His most recent chemotherapy treatment was 6/22. He was admitted for further treatment and evaluation of neutropenic fever.   Clinical Impression   This 53 y/o male presents with the above. PTA pt reports he is mod independent with ADL and functional mobility using SPC, driving. Pt presenting with generalized weakness, tolerating activity well but increasingly fatigued post OT/PT session. He currently requires minguard-supervision during functional mobility using SPC. Completing LB ADL and standing grooming ADL with minguard assist. Pt reports he lives with spouse who assists with iADL tasks at home. He will benefit from continued acute OT services to maximize his endurance, safety and independence with ADL and moboility prior to return home. Do not anticipate pt will require follow up OT services.     Follow Up Recommendations  No OT follow up;Supervision - Intermittent    Equipment Recommendations  None recommended by OT           Precautions / Restrictions Precautions Precautions: Fall(low fall risk ) Precaution Comments: port left chest, g-tube Restrictions Weight Bearing Restrictions: No      Mobility Bed Mobility Overal bed mobility: Modified Independent             General bed mobility comments: increased effort, HOB slightly elevated but no physical assist required   Transfers Overall transfer level: Needs assistance Equipment used: Straight cane Transfers: Sit to/from Stand Sit to Stand: Supervision         General transfer comment: for lines, safety    Balance Overall balance assessment:  No apparent balance deficits (not formally assessed)                                         ADL either performed or assessed with clinical judgement   ADL Overall ADL's : Needs assistance/impaired Eating/Feeding: Modified independent;Sitting   Grooming: Wash/dry hands;Supervision/safety;Standing   Upper Body Bathing: Set up;Supervision/ safety;Sitting   Lower Body Bathing: Supervison/ safety;Sit to/from stand   Upper Body Dressing : Set up;Sitting   Lower Body Dressing: Min guard;Sit to/from stand Lower Body Dressing Details (indicate cue type and reason): pt donning shoes seated EOB with setup assist today; supervision-minguard for standing balance Toilet Transfer: Min guard;Ambulation Toilet Transfer Details (indicate cue type and reason): simulated via transfer to recliner, room/hallway level mobility Toileting- Clothing Manipulation and Hygiene: Supervision/safety;Sit to/from stand Toileting - Clothing Manipulation Details (indicate cue type and reason): pt voiding bladder in urinal start of session seated EOB     Functional mobility during ADLs: Supervision/safety;Min guard General ADL Comments: pt mostly limited due to decreased activity tolerance, weakness at this time     Vision         Perception     Praxis      Pertinent Vitals/Pain Pain Assessment: 0-10 Pain Score: 8  Pain Location: back, bil LEs Pain Descriptors / Indicators: Guarding;Sore;Discomfort Pain Intervention(s): Monitored during session;Limited activity within patient's tolerance;Repositioned     Hand Dominance Right   Extremity/Trunk Assessment Upper Extremity Assessment Upper Extremity Assessment: Generalized weakness   Lower Extremity Assessment Lower Extremity Assessment: Defer to PT evaluation  Communication Communication Communication: No difficulties   Cognition Arousal/Alertness: Awake/alert;Lethargic(lethargic initially, more awake as session  continued) Behavior During Therapy: WFL for tasks assessed/performed Overall Cognitive Status: Within Functional Limits for tasks assessed                                     General Comments  HR up to 104 with activity    Exercises     Shoulder Instructions      Home Living Family/patient expects to be discharged to:: Private residence Living Arrangements: Spouse/significant other Available Help at Discharge: Family Type of Home: House Home Access: Stairs to enter Secretary/administrator of Steps: 2 Entrance Stairs-Rails: Can reach both;Left;Right Home Layout: One level     Bathroom Shower/Tub: Tub/shower unit(one of each)   Bathroom Toilet: Handicapped height     Home Equipment: Cane - single point;Shower seat          Prior Functioning/Environment Level of Independence: Independent with assistive device(s)        Comments: uses cane for mobility; drives        OT Problem List: Decreased strength;Decreased activity tolerance;Pain;Decreased knowledge of use of DME or AE      OT Treatment/Interventions: Self-care/ADL training;Therapeutic exercise;Energy conservation;DME and/or AE instruction;Therapeutic activities;Patient/family education;Balance training    OT Goals(Current goals can be found in the care plan section) Acute Rehab OT Goals Patient Stated Goal: home soon OT Goal Formulation: With patient Time For Goal Achievement: 06/07/19 Potential to Achieve Goals: Good  OT Frequency: Min 2X/week   Barriers to D/C:            Co-evaluation PT/OT/SLP Co-Evaluation/Treatment: Yes Reason for Co-Treatment: Complexity of the patient's impairments (multi-system involvement)(pt initially lethargic, decreased activity tolerance)   OT goals addressed during session: ADL's and self-care      AM-PAC OT "6 Clicks" Daily Activity     Outcome Measure Help from another person eating meals?: None Help from another person taking care of personal  grooming?: None Help from another person toileting, which includes using toliet, bedpan, or urinal?: None Help from another person bathing (including washing, rinsing, drying)?: A Little Help from another person to put on and taking off regular upper body clothing?: None Help from another person to put on and taking off regular lower body clothing?: A Little 6 Click Score: 22   End of Session Equipment Utilized During Treatment: Other (comment)(cane) Nurse Communication: Mobility status  Activity Tolerance: Patient tolerated treatment well Patient left: in chair;with call bell/phone within reach  OT Visit Diagnosis: Muscle weakness (generalized) (M62.81);Pain Pain - Right/Left: (bil) Pain - part of body: Leg(back, bil legs)                Time: 6213-0865 OT Time Calculation (min): 23 min Charges:  OT General Charges $OT Visit: 1 Visit OT Evaluation $OT Eval Moderate Complexity: 1 Mod  Marcy Siren, OT [employer] Pager [phone] Office [phone]   Orlando Penner 05/24/2019, 12:50 PM

## 2019-05-24 NOTE — Progress Notes (Signed)
Pharmacy Antibiotic Note  Andrew West is a 53 y.o. male admitted on 05/23/2019 with sepsis.  Pharmacy has been consulted for Vancomycin, cefepime dosing.  Plan: Vancomycin 1.5gm iv x1, then Vancomycin 1500 mg IV Q 24 hrs. Goal AUC 400-550. Expected AUC: 522 SCr used: 1.42  Cefepime 2gm iv x1, then 2gm iv q8hr   Height: 5\' 9"  (175.3 cm) Weight: 160 lb (72.6 kg) IBW/kg (Calculated) : 70.7  Temp (24hrs), Avg:99.5 F (37.5 C), Min:98.3 F (36.8 C), Max:101.4 F (38.6 C)  Recent Labs  Lab 05/22/19 0922 05/23/19 1900 05/23/19 2055  WBC 3.6* 0.1*  --   CREATININE 1.50* 1.42*  --   LATICACIDVEN  --   --  0.8    Estimated Creatinine Clearance: 60.9 mL/min (A) (by C-G formula based on SCr of 1.42 mg/dL (H)).    Allergies  Allergen Reactions  . Iohexol Anaphylaxis    PT. TO BE PREMEDICATED PRIOR TO IV CONTRAST PER DR Andrew West /MMS//12/15/15Desc: PT BECAME SOB AND CHEST TIGHTNESS AFTER CONTRAST INJECTION.  Andrew West,RT-RCT., Onset Date: [id number]     Antimicrobials this admission: Vancomycin 05/24/2019 >> Cefepime 05/24/2019 >>   Dose adjustments this admission: -  Microbiology results: -  Thank you for allowing pharmacy to be a part of this patient's care.  Andrew West 05/24/2019 3:37 AM

## 2019-05-24 NOTE — Progress Notes (Signed)
HEMATOLOGY-ONCOLOGY PROGRESS NOTE  SUBJECTIVE: Andrew West has been admitted with febrile neutropenia.  He reports he had a fever up to 101 at home.  He also reports sinus congestion and cough.  Chest x-ray on admission did not show any acute abnormality.  CBC on admission showed a white blood cell count of 0.1, hemoglobin 7.9 count of 42,000.  COVID-19 testing negative.  Blood cultures have been obtained and are negative to date.  The patient has been afebrile since admission.  Denies chills.  Still has sinus congestion and nonproductive cough.  He denies chest discomfort and shortness of breath.  Reports abdominal discomfort but no nausea, vomiting, constipation, diarrhea.  Denies bleeding.  Last chemotherapy was started on 05/16/2019.  He received Neulasta on 05/19/2019.  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[place])   09/12/2018 - 12/24/2018 Chemotherapy   The patient had palonosetron (ALOXI) injection 0.25 mg, 0.25 mg, Intravenous,  Once, 4 of 6 cycles Administration: 0.25 mg (09/12/2018), 0.25 mg (10/03/2018), 0.25 mg (10/24/2018), 0.25 mg (11/23/2018) CISplatin (PLATINOL) 186 mg in sodium chloride 0.9 % 500 mL chemo infusion, 80 mg/m2 = 186 mg, Intravenous,  Once, 4 of 6 cycles Dose modification: 60 mg/m2 (original dose 80 mg/m2, Cycle 5, Reason: Other (see comments), Comment: previous dose was at 60mg /m2), 60 mg/m2 (original dose 80 mg/m2, Cycle 4, Reason: Provider Judgment) Administration: 186 mg (09/12/2018), 186 mg (10/03/2018), 186 mg (10/24/2018), 124 mg (11/23/2018) etoposide (VEPESID) 230 mg in sodium chloride 0.9 % 1,000 mL chemo infusion, 100 mg/m2 = 230 mg, Intravenous,  Once, 4 of 6 cycles Dose modification: 90 mg/m2 (original dose 100 mg/m2, Cycle 5, Reason: Provider Judgment), 90 mg/m2 (original dose 100 mg/m2, Cycle 5, Reason: Provider Judgment), 90 mg/m2 (original dose 100  mg/m2, Cycle 4, Reason: Provider Judgment) Administration: 230 mg (09/12/2018), 230 mg (09/13/2018), 230 mg (09/14/2018), 230 mg (10/03/2018), 230 mg (10/04/2018), 230 mg (10/24/2018), 230 mg (10/25/2018), 230 mg (10/26/2018), 190 mg (11/24/2018), 190 mg (11/23/2018) fosaprepitant (EMEND) 150 mg, dexamethasone (DECADRON) 12 mg in sodium chloride 0.9 % 145 mL IVPB, , Intravenous,  Once, 4 of 6 cycles Administration:  (09/12/2018),  (10/03/2018),  (10/24/2018),  (11/23/2018)  for chemotherapy treatment.    05/16/2019 -  Chemotherapy   The patient had palonosetron (ALOXI) injection 0.25 mg, 0.25 mg, Intravenous,  Once, 1 of 4 cycles Administration: 0.25 mg (05/16/2019) pegfilgrastim-cbqv (UDENYCA) injection 6 mg, 6 mg, Subcutaneous, Once, 1 of 4 cycles Administration: 6 mg (05/19/2019) CARBOplatin (PARAPLATIN) 350 mg in sodium chloride 0.9 % 250 mL chemo infusion, 350 mg (100 % of original dose 349.5 mg), Intravenous,  Once, 1 of 4 cycles Dose modification: 349.5 mg (original dose 349.5 mg, Cycle 1) Administration: 350 mg (05/16/2019) etoposide (VEPESID) 190 mg in sodium chloride 0.9 % 500 mL chemo infusion, 100 mg/m2 = 190 mg, Intravenous,  Once, 1 of 4 cycles Administration: 190 mg (05/16/2019), 190 mg (05/17/2019), 190 mg (05/18/2019) fosaprepitant (EMEND) 150 mg, dexamethasone (DECADRON) 12 mg in sodium chloride 0.9 % 145 mL IVPB, , Intravenous,  Once, 1 of 4 cycles Administration:  (05/16/2019) durvalumab (IMFINZI) 1,500 mg in sodium chloride 0.9 % 100 mL chemo infusion, 1,500 mg, Intravenous,  Once, 1 of 8 cycles Administration: 1,500 mg (05/16/2019)  for chemotherapy treatment.       REVIEW OF SYSTEMS:   Constitutional: Had fevers prior to admission. Eyes: Denies blurriness of vision  Ears, nose, mouth, throat, and face: Denies mucositis or sore throat Respiratory: Reports nonproductive cough.  Denies shortness of breath. Cardiovascular: Denies palpitation, chest discomfort Gastrointestinal: Reports  mild abdominal discomfort.  Denies nausea, heartburn or change in bowel habits Skin: Denies abnormal skin rashes Lymphatics: Denies new lymphadenopathy or easy bruising Neurological:Denies numbness, tingling or new weaknesses Behavioral/Psych: Mood is stable, no new changes  Extremities: No lower extremity edema All other systems were reviewed with the patient and are negative.  I have reviewed the past medical history, past surgical history, social history and family history with the patient and they are unchanged from previous note.   PHYSICAL EXAMINATION: ECOG PERFORMANCE STATUS: 1 - Symptomatic but completely ambulatory  Vitals:   05/24/19 0224 05/24/19 0610  BP: 115/77 (!) 131/96  Pulse: 79 73  Resp: 20 20  Temp: 98.7 F (37.1 C) 99 F (37.2 C)  SpO2: 99% 100%   Filed Weights   05/23/19 1718 05/23/19 1733 05/23/19 1849  Weight: 160 lb (72.6 kg) 150 lb (68 kg) 160 lb (72.6 kg)    Intake/Output from previous day: 07/01 0701 - 07/02 0700 In: 1440 [P.O.:240; I.V.:497.5; IV Piggyback:702.5] Out: 400 [Urine:400]  GENERAL:alert, no distress and comfortable SKIN: skin color, texture, turgor are normal, no rashes or significant lesions EYES: normal, Conjunctiva are pink and non-injected, sclera clear OROPHARYNX:no exudate, no erythema and lips, buccal mucosa, and tongue normal  NECK: supple, thyroid normal size, non-tender, without nodularity LYMPH:  no palpable lymphadenopathy in the cervical, axillary or inguinal LUNGS: clear to auscultation and percussion with normal breathing effort HEART: regular rate & rhythm and no murmurs and no lower extremity edema ABDOMEN:abdomen soft, non-tender and normal bowel sounds Musculoskeletal:no cyanosis of digits and no clubbing  NEURO: alert & oriented x 3 with fluent speech, no focal motor/sensory deficits  LABORATORY DATA:  I have reviewed the data as listed CMP Latest Ref Rng & Units 05/24/2019 05/23/2019 05/22/2019  Glucose 70 - 99  mg/dL 78 90 110(H)  BUN 6 - 20 mg/dL 26(H) 27(H) 21(H)  Creatinine 0.61 - 1.24 mg/dL 1.33(H) 1.42(H) 1.50(H)  Sodium 135 - 145 mmol/L 143 143 143  Potassium 3.5 - 5.1 mmol/L 3.8 3.9 3.6  Chloride 98 - 111 mmol/L 111 109 108  CO2 22 - 32 mmol/L 27 28 26   Calcium 8.9 - 10.3 mg/dL 8.0(L) 8.4(L) 8.3(L)  Total Protein 6.5 - 8.1 g/dL 5.7(L) 6.3(L) 6.1(L)  Total Bilirubin 0.3 - 1.2 mg/dL 0.5 0.3 0.4  Alkaline Phos 38 - 126 U/L 39 45 55  AST 15 - 41 U/L 17 18 20   ALT 0 - 44 U/L 19 21 22     Lab Results  Component Value Date   WBC 0.1 (LL) 05/24/2019   HGB 7.1 (L) 05/24/2019   HCT 22.6 (L) 05/24/2019   MCV 87.9 05/24/2019   PLT 31 (L) 05/24/2019   NEUTROABS 3.2 05/22/2019    Mr Brain W Wo Contrast  Result Date: 05/21/2019 CLINICAL DATA:  53 year old male with small cell lung cancer, status post prophylactic all brain radiation completing on 02/27/2019. Restaging. EXAM: MRI HEAD WITHOUT AND WITH CONTRAST TECHNIQUE: Multiplanar, multiecho pulse sequences of the brain and surrounding structures were obtained without and with intravenous contrast. CONTRAST:  7 milliliters Gadavist COMPARISON:  Brain MRI 01/25/2019 and earlier. FINDINGS: Brain: No abnormal enhancement identified. No midline shift, mass effect, or evidence of intracranial mass lesion. No dural thickening. No restricted diffusion to suggest acute infarction. No ventriculomegaly, extra-axial collection or acute intracranial hemorrhage.  Cervicomedullary junction and pituitary are within normal limits. Stable cerebral volume. Stable minimal nonspecific cerebral white matter T2 and FLAIR hyperintensity. No cortical encephalomalacia or chronic blood products. Vascular: Major intracranial vascular flow voids Are stable. The distal left vertebral artery appears mildly dominant. The major dural venous sinuses are enhancing and appear to be patent. Skull and upper cervical spine: Negative visible cervical spine and spinal cord. Visualized bone  marrow signal is within normal limits. Sinuses/Orbits: Negative orbits. Stable small right maxillary sinus mucous retention cyst. Other: Mastoids remain clear. Visible internal auditory structures appear normal. Stable scalp and face soft tissues. IMPRESSION: No metastatic disease or acute intracranial abnormality identified status post whole brain radiation. Electronically Signed   By: Genevie Ann M.D.   On: 05/21/2019 20:40   Ir Gastrostomy Tube  Result Date: 05/17/2019 INDICATION: 53 year old with metastatic small cell lung cancer. Patient has malnutrition and poor oral intake. Prior gastrostomy tube was removed earlier in the year. EXAM: PERCUTANEOUS GASTROSTOMY TUBE WITH FLUOROSCOPIC GUIDANCE Physician: Stephan Minister. Anselm Pancoast, MD MEDICATIONS: Ancef 2 g; Antibiotics were administered within 1 hour of the procedure. ANESTHESIA/SEDATION: Versed 2.0 mg IV; Fentanyl 100 mcg IV Moderate Sedation Time:  34 minutes The patient was continuously monitored during the procedure by the interventional radiology nurse under my direct supervision. FLUOROSCOPY TIME:  Fluoroscopy Time: 2 minutes, 48 seconds, 11 mGy COMPLICATIONS: None immediate. PROCEDURE: The procedure was explained to the patient. The risks and benefits of the procedure were discussed and the patient's questions were addressed. Informed consent was obtained from the patient. Transverse colon contains gas and stool and identified with fluoroscopy. The patient was placed on the interventional table. An orogastric tube was placed with fluoroscopic guidance. The anterior abdomen was prepped and draped in sterile fashion. Maximal barrier sterile technique was utilized including caps, mask, sterile gowns, sterile gloves, sterile drape, hand hygiene and skin antiseptic. Stomach was inflated with air through the orogastric tube. The skin and subcutaneous tissues were anesthetized with 1% lidocaine. Incision was made at the old gastrostomy tube site. A 17 gauge needle was  directed into the distended stomach with fluoroscopic guidance. A stiff Amplatz wire was advanced into the stomach. A 9-French vascular sheath was placed and the orogastric tube was snared using a Gooseneck snare device. The orogastric tube and snare were pulled out of the patient's mouth. The snare device was connected to a 24-French gastrostomy tube. The snare device and gastrostomy tube were pulled through the patient's mouth and out the anterior abdominal wall. The gastrostomy tube was cut to an appropriate length. Air was injected through gastrostomy tube and confirmed placement within the stomach. Fluoroscopic images were obtained for documentation. The gastrostomy tube was flushed with normal saline. IMPRESSION: Successful fluoroscopic guided percutaneous gastrostomy tube placement. Electronically Signed   By: Markus Daft M.D.   On: 05/17/2019 16:14   Nm Pet Image Initial (pi) Skull Base To Thigh  Result Date: 05/03/2019 CLINICAL DATA:  Subsequent treatment strategy for staging of lung cancer. EXAM: NUCLEAR MEDICINE PET SKULL BASE TO THIGH TECHNIQUE: 8.7 mCi F-18 FDG was injected intravenously. Full-ring PET imaging was performed from the skull base to thigh after the radiotracer. CT data was obtained and used for attenuation correction and anatomic localization. Fasting blood glucose: 70 mg/dl COMPARISON:  PET of 08/21/2018.  Diagnostic CTs of 03/04/2019. FINDINGS: Mediastinal blood pool activity: SUV max 2.4 Liver activity: SUV max NA NECK: No areas of abnormal hypermetabolism. Incidental CT findings: Right maxillary sinus mucous retention cyst or polyp.  Bilateral carotid atherosclerosis. CHEST: The previously the described middle mediastinal and right hilar nodal hypermetabolism has resolved. A node at the level of the thoracic inlet measures 8 mm and a S.U.V. max of 7.4 on image 46/4. Vague, posterior right upper lobe 1.3 cm pulmonary opacity with hypermetabolism measures a S.U.V. max of 3.9 on image  22/8 and is new since 03/04/2019. Multifocal low-level pleural hypermetabolism with concurrent pleural thickening. Example at a S.U.V. max of 2.5 on image 89/4. Incidental CT findings: Resolution of right pleural fluid. Multivessel coronary artery atherosclerosis. Aortic atherosclerosis. Left Port-A-Cath tip low SVC. Centrilobular and paraseptal emphysema. ABDOMEN/PELVIS: Hypermetabolic high left hepatic lobe 11 mm lesion on image 102/4 measures a S.U.V. max of 10.0. A vague area of more mild right hepatic lobe hypermetabolism is without well-defined CT correlate and measures a S.U.V. max of 4.5, including on approximately image 108/4. No abdominopelvic nodal hypermetabolism. Likely physiologic anal hypermetabolism without CT correlate. This measures a S.U.V. max of 6.9. Incidental CT findings: Abdominal aortic and branch vessel atherosclerosis. Large colonic stool burden. SKELETON: Development of multifocal osseous metastasis. Examples within the right side of the C2 vertebral body and anterior right femoral neck. Index lesion within the L4 vertebral body measures a S.U.V. max of 16.3. Incidental CT findings: none IMPRESSION: 1. Since the PET of 08/21/2018, mixed response to therapy. 2. The right paratracheal and right hilar nodal hypermetabolism has resolved. However, there has been interval development of hepatic and osseous metastasis, as well as a new hypermetabolic node at the level of the thoracic inlet. 3. Foci of right-sided pleural based hypermetabolism are indeterminate but suspicious for pleural metastasis. 4. Aortic atherosclerosis (ICD10-I70.0), coronary artery atherosclerosis and emphysema (ICD10-J43.9). 5. Right upper lobe hypermetabolic pulmonary opacity, favored to represent an area of infection or inflammation. Electronically Signed   By: Abigail Miyamoto M.D.   On: 05/03/2019 09:24   Dg Chest Portable 1 View  Result Date: 05/23/2019 CLINICAL DATA:  Fever and weakness EXAM: PORTABLE CHEST 1 VIEW  COMPARISON:  05/03/2019 FINDINGS: Cardiac shadow is stable. Left-sided chest wall port is again noted and stable. The lungs are well aerated without focal infiltrate or sizable effusion. Some scarring is noted in the right upper lobe. No focal infiltrate is seen. No effusion is noted. IMPRESSION: No acute abnormality seen. Electronically Signed   By: Inez Catalina M.D.   On: 05/23/2019 18:54    ASSESSMENT AND PLAN: 1.  Febrile neutropenia 2.  Metastatic small cell lung cancer 3.  Anemia secondary to chemotherapy 4.  Thrombocytopenia secondary to chemotherapy  -Continue to monitor daily CBC.  He is already received Neulasta so no need for any additional growth factor support.  Transfuse packed red blood cells for hemoglobin less than 7.0 or active bleeding.  Transfuse platelets for platelet count less than 10,000 or active bleeding.  Anticipate that his counts will slowly recover.   -I have notified radiation oncology of the patient's admission and pancytopenia.  Decision regarding continuing or holding treatment will be determined by radiation oncology.    LOS: 0 days   Mikey Bussing, DNP, AGPCNP-BC, AOCNP 05/24/19

## 2019-05-24 NOTE — Progress Notes (Addendum)
PROGRESS NOTE    OTHMAN COCCHIOLA  GMW:[id number] DOB: 04/14/1966 DOA: 05/23/2019 PCP: [name], MD     Brief Narrative:  GENEVA LEAVELLE is a 53 year old male with past medical history significant for small cell lung cancer undergoing chemotherapy, CAD, type 2 diabetes, hypertension, OSA, COPD who presents with fever, shortness of breath and hypotension.  His most recent chemotherapy treatment was 6/22.  He was noted to have fever up to 101 and presented to the emergency department.  He was admitted for further treatment and evaluation of neutropenic fever.  New events last 24 hours / Subjective: Feeling slightly better today.  Denies any fevers or chills overnight.  No complaints of nausea, vomiting or abdominal pain or diarrhea.  States that he has a runny nose but no cough.  Assessment & Plan:   Active Problems:   Diabetes mellitus (HCC)   HLD (hyperlipidemia)   Hereditary and idiopathic peripheral neuropathy   GERD   COPD with asthma (HCC)   Hypertension   CAD S/P multiple PCI's   Diastolic dysfunction-grade 2 with EF 60-65% Oct 2016   Benign prostatic hyperplasia with urinary hesitancy   Type 2 diabetes mellitus (HCC)   Sleep apnea   CKD (chronic kidney disease) stage 3, GFR 30-59 ml/min (HCC)   Bone metastases (HCC)   Neutropenic fever (HCC)    Sepsis secondary to neutropenic fever -Presented with fever 101.4, HR 99, WBC 0.1 -Lactic acid 0.8, procalcitonin 0.21 -Blood cultures pending -Chest x-ray negative for acute infiltrate -Urinalysis unremarkable for acute infection -SARS-CoV-2 negative -Empiric vancomycin, cefepime, Flagyl  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 -Oncology consulted  CAD -Continue ranexa, Bystolic  CKD stage III -Baseline creatinine 1.4-1.5 -Stable  Hyperlipidemia -Continue crestor   Type 2 diabetes -SSI   Depression -Continue Cymbalta   DVT prophylaxis: SCD Code Status: Full code Family Communication: None  Disposition Plan: Pending further improvement and treatment of neutropenic fever   Consultants:   Oncology  Procedures:   None  Antimicrobials:  Anti-infectives (From admission, onward)   Start     Dose/Rate Route Frequency Ordered Stop   05/24/19 2200  vancomycin (VANCOCIN) 1,500 mg in sodium chloride 0.9 % 500 mL IVPB     1,500 mg 250 mL/hr over 120 Minutes Intravenous Every 24 hours 05/24/19 0336     05/24/19 0600  ceFEPIme (MAXIPIME) 2 g in sodium chloride 0.9 % 100 mL IVPB     2 g 200 mL/hr over 30 Minutes Intravenous Every 8 hours 05/24/19 0336     05/23/19 2300  metroNIDAZOLE (FLAGYL) IVPB 500 mg     500 mg 100 mL/hr over 60 Minutes Intravenous Every 8 hours 05/23/19 2252     05/23/19 2100  vancomycin (VANCOCIN) 1,500 mg in sodium chloride 0.9 % 500 mL IVPB     1,500 mg 250 mL/hr over 120 Minutes Intravenous  Once 05/23/19 2051 05/24/19 0020   05/23/19 2100  ceFEPIme (MAXIPIME) 2 g in sodium chloride 0.9 % 100 mL IVPB     2 g 200 mL/hr over 30 Minutes Intravenous  Once 05/23/19 2051 05/23/19 2142       Objective: Vitals:   05/23/19 2230 05/23/19 2358 05/24/19 0224 05/24/19 0610  BP: 104/70 111/76 115/77 (!) 131/96  Pulse: 95 82 79 73  Resp: (!) 23 20 20 20   Temp:  98.3 F (36.8 C) 98.7 F (37.1 C) 99 F (37.2 C)  TempSrc:  Oral Oral Oral  SpO2: 96% 96% 99% 100%  Weight:      Height:        Intake/Output Summary (Last 24 hours) at 05/24/2019 1112 Last data filed at 05/24/2019 0600 Gross per 24 hour  Intake 1439.96 ml  Output 400 ml  Net 1039.96 ml   Filed Weights   05/23/19 1718 05/23/19 1733 05/23/19 1849  Weight: 72.6 kg 68 kg 72.6 kg    Examination:  General exam: Appears calm and comfortable  Respiratory system: Clear to auscultation. Respiratory effort normal. Cardiovascular system: S1 & S2 heard, RRR. No JVD, murmurs, rubs, gallops or clicks. No pedal edema. Gastrointestinal system: Abdomen is nondistended, soft and nontender. No organomegaly  or masses felt. Normal bowel sounds heard. Central nervous system: Alert and oriented. No focal neurological deficits. Extremities: Symmetric 5 x 5 power. Skin: No rashes, lesions or ulcers Psychiatry: Judgement and insight appear normal. Mood & affect appropriate.   Data Reviewed: I have personally reviewed following labs and imaging studies  CBC: Recent Labs  Lab 05/22/19 0922 05/23/19 1900 05/24/19 0407  WBC 3.6* 0.1* 0.1*  NEUTROABS 3.2  --   --   HGB 8.6* 7.9* 7.1*  HCT 26.6* 24.2* 22.6*  MCV 85.3 87.4 87.9  PLT 76* 42* 31*   Basic Metabolic Panel: Recent Labs  Lab 05/22/19 0922 05/23/19 1900 05/24/19 0407  NA 143 143 143  K 3.6 3.9 3.8  CL 108 109 111  CO2 26 28 27   GLUCOSE 110* 90 78  BUN 21* 27* 26*  CREATININE 1.50* 1.42* 1.33*  CALCIUM 8.3* 8.4* 8.0*  MG 1.8  --  1.7  PHOS  --   --  3.2   GFR: Estimated Creatinine Clearance: 65 mL/min (A) (by C-G formula based on SCr of 1.33 mg/dL (H)). Liver Function Tests: Recent Labs  Lab 05/22/19 0922 05/23/19 1900 05/24/19 0407  AST 20 18 17   ALT 22 21 19   ALKPHOS 55 45 39  BILITOT 0.4 0.3 0.5  PROT 6.1* 6.3* 5.7*  ALBUMIN 3.0* 3.1* 2.7*   No results for input(s): LIPASE, AMYLASE in the last 168 hours. No results for input(s): AMMONIA in the last 168 hours. Coagulation Profile: Recent Labs  Lab 05/17/19 1151 05/23/19 1900  INR 1.1 1.1   Cardiac Enzymes: No results for input(s): CKTOTAL, CKMB, CKMBINDEX, TROPONINI in the last 168 hours. BNP (last 3 results) No results for input(s): PROBNP in the last 8760 hours. HbA1C: No results for input(s): HGBA1C in the last 72 hours. CBG: Recent Labs  Lab 05/23/19 2354 05/24/19 0429 05/24/19 0508 05/24/19 0751  GLUCAP 87 66* 81 86   Lipid Profile: No results for input(s): CHOL, HDL, LDLCALC, TRIG, CHOLHDL, LDLDIRECT in the last 72 hours. Thyroid Function Tests: Recent Labs    05/24/19 0407  TSH 1.988   Anemia Panel: No results for input(s):  VITAMINB12, FOLATE, FERRITIN, TIBC, IRON, RETICCTPCT in the last 72 hours. Sepsis Labs: Recent Labs  Lab 05/23/19 2055 05/24/19 0021  PROCALCITON  --  0.21  LATICACIDVEN 0.8  --     Recent Results (from the past 240 hour(s))  SARS Coronavirus 2 (CEPHEID - Performed in [facility] hospital lab), Hosp Order     Status: None   Collection Time: 05/17/19 11:14 AM   Specimen: Nasopharyngeal Swab  Result Value Ref Range Status   SARS Coronavirus 2 NEGATIVE NEGATIVE Final    Comment: (NOTE) If result is NEGATIVE SARS-CoV-2 target nucleic acids are NOT DETECTED. The SARS-CoV-2 RNA is generally detectable in upper and lower  respiratory specimens  during the acute phase of infection. The lowest  concentration of SARS-CoV-2 viral copies this assay can detect is 250  copies / mL. A negative result does not preclude SARS-CoV-2 infection  and should not be used as the sole basis for treatment or other  patient management decisions.  A negative result may occur with  improper specimen collection / handling, submission of specimen other  than nasopharyngeal swab, presence of viral mutation(s) within the  areas targeted by this assay, and inadequate number of viral copies  (<250 copies / mL). A negative result must be combined with clinical  observations, patient history, and epidemiological information. If result is POSITIVE SARS-CoV-2 target nucleic acids are DETECTED. The SARS-CoV-2 RNA is generally detectable in upper and lower  respiratory specimens dur ing the acute phase of infection.  Positive  results are indicative of active infection with SARS-CoV-2.  Clinical  correlation with patient history and other diagnostic information is  necessary to determine patient infection status.  Positive results do  not rule out bacterial infection or co-infection with other viruses. If result is PRESUMPTIVE POSTIVE SARS-CoV-2 nucleic acids MAY BE PRESENT.   A presumptive positive result was  obtained on the submitted specimen  and confirmed on repeat testing.  While 2019 novel coronavirus  (SARS-CoV-2) nucleic acids may be present in the submitted sample  additional confirmatory testing may be necessary for epidemiological  and / or clinical management purposes  to differentiate between  SARS-CoV-2 and other Sarbecovirus currently known to infect humans.  If clinically indicated additional testing with an alternate test  methodology [phone]) is advised. The SARS-CoV-2 RNA is generally  detectable in upper and lower respiratory sp ecimens during the acute  phase of infection. The expected result is Negative. Fact Sheet for Patients:  BoilerBrush.com.cy Fact Sheet for Healthcare Providers: https://pope.com/ This test is not yet approved or cleared by the [place] FDA and has been authorized for detection and/or diagnosis of SARS-CoV-2 by FDA under an Emergency Use Authorization (EUA).  This EUA will remain in effect (meaning this test can be used) for the duration of the COVID-19 declaration under Section 564(b)(1) of the Act, 21 U.S.C. section 360bbb-3(b)(1), unless the authorization is terminated or revoked sooner. Performed at [facility], [address], [place], [place] [address]   SARS Coronavirus 2 (CEPHEID- Performed in [facility] hospital lab), Hosp Order     Status: None   Collection Time: 05/23/19  6:59 PM   Specimen: Nasopharyngeal Swab  Result Value Ref Range Status   SARS Coronavirus 2 NEGATIVE NEGATIVE Final    Comment: (NOTE) If result is NEGATIVE SARS-CoV-2 target nucleic acids are NOT DETECTED. The SARS-CoV-2 RNA is generally detectable in upper and lower  respiratory specimens during the acute phase of infection. The lowest  concentration of SARS-CoV-2 viral copies this assay can detect is 250  copies / mL. A negative result does not preclude SARS-CoV-2 infection  and should not be used as  the sole basis for treatment or other  patient management decisions.  A negative result may occur with  improper specimen collection / handling, submission of specimen other  than nasopharyngeal swab, presence of viral mutation(s) within the  areas targeted by this assay, and inadequate number of viral copies  (<250 copies / mL). A negative result must be combined with clinical  observations, patient history, and epidemiological information. If result is POSITIVE SARS-CoV-2 target nucleic acids are DETECTED. The SARS-CoV-2 RNA is generally detectable in upper and  lower  respiratory specimens dur ing the acute phase of infection.  Positive  results are indicative of active infection with SARS-CoV-2.  Clinical  correlation with patient history and other diagnostic information is  necessary to determine patient infection status.  Positive results do  not rule out bacterial infection or co-infection with other viruses. If result is PRESUMPTIVE POSTIVE SARS-CoV-2 nucleic acids MAY BE PRESENT.   A presumptive positive result was obtained on the submitted specimen  and confirmed on repeat testing.  While 2019 novel coronavirus  (SARS-CoV-2) nucleic acids may be present in the submitted sample  additional confirmatory testing may be necessary for epidemiological  and / or clinical management purposes  to differentiate between  SARS-CoV-2 and other Sarbecovirus currently known to infect humans.  If clinically indicated additional testing with an alternate test  methodology [phone]) is advised. The SARS-CoV-2 RNA is generally  detectable in upper and lower respiratory sp ecimens during the acute  phase of infection. The expected result is Negative. Fact Sheet for Patients:  BoilerBrush.com.cy Fact Sheet for Healthcare Providers: https://pope.com/ This test is not yet approved or cleared by the [place] FDA and has been authorized for  detection and/or diagnosis of SARS-CoV-2 by FDA under an Emergency Use Authorization (EUA).  This EUA will remain in effect (meaning this test can be used) for the duration of the COVID-19 declaration under Section 564(b)(1) of the Act, 21 U.S.C. section 360bbb-3(b)(1), unless the authorization is terminated or revoked sooner. Performed at [facility], [address], [place], [place] [address]   Culture, blood (Routine x 2)     Status: None (Preliminary result)   Collection Time: 05/23/19  7:00 PM   Specimen: BLOOD  Result Value Ref Range Status   Specimen Description   Final    BLOOD PORTA CATH Performed at [facility], [address], [place], [place] [address]    Special Requests   Final    BOTTLES DRAWN AEROBIC AND ANAEROBIC Blood Culture adequate volume Performed at [facility], [address], [place], [place] [address]    Culture   Final    NO GROWTH < 12 HOURS Performed at [facility], [address] [place], [place] [address]    Report Status PENDING  Incomplete  Culture, blood (Routine x 2)     Status: None (Preliminary result)   Collection Time: 05/23/19  8:56 PM   Specimen: BLOOD  Result Value Ref Range Status   Specimen Description   Final    BLOOD BLOOD LEFT WRIST Performed at [facility], [address] [place], [place] [address]    Special Requests   Final    BOTTLES DRAWN AEROBIC AND ANAEROBIC Blood Culture adequate volume Performed at [facility], [address], [place], [place] [address]    Culture   Final    NO GROWTH < 12 HOURS Performed at [facility], [address] [place], [place] [address]    Report Status PENDING  Incomplete       Radiology Studies: Dg Chest Portable 1 View  Result Date: 05/23/2019 CLINICAL DATA:  Fever and weakness EXAM: PORTABLE CHEST 1 VIEW COMPARISON:  05/03/2019 FINDINGS: Cardiac shadow is stable.  Left-sided chest wall port is again noted and stable. The lungs are well aerated without focal infiltrate or sizable effusion. Some scarring is noted in the right upper lobe. No focal infiltrate is seen. No effusion is noted. IMPRESSION: No  acute abnormality seen. Electronically Signed   By: Alcide Clever M.D.   On: 05/23/2019 18:54      Scheduled Meds: . feeding supplement (ENSURE ENLIVE)  237 mL Oral BID BM  . insulin aspart  0-9 Units Subcutaneous TID WC  . mirtazapine  30 mg Oral QHS  . nebivolol  5 mg Oral Daily  . pantoprazole  40 mg Oral Daily  . ranolazine  1,000 mg Oral BID  . rosuvastatin  20 mg Oral Daily  . sodium chloride flush  10-40 mL Intracatheter Q12H  . sucralfate  1 g Oral TID AC & HS   Continuous Infusions: . ceFEPime (MAXIPIME) IV Stopped (05/24/19 0542)  . metronidazole 500 mg (05/24/19 0948)  . vancomycin       LOS: 0 days    Time spent: 35 minutes   Noralee Stain, DO [facility] [email or web address] 05/24/2019, 11:12 AM

## 2019-05-25 LAB — BASIC METABOLIC PANEL
Anion gap: 6 (ref 5–15)
BUN: 22 mg/dL — ABNORMAL HIGH (ref 6–20)
CO2: 26 mmol/L (ref 22–32)
Calcium: 8.2 mg/dL — ABNORMAL LOW (ref 8.9–10.3)
Chloride: 111 mmol/L (ref 98–111)
Creatinine, Ser: 1.32 mg/dL — ABNORMAL HIGH (ref 0.61–1.24)
GFR calc Af Amer: >60 mL/min (ref >60)
GFR calc non Af Amer: >60 mL/min (ref >60)
Glucose, Bld: 75 mg/dL (ref 70–99)
Potassium: 3.8 mmol/L (ref 3.5–5.1)
Sodium: 143 mmol/L (ref 135–145)

## 2019-05-25 LAB — CBC WITH DIFFERENTIAL/PLATELET
HCT: 23.3 % — ABNORMAL LOW (ref 39.0–52.0)
Hemoglobin: 7.4 g/dL — ABNORMAL LOW (ref 13.0–17.0)
MCH: 28 pg (ref 26.0–34.0)
MCHC: 31.8 g/dL (ref 30.0–36.0)
MCV: 88.3 fL (ref 80.0–100.0)
Platelets: 17 10*3/uL — CL (ref 150–400)
RBC: 2.64 MIL/uL — ABNORMAL LOW (ref 4.22–5.81)
RDW: 13.3 % (ref 11.5–15.5)
WBC: 0.1 10*3/uL — CL (ref 4.0–10.5)
nRBC: 0 % (ref 0.0–0.2)

## 2019-05-25 LAB — GLUCOSE, CAPILLARY
Glucose-Capillary: 72 mg/dL (ref 70–99)
Glucose-Capillary: 128 mg/dL — ABNORMAL HIGH (ref 70–99)
Glucose-Capillary: 99 mg/dL (ref 70–99)
Glucose-Capillary: 93 mg/dL (ref 70–99)
Glucose-Capillary: 71 mg/dL (ref 70–99)

## 2019-05-25 NOTE — Progress Notes (Signed)
CRITICAL VALUE ALERT  Critical Value:  PLTs 17   Date & Time Notied:  05/25/2019 0605   Provider Notified: Kennon Holter NP   Orders Received/Actions taken: On call notified

## 2019-05-25 NOTE — Progress Notes (Signed)
PROGRESS NOTE    Andrew West  ZOX:[id number] DOB: Jul 07, 1966 DOA: 05/23/2019 PCP: [name], MD     Brief Narrative:  Andrew West is a 53 year old male with past medical history significant for small cell lung cancer undergoing chemotherapy, CAD, type 2 diabetes, hypertension, OSA, COPD who presents with fever, shortness of breath and hypotension.  His most recent chemotherapy treatment was 6/22.  He was noted to have fever up to 101 and presented to the emergency department.  He was admitted for further treatment and evaluation of neutropenic fever.  He was started on broad-spectrum antibiotics including vancomycin, cefepime, Flagyl  New events last 24 hours / Subjective: Feeling well, continues to have some nasal sniffles.  He states that he probably got a cold from his wife.  Otherwise feeling great, wants to go home.  Afebrile overnight  Assessment & Plan:   Principal Problem:   Neutropenic fever (HCC) Active Problems:   Diabetes mellitus (HCC)   HLD (hyperlipidemia)   Hereditary and idiopathic peripheral neuropathy   GERD   COPD with asthma (HCC)   Hypertension   CAD S/P multiple PCI's   Diastolic dysfunction-grade 2 with EF 60-65% Oct 2016   Benign prostatic hyperplasia with urinary hesitancy   Type 2 diabetes mellitus (HCC)   Sleep apnea   CKD (chronic kidney disease) stage 3, GFR 30-59 ml/min (HCC)   Bone metastases (HCC)    Sepsis secondary to neutropenic fever -Presented with fever 101.4, HR 99, WBC 0.1 -Lactic acid 0.8, procalcitonin 0.21 -Blood cultures negative growth to date -Chest x-ray negative for acute infiltrate -Urinalysis unremarkable for acute infection -SARS-CoV-2 negative -Empiric vancomycin, cefepime, Flagyl  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 -Oncology consulted  Pancytopenia -Patient has already received Neulasta, no need for additional growth factor support -Transfuse for hemoglobin less than 7, platelet less than  10, or active bleeding -Continue to monitor CBC  CAD -Continue ranexa, Bystolic  CKD stage III -Baseline creatinine 1.4-1.5 -Stable  Hyperlipidemia -Continue crestor   Type 2 diabetes -SSI   Depression -Continue Cymbalta   DVT prophylaxis: SCD Code Status: Full code Family Communication: None Disposition Plan: Monitor 1 more day on IV antibiotics.  If patient has improved and cultures remain negative, will plan for discharge home on empiric antibiotics 7/4   Consultants:   Oncology  Procedures:   None  Antimicrobials:  Anti-infectives (From admission, onward)   Start     Dose/Rate Route Frequency Ordered Stop   05/24/19 2200  vancomycin (VANCOCIN) 1,500 mg in sodium chloride 0.9 % 500 mL IVPB     1,500 mg 250 mL/hr over 120 Minutes Intravenous Every 24 hours 05/24/19 0336     05/24/19 0600  ceFEPIme (MAXIPIME) 2 g in sodium chloride 0.9 % 100 mL IVPB     2 g 200 mL/hr over 30 Minutes Intravenous Every 8 hours 05/24/19 0336     05/23/19 2300  metroNIDAZOLE (FLAGYL) IVPB 500 mg     500 mg 100 mL/hr over 60 Minutes Intravenous Every 8 hours 05/23/19 2252     05/23/19 2100  vancomycin (VANCOCIN) 1,500 mg in sodium chloride 0.9 % 500 mL IVPB     1,500 mg 250 mL/hr over 120 Minutes Intravenous  Once 05/23/19 2051 05/24/19 0020   05/23/19 2100  ceFEPIme (MAXIPIME) 2 g in sodium chloride 0.9 % 100 mL IVPB     2 g 200 mL/hr over 30 Minutes Intravenous  Once 05/23/19 2051 05/23/19 2142  Objective: Vitals:   05/24/19 1856 05/24/19 2102 05/25/19 0500 05/25/19 0527  BP: 109/82 107/74  112/81  Pulse: 82 76  70  Resp: 18 16  18   Temp: 97.8 F (36.6 C) 98.3 F (36.8 C)  98.2 F (36.8 C)  TempSrc: Oral Oral  Oral  SpO2: 99% 99%  97%  Weight:   73 kg   Height:        Intake/Output Summary (Last 24 hours) at 05/25/2019 1041 Last data filed at 05/25/2019 0836 Gross per 24 hour  Intake 1166.17 ml  Output 1000 ml  Net 166.17 ml   Filed Weights   05/23/19  1733 05/23/19 1849 05/25/19 0500  Weight: 68 kg 72.6 kg 73 kg    Examination: General exam: Appears calm and comfortable  Respiratory system: Clear to auscultation. Respiratory effort normal. Cardiovascular system: S1 & S2 heard, RRR. No JVD, murmurs, rubs, gallops or clicks. No pedal edema. Gastrointestinal system: Abdomen is nondistended, soft and nontender. No organomegaly or masses felt. Normal bowel sounds heard. Central nervous system: Alert and oriented. No focal neurological deficits. Extremities: Symmetric 5 x 5 power. Skin: No rashes, lesions or ulcers Psychiatry: Judgement and insight appear normal. Mood & affect appropriate.   Data Reviewed: I have personally reviewed following labs and imaging studies  CBC: Recent Labs  Lab 05/22/19 0922 05/23/19 1900 05/24/19 0407 05/25/19 0510  WBC 3.6* 0.1* 0.1* 0.1*  NEUTROABS 3.2  --   --   --   HGB 8.6* 7.9* 7.1* 7.4*  HCT 26.6* 24.2* 22.6* 23.3*  MCV 85.3 87.4 87.9 88.3  PLT 76* 42* 31* 17*   Basic Metabolic Panel: Recent Labs  Lab 05/22/19 0922 05/23/19 1900 05/24/19 0407 05/25/19 0510  NA 143 143 143 143  K 3.6 3.9 3.8 3.8  CL 108 109 111 111  CO2 26 28 27 26   GLUCOSE 110* 90 78 75  BUN 21* 27* 26* 22*  CREATININE 1.50* 1.42* 1.33* 1.32*  CALCIUM 8.3* 8.4* 8.0* 8.2*  MG 1.8  --  1.7  --   PHOS  --   --  3.2  --    GFR: Estimated Creatinine Clearance: 65.5 mL/min (A) (by C-G formula based on SCr of 1.32 mg/dL (H)). Liver Function Tests: Recent Labs  Lab 05/22/19 0922 05/23/19 1900 05/24/19 0407  AST 20 18 17   ALT 22 21 19   ALKPHOS 55 45 39  BILITOT 0.4 0.3 0.5  PROT 6.1* 6.3* 5.7*  ALBUMIN 3.0* 3.1* 2.7*   No results for input(s): LIPASE, AMYLASE in the last 168 hours. No results for input(s): AMMONIA in the last 168 hours. Coagulation Profile: Recent Labs  Lab 05/23/19 1900  INR 1.1   Cardiac Enzymes: No results for input(s): CKTOTAL, CKMB, CKMBINDEX, TROPONINI in the last 168 hours. BNP  (last 3 results) No results for input(s): PROBNP in the last 8760 hours. HbA1C: No results for input(s): HGBA1C in the last 72 hours. CBG: Recent Labs  Lab 05/24/19 1700 05/24/19 2101 05/24/19 2330 05/25/19 0526 05/25/19 0752  GLUCAP 77 101* 85 72 128*   Lipid Profile: No results for input(s): CHOL, HDL, LDLCALC, TRIG, CHOLHDL, LDLDIRECT in the last 72 hours. Thyroid Function Tests: Recent Labs    05/24/19 0407  TSH 1.988   Anemia Panel: No results for input(s): VITAMINB12, FOLATE, FERRITIN, TIBC, IRON, RETICCTPCT in the last 72 hours. Sepsis Labs: Recent Labs  Lab 05/23/19 2055 05/24/19 0021  PROCALCITON  --  0.21  LATICACIDVEN 0.8  --  Recent Results (from the past 240 hour(s))  SARS Coronavirus 2 (CEPHEID - Performed in [facility] hospital lab), Hosp Order     Status: None   Collection Time: 05/17/19 11:14 AM   Specimen: Nasopharyngeal Swab  Result Value Ref Range Status   SARS Coronavirus 2 NEGATIVE NEGATIVE Final    Comment: (NOTE) If result is NEGATIVE SARS-CoV-2 target nucleic acids are NOT DETECTED. The SARS-CoV-2 RNA is generally detectable in upper and lower  respiratory specimens during the acute phase of infection. The lowest  concentration of SARS-CoV-2 viral copies this assay can detect is 250  copies / mL. A negative result does not preclude SARS-CoV-2 infection  and should not be used as the sole basis for treatment or other  patient management decisions.  A negative result may occur with  improper specimen collection / handling, submission of specimen other  than nasopharyngeal swab, presence of viral mutation(s) within the  areas targeted by this assay, and inadequate number of viral copies  (<250 copies / mL). A negative result must be combined with clinical  observations, patient history, and epidemiological information. If result is POSITIVE SARS-CoV-2 target nucleic acids are DETECTED. The SARS-CoV-2 RNA is generally detectable in upper  and lower  respiratory specimens dur ing the acute phase of infection.  Positive  results are indicative of active infection with SARS-CoV-2.  Clinical  correlation with patient history and other diagnostic information is  necessary to determine patient infection status.  Positive results do  not rule out bacterial infection or co-infection with other viruses. If result is PRESUMPTIVE POSTIVE SARS-CoV-2 nucleic acids MAY BE PRESENT.   A presumptive positive result was obtained on the submitted specimen  and confirmed on repeat testing.  While 2019 novel coronavirus  (SARS-CoV-2) nucleic acids may be present in the submitted sample  additional confirmatory testing may be necessary for epidemiological  and / or clinical management purposes  to differentiate between  SARS-CoV-2 and other Sarbecovirus currently known to infect humans.  If clinically indicated additional testing with an alternate test  methodology [phone]) is advised. The SARS-CoV-2 RNA is generally  detectable in upper and lower respiratory sp ecimens during the acute  phase of infection. The expected result is Negative. Fact Sheet for Patients:  BoilerBrush.com.cy Fact Sheet for Healthcare Providers: https://pope.com/ This test is not yet approved or cleared by the [place] FDA and has been authorized for detection and/or diagnosis of SARS-CoV-2 by FDA under an Emergency Use Authorization (EUA).  This EUA will remain in effect (meaning this test can be used) for the duration of the COVID-19 declaration under Section 564(b)(1) of the Act, 21 U.S.C. section 360bbb-3(b)(1), unless the authorization is terminated or revoked sooner. Performed at [facility], [address], [place], [place] [address]   SARS Coronavirus 2 (CEPHEID- Performed in [facility] hospital lab), Hosp Order     Status: None   Collection Time: 05/23/19  6:59 PM   Specimen: Nasopharyngeal  Swab  Result Value Ref Range Status   SARS Coronavirus 2 NEGATIVE NEGATIVE Final    Comment: (NOTE) If result is NEGATIVE SARS-CoV-2 target nucleic acids are NOT DETECTED. The SARS-CoV-2 RNA is generally detectable in upper and lower  respiratory specimens during the acute phase of infection. The lowest  concentration of SARS-CoV-2 viral copies this assay can detect is 250  copies / mL. A negative result does not preclude SARS-CoV-2 infection  and should not be used as the sole basis for treatment or other  patient management decisions.  A negative result may occur with  improper specimen collection / handling, submission of specimen other  than nasopharyngeal swab, presence of viral mutation(s) within the  areas targeted by this assay, and inadequate number of viral copies  (<250 copies / mL). A negative result must be combined with clinical  observations, patient history, and epidemiological information. If result is POSITIVE SARS-CoV-2 target nucleic acids are DETECTED. The SARS-CoV-2 RNA is generally detectable in upper and lower  respiratory specimens dur ing the acute phase of infection.  Positive  results are indicative of active infection with SARS-CoV-2.  Clinical  correlation with patient history and other diagnostic information is  necessary to determine patient infection status.  Positive results do  not rule out bacterial infection or co-infection with other viruses. If result is PRESUMPTIVE POSTIVE SARS-CoV-2 nucleic acids MAY BE PRESENT.   A presumptive positive result was obtained on the submitted specimen  and confirmed on repeat testing.  While 2019 novel coronavirus  (SARS-CoV-2) nucleic acids may be present in the submitted sample  additional confirmatory testing may be necessary for epidemiological  and / or clinical management purposes  to differentiate between  SARS-CoV-2 and other Sarbecovirus currently known to infect humans.  If clinically indicated  additional testing with an alternate test  methodology [phone]) is advised. The SARS-CoV-2 RNA is generally  detectable in upper and lower respiratory sp ecimens during the acute  phase of infection. The expected result is Negative. Fact Sheet for Patients:  BoilerBrush.com.cy Fact Sheet for Healthcare Providers: https://pope.com/ This test is not yet approved or cleared by the [place] FDA and has been authorized for detection and/or diagnosis of SARS-CoV-2 by FDA under an Emergency Use Authorization (EUA).  This EUA will remain in effect (meaning this test can be used) for the duration of the COVID-19 declaration under Section 564(b)(1) of the Act, 21 U.S.C. section 360bbb-3(b)(1), unless the authorization is terminated or revoked sooner. Performed at [facility], [address], [place], [place] [address]   Culture, blood (Routine x 2)     Status: None (Preliminary result)   Collection Time: 05/23/19  7:00 PM   Specimen: BLOOD  Result Value Ref Range Status   Specimen Description   Final    BLOOD PORTA CATH Performed at [facility], [address] [place], [place] [address]    Special Requests   Final    BOTTLES DRAWN AEROBIC AND ANAEROBIC Blood Culture adequate volume Performed at [facility], [address] [place], [place] [address]    Culture   Final    NO GROWTH 2 DAYS Performed at [facility], [address] [place], [place] [address]    Report Status PENDING  Incomplete  Culture, blood (Routine x 2)     Status: None (Preliminary result)   Collection Time: 05/23/19  8:56 PM   Specimen: BLOOD  Result Value Ref Range Status   Specimen Description   Final    BLOOD BLOOD LEFT WRIST Performed at [facility], [address] [place], [place] [address]    Special Requests   Final    BOTTLES DRAWN AEROBIC AND ANAEROBIC Blood  Culture adequate volume Performed at [facility], [address], [place], [place] [address]    Culture   Final    NO GROWTH 2 DAYS Performed at [facility], [address], [place], [place] [address]    Report Status  PENDING  Incomplete       Radiology Studies: Dg Chest Portable 1 View  Result Date: 05/23/2019 CLINICAL DATA:  Fever and weakness EXAM: PORTABLE CHEST 1 VIEW COMPARISON:  05/03/2019 FINDINGS: Cardiac shadow is stable. Left-sided chest wall port is again noted and stable. The lungs are well aerated without focal infiltrate or sizable effusion. Some scarring is noted in the right upper lobe. No focal infiltrate is seen. No effusion is noted. IMPRESSION: No acute abnormality seen. Electronically Signed   By: Alcide Clever M.D.   On: 05/23/2019 18:54      Scheduled Meds: . feeding supplement (ENSURE ENLIVE)  237 mL Oral BID BM  . feeding supplement (OSMOLITE 1.5 CAL)  237 mL Per Tube TID  . feeding supplement (PRO-STAT SUGAR FREE 64)  30 mL Per Tube TID  . insulin aspart  0-9 Units Subcutaneous TID WC  . mirtazapine  30 mg Oral QHS  . nebivolol  5 mg Oral Daily  . pantoprazole  40 mg Oral Daily  . ranolazine  1,000 mg Oral BID  . rosuvastatin  20 mg Oral Daily  . sodium chloride flush  10-40 mL Intracatheter Q12H  . sucralfate  1 g Oral TID AC & HS   Continuous Infusions: . ceFEPime (MAXIPIME) IV 2 g (05/25/19 0523)  . metronidazole 500 mg (05/25/19 0813)  . vancomycin 1,500 mg (05/24/19 2211)     LOS: 1 day    Time spent: 25 minutes   Noralee Stain, DO [facility] [email or web address] 05/25/2019, 10:41 AM

## 2019-05-26 LAB — GLUCOSE, CAPILLARY
Glucose-Capillary: 70 mg/dL (ref 70–99)
Glucose-Capillary: 83 mg/dL (ref 70–99)
Glucose-Capillary: 64 mg/dL — ABNORMAL LOW (ref 70–99)
Glucose-Capillary: 85 mg/dL (ref 70–99)
Glucose-Capillary: 78 mg/dL (ref 70–99)
Glucose-Capillary: 68 mg/dL — ABNORMAL LOW (ref 70–99)

## 2019-05-26 LAB — BASIC METABOLIC PANEL
Anion gap: 6 (ref 5–15)
BUN: 23 mg/dL — ABNORMAL HIGH (ref 6–20)
CO2: 25 mmol/L (ref 22–32)
Calcium: 8 mg/dL — ABNORMAL LOW (ref 8.9–10.3)
Chloride: 111 mmol/L (ref 98–111)
Creatinine, Ser: 1.37 mg/dL — ABNORMAL HIGH (ref 0.61–1.24)
GFR calc Af Amer: >60 mL/min (ref >60)
GFR calc non Af Amer: 59 mL/min — ABNORMAL LOW (ref >60)
Glucose, Bld: 72 mg/dL (ref 70–99)
Potassium: 3.6 mmol/L (ref 3.5–5.1)
Sodium: 142 mmol/L (ref 135–145)

## 2019-05-26 LAB — CBC WITH DIFFERENTIAL/PLATELET
HCT: 20.9 % — ABNORMAL LOW (ref 39.0–52.0)
Hemoglobin: 6.7 g/dL — CL (ref 13.0–17.0)
MCH: 28.2 pg (ref 26.0–34.0)
MCHC: 32.1 g/dL (ref 30.0–36.0)
MCV: 87.8 fL (ref 80.0–100.0)
Platelets: 8 10*3/uL — CL (ref 150–400)
RBC: 2.38 MIL/uL — ABNORMAL LOW (ref 4.22–5.81)
RDW: 13.3 % (ref 11.5–15.5)
WBC: 0.2 10*3/uL — CL (ref 4.0–10.5)
nRBC: 0 % (ref 0.0–0.2)

## 2019-05-26 LAB — PREPARE RBC (CROSSMATCH)

## 2019-05-26 MED ORDER — SODIUM CHLORIDE 0.9% IV SOLUTION
Freq: Once | INTRAVENOUS | Status: AC
  Administered 2019-05-26: 06:00:00 via INTRAVENOUS

## 2019-05-26 NOTE — Progress Notes (Signed)
PROGRESS NOTE    Andrew West  QQV:[id number] DOB: 11-Apr-1966 DOA: 05/23/2019 PCP: [name], MD     Brief Narrative:  Andrew West is a 53 year old male with past medical history significant for small cell lung cancer undergoing chemotherapy, CAD, type 2 diabetes, hypertension, OSA, COPD who presents with fever, shortness of breath and hypotension.  His most recent chemotherapy treatment was 6/22.  He was noted to have fever up to 101 and presented to the emergency department.  He was admitted for further treatment and evaluation of neutropenic fever.  He was started on broad-spectrum antibiotics including vancomycin, cefepime, Flagyl  New events last 24 hours / Subjective: No new issues, afebrile overnight, denies any bleeding.  Disappointed that he cannot go home today.  No complaints of dizziness or lightheadedness.  Assessment & Plan:   Principal Problem:   Neutropenic fever (HCC) Active Problems:   Diabetes mellitus (HCC)   HLD (hyperlipidemia)   Hereditary and idiopathic peripheral neuropathy   GERD   COPD with asthma (HCC)   Hypertension   CAD S/P multiple PCI's   Diastolic dysfunction-grade 2 with EF 60-65% Oct 2016   Benign prostatic hyperplasia with urinary hesitancy   Type 2 diabetes mellitus (HCC)   Sleep apnea   CKD (chronic kidney disease) stage 3, GFR 30-59 ml/min (HCC)   Bone metastases (HCC)    Sepsis secondary to neutropenic fever -Presented with fever 101.4, HR 99, WBC 0.1 -Lactic acid 0.8, procalcitonin 0.21 -Blood cultures negative growth to date -Chest x-ray negative for acute infiltrate -Urinalysis unremarkable for acute infection -SARS-CoV-2 negative -Empiric vancomycin, cefepime, Flagyl.  No source found.  De-escalate to cefepime only and monitor for fevers  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 -Oncology consulted  Pancytopenia -Patient has already received Neulasta, no need for additional growth factor support -Transfuse  for hemoglobin less than 7, platelet less than 10, or active bleeding -Continue to monitor CBC -Ordered for packed red blood cell and platelet transfusion today -No evidence of bleeding noted  CAD -Continue ranexa, Bystolic  CKD stage III -Baseline creatinine 1.4-1.5 -Stable  Hyperlipidemia -Continue crestor   Type 2 diabetes -SSI   Depression -Continue Cymbalta   DVT prophylaxis: SCD Code Status: Full code Family Communication: None Disposition Plan: Transfuse packed red blood cell and platelet today.  Plan for discharge home tomorrow if blood counts stable, afebrile with de-escalation of his antibiotics   Consultants:   Oncology  Procedures:   None  Antimicrobials:  Anti-infectives (From admission, onward)   Start     Dose/Rate Route Frequency Ordered Stop   05/24/19 2200  vancomycin (VANCOCIN) 1,500 mg in sodium chloride 0.9 % 500 mL IVPB     1,500 mg 250 mL/hr over 120 Minutes Intravenous Every 24 hours 05/24/19 0336     05/24/19 0600  ceFEPIme (MAXIPIME) 2 g in sodium chloride 0.9 % 100 mL IVPB     2 g 200 mL/hr over 30 Minutes Intravenous Every 8 hours 05/24/19 0336     05/23/19 2300  metroNIDAZOLE (FLAGYL) IVPB 500 mg     500 mg 100 mL/hr over 60 Minutes Intravenous Every 8 hours 05/23/19 2252     05/23/19 2100  vancomycin (VANCOCIN) 1,500 mg in sodium chloride 0.9 % 500 mL IVPB     1,500 mg 250 mL/hr over 120 Minutes Intravenous  Once 05/23/19 2051 05/24/19 0020   05/23/19 2100  ceFEPIme (MAXIPIME) 2 g in sodium chloride 0.9 % 100 mL IVPB  2 g 200 mL/hr over 30 Minutes Intravenous  Once 05/23/19 2051 05/23/19 2142       Objective: Vitals:   05/25/19 1959 05/26/19 0534 05/26/19 0812 05/26/19 0832  BP: 112/82 116/83 109/74 98/67  Pulse: 78 75 81 79  Resp: 18 18 13 14   Temp: 98.2 F (36.8 C) 99.1 F (37.3 C) 98.6 F (37 C) 98.9 F (37.2 C)  TempSrc: Oral Oral Oral Oral  SpO2: 100% 97% 99% 98%  Weight:      Height:        Intake/Output  Summary (Last 24 hours) at 05/26/2019 1049 Last data filed at 05/26/2019 0828 Gross per 24 hour  Intake 1557 ml  Output 1000 ml  Net 557 ml   Filed Weights   05/23/19 1733 05/23/19 1849 05/25/19 0500  Weight: 68 kg 72.6 kg 73 kg   Examination: General exam: Appears calm and comfortable  Respiratory system: Clear to auscultation. Respiratory effort normal. Cardiovascular system: S1 & S2 heard, RRR. No JVD, murmurs, rubs, gallops or clicks. No pedal edema. Gastrointestinal system: Abdomen is nondistended, soft and nontender. No organomegaly or masses felt. Normal bowel sounds heard. Central nervous system: Alert and oriented. No focal neurological deficits. Extremities: Symmetric 5 x 5 power. Skin: No rashes, lesions or ulcers Psychiatry: Judgement and insight appear normal. Mood & affect appropriate.    Data Reviewed: I have personally reviewed following labs and imaging studies  CBC: Recent Labs  Lab 05/22/19 0922 05/23/19 1900 05/24/19 0407 05/25/19 0510 05/26/19 0333  WBC 3.6* 0.1* 0.1* 0.1* 0.2*  NEUTROABS 3.2  --   --   --   --   HGB 8.6* 7.9* 7.1* 7.4* 6.7*  HCT 26.6* 24.2* 22.6* 23.3* 20.9*  MCV 85.3 87.4 87.9 88.3 87.8  PLT 76* 42* 31* 17* 8*   Basic Metabolic Panel: Recent Labs  Lab 05/22/19 0922 05/23/19 1900 05/24/19 0407 05/25/19 0510 05/26/19 0333  NA 143 143 143 143 142  K 3.6 3.9 3.8 3.8 3.6  CL 108 109 111 111 111  CO2 26 28 27 26 25   GLUCOSE 110* 90 78 75 72  BUN 21* 27* 26* 22* 23*  CREATININE 1.50* 1.42* 1.33* 1.32* 1.37*  CALCIUM 8.3* 8.4* 8.0* 8.2* 8.0*  MG 1.8  --  1.7  --   --   PHOS  --   --  3.2  --   --    GFR: Estimated Creatinine Clearance: 63.1 mL/min (A) (by C-G formula based on SCr of 1.37 mg/dL (H)). Liver Function Tests: Recent Labs  Lab 05/22/19 0922 05/23/19 1900 05/24/19 0407  AST 20 18 17   ALT 22 21 19   ALKPHOS 55 45 39  BILITOT 0.4 0.3 0.5  PROT 6.1* 6.3* 5.7*  ALBUMIN 3.0* 3.1* 2.7*   No results for input(s):  LIPASE, AMYLASE in the last 168 hours. No results for input(s): AMMONIA in the last 168 hours. Coagulation Profile: Recent Labs  Lab 05/23/19 1900  INR 1.1   Cardiac Enzymes: No results for input(s): CKTOTAL, CKMB, CKMBINDEX, TROPONINI in the last 168 hours. BNP (last 3 results) No results for input(s): PROBNP in the last 8760 hours. HbA1C: No results for input(s): HGBA1C in the last 72 hours. CBG: Recent Labs  Lab 05/25/19 1151 05/25/19 1659 05/25/19 2141 05/26/19 0536 05/26/19 0733  GLUCAP 99 93 71 70 83   Lipid Profile: No results for input(s): CHOL, HDL, LDLCALC, TRIG, CHOLHDL, LDLDIRECT in the last 72 hours. Thyroid Function Tests: Recent Labs  05/24/19 0407  TSH 1.988   Anemia Panel: No results for input(s): VITAMINB12, FOLATE, FERRITIN, TIBC, IRON, RETICCTPCT in the last 72 hours. Sepsis Labs: Recent Labs  Lab 05/23/19 2055 05/24/19 0021  PROCALCITON  --  0.21  LATICACIDVEN 0.8  --     Recent Results (from the past 240 hour(s))  SARS Coronavirus 2 (CEPHEID - Performed in [facility] hospital lab), Hosp Order     Status: None   Collection Time: 05/17/19 11:14 AM   Specimen: Nasopharyngeal Swab  Result Value Ref Range Status   SARS Coronavirus 2 NEGATIVE NEGATIVE Final    Comment: (NOTE) If result is NEGATIVE SARS-CoV-2 target nucleic acids are NOT DETECTED. The SARS-CoV-2 RNA is generally detectable in upper and lower  respiratory specimens during the acute phase of infection. The lowest  concentration of SARS-CoV-2 viral copies this assay can detect is 250  copies / mL. A negative result does not preclude SARS-CoV-2 infection  and should not be used as the sole basis for treatment or other  patient management decisions.  A negative result may occur with  improper specimen collection / handling, submission of specimen other  than nasopharyngeal swab, presence of viral mutation(s) within the  areas targeted by this assay, and inadequate number of  viral copies  (<250 copies / mL). A negative result must be combined with clinical  observations, patient history, and epidemiological information. If result is POSITIVE SARS-CoV-2 target nucleic acids are DETECTED. The SARS-CoV-2 RNA is generally detectable in upper and lower  respiratory specimens dur ing the acute phase of infection.  Positive  results are indicative of active infection with SARS-CoV-2.  Clinical  correlation with patient history and other diagnostic information is  necessary to determine patient infection status.  Positive results do  not rule out bacterial infection or co-infection with other viruses. If result is PRESUMPTIVE POSTIVE SARS-CoV-2 nucleic acids MAY BE PRESENT.   A presumptive positive result was obtained on the submitted specimen  and confirmed on repeat testing.  While 2019 novel coronavirus  (SARS-CoV-2) nucleic acids may be present in the submitted sample  additional confirmatory testing may be necessary for epidemiological  and / or clinical management purposes  to differentiate between  SARS-CoV-2 and other Sarbecovirus currently known to infect humans.  If clinically indicated additional testing with an alternate test  methodology [phone]) is advised. The SARS-CoV-2 RNA is generally  detectable in upper and lower respiratory sp ecimens during the acute  phase of infection. The expected result is Negative. Fact Sheet for Patients:  BoilerBrush.com.cy Fact Sheet for Healthcare Providers: https://pope.com/ This test is not yet approved or cleared by the [place] FDA and has been authorized for detection and/or diagnosis of SARS-CoV-2 by FDA under an Emergency Use Authorization (EUA).  This EUA will remain in effect (meaning this test can be used) for the duration of the COVID-19 declaration under Section 564(b)(1) of the Act, 21 U.S.C. section 360bbb-3(b)(1), unless the authorization is  terminated or revoked sooner. Performed at [facility], [address], [place], [place] [address]   SARS Coronavirus 2 (CEPHEID- Performed in [facility] hospital lab), Hosp Order     Status: None   Collection Time: 05/23/19  6:59 PM   Specimen: Nasopharyngeal Swab  Result Value Ref Range Status   SARS Coronavirus 2 NEGATIVE NEGATIVE Final    Comment: (NOTE) If result is NEGATIVE SARS-CoV-2 target nucleic acids are NOT DETECTED. The SARS-CoV-2 RNA is generally detectable in upper and lower  respiratory specimens during the acute phase of infection. The lowest  concentration of SARS-CoV-2 viral copies this assay can detect is 250  copies / mL. A negative result does not preclude SARS-CoV-2 infection  and should not be used as the sole basis for treatment or other  patient management decisions.  A negative result may occur with  improper specimen collection / handling, submission of specimen other  than nasopharyngeal swab, presence of viral mutation(s) within the  areas targeted by this assay, and inadequate number of viral copies  (<250 copies / mL). A negative result must be combined with clinical  observations, patient history, and epidemiological information. If result is POSITIVE SARS-CoV-2 target nucleic acids are DETECTED. The SARS-CoV-2 RNA is generally detectable in upper and lower  respiratory specimens dur ing the acute phase of infection.  Positive  results are indicative of active infection with SARS-CoV-2.  Clinical  correlation with patient history and other diagnostic information is  necessary to determine patient infection status.  Positive results do  not rule out bacterial infection or co-infection with other viruses. If result is PRESUMPTIVE POSTIVE SARS-CoV-2 nucleic acids MAY BE PRESENT.   A presumptive positive result was obtained on the submitted specimen  and confirmed on repeat testing.  While 2019 novel coronavirus  (SARS-CoV-2) nucleic acids may  be present in the submitted sample  additional confirmatory testing may be necessary for epidemiological  and / or clinical management purposes  to differentiate between  SARS-CoV-2 and other Sarbecovirus currently known to infect humans.  If clinically indicated additional testing with an alternate test  methodology [phone]) is advised. The SARS-CoV-2 RNA is generally  detectable in upper and lower respiratory sp ecimens during the acute  phase of infection. The expected result is Negative. Fact Sheet for Patients:  BoilerBrush.com.cy Fact Sheet for Healthcare Providers: https://pope.com/ This test is not yet approved or cleared by the [place] FDA and has been authorized for detection and/or diagnosis of SARS-CoV-2 by FDA under an Emergency Use Authorization (EUA).  This EUA will remain in effect (meaning this test can be used) for the duration of the COVID-19 declaration under Section 564(b)(1) of the Act, 21 U.S.C. section 360bbb-3(b)(1), unless the authorization is terminated or revoked sooner. Performed at [facility], [address], [place], [place] [address]   Culture, blood (Routine x 2)     Status: None (Preliminary result)   Collection Time: 05/23/19  7:00 PM   Specimen: BLOOD  Result Value Ref Range Status   Specimen Description   Final    BLOOD PORTA CATH Performed at [facility], [address], [place], [place] [address]    Special Requests   Final    BOTTLES DRAWN AEROBIC AND ANAEROBIC Blood Culture adequate volume Performed at [facility], [address], [place], [place] [address]    Culture   Final    NO GROWTH 2 DAYS Performed at [facility], [address], [place], [place] [address]    Report Status PENDING  Incomplete  Culture, blood (Routine x 2)     Status: None (Preliminary result)   Collection Time: 05/23/19  8:56 PM   Specimen:  BLOOD  Result Value Ref Range Status   Specimen Description   Final    BLOOD BLOOD LEFT WRIST Performed at [facility], [address], [place], [place] [address]    Special Requests   Final    BOTTLES DRAWN AEROBIC AND ANAEROBIC  Blood Culture adequate volume Performed at [facility], [address], [place], [place] [address]    Culture   Final    NO GROWTH 2 DAYS Performed at [facility], [address] [place], [place] [address]    Report Status PENDING  Incomplete       Radiology Studies: No results found.    Scheduled Meds: . feeding supplement (ENSURE ENLIVE)  237 mL Oral BID BM  . feeding supplement (OSMOLITE 1.5 CAL)  237 mL Per Tube TID  . feeding supplement (PRO-STAT SUGAR FREE 64)  30 mL Per Tube TID  . insulin aspart  0-9 Units Subcutaneous TID WC  . mirtazapine  30 mg Oral QHS  . nebivolol  5 mg Oral Daily  . pantoprazole  40 mg Oral Daily  . ranolazine  1,000 mg Oral BID  . rosuvastatin  20 mg Oral Daily  . sodium chloride flush  10-40 mL Intracatheter Q12H  . sucralfate  1 g Oral TID AC & HS   Continuous Infusions: . ceFEPime (MAXIPIME) IV Stopped (05/26/19 0612)  . metronidazole 100 mL/hr at 05/26/19 0828  . vancomycin 120 mL/hr at 05/26/19 0828     LOS: 2 days    Time spent: 25 minutes   Andrew Stain, DO [facility] [email or web address] 05/26/2019, 10:49 AM

## 2019-05-26 NOTE — Progress Notes (Signed)
CRITICAL VALUE ALERT  Critical Value: WBC at 0.2, hemoglobin at 6.7, and platelets at 8  Date & Time Notied: 05/26/2019 @ 0433  Provider Notified: X. Blount via text/page  Orders Received/Actions taken: Awaiting further orders.

## 2019-05-26 NOTE — Progress Notes (Signed)
OT Cancellation Note  Patient Details Name: Andrew West MRN: [id number] DOB: [birth year]   Cancelled Treatment:    Reason Eval/Treat Not Completed: Medical issues which prohibited therapy.  Pt will be getting platelets this pm.  Plt = 8.  Will check another day  [name] 05/26/2019, 2:16 PM  Lesle Chris, OTR/L Acute Rehabilitation Services [phone] [facility] pager [phone] office 05/26/2019

## 2019-05-26 NOTE — Progress Notes (Signed)
This RN has assumed care over the pt at this time. Agree with previous nurse's assessment. Pt complains of nausea- antiemetic given.

## 2019-05-27 LAB — BPAM RBC
Blood Product Expiration Date: 202007162359
Blood Product Expiration Date: 202007182359
ISSUE DATE / TIME: 202007040806
ISSUE DATE / TIME: 202007041057
Unit Type and Rh: 6200
Unit Type and Rh: 6200

## 2019-05-27 LAB — GLUCOSE, CAPILLARY
Glucose-Capillary: 108 mg/dL — ABNORMAL HIGH (ref 70–99)
Glucose-Capillary: 85 mg/dL (ref 70–99)

## 2019-05-27 LAB — PREPARE PLATELET PHERESIS: Unit division: 0

## 2019-05-27 LAB — BASIC METABOLIC PANEL
Anion gap: 5 (ref 5–15)
BUN: 21 mg/dL — ABNORMAL HIGH (ref 6–20)
CO2: 27 mmol/L (ref 22–32)
Calcium: 8.3 mg/dL — ABNORMAL LOW (ref 8.9–10.3)
Chloride: 109 mmol/L (ref 98–111)
Creatinine, Ser: 1.4 mg/dL — ABNORMAL HIGH (ref 0.61–1.24)
GFR calc Af Amer: >60 mL/min (ref >60)
GFR calc non Af Amer: 57 mL/min — ABNORMAL LOW (ref >60)
Glucose, Bld: 71 mg/dL (ref 70–99)
Potassium: 3.5 mmol/L (ref 3.5–5.1)
Sodium: 141 mmol/L (ref 135–145)

## 2019-05-27 LAB — CBC WITH DIFFERENTIAL/PLATELET
HCT: 25 % — ABNORMAL LOW (ref 39.0–52.0)
Hemoglobin: 8.3 g/dL — ABNORMAL LOW (ref 13.0–17.0)
MCH: 28.8 pg (ref 26.0–34.0)
MCHC: 33.2 g/dL (ref 30.0–36.0)
MCV: 86.8 fL (ref 80.0–100.0)
Platelets: 23 10*3/uL — CL (ref 150–400)
RBC: 2.88 MIL/uL — ABNORMAL LOW (ref 4.22–5.81)
RDW: 13 % (ref 11.5–15.5)
WBC: 0.2 10*3/uL — CL (ref 4.0–10.5)
nRBC: 0 % (ref 0.0–0.2)

## 2019-05-27 LAB — BPAM PLATELET PHERESIS
Blood Product Expiration Date: 202007052359
ISSUE DATE / TIME: 202007041744
Unit Type and Rh: 5100

## 2019-05-27 LAB — TYPE AND SCREEN
ABO/RH(D): A POS
Antibody Screen: NEGATIVE
Unit division: 0
Unit division: 0

## 2019-05-27 MED ORDER — HEPARIN SOD (PORK) LOCK FLUSH 100 UNIT/ML IV SOLN: 500.0000 [IU] | INTRAVENOUS | Status: DC | PRN

## 2019-05-27 MED ORDER — LEVOFLOXACIN 500 MG PO TABS: 500.0000 mg | ORAL_TABLET | Freq: Every day | ORAL | 0 refills | Status: AC

## 2019-05-27 MED ORDER — HEPARIN SOD (PORK) LOCK FLUSH 100 UNIT/ML IV SOLN
500.0000 [IU] | INTRAVENOUS | Status: DC
  Filled 2019-05-27 (×2): qty 5

## 2019-05-27 MED ORDER — OXYCODONE-ACETAMINOPHEN 5-325 MG PO TABS: 1.0000 | ORAL_TABLET | Freq: Four times a day (QID) | ORAL | 0 refills | Status: AC | PRN

## 2019-05-27 NOTE — Progress Notes (Signed)
CRITICAL VALUE ALERT  Critical Value:  WBC 0.2  Date & Time Notied:  05/27/2019 0440  Provider Notified: Kennon Holter NP   Orders Received/Actions taken:

## 2019-05-27 NOTE — Progress Notes (Signed)
Pharmacy Antibiotic Note  Andrew West is a 53 y.o. male with SCLC undergoing chemotherapy who was admitted on 05/23/2019 with neutropenic fever and possible sepsis.  Pharmacy was consulted for vancomycin, cefepime dosing and metronidazole was also added by MD.  On 05/26/2019 regimen was de-escalated to cefepime monotherapy.  Today, 05/27/2019 D#4 cefepime 2 g IV q8h Afebrile WBC 0.2K (received pegfilgrastim 6/27) SCr stable  Plan: No change to cefepime dosage  Follow for potential conversion to oral abx regimen.  Height: 5\' 9"  (175.3 cm) Weight: 160 lb 4.4 oz (72.7 kg) IBW/kg (Calculated) : 70.7  Temp (24hrs), Avg:98.6 F (37 C), Min:98.3 F (36.8 C), Max:99.2 F (37.3 C)  Recent Labs  Lab 05/23/19 1900 05/23/19 2055 05/24/19 0407 05/25/19 0510 05/26/19 0333 05/27/19 0325  WBC 0.1*  --  0.1* 0.1* 0.2* 0.2*  CREATININE 1.42*  --  1.33* 1.32* 1.37* 1.40*  LATICACIDVEN  --  0.8  --   --   --   --     Estimated Creatinine Clearance: 61.7 mL/min (A) (by C-G formula based on SCr of 1.4 mg/dL (H)).    Allergies  Allergen Reactions  . Iohexol Anaphylaxis    PT. TO BE PREMEDICATED PRIOR TO IV CONTRAST PER DR Kris Hartmann /MMS//12/15/15Desc: PT BECAME SOB AND CHEST TIGHTNESS AFTER CONTRAST INJECTION.  [name],RT-RCT., Onset Date: [id number]     Antimicrobials this admission: Vancomycin 7/2 >>7/4 Metronidazole 7/2>>7/4 Cefepime 7/2>>  Dose adjustments this admission: -  Microbiology results:  7/1 BCx: ngtd 7/1 SARSCoV2: neg  Thank you for allowing pharmacy to be a part of this patient's care.  Clayburn Pert, PharmD, BCPS [phone] 05/27/2019  8:56 AM

## 2019-05-27 NOTE — Discharge Summary (Signed)
Physician Discharge Summary  Andrew West ZOX:[id number] DOB: 1966-07-30 DOA: 05/23/2019  PCP: [name], MD  Admit date: 05/23/2019 Discharge date: 05/27/2019  Admitted From: Home Disposition:  Home  Recommendations for Outpatient Follow-up:  1. Follow up with Oncology this week 2. Follow-up with radiation oncology as previously scheduled 3. Recommend repeat CBC in 1 week to check on pancytopenia 4. Please follow-up on final blood cultures, they are negative at day of discharge.  Home Health: RN Equipment/Devices: None   Discharge Condition: Stable CODE STATUS: Full  Diet recommendation: Carb modified  Brief/Interim Summary: Andrew West is a 53 year old male with past medical history significant for small cell lung cancer undergoing chemotherapy, CAD, type 2 diabetes, hypertension, OSA, COPD who presents with fever, shortness of breath and hypotension.  His most recent chemotherapy treatment was 6/22.  He was noted to have fever up to 101 and presented to the emergency department.  He was admitted for further treatment and evaluation of neutropenic fever.  He was started on broad-spectrum antibiotics including vancomycin, cefepime, Flagyl.  Work-up was negative for any infection found, chest x-ray, urinalysis, blood cultures negative.  SARS-CoV-2 negative as well.  Antibiotics were de-escalated, patient remained afebrile.  Due to pancytopenia, he was transfused packed red blood cell and platelets.  Morning of discharge, he was eager to go home.  He remained afebrile since admission.  No report of any bleeding or fevers or chills.  Discussed with patient reasons for returning back to the hospital including fever, chills, any bleeding.  He will follow-up with oncology as an outpatient.  Discharge Diagnoses:  Principal Problem:   Neutropenic fever (HCC) Active Problems:   Diabetes mellitus (HCC)   HLD (hyperlipidemia)   Hereditary and idiopathic peripheral neuropathy   GERD    COPD with asthma (HCC)   Hypertension   CAD S/P multiple PCI's   Diastolic dysfunction-grade 2 with EF 60-65% Oct 2016   Benign prostatic hyperplasia with urinary hesitancy   Type 2 diabetes mellitus (HCC)   Sleep apnea   CKD (chronic kidney disease) stage 3, GFR 30-59 ml/min (HCC)   Bone metastases (HCC)   Sepsis secondary to neutropenic fever -Presented with fever 101.4, HR 99, WBC 0.1 -Lactic acid 0.8, procalcitonin 0.21 -Blood cultures negative growth to date -Chest x-ray negative for acute infiltrate -Urinalysis unremarkable for acute infection -SARS-CoV-2 negative -Empiric vancomycin, cefepime, Flagyl.  No source found.  De-escalated to cefepime only and has remained afebrile.  No source found.  Will discharge with empiric Levaquin to complete treatment  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 -Oncology consulted  Pancytopenia -Patient has already received Neulasta, no need for additional growth factor support -Transfuse for hemoglobin less than 7, platelet less than 10, or active bleeding -Ordered for packed red blood cell and platelet transfusion 7/4 -No evidence of bleeding noted.  Continue to monitor CBC as an outpatient.  CAD -Continue ranexa, Bystolic  CKD stage III -Baseline creatinine 1.4-1.5 -Stable  Hyperlipidemia -Continue crestor   Type 2 diabetes -SSI   Depression -Continue Cymbalta  Discharge Instructions  Discharge Instructions    Call MD for:   Complete by: As directed    Bleeding from any source, including nose bleeds, gum bleeds, blood in urine or stool.   Call MD for:  difficulty breathing, headache or visual disturbances   Complete by: As directed    Call MD for:  extreme fatigue   Complete by: As directed    Call MD for:  hives  Complete by: As directed    Call MD for:  persistant dizziness or light-headedness   Complete by: As directed    Call MD for:  persistant nausea and vomiting   Complete by: As directed     Call MD for:  severe uncontrolled pain   Complete by: As directed    Call MD for:  temperature >100.4   Complete by: As directed    Diet Carb Modified   Complete by: As directed    Discharge instructions   Complete by: As directed    You were cared for by a hospitalist during your hospital stay. If you have any questions about your discharge medications or the care you received while you were in the hospital after you are discharged, you can call the unit and ask to speak with the hospitalist on call if the hospitalist that took care of you is not available. Once you are discharged, your primary care physician will handle any further medical issues. Please note that NO REFILLS for any discharge medications will be authorized once you are discharged, as it is imperative that you return to your primary care physician (or establish a relationship with a primary care physician if you do not have one) for your aftercare needs so that they can reassess your need for medications and monitor your lab values.   Increase activity slowly   Complete by: As directed      Allergies as of 05/27/2019      Reactions   Iohexol Anaphylaxis   PT. TO BE PREMEDICATED PRIOR TO IV CONTRAST PER DR Eppie Gibson /MMS//12/15/15Desc: PT BECAME SOB AND CHEST TIGHTNESS AFTER CONTRAST INJECTION.  [name],RT-RCT., Onset Date: [id number]      Medication List    STOP taking these medications   acetaminophen-codeine 300-30 MG tablet Commonly known as: TYLENOL #3   albuterol 108 (90 Base) MCG/ACT inhaler Commonly known as: Ventolin HFA   aspirin 81 MG tablet   cetirizine 10 MG tablet Commonly known as: ZYRTEC   clopidogrel 75 MG tablet Commonly known as: PLAVIX   diclofenac sodium 1 % Gel Commonly known as: VOLTAREN   dronabinol 5 MG capsule Commonly known as: MARINOL   DULoxetine 60 MG capsule Commonly known as: Cymbalta   Fluticasone-Salmeterol 100-50 MCG/DOSE Aepb Commonly known as: ADVAIR    HYDROcodone-acetaminophen 5-325 MG tablet Commonly known as: NORCO/VICODIN   ondansetron 4 MG disintegrating tablet Commonly known as: Zofran ODT   prochlorperazine 10 MG tablet Commonly known as: COMPAZINE   promethazine 25 MG suppository Commonly known as: PHENERGAN   Vitamin D 50 MCG (2000 UT) tablet     TAKE these medications   Accu-Chek FastClix Lancets Misc Use as directed to test blood sugar once daily. DX E11.9   Accu-Chek Guide test strip Generic drug: glucose blood Use as directed to test blood sugar once daily. DX E11.9   Accu-Chek Guide w/Device Kit 1 each by Does not apply route daily. Use as directed to test blood sugar once daily. DX E11.9   acetaminophen 500 MG tablet Commonly known as: TYLENOL Take 1,000 mg by mouth every 6 (six) hours as needed for moderate pain.   levofloxacin 500 MG tablet Commonly known as: LEVAQUIN Take 1 tablet (500 mg total) by mouth daily for 5 days.   LORazepam 0.5 MG tablet Commonly known as: Ativan 1 tab po q 4-6 hours prn or 1 tab po 30 minutes prior to radiation What changed:   how much to take  how to  take this  when to take this  reasons to take this   mirtazapine 30 MG tablet Commonly known as: Remeron Take 1 tablet (30 mg total) by mouth at bedtime.   nebivolol 5 MG tablet Commonly known as: BYSTOLIC Take 1 tablet (5 mg total) by mouth daily.   nitroGLYCERIN 0.4 MG SL tablet Commonly known as: NITROSTAT Place 1 tablet (0.4 mg total) under the tongue every 5 (five) minutes as needed for chest pain.   oxyCODONE-acetaminophen 5-325 MG tablet Commonly known as: PERCOCET/ROXICET Take 1 tablet by mouth every 6 (six) hours as needed for up to 7 days for severe pain.   pantoprazole 40 MG tablet Commonly known as: PROTONIX Take 1 tablet (40 mg total) by mouth daily.   ranolazine 1000 MG SR tablet Commonly known as: Ranexa Take 1 tablet (1,000 mg total) by mouth 2 (two) times daily.   rosuvastatin 20 MG  tablet Commonly known as: Crestor Take 1 tablet (20 mg total) by mouth daily.   sucralfate 1 g tablet Commonly known as: CARAFATE Take 1 tablet by mouth 4 (four) times daily -  before meals and at bedtime.   tiotropium 18 MCG inhalation capsule Commonly known as: Spiriva HandiHaler Place 1 capsule (18 mcg total) into inhaler and inhale daily.   vitamin B-12 500 MCG tablet Commonly known as: CYANOCOBALAMIN Take 1 tablet (500 mcg total) by mouth daily.   zolpidem 5 MG tablet Commonly known as: AMBIEN Take 1 tablet (5 mg total) by mouth at bedtime.      Follow-up Information    Si Gaul, MD Follow up.   Specialty: Oncology Contact information: [address] [place] [address] [phone]          Allergies  Allergen Reactions  . Iohexol Anaphylaxis    PT. TO BE PREMEDICATED PRIOR TO IV CONTRAST PER DR Eppie Gibson /MMS//12/15/15Desc: PT BECAME SOB AND CHEST TIGHTNESS AFTER CONTRAST INJECTION.  Ardis Hughs., Onset Date: [id number]     Consultations:  Oncology   Procedures/Studies: Dg Chest Portable 1 View  Result Date: 05/23/2019 CLINICAL DATA:  Fever and weakness EXAM: PORTABLE CHEST 1 VIEW COMPARISON:  05/03/2019 FINDINGS: Cardiac shadow is stable. Left-sided chest wall port is again noted and stable. The lungs are well aerated without focal infiltrate or sizable effusion. Some scarring is noted in the right upper lobe. No focal infiltrate is seen. No effusion is noted. IMPRESSION: No acute abnormality seen. Electronically Signed   By: Alcide Clever M.D.   On: 05/23/2019 18:54     Discharge Exam: Vitals:   05/26/19 2100 05/27/19 0439  BP: 136/89 (!) 132/94  Pulse: 72 85  Resp: 20 18  Temp: 98.8 F (37.1 C) 98.3 F (36.8 C)  SpO2: 100% 100%    General: Pt is alert, awake, not in acute distress Cardiovascular: RRR, S1/S2 +, no rubs, no gallops Respiratory: CTA bilaterally, no wheezing, no rhonchi Abdominal: Soft, NT, ND, bowel  sounds + Extremities: no edema, no cyanosis    The results of significant diagnostics from this hospitalization (including imaging, microbiology, ancillary and laboratory) are listed below for reference.     Microbiology: Recent Results (from the past 240 hour(s))  SARS Coronavirus 2 (CEPHEID - Performed in [facility] hospital lab), Hosp Order     Status: None   Collection Time: 05/17/19 11:14 AM   Specimen: Nasopharyngeal Swab  Result Value Ref Range Status   SARS Coronavirus 2 NEGATIVE NEGATIVE Final    Comment: (NOTE) If result is NEGATIVE  SARS-CoV-2 target nucleic acids are NOT DETECTED. The SARS-CoV-2 RNA is generally detectable in upper and lower  respiratory specimens during the acute phase of infection. The lowest  concentration of SARS-CoV-2 viral copies this assay can detect is 250  copies / mL. A negative result does not preclude SARS-CoV-2 infection  and should not be used as the sole basis for treatment or other  patient management decisions.  A negative result may occur with  improper specimen collection / handling, submission of specimen other  than nasopharyngeal swab, presence of viral mutation(s) within the  areas targeted by this assay, and inadequate number of viral copies  (<250 copies / mL). A negative result must be combined with clinical  observations, patient history, and epidemiological information. If result is POSITIVE SARS-CoV-2 target nucleic acids are DETECTED. The SARS-CoV-2 RNA is generally detectable in upper and lower  respiratory specimens dur ing the acute phase of infection.  Positive  results are indicative of active infection with SARS-CoV-2.  Clinical  correlation with patient history and other diagnostic information is  necessary to determine patient infection status.  Positive results do  not rule out bacterial infection or co-infection with other viruses. If result is PRESUMPTIVE POSTIVE SARS-CoV-2 nucleic acids MAY BE PRESENT.   A  presumptive positive result was obtained on the submitted specimen  and confirmed on repeat testing.  While 2019 novel coronavirus  (SARS-CoV-2) nucleic acids may be present in the submitted sample  additional confirmatory testing may be necessary for epidemiological  and / or clinical management purposes  to differentiate between  SARS-CoV-2 and other Sarbecovirus currently known to infect humans.  If clinically indicated additional testing with an alternate test  methodology [phone]) is advised. The SARS-CoV-2 RNA is generally  detectable in upper and lower respiratory sp ecimens during the acute  phase of infection. The expected result is Negative. Fact Sheet for Patients:  BoilerBrush.com.cy Fact Sheet for Healthcare Providers: https://pope.com/ This test is not yet approved or cleared by the [place] FDA and has been authorized for detection and/or diagnosis of SARS-CoV-2 by FDA under an Emergency Use Authorization (EUA).  This EUA will remain in effect (meaning this test can be used) for the duration of the COVID-19 declaration under Section 564(b)(1) of the Act, 21 U.S.C. section 360bbb-3(b)(1), unless the authorization is terminated or revoked sooner. Performed at [facility], [address], [place], [place] [address]   SARS Coronavirus 2 (CEPHEID- Performed in [facility] hospital lab), Hosp Order     Status: None   Collection Time: 05/23/19  6:59 PM   Specimen: Nasopharyngeal Swab  Result Value Ref Range Status   SARS Coronavirus 2 NEGATIVE NEGATIVE Final    Comment: (NOTE) If result is NEGATIVE SARS-CoV-2 target nucleic acids are NOT DETECTED. The SARS-CoV-2 RNA is generally detectable in upper and lower  respiratory specimens during the acute phase of infection. The lowest  concentration of SARS-CoV-2 viral copies this assay can detect is 250  copies / mL. A negative result does not preclude SARS-CoV-2  infection  and should not be used as the sole basis for treatment or other  patient management decisions.  A negative result may occur with  improper specimen collection / handling, submission of specimen other  than nasopharyngeal swab, presence of viral mutation(s) within the  areas targeted by this assay, and inadequate number of viral copies  (<250 copies / mL). A negative result must be combined with clinical  observations, patient history, and epidemiological  information. If result is POSITIVE SARS-CoV-2 target nucleic acids are DETECTED. The SARS-CoV-2 RNA is generally detectable in upper and lower  respiratory specimens dur ing the acute phase of infection.  Positive  results are indicative of active infection with SARS-CoV-2.  Clinical  correlation with patient history and other diagnostic information is  necessary to determine patient infection status.  Positive results do  not rule out bacterial infection or co-infection with other viruses. If result is PRESUMPTIVE POSTIVE SARS-CoV-2 nucleic acids MAY BE PRESENT.   A presumptive positive result was obtained on the submitted specimen  and confirmed on repeat testing.  While 2019 novel coronavirus  (SARS-CoV-2) nucleic acids may be present in the submitted sample  additional confirmatory testing may be necessary for epidemiological  and / or clinical management purposes  to differentiate between  SARS-CoV-2 and other Sarbecovirus currently known to infect humans.  If clinically indicated additional testing with an alternate test  methodology [phone]) is advised. The SARS-CoV-2 RNA is generally  detectable in upper and lower respiratory sp ecimens during the acute  phase of infection. The expected result is Negative. Fact Sheet for Patients:  BoilerBrush.com.cy Fact Sheet for Healthcare Providers: https://pope.com/ This test is not yet approved or cleared by the [place]  FDA and has been authorized for detection and/or diagnosis of SARS-CoV-2 by FDA under an Emergency Use Authorization (EUA).  This EUA will remain in effect (meaning this test can be used) for the duration of the COVID-19 declaration under Section 564(b)(1) of the Act, 21 U.S.C. section 360bbb-3(b)(1), unless the authorization is terminated or revoked sooner. Performed at [facility], [address] [place], [place] [address]   Culture, blood (Routine x 2)     Status: None (Preliminary result)   Collection Time: 05/23/19  7:00 PM   Specimen: BLOOD  Result Value Ref Range Status   Specimen Description   Final    BLOOD PORTA CATH Performed at [facility], [address], [place], [place] [address]    Special Requests   Final    BOTTLES DRAWN AEROBIC AND ANAEROBIC Blood Culture adequate volume Performed at [facility], [address], [place], [place] [address]    Culture   Final    NO GROWTH 3 DAYS Performed at [facility], [address], [place], [place] [address]    Report Status PENDING  Incomplete  Culture, blood (Routine x 2)     Status: None (Preliminary result)   Collection Time: 05/23/19  8:56 PM   Specimen: BLOOD  Result Value Ref Range Status   Specimen Description   Final    BLOOD BLOOD LEFT WRIST Performed at [facility], [address] [place], [place] [address]    Special Requests   Final    BOTTLES DRAWN AEROBIC AND ANAEROBIC Blood Culture adequate volume Performed at [facility], [address], [place], [place] [address]    Culture   Final    NO GROWTH 3 DAYS Performed at [facility], [address], [place], [place] [address]    Report Status PENDING  Incomplete     Labs: BNP (last 3 results) No results for input(s): BNP in the last 8760 hours. Basic Metabolic Panel: Recent Labs  Lab 05/22/19 0922 05/23/19 1900 05/24/19 0407  05/25/19 0510 05/26/19 0333 05/27/19 0325  NA 143 143 143 143 142 141  K 3.6 3.9 3.8 3.8 3.6 3.5  CL 108 109  111 111 111 109  CO2 26 28 27 26 25 27   GLUCOSE 110* 90 78 75 72 71  BUN 21* 27* 26* 22* 23* 21*  CREATININE 1.50* 1.42* 1.33* 1.32* 1.37* 1.40*  CALCIUM 8.3* 8.4* 8.0* 8.2* 8.0* 8.3*  MG 1.8  --  1.7  --   --   --   PHOS  --   --  3.2  --   --   --    Liver Function Tests: Recent Labs  Lab 05/22/19 0922 05/23/19 1900 05/24/19 0407  AST 20 18 17   ALT 22 21 19   ALKPHOS 55 45 39  BILITOT 0.4 0.3 0.5  PROT 6.1* 6.3* 5.7*  ALBUMIN 3.0* 3.1* 2.7*   No results for input(s): LIPASE, AMYLASE in the last 168 hours. No results for input(s): AMMONIA in the last 168 hours. CBC: Recent Labs  Lab 05/22/19 0922 05/23/19 1900 05/24/19 0407 05/25/19 0510 05/26/19 0333 05/27/19 0325  WBC 3.6* 0.1* 0.1* 0.1* 0.2* 0.2*  NEUTROABS 3.2  --   --   --   --   --   HGB 8.6* 7.9* 7.1* 7.4* 6.7* 8.3*  HCT 26.6* 24.2* 22.6* 23.3* 20.9* 25.0*  MCV 85.3 87.4 87.9 88.3 87.8 86.8  PLT 76* 42* 31* 17* 8* 23*   Cardiac Enzymes: No results for input(s): CKTOTAL, CKMB, CKMBINDEX, TROPONINI in the last 168 hours. BNP: Invalid input(s): POCBNP CBG: Recent Labs  Lab 05/26/19 1235 05/26/19 1649 05/26/19 2102 05/27/19 0024 05/27/19 0733  GLUCAP 85 78 68* 85 108*   D-Dimer No results for input(s): DDIMER in the last 72 hours. Hgb A1c No results for input(s): HGBA1C in the last 72 hours. Lipid Profile No results for input(s): CHOL, HDL, LDLCALC, TRIG, CHOLHDL, LDLDIRECT in the last 72 hours. Thyroid function studies No results for input(s): TSH, T4TOTAL, T3FREE, THYROIDAB in the last 72 hours.  Invalid input(s): FREET3 Anemia work up No results for input(s): VITAMINB12, FOLATE, FERRITIN, TIBC, IRON, RETICCTPCT in the last 72 hours. Urinalysis    Component Value Date/Time   COLORURINE YELLOW 05/23/2019 2055   APPEARANCEUR CLEAR 05/23/2019 2055   LABSPEC 1.014 05/23/2019 2055    PHURINE 5.0 05/23/2019 2055   GLUCOSEU NEGATIVE 05/23/2019 2055   GLUCOSEU NEGATIVE 12/30/2008 1531   HGBUR SMALL (A) 05/23/2019 2055   BILIRUBINUR NEGATIVE 05/23/2019 2055   BILIRUBINUR small 10/10/2017 1455   KETONESUR NEGATIVE 05/23/2019 2055   PROTEINUR NEGATIVE 05/23/2019 2055   UROBILINOGEN 1.0 10/10/2017 1455   UROBILINOGEN 1.0 01/23/2014 2333   NITRITE NEGATIVE 05/23/2019 2055   LEUKOCYTESUR NEGATIVE 05/23/2019 2055   Sepsis Labs Invalid input(s): PROCALCITONIN,  WBC,  LACTICIDVEN Microbiology Recent Results (from the past 240 hour(s))  SARS Coronavirus 2 (CEPHEID - Performed in [facility] hospital lab), Hosp Order     Status: None   Collection Time: 05/17/19 11:14 AM   Specimen: Nasopharyngeal Swab  Result Value Ref Range Status   SARS Coronavirus 2 NEGATIVE NEGATIVE Final    Comment: (NOTE) If result is NEGATIVE SARS-CoV-2 target nucleic acids are NOT DETECTED. The SARS-CoV-2 RNA is generally detectable in upper and lower  respiratory specimens during the acute phase of infection. The lowest  concentration of SARS-CoV-2 viral copies this assay can detect is 250  copies / mL. A negative result does not preclude SARS-CoV-2 infection  and should not be used as the sole basis for treatment or other  patient management decisions.  A negative result may occur with  improper specimen collection / handling,  submission of specimen other  than nasopharyngeal swab, presence of viral mutation(s) within the  areas targeted by this assay, and inadequate number of viral copies  (<250 copies / mL). A negative result must be combined with clinical  observations, patient history, and epidemiological information. If result is POSITIVE SARS-CoV-2 target nucleic acids are DETECTED. The SARS-CoV-2 RNA is generally detectable in upper and lower  respiratory specimens dur ing the acute phase of infection.  Positive  results are indicative of active infection with SARS-CoV-2.  Clinical   correlation with patient history and other diagnostic information is  necessary to determine patient infection status.  Positive results do  not rule out bacterial infection or co-infection with other viruses. If result is PRESUMPTIVE POSTIVE SARS-CoV-2 nucleic acids MAY BE PRESENT.   A presumptive positive result was obtained on the submitted specimen  and confirmed on repeat testing.  While 2019 novel coronavirus  (SARS-CoV-2) nucleic acids may be present in the submitted sample  additional confirmatory testing may be necessary for epidemiological  and / or clinical management purposes  to differentiate between  SARS-CoV-2 and other Sarbecovirus currently known to infect humans.  If clinically indicated additional testing with an alternate test  methodology [phone]) is advised. The SARS-CoV-2 RNA is generally  detectable in upper and lower respiratory sp ecimens during the acute  phase of infection. The expected result is Negative. Fact Sheet for Patients:  BoilerBrush.com.cy Fact Sheet for Healthcare Providers: https://pope.com/ This test is not yet approved or cleared by the [place] FDA and has been authorized for detection and/or diagnosis of SARS-CoV-2 by FDA under an Emergency Use Authorization (EUA).  This EUA will remain in effect (meaning this test can be used) for the duration of the COVID-19 declaration under Section 564(b)(1) of the Act, 21 U.S.C. section 360bbb-3(b)(1), unless the authorization is terminated or revoked sooner. Performed at [facility], [address] [place], [place] [address]   SARS Coronavirus 2 (CEPHEID- Performed in [facility] hospital lab), Hosp Order     Status: None   Collection Time: 05/23/19  6:59 PM   Specimen: Nasopharyngeal Swab  Result Value Ref Range Status   SARS Coronavirus 2 NEGATIVE NEGATIVE Final    Comment: (NOTE) If result is NEGATIVE SARS-CoV-2 target nucleic  acids are NOT DETECTED. The SARS-CoV-2 RNA is generally detectable in upper and lower  respiratory specimens during the acute phase of infection. The lowest  concentration of SARS-CoV-2 viral copies this assay can detect is 250  copies / mL. A negative result does not preclude SARS-CoV-2 infection  and should not be used as the sole basis for treatment or other  patient management decisions.  A negative result may occur with  improper specimen collection / handling, submission of specimen other  than nasopharyngeal swab, presence of viral mutation(s) within the  areas targeted by this assay, and inadequate number of viral copies  (<250 copies / mL). A negative result must be combined with clinical  observations, patient history, and epidemiological information. If result is POSITIVE SARS-CoV-2 target nucleic acids are DETECTED. The SARS-CoV-2 RNA is generally detectable in upper and lower  respiratory specimens dur ing the acute phase of infection.  Positive  results are indicative of active infection with SARS-CoV-2.  Clinical  correlation with patient history and other diagnostic information is  necessary to determine patient infection status.  Positive results do  not rule out bacterial infection or co-infection with other viruses. If result is PRESUMPTIVE POSTIVE SARS-CoV-2 nucleic  acids MAY BE PRESENT.   A presumptive positive result was obtained on the submitted specimen  and confirmed on repeat testing.  While 2019 novel coronavirus  (SARS-CoV-2) nucleic acids may be present in the submitted sample  additional confirmatory testing may be necessary for epidemiological  and / or clinical management purposes  to differentiate between  SARS-CoV-2 and other Sarbecovirus currently known to infect humans.  If clinically indicated additional testing with an alternate test  methodology [id number]) is advised. The SARS-CoV-2 RNA is generally  detectable in upper and lower respiratory  sp ecimens during the acute  phase of infection. The expected result is Negative. Fact Sheet for Patients:  BoilerBrush.com.cy Fact Sheet for Healthcare Providers: https://pope.com/ This test is not yet approved or cleared by the [place] FDA and has been authorized for detection and/or diagnosis of SARS-CoV-2 by FDA under an Emergency Use Authorization (EUA).  This EUA will remain in effect (meaning this test can be used) for the duration of the COVID-19 declaration under Section 564(b)(1) of the Act, 21 U.S.C. section 360bbb-3(b)(1), unless the authorization is terminated or revoked sooner. Performed at [facility], [address] [place], [place] [address]   Culture, blood (Routine x 2)     Status: None (Preliminary result)   Collection Time: 05/23/19  7:00 PM   Specimen: BLOOD  Result Value Ref Range Status   Specimen Description   Final    BLOOD PORTA CATH Performed at [facility], [address], [place], [place] [address]    Special Requests   Final    BOTTLES DRAWN AEROBIC AND ANAEROBIC Blood Culture adequate volume Performed at [facility], [address], [place], [place] [address]    Culture   Final    NO GROWTH 3 DAYS Performed at [facility], [address] [place], [place] [address]    Report Status PENDING  Incomplete  Culture, blood (Routine x 2)     Status: None (Preliminary result)   Collection Time: 05/23/19  8:56 PM   Specimen: BLOOD  Result Value Ref Range Status   Specimen Description   Final    BLOOD BLOOD LEFT WRIST Performed at [facility], [address] [place], [place] [address]    Special Requests   Final    BOTTLES DRAWN AEROBIC AND ANAEROBIC Blood Culture adequate volume Performed at [facility], [address], [place], [place] [address]    Culture   Final    NO GROWTH 3  DAYS Performed at [facility], [address] [place], [place] [address]    Report Status PENDING  Incomplete     Patient was seen and examined on the day of discharge and was found to be in stable condition. Time coordinating discharge: 25 minutes including assessment and coordination of care, as well as examination of the patient.   SIGNED:  Noralee Stain, DO [facility] [email or web address] 05/27/2019, 9:12 AM

## 2019-05-27 NOTE — Progress Notes (Signed)
Discharge instructions explained to patient, states understanding. Port flushed with 500 units of heparin. Wife in to pick patient up and take him home.

## 2019-05-27 NOTE — Progress Notes (Signed)
CRITICAL VALUE ALERT  Critical Value:  Plt 23  Date & Time Notied:  05/27/2019 0440   Provider Notified: Kennon Holter NP   Orders Received/Actions taken:

## 2019-05-28 ENCOUNTER — Ambulatory Visit
Admission: RE | Admit: 2019-05-28 | Discharge: 2019-05-28 | Disposition: A | Discharge status: Home / Self Care | Payer: Medicare Other | Source: Ambulatory Visit | Attending: Radiation Oncology | Admitting: Radiation Oncology

## 2019-05-28 ENCOUNTER — Telehealth: Payer: Self-pay | Admitting: *Deleted

## 2019-05-28 ENCOUNTER — Other Ambulatory Visit: Payer: Self-pay | Admitting: Medical Oncology

## 2019-05-28 ENCOUNTER — Other Ambulatory Visit: Payer: Self-pay | Admitting: Radiation Oncology

## 2019-05-28 ENCOUNTER — Other Ambulatory Visit: Payer: Self-pay | Admitting: Physician Assistant

## 2019-05-28 ENCOUNTER — Inpatient Hospital Stay: Payer: Medicare Other | Attending: Internal Medicine

## 2019-05-28 ENCOUNTER — Other Ambulatory Visit: Payer: Self-pay

## 2019-05-28 VITALS — BP 107/70 | HR 87 | Temp 97.8°F | Resp 17

## 2019-05-28 DIAGNOSIS — Z510 Encounter for antineoplastic radiation therapy: Secondary | ICD-10-CM | POA: Diagnosis not present

## 2019-05-28 DIAGNOSIS — C3411 Malignant neoplasm of upper lobe, right bronchus or lung: Secondary | ICD-10-CM | POA: Diagnosis not present

## 2019-05-28 DIAGNOSIS — Z79899 Other long term (current) drug therapy: Secondary | ICD-10-CM | POA: Diagnosis not present

## 2019-05-28 DIAGNOSIS — D696 Thrombocytopenia, unspecified: Secondary | ICD-10-CM

## 2019-05-28 DIAGNOSIS — D61818 Other pancytopenia: Secondary | ICD-10-CM | POA: Insufficient documentation

## 2019-05-28 DIAGNOSIS — C787 Secondary malignant neoplasm of liver and intrahepatic bile duct: Secondary | ICD-10-CM | POA: Insufficient documentation

## 2019-05-28 DIAGNOSIS — Z5112 Encounter for antineoplastic immunotherapy: Secondary | ICD-10-CM | POA: Insufficient documentation

## 2019-05-28 DIAGNOSIS — C7951 Secondary malignant neoplasm of bone: Secondary | ICD-10-CM | POA: Diagnosis not present

## 2019-05-28 DIAGNOSIS — D6481 Anemia due to antineoplastic chemotherapy: Secondary | ICD-10-CM | POA: Insufficient documentation

## 2019-05-28 DIAGNOSIS — Z5189 Encounter for other specified aftercare: Secondary | ICD-10-CM | POA: Insufficient documentation

## 2019-05-28 DIAGNOSIS — Z5111 Encounter for antineoplastic chemotherapy: Secondary | ICD-10-CM | POA: Diagnosis not present

## 2019-05-28 DIAGNOSIS — Z95828 Presence of other vascular implants and grafts: Secondary | ICD-10-CM

## 2019-05-28 LAB — CBC WITH DIFFERENTIAL (CANCER CENTER ONLY)
Abs Immature Granulocytes: 0 10*3/uL (ref 0.00–0.07)
Band Neutrophils: 6 %
Basophils Absolute: 0 10*3/uL (ref 0.0–0.1)
Basophils Relative: 0 %
Eosinophils Absolute: 0 10*3/uL (ref 0.0–0.5)
Eosinophils Relative: 1 %
HCT: 25.3 % — ABNORMAL LOW (ref 39.0–52.0)
Hemoglobin: 8.5 g/dL — ABNORMAL LOW (ref 13.0–17.0)
Lymphocytes Relative: 60 %
Lymphs Abs: 0.2 10*3/uL — ABNORMAL LOW (ref 0.7–4.0)
MCH: 28.5 pg (ref 26.0–34.0)
MCHC: 33.6 g/dL (ref 30.0–36.0)
MCV: 84.9 fL (ref 80.0–100.0)
Monocytes Absolute: 0 10*3/uL — ABNORMAL LOW (ref 0.1–1.0)
Monocytes Relative: 4 %
Neutro Abs: 0.1 10*3/uL — CL (ref 1.7–17.7)
Neutrophils Relative %: 29 %
Platelet Count: 10 10*3/uL — ABNORMAL LOW (ref 150–400)
RBC: 2.98 MIL/uL — ABNORMAL LOW (ref 4.22–5.81)
RDW: 13 % (ref 11.5–15.5)
WBC Count: 0.4 10*3/uL — CL (ref 4.0–10.5)
nRBC: 0 % (ref 0.0–0.2)

## 2019-05-28 LAB — CULTURE, BLOOD (ROUTINE X 2)
Culture: NO GROWTH
Culture: NO GROWTH
Special Requests: ADEQUATE
Special Requests: ADEQUATE

## 2019-05-28 LAB — CMP (CANCER CENTER ONLY)
ALT: 10 U/L (ref 0–44)
AST: 11 U/L — ABNORMAL LOW (ref 15–41)
Albumin: 2.8 g/dL — ABNORMAL LOW (ref 3.5–5.0)
Alkaline Phosphatase: 44 U/L (ref 38–126)
Anion gap: 11 (ref 5–15)
BUN: 30 mg/dL — ABNORMAL HIGH (ref 6–20)
CO2: 26 mmol/L (ref 22–32)
Calcium: 8.3 mg/dL — ABNORMAL LOW (ref 8.9–10.3)
Chloride: 107 mmol/L (ref 98–111)
Creatinine: 1.57 mg/dL — ABNORMAL HIGH (ref 0.61–1.24)
GFR, Est AFR Am: 58 mL/min — ABNORMAL LOW (ref >60)
GFR, Estimated: 50 mL/min — ABNORMAL LOW (ref >60)
Glucose, Bld: 72 mg/dL (ref 70–99)
Potassium: 3.6 mmol/L (ref 3.5–5.1)
Sodium: 144 mmol/L (ref 135–145)
Total Bilirubin: 0.4 mg/dL (ref 0.3–1.2)
Total Protein: 6.4 g/dL — ABNORMAL LOW (ref 6.5–8.1)

## 2019-05-28 LAB — ABO/RH: ABO/RH(D): A POS

## 2019-05-28 LAB — MAGNESIUM: Magnesium: 1.4 mg/dL — CL (ref 1.7–2.4)

## 2019-05-28 MED ORDER — ACETAMINOPHEN 325 MG PO TABS
650.0000 mg | ORAL_TABLET | Freq: Once | ORAL | Status: AC
  Administered 2019-05-28: 16:00:00 650 mg via ORAL

## 2019-05-28 MED ORDER — DIPHENHYDRAMINE HCL 25 MG PO CAPS
ORAL_CAPSULE | ORAL | Status: AC
  Filled 2019-05-28: qty 1

## 2019-05-28 MED ORDER — HEPARIN SOD (PORK) LOCK FLUSH 100 UNIT/ML IV SOLN
500.0000 [IU] | Freq: Every day | INTRAVENOUS | Status: AC | PRN
  Administered 2019-05-28: 17:00:00 500 [IU]
  Filled 2019-05-28: qty 5

## 2019-05-28 MED ORDER — DIPHENHYDRAMINE HCL 25 MG PO CAPS
25.0000 mg | ORAL_CAPSULE | Freq: Once | ORAL | Status: AC
  Administered 2019-05-28: 16:00:00 25 mg via ORAL

## 2019-05-28 MED ORDER — SODIUM CHLORIDE 0.9% IV SOLUTION
250.0000 mL | Freq: Once | INTRAVENOUS | Status: AC
  Administered 2019-05-28: 250 mL via INTRAVENOUS
  Filled 2019-05-28: qty 250

## 2019-05-28 MED ORDER — TBO-FILGRASTIM 300 MCG/0.5ML ~~LOC~~ SOSY: 300.0000 ug | PREFILLED_SYRINGE | Freq: Once | SUBCUTANEOUS | Status: DC

## 2019-05-28 MED ORDER — SODIUM CHLORIDE 0.9% FLUSH
10.0000 mL | INTRAVENOUS | Status: AC | PRN
  Administered 2019-05-28: 10 mL
  Filled 2019-05-28: qty 10

## 2019-05-28 MED ORDER — TBO-FILGRASTIM 300 MCG/0.5ML ~~LOC~~ SOSY
PREFILLED_SYRINGE | SUBCUTANEOUS | Status: AC
  Filled 2019-05-28: qty 0.5

## 2019-05-28 MED ORDER — ACETAMINOPHEN 325 MG PO TABS
ORAL_TABLET | ORAL | Status: AC
  Filled 2019-05-28: qty 2

## 2019-05-28 NOTE — Patient Instructions (Addendum)
Platelet Transfusion A platelet transfusion is a procedure in which you receive donated platelets through an IV. Platelets are tiny pieces of blood cells. When you get an injury, platelets clump together in the area to form a blood clot. This helps stop bleeding and is the beginning of the healing process. If you have too few platelets, your blood may have trouble clotting. This may cause you to bleed and bruise very easily. You may need a platelet transfusion if you have a condition that causes a low number of platelets (thrombocytopenia). A platelet transfusion may be used to stop or prevent excessive bleeding. Tell a health care provider about:  Any reactions you have had during previous transfusions.  Any allergies you have.  All medicines you are taking, including vitamins, herbs, eye drops, creams, and over-the-counter medicines.  Any blood disorders you have.  Any surgeries you have had.  Any medical conditions you have.  Whether you are pregnant or may be pregnant. What are the risks? Generally, this is a safe procedure. However, problems may occur, including:  Fever.  Infection.  Allergic reaction to the donor platelets.  Your body's disease-fighting system (immune system) attacking the donor platelets (hemolytic reaction). This is rare.  A rare reaction that causes lung damage (transfusion-related acute lung injury). What happens before the procedure? Medicines  Ask your health care provider about: ? Changing or stopping your regular medicines. This is especially important if you are taking diabetes medicines or blood thinners. ? Taking medicines such as aspirin and ibuprofen. These medicines can thin your blood. Do not take these medicines unless your health care provider tells you to take them. ? Taking over-the-counter medicines, vitamins, herbs, and supplements. General instructions  You will have a blood test to determine your blood type. Your blood type  determines what kind of platelets you will be given.  Follow instructions from your health care provider about eating or drinking restrictions.  If you have had an allergic reaction to a transfusion in the past, you may be given medicine to help prevent a reaction.  Your temperature, blood pressure, pulse, and breathing will be monitored. What happens during the procedure?   An IV will be inserted into one of your veins.  For your safety, two health care providers will verify your identity along with the donor platelets about to be infused.  A bag of donor platelets will be connected to your IV. The platelets will flow into your bloodstream. This usually takes 30-60 minutes.  Your temperature, blood pressure, pulse, and breathing will be monitored during the transfusion. This helps detect early signs of any reaction.  You will also be monitored for other symptoms that may indicate a reaction, including chills, hives, or itching.  If you have signs of a reaction at any time, your transfusion will be stopped, and you may be given medicine to help manage the reaction.  When your transfusion is complete, your IV will be removed.  Pressure may be applied to the IV site for a few minutes to stop any bleeding.  The IV site will be covered with a bandage (dressing). The procedure may vary among health care providers and hospitals. What happens after the procedure?  Your blood pressure, temperature, pulse, and breathing will be monitored until you leave the hospital or clinic.  You may have some bruising and soreness at your IV site. Follow these instructions at home: Medicines  Take over-the-counter and prescription medicines only as told by your health care provider.  Talk with your health care provider before you take any medicines that contain aspirin or NSAIDs. These medicines increase your risk for dangerous bleeding. General instructions  Change or remove your dressing as told  by your health care provider.  Return to your normal activities as told by your health care provider. Ask your health care provider what activities are safe for you.  Do not take baths, swim, or use a hot tub until your health care provider approves. Ask your health care provider if you may take showers.  Check your IV site every day for signs of infection. Check for: ? Redness, swelling, or pain. ? Fluid or blood. If fluid or blood drains from your IV site, use your hands to press down firmly on a bandage covering the area for a minute or two. Doing this should stop the bleeding. ? Warmth. ? Pus or a bad smell.  Keep all follow-up visits as told by your health care provider. This is important. Contact a health care provider if you have:  A headache that does not go away with medicine.  Hives, rash, or itchy skin.  Nausea or vomiting.  Unusual tiredness or weakness.  Signs of infection at your IV site. Get help right away if:  You have a fever or chills.  You urinate less often than usual.  Your urine is darker colored than normal.  You have any of the following: ? Trouble breathing. ? Pain in your back, abdomen, or chest. ? Cool, clammy skin. ? A fast heartbeat. Summary  Platelets are tiny pieces of blood cells that clump together to form a blood clot when you have an injury. If you have too few platelets, your blood may have trouble clotting.  A platelet transfusion is a procedure in which you receive donated platelets through an IV.  A platelet transfusion may be used to stop or prevent excessive bleeding.  After the procedure, check your IV site every day for signs of infection, including redness, swelling, pain, or warmth. This information is not intended to replace advice given to you by your health care provider. Make sure you discuss any questions you have with your health care provider. Document Released: 09/05/2007 Document Revised: 12/14/2017 Document  Reviewed: 12/14/2017 Elsevier Patient Education  [place].  Tbo-Filgrastim injection What is this medicine? TBO-FILGRASTIM (T B O fil GRA stim) is a granulocyte colony-stimulating factor that helps you make more neutrophils, a type of white blood cell. Neutrophils are important for fighting infections. Some chemotherapy affects your bone marrow and lowers your neutrophils. This medicine helps decrease the length of time that neutrophils are very low (severe neutropenia). This medicine may be used for other purposes; ask your health care provider or pharmacist if you have questions. COMMON BRAND NAME(S): Granix What should I tell my health care provider before I take this medicine? They need to know if you have any of these conditions:  bone scan or tests planned  kidney disease  sickle cell anemia  an unusual or allergic reaction to tbo-filgrastim, filgrastim, pegfilgrastim, other medicines, foods, dyes, or preservatives  pregnant or trying to get pregnant  breast-feeding How should I use this medicine? This medicine is for injection under the skin. If you get this medicine at home, you will be taught how to prepare and give this medicine. Refer to the Instructions for Use that come with your medication packaging. Use exactly as directed. Take your medicine at regular intervals. Do not take your medicine more often  than directed. It is important that you put your used needles and syringes in a special sharps container. Do not put them in a trash can. If you do not have a sharps container, call your pharmacist or healthcare provider to get one. Talk to your pediatrician regarding the use of this medicine in children. While this drug may be prescribed for children as young as 29 month of age for selected conditions, precautions do apply. Overdosage: If you think you have taken too much of this medicine contact a poison control center or emergency room at once. NOTE: This medicine is  only for you. Do not share this medicine with others. What if I miss a dose? It is important not to miss your dose. Call your doctor or health care professional if you miss a dose. What may interact with this medicine? This medicine may interact with the following medications:  medicines that may cause a release of neutrophils, such as lithium This list may not describe all possible interactions. Give your health care provider a list of all the medicines, herbs, non-prescription drugs, or dietary supplements you use. Also tell them if you smoke, drink alcohol, or use illegal drugs. Some items may interact with your medicine. What should I watch for while using this medicine? You may need blood work done while you are taking this medicine. What side effects may I notice from receiving this medicine? Side effects that you should report to your doctor or health care professional as soon as possible:  allergic reactions like skin rash, itching or hives, swelling of the face, lips, or tongue  back pain  blood in the urine  dark urine  dizziness  fast heartbeat  feeling faint  shortness of breath or breathing problems  signs and symptoms of infection like fever or chills; cough; or sore throat  signs and symptoms of kidney injury like trouble passing urine or change in the amount of urine  stomach or side pain, or pain at the shoulder  sweating  swelling of the legs, ankles, or abdomen  tiredness Side effects that usually do not require medical attention (report to your doctor or health care professional if they continue or are bothersome):  bone pain  diarrhea  headache  muscle pain  vomiting This list may not describe all possible side effects. Call your doctor for medical advice about side effects. You may report side effects to FDA at 1-800-FDA-1088. Where should I keep my medicine? Keep out of the reach of children. Store in a refrigerator between 2 and 8 degrees  C (36 and 46 degrees F). Keep in carton to protect from light. Throw away this medicine if it is left out of the refrigerator for more than 5 consecutive days. Throw away any unused medicine after the expiration date. NOTE: This sheet is a summary. It may not cover all possible information. If you have questions about this medicine, talk to your doctor, pharmacist, or health care provider.  2020 Elsevier/Gold Standard (2018-09-09 19:58:39)   Coronavirus (COVID-19) Are you at risk?  Are you at risk for the Coronavirus (COVID-19)?  To be considered HIGH RISK for Coronavirus (COVID-19), you have to meet the following criteria:  . Traveled to [place], [place], [place], [place] or [place]; or in the [place] to [place], [place], [place], or [place]; and have fever, cough, and shortness of breath within the last 2 weeks of travel OR . Been in close contact with a person diagnosed with COVID-19  within the last 2 weeks and have fever, cough, and shortness of breath . IF YOU DO NOT MEET THESE CRITERIA, YOU ARE CONSIDERED LOW RISK FOR COVID-19.  What to do if you are HIGH RISK for COVID-19?  Marland Kitchen If you are having a medical emergency, call 911. . Seek medical care right away. Before you go to a doctor's office, urgent care or emergency department, call ahead and tell them about your recent travel, contact with someone diagnosed with COVID-19, and your symptoms. You should receive instructions from your physician's office regarding next steps of care.  . When you arrive at healthcare provider, tell the healthcare staff immediately you have returned from visiting [place], [place], [place], [place] or [place]; or traveled in the [place] to [place], [place], [place], or [place]; in the last two weeks or you have been in close contact with a person diagnosed with COVID-19 in the last 2 weeks.   . Tell the health care staff about your symptoms: fever, cough and shortness of breath. . After  you have been seen by a medical provider, you will be either: o Tested for (COVID-19) and discharged home on quarantine except to seek medical care if symptoms worsen, and asked to  - Stay home and avoid contact with others until you get your results (4-5 days)  - Avoid travel on public transportation if possible (such as bus, train, or airplane) or o Sent to the Emergency Department by EMS for evaluation, COVID-19 testing, and possible admission depending on your condition and test results.  What to do if you are LOW RISK for COVID-19?  Reduce your risk of any infection by using the same precautions used for avoiding the common cold or flu:  Marland Kitchen Wash your hands often with soap and warm water for at least 20 seconds.  If soap and water are not readily available, use an alcohol-based hand sanitizer with at least 60% alcohol.  . If coughing or sneezing, cover your mouth and nose by coughing or sneezing into the elbow areas of your shirt or coat, into a tissue or into your sleeve (not your hands). . Avoid shaking hands with others and consider head nods or verbal greetings only. . Avoid touching your eyes, nose, or mouth with unwashed hands.  . Avoid close contact with people who are sick. . Avoid places or events with large numbers of people in one location, like concerts or sporting events. . Carefully consider travel plans you have or are making. . If you are planning any travel outside or inside the [place], visit the CDC's Travelers' Health webpage for the latest health notices. . If you have some symptoms but not all symptoms, continue to monitor at home and seek medical attention if your symptoms worsen. . If you are having a medical emergency, call 911.   [employer] [place] / e-Visit: [email or web address]         [facility]: [facility]: [phone]                    [facility]: [phone]

## 2019-05-28 NOTE — Telephone Encounter (Signed)
Received call report from [facility].  "Today's Mg+ = 1.4."  Message left with results.    [facility] called reporting ANC = 0.9 for this patient.

## 2019-05-28 NOTE — Telephone Encounter (Signed)
CALLED PATIENT TO ASK ABOUT COMING IN FOR LABS PRIOR TO TREATMENT TODAY, PATIENT AGREED TO COME IN FOR LAB

## 2019-05-28 NOTE — Telephone Encounter (Signed)
Received call report from [facility].  "Today's WBC = 0.4, Pltc = 10, 000.."  Called collaborative with results.

## 2019-05-28 NOTE — Progress Notes (Signed)
Patient did not get granix today- prior auth was being evaluated and patient did not want to wait. Cassie, PA aware and said it can wait until his next visit tomorrow. Patient and pharmacy aware.

## 2019-05-28 NOTE — Telephone Encounter (Signed)
Called patient to ask about doing labs tomorrow before tx., patient agreed to have labs drawn at the same time that his med/onc labs was being drawn @ 11 am on 05/29/19

## 2019-05-29 ENCOUNTER — Ambulatory Visit: Payer: Medicare Other

## 2019-05-29 ENCOUNTER — Other Ambulatory Visit: Payer: Self-pay

## 2019-05-29 ENCOUNTER — Inpatient Hospital Stay: Payer: Medicare Other

## 2019-05-29 DIAGNOSIS — Z431 Encounter for attention to gastrostomy: Secondary | ICD-10-CM | POA: Diagnosis not present

## 2019-05-29 DIAGNOSIS — D6481 Anemia due to antineoplastic chemotherapy: Secondary | ICD-10-CM | POA: Diagnosis not present

## 2019-05-29 DIAGNOSIS — C7951 Secondary malignant neoplasm of bone: Secondary | ICD-10-CM | POA: Diagnosis not present

## 2019-05-29 DIAGNOSIS — Z5112 Encounter for antineoplastic immunotherapy: Secondary | ICD-10-CM | POA: Diagnosis not present

## 2019-05-29 DIAGNOSIS — E43 Unspecified severe protein-calorie malnutrition: Secondary | ICD-10-CM | POA: Diagnosis not present

## 2019-05-29 DIAGNOSIS — C787 Secondary malignant neoplasm of liver and intrahepatic bile duct: Secondary | ICD-10-CM | POA: Diagnosis not present

## 2019-05-29 DIAGNOSIS — C3401 Malignant neoplasm of right main bronchus: Secondary | ICD-10-CM | POA: Diagnosis not present

## 2019-05-29 DIAGNOSIS — Z95828 Presence of other vascular implants and grafts: Secondary | ICD-10-CM

## 2019-05-29 DIAGNOSIS — C3411 Malignant neoplasm of upper lobe, right bronchus or lung: Secondary | ICD-10-CM | POA: Diagnosis not present

## 2019-05-29 DIAGNOSIS — Z5111 Encounter for antineoplastic chemotherapy: Secondary | ICD-10-CM | POA: Diagnosis not present

## 2019-05-29 LAB — CBC WITH DIFFERENTIAL (CANCER CENTER ONLY)
Abs Immature Granulocytes: 0 10*3/uL (ref 0.00–0.07)
Basophils Absolute: 0 10*3/uL (ref 0.0–0.1)
Basophils Relative: 0 %
Eosinophils Absolute: 0 10*3/uL (ref 0.0–0.5)
Eosinophils Relative: 2 %
HCT: 23.8 % — ABNORMAL LOW (ref 39.0–52.0)
Hemoglobin: 7.9 g/dL — ABNORMAL LOW (ref 13.0–17.0)
Immature Granulocytes: 0 %
Lymphocytes Relative: 39 %
Lymphs Abs: 0.2 10*3/uL — ABNORMAL LOW (ref 0.7–4.0)
MCH: 28.3 pg (ref 26.0–34.0)
MCHC: 33.2 g/dL (ref 30.0–36.0)
MCV: 85.3 fL (ref 80.0–100.0)
Monocytes Absolute: 0.1 10*3/uL (ref 0.1–1.0)
Monocytes Relative: 15 %
Neutro Abs: 0.3 10*3/uL — CL (ref 1.7–7.7)
Neutrophils Relative %: 44 %
Platelet Count: 13 10*3/uL — ABNORMAL LOW (ref 150–400)
RBC: 2.79 MIL/uL — ABNORMAL LOW (ref 4.22–5.81)
RDW: 13.1 % (ref 11.5–15.5)
WBC Count: 0.6 10*3/uL — CL (ref 4.0–10.5)
nRBC: 0 % (ref 0.0–0.2)

## 2019-05-29 LAB — CMP (CANCER CENTER ONLY)
ALT: 9 U/L (ref 0–44)
AST: 11 U/L — ABNORMAL LOW (ref 15–41)
Albumin: 2.6 g/dL — ABNORMAL LOW (ref 3.5–5.0)
Alkaline Phosphatase: 50 U/L (ref 38–126)
Anion gap: 7 (ref 5–15)
BUN: 30 mg/dL — ABNORMAL HIGH (ref 6–20)
CO2: 28 mmol/L (ref 22–32)
Calcium: 7.7 mg/dL — ABNORMAL LOW (ref 8.9–10.3)
Chloride: 108 mmol/L (ref 98–111)
Creatinine: 1.35 mg/dL — ABNORMAL HIGH (ref 0.61–1.24)
GFR, Est AFR Am: >60 mL/min (ref >60)
GFR, Estimated: 60 mL/min — ABNORMAL LOW (ref >60)
Glucose, Bld: 81 mg/dL (ref 70–99)
Potassium: 3.7 mmol/L (ref 3.5–5.1)
Sodium: 143 mmol/L (ref 135–145)
Total Bilirubin: 0.4 mg/dL (ref 0.3–1.2)
Total Protein: 5.7 g/dL — ABNORMAL LOW (ref 6.5–8.1)

## 2019-05-29 LAB — PREPARE PLATELET PHERESIS: Unit division: 0

## 2019-05-29 LAB — BPAM PLATELET PHERESIS
Blood Product Expiration Date: 202007082359
ISSUE DATE / TIME: 202007061606
Unit Type and Rh: 6200

## 2019-05-29 LAB — MAGNESIUM: Magnesium: 2.5 mg/dL — ABNORMAL HIGH (ref 1.7–2.4)

## 2019-05-29 MED ORDER — TBO-FILGRASTIM 300 MCG/0.5ML ~~LOC~~ SOSY
300.0000 ug | PREFILLED_SYRINGE | Freq: Once | SUBCUTANEOUS | Status: AC
  Administered 2019-05-29: 300 ug via SUBCUTANEOUS

## 2019-05-29 MED ORDER — SODIUM CHLORIDE 0.9% FLUSH
10.0000 mL | INTRAVENOUS | Status: DC | PRN
  Administered 2019-05-29: 10 mL
  Filled 2019-05-29: qty 10

## 2019-05-29 MED ORDER — TBO-FILGRASTIM 300 MCG/0.5ML ~~LOC~~ SOSY
PREFILLED_SYRINGE | SUBCUTANEOUS | Status: AC
  Filled 2019-05-29: qty 0.5

## 2019-05-29 MED ORDER — HEPARIN SOD (PORK) LOCK FLUSH 100 UNIT/ML IV SOLN
500.0000 [IU] | Freq: Once | INTRAVENOUS | Status: AC | PRN
  Administered 2019-05-29: 500 [IU]
  Filled 2019-05-29: qty 5

## 2019-05-29 MED ORDER — SODIUM CHLORIDE 0.9 % IV SOLN
INTRAVENOUS | Status: DC
  Administered 2019-05-29: 09:00:00 via INTRAVENOUS
  Filled 2019-05-29 (×2): qty 1000

## 2019-05-29 NOTE — Patient Instructions (Signed)
Hypomagnesemia Hypomagnesemia is a condition in which the level of magnesium in the blood is low. Magnesium is a mineral that is found in many foods. It is used in many different processes in the body. Hypomagnesemia can affect every organ in the body. In severe cases, it can cause life-threatening problems. What are the causes? This condition may be caused by:  Not getting enough magnesium in your diet.  Malnutrition.  Problems with absorbing magnesium from the intestines.  Dehydration.  Alcohol abuse.  Vomiting.  Severe or chronic diarrhea.  Some medicines, including medicines that make you urinate more (diuretics).  Certain diseases, such as kidney disease, diabetes, celiac disease, and overactive thyroid. What are the signs or symptoms? Symptoms of this condition include:  Loss of appetite.  Nausea and vomiting.  Involuntary shaking or trembling of a body part (tremor).  Muscle weakness.  Tingling in the arms and legs.  Sudden tightening of muscles (muscle spasms).  Confusion.  Psychiatric issues, such as depression, irritability, or psychosis.  A feeling of fluttering of the heart.  Seizures. These symptoms are more severe if magnesium levels drop suddenly. How is this diagnosed? This condition may be diagnosed based on:  Your symptoms and medical history.  A physical exam.  Blood and urine tests. How is this treated? Treatment depends on the cause and the severity of the condition. It may be treated with:  A magnesium supplement. This can be taken in pill form. If the condition is severe, magnesium is usually given through an IV.  Changes to your diet. You may be directed to eat foods that have a lot of magnesium, such as green leafy vegetables, peas, beans, and nuts.  Stopping any intake of alcohol. Follow these instructions at home:      Make sure that your diet includes foods with magnesium. Foods that have a lot of magnesium in them  include: ? Green leafy vegetables, such as spinach and broccoli. ? Beans and peas. ? Nuts and seeds, such as almonds and sunflower seeds. ? Whole grains, such as whole grain bread and fortified cereals.  Take magnesium supplements if your health care provider tells you to do that. Take them as directed.  Take over-the-counter and prescription medicines only as told by your health care provider.  Have your magnesium levels monitored as told by your health care provider.  When you are active, drink fluids that contain electrolytes.  Avoid drinking alcohol.  Keep all follow-up visits as told by your health care provider. This is important. Contact a health care provider if:  You get worse instead of better.  Your symptoms return. Get help right away if you:  Develop severe muscle weakness.  Have trouble breathing.  Feel that your heart is racing. Summary  Hypomagnesemia is a condition in which the level of magnesium in the blood is low.  Hypomagnesemia can affect every organ in the body.  Treatment may include eating more foods that contain magnesium, taking magnesium supplements, and not drinking alcohol.  Have your magnesium levels monitored as told by your health care provider. This information is not intended to replace advice given to you by your health care provider. Make sure you discuss any questions you have with your health care provider. Document Released: 08/04/2005 Document Revised: 10/21/2017 Document Reviewed: 10/10/2017 Elsevier Patient Education  [place].  Tbo-Filgrastim injection (Granix) What is this medicine? TBO-FILGRASTIM (T B O fil GRA stim) is a granulocyte colony-stimulating factor that helps you make more neutrophils, a  type of white blood cell. Neutrophils are important for fighting infections. Some chemotherapy affects your bone marrow and lowers your neutrophils. This medicine helps decrease the length of time that neutrophils are very  low (severe neutropenia). This medicine may be used for other purposes; ask your health care provider or pharmacist if you have questions. COMMON BRAND NAME(S): Granix What should I tell my health care provider before I take this medicine? They need to know if you have any of these conditions:  bone scan or tests planned  kidney disease  sickle cell anemia  an unusual or allergic reaction to tbo-filgrastim, filgrastim, pegfilgrastim, other medicines, foods, dyes, or preservatives  pregnant or trying to get pregnant  breast-feeding How should I use this medicine? This medicine is for injection under the skin. If you get this medicine at home, you will be taught how to prepare and give this medicine. Refer to the Instructions for Use that come with your medication packaging. Use exactly as directed. Take your medicine at regular intervals. Do not take your medicine more often than directed. It is important that you put your used needles and syringes in a special sharps container. Do not put them in a trash can. If you do not have a sharps container, call your pharmacist or healthcare provider to get one. Talk to your pediatrician regarding the use of this medicine in children. While this drug may be prescribed for children as young as 34 month of age for selected conditions, precautions do apply. Overdosage: If you think you have taken too much of this medicine contact a poison control center or emergency room at once. NOTE: This medicine is only for you. Do not share this medicine with others. What if I miss a dose? It is important not to miss your dose. Call your doctor or health care professional if you miss a dose. What may interact with this medicine? This medicine may interact with the following medications:  medicines that may cause a release of neutrophils, such as lithium This list may not describe all possible interactions. Give your health care provider a list of all the  medicines, herbs, non-prescription drugs, or dietary supplements you use. Also tell them if you smoke, drink alcohol, or use illegal drugs. Some items may interact with your medicine. What should I watch for while using this medicine? You may need blood work done while you are taking this medicine. What side effects may I notice from receiving this medicine? Side effects that you should report to your doctor or health care professional as soon as possible:  allergic reactions like skin rash, itching or hives, swelling of the face, lips, or tongue  back pain  blood in the urine  dark urine  dizziness  fast heartbeat  feeling faint  shortness of breath or breathing problems  signs and symptoms of infection like fever or chills; cough; or sore throat  signs and symptoms of kidney injury like trouble passing urine or change in the amount of urine  stomach or side pain, or pain at the shoulder  sweating  swelling of the legs, ankles, or abdomen  tiredness Side effects that usually do not require medical attention (report to your doctor or health care professional if they continue or are bothersome):  bone pain  diarrhea  headache  muscle pain  vomiting This list may not describe all possible side effects. Call your doctor for medical advice about side effects. You may report side effects to FDA at 1-800-FDA-1088.  Where should I keep my medicine? Keep out of the reach of children. Store in a refrigerator between 2 and 8 degrees C (36 and 46 degrees F). Keep in carton to protect from light. Throw away this medicine if it is left out of the refrigerator for more than 5 consecutive days. Throw away any unused medicine after the expiration date. NOTE: This sheet is a summary. It may not cover all possible information. If you have questions about this medicine, talk to your doctor, pharmacist, or health care provider.  2020 Elsevier/Gold Standard (2018-09-09 19:58:39)

## 2019-05-29 NOTE — Progress Notes (Signed)
Patient admitted with FN on 05/23/19. He did receive Udenyca on 05/19/19. MD spoke with Burman Nieves, PharmD and wanted Granix to be given if ok with insurance. He is concerned with his CBC. Granix ok'd by Prior Authorization team. Patient has Medicare and Medicaid.  Hardie Pulley, PharmD, BCPS, BCOP

## 2019-05-29 NOTE — Progress Notes (Signed)
Nutrition Follow-up:  Patient with small cell lung cancer.  Had PEG reinserted on 6/25.  Noted recent hospital admission.  Spoke with patient during infusion this am.  Patient reports that he is taking 3 cartons of Two Cal and taking promod (unclear on amount).  Reports that he is eating orally as well.  Yesterday ate 100% [place], ham and cheese sandwich for lunch (6 inch sub) and last night for dinner ate 2 chicken legs with mashed potatoes and okra.  Reports that he is drinking water 4 cups daily and Mt Dew.    Reports normal bowel movement this am.      Medications: reviewed  Labs: Mag 2.5, BUN 30, creatinine 1.35  Anthropometrics:   Weight 160 lb 4.4 oz on 7.5 decreased from 161 lb on 6/30   Estimated Energy Needs  Kcals: 2130-2485 calories Protein: 106-124 g Fluid: 2.4 L  NUTRITION DIAGNOSIS: Inadequate oral intake continues being addressed with PEG feeding   INTERVENTION:  Recommend 4 cartons of Two Cal and 21ml promod TID.  Reviewed giving promod again with patient.  Patient aware that he needs to continue to drink orally as current water flushes not meeting hydration needs.     MONITORING, EVALUATION, GOAL: Patient will consume adequate calories and protein to prevent weight loss   NEXT VISIT: July 14 during infusion  Nyasia Baxley B. Zenia Resides, [place], [place] Registered Dietitian [phone] (pager)

## 2019-05-30 ENCOUNTER — Ambulatory Visit
Admission: RE | Admit: 2019-05-30 | Discharge: 2019-05-30 | Disposition: A | Discharge status: Home / Self Care | Payer: Medicare Other | Source: Ambulatory Visit | Attending: Radiation Oncology | Admitting: Radiation Oncology

## 2019-05-30 ENCOUNTER — Other Ambulatory Visit: Payer: Self-pay

## 2019-05-30 ENCOUNTER — Other Ambulatory Visit: Payer: Self-pay | Admitting: Medical Oncology

## 2019-05-30 ENCOUNTER — Telehealth: Payer: Self-pay | Admitting: *Deleted

## 2019-05-30 ENCOUNTER — Telehealth: Payer: Self-pay | Admitting: Medical Oncology

## 2019-05-30 ENCOUNTER — Telehealth: Payer: Self-pay | Admitting: Physician Assistant

## 2019-05-30 ENCOUNTER — Inpatient Hospital Stay: Payer: Medicare Other

## 2019-05-30 ENCOUNTER — Other Ambulatory Visit: Payer: Self-pay | Admitting: Radiation Oncology

## 2019-05-30 ENCOUNTER — Other Ambulatory Visit: Payer: Medicare Other

## 2019-05-30 ENCOUNTER — Ambulatory Visit: Admission: RE | Admit: 2019-05-30 | Payer: Medicare Other | Source: Ambulatory Visit

## 2019-05-30 DIAGNOSIS — D6481 Anemia due to antineoplastic chemotherapy: Secondary | ICD-10-CM | POA: Diagnosis not present

## 2019-05-30 DIAGNOSIS — M545 Low back pain: Secondary | ICD-10-CM | POA: Diagnosis not present

## 2019-05-30 DIAGNOSIS — G8929 Other chronic pain: Secondary | ICD-10-CM | POA: Diagnosis not present

## 2019-05-30 DIAGNOSIS — C3411 Malignant neoplasm of upper lobe, right bronchus or lung: Secondary | ICD-10-CM

## 2019-05-30 DIAGNOSIS — T451X5A Adverse effect of antineoplastic and immunosuppressive drugs, initial encounter: Secondary | ICD-10-CM

## 2019-05-30 DIAGNOSIS — Z5111 Encounter for antineoplastic chemotherapy: Secondary | ICD-10-CM | POA: Diagnosis not present

## 2019-05-30 DIAGNOSIS — D701 Agranulocytosis secondary to cancer chemotherapy: Secondary | ICD-10-CM

## 2019-05-30 DIAGNOSIS — D696 Thrombocytopenia, unspecified: Secondary | ICD-10-CM

## 2019-05-30 DIAGNOSIS — C787 Secondary malignant neoplasm of liver and intrahepatic bile duct: Secondary | ICD-10-CM | POA: Diagnosis not present

## 2019-05-30 DIAGNOSIS — C7951 Secondary malignant neoplasm of bone: Secondary | ICD-10-CM | POA: Diagnosis not present

## 2019-05-30 DIAGNOSIS — Z5112 Encounter for antineoplastic immunotherapy: Secondary | ICD-10-CM | POA: Diagnosis not present

## 2019-05-30 DIAGNOSIS — Z510 Encounter for antineoplastic radiation therapy: Secondary | ICD-10-CM | POA: Diagnosis not present

## 2019-05-30 DIAGNOSIS — Z79899 Other long term (current) drug therapy: Secondary | ICD-10-CM | POA: Diagnosis not present

## 2019-05-30 LAB — CBC WITH DIFFERENTIAL (CANCER CENTER ONLY)
Abs Immature Granulocytes: 0.12 10*3/uL — ABNORMAL HIGH (ref 0.00–0.07)
Basophils Absolute: 0 10*3/uL (ref 0.0–0.1)
Basophils Relative: 0 %
Eosinophils Absolute: 0 10*3/uL (ref 0.0–0.5)
Eosinophils Relative: 1 %
HCT: 25.6 % — ABNORMAL LOW (ref 39.0–52.0)
Hemoglobin: 8.3 g/dL — ABNORMAL LOW (ref 13.0–17.0)
Immature Granulocytes: 8 %
Lymphocytes Relative: 23 %
Lymphs Abs: 0.3 10*3/uL — ABNORMAL LOW (ref 0.7–4.0)
MCH: 28.2 pg (ref 26.0–34.0)
MCHC: 32.4 g/dL (ref 30.0–36.0)
MCV: 87.1 fL (ref 80.0–100.0)
Monocytes Absolute: 0.2 10*3/uL (ref 0.1–1.0)
Monocytes Relative: 14 %
Neutro Abs: 0.8 10*3/uL — ABNORMAL LOW (ref 1.7–7.7)
Neutrophils Relative %: 54 %
Platelet Count: 8 10*3/uL — CL (ref 150–400)
RBC: 2.94 MIL/uL — ABNORMAL LOW (ref 4.22–5.81)
RDW: 13.2 % (ref 11.5–15.5)
WBC Count: 1.5 10*3/uL — ABNORMAL LOW (ref 4.0–10.5)
nRBC: 0 % (ref 0.0–0.2)

## 2019-05-30 MED ORDER — SODIUM CHLORIDE 0.9% IV SOLUTION
250.0000 mL | Freq: Once | INTRAVENOUS | Status: AC
  Administered 2019-05-30: 11:00:00 250 mL via INTRAVENOUS
  Filled 2019-05-30: qty 250

## 2019-05-30 MED ORDER — TBO-FILGRASTIM 300 MCG/0.5ML ~~LOC~~ SOSY
300.0000 ug | PREFILLED_SYRINGE | Freq: Once | SUBCUTANEOUS | Status: AC
  Administered 2019-05-30: 11:00:00 300 ug via SUBCUTANEOUS

## 2019-05-30 MED ORDER — SODIUM CHLORIDE 0.9% FLUSH
10.0000 mL | INTRAVENOUS | Status: AC | PRN
  Administered 2019-05-30: 10 mL
  Filled 2019-05-30: qty 10

## 2019-05-30 MED ORDER — HEPARIN SOD (PORK) LOCK FLUSH 100 UNIT/ML IV SOLN
500.0000 [IU] | Freq: Every day | INTRAVENOUS | Status: AC | PRN
  Administered 2019-05-30: 500 [IU]
  Filled 2019-05-30: qty 5

## 2019-05-30 MED ORDER — ACETAMINOPHEN 325 MG PO TABS
ORAL_TABLET | ORAL | Status: AC
  Filled 2019-05-30: qty 2

## 2019-05-30 MED ORDER — DIPHENHYDRAMINE HCL 25 MG PO CAPS
ORAL_CAPSULE | ORAL | Status: AC
  Filled 2019-05-30: qty 1

## 2019-05-30 MED ORDER — DIPHENHYDRAMINE HCL 25 MG PO CAPS
25.0000 mg | ORAL_CAPSULE | Freq: Once | ORAL | Status: AC
  Administered 2019-05-30: 25 mg via ORAL

## 2019-05-30 MED ORDER — TBO-FILGRASTIM 300 MCG/0.5ML ~~LOC~~ SOSY
PREFILLED_SYRINGE | SUBCUTANEOUS | Status: AC
  Filled 2019-05-30: qty 0.5

## 2019-05-30 MED ORDER — ACETAMINOPHEN 325 MG PO TABS
650.0000 mg | ORAL_TABLET | Freq: Once | ORAL | Status: AC
  Administered 2019-05-30: 650 mg via ORAL

## 2019-05-30 MED ORDER — TBO-FILGRASTIM 300 MCG/0.5ML ~~LOC~~ SOSY: 300.0000 ug | PREFILLED_SYRINGE | Freq: Once | SUBCUTANEOUS | Status: DC

## 2019-05-30 NOTE — Patient Instructions (Signed)
Thrombocytopenia Thrombocytopenia means that you have a low number of platelets in your blood. Platelets are tiny cells in the blood. When you bleed, they clump together at the cut or injury to stop the bleeding. This is called blood clotting. If you do not have enough platelets, it can cause bleeding problems. Some cases of this condition are mild while others are more severe. What are the causes? This condition may be caused by:  Your body not making enough platelets. This may be caused by: ? Your bone marrow not making blood cells (aplastic anemia). ? Cancer in the bone marrow. ? Certain medicines. ? Infection in the bone marrow. ? Drinking a lot of alcohol.  Your body destroying platelets too quickly. This may be caused by: ? Certain immune diseases. ? Certain medicines. ? Certain blood clotting disorders. ? Certain disorders that are passed from parent to child (inherited). ? Certain bleeding disorders. ? Pregnancy. ? Having a spleen that is larger than normal. What are the signs or symptoms?  Bleeding that is not normal.  Nosebleeds.  Heavy menstrual periods.  Blood in the pee (urine) or poop (stool).  A purple-like color to the skin (purpura).  Bruising.  A rash that looks like pinpoint, purple-red spots (petechiae). How is this treated?  Treatment of another condition that is causing the low platelet count.  Medicines to help protect your platelets from being destroyed.  A replacement (transfusion) of platelets to stop or prevent bleeding.  Surgery to remove the spleen. Follow these instructions at home: Activity  Avoid activities that could cause you to get hurt or bruised. Follow instructions about how to prevent falls.  Take care not to cut yourself: ? When you shave. ? When you use scissors, needles, knives, or other tools.  Take care not to burn yourself: ? When you use an iron. ? When you cook. General instructions   Check your skin and the  inside of your mouth for bruises or blood as told by your doctor.  Check to see if there is blood in your spit (sputum), pee, and poop. Do this as told by your doctor.  Do not drink alcohol.  Take over-the-counter and prescription medicines only as told by your doctor.  Do not take any medicines that have aspirin or NSAIDs in them. These medicines can thin your blood and cause you to bleed.  Tell all of your doctors that you have this condition. Be sure to tell your dentist and eye doctor too. Contact a doctor if:  You have bruises and you do not know why. Get help right away if:  You are bleeding anywhere on your body.  You have blood in your spit, pee, or poop. Summary  Thrombocytopenia means that you have a low number of platelets in your blood.  Platelets are needed for blood clotting.  Symptoms of this condition include bleeding that is not normal, and bruising.  Take care not to cut or burn yourself. This information is not intended to replace advice given to you by your health care provider. Make sure you discuss any questions you have with your health care provider. Document Released: 10/28/2011 Document Revised: 08/10/2018 Document Reviewed: 08/10/2018 Elsevier Patient Education  2020 Elsevier Inc.  

## 2019-05-30 NOTE — Telephone Encounter (Signed)
CALLED PATIENT TO ASK ABOUT COMING IN FOR LABS TODAY Wednesday July 8 @ 10 AM, SPOKE WITH PATIENT AND HE IS AWARE OF THIS APPT. AND HE IS GOOD WITH THIS APPT.

## 2019-05-30 NOTE — Telephone Encounter (Signed)
Andrew Marigene Ehlers RN-Request to continue palliative care one time a week for 8 weeks. This started after hospital discharge. Palliative care is already seeing pt now due to feeding tube maintenance,  med management and pain control.  They have a rapport with him and social worker involved.   Pain management Pt went to [facility] pain clinic today and said the provider checked his ?urine /blood and found positive drug screen "opiates".  Andrew West will manage pain.

## 2019-05-30 NOTE — Telephone Encounter (Signed)
Requested drug screen results that were done today .

## 2019-05-30 NOTE — Telephone Encounter (Signed)
Spoke with the patient to inform him of concerns with his plans to go out of town this weekend.  Per his physician it is not in his best interest to go out of town with his current lab values.  I let him know that we would touch base with him tomorrow following his lab draw to make a final decision.  Patient verbalized understanding.  Will continue to follow as necessary.   Gloriajean Dell. Leonie Green, BSN

## 2019-05-30 NOTE — Progress Notes (Signed)
Pt given I unit platelets, tolerated well.  VSS.  PATIENT TOLERATED 750 ML/HR RATE FOR PLATELETS AS OF TODAY'S INFUSION.

## 2019-05-30 NOTE — Telephone Encounter (Signed)
Called the patient but was unable to reach him and his voicemail is full so I was unable to leave a message. I will try calling him tomorrow. The intent of the phone call was regarding his pain medication. There was concern today at the [facility] pain clinic over who will be taking over managing his pain medication due to his metastatic lung cancer involving the bones. I was calling to inform the patient that we can take over management of his pain management. He recently had a refill and will not be due for a refill until his follow up appointment next week. I will discuss/inform him of this at his appointment next week if I am unable to get in touch with him before then.

## 2019-05-31 ENCOUNTER — Ambulatory Visit
Admission: RE | Admit: 2019-05-31 | Discharge: 2019-05-31 | Disposition: A | Discharge status: Home / Self Care | Payer: Medicare Other | Source: Ambulatory Visit | Attending: Radiation Oncology | Admitting: Radiation Oncology

## 2019-05-31 ENCOUNTER — Ambulatory Visit: Payer: Medicare Other

## 2019-05-31 ENCOUNTER — Telehealth: Payer: Self-pay | Admitting: Medical Oncology

## 2019-05-31 ENCOUNTER — Other Ambulatory Visit: Payer: Self-pay

## 2019-05-31 ENCOUNTER — Other Ambulatory Visit: Payer: Self-pay | Admitting: Internal Medicine

## 2019-05-31 ENCOUNTER — Telehealth: Payer: Self-pay | Admitting: *Deleted

## 2019-05-31 DIAGNOSIS — C3401 Malignant neoplasm of right main bronchus: Secondary | ICD-10-CM | POA: Diagnosis not present

## 2019-05-31 DIAGNOSIS — C787 Secondary malignant neoplasm of liver and intrahepatic bile duct: Secondary | ICD-10-CM | POA: Diagnosis not present

## 2019-05-31 DIAGNOSIS — C3411 Malignant neoplasm of upper lobe, right bronchus or lung: Secondary | ICD-10-CM | POA: Diagnosis not present

## 2019-05-31 DIAGNOSIS — E43 Unspecified severe protein-calorie malnutrition: Secondary | ICD-10-CM | POA: Diagnosis not present

## 2019-05-31 DIAGNOSIS — Z431 Encounter for attention to gastrostomy: Secondary | ICD-10-CM | POA: Diagnosis not present

## 2019-05-31 DIAGNOSIS — Z510 Encounter for antineoplastic radiation therapy: Secondary | ICD-10-CM | POA: Diagnosis not present

## 2019-05-31 DIAGNOSIS — C7951 Secondary malignant neoplasm of bone: Secondary | ICD-10-CM | POA: Diagnosis not present

## 2019-05-31 LAB — CBC WITH DIFFERENTIAL (CANCER CENTER ONLY)
Abs Immature Granulocytes: 0.1 10*3/uL — ABNORMAL HIGH (ref 0.00–0.07)
Band Neutrophils: 17 %
Basophils Absolute: 0 10*3/uL (ref 0.0–0.1)
Basophils Relative: 1 %
Eosinophils Absolute: 0 10*3/uL (ref 0.0–0.5)
Eosinophils Relative: 1 %
HCT: 26.2 % — ABNORMAL LOW (ref 39.0–52.0)
Hemoglobin: 8.3 g/dL — ABNORMAL LOW (ref 13.0–17.0)
Lymphocytes Relative: 10 %
Lymphs Abs: 0.3 10*3/uL — ABNORMAL LOW (ref 0.7–4.0)
MCH: 27.9 pg (ref 26.0–34.0)
MCHC: 31.7 g/dL (ref 30.0–36.0)
MCV: 88.2 fL (ref 80.0–100.0)
Metamyelocytes Relative: 2 %
Monocytes Absolute: 0.1 10*3/uL (ref 0.1–1.0)
Monocytes Relative: 4 %
Myelocytes: 1 %
Neutro Abs: 2.8 10*3/uL (ref 1.7–17.7)
Neutrophils Relative %: 64 %
Platelet Count: 28 10*3/uL — ABNORMAL LOW (ref 150–400)
RBC: 2.97 MIL/uL — ABNORMAL LOW (ref 4.22–5.81)
RDW: 13.2 % (ref 11.5–15.5)
WBC Count: 3.4 10*3/uL — ABNORMAL LOW (ref 4.0–10.5)
nRBC: 0 % (ref 0.0–0.2)

## 2019-05-31 LAB — BPAM PLATELET PHERESIS
Blood Product Expiration Date: 202007102359
ISSUE DATE / TIME: 202007081148
Unit Type and Rh: 6200

## 2019-05-31 LAB — PREPARE PLATELET PHERESIS: Unit division: 0

## 2019-05-31 MED ORDER — FENTANYL 25 MCG/HR TD PT72: 1.0000 | MEDICATED_PATCH | TRANSDERMAL | 0 refills | Status: DC

## 2019-05-31 MED FILL — FENTANYL 25 MCG/HR PT72: 25 | 15 days supply | Qty: 5 | Fill #0

## 2019-05-31 NOTE — Telephone Encounter (Signed)
Per Julien Nordmann I instructed pt to take ibuprofen in between percocet and pick up percocet rx on Saturday.

## 2019-05-31 NOTE — Telephone Encounter (Signed)
Called patient to inform that he needs to be here @ 10 am for labs on 05-31-19 and 06-01-19, spoke with patient and he verified understanding this

## 2019-06-01 ENCOUNTER — Ambulatory Visit: Payer: Medicare Other

## 2019-06-01 ENCOUNTER — Other Ambulatory Visit: Payer: Self-pay | Admitting: Radiation Oncology

## 2019-06-04 ENCOUNTER — Ambulatory Visit: Payer: Medicare Other

## 2019-06-04 ENCOUNTER — Telehealth: Payer: Self-pay | Admitting: Internal Medicine

## 2019-06-04 DIAGNOSIS — C787 Secondary malignant neoplasm of liver and intrahepatic bile duct: Secondary | ICD-10-CM | POA: Diagnosis not present

## 2019-06-04 DIAGNOSIS — E43 Unspecified severe protein-calorie malnutrition: Secondary | ICD-10-CM | POA: Diagnosis not present

## 2019-06-04 DIAGNOSIS — C3411 Malignant neoplasm of upper lobe, right bronchus or lung: Secondary | ICD-10-CM | POA: Diagnosis not present

## 2019-06-04 DIAGNOSIS — C3401 Malignant neoplasm of right main bronchus: Secondary | ICD-10-CM | POA: Diagnosis not present

## 2019-06-04 DIAGNOSIS — Z431 Encounter for attention to gastrostomy: Secondary | ICD-10-CM | POA: Diagnosis not present

## 2019-06-04 DIAGNOSIS — C7951 Secondary malignant neoplasm of bone: Secondary | ICD-10-CM | POA: Diagnosis not present

## 2019-06-04 NOTE — Telephone Encounter (Signed)
Added injection 7/17. Chemo 7/13 thru 7/15. Not able to reach patient or leave message. Patient will get appointment at next chemo visit.

## 2019-06-05 ENCOUNTER — Other Ambulatory Visit: Payer: Self-pay

## 2019-06-05 ENCOUNTER — Inpatient Hospital Stay: Payer: Medicare Other

## 2019-06-05 ENCOUNTER — Inpatient Hospital Stay (HOSPITAL_BASED_OUTPATIENT_CLINIC_OR_DEPARTMENT_OTHER): Payer: Medicare Other | Admitting: Physician Assistant

## 2019-06-05 ENCOUNTER — Encounter: Payer: Self-pay | Admitting: Physician Assistant

## 2019-06-05 ENCOUNTER — Ambulatory Visit: Payer: Medicare Other

## 2019-06-05 VITALS — BP 113/81 | HR 95 | Temp 98.7°F | Resp 18 | Ht 69.0 in | Wt 155.9 lb

## 2019-06-05 DIAGNOSIS — Z5111 Encounter for antineoplastic chemotherapy: Secondary | ICD-10-CM | POA: Diagnosis not present

## 2019-06-05 DIAGNOSIS — J984 Other disorders of lung: Secondary | ICD-10-CM

## 2019-06-05 DIAGNOSIS — G893 Neoplasm related pain (acute) (chronic): Secondary | ICD-10-CM

## 2019-06-05 DIAGNOSIS — M545 Low back pain: Secondary | ICD-10-CM | POA: Diagnosis not present

## 2019-06-05 DIAGNOSIS — C3411 Malignant neoplasm of upper lobe, right bronchus or lung: Secondary | ICD-10-CM | POA: Diagnosis not present

## 2019-06-05 DIAGNOSIS — C787 Secondary malignant neoplasm of liver and intrahepatic bile duct: Secondary | ICD-10-CM | POA: Diagnosis not present

## 2019-06-05 DIAGNOSIS — M898X9 Other specified disorders of bone, unspecified site: Secondary | ICD-10-CM | POA: Diagnosis not present

## 2019-06-05 DIAGNOSIS — D649 Anemia, unspecified: Secondary | ICD-10-CM

## 2019-06-05 DIAGNOSIS — Z5112 Encounter for antineoplastic immunotherapy: Secondary | ICD-10-CM | POA: Diagnosis not present

## 2019-06-05 DIAGNOSIS — D6481 Anemia due to antineoplastic chemotherapy: Secondary | ICD-10-CM | POA: Diagnosis not present

## 2019-06-05 DIAGNOSIS — C7951 Secondary malignant neoplasm of bone: Secondary | ICD-10-CM

## 2019-06-05 DIAGNOSIS — E46 Unspecified protein-calorie malnutrition: Secondary | ICD-10-CM

## 2019-06-05 DIAGNOSIS — D6959 Other secondary thrombocytopenia: Secondary | ICD-10-CM | POA: Insufficient documentation

## 2019-06-05 DIAGNOSIS — T451X5A Adverse effect of antineoplastic and immunosuppressive drugs, initial encounter: Secondary | ICD-10-CM

## 2019-06-05 DIAGNOSIS — R5382 Chronic fatigue, unspecified: Secondary | ICD-10-CM

## 2019-06-05 DIAGNOSIS — D61818 Other pancytopenia: Secondary | ICD-10-CM | POA: Diagnosis not present

## 2019-06-05 LAB — CMP (CANCER CENTER ONLY)
ALT: 12 U/L (ref 0–44)
AST: 15 U/L (ref 15–41)
Albumin: 3.1 g/dL — ABNORMAL LOW (ref 3.5–5.0)
Alkaline Phosphatase: 72 U/L (ref 38–126)
Anion gap: 9 (ref 5–15)
BUN: 20 mg/dL (ref 6–20)
CO2: 30 mmol/L (ref 22–32)
Calcium: 8.6 mg/dL — ABNORMAL LOW (ref 8.9–10.3)
Chloride: 106 mmol/L (ref 98–111)
Creatinine: 1.36 mg/dL — ABNORMAL HIGH (ref 0.61–1.24)
GFR, Est AFR Am: >60 mL/min (ref >60)
GFR, Estimated: 59 mL/min — ABNORMAL LOW (ref >60)
Glucose, Bld: 101 mg/dL — ABNORMAL HIGH (ref 70–99)
Potassium: 4.4 mmol/L (ref 3.5–5.1)
Sodium: 145 mmol/L (ref 135–145)
Total Bilirubin: 0.3 mg/dL (ref 0.3–1.2)
Total Protein: 6.6 g/dL (ref 6.5–8.1)

## 2019-06-05 LAB — CBC WITH DIFFERENTIAL (CANCER CENTER ONLY)
Abs Immature Granulocytes: 0.09 10*3/uL — ABNORMAL HIGH (ref 0.00–0.07)
Basophils Absolute: 0 10*3/uL (ref 0.0–0.1)
Basophils Relative: 0 %
Eosinophils Absolute: 0 10*3/uL (ref 0.0–0.5)
Eosinophils Relative: 0 %
HCT: 24.5 % — ABNORMAL LOW (ref 39.0–52.0)
Hemoglobin: 7.9 g/dL — ABNORMAL LOW (ref 13.0–17.0)
Immature Granulocytes: 2 %
Lymphocytes Relative: 8 %
Lymphs Abs: 0.4 10*3/uL — ABNORMAL LOW (ref 0.7–4.0)
MCH: 27.8 pg (ref 26.0–34.0)
MCHC: 32.2 g/dL (ref 30.0–36.0)
MCV: 86.3 fL (ref 80.0–100.0)
Monocytes Absolute: 0.6 10*3/uL (ref 0.1–1.0)
Monocytes Relative: 14 %
Neutro Abs: 3.5 10*3/uL (ref 1.7–7.7)
Neutrophils Relative %: 76 %
Platelet Count: 23 10*3/uL — ABNORMAL LOW (ref 150–400)
RBC: 2.84 MIL/uL — ABNORMAL LOW (ref 4.22–5.81)
RDW: 13.2 % (ref 11.5–15.5)
WBC Count: 4.6 10*3/uL (ref 4.0–10.5)
nRBC: 0 % (ref 0.0–0.2)

## 2019-06-05 LAB — MAGNESIUM: Magnesium: 1.9 mg/dL (ref 1.7–2.4)

## 2019-06-05 LAB — PREPARE RBC (CROSSMATCH)

## 2019-06-05 LAB — TSH: TSH: 1.142 u[IU]/mL (ref 0.320–4.118)

## 2019-06-05 MED ORDER — PROCHLORPERAZINE MALEATE 10 MG PO TABS: 10.0000 mg | ORAL_TABLET | Freq: Four times a day (QID) | ORAL | 1 refills | Status: DC | PRN

## 2019-06-05 MED ORDER — SODIUM CHLORIDE 0.9% IV SOLUTION
250.0000 mL | Freq: Once | INTRAVENOUS | Status: AC
  Administered 2019-06-05: 250 mL via INTRAVENOUS
  Filled 2019-06-05: qty 250

## 2019-06-05 MED ORDER — DIPHENHYDRAMINE HCL 25 MG PO CAPS
ORAL_CAPSULE | ORAL | Status: AC
  Filled 2019-06-05: qty 1

## 2019-06-05 MED ORDER — LIDOCAINE-PRILOCAINE 2.5-2.5 % EX CREA: 1.0000 "application " | TOPICAL_CREAM | CUTANEOUS | 0 refills | Status: AC | PRN

## 2019-06-05 MED ORDER — HEPARIN SOD (PORK) LOCK FLUSH 100 UNIT/ML IV SOLN
500.0000 [IU] | Freq: Every day | INTRAVENOUS | Status: AC | PRN
  Administered 2019-06-05: 500 [IU]
  Filled 2019-06-05: qty 5

## 2019-06-05 MED ORDER — ACETAMINOPHEN 325 MG PO TABS
650.0000 mg | ORAL_TABLET | Freq: Once | ORAL | Status: AC
  Administered 2019-06-05: 650 mg via ORAL

## 2019-06-05 MED ORDER — DIPHENHYDRAMINE HCL 25 MG PO CAPS
25.0000 mg | ORAL_CAPSULE | Freq: Once | ORAL | Status: AC
  Administered 2019-06-05: 10:00:00 25 mg via ORAL

## 2019-06-05 MED ORDER — ACETAMINOPHEN 325 MG PO TABS
ORAL_TABLET | ORAL | Status: AC
  Filled 2019-06-05: qty 2

## 2019-06-05 MED FILL — LIDOCAINE-PRILOCAINE CREAM: 2.5-2.5 | 10 days supply | Qty: 30 | Fill #0

## 2019-06-05 MED FILL — PROCHLORPERAZINE 10 MG TAB: 10 | 7 days supply | Qty: 30 | Fill #0

## 2019-06-05 NOTE — Patient Instructions (Signed)

## 2019-06-05 NOTE — Progress Notes (Signed)
Nutrition Follow-up:  Patient with small cell lung cancer.  PEG reinserted on 6/25.  Patient receiving chemotherapy and palliative radiation thearpy to L4 region.   Met with patient during infusion.  Patient reports that he is taking 3-3.5 cartons of Two Cal daily.  Reports that he feels full and has a hard time getting in 4 th carton of Two Cal.  Reports that he takes 2 cartons at 5:30-6am and 1-1.5 cartons at supper time.  Reports that he has been taking promod what sounds like more than 48m TID.    Reports oral intake has dropped off.  Reports that he continues to drink fluids (20 oz soda, 2-3, 16 oz water, tea).    Reports sometimes he can taste formula. Reports normal bowel movement about 2 times per day.    Medications: reviewed  Labs: reviewed  Anthropometrics:   Weight decreased to 155 lb from 160 lb on 7/5   Estimated Energy Needs  Kcals: 2130-2485 calories Protein: 106-124 g Fluid: 2.4 L  NUTRITION DIAGNOSIS: Inadequate oral intake continues   INTERVENTION:  Patient reports that he is going to give 2 cartons of Two Cal at 5:30-6pm and 2 cartons at supper time on days when he is at the cancer center mid day.  On days when he is not at the cancer center will give 2 cartons of Two Cal at 5:30-6am and 1 carton at noon and 1 carton at supper time.  Will give promod 360mTID between feedings (paper measuring cup given to patient and reviewed amount that he should be giving).  Recommend he increase water flush to 12053mefore and after each feeding.  Reminded patient that he still needs to drink fluid orally to meet hydration needs.   Discussed spreading feeding out to help with reflux but patient did not want to do that at this time.    Tube feeding regimen provides 2200 calories,109 g protein and 1684-1924m58mee water.   Encouraged patient to continue to eat what he can orally for added nutrition     MONITORING, EVALUATION, GOAL: Patient will consume adequate calories and  protein to prevent weight loss   NEXT VISIT: Tuesday, July 28  Malli Falotico B. AlleZenia Resides, [place] Dietitian [phone])

## 2019-06-05 NOTE — Progress Notes (Signed)
[place] OFFICE PROGRESS NOTE  [name], MD [address] [place] [address]  DIAGNOSIS: Initially diagnosed as limited stage (T3, N3,M0)small cell lung cancer. He presented with right paratracheal mass in addition to right suprahilar mass and lymphadenopathy as well as right cervical lymph node diagnosed in October 2019. He showed evidence of metastatic disease to the spine, liver, as well as suspicious pleural based metastasis in June 2020.   PRIOR THERAPY: Systemic chemotherapy with cisplatin 80 mg/M2 on day 1 and etoposide 100 mg/M2 on days 1, 2 and 3 every 3 weeks. Status post 3 cycles. This will be concurrent with radiotherapy with the start of cycle #2. Starting from cycle #4 his dose of cisplatin would be 60 mg/M2 and etoposide 90 mg/M2. Last dose was given November 23, 2018.  CURRENT THERAPY: 1) Systemic chemotherapy with carboplatin for an AUC of 5 on days 1 and etoposide 100 mg/m2 on days 1,2, and 3 and Imfinzi 1500 mg IV every 3 weeks with Neulasta support. First dose on 05/14/2019. Status post 1 cycle.  2) Palliative radiotherapy to the L4 vertebral body region under the care of Dr. Lisbeth Renshaw. Last dose expected on 06/15/2019  INTERVAL HISTORY: Andrew West 53 y.o. male returns to the clinic for a follow-up visit.  After the last cycle of treatment, the patient developed significant pancytopenia. He received Neulasta on 05/19/2019 following his first cycle of treatment.  He presented to the emergency room on 05/23/2019 for the chief complaint of fever, hypotension, and shortness of breath.  He was treated with broad-spectrum antibiotics.  No source of infection was found.  He also had a PRBC transfusion and a platelet transfusion.  After discharge, the patient had repeat labs performed at the cancer center which showed persistent pancytopenia and hypomagnesemia.  He received Granix x2, another platelet transfusion, and IV magnesium.  Today the patient is  feeling fair without any concerning complaints except for persistent low back pain for which he takes Percocet and whole body bone pain secondary to his granix. He is receiving palliative radiotherapy to the L4 region due to his metastatic bone disease under the care of Dr. Lisbeth Renshaw. He denies any more fever, chills, night sweats.  He had his PEG tube reinserted again on 05/17/2019. He continues to lose weight despite using 3.5 cans of nutrition daily. He also states he tries to stay hydrated. His goal is to use 4 cans of nutrition a day. He denies any chest pain, shortness of breath, cough, or hemoptysis.  He denies any nausea, vomiting, diarrhea, or constipation. He denies any known bleeding or bruising including melena, hematochezia, epistaxis, or hematuria. He denies any headache or visual changes.  He is here today for evaluation before starting cycle #2.   MEDICAL HISTORY: Past Medical History:  Diagnosis Date  . Anxiety   . CAD (coronary artery disease)    a. s/p multiple PCIs with last cath 11/2016 with severe multivessel CAD, s/p PCTA to LCx but unable to pass stent  . Cancer ([place])   . Chronic leg pain    bilateral  . Chronic lower back pain   . COPD (chronic obstructive pulmonary disease) ([place])   . Depression   . GERD (gastroesophageal reflux disease)    Takes Dexilant  . HLD (hyperlipidemia)   . Hypertension   . Rhabdomyolysis    h/o, r/t statins  . Sleep apnea    "can't tolerate mask" (12/16/2016)  . Type II diabetes mellitus ([place])  ALLERGIES:  is allergic to iohexol.  MEDICATIONS:  Current Outpatient Medications  Medication Sig Dispense Refill  . Accu-Chek FastClix Lancets MISC Use as directed to test blood sugar once daily. DX E11.9 102 each 12  . acetaminophen (TYLENOL) 500 MG tablet Take 1,000 mg by mouth every 6 (six) hours as needed for moderate pain.    . Blood Glucose Monitoring Suppl (ACCU-CHEK GUIDE) w/Device KIT 1 each by Does not apply route daily. Use as directed  to test blood sugar once daily. DX E11.9 1 kit 0  . fentaNYL (DURAGESIC) 25 MCG/HR Place 1 patch onto the skin every 3 (three) days. 5 patch 0  . glucose blood (ACCU-CHEK GUIDE) test strip Use as directed to test blood sugar once daily. DX E11.9 100 each 12  . LORazepam (ATIVAN) 0.5 MG tablet 1 tab po q 4-6 hours prn or 1 tab po 30 minutes prior to radiation (Patient taking differently: Take 0.5 mg by mouth every 6 (six) hours as needed for anxiety or sedation. 1 tab po q 4-6 hours prn or 1 tab po 30 minutes prior to radiation) 30 tablet 0  . mirtazapine (REMERON) 30 MG tablet Take 1 tablet (30 mg total) by mouth at bedtime. 30 tablet 3  . nebivolol (BYSTOLIC) 5 MG tablet Take 1 tablet (5 mg total) by mouth daily. 90 tablet 3  . nitroGLYCERIN (NITROSTAT) 0.4 MG SL tablet Place 1 tablet (0.4 mg total) under the tongue every 5 (five) minutes as needed for chest pain. 25 tablet 2  . pantoprazole (PROTONIX) 40 MG tablet Take 1 tablet (40 mg total) by mouth daily. 90 tablet 1  . ranolazine (RANEXA) 1000 MG SR tablet Take 1 tablet (1,000 mg total) by mouth 2 (two) times daily. 180 tablet 3  . rosuvastatin (CRESTOR) 20 MG tablet Take 1 tablet (20 mg total) by mouth daily. 90 tablet 1  . sucralfate (CARAFATE) 1 g tablet Take 1 tablet by mouth 4 (four) times daily -  before meals and at bedtime.  0  . tiotropium (SPIRIVA HANDIHALER) 18 MCG inhalation capsule Place 1 capsule (18 mcg total) into inhaler and inhale daily. 90 capsule 1  . vitamin B-12 (CYANOCOBALAMIN) 500 MCG tablet Take 1 tablet (500 mcg total) by mouth daily. 30 tablet 1  . zolpidem (AMBIEN) 5 MG tablet Take 1 tablet (5 mg total) by mouth at bedtime. 30 tablet 3  . lidocaine-prilocaine (EMLA) cream Apply 1 application topically as needed. 30 g 0  . prochlorperazine (COMPAZINE) 10 MG tablet Take 1 tablet (10 mg total) by mouth every 6 (six) hours as needed for nausea or vomiting. 30 tablet 1   No current facility-administered medications for  this visit.    Facility-Administered Medications Ordered in Other Visits  Medication Dose Route Frequency Provider Last Rate Last Dose  . acetaminophen (TYLENOL) tablet 650 mg  650 mg Oral Once [name], [name], PA-C      . diphenhydrAMINE (BENADRYL) capsule 25 mg  25 mg Oral Once [name], [name], PA-C      . heparin lock flush 100 unit/mL  500 Units Intracatheter Daily PRN [name], [name], PA-C        SURGICAL HISTORY:  Past Surgical History:  Procedure Laterality Date  . BACK SURGERY    . BRONCHIAL BIOPSY  08/25/2018   Procedure: BRONCHIAL BIOPSIES;  Surgeon: Garner Nash, DO;  Location: [facility];  Service: Cardiopulmonary;;  . CARDIAC CATHETERIZATION N/A 09/25/2015   Procedure: Left Heart Cath and Coronary  Angiography;  Surgeon: Leonie Man, MD;  Location: [facility];  Service: Cardiovascular;  Laterality: N/A;  . CARDIAC CATHETERIZATION N/A 12/16/2016   Procedure: Left Heart Cath and Coronary Angiography;  Surgeon: Leonie Man, MD;  Location: [facility];  Service: Cardiovascular;  Laterality: N/A;  . CARDIAC CATHETERIZATION N/A 12/16/2016   Procedure: Coronary Balloon Angioplasty;  Surgeon: Leonie Man, MD;  Location: [facility];  Service: Cardiovascular;  Laterality: N/A;  . COLONOSCOPY W/ POLYPECTOMY    . CORONARY ANGIOPLASTY  09/25/2015   mid cir & om  . CORONARY ANGIOPLASTY WITH STENT PLACEMENT  10/09/2001   PTCA & stenting of mid AV circumflex; 2.5x37m Pixel stent  . CORONARY ANGIOPLASTY WITH STENT PLACEMENT  12/13/2001   PCI with stent to mid L circumflex, 95% stenosis to 0% residual  . CORONARY ANGIOPLASTY WITH STENT PLACEMENT  10/10/2003   PCI to mid AV circumflex; LAD 30% disease; RCA 100% occluded prox.  . CORONARY ANGIOPLASTY WITH STENT PLACEMENT  09/01/2011   PCI with stenting with bare metal stent to mid AV groove circumflex and PDA  . CORONARY ANGIOPLASTY WITH STENT PLACEMENT  10/17/2011    cutting balloon angioplasty of ostial lateral OM1 branch and bifurcation AV groove circumflex OM junction; stenosis reduced to 0%  . ENDOBRONCHIAL ULTRASOUND Bilateral 08/25/2018   Procedure: ENDOBRONCHIAL ULTRASOUND;  Surgeon: IGarner Nash DO;  Location: [facility];  Service: Cardiopulmonary;  Laterality: Bilateral;  . ESOPHAGOGASTRODUODENOSCOPY (EGD) WITH PROPOFOL N/A 12/08/2018   Procedure: ESOPHAGOGASTRODUODENOSCOPY (EGD) WITH PROPOFOL;  Surgeon: JMilus Banister MD;  Location: [facility];  Service: Endoscopy;  Laterality: N/A;  . EXCISIONAL HEMORRHOIDECTOMY    . FINE NEEDLE ASPIRATION  08/25/2018   Procedure: FINE NEEDLE ASPIRATION;  Surgeon: IGarner Nash DO;  Location: [facility];  Service: Cardiopulmonary;;  . FLEXIBLE BRONCHOSCOPY  08/25/2018   Procedure: FLEXIBLE BRONCHOSCOPY;  Surgeon: IGarner Nash DO;  Location: [facility];  Service: Cardiopulmonary;;  . IR GASTROSTOMY TUBE MOD SED  12/11/2018  . IR GASTROSTOMY TUBE MOD SED  05/17/2019  . IR GASTROSTOMY TUBE REMOVAL  03/22/2019  . IR IMAGING GUIDED PORT INSERTION  09/29/2018  . LEFT HEART CATHETERIZATION WITH CORONARY ANGIOGRAM N/A 10/18/2011   Procedure: LEFT HEART CATHETERIZATION WITH CORONARY ANGIOGRAM;  Surgeon: DLeonie Man MD;  [facility];  Service: Cardiovascular;  Laterality: N/A;  . LUMBAR LAMINECTOMY/DECOMPRESSION MICRODISCECTOMY  03/31/2012   Procedure: LUMBAR LAMINECTOMY/DECOMPRESSION MICRODISCECTOMY 1 LEVEL;  Surgeon: HCharlie Pitter MD;  [facility];  Service: Neurosurgery;  Laterality: Left;  . TRANSTHORACIC ECHOCARDIOGRAM  07/28/2011   EF 55-65%; LVH, grade 1 diastolic dysfunction;     REVIEW OF SYSTEMS:   Review of Systems  Constitutional: Positive for fatigue, weight loss, and decreased appetite. Negative for  chills and fever. HENT:   Negative for mouth sores, nosebleeds, sore throat and trouble swallowing.   Eyes: Negative for eye problems and icterus.  Respiratory:  Negative for cough, hemoptysis, shortness of breath and wheezing.   Cardiovascular: Negative for chest pain and leg swelling.  Gastrointestinal: Negative for abdominal pain, melena, constipation, diarrhea, nausea and vomiting.  Genitourinary: Negative for bladder incontinence, difficulty urinating, dysuria, frequency and hematuria.   Musculoskeletal: Positive for chronic back pain. Negative for gait problem, neck pain and neck stiffness.  Skin: Negative for itching and rash.  Neurological: Negative for dizziness, extremity weakness, gait problem, headaches, light-headedness and seizures.  Hematological: Negative for adenopathy. Does not bruise/bleed easily.  Psychiatric/Behavioral:  Negative for confusion, depression and sleep disturbance. The patient is not nervous/anxious.     PHYSICAL EXAMINATION:  Blood pressure 113/81, pulse 95, temperature 98.7 F (37.1 C), temperature source Oral, resp. rate 18, height _0  (1.753 m), weight 155 lb 14.4 oz (70.7 kg), SpO2 100 %.  ECOG PERFORMANCE STATUS: 1 - Symptomatic but completely ambulatory  Physical Exam  Constitutional: Oriented to person, place, and time and well-developed, well-nourished, and in no distress.  HENT:  Head: Normocephalic and atraumatic.  Mouth/Throat: Oropharynx is clear and moist. No oropharyngeal exudate.  Eyes: Conjunctivae are normal. Right eye exhibits no discharge. Left eye exhibits no discharge. No scleral icterus.  Neck: Normal range of motion. Neck supple.  Cardiovascular: Normal rate, regular rhythm, normal heart sounds and intact distal pulses.   Pulmonary/Chest: Effort normal and breath sounds normal. No respiratory distress. No wheezes. No rales.  Abdominal: Soft. Bowel sounds are normal. Exhibits no distension and no mass. There is no tenderness.  Musculoskeletal: Normal range of motion. Exhibits no edema.  Lymphadenopathy:    No cervical adenopathy.  Neurological: Alert and oriented to person, place, and  time. Exhibits normal muscle tone. Gait normal. Coordination normal.  Skin: Skin is warm and dry. No rash noted. Not diaphoretic. No erythema. No pallor.  Psychiatric: Mood, memory and judgment normal.  Vitals reviewed.  LABORATORY DATA: Lab Results  Component Value Date   WBC 4.6 06/05/2019   HGB 7.9 (L) 06/05/2019   HCT 24.5 (L) 06/05/2019   MCV 86.3 06/05/2019   PLT 23 (L) 06/05/2019      Chemistry      Component Value Date/Time   NA 145 06/05/2019 0840   NA 144 07/25/2018 0949   K 4.4 06/05/2019 0840   CL 106 06/05/2019 0840   CO2 30 06/05/2019 0840   BUN 20 06/05/2019 0840   BUN 10 07/25/2018 0949   CREATININE 1.36 (H) 06/05/2019 0840   CREATININE 0.96 12/13/2016 1151      Component Value Date/Time   CALCIUM 8.6 (L) 06/05/2019 0840   ALKPHOS 72 06/05/2019 0840   AST 15 06/05/2019 0840   ALT 12 06/05/2019 0840   BILITOT 0.3 06/05/2019 0840       RADIOGRAPHIC STUDIES:  Mr Jeri Cos MG Contrast  Result Date: 05/21/2019 CLINICAL DATA:  53 year old male with small cell lung cancer, status post prophylactic all brain radiation completing on 02/27/2019. Restaging. EXAM: MRI HEAD WITHOUT AND WITH CONTRAST TECHNIQUE: Multiplanar, multiecho pulse sequences of the brain and surrounding structures were obtained without and with intravenous contrast. CONTRAST:  7 milliliters Gadavist COMPARISON:  Brain MRI 01/25/2019 and earlier. FINDINGS: Brain: No abnormal enhancement identified. No midline shift, mass effect, or evidence of intracranial mass lesion. No dural thickening. No restricted diffusion to suggest acute infarction. No ventriculomegaly, extra-axial collection or acute intracranial hemorrhage. Cervicomedullary junction and pituitary are within normal limits. Stable cerebral volume. Stable minimal nonspecific cerebral white matter T2 and FLAIR hyperintensity. No cortical encephalomalacia or chronic blood products. Vascular: Major intracranial vascular flow voids Are stable. The  distal left vertebral artery appears mildly dominant. The major dural venous sinuses are enhancing and appear to be patent. Skull and upper cervical spine: Negative visible cervical spine and spinal cord. Visualized bone marrow signal is within normal limits. Sinuses/Orbits: Negative orbits. Stable small right maxillary sinus mucous retention cyst. Other: Mastoids remain clear. Visible internal auditory structures appear normal. Stable scalp and face soft tissues. IMPRESSION: No metastatic disease or acute intracranial abnormality identified status post whole  brain radiation. Electronically Signed   By: Genevie Ann M.D.   On: 05/21/2019 20:40   Ir Gastrostomy Tube  Result Date: 05/17/2019 INDICATION: 53 year old with metastatic small cell lung cancer. Patient has malnutrition and poor oral intake. Prior gastrostomy tube was removed earlier in the year. EXAM: PERCUTANEOUS GASTROSTOMY TUBE WITH FLUOROSCOPIC GUIDANCE Physician: Stephan Minister. Anselm Pancoast, MD MEDICATIONS: Ancef 2 g; Antibiotics were administered within 1 hour of the procedure. ANESTHESIA/SEDATION: Versed 2.0 mg IV; Fentanyl 100 mcg IV Moderate Sedation Time:  34 minutes The patient was continuously monitored during the procedure by the interventional radiology nurse under my direct supervision. FLUOROSCOPY TIME:  Fluoroscopy Time: 2 minutes, 48 seconds, 11 mGy COMPLICATIONS: None immediate. PROCEDURE: The procedure was explained to the patient. The risks and benefits of the procedure were discussed and the patient's questions were addressed. Informed consent was obtained from the patient. Transverse colon contains gas and stool and identified with fluoroscopy. The patient was placed on the interventional table. An orogastric tube was placed with fluoroscopic guidance. The anterior abdomen was prepped and draped in sterile fashion. Maximal barrier sterile technique was utilized including caps, mask, sterile gowns, sterile gloves, sterile drape, hand hygiene and skin  antiseptic. Stomach was inflated with air through the orogastric tube. The skin and subcutaneous tissues were anesthetized with 1% lidocaine. Incision was made at the old gastrostomy tube site. A 17 gauge needle was directed into the distended stomach with fluoroscopic guidance. A stiff Amplatz wire was advanced into the stomach. A 9-French vascular sheath was placed and the orogastric tube was snared using a Gooseneck snare device. The orogastric tube and snare were pulled out of the patient's mouth. The snare device was connected to a 24-French gastrostomy tube. The snare device and gastrostomy tube were pulled through the patient's mouth and out the anterior abdominal wall. The gastrostomy tube was cut to an appropriate length. Air was injected through gastrostomy tube and confirmed placement within the stomach. Fluoroscopic images were obtained for documentation. The gastrostomy tube was flushed with normal saline. IMPRESSION: Successful fluoroscopic guided percutaneous gastrostomy tube placement. Electronically Signed   By: Markus Daft M.D.   On: 05/17/2019 16:14   Dg Chest Portable 1 View  Result Date: 05/23/2019 CLINICAL DATA:  Fever and weakness EXAM: PORTABLE CHEST 1 VIEW COMPARISON:  05/03/2019 FINDINGS: Cardiac shadow is stable. Left-sided chest wall port is again noted and stable. The lungs are well aerated without focal infiltrate or sizable effusion. Some scarring is noted in the right upper lobe. No focal infiltrate is seen. No effusion is noted. IMPRESSION: No acute abnormality seen. Electronically Signed   By: Inez Catalina M.D.   On: 05/23/2019 18:54     ASSESSMENT/PLAN:  This is a very pleasant 53 year old African-American male who was initially diagnosed with limited stage small cell lung cancer.  He presented with a right paratracheal and a right suprahilar mass with right cervical lymphadenopathy.  He was diagnosed in October 2019.   He previously went systemic chemotherapy in addition  to radiation.  He was admitted to the hospital for almost every cycle of chemotherapy with significant pancytopenia. His last dose was completed on 11/23/2018.  He also experienced dysphasia and odynophagia which required a PEG tube for nutrition.  His PEG tube was removed on 03/22/2019.  He has been on observation until June of 2020 until routine imaging showed new development of hepatic and osseous metastases.  He is currently undergoing treatment again with carboplatin for an AUC 5 on day 1,  etoposide 100 mg/m on days 1, 2, and 3 and Imfinzi 1500 mg IV every 3 weeks with Neulasta support.  The patient was hospitalized after cycle #1 for pancytopenia and fever.  He was treated with IV antibiotics, platelet transfusion blood transfusion, and Granix as an outpatient.  The patient was seen today with Dr. Julien Nordmann.  Labs were reviewed with the patient. The patient's labs continue to show thrombocytopenia with a platelet count of 23,000. We will delay the patient's treatment by one week. We will recheck his labs next week. The patient denies any active bleeding at this time. We would consider a platelet transfusion if the patient developed active bleeding.    The patient's labs also demonstrate anemia with a hemoglobin of 7.9. I will arrange for the patient to receive one unit of blood while in the clinic today.   We will see the patient back for follow-up visit in one weeks for evaluation and to review his labs.   The patient recently had his PEG tube reinserted for nutrition.   For pain management, we will take over the patient's pain management due to his recent development of metastatic disease involving the L4 vertebral body. The patient does not need a refill of percocet at this time. He will continue to take percocet every 6 hours as needed for pain.   I will send a refill for compazine and his EMLA cream to his pharmacy.   The patient was advised to call immediately if he has any concerning  symptoms in the interval. The patient voices understanding of current disease status and treatment options and is in agreement with the current care plan. All questions were answered. The patient knows to call the clinic with any problems, questions or concerns. We can certainly see the patient much sooner if necessary    Orders Placed This Encounter  Procedures  . Practitioner attestation of consent    I, the ordering practitioner, attest that I have discussed with the patient the benefits, risks, side effects, alternatives, likelihood of achieving goals and potential problems during recovery for the procedure listed.    Standing Status:   Future    Standing Expiration Date:   06/04/2020    Order Specific Question:   Procedure    Answer:   Blood Product(s)  . Complete patient signature process for consent form    Standing Status:   Future    Standing Expiration Date:   06/04/2020  . Care order/instruction    Transfuse Parameters    Standing Status:   Future    Standing Expiration Date:   06/04/2020  . Type and screen    Standing Status:   Future    Number of Occurrences:   1    Standing Expiration Date:   06/04/2020     [name] [name], PA-C 06/05/19  ADDENDUM: Hematology/Oncology Attending: I had a face-to-face encounter with the patient today.  I recommended his care plan.  This is a very pleasant 53 years old African-American male with metastatic small cell lung cancer.  He is currently on systemic chemotherapy with carboplatin, etoposide and Imfinzi status post 1 cycle.  The patient has a rough time with the first cycle of his treatment with prolonged and persistent pancytopenia.  He required PRBCs and platelet transfusion several times. He was supposed to start cycle #2 today but his platelets count are still very low at 23,000. I recommended for the patient to delay the start of cycle #2 by at least 1  week until improvement of his platelets count. For the  chemotherapy-induced anemia, I will arrange for the patient to receive 2 units of PRBCs transfusion today. For the malnutrition, the patient has a PEG tube placed and he is currently on supplemental nutrition.  I recommended for him to increase his nutritional supplement as tolerated. We will see him back for follow-up visit in 1 week for evaluation before resuming his treatment but I would consider reducing the dose of carboplatin to AUC of 3 and 2 etoposide 80 mg/M2 starting from cycle #2 because of the significant adverse effect and pancytopenia with the first cycle. The patient was advised to call immediately if he has any concerning symptoms in the interval.  Disclaimer: This note was dictated with voice recognition software. Similar sounding words can inadvertently be transcribed and may be missed upon review. Eilleen Kempf, MD 06/05/19

## 2019-06-05 NOTE — Patient Instructions (Signed)

## 2019-06-06 ENCOUNTER — Inpatient Hospital Stay: Payer: Medicare Other

## 2019-06-06 ENCOUNTER — Other Ambulatory Visit: Payer: Medicare Other

## 2019-06-06 ENCOUNTER — Ambulatory Visit: Payer: Medicare Other

## 2019-06-06 ENCOUNTER — Telehealth: Payer: Self-pay | Admitting: Internal Medicine

## 2019-06-06 ENCOUNTER — Other Ambulatory Visit: Payer: Self-pay

## 2019-06-06 ENCOUNTER — Ambulatory Visit: Payer: Medicare Other | Admitting: Physician Assistant

## 2019-06-06 ENCOUNTER — Ambulatory Visit
Admission: RE | Admit: 2019-06-06 | Discharge: 2019-06-06 | Disposition: A | Discharge status: Home / Self Care | Payer: Medicare Other | Source: Ambulatory Visit | Attending: Radiation Oncology | Admitting: Radiation Oncology

## 2019-06-06 DIAGNOSIS — Z510 Encounter for antineoplastic radiation therapy: Secondary | ICD-10-CM | POA: Diagnosis not present

## 2019-06-06 DIAGNOSIS — C3411 Malignant neoplasm of upper lobe, right bronchus or lung: Secondary | ICD-10-CM | POA: Diagnosis not present

## 2019-06-06 LAB — TYPE AND SCREEN
ABO/RH(D): A POS
Antibody Screen: NEGATIVE
Unit division: 0

## 2019-06-06 LAB — BPAM RBC
Blood Product Expiration Date: 202008082359
ISSUE DATE / TIME: 202007141105
Unit Type and Rh: 6200

## 2019-06-06 NOTE — Progress Notes (Signed)
Per Cassie [name] PA-C, today's treatment canceled. Patient notified.

## 2019-06-06 NOTE — Telephone Encounter (Signed)
Called pt per 7/14 los - pt did not come up from radiation to pick up new schedule.   I called pt and he is aware of the update and states he will get an updated schedule tomorrow after radiation.  New schedule left at front desk   .

## 2019-06-07 ENCOUNTER — Other Ambulatory Visit: Payer: Self-pay

## 2019-06-07 ENCOUNTER — Ambulatory Visit: Payer: Medicare Other

## 2019-06-07 ENCOUNTER — Telehealth: Payer: Self-pay | Admitting: *Deleted

## 2019-06-07 ENCOUNTER — Ambulatory Visit
Admission: RE | Admit: 2019-06-07 | Discharge: 2019-06-07 | Disposition: A | Discharge status: Home / Self Care | Payer: Medicare Other | Source: Ambulatory Visit | Attending: Radiation Oncology | Admitting: Radiation Oncology

## 2019-06-07 ENCOUNTER — Other Ambulatory Visit: Payer: Self-pay | Admitting: Internal Medicine

## 2019-06-07 DIAGNOSIS — Z510 Encounter for antineoplastic radiation therapy: Secondary | ICD-10-CM | POA: Diagnosis not present

## 2019-06-07 DIAGNOSIS — C3411 Malignant neoplasm of upper lobe, right bronchus or lung: Secondary | ICD-10-CM | POA: Diagnosis not present

## 2019-06-07 MED ORDER — OXYCODONE-ACETAMINOPHEN 5-325 MG PO TABS: 1.0000 | ORAL_TABLET | Freq: Three times a day (TID) | ORAL | 0 refills | Status: DC | PRN

## 2019-06-07 MED FILL — OXYCODONE-ACETAMINOPHEN 5-3: 5-325 | 15 days supply | Qty: 45 | Fill #0

## 2019-06-07 NOTE — Telephone Encounter (Signed)
Note continued: Pt is also taking hydrocodone 5/325mg  1 po q6 hrs. Pt is requesting a change to his pain medication as the current meds are not taking the pain away. Pt previously referred to the [facility] pain clinic and advised to [facility] RN "  he will not go back as he was treated disrespectfully." Will review with MD for additional recommendations.

## 2019-06-07 NOTE — Telephone Encounter (Signed)
All from Amy at [facility], pt called her with concerns Fentanyl 64mcg patch is not helping, pt is also taking

## 2019-06-07 NOTE — Telephone Encounter (Signed)
Reviewed with MD pt concerns about pain meds, MD gave instructions for pt to apply 2- 69mcg  Fentanyl patches skin for a total of 53mcg, Rx for Oxycodone 5/325 has been sent ot pt pharmacy. Pt verbalized understanding. Advised pt to call when he is out of Patches.

## 2019-06-08 ENCOUNTER — Ambulatory Visit
Admission: RE | Admit: 2019-06-08 | Discharge: 2019-06-08 | Disposition: A | Discharge status: Home / Self Care | Payer: Medicare Other | Source: Ambulatory Visit | Attending: Radiation Oncology | Admitting: Radiation Oncology

## 2019-06-08 ENCOUNTER — Ambulatory Visit: Payer: Medicare Other

## 2019-06-08 ENCOUNTER — Other Ambulatory Visit: Payer: Self-pay

## 2019-06-08 DIAGNOSIS — C3411 Malignant neoplasm of upper lobe, right bronchus or lung: Secondary | ICD-10-CM | POA: Diagnosis not present

## 2019-06-08 DIAGNOSIS — Z510 Encounter for antineoplastic radiation therapy: Secondary | ICD-10-CM | POA: Diagnosis not present

## 2019-06-11 ENCOUNTER — Other Ambulatory Visit: Payer: Self-pay

## 2019-06-11 ENCOUNTER — Ambulatory Visit: Payer: Medicare Other

## 2019-06-11 ENCOUNTER — Ambulatory Visit
Admission: RE | Admit: 2019-06-11 | Discharge: 2019-06-11 | Disposition: A | Discharge status: Home / Self Care | Payer: Medicare Other | Source: Ambulatory Visit | Attending: Radiation Oncology | Admitting: Radiation Oncology

## 2019-06-11 DIAGNOSIS — C3411 Malignant neoplasm of upper lobe, right bronchus or lung: Secondary | ICD-10-CM | POA: Diagnosis not present

## 2019-06-11 DIAGNOSIS — C7951 Secondary malignant neoplasm of bone: Secondary | ICD-10-CM | POA: Diagnosis not present

## 2019-06-11 DIAGNOSIS — Z510 Encounter for antineoplastic radiation therapy: Secondary | ICD-10-CM | POA: Diagnosis not present

## 2019-06-12 ENCOUNTER — Ambulatory Visit: Payer: Medicare Other

## 2019-06-12 ENCOUNTER — Telehealth: Payer: Self-pay | Admitting: Family Medicine

## 2019-06-12 ENCOUNTER — Other Ambulatory Visit: Payer: Self-pay

## 2019-06-12 ENCOUNTER — Telehealth: Payer: Self-pay | Admitting: Adult Health Nurse Practitioner

## 2019-06-12 ENCOUNTER — Other Ambulatory Visit: Payer: Medicare Other

## 2019-06-12 ENCOUNTER — Ambulatory Visit
Admission: RE | Admit: 2019-06-12 | Discharge: 2019-06-12 | Disposition: A | Discharge status: Home / Self Care | Payer: Medicare Other | Source: Ambulatory Visit | Attending: Radiation Oncology | Admitting: Radiation Oncology

## 2019-06-12 DIAGNOSIS — Z510 Encounter for antineoplastic radiation therapy: Secondary | ICD-10-CM | POA: Diagnosis not present

## 2019-06-12 DIAGNOSIS — Z431 Encounter for attention to gastrostomy: Secondary | ICD-10-CM | POA: Diagnosis not present

## 2019-06-12 DIAGNOSIS — C3401 Malignant neoplasm of right main bronchus: Secondary | ICD-10-CM | POA: Diagnosis not present

## 2019-06-12 DIAGNOSIS — E43 Unspecified severe protein-calorie malnutrition: Secondary | ICD-10-CM | POA: Diagnosis not present

## 2019-06-12 DIAGNOSIS — C7951 Secondary malignant neoplasm of bone: Secondary | ICD-10-CM | POA: Diagnosis not present

## 2019-06-12 DIAGNOSIS — C787 Secondary malignant neoplasm of liver and intrahepatic bile duct: Secondary | ICD-10-CM | POA: Diagnosis not present

## 2019-06-12 DIAGNOSIS — C3411 Malignant neoplasm of upper lobe, right bronchus or lung: Secondary | ICD-10-CM | POA: Diagnosis not present

## 2019-06-12 NOTE — Telephone Encounter (Signed)
Amy called wanting to see about the pt  getting a glucometer.Marland Kitchenand also when he was at the hospital there was no mention of metformin..please follow up

## 2019-06-12 NOTE — Telephone Encounter (Signed)
Spoke with patient and discussed Palliative services and he was in agreement with this.  We have scheduled a Telephone Palliative Consult for 06/15/19 @ 9 AM

## 2019-06-13 ENCOUNTER — Ambulatory Visit
Admission: RE | Admit: 2019-06-13 | Discharge: 2019-06-13 | Disposition: A | Discharge status: Home / Self Care | Payer: Medicare Other | Source: Ambulatory Visit | Attending: Radiation Oncology | Admitting: Radiation Oncology

## 2019-06-13 ENCOUNTER — Other Ambulatory Visit: Payer: Medicare Other

## 2019-06-13 ENCOUNTER — Inpatient Hospital Stay (HOSPITAL_BASED_OUTPATIENT_CLINIC_OR_DEPARTMENT_OTHER): Payer: Medicare Other | Admitting: Physician Assistant

## 2019-06-13 ENCOUNTER — Inpatient Hospital Stay: Payer: Medicare Other

## 2019-06-13 ENCOUNTER — Telehealth: Payer: Self-pay | Admitting: Physician Assistant

## 2019-06-13 ENCOUNTER — Encounter: Payer: Self-pay | Admitting: Physician Assistant

## 2019-06-13 ENCOUNTER — Other Ambulatory Visit: Payer: Self-pay

## 2019-06-13 VITALS — BP 103/84 | HR 95 | Temp 98.9°F | Resp 18 | Ht 69.0 in | Wt 150.6 lb

## 2019-06-13 DIAGNOSIS — Z5111 Encounter for antineoplastic chemotherapy: Secondary | ICD-10-CM | POA: Diagnosis not present

## 2019-06-13 DIAGNOSIS — E440 Moderate protein-calorie malnutrition: Secondary | ICD-10-CM

## 2019-06-13 DIAGNOSIS — Z931 Gastrostomy status: Secondary | ICD-10-CM

## 2019-06-13 DIAGNOSIS — C3411 Malignant neoplasm of upper lobe, right bronchus or lung: Secondary | ICD-10-CM

## 2019-06-13 DIAGNOSIS — C787 Secondary malignant neoplasm of liver and intrahepatic bile duct: Secondary | ICD-10-CM | POA: Diagnosis not present

## 2019-06-13 DIAGNOSIS — D6481 Anemia due to antineoplastic chemotherapy: Secondary | ICD-10-CM | POA: Diagnosis not present

## 2019-06-13 DIAGNOSIS — G893 Neoplasm related pain (acute) (chronic): Secondary | ICD-10-CM

## 2019-06-13 DIAGNOSIS — R5383 Other fatigue: Secondary | ICD-10-CM

## 2019-06-13 DIAGNOSIS — D61818 Other pancytopenia: Secondary | ICD-10-CM

## 2019-06-13 DIAGNOSIS — D6959 Other secondary thrombocytopenia: Secondary | ICD-10-CM

## 2019-06-13 DIAGNOSIS — R634 Abnormal weight loss: Secondary | ICD-10-CM | POA: Diagnosis not present

## 2019-06-13 DIAGNOSIS — Z5112 Encounter for antineoplastic immunotherapy: Secondary | ICD-10-CM | POA: Diagnosis not present

## 2019-06-13 DIAGNOSIS — Z510 Encounter for antineoplastic radiation therapy: Secondary | ICD-10-CM | POA: Diagnosis not present

## 2019-06-13 DIAGNOSIS — D61810 Antineoplastic chemotherapy induced pancytopenia: Secondary | ICD-10-CM

## 2019-06-13 DIAGNOSIS — T451X5A Adverse effect of antineoplastic and immunosuppressive drugs, initial encounter: Secondary | ICD-10-CM

## 2019-06-13 DIAGNOSIS — Z95828 Presence of other vascular implants and grafts: Secondary | ICD-10-CM

## 2019-06-13 DIAGNOSIS — C7951 Secondary malignant neoplasm of bone: Secondary | ICD-10-CM | POA: Diagnosis not present

## 2019-06-13 LAB — CBC WITH DIFFERENTIAL (CANCER CENTER ONLY)
Abs Immature Granulocytes: 0.03 10*3/uL (ref 0.00–0.07)
Basophils Absolute: 0 10*3/uL (ref 0.0–0.1)
Basophils Relative: 0 %
Eosinophils Absolute: 0 10*3/uL (ref 0.0–0.5)
Eosinophils Relative: 0 %
HCT: 29.2 % — ABNORMAL LOW (ref 39.0–52.0)
Hemoglobin: 9.5 g/dL — ABNORMAL LOW (ref 13.0–17.0)
Immature Granulocytes: 1 %
Lymphocytes Relative: 5 %
Lymphs Abs: 0.3 10*3/uL — ABNORMAL LOW (ref 0.7–4.0)
MCH: 28.8 pg (ref 26.0–34.0)
MCHC: 32.5 g/dL (ref 30.0–36.0)
MCV: 88.5 fL (ref 80.0–100.0)
Monocytes Absolute: 0.9 10*3/uL (ref 0.1–1.0)
Monocytes Relative: 16 %
Neutro Abs: 4.4 10*3/uL (ref 1.7–7.7)
Neutrophils Relative %: 78 %
Platelet Count: 115 10*3/uL — ABNORMAL LOW (ref 150–400)
RBC: 3.3 MIL/uL — ABNORMAL LOW (ref 4.22–5.81)
RDW: 13.6 % (ref 11.5–15.5)
WBC Count: 5.7 10*3/uL (ref 4.0–10.5)
nRBC: 0 % (ref 0.0–0.2)

## 2019-06-13 LAB — CMP (CANCER CENTER ONLY)
ALT: 11 U/L (ref 0–44)
AST: 17 U/L (ref 15–41)
Albumin: 3.4 g/dL — ABNORMAL LOW (ref 3.5–5.0)
Alkaline Phosphatase: 71 U/L (ref 38–126)
Anion gap: 11 (ref 5–15)
BUN: 25 mg/dL — ABNORMAL HIGH (ref 6–20)
CO2: 28 mmol/L (ref 22–32)
Calcium: 9.3 mg/dL (ref 8.9–10.3)
Chloride: 107 mmol/L (ref 98–111)
Creatinine: 1.44 mg/dL — ABNORMAL HIGH (ref 0.61–1.24)
GFR, Est AFR Am: >60 mL/min (ref >60)
GFR, Estimated: 55 mL/min — ABNORMAL LOW (ref >60)
Glucose, Bld: 82 mg/dL (ref 70–99)
Potassium: 4.2 mmol/L (ref 3.5–5.1)
Sodium: 146 mmol/L — ABNORMAL HIGH (ref 135–145)
Total Bilirubin: 0.3 mg/dL (ref 0.3–1.2)
Total Protein: 7.2 g/dL (ref 6.5–8.1)

## 2019-06-13 LAB — MAGNESIUM: Magnesium: 2 mg/dL (ref 1.7–2.4)

## 2019-06-13 MED ORDER — SODIUM CHLORIDE 0.9 % IV SOLN
Freq: Once | INTRAVENOUS | Status: AC
  Administered 2019-06-13: 11:00:00 via INTRAVENOUS
  Filled 2019-06-13: qty 5

## 2019-06-13 MED ORDER — SODIUM CHLORIDE 0.9 % IV SOLN
Freq: Once | INTRAVENOUS | Status: AC
  Administered 2019-06-13: 10:00:00 via INTRAVENOUS
  Filled 2019-06-13: qty 250

## 2019-06-13 MED ORDER — SODIUM CHLORIDE 0.9 % IV SOLN
80.0000 mg/m2 | Freq: Once | INTRAVENOUS | Status: AC
  Administered 2019-06-13: 13:00:00 150 mg via INTRAVENOUS
  Filled 2019-06-13: qty 7.5

## 2019-06-13 MED ORDER — PALONOSETRON HCL INJECTION 0.25 MG/5ML
0.2500 mg | Freq: Once | INTRAVENOUS | Status: AC
  Administered 2019-06-13: 0.25 mg via INTRAVENOUS

## 2019-06-13 MED ORDER — PALONOSETRON HCL INJECTION 0.25 MG/5ML
INTRAVENOUS | Status: AC
  Filled 2019-06-13: qty 5

## 2019-06-13 MED ORDER — SODIUM CHLORIDE 0.9 % IV SOLN
1500.0000 mg | Freq: Once | INTRAVENOUS | Status: AC
  Administered 2019-06-13: 11:00:00 1500 mg via INTRAVENOUS
  Filled 2019-06-13: qty 30

## 2019-06-13 MED ORDER — SODIUM CHLORIDE 0.9% FLUSH
10.0000 mL | INTRAVENOUS | Status: DC | PRN
  Administered 2019-06-13: 10 mL
  Filled 2019-06-13: qty 10

## 2019-06-13 MED ORDER — HEPARIN SOD (PORK) LOCK FLUSH 100 UNIT/ML IV SOLN
500.0000 [IU] | Freq: Once | INTRAVENOUS | Status: AC | PRN
  Administered 2019-06-13: 14:00:00 500 [IU]
  Filled 2019-06-13: qty 5

## 2019-06-13 MED ORDER — SODIUM CHLORIDE 0.9% FLUSH
10.0000 mL | INTRAVENOUS | Status: DC | PRN
  Administered 2019-06-13: 14:00:00 10 mL
  Filled 2019-06-13: qty 10

## 2019-06-13 MED ORDER — SODIUM CHLORIDE 0.9 % IV SOLN
270.9000 mg | Freq: Once | INTRAVENOUS | Status: AC
  Administered 2019-06-13: 270 mg via INTRAVENOUS
  Filled 2019-06-13: qty 27

## 2019-06-13 NOTE — Progress Notes (Signed)
PT has numerous treatments this week and requested to stay accessed.

## 2019-06-13 NOTE — Telephone Encounter (Signed)
appts already scheduled per 7/22 LOS -

## 2019-06-13 NOTE — Progress Notes (Signed)
[facility] OFFICE PROGRESS NOTE  [name], MD [address] [place] [address]  DIAGNOSIS: Initially diagnosed as limited stage (T3, N3,M0)small cell lung cancer. Hepresented with right paratracheal mass in addition to right suprahilar mass and lymphadenopathy as well as right cervical lymph node diagnosed in October 2019. He showed evidence of metastatic disease to the spine, liver, as well as suspicious pleural based metastasis in June 2020  PRIOR THERAPY: Systemic chemotherapy with cisplatin 80 mg/M2 on day 1 and etoposide 100 mg/M2 on days 1, 2 and 3 every 3 weeks. Status post 3 cycles. This will be concurrent with radiotherapy with the start of cycle #2. Starting from cycle #4 his dose of cisplatin would be 60 mg/M2 and etoposide 90 mg/M2. Last dose was given November 23, 2018.  CURRENT THERAPY: 1) Systemic chemotherapy with carboplatin for an AUC of 5 on days 1 and etoposide 100 mg/m2 on days 1,2, and 3 and Imfinzi 1500 mg IV every 3 weeks with Neulasta support. First dose on 05/14/2019. Status post 1 cycle.  2) Palliative radiotherapy to the L4 vertebral body region under the care of Dr. Lisbeth Renshaw. Last dose expected on 06/15/2019  INTERVAL HISTORY: Andrew West 53 y.o. male returns to the clinic for a follow-up visit. The patient did not receive treatment last week due to thrombocytopenia and anemia. The patient experiences significant pancytopenia secondary to his treatment. His future doses of chemotherapy will be reduced. Since his appointment last week,  the patient also received 1 unit of packed RBCs which has not sufficiently improved his energy levels.  He still endorses fatigue and a poor night sleep secondary to pain.  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  He is currently receiving palliative radiation to this lesion under the care of Dr.  Lisbeth Renshaw.   He continues to lose weight despite using 3.5 cans of nutrition daily. He had his PEG tube reinserted again on 05/17/2019. He lost an additional 5 pounds since his appointment last week.   He also states he tries to stay hydrated. His goal is to use 4 cans of nutrition a day.  The patient recently met with the nutritionist here at the cancer center to review strategies to increase his calorie intake.  Besides fatigue and pain, patient denies any fever, chills, or night sweats.  He denies any nausea, vomiting, diarrhea, or constipation.  He denies any chest pain, shortness of breath, cough, or hemoptysis.  He denies any headache or visual changes. The patient stated that he felt too tired to get treatment today and inquired about delaying treatment until tomorrow. The patient is here today for evaluation before starting cycle #2.   MEDICAL HISTORY: Past Medical History:  Diagnosis Date  . Anxiety   . CAD (coronary artery disease)    a. s/p multiple PCIs with last cath 11/2016 with severe multivessel CAD, s/p PCTA to LCx but unable to pass stent  . Cancer ([facility])   . Chronic leg pain    bilateral  . Chronic lower back pain   . COPD (chronic obstructive pulmonary disease) ([place])   . Depression   . GERD (gastroesophageal reflux disease)    Takes Dexilant  . HLD (hyperlipidemia)   . Hypertension   . Rhabdomyolysis    h/o, r/t statins  . Sleep apnea    "can't tolerate mask" (12/16/2016)  .  Type II diabetes mellitus ([facility])     ALLERGIES:  is allergic to iohexol.  MEDICATIONS:  Current Outpatient Medications  Medication Sig Dispense Refill  . Accu-Chek FastClix Lancets MISC Use as directed to test blood sugar once daily. DX E11.9 102 each 12  . acetaminophen (TYLENOL) 500 MG tablet Take 1,000 mg by mouth every 6 (six) hours as needed for moderate pain.    . Blood Glucose Monitoring Suppl (ACCU-CHEK GUIDE) w/Device KIT 1 each by Does not apply route daily. Use as directed to test blood  sugar once daily. DX E11.9 1 kit 0  . fentaNYL (DURAGESIC) 25 MCG/HR Place 1 patch onto the skin every 3 (three) days. 5 patch 0  . glucose blood (ACCU-CHEK GUIDE) test strip Use as directed to test blood sugar once daily. DX E11.9 100 each 12  . lidocaine-prilocaine (EMLA) cream Apply 1 application topically as needed. 30 g 0  . LORazepam (ATIVAN) 0.5 MG tablet 1 tab po q 4-6 hours prn or 1 tab po 30 minutes prior to radiation (Patient taking differently: Take 0.5 mg by mouth every 6 (six) hours as needed for anxiety or sedation. 1 tab po q 4-6 hours prn or 1 tab po 30 minutes prior to radiation) 30 tablet 0  . mirtazapine (REMERON) 30 MG tablet Take 1 tablet (30 mg total) by mouth at bedtime. 30 tablet 3  . nebivolol (BYSTOLIC) 5 MG tablet Take 1 tablet (5 mg total) by mouth daily. 90 tablet 3  . oxyCODONE-acetaminophen (PERCOCET/ROXICET) 5-325 MG tablet Take 1 tablet by mouth every 8 (eight) hours as needed for severe pain. 45 tablet 0  . pantoprazole (PROTONIX) 40 MG tablet Take 1 tablet (40 mg total) by mouth daily. 90 tablet 1  . ranolazine (RANEXA) 1000 MG SR tablet Take 1 tablet (1,000 mg total) by mouth 2 (two) times daily. 180 tablet 3  . rosuvastatin (CRESTOR) 20 MG tablet Take 1 tablet (20 mg total) by mouth daily. 90 tablet 1  . sucralfate (CARAFATE) 1 g tablet Take 1 tablet by mouth 4 (four) times daily -  before meals and at bedtime.  0  . tiotropium (SPIRIVA HANDIHALER) 18 MCG inhalation capsule Place 1 capsule (18 mcg total) into inhaler and inhale daily. 90 capsule 1  . vitamin B-12 (CYANOCOBALAMIN) 500 MCG tablet Take 1 tablet (500 mcg total) by mouth daily. 30 tablet 1  . zolpidem (AMBIEN) 5 MG tablet Take 1 tablet (5 mg total) by mouth at bedtime. 30 tablet 3  . nitroGLYCERIN (NITROSTAT) 0.4 MG SL tablet Place 1 tablet (0.4 mg total) under the tongue every 5 (five) minutes as needed for chest pain. (Patient not taking: Reported on 06/13/2019) 25 tablet 2  . prochlorperazine  (COMPAZINE) 10 MG tablet Take 1 tablet (10 mg total) by mouth every 6 (six) hours as needed for nausea or vomiting. (Patient not taking: Reported on 06/13/2019) 30 tablet 1   No current facility-administered medications for this visit.    Facility-Administered Medications Ordered in Other Visits  Medication Dose Route Frequency Provider Last Rate Last Dose  . CARBOplatin (PARAPLATIN) 270 mg in sodium chloride 0.9 % 250 mL chemo infusion  270 mg Intravenous Once Curt Bears, MD      . durvalumab [facility]) 1,500 mg in sodium chloride 0.9 % 100 mL chemo infusion  1,500 mg Intravenous Once Curt Bears, MD 130 mL/hr at 06/13/19 1102 1,500 mg at 06/13/19 1102  . etoposide (VEPESID) 150 mg in sodium chloride 0.9 % 500 mL  chemo infusion  80 mg/m2 (Treatment Plan Recorded) Intravenous Once Curt Bears, MD      . heparin lock flush 100 unit/mL  500 Units Intracatheter Once PRN Curt Bears, MD      . sodium chloride flush (NS) 0.9 % injection 10 mL  10 mL Intracatheter PRN Curt Bears, MD        SURGICAL HISTORY:  Past Surgical History:  Procedure Laterality Date  . BACK SURGERY    . BRONCHIAL BIOPSY  08/25/2018   Procedure: BRONCHIAL BIOPSIES;  Surgeon: Garner Nash, DO;  Location: [facility];  Service: Cardiopulmonary;;  . CARDIAC CATHETERIZATION N/A 09/25/2015   Procedure: Left Heart Cath and Coronary Angiography;  Surgeon: Leonie Man, MD;  Location: [facility];  Service: Cardiovascular;  Laterality: N/A;  . CARDIAC CATHETERIZATION N/A 12/16/2016   Procedure: Left Heart Cath and Coronary Angiography;  Surgeon: Leonie Man, MD;  Location: [facility];  Service: Cardiovascular;  Laterality: N/A;  . CARDIAC CATHETERIZATION N/A 12/16/2016   Procedure: Coronary Balloon Angioplasty;  Surgeon: Leonie Man, MD;  Location: [facility];  Service: Cardiovascular;  Laterality: N/A;  . COLONOSCOPY W/ POLYPECTOMY    . CORONARY ANGIOPLASTY  09/25/2015    mid cir & om  . CORONARY ANGIOPLASTY WITH STENT PLACEMENT  10/09/2001   PTCA & stenting of mid AV circumflex; 2.5x93m Pixel stent  . CORONARY ANGIOPLASTY WITH STENT PLACEMENT  12/13/2001   PCI with stent to mid L circumflex, 95% stenosis to 0% residual  . CORONARY ANGIOPLASTY WITH STENT PLACEMENT  10/10/2003   PCI to mid AV circumflex; LAD 30% disease; RCA 100% occluded prox.  . CORONARY ANGIOPLASTY WITH STENT PLACEMENT  09/01/2011   PCI with stenting with bare metal stent to mid AV groove circumflex and PDA  . CORONARY ANGIOPLASTY WITH STENT PLACEMENT  10/17/2011   cutting balloon angioplasty of ostial lateral OM1 branch and bifurcation AV groove circumflex OM junction; stenosis reduced to 0%  . ENDOBRONCHIAL ULTRASOUND Bilateral 08/25/2018   Procedure: ENDOBRONCHIAL ULTRASOUND;  Surgeon: IGarner Nash DO;  [facility];  Service: Cardiopulmonary;  Laterality: Bilateral;  . ESOPHAGOGASTRODUODENOSCOPY (EGD) WITH PROPOFOL N/A 12/08/2018   Procedure: ESOPHAGOGASTRODUODENOSCOPY (EGD) WITH PROPOFOL;  Surgeon: JMilus Banister MD;  Location: [facility];  Service: Endoscopy;  Laterality: N/A;  . EXCISIONAL HEMORRHOIDECTOMY    . FINE NEEDLE ASPIRATION  08/25/2018   Procedure: FINE NEEDLE ASPIRATION;  Surgeon: IGarner Nash DO;  [facility]: [facility];  Service: Cardiopulmonary;;  . FLEXIBLE BRONCHOSCOPY  08/25/2018   Procedure: FLEXIBLE BRONCHOSCOPY;  Surgeon: IGarner Nash DO;  [facility];  Service: Cardiopulmonary;;  . IR GASTROSTOMY TUBE MOD SED  12/11/2018  . IR GASTROSTOMY TUBE MOD SED  05/17/2019  . IR GASTROSTOMY TUBE REMOVAL  03/22/2019  . IR IMAGING GUIDED PORT INSERTION  09/29/2018  . LEFT HEART CATHETERIZATION WITH CORONARY ANGIOGRAM N/A 10/18/2011   Procedure: LEFT HEART CATHETERIZATION WITH CORONARY ANGIOGRAM;  Surgeon: DLeonie Man MD;  [facility];  Service: Cardiovascular;  Laterality: N/A;  . LUMBAR LAMINECTOMY/DECOMPRESSION  MICRODISCECTOMY  03/31/2012   Procedure: LUMBAR LAMINECTOMY/DECOMPRESSION MICRODISCECTOMY 1 LEVEL;  Surgeon: HCharlie Pitter MD;  [facility];  Service: Neurosurgery;  Laterality: Left;  . TRANSTHORACIC ECHOCARDIOGRAM  07/28/2011   EF 55-65%; LVH, grade 1 diastolic dysfunction;     REVIEW OF SYSTEMS:   Review of Systems  Constitutional: Positive for fatigue, weight loss, and poor appetite. Negative for chills and  fever  HENT: Negative for mouth sores, nosebleeds, sore throat and trouble swallowing.   Eyes: Negative for eye problems and icterus.  Respiratory: Negative for cough, hemoptysis, shortness of breath and wheezing.   Cardiovascular: Negative for chest pain and leg swelling.  Gastrointestinal: Negative for abdominal pain, constipation, diarrhea, nausea and vomiting.  Genitourinary: Negative for bladder incontinence, difficulty urinating, dysuria, frequency and hematuria.   Musculoskeletal: Positive for back pain. Negative for gait problem, neck pain and neck stiffness.  Skin: Negative for itching and rash.  Neurological: Negative for dizziness, extremity weakness, gait problem, headaches, light-headedness and seizures.  Hematological: Negative for adenopathy. Does not bruise/bleed easily.  Psychiatric/Behavioral: Negative for confusion, depression and sleep disturbance. The patient is not nervous/anxious.     PHYSICAL EXAMINATION:  Blood pressure 103/84, pulse 95, temperature 98.9 F (37.2 C), temperature source Temporal, resp. rate 18, height _0  (1.753 m), weight 150 lb 9.6 oz (68.3 kg), SpO2 100 %.  ECOG PERFORMANCE STATUS: 1 - Symptomatic but completely ambulatory  Physical Exam  Constitutional: Oriented to person, place, and time and chronically ill appearing male and in no distress.  HENT:  Head: Normocephalic and atraumatic.  Mouth/Throat: Oropharynx is clear and moist. No oropharyngeal exudate.  Eyes: Conjunctivae are normal. Right eye exhibits no discharge.  Left eye exhibits no discharge. No scleral icterus.  Neck: Normal range of motion. Neck supple.  Cardiovascular: Normal rate, regular rhythm, normal heart sounds and intact distal pulses.   Pulmonary/Chest: Effort normal and breath sounds normal. No respiratory distress. No wheezes. No rales.  Abdominal: Soft. Bowel sounds are normal. Exhibits no distension and no mass. There is no tenderness.  Musculoskeletal: Normal range of motion. Exhibits no edema.  Lymphadenopathy:    No cervical adenopathy.  Neurological: Alert and oriented to person, place, and time. Exhibits normal muscle tone. Gait normal. Coordination normal.  Skin: Skin is warm and dry. No rash noted. Not diaphoretic. No erythema. No pallor.  Psychiatric: Mood, memory and judgment normal.  Vitals reviewed.  LABORATORY DATA: Lab Results  Component Value Date   WBC 5.7 06/13/2019   HGB 9.5 (L) 06/13/2019   HCT 29.2 (L) 06/13/2019   MCV 88.5 06/13/2019   PLT 115 (L) 06/13/2019      Chemistry      Component Value Date/Time   NA 146 (H) 06/13/2019 0907   NA 144 07/25/2018 0949   K 4.2 06/13/2019 0907   CL 107 06/13/2019 0907   CO2 28 06/13/2019 0907   BUN 25 (H) 06/13/2019 0907   BUN 10 07/25/2018 0949   CREATININE 1.44 (H) 06/13/2019 0907   CREATININE 0.96 12/13/2016 1151      Component Value Date/Time   CALCIUM 9.3 06/13/2019 0907   ALKPHOS 71 06/13/2019 0907   AST 17 06/13/2019 0907   ALT 11 06/13/2019 0907   BILITOT 0.3 06/13/2019 3710       RADIOGRAPHIC STUDIES:  Mr Jeri Cos GY Contrast  Result Date: 05/21/2019 CLINICAL DATA:  53 year old male with small cell lung cancer, status post prophylactic all brain radiation completing on 02/27/2019. Restaging. EXAM: MRI HEAD WITHOUT AND WITH CONTRAST TECHNIQUE: Multiplanar, multiecho pulse sequences of the brain and surrounding structures were obtained without and with intravenous contrast. CONTRAST:  7 milliliters Gadavist COMPARISON:  Brain MRI 01/25/2019 and  earlier. FINDINGS: Brain: No abnormal enhancement identified. No midline shift, mass effect, or evidence of intracranial mass lesion. No dural thickening. No restricted diffusion to suggest acute infarction. No ventriculomegaly, extra-axial collection or acute intracranial  hemorrhage. Cervicomedullary junction and pituitary are within normal limits. Stable cerebral volume. Stable minimal nonspecific cerebral white matter T2 and FLAIR hyperintensity. No cortical encephalomalacia or chronic blood products. Vascular: Major intracranial vascular flow voids Are stable. The distal left vertebral artery appears mildly dominant. The major dural venous sinuses are enhancing and appear to be patent. Skull and upper cervical spine: Negative visible cervical spine and spinal cord. Visualized bone marrow signal is within normal limits. Sinuses/Orbits: Negative orbits. Stable small right maxillary sinus mucous retention cyst. Other: Mastoids remain clear. Visible internal auditory structures appear normal. Stable scalp and face soft tissues. IMPRESSION: No metastatic disease or acute intracranial abnormality identified status post whole brain radiation. Electronically Signed   By: Genevie Ann M.D.   On: 05/21/2019 20:40   Ir Gastrostomy Tube  Result Date: 05/17/2019 INDICATION: 53 year old with metastatic small cell lung cancer. Patient has malnutrition and poor oral intake. Prior gastrostomy tube was removed earlier in the year. EXAM: PERCUTANEOUS GASTROSTOMY TUBE WITH FLUOROSCOPIC GUIDANCE Physician: Stephan Minister. Anselm Pancoast, MD MEDICATIONS: Ancef 2 g; Antibiotics were administered within 1 hour of the procedure. ANESTHESIA/SEDATION: Versed 2.0 mg IV; Fentanyl 100 mcg IV Moderate Sedation Time:  34 minutes The patient was continuously monitored during the procedure by the interventional radiology nurse under my direct supervision. FLUOROSCOPY TIME:  Fluoroscopy Time: 2 minutes, 48 seconds, 11 mGy COMPLICATIONS: None immediate. PROCEDURE:  The procedure was explained to the patient. The risks and benefits of the procedure were discussed and the patient's questions were addressed. Informed consent was obtained from the patient. Transverse colon contains gas and stool and identified with fluoroscopy. The patient was placed on the interventional table. An orogastric tube was placed with fluoroscopic guidance. The anterior abdomen was prepped and draped in sterile fashion. Maximal barrier sterile technique was utilized including caps, mask, sterile gowns, sterile gloves, sterile drape, hand hygiene and skin antiseptic. Stomach was inflated with air through the orogastric tube. The skin and subcutaneous tissues were anesthetized with 1% lidocaine. Incision was made at the old gastrostomy tube site. A 17 gauge needle was directed into the distended stomach with fluoroscopic guidance. A stiff Amplatz wire was advanced into the stomach. A 9-French vascular sheath was placed and the orogastric tube was snared using a Gooseneck snare device. The orogastric tube and snare were pulled out of the patient's mouth. The snare device was connected to a 24-French gastrostomy tube. The snare device and gastrostomy tube were pulled through the patient's mouth and out the anterior abdominal wall. The gastrostomy tube was cut to an appropriate length. Air was injected through gastrostomy tube and confirmed placement within the stomach. Fluoroscopic images were obtained for documentation. The gastrostomy tube was flushed with normal saline. IMPRESSION: Successful fluoroscopic guided percutaneous gastrostomy tube placement. Electronically Signed   By: Markus Daft M.D.   On: 05/17/2019 16:14   Dg Chest Portable 1 View  Result Date: 05/23/2019 CLINICAL DATA:  Fever and weakness EXAM: PORTABLE CHEST 1 VIEW COMPARISON:  05/03/2019 FINDINGS: Cardiac shadow is stable. Left-sided chest wall port is again noted and stable. The lungs are well aerated without focal infiltrate or  sizable effusion. Some scarring is noted in the right upper lobe. No focal infiltrate is seen. No effusion is noted. IMPRESSION: No acute abnormality seen. Electronically Signed   By: Inez Catalina M.D.   On: 05/23/2019 18:54     ASSESSMENT/PLAN:  This is a very pleasant 53 year old African-American male who was initially diagnosed with limited stage small cell lung cancer.  He presented with a right paratracheal and a right suprahilar mass with right cervical lymphadenopathy.He was diagnosed in October 2019.   He previously went systemic chemotherapy in addition to radiation. He was admitted to the hospital for almost every cycle of chemotherapy with significant pancytopenia. His last dose was completed on 11/23/2018. He also experienced dysphasia and odynophagia which required a PEG tube for nutrition.His PEG tube was removed on 03/22/2019. He has been on observation until June of 2020 until routine imaging showed newdevelopment of hepatic and osseous metastases.  He is currently undergoing treatment again with carboplatin for an AUC 5 on day 1, etoposide 100 mg/m on days 1, 2, and 3 and Imfinzi 1500 mg IV every 3 weeks with Neulasta support.  The patient was hospitalized after cycle #1 for pancytopenia and fever.  He was treated with IV antibiotics, platelet transfusion blood transfusion, and Granix as an outpatient. His dose will be reduced going forward to carboplatin for an AUC of 3, etoposide 80 mg/m2, and Imfinzi 1500 mg IV every 3 weeks.  The patient was seen with Dr. Julien Nordmann today. Labs were reviewed. His labs are adequate for treatment. We recommend that he proceed with cycle #2 today as scheduled.   The patient is scheduled for a restaging CT scan of the chest on 06/26/2019.   We will see the patient back for a follow up visit in 3 weeks for evaluation and to review his scan results before starting cycle #3.   The patient will continue to try to implement the strategies outlined by  nutrition to maintain/gain weight.   He will continue with his current pain regimen which consists of two 25 mcg Fentanyl patches and percocet.   The patient was advised to call immediately if he has any concerning symptoms in the interval. The patient voices understanding of current disease status and treatment options and is in agreement with the current care plan. All questions were answered. The patient knows to call the clinic with any problems, questions or concerns. We can certainly see the patient much sooner if necessary   No orders of the defined types were placed in this encounter.    Zygmund Passero L Nevaen Tredway, PA-C 06/13/19  ADDENDUM: Hematology/Oncology Attending: I had a face-to-face encounter with the patient today.  I recommended his care plan.  This is a very pleasant 53 years old African-American male with recurrent small cell lung cancer presented as extensive stage and he is currently on systemic chemotherapy with carboplatin, etoposide and Imfinzi status post 1 cycle.  He has a rough time with the first cycle of the chemotherapy with significant pancytopenia requiring PRBCs and platelet transfusion.  The start of cycle #2 was delayed by 1 week because of the pancytopenia. His CBC is better today.  I recommended for the patient to resume his treatment but with a reduced dose of carboplatin for AUC of 3 and 2 etoposide 80 mg/M2 with a standard dose of Imfinzi. The patient will continue his current palliative radiotherapy. For pain management he will continue his current treatment with fentanyl patch and Percocet. I will see the patient back for follow-up visit in 3 weeks for evaluation before starting cycle #3. The patient was advised to call immediately if he has any concerning symptoms in the interval. Disclaimer: This note was dictated with voice recognition software. Similar sounding words can inadvertently be transcribed and may be missed upon review. Eilleen Kempf, MD 06/13/19

## 2019-06-13 NOTE — Patient Instructions (Signed)
[facility] Discharge Instructions for Patients Receiving Chemotherapy  Today you received the following chemotherapy agents:  Imfinzi, Carboplatin, and Etoposide.   To help prevent nausea and vomiting after your treatment, we encourage you to take your nausea medication as directed.   If you develop nausea and vomiting that is not controlled by your nausea medication, call the clinic.   BELOW ARE SYMPTOMS THAT SHOULD BE REPORTED IMMEDIATELY:  *FEVER GREATER THAN 100.5 F  *CHILLS WITH OR WITHOUT FEVER  NAUSEA AND VOMITING THAT IS NOT CONTROLLED WITH YOUR NAUSEA MEDICATION  *UNUSUAL SHORTNESS OF BREATH  *UNUSUAL BRUISING OR BLEEDING  TENDERNESS IN MOUTH AND THROAT WITH OR WITHOUT PRESENCE OF ULCERS  *URINARY PROBLEMS  *BOWEL PROBLEMS  UNUSUAL RASH Items with * indicate a potential emergency and should be followed up as soon as possible.  Feel free to call the clinic should you have any questions or concerns. The clinic phone number is [phone].  Please show the [facility] at check-in to the Emergency Department and triage nurse.

## 2019-06-13 NOTE — Patient Instructions (Signed)

## 2019-06-13 NOTE — Telephone Encounter (Signed)
Patient has meter and strips sent to [facility] on [address] on 05/23/2019

## 2019-06-14 ENCOUNTER — Ambulatory Visit: Payer: Medicare Other

## 2019-06-14 ENCOUNTER — Ambulatory Visit
Admission: RE | Admit: 2019-06-14 | Discharge: 2019-06-14 | Disposition: A | Discharge status: Home / Self Care | Payer: Medicare Other | Source: Ambulatory Visit | Attending: Radiation Oncology | Admitting: Radiation Oncology

## 2019-06-14 ENCOUNTER — Other Ambulatory Visit: Payer: Self-pay

## 2019-06-14 ENCOUNTER — Inpatient Hospital Stay: Payer: Medicare Other

## 2019-06-14 VITALS — BP 108/79 | HR 87 | Temp 97.5°F | Resp 18

## 2019-06-14 DIAGNOSIS — C3411 Malignant neoplasm of upper lobe, right bronchus or lung: Secondary | ICD-10-CM

## 2019-06-14 DIAGNOSIS — D709 Neutropenia, unspecified: Secondary | ICD-10-CM | POA: Diagnosis not present

## 2019-06-14 DIAGNOSIS — H449 Unspecified disorder of globe: Secondary | ICD-10-CM | POA: Diagnosis not present

## 2019-06-14 DIAGNOSIS — J449 Chronic obstructive pulmonary disease, unspecified: Secondary | ICD-10-CM | POA: Diagnosis not present

## 2019-06-14 DIAGNOSIS — A419 Sepsis, unspecified organism: Secondary | ICD-10-CM | POA: Diagnosis not present

## 2019-06-14 DIAGNOSIS — E43 Unspecified severe protein-calorie malnutrition: Secondary | ICD-10-CM | POA: Diagnosis not present

## 2019-06-14 DIAGNOSIS — Z87891 Personal history of nicotine dependence: Secondary | ICD-10-CM | POA: Diagnosis not present

## 2019-06-14 DIAGNOSIS — Z431 Encounter for attention to gastrostomy: Secondary | ICD-10-CM | POA: Diagnosis not present

## 2019-06-14 DIAGNOSIS — E1142 Type 2 diabetes mellitus with diabetic polyneuropathy: Secondary | ICD-10-CM | POA: Diagnosis not present

## 2019-06-14 DIAGNOSIS — I130 Hypertensive heart and chronic kidney disease with heart failure and stage 1 through stage 4 chronic kidney disease, or unspecified chronic kidney disease: Secondary | ICD-10-CM | POA: Diagnosis not present

## 2019-06-14 DIAGNOSIS — F329 Major depressive disorder, single episode, unspecified: Secondary | ICD-10-CM | POA: Diagnosis not present

## 2019-06-14 DIAGNOSIS — Z7982 Long term (current) use of aspirin: Secondary | ICD-10-CM | POA: Diagnosis not present

## 2019-06-14 DIAGNOSIS — E785 Hyperlipidemia, unspecified: Secondary | ICD-10-CM | POA: Diagnosis not present

## 2019-06-14 DIAGNOSIS — D6481 Anemia due to antineoplastic chemotherapy: Secondary | ICD-10-CM | POA: Diagnosis not present

## 2019-06-14 DIAGNOSIS — Z5111 Encounter for antineoplastic chemotherapy: Secondary | ICD-10-CM | POA: Diagnosis not present

## 2019-06-14 DIAGNOSIS — Z79891 Long term (current) use of opiate analgesic: Secondary | ICD-10-CM | POA: Diagnosis not present

## 2019-06-14 DIAGNOSIS — Z510 Encounter for antineoplastic radiation therapy: Secondary | ICD-10-CM | POA: Diagnosis not present

## 2019-06-14 DIAGNOSIS — N183 Chronic kidney disease, stage 3 (moderate): Secondary | ICD-10-CM | POA: Diagnosis not present

## 2019-06-14 DIAGNOSIS — E1122 Type 2 diabetes mellitus with diabetic chronic kidney disease: Secondary | ICD-10-CM | POA: Diagnosis not present

## 2019-06-14 DIAGNOSIS — I2510 Atherosclerotic heart disease of native coronary artery without angina pectoris: Secondary | ICD-10-CM | POA: Diagnosis not present

## 2019-06-14 DIAGNOSIS — C3401 Malignant neoplasm of right main bronchus: Secondary | ICD-10-CM | POA: Diagnosis not present

## 2019-06-14 DIAGNOSIS — C7951 Secondary malignant neoplasm of bone: Secondary | ICD-10-CM | POA: Diagnosis not present

## 2019-06-14 DIAGNOSIS — Z5112 Encounter for antineoplastic immunotherapy: Secondary | ICD-10-CM | POA: Diagnosis not present

## 2019-06-14 DIAGNOSIS — C787 Secondary malignant neoplasm of liver and intrahepatic bile duct: Secondary | ICD-10-CM | POA: Diagnosis not present

## 2019-06-14 DIAGNOSIS — I5030 Unspecified diastolic (congestive) heart failure: Secondary | ICD-10-CM | POA: Diagnosis not present

## 2019-06-14 DIAGNOSIS — R5081 Fever presenting with conditions classified elsewhere: Secondary | ICD-10-CM | POA: Diagnosis not present

## 2019-06-14 MED ORDER — DEXAMETHASONE SODIUM PHOSPHATE 10 MG/ML IJ SOLN
10.0000 mg | Freq: Once | INTRAMUSCULAR | Status: AC
  Administered 2019-06-14: 10 mg via INTRAVENOUS

## 2019-06-14 MED ORDER — DEXAMETHASONE SODIUM PHOSPHATE 10 MG/ML IJ SOLN
INTRAMUSCULAR | Status: AC
  Filled 2019-06-14: qty 1

## 2019-06-14 MED ORDER — SODIUM CHLORIDE 0.9% FLUSH
10.0000 mL | INTRAVENOUS | Status: DC | PRN
  Administered 2019-06-14: 10 mL
  Filled 2019-06-14: qty 10

## 2019-06-14 MED ORDER — HEPARIN SOD (PORK) LOCK FLUSH 100 UNIT/ML IV SOLN
500.0000 [IU] | Freq: Once | INTRAVENOUS | Status: AC | PRN
  Administered 2019-06-14: 500 [IU]
  Filled 2019-06-14: qty 5

## 2019-06-14 MED ORDER — SODIUM CHLORIDE 0.9 % IV SOLN
Freq: Once | INTRAVENOUS | Status: AC
  Administered 2019-06-14: 08:00:00 via INTRAVENOUS
  Filled 2019-06-14: qty 250

## 2019-06-14 MED ORDER — SODIUM CHLORIDE 0.9 % IV SOLN
80.0000 mg/m2 | Freq: Once | INTRAVENOUS | Status: AC
  Administered 2019-06-14: 150 mg via INTRAVENOUS
  Filled 2019-06-14: qty 7.5

## 2019-06-14 NOTE — Patient Instructions (Signed)
[facility] Discharge Instructions for Patients Receiving Chemotherapy  Today you received the following chemotherapy agents: Etoposide.   To help prevent nausea and vomiting after your treatment, we encourage you to take your nausea medication as directed.   If you develop nausea and vomiting that is not controlled by your nausea medication, call the clinic.   BELOW ARE SYMPTOMS THAT SHOULD BE REPORTED IMMEDIATELY:  *FEVER GREATER THAN 100.5 F  *CHILLS WITH OR WITHOUT FEVER  NAUSEA AND VOMITING THAT IS NOT CONTROLLED WITH YOUR NAUSEA MEDICATION  *UNUSUAL SHORTNESS OF BREATH  *UNUSUAL BRUISING OR BLEEDING  TENDERNESS IN MOUTH AND THROAT WITH OR WITHOUT PRESENCE OF ULCERS  *URINARY PROBLEMS  *BOWEL PROBLEMS  UNUSUAL RASH Items with * indicate a potential emergency and should be followed up as soon as possible.  Feel free to call the clinic should you have any questions or concerns. The clinic phone number is [phone].  Please show the [facility] at check-in to the Emergency Department and triage nurse.

## 2019-06-15 ENCOUNTER — Other Ambulatory Visit: Payer: Medicare Other | Admitting: Adult Health Nurse Practitioner

## 2019-06-15 ENCOUNTER — Ambulatory Visit: Payer: Medicare Other

## 2019-06-15 ENCOUNTER — Telehealth: Payer: Self-pay

## 2019-06-15 ENCOUNTER — Inpatient Hospital Stay: Payer: Medicare Other

## 2019-06-15 DIAGNOSIS — Z515 Encounter for palliative care: Secondary | ICD-10-CM | POA: Diagnosis not present

## 2019-06-15 NOTE — Telephone Encounter (Signed)
At the direction of Amy, Palliative NP, message sent to Dr. Julien Nordmann to update on recent visit with Palliative care and request of patient for different pain medication and patient reports of depression

## 2019-06-15 NOTE — Progress Notes (Signed)
Therapist, nutritional Palliative Care Consult Note Telephone: [phone]  Fax: [phone]  PATIENT NAME: Andrew West DOB: Apr 23, 1966 MRN: [id number]  PRIMARY CARE PROVIDER:   [name], MD  REFERRING PROVIDER: Dr. Si Gaul  RESPONSIBLE PARTY:   Self [phone]  Due to the COVID-19 crisis, this visit was done via telemedicine and it was initiated and consent by this patient and or family. Video-audio (telehealth) contact was unable to be done due to technical barriers from the patient's side.  Was difficult to hear patient over phone; have in person visit next week.    RECOMMENDATIONS and PLAN:  1.  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  Patient diagnosed in October 2019.  Is currently on systemic treatment with chemo and radiation.  Has problems with pancytopenia due to treatment with frequent hospitalizations. Being followed by Dr. Si Gaul.  Patient has uncontrolled symptoms related to his disease addressed below  2.  Pain. Patient is currently on Fentanyl 25 mcg/hr 2 patches every three days.  Also has percocet 5/325 mg 1 tab every 8 hours.  Patient states that he is in pain all the time which effects his sleep and regular daily activities.  This morning states that his pain is a 9/10 after putting on his fentanyl patches and taking a percocet.  Does state that sometimes his pain will go down to 7/10. States that he would like to try something other than percocet.  Had an unclear connection so was unable to hear everything patient was saying.  Not sure what all has been tried for his pain.  Will reach out to Dr. [name] to see if he would like to make any changes to his pain regimen.  Could increase fentanyl to 75 mcg/ hr patch every 3 days, increase his percocet dosage and/or frequency, or change to a different opioid such as morphine or hydrocodone.  3.  Nausea.  Patient has dysphagia and odynophagia and gets all nutrition through PEG  tube.  States that whenever he puts anything on his stomach that he gets nauseous.  Does state that it is not as bad when he separates it into 4 feedings instead of 3 larger ones. States that he has phenergan and zofran and takes something almost every 2 hours.  When he takes the nausea meds he gets one and a half to two hours hours of relief.  Have suggested separating his feedings into more frequent feedings to see if this helps.  He continues to lose weight and is seeing a nutritionist to help with getting enough calories.    4.  Depression.  Patient states that he has been having "crazy thoughts."  When asked he states those are thoughts of harming himself.  States that when having those thoughts he prays and also gets support from his wife.  Does state that he needs to talk with someone again.  Will reach out to referring provider and PCP.  Could use SSRI management and/or counseling.  Did leave him my contact info to call if needed someone to talk to as well.  5.  Goals of care.  Patient really wants better relief of symptoms and have reached out to providers.  Have appointment next week in person to reevaluate these symptoms again and start addressing ACP.  I spent 30 minutes providing this consultation,  from 9:00 to 9:30. More than 50% of the time in this consultation was spent coordinating communication.   HISTORY OF PRESENT ILLNESS:  Andrew West is a 53 y.o. year old male with multiple medical problems including small cell lung cancer of RUL, CAD, DMT2, HTN, OSA, COPD. Palliative Care was asked to help address goals of care.   CODE STATUS: Full Code  PPS: 60% HOSPICE ELIGIBILITY/DIAGNOSIS: TBD  PAST MEDICAL HISTORY:  Past Medical History:  Diagnosis Date  . Anxiety   . CAD (coronary artery disease)    a. s/p multiple PCIs with last cath 11/2016 with severe multivessel CAD, s/p PCTA to LCx but unable to pass stent  . Cancer (HCC)   . Chronic leg pain    bilateral  . Chronic  lower back pain   . COPD (chronic obstructive pulmonary disease) (HCC)   . Depression   . GERD (gastroesophageal reflux disease)    Takes Dexilant  . HLD (hyperlipidemia)   . Hypertension   . Rhabdomyolysis    h/o, r/t statins  . Sleep apnea    "can't tolerate mask" (12/16/2016)  . Type II diabetes mellitus (HCC)     SOCIAL HX:  Social History   Tobacco Use  . Smoking status: Former Smoker    Packs/day: 0.25    Years: 25.00    Pack years: 6.25    Types: Cigarettes    Quit date: 05/24/2015    Years since quitting: 4.0  . Smokeless tobacco: Never Used  Substance Use Topics  . Alcohol use: No    Alcohol/week: 0.0 standard drinks    ALLERGIES:  Allergies  Allergen Reactions  . Iohexol Anaphylaxis    PT. TO BE PREMEDICATED PRIOR TO IV CONTRAST PER DR Eppie Gibson /MMS//12/15/15Desc: PT BECAME SOB AND CHEST TIGHTNESS AFTER CONTRAST INJECTION.  Ardis Hughs., Onset Date: [id number]      PERTINENT MEDICATIONS:  Outpatient Encounter Medications as of 06/15/2019  Medication Sig  . Accu-Chek FastClix Lancets MISC Use as directed to test blood sugar once daily. DX E11.9  . acetaminophen (TYLENOL) 500 MG tablet Take 1,000 mg by mouth every 6 (six) hours as needed for moderate pain.  . Blood Glucose Monitoring Suppl (ACCU-CHEK GUIDE) w/Device KIT 1 each by Does not apply route daily. Use as directed to test blood sugar once daily. DX E11.9  . fentaNYL (DURAGESIC) 25 MCG/HR Place 1 patch onto the skin every 3 (three) days.  Marland Kitchen glucose blood (ACCU-CHEK GUIDE) test strip Use as directed to test blood sugar once daily. DX E11.9  . lidocaine-prilocaine (EMLA) cream Apply 1 application topically as needed.  Marland Kitchen LORazepam (ATIVAN) 0.5 MG tablet 1 tab po q 4-6 hours prn or 1 tab po 30 minutes prior to radiation (Patient taking differently: Take 0.5 mg by mouth every 6 (six) hours as needed for anxiety or sedation. 1 tab po q 4-6 hours prn or 1 tab po 30 minutes prior to radiation)  . mirtazapine  (REMERON) 30 MG tablet Take 1 tablet (30 mg total) by mouth at bedtime.  . nebivolol (BYSTOLIC) 5 MG tablet Take 1 tablet (5 mg total) by mouth daily.  . nitroGLYCERIN (NITROSTAT) 0.4 MG SL tablet Place 1 tablet (0.4 mg total) under the tongue every 5 (five) minutes as needed for chest pain. (Patient not taking: Reported on 06/13/2019)  . oxyCODONE-acetaminophen (PERCOCET/ROXICET) 5-325 MG tablet Take 1 tablet by mouth every 8 (eight) hours as needed for severe pain.  . pantoprazole (PROTONIX) 40 MG tablet Take 1 tablet (40 mg total) by mouth daily.  . prochlorperazine (COMPAZINE) 10 MG tablet Take 1 tablet (10 mg total) by mouth every 6 (  six) hours as needed for nausea or vomiting. (Patient not taking: Reported on 06/13/2019)  . ranolazine (RANEXA) 1000 MG SR tablet Take 1 tablet (1,000 mg total) by mouth 2 (two) times daily.  . rosuvastatin (CRESTOR) 20 MG tablet Take 1 tablet (20 mg total) by mouth daily.  . sucralfate (CARAFATE) 1 g tablet Take 1 tablet by mouth 4 (four) times daily -  before meals and at bedtime.  Marland Kitchen tiotropium (SPIRIVA HANDIHALER) 18 MCG inhalation capsule Place 1 capsule (18 mcg total) into inhaler and inhale daily.  . vitamin B-12 (CYANOCOBALAMIN) 500 MCG tablet Take 1 tablet (500 mcg total) by mouth daily.  Marland Kitchen zolpidem (AMBIEN) 5 MG tablet Take 1 tablet (5 mg total) by mouth at bedtime.   No facility-administered encounter medications on file as of 06/15/2019.       Jayel Inks Marlena Clipper, NP

## 2019-06-16 ENCOUNTER — Ambulatory Visit: Payer: Medicare Other

## 2019-06-18 ENCOUNTER — Telehealth: Payer: Self-pay | Admitting: Physician Assistant

## 2019-06-18 ENCOUNTER — Ambulatory Visit: Payer: Medicare Other

## 2019-06-18 ENCOUNTER — Inpatient Hospital Stay: Payer: Medicare Other

## 2019-06-18 NOTE — Telephone Encounter (Signed)
Called the patient about his missed injection appointment today. I discussed the importance of getting his injection. I have sent a scheduling message to reschedule his injection for 06/19/2019. He expressed understanding and knows the plan is to return tomorrow for this.

## 2019-06-19 ENCOUNTER — Other Ambulatory Visit: Payer: Self-pay

## 2019-06-19 ENCOUNTER — Inpatient Hospital Stay: Payer: Medicare Other

## 2019-06-19 ENCOUNTER — Ambulatory Visit
Admission: RE | Admit: 2019-06-19 | Discharge: 2019-06-19 | Disposition: A | Discharge status: Home / Self Care | Payer: Medicare Other | Source: Ambulatory Visit | Attending: Radiation Oncology | Admitting: Radiation Oncology

## 2019-06-19 ENCOUNTER — Other Ambulatory Visit: Payer: Self-pay | Admitting: Medical Oncology

## 2019-06-19 ENCOUNTER — Telehealth: Payer: Self-pay | Admitting: Medical Oncology

## 2019-06-19 ENCOUNTER — Other Ambulatory Visit: Payer: Medicare Other | Admitting: Adult Health Nurse Practitioner

## 2019-06-19 ENCOUNTER — Other Ambulatory Visit: Payer: Self-pay | Admitting: Internal Medicine

## 2019-06-19 ENCOUNTER — Ambulatory Visit: Payer: Medicare Other

## 2019-06-19 ENCOUNTER — Telehealth: Payer: Self-pay

## 2019-06-19 DIAGNOSIS — D6481 Anemia due to antineoplastic chemotherapy: Secondary | ICD-10-CM | POA: Diagnosis not present

## 2019-06-19 DIAGNOSIS — Z5112 Encounter for antineoplastic immunotherapy: Secondary | ICD-10-CM | POA: Diagnosis not present

## 2019-06-19 DIAGNOSIS — C3411 Malignant neoplasm of upper lobe, right bronchus or lung: Secondary | ICD-10-CM

## 2019-06-19 DIAGNOSIS — C3401 Malignant neoplasm of right main bronchus: Secondary | ICD-10-CM | POA: Diagnosis not present

## 2019-06-19 DIAGNOSIS — Z515 Encounter for palliative care: Secondary | ICD-10-CM

## 2019-06-19 DIAGNOSIS — Z431 Encounter for attention to gastrostomy: Secondary | ICD-10-CM | POA: Diagnosis not present

## 2019-06-19 DIAGNOSIS — Z5111 Encounter for antineoplastic chemotherapy: Secondary | ICD-10-CM | POA: Diagnosis not present

## 2019-06-19 DIAGNOSIS — Z95828 Presence of other vascular implants and grafts: Secondary | ICD-10-CM

## 2019-06-19 DIAGNOSIS — C787 Secondary malignant neoplasm of liver and intrahepatic bile duct: Secondary | ICD-10-CM | POA: Diagnosis not present

## 2019-06-19 DIAGNOSIS — D709 Neutropenia, unspecified: Secondary | ICD-10-CM | POA: Diagnosis not present

## 2019-06-19 DIAGNOSIS — R5081 Fever presenting with conditions classified elsewhere: Secondary | ICD-10-CM | POA: Diagnosis not present

## 2019-06-19 DIAGNOSIS — A419 Sepsis, unspecified organism: Secondary | ICD-10-CM | POA: Diagnosis not present

## 2019-06-19 DIAGNOSIS — Z510 Encounter for antineoplastic radiation therapy: Secondary | ICD-10-CM | POA: Diagnosis not present

## 2019-06-19 DIAGNOSIS — C7951 Secondary malignant neoplasm of bone: Secondary | ICD-10-CM | POA: Diagnosis not present

## 2019-06-19 LAB — CBC WITH DIFFERENTIAL (CANCER CENTER ONLY)
Abs Immature Granulocytes: 0.08 10*3/uL — ABNORMAL HIGH (ref 0.00–0.07)
Basophils Absolute: 0 10*3/uL (ref 0.0–0.1)
Basophils Relative: 1 %
Eosinophils Absolute: 0.1 10*3/uL (ref 0.0–0.5)
Eosinophils Relative: 1 %
HCT: 24.5 % — ABNORMAL LOW (ref 39.0–52.0)
Hemoglobin: 8 g/dL — ABNORMAL LOW (ref 13.0–17.0)
Immature Granulocytes: 2 %
Lymphocytes Relative: 4 %
Lymphs Abs: 0.2 10*3/uL — ABNORMAL LOW (ref 0.7–4.0)
MCH: 28.9 pg (ref 26.0–34.0)
MCHC: 32.7 g/dL (ref 30.0–36.0)
MCV: 88.4 fL (ref 80.0–100.0)
Monocytes Absolute: 0.1 10*3/uL (ref 0.1–1.0)
Monocytes Relative: 2 %
Neutro Abs: 3.9 10*3/uL (ref 1.7–7.7)
Neutrophils Relative %: 90 %
Platelet Count: 116 10*3/uL — ABNORMAL LOW (ref 150–400)
RBC: 2.77 MIL/uL — ABNORMAL LOW (ref 4.22–5.81)
RDW: 14.3 % (ref 11.5–15.5)
WBC Count: 4.3 10*3/uL (ref 4.0–10.5)
nRBC: 0 % (ref 0.0–0.2)

## 2019-06-19 LAB — CMP (CANCER CENTER ONLY)
ALT: 16 U/L (ref 0–44)
AST: 18 U/L (ref 15–41)
Albumin: 3.2 g/dL — ABNORMAL LOW (ref 3.5–5.0)
Alkaline Phosphatase: 64 U/L (ref 38–126)
Anion gap: 7 (ref 5–15)
BUN: 27 mg/dL — ABNORMAL HIGH (ref 6–20)
CO2: 28 mmol/L (ref 22–32)
Calcium: 9 mg/dL (ref 8.9–10.3)
Chloride: 106 mmol/L (ref 98–111)
Creatinine: 1.18 mg/dL (ref 0.61–1.24)
GFR, Est AFR Am: >60 mL/min (ref >60)
GFR, Estimated: >60 mL/min (ref >60)
Glucose, Bld: 62 mg/dL — ABNORMAL LOW (ref 70–99)
Potassium: 3.9 mmol/L (ref 3.5–5.1)
Sodium: 141 mmol/L (ref 135–145)
Total Bilirubin: 0.2 mg/dL — ABNORMAL LOW (ref 0.3–1.2)
Total Protein: 6.5 g/dL (ref 6.5–8.1)

## 2019-06-19 LAB — MAGNESIUM: Magnesium: 1.7 mg/dL (ref 1.7–2.4)

## 2019-06-19 MED ORDER — HEPARIN SOD (PORK) LOCK FLUSH 100 UNIT/ML IV SOLN
500.0000 [IU] | Freq: Once | INTRAVENOUS | Status: AC | PRN
  Administered 2019-06-19: 500 [IU]
  Filled 2019-06-19: qty 5

## 2019-06-19 MED ORDER — PEGFILGRASTIM-CBQV 6 MG/0.6ML ~~LOC~~ SOSY
6.0000 mg | PREFILLED_SYRINGE | Freq: Once | SUBCUTANEOUS | Status: AC
  Administered 2019-06-19: 6 mg via SUBCUTANEOUS

## 2019-06-19 MED ORDER — SODIUM CHLORIDE 0.9% FLUSH
10.0000 mL | INTRAVENOUS | Status: DC | PRN
  Administered 2019-06-19: 10 mL
  Filled 2019-06-19: qty 10

## 2019-06-19 MED ORDER — OXYCODONE-ACETAMINOPHEN 5-325 MG PO TABS: 1.0000 | ORAL_TABLET | Freq: Three times a day (TID) | ORAL | 0 refills | Status: DC | PRN

## 2019-06-19 MED ORDER — PEGFILGRASTIM-CBQV 6 MG/0.6ML ~~LOC~~ SOSY
PREFILLED_SYRINGE | SUBCUTANEOUS | Status: AC
  Filled 2019-06-19: qty 0.6

## 2019-06-19 MED FILL — OXYCODONE-ACETAMINOPHEN 5-3: 5-325 | 15 days supply | Qty: 45 | Fill #0

## 2019-06-19 NOTE — Telephone Encounter (Addendum)
F/U on Home health RN concerns  Met Cotton in the lobby -he was playing a game on his phone while I was talking to him. He was in NAD .  PEG tube/nutrition-  He lifted up his G tube to show me -no wincing or pain when he did that. I do not see any drainage or redness around the tube. Tube is intact with dry dressing in place. The dietician left him samples of a different formula to use to try to decrease diarrhea.  Pain- He requested refill for oxycodone. He is due . He is not wearing his fentanyl patch . He initially said "they told me to take it off in xrt."then he said no they didn't - I just took it off". I told him to leave the patch on so he can get consistent pain control and this may be a reason he is in more pain, I told him to only take the patch off every 72 hours when it is time to change it. Per Cassie his fentanyl dose is 50 mcg and I told pt  he should be wearing two 25 mcg  patches. Second opinion-- he feels like not much is being done for him here. I told him he has missed his several  treatment appts and missing his tx may have an impact on his prognosis. Referral made to [facility]. Suicidal - He did not express any suicide ideation. Labs -reviewed with pt .

## 2019-06-19 NOTE — Telephone Encounter (Signed)
Epic message sent to Dr. Julien Nordmann to provide update on Palliative care visit.

## 2019-06-19 NOTE — Progress Notes (Signed)
Therapist, nutritional Palliative Care Consult Note Telephone: [phone]  Fax: [phone]  PATIENT NAME: Andrew West DOB: [birth year] MRN: [id number]  PRIMARY CARE PROVIDER:   [name], MD  REFERRING PROVIDER: Dr. Si Gaul  RESPONSIBLE PARTY:    Self [phone]     RECOMMENDATIONS and PLAN:  1.  Pain.  Patient still expresses uncontrolled pain in his lower back and legs.  Several things have been tried which makes this case difficult.  Currently on Fentanyl 50 mcg/hr Q3days and percocet  5/325 mg Q8hrs.  He is also undergoing radiation therapy. States that he has tried lyrica and gabapentin with no relief.  States that hydrocodone didn't help and that morphine makes him feel bad.  Has tried cymbalta without relief and when suggested nortriptyline he did not want to try it due to its side effect profile. States that he has tried ibuprofen and Aleve without any additional relief.  Only other options are to increase dose and/or frequency of the percocet or to increase the fentanyl.  Have reached out to RN clinical navigator who states that there is a pain clinic in [place] that may be able to help if patient is able to go that far.  They may be able to offer other treatment options.    2.  Nausea. Still experiencing nausea.  Will have vomiting if puts too much on his stomach at once.  Have encouraged to spread out his tube feedings to help prevent the nausea.  Is on compazine for the nausea which provides some relief.  Has also tried phenergan and zofran with short term relief of the nausea.  Has been experiencing diarrhea with his current formula and HH RN has reached out to Dr. Asa Lente office to see what options the dietician has to help with this. Is having tenderness at PEG site. Wife does state that there is a thick yellowish drainage coming from the site. There is tenderness to touch. No redness, warmth to touch, induration or drainage noted.   HH RN reached out office and patient was recommended to go to ER for further evaluation.  3.  Depression.  Patient experiencing depression related to medical diagnosis and unrelieved pain.  Has had SI which he gets support from his wife and with prayer.  He is on Remeron 30 mg for depression.  He is depressed today and expresses hopelessness.   States he does not feel like he is getting any better.  Did go over options of seeking another opinion about his cancer treatment and HH RN has asked office for referral to [facility]. Also discussed hospice services if he chooses not to pursue any further treatment.  He was in agreement with the referral to [facility] as he is not ready to stop treatment at this time.  Have reached out to our palliative SW to provide psychosocial support.  Recommend referral to psychiatrist for any further medicine management of his depression.  4.  Goals of care.  Patient wanting second opinion of his cancer treatment.  Does not want hospice at this time.  Patient had to leave for an appointment and set up appointment next week to further go over ACP.  I spent 60 minutes providing this consultation,  from 10:00 to 11:00. More than 50% of the time in this consultation was spent coordinating communication.   HISTORY OF PRESENT ILLNESS:  Andrew West is a 53 y.o. year old male with multiple medical problems including  small cell lung cancer of RUL, CAD, DMT2, HTN, OSA, COPD. Palliative Care was asked to help address goals of care.   CODE STATUS: Full Code  PPS: 60% HOSPICE ELIGIBILITY/DIAGNOSIS: TBD  PHYSICAL EXAM:  BP 116/78  HR 104  O2 96%  Temp 98.0  Resp 20 General: NAD, frail appearing, thin Abdomen: PEG tube in place with clean dressing with tenderness to touch; no induration, warmth to touch or redness noted Extremities: no edema, no joint deformities Skin: no rashes Neurological: Weakness but otherwise nonfocal   PAST MEDICAL HISTORY:  Past  Medical History:  Diagnosis Date   Anxiety    CAD (coronary artery disease)    a. s/p multiple PCIs with last cath 11/2016 with severe multivessel CAD, s/p PCTA to LCx but unable to pass stent   Cancer (HCC)    Chronic leg pain    bilateral   Chronic lower back pain    COPD (chronic obstructive pulmonary disease) (HCC)    Depression    GERD (gastroesophageal reflux disease)    Takes Dexilant   HLD (hyperlipidemia)    Hypertension    Rhabdomyolysis    h/o, r/t statins   Sleep apnea    "can't tolerate mask" (12/16/2016)   Type II diabetes mellitus (HCC)     SOCIAL HX:  Social History   Tobacco Use   Smoking status: Former Smoker    Packs/day: 0.25    Years: 25.00    Pack years: 6.25    Types: Cigarettes    Quit date: 05/24/2015    Years since quitting: 4.0   Smokeless tobacco: Never Used  Substance Use Topics   Alcohol use: No    Alcohol/week: 0.0 standard drinks    ALLERGIES:  Allergies  Allergen Reactions   Iohexol Anaphylaxis    PT. TO BE PREMEDICATED PRIOR TO IV CONTRAST PER DR STAHL /MMS//12/15/15Desc: PT BECAME SOB AND CHEST TIGHTNESS AFTER CONTRAST INJECTION.  Ardis Hughs., Onset Date: [id number]      PERTINENT MEDICATIONS:  Outpatient Encounter Medications as of 06/19/2019  Medication Sig   Accu-Chek FastClix Lancets MISC Use as directed to test blood sugar once daily. DX E11.9   acetaminophen (TYLENOL) 500 MG tablet Take 1,000 mg by mouth every 6 (six) hours as needed for moderate pain.   Blood Glucose Monitoring Suppl (ACCU-CHEK GUIDE) w/Device KIT 1 each by Does not apply route daily. Use as directed to test blood sugar once daily. DX E11.9   fentaNYL (DURAGESIC) 25 MCG/HR Place 1 patch onto the skin every 3 (three) days.   glucose blood (ACCU-CHEK GUIDE) test strip Use as directed to test blood sugar once daily. DX E11.9   lidocaine-prilocaine (EMLA) cream Apply 1 application topically as needed.   LORazepam (ATIVAN) 0.5 MG  tablet 1 tab po q 4-6 hours prn or 1 tab po 30 minutes prior to radiation (Patient taking differently: Take 0.5 mg by mouth every 6 (six) hours as needed for anxiety or sedation. 1 tab po q 4-6 hours prn or 1 tab po 30 minutes prior to radiation)   mirtazapine (REMERON) 30 MG tablet Take 1 tablet (30 mg total) by mouth at bedtime.   nebivolol (BYSTOLIC) 5 MG tablet Take 1 tablet (5 mg total) by mouth daily.   nitroGLYCERIN (NITROSTAT) 0.4 MG SL tablet Place 1 tablet (0.4 mg total) under the tongue every 5 (five) minutes as needed for chest pain. (Patient not taking: Reported on 06/13/2019)   pantoprazole (PROTONIX) 40 MG tablet Take 1 tablet (40  mg total) by mouth daily.   prochlorperazine (COMPAZINE) 10 MG tablet Take 1 tablet (10 mg total) by mouth every 6 (six) hours as needed for nausea or vomiting. (Patient not taking: Reported on 06/13/2019)   ranolazine (RANEXA) 1000 MG SR tablet Take 1 tablet (1,000 mg total) by mouth 2 (two) times daily.   rosuvastatin (CRESTOR) 20 MG tablet Take 1 tablet (20 mg total) by mouth daily.   sucralfate (CARAFATE) 1 g tablet Take 1 tablet by mouth 4 (four) times daily -  before meals and at bedtime.   tiotropium (SPIRIVA HANDIHALER) 18 MCG inhalation capsule Place 1 capsule (18 mcg total) into inhaler and inhale daily.   vitamin B-12 (CYANOCOBALAMIN) 500 MCG tablet Take 1 tablet (500 mcg total) by mouth daily.   zolpidem (AMBIEN) 5 MG tablet Take 1 tablet (5 mg total) by mouth at bedtime.   [DISCONTINUED] sodium chloride flush (NS) 0.9 % injection 10 mL    No facility-administered encounter medications on file as of 06/19/2019.      Allie Gerhold Marlena Clipper, NP

## 2019-06-19 NOTE — Telephone Encounter (Signed)
Refill oxycodone

## 2019-06-19 NOTE — Telephone Encounter (Addendum)
Multiple issues per Home Health nurse Amy: PEG tube drainage -tender to touch -request evaluation Diarrhea- request to change tube feeding. Uncontrolled pain- takes sleeping pills during day so he can sleep and not feel pain. Has 7 pain pills left-he is taking them as prescribed. Suicidal- mentioned suicide to [facility] RN because he cannot take the pain. Second opinion requested to [facility].  Per Dr Julien Nordmann pt needs to be evaluated in the ED. He missed a several days of chemo and injection . Called pt and he said if he doesn't feel any better he will go to ED. He said he was coming in for injection today . I told him he missed that  appt  this am .He said he just talked to someone about the appts and was told to come in this afternoon for injection ( also getting radiation) . Called to Amy at home health. Schedule  Message sent.

## 2019-06-19 NOTE — Telephone Encounter (Signed)
Received call from nurse with [facility] who was with patient, wanting to verify time of Palliative Care visit. Visit scheduled with Hanley Ben for today @ 10 am

## 2019-06-19 NOTE — Progress Notes (Signed)
Nutrition Follow-up:  Patient with small cell lung cancer.  PEG reinserted on 6/25.  Patient receiving chemotherapy and palliative radiation to L4 last does 06/15/2019.  Patient followed by home health and Palliative care  Contacted by RN, Diane regarding patient having diarrhea and wanted to change formula.    Spoke with patient via phone for nutrition follow-up. Patient reports that he took 4.5 cartons of Two Cal yesterday but previously has been doing 3 cartons.  Reports that he has been taking 2 cartons at a time.  Reports that he has been using the promod but unable to tell RD how much.  Patient reports diarrhea every other day.  Reports that on days when he has diarrhea that he has about 2 loose stools.  Has not taken any medication to help.  Reports the diarrhea started about 2 days ago. Reports that he did not have diarrhea when he was on this same formula the first time he had the PEG inserted.  Patient requesting to try another formula.    Patient reports that he is taking very little solid foods by mouth.  Reports that he is still drinking fluids.  Reports that he has already drank 3, 16 oz bottles of water today.     Medications: reviewed  Labs: Na 146, BUN 25, creatinine 1.44  Anthropometrics:   Weight is 150 lb 9.6 oz on 7/22 decreased from 155 lb on 7/14   Estimated Energy Needs  Kcals: 2130-2485 calories Protein: 106-124 g Fluid: 2.4 L  NUTRITION DIAGNOSIS: Inadequate oral intake continues providing nutrition via feeding tube   INTERVENTION: Patient requesting to try new formula.  Samples given of osmolite 1.5 to try.  Left for patient in radiation to pick up today at treatment.  Recommend starting osmolite 1.5, 1 carton QID (8am, noon, 4pm, 8pm).  Flush with 42ml of water before and after each feeding.   Recommend patient stop promod at this time.    Discussed with patient that osmolite 1.5 is lower in calories and will likely need 6 or more cartons of tube feeding to  better meet nutritional needs.  Patient verbalized understanding.  If able to tolerate 4 cartons of osmolite 1.5 over the next 2 days recommend adding 1/2 carton of osmolite 1.5 daily until able to take 1 1/2 cartons QID.  Written instructions left for patient to pick up at radiation.  Spoke with RN, Diane and she will follow-up with patient on adding antidiarrheal to regimen as well.       MONITORING, EVALUATION, GOAL: Patient will consume adequate calories via feeding tube and orally to prevent weight loss   NEXT VISIT: Tuesday, August 4th, phone f/u  Andrew West B. Zenia Resides, [place], [place] Registered Dietitian [phone] (pager)

## 2019-06-19 NOTE — Telephone Encounter (Signed)
Epic message sent to Dr. Margarita Rana to provide update on Palliative Care visit.

## 2019-06-20 ENCOUNTER — Other Ambulatory Visit: Payer: Self-pay | Admitting: Radiation Oncology

## 2019-06-20 ENCOUNTER — Telehealth: Payer: Self-pay | Admitting: Medical Oncology

## 2019-06-20 ENCOUNTER — Other Ambulatory Visit: Payer: Self-pay | Admitting: Internal Medicine

## 2019-06-20 ENCOUNTER — Other Ambulatory Visit: Payer: Self-pay

## 2019-06-20 ENCOUNTER — Ambulatory Visit: Payer: Medicare Other | Attending: Family Medicine | Admitting: Family Medicine

## 2019-06-20 ENCOUNTER — Encounter: Payer: Self-pay | Admitting: Radiation Oncology

## 2019-06-20 ENCOUNTER — Ambulatory Visit
Admission: RE | Admit: 2019-06-20 | Discharge: 2019-06-20 | Disposition: A | Discharge status: Home / Self Care | Payer: Medicare Other | Source: Ambulatory Visit | Attending: Radiation Oncology | Admitting: Radiation Oncology

## 2019-06-20 ENCOUNTER — Other Ambulatory Visit: Payer: Medicare Other

## 2019-06-20 DIAGNOSIS — Z510 Encounter for antineoplastic radiation therapy: Secondary | ICD-10-CM | POA: Diagnosis not present

## 2019-06-20 DIAGNOSIS — C7951 Secondary malignant neoplasm of bone: Secondary | ICD-10-CM

## 2019-06-20 DIAGNOSIS — R630 Anorexia: Secondary | ICD-10-CM

## 2019-06-20 DIAGNOSIS — C3411 Malignant neoplasm of upper lobe, right bronchus or lung: Secondary | ICD-10-CM

## 2019-06-20 MED ORDER — MEGESTROL ACETATE 625 MG/5ML PO SUSP: 625.0000 mg | Freq: Every day | ORAL | 1 refills | Status: AC

## 2019-06-20 MED ORDER — MEGESTROL ACETATE 20 MG PO TABS: 20.0000 mg | ORAL_TABLET | Freq: Every day | ORAL | 1 refills | Status: DC

## 2019-06-20 MED FILL — MEGESTROL 20 MG TABLET: 20 | 30 days supply | Qty: 30 | Fill #0

## 2019-06-20 NOTE — Progress Notes (Signed)
Virtual Visit via Telephone Note  I connected with Andrew West, on 06/20/2019 at 8:38 AM by telephone due to the COVID-19 pandemic and verified that I am speaking with the correct person using two identifiers.   Consent: I discussed the limitations, risks, security and privacy concerns of performing an evaluation and management service by telephone and the availability of in person appointments. I also discussed with the patient that there may be a patient responsible charge related to this service. The patient expressed understanding and agreed to proceed.   Location of Patient: Patient's car  Location of Provider: Clinic   Persons participating in Telemedicine visit: Andrew West-CMA Dr. Felecia Shelling     History of Present Illness: Andrew West  is a 53 y.o. 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  He also has a PEG tube for enteral feeds. He continues to have chronic low back pain and is on Percocet at this time which he received from His Oncologist. At the moment he is driving on his way to his radiation treatment.    Past Medical History:  Diagnosis Date  . Anxiety   . CAD (coronary artery disease)    a. s/p multiple PCIs with last cath 11/2016 with severe multivessel CAD, s/p PCTA to LCx but unable to pass stent  . Cancer ([place])   . Chronic leg pain    bilateral  . Chronic lower back pain   .  COPD (chronic obstructive pulmonary disease) ([place])   . Depression   . GERD (gastroesophageal reflux disease)    Takes Dexilant  . HLD (hyperlipidemia)   . Hypertension   . Rhabdomyolysis    h/o, r/t statins  . Sleep apnea    "can't tolerate mask" (12/16/2016)  . Type II diabetes mellitus (HCC)    Allergies  Allergen Reactions  . Iohexol Anaphylaxis    PT. TO BE PREMEDICATED PRIOR TO IV CONTRAST PER DR Kris Hartmann /MMS//12/15/15Desc: PT BECAME SOB AND CHEST TIGHTNESS AFTER CONTRAST INJECTION.  [name],RT-RCT., Onset Date: [id number]     Current Outpatient Medications on File Prior to Visit  Medication Sig Dispense Refill  . Accu-Chek FastClix Lancets MISC Use as directed to test blood sugar once daily. DX E11.9 102 each 12  . acetaminophen (TYLENOL) 500 MG tablet Take 1,000 mg by mouth every 6 (six) hours as needed for moderate pain.    . Blood Glucose Monitoring Suppl (ACCU-CHEK GUIDE) w/Device KIT 1 each by Does not apply route daily. Use as directed to test blood sugar once daily. DX E11.9 1 kit 0  . fentaNYL (DURAGESIC) 25 MCG/HR Place 1 patch onto the skin every 3 (three) days. 5 patch 0  . glucose blood (ACCU-CHEK GUIDE) test strip Use as directed to test blood sugar once daily. DX E11.9 100 each 12  . lidocaine-prilocaine (EMLA) cream Apply 1 application topically as needed. 30 g 0  . LORazepam (ATIVAN) 0.5 MG tablet 1 tab po q 4-6 hours prn  or 1 tab po 30 minutes prior to radiation (Patient taking differently: Take 0.5 mg by mouth every 6 (six) hours as needed for anxiety or sedation. 1 tab po q 4-6 hours prn or 1 tab po 30 minutes prior to radiation) 30 tablet 0  . mirtazapine (REMERON) 30 MG tablet Take 1 tablet (30 mg total) by mouth at bedtime. 30 tablet 3  . nebivolol (BYSTOLIC) 5 MG tablet Take 1 tablet (5 mg total) by mouth daily. 90 tablet 3  . oxyCODONE-acetaminophen (PERCOCET/ROXICET) 5-325 MG tablet Take 1 tablet by mouth every 8 (eight) hours as needed for severe pain.  45 tablet 0  . pantoprazole (PROTONIX) 40 MG tablet Take 1 tablet (40 mg total) by mouth daily. 90 tablet 1  . ranolazine (RANEXA) 1000 MG SR tablet Take 1 tablet (1,000 mg total) by mouth 2 (two) times daily. 180 tablet 3  . rosuvastatin (CRESTOR) 20 MG tablet Take 1 tablet (20 mg total) by mouth daily. 90 tablet 1  . sucralfate (CARAFATE) 1 g tablet Take 1 tablet by mouth 4 (four) times daily -  before meals and at bedtime.  0  . tiotropium (SPIRIVA HANDIHALER) 18 MCG inhalation capsule Place 1 capsule (18 mcg total) into inhaler and inhale daily. 90 capsule 1  . vitamin B-12 (CYANOCOBALAMIN) 500 MCG tablet Take 1 tablet (500 mcg total) by mouth daily. 30 tablet 1  . zolpidem (AMBIEN) 5 MG tablet Take 1 tablet (5 mg total) by mouth at bedtime. 30 tablet 3  . nitroGLYCERIN (NITROSTAT) 0.4 MG SL tablet Place 1 tablet (0.4 mg total) under the tongue every 5 (five) minutes as needed for chest pain. (Patient not taking: Reported on 06/13/2019) 25 tablet 2  . prochlorperazine (COMPAZINE) 10 MG tablet Take 1 tablet (10 mg total) by mouth every 6 (six) hours as needed for nausea or vomiting. (Patient not taking: Reported on 06/13/2019) 30 tablet 1   No current facility-administered medications on file prior to visit.     Observations/Objective: AAOx3 Not in acute distress  Assessment and Plan: 1. 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 Earlie Server who cited partial compliance as a major hindrance to his treatment.  Pain and drug-seeking behavior also concerning. He is in the process of being evaluated by [facility] for a second opinion.  2. Loss of appetite Currently on PEG tube feedings No improvement with Remeron and Marinol Placed on Magace after discussion with Oncology - slight risk of  DVT    Follow Up Instructions: Keep previously scheduled appointment.   I discussed the assessment and treatment plan with the patient. The patient was provided an opportunity to ask questions and all were answered. The patient agreed with the plan and demonstrated an understanding of the instructions.   The patient was advised to call back or seek an in-person evaluation if the symptoms worsen or if the condition fails to improve as anticipated.     I provided 11 minutes total of non-face-to-face time during this encounter including median intraservice time, reviewing previous notes, labs, imaging, medications, management and patient verbalized understanding.     [name], MD, FAAFP. [facility] and Elba Livengood, [place]   06/20/2019, 8:38 AM

## 2019-06-20 NOTE — Telephone Encounter (Signed)
XRT will reinforce pt that he can wear his fentanyl patches and it they will not interfere with radiation .

## 2019-06-20 NOTE — Progress Notes (Signed)
Patient has been called and DOB has been verified. Patient has been screened and transferred to PCP to start phone visit.  Patient is having pain in groin area.

## 2019-06-21 ENCOUNTER — Other Ambulatory Visit: Payer: Self-pay | Admitting: Family Medicine

## 2019-06-21 DIAGNOSIS — M5116 Intervertebral disc disorders with radiculopathy, lumbar region: Secondary | ICD-10-CM

## 2019-06-21 NOTE — Progress Notes (Signed)
Message received from palliative care with patient request for referral to specific pain management clinic in [place].

## 2019-06-22 ENCOUNTER — Telehealth: Payer: Self-pay | Admitting: *Deleted

## 2019-06-22 ENCOUNTER — Telehealth: Payer: Self-pay | Admitting: Adult Health Nurse Practitioner

## 2019-06-22 NOTE — Telephone Encounter (Signed)
Patient had left a message.  Returned his call.  Patient picked up but must have had bad service as call was disconnected.  Tried calling back with no answer. Tikesha Mort K. Olena Heckle NP

## 2019-06-22 NOTE — Telephone Encounter (Signed)
Amy for Palliative Care left a message stating Andrew West called her saying he had a bad night with pain.  RN called Andrew West to check on him. Pt states he is having severe pain. Discussed Fentanyl patch and percocet use. Pt states "Dr Julien Nordmann and I are going to have a problem, I've got on 3 of the patches". RN instructed patient to go the the ED to be evaluated as the pain is so severe and he is using more than the prescribed amount of pain medication. Pt frustrated and says he wants something to be done. RN explained need to go to the ED to be evaluated. Pt verbalized understanding.

## 2019-06-25 ENCOUNTER — Telehealth: Payer: Self-pay | Admitting: Adult Health Nurse Practitioner

## 2019-06-25 ENCOUNTER — Telehealth: Payer: Self-pay

## 2019-06-25 NOTE — Telephone Encounter (Signed)
At the direction of Amy NP, phone call placed to [facility] to verify referral has been received. Spoke with Joseph Art, who confirmed that referral had been received and will call patient to schedule visit.

## 2019-06-25 NOTE — Telephone Encounter (Signed)
Had talked with patient and he states his pain is getting worse. Did not want to go to hospital because he does not feel that they address his needs.  Have reached out to our RN clinical navigator to follow up on [facility] and Dr. Mechele Dawley pain management referrals.  Have in person visit with him on 8/6 at 4pm.  Trayce Caravello K. Olena Heckle NP

## 2019-06-25 NOTE — Telephone Encounter (Signed)
Phone call placed to [facility] to check and see if a referral was received.Margarita Grizzle noted that she had not received the referral but was going to check with Dr. Wynetta Emery and her nurse to see if they have received it and return call.

## 2019-06-26 ENCOUNTER — Telehealth: Payer: Self-pay

## 2019-06-26 ENCOUNTER — Other Ambulatory Visit: Payer: Self-pay | Admitting: Internal Medicine

## 2019-06-26 ENCOUNTER — Other Ambulatory Visit: Payer: Self-pay

## 2019-06-26 ENCOUNTER — Other Ambulatory Visit: Payer: Self-pay | Admitting: Medical Oncology

## 2019-06-26 ENCOUNTER — Telehealth: Payer: Self-pay | Admitting: Adult Health Nurse Practitioner

## 2019-06-26 ENCOUNTER — Emergency Department (HOSPITAL_COMMUNITY)
Admission: EM | Admit: 2019-06-26 | Discharge: 2019-06-27 | Disposition: A | Discharge status: Home / Self Care | Payer: Medicare Other | Attending: Emergency Medicine | Admitting: Emergency Medicine

## 2019-06-26 ENCOUNTER — Encounter (HOSPITAL_COMMUNITY): Payer: Self-pay | Admitting: Emergency Medicine

## 2019-06-26 ENCOUNTER — Inpatient Hospital Stay: Payer: Medicare Other

## 2019-06-26 ENCOUNTER — Inpatient Hospital Stay: Payer: Medicare Other | Attending: Internal Medicine

## 2019-06-26 ENCOUNTER — Ambulatory Visit (HOSPITAL_COMMUNITY): Admission: RE | Admit: 2019-06-26 | Payer: Medicare Other | Source: Ambulatory Visit

## 2019-06-26 DIAGNOSIS — M79606 Pain in leg, unspecified: Secondary | ICD-10-CM | POA: Diagnosis present

## 2019-06-26 DIAGNOSIS — N183 Chronic kidney disease, stage 3 (moderate): Secondary | ICD-10-CM | POA: Diagnosis not present

## 2019-06-26 DIAGNOSIS — D696 Thrombocytopenia, unspecified: Secondary | ICD-10-CM | POA: Diagnosis not present

## 2019-06-26 DIAGNOSIS — R110 Nausea: Secondary | ICD-10-CM | POA: Diagnosis not present

## 2019-06-26 DIAGNOSIS — R7989 Other specified abnormal findings of blood chemistry: Secondary | ICD-10-CM

## 2019-06-26 DIAGNOSIS — C7951 Secondary malignant neoplasm of bone: Secondary | ICD-10-CM | POA: Insufficient documentation

## 2019-06-26 DIAGNOSIS — Z79899 Other long term (current) drug therapy: Secondary | ICD-10-CM | POA: Insufficient documentation

## 2019-06-26 DIAGNOSIS — M545 Low back pain, unspecified: Secondary | ICD-10-CM

## 2019-06-26 DIAGNOSIS — Z87891 Personal history of nicotine dependence: Secondary | ICD-10-CM | POA: Diagnosis not present

## 2019-06-26 DIAGNOSIS — E1122 Type 2 diabetes mellitus with diabetic chronic kidney disease: Secondary | ICD-10-CM | POA: Insufficient documentation

## 2019-06-26 DIAGNOSIS — I129 Hypertensive chronic kidney disease with stage 1 through stage 4 chronic kidney disease, or unspecified chronic kidney disease: Secondary | ICD-10-CM | POA: Diagnosis not present

## 2019-06-26 DIAGNOSIS — C3411 Malignant neoplasm of upper lobe, right bronchus or lung: Secondary | ICD-10-CM

## 2019-06-26 DIAGNOSIS — C3490 Malignant neoplasm of unspecified part of unspecified bronchus or lung: Secondary | ICD-10-CM | POA: Diagnosis not present

## 2019-06-26 LAB — CBC WITH DIFFERENTIAL/PLATELET
Abs Immature Granulocytes: 0.29 10*3/uL — ABNORMAL HIGH (ref 0.00–0.07)
Basophils Absolute: 0 10*3/uL (ref 0.0–0.1)
Basophils Relative: 0 %
Eosinophils Absolute: 0 10*3/uL (ref 0.0–0.5)
Eosinophils Relative: 0 %
HCT: 21.9 % — ABNORMAL LOW (ref 39.0–52.0)
Hemoglobin: 7 g/dL — ABNORMAL LOW (ref 13.0–17.0)
Immature Granulocytes: 3 %
Lymphocytes Relative: 3 %
Lymphs Abs: 0.3 10*3/uL — ABNORMAL LOW (ref 0.7–4.0)
MCH: 28.7 pg (ref 26.0–34.0)
MCHC: 32 g/dL (ref 30.0–36.0)
MCV: 89.8 fL (ref 80.0–100.0)
Monocytes Absolute: 1.2 10*3/uL — ABNORMAL HIGH (ref 0.1–1.0)
Monocytes Relative: 10 %
Neutro Abs: 9.3 10*3/uL — ABNORMAL HIGH (ref 1.7–7.7)
Neutrophils Relative %: 84 %
Platelets: 23 10*3/uL — CL (ref 150–400)
RBC: 2.44 MIL/uL — ABNORMAL LOW (ref 4.22–5.81)
RDW: 15 % (ref 11.5–15.5)
WBC: 11.1 10*3/uL — ABNORMAL HIGH (ref 4.0–10.5)
nRBC: 0 % (ref 0.0–0.2)

## 2019-06-26 LAB — COMPREHENSIVE METABOLIC PANEL
ALT: 13 U/L (ref 0–44)
AST: 14 U/L — ABNORMAL LOW (ref 15–41)
Albumin: 3.5 g/dL (ref 3.5–5.0)
Alkaline Phosphatase: 67 U/L (ref 38–126)
Anion gap: 8 (ref 5–15)
BUN: 25 mg/dL — ABNORMAL HIGH (ref 6–20)
CO2: 25 mmol/L (ref 22–32)
Calcium: 8.9 mg/dL (ref 8.9–10.3)
Chloride: 108 mmol/L (ref 98–111)
Creatinine, Ser: 1.76 mg/dL — ABNORMAL HIGH (ref 0.61–1.24)
GFR calc Af Amer: 50 mL/min — ABNORMAL LOW (ref >60)
GFR calc non Af Amer: 43 mL/min — ABNORMAL LOW (ref >60)
Glucose, Bld: 85 mg/dL (ref 70–99)
Potassium: 4.2 mmol/L (ref 3.5–5.1)
Sodium: 141 mmol/L (ref 135–145)
Total Bilirubin: 0.4 mg/dL (ref 0.3–1.2)
Total Protein: 6.7 g/dL (ref 6.5–8.1)

## 2019-06-26 LAB — URINALYSIS, ROUTINE W REFLEX MICROSCOPIC
Bilirubin Urine: NEGATIVE
Glucose, UA: NEGATIVE mg/dL
Hgb urine dipstick: NEGATIVE
Ketones, ur: NEGATIVE mg/dL
Leukocytes,Ua: NEGATIVE
Nitrite: NEGATIVE
Protein, ur: NEGATIVE mg/dL
Specific Gravity, Urine: 1.016 (ref 1.005–1.030)
pH: 7 (ref 5.0–8.0)

## 2019-06-26 LAB — MAGNESIUM: Magnesium: 1.7 mg/dL (ref 1.7–2.4)

## 2019-06-26 LAB — CK: Total CK: 46 U/L — ABNORMAL LOW (ref 49–397)

## 2019-06-26 MED ORDER — FENTANYL CITRATE (PF) 100 MCG/2ML IJ SOLN
50.0000 ug | Freq: Once | INTRAMUSCULAR | Status: AC
  Administered 2019-06-26: 23:00:00 50 ug via INTRAVENOUS
  Filled 2019-06-26: qty 2

## 2019-06-26 MED ORDER — OXYCODONE-ACETAMINOPHEN 5-325 MG PO TABS: 1.0000 | ORAL_TABLET | Freq: Three times a day (TID) | ORAL | 0 refills | Status: DC | PRN

## 2019-06-26 MED ORDER — ONDANSETRON HCL 4 MG/2ML IJ SOLN
4.0000 mg | Freq: Once | INTRAMUSCULAR | Status: AC
  Administered 2019-06-26: 23:00:00 4 mg via INTRAVENOUS
  Filled 2019-06-26: qty 2

## 2019-06-26 MED ORDER — SODIUM CHLORIDE 0.9 % IV BOLUS
500.0000 mL | Freq: Once | INTRAVENOUS | Status: AC
  Administered 2019-06-26: 23:00:00 500 mL via INTRAVENOUS

## 2019-06-26 NOTE — ED Notes (Signed)
Urine and culture sent to lab  

## 2019-06-26 NOTE — Telephone Encounter (Signed)
Spoke with patient about updates on referral to [facility] oncology and he stated that they called him yesterday and has appointment set up for next week.  Informed that had to get referral resent to pain management with Dr. Mechele Dawley.  Patient requesting referral for his pain medication.  States that he has been having increased pain and has been taking more than prescribed due to the increased pain. Will reach out to Dr. Worthy Flank office about patient's request. Mariel Gaudin K. Olena Heckle NP

## 2019-06-26 NOTE — ED Triage Notes (Signed)
Cancer patient here with complaints of pain all over increased in bilateral legs and back. Also reports nausea. Chemo and radiation treatment.

## 2019-06-26 NOTE — Telephone Encounter (Signed)
At the direction of Amy NP, phone call placed to patient to provide update that message for refill of percocet was sent to Dr. Julien Nordmann. Amy also requested that patient be encouraged to go to ED if pain is severe.

## 2019-06-26 NOTE — Telephone Encounter (Signed)
At the request of Andrew West, Dr. Julien Nordmann and Diane RN updated that patient is requesting a refill of his percocet.

## 2019-06-26 NOTE — ED Provider Notes (Signed)
[facility] DEPT Provider Note   CSN: [id number] Arrival date & time: 06/26/19  2126     History   Chief Complaint Chief Complaint  Patient presents with  . Leg Pain  . Back Pain  . Nausea    HPI Andrew West is a 53 y.o. 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 Patient states pain is located to the diffuse lower back & to the entirety of the bilateral lower extremities. Pain feels like burning & aching. He states he has mets in his back & legs which causes pain @ baseline, this has similar quality but feels worse in severity. Worse with movement. NO alleviating factors. Tried his at home percocet without much relief. He states he feels generally weak with nausea which is not new for him. Had 1 episode of emesis earlier today. Denies fever, chills, chest pain, dyspnea, hematemesis, abdominal pain, diarrhea, or melena. Denies incontinence to bowel/bladder, saddle anesthesia, numbness, focal weakness, or recent traumatic injury/ change in activity.      HPI  Past Medical History:  Diagnosis Date  . Anxiety   . CAD (coronary artery disease)    a. s/p multiple PCIs with last cath 11/2016 with severe multivessel CAD, s/p PCTA to LCx but unable to pass stent  . Cancer ([place])   . Chronic leg pain    bilateral  . Chronic lower back pain   . COPD (chronic obstructive pulmonary disease) ([place])   . Depression   . GERD (gastroesophageal reflux disease)    Takes Dexilant  . HLD (hyperlipidemia)   . Hypertension   . Rhabdomyolysis    h/o, r/t statins  . Sleep apnea    "can't tolerate mask" (12/16/2016)  . Type II diabetes mellitus [facility])      Patient Active Problem List   Diagnosis Date Noted  . Chemotherapy-induced thrombocytopenia 06/05/2019  . Neutropenic fever ([place]) 05/23/2019  . Bone metastases ([place]) 05/10/2019  . Pain from bone metastases ([place]) 05/10/2019  . Port-A-Cath in place 05/07/2019  . Protein-calorie malnutrition, severe 12/26/2018  . Symptomatic anemia 12/25/2018  . Syncope 12/25/2018  . CKD (chronic kidney disease) stage 3, GFR 30-59 ml/min (HCC) 12/09/2018  . Radiation esophagitis   . Malnutrition of moderate degree 12/06/2018  . Esophageal stricture   . N&V (nausea and vomiting) 12/01/2018  . Antineoplastic chemotherapy induced pancytopenia ([place]) 12/01/2018  . Odynophagia   . Dysphagia 10/08/2018  . Hypokalemia 10/08/2018  . Hypomagnesemia 10/08/2018  . Type 2 diabetes mellitus ([place]) 10/08/2018  . Anemia 10/08/2018  . Sleep apnea 10/08/2018  . 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[place]) 08/31/2018  . Goals of care, counseling/discussion 08/31/2018  . Encounter for antineoplastic chemotherapy 08/31/2018  . Encounter for smoking cessation counseling 08/31/2018  . Malignant neoplasm of bronchus of right upper lobe ([place]) 08/29/2018  . Mediastinal mass 08/17/2018  . Vitamin D deficiency 08/04/2018  . Diabetic peripheral neuropathy ([place]) 06/30/2018  . Substernal chest pain 02/25/2018  . Benign prostatic hyperplasia with urinary hesitancy 11/17/2017  . Preoperative cardiovascular examination 07/12/2017  . Refractory angina ([place]) 05/24/2017  . Complex regional pain syndrome type I 03/24/2017  . Progressive angina (  [place]) -Class III 12/16/2016  . Anxiety 06/28/2016  . Diastolic dysfunction-grade 2 with EF 60-65% Oct 2016 03/22/2016  . Hypertension 10/03/2015  . CAD S/P multiple PCI's 10/03/2015  . Pulmonary nodule 06/24/2015  . Cough 06/24/2015  . COPD with asthma ([place]) 06/24/2015  . Reactive airway disease 06/04/2015  . DOE (dyspnea on exertion) 04/15/2015  . Chronic low back pain  02/25/2014  . Lumbar disc herniation with radiculopathy 03/31/2012  . Presence of stent in left circumflex coronary artery 10/18/2011    Class: History of  . TOBACCO ABUSE 02/27/2009  . ABDOMINAL PAIN, LEFT LOWER QUADRANT 12/30/2008  . ABDOMINAL PAIN, EPIGASTRIC 12/05/2008  . Anxiety and depression 09/05/2008  . Hereditary and idiopathic peripheral neuropathy 09/05/2008  . GERD 09/05/2008  . Cervical disc disorder with radiculopathy of cervical region 08/19/2008  . HLD (hyperlipidemia) 04/25/2008    Class: Diagnosis of  . ALLERGIC RHINITIS 04/25/2008  . Backache 04/25/2008  . Chest pain, unspecified 04/25/2008  . COLONIC POLYPS, HX OF 04/25/2008  . Obesity 04/18/2008    Class: Diagnosis of  . ANAL FISSURE, HX OF 04/18/2008  . Diabetes mellitus ([place]) 01/30/2008    Class: History of  . RECTAL BLEEDING 01/30/2008    Past Surgical History:  Procedure Laterality Date  . BACK SURGERY    . BRONCHIAL BIOPSY  08/25/2018   Procedure: BRONCHIAL BIOPSIES;  Surgeon: Garner Nash, DO;  Location: [facility];  Service: Cardiopulmonary;;  . CARDIAC CATHETERIZATION N/A 09/25/2015   Procedure: Left Heart Cath and Coronary Angiography;  Surgeon: Leonie Man, MD;  Location: [place] CV LAB;  Service: Cardiovascular;  Laterality: N/A;  . CARDIAC CATHETERIZATION N/A 12/16/2016   Procedure: Left Heart Cath and Coronary Angiography;  Surgeon: Leonie Man, MD;  Location: [place] CV LAB;  Service: Cardiovascular;  Laterality: N/A;  . CARDIAC CATHETERIZATION N/A 12/16/2016   Procedure: Coronary Balloon Angioplasty;  Surgeon: Leonie Man, MD;  Location: [place] CV LAB;  Service: Cardiovascular;  Laterality: N/A;  . COLONOSCOPY W/ POLYPECTOMY    . CORONARY ANGIOPLASTY  09/25/2015   mid cir & om  . CORONARY ANGIOPLASTY WITH STENT PLACEMENT  10/09/2001   PTCA & stenting of mid AV circumflex; 2.5x85m Pixel stent  . CORONARY ANGIOPLASTY WITH STENT PLACEMENT  12/13/2001   PCI with  stent to mid L circumflex, 95% stenosis to 0% residual  . CORONARY ANGIOPLASTY WITH STENT PLACEMENT  10/10/2003   PCI to mid AV circumflex; LAD 30% disease; RCA 100% occluded prox.  . CORONARY ANGIOPLASTY WITH STENT PLACEMENT  09/01/2011   PCI with stenting with bare metal stent to mid AV groove circumflex and PDA  . CORONARY ANGIOPLASTY WITH STENT PLACEMENT  10/17/2011   cutting balloon angioplasty of ostial lateral OM1 branch and bifurcation AV groove circumflex OM junction; stenosis reduced to 0%  . ENDOBRONCHIAL ULTRASOUND Bilateral 08/25/2018   Procedure: ENDOBRONCHIAL ULTRASOUND;  Surgeon: IGarner Nash DO;  Location: [facility];  Service: Cardiopulmonary;  Laterality: Bilateral;  . ESOPHAGOGASTRODUODENOSCOPY (EGD) WITH PROPOFOL N/A 12/08/2018   Procedure: ESOPHAGOGASTRODUODENOSCOPY (EGD) WITH PROPOFOL;  Surgeon: JMilus Banister MD;  Location: [facility];  Service: Endoscopy;  Laterality: N/A;  . EXCISIONAL HEMORRHOIDECTOMY    . FINE NEEDLE ASPIRATION  08/25/2018   Procedure: FINE NEEDLE ASPIRATION;  Surgeon: IGarner Nash DO;  Location: [facility];  Service: Cardiopulmonary;;  . FLEXIBLE BRONCHOSCOPY  08/25/2018   Procedure: FLEXIBLE BRONCHOSCOPY;  Surgeon: IGarner Nash DO;  Location: [facility];  Service: Cardiopulmonary;;  .  IR GASTROSTOMY TUBE MOD SED  12/11/2018  . IR GASTROSTOMY TUBE MOD SED  05/17/2019  . IR GASTROSTOMY TUBE REMOVAL  03/22/2019  . IR IMAGING GUIDED PORT INSERTION  09/29/2018  . LEFT HEART CATHETERIZATION WITH CORONARY ANGIOGRAM N/A 10/18/2011   Procedure: LEFT HEART CATHETERIZATION WITH CORONARY ANGIOGRAM;  Surgeon: Leonie Man, MD;  Location: [facility] CATH LAB;  Service: Cardiovascular;  Laterality: N/A;  . LUMBAR LAMINECTOMY/DECOMPRESSION MICRODISCECTOMY  03/31/2012   Procedure: LUMBAR LAMINECTOMY/DECOMPRESSION MICRODISCECTOMY 1 LEVEL;  Surgeon: Charlie Pitter, MD;  Location: [place] NEURO ORS;  Service: Neurosurgery;  Laterality: Left;  . TRANSTHORACIC  ECHOCARDIOGRAM  07/28/2011   EF 55-65%; LVH, grade 1 diastolic dysfunction;         Home Medications    Prior to Admission medications   Medication Sig Start Date End Date Taking? Authorizing Provider  Accu-Chek FastClix Lancets MISC Use as directed to test blood sugar once daily. DX E11.9 05/23/19   [name], MD  acetaminophen (TYLENOL) 500 MG tablet Take 1,000 mg by mouth every 6 (six) hours as needed for moderate pain.    [provider]  Blood Glucose Monitoring Suppl (ACCU-CHEK GUIDE) w/Device KIT 1 each by Does not apply route daily. Use as directed to test blood sugar once daily. DX E11.9 05/23/19   [name], MD  fentaNYL (DURAGESIC) 25 MCG/HR Place 1 patch onto the skin every 3 (three) days. 05/31/19   Curt Bears, MD  glucose blood (ACCU-CHEK GUIDE) test strip Use as directed to test blood sugar once daily. DX E11.9 05/23/19   [name], MD  lidocaine-prilocaine (EMLA) cream Apply 1 application topically as needed. 06/05/19   [name], [name], PA-C  LORazepam (ATIVAN) 0.5 MG tablet 1 tab po q 4-6 hours prn or 1 tab po 30 minutes prior to radiation Patient taking differently: Take 0.5 mg by mouth every 6 (six) hours as needed for anxiety or sedation. 1 tab po q 4-6 hours prn or 1 tab po 30 minutes prior to radiation 08/30/18   Hayden Pedro, PA-C  megestrol (MEGACE ES) 625 MG/5ML suspension Take 5 mLs (625 mg total) by mouth daily. 06/20/19   Kyung Rudd, MD  megestrol (MEGACE) 20 MG tablet Take 1 tablet (20 mg total) by mouth daily. 06/20/19   [name], MD  mirtazapine (REMERON) 30 MG tablet Take 1 tablet (30 mg total) by mouth at bedtime. 04/26/19   [name], MD  nebivolol (BYSTOLIC) 5 MG tablet Take 1 tablet (5 mg total) by mouth daily. 03/19/19   Hilty, Nadean Corwin, MD  nitroGLYCERIN (NITROSTAT) 0.4 MG SL tablet Place 1 tablet (0.4 mg total) under the tongue every 5 (five) minutes as needed for chest pain. Patient not taking:  Reported on 06/13/2019 03/19/19   Pixie Casino, MD  oxyCODONE-acetaminophen (PERCOCET/ROXICET) 5-325 MG tablet Take 1 tablet by mouth every 8 (eight) hours as needed for severe pain. 06/26/19   Curt Bears, MD  pantoprazole (PROTONIX) 40 MG tablet Take 1 tablet (40 mg total) by mouth daily. 03/20/19   [name], MD  prochlorperazine (COMPAZINE) 10 MG tablet Take 1 tablet (10 mg total) by mouth every 6 (six) hours as needed for nausea or vomiting. Patient not taking: Reported on 06/13/2019 06/05/19   [name], [name], PA-C  ranolazine (RANEXA) 1000 MG SR tablet Take 1 tablet (1,000 mg total) by mouth 2 (two) times daily. 05/24/17   Hilty, Nadean Corwin, MD  rosuvastatin (CRESTOR) 20 MG tablet Take 1 tablet (20 mg total)  by mouth daily. 03/20/19   [name], MD  sucralfate (CARAFATE) 1 g tablet Take 1 tablet by mouth 4 (four) times daily -  before meals and at bedtime. 09/23/18   [provider]  tiotropium (SPIRIVA HANDIHALER) 18 MCG inhalation capsule Place 1 capsule (18 mcg total) into inhaler and inhale daily. 03/20/19   [name], MD  vitamin B-12 (CYANOCOBALAMIN) 500 MCG tablet Take 1 tablet (500 mcg total) by mouth daily. 06/30/18   Jamse Arn, MD  zolpidem (AMBIEN) 5 MG tablet Take 1 tablet (5 mg total) by mouth at bedtime. 03/20/19   [name], MD    Family History Family History  Problem Relation Age of Onset  . Heart attack Father   . Hypertension Mother   . Diabetes Mother   . Heart disease Brother        x 3   . Heart attack Brother        deceased  . Hypertension Sister   . Diabetes Sister   . Anesthesia problems Neg Hx   . Hypotension Neg Hx   . Malignant hyperthermia Neg Hx   . Pseudochol deficiency Neg Hx     Social History Social History   Tobacco Use  . Smoking status: Former Smoker    Packs/day: 0.25    Years: 25.00    Pack years: 6.25    Types: Cigarettes    Quit date: 05/24/2015    Years since quitting: 4.0  .  Smokeless tobacco: Never Used  Substance Use Topics  . Alcohol use: No    Alcohol/week: 0.0 standard drinks  . Drug use: No     Allergies   Iohexol   Review of Systems Review of Systems  Constitutional: Positive for fatigue. Negative for chills and fever.  Respiratory: Negative for shortness of breath.   Cardiovascular: Negative for chest pain, palpitations and leg swelling.  Gastrointestinal: Positive for nausea and vomiting. Negative for abdominal pain, anal bleeding, blood in stool, constipation and diarrhea.  Genitourinary: Negative for dysuria.  Musculoskeletal: Positive for back pain and myalgias.  Neurological: Negative for dizziness, syncope, speech difficulty, weakness, numbness and headaches.  All other systems reviewed and are negative.  Physical Exam Updated Vital Signs BP 113/78 (BP Location: Right Arm)   Pulse 89   Temp 99 F (37.2 C) (Oral)   Resp (!) 22   SpO2 100%   Physical Exam Vitals signs and nursing note reviewed.  Constitutional:      General: He is not in acute distress.    Appearance: He is well-developed. He is not toxic-appearing.  HENT:     Head: Normocephalic and atraumatic.  Eyes:     General:        Right eye: No discharge.        Left eye: No discharge.     Extraocular Movements: Extraocular movements intact.     Conjunctiva/sclera: Conjunctivae normal.     Pupils: Pupils are equal, round, and reactive to light.  Neck:     Musculoskeletal: Normal range of motion and neck supple. No spinous process tenderness or muscular tenderness.  Cardiovascular:     Rate and Rhythm: Normal rate and regular rhythm.     Pulses:          Dorsalis pedis pulses are 2+ on the right side and 2+ on the left side.       Posterior tibial pulses are 2+ on the right side and 2+ on the left side.  Pulmonary:  Effort: Pulmonary effort is normal. No respiratory distress.     Breath sounds: Normal breath sounds. No wheezing, rhonchi or rales.  Chest:      Comments: Left chest wall port present without overlying erythema/warmth  Abdominal:     General: There is no distension.     Palpations: Abdomen is soft.     Tenderness: There is no abdominal tenderness. There is no guarding or rebound.     Comments: PEG tube in place without surrounding erythema/warmth/purulent drainage.   Musculoskeletal:     Comments: No obvious deformity, appreciable swelling, erythema, ecchymosis, significant open wounds, or increased warmth. No edema.  Lower Extremities: Normal ROM. No point/focal bony tenderness tenderness. Mild generalized tenderness. No focal calf tenderness. compartments are soft.   Back: No point/focal vertebral tenderness, no palpable step off or crepitus. Diffusely tender throughout L spine area.   Skin:    General: Skin is warm and dry.     Findings: No rash.  Neurological:     Mental Status: He is alert.     Comments: Sensation grossly intact to bilateral lower extremities. 5/5 symmetric strength with plantar/dorsiflexion bilaterally. Gait is intact without obvious foot drop.   Psychiatric:        Behavior: Behavior normal.    ED Treatments / Results  Labs (all labs ordered are listed, but only abnormal results are displayed) Labs Reviewed  CBC WITH DIFFERENTIAL/PLATELET - Abnormal; Notable for the following components:      Result Value   WBC 11.1 (*)    RBC 2.44 (*)    Hemoglobin 7.0 (*)    HCT 21.9 (*)    Platelets 23 (*)    Neutro Abs 9.3 (*)    Lymphs Abs 0.3 (*)    Monocytes Absolute 1.2 (*)    Abs Immature Granulocytes 0.29 (*)    All other components within normal limits  COMPREHENSIVE METABOLIC PANEL - Abnormal; Notable for the following components:   BUN 25 (*)    Creatinine, Ser 1.76 (*)    AST 14 (*)    GFR calc non Af Amer 43 (*)    GFR calc Af Amer 50 (*)    All other components within normal limits  CK - Abnormal; Notable for the following components:   Total CK 46 (*)    All other components within normal  limits  MAGNESIUM  URINALYSIS, ROUTINE W REFLEX MICROSCOPIC    EKG None  Radiology No results found.  Procedures Procedures (including critical care time)  Medications Ordered in ED Medications - No data to display   Initial Impression / Assessment and Plan / ED Course  I have reviewed the triage vital signs and the nursing notes.  Pertinent labs & imaging results that were available during my care of the patient were reviewed by me and considered in my medical decision making (see chart for details).   Patient presents to the ED with complaints of acute on chronic lower back & BLE pain. Also has some continued nausea w/ 1 episode of vomiting & fatigue. Nontoxic appearing, no apparent distress, vitals without significant abnormality. Borderline oral temp @ 99, rechecked by me and is 98.5. Patient has diffuse tenderness. No point/focal tenderness or recent trauma. No significant change in quality of pain, known mets. No neuro deficits. Proceed w/ pain control & labs.   CBC: Leukocytosis at 11.1 with mild left shift, no neutropenia.  Hemoglobin low, somewhat similar to prior.  Thrombocytopenia at 23, had similar on record 3  weeks prior, per oncology note 7/24-plan to defer treatment for 1 week and continue to monitor, give platelets if active bleeding, patient not actively bleeding currently. CMP: Creatinine/BUN somewhat increased from prior, fluids in the ER.  No significant electrolyte derangement. Magnesium: Within normal limits CK: Not elevated, not consistent with rhabdomyolysis. Urinalysis: No UTI, or hematuria.  Patient afebrile, no neutropenia, H&P does not seem consistent with infectious process currently. Thrombocytopenia which he has had similarly 07/14, no active bleeding currently, will need close follow up with oncology. Anemia, somewhat similar hgbs in the past- again will need close follow up.  Elevated creatinine, has received fluids.  Patient feeling improved  following pain management in the emergency department.  He is requesting discharge home.  He has run out of his Percocet which he takes at home, feel it is reasonable to give him a few tablets to hold him over for the next couple of days. [place] Controlled Substance reporting System queried.  He will need extremely close follow-up with his oncologist. I discussed results, treatment plan, need for follow-up, and return precautions with the patient & his wife. Provided opportunity for questions, patient & his wife confirmed understanding and are in agreement with plan.   Findings and plan of care discussed with supervising physician Dr. Leonides Schanz who is in agreement.   Final Clinical Impressions(s) / ED Diagnoses   Final diagnoses:  Bilateral low back pain without sciatica, unspecified chronicity  Thrombocytopenia (HCC)  Elevated serum creatinine    ED Discharge Orders         Ordered    oxyCODONE-acetaminophen (PERCOCET/ROXICET) 5-325 MG tablet  Every 6 hours PRN     06/27/19 0104           , Glynda Jaeger, PA-C 06/27/19 0113    Ward, Delice Bison, DO 06/27/19 0215

## 2019-06-26 NOTE — Progress Notes (Signed)
Nutrition  Called patient for nutrition follow-up.  Patient answered and reported that he was not feeling good and wanted to call RD back.  Reports that he has RD phone number and will return call.   [name] B. Zenia Resides, [place], [place] Registered Dietitian [phone] (pager)

## 2019-06-26 NOTE — Telephone Encounter (Signed)
Message sent to PCP to provide update that pain clinic said that they had not received referral. Fax number provided to pain clinic.

## 2019-06-27 ENCOUNTER — Telehealth: Payer: Self-pay | Admitting: *Deleted

## 2019-06-27 ENCOUNTER — Ambulatory Visit (HOSPITAL_COMMUNITY)
Admission: RE | Admit: 2019-06-27 | Discharge: 2019-06-27 | Disposition: A | Discharge status: Home / Self Care | Payer: Medicare Other | Source: Ambulatory Visit | Attending: Internal Medicine | Admitting: Internal Medicine

## 2019-06-27 ENCOUNTER — Encounter (HOSPITAL_COMMUNITY): Payer: Self-pay

## 2019-06-27 ENCOUNTER — Inpatient Hospital Stay (HOSPITAL_COMMUNITY): Admission: RE | Admit: 2019-06-27 | Payer: Medicare Other | Source: Ambulatory Visit

## 2019-06-27 ENCOUNTER — Other Ambulatory Visit: Payer: Self-pay | Admitting: Internal Medicine

## 2019-06-27 DIAGNOSIS — C7949 Secondary malignant neoplasm of other parts of nervous system: Secondary | ICD-10-CM | POA: Diagnosis not present

## 2019-06-27 DIAGNOSIS — C3490 Malignant neoplasm of unspecified part of unspecified bronchus or lung: Secondary | ICD-10-CM | POA: Diagnosis not present

## 2019-06-27 DIAGNOSIS — C3411 Malignant neoplasm of upper lobe, right bronchus or lung: Secondary | ICD-10-CM

## 2019-06-27 DIAGNOSIS — C787 Secondary malignant neoplasm of liver and intrahepatic bile duct: Secondary | ICD-10-CM | POA: Diagnosis not present

## 2019-06-27 DIAGNOSIS — M545 Low back pain: Secondary | ICD-10-CM | POA: Diagnosis not present

## 2019-06-27 DIAGNOSIS — C782 Secondary malignant neoplasm of pleura: Secondary | ICD-10-CM | POA: Diagnosis not present

## 2019-06-27 MED ORDER — OXYCODONE-ACETAMINOPHEN 5-325 MG PO TABS: 1.0000 | ORAL_TABLET | Freq: Four times a day (QID) | ORAL | 0 refills | Status: DC | PRN

## 2019-06-27 MED ORDER — FENTANYL 50 MCG/HR TD PT72: 1.0000 | MEDICATED_PATCH | TRANSDERMAL | 0 refills | Status: DC

## 2019-06-27 MED ORDER — MORPHINE SULFATE (PF) 4 MG/ML IV SOLN
4.0000 mg | Freq: Once | INTRAVENOUS | Status: AC
  Administered 2019-06-27: 01:00:00 4 mg via INTRAVENOUS
  Filled 2019-06-27: qty 1

## 2019-06-27 MED ORDER — SODIUM CHLORIDE 0.9 % IV BOLUS
500.0000 mL | Freq: Once | INTRAVENOUS | Status: AC
  Administered 2019-06-27: 01:00:00 500 mL via INTRAVENOUS

## 2019-06-27 MED ORDER — HEPARIN SOD (PORK) LOCK FLUSH 100 UNIT/ML IV SOLN
500.0000 [IU] | Freq: Once | INTRAVENOUS | Status: AC
  Administered 2019-06-27: 500 [IU]
  Filled 2019-06-27: qty 5

## 2019-06-27 MED FILL — OXYCODONE-ACETAMINOPHEN 5-3: 5-325 | 7 days supply | Qty: 30 | Fill #0

## 2019-06-27 MED FILL — fentaNYL 50 MCG/HR PT72: 50 | 15 days supply | Qty: 5 | Fill #0

## 2019-06-27 NOTE — Discharge Instructions (Addendum)
You were seen in the ER today today for back and leg pain. Your labs show that your platelet count was low and your creatinine (a measure of kidney function) was high.  Please have these were closely monitored by your oncologist, please make them aware of these results tomorrow.  We are sending you in a few tablets of Percocet to take for severe pain. -Percocet-this is a narcotic/controlled substance medication that has potential addicting qualities.  We recommend that you take 1-2 tablets every 6 hours as needed for severe pain.  Do not drive or operate heavy machinery when taking this medicine as it can be sedating. Do not drink alcohol or take other sedating medications when taking this medicine for safety reasons.  Keep this out of reach of small children.  Please be aware this medicine has Tylenol in it (325 mg/tab) do not exceed the maximum dose of Tylenol in a day per over the counter recommendations should you decide to supplement with Tylenol over the counter.   We have prescribed you new medication(s) today. Discuss the medications prescribed today with your pharmacist as they can have adverse effects and interactions with your other medicines including over the counter and prescribed medications. Seek medical evaluation if you start to experience new or abnormal symptoms after taking one of these medicines, seek care immediately if you start to experience difficulty breathing, feeling of your throat closing, facial swelling, or rash as these could be indications of a more serious allergic reaction  Please follow-up with your oncologist by calling tomorrow morning, please return to the ER for new or worsening symptoms including but not limited to worsening pain, passing out, bleeding, blood in your stool, blood in your urine, passing out, chest pain, trouble breathing, fever, or any other concerns.

## 2019-06-27 NOTE — Telephone Encounter (Signed)
Received call from pt's home health nurse, Amy.  She called to discuss pain medications. Pt had gone to ED last night for severe pain. See ED note. Pt did go this am for CT scan of chest.  Pt has a f/u appt with Dr. Julien Nordmann on 07/03/19 to review scan and discuss treatment options. Explained to Amy, RN that it is unclear if patient is using fentanyl patches.  These were last refilled on 05/31/19 for 25 mcg patches # 5.  On 06/07/19 pt was instructed to increase to 2 patches every 3 days. He has not called for refill on those per review of medication list/orders.  A refill of his percocet 5/325 #45 was sent to his pharmacy on 06/26/19 per Dr. Julien Nordmann. Pt's pain will be re-evaluated on his next visit to Dr. Julien Nordmann on 07/03/19 Advised Amy, RN that no further changes to his pain medicines will be done until that evaluation is done.

## 2019-06-27 NOTE — ED Notes (Signed)
Pt verbalized discharge instructions and follow up care. Alert and ambulatory. Port deaccessed. Wife driving pt home

## 2019-06-27 NOTE — Telephone Encounter (Signed)
Received call from patient to discuss his pain concerns and pain meds.  After lengthy conversation, pt is to contact this office for pain meds refill requests and not go through his home health nurse.  Apparently home health services were started when pt first had his PEG tube placed. Refills adjusted for percocet 5/325 1 tablet every 6 hours # 30 sent in today.  Fentanyl patch 50 mcg # 5 patches sent in today as well. [place] is aware of his appt next Tuesday with Dr. Julien Nordmann, review scan from this morning and for scheduled treatment.  Karas states he has an appt @ [facility] oncology on Thursday, 07/05/19 for 2nd opinion. Wilfred is aware of medication refills at [facility] and that they will be ready in an hour or so.

## 2019-06-28 ENCOUNTER — Ambulatory Visit: Payer: Medicare Other | Admitting: Internal Medicine

## 2019-06-28 ENCOUNTER — Other Ambulatory Visit: Payer: Self-pay

## 2019-06-28 ENCOUNTER — Other Ambulatory Visit: Payer: Medicare Other | Admitting: Adult Health Nurse Practitioner

## 2019-06-28 DIAGNOSIS — Z515 Encounter for palliative care: Secondary | ICD-10-CM | POA: Diagnosis not present

## 2019-06-29 ENCOUNTER — Other Ambulatory Visit: Payer: Self-pay

## 2019-06-29 ENCOUNTER — Other Ambulatory Visit: Payer: Medicare Other | Admitting: Licensed Clinical Social Worker

## 2019-06-29 DIAGNOSIS — Z515 Encounter for palliative care: Secondary | ICD-10-CM

## 2019-06-29 NOTE — Progress Notes (Signed)
Therapist, nutritional Palliative Care Consult Note Telephone: [phone]  Fax: [phone]  PATIENT NAME: Andrew West MRN: [id number]  PRIMARY CARE PROVIDER:   [name], MD  REFERRING PROVIDER: Dr. Si Gaul  RESPONSIBLE PARTY:   Self [phone]      RECOMMENDATIONS and PLAN:  1.  Pain.  Still having uncontrolled pain.  Went to ED on 8/4 for evaluation of pain.  Lab work was at baseline.  Patient treated for pain and discharged.  Patient's pain related to lung cancer with mets to bone.  Does have appointment next week with [facility] oncology for second opinion.  Confirmed with RN clinical navigator that referral to new pain clinic in [place] has been received.  Patient has not heard from them yet.  Will welcome any recommendations from them as patient has tried several interventions for his pain without relief.  2.  Appetite.  Patient states that he has had his megace switched from liquid to pill form and that it is working.  States that he has had increased appetite and has been eating orally for the past 2 days without N/V.  States that he has not used the formula in his PEG tube but that he thinks he will have one can tonight to supplement.  Continue oral nutrition as tolerated and supplement with tube feedings.  May need to have consultation with dietician to advise on how to properly manage oral intake with tube feedings.    3.  Depression.  Still having depression but feels like it is a little better.  Probably due to being able to have oral intake again and enjoy the taste of food.  Still need to monitor for SI, which he denies at this time.  Is on remeron 30mg .  Could add zoloft or celexa to help with depression if patient wants this.  He has not wanted any additional help with depression but may want it in the future.  4.  Goals of care.  Patient is full code and wishes to be here as long as he can for his  family.  Is not ready to give up on chemo but also understands that his cancer is going to shorten his life.  Is willing to have hospice when the time comes.  Left MOST form for he and his wife to go over have appointment in a couple of weeks to go over further and fill out.  I spent 60 minutes providing this consultation,  from 4:00 to 5:00. More than 50% of the time in this consultation was spent coordinating communication.   HISTORY OF PRESENT ILLNESS:  Andrew West is a 53 y.o. year old male with multiple medical problems including small cell lung cancer of RUL, CAD, DMT2, HTN, OSA, COPD. Palliative Care was asked to help address goals of care.   CODE STATUS: Full Code  PPS: 60% HOSPICE ELIGIBILITY/DIAGNOSIS: TBD  PHYSICAL EXAM:   General: NAD, frail appearing, thin Extremities: no edema, no joint deformities Skin: no rashes Neurological: Weakness but otherwise nonfocal  PAST MEDICAL HISTORY:  Past Medical History:  Diagnosis Date  . Anxiety   . CAD (coronary artery disease)    a. s/p multiple PCIs with last cath 11/2016 with severe multivessel CAD, s/p PCTA to LCx but unable to pass stent  . Chronic leg pain    bilateral  . Chronic lower back pain   . COPD (chronic obstructive pulmonary disease) (HCC)   . Depression   .  GERD (gastroesophageal reflux disease)    Takes Dexilant  . HLD (hyperlipidemia)   . Hypertension   . 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 Rhabdomyolysis    h/o, r/t statins  . Sleep apnea    "can't tolerate mask" (12/16/2016)  . Type II diabetes mellitus (HCC)     SOCIAL HX:  Social History   Tobacco Use  . Smoking status: Former Smoker    Packs/day: 0.25    Years: 25.00    Pack years: 6.25    Types: Cigarettes    Quit date: 05/24/2015    Years since quitting: 4.1  . Smokeless tobacco: Never Used  Substance Use Topics  . Alcohol use: No    Alcohol/week: 0.0 standard drinks     ALLERGIES:  Allergies  Allergen Reactions  . Iohexol Anaphylaxis    PT. TO BE PREMEDICATED PRIOR TO IV CONTRAST PER DR Eppie Gibson /MMS//12/15/15Desc: PT BECAME SOB AND CHEST TIGHTNESS AFTER CONTRAST INJECTION.  Ardis Hughs., Onset Date: [id number]      PERTINENT MEDICATIONS:  Outpatient Encounter Medications as of 06/28/2019  Medication Sig  . Accu-Chek FastClix Lancets MISC Use as directed to test blood sugar once daily. DX E11.9  . acetaminophen (TYLENOL) 500 MG tablet Take 1,000 mg by mouth every 6 (six) hours as needed for moderate pain.  . Blood Glucose Monitoring Suppl (ACCU-CHEK GUIDE) w/Device KIT 1 each by Does not apply route daily. Use as directed to test blood sugar once daily. DX E11.9  . fentaNYL (DURAGESIC) 50 MCG/HR Place 1 patch onto the skin every 3 (three) days.  Marland Kitchen glucose blood (ACCU-CHEK GUIDE) test strip Use as directed to test blood sugar once daily. DX E11.9  . lidocaine-prilocaine (EMLA) cream Apply 1 application topically as needed. (Patient taking differently: Apply 1 application topically as needed (access). )  . LORazepam (ATIVAN) 0.5 MG tablet 1 tab po q 4-6 hours prn or 1 tab po 30 minutes prior to radiation (Patient taking differently: Take 0.5 mg by mouth every 6 (six) hours as needed for anxiety or sedation. 1 tab po q 4-6 hours prn or 1 tab po 30 minutes prior to radiation)  . megestrol (MEGACE ES) 625 MG/5ML suspension Take 5 mLs (625 mg total) by mouth daily.  . megestrol (MEGACE) 20 MG tablet Take 1 tablet (20 mg total) by mouth daily.  . mirtazapine (REMERON) 30 MG tablet Take 1 tablet (30 mg total) by mouth at bedtime.  . nebivolol (BYSTOLIC) 5 MG tablet Take 1 tablet (5 mg total) by mouth daily.  . nitroGLYCERIN (NITROSTAT) 0.4 MG SL tablet Place 1 tablet (0.4 mg total) under the tongue every 5 (five) minutes as needed for chest pain.  Marland Kitchen oxyCODONE-acetaminophen (PERCOCET/ROXICET) 5-325 MG tablet Take 1 tablet by mouth every 6 (six) hours as needed for  severe pain.  . pantoprazole (PROTONIX) 40 MG tablet Take 1 tablet (40 mg total) by mouth daily.  . prochlorperazine (COMPAZINE) 10 MG tablet Take 1 tablet (10 mg total) by mouth every 6 (six) hours as needed for nausea or vomiting. (Patient not taking: Reported on 06/13/2019)  . ranolazine (RANEXA) 1000 MG SR tablet Take 1 tablet (1,000 mg total) by mouth 2 (two) times daily.  . rosuvastatin (CRESTOR) 20 MG tablet Take 1 tablet (20 mg total) by mouth daily.  . sucralfate (CARAFATE) 1 g tablet Take 1 tablet by mouth 4 (four) times daily -  before meals and  at bedtime.  Marland Kitchen tiotropium (SPIRIVA HANDIHALER) 18 MCG inhalation capsule Place 1 capsule (18 mcg total) into inhaler and inhale daily.  . vitamin B-12 (CYANOCOBALAMIN) 500 MCG tablet Take 1 tablet (500 mcg total) by mouth daily.  Marland Kitchen zolpidem (AMBIEN) 5 MG tablet Take 1 tablet (5 mg total) by mouth at bedtime.   No facility-administered encounter medications on file as of 06/28/2019.        Denesha Brouse Marlena Clipper, NP

## 2019-07-01 ENCOUNTER — Encounter (HOSPITAL_COMMUNITY): Payer: Self-pay

## 2019-07-01 ENCOUNTER — Other Ambulatory Visit: Payer: Self-pay

## 2019-07-01 DIAGNOSIS — M545 Low back pain: Secondary | ICD-10-CM | POA: Diagnosis not present

## 2019-07-01 DIAGNOSIS — Z79899 Other long term (current) drug therapy: Secondary | ICD-10-CM | POA: Diagnosis not present

## 2019-07-01 DIAGNOSIS — I2510 Atherosclerotic heart disease of native coronary artery without angina pectoris: Secondary | ICD-10-CM | POA: Insufficient documentation

## 2019-07-01 DIAGNOSIS — E1122 Type 2 diabetes mellitus with diabetic chronic kidney disease: Secondary | ICD-10-CM | POA: Diagnosis not present

## 2019-07-01 DIAGNOSIS — N183 Chronic kidney disease, stage 3 (moderate): Secondary | ICD-10-CM | POA: Diagnosis not present

## 2019-07-01 DIAGNOSIS — G8929 Other chronic pain: Secondary | ICD-10-CM | POA: Diagnosis not present

## 2019-07-01 DIAGNOSIS — J45909 Unspecified asthma, uncomplicated: Secondary | ICD-10-CM | POA: Insufficient documentation

## 2019-07-01 DIAGNOSIS — I129 Hypertensive chronic kidney disease with stage 1 through stage 4 chronic kidney disease, or unspecified chronic kidney disease: Secondary | ICD-10-CM | POA: Insufficient documentation

## 2019-07-01 DIAGNOSIS — Z87891 Personal history of nicotine dependence: Secondary | ICD-10-CM | POA: Insufficient documentation

## 2019-07-01 DIAGNOSIS — J449 Chronic obstructive pulmonary disease, unspecified: Secondary | ICD-10-CM | POA: Insufficient documentation

## 2019-07-01 DIAGNOSIS — M549 Dorsalgia, unspecified: Secondary | ICD-10-CM | POA: Diagnosis not present

## 2019-07-01 DIAGNOSIS — C3490 Malignant neoplasm of unspecified part of unspecified bronchus or lung: Secondary | ICD-10-CM | POA: Diagnosis not present

## 2019-07-01 NOTE — ED Triage Notes (Addendum)
Pt reports generalized pain especially in his legs and back. Also endorses nausea without vomiting. He is a cancer patient receiving chemo and radiation. Denies fever.

## 2019-07-02 ENCOUNTER — Emergency Department (HOSPITAL_COMMUNITY): Payer: Medicare Other

## 2019-07-02 ENCOUNTER — Emergency Department (HOSPITAL_COMMUNITY)
Admission: EM | Admit: 2019-07-02 | Discharge: 2019-07-02 | Disposition: A | Discharge status: Home / Self Care | Payer: Medicare Other | Attending: Emergency Medicine | Admitting: Emergency Medicine

## 2019-07-02 DIAGNOSIS — G8929 Other chronic pain: Secondary | ICD-10-CM

## 2019-07-02 DIAGNOSIS — M549 Dorsalgia, unspecified: Secondary | ICD-10-CM | POA: Diagnosis not present

## 2019-07-02 DIAGNOSIS — M545 Low back pain, unspecified: Secondary | ICD-10-CM

## 2019-07-02 DIAGNOSIS — C3480 Malignant neoplasm of overlapping sites of unspecified bronchus and lung: Secondary | ICD-10-CM | POA: Diagnosis not present

## 2019-07-02 DIAGNOSIS — C3490 Malignant neoplasm of unspecified part of unspecified bronchus or lung: Secondary | ICD-10-CM | POA: Diagnosis not present

## 2019-07-02 MED ORDER — MORPHINE SULFATE (PF) 4 MG/ML IV SOLN
4.0000 mg | Freq: Once | INTRAVENOUS | Status: AC
  Administered 2019-07-02: 03:00:00 4 mg via INTRAMUSCULAR
  Filled 2019-07-02: qty 1

## 2019-07-02 MED ORDER — MORPHINE SULFATE (PF) 4 MG/ML IV SOLN: 6.0000 mg | Freq: Once | INTRAVENOUS | Status: DC

## 2019-07-02 NOTE — ED Provider Notes (Signed)
[facility] DEPT Provider Note   CSN: [id number] Arrival date & time: 07/01/19  2250     History   Chief Complaint Chief Complaint  Patient presents with  . Leg Pain  . Back Pain    HPI Andrew West is a 53 y.o. male.     HPI  53 year old comes in a chief complaint of back pain.  Patient has history of lung cancer with mets to the bone.  He reports that he is having pain over his back, which is not new.  The pain is located over the lumbar spine and is radiating towards his leg.  The pain is similar to his prior pain flareup.  Pt has no associated numbness, weakness, urinary incontinence, urinary retention, bowel incontinence, pins and needle sensation in the perineal area.  Patient is undergoing chemo and radiation therapy.   Past Medical History:  Diagnosis Date  . Anxiety   . CAD (coronary artery disease)    a. s/p multiple PCIs with last cath 11/2016 with severe multivessel CAD, s/p PCTA to LCx but unable to pass stent  . Chronic leg pain    bilateral  . Chronic lower back pain   . COPD (chronic obstructive pulmonary disease) ([place])   . Depression   . GERD (gastroesophageal reflux disease)    Takes Dexilant  . HLD (hyperlipidemia)   . Hypertension   . 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 Rhabdomyolysis    h/o, r/t statins  . Sleep apnea    "can't tolerate mask" (12/16/2016)  . Type II diabetes mellitus [facility])     Patient Active Problem List   Diagnosis Date Noted  . Chemotherapy-induced thrombocytopenia 06/05/2019  . Neutropenic fever ([place]) 05/23/2019  . Bone metastases ([place]) 05/10/2019  . Pain from bone metastases ([place]) 05/10/2019  . Port-A-Cath in place 05/07/2019  . Protein-calorie malnutrition, severe 12/26/2018  . Symptomatic anemia 12/25/2018  . Syncope 12/25/2018  . CKD (chronic kidney disease) stage 3, GFR 30-59 ml/min (HCC) 12/09/2018  . Radiation esophagitis    . Malnutrition of moderate degree 12/06/2018  . Esophageal stricture   . N&V (nausea and vomiting) 12/01/2018  . Antineoplastic chemotherapy induced pancytopenia ([place]) 12/01/2018  . Odynophagia   . Dysphagia 10/08/2018  . Hypokalemia 10/08/2018  . Hypomagnesemia 10/08/2018  . Type 2 diabetes mellitus ([place]) 10/08/2018  . Anemia 10/08/2018  . Sleep apnea 10/08/2018  . 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[place]) 08/31/2018  . Goals of care, counseling/discussion 08/31/2018  . Encounter for antineoplastic chemotherapy 08/31/2018  . Encounter for smoking cessation counseling 08/31/2018  . Malignant neoplasm of bronchus of right upper lobe ([place]) 08/29/2018  . Mediastinal mass 08/17/2018  . Vitamin D deficiency 08/04/2018  . Diabetic peripheral neuropathy ([place]) 06/30/2018  . Substernal chest pain 02/25/2018  . Benign prostatic hyperplasia with urinary hesitancy 11/17/2017  . Preoperative cardiovascular examination 07/12/2017  . Refractory angina ([place]) 05/24/2017  . Complex regional pain syndrome type I 03/24/2017  . Progressive angina ([place]) -Class III 12/16/2016  . Anxiety 06/28/2016  . Diastolic dysfunction-grade 2 with EF 60-65% Oct 2016 03/22/2016  . Hypertension 10/03/2015  . CAD S/P multiple PCI's 10/03/2015  . Pulmonary nodule 06/24/2015  . Cough 06/24/2015  . COPD with asthma ([place]) 06/24/2015  . Reactive airway disease 06/04/2015  . DOE (dyspnea on exertion) 04/15/2015  . Chronic low back pain 02/25/2014  . Lumbar disc  herniation with radiculopathy 03/31/2012  . Presence of stent in left circumflex coronary artery 10/18/2011    Class: History of  . TOBACCO ABUSE 02/27/2009  . ABDOMINAL PAIN, LEFT LOWER QUADRANT 12/30/2008  . ABDOMINAL PAIN, EPIGASTRIC 12/05/2008  . Anxiety and depression 09/05/2008  . Hereditary and idiopathic peripheral neuropathy 09/05/2008  . GERD 09/05/2008  . Cervical disc disorder with radiculopathy of cervical region 08/19/2008  . HLD  (hyperlipidemia) 04/25/2008    Class: Diagnosis of  . ALLERGIC RHINITIS 04/25/2008  . Backache 04/25/2008  . Chest pain, unspecified 04/25/2008  . COLONIC POLYPS, HX OF 04/25/2008  . Obesity 04/18/2008    Class: Diagnosis of  . ANAL FISSURE, HX OF 04/18/2008  . Diabetes mellitus ([place]) 01/30/2008    Class: History of  . RECTAL BLEEDING 01/30/2008    Past Surgical History:  Procedure Laterality Date  . BACK SURGERY    . BRONCHIAL BIOPSY  08/25/2018   Procedure: BRONCHIAL BIOPSIES;  Surgeon: Garner Nash, DO;  Location: [facility];  Service: Cardiopulmonary;;  . CARDIAC CATHETERIZATION N/A 09/25/2015   Procedure: Left Heart Cath and Coronary Angiography;  Surgeon: Leonie Man, MD;  Location: [facility] CV LAB;  Service: Cardiovascular;  Laterality: N/A;  . CARDIAC CATHETERIZATION N/A 12/16/2016   Procedure: Left Heart Cath and Coronary Angiography;  Surgeon: Leonie Man, MD;  Location: [facility] LAB;  Service: Cardiovascular;  Laterality: N/A;  . CARDIAC CATHETERIZATION N/A 12/16/2016   Procedure: Coronary Balloon Angioplasty;  Surgeon: Leonie Man, MD;  Location: [facility] CV LAB;  Service: Cardiovascular;  Laterality: N/A;  . COLONOSCOPY W/ POLYPECTOMY    . CORONARY ANGIOPLASTY  09/25/2015   mid cir & om  . CORONARY ANGIOPLASTY WITH STENT PLACEMENT  10/09/2001   PTCA & stenting of mid AV circumflex; 2.5x102m Pixel stent  . CORONARY ANGIOPLASTY WITH STENT PLACEMENT  12/13/2001   PCI with stent to mid L circumflex, 95% stenosis to 0% residual  . CORONARY ANGIOPLASTY WITH STENT PLACEMENT  10/10/2003   PCI to mid AV circumflex; LAD 30% disease; RCA 100% occluded prox.  . CORONARY ANGIOPLASTY WITH STENT PLACEMENT  09/01/2011   PCI with stenting with bare metal stent to mid AV groove circumflex and PDA  . CORONARY ANGIOPLASTY WITH STENT PLACEMENT  10/17/2011   cutting balloon angioplasty of ostial lateral OM1 branch and bifurcation AV groove circumflex OM junction;  stenosis reduced to 0%  . ENDOBRONCHIAL ULTRASOUND Bilateral 08/25/2018   Procedure: ENDOBRONCHIAL ULTRASOUND;  Surgeon: IGarner Nash DO;  Location: [facility];  Service: Cardiopulmonary;  Laterality: Bilateral;  . ESOPHAGOGASTRODUODENOSCOPY (EGD) WITH PROPOFOL N/A 12/08/2018   Procedure: ESOPHAGOGASTRODUODENOSCOPY (EGD) WITH PROPOFOL;  Surgeon: JMilus Banister MD;  Location: [facility];  Service: Endoscopy;  Laterality: N/A;  . EXCISIONAL HEMORRHOIDECTOMY    . FINE NEEDLE ASPIRATION  08/25/2018   Procedure: FINE NEEDLE ASPIRATION;  Surgeon: IGarner Nash DO;  Location: [facility];  Service: Cardiopulmonary;;  . FLEXIBLE BRONCHOSCOPY  08/25/2018   Procedure: FLEXIBLE BRONCHOSCOPY;  Surgeon: IGarner Nash DO;  Location: [facility];  Service: Cardiopulmonary;;  . IR GASTROSTOMY TUBE MOD SED  12/11/2018  . IR GASTROSTOMY TUBE MOD SED  05/17/2019  . IR GASTROSTOMY TUBE REMOVAL  03/22/2019  . IR IMAGING GUIDED PORT INSERTION  09/29/2018  . LEFT HEART CATHETERIZATION WITH CORONARY ANGIOGRAM N/A 10/18/2011   Procedure: LEFT HEART CATHETERIZATION WITH CORONARY ANGIOGRAM;  Surgeon: DLeonie Man MD;  [facility];  Service: Cardiovascular;  Laterality: N/A;  .  LUMBAR LAMINECTOMY/DECOMPRESSION MICRODISCECTOMY  03/31/2012   Procedure: LUMBAR LAMINECTOMY/DECOMPRESSION MICRODISCECTOMY 1 LEVEL;  Surgeon: Charlie Pitter, MD;  Location: [facility] NEURO ORS;  Service: Neurosurgery;  Laterality: Left;  . TRANSTHORACIC ECHOCARDIOGRAM  07/28/2011   EF 55-65%; LVH, grade 1 diastolic dysfunction;         Home Medications    Prior to Admission medications   Medication Sig Start Date End Date Taking? Authorizing Provider  Accu-Chek FastClix Lancets MISC Use as directed to test blood sugar once daily. DX E11.9 05/23/19   [name], MD  acetaminophen (TYLENOL) 500 MG tablet Take 1,000 mg by mouth every 6 (six) hours as needed for moderate pain.    [provider]  Blood Glucose  Monitoring Suppl (ACCU-CHEK GUIDE) w/Device KIT 1 each by Does not apply route daily. Use as directed to test blood sugar once daily. DX E11.9 05/23/19   [name], MD  fentaNYL (DURAGESIC) 50 MCG/HR Place 1 patch onto the skin every 3 (three) days. 06/27/19   Curt Bears, MD  glucose blood (ACCU-CHEK GUIDE) test strip Use as directed to test blood sugar once daily. DX E11.9 05/23/19   [name], MD  lidocaine-prilocaine (EMLA) cream Apply 1 application topically as needed. Patient taking differently: Apply 1 application topically as needed (access).  06/05/19   [name], [name], PA-C  LORazepam (ATIVAN) 0.5 MG tablet 1 tab po q 4-6 hours prn or 1 tab po 30 minutes prior to radiation Patient taking differently: Take 0.5 mg by mouth every 6 (six) hours as needed for anxiety or sedation. 1 tab po q 4-6 hours prn or 1 tab po 30 minutes prior to radiation 08/30/18   Hayden Pedro, PA-C  megestrol (MEGACE ES) 625 MG/5ML suspension Take 5 mLs (625 mg total) by mouth daily. 06/20/19   Kyung Rudd, MD  megestrol (MEGACE) 20 MG tablet Take 1 tablet (20 mg total) by mouth daily. 06/20/19   [name], MD  mirtazapine (REMERON) 30 MG tablet Take 1 tablet (30 mg total) by mouth at bedtime. 04/26/19   [name], MD  nebivolol (BYSTOLIC) 5 MG tablet Take 1 tablet (5 mg total) by mouth daily. 03/19/19   Hilty, Nadean Corwin, MD  nitroGLYCERIN (NITROSTAT) 0.4 MG SL tablet Place 1 tablet (0.4 mg total) under the tongue every 5 (five) minutes as needed for chest pain. 03/19/19   Hilty, Nadean Corwin, MD  oxyCODONE-acetaminophen (PERCOCET/ROXICET) 5-325 MG tablet Take 1 tablet by mouth every 6 (six) hours as needed for severe pain. 06/27/19   Curt Bears, MD  pantoprazole (PROTONIX) 40 MG tablet Take 1 tablet (40 mg total) by mouth daily. 03/20/19   [name], MD  prochlorperazine (COMPAZINE) 10 MG tablet Take 1 tablet (10 mg total) by mouth every 6 (six) hours as needed for nausea or  vomiting. Patient not taking: Reported on 06/13/2019 06/05/19   [name], [name], PA-C  ranolazine (RANEXA) 1000 MG SR tablet Take 1 tablet (1,000 mg total) by mouth 2 (two) times daily. 05/24/17   Hilty, Nadean Corwin, MD  rosuvastatin (CRESTOR) 20 MG tablet Take 1 tablet (20 mg total) by mouth daily. 03/20/19   [name], MD  sucralfate (CARAFATE) 1 g tablet Take 1 tablet by mouth 4 (four) times daily -  before meals and at bedtime. 09/23/18   [provider]  tiotropium (SPIRIVA HANDIHALER) 18 MCG inhalation capsule Place 1 capsule (18 mcg total) into inhaler and inhale daily. 03/20/19   [name], MD  vitamin B-12 (CYANOCOBALAMIN)  500 MCG tablet Take 1 tablet (500 mcg total) by mouth daily. 06/30/18   Jamse Arn, MD  zolpidem (AMBIEN) 5 MG tablet Take 1 tablet (5 mg total) by mouth at bedtime. 03/20/19   [name], MD    Family History Family History  Problem Relation Age of Onset  . Heart attack Father   . Hypertension Mother   . Diabetes Mother   . Heart disease Brother        x 3   . Heart attack Brother        deceased  . Hypertension Sister   . Diabetes Sister   . Anesthesia problems Neg Hx   . Hypotension Neg Hx   . Malignant hyperthermia Neg Hx   . Pseudochol deficiency Neg Hx     Social History Social History   Tobacco Use  . Smoking status: Former Smoker    Packs/day: 0.25    Years: 25.00    Pack years: 6.25    Types: Cigarettes    Quit date: 05/24/2015    Years since quitting: 4.1  . Smokeless tobacco: Never Used  Substance Use Topics  . Alcohol use: No    Alcohol/week: 0.0 standard drinks  . Drug use: No     Allergies   Iohexol   Review of Systems Review of Systems  Constitutional: Positive for activity change. Negative for chills and fever.  Gastrointestinal: Negative for nausea and vomiting.  Musculoskeletal: Positive for back pain.  Neurological: Negative for numbness.     Physical Exam Updated Vital Signs BP  100/66   Pulse 96   Temp 98.5 F (36.9 C) (Oral)   Resp 17   Ht 5' 9" (1.753 m)   Wt 66.7 kg   SpO2 100%   BMI 21.71 kg/m   Physical Exam Vitals signs and nursing note reviewed.  Constitutional:      Appearance: He is well-developed.  HENT:     Head: Atraumatic.  Neck:     Musculoskeletal: Neck supple.  Cardiovascular:     Rate and Rhythm: Normal rate.  Pulmonary:     Effort: Pulmonary effort is normal.  Musculoskeletal:     Comments: Pt has tenderness over the lumbar and thoracic region. No step offs, no erythema. Pt has 1+ patellar reflex bilaterally. Able to discriminate between sharp and dull. Able to ambulate Neg straight leg raise exam   Skin:    General: Skin is warm.  Neurological:     Mental Status: He is alert and oriented to person, place, and time.      ED Treatments / Results  Labs (all labs ordered are listed, but only abnormal results are displayed) Labs Reviewed - No data to display  EKG None  Radiology Dg Thoracic Spine 2 View  Result Date: 07/02/2019 CLINICAL DATA:  Lung cancer patient, evaluate for lytic lesions EXAM: THORACIC SPINE 2 VIEWS COMPARISON:  CT chest dated 06/27/2019 FINDINGS: Normal thoracic kyphosis. No evidence of fracture or dislocation. Vertebral body heights are maintained. Mild degenerative changes of the midthoracic spine. No focal osseous lesions. Specifically, there are no lytic lesions, although this is better evaluated on recent CT. Visualized lungs are clear. The heart is normal in size. Left chest port terminates at the cavoatrial junction. IMPRESSION: No focal osseous lesions. Specifically, there are no lytic lesions, although this is better evaluated on recent CT chest. Electronically Signed   By: Julian Hy M.D.   On: 07/02/2019 03:53   Dg Lumbar Spine 2-3 Views  Result Date: 07/02/2019 CLINICAL DATA:  Lung cancer, back pain, evaluate for lytic lesions EXAM: LUMBAR SPINE - 2-3 VIEW COMPARISON:  CT  abdomen/pelvis dated 03/04/2019 FINDINGS: Five lumbar-type vertebral bodies. Normal lumbar doses. No evidence of fracture or dislocation. Vertebral body heights are maintained. Mild degenerative changes of the mid lumbar spine. Visualized bony pelvis appears intact. No focal osseous lesions.  Specifically, no lytic lesions are seen. Mild to moderate colonic stool burden. Gastrostomy tube overlying the left mid abdomen, reflecting the region of the stomach. IMPRESSION: No focal osseous lesions.  Specifically, no lytic lesions are seen. Electronically Signed   By: Julian Hy M.D.   On: 07/02/2019 03:54    Procedures Procedures (including critical care time)  Medications Ordered in ED Medications  morphine 4 MG/ML injection 4 mg (4 mg Intramuscular Given 07/02/19 0326)     Initial Impression / Assessment and Plan / ED Course  I have reviewed the triage vital signs and the nursing notes.  Pertinent labs & imaging results that were available during my care of the patient were reviewed by me and considered in my medical decision making (see chart for details).        53 year old comes in a chief complaint of back pain. His pain is radiating down his leg. He has known history of lung cancer with mets, although I reviewed his CT scans of the chest, abdomen and pelvis within the last year and a PET scan and does not seem like he has any spine mets.  He is supposed to follow-up with pain specialist soon.  He is undergoing chemo and radiation. On exam he has generalized tenderness over thoracic and lumbar spine.  He has no red flags suggesting of cord compression.  He informs me that this pain is similar to his chronic pain.   We will get x-ray just to screen, ensure there is no lytic lesions or pathologic fractures.  4:36 AM X-ray results were reassuring.  Patient was given IM pain meds here.  He is already on fentanyl patch and oxycodone.  Advised him to follow-up with his PCP.  Strict ER  return precautions have been discussed.  The patient appears reasonably screened and/or stabilized for discharge and I doubt any other medical condition or other [facility] requiring further screening, evaluation, or treatment in the ED at this time prior to discharge.   Results from the ER workup discussed with the patient face to face and all questions answered to the best of my ability. The patient is safe for discharge with strict return precautions.   Final Clinical Impressions(s) / ED Diagnoses   Final diagnoses:  Chronic midline low back pain without sciatica    ED Discharge Orders    None       Varney Biles, MD 07/02/19 [phone]

## 2019-07-02 NOTE — Progress Notes (Signed)
COMMUNITY PALLIATIVE CARE SW NOTE  PATIENT NAME: Andrew West DOB: 1966-10-11 MRN: [id number]  PRIMARY CARE PROVIDER: [name], MD  RESPONSIBLE PARTY:  Acct ID - Guarantor Home Phone Work Phone Relationship Acct Type  [id number] - [phone]  Self P/F     [address], [place], [place] [address]   Due to the COVID-19 crisis, this virtual check-in visit was done via telephone from my office and it was initiated and consent given by this patientand orfamily.   PLAN OF CARE and INTERVENTIONS:             1. GOALS OF CARE/ ADVANCE CARE PLANNING:  Patient wishes to be pain free.  He is a full code. 2. SOCIAL/EMOTIONAL/SPIRITUAL ASSESSMENT/ INTERVENTIONS:  SW conducted a Sales executive visit with patient per the request of Hanley Ben, NP.  SW provided active listening and supportive counseling.  Patient was taking a ride with his brother, which was providing him with some contentment.  He reports "hanging on".  Discussed patient seeing a counselor to assist with his mental health and he agreed.   3. PATIENT/CAREGIVER EDUCATION/ COPING:  Patient copes by expressing his feelings openly. 4. PERSONAL EMERGENCY PLAN:  He will contact EMS. 5. COMMUNITY RESOURCES COORDINATION/ HEALTH CARE NAVIGATION:  None. 6. FINANCIAL/LEGAL CONCERNS/INTERVENTIONS:  None.     SOCIAL HX:  Social History   Tobacco Use  . Smoking status: Former Smoker    Packs/day: 0.25    Years: 25.00    Pack years: 6.25    Types: Cigarettes    Quit date: 05/24/2015    Years since quitting: 4.1  . Smokeless tobacco: Never Used  Substance Use Topics  . Alcohol use: No    Alcohol/week: 0.0 standard drinks    CODE STATUS:  Full Code  ADVANCED DIRECTIVES: N MOST FORM COMPLETE:  N HOSPICE EDUCATION PROVIDED: Y PPS:  Patient's appetite is back to normal.  He ambulates independently. Duration of visit and documentation:  30 minutes.      Creola Corn Shanel Prazak, LCSW

## 2019-07-02 NOTE — ED Notes (Signed)
Patient transported to X-ray 

## 2019-07-02 NOTE — Discharge Instructions (Addendum)
Please follow-up with the pain specialist as planned.  You are informed us that the pain has been present for several weeks, however we did a screening test to ensure there is no tumor extension into the bones, and the x-rays not showing any evidence of tumor to the spine.  Return to the ER if you start developing new neurologic symptoms associated with this back pain such as significant weakness in the legs, new numbness or tingling around the genitalia or legs, difficulty urinating.

## 2019-07-03 ENCOUNTER — Inpatient Hospital Stay (HOSPITAL_BASED_OUTPATIENT_CLINIC_OR_DEPARTMENT_OTHER): Payer: Medicare Other | Admitting: Internal Medicine

## 2019-07-03 ENCOUNTER — Other Ambulatory Visit: Payer: Medicare Other

## 2019-07-03 ENCOUNTER — Other Ambulatory Visit: Payer: Self-pay | Admitting: Internal Medicine

## 2019-07-03 ENCOUNTER — Encounter: Payer: Self-pay | Admitting: Internal Medicine

## 2019-07-03 ENCOUNTER — Inpatient Hospital Stay: Payer: Medicare Other

## 2019-07-03 ENCOUNTER — Other Ambulatory Visit: Payer: Self-pay

## 2019-07-03 ENCOUNTER — Ambulatory Visit: Payer: Medicare Other | Admitting: Internal Medicine

## 2019-07-03 ENCOUNTER — Telehealth: Payer: Self-pay | Admitting: *Deleted

## 2019-07-03 ENCOUNTER — Other Ambulatory Visit: Payer: Self-pay | Admitting: *Deleted

## 2019-07-03 ENCOUNTER — Ambulatory Visit: Payer: Medicare Other

## 2019-07-03 VITALS — BP 111/73 | HR 96 | Temp 98.2°F | Resp 16 | Ht 69.0 in | Wt 162.4 lb

## 2019-07-03 DIAGNOSIS — J449 Chronic obstructive pulmonary disease, unspecified: Secondary | ICD-10-CM

## 2019-07-03 DIAGNOSIS — E119 Type 2 diabetes mellitus without complications: Secondary | ICD-10-CM | POA: Insufficient documentation

## 2019-07-03 DIAGNOSIS — T451X5A Adverse effect of antineoplastic and immunosuppressive drugs, initial encounter: Secondary | ICD-10-CM | POA: Diagnosis not present

## 2019-07-03 DIAGNOSIS — Z95828 Presence of other vascular implants and grafts: Secondary | ICD-10-CM

## 2019-07-03 DIAGNOSIS — Z5111 Encounter for antineoplastic chemotherapy: Secondary | ICD-10-CM | POA: Diagnosis not present

## 2019-07-03 DIAGNOSIS — C787 Secondary malignant neoplasm of liver and intrahepatic bile duct: Secondary | ICD-10-CM | POA: Insufficient documentation

## 2019-07-03 DIAGNOSIS — C7951 Secondary malignant neoplasm of bone: Secondary | ICD-10-CM | POA: Insufficient documentation

## 2019-07-03 DIAGNOSIS — I10 Essential (primary) hypertension: Secondary | ICD-10-CM | POA: Diagnosis not present

## 2019-07-03 DIAGNOSIS — F329 Major depressive disorder, single episode, unspecified: Secondary | ICD-10-CM | POA: Insufficient documentation

## 2019-07-03 DIAGNOSIS — F419 Anxiety disorder, unspecified: Secondary | ICD-10-CM | POA: Insufficient documentation

## 2019-07-03 DIAGNOSIS — E785 Hyperlipidemia, unspecified: Secondary | ICD-10-CM | POA: Diagnosis not present

## 2019-07-03 DIAGNOSIS — D6959 Other secondary thrombocytopenia: Secondary | ICD-10-CM

## 2019-07-03 DIAGNOSIS — I208 Other forms of angina pectoris: Secondary | ICD-10-CM | POA: Diagnosis not present

## 2019-07-03 DIAGNOSIS — J4489 Other specified chronic obstructive pulmonary disease: Secondary | ICD-10-CM

## 2019-07-03 DIAGNOSIS — C3401 Malignant neoplasm of right main bronchus: Secondary | ICD-10-CM | POA: Diagnosis not present

## 2019-07-03 DIAGNOSIS — E440 Moderate protein-calorie malnutrition: Secondary | ICD-10-CM | POA: Diagnosis not present

## 2019-07-03 DIAGNOSIS — Z431 Encounter for attention to gastrostomy: Secondary | ICD-10-CM | POA: Diagnosis not present

## 2019-07-03 DIAGNOSIS — D6481 Anemia due to antineoplastic chemotherapy: Secondary | ICD-10-CM | POA: Diagnosis not present

## 2019-07-03 DIAGNOSIS — Z923 Personal history of irradiation: Secondary | ICD-10-CM | POA: Insufficient documentation

## 2019-07-03 DIAGNOSIS — D649 Anemia, unspecified: Secondary | ICD-10-CM

## 2019-07-03 DIAGNOSIS — D61810 Antineoplastic chemotherapy induced pancytopenia: Secondary | ICD-10-CM

## 2019-07-03 DIAGNOSIS — R5081 Fever presenting with conditions classified elsewhere: Secondary | ICD-10-CM | POA: Diagnosis not present

## 2019-07-03 DIAGNOSIS — G4730 Sleep apnea, unspecified: Secondary | ICD-10-CM | POA: Diagnosis not present

## 2019-07-03 DIAGNOSIS — C3411 Malignant neoplasm of upper lobe, right bronchus or lung: Secondary | ICD-10-CM

## 2019-07-03 DIAGNOSIS — Z79899 Other long term (current) drug therapy: Secondary | ICD-10-CM | POA: Insufficient documentation

## 2019-07-03 DIAGNOSIS — A419 Sepsis, unspecified organism: Secondary | ICD-10-CM | POA: Diagnosis not present

## 2019-07-03 DIAGNOSIS — R5382 Chronic fatigue, unspecified: Secondary | ICD-10-CM

## 2019-07-03 DIAGNOSIS — D709 Neutropenia, unspecified: Secondary | ICD-10-CM | POA: Diagnosis not present

## 2019-07-03 LAB — CBC WITH DIFFERENTIAL (CANCER CENTER ONLY)
Abs Immature Granulocytes: 0.09 10*3/uL — ABNORMAL HIGH (ref 0.00–0.07)
Basophils Absolute: 0 10*3/uL (ref 0.0–0.1)
Basophils Relative: 0 %
Eosinophils Absolute: 0.1 10*3/uL (ref 0.0–0.5)
Eosinophils Relative: 1 %
HCT: 19.8 % — ABNORMAL LOW (ref 39.0–52.0)
Hemoglobin: 6.5 g/dL — CL (ref 13.0–17.0)
Immature Granulocytes: 1 %
Lymphocytes Relative: 3 %
Lymphs Abs: 0.3 10*3/uL — ABNORMAL LOW (ref 0.7–4.0)
MCH: 29.1 pg (ref 26.0–34.0)
MCHC: 32.8 g/dL (ref 30.0–36.0)
MCV: 88.8 fL (ref 80.0–100.0)
Monocytes Absolute: 0.8 10*3/uL (ref 0.1–1.0)
Monocytes Relative: 7 %
Neutro Abs: 10.4 10*3/uL — ABNORMAL HIGH (ref 1.7–7.7)
Neutrophils Relative %: 88 %
Platelet Count: 27 10*3/uL — ABNORMAL LOW (ref 150–400)
RBC: 2.23 MIL/uL — ABNORMAL LOW (ref 4.22–5.81)
RDW: 17.2 % — ABNORMAL HIGH (ref 11.5–15.5)
WBC Count: 11.7 10*3/uL — ABNORMAL HIGH (ref 4.0–10.5)
nRBC: 0 % (ref 0.0–0.2)

## 2019-07-03 LAB — CMP (CANCER CENTER ONLY)
ALT: 12 U/L (ref 0–44)
AST: 15 U/L (ref 15–41)
Albumin: 3.3 g/dL — ABNORMAL LOW (ref 3.5–5.0)
Alkaline Phosphatase: 71 U/L (ref 38–126)
Anion gap: 8 (ref 5–15)
BUN: 24 mg/dL — ABNORMAL HIGH (ref 6–20)
CO2: 22 mmol/L (ref 22–32)
Calcium: 8.4 mg/dL — ABNORMAL LOW (ref 8.9–10.3)
Chloride: 111 mmol/L (ref 98–111)
Creatinine: 1.73 mg/dL — ABNORMAL HIGH (ref 0.61–1.24)
GFR, Est AFR Am: 51 mL/min — ABNORMAL LOW (ref >60)
GFR, Estimated: 44 mL/min — ABNORMAL LOW (ref >60)
Glucose, Bld: 75 mg/dL (ref 70–99)
Potassium: 4 mmol/L (ref 3.5–5.1)
Sodium: 141 mmol/L (ref 135–145)
Total Bilirubin: 0.3 mg/dL (ref 0.3–1.2)
Total Protein: 6.7 g/dL (ref 6.5–8.1)

## 2019-07-03 LAB — MAGNESIUM: Magnesium: 1.9 mg/dL (ref 1.7–2.4)

## 2019-07-03 LAB — PREPARE RBC (CROSSMATCH)

## 2019-07-03 LAB — TSH: TSH: 2.36 u[IU]/mL (ref 0.320–4.118)

## 2019-07-03 LAB — SAMPLE TO BLOOD BANK

## 2019-07-03 MED ORDER — OXYCODONE-ACETAMINOPHEN 5-325 MG PO TABS: 1.0000 | ORAL_TABLET | Freq: Four times a day (QID) | ORAL | 0 refills | Status: DC | PRN

## 2019-07-03 MED ORDER — FENTANYL 50 MCG/HR TD PT72: 1.0000 | MEDICATED_PATCH | TRANSDERMAL | 0 refills | Status: DC

## 2019-07-03 MED ORDER — SODIUM CHLORIDE 0.9% FLUSH
10.0000 mL | INTRAVENOUS | Status: DC | PRN
  Administered 2019-07-03: 10 mL
  Filled 2019-07-03: qty 10

## 2019-07-03 MED ORDER — PROCHLORPERAZINE MALEATE 10 MG PO TABS: 10.0000 mg | ORAL_TABLET | Freq: Four times a day (QID) | ORAL | 1 refills | Status: AC | PRN

## 2019-07-03 MED ORDER — LORAZEPAM 0.5 MG PO TABS: ORAL_TABLET | ORAL | 0 refills | Status: AC

## 2019-07-03 MED FILL — OXYCODONE-ACETAMINOPHEN 5-3: 5-325 | 8 days supply | Qty: 30 | Fill #0

## 2019-07-03 MED FILL — LORazepam 0.5 MG TABS: 0.5 | 10 days supply | Qty: 20 | Fill #0

## 2019-07-03 NOTE — Progress Notes (Signed)
[facility] Telephone:([phone]   Fax:([phone]  OFFICE PROGRESS NOTE  [name], MD [place] [address]  DIAGNOSIS: DIAGNOSIS: Initially diagnosed as limited stage (T3, N3,M0)small cell lung cancer. He presented with right paratracheal mass in addition to right suprahilar mass and lymphadenopathy as well as right cervical lymph node diagnosed in October 2019. He showed evidence of metastatic disease to the spine, liver, as well as suspicious pleural based metastasis in June 2020.   PRIOR THERAPY:  1) Systemic chemotherapy with cisplatin 80 mg/M2 on day 1 and etoposide 100 mg/M2 on days 1, 2 and 3 every 3 weeks. Status post 4 cycles. This will be concurrent with radiotherapy with the start of cycle #2. Starting from cycle #4 his dose of cisplatin would be 60 mg/M2 and etoposide 90 mg/M2. Last dose was given November 23, 2018. 2) Systemic chemotherapy with carboplatin for an AUC of 5 on days 1 and etoposide 100 mg/m2 on days 1,2, and 3 and Imfinzi 1500 mg IV every 3 weeks with Neulasta support. First dose expected on 05/14/2019.   Status post 2 cycles.  This treatment was discontinued secondary to disease progression and intolerance with severe pancytopenia.  CURRENT THERAPY:  Hospice and palliative care.   INTERVAL HISTORY: Andrew West 53 y.o. male returns to the clinic today for follow-up visit.  His wife was available by phone during the visit.  The patient continues to complain of pain on the right side of the chest.  His pain management included fentanyl patch 50 mcg/hour every 3 days in addition to Percocet for breakthrough pain every 6 hours.  Unfortunately the patient has been applying 2 patches 50 mcg each the last week.  Still no good pain control.  He also has intermittent nausea and does not sleep well.  He was followed by the palliative care team at home but he also was seen by advance home care for management of his  nutrition.  He is requesting refill of Compazine and Percocet.  He has been complaining of increasing fatigue and weakness.  He denied having any current shortness of breath, cough or hemoptysis.  He has no diarrhea or constipation.  He denied abdominal pain.  He has significant pancytopenia with his chemotherapy.  He is here today for evaluation and discussion of his treatment plan after repeating CT scan of the chest recently.  He also had x-ray of the thoracolumbar spine yesterday that were unremarkable.   MEDICAL HISTORY: Past Medical History:  Diagnosis Date   Anxiety    CAD (coronary artery disease)    a. s/p multiple PCIs with last cath 11/2016 with severe multivessel CAD, s/p PCTA to LCx but unable to pass stent   Chronic leg pain    bilateral   Chronic lower back pain    COPD (chronic obstructive pulmonary disease) (HCC)    Depression    GERD (gastroesophageal reflux disease)    Takes Dexilant   HLD (hyperlipidemia)    Hypertension    met lung ca to bones dx'd 08/2018; 04/2019   mets to liver, spine abd pleural based mets   Rhabdomyolysis    h/o, r/t statins   Sleep apnea    "can't tolerate mask" (12/16/2016)   Type II diabetes mellitus (HCC)     ALLERGIES:  is allergic to iohexol.  MEDICATIONS:  Current Outpatient Medications  Medication Sig Dispense Refill   Accu-Chek FastClix Lancets MISC Use as directed to test  blood sugar once daily. DX E11.9 102 each 12   acetaminophen (TYLENOL) 500 MG tablet Take 1,000 mg by mouth every 6 (six) hours as needed for moderate pain.     Blood Glucose Monitoring Suppl (ACCU-CHEK GUIDE) w/Device KIT 1 each by Does not apply route daily. Use as directed to test blood sugar once daily. DX E11.9 1 kit 0   fentaNYL (DURAGESIC) 50 MCG/HR Place 1 patch onto the skin every 3 (three) days. 5 patch 0   glucose blood (ACCU-CHEK GUIDE) test strip Use as directed to test blood sugar once daily. DX E11.9 100 each 12    lidocaine-prilocaine (EMLA) cream Apply 1 application topically as needed. (Patient taking differently: Apply 1 application topically as needed (access). ) 30 g 0   LORazepam (ATIVAN) 0.5 MG tablet 1 tab po q 4-6 hours prn or 1 tab po 30 minutes prior to radiation (Patient taking differently: Take 0.5 mg by mouth every 6 (six) hours as needed for anxiety or sedation. 1 tab po q 4-6 hours prn or 1 tab po 30 minutes prior to radiation) 30 tablet 0   megestrol (MEGACE ES) 625 MG/5ML suspension Take 5 mLs (625 mg total) by mouth daily. 150 mL 1   megestrol (MEGACE) 20 MG tablet Take 1 tablet (20 mg total) by mouth daily. 30 tablet 1   mirtazapine (REMERON) 30 MG tablet Take 1 tablet (30 mg total) by mouth at bedtime. 30 tablet 3   nebivolol (BYSTOLIC) 5 MG tablet Take 1 tablet (5 mg total) by mouth daily. 90 tablet 3   nitroGLYCERIN (NITROSTAT) 0.4 MG SL tablet Place 1 tablet (0.4 mg total) under the tongue every 5 (five) minutes as needed for chest pain. 25 tablet 2   oxyCODONE-acetaminophen (PERCOCET/ROXICET) 5-325 MG tablet Take 1 tablet by mouth every 6 (six) hours as needed for severe pain. 30 tablet 0   pantoprazole (PROTONIX) 40 MG tablet Take 1 tablet (40 mg total) by mouth daily. 90 tablet 1   prochlorperazine (COMPAZINE) 10 MG tablet Take 1 tablet (10 mg total) by mouth every 6 (six) hours as needed for nausea or vomiting. (Patient not taking: Reported on 06/13/2019) 30 tablet 1   ranolazine (RANEXA) 1000 MG SR tablet Take 1 tablet (1,000 mg total) by mouth 2 (two) times daily. 180 tablet 3   rosuvastatin (CRESTOR) 20 MG tablet Take 1 tablet (20 mg total) by mouth daily. 90 tablet 1   sucralfate (CARAFATE) 1 g tablet Take 1 tablet by mouth 4 (four) times daily -  before meals and at bedtime.  0   tiotropium (SPIRIVA HANDIHALER) 18 MCG inhalation capsule Place 1 capsule (18 mcg total) into inhaler and inhale daily. 90 capsule 1   vitamin B-12 (CYANOCOBALAMIN) 500 MCG tablet Take 1  tablet (500 mcg total) by mouth daily. 30 tablet 1   zolpidem (AMBIEN) 5 MG tablet Take 1 tablet (5 mg total) by mouth at bedtime. 30 tablet 3   No current facility-administered medications for this visit.    Facility-Administered Medications Ordered in Other Visits  Medication Dose Route Frequency Provider Last Rate Last Dose   sodium chloride flush (NS) 0.9 % injection 10 mL  10 mL Intracatheter PRN Curt Bears, MD   10 mL at 07/03/19 0901    SURGICAL HISTORY:  Past Surgical History:  Procedure Laterality Date   BACK SURGERY     BRONCHIAL BIOPSY  08/25/2018   Procedure: BRONCHIAL BIOPSIES;  Surgeon: Garner Nash, DO;  Location: [facility];  Service: Cardiopulmonary;;   CARDIAC CATHETERIZATION N/A 09/25/2015   Procedure: Left Heart Cath and Coronary Angiography;  Surgeon: Leonie Man, MD;  Location: [facility];  Service: Cardiovascular;  Laterality: N/A;   CARDIAC CATHETERIZATION N/A 12/16/2016   Procedure: Left Heart Cath and Coronary Angiography;  Surgeon: Leonie Man, MD;  Location: [facility];  Service: Cardiovascular;  Laterality: N/A;   CARDIAC CATHETERIZATION N/A 12/16/2016   Procedure: Coronary Balloon Angioplasty;  Surgeon: Leonie Man, MD;  Location: [facility];  Service: Cardiovascular;  Laterality: N/A;   COLONOSCOPY W/ POLYPECTOMY     CORONARY ANGIOPLASTY  09/25/2015   mid cir & om   CORONARY ANGIOPLASTY WITH STENT PLACEMENT  10/09/2001   PTCA & stenting of mid AV circumflex; 2.5x53m Pixel stent   CORONARY ANGIOPLASTY WITH STENT PLACEMENT  12/13/2001   PCI with stent to mid L circumflex, 95% stenosis to 0% residual   CORONARY ANGIOPLASTY WITH STENT PLACEMENT  10/10/2003   PCI to mid AV circumflex; LAD 30% disease; RCA 100% occluded prox.   CORONARY ANGIOPLASTY WITH STENT PLACEMENT  09/01/2011   PCI with stenting with bare metal stent to mid AV groove circumflex and PDA   CORONARY ANGIOPLASTY WITH STENT PLACEMENT   10/17/2011   cutting balloon angioplasty of ostial lateral OM1 branch and bifurcation AV groove circumflex OM junction; stenosis reduced to 0%   ENDOBRONCHIAL ULTRASOUND Bilateral 08/25/2018   Procedure: ENDOBRONCHIAL ULTRASOUND;  Surgeon: IGarner Nash DO;  Location: [facility];  Service: Cardiopulmonary;  Laterality: Bilateral;   ESOPHAGOGASTRODUODENOSCOPY (EGD) WITH PROPOFOL N/A 12/08/2018   Procedure: ESOPHAGOGASTRODUODENOSCOPY (EGD) WITH PROPOFOL;  Surgeon: JMilus Banister MD;  Location: [facility];  Service: Endoscopy;  Laterality: N/A;   EXCISIONAL HEMORRHOIDECTOMY     FINE NEEDLE ASPIRATION  08/25/2018   Procedure: FINE NEEDLE ASPIRATION;  Surgeon: IGarner Nash DO;  Location: [facility];  Service: Cardiopulmonary;;   FLEXIBLE BRONCHOSCOPY  08/25/2018   Procedure: FLEXIBLE BRONCHOSCOPY;  Surgeon: IGarner Nash DO;  [facility];  Service: Cardiopulmonary;;   IR GASTROSTOMY TUBE MOD SED  12/11/2018   IR GASTROSTOMY TUBE MOD SED  05/17/2019   IR GASTROSTOMY TUBE REMOVAL  03/22/2019   IR IMAGING GUIDED PORT INSERTION  09/29/2018   LEFT HEART CATHETERIZATION WITH CORONARY ANGIOGRAM N/A 10/18/2011   Procedure: LEFT HEART CATHETERIZATION WITH CORONARY ANGIOGRAM;  Surgeon: DLeonie Man MD;  [facility];  Service: Cardiovascular;  Laterality: N/A;   LUMBAR LAMINECTOMY/DECOMPRESSION MICRODISCECTOMY  03/31/2012   Procedure: LUMBAR LAMINECTOMY/DECOMPRESSION MICRODISCECTOMY 1 LEVEL;  Surgeon: HCharlie Pitter MD;  [facility];  Service: Neurosurgery;  Laterality: Left;   TRANSTHORACIC ECHOCARDIOGRAM  07/28/2011   EF 55-65%; LVH, grade 1 diastolic dysfunction;     REVIEW OF SYSTEMS:  Constitutional: positive for fatigue and weight loss Eyes: negative Ears, nose, mouth, throat, and face: negative Respiratory: positive for dyspnea on exertion and pleurisy/chest pain Cardiovascular: negative Gastrointestinal: positive for  nausea Genitourinary:negative Integument/breast: negative Hematologic/lymphatic: negative Musculoskeletal:positive for back pain and bone pain Neurological: negative Behavioral/Psych: negative Endocrine: negative Allergic/Immunologic: negative   PHYSICAL EXAMINATION: General appearance: alert, cooperative, fatigued and no distress Head: Normocephalic, without obvious abnormality, atraumatic Neck: no adenopathy, no JVD, supple, symmetrical, trachea midline and thyroid not enlarged, symmetric, no tenderness/mass/nodules Lymph nodes: Cervical, supraclavicular, and axillary nodes normal. Resp: clear to auscultation bilaterally Back: symmetric, no curvature. ROM normal. No CVA tenderness. Cardio: regular rate and rhythm, S1, S2 normal, no murmur, click, rub  or gallop GI: soft, non-tender; bowel sounds normal; no masses,  no organomegaly Extremities: extremities normal, atraumatic, no cyanosis or edema Neurologic: Alert and oriented X 3, normal strength and tone. Normal symmetric reflexes. Normal coordination and gait  ECOG PERFORMANCE STATUS: 1 - Symptomatic but completely ambulatory  Blood pressure 111/73, pulse 96, temperature 98.2 F (36.8 C), temperature source Temporal, resp. rate 16, height _0  (1.753 m), weight 162 lb 6 oz (73.7 kg), SpO2 100 %.  LABORATORY DATA: Lab Results  Component Value Date   WBC 11.1 (H) 06/26/2019   HGB 7.0 (L) 06/26/2019   HCT 21.9 (L) 06/26/2019   MCV 89.8 06/26/2019   PLT 23 (LL) 06/26/2019      Chemistry      Component Value Date/Time   NA 141 06/26/2019 2305   NA 144 07/25/2018 0949   K 4.2 06/26/2019 2305   CL 108 06/26/2019 2305   CO2 25 06/26/2019 2305   BUN 25 (H) 06/26/2019 2305   BUN 10 07/25/2018 0949   CREATININE 1.76 (H) 06/26/2019 2305   CREATININE 1.18 06/19/2019 1451   CREATININE 0.96 12/13/2016 1151      Component Value Date/Time   CALCIUM 8.9 06/26/2019 2305   ALKPHOS 67 06/26/2019 2305   AST 14 (L) 06/26/2019 2305    AST 18 06/19/2019 1451   ALT 13 06/26/2019 2305   ALT 16 06/19/2019 1451   BILITOT 0.4 06/26/2019 2305   BILITOT 0.2 (L) 06/19/2019 1451       RADIOGRAPHIC STUDIES: Dg Thoracic Spine 2 View  Result Date: 07/02/2019 CLINICAL DATA:  Lung cancer patient, evaluate for lytic lesions EXAM: THORACIC SPINE 2 VIEWS COMPARISON:  CT chest dated 06/27/2019 FINDINGS: Normal thoracic kyphosis. No evidence of fracture or dislocation. Vertebral body heights are maintained. Mild degenerative changes of the midthoracic spine. No focal osseous lesions. Specifically, there are no lytic lesions, although this is better evaluated on recent CT. Visualized lungs are clear. The heart is normal in size. Left chest port terminates at the cavoatrial junction. IMPRESSION: No focal osseous lesions. Specifically, there are no lytic lesions, although this is better evaluated on recent CT chest. Electronically Signed   By: Julian Hy M.D.   On: 07/02/2019 03:53   Dg Lumbar Spine 2-3 Views  Result Date: 07/02/2019 CLINICAL DATA:  Lung cancer, back pain, evaluate for lytic lesions EXAM: LUMBAR SPINE - 2-3 VIEW COMPARISON:  CT abdomen/pelvis dated 03/04/2019 FINDINGS: Five lumbar-type vertebral bodies. Normal lumbar doses. No evidence of fracture or dislocation. Vertebral body heights are maintained. Mild degenerative changes of the mid lumbar spine. Visualized bony pelvis appears intact. No focal osseous lesions.  Specifically, no lytic lesions are seen. Mild to moderate colonic stool burden. Gastrostomy tube overlying the left mid abdomen, reflecting the region of the stomach. IMPRESSION: No focal osseous lesions.  Specifically, no lytic lesions are seen. Electronically Signed   By: Julian Hy M.D.   On: 07/02/2019 03:54   Ct Chest Wo Contrast  Result Date: 06/27/2019 CLINICAL DATA:  Small cell lung cancer metastatic to spine, liver and pleura. Radiation therapy completed January 2020. Ongoing chemotherapy.  Continued weight loss. Low back pain. EXAM: CT CHEST WITHOUT CONTRAST TECHNIQUE: Multidetector CT imaging of the chest was performed following the standard protocol without IV contrast. COMPARISON:  PET 05/03/2019 and CT chest 03/04/2019. FINDINGS: Cardiovascular: Left IJ Port-A-Cath terminates in the high right atrium. Atherosclerotic calcification of the aorta and coronary arteries. Heart size normal. No pericardial effusion. Mediastinum/Nodes: No pathologically enlarged  mediastinal or axillary lymph nodes. Hilar regions are difficult to definitively evaluate without IV contrast. Esophagus is grossly unremarkable. Lungs/Pleura: Mild ground-glass, bronchiectasis and architectural distortion in the medial aspect of the upper right hemithorax, indicative of radiation therapy. There are somewhat ill-defined areas of nodular ground-glass/consolidation in the anterior and posterior aspects of the right upper lobe as well as peripheral right lower lobe (series 7, images 40, 48, 86 and 94), which appear similar. Central right peribronchial thickening. Centrilobular and paraseptal emphysema. No pleural fluid. Airway is otherwise unremarkable. Upper Abdomen: There are several new low-attenuation lesions in the liver, when compared with 05/03/2019. Index nodule in the lateral dome measures 12 mm (series 2, image 114). Index left hepatic lobe lesion measures 1.4 cm (127). Visualized portions of the gallbladder, adrenal glands, kidneys, spleen, pancreas, stomach and bowel are otherwise unremarkable. 12 mm gastrohepatic ligament lymph node, stable. Musculoskeletal: Degenerative changes in the spine. No worrisome lytic or sclerotic lesions. IMPRESSION: 1. Evolving post radiation changes in the medial upper right hemithorax with scattered areas of ill-defined nodular ground-glass/consolidation in the right upper and right lower lobes, stable. Continued attention on follow-up exams is warranted. 2. Progressive hepatic metastatic  disease. 3. Aortic atherosclerosis (ICD10-170.0). Coronary artery calcification. 4.  Emphysema (ICD10-J43.9). Electronically Signed   By: Lorin Picket M.D.   On: 06/27/2019 08:36    ASSESSMENT AND PLAN: This is a very pleasant 53 year old African-American male who was initially diagnosed with limited stage small cell lung cancer.  He presented with a right paratracheal and a right suprahilar mass with right cervical lymphadenopathy.  He was diagnosed in October 2019.  He previously went systemic chemotherapy in addition to radiation.  He was admitted to the hospital for almost every cycle of chemotherapy with significant pancytopenia. His last dose was completed on 11/23/2018. He has been on observation since this time. He also experienced dysphasia and odynophagia which required a PEG tube for nutrition.  His PEG tube was removed on 03/22/2019.   This was placed again after his disease recurrence. The patient had evidence for disease progression and he started treatment again with carboplatin, etoposide and Imfinzi status post 2 cycles. The patient has a rough time with this treatment with prolonged and persistent pancytopenia. He had repeat CT scan of the chest performed recently.  I personally and independently reviewed the scans and discussed the result with the patient and his wife today. Unfortunately his scan showed significant disease progression especially in the liver. I had a lengthy discussion with the patient about his current condition and treatment options.  Unfortunately the patient is not responding well to treatment and he has prolonged and persistent pancytopenia with every cycle of the treatment. I strongly encouraged the patient to consider palliative care and hospice at this point.  The patient agreed but he also does not like to to give up and stop all treatment.  He is interested in some treatment in the future. He has an appointment at [facility] for a second opinion  next week. I will also arrange for the patient to see Dr. Durenda Hurt at [facility] for another opinion about his condition and any other treatment options. For the time being, the patient will be on hospice service until improvement of his condition. For pain management he will continue on the fentanyl patch 50 mcg/hour every 3 days in addition to Percocet for breakthrough pain.  We will ask the hospice service to manage his pain from now on.  We  have a concern about the way he is taking his pain medication at home and the multiple people interfering with his care and pain management.  I will give him a refill of his pain medication today. For the chemotherapy-induced anemia, we will arrange for the patient to receive 2 units of PRBCs transfusion.  He was supposed to have it done today but he declined and would like to wait until tomorrow. I will arrange his follow-up visit on as-needed basis at this point. He was advised to call immediately if he has any other concerning symptoms in the interval. The patient voices understanding of current disease status and treatment options and is in agreement with the current care plan.  All questions were answered. The patient knows to call the clinic with any problems, questions or concerns. We can certainly see the patient much sooner if necessary.  I spent 15 minutes counseling the patient face to face. The total time spent in the appointment was 25 minutes.  Disclaimer: This note was dictated with voice recognition software. Similar sounding words can inadvertently be transcribed and may not be corrected upon review.

## 2019-07-03 NOTE — Telephone Encounter (Signed)
Received call from pt's wife, requesting medication for pt's 'nerves'.  Pt has had Lorazepam in the past-no refills since 08/30/18  Please advise

## 2019-07-03 NOTE — Telephone Encounter (Signed)
[facility] with Hospice referral. Pt is currently with Palliative Care.  Dr. Julien Nordmann will be the attending but he does want Hospice MD to manage pt's pain control issues.

## 2019-07-03 NOTE — Progress Notes (Signed)
Nutrition Follow-up:  Patient with lung cancer.  No treatment at this time. MD notes reviewed and noted orders placed for hospice services already with palliative services.  Patient planning second opinion at [facility] and [facility].   Noted follow-up as needed with medical oncology.    Spoke with patient via phone.  Patient reports that "everything is going well" regarding nutrition.  Patient reports that he is eating orally and taking some of the osmolite 1.5 tube feeding.  Has Two Call left at home if needs to use it.  Asking for more split gauze for feeding tube.      Medications: reviewed  Labs: reviewed  Anthropometrics:   Weight noted 162 lb 6 oz today increased from 147 lb on 8/9. ?? accuracy   Estimated Energy Needs  Kcals: [phone] calories Protein: 106-124 g Fluid: 2.4 L  NUTRITION DIAGNOSIS: Inadequate oral intake improving with weight increase  INTERVENTION:  With patient planning hospice services and to be seen on as needed basis patient will reach out to RD if needed.   RD will alert [place] RD to hospice status with cancer center and needing more tube feeding supplies.  [place] RD will follow at distance and available if needed. Discussed with patient if wants to change tube feeding to osmolite 1.5 vs Two Cal that new orders will need to be signed and sent to [facility].  Patient will contact RD if needs more osmolite.     NEXT VISIT: patient to reach out to RD as needed  Charese Abundis B. Zenia Resides, [place], [place] Registered Dietitian [phone] (pager)

## 2019-07-04 ENCOUNTER — Other Ambulatory Visit: Payer: Self-pay

## 2019-07-04 ENCOUNTER — Ambulatory Visit: Payer: Medicare Other

## 2019-07-04 ENCOUNTER — Telehealth: Payer: Self-pay | Admitting: Internal Medicine

## 2019-07-04 ENCOUNTER — Inpatient Hospital Stay: Payer: Medicare Other

## 2019-07-04 DIAGNOSIS — C7951 Secondary malignant neoplasm of bone: Secondary | ICD-10-CM | POA: Diagnosis not present

## 2019-07-04 DIAGNOSIS — C3411 Malignant neoplasm of upper lobe, right bronchus or lung: Secondary | ICD-10-CM | POA: Diagnosis not present

## 2019-07-04 DIAGNOSIS — D61810 Antineoplastic chemotherapy induced pancytopenia: Secondary | ICD-10-CM

## 2019-07-04 DIAGNOSIS — C787 Secondary malignant neoplasm of liver and intrahepatic bile duct: Secondary | ICD-10-CM | POA: Diagnosis not present

## 2019-07-04 DIAGNOSIS — E119 Type 2 diabetes mellitus without complications: Secondary | ICD-10-CM | POA: Diagnosis not present

## 2019-07-04 DIAGNOSIS — T451X5A Adverse effect of antineoplastic and immunosuppressive drugs, initial encounter: Secondary | ICD-10-CM | POA: Diagnosis not present

## 2019-07-04 DIAGNOSIS — D6481 Anemia due to antineoplastic chemotherapy: Secondary | ICD-10-CM | POA: Diagnosis not present

## 2019-07-04 LAB — PREPARE RBC (CROSSMATCH)

## 2019-07-04 MED ORDER — ACETAMINOPHEN 325 MG PO TABS
650.0000 mg | ORAL_TABLET | Freq: Once | ORAL | Status: AC
  Administered 2019-07-04: 650 mg via ORAL

## 2019-07-04 MED ORDER — ACETAMINOPHEN 325 MG PO TABS
ORAL_TABLET | ORAL | Status: AC
  Filled 2019-07-04: qty 2

## 2019-07-04 MED ORDER — SODIUM CHLORIDE 0.9% IV SOLUTION
250.0000 mL | Freq: Once | INTRAVENOUS | Status: AC
  Administered 2019-07-04: 250 mL via INTRAVENOUS
  Filled 2019-07-04: qty 250

## 2019-07-04 MED ORDER — SODIUM CHLORIDE 0.9% FLUSH
10.0000 mL | INTRAVENOUS | Status: AC | PRN
  Administered 2019-07-04: 10 mL
  Filled 2019-07-04: qty 10

## 2019-07-04 MED ORDER — DIPHENHYDRAMINE HCL 25 MG PO CAPS
ORAL_CAPSULE | ORAL | Status: AC
  Filled 2019-07-04: qty 1

## 2019-07-04 MED ORDER — HEPARIN SOD (PORK) LOCK FLUSH 100 UNIT/ML IV SOLN
500.0000 [IU] | Freq: Every day | INTRAVENOUS | Status: AC | PRN
  Administered 2019-07-04: 500 [IU]
  Filled 2019-07-04: qty 5

## 2019-07-04 MED ORDER — DIPHENHYDRAMINE HCL 25 MG PO CAPS
25.0000 mg | ORAL_CAPSULE | Freq: Once | ORAL | Status: AC
  Administered 2019-07-04: 25 mg via ORAL

## 2019-07-04 NOTE — Telephone Encounter (Signed)
Spoke with patient and have scheduled an In-Person Palliative f/u visit for 07/06/19 @ 11 AM.

## 2019-07-04 NOTE — Progress Notes (Signed)
Patient received 1 unit of packed red blood cells today. Second unit to be completed later this week.

## 2019-07-05 ENCOUNTER — Ambulatory Visit: Payer: Medicare Other

## 2019-07-05 ENCOUNTER — Telehealth: Payer: Self-pay | Admitting: *Deleted

## 2019-07-05 ENCOUNTER — Other Ambulatory Visit: Payer: Self-pay | Admitting: *Deleted

## 2019-07-05 DIAGNOSIS — C3490 Malignant neoplasm of unspecified part of unspecified bronchus or lung: Secondary | ICD-10-CM | POA: Diagnosis not present

## 2019-07-05 DIAGNOSIS — M545 Low back pain: Secondary | ICD-10-CM | POA: Diagnosis not present

## 2019-07-05 DIAGNOSIS — D61810 Antineoplastic chemotherapy induced pancytopenia: Secondary | ICD-10-CM

## 2019-07-05 DIAGNOSIS — C3411 Malignant neoplasm of upper lobe, right bronchus or lung: Secondary | ICD-10-CM | POA: Diagnosis not present

## 2019-07-05 DIAGNOSIS — D61818 Other pancytopenia: Secondary | ICD-10-CM | POA: Diagnosis not present

## 2019-07-05 DIAGNOSIS — T451X5A Adverse effect of antineoplastic and immunosuppressive drugs, initial encounter: Secondary | ICD-10-CM

## 2019-07-05 NOTE — Telephone Encounter (Signed)
Received vm message from pt's wife [name].  She is requesting call back.  Attempted call and no answer, unable to leave vm message.  TCT [place] as he had not shown up for his blood transfusion @ 2pm.  Spoke with patient. He states he is on his way. He stated that he had an appt at [facility]. He states the appt went well. He states he would tell this nurse about it when he got here.

## 2019-07-05 NOTE — Telephone Encounter (Signed)
Pt has not shown up for his blood transfusion by  3:30 pm.  TCT patient. No answer. VM message left for him letting him know that it is now too late in the day for his transfusion. He was to have been here by 2:00pm.  Informed him that we can do his 2nd unit of blood at 12:30 pm tomorrow. Asked pt to call back to confirm this appt.

## 2019-07-06 ENCOUNTER — Telehealth: Payer: Self-pay

## 2019-07-06 ENCOUNTER — Other Ambulatory Visit: Payer: Medicare Other | Admitting: Internal Medicine

## 2019-07-06 ENCOUNTER — Telehealth: Payer: Self-pay | Admitting: *Deleted

## 2019-07-06 ENCOUNTER — Other Ambulatory Visit: Payer: Self-pay

## 2019-07-06 ENCOUNTER — Ambulatory Visit: Payer: Medicare Other

## 2019-07-06 NOTE — Progress Notes (Signed)
Radiation Oncology         [phone] ________________________________  Name: Andrew West MRN: [id number]  Date: 05/10/2019  DOB: 08-Sep-1966  SIMULATION AND TREATMENT PLANNING NOTE  DIAGNOSIS:     ICD-10-CM   1. Bone metastases (HCC)  C79.51      Site:  L4  NARRATIVE:  The patient was brought to the CT Simulation planning suite.  Identity was confirmed.  All relevant records and images related to the planned course of therapy were reviewed.   Written consent to proceed with treatment was confirmed which was freely given after reviewing the details related to the planned course of therapy had been reviewed with the patient.  Then, the patient was set-up in a stable reproducible  supine position for radiation therapy.  CT images were obtained.  Surface markings were placed.    Medically necessary complex treatment device(s) for immobilization:  Vac-lock bag.   The CT images were loaded into the planning software.  Then the target and avoidance structures were contoured.  Treatment planning then occurred.  The radiation prescription was entered and confirmed.  A total of 4 complex treatment devices were fabricated which relate to the designed radiation treatment fields. Each of these customized fields/ complex treatment devices will be used on a daily basis during the radiation course. I have requested : 3D Simulation  I have requested a DVH of the following structures: target volume, left kidney, right kidney.   PLAN:  The patient will receive 37.5 Gy in 15 fractions.  ________________________________   Radene Gunning, MD, PhD

## 2019-07-06 NOTE — Telephone Encounter (Signed)
Attempted to call [facility] as he has not shown up for his blood transfusion again today. No answer and his voice mailbox id=s full. Unable to leave message.  TCT his wife. The available # does not go through. Attempted x3  TCT his mother-listed as his emergency contact.  Spoke with her. She states she will try to find him and have him call us.  She did not know he needed a blood transfusion

## 2019-07-07 LAB — BPAM RBC
Blood Product Expiration Date: 202008292359
Blood Product Expiration Date: 202008302359
ISSUE DATE / TIME: 202008121052
Unit Type and Rh: 6200
Unit Type and Rh: 6200

## 2019-07-07 LAB — TYPE AND SCREEN
ABO/RH(D): A POS
Antibody Screen: NEGATIVE
Unit division: 0
Unit division: 0

## 2019-07-09 ENCOUNTER — Telehealth: Payer: Self-pay | Admitting: *Deleted

## 2019-07-09 ENCOUNTER — Other Ambulatory Visit: Payer: Self-pay

## 2019-07-09 ENCOUNTER — Inpatient Hospital Stay: Payer: Medicare Other

## 2019-07-09 ENCOUNTER — Other Ambulatory Visit: Payer: Self-pay | Admitting: *Deleted

## 2019-07-09 ENCOUNTER — Other Ambulatory Visit: Payer: Self-pay | Admitting: Emergency Medicine

## 2019-07-09 DIAGNOSIS — C3411 Malignant neoplasm of upper lobe, right bronchus or lung: Secondary | ICD-10-CM | POA: Diagnosis not present

## 2019-07-09 DIAGNOSIS — D61810 Antineoplastic chemotherapy induced pancytopenia: Secondary | ICD-10-CM

## 2019-07-09 DIAGNOSIS — E119 Type 2 diabetes mellitus without complications: Secondary | ICD-10-CM | POA: Diagnosis not present

## 2019-07-09 DIAGNOSIS — C787 Secondary malignant neoplasm of liver and intrahepatic bile duct: Secondary | ICD-10-CM | POA: Diagnosis not present

## 2019-07-09 DIAGNOSIS — Z95828 Presence of other vascular implants and grafts: Secondary | ICD-10-CM

## 2019-07-09 DIAGNOSIS — T451X5A Adverse effect of antineoplastic and immunosuppressive drugs, initial encounter: Secondary | ICD-10-CM | POA: Diagnosis not present

## 2019-07-09 DIAGNOSIS — D649 Anemia, unspecified: Secondary | ICD-10-CM

## 2019-07-09 DIAGNOSIS — D6481 Anemia due to antineoplastic chemotherapy: Secondary | ICD-10-CM | POA: Diagnosis not present

## 2019-07-09 DIAGNOSIS — C7951 Secondary malignant neoplasm of bone: Secondary | ICD-10-CM | POA: Diagnosis not present

## 2019-07-09 LAB — CMP (CANCER CENTER ONLY)
ALT: 59 U/L — ABNORMAL HIGH (ref 0–44)
AST: 17 U/L (ref 15–41)
Albumin: 2.9 g/dL — ABNORMAL LOW (ref 3.5–5.0)
Alkaline Phosphatase: 92 U/L (ref 38–126)
Anion gap: 8 (ref 5–15)
BUN: 19 mg/dL (ref 6–20)
CO2: 21 mmol/L — ABNORMAL LOW (ref 22–32)
Calcium: 8 mg/dL — ABNORMAL LOW (ref 8.9–10.3)
Chloride: 111 mmol/L (ref 98–111)
Creatinine: 1.47 mg/dL — ABNORMAL HIGH (ref 0.61–1.24)
GFR, Est AFR Am: >60 mL/min (ref >60)
GFR, Estimated: 54 mL/min — ABNORMAL LOW (ref >60)
Glucose, Bld: 82 mg/dL (ref 70–99)
Potassium: 4 mmol/L (ref 3.5–5.1)
Sodium: 140 mmol/L (ref 135–145)
Total Bilirubin: 0.3 mg/dL (ref 0.3–1.2)
Total Protein: 6 g/dL — ABNORMAL LOW (ref 6.5–8.1)

## 2019-07-09 LAB — CBC WITH DIFFERENTIAL (CANCER CENTER ONLY)
Abs Immature Granulocytes: 0.04 10*3/uL (ref 0.00–0.07)
Basophils Absolute: 0 10*3/uL (ref 0.0–0.1)
Basophils Relative: 0 %
Eosinophils Absolute: 0.1 10*3/uL (ref 0.0–0.5)
Eosinophils Relative: 1 %
HCT: 19.6 % — ABNORMAL LOW (ref 39.0–52.0)
Hemoglobin: 6.5 g/dL — CL (ref 13.0–17.0)
Immature Granulocytes: 1 %
Lymphocytes Relative: 4 %
Lymphs Abs: 0.3 10*3/uL — ABNORMAL LOW (ref 0.7–4.0)
MCH: 29.7 pg (ref 26.0–34.0)
MCHC: 33.2 g/dL (ref 30.0–36.0)
MCV: 89.5 fL (ref 80.0–100.0)
Monocytes Absolute: 0.9 10*3/uL (ref 0.1–1.0)
Monocytes Relative: 13 %
Neutro Abs: 6 10*3/uL (ref 1.7–7.7)
Neutrophils Relative %: 81 %
Platelet Count: 37 10*3/uL — ABNORMAL LOW (ref 150–400)
RBC: 2.19 MIL/uL — ABNORMAL LOW (ref 4.22–5.81)
RDW: 18.8 % — ABNORMAL HIGH (ref 11.5–15.5)
WBC Count: 7.3 10*3/uL (ref 4.0–10.5)
nRBC: 0 % (ref 0.0–0.2)

## 2019-07-09 LAB — MAGNESIUM: Magnesium: 1.7 mg/dL (ref 1.7–2.4)

## 2019-07-09 LAB — PREPARE RBC (CROSSMATCH)

## 2019-07-09 MED ORDER — SODIUM CHLORIDE 0.9% FLUSH
10.0000 mL | INTRAVENOUS | Status: AC | PRN
  Administered 2019-07-09: 10 mL
  Filled 2019-07-09: qty 10

## 2019-07-09 MED ORDER — HEPARIN SOD (PORK) LOCK FLUSH 100 UNIT/ML IV SOLN
500.0000 [IU] | Freq: Every day | INTRAVENOUS | Status: DC | PRN
  Filled 2019-07-09: qty 5

## 2019-07-09 MED ORDER — SODIUM CHLORIDE 0.9% IV SOLUTION
250.0000 mL | Freq: Once | INTRAVENOUS | Status: AC
  Administered 2019-07-09: 250 mL via INTRAVENOUS
  Filled 2019-07-09: qty 250

## 2019-07-09 MED ORDER — ACETAMINOPHEN 325 MG PO TABS
650.0000 mg | ORAL_TABLET | Freq: Once | ORAL | Status: AC
  Administered 2019-07-09: 13:00:00 650 mg via ORAL

## 2019-07-09 MED ORDER — SODIUM CHLORIDE 0.9% FLUSH
10.0000 mL | INTRAVENOUS | Status: DC | PRN
  Administered 2019-07-09: 13:00:00 10 mL
  Filled 2019-07-09: qty 10

## 2019-07-09 MED ORDER — DIPHENHYDRAMINE HCL 25 MG PO CAPS
ORAL_CAPSULE | ORAL | Status: AC
  Filled 2019-07-09: qty 1

## 2019-07-09 MED ORDER — ACETAMINOPHEN 325 MG PO TABS
ORAL_TABLET | ORAL | Status: AC
  Filled 2019-07-09: qty 2

## 2019-07-09 MED ORDER — DIPHENHYDRAMINE HCL 25 MG PO CAPS
25.0000 mg | ORAL_CAPSULE | Freq: Once | ORAL | Status: AC
  Administered 2019-07-09: 25 mg via ORAL

## 2019-07-09 MED FILL — PANTOPRAZOLE SOD DR 40 MG T: 40 | 90 days supply | Qty: 90 | Fill #1

## 2019-07-09 NOTE — Patient Instructions (Signed)

## 2019-07-09 NOTE — Telephone Encounter (Signed)
I was out last week but from the emails received this patient was evaluated for hospice but declined at this time due to wanting to continue chemo.  Will continue to remain a palliative patient at this time. Thank you Prestyn Mahn

## 2019-07-09 NOTE — Telephone Encounter (Signed)
Received call from patient this am.  He is requesting to get his 2nd unit of blood today.Andrew West He did not show for this on Thursday and Friday of last week.  Ok per Dr. Julien Nordmann. Pt will need repeat type and cross.  Pt states he can be here by 12 noon.  High priority scheduling message sent. Transfusion orders placed

## 2019-07-09 NOTE — Progress Notes (Signed)
CRITICAL VALUE ALERT  Critical Value:  Hgb 6.5  Date & Time Notied:  07/09/2019 @ 12:56pm  Provider Notified: Notified Ruben Im, RN who is treating patient today with 1 unit of blood.  She will discuss with Dr. Julien Nordmann if further orders need to be added.  Orders Received/Actions taken: Pending discussion

## 2019-07-10 ENCOUNTER — Inpatient Hospital Stay: Payer: Medicare Other

## 2019-07-10 ENCOUNTER — Other Ambulatory Visit: Payer: Self-pay

## 2019-07-10 DIAGNOSIS — E119 Type 2 diabetes mellitus without complications: Secondary | ICD-10-CM | POA: Diagnosis not present

## 2019-07-10 DIAGNOSIS — D649 Anemia, unspecified: Secondary | ICD-10-CM

## 2019-07-10 DIAGNOSIS — C7951 Secondary malignant neoplasm of bone: Secondary | ICD-10-CM | POA: Diagnosis not present

## 2019-07-10 DIAGNOSIS — D6481 Anemia due to antineoplastic chemotherapy: Secondary | ICD-10-CM | POA: Diagnosis not present

## 2019-07-10 DIAGNOSIS — C787 Secondary malignant neoplasm of liver and intrahepatic bile duct: Secondary | ICD-10-CM | POA: Diagnosis not present

## 2019-07-10 DIAGNOSIS — C3411 Malignant neoplasm of upper lobe, right bronchus or lung: Secondary | ICD-10-CM | POA: Diagnosis not present

## 2019-07-10 DIAGNOSIS — T451X5A Adverse effect of antineoplastic and immunosuppressive drugs, initial encounter: Secondary | ICD-10-CM | POA: Diagnosis not present

## 2019-07-10 MED ORDER — SODIUM CHLORIDE 0.9% FLUSH
10.0000 mL | INTRAVENOUS | Status: AC | PRN
  Administered 2019-07-10: 10 mL
  Filled 2019-07-10: qty 10

## 2019-07-10 MED ORDER — HEPARIN SOD (PORK) LOCK FLUSH 100 UNIT/ML IV SOLN
500.0000 [IU] | Freq: Every day | INTRAVENOUS | Status: AC | PRN
  Administered 2019-07-10: 500 [IU]
  Filled 2019-07-10: qty 5

## 2019-07-10 MED ORDER — SODIUM CHLORIDE 0.9% IV SOLUTION
250.0000 mL | Freq: Once | INTRAVENOUS | Status: AC
  Administered 2019-07-10: 250 mL via INTRAVENOUS
  Filled 2019-07-10: qty 250

## 2019-07-10 MED ORDER — DIPHENHYDRAMINE HCL 25 MG PO CAPS
25.0000 mg | ORAL_CAPSULE | Freq: Once | ORAL | Status: AC
  Administered 2019-07-10: 25 mg via ORAL

## 2019-07-10 MED ORDER — DIPHENHYDRAMINE HCL 25 MG PO CAPS
ORAL_CAPSULE | ORAL | Status: AC
  Filled 2019-07-10: qty 1

## 2019-07-10 MED ORDER — ACETAMINOPHEN 325 MG PO TABS
650.0000 mg | ORAL_TABLET | Freq: Once | ORAL | Status: AC
  Administered 2019-07-10: 650 mg via ORAL

## 2019-07-10 MED ORDER — ACETAMINOPHEN 325 MG PO TABS
ORAL_TABLET | ORAL | Status: AC
  Filled 2019-07-10: qty 2

## 2019-07-10 NOTE — Patient Instructions (Signed)

## 2019-07-11 DIAGNOSIS — C3411 Malignant neoplasm of upper lobe, right bronchus or lung: Secondary | ICD-10-CM | POA: Diagnosis not present

## 2019-07-11 DIAGNOSIS — A419 Sepsis, unspecified organism: Secondary | ICD-10-CM | POA: Diagnosis not present

## 2019-07-11 DIAGNOSIS — R5081 Fever presenting with conditions classified elsewhere: Secondary | ICD-10-CM | POA: Diagnosis not present

## 2019-07-11 DIAGNOSIS — D709 Neutropenia, unspecified: Secondary | ICD-10-CM | POA: Diagnosis not present

## 2019-07-11 DIAGNOSIS — C3401 Malignant neoplasm of right main bronchus: Secondary | ICD-10-CM | POA: Diagnosis not present

## 2019-07-11 DIAGNOSIS — Z431 Encounter for attention to gastrostomy: Secondary | ICD-10-CM | POA: Diagnosis not present

## 2019-07-11 LAB — BPAM RBC
Blood Product Expiration Date: 202008312359
Blood Product Expiration Date: 202009122359
ISSUE DATE / TIME: 202008171358
ISSUE DATE / TIME: 202008180942
Unit Type and Rh: 6200
Unit Type and Rh: 6200

## 2019-07-11 LAB — TYPE AND SCREEN
ABO/RH(D): A POS
Antibody Screen: NEGATIVE
Unit division: 0
Unit division: 0

## 2019-07-12 ENCOUNTER — Other Ambulatory Visit: Payer: Medicare Other | Admitting: Adult Health Nurse Practitioner

## 2019-07-12 DIAGNOSIS — Z515 Encounter for palliative care: Secondary | ICD-10-CM

## 2019-07-12 NOTE — Progress Notes (Signed)
Therapist, nutritional Palliative Care Consult Note Telephone: [phone]  Fax: [phone]  PATIENT NAME: Andrew West DOB: 07-25-1966 MRN: [id number]  PRIMARY CARE PROVIDER:   [name], MD  REFERRING PROVIDER: Dr. Si Gaul  RESPONSIBLE PARTY:   Self [phone]     RECOMMENDATIONS and PLAN:  Had appointment at 4pm.  Patient not at home.  Family member at the house tried calling him and was not able to reach him.  Patient called me and left a message.  I attempted to call back and was unable to leave a message as his VM box was full.  Will attempt to call back tomorrow to reschedule.   I spent 0 minutes providing this consultation. More than 50% of the time in this consultation was spent coordinating communication.   HISTORY OF PRESENT ILLNESS:  Andrew West is a 53 y.o. year old male with multiple medical problems including small cell lung cancer of RUL, CAD, DMT2, HTN, OSA, COPD. Palliative Care was asked to help address goals of care.   CODE STATUS: Full Code  PPS: 60% HOSPICE ELIGIBILITY/DIAGNOSIS: TBD  PAST MEDICAL HISTORY:  Past Medical History:  Diagnosis Date  . Anxiety   . CAD (coronary artery disease)    a. s/p multiple PCIs with last cath 11/2016 with severe multivessel CAD, s/p PCTA to LCx but unable to pass stent  . Chronic leg pain    bilateral  . Chronic lower back pain   . COPD (chronic obstructive pulmonary disease) (HCC)   . Depression   . GERD (gastroesophageal reflux disease)    Takes Dexilant  . HLD (hyperlipidemia)   . Hypertension   . 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 Rhabdomyolysis    h/o, r/t statins  . Sleep apnea    "can't tolerate mask" (12/16/2016)  . Type II diabetes mellitus (HCC)     SOCIAL HX:  Social History   Tobacco Use  . Smoking status: Former Smoker    Packs/day: 0.25    Years: 25.00    Pack years: 6.25    Types:  Cigarettes    Quit date: 05/24/2015    Years since quitting: 4.1  . Smokeless tobacco: Never Used  Substance Use Topics  . Alcohol use: No    Alcohol/week: 0.0 standard drinks    ALLERGIES:  Allergies  Allergen Reactions  . Iohexol Anaphylaxis    PT. TO BE PREMEDICATED PRIOR TO IV CONTRAST PER DR Eppie Gibson /MMS//12/15/15Desc: PT BECAME SOB AND CHEST TIGHTNESS AFTER CONTRAST INJECTION.  Ardis Hughs., Onset Date: [id number]      PERTINENT MEDICATIONS:  Outpatient Encounter Medications as of 07/12/2019  Medication Sig  . Accu-Chek FastClix Lancets MISC Use as directed to test blood sugar once daily. DX E11.9  . acetaminophen (TYLENOL) 500 MG tablet Take 1,000 mg by mouth every 6 (six) hours as needed for moderate pain.  . Blood Glucose Monitoring Suppl (ACCU-CHEK GUIDE) w/Device KIT 1 each by Does not apply route daily. Use as directed to test blood sugar once daily. DX E11.9  . fentaNYL (DURAGESIC) 50 MCG/HR Place 1 patch onto the skin every 3 (three) days.  Marland Kitchen glucose blood (ACCU-CHEK GUIDE) test strip Use as directed to test blood sugar once daily. DX E11.9  . lidocaine-prilocaine (EMLA) cream Apply 1 application topically as needed. (Patient taking differently: Apply 1 application topically as needed (access). )  . LORazepam (ATIVAN) 0.5  MG tablet 1 tablet p.o. twice daily as needed for anxiety.  . megestrol (MEGACE ES) 625 MG/5ML suspension Take 5 mLs (625 mg total) by mouth daily.  . megestrol (MEGACE) 20 MG tablet Take 1 tablet (20 mg total) by mouth daily.  . mirtazapine (REMERON) 30 MG tablet Take 1 tablet (30 mg total) by mouth at bedtime.  . nebivolol (BYSTOLIC) 5 MG tablet Take 1 tablet (5 mg total) by mouth daily.  . nitroGLYCERIN (NITROSTAT) 0.4 MG SL tablet Place 1 tablet (0.4 mg total) under the tongue every 5 (five) minutes as needed for chest pain.  . Oxycodone HCl 10 MG TABS Take by mouth.  . oxyCODONE-acetaminophen (PERCOCET/ROXICET) 5-325 MG tablet Take 1 tablet by  mouth every 6 (six) hours as needed for severe pain.  . pantoprazole (PROTONIX) 40 MG tablet Take 1 tablet (40 mg total) by mouth daily.  . prochlorperazine (COMPAZINE) 10 MG tablet Take 1 tablet (10 mg total) by mouth every 6 (six) hours as needed for nausea or vomiting.  . ranolazine (RANEXA) 1000 MG SR tablet Take 1 tablet (1,000 mg total) by mouth 2 (two) times daily. (Patient not taking: Reported on 07/03/2019)  . rosuvastatin (CRESTOR) 20 MG tablet Take 1 tablet (20 mg total) by mouth daily.  . sucralfate (CARAFATE) 1 g tablet Take 1 tablet by mouth 4 (four) times daily -  before meals and at bedtime.  Marland Kitchen tiotropium (SPIRIVA HANDIHALER) 18 MCG inhalation capsule Place 1 capsule (18 mcg total) into inhaler and inhale daily.  . vitamin B-12 (CYANOCOBALAMIN) 500 MCG tablet Take 1 tablet (500 mcg total) by mouth daily.  Marland Kitchen zolpidem (AMBIEN) 5 MG tablet Take 1 tablet (5 mg total) by mouth at bedtime.   No facility-administered encounter medications on file as of 07/12/2019.        Birdia Jaycox Marlena Clipper, NP

## 2019-07-13 ENCOUNTER — Other Ambulatory Visit: Payer: Self-pay

## 2019-07-16 ENCOUNTER — Other Ambulatory Visit: Payer: Self-pay | Admitting: Internal Medicine

## 2019-07-16 ENCOUNTER — Telehealth: Payer: Self-pay | Admitting: Medical Oncology

## 2019-07-16 ENCOUNTER — Other Ambulatory Visit: Payer: Self-pay | Admitting: Family Medicine

## 2019-07-16 ENCOUNTER — Telehealth: Payer: Self-pay | Admitting: Radiation Oncology

## 2019-07-16 ENCOUNTER — Telehealth: Payer: Self-pay | Admitting: Family Medicine

## 2019-07-16 ENCOUNTER — Telehealth: Payer: Self-pay | Admitting: Adult Health Nurse Practitioner

## 2019-07-16 DIAGNOSIS — C3490 Malignant neoplasm of unspecified part of unspecified bronchus or lung: Secondary | ICD-10-CM | POA: Diagnosis not present

## 2019-07-16 DIAGNOSIS — C787 Secondary malignant neoplasm of liver and intrahepatic bile duct: Secondary | ICD-10-CM | POA: Diagnosis not present

## 2019-07-16 DIAGNOSIS — Z515 Encounter for palliative care: Secondary | ICD-10-CM | POA: Diagnosis not present

## 2019-07-16 DIAGNOSIS — C7951 Secondary malignant neoplasm of bone: Secondary | ICD-10-CM | POA: Diagnosis not present

## 2019-07-16 DIAGNOSIS — G4709 Other insomnia: Secondary | ICD-10-CM

## 2019-07-16 MED ORDER — OXYCODONE-ACETAMINOPHEN 5-325 MG PO TABS: 1.0000 | ORAL_TABLET | Freq: Four times a day (QID) | ORAL | 0 refills | Status: AC | PRN

## 2019-07-16 MED ORDER — FENTANYL 50 MCG/HR TD PT72: 1.0000 | MEDICATED_PATCH | TRANSDERMAL | 0 refills | Status: AC

## 2019-07-16 MED FILL — OXYCODONE-ACETAMINOPHEN 5-3: 5-325 | 7 days supply | Qty: 30 | Fill #0

## 2019-07-16 NOTE — Telephone Encounter (Signed)
Returning patient's call.  Patient has appointment on Wednesday at [facility] that he needs to reschedule this. Changed appointment to Friday, 8/28, at 11:30.   Tanysha Quant K. Olena Heckle NP

## 2019-07-16 NOTE — Telephone Encounter (Signed)
Refill request

## 2019-07-16 NOTE — Telephone Encounter (Signed)
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  Interval Since Last Radiation:  4 weeks   05/16/2019-06/20/2019: The Lumbar spine was treated to 37.5 Gy in 15 fractions.  02/14/2019 - 02/27/2019 PCI: Whole Brain / 25 Gy in 10 fractions planned but  / 20 Gy in 8 fractionsdelivered due to unsteady gait.   09/07/2018 - 10/30/2018: The patient was treated to the disease within the rightlung initially to a dose of 60 Gy in 30 fractions. The patient then received a boost treatment for an additional 6 Gydelivered in 3 fractions. This yielded a final total dose of 66 Gy.  Narrative:  In summary Mr. Kubes recently underwent palliative radiotherapy to address disease in the L4 vertebral body and received 3 weeks of treatment due to the ongoing chemotherapy regimen he receives. The patient was contacted today for routine follow-up. During treatment he did well with minimal side effects.   Impression/Plan: 1. 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 The patient has been doing well since completion of radiotherapy. We discussed that we will plan to continue his brain surveillance with MRI every 3 months this first year since completing PCI and will be due for this assessment next month. He is in the midst of having outpatient palliative care evaluation as he is no longer a candidate for chemotherapy due to progressive disease on treatment and persistent pancytopenia with his regimens. He had a second opinion at [facility] with Dr Kennedy Bucker and is awaiting results from serum molecular testing to see if there is a  targeted therapy he would benefit from. We discussed that depending on his course, we may or may not continue routine MRI of the brain. He is in agreement.  2. Pain secondary to #1. Palliative care has been trying to meet with him to manage, but [facility] doctors are also managing this. We will defer to the providers he is seeing rather than making any adjustments in his regimen.    Osker Mason, PAC

## 2019-07-17 DIAGNOSIS — C3490 Malignant neoplasm of unspecified part of unspecified bronchus or lung: Secondary | ICD-10-CM | POA: Diagnosis not present

## 2019-07-18 ENCOUNTER — Other Ambulatory Visit: Payer: Medicare Other | Admitting: Adult Health Nurse Practitioner

## 2019-07-18 DIAGNOSIS — C801 Malignant (primary) neoplasm, unspecified: Secondary | ICD-10-CM | POA: Diagnosis not present

## 2019-07-18 DIAGNOSIS — M48061 Spinal stenosis, lumbar region without neurogenic claudication: Secondary | ICD-10-CM | POA: Diagnosis not present

## 2019-07-18 DIAGNOSIS — C7951 Secondary malignant neoplasm of bone: Secondary | ICD-10-CM | POA: Diagnosis not present

## 2019-07-18 DIAGNOSIS — M47816 Spondylosis without myelopathy or radiculopathy, lumbar region: Secondary | ICD-10-CM | POA: Diagnosis not present

## 2019-07-19 DIAGNOSIS — Z923 Personal history of irradiation: Secondary | ICD-10-CM | POA: Diagnosis not present

## 2019-07-19 DIAGNOSIS — Z79899 Other long term (current) drug therapy: Secondary | ICD-10-CM | POA: Diagnosis not present

## 2019-07-19 DIAGNOSIS — Z87891 Personal history of nicotine dependence: Secondary | ICD-10-CM | POA: Diagnosis not present

## 2019-07-19 DIAGNOSIS — Z9221 Personal history of antineoplastic chemotherapy: Secondary | ICD-10-CM | POA: Diagnosis not present

## 2019-07-19 DIAGNOSIS — C7951 Secondary malignant neoplasm of bone: Secondary | ICD-10-CM | POA: Diagnosis not present

## 2019-07-19 DIAGNOSIS — M545 Low back pain: Secondary | ICD-10-CM | POA: Diagnosis not present

## 2019-07-19 DIAGNOSIS — R52 Pain, unspecified: Secondary | ICD-10-CM | POA: Diagnosis not present

## 2019-07-19 DIAGNOSIS — G8929 Other chronic pain: Secondary | ICD-10-CM | POA: Diagnosis not present

## 2019-07-19 DIAGNOSIS — Z515 Encounter for palliative care: Secondary | ICD-10-CM | POA: Diagnosis not present

## 2019-07-19 DIAGNOSIS — C3491 Malignant neoplasm of unspecified part of right bronchus or lung: Secondary | ICD-10-CM | POA: Diagnosis not present

## 2019-07-19 DIAGNOSIS — C3490 Malignant neoplasm of unspecified part of unspecified bronchus or lung: Secondary | ICD-10-CM | POA: Diagnosis not present

## 2019-07-19 DIAGNOSIS — C801 Malignant (primary) neoplasm, unspecified: Secondary | ICD-10-CM | POA: Diagnosis not present

## 2019-07-20 ENCOUNTER — Other Ambulatory Visit: Payer: Medicare Other | Admitting: Adult Health Nurse Practitioner

## 2019-07-20 ENCOUNTER — Other Ambulatory Visit: Payer: Self-pay

## 2019-07-20 ENCOUNTER — Telehealth: Payer: Self-pay | Admitting: Adult Health Nurse Practitioner

## 2019-07-20 DIAGNOSIS — Z515 Encounter for palliative care: Secondary | ICD-10-CM

## 2019-07-20 NOTE — Progress Notes (Signed)
Therapist, nutritional Palliative Care Consult Note Telephone: [phone]  Fax: [phone]  PATIENT NAME: Andrew West DOB: 29-Nov-1965 MRN: [id number]  PRIMARY CARE PROVIDER:   [name], MD  REFERRING PROVIDER: Dr. Si Gaul  RESPONSIBLE PARTY:   Self [phone]     RECOMMENDATIONS and PLAN:  Originally had appointment with patient on Wednesday and he had called on Tuesday to reschedule for today.  Went to patient's home and rang door bell and knocked on door with no answer.  Scheduler did call and rescheduled for next week.  Patient said that he had forgotten about it.      HISTORY OF PRESENT ILLNESS:  Andrew West is a 53 y.o. year old male with multiple medical problems including small cell lung cancer of RUL, CAD, DMT2, HTN, OSA, COPD. Palliative Care was asked to help address goals of care.   CODE STATUS: full code  PPS: 60% HOSPICE ELIGIBILITY/DIAGNOSIS: TBD  PAST MEDICAL HISTORY:  Past Medical History:  Diagnosis Date  . Anxiety   . CAD (coronary artery disease)    a. s/p multiple PCIs with last cath 11/2016 with severe multivessel CAD, s/p PCTA to LCx but unable to pass stent  . Chronic leg pain    bilateral  . Chronic lower back pain   . COPD (chronic obstructive pulmonary disease) (HCC)   . Depression   . GERD (gastroesophageal reflux disease)    Takes Dexilant  . HLD (hyperlipidemia)   . Hypertension   . 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 Rhabdomyolysis    h/o, r/t statins  . Sleep apnea    "can't tolerate mask" (12/16/2016)  . Type II diabetes mellitus (HCC)     SOCIAL HX:  Social History   Tobacco Use  . Smoking status: Former Smoker    Packs/day: 0.25    Years: 25.00    Pack years: 6.25    Types: Cigarettes    Quit date: 05/24/2015    Years since quitting: 4.1  . Smokeless tobacco: Never Used  Substance Use Topics  . Alcohol use: No   Alcohol/week: 0.0 standard drinks    ALLERGIES:  Allergies  Allergen Reactions  . Iohexol Anaphylaxis    PT. TO BE PREMEDICATED PRIOR TO IV CONTRAST PER DR Eppie Gibson /MMS//12/15/15Desc: PT BECAME SOB AND CHEST TIGHTNESS AFTER CONTRAST INJECTION.  Ardis Hughs., Onset Date: [id number]      PERTINENT MEDICATIONS:  Outpatient Encounter Medications as of 07/20/2019  Medication Sig  . Accu-Chek FastClix Lancets MISC Use as directed to test blood sugar once daily. DX E11.9  . acetaminophen (TYLENOL) 500 MG tablet Take 1,000 mg by mouth every 6 (six) hours as needed for moderate pain.  . Blood Glucose Monitoring Suppl (ACCU-CHEK GUIDE) w/Device KIT 1 each by Does not apply route daily. Use as directed to test blood sugar once daily. DX E11.9  . fentaNYL (DURAGESIC) 50 MCG/HR Place 1 patch onto the skin every 3 (three) days.  Marland Kitchen glucose blood (ACCU-CHEK GUIDE) test strip Use as directed to test blood sugar once daily. DX E11.9  . lidocaine-prilocaine (EMLA) cream Apply 1 application topically as needed. (Patient taking differently: Apply 1 application topically as needed (access). )  . LORazepam (ATIVAN) 0.5 MG tablet 1 tablet p.o. twice daily as needed for anxiety.  . megestrol (MEGACE ES) 625 MG/5ML suspension Take 5 mLs (625 mg total) by mouth daily.  . megestrol (  MEGACE) 20 MG tablet Take 1 tablet (20 mg total) by mouth daily.  . mirtazapine (REMERON) 30 MG tablet Take 1 tablet (30 mg total) by mouth at bedtime.  . nebivolol (BYSTOLIC) 5 MG tablet Take 1 tablet (5 mg total) by mouth daily.  . nitroGLYCERIN (NITROSTAT) 0.4 MG SL tablet Place 1 tablet (0.4 mg total) under the tongue every 5 (five) minutes as needed for chest pain.  . Oxycodone HCl 10 MG TABS Take by mouth.  . oxyCODONE-acetaminophen (PERCOCET/ROXICET) 5-325 MG tablet Take 1 tablet by mouth every 6 (six) hours as needed for severe pain.  . pantoprazole (PROTONIX) 40 MG tablet Take 1 tablet (40 mg total) by mouth daily.  .  prochlorperazine (COMPAZINE) 10 MG tablet Take 1 tablet (10 mg total) by mouth every 6 (six) hours as needed for nausea or vomiting.  . ranolazine (RANEXA) 1000 MG SR tablet Take 1 tablet (1,000 mg total) by mouth 2 (two) times daily. (Patient not taking: Reported on 07/03/2019)  . rosuvastatin (CRESTOR) 20 MG tablet Take 1 tablet (20 mg total) by mouth daily.  . sucralfate (CARAFATE) 1 g tablet Take 1 tablet by mouth 4 (four) times daily -  before meals and at bedtime.  Marland Kitchen tiotropium (SPIRIVA HANDIHALER) 18 MCG inhalation capsule Place 1 capsule (18 mcg total) into inhaler and inhale daily.  . vitamin B-12 (CYANOCOBALAMIN) 500 MCG tablet Take 1 tablet (500 mcg total) by mouth daily.  Marland Kitchen zolpidem (AMBIEN) 5 MG tablet TAKE 1 TABLET BY MOUTH EVERY DAY AT BEDTIME   No facility-administered encounter medications on file as of 07/20/2019.      Elissia Spiewak Marlena Clipper, NP

## 2019-07-20 NOTE — Telephone Encounter (Signed)
Rec'd call from Hanley Ben, NP saying that she had an in-person visit scheduled with patient today but no one came to the door and requested that I call.  I called patient to let him know that NP was there and he said that he had forgotten about the appointment.  I have rescheduled it for 07/26/19 @ 1:30 PM

## 2019-07-25 ENCOUNTER — Other Ambulatory Visit: Payer: Self-pay | Admitting: Internal Medicine

## 2019-07-25 ENCOUNTER — Telehealth: Payer: Self-pay | Admitting: *Deleted

## 2019-07-25 DIAGNOSIS — C3411 Malignant neoplasm of upper lobe, right bronchus or lung: Secondary | ICD-10-CM

## 2019-07-25 MED FILL — MEGESTROL 20 MG TABLET: 20 | 30 days supply | Qty: 30 | Fill #1

## 2019-07-25 NOTE — Telephone Encounter (Signed)
Andrew West states he is eating "very good" and would like to have G-tube removed.  He spoke with oncologist at [facility] who stated that since we ordered placement, we would have to order the removal.

## 2019-07-25 NOTE — Telephone Encounter (Signed)
I will be happy to order it to be removed but if he needs another one in the future, the doctor at [facility] can order it. Thank you.

## 2019-07-25 NOTE — Progress Notes (Unsigned)
remov

## 2019-07-26 ENCOUNTER — Other Ambulatory Visit: Payer: Medicare Other | Admitting: Adult Health Nurse Practitioner

## 2019-07-26 ENCOUNTER — Other Ambulatory Visit: Payer: Self-pay

## 2019-07-26 DIAGNOSIS — Z515 Encounter for palliative care: Secondary | ICD-10-CM | POA: Diagnosis not present

## 2019-07-26 NOTE — Progress Notes (Signed)
Therapist, nutritional Palliative Care Consult Note Telephone: [phone]  Fax: [phone]  PATIENT NAME: Andrew West DOB: 01/24/1966 MRN: [id number]  PRIMARY CARE PROVIDER:   [name], MD  REFERRING PROVIDER: Dr. Si Gaul  RESPONSIBLE PARTY:   Self [phone]        RECOMMENDATIONS and PLAN:  1.  Advanced Care Planning.  Patient is full code and wishes to be here as long as he can for his family.  Is not ready to give up on chemo but also understands that his cancer is going to shorten his life.  Is willing to have hospice when the time comes.  Patient was very sleepy and we did not go over MOST form today.  2.  Lung cancer.  Patient has next appointment at [facility] on 9/17.  States that they are supposed to be starting him on an oral chemotherapy treatment but did not know the name of it.  Continue with oncology recommendations.  3.  Pain.  While at [facility] his pain medication was increased to fentanyl 47mcg/hr patches and oxycodone IR 10 mg every 4 hours PRN.  He states that this has helped some but still rates his pain 6/10 after taking the 10 mg of oxy. States that he has another visit with pain specialist to discuss pain management.  Continue recommendations by pain specialist.  4.  Mobility.  Patient is still able to walk with a cane.  He is having more episodes of instability and his wife states that he shakes when he first stands up and then has to catch himself when he starts to walk.  He does complain of light headedness at rest and while standing. This comes and goes.  Checked BP sitting (110/70) and standing (110/60).  He does have some orthostatic hypotension with diastolic BP dropping 10 mm Hg.  He states the only blood pressure medication he takes is Bystolic 5mg  daily.  May consider dropping this to 2.5 mg daily.  He does state that he drinks plenty of water.  5.  Appetite.  His appetite has improved and  has been eating more oral nutrition.  Does not use PEG tube for nutrition and has plans to have this removed.    6.  Sleep disturbance.  Patient's wife states that he has been sleeping more during the day and not very well at night.  Have suggested that he could try OTC melatonin to see if this would help regulate his sleep/wake cycle   I spent 45 minutes providing this consultation,  from 1:30 to 2:15. More than 50% of the time in this consultation was spent coordinating communication.   HISTORY OF PRESENT ILLNESS:  Andrew West is a 53 y.o. year old male with multiple medical problems including small cell lung cancer of RUL, CAD, DMT2, HTN, OSA, COPD. Palliative Care was asked to help address goals of care.   CODE STATUS: Full Code  PPS: 60% HOSPICE ELIGIBILITY/DIAGNOSIS: TBD  PAST MEDICAL HISTORY:  Past Medical History:  Diagnosis Date  . Anxiety   . CAD (coronary artery disease)    a. s/p multiple PCIs with last cath 11/2016 with severe multivessel CAD, s/p PCTA to LCx but unable to pass stent  . Chronic leg pain    bilateral  . Chronic lower back pain   . COPD (chronic obstructive pulmonary disease) (HCC)   . Depression   . GERD (gastroesophageal reflux disease)    Takes Dexilant  .  HLD (hyperlipidemia)   . Hypertension   . 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 Rhabdomyolysis    h/o, r/t statins  . Sleep apnea    "can't tolerate mask" (12/16/2016)  . Type II diabetes mellitus (HCC)     SOCIAL HX:  Social History   Tobacco Use  . Smoking status: Former Smoker    Packs/day: 0.25    Years: 25.00    Pack years: 6.25    Types: Cigarettes    Quit date: 05/24/2015    Years since quitting: 4.1  . Smokeless tobacco: Never Used  Substance Use Topics  . Alcohol use: No    Alcohol/week: 0.0 standard drinks    ALLERGIES:  Allergies  Allergen Reactions  . Iohexol Anaphylaxis    PT. TO BE PREMEDICATED PRIOR TO IV  CONTRAST PER DR Eppie Gibson /MMS//12/15/15Desc: PT BECAME SOB AND CHEST TIGHTNESS AFTER CONTRAST INJECTION.  Ardis Hughs., Onset Date: [id number]      PERTINENT MEDICATIONS:  Outpatient Encounter Medications as of 07/26/2019  Medication Sig  . Accu-Chek FastClix Lancets MISC Use as directed to test blood sugar once daily. DX E11.9  . acetaminophen (TYLENOL) 500 MG tablet Take 1,000 mg by mouth every 6 (six) hours as needed for moderate pain.  . Blood Glucose Monitoring Suppl (ACCU-CHEK GUIDE) w/Device KIT 1 each by Does not apply route daily. Use as directed to test blood sugar once daily. DX E11.9  . fentaNYL (DURAGESIC) 50 MCG/HR Place 1 patch onto the skin every 3 (three) days.  Marland Kitchen glucose blood (ACCU-CHEK GUIDE) test strip Use as directed to test blood sugar once daily. DX E11.9  . lidocaine-prilocaine (EMLA) cream Apply 1 application topically as needed. (Patient taking differently: Apply 1 application topically as needed (access). )  . LORazepam (ATIVAN) 0.5 MG tablet 1 tablet p.o. twice daily as needed for anxiety.  . megestrol (MEGACE ES) 625 MG/5ML suspension Take 5 mLs (625 mg total) by mouth daily.  . megestrol (MEGACE) 20 MG tablet Take 1 tablet (20 mg total) by mouth daily.  . mirtazapine (REMERON) 30 MG tablet Take 1 tablet (30 mg total) by mouth at bedtime.  . nebivolol (BYSTOLIC) 5 MG tablet Take 1 tablet (5 mg total) by mouth daily.  . nitroGLYCERIN (NITROSTAT) 0.4 MG SL tablet Place 1 tablet (0.4 mg total) under the tongue every 5 (five) minutes as needed for chest pain.  . Oxycodone HCl 10 MG TABS Take by mouth.  . oxyCODONE-acetaminophen (PERCOCET/ROXICET) 5-325 MG tablet Take 1 tablet by mouth every 6 (six) hours as needed for severe pain.  . pantoprazole (PROTONIX) 40 MG tablet Take 1 tablet (40 mg total) by mouth daily.  . prochlorperazine (COMPAZINE) 10 MG tablet Take 1 tablet (10 mg total) by mouth every 6 (six) hours as needed for nausea or vomiting.  . ranolazine  (RANEXA) 1000 MG SR tablet Take 1 tablet (1,000 mg total) by mouth 2 (two) times daily. (Patient not taking: Reported on 07/03/2019)  . rosuvastatin (CRESTOR) 20 MG tablet Take 1 tablet (20 mg total) by mouth daily.  . sucralfate (CARAFATE) 1 g tablet Take 1 tablet by mouth 4 (four) times daily -  before meals and at bedtime.  Marland Kitchen tiotropium (SPIRIVA HANDIHALER) 18 MCG inhalation capsule Place 1 capsule (18 mcg total) into inhaler and inhale daily.  . vitamin B-12 (CYANOCOBALAMIN) 500 MCG tablet Take 1 tablet (500 mcg total) by mouth daily.  Marland Kitchen zolpidem (  AMBIEN) 5 MG tablet TAKE 1 TABLET BY MOUTH EVERY DAY AT BEDTIME   No facility-administered encounter medications on file as of 07/26/2019.      Adriona Kaney Marlena Clipper, NP

## 2019-07-31 ENCOUNTER — Other Ambulatory Visit: Payer: Self-pay

## 2019-07-31 ENCOUNTER — Encounter (HOSPITAL_COMMUNITY): Payer: Self-pay | Admitting: Interventional Radiology

## 2019-07-31 ENCOUNTER — Ambulatory Visit (HOSPITAL_COMMUNITY)
Admission: RE | Admit: 2019-07-31 | Discharge: 2019-07-31 | Disposition: A | Discharge status: Home / Self Care | Payer: Medicare Other | Source: Ambulatory Visit | Attending: Internal Medicine | Admitting: Internal Medicine

## 2019-07-31 DIAGNOSIS — C3411 Malignant neoplasm of upper lobe, right bronchus or lung: Secondary | ICD-10-CM | POA: Diagnosis not present

## 2019-07-31 DIAGNOSIS — Z931 Gastrostomy status: Secondary | ICD-10-CM | POA: Diagnosis not present

## 2019-07-31 DIAGNOSIS — Z431 Encounter for attention to gastrostomy: Secondary | ICD-10-CM | POA: Diagnosis not present

## 2019-07-31 HISTORY — PX: IR GASTROSTOMY TUBE REMOVAL: IMG5492

## 2019-07-31 MED ORDER — LIDOCAINE VISCOUS HCL 2 % MT SOLN
OROMUCOSAL | Status: DC | PRN
  Administered 2019-07-31: 15 mL

## 2019-07-31 MED ORDER — LIDOCAINE VISCOUS HCL 2 % MT SOLN
OROMUCOSAL | Status: AC
  Filled 2019-07-31: qty 15

## 2019-08-01 NOTE — Progress Notes (Signed)
Radiation Oncology         [phone] ________________________________  Name: Andrew West MRN: [id number]  Date: 06/20/2019  DOB: Jun 25, 1966  End of Treatment Note  Diagnosis:      ICD-10-CM   1. Bone metastases (HCC)  C79.51         Indication for treatment::  palliative       Radiation treatment dates:   05/16/2019 through 06/20/2019  Site/dose:   The patient was treated to the L4 vertebral body using a 4 field 3D conformal technique.  The patient received 37.5 Gray in 15 fractions.  Narrative: The patient tolerated radiation treatment relatively well.   The patient did not have significant GI issues during treatment.  Plan: The patient has completed radiation treatment. The patient will return to radiation oncology clinic for routine followup in one month. I advised the patient to call or return sooner if they have any questions or concerns related to their recovery or treatment. ________________________________  Radene Gunning, M.D., Ph.D.

## 2019-08-07 ENCOUNTER — Other Ambulatory Visit: Payer: Self-pay | Admitting: *Deleted

## 2019-08-07 DIAGNOSIS — C3411 Malignant neoplasm of upper lobe, right bronchus or lung: Secondary | ICD-10-CM

## 2019-08-13 ENCOUNTER — Other Ambulatory Visit: Payer: Self-pay | Admitting: Family Medicine

## 2019-08-13 ENCOUNTER — Other Ambulatory Visit: Payer: Self-pay | Admitting: Radiation Therapy

## 2019-08-13 DIAGNOSIS — M545 Low back pain: Secondary | ICD-10-CM | POA: Diagnosis not present

## 2019-08-13 DIAGNOSIS — Z9181 History of falling: Secondary | ICD-10-CM | POA: Diagnosis not present

## 2019-08-13 DIAGNOSIS — R638 Other symptoms and signs concerning food and fluid intake: Secondary | ICD-10-CM | POA: Diagnosis not present

## 2019-08-13 DIAGNOSIS — E860 Dehydration: Secondary | ICD-10-CM | POA: Diagnosis not present

## 2019-08-13 DIAGNOSIS — Z006 Encounter for examination for normal comparison and control in clinical research program: Secondary | ICD-10-CM | POA: Diagnosis not present

## 2019-08-13 DIAGNOSIS — D6481 Anemia due to antineoplastic chemotherapy: Secondary | ICD-10-CM | POA: Diagnosis not present

## 2019-08-13 DIAGNOSIS — C3411 Malignant neoplasm of upper lobe, right bronchus or lung: Secondary | ICD-10-CM | POA: Diagnosis not present

## 2019-08-16 ENCOUNTER — Other Ambulatory Visit: Payer: Self-pay | Admitting: Family Medicine

## 2019-08-16 DIAGNOSIS — Z9221 Personal history of antineoplastic chemotherapy: Secondary | ICD-10-CM | POA: Diagnosis not present

## 2019-08-16 DIAGNOSIS — J449 Chronic obstructive pulmonary disease, unspecified: Secondary | ICD-10-CM | POA: Diagnosis not present

## 2019-08-16 DIAGNOSIS — Z87891 Personal history of nicotine dependence: Secondary | ICD-10-CM | POA: Diagnosis not present

## 2019-08-16 DIAGNOSIS — M5116 Intervertebral disc disorders with radiculopathy, lumbar region: Secondary | ICD-10-CM | POA: Diagnosis not present

## 2019-08-16 DIAGNOSIS — F329 Major depressive disorder, single episode, unspecified: Secondary | ICD-10-CM | POA: Diagnosis not present

## 2019-08-16 DIAGNOSIS — N183 Chronic kidney disease, stage 3 (moderate): Secondary | ICD-10-CM | POA: Diagnosis not present

## 2019-08-16 DIAGNOSIS — R52 Pain, unspecified: Secondary | ICD-10-CM | POA: Diagnosis not present

## 2019-08-16 DIAGNOSIS — C7951 Secondary malignant neoplasm of bone: Secondary | ICD-10-CM | POA: Diagnosis not present

## 2019-08-16 DIAGNOSIS — C801 Malignant (primary) neoplasm, unspecified: Secondary | ICD-10-CM | POA: Diagnosis not present

## 2019-08-16 DIAGNOSIS — Z923 Personal history of irradiation: Secondary | ICD-10-CM | POA: Diagnosis not present

## 2019-08-16 DIAGNOSIS — C3490 Malignant neoplasm of unspecified part of unspecified bronchus or lung: Secondary | ICD-10-CM | POA: Diagnosis not present

## 2019-08-16 DIAGNOSIS — M545 Low back pain: Secondary | ICD-10-CM | POA: Diagnosis not present

## 2019-08-16 DIAGNOSIS — G893 Neoplasm related pain (acute) (chronic): Secondary | ICD-10-CM | POA: Diagnosis not present

## 2019-08-16 DIAGNOSIS — E1142 Type 2 diabetes mellitus with diabetic polyneuropathy: Secondary | ICD-10-CM | POA: Diagnosis not present

## 2019-08-16 DIAGNOSIS — Z79899 Other long term (current) drug therapy: Secondary | ICD-10-CM | POA: Diagnosis not present

## 2019-08-16 DIAGNOSIS — G4709 Other insomnia: Secondary | ICD-10-CM

## 2019-08-16 MED FILL — DULoxetine HCL 30 MG CPEP: 30 | 30 days supply | Qty: 30 | Fill #0

## 2019-08-16 MED FILL — LORazepam 0.5 MG TABS: 0.5 | 5 days supply | Qty: 15 | Fill #0

## 2019-08-17 MED FILL — XTAMPZA ER 27 MG CAPSULE: 27 | 30 days supply | Qty: 60 | Fill #0

## 2019-08-21 ENCOUNTER — Ambulatory Visit (HOSPITAL_COMMUNITY): Admission: RE | Admit: 2019-08-21 | Payer: Medicare Other | Source: Ambulatory Visit

## 2019-08-22 ENCOUNTER — Inpatient Hospital Stay: Payer: Medicare Other

## 2019-08-22 ENCOUNTER — Ambulatory Visit: Payer: Medicare Other | Admitting: Radiation Oncology

## 2019-08-27 ENCOUNTER — Ambulatory Visit: Payer: Self-pay | Admitting: Radiation Oncology

## 2019-08-28 ENCOUNTER — Telehealth: Payer: Self-pay | Admitting: Radiation Therapy

## 2019-08-28 NOTE — Telephone Encounter (Signed)
Spoke with [name] about his rescheduled brain MRI and follow-up phone visit. He was thankful for the call.  Mont Dutton R.T.(R)(T) Special Procedures Navigator

## 2019-08-30 DIAGNOSIS — F329 Major depressive disorder, single episode, unspecified: Secondary | ICD-10-CM | POA: Diagnosis not present

## 2019-08-30 DIAGNOSIS — R627 Adult failure to thrive: Secondary | ICD-10-CM | POA: Diagnosis not present

## 2019-08-30 DIAGNOSIS — C801 Malignant (primary) neoplasm, unspecified: Secondary | ICD-10-CM | POA: Diagnosis not present

## 2019-08-30 DIAGNOSIS — N183 Chronic kidney disease, stage 3 unspecified: Secondary | ICD-10-CM | POA: Diagnosis not present

## 2019-08-30 DIAGNOSIS — C3490 Malignant neoplasm of unspecified part of unspecified bronchus or lung: Secondary | ICD-10-CM | POA: Diagnosis not present

## 2019-08-30 DIAGNOSIS — K222 Esophageal obstruction: Secondary | ICD-10-CM | POA: Diagnosis not present

## 2019-08-30 DIAGNOSIS — E1142 Type 2 diabetes mellitus with diabetic polyneuropathy: Secondary | ICD-10-CM | POA: Diagnosis not present

## 2019-08-30 DIAGNOSIS — J449 Chronic obstructive pulmonary disease, unspecified: Secondary | ICD-10-CM | POA: Diagnosis not present

## 2019-08-30 DIAGNOSIS — D61818 Other pancytopenia: Secondary | ICD-10-CM | POA: Diagnosis not present

## 2019-08-30 DIAGNOSIS — C7951 Secondary malignant neoplasm of bone: Secondary | ICD-10-CM | POA: Diagnosis not present

## 2019-08-30 DIAGNOSIS — C3411 Malignant neoplasm of upper lobe, right bronchus or lung: Secondary | ICD-10-CM | POA: Diagnosis not present

## 2019-08-30 DIAGNOSIS — D696 Thrombocytopenia, unspecified: Secondary | ICD-10-CM | POA: Diagnosis not present

## 2019-08-30 DIAGNOSIS — Z87891 Personal history of nicotine dependence: Secondary | ICD-10-CM | POA: Diagnosis not present

## 2019-08-30 DIAGNOSIS — M545 Low back pain: Secondary | ICD-10-CM | POA: Diagnosis not present

## 2019-08-30 DIAGNOSIS — R52 Pain, unspecified: Secondary | ICD-10-CM | POA: Diagnosis not present

## 2019-08-30 DIAGNOSIS — Z006 Encounter for examination for normal comparison and control in clinical research program: Secondary | ICD-10-CM | POA: Diagnosis not present

## 2019-08-30 DIAGNOSIS — R530 Neoplastic (malignant) related fatigue: Secondary | ICD-10-CM | POA: Diagnosis not present

## 2019-08-30 DIAGNOSIS — K219 Gastro-esophageal reflux disease without esophagitis: Secondary | ICD-10-CM | POA: Diagnosis not present

## 2019-08-31 DIAGNOSIS — C3490 Malignant neoplasm of unspecified part of unspecified bronchus or lung: Secondary | ICD-10-CM | POA: Diagnosis not present

## 2019-08-31 DIAGNOSIS — C3431 Malignant neoplasm of lower lobe, right bronchus or lung: Secondary | ICD-10-CM | POA: Diagnosis not present

## 2019-08-31 DIAGNOSIS — G4733 Obstructive sleep apnea (adult) (pediatric): Secondary | ICD-10-CM | POA: Diagnosis not present

## 2019-08-31 DIAGNOSIS — I130 Hypertensive heart and chronic kidney disease with heart failure and stage 1 through stage 4 chronic kidney disease, or unspecified chronic kidney disease: Secondary | ICD-10-CM | POA: Diagnosis not present

## 2019-08-31 DIAGNOSIS — F329 Major depressive disorder, single episode, unspecified: Secondary | ICD-10-CM | POA: Diagnosis not present

## 2019-08-31 DIAGNOSIS — E785 Hyperlipidemia, unspecified: Secondary | ICD-10-CM | POA: Diagnosis not present

## 2019-08-31 DIAGNOSIS — N529 Male erectile dysfunction, unspecified: Secondary | ICD-10-CM | POA: Diagnosis not present

## 2019-08-31 DIAGNOSIS — N183 Chronic kidney disease, stage 3 unspecified: Secondary | ICD-10-CM | POA: Diagnosis not present

## 2019-08-31 DIAGNOSIS — K7689 Other specified diseases of liver: Secondary | ICD-10-CM | POA: Diagnosis not present

## 2019-08-31 DIAGNOSIS — R918 Other nonspecific abnormal finding of lung field: Secondary | ICD-10-CM | POA: Diagnosis not present

## 2019-08-31 DIAGNOSIS — L03213 Periorbital cellulitis: Secondary | ICD-10-CM | POA: Diagnosis not present

## 2019-08-31 DIAGNOSIS — E1122 Type 2 diabetes mellitus with diabetic chronic kidney disease: Secondary | ICD-10-CM | POA: Diagnosis not present

## 2019-09-05 ENCOUNTER — Other Ambulatory Visit: Payer: Self-pay | Admitting: Family Medicine

## 2019-09-05 MED FILL — MEGESTROL 20 MG TABLET: 20 | 30 days supply | Qty: 30 | Fill #0

## 2019-09-08 ENCOUNTER — Other Ambulatory Visit: Payer: Self-pay | Admitting: Internal Medicine

## 2019-09-11 ENCOUNTER — Ambulatory Visit (HOSPITAL_COMMUNITY): Admission: RE | Admit: 2019-09-11 | Payer: Medicare Other | Source: Ambulatory Visit

## 2019-09-12 ENCOUNTER — Ambulatory Visit
Admission: RE | Admit: 2019-09-12 | Discharge: 2019-09-12 | Disposition: A | Discharge status: Home / Self Care | Payer: Medicare Other | Source: Ambulatory Visit | Attending: Radiation Oncology | Admitting: Radiation Oncology

## 2019-09-12 NOTE — Progress Notes (Signed)
Called Mr. Weldon to inform him that his appointment would be reschedule ,because he did not get his brain scan . Went to voicemail left a  message that  It would be  rescheduled.

## 2019-09-14 ENCOUNTER — Encounter: Payer: Self-pay | Admitting: Radiation Therapy

## 2019-09-14 MED FILL — OXYCODONE-ACETAMINOPHEN 5-3: 5-325 | 15 days supply | Qty: 45 | Fill #0

## 2019-09-14 MED FILL — XTAMPZA ER 27 MG CAPSULE: 27 | 30 days supply | Qty: 60 | Fill #0

## 2019-09-14 NOTE — Progress Notes (Signed)
I spoke with Andrew West today. He said that he would prefer his imaging and brain follow-up be done at [facility] with his current systemic treatment, but he would like to continue follow-up for his past radiation treatments here with Dr. Lisbeth Renshaw and Bryson Ha. He is scheduled for a brain MRI tomorrow morning at [facility] and will have the scan reviewed with him there as well.   Mont Dutton R.T.(R)(T) [facility]   Radiation Oncology  Radiation Special Procedures Navigator Office: [phone]   Pager: [phone]  [place] Website: [email or web address]

## 2019-09-17 ENCOUNTER — Telehealth: Payer: Self-pay | Admitting: Radiation Therapy

## 2019-09-17 NOTE — Telephone Encounter (Signed)
Spoke with Barnett Abu RN, the thoracic oncology navigator at [facility] per Bryson Ha Perkins's request. The patient desires to have his brain imaging and follow-up done through [facility] because he is currently involved with a clinical trial there. I have asked Lattie Haw to please help [place] with ensuring that he still has his brain follow-up imaging and if there is a need for radiation treatment in the future to please let us know. Lattie Haw was thankful for the call and said that although she has not had much interaction with [name] directly, his wife is helping her to ensure that he comes to his visits.    Andrew West R.T.(R)(T) Radiation Special Procedures Navigator

## 2019-09-19 ENCOUNTER — Inpatient Hospital Stay: Payer: Medicare Other | Attending: Internal Medicine

## 2019-09-19 ENCOUNTER — Ambulatory Visit: Payer: Medicare Other | Admitting: Radiation Oncology

## 2019-09-21 DIAGNOSIS — E119 Type 2 diabetes mellitus without complications: Secondary | ICD-10-CM | POA: Diagnosis not present

## 2019-09-21 DIAGNOSIS — H25013 Cortical age-related cataract, bilateral: Secondary | ICD-10-CM | POA: Diagnosis not present

## 2019-09-21 LAB — HM DIABETES EYE EXAM

## 2019-09-24 MED FILL — BYSTOLIC 5 MG TABLET: 5 | 90 days supply | Qty: 90 | Fill #1

## 2019-09-24 MED FILL — DULoxetine HCL 30 MG CPEP: 30 | 30 days supply | Qty: 60 | Fill #0

## 2019-09-25 ENCOUNTER — Other Ambulatory Visit: Payer: Self-pay | Admitting: Family Medicine

## 2019-09-27 ENCOUNTER — Other Ambulatory Visit: Payer: Self-pay | Admitting: Internal Medicine

## 2019-09-27 DIAGNOSIS — Z006 Encounter for examination for normal comparison and control in clinical research program: Secondary | ICD-10-CM | POA: Diagnosis not present

## 2019-09-27 DIAGNOSIS — N179 Acute kidney failure, unspecified: Secondary | ICD-10-CM | POA: Diagnosis not present

## 2019-09-27 DIAGNOSIS — D61818 Other pancytopenia: Secondary | ICD-10-CM | POA: Diagnosis not present

## 2019-09-27 DIAGNOSIS — R52 Pain, unspecified: Secondary | ICD-10-CM | POA: Diagnosis not present

## 2019-09-27 DIAGNOSIS — F419 Anxiety disorder, unspecified: Secondary | ICD-10-CM | POA: Diagnosis not present

## 2019-09-27 DIAGNOSIS — M545 Low back pain: Secondary | ICD-10-CM | POA: Diagnosis not present

## 2019-09-27 DIAGNOSIS — C3411 Malignant neoplasm of upper lobe, right bronchus or lung: Secondary | ICD-10-CM | POA: Diagnosis not present

## 2019-09-27 MED FILL — LORazepam 0.5 MG TABS: 0.5 | 5 days supply | Qty: 15 | Fill #0

## 2019-09-28 DIAGNOSIS — C3431 Malignant neoplasm of lower lobe, right bronchus or lung: Secondary | ICD-10-CM | POA: Diagnosis not present

## 2019-09-28 DIAGNOSIS — C3490 Malignant neoplasm of unspecified part of unspecified bronchus or lung: Secondary | ICD-10-CM | POA: Diagnosis present

## 2019-09-28 DIAGNOSIS — F329 Major depressive disorder, single episode, unspecified: Secondary | ICD-10-CM | POA: Diagnosis present

## 2019-09-28 DIAGNOSIS — D61818 Other pancytopenia: Secondary | ICD-10-CM | POA: Diagnosis not present

## 2019-09-28 DIAGNOSIS — Z7984 Long term (current) use of oral hypoglycemic drugs: Secondary | ICD-10-CM | POA: Diagnosis not present

## 2019-09-28 DIAGNOSIS — E785 Hyperlipidemia, unspecified: Secondary | ICD-10-CM | POA: Diagnosis present

## 2019-09-28 DIAGNOSIS — Z87891 Personal history of nicotine dependence: Secondary | ICD-10-CM | POA: Diagnosis not present

## 2019-09-28 DIAGNOSIS — Z955 Presence of coronary angioplasty implant and graft: Secondary | ICD-10-CM | POA: Diagnosis not present

## 2019-09-28 DIAGNOSIS — E860 Dehydration: Secondary | ICD-10-CM | POA: Diagnosis present

## 2019-09-28 DIAGNOSIS — G8929 Other chronic pain: Secondary | ICD-10-CM | POA: Diagnosis present

## 2019-09-28 DIAGNOSIS — J449 Chronic obstructive pulmonary disease, unspecified: Secondary | ICD-10-CM | POA: Diagnosis present

## 2019-09-28 DIAGNOSIS — F419 Anxiety disorder, unspecified: Secondary | ICD-10-CM | POA: Diagnosis present

## 2019-09-28 DIAGNOSIS — C7951 Secondary malignant neoplasm of bone: Secondary | ICD-10-CM | POA: Diagnosis present

## 2019-09-28 DIAGNOSIS — N179 Acute kidney failure, unspecified: Secondary | ICD-10-CM | POA: Diagnosis not present

## 2019-09-28 DIAGNOSIS — C3491 Malignant neoplasm of unspecified part of right bronchus or lung: Secondary | ICD-10-CM | POA: Diagnosis not present

## 2019-09-28 DIAGNOSIS — R627 Adult failure to thrive: Secondary | ICD-10-CM | POA: Diagnosis present

## 2019-09-28 DIAGNOSIS — E119 Type 2 diabetes mellitus without complications: Secondary | ICD-10-CM | POA: Diagnosis present

## 2019-09-28 DIAGNOSIS — I10 Essential (primary) hypertension: Secondary | ICD-10-CM | POA: Diagnosis present

## 2019-09-28 DIAGNOSIS — Z79899 Other long term (current) drug therapy: Secondary | ICD-10-CM | POA: Diagnosis not present

## 2019-09-28 DIAGNOSIS — I2510 Atherosclerotic heart disease of native coronary artery without angina pectoris: Secondary | ICD-10-CM | POA: Diagnosis present

## 2019-10-01 ENCOUNTER — Encounter: Payer: Self-pay | Admitting: Family Medicine

## 2019-10-01 ENCOUNTER — Ambulatory Visit: Payer: Medicare Other | Attending: Family Medicine | Admitting: Family Medicine

## 2019-10-01 ENCOUNTER — Other Ambulatory Visit: Payer: Self-pay

## 2019-10-01 VITALS — BP 121/82 | HR 98 | Temp 98.2°F | Ht 69.0 in | Wt 186.0 lb

## 2019-10-01 DIAGNOSIS — J449 Chronic obstructive pulmonary disease, unspecified: Secondary | ICD-10-CM | POA: Insufficient documentation

## 2019-10-01 DIAGNOSIS — G4709 Other insomnia: Secondary | ICD-10-CM | POA: Diagnosis not present

## 2019-10-01 DIAGNOSIS — E785 Hyperlipidemia, unspecified: Secondary | ICD-10-CM | POA: Diagnosis not present

## 2019-10-01 DIAGNOSIS — Z8249 Family history of ischemic heart disease and other diseases of the circulatory system: Secondary | ICD-10-CM | POA: Insufficient documentation

## 2019-10-01 DIAGNOSIS — I10 Essential (primary) hypertension: Secondary | ICD-10-CM | POA: Diagnosis not present

## 2019-10-01 DIAGNOSIS — F329 Major depressive disorder, single episode, unspecified: Secondary | ICD-10-CM | POA: Insufficient documentation

## 2019-10-01 DIAGNOSIS — Z955 Presence of coronary angioplasty implant and graft: Secondary | ICD-10-CM | POA: Insufficient documentation

## 2019-10-01 DIAGNOSIS — F419 Anxiety disorder, unspecified: Secondary | ICD-10-CM | POA: Diagnosis not present

## 2019-10-01 DIAGNOSIS — Z79899 Other long term (current) drug therapy: Secondary | ICD-10-CM | POA: Insufficient documentation

## 2019-10-01 DIAGNOSIS — Z833 Family history of diabetes mellitus: Secondary | ICD-10-CM | POA: Diagnosis not present

## 2019-10-01 DIAGNOSIS — C3411 Malignant neoplasm of upper lobe, right bronchus or lung: Secondary | ICD-10-CM | POA: Insufficient documentation

## 2019-10-01 DIAGNOSIS — M5116 Intervertebral disc disorders with radiculopathy, lumbar region: Secondary | ICD-10-CM | POA: Insufficient documentation

## 2019-10-01 DIAGNOSIS — C787 Secondary malignant neoplasm of liver and intrahepatic bile duct: Secondary | ICD-10-CM | POA: Diagnosis not present

## 2019-10-01 DIAGNOSIS — Z9861 Coronary angioplasty status: Secondary | ICD-10-CM

## 2019-10-01 DIAGNOSIS — C3490 Malignant neoplasm of unspecified part of unspecified bronchus or lung: Secondary | ICD-10-CM | POA: Diagnosis not present

## 2019-10-01 DIAGNOSIS — Z6827 Body mass index (BMI) 27.0-27.9, adult: Secondary | ICD-10-CM | POA: Insufficient documentation

## 2019-10-01 DIAGNOSIS — I2510 Atherosclerotic heart disease of native coronary artery without angina pectoris: Secondary | ICD-10-CM | POA: Diagnosis not present

## 2019-10-01 DIAGNOSIS — Z9221 Personal history of antineoplastic chemotherapy: Secondary | ICD-10-CM | POA: Diagnosis not present

## 2019-10-01 DIAGNOSIS — C7951 Secondary malignant neoplasm of bone: Secondary | ICD-10-CM | POA: Insufficient documentation

## 2019-10-01 DIAGNOSIS — E119 Type 2 diabetes mellitus without complications: Secondary | ICD-10-CM | POA: Diagnosis not present

## 2019-10-01 DIAGNOSIS — K219 Gastro-esophageal reflux disease without esophagitis: Secondary | ICD-10-CM | POA: Insufficient documentation

## 2019-10-01 DIAGNOSIS — R634 Abnormal weight loss: Secondary | ICD-10-CM | POA: Diagnosis not present

## 2019-10-01 DIAGNOSIS — Z923 Personal history of irradiation: Secondary | ICD-10-CM | POA: Insufficient documentation

## 2019-10-01 DIAGNOSIS — G4700 Insomnia, unspecified: Secondary | ICD-10-CM | POA: Diagnosis present

## 2019-10-01 DIAGNOSIS — I208 Other forms of angina pectoris: Secondary | ICD-10-CM

## 2019-10-01 LAB — POCT GLYCOSYLATED HEMOGLOBIN (HGB A1C): HbA1c, POC (prediabetic range): 5.2 % — AB (ref 5.7–6.4)

## 2019-10-01 NOTE — Progress Notes (Signed)
Subjective:  Patient ID: Andrew West, male    DOB: 1965/12/03  Age: 53 y.o. MRN: [id number]  CC:  Chief Complaint  Patient presents with   Insomnia     HPI Andrew West is a 53 y.o. 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   He is now being followed by Oncology and General Surgery at [facility] and is on Ibrance. Most recent CT chest , abdomen and pelvis from 08/31/19 revealed interval progression of metastatic disease involving chest, abdomen, liver. His appetite has improved but is not great. He has leg weakness and has fallen on a couple of occassions; ambulates with a cane at home. He complains of insomnia as he gets only 2 hours of sleep on his current Ambien dose of 5mg  and is wondering if this can be increased. Follow up appojntment with Oncology comes up.on 10/03/19. With regards to depression he has a support group he is involved with and also a therapist at [facility] and feels satisfied with this. He is accompanied by his wife today who is his major caregiver and does have support from his family and his grandchildren keep him company.  Past Medical History:  Diagnosis Date   Anxiety    CAD (coronary artery disease)    a. s/p multiple PCIs with last cath 11/2016 with severe multivessel CAD, s/p PCTA to LCx but unable to pass stent   Chronic leg pain    bilateral   Chronic lower back pain    COPD (chronic obstructive pulmonary disease) (HCC)    Depression    GERD (gastroesophageal reflux disease)    Takes Dexilant   HLD (hyperlipidemia)    Hypertension    met lung ca to bones dx'd 08/2018;  04/2019   mets to liver, spine abd pleural based mets   Rhabdomyolysis    h/o, r/t statins   Sleep apnea    "can't tolerate mask" (12/16/2016)   Type II diabetes mellitus (HCC)     Past Surgical History:  Procedure Laterality Date   BACK SURGERY     BRONCHIAL BIOPSY  08/25/2018   Procedure: BRONCHIAL BIOPSIES;  Surgeon: Josephine Igo, DO;  Location: [facility] ENDOSCOPY;  Service: Cardiopulmonary;;   CARDIAC CATHETERIZATION N/A 09/25/2015   Procedure: Left Heart Cath and Coronary Angiography;  Surgeon: Marykay Lex, MD;  Location: [facility] INVASIVE CV LAB;  Service: Cardiovascular;  Laterality: N/A;   CARDIAC CATHETERIZATION N/A 12/16/2016   Procedure: Left Heart Cath and Coronary Angiography;  Surgeon: Marykay Lex, MD;  Location: [facility] INVASIVE CV LAB;  Service: Cardiovascular;  Laterality: N/A;   CARDIAC CATHETERIZATION N/A 12/16/2016   Procedure: Coronary Balloon Angioplasty;  Surgeon: Marykay Lex, MD;  Location: [facility] INVASIVE CV LAB;  Service: Cardiovascular;  Laterality: N/A;   COLONOSCOPY W/ POLYPECTOMY     CORONARY ANGIOPLASTY  09/25/2015   mid cir & om   CORONARY ANGIOPLASTY WITH STENT PLACEMENT  10/09/2001   PTCA & stenting of mid AV circumflex; 2.5x94mm Pixel stent   CORONARY ANGIOPLASTY WITH STENT PLACEMENT  12/13/2001   PCI with stent to mid L circumflex, 95% stenosis to 0% residual  CORONARY ANGIOPLASTY WITH STENT PLACEMENT  10/10/2003   PCI to mid AV circumflex; LAD 30% disease; RCA 100% occluded prox.   CORONARY ANGIOPLASTY WITH STENT PLACEMENT  09/01/2011   PCI with stenting with bare metal stent to mid AV groove circumflex and PDA   CORONARY ANGIOPLASTY WITH STENT PLACEMENT  10/17/2011   cutting balloon angioplasty of ostial lateral OM1 branch and bifurcation AV groove circumflex OM junction; stenosis reduced to 0%   ENDOBRONCHIAL ULTRASOUND Bilateral 08/25/2018   Procedure: ENDOBRONCHIAL ULTRASOUND;  Surgeon: Josephine Igo, DO;  Location: [facility];   Service: Cardiopulmonary;  Laterality: Bilateral;   ESOPHAGOGASTRODUODENOSCOPY (EGD) WITH PROPOFOL N/A 12/08/2018   Procedure: ESOPHAGOGASTRODUODENOSCOPY (EGD) WITH PROPOFOL;  Surgeon: Rachael Fee, MD;  Location: [facility];  Service: Endoscopy;  Laterality: N/A;   EXCISIONAL HEMORRHOIDECTOMY     FINE NEEDLE ASPIRATION  08/25/2018   Procedure: FINE NEEDLE ASPIRATION;  Surgeon: Josephine Igo, DO;  Location: [facility];  Service: Cardiopulmonary;;   FLEXIBLE BRONCHOSCOPY  08/25/2018   Procedure: FLEXIBLE BRONCHOSCOPY;  Surgeon: Josephine Igo, DO;  Location: [facility];  Service: Cardiopulmonary;;   IR GASTROSTOMY TUBE MOD SED  12/11/2018   IR GASTROSTOMY TUBE MOD SED  05/17/2019   IR GASTROSTOMY TUBE REMOVAL  03/22/2019   IR GASTROSTOMY TUBE REMOVAL  07/31/2019   IR IMAGING GUIDED PORT INSERTION  09/29/2018   LEFT HEART CATHETERIZATION WITH CORONARY ANGIOGRAM N/A 10/18/2011   Procedure: LEFT HEART CATHETERIZATION WITH CORONARY ANGIOGRAM;  Surgeon: Marykay Lex, MD;  Location: [facility] CATH LAB;  Service: Cardiovascular;  Laterality: N/A;   LUMBAR LAMINECTOMY/DECOMPRESSION MICRODISCECTOMY  03/31/2012   Procedure: LUMBAR LAMINECTOMY/DECOMPRESSION MICRODISCECTOMY 1 LEVEL;  Surgeon: Temple Pacini, MD;  Location: [facility] NEURO ORS;  Service: Neurosurgery;  Laterality: Left;   TRANSTHORACIC ECHOCARDIOGRAM  07/28/2011   EF 55-65%; LVH, grade 1 diastolic dysfunction;     Family History  Problem Relation Age of Onset   Heart attack Father    Hypertension Mother    Diabetes Mother    Heart disease Brother        x 3    Heart attack Brother        deceased   Hypertension Sister    Diabetes Sister    Anesthesia problems Neg Hx    Hypotension Neg Hx    Malignant hyperthermia Neg Hx    Pseudochol deficiency Neg Hx     Allergies  Allergen Reactions   Iohexol Anaphylaxis    PT. TO BE PREMEDICATED PRIOR TO IV CONTRAST PER DR STAHL /MMS//12/15/15Desc: PT BECAME SOB AND CHEST  TIGHTNESS AFTER CONTRAST INJECTION.  [name],RT-RCT., Onset Date: [id number]     Outpatient Medications Prior to Visit  Medication Sig Dispense Refill   Accu-Chek FastClix Lancets MISC Use as directed to test blood sugar once daily. DX E11.9 102 each 12   acetaminophen (TYLENOL) 500 MG tablet Take 1,000 mg by mouth every 6 (six) hours as needed for moderate pain.     Blood Glucose Monitoring Suppl (ACCU-CHEK GUIDE) w/Device KIT 1 each by Does not apply route daily. Use as directed to test blood sugar once daily. DX E11.9 1 kit 0   cetirizine (ZYRTEC) 10 MG tablet TAKE 1 TABLET(10 MG) BY MOUTH DAILY 30 tablet 0   fentaNYL (DURAGESIC) 50 MCG/HR Place 1 patch onto the skin every 3 (three) days. 5 patch 0   glucose blood (ACCU-CHEK GUIDE) test strip Use as directed to test blood sugar once daily. DX E11.9 100 each 12  lidocaine-prilocaine (EMLA) cream Apply 1 application topically as needed. (Patient taking differently: Apply 1 application topically as needed (access). ) 30 g 0   LORazepam (ATIVAN) 0.5 MG tablet 1 tablet p.o. twice daily as needed for anxiety. 20 tablet 0   megestrol (MEGACE ES) 625 MG/5ML suspension Take 5 mLs (625 mg total) by mouth daily. 150 mL 1   megestrol (MEGACE) 20 MG tablet TAKE 1 TABLET BY MOUTH ONCE DAILY 30 tablet 1   mirtazapine (REMERON) 30 MG tablet Take 1 tablet (30 mg total) by mouth at bedtime. 30 tablet 3   nebivolol (BYSTOLIC) 5 MG tablet Take 1 tablet (5 mg total) by mouth daily. 90 tablet 3   nitroGLYCERIN (NITROSTAT) 0.4 MG SL tablet Place 1 tablet (0.4 mg total) under the tongue every 5 (five) minutes as needed for chest pain. 25 tablet 2   Oxycodone HCl 10 MG TABS Take by mouth.     oxyCODONE-acetaminophen (PERCOCET/ROXICET) 5-325 MG tablet Take 1 tablet by mouth every 6 (six) hours as needed for severe pain. 30 tablet 0   pantoprazole (PROTONIX) 40 MG tablet Take 1 tablet (40 mg total) by mouth daily. 90 tablet 1   prochlorperazine  (COMPAZINE) 10 MG tablet Take 1 tablet (10 mg total) by mouth every 6 (six) hours as needed for nausea or vomiting. 30 tablet 1   rosuvastatin (CRESTOR) 20 MG tablet Take 1 tablet (20 mg total) by mouth daily. 90 tablet 1   sucralfate (CARAFATE) 1 g tablet Take 1 tablet by mouth 4 (four) times daily -  before meals and at bedtime.  0   tiotropium (SPIRIVA HANDIHALER) 18 MCG inhalation capsule Place 1 capsule (18 mcg total) into inhaler and inhale daily. 90 capsule 1   vitamin B-12 (CYANOCOBALAMIN) 500 MCG tablet Take 1 tablet (500 mcg total) by mouth daily. 30 tablet 1   zolpidem (AMBIEN) 5 MG tablet TAKE 1 TABLET BY MOUTH AT BEDTIME 30 tablet 2   ranolazine (RANEXA) 1000 MG SR tablet Take 1 tablet (1,000 mg total) by mouth 2 (two) times daily. (Patient not taking: Reported on 07/03/2019) 180 tablet 3   No facility-administered medications prior to visit.      ROS Review of Systems  Constitutional: Positive for appetite change. Negative for activity change.  HENT: Negative for sinus pressure and sore throat.   Eyes: Negative for visual disturbance.  Respiratory: Negative for cough, chest tightness and shortness of breath.   Cardiovascular: Negative for chest pain and leg swelling.  Gastrointestinal: Negative for abdominal distention, abdominal pain, constipation and diarrhea.  Endocrine: Negative.   Genitourinary: Negative for dysuria.  Musculoskeletal: Positive for back pain. Negative for joint swelling and myalgias.  Skin: Negative for rash.  Allergic/Immunologic: Negative.   Neurological: Negative for weakness, light-headedness and numbness.  Psychiatric/Behavioral: Positive for dysphoric mood and sleep disturbance. Negative for suicidal ideas.    Objective:  BP 121/82    Pulse 98    Temp 98.2 F (36.8 C) (Oral)    Ht 5\' 9"  (1.753 m)    Wt 186 lb (84.4 kg)    SpO2 98%    BMI 27.47 kg/m   BP/Weight 10/01/2019 07/10/2019 07/09/2019  Systolic BP 121 108 104  Diastolic BP 82 68  72  Wt. (Lbs) 186 - -  BMI 27.47 - -      Physical Exam Constitutional:      Appearance: He is well-developed. He is ill-appearing (chronic).  Neck:     Vascular: No JVD.  Cardiovascular:  Rate and Rhythm: Normal rate.     Heart sounds: Normal heart sounds. No murmur.  Pulmonary:     Effort: Pulmonary effort is normal.     Breath sounds: Normal breath sounds. No wheezing or rales.  Chest:     Chest wall: No tenderness.  Abdominal:     General: Bowel sounds are normal. There is no distension.     Palpations: Abdomen is soft. There is no mass.     Tenderness: There is no abdominal tenderness.     Comments: Abdominal scars  Musculoskeletal: Normal range of motion.     Right lower leg: No edema.     Left lower leg: No edema.  Neurological:     Mental Status: He is alert and oriented to person, place, and time.  Psychiatric:        Mood and Affect: Mood normal.     CMP Latest Ref Rng & Units 07/09/2019 07/03/2019 06/26/2019  Glucose 70 - 99 mg/dL 82 75 85  BUN 6 - 20 mg/dL 19 40(J) 81(X)  Creatinine 0.61 - 1.24 mg/dL 9.14(N) 8.29(F) 6.21(H)  Sodium 135 - 145 mmol/L 140 141 141  Potassium 3.5 - 5.1 mmol/L 4.0 4.0 4.2  Chloride 98 - 111 mmol/L 111 111 108  CO2 22 - 32 mmol/L 21(L) 22 25  Calcium 8.9 - 10.3 mg/dL 8.0(L) 8.4(L) 8.9  Total Protein 6.5 - 8.1 g/dL 6.0(L) 6.7 6.7  Total Bilirubin 0.3 - 1.2 mg/dL 0.3 0.3 0.4  Alkaline Phos 38 - 126 U/L 92 71 67  AST 15 - 41 U/L 17 15 14(L)  ALT 0 - 44 U/L 59(H) 12 13    Lipid Panel     Component Value Date/Time   CHOL 180 07/25/2018 0949   TRIG 168 (H) 12/11/2018 0443   HDL 24 (L) 07/25/2018 0949   CHOLHDL 7.5 (H) 07/25/2018 0949   CHOLHDL 5.2 (H) 09/21/2016 0900   VLDL 31 (H) 09/21/2016 0900   LDLCALC 126 (H) 07/25/2018 0949    CBC    Component Value Date/Time   WBC 7.3 07/09/2019 1247   WBC 11.1 (H) 06/26/2019 2305   RBC 2.19 (L) 07/09/2019 1247   HGB 6.5 (LL) 07/09/2019 1247   HGB 14.4 10/10/2017 1504   HCT  19.6 (L) 07/09/2019 1247   HCT 39.8 02/25/2018 0231   PLT 37 (L) 07/09/2019 1247   PLT 274 10/10/2017 1504   MCV 89.5 07/09/2019 1247   MCV 83 10/10/2017 1504   MCH 29.7 07/09/2019 1247   MCHC 33.2 07/09/2019 1247   RDW 18.8 (H) 07/09/2019 1247   RDW 13.6 10/10/2017 1504   LYMPHSABS 0.3 (L) 07/09/2019 1247   LYMPHSABS 3.2 (H) 10/10/2017 1504   MONOABS 0.9 07/09/2019 1247   EOSABS 0.1 07/09/2019 1247   EOSABS 0.1 10/10/2017 1504   BASOSABS 0.0 07/09/2019 1247   BASOSABS 0.0 10/10/2017 1504    Lab Results  Component Value Date   HGBA1C 5.2 (A) 10/01/2019    Assessment & Plan:   1. Type 2 diabetes mellitus without complication, without long-term current use of insulin (HCC) Diet controlled with A1c of 5.2 - HgB A1c  2. Other insomnia Currently uncontrolled on Ambien Advised I will be unable to increase his Ambien dose given he is on multiple controlled medications which can  Contribute to respiratory depression  3. Lumbar disc herniation with radiculopathy Stable On Fentanyl patch as per palliative care  4. Primary malignant neoplasm of lung metastatic to other site, unspecified laterality (  HCC) With bony metastasis and visceral metastasis s/p chemotherapy and radiation with metastasis to the spine, liver and suspicious pleura s/p palliative radiation. Arranged for PCS services, rollator precribed Followed by Oncology at [facility]  5. CAD S/P multiple PCI's Asymptomatic Continue statin, risk factor modification  6. Weight loss Secondary to poor oral intake On megestrol   No orders of the defined types were placed in this encounter.   Follow-up: Return in about 3 months (around 01/01/2020) for Medical conditions.       [name], MD, FAAFP. [facility] [place], [place] [phone]   10/01/2019, 5:26 PM

## 2019-10-01 NOTE — Patient Instructions (Signed)

## 2019-10-01 NOTE — Progress Notes (Signed)
Patient is requesting increase in Ambien.

## 2019-10-02 ENCOUNTER — Other Ambulatory Visit: Payer: Self-pay

## 2019-10-02 DIAGNOSIS — M5116 Intervertebral disc disorders with radiculopathy, lumbar region: Secondary | ICD-10-CM

## 2019-10-02 MED ORDER — MISC. DEVICES MISC: 0 refills | Status: AC

## 2019-10-03 DIAGNOSIS — E860 Dehydration: Secondary | ICD-10-CM | POA: Diagnosis not present

## 2019-10-03 DIAGNOSIS — N179 Acute kidney failure, unspecified: Secondary | ICD-10-CM | POA: Diagnosis not present

## 2019-10-03 DIAGNOSIS — C3411 Malignant neoplasm of upper lobe, right bronchus or lung: Secondary | ICD-10-CM | POA: Diagnosis not present

## 2019-10-10 ENCOUNTER — Other Ambulatory Visit: Payer: Self-pay | Admitting: Family Medicine

## 2019-10-10 DIAGNOSIS — G4709 Other insomnia: Secondary | ICD-10-CM

## 2019-10-12 MED FILL — ZOLPIDEM TARTRATE 5 MG TAB: 5 | 30 days supply | Qty: 30 | Fill #0

## 2019-10-16 ENCOUNTER — Telehealth: Payer: Self-pay | Admitting: Hospice

## 2019-10-16 NOTE — Telephone Encounter (Signed)
Called patient and left a voicemail with call back number: to schedule a palliative visit to evaluate patient's status.

## 2019-10-24 MED FILL — LORazepam 0.5 MG TABS: 0.5 | 5 days supply | Qty: 15 | Fill #0

## 2019-10-25 ENCOUNTER — Telehealth: Payer: Self-pay

## 2019-10-25 DIAGNOSIS — R531 Weakness: Secondary | ICD-10-CM | POA: Diagnosis not present

## 2019-10-25 DIAGNOSIS — G893 Neoplasm related pain (acute) (chronic): Secondary | ICD-10-CM | POA: Diagnosis not present

## 2019-10-25 DIAGNOSIS — C3490 Malignant neoplasm of unspecified part of unspecified bronchus or lung: Secondary | ICD-10-CM | POA: Diagnosis not present

## 2019-10-25 DIAGNOSIS — F418 Other specified anxiety disorders: Secondary | ICD-10-CM | POA: Diagnosis not present

## 2019-10-25 DIAGNOSIS — N179 Acute kidney failure, unspecified: Secondary | ICD-10-CM | POA: Diagnosis not present

## 2019-10-25 DIAGNOSIS — C7951 Secondary malignant neoplasm of bone: Secondary | ICD-10-CM | POA: Diagnosis not present

## 2019-10-25 DIAGNOSIS — M545 Low back pain: Secondary | ICD-10-CM | POA: Diagnosis not present

## 2019-10-25 DIAGNOSIS — C3491 Malignant neoplasm of unspecified part of right bronchus or lung: Secondary | ICD-10-CM | POA: Diagnosis not present

## 2019-10-25 DIAGNOSIS — C787 Secondary malignant neoplasm of liver and intrahepatic bile duct: Secondary | ICD-10-CM | POA: Diagnosis not present

## 2019-10-25 DIAGNOSIS — Z515 Encounter for palliative care: Secondary | ICD-10-CM | POA: Diagnosis not present

## 2019-10-25 DIAGNOSIS — D61810 Antineoplastic chemotherapy induced pancytopenia: Secondary | ICD-10-CM | POA: Diagnosis not present

## 2019-10-25 DIAGNOSIS — C782 Secondary malignant neoplasm of pleura: Secondary | ICD-10-CM | POA: Diagnosis not present

## 2019-10-25 DIAGNOSIS — G4700 Insomnia, unspecified: Secondary | ICD-10-CM | POA: Diagnosis not present

## 2019-10-25 NOTE — Telephone Encounter (Signed)
Called patient ot inform him to call his medical oncologist for refill on his megestrol. He is no longer follow ed at this clinic. Patient verbalized understanding.

## 2019-11-01 ENCOUNTER — Telehealth: Payer: Self-pay

## 2019-11-01 NOTE — Telephone Encounter (Signed)
Pt. Called and would like to speak to his nurse. Pt. Did not want to disclose his medical concerns and prefer to have the nurse call him back.

## 2019-11-02 MED FILL — MEGESTROL 20 MG TABLET: 20 | 30 days supply | Qty: 30 | Fill #1

## 2019-11-09 DIAGNOSIS — C3411 Malignant neoplasm of upper lobe, right bronchus or lung: Secondary | ICD-10-CM | POA: Diagnosis not present

## 2019-11-09 DIAGNOSIS — Z452 Encounter for adjustment and management of vascular access device: Secondary | ICD-10-CM | POA: Diagnosis not present

## 2019-11-13 ENCOUNTER — Emergency Department (HOSPITAL_COMMUNITY)
Admission: EM | Admit: 2019-11-13 | Discharge: 2019-11-13 | Disposition: A | Discharge status: Home / Self Care | Payer: Medicare Other | Attending: Emergency Medicine | Admitting: Emergency Medicine

## 2019-11-13 ENCOUNTER — Encounter (HOSPITAL_COMMUNITY): Payer: Self-pay | Admitting: *Deleted

## 2019-11-13 ENCOUNTER — Other Ambulatory Visit: Payer: Self-pay

## 2019-11-13 DIAGNOSIS — E1151 Type 2 diabetes mellitus with diabetic peripheral angiopathy without gangrene: Secondary | ICD-10-CM | POA: Insufficient documentation

## 2019-11-13 DIAGNOSIS — J449 Chronic obstructive pulmonary disease, unspecified: Secondary | ICD-10-CM | POA: Diagnosis not present

## 2019-11-13 DIAGNOSIS — K644 Residual hemorrhoidal skin tags: Secondary | ICD-10-CM

## 2019-11-13 DIAGNOSIS — I129 Hypertensive chronic kidney disease with stage 1 through stage 4 chronic kidney disease, or unspecified chronic kidney disease: Secondary | ICD-10-CM | POA: Insufficient documentation

## 2019-11-13 DIAGNOSIS — Z87891 Personal history of nicotine dependence: Secondary | ICD-10-CM | POA: Diagnosis not present

## 2019-11-13 DIAGNOSIS — I259 Chronic ischemic heart disease, unspecified: Secondary | ICD-10-CM | POA: Insufficient documentation

## 2019-11-13 DIAGNOSIS — N183 Chronic kidney disease, stage 3 unspecified: Secondary | ICD-10-CM | POA: Diagnosis not present

## 2019-11-13 DIAGNOSIS — G893 Neoplasm related pain (acute) (chronic): Secondary | ICD-10-CM | POA: Diagnosis not present

## 2019-11-13 DIAGNOSIS — M2550 Pain in unspecified joint: Secondary | ICD-10-CM | POA: Diagnosis present

## 2019-11-13 DIAGNOSIS — C7951 Secondary malignant neoplasm of bone: Secondary | ICD-10-CM | POA: Diagnosis not present

## 2019-11-13 DIAGNOSIS — Z79899 Other long term (current) drug therapy: Secondary | ICD-10-CM | POA: Insufficient documentation

## 2019-11-13 DIAGNOSIS — E1122 Type 2 diabetes mellitus with diabetic chronic kidney disease: Secondary | ICD-10-CM | POA: Diagnosis not present

## 2019-11-13 MED ORDER — KETOROLAC TROMETHAMINE 15 MG/ML IJ SOLN
7.5000 mg | Freq: Once | INTRAMUSCULAR | Status: AC
  Administered 2019-11-13: 7.5 mg via INTRAVENOUS
  Filled 2019-11-13: qty 1

## 2019-11-13 MED ORDER — LIDOCAINE HCL URETHRAL/MUCOSAL 2 % EX GEL
1.0000 "application " | Freq: Once | CUTANEOUS | Status: AC
  Administered 2019-11-13: 1 via TOPICAL
  Filled 2019-11-13: qty 5

## 2019-11-13 MED ORDER — DOCUSATE SODIUM 100 MG PO CAPS: 100.0000 mg | ORAL_CAPSULE | Freq: Two times a day (BID) | ORAL | 0 refills | Status: AC | PRN

## 2019-11-13 MED ORDER — METAMUCIL SMOOTH TEXTURE 58.6 % PO POWD: 1.0000 | Freq: Three times a day (TID) | ORAL | 12 refills | Status: AC | PRN

## 2019-11-13 MED ORDER — HYDROMORPHONE HCL 1 MG/ML IJ SOLN
1.0000 mg | Freq: Once | INTRAMUSCULAR | Status: AC
  Administered 2019-11-13: 1 mg via INTRAVENOUS
  Filled 2019-11-13: qty 1

## 2019-11-13 MED ORDER — KETAMINE HCL 50 MG/5ML IJ SOSY
0.3000 mg/kg | PREFILLED_SYRINGE | Freq: Once | INTRAMUSCULAR | Status: AC
  Administered 2019-11-13: 23 mg via INTRAVENOUS
  Filled 2019-11-13: qty 5

## 2019-11-13 MED ORDER — LACTATED RINGERS IV BOLUS
500.0000 mL | Freq: Once | INTRAVENOUS | Status: AC
  Administered 2019-11-13: 22:00:00 500 mL via INTRAVENOUS

## 2019-11-13 NOTE — ED Triage Notes (Signed)
Pt has stage 4 cancer, hurting all over, also has hemorrhoids that are causing him increased pain. Goes to [facility] for treatment

## 2019-11-13 NOTE — ED Provider Notes (Signed)
[facility] DEPT Provider Note   CSN: [id number] Arrival date & time: 11/13/19  1800     History Chief Complaint  Patient presents with  . Joint Pain  . Hemorrhoids    Andrew West is a 53 y.o. male.  HPI   53 year old male with diffuse body pain.  He has widely metastatic cancer.  Is complaining of pain in her shoulders, arms, back and hips.  Is also complaining of painful hemorrhoids.  Some mild bleeding at times.  He is having bowel movements.  Pain is significantly worse when he does.  He has not been getting great pain relief with his prescribed pain medicines.  No fevers or chills.  No acute respiratory complaints.  Past Medical History:  Diagnosis Date  . Anxiety   . CAD (coronary artery disease)    a. s/p multiple PCIs with last cath 11/2016 with severe multivessel CAD, s/p PCTA to LCx but unable to pass stent  . Chronic leg pain    bilateral  . Chronic lower back pain   . COPD (chronic obstructive pulmonary disease) ([place])   . Depression   . GERD (gastroesophageal reflux disease)    Takes Dexilant  . HLD (hyperlipidemia)   . Hypertension   . 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 Rhabdomyolysis    h/o, r/t statins  . Sleep apnea    "can't tolerate mask" (12/16/2016)  . Type II diabetes mellitus [facility])     Patient Active Problem List   Diagnosis Date Noted  . Chemotherapy-induced thrombocytopenia 06/05/2019  . Neutropenic fever ([place]) 05/23/2019  . Bone metastases ([place]) 05/10/2019  . Pain from bone metastases ([place]) 05/10/2019  . Port-A-Cath in place 05/07/2019  . Protein-calorie malnutrition, severe 12/26/2018  . Symptomatic anemia 12/25/2018  . Syncope 12/25/2018  . CKD (chronic kidney disease) stage 3, GFR 30-59 ml/min 12/09/2018  . Radiation esophagitis   . Malnutrition of moderate degree 12/06/2018  . Esophageal stricture   . N&V (nausea and vomiting) 12/01/2018  .  Antineoplastic chemotherapy induced pancytopenia ([place]) 12/01/2018  . Odynophagia   . Dysphagia 10/08/2018  . Hypokalemia 10/08/2018  . Hypomagnesemia 10/08/2018  . Type 2 diabetes mellitus ([place]) 10/08/2018  . Anemia 10/08/2018  . Sleep apnea 10/08/2018  . 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 Goals of care, counseling/discussion 08/31/2018  . Encounter for antineoplastic chemotherapy 08/31/2018  . Encounter for smoking cessation counseling 08/31/2018  . Malignant neoplasm of bronchus of right upper lobe ([place]) 08/29/2018  . Mediastinal mass 08/17/2018  . Vitamin D deficiency 08/04/2018  . Diabetic peripheral neuropathy ([place]) 06/30/2018  . Substernal chest pain 02/25/2018  . Benign prostatic hyperplasia with urinary hesitancy 11/17/2017  . Preoperative cardiovascular examination 07/12/2017  . Refractory angina ([place]) 05/24/2017  . Complex regional pain syndrome type I 03/24/2017  . Progressive angina ([place]) -Class III 12/16/2016  . Anxiety 06/28/2016  . Diastolic dysfunction-grade 2 with EF 60-65% Oct 2016 03/22/2016  . Hypertension 10/03/2015  . CAD S/P multiple PCI's 10/03/2015  . Pulmonary nodule 06/24/2015  . Cough 06/24/2015  . COPD with asthma ([place]) 06/24/2015  . Reactive airway disease 06/04/2015  . DOE (dyspnea on exertion) 04/15/2015  . Chronic low back pain 02/25/2014  . Lumbar disc herniation with radiculopathy 03/31/2012  . Presence of stent in left circumflex coronary artery 10/18/2011    Class: History of  . TOBACCO ABUSE  02/27/2009  . ABDOMINAL PAIN, LEFT LOWER QUADRANT 12/30/2008  . ABDOMINAL PAIN, EPIGASTRIC 12/05/2008  . Anxiety and depression 09/05/2008  . Hereditary and idiopathic peripheral neuropathy 09/05/2008  . GERD 09/05/2008  . Cervical disc disorder with radiculopathy of cervical region 08/19/2008  . HLD (hyperlipidemia) 04/25/2008    Class: Diagnosis of  . ALLERGIC RHINITIS 04/25/2008  . Backache 04/25/2008  . Chest  pain, unspecified 04/25/2008  . COLONIC POLYPS, HX OF 04/25/2008  . Obesity 04/18/2008    Class: Diagnosis of  . ANAL FISSURE, HX OF 04/18/2008  . Diabetes mellitus ([place]) 01/30/2008    Class: History of  . RECTAL BLEEDING 01/30/2008    Past Surgical History:  Procedure Laterality Date  . BACK SURGERY    . BRONCHIAL BIOPSY  08/25/2018   Procedure: BRONCHIAL BIOPSIES;  Surgeon: Garner Nash, DO;  Location: [facility];  Service: Cardiopulmonary;;  . CARDIAC CATHETERIZATION N/A 09/25/2015   Procedure: Left Heart Cath and Coronary Angiography;  Surgeon: Leonie Man, MD;  Location: [facility];  Service: Cardiovascular;  Laterality: N/A;  . CARDIAC CATHETERIZATION N/A 12/16/2016   Procedure: Left Heart Cath and Coronary Angiography;  Surgeon: Leonie Man, MD;  Location: [facility];  Service: Cardiovascular;  Laterality: N/A;  . CARDIAC CATHETERIZATION N/A 12/16/2016   Procedure: Coronary Balloon Angioplasty;  Surgeon: Leonie Man, MD;  Location: [facility];  Service: Cardiovascular;  Laterality: N/A;  . COLONOSCOPY W/ POLYPECTOMY    . CORONARY ANGIOPLASTY  09/25/2015   mid cir & om  . CORONARY ANGIOPLASTY WITH STENT PLACEMENT  10/09/2001   PTCA & stenting of mid AV circumflex; 2.5x6m Pixel stent  . CORONARY ANGIOPLASTY WITH STENT PLACEMENT  12/13/2001   PCI with stent to mid L circumflex, 95% stenosis to 0% residual  . CORONARY ANGIOPLASTY WITH STENT PLACEMENT  10/10/2003   PCI to mid AV circumflex; LAD 30% disease; RCA 100% occluded prox.  . CORONARY ANGIOPLASTY WITH STENT PLACEMENT  09/01/2011   PCI with stenting with bare metal stent to mid AV groove circumflex and PDA  . CORONARY ANGIOPLASTY WITH STENT PLACEMENT  10/17/2011   cutting balloon angioplasty of ostial lateral OM1 branch and bifurcation AV groove circumflex OM junction; stenosis reduced to 0%  . ENDOBRONCHIAL ULTRASOUND Bilateral 08/25/2018   Procedure: ENDOBRONCHIAL ULTRASOUND;   Surgeon: IGarner Nash DO;  Location: [facility];  Service: Cardiopulmonary;  Laterality: Bilateral;  . ESOPHAGOGASTRODUODENOSCOPY (EGD) WITH PROPOFOL N/A 12/08/2018   Procedure: ESOPHAGOGASTRODUODENOSCOPY (EGD) WITH PROPOFOL;  Surgeon: JMilus Banister MD;  Location: [facility];  Service: Endoscopy;  Laterality: N/A;  . EXCISIONAL HEMORRHOIDECTOMY    . FINE NEEDLE ASPIRATION  08/25/2018   Procedure: FINE NEEDLE ASPIRATION;  Surgeon: IGarner Nash DO;  Location: [facility];  Service: Cardiopulmonary;;  . FLEXIBLE BRONCHOSCOPY  08/25/2018   Procedure: FLEXIBLE BRONCHOSCOPY;  Surgeon: IGarner Nash DO;  Location: [facility];  Service: Cardiopulmonary;;  . IR GASTROSTOMY TUBE MOD SED  12/11/2018  . IR GASTROSTOMY TUBE MOD SED  05/17/2019  . IR GASTROSTOMY TUBE REMOVAL  03/22/2019  . IR GASTROSTOMY TUBE REMOVAL  07/31/2019  . IR IMAGING GUIDED PORT INSERTION  09/29/2018  . LEFT HEART CATHETERIZATION WITH CORONARY ANGIOGRAM N/A 10/18/2011   Procedure: LEFT HEART CATHETERIZATION WITH CORONARY ANGIOGRAM;  Surgeon: DLeonie Man MD;  [facility];  Service: Cardiovascular;  Laterality: N/A;  . LUMBAR LAMINECTOMY/DECOMPRESSION MICRODISCECTOMY  03/31/2012   Procedure: LUMBAR LAMINECTOMY/DECOMPRESSION MICRODISCECTOMY 1 LEVEL;  Surgeon:  Charlie Pitter, MD;  Location: [facility];  Service: Neurosurgery;  Laterality: Left;  . TRANSTHORACIC ECHOCARDIOGRAM  07/28/2011   EF 55-65%; LVH, grade 1 diastolic dysfunction;        Family History  Problem Relation Age of Onset  . Heart attack Father   . Hypertension Mother   . Diabetes Mother   . Heart disease Brother        x 3   . Heart attack Brother        deceased  . Hypertension Sister   . Diabetes Sister   . Anesthesia problems Neg Hx   . Hypotension Neg Hx   . Malignant hyperthermia Neg Hx   . Pseudochol deficiency Neg Hx     Social History   Tobacco Use  . Smoking status: Former Smoker    Packs/day: 0.25    Years:  25.00    Pack years: 6.25    Types: Cigarettes    Quit date: 05/24/2015    Years since quitting: 4.4  . Smokeless tobacco: Never Used  Substance Use Topics  . Alcohol use: No    Alcohol/week: 0.0 standard drinks  . Drug use: No    Home Medications Prior to Admission medications   Medication Sig Start Date End Date Taking? Authorizing Provider  acetaminophen (TYLENOL) 500 MG tablet Take 1,000 mg by mouth every 6 (six) hours as needed for moderate pain.   Yes [provider]  cetirizine (ZYRTEC) 10 MG tablet TAKE 1 TABLET(10 MG) BY MOUTH DAILY Patient taking differently: Take 10 mg by mouth daily.  09/26/19  Yes [name], MD  fentaNYL (DURAGESIC) 50 MCG/HR Place 1 patch onto the skin every 3 (three) days. 07/16/19  Yes Curt Bears, MD  lidocaine-prilocaine (EMLA) cream Apply 1 application topically as needed. Patient taking differently: Apply 1 application topically as needed (access).  06/05/19  Yes [name], [name], PA-C  LORazepam (ATIVAN) 0.5 MG tablet 1 tablet p.o. twice daily as needed for anxiety. Patient taking differently: Take 0.5 mg by mouth 2 (two) times daily as needed for anxiety.  07/03/19  Yes Curt Bears, MD  megestrol (MEGACE) 20 MG tablet TAKE 1 TABLET BY MOUTH ONCE DAILY Patient taking differently: Take 20 mg by mouth daily.  09/05/19  Yes [name], MD  methadone (DOLOPHINE) 5 MG tablet Take 5 mg by mouth at bedtime. 11/09/19  Yes [provider]  mirtazapine (REMERON) 30 MG tablet Take 1 tablet (30 mg total) by mouth at bedtime. 04/26/19  Yes Newlin, Charlane Ferretti, MD  nebivolol (BYSTOLIC) 5 MG tablet Take 1 tablet (5 mg total) by mouth daily. 03/19/19  Yes Hilty, Nadean Corwin, MD  nitroGLYCERIN (NITROSTAT) 0.4 MG SL tablet Place 1 tablet (0.4 mg total) under the tongue every 5 (five) minutes as needed for chest pain. 03/19/19  Yes Hilty, Nadean Corwin, MD  Oxycodone HCl 20 MG TABS Take 1 tablet by mouth every 4 (four) hours as needed for  pain. 11/08/19  Yes [provider]  pantoprazole (PROTONIX) 40 MG tablet Take 1 tablet (40 mg total) by mouth daily. 03/20/19  Yes [name], MD  prochlorperazine (COMPAZINE) 10 MG tablet Take 1 tablet (10 mg total) by mouth every 6 (six) hours as needed for nausea or vomiting. 07/03/19  Yes Curt Bears, MD  rosuvastatin (CRESTOR) 20 MG tablet Take 1 tablet (20 mg total) by mouth daily. 03/20/19  Yes Newlin, Charlane Ferretti, MD  sucralfate (CARAFATE) 1 g tablet Take 1 tablet by mouth 4 (four)  times daily -  before meals and at bedtime. 09/23/18  Yes [provider]  tiotropium (SPIRIVA HANDIHALER) 18 MCG inhalation capsule Place 1 capsule (18 mcg total) into inhaler and inhale daily. 03/20/19  Yes [name], MD  vitamin B-12 (CYANOCOBALAMIN) 500 MCG tablet Take 1 tablet (500 mcg total) by mouth daily. 06/30/18  Yes Jamse Arn, MD  XTAMPZA ER 27 MG C12A Take 54 mg by mouth every 8 (eight) hours. 10/26/19  Yes [provider]  zolpidem (AMBIEN) 5 MG tablet TAKE 1 TABLET BY MOUTH EVERY DAY AT BEDTIME Patient taking differently: Take 5 mg by mouth at bedtime.  10/22/19  Yes [name], MD  Accu-Chek FastClix Lancets MISC Use as directed to test blood sugar once daily. DX E11.9 05/23/19   [name], MD  Blood Glucose Monitoring Suppl (ACCU-CHEK GUIDE) w/Device KIT 1 each by Does not apply route daily. Use as directed to test blood sugar once daily. DX E11.9 05/23/19   [name], MD  glucose blood (ACCU-CHEK GUIDE) test strip Use as directed to test blood sugar once daily. DX E11.9 05/23/19   [name], MD  megestrol (MEGACE ES) 625 MG/5ML suspension Take 5 mLs (625 mg total) by mouth daily. Patient not taking: Reported on 11/13/2019 06/20/19   Kyung Rudd, MD  Misc. Devices MISC Walker with seat Weight:186lbs 10/02/19   [name], MD  oxyCODONE-acetaminophen (PERCOCET/ROXICET) 5-325 MG tablet Take 1 tablet by mouth every 6 (six) hours as needed for  severe pain. Patient not taking: Reported on 11/13/2019 07/16/19   Curt Bears, MD  ranolazine (RANEXA) 1000 MG SR tablet Take 1 tablet (1,000 mg total) by mouth 2 (two) times daily. Patient not taking: Reported on 07/03/2019 05/24/17   Pixie Casino, MD    Allergies    Iohexol  Review of Systems   Review of Systems All systems reviewed and negative, other than as noted in HPI.  Physical Exam Updated Vital Signs BP 116/83   Pulse 99   Temp 98.6 F (37 C) (Oral)   Resp 16   Ht _0  (1.753 m)   Wt 77.1 kg   SpO2 94%   BMI 25.10 kg/m   Physical Exam Vitals and nursing note reviewed.  Constitutional:      General: He is not in acute distress.    Appearance: He is well-developed.     Comments: Laying in bed.  Appears tired, but not toxic.  HENT:     Head: Normocephalic and atraumatic.  Eyes:     General:        Right eye: No discharge.        Left eye: No discharge.     Conjunctiva/sclera: Conjunctivae normal.  Cardiovascular:     Rate and Rhythm: Normal rate and regular rhythm.     Heart sounds: Normal heart sounds. No murmur. No friction rub. No gallop.   Pulmonary:     Effort: Pulmonary effort is normal. No respiratory distress.     Breath sounds: Normal breath sounds.  Abdominal:     General: There is no distension.     Palpations: Abdomen is soft.     Tenderness: There is no abdominal tenderness.  Genitourinary:    Comments: Multiple small nonthrombosed, nonbleeding external hemorrhoids. Musculoskeletal:        General: No tenderness.     Cervical back: Neck supple.  Skin:    General: Skin is warm and dry.  Neurological:     Mental Status: He  is alert.  Psychiatric:        Behavior: Behavior normal.        Thought Content: Thought content normal.     ED Results / Procedures / Treatments   Labs (all labs ordered are listed, but only abnormal results are displayed) Labs Reviewed - No data to display  EKG None  Radiology No results  found.  Procedures Procedures (including critical care time)  Medications Ordered in ED Medications  lidocaine (XYLOCAINE) 2 % jelly 1 application (has no administration in time range)  lactated ringers bolus 500 mL (500 mLs Intravenous New Bag/Given 11/13/19 2200)  ketorolac (TORADOL) 15 MG/ML injection 7.5 mg (7.5 mg Intravenous Given 11/13/19 2200)  HYDROmorphone (DILAUDID) injection 1 mg (1 mg Intravenous Given 11/13/19 2200)  ketamine 50 mg in normal saline 5 mL (10 mg/mL) syringe (23 mg Intravenous Given 11/13/19 2156)    ED Course  I have reviewed the triage vital signs and the nursing notes.  Pertinent labs & imaging results that were available during my care of the patient were reviewed by me and considered in my medical decision making (see chart for details).    MDM Rules/Calculators/A&P  53 year old male with diffuse body pain.  Most likely cancer related.  Is afebrile.  He appears somewhat uncomfortable, but nontoxic.  Plan symptomatic treatment.  We will try to get his pain under better control.  Feeling better. Cotinue anusol. Bulking agents. PRN lidocaine. Oncology or PCP follow-up with regards to possible adjustment of pain meds.   Final Clinical Impression(s) / ED Diagnoses Final diagnoses:  Cancer related pain  External hemorrhoids    Rx / DC Orders ED Discharge Orders    None       Virgel Manifold, MD 11/18/19 1041

## 2019-11-19 ENCOUNTER — Other Ambulatory Visit: Payer: Self-pay | Admitting: Family Medicine

## 2019-11-19 DIAGNOSIS — K219 Gastro-esophageal reflux disease without esophagitis: Secondary | ICD-10-CM

## 2019-11-19 MED FILL — PANTOPRAZOLE SOD DR 40 MG T: 40 | 90 days supply | Qty: 90 | Fill #0

## 2019-11-20 ENCOUNTER — Telehealth: Payer: Self-pay

## 2019-11-20 DIAGNOSIS — J449 Chronic obstructive pulmonary disease, unspecified: Secondary | ICD-10-CM

## 2019-11-20 NOTE — Telephone Encounter (Signed)
Patient is requesting medication for Hemorrhoids. Patient is requesting a inhaler. Send to : [facility] pharmacy.

## 2019-11-21 ENCOUNTER — Other Ambulatory Visit: Payer: Self-pay

## 2019-11-21 DIAGNOSIS — K219 Gastro-esophageal reflux disease without esophagitis: Secondary | ICD-10-CM

## 2019-11-21 MED ORDER — HYDROCORTISONE ACE-PRAMOXINE 1-1 % EX CREA: 1.0000 "application " | TOPICAL_CREAM | Freq: Two times a day (BID) | CUTANEOUS | 1 refills | Status: AC

## 2019-11-21 MED ORDER — SPIRIVA HANDIHALER 18 MCG IN CAPS: 18.0000 ug | ORAL_CAPSULE | Freq: Every day | RESPIRATORY_TRACT | 1 refills | Status: AC

## 2019-11-21 MED ORDER — PANTOPRAZOLE SODIUM 40 MG PO TBEC: 40.0000 mg | DELAYED_RELEASE_TABLET | Freq: Every day | ORAL | 1 refills | Status: AC

## 2019-11-21 MED FILL — HYDROCORT-PRAMOXINE 1%-1% C: 1-1 | 15 days supply | Qty: 30 | Fill #0

## 2019-11-21 NOTE — Telephone Encounter (Signed)
Patient was called and a voicemail was left informing patient that requested medications has been sent over to the pharmacy.

## 2019-11-21 NOTE — Telephone Encounter (Signed)
Done

## 2019-11-25 ENCOUNTER — Other Ambulatory Visit: Payer: Self-pay

## 2019-11-25 ENCOUNTER — Encounter (HOSPITAL_COMMUNITY): Payer: Self-pay | Admitting: Emergency Medicine

## 2019-11-25 ENCOUNTER — Other Ambulatory Visit: Payer: Self-pay | Admitting: Family Medicine

## 2019-11-25 ENCOUNTER — Emergency Department (HOSPITAL_COMMUNITY)
Admission: EM | Admit: 2019-11-25 | Discharge: 2019-11-25 | Disposition: A | Discharge status: Home / Self Care | Payer: Medicare Other | Attending: Emergency Medicine | Admitting: Emergency Medicine

## 2019-11-25 DIAGNOSIS — Z85118 Personal history of other malignant neoplasm of bronchus and lung: Secondary | ICD-10-CM | POA: Insufficient documentation

## 2019-11-25 DIAGNOSIS — E1122 Type 2 diabetes mellitus with diabetic chronic kidney disease: Secondary | ICD-10-CM | POA: Insufficient documentation

## 2019-11-25 DIAGNOSIS — N183 Chronic kidney disease, stage 3 unspecified: Secondary | ICD-10-CM | POA: Diagnosis not present

## 2019-11-25 DIAGNOSIS — M25512 Pain in left shoulder: Secondary | ICD-10-CM | POA: Diagnosis present

## 2019-11-25 DIAGNOSIS — J449 Chronic obstructive pulmonary disease, unspecified: Secondary | ICD-10-CM | POA: Insufficient documentation

## 2019-11-25 DIAGNOSIS — C7951 Secondary malignant neoplasm of bone: Secondary | ICD-10-CM | POA: Diagnosis not present

## 2019-11-25 DIAGNOSIS — Z955 Presence of coronary angioplasty implant and graft: Secondary | ICD-10-CM | POA: Diagnosis not present

## 2019-11-25 DIAGNOSIS — Z79899 Other long term (current) drug therapy: Secondary | ICD-10-CM | POA: Diagnosis not present

## 2019-11-25 DIAGNOSIS — Z9221 Personal history of antineoplastic chemotherapy: Secondary | ICD-10-CM | POA: Insufficient documentation

## 2019-11-25 DIAGNOSIS — Z8505 Personal history of malignant neoplasm of liver: Secondary | ICD-10-CM | POA: Diagnosis not present

## 2019-11-25 DIAGNOSIS — Z87891 Personal history of nicotine dependence: Secondary | ICD-10-CM | POA: Diagnosis not present

## 2019-11-25 DIAGNOSIS — C799 Secondary malignant neoplasm of unspecified site: Secondary | ICD-10-CM

## 2019-11-25 MED ORDER — KETOROLAC TROMETHAMINE 30 MG/ML IJ SOLN
30.0000 mg | Freq: Once | INTRAMUSCULAR | Status: AC
  Administered 2019-11-25: 30 mg via INTRAMUSCULAR
  Filled 2019-11-25: qty 1

## 2019-11-25 MED ORDER — LIDOCAINE 5 % EX PTCH
1.0000 | MEDICATED_PATCH | Freq: Once | CUTANEOUS | Status: DC
  Administered 2019-11-25: 1 via TRANSDERMAL
  Filled 2019-11-25: qty 1

## 2019-11-25 NOTE — Discharge Instructions (Addendum)
Take your usual medications for pain.  Follow-up with your cancer doctor and your primary care doctor as needed for problems.

## 2019-11-25 NOTE — ED Provider Notes (Signed)
[place] DEPT Provider Note   CSN: [id number] Arrival date & time: 11/25/19  1347     History Chief Complaint  Patient presents with  . pain control    Andrew West is a 54 y.o. male.  HPI Complains of generalized pain, primarily in his left shoulder, related to his metastatic cancer.  He takes chronic pain medications.  He was here 11/13/2019, and treated for pain.  Patient requests Dilaudid, "like they gave me before."  Has narcotic pain medicine was filled 12/17 and 11/09/2019.  He was given 1 month supply of each.  Databank reviewed.  He denies fever, chills, cough or shortness of breath.  There are no other known modifying factors.    Past Medical History:  Diagnosis Date  . Anxiety   . CAD (coronary artery disease)    a. s/p multiple PCIs with last cath 11/2016 with severe multivessel CAD, s/p PCTA to LCx but unable to pass stent  . Chronic leg pain    bilateral  . Chronic lower back pain   . COPD (chronic obstructive pulmonary disease) ([place])   . Depression   . GERD (gastroesophageal reflux disease)    Takes Dexilant  . HLD (hyperlipidemia)   . Hypertension   . 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 Rhabdomyolysis    h/o, r/t statins  . Sleep apnea    "can't tolerate mask" (12/16/2016)  . Type II diabetes mellitus [facility])     Patient Active Problem List   Diagnosis Date Noted  . Chemotherapy-induced thrombocytopenia 06/05/2019  . Neutropenic fever ([place]) 05/23/2019  . Bone metastases ([place]) 05/10/2019  . Pain from bone metastases ([place]) 05/10/2019  . Port-A-Cath in place 05/07/2019  . Protein-calorie malnutrition, severe 12/26/2018  . Symptomatic anemia 12/25/2018  . Syncope 12/25/2018  . CKD (chronic kidney disease) stage 3, GFR 30-59 ml/min 12/09/2018  . Radiation esophagitis   . Malnutrition of moderate degree 12/06/2018  . Esophageal stricture   . N&V (nausea and  vomiting) 12/01/2018  . Antineoplastic chemotherapy induced pancytopenia ([place]) 12/01/2018  . Odynophagia   . Dysphagia 10/08/2018  . Hypokalemia 10/08/2018  . Hypomagnesemia 10/08/2018  . Type 2 diabetes mellitus ([place]) 10/08/2018  . Anemia 10/08/2018  . Sleep apnea 10/08/2018  . 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 Goals of care, counseling/discussion 08/31/2018  . Encounter for antineoplastic chemotherapy 08/31/2018  . Encounter for smoking cessation counseling 08/31/2018  . Malignant neoplasm of bronchus of right upper lobe ([place]) 08/29/2018  . Mediastinal mass 08/17/2018  . Vitamin D deficiency 08/04/2018  . Diabetic peripheral neuropathy ([place]) 06/30/2018  . Substernal chest pain 02/25/2018  . Benign prostatic hyperplasia with urinary hesitancy 11/17/2017  . Preoperative cardiovascular examination 07/12/2017  . Refractory angina ([place]) 05/24/2017  . Complex regional pain syndrome type I 03/24/2017  . Progressive angina ([place]) -Class III 12/16/2016  . Anxiety 06/28/2016  . Diastolic dysfunction-grade 2 with EF 60-65% Oct 2016 03/22/2016  . Hypertension 10/03/2015  . CAD S/P multiple PCI's 10/03/2015  . Pulmonary nodule 06/24/2015  . Cough 06/24/2015  . COPD with asthma ([place]) 06/24/2015  . Reactive airway disease 06/04/2015  . DOE (dyspnea on exertion) 04/15/2015  . Chronic low back pain 02/25/2014  . Lumbar disc herniation with radiculopathy 03/31/2012  . Presence of stent in left circumflex coronary artery 10/18/2011    Class: History of  . TOBACCO ABUSE  02/27/2009  . ABDOMINAL PAIN, LEFT LOWER QUADRANT 12/30/2008  . ABDOMINAL PAIN, EPIGASTRIC 12/05/2008  . Anxiety and depression 09/05/2008  . Hereditary and idiopathic peripheral neuropathy 09/05/2008  . GERD 09/05/2008  . Cervical disc disorder with radiculopathy of cervical region 08/19/2008  . HLD (hyperlipidemia) 04/25/2008    Class: Diagnosis of  . ALLERGIC RHINITIS 04/25/2008  .  Backache 04/25/2008  . Chest pain, unspecified 04/25/2008  . COLONIC POLYPS, HX OF 04/25/2008  . Obesity 04/18/2008    Class: Diagnosis of  . ANAL FISSURE, HX OF 04/18/2008  . Diabetes mellitus ([place]) 01/30/2008    Class: History of  . RECTAL BLEEDING 01/30/2008    Past Surgical History:  Procedure Laterality Date  . BACK SURGERY    . BRONCHIAL BIOPSY  08/25/2018   Procedure: BRONCHIAL BIOPSIES;  Surgeon: Garner Nash, DO;  Location: [facility];  Service: Cardiopulmonary;;  . CARDIAC CATHETERIZATION N/A 09/25/2015   Procedure: Left Heart Cath and Coronary Angiography;  Surgeon: Leonie Man, MD;  Location: [address] CV LAB;  Service: Cardiovascular;  Laterality: N/A;  . CARDIAC CATHETERIZATION N/A 12/16/2016   Procedure: Left Heart Cath and Coronary Angiography;  Surgeon: Leonie Man, MD;  Location: [place] CV LAB;  Service: Cardiovascular;  Laterality: N/A;  . CARDIAC CATHETERIZATION N/A 12/16/2016   Procedure: Coronary Balloon Angioplasty;  Surgeon: Leonie Man, MD;  Location: [place] CV LAB;  Service: Cardiovascular;  Laterality: N/A;  . COLONOSCOPY W/ POLYPECTOMY    . CORONARY ANGIOPLASTY  09/25/2015   mid cir & om  . CORONARY ANGIOPLASTY WITH STENT PLACEMENT  10/09/2001   PTCA & stenting of mid AV circumflex; 2.5x3m Pixel stent  . CORONARY ANGIOPLASTY WITH STENT PLACEMENT  12/13/2001   PCI with stent to mid L circumflex, 95% stenosis to 0% residual  . CORONARY ANGIOPLASTY WITH STENT PLACEMENT  10/10/2003   PCI to mid AV circumflex; LAD 30% disease; RCA 100% occluded prox.  . CORONARY ANGIOPLASTY WITH STENT PLACEMENT  09/01/2011   PCI with stenting with bare metal stent to mid AV groove circumflex and PDA  . CORONARY ANGIOPLASTY WITH STENT PLACEMENT  10/17/2011   cutting balloon angioplasty of ostial lateral OM1 branch and bifurcation AV groove circumflex OM junction; stenosis reduced to 0%  . ENDOBRONCHIAL ULTRASOUND Bilateral 08/25/2018   Procedure:  ENDOBRONCHIAL ULTRASOUND;  Surgeon: IGarner Nash DO;  [facility];  Service: Cardiopulmonary;  Laterality: Bilateral;  . ESOPHAGOGASTRODUODENOSCOPY (EGD) WITH PROPOFOL N/A 12/08/2018   Procedure: ESOPHAGOGASTRODUODENOSCOPY (EGD) WITH PROPOFOL;  Surgeon: JMilus Banister MD;  [facility];  Service: Endoscopy;  Laterality: N/A;  . EXCISIONAL HEMORRHOIDECTOMY    . FINE NEEDLE ASPIRATION  08/25/2018   Procedure: FINE NEEDLE ASPIRATION;  Surgeon: IGarner Nash DO;  [facility];  Service: Cardiopulmonary;;  . FLEXIBLE BRONCHOSCOPY  08/25/2018   Procedure: FLEXIBLE BRONCHOSCOPY;  Surgeon: IGarner Nash DO;  [facility];  Service: Cardiopulmonary;;  . IR GASTROSTOMY TUBE MOD SED  12/11/2018  . IR GASTROSTOMY TUBE MOD SED  05/17/2019  . IR GASTROSTOMY TUBE REMOVAL  03/22/2019  . IR GASTROSTOMY TUBE REMOVAL  07/31/2019  . IR IMAGING GUIDED PORT INSERTION  09/29/2018  . LEFT HEART CATHETERIZATION WITH CORONARY ANGIOGRAM N/A 10/18/2011   Procedure: LEFT HEART CATHETERIZATION WITH CORONARY ANGIOGRAM;  Surgeon: DLeonie Man MD;  [facility];  Service: Cardiovascular;  Laterality: N/A;  . LUMBAR LAMINECTOMY/DECOMPRESSION MICRODISCECTOMY  03/31/2012   Procedure: LUMBAR LAMINECTOMY/DECOMPRESSION MICRODISCECTOMY 1 LEVEL;  Surgeon:  Charlie Pitter, MD;  Location: [place] NEURO ORS;  Service: Neurosurgery;  Laterality: Left;  . TRANSTHORACIC ECHOCARDIOGRAM  07/28/2011   EF 55-65%; LVH, grade 1 diastolic dysfunction;        Family History  Problem Relation Age of Onset  . Heart attack Father   . Hypertension Mother   . Diabetes Mother   . Heart disease Brother        x 3   . Heart attack Brother        deceased  . Hypertension Sister   . Diabetes Sister   . Anesthesia problems Neg Hx   . Hypotension Neg Hx   . Malignant hyperthermia Neg Hx   . Pseudochol deficiency Neg Hx     Social History   Tobacco Use  . Smoking status: Former Smoker     Packs/day: 0.25    Years: 25.00    Pack years: 6.25    Types: Cigarettes    Quit date: 05/24/2015    Years since quitting: 4.5  . Smokeless tobacco: Never Used  Substance Use Topics  . Alcohol use: No    Alcohol/week: 0.0 standard drinks  . Drug use: No    Home Medications Prior to Admission medications   Medication Sig Start Date End Date Taking? Authorizing Provider  Accu-Chek FastClix Lancets MISC Use as directed to test blood sugar once daily. DX E11.9 05/23/19   [name], MD  acetaminophen (TYLENOL) 500 MG tablet Take 1,000 mg by mouth every 6 (six) hours as needed for moderate pain.    [provider]  Blood Glucose Monitoring Suppl (ACCU-CHEK GUIDE) w/Device KIT 1 each by Does not apply route daily. Use as directed to test blood sugar once daily. DX E11.9 05/23/19   [name], MD  cetirizine (ZYRTEC) 10 MG tablet TAKE 1 TABLET(10 MG) BY MOUTH DAILY Patient taking differently: Take 10 mg by mouth daily.  09/26/19   [name], MD  docusate sodium (COLACE) 100 MG capsule Take 1 capsule (100 mg total) by mouth 2 (two) times daily as needed for mild constipation. 11/13/19   Virgel Manifold, MD  fentaNYL (DURAGESIC) 50 MCG/HR Place 1 patch onto the skin every 3 (three) days. 07/16/19   Curt Bears, MD  glucose blood (ACCU-CHEK GUIDE) test strip Use as directed to test blood sugar once daily. DX E11.9 05/23/19   [name], MD  lidocaine-prilocaine (EMLA) cream Apply 1 application topically as needed. Patient taking differently: Apply 1 application topically as needed (access).  06/05/19   [name], [name], PA-C  LORazepam (ATIVAN) 0.5 MG tablet 1 tablet p.o. twice daily as needed for anxiety. Patient taking differently: Take 0.5 mg by mouth 2 (two) times daily as needed for anxiety.  07/03/19   Curt Bears, MD  megestrol (MEGACE ES) 625 MG/5ML suspension Take 5 mLs (625 mg total) by mouth daily. Patient not taking: Reported on 11/13/2019 06/20/19    Kyung Rudd, MD  megestrol (MEGACE) 20 MG tablet TAKE 1 TABLET BY MOUTH ONCE DAILY Patient taking differently: Take 20 mg by mouth daily.  09/05/19   [name], MD  methadone (DOLOPHINE) 5 MG tablet Take 5 mg by mouth at bedtime. 11/09/19   [provider]  mirtazapine (REMERON) 30 MG tablet Take 1 tablet (30 mg total) by mouth at bedtime. 04/26/19   [name], MD  Misc. Devices MISC Walker with seat Weight:186lbs 10/02/19   [name], MD  nebivolol (BYSTOLIC) 5 MG tablet Take 1 tablet (5 mg total)  by mouth daily. 03/19/19   Hilty, Nadean Corwin, MD  nitroGLYCERIN (NITROSTAT) 0.4 MG SL tablet Place 1 tablet (0.4 mg total) under the tongue every 5 (five) minutes as needed for chest pain. 03/19/19   Hilty, Nadean Corwin, MD  Oxycodone HCl 20 MG TABS Take 1 tablet by mouth every 4 (four) hours as needed for pain. 11/08/19   [provider]  oxyCODONE-acetaminophen (PERCOCET/ROXICET) 5-325 MG tablet Take 1 tablet by mouth every 6 (six) hours as needed for severe pain. Patient not taking: Reported on 11/13/2019 07/16/19   Curt Bears, MD  pantoprazole (PROTONIX) 40 MG tablet Take 1 tablet (40 mg total) by mouth daily. 11/21/19   [name], MD  pramoxine-hydrocortisone [facility]) 1-1 % rectal cream Place 1 application rectally 2 (two) times daily. 11/21/19   [name], MD  prochlorperazine (COMPAZINE) 10 MG tablet Take 1 tablet (10 mg total) by mouth every 6 (six) hours as needed for nausea or vomiting. 07/03/19   Curt Bears, MD  psyllium (METAMUCIL SMOOTH TEXTURE) 58.6 % powder Take 1 packet by mouth 3 (three) times daily as needed. 11/13/19   Virgel Manifold, MD  ranolazine (RANEXA) 1000 MG SR tablet Take 1 tablet (1,000 mg total) by mouth 2 (two) times daily. Patient not taking: Reported on 07/03/2019 05/24/17   Pixie Casino, MD  rosuvastatin (CRESTOR) 20 MG tablet Take 1 tablet (20 mg total) by mouth daily. 03/20/19   [name], MD  sucralfate  (CARAFATE) 1 g tablet Take 1 tablet by mouth 4 (four) times daily -  before meals and at bedtime. 09/23/18   [provider]  tiotropium (SPIRIVA HANDIHALER) 18 MCG inhalation capsule Place 1 capsule (18 mcg total) into inhaler and inhale daily. 11/21/19   [name], MD  vitamin B-12 (CYANOCOBALAMIN) 500 MCG tablet Take 1 tablet (500 mcg total) by mouth daily. 06/30/18   Jamse Arn, MD  XTAMPZA ER 27 MG C12A Take 54 mg by mouth every 8 (eight) hours. 10/26/19   [provider]  zolpidem (AMBIEN) 5 MG tablet TAKE 1 TABLET BY MOUTH EVERY DAY AT BEDTIME Patient taking differently: Take 5 mg by mouth at bedtime.  10/22/19   [name], MD    Allergies    Iohexol  Review of Systems   Review of Systems  All other systems reviewed and are negative.   Physical Exam Updated Vital Signs BP 113/85 (BP Location: Right Arm)   Pulse 94   Temp 98.6 F (37 C) (Oral)   Resp 16   Ht _0  (1.753 m)   Wt 78 kg   SpO2 99%   BMI 25.40 kg/m   Physical Exam Vitals and nursing note reviewed.  Constitutional:      Appearance: He is well-developed.  HENT:     Head: Normocephalic and atraumatic.     Right Ear: External ear normal.     Left Ear: External ear normal.  Eyes:     Conjunctiva/sclera: Conjunctivae normal.     Pupils: Pupils are equal, round, and reactive to light.  Neck:     Trachea: Phonation normal.  Cardiovascular:     Rate and Rhythm: Normal rate and regular rhythm.     Heart sounds: Normal heart sounds.  Pulmonary:     Effort: Pulmonary effort is normal.     Breath sounds: Normal breath sounds.  Musculoskeletal:        General: No swelling.     Cervical back: Normal range of motion and neck  supple.  Skin:    General: Skin is warm and dry.  Neurological:     Mental Status: He is alert and oriented to person, place, and time.     Cranial Nerves: No cranial nerve deficit.     Sensory: No sensory deficit.     Motor: No abnormal muscle tone.      Coordination: Coordination normal.  Psychiatric:        Mood and Affect: Mood normal.        Behavior: Behavior normal.     ED Results / Procedures / Treatments   Labs (all labs ordered are listed, but only abnormal results are displayed) Labs Reviewed - No data to display  EKG None  Radiology No results found.  Procedures Procedures (including critical care time)  Medications Ordered in ED Medications  lidocaine (LIDODERM) 5 % 1-3 patch (1 patch Transdermal Patch Applied 11/25/19 1540)  ketorolac (TORADOL) 30 MG/ML injection 30 mg (30 mg Intramuscular Given 11/25/19 1540)    ED Course  I have reviewed the triage vital signs and the nursing notes.  Pertinent labs & imaging results that were available during my care of the patient were reviewed by me and considered in my medical decision making (see chart for details).    MDM Rules/Calculators/A&P                       Patient Vitals for the past 24 hrs:  BP Temp Temp src Pulse Resp SpO2 Height Weight  11/25/19 1412 113/85 98.6 F (37 C) Oral 94 16 99 % _0  (1.753 m) 78 kg      Medical Decision Making: Chronic pain, with known bony metastases.  Doubt significant exacerbation of cancer burden.  No indication for further ED evaluation or treatment at this time.  CRITICAL CARE-no Performed by: Daleen Bo   Nursing Notes Reviewed/ Care Coordinated Applicable Imaging Reviewed Interpretation of Laboratory Data incorporated into ED treatment  The patient appears reasonably screened and/or stabilized for discharge and I doubt any other medical condition or other [facility] requiring further screening, evaluation, or treatment in the ED at this time prior to discharge.  Plan: Home Medications-continue current; Home Treatments-rest, gradual advance activity; return here if the recommended treatment, does not improve the symptoms; Recommended follow up-PCP, as needed    Final Clinical Impression(s) / ED Diagnoses Final  diagnoses:  Metastatic malignant neoplasm, unspecified site [facility])    Rx / [place] Orders ED Discharge Orders    None       Daleen Bo, MD 11/25/19 1700

## 2019-11-25 NOTE — ED Triage Notes (Signed)
Per pt, states he has bone cancer-cant get his meds till [facility] for pain control

## 2019-11-26 ENCOUNTER — Other Ambulatory Visit: Payer: Self-pay | Admitting: Family Medicine

## 2019-11-26 DIAGNOSIS — G4709 Other insomnia: Secondary | ICD-10-CM

## 2019-11-26 MED FILL — LORazepam 0.5 MG TABS: 0.5 | 5 days supply | Qty: 15 | Fill #0

## 2019-11-26 MED FILL — ROSUVASTATIN CALCIUM 20 MG: 20 | 30 days supply | Qty: 30 | Fill #1

## 2019-11-26 MED FILL — PROCHLORPERAZINE 10 MG TAB: 10 | 8 days supply | Qty: 30 | Fill #0

## 2019-11-26 MED FILL — ISOSORBIDE MN 120 MG TAB SA: 120 | 30 days supply | Qty: 30 | Fill #1

## 2019-11-26 NOTE — Telephone Encounter (Signed)
Our pharmacy is requesting trazodone but pt fills Zolpidem at another pharmacy. Will send information to PCP.

## 2019-11-30 ENCOUNTER — Other Ambulatory Visit: Payer: Self-pay

## 2019-11-30 ENCOUNTER — Other Ambulatory Visit: Payer: Medicare Other | Admitting: Hospice

## 2019-11-30 DIAGNOSIS — Z515 Encounter for palliative care: Secondary | ICD-10-CM | POA: Diagnosis not present

## 2019-11-30 DIAGNOSIS — C3411 Malignant neoplasm of upper lobe, right bronchus or lung: Secondary | ICD-10-CM

## 2019-11-30 NOTE — Progress Notes (Signed)
Therapist, nutritional Palliative Care Consult Note Telephone: [phone]  Fax: [phone]  PATIENT NAME: Andrew West DOB: 26-Jul-1966 MRN: [id number]  PRIMARY CARE PROVIDER:   [name], MD  REFERRING PROVIDER: Arlis Porta MD RESPONSIBLE PARTY:   Self [phone]  TELEHEALTH VISIT STATEMENT Due to the COVID-19 crisis, this visit was done via telephone from my office. It was initiated and consented to by this patient and/or family.  RECOMMENDATIONS/PLAN:   Advance Care Planning/Goals of Care: Telehealth Visit consisted of building trust and discussions on Palliative Medicine as specialized medical care for people living with serious illness, aimed at facilitating better quality of life through symptoms relief, assisting with advance care plan and establishing goals of care. Patient affirmed he is a full code. Goals of care include to maximize quality of life and symptom management. Symptom management: Pain related to bone metastasis from Lung CA currently managed with Fentanyl patch and Oxycodone. He reported his pain is well managed with current regimen. He said he has appointment with his oncologist on 12/13/2019 at [facility] cancer center. No concerns at this time Follow up: Palliative care will continue to follow patient for goals of care clarification and symptom management. I spent 30 minutes providing this consultation,  from 1:30 to 2pm. More than 50% of the time in this consultation was spent coordinating communication.   HISTORY OF PRESENT ILLNESS:  Andrew West is a 54 y.o. year old male with multiple medical problems including small cell lung cancer of RUL with bone mets, CAD, DMT2, HTN, OSA, COPD. Palliative Care was asked to help address goals of care.   CODE STATUS: Full  PPS: 60% HOSPICE ELIGIBILITY/DIAGNOSIS: TBD  PAST MEDICAL HISTORY:  Past Medical History:  Diagnosis Date  . Anxiety   . CAD (coronary artery  disease)    a. s/p multiple PCIs with last cath 11/2016 with severe multivessel CAD, s/p PCTA to LCx but unable to pass stent  . Chronic leg pain    bilateral  . Chronic lower back pain   . COPD (chronic obstructive pulmonary disease) ([facility])   . Depression   . GERD (gastroesophageal reflux disease)    Takes Dexilant  . HLD (hyperlipidemia)   . Hypertension   . 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 Rhabdomyolysis    h/o, r/t statins  . Sleep apnea    "can't tolerate mask" (12/16/2016)  . Type II diabetes mellitus ([facility])     SOCIAL HX:  Social History   Tobacco Use  . Smoking status: Former Smoker    Packs/day: 0.25    Years: 25.00    Pack years: 6.25    Types: Cigarettes    Quit date: 05/24/2015    Years since quitting: 4.5  . Smokeless tobacco: Never Used  Substance Use Topics  . Alcohol use: No    Alcohol/week: 0.0 standard drinks    ALLERGIES:  Allergies  Allergen Reactions  . Iohexol Anaphylaxis    PT. TO BE PREMEDICATED PRIOR TO IV CONTRAST PER DR Eppie Gibson /MMS//12/15/15Desc: PT BECAME SOB AND CHEST TIGHTNESS AFTER CONTRAST INJECTION.  Ardis Hughs., Onset Date: [id number]      PERTINENT MEDICATIONS:  Outpatient Encounter Medications as of 11/30/2019  Medication Sig  . Accu-Chek FastClix Lancets MISC Use as directed to test blood sugar once daily. DX E11.9  . acetaminophen (TYLENOL) 500 MG tablet Take 1,000 mg by mouth  every 6 (six) hours as needed for moderate pain.  . Blood Glucose Monitoring Suppl (ACCU-CHEK GUIDE) w/Device KIT 1 each by Does not apply route daily. Use as directed to test blood sugar once daily. DX E11.9  . cetirizine (ZYRTEC) 10 MG tablet TAKE 1 TABLET(10 MG) BY MOUTH DAILY (Patient taking differently: Take 10 mg by mouth daily. )  . docusate sodium (COLACE) 100 MG capsule Take 1 capsule (100 mg total) by mouth 2 (two) times daily as needed for mild constipation.  . fentaNYL (DURAGESIC) 50  MCG/HR Place 1 patch onto the skin every 3 (three) days.  Marland Kitchen glucose blood (ACCU-CHEK GUIDE) test strip Use as directed to test blood sugar once daily. DX E11.9  . lidocaine-prilocaine (EMLA) cream Apply 1 application topically as needed. (Patient taking differently: Apply 1 application topically as needed (access). )  . LORazepam (ATIVAN) 0.5 MG tablet 1 tablet p.o. twice daily as needed for anxiety. (Patient taking differently: Take 0.5 mg by mouth 2 (two) times daily as needed for anxiety. )  . megestrol (MEGACE ES) 625 MG/5ML suspension Take 5 mLs (625 mg total) by mouth daily. (Patient not taking: Reported on 11/13/2019)  . megestrol (MEGACE) 20 MG tablet TAKE 1 TABLET BY MOUTH ONCE DAILY (Patient taking differently: Take 20 mg by mouth daily. )  . methadone (DOLOPHINE) 5 MG tablet Take 5 mg by mouth at bedtime.  . mirtazapine (REMERON) 30 MG tablet TAKE 1 TABLET(30 MG) BY MOUTH AT BEDTIME  . Misc. Devices MISC Walker with seat Weight:186lbs  . nebivolol (BYSTOLIC) 5 MG tablet Take 1 tablet (5 mg total) by mouth daily.  . nitroGLYCERIN (NITROSTAT) 0.4 MG SL tablet Place 1 tablet (0.4 mg total) under the tongue every 5 (five) minutes as needed for chest pain.  . Oxycodone HCl 20 MG TABS Take 1 tablet by mouth every 4 (four) hours as needed for pain.  Marland Kitchen oxyCODONE-acetaminophen (PERCOCET/ROXICET) 5-325 MG tablet Take 1 tablet by mouth every 6 (six) hours as needed for severe pain. (Patient not taking: Reported on 11/13/2019)  . pantoprazole (PROTONIX) 40 MG tablet Take 1 tablet (40 mg total) by mouth daily.  . pramoxine-hydrocortisone (ANALPRAM-HC) 1-1 % rectal cream Place 1 application rectally 2 (two) times daily.  . prochlorperazine (COMPAZINE) 10 MG tablet Take 1 tablet (10 mg total) by mouth every 6 (six) hours as needed for nausea or vomiting.  . psyllium (METAMUCIL SMOOTH TEXTURE) 58.6 % powder Take 1 packet by mouth 3 (three) times daily as needed.  . ranolazine (RANEXA) 1000 MG SR tablet  Take 1 tablet (1,000 mg total) by mouth 2 (two) times daily. (Patient not taking: Reported on 07/03/2019)  . rosuvastatin (CRESTOR) 20 MG tablet Take 1 tablet (20 mg total) by mouth daily.  . sucralfate (CARAFATE) 1 g tablet Take 1 tablet by mouth 4 (four) times daily -  before meals and at bedtime.  Marland Kitchen tiotropium (SPIRIVA HANDIHALER) 18 MCG inhalation capsule Place 1 capsule (18 mcg total) into inhaler and inhale daily.  . vitamin B-12 (CYANOCOBALAMIN) 500 MCG tablet Take 1 tablet (500 mcg total) by mouth daily.  Marland Kitchen XTAMPZA ER 27 MG C12A Take 54 mg by mouth every 8 (eight) hours.  Marland Kitchen zolpidem (AMBIEN) 5 MG tablet TAKE 1 TABLET BY MOUTH EVERY DAY AT BEDTIME (Patient taking differently: Take 5 mg by mouth at bedtime. )   No facility-administered encounter medications on file as of 11/30/2019.    Rosaura Carpenter, NP

## 2019-12-18 DIAGNOSIS — R0689 Other abnormalities of breathing: Secondary | ICD-10-CM | POA: Diagnosis not present

## 2019-12-18 DIAGNOSIS — I499 Cardiac arrhythmia, unspecified: Secondary | ICD-10-CM | POA: Diagnosis not present

## 2019-12-18 DIAGNOSIS — R0902 Hypoxemia: Secondary | ICD-10-CM | POA: Diagnosis not present

## 2019-12-18 DIAGNOSIS — I469 Cardiac arrest, cause unspecified: Secondary | ICD-10-CM | POA: Diagnosis not present

## 2019-12-24 DIAGNOSIS — 419620001 Death: Secondary | SNOMED CT | POA: Diagnosis not present

## 2019-12-24 DEATH — deceased

## 2019-12-28 NOTE — Telephone Encounter (Signed)
Opened in error

## 2020-06-02 IMAGING — XA IR FLUORO GUIDE CV LINE*L*
1 series · 1 of 1 positions shown · non-contrast
Comparison: none

INDICATION: 52-year-old male with a history of right hilar lung cancer

[Series 1: run · 0.06mm/px · 1 of 1 slices shown]
[im 1/1]
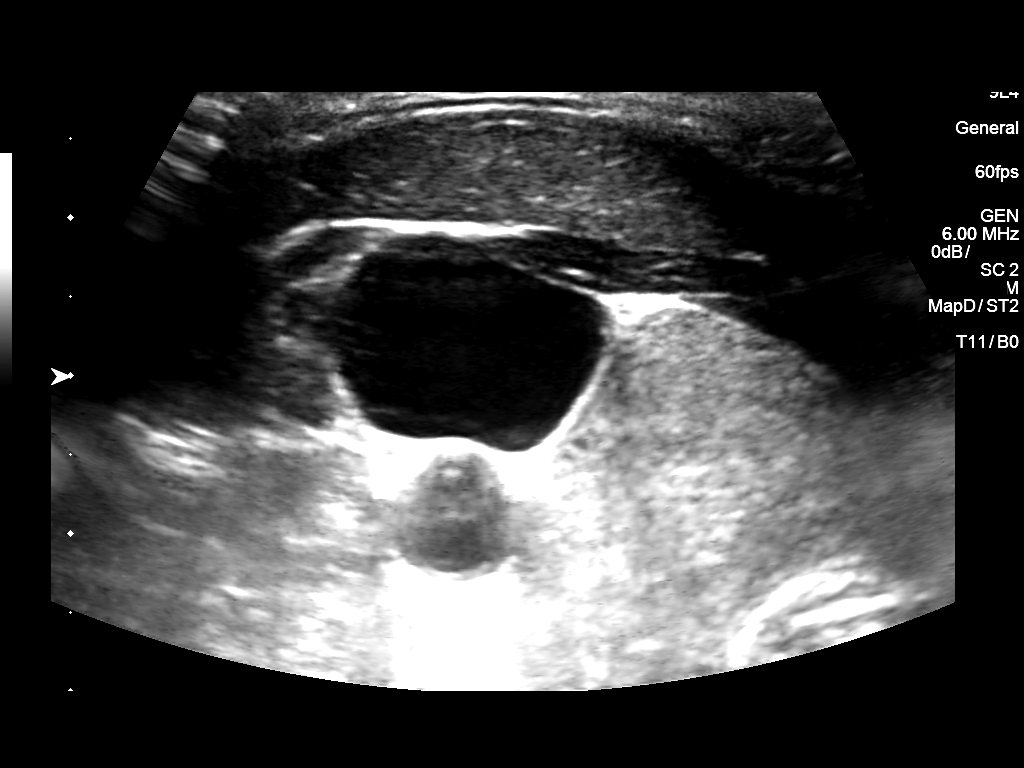

[1 of 1 positions shown; findings below may reference images not displayed]

EXAM:
IMPLANTED PORT A CATH PLACEMENT WITH ULTRASOUND AND FLUOROSCOPIC
GUIDANCE

MEDICATIONS:
2 g Ancef; The antibiotic was administered within an appropriate
time interval prior to skin puncture.

ANESTHESIA/SEDATION:
Moderate (conscious) sedation was employed during this procedure. A
total of Versed 4.0 mg and Fentanyl 100 mcg was administered
intravenously.

Moderate Sedation Time: 21 minutes. The patient's level of
consciousness and vital signs were monitored continuously by
radiology nursing throughout the procedure under my direct
supervision.

FLUOROSCOPY TIME:  0 minutes, 42 seconds (12.3 mGy)

COMPLICATIONS:
None

PROCEDURE:
The procedure, risks, benefits, and alternatives were explained to
the patient. Questions regarding the procedure were encouraged and
answered. The patient understands and consents to the procedure.

Ultrasound survey was performed with images stored and sent to PACs.

The left neck and chest was prepped with chlorhexidine, and draped
in the usual sterile fashion using maximum barrier technique (cap
and mask, sterile gown, sterile gloves, large sterile sheet, hand
hygiene and cutaneous antiseptic). Antibiotic prophylaxis was
provided with 2.0g Ancef administered IV one hour prior to skin
incision. Local anesthesia was attained by infiltration with 1%
lidocaine without epinephrine.

Ultrasound demonstrated patency of the left internal jugular vein,
and this was documented with an image. Under real-time ultrasound
guidance, this vein was accessed with a 21 gauge micropuncture
needle and image documentation was performed. A small dermatotomy
was made at the access site with an 11 scalpel. A 0.018" wire was
advanced into the SVC and used to estimate the length of the
internal catheter. The access needle exchanged for a 4F
micropuncture vascular sheath. The 0.018" wire was then removed and
a 0.035" wire advanced into the IVC.



The venous access site was then serially dilated and a peel away
vascular sheath placed over the wire. The wire was removed and the
port catheter advanced into position under fluoroscopic guidance.
The catheter tip is positioned in the cavoatrial junction. This was
documented with a spot image. The portacatheter was then tested and
found to flush and aspirate well. The port was flushed with saline
followed by 100 units/mL heparinized saline.

The pocket was then closed in two layers using first subdermal
inverted interrupted absorbable sutures followed by a running
subcuticular suture. The epidermis was then sealed with Dermabond.
The dermatotomy at the venous access site was also seal with
Dermabond.

Patient tolerated the procedure well and remained hemodynamically
stable throughout.

No complications encountered and no significant blood loss
encountered
IMPRESSION: Status post placement of left internal jugular port catheter.
Catheter ready for use.

## 2020-06-11 IMAGING — CR DG CHEST 2V
2 series · 2 of 2 positions shown · non-contrast
Comparison: 09/24/2018

CLINICAL DATA: Lung cancer, nausea, weakness, shortness of breath

EXAM:
CHEST - 2 VIEW

[w chest lat]
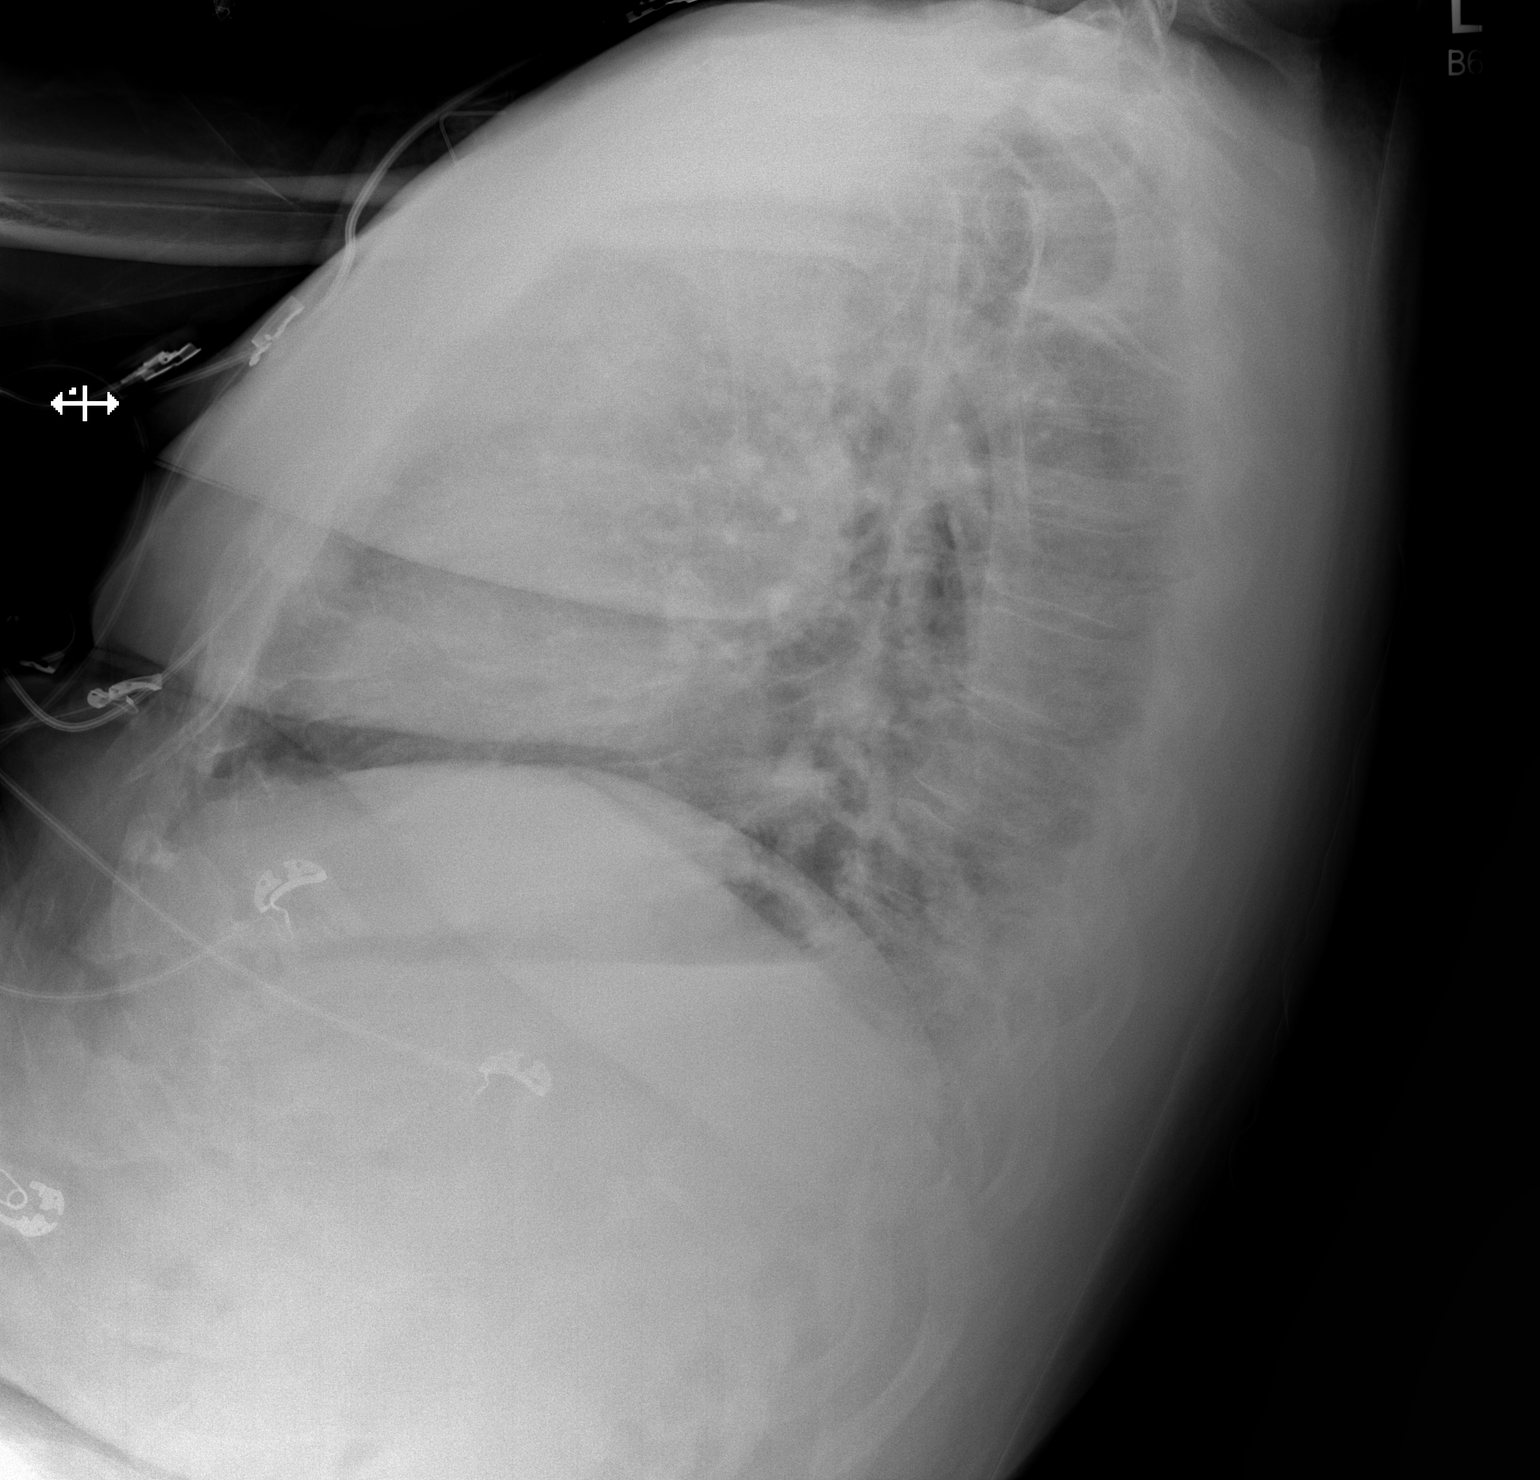

[x chest ap]
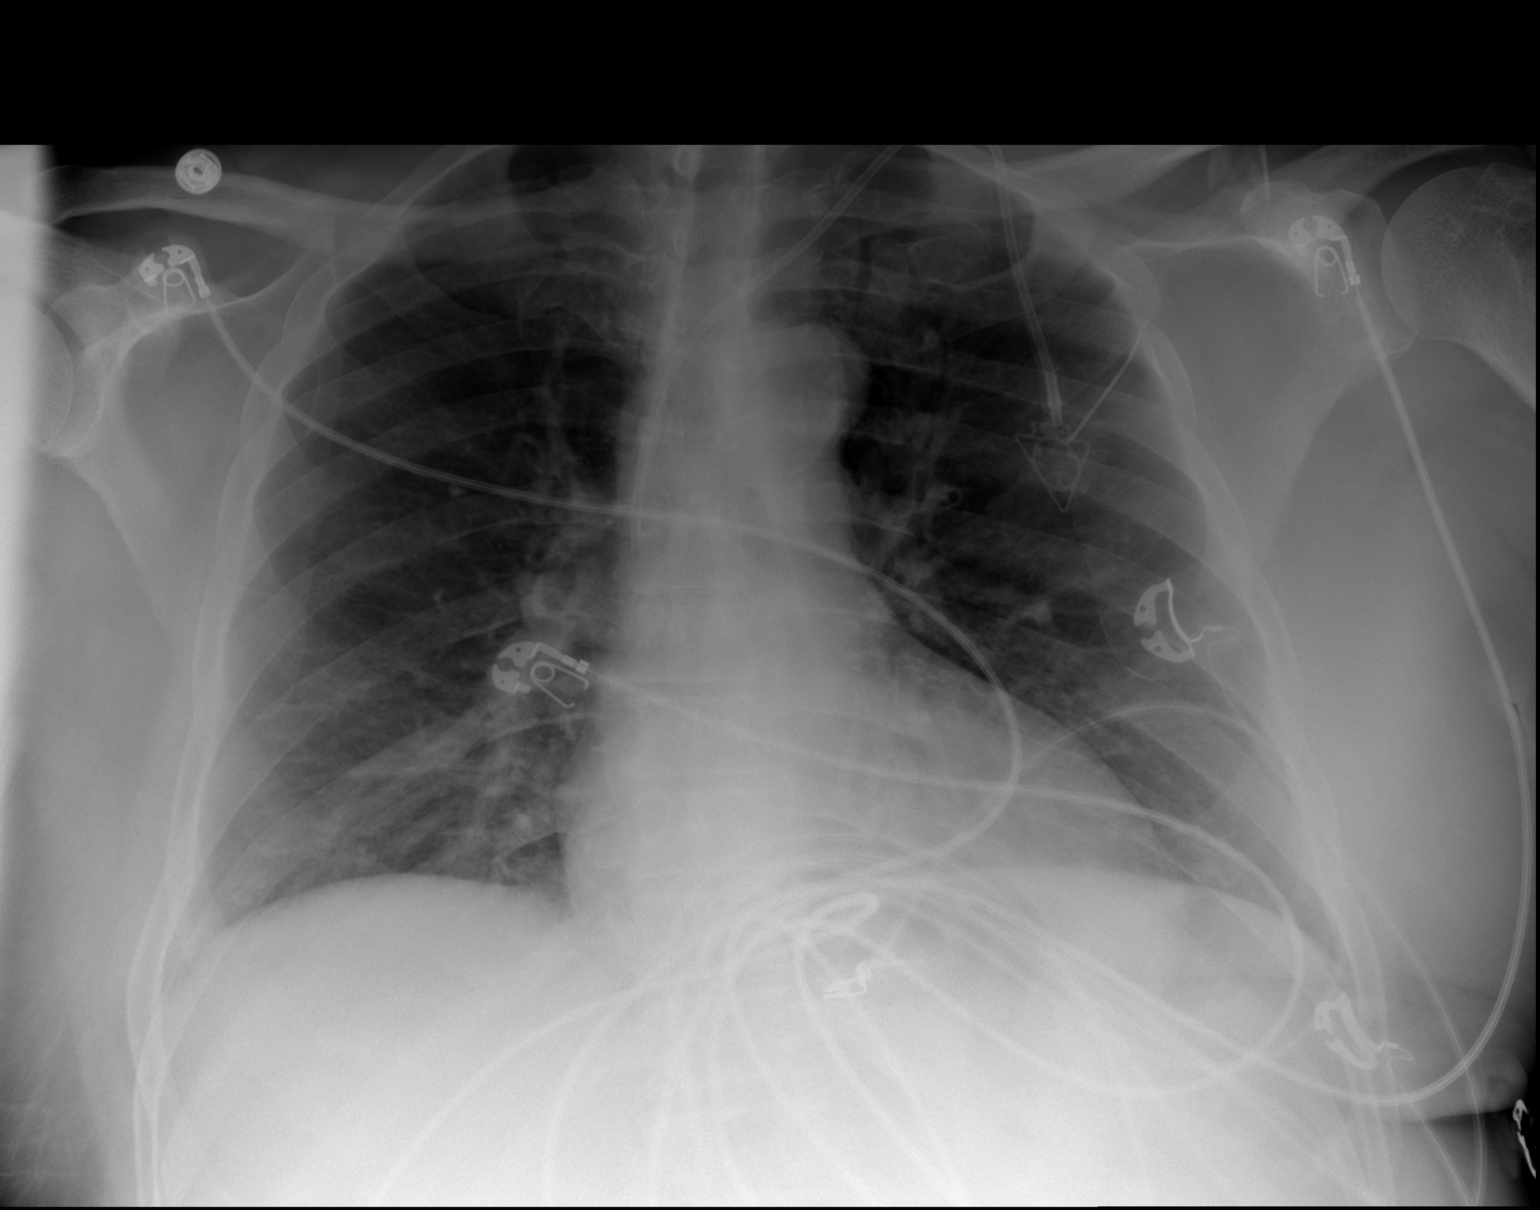

[2 of 2 positions shown; findings below may reference images not displayed]

FINDINGS: Lungs are clear. No pleural effusion or pneumothorax.

Prior mediastinal lymphadenopathy, particularly along the right
paratracheal stripe, appears improved.

The heart is normal in size.

Left chest power port terminates in the lower SVC.

Visualized osseous structures are within normal limits.
IMPRESSION: No evidence of acute cardiopulmonary disease.

## 2020-07-04 IMAGING — CT CT ABD-PELV W/O CM
2 of 4 series · 15 of 46 positions shown, 17 images · non-contrast
Comparison: PET-CT dated 08/21/2018

CLINICAL DATA: 52-year-old male with abdominal pain. Concern for
acute appendicitis. History of right lung mass.

EXAM:
CT ABDOMEN AND PELVIS WITHOUT CONTRAST
TECHNIQUE: Multidetector CT imaging of the abdomen and pelvis was performed
following the standard protocol without IV contrast.

[Series 2: axial st · axial · 0.71mm/px · z∈[-266,+159]mm · 12 of 97 slices shown, 14 images]
[im 6/97  soft-tissue]
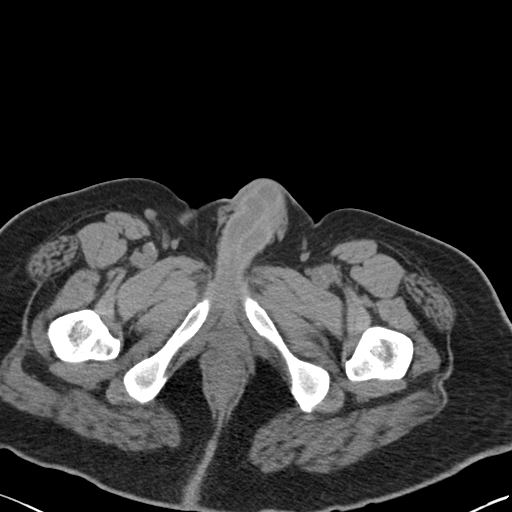
[im 6/97  bone]
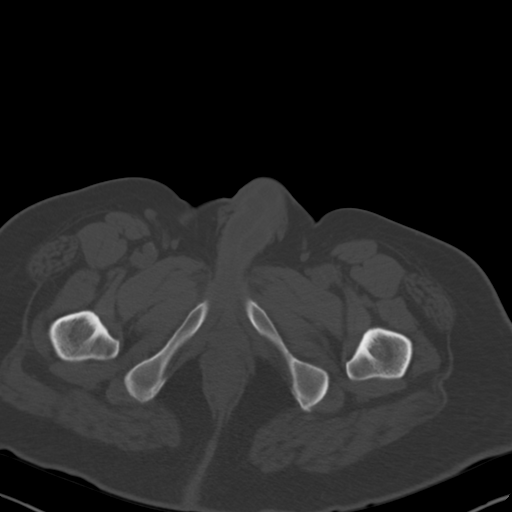
[im 17/97  soft-tissue]
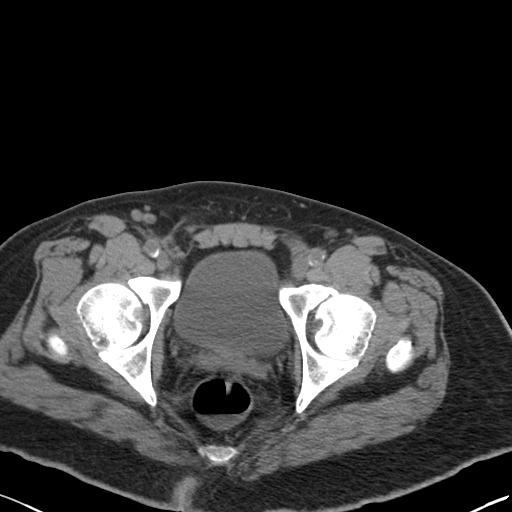
[im 22/97  soft-tissue]
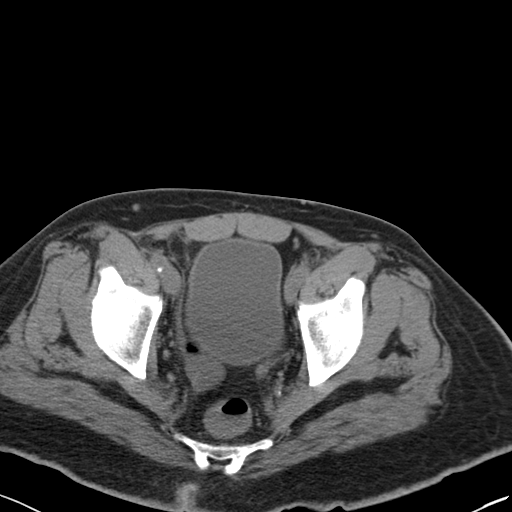
[im 27/97  soft-tissue]
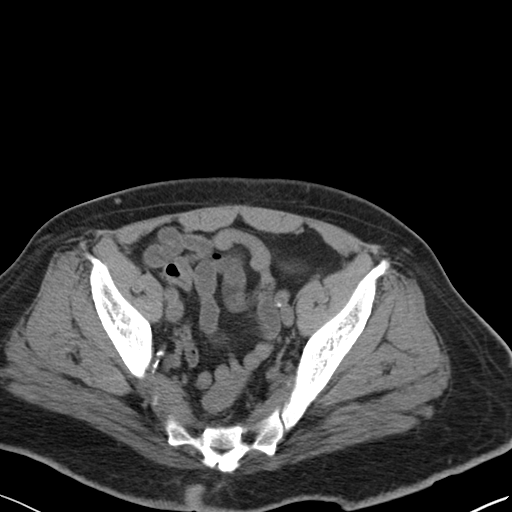
[im 38/97  soft-tissue]
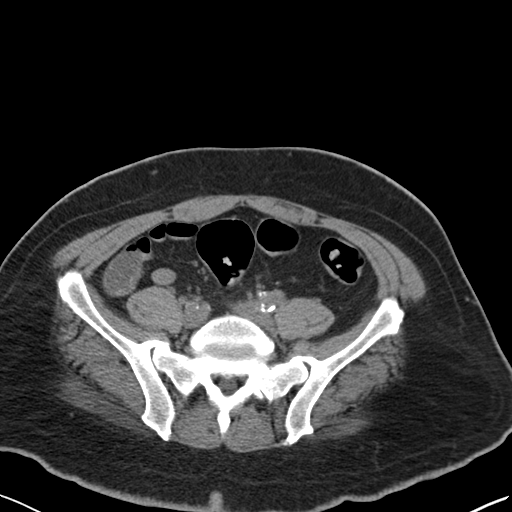
[im 43/97  soft-tissue]
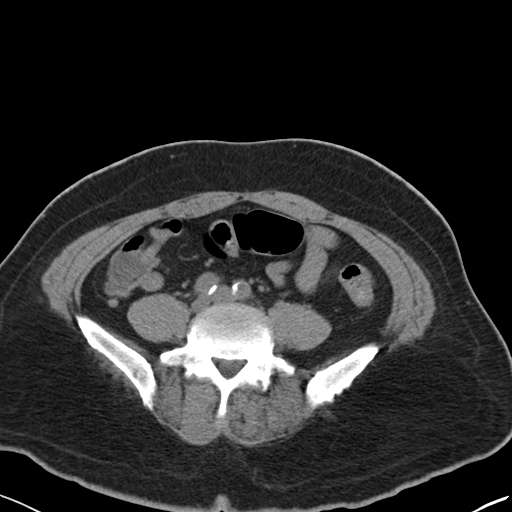
[im 54/97  soft-tissue]
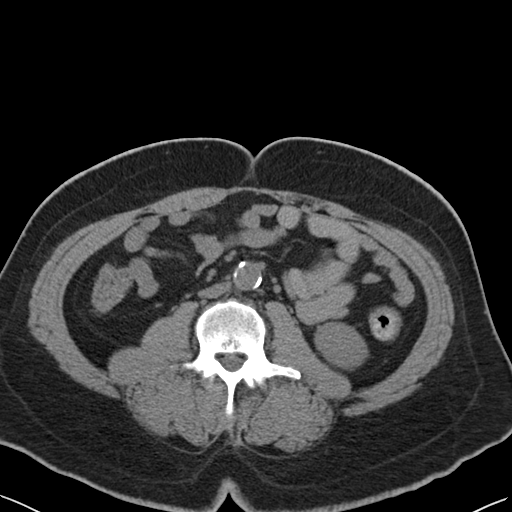
[im 59/97  soft-tissue]
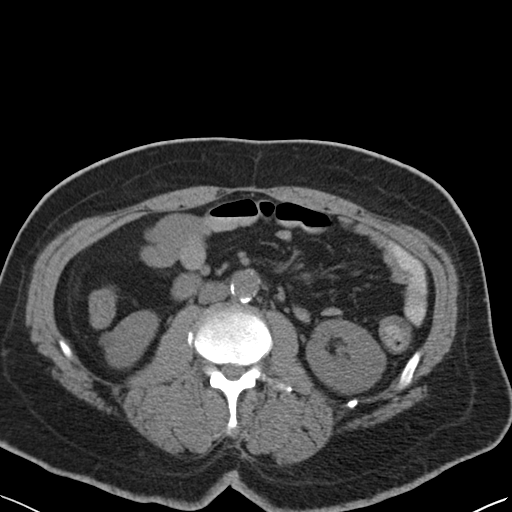
[im 70/97  soft-tissue]
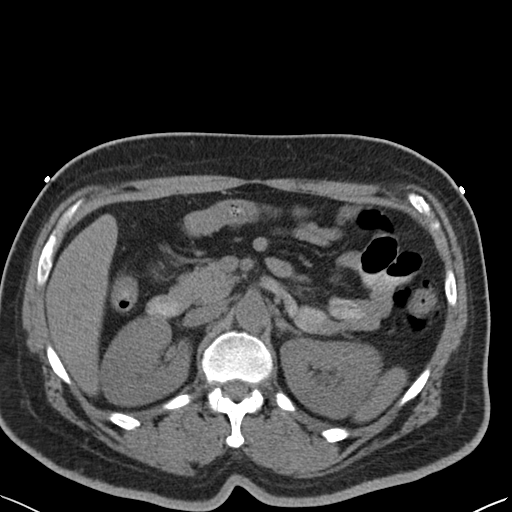
[im 70/97  bone]
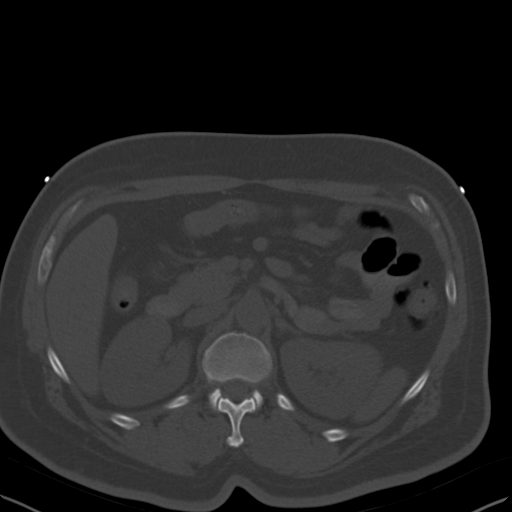
[im 75/97  soft-tissue]
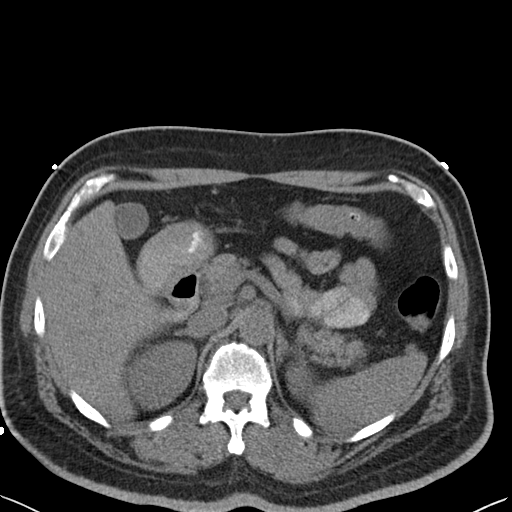
[im 81/97  soft-tissue]
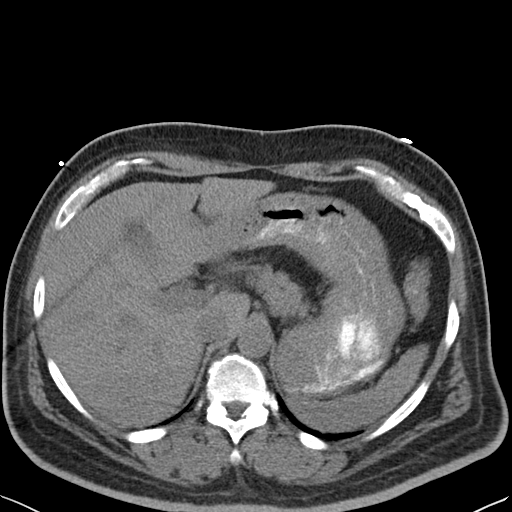
[im 91/97  soft-tissue]
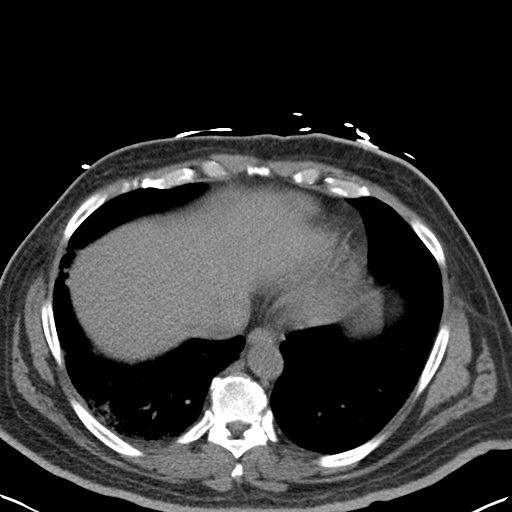

[Series 5: coronal st · coronal · 0.68mm/px · 3 of 100 slices shown]
[im 34/100  soft-tissue]
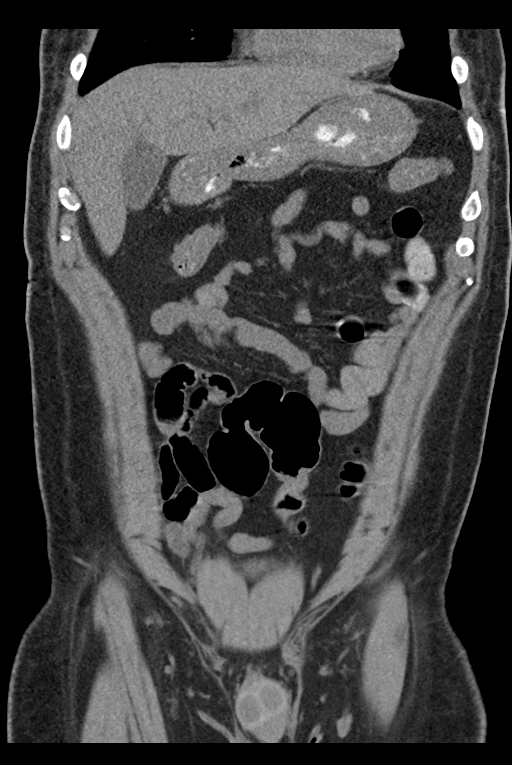
[im 45/100  soft-tissue]
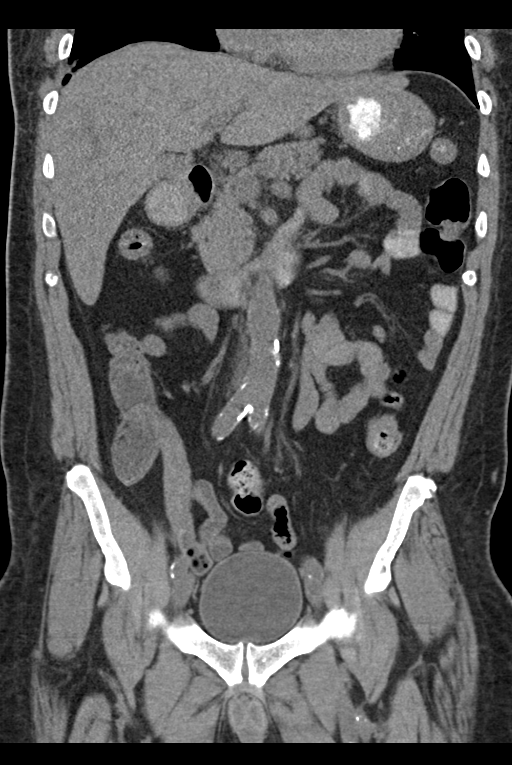
[im 56/100  soft-tissue]
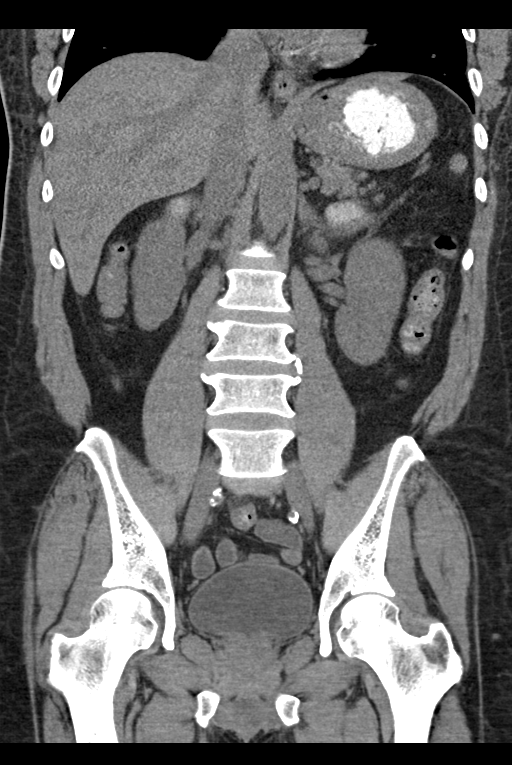

[15 of 46 positions shown; findings below may reference images not displayed]

FINDINGS: Evaluation of this exam is limited in the absence of intravenous
contrast.

Lower chest: Bibasilar subpleural interstitial coarsening/scarring
or atelectasis. Developing infiltrate at the right lung base is less
likely but not excluded. Coronary vascular calcification. There is
hypoattenuation of the cardiac blood pool suggestive of a degree of
anemia. Clinical correlation is recommended.

No intra-abdominal free air or free fluid.

Hepatobiliary: No focal liver abnormality is seen. No gallstones,
gallbladder wall thickening, or biliary dilatation.

Pancreas: Unremarkable. No pancreatic ductal dilatation or
surrounding inflammatory changes.

Spleen: Normal in size without focal abnormality.

Adrenals/Urinary Tract: The adrenal glands are unremarkable. There
is no hydronephrosis or nephrolithiasis on either side. A 9 mm
exophytic hypodense lesion from the lateral inferior pole of the
right kidney is not well characterized but may represent a cyst. The
visualized ureters and urinary bladder appear unremarkable.

Stomach/Bowel: There is no bowel obstruction or active inflammation.
Normal appendix.

Vascular/Lymphatic: Moderate aortoiliac atherosclerotic disease. No
portal venous gas. There is no adenopathy.

Reproductive: The prostate and seminal vesicles are grossly
unremarkable. No pelvic mass.

Other: Small fat containing umbilical hernia.

Musculoskeletal: No acute or significant osseous findings.
IMPRESSION: No acute intra-abdominal or pelvic pathology. No bowel obstruction
or active inflammation. Normal appendix.

## 2020-07-06 IMAGING — DX DG CHEST 1V PORT
1 series · 1 of 1 positions shown · non-contrast
Comparison: 11/02/2018 and earlier, including CT chest 10/20/2018
and PET-CT 08/21/2018.

CLINICAL DATA: 52-year-old current history of small-cell lung
cancer for which the patient is being treated with chemotherapy and
radiation therapy, presenting with acute onset of fever.

EXAM:
PORTABLE CHEST 1 VIEW

[chest ap]
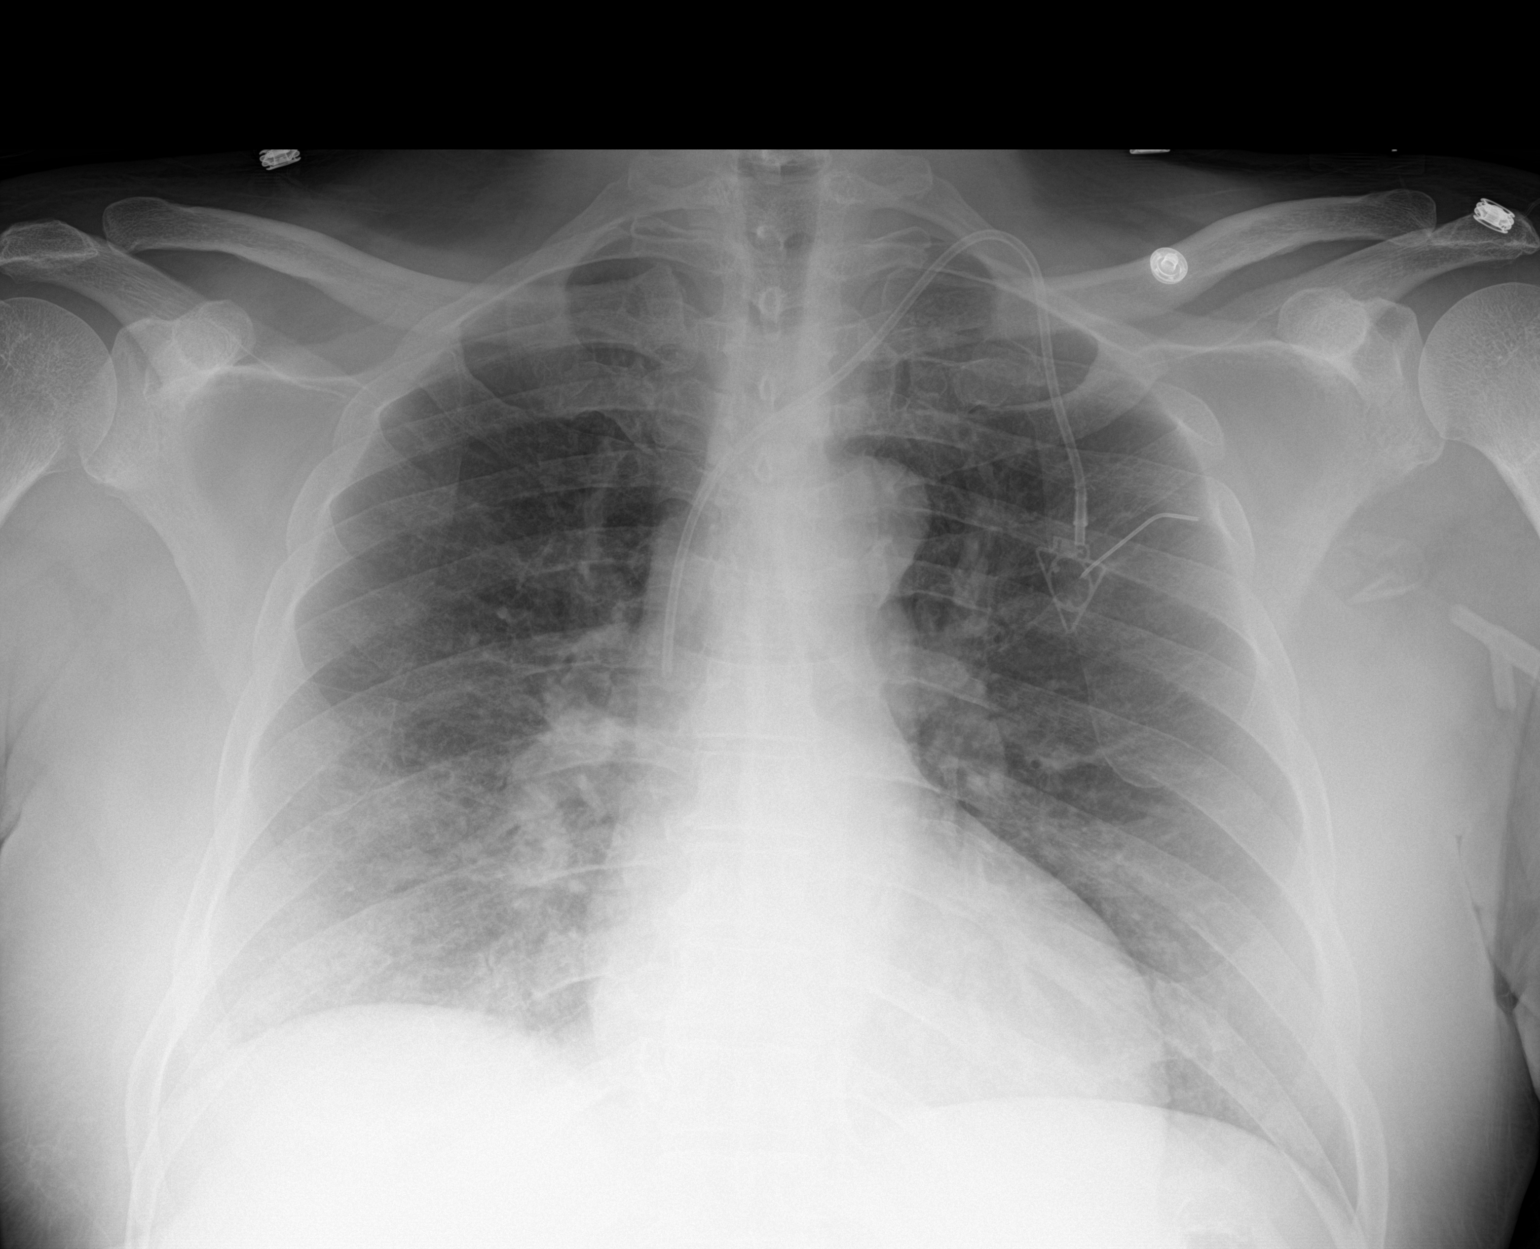

[1 of 1 positions shown; findings below may reference images not displayed]

FINDINGS: Cardiac silhouette normal in size for AP technique. Thoracic aorta
mildly tortuous and atherosclerotic, unchanged. RIGHT paratracheal
mediastinal lymphadenopathy identified on prior examinations has
resolved. LEFT jugular Port-A-Cath tip projects over the mid SVC,
unchanged.

Patchy airspace opacities at the RIGHT lung base. Lungs otherwise
clear. Pulmonary vascularity normal.
IMPRESSION: Acute pneumonia involving the RIGHT lung base.
